# Patient Record
Sex: Male | Born: 1935
Health system: Southern US, Community
[De-identification: ages and names within clinical notes are randomized; demographics above are authoritative.]

## PROBLEM LIST (undated history)

## (undated) DIAGNOSIS — E119 Type 2 diabetes mellitus without complications: Secondary | ICD-10-CM

## (undated) DIAGNOSIS — I499 Cardiac arrhythmia, unspecified: Secondary | ICD-10-CM

## (undated) DIAGNOSIS — R7303 Prediabetes: Secondary | ICD-10-CM

## (undated) DIAGNOSIS — E039 Hypothyroidism, unspecified: Secondary | ICD-10-CM

## (undated) DIAGNOSIS — M1990 Unspecified osteoarthritis, unspecified site: Secondary | ICD-10-CM

## (undated) DIAGNOSIS — I10 Essential (primary) hypertension: Secondary | ICD-10-CM

## (undated) DIAGNOSIS — E785 Hyperlipidemia, unspecified: Secondary | ICD-10-CM

## (undated) DIAGNOSIS — I509 Heart failure, unspecified: Secondary | ICD-10-CM

## (undated) DIAGNOSIS — I739 Peripheral vascular disease, unspecified: Secondary | ICD-10-CM

## (undated) DIAGNOSIS — G4730 Sleep apnea, unspecified: Secondary | ICD-10-CM

## (undated) DIAGNOSIS — N189 Chronic kidney disease, unspecified: Secondary | ICD-10-CM

## (undated) DIAGNOSIS — I219 Acute myocardial infarction, unspecified: Secondary | ICD-10-CM

## (undated) DIAGNOSIS — H409 Unspecified glaucoma: Secondary | ICD-10-CM

## (undated) HISTORY — DX: Peripheral vascular disease, unspecified: I73.9

## (undated) HISTORY — PX: HERNIA REPAIR: SHX51

## (undated) HISTORY — DX: Essential (primary) hypertension: I10

## (undated) HISTORY — DX: Hyperlipidemia, unspecified: E78.5

---
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
## 2000-12-15 ENCOUNTER — Ambulatory Visit (HOSPITAL_COMMUNITY): Admission: RE | Admit: 2000-12-15 | Discharge: 2000-12-15 | Payer: Self-pay | Admitting: General Surgery

## 2001-10-01 ENCOUNTER — Encounter: Payer: Self-pay | Admitting: Urology

## 2001-10-01 ENCOUNTER — Encounter: Admission: RE | Admit: 2001-10-01 | Discharge: 2001-10-01 | Payer: Self-pay | Admitting: Urology

## 2001-10-07 ENCOUNTER — Encounter (INDEPENDENT_AMBULATORY_CARE_PROVIDER_SITE_OTHER): Payer: Self-pay | Admitting: Specialist

## 2001-10-07 ENCOUNTER — Ambulatory Visit (HOSPITAL_BASED_OUTPATIENT_CLINIC_OR_DEPARTMENT_OTHER): Admission: RE | Admit: 2001-10-07 | Discharge: 2001-10-07 | Payer: Self-pay | Admitting: Urology

## 2002-07-14 ENCOUNTER — Other Ambulatory Visit: Admission: RE | Admit: 2002-07-14 | Discharge: 2002-07-14 | Payer: Self-pay | Admitting: Dermatology

## 2004-05-04 ENCOUNTER — Emergency Department: Payer: Self-pay | Admitting: Unknown Physician Specialty

## 2004-05-09 ENCOUNTER — Observation Stay (HOSPITAL_COMMUNITY): Admission: RE | Admit: 2004-05-09 | Discharge: 2004-05-10 | Payer: Self-pay | Admitting: Orthopedic Surgery

## 2004-06-19 ENCOUNTER — Encounter: Admission: RE | Admit: 2004-06-19 | Discharge: 2004-08-09 | Payer: Self-pay | Admitting: Orthopedic Surgery

## 2005-09-18 ENCOUNTER — Ambulatory Visit (HOSPITAL_BASED_OUTPATIENT_CLINIC_OR_DEPARTMENT_OTHER): Admission: RE | Admit: 2005-09-18 | Discharge: 2005-09-18 | Payer: Self-pay | Admitting: Urology

## 2005-12-26 ENCOUNTER — Ambulatory Visit (HOSPITAL_COMMUNITY): Admission: RE | Admit: 2005-12-26 | Discharge: 2005-12-26 | Payer: Self-pay | Admitting: *Deleted

## 2006-01-21 HISTORY — PX: US ECHOCARDIOGRAPHY: HXRAD669

## 2007-02-10 ENCOUNTER — Ambulatory Visit (HOSPITAL_COMMUNITY): Admission: RE | Admit: 2007-02-10 | Discharge: 2007-02-10 | Payer: Self-pay | Admitting: General Surgery

## 2007-10-28 ENCOUNTER — Ambulatory Visit (HOSPITAL_COMMUNITY): Admission: RE | Admit: 2007-10-28 | Discharge: 2007-10-28 | Payer: Self-pay | Admitting: Family Medicine

## 2010-01-13 ENCOUNTER — Emergency Department (HOSPITAL_COMMUNITY)
Admission: EM | Admit: 2010-01-13 | Discharge: 2010-01-13 | Payer: Self-pay | Source: Home / Self Care | Admitting: Emergency Medicine

## 2010-01-14 HISTORY — PX: CATARACT EXTRACTION: SUR2

## 2010-03-26 LAB — COMPREHENSIVE METABOLIC PANEL
ALT: 19 U/L (ref 0–53)
AST: 44 U/L — ABNORMAL HIGH (ref 0–37)
Albumin: 2.2 g/dL — ABNORMAL LOW (ref 3.5–5.2)
Alkaline Phosphatase: 144 U/L — ABNORMAL HIGH (ref 39–117)
BUN: 33 mg/dL — ABNORMAL HIGH (ref 6–23)
CO2: 34 mEq/L — ABNORMAL HIGH (ref 19–32)
Calcium: 8.6 mg/dL (ref 8.4–10.5)
Chloride: 93 mEq/L — ABNORMAL LOW (ref 96–112)
Creatinine, Ser: 1.66 mg/dL — ABNORMAL HIGH (ref 0.4–1.5)
GFR calc Af Amer: 49 mL/min — ABNORMAL LOW (ref >60)
GFR calc non Af Amer: 41 mL/min — ABNORMAL LOW (ref >60)
Glucose, Bld: 130 mg/dL — ABNORMAL HIGH (ref 70–99)
Potassium: 2.6 mEq/L — CL (ref 3.5–5.1)
Sodium: 137 mEq/L (ref 135–145)
Total Bilirubin: 2.2 mg/dL — ABNORMAL HIGH (ref 0.3–1.2)
Total Protein: 7 g/dL (ref 6.0–8.3)

## 2010-03-26 LAB — DIFFERENTIAL
Basophils Absolute: 0 10*3/uL (ref 0.0–0.1)
Basophils Relative: 0 % (ref 0–1)
Eosinophils Absolute: 0.1 10*3/uL (ref 0.0–0.7)
Eosinophils Relative: 1 % (ref 0–5)
Lymphocytes Relative: 14 % (ref 12–46)
Lymphs Abs: 1.5 10*3/uL (ref 0.7–4.0)
Monocytes Absolute: 1 10*3/uL (ref 0.1–1.0)
Monocytes Relative: 9 % (ref 3–12)
Neutro Abs: 8 10*3/uL — ABNORMAL HIGH (ref 1.7–7.7)
Neutrophils Relative %: 76 % (ref 43–77)

## 2010-03-26 LAB — CBC
HCT: 40.6 % (ref 39.0–52.0)
Hemoglobin: 13.6 g/dL (ref 13.0–17.0)
MCH: 32.7 pg (ref 26.0–34.0)
MCHC: 33.5 g/dL (ref 30.0–36.0)
MCV: 97.6 fL (ref 78.0–100.0)
Platelets: 204 10*3/uL (ref 150–400)
RBC: 4.16 MIL/uL — ABNORMAL LOW (ref 4.22–5.81)
RDW: 17.3 % — ABNORMAL HIGH (ref 11.5–15.5)
WBC: 10.5 10*3/uL (ref 4.0–10.5)

## 2010-03-26 LAB — CROSSMATCH
ABO/RH(D): O POS
Antibody Screen: NEGATIVE
Unit division: 0
Unit division: 0

## 2010-03-26 LAB — PROTIME-INR
INR: 1.82 — ABNORMAL HIGH (ref 0.00–1.49)
Prothrombin Time: 21.2 seconds — ABNORMAL HIGH (ref 11.6–15.2)

## 2010-03-26 LAB — ABO/RH: ABO/RH(D): O POS

## 2010-05-29 NOTE — H&P (Signed)
NAME:  [name], [name] NO.:  [id number]   MEDICAL RECORD NO.:  [id number]           PATIENT TYPE:  AMB   LOCATION:  DAY                           FACILITY:  [facility]   PHYSICIAN:  Jamesetta So, M.D.  DATE OF BIRTH:  1927-09-14   DATE OF ADMISSION:  DATE OF DISCHARGE:  LH                              HISTORY & PHYSICAL   CHIEF COMPLAINT:  Hematochezia.   HISTORY OF PRESENT ILLNESS:  The patient is a 77 year old white male who  is referred for endoscopic evaluation.  He needs colonoscopy for  hematochezia.  Does feel some rectal pain and tearing with bowel  movements.  There is a question of whether he has an anal fissure.  No  abdominal pain, weight loss, nausea, vomiting, diarrhea, constipation or  melena had been noted.  He last had a colonoscopy in 2001.  There is no  family history of colon carcinoma.   PAST MEDICAL HISTORY:  Hypertension, BPH.   PAST SURGICAL HISTORY:  Herniorrhaphy, appendectomy, knee surgeries.   MEDICATIONS:  Simvastatin, ranitidine, baby aspirin, terazosin.   ALLERGIES:  IVP DYE.   REVIEW OF SYSTEMS:  The patient denies drinking or smoking.  He denies  any recent chest pain, MI, CVA or diabetes mellitus.   PHYSICAL EXAMINATION:  GENERAL:  The patient is a well-developed, well-  nourished white male in no acute distress.  LUNGS:  Clear to auscultation with good breath sounds bilaterally.  HEART:  Reveals regular rate and rhythm without S3, S4 or murmurs.  ABDOMEN:  Soft, nontender, nondistended.  No hepatosplenomegaly or  masses are noted.  RECTAL:  Deferred to the procedure.   IMPRESSION:  Hematochezia.   PLAN:  The patient is scheduled for a colonoscopy on February 03, 2007.  The risks and benefits of the procedure including bleeding and  perforation were fully explained to the patient who gave informed  consent.      Jamesetta So, M.D.  Electronically Signed     MAJ/MEDQ  D:  01/22/2007  T:  01/22/2007  Job:   [id number]   cc:   Bonne Dolores, M.D.  Fax: [phone]

## 2010-06-01 NOTE — Op Note (Signed)
NAME:  [name], SEEBER NO.:  [id number]   MEDICAL RECORD NO.:  [id number]          PATIENT TYPE:  AMB   LOCATION:  DAY                          FACILITY:  [facility]   PHYSICIAN:  Gaynelle Arabian, M.D.    DATE OF BIRTH:  [birth year]   DATE OF PROCEDURE:  05/09/2004  DATE OF DISCHARGE:                                 OPERATIVE REPORT   PREOPERATIVE DIAGNOSIS:  Left quadriceps tendon rupture.   POSTOPERATIVE DIAGNOSIS:  Left quadriceps tendon rupture.   PROCEDURE:  Left quadriceps tendon repair.   SURGEON:  Gaynelle Arabian, M.D.   ASSISTANT:  Arlee Muslim, PA-C.   ANESTHESIA:  General.   ESTIMATED BLOOD LOSS:  Minimal.   DRAINS:  None.   TOURNIQUET TIME:  Thirty-nine minutes at 300 mmHg.   COMPLICATIONS:  None.   CONDITION:  Stable to the recovery room.   CLINICAL NOTE:  [name] is a 77 year old male who accidentally stepped in a  hole about five days ago, sustaining a left quadriceps tendon rupture.  I  saw him in the office yesterday, and he presents today for a quadriceps  tendon repair.   PROCEDURE IN DETAIL:  After successful administration of general anesthetic,  a tourniquet is placed high on the left thigh, and the left lower extremity  prepped and draped in the usual sterile fashion.  The extremity is wrapped  in esmarch, the knee flexed, and the tourniquet inflated to 300 mmHg.  A  standard midline incision was made with a 10 blade through the subcutaneous  tissue to the level of the extensor mechanism.  A complete quad rupture at  the tendo-osseous junction, between the quad tendon and the patella.  We  debrided abnormal-appearing tissue.  I then placed two #2 fiber wire sutures  in Bunnell fashion through the quad tendon to exit distally.  I drilled  three holes in the patella from superior to inferior and then passed the  sutures through the holes.  We tightened down the sutures and tied them to  give a very stable quadriceps repair.  I was able  to give him full flexion  without any gapping in the repair.  We had thoroughly irrigated the joint  prior to tying these down.  The medial and lateral retinaculums were then  closed with interrupted Ethibond suture.  Again, flexion against gravity and  he had full flexion with no gapping of the repair.  We then let the  tourniquet down for a total time of 39 minutes.  The subcu was closed with  interrupted  2-0 Vicryl and the subcuticular with a running 4-0 Monocryl.  The incision  was cleaned and dried.  Steri-Strips and a bulky sterile dressing applied.  He was subsequently awakened and transported to the recovery room in stable  condition.      FA/MEDQ  D:  05/09/2004  T:  05/09/2004  Job:  [id number]

## 2010-06-01 NOTE — Op Note (Signed)
NAME:  [name], [name] NO.:  [id number]   MEDICAL RECORD NO.:  [id number]          PATIENT TYPE:  AMB   LOCATION:  [facility]                         FACILITY:  [facility]   PHYSICIAN:  Lucina Mellow. Terance Hart, M.D.DATE OF BIRTH:  08-18-1927   DATE OF PROCEDURE:  09/18/2005  DATE OF DISCHARGE:                                 OPERATIVE REPORT   PREOPERATIVE DIAGNOSIS:  Urethral stricture and benign prostatic  hypertrophy.   POSTOPERATIVE DIAGNOSIS:  Urethral stricture and benign prostatic  hypertrophy.   PROCEDURES:  Cystoscopy and urethral dilation with filiforms and followers.   SURGEON:  Dr. Hessie Diener   ANESTHESIA:  General.   INDICATIONS:  This gentleman has bladder obstructive symptoms and has been  on the therapy with terazosin and still has significant voiding symptoms.  Office cystoscopy revealed an impassable bulbous urethral stricture.  He is  brought to the OR today for therapy under anesthesia.   PROCEDURE:  The patient was brought to operating room and placed in  lithotomy position.  External genitalia were prepped and draped in usual  fashion.  He was cystoscoped and the anterior urethra was normal until I got  to the bulbous region when there was a small pinpoint stricture.  Utilizing  the cystoscope, I passed a metal guidewire through this strictured area and  over the metal guidewire passed came [facility] dilators from 16-French to 12-  [place].  I then cystoscoped him and fortunately the bulbous urethral  stricture was fairly short and he had bilobar hypertrophy of the prostate  there would be amenable to TUR of prostate if needed and the bladder had  trabeculation but no stones, tumors or inflammatory lesions.  Th scope was  then removed.  The patient was sent to the recovery room in good condition  having tolerated the procedure well.      Lucina Mellow. Terance Hart, M.D.  Electronically Signed     LJP/MEDQ  D:  09/18/2005  T:  09/18/2005  Job:  [id number]

## 2010-06-01 NOTE — Op Note (Signed)
   NAME:  [name], [name] NO.:  [id number]   MEDICAL RECORD NO.:  [id number]                   PATIENT TYPE:  AMB   LOCATION:  [facility]                                 FACILITY:  [facility]   PHYSICIAN:  Lucina Mellow. Terance Hart, M.D.             DATE OF BIRTH:  12/02/1927   DATE OF PROCEDURE:  10/07/2001  DATE OF DISCHARGE:                                 OPERATIVE REPORT   PREOPERATIVE DIAGNOSIS:  Rule out carcinoma in situ of the bladder.   POSTOPERATIVE DIAGNOSES:  1. Rule out carcinoma in situ of the bladder.  2. Bulbous urethral stricture.   PROCEDURE:  1. Cystoscopy.  2. Urethral dilation.  3. Cold cup bladder biopsy and fulguration.   SURGEON:  Lucina Mellow. Terance Hart, M.D.   ANESTHESIA:  General.   INDICATIONS:  This 77 year old gentleman had some bloody urethral discharge,  and cystoscopy in the office revealed a couple of erythematous areas  worrisome for CIS.  He is brought to the OR today for further treatment and  evaluation.   DESCRIPTION OF PROCEDURE:  The patient was brought to operating room and  placed in lithotomy position.  The external genitalia were prepped and  draped in the usual fashion.  He was cystoscoped, and he had a mild bulbous  urethral stricture which I needed to dilate with Leander Rams sound before I  can insert the 25 [place] cystoscope.  The prostate was unremarkable with  partial obstruction.  The bladder had mild trabeculation and an erythematous  area on the anterior wall and on the right upper wall.  They were both about  1.5 cm in size.  The cold  cup bladder biopsy forceps was utilized to biopsy  both areas and sent separately to pathology.  The biopsy sites and residual  erythematous mucosa in both regions were then fulgurated with the Bugbee  electrode.  At this point, there was good hemostasis and negligible blood  loss and, as the scope was removed, the bulbous urethral stricture was  noted.  He was then taken to the  recovery room in good condition having  tolerated the procedure well.                                               Lucina Mellow. Terance Hart, M.D.    LJP/MEDQ  D:  10/07/2001  T:  10/07/2001  Job:  [id number]

## 2010-06-05 HISTORY — PX: NM MYOCAR PERF WALL MOTION: HXRAD629

## 2010-06-08 ENCOUNTER — Encounter (HOSPITAL_COMMUNITY): Payer: Self-pay

## 2010-06-08 ENCOUNTER — Ambulatory Visit (HOSPITAL_COMMUNITY)
Admission: RE | Admit: 2010-06-08 | Discharge: 2010-06-08 | Disposition: A | Discharge status: Home / Self Care | Payer: Medicare Other | Source: Ambulatory Visit | Attending: Cardiovascular Disease | Admitting: Cardiovascular Disease

## 2010-06-08 ENCOUNTER — Other Ambulatory Visit (HOSPITAL_COMMUNITY): Payer: Self-pay | Admitting: Cardiovascular Disease

## 2010-06-08 DIAGNOSIS — Z01810 Encounter for preprocedural cardiovascular examination: Secondary | ICD-10-CM

## 2010-06-08 DIAGNOSIS — R0602 Shortness of breath: Secondary | ICD-10-CM | POA: Insufficient documentation

## 2010-06-14 ENCOUNTER — Ambulatory Visit (HOSPITAL_COMMUNITY)
Admission: RE | Admit: 2010-06-14 | Discharge: 2010-06-15 | Disposition: A | Discharge status: Home / Self Care | Payer: Medicare Other | Source: Ambulatory Visit | Attending: Cardiovascular Disease | Admitting: Cardiovascular Disease

## 2010-06-14 DIAGNOSIS — Z01812 Encounter for preprocedural laboratory examination: Secondary | ICD-10-CM | POA: Insufficient documentation

## 2010-06-14 DIAGNOSIS — Z01810 Encounter for preprocedural cardiovascular examination: Secondary | ICD-10-CM | POA: Insufficient documentation

## 2010-06-14 DIAGNOSIS — I701 Atherosclerosis of renal artery: Secondary | ICD-10-CM | POA: Insufficient documentation

## 2010-06-14 DIAGNOSIS — I70219 Atherosclerosis of native arteries of extremities with intermittent claudication, unspecified extremity: Secondary | ICD-10-CM | POA: Insufficient documentation

## 2010-06-14 DIAGNOSIS — I700 Atherosclerosis of aorta: Secondary | ICD-10-CM | POA: Insufficient documentation

## 2010-06-14 DIAGNOSIS — I10 Essential (primary) hypertension: Secondary | ICD-10-CM | POA: Insufficient documentation

## 2010-06-14 DIAGNOSIS — E785 Hyperlipidemia, unspecified: Secondary | ICD-10-CM | POA: Insufficient documentation

## 2010-06-14 HISTORY — PX: OTHER SURGICAL HISTORY: SHX169

## 2010-06-14 LAB — POCT ACTIVATED CLOTTING TIME: Activated Clotting Time: 211 seconds

## 2010-06-14 LAB — GLUCOSE, CAPILLARY
Glucose-Capillary: 120 mg/dL — ABNORMAL HIGH (ref 70–99)
Glucose-Capillary: 127 mg/dL — ABNORMAL HIGH (ref 70–99)
Glucose-Capillary: 167 mg/dL — ABNORMAL HIGH (ref 70–99)

## 2010-06-15 LAB — BASIC METABOLIC PANEL
BUN: 20 mg/dL (ref 6–23)
CO2: 27 mEq/L (ref 19–32)
Calcium: 8.9 mg/dL (ref 8.4–10.5)
Chloride: 103 mEq/L (ref 96–112)
Creatinine, Ser: 1.39 mg/dL (ref 0.4–1.5)
GFR calc Af Amer: 59 mL/min — ABNORMAL LOW (ref >60)
GFR calc non Af Amer: 49 mL/min — ABNORMAL LOW (ref >60)
Glucose, Bld: 136 mg/dL — ABNORMAL HIGH (ref 70–99)
Potassium: 4 mEq/L (ref 3.5–5.1)
Sodium: 137 mEq/L (ref 135–145)

## 2010-06-15 LAB — CBC
HCT: 36.1 % — ABNORMAL LOW (ref 39.0–52.0)
Hemoglobin: 12.7 g/dL — ABNORMAL LOW (ref 13.0–17.0)
MCH: 33.2 pg (ref 26.0–34.0)
MCHC: 35.2 g/dL (ref 30.0–36.0)
MCV: 94.3 fL (ref 78.0–100.0)
Platelets: 166 10*3/uL (ref 150–400)
RBC: 3.83 MIL/uL — ABNORMAL LOW (ref 4.22–5.81)
RDW: 12.8 % (ref 11.5–15.5)
WBC: 8.4 10*3/uL (ref 4.0–10.5)

## 2010-06-15 LAB — POCT ACTIVATED CLOTTING TIME: Activated Clotting Time: 175 seconds

## 2010-06-15 LAB — GLUCOSE, CAPILLARY: Glucose-Capillary: 141 mg/dL — ABNORMAL HIGH (ref 70–99)

## 2010-06-18 LAB — GLUCOSE, CAPILLARY: Glucose-Capillary: 125 mg/dL — ABNORMAL HIGH (ref 70–99)

## 2010-06-27 NOTE — Procedures (Signed)
NAME:  [name], [name] NO.:  [id number]  MEDICAL RECORD NO.:  [id number]           PATIENT TYPE:  O  LOCATION:  6525                         FACILITY:  [facility]  PHYSICIAN:  [name], M.D.   DATE OF BIRTH:  08/20/1927  DATE OF PROCEDURE: DATE OF DISCHARGE:                   PERIPHERAL VASCULAR INVASIVE PROCEDURE   Abdominal aortogram with bifemoral runoff, selective right and left renal artery angiogram, PTA and stent left SFA.  [name] is an 77 year old gentleman referred by Dr. Debara Pickett for angiography and potential intervention because of symptomatic claudication.  His risk factors include hypertension and hyperlipidemia. He did have a negative Myoview.  Dopplers performed in our office revealed ABIs in the 0.6 range bilaterally with high-grade distal SFA stenosis and severe tibial disease.  PROCEDURE DESCRIPTION:  The patient was brought to the second floor of [facility] in the postabsorptive state.  He was premedicated with p.o. Valium, IV Versed and fentanyl.  His right groin was prepped and shaved in the usual sterile fashion.  Xylocaine 1% was used for local anesthesia.  A 5-French sheath was inserted into the right femoral artery using standard Seldinger technique.  A 5-French pigtail catheter was used for abdominal aortography with bifemoral runoff using bolus-chase digital subtraction step-table technique.  A 5- French short right Judkins catheter was used for selective right and left renal artery angiography.  Visipaque dye was used for the entirety of the case.  Retrograde aortic pressure was monitored at the end of the case.  ANGIOGRAPHIC RESULT: 1. Abdominal aorta.     a.     Renal arteries - 30% to 40% bilateral ostial renal artery      stenosis.     b.     Infrarenal abdominal aorta - mild atherosclerotic changes. 2. Left lower extremity.     a.     A 90% focal mid to distal left SFA stenosis.     b.     A  zero-vessel runoff.  The peroneal appeared to be the      dominant vessel and there was a short segment of occlusion filled      by collaterals. 3. Right lower extremity.     a.     A zero-vessel runoff below the knee.  PROCEDURE DESCRIPTION:  Contralateral access was obtained with a crossover catheter, 0.035 Rosen wire, and a 7-French Ansel crossover sheath.  The patient received 5000 units of heparin intravenously with an ACT of 211 at the end of the case.  Visipaque dye was used for the entirety of the case.  Retrograde aortic pressure was monitored at the end of the case.  A 0.035 Versacore wire was used to cross the lesion.  Predilatation was performed with a 4 x 2 balloon, stenting with a 7 x 3 Abbott Absolute Pro nitinol self-expanding stent.  Postdilatation with a 6 x 3 balloon resulting in reduction of 90% focal mid to distal left SFA stenosis to 0% residual without dissection.  The patient tolerated the procedure well.  Total contrast used during the case was 244.  The sheath was withdrawn across the bifurcation.  The patient  left the lab in stable condition.  Sheath will be removed once the ACT falls below 170.  The patient will hydrated overnight and discharged home in the morning.  We will get followup Dopplers and will see Dr. Debara Pickett back in followup.     [name], M.D.     Geralynn Rile  D:  06/14/2010  T:  06/15/2010  Job:  [id number]  cc:   [facility]. Mali Hilty, MD Sherrilee Gilles. Gerarda Fraction, MD  Electronically Signed by [name] M.D. on 06/27/2010 10:45:22 AM

## 2010-06-27 NOTE — Discharge Summary (Signed)
  NAME:  [name], [name] NO.:  [id number]  MEDICAL RECORD NO.:  [id number]           PATIENT TYPE:  O  LOCATION:  6525                         FACILITY:  [facility]  PHYSICIAN:  [name], M.D.   DATE OF BIRTH:  01/02/1928  DATE OF ADMISSION:  06/14/2010 DATE OF DISCHARGE:  06/15/2010                              DISCHARGE SUMMARY   DISCHARGE DIAGNOSES: 1. Claudication without improvement with medical therapy. 2. Abnormal ABIs. 3. Peripheral arterial disease with 90% left superficial femoral     artery stenosis undergoing PTA and stent with good results. 4. Renal artery stenosis with bilateral 30-40% stenosis.  DISCHARGE CONDITION:  Stable.  PROCEDURES: 1. PV angiogram Jun 14, 2010 by Dr. Gwenlyn Found. 2. Jun 14, 2010, PTA and stent to the left SFA by Dr. Gwenlyn Found.  Right     lower extremity had 0 vessel runoff below the knee.  PAST MEDICAL HISTORY:  Hypertension and BPH.  PAST SURGICAL HISTORY:  Herniorrhaphy, appendectomy, and knee surgery.  The patient was seen by Dr. Debara Pickett with complaints of claudication.  ABIs were abnormal with 0.61 on the left and 0.65 on the right demonstrated occlusive arterial disease.  Pletal was tried but did not help with claudication at all.  PV angiogram was recommended as he had failed medical therapy.  The patient underwent PV angiogram with results as stated previously.  By June 15, 2010, she is stable.  Does have some numbness, tingling in his feet bilaterally.  Plans are to have him ambulate in the hall.  If he remained stable, we will discharge home and follow up as an outpatient initially with Dr. Gwenlyn Found and then with Dr. Debara Pickett as his routine cardiology physician.  DISCHARGE INSTRUCTIONS: 1. Increase activity slowly.  May shower.  No lifting for 1 week.  No     driving for 2 days. 2. Heart-healthy diet. 3. Wash cath site with soap and water.  Call if any bleeding,     swelling, or drainage. 4. Follow up with Dr. Gwenlyn Found in  [place], July 03, 2010 at 3:30. 5. Cancelled appointment in [place] with Dr. Debara Pickett. 6. You are scheduled for followup Doppler's June 25, 2010 at 10:00     a.m. in [place].  After these 2 appointments, she will then     follow up with Dr. Debara Pickett.  DISCHARGE MEDICATIONS:  See medication reconciliation sheet.  No medications were changed as the patient was already on Plavix.  He had already been discontinued his Pletal and we continued these.     Otilio Carpen. Dorene Ar, N.P.   ______________________________ [name], M.D.    LRI/MEDQ  D:  06/15/2010  T:  06/15/2010  Job:  [id number]  cc:   [name], M.D. Sherrilee Gilles. Gerarda Fraction, MD Mali Hilty, MD  Electronically Signed by Cecilie Kicks N.P. on 06/23/2010 03:21:43 PM Electronically Signed by [name] M.D. on 06/27/2010 10:45:25 AM

## 2011-12-11 ENCOUNTER — Other Ambulatory Visit (HOSPITAL_COMMUNITY): Payer: Self-pay | Admitting: Nephrology

## 2011-12-11 DIAGNOSIS — N289 Disorder of kidney and ureter, unspecified: Secondary | ICD-10-CM

## 2011-12-16 ENCOUNTER — Ambulatory Visit (HOSPITAL_COMMUNITY)
Admission: RE | Admit: 2011-12-16 | Discharge: 2011-12-16 | Disposition: A | Discharge status: Home / Self Care | Payer: Medicare Other | Source: Ambulatory Visit | Attending: Nephrology | Admitting: Nephrology

## 2011-12-16 DIAGNOSIS — E119 Type 2 diabetes mellitus without complications: Secondary | ICD-10-CM | POA: Insufficient documentation

## 2011-12-16 DIAGNOSIS — N289 Disorder of kidney and ureter, unspecified: Secondary | ICD-10-CM

## 2011-12-16 DIAGNOSIS — N189 Chronic kidney disease, unspecified: Secondary | ICD-10-CM | POA: Insufficient documentation

## 2012-01-07 ENCOUNTER — Ambulatory Visit (INDEPENDENT_AMBULATORY_CARE_PROVIDER_SITE_OTHER): Payer: Medicare Other | Admitting: General Surgery

## 2012-02-26 ENCOUNTER — Other Ambulatory Visit (HOSPITAL_COMMUNITY): Payer: Self-pay | Admitting: Cardiovascular Disease

## 2012-02-26 DIAGNOSIS — I779 Disorder of arteries and arterioles, unspecified: Secondary | ICD-10-CM

## 2012-02-26 DIAGNOSIS — I70219 Atherosclerosis of native arteries of extremities with intermittent claudication, unspecified extremity: Secondary | ICD-10-CM

## 2012-03-19 ENCOUNTER — Encounter (HOSPITAL_COMMUNITY): Payer: Medicare Other

## 2012-04-10 ENCOUNTER — Encounter (HOSPITAL_COMMUNITY): Payer: Medicare Other

## 2012-04-17 ENCOUNTER — Ambulatory Visit (HOSPITAL_COMMUNITY)
Admission: RE | Admit: 2012-04-17 | Discharge: 2012-04-17 | Disposition: A | Discharge status: Home / Self Care | Payer: Medicare Other | Source: Ambulatory Visit | Attending: Cardiovascular Disease | Admitting: Cardiovascular Disease

## 2012-04-17 DIAGNOSIS — I70219 Atherosclerosis of native arteries of extremities with intermittent claudication, unspecified extremity: Secondary | ICD-10-CM

## 2012-04-17 DIAGNOSIS — I779 Disorder of arteries and arterioles, unspecified: Secondary | ICD-10-CM

## 2012-04-17 NOTE — Progress Notes (Signed)
Lower extremity arterial complete.  [place]

## 2012-06-24 ENCOUNTER — Other Ambulatory Visit: Payer: Self-pay | Admitting: Cardiology

## 2012-06-26 ENCOUNTER — Other Ambulatory Visit: Payer: Self-pay | Admitting: *Deleted

## 2012-06-26 MED ORDER — ATORVASTATIN CALCIUM 40 MG PO TABS: 40.0000 mg | ORAL_TABLET | Freq: Every day | ORAL | Status: DC

## 2012-08-03 ENCOUNTER — Ambulatory Visit: Payer: Medicare Other | Admitting: Internal Medicine

## 2012-08-04 ENCOUNTER — Ambulatory Visit (INDEPENDENT_AMBULATORY_CARE_PROVIDER_SITE_OTHER): Payer: Medicare Other | Admitting: Internal Medicine

## 2012-08-04 ENCOUNTER — Encounter: Payer: Self-pay | Admitting: Internal Medicine

## 2012-08-04 VITALS — BP 140/72 | HR 47 | Ht 71.5 in | Wt 230.9 lb

## 2012-08-04 DIAGNOSIS — R42 Dizziness and giddiness: Secondary | ICD-10-CM | POA: Insufficient documentation

## 2012-08-04 DIAGNOSIS — I10 Essential (primary) hypertension: Secondary | ICD-10-CM

## 2012-08-04 DIAGNOSIS — I739 Peripheral vascular disease, unspecified: Secondary | ICD-10-CM

## 2012-08-04 DIAGNOSIS — E785 Hyperlipidemia, unspecified: Secondary | ICD-10-CM

## 2012-08-04 NOTE — Progress Notes (Signed)
OFFICE NOTE  Chief Complaint:  Routine follow-up  Primary Care Physician: Glo Herring., MD  HPI:  [name]  is an 79 year old gentleman with history of hypertension, dyslipidemia and peripheral vascular disease. He did have claudication and in May 2012 he had high-grade mid SFA stenosis. He had a stent placed in his SFA which improved his claudication and his Doppler studies. In December 2012 his left ABI was 0.61, the right ABI 0.74 with a patent stent. He was seen back by Dr. Gwenlyn Found and recommended followup with me in the office today. His only complaints now are about occasional dizziness especially with change in position, also leg pain with walking but not claudication, mostly joint pain, hip pain which could represent arthritis, wearing glasses.   PMHx:  Past Medical History  Diagnosis Date  . PVD (peripheral vascular disease)   . Hypertension   . Hyperlipidemia     Past Surgical History  Procedure Laterality Date  . Hernia repair    . Appendectomy  1943  . Cataract extraction  2012    x2  . Stent  06/14/2010    left leg    FAMHx:  Family History  Problem Relation Age of Onset  . Cancer Mother   . Heart attack Father   . Stroke Father   . Heart attack Brother   . Heart attack Brother     SOCHx:   reports that he quit smoking about 77 years ago. He has quit using smokeless tobacco. His smokeless tobacco use included Chew. He reports that he does not drink alcohol or use illicit drugs.  ALLERGIES:  Allergies  Allergen Reactions  . Iohexol      Code: RASH, Desc: PT STATES HOT ALL OVER HAD TO HAVE ICE PACKS ALL OVER HIM AND DIFF BREATHING, PT REFUSED IV DYE     ROS: A comprehensive review of systems was negative except for: Musculoskeletal: positive for stiff joints  HOME MEDS: Current Outpatient Prescriptions  Medication Sig Dispense Refill  . aspirin 81 MG tablet Take 81 mg by mouth daily.      [name] atorvastatin (LIPITOR) 40 MG tablet Take 1 tablet  (40 mg total) by mouth daily.  30 tablet  6  . cholecalciferol (VITAMIN D) 1000 UNITS tablet Take 1,000 Units by mouth daily.      . Cinnamon 500 MG TABS Take 2 tablets by mouth daily.      . dorzolamide-timolol (COSOPT) 22.3-6.8 MG/ML ophthalmic solution Place 1 drop into both eyes 2 (two) times daily.      . finasteride (PROSCAR) 5 MG tablet Take 5 mg by mouth daily.      . fish oil-omega-3 fatty acids 1000 MG capsule Take 2 g by mouth daily.      [name] glucosamine-chondroitin 500-400 MG tablet Take 1 tablet by mouth 2 (two) times daily.      [name] lisinopril (PRINIVIL,ZESTRIL) 10 MG tablet Take 10 mg by mouth daily.      . Methylcellulose, Laxative, (FIBER THERAPY PO) Take by mouth daily.      [name] pyridOXINE (VITAMIN B-6) 100 MG tablet Take 100 mg by mouth daily.      . ranitidine (ZANTAC) 150 MG tablet Take 150 mg by mouth 2 (two) times daily.      . tamsulosin (FLOMAX) 0.4 MG CAPS Take 0.4 mg by mouth daily.      . vitamin C (ASCORBIC ACID) 500 MG tablet Take 500 mg by mouth daily.      [name]  vitamin E 400 UNIT capsule Take 400 Units by mouth daily.      [name] zinc gluconate 50 MG tablet Take 50 mg by mouth daily.      . clopidogrel (PLAVIX) 75 MG tablet take 1 tablet by mouth once daily  30 tablet  6   No current facility-administered medications for this visit.    LABS/IMAGING: No results found for this or any previous visit (from the past 48 hour(s)). No results found.  VITALS: BP 140/72  Pulse 47  Ht 5' 11.5" (1.816 m)  Wt 230 lb 14.4 oz (104.736 kg)  BMI 31.76 kg/m2  EXAM: General appearance: alert and no distress Neck: no adenopathy, no carotid bruit, no JVD, supple, symmetrical, trachea midline and thyroid not enlarged, symmetric, no tenderness/mass/nodules Lungs: clear to auscultation bilaterally Heart: regular rate and rhythm, S1, S2 normal, no murmur, click, rub or gallop Abdomen: soft, non-tender; bowel sounds normal; no masses,  no organomegaly Extremities: extremities normal,  atraumatic, no cyanosis or edema Pulses: 1+ bilaterally Skin: Skin color, texture, turgor normal. No rashes or lesions Neurologic: Grossly normal  EKG: Sinus bradycardia 47  ASSESSMENT: 1. PAD status post right SFA stent 2. Hypertension 3. Dizziness 4. Dyslipidemia   PLAN: 1.   Mr. Ayson is doing well with regards to his PAD. He did have repeat Dopplers in April 2014 which showed a moderately reduced ABI of the right around 0.6 and a mild to moderately reduced ABI on the left around 0.8.  He denies any claudication, but does get some mild shortness of breath walking up stairs and has pain mostly in his right hip. I didn't think this is an orthopedic problem and have recommended he sees an orthopedist for it. Continue current medications and we'll see him back in a year.  Pixie Casino, MD, [facility] Attending Cardiologist [facility] 08/04/2012, 1:35 PM

## 2012-08-04 NOTE — Patient Instructions (Addendum)
Your physician recommends that you return for lab work in: 1-2 weeks. You will need to be fasting for this bloodwork.  NMR with lipids.  Your physician wants you to follow-up in: 1 year. You will receive a reminder letter in the mail two months in advance. If you don't receive a letter, please call our office to schedule the follow-up appointment.

## 2012-08-07 LAB — NMR LIPOPROFILE WITH LIPIDS
Cholesterol, Total: 99 mg/dL (ref <200)
HDL Particle Number: 26.3 umol/L — ABNORMAL LOW (ref >=30.5)
HDL Size: 8.8 nm — ABNORMAL LOW (ref >=9.2)
HDL-C: 39 mg/dL — ABNORMAL LOW (ref >=40)
LDL (calc): 42 mg/dL (ref <100)
LDL Particle Number: 614 nmol/L (ref <1000)
LDL Size: 20.6 nm (ref >20.5)
LP-IR Score: 64 — ABNORMAL HIGH (ref <=45)
Large HDL-P: 3.6 umol/L — ABNORMAL LOW (ref >=4.8)
Large VLDL-P: 2.8 nmol/L — ABNORMAL HIGH (ref <=2.7)
Small LDL Particle Number: 229 nmol/L (ref <=527)
Triglycerides: 89 mg/dL (ref <150)
VLDL Size: 53.8 nm — ABNORMAL HIGH (ref <=46.6)

## 2012-08-26 ENCOUNTER — Other Ambulatory Visit: Payer: Self-pay

## 2012-08-26 MED ORDER — LISINOPRIL 10 MG PO TABS: 10.0000 mg | ORAL_TABLET | Freq: Every day | ORAL | Status: DC

## 2012-08-26 NOTE — Telephone Encounter (Signed)
Rx was sent to pharmacy electronically. 

## 2013-02-06 ENCOUNTER — Other Ambulatory Visit: Payer: Self-pay | Admitting: Cardiovascular Disease

## 2013-02-08 ENCOUNTER — Other Ambulatory Visit: Payer: Self-pay

## 2013-02-08 MED ORDER — CLOPIDOGREL BISULFATE 75 MG PO TABS: 75.0000 mg | ORAL_TABLET | Freq: Every day | ORAL | Status: DC

## 2013-02-08 NOTE — Telephone Encounter (Signed)
Rx was sent to pharmacy electronically. 

## 2013-02-19 ENCOUNTER — Encounter (HOSPITAL_COMMUNITY): Payer: Self-pay | Admitting: Pharmacy Technician

## 2013-02-19 NOTE — H&P (Signed)
NTS SOAP Note  Vital Signs:  Vitals as of: 123XX123: Systolic Q000111Q: Diastolic 83: Heart Rate 65: Temp 97.8F: Height 59ft 11in: Weight 229Lbs 0 Ounces: Pain Level 4: BMI 31.94  BMI : 31.94 kg/m2  Subjective: This 80 Years 24 Months old Male presents for of a hernia.  Has been having intermittent right groin pain and swelling for the past six months.  Made worse with straining.  No nausea, vomiting noted.  Review of Symptoms:  Constitutional:unremarkable   Head:unremarkable    Eyes:unremarkable   Nose/Mouth/Throat:unremarkable Cardiovascular:  unremarkable   Respiratory:unremarkable   Gastrointestin    heartburn Genitourinary:unremarkable       joint pain Skin:unremarkable Hematolgic/Lymphatic:unremarkable     Allergic/Immunologic:unremarkable     Past Medical History:    Reviewed   Past Medical History  Surgical History: hernia, appendectomy Medical Problems: HTN, high cholesterol, reflux, NIDDM Allergies: iv contrast Medications: tamsulosum, clopidogrel, atorvastatin, ranitidine, finasteride, glipizide, baby asa   Social History:Reviewed  Social History  Preferred Language: English Race:  White Ethnicity: Not Hispanic / Latino Age: 80 Years 5 Months Marital Status:  M Alcohol: no Recreational drug(s): no   Smoking Status: Never smoker reviewed on 01/26/2013 Functional Status reviewed on 01/26/2013 ------------------------------------------------ Bathing: Normal Cooking: Normal Dressing: Normal Driving: Normal Eating: Normal Managing Meds: Normal Oral Care: Normal Shopping: Normal Toileting: Normal Transferring: Normal Walking: Normal Cognitive Status reviewed on 01/26/2013 ------------------------------------------------ Attention: Normal Decision Making: Normal Language: Normal Memory: Normal Motor: Normal Perception: Normal Problem Solving: Normal Visual and Spatial: Normal   Family History:   Reviewed  Family Health History Family History is Unknown    Objective Information: General:  Well appearing, well nourished in no distress. Heart:  RRR, no murmur Lungs:    CTA bilaterally, no wheezes, rhonchi, rales.  Breathing unlabored. Abdomen:Soft, NT/ND, no HSM, no masses.  Reducible right inguinal hernia noted. [id number]    Assessment:Right inguinal hernia    Plan:  As patient is symptomatic, will need right inguinal herniorrhaphy with mesh.  Patient will call to scheduled.   Patient Education:Alternative treatments to surgery were discussed with patient (and family).  Risks and benefits  of procedure including bleeding, infection, mesh use, and recurrence of the hernia were fully explained to the patient (and family) who gave informed consent. Patient/family questions were addressed.  Follow-up:Pending Surgery

## 2013-02-22 ENCOUNTER — Encounter (HOSPITAL_COMMUNITY): Payer: Self-pay

## 2013-02-22 NOTE — Patient Instructions (Signed)
[name]  02/22/2013   Your procedure is scheduled on:  03/01/2013  Report to [facility] at  32  AM.  Call this number if you have problems the morning of surgery: [phone]   Remember:   Do not eat food or drink liquids after midnight.   Take these medicines the morning of surgery with A SIP OF WATER:  Proscar, zantac, flomax   Do not wear jewelry, make-up or nail polish.  Do not wear lotions, powders, or perfumes.   Do not shave 48 hours prior to surgery. Men may shave face and neck.  Do not bring valuables to the hospital.  [facility] is not responsible for any belongings or valuables.               Contacts, dentures or bridgework may not be worn into surgery.  Leave suitcase in the car. After surgery it may be brought to your room.  For patients admitted to the hospital, discharge time is determined by yourtreatment team.               Patients discharged the day of surgery will not be allowed to drive home.  Name and phone number of your driver: family  Special Instructions: Shower using CHG 2 nights before surgery and the night before surgery.  If you shower the day of surgery use CHG.  Use special wash - you have one bottle of CHG for all showers.  You should use approximately 1/3 of the bottle for each shower.   Please read over the following fact sheets that you were given: Pain Booklet, Coughing and Deep Breathing, Surgical Site Infection Prevention, Anesthesia Post-op Instructions and Care and Recovery After Surgery Hernia A hernia happens when an organ inside your body pushes out through a weak spot in your belly (abdominal) wall. Most hernias get worse over time. They can often be pushed back into place (reduced). Surgery may be needed to repair hernias that cannot be pushed into place. HOME CARE  Keep doing normal activities.  Avoid lifting more than 10 pounds (4.5 kilograms).  Cough gently and avoid straining. Over time, these things will:  Increase  your hernia size.  Irritate your hernia.  Break down hernia repairs.  Stop smoking.  Do not wear anything tight over your hernia. Do not keep the hernia in with an outside bandage.  Eat food that is high in fiber (fruit, vegetables, whole grains).  Drink enough fluids to keep your pee (urine) clear or pale yellow.  Take medicines to make your poop soft (stool softeners) if you cannot poop (constipated). GET HELP RIGHT AWAY IF:   You have a fever.  You have belly pain that gets worse.  You feel sick to your stomach (nauseous) and throw up (vomit).  Your skin starts to bulge out.  Your hernia turns a different color, feels hard, or is tender.  You have increased pain or puffiness (swelling) around the hernia.  You poop more or less often.  Your poop does not look the way normally does.  You have watery poop (diarrhea).  You cannot push the hernia back in place by applying gentle pressure while lying down. MAKE SURE YOU:   Understand these instructions.  Will watch your condition.  Will get help right away if you are not doing well or get worse. Document Released: 06/20/2009 Document Revised: 03/25/2011 Document Reviewed: 06/20/2009 [facility] Patient Information 2014 [place]. PATIENT INSTRUCTIONS POST-ANESTHESIA  IMMEDIATELY FOLLOWING  SURGERY:  Do not drive or operate machinery for the first twenty four hours after surgery.  Do not make any important decisions for twenty four hours after surgery or while taking narcotic pain medications or sedatives.  If you develop intractable nausea and vomiting or a severe headache please notify your doctor immediately.  FOLLOW-UP:  Please make an appointment with your surgeon as instructed. You do not need to follow up with anesthesia unless specifically instructed to do so.  WOUND CARE INSTRUCTIONS (if applicable):  Keep a dry clean dressing on the anesthesia/puncture wound site if there is drainage.  Once the wound has  quit draining you may leave it open to air.  Generally you should leave the bandage intact for twenty four hours unless there is drainage.  If the epidural site drains for more than 36-48 hours please call the anesthesia department.  QUESTIONS?:  Please feel free to call your physician or the hospital operator if you have any questions, and they will be happy to assist you.

## 2013-02-23 ENCOUNTER — Encounter (HOSPITAL_COMMUNITY)
Admission: RE | Admit: 2013-02-23 | Discharge: 2013-02-23 | Disposition: A | Discharge status: Home / Self Care | Payer: Medicare Other | Source: Ambulatory Visit | Attending: General Surgery | Admitting: General Surgery

## 2013-02-23 ENCOUNTER — Encounter (HOSPITAL_COMMUNITY): Payer: Self-pay

## 2013-02-23 ENCOUNTER — Other Ambulatory Visit: Payer: Self-pay

## 2013-02-23 DIAGNOSIS — Z01812 Encounter for preprocedural laboratory examination: Secondary | ICD-10-CM | POA: Insufficient documentation

## 2013-02-23 DIAGNOSIS — Z01810 Encounter for preprocedural cardiovascular examination: Secondary | ICD-10-CM | POA: Insufficient documentation

## 2013-02-23 HISTORY — DX: Prediabetes: R73.03

## 2013-02-23 HISTORY — DX: Unspecified glaucoma: H40.9

## 2013-02-23 HISTORY — DX: Unspecified osteoarthritis, unspecified site: M19.90

## 2013-02-23 HISTORY — DX: Sleep apnea, unspecified: G47.30

## 2013-02-23 LAB — CBC WITH DIFFERENTIAL/PLATELET
Basophils Absolute: 0 10*3/uL (ref 0.0–0.1)
Basophils Relative: 1 % (ref 0–1)
Eosinophils Absolute: 0.2 10*3/uL (ref 0.0–0.7)
Eosinophils Relative: 3 % (ref 0–5)
HCT: 38.8 % — ABNORMAL LOW (ref 39.0–52.0)
Hemoglobin: 13.5 g/dL (ref 13.0–17.0)
Lymphocytes Relative: 23 % (ref 12–46)
Lymphs Abs: 1.9 10*3/uL (ref 0.7–4.0)
MCH: 34.1 pg — ABNORMAL HIGH (ref 26.0–34.0)
MCHC: 34.8 g/dL (ref 30.0–36.0)
MCV: 98 fL (ref 78.0–100.0)
Monocytes Absolute: 0.7 10*3/uL (ref 0.1–1.0)
Monocytes Relative: 9 % (ref 3–12)
Neutro Abs: 5.6 10*3/uL (ref 1.7–7.7)
Neutrophils Relative %: 66 % (ref 43–77)
Platelets: 210 10*3/uL (ref 150–400)
RBC: 3.96 MIL/uL — ABNORMAL LOW (ref 4.22–5.81)
RDW: 13.2 % (ref 11.5–15.5)
WBC: 8.5 10*3/uL (ref 4.0–10.5)

## 2013-02-23 LAB — BASIC METABOLIC PANEL
BUN: 19 mg/dL (ref 6–23)
CO2: 29 mEq/L (ref 19–32)
Calcium: 9.8 mg/dL (ref 8.4–10.5)
Chloride: 99 mEq/L (ref 96–112)
Creatinine, Ser: 1.41 mg/dL — ABNORMAL HIGH (ref 0.50–1.35)
GFR calc Af Amer: 52 mL/min — ABNORMAL LOW (ref >90)
GFR calc non Af Amer: 44 mL/min — ABNORMAL LOW (ref >90)
Glucose, Bld: 160 mg/dL — ABNORMAL HIGH (ref 70–99)
Potassium: 4.3 mEq/L (ref 3.7–5.3)
Sodium: 139 mEq/L (ref 137–147)

## 2013-03-01 ENCOUNTER — Ambulatory Visit (HOSPITAL_COMMUNITY): Payer: Medicare Other | Admitting: Anesthesiology

## 2013-03-01 ENCOUNTER — Ambulatory Visit (HOSPITAL_COMMUNITY)
Admission: RE | Admit: 2013-03-01 | Discharge: 2013-03-01 | Disposition: A | Discharge status: Home / Self Care | Payer: Medicare Other | Source: Ambulatory Visit | Attending: General Surgery | Admitting: General Surgery

## 2013-03-01 ENCOUNTER — Encounter (HOSPITAL_COMMUNITY)
Admission: RE | Disposition: A | Discharge status: Home / Self Care | Payer: Self-pay | Source: Ambulatory Visit | Attending: General Surgery

## 2013-03-01 ENCOUNTER — Encounter (HOSPITAL_COMMUNITY): Payer: Self-pay | Admitting: Anesthesiology

## 2013-03-01 ENCOUNTER — Encounter (HOSPITAL_COMMUNITY): Payer: Medicare Other

## 2013-03-01 DIAGNOSIS — K4090 Unilateral inguinal hernia, without obstruction or gangrene, not specified as recurrent: Secondary | ICD-10-CM | POA: Insufficient documentation

## 2013-03-01 DIAGNOSIS — Z87891 Personal history of nicotine dependence: Secondary | ICD-10-CM | POA: Insufficient documentation

## 2013-03-01 DIAGNOSIS — I10 Essential (primary) hypertension: Secondary | ICD-10-CM | POA: Insufficient documentation

## 2013-03-01 DIAGNOSIS — Z79899 Other long term (current) drug therapy: Secondary | ICD-10-CM | POA: Insufficient documentation

## 2013-03-01 HISTORY — PX: INSERTION OF MESH: SHX5868

## 2013-03-01 HISTORY — PX: INGUINAL HERNIA REPAIR: SHX194

## 2013-03-01 LAB — GLUCOSE, CAPILLARY
Glucose-Capillary: 141 mg/dL — ABNORMAL HIGH (ref 70–99)
Glucose-Capillary: 135 mg/dL — ABNORMAL HIGH (ref 70–99)

## 2013-03-01 SURGERY — REPAIR, HERNIA, INGUINAL, ADULT
Anesthesia: Monitor Anesthesia Care | Site: Groin | Laterality: Right

## 2013-03-01 MED ORDER — FENTANYL CITRATE 0.05 MG/ML IJ SOLN
INTRAMUSCULAR | Status: AC
  Filled 2013-03-01: qty 2

## 2013-03-01 MED ORDER — FENTANYL CITRATE 0.05 MG/ML IJ SOLN
INTRAMUSCULAR | Status: DC | PRN
  Administered 2013-03-01: 20 ug via INTRATHECAL
  Administered 2013-03-01: 25 ug via INTRAVENOUS
  Administered 2013-03-01: 30 ug via INTRAVENOUS
  Administered 2013-03-01: 25 ug via INTRAVENOUS

## 2013-03-01 MED ORDER — SODIUM CHLORIDE 0.9 % IR SOLN
Status: DC | PRN
  Administered 2013-03-01: 1000 mL

## 2013-03-01 MED ORDER — EPHEDRINE SULFATE 50 MG/ML IJ SOLN
INTRAMUSCULAR | Status: DC | PRN
  Administered 2013-03-01: 10 mg via INTRAVENOUS

## 2013-03-01 MED ORDER — CEFAZOLIN SODIUM-DEXTROSE 2-3 GM-% IV SOLR
INTRAVENOUS | Status: AC
  Filled 2013-03-01: qty 50

## 2013-03-01 MED ORDER — MIDAZOLAM HCL 2 MG/2ML IJ SOLN
INTRAMUSCULAR | Status: AC
  Filled 2013-03-01: qty 2

## 2013-03-01 MED ORDER — ONDANSETRON HCL 4 MG/2ML IJ SOLN
INTRAMUSCULAR | Status: AC
  Filled 2013-03-01: qty 2

## 2013-03-01 MED ORDER — LIDOCAINE HCL (PF) 1 % IJ SOLN
INTRAMUSCULAR | Status: AC
  Filled 2013-03-01: qty 5

## 2013-03-01 MED ORDER — KETOROLAC TROMETHAMINE 30 MG/ML IJ SOLN
30.0000 mg | Freq: Once | INTRAMUSCULAR | Status: AC
  Administered 2013-03-01: 30 mg via INTRAVENOUS
  Filled 2013-03-01: qty 1

## 2013-03-01 MED ORDER — FENTANYL CITRATE 0.05 MG/ML IJ SOLN: 25.0000 ug | INTRAMUSCULAR | Status: DC | PRN

## 2013-03-01 MED ORDER — ARTIFICIAL TEARS OP OINT
TOPICAL_OINTMENT | OPHTHALMIC | Status: AC
  Filled 2013-03-01: qty 3.5

## 2013-03-01 MED ORDER — GLYCOPYRROLATE 0.2 MG/ML IJ SOLN
INTRAMUSCULAR | Status: AC
  Filled 2013-03-01: qty 1

## 2013-03-01 MED ORDER — LACTATED RINGERS IV SOLN
INTRAVENOUS | Status: DC
  Administered 2013-03-01: 1000 mL via INTRAVENOUS

## 2013-03-01 MED ORDER — ONDANSETRON HCL 4 MG/2ML IJ SOLN
4.0000 mg | Freq: Once | INTRAMUSCULAR | Status: DC | PRN
Start: 2013-03-01 — End: 2013-03-01

## 2013-03-01 MED ORDER — HYDROCODONE-ACETAMINOPHEN 5-325 MG PO TABS: 1.0000 | ORAL_TABLET | Freq: Four times a day (QID) | ORAL | Status: DC | PRN

## 2013-03-01 MED ORDER — PROPOFOL 10 MG/ML IV BOLUS
INTRAVENOUS | Status: AC
  Filled 2013-03-01: qty 20

## 2013-03-01 MED ORDER — LIDOCAINE HCL (CARDIAC) 10 MG/ML IV SOLN
INTRAVENOUS | Status: DC | PRN
  Administered 2013-03-01: 50 mg via INTRAVENOUS

## 2013-03-01 MED ORDER — BUPIVACAINE HCL (PF) 0.5 % IJ SOLN
INTRAMUSCULAR | Status: AC
  Filled 2013-03-01: qty 30

## 2013-03-01 MED ORDER — LACTATED RINGERS IV SOLN
INTRAVENOUS | Status: DC | PRN
  Administered 2013-03-01: 07:00:00 via INTRAVENOUS

## 2013-03-01 MED ORDER — FENTANYL CITRATE 0.05 MG/ML IJ SOLN
25.0000 ug | INTRAMUSCULAR | Status: AC
  Administered 2013-03-01 (×2): 25 ug via INTRAVENOUS

## 2013-03-01 MED ORDER — LIDOCAINE IN DEXTROSE 5-7.5 % IV SOLN
INTRAVENOUS | Status: DC | PRN
  Administered 2013-03-01: 2 mL via INTRATHECAL

## 2013-03-01 MED ORDER — ONDANSETRON HCL 4 MG/2ML IJ SOLN
4.0000 mg | Freq: Once | INTRAMUSCULAR | Status: AC
  Administered 2013-03-01: 4 mg via INTRAVENOUS

## 2013-03-01 MED ORDER — SODIUM CHLORIDE BACTERIOSTATIC 0.9 % IJ SOLN
INTRAMUSCULAR | Status: AC
  Filled 2013-03-01: qty 10

## 2013-03-01 MED ORDER — MIDAZOLAM HCL 2 MG/2ML IJ SOLN
1.0000 mg | INTRAMUSCULAR | Status: DC | PRN
  Administered 2013-03-01 (×2): 1 mg via INTRAVENOUS

## 2013-03-01 MED ORDER — CEFAZOLIN SODIUM-DEXTROSE 2-3 GM-% IV SOLR
2.0000 g | INTRAVENOUS | Status: AC
  Administered 2013-03-01: 2 g via INTRAVENOUS

## 2013-03-01 MED ORDER — GLYCOPYRROLATE 0.2 MG/ML IJ SOLN
0.2000 mg | Freq: Once | INTRAMUSCULAR | Status: AC
  Administered 2013-03-01: 0.2 mg via INTRAVENOUS

## 2013-03-01 MED ORDER — BUPIVACAINE HCL (PF) 0.5 % IJ SOLN
INTRAMUSCULAR | Status: DC | PRN
  Administered 2013-03-01: 10 mL

## 2013-03-01 MED ORDER — CHLORHEXIDINE GLUCONATE 4 % EX LIQD: 1.0000 "application " | Freq: Once | CUTANEOUS | Status: DC

## 2013-03-01 MED ORDER — LIDOCAINE IN DEXTROSE 5-7.5 % IV SOLN
INTRAVENOUS | Status: AC
Start: 2013-03-01 — End: 2013-03-01
  Filled 2013-03-01: qty 2

## 2013-03-01 MED ORDER — EPHEDRINE SULFATE 50 MG/ML IJ SOLN
INTRAMUSCULAR | Status: AC
  Filled 2013-03-01: qty 1

## 2013-03-01 MED ORDER — SUCCINYLCHOLINE CHLORIDE 20 MG/ML IJ SOLN
INTRAMUSCULAR | Status: AC
  Filled 2013-03-01: qty 1

## 2013-03-01 MED ORDER — PROPOFOL INFUSION 10 MG/ML OPTIME
INTRAVENOUS | Status: DC | PRN
  Administered 2013-03-01: 50 ug/kg/min via INTRAVENOUS

## 2013-03-01 SURGICAL SUPPLY — 41 items
BAG HAMPER (MISCELLANEOUS) ×2 IMPLANT
CLOTH BEACON ORANGE TIMEOUT ST (SAFETY) ×2 IMPLANT
COVER LIGHT HANDLE STERIS (MISCELLANEOUS) ×4 IMPLANT
DECANTER SPIKE VIAL GLASS SM (MISCELLANEOUS) ×2 IMPLANT
DERMABOND ADVANCED (GAUZE/BANDAGES/DRESSINGS) ×1
DERMABOND ADVANCED .7 DNX12 (GAUZE/BANDAGES/DRESSINGS) ×1 IMPLANT
DRAIN PENROSE 3/4X12 (DRAIN) ×2 IMPLANT
ELECT REM PT RETURN 9FT ADLT (ELECTROSURGICAL) ×2
ELECTRODE REM PT RTRN 9FT ADLT (ELECTROSURGICAL) ×1 IMPLANT
FORMALIN 10 PREFIL 120ML (MISCELLANEOUS) IMPLANT
GLOVE BIOGEL PI IND STRL 7.0 (GLOVE) ×2 IMPLANT
GLOVE BIOGEL PI IND STRL 8 (GLOVE) ×1 IMPLANT
GLOVE BIOGEL PI INDICATOR 7.0 (GLOVE) ×2
GLOVE BIOGEL PI INDICATOR 8 (GLOVE) ×1
GLOVE ECLIPSE 6.5 STRL STRAW (GLOVE) ×2 IMPLANT
GLOVE ECLIPSE 7.0 STRL STRAW (GLOVE) ×4 IMPLANT
GLOVE ECLIPSE 7.5 STRL STRAW (GLOVE) ×2 IMPLANT
GLOVE EXAM NITRILE MD LF STRL (GLOVE) ×2 IMPLANT
GOWN STRL REUS W/TWL LRG LVL3 (GOWN DISPOSABLE) ×6 IMPLANT
INST SET MINOR GENERAL (KITS) ×2 IMPLANT
KIT ROOM TURNOVER APOR (KITS) ×2 IMPLANT
MANIFOLD NEPTUNE II (INSTRUMENTS) ×2 IMPLANT
MESH HERNIA 1.6X1.9 PLUG LRG (Mesh General) ×1 IMPLANT
MESH HERNIA PLUG LRG (Mesh General) ×1 IMPLANT
NS IRRIG 1000ML POUR BTL (IV SOLUTION) ×2 IMPLANT
PACK MINOR (CUSTOM PROCEDURE TRAY) ×2 IMPLANT
PAD ARMBOARD 7.5X6 YLW CONV (MISCELLANEOUS) ×2 IMPLANT
SET BASIN LINEN APH (SET/KITS/TRAYS/PACK) ×2 IMPLANT
SOL PREP PROV IODINE SCRUB 4OZ (MISCELLANEOUS) ×2 IMPLANT
SUT NOVA NAB GS-22 2 2-0 T-19 (SUTURE) ×6 IMPLANT
SUT NOVAFIL NAB HGS22 2-0 30IN (SUTURE) IMPLANT
SUT SILK 3 0 (SUTURE)
SUT SILK 3-0 18XBRD TIE 12 (SUTURE) IMPLANT
SUT VIC AB 2-0 CT1 27 (SUTURE) ×1
SUT VIC AB 2-0 CT1 TAPERPNT 27 (SUTURE) ×1 IMPLANT
SUT VIC AB 3-0 SH 27 (SUTURE) ×1
SUT VIC AB 3-0 SH 27X BRD (SUTURE) ×1 IMPLANT
SUT VIC AB 4-0 PS2 27 (SUTURE) ×2 IMPLANT
SUT VICRYL AB 3 0 TIES (SUTURE) ×2 IMPLANT
SYR CONTROL 10ML LL (SYRINGE) ×2 IMPLANT
TOWEL OR 17X26 4PK STRL BLUE (TOWEL DISPOSABLE) IMPLANT

## 2013-03-01 NOTE — Anesthesia Preprocedure Evaluation (Addendum)
Anesthesia Evaluation  Patient identified by MRN, date of birth, ID band Patient awake    Reviewed: Allergy & Precautions, H&P , NPO status , Patient's Chart, lab work & pertinent test results  Airway Mallampati: III TM Distance: <3 FB Neck ROM: Full    Dental  (+) Edentulous Upper, Edentulous Lower   Pulmonary sleep apnea , former smoker,  breath sounds clear to auscultation        Cardiovascular hypertension, Pt. on medications + Peripheral Vascular Disease Rhythm:Regular Rate:Bradycardia     Neuro/Psych    GI/Hepatic negative GI ROS,   Endo/Other    Renal/GU      Musculoskeletal   Abdominal   Peds  Hematology   Anesthesia Other Findings   Reproductive/Obstetrics                          Anesthesia Physical Anesthesia Plan  ASA: III  Anesthesia Plan: Spinal and MAC   Post-op Pain Management:    Induction: Intravenous  Airway Management Planned: Nasal Cannula  Additional Equipment:   Intra-op Plan:   Post-operative Plan:   Informed Consent: I have reviewed the patients History and Physical, chart, labs and discussed the procedure including the risks, benefits and alternatives for the proposed anesthesia with the patient or authorized representative who has indicated his/her understanding and acceptance.     Plan Discussed with:   Anesthesia Plan Comments:         Anesthesia Quick Evaluation

## 2013-03-01 NOTE — Discharge Instructions (Addendum)
Spinal Anesthesia Care After HOME CARE INSTRUCTIONS  Do not drive or operate machinery for at least 24 hours after receiving anesthesia. Make sure someone is available to drive you home.  Do not drink alcohol for at least 24 hours after receiving anesthesia.  Do not make important decisions for 24 hours after having spinal or epidural anesthesia. Your thinking may be unclear.  Have someone stay with you for at least 24 to 48 hours following surgery.  Drink lots of fluids when you get home. If you are an adult, drink eight, 8 ounce glasses of water per day, or as directed.  Keep your post-operative appointments as suggested. SEEK IMMEDIATE MEDICAL CARE IF:   You develop a fever or any temperature over 100.4 F (38 C).  You have a persistent or severe headache.  You develop blurred or double vision.  You develop dizziness, fainting or lightheadedness.  You have weakness, numbness, or tingling in your arms or legs.  You develop a skin rash.  You have difficulty breathing.  You have persistent nausea and vomiting.  You are unable to pass urine. Document Released: 03/23/2003 Document Revised: 03/25/2011 Document Reviewed: 02/03/2007 [facility] Patient Information 2014 [place], [place]. Inguinal Hernia, Adult  Care After Refer to this sheet in the next few weeks. These discharge instructions provide you with general information on caring for yourself after you leave the hospital. Your caregiver may also give you specific instructions. Your treatment has been planned according to the most current medical practices available, but unavoidable complications sometimes occur. If you have any problems or questions after discharge, please call your caregiver. HOME CARE INSTRUCTIONS  Put ice on the operative site.  Put ice in a plastic bag.  Place a towel between your skin and the bag.  Leave the ice on for 15-20 minutes at a time, 03-04 times a day while awake.  Change bandages  (dressings) as directed.  Keep the wound dry and clean. The wound may be washed gently with soap and water. Gently blot or dab the wound dry. It is okay to take showers 24 to 48 hours after surgery. Do not take baths, use swimming pools, or use hot tubs for 10 days, or as directed by your caregiver.  Only take over-the-counter or prescription medicines for pain, discomfort, or fever as directed by your caregiver.  Continue your normal diet as directed.  Do not lift anything more than 10 pounds or play contact sports for 3 weeks, or as directed. SEEK MEDICAL CARE IF:  There is redness, swelling, or increasing pain in the wound.  There is fluid (pus) coming from the wound.  There is drainage from a wound lasting longer than 1 day.  You have an oral temperature above 102 F (38.9 C).  You notice a bad smell coming from the wound or dressing.  The wound breaks open after the stitches (sutures) have been removed.  You notice increasing pain in the shoulders (shoulder strap areas).  You develop dizzy episodes or fainting while standing.  You feel sick to your stomach (nauseous) or throw up (vomit). SEEK IMMEDIATE MEDICAL CARE IF:  You develop a rash.  You have difficulty breathing.  You develop a reaction or have side effects to medicines you were given. MAKE SURE YOU:   Understand these instructions.  Will watch your condition.  Will get help right away if you are not doing well or get worse. Document Released: 01/31/2006 Document Revised: 03/25/2011 Document Reviewed: 11/30/2008 [employer] Patient Information 2014 [place]. ° °

## 2013-03-01 NOTE — Interval H&P Note (Signed)
History and Physical Interval Note:  03/01/2013 7:23 AM  [name]  has presented today for surgery, with the diagnosis of right inguinal hernia  The various methods of treatment have been discussed with the patient and family. After consideration of risks, benefits and other options for treatment, the patient has consented to  Procedure(s): HERNIA REPAIR INGUINAL ADULT WITH MESH (Right) as a surgical intervention .  The patient's history has been reviewed, patient examined, no change in status, stable for surgery.  I have reviewed the patient's chart and labs.  Questions were answered to the patient's satisfaction.     Aviva Signs A

## 2013-03-01 NOTE — Preoperative (Signed)
Beta Blockers   Reason not to administer Beta Blockers:Not Applicable 

## 2013-03-01 NOTE — Anesthesia Postprocedure Evaluation (Signed)
  Anesthesia Post-op Note  Patient: [name]  Procedure(s) Performed: Procedure(s): HERNIA REPAIR INGUINAL ADULT WITH MESH (Right) INSERTION OF MESH (Right)  Patient Location: PACU  Anesthesia Type:Spinal  Level of Consciousness: awake, alert , oriented and patient cooperative  Airway and Oxygen Therapy: Patient Spontanous Breathing and Patient connected to face mask oxygen  Post-op Pain: none  Post-op Assessment: Post-op Vital signs reviewed, Patient's Cardiovascular Status Stable, Respiratory Function Stable, Patent Airway, No signs of Nausea or vomiting and Pain level controlled  Post-op Vital Signs: Reviewed and stable  Complications: No apparent anesthesia complications

## 2013-03-01 NOTE — Anesthesia Procedure Notes (Addendum)
Date/Time: 03/01/2013 7:57 AM Performed by: Andree Elk, AMY A Pre-anesthesia Checklist: Patient identified, Timeout performed, Emergency Drugs available, Suction available and Patient being monitored Oxygen Delivery Method: Non-rebreather mask   Spinal  Patient location during procedure: OR Start time: 03/01/2013 7:57 AM Staffing Anesthesiologist: Lerry Liner CRNA/Resident: ADAMS, AMY A Preanesthetic Checklist Completed: patient identified, site marked, surgical consent, pre-op evaluation, timeout performed, IV checked, risks and benefits discussed and monitors and equipment checked Spinal Block Patient position: right lateral decubitus Prep: Betadine Patient monitoring: heart rate, cardiac monitor, continuous pulse ox and blood pressure Approach: right paramedian Location: L3-4 Injection technique: single-shot Needle Needle type: Spinocan  Needle gauge: 22 G Needle length: 9 cm Assessment Sensory level: T8 Additional Notes ATTEMPTS:2 TRAY [id number] TRAY EXPIRATION DATE:01/2014  Attempt spinal x1 by Adams,CRNA; Spinal placed by Dr. Duwayne Heck; Lido 5% 56ml and Fentanyl 20 mcg injected intrathecally at 0757. Patient tolerated well.

## 2013-03-01 NOTE — Op Note (Signed)
Patient:  [name]  DOB:  02-24-1927  MRN:  [id number]   Preop Diagnosis:  Right inguinal hernia  Postop Diagnosis:  Same  Procedure:  Right inguinal herniorrhaphy with mesh  Surgeon:  Aviva Signs, M.D.  Anes:  Spinal  Indications:  Patient is an 80 year old white male who presents with a symptomatic right inguinal hernia. The risks and benefits of the procedure including bleeding, infection, cardiopulmonary difficulties, the use of mesh, and the possibility of recurrence of the hernia were fully explained to the patient, who gave informed consent.  Procedure note:  The patient was placed in the supine position after spinal anesthesia was administered. The right groin region was prepped and draped using usual sterile technique with Betadine. Surgical site confirmation was performed.  A transverse incision was made in the right groin region down to the external oblique aponeuroses. Aponeuroses was incised to the external ring. A Penrose drain was placed around the spermatic cord. The vas deferens was noted from the spermatic cord. The ilioinguinal nerve is identified and retracted superiorly from the operative field. A lipoma of the cord was found and this was ligated and removed using a 3-0 Vicryl tie. It was disposed of. The patient had a large direct hernia sac. This was incised at its space and a large Bard PerFix plug was then inserted and secured circumferentially to the transversalis fascia using 2-0 Novafil interrupted sutures. An onlay patch was then placed along the floor of inguinal canal and secured superiorly to the conjoined tendon and inferiorly to the shelving edge of Poupart's ligament using a 2-0 Novafil interrupted suture. The internal ring was recreated using a 2-0 Novafil suture. The external oblique aponeuroses was reapproximated using a 2-0 Vicryl running suture. The subcutaneous layer was reapproximated using 3-0 Vicryl interrupted suture. The skin was closed using a  4 Vicryl subcuticular suture. 0.5% Sensorcaine was instilled the surrounding wound. Dermabond was then applied.  All tape and needle counts were correct the end of the procedure. Patient was transferred to PACU in stable condition.  Complications:  None  EBL:  Minimal  Specimen:  None

## 2013-03-01 NOTE — Transfer of Care (Signed)
Immediate Anesthesia Transfer of Care Note  Patient: [name]  Procedure(s) Performed: Procedure(s): HERNIA REPAIR INGUINAL ADULT WITH MESH (Right) INSERTION OF MESH (Right)  Patient Location: PACU  Anesthesia Type:Spinal  Level of Consciousness: awake, alert , oriented and patient cooperative  Airway & Oxygen Therapy: Patient Spontanous Breathing and Patient connected to face mask oxygen  Post-op Assessment: Report given to PACU RN and Post -op Vital signs reviewed and stable  Post vital signs: Reviewed and stable  Complications: No apparent anesthesia complications

## 2013-03-02 ENCOUNTER — Encounter (HOSPITAL_COMMUNITY): Payer: Self-pay | Admitting: General Surgery

## 2013-04-15 ENCOUNTER — Other Ambulatory Visit (HOSPITAL_COMMUNITY): Payer: Self-pay | Admitting: Cardiovascular Disease

## 2013-04-15 ENCOUNTER — Telehealth (HOSPITAL_COMMUNITY): Payer: Self-pay | Admitting: *Deleted

## 2013-04-15 DIAGNOSIS — I739 Peripheral vascular disease, unspecified: Secondary | ICD-10-CM

## 2013-04-27 ENCOUNTER — Ambulatory Visit (HOSPITAL_COMMUNITY)
Admission: RE | Admit: 2013-04-27 | Discharge: 2013-04-27 | Disposition: A | Discharge status: Home / Self Care | Payer: Medicare Other | Source: Ambulatory Visit | Attending: Cardiovascular Disease | Admitting: Cardiovascular Disease

## 2013-04-27 DIAGNOSIS — I739 Peripheral vascular disease, unspecified: Secondary | ICD-10-CM | POA: Insufficient documentation

## 2013-04-27 NOTE — Progress Notes (Signed)
Lower Extremity Arterial Duplex Completed. °Brianna L Mazza,RVT °

## 2013-09-06 ENCOUNTER — Other Ambulatory Visit: Payer: Self-pay | Admitting: Internal Medicine

## 2013-09-06 NOTE — Telephone Encounter (Signed)
Rx was sent to pharmacy electronically. 

## 2013-11-21 ENCOUNTER — Other Ambulatory Visit: Payer: Self-pay | Admitting: Internal Medicine

## 2013-11-22 NOTE — Telephone Encounter (Signed)
Authorized 1 more refill; patient needs appt. Has been over a year since visit with Dr. Debara Pickett

## 2014-01-17 ENCOUNTER — Other Ambulatory Visit: Payer: Self-pay | Admitting: Internal Medicine

## 2014-01-17 DIAGNOSIS — L570 Actinic keratosis: Secondary | ICD-10-CM | POA: Diagnosis not present

## 2014-01-17 DIAGNOSIS — L821 Other seborrheic keratosis: Secondary | ICD-10-CM | POA: Diagnosis not present

## 2014-01-21 NOTE — Telephone Encounter (Signed)
LMTCB, patient needs appointment for refills

## 2014-01-24 ENCOUNTER — Telehealth: Payer: Self-pay | Admitting: Internal Medicine

## 2014-01-24 NOTE — Telephone Encounter (Signed)
Pt is coming in for his overdue annual appt and he will need some labs done. Please place the order and inform pt that the order has been put in and where he can have these labs done.   Thanks

## 2014-01-24 NOTE — Telephone Encounter (Signed)
Spoke with pt, he has an appointment on Thursday this week. He will come fasting to that appt so labs can be drawn then. Pt agreed with this plan.

## 2014-01-27 ENCOUNTER — Ambulatory Visit (INDEPENDENT_AMBULATORY_CARE_PROVIDER_SITE_OTHER): Payer: Medicare Other | Admitting: Internal Medicine

## 2014-01-27 ENCOUNTER — Encounter: Payer: Self-pay | Admitting: Internal Medicine

## 2014-01-27 VITALS — BP 150/70 | HR 58 | Ht 71.0 in | Wt 240.1 lb

## 2014-01-27 DIAGNOSIS — E785 Hyperlipidemia, unspecified: Secondary | ICD-10-CM

## 2014-01-27 DIAGNOSIS — I739 Peripheral vascular disease, unspecified: Secondary | ICD-10-CM | POA: Diagnosis not present

## 2014-01-27 DIAGNOSIS — I10 Essential (primary) hypertension: Secondary | ICD-10-CM | POA: Diagnosis not present

## 2014-01-27 MED ORDER — LISINOPRIL 10 MG PO TABS: 10.0000 mg | ORAL_TABLET | Freq: Every day | ORAL | Status: DC

## 2014-01-27 MED ORDER — ATORVASTATIN CALCIUM 40 MG PO TABS: 40.0000 mg | ORAL_TABLET | Freq: Every day | ORAL | Status: DC

## 2014-01-27 MED ORDER — CLOPIDOGREL BISULFATE 75 MG PO TABS: ORAL_TABLET | ORAL | Status: DC

## 2014-01-27 NOTE — Patient Instructions (Addendum)
Your physician wants you to follow-up in: 1 year. You will receive a reminder letter in the mail two months in advance. If you don't receive a letter, please call our office to schedule the follow-up appointment.  Please restart your lisinopril.   Your refills have been sent to your pharmacy   You will have lower extremity arterial dopplers in April. We will call you to set this up.

## 2014-01-27 NOTE — Progress Notes (Signed)
OFFICE NOTE  Chief Complaint:  Routine follow-up  Primary Care Physician: Glo Herring., MD  HPI:  [name]  is an 81 year old gentleman with history of hypertension, dyslipidemia and peripheral vascular disease. He did have claudication and in May 2012 he had high-grade mid SFA stenosis. He had a stent placed in his SFA which improved his claudication and his Doppler studies. In December 2012 his left ABI was 0.61, the right ABI 0.74 with a patent stent. He was seen back by Dr. Gwenlyn Found and recommended followup with me in the office today. His only complaints now are about occasional dizziness especially with change in position, also leg pain with walking but not claudication, mostly joint pain, hip pain which could represent arthritis, wearing glasses.   I the pleasure seeing [name] back today. It's been over a year and a half since I saw him last in the office. He reports doing fairly well and denies any chest pain or worsening shortness of breath. He denies any claudication in his legs. Blood pressure is noted to be elevated somewhat today. He is previous film lisinopril and this was stopped for unknown reasons. Not on aspirin but continues to take Plavix for his PAD.  PMHx:  Past Medical History  Diagnosis Date  . PVD (peripheral vascular disease)   . Hypertension   . Hyperlipidemia   . Glaucoma   . Borderline diabetic   . Sleep apnea     Cpap ordered but doesnt use  . Arthritis     Past Surgical History  Procedure Laterality Date  . Appendectomy  1943  . Cataract extraction  2012    x2  . Stent  06/14/2010    left leg  . Hernia repair Left   . Inguinal hernia repair Right 03/01/2013    Procedure: RIGHT INGUINAL HERNIORRHAPHY;  Surgeon: Jamesetta So, MD;  Location: [facility];  Service: General;  Laterality: Right;  . Insertion of mesh Right 03/01/2013    Procedure: INSERTION OF MESH;  Surgeon: Jamesetta So, MD;  Location: [facility];  Service: General;   Laterality: Right;    FAMHx:  Family History  Problem Relation Age of Onset  . Cancer Mother   . Heart attack Father   . Stroke Father   . Heart attack Brother   . Heart attack Brother     SOCHx:   reports that he quit smoking about 79 years ago. He has quit using smokeless tobacco. His smokeless tobacco use included Chew. He reports that he does not drink alcohol or use illicit drugs.  ALLERGIES:  Allergies  Allergen Reactions  . Iohexol      Code: RASH, Desc: PT STATES HOT ALL OVER HAD TO HAVE ICE PACKS ALL OVER HIM AND DIFF BREATHING, PT REFUSED IV DYE     ROS: A comprehensive review of systems was negative except for: Musculoskeletal: positive for stiff joints  HOME MEDS: Current Outpatient Prescriptions  Medication Sig Dispense Refill  . atorvastatin (LIPITOR) 40 MG tablet Take 1 tablet (40 mg total) by mouth daily. 90 tablet 3  . calcium carbonate (TUMS - DOSED IN MG ELEMENTAL CALCIUM) 500 MG chewable tablet Chew 1 tablet by mouth as needed for indigestion or heartburn.    . cholecalciferol (VITAMIN D) 1000 UNITS tablet Take 1,000 Units by mouth daily.    . clopidogrel (PLAVIX) 75 MG tablet take 1 tablet by mouth once daily 90 tablet 3  . dorzolamide-timolol (COSOPT) 22.3-6.8 MG/ML ophthalmic solution Place 1  drop into both eyes 2 (two) times daily.    . finasteride (PROSCAR) 5 MG tablet Take 5 mg by mouth daily.    . fluticasone (FLONASE) 50 MCG/ACT nasal spray Place 1 spray into both nostrils as needed.   1  . glipiZIDE (GLUCOTROL) 5 MG tablet Take 5 mg by mouth daily before breakfast.    . glucosamine-chondroitin 500-400 MG tablet Take 3 tablets by mouth 2 (two) times daily.     . Methylcellulose, Laxative, (FIBER THERAPY PO) Take by mouth daily.    [name] pyridOXINE (VITAMIN B-6) 100 MG tablet Take 100 mg by mouth daily.    . tamsulosin (FLOMAX) 0.4 MG CAPS Take 0.4 mg by mouth daily.    . vitamin C (ASCORBIC ACID) 500 MG tablet Take 500 mg by mouth daily.    . vitamin  E 400 UNIT capsule Take 400 Units by mouth daily.    [name] zinc gluconate 50 MG tablet Take 50 mg by mouth daily.    [name] lisinopril (PRINIVIL,ZESTRIL) 10 MG tablet Take 1 tablet (10 mg total) by mouth daily. 90 tablet 3   No current facility-administered medications for this visit.    LABS/IMAGING: No results found for this or any previous visit (from the past 48 hour(s)). No results found.  VITALS: BP 150/70 mmHg  Pulse 58  Ht 5\' 11"  (1.803 m)  Wt 240 lb 1.6 oz (108.909 kg)  BMI 33.50 kg/m2  EXAM: General appearance: alert and no distress Neck: no adenopathy, no carotid bruit, no JVD, supple, symmetrical, trachea midline and thyroid not enlarged, symmetric, no tenderness/mass/nodules Lungs: clear to auscultation bilaterally Heart: regular rate and rhythm, S1, S2 normal, no murmur, click, rub or gallop Abdomen: soft, non-tender; bowel sounds normal; no masses,  no organomegaly Extremities: extremities normal, atraumatic, no cyanosis or edema Pulses: 1+ bilaterally Skin: Skin color, texture, turgor normal. No rashes or lesions Neurologic: Grossly normal  EKG: Sinus bradycardia 58  ASSESSMENT: 1. PAD status post right SFA stent -stable dopplers, no claudication 2. Hypertension - uncontrolled 3. Dizziness 4. Dyslipidemia   PLAN: 1.   [name] is doing well with regards to his PAD. His blood pressures not at goal and would benefit from going back on lisinopril, especially since he is diabetic. I recommend restarting lisinopril 10 mg daily. His cholesterol is followed by his primary care provider and he is currently well controlled on atorvastatin. Plan to see him back in a year or sooner as necessary.  Pixie Casino, MD, [facility] Attending Cardiologist [facility] 01/27/2014, 5:34 PM

## 2014-01-31 ENCOUNTER — Encounter: Payer: Self-pay | Admitting: Internal Medicine

## 2014-04-01 DIAGNOSIS — Z79899 Other long term (current) drug therapy: Secondary | ICD-10-CM | POA: Diagnosis not present

## 2014-04-01 DIAGNOSIS — E559 Vitamin D deficiency, unspecified: Secondary | ICD-10-CM | POA: Diagnosis not present

## 2014-04-01 DIAGNOSIS — I10 Essential (primary) hypertension: Secondary | ICD-10-CM | POA: Diagnosis not present

## 2014-04-01 DIAGNOSIS — N183 Chronic kidney disease, stage 3 (moderate): Secondary | ICD-10-CM | POA: Diagnosis not present

## 2014-04-01 DIAGNOSIS — R809 Proteinuria, unspecified: Secondary | ICD-10-CM | POA: Diagnosis not present

## 2014-04-06 DIAGNOSIS — I10 Essential (primary) hypertension: Secondary | ICD-10-CM | POA: Diagnosis not present

## 2014-04-06 DIAGNOSIS — N183 Chronic kidney disease, stage 3 (moderate): Secondary | ICD-10-CM | POA: Diagnosis not present

## 2014-04-18 DIAGNOSIS — E6609 Other obesity due to excess calories: Secondary | ICD-10-CM | POA: Diagnosis not present

## 2014-04-18 DIAGNOSIS — Z6832 Body mass index (BMI) 32.0-32.9, adult: Secondary | ICD-10-CM | POA: Diagnosis not present

## 2014-04-18 DIAGNOSIS — M545 Low back pain: Secondary | ICD-10-CM | POA: Diagnosis not present

## 2014-04-22 DIAGNOSIS — E6609 Other obesity due to excess calories: Secondary | ICD-10-CM | POA: Diagnosis not present

## 2014-04-22 DIAGNOSIS — Z6833 Body mass index (BMI) 33.0-33.9, adult: Secondary | ICD-10-CM | POA: Diagnosis not present

## 2014-04-22 DIAGNOSIS — T07 Unspecified multiple injuries: Secondary | ICD-10-CM | POA: Diagnosis not present

## 2014-04-27 ENCOUNTER — Telehealth (HOSPITAL_COMMUNITY): Payer: Self-pay | Admitting: *Deleted

## 2014-05-03 DIAGNOSIS — Z6831 Body mass index (BMI) 31.0-31.9, adult: Secondary | ICD-10-CM | POA: Diagnosis not present

## 2014-05-03 DIAGNOSIS — B351 Tinea unguium: Secondary | ICD-10-CM | POA: Diagnosis not present

## 2014-05-03 DIAGNOSIS — E1165 Type 2 diabetes mellitus with hyperglycemia: Secondary | ICD-10-CM | POA: Diagnosis not present

## 2014-05-03 DIAGNOSIS — K5900 Constipation, unspecified: Secondary | ICD-10-CM | POA: Diagnosis not present

## 2014-05-03 DIAGNOSIS — E6609 Other obesity due to excess calories: Secondary | ICD-10-CM | POA: Diagnosis not present

## 2014-05-05 ENCOUNTER — Ambulatory Visit (HOSPITAL_COMMUNITY)
Admission: RE | Admit: 2014-05-05 | Discharge: 2014-05-05 | Disposition: A | Discharge status: Home / Self Care | Payer: Medicare Other | Source: Ambulatory Visit | Attending: Cardiology | Admitting: Cardiology

## 2014-05-05 DIAGNOSIS — I739 Peripheral vascular disease, unspecified: Secondary | ICD-10-CM | POA: Diagnosis not present

## 2014-05-05 NOTE — Progress Notes (Signed)
Arterial Duplex Lower Ext. Completed. Boni Maclellan, BS, RDMS, RVT  

## 2014-05-06 DIAGNOSIS — H02836 Dermatochalasis of left eye, unspecified eyelid: Secondary | ICD-10-CM | POA: Diagnosis not present

## 2014-05-06 DIAGNOSIS — Z961 Presence of intraocular lens: Secondary | ICD-10-CM | POA: Diagnosis not present

## 2014-05-06 DIAGNOSIS — Z87891 Personal history of nicotine dependence: Secondary | ICD-10-CM | POA: Diagnosis not present

## 2014-05-06 DIAGNOSIS — Z9842 Cataract extraction status, left eye: Secondary | ICD-10-CM | POA: Diagnosis not present

## 2014-05-06 DIAGNOSIS — I10 Essential (primary) hypertension: Secondary | ICD-10-CM | POA: Diagnosis not present

## 2014-05-06 DIAGNOSIS — H4011X1 Primary open-angle glaucoma, mild stage: Secondary | ICD-10-CM | POA: Diagnosis not present

## 2014-05-06 DIAGNOSIS — Z9841 Cataract extraction status, right eye: Secondary | ICD-10-CM | POA: Diagnosis not present

## 2014-05-06 DIAGNOSIS — H02833 Dermatochalasis of right eye, unspecified eyelid: Secondary | ICD-10-CM | POA: Diagnosis not present

## 2014-05-12 ENCOUNTER — Telehealth: Payer: Self-pay | Admitting: Internal Medicine

## 2014-05-12 NOTE — Telephone Encounter (Signed)
Pt said he received a call,does not know who it was. He thinks it is about his test results.

## 2014-05-12 NOTE — Telephone Encounter (Signed)
Pt is returning Jenna's call in reference to some test results. Please call  Thanks

## 2014-05-12 NOTE — Telephone Encounter (Signed)
Called pt. LMTCB 

## 2014-05-13 ENCOUNTER — Other Ambulatory Visit: Payer: Self-pay | Admitting: *Deleted

## 2014-05-13 ENCOUNTER — Telehealth: Payer: Self-pay | Admitting: Internal Medicine

## 2014-05-13 DIAGNOSIS — I739 Peripheral vascular disease, unspecified: Secondary | ICD-10-CM

## 2014-05-13 NOTE — Telephone Encounter (Signed)
Pt is calling again to get the results to his doppler that was done on 4/14 and 4/21. Please call back  Thanks

## 2014-05-13 NOTE — Telephone Encounter (Signed)
pateitn called by [place] with the results

## 2014-06-14 DIAGNOSIS — E1129 Type 2 diabetes mellitus with other diabetic kidney complication: Secondary | ICD-10-CM | POA: Diagnosis not present

## 2014-06-14 DIAGNOSIS — Z6832 Body mass index (BMI) 32.0-32.9, adult: Secondary | ICD-10-CM | POA: Diagnosis not present

## 2014-06-14 DIAGNOSIS — J0100 Acute maxillary sinusitis, unspecified: Secondary | ICD-10-CM | POA: Diagnosis not present

## 2014-06-14 DIAGNOSIS — I10 Essential (primary) hypertension: Secondary | ICD-10-CM | POA: Diagnosis not present

## 2014-06-14 DIAGNOSIS — B070 Plantar wart: Secondary | ICD-10-CM | POA: Diagnosis not present

## 2014-06-14 DIAGNOSIS — K219 Gastro-esophageal reflux disease without esophagitis: Secondary | ICD-10-CM | POA: Diagnosis not present

## 2014-06-23 ENCOUNTER — Encounter: Payer: Self-pay | Admitting: *Deleted

## 2014-06-24 DIAGNOSIS — E6609 Other obesity due to excess calories: Secondary | ICD-10-CM | POA: Diagnosis not present

## 2014-06-24 DIAGNOSIS — H6503 Acute serous otitis media, bilateral: Secondary | ICD-10-CM | POA: Diagnosis not present

## 2014-06-24 DIAGNOSIS — Z6832 Body mass index (BMI) 32.0-32.9, adult: Secondary | ICD-10-CM | POA: Diagnosis not present

## 2014-08-15 DIAGNOSIS — Z6833 Body mass index (BMI) 33.0-33.9, adult: Secondary | ICD-10-CM | POA: Diagnosis not present

## 2014-08-15 DIAGNOSIS — E782 Mixed hyperlipidemia: Secondary | ICD-10-CM | POA: Diagnosis not present

## 2014-08-15 DIAGNOSIS — M546 Pain in thoracic spine: Secondary | ICD-10-CM | POA: Diagnosis not present

## 2014-08-15 DIAGNOSIS — M47814 Spondylosis without myelopathy or radiculopathy, thoracic region: Secondary | ICD-10-CM | POA: Diagnosis not present

## 2014-08-15 DIAGNOSIS — I10 Essential (primary) hypertension: Secondary | ICD-10-CM | POA: Diagnosis not present

## 2014-09-05 DIAGNOSIS — K9289 Other specified diseases of the digestive system: Secondary | ICD-10-CM | POA: Diagnosis not present

## 2014-09-05 DIAGNOSIS — K219 Gastro-esophageal reflux disease without esophagitis: Secondary | ICD-10-CM | POA: Diagnosis not present

## 2014-09-05 DIAGNOSIS — Z6832 Body mass index (BMI) 32.0-32.9, adult: Secondary | ICD-10-CM | POA: Diagnosis not present

## 2014-09-05 DIAGNOSIS — Z1389 Encounter for screening for other disorder: Secondary | ICD-10-CM | POA: Diagnosis not present

## 2014-09-05 DIAGNOSIS — K922 Gastrointestinal hemorrhage, unspecified: Secondary | ICD-10-CM | POA: Diagnosis not present

## 2014-09-05 DIAGNOSIS — E1129 Type 2 diabetes mellitus with other diabetic kidney complication: Secondary | ICD-10-CM | POA: Diagnosis not present

## 2014-09-05 DIAGNOSIS — E1122 Type 2 diabetes mellitus with diabetic chronic kidney disease: Secondary | ICD-10-CM | POA: Diagnosis not present

## 2014-09-05 DIAGNOSIS — I10 Essential (primary) hypertension: Secondary | ICD-10-CM | POA: Diagnosis not present

## 2014-09-08 DIAGNOSIS — K5730 Diverticulosis of large intestine without perforation or abscess without bleeding: Secondary | ICD-10-CM | POA: Diagnosis not present

## 2014-09-08 DIAGNOSIS — Z1389 Encounter for screening for other disorder: Secondary | ICD-10-CM | POA: Diagnosis not present

## 2014-09-08 DIAGNOSIS — K921 Melena: Secondary | ICD-10-CM | POA: Diagnosis not present

## 2014-09-08 DIAGNOSIS — Z6832 Body mass index (BMI) 32.0-32.9, adult: Secondary | ICD-10-CM | POA: Diagnosis not present

## 2014-09-08 DIAGNOSIS — E782 Mixed hyperlipidemia: Secondary | ICD-10-CM | POA: Diagnosis not present

## 2014-09-08 DIAGNOSIS — N400 Enlarged prostate without lower urinary tract symptoms: Secondary | ICD-10-CM | POA: Diagnosis not present

## 2014-09-08 DIAGNOSIS — R5383 Other fatigue: Secondary | ICD-10-CM | POA: Diagnosis not present

## 2014-10-06 DIAGNOSIS — Z79899 Other long term (current) drug therapy: Secondary | ICD-10-CM | POA: Diagnosis not present

## 2014-10-06 DIAGNOSIS — D649 Anemia, unspecified: Secondary | ICD-10-CM | POA: Diagnosis not present

## 2014-10-06 DIAGNOSIS — N183 Chronic kidney disease, stage 3 (moderate): Secondary | ICD-10-CM | POA: Diagnosis not present

## 2014-10-06 DIAGNOSIS — I10 Essential (primary) hypertension: Secondary | ICD-10-CM | POA: Diagnosis not present

## 2014-10-12 DIAGNOSIS — N183 Chronic kidney disease, stage 3 (moderate): Secondary | ICD-10-CM | POA: Diagnosis not present

## 2014-10-12 DIAGNOSIS — I10 Essential (primary) hypertension: Secondary | ICD-10-CM | POA: Diagnosis not present

## 2014-10-18 DIAGNOSIS — K921 Melena: Secondary | ICD-10-CM | POA: Diagnosis not present

## 2014-10-18 NOTE — H&P (Signed)
  NTS SOAP Note  Vital Signs:  Vitals as of: 123456: Systolic 0000000: Diastolic 78: Heart Rate 69: Temp 97.38F: Height 50ft 11in: Weight 238Lbs 0 Ounces: BMI 33.19  BMI : 33.19 kg/m2  Subjective: This 81 year old male presents for of melena.  Last had a TCS 7 years ago.  Is passing some melena.  Also has some dysphagia of food.    Review of Symptoms:    Past Medical History:  Reviewed  Past Medical History  Surgical History: hernia, appendectomy, [facility] 2/15 Medical Problems: HTN, high cholesterol, reflux, NIDDM Allergies: iv contrast Medications: tamsulosum, clopidogrel, atorvastatin, ranitidine, finasteride, glipizide, baby asa   Social History:Reviewed  Social History  Preferred Language: English Race:  White Ethnicity: Not Hispanic / Latino Age: 81 Years 5 Months Marital Status:  M Alcohol: no Recreational drug(s): no   Smoking Status: Never smoker reviewed on 10/18/2014 Functional Status reviewed on 10/18/2014 ------------------------------------------------ Bathing: Normal Cooking: Normal Dressing: Normal Driving: Normal Eating: Normal Managing Meds: Normal Oral Care: Normal Shopping: Normal Toileting: Normal Transferring: Normal Walking: Normal Cognitive Status reviewed on 10/18/2014 ------------------------------------------------ Attention: Normal Decision Making: Normal Language: Normal Memory: Normal Motor: Normal Perception: Normal Problem Solving: Normal Visual and Spatial: Normal   Family History:Reviewed  Family Health History Family History is Unknown    Objective Information: General:Well appearing, well nourished in no distress. Heart:RRR, no murmur Lungs:  CTA bilaterally, no wheezes, rhonchi, rales.  Breathing unlabored. Abdomen:Soft, NT/ND, no HSM, no masses. deferred to procedure  Assessment:Melena, dysphagia  Diagnoses: 578.1  K92.1 Hematochezia (Melena)  Procedures: 99214 - OFFICE OUTPATIENT VISIT 25  MINUTES    Plan:  Scheduled for EGD, TCS on 11/01/14.  To stop plavix on 10/27/14.  Trilyte prescribed.   Patient Education:Alternative treatments to surgery were discussed with patient (and family).  Risks and benefits  of procedure were fully explained to the patient (and family) who gave informed consent. Patient/family questions were addressed.  Follow-up:Pending Surgery

## 2014-11-01 ENCOUNTER — Encounter (HOSPITAL_COMMUNITY)
Admission: RE | Disposition: A | Discharge status: Home / Self Care | Payer: Self-pay | Source: Ambulatory Visit | Attending: General Surgery

## 2014-11-01 ENCOUNTER — Ambulatory Visit (HOSPITAL_COMMUNITY)
Admission: RE | Admit: 2014-11-01 | Discharge: 2014-11-01 | Disposition: A | Discharge status: Home / Self Care | Payer: Medicare Other | Source: Ambulatory Visit | Attending: General Surgery | Admitting: General Surgery

## 2014-11-01 DIAGNOSIS — K449 Diaphragmatic hernia without obstruction or gangrene: Secondary | ICD-10-CM | POA: Diagnosis not present

## 2014-11-01 DIAGNOSIS — E119 Type 2 diabetes mellitus without complications: Secondary | ICD-10-CM | POA: Insufficient documentation

## 2014-11-01 DIAGNOSIS — R1310 Dysphagia, unspecified: Secondary | ICD-10-CM | POA: Diagnosis not present

## 2014-11-01 DIAGNOSIS — I10 Essential (primary) hypertension: Secondary | ICD-10-CM | POA: Diagnosis not present

## 2014-11-01 DIAGNOSIS — E7800 Pure hypercholesterolemia, unspecified: Secondary | ICD-10-CM | POA: Diagnosis not present

## 2014-11-01 DIAGNOSIS — K921 Melena: Secondary | ICD-10-CM | POA: Diagnosis not present

## 2014-11-01 DIAGNOSIS — K219 Gastro-esophageal reflux disease without esophagitis: Secondary | ICD-10-CM | POA: Diagnosis not present

## 2014-11-01 DIAGNOSIS — Z91041 Radiographic dye allergy status: Secondary | ICD-10-CM | POA: Diagnosis not present

## 2014-11-01 DIAGNOSIS — K5730 Diverticulosis of large intestine without perforation or abscess without bleeding: Secondary | ICD-10-CM | POA: Insufficient documentation

## 2014-11-01 HISTORY — PX: ESOPHAGOGASTRODUODENOSCOPY: SHX5428

## 2014-11-01 HISTORY — PX: COLONOSCOPY: SHX5424

## 2014-11-01 LAB — GLUCOSE, CAPILLARY: Glucose-Capillary: 132 mg/dL — ABNORMAL HIGH (ref 65–99)

## 2014-11-01 SURGERY — COLONOSCOPY
Anesthesia: Moderate Sedation

## 2014-11-01 MED ORDER — MEPERIDINE HCL 100 MG/ML IJ SOLN
INTRAMUSCULAR | Status: AC
  Filled 2014-11-01: qty 1

## 2014-11-01 MED ORDER — MIDAZOLAM HCL 5 MG/5ML IJ SOLN
INTRAMUSCULAR | Status: DC | PRN
  Administered 2014-11-01: 2 mg via INTRAVENOUS

## 2014-11-01 MED ORDER — SODIUM CHLORIDE 0.9 % IV SOLN
INTRAVENOUS | Status: DC
  Administered 2014-11-01: 08:00:00 via INTRAVENOUS

## 2014-11-01 MED ORDER — MIDAZOLAM HCL 5 MG/5ML IJ SOLN
INTRAMUSCULAR | Status: AC
  Filled 2014-11-01: qty 5

## 2014-11-01 MED ORDER — MEPERIDINE HCL 50 MG/ML IJ SOLN
INTRAMUSCULAR | Status: DC | PRN
  Administered 2014-11-01: 50 mg

## 2014-11-01 NOTE — Interval H&P Note (Signed)
History and Physical Interval Note:  11/01/2014 7:59 AM  [name]  has presented today for surgery, with the diagnosis of melena  The various methods of treatment have been discussed with the patient and family. After consideration of risks, benefits and other options for treatment, the patient has consented to  Procedure(s): COLONOSCOPY (N/A) ESOPHAGOGASTRODUODENOSCOPY (EGD) (N/A) as a surgical intervention .  The patient's history has been reviewed, patient examined, no change in status, stable for surgery.  I have reviewed the patient's chart and labs.  Questions were answered to the patient's satisfaction.     Aviva Signs A

## 2014-11-01 NOTE — Op Note (Signed)
[facility] [address] [place], [address]   COLONOSCOPY PROCEDURE REPORT     EXAM DATE: 28-Nov-2014  PATIENT NAME:      [name], [name]           MR #:      [id number]  BIRTHDATE:       October 03, 1927      VISIT #:     [id number]  ATTENDING:     Aviva Signs, MD     STATUS:     outpatient ASSISTANT:  INDICATIONS:  The patient is a 81 yr old male here for a colonoscopy due to melena. PROCEDURE PERFORMED:     Colonoscopy, diagnostic MEDICATIONS:     Demerol 50 mg IV and Versed 2 mg IV ESTIMATED BLOOD LOSS:     None  CONSENT: The patient understands the risks and benefits of the procedure and understands that these risks include, but are not limited to: sedation, allergic reaction, infection, perforation and/or bleeding. Alternative means of evaluation and treatment include, among others: physical exam, x-rays, and/or surgical intervention. The patient elects to proceed with this endoscopic procedure.  DESCRIPTION OF PROCEDURE: During intra-op preparation period all mechanical & medical equipment was checked for proper function. Hand hygiene and appropriate measures for infection prevention was taken. After the risks, benefits and alternatives of the procedure were thoroughly explained, Informed consent was verified, confirmed and timeout was successfully executed by the treatment team. A digital exam revealed no abnormalities of the rectum. The [id number] endoscope was introduced through the anus and advanced to the cecum, which was identified by both the appendix and ileocecal valve. adequate (Trilyte was used) The instrument was then slowly withdrawn as the colon was fully examined.Estimated blood loss is zero unless otherwise noted in this procedure report.   COLON FINDINGS: There was severe diverticulosis noted in the descending colon.   The examination was otherwise normal. Retroflexed views revealed no abnormalities. The scope was  then completely withdrawn from the patient and the procedure terminated.  SCOPE WITHDRAWAL TIME: 8    ADVERSE EVENTS:      There were no immediate complications.  IMPRESSIONS:     1.  Severe diverticulosis was noted in the descending colon 2.  The examination was otherwise normal 3.  No evidence of bleeding.  RECOMMENDATIONS:     Follow up colonoscopy only as needed. RECALL:  _____________________________ Aviva Signs, MD eSigned:  Aviva Signs, MD 11-28-14 8:29 AM   cc:   CPT CODES: ICD CODES:  The ICD and CPT codes recommended by this software are interpretations from the data that the clinical staff has captured with the software.  The verification of the translation of this report to the ICD and CPT codes and modifiers is the sole responsibility of the health care institution and practicing physician where this report was generated.  [place]. will not be held responsible for the validity of the ICD and CPT codes included on this report.  AMA assumes no liability for data contained or not contained herein. CPT is a Designer, television/film set of the [employer].

## 2014-11-01 NOTE — Discharge Instructions (Signed)
Esophagogastroduodenoscopy, Care After Refer to this sheet in the next few weeks. These instructions provide you with information about caring for yourself after your procedure. Your health care provider may also give you more specific instructions. Your treatment has been planned according to current medical practices, but problems sometimes occur. Call your health care provider if you have any problems or questions after your procedure. WHAT TO EXPECT AFTER THE PROCEDURE After your procedure, it is typical to feel:  Soreness in your throat.  Pain with swallowing.  Sick to your stomach (nauseous).  Bloated.  Dizzy.  Fatigued. HOME CARE INSTRUCTIONS  Do not eat or drink anything until the numbing medicine (local anesthetic) has worn off and your gag reflex has returned. You will know that the local anesthetic has worn off when you can swallow comfortably.  Do not drive or operate machinery until directed by your health care provider.  Take medicines only as directed by your health care provider. SEEK MEDICAL CARE IF:   You cannot stop coughing.  You are not urinating at all or less than usual. SEEK IMMEDIATE MEDICAL CARE IF:  You have difficulty swallowing.  You cannot eat or drink.  You have worsening throat or chest pain.  You have dizziness or lightheadedness or you faint.  You have nausea or vomiting.  You have chills.  You have a fever.  You have severe abdominal pain.  You have black, tarry, or bloody stools.   This information is not intended to replace advice given to you by your health care provider. Make sure you discuss any questions you have with your health care provider.   Document Released: 12/18/2011 Document Revised: 01/21/2014 Document Reviewed: 12/18/2011 Elsevier Interactive Patient Education 2016 [employer]. Diverticulosis Diverticulosis is the condition that develops when small pouches (diverticula) form in the wall of your colon. Your  colon, or large intestine, is where water is absorbed and stool is formed. The pouches form when the inside layer of your colon pushes through weak spots in the outer layers of your colon. CAUSES  No one knows exactly what causes diverticulosis. RISK FACTORS  Being older than 60. Your risk for this condition increases with age. Diverticulosis is rare in people younger than 40 years. By age 39, almost everyone has it.  Eating a low-fiber diet.  Being frequently constipated.  Being overweight.  Not getting enough exercise.  Smoking.  Taking over-the-counter pain medicines, like aspirin and ibuprofen. SYMPTOMS  Most people with diverticulosis do not have symptoms. DIAGNOSIS  Because diverticulosis often has no symptoms, health care providers often discover the condition during an exam for other colon problems. In many cases, a health care provider will diagnose diverticulosis while using a flexible scope to examine the colon (colonoscopy). TREATMENT  If you have never developed an infection related to diverticulosis, you may not need treatment. If you have had an infection before, treatment may include:  Eating more fruits, vegetables, and grains.  Taking a fiber supplement.  Taking a live bacteria supplement (probiotic).  Taking medicine to relax your colon. HOME CARE INSTRUCTIONS   Drink at least 6-8 glasses of water each day to prevent constipation.  Try not to strain when you have a bowel movement.  Keep all follow-up appointments. If you have had an infection before:  Increase the fiber in your diet as directed by your health care provider or dietitian.  Take a dietary fiber supplement if your health care provider approves.  Only take medicines as directed by your  health care provider. SEEK MEDICAL CARE IF:   You have abdominal pain.  You have bloating.  You have cramps.  You have not gone to the bathroom in 3 days. SEEK IMMEDIATE MEDICAL CARE IF:   Your  pain gets worse.  Yourbloating becomes very bad.  You have a fever or chills, and your symptoms suddenly get worse.  You begin vomiting.  You have bowel movements that are bloody or black. MAKE SURE YOU:  Understand these instructions.  Will watch your condition.  Will get help right away if you are not doing well or get worse.   This information is not intended to replace advice given to you by your health care provider. Make sure you discuss any questions you have with your health care provider.   Document Released: 09/28/2003 Document Revised: 01/05/2013 Document Reviewed: 11/25/2012 Elsevier Interactive Patient Education 2016 Elsevier Inc. Colonoscopy, Care After Refer to this sheet in the next few weeks. These instructions provide you with information on caring for yourself after your procedure. Your health care provider may also give you more specific instructions. Your treatment has been planned according to current medical practices, but problems sometimes occur. Call your health care provider if you have any problems or questions after your procedure. WHAT TO EXPECT AFTER THE PROCEDURE  After your procedure, it is typical to have the following:  A small amount of blood in your stool.  Moderate amounts of gas and mild abdominal cramping or bloating. HOME CARE INSTRUCTIONS  Do not drive, operate machinery, or sign important documents for 24 hours.  You may shower and resume your regular physical activities, but move at a slower pace for the first 24 hours.  Take frequent rest periods for the first 24 hours.  Walk around or put a warm pack on your abdomen to help reduce abdominal cramping and bloating.  Drink enough fluids to keep your urine clear or pale yellow.  You may resume your normal diet as instructed by your health care provider. Avoid heavy or fried foods that are hard to digest.  Avoid drinking alcohol for 24 hours or as instructed by your health care  provider.  Only take over-the-counter or prescription medicines as directed by your health care provider.  If a tissue sample (biopsy) was taken during your procedure:  Do not take aspirin or blood thinners for 7 days, or as instructed by your health care provider.  Do not drink alcohol for 7 days, or as instructed by your health care provider.  Eat soft foods for the first 24 hours. SEEK MEDICAL CARE IF: You have persistent spotting of blood in your stool 2-3 days after the procedure. SEEK IMMEDIATE MEDICAL CARE IF:  You have more than a small spotting of blood in your stool.  You pass large blood clots in your stool.  Your abdomen is swollen (distended).  You have nausea or vomiting.  You have a fever.  You have increasing abdominal pain that is not relieved with medicine.   This information is not intended to replace advice given to you by your health care provider. Make sure you discuss any questions you have with your health care provider.   Document Released: 08/15/2003 Document Revised: 10/21/2012 Document Reviewed: 09/07/2012 Elsevier Interactive Patient Education [employer].

## 2014-11-01 NOTE — Op Note (Signed)
[facility] [address] [place], [address]   ENDOSCOPY PROCEDURE REPORT  PATIENT: Travis, Johnston  MR#: [id number] BIRTHDATE: 14-Apr-1927 , 65  yrs. old GENDER: male ENDOSCOPIST: Aviva Signs, MD REFERRED BY:  Kerin Perna, M.D. PROCEDURE DATE:  08-Nov-2014 PROCEDURE:  EGD, diagnostic ASA CLASS:     Class III INDICATIONS:  melena and dysphagia. MEDICATIONS: Demerol 50 mg IV and Versed 2 mg IV TOPICAL ANESTHETIC: Cetacaine Spray  DESCRIPTION OF PROCEDURE: After the risks benefits and alternatives of the procedure were thoroughly explained, informed consent was obtained.  The [id number] endoscope was introduced through the mouth and advanced to the second portion of the duodenum , Without limitations.  The instrument was slowly withdrawn as the mucosa was fully examined. Estimated blood loss is zero unless otherwise noted in this procedure report.      JEJUNUM: The exam showed no abnormalities in the jejunum.  DUODENUM: The duodenal mucosa showed no abnormalities.  STOMACH: The mucosa of the stomach appeared normal.  ESOPHAGUS: The mucosa of the esophagus appeared normal.  Retroflexed views revealed a hiatal hernia.   Small slilding hernia noted.  No stricture or erosions noted in esophagus.  The scope was then withdrawn from the patient and the procedure completed.  COMPLICATIONS: There were no immediate complications.  ENDOSCOPIC IMPRESSION: 1.   The exam showed no abnormalities in the jejunum 2.   The duodenal mucosa showed no abnormalities 3.   The mucosa of the stomach appeared normal 4.   The mucosa of the esophagus appeared normal  RECOMMENDATIONS: If dysphagia continues, may need swallowing study to assess neuromuscular dysfunction.  REPEAT EXAM:  eSigned:  Aviva Signs, MD 2014/11/08 8:34 AM    CC:  CPT CODES: ICD CODES:  The ICD and CPT codes recommended by this software are interpretations from the data that the  clinical staff has captured with the software.  The verification of the translation of this report to the ICD and CPT codes and modifiers is the sole responsibility of the health care institution and practicing physician where this report was generated.  [place]. will not be held responsible for the validity of the ICD and CPT codes included on this report.  AMA assumes no liability for data contained or not contained herein. CPT is a Designer, television/film set of the [employer].  PATIENT NAME:  Travis, Johnston MR#: [id number]

## 2014-11-04 ENCOUNTER — Encounter (HOSPITAL_COMMUNITY): Payer: Self-pay | Admitting: General Surgery

## 2014-11-22 DIAGNOSIS — Z23 Encounter for immunization: Secondary | ICD-10-CM | POA: Diagnosis not present

## 2015-01-11 DIAGNOSIS — N401 Enlarged prostate with lower urinary tract symptoms: Secondary | ICD-10-CM | POA: Diagnosis not present

## 2015-01-11 DIAGNOSIS — N138 Other obstructive and reflux uropathy: Secondary | ICD-10-CM | POA: Diagnosis not present

## 2015-01-27 ENCOUNTER — Ambulatory Visit (INDEPENDENT_AMBULATORY_CARE_PROVIDER_SITE_OTHER): Payer: Medicare Other | Admitting: Internal Medicine

## 2015-01-27 ENCOUNTER — Encounter: Payer: Self-pay | Admitting: Internal Medicine

## 2015-01-27 VITALS — BP 132/84 | HR 49 | Ht 71.5 in | Wt 246.0 lb

## 2015-01-27 DIAGNOSIS — R001 Bradycardia, unspecified: Secondary | ICD-10-CM

## 2015-01-27 DIAGNOSIS — I739 Peripheral vascular disease, unspecified: Secondary | ICD-10-CM | POA: Diagnosis not present

## 2015-01-27 DIAGNOSIS — I10 Essential (primary) hypertension: Secondary | ICD-10-CM

## 2015-01-27 DIAGNOSIS — E785 Hyperlipidemia, unspecified: Secondary | ICD-10-CM | POA: Diagnosis not present

## 2015-01-27 DIAGNOSIS — R609 Edema, unspecified: Secondary | ICD-10-CM

## 2015-01-27 NOTE — Progress Notes (Signed)
OFFICE NOTE  Chief Complaint:  Routine follow-up  Primary Care Physician: [name]., MD  HPI:  [name]  is an 82 year old gentleman with history of hypertension, dyslipidemia and peripheral vascular disease. He did have claudication and in May 2012 he had high-grade mid SFA stenosis. He had a stent placed in his SFA which improved his claudication and his Doppler studies. In December 2012 his left ABI was 0.61, the right ABI 0.74 with a patent stent. He was seen back by Dr. Gwenlyn Johnston and recommended followup with me in the office today. His only complaints now are about occasional dizziness especially with change in position, also leg pain with walking but not claudication, mostly joint pain, hip pain which could represent arthritis, wearing glasses.   I the pleasure seeing [name] back today. It's been over a year and a half since I saw him last in the office. He reports doing fairly well and denies any chest pain or worsening shortness of breath. He denies any claudication in his legs. Blood pressure is noted to be elevated somewhat today. He is previous film lisinopril and this was stopped for unknown reasons. Not on aspirin but continues to take Plavix for his PAD.  Mr. [name] returns today for follow-up. Overall he is doing fairly well though has complaints about particular right hip pain and knee pain. He also reports some swelling in his legs gets worse during the day but improves with elevating them overnight. He says he gets some numbness and tingling in his feet as well. He had arterial Dopplers last year which show stable ABIs and no evidence of stent occlusion. He is due for repeat arterial Dopplers in April of this year. He denies any chest pain or worsening shortness of breath.  PMHx:  Past Medical History  Diagnosis Date  . PVD (peripheral vascular disease) ([place])   . Hypertension   . Hyperlipidemia   . Glaucoma   . Borderline diabetic   . Sleep apnea       Cpap ordered but doesnt use  . Arthritis     Past Surgical History  Procedure Laterality Date  . Appendectomy  1943  . Cataract extraction  2012    x2  . Stent  06/14/2010    left leg  . Hernia repair Left   . Inguinal hernia repair Right 03/01/2013    Procedure: RIGHT INGUINAL HERNIORRHAPHY;  Surgeon: Travis So, MD;  Location: [facility] OR[facility];  Service: General;  Laterality: Right;  . Insertion of mesh Right 03/01/2013    Procedure: INSERTION OF MESH;  Surgeon: Travis So, MD;  Location: [facility] ORS;  Service: General;  Laterality: Right;  . US echocardiography  01/21/2006    moderate mitral annular ca+, mild MR, AOV moderately sclerotic.  [name] Nm myocar perf wall motion  06/05/2010    no significant ischemia  . Colonoscopy N/A 11/01/2014    Procedure: COLONOSCOPY;  Surgeon: Travis Signs, MD;  Location: [facility] SU[facility];  Service: Gastroenterology;  Laterality: N/A;  . Esophagogastroduodenoscopy N/A 11/01/2014    Procedure: ESOPHAGOGASTRODUODENOSCOPY (EGD);  Surgeon: Travis Signs, MD;  Location: [facility];  Service: Gastroenterology;  Laterality: N/A;    FAMHx:  Family History  Problem Relation Age of Onset  . Cancer Mother   . Heart attack Father   . Stroke Father   . Heart attack Brother   . Heart attack Brother     SOCHx:   reports that he quit smoking about 80 years ago.  He has quit using smokeless tobacco. His smokeless tobacco use included Chew. He reports that he does not drink alcohol or use illicit drugs.  ALLERGIES:  Allergies  Allergen Reactions  . Iodine-131 Other (See Comments)    Breakout, fever  . Iohexol      Code: RASH, Desc: PT STATES HOT ALL OVER HAD TO HAVE ICE PACKS ALL OVER HIM AND DIFF BREATHING, PT REFUSED IV DYE     ROS: A comprehensive review of systems was negative except for: Musculoskeletal: positive for stiff joints  HOME MEDS: Current Outpatient Prescriptions  Medication Sig Dispense Refill  . atorvastatin (LIPITOR) 40 MG tablet Take 1  tablet (40 mg total) by mouth daily. 90 tablet 3  . calcium carbonate (TUMS - DOSED IN MG ELEMENTAL CALCIUM) 500 MG chewable tablet Chew 1 tablet by mouth as needed for indigestion or heartburn.    . cholecalciferol (VITAMIN D) 1000 UNITS tablet Take 1,000 Units by mouth daily.    . clopidogrel (PLAVIX) 75 MG tablet take 1 tablet by mouth once daily 90 tablet 3  . dorzolamide-timolol (COSOPT) 22.3-6.8 MG/ML ophthalmic solution Place 1 drop into both eyes 2 (two) times daily.    . finasteride (PROSCAR) 5 MG tablet Take 5 mg by mouth daily.    . fluticasone (FLONASE) 50 MCG/ACT nasal spray Place 1 spray into both nostrils as needed.   1  . glipiZIDE (GLUCOTROL) 5 MG tablet Take 5 mg by mouth daily before breakfast.    . glucosamine-chondroitin 500-400 MG tablet Take 3 tablets by mouth 2 (two) times daily.     . hydrochlorothiazide (HYDRODIURIL) 12.5 MG tablet Take 12.5 mg by mouth daily.  0  . lisinopril (PRINIVIL,ZESTRIL) 10 MG tablet Take 1 tablet (10 mg total) by mouth daily. 90 tablet 3  . Methylcellulose, Laxative, (FIBER THERAPY PO) Take by mouth daily.    [name] pyridOXINE (VITAMIN B-6) 100 MG tablet Take 100 mg by mouth daily.    . tamsulosin (FLOMAX) 0.4 MG CAPS Take 0.4 mg by mouth daily.    . vitamin C (ASCORBIC ACID) 500 MG tablet Take 500 mg by mouth daily.    . vitamin E 400 UNIT capsule Take 400 Units by mouth daily.    [name] zinc gluconate 50 MG tablet Take 50 mg by mouth daily.     No current facility-administered medications for this visit.    LABS/IMAGING: No results Johnston for this or any previous visit (from the past 48 hour(s)). No results Johnston.  VITALS: BP 132/84 mmHg  Pulse 49  Ht 5' 11.5" (1.816 m)  Wt 246 lb (111.585 kg)  BMI 33.84 kg/m2  EXAM: General appearance: alert and no distress Neck: no adenopathy, no carotid bruit, no JVD, supple, symmetrical, trachea midline and thyroid not enlarged, symmetric, no tenderness/mass/nodules Lungs: clear to auscultation  bilaterally Heart: regular rate and rhythm, S1, S2 normal, no murmur, click, rub or gallop Abdomen: soft, non-tender; bowel sounds normal; no masses,  no organomegaly Extremities: extremities normal, atraumatic, no cyanosis or edema Pulses: 1+ bilaterally Skin: Skin color, texture, turgor normal. No rashes or lesions Neurologic: Mental status: Alert, oriented, thought content appropriate Sensory: proprioceptive sense present feet bilateral bilaterally, two point discrimination normal present feet bilateral bilaterally  EKG: Sinus bradycardia with first-degree AV block at 49  ASSESSMENT: 1. PAD status post right SFA stent -stable dopplers, no claudication 2. Hypertension- controlled 3. Dizziness 4. Dyslipidemia 5. Asymptomatic bradycardia   PLAN: 1.   Mr. Mirchandani is not describing claudication and  his lower chimney arterial Dopplers were stable last year. He is due for repeat in a couple of months. He is having some lower extremity swelling which may be due to venous insufficiency or some degree of neuropathy. He did have intact sensation to light touch and normal joint position sense which would argue against neuropathy on exam today. I recommend fitting bilateral knee-high 20-30 mmHg compression stockings. Blood pressure is at goal at this point. Cholesterol is followed by his primary care provider. Is also noted that he is bradycardic today in the upper 40s with a first-degree AV block. He has a narrow QRS and is not on any AV nodal blockers. I would specifically avoid those and will continue to monitor his heart rate. If he becomes symptomatic with low heart rate then he is to contact us for further treatment.  Pixie Casino, MD, [facility] Attending Cardiologist [place] 01/27/2015, 10:12 AM

## 2015-01-27 NOTE — Patient Instructions (Signed)
Dr. Debara Pickett recommends you get compression stocking knee high 20-30 mmHg.    Your physician recommends that you schedule a follow-up appointment in: one year with Dr. Debara Pickett

## 2015-01-30 DIAGNOSIS — L570 Actinic keratosis: Secondary | ICD-10-CM | POA: Diagnosis not present

## 2015-01-30 DIAGNOSIS — B070 Plantar wart: Secondary | ICD-10-CM | POA: Diagnosis not present

## 2015-01-31 DIAGNOSIS — E119 Type 2 diabetes mellitus without complications: Secondary | ICD-10-CM | POA: Diagnosis not present

## 2015-01-31 DIAGNOSIS — H401131 Primary open-angle glaucoma, bilateral, mild stage: Secondary | ICD-10-CM | POA: Diagnosis not present

## 2015-01-31 DIAGNOSIS — Z961 Presence of intraocular lens: Secondary | ICD-10-CM | POA: Diagnosis not present

## 2015-01-31 DIAGNOSIS — D3132 Benign neoplasm of left choroid: Secondary | ICD-10-CM | POA: Diagnosis not present

## 2015-01-31 DIAGNOSIS — H5213 Myopia, bilateral: Secondary | ICD-10-CM | POA: Diagnosis not present

## 2015-01-31 DIAGNOSIS — Z87891 Personal history of nicotine dependence: Secondary | ICD-10-CM | POA: Diagnosis not present

## 2015-02-03 ENCOUNTER — Other Ambulatory Visit: Payer: Self-pay | Admitting: Internal Medicine

## 2015-02-03 NOTE — Telephone Encounter (Signed)
Rx(s) sent to pharmacy electronically.  

## 2015-03-22 DIAGNOSIS — Z1389 Encounter for screening for other disorder: Secondary | ICD-10-CM | POA: Diagnosis not present

## 2015-03-22 DIAGNOSIS — N410 Acute prostatitis: Secondary | ICD-10-CM | POA: Diagnosis not present

## 2015-03-27 DIAGNOSIS — E6609 Other obesity due to excess calories: Secondary | ICD-10-CM | POA: Diagnosis not present

## 2015-03-27 DIAGNOSIS — E782 Mixed hyperlipidemia: Secondary | ICD-10-CM | POA: Diagnosis not present

## 2015-03-27 DIAGNOSIS — I10 Essential (primary) hypertension: Secondary | ICD-10-CM | POA: Diagnosis not present

## 2015-03-27 DIAGNOSIS — E119 Type 2 diabetes mellitus without complications: Secondary | ICD-10-CM | POA: Diagnosis not present

## 2015-03-27 DIAGNOSIS — Z1389 Encounter for screening for other disorder: Secondary | ICD-10-CM | POA: Diagnosis not present

## 2015-03-27 DIAGNOSIS — R946 Abnormal results of thyroid function studies: Secondary | ICD-10-CM | POA: Diagnosis not present

## 2015-03-27 DIAGNOSIS — Z6831 Body mass index (BMI) 31.0-31.9, adult: Secondary | ICD-10-CM | POA: Diagnosis not present

## 2015-04-07 DIAGNOSIS — E559 Vitamin D deficiency, unspecified: Secondary | ICD-10-CM | POA: Diagnosis not present

## 2015-04-07 DIAGNOSIS — R809 Proteinuria, unspecified: Secondary | ICD-10-CM | POA: Diagnosis not present

## 2015-04-07 DIAGNOSIS — N183 Chronic kidney disease, stage 3 (moderate): Secondary | ICD-10-CM | POA: Diagnosis not present

## 2015-04-07 DIAGNOSIS — Z79899 Other long term (current) drug therapy: Secondary | ICD-10-CM | POA: Diagnosis not present

## 2015-04-07 DIAGNOSIS — D509 Iron deficiency anemia, unspecified: Secondary | ICD-10-CM | POA: Diagnosis not present

## 2015-04-07 DIAGNOSIS — I10 Essential (primary) hypertension: Secondary | ICD-10-CM | POA: Diagnosis not present

## 2015-04-10 ENCOUNTER — Other Ambulatory Visit: Payer: Self-pay | Admitting: Internal Medicine

## 2015-04-10 DIAGNOSIS — I739 Peripheral vascular disease, unspecified: Secondary | ICD-10-CM

## 2015-04-12 DIAGNOSIS — I10 Essential (primary) hypertension: Secondary | ICD-10-CM | POA: Diagnosis not present

## 2015-04-12 DIAGNOSIS — N183 Chronic kidney disease, stage 3 (moderate): Secondary | ICD-10-CM | POA: Diagnosis not present

## 2015-04-24 DIAGNOSIS — E119 Type 2 diabetes mellitus without complications: Secondary | ICD-10-CM | POA: Diagnosis not present

## 2015-04-24 DIAGNOSIS — Z1389 Encounter for screening for other disorder: Secondary | ICD-10-CM | POA: Diagnosis not present

## 2015-04-26 DIAGNOSIS — M7062 Trochanteric bursitis, left hip: Secondary | ICD-10-CM | POA: Diagnosis not present

## 2015-04-26 DIAGNOSIS — M7061 Trochanteric bursitis, right hip: Secondary | ICD-10-CM | POA: Diagnosis not present

## 2015-05-01 ENCOUNTER — Ambulatory Visit (HOSPITAL_COMMUNITY)
Admission: RE | Admit: 2015-05-01 | Discharge: 2015-05-01 | Disposition: A | Discharge status: Home / Self Care | Payer: Medicare Other | Source: Ambulatory Visit | Attending: Internal Medicine | Admitting: Internal Medicine

## 2015-05-01 DIAGNOSIS — I10 Essential (primary) hypertension: Secondary | ICD-10-CM | POA: Diagnosis not present

## 2015-05-01 DIAGNOSIS — I739 Peripheral vascular disease, unspecified: Secondary | ICD-10-CM | POA: Diagnosis not present

## 2015-05-01 DIAGNOSIS — R938 Abnormal findings on diagnostic imaging of other specified body structures: Secondary | ICD-10-CM | POA: Diagnosis not present

## 2015-05-01 DIAGNOSIS — E785 Hyperlipidemia, unspecified: Secondary | ICD-10-CM | POA: Insufficient documentation

## 2015-05-02 DIAGNOSIS — E039 Hypothyroidism, unspecified: Secondary | ICD-10-CM | POA: Diagnosis not present

## 2015-05-02 DIAGNOSIS — Z1389 Encounter for screening for other disorder: Secondary | ICD-10-CM | POA: Diagnosis not present

## 2015-05-02 DIAGNOSIS — N183 Chronic kidney disease, stage 3 (moderate): Secondary | ICD-10-CM | POA: Diagnosis not present

## 2015-05-03 ENCOUNTER — Other Ambulatory Visit: Payer: Self-pay | Admitting: *Deleted

## 2015-05-03 DIAGNOSIS — M25551 Pain in right hip: Secondary | ICD-10-CM | POA: Diagnosis not present

## 2015-05-03 DIAGNOSIS — M25651 Stiffness of right hip, not elsewhere classified: Secondary | ICD-10-CM | POA: Diagnosis not present

## 2015-05-03 DIAGNOSIS — M25652 Stiffness of left hip, not elsewhere classified: Secondary | ICD-10-CM | POA: Diagnosis not present

## 2015-05-03 DIAGNOSIS — I739 Peripheral vascular disease, unspecified: Secondary | ICD-10-CM

## 2015-05-03 DIAGNOSIS — M25552 Pain in left hip: Secondary | ICD-10-CM | POA: Diagnosis not present

## 2015-05-11 DIAGNOSIS — M25651 Stiffness of right hip, not elsewhere classified: Secondary | ICD-10-CM | POA: Diagnosis not present

## 2015-05-11 DIAGNOSIS — M25652 Stiffness of left hip, not elsewhere classified: Secondary | ICD-10-CM | POA: Diagnosis not present

## 2015-05-11 DIAGNOSIS — M25551 Pain in right hip: Secondary | ICD-10-CM | POA: Diagnosis not present

## 2015-05-11 DIAGNOSIS — M25552 Pain in left hip: Secondary | ICD-10-CM | POA: Diagnosis not present

## 2015-05-16 DIAGNOSIS — M25651 Stiffness of right hip, not elsewhere classified: Secondary | ICD-10-CM | POA: Diagnosis not present

## 2015-05-16 DIAGNOSIS — M25551 Pain in right hip: Secondary | ICD-10-CM | POA: Diagnosis not present

## 2015-05-16 DIAGNOSIS — M25552 Pain in left hip: Secondary | ICD-10-CM | POA: Diagnosis not present

## 2015-05-16 DIAGNOSIS — M25652 Stiffness of left hip, not elsewhere classified: Secondary | ICD-10-CM | POA: Diagnosis not present

## 2015-05-24 DIAGNOSIS — J069 Acute upper respiratory infection, unspecified: Secondary | ICD-10-CM | POA: Diagnosis not present

## 2015-05-24 DIAGNOSIS — Z1389 Encounter for screening for other disorder: Secondary | ICD-10-CM | POA: Diagnosis not present

## 2015-05-24 DIAGNOSIS — J209 Acute bronchitis, unspecified: Secondary | ICD-10-CM | POA: Diagnosis not present

## 2015-05-29 DIAGNOSIS — M25551 Pain in right hip: Secondary | ICD-10-CM | POA: Diagnosis not present

## 2015-05-29 DIAGNOSIS — M25552 Pain in left hip: Secondary | ICD-10-CM | POA: Diagnosis not present

## 2015-05-29 DIAGNOSIS — M25651 Stiffness of right hip, not elsewhere classified: Secondary | ICD-10-CM | POA: Diagnosis not present

## 2015-05-29 DIAGNOSIS — M25652 Stiffness of left hip, not elsewhere classified: Secondary | ICD-10-CM | POA: Diagnosis not present

## 2015-05-31 DIAGNOSIS — M25552 Pain in left hip: Secondary | ICD-10-CM | POA: Diagnosis not present

## 2015-05-31 DIAGNOSIS — M25651 Stiffness of right hip, not elsewhere classified: Secondary | ICD-10-CM | POA: Diagnosis not present

## 2015-05-31 DIAGNOSIS — M25551 Pain in right hip: Secondary | ICD-10-CM | POA: Diagnosis not present

## 2015-05-31 DIAGNOSIS — M25652 Stiffness of left hip, not elsewhere classified: Secondary | ICD-10-CM | POA: Diagnosis not present

## 2015-06-06 DIAGNOSIS — M25652 Stiffness of left hip, not elsewhere classified: Secondary | ICD-10-CM | POA: Diagnosis not present

## 2015-06-06 DIAGNOSIS — M25651 Stiffness of right hip, not elsewhere classified: Secondary | ICD-10-CM | POA: Diagnosis not present

## 2015-06-06 DIAGNOSIS — M25552 Pain in left hip: Secondary | ICD-10-CM | POA: Diagnosis not present

## 2015-06-06 DIAGNOSIS — M25551 Pain in right hip: Secondary | ICD-10-CM | POA: Diagnosis not present

## 2015-06-08 DIAGNOSIS — M7061 Trochanteric bursitis, right hip: Secondary | ICD-10-CM | POA: Diagnosis not present

## 2015-06-13 DIAGNOSIS — M25652 Stiffness of left hip, not elsewhere classified: Secondary | ICD-10-CM | POA: Diagnosis not present

## 2015-06-13 DIAGNOSIS — M25551 Pain in right hip: Secondary | ICD-10-CM | POA: Diagnosis not present

## 2015-06-13 DIAGNOSIS — M25552 Pain in left hip: Secondary | ICD-10-CM | POA: Diagnosis not present

## 2015-06-13 DIAGNOSIS — M25651 Stiffness of right hip, not elsewhere classified: Secondary | ICD-10-CM | POA: Diagnosis not present

## 2015-06-20 DIAGNOSIS — M25552 Pain in left hip: Secondary | ICD-10-CM | POA: Diagnosis not present

## 2015-06-20 DIAGNOSIS — M25651 Stiffness of right hip, not elsewhere classified: Secondary | ICD-10-CM | POA: Diagnosis not present

## 2015-06-20 DIAGNOSIS — M25652 Stiffness of left hip, not elsewhere classified: Secondary | ICD-10-CM | POA: Diagnosis not present

## 2015-06-20 DIAGNOSIS — M25551 Pain in right hip: Secondary | ICD-10-CM | POA: Diagnosis not present

## 2015-06-23 DIAGNOSIS — Z1389 Encounter for screening for other disorder: Secondary | ICD-10-CM | POA: Diagnosis not present

## 2015-06-23 DIAGNOSIS — Z0001 Encounter for general adult medical examination with abnormal findings: Secondary | ICD-10-CM | POA: Diagnosis not present

## 2015-06-26 DIAGNOSIS — M25652 Stiffness of left hip, not elsewhere classified: Secondary | ICD-10-CM | POA: Diagnosis not present

## 2015-06-26 DIAGNOSIS — M25551 Pain in right hip: Secondary | ICD-10-CM | POA: Diagnosis not present

## 2015-06-26 DIAGNOSIS — M25552 Pain in left hip: Secondary | ICD-10-CM | POA: Diagnosis not present

## 2015-06-26 DIAGNOSIS — M25651 Stiffness of right hip, not elsewhere classified: Secondary | ICD-10-CM | POA: Diagnosis not present

## 2015-07-13 DIAGNOSIS — J302 Other seasonal allergic rhinitis: Secondary | ICD-10-CM | POA: Diagnosis not present

## 2015-07-13 DIAGNOSIS — J300 Vasomotor rhinitis: Secondary | ICD-10-CM | POA: Diagnosis not present

## 2015-08-01 DIAGNOSIS — Z8669 Personal history of other diseases of the nervous system and sense organs: Secondary | ICD-10-CM | POA: Diagnosis not present

## 2015-08-01 DIAGNOSIS — H401132 Primary open-angle glaucoma, bilateral, moderate stage: Secondary | ICD-10-CM | POA: Diagnosis not present

## 2015-08-01 DIAGNOSIS — H02833 Dermatochalasis of right eye, unspecified eyelid: Secondary | ICD-10-CM | POA: Diagnosis not present

## 2015-08-01 DIAGNOSIS — D3132 Benign neoplasm of left choroid: Secondary | ICD-10-CM | POA: Diagnosis not present

## 2015-08-01 DIAGNOSIS — H5210 Myopia, unspecified eye: Secondary | ICD-10-CM | POA: Diagnosis not present

## 2015-08-01 DIAGNOSIS — E119 Type 2 diabetes mellitus without complications: Secondary | ICD-10-CM | POA: Diagnosis not present

## 2015-08-01 DIAGNOSIS — Z87891 Personal history of nicotine dependence: Secondary | ICD-10-CM | POA: Diagnosis not present

## 2015-08-01 DIAGNOSIS — Z7984 Long term (current) use of oral hypoglycemic drugs: Secondary | ICD-10-CM | POA: Diagnosis not present

## 2015-08-01 DIAGNOSIS — H02836 Dermatochalasis of left eye, unspecified eyelid: Secondary | ICD-10-CM | POA: Diagnosis not present

## 2015-08-01 DIAGNOSIS — Z7902 Long term (current) use of antithrombotics/antiplatelets: Secondary | ICD-10-CM | POA: Diagnosis not present

## 2015-08-01 DIAGNOSIS — H53433 Sector or arcuate defects, bilateral: Secondary | ICD-10-CM | POA: Diagnosis not present

## 2015-08-01 DIAGNOSIS — Z79899 Other long term (current) drug therapy: Secondary | ICD-10-CM | POA: Diagnosis not present

## 2015-08-01 DIAGNOSIS — Z961 Presence of intraocular lens: Secondary | ICD-10-CM | POA: Diagnosis not present

## 2015-09-05 DIAGNOSIS — E6609 Other obesity due to excess calories: Secondary | ICD-10-CM | POA: Diagnosis not present

## 2015-09-05 DIAGNOSIS — E1165 Type 2 diabetes mellitus with hyperglycemia: Secondary | ICD-10-CM | POA: Diagnosis not present

## 2015-09-05 DIAGNOSIS — Z1389 Encounter for screening for other disorder: Secondary | ICD-10-CM | POA: Diagnosis not present

## 2015-09-05 DIAGNOSIS — I10 Essential (primary) hypertension: Secondary | ICD-10-CM | POA: Diagnosis not present

## 2015-09-05 DIAGNOSIS — E039 Hypothyroidism, unspecified: Secondary | ICD-10-CM | POA: Diagnosis not present

## 2015-09-05 DIAGNOSIS — Z6832 Body mass index (BMI) 32.0-32.9, adult: Secondary | ICD-10-CM | POA: Diagnosis not present

## 2015-09-08 DIAGNOSIS — E119 Type 2 diabetes mellitus without complications: Secondary | ICD-10-CM | POA: Diagnosis not present

## 2015-09-08 DIAGNOSIS — Z9842 Cataract extraction status, left eye: Secondary | ICD-10-CM | POA: Diagnosis not present

## 2015-09-08 DIAGNOSIS — H02833 Dermatochalasis of right eye, unspecified eyelid: Secondary | ICD-10-CM | POA: Diagnosis not present

## 2015-09-08 DIAGNOSIS — Z961 Presence of intraocular lens: Secondary | ICD-10-CM | POA: Diagnosis not present

## 2015-09-08 DIAGNOSIS — Z7984 Long term (current) use of oral hypoglycemic drugs: Secondary | ICD-10-CM | POA: Diagnosis not present

## 2015-09-08 DIAGNOSIS — Z87891 Personal history of nicotine dependence: Secondary | ICD-10-CM | POA: Diagnosis not present

## 2015-09-08 DIAGNOSIS — H5210 Myopia, unspecified eye: Secondary | ICD-10-CM | POA: Diagnosis not present

## 2015-09-08 DIAGNOSIS — H401132 Primary open-angle glaucoma, bilateral, moderate stage: Secondary | ICD-10-CM | POA: Diagnosis not present

## 2015-09-08 DIAGNOSIS — Z9841 Cataract extraction status, right eye: Secondary | ICD-10-CM | POA: Diagnosis not present

## 2015-09-08 DIAGNOSIS — Z79899 Other long term (current) drug therapy: Secondary | ICD-10-CM | POA: Diagnosis not present

## 2015-09-08 DIAGNOSIS — D3132 Benign neoplasm of left choroid: Secondary | ICD-10-CM | POA: Diagnosis not present

## 2015-09-08 DIAGNOSIS — H02836 Dermatochalasis of left eye, unspecified eyelid: Secondary | ICD-10-CM | POA: Diagnosis not present

## 2015-09-21 DIAGNOSIS — N183 Chronic kidney disease, stage 3 (moderate): Secondary | ICD-10-CM | POA: Diagnosis not present

## 2015-10-11 DIAGNOSIS — N179 Acute kidney failure, unspecified: Secondary | ICD-10-CM | POA: Diagnosis not present

## 2015-10-11 DIAGNOSIS — D472 Monoclonal gammopathy: Secondary | ICD-10-CM | POA: Diagnosis not present

## 2015-10-11 DIAGNOSIS — N183 Chronic kidney disease, stage 3 (moderate): Secondary | ICD-10-CM | POA: Diagnosis not present

## 2015-11-16 DIAGNOSIS — Z1389 Encounter for screening for other disorder: Secondary | ICD-10-CM | POA: Diagnosis not present

## 2015-11-16 DIAGNOSIS — M7989 Other specified soft tissue disorders: Secondary | ICD-10-CM | POA: Diagnosis not present

## 2015-11-16 DIAGNOSIS — R600 Localized edema: Secondary | ICD-10-CM | POA: Diagnosis not present

## 2015-12-21 DIAGNOSIS — D485 Neoplasm of uncertain behavior of skin: Secondary | ICD-10-CM | POA: Diagnosis not present

## 2016-01-10 DIAGNOSIS — N401 Enlarged prostate with lower urinary tract symptoms: Secondary | ICD-10-CM | POA: Diagnosis not present

## 2016-01-23 DIAGNOSIS — Z23 Encounter for immunization: Secondary | ICD-10-CM | POA: Diagnosis not present

## 2016-01-29 ENCOUNTER — Other Ambulatory Visit: Payer: Self-pay | Admitting: Internal Medicine

## 2016-01-29 DIAGNOSIS — L821 Other seborrheic keratosis: Secondary | ICD-10-CM | POA: Diagnosis not present

## 2016-01-29 DIAGNOSIS — B351 Tinea unguium: Secondary | ICD-10-CM | POA: Diagnosis not present

## 2016-01-29 DIAGNOSIS — L570 Actinic keratosis: Secondary | ICD-10-CM | POA: Diagnosis not present

## 2016-01-29 DIAGNOSIS — L309 Dermatitis, unspecified: Secondary | ICD-10-CM | POA: Diagnosis not present

## 2016-02-28 DIAGNOSIS — J069 Acute upper respiratory infection, unspecified: Secondary | ICD-10-CM | POA: Diagnosis not present

## 2016-02-28 DIAGNOSIS — Z1389 Encounter for screening for other disorder: Secondary | ICD-10-CM | POA: Diagnosis not present

## 2016-03-01 DIAGNOSIS — H401132 Primary open-angle glaucoma, bilateral, moderate stage: Secondary | ICD-10-CM | POA: Diagnosis not present

## 2016-03-01 DIAGNOSIS — I10 Essential (primary) hypertension: Secondary | ICD-10-CM | POA: Diagnosis not present

## 2016-03-01 DIAGNOSIS — Z9842 Cataract extraction status, left eye: Secondary | ICD-10-CM | POA: Diagnosis not present

## 2016-03-01 DIAGNOSIS — Z9841 Cataract extraction status, right eye: Secondary | ICD-10-CM | POA: Diagnosis not present

## 2016-03-01 DIAGNOSIS — H02834 Dermatochalasis of left upper eyelid: Secondary | ICD-10-CM | POA: Diagnosis not present

## 2016-03-01 DIAGNOSIS — Z79899 Other long term (current) drug therapy: Secondary | ICD-10-CM | POA: Diagnosis not present

## 2016-03-01 DIAGNOSIS — E1151 Type 2 diabetes mellitus with diabetic peripheral angiopathy without gangrene: Secondary | ICD-10-CM | POA: Diagnosis not present

## 2016-03-01 DIAGNOSIS — Z87891 Personal history of nicotine dependence: Secondary | ICD-10-CM | POA: Diagnosis not present

## 2016-03-01 DIAGNOSIS — E119 Type 2 diabetes mellitus without complications: Secondary | ICD-10-CM | POA: Diagnosis not present

## 2016-03-01 DIAGNOSIS — H02831 Dermatochalasis of right upper eyelid: Secondary | ICD-10-CM | POA: Diagnosis not present

## 2016-03-01 DIAGNOSIS — Z961 Presence of intraocular lens: Secondary | ICD-10-CM | POA: Diagnosis not present

## 2016-03-12 DIAGNOSIS — E782 Mixed hyperlipidemia: Secondary | ICD-10-CM | POA: Diagnosis not present

## 2016-03-12 DIAGNOSIS — E1165 Type 2 diabetes mellitus with hyperglycemia: Secondary | ICD-10-CM | POA: Diagnosis not present

## 2016-03-12 DIAGNOSIS — I10 Essential (primary) hypertension: Secondary | ICD-10-CM | POA: Diagnosis not present

## 2016-03-12 DIAGNOSIS — I2510 Atherosclerotic heart disease of native coronary artery without angina pectoris: Secondary | ICD-10-CM | POA: Diagnosis not present

## 2016-03-12 DIAGNOSIS — E1151 Type 2 diabetes mellitus with diabetic peripheral angiopathy without gangrene: Secondary | ICD-10-CM | POA: Diagnosis not present

## 2016-04-01 DIAGNOSIS — N183 Chronic kidney disease, stage 3 (moderate): Secondary | ICD-10-CM | POA: Diagnosis not present

## 2016-04-01 DIAGNOSIS — E119 Type 2 diabetes mellitus without complications: Secondary | ICD-10-CM | POA: Diagnosis not present

## 2016-04-01 DIAGNOSIS — E1129 Type 2 diabetes mellitus with other diabetic kidney complication: Secondary | ICD-10-CM | POA: Diagnosis not present

## 2016-04-01 DIAGNOSIS — R809 Proteinuria, unspecified: Secondary | ICD-10-CM | POA: Diagnosis not present

## 2016-04-01 DIAGNOSIS — I10 Essential (primary) hypertension: Secondary | ICD-10-CM | POA: Diagnosis not present

## 2016-04-11 ENCOUNTER — Other Ambulatory Visit (HOSPITAL_COMMUNITY): Payer: Self-pay | Admitting: Nephrology

## 2016-04-11 DIAGNOSIS — R011 Cardiac murmur, unspecified: Secondary | ICD-10-CM

## 2016-04-18 ENCOUNTER — Ambulatory Visit (HOSPITAL_COMMUNITY)
Admission: RE | Admit: 2016-04-18 | Discharge: 2016-04-18 | Disposition: A | Discharge status: Home / Self Care | Payer: Medicare Other | Source: Ambulatory Visit | Attending: Oncology | Admitting: Oncology

## 2016-04-18 DIAGNOSIS — E785 Hyperlipidemia, unspecified: Secondary | ICD-10-CM | POA: Insufficient documentation

## 2016-04-18 DIAGNOSIS — R011 Cardiac murmur, unspecified: Secondary | ICD-10-CM | POA: Diagnosis not present

## 2016-04-18 DIAGNOSIS — I739 Peripheral vascular disease, unspecified: Secondary | ICD-10-CM | POA: Insufficient documentation

## 2016-04-18 DIAGNOSIS — I10 Essential (primary) hypertension: Secondary | ICD-10-CM | POA: Diagnosis not present

## 2016-04-18 DIAGNOSIS — I082 Rheumatic disorders of both aortic and tricuspid valves: Secondary | ICD-10-CM | POA: Diagnosis not present

## 2016-04-18 NOTE — Progress Notes (Signed)
*  PRELIMINARY RESULTS* Echocardiogram 2D Echocardiogram has been performed.  [name] 04/18/2016, 10:54 AM

## 2016-06-12 ENCOUNTER — Ambulatory Visit (INDEPENDENT_AMBULATORY_CARE_PROVIDER_SITE_OTHER): Payer: Medicare Other | Admitting: Internal Medicine

## 2016-06-12 VITALS — BP 114/74 | HR 52 | Ht 71.0 in | Wt 225.0 lb

## 2016-06-12 DIAGNOSIS — I739 Peripheral vascular disease, unspecified: Secondary | ICD-10-CM

## 2016-06-12 DIAGNOSIS — I350 Nonrheumatic aortic (valve) stenosis: Secondary | ICD-10-CM | POA: Diagnosis not present

## 2016-06-12 DIAGNOSIS — I10 Essential (primary) hypertension: Secondary | ICD-10-CM | POA: Diagnosis not present

## 2016-06-12 DIAGNOSIS — I4510 Unspecified right bundle-branch block: Secondary | ICD-10-CM

## 2016-06-12 DIAGNOSIS — E782 Mixed hyperlipidemia: Secondary | ICD-10-CM | POA: Diagnosis not present

## 2016-06-12 NOTE — Progress Notes (Signed)
OFFICE NOTE  Chief Complaint:  Routine follow-up, bilateral knee pain  Primary Care Physician: Redmond School, MD  HPI:  [name]  is an 83 year old gentleman with history of hypertension, dyslipidemia and peripheral vascular disease. He did have claudication and in May 2012 he had high-grade mid SFA stenosis. He had a stent placed in his SFA which improved his claudication and his Doppler studies. In December 2012 his left ABI was 0.61, the right ABI 0.74 with a patent stent. He was seen back by Dr. Gwenlyn Found and recommended followup with me in the office today. His only complaints now are about occasional dizziness especially with change in position, also leg pain with walking but not claudication, mostly joint pain, hip pain which could represent arthritis, wearing glasses.   I the pleasure seeing [name] back today. It's been over a year and a half since I saw him last in the office. He reports doing fairly well and denies any chest pain or worsening shortness of breath. He denies any claudication in his legs. Blood pressure is noted to be elevated somewhat today. He is previous film lisinopril and this was stopped for unknown reasons. Not on aspirin but continues to take Plavix for his PAD.  [name] returns today for follow-up. Overall he is doing fairly well though has complaints about particular right hip pain and knee pain. He also reports some swelling in his legs gets worse during the day but improves with elevating them overnight. He says he gets some numbness and tingling in his feet as well. He had arterial Dopplers last year which show stable ABIs and no evidence of stent occlusion. He is due for repeat arterial Dopplers in April of this year. He denies any chest pain or worsening shortness of breath.  06/12/2016  [name] returns today for follow-up. Recently he saw his nephrologist to auscultated a systolic heart murmur. An echocardiogram was ordered at [facility]. This demonstrated an EF of 60-65%, there was mild to moderate aortic stenosis with a mean gradient of 15 mmHg and calculated valve area of 1.3-1.4 cm. He denies chest pain, worsening shortness of breath, presyncope, syncope or congestive heart failure symptoms. His only other complaint today is bilateral knee pain. He has a history of knee surgery and has also been having some right hip pain. Blood pressure is at goal. He is on atorvastatin 40 mg daily followed by his primary care provider. He has a history of PAD with lower extremity arterial Dopplers in April 2017 that showed no obstructive PAD and a patent stent.  PMHx:  Past Medical History:  Diagnosis Date  . Arthritis   . Borderline diabetic   . Glaucoma   . Hyperlipidemia   . Hypertension   . PVD (peripheral vascular disease) ([place])   . Sleep apnea    Cpap ordered but doesnt use    Past Surgical History:  Procedure Laterality Date  . APPENDECTOMY  1943  . CATARACT EXTRACTION  2012   x2  . COLONOSCOPY N/A 11/01/2014   Procedure: COLONOSCOPY;  Surgeon: Aviva Signs, MD;  Location: [facility];  Service: Gastroenterology;  Laterality: N/A;  . ESOPHAGOGASTRODUODENOSCOPY N/A 11/01/2014   Procedure: ESOPHAGOGASTRODUODENOSCOPY (EGD);  Surgeon: Aviva Signs, MD;  Location: [facility];  Service: Gastroenterology;  Laterality: N/A;  . HERNIA REPAIR Left   . INGUINAL HERNIA REPAIR Right 03/01/2013   Procedure: RIGHT INGUINAL HERNIORRHAPHY;  Surgeon: Jamesetta So, MD;  Location: [facility] ORS;  Service:  General;  Laterality: Right;  . INSERTION OF MESH Right 03/01/2013   Procedure: INSERTION OF MESH;  Surgeon: Jamesetta So, MD;  Location: [facility] ORS;  Service: General;  Laterality: Right;  . NM [place]  06/05/2010   no significant ischemia  . stent  06/14/2010   left leg  . US ECHOCARDIOGRAPHY  01/21/2006   moderate mitral annular ca+, mild MR, AOV moderately sclerotic.    FAMHx:  Family History  Problem  Relation Age of Onset  . Cancer Mother   . Heart attack Father   . Stroke Father   . Heart attack Brother   . Heart attack Brother     SOCHx:   reports that he quit smoking about 81 years ago. He has quit using smokeless tobacco. His smokeless tobacco use included Chew. He reports that he does not drink alcohol or use drugs.  ALLERGIES:  Allergies  Allergen Reactions  . Iodine-131 Other (See Comments)    Breakout, fever  . Iohexol      Code: RASH, Desc: PT STATES HOT ALL OVER HAD TO HAVE ICE PACKS ALL OVER HIM AND DIFF BREATHING, PT REFUSED IV DYE     ROS: Pertinent items noted in HPI and remainder of comprehensive ROS otherwise negative.  HOME MEDS: Current Outpatient Prescriptions  Medication Sig Dispense Refill  . atorvastatin (LIPITOR) 40 MG tablet take 1 tablet by mouth once daily 90 tablet 3  . calcium carbonate (TUMS - DOSED IN MG ELEMENTAL CALCIUM) 500 MG chewable tablet Chew 1 tablet by mouth as needed for indigestion or heartburn.    . cholecalciferol (VITAMIN D) 1000 UNITS tablet Take 1,000 Units by mouth daily.    . clopidogrel (PLAVIX) 75 MG tablet take 1 tablet by mouth once daily 90 tablet 3  . dorzolamide-timolol (COSOPT) 22.3-6.8 MG/ML ophthalmic solution Place 1 drop into both eyes 2 (two) times daily.    . finasteride (PROSCAR) 5 MG tablet Take 5 mg by mouth daily.    [name] glipiZIDE (GLUCOTROL) 5 MG tablet Take 5 mg by mouth daily before breakfast.    . glucosamine-chondroitin 500-400 MG tablet Take 3 tablets by mouth 2 (two) times daily.     . hydrochlorothiazide (HYDRODIURIL) 12.5 MG tablet Take 12.5 mg by mouth daily.  0  . lisinopril (PRINIVIL,ZESTRIL) 10 MG tablet take 1 tablet by mouth once daily 90 tablet 3  . Methylcellulose, Laxative, (FIBER THERAPY PO) Take by mouth daily.    [name] pyridOXINE (VITAMIN B-6) 100 MG tablet Take 100 mg by mouth daily.    . tamsulosin (FLOMAX) 0.4 MG CAPS Take 0.4 mg by mouth daily.    . vitamin C (ASCORBIC ACID) 500 MG tablet  Take 500 mg by mouth daily.    . vitamin E 400 UNIT capsule Take 400 Units by mouth daily.    [name] zinc gluconate 50 MG tablet Take 50 mg by mouth daily.     No current facility-administered medications for this visit.     LABS/IMAGING: No results found for this or any previous visit (from the past 48 hour(s)). No results found.  VITALS: BP 114/74   Pulse (!) 52   Ht 5\' 11"  (1.803 m)   Wt 225 lb (102.1 kg)   BMI 31.38 kg/m   EXAM: General appearance: alert and no distress Neck: no carotid bruit and no JVD Lungs: clear to auscultation bilaterally Heart: regular rate and rhythm, S1, S2 normal and systolic murmur: early systolic 3/6, crescendo at 2nd right  intercostal space Abdomen: soft, non-tender; bowel sounds normal; no masses,  no organomegaly Extremities: extremities normal, atraumatic, no cyanosis or edema Pulses: 2+ and symmetric Skin: Skin color, texture, turgor normal. No rashes or lesions Neurologic: Grossly normal  EKG: Sinus bradycardia with PACs at 52, first-degree AV block and RBBB  ASSESSMENT: 1. Mild to moderate aortic stenosis 2. New RBBB/bifascicular block 3. PAD status post right SFA stent -stable dopplers, no claudication 4. Hypertension- controlled 5. Dizziness 6. Dyslipidemia 7. Asymptomatic bradycardia   PLAN: 1.   Mr. Lindblad was found to have newly identified mild to moderate aortic stenosis. LV function is preserved on echo. Will need to follow this up with a repeat echo in one year. EKG today shows a new right bundle branch block/bifascicular block with a mildly prolonged PR interval. He's had bradycardia in the past were specifically avoiding any AV nodal blocking agents. He seems asymptomatic with this. He denies any new symptoms of claudication and has a patent right SFA stent by Dopplers in April 2017. Blood pressure is well-controlled today. He is on appropriate cholesterol therapy with atorvastatin 40 mg daily.  Follow-up with me annually after  his echocardiogram.  Pixie Casino, MD, [facility] Attending Cardiologist [place] 06/12/2016, 12:19 PM

## 2016-06-12 NOTE — Patient Instructions (Signed)
Your physician has requested that you have an echocardiogram in [place] @ [address] - [address]rd [address]. Echocardiography is a painless test that uses sound waves to create images of your heart. It provides your doctor with information about the size and shape of your heart and how well your heart's chambers and valves are working. This procedure takes approximately one hour. There are no restrictions for this procedure.  Your physician wants you to follow-up in: ONE YEAR with Dr. Debara Pickett. You will receive a reminder letter in the mail two months in advance. If you don't receive a letter, please call our office to schedule the follow-up appointment.

## 2016-06-26 ENCOUNTER — Other Ambulatory Visit (HOSPITAL_COMMUNITY): Payer: Self-pay

## 2016-07-02 DIAGNOSIS — I10 Essential (primary) hypertension: Secondary | ICD-10-CM | POA: Diagnosis not present

## 2016-07-02 DIAGNOSIS — E1122 Type 2 diabetes mellitus with diabetic chronic kidney disease: Secondary | ICD-10-CM | POA: Diagnosis not present

## 2016-07-02 DIAGNOSIS — E1165 Type 2 diabetes mellitus with hyperglycemia: Secondary | ICD-10-CM | POA: Diagnosis not present

## 2016-07-02 DIAGNOSIS — E782 Mixed hyperlipidemia: Secondary | ICD-10-CM | POA: Diagnosis not present

## 2016-07-02 DIAGNOSIS — N183 Chronic kidney disease, stage 3 (moderate): Secondary | ICD-10-CM | POA: Diagnosis not present

## 2016-07-02 DIAGNOSIS — I2510 Atherosclerotic heart disease of native coronary artery without angina pectoris: Secondary | ICD-10-CM | POA: Diagnosis not present

## 2016-07-02 DIAGNOSIS — E1129 Type 2 diabetes mellitus with other diabetic kidney complication: Secondary | ICD-10-CM | POA: Diagnosis not present

## 2016-07-02 DIAGNOSIS — Z1389 Encounter for screening for other disorder: Secondary | ICD-10-CM | POA: Diagnosis not present

## 2016-08-08 DIAGNOSIS — H9193 Unspecified hearing loss, bilateral: Secondary | ICD-10-CM | POA: Diagnosis not present

## 2016-08-08 DIAGNOSIS — R350 Frequency of micturition: Secondary | ICD-10-CM | POA: Diagnosis not present

## 2016-08-08 DIAGNOSIS — H6503 Acute serous otitis media, bilateral: Secondary | ICD-10-CM | POA: Diagnosis not present

## 2016-08-13 DIAGNOSIS — R3912 Poor urinary stream: Secondary | ICD-10-CM | POA: Diagnosis not present

## 2016-08-13 DIAGNOSIS — M545 Low back pain: Secondary | ICD-10-CM | POA: Diagnosis not present

## 2016-08-20 ENCOUNTER — Other Ambulatory Visit: Payer: Self-pay | Admitting: Cardiovascular Disease

## 2016-08-20 ENCOUNTER — Encounter: Payer: Self-pay | Admitting: Cardiovascular Disease

## 2016-08-20 ENCOUNTER — Ambulatory Visit (HOSPITAL_COMMUNITY)
Admission: RE | Admit: 2016-08-20 | Discharge: 2016-08-20 | Disposition: A | Discharge status: Home / Self Care | Payer: Medicare Other | Source: Ambulatory Visit | Attending: Cardiovascular Disease | Admitting: Cardiovascular Disease

## 2016-08-20 ENCOUNTER — Ambulatory Visit (INDEPENDENT_AMBULATORY_CARE_PROVIDER_SITE_OTHER): Payer: Medicare Other | Admitting: Cardiovascular Disease

## 2016-08-20 ENCOUNTER — Telehealth: Payer: Self-pay | Admitting: Internal Medicine

## 2016-08-20 DIAGNOSIS — I739 Peripheral vascular disease, unspecified: Secondary | ICD-10-CM | POA: Diagnosis not present

## 2016-08-20 NOTE — Telephone Encounter (Signed)
Patient daughter calling, states that her father's left leg is numb and this is the same leg that the stent was installed. Patient is also having some trouble walking. No other symptoms reported

## 2016-08-20 NOTE — Progress Notes (Signed)
08/20/2016 [name]   [birth year]  [id number]  Primary Physician Travis School, MD Primary Cardiologist: Lorretta Harp MD [name], [place]  HPI:  [name] is a 83 y.o. male accompanied by his daughter [name] today. He was last seen by Dr. Debara Johnston 06/12/16. He has a history of hypertension, hyperlipidemia, obstructive sleep apnea and peripheral vascular disease. I apparently stented his left SFA May 2012. He complains of numbness in his lower extremities specifically his right foot and left leg. His Dopplers performed today revealed right ABI 0.82 and a left ABI of 0.84. He has palpable pedal pulses on exam.   Current Meds  Medication Sig  . atorvastatin (LIPITOR) 40 MG tablet take 1 tablet by mouth once daily  . calcium carbonate (TUMS - DOSED IN MG ELEMENTAL CALCIUM) 500 MG chewable tablet Chew 1 tablet by mouth as needed for indigestion or heartburn.  . cholecalciferol (VITAMIN D) 1000 UNITS tablet Take 1,000 Units by mouth daily.  . clopidogrel (PLAVIX) 75 MG tablet take 1 tablet by mouth once daily  . dorzolamide-timolol (COSOPT) 22.3-6.8 MG/ML ophthalmic solution Place 1 drop into both eyes 2 (two) times daily.  . finasteride (PROSCAR) 5 MG tablet Take 5 mg by mouth daily.  [name] glipiZIDE (GLUCOTROL) 5 MG tablet Take 5 mg by mouth daily before breakfast.  . glucosamine-chondroitin 500-400 MG tablet Take 3 tablets by mouth 2 (two) times daily.   . hydrochlorothiazide (HYDRODIURIL) 12.5 MG tablet Take 12.5 mg by mouth daily.  [name] levothyroxine (SYNTHROID, LEVOTHROID) 50 MCG tablet Take 1 tablet by mouth daily.  [name] lisinopril (PRINIVIL,ZESTRIL) 5 MG tablet Take 5 mg by mouth daily.  . Methylcellulose, Laxative, (FIBER THERAPY PO) Take by mouth daily.  [name] pyridOXINE (VITAMIN B-6) 100 MG tablet Take 100 mg by mouth daily.  . tamsulosin (FLOMAX) 0.4 MG CAPS Take 0.4 mg by mouth daily.  . vitamin C (ASCORBIC ACID) 500 MG tablet Take 500 mg by mouth daily.  . vitamin E 400  UNIT capsule Take 400 Units by mouth daily.  [name] zinc gluconate 50 MG tablet Take 50 mg by mouth daily.     Allergies  Allergen Reactions  . Iodine-131 Other (See Comments)    Breakout, fever  . Iohexol      Code: RASH, Desc: PT STATES HOT ALL OVER HAD TO HAVE ICE PACKS ALL OVER HIM AND DIFF BREATHING, PT REFUSED IV DYE     Social History   Social History  . Marital status: Widowed    Spouse name: N/A  . Number of children: N/A  . Years of education: N/A   Occupational History  . Not on file.   Social History Main Topics  . Smoking status: Former Smoker    Quit date: 01/14/1935  . Smokeless tobacco: Former Systems developer    Types: Chew  . Alcohol use No  . Drug use: No  . Sexual activity: Not on file   Other Topics Concern  . Not on file   Social History Narrative  . No narrative on file     Review of Systems: General: negative for chills, fever, night sweats or weight changes.  Cardiovascular: negative for chest pain, dyspnea on exertion, edema, orthopnea, palpitations, paroxysmal nocturnal dyspnea or shortness of breath Dermatological: negative for rash Respiratory: negative for cough or wheezing Urologic: negative for hematuria Abdominal: negative for nausea, vomiting, diarrhea, bright red blood per rectum, melena, or hematemesis Neurologic: negative for visual changes, syncope, or dizziness All  other systems reviewed and are otherwise negative except as noted above.    Blood pressure (!) 142/82, pulse 64, height 5' 11.5" (1.816 m), weight 224 lb (101.6 kg), SpO2 96 %.  General appearance: alert and no distress Neck: no adenopathy, no carotid bruit, no JVD, supple, symmetrical, trachea midline and thyroid not enlarged, symmetric, no tenderness/mass/nodules Lungs: clear to auscultation bilaterally Heart: regular rate and rhythm, S1, S2 normal, no murmur, click, rub or gallop Extremities: extremities normal, atraumatic, no cyanosis or edema  EKG not performed  today.  ASSESSMENT AND PLAN:   PAD (peripheral artery disease) [facility]) Mr. Swatzell is being seen today for numbness in both lower extremities specifically in his right feet and left pretibial area. He was complaining of this several months ago as well when he saw Dr. Debara Johnston in the office. He does have diabetes on glipizide. He had Dopplers done today that showed ABIs in the low 0.8 range with excellent waveforms. He has palpable pedal pulses. I do not think his symptoms are vascular in nature but more likely neuropathic versus neurologic.      Lorretta Harp MD [place], [facility] 08/20/2016 4:33 PM

## 2016-08-20 NOTE — Telephone Encounter (Signed)
Spoke with patient and he started having left leg pain/numbness few days ago but seems to be getting worse. This is the leg in which stent for PAD several years ago. He can not remember symptoms prior to stent. Stated he does feel some numbness in his right toes as well. Patient denies any weakness in left side, facial drooping, or slurred speech. Discussed with Lovena Le and scheduled appointment with Dr Gwenlyn Found today. Patient is scheduled for follow up vascular studies prior to ov as he is due for yearly recheck.

## 2016-08-20 NOTE — Assessment & Plan Note (Signed)
[name] is being seen today for numbness in both lower extremities specifically in his right feet and left pretibial area. He was complaining of this several months ago as well when he saw Dr. Debara Pickett in the office. He does have diabetes on glipizide. He had Dopplers done today that showed ABIs in the low 0.8 range with excellent waveforms. He has palpable pedal pulses. I do not think his symptoms are vascular in nature but more likely neuropathic versus neurologic.

## 2016-08-20 NOTE — Telephone Encounter (Signed)
Spoke with patient and he stated

## 2016-08-20 NOTE — Patient Instructions (Signed)
Medication Instructions: Your physician recommends that you continue on your current medications as directed. Please refer to the Current Medication list given to you today.   Follow-Up: Your physician recommends that you schedule a follow-up appointment as needed with Dr. Berry.    

## 2016-08-23 DIAGNOSIS — I10 Essential (primary) hypertension: Secondary | ICD-10-CM | POA: Diagnosis not present

## 2016-08-23 DIAGNOSIS — Z961 Presence of intraocular lens: Secondary | ICD-10-CM | POA: Diagnosis not present

## 2016-08-23 DIAGNOSIS — H02833 Dermatochalasis of right eye, unspecified eyelid: Secondary | ICD-10-CM | POA: Diagnosis not present

## 2016-08-23 DIAGNOSIS — H5210 Myopia, unspecified eye: Secondary | ICD-10-CM | POA: Diagnosis not present

## 2016-08-23 DIAGNOSIS — Z87891 Personal history of nicotine dependence: Secondary | ICD-10-CM | POA: Diagnosis not present

## 2016-08-23 DIAGNOSIS — Z7984 Long term (current) use of oral hypoglycemic drugs: Secondary | ICD-10-CM | POA: Diagnosis not present

## 2016-08-23 DIAGNOSIS — Z9842 Cataract extraction status, left eye: Secondary | ICD-10-CM | POA: Diagnosis not present

## 2016-08-23 DIAGNOSIS — Z9841 Cataract extraction status, right eye: Secondary | ICD-10-CM | POA: Diagnosis not present

## 2016-08-23 DIAGNOSIS — D3132 Benign neoplasm of left choroid: Secondary | ICD-10-CM | POA: Diagnosis not present

## 2016-08-23 DIAGNOSIS — E119 Type 2 diabetes mellitus without complications: Secondary | ICD-10-CM | POA: Diagnosis not present

## 2016-08-23 DIAGNOSIS — H02836 Dermatochalasis of left eye, unspecified eyelid: Secondary | ICD-10-CM | POA: Diagnosis not present

## 2016-08-23 DIAGNOSIS — H35363 Drusen (degenerative) of macula, bilateral: Secondary | ICD-10-CM | POA: Diagnosis not present

## 2016-08-23 DIAGNOSIS — H401132 Primary open-angle glaucoma, bilateral, moderate stage: Secondary | ICD-10-CM | POA: Diagnosis not present

## 2016-09-05 DIAGNOSIS — G8929 Other chronic pain: Secondary | ICD-10-CM | POA: Diagnosis not present

## 2016-09-05 DIAGNOSIS — M25562 Pain in left knee: Secondary | ICD-10-CM | POA: Diagnosis not present

## 2016-09-05 DIAGNOSIS — S86812A Strain of other muscle(s) and tendon(s) at lower leg level, left leg, initial encounter: Secondary | ICD-10-CM | POA: Diagnosis not present

## 2016-09-23 DIAGNOSIS — M25562 Pain in left knee: Secondary | ICD-10-CM | POA: Diagnosis not present

## 2016-09-23 DIAGNOSIS — G8929 Other chronic pain: Secondary | ICD-10-CM | POA: Diagnosis not present

## 2016-10-03 DIAGNOSIS — D509 Iron deficiency anemia, unspecified: Secondary | ICD-10-CM | POA: Diagnosis not present

## 2016-10-03 DIAGNOSIS — R809 Proteinuria, unspecified: Secondary | ICD-10-CM | POA: Diagnosis not present

## 2016-10-03 DIAGNOSIS — E559 Vitamin D deficiency, unspecified: Secondary | ICD-10-CM | POA: Diagnosis not present

## 2016-10-03 DIAGNOSIS — I10 Essential (primary) hypertension: Secondary | ICD-10-CM | POA: Diagnosis not present

## 2016-10-03 DIAGNOSIS — Z79899 Other long term (current) drug therapy: Secondary | ICD-10-CM | POA: Diagnosis not present

## 2016-10-03 DIAGNOSIS — N183 Chronic kidney disease, stage 3 (moderate): Secondary | ICD-10-CM | POA: Diagnosis not present

## 2016-10-09 DIAGNOSIS — I350 Nonrheumatic aortic (valve) stenosis: Secondary | ICD-10-CM | POA: Diagnosis not present

## 2016-10-09 DIAGNOSIS — N183 Chronic kidney disease, stage 3 (moderate): Secondary | ICD-10-CM | POA: Diagnosis not present

## 2016-10-09 DIAGNOSIS — D638 Anemia in other chronic diseases classified elsewhere: Secondary | ICD-10-CM | POA: Diagnosis not present

## 2016-10-16 DIAGNOSIS — I10 Essential (primary) hypertension: Secondary | ICD-10-CM | POA: Diagnosis not present

## 2016-10-16 DIAGNOSIS — K5730 Diverticulosis of large intestine without perforation or abscess without bleeding: Secondary | ICD-10-CM | POA: Diagnosis not present

## 2016-10-16 DIAGNOSIS — J302 Other seasonal allergic rhinitis: Secondary | ICD-10-CM | POA: Diagnosis not present

## 2016-10-16 DIAGNOSIS — E1151 Type 2 diabetes mellitus with diabetic peripheral angiopathy without gangrene: Secondary | ICD-10-CM | POA: Diagnosis not present

## 2016-10-16 DIAGNOSIS — N183 Chronic kidney disease, stage 3 (moderate): Secondary | ICD-10-CM | POA: Diagnosis not present

## 2016-10-16 DIAGNOSIS — E1165 Type 2 diabetes mellitus with hyperglycemia: Secondary | ICD-10-CM | POA: Diagnosis not present

## 2016-10-31 ENCOUNTER — Other Ambulatory Visit: Payer: Self-pay | Admitting: Internal Medicine

## 2016-11-19 DIAGNOSIS — J302 Other seasonal allergic rhinitis: Secondary | ICD-10-CM | POA: Diagnosis not present

## 2016-11-19 DIAGNOSIS — J310 Chronic rhinitis: Secondary | ICD-10-CM | POA: Diagnosis not present

## 2016-11-26 DIAGNOSIS — E039 Hypothyroidism, unspecified: Secondary | ICD-10-CM | POA: Diagnosis not present

## 2016-11-26 DIAGNOSIS — Z1389 Encounter for screening for other disorder: Secondary | ICD-10-CM | POA: Diagnosis not present

## 2016-11-26 DIAGNOSIS — Z0000 Encounter for general adult medical examination without abnormal findings: Secondary | ICD-10-CM | POA: Diagnosis not present

## 2016-11-26 DIAGNOSIS — I10 Essential (primary) hypertension: Secondary | ICD-10-CM | POA: Diagnosis not present

## 2016-11-26 DIAGNOSIS — R946 Abnormal results of thyroid function studies: Secondary | ICD-10-CM | POA: Diagnosis not present

## 2016-11-26 DIAGNOSIS — E782 Mixed hyperlipidemia: Secondary | ICD-10-CM | POA: Diagnosis not present

## 2017-01-02 DIAGNOSIS — N401 Enlarged prostate with lower urinary tract symptoms: Secondary | ICD-10-CM | POA: Diagnosis not present

## 2017-01-27 DIAGNOSIS — J029 Acute pharyngitis, unspecified: Secondary | ICD-10-CM | POA: Diagnosis not present

## 2017-01-27 DIAGNOSIS — J069 Acute upper respiratory infection, unspecified: Secondary | ICD-10-CM | POA: Diagnosis not present

## 2017-01-27 DIAGNOSIS — Z1389 Encounter for screening for other disorder: Secondary | ICD-10-CM | POA: Diagnosis not present

## 2017-01-31 ENCOUNTER — Ambulatory Visit (HOSPITAL_COMMUNITY)
Admission: RE | Admit: 2017-01-31 | Discharge: 2017-01-31 | Disposition: A | Discharge status: Home / Self Care | Payer: Medicare Other | Source: Ambulatory Visit | Attending: Family Medicine | Admitting: Family Medicine

## 2017-01-31 ENCOUNTER — Other Ambulatory Visit (HOSPITAL_COMMUNITY): Payer: Self-pay | Admitting: Family Medicine

## 2017-01-31 DIAGNOSIS — J40 Bronchitis, not specified as acute or chronic: Secondary | ICD-10-CM | POA: Insufficient documentation

## 2017-01-31 DIAGNOSIS — J069 Acute upper respiratory infection, unspecified: Secondary | ICD-10-CM

## 2017-01-31 DIAGNOSIS — R05 Cough: Secondary | ICD-10-CM | POA: Diagnosis not present

## 2017-02-03 DIAGNOSIS — L570 Actinic keratosis: Secondary | ICD-10-CM | POA: Diagnosis not present

## 2017-02-03 DIAGNOSIS — L821 Other seborrheic keratosis: Secondary | ICD-10-CM | POA: Diagnosis not present

## 2017-02-03 DIAGNOSIS — D225 Melanocytic nevi of trunk: Secondary | ICD-10-CM | POA: Diagnosis not present

## 2017-02-03 DIAGNOSIS — L820 Inflamed seborrheic keratosis: Secondary | ICD-10-CM | POA: Diagnosis not present

## 2017-02-03 DIAGNOSIS — D485 Neoplasm of uncertain behavior of skin: Secondary | ICD-10-CM | POA: Diagnosis not present

## 2017-03-04 DIAGNOSIS — J069 Acute upper respiratory infection, unspecified: Secondary | ICD-10-CM | POA: Diagnosis not present

## 2017-03-04 DIAGNOSIS — J302 Other seasonal allergic rhinitis: Secondary | ICD-10-CM | POA: Diagnosis not present

## 2017-03-31 DIAGNOSIS — Z8669 Personal history of other diseases of the nervous system and sense organs: Secondary | ICD-10-CM | POA: Diagnosis not present

## 2017-03-31 DIAGNOSIS — Z961 Presence of intraocular lens: Secondary | ICD-10-CM | POA: Diagnosis not present

## 2017-03-31 DIAGNOSIS — Z7984 Long term (current) use of oral hypoglycemic drugs: Secondary | ICD-10-CM | POA: Diagnosis not present

## 2017-03-31 DIAGNOSIS — H401132 Primary open-angle glaucoma, bilateral, moderate stage: Secondary | ICD-10-CM | POA: Diagnosis not present

## 2017-03-31 DIAGNOSIS — E119 Type 2 diabetes mellitus without complications: Secondary | ICD-10-CM | POA: Diagnosis not present

## 2017-03-31 DIAGNOSIS — H5210 Myopia, unspecified eye: Secondary | ICD-10-CM | POA: Diagnosis not present

## 2017-03-31 DIAGNOSIS — D3132 Benign neoplasm of left choroid: Secondary | ICD-10-CM | POA: Diagnosis not present

## 2017-04-07 DIAGNOSIS — D509 Iron deficiency anemia, unspecified: Secondary | ICD-10-CM | POA: Diagnosis not present

## 2017-04-07 DIAGNOSIS — E559 Vitamin D deficiency, unspecified: Secondary | ICD-10-CM | POA: Diagnosis not present

## 2017-04-07 DIAGNOSIS — Z79899 Other long term (current) drug therapy: Secondary | ICD-10-CM | POA: Diagnosis not present

## 2017-04-07 DIAGNOSIS — N183 Chronic kidney disease, stage 3 (moderate): Secondary | ICD-10-CM | POA: Diagnosis not present

## 2017-04-07 DIAGNOSIS — R809 Proteinuria, unspecified: Secondary | ICD-10-CM | POA: Diagnosis not present

## 2017-04-09 DIAGNOSIS — N183 Chronic kidney disease, stage 3 (moderate): Secondary | ICD-10-CM | POA: Diagnosis not present

## 2017-04-09 DIAGNOSIS — D638 Anemia in other chronic diseases classified elsewhere: Secondary | ICD-10-CM | POA: Diagnosis not present

## 2017-04-18 DIAGNOSIS — J069 Acute upper respiratory infection, unspecified: Secondary | ICD-10-CM | POA: Diagnosis not present

## 2017-04-18 DIAGNOSIS — Z1389 Encounter for screening for other disorder: Secondary | ICD-10-CM | POA: Diagnosis not present

## 2017-04-18 DIAGNOSIS — H60502 Unspecified acute noninfective otitis externa, left ear: Secondary | ICD-10-CM | POA: Diagnosis not present

## 2017-04-18 DIAGNOSIS — R195 Other fecal abnormalities: Secondary | ICD-10-CM | POA: Diagnosis not present

## 2017-04-23 DIAGNOSIS — Z1211 Encounter for screening for malignant neoplasm of colon: Secondary | ICD-10-CM | POA: Diagnosis not present

## 2017-05-08 ENCOUNTER — Telehealth: Payer: Self-pay | Admitting: Internal Medicine

## 2017-05-08 NOTE — Telephone Encounter (Signed)
New Message  Daughter calling, doesn't know much information, just that pt is having trouble walking due to swelling in legs  Pt c/o swelling: STAT is pt has developed SOB within 24 hours  1) How much weight have you gained and in what time span? Not sure  2) If swelling, where is the swelling located? legs  3) Are you currently taking a fluid pill? n/a  4) Are you currently SOB? no  5) Do you have a log of your daily weights (if so, list)? no  6) Have you gained 3 pounds in a day or 5 pounds in a week? Not sure  7) Have you traveled recently? no

## 2017-05-08 NOTE — Telephone Encounter (Signed)
Would try to get an appointment with APP or me in the next week or so.  Dr. Lemmie Evens

## 2017-05-08 NOTE — Telephone Encounter (Addendum)
Spoke with Pt's daughter who reports her father has been experiencing some swelling in his legs. She states that it feels like a heaviness feeling and it causing him to have trouble walking. She is unsure as to when it started but reports he just started mentioning it this week. She is also unsure as t0 whether he has gained weight but states her feel as if he has. She reports he denies CP but is experiencing some SOB when walking. Routing to Dr. Debara Pickett for further recommendations and advised if Pt's SOB increases to where he feels he cant catch a good breath to report to ED.

## 2017-05-08 NOTE — Telephone Encounter (Signed)
Returned call to pt and informed of Dr. Lysbeth Penner recommendation. Pt verbalized understanding. Appointment made for 4/26 at 2:45pm with Dr. Debara Pickett.

## 2017-05-09 ENCOUNTER — Ambulatory Visit: Payer: Medicare Other | Admitting: Internal Medicine

## 2017-05-09 VITALS — BP 106/60 | HR 63 | Ht 71.0 in | Wt 234.0 lb

## 2017-05-09 DIAGNOSIS — R0602 Shortness of breath: Secondary | ICD-10-CM | POA: Diagnosis not present

## 2017-05-09 DIAGNOSIS — Z79899 Other long term (current) drug therapy: Secondary | ICD-10-CM | POA: Diagnosis not present

## 2017-05-09 DIAGNOSIS — E039 Hypothyroidism, unspecified: Secondary | ICD-10-CM

## 2017-05-09 DIAGNOSIS — I5033 Acute on chronic diastolic (congestive) heart failure: Secondary | ICD-10-CM | POA: Diagnosis not present

## 2017-05-09 MED ORDER — LISINOPRIL 5 MG PO TABS: 5.0000 mg | ORAL_TABLET | Freq: Every day | ORAL | 5 refills | Status: DC

## 2017-05-09 NOTE — Patient Instructions (Signed)
Medication Instructions:   Take lisinopril 5mg  once daily  Labwork:  BNP, CMET, TSH, Free T4  Testing/Procedures:  Echo as scheduled  Follow-Up:  Appointment as scheduled  If you need a refill on your cardiac medications before your next appointment, please call your pharmacy.  Any Other Special Instructions Will Be Listed Below (If Applicable).

## 2017-05-10 LAB — COMPREHENSIVE METABOLIC PANEL
ALT: 35 IU/L (ref 0–44)
AST: 31 IU/L (ref 0–40)
Albumin/Globulin Ratio: 1.8 (ref 1.2–2.2)
Albumin: 4.6 g/dL (ref 3.5–4.7)
Alkaline Phosphatase: 52 IU/L (ref 39–117)
BUN/Creatinine Ratio: 14 (ref 10–24)
BUN: 20 mg/dL (ref 8–27)
Bilirubin Total: 0.6 mg/dL (ref 0.0–1.2)
CO2: 21 mmol/L (ref 20–29)
Calcium: 9.7 mg/dL (ref 8.6–10.2)
Chloride: 102 mmol/L (ref 96–106)
Creatinine, Ser: 1.45 mg/dL — ABNORMAL HIGH (ref 0.76–1.27)
GFR calc Af Amer: 50 mL/min/{1.73_m2} — ABNORMAL LOW (ref >59)
GFR calc non Af Amer: 43 mL/min/{1.73_m2} — ABNORMAL LOW (ref >59)
Globulin, Total: 2.6 g/dL (ref 1.5–4.5)
Glucose: 108 mg/dL — ABNORMAL HIGH (ref 65–99)
Potassium: 4.1 mmol/L (ref 3.5–5.2)
Sodium: 140 mmol/L (ref 134–144)
Total Protein: 7.2 g/dL (ref 6.0–8.5)

## 2017-05-10 LAB — PRO B NATRIURETIC PEPTIDE: NT-Pro BNP: 496 pg/mL — ABNORMAL HIGH (ref 0–486)

## 2017-05-10 LAB — TSH: TSH: 2.95 u[IU]/mL (ref 0.450–4.500)

## 2017-05-10 LAB — T4, FREE: Free T4: 1.28 ng/dL (ref 0.82–1.77)

## 2017-05-11 ENCOUNTER — Encounter: Payer: Self-pay | Admitting: Internal Medicine

## 2017-05-11 DIAGNOSIS — R0602 Shortness of breath: Secondary | ICD-10-CM | POA: Insufficient documentation

## 2017-05-11 DIAGNOSIS — E039 Hypothyroidism, unspecified: Secondary | ICD-10-CM | POA: Insufficient documentation

## 2017-05-11 DIAGNOSIS — I5032 Chronic diastolic (congestive) heart failure: Secondary | ICD-10-CM | POA: Insufficient documentation

## 2017-05-11 NOTE — Progress Notes (Signed)
OFFICE NOTE  Chief Complaint:  Dyspnea and lower extremity edema  Primary Care Physician: [name], MD  HPI:  [name]  is an 84 year old gentleman with history of hypertension, dyslipidemia and peripheral vascular disease. He did have claudication and in May 2012 he had high-grade mid SFA stenosis. He had a stent placed in his SFA which improved his claudication and his Doppler studies. In December 2012 his left ABI was 0.61, the right ABI 0.74 with a patent stent. He was seen back by Dr. Gwenlyn Found and recommended followup with me in the office today. His only complaints now are about occasional dizziness especially with change in position, also leg pain with walking but not claudication, mostly joint pain, hip pain which could represent arthritis, wearing glasses.   I the pleasure seeing [name] back today. It's been over a year and a half since I saw him last in the office. He reports doing fairly well and denies any chest pain or worsening shortness of breath. He denies any claudication in his legs. Blood pressure is noted to be elevated somewhat today. He is previous film lisinopril and this was stopped for unknown reasons. Not on aspirin but continues to take Plavix for his PAD.  [name] returns today for follow-up. Overall he is doing fairly well though has complaints about particular right hip pain and knee pain. He also reports some swelling in his legs gets worse during the day but improves with elevating them overnight. He says he gets some numbness and tingling in his feet as well. He had arterial Dopplers last year which show stable ABIs and no evidence of stent occlusion. He is due for repeat arterial Dopplers in April of this year. He denies any chest pain or worsening shortness of breath.  06/12/2016  [name] returns today for follow-up. Recently he saw his nephrologist to auscultated a systolic heart murmur. An echocardiogram was ordered at [facility]. This demonstrated an EF of 60-65%, there was mild to moderate aortic stenosis with a mean gradient of 15 mmHg and calculated valve area of 1.3-1.4 cm. He denies chest pain, worsening shortness of breath, presyncope, syncope or congestive heart failure symptoms. His only other complaint today is bilateral knee pain. He has a history of knee surgery and has also been having some right hip pain. Blood pressure is at goal. He is on atorvastatin 40 mg daily followed by his primary care provider. He has a history of PAD with lower extremity arterial Dopplers in April 2017 that showed no obstructive PAD and a patent stent.  05/09/2017  [name] returns today for follow-up.  He is reporting some mild shortness of breath as well as lower extremity edema.  According to records he is gained 6 pounds since we last saw him.  He reports worsening lower extremity swelling.  He was scheduled to have a routine echocardiogram on May 30 and office visit with me on May 31, however came in earlier because of worsening shortness of breath.  EKG just shows sinus rhythm with first-degree AV block at 63, RBBB-personally reviewed.  His daughter was accompanying him today and noted that he has been out of several of his medications.  Namely he has not taken tamsulosin, finasteride and levothyroxine.  He is also out of his hydrochlorothiazide diuretic.  PMHx:  Past Medical History:  Diagnosis Date  . Arthritis   . Borderline diabetic   . Glaucoma   . Hyperlipidemia   . Hypertension   .  PVD (peripheral vascular disease) ([place])   . Sleep apnea    Cpap ordered but doesnt use    Past Surgical History:  Procedure Laterality Date  . APPENDECTOMY  1943  . CATARACT EXTRACTION  2012   x2  . COLONOSCOPY N/A 11/01/2014   Procedure: COLONOSCOPY;  Surgeon: Aviva Signs, MD;  [facility];  Service: Gastroenterology;  Laterality: N/A;  . ESOPHAGOGASTRODUODENOSCOPY N/A 11/01/2014   Procedure:  ESOPHAGOGASTRODUODENOSCOPY (EGD);  Surgeon: Aviva Signs, MD;  [facility];  Service: Gastroenterology;  Laterality: N/A;  . HERNIA REPAIR Left   . INGUINAL HERNIA REPAIR Right 03/01/2013   Procedure: RIGHT INGUINAL HERNIORRHAPHY;  Surgeon: Jamesetta So, MD;  Location: [facility] OR[facility];  Service: General;  Laterality: Right;  . INSERTION OF MESH Right 03/01/2013   Procedure: INSERTION OF MESH;  Surgeon: Jamesetta So, MD;  Location: [facility];  Service: General;  Laterality: Right;  . NM [place]  06/05/2010   no significant ischemia  . stent  06/14/2010   left leg  . US ECHOCARDIOGRAPHY  01/21/2006   moderate mitral annular ca+, mild MR, AOV moderately sclerotic.    FAMHx:  Family History  Problem Relation Age of Onset  . Cancer Mother   . Heart attack Father   . Stroke Father   . Heart attack Brother   . Heart attack Brother     SOCHx:   reports that he quit smoking about 82 years ago. He has quit using smokeless tobacco. His smokeless tobacco use included chew. He reports that he does not drink alcohol or use drugs.  ALLERGIES:  Allergies  Allergen Reactions  . Iodine-131 Other (See Comments)    Breakout, fever  . Iohexol      Code: RASH, Desc: PT STATES HOT ALL OVER HAD TO HAVE ICE PACKS ALL OVER HIM AND DIFF BREATHING, PT REFUSED IV DYE     ROS: Pertinent items noted in HPI and remainder of comprehensive ROS otherwise negative.  HOME MEDS: Current Outpatient Medications  Medication Sig Dispense Refill  . atorvastatin (LIPITOR) 40 MG tablet take 1 tablet by mouth once daily 90 tablet 3  . calcium carbonate (TUMS - DOSED IN MG ELEMENTAL CALCIUM) 500 MG chewable tablet Chew 1 tablet by mouth as needed for indigestion or heartburn.    . cholecalciferol (VITAMIN D) 1000 UNITS tablet Take 1,000 Units by mouth daily.    . clopidogrel (PLAVIX) 75 MG tablet take 1 tablet by mouth once daily 90 tablet 3  . dorzolamide-timolol (COSOPT) 22.3-6.8 MG/ML ophthalmic  solution Place 1 drop into both eyes 2 (two) times daily.    . finasteride (PROSCAR) 5 MG tablet Take 5 mg by mouth daily.    [name] glipiZIDE (GLUCOTROL) 5 MG tablet Take 5 mg by mouth 2 (two) times daily before a meal.     . glucosamine-chondroitin 500-400 MG tablet Take 3 tablets by mouth 2 (two) times daily.     . hydrochlorothiazide (HYDRODIURIL) 12.5 MG tablet Take 12.5 mg by mouth daily.  0  . levothyroxine (SYNTHROID, LEVOTHROID) 50 MCG tablet Take 1 tablet by mouth daily.  0  . lisinopril (PRINIVIL,ZESTRIL) 5 MG tablet Take 1 tablet (5 mg total) by mouth daily. 30 tablet 5  . Methylcellulose, Laxative, (FIBER THERAPY PO) Take by mouth daily.    [name] pyridOXINE (VITAMIN B-6) 100 MG tablet Take 100 mg by mouth daily.    . tamsulosin (FLOMAX) 0.4 MG CAPS Take 0.4 mg by mouth daily.    [name]  vitamin C (ASCORBIC ACID) 500 MG tablet Take 500 mg by mouth daily.    . vitamin E 400 UNIT capsule Take 400 Units by mouth daily.    [name] zinc gluconate 50 MG tablet Take 50 mg by mouth daily.     No current facility-administered medications for this visit.     LABS/IMAGING: No results found for this or any previous visit (from the past 48 hour(s)). No results found.  VITALS: BP 106/60 (BP Location: Left Arm, Patient Position: Sitting, Cuff Size: Normal)   Pulse 63   Ht 5\' 11"  (1.803 m)   Wt 234 lb (106.1 kg)   BMI 32.64 kg/m   EXAM: General appearance: alert and no distress Neck: no carotid bruit and no JVD Lungs: clear to auscultation bilaterally Heart: regular rate and rhythm, S1, S2 normal and systolic murmur: early systolic 3/6, crescendo at 2nd right intercostal space Abdomen: soft, non-tender; bowel sounds normal; no masses,  no organomegaly Extremities: extremities normal, atraumatic, no cyanosis or edema Pulses: 2+ and symmetric Skin: Skin color, texture, turgor normal. No rashes or lesions Neurologic: Grossly normal  EKG: This rhythm with first-degree AV block, RBBB at 63-personally  reviewed  ASSESSMENT: 1. Acute on chronic diastolic congestive heart failure-secondary to medication noncompliance 2. Mild to moderate aortic stenosis 3. New RBBB/bifascicular block 4. PAD status post right SFA stent -stable dopplers, no claudication 5. Hypertension- controlled 6. Dizziness 7. Dyslipidemia 8. Asymptomatic bradycardia   PLAN: 1.   Mr. Van has had weight gain and worsening lower extremity edema accompanied with shortness of breath.  He stopped his diuretic, he is not taking his medications for prostate enlargement, nor his thyroid medications.  I will go ahead and restart those today.  We will go ahead and check a BNP and I may switch his HCTZ over to Lasix.  We will also check TSH and free T4 as he has been off of his thyroid medication.  He will have his regularly scheduled echocardiogram at the end of May and follow-up appointment as previously scheduled with me the day afterwards.  Pixie Casino, MD, [facility], [place] Director of the Advanced Lipid Disorders &  Cardiovascular Risk Reduction Clinic Diplomate of the American Board of Clinical Lipidology Attending Cardiologist  Direct Dial: [phone]  Fax: [phone]  Website:  [email or web address] 05/11/2017, 8:49 PM

## 2017-05-14 ENCOUNTER — Telehealth: Payer: Self-pay | Admitting: Internal Medicine

## 2017-05-14 DIAGNOSIS — Z79899 Other long term (current) drug therapy: Secondary | ICD-10-CM

## 2017-05-14 MED ORDER — FUROSEMIDE 40 MG PO TABS: 40.0000 mg | ORAL_TABLET | Freq: Every day | ORAL | 5 refills | Status: DC

## 2017-05-14 NOTE — Telephone Encounter (Signed)
Yes .. But her PCP should recheck thyroid function in 2 months.  Dr. Lemmie Evens

## 2017-05-14 NOTE — Telephone Encounter (Signed)
Notes recorded by Fidel Levy, RN on 05/13/2017 at 11:16 AM EDT LMTCB on [phone] [facility]) ------  Notes recorded by Fidel Levy, RN on 05/12/2017 at 8:56 AM EDT LMTCB on [phone] (Home) ------  Notes recorded by Pixie Casino, MD on 05/10/2017 at 4:35 PM EDT Labs stable - thyroid function is normal. BNP is elevated. Start lasix 40 mg daily, instead of HCTZ. Repeat BMET in 1 week.  Dr. Lemmie Evens ______________________________ After 2 unsucessful attempts at contacting patient, I called [name]. Notified her of med change per MD and need for repeat labs in 1 week at [facility] in [place].   She also reports her father had been on synthroid for a few months, maybe since January, and started back on it a few days ago. She asked that since his TSH was normal on these lab should he resume this medication?

## 2017-05-14 NOTE — Telephone Encounter (Signed)
Daughter notified of MD advice/recommendations. She voiced understanding.

## 2017-05-23 DIAGNOSIS — Z79899 Other long term (current) drug therapy: Secondary | ICD-10-CM | POA: Diagnosis not present

## 2017-05-24 LAB — BASIC METABOLIC PANEL
BUN/Creatinine Ratio: 14 (ref 10–24)
BUN: 23 mg/dL (ref 8–27)
CO2: 27 mmol/L (ref 20–29)
Calcium: 9.4 mg/dL (ref 8.6–10.2)
Chloride: 96 mmol/L (ref 96–106)
Creatinine, Ser: 1.61 mg/dL — ABNORMAL HIGH (ref 0.76–1.27)
GFR calc Af Amer: 44 mL/min/{1.73_m2} — ABNORMAL LOW (ref >59)
GFR calc non Af Amer: 38 mL/min/{1.73_m2} — ABNORMAL LOW (ref >59)
Glucose: 146 mg/dL — ABNORMAL HIGH (ref 65–99)
Potassium: 4.2 mmol/L (ref 3.5–5.2)
Sodium: 136 mmol/L (ref 134–144)

## 2017-05-26 DIAGNOSIS — N401 Enlarged prostate with lower urinary tract symptoms: Secondary | ICD-10-CM | POA: Diagnosis not present

## 2017-05-26 DIAGNOSIS — R3914 Feeling of incomplete bladder emptying: Secondary | ICD-10-CM | POA: Diagnosis not present

## 2017-06-02 ENCOUNTER — Telehealth: Payer: Self-pay | Admitting: Internal Medicine

## 2017-06-02 NOTE — Telephone Encounter (Signed)
Returned call to patient's daughter [name] lab results given.She stated edema is better.Advised to keep appointment with Dr.Hilty 06/13/17 at 3:45 pm.

## 2017-06-02 NOTE — Telephone Encounter (Signed)
New message:      Pt's daughter is calling to get test results

## 2017-06-12 ENCOUNTER — Other Ambulatory Visit: Payer: Self-pay

## 2017-06-12 ENCOUNTER — Encounter (INDEPENDENT_AMBULATORY_CARE_PROVIDER_SITE_OTHER): Payer: Self-pay

## 2017-06-12 ENCOUNTER — Ambulatory Visit (HOSPITAL_COMMUNITY): Payer: Medicare Other | Attending: Cardiovascular Disease

## 2017-06-12 DIAGNOSIS — R7303 Prediabetes: Secondary | ICD-10-CM | POA: Diagnosis not present

## 2017-06-12 DIAGNOSIS — I350 Nonrheumatic aortic (valve) stenosis: Secondary | ICD-10-CM | POA: Insufficient documentation

## 2017-06-12 DIAGNOSIS — E785 Hyperlipidemia, unspecified: Secondary | ICD-10-CM | POA: Insufficient documentation

## 2017-06-12 DIAGNOSIS — G4730 Sleep apnea, unspecified: Secondary | ICD-10-CM | POA: Insufficient documentation

## 2017-06-12 DIAGNOSIS — I119 Hypertensive heart disease without heart failure: Secondary | ICD-10-CM | POA: Diagnosis not present

## 2017-06-12 DIAGNOSIS — M1711 Unilateral primary osteoarthritis, right knee: Secondary | ICD-10-CM | POA: Diagnosis not present

## 2017-06-12 DIAGNOSIS — I739 Peripheral vascular disease, unspecified: Secondary | ICD-10-CM | POA: Insufficient documentation

## 2017-06-12 DIAGNOSIS — I371 Nonrheumatic pulmonary valve insufficiency: Secondary | ICD-10-CM | POA: Insufficient documentation

## 2017-06-13 ENCOUNTER — Ambulatory Visit: Payer: Medicare Other | Admitting: Internal Medicine

## 2017-06-13 ENCOUNTER — Encounter: Payer: Self-pay | Admitting: Internal Medicine

## 2017-06-13 VITALS — BP 139/73 | HR 61 | Ht 71.0 in | Wt 235.4 lb

## 2017-06-13 DIAGNOSIS — I350 Nonrheumatic aortic (valve) stenosis: Secondary | ICD-10-CM | POA: Diagnosis not present

## 2017-06-13 DIAGNOSIS — R0602 Shortness of breath: Secondary | ICD-10-CM | POA: Diagnosis not present

## 2017-06-13 DIAGNOSIS — I4510 Unspecified right bundle-branch block: Secondary | ICD-10-CM | POA: Diagnosis not present

## 2017-06-13 DIAGNOSIS — I10 Essential (primary) hypertension: Secondary | ICD-10-CM | POA: Diagnosis not present

## 2017-06-13 NOTE — Patient Instructions (Signed)
Your physician has requested that you have an echocardiogram @ [facility] in 6 months. Echocardiography is a painless test that uses sound waves to create images of your heart. It provides your doctor with information about the size and shape of your heart and how well your heart's chambers and valves are working. This procedure takes approximately one hour. There are no restrictions for this procedure.  Your physician wants you to follow-up in: 6 months with Dr. Debara Pickett. You will receive a reminder letter in the mail two months in advance. If you don't receive a letter, please call our office to schedule the follow-up appointment.

## 2017-06-13 NOTE — Progress Notes (Signed)
OFFICE NOTE  Chief Complaint:  Follow-up echo, lab work  Primary Care Physician: [name], MD  HPI:  [name]  is an 84 year old gentleman with history of hypertension, dyslipidemia and peripheral vascular disease. He did have claudication and in May 2012 he had high-grade mid SFA stenosis. He had a stent placed in his SFA which improved his claudication and his Doppler studies. In December 2012 his left ABI was 0.61, the right ABI 0.74 with a patent stent. He was seen back by Dr. Gwenlyn Found and recommended followup with me in the office today. His only complaints now are about occasional dizziness especially with change in position, also leg pain with walking but not claudication, mostly joint pain, hip pain which could represent arthritis, wearing glasses.   I the pleasure seeing [name] back today. It's been over a year and a half since I saw him last in the office. He reports doing fairly well and denies any chest pain or worsening shortness of breath. He denies any claudication in his legs. Blood pressure is noted to be elevated somewhat today. He is previous film lisinopril and this was stopped for unknown reasons. Not on aspirin but continues to take Plavix for his PAD.  [name] returns today for follow-up. Overall he is doing fairly well though has complaints about particular right hip pain and knee pain. He also reports some swelling in his legs gets worse during the day but improves with elevating them overnight. He says he gets some numbness and tingling in his feet as well. He had arterial Dopplers last year which show stable ABIs and no evidence of stent occlusion. He is due for repeat arterial Dopplers in April of this year. He denies any chest pain or worsening shortness of breath.  06/12/2016  [name] returns today for follow-up. Recently he saw his nephrologist to auscultated a systolic heart murmur. An echocardiogram was ordered at [facility]. This  demonstrated an EF of 60-65%, there was mild to moderate aortic stenosis with a mean gradient of 15 mmHg and calculated valve area of 1.3-1.4 cm. He denies chest pain, worsening shortness of breath, presyncope, syncope or congestive heart failure symptoms. His only other complaint today is bilateral knee pain. He has a history of knee surgery and has also been having some right hip pain. Blood pressure is at goal. He is on atorvastatin 40 mg daily followed by his primary care provider. He has a history of PAD with lower extremity arterial Dopplers in April 2017 that showed no obstructive PAD and a patent stent.  05/09/2017  [name] returns today for follow-up.  He is reporting some mild shortness of breath as well as lower extremity edema.  According to records he is gained 6 pounds since we last saw him.  He reports worsening lower extremity swelling.  He was scheduled to have a routine echocardiogram on May 30 and office visit with me on May 31, however came in earlier because of worsening shortness of breath.  EKG just shows sinus rhythm with first-degree AV block at 63, RBBB-personally reviewed.  His daughter was accompanying him today and noted that he has been out of several of his medications.  Namely he has not taken tamsulosin, finasteride and levothyroxine.  He is also out of his hydrochlorothiazide diuretic.  06/13/2017  [name] returns today for follow-up.  He had an echocardiogram yesterday which showed LVEF of 65-705%, however he now has moderate aortic stenosis, increased from mild  stenosis a year ago.  He reports marked improvement in his edema although weight is about a pound heavier than it was when I last saw him a month ago.  He is on Lasix now.  Creatinine is slightly higher on the Lasix at 1.6, but may be accepted due to his improvement in edema.  PMHx:  Past Medical History:  Diagnosis Date  . Arthritis   . Borderline diabetic   . Glaucoma   . Hyperlipidemia   . Hypertension    . PVD (peripheral vascular disease) ([place])   . Sleep apnea    Cpap ordered but doesnt use    Past Surgical History:  Procedure Laterality Date  . APPENDECTOMY  1943  . CATARACT EXTRACTION  2012   x2  . COLONOSCOPY N/A 11/01/2014   Procedure: COLONOSCOPY;  Surgeon: Aviva Signs, MD;  [facility];  Service: Gastroenterology;  Laterality: N/A;  . ESOPHAGOGASTRODUODENOSCOPY N/A 11/01/2014   Procedure: ESOPHAGOGASTRODUODENOSCOPY (EGD);  Surgeon: Aviva Signs, MD;  [facility];  Service: Gastroenterology;  Laterality: N/A;  . HERNIA REPAIR Left   . INGUINAL HERNIA REPAIR Right 03/01/2013   Procedure: RIGHT INGUINAL HERNIORRHAPHY;  Surgeon: Jamesetta So, MD;  Location: [facility];  Service: General;  Laterality: Right;  . INSERTION OF MESH Right 03/01/2013   Procedure: INSERTION OF MESH;  Surgeon: Jamesetta So, MD;  Location: [facility];  Service: General;  Laterality: Right;  . NM [place]  06/05/2010   no significant ischemia  . stent  06/14/2010   left leg  . US ECHOCARDIOGRAPHY  01/21/2006   moderate mitral annular ca+, mild MR, AOV moderately sclerotic.    FAMHx:  Family History  Problem Relation Age of Onset  . Cancer Mother   . Heart attack Father   . Stroke Father   . Heart attack Brother   . Heart attack Brother     SOCHx:   reports that he quit smoking about 82 years ago. He has quit using smokeless tobacco. His smokeless tobacco use included chew. He reports that he does not drink alcohol or use drugs.  ALLERGIES:  Allergies  Allergen Reactions  . Iodine-131 Other (See Comments)    Breakout, fever  . Iohexol      Code: RASH, Desc: PT STATES HOT ALL OVER HAD TO HAVE ICE PACKS ALL OVER HIM AND DIFF BREATHING, PT REFUSED IV DYE     ROS: Pertinent items noted in HPI and remainder of comprehensive ROS otherwise negative.  HOME MEDS: Current Outpatient Medications  Medication Sig Dispense Refill  . atorvastatin (LIPITOR) 40 MG  tablet take 1 tablet by mouth once daily 90 tablet 3  . calcium carbonate (TUMS - DOSED IN MG ELEMENTAL CALCIUM) 500 MG chewable tablet Chew 1 tablet by mouth as needed for indigestion or heartburn.    . cholecalciferol (VITAMIN D) 1000 UNITS tablet Take 1,000 Units by mouth daily.    . clopidogrel (PLAVIX) 75 MG tablet take 1 tablet by mouth once daily 90 tablet 3  . dorzolamide-timolol (COSOPT) 22.3-6.8 MG/ML ophthalmic solution Place 1 drop into both eyes 2 (two) times daily.    . finasteride (PROSCAR) 5 MG tablet Take 5 mg by mouth daily.    . furosemide (LASIX) 40 MG tablet Take 1 tablet (40 mg total) by mouth daily. 30 tablet 5  . glipiZIDE (GLUCOTROL) 5 MG tablet Take 5 mg by mouth daily before breakfast.     . glucosamine-chondroitin 500-400 MG tablet Take  3 tablets by mouth 2 (two) times daily.     [name] levothyroxine (SYNTHROID, LEVOTHROID) 50 MCG tablet Take 1 tablet by mouth daily.  0  . lisinopril (PRINIVIL,ZESTRIL) 5 MG tablet Take 1 tablet (5 mg total) by mouth daily. 30 tablet 5  . Methylcellulose, Laxative, (FIBER THERAPY PO) Take by mouth daily.    [name] pyridOXINE (VITAMIN B-6) 100 MG tablet Take 100 mg by mouth daily.    . tamsulosin (FLOMAX) 0.4 MG CAPS Take 0.4 mg by mouth 2 (two) times daily.     . vitamin C (ASCORBIC ACID) 500 MG tablet Take 500 mg by mouth daily.    . vitamin E 400 UNIT capsule Take 400 Units by mouth daily.    [name] zinc gluconate 50 MG tablet Take 50 mg by mouth daily.     No current facility-administered medications for this visit.     LABS/IMAGING: No results found for this or any previous visit (from the past 48 hour(s)). No results found.  VITALS: BP 139/73   Pulse 61   Ht 5\' 11"  (1.803 m)   Wt 235 lb 6.4 oz (106.8 kg)   BMI 32.83 kg/m   EXAM: General appearance: alert and no distress Neck: no carotid bruit and no JVD Lungs: clear to auscultation bilaterally Heart: regular rate and rhythm, S1, S2 normal and systolic murmur: early systolic 3/6,  crescendo at 2nd right intercostal space Abdomen: soft, non-tender; bowel sounds normal; no masses,  no organomegaly Extremities: extremities normal, atraumatic, no cyanosis or edema Pulses: 2+ and symmetric Skin: Skin color, texture, turgor normal. No rashes or lesions Neurologic: Grossly normal  EKG: This rhythm with first-degree AV block, RBBB at 63-personally reviewed  ASSESSMENT: 1. Acute on chronic diastolic congestive heart failure-secondary to medication noncompliance 2. Moderate aortic stenosis 3. New RBBB/bifascicular block 4. PAD status post right SFA stent -stable dopplers, no claudication 5. Hypertension- controlled 6. Dizziness 7. Dyslipidemia 8. Asymptomatic bradycardia   PLAN: 1.   Mr. Stofko has had good diuresis on Lasix although at the expense of a little higher creatinine.  He should remain on his current dose of Lasix.  Echo shows normal systolic function but worsening (now moderate) aortic stenosis.  He will need a repeat study in 6 months.  Will need to follow it closely.  He is not likely to be a candidate for traditional AVR but could be a candidate for TAVR.  Follow-up in 6 months.  Pixie Casino, MD, [facility], [place] Director of the Advanced Lipid Disorders &  Cardiovascular Risk Reduction Clinic Diplomate of the American Board of Clinical Lipidology Attending Cardiologist  Direct Dial: [phone]  Fax: [phone]  Website:  [email or web address] 06/13/2017, 4:05 PM

## 2017-06-30 ENCOUNTER — Telehealth: Payer: Self-pay | Admitting: Internal Medicine

## 2017-06-30 ENCOUNTER — Other Ambulatory Visit: Payer: Self-pay

## 2017-06-30 NOTE — Telephone Encounter (Signed)
Attempted to contact Anna with [facility] - no answer

## 2017-06-30 NOTE — Patient Outreach (Signed)
[facility]) Care Management  06/30/2017  KEVAL NAM Mar 29, 1927 [id number]   Medication Adherence call to Mr. Rocko Fesperman spoke with patient he said he is only taking 1/2 tablet per doctor Lyman Bishop new instruction. left a message at doctor's office to clarify if patient is to be taking 1/2 of tablet or 1 tablet and  If it has change if they can send in a new prescription staying new instruction.Mr. Finbar Nippert is showing past due under [place].   [place] Management Direct Dial [phone]  Fax [phone] [email or web address]

## 2017-06-30 NOTE — Telephone Encounter (Signed)
New Message:    Please call,concerning his Lisinopril.

## 2017-07-01 NOTE — Telephone Encounter (Signed)
Unable to reach [place] or leave a message

## 2017-07-03 MED ORDER — LISINOPRIL 5 MG PO TABS: 5.0000 mg | ORAL_TABLET | Freq: Every day | ORAL | 3 refills | Status: DC

## 2017-07-03 NOTE — Telephone Encounter (Signed)
RN REVIEWED  PATIENT CHL CHART.  MESSAGE FROM [facility] had question concerning  Medication doses- lisinopril.   per last office visit 05/09/17, Decrease to Lisinopril 5 mg daily.   RN unable to reach [place]. Called patient- patient states he is taking 5 mg lisinopril. Patient went and got the bottle and read the instructions to the RN.  PATIENT STATES HE AD ANOTHER BOTTLE OF 10 MG BUT WAS TAKING 1/2 TABLET. RN INFORMED PATIENT TO DISCARD THE 10 MG TABLETS. ONLY I=USE  5 MG TABLETS.   PATIENT REQUEST #[id number] SUPPLY  OF LISINOPRIL TO BE SENT TO PHARMACY.  RN INFORMED PATIENT IT IS DONE AND HE WILL HAVE TO LET  PHARMACY KNOW WHEN HE WAS READY FOR PICK UP  VERBALIZED UNDERSTANDING

## 2017-07-09 ENCOUNTER — Other Ambulatory Visit: Payer: Self-pay | Admitting: *Deleted

## 2017-07-09 DIAGNOSIS — I350 Nonrheumatic aortic (valve) stenosis: Secondary | ICD-10-CM

## 2017-07-25 ENCOUNTER — Emergency Department (HOSPITAL_COMMUNITY): Payer: Medicare Other

## 2017-07-25 ENCOUNTER — Other Ambulatory Visit: Payer: Self-pay

## 2017-07-25 ENCOUNTER — Encounter (HOSPITAL_COMMUNITY): Payer: Self-pay | Admitting: Emergency Medicine

## 2017-07-25 ENCOUNTER — Observation Stay (HOSPITAL_COMMUNITY)
Admission: EM | Admit: 2017-07-25 | Discharge: 2017-07-26 | Disposition: A | Discharge status: Home / Self Care | Payer: Medicare Other | Attending: Internal Medicine | Admitting: Internal Medicine

## 2017-07-25 DIAGNOSIS — E782 Mixed hyperlipidemia: Secondary | ICD-10-CM | POA: Insufficient documentation

## 2017-07-25 DIAGNOSIS — I4891 Unspecified atrial fibrillation: Secondary | ICD-10-CM | POA: Diagnosis not present

## 2017-07-25 DIAGNOSIS — Z87891 Personal history of nicotine dependence: Secondary | ICD-10-CM | POA: Insufficient documentation

## 2017-07-25 DIAGNOSIS — E1122 Type 2 diabetes mellitus with diabetic chronic kidney disease: Secondary | ICD-10-CM | POA: Insufficient documentation

## 2017-07-25 DIAGNOSIS — I10 Essential (primary) hypertension: Secondary | ICD-10-CM

## 2017-07-25 DIAGNOSIS — Z79899 Other long term (current) drug therapy: Secondary | ICD-10-CM | POA: Insufficient documentation

## 2017-07-25 DIAGNOSIS — E039 Hypothyroidism, unspecified: Secondary | ICD-10-CM | POA: Diagnosis not present

## 2017-07-25 DIAGNOSIS — N183 Chronic kidney disease, stage 3 (moderate): Secondary | ICD-10-CM | POA: Diagnosis not present

## 2017-07-25 DIAGNOSIS — R0789 Other chest pain: Secondary | ICD-10-CM

## 2017-07-25 DIAGNOSIS — I129 Hypertensive chronic kidney disease with stage 1 through stage 4 chronic kidney disease, or unspecified chronic kidney disease: Principal | ICD-10-CM | POA: Insufficient documentation

## 2017-07-25 DIAGNOSIS — I480 Paroxysmal atrial fibrillation: Secondary | ICD-10-CM | POA: Diagnosis not present

## 2017-07-25 DIAGNOSIS — N400 Enlarged prostate without lower urinary tract symptoms: Secondary | ICD-10-CM | POA: Diagnosis present

## 2017-07-25 DIAGNOSIS — I482 Chronic atrial fibrillation, unspecified: Secondary | ICD-10-CM

## 2017-07-25 DIAGNOSIS — Z7902 Long term (current) use of antithrombotics/antiplatelets: Secondary | ICD-10-CM | POA: Insufficient documentation

## 2017-07-25 DIAGNOSIS — R0689 Other abnormalities of breathing: Secondary | ICD-10-CM | POA: Diagnosis not present

## 2017-07-25 DIAGNOSIS — I110 Hypertensive heart disease with heart failure: Secondary | ICD-10-CM | POA: Insufficient documentation

## 2017-07-25 DIAGNOSIS — R079 Chest pain, unspecified: Secondary | ICD-10-CM | POA: Diagnosis not present

## 2017-07-25 DIAGNOSIS — Z7984 Long term (current) use of oral hypoglycemic drugs: Secondary | ICD-10-CM | POA: Insufficient documentation

## 2017-07-25 DIAGNOSIS — E785 Hyperlipidemia, unspecified: Secondary | ICD-10-CM | POA: Diagnosis present

## 2017-07-25 DIAGNOSIS — I4510 Unspecified right bundle-branch block: Secondary | ICD-10-CM | POA: Diagnosis not present

## 2017-07-25 DIAGNOSIS — I5032 Chronic diastolic (congestive) heart failure: Secondary | ICD-10-CM

## 2017-07-25 DIAGNOSIS — R011 Cardiac murmur, unspecified: Secondary | ICD-10-CM | POA: Diagnosis not present

## 2017-07-25 DIAGNOSIS — R0602 Shortness of breath: Secondary | ICD-10-CM | POA: Diagnosis not present

## 2017-07-25 LAB — BASIC METABOLIC PANEL
Anion gap: 9 (ref 5–15)
BUN: 17 mg/dL (ref 8–23)
CO2: 28 mmol/L (ref 22–32)
Calcium: 9.3 mg/dL (ref 8.9–10.3)
Chloride: 97 mmol/L — ABNORMAL LOW (ref 98–111)
Creatinine, Ser: 1.46 mg/dL — ABNORMAL HIGH (ref 0.61–1.24)
GFR calc Af Amer: 48 mL/min — ABNORMAL LOW (ref >60)
GFR calc non Af Amer: 41 mL/min — ABNORMAL LOW (ref >60)
Glucose, Bld: 190 mg/dL — ABNORMAL HIGH (ref 70–99)
Potassium: 3.9 mmol/L (ref 3.5–5.1)
Sodium: 134 mmol/L — ABNORMAL LOW (ref 135–145)

## 2017-07-25 LAB — GLUCOSE, CAPILLARY: Glucose-Capillary: 175 mg/dL — ABNORMAL HIGH (ref 70–99)

## 2017-07-25 LAB — TSH: TSH: 2.549 u[IU]/mL (ref 0.350–4.500)

## 2017-07-25 LAB — CBC
HCT: 39.1 % (ref 39.0–52.0)
Hemoglobin: 13.2 g/dL (ref 13.0–17.0)
MCH: 33.8 pg (ref 26.0–34.0)
MCHC: 33.8 g/dL (ref 30.0–36.0)
MCV: 100 fL (ref 78.0–100.0)
Platelets: 202 10*3/uL (ref 150–400)
RBC: 3.91 MIL/uL — ABNORMAL LOW (ref 4.22–5.81)
RDW: 13.1 % (ref 11.5–15.5)
WBC: 7.4 10*3/uL (ref 4.0–10.5)

## 2017-07-25 LAB — HEMOGLOBIN A1C
Hgb A1c MFr Bld: 8.1 % — ABNORMAL HIGH (ref 4.8–5.6)
Mean Plasma Glucose: 185.77 mg/dL

## 2017-07-25 LAB — I-STAT TROPONIN, ED: Troponin i, poc: 0.01 ng/mL (ref 0.00–0.08)

## 2017-07-25 LAB — TROPONIN I
Troponin I: <0.03 ng/mL (ref <0.03)
Troponin I: 0.11 ng/mL (ref <0.03)

## 2017-07-25 LAB — MAGNESIUM: Magnesium: 2 mg/dL (ref 1.7–2.4)

## 2017-07-25 MED ORDER — CALCIUM CARBONATE ANTACID 500 MG PO CHEW
1.0000 | CHEWABLE_TABLET | ORAL | Status: DC | PRN
Start: 2017-07-25 — End: 2017-07-26

## 2017-07-25 MED ORDER — LISINOPRIL 5 MG PO TABS
5.0000 mg | ORAL_TABLET | Freq: Every day | ORAL | Status: DC
  Administered 2017-07-25 – 2017-07-26 (×2): 5 mg via ORAL
  Filled 2017-07-25 (×2): qty 1

## 2017-07-25 MED ORDER — ACETAMINOPHEN 325 MG PO TABS: 650.0000 mg | ORAL_TABLET | ORAL | Status: DC | PRN

## 2017-07-25 MED ORDER — LEVOTHYROXINE SODIUM 50 MCG PO TABS
50.0000 ug | ORAL_TABLET | Freq: Every day | ORAL | Status: DC
  Administered 2017-07-26: 50 ug via ORAL
  Filled 2017-07-25: qty 1

## 2017-07-25 MED ORDER — VITAMIN C 500 MG PO TABS
500.0000 mg | ORAL_TABLET | Freq: Every day | ORAL | Status: DC
  Administered 2017-07-25 – 2017-07-26 (×2): 500 mg via ORAL
  Filled 2017-07-25 (×2): qty 1

## 2017-07-25 MED ORDER — ZINC GLUCONATE 50 MG PO TABS
50.0000 mg | ORAL_TABLET | Freq: Every day | ORAL | Status: DC
  Filled 2017-07-25 (×3): qty 1

## 2017-07-25 MED ORDER — FAMOTIDINE 20 MG PO TABS
20.0000 mg | ORAL_TABLET | Freq: Every day | ORAL | Status: DC
  Administered 2017-07-25: 20 mg via ORAL
  Filled 2017-07-25: qty 1

## 2017-07-25 MED ORDER — VITAMIN B-6 50 MG PO TABS
100.0000 mg | ORAL_TABLET | Freq: Every day | ORAL | Status: DC
  Administered 2017-07-25 – 2017-07-26 (×2): 100 mg via ORAL
  Filled 2017-07-25 (×2): qty 2

## 2017-07-25 MED ORDER — APIXABAN 5 MG PO TABS
5.0000 mg | ORAL_TABLET | Freq: Two times a day (BID) | ORAL | Status: DC
  Administered 2017-07-25 – 2017-07-26 (×3): 5 mg via ORAL
  Filled 2017-07-25 (×3): qty 1

## 2017-07-25 MED ORDER — METOPROLOL TARTRATE 5 MG/5ML IV SOLN
5.0000 mg | Freq: Once | INTRAVENOUS | Status: DC
  Filled 2017-07-25: qty 5

## 2017-07-25 MED ORDER — ONDANSETRON HCL 4 MG/2ML IJ SOLN: 4.0000 mg | Freq: Four times a day (QID) | INTRAMUSCULAR | Status: DC | PRN

## 2017-07-25 MED ORDER — OMEGA-3-ACID ETHYL ESTERS 1 G PO CAPS
1.0000 g | ORAL_CAPSULE | Freq: Every day | ORAL | Status: DC
  Administered 2017-07-25 – 2017-07-26 (×2): 1 g via ORAL
  Filled 2017-07-25 (×2): qty 1

## 2017-07-25 MED ORDER — DORZOLAMIDE HCL-TIMOLOL MAL 2-0.5 % OP SOLN
1.0000 [drp] | Freq: Two times a day (BID) | OPHTHALMIC | Status: DC
  Administered 2017-07-25 – 2017-07-26 (×2): 1 [drp] via OPHTHALMIC
  Filled 2017-07-25: qty 10

## 2017-07-25 MED ORDER — FINASTERIDE 5 MG PO TABS
5.0000 mg | ORAL_TABLET | Freq: Every day | ORAL | Status: DC
  Administered 2017-07-26: 5 mg via ORAL
  Filled 2017-07-25 (×4): qty 1

## 2017-07-25 MED ORDER — NITROGLYCERIN 2 % TD OINT
1.0000 [in_us] | TOPICAL_OINTMENT | Freq: Once | TRANSDERMAL | Status: AC
  Administered 2017-07-25: 1 [in_us] via TOPICAL
  Filled 2017-07-25: qty 1

## 2017-07-25 MED ORDER — TAMSULOSIN HCL 0.4 MG PO CAPS
0.4000 mg | ORAL_CAPSULE | Freq: Two times a day (BID) | ORAL | Status: DC
  Administered 2017-07-25 – 2017-07-26 (×2): 0.4 mg via ORAL
  Filled 2017-07-25 (×2): qty 1

## 2017-07-25 MED ORDER — INSULIN ASPART 100 UNIT/ML ~~LOC~~ SOLN
0.0000 [IU] | Freq: Three times a day (TID) | SUBCUTANEOUS | Status: DC
  Administered 2017-07-26 (×2): 2 [IU] via SUBCUTANEOUS

## 2017-07-25 MED ORDER — MORPHINE SULFATE (PF) 2 MG/ML IV SOLN: 1.0000 mg | INTRAVENOUS | Status: DC | PRN

## 2017-07-25 MED ORDER — ATORVASTATIN CALCIUM 40 MG PO TABS
40.0000 mg | ORAL_TABLET | Freq: Every day | ORAL | Status: DC
  Administered 2017-07-25: 40 mg via ORAL
  Filled 2017-07-25: qty 1

## 2017-07-25 MED ORDER — VITAMIN D 1000 UNITS PO TABS
1000.0000 [IU] | ORAL_TABLET | Freq: Every day | ORAL | Status: DC
  Administered 2017-07-25 – 2017-07-26 (×2): 1000 [IU] via ORAL
  Filled 2017-07-25 (×3): qty 1

## 2017-07-25 MED ORDER — FUROSEMIDE 40 MG PO TABS
40.0000 mg | ORAL_TABLET | Freq: Every day | ORAL | Status: DC
  Administered 2017-07-25 – 2017-07-26 (×2): 40 mg via ORAL
  Filled 2017-07-25 (×2): qty 1

## 2017-07-25 MED ORDER — VITAMIN E 180 MG (400 UNIT) PO CAPS
400.0000 [IU] | ORAL_CAPSULE | Freq: Every day | ORAL | Status: DC
  Administered 2017-07-26: 400 [IU] via ORAL
  Filled 2017-07-25: qty 1

## 2017-07-25 MED ORDER — ASPIRIN 81 MG PO CHEW
324.0000 mg | CHEWABLE_TABLET | Freq: Once | ORAL | Status: AC
  Administered 2017-07-25: 324 mg via ORAL
  Filled 2017-07-25: qty 4

## 2017-07-25 NOTE — ED Triage Notes (Signed)
Pt reports waking up this morning feeling like he had indigestion. Went to the gym and continued to feel bad. Pt reports substernal, non-radiating chest pain w/o SOB, dizziness, or nausea. Pt given 1 Nitro by EMS PTA with mild relief.

## 2017-07-25 NOTE — ED Provider Notes (Signed)
[facility] Provider Note   CSN: [id number] Arrival date & time: 07/25/17  0945     History   Chief Complaint Chief Complaint  Patient presents with  . Chest Pain    HPI [name] is a 84 y.o. male.  HPI  84 year old male, history of high blood pressure, dyslipidemia, peripheral vascular disease.  He had claudication in 2012, stent was placed at that time and his superficial femoral artery which improved his symptoms.  He is not known to have coronary obstructive disease and an echocardiogram has been done in the past with an ejection fraction of 60 to 65%.  He is known to have mild to moderate aortic stenosis.  He last saw his cardiologist approximately 1-1/2 months ago during which time he had clinical progression of his aortic stenosis and a repeat echocardiogram was to be performed around November 2019.  He presents today with chest discomfort which she woke up with this morning he states "indigestion" was present on awakening, has been persistent, seem to get worse when he was at the gym exerting himself.  Associated with a tightness in the chest, associated shortness of breath but no nausea vomiting diaphoresis fevers or swelling of the legs.  No medications given prior to arrival, he does not take aspirin according to his report however he is on Plavix,, lisinopril, statin and Lasix.  He is a diabetic.  Past Medical History:  Diagnosis Date  . Arthritis   . Borderline diabetic   . Glaucoma   . Hyperlipidemia   . Hypertension   . PVD (peripheral vascular disease) ([place])   . Sleep apnea    Cpap ordered but doesnt use    Patient Active Problem List   Diagnosis Date Noted  . Chest pain 07/25/2017  . Shortness of breath 05/11/2017  . Hypothyroidism 05/11/2017  . Acute on chronic diastolic (congestive) heart failure ([place]) 05/11/2017  . Aortic valve stenosis 06/12/2016  . RBBB 06/12/2016  . PAD (peripheral artery disease) ([place]) 08/04/2012  .  Essential hypertension 08/04/2012  . Mixed hyperlipidemia 08/04/2012  . Dizziness 08/04/2012    Past Surgical History:  Procedure Laterality Date  . APPENDECTOMY  1943  . CATARACT EXTRACTION  2012   x2  . COLONOSCOPY N/A 11/01/2014   Procedure: COLONOSCOPY;  Surgeon: Aviva Signs, MD;  [facility];  Service: Gastroenterology;  Laterality: N/A;  . ESOPHAGOGASTRODUODENOSCOPY N/A 11/01/2014   Procedure: ESOPHAGOGASTRODUODENOSCOPY (EGD);  Surgeon: Aviva Signs, MD;  Location: [facility];  Service: Gastroenterology;  Laterality: N/A;  . HERNIA REPAIR Left   . INGUINAL HERNIA REPAIR Right 03/01/2013   Procedure: RIGHT INGUINAL HERNIORRHAPHY;  Surgeon: Jamesetta So, MD;  Location: [facility];  Service: General;  Laterality: Right;  . INSERTION OF MESH Right 03/01/2013   Procedure: INSERTION OF MESH;  Surgeon: Jamesetta So, MD;  Location: [facility];  Service: General;  Laterality: Right;  . NM [place]  06/05/2010   no significant ischemia  . stent  06/14/2010   left leg  . US ECHOCARDIOGRAPHY  01/21/2006   moderate mitral annular ca+, mild MR, AOV moderately sclerotic.        Home Medications    Prior to Admission medications   Medication Sig Start Date End Date Taking? Authorizing Provider  atorvastatin (LIPITOR) 40 MG tablet take 1 tablet by mouth once daily 11/01/16  Yes Hilty, Nadean Corwin, MD  calcium carbonate (TUMS - DOSED IN MG ELEMENTAL CALCIUM) 500 MG chewable  tablet Chew 1 tablet by mouth as needed for indigestion or heartburn.   Yes [provider]  cholecalciferol (VITAMIN D) 1000 UNITS tablet Take 1,000 Units by mouth daily.   Yes [provider]  clopidogrel (PLAVIX) 75 MG tablet take 1 tablet by mouth once daily 11/01/16  Yes Hilty, Nadean Corwin, MD  dorzolamide-timolol (COSOPT) 22.3-6.8 MG/ML ophthalmic solution Place 1 drop into both eyes 2 (two) times daily.   Yes [provider]  finasteride (PROSCAR) 5 MG tablet Take 5 mg  by mouth daily.   Yes [provider]  furosemide (LASIX) 40 MG tablet Take 1 tablet (40 mg total) by mouth daily. 05/14/17 08/12/17 Yes Hilty, Nadean Corwin, MD  glipiZIDE (GLUCOTROL) 5 MG tablet Take 5 mg by mouth 2 (two) times daily.    Yes [provider]  glucosamine-chondroitin 500-400 MG tablet Take 3 tablets by mouth 2 (two) times daily.    Yes [provider]  hydrochlorothiazide (MICROZIDE) 12.5 MG capsule Take 12.5 mg by mouth daily.   Yes [provider]  levothyroxine (SYNTHROID, LEVOTHROID) 50 MCG tablet Take 1 tablet by mouth daily. 07/08/16  Yes [provider]  lisinopril (PRINIVIL,ZESTRIL) 5 MG tablet Take 1 tablet (5 mg total) by mouth daily. 07/03/17  Yes Hilty, Nadean Corwin, MD  Methylcellulose, Laxative, (FIBER THERAPY PO) Take by mouth daily.   Yes [provider]  Omega-3 Fatty Acids (FISH OIL PO) Take 1 capsule by mouth daily.   Yes [provider]  pyridOXINE (VITAMIN B-6) 100 MG tablet Take 100 mg by mouth daily.   Yes [provider]  ranitidine (ZANTAC) 150 MG tablet Take 150 mg by mouth at bedtime.   Yes [provider]  tamsulosin (FLOMAX) 0.4 MG CAPS Take 0.4 mg by mouth 2 (two) times daily.    Yes [provider]  vitamin C (ASCORBIC ACID) 500 MG tablet Take 500 mg by mouth daily.   Yes [provider]  vitamin E 400 UNIT capsule Take 400 Units by mouth daily.   Yes [provider]  zinc gluconate 50 MG tablet Take 50 mg by mouth daily.   Yes [provider]    Family History Family History  Problem Relation Age of Onset  . Cancer Mother   . Heart attack Father   . Stroke Father   . Heart attack Brother   . Heart attack Brother     Social History Social History   Tobacco Use  . Smoking status: Former Smoker    Last attempt to quit: 01/14/1935    Years since quitting: 82.5  . Smokeless tobacco: Former Systems developer    Types: Chew  Substance Use Topics  .  Alcohol use: No  . Drug use: No     Allergies   Iodine-131 and Iohexol   Review of Systems Review of Systems  All other systems reviewed and are negative.    Physical Exam Updated Vital Signs BP 128/70 (BP Location: Right Arm)   Pulse 62   Temp 97.7 F (36.5 C) (Oral)   Resp 14   Ht 5\' 11"  (1.803 m)   Wt 106.6 kg (235 lb)   SpO2 98%   BMI 32.78 kg/m   Physical Exam  Constitutional: He appears well-developed and well-nourished. No distress.  HENT:  Head: Normocephalic and atraumatic.  Mouth/Throat: Oropharynx is clear and moist. No oropharyngeal exudate.  Eyes: Pupils are equal, round, and reactive to light. Conjunctivae and EOM are normal. Right eye exhibits  no discharge. Left eye exhibits no discharge. No scleral icterus.  Neck: Normal range of motion. Neck supple. No JVD present. No thyromegaly present.  Cardiovascular: Intact distal pulses. Exam reveals no gallop and no friction rub.  Murmur ( Systolic murmur auscultated) heard. The patient has a heart rate around 100-120, he is in atrial fibrillation, pulses are normal, no JVD, no peripheral edema  Pulmonary/Chest: Effort normal and breath sounds normal. No respiratory distress. He has no wheezes. He has no rales.  Abdominal: Soft. Bowel sounds are normal. He exhibits no distension and no mass. There is no tenderness.  Musculoskeletal: Normal range of motion. He exhibits no edema or tenderness.  Lymphadenopathy:    He has no cervical adenopathy.  Neurological: He is alert. Coordination normal.  Skin: Skin is warm and dry. No rash noted. No erythema.  Psychiatric: He has a normal mood and affect. His behavior is normal.  Nursing note and vitals reviewed.    ED Treatments / Results  Labs (all labs ordered are listed, but only abnormal results are displayed) Labs Reviewed  BASIC METABOLIC PANEL - Abnormal; Notable for the following components:      Result Value   Sodium 134 (*)    Chloride 97 (*)    Glucose,  Bld 190 (*)    Creatinine, Ser 1.46 (*)    GFR calc non Af Amer 41 (*)    GFR calc Af Amer 48 (*)    All other components within normal limits  CBC - Abnormal; Notable for the following components:   RBC 3.91 (*)    All other components within normal limits  TROPONIN I  TSH  MAGNESIUM  I-STAT TROPONIN, ED    EKG EKG Interpretation  Date/Time:  Friday July 25 2017 12:24:10 EDT Ventricular Rate:  53 PR Interval:    QRS Duration: 147 QT Interval:  454 QTC Calculation: 427 R Axis:   91 Text Interpretation:  Sinus rhythm Prolonged PR interval RBBB and LPFB Since last tracing afib has resolved Confirmed by Noemi Chapel [phone]) on 07/25/2017 12:49:22 PM   Radiology Dg Chest 2 View  Result Date: 07/25/2017 CLINICAL DATA:  Chest pain EXAM: CHEST - 2 VIEW COMPARISON:  01/31/2017 FINDINGS: The heart size and mediastinal contours are within normal limits. Both lungs are clear. The visualized skeletal structures are unremarkable. IMPRESSION: No active cardiopulmonary disease. Electronically Signed   By: Kathreen Devoid   On: 07/25/2017 11:58    Procedures Procedures (including critical care time)  Medications Ordered in ED Medications  metoprolol tartrate (LOPRESSOR) injection 5 mg (5 mg Intravenous Not Given 07/25/17 1232)  apixaban (ELIQUIS) tablet 5 mg (5 mg Oral Given 07/25/17 1427)  aspirin chewable tablet 324 mg (324 mg Oral Given 07/25/17 1033)  nitroGLYCERIN (NITROGLYN) 2 % ointment 1 inch (1 inch Topical Given 07/25/17 1033)     Initial Impression / Assessment and Plan / ED Course  I have reviewed the triage vital signs and the nursing notes.  Pertinent labs & imaging results that were available during my care of the patient were reviewed by me and considered in my medical decision making (see chart for details).  Clinical Course as of Jul 25 1541  Fri Jul 25, 2017  1148 Dr. Harl Bowie will see the patient in consultation.  Recommends admission to a telemetry bed. Lopressor  given.   [BM]    Clinical Course User Index [BM] Noemi Chapel, MD    EKG shows atrial fibrillation or atrial flutter with variable block,  he is intermittently tachycardic, this is part of his new process.  It also shows a right bundle branch block which is also new.  Given his increasing chest pain this is definitely concerning for the presence of an acute coronary syndrome.  Labs will be ordered, nitroglycerin and aspirin will be given, the patient will likely need to be admitted to the hospital.   Atrial fibrillation resolved prior to Lopressor administration.  Hospitalist will admit  Final Clinical Impressions(s) / ED Diagnoses   Final diagnoses:  Chest heaviness  Atrial fibrillation with rapid ventricular response (HCC)      Noemi Chapel, MD 07/25/17 1544

## 2017-07-25 NOTE — Consult Note (Signed)
Cardiology Consultation:   Patient ID: [name]; [id number]; 1927-05-23   Admit date: 07/25/2017 Date of Consult: 07/25/2017  Primary Care Provider: [name], MD Primary Cardiologist: Dr Debara Pickett Primary Electrophysiologist:  na   Patient Profile:   [name] is a 84 y.o. male with a hx of HTN, hyperlipidemia, aortic stenosis who is being seen today for the evaluation of chest pain at the request of Dr Sabra Heck.  History of Present Illness:   [name] 84 yo male moderate aortic stenosis, HTN, hyperlipidemia, PAD, OSA, presents with chest pain  Reports tightness midchest/epigastric upon waking up this AM. 3/10 in severity, no other associated symptoms. Spent about 1 hour at home getting ready to go to gym, pain constant over that time. Got to gym with ongoing symptoms, some weakness. Gym owner spoke with him and called EMS. Currently pain free. From beginning to end approx 2 to 2.5 hours of constant midchest/epgiastric tightness. Not better with NG. Not positional.    K 3.9 Cr 1.46 WBC 7.4 Hgb 13.2 Plt 202  Trop neg x 1,  05/2017 echo LVEF 65-70%, no WMAs, grade I diastolic dysfunction, moderate AS mean grad 21 AVA VTI 1.5 EKG sinus brady RBBB. Prior EKG afib RBBB. Anterior ST/T chagnes in setting of RBBB CXR no acute process Past Medical History:  Diagnosis Date  . Arthritis   . Borderline diabetic   . Glaucoma   . Hyperlipidemia   . Hypertension   . PVD (peripheral vascular disease) ([place])   . Sleep apnea    Cpap ordered but doesnt use    Past Surgical History:  Procedure Laterality Date  . APPENDECTOMY  1943  . CATARACT EXTRACTION  2012   x2  . COLONOSCOPY N/A 11/01/2014   Procedure: COLONOSCOPY;  Surgeon: Aviva Signs, MD;  [facility];  Service: Gastroenterology;  Laterality: N/A;  . ESOPHAGOGASTRODUODENOSCOPY N/A 11/01/2014   Procedure: ESOPHAGOGASTRODUODENOSCOPY (EGD);  Surgeon: Aviva Signs, MD;  [facility];  Service:  Gastroenterology;  Laterality: N/A;  . HERNIA REPAIR Left   . INGUINAL HERNIA REPAIR Right 03/01/2013   Procedure: RIGHT INGUINAL HERNIORRHAPHY;  Surgeon: Jamesetta So, MD;  Location: [facility];  Service: General;  Laterality: Right;  . INSERTION OF MESH Right 03/01/2013   Procedure: INSERTION OF MESH;  Surgeon: Jamesetta So, MD;  Location: [facility];  Service: General;  Laterality: Right;  . NM [place]  06/05/2010   no significant ischemia  . stent  06/14/2010   left leg  . US ECHOCARDIOGRAPHY  01/21/2006   moderate mitral annular ca+, mild MR, AOV moderately sclerotic.     Inpatient Medications: Scheduled Meds: . metoprolol tartrate  5 mg Intravenous Once   Continuous Infusions:  PRN Meds:   Allergies:    Allergies  Allergen Reactions  . Iodine-131 Other (See Comments)    Breakout, fever  . Iohexol      Code: RASH, Desc: PT STATES HOT ALL OVER HAD TO HAVE ICE PACKS ALL OVER HIM AND DIFF BREATHING, PT REFUSED IV DYE     Social History:   Social History   Socioeconomic History  . Marital status: Widowed    Spouse name: Not on file  . Number of children: Not on file  . Years of education: Not on file  . Highest education level: Not on file  Occupational History  . Not on file  Social Needs  . Financial resource strain: Not on file  . Food insecurity:  Worry: Not on file    Inability: Not on file  . Transportation needs:    Medical: Not on file    Non-medical: Not on file  Tobacco Use  . Smoking status: Former Smoker    Last attempt to quit: 01/14/1935    Years since quitting: 82.5  . Smokeless tobacco: Former Systems developer    Types: Chew  Substance and Sexual Activity  . Alcohol use: No  . Drug use: No  . Sexual activity: Not on file  Lifestyle  . Physical activity:    Days per week: Not on file    Minutes per session: Not on file  . Stress: Not on file  Relationships  . Social connections:    Talks on phone: Not on file    Gets together: Not on  file    Attends religious service: Not on file    Active member of club or organization: Not on file    Attends meetings of clubs or organizations: Not on file    Relationship status: Not on file  . Intimate partner violence:    Fear of current or ex partner: Not on file    Emotionally abused: Not on file    Physically abused: Not on file    Forced sexual activity: Not on file  Other Topics Concern  . Not on file  Social History Narrative  . Not on file    Family History:    Family History  Problem Relation Age of Onset  . Cancer Mother   . Heart attack Father   . Stroke Father   . Heart attack Brother   . Heart attack Brother      ROS:  Please see the history of present illness.  All other ROS reviewed and negative.     Physical Exam/Data:   Vitals:   07/25/17 1100 07/25/17 1130 07/25/17 1200 07/25/17 1230  BP: (!) 136/97 122/71 (!) 142/76 125/72  Pulse: 76 (!) 53 (!) 54 (!) 52  Resp: 12 12 10 10   Temp:      TempSrc:      SpO2: 97% 99% 99% 99%  Weight:      Height:       No intake or output data in the 24 hours ending 07/25/17 1248 Filed Weights   07/25/17 0950  Weight: 235 lb (106.6 kg)   Body mass index is 32.78 kg/m.  General:  Well nourished, well developed, in no acute distress HEENT: normal Lymph: no adenopathy Neck: no JVD Endocrine:  No thryomegaly Vascular: No carotid bruits; FA pulses 2+ bilaterally without bruits  Cardiac:  normal S1, S2; brady 55, regular. 3/6 systolic murmur rusb.  Lungs:  clear to auscultation bilaterally, no wheezing, rhonchi or rales  Abd: soft, nontender, no hepatomegaly  Ext: no edema Musculoskeletal:  No deformities, BUE and BLE strength normal and equal Skin: warm and dry  Neuro:  CNs 2-12 intact, no focal abnormalities noted Psych:  Normal affect     Laboratory Data:  Chemistry Recent Labs  Lab 07/25/17 1005  NA 134*  K 3.9  CL 97*  CO2 28  GLUCOSE 190*  BUN 17  CREATININE 1.46*  CALCIUM 9.3    GFRNONAA 41*  GFRAA 48*  ANIONGAP 9    No results for input(s): PROT, ALBUMIN, AST, ALT, ALKPHOS, BILITOT in the last 168 hours. Hematology Recent Labs  Lab 07/25/17 1005  WBC 7.4  RBC 3.91*  HGB 13.2  HCT 39.1  MCV 100.0  MCH 33.8  MCHC 33.8  RDW 13.1  PLT 202   Cardiac Enzymes Recent Labs  Lab 07/25/17 1017  TROPONINI <0.03    Recent Labs  Lab 07/25/17 1010  TROPIPOC 0.01    BNPNo results for input(s): BNP, PROBNP in the last 168 hours.  DDimer No results for input(s): DDIMER in the last 168 hours.  Radiology/Studies:  Dg Chest 2 View  Result Date: 07/25/2017 CLINICAL DATA:  Chest pain EXAM: CHEST - 2 VIEW COMPARISON:  01/31/2017 FINDINGS: The heart size and mediastinal contours are within normal limits. Both lungs are clear. The visualized skeletal structures are unremarkable. IMPRESSION: No active cardiopulmonary disease. Electronically Signed   By: Kathreen Devoid   On: 07/25/2017 11:58    Assessment and Plan:   1. Chest pain - fairly atypical chest pain, lasted constantly about 2 to 2.5 hours without associated symptoms.  - EKG chronic RBBB, more pronounced anterior ST/T depression on presentation when in afib, in setting of RBBB this is nonspecific finding.  - trop negative - with risk factors, symptoms,and nonspecific EKG findings would cycle enzymes and EKGs overnight - symptoms may be have been related to his afib episode.   2. Afib - initial EKG showed afib, follow shows back in SR - no prior known history of afib - CHADS2Vasc score is (age x2, PAD) 3 - would d/c plavix, start eliquis 5mg  bid.  - converted to sinus brady in ER on his own. Monitor on tele overnight. He has underlying conduction disease, not clear he would tolerate regular av nodal agents. Would plan on lopressor 12.5mg  just prn at discharge. If recurrent sustained episodes overnight will need to consider trying low dose regular av nodal agents if rates will tolerate, and if too brady may  have to consider aniarrhythmic. Hopefully his episodes are infrequent and can treat with just prn lopressor  3. Aortic stenosis - moderate, conitnue outpatient monitoring.      For questions or updates, please contact [place] Please consult [email or web address] for contact info under Cardiology/STEMI.   Merrily Pew, MD  07/25/2017 12:48 PM

## 2017-07-25 NOTE — Progress Notes (Signed)
Critical Troponin 0.11.  Mid level notified.

## 2017-07-25 NOTE — H&P (Signed)
History and Physical    BRISTOL SOY SJG:[id number] DOB: 29-Jun-1927 DOA: 07/25/2017  Referring MD/NP/PA: Dr. Sabra Heck PCP: [name], MD  Patient coming from: home   Chief Complaint: Chest pain and A. fib  HPI: [name] is a 84 y.o. male with past medical history significant for diabetes with nephropathy, hyperlipidemia, hypertension, obstructive sleep apnea and paroxysmal atrial fibrillation; who presented to the hospital secondary to chest discomfort.  The pain was localized in the middle of his chest lasting approximately 2 to 2.5 hours before resolving, no associated shortness of breath, nausea, vomiting, diaphoresis or any other symptoms.  Patient reports having some palpitations before experiencing the chest pain and was found in the ED to be on atrial fibrillation.  Patient converted to sinus bradycardia. Patient denies headache, focal weakness, dysuria, hematuria, abd pain, melena, hematochezia, or any other complaints.   In ED EKG non specific and troponin neg X1; cardiology consulted and recommended cycling troponin, EKG and also to monitor on telemetry.   Past Medical/Surgical History: Past Medical History:  Diagnosis Date  . Arthritis   . Borderline diabetic   . Glaucoma   . Hyperlipidemia   . Hypertension   . PVD (peripheral vascular disease) ([place])   . Sleep apnea    Cpap ordered but doesnt use    Past Surgical History:  Procedure Laterality Date  . APPENDECTOMY  1943  . CATARACT EXTRACTION  2012   x2  . COLONOSCOPY N/A 11/01/2014   Procedure: COLONOSCOPY;  Surgeon: Aviva Signs, MD;  Location: [facility];  Service: Gastroenterology;  Laterality: N/A;  . ESOPHAGOGASTRODUODENOSCOPY N/A 11/01/2014   Procedure: ESOPHAGOGASTRODUODENOSCOPY (EGD);  Surgeon: Aviva Signs, MD;  Location: [facility];  Service: Gastroenterology;  Laterality: N/A;  . HERNIA REPAIR Left   . INGUINAL HERNIA REPAIR Right 03/01/2013   Procedure: RIGHT INGUINAL HERNIORRHAPHY;   Surgeon: Jamesetta So, MD;  Location: [facility];  Service: General;  Laterality: Right;  . INSERTION OF MESH Right 03/01/2013   Procedure: INSERTION OF MESH;  Surgeon: Jamesetta So, MD;  Location: [facility];  Service: General;  Laterality: Right;  . NM [place]  06/05/2010   no significant ischemia  . stent  06/14/2010   left leg  . US ECHOCARDIOGRAPHY  01/21/2006   moderate mitral annular ca+, mild MR, AOV moderately sclerotic.    Social History:  reports that he quit smoking about 82 years ago. He has quit using smokeless tobacco. His smokeless tobacco use included chew. He reports that he does not drink alcohol or use drugs.  Allergies: Allergies  Allergen Reactions  . Iodine-131 Other (See Comments)    Breakout, fever  . Iohexol      Code: RASH, Desc: PT STATES HOT ALL OVER HAD TO HAVE ICE PACKS ALL OVER HIM AND DIFF BREATHING, PT REFUSED IV DYE     Family History:  Family History  Problem Relation Age of Onset  . Cancer Mother   . Heart attack Father   . Stroke Father   . Heart attack Brother   . Heart attack Brother     Prior to Admission medications   Medication Sig Start Date End Date Taking? Authorizing Provider  atorvastatin (LIPITOR) 40 MG tablet take 1 tablet by mouth once daily 11/01/16  Yes Hilty, Nadean Corwin, MD  calcium carbonate (TUMS - DOSED IN MG ELEMENTAL CALCIUM) 500 MG chewable tablet Chew 1 tablet by mouth as needed for indigestion or heartburn.   Yes [provider]  cholecalciferol (VITAMIN D) 1000 UNITS tablet Take 1,000 Units by mouth daily.   Yes [provider]  dorzolamide-timolol (COSOPT) 22.3-6.8 MG/ML ophthalmic solution Place 1 drop into both eyes 2 (two) times daily.   Yes [provider]  finasteride (PROSCAR) 5 MG tablet Take 5 mg by mouth daily.   Yes [provider]  furosemide (LASIX) 40 MG tablet Take 1 tablet (40 mg total) by mouth daily. 05/14/17 08/12/17 Yes Hilty, Nadean Corwin, MD  glipiZIDE  (GLUCOTROL) 5 MG tablet Take 5 mg by mouth 2 (two) times daily.    Yes [provider]  glucosamine-chondroitin 500-400 MG tablet Take 3 tablets by mouth 2 (two) times daily.    Yes [provider]  hydrochlorothiazide (MICROZIDE) 12.5 MG capsule Take 12.5 mg by mouth daily.   Yes [provider]  levothyroxine (SYNTHROID, LEVOTHROID) 50 MCG tablet Take 1 tablet by mouth daily. 07/08/16  Yes [provider]  lisinopril (PRINIVIL,ZESTRIL) 5 MG tablet Take 1 tablet (5 mg total) by mouth daily. 07/03/17  Yes Hilty, Nadean Corwin, MD  Methylcellulose, Laxative, (FIBER THERAPY PO) Take by mouth daily.   Yes [provider]  Omega-3 Fatty Acids (FISH OIL PO) Take 1 capsule by mouth daily.   Yes [provider]  pyridOXINE (VITAMIN B-6) 100 MG tablet Take 100 mg by mouth daily.   Yes [provider]  ranitidine (ZANTAC) 150 MG tablet Take 150 mg by mouth at bedtime.   Yes [provider]  tamsulosin (FLOMAX) 0.4 MG CAPS Take 0.4 mg by mouth 2 (two) times daily.    Yes [provider]  vitamin C (ASCORBIC ACID) 500 MG tablet Take 500 mg by mouth daily.   Yes [provider]  vitamin E 400 UNIT capsule Take 400 Units by mouth daily.   Yes [provider]  zinc gluconate 50 MG tablet Take 50 mg by mouth daily.   Yes [provider]    Review of Systems:  10 review of system performed and negative except as otherwise mentioned in HPI.  Physical Exam: Vitals:   07/25/17 1230 07/25/17 1300 07/25/17 1428 07/25/17 1728  BP: 125/72 126/80 128/70 (!) 170/66  Pulse: (!) 52 (!) 54 62 (!) 58  Resp: 10 13 14  (!) 24  Temp:    97.9 F (36.6 C)  TempSrc:    Oral  SpO2: 99% 98% 98% 100%  Weight:      Height:       Constitutional: NAD, calm, comfortable; denying chest pain or shortness of breath currently.  Patient also expressed no palpitations, nausea, vomiting or any other complaints.  He reports to be  hungry and would like something to eat. Eyes: PERRL, lids and conjunctivae normal, no icterus, no nystagmus; corrective glasses in place. ENMT: Mucous membranes are moist. Posterior pharynx clear of any exudate or lesions. No thrush Neck: normal, supple, no masses, no thyromegaly Respiratory: clear to auscultation bilaterally, no wheezing, no crackles. Normal respiratory effort. No accessory muscle use.  Cardiovascular: Bradycardic, sinus rhythm, positive systolic ejection murmur, no rubs, no gallops, no JVD. Abdomen: no tenderness, no masses palpated. No hepatosplenomegaly. Bowel sounds positive.  Musculoskeletal: no clubbing / cyanosis. No joint deformity upper and lower extremities. Good ROM, no contractures. Normal muscle tone.  Skin: no rashes, lesions, ulcers. No induration Neurologic: CN 2-12 grossly intact. Sensation intact, DTR normal. Strength 5/5 in all 4.  Psychiatric: Normal judgment and insight. Alert and oriented x 3. Normal mood.  Labs on Admission: I have personally reviewed the following labs and imaging studies  CBC: Recent Labs  Lab 07/25/17 1005  WBC 7.4  HGB 13.2  HCT 39.1  MCV 100.0  PLT 387   Basic Metabolic Panel: Recent Labs  Lab 07/25/17 1005 07/25/17 1021  NA 134*  --   K 3.9  --   CL 97*  --   CO2 28  --   GLUCOSE 190*  --   BUN 17  --   CREATININE 1.46*  --   CALCIUM 9.3  --   MG  --  2.0   GFR: Estimated Creatinine Clearance: 44.3 mL/min (A) (by C-G formula based on SCr of 1.46 mg/dL (H)).  Cardiac Enzymes: Recent Labs  Lab 07/25/17 1017  TROPONINI <0.03   BNP (last 3 results) Recent Labs    05/09/17 1533  PROBNP 496*   Thyroid Function Tests: Recent Labs    07/25/17 1021  TSH 2.549   Radiological Exams on Admission: Dg Chest 2 View  Result Date: 07/25/2017 CLINICAL DATA:  Chest pain EXAM: CHEST - 2 VIEW COMPARISON:  01/31/2017 FINDINGS: The heart size and mediastinal contours are within normal limits. Both lungs are  clear. The visualized skeletal structures are unremarkable. IMPRESSION: No active cardiopulmonary disease. Electronically Signed   By: Kathreen Devoid   On: 07/25/2017 11:58    EKG: Independently reviewed.  Nonspecific ST/T wave abnormalities.  Right bundle branch block chronic and unchanged.  Assessment/Plan 1-Chest pain: -Most likely associated with A. fib episode. -Patient reports that the pain lasted 2 to 2.5 hours without any associated symptoms and is now resolved. -Continue ACE inhibitors, statins and now on Eliquis. -will cycle troponin and repeat EKG in am -monitor on telemetry   2-Essential hypertension -continue lisinopril and Lasix  -heart healthy diet and sodium monitoring encouraged  3-Mixed hyperlipidemia -continue statins and Lovaza  4-Hypothyroidism -TSH WNL -continue synthroid   5-Chronic diastolic HF (heart failure) (HCC) -compensated -follow daily weights -continue lasix  6-PAF (paroxysmal atrial fibrillation) (HCC) -CHADsVASC score 3  -will monitor on telmetry and might benefit of evaluation by EP -currently sinus bradycardia -per cardiology rec's will discharge on PRN lopressor   7-BPH (benign prostatic hyperplasia)  -continue proscar and flomax -no complaints of urinary retention  8-type 2 diabetes with nephropathy -Check A1c -Hold oral hypoglycemic agents while inpatient -Use sliding scale insulin.  9-chronic kidney disease is stage III -Appears to be stable and at baseline -Follow renal function trend.  DVT prophylaxis: Patient on Eliquis (which satisfies DVT prophylaxis). Code Status: Full code Family Communication: Son and daughter at bedside. Disposition Plan: Anticipate discharge back home once ACS rule out and heart rate controlled. Consults called: cardiology  Admission status: observation, LOS < 2 midnights, telemetry    Time Spent: 70 minutes  Barton Dubois MD [facility] Pager [phone]  If 7PM-7AM, please contact  night-coverage [email or web address] Password [name]  07/25/2017, 6:17 PM

## 2017-07-25 NOTE — Plan of Care (Signed)
  Problem: Coping: Goal: Level of anxiety will decrease Outcome: Progressing   Problem: Pain Managment: Goal: General experience of comfort will improve Outcome: Progressing   Problem: Safety: Goal: Ability to remain free from injury will improve Outcome: Progressing   Problem: Skin Integrity: Goal: Risk for impaired skin integrity will decrease Outcome: Progressing   Problem: Activity: Goal: Risk for activity intolerance will decrease Outcome: Progressing   Problem: Safety: Goal: Ability to remain free from injury will improve Outcome: Progressing   Problem: Skin Integrity: Goal: Risk for impaired skin integrity will decrease Outcome: Progressing

## 2017-07-26 DIAGNOSIS — I4891 Unspecified atrial fibrillation: Secondary | ICD-10-CM | POA: Diagnosis not present

## 2017-07-26 DIAGNOSIS — N400 Enlarged prostate without lower urinary tract symptoms: Secondary | ICD-10-CM | POA: Diagnosis not present

## 2017-07-26 DIAGNOSIS — I5032 Chronic diastolic (congestive) heart failure: Secondary | ICD-10-CM | POA: Diagnosis not present

## 2017-07-26 DIAGNOSIS — I10 Essential (primary) hypertension: Secondary | ICD-10-CM | POA: Diagnosis not present

## 2017-07-26 DIAGNOSIS — I482 Chronic atrial fibrillation, unspecified: Secondary | ICD-10-CM

## 2017-07-26 DIAGNOSIS — R079 Chest pain, unspecified: Secondary | ICD-10-CM | POA: Diagnosis not present

## 2017-07-26 LAB — BASIC METABOLIC PANEL
Anion gap: 10 (ref 5–15)
BUN: 21 mg/dL (ref 8–23)
CO2: 26 mmol/L (ref 22–32)
Calcium: 8.9 mg/dL (ref 8.9–10.3)
Chloride: 99 mmol/L (ref 98–111)
Creatinine, Ser: 1.62 mg/dL — ABNORMAL HIGH (ref 0.61–1.24)
GFR calc Af Amer: 42 mL/min — ABNORMAL LOW (ref >60)
GFR calc non Af Amer: 37 mL/min — ABNORMAL LOW (ref >60)
Glucose, Bld: 173 mg/dL — ABNORMAL HIGH (ref 70–99)
Potassium: 3.7 mmol/L (ref 3.5–5.1)
Sodium: 135 mmol/L (ref 135–145)

## 2017-07-26 LAB — TROPONIN I
Troponin I: 0.12 ng/mL (ref <0.03)
Troponin I: 0.1 ng/mL (ref <0.03)

## 2017-07-26 LAB — CBC
HCT: 37.5 % — ABNORMAL LOW (ref 39.0–52.0)
Hemoglobin: 12.6 g/dL — ABNORMAL LOW (ref 13.0–17.0)
MCH: 33.6 pg (ref 26.0–34.0)
MCHC: 33.6 g/dL (ref 30.0–36.0)
MCV: 100 fL (ref 78.0–100.0)
Platelets: 182 10*3/uL (ref 150–400)
RBC: 3.75 MIL/uL — ABNORMAL LOW (ref 4.22–5.81)
RDW: 13.3 % (ref 11.5–15.5)
WBC: 7.8 10*3/uL (ref 4.0–10.5)

## 2017-07-26 LAB — GLUCOSE, CAPILLARY
Glucose-Capillary: 166 mg/dL — ABNORMAL HIGH (ref 70–99)
Glucose-Capillary: 195 mg/dL — ABNORMAL HIGH (ref 70–99)

## 2017-07-26 MED ORDER — ZINC SULFATE 220 (50 ZN) MG PO CAPS
220.0000 mg | ORAL_CAPSULE | Freq: Every day | ORAL | Status: DC
  Administered 2017-07-26: 220 mg via ORAL
  Filled 2017-07-26: qty 1

## 2017-07-26 MED ORDER — METOPROLOL TARTRATE 25 MG PO TABS: ORAL_TABLET | ORAL | 1 refills | Status: DC

## 2017-07-26 MED ORDER — APIXABAN 2.5 MG PO TABS: 2.5000 mg | ORAL_TABLET | Freq: Two times a day (BID) | ORAL | 1 refills | Status: DC

## 2017-07-26 NOTE — Progress Notes (Signed)
EKG completed and placed on pts chart.  

## 2017-07-26 NOTE — Progress Notes (Signed)
Discharge instructions reviewed with patient and his daughter, [name] who is also healthcare POA per patient's request to have her present. Given copy of AVS and prescriptions. Patient and daughter verbalized understanding of instructions, when to schedule follow-up with PCP and cardiology and when to seek medical attention if further symptoms occur. No c/o pain or discomfort at time of discharge. IV site d/c'd and site within normal limits. Pt in stable condition awaiting nurse tech to assist him off unit for discharge home. Donavan Foil, RN

## 2017-07-26 NOTE — Discharge Summary (Signed)
Physician Discharge Summary  [name] XHB:[id number] DOB: 1927-04-28 DOA: 07/25/2017  PCP: [name], MD  Admit date: 07/25/2017 Discharge date: 07/26/2017  Time spent: 35 minutes  Recommendations for Outpatient Follow-up:  1. Repeat basic metabolic panel to follow electrolytes and renal function 2. Repeat CBC to follow hemoglobin and platelets trend. 3. Outpatient follow-up with cardiology service for EP evaluation and further treatment of his atrial fibrillation. 4. Reassess blood pressure and adjust antihypertensive regimen as needed.   Discharge Diagnoses:  Principal Problem:   Chest pain Active Problems:   Essential hypertension   Mixed hyperlipidemia   Hypothyroidism   Chronic diastolic HF (heart failure) (HCC)   PAF (paroxysmal atrial fibrillation) (HCC)   BPH (benign prostatic hyperplasia)   Atrial fibrillation with rapid ventricular response ([place])   Discharge Condition: Proved.  Patient discharged home with instructions to follow-up with PCP in 10 days and to follow-up with his cardiologist in the next 2 weeks.  Diet recommendation: Heart healthy diet.  Filed Weights   07/25/17 0950 07/26/17 0634  Weight: 106.6 kg (235 lb) 104.7 kg (230 lb 12.8 oz)    History of present illness:  84 y.o. male with past medical history significant for diabetes with nephropathy, hyperlipidemia, hypertension, obstructive sleep apnea and paroxysmal atrial fibrillation; who presented to the hospital secondary to chest discomfort.  The pain was localized in the middle of his chest lasting approximately 2 to 2.5 hours before resolving, no associated shortness of breath, nausea, vomiting, diaphoresis or any other symptoms.  Patient reports having some palpitations before experiencing the chest pain and was found in the ED to be on atrial fibrillation.  Patient converted to sinus bradycardia. Patient denies headache, focal weakness, dysuria, hematuria, abd pain, melena, hematochezia, or  any other complaints.    Hospital Course:  1-Chest pain: Appears to be demand ischemia in the setting of atrial fibrillation episode. -Very mild flat elevation in his troponin with subsequent trending down -No abnormalities appreciated on repeat EKG or telemetry -Heart rate has remained stable/mildly bradycardic; no further episodes of A. Fib. -Following cardiology recommendations patient will be discharged on Eliquis 2.5 mg twice a day and he will be follow-up as an outpatient with EP service. -The new ACE inhibitors and statins.  2-Essential hypertension -Blood pressure has remained stable to slightly soft. -At this moment will continue lisinopril and Lasix -Patient advised to follow heart healthy diet and to monitor closely sodium intake.    3-Mixed hyperlipidemia -Continue statins and Lovaza.  4-Hypothyroidism -TSH within normal limits -Continue Synthroid at current dose.  5-Chronic diastolic HF (heart failure) (HCC) -Stable and compensated -Instructed to follow daily weights and low-sodium diet -Continue current dose of diuretics  6-PAF (paroxysmal atrial fibrillation) (HCC) -CHADsVASC score 3 -No further atrial fibrillation or arrhythmia appreciated on telemetry or repeat EKG. -Patient remains to be mildly bradycardic -Following cardiology recommendations a prescription for Lopressor 12.5 mg to be used as needed was provided. -EP evaluation as an outpatient regarding cardiology follow-up.  7-BPH (benign prostatic hyperplasia) -No complaints of urinary retention -Continue Flomax and Proscar.  8-type 2 diabetes with nephropathy -A1c 8.1 -Resume oral hypoglycemic regimen and modify carbohydrates diet -Outpatient follow-up with PCP for further adjustment on his hypoglycemic medication.  9-chronic kidney disease is stage III -Creatinine has remained stable and at baseline -Patient advised to keep himself well-hydrated -Repeat basic metabolic panel to check  electrolytes and renal function noted follow-up visit. -Cr baseline 1.4-1.6   Procedures:  See below for x-ray reports.  Consultations:  Cardiology service.  Discharge Exam: Vitals:   07/26/17 0634 07/26/17 1100  BP: (!) 126/55 123/61  Pulse: (!) 51 (!) 59  Resp: 17 18  Temp: (!) 97.5 F (36.4 C) 97.6 F (36.4 C)  SpO2: 95% 96%    General: Afebrile, no chest pain, no shortness of breath, no palpitations, no nausea or vomiting; patient asking for discharge home.  Cardiovascular: Mild sinus bradycardia, positive systolic ejection murmur, no rubs, no gallops, no JVD. Respiratory: Air movement bilaterally, no wheezing, no crackles, no using accessory muscles. Abdomen: Soft, nontender, nondistended, positive bowel sounds Extremities: No cyanosis, no clubbing,no edema.  Discharge Instructions   Discharge Instructions    Diet - low sodium heart healthy   Complete by:  As directed    Discharge instructions   Complete by:  As directed    Take medications as prescribed No NSAID's Monitor urine and stools for any bleeding Arrange follow up with PCP in 10 days Arrange follow up with cardiology service in 2 weeks (contact office for appointment details) Keep yourself well hydrated Follow heart healthy diet     Allergies as of 07/26/2017      Reactions   Iodine-131 Other (See Comments)   Breakout, fever   Iohexol     Code: RASH, Desc: PT STATES HOT ALL OVER HAD TO HAVE ICE PACKS ALL OVER HIM AND DIFF BREATHING, PT REFUSED IV DYE      Medication List    STOP taking these medications   hydrochlorothiazide 12.5 MG capsule Commonly known as:  MICROZIDE     TAKE these medications   apixaban 2.5 MG Tabs tablet Commonly known as:  ELIQUIS Take 1 tablet (2.5 mg total) by mouth 2 (two) times daily.   atorvastatin 40 MG tablet Commonly known as:  LIPITOR take 1 tablet by mouth once daily   calcium carbonate 500 MG chewable tablet Commonly known as:  TUMS - dosed in mg  elemental calcium Chew 1 tablet by mouth as needed for indigestion or heartburn.   cholecalciferol 1000 units tablet Commonly known as:  VITAMIN D Take 1,000 Units by mouth daily.   dorzolamide-timolol 22.3-6.8 MG/ML ophthalmic solution Commonly known as:  COSOPT Place 1 drop into both eyes 2 (two) times daily.   FIBER THERAPY PO Take by mouth daily.   finasteride 5 MG tablet Commonly known as:  PROSCAR Take 5 mg by mouth daily.   FISH OIL PO Take 1 capsule by mouth daily.   furosemide 40 MG tablet Commonly known as:  LASIX Take 1 tablet (40 mg total) by mouth daily.   glipiZIDE 5 MG tablet Commonly known as:  GLUCOTROL Take 5 mg by mouth 2 (two) times daily.   glucosamine-chondroitin 500-400 MG tablet Take 3 tablets by mouth 2 (two) times daily.   levothyroxine 50 MCG tablet Commonly known as:  SYNTHROID, LEVOTHROID Take 1 tablet by mouth daily.   lisinopril 5 MG tablet Commonly known as:  PRINIVIL,ZESTRIL Take 1 tablet (5 mg total) by mouth daily.   metoprolol tartrate 25 MG tablet Commonly known as:  LOPRESSOR Take 12.5 mg as needed for palpitations/heart racing   pyridOXINE 100 MG tablet Commonly known as:  VITAMIN B-6 Take 100 mg by mouth daily.   ranitidine 150 MG tablet Commonly known as:  ZANTAC Take 150 mg by mouth at bedtime.   tamsulosin 0.4 MG Caps capsule Commonly known as:  FLOMAX Take 0.4 mg by mouth 2 (two) times daily.   vitamin C 500 MG  tablet Commonly known as:  ASCORBIC ACID Take 500 mg by mouth daily.   vitamin E 400 UNIT capsule Take 400 Units by mouth daily.   zinc gluconate 50 MG tablet Take 50 mg by mouth daily.      Allergies  Allergen Reactions  . Iodine-131 Other (See Comments)    Breakout, fever  . Iohexol      Code: RASH, Desc: PT STATES HOT ALL OVER HAD TO HAVE ICE PACKS ALL OVER HIM AND DIFF BREATHING, PT REFUSED IV DYE    Follow-up Information    [name], MD. Schedule an appointment as soon as possible  for a visit in 10 day(s).   Specialty:  Family Medicine Contact information: [address] [place] [address] [phone]        Pixie Casino, MD. Schedule an appointment as soon as possible for a visit in 2 week(s).   Specialty:  Cardiology Why:  call offcie for appointment details Contact information: [place] [address] [phone]           The results of significant diagnostics from this hospitalization (including imaging, microbiology, ancillary and laboratory) are listed below for reference.    Significant Diagnostic Studies: Dg Chest 2 View  Result Date: 07/25/2017 CLINICAL DATA:  Chest pain EXAM: CHEST - 2 VIEW COMPARISON:  01/31/2017 FINDINGS: The heart size and mediastinal contours are within normal limits. Both lungs are clear. The visualized skeletal structures are unremarkable. IMPRESSION: No active cardiopulmonary disease. Electronically Signed   By: Kathreen Devoid   On: 07/25/2017 11:58    Microbiology: No results found for this or any previous visit (from the past 240 hour(s)).   Labs: Basic Metabolic Panel: Recent Labs  Lab 07/25/17 1005 07/25/17 1021 07/26/17 0718  NA 134*  --  135  K 3.9  --  3.7  CL 97*  --  99  CO2 28  --  26  GLUCOSE 190*  --  173*  BUN 17  --  21  CREATININE 1.46*  --  1.62*  CALCIUM 9.3  --  8.9  MG  --  2.0  --    CBC: Recent Labs  Lab 07/25/17 1005 07/26/17 0718  WBC 7.4 7.8  HGB 13.2 12.6*  HCT 39.1 37.5*  MCV 100.0 100.0  PLT 202 182   Cardiac Enzymes: Recent Labs  Lab 07/25/17 1017 07/25/17 1846 07/26/17 0035 07/26/17 0718  TROPONINI <0.03 0.11* 0.12* 0.10*   ProBNP (last 3 results) Recent Labs    05/09/17 1533  PROBNP 496*    CBG: Recent Labs  Lab 07/25/17 2216 07/26/17 0737 07/26/17 1122  GLUCAP 175* 166* 195*     Signed:  Barton Dubois MD.  [facility] 07/26/2017, 2:48 PM

## 2017-07-26 NOTE — Discharge Instructions (Signed)
Chest Wall Pain °Chest wall pain is pain in or around the bones and muscles of your chest. Sometimes, an injury causes this pain. Sometimes, the cause may not be known. This pain may take several weeks or longer to get better. °Follow these instructions at home: °Pay attention to any changes in your symptoms. Take these actions to help with your pain: °· Rest as told by your doctor. °· Avoid activities that cause pain. Try not to use your chest, belly (abdominal), or side muscles to lift heavy things. °· If directed, apply ice to the painful area: °? Put ice in a plastic bag. °? Place a towel between your skin and the bag. °? Leave the ice on for 20 minutes, 2-3 times per day. °· Take over-the-counter and prescription medicines only as told by your doctor. °· Do not use tobacco products, including cigarettes, chewing tobacco, and e-cigarettes. If you need help quitting, ask your doctor. °· Keep all follow-up visits as told by your doctor. This is important. ° °Contact a doctor if: °· You have a fever. °· Your chest pain gets worse. °· You have new symptoms. °Get help right away if: °· You feel sick to your stomach (nauseous) or you throw up (vomit). °· You feel sweaty or light-headed. °· You have a cough with phlegm (sputum) or you cough up blood. °· You are short of breath. °This information is not intended to replace advice given to you by your health care provider. Make sure you discuss any questions you have with your health care provider. °Document Released: 06/19/2007 Document Revised: 06/08/2015 Document Reviewed: 03/28/2014 °Elsevier Interactive Patient Education © 2018 [employer] ° °

## 2017-07-28 ENCOUNTER — Telehealth: Payer: Self-pay | Admitting: Internal Medicine

## 2017-07-28 NOTE — Telephone Encounter (Signed)
Pt's dtr [name] calling. Pt seen in ED over the weekend with chest pains, meds were changed and was told to see Dr. Debara Pickett this week. I see he is out, does he need to see someone else this week, or since meds were changed see him when he comes back? pls advise

## 2017-07-28 NOTE — Telephone Encounter (Signed)
The patient's daughter called in stating that the patient had been hospitalized over the weekend. She was calling to make a follow up appointment. None were available this week and the patient will be out of town next week. Appointment made for 7/29 with Dr. Debara Pickett. She has been made aware to call back if anything is needed before then.

## 2017-07-30 DIAGNOSIS — I10 Essential (primary) hypertension: Secondary | ICD-10-CM | POA: Diagnosis not present

## 2017-07-30 DIAGNOSIS — I2510 Atherosclerotic heart disease of native coronary artery without angina pectoris: Secondary | ICD-10-CM | POA: Diagnosis not present

## 2017-07-30 DIAGNOSIS — I4891 Unspecified atrial fibrillation: Secondary | ICD-10-CM | POA: Diagnosis not present

## 2017-07-30 DIAGNOSIS — E782 Mixed hyperlipidemia: Secondary | ICD-10-CM | POA: Diagnosis not present

## 2017-08-11 ENCOUNTER — Ambulatory Visit: Payer: Medicare Other | Admitting: Internal Medicine

## 2017-08-11 ENCOUNTER — Encounter: Payer: Self-pay | Admitting: Internal Medicine

## 2017-08-11 VITALS — BP 115/62 | HR 65 | Ht 71.0 in | Wt 233.4 lb

## 2017-08-11 DIAGNOSIS — I4510 Unspecified right bundle-branch block: Secondary | ICD-10-CM | POA: Diagnosis not present

## 2017-08-11 DIAGNOSIS — I4891 Unspecified atrial fibrillation: Secondary | ICD-10-CM | POA: Diagnosis not present

## 2017-08-11 DIAGNOSIS — I350 Nonrheumatic aortic (valve) stenosis: Secondary | ICD-10-CM | POA: Diagnosis not present

## 2017-08-11 DIAGNOSIS — I739 Peripheral vascular disease, unspecified: Secondary | ICD-10-CM

## 2017-08-11 MED ORDER — APIXABAN 2.5 MG PO TABS: 2.5000 mg | ORAL_TABLET | Freq: Two times a day (BID) | ORAL | 3 refills | Status: DC

## 2017-08-11 NOTE — Progress Notes (Signed)
OFFICE NOTE  Chief Complaint:  Follow-up hospitalization  Primary Care Physician: [name], MD  HPI:  [name]  is an 84 year old gentleman with history of hypertension, dyslipidemia and peripheral vascular disease. He did have claudication and in May 2012 he had high-grade mid SFA stenosis. He had a stent placed in his SFA which improved his claudication and his Doppler studies. In December 2012 his left ABI was 0.61, the right ABI 0.74 with a patent stent. He was seen back by Dr. Gwenlyn Found and recommended followup with me in the office today. His only complaints now are about occasional dizziness especially with change in position, also leg pain with walking but not claudication, mostly joint pain, hip pain which could represent arthritis, wearing glasses.   I the pleasure seeing [name] back today. It's been over a year and a half since I saw him last in the office. He reports doing fairly well and denies any chest pain or worsening shortness of breath. He denies any claudication in his legs. Blood pressure is noted to be elevated somewhat today. He is previous film lisinopril and this was stopped for unknown reasons. Not on aspirin but continues to take Plavix for his PAD.  [name] returns today for follow-up. Overall he is doing fairly well though has complaints about particular right hip pain and knee pain. He also reports some swelling in his legs gets worse during the day but improves with elevating them overnight. He says he gets some numbness and tingling in his feet as well. He had arterial Dopplers last year which show stable ABIs and no evidence of stent occlusion. He is due for repeat arterial Dopplers in April of this year. He denies any chest pain or worsening shortness of breath.  06/12/2016  [name] returns today for follow-up. Recently he saw his nephrologist to auscultated a systolic heart murmur. An echocardiogram was ordered at [facility]. This  demonstrated an EF of 60-65%, there was mild to moderate aortic stenosis with a mean gradient of 15 mmHg and calculated valve area of 1.3-1.4 cm. He denies chest pain, worsening shortness of breath, presyncope, syncope or congestive heart failure symptoms. His only other complaint today is bilateral knee pain. He has a history of knee surgery and has also been having some right hip pain. Blood pressure is at goal. He is on atorvastatin 40 mg daily followed by his primary care provider. He has a history of PAD with lower extremity arterial Dopplers in April 2017 that showed no obstructive PAD and a patent stent.  05/09/2017  [name] returns today for follow-up.  He is reporting some mild shortness of breath as well as lower extremity edema.  According to records he is gained 6 pounds since we last saw him.  He reports worsening lower extremity swelling.  He was scheduled to have a routine echocardiogram on May 30 and office visit with me on May 31, however came in earlier because of worsening shortness of breath.  EKG just shows sinus rhythm with first-degree AV block at 63, RBBB-personally reviewed.  His daughter was accompanying him today and noted that he has been out of several of his medications.  Namely he has not taken tamsulosin, finasteride and levothyroxine.  He is also out of his hydrochlorothiazide diuretic.  06/13/2017  [name] returns today for follow-up.  He had an echocardiogram yesterday which showed LVEF of 65-705%, however he now has moderate aortic stenosis, increased from mild stenosis a  year ago.  He reports marked improvement in his edema although weight is about a pound heavier than it was when I last saw him a month ago.  He is on Lasix now.  Creatinine is slightly higher on the Lasix at 1.6, but may be accepted due to his improvement in edema.  08/11/2017  [name] was seen today in follow-up.  Unfortunately was hospitalized in June with chest heaviness.  He was seen by Dr.  Harl Bowie in consultation who felt that his chest pain was atypical for cardiac pain but was noted to be a new onset atrial fibrillation.  Fortunately spontaneously converted back to sinus however was appropriately switched to Eliquis from Plavix.  He denies any recurrent A. fib since then has had no further chest discomfort.  I suspect he felt unwell either due to the A. fib or as a combination of A. fib and moderate aortic stenosis.  He is scheduled ready for a repeat echocardiogram in November and follow-up with me afterwards.  PMHx:  Past Medical History:  Diagnosis Date  . Arthritis   . Borderline diabetic   . Glaucoma   . Hyperlipidemia   . Hypertension   . PVD (peripheral vascular disease) ([place])   . Sleep apnea    Cpap ordered but doesnt use    Past Surgical History:  Procedure Laterality Date  . APPENDECTOMY  1943  . CATARACT EXTRACTION  2012   x2  . COLONOSCOPY N/A 11/01/2014   Procedure: COLONOSCOPY;  Surgeon: Aviva Signs, MD;  [facility];  Service: Gastroenterology;  Laterality: N/A;  . ESOPHAGOGASTRODUODENOSCOPY N/A 11/01/2014   Procedure: ESOPHAGOGASTRODUODENOSCOPY (EGD);  Surgeon: Aviva Signs, MD;  Location: [facility];  Service: Gastroenterology;  Laterality: N/A;  . HERNIA REPAIR Left   . INGUINAL HERNIA REPAIR Right 03/01/2013   Procedure: RIGHT INGUINAL HERNIORRHAPHY;  Surgeon: Jamesetta So, MD;  Location: [facility] ORS;  Service: General;  Laterality: Right;  . INSERTION OF MESH Right 03/01/2013   Procedure: INSERTION OF MESH;  Surgeon: Jamesetta So, MD;  Location: [facility] ORS;  Service: General;  Laterality: Right;  . NM [place]  06/05/2010   no significant ischemia  . stent  06/14/2010   left leg  . US ECHOCARDIOGRAPHY  01/21/2006   moderate mitral annular ca+, mild MR, AOV moderately sclerotic.    FAMHx:  Family History  Problem Relation Age of Onset  . Cancer Mother   . Heart attack Father   . Stroke Father   . Heart attack Brother     . Heart attack Brother     SOCHx:   reports that he quit smoking about 82 years ago. He has quit using smokeless tobacco. His smokeless tobacco use included chew. He reports that he does not drink alcohol or use drugs.  ALLERGIES:  Allergies  Allergen Reactions  . Iodinated Diagnostic Agents     Other reaction(s): Fever  . Iodine-131 Other (See Comments)    Breakout, fever  . Iohexol      Code: RASH, Desc: PT STATES HOT ALL OVER HAD TO HAVE ICE PACKS ALL OVER HIM AND DIFF BREATHING, PT REFUSED IV DYE     ROS: Pertinent items noted in HPI and remainder of comprehensive ROS otherwise negative.  HOME MEDS: Current Outpatient Medications  Medication Sig Dispense Refill  . apixaban (ELIQUIS) 2.5 MG TABS tablet Take 1 tablet (2.5 mg total) by mouth 2 (two) times daily. 60 tablet 1  . atorvastatin (LIPITOR) 40  MG tablet take 1 tablet by mouth once daily 90 tablet 3  . calcium carbonate (TUMS - DOSED IN MG ELEMENTAL CALCIUM) 500 MG chewable tablet Chew 1 tablet by mouth as needed for indigestion or heartburn.    . dorzolamide-timolol (COSOPT) 22.3-6.8 MG/ML ophthalmic solution Place 1 drop into both eyes 2 (two) times daily.    . finasteride (PROSCAR) 5 MG tablet Take 5 mg by mouth daily.    . furosemide (LASIX) 40 MG tablet Take 1 tablet (40 mg total) by mouth daily. 30 tablet 5  . glipiZIDE (GLUCOTROL) 5 MG tablet Take 5 mg by mouth 2 (two) times daily.     [name] glucosamine-chondroitin 500-400 MG tablet Take 3 tablets by mouth 2 (two) times daily.     [name] levothyroxine (SYNTHROID, LEVOTHROID) 50 MCG tablet Take 1 tablet by mouth daily.  0  . lisinopril (PRINIVIL,ZESTRIL) 5 MG tablet Take 1 tablet (5 mg total) by mouth daily. 90 tablet 3  . Methylcellulose, Laxative, (FIBER THERAPY PO) Take by mouth daily.    . metoprolol tartrate (LOPRESSOR) 25 MG tablet Take 12.5 mg as needed for palpitations/heart racing 30 tablet 1  . Omega-3 Fatty Acids (FISH OIL PO) Take 1 capsule by mouth daily.     [name] pyridOXINE (VITAMIN B-6) 100 MG tablet Take 100 mg by mouth daily.    . ranitidine (ZANTAC) 150 MG tablet Take 150 mg by mouth at bedtime.    . tamsulosin (FLOMAX) 0.4 MG CAPS Take 0.4 mg by mouth 2 (two) times daily.     . vitamin C (ASCORBIC ACID) 500 MG tablet Take 500 mg by mouth daily.    . vitamin E 400 UNIT capsule Take 400 Units by mouth daily.    [name] zinc gluconate 50 MG tablet Take 50 mg by mouth daily.     No current facility-administered medications for this visit.     LABS/IMAGING: No results found for this or any previous visit (from the past 48 hour(s)). No results found.  VITALS: BP 115/62   Pulse 65   Ht 5\' 11"  (1.803 m)   Wt 233 lb 6.4 oz (105.9 kg)   BMI 32.55 kg/m   EXAM: General appearance: alert and no distress Neck: no carotid bruit and no JVD Lungs: clear to auscultation bilaterally Heart: regular rate and rhythm, S1, S2 normal and systolic murmur: early systolic 3/6, crescendo at 2nd right intercostal space Abdomen: soft, non-tender; bowel sounds normal; no masses,  no organomegaly Extremities: extremities normal, atraumatic, no cyanosis or edema Pulses: 2+ and symmetric Skin: Skin color, texture, turgor normal. No rashes or lesions Neurologic: Grossly normal  EKG: Deferred  ASSESSMENT: 1. Recent PAF - CHADSVASC score of 5 (on Eliquis) 2. Acute on chronic diastolic congestive heart failure-secondary to medication noncompliance 3. Moderate aortic stenosis 4. New RBBB/bifascicular block 5. PAD status post right SFA stent -stable dopplers, no claudication 6. Hypertension- controlled 7. Dizziness 8. Dyslipidemia 9. Asymptomatic bradycardia   PLAN: 1.   [name] had recent hospitalization for chest heaviness.  He was found to be a new onset A. fib.  He ruled out for MI.  He was appropriate switched to Eliquis.  He seems to be tolerating this well.  He does have at least moderate aortic stenosis which will need to reassess as planned in November.   He will follow-up with me afterwards.  Follow-up in 3 months.  Pixie Casino, MD, [facility], [place] Director of the  Advanced Lipid Disorders &  Cardiovascular Risk Reduction Clinic Diplomate of the American Board of Clinical Lipidology Attending Cardiologist  Direct Dial: [phone]  Fax: [phone]  Website:  [email or web address] 08/11/2017, 3:07 PM

## 2017-08-11 NOTE — Patient Instructions (Signed)
Please keep your scheduled appointments in November

## 2017-08-13 DIAGNOSIS — M170 Bilateral primary osteoarthritis of knee: Secondary | ICD-10-CM | POA: Diagnosis not present

## 2017-08-19 DIAGNOSIS — M7021 Olecranon bursitis, right elbow: Secondary | ICD-10-CM | POA: Diagnosis not present

## 2017-08-19 DIAGNOSIS — Z1389 Encounter for screening for other disorder: Secondary | ICD-10-CM | POA: Diagnosis not present

## 2017-08-20 DIAGNOSIS — M170 Bilateral primary osteoarthritis of knee: Secondary | ICD-10-CM | POA: Diagnosis not present

## 2017-08-22 DIAGNOSIS — E1151 Type 2 diabetes mellitus with diabetic peripheral angiopathy without gangrene: Secondary | ICD-10-CM | POA: Diagnosis not present

## 2017-08-22 DIAGNOSIS — E119 Type 2 diabetes mellitus without complications: Secondary | ICD-10-CM | POA: Diagnosis not present

## 2017-08-22 DIAGNOSIS — M7021 Olecranon bursitis, right elbow: Secondary | ICD-10-CM | POA: Diagnosis not present

## 2017-08-28 DIAGNOSIS — M1712 Unilateral primary osteoarthritis, left knee: Secondary | ICD-10-CM | POA: Diagnosis not present

## 2017-08-28 DIAGNOSIS — M1711 Unilateral primary osteoarthritis, right knee: Secondary | ICD-10-CM | POA: Diagnosis not present

## 2017-10-13 DIAGNOSIS — N183 Chronic kidney disease, stage 3 (moderate): Secondary | ICD-10-CM | POA: Diagnosis not present

## 2017-10-13 DIAGNOSIS — D509 Iron deficiency anemia, unspecified: Secondary | ICD-10-CM | POA: Diagnosis not present

## 2017-10-13 DIAGNOSIS — E559 Vitamin D deficiency, unspecified: Secondary | ICD-10-CM | POA: Diagnosis not present

## 2017-10-13 DIAGNOSIS — Z79899 Other long term (current) drug therapy: Secondary | ICD-10-CM | POA: Diagnosis not present

## 2017-10-13 DIAGNOSIS — I10 Essential (primary) hypertension: Secondary | ICD-10-CM | POA: Diagnosis not present

## 2017-10-13 DIAGNOSIS — R809 Proteinuria, unspecified: Secondary | ICD-10-CM | POA: Diagnosis not present

## 2017-10-15 ENCOUNTER — Other Ambulatory Visit (HOSPITAL_COMMUNITY): Payer: Self-pay | Admitting: Nephrology

## 2017-10-15 ENCOUNTER — Ambulatory Visit (HOSPITAL_COMMUNITY)
Admission: RE | Admit: 2017-10-15 | Discharge: 2017-10-15 | Disposition: A | Discharge status: Home / Self Care | Payer: Medicare Other | Source: Ambulatory Visit | Attending: Nephrology | Admitting: Nephrology

## 2017-10-15 DIAGNOSIS — E871 Hypo-osmolality and hyponatremia: Secondary | ICD-10-CM | POA: Diagnosis not present

## 2017-10-15 DIAGNOSIS — I158 Other secondary hypertension: Secondary | ICD-10-CM | POA: Insufficient documentation

## 2017-10-15 DIAGNOSIS — N183 Chronic kidney disease, stage 3 (moderate): Secondary | ICD-10-CM | POA: Diagnosis not present

## 2017-10-15 DIAGNOSIS — R0989 Other specified symptoms and signs involving the circulatory and respiratory systems: Secondary | ICD-10-CM | POA: Diagnosis not present

## 2017-10-15 DIAGNOSIS — D638 Anemia in other chronic diseases classified elsewhere: Secondary | ICD-10-CM | POA: Diagnosis not present

## 2017-10-15 DIAGNOSIS — R05 Cough: Secondary | ICD-10-CM | POA: Diagnosis not present

## 2017-10-16 DIAGNOSIS — M170 Bilateral primary osteoarthritis of knee: Secondary | ICD-10-CM | POA: Diagnosis not present

## 2017-10-20 DIAGNOSIS — L600 Ingrowing nail: Secondary | ICD-10-CM | POA: Diagnosis not present

## 2017-10-20 DIAGNOSIS — L089 Local infection of the skin and subcutaneous tissue, unspecified: Secondary | ICD-10-CM | POA: Diagnosis not present

## 2017-10-21 DIAGNOSIS — L600 Ingrowing nail: Secondary | ICD-10-CM | POA: Diagnosis not present

## 2017-10-21 DIAGNOSIS — L089 Local infection of the skin and subcutaneous tissue, unspecified: Secondary | ICD-10-CM | POA: Diagnosis not present

## 2017-10-23 DIAGNOSIS — E1151 Type 2 diabetes mellitus with diabetic peripheral angiopathy without gangrene: Secondary | ICD-10-CM | POA: Diagnosis not present

## 2017-10-23 DIAGNOSIS — Z0001 Encounter for general adult medical examination with abnormal findings: Secondary | ICD-10-CM | POA: Diagnosis not present

## 2017-10-23 DIAGNOSIS — N183 Chronic kidney disease, stage 3 (moderate): Secondary | ICD-10-CM | POA: Diagnosis not present

## 2017-10-23 DIAGNOSIS — Z1389 Encounter for screening for other disorder: Secondary | ICD-10-CM | POA: Diagnosis not present

## 2017-10-23 DIAGNOSIS — E782 Mixed hyperlipidemia: Secondary | ICD-10-CM | POA: Diagnosis not present

## 2017-10-31 ENCOUNTER — Other Ambulatory Visit: Payer: Self-pay | Admitting: Internal Medicine

## 2017-11-06 DIAGNOSIS — Z23 Encounter for immunization: Secondary | ICD-10-CM | POA: Diagnosis not present

## 2017-11-06 DIAGNOSIS — M7021 Olecranon bursitis, right elbow: Secondary | ICD-10-CM | POA: Diagnosis not present

## 2017-11-24 ENCOUNTER — Ambulatory Visit (HOSPITAL_COMMUNITY)
Admission: RE | Admit: 2017-11-24 | Discharge: 2017-11-24 | Disposition: A | Discharge status: Home / Self Care | Payer: Medicare Other | Source: Ambulatory Visit | Attending: Family Medicine | Admitting: Family Medicine

## 2017-11-24 DIAGNOSIS — I350 Nonrheumatic aortic (valve) stenosis: Secondary | ICD-10-CM | POA: Diagnosis not present

## 2017-11-24 NOTE — Progress Notes (Signed)
*  PRELIMINARY RESULTS* Echocardiogram 2D Echocardiogram has been performed.  Samuel Germany 11/24/2017, 11:28 AM

## 2017-11-27 DIAGNOSIS — E1129 Type 2 diabetes mellitus with other diabetic kidney complication: Secondary | ICD-10-CM | POA: Diagnosis not present

## 2017-11-27 DIAGNOSIS — J329 Chronic sinusitis, unspecified: Secondary | ICD-10-CM | POA: Diagnosis not present

## 2017-11-27 DIAGNOSIS — H6592 Unspecified nonsuppurative otitis media, left ear: Secondary | ICD-10-CM | POA: Diagnosis not present

## 2017-11-27 DIAGNOSIS — I10 Essential (primary) hypertension: Secondary | ICD-10-CM | POA: Diagnosis not present

## 2017-11-30 ENCOUNTER — Other Ambulatory Visit: Payer: Self-pay | Admitting: Internal Medicine

## 2017-12-09 ENCOUNTER — Encounter: Payer: Self-pay | Admitting: Internal Medicine

## 2017-12-09 ENCOUNTER — Ambulatory Visit: Payer: Medicare Other | Admitting: Internal Medicine

## 2017-12-09 VITALS — BP 144/76 | HR 62 | Ht 71.0 in | Wt 234.0 lb

## 2017-12-09 DIAGNOSIS — I739 Peripheral vascular disease, unspecified: Secondary | ICD-10-CM

## 2017-12-09 DIAGNOSIS — I4510 Unspecified right bundle-branch block: Secondary | ICD-10-CM

## 2017-12-09 DIAGNOSIS — I4891 Unspecified atrial fibrillation: Secondary | ICD-10-CM | POA: Diagnosis not present

## 2017-12-09 DIAGNOSIS — I350 Nonrheumatic aortic (valve) stenosis: Secondary | ICD-10-CM

## 2017-12-09 NOTE — Patient Instructions (Signed)
Medication Instructions:  Continue current medications If you need a refill on your cardiac medications before your next appointment, please call your pharmacy.   Testing/Procedures: Your physician has requested that you have an echocardiogram. Echocardiography is a painless test that uses sound waves to create images of your heart. It provides your doctor with information about the size and shape of your heart and how well your heart's chambers and valves are working. This procedure takes approximately one hour. There are no restrictions for this procedure. -- due November 2020 @ [facility]  Follow-Up: At [facility], you and your health needs are our priority.  As part of our continuing mission to provide you with exceptional heart care, we have created designated Provider Care Teams.  These Care Teams include your primary Cardiologist (physician) and Advanced Practice Providers (APPs -  Physician Assistants and Nurse Practitioners) who all work together to provide you with the care you need, when you need it. You will need a follow up appointment in 12 months.  Please call our office 2 months in advance to schedule this appointment.  You may see Dr. Debara Pickett or one of the following Advanced Practice Providers on your designated Care Team: Almyra Deforest, [place] . Fabian Sharp, PA-C  Any Other Special Instructions Will Be Listed Below (If Applicable).

## 2017-12-09 NOTE — Progress Notes (Signed)
OFFICE NOTE  Chief Complaint:  Follow-up echo  Primary Care Physician: [name], MD  HPI:  [name]  is an 84 year old gentleman with history of hypertension, dyslipidemia and peripheral vascular disease. He did have claudication and in May 2012 he had high-grade mid SFA stenosis. He had a stent placed in his SFA which improved his claudication and his Doppler studies. In December 2012 his left ABI was 0.61, the right ABI 0.74 with a patent stent. He was seen back by Dr. Gwenlyn Found and recommended followup with me in the office today. His only complaints now are about occasional dizziness especially with change in position, also leg pain with walking but not claudication, mostly joint pain, hip pain which could represent arthritis, wearing glasses.   I the pleasure seeing [name] back today. It's been over a year and a half since I saw him last in the office. He reports doing fairly well and denies any chest pain or worsening shortness of breath. He denies any claudication in his legs. Blood pressure is noted to be elevated somewhat today. He is previous film lisinopril and this was stopped for unknown reasons. Not on aspirin but continues to take Plavix for his PAD.  [name] returns today for follow-up. Overall he is doing fairly well though has complaints about particular right hip pain and knee pain. He also reports some swelling in his legs gets worse during the day but improves with elevating them overnight. He says he gets some numbness and tingling in his feet as well. He had arterial Dopplers last year which show stable ABIs and no evidence of stent occlusion. He is due for repeat arterial Dopplers in April of this year. He denies any chest pain or worsening shortness of breath.  06/12/2016  [name] returns today for follow-up. Recently he saw his nephrologist to auscultated a systolic heart murmur. An echocardiogram was ordered at [facility]. This demonstrated an  EF of 60-65%, there was mild to moderate aortic stenosis with a mean gradient of 15 mmHg and calculated valve area of 1.3-1.4 cm. He denies chest pain, worsening shortness of breath, presyncope, syncope or congestive heart failure symptoms. His only other complaint today is bilateral knee pain. He has a history of knee surgery and has also been having some right hip pain. Blood pressure is at goal. He is on atorvastatin 40 mg daily followed by his primary care provider. He has a history of PAD with lower extremity arterial Dopplers in April 2017 that showed no obstructive PAD and a patent stent.  05/09/2017  [name] returns today for follow-up.  He is reporting some mild shortness of breath as well as lower extremity edema.  According to records he is gained 6 pounds since we last saw him.  He reports worsening lower extremity swelling.  He was scheduled to have a routine echocardiogram on May 30 and office visit with me on May 31, however came in earlier because of worsening shortness of breath.  EKG just shows sinus rhythm with first-degree AV block at 63, RBBB-personally reviewed.  His daughter was accompanying him today and noted that he has been out of several of his medications.  Namely he has not taken tamsulosin, finasteride and levothyroxine.  He is also out of his hydrochlorothiazide diuretic.  06/13/2017  [name] returns today for follow-up.  He had an echocardiogram yesterday which showed LVEF of 65-705%, however he now has moderate aortic stenosis, increased from mild stenosis a  year ago.  He reports marked improvement in his edema although weight is about a pound heavier than it was when I last saw him a month ago.  He is on Lasix now.  Creatinine is slightly higher on the Lasix at 1.6, but may be accepted due to his improvement in edema.  08/11/2017  [name] was seen today in follow-up.  Unfortunately was hospitalized in June with chest heaviness.  He was seen by Dr. Harl Bowie in  consultation who felt that his chest pain was atypical for cardiac pain but was noted to be a new onset atrial fibrillation.  Fortunately spontaneously converted back to sinus however was appropriately switched to Eliquis from Plavix.  He denies any recurrent A. fib since then has had no further chest discomfort.  I suspect he felt unwell either due to the A. fib or as a combination of A. fib and moderate aortic stenosis.  He is scheduled ready for a repeat echocardiogram in November and follow-up with me afterwards.  12/09/2017  [name] is seen today for follow-up of her recent echo.  This was a repeat study to assess any changes in aortic stenosis.  His last study was over 6 months ago.  This demonstrates moderate aortic stenosis with a mean gradient of 26 mmHg, from 21 mmHg previously.  He is asymptomatic with this and denies any recurrent atrial fibrillation.  EKG today shows sinus rhythm.  He is compliant with Eliquis and denies any bleeding problems.  Blood pressures been well controlled at home.  PMHx:  Past Medical History:  Diagnosis Date  . Arthritis   . Borderline diabetic   . Glaucoma   . Hyperlipidemia   . Hypertension   . PVD (peripheral vascular disease) ([place])   . Sleep apnea    Cpap ordered but doesnt use    Past Surgical History:  Procedure Laterality Date  . APPENDECTOMY  1943  . CATARACT EXTRACTION  2012   x2  . COLONOSCOPY N/A 11/01/2014   Procedure: COLONOSCOPY;  Surgeon: Aviva Signs, MD;  [facility];  Service: Gastroenterology;  Laterality: N/A;  . ESOPHAGOGASTRODUODENOSCOPY N/A 11/01/2014   Procedure: ESOPHAGOGASTRODUODENOSCOPY (EGD);  Surgeon: Aviva Signs, MD;  [facility];  Service: Gastroenterology;  Laterality: N/A;  . HERNIA REPAIR Left   . INGUINAL HERNIA REPAIR Right 03/01/2013   Procedure: RIGHT INGUINAL HERNIORRHAPHY;  Surgeon: Jamesetta So, MD;  Location: [facility] ORS;  Service: General;  Laterality: Right;  . INSERTION OF MESH  Right 03/01/2013   Procedure: INSERTION OF MESH;  Surgeon: Jamesetta So, MD;  Location: [facility];  Service: General;  Laterality: Right;  . NM [place]  06/05/2010   no significant ischemia  . stent  06/14/2010   left leg  . US ECHOCARDIOGRAPHY  01/21/2006   moderate mitral annular ca+, mild MR, AOV moderately sclerotic.    FAMHx:  Family History  Problem Relation Age of Onset  . Cancer Mother   . Heart attack Father   . Stroke Father   . Heart attack Brother   . Heart attack Brother     SOCHx:   reports that he quit smoking about 82 years ago. He has quit using smokeless tobacco.  His smokeless tobacco use included chew. He reports that he does not drink alcohol or use drugs.  ALLERGIES:  Allergies  Allergen Reactions  . Iodinated Diagnostic Agents     Other reaction(s): Fever  . Iodine-131 Other (See Comments)  Breakout, fever  . Iohexol      Code: RASH, Desc: PT STATES HOT ALL OVER HAD TO HAVE ICE PACKS ALL OVER HIM AND DIFF BREATHING, PT REFUSED IV DYE     ROS: Pertinent items noted in HPI and remainder of comprehensive ROS otherwise negative.  HOME MEDS: Current Outpatient Medications  Medication Sig Dispense Refill  . apixaban (ELIQUIS) 2.5 MG TABS tablet Take 1 tablet (2.5 mg total) by mouth 2 (two) times daily. 180 tablet 3  . atorvastatin (LIPITOR) 40 MG tablet take 1 tablet by mouth once daily 90 tablet 3  . calcium carbonate (TUMS - DOSED IN MG ELEMENTAL CALCIUM) 500 MG chewable tablet Chew 1 tablet by mouth as needed for indigestion or heartburn.    . dorzolamide-timolol (COSOPT) 22.3-6.8 MG/ML ophthalmic solution Place 1 drop into both eyes 2 (two) times daily.    . finasteride (PROSCAR) 5 MG tablet Take 5 mg by mouth daily.    . furosemide (LASIX) 40 MG tablet TAKE 1 TABLET(40 MG) BY MOUTH DAILY 30 tablet 8  . glipiZIDE (GLUCOTROL) 5 MG tablet Take 5 mg by mouth 2 (two) times daily.     [name] glucosamine-chondroitin 500-400 MG tablet Take 3  tablets by mouth 2 (two) times daily.     [name] levothyroxine (SYNTHROID, LEVOTHROID) 50 MCG tablet Take 1 tablet by mouth daily.  0  . lisinopril (PRINIVIL,ZESTRIL) 5 MG tablet Take 1 tablet (5 mg total) by mouth daily. 90 tablet 3  . Methylcellulose, Laxative, (FIBER THERAPY PO) Take by mouth daily.    . metoprolol tartrate (LOPRESSOR) 25 MG tablet Take 12.5 mg as needed for palpitations/heart racing 30 tablet 1  . Omega-3 Fatty Acids (FISH OIL PO) Take 1 capsule by mouth daily.    [name] pyridOXINE (VITAMIN B-6) 100 MG tablet Take 100 mg by mouth daily.    . ranitidine (ZANTAC) 150 MG tablet Take 150 mg by mouth at bedtime.    . tamsulosin (FLOMAX) 0.4 MG CAPS Take 0.4 mg by mouth 2 (two) times daily.     . vitamin C (ASCORBIC ACID) 500 MG tablet Take 500 mg by mouth daily.    . vitamin E 400 UNIT capsule Take 400 Units by mouth daily.    [name] zinc gluconate 50 MG tablet Take 50 mg by mouth daily.     No current facility-administered medications for this visit.     LABS/IMAGING: No results found for this or any previous visit (from the past 48 hour(s)). No results found.  VITALS: BP (!) 144/76   Pulse 62   Ht 5\' 11"  (1.803 m)   Wt 234 lb (106.1 kg)   BMI 32.64 kg/m   EXAM: General appearance: alert and no distress Neck: no carotid bruit and no JVD Lungs: clear to auscultation bilaterally Heart: regular rate and rhythm, S1, S2 normal and systolic murmur: early systolic 3/6, crescendo at 2nd right intercostal space Abdomen: soft, non-tender; bowel sounds normal; no masses,  no organomegaly Extremities: extremities normal, atraumatic, no cyanosis or edema Pulses: 2+ and symmetric Skin: Skin color, texture, turgor normal. No rashes or lesions Neurologic: Grossly normal  EKG: Normal sinus rhythm at 62, RBBB-personally reviewed  ASSESSMENT: 1. Recent PAF - CHADSVASC score of 5 (on Eliquis) 2. Acute on chronic diastolic congestive heart failure-secondary to medication  noncompliance 3. Moderate aortic stenosis 4. New RBBB/bifascicular block 5. PAD status post right SFA stent -stable dopplers, no claudication 6. Hypertension- controlled 7. Dizziness 8. Dyslipidemia 9. Asymptomatic bradycardia  PLAN: 1.   Mr. Victory is doing well on Eliquis.  He denies any worsening atrial fibrillation.  He is in sinus today.  He has no claudication.  His aortic stenosis is moderate and mildly worse than it was 6 months ago.  We will plan a repeat study in a year.  No changes to her blood pressure medicines today.  Plan follow-up with me annually or sooner as necessary.  Pixie Casino, MD, [facility], [place] Director of the Advanced Lipid Disorders &  Cardiovascular Risk Reduction Clinic Diplomate of the American Board of Clinical Lipidology Attending Cardiologist  Direct Dial: [phone]  Fax: [phone]  Website:  [email or web address]  12/09/2017, 2:17 PM

## 2017-12-10 DIAGNOSIS — T162XXA Foreign body in left ear, initial encounter: Secondary | ICD-10-CM | POA: Diagnosis not present

## 2017-12-10 DIAGNOSIS — H9012 Conductive hearing loss, unilateral, left ear, with unrestricted hearing on the contralateral side: Secondary | ICD-10-CM | POA: Diagnosis not present

## 2017-12-18 DIAGNOSIS — T162XXA Foreign body in left ear, initial encounter: Secondary | ICD-10-CM | POA: Diagnosis not present

## 2017-12-30 DIAGNOSIS — Z961 Presence of intraocular lens: Secondary | ICD-10-CM | POA: Diagnosis not present

## 2017-12-30 DIAGNOSIS — E119 Type 2 diabetes mellitus without complications: Secondary | ICD-10-CM | POA: Diagnosis not present

## 2017-12-30 DIAGNOSIS — D3132 Benign neoplasm of left choroid: Secondary | ICD-10-CM | POA: Diagnosis not present

## 2017-12-30 DIAGNOSIS — H5210 Myopia, unspecified eye: Secondary | ICD-10-CM | POA: Diagnosis not present

## 2017-12-30 DIAGNOSIS — H401132 Primary open-angle glaucoma, bilateral, moderate stage: Secondary | ICD-10-CM | POA: Diagnosis not present

## 2018-01-13 DIAGNOSIS — R3914 Feeling of incomplete bladder emptying: Secondary | ICD-10-CM | POA: Diagnosis not present

## 2018-01-13 DIAGNOSIS — B379 Candidiasis, unspecified: Secondary | ICD-10-CM | POA: Diagnosis not present

## 2018-01-26 DIAGNOSIS — N183 Chronic kidney disease, stage 3 (moderate): Secondary | ICD-10-CM | POA: Diagnosis not present

## 2018-01-26 DIAGNOSIS — I10 Essential (primary) hypertension: Secondary | ICD-10-CM | POA: Diagnosis not present

## 2018-01-26 DIAGNOSIS — E559 Vitamin D deficiency, unspecified: Secondary | ICD-10-CM | POA: Diagnosis not present

## 2018-01-26 DIAGNOSIS — D509 Iron deficiency anemia, unspecified: Secondary | ICD-10-CM | POA: Diagnosis not present

## 2018-01-26 DIAGNOSIS — Z79899 Other long term (current) drug therapy: Secondary | ICD-10-CM | POA: Diagnosis not present

## 2018-01-26 DIAGNOSIS — R809 Proteinuria, unspecified: Secondary | ICD-10-CM | POA: Diagnosis not present

## 2018-01-28 DIAGNOSIS — N183 Chronic kidney disease, stage 3 (moderate): Secondary | ICD-10-CM | POA: Diagnosis not present

## 2018-01-28 DIAGNOSIS — D638 Anemia in other chronic diseases classified elsewhere: Secondary | ICD-10-CM | POA: Diagnosis not present

## 2018-02-02 DIAGNOSIS — D225 Melanocytic nevi of trunk: Secondary | ICD-10-CM | POA: Diagnosis not present

## 2018-02-02 DIAGNOSIS — L821 Other seborrheic keratosis: Secondary | ICD-10-CM | POA: Diagnosis not present

## 2018-02-02 DIAGNOSIS — L570 Actinic keratosis: Secondary | ICD-10-CM | POA: Diagnosis not present

## 2018-02-06 ENCOUNTER — Telehealth: Payer: Self-pay | Admitting: *Deleted

## 2018-02-06 DIAGNOSIS — M25561 Pain in right knee: Secondary | ICD-10-CM | POA: Diagnosis not present

## 2018-02-06 NOTE — Telephone Encounter (Signed)
Patient came to office today asking for samples of Eliquis. Eliquis 2.5 mg #2 lot # [id number] exp 2/21 given, confirmed 2.5 mg dose

## 2018-02-17 ENCOUNTER — Other Ambulatory Visit: Payer: Self-pay | Admitting: Internal Medicine

## 2018-03-05 DIAGNOSIS — N183 Chronic kidney disease, stage 3 (moderate): Secondary | ICD-10-CM | POA: Diagnosis not present

## 2018-03-05 DIAGNOSIS — E1151 Type 2 diabetes mellitus with diabetic peripheral angiopathy without gangrene: Secondary | ICD-10-CM | POA: Diagnosis not present

## 2018-03-05 DIAGNOSIS — E039 Hypothyroidism, unspecified: Secondary | ICD-10-CM | POA: Diagnosis not present

## 2018-03-05 DIAGNOSIS — E7849 Other hyperlipidemia: Secondary | ICD-10-CM | POA: Diagnosis not present

## 2018-03-05 DIAGNOSIS — I2510 Atherosclerotic heart disease of native coronary artery without angina pectoris: Secondary | ICD-10-CM | POA: Diagnosis not present

## 2018-03-19 DIAGNOSIS — Z1389 Encounter for screening for other disorder: Secondary | ICD-10-CM | POA: Diagnosis not present

## 2018-03-19 DIAGNOSIS — N183 Chronic kidney disease, stage 3 (moderate): Secondary | ICD-10-CM | POA: Diagnosis not present

## 2018-04-06 DIAGNOSIS — Z0001 Encounter for general adult medical examination with abnormal findings: Secondary | ICD-10-CM | POA: Diagnosis not present

## 2018-04-06 DIAGNOSIS — N183 Chronic kidney disease, stage 3 (moderate): Secondary | ICD-10-CM | POA: Diagnosis not present

## 2018-04-06 DIAGNOSIS — E1165 Type 2 diabetes mellitus with hyperglycemia: Secondary | ICD-10-CM | POA: Diagnosis not present

## 2018-04-06 DIAGNOSIS — Z1389 Encounter for screening for other disorder: Secondary | ICD-10-CM | POA: Diagnosis not present

## 2018-05-07 ENCOUNTER — Other Ambulatory Visit: Payer: Self-pay

## 2018-05-07 ENCOUNTER — Telehealth: Payer: Self-pay | Admitting: Podiatry

## 2018-05-07 ENCOUNTER — Ambulatory Visit: Payer: Medicare Other | Admitting: Podiatry

## 2018-05-07 ENCOUNTER — Other Ambulatory Visit: Payer: Self-pay | Admitting: Podiatry

## 2018-05-07 ENCOUNTER — Encounter: Payer: Self-pay | Admitting: Podiatry

## 2018-05-07 ENCOUNTER — Ambulatory Visit (INDEPENDENT_AMBULATORY_CARE_PROVIDER_SITE_OTHER): Payer: Medicare Other

## 2018-05-07 VITALS — BP 177/79 | HR 59 | Temp 97.2°F | Resp 16

## 2018-05-07 DIAGNOSIS — S90211A Contusion of right great toe with damage to nail, initial encounter: Secondary | ICD-10-CM

## 2018-05-07 DIAGNOSIS — L03031 Cellulitis of right toe: Secondary | ICD-10-CM

## 2018-05-07 DIAGNOSIS — M79674 Pain in right toe(s): Secondary | ICD-10-CM | POA: Diagnosis not present

## 2018-05-07 MED ORDER — DOXYCYCLINE HYCLATE 100 MG PO TABS: 100.0000 mg | ORAL_TABLET | Freq: Two times a day (BID) | ORAL | 0 refills | Status: DC

## 2018-05-07 NOTE — Telephone Encounter (Signed)
Pts daughter called. Pt had a toenail removed today and was told to start soaks tomorrow but was not given the soaking instructions. Please give patients daughter a call back.

## 2018-05-07 NOTE — Progress Notes (Signed)
   Subjective:    Patient ID: [name], male    DOB: 1927-01-26, 85 y.o.   MRN: [id number]  HPI    Review of Systems  All other systems reviewed and are negative.      Objective:   Physical Exam        Assessment & Plan:

## 2018-05-08 NOTE — Progress Notes (Signed)
Subjective:   Patient ID: [name], male   DOB: 85 y.o.   MRN: [id number]   HPI Patient presents with caregiver with a very painful right hallux nail states that it is been loose red he tried to trim it himself and it is become draining.  States it sore and it is hard for him to wear shoe gear with it.  Patient does not smoke likes to be active and does have atrial fibrillation and does take medicine for it   Review of Systems  All other systems reviewed and are negative.       Objective:  Physical Exam Vitals signs and nursing note reviewed.  Constitutional:      Appearance: He is well-developed.  Pulmonary:     Effort: Pulmonary effort is normal.  Musculoskeletal: Normal range of motion.  Skin:    General: Skin is warm.  Neurological:     Mental Status: He is alert.     Neurovascular status is diminished but adequate with patient noted to have a damaged right hallux nail that is loose and is painful with drainage occurring on both medial and lateral sides with redness to the inner phalangeal joint and nothing proximal to this point.  Patient did not have any systemic indications of infection and appears to be localized and it is quite tender when palpated     Assessment:  Significant paronychia infection right hallux that is affecting both sides and across the nailbed with drainage and localized process     Plan:  H&P condition and education rendered to patient and caregiver.  Today I infiltrated 60 mg like Marcaine mixture using sterile instrumentation I removed 2 different borders I removed proud flesh from each side flush the area and in the center nail was found to be loose and I removed it entirely.  I flushed the area out completely I did not note any bone exposure applied sterile dressing instructed on soaks and placed on doxycycline twice daily and gave strict instructions if any redness swelling or systemic signs of infection were to occur to come in immediately or  to go to the emergency room  X-ray indicates that there is no signs of any bone infection or pathology from that perspective

## 2018-05-08 NOTE — Telephone Encounter (Signed)
LVM informing patient of soaking instructions, he is to return my phone call with any questions or concerns

## 2018-05-20 DIAGNOSIS — L089 Local infection of the skin and subcutaneous tissue, unspecified: Secondary | ICD-10-CM | POA: Diagnosis not present

## 2018-05-21 DIAGNOSIS — L089 Local infection of the skin and subcutaneous tissue, unspecified: Secondary | ICD-10-CM | POA: Diagnosis not present

## 2018-05-22 DIAGNOSIS — L089 Local infection of the skin and subcutaneous tissue, unspecified: Secondary | ICD-10-CM | POA: Diagnosis not present

## 2018-05-29 ENCOUNTER — Inpatient Hospital Stay (HOSPITAL_COMMUNITY): Payer: Medicare Other

## 2018-05-29 ENCOUNTER — Emergency Department (HOSPITAL_COMMUNITY): Payer: Medicare Other

## 2018-05-29 ENCOUNTER — Other Ambulatory Visit: Payer: Self-pay

## 2018-05-29 ENCOUNTER — Encounter (HOSPITAL_COMMUNITY): Payer: Self-pay | Admitting: Emergency Medicine

## 2018-05-29 ENCOUNTER — Inpatient Hospital Stay (HOSPITAL_COMMUNITY)
Admission: EM | Admit: 2018-05-29 | Discharge: 2018-06-01 | DRG: 638 | Disposition: A | Discharge status: Home Health | Payer: Medicare Other | Attending: Internal Medicine | Admitting: Internal Medicine

## 2018-05-29 DIAGNOSIS — E785 Hyperlipidemia, unspecified: Secondary | ICD-10-CM | POA: Diagnosis not present

## 2018-05-29 DIAGNOSIS — I482 Chronic atrial fibrillation, unspecified: Secondary | ICD-10-CM | POA: Diagnosis not present

## 2018-05-29 DIAGNOSIS — I5032 Chronic diastolic (congestive) heart failure: Secondary | ICD-10-CM | POA: Diagnosis not present

## 2018-05-29 DIAGNOSIS — L03031 Cellulitis of right toe: Secondary | ICD-10-CM | POA: Diagnosis present

## 2018-05-29 DIAGNOSIS — Z823 Family history of stroke: Secondary | ICD-10-CM | POA: Diagnosis not present

## 2018-05-29 DIAGNOSIS — Z1159 Encounter for screening for other viral diseases: Secondary | ICD-10-CM | POA: Diagnosis not present

## 2018-05-29 DIAGNOSIS — E1169 Type 2 diabetes mellitus with other specified complication: Secondary | ICD-10-CM | POA: Diagnosis not present

## 2018-05-29 DIAGNOSIS — Z79890 Hormone replacement therapy (postmenopausal): Secondary | ICD-10-CM

## 2018-05-29 DIAGNOSIS — N400 Enlarged prostate without lower urinary tract symptoms: Secondary | ICD-10-CM | POA: Diagnosis present

## 2018-05-29 DIAGNOSIS — L03115 Cellulitis of right lower limb: Secondary | ICD-10-CM

## 2018-05-29 DIAGNOSIS — E1121 Type 2 diabetes mellitus with diabetic nephropathy: Secondary | ICD-10-CM

## 2018-05-29 DIAGNOSIS — L089 Local infection of the skin and subcutaneous tissue, unspecified: Secondary | ICD-10-CM | POA: Diagnosis not present

## 2018-05-29 DIAGNOSIS — I480 Paroxysmal atrial fibrillation: Secondary | ICD-10-CM | POA: Diagnosis present

## 2018-05-29 DIAGNOSIS — Z87891 Personal history of nicotine dependence: Secondary | ICD-10-CM | POA: Diagnosis not present

## 2018-05-29 DIAGNOSIS — Z888 Allergy status to other drugs, medicaments and biological substances status: Secondary | ICD-10-CM | POA: Diagnosis not present

## 2018-05-29 DIAGNOSIS — E11621 Type 2 diabetes mellitus with foot ulcer: Secondary | ICD-10-CM | POA: Diagnosis not present

## 2018-05-29 DIAGNOSIS — M7989 Other specified soft tissue disorders: Secondary | ICD-10-CM | POA: Diagnosis present

## 2018-05-29 DIAGNOSIS — E1151 Type 2 diabetes mellitus with diabetic peripheral angiopathy without gangrene: Secondary | ICD-10-CM | POA: Diagnosis not present

## 2018-05-29 DIAGNOSIS — L97509 Non-pressure chronic ulcer of other part of unspecified foot with unspecified severity: Secondary | ICD-10-CM | POA: Diagnosis not present

## 2018-05-29 DIAGNOSIS — I10 Essential (primary) hypertension: Secondary | ICD-10-CM | POA: Diagnosis not present

## 2018-05-29 DIAGNOSIS — E1122 Type 2 diabetes mellitus with diabetic chronic kidney disease: Secondary | ICD-10-CM | POA: Diagnosis not present

## 2018-05-29 DIAGNOSIS — Z7901 Long term (current) use of anticoagulants: Secondary | ICD-10-CM | POA: Diagnosis not present

## 2018-05-29 DIAGNOSIS — E039 Hypothyroidism, unspecified: Secondary | ICD-10-CM | POA: Diagnosis not present

## 2018-05-29 DIAGNOSIS — H409 Unspecified glaucoma: Secondary | ICD-10-CM | POA: Diagnosis present

## 2018-05-29 DIAGNOSIS — N183 Chronic kidney disease, stage 3 (moderate): Secondary | ICD-10-CM | POA: Diagnosis not present

## 2018-05-29 DIAGNOSIS — Z91041 Radiographic dye allergy status: Secondary | ICD-10-CM

## 2018-05-29 DIAGNOSIS — I130 Hypertensive heart and chronic kidney disease with heart failure and stage 1 through stage 4 chronic kidney disease, or unspecified chronic kidney disease: Secondary | ICD-10-CM | POA: Diagnosis present

## 2018-05-29 DIAGNOSIS — Z79899 Other long term (current) drug therapy: Secondary | ICD-10-CM | POA: Diagnosis not present

## 2018-05-29 DIAGNOSIS — K219 Gastro-esophageal reflux disease without esophagitis: Secondary | ICD-10-CM | POA: Diagnosis not present

## 2018-05-29 DIAGNOSIS — Z8249 Family history of ischemic heart disease and other diseases of the circulatory system: Secondary | ICD-10-CM

## 2018-05-29 DIAGNOSIS — Z03818 Encounter for observation for suspected exposure to other biological agents ruled out: Secondary | ICD-10-CM | POA: Diagnosis not present

## 2018-05-29 LAB — BASIC METABOLIC PANEL
Anion gap: 10 (ref 5–15)
BUN: 19 mg/dL (ref 8–23)
CO2: 26 mmol/L (ref 22–32)
Calcium: 9.2 mg/dL (ref 8.9–10.3)
Chloride: 99 mmol/L (ref 98–111)
Creatinine, Ser: 1.4 mg/dL — ABNORMAL HIGH (ref 0.61–1.24)
GFR calc Af Amer: 52 mL/min — ABNORMAL LOW (ref >60)
GFR calc non Af Amer: 45 mL/min — ABNORMAL LOW (ref >60)
Glucose, Bld: 135 mg/dL — ABNORMAL HIGH (ref 70–99)
Potassium: 4.3 mmol/L (ref 3.5–5.1)
Sodium: 135 mmol/L (ref 135–145)

## 2018-05-29 LAB — GLUCOSE, CAPILLARY
Glucose-Capillary: 128 mg/dL — ABNORMAL HIGH (ref 70–99)
Glucose-Capillary: 145 mg/dL — ABNORMAL HIGH (ref 70–99)

## 2018-05-29 LAB — CBC WITH DIFFERENTIAL/PLATELET
Abs Immature Granulocytes: 0.03 10*3/uL (ref 0.00–0.07)
Basophils Absolute: 0 10*3/uL (ref 0.0–0.1)
Basophils Relative: 1 %
Eosinophils Absolute: 0.3 10*3/uL (ref 0.0–0.5)
Eosinophils Relative: 4 %
HCT: 37.3 % — ABNORMAL LOW (ref 39.0–52.0)
Hemoglobin: 12.1 g/dL — ABNORMAL LOW (ref 13.0–17.0)
Immature Granulocytes: 0 %
Lymphocytes Relative: 31 %
Lymphs Abs: 2.7 10*3/uL (ref 0.7–4.0)
MCH: 32.8 pg (ref 26.0–34.0)
MCHC: 32.4 g/dL (ref 30.0–36.0)
MCV: 101.1 fL — ABNORMAL HIGH (ref 80.0–100.0)
Monocytes Absolute: 0.8 10*3/uL (ref 0.1–1.0)
Monocytes Relative: 9 %
Neutro Abs: 4.8 10*3/uL (ref 1.7–7.7)
Neutrophils Relative %: 55 %
Platelets: 224 10*3/uL (ref 150–400)
RBC: 3.69 MIL/uL — ABNORMAL LOW (ref 4.22–5.81)
RDW: 12.7 % (ref 11.5–15.5)
WBC: 8.8 10*3/uL (ref 4.0–10.5)
nRBC: 0 % (ref 0.0–0.2)

## 2018-05-29 LAB — TSH: TSH: 3.572 u[IU]/mL (ref 0.350–4.500)

## 2018-05-29 LAB — SARS CORONAVIRUS 2 BY RT PCR (HOSPITAL ORDER, PERFORMED IN ~~LOC~~ HOSPITAL LAB): SARS Coronavirus 2: NEGATIVE

## 2018-05-29 LAB — HEMOGLOBIN A1C
Hgb A1c MFr Bld: 7 % — ABNORMAL HIGH (ref 4.8–5.6)
Mean Plasma Glucose: 154.2 mg/dL

## 2018-05-29 LAB — C-REACTIVE PROTEIN: CRP: <0.8 mg/dL (ref <1.0)

## 2018-05-29 LAB — LACTIC ACID, PLASMA
Lactic Acid, Venous: 1 mmol/L (ref 0.5–1.9)
Lactic Acid, Venous: 1.1 mmol/L (ref 0.5–1.9)

## 2018-05-29 MED ORDER — ATORVASTATIN CALCIUM 40 MG PO TABS
40.0000 mg | ORAL_TABLET | Freq: Every day | ORAL | Status: DC
  Administered 2018-05-30 – 2018-06-01 (×3): 40 mg via ORAL
  Filled 2018-05-29 (×3): qty 1

## 2018-05-29 MED ORDER — LISINOPRIL 5 MG PO TABS: 5.0000 mg | ORAL_TABLET | Freq: Every day | ORAL | Status: DC

## 2018-05-29 MED ORDER — ONDANSETRON HCL 4 MG PO TABS: 4.0000 mg | ORAL_TABLET | Freq: Four times a day (QID) | ORAL | Status: DC | PRN

## 2018-05-29 MED ORDER — INSULIN ASPART 100 UNIT/ML ~~LOC~~ SOLN: 0.0000 [IU] | Freq: Every day | SUBCUTANEOUS | Status: DC

## 2018-05-29 MED ORDER — CALCIUM CARBONATE ANTACID 500 MG PO CHEW: 1.0000 | CHEWABLE_TABLET | ORAL | Status: DC | PRN

## 2018-05-29 MED ORDER — SODIUM CHLORIDE 0.9 % IV SOLN
INTRAVENOUS | Status: AC
  Administered 2018-05-29: 17:00:00 via INTRAVENOUS

## 2018-05-29 MED ORDER — CLINDAMYCIN PHOSPHATE 600 MG/50ML IV SOLN
600.0000 mg | Freq: Once | INTRAVENOUS | Status: AC
  Administered 2018-05-29: 600 mg via INTRAVENOUS
  Filled 2018-05-29: qty 50

## 2018-05-29 MED ORDER — TAMSULOSIN HCL 0.4 MG PO CAPS
0.4000 mg | ORAL_CAPSULE | Freq: Every day | ORAL | Status: DC
  Administered 2018-05-29 – 2018-05-31 (×3): 0.4 mg via ORAL
  Filled 2018-05-29 (×3): qty 1

## 2018-05-29 MED ORDER — DORZOLAMIDE HCL-TIMOLOL MAL 2-0.5 % OP SOLN
1.0000 [drp] | Freq: Two times a day (BID) | OPHTHALMIC | Status: DC
  Administered 2018-05-29 – 2018-06-01 (×6): 1 [drp] via OPHTHALMIC
  Filled 2018-05-29 (×2): qty 10

## 2018-05-29 MED ORDER — VITAMIN B-6 50 MG PO TABS
100.0000 mg | ORAL_TABLET | Freq: Every day | ORAL | Status: DC
  Administered 2018-05-30 – 2018-06-01 (×3): 100 mg via ORAL
  Filled 2018-05-29: qty 1
  Filled 2018-05-29: qty 2
  Filled 2018-05-29 (×2): qty 1
  Filled 2018-05-29 (×2): qty 2
  Filled 2018-05-29: qty 1

## 2018-05-29 MED ORDER — APIXABAN 2.5 MG PO TABS
2.5000 mg | ORAL_TABLET | Freq: Two times a day (BID) | ORAL | Status: DC
  Administered 2018-05-29 – 2018-06-01 (×6): 2.5 mg via ORAL
  Filled 2018-05-29 (×6): qty 1

## 2018-05-29 MED ORDER — INSULIN ASPART 100 UNIT/ML ~~LOC~~ SOLN
0.0000 [IU] | Freq: Three times a day (TID) | SUBCUTANEOUS | Status: DC
  Administered 2018-05-29 – 2018-05-30 (×4): 1 [IU] via SUBCUTANEOUS
  Administered 2018-05-31: 2 [IU] via SUBCUTANEOUS
  Administered 2018-05-31 – 2018-06-01 (×2): 1 [IU] via SUBCUTANEOUS
  Administered 2018-06-01: 13:00:00 2 [IU] via SUBCUTANEOUS

## 2018-05-29 MED ORDER — ACETAMINOPHEN 650 MG RE SUPP: 650.0000 mg | Freq: Four times a day (QID) | RECTAL | Status: DC | PRN

## 2018-05-29 MED ORDER — CLINDAMYCIN PHOSPHATE 600 MG/50ML IV SOLN
600.0000 mg | Freq: Three times a day (TID) | INTRAVENOUS | Status: DC
  Administered 2018-05-29 – 2018-06-01 (×9): 600 mg via INTRAVENOUS
  Filled 2018-05-29 (×9): qty 50

## 2018-05-29 MED ORDER — INSULIN DETEMIR 100 UNIT/ML ~~LOC~~ SOLN
5.0000 [IU] | Freq: Every day | SUBCUTANEOUS | Status: DC
  Administered 2018-05-29 – 2018-05-31 (×3): 5 [IU] via SUBCUTANEOUS
  Filled 2018-05-29 (×4): qty 0.05

## 2018-05-29 MED ORDER — PANTOPRAZOLE SODIUM 40 MG PO TBEC
40.0000 mg | DELAYED_RELEASE_TABLET | Freq: Every day | ORAL | Status: DC
  Administered 2018-05-30 – 2018-06-01 (×3): 40 mg via ORAL
  Filled 2018-05-29 (×3): qty 1

## 2018-05-29 MED ORDER — ACETAMINOPHEN 325 MG PO TABS: 650.0000 mg | ORAL_TABLET | Freq: Four times a day (QID) | ORAL | Status: DC | PRN

## 2018-05-29 MED ORDER — FUROSEMIDE 40 MG PO TABS
40.0000 mg | ORAL_TABLET | Freq: Every day | ORAL | Status: DC
  Administered 2018-05-30 – 2018-06-01 (×3): 40 mg via ORAL
  Filled 2018-05-29 (×3): qty 1

## 2018-05-29 MED ORDER — FINASTERIDE 5 MG PO TABS
5.0000 mg | ORAL_TABLET | Freq: Every day | ORAL | Status: DC
  Administered 2018-05-30 – 2018-06-01 (×3): 5 mg via ORAL
  Filled 2018-05-29 (×3): qty 1

## 2018-05-29 MED ORDER — LEVOTHYROXINE SODIUM 50 MCG PO TABS
50.0000 ug | ORAL_TABLET | Freq: Every day | ORAL | Status: DC
  Administered 2018-05-30 – 2018-06-01 (×3): 50 ug via ORAL
  Filled 2018-05-29 (×3): qty 1

## 2018-05-29 MED ORDER — ONDANSETRON HCL 4 MG/2ML IJ SOLN: 4.0000 mg | Freq: Four times a day (QID) | INTRAMUSCULAR | Status: DC | PRN

## 2018-05-29 NOTE — ED Provider Notes (Signed)
[facility] EMERGENCY DEPARTMENT Provider Note   CSN: [id number] Arrival date & time: 05/29/18  2703    History   Chief Complaint Chief Complaint  Patient presents with  . Foot Problem    HPI [name] is a 86 y.o. male.     HPI  Pt was seen at 0835. Per pt, c/o gradual onset and worsening of persistent right great toe "infection" that began 3 weeks ago.  Pt states he had an infection around his toenail, had his toenail removed, and abx started. Pt has been on several courses of outpatient PO and IM antibiotics without improvement in his "infection." Pt's PMD called ED this morning after pt evaluation and is requesting admission for IV antibiotics, possible sugical treatment. Denies fevers, cough, known COVID+ exposures. Denies injury, no focal motor weakness.    Past Medical History:  Diagnosis Date  . Arthritis   . Borderline diabetic   . Glaucoma   . Hyperlipidemia   . Hypertension   . PVD (peripheral vascular disease) ([place])   . Sleep apnea    Cpap ordered but doesnt use    Patient Active Problem List   Diagnosis Date Noted  . Atrial fibrillation ([place])   . Chest pain 07/25/2017  . PAF (paroxysmal atrial fibrillation) ([place]) 07/25/2017  . BPH (benign prostatic hyperplasia) 07/25/2017  . Shortness of breath 05/11/2017  . Hypothyroidism 05/11/2017  . Chronic diastolic HF (heart failure) ([place]) 05/11/2017  . Aortic valve stenosis 06/12/2016  . RBBB 06/12/2016  . PAD (peripheral artery disease) ([place]) 08/04/2012  . Essential hypertension 08/04/2012  . Mixed hyperlipidemia 08/04/2012  . Dizziness 08/04/2012    Past Surgical History:  Procedure Laterality Date  . APPENDECTOMY  1943  . CATARACT EXTRACTION  2012   x2  . COLONOSCOPY N/A 11/01/2014   Procedure: COLONOSCOPY;  Surgeon: Aviva Signs, MD;  Location: [facility];  Service: Gastroenterology;  Laterality: N/A;  . ESOPHAGOGASTRODUODENOSCOPY N/A 11/01/2014   Procedure: ESOPHAGOGASTRODUODENOSCOPY (EGD);   Surgeon: Aviva Signs, MD;  Location: [facility];  Service: Gastroenterology;  Laterality: N/A;  . HERNIA REPAIR Left   . INGUINAL HERNIA REPAIR Right 03/01/2013   Procedure: RIGHT INGUINAL HERNIORRHAPHY;  Surgeon: Jamesetta So, MD;  Location: [facility] ORS;  Service: General;  Laterality: Right;  . INSERTION OF MESH Right 03/01/2013   Procedure: INSERTION OF MESH;  Surgeon: Jamesetta So, MD;  Location: [facility] ORS;  Service: General;  Laterality: Right;  . NM [place]  06/05/2010   no significant ischemia  . stent  06/14/2010   left leg  . US ECHOCARDIOGRAPHY  01/21/2006   moderate mitral annular ca+, mild MR, AOV moderately sclerotic.        Home Medications    Prior to Admission medications   Medication Sig Start Date End Date Taking? Authorizing Provider  apixaban (ELIQUIS) 2.5 MG TABS tablet Take 1 tablet (2.5 mg total) by mouth 2 (two) times daily. 08/11/17   Hilty, Nadean Corwin, MD  atorvastatin (LIPITOR) 40 MG tablet TAKE 1 TABLET BY MOUTH ONCE DAILY 02/17/18   Hilty, Nadean Corwin, MD  calcium carbonate (TUMS - DOSED IN MG ELEMENTAL CALCIUM) 500 MG chewable tablet Chew 1 tablet by mouth as needed for indigestion or heartburn.    [provider]  dorzolamide-timolol (COSOPT) 22.3-6.8 MG/ML ophthalmic solution Place 1 drop into both eyes 2 (two) times daily.    [provider]  doxycycline (VIBRA-TABS) 100 MG tablet Take 1 tablet (100 mg total) by  mouth 2 (two) times daily. 05/07/18   Wallene Huh, DPM  finasteride (PROSCAR) 5 MG tablet Take 5 mg by mouth daily.    [provider]  furosemide (LASIX) 40 MG tablet TAKE 1 TABLET(40 MG) BY MOUTH DAILY 12/01/17   Hilty, Nadean Corwin, MD  glipiZIDE (GLUCOTROL) 5 MG tablet Take 5 mg by mouth 2 (two) times daily.     [provider]  glucosamine-chondroitin 500-400 MG tablet Take 3 tablets by mouth 2 (two) times daily.     [provider]  levothyroxine (SYNTHROID, LEVOTHROID) 50 MCG tablet Take 1  tablet by mouth daily. 07/08/16   [provider]  lisinopril (PRINIVIL,ZESTRIL) 5 MG tablet Take 1 tablet (5 mg total) by mouth daily. 07/03/17   Hilty, Nadean Corwin, MD  Methylcellulose, Laxative, (FIBER THERAPY PO) Take by mouth daily.    [provider]  metoprolol tartrate (LOPRESSOR) 25 MG tablet Take 12.5 mg as needed for palpitations/heart racing 07/26/17   Barton Dubois, MD  Omega-3 Fatty Acids (FISH OIL PO) Take 1 capsule by mouth daily.    [provider]  pyridOXINE (VITAMIN B-6) 100 MG tablet Take 100 mg by mouth daily.    [provider]  ranitidine (ZANTAC) 150 MG tablet Take 150 mg by mouth at bedtime.    [provider]  tamsulosin (FLOMAX) 0.4 MG CAPS Take 0.4 mg by mouth 2 (two) times daily.     [provider]  vitamin C (ASCORBIC ACID) 500 MG tablet Take 500 mg by mouth daily.    [provider]  vitamin E 400 UNIT capsule Take 400 Units by mouth daily.    [provider]  zinc gluconate 50 MG tablet Take 50 mg by mouth daily.    [provider]    Family History Family History  Problem Relation Age of Onset  . Cancer Mother   . Heart attack Father   . Stroke Father   . Heart attack Brother   . Heart attack Brother     Social History Social History   Tobacco Use  . Smoking status: Former Smoker    Last attempt to quit: 01/14/1935    Years since quitting: 83.4  . Smokeless tobacco: Former Systems developer    Types: Chew  Substance Use Topics  . Alcohol use: No  . Drug use: No     Allergies   Iodinated diagnostic agents; Iodine-131; and Iohexol   Review of Systems Review of Systems ROS: Statement: All systems negative except as marked or noted in the HPI; Constitutional: Negative for fever and chills. ; ; Eyes: Negative for eye pain, redness and discharge. ; ; ENMT: Negative for ear pain, hoarseness, nasal congestion, sinus pressure and sore throat. ; ; Cardiovascular: Negative for chest  pain, palpitations, diaphoresis, dyspnea and peripheral edema. ; ; Respiratory: Negative for cough, wheezing and stridor. ; ; Gastrointestinal: Negative for nausea, vomiting, diarrhea, abdominal pain, blood in stool, hematemesis, jaundice and rectal bleeding. . ; ; Genitourinary: Negative for dysuria, flank pain and hematuria. ; ; Musculoskeletal: Negative for back pain and neck pain. Negative for swelling and trauma.; ; Skin: Negative for pruritus, abrasions, blisters, bruising and +rash, skin lesion.; ; Neuro: Negative for headache, lightheadedness and neck stiffness. Negative for weakness, altered level of consciousness, altered mental status, extremity weakness, paresthesias, involuntary movement, seizure and syncope.      Physical Exam Updated Vital Signs BP (!) 164/80 (BP Location: Right Arm)   Pulse 63   Temp  97.9 F (36.6 C) (Oral)   Resp 18   Ht 5' 11.5" (1.816 m)   Wt 102.1 kg   SpO2 100%   BMI 30.94 kg/m    Patient Vitals for the past 24 hrs:  BP Temp Temp src Pulse Resp SpO2 Height Weight  05/29/18 1000 (!) 153/63 - - (!) 52 12 99 % - -  05/29/18 0930 (!) 143/55 - - (!) 52 11 98 % - -  05/29/18 0900 (!) 171/66 - - (!) 54 14 100 % - -  05/29/18 0830 (!) 175/73 - - (!) 59 14 100 % - -  05/29/18 0825 (!) 164/80 97.9 F (36.6 C) Oral 63 18 100 % 5' 11.5" (1.816 m) 102.1 kg     Physical Exam 0840: Physical examination:  Nursing notes reviewed; Vital signs and O2 SAT reviewed;  Constitutional: Well developed, Well nourished, Well hydrated, In no acute distress; Head:  Normocephalic, atraumatic; Eyes: EOMI, PERRL, No scleral icterus; ENMT: Mouth and pharynx normal, Mucous membranes moist; Neck: Supple, Full range of motion, No lymphadenopathy; Cardiovascular: Regular rate and rhythm, No gallop; Respiratory: Breath sounds clear & equal bilaterally, No wheezes.  Speaking full sentences with ease, Normal respiratory effort/excursion; Chest: Nontender, Movement normal; Abdomen: Soft,  Nontender, Nondistended, Normal bowel sounds; Genitourinary: No CVA tenderness; Extremities: +shallow ulcer right great toe, toenail bed with necrotic tissue and scant purulent drainage, erythema extends from dorsal/plantar toes to dorsal/plantar forefoot with streaking up dorsal foot.  Peripheral pulses normal, No deformity. No edema, No calf edema or asymmetry.; Neuro: AA&Ox3, Major CN grossly intact.  Speech clear. No gross focal motor or sensory deficits in extremities.; Skin: Color normal, Warm, Dry.     ED Treatments / Results  Labs (all labs ordered are listed, but only abnormal results are displayed)   EKG None  Radiology   Procedures Procedures (including critical care time)  Medications Ordered in ED Medications  clindamycin (CLEOCIN) IVPB 600 mg (has no administration in time range)     Initial Impression / Assessment and Plan / ED Course  I have reviewed the triage vital signs and the nursing notes.  Pertinent labs & imaging results that were available during my care of the patient were reviewed by me and considered in my medical decision making (see chart for details).     MDM Reviewed: previous chart, nursing note and vitals Reviewed previous: labs Interpretation: labs and x-ray   Results for orders placed or performed during the hospital encounter of 05/29/18  Culture, blood (routine x 2)  Result Value Ref Range   Specimen Description BLOOD RIGHT ARM    Special Requests      BOTTLES DRAWN AEROBIC AND ANAEROBIC Blood Culture adequate volume Performed at [facility], [address], [place], [place] [address]    Culture PENDING    Report Status PENDING   Culture, blood (routine x 2)  Result Value Ref Range   Specimen Description BLOOD LEFT ARM    Special Requests      BOTTLES DRAWN AEROBIC AND ANAEROBIC Blood Culture adequate volume Performed at [facility], [address] [place], [place] [address]    Culture PENDING    Report Status PENDING    Basic metabolic panel  Result Value Ref Range   Sodium 135 135 - 145 mmol/L   Potassium 4.3 3.5 - 5.1 mmol/L   Chloride 99 98 - 111 mmol/L   CO2 26 22 - 32 mmol/L   Glucose, Bld 135 (H) 70 - 99  mg/dL   BUN 19 8 - 23 mg/dL   Creatinine, Ser 1.40 (H) 0.61 - 1.24 mg/dL   Calcium 9.2 8.9 - 10.3 mg/dL   GFR calc non Af Amer 45 (L) >60 mL/min   GFR calc Af Amer 52 (L) >60 mL/min   Anion gap 10 5 - 15  CBC with Differential  Result Value Ref Range   WBC 8.8 4.0 - 10.5 K/uL   RBC 3.69 (L) 4.22 - 5.81 MIL/uL   Hemoglobin 12.1 (L) 13.0 - 17.0 g/dL   HCT 37.3 (L) 39.0 - 52.0 %   MCV 101.1 (H) 80.0 - 100.0 fL   MCH 32.8 26.0 - 34.0 pg   MCHC 32.4 30.0 - 36.0 g/dL   RDW 12.7 11.5 - 15.5 %   Platelets 224 150 - 400 K/uL   nRBC 0.0 0.0 - 0.2 %   Neutrophils Relative % 55 %   Neutro Abs 4.8 1.7 - 7.7 K/uL   Lymphocytes Relative 31 %   Lymphs Abs 2.7 0.7 - 4.0 K/uL   Monocytes Relative 9 %   Monocytes Absolute 0.8 0.1 - 1.0 K/uL   Eosinophils Relative 4 %   Eosinophils Absolute 0.3 0.0 - 0.5 K/uL   Basophils Relative 1 %   Basophils Absolute 0.0 0.0 - 0.1 K/uL   Immature Granulocytes 0 %   Abs Immature Granulocytes 0.03 0.00 - 0.07 K/uL  Lactic acid, plasma  Result Value Ref Range   Lactic Acid, Venous 1.0 0.5 - 1.9 mmol/L   Dg Foot Complete Right Result Date: 05/29/2018 CLINICAL DATA:  Right great toe infection. EXAM: RIGHT FOOT COMPLETE - 3+ VIEW COMPARISON:  Radiographs of May 07, 2018. FINDINGS: There is no evidence of fracture or dislocation. No lytic destruction is seen to suggest osteomyelitis. There is no evidence of arthropathy or other focal bone abnormality. Vascular calcifications are noted. IMPRESSION: No acute abnormality seen in the right foot. Electronically Signed   By: Marijo Conception M.D.   On: 05/29/2018 09:26    Results for SHYHIEM, BEENEY (MRN [id number]) as of 05/29/2018 10:19  Ref. Range 07/25/2017 10:05 07/26/2017 07:18 05/29/2018 09:01  BUN Latest Ref Range: 8 -  23 mg/dL 17 21 19   Creatinine Latest Ref Range: 0.61 - 1.24 mg/dL 1.46 (H) 1.62 (H) 1.40 (H)    1015:  H/H per baseline. Lactic acid and WBC count normal. No fever while in the ED. IV clindamycin given after BC obtained. Will need further imaging study and admission for IV abx. T/C returned from [facility] Dyann Kief, case discussed, including:  HPI, pertinent PM/SHx, VS/PE, dx testing, ED course and treatment:  Agreeable to admit, he will place MRI orders.      Final Clinical Impressions(s) / ED Diagnoses   Final diagnoses:  None    ED Discharge Orders    None       Francine Graven, DO 06/01/18 1125

## 2018-05-29 NOTE — ED Notes (Signed)
Pt ambulating to rest room.

## 2018-05-29 NOTE — H&P (Signed)
History and Physical    [name] DOA: 05/29/2018  Referring MD/NP/PA: Dr. Thurnell Garbe PCP: [name], MD  Patient coming from: Home  Chief Complaint: Right great toe cellulitis with purulent discharge.  HPI: [name] is a 85 y.o. male with a past medical history significant for type 2 diabetes mellitus with nephropathy, chronic diastolic heart failure, hypertension, hyperlipidemia, hypothyroidism and paroxysmal atrial fibrillation on Eliquis; who presented to the hospital secondary to right great toe swelling, erythematous changes and purulent discharge.  Patient has been fired and ongoing paronychia and cellulitis for almost 3 weeks now.  As an outpatient he has his right great toe nail remove and has completed 2 rounds of oral antibiotics without resolution of the process.  Patient reported swelling in the dorsal aspect of his foot and is spreading of the redness now.  Right great toe with purulent discharge appreciated.  Patient denies chest pain, shortness of breath, fever, chills, nausea, vomiting, abdominal pain, hematuria, dysuria, focal weakness or any other complaints.    In ED x-rays and MRI of the fluid has rule out osteomyelitis, normal WBCs and currently afebrile.  Cultures taken and IV antibiotics started.  [facility] has been called to admit patient for further evaluation and management of ongoing right great toe cellulitis with purulent discharge that has failed oral antibiotic as an outpatient.   Past Medical/Surgical History: Past Medical History:  Diagnosis Date   Arthritis    Borderline diabetic    Glaucoma    Hyperlipidemia    Hypertension    PVD (peripheral vascular disease) ([place])    Sleep apnea    Cpap ordered but doesnt use    Past Surgical History:  Procedure Laterality Date   APPENDECTOMY  1943   CATARACT EXTRACTION  2012   x2   COLONOSCOPY N/A 11/01/2014   Procedure: COLONOSCOPY;  Surgeon: Aviva Signs, MD;   [facility]: [facility];  Service: Gastroenterology;  Laterality: N/A;   ESOPHAGOGASTRODUODENOSCOPY N/A 11/01/2014   Procedure: ESOPHAGOGASTRODUODENOSCOPY (EGD);  Surgeon: Aviva Signs, MD;  Location: [facility];  Service: Gastroenterology;  Laterality: N/A;   HERNIA REPAIR Left    INGUINAL HERNIA REPAIR Right 03/01/2013   Procedure: RIGHT INGUINAL HERNIORRHAPHY;  Surgeon: Jamesetta So, MD;  Location: [facility];  Service: General;  Laterality: Right;   INSERTION OF MESH Right 03/01/2013   Procedure: INSERTION OF MESH;  Surgeon: Jamesetta So, MD;  Location: [facility];  Service: General;  Laterality: Right;   NM MYOCAR PERF WALL MOTION  06/05/2010   no significant ischemia   stent  06/14/2010   left leg   US ECHOCARDIOGRAPHY  01/21/2006   moderate mitral annular ca+, mild MR, AOV moderately sclerotic.    Social History:  reports that he quit smoking about 83 years ago. He has quit using smokeless tobacco.  His smokeless tobacco use included chew. He reports that he does not drink alcohol or use drugs.  Allergies: Allergies  Allergen Reactions   Iodinated Diagnostic Agents     Other reaction(s): Fever   Iodine-131 Other (See Comments)    Breakout, fever   Iohexol      Code: RASH, Desc: PT STATES HOT ALL OVER HAD TO HAVE ICE PACKS ALL OVER HIM AND DIFF BREATHING, PT REFUSED IV DYE     Family History:  Family History  Problem Relation Age of Onset   Cancer Mother    Heart attack Father    Stroke Father    Heart  attack Brother    Heart attack Brother     Prior to Admission medications   Medication Sig Start Date End Date Taking? Authorizing Provider  apixaban (ELIQUIS) 2.5 MG TABS tablet Take 1 tablet (2.5 mg total) by mouth 2 (two) times daily. 08/11/17  Yes Hilty, Nadean Corwin, MD  atorvastatin (LIPITOR) 40 MG tablet TAKE 1 TABLET BY MOUTH ONCE DAILY 02/17/18  Yes Hilty, Nadean Corwin, MD  calcium carbonate (TUMS - DOSED IN MG ELEMENTAL CALCIUM) 500 MG chewable tablet Chew 1  tablet by mouth as needed for indigestion or heartburn.   Yes [provider]  dorzolamide-timolol (COSOPT) 22.3-6.8 MG/ML ophthalmic solution Place 1 drop into both eyes 2 (two) times daily.   Yes [provider]  finasteride (PROSCAR) 5 MG tablet Take 5 mg by mouth daily.   Yes [provider]  furosemide (LASIX) 40 MG tablet TAKE 1 TABLET(40 MG) BY MOUTH DAILY 12/01/17  Yes Hilty, Nadean Corwin, MD  glipiZIDE (GLUCOTROL) 5 MG tablet Take 5 mg by mouth 2 (two) times daily.    Yes [provider]  glucosamine-chondroitin 500-400 MG tablet Take 3 tablets by mouth 2 (two) times daily.    Yes [provider]  levothyroxine (SYNTHROID, LEVOTHROID) 50 MCG tablet Take 1 tablet by mouth daily. 07/08/16  Yes [provider]  lisinopril (PRINIVIL,ZESTRIL) 5 MG tablet Take 1 tablet (5 mg total) by mouth daily. 07/03/17  Yes Hilty, Nadean Corwin, MD  metoprolol tartrate (LOPRESSOR) 25 MG tablet Take 12.5 mg as needed for palpitations/heart racing 07/26/17  Yes Barton Dubois, MD  Omega-3 Fatty Acids (FISH OIL PO) Take 1 capsule by mouth daily.   Yes [provider]  pyridOXINE (VITAMIN B-6) 100 MG tablet Take 100 mg by mouth daily.   Yes [provider]  ranitidine (ZANTAC) 150 MG tablet Take 150 mg by mouth at bedtime.   Yes [provider]  tamsulosin (FLOMAX) 0.4 MG CAPS Take 0.4 mg by mouth 2 (two) times daily.    Yes [provider]  vitamin C (ASCORBIC ACID) 500 MG tablet Take 500 mg by mouth daily.   Yes [provider]  vitamin E 400 UNIT capsule Take 400 Units by mouth daily.   Yes [provider]  zinc gluconate 50 MG tablet Take 50 mg by mouth daily.   Yes [provider]  Methylcellulose, Laxative, (FIBER THERAPY PO) Take by mouth daily.    [provider]    Review of Systems:  Negative except as otherwise mentioned in HPI.  Physical Exam: Vitals:   05/29/18 0930 05/29/18 1000  05/29/18 1030 05/29/18 1204  BP: (!) 143/55 (!) 153/63 (!) 160/66 (!) 129/56  Pulse: (!) 52 (!) 52 (!) 49 63  Resp: 11 12 11 19   Temp:    97.6 F (36.4 C)  TempSrc:    Oral  SpO2: 98% 99% 99% 100%  Weight:      Height:       Constitutional: NAD, calm, comfortable; afebrile, denying chest pain, palpitations, shortness of breath, nausea, vomiting.  Reports pain and supination on his right foot. Eyes: PERRL, lids and conjunctivae normal, no icterus, no nystagmus. ENMT: Mucous membranes are moist. Posterior pharynx clear of any exudate or lesions. Hard of hearing. Neck: normal, supple, no masses, no thyromegaly, no JVD Respiratory: clear to auscultation bilaterally, no wheezing, no crackles. Normal respiratory effort. No accessory muscle use.  Cardiovascular: Regular rate and rhythm, no murmurs / rubs / gallops. No extremity edema.  No carotid bruits.  Abdomen: no tenderness, no masses palpated. No hepatosplenomegaly. Bowel sounds positive.  Musculoskeletal: no clubbing / cyanosis. No joint deformity upper and lower extremities. Good ROM, no contractures. Normal muscle tone.  Skin: Right foot with erythematous changes increased dose (dorsal aspect) appreciated swelling as part of pedal edema on examination and tenderness to palpation.  Right great toe without nail and crusted purulent discharge appreciated. Neurologic: CN 2-12 grossly intact. Sensation intact, DTR normal. Strength 5/5 in all 4.  Psychiatric: Normal judgment and insight. Alert and oriented x 3. Normal mood.    Labs on Admission: I have personally reviewed the following labs and imaging studies  CBC: Recent Labs  Lab 05/29/18 0901  WBC 8.8  NEUTROABS 4.8  HGB 12.1*  HCT 37.3*  MCV 101.1*  PLT 086   Basic Metabolic Panel: Recent Labs  Lab 05/29/18 0901  NA 135  K 4.3  CL 99  CO2 26  GLUCOSE 135*  BUN 19  CREATININE 1.40*  CALCIUM 9.2   GFR: Estimated Creatinine Clearance: 44.7 mL/min (A) (by C-G formula  based on SCr of 1.4 mg/dL (H)).  HbA1C: Recent Labs    05/29/18 1207  HGBA1C 7.0*   CBG: Recent Labs  Lab 05/29/18 1608  GLUCAP 128*   Thyroid Function Tests: Recent Labs    05/29/18 1207  TSH 3.572    Recent Results (from the past 240 hour(s))  Culture, blood (routine x 2)     Status: None (Preliminary result)   Collection Time: 05/29/18  9:01 AM  Result Value Ref Range Status   Specimen Description BLOOD RIGHT ARM  Final   Special Requests   Final    BOTTLES DRAWN AEROBIC AND ANAEROBIC Blood Culture adequate volume Performed at [facility], [address], [place], [place] [address]    Culture PENDING  Incomplete   Report Status PENDING  Incomplete  Culture, blood (routine x 2)     Status: None (Preliminary result)   Collection Time: 05/29/18  9:08 AM  Result Value Ref Range Status   Specimen Description BLOOD LEFT ARM  Final   Special Requests   Final    BOTTLES DRAWN AEROBIC AND ANAEROBIC Blood Culture adequate volume Performed at [facility], [address], [place], [place] [address]    Culture PENDING  Incomplete   Report Status PENDING  Incomplete  SARS Coronavirus 2 (CEPHEID - Performed in [facility] hospital lab), Hosp Order     Status: None   Collection Time: 05/29/18 10:03 AM  Result Value Ref Range Status   SARS Coronavirus 2 NEGATIVE NEGATIVE Final    Comment: (NOTE) If result is NEGATIVE SARS-CoV-2 target nucleic acids are NOT DETECTED. The SARS-CoV-2 RNA is generally detectable in upper and lower  respiratory specimens during the acute phase of infection. The lowest  concentration of SARS-CoV-2 viral copies this assay can detect is 250  copies / mL. A negative result does not preclude SARS-CoV-2 infection  and should not be used as the sole basis for treatment or other  patient management decisions.  A negative result may occur with  improper specimen collection / handling, submission of specimen other  than nasopharyngeal swab, presence of  viral mutation(s) within the  areas targeted by this assay, and inadequate number of viral copies  (<250 copies / mL). A negative result must be combined with clinical  observations, patient history, and epidemiological information. If result is POSITIVE SARS-CoV-2 target nucleic acids are DETECTED. The SARS-CoV-2 RNA is generally  detectable in upper and lower  respiratory specimens dur ing the acute phase of infection.  Positive  results are indicative of active infection with SARS-CoV-2.  Clinical  correlation with patient history and other diagnostic information is  necessary to determine patient infection status.  Positive results do  not rule out bacterial infection or co-infection with other viruses. If result is PRESUMPTIVE POSTIVE SARS-CoV-2 nucleic acids MAY BE PRESENT.   A presumptive positive result was obtained on the submitted specimen  and confirmed on repeat testing.  While 2019 novel coronavirus  (SARS-CoV-2) nucleic acids may be present in the submitted sample  additional confirmatory testing may be necessary for epidemiological  and / or clinical management purposes  to differentiate between  SARS-CoV-2 and other Sarbecovirus currently known to infect humans.  If clinically indicated additional testing with an alternate test  methodology [phone]) is advised. The SARS-CoV-2 RNA is generally  detectable in upper and lower respiratory sp ecimens during the acute  phase of infection. The expected result is Negative. Fact Sheet for Patients:  StrictlyIdeas.no Fact Sheet for Healthcare Providers: BankingDealers.co.za This test is not yet approved or cleared by the [place] FDA and has been authorized for detection and/or diagnosis of SARS-CoV-2 by FDA under an Emergency Use Authorization (EUA).  This EUA will remain in effect (meaning this test can be used) for the duration of the COVID-19 declaration under Section  564(b)(1) of the Act, 21 U.S.C. section 360bbb-3(b)(1), unless the authorization is terminated or revoked sooner. Performed at [facility], [address] [place], [place] [address]      Radiological Exams on Admission: Mr Toes Right Wo Contrast  Result Date: 05/29/2018 CLINICAL DATA:  Right great toe infection. EXAM: MRI OF THE RIGHT TOES WITHOUT CONTRAST TECHNIQUE: Multiplanar, multisequence MR imaging of the right forefoot was performed. No intravenous contrast was administered. COMPARISON:  Right foot x-rays from same day. FINDINGS: Bones/Joint/Cartilage No marrow signal abnormality. No fracture or dislocation. Normal alignment. No joint effusion. Ligaments Collateral ligaments are intact.  Lisfranc ligament is intact. Muscles and Tendons Flexor, peroneal and extensor compartment tendons are intact. Increased T2 signal within and atrophy of the intrinsic muscles of the forefoot, nonspecific, but likely related to diabetic muscle changes/denervation. Soft tissue Mild dorsal forefoot soft tissue swelling. No fluid collection or hematoma. No soft tissue mass. IMPRESSION: 1. No osteomyelitis or abscess. Electronically Signed   By: Titus Dubin M.D.   On: 05/29/2018 12:07   Dg Foot Complete Right  Result Date: 05/29/2018 CLINICAL DATA:  Right great toe infection. EXAM: RIGHT FOOT COMPLETE - 3+ VIEW COMPARISON:  Radiographs of May 07, 2018. FINDINGS: There is no evidence of fracture or dislocation. No lytic destruction is seen to suggest osteomyelitis. There is no evidence of arthropathy or other focal bone abnormality. Vascular calcifications are noted. IMPRESSION: No acute abnormality seen in the right foot. Electronically Signed   By: Marijo Conception M.D.   On: 05/29/2018 09:26    EKG: None  Assessment/Plan 1-right great toe cellulitis -Ongoing process for almost 3 weeks now and worsening. -X-rayed has demonstrated no osteomyelitis -MRI without abscess and suggesting just soft tissue  swelling without osteomyelitis. -Patient has been admitted for IV antibiotics and wound care examination -Will keep the leg elevated and provide pain medication as needed. -Follow-up blood cultures. -Patient afebrile and currently with normal WBCs.  2-essential hypertension -Blood pressure overall stable -Continue low-dose lisinopril  -holding lasix overnight -Heart healthy diet has been ordered.  3-hypothyroidism -Check TSH -  Continue Synthroid.  4-chronic diastolic heart failure -Stable and compensated -Follow daily weights and strict intake and output -Low-sodium diet has been encouraged -Continue lisinopril and resume lasix in am.  5-paroxysmal atrial fibrillation -ChadsVASC score 3 -Rate controlled and a stable (sinus rhythm in ED telemetry) -Continue apixaban for secondary prevention -Patient has not required the use of metoprolol lately.  6-history of BPH -Continue the use of Proscar and Flomax -No complaints of urinary retention.  7-type 2 diabetes -While inpatient will hold oral hypoglycemic agents -Check A1c -Patient started on sliding scale insulin.  8-hyperlipidemia -Continue statins.  9-Chronic kidney disease is stage III -Patient baseline creatinine 1.4-1.6 -Appears to be stable and at baseline. -Will monitor renal function trend to assure stability.   DVT prophylaxis: Eliquis Code Status: Full code Family Communication: No family at bedside Disposition Plan: Anticipate discharge back home once improvement demonstrated on right great toe cellulitic/infection process. Consults called: Wound care service. Admission status: Inpatient, length of state more than 2 midnights, MedSurg bed.   Time Spent: 70 minutes  Barton Dubois MD [facility] Pager [phone]  05/29/2018, 6:42 PM

## 2018-05-29 NOTE — ED Notes (Signed)
Discoloration to right great toe. Able to move

## 2018-05-29 NOTE — ED Triage Notes (Signed)
Pt sent over by [facility] for an infected RT great toe. Pt states he has had issues with that toe for 3 weeks. Pt reports he is a borderline diabetic. Denies fever.

## 2018-05-29 NOTE — ED Notes (Signed)
Pt to MRI

## 2018-05-30 DIAGNOSIS — E785 Hyperlipidemia, unspecified: Secondary | ICD-10-CM

## 2018-05-30 DIAGNOSIS — K219 Gastro-esophageal reflux disease without esophagitis: Secondary | ICD-10-CM

## 2018-05-30 DIAGNOSIS — E1169 Type 2 diabetes mellitus with other specified complication: Secondary | ICD-10-CM

## 2018-05-30 LAB — CBC
HCT: 34.4 % — ABNORMAL LOW (ref 39.0–52.0)
Hemoglobin: 11.4 g/dL — ABNORMAL LOW (ref 13.0–17.0)
MCH: 33 pg (ref 26.0–34.0)
MCHC: 33.1 g/dL (ref 30.0–36.0)
MCV: 99.7 fL (ref 80.0–100.0)
Platelets: 208 10*3/uL (ref 150–400)
RBC: 3.45 MIL/uL — ABNORMAL LOW (ref 4.22–5.81)
RDW: 13 % (ref 11.5–15.5)
WBC: 6.7 10*3/uL (ref 4.0–10.5)
nRBC: 0 % (ref 0.0–0.2)

## 2018-05-30 LAB — GLUCOSE, CAPILLARY
Glucose-Capillary: 138 mg/dL — ABNORMAL HIGH (ref 70–99)
Glucose-Capillary: 141 mg/dL — ABNORMAL HIGH (ref 70–99)
Glucose-Capillary: 125 mg/dL — ABNORMAL HIGH (ref 70–99)
Glucose-Capillary: 195 mg/dL — ABNORMAL HIGH (ref 70–99)

## 2018-05-30 LAB — BASIC METABOLIC PANEL
Anion gap: 9 (ref 5–15)
BUN: 21 mg/dL (ref 8–23)
CO2: 25 mmol/L (ref 22–32)
Calcium: 8.9 mg/dL (ref 8.9–10.3)
Chloride: 103 mmol/L (ref 98–111)
Creatinine, Ser: 1.33 mg/dL — ABNORMAL HIGH (ref 0.61–1.24)
GFR calc Af Amer: 55 mL/min — ABNORMAL LOW (ref >60)
GFR calc non Af Amer: 47 mL/min — ABNORMAL LOW (ref >60)
Glucose, Bld: 140 mg/dL — ABNORMAL HIGH (ref 70–99)
Potassium: 4 mmol/L (ref 3.5–5.1)
Sodium: 137 mmol/L (ref 135–145)

## 2018-05-30 NOTE — Progress Notes (Signed)
PROGRESS NOTE    [name]  XBM:[id number] DOB: [birth year] DOA: 05/29/2018 PCP: [name], MD     Brief Narrative:  85 y.o. male with a past medical history significant for type 2 diabetes mellitus with nephropathy, chronic diastolic heart failure, hypertension, hyperlipidemia, hypothyroidism and paroxysmal atrial fibrillation on Eliquis; who presented to the hospital secondary to right great toe swelling, erythematous changes and purulent discharge.  Patient has been fired and ongoing paronychia and cellulitis for almost 3 weeks now.  As an outpatient he has his right great toe nail remove and has completed 2 rounds of oral antibiotics without resolution of the process.  Patient reported swelling in the dorsal aspect of his foot and is spreading of the redness now.  Right great toe with purulent discharge appreciated.  Patient denies chest pain, shortness of breath, fever, chills, nausea, vomiting, abdominal pain, hematuria, dysuria, focal weakness or any other complaints.    Patient has been admitted for further evaluation and management of right great toe cellulitis with purulent discharge that has failed outpatient oral antibiotic therapy.  Assessment & Plan: 1-right great toe cellulitis -Improving on IV antibiotics -Continue leg elevated, continue as needed analgesics -Patient currently afebrile and denying nausea vomiting -X-rays and MRI negative for osteomyelitis or drainable abscess. -Wound care service has been consulted and will follow recommendations. -Hopefully home in the next 24-48 hours.  2-essential hypertension -Continue lisinopril and resume Lasix -Blood pressure is well controlled.  3-hypothyroidism  -TSH within normal limits -continue Synthroid  4-chronic diastolic heart failure -Stable and compensated -Continue daily weights and strict I's and O's -Low-sodium diet has been ordered -Continue lisinopril and Lasix.  5-paroxysmal atrial  fibrillation -Chadsvasc score of 3 -Continue Eliquis for secondary prevention. -Patient using as needed metoprolol as an outpatient for palpitations or tachycardia. -Rate has remained stable in sinus.  6-history of BPH -Continue Proscar and Flomax.  7-type 2 diabetes: With nephropathy. -Continue holding oral hypoglycemic agents while inpatient -Continue sliding scale insulin.  8-chronic kidney disease stage III -Patient baseline creatinine 1.4-1.6 -Continue to follow renal function trend to assure the stability. -Cr 1.33 currently  9-Hyperlipidemia -Continue statins.  DVT prophylaxis: continue Eliquis.  Code Status: Full Code. Family Communication: Daughter updated by phone.  Disposition Plan: continue IV antibiotics, continue PRN analgesics, keep leg elevated and follow clinical response.   Consultants:   None   Procedures:   See below for x-ray   Antimicrobials:  Anti-infectives (From admission, onward)   Start     Dose/Rate Route Frequency Ordered Stop   05/29/18 1400  clindamycin (CLEOCIN) IVPB 600 mg     600 mg 100 mL/hr over 30 Minutes Intravenous Every 8 hours 05/29/18 1031     05/29/18 0900  clindamycin (CLEOCIN) IVPB 600 mg     600 mg 100 mL/hr over 30 Minutes Intravenous  Once 05/29/18 0848 05/29/18 0937     Subjective: Afebrile, no CP, no SOB, no nausea, no vomiting. Reporting improvement in swelling and redness in his right foot.  Objective: Vitals:   05/29/18 2026 05/29/18 2117 05/30/18 0523 05/30/18 0827  BP:  (!) 157/65 (!) 141/56   Pulse: (!) 56 (!) 58 (!) 58   Resp: 16 18 19    Temp:  98.4 F (36.9 C) 97.8 F (36.6 C)   TempSrc:  Oral Oral   SpO2: 96% 95% 97% 98%  Weight:   100.5 kg   Height:        Intake/Output Summary (Last 24 hours) at 05/30/2018 1347  Last data filed at 05/30/2018 0300 Gross per 24 hour  Intake 1384.8 ml  Output --  Net 1384.8 ml   Filed Weights   05/29/18 0825 05/30/18 0523  Weight: 102.1 kg 100.5 kg     Examination: General exam: Alert, awake, oriented x 3, febrile, no chest pain, no shortness of breath.  Reports decreasing swelling and redness on his right foot. Respiratory system: Clear to auscultation. Respiratory effort normal. Cardiovascular system:RRR. No murmurs, rubs, gallops. Gastrointestinal system: Abdomen is nondistended, soft and nontender. No organomegaly or masses felt. Normal bowel sounds heard. Central nervous system: Alert and oriented. No focal neurological deficits. Extremities: No cyanosis or clubbing.  Right lower extremity with some improvement in pedal edema and also decreased erythematous changes.  There is dry up on the suppuration on right toe. Pain improved.  Psychiatry: Judgement and insight appear normal. Mood & affect appropriate.    Data Reviewed: I have personally reviewed following labs and imaging studies  CBC: Recent Labs  Lab 05/29/18 0901 05/30/18 0552  WBC 8.8 6.7  NEUTROABS 4.8  --   HGB 12.1* 11.4*  HCT 37.3* 34.4*  MCV 101.1* 99.7  PLT 224 073   Basic Metabolic Panel: Recent Labs  Lab 05/29/18 0901 05/30/18 0552  NA 135 137  K 4.3 4.0  CL 99 103  CO2 26 25  GLUCOSE 135* 140*  BUN 19 21  CREATININE 1.40* 1.33*  CALCIUM 9.2 8.9   GFR: Estimated Creatinine Clearance: 46.8 mL/min (A) (by C-G formula based on SCr of 1.33 mg/dL (H)).  HbA1C: Recent Labs    05/29/18 1207  HGBA1C 7.0*   CBG: Recent Labs  Lab 05/29/18 1608 05/29/18 2113 05/30/18 0708 05/30/18 1107  GLUCAP 128* 145* 138* 141*   Thyroid Function Tests: Recent Labs    05/29/18 1207  TSH 3.572    Recent Results (from the past 240 hour(s))  Culture, blood (routine x 2)     Status: None (Preliminary result)   Collection Time: 05/29/18  9:01 AM  Result Value Ref Range Status   Specimen Description BLOOD RIGHT ARM  Final   Special Requests   Final    BOTTLES DRAWN AEROBIC AND ANAEROBIC Blood Culture adequate volume   Culture   Final    NO GROWTH 1  DAY Performed at [facility], [address], [place], [place] [address]    Report Status PENDING  Incomplete  Culture, blood (routine x 2)     Status: None (Preliminary result)   Collection Time: 05/29/18  9:08 AM  Result Value Ref Range Status   Specimen Description BLOOD LEFT ARM  Final   Special Requests   Final    BOTTLES DRAWN AEROBIC AND ANAEROBIC Blood Culture adequate volume   Culture   Final    NO GROWTH 1 DAY Performed at [facility], [address], [place], [place] [address]    Report Status PENDING  Incomplete  SARS Coronavirus 2 (CEPHEID - Performed in [place] hospital lab), Hosp Order     Status: None   Collection Time: 05/29/18 10:03 AM  Result Value Ref Range Status   SARS Coronavirus 2 NEGATIVE NEGATIVE Final    Comment: (NOTE) If result is NEGATIVE SARS-CoV-2 target nucleic acids are NOT DETECTED. The SARS-CoV-2 RNA is generally detectable in upper and lower  respiratory specimens during the acute phase of infection. The lowest  concentration of SARS-CoV-2 viral copies this assay can detect is 250  copies / mL. A negative result does not  preclude SARS-CoV-2 infection  and should not be used as the sole basis for treatment or other  patient management decisions.  A negative result may occur with  improper specimen collection / handling, submission of specimen other  than nasopharyngeal swab, presence of viral mutation(s) within the  areas targeted by this assay, and inadequate number of viral copies  (<250 copies / mL). A negative result must be combined with clinical  observations, patient history, and epidemiological information. If result is POSITIVE SARS-CoV-2 target nucleic acids are DETECTED. The SARS-CoV-2 RNA is generally detectable in upper and lower  respiratory specimens dur ing the acute phase of infection.  Positive  results are indicative of active infection with SARS-CoV-2.  Clinical  correlation with patient history and other diagnostic  information is  necessary to determine patient infection status.  Positive results do  not rule out bacterial infection or co-infection with other viruses. If result is PRESUMPTIVE POSTIVE SARS-CoV-2 nucleic acids MAY BE PRESENT.   A presumptive positive result was obtained on the submitted specimen  and confirmed on repeat testing.  While 2019 novel coronavirus  (SARS-CoV-2) nucleic acids may be present in the submitted sample  additional confirmatory testing may be necessary for epidemiological  and / or clinical management purposes  to differentiate between  SARS-CoV-2 and other Sarbecovirus currently known to infect humans.  If clinically indicated additional testing with an alternate test  methodology [phone]) is advised. The SARS-CoV-2 RNA is generally  detectable in upper and lower respiratory sp ecimens during the acute  phase of infection. The expected result is Negative. Fact Sheet for Patients:  StrictlyIdeas.no Fact Sheet for Healthcare Providers: BankingDealers.co.za This test is not yet approved or cleared by the [place] FDA and has been authorized for detection and/or diagnosis of SARS-CoV-2 by FDA under an Emergency Use Authorization (EUA).  This EUA will remain in effect (meaning this test can be used) for the duration of the COVID-19 declaration under Section 564(b)(1) of the Act, 21 U.S.C. section 360bbb-3(b)(1), unless the authorization is terminated or revoked sooner. Performed at [facility], [address], [place], [place] [address]      Radiology Studies: Mr Toes Right Wo Contrast  Result Date: 05/29/2018 CLINICAL DATA:  Right great toe infection. EXAM: MRI OF THE RIGHT TOES WITHOUT CONTRAST TECHNIQUE: Multiplanar, multisequence MR imaging of the right forefoot was performed. No intravenous contrast was administered. COMPARISON:  Right foot x-rays from same day. FINDINGS: Bones/Joint/Cartilage No marrow  signal abnormality. No fracture or dislocation. Normal alignment. No joint effusion. Ligaments Collateral ligaments are intact.  Lisfranc ligament is intact. Muscles and Tendons Flexor, peroneal and extensor compartment tendons are intact. Increased T2 signal within and atrophy of the intrinsic muscles of the forefoot, nonspecific, but likely related to diabetic muscle changes/denervation. Soft tissue Mild dorsal forefoot soft tissue swelling. No fluid collection or hematoma. No soft tissue mass. IMPRESSION: 1. No osteomyelitis or abscess. Electronically Signed   By: Titus Dubin M.D.   On: 05/29/2018 12:07   Dg Foot Complete Right  Result Date: 05/29/2018 CLINICAL DATA:  Right great toe infection. EXAM: RIGHT FOOT COMPLETE - 3+ VIEW COMPARISON:  Radiographs of May 07, 2018. FINDINGS: There is no evidence of fracture or dislocation. No lytic destruction is seen to suggest osteomyelitis. There is no evidence of arthropathy or other focal bone abnormality. Vascular calcifications are noted. IMPRESSION: No acute abnormality seen in the right foot. Electronically Signed   By: Marijo Conception M.D.   On: 05/29/2018  09:26    Scheduled Meds:  apixaban  2.5 mg Oral BID   atorvastatin  40 mg Oral Daily   dorzolamide-timolol  1 drop Both Eyes BID   finasteride  5 mg Oral Daily   furosemide  40 mg Oral Daily   insulin aspart  0-5 Units Subcutaneous QHS   insulin aspart  0-9 Units Subcutaneous TID WC   insulin detemir  5 Units Subcutaneous QHS   levothyroxine  50 mcg Oral Daily   pantoprazole  40 mg Oral Daily   pyridOXINE  100 mg Oral Daily   tamsulosin  0.4 mg Oral QPC supper   Continuous Infusions:  clindamycin (CLEOCIN) IV 600 mg (05/30/18 0558)     LOS: 1 day    Time spent: 30 minutes   Barton Dubois, MD [facility] Pager [phone]  05/30/2018, 1:47 PM

## 2018-05-31 LAB — GLUCOSE, CAPILLARY
Glucose-Capillary: 147 mg/dL — ABNORMAL HIGH (ref 70–99)
Glucose-Capillary: 163 mg/dL — ABNORMAL HIGH (ref 70–99)
Glucose-Capillary: 159 mg/dL — ABNORMAL HIGH (ref 70–99)
Glucose-Capillary: 160 mg/dL — ABNORMAL HIGH (ref 70–99)

## 2018-05-31 NOTE — Consult Note (Signed)
[facility] Nurse wound consult note Reason for Consult: RGT full thickness wound.  He is s/p toenail removal by podiatry in [place]; recent onset erythema and drainage. On systemic antibiotics. Wound type:Infectious Pressure Injury POA: /A Measurement: RGT with anterior dried serum and medial (interdigital) full thickness skin loss Wound bed:red, moist Drainage (amount, consistency, odor) small amount light yellow Periwound: resolving erythema Dressing procedure/placement/frequency: I will add a daily dressing consisting of xeroform gauze topped with dry gauze 2x2 and secured with conform.   Consider [facility] referral for a few visits/week for a few weeks until wound heals. If you agree, please order/arrange with Case Management . [facility] nursing team will not follow, but will remain available to this patient, the nursing and medical teams.  Please re-consult if needed. Thanks, Maudie Flakes, MSN, RN, [place], Arther Abbott  Pager# [phone]

## 2018-05-31 NOTE — Progress Notes (Signed)
PROGRESS NOTE    [name]  TOI:[id number] DOB: [birth year] DOA: 05/29/2018 PCP: [name], MD     Brief Narrative:  85 y.o. male with a past medical history significant for type 2 diabetes mellitus with nephropathy, chronic diastolic heart failure, hypertension, hyperlipidemia, hypothyroidism and paroxysmal atrial fibrillation on Eliquis; who presented to the hospital secondary to right great toe swelling, erythematous changes and purulent discharge.  Patient has been fired and ongoing paronychia and cellulitis for almost 3 weeks now.  As an outpatient he has his right great toe nail remove and has completed 2 rounds of oral antibiotics without resolution of the process.  Patient reported swelling in the dorsal aspect of his foot and is spreading of the redness now.  Right great toe with purulent discharge appreciated.  Patient denies chest pain, shortness of breath, fever, chills, nausea, vomiting, abdominal pain, hematuria, dysuria, focal weakness or any other complaints.    Patient has been admitted for further evaluation and management of right great toe cellulitis with purulent discharge that has failed outpatient oral antibiotic therapy.  Assessment & Plan: 1-right great toe cellulitis -Improving on IV antibiotics, will treat for another 24 hours and will follow recommendations by wound care service (still pending). -Continue leg elevated, continue as needed analgesics -Patient currently afebrile and denying nausea vomiting -X-rays and MRI negative for osteomyelitis or drainable abscess. -Hopefully home in the next 24 hours.  2-essential hypertension -Continue lisinopril and resume Lasix -Blood pressure is well controlled.  3-hypothyroidism  -TSH within normal limits -continue Synthroid  4-chronic diastolic heart failure -Stable and compensated -Continue daily weights and strict I's and O's -Low-sodium diet has been ordered -Continue lisinopril and Lasix.   5-paroxysmal atrial fibrillation -Chadsvasc score of 3 -Continue Eliquis for secondary prevention. -Patient using as needed metoprolol as an outpatient for palpitations or tachycardia. -Rate has remained stable in sinus.  6-history of BPH -Continue Proscar and Flomax.  7-type 2 diabetes: With nephropathy. -Continue holding oral hypoglycemic agents while inpatient -Continue sliding scale insulin.  8-chronic kidney disease stage III -Patient baseline creatinine 1.4-1.6 -Continue to follow renal function trend to assure the stability. -Cr 1.33 currently  9-Hyperlipidemia -Continue statins.  DVT prophylaxis: continue Eliquis.  Code Status: Full Code. Family Communication: Daughter updated by phone.  Disposition Plan: continue IV antibiotics, continue PRN analgesics, keep leg elevated and follow clinical response. Most likely discharge in am with oral abx's and instructions from wound care service to take care of his foot.   Consultants:   Wound care service  Procedures:   See below for x-ray   Antimicrobials:  Anti-infectives (From admission, onward)   Start     Dose/Rate Route Frequency Ordered Stop   05/29/18 1400  clindamycin (CLEOCIN) IVPB 600 mg     600 mg 100 mL/hr over 30 Minutes Intravenous Every 8 hours 05/29/18 1031     05/29/18 0900  clindamycin (CLEOCIN) IVPB 600 mg     600 mg 100 mL/hr over 30 Minutes Intravenous  Once 05/29/18 0848 05/29/18 0937     Subjective: No chest pain, no shortness of breath, no nausea, no vomiting.  Patient is afebrile.  Right foot with improvement in erythema, swelling and suppuration.  Still having some pain.  Objective: Vitals:   05/31/18 0620 05/31/18 0630 05/31/18 0735 05/31/18 1429  BP: (!) 135/48   (!) 140/44  Pulse: (!) 55   (!) 59  Resp: 20   18  Temp: 97.6 F (36.4 C)     TempSrc:  Oral     SpO2: 93%  95% 98%  Weight:  98.7 kg    Height:        Intake/Output Summary (Last 24 hours) at 05/31/2018 1632 Last data  filed at 05/31/2018 0616 Gross per 24 hour  Intake 390 ml  Output 550 ml  Net -160 ml   Filed Weights   05/29/18 0825 05/30/18 0523 05/31/18 0630  Weight: 102.1 kg 100.5 kg 98.7 kg    Examination: General exam: Alert, awake, oriented x 3; no fever, no chest pain, no shortness of breath.  Patient reports no nausea no vomiting.  Still having some on swelling and erythema on his right foot, but has noticed improvement in the amount of suppuration. Respiratory system: Clear to auscultation. Respiratory effort normal. Cardiovascular system:RRR. No murmurs, rubs, gallops. Gastrointestinal system: Abdomen is nondistended, soft and nontender. No organomegaly or masses felt. Normal bowel sounds heard. Central nervous system: Alert and oriented. No focal neurological deficits. Extremities: No cyanosis or clubbing; right lower extremity with improvement in pedal edema and also decrease in erythematous changes.  Still with small amount of light yellow suppuration (even significantly improved).  Skin: No rashes, lesions or ulcers Psychiatry: Judgement and insight appear normal. Mood & affect appropriate.   Data Reviewed: I have personally reviewed following labs and imaging studies  CBC: Recent Labs  Lab 05/29/18 0901 05/30/18 0552  WBC 8.8 6.7  NEUTROABS 4.8  --   HGB 12.1* 11.4*  HCT 37.3* 34.4*  MCV 101.1* 99.7  PLT 224 401   Basic Metabolic Panel: Recent Labs  Lab 05/29/18 0901 05/30/18 0552  NA 135 137  K 4.3 4.0  CL 99 103  CO2 26 25  GLUCOSE 135* 140*  BUN 19 21  CREATININE 1.40* 1.33*  CALCIUM 9.2 8.9   GFR: Estimated Creatinine Clearance: 46.4 mL/min (A) (by C-G formula based on SCr of 1.33 mg/dL (H)).  HbA1C: Recent Labs    05/29/18 1207  HGBA1C 7.0*   CBG: Recent Labs  Lab 05/30/18 1107 05/30/18 1642 05/30/18 2153 05/31/18 0741 05/31/18 1143  GLUCAP 141* 125* 195* 147* 163*   Thyroid Function Tests: Recent Labs    05/29/18 1207  TSH 3.572     Recent Results (from the past 240 hour(s))  Culture, blood (routine x 2)     Status: None (Preliminary result)   Collection Time: 05/29/18  9:01 AM  Result Value Ref Range Status   Specimen Description BLOOD RIGHT ARM  Final   Special Requests   Final    BOTTLES DRAWN AEROBIC AND ANAEROBIC Blood Culture adequate volume   Culture   Final    NO GROWTH 2 DAYS Performed at [facility], [address] [place], [place] [address]    Report Status PENDING  Incomplete  Culture, blood (routine x 2)     Status: None (Preliminary result)   Collection Time: 05/29/18  9:08 AM  Result Value Ref Range Status   Specimen Description BLOOD LEFT ARM  Final   Special Requests   Final    BOTTLES DRAWN AEROBIC AND ANAEROBIC Blood Culture adequate volume   Culture   Final    NO GROWTH 2 DAYS Performed at [facility], [address] [place], [place] [address]    Report Status PENDING  Incomplete  SARS Coronavirus 2 (CEPHEID - Performed in [place] hospital lab), Hosp Order     Status: None   Collection Time: 05/29/18 10:03 AM  Result Value Ref Range Status  SARS Coronavirus 2 NEGATIVE NEGATIVE Final    Comment: (NOTE) If result is NEGATIVE SARS-CoV-2 target nucleic acids are NOT DETECTED. The SARS-CoV-2 RNA is generally detectable in upper and lower  respiratory specimens during the acute phase of infection. The lowest  concentration of SARS-CoV-2 viral copies this assay can detect is 250  copies / mL. A negative result does not preclude SARS-CoV-2 infection  and should not be used as the sole basis for treatment or other  patient management decisions.  A negative result may occur with  improper specimen collection / handling, submission of specimen other  than nasopharyngeal swab, presence of viral mutation(s) within the  areas targeted by this assay, and inadequate number of viral copies  (<250 copies / mL). A negative result must be combined with clinical  observations, patient history, and  epidemiological information. If result is POSITIVE SARS-CoV-2 target nucleic acids are DETECTED. The SARS-CoV-2 RNA is generally detectable in upper and lower  respiratory specimens dur ing the acute phase of infection.  Positive  results are indicative of active infection with SARS-CoV-2.  Clinical  correlation with patient history and other diagnostic information is  necessary to determine patient infection status.  Positive results do  not rule out bacterial infection or co-infection with other viruses. If result is PRESUMPTIVE POSTIVE SARS-CoV-2 nucleic acids MAY BE PRESENT.   A presumptive positive result was obtained on the submitted specimen  and confirmed on repeat testing.  While 2019 novel coronavirus  (SARS-CoV-2) nucleic acids may be present in the submitted sample  additional confirmatory testing may be necessary for epidemiological  and / or clinical management purposes  to differentiate between  SARS-CoV-2 and other Sarbecovirus currently known to infect humans.  If clinically indicated additional testing with an alternate test  methodology [phone]) is advised. The SARS-CoV-2 RNA is generally  detectable in upper and lower respiratory sp ecimens during the acute  phase of infection. The expected result is Negative. Fact Sheet for Patients:  StrictlyIdeas.no Fact Sheet for Healthcare Providers: BankingDealers.co.za This test is not yet approved or cleared by the [place] FDA and has been authorized for detection and/or diagnosis of SARS-CoV-2 by FDA under an Emergency Use Authorization (EUA).  This EUA will remain in effect (meaning this test can be used) for the duration of the COVID-19 declaration under Section 564(b)(1) of the Act, 21 U.S.C. section 360bbb-3(b)(1), unless the authorization is terminated or revoked sooner. Performed at [facility], [address], [place], [place] [address]      Radiology  Studies: No results found.  Scheduled Meds: . apixaban  2.5 mg Oral BID  . atorvastatin  40 mg Oral Daily  . dorzolamide-timolol  1 drop Both Eyes BID  . finasteride  5 mg Oral Daily  . furosemide  40 mg Oral Daily  . insulin aspart  0-5 Units Subcutaneous QHS  . insulin aspart  0-9 Units Subcutaneous TID WC  . insulin detemir  5 Units Subcutaneous QHS  . levothyroxine  50 mcg Oral Daily  . pantoprazole  40 mg Oral Daily  . pyridOXINE  100 mg Oral Daily  . tamsulosin  0.4 mg Oral QPC supper   Continuous Infusions: . clindamycin (CLEOCIN) IV 600 mg (05/31/18 1620)     LOS: 2 days    Time spent: 30 minutes   Barton Dubois, MD [facility] Pager [phone]  05/31/2018, 4:32 PM

## 2018-06-01 DIAGNOSIS — K219 Gastro-esophageal reflux disease without esophagitis: Secondary | ICD-10-CM

## 2018-06-01 DIAGNOSIS — L97509 Non-pressure chronic ulcer of other part of unspecified foot with unspecified severity: Secondary | ICD-10-CM

## 2018-06-01 DIAGNOSIS — E11621 Type 2 diabetes mellitus with foot ulcer: Principal | ICD-10-CM

## 2018-06-01 DIAGNOSIS — I482 Chronic atrial fibrillation, unspecified: Secondary | ICD-10-CM

## 2018-06-01 LAB — CBC
HCT: 33.5 % — ABNORMAL LOW (ref 39.0–52.0)
Hemoglobin: 10.9 g/dL — ABNORMAL LOW (ref 13.0–17.0)
MCH: 32.7 pg (ref 26.0–34.0)
MCHC: 32.5 g/dL (ref 30.0–36.0)
MCV: 100.6 fL — ABNORMAL HIGH (ref 80.0–100.0)
Platelets: 209 10*3/uL (ref 150–400)
RBC: 3.33 MIL/uL — ABNORMAL LOW (ref 4.22–5.81)
RDW: 12.8 % (ref 11.5–15.5)
WBC: 7.9 10*3/uL (ref 4.0–10.5)
nRBC: 0 % (ref 0.0–0.2)

## 2018-06-01 LAB — GLUCOSE, CAPILLARY
Glucose-Capillary: 146 mg/dL — ABNORMAL HIGH (ref 70–99)
Glucose-Capillary: 170 mg/dL — ABNORMAL HIGH (ref 70–99)

## 2018-06-01 MED ORDER — CLINDAMYCIN HCL 300 MG PO CAPS: 300.0000 mg | ORAL_CAPSULE | Freq: Three times a day (TID) | ORAL | 0 refills | Status: AC

## 2018-06-01 NOTE — Progress Notes (Signed)
Discharge instructions given on medications and follow up visits,patient and daughter verbalized understanding. Prescriptions sent to Pharmacy of choice documented AVS. Accompanied by staff to an awaiting vehicle.

## 2018-06-01 NOTE — Care Management Important Message (Signed)
Important Message  Patient Details  Name: [name] MRN: [id number] Date of Birth: 07-11-1927   Medicare Important Message Given:  Yes    [name] 06/01/2018, 3:29 PM

## 2018-06-01 NOTE — TOC Initial Note (Signed)
Transition of Care [facility]) - Initial/Assessment Note    Patient Details  Name: [name] MRN: [id number] Date of Birth: 10/28/1927  Transition of Care [facility]) CM/SW Contact:    Ihor Gully, LCSW Phone Number: 06/01/2018, 2:36 PM  Clinical Narrative:                 Patient deferred to his daughter to make decisions. At baseline, patient ambulates with a cane, drives and is independent. Family is agreeable to [facility]. [place] referral made and PCP follow up appointment made.  LCSW signing off.   Expected Discharge Plan: [place] Barriers to Discharge: No Barriers Identified   Patient Goals and CMS Choice Patient states their goals for this hospitalization and ongoing recovery are:: To return home independent.  CMS Medicare.gov Compare Post Acute Care list provided to:: Patient Choice offered to / list presented to : Adult Children  Expected Discharge Plan and Services Expected Discharge Plan: [place]   Discharge Planning Services: CM Consult   Living arrangements for the past 2 months: Single Family Home Expected Discharge Date: 06/01/18                         HH Arranged: RN [place] Agency: [place] ([place]) Date [place]: 06/01/18 Time [place]: [id number] Representative spoke with at [place]: Romualdo Bolk, RN   Prior Living Arrangements/Services Living arrangements for the past 2 months: [place] with:: Self Patient language and need for interpreter reviewed:: No Do you feel safe going back to the place where you live?: Yes      Need for Family Participation in Patient Care: Yes (Comment) Care giver support system in place?: Yes (comment) Current home services: DME(Cane) Criminal Activity/Legal Involvement Pertinent to Current Situation/Hospitalization: No - Comment as needed  Activities of Daily Living Home Assistive Devices/Equipment: None ADL Screening (condition at time of  admission) Patient's cognitive ability adequate to safely complete daily activities?: Yes Is the patient deaf or have difficulty hearing?: No Does the patient have difficulty seeing, even when wearing glasses/contacts?: No Does the patient have difficulty concentrating, remembering, or making decisions?: No Patient able to express need for assistance with ADLs?: No Does the patient have difficulty dressing or bathing?: No Independently performs ADLs?: Yes (appropriate for developmental age) Does the patient have difficulty walking or climbing stairs?: Yes Weakness of Legs: Both Weakness of Arms/Hands: None  Permission Sought/Granted Permission sought to share information with : Family Supports, PCP       Permission granted to share info w AGENCY: Dr. Delanna Ahmadi office   Permission granted to share info w Relationship: Daughter, [name]      Emotional Assessment Appearance:: Appears stated age   Affect (typically observed): Appropriate Orientation: : Oriented to Self, Oriented to Place, Oriented to Situation, Oriented to  Time Alcohol / Substance Use: Not Applicable Psych Involvement: No (comment)  Admission diagnosis:  Cellulitis of great toe of right foot [L03.031] Cellulitis of right foot [L03.115] Patient Active Problem List   Diagnosis Date Noted  . Type 2 diabetes mellitus with foot ulcer, without long-term current use of insulin ([place])   . Gastroesophageal reflux disease   . Cellulitis of great toe of right foot 05/29/2018  . Atrial fibrillation, chronic   . Chest pain 07/25/2017  . PAF (paroxysmal atrial fibrillation) ([place]) 07/25/2017  . BPH (benign prostatic hyperplasia) 07/25/2017  . Shortness of breath 05/11/2017  .  Hypothyroidism 05/11/2017  . Chronic diastolic CHF (congestive heart failure) ([place]) 05/11/2017  . Aortic valve stenosis 06/12/2016  . RBBB 06/12/2016  . PAD (peripheral artery disease) ([place]) 08/04/2012  . Essential hypertension 08/04/2012  .  Hyperlipidemia 08/04/2012  . Dizziness 08/04/2012   PCP:  [name], MD Pharmacy:   [facility] [place], [place] [address] [place] [address] [place] [address] Phone: [phone] Fax: [phone]     Social Determinants of Health (SDOH) Interventions    Readmission Risk Interventions No flowsheet data found.

## 2018-06-01 NOTE — TOC Transition Note (Signed)
Transition of Care [facility]) - CM/SW Discharge Note   Patient Details  Name: ROVERTO BODMER MRN: [id number] Date of Birth: 03-Dec-1927  Transition of Care [facility]) CM/SW Contact:  Ihor Gully, LCSW Phone Number: 06/01/2018, 2:38 PM   Clinical Narrative:    Home with [place]. Daughter agreeable and aware.    Final next level of care: [place] Barriers to Discharge: No Barriers Identified   Patient Goals and CMS Choice Patient states their goals for this hospitalization and ongoing recovery are:: To return home independent.  CMS Medicare.gov Compare Post Acute Care list provided to:: Patient Choice offered to / list presented to : Adult Children  Discharge Placement                       Discharge Plan and Services   Discharge Planning Services: CM Consult                      HH Arranged: RN [facility] Agency: [place] ([place]) Date [name]: 06/01/18 Time [place]: [place] Representative spoke with at [place]: Romualdo Bolk, RN   Social Determinants of Health ([place]) Interventions     Readmission Risk Interventions No flowsheet data found.

## 2018-06-01 NOTE — Plan of Care (Signed)
  Problem: Education: Goal: Knowledge of General Education information will improve Description Including pain rating scale, medication(s)/side effects and non-pharmacologic comfort measures Outcome: Adequate for Discharge   Problem: Clinical Measurements: Goal: Ability to maintain clinical measurements within normal limits will improve Outcome: Adequate for Discharge Goal: Will remain free from infection Outcome: Adequate for Discharge Goal: Diagnostic test results will improve Outcome: Adequate for Discharge Goal: Respiratory complications will improve Outcome: Adequate for Discharge Goal: Cardiovascular complication will be avoided Outcome: Adequate for Discharge   Problem: Activity: Goal: Risk for activity intolerance will decrease Outcome: Adequate for Discharge   Problem: Pain Managment: Goal: General experience of comfort will improve Outcome: Adequate for Discharge   Problem: Safety: Goal: Ability to remain free from injury will improve Outcome: Adequate for Discharge   Problem: Skin Integrity: Goal: Risk for impaired skin integrity will decrease Outcome: Adequate for Discharge

## 2018-06-01 NOTE — Discharge Summary (Signed)
Physician Discharge Summary  [name] IWL:[id number] DOB: 07/30/1927 DOA: 05/29/2018  PCP: [name], MD  Admit date: 05/29/2018 Discharge date: 06/01/2018  Time spent: 35 minutes  Recommendations for Outpatient Follow-up:  1. Repeat basic metabolic panel to follow electrolytes and renal function 2. Assess patient via fluid wound/cellulitis to assure complete resolution of infection.  Discharge Diagnoses:  Active Problems:   Hyperlipidemia   Chronic diastolic CHF (congestive heart failure) (HCC)   Atrial fibrillation, chronic   Cellulitis of great toe of right foot   Type 2 diabetes mellitus with foot ulcer, without long-term current use of insulin (HCC)   Gastroesophageal reflux disease   Discharge Condition: Stable and improved.  Patient discharged home with instructions to follow-up with PCP in 2 weeks.  Home health nurse to continue following wound status has been arranged at discharge.  Diet recommendation:   Filed Weights   05/30/18 0523 05/31/18 0630 06/01/18 0513  Weight: 100.5 kg 98.7 kg 101.1 kg    History of present illness:  84 y.o.malewith a past medical history significant for type 2 diabetes mellitus with nephropathy, chronic diastolic heart failure, hypertension, hyperlipidemia, hypothyroidism and paroxysmal atrial fibrillation on Eliquis; who presented to the hospital secondary to right great toe swelling, erythematous changes and purulent discharge. Patient has been fired and ongoing paronychia and cellulitis for almost 3 weeks now. As an outpatient he has his right great toe nail remove and has completed 2 rounds of oral antibiotics without resolution of the process. Patient reported swelling in the dorsal aspect of his foot and is spreading of the redness now. Right great toe with purulent discharge appreciated. Patient denies chest pain, shortness of breath, fever, chills, nausea, vomiting, abdominal pain, hematuria, dysuria, focal weakness or any  other complaints.   Patient has been admitted for further evaluation and management of right great toe cellulitis with purulent discharge that has failed outpatient oral antibiotic therapy.  Hospital Course:  1-right great toe cellulitis -Improved and stable to transition to oral antibiotics. (Cleocin TID for 7 more days at discharge). -Continue leg elevated as much as possible, continue wound care rec's and dressing as per [place] service advice. -Patient currently afebrile and denying nausea vomiting -X-rays and MRI negative for osteomyelitis or drainable abscess. -outpatient follow up of his wound by wound care RN.  2-essential hypertension -Continue lisinopril and resume Lasix -Blood pressure is well controlled.  3-hypothyroidism  -TSH within normal limits -continue Synthroid  4-chronic diastolic heart failure -Stable and compensated -Continue daily weights and low sodium diet. -Continue lisinopril and Lasix.  5-chronic paroxysmal atrial fibrillation -Chadsvasc score of 3 -Continue Eliquis for secondary prevention. -Patient using as needed metoprolol as an outpatient for palpitations/tachycardia. -Rate has remained stable and in sinus.  6-history of BPH -Continue Proscar and Flomax. -No symptoms of urinary retention.  7-type 2 diabetes: With nephropathy. -Resume home oral hypoglycemic regimen. -Advised to follow modified carbohydrate diet.  8-chronic kidney disease stage III -Patient baseline creatinine 1.4-1.6 -Continue to follow renal function trend to assure the stability. -Cr 1.33 currently  9-Hyperlipidemia -Continue statins.  Procedures:  See below for x-ray reports.  Consultations:  Wound care service  Discharge Exam: Vitals:   06/01/18 0813 06/01/18 1315  BP:  139/61  Pulse:  (!) 57  Resp:  18  Temp:    SpO2: 96% 99%   General exam: Alert, awake, oriented x 3; no fever, no chest pain, no shortness of breath.  Patient reports no nausea  no vomiting.  Still having  some mild swelling and erythema on his right foot, but has noticed improvement and is eager to go home with [facility]. Respiratory system: Clear to auscultation. Respiratory effort normal. Cardiovascular system:RRR. No murmurs, rubs, gallops. Gastrointestinal system: Abdomen is nondistended, soft and nontender. No organomegaly or masses felt. Normal bowel sounds heard. Central nervous system: Alert and oriented. No focal neurological deficits. Extremities: No cyanosis or clubbing; right lower extremity with improvement in pedal edema and also decrease in erythematous changes.  Still with small amount of light yellow suppuration (even significantly improved).  Skin: No rashes, lesions or ulcers Psychiatry: Judgement and insight appear normal. Mood & affect appropriate.    Discharge Instructions   Discharge Instructions    (HEART FAILURE PATIENTS) Call MD:  Anytime you have any of the following symptoms: 1) 3 pound weight gain in 24 hours or 5 pounds in 1 week 2) shortness of breath, with or without a dry hacking cough 3) swelling in the hands, feet or stomach 4) if you have to sleep on extra pillows at night in order to breathe.   Complete by:  As directed    Diet - low sodium heart healthy   Complete by:  As directed    Discharge instructions   Complete by:  As directed    Take medications as prescribed Maintain adequate hydration Follow heart healthy diet and check your weight on daily basis. Follow instructions and recommendations by wound care Nurse Take antibiotics with food to minimize GI symptoms.   Increase activity slowly   Complete by:  As directed      Allergies as of 06/01/2018      Reactions   Iodinated Diagnostic Agents    Other reaction(s): Fever   Iodine-131 Other (See Comments)   Breakout, fever   Iohexol     Code: RASH, Desc: PT STATES HOT ALL OVER HAD TO HAVE ICE PACKS ALL OVER HIM AND DIFF BREATHING, PT REFUSED IV DYE      Medication List     TAKE these medications   apixaban 2.5 MG Tabs tablet Commonly known as:  ELIQUIS Take 1 tablet (2.5 mg total) by mouth 2 (two) times daily.   atorvastatin 40 MG tablet Commonly known as:  LIPITOR TAKE 1 TABLET BY MOUTH ONCE DAILY   calcium carbonate 500 MG chewable tablet Commonly known as:  TUMS - dosed in mg elemental calcium Chew 1 tablet by mouth as needed for indigestion or heartburn.   clindamycin 300 MG capsule Commonly known as:  CLEOCIN Take 1 capsule (300 mg total) by mouth 3 (three) times daily for 7 days.   dorzolamide-timolol 22.3-6.8 MG/ML ophthalmic solution Commonly known as:  COSOPT Place 1 drop into both eyes 2 (two) times daily.   FIBER THERAPY PO Take by mouth daily.   finasteride 5 MG tablet Commonly known as:  PROSCAR Take 5 mg by mouth daily.   FISH OIL PO Take 1 capsule by mouth daily.   furosemide 40 MG tablet Commonly known as:  LASIX TAKE 1 TABLET(40 MG) BY MOUTH DAILY   glipiZIDE 5 MG tablet Commonly known as:  GLUCOTROL Take 5 mg by mouth 2 (two) times daily.   glucosamine-chondroitin 500-400 MG tablet Take 3 tablets by mouth 2 (two) times daily.   levothyroxine 50 MCG tablet Commonly known as:  SYNTHROID Take 1 tablet by mouth daily.   lisinopril 5 MG tablet Commonly known as:  ZESTRIL Take 1 tablet (5 mg total) by mouth daily.   metoprolol tartrate 25  MG tablet Commonly known as:  LOPRESSOR Take 12.5 mg as needed for palpitations/heart racing   pyridOXINE 100 MG tablet Commonly known as:  VITAMIN B-6 Take 100 mg by mouth daily.   ranitidine 150 MG tablet Commonly known as:  ZANTAC Take 150 mg by mouth at bedtime.   tamsulosin 0.4 MG Caps capsule Commonly known as:  FLOMAX Take 0.4 mg by mouth 2 (two) times daily.   vitamin C 500 MG tablet Commonly known as:  ASCORBIC ACID Take 500 mg by mouth daily.   vitamin E 400 UNIT capsule Take 400 Units by mouth daily.   zinc gluconate 50 MG tablet Take 50 mg by mouth  daily.      Allergies  Allergen Reactions  . Iodinated Diagnostic Agents     Other reaction(s): Fever  . Iodine-131 Other (See Comments)    Breakout, fever  . Iohexol      Code: RASH, Desc: PT STATES HOT ALL OVER HAD TO HAVE ICE PACKS ALL OVER HIM AND DIFF BREATHING, PT REFUSED IV DYE    Follow-up Information    [name], MD. Schedule an appointment as soon as possible for a visit in 2 week(s).   Specialty:  Family Medicine Contact information: [address] [place] [address] [phone]           The results of significant diagnostics from this hospitalization (including imaging, microbiology, ancillary and laboratory) are listed below for reference.    Significant Diagnostic Studies: Mr Toes Right Wo Contrast  Result Date: 05/29/2018 CLINICAL DATA:  Right great toe infection. EXAM: MRI OF THE RIGHT TOES WITHOUT CONTRAST TECHNIQUE: Multiplanar, multisequence MR imaging of the right forefoot was performed. No intravenous contrast was administered. COMPARISON:  Right foot x-rays from same day. FINDINGS: Bones/Joint/Cartilage No marrow signal abnormality. No fracture or dislocation. Normal alignment. No joint effusion. Ligaments Collateral ligaments are intact.  Lisfranc ligament is intact. Muscles and Tendons Flexor, peroneal and extensor compartment tendons are intact. Increased T2 signal within and atrophy of the intrinsic muscles of the forefoot, nonspecific, but likely related to diabetic muscle changes/denervation. Soft tissue Mild dorsal forefoot soft tissue swelling. No fluid collection or hematoma. No soft tissue mass. IMPRESSION: 1. No osteomyelitis or abscess. Electronically Signed   By: Titus Dubin M.D.   On: 05/29/2018 12:07   Dg Foot Complete Right  Result Date: 05/29/2018 CLINICAL DATA:  Right great toe infection. EXAM: RIGHT FOOT COMPLETE - 3+ VIEW COMPARISON:  Radiographs of May 07, 2018. FINDINGS: There is no evidence of fracture or  dislocation. No lytic destruction is seen to suggest osteomyelitis. There is no evidence of arthropathy or other focal bone abnormality. Vascular calcifications are noted. IMPRESSION: No acute abnormality seen in the right foot. Electronically Signed   By: Marijo Conception M.D.   On: 05/29/2018 09:26   Dg Foot Complete Right  Result Date: 05/07/2018 Please see detailed radiograph report in office note.   Microbiology: Recent Results (from the past 240 hour(s))  Culture, blood (routine x 2)     Status: None (Preliminary result)   Collection Time: 05/29/18  9:01 AM  Result Value Ref Range Status   Specimen Description BLOOD RIGHT ARM  Final   Special Requests   Final    BOTTLES DRAWN AEROBIC AND ANAEROBIC Blood Culture adequate volume   Culture   Final    NO GROWTH 2 DAYS Performed at [facility], [address], [place], [place] [address]    Report Status PENDING  Incomplete  Culture, blood (routine x 2)     Status: None (Preliminary result)   Collection Time: 05/29/18  9:08 AM  Result Value Ref Range Status   Specimen Description BLOOD LEFT ARM  Final   Special Requests   Final    BOTTLES DRAWN AEROBIC AND ANAEROBIC Blood Culture adequate volume   Culture   Final    NO GROWTH 2 DAYS Performed at [facility], [address] [place], [place] [address]    Report Status PENDING  Incomplete  SARS Coronavirus 2 (CEPHEID - Performed in [place] hospital lab), Hosp Order     Status: None   Collection Time: 05/29/18 10:03 AM  Result Value Ref Range Status   SARS Coronavirus 2 NEGATIVE NEGATIVE Final    Comment: (NOTE) If result is NEGATIVE SARS-CoV-2 target nucleic acids are NOT DETECTED. The SARS-CoV-2 RNA is generally detectable in upper and lower  respiratory specimens during the acute phase of infection. The lowest  concentration of SARS-CoV-2 viral copies this assay can detect is 250  copies / mL. A negative result does not preclude SARS-CoV-2 infection  and should not be  used as the sole basis for treatment or other  patient management decisions.  A negative result may occur with  improper specimen collection / handling, submission of specimen other  than nasopharyngeal swab, presence of viral mutation(s) within the  areas targeted by this assay, and inadequate number of viral copies  (<250 copies / mL). A negative result must be combined with clinical  observations, patient history, and epidemiological information. If result is POSITIVE SARS-CoV-2 target nucleic acids are DETECTED. The SARS-CoV-2 RNA is generally detectable in upper and lower  respiratory specimens dur ing the acute phase of infection.  Positive  results are indicative of active infection with SARS-CoV-2.  Clinical  correlation with patient history and other diagnostic information is  necessary to determine patient infection status.  Positive results do  not rule out bacterial infection or co-infection with other viruses. If result is PRESUMPTIVE POSTIVE SARS-CoV-2 nucleic acids MAY BE PRESENT.   A presumptive positive result was obtained on the submitted specimen  and confirmed on repeat testing.  While 2019 novel coronavirus  (SARS-CoV-2) nucleic acids may be present in the submitted sample  additional confirmatory testing may be necessary for epidemiological  and / or clinical management purposes  to differentiate between  SARS-CoV-2 and other Sarbecovirus currently known to infect humans.  If clinically indicated additional testing with an alternate test  methodology [phone]) is advised. The SARS-CoV-2 RNA is generally  detectable in upper and lower respiratory sp ecimens during the acute  phase of infection. The expected result is Negative. Fact Sheet for Patients:  StrictlyIdeas.no Fact Sheet for Healthcare Providers: BankingDealers.co.za This test is not yet approved or cleared by the [place] FDA and has been authorized  for detection and/or diagnosis of SARS-CoV-2 by FDA under an Emergency Use Authorization (EUA).  This EUA will remain in effect (meaning this test can be used) for the duration of the COVID-19 declaration under Section 564(b)(1) of the Act, 21 U.S.C. section 360bbb-3(b)(1), unless the authorization is terminated or revoked sooner. Performed at [facility], [address], [place], [place] [address]      Labs: Basic Metabolic Panel: Recent Labs  Lab 05/29/18 0901 05/30/18 0552  NA 135 137  K 4.3 4.0  CL 99 103  CO2 26 25  GLUCOSE 135* 140*  BUN 19 21  CREATININE 1.40* 1.33*  CALCIUM  9.2 8.9   CBC: Recent Labs  Lab 05/29/18 0901 05/30/18 0552 06/01/18 0550  WBC 8.8 6.7 7.9  NEUTROABS 4.8  --   --   HGB 12.1* 11.4* 10.9*  HCT 37.3* 34.4* 33.5*  MCV 101.1* 99.7 100.6*  PLT 224 208 209   CBG: Recent Labs  Lab 05/31/18 1143 05/31/18 1657 05/31/18 2126 06/01/18 0711 06/01/18 1100  GLUCAP 163* 159* 160* 146* 170*   Signed:  Barton Dubois MD.  [facility] 06/01/2018, 2:16 PM

## 2018-06-02 DIAGNOSIS — Z7901 Long term (current) use of anticoagulants: Secondary | ICD-10-CM | POA: Diagnosis not present

## 2018-06-02 DIAGNOSIS — E785 Hyperlipidemia, unspecified: Secondary | ICD-10-CM | POA: Diagnosis not present

## 2018-06-02 DIAGNOSIS — I480 Paroxysmal atrial fibrillation: Secondary | ICD-10-CM | POA: Diagnosis not present

## 2018-06-02 DIAGNOSIS — N183 Chronic kidney disease, stage 3 (moderate): Secondary | ICD-10-CM | POA: Diagnosis not present

## 2018-06-02 DIAGNOSIS — E1122 Type 2 diabetes mellitus with diabetic chronic kidney disease: Secondary | ICD-10-CM | POA: Diagnosis not present

## 2018-06-02 DIAGNOSIS — I5032 Chronic diastolic (congestive) heart failure: Secondary | ICD-10-CM | POA: Diagnosis not present

## 2018-06-02 DIAGNOSIS — Z7984 Long term (current) use of oral hypoglycemic drugs: Secondary | ICD-10-CM | POA: Diagnosis not present

## 2018-06-02 DIAGNOSIS — L03031 Cellulitis of right toe: Secondary | ICD-10-CM | POA: Diagnosis not present

## 2018-06-02 DIAGNOSIS — I130 Hypertensive heart and chronic kidney disease with heart failure and stage 1 through stage 4 chronic kidney disease, or unspecified chronic kidney disease: Secondary | ICD-10-CM | POA: Diagnosis not present

## 2018-06-02 DIAGNOSIS — E039 Hypothyroidism, unspecified: Secondary | ICD-10-CM | POA: Diagnosis not present

## 2018-06-03 ENCOUNTER — Telehealth: Payer: Self-pay | Admitting: Internal Medicine

## 2018-06-03 DIAGNOSIS — S91101A Unspecified open wound of right great toe without damage to nail, initial encounter: Secondary | ICD-10-CM | POA: Diagnosis not present

## 2018-06-03 DIAGNOSIS — S91105A Unspecified open wound of left lesser toe(s) without damage to nail, initial encounter: Secondary | ICD-10-CM | POA: Diagnosis not present

## 2018-06-03 LAB — CULTURE, BLOOD (ROUTINE X 2)
Culture: NO GROWTH
Culture: NO GROWTH
Special Requests: ADEQUATE
Special Requests: ADEQUATE

## 2018-06-03 NOTE — Telephone Encounter (Signed)
New message:     Patient daughter calling concerning some medication that ER put him back on some medications that Dr.Hilty stopped giving it to the patient/ please call.

## 2018-06-03 NOTE — Telephone Encounter (Signed)
Spoke with daughter, ok per DPR regarding medications. Per daughter patient was in hospital recently and restarted on Lasix and Lisinopril. Reviewed chart and advised as of last visit in November with Dr Debara Pickett he was taking both, not d/c by Dr Debara Pickett. Daughter thinks maybe nephrologist stopped. Daughter stated she would follow up with them. Asked that she call back with updated medications

## 2018-06-10 DIAGNOSIS — L089 Local infection of the skin and subcutaneous tissue, unspecified: Secondary | ICD-10-CM | POA: Diagnosis not present

## 2018-06-10 DIAGNOSIS — E119 Type 2 diabetes mellitus without complications: Secondary | ICD-10-CM | POA: Diagnosis not present

## 2018-06-10 DIAGNOSIS — N183 Chronic kidney disease, stage 3 (moderate): Secondary | ICD-10-CM | POA: Diagnosis not present

## 2018-06-11 DIAGNOSIS — E785 Hyperlipidemia, unspecified: Secondary | ICD-10-CM | POA: Diagnosis not present

## 2018-06-11 DIAGNOSIS — L03031 Cellulitis of right toe: Secondary | ICD-10-CM | POA: Diagnosis not present

## 2018-06-11 DIAGNOSIS — I130 Hypertensive heart and chronic kidney disease with heart failure and stage 1 through stage 4 chronic kidney disease, or unspecified chronic kidney disease: Secondary | ICD-10-CM | POA: Diagnosis not present

## 2018-06-11 DIAGNOSIS — E1122 Type 2 diabetes mellitus with diabetic chronic kidney disease: Secondary | ICD-10-CM | POA: Diagnosis not present

## 2018-06-11 DIAGNOSIS — I480 Paroxysmal atrial fibrillation: Secondary | ICD-10-CM | POA: Diagnosis not present

## 2018-06-11 DIAGNOSIS — Z7984 Long term (current) use of oral hypoglycemic drugs: Secondary | ICD-10-CM | POA: Diagnosis not present

## 2018-06-11 DIAGNOSIS — I5032 Chronic diastolic (congestive) heart failure: Secondary | ICD-10-CM | POA: Diagnosis not present

## 2018-06-11 DIAGNOSIS — Z7901 Long term (current) use of anticoagulants: Secondary | ICD-10-CM | POA: Diagnosis not present

## 2018-06-11 DIAGNOSIS — N183 Chronic kidney disease, stage 3 (moderate): Secondary | ICD-10-CM | POA: Diagnosis not present

## 2018-06-11 DIAGNOSIS — E039 Hypothyroidism, unspecified: Secondary | ICD-10-CM | POA: Diagnosis not present

## 2018-06-12 ENCOUNTER — Ambulatory Visit (HOSPITAL_COMMUNITY): Payer: Medicare Other | Attending: Family Medicine

## 2018-06-12 ENCOUNTER — Other Ambulatory Visit: Payer: Self-pay

## 2018-06-12 ENCOUNTER — Encounter (HOSPITAL_COMMUNITY): Payer: Self-pay

## 2018-06-12 DIAGNOSIS — S91201A Unspecified open wound of right great toe with damage to nail, initial encounter: Secondary | ICD-10-CM | POA: Diagnosis not present

## 2018-06-12 DIAGNOSIS — R600 Localized edema: Secondary | ICD-10-CM | POA: Insufficient documentation

## 2018-06-12 NOTE — Therapy (Signed)
[place], [place], [address] Phone: [phone]   Fax:  [phone]  Wound Care Evaluation  Patient Details  Name: [name] MRN: [id number] Date of Birth: 1927-01-22 Referring Provider (PT): [name], MD   Encounter Date: 06/12/2018  PT End of Session - 06/12/18 1313    Visit Number  1    Number of Visits  15    Date for PT Re-Evaluation  07/24/18    Authorization Type  UHC Medicare    Authorization Time Period  06/12/18-07/24/18    Authorization - Visit Number  1    Authorization - Number of Visits  10    PT Start Time  0901    PT Stop Time  0944    PT Time Calculation (min)  43 min    Activity Tolerance  Patient tolerated treatment well    Behavior During Therapy  [facility] for tasks assessed/performed       Past Medical History:  Diagnosis Date  . Arthritis   . Borderline diabetic   . Glaucoma   . Hyperlipidemia   . Hypertension   . PVD (peripheral vascular disease) ([place])   . Sleep apnea    Cpap ordered but doesnt use    Past Surgical History:  Procedure Laterality Date  . APPENDECTOMY  1943  . CATARACT EXTRACTION  2012   x2  . COLONOSCOPY N/A 11/01/2014   Procedure: COLONOSCOPY;  Surgeon: Aviva Signs, MD;  [facility];  Service: Gastroenterology;  Laterality: N/A;  . ESOPHAGOGASTRODUODENOSCOPY N/A 11/01/2014   Procedure: ESOPHAGOGASTRODUODENOSCOPY (EGD);  Surgeon: Aviva Signs, MD;  Location: [facility];  Service: Gastroenterology;  Laterality: N/A;  . HERNIA REPAIR Left   . INGUINAL HERNIA REPAIR Right 03/01/2013   Procedure: RIGHT INGUINAL HERNIORRHAPHY;  Surgeon: Jamesetta So, MD;  Location: [facility] ORS;  Service: General;  Laterality: Right;  . INSERTION OF MESH Right 03/01/2013   Procedure: INSERTION OF MESH;  Surgeon: Jamesetta So, MD;  Location: [facility] OR[facility];  Service: General;  Laterality: Right;  . NM [place]  06/05/2010   no significant ischemia  . stent   06/14/2010   left leg  . US ECHOCARDIOGRAPHY  01/21/2006   moderate mitral annular ca+, mild MR, AOV moderately sclerotic.    There were no vitals filed for this visit.  Subjective Assessment - 06/12/18 1025    Pertinent History  Per chart review patient was seen on 05/07/18 at [facility] in [place]. He was seen by Dr. Ila Mcgill who noted the nail to be damaged and loose with drainage coming from medial and lateral side of wound. There was redness noted in inner phalangeal joint and pt reported pain/tenderness around entire toe. Dr. Paulla Dolly removed the nail and tissue from nail bed and margins. He prescribed antibiotics for local infection and had instructed the patient on foot soaks. Pt was then seen in the ED on 05/29/18 for increased swelling and redness of Rt great toe.     Currently in Pain?  Yes       [facility] PT Assessment - 06/12/18 0001      Assessment   Medical Diagnosis  Rt Great Toe Wound    Referring Provider (PT)  [name], MD    Onset Date/Surgical Date  05/07/18   nail removed by Dr. Paulla Dolly   Prior Therapy  Was recieving [facility] nursing for wound care initially  Precautions   Precautions  None      Restrictions   Weight Bearing Restrictions  No      Balance Screen   Has the patient fallen in the past 6 months  No      [place] residence    Living Arrangements  Alone    Available Help at Discharge  Family   pt's daughter lives close by and helps patient often   Type of [place] - single point      Prior Function   Level of Independence  Independent with household mobility with device;Independent with community mobility with device;Needs assistance with ADLs;Needs assistance with homemaking    Vocation  Retired    Comments  Pt's daughter assists him with food prep sometimes and assists with donning/doffing socks and shoes      Cognition   Overall Cognitive Status  Within  Functional Limits for tasks assessed      Observation/Other Assessments   Skin Integrity  Pt has woung on Rt great toe. He had it removed ~ 3 weeks ago per patient report at a medical office in [place].      Wound Therapy - 06/12/18 1025    Subjective  Patient reports ~ 3-4 weeks ago his Rt big toe nail began lifting up. He went to a doctor in [place] and they removed the great tow ~ 3 weeks ago. He states he had been changing the dressing daily himself and it has not healed. He saw Dr. Hilma Favors and he recommended wound care. The patient had a nurse come to the house yesterday and treat the wound but he is going to stop recieving home care as he would prefer to come here. His daughte rwas wtih him at arrival and following session she reported she had gotten through to home agency and cancelled care with them.     Patient and Family Stated Goals  Wound to heal    Date of Onset  05/07/18   Nail removed by Dr. Paulla Dolly   Pain Scale  0-10    Pain Score  6     Pain Type  Chronic pain    Pain Location  Toe (Comment which one)   Rt great toe   Pain Orientation  Right;Distal    Pain Descriptors / Indicators  Throbbing;Tender    Pain Onset  Sudden    Patients Stated Pain Goal  0    Pain Intervention(s)  Distraction    Multiple Pain Sites  No    Evaluation and Treatment Procedures Explained to Patient/Family  Yes    Evaluation and Treatment Procedures  agreed to    Wound Properties Date First Assessed: 05/30/18 Time First Assessed: 2200 Wound Type: Other (Comment) Location: Toe (Comment  which one) , right great toe  Location Orientation: Right Wound Description (Comments): missing toenail Present on Admission: Yes   Dressing Type  Hydrocolloid;Alginate;Gauze (Comment)   medihoney, medihoney hydrocolloid, alginate, gauze wrap   Dressing Changed  Changed    Dressing Status  Old drainage    Dressing Change Frequency  PRN    Site / Wound Assessment  Brown;Black;Painful;Red;Yellow    % Wound base  Red or Granulating  30%   includes inner phalangeal space redness   % Wound base Yellow/Fibrinous Exudate  55%    % Wound base Black/Eschar  15%    Peri-wound Assessment  Edema;Erythema (non-blanchable)    Wound  Length (cm)  2.5 cm    Wound Width (cm)  4.8 cm   across nailbed (2.8); inner phalangeal (2.0)   Wound Depth (cm)  0.2 cm    Wound Volume (cm^3)  2.4 cm^3    Wound Surface Area (cm^2)  12 cm^2    Margins  Unattached edges (unapproximated)    Drainage Amount  Minimal    Drainage Description  No odor;Serosanguineous    Treatment  Cleansed;Debridement (Selective)    Selective Debridement - Location  Rt great toe anterior nailbed    Selective Debridement - Tools Used  Scalpel;Forceps;Scissors    Selective Debridement - Tissue Removed  slough and adheared necrotic/devitalized tissue form wound bed    Wound Therapy - Clinical Statement  [name] presents to our clinic for wound care evaluation of Rt great toe. Per pt report and chart review pt had his Rt great toe nail removed on 05/07/18 and had been instructed on home care. On 05/29/18 he presented to the ED at [facility] for increased Rt great toe drainage, edema, and redness. He had an MRI and plain radiographs and osteomyelitis was ruled out as well as acute fracture of injuries to soft tissue. He was referred for skilled wound care and received treatment yesterday from [place] but the patient and his daughter would prefer he be seen in our office. His wound is on the anterior surface of his Rt great toe. The nail bed is exposed with yellow/brown adhered slough covering ~ 80% of this area. He has a smaller area on medial side of great toe of eschar which is adhered but draining from edges. The inner phalangeal space/lateral boarder of Rt great toe is red and irritated (this was included in full wound measures). There is a dark spot in the center of this space possibly indicating a deeper condition. Patient's wound was tender to debridement and  medihoney gel & colloid was applied to loosen slough. Alginate dressing applied to inner phalangeal space to manage moisture and reduce irritation. [name] is currently on antibiotics and continues to present with edema and non-blanchable erythema of periwound. He will benefit from skilled wound care to promote healing environment.     Wound Therapy - Functional Problem List  walking, showering/bathing, infection risk    Factors Delaying/Impairing Wound Healing  Diabetes Mellitus;Multiple medical problems;Tobacco use;Vascular compromise   PAD, PVD, HTN, DM   Hydrotherapy Plan  Debridement;Dressing change;Patient/family education;Pulsatile lavage with suction;Other (comment)   low level laser therapy   Wound Therapy - Frequency  3X / week   3x/week for 3 weeks, 2x/week for weeks   Wound Therapy - Current Recommendations  PT    Wound Plan  Measure on Fridays (1x/week). Continue to debride necrotic tissue until well granualted with no signs of infection. Dress appropriately to manage adhered slough and drainage. Monitor dark spot on inner phalangeal area.    Dressing   medihoney to eschar on medial Rt great toe, medihoney hydrocolloid on mail bed, alginate across inner phalangeal space and Rt great toe to manage drainage.         Objective measurements completed on examination: See above findings.     PT Education - 06/12/18 1323    Education Details  Educated pt on POC for wound healing. Educated to keep dressing intact and not remove unlees it is saturated in drainage. Educated to keep dressing dry and do not get it wet in shower, discussed cast covers and where pt could purchase one if he  would like to use it to shower.     Person(s) Educated  Patient;Child(ren)   pt's daughter [name]   Methods  Explanation    Comprehension  Verbalized understanding       PT Short Term Goals - 06/12/18 1317      PT SHORT TERM GOAL #1   Title  Wound will be 100% red or granulated tissue with no slough,  eshcar, or necrotic tissue present to indicate wound is moving into proliferative phase of healing.     Time  3    Period  Weeks    Status  New    Target Date  07/03/18      PT SHORT TERM GOAL #2   Title  Patient will no longer have edema or errythema present around periwound indicating no persisting signs of local infection.    Time  3    Period  Weeks    Status  New        PT Long Term Goals - 06/12/18 1320      PT LONG TERM GOAL #1   Title  Wound size with be 25% reduced for surface area indicating good proliferation of wound healing.     Time  3    Period  Weeks    Status  New    Target Date  07/24/18      PT LONG TERM GOAL #2   Title  Patient and daughter will demonstrate proper self care technique to complete wound care management at home when appropriate and wound healing progressed enough.    Time  3    Period  Weeks    Status  New        Plan - 06/12/18 1237    Clinical Impression Statement  [name] presents to our clinic for wound care evaluation of Rt great toe. Per pt report and chart review pt had his Rt great toe nail removed on 05/07/18 and had been instructed on home care. On 05/29/18 he presented to the ED at [facility] for increased Rt great toe drainage, edema, and redness. He had an MRI and plain radiographs and osteomyelitis was ruled out as well as acute fracture of injuries to soft tissue. He was referred for skilled wound care and received treatment yesterday from [place] but the patient and his daughter would prefer he be seen in our office. His wound is on the anterior surface of his Rt great toe. The nail bed is exposed with yellow/brown adhered slough covering ~ 80% of this area. He has a smaller area on medial side of great toe of eschar which is adhered but draining from edges. The inner phalangeal space/lateral boarder of Rt great toe is red and irritated (this was included in full wound measures). There is a dark spot in the center of this space possibly  indicating a deeper condition. Patient's wound was tender to debridement and medihoney gel & colloid was applied to loosen slough. Alginate dressing applied to inner phalangeal space to manage moisture and reduce irritation. [name] is currently on antibiotics and continues to present with edema and non-blanchable erythema of periwound. He will benefit from skilled wound care to promote healing environment.     Personal Factors and Comorbidities  Age;Comorbidity 3+    Examination-Activity Limitations  Bathing;Dressing;Carry;Lift;Locomotion Level    Examination-Participation Restrictions  Cleaning;Laundry;Yard Work;Community Activity;Driving;Shop    Stability/Clinical Decision Making  Evolving/Moderate complexity    Clinical Decision Making  Moderate    Rehab Potential  Fair    PT Frequency  3x / week   3x/week for 3 weeks then 2x/week for 3 more weeks   PT Duration  6 weeks    PT Treatment/Interventions  ADLs/Self Care Home Management;DME Instruction;Manual techniques;Patient/family education;Therapeutic exercise;Taping;Other (comment)   skilled wound care   PT Next Visit Plan   Measure on Fridays (1x/week). Continue to debride necrotic tissue until well granualted with no signs of infection. Dress appropriately to manage adhered slough and drainage. Monitor dark spot on inner phalangeal area.    Consulted and Agree with Plan of Care  Patient       Patient will benefit from skilled therapeutic intervention in order to improve the following deficits and impairments:  Abnormal gait, Decreased balance, Decreased skin integrity, Pain, Increased edema, Decreased range of motion, Decreased mobility, Difficulty walking  Visit Diagnosis: Open wound of right great toe with damage to nail, initial encounter  Localized edema    Problem List Patient Active Problem List   Diagnosis Date Noted  . Type 2 diabetes mellitus with foot ulcer, without long-term current use of insulin ([place])   .  Gastroesophageal reflux disease   . Cellulitis of great toe of right foot 05/29/2018  . Atrial fibrillation, chronic   . Chest pain 07/25/2017  . PAF (paroxysmal atrial fibrillation) ([place]) 07/25/2017  . BPH (benign prostatic hyperplasia) 07/25/2017  . Shortness of breath 05/11/2017  . Hypothyroidism 05/11/2017  . Chronic diastolic CHF (congestive heart failure) ([place]) 05/11/2017  . Aortic valve stenosis 06/12/2016  . RBBB 06/12/2016  . PAD (peripheral artery disease) ([place]) 08/04/2012  . Essential hypertension 08/04/2012  . Hyperlipidemia 08/04/2012  . Dizziness 08/04/2012    Kipp Brood, PT, DPT, [facility] Physical Therapist with [facility]  06/12/2018 1:24 PM    [facility] [address] [place], [place], [address] Phone: [phone]   Fax:  [phone]  Name: [name] MRN: [id number] Date of Birth: 09-Jul-1927

## 2018-06-15 ENCOUNTER — Other Ambulatory Visit: Payer: Self-pay

## 2018-06-15 ENCOUNTER — Ambulatory Visit (HOSPITAL_COMMUNITY): Payer: Medicare Other | Attending: Family Medicine | Admitting: Physical Therapy

## 2018-06-15 DIAGNOSIS — R600 Localized edema: Secondary | ICD-10-CM | POA: Diagnosis not present

## 2018-06-15 DIAGNOSIS — S91201A Unspecified open wound of right great toe with damage to nail, initial encounter: Secondary | ICD-10-CM | POA: Diagnosis not present

## 2018-06-15 NOTE — Therapy (Signed)
[place], [place], [address] Phone: [phone]   Fax:  [phone]  Wound Care Therapy  Patient Details  Name: [name] MRN: [id number] Date of Birth: February 06, 1927 Referring Provider (PT): [name], MD   Encounter Date: 06/15/2018  PT End of Session - 06/15/18 1717    Visit Number  2    Number of Visits  15    Date for PT Re-Evaluation  07/24/18    Authorization Type  UHC Medicare    Authorization Time Period  06/12/18-07/24/18    Authorization - Visit Number  2    Authorization - Number of Visits  10    PT Start Time  1620    PT Stop Time  1655    PT Time Calculation (min)  35 min    Activity Tolerance  Patient tolerated treatment well    Behavior During Therapy  [facility] for tasks assessed/performed       Past Medical History:  Diagnosis Date  . Arthritis   . Borderline diabetic   . Glaucoma   . Hyperlipidemia   . Hypertension   . PVD (peripheral vascular disease) ([place])   . Sleep apnea    Cpap ordered but doesnt use    Past Surgical History:  Procedure Laterality Date  . APPENDECTOMY  1943  . CATARACT EXTRACTION  2012   x2  . COLONOSCOPY N/A 11/01/2014   Procedure: COLONOSCOPY;  Surgeon: Aviva Signs, MD;  [facility];  Service: Gastroenterology;  Laterality: N/A;  . ESOPHAGOGASTRODUODENOSCOPY N/A 11/01/2014   Procedure: ESOPHAGOGASTRODUODENOSCOPY (EGD);  Surgeon: Aviva Signs, MD;  Location: [facility];  Service: Gastroenterology;  Laterality: N/A;  . HERNIA REPAIR Left   . INGUINAL HERNIA REPAIR Right 03/01/2013   Procedure: RIGHT INGUINAL HERNIORRHAPHY;  Surgeon: Jamesetta So, MD;  Location: [facility] OR[facility];  Service: General;  Laterality: Right;  . INSERTION OF MESH Right 03/01/2013   Procedure: INSERTION OF MESH;  Surgeon: Jamesetta So, MD;  Location: [facility];  Service: General;  Laterality: Right;  . NM [place]  06/05/2010   no significant ischemia  . stent   06/14/2010   left leg  . US ECHOCARDIOGRAPHY  01/21/2006   moderate mitral annular ca+, mild MR, AOV moderately sclerotic.    There were no vitals filed for this visit.              Wound Therapy - 06/15/18 1710    Subjective  pt states his toe has been sore and looks a little red today.  Pt reports he has not removed the dressings.  States it hurts worse with elevation    Patient and Family Stated Goals  Wound to heal    Date of Onset  05/07/18   Nail removed by Dr. Paulla Dolly   Pain Scale  0-10    Pain Score  4     Pain Type  Chronic pain    Pain Location  Toe (Comment which one)    Pain Orientation  Right;Distal    Pain Descriptors / Indicators  Throbbing;Tender    Pain Onset  Sudden    Patients Stated Pain Goal  0    Pain Intervention(s)  Distraction    Evaluation and Treatment Procedures Explained to Patient/Family  Yes    Evaluation and Treatment Procedures  agreed to    Wound Properties Date First Assessed: 05/30/18 Time First Assessed: 2200 Wound Type: Other (Comment) Location: Toe (Comment  which one) , right great toe  Location Orientation: Right Wound Description (Comments): missing toenail Present on Admission: Yes   Dressing Type  Hydrocolloid;Alginate;Gauze (Comment)   medihoney, medihoney hydrocolloid, alginate, gauze wrap   Dressing Changed  Changed    Dressing Status  Old drainage    Dressing Change Frequency  PRN    Site / Wound Assessment  Brown;Black;Painful;Red;Yellow    % Wound base Red or Granulating  30%   includes inner phalangeal space redness   % Wound base Yellow/Fibrinous Exudate  55%    % Wound base Black/Eschar  15%    Peri-wound Assessment  Edema;Erythema (non-blanchable)    Margins  Unattached edges (unapproximated)    Drainage Amount  Minimal    Drainage Description  No odor;Serosanguineous    Treatment  Debridement (Selective);Cleansed    Selective Debridement - Location  Rt great toe anterior nailbed    Selective Debridement - Tools  Used  Scalpel;Forceps;Scissors    Selective Debridement - Tissue Removed  slough and adheared necrotic/devitalized tissue form wound bed    Wound Therapy - Clinical Statement  pt returns today with dressing intact. Noted maceration skin around wound from drainage.  Increased redness great toe and all other toes this session.  Dressing noted to be tight around his foot upon removal; unsure if from swelling that occured after dressing.  Cleansed well and debrided away devitalized tissue.  placed medihoney colloid on necrotic area and used xeroform on remaining areas.  2X2 and medipore used to secure dressing.     Wound Therapy - Functional Problem List  walking, showering/bathing, infection risk    Factors Delaying/Impairing Wound Healing  Diabetes Mellitus;Multiple medical problems;Tobacco use;Vascular compromise   PAD, PVD, HTN, DM   Hydrotherapy Plan  Debridement;Dressing change;Patient/family education;Pulsatile lavage with suction;Other (comment)   low level laser therapy   Wound Therapy - Frequency  3X / week   3x/week for 3 weeks, 2x/week for weeks   Wound Therapy - Current Recommendations  PT    Wound Plan  Measure on Fridays (1x/week). Continue to debride necrotic tissue until well granualted with no signs of infection. Dress appropriately to manage adhered slough and drainage. Monitor dark spot on inner phalangeal area.    Dressing   medihoney colloid to eschar on medial Rt great toe. xerform to remaining areas, 2X2 and medipore tape                PT Short Term Goals - 06/12/18 1317      PT SHORT TERM GOAL #1   Title  Wound will be 100% red or granulated tissue with no slough, eshcar, or necrotic tissue present to indicate wound is moving into proliferative phase of healing.     Time  3    Period  Weeks    Status  New    Target Date  07/03/18      PT SHORT TERM GOAL #2   Title  Patient will no longer have edema or errythema present around periwound indicating no persisting  signs of local infection.    Time  3    Period  Weeks    Status  New        PT Long Term Goals - 06/12/18 1320      PT LONG TERM GOAL #1   Title  Wound size with be 25% reduced for surface area indicating good proliferation of wound healing.     Time  3    Period  Weeks  Status  New    Target Date  07/24/18      PT LONG TERM GOAL #2   Title  Patient and daughter will demonstrate proper self care technique to complete wound care management at home when appropriate and wound healing progressed enough.    Time  3    Period  Weeks    Status  New              Patient will benefit from skilled therapeutic intervention in order to improve the following deficits and impairments:     Visit Diagnosis: Open wound of right great toe with damage to nail, initial encounter  Localized edema     Problem List Patient Active Problem List   Diagnosis Date Noted  . Type 2 diabetes mellitus with foot ulcer, without long-term current use of insulin ([place])   . Gastroesophageal reflux disease   . Cellulitis of great toe of right foot 05/29/2018  . Atrial fibrillation, chronic   . Chest pain 07/25/2017  . PAF (paroxysmal atrial fibrillation) ([place]) 07/25/2017  . BPH (benign prostatic hyperplasia) 07/25/2017  . Shortness of breath 05/11/2017  . Hypothyroidism 05/11/2017  . Chronic diastolic CHF (congestive heart failure) ([place]) 05/11/2017  . Aortic valve stenosis 06/12/2016  . RBBB 06/12/2016  . PAD (peripheral artery disease) ([place]) 08/04/2012  . Essential hypertension 08/04/2012  . Hyperlipidemia 08/04/2012  . Dizziness 08/04/2012   Teena Irani, PTA/CLT [phone]  Teena Irani 06/15/2018, 5:18 PM  [facility] [address] [place], [place], [address] Phone: [phone]   Fax:  [phone]  Name: [name] MRN: [id number] Date of Birth: 1927/06/16

## 2018-06-17 ENCOUNTER — Other Ambulatory Visit: Payer: Self-pay

## 2018-06-17 ENCOUNTER — Ambulatory Visit (HOSPITAL_COMMUNITY): Payer: Medicare Other | Admitting: Physical Therapy

## 2018-06-17 DIAGNOSIS — R600 Localized edema: Secondary | ICD-10-CM

## 2018-06-17 DIAGNOSIS — S91201A Unspecified open wound of right great toe with damage to nail, initial encounter: Secondary | ICD-10-CM

## 2018-06-17 NOTE — Therapy (Signed)
[place], [place], [address] Phone: [phone]   Fax:  [phone]  Wound Care Therapy  Patient Details  Name: [name] MRN: [id number] Date of Birth: 01/08/1928 Referring Provider (PT): [name], MD   Encounter Date: 06/17/2018  PT End of Session - 06/17/18 1203    Visit Number  3    Number of Visits  15    Date for PT Re-Evaluation  07/24/18    Authorization Type  UHC Medicare    Authorization Time Period  06/12/18-07/24/18    Authorization - Visit Number  3    Authorization - Number of Visits  10    PT Start Time  0930    PT Stop Time  1000    PT Time Calculation (min)  30 min    Activity Tolerance  Patient tolerated treatment well    Behavior During Therapy  [facility] for tasks assessed/performed       Past Medical History:  Diagnosis Date  . Arthritis   . Borderline diabetic   . Glaucoma   . Hyperlipidemia   . Hypertension   . PVD (peripheral vascular disease) ([place])   . Sleep apnea    Cpap ordered but doesnt use    Past Surgical History:  Procedure Laterality Date  . APPENDECTOMY  1943  . CATARACT EXTRACTION  2012   x2  . COLONOSCOPY N/A 11/01/2014   Procedure: COLONOSCOPY;  Surgeon: Aviva Signs, MD;  [facility];  Service: Gastroenterology;  Laterality: N/A;  . ESOPHAGOGASTRODUODENOSCOPY N/A 11/01/2014   Procedure: ESOPHAGOGASTRODUODENOSCOPY (EGD);  Surgeon: Aviva Signs, MD;  [facility];  Service: Gastroenterology;  Laterality: N/A;  . HERNIA REPAIR Left   . INGUINAL HERNIA REPAIR Right 03/01/2013   Procedure: RIGHT INGUINAL HERNIORRHAPHY;  Surgeon: Jamesetta So, MD;  Location: [facility] ORS;  Service: General;  Laterality: Right;  . INSERTION OF MESH Right 03/01/2013   Procedure: INSERTION OF MESH;  Surgeon: Jamesetta So, MD;  Location: [facility] OR[facility];  Service: General;  Laterality: Right;  . NM [place]  06/05/2010   no significant ischemia  . stent   06/14/2010   left leg  . US ECHOCARDIOGRAPHY  01/21/2006   moderate mitral annular ca+, mild MR, AOV moderately sclerotic.    There were no vitals filed for this visit.              Wound Therapy - 06/17/18 1148    Subjective  pt states the bandaging was much more comfortable.    Patient and Family Stated Goals  Wound to heal    Date of Onset  05/07/18   Nail removed by Dr. Paulla Dolly   Pain Scale  0-10    Pain Score  3     Pain Type  Chronic pain    Pain Location  Toe (Comment which one)    Pain Orientation  Right;Distal    Pain Descriptors / Indicators  Tender    Evaluation and Treatment Procedures Explained to Patient/Family  Yes    Evaluation and Treatment Procedures  agreed to    Wound Properties Date First Assessed: 05/30/18 Time First Assessed: 2200 Wound Type: Other (Comment) Location: Toe (Comment  which one) , right great toe  Location Orientation: Right Wound Description (Comments): missing toenail Present on Admission: Yes   Dressing Type  Alginate;Gauze (Comment)   medihoney, medihoney hydrocolloid, alginate, gauze wrap   Dressing Changed  Changed  Dressing Status  Old drainage    Dressing Change Frequency  PRN    Site / Wound Assessment  Brown;Black;Painful;Red;Yellow    % Wound base Red or Granulating  45%   includes inner phalangeal space redness   % Wound base Yellow/Fibrinous Exudate  50%    % Wound base Black/Eschar  5%    Peri-wound Assessment  Edema;Erythema (non-blanchable)    Margins  Unattached edges (unapproximated)    Drainage Amount  Minimal    Drainage Description  No odor;Serosanguineous    Treatment  Cleansed;Debridement (Selective)    Selective Debridement - Location  much reduced.  continues wot have maceration, however not as bad as last visit.  Able to debride large amount of dry devitalized tissue from perimeter of wound.  2 adherent slough areas located medial and centrally sill adherent but loosening. continued to use medihoney in this  area, however used gel gauze instead of the colloid sheet.  Xeroform used on all granluating areas and added silver hydro over areas to protect from moisture.  continued with medipore to secure rather than using gauze to reduce bulk and chance of increasing compression    Selective Debridement - Tools Used  Scalpel;Forceps;Scissors    Selective Debridement - Tissue Removed  slough and adheared necrotic/devitalized tissue form wound bed    Wound Therapy - Clinical Statement  pt returns today with dressing intact. Noted maceration skin around wound from drainage.  Increased redness great toe and all other toes this session.  Dressing noted to be tight around his foot upon removal; unsure if from swelling that occured after dressing.  Cleansed well and debrided away devitalized tissue.  placed medihoney colloid on necrotic area and used xeroform on remaining areas.  2X2 and medipore used to secure dressing.     Wound Therapy - Functional Problem List  walking, showering/bathing, infection risk    Factors Delaying/Impairing Wound Healing  Diabetes Mellitus;Multiple medical problems;Tobacco use;Vascular compromise   PAD, PVD, HTN, DM   Hydrotherapy Plan  Debridement;Dressing change;Patient/family education;Pulsatile lavage with suction;Other (comment)   low level laser therapy   Wound Therapy - Frequency  3X / week   3x/week for 3 weeks, 2x/week for weeks   Wound Therapy - Current Recommendations  PT    Wound Plan  Measure on Fridays (1x/week). Continue to debride necrotic tissue until well granualted with no signs of infection. Dress appropriately to manage adhered slough and drainage. Monitor dark spot on inner phalangeal area.    Dressing   medihoney colloid to eschar on medial Rt great toe. xerform to remaining areas, 2X2 and medipore tape                PT Short Term Goals - 06/12/18 1317      PT SHORT TERM GOAL #1   Title  Wound will be 100% red or granulated tissue with no slough,  eshcar, or necrotic tissue present to indicate wound is moving into proliferative phase of healing.     Time  3    Period  Weeks    Status  New    Target Date  07/03/18      PT SHORT TERM GOAL #2   Title  Patient will no longer have edema or errythema present around periwound indicating no persisting signs of local infection.    Time  3    Period  Weeks    Status  New        PT Long Term Goals - 06/12/18 1320  PT LONG TERM GOAL #1   Title  Wound size with be 25% reduced for surface area indicating good proliferation of wound healing.     Time  3    Period  Weeks    Status  New    Target Date  07/24/18      PT LONG TERM GOAL #2   Title  Patient and daughter will demonstrate proper self care technique to complete wound care management at home when appropriate and wound healing progressed enough.    Time  3    Period  Weeks    Status  New              Patient will benefit from skilled therapeutic intervention in order to improve the following deficits and impairments:     Visit Diagnosis: Open wound of right great toe with damage to nail, initial encounter  Localized edema     Problem List Patient Active Problem List   Diagnosis Date Noted  . Type 2 diabetes mellitus with foot ulcer, without long-term current use of insulin ([place])   . Gastroesophageal reflux disease   . Cellulitis of great toe of right foot 05/29/2018  . Atrial fibrillation, chronic   . Chest pain 07/25/2017  . PAF (paroxysmal atrial fibrillation) ([place]) 07/25/2017  . BPH (benign prostatic hyperplasia) 07/25/2017  . Shortness of breath 05/11/2017  . Hypothyroidism 05/11/2017  . Chronic diastolic CHF (congestive heart failure) ([place]) 05/11/2017  . Aortic valve stenosis 06/12/2016  . RBBB 06/12/2016  . PAD (peripheral artery disease) ([place]) 08/04/2012  . Essential hypertension 08/04/2012  . Hyperlipidemia 08/04/2012  . Dizziness 08/04/2012   Teena Irani,  PTA/CLT [phone]  Teena Irani 06/17/2018, 12:04 PM  [facility] [address] [place], [place], [address] Phone: [phone]   Fax:  [phone]  Name: [name] MRN: [id number] Date of Birth: 1927/06/22

## 2018-06-19 ENCOUNTER — Ambulatory Visit (HOSPITAL_COMMUNITY): Payer: Medicare Other | Admitting: Physical Therapy

## 2018-06-19 ENCOUNTER — Other Ambulatory Visit: Payer: Self-pay

## 2018-06-19 DIAGNOSIS — R600 Localized edema: Secondary | ICD-10-CM

## 2018-06-19 DIAGNOSIS — S91201A Unspecified open wound of right great toe with damage to nail, initial encounter: Secondary | ICD-10-CM | POA: Diagnosis not present

## 2018-06-19 NOTE — Therapy (Signed)
[place], [place], [address] Phone: [phone]   Fax:  [phone]  Wound Care Therapy  Patient Details  Name: [name] MRN: [id number] Date of Birth: Jun 03, 1927 Referring Provider (PT): [name], MD   Encounter Date: 06/19/2018  PT End of Session - 06/19/18 1528    Visit Number  4    Number of Visits  15    Date for PT Re-Evaluation  07/24/18    Authorization Type  UHC Medicare    Authorization Time Period  06/12/18-07/24/18    Authorization - Visit Number  4    Authorization - Number of Visits  10    PT Start Time  1430    PT Stop Time  1455    PT Time Calculation (min)  25 min    Activity Tolerance  Patient tolerated treatment well    Behavior During Therapy  [facility] for tasks assessed/performed       Past Medical History:  Diagnosis Date  . Arthritis   . Borderline diabetic   . Glaucoma   . Hyperlipidemia   . Hypertension   . PVD (peripheral vascular disease) ([place])   . Sleep apnea    Cpap ordered but doesnt use    Past Surgical History:  Procedure Laterality Date  . APPENDECTOMY  1943  . CATARACT EXTRACTION  2012   x2  . COLONOSCOPY N/A 11/01/2014   Procedure: COLONOSCOPY;  Surgeon: Aviva Signs, MD;  [facility];  Service: Gastroenterology;  Laterality: N/A;  . ESOPHAGOGASTRODUODENOSCOPY N/A 11/01/2014   Procedure: ESOPHAGOGASTRODUODENOSCOPY (EGD);  Surgeon: Aviva Signs, MD;  [facility];  Service: Gastroenterology;  Laterality: N/A;  . HERNIA REPAIR Left   . INGUINAL HERNIA REPAIR Right 03/01/2013   Procedure: RIGHT INGUINAL HERNIORRHAPHY;  Surgeon: Jamesetta So, MD;  Location: [facility] OR[facility];  Service: General;  Laterality: Right;  . INSERTION OF MESH Right 03/01/2013   Procedure: INSERTION OF MESH;  Surgeon: Jamesetta So, MD;  Location: [facility];  Service: General;  Laterality: Right;  . NM [place]  06/05/2010   no significant ischemia  . stent   06/14/2010   left leg  . US ECHOCARDIOGRAPHY  01/21/2006   moderate mitral annular ca+, mild MR, AOV moderately sclerotic.    There were no vitals filed for this visit.              Wound Therapy - 06/19/18 1515    Subjective  pt states it has been doing good but started hurting worse last night when he layed down in the bed and eleveated it.      Patient and Family Stated Goals  Wound to heal    Date of Onset  05/07/18   Nail removed by Dr. Paulla Dolly   Pain Scale  0-10    Pain Score  4     Pain Type  Chronic pain    Pain Location  Toe (Comment which one)    Pain Orientation  Right    Pain Descriptors / Indicators  Tender    Pain Intervention(s)  --   keeping in dependent position   Evaluation and Treatment Procedures Explained to Patient/Family  Yes    Evaluation and Treatment Procedures  agreed to    Wound Properties Date First Assessed: 05/30/18 Time First Assessed: 2200 Wound Type: Other (Comment) Location: Toe (Comment  which one) , right great toe  Location Orientation: Right Wound Description (Comments): missing  toenail Present on Admission: Yes   Dressing Type  Alginate;Gauze (Comment)   medihoney, medihoney hydrocolloid, alginate, gauze wrap   Dressing Changed  Changed    Dressing Status  Old drainage    Dressing Change Frequency  PRN    Site / Wound Assessment  Brown;Black;Painful;Red;Yellow    % Wound base Red or Granulating  45%   includes inner phalangeal space redness   % Wound base Yellow/Fibrinous Exudate  50%    % Wound base Black/Eschar  5%    Peri-wound Assessment  Edema;Erythema (non-blanchable)    Wound Length (cm)  3.5 cm   was 2.5cm   Wound Width (cm)  4.2 cm   was 4.8cm   Wound Depth (cm)  0.2 cm   was 0.2cm., unsure of depth where nail used to be be   Wound Volume (cm^3)  2.94 cm^3    Wound Surface Area (cm^2)  14.7 cm^2    Margins  Unattached edges (unapproximated)    Drainage Amount  Minimal    Drainage Description  No odor;Serosanguineous     Treatment  Cleansed;Debridement (Selective)    Selective Debridement - Location  much reduced.  continues wot have maceration, however not as bad as last visit.  Able to debride large amount of dry devitalized tissue from perimeter of wound.  2 adherent slough areas located medial and centrally sill adherent but loosening. continued to use medihoney in this area, however used gel gauze instead of the colloid sheet.  Xeroform used on all granluating areas and added silver hydro over areas to protect from moisture.  continued with medipore to secure rather than using gauze to reduce bulk and chance of increasing compression    Selective Debridement - Tools Used  Scalpel;Forceps;Scissors    Selective Debridement - Tissue Removed  slough and adheared necrotic/devitalized tissue form wound bed    Wound Therapy - Clinical Statement  dressing remains intact.  Noted more drainage and maceration in cuticle area of great toe and around.  Marked redenss with skin marker around borders to help patient identify if redness begins to spread.  No signs/symptoms of infection, however given instructions on what to do it redness increases  or condition worsens.  Pt verbalized understanding.      Wound Therapy - Functional Problem List  walking, showering/bathing, infection risk    Factors Delaying/Impairing Wound Healing  Diabetes Mellitus;Multiple medical problems;Tobacco use;Vascular compromise   PAD, PVD, HTN, DM   Hydrotherapy Plan  Debridement;Dressing change;Patient/family education;Pulsatile lavage with suction;Other (comment)   low level laser therapy   Wound Therapy - Frequency  3X / week   3x/week for 3 weeks, 2x/week for weeks   Wound Therapy - Current Recommendations  PT    Wound Plan  Measure on Fridays (1x/week). Continue to debride necrotic tissue until well granualted with no signs of infection. Dress appropriately to manage adhered slough and drainage. Monitor dark spot on inner phalangeal area.     Dressing   medihoney colloid to eschar on medial Rt great toe. xerform to remaining areas, 2X2 and medipore tape                PT Short Term Goals - 06/12/18 1317      PT SHORT TERM GOAL #1   Title  Wound will be 100% red or granulated tissue with no slough, eshcar, or necrotic tissue present to indicate wound is moving into proliferative phase of healing.     Time  3    Period  Weeks    Status  New    Target Date  07/03/18      PT SHORT TERM GOAL #2   Title  Patient will no longer have edema or errythema present around periwound indicating no persisting signs of local infection.    Time  3    Period  Weeks    Status  New        PT Long Term Goals - 06/12/18 1320      PT LONG TERM GOAL #1   Title  Wound size with be 25% reduced for surface area indicating good proliferation of wound healing.     Time  3    Period  Weeks    Status  New    Target Date  07/24/18      PT LONG TERM GOAL #2   Title  Patient and daughter will demonstrate proper self care technique to complete wound care management at home when appropriate and wound healing progressed enough.    Time  3    Period  Weeks    Status  New              Patient will benefit from skilled therapeutic intervention in order to improve the following deficits and impairments:     Visit Diagnosis: Open wound of right great toe with damage to nail, initial encounter  Localized edema     Problem List Patient Active Problem List   Diagnosis Date Noted  . Type 2 diabetes mellitus with foot ulcer, without long-term current use of insulin ([place])   . Gastroesophageal reflux disease   . Cellulitis of great toe of right foot 05/29/2018  . Atrial fibrillation, chronic   . Chest pain 07/25/2017  . PAF (paroxysmal atrial fibrillation) ([place]) 07/25/2017  . BPH (benign prostatic hyperplasia) 07/25/2017  . Shortness of breath 05/11/2017  . Hypothyroidism 05/11/2017  . Chronic diastolic CHF (congestive heart  failure) ([place]) 05/11/2017  . Aortic valve stenosis 06/12/2016  . RBBB 06/12/2016  . PAD (peripheral artery disease) ([place]) 08/04/2012  . Essential hypertension 08/04/2012  . Hyperlipidemia 08/04/2012  . Dizziness 08/04/2012   Teena Irani, PTA/CLT [phone]  Teena Irani 06/19/2018, 3:28 PM  [facility] [address] [place], [place], [address] Phone: [phone]   Fax:  [phone]  Name: [name] MRN: [id number] Date of Birth: 1927/06/16

## 2018-06-22 ENCOUNTER — Other Ambulatory Visit: Payer: Self-pay

## 2018-06-22 ENCOUNTER — Ambulatory Visit (HOSPITAL_COMMUNITY): Payer: Medicare Other | Admitting: Physical Therapy

## 2018-06-22 DIAGNOSIS — S91201A Unspecified open wound of right great toe with damage to nail, initial encounter: Secondary | ICD-10-CM | POA: Diagnosis not present

## 2018-06-22 DIAGNOSIS — R600 Localized edema: Secondary | ICD-10-CM | POA: Diagnosis not present

## 2018-06-22 NOTE — Therapy (Signed)
[place], [place], [address] Phone: [phone]   Fax:  [phone]  Wound Care Therapy  Patient Details  Name: [name] MRN: [id number] Date of Birth: 1927/08/24 Referring Provider (PT): [name], MD   Encounter Date: 06/22/2018  PT End of Session - 06/22/18 0934    Visit Number  5    Number of Visits  15    Date for PT Re-Evaluation  07/24/18    Authorization Type  UHC Medicare    Authorization Time Period  06/12/18-07/24/18    Authorization - Visit Number  5    Authorization - Number of Visits  10    PT Start Time  0830    PT Stop Time  0915    PT Time Calculation (min)  45 min    Activity Tolerance  Patient tolerated treatment well    Behavior During Therapy  [facility] for tasks assessed/performed       Past Medical History:  Diagnosis Date  . Arthritis   . Borderline diabetic   . Glaucoma   . Hyperlipidemia   . Hypertension   . PVD (peripheral vascular disease) ([place])   . Sleep apnea    Cpap ordered but doesnt use    Past Surgical History:  Procedure Laterality Date  . APPENDECTOMY  1943  . CATARACT EXTRACTION  2012   x2  . COLONOSCOPY N/A 11/01/2014   Procedure: COLONOSCOPY;  Surgeon: Aviva Signs, MD;  [facility];  Service: Gastroenterology;  Laterality: N/A;  . ESOPHAGOGASTRODUODENOSCOPY N/A 11/01/2014   Procedure: ESOPHAGOGASTRODUODENOSCOPY (EGD);  Surgeon: Aviva Signs, MD;  [facility];  Service: Gastroenterology;  Laterality: N/A;  . HERNIA REPAIR Left   . INGUINAL HERNIA REPAIR Right 03/01/2013   Procedure: RIGHT INGUINAL HERNIORRHAPHY;  Surgeon: Jamesetta So, MD;  Location: [facility];  Service: General;  Laterality: Right;  . INSERTION OF MESH Right 03/01/2013   Procedure: INSERTION OF MESH;  Surgeon: Jamesetta So, MD;  Location: [facility];  Service: General;  Laterality: Right;  . NM [place]  06/05/2010   no significant ischemia  . stent   06/14/2010   left leg  . US ECHOCARDIOGRAPHY  01/21/2006   moderate mitral annular ca+, mild MR, AOV moderately sclerotic.    There were no vitals filed for this visit.              Wound Therapy - 06/22/18 0928    Subjective  pt states its a little tender on the bottom but that is all.        Patient and Family Stated Goals  Wound to heal    Date of Onset  05/07/18   Nail removed by Dr. Paulla Dolly   Pain Scale  0-10    Pain Score  3     Pain Type  Chronic pain    Pain Location  Toe (Comment which one)    Pain Orientation  Right    Pain Descriptors / Indicators  Tender    Evaluation and Treatment Procedures Explained to Patient/Family  Yes    Evaluation and Treatment Procedures  agreed to    Wound Properties Date First Assessed: 05/30/18 Time First Assessed: 2200 Wound Type: Other (Comment) Location: Toe (Comment  which one) , right great toe  Location Orientation: Right Wound Description (Comments): missing toenail Present on Admission: Yes   Dressing Type  Alginate;Gauze (Comment)   medihoney, medihoney hydrocolloid, alginate, gauze wrap  Dressing Status  Old drainage    Dressing Change Frequency  PRN    Site / Wound Assessment  Brown;Black;Painful;Red;Yellow    % Wound base Red or Granulating  45%   includes inner phalangeal space redness   % Wound base Yellow/Fibrinous Exudate  50%    % Wound base Black/Eschar  5%    Peri-wound Assessment  Edema;Erythema (non-blanchable)    Margins  Unattached edges (unapproximated)    Drainage Amount  Minimal    Drainage Description  No odor;Serosanguineous    Treatment  Cleansed;Debridement (Selective)    Selective Debridement - Location  dry devitalized tissue and sloth    Selective Debridement - Tools Used  Scalpel;Forceps;Scissors    Selective Debridement - Tissue Removed  slough and adheared necrotic/devitalized tissue form wound bed    Wound Therapy - Clinical Statement  Much improved integrity and coloring of skin today.  No  redness outside of line drawn last session.  Able to scrape most necrotic tissue from medial side and some from lateral.  Used combination of dressings to keep all areas moist but not macerated. continued with light gauze and used coban, but not at any stretch to secure bandage today.  Pt reported overall comfort.     Wound Therapy - Functional Problem List  walking, showering/bathing, infection risk    Factors Delaying/Impairing Wound Healing  Diabetes Mellitus;Multiple medical problems;Tobacco use;Vascular compromise   PAD, PVD, HTN, DM   Hydrotherapy Plan  Debridement;Dressing change;Patient/family education;Pulsatile lavage with suction;Other (comment)   low level laser therapy   Wound Therapy - Frequency  3X / week   3x/week for 3 weeks, 2x/week for weeks   Wound Therapy - Current Recommendations  PT    Wound Plan  Measure on Fridays (1x/week). Continue to debride necrotic tissue until well granualted with no signs of infection. Dress appropriately to manage adhered slough and drainage. Monitor dark spot on inner phalangeal area.    Dressing   medihoney colloid to eschar on medial Rt great toe. xerform to remaining areas, 2X2 and medipore tape              PT Education - 06/22/18 0933    Education Details  educated on arterial wounds vs venous wounds.  Instructed to keep dressing intact unless pain increases, continue to keep dry.    Person(s) Educated  Patient    Methods  Explanation    Comprehension  Verbalized understanding       PT Short Term Goals - 06/12/18 1317      PT SHORT TERM GOAL #1   Title  Wound will be 100% red or granulated tissue with no slough, eshcar, or necrotic tissue present to indicate wound is moving into proliferative phase of healing.     Time  3    Period  Weeks    Status  New    Target Date  07/03/18      PT SHORT TERM GOAL #2   Title  Patient will no longer have edema or errythema present around periwound indicating no persisting signs of local  infection.    Time  3    Period  Weeks    Status  New        PT Long Term Goals - 06/12/18 1320      PT LONG TERM GOAL #1   Title  Wound size with be 25% reduced for surface area indicating good proliferation of wound healing.     Time  3  Period  Weeks    Status  New    Target Date  07/24/18      PT LONG TERM GOAL #2   Title  Patient and daughter will demonstrate proper self care technique to complete wound care management at home when appropriate and wound healing progressed enough.    Time  3    Period  Weeks    Status  New              Patient will benefit from skilled therapeutic intervention in order to improve the following deficits and impairments:     Visit Diagnosis: Open wound of right great toe with damage to nail, initial encounter  Localized edema     Problem List Patient Active Problem List   Diagnosis Date Noted  . Type 2 diabetes mellitus with foot ulcer, without long-term current use of insulin ([place])   . Gastroesophageal reflux disease   . Cellulitis of great toe of right foot 05/29/2018  . Atrial fibrillation, chronic   . Chest pain 07/25/2017  . PAF (paroxysmal atrial fibrillation) ([place]) 07/25/2017  . BPH (benign prostatic hyperplasia) 07/25/2017  . Shortness of breath 05/11/2017  . Hypothyroidism 05/11/2017  . Chronic diastolic CHF (congestive heart failure) ([place]) 05/11/2017  . Aortic valve stenosis 06/12/2016  . RBBB 06/12/2016  . PAD (peripheral artery disease) ([place]) 08/04/2012  . Essential hypertension 08/04/2012  . Hyperlipidemia 08/04/2012  . Dizziness 08/04/2012   Teena Irani, PTA/CLT [phone]  Teena Irani 06/22/2018, 9:34 AM  [facility] [address] [place], [place], [address] Phone: [phone]   Fax:  [phone]  Name: [name] MRN: [id number] Date of Birth: [birth year]

## 2018-06-24 ENCOUNTER — Other Ambulatory Visit: Payer: Self-pay

## 2018-06-24 ENCOUNTER — Ambulatory Visit (HOSPITAL_COMMUNITY): Payer: Medicare Other | Admitting: Physical Therapy

## 2018-06-24 DIAGNOSIS — S91201A Unspecified open wound of right great toe with damage to nail, initial encounter: Secondary | ICD-10-CM | POA: Diagnosis not present

## 2018-06-24 DIAGNOSIS — R600 Localized edema: Secondary | ICD-10-CM

## 2018-06-24 NOTE — Therapy (Signed)
[place], [place], [address] Phone: [phone]   Fax:  [phone]  Wound Care Therapy  Patient Details  Name: [name] MRN: [id number] Date of Birth: 01-13-1928 Referring Provider (PT): [name], MD   Encounter Date: 06/24/2018  PT End of Session - 06/24/18 1136    Visit Number  6    Number of Visits  15    Date for PT Re-Evaluation  07/24/18    Authorization Type  UHC Medicare    Authorization Time Period  06/12/18-07/24/18    Authorization - Visit Number  6    Authorization - Number of Visits  10    PT Start Time  1033    PT Stop Time  1100    PT Time Calculation (min)  27 min    Activity Tolerance  Patient tolerated treatment well    Behavior During Therapy  [facility] for tasks assessed/performed       Past Medical History:  Diagnosis Date  . Arthritis   . Borderline diabetic   . Glaucoma   . Hyperlipidemia   . Hypertension   . PVD (peripheral vascular disease) ([place])   . Sleep apnea    Cpap ordered but doesnt use    Past Surgical History:  Procedure Laterality Date  . APPENDECTOMY  1943  . CATARACT EXTRACTION  2012   x2  . COLONOSCOPY N/A 11/01/2014   Procedure: COLONOSCOPY;  Surgeon: Aviva Signs, MD;  [facility];  Service: Gastroenterology;  Laterality: N/A;  . ESOPHAGOGASTRODUODENOSCOPY N/A 11/01/2014   Procedure: ESOPHAGOGASTRODUODENOSCOPY (EGD);  Surgeon: Aviva Signs, MD;  Location[facility];  Service: Gastroenterology;  Laterality: N/A;  . HERNIA REPAIR Left   . INGUINAL HERNIA REPAIR Right 03/01/2013   Procedure: RIGHT INGUINAL HERNIORRHAPHY;  Surgeon: Jamesetta So, MD;  Location: [facility] ORS;  Service: General;  Laterality: Right;  . INSERTION OF MESH Right 03/01/2013   Procedure: INSERTION OF MESH;  Surgeon: Jamesetta So, MD;  Location: [facility] ORS;  Service: General;  Laterality: Right;  . NM [place]  06/05/2010   no significant ischemia  . stent   06/14/2010   left leg  . US ECHOCARDIOGRAPHY  01/21/2006   moderate mitral annular ca+, mild MR, AOV moderately sclerotic.    There were no vitals filed for this visit.              Wound Therapy - 06/24/18 1131    Subjective  pt states both of his feet are swelling today so feels he should take his lasix.  No other issues.    Patient and Family Stated Goals  Wound to heal    Date of Onset  05/07/18   Nail removed by Dr. Paulla Dolly   Pain Scale  0-10    Pain Score  0-No pain    Evaluation and Treatment Procedures Explained to Patient/Family  Yes    Evaluation and Treatment Procedures  agreed to    Wound Properties Date First Assessed: 05/30/18 Time First Assessed: 2200 Wound Type: Other (Comment) Location: Toe (Comment  which one) , right great toe  Location Orientation: Right Wound Description (Comments): missing toenail Present on Admission: Yes   Dressing Type  Alginate;Gauze (Comment)    Dressing Changed  Changed    Dressing Status  Old drainage    Dressing Change Frequency  PRN    Site / Wound Assessment  Brown;Black;Painful;Red;Yellow    % Wound base Red  or Granulating  45%   includes inner phalangeal space redness   % Wound base Yellow/Fibrinous Exudate  50%    % Wound base Black/Eschar  5%    Peri-wound Assessment  Edema;Erythema (non-blanchable)    Wound Length (cm)  3.5 cm    Wound Width (cm)  4.2 cm    Wound Depth (cm)  0.1 cm    Wound Volume (cm^3)  1.47 cm^3    Wound Surface Area (cm^2)  14.7 cm^2    Margins  Unattached edges (unapproximated)    Drainage Amount  Minimal    Drainage Description  No odor;Serosanguineous    Treatment  Cleansed;Debridement (Selective)    Selective Debridement - Location  dry devitalized tissue and sloth    Selective Debridement - Tools Used  Scalpel;Forceps;Scissors    Selective Debridement - Tissue Removed  slough and adheared necrotic/devitalized tissue form wound bed    Wound Therapy - Clinical Statement  integrity of  perimeter of great toe continues to improve.  Good perfusion noted to toe as well.  medial area and top of toe (where nail bed removed) is darker, however feel this is devitalized tissue that is not yet ready to be removed.  No sponginess felt beneath these areas.  General area appears dryer today.  Removed slough and other tissue as able , moisturized perimeter well and used xerform all over this session to help increase moisture.  Secured with loose coban to keep dressing in place    Wound Therapy - Functional Problem List  walking, showering/bathing, infection risk    Factors Delaying/Impairing Wound Healing  Diabetes Mellitus;Multiple medical problems;Tobacco use;Vascular compromise   PAD, PVD, HTN, DM   Hydrotherapy Plan  Debridement;Dressing change;Patient/family education;Pulsatile lavage with suction;Other (comment)   low level laser therapy   Wound Therapy - Frequency  3X / week   3x/week for 3 weeks, 2x/week for weeks   Wound Therapy - Current Recommendations  PT    Wound Plan  Measure on Fridays (1x/week). Continue to debride necrotic tissue until well granualted with no signs of infection. Dress appropriately to manage adhered slough and drainage. Monitor dark spot on inner phalangeal area.    Dressing   xeroform, gauze and coban                PT Short Term Goals - 06/12/18 1317      PT SHORT TERM GOAL #1   Title  Wound will be 100% red or granulated tissue with no slough, eshcar, or necrotic tissue present to indicate wound is moving into proliferative phase of healing.     Time  3    Period  Weeks    Status  New    Target Date  07/03/18      PT SHORT TERM GOAL #2   Title  Patient will no longer have edema or errythema present around periwound indicating no persisting signs of local infection.    Time  3    Period  Weeks    Status  New        PT Long Term Goals - 06/12/18 1320      PT LONG TERM GOAL #1   Title  Wound size with be 25% reduced for surface area  indicating good proliferation of wound healing.     Time  3    Period  Weeks    Status  New    Target Date  07/24/18      PT LONG TERM GOAL #2  Title  Patient and daughter will demonstrate proper self care technique to complete wound care management at home when appropriate and wound healing progressed enough.    Time  3    Period  Weeks    Status  New              Patient will benefit from skilled therapeutic intervention in order to improve the following deficits and impairments:     Visit Diagnosis: Open wound of right great toe with damage to nail, initial encounter  Localized edema     Problem List Patient Active Problem List   Diagnosis Date Noted  . Type 2 diabetes mellitus with foot ulcer, without long-term current use of insulin ([place])   . Gastroesophageal reflux disease   . Cellulitis of great toe of right foot 05/29/2018  . Atrial fibrillation, chronic   . Chest pain 07/25/2017  . PAF (paroxysmal atrial fibrillation) ([place]) 07/25/2017  . BPH (benign prostatic hyperplasia) 07/25/2017  . Shortness of breath 05/11/2017  . Hypothyroidism 05/11/2017  . Chronic diastolic CHF (congestive heart failure) ([place]) 05/11/2017  . Aortic valve stenosis 06/12/2016  . RBBB 06/12/2016  . PAD (peripheral artery disease) ([place]) 08/04/2012  . Essential hypertension 08/04/2012  . Hyperlipidemia 08/04/2012  . Dizziness 08/04/2012   Teena Irani, PTA/CLT [phone]  Teena Irani 06/24/2018, 11:37 AM  [place] [address] [place], [place], [address] Phone: [phone]   Fax:  [phone]  Name: [name] MRN: [id number] Date of Birth: October 24, 1927

## 2018-06-26 ENCOUNTER — Other Ambulatory Visit: Payer: Self-pay

## 2018-06-26 ENCOUNTER — Ambulatory Visit (HOSPITAL_COMMUNITY): Payer: Medicare Other

## 2018-06-26 ENCOUNTER — Encounter (HOSPITAL_COMMUNITY): Payer: Self-pay

## 2018-06-26 DIAGNOSIS — S91201A Unspecified open wound of right great toe with damage to nail, initial encounter: Secondary | ICD-10-CM

## 2018-06-26 DIAGNOSIS — R600 Localized edema: Secondary | ICD-10-CM

## 2018-06-26 NOTE — Therapy (Signed)
[place], [place], [address] Phone: [phone]   Fax:  [phone]  Wound Care Therapy  Patient Details  Name: [name] MRN: [id number] Date of Birth: [birth year] Referring Provider (PT): [name], MD   Encounter Date: 06/26/2018  PT End of Session - 06/26/18 1539    Visit Number  7    Number of Visits  15    Date for PT Re-Evaluation  07/24/18    Authorization Type  UHC Medicare    Authorization Time Period  06/12/18-07/24/18    Authorization - Visit Number  7    Authorization - Number of Visits  10    PT Start Time  2458    PT Stop Time  1400    PT Time Calculation (min)  45 min    Activity Tolerance  Patient tolerated treatment well    Behavior During Therapy  [facility] for tasks assessed/performed       Past Medical History:  Diagnosis Date  . Arthritis   . Borderline diabetic   . Glaucoma   . Hyperlipidemia   . Hypertension   . PVD (peripheral vascular disease) ([place])   . Sleep apnea    Cpap ordered but doesnt use    Past Surgical History:  Procedure Laterality Date  . APPENDECTOMY  1943  . CATARACT EXTRACTION  2012   x2  . COLONOSCOPY N/A 11/01/2014   Procedure: COLONOSCOPY;  Surgeon: Aviva Signs, MD;  [facility];  Service: Gastroenterology;  Laterality: N/A;  . ESOPHAGOGASTRODUODENOSCOPY N/A 11/01/2014   Procedure: ESOPHAGOGASTRODUODENOSCOPY (EGD);  Surgeon: Aviva Signs, MD;  [facility];  Service: Gastroenterology;  Laterality: N/A;  . HERNIA REPAIR Left   . INGUINAL HERNIA REPAIR Right 03/01/2013   Procedure: RIGHT INGUINAL HERNIORRHAPHY;  Surgeon: Jamesetta So, MD;  Location: [facility] ORS;  Service: General;  Laterality: Right;  . INSERTION OF MESH Right 03/01/2013   Procedure: INSERTION OF MESH;  Surgeon: Jamesetta So, MD;  Location: [facility] OR[facility];  Service: General;  Laterality: Right;  . NM [place]  06/05/2010   no significant ischemia  . stent   06/14/2010   left leg  . US ECHOCARDIOGRAPHY  01/21/2006   moderate mitral annular ca+, mild MR, AOV moderately sclerotic.    There were no vitals filed for this visit.   Wound Therapy - 06/26/18 1418    Subjective  Pt states he is still having swelling. He reports he is no longer on antibiotics and has been off for about 5 days. He denies paina t start of session but reports it still hurts throughout the day and he gets occasional sharp pains.    Patient and Family Stated Goals  Wound to heal    Date of Onset  05/07/18   Nail removed by Dr. Paulla Dolly   Pain Scale  0-10    Pain Score  0-No pain    Evaluation and Treatment Procedures Explained to Patient/Family  Yes    Evaluation and Treatment Procedures  agreed to    Wound Properties Date First Assessed: 05/30/18 Time First Assessed: 2200 Wound Type: Other (Comment) Location: Toe (Comment  which one) , right great toe  Location Orientation: Right Wound Description (Comments): missing toenail Present on Admission: Yes   Dressing Type  Alginate;Gauze (Comment)    Dressing Changed  Changed    Dressing Status  Old drainage    Dressing Change Frequency  PRN  Site / Wound Assessment  Brown;Black;Painful;Red;Yellow    % Wound base Red or Granulating  40%   includes inner phalangeal space redness   % Wound base Yellow/Fibrinous Exudate  50%    % Wound base Black/Eschar  10%   darkening spot on inner phalangeal space   Peri-wound Assessment  Edema;Erythema (non-blanchable)   appears to have extended furhter away from wound   Margins  Unattached edges (unapproximated)    Drainage Amount  Minimal    Drainage Description  No odor;Serosanguineous    Treatment  Cleansed;Debridement (Selective)    Selective Debridement - Location  dry devitalized tissue and sloth    Selective Debridement - Tools Used  Scalpel;Forceps;Scissors    Selective Debridement - Tissue Removed  slough and adheared necrotic/devitalized tissue form wound bed    Wound Therapy  - Clinical Statement  Patient's wound is slightly improved. Periwound has no signs of maceration however erythema has increased and extended beyond the drawn outline from last session. The area is also warm to touch and he continues to have edema in the LE. He was instructed to call his referring/PCP and request more antibiotics as there are signs of local infection. Nail bed continues to have brown darker adhered slough and brown adhered slough was present along medial side of toe as well. Able to debride some of this devitalized tissue as well as some slough from the cuticle area of the toe. The inner phalangeal space is not as irritated as prior but continues to be red with a dark spot on the lateral side of great toe. Xeroform applied today to maintain moisture and toe wrapped with gauze and loose coban to keep in place. He will continue to benefit from skilled wound care to promote healing.    Wound Therapy - Functional Problem List  walking, showering/bathing, infection risk    Factors Delaying/Impairing Wound Healing  Diabetes Mellitus;Multiple medical problems;Tobacco use;Vascular compromise   PAD, PVD, HTN, DM   Hydrotherapy Plan  Debridement;Dressing change;Patient/family education;Pulsatile lavage with suction;Other (comment)   low level laser therapy   Wound Therapy - Frequency  3X / week   3x/week for 3 weeks, 2x/week for weeks   Wound Therapy - Current Recommendations  PT    Wound Plan  Measure on Fridays (1x/week). Continue to debride necrotic tissue until well granualted with no signs of infection. Dress appropriately to manage adhered slough and drainage. Monitor dark spot on inner phalangeal area.    Dressing   xeroform, gauze and coban         PT Short Term Goals - 06/12/18 1317      PT SHORT TERM GOAL #1   Title  Wound will be 100% red or granulated tissue with no slough, eshcar, or necrotic tissue present to indicate wound is moving into proliferative phase of healing.      Time  3    Period  Weeks    Status  New    Target Date  07/03/18      PT SHORT TERM GOAL #2   Title  Patient will no longer have edema or errythema present around periwound indicating no persisting signs of local infection.    Time  3    Period  Weeks    Status  New        PT Long Term Goals - 06/12/18 1320      PT LONG TERM GOAL #1   Title  Wound size with be 25% reduced for surface area indicating  good proliferation of wound healing.     Time  3    Period  Weeks    Status  New    Target Date  07/24/18      PT LONG TERM GOAL #2   Title  Patient and daughter will demonstrate proper self care technique to complete wound care management at home when appropriate and wound healing progressed enough.    Time  3    Period  Weeks    Status  New         Plan - 06/26/18 1541    Clinical Impression Statement  see above    Personal Factors and Comorbidities  Age;Comorbidity 3+    Examination-Activity Limitations  Bathing;Dressing;Carry;Lift;Locomotion Level    Examination-Participation Restrictions  Cleaning;Laundry;Yard Work;Community Activity;Driving;Shop    Stability/Clinical Decision Making  Evolving/Moderate complexity    Rehab Potential  Fair    PT Frequency  3x / week   3x/week for 3 weeks then 2x/week for 3 more weeks   PT Duration  6 weeks    PT Treatment/Interventions  ADLs/Self Care Home Management;DME Instruction;Manual techniques;Patient/family education;Therapeutic exercise;Taping;Other (comment)   skilled wound care   PT Next Visit Plan  see above    Consulted and Agree with Plan of Care  Patient       Patient will benefit from skilled therapeutic intervention in order to improve the following deficits and impairments:  Abnormal gait, Decreased balance, Decreased skin integrity, Pain, Increased edema, Decreased range of motion, Decreased mobility, Difficulty walking  Visit Diagnosis: Open wound of right great toe with damage to nail, initial  encounter  Localized edema     Problem List Patient Active Problem List   Diagnosis Date Noted  . Type 2 diabetes mellitus with foot ulcer, without long-term current use of insulin ([place])   . Gastroesophageal reflux disease   . Cellulitis of great toe of right foot 05/29/2018  . Atrial fibrillation, chronic   . Chest pain 07/25/2017  . PAF (paroxysmal atrial fibrillation) ([place]) 07/25/2017  . BPH (benign prostatic hyperplasia) 07/25/2017  . Shortness of breath 05/11/2017  . Hypothyroidism 05/11/2017  . Chronic diastolic CHF (congestive heart failure) ([place]) 05/11/2017  . Aortic valve stenosis 06/12/2016  . RBBB 06/12/2016  . PAD (peripheral artery disease) ([place]) 08/04/2012  . Essential hypertension 08/04/2012  . Hyperlipidemia 08/04/2012  . Dizziness 08/04/2012    Kipp Brood, PT, DPT, [facility] Physical Therapist with [facility]  06/26/2018 3:41 PM    [place] [address] [place], [place], [address] Phone: [phone]   Fax:  [phone]  Name: [name] MRN: [id number] Date of Birth: Dec 04, 1927

## 2018-06-29 ENCOUNTER — Other Ambulatory Visit: Payer: Self-pay

## 2018-06-29 ENCOUNTER — Ambulatory Visit (HOSPITAL_COMMUNITY): Payer: Medicare Other | Admitting: Physical Therapy

## 2018-06-29 DIAGNOSIS — S91201A Unspecified open wound of right great toe with damage to nail, initial encounter: Secondary | ICD-10-CM | POA: Diagnosis not present

## 2018-06-29 DIAGNOSIS — N183 Chronic kidney disease, stage 3 (moderate): Secondary | ICD-10-CM | POA: Diagnosis not present

## 2018-06-29 DIAGNOSIS — R600 Localized edema: Secondary | ICD-10-CM | POA: Diagnosis not present

## 2018-06-29 DIAGNOSIS — E1122 Type 2 diabetes mellitus with diabetic chronic kidney disease: Secondary | ICD-10-CM | POA: Diagnosis not present

## 2018-06-29 DIAGNOSIS — L03031 Cellulitis of right toe: Secondary | ICD-10-CM | POA: Diagnosis not present

## 2018-06-29 DIAGNOSIS — I130 Hypertensive heart and chronic kidney disease with heart failure and stage 1 through stage 4 chronic kidney disease, or unspecified chronic kidney disease: Secondary | ICD-10-CM | POA: Diagnosis not present

## 2018-06-29 NOTE — Therapy (Signed)
[place], [place], [address] Phone: [phone]   Fax:  [phone]  Wound Care Therapy  Patient Details  Name: [name] MRN: [id number] Date of Birth: 1927-12-04 Referring Provider (PT): [name], MD   Encounter Date: 06/29/2018    Past Medical History:  Diagnosis Date  . Arthritis   . Borderline diabetic   . Glaucoma   . Hyperlipidemia   . Hypertension   . PVD (peripheral vascular disease) ([place])   . Sleep apnea    Cpap ordered but doesnt use    Past Surgical History:  Procedure Laterality Date  . APPENDECTOMY  1943  . CATARACT EXTRACTION  2012   x2  . COLONOSCOPY N/A 11/01/2014   Procedure: COLONOSCOPY;  Surgeon: Aviva Signs, MD;  [facility];  Service: Gastroenterology;  Laterality: N/A;  . ESOPHAGOGASTRODUODENOSCOPY N/A 11/01/2014   Procedure: ESOPHAGOGASTRODUODENOSCOPY (EGD);  Surgeon: Aviva Signs, MD;  [facility];  Service: Gastroenterology;  Laterality: N/A;  . HERNIA REPAIR Left   . INGUINAL HERNIA REPAIR Right 03/01/2013   Procedure: RIGHT INGUINAL HERNIORRHAPHY;  Surgeon: Jamesetta So, MD;  Location: [facility];  Service: General;  Laterality: Right;  . INSERTION OF MESH Right 03/01/2013   Procedure: INSERTION OF MESH;  Surgeon: Jamesetta So, MD;  Location: [facility];  Service: General;  Laterality: Right;  . NM [place]  06/05/2010   no significant ischemia  . stent  06/14/2010   left leg  . US ECHOCARDIOGRAPHY  01/21/2006   moderate mitral annular ca+, mild MR, AOV moderately sclerotic.    There were no vitals filed for this visit.              Wound Therapy - 06/29/18 1224    Subjective  pt states his toe is more tender and hurts at 8/10.  Comes today using standard walker. STates MD put him back on antibiotics but a different one.    Patient and Family Stated Goals  Wound to heal    Date of Onset  05/07/18   Nail removed by  Dr. Paulla Dolly   Pain Scale  0-10    Pain Score  8     Pain Type  Acute pain    Pain Location  Toe (Comment which one)    Pain Orientation  Right    Pain Descriptors / Indicators  Aching    Pain Onset  With Activity    Patients Stated Pain Goal  0    Evaluation and Treatment Procedures Explained to Patient/Family  Yes    Evaluation and Treatment Procedures  agreed to    Wound Properties Date First Assessed: 05/30/18 Time First Assessed: 2200 Wound Type: Other (Comment) Location: Toe (Comment  which one) , right great toe  Location Orientation: Right Wound Description (Comments): missing toenail Present on Admission: Yes   Dressing Type  Alginate;Gauze (Comment)    Dressing Status  Old drainage    Dressing Change Frequency  PRN    Site / Wound Assessment  Brown;Black;Painful;Red;Yellow    % Wound base Red or Granulating  40%   includes inner phalangeal space redness   % Wound base Yellow/Fibrinous Exudate  50%    % Wound base Black/Eschar  10%   darkening spot on inner phalangeal space   Peri-wound Assessment  Edema;Erythema (non-blanchable)   appears to have extended furhter away from wound   Margins  Unattached edges (unapproximated)    Drainage  Amount  Minimal    Drainage Description  No odor;Serosanguineous    Treatment  Cleansed;Debridement (Selective)    Selective Debridement - Location  dry devitalized tissue and sloth    Selective Debridement - Tools Used  Scalpel;Forceps;Scissors    Selective Debridement - Tissue Removed  slough and adheared necrotic/devitalized tissue form wound bed    Wound Therapy - Clinical Statement  Continued redness surrounding wound.  Remarked edges with marker and debrided to tolerance.  Continued with xeroform and withheld coban this sesison, only using gauze and medipore tape to secure.      Wound Therapy - Functional Problem List  walking, showering/bathing, infection risk    Factors Delaying/Impairing Wound Healing  Diabetes Mellitus;Multiple medical  problems;Tobacco use;Vascular compromise   PAD, PVD, HTN, DM   Hydrotherapy Plan  Debridement;Dressing change;Patient/family education;Pulsatile lavage with suction;Other (comment)   low level laser therapy   Wound Therapy - Frequency  3X / week   3x/week for 3 weeks, 2x/week for weeks   Wound Therapy - Current Recommendations  PT    Wound Plan  Measure on Fridays (1x/week). Continue to debride necrotic tissue until well granualted with no signs of infection. Dress appropriately to manage adhered slough and drainage. Monitor dark spot on inner phalangeal area.    Dressing   xeroform, gauze and coban                PT Short Term Goals - 06/12/18 1317      PT SHORT TERM GOAL #1   Title  Wound will be 100% red or granulated tissue with no slough, eshcar, or necrotic tissue present to indicate wound is moving into proliferative phase of healing.     Time  3    Period  Weeks    Status  New    Target Date  07/03/18      PT SHORT TERM GOAL #2   Title  Patient will no longer have edema or errythema present around periwound indicating no persisting signs of local infection.    Time  3    Period  Weeks    Status  New        PT Long Term Goals - 06/12/18 1320      PT LONG TERM GOAL #1   Title  Wound size with be 25% reduced for surface area indicating good proliferation of wound healing.     Time  3    Period  Weeks    Status  New    Target Date  07/24/18      PT LONG TERM GOAL #2   Title  Patient and daughter will demonstrate proper self care technique to complete wound care management at home when appropriate and wound healing progressed enough.    Time  3    Period  Weeks    Status  New              Patient will benefit from skilled therapeutic intervention in order to improve the following deficits and impairments:     Visit Diagnosis: No diagnosis found.     Problem List Patient Active Problem List   Diagnosis Date Noted  . Type 2 diabetes mellitus  with foot ulcer, without long-term current use of insulin ([place])   . Gastroesophageal reflux disease   . Cellulitis of great toe of right foot 05/29/2018  . Atrial fibrillation, chronic   . Chest pain 07/25/2017  . PAF (paroxysmal atrial fibrillation) ([place]) 07/25/2017  . BPH (benign  prostatic hyperplasia) 07/25/2017  . Shortness of breath 05/11/2017  . Hypothyroidism 05/11/2017  . Chronic diastolic CHF (congestive heart failure) ([place]) 05/11/2017  . Aortic valve stenosis 06/12/2016  . RBBB 06/12/2016  . PAD (peripheral artery disease) ([place]) 08/04/2012  . Essential hypertension 08/04/2012  . Hyperlipidemia 08/04/2012  . Dizziness 08/04/2012   Teena Irani, PTA/CLT [phone]  Teena Irani 06/29/2018, 12:28 PM  [place] [address] [place], [place], [address] Phone: [phone]   Fax:  [phone]  Name: RODY KEADLE MRN: [id number] Date of Birth: 22-Dec-1927

## 2018-07-01 ENCOUNTER — Other Ambulatory Visit: Payer: Self-pay

## 2018-07-01 ENCOUNTER — Ambulatory Visit (HOSPITAL_COMMUNITY): Payer: Medicare Other

## 2018-07-01 ENCOUNTER — Encounter (HOSPITAL_COMMUNITY): Payer: Self-pay

## 2018-07-01 ENCOUNTER — Telehealth (HOSPITAL_COMMUNITY): Payer: Self-pay

## 2018-07-01 DIAGNOSIS — S91201A Unspecified open wound of right great toe with damage to nail, initial encounter: Secondary | ICD-10-CM | POA: Diagnosis not present

## 2018-07-01 DIAGNOSIS — R600 Localized edema: Secondary | ICD-10-CM

## 2018-07-01 NOTE — Telephone Encounter (Signed)
I called and spoke with [name] per her father's request to update her on the wound healing. I discussed his slow progress and that it appeared to have reduced redness and swelling today which is a good sign and that we are gently debriding due to his PAD so as not to damage any healthy tissue. I discussed the concern that there could be worsening arterial circulation in his Rt LE and that this could be contributing to his delayed wound healing. I encouraged her to set up and appointment with his vascular surgeon Dr. Debara Pickett his cardiovascular doctor to assess changes to LE circulation. She plans to do so.   Kipp Brood, PT, DPT, [facility] Physical Therapist with [facility]  07/01/2018 2:24 PM

## 2018-07-01 NOTE — Therapy (Signed)
[place], [place], [address] Phone: [phone]   Fax:  [phone]  Wound Care Therapy  Patient Details  Name: [name] MRN: [id number] Date of Birth: [birth year] Referring Provider (PT): [name], MD   Encounter Date: 07/01/2018   PT End of Session - 07/01/18 1116    Visit Number  8    Number of Visits  15    Date for PT Re-Evaluation  07/24/18    Authorization Type  U[employer] Medicare    Authorization Time Period  06/12/18-07/24/18    Authorization - Visit Number  8    Authorization - Number of Visits  10    PT Start Time  0918    PT Stop Time  1000    PT Time Calculation (min)  42 min    Activity Tolerance  Patient tolerated treatment well    Behavior During Therapy  [facility] for tasks assessed/performed       Past Medical History:  Diagnosis Date  . Arthritis   . Borderline diabetic   . Glaucoma   . Hyperlipidemia   . Hypertension   . PVD (peripheral vascular disease) ([place])   . Sleep apnea    Cpap ordered but doesnt use    Past Surgical History:  Procedure Laterality Date  . APPENDECTOMY  1943  . CATARACT EXTRACTION  2012   x2  . COLONOSCOPY N/A 11/01/2014   Procedure: COLONOSCOPY;  Surgeon: Aviva Signs, MD;  [facility];  Service: Gastroenterology;  Laterality: N/A;  . ESOPHAGOGASTRODUODENOSCOPY N/A 11/01/2014   Procedure: ESOPHAGOGASTRODUODENOSCOPY (EGD);  Surgeon: Aviva Signs, MD;  [facility];  Service: Gastroenterology;  Laterality: N/A;  . HERNIA REPAIR Left   . INGUINAL HERNIA REPAIR Right 03/01/2013   Procedure: RIGHT INGUINAL HERNIORRHAPHY;  Surgeon: Jamesetta So, MD;  Location: [facility] OR[facility];  Service: General;  Laterality: Right;  . INSERTION OF MESH Right 03/01/2013   Procedure: INSERTION OF MESH;  Surgeon: Jamesetta So, MD;  Location: [facility] OR[facility];  Service: General;  Laterality: Right;  . NM [place]  06/05/2010   no significant ischemia  . stent   06/14/2010   left leg  . US ECHOCARDIOGRAPHY  01/21/2006   moderate mitral annular ca+, mild MR, AOV moderately sclerotic.    There were no vitals filed for this visit.   Wound Therapy - 07/01/18 1107    Subjective  Patient reports his toe has been hurting this morning and that he had some pain last night as well. He would like [place] to call and talk to his daughter about his toe as he struggles to communicate what we share with him. He denies pain at the start of session and states he gets sharp pains periodically.     Patient and Family Stated Goals  Wound to heal    Date of Onset  05/07/18   Nail removed by Dr. Paulla Dolly   Pain Scale  0-10    Pain Score  0-No pain    Evaluation and Treatment Procedures Explained to Patient/Family  Yes    Evaluation and Treatment Procedures  agreed to    Wound Properties Date First Assessed: 05/30/18 Time First Assessed: 2200 Wound Type: Other (Comment) Location: Toe (Comment  which one) , right great toe  Location Orientation: Right Wound Description (Comments): missing toenail Present on Admission: Yes   Dressing Type  Alginate;Gauze (Comment)    Dressing Changed  Changed  Dressing Status  Old drainage    Dressing Change Frequency  PRN    Site / Wound Assessment  Brown;Black;Painful;Red;Yellow    % Wound base Red or Granulating  30%   includes inner phalangeal space redness   % Wound base Yellow/Fibrinous Exudate  40%   yellow and pale   % Wound base Black/Eschar  30%   darkening spot on inner phalangeal space   Peri-wound Assessment  Edema;Erythema (non-blanchable)    Margins  Unattached edges (unapproximated)    Drainage Amount  Minimal    Drainage Description  No odor;Serosanguineous    Treatment  Cleansed;Debridement (Selective)    Selective Debridement - Location  dry devitalized tissue and sloth    Selective Debridement - Tools Used  Forceps;Scissors    Selective Debridement - Tissue Removed  slough and adheared necrotic/devitalized tissue  form wound bed    Wound Therapy - Clinical Statement  Patient's wound continues to present with adhered pale slough with spots of brown where the nail bed is. The spot on the lateral side of his toe in the inner phalangeal space has darkened but remains soft to touch. Slough was removed from wound along cuticle line nail bed. Surrounding periwound continues to be erythematous and remarked edges using marker which appears to be slightly reduced from last session. Applied xeroform to wound and wrapped with gauze and tape. He will continue to benefit from skilled wound care to promote healing.     Wound Therapy - Functional Problem List  walking, showering/bathing, infection risk    Factors Delaying/Impairing Wound Healing  Diabetes Mellitus;Multiple medical problems;Tobacco use;Vascular compromise   PAD, PVD, HTN, DM   Hydrotherapy Plan  Debridement;Dressing change;Patient/family education;Pulsatile lavage with suction;Other (comment)   low level laser therapy   Wound Therapy - Frequency  3X / week   3x/week for 3 weeks, 2x/week for weeks   Wound Therapy - Current Recommendations  PT    Wound Plan  Measure on Fridays (1x/week). Continue to debride necrotic tissue until well granualted with no signs of infection. Dress appropriately to manage adhered slough and drainage. Monitor dark spot on inner phalangeal area.    Dressing   xeroform, gauze, tape, netting       PT Short Term Goals - 06/12/18 1317      PT SHORT TERM GOAL #1   Title  Wound will be 100% red or granulated tissue with no slough, eshcar, or necrotic tissue present to indicate wound is moving into proliferative phase of healing.     Time  3    Period  Weeks    Status  New    Target Date  07/03/18      PT SHORT TERM GOAL #2   Title  Patient will no longer have edema or errythema present around periwound indicating no persisting signs of local infection.    Time  3    Period  Weeks    Status  New        PT Long Term Goals -  06/12/18 1320      PT LONG TERM GOAL #1   Title  Wound size with be 25% reduced for surface area indicating good proliferation of wound healing.     Time  3    Period  Weeks    Status  New    Target Date  07/24/18      PT LONG TERM GOAL #2   Title  Patient and daughter will demonstrate proper self care technique to  complete wound care management at home when appropriate and wound healing progressed enough.    Time  3    Period  Weeks    Status  New        Plan - 07/01/18 1117    Clinical Impression Statement  see above    Personal Factors and Comorbidities  Age;Comorbidity 3+    Examination-Activity Limitations  Bathing;Dressing;Carry;Lift;Locomotion Level    Examination-Participation Restrictions  Cleaning;Laundry;Yard Work;Community Activity;Driving;Shop    Stability/Clinical Decision Making  Evolving/Moderate complexity    Rehab Potential  Fair    PT Frequency  3x / week   3x/week for 3 weeks then 2x/week for 3 more weeks   PT Duration  6 weeks    PT Treatment/Interventions  ADLs/Self Care Home Management;DME Instruction;Manual techniques;Patient/family education;Therapeutic exercise;Taping;Other (comment)   skilled wound care   PT Next Visit Plan  see above    Consulted and Agree with Plan of Care  Patient       Patient will benefit from skilled therapeutic intervention in order to improve the following deficits and impairments:  Abnormal gait, Decreased balance, Decreased skin integrity, Pain, Increased edema, Decreased range of motion, Decreased mobility, Difficulty walking  Visit Diagnosis: 1. Open wound of right great toe with damage to nail, initial encounter   2. Localized edema        Problem List Patient Active Problem List   Diagnosis Date Noted  . Type 2 diabetes mellitus with foot ulcer, without long-term current use of insulin ([place])   . Gastroesophageal reflux disease   . Cellulitis of great toe of right foot 05/29/2018  . Atrial fibrillation,  chronic   . Chest pain 07/25/2017  . PAF (paroxysmal atrial fibrillation) ([place]) 07/25/2017  . BPH (benign prostatic hyperplasia) 07/25/2017  . Shortness of breath 05/11/2017  . Hypothyroidism 05/11/2017  . Chronic diastolic CHF (congestive heart failure) ([place]) 05/11/2017  . Aortic valve stenosis 06/12/2016  . RBBB 06/12/2016  . PAD (peripheral artery disease) ([place]) 08/04/2012  . Essential hypertension 08/04/2012  . Hyperlipidemia 08/04/2012  . Dizziness 08/04/2012    Kipp Brood, PT, DPT, [facility] Physical Therapist with [facility]  07/01/2018 11:18 AM    [place] [address] [place], [place], [address] Phone: [phone]   Fax:  [phone]  Name: [name] MRN: [id number] Date of Birth: 1927-10-02

## 2018-07-02 ENCOUNTER — Telehealth: Payer: Self-pay | Admitting: Internal Medicine

## 2018-07-02 NOTE — Telephone Encounter (Signed)
New Message    Patient's daughter returning your call.

## 2018-07-02 NOTE — Telephone Encounter (Signed)
Returned call to daughter. Patient has bilateral LE swelling and has toe infection on R foot. He was advised by another provider to take lasix 40mg  QD and has been for about 1 week (left has improved more than right). Daughter reports his toe infection is improving with wound care treatments and antibiotics. His weights have been stable. He has no SOB. He has no swelling elsewhere. Daughter states wound clinic advised he contact cardiology for f/up and assessment in case ongoing swelling is from cardiac cause.  Will routed to MD to review and advise if virtual visit can accomplish thorough assessment or if patient needs in-office

## 2018-07-02 NOTE — Telephone Encounter (Signed)
LMTCB

## 2018-07-02 NOTE — Telephone Encounter (Signed)
Probably an in office visit with me or APP.  Dr. Lemmie Evens

## 2018-07-02 NOTE — Telephone Encounter (Signed)
Spoke with daughter. Scheduled for in-office appt with Lurena Joiner PA on 6/24 @ 1:15pm. Daughter is aware that we are limiting persons in the office and she can bring patient to the lobby but he will need to attend the visit himself. She is aware that she can be called during the visit with PA so she can hear what is going on and communicate with provider as well.

## 2018-07-02 NOTE — Telephone Encounter (Signed)
° ° °  Pt c/o swelling: STAT is pt has developed SOB within 24 hours  1) How much weight have you gained and in what time span? none  2) If swelling, where is the swelling located? Feet and ankles  3) Are you currently taking a fluid pill? Lasix 40mg  1x daily  4) Are you currently SOB? no  5) Do you have a log of your daily weights (if so, list)? yes  6) Have you gained 3 pounds in a day or 5 pounds in a week? No. Weight has stayed consisten in a 5 pound range since hospital discharge. Daughter believes he steadies himself against a wall or his walker for fear of falling while weighing himself.   7) Have you traveled recently? No  Daughter called. Patient is receiving wound care for one of his toes. Wound Care team and Daughter noticed significant swelling in his feet and ankles. Wound care team would like him to see Dr. Debara Pickett to make sure that there is no other reason for the swelling.  Patient has also been on and off different antibiotics to help cure an infection in his toe.   Daughter states that he does not have good internet or cell phone service at his house. A virtual visit would have to be done at his Daughter's house.

## 2018-07-03 ENCOUNTER — Ambulatory Visit (HOSPITAL_COMMUNITY): Payer: Medicare Other

## 2018-07-03 ENCOUNTER — Other Ambulatory Visit: Payer: Self-pay

## 2018-07-03 ENCOUNTER — Encounter (HOSPITAL_COMMUNITY): Payer: Self-pay

## 2018-07-03 DIAGNOSIS — S91201A Unspecified open wound of right great toe with damage to nail, initial encounter: Secondary | ICD-10-CM | POA: Diagnosis not present

## 2018-07-03 DIAGNOSIS — R600 Localized edema: Secondary | ICD-10-CM

## 2018-07-03 NOTE — Therapy (Signed)
[place], [place], [address] Phone: [phone]   Fax:  [phone]  Wound Care Therapy Progress Note  Patient Details  Name: [name] MRN: [id number] Date of Birth: [birth year] Referring Provider (PT): [name], MD   Encounter Date: 07/03/2018   Progress Note Reporting Period 06/12/18 to 07/03/18  See note below for Objective Data and Assessment of Progress/Goals.     PT End of Session - 07/03/18 1411    Visit Number  9    Number of Visits  15    Date for PT Re-Evaluation  07/24/18    Authorization Type  [employer]HC Medicare    Authorization Time Period  06/12/18-07/24/18    Authorization - Visit Number  1    Authorization - Number of Visits  10    PT Start Time  9892    PT Stop Time  1404    PT Time Calculation (min)  49 min    Activity Tolerance  Patient tolerated treatment well    Behavior During Therapy  WFL for tasks assessed/performed       Past Medical History:  Diagnosis Date  . Arthritis   . Borderline diabetic   . Glaucoma   . Hyperlipidemia   . Hypertension   . PVD (peripheral vascular disease) ([place])   . Sleep apnea    Cpap ordered but doesnt use    Past Surgical History:  Procedure Laterality Date  . APPENDECTOMY  1943  . CATARACT EXTRACTION  2012   x2  . COLONOSCOPY N/A 11/01/2014   Procedure: COLONOSCOPY;  Surgeon: Aviva Signs, MD;  [facility];  Service: Gastroenterology;  Laterality: N/A;  . ESOPHAGOGASTRODUODENOSCOPY N/A 11/01/2014   Procedure: ESOPHAGOGASTRODUODENOSCOPY (EGD);  Surgeon: Aviva Signs, MD;  [facility];  Service: Gastroenterology;  Laterality: N/A;  . HERNIA REPAIR Left   . INGUINAL HERNIA REPAIR Right 03/01/2013   Procedure: RIGHT INGUINAL HERNIORRHAPHY;  Surgeon: Jamesetta So, MD;  Location: [facility];  Service: General;  Laterality: Right;  . INSERTION OF MESH Right 03/01/2013   Procedure: INSERTION OF MESH;  Surgeon: Jamesetta So, MD;  Location: [facility];  Service: General;  Laterality: Right;  . NM [place]  06/05/2010   no significant ischemia  . stent  06/14/2010   left leg  . US ECHOCARDIOGRAPHY  01/21/2006   moderate mitral annular ca+, mild MR, AOV moderately sclerotic.    There were no vitals filed for this visit.    Wound Therapy - 07/03/18 1414    Subjective  Patient arrives reporting 7/10 pain in his toe that is random. He reports occasional sharp pain and that his foot is always cold. He has a doctors appointment Tuesday with his cardiovascular doctor to assess blood flow to his Lt LE.    Patient and Family Stated Goals  Wound to heal    Date of Onset  05/07/18   Nail removed by Dr. Paulla Dolly   Pain Scale  0-10    Pain Score  7     Pain Type  Chronic pain    Pain Location  Toe (Comment which one)   right great toe   Pain Orientation  Right    Pain Descriptors / Indicators  Aching    Pain Onset  On-going    Patients Stated Pain Goal  0    Evaluation and Treatment Procedures Explained to Patient/Family  Yes    Evaluation and Treatment  Procedures  agreed to    Wound Properties Date First Assessed: 05/30/18 Time First Assessed: 2200 Wound Type: Other (Comment) Location: Toe (Comment  which one) , right great toe  Location Orientation: Right Wound Description (Comments): missing toenail Present on Admission: Yes   Dressing Type  Gauze (Comment);Impregnated gauze (bismuth)    Dressing Changed  Changed    Dressing Status  Old drainage    Dressing Change Frequency  PRN    Site / Wound Assessment  Brown;Black;Painful;Red;Yellow    % Wound base Red or Granulating  30%   includes inner phalangeal space redness   % Wound base Yellow/Fibrinous Exudate  40%   yellow and pale   % Wound base Black/Eschar  30%   darkening spot on inner phalangeal space   Peri-wound Assessment  Edema;Erythema (non-blanchable)   COLD   Wound Length (cm)  3.8 cm   3.5   Wound Width (cm)  5.1 cm   4.2    Wound Depth (cm)  0.1 cm   0.1   Wound Volume (cm^3)  1.94 cm^3    Wound Surface Area (cm^2)  19.38 cm^2    Margins  Unattached edges (unapproximated)    Drainage Amount  Minimal    Drainage Description  No odor;Serosanguineous    Treatment  Cleansed;Debridement (Selective)    Selective Debridement - Location  dry devitalized tissue and sloth    Selective Debridement - Tools Used  Forceps;Scissors    Selective Debridement - Tissue Removed  slough and adheared necrotic/devitalized tissue form wound bed    Wound Therapy - Clinical Statement  Patient continues to present with erythema and edema to periwound. His toes are cold to touch and pale/blue in coloration after ~ 10 minutes with foot elevated. Intermittent periods of foot resting in dependent position were added to today's session. Wound bed continues to have adhered slough present requiring debridement with forceps and scalpel today. Patient had less tenderness and sensitivity to debridement this session. Applied xeroform to wound and wrapped with gauze and tape. He will continue to benefit from skilled wound care to promote healing    Wound Therapy - Functional Problem List  walking, showering/bathing, infection risk    Factors Delaying/Impairing Wound Healing  Diabetes Mellitus;Multiple medical problems;Tobacco use;Vascular compromise   PAD, PVD, HTN, DM   Hydrotherapy Plan  Debridement;Dressing change;Patient/family education;Pulsatile lavage with suction;Other (comment)   low level laser therapy   Wound Therapy - Frequency  3X / week   3x/week for 3 weeks, 2x/week for weeks   Wound Therapy - Current Recommendations  PT    Wound Plan  Measure on Fridays (1x/week). Continue to debride necrotic tissue until well granualted with no signs of infection. Dress appropriately to manage adhered slough and drainage. Monitor dark spot on inner phalangeal area.    Dressing   xeroform, gauze, tape, netting        PT Short Term Goals - 07/03/18  1412      PT SHORT TERM GOAL #1   Title  Wound will be 100% red or granulated tissue with no slough, eshcar, or necrotic tissue present to indicate wound is moving into proliferative phase of healing.     Baseline  pt conitnues to have adherant slough and eschar to wound    Time  3    Period  Weeks    Status  On-going    Target Date  07/03/18      PT SHORT TERM GOAL #2   Title  Patient  will no longer have edema or errythema present around periwound indicating no persisting signs of local infection.    Baseline  errythema and edam persists    Time  3    Period  Weeks    Status  On-going        PT Long Term Goals - 07/03/18 1413      PT LONG TERM GOAL #1   Title  Wound size with be 25% reduced for surface area indicating good proliferation of wound healing.     Time  6    Period  Weeks    Status  On-going    Target Date  07/24/18      PT LONG TERM GOAL #2   Title  Patient and daughter will demonstrate proper self care technique to complete wound care management at home when appropriate and wound healing progressed enough.    Time  6    Period  Weeks    Status  On-going        Plan - 07/03/18 1412    Clinical Impression Statement  see above    Personal Factors and Comorbidities  Age;Comorbidity 3+    Examination-Activity Limitations  Bathing;Dressing;Carry;Lift;Locomotion Level    Examination-Participation Restrictions  Cleaning;Laundry;Yard Work;Community Activity;Driving;Shop    Stability/Clinical Decision Making  Evolving/Moderate complexity    Rehab Potential  Fair    PT Frequency  3x / week   3x/week for 3 weeks then 2x/week for 3 more weeks   PT Duration  6 weeks    PT Treatment/Interventions  ADLs/Self Care Home Management;DME Instruction;Manual techniques;Patient/family education;Therapeutic exercise;Taping;Other (comment)   skilled wound care   PT Next Visit Plan  see above    Consulted and Agree with Plan of Care  Patient       Patient will benefit from  skilled therapeutic intervention in order to improve the following deficits and impairments:  Abnormal gait, Decreased balance, Decreased skin integrity, Pain, Increased edema, Decreased range of motion, Decreased mobility, Difficulty walking  Visit Diagnosis: 1. Open wound of right great toe with damage to nail, initial encounter   2. Localized edema        Problem List Patient Active Problem List   Diagnosis Date Noted  . Type 2 diabetes mellitus with foot ulcer, without long-term current use of insulin ([place])   . Gastroesophageal reflux disease   . Cellulitis of great toe of right foot 05/29/2018  . Atrial fibrillation, chronic   . Chest pain 07/25/2017  . PAF (paroxysmal atrial fibrillation) ([place]) 07/25/2017  . BPH (benign prostatic hyperplasia) 07/25/2017  . Shortness of breath 05/11/2017  . Hypothyroidism 05/11/2017  . Chronic diastolic CHF (congestive heart failure) ([place]) 05/11/2017  . Aortic valve stenosis 06/12/2016  . RBBB 06/12/2016  . PAD (peripheral artery disease) ([place]) 08/04/2012  . Essential hypertension 08/04/2012  . Hyperlipidemia 08/04/2012  . Dizziness 08/04/2012    Kipp Brood, PT, DPT, [facility] Physical Therapist with [facility]  07/03/2018 5:23 PM    [place] [address] [place], [place], [address] Phone: [phone]   Fax:  [phone]  Name: [name] MRN: [id number] Date of Birth: May 10, 1927

## 2018-07-06 ENCOUNTER — Ambulatory Visit (HOSPITAL_COMMUNITY): Payer: Medicare Other

## 2018-07-06 ENCOUNTER — Other Ambulatory Visit: Payer: Self-pay

## 2018-07-06 ENCOUNTER — Encounter (HOSPITAL_COMMUNITY): Payer: Self-pay

## 2018-07-06 DIAGNOSIS — S91201A Unspecified open wound of right great toe with damage to nail, initial encounter: Secondary | ICD-10-CM | POA: Diagnosis not present

## 2018-07-06 DIAGNOSIS — R600 Localized edema: Secondary | ICD-10-CM

## 2018-07-06 NOTE — Therapy (Signed)
[place], [place], [address] Phone: [phone]   Fax:  [phone]  Wound Care Therapy  Patient Details  Name: [name] MRN: [id number] Date of Birth: Jan 15, 1927 Referring Provider (PT): [name], MD   Encounter Date: 07/06/2018  PT End of Session - 07/06/18 1559    Visit Number  10    Number of Visits  15    Date for PT Re-Evaluation  07/24/18    Authorization Type  [employer] Medicare    Authorization Time Period  06/12/18-07/24/18    Authorization - Visit Number  2    Authorization - Number of Visits  10    PT Start Time  1320    PT Stop Time  1400    PT Time Calculation (min)  40 min    Activity Tolerance  Patient tolerated treatment well    Behavior During Therapy  [facility] for tasks assessed/performed       Past Medical History:  Diagnosis Date  . Arthritis   . Borderline diabetic   . Glaucoma   . Hyperlipidemia   . Hypertension   . PVD (peripheral vascular disease) ([place])   . Sleep apnea    Cpap ordered but doesnt use    Past Surgical History:  Procedure Laterality Date  . APPENDECTOMY  1943  . CATARACT EXTRACTION  2012   x2  . COLONOSCOPY N/A 11/01/2014   Procedure: COLONOSCOPY;  Surgeon: Aviva Signs, MD;  [facility];  Service: Gastroenterology;  Laterality: N/A;  . ESOPHAGOGASTRODUODENOSCOPY N/A 11/01/2014   Procedure: ESOPHAGOGASTRODUODENOSCOPY (EGD);  Surgeon: Aviva Signs, MD;  Location: [facility];  Service: Gastroenterology;  Laterality: N/A;  . HERNIA REPAIR Left   . INGUINAL HERNIA REPAIR Right 03/01/2013   Procedure: RIGHT INGUINAL HERNIORRHAPHY;  Surgeon: Jamesetta So, MD;  Location: [facility] ORS;  Service: General;  Laterality: Right;  . INSERTION OF MESH Right 03/01/2013   Procedure: INSERTION OF MESH;  Surgeon: Jamesetta So, MD;  Location: [facility] ORS;  Service: General;  Laterality: Right;  . NM [place]  06/05/2010   no significant ischemia  . stent   06/14/2010   left leg  . US ECHOCARDIOGRAPHY  01/21/2006   moderate mitral annular ca+, mild MR, AOV moderately sclerotic.    There were no vitals filed for this visit.   Wound Therapy - 07/06/18 1543    Subjective  Patient arrives with dressing intact. He denies pain today and reports he has his doctors appointment on Wednesday.     Patient and Family Stated Goals  Wound to heal    Date of Onset  05/07/18   Nail removed by Dr. Paulla Dolly   Pain Scale  0-10    Pain Score  0-No pain    Evaluation and Treatment Procedures Explained to Patient/Family  Yes    Evaluation and Treatment Procedures  agreed to    Wound Properties Date First Assessed: 05/30/18 Time First Assessed: 2200 Wound Type: Other (Comment) Location: Toe (Comment  which one) , right great toe  Location Orientation: Right Wound Description (Comments): missing toenail Present on Admission: Yes   Dressing Type  Gauze (Comment);Impregnated gauze (bismuth)    Dressing Changed  Changed    Dressing Status  Old drainage    Dressing Change Frequency  PRN    Site / Wound Assessment  Brown;Black;Painful;Red;Yellow    % Wound base Red or Granulating  20%   includes inner phalangeal space  redness   % Wound base Yellow/Fibrinous Exudate  60%   yellow and pale   % Wound base Black/Eschar  20%   darkening spot on inner phalangeal space   Peri-wound Assessment  Edema;Erythema (non-blanchable)   COLD   Margins  Unattached edges (unapproximated)    Drainage Amount  Minimal    Drainage Description  No odor;Serosanguineous    Treatment  Cleansed;Debridement (Selective)    Selective Debridement - Location  dry devitalized tissue and sloth    Selective Debridement - Tools Used  Forceps;Scissors    Selective Debridement - Tissue Removed  slough and adheared necrotic/devitalized tissue form wound bed    Wound Therapy - Clinical Statement  Patient's wound is slow changing. Periwound continues to have erythema entire LE is edematous. Patient's toe  remains blue/pale in color and cold to palpation. This date slough more easily debrided and patient with reduced sensitivity to debridement. Able to remove portion of black devitalized tissue along lateral side of great toe revealing yellowish spongy tissue. Continued applying xeroform to wound and wrapped with gauze and tape. He will continue to benefit from skilled wound care to promote healing.    Wound Therapy - Functional Problem List  walking, showering/bathing, infection risk    Factors Delaying/Impairing Wound Healing  Diabetes Mellitus;Multiple medical problems;Tobacco use;Vascular compromise   PAD, PVD, HTN, DM   Hydrotherapy Plan  Debridement;Dressing change;Patient/family education;Pulsatile lavage with suction;Other (comment)   low level laser therapy   Wound Therapy - Frequency  3X / week   3x/week for 3 weeks, 2x/week for weeks   Wound Therapy - Current Recommendations  PT    Wound Plan  Measure on Fridays (1x/week). Continue to debride necrotic tissue until well granualted with no signs of infection. Dress appropriately to manage adhered slough and drainage. Monitor dark spot on inner phalangeal area.    Dressing   xeroform, gauze, tape, netting        PT Short Term Goals - 07/03/18 1412      PT SHORT TERM GOAL #1   Title  Wound will be 100% red or granulated tissue with no slough, eshcar, or necrotic tissue present to indicate wound is moving into proliferative phase of healing.     Baseline  pt conitnues to have adherant slough and eschar to wound    Time  3    Period  Weeks    Status  On-going    Target Date  07/03/18      PT SHORT TERM GOAL #2   Title  Patient will no longer have edema or errythema present around periwound indicating no persisting signs of local infection.    Baseline  errythema and edam persists    Time  3    Period  Weeks    Status  On-going        PT Long Term Goals - 07/03/18 1413      PT LONG TERM GOAL #1   Title  Wound size with be 25%  reduced for surface area indicating good proliferation of wound healing.     Time  6    Period  Weeks    Status  On-going    Target Date  07/24/18      PT LONG TERM GOAL #2   Title  Patient and daughter will demonstrate proper self care technique to complete wound care management at home when appropriate and wound healing progressed enough.    Time  6    Period  Weeks  Status  On-going       Plan - 07/06/18 1559    Clinical Impression Statement  see above    Personal Factors and Comorbidities  Age;Comorbidity 3+    Examination-Activity Limitations  Bathing;Dressing;Carry;Lift;Locomotion Level    Examination-Participation Restrictions  Cleaning;Laundry;Yard Work;Community Activity;Driving;Shop    Stability/Clinical Decision Making  Evolving/Moderate complexity    Rehab Potential  Fair    PT Frequency  3x / week   3x/week for 3 weeks then 2x/week for 3 more weeks   PT Duration  6 weeks    PT Treatment/Interventions  ADLs/Self Care Home Management;DME Instruction;Manual techniques;Patient/family education;Therapeutic exercise;Taping;Other (comment)   skilled wound care   PT Next Visit Plan  see above    Consulted and Agree with Plan of Care  Patient       Patient will benefit from skilled therapeutic intervention in order to improve the following deficits and impairments:  Abnormal gait, Decreased balance, Decreased skin integrity, Pain, Increased edema, Decreased range of motion, Decreased mobility, Difficulty walking  Visit Diagnosis: 1. Open wound of right great toe with damage to nail, initial encounter   2. Localized edema        Problem List Patient Active Problem List   Diagnosis Date Noted  . Type 2 diabetes mellitus with foot ulcer, without long-term current use of insulin ([place])   . Gastroesophageal reflux disease   . Cellulitis of great toe of right foot 05/29/2018  . Atrial fibrillation, chronic   . Chest pain 07/25/2017  . PAF (paroxysmal atrial  fibrillation) ([place]) 07/25/2017  . BPH (benign prostatic hyperplasia) 07/25/2017  . Shortness of breath 05/11/2017  . Hypothyroidism 05/11/2017  . Chronic diastolic CHF (congestive heart failure) ([place]) 05/11/2017  . Aortic valve stenosis 06/12/2016  . RBBB 06/12/2016  . PAD (peripheral artery disease) ([place]) 08/04/2012  . Essential hypertension 08/04/2012  . Hyperlipidemia 08/04/2012  . Dizziness 08/04/2012    Kipp Brood, PT, DPT, [facility] Physical Therapist with [facility]  07/06/2018 4:00 PM    [place] [address] [place], [place], [address] Phone: [phone]   Fax:  [phone]  Name: [name] MRN: [id number] Date of Birth: 07-28-1927

## 2018-07-08 ENCOUNTER — Other Ambulatory Visit: Payer: Self-pay

## 2018-07-08 ENCOUNTER — Encounter: Payer: Self-pay | Admitting: Medical

## 2018-07-08 ENCOUNTER — Ambulatory Visit: Payer: Medicare Other | Admitting: Medical

## 2018-07-08 VITALS — BP 173/56 | HR 55 | Temp 98.1°F | Ht 71.0 in | Wt 224.4 lb

## 2018-07-08 DIAGNOSIS — N183 Chronic kidney disease, stage 3 unspecified: Secondary | ICD-10-CM

## 2018-07-08 DIAGNOSIS — I10 Essential (primary) hypertension: Secondary | ICD-10-CM

## 2018-07-08 DIAGNOSIS — I350 Nonrheumatic aortic (valve) stenosis: Secondary | ICD-10-CM | POA: Diagnosis not present

## 2018-07-08 DIAGNOSIS — I5033 Acute on chronic diastolic (congestive) heart failure: Secondary | ICD-10-CM | POA: Diagnosis not present

## 2018-07-08 DIAGNOSIS — E782 Mixed hyperlipidemia: Secondary | ICD-10-CM

## 2018-07-08 DIAGNOSIS — I480 Paroxysmal atrial fibrillation: Secondary | ICD-10-CM | POA: Diagnosis not present

## 2018-07-08 DIAGNOSIS — I739 Peripheral vascular disease, unspecified: Secondary | ICD-10-CM

## 2018-07-08 MED ORDER — DOXAZOSIN MESYLATE 2 MG PO TABS: 2.0000 mg | ORAL_TABLET | Freq: Every day | ORAL | 3 refills | Status: DC

## 2018-07-08 MED ORDER — FUROSEMIDE 40 MG PO TABS: ORAL_TABLET | ORAL | 3 refills | Status: DC

## 2018-07-08 NOTE — Patient Instructions (Addendum)
Medication Instructions:  Take Lasix 40 Mg 2 times a day for 7 days then on the 8th day take 1 tablet daily Start Doxazosin 2 Mg Daily at bedtime  If you need a refill on your cardiac medications before your next appointment, please call your pharmacy.   Lab work: You will need to have labs (blood work) drawn today:  BMET  If you have labs (blood work) drawn today and your tests are completely normal, you will receive your results only by: [name] MyChart Message (if you have MyChart) OR . A paper copy in the mail If you have any lab test that is abnormal or we need to change your treatment, we will call you to review the results.  Testing/Procedures: Your physician has requested that you have an ankle brachial index (ABI). During this test an ultrasound and blood pressure cuff are used to evaluate the arteries that supply the arms and legs with blood. Allow thirty minutes for this exam. There are no restrictions or special instructions.  Your physician has requested that you have a lower extremity arterial exercise duplex. During this test, exercise and ultrasound are used to evaluate arterial blood flow in the legs. Allow one hour for this exam. There are no restrictions or special instructions.   SCHEDULE if possible same day of 1-2 week follow-up  Follow-Up: At [facility], you and your health needs are our priority.  As part of our continuing mission to provide you with exceptional heart care, we have created designated Provider Care Teams.  These Care Teams include your primary Cardiologist (physician) and Advanced Practice Providers (APPs -  Physician Assistants and Nurse Practitioners) who all work together to provide you with the care you need, when you need it. You will need a follow up appointment in 1-2 weeks.  Please call our office 2 months in advance to schedule this appointment.  You may see Pixie Casino, MD or one of the following Advanced Practice Providers on your  designated Care Team: [place], [place] . Fabian Sharp, PA-C  Any Other Special Instructions Will Be Listed Below (If Applicable).

## 2018-07-08 NOTE — Progress Notes (Signed)
Cardiology Office Note   Date:  07/10/2018   ID:  [name], DOB [birth year], MRN [id number]  PCP:  [name], MD  Cardiologist:  Pixie Casino, MD EP: None  Chief Complaint  Patient presents with  . Leg Swelling      History of Present Illness: [name] is a 85 y.o. male with PMH of HTN, HLD, PVD s/p stent to R SFA in 5277, chronic diastolic CHF, moderate aortic stenosis, paroxysmal atrial fibrillation on eliquis, DM type 2, hypothyroidism, BPH, CKD stage 3, and GERD, who presents for the evaluation of LE edema.  He was last evaluated by cardiology at an outpatient visit with Dr. Debara Pickett 11/2017. He had recently undergone an echocardiogram 11/2017 for monitoring his AS which revealed a mild decline but still moderate AS; also with EF 60-65%, G2DD, moderate concentric LVH, mild TR, and mild-moderate LAE. He was without cardiac complaints, BP was stable, and EKG with sinus rhythm at that visit. No medication changes occurred and he was recommended to undergo repeat echo in 1 year.   He was admitted to the hospital from 05/29/2018 - 06/01/2018 with purulent cellulitis after failing oral antibiotics. He has been undergoing wound care for right foot wound at home since discharge. Daughter and wound care RN noticed increased swelling in his feet/ankles and contacted our office 07/02/2018 for advice prompting this visit.   His daughter, [name], was called during this visit and assisted with history. He presents today and has had worsening edema for several weeks. His PCP transitioned his lasix to as needed ~1 month. She reported the patient has been taking lasix daily for the past 2 weeks with little improvement. No complaints of chest pain, SOB, DOE, orthopnea, PND, dizziness, lightheadedness, or syncope.     Past Medical History:  Diagnosis Date  . Arthritis   . Borderline diabetic   . Glaucoma   . Hyperlipidemia   . Hypertension   . PVD (peripheral vascular disease) ([place])    . Sleep apnea    Cpap ordered but doesnt use    Past Surgical History:  Procedure Laterality Date  . APPENDECTOMY  1943  . CATARACT EXTRACTION  2012   x2  . COLONOSCOPY N/A 11/01/2014   Procedure: COLONOSCOPY;  Surgeon: Aviva Signs, MD;  [facility];  Service: Gastroenterology;  Laterality: N/A;  . ESOPHAGOGASTRODUODENOSCOPY N/A 11/01/2014   Procedure: ESOPHAGOGASTRODUODENOSCOPY (EGD);  Surgeon: Aviva Signs, MD;  [facility];  Service: Gastroenterology;  Laterality: N/A;  . HERNIA REPAIR Left   . INGUINAL HERNIA REPAIR Right 03/01/2013   Procedure: RIGHT INGUINAL HERNIORRHAPHY;  Surgeon: Jamesetta So, MD;  Location: [facility] OR[facility];  Service: General;  Laterality: Right;  . INSERTION OF MESH Right 03/01/2013   Procedure: INSERTION OF MESH;  Surgeon: Jamesetta So, MD;  Location: [facility];  Service: General;  Laterality: Right;  . NM [place]  06/05/2010   no significant ischemia  . stent  06/14/2010   left leg  . US ECHOCARDIOGRAPHY  01/21/2006   moderate mitral annular ca+, mild MR, AOV moderately sclerotic.     Current Outpatient Medications  Medication Sig Dispense Refill  . apixaban (ELIQUIS) 2.5 MG TABS tablet Take 1 tablet (2.5 mg total) by mouth 2 (two) times daily. 180 tablet 3  . atorvastatin (LIPITOR) 40 MG tablet TAKE 1 TABLET BY MOUTH ONCE DAILY 90 tablet 3  . calcium carbonate (TUMS - DOSED IN MG ELEMENTAL CALCIUM) 500 MG  chewable tablet Chew 1 tablet by mouth as needed for indigestion or heartburn.    . cephALEXin (KEFLEX) 500 MG capsule Take 1 capsule by mouth 2 (two) times daily.    . dorzolamide-timolol (COSOPT) 22.3-6.8 MG/ML ophthalmic solution Place 1 drop into both eyes 2 (two) times daily.    [name] doxazosin (CARDURA) 2 MG tablet Take 1 tablet (2 mg total) by mouth at bedtime. 90 tablet 3  . finasteride (PROSCAR) 5 MG tablet Take 5 mg by mouth daily.    . furosemide (LASIX) 40 MG tablet TAKE 1 TABLET(40 MG) BY MOUTH DAILY 90 tablet 3   . glipiZIDE (GLUCOTROL) 5 MG tablet Take 5 mg by mouth 2 (two) times daily.     [name] glucosamine-chondroitin 500-400 MG tablet Take 3 tablets by mouth 2 (two) times daily.     [name] levothyroxine (SYNTHROID, LEVOTHROID) 50 MCG tablet Take 1 tablet by mouth daily.  0  . Methylcellulose, Laxative, (FIBER THERAPY PO) Take by mouth daily.    . metoprolol tartrate (LOPRESSOR) 25 MG tablet Take 12.5 mg as needed for palpitations/heart racing 30 tablet 1  . Omega-3 Fatty Acids (FISH OIL PO) Take 1 capsule by mouth daily.    [name] oxyCODONE-acetaminophen (PERCOCET) 5-325 MG tablet Take 1-2 tablets by mouth every 8 (eight) hours as needed for severe pain. 20 tablet 0  . pyridOXINE (VITAMIN B-6) 100 MG tablet Take 100 mg by mouth daily.    . ranitidine (ZANTAC) 150 MG tablet Take 150 mg by mouth at bedtime.    . tamsulosin (FLOMAX) 0.4 MG CAPS Take 0.4 mg by mouth 2 (two) times daily.     . vitamin C (ASCORBIC ACID) 500 MG tablet Take 500 mg by mouth daily.    . vitamin E 400 UNIT capsule Take 400 Units by mouth daily.     No current facility-administered medications for this visit.     Allergies:   Iodinated diagnostic agents, Iodine-131, and Iohexol    Social History:  The patient  reports that he quit smoking about 83 years ago. He has quit using smokeless tobacco.  His smokeless tobacco use included chew. He reports that he does not drink alcohol or use drugs.   Family History:  The patient's family history includes Cancer in his mother; Heart attack in his brother, brother, and father; Stroke in his father.    ROS:  Please see the history of present illness.   Otherwise, review of systems are positive for none.   All other systems are reviewed and negative.    PHYSICAL EXAM: VS:  BP (!) 173/56   Pulse (!) 55   Temp 98.1 F (36.7 C)   Ht 5\' 11"  (1.803 m)   Wt 224 lb 6.4 oz (101.8 kg)   SpO2 97%   BMI 31.30 kg/m  , BMI Body mass index is 31.3 kg/m. GEN: Well nourished, well developed, in no  acute distress HEENT: normal Neck: no JVD, carotid bruits, or masses Cardiac: RRR; +murmur, no rubs or gallops; 2-3+ LE edema  Respiratory: clear to auscultation bilaterally, normal work of breathing GI: soft, nontender, nondistended, + BS MS: no deformity or atrophy Skin: warm and dry, no rash Neuro:  Strength and sensation are intact Psych: euthymic mood, full affect   EKG:  EKG is not ordered today.    Recent Labs: 07/25/2017: Magnesium 2.0 05/29/2018: TSH 3.572 06/01/2018: Hemoglobin 10.9; Platelets 209 07/08/2018: BUN 16; Creatinine, Ser 1.27; Potassium 4.0; Sodium 133    Lipid Panel  Component Value Date/Time   CHOL 99 08/06/2012 1116   TRIG 89 08/06/2012 1116   HDL 39 (L) 08/06/2012 1116   LDLCALC 42 08/06/2012 1116      Wt Readings from Last 3 Encounters:  07/08/18 224 lb 6.4 oz (101.8 kg)  06/01/18 222 lb 14.2 oz (101.1 kg)  12/09/17 234 lb (106.1 kg)      Other studies Reviewed: Additional studies/ records that were reviewed today include:   Echocardiogram 11/2017: Study Conclusions  - Left ventricle: The cavity size was normal. There was moderate concentric hypertrophy. Systolic function was normal. The estimated ejection fraction was in the range of 60% to 65%. Wall motion was normal; there were no regional wall motion abnormalities. Features are consistent with a pseudonormal left ventricular filling pattern, with concomitant abnormal relaxation and increased filling pressure (grade 2 diastolic dysfunction). Doppler parameters are consistent with high ventricular filling pressure. - Aortic valve: Trileaflet; mildly thickened, moderately calcified leaflets. There was moderate stenosis. Valve area (VTI): 1.47 cm^2. Valve area (Vmax): 1.4 cm^2. Valve area (Vmean): 1.38 cm^2. - Mitral valve: Mildly calcified annulus. Mildly thickened leaflets. There was mild regurgitation. - Left atrium: The atrium was mildly to moderately dilated. - Tricuspid valve: There  was mild regurgitation. - Pulmonary arteries: PA peak pressure: 31 mm Hg (S).    ASSESSMENT AND PLAN:  1. Acute on chronic diastolic CHF: he presents with significant LE edema after temporary interruption in his diuretics.  - Increase lasix to 40mg  BID x1 week, then resume once daily dosing - Will check a BMET today for close monitoring of his renal function  2. Paroxysmal atrial fibrillation: rhythm sounds regular on exam today. HR 55. He reported taking one dose of his prn metoprolol yesterday evening for palpitations, which is a rare occurrence for him. No complaints of bleeding - Continue eliquis for stroke ppx - Continue prn metoprolol for rate control  3. HTN: BP elevated to 173/56. He is bradycardic at baseline limiting addition of BBlocker and CKD limits addition of ACEi/ARB. Amlodipine not ideal given significant LE edema at this time. Daughter reports relatively good blood pressures at home - Will start doxazosin 2mg  daily - Requested daughter keep a log of blood pressures over the next week   4. HLD: lipids followed by PCP - Continue atorvastatin  5. PVD s/p stent to R SFA 2012: followed by Dr. Gwenlyn Found, last seen 08/2016, at which time ABIs were stable and LE numbness felt to be 2/2 neuropathic vs neurologic in nature. He has a poorly healing RLE wound for which he has taken several round of antibiotics. Also with left groin pain - Will repeat ABI's as it has been a couple years since his last study.  - Continue statin  6. Aortic stenosis: moderate on echo 11/2017. Scheduled for repeat echo 11/2018. No complaints of chest pain, SOB, or syncope. - Continue routine echo's for close monitoring    Current medicines are reviewed at length with the patient today.  The patient does not have concerns regarding medicines.  The following changes have been made:  Increase lasix to 40mg  BID x7 days, then resume 40mg  daily dosing. Start doxazosin 2mg  daily  Labs/ tests ordered today  include:   Orders Placed This Encounter  Procedures  . Basic metabolic panel     Disposition:   FU with Dr. Rennie Plowman in 1-2 weeks  Signed, Abigail Butts, PA-C  07/10/2018 9:28 PM

## 2018-07-09 ENCOUNTER — Encounter (HOSPITAL_COMMUNITY): Payer: Self-pay | Admitting: Emergency Medicine

## 2018-07-09 ENCOUNTER — Emergency Department (HOSPITAL_COMMUNITY): Payer: Medicare Other

## 2018-07-09 ENCOUNTER — Emergency Department (HOSPITAL_COMMUNITY)
Admission: EM | Admit: 2018-07-09 | Discharge: 2018-07-09 | Disposition: A | Discharge status: Home / Self Care | Payer: Medicare Other | Attending: Emergency Medicine | Admitting: Emergency Medicine

## 2018-07-09 ENCOUNTER — Other Ambulatory Visit: Payer: Self-pay

## 2018-07-09 DIAGNOSIS — I110 Hypertensive heart disease with heart failure: Secondary | ICD-10-CM | POA: Diagnosis not present

## 2018-07-09 DIAGNOSIS — Z87891 Personal history of nicotine dependence: Secondary | ICD-10-CM | POA: Insufficient documentation

## 2018-07-09 DIAGNOSIS — L97509 Non-pressure chronic ulcer of other part of unspecified foot with unspecified severity: Secondary | ICD-10-CM | POA: Diagnosis not present

## 2018-07-09 DIAGNOSIS — E039 Hypothyroidism, unspecified: Secondary | ICD-10-CM | POA: Diagnosis not present

## 2018-07-09 DIAGNOSIS — S76211A Strain of adductor muscle, fascia and tendon of right thigh, initial encounter: Secondary | ICD-10-CM

## 2018-07-09 DIAGNOSIS — E11621 Type 2 diabetes mellitus with foot ulcer: Secondary | ICD-10-CM | POA: Insufficient documentation

## 2018-07-09 DIAGNOSIS — I5032 Chronic diastolic (congestive) heart failure: Secondary | ICD-10-CM | POA: Insufficient documentation

## 2018-07-09 DIAGNOSIS — R102 Pelvic and perineal pain unspecified side: Secondary | ICD-10-CM

## 2018-07-09 DIAGNOSIS — R1031 Right lower quadrant pain: Secondary | ICD-10-CM | POA: Insufficient documentation

## 2018-07-09 DIAGNOSIS — S76011A Strain of muscle, fascia and tendon of right hip, initial encounter: Secondary | ICD-10-CM | POA: Diagnosis not present

## 2018-07-09 DIAGNOSIS — M8580 Other specified disorders of bone density and structure, unspecified site: Secondary | ICD-10-CM | POA: Diagnosis not present

## 2018-07-09 DIAGNOSIS — R109 Unspecified abdominal pain: Secondary | ICD-10-CM | POA: Diagnosis not present

## 2018-07-09 LAB — BASIC METABOLIC PANEL
BUN/Creatinine Ratio: 13 (ref 10–24)
BUN: 16 mg/dL (ref 8–27)
CO2: 25 mmol/L (ref 20–29)
Calcium: 9.6 mg/dL (ref 8.6–10.2)
Chloride: 92 mmol/L — ABNORMAL LOW (ref 96–106)
Creatinine, Ser: 1.27 mg/dL (ref 0.76–1.27)
GFR calc Af Amer: 58 mL/min/{1.73_m2} — ABNORMAL LOW (ref >59)
GFR calc non Af Amer: 50 mL/min/{1.73_m2} — ABNORMAL LOW (ref >59)
Glucose: 86 mg/dL (ref 65–99)
Potassium: 4 mmol/L (ref 3.5–5.2)
Sodium: 133 mmol/L — ABNORMAL LOW (ref 134–144)

## 2018-07-09 MED ORDER — KETOROLAC TROMETHAMINE 60 MG/2ML IM SOLN
30.0000 mg | Freq: Once | INTRAMUSCULAR | Status: AC
  Administered 2018-07-09: 30 mg via INTRAMUSCULAR
  Filled 2018-07-09: qty 2

## 2018-07-09 MED ORDER — OXYCODONE-ACETAMINOPHEN 5-325 MG PO TABS: 1.0000 | ORAL_TABLET | Freq: Three times a day (TID) | ORAL | 0 refills | Status: DC | PRN

## 2018-07-09 MED ORDER — OXYCODONE-ACETAMINOPHEN 5-325 MG PO TABS
1.0000 | ORAL_TABLET | Freq: Once | ORAL | Status: AC
  Administered 2018-07-09: 1 via ORAL
  Filled 2018-07-09: qty 1

## 2018-07-09 MED ORDER — HYDROCODONE-ACETAMINOPHEN 5-325 MG PO TABS
1.0000 | ORAL_TABLET | Freq: Once | ORAL | Status: AC
  Administered 2018-07-09: 1 via ORAL
  Filled 2018-07-09: qty 1

## 2018-07-09 MED ORDER — METHOCARBAMOL 500 MG PO TABS
500.0000 mg | ORAL_TABLET | Freq: Once | ORAL | Status: AC
  Administered 2018-07-09: 500 mg via ORAL
  Filled 2018-07-09: qty 1

## 2018-07-09 NOTE — ED Triage Notes (Signed)
Pt c/o right sided groin pain that has gotten worse over last couple of days.

## 2018-07-09 NOTE — ED Provider Notes (Signed)
Emergency Department Provider Note   I have reviewed the triage vital signs and the nursing notes.   HISTORY  Chief Complaint Groin Pain   HPI [name] is a 85 y.o. male who presents the emerge department today with right upper medial thigh pain.  He states is been gone for couple weeks for the last few days is significantly worsened.  Very tender whenever he moves his leg or tries to bear weight.  No falls.  No urinary symptoms.  No GI symptoms.   No other associated or modifying symptoms.    Past Medical History:  Diagnosis Date  . Arthritis   . Borderline diabetic   . Glaucoma   . Hyperlipidemia   . Hypertension   . PVD (peripheral vascular disease) ([place])   . Sleep apnea    Cpap ordered but doesnt use    Patient Active Problem List   Diagnosis Date Noted  . Type 2 diabetes mellitus with foot ulcer, without long-term current use of insulin ([place])   . Gastroesophageal reflux disease   . Cellulitis of great toe of right foot 05/29/2018  . Atrial fibrillation, chronic   . Chest pain 07/25/2017  . PAF (paroxysmal atrial fibrillation) ([place]) 07/25/2017  . BPH (benign prostatic hyperplasia) 07/25/2017  . Shortness of breath 05/11/2017  . Hypothyroidism 05/11/2017  . Chronic diastolic CHF (congestive heart failure) ([place]) 05/11/2017  . Aortic valve stenosis 06/12/2016  . RBBB 06/12/2016  . PAD (peripheral artery disease) ([place]) 08/04/2012  . Essential hypertension 08/04/2012  . Hyperlipidemia 08/04/2012  . Dizziness 08/04/2012    Past Surgical History:  Procedure Laterality Date  . APPENDECTOMY  1943  . CATARACT EXTRACTION  2012   x2  . COLONOSCOPY N/A 11/01/2014   Procedure: COLONOSCOPY;  Surgeon: Aviva Signs, MD;  [facility];  Service: Gastroenterology;  Laterality: N/A;  . ESOPHAGOGASTRODUODENOSCOPY N/A 11/01/2014   Procedure: ESOPHAGOGASTRODUODENOSCOPY (EGD);  Surgeon: Aviva Signs, MD;  [facility];  Service: Gastroenterology;   Laterality: N/A;  . HERNIA REPAIR Left   . INGUINAL HERNIA REPAIR Right 03/01/2013   Procedure: RIGHT INGUINAL HERNIORRHAPHY;  Surgeon: Jamesetta So, MD;  Location: [facility];  Service: General;  Laterality: Right;  . INSERTION OF MESH Right 03/01/2013   Procedure: INSERTION OF MESH;  Surgeon: Jamesetta So, MD;  Location: [facility];  Service: General;  Laterality: Right;  . NM [place]  06/05/2010   no significant ischemia  . stent  06/14/2010   left leg  . US ECHOCARDIOGRAPHY  01/21/2006   moderate mitral annular ca+, mild MR, AOV moderately sclerotic.    Current Outpatient Rx  . Order #: [id number] Class: Normal  . Order #: [id number] Class: Normal  . Order #: [id number] Class: Historical Med  . Order #: [id number] Class: Historical Med  . Order #: [id number] Class: Historical Med  . Order #: [id number] Class: Normal  . Order #: [id number] Class: Historical Med  . Order #: [id number] Class: Normal  . Order #: [id number] Class: Historical Med  . Order #: [id number] Class: Historical Med  . Order #: [id number] Class: Historical Med  . Order #: [id number] Class: Historical Med  . Order #: [id number] Class: Print  . Order #: [id number] Class: Historical Med  . Order #: [id number] Class: Historical Med  . Order #: [id number] Class: Historical Med  . Order #: [id number] Class: Historical Med  . Order #: [id number] Class: Historical Med  . Order #: [id number] Class: Historical Med    Allergies Iodinated diagnostic agents, Iodine-131, and Iohexol  Family History  Problem Relation  Age of Onset  . Cancer Mother   . Heart attack Father   . Stroke Father   . Heart attack Brother   . Heart attack Brother     Social History Social History   Tobacco Use  . Smoking status: Former Smoker    Quit date: 01/14/1935    Years since quitting: 83.5  . Smokeless tobacco: Former Systems developer    Types: Chew  Substance Use Topics  . Alcohol use: No  . Drug use: No    Review of Systems  All other systems negative except as  documented in the HPI. All pertinent positives and negatives as reviewed in the HPI. ____________________________________________   PHYSICAL EXAM:  VITAL SIGNS: ED Triage Vitals [07/09/18 0349]  Enc Vitals Group     BP (!) 177/66     Pulse Rate 62     Resp 20     Temp 98.4 F (36.9 C)     Temp src      SpO2 98 %    Constitutional: Alert and oriented. Well appearing and in no acute distress. Eyes: Conjunctivae are normal. PERRL. EOMI. Head: Atraumatic. Nose: No congestion/rhinnorhea. Mouth/Throat: Mucous membranes are moist.  Oropharynx non-erythematous. Neck: No stridor.  No meningeal signs.   Cardiovascular: Normal rate, regular rhythm. Good peripheral circulation. Grossly normal heart sounds.   Respiratory: Normal respiratory effort.  No retractions. Lungs CTAB. Gastrointestinal: Soft and nontender. No distention.  Musculoskeletal: No lower extremity tenderness nor edema. No gross deformities of extremities. Has point tenderness over the right hip adductor muscles near insertion point. Worse with palpation, hip abduction and hip flexion.  Neurologic:  Normal speech and language. No gross focal neurologic deficits are appreciated.  Skin:  Skin is warm, dry and intact. No rash noted.   ____________________________________________  RADIOLOGY  Dg Hip Unilat With Pelvis 2-3 Views Right  Result Date: 07/09/2018 CLINICAL DATA:  Right-sided groin pain worsening for a few days, atraumatic. EXAM: DG HIP (WITH OR WITHOUT PELVIS) 2-3V RIGHT COMPARISON:  None. FINDINGS: There is no evidence of hip fracture or dislocation. There is no evidence of inflammatory arthropathy or bone lesion. Generalized enthesophyte formation. Mild bilateral hip spurring. Osteopenia and atherosclerosis. IMPRESSION: No acute or focal finding. Electronically Signed   By: Monte Fantasia M.D.   On: 07/09/2018 04:51    ____________________________________________   PROCEDURES  Procedure(s) performed:    Procedures   ____________________________________________   INITIAL IMPRESSION / ASSESSMENT AND PLAN / ED COURSE  No evidence of Fournier's gangrene or hernia or urinary causes for symptoms.  I suspect this is muscular in nature and will treat him appropriately.  Will need orthopedics follow-up, physical therapy and pain control at home.  Ambulates with walker.   Discussed with daughter who will help him get PT.  Pertinent labs & imaging results that were available during my care of the patient were reviewed by me and considered in my medical decision making (see chart for details).   A medical screening exam was performed and I feel the patient has had an appropriate workup for their chief complaint at this time and likelihood of emergent condition existing is low. They have been counseled on decision, discharge, follow up and which symptoms necessitate immediate return to the emergency department. They or their family verbally stated understanding and agreement with plan and discharged in stable condition.   ____________________________________________  FINAL CLINICAL IMPRESSION(S) / ED DIAGNOSES  Final diagnoses:  Pain in pelvis     MEDICATIONS GIVEN DURING THIS  VISIT:  Medications  HYDROcodone-acetaminophen (NORCO/VICODIN) 5-325 MG per tablet 1 tablet (1 tablet Oral Given 07/09/18 0410)  ketorolac (TORADOL) injection 30 mg (30 mg Intramuscular Given 07/09/18 0411)  methocarbamol (ROBAXIN) tablet 500 mg (500 mg Oral Given 07/09/18 0410)  oxyCODONE-acetaminophen (PERCOCET/ROXICET) 5-325 MG per tablet 1 tablet (1 tablet Oral Given 07/09/18 0600)     NEW OUTPATIENT MEDICATIONS STARTED DURING THIS VISIT:  New Prescriptions   No medications on file    Note:  This note was prepared with assistance of Dragon voice recognition software. Occasional wrong-word or sound-a-like substitutions may have occurred due to the inherent limitations of voice recognition software.   Ashar Lewinski,  Corene Cornea, MD 07/10/18 0600

## 2018-07-10 ENCOUNTER — Encounter: Payer: Self-pay | Admitting: Medical

## 2018-07-10 ENCOUNTER — Encounter (HOSPITAL_COMMUNITY): Payer: Self-pay

## 2018-07-10 ENCOUNTER — Telehealth (HOSPITAL_COMMUNITY): Payer: Self-pay

## 2018-07-10 ENCOUNTER — Ambulatory Visit (HOSPITAL_COMMUNITY): Payer: Medicare Other

## 2018-07-10 DIAGNOSIS — S91201A Unspecified open wound of right great toe with damage to nail, initial encounter: Secondary | ICD-10-CM

## 2018-07-10 DIAGNOSIS — R600 Localized edema: Secondary | ICD-10-CM | POA: Diagnosis not present

## 2018-07-10 NOTE — Telephone Encounter (Signed)
I called and spoke with Pt's daughter [name] as he did not arrive for his 11:15 AM appointment. She said she got the time mixed up from another schedule and thought it was at 2:15PM today. I gave her the 1:15 PM appointment and she will be able to bring him then.   Kipp Brood, PT, DPT, [facility] Physical Therapist with [facility]  07/10/2018 11:37 AM

## 2018-07-10 NOTE — Therapy (Signed)
[place], [place], [address] Phone: [phone]   Fax:  [phone]  Wound Care Therapy  Patient Details  Name: [name] MRN: [id number] Date of Birth: 08/29/1927 Referring Provider (PT): [name], MD   Encounter Date: 07/10/2018  PT End of Session - 07/10/18 1420    Visit Number  11    Number of Visits  15    Date for PT Re-Evaluation  07/24/18    Authorization Type  [employer] Medicare    Authorization Time Period  06/12/18-07/24/18    Authorization - Visit Number  3    Authorization - Number of Visits  10    PT Start Time  0254    PT Stop Time  1404    PT Time Calculation (min)  42 min    Activity Tolerance  Patient tolerated treatment well    Behavior During Therapy  [facility] for tasks assessed/performed       Past Medical History:  Diagnosis Date  . Arthritis   . Borderline diabetic   . Glaucoma   . Hyperlipidemia   . Hypertension   . PVD (peripheral vascular disease) ([place])   . Sleep apnea    Cpap ordered but doesnt use    Past Surgical History:  Procedure Laterality Date  . APPENDECTOMY  1943  . CATARACT EXTRACTION  2012   x2  . COLONOSCOPY N/A 11/01/2014   Procedure: COLONOSCOPY;  Surgeon: Aviva Signs, MD;  [facility];  Service: Gastroenterology;  Laterality: N/A;  . ESOPHAGOGASTRODUODENOSCOPY N/A 11/01/2014   Procedure: ESOPHAGOGASTRODUODENOSCOPY (EGD);  Surgeon: Aviva Signs, MD;  [facility];  Service: Gastroenterology;  Laterality: N/A;  . HERNIA REPAIR Left   . INGUINAL HERNIA REPAIR Right 03/01/2013   Procedure: RIGHT INGUINAL HERNIORRHAPHY;  Surgeon: Jamesetta So, MD;  Location: [facility];  Service: General;  Laterality: Right;  . INSERTION OF MESH Right 03/01/2013   Procedure: INSERTION OF MESH;  Surgeon: Jamesetta So, MD;  Location: [facility];  Service: General;  Laterality: Right;  . NM [place]  06/05/2010   no significant ischemia  . stent   06/14/2010   left leg  . US ECHOCARDIOGRAPHY  01/21/2006   moderate mitral annular ca+, mild MR, AOV moderately sclerotic.    There were no vitals filed for this visit.   Wound Therapy - 07/10/18 1411    Subjective  Patient arrives with daughter. They both report he went to cardiovascular MD on Wednesday and they have ordered ABI and arterial testing. They appointments for that are July 22nd. Patient reports he went to ED yesterday because of Rt hip pain but they told him is is muscular in nature and should improve with time.     Patient and Family Stated Goals  Wound to heal    Date of Onset  05/07/18   Nail removed by Dr. Paulla Dolly   Pain Scale  Faces    Faces Pain Scale  Hurts a little bit    Pain Type  Acute pain    Pain Location  Groin    Pain Orientation  Right;Anterior    Pain Descriptors / Indicators  Aching    Pain Onset  With Activity    Patients Stated Pain Goal  0    Evaluation and Treatment Procedures Explained to Patient/Family  Yes    Evaluation and Treatment Procedures  agreed to    Wound Properties Date First Assessed: 05/30/18 Time  First Assessed: 2200 Wound Type: Other (Comment) Location: Toe (Comment  which one) , right great toe  Location Orientation: Right Wound Description (Comments): missing toenail Present on Admission: Yes   Dressing Type  Gauze (Comment);Impregnated gauze (bismuth)    Dressing Status  Old drainage    Dressing Change Frequency  PRN    Site / Wound Assessment  Brown;Black;Painful;Red;Yellow    % Wound base Red or Granulating  30%   includes inner phalangeal space redness   % Wound base Yellow/Fibrinous Exudate  50%   yellow and pale   % Wound base Black/Eschar  20%   darkening spot on inner phalangeal space   Peri-wound Assessment  Edema;Erythema (non-blanchable);Maceration   COLD (pale maceration on inferior side of great toe)   Wound Length (cm)  4.2 cm   3.8   Wound Width (cm)  4.8 cm   5.1   Wound Depth (cm)  0.1 cm   .02   Wound  Volume (cm^3)  2.02 cm^3    Wound Surface Area (cm^2)  20.16 cm^2    Margins  Unattached edges (unapproximated)    Drainage Amount  Scant    Drainage Description  No odor;Serosanguineous    Selective Debridement - Location  dry devitalized tissue and sloth    Selective Debridement - Tools Used  Forceps;Scissors    Selective Debridement - Tissue Removed  slough and adheared necrotic/devitalized tissue form wound bed    Wound Therapy - Clinical Statement  Patient's wound with little change since last session. Patient's foot is warm and puffy at start of session especially around toes and forefoot proximal to toes. After ~ 5 minutes of elevation for treatment patient's Rt great toe is cold to touch. Slough around nail bed along cuticle line is easily debrided today and tissue along lateral side of toe on inner phalangeal space remains spongy. Patient continues to have LE swelling and his Lasix has been adjusted to help reduce swelling. We are unable to wrap the Rt LE with compression as it is still unclear what the arterial flow to the great toe is and patient needs to have ABI's to assess. He is scheduled for this in July. Continued applying xeroform to wound bed and applied alginate on inferior side of toe; wrapped with gauze and tape. He will continue to benefit from skilled wound care to promote healing.    Wound Therapy - Functional Problem List  walking, showering/bathing, infection risk    Factors Delaying/Impairing Wound Healing  Diabetes Mellitus;Multiple medical problems;Tobacco use;Vascular compromise   PAD, PVD, HTN, DM   Hydrotherapy Plan  Debridement;Dressing change;Patient/family education;Pulsatile lavage with suction;Other (comment)   low level laser therapy   Wound Therapy - Frequency  3X / week   3x/week for 3 weeks, 2x/week for weeks   Wound Therapy - Current Recommendations  PT    Wound Plan  Measure on Fridays (1x/week). Continue to debride necrotic tissue until well granualted  with no signs of infection. Dress appropriately to manage adhered slough and drainage. Monitor dark spot on inner phalangeal area.    Dressing   xeroform, gauze, tape, netting        PT Short Term Goals - 07/03/18 1412      PT SHORT TERM GOAL #1   Title  Wound will be 100% red or granulated tissue with no slough, eshcar, or necrotic tissue present to indicate wound is moving into proliferative phase of healing.     Baseline  pt conitnues to have adherant  slough and eschar to wound    Time  3    Period  Weeks    Status  On-going    Target Date  07/03/18      PT SHORT TERM GOAL #2   Title  Patient will no longer have edema or errythema present around periwound indicating no persisting signs of local infection.    Baseline  errythema and edam persists    Time  3    Period  Weeks    Status  On-going        PT Long Term Goals - 07/03/18 1413      PT LONG TERM GOAL #1   Title  Wound size with be 25% reduced for surface area indicating good proliferation of wound healing.     Time  6    Period  Weeks    Status  On-going    Target Date  07/24/18      PT LONG TERM GOAL #2   Title  Patient and daughter will demonstrate proper self care technique to complete wound care management at home when appropriate and wound healing progressed enough.    Time  6    Period  Weeks    Status  On-going       Plan - 07/10/18 1420    Clinical Impression Statement  see above    Personal Factors and Comorbidities  Age;Comorbidity 3+    Examination-Activity Limitations  Bathing;Dressing;Carry;Lift;Locomotion Level    Examination-Participation Restrictions  Cleaning;Laundry;Yard Work;Community Activity;Driving;Shop    Stability/Clinical Decision Making  Evolving/Moderate complexity    Rehab Potential  Fair    PT Frequency  3x / week   3x/week for 3 weeks then 2x/week for 3 more weeks   PT Duration  6 weeks    PT Treatment/Interventions  ADLs/Self Care Home Management;DME Instruction;Manual  techniques;Patient/family education;Therapeutic exercise;Taping;Other (comment)   skilled wound care   PT Next Visit Plan  see above    Consulted and Agree with Plan of Care  Patient       Patient will benefit from skilled therapeutic intervention in order to improve the following deficits and impairments:  Abnormal gait, Decreased balance, Decreased skin integrity, Pain, Increased edema, Decreased range of motion, Decreased mobility, Difficulty walking  Visit Diagnosis: 1. Open wound of right great toe with damage to nail, initial encounter   2. Localized edema        Problem List Patient Active Problem List   Diagnosis Date Noted  . Type 2 diabetes mellitus with foot ulcer, without long-term current use of insulin ([place])   . Gastroesophageal reflux disease   . Cellulitis of great toe of right foot 05/29/2018  . Atrial fibrillation, chronic   . Chest pain 07/25/2017  . PAF (paroxysmal atrial fibrillation) ([place]) 07/25/2017  . BPH (benign prostatic hyperplasia) 07/25/2017  . Shortness of breath 05/11/2017  . Hypothyroidism 05/11/2017  . Chronic diastolic CHF (congestive heart failure) ([place]) 05/11/2017  . Aortic valve stenosis 06/12/2016  . RBBB 06/12/2016  . PAD (peripheral artery disease) ([place]) 08/04/2012  . Essential hypertension 08/04/2012  . Hyperlipidemia 08/04/2012  . Dizziness 08/04/2012    Kipp Brood, PT, DPT, [facility] Physical Therapist with [facility]  07/10/2018 2:21 PM    [place] [address] [place], [place], [address] Phone: [phone]   Fax:  [phone]  Name: EHAN FREAS MRN: [id number] Date of Birth: 31-Mar-1927

## 2018-07-13 ENCOUNTER — Other Ambulatory Visit: Payer: Self-pay

## 2018-07-13 ENCOUNTER — Ambulatory Visit (HOSPITAL_COMMUNITY): Payer: Medicare Other | Admitting: Physical Therapy

## 2018-07-13 DIAGNOSIS — R600 Localized edema: Secondary | ICD-10-CM | POA: Diagnosis not present

## 2018-07-13 DIAGNOSIS — S91201A Unspecified open wound of right great toe with damage to nail, initial encounter: Secondary | ICD-10-CM

## 2018-07-13 NOTE — Therapy (Signed)
[place], [place], [address] Phone: [phone]   Fax:  [phone]  Wound Care Therapy  Patient Details  Name: [name] MRN: [id number] Date of Birth: 1927/03/16 Referring Provider (PT): [name], MD   Encounter Date: 07/13/2018  PT End of Session - 07/13/18 1333    Visit Number  12    Number of Visits  15    Date for PT Re-Evaluation  07/24/18    Authorization Type  [employer] Medicare    Authorization Time Period  06/12/18-07/24/18    Authorization - Visit Number  4    Authorization - Number of Visits  10    PT Start Time  1030    PT Stop Time  1105    PT Time Calculation (min)  35 min    Activity Tolerance  Patient tolerated treatment well    Behavior During Therapy  [facility] for tasks assessed/performed       Past Medical History:  Diagnosis Date  . Arthritis   . Borderline diabetic   . Glaucoma   . Hyperlipidemia   . Hypertension   . PVD (peripheral vascular disease) ([place])   . Sleep apnea    Cpap ordered but doesnt use    Past Surgical History:  Procedure Laterality Date  . APPENDECTOMY  1943  . CATARACT EXTRACTION  2012   x2  . COLONOSCOPY N/A 11/01/2014   Procedure: COLONOSCOPY;  Surgeon: Aviva Signs, MD;  Location: [facility];  Service: Gastroenterology;  Laterality: N/A;  . ESOPHAGOGASTRODUODENOSCOPY N/A 11/01/2014   Procedure: ESOPHAGOGASTRODUODENOSCOPY (EGD);  Surgeon: Aviva Signs, MD;  Location: [facility];  Service: Gastroenterology;  Laterality: N/A;  . HERNIA REPAIR Left   . INGUINAL HERNIA REPAIR Right 03/01/2013   Procedure: RIGHT INGUINAL HERNIORRHAPHY;  Surgeon: Jamesetta So, MD;  Location: [facility] ORS;  Service: General;  Laterality: Right;  . INSERTION OF MESH Right 03/01/2013   Procedure: INSERTION OF MESH;  Surgeon: Jamesetta So, MD;  Location: [facility] ORS;  Service: General;  Laterality: Right;  . NM [place]  06/05/2010   no significant ischemia  . stent   06/14/2010   left leg  . US ECHOCARDIOGRAPHY  01/21/2006   moderate mitral annular ca+, mild MR, AOV moderately sclerotic.    There were no vitals filed for this visit.              Wound Therapy - 07/13/18 1327    Subjective  pt states his groin is killing him.  Daughter also reports MD wants him to have therapy for his painful groin.      Patient and Family Stated Goals  Wound to heal    Date of Onset  05/07/18   Nail removed by Dr. Paulla Dolly   Pain Scale  Faces    Faces Pain Scale  Hurts even more    Pain Type  Acute pain    Pain Location  Groin    Pain Orientation  Right;Anterior    Pain Descriptors / Indicators  Aching    Pain Onset  With Activity    Evaluation and Treatment Procedures Explained to Patient/Family  Yes    Evaluation and Treatment Procedures  agreed to    Wound Properties Date First Assessed: 05/30/18 Time First Assessed: 2200 Wound Type: Other (Comment) Location: Toe (Comment  which one) , right great toe  Location Orientation: Right Wound Description (Comments): missing toenail Present on Admission: Yes  Dressing Type  Gauze (Comment);Impregnated gauze (bismuth)    Dressing Changed  Changed    Dressing Status  Old drainage    Dressing Change Frequency  PRN    Site / Wound Assessment  Brown;Black;Painful;Red;Yellow    % Wound base Red or Granulating  30%   includes inner phalangeal space redness   % Wound base Yellow/Fibrinous Exudate  50%   yellow and pale   % Wound base Black/Eschar  20%   darkening spot on inner phalangeal space   Peri-wound Assessment  Edema;Erythema (non-blanchable);Maceration   COLD (pale maceration on inferior side of great toe)   Margins  Unattached edges (unapproximated)    Drainage Amount  Minimal    Drainage Description  No odor;Serosanguineous    Treatment  Cleansed;Debridement (Selective)    Selective Debridement - Location  dry devitalized tissue and sloth    Selective Debridement - Tools Used  Forceps;Scissors     Selective Debridement - Tissue Removed  slough and adheared necrotic/devitalized tissue form wound bed    Wound Therapy - Clinical Statement  Noted increased redness slightly beyond margins, continued moltled appearance with purplish and green hues.  Plantar surface of great toe also with increased maceration and greenish tinge.  Had evaluating therapist do inspection and agreed MD would be called to discuss to see if ABI could get moved up, possible MRI to R/O infection and further instructions for care.  Edema with induration present in distal Rt LE (some also in Lt but Rt is much worse) and edema into toes.  Tip of Great toe cold to touch.  Discussed concerns with daughhter as well.  Used silver hydro only this session as toe was macerated today.  to f/u with MD's regarding completing needed studies asap.      Wound Therapy - Functional Problem List  walking, showering/bathing, infection risk    Factors Delaying/Impairing Wound Healing  Diabetes Mellitus;Multiple medical problems;Tobacco use;Vascular compromise   PAD, PVD, HTN, DM   Hydrotherapy Plan  Debridement;Dressing change;Patient/family education;Pulsatile lavage with suction;Other (comment)   low level laser therapy   Wound Therapy - Frequency  3X / week   3x/week for 3 weeks, 2x/week for weeks   Wound Therapy - Current Recommendations  PT    Wound Plan  Measure on Fridays (1x/week). Continue to debride necrotic tissue until well granualted with no signs of infection. Dress appropriately to manage adhered slough and drainage. Monitor dark spot on inner phalangeal area.    Dressing   silver hydrofiber, gauze, tape, netting                PT Short Term Goals - 07/03/18 1412      PT SHORT TERM GOAL #1   Title  Wound will be 100% red or granulated tissue with no slough, eshcar, or necrotic tissue present to indicate wound is moving into proliferative phase of healing.     Baseline  pt conitnues to have adherant slough and eschar to  wound    Time  3    Period  Weeks    Status  On-going    Target Date  07/03/18      PT SHORT TERM GOAL #2   Title  Patient will no longer have edema or errythema present around periwound indicating no persisting signs of local infection.    Baseline  errythema and edam persists    Time  3    Period  Weeks    Status  On-going  PT Long Term Goals - 07/03/18 1413      PT LONG TERM GOAL #1   Title  Wound size with be 25% reduced for surface area indicating good proliferation of wound healing.     Time  6    Period  Weeks    Status  On-going    Target Date  07/24/18      PT LONG TERM GOAL #2   Title  Patient and daughter will demonstrate proper self care technique to complete wound care management at home when appropriate and wound healing progressed enough.    Time  6    Period  Weeks    Status  On-going              Patient will benefit from skilled therapeutic intervention in order to improve the following deficits and impairments:     Visit Diagnosis: 1. Open wound of right great toe with damage to nail, initial encounter   2. Localized edema        Problem List Patient Active Problem List   Diagnosis Date Noted  . Type 2 diabetes mellitus with foot ulcer, without long-term current use of insulin ([place])   . Gastroesophageal reflux disease   . Cellulitis of great toe of right foot 05/29/2018  . Atrial fibrillation, chronic   . Chest pain 07/25/2017  . PAF (paroxysmal atrial fibrillation) ([place]) 07/25/2017  . BPH (benign prostatic hyperplasia) 07/25/2017  . Shortness of breath 05/11/2017  . Hypothyroidism 05/11/2017  . Chronic diastolic CHF (congestive heart failure) ([place]) 05/11/2017  . Aortic valve stenosis 06/12/2016  . RBBB 06/12/2016  . PAD (peripheral artery disease) ([place]) 08/04/2012  . Essential hypertension 08/04/2012  . Hyperlipidemia 08/04/2012  . Dizziness 08/04/2012   Teena Irani, PTA/CLT [phone]  Teena Irani 07/13/2018,  1:34 PM  [place] [address] [place], [place], [address] Phone: [phone]   Fax:  [phone]  Name: ADARRYL GOLDAMMER MRN: [id number] Date of Birth: 05/24/1927

## 2018-07-15 ENCOUNTER — Telehealth: Payer: Self-pay | Admitting: Internal Medicine

## 2018-07-15 ENCOUNTER — Ambulatory Visit (INDEPENDENT_AMBULATORY_CARE_PROVIDER_SITE_OTHER): Payer: Medicare Other | Admitting: Cardiovascular Disease

## 2018-07-15 ENCOUNTER — Telehealth: Payer: Self-pay

## 2018-07-15 ENCOUNTER — Other Ambulatory Visit: Payer: Self-pay

## 2018-07-15 ENCOUNTER — Ambulatory Visit (HOSPITAL_COMMUNITY): Payer: Medicare Other | Admitting: Physical Therapy

## 2018-07-15 ENCOUNTER — Encounter: Payer: Self-pay | Admitting: Cardiovascular Disease

## 2018-07-15 DIAGNOSIS — I739 Peripheral vascular disease, unspecified: Secondary | ICD-10-CM | POA: Diagnosis not present

## 2018-07-15 NOTE — Telephone Encounter (Signed)
Office Visit with Dr. Gwenlyn Found 07/15/2018.  Advised pt in office today to follow up with Roby Lofts, PA-C regarding Lasix dosing. Patient was advised during 6/24 appointment with Kroeger, PA-C to "Increase lasix to 40mg  BID x7 days, then resume 40mg  daily dosing". Patient daughter wanted to confirm if pt should continue 40 mg daily. Dr. Gwenlyn Found would like patient to follow up with PA that pt last saw pt for recommendations regarding Lasix dosing.

## 2018-07-15 NOTE — Telephone Encounter (Signed)
New Message     Apolonio Schneiders is calling from [facility] and says the  Wound has redness that is spreading and the color is grayish and green. Apolonio Schneiders would like for the pt to be seen for the testing sooner then what is scheduled   Please call

## 2018-07-15 NOTE — Patient Instructions (Signed)
Medication Instructions:  Your physician recommends that you continue on your current medications as directed. Please refer to the Current Medication list given to you today.  If you need a refill on your cardiac medications before your next appointment, please call your pharmacy.   Lab work: NONE If you have labs (blood work) drawn today and your tests are completely normal, you will receive your results only by: [name] MyChart Message (if you have MyChart) OR . A paper copy in the mail If you have any lab test that is abnormal or we need to change your treatment, we will call you to review the results.  Testing/Procedures: Your physician has requested that you have a lower or upper extremity arterial duplex. This test is an ultrasound of the arteries in the legs or arms. It looks at arterial blood flow in the legs and arms. Allow one hour for Lower and Upper Arterial scans. There are no restrictions or special instructions. YOU ARE SCHEDULED FOR 08/04/2018 AT 1:00PM. PLEASE SEE PERSONNEL AT CHECK OUT COUNTER FOR POSSIBLE SOONER APPOINTMENT.  Your physician has requested that you have an ankle brachial index (ABI). During this test an ultrasound and blood pressure cuff are used to evaluate the arteries that supply the arms and legs with blood. Allow thirty minutes for this exam. There are no restrictions or special instructions. YOU ARE SCHEDULED FOR 08/04/2018 AT 3:00PM. PLEASE SEE PERSONNEL AT CHECK OUT COUNTER FOR POSSIBLE SOONER APPOINTMENT.   Follow-Up: At [facility], you and your health needs are our priority.  As part of our continuing mission to provide you with exceptional heart care, we have created designated Provider Care Teams.  These Care Teams include your primary Cardiologist (physician) and Advanced Practice Providers (APPs -  Physician Assistants and Nurse Practitioners) who all work together to provide you with the care you need, when you need it. . You may schedule a follow up  appointment AS NEEDED UNLESS YOUR RESULTS ARE ABNORMAL. You may see Dr. Gwenlyn Found or one of the following Advanced Practice Providers on your designated Care Team:   . Kerin Ransom, [place] . Almyra Deforest, PA-C . Fabian Sharp, PA-C . Jory Sims, DNP . Rosaria Ferries, PA-C . Roby Lofts, PA-C . Sande Rives, PA-C

## 2018-07-15 NOTE — Telephone Encounter (Signed)
  Spoke with rehab she states that pt has been coming to rehab for a couple of months and states that pt's toe and foot is cold to the touch after exercising foot will warm up but toe stays cold and gray'ish in color and with green discharge . She thinks that this may be arterial and that if he waits any longer this may present an issue for pt.   S/w daughter (daughter, jama Ronnald Ramp, [phone] cell) she states that she will bring pt to scheduled appt today. COVID questions negative she will arrive early for check-in process.  COVID-19 Pre-Screening Questions:  . In the past 7 to 10 days have you had a cough,  shortness of breath, headache, congestion, fever (100 or greater) body aches, chills, sore throat, or sudden loss of taste or sense of smell?  NO . Have you been around anyone with known Covid 19. NO . Have you been around anyone who is awaiting Covid 19 test results in the past 7 to 10 days?NO . Have you been around anyone who has been exposed to Covid 19, or has mentioned symptoms of Covid 19 within the past 7 to 10 days? NO  Notified Rachael at [facility] that pt will be here for appt therefore will not be at her scheduled appt today, she will cancel appt.

## 2018-07-15 NOTE — Assessment & Plan Note (Signed)
[name] was added on today for critical limb ischemia.  He is got a gangrenous right great toe status post toenail removal by Dr. Paulla Dolly back in May.  Is had multiple treatments with antibiotics since that time.  He has had left SFA intervention by myself back in 2012 with Dopplers performed 08/20/2016 revealing a right ABI 0.82 and a left 2.84.  I am going to repeat lower extremity arterial Doppler studies to assess adequacy of perfusion for healing.

## 2018-07-15 NOTE — Progress Notes (Signed)
07/15/2018 [name]   [birth year]  [id number]  Primary Physician Travis Sites, MD Primary Cardiologist: Travis Harp MD [name], [place]  HPI:  [name] is a 85 y.o.  accompanied by his daughter [name] today. He was last seen by 08/20/2016.  He has a history of hypertension, hyperlipidemia, obstructive sleep apnea and peripheral vascular disease. I apparently stented his left SFA May 2012. He complained of numbness in his lower extremities specifically his right foot and left leg. His Dopplers performed 08/20/2016 revealed right ABI 0.82 and a left ABI of 0.84. He had palpable pedal pulses on exam.  He had his right toenail removed by Dr. Paulla Johnston 05/07/2018 after he had clipped it himself prior to that and and developed an infection.  Since that time he has had development of gangrene and is required multiple rounds of antibiotics.  He is also had worsening lower extremity edema.  Today in the office his right toe is gangrenous but he does have palpable pedal pulses.   Current Meds  Medication Sig  . apixaban (ELIQUIS) 2.5 MG TABS tablet Take 1 tablet (2.5 mg total) by mouth 2 (two) times daily.  [name] atorvastatin (LIPITOR) 40 MG tablet TAKE 1 TABLET BY MOUTH ONCE DAILY  . calcium carbonate (TUMS - DOSED IN MG ELEMENTAL CALCIUM) 500 MG chewable tablet Chew 1 tablet by mouth as needed for indigestion or heartburn.  . cephALEXin (KEFLEX) 500 MG capsule Take 1 capsule by mouth 2 (two) times daily.  . dorzolamide-timolol (COSOPT) 22.3-6.8 MG/ML ophthalmic solution Place 1 drop into both eyes 2 (two) times daily.  [name] doxazosin (CARDURA) 2 MG tablet Take 1 tablet (2 mg total) by mouth at bedtime.  . finasteride (PROSCAR) 5 MG tablet Take 5 mg by mouth daily.  . furosemide (LASIX) 40 MG tablet TAKE 1 TABLET(40 MG) BY MOUTH DAILY  . glipiZIDE (GLUCOTROL) 5 MG tablet Take 5 mg by mouth 2 (two) times daily.   [name] glucosamine-chondroitin 500-400 MG tablet Take 3 tablets by mouth 2 (two)  times daily.   [name] levothyroxine (SYNTHROID, LEVOTHROID) 50 MCG tablet Take 1 tablet by mouth daily.  . Methylcellulose, Laxative, (FIBER THERAPY PO) Take by mouth daily.  . metoprolol tartrate (LOPRESSOR) 25 MG tablet Take 12.5 mg as needed for palpitations/heart racing  . Omega-3 Fatty Acids (FISH OIL PO) Take 1 capsule by mouth daily.  [name] oxyCODONE-acetaminophen (PERCOCET) 5-325 MG tablet Take 1-2 tablets by mouth every 8 (eight) hours as needed for severe pain.  [name] pyridOXINE (VITAMIN B-6) 100 MG tablet Take 100 mg by mouth daily.  . ranitidine (ZANTAC) 150 MG tablet Take 150 mg by mouth at bedtime.  . tamsulosin (FLOMAX) 0.4 MG CAPS Take 0.4 mg by mouth 2 (two) times daily.   . vitamin C (ASCORBIC ACID) 500 MG tablet Take 500 mg by mouth daily.  . vitamin E 400 UNIT capsule Take 400 Units by mouth daily.     Allergies  Allergen Reactions  . Iodinated Diagnostic Agents     Other reaction(s): Fever  . Iodine-131 Other (See Comments)    Breakout, fever  . Iohexol      Code: RASH, Desc: PT STATES HOT ALL OVER HAD TO HAVE ICE PACKS ALL OVER HIM AND DIFF BREATHING, PT REFUSED IV DYE     Social History   Socioeconomic History  . Marital status: Widowed    Spouse name: Not on file  . Number of children: Not on file  .  Years of education: Not on file  . Highest education level: Not on file  Occupational History  . Not on file  Social Needs  . Financial resource strain: Not on file  . Food insecurity    Worry: Not on file    Inability: Not on file  . Transportation needs    Medical: Not on file    Non-medical: Not on file  Tobacco Use  . Smoking status: Former Smoker    Quit date: 01/14/1935    Years since quitting: 83.5  . Smokeless tobacco: Former Systems developer    Types: Chew  Substance and Sexual Activity  . Alcohol use: No  . Drug use: No  . Sexual activity: Not on file  Lifestyle  . Physical activity    Days per week: Not on file    Minutes per session: Not on file  .  Stress: Not on file  Relationships  . Social Herbalist on phone: Not on file    Gets together: Not on file    Attends religious service: Not on file    Active member of club or organization: Not on file    Attends meetings of clubs or organizations: Not on file    Relationship status: Not on file  . Intimate partner violence    Fear of current or ex partner: Not on file    Emotionally abused: Not on file    Physically abused: Not on file    Forced sexual activity: Not on file  Other Topics Concern  . Not on file  Social History Narrative  . Not on file     Review of Systems: General: negative for chills, fever, night sweats or weight changes.  Cardiovascular: negative for chest pain, dyspnea on exertion, edema, orthopnea, palpitations, paroxysmal nocturnal dyspnea or shortness of breath Dermatological: negative for rash Respiratory: negative for cough or wheezing Urologic: negative for hematuria Abdominal: negative for nausea, vomiting, diarrhea, bright red blood per rectum, melena, or hematemesis Neurologic: negative for visual changes, syncope, or dizziness All other systems reviewed and are otherwise negative except as noted above.    Blood pressure (!) 106/54, pulse (!) 53, temperature (!) 96.1 F (35.6 C), height 5\' 10"  (1.778 m), weight 218 lb (98.9 kg), SpO2 97 %.  General appearance: alert and no distress Neck: no adenopathy, no carotid bruit, no JVD, supple, symmetrical, trachea midline and thyroid not enlarged, symmetric, no tenderness/mass/nodules Lungs: clear to auscultation bilaterally Heart: High-pitched 2/6 outflow tract murmur consistent with aortic stenosis Extremities: 1-2+ pitting edema Pulses: 2+ and symmetric Skin: Gangrene right great toe Neurologic: Alert and oriented X 3, normal strength and tone. Normal symmetric reflexes. Normal coordination and gait  EKG not performed today  ASSESSMENT AND PLAN:   PAD (peripheral artery disease)  ([place]) Mr. [name] was added on today for critical limb ischemia.  He is got a gangrenous right great toe status post toenail removal by Dr. Paulla Johnston back in May.  Is had multiple treatments with antibiotics since that time.  He has had left SFA intervention by myself back in 2012 with Dopplers performed 08/20/2016 revealing a right ABI 0.82 and a left 2.84.  I am going to repeat lower extremity arterial Doppler studies to assess adequacy of perfusion for healing.      Travis Harp MD FACP,FACC,FAHA, [facility] 07/15/2018 3:12 PM

## 2018-07-20 ENCOUNTER — Ambulatory Visit (HOSPITAL_COMMUNITY): Payer: Medicare Other | Attending: Family Medicine

## 2018-07-20 ENCOUNTER — Encounter (HOSPITAL_COMMUNITY): Payer: Self-pay

## 2018-07-20 ENCOUNTER — Other Ambulatory Visit: Payer: Self-pay

## 2018-07-20 DIAGNOSIS — R600 Localized edema: Secondary | ICD-10-CM | POA: Diagnosis not present

## 2018-07-20 DIAGNOSIS — S91201A Unspecified open wound of right great toe with damage to nail, initial encounter: Secondary | ICD-10-CM | POA: Insufficient documentation

## 2018-07-20 DIAGNOSIS — S91301A Unspecified open wound, right foot, initial encounter: Secondary | ICD-10-CM | POA: Diagnosis not present

## 2018-07-20 NOTE — Telephone Encounter (Signed)
-----   Message from Abigail Butts, PA-C sent at 07/20/2018 10:58 AM EDT -----  Thanks, Eliezer Lofts. Recommend he monitor his weight daily and for him to take an extra lasix (40mg ) for weight gain of 2lbs in 1 day or 5lbs in 1 week. We will plan to check his kidney function at his next follow-up visit. Is it possible to get him an in-office appointment for later this week/early next week?    Spoke with daughter [name] and communicated PA recommendations. She voiced understanding. Scheduled patient for sooner visit on 07/23/2018 @ 1130 with Arnold Long DNP

## 2018-07-20 NOTE — Therapy (Signed)
[place], [place], [address] Phone: [phone]   Fax:  [phone]  Wound Care Therapy  Patient Details  Name: [name] MRN: [id number] Date of Birth: 01-01-1928 Referring Provider (PT): [name], MD   Encounter Date: 07/20/2018  PT End of Session - 07/20/18 1230    Visit Number  13    Number of Visits  15    Date for PT Re-Evaluation  07/24/18    Authorization Type  [employer] Medicare    Authorization Time Period  06/12/18-07/24/18    Authorization - Visit Number  5    Authorization - Number of Visits  10    PT Start Time  1120    PT Stop Time  1205    PT Time Calculation (min)  45 min    Activity Tolerance  Patient tolerated treatment well    Behavior During Therapy  [facility] for tasks assessed/performed       Past Medical History:  Diagnosis Date  . Arthritis   . Borderline diabetic   . Glaucoma   . Hyperlipidemia   . Hypertension   . PVD (peripheral vascular disease) ([place])   . Sleep apnea    Cpap ordered but doesnt use    Past Surgical History:  Procedure Laterality Date  . APPENDECTOMY  1943  . CATARACT EXTRACTION  2012   x2  . COLONOSCOPY N/A 11/01/2014   Procedure: COLONOSCOPY;  Surgeon: Aviva Signs, MD;  Location: [facility];  Service: Gastroenterology;  Laterality: N/A;  . ESOPHAGOGASTRODUODENOSCOPY N/A 11/01/2014   Procedure: ESOPHAGOGASTRODUODENOSCOPY (EGD);  Surgeon: Aviva Signs, MD;  Location: [facility];  Service: Gastroenterology;  Laterality: N/A;  . HERNIA REPAIR Left   . INGUINAL HERNIA REPAIR Right 03/01/2013   Procedure: RIGHT INGUINAL HERNIORRHAPHY;  Surgeon: Jamesetta So, MD;  Location: [facility] OR[facility];  Service: General;  Laterality: Right;  . INSERTION OF MESH Right 03/01/2013   Procedure: INSERTION OF MESH;  Surgeon: Jamesetta So, MD;  Location: [facility];  Service: General;  Laterality: Right;  . NM Hand  06/05/2010   no significant ischemia  . stent   06/14/2010   left leg  . US ECHOCARDIOGRAPHY  01/21/2006   moderate mitral annular ca+, mild MR, AOV moderately sclerotic.    There were no vitals filed for this visit.   Subjective Assessment - 07/20/18 1217    Subjective  Pt stated daugther reports loose skin around toe, continues to dress it daily.  Reports his Rt groin continues to give him some pain.    Pertinent History  Per chart review patient was seen on 05/07/18 at [facility] in [place]. He was seen by Dr. Ila Mcgill who noted the nail to be damaged and loose with drainage coming from medial and lateral side of wound. There was redness noted in inner phalangeal joint and pt reported pain/tenderness around entire toe. Dr. Paulla Dolly removed the nail and tissue from nail bed and margins. He prescribed antibiotics for local infection and had instructed the patient on foot soaks. Pt was then seen in the ED on 05/29/18 for increased swelling and redness of Rt great toe.     Currently in Pain?  Yes    Pain Score  7     Pain Location  Leg   Rt toe and groin   Pain Orientation  Right;Proximal;Distal    Pain Descriptors / Indicators  Aching    Pain  Type  Acute pain                Wound Therapy - 07/20/18 1217    Subjective  Pt stated daugther reports loose skin around toe, continues to dress it daily.  Reports his Rt groin continues to give him some pain.    Patient and Family Stated Goals  Wound to heal    Date of Onset  05/07/18   Nail removed by Dr. Paulla Dolly   Pain Scale  0-10    Patients Stated Pain Goal  0    Pain Intervention(s)  RN made aware    Multiple Pain Sites  No    Evaluation and Treatment Procedures Explained to Patient/Family  Yes    Evaluation and Treatment Procedures  agreed to    Wound Properties Date First Assessed: 05/30/18 Time First Assessed: 2200 Wound Type: Other (Comment) Location: Toe (Comment  which one) , right great toe  Location Orientation: Right Wound Description (Comments):  missing toenail Present on Admission: Yes   Dressing Type  Gauze (Comment);Impregnated gauze (bismuth)    Dressing Changed  Changed    Dressing Status  Old drainage    Dressing Change Frequency  PRN    Site / Wound Assessment  Brown;Black;Painful;Red;Yellow    % Wound base Red or Granulating  30%    % Wound base Yellow/Fibrinous Exudate  50%    % Wound base Black/Eschar  20%    Peri-wound Assessment  Edema;Erythema (non-blanchable);Maceration    Margins  Unattached edges (unapproximated)    Drainage Amount  Minimal    Drainage Description  No odor;Serosanguineous    Treatment  Cleansed;Debridement (Selective)    Wound Properties Date First Assessed: 07/20/18 Time First Assessed: 1135 Wound Type: Other (Comment) Location: Foot Location Orientation: Upper   Dressing Type  Impregnated gauze (bismuth);Gauze (Comment)    Dressing Changed  New    Dressing Status  Clean;Dry;Intact    Site / Wound Assessment  Granulation tissue;Pink;Red    % Wound base Red or Granulating  95%    % Wound base Yellow/Fibrinous Exudate  5%    % Wound base Black/Eschar  0%    Peri-wound Assessment  Intact    Wound Length (cm)  0.4 cm    Wound Width (cm)  0.8 cm    Wound Depth (cm)  0 cm    Wound Volume (cm^3)  0 cm^3    Wound Surface Area (cm^2)  0.32 cm^2    Drainage Amount  None    Treatment  Cleansed    Selective Debridement - Location  dry devitalized tissue and sloth    Selective Debridement - Tools Used  Forceps;Scissors    Selective Debridement - Tissue Removed  slough and adheared necrotic/devitalized tissue form wound bed    Wound Therapy - Clinical Statement  Noted wound on dorsal aspect of foot, cleansed and measured no selective debridement complete.  Noted increased maceration around wound, removal of dead skin perimeter and continued debridement for removal of slough in wound bed.  Edema and redness present, pt reports ABI testing later this week.  Continued wiht silver hydrofiber on wound bed wiht  gauze dressings.  Did apply xeroform on new wound to assist with healing.  Pt requested PTA to call daughter after wards, called and discussed wound presentation.    Wound Therapy - Functional Problem List  walking, showering/bathing, infection risk    Factors Delaying/Impairing Wound Healing  Diabetes Mellitus;Multiple medical problems;Tobacco use;Vascular compromise   PAD, PVD,  HTN, DM   Hydrotherapy Plan  Debridement;Dressing change;Patient/family education;Pulsatile lavage with suction;Other (comment)   low level laser therapy   Wound Therapy - Frequency  3X / week   3x/week for 3 weeks then 2x.week   Wound Therapy - Current Recommendations  PT    Wound Plan  Measure on Fridays (1x/week). Continue to debride necrotic tissue until well granualted with no signs of infection. Dress appropriately to manage adhered slough and drainage. Monitor dark spot on inner phalangeal area.    Dressing   silver hydrofiber, gauze, tape, netting                PT Short Term Goals - 07/03/18 1412      PT SHORT TERM GOAL #1   Title  Wound will be 100% red or granulated tissue with no slough, eshcar, or necrotic tissue present to indicate wound is moving into proliferative phase of healing.     Baseline  pt conitnues to have adherant slough and eschar to wound    Time  3    Period  Weeks    Status  On-going    Target Date  07/03/18      PT SHORT TERM GOAL #2   Title  Patient will no longer have edema or errythema present around periwound indicating no persisting signs of local infection.    Baseline  errythema and edam persists    Time  3    Period  Weeks    Status  On-going        PT Long Term Goals - 07/03/18 1413      PT LONG TERM GOAL #1   Title  Wound size with be 25% reduced for surface area indicating good proliferation of wound healing.     Time  6    Period  Weeks    Status  On-going    Target Date  07/24/18      PT LONG TERM GOAL #2   Title  Patient and daughter will  demonstrate proper self care technique to complete wound care management at home when appropriate and wound healing progressed enough.    Time  6    Period  Weeks    Status  On-going              Patient will benefit from skilled therapeutic intervention in order to improve the following deficits and impairments:     Visit Diagnosis: 1. Open wound of right great toe with damage to nail, initial encounter   2. Localized edema        Problem List Patient Active Problem List   Diagnosis Date Noted  . Type 2 diabetes mellitus with foot ulcer, without long-term current use of insulin ([place])   . Gastroesophageal reflux disease   . Cellulitis of great toe of right foot 05/29/2018  . Atrial fibrillation, chronic   . Chest pain 07/25/2017  . PAF (paroxysmal atrial fibrillation) ([place]) 07/25/2017  . BPH (benign prostatic hyperplasia) 07/25/2017  . Shortness of breath 05/11/2017  . Hypothyroidism 05/11/2017  . Chronic diastolic CHF (congestive heart failure) ([place]) 05/11/2017  . Aortic valve stenosis 06/12/2016  . RBBB 06/12/2016  . PAD (peripheral artery disease) ([place]) 08/04/2012  . Essential hypertension 08/04/2012  . Hyperlipidemia 08/04/2012  . Dizziness 08/04/2012   Ihor Austin, [place]; [place]  Aldona Lento 07/20/2018, 12:31 PM  [address] [facility] [address] [place], [place], [address] Phone: [phone]   Fax:  [phone]  Name: [name] MRN: [id number] Date of Birth: [birth year]

## 2018-07-20 NOTE — Telephone Encounter (Signed)
-----   Message from Abigail Butts, PA-C sent at 07/17/2018 7:26 AM EDT -----  [name] - can you follow-up with this patient of Dr. Lysbeth Penner. I recommended a 1 week course of lasix 40mg  BID, then resume once daily dosing. If his swelling is still severe, could you help with arranging a follow-up visit in office with [facility] to reassess? Thank you!   Called daughter [name]. She reports Thursday & Friday swelling in LE improved. Swelling started a little Saturday/Sunday but better than previous. They are monitoring diet - watching salt. Patient is monitoring hydration (about 64oz/daily). Breathing is OK. He is currently taking once daily lasix. He has vascular test @ [facility] tomorrow and f/up on 7/22  Sent to PA to advise if any changes are needed, if appointment should be sooner or just monitor symptoms?

## 2018-07-21 ENCOUNTER — Ambulatory Visit (HOSPITAL_COMMUNITY)
Admission: RE | Admit: 2018-07-21 | Discharge: 2018-07-21 | Disposition: A | Discharge status: Home / Self Care | Payer: Medicare Other | Source: Ambulatory Visit | Attending: Cardiology | Admitting: Cardiology

## 2018-07-21 ENCOUNTER — Encounter (HOSPITAL_COMMUNITY): Payer: Medicare Other

## 2018-07-21 DIAGNOSIS — N183 Chronic kidney disease, stage 3 (moderate): Secondary | ICD-10-CM | POA: Diagnosis not present

## 2018-07-21 DIAGNOSIS — I739 Peripheral vascular disease, unspecified: Secondary | ICD-10-CM | POA: Diagnosis not present

## 2018-07-21 DIAGNOSIS — L089 Local infection of the skin and subcutaneous tissue, unspecified: Secondary | ICD-10-CM | POA: Diagnosis not present

## 2018-07-21 NOTE — Progress Notes (Signed)
Cardiology Office Note   Date:  07/21/2018   ID:  [name], DOB 1927/04/03, MRN [id number]  PCP:  [name], MD  Cardiologist:  Dr. Gwenlyn Found CC: Abnormal ABI/ PAD   History of Present Illness: [name] is a 85 y.o. male who presents for ongoing assessment and management of hypertension, hyperlipidemia, and peripheral vascular disease.  The patient has stent to the left SFA completed in May 2012 by Dr. Gwenlyn Found.  The patient had follow-up ABIs completed in August 2018 which revealed a right ABI of 0.82 and a left ABI of 0.84.  He did have palpable pulses on last evaluation.    Unfortunately the patient had his right toenail removed in April 2020 after he clipped it himself and developed an infection which led to gangrene.  He has been on multiple rounds of antibiotics.  When last seen in the office on 07/15/2018 the patient had repeat lower extremity arterial Doppler studies to assess adequacy of perfusion for healing of his gangrenous toe.  Bilateral ABI-07/21/2018 Right: Resting right ankle-brachial index indicates severe right lower extremity arterial disease. The right toe-brachial index is abnormal.  Left: Resting left ankle-brachial index indicates moderate left lower extremity arterial disease. The left toe-brachial index is abnormal.  LE Doppler Study 07/21/2018 Summary: Right: Total occlusion noted in the above-the-knee popliteal artery.  Left: 75-99% stenosis noted in the above-the-knee and behind-the-knee popliteal artery.  ABI Findings: +---------+------------------+-----+-------------------+---------------+  Right     Rt Pressure (mmHg) Index Waveform            Comment          +---------+------------------+-----+-------------------+---------------+  Brachial  153                                                           +---------+------------------+-----+-------------------+---------------+  ATA       67                 0.43  dampened monophasic                   +---------+------------------+-----+-------------------+---------------+  PTA       0                  0.00  absent              known occlusion  +---------+------------------+-----+-------------------+---------------+  PERO      65                 0.42  dampened monophasic                  +---------+------------------+-----+-------------------+---------------+  Great Toe 0                  0.00  absent                               +---------+------------------+-----+-------------------+---------------+  +---------+------------------+-----+----------+---------------+  Left      Lt Pressure (mmHg) Index Waveform   Comment          +---------+------------------+-----+----------+---------------+  Brachial  155                                                  +---------+------------------+-----+----------+---------------+  ATA       78                 0.50  monophasic                  +---------+------------------+-----+----------+---------------+  PTA       0                  0.00  absent     known occlusion  +---------+------------------+-----+----------+---------------+  PERO      70                 0.45  monophasic                  +---------+------------------+-----+----------+---------------+  Great Toe 0                  0.00  absent                      +---------+------------------+-----+----------+---------------+  +-------+-----------+-----------+------------+------------+  ABI/TBI Today's ABI Today's TBI Previous ABI Previous TBI  +-------+-----------+-----------+------------+------------+  Right   .43         0           .82          .38           +-------+-----------+-----------+------------+------------+  Left    .50         0           .84          .31           +-------+-----------+-----------+------------+------------+  [name] comes today sitting in a wheelchair accompanied by his daughter.  He continues to have lower extremity pain worse on the right.  He is only  been taking Tylenol for the pain.  He has been prescribed oxycodone but has been afraid to take it.  The patient states that he is continued to go to wound clinic for dressing of his right toe.   Past Medical History:  Diagnosis Date   Arthritis    Borderline diabetic    Glaucoma    Hyperlipidemia    Hypertension    PVD (peripheral vascular disease) ([place])    Sleep apnea    Cpap ordered but doesnt use    Past Surgical History:  Procedure Laterality Date   APPENDECTOMY  1943   CATARACT EXTRACTION  2012   x2   COLONOSCOPY N/A 11/01/2014   Procedure: COLONOSCOPY;  Surgeon: Aviva Signs, MD;  [facility];  Service: Gastroenterology;  Laterality: N/A;   ESOPHAGOGASTRODUODENOSCOPY N/A 11/01/2014   Procedure: ESOPHAGOGASTRODUODENOSCOPY (EGD);  Surgeon: Aviva Signs, MD;  [facility];  Service: Gastroenterology;  Laterality: N/A;   HERNIA REPAIR Left    INGUINAL HERNIA REPAIR Right 03/01/2013   Procedure: RIGHT INGUINAL HERNIORRHAPHY;  Surgeon: Jamesetta So, MD;  Location: [facility];  Service: General;  Laterality: Right;   INSERTION OF MESH Right 03/01/2013   Procedure: INSERTION OF MESH;  Surgeon: Jamesetta So, MD;  Location: [facility];  Service: General;  Laterality: Right;   NM MYOCAR PERF WALL MOTION  06/05/2010   no significant ischemia   stent  06/14/2010   left leg   US ECHOCARDIOGRAPHY  01/21/2006   moderate mitral annular ca+, mild MR, AOV moderately sclerotic.     Current Outpatient Medications  Medication Sig Dispense Refill   apixaban (ELIQUIS) 2.5 MG TABS tablet Take 1 tablet (2.5  mg total) by mouth 2 (two) times daily. 180 tablet 3   atorvastatin (LIPITOR) 40 MG tablet TAKE 1 TABLET BY MOUTH ONCE DAILY 90 tablet 3   calcium carbonate (TUMS - DOSED IN MG ELEMENTAL CALCIUM) 500 MG chewable tablet Chew 1 tablet by mouth as needed for indigestion or heartburn.     cephALEXin (KEFLEX) 500 MG capsule Take 1 capsule by mouth 2 (two) times  daily.     dorzolamide-timolol (COSOPT) 22.3-6.8 MG/ML ophthalmic solution Place 1 drop into both eyes 2 (two) times daily.     doxazosin (CARDURA) 2 MG tablet Take 1 tablet (2 mg total) by mouth at bedtime. 90 tablet 3   finasteride (PROSCAR) 5 MG tablet Take 5 mg by mouth daily.     furosemide (LASIX) 40 MG tablet TAKE 1 TABLET(40 MG) BY MOUTH DAILY 90 tablet 3   glipiZIDE (GLUCOTROL) 5 MG tablet Take 5 mg by mouth 2 (two) times daily.      glucosamine-chondroitin 500-400 MG tablet Take 3 tablets by mouth 2 (two) times daily.      levothyroxine (SYNTHROID, LEVOTHROID) 50 MCG tablet Take 1 tablet by mouth daily.  0   Methylcellulose, Laxative, (FIBER THERAPY PO) Take by mouth daily.     metoprolol tartrate (LOPRESSOR) 25 MG tablet Take 12.5 mg as needed for palpitations/heart racing 30 tablet 1   Omega-3 Fatty Acids (FISH OIL PO) Take 1 capsule by mouth daily.     oxyCODONE-acetaminophen (PERCOCET) 5-325 MG tablet Take 1-2 tablets by mouth every 8 (eight) hours as needed for severe pain. 20 tablet 0   pyridOXINE (VITAMIN B-6) 100 MG tablet Take 100 mg by mouth daily.     ranitidine (ZANTAC) 150 MG tablet Take 150 mg by mouth at bedtime.     tamsulosin (FLOMAX) 0.4 MG CAPS Take 0.4 mg by mouth 2 (two) times daily.      vitamin C (ASCORBIC ACID) 500 MG tablet Take 500 mg by mouth daily.     vitamin E 400 UNIT capsule Take 400 Units by mouth daily.     No current facility-administered medications for this visit.     Allergies:   Iodinated diagnostic agents, Iodine-131, and Iohexol    Social History:  The patient  reports that he quit smoking about 83 years ago. He has quit using smokeless tobacco.  His smokeless tobacco use included chew. He reports that he does not drink alcohol or use drugs.   Family History:  The patient's family history includes Cancer in his mother; Heart attack in his brother, brother, and father; Stroke in his father.    ROS: All other systems are  reviewed and negative. Unless otherwise mentioned in H&P    PHYSICAL EXAM: VS:  There were no vitals taken for this visit. , BMI There is no height or weight on file to calculate BMI. GEN: Well nourished, well developed, in no acute distress HEENT: normal Neck: no JVD, radiation murmur to carotid or masses Cardiac: RRR; 3/6 holosystolic murmurs, rubs, or gallops,no edema  Respiratory:  Clear to auscultation bilaterally, normal work of breathing GI: soft, nontender, nondistended, + BS MS: no deformity or atrophy.  Unable to feel pulses in bilateral lower extremities.  The skin is warm and pink currently.  Dressing to the right great toe. Skin: warm and dry, no rash Neuro:  Strength and sensation are intact Psych: euthymic mood, full affect   EKG: Normal sinus rhythm, sinus bradycardia, heart rate of 53 bpm with right bundle branch  block.  Recent Labs: 07/25/2017: Magnesium 2.0 05/29/2018: TSH 3.572 06/01/2018: Hemoglobin 10.9; Platelets 209 07/08/2018: BUN 16; Creatinine, Ser 1.27; Potassium 4.0; Sodium 133    Lipid Panel    Component Value Date/Time   CHOL 99 08/06/2012 1116   TRIG 89 08/06/2012 1116   HDL 39 (L) 08/06/2012 1116   LDLCALC 42 08/06/2012 1116      Wt Readings from Last 3 Encounters:  07/15/18 218 lb (98.9 kg)  07/08/18 224 lb 6.4 oz (101.8 kg)  06/01/18 222 lb 14.2 oz (101.1 kg)      Other studies Reviewed: ABIs and Doppler study as above. Echocardiogram 11/24/2017  Left ventricle: The cavity size was normal. There was moderate   concentric hypertrophy. Systolic function was normal. The   estimated ejection fraction was in the range of 60% to 65%. Wall   motion was normal; there were no regional wall motion   abnormalities. Features are consistent with a pseudonormal left   ventricular filling pattern, with concomitant abnormal relaxation   and increased filling pressure (grade 2 diastolic dysfunction).   Doppler parameters are consistent with high  ventricular filling   pressure. - Aortic valve: Trileaflet; mildly thickened, moderately calcified   leaflets. There was moderate stenosis. Valve area (VTI): 1.47   cm^2. Valve area (Vmax): 1.4 cm^2. Valve area (Vmean): 1.38 cm^2. - Mitral valve: Mildly calcified annulus. Mildly thickened leaflets   . There was mild regurgitation. - Left atrium: The atrium was mildly to moderately dilated. - Tricuspid valve: There was mild regurgitation. - Pulmonary arteries: PA peak pressure: 31 mm Hg (S).  ASSESSMENT AND PLAN:  1.  Severe peripheral vascular disease: The patient has had bilateral ABIs with Doppler studies completed at the request of Dr.Berry, on 07/21/2018.  This revealed severe right lower extremity arterial disease with ankle-brachial indices of 0.42, with 0.0 (absent) to the great toe.  The left ABI, index 0.45.  Preliminary report revealing right total occlusion noted above the knee in the popliteal artery, with 75% and 99% stenosis noted in above-the-knee and behind the knee popliteal artery on the left.  I have spoken with Dr.Berry this morning prior to seeing the patient to discuss results.  The patient is to be planned for arteriogram of the lower extremities on Monday, July 13, as first case with Dr. Gwenlyn Found.  This is been ordered and planned for the patient.  I have discussed the procedure with the patient and his daughter in the clinic today.  I have given them a copy of the report as well.  I have answered multiple questions.  The patient is willing to proceed, risks and benefits have been discussed with the patient and he verbalizes understanding.  Preprocedure labs will be completed to include COVID screening.  He is aware that he will need to quarantine from this point on until the procedure.  He is also advised that if he has severe pain, coldness, or discoloration (turning blue or white) in either lower extremity he is to come to the hospital immediately.  Otherwise he will continue  current medication regimen.  He is advised to take oxycodone as needed for pain as he is not finding relief with Tylenol, reassuring him that the oxycodone will be more helpful to him for pain control until being seen on Monday.  Both he and his daughter verbalized understanding.  2.  Aortic valve stenosis: Significant aortic murmur is auscultated during evaluation.  Most recent echocardiogram on 11/24/2017 revealed a trileaflet aortic valve with  moderately calcified leaflets and moderate stenosis.  Valve area by VTI 1.47 cm, valve area (V-max) 1.4 cm.  Valve area (V mean) 1.38 cm.  Follow-up echocardiogram should be completed after addressing peripheral arterial disease once he becomes more active to evaluate need for TAVR or continued medical management.  3.  Hypertension: Blood pressures currently well controlled.  No changes in his medication regimen at this time.  4.  Paroxysmal atrial fibrillation: He remains on apixaban 2.5 mg daily.  He will hold his apixaban for 48 hours prior to his procedure on Monday, July 27, 2018.  Current medicines are reviewed at length with the patient today.    Labs/ tests ordered today include: Pre-Procedure labs. LEE arteriogram with possible intervention.    Phill Myron. West Pugh, ANP, AACC   07/21/2018 1:39 PM    [address] Office [phone] Fax [phone]

## 2018-07-21 NOTE — H&P (View-Only) (Signed)
Cardiology Office Note   Date:  07/21/2018   ID:  MANG [name], DOB 1927-11-11, MRN [id number]  PCP:  [name], MD  Cardiologist:  Dr. Gwenlyn Found CC: Abnormal ABI/ PAD   History of Present Illness: [name] is a 85 y.o. male who presents for ongoing assessment and management of hypertension, hyperlipidemia, and peripheral vascular disease.  The patient has stent to the left SFA completed in May 2012 by Dr. Gwenlyn Found.  The patient had follow-up ABIs completed in August 2018 which revealed a right ABI of 0.82 and a left ABI of 0.84.  He did have palpable pulses on last evaluation.    Unfortunately the patient had his right toenail removed in April 2020 after he clipped it himself and developed an infection which led to gangrene.  He has been on multiple rounds of antibiotics.  When last seen in the office on 07/15/2018 the patient had repeat lower extremity arterial Doppler studies to assess adequacy of perfusion for healing of his gangrenous toe.  Bilateral ABI-07/21/2018 Right: Resting right ankle-brachial index indicates severe right lower extremity arterial disease. The right toe-brachial index is abnormal.  Left: Resting left ankle-brachial index indicates moderate left lower extremity arterial disease. The left toe-brachial index is abnormal.  LE Doppler Study 07/21/2018 Summary: Right: Total occlusion noted in the above-the-knee popliteal artery.  Left: 75-99% stenosis noted in the above-the-knee and behind-the-knee popliteal artery.  ABI Findings: +---------+------------------+-----+-------------------+---------------+  Right     Rt Pressure (mmHg) Index Waveform            Comment          +---------+------------------+-----+-------------------+---------------+  Brachial  153                                                           +---------+------------------+-----+-------------------+---------------+  ATA       67                 0.43  dampened monophasic                   +---------+------------------+-----+-------------------+---------------+  PTA       0                  0.00  absent              known occlusion  +---------+------------------+-----+-------------------+---------------+  PERO      65                 0.42  dampened monophasic                  +---------+------------------+-----+-------------------+---------------+  Great Toe 0                  0.00  absent                               +---------+------------------+-----+-------------------+---------------+  +---------+------------------+-----+----------+---------------+  Left      Lt Pressure (mmHg) Index Waveform   Comment          +---------+------------------+-----+----------+---------------+  Brachial  155                                                  +---------+------------------+-----+----------+---------------+  ATA       78                 0.50  monophasic                  +---------+------------------+-----+----------+---------------+  PTA       0                  0.00  absent     known occlusion  +---------+------------------+-----+----------+---------------+  PERO      70                 0.45  monophasic                  +---------+------------------+-----+----------+---------------+  Great Toe 0                  0.00  absent                      +---------+------------------+-----+----------+---------------+  +-------+-----------+-----------+------------+------------+  ABI/TBI Today's ABI Today's TBI Previous ABI Previous TBI  +-------+-----------+-----------+------------+------------+  Right   .43         0           .82          .38           +-------+-----------+-----------+------------+------------+  Left    .50         0           .84          .31           +-------+-----------+-----------+------------+------------+  [name] comes today sitting in a wheelchair accompanied by his daughter.  He continues to have lower extremity pain worse on the right.  He is only  been taking Tylenol for the pain.  He has been prescribed oxycodone but has been afraid to take it.  The patient states that he is continued to go to wound clinic for dressing of his right toe.   Past Medical History:  Diagnosis Date   Arthritis    Borderline diabetic    Glaucoma    Hyperlipidemia    Hypertension    PVD (peripheral vascular disease) ([place])    Sleep apnea    Cpap ordered but doesnt use    Past Surgical History:  Procedure Laterality Date   APPENDECTOMY  1943   CATARACT EXTRACTION  2012   x2   COLONOSCOPY N/A 11/01/2014   Procedure: COLONOSCOPY;  Surgeon: Aviva Signs, MD;  [facility];  Service: Gastroenterology;  Laterality: N/A;   ESOPHAGOGASTRODUODENOSCOPY N/A 11/01/2014   Procedure: ESOPHAGOGASTRODUODENOSCOPY (EGD);  Surgeon: Aviva Signs, MD;  [facility];  Service: Gastroenterology;  Laterality: N/A;   HERNIA REPAIR Left    INGUINAL HERNIA REPAIR Right 03/01/2013   Procedure: RIGHT INGUINAL HERNIORRHAPHY;  Surgeon: Jamesetta So, MD;  Location: [facility];  Service: General;  Laterality: Right;   INSERTION OF MESH Right 03/01/2013   Procedure: INSERTION OF MESH;  Surgeon: Jamesetta So, MD;  Location: [facility];  Service: General;  Laterality: Right;   NM MYOCAR PERF WALL MOTION  06/05/2010   no significant ischemia   stent  06/14/2010   left leg   US ECHOCARDIOGRAPHY  01/21/2006   moderate mitral annular ca+, mild MR, AOV moderately sclerotic.     Current Outpatient Medications  Medication Sig Dispense Refill   apixaban (ELIQUIS) 2.5 MG TABS tablet Take 1 tablet (2.5  mg total) by mouth 2 (two) times daily. 180 tablet 3   atorvastatin (LIPITOR) 40 MG tablet TAKE 1 TABLET BY MOUTH ONCE DAILY 90 tablet 3   calcium carbonate (TUMS - DOSED IN MG ELEMENTAL CALCIUM) 500 MG chewable tablet Chew 1 tablet by mouth as needed for indigestion or heartburn.     cephALEXin (KEFLEX) 500 MG capsule Take 1 capsule by mouth 2 (two) times  daily.     dorzolamide-timolol (COSOPT) 22.3-6.8 MG/ML ophthalmic solution Place 1 drop into both eyes 2 (two) times daily.     doxazosin (CARDURA) 2 MG tablet Take 1 tablet (2 mg total) by mouth at bedtime. 90 tablet 3   finasteride (PROSCAR) 5 MG tablet Take 5 mg by mouth daily.     furosemide (LASIX) 40 MG tablet TAKE 1 TABLET(40 MG) BY MOUTH DAILY 90 tablet 3   glipiZIDE (GLUCOTROL) 5 MG tablet Take 5 mg by mouth 2 (two) times daily.      glucosamine-chondroitin 500-400 MG tablet Take 3 tablets by mouth 2 (two) times daily.      levothyroxine (SYNTHROID, LEVOTHROID) 50 MCG tablet Take 1 tablet by mouth daily.  0   Methylcellulose, Laxative, (FIBER THERAPY PO) Take by mouth daily.     metoprolol tartrate (LOPRESSOR) 25 MG tablet Take 12.5 mg as needed for palpitations/heart racing 30 tablet 1   Omega-3 Fatty Acids (FISH OIL PO) Take 1 capsule by mouth daily.     oxyCODONE-acetaminophen (PERCOCET) 5-325 MG tablet Take 1-2 tablets by mouth every 8 (eight) hours as needed for severe pain. 20 tablet 0   pyridOXINE (VITAMIN B-6) 100 MG tablet Take 100 mg by mouth daily.     ranitidine (ZANTAC) 150 MG tablet Take 150 mg by mouth at bedtime.     tamsulosin (FLOMAX) 0.4 MG CAPS Take 0.4 mg by mouth 2 (two) times daily.      vitamin C (ASCORBIC ACID) 500 MG tablet Take 500 mg by mouth daily.     vitamin E 400 UNIT capsule Take 400 Units by mouth daily.     No current facility-administered medications for this visit.     Allergies:   Iodinated diagnostic agents, Iodine-131, and Iohexol    Social History:  The patient  reports that he quit smoking about 83 years ago. He has quit using smokeless tobacco.  His smokeless tobacco use included chew. He reports that he does not drink alcohol or use drugs.   Family History:  The patient's family history includes Cancer in his mother; Heart attack in his brother, brother, and father; Stroke in his father.    ROS: All other systems are  reviewed and negative. Unless otherwise mentioned in H&P    PHYSICAL EXAM: VS:  There were no vitals taken for this visit. , BMI There is no height or weight on file to calculate BMI. GEN: Well nourished, well developed, in no acute distress HEENT: normal Neck: no JVD, radiation murmur to carotid or masses Cardiac: RRR; 3/6 holosystolic murmurs, rubs, or gallops,no edema  Respiratory:  Clear to auscultation bilaterally, normal work of breathing GI: soft, nontender, nondistended, + BS MS: no deformity or atrophy.  Unable to feel pulses in bilateral lower extremities.  The skin is warm and pink currently.  Dressing to the right great toe. Skin: warm and dry, no rash Neuro:  Strength and sensation are intact Psych: euthymic mood, full affect   EKG: Normal sinus rhythm, sinus bradycardia, heart rate of 53 bpm with right bundle branch  block.  Recent Labs: 07/25/2017: Magnesium 2.0 05/29/2018: TSH 3.572 06/01/2018: Hemoglobin 10.9; Platelets 209 07/08/2018: BUN 16; Creatinine, Ser 1.27; Potassium 4.0; Sodium 133    Lipid Panel    Component Value Date/Time   CHOL 99 08/06/2012 1116   TRIG 89 08/06/2012 1116   HDL 39 (L) 08/06/2012 1116   LDLCALC 42 08/06/2012 1116      Wt Readings from Last 3 Encounters:  07/15/18 218 lb (98.9 kg)  07/08/18 224 lb 6.4 oz (101.8 kg)  06/01/18 222 lb 14.2 oz (101.1 kg)      Other studies Reviewed: ABIs and Doppler study as above. Echocardiogram 11/24/2017  Left ventricle: The cavity size was normal. There was moderate   concentric hypertrophy. Systolic function was normal. The   estimated ejection fraction was in the range of 60% to 65%. Wall   motion was normal; there were no regional wall motion   abnormalities. Features are consistent with a pseudonormal left   ventricular filling pattern, with concomitant abnormal relaxation   and increased filling pressure (grade 2 diastolic dysfunction).   Doppler parameters are consistent with high  ventricular filling   pressure. - Aortic valve: Trileaflet; mildly thickened, moderately calcified   leaflets. There was moderate stenosis. Valve area (VTI): 1.47   cm^2. Valve area (Vmax): 1.4 cm^2. Valve area (Vmean): 1.38 cm^2. - Mitral valve: Mildly calcified annulus. Mildly thickened leaflets   . There was mild regurgitation. - Left atrium: The atrium was mildly to moderately dilated. - Tricuspid valve: There was mild regurgitation. - Pulmonary arteries: PA peak pressure: 31 mm Hg (S).  ASSESSMENT AND PLAN:  1.  Severe peripheral vascular disease: The patient has had bilateral ABIs with Doppler studies completed at the request of Dr.Berry, on 07/21/2018.  This revealed severe right lower extremity arterial disease with ankle-brachial indices of 0.42, with 0.0 (absent) to the great toe.  The left ABI, index 0.45.  Preliminary report revealing right total occlusion noted above the knee in the popliteal artery, with 75% and 99% stenosis noted in above-the-knee and behind the knee popliteal artery on the left.  I have spoken with Dr.Berry this morning prior to seeing the patient to discuss results.  The patient is to be planned for arteriogram of the lower extremities on Monday, July 13, as first case with Dr. Gwenlyn Found.  This is been ordered and planned for the patient.  I have discussed the procedure with the patient and his daughter in the clinic today.  I have given them a copy of the report as well.  I have answered multiple questions.  The patient is willing to proceed, risks and benefits have been discussed with the patient and he verbalizes understanding.  Preprocedure labs will be completed to include COVID screening.  He is aware that he will need to quarantine from this point on until the procedure.  He is also advised that if he has severe pain, coldness, or discoloration (turning blue or white) in either lower extremity he is to come to the hospital immediately.  Otherwise he will continue  current medication regimen.  He is advised to take oxycodone as needed for pain as he is not finding relief with Tylenol, reassuring him that the oxycodone will be more helpful to him for pain control until being seen on Monday.  Both he and his daughter verbalized understanding.  2.  Aortic valve stenosis: Significant aortic murmur is auscultated during evaluation.  Most recent echocardiogram on 11/24/2017 revealed a trileaflet aortic valve with  moderately calcified leaflets and moderate stenosis.  Valve area by VTI 1.47 cm, valve area (V-max) 1.4 cm.  Valve area (V mean) 1.38 cm.  Follow-up echocardiogram should be completed after addressing peripheral arterial disease once he becomes more active to evaluate need for TAVR or continued medical management.  3.  Hypertension: Blood pressures currently well controlled.  No changes in his medication regimen at this time.  4.  Paroxysmal atrial fibrillation: He remains on apixaban 2.5 mg daily.  He will hold his apixaban for 48 hours prior to his procedure on Monday, July 27, 2018.  Current medicines are reviewed at length with the patient today.    Labs/ tests ordered today include: Pre-Procedure labs. LEE arteriogram with possible intervention.    Phill Myron. West Pugh, ANP, AACC   07/21/2018 1:39 PM    [address] Office [phone] Fax [phone]

## 2018-07-22 ENCOUNTER — Other Ambulatory Visit: Payer: Self-pay

## 2018-07-22 ENCOUNTER — Telehealth: Payer: Self-pay | Admitting: Adult Health

## 2018-07-22 ENCOUNTER — Ambulatory Visit (HOSPITAL_COMMUNITY): Payer: Medicare Other | Admitting: Physical Therapy

## 2018-07-22 DIAGNOSIS — R600 Localized edema: Secondary | ICD-10-CM | POA: Diagnosis not present

## 2018-07-22 DIAGNOSIS — S91201A Unspecified open wound of right great toe with damage to nail, initial encounter: Secondary | ICD-10-CM

## 2018-07-22 DIAGNOSIS — S91301A Unspecified open wound, right foot, initial encounter: Secondary | ICD-10-CM | POA: Diagnosis not present

## 2018-07-22 NOTE — Telephone Encounter (Signed)

## 2018-07-22 NOTE — Therapy (Signed)
[facility] [place], [place], [address] Phone: [phone]   Fax:  [phone]  Wound Care Therapy  Patient Details  Name: [name] MRN: [id number] Date of Birth: 1927-01-24 Referring Provider (PT): [name], MD   Encounter Date: 07/22/2018  PT End of Session - 07/22/18 1143    Visit Number  14    Number of Visits  15    Date for PT Re-Evaluation  07/24/18    Authorization Type  [employer] Medicare    Authorization Time Period  06/12/18-07/24/18    Authorization - Visit Number  6    Authorization - Number of Visits  10    PT Start Time  2409    PT Stop Time  1115    PT Time Calculation (min)  30 min    Activity Tolerance  Patient tolerated treatment well    Behavior During Therapy  [facility] for tasks assessed/performed       Past Medical History:  Diagnosis Date  . Arthritis   . Borderline diabetic   . Glaucoma   . Hyperlipidemia   . Hypertension   . PVD (peripheral vascular disease) ([place])   . Sleep apnea    Cpap ordered but doesnt use    Past Surgical History:  Procedure Laterality Date  . APPENDECTOMY  1943  . CATARACT EXTRACTION  2012   x2  . COLONOSCOPY N/A 11/01/2014   Procedure: COLONOSCOPY;  Surgeon: Aviva Signs, MD;  [facility];  Service: Gastroenterology;  Laterality: N/A;  . ESOPHAGOGASTRODUODENOSCOPY N/A 11/01/2014   Procedure: ESOPHAGOGASTRODUODENOSCOPY (EGD);  Surgeon: Aviva Signs, MD;  [facility];  Service: Gastroenterology;  Laterality: N/A;  . HERNIA REPAIR Left   . INGUINAL HERNIA REPAIR Right 03/01/2013   Procedure: RIGHT INGUINAL HERNIORRHAPHY;  Surgeon: Jamesetta So, MD;  Location: [facility];  Service: General;  Laterality: Right;  . INSERTION OF MESH Right 03/01/2013   Procedure: INSERTION OF MESH;  Surgeon: Jamesetta So, MD;  Location: [facility];  Service: General;  Laterality: Right;  . NM [place]  06/05/2010   no significant ischemia  . stent   06/14/2010   left leg  . US ECHOCARDIOGRAPHY  01/21/2006   moderate mitral annular ca+, mild MR, AOV moderately sclerotic.    There were no vitals filed for this visit.              Wound Therapy - 07/22/18 1120    Subjective  Pt states he got his ABI done yesterday and has blockages in both knees.  States he continues to have groin pain and pain into Rt foot at times.  States he knows he will probably loose that toe.     Patient and Family Stated Goals  Wound to heal    Date of Onset  05/07/18   Nail removed by Dr. Paulla Dolly   Pain Scale  0-10    Pain Score  7     Pain Type  Acute pain    Pain Location  Leg    Pain Descriptors / Indicators  Aching    Evaluation and Treatment Procedures Explained to Patient/Family  Yes    Evaluation and Treatment Procedures  agreed to    Wound Properties Date First Assessed: 05/30/18 Time First Assessed: 2200 Wound Type: Other (Comment) Location: Toe (Comment  which one) , right great toe  Location Orientation: Right Wound Description (Comments): missing toenail Present on Admission: Yes  Dressing Type  Gauze (Comment);Impregnated gauze (bismuth)    Dressing Changed  Changed    Dressing Status  Old drainage    Dressing Change Frequency  PRN    Site / Wound Assessment  Brown;Black;Painful;Red;Yellow    % Wound base Red or Granulating  10%    % Wound base Yellow/Fibrinous Exudate  40%    % Wound base Black/Eschar  50%    Peri-wound Assessment  Edema;Erythema (non-blanchable);Maceration    Margins  Unattached edges (unapproximated)    Drainage Amount  Scant    Drainage Description  No odor;Serosanguineous    Treatment  Cleansed;Debridement (Selective)    Wound Properties Date First Assessed: 07/20/18 Time First Assessed: 1135 Wound Type: Other (Comment) Location: Foot Location Orientation: Upper   Dressing Type  Impregnated gauze (bismuth);Gauze (Comment)    Dressing Changed  New    Dressing Status  Clean;Dry;Intact    Site / Wound  Assessment  Granulation tissue;Pink;Red    % Wound base Red or Granulating  95%    % Wound base Yellow/Fibrinous Exudate  5%    % Wound base Black/Eschar  0%    Peri-wound Assessment  Intact    Wound Length (cm)  0.4 cm    Wound Width (cm)  0.8 cm    Wound Depth (cm)  0 cm    Wound Volume (cm^3)  0 cm^3    Wound Surface Area (cm^2)  0.32 cm^2    Drainage Amount  None    Treatment  Cleansed;Debridement (Selective)    Selective Debridement - Location  dry devitalized tissue and sloth    Selective Debridement - Tools Used  Forceps;Scissors    Selective Debridement - Tissue Removed  slough and adheared necrotic/devitalized tissue form wound bed    Wound Therapy - Clinical Statement  No improvement noted to Rt great toe with majority black, hardened necrotic tissue.  Able to debride away dry skin perimeter of toe, however suspect toe is no longer viable.  Discussed findings with daugther and she is to make decision following appoitment tomorrow as to hold therapy or continue as he does have an area on the dorsal surface of his foot.  Discussed balance therapy following amputation with daughter if this does occur.  According to daughter, patient does have severe blockages in both LE's.      Wound Therapy - Functional Problem List  walking, showering/bathing, infection risk    Factors Delaying/Impairing Wound Healing  Diabetes Mellitus;Multiple medical problems;Tobacco use;Vascular compromise   PAD, PVD, HTN, DM   Hydrotherapy Plan  Debridement;Dressing change;Patient/family education;Pulsatile lavage with suction;Other (comment)   low level laser therapy   Wound Therapy - Frequency  3X / week   3x/week for 3 weeks then 2x.week   Wound Therapy - Current Recommendations  PT    Wound Plan  measure weekly.  Follow up on visit to surgeon and POC moving forward with circulatory issues.      Dressing   silver hydrofiber, gauze, tape, netting              PT Education - 07/22/18 1144     Education Details  see assessment.; spoke to daugther regarding findings    Person(s) Educated  Patient;Child(ren)    Methods  Explanation    Comprehension  Verbalized understanding       PT Short Term Goals - 07/03/18 1412      PT SHORT TERM GOAL #1   Title  Wound will be 100% red or granulated tissue with  no slough, eshcar, or necrotic tissue present to indicate wound is moving into proliferative phase of healing.     Baseline  pt conitnues to have adherant slough and eschar to wound    Time  3    Period  Weeks    Status  On-going    Target Date  07/03/18      PT SHORT TERM GOAL #2   Title  Patient will no longer have edema or errythema present around periwound indicating no persisting signs of local infection.    Baseline  errythema and edam persists    Time  3    Period  Weeks    Status  On-going        PT Long Term Goals - 07/03/18 1413      PT LONG TERM GOAL #1   Title  Wound size with be 25% reduced for surface area indicating good proliferation of wound healing.     Time  6    Period  Weeks    Status  On-going    Target Date  07/24/18      PT LONG TERM GOAL #2   Title  Patient and daughter will demonstrate proper self care technique to complete wound care management at home when appropriate and wound healing progressed enough.    Time  6    Period  Weeks    Status  On-going              Patient will benefit from skilled therapeutic intervention in order to improve the following deficits and impairments:     Visit Diagnosis: 1. Open wound of right great toe with damage to nail, initial encounter   2. Localized edema        Problem List Patient Active Problem List   Diagnosis Date Noted  . Type 2 diabetes mellitus with foot ulcer, without long-term current use of insulin ([place])   . Gastroesophageal reflux disease   . Cellulitis of great toe of right foot 05/29/2018  . Atrial fibrillation, chronic   . Chest pain 07/25/2017  . PAF (paroxysmal  atrial fibrillation) ([place]) 07/25/2017  . BPH (benign prostatic hyperplasia) 07/25/2017  . Shortness of breath 05/11/2017  . Hypothyroidism 05/11/2017  . Chronic diastolic CHF (congestive heart failure) ([place]) 05/11/2017  . Aortic valve stenosis 06/12/2016  . RBBB 06/12/2016  . PAD (peripheral artery disease) ([place]) 08/04/2012  . Essential hypertension 08/04/2012  . Hyperlipidemia 08/04/2012  . Dizziness 08/04/2012   Teena Irani, PTA/CLT [phone]  Teena Irani 07/22/2018, 11:45 AM  [place], [place], [address] Phone: [phone]   Fax:  [phone]  Name: [name] MRN: [id number] Date of Birth: 1928-01-07

## 2018-07-23 ENCOUNTER — Other Ambulatory Visit: Payer: Self-pay

## 2018-07-23 ENCOUNTER — Ambulatory Visit (INDEPENDENT_AMBULATORY_CARE_PROVIDER_SITE_OTHER): Payer: Medicare Other | Admitting: Adult Health

## 2018-07-23 ENCOUNTER — Encounter: Payer: Self-pay | Admitting: Adult Health

## 2018-07-23 ENCOUNTER — Other Ambulatory Visit (HOSPITAL_COMMUNITY)
Admission: RE | Admit: 2018-07-23 | Discharge: 2018-07-23 | Disposition: A | Discharge status: Home / Self Care | Payer: Medicare Other | Source: Ambulatory Visit | Attending: Cardiovascular Disease | Admitting: Cardiovascular Disease

## 2018-07-23 VITALS — BP 128/50 | HR 53 | Temp 97.8°F | Ht 71.5 in | Wt 221.0 lb

## 2018-07-23 DIAGNOSIS — I739 Peripheral vascular disease, unspecified: Secondary | ICD-10-CM

## 2018-07-23 DIAGNOSIS — I350 Nonrheumatic aortic (valve) stenosis: Secondary | ICD-10-CM | POA: Diagnosis not present

## 2018-07-23 DIAGNOSIS — Z01812 Encounter for preprocedural laboratory examination: Secondary | ICD-10-CM | POA: Diagnosis not present

## 2018-07-23 DIAGNOSIS — Z1159 Encounter for screening for other viral diseases: Secondary | ICD-10-CM | POA: Insufficient documentation

## 2018-07-23 DIAGNOSIS — I480 Paroxysmal atrial fibrillation: Secondary | ICD-10-CM | POA: Diagnosis not present

## 2018-07-23 DIAGNOSIS — I10 Essential (primary) hypertension: Secondary | ICD-10-CM | POA: Diagnosis not present

## 2018-07-23 MED ORDER — DIPHENHYDRAMINE HCL 50 MG PO TABS: ORAL_TABLET | ORAL | 0 refills | Status: DC

## 2018-07-23 MED ORDER — PREDNISONE 50 MG PO TABS: ORAL_TABLET | ORAL | 0 refills | Status: DC

## 2018-07-23 NOTE — Patient Instructions (Addendum)
[place] [address] Dept: [phone] Loc: [place]  07/23/2018  You are scheduled for a Peripheral Angiogram on Monday, July 13 with Dr. [name].  1. Please arrive at the [facility] (Main Entrance A) at [facility]: [address] [place], [place] [address] at 5:30 AM (This time is two hours before your procedure to ensure your preparation). Free valet parking service is available.   Special note: Every effort is made to have your procedure done on time. Please understand that emergencies sometimes delay scheduled procedures.  2. Diet: Do not eat solid foods after midnight.  The patient may have clear liquids until 5am upon the day of the procedure.  3. Labs: You will need to have blood drawn on  Go to:  [facility] at [facility] [address], [place], [place] [address] FOR YOUR BASIC METABOLIC PANEL, COMPLETE BLOOD COUNT, AND THYROID STIMULATING HORMONE LAB WORK. NO APPOINTMENT IS NEEDED. YOU MUST HAVE THIS LAB WORK DONE BEFORE GOING TO GET YOUR COVID-19 TEST DONE BECAUSE YOU WILL NEED TO QUARANTINE YOURSELF AFTER THE COVID-19 TEST UNTIL THE DAY OF YOUR [place].  Go to: [facility]  [address] [place], [place] [address] FOR YOUR COVID-19 TEST. YOU MUST HAVE YOUR COVID-19 TEST COMPLETED 3 DAYS PRIOR TO YOUR UPCOMING PROCEDURE/TEST. YOU WILL NEED AN APPOINTMENT. YOU WILL ALSO NEED TO QUARANTINE YOURSELF AFTER THE COVID-19 TEST UNTIL THE DAY OF YOUR PROCEDURE/TEST. RESULTS WILL  BE POSTED IN OUR SYSTEM IN 48 HOURS OR LESS. YOUR APPOINTMENT IS 07/23/2018 AT 1:25PM  4. Medication instructions in preparation for your procedure:   Contrast Allergy: Yes, Please take Prednisone 50mg  by mouth at: Thirteen hours prior to cath: 6:30PM ON 07/26/2018 Seven hours prior to cath:  12:30 AM ON 07/27/2018  And prior to leaving home please take last dose of Prednisone 50mg  and Benadryl 50mg  by mouth.  PLEASE HOLD YOUR ELIQUIS 2 DAYS PRIOR TO YOUR PROCEDURE.  DO NOT TAKE YOUR GLIPIZIDE (GLUCOTROL) ON THE MORNING OF YOUR PROCEDURE.   On the morning of your procedure, take any morning medicines NOT listed above.  You may use sips of water.  5. Plan for one night stay--bring personal belongings. 6. Bring a current list of your medications and current insurance cards. 7. You MUST have a responsible person to drive you home. 8. Someone MUST be with you the first 24 hours after you arrive home or your discharge will be delayed. 9. Please wear clothes that are easy to get on and off and wear slip-on shoes.   TESTS:  Your physician has requested that you have a lower or upper extremity arterial duplex. This test is an ultrasound of the arteries in the legs or arms. It looks at arterial blood flow in the legs and arms. Allow one hour for Lower and Upper Arterial scans. There are no restrictions or special instructions. TO BE SCHEDULED 1 WEEK AFTER YOUR PROCEDURE  Your physician has requested that you have an ankle brachial index (ABI). During this test an ultrasound and blood pressure cuff are used to evaluate the arteries that supply the arms and legs with blood. Allow thirty minutes for this exam. There are no restrictions or special instructions. TO BE SCHEDULED 1 WEEK AFTER YOUR PROCEDURE  FOLLOW UP: PLEASE FOLLOW UP WITH DR. Gwenlyn Found 2-3 WEEKS AFTER YOUR PROCEDURE.  Thank you for allowing [place] to care for you!   --  Invasive Cardiovascular services

## 2018-07-24 ENCOUNTER — Ambulatory Visit (HOSPITAL_COMMUNITY): Payer: Medicare Other | Admitting: Physical Therapy

## 2018-07-24 ENCOUNTER — Telehealth: Payer: Self-pay

## 2018-07-24 LAB — CBC
Hematocrit: 30.4 % — ABNORMAL LOW (ref 37.5–51.0)
Hemoglobin: 10.4 g/dL — ABNORMAL LOW (ref 13.0–17.7)
MCH: 32.4 pg (ref 26.6–33.0)
MCHC: 34.2 g/dL (ref 31.5–35.7)
MCV: 95 fL (ref 79–97)
Platelets: 255 10*3/uL (ref 150–450)
RBC: 3.21 x10E6/uL — ABNORMAL LOW (ref 4.14–5.80)
RDW: 12.3 % (ref 11.6–15.4)
WBC: 10.6 10*3/uL (ref 3.4–10.8)

## 2018-07-24 LAB — SARS CORONAVIRUS 2 (TAT 6-24 HRS): SARS Coronavirus 2: NEGATIVE

## 2018-07-24 LAB — BASIC METABOLIC PANEL
BUN/Creatinine Ratio: 20 (ref 10–24)
BUN: 26 mg/dL (ref 8–27)
CO2: 23 mmol/L (ref 20–29)
Calcium: 9.1 mg/dL (ref 8.6–10.2)
Chloride: 91 mmol/L — ABNORMAL LOW (ref 96–106)
Creatinine, Ser: 1.29 mg/dL — ABNORMAL HIGH (ref 0.76–1.27)
GFR calc Af Amer: 57 mL/min/{1.73_m2} — ABNORMAL LOW (ref >59)
GFR calc non Af Amer: 49 mL/min/{1.73_m2} — ABNORMAL LOW (ref >59)
Glucose: 102 mg/dL — ABNORMAL HIGH (ref 65–99)
Potassium: 4.4 mmol/L (ref 3.5–5.2)
Sodium: 127 mmol/L — ABNORMAL LOW (ref 134–144)

## 2018-07-24 LAB — TSH: TSH: 3.02 u[IU]/mL (ref 0.450–4.500)

## 2018-07-24 NOTE — Telephone Encounter (Signed)
Called patient and he gave me verbal permission to speak with his daughter about the following pre-procedure instructions. The following pre-procedure instructions were reviewed with the patients daughter. She verbalized understanding and had no questions at this time.  Pt contacted pre-catheterization scheduled at [facility] for: 7:30 am on 07/27/18 Verified arrival time and place: [place] Entrance A at: 5:30 am  Covid-19 test date:07/23/18  No solid food after midnight prior to cath, clear liquids until 5 AM day of procedure. Contrast allergy: Yes Patient was given prescriptions for benadryl and prednisone to perform the Benadryl and Prednisone prep. Reviewed this prep with the patient's daughter.  Verified no diabetes medications: Patient is on diabetic medications  AM meds can be  taken pre-cath with sip of water including: ASA 81 mg  Patient is instructed to hold his morning lasix dose  Patient is instructed to hold the eliquis for two days before the procedure  Patient is instructed to hold his diabetic medications the morning of the procedure.  Confirmed patient has responsible person to drive home post procedure and observe 24 hours after arriving home: Patient has a responsible person to drive them home after the procedure and observe them for 24 hours after arriving home.      COVID-19 Pre-Screening Questions:  . In the past 7 to 10 days have you had a cough,  shortness of breath, headache, congestion, fever (100 or greater) body aches, chills, sore throat, or sudden loss of taste or sense of smell? No . Have you been around anyone with known Covid 19? No . Have you been around anyone who is awaiting Covid 19 test results in the past 7 to 10 days? No . Have you been around anyone who has been exposed to Covid 19, or has mentioned symptoms of Covid 19 within the past 7 to 10 days? No  If you have any concerns/questions about symptoms patients report during  screening (either on the phone or at threshold). Contact the provider seeing the patient or DOD for further guidance.  If neither are available contact a member of the leadership team.

## 2018-07-27 ENCOUNTER — Other Ambulatory Visit: Payer: Self-pay

## 2018-07-27 ENCOUNTER — Telehealth: Payer: Self-pay | Admitting: Medical

## 2018-07-27 ENCOUNTER — Encounter (HOSPITAL_COMMUNITY): Payer: Self-pay | Admitting: Cardiovascular Disease

## 2018-07-27 ENCOUNTER — Encounter (HOSPITAL_COMMUNITY)
Admission: RE | Disposition: A | Discharge status: Home / Self Care | Payer: Self-pay | Source: Home / Self Care | Attending: Cardiovascular Disease

## 2018-07-27 ENCOUNTER — Ambulatory Visit (HOSPITAL_COMMUNITY): Payer: Medicare Other

## 2018-07-27 ENCOUNTER — Ambulatory Visit (HOSPITAL_COMMUNITY)
Admission: RE | Admit: 2018-07-27 | Discharge: 2018-07-27 | Disposition: A | Discharge status: Home / Self Care | Payer: Medicare Other | Attending: Cardiovascular Disease | Admitting: Cardiovascular Disease

## 2018-07-27 DIAGNOSIS — E785 Hyperlipidemia, unspecified: Secondary | ICD-10-CM | POA: Insufficient documentation

## 2018-07-27 DIAGNOSIS — I480 Paroxysmal atrial fibrillation: Secondary | ICD-10-CM | POA: Insufficient documentation

## 2018-07-27 DIAGNOSIS — I70261 Atherosclerosis of native arteries of extremities with gangrene, right leg: Secondary | ICD-10-CM | POA: Diagnosis not present

## 2018-07-27 DIAGNOSIS — E1151 Type 2 diabetes mellitus with diabetic peripheral angiopathy without gangrene: Secondary | ICD-10-CM | POA: Diagnosis not present

## 2018-07-27 DIAGNOSIS — Z888 Allergy status to other drugs, medicaments and biological substances status: Secondary | ICD-10-CM | POA: Insufficient documentation

## 2018-07-27 DIAGNOSIS — Z79899 Other long term (current) drug therapy: Secondary | ICD-10-CM | POA: Insufficient documentation

## 2018-07-27 DIAGNOSIS — Z87891 Personal history of nicotine dependence: Secondary | ICD-10-CM | POA: Insufficient documentation

## 2018-07-27 DIAGNOSIS — I350 Nonrheumatic aortic (valve) stenosis: Secondary | ICD-10-CM | POA: Insufficient documentation

## 2018-07-27 DIAGNOSIS — I739 Peripheral vascular disease, unspecified: Secondary | ICD-10-CM

## 2018-07-27 DIAGNOSIS — G4730 Sleep apnea, unspecified: Secondary | ICD-10-CM | POA: Insufficient documentation

## 2018-07-27 DIAGNOSIS — Z8249 Family history of ischemic heart disease and other diseases of the circulatory system: Secondary | ICD-10-CM | POA: Diagnosis not present

## 2018-07-27 DIAGNOSIS — I10 Essential (primary) hypertension: Secondary | ICD-10-CM | POA: Diagnosis not present

## 2018-07-27 DIAGNOSIS — Z7901 Long term (current) use of anticoagulants: Secondary | ICD-10-CM | POA: Insufficient documentation

## 2018-07-27 DIAGNOSIS — M1990 Unspecified osteoarthritis, unspecified site: Secondary | ICD-10-CM | POA: Diagnosis not present

## 2018-07-27 DIAGNOSIS — H409 Unspecified glaucoma: Secondary | ICD-10-CM | POA: Diagnosis not present

## 2018-07-27 DIAGNOSIS — Z7984 Long term (current) use of oral hypoglycemic drugs: Secondary | ICD-10-CM | POA: Insufficient documentation

## 2018-07-27 DIAGNOSIS — I4510 Unspecified right bundle-branch block: Secondary | ICD-10-CM | POA: Insufficient documentation

## 2018-07-27 DIAGNOSIS — I998 Other disorder of circulatory system: Secondary | ICD-10-CM | POA: Diagnosis not present

## 2018-07-27 DIAGNOSIS — I70202 Unspecified atherosclerosis of native arteries of extremities, left leg: Secondary | ICD-10-CM | POA: Diagnosis not present

## 2018-07-27 HISTORY — PX: ABDOMINAL AORTOGRAM W/LOWER EXTREMITY: CATH118223

## 2018-07-27 LAB — GLUCOSE, CAPILLARY
Glucose-Capillary: 236 mg/dL — ABNORMAL HIGH (ref 70–99)
Glucose-Capillary: 101 mg/dL — ABNORMAL HIGH (ref 70–99)

## 2018-07-27 SURGERY — ABDOMINAL AORTOGRAM W/LOWER EXTREMITY
Anesthesia: LOCAL

## 2018-07-27 MED ORDER — SODIUM CHLORIDE 0.9 % IV SOLN: 250.0000 mL | INTRAVENOUS | Status: DC | PRN

## 2018-07-27 MED ORDER — ISOSORBIDE MONONITRATE ER 30 MG PO TB24: 15.0000 mg | ORAL_TABLET | Freq: Every day | ORAL | 3 refills | Status: DC

## 2018-07-27 MED ORDER — SODIUM CHLORIDE 0.9 % IV SOLN: INTRAVENOUS | Status: AC

## 2018-07-27 MED ORDER — SODIUM CHLORIDE 0.9 % WEIGHT BASED INFUSION
3.0000 mL/kg/h | INTRAVENOUS | Status: AC
  Administered 2018-07-27: 3 mL/kg/h via INTRAVENOUS

## 2018-07-27 MED ORDER — HYDRALAZINE HCL 20 MG/ML IJ SOLN
5.0000 mg | INTRAMUSCULAR | Status: DC | PRN
  Administered 2018-07-27: 5 mg via INTRAVENOUS

## 2018-07-27 MED ORDER — LIDOCAINE HCL (PF) 1 % IJ SOLN
INTRAMUSCULAR | Status: AC
  Filled 2018-07-27: qty 30

## 2018-07-27 MED ORDER — SODIUM CHLORIDE 0.9% FLUSH: 3.0000 mL | INTRAVENOUS | Status: DC | PRN

## 2018-07-27 MED ORDER — HEPARIN (PORCINE) IN NACL 1000-0.9 UT/500ML-% IV SOLN
INTRAVENOUS | Status: AC
  Filled 2018-07-27: qty 1000

## 2018-07-27 MED ORDER — HYDRALAZINE HCL 20 MG/ML IJ SOLN
INTRAMUSCULAR | Status: AC
  Filled 2018-07-27: qty 1

## 2018-07-27 MED ORDER — NITROGLYCERIN 0.4 MG SL SUBL: 0.4000 mg | SUBLINGUAL_TABLET | SUBLINGUAL | Status: DC | PRN

## 2018-07-27 MED ORDER — FENTANYL CITRATE (PF) 100 MCG/2ML IJ SOLN
INTRAMUSCULAR | Status: DC | PRN
  Administered 2018-07-27: 25 ug via INTRAVENOUS

## 2018-07-27 MED ORDER — MORPHINE SULFATE (PF) 10 MG/ML IV SOLN: 2.0000 mg | INTRAVENOUS | Status: DC | PRN

## 2018-07-27 MED ORDER — ATORVASTATIN CALCIUM 80 MG PO TABS: 80.0000 mg | ORAL_TABLET | Freq: Every day | ORAL | Status: DC

## 2018-07-27 MED ORDER — MIDAZOLAM HCL 2 MG/2ML IJ SOLN
INTRAMUSCULAR | Status: DC | PRN
  Administered 2018-07-27: 1 mg via INTRAVENOUS

## 2018-07-27 MED ORDER — ONDANSETRON HCL 4 MG/2ML IJ SOLN: 4.0000 mg | Freq: Four times a day (QID) | INTRAMUSCULAR | Status: DC | PRN

## 2018-07-27 MED ORDER — ASPIRIN 81 MG PO CHEW: 81.0000 mg | CHEWABLE_TABLET | ORAL | Status: DC

## 2018-07-27 MED ORDER — SODIUM CHLORIDE 0.9% FLUSH: 3.0000 mL | Freq: Two times a day (BID) | INTRAVENOUS | Status: DC

## 2018-07-27 MED ORDER — ACETAMINOPHEN 325 MG PO TABS: 650.0000 mg | ORAL_TABLET | ORAL | Status: DC | PRN

## 2018-07-27 MED ORDER — NITROGLYCERIN 0.4 MG SL SUBL
SUBLINGUAL_TABLET | SUBLINGUAL | Status: AC
  Administered 2018-07-27 (×2): 0.4 mg
  Filled 2018-07-27: qty 1

## 2018-07-27 MED ORDER — FENTANYL CITRATE (PF) 100 MCG/2ML IJ SOLN
INTRAMUSCULAR | Status: AC
  Filled 2018-07-27: qty 2

## 2018-07-27 MED ORDER — ASPIRIN EC 81 MG PO TBEC: 81.0000 mg | DELAYED_RELEASE_TABLET | Freq: Every day | ORAL | Status: DC

## 2018-07-27 MED ORDER — SODIUM CHLORIDE 0.9 % WEIGHT BASED INFUSION: 1.0000 mL/kg/h | INTRAVENOUS | Status: DC

## 2018-07-27 MED ORDER — MIDAZOLAM HCL 2 MG/2ML IJ SOLN
INTRAMUSCULAR | Status: AC
  Filled 2018-07-27: qty 2

## 2018-07-27 MED ORDER — SODIUM CHLORIDE 0.9 % IV SOLN: INTRAVENOUS | Status: DC

## 2018-07-27 MED ORDER — LABETALOL HCL 5 MG/ML IV SOLN: 10.0000 mg | INTRAVENOUS | Status: DC | PRN

## 2018-07-27 MED ORDER — LIDOCAINE HCL (PF) 1 % IJ SOLN
INTRAMUSCULAR | Status: DC | PRN
  Administered 2018-07-27: 28 mL

## 2018-07-27 MED ORDER — IODIXANOL 320 MG/ML IV SOLN
INTRAVENOUS | Status: DC | PRN
  Administered 2018-07-27: 135 mL via INTRA_ARTERIAL

## 2018-07-27 SURGICAL SUPPLY — 11 items
CATH ANGIO 5F PIGTAIL 65CM (CATHETERS) ×2 IMPLANT
CATH CROSS OVER TEMPO 5F (CATHETERS) ×2 IMPLANT
CATH STRAIGHT 5FR 65CM (CATHETERS) ×2 IMPLANT
KIT PV (KITS) ×2 IMPLANT
SHEATH PINNACLE 5F 10CM (SHEATH) ×2 IMPLANT
STOPCOCK MORSE 400PSI 3WAY (MISCELLANEOUS) ×2 IMPLANT
SYR MEDRAD MARK 7 150ML (SYRINGE) ×2 IMPLANT
TRANSDUCER W/STOPCOCK (MISCELLANEOUS) ×2 IMPLANT
TRAY PV CATH (CUSTOM PROCEDURE TRAY) ×2 IMPLANT
TUBING CIL FLEX 10 FLL-RA (TUBING) ×2 IMPLANT
WIRE HITORQ VERSACORE ST 145CM (WIRE) ×2 IMPLANT

## 2018-07-27 NOTE — Progress Notes (Signed)
Site area: left femoral artery Site Prior to Removal:  Level 0 Pressure Applied For: 20 minutes Manual:   yes Patient Status During Pull:  stable Post Pull Site:  Level 0 Post Pull Instructions Given:  yes Post Pull Pulses Present: doppler Dressing Applied:  tegaderm Bedrest begins @ 905 Comments: patient tolerated well

## 2018-07-27 NOTE — Discharge Instructions (Signed)
Restart eliquis tomorrow 7/14  Angiogram, Care After This sheet gives you information about how to care for yourself after your procedure. Your health care provider may also give you more specific instructions. If you have problems or questions, contact your health care provider. What can I expect after the procedure? After the procedure, it is common to have bruising and tenderness at the catheter insertion area. Follow these instructions at home: Insertion site care  Follow instructions from your health care provider about how to take care of your insertion site. Make sure you: ? Wash your hands with soap and water before you change your bandage (dressing). If soap and water are not available, use hand sanitizer. ? Change your dressing as told by your health care provider. ? Leave stitches (sutures), skin glue, or adhesive strips in place. These skin closures may need to stay in place for 2 weeks or longer. If adhesive strip edges start to loosen and curl up, you may trim the loose edges. Do not remove adhesive strips completely unless your health care provider tells you to do that.  Do not take baths, swim, or use a hot tub until your health care provider approves.  You may shower 24-48 hours after the procedure or as told by your health care provider. ? Gently wash the site with plain soap and water. ? Pat the area dry with a clean towel. ? Do not rub the site. This may cause bleeding.  Do not apply powder or lotion to the site. Keep the site clean and dry.  Check your insertion site every day for signs of infection. Check for: ? Redness, swelling, or pain. ? Fluid or blood. ? Warmth. ? Pus or a bad smell. Activity  Rest as told by your health care provider, usually for 1-2 days.  Do not lift anything that is heavier than 10 lbs. (4.5 kg) or as told by your health care provider.  Do not drive for 24 hours if you were given a medicine to help you relax (sedative).  Do not drive  or use heavy machinery while taking prescription pain medicine. General instructions   Return to your normal activities as told by your health care provider, usually in about a week. Ask your health care provider what activities are safe for you.  If the catheter site starts bleeding, lie flat and put pressure on the site. If the bleeding does not stop, get help right away. This is a medical emergency.  Drink enough fluid to keep your urine clear or pale yellow. This helps flush the contrast dye from your body.  Take over-the-counter and prescription medicines only as told by your health care provider.  Keep all follow-up visits as told by your health care provider. This is important. Contact a health care provider if:  You have a fever or chills.  You have redness, swelling, or pain around your insertion site.  You have fluid or blood coming from your insertion site.  The insertion site feels warm to the touch.  You have pus or a bad smell coming from your insertion site.  You have bruising around the insertion site.  You notice blood collecting in the tissue around the catheter site (hematoma). The hematoma may be painful to the touch. Get help right away if:  You have severe pain at the catheter insertion area.  The catheter insertion area swells very fast.  The catheter insertion area is bleeding, and the bleeding does not stop when you hold  steady pressure on the area.  The area near or just beyond the catheter insertion site becomes pale, cool, tingly, or numb. These symptoms may represent a serious problem that is an emergency. Do not wait to see if the symptoms will go away. Get medical help right away. Call your local emergency services (911 in the [place] Do not drive yourself to the hospital. Summary  After the procedure, it is common to have bruising and tenderness at the catheter insertion area.  After the procedure, it is important to rest and drink plenty of  fluids.  Do not take baths, swim, or use a hot tub until your health care provider says it is okay to do so. You may shower 24-48 hours after the procedure or as told by your health care provider.  If the catheter site starts bleeding, lie flat and put pressure on the site. If the bleeding does not stop, get help right away. This is a medical emergency. This information is not intended to replace advice given to you by your health care provider. Make sure you discuss any questions you have with your health care provider. Document Released: 07/19/2004 Document Revised: 12/13/2016 Document Reviewed: 12/06/2015 Elsevier Patient Education  2020 [employer].

## 2018-07-27 NOTE — Interval H&P Note (Signed)
History and Physical Interval Note:  07/27/2018 7:43 AM  [name]  has presented today for surgery, with the diagnosis of peripheral vascular disease.  The various methods of treatment have been discussed with the patient and family. After consideration of risks, benefits and other options for treatment, the patient has consented to  Procedure(s): ABDOMINAL AORTOGRAM W/LOWER EXTREMITY (N/A) as a surgical intervention.  The patient's history has been reviewed, patient examined, no change in status, stable for surgery.  I have reviewed the patient's chart and labs.  Questions were answered to the patient's satisfaction.     [name]

## 2018-07-27 NOTE — Progress Notes (Signed)
   Notified by RN that patient reported substernal chest pain following his PV procedure this morning. He denied any associated symptoms. He was given SL nitro x2 which resolved his chest pain. He reports having similar episodes 2-3 times per week, which seem to be brought on by exertion.   Vitals:   07/27/18 1027 07/27/18 1032  BP: 130/64 134/62  Pulse: 76 72  Resp:    Temp:    SpO2: 98% 98%   GEN: Upon entering the room, he is laying in bed talking on the telephone in no acute distress.   Neck: No JVD, no carotid bruits Cardiac: RRR, +murmurs, no rubs or gallops.  Respiratory: Clear to auscultation bilaterally anteriorly, no wheezes/ rales/ rhonchi GI: NABS, Soft, nontender, non-distended  MS: 2+ LE edema (L>R); No deformity. Neuro:  Nonfocal, moving all extremities spontaneously Psych: Normal affect   A/P: 85 y.o. male with PMH of HTN, HLD, PVD s/p stent to R SFA in 5003, chronic diastolic CHF, moderate aortic stenosis, paroxysmal atrial fibrillation on eliquis, DM type 2, hypothyroidism, BPH, CKD stage 3, and GERD  1. Chest pain: suspect he does have underlying CAD. EKG at the time of chest pain showed sinus rhythm with chronic 1st degree AV block and RBBB, no STE/D, no TWI.  - Will check a high sensitivity troponin x2 (q2h) - if negative, will plan to start imdur 15mg  daily for management of angina. If positive, will likely admit for further evaluation.   2. HTN: BP elevated this morning, improved after receiving SL nitro.  - Continue to monitor - can use PRN medications ordered for now - Anticipate resuming home doxazosin, metoprolol, and lasix at discharge  3. PVD: patient presented for peripheral angiography today which showed high-grade calcified distal R SFA stenosis extending into the P1 segment of the popliteal artery. He was recommended for staged diamondback orbital rotational arthrectomy, PTA next week  - Will arrange outpatient follow-up with Dr. Gwenlyn Found on Friday for  close monitoring and to coordinate staged procedure.   4. Paroxysmal atrial fibrillation: EKG with sinus rhythm.  - Plan to resume apixaban tomorrow - Continue metoprolol for rate control  5. Chronic diastolic CHF: still with bilateral LE edema, though improved from when I saw him last 06/2018.  - Continue lasix 40mg  daily - Continue metoprolol  Abigail Butts, PA-C 07/27/18; 12:22 PM

## 2018-07-27 NOTE — Progress Notes (Addendum)
C/o cp 5/10, pt requesting nitro// ok per Dr Gwenlyn Found, ekg being done, O2 [place] placed at 4 L, vitals stable , Dr Gwenlyn Found was informed, requested PA to see him, PA Daleen Snook K. paged / returned page.

## 2018-07-27 NOTE — Addendum Note (Signed)
Addended by: Jacques Navy on: 07/27/2018 08:32 AM   Modules accepted: Orders

## 2018-07-27 NOTE — Telephone Encounter (Signed)
   Spoke with patient's daughter, [name]. Patient was discharged home prior to having troponin's drawn to further evaluate chest pain. Recommended close monitoring for recurrent chest pain over the next several days and to have a low threshold to return to the ED should chest pain reoccur. A prescription for imdur 15mg  daily was sent to his pharmacy. He is scheduled to see Dr. Gwenlyn Found 07/31/2018 at 11:15am. Dr. Gwenlyn Found was in agreement with the plan.  Abigail Butts, PA-C 07/27/18; 2:45 PM

## 2018-07-27 NOTE — Progress Notes (Signed)
Had benadryl// sleepy

## 2018-07-28 ENCOUNTER — Ambulatory Visit: Payer: Medicare Other | Admitting: Cardiovascular Disease

## 2018-07-28 MED FILL — Heparin Sod (Porcine)-NaCl IV Soln 1000 Unit/500ML-0.9%: INTRAVENOUS | Qty: 1000 | Status: AC

## 2018-07-29 ENCOUNTER — Ambulatory Visit (HOSPITAL_COMMUNITY): Payer: Medicare Other

## 2018-07-29 ENCOUNTER — Telehealth: Payer: Self-pay | Admitting: Cardiovascular Disease

## 2018-07-29 NOTE — Telephone Encounter (Signed)
Returned call to daughter who is requesting another PV procedure for her father. Patient has OV on 7/17 with Dr. Gwenlyn Found   Patient is having continual pain in RLE. Feels like knife going into bottom of foot, goes up his leg. He has a non-healing wound on this foot/toe.   His leg that he had PV procedure on is doing great, per daughter  She reports his BP has been up, but is back down now: 172/74 HR 82 156/67 HR 20  Advised MD is in the office tomorrow and Friday so not able to do procedure. Will route to Dr. Gwenlyn Found & his nurse as Juluis Rainier in anticipation of Friday's visit

## 2018-07-29 NOTE — Telephone Encounter (Signed)
  Daughter is calling because her father had a peripheral vascular catheterization on him on 07/27/18 on his left leg. He is having issues with his right leg right now. He has a wound that is not healing, experiencing pain that feels like a knife going into bottom of his foot and radiates up his leg. He states that the pain is continuous. Daughter would like to see if he can have the procedure done to his right leg soon. She also states that his blood pressure was up a little today but it has gone down now.

## 2018-07-30 ENCOUNTER — Telehealth: Payer: Self-pay | Admitting: Cardiovascular Disease

## 2018-07-30 NOTE — Telephone Encounter (Signed)
Scheduled to see him tomorrow in the office anticipation of doing a procedure on Monday

## 2018-07-30 NOTE — Telephone Encounter (Signed)

## 2018-07-31 ENCOUNTER — Other Ambulatory Visit (HOSPITAL_COMMUNITY)
Admission: RE | Admit: 2018-07-31 | Discharge: 2018-07-31 | Disposition: A | Discharge status: Home / Self Care | Payer: Medicare Other | Source: Ambulatory Visit | Attending: Cardiovascular Disease | Admitting: Cardiovascular Disease

## 2018-07-31 ENCOUNTER — Other Ambulatory Visit: Payer: Self-pay

## 2018-07-31 ENCOUNTER — Telehealth: Payer: Self-pay | Admitting: Cardiovascular Disease

## 2018-07-31 ENCOUNTER — Ambulatory Visit (INDEPENDENT_AMBULATORY_CARE_PROVIDER_SITE_OTHER): Payer: Medicare Other | Admitting: Cardiovascular Disease

## 2018-07-31 ENCOUNTER — Telehealth (HOSPITAL_COMMUNITY): Payer: Self-pay

## 2018-07-31 ENCOUNTER — Encounter: Payer: Self-pay | Admitting: Cardiovascular Disease

## 2018-07-31 ENCOUNTER — Ambulatory Visit (HOSPITAL_COMMUNITY): Payer: Medicare Other

## 2018-07-31 DIAGNOSIS — I739 Peripheral vascular disease, unspecified: Secondary | ICD-10-CM

## 2018-07-31 DIAGNOSIS — Z1159 Encounter for screening for other viral diseases: Secondary | ICD-10-CM | POA: Diagnosis not present

## 2018-07-31 DIAGNOSIS — I10 Essential (primary) hypertension: Secondary | ICD-10-CM

## 2018-07-31 LAB — SARS CORONAVIRUS 2 (TAT 6-24 HRS): SARS Coronavirus 2: NEGATIVE

## 2018-07-31 MED ORDER — SODIUM CHLORIDE 0.9% FLUSH: 3.0000 mL | Freq: Two times a day (BID) | INTRAVENOUS | Status: DC

## 2018-07-31 MED ORDER — SODIUM CHLORIDE 0.9 % WEIGHT BASED INFUSION: 1.0000 mL/kg/h | INTRAVENOUS | Status: DC

## 2018-07-31 MED ORDER — SODIUM CHLORIDE 0.9 % IV SOLN: 250.0000 mL | INTRAVENOUS | Status: DC | PRN

## 2018-07-31 MED ORDER — SODIUM CHLORIDE 0.9 % WEIGHT BASED INFUSION: 3.0000 mL/kg/h | INTRAVENOUS | Status: AC

## 2018-07-31 MED ORDER — ASPIRIN EC 81 MG PO TBEC: 81.0000 mg | DELAYED_RELEASE_TABLET | Freq: Once | ORAL | 0 refills | Status: AC

## 2018-07-31 MED ORDER — SODIUM CHLORIDE 0.9% FLUSH: 3.0000 mL | INTRAVENOUS | Status: DC | PRN

## 2018-07-31 MED ORDER — DIPHENHYDRAMINE HCL 50 MG PO TABS: ORAL_TABLET | ORAL | 0 refills | Status: DC

## 2018-07-31 MED ORDER — PREDNISONE 50 MG PO TABS: ORAL_TABLET | ORAL | 0 refills | Status: DC

## 2018-07-31 MED ORDER — ASPIRIN 81 MG PO CHEW: 81.0000 mg | CHEWABLE_TABLET | ORAL | Status: AC

## 2018-07-31 NOTE — Patient Instructions (Addendum)
    [place] [address] Dept: [phone] Loc: [place]  07/31/2018  You are scheduled for a Peripheral Angiogram on Monday, July 20 with Dr. [name].  1. Please arrive at the [facility] (Main Entrance A) at [facility]: [address] [place], [place] [address] at 7:30 AM (This time is two hours before your procedure to ensure your preparation). Free valet parking service is available.   Special note: Every effort is made to have your procedure done on time. Please understand that emergencies sometimes delay scheduled procedures.  2. Diet: Do not eat solid foods after midnight.  The patient may have clear liquids until 5am upon the day of the procedure.  LABS:  Go to: [facility]  [address] [place], [place] [address] FOR YOUR COVID-19 TEST. YOU MUST HAVE YOUR COVID-19 TEST COMPLETED 3 DAYS PRIOR TO YOUR UPCOMING PROCEDURE/TEST. YOU WILL NEED AN APPOINTMENT. YOUR APPOINTMENT IS 07/31/2018 AT 1:30PM.  YOU WILL ALSO NEED TO QUARANTINE YOURSELF AFTER THE COVID-19 TEST UNTIL THE DAY OF YOUR PROCEDURE/TEST. RESULTS WILL  BE POSTED IN OUR SYSTEM IN 48 HOURS OR LESS.   4. Medication instructions in preparation for your procedure:   . If the patient has contrast allergy: ? Patient will need a prescription for Prednisone and very clear instructions (as follows): 1. Prednisone 50 mg - take 13 hours prior to test 2. Take another Prednisone 50 mg 7 hours prior to test 3. Take another Prednisone 50 mg 1 hour prior to test 4. Take Benadryl 50 mg 1 hour prior to test . Patient must complete all four doses of above prophylactic medications.   PLEASE HOLD YOUR ELIQUIS 2 DAYS PRIOR TO YOUR PROCEDURE.  DO NOT TAKE YOUR GLIPIZIDE (GLUCOTROL) ON THE MORNING OF YOUR PROCEDURE.  HOLD  FUROSEMIDE ON THE MORNING OF PROCEDURE.  On the morning of your procedure, take ASPIRIN 81 MG and any morning medicines NOT listed above.  You may use sips of water.  5. Plan for one night stay--bring personal belongings. 6. Bring a current list of your medications and current insurance cards. 7. You MUST have a responsible person to drive you home. 8. Someone MUST be with you the first 24 hours after you arrive home or your discharge will be delayed. 9. Please wear clothes that are easy to get on and off and wear slip-on shoes.   TESTS:  Your physician has requested that you have a lower or upper extremity arterial duplex. This test is an ultrasound of the arteries in the legs or arms. It looks at arterial blood flow in the legs and arms. Allow one hour for Lower and Upper Arterial scans. There are no restrictions or special instructions. TO BE SCHEDULED 1 WEEK AFTER YOUR PROCEDURE  Your physician has requested that you have an ankle brachial index (ABI). During this test an ultrasound and blood pressure cuff are used to evaluate the arteries that supply the arms and legs with blood. Allow thirty minutes for this exam. There are no restrictions or special instructions. TO BE SCHEDULED 1 WEEK AFTER YOUR PROCEDURE  FOLLOW UP: PLEASE FOLLOW UP WITH DR. Gwenlyn Found 2-3 WEEKS AFTER YOUR PROCEDURE.    Thank you for allowing [place] to care for you!   -- [place] Invasive Cardiovascular services

## 2018-07-31 NOTE — Telephone Encounter (Signed)
Pt is currently receiving wound care at [facility]. [name] will cancel pt's appt  today for wound care.Pt has procedure on Monday and appears Dr Gwenlyn Found wants pt to transfer to [facility] wound center .Adonis Housekeeper

## 2018-07-31 NOTE — Assessment & Plan Note (Signed)
[name] returns today after his diagnostic angiogram which I performed 07/27/2018 for critical limb ischemia.  I did place a left SFA stent in him in May 2012 which has remained patent although he does have significant disease beyond the SFA stent and 0 vessel runoff bilaterally.  He had a 99% calcified right above-the-knee popliteal artery stenosis which I did not intervened on because I used 135 cc of contrast and was worried about renal insufficiency.  He was discharged home the same day.  He does complain of some pain in his right foot and has a right great toe wound which occurred after his toenail was removed.  His ABI on that side fell from 0.82 down 0.43 on 07/21/2018.  My plan is to perform diamondback orbital rotational atherectomy, PTA possible stenting of his distal right SFA/popliteal artery to improve collaterals.  He may need tibial pedal access subsequent to that establish inline flow to promote healing.

## 2018-07-31 NOTE — H&P (View-Only) (Signed)
07/31/2018 [name]   March 17, 1927  [id number]  Primary Physician [name], MD Primary Cardiologist: Lorretta Harp MD [name], [place]  HPI:  [name] is a 85 y.o.   accompanied by [name]. He was last seen in the office 07/15/2018.  He has a history of hypertension, hyperlipidemia, obstructive sleep apnea and peripheral vascular disease. I apparently stented his left SFA May 2012. He complained of numbness in his lower extremities specifically his right foot and left leg. His Dopplers performed 08/20/2016 revealed right ABI 0.82 and a left ABI of 0.84. He had palpable pedal pulses on exam.  He had his right toenail removed by Dr. Paulla Dolly 05/07/2018 after he had clipped it himself prior to that and and developed an infection.  Since that time he has had development of gangrene and is required multiple rounds of antibiotics.  He is also had worsening lower extremity edema.    He had Dopplers performed 07/21/2018 revealed an ABI of 0.43 on the right with 0 vessel runoff.  I performed peripheral angiography on him 07/27/2018 via the left femoral approach revealing a patent mid to distal left SFA stent, high-grade calcified disease beyond that and 0 vessel runoff on the left as well as on the right.  Also had a 99% calcified lesion in the distal right SFA/above-knee popliteal artery.  Because I had used 135 cc of contrast I elected not to intervene at that time but this stage intervention for limb salvage.  I am also going to arrange for him to be seen at the [facility] wound care center.   Current Meds  Medication Sig  . apixaban (ELIQUIS) 2.5 MG TABS tablet Take 1 tablet (2.5 mg total) by mouth 2 (two) times daily.  [name] atorvastatin (LIPITOR) 40 MG tablet TAKE 1 TABLET BY MOUTH ONCE DAILY (Patient taking differently: Take 40 mg by mouth daily after supper. )  . calcium carbonate (TUMS - DOSED IN MG ELEMENTAL CALCIUM) 500 MG chewable tablet Chew 1 tablet by mouth  as needed for indigestion or heartburn.  Etta Grandchild DS 500-400 MG tablet Take 1 tablet by mouth 2 (two) times a day.  . diphenhydrAMINE (BENADRYL) 50 MG tablet Please take Prednisone 50mg  by mouth at: Thirteen hours prior to cath: 6:30PM ON 07/26/2018 Seven hours prior to cath: 12:30 AM ON 07/27/2018 And prior to leaving home please take last dose of Prednisone 50mg  and Benadryl 50mg  by mouth.  . docusate sodium (COLACE) 100 MG capsule Take 100 mg by mouth 2 (two) times daily as needed (for use with oxycodone to prevent constipation.).  [name] dorzolamide-timolol (COSOPT) 22.3-6.8 MG/ML ophthalmic solution Place 1 drop into both eyes 2 (two) times daily.  [name] doxazosin (CARDURA) 2 MG tablet Take 1 tablet (2 mg total) by mouth at bedtime.  . finasteride (PROSCAR) 5 MG tablet Take 5 mg by mouth every evening.   . furosemide (LASIX) 40 MG tablet TAKE 1 TABLET(40 MG) BY MOUTH DAILY (Patient taking differently: Take 40 mg by mouth daily. TAKE 1 TABLET(40 MG) BY MOUTH DAILY)  . glipiZIDE (GLUCOTROL) 5 MG tablet Take 5 mg by mouth 2 (two) times daily.   . isosorbide mononitrate (IMDUR) 30 MG 24 hr tablet Take 0.5 tablets (15 mg total) by mouth daily.  [name] levothyroxine (SYNTHROID, LEVOTHROID) 50 MCG tablet Take 50 mcg by mouth daily before breakfast.   . metoprolol tartrate (LOPRESSOR) 25 MG tablet Take 12.5 mg as needed for palpitations/heart racing (Patient  taking differently: Take 12.5 mg by mouth daily as needed (palpitations/heart racing). Take 12.5 mg as needed for palpitations/heart racin)  . NITROGLYCERIN ER PO Take by mouth daily.  . Omega-3 Fatty Acids (FISH OIL PO) Take 1 capsule by mouth daily.  [name] oxyCODONE-acetaminophen (PERCOCET) 5-325 MG tablet Take 1-2 tablets by mouth every 8 (eight) hours as needed for severe pain.  . predniSONE (DELTASONE) 50 MG tablet Please take Prednisone 50mg  by mouth at: Thirteen hours prior to cath: 6:30PM ON 07/26/2018  Seven hours prior to cath: 12:30 AM ON 07/27/2018 And prior  to leaving home please take last dose of Prednisone 50mg  and Benadryl 50mg  by mouth.  . pyridOXINE (VITAMIN B-6) 100 MG tablet Take 100 mg by mouth daily.  . ranitidine (ZANTAC) 150 MG tablet Take 150 mg by mouth at bedtime.  . tamsulosin (FLOMAX) 0.4 MG CAPS Take 0.4 mg by mouth 2 (two) times daily.   . vitamin C (ASCORBIC ACID) 500 MG tablet Take 500 mg by mouth daily.  . vitamin E 400 UNIT capsule Take 400 Units by mouth daily.  . Zinc 50 MG TABS Take 50 mg by mouth every other day.  . [DISCONTINUED] nitroGLYCERIN (NITROLINGUAL) 0.4 MG/SPRAY spray Place 1 spray under the tongue every 5 (five) minutes x 3 doses as needed for chest pain.     Allergies  Allergen Reactions  . Iodinated Diagnostic Agents     Other reaction(s): Fever  . Iodine-131 Other (See Comments)    Breakout, fever  . Iohexol      Code: RASH, Desc: PT STATES HOT ALL OVER HAD TO HAVE ICE PACKS ALL OVER HIM AND DIFF BREATHING, PT REFUSED IV DYE     Social History   Socioeconomic History  . Marital status: Widowed    Spouse name: Not on file  . Number of children: Not on file  . Years of education: Not on file  . Highest education level: Not on file  Occupational History  . Not on file  Social Needs  . Financial resource strain: Not on file  . Food insecurity    Worry: Not on file    Inability: Not on file  . Transportation needs    Medical: Not on file    Non-medical: Not on file  Tobacco Use  . Smoking status: Former Smoker    Quit date: 01/14/1935    Years since quitting: 83.6  . Smokeless tobacco: Former Systems developer    Types: Chew  Substance and Sexual Activity  . Alcohol use: No  . Drug use: No  . Sexual activity: Not on file  Lifestyle  . Physical activity    Days per week: Not on file    Minutes per session: Not on file  . Stress: Not on file  Relationships  . Social Herbalist on phone: Not on file    Gets together: Not on file    Attends religious service: Not on file    Active  member of club or organization: Not on file    Attends meetings of clubs or organizations: Not on file    Relationship status: Not on file  . Intimate partner violence    Fear of current or ex partner: Not on file    Emotionally abused: Not on file    Physically abused: Not on file    Forced sexual activity: Not on file  Other Topics Concern  . Not on file  Social History Narrative  . Not on  file     Review of Systems: General: negative for chills, fever, night sweats or weight changes.  Cardiovascular: negative for chest pain, dyspnea on exertion, edema, orthopnea, palpitations, paroxysmal nocturnal dyspnea or shortness of breath Dermatological: negative for rash Respiratory: negative for cough or wheezing Urologic: negative for hematuria Abdominal: negative for nausea, vomiting, diarrhea, bright red blood per rectum, melena, or hematemesis Neurologic: negative for visual changes, syncope, or dizziness All other systems reviewed and are otherwise negative except as noted above.    Blood pressure (!) 121/54, pulse (!) 53, temperature (!) 96.8 F (36 C), height 5' 11.5" (1.816 m), weight 222 lb 9.6 oz (101 kg), SpO2 98 %.  General appearance: alert and no distress Neck: no adenopathy, no carotid bruit, no JVD, supple, symmetrical, trachea midline and thyroid not enlarged, symmetric, no tenderness/mass/nodules Lungs: clear to auscultation bilaterally Heart: regular rate and rhythm, S1, S2 normal, no murmur, click, rub or gallop Extremities: Right great toe ischemic ulcer Pulses: Absent pedal pulses Skin: Right great toe ischemic ulcer/gangrene Neurologic: Alert and oriented X 3, normal strength and tone. Normal symmetric reflexes. Normal coordination and gait  EKG not performed today  ASSESSMENT AND PLAN:   PAD (peripheral artery disease) [facility]) Mr. Peeples returns today after his diagnostic angiogram which I performed 07/27/2018 for critical limb ischemia.  I did place a left SFA  stent in him in May 2012 which has remained patent although he does have significant disease beyond the SFA stent and 0 vessel runoff bilaterally.  He had a 99% calcified right above-the-knee popliteal artery stenosis which I did not intervened on because I used 135 cc of contrast and was worried about renal insufficiency.  He was discharged home the same day.  He does complain of some pain in his right foot and has a right great toe wound which occurred after his toenail was removed.  His ABI on that side fell from 0.82 down 0.43 on 07/21/2018.  My plan is to perform diamondback orbital rotational atherectomy, PTA possible stenting of his distal right SFA/popliteal artery to improve collaterals.  He may need tibial pedal access subsequent to that establish inline flow to promote healing.      Lorretta Harp MD FACP,FACC,FAHA, [facility] 07/31/2018 11:52 AM

## 2018-07-31 NOTE — Telephone Encounter (Signed)
New Message     [name] is calling to speak with the nurse, she says The pt is scheduled today for wound care and she doesn't want to waste their time if there is no blood flow to his right toe

## 2018-07-31 NOTE — Progress Notes (Signed)
07/31/2018 [name]   08/21/1927  [id number]  Primary Physician [name], MD Primary Cardiologist: Lorretta Harp MD [name], [place]  HPI:  [name] is a 85 y.o.   accompanied by [name]. He was last seen in the office 07/15/2018.  He has a history of hypertension, hyperlipidemia, obstructive sleep apnea and peripheral vascular disease. I apparently stented his left SFA May 2012. He complained of numbness in his lower extremities specifically his right foot and left leg. His Dopplers performed 08/20/2016 revealed right ABI 0.82 and a left ABI of 0.84. He had palpable pedal pulses on exam.  He had his right toenail removed by Dr. Paulla Dolly 05/07/2018 after he had clipped it himself prior to that and and developed an infection.  Since that time he has had development of gangrene and is required multiple rounds of antibiotics.  He is also had worsening lower extremity edema.    He had Dopplers performed 07/21/2018 revealed an ABI of 0.43 on the right with 0 vessel runoff.  I performed peripheral angiography on him 07/27/2018 via the left femoral approach revealing a patent mid to distal left SFA stent, high-grade calcified disease beyond that and 0 vessel runoff on the left as well as on the right.  Also had a 99% calcified lesion in the distal right SFA/above-knee popliteal artery.  Because I had used 135 cc of contrast I elected not to intervene at that time but this stage intervention for limb salvage.  I am also going to arrange for him to be seen at the [facility] wound care center.   Current Meds  Medication Sig  . apixaban (ELIQUIS) 2.5 MG TABS tablet Take 1 tablet (2.5 mg total) by mouth 2 (two) times daily.  [name] atorvastatin (LIPITOR) 40 MG tablet TAKE 1 TABLET BY MOUTH ONCE DAILY (Patient taking differently: Take 40 mg by mouth daily after supper. )  . calcium carbonate (TUMS - DOSED IN MG ELEMENTAL CALCIUM) 500 MG chewable tablet Chew 1 tablet by mouth  as needed for indigestion or heartburn.  Etta Grandchild DS 500-400 MG tablet Take 1 tablet by mouth 2 (two) times a day.  . diphenhydrAMINE (BENADRYL) 50 MG tablet Please take Prednisone 50mg  by mouth at: Thirteen hours prior to cath: 6:30PM ON 07/26/2018 Seven hours prior to cath: 12:30 AM ON 07/27/2018 And prior to leaving home please take last dose of Prednisone 50mg  and Benadryl 50mg  by mouth.  . docusate sodium (COLACE) 100 MG capsule Take 100 mg by mouth 2 (two) times daily as needed (for use with oxycodone to prevent constipation.).  [name] dorzolamide-timolol (COSOPT) 22.3-6.8 MG/ML ophthalmic solution Place 1 drop into both eyes 2 (two) times daily.  [name] doxazosin (CARDURA) 2 MG tablet Take 1 tablet (2 mg total) by mouth at bedtime.  . finasteride (PROSCAR) 5 MG tablet Take 5 mg by mouth every evening.   . furosemide (LASIX) 40 MG tablet TAKE 1 TABLET(40 MG) BY MOUTH DAILY (Patient taking differently: Take 40 mg by mouth daily. TAKE 1 TABLET(40 MG) BY MOUTH DAILY)  . glipiZIDE (GLUCOTROL) 5 MG tablet Take 5 mg by mouth 2 (two) times daily.   . isosorbide mononitrate (IMDUR) 30 MG 24 hr tablet Take 0.5 tablets (15 mg total) by mouth daily.  [name] levothyroxine (SYNTHROID, LEVOTHROID) 50 MCG tablet Take 50 mcg by mouth daily before breakfast.   . metoprolol tartrate (LOPRESSOR) 25 MG tablet Take 12.5 mg as needed for palpitations/heart racing (Patient  taking differently: Take 12.5 mg by mouth daily as needed (palpitations/heart racing). Take 12.5 mg as needed for palpitations/heart racin)  . NITROGLYCERIN ER PO Take by mouth daily.  . Omega-3 Fatty Acids (FISH OIL PO) Take 1 capsule by mouth daily.  [name] oxyCODONE-acetaminophen (PERCOCET) 5-325 MG tablet Take 1-2 tablets by mouth every 8 (eight) hours as needed for severe pain.  . predniSONE (DELTASONE) 50 MG tablet Please take Prednisone 50mg  by mouth at: Thirteen hours prior to cath: 6:30PM ON 07/26/2018  Seven hours prior to cath: 12:30 AM ON 07/27/2018 And prior  to leaving home please take last dose of Prednisone 50mg  and Benadryl 50mg  by mouth.  . pyridOXINE (VITAMIN B-6) 100 MG tablet Take 100 mg by mouth daily.  . ranitidine (ZANTAC) 150 MG tablet Take 150 mg by mouth at bedtime.  . tamsulosin (FLOMAX) 0.4 MG CAPS Take 0.4 mg by mouth 2 (two) times daily.   . vitamin C (ASCORBIC ACID) 500 MG tablet Take 500 mg by mouth daily.  . vitamin E 400 UNIT capsule Take 400 Units by mouth daily.  . Zinc 50 MG TABS Take 50 mg by mouth every other day.  . [DISCONTINUED] nitroGLYCERIN (NITROLINGUAL) 0.4 MG/SPRAY spray Place 1 spray under the tongue every 5 (five) minutes x 3 doses as needed for chest pain.     Allergies  Allergen Reactions  . Iodinated Diagnostic Agents     Other reaction(s): Fever  . Iodine-131 Other (See Comments)    Breakout, fever  . Iohexol      Code: RASH, Desc: PT STATES HOT ALL OVER HAD TO HAVE ICE PACKS ALL OVER HIM AND DIFF BREATHING, PT REFUSED IV DYE     Social History   Socioeconomic History  . Marital status: Widowed    Spouse name: Not on file  . Number of children: Not on file  . Years of education: Not on file  . Highest education level: Not on file  Occupational History  . Not on file  Social Needs  . Financial resource strain: Not on file  . Food insecurity    Worry: Not on file    Inability: Not on file  . Transportation needs    Medical: Not on file    Non-medical: Not on file  Tobacco Use  . Smoking status: Former Smoker    Quit date: 01/14/1935    Years since quitting: 83.6  . Smokeless tobacco: Former Systems developer    Types: Chew  Substance and Sexual Activity  . Alcohol use: No  . Drug use: No  . Sexual activity: Not on file  Lifestyle  . Physical activity    Days per week: Not on file    Minutes per session: Not on file  . Stress: Not on file  Relationships  . Social Herbalist on phone: Not on file    Gets together: Not on file    Attends religious service: Not on file    Active  member of club or organization: Not on file    Attends meetings of clubs or organizations: Not on file    Relationship status: Not on file  . Intimate partner violence    Fear of current or ex partner: Not on file    Emotionally abused: Not on file    Physically abused: Not on file    Forced sexual activity: Not on file  Other Topics Concern  . Not on file  Social History Narrative  . Not on  file     Review of Systems: General: negative for chills, fever, night sweats or weight changes.  Cardiovascular: negative for chest pain, dyspnea on exertion, edema, orthopnea, palpitations, paroxysmal nocturnal dyspnea or shortness of breath Dermatological: negative for rash Respiratory: negative for cough or wheezing Urologic: negative for hematuria Abdominal: negative for nausea, vomiting, diarrhea, bright red blood per rectum, melena, or hematemesis Neurologic: negative for visual changes, syncope, or dizziness All other systems reviewed and are otherwise negative except as noted above.    Blood pressure (!) 121/54, pulse (!) 53, temperature (!) 96.8 F (36 C), height 5' 11.5" (1.816 m), weight 222 lb 9.6 oz (101 kg), SpO2 98 %.  General appearance: alert and no distress Neck: no adenopathy, no carotid bruit, no JVD, supple, symmetrical, trachea midline and thyroid not enlarged, symmetric, no tenderness/mass/nodules Lungs: clear to auscultation bilaterally Heart: regular rate and rhythm, S1, S2 normal, no murmur, click, rub or gallop Extremities: Right great toe ischemic ulcer Pulses: Absent pedal pulses Skin: Right great toe ischemic ulcer/gangrene Neurologic: Alert and oriented X 3, normal strength and tone. Normal symmetric reflexes. Normal coordination and gait  EKG not performed today  ASSESSMENT AND PLAN:   PAD (peripheral artery disease) [name]) Mr. Renz returns today after his diagnostic angiogram which I performed 07/27/2018 for critical limb ischemia.  I did place a left SFA  stent in him in May 2012 which has remained patent although he does have significant disease beyond the SFA stent and 0 vessel runoff bilaterally.  He had a 99% calcified right above-the-knee popliteal artery stenosis which I did not intervened on because I used 135 cc of contrast and was worried about renal insufficiency.  He was discharged home the same day.  He does complain of some pain in his right foot and has a right great toe wound which occurred after his toenail was removed.  His ABI on that side fell from 0.82 down 0.43 on 07/21/2018.  My plan is to perform diamondback orbital rotational atherectomy, PTA possible stenting of his distal right SFA/popliteal artery to improve collaterals.  He may need tibial pedal access subsequent to that establish inline flow to promote healing.      Lorretta Harp MD FACP,FACC,FAHA, [facility] 07/31/2018 11:52 AM

## 2018-07-31 NOTE — Addendum Note (Signed)
Addended by: Annita Brod on: 07/31/2018 01:27 PM   Modules accepted: Orders

## 2018-07-31 NOTE — Telephone Encounter (Signed)
I called [name] daughter, [name], and discussed his visit with Dr. Gwenlyn Found today. She was unsure about Dr. Kennon Holter recommendation for him coming to wound care today as his circulation impairments have not been corrected. I called Dr. Kennon Holter office and discussed this with his nurse, [name], as well. She stated Dr. Gwenlyn Found agreed that we can hold off on wound care until [name] has had his procedure this coming Monday. Dr. Kennon Holter office has also made plans for the patient to continue wound care at [facility] and I relayed this to the patient's daughter. She is planning to discuss this with Dr. Gwenlyn Found and discuss the long term plan for his great toe management. She has expressed that coming to wound care in [place] is more convenient but I did discuss with her they may offer additional treatment methods at [facility] or have an MD present in the office. She understood this and will discuss the plan after her father has had the procedure.   [name], PT, DPT, [facility] Physical Therapist with [facility]  07/31/2018 3:06 PM  '

## 2018-08-03 ENCOUNTER — Telehealth: Payer: Self-pay | Admitting: *Deleted

## 2018-08-03 ENCOUNTER — Ambulatory Visit (HOSPITAL_COMMUNITY)
Admission: RE | Admit: 2018-08-03 | Discharge: 2018-08-04 | Disposition: A | Discharge status: Home / Self Care | Payer: Medicare Other | Attending: Cardiovascular Disease | Admitting: Cardiovascular Disease

## 2018-08-03 ENCOUNTER — Ambulatory Visit (HOSPITAL_COMMUNITY): Payer: Medicare Other

## 2018-08-03 ENCOUNTER — Other Ambulatory Visit: Payer: Self-pay

## 2018-08-03 ENCOUNTER — Encounter (HOSPITAL_COMMUNITY)
Admission: RE | Disposition: A | Discharge status: Home / Self Care | Payer: Medicare Other | Source: Home / Self Care | Attending: Cardiovascular Disease

## 2018-08-03 DIAGNOSIS — N183 Chronic kidney disease, stage 3 (moderate): Secondary | ICD-10-CM | POA: Diagnosis not present

## 2018-08-03 DIAGNOSIS — I70261 Atherosclerosis of native arteries of extremities with gangrene, right leg: Secondary | ICD-10-CM | POA: Diagnosis not present

## 2018-08-03 DIAGNOSIS — Z87891 Personal history of nicotine dependence: Secondary | ICD-10-CM | POA: Insufficient documentation

## 2018-08-03 DIAGNOSIS — Z7901 Long term (current) use of anticoagulants: Secondary | ICD-10-CM | POA: Diagnosis not present

## 2018-08-03 DIAGNOSIS — I998 Other disorder of circulatory system: Secondary | ICD-10-CM | POA: Insufficient documentation

## 2018-08-03 DIAGNOSIS — I771 Stricture of artery: Secondary | ICD-10-CM | POA: Diagnosis not present

## 2018-08-03 DIAGNOSIS — I130 Hypertensive heart and chronic kidney disease with heart failure and stage 1 through stage 4 chronic kidney disease, or unspecified chronic kidney disease: Secondary | ICD-10-CM | POA: Diagnosis not present

## 2018-08-03 DIAGNOSIS — L97519 Non-pressure chronic ulcer of other part of right foot with unspecified severity: Secondary | ICD-10-CM | POA: Diagnosis not present

## 2018-08-03 DIAGNOSIS — I70202 Unspecified atherosclerosis of native arteries of extremities, left leg: Secondary | ICD-10-CM | POA: Diagnosis not present

## 2018-08-03 DIAGNOSIS — E785 Hyperlipidemia, unspecified: Secondary | ICD-10-CM | POA: Insufficient documentation

## 2018-08-03 DIAGNOSIS — Z79890 Hormone replacement therapy (postmenopausal): Secondary | ICD-10-CM | POA: Diagnosis not present

## 2018-08-03 DIAGNOSIS — E1122 Type 2 diabetes mellitus with diabetic chronic kidney disease: Secondary | ICD-10-CM | POA: Diagnosis not present

## 2018-08-03 DIAGNOSIS — Z79899 Other long term (current) drug therapy: Secondary | ICD-10-CM | POA: Diagnosis not present

## 2018-08-03 DIAGNOSIS — I5032 Chronic diastolic (congestive) heart failure: Secondary | ICD-10-CM | POA: Diagnosis present

## 2018-08-03 DIAGNOSIS — Z91041 Radiographic dye allergy status: Secondary | ICD-10-CM | POA: Diagnosis not present

## 2018-08-03 DIAGNOSIS — I350 Nonrheumatic aortic (valve) stenosis: Secondary | ICD-10-CM

## 2018-08-03 DIAGNOSIS — I7092 Chronic total occlusion of artery of the extremities: Secondary | ICD-10-CM | POA: Diagnosis not present

## 2018-08-03 DIAGNOSIS — Z7984 Long term (current) use of oral hypoglycemic drugs: Secondary | ICD-10-CM | POA: Diagnosis not present

## 2018-08-03 DIAGNOSIS — E1152 Type 2 diabetes mellitus with diabetic peripheral angiopathy with gangrene: Secondary | ICD-10-CM | POA: Insufficient documentation

## 2018-08-03 DIAGNOSIS — G4733 Obstructive sleep apnea (adult) (pediatric): Secondary | ICD-10-CM | POA: Diagnosis not present

## 2018-08-03 DIAGNOSIS — I480 Paroxysmal atrial fibrillation: Secondary | ICD-10-CM | POA: Diagnosis present

## 2018-08-03 DIAGNOSIS — E11621 Type 2 diabetes mellitus with foot ulcer: Secondary | ICD-10-CM | POA: Diagnosis not present

## 2018-08-03 DIAGNOSIS — D72829 Elevated white blood cell count, unspecified: Secondary | ICD-10-CM | POA: Insufficient documentation

## 2018-08-03 DIAGNOSIS — I70229 Atherosclerosis of native arteries of extremities with rest pain, unspecified extremity: Secondary | ICD-10-CM | POA: Diagnosis present

## 2018-08-03 DIAGNOSIS — I739 Peripheral vascular disease, unspecified: Secondary | ICD-10-CM

## 2018-08-03 DIAGNOSIS — I10 Essential (primary) hypertension: Secondary | ICD-10-CM | POA: Diagnosis present

## 2018-08-03 HISTORY — PX: PERIPHERAL VASCULAR ATHERECTOMY: CATH118256

## 2018-08-03 LAB — POCT ACTIVATED CLOTTING TIME
Activated Clotting Time: 241 seconds
Activated Clotting Time: 290 seconds
Activated Clotting Time: 208 seconds
Activated Clotting Time: 186 seconds

## 2018-08-03 LAB — CBC
HCT: 32.8 % — ABNORMAL LOW (ref 39.0–52.0)
Hemoglobin: 10.8 g/dL — ABNORMAL LOW (ref 13.0–17.0)
MCH: 32 pg (ref 26.0–34.0)
MCHC: 32.9 g/dL (ref 30.0–36.0)
MCV: 97.3 fL (ref 80.0–100.0)
Platelets: 344 10*3/uL (ref 150–400)
RBC: 3.37 MIL/uL — ABNORMAL LOW (ref 4.22–5.81)
RDW: 13.4 % (ref 11.5–15.5)
WBC: 14.9 10*3/uL — ABNORMAL HIGH (ref 4.0–10.5)
nRBC: 0 % (ref 0.0–0.2)

## 2018-08-03 LAB — BASIC METABOLIC PANEL
Anion gap: 11 (ref 5–15)
BUN: 17 mg/dL (ref 8–23)
CO2: 22 mmol/L (ref 22–32)
Calcium: 8.6 mg/dL — ABNORMAL LOW (ref 8.9–10.3)
Chloride: 97 mmol/L — ABNORMAL LOW (ref 98–111)
Creatinine, Ser: 1.24 mg/dL (ref 0.61–1.24)
GFR calc Af Amer: 60 mL/min — ABNORMAL LOW (ref >60)
GFR calc non Af Amer: 52 mL/min — ABNORMAL LOW (ref >60)
Glucose, Bld: 268 mg/dL — ABNORMAL HIGH (ref 70–99)
Potassium: 4.8 mmol/L (ref 3.5–5.1)
Sodium: 130 mmol/L — ABNORMAL LOW (ref 135–145)

## 2018-08-03 LAB — GLUCOSE, CAPILLARY
Glucose-Capillary: 270 mg/dL — ABNORMAL HIGH (ref 70–99)
Glucose-Capillary: 119 mg/dL — ABNORMAL HIGH (ref 70–99)
Glucose-Capillary: 330 mg/dL — ABNORMAL HIGH (ref 70–99)

## 2018-08-03 SURGERY — PERIPHERAL VASCULAR ATHERECTOMY
Anesthesia: LOCAL | Laterality: Right

## 2018-08-03 MED ORDER — SODIUM CHLORIDE 0.9 % IV SOLN: 250.0000 mL | INTRAVENOUS | Status: DC | PRN

## 2018-08-03 MED ORDER — FENTANYL CITRATE (PF) 100 MCG/2ML IJ SOLN
INTRAMUSCULAR | Status: DC | PRN
  Administered 2018-08-03 (×4): 25 ug via INTRAVENOUS

## 2018-08-03 MED ORDER — HYDRALAZINE HCL 20 MG/ML IJ SOLN
5.0000 mg | INTRAMUSCULAR | Status: DC | PRN
  Administered 2018-08-03: 5 mg via INTRAVENOUS

## 2018-08-03 MED ORDER — GLIPIZIDE 10 MG PO TABS
5.0000 mg | ORAL_TABLET | Freq: Two times a day (BID) | ORAL | Status: DC
  Administered 2018-08-03 – 2018-08-04 (×2): 5 mg via ORAL
  Filled 2018-08-03 (×2): qty 1

## 2018-08-03 MED ORDER — SODIUM CHLORIDE 0.9 % IV SOLN
INTRAVENOUS | Status: AC
  Administered 2018-08-03: 17:00:00 via INTRAVENOUS

## 2018-08-03 MED ORDER — SODIUM CHLORIDE 0.9% FLUSH: 3.0000 mL | INTRAVENOUS | Status: DC | PRN

## 2018-08-03 MED ORDER — ACETAMINOPHEN 325 MG PO TABS: 650.0000 mg | ORAL_TABLET | ORAL | Status: DC | PRN

## 2018-08-03 MED ORDER — MIDAZOLAM HCL 2 MG/2ML IJ SOLN
INTRAMUSCULAR | Status: DC | PRN
  Administered 2018-08-03 (×2): 1 mg via INTRAVENOUS

## 2018-08-03 MED ORDER — SODIUM CHLORIDE 0.9 % WEIGHT BASED INFUSION
3.0000 mL/kg/h | INTRAVENOUS | Status: DC
  Administered 2018-08-03: 3 mL/kg/h via INTRAVENOUS

## 2018-08-03 MED ORDER — DOXAZOSIN MESYLATE 2 MG PO TABS
2.0000 mg | ORAL_TABLET | Freq: Every day | ORAL | Status: DC
  Administered 2018-08-03: 2 mg via ORAL
  Filled 2018-08-03: qty 1

## 2018-08-03 MED ORDER — HEPARIN SODIUM (PORCINE) 1000 UNIT/ML IJ SOLN
INTRAMUSCULAR | Status: AC
  Filled 2018-08-03: qty 1

## 2018-08-03 MED ORDER — METOPROLOL TARTRATE 12.5 MG HALF TABLET: 12.5000 mg | ORAL_TABLET | Freq: Every day | ORAL | Status: DC | PRN

## 2018-08-03 MED ORDER — SODIUM CHLORIDE 0.9% FLUSH: 3.0000 mL | Freq: Two times a day (BID) | INTRAVENOUS | Status: DC

## 2018-08-03 MED ORDER — HEPARIN (PORCINE) IN NACL 1000-0.9 UT/500ML-% IV SOLN
INTRAVENOUS | Status: AC
  Filled 2018-08-03: qty 1000

## 2018-08-03 MED ORDER — SODIUM CHLORIDE 0.9% FLUSH
3.0000 mL | Freq: Two times a day (BID) | INTRAVENOUS | Status: DC
  Administered 2018-08-04: 3 mL via INTRAVENOUS

## 2018-08-03 MED ORDER — TAMSULOSIN HCL 0.4 MG PO CAPS
0.4000 mg | ORAL_CAPSULE | Freq: Two times a day (BID) | ORAL | Status: DC
  Administered 2018-08-03 – 2018-08-04 (×2): 0.4 mg via ORAL
  Filled 2018-08-03 (×2): qty 1

## 2018-08-03 MED ORDER — HEPARIN (PORCINE) IN NACL 1000-0.9 UT/500ML-% IV SOLN
INTRAVENOUS | Status: DC | PRN
  Administered 2018-08-03 (×2): 500 mL

## 2018-08-03 MED ORDER — CLOPIDOGREL BISULFATE 75 MG PO TABS
75.0000 mg | ORAL_TABLET | Freq: Every day | ORAL | Status: DC
  Administered 2018-08-04: 75 mg via ORAL
  Filled 2018-08-03: qty 1

## 2018-08-03 MED ORDER — LABETALOL HCL 5 MG/ML IV SOLN
INTRAVENOUS | Status: AC
  Filled 2018-08-03: qty 4

## 2018-08-03 MED ORDER — FUROSEMIDE 40 MG PO TABS
40.0000 mg | ORAL_TABLET | Freq: Every day | ORAL | Status: DC
  Administered 2018-08-03 – 2018-08-04 (×2): 40 mg via ORAL
  Filled 2018-08-03 (×2): qty 1

## 2018-08-03 MED ORDER — LIDOCAINE HCL (PF) 1 % IJ SOLN
INTRAMUSCULAR | Status: DC | PRN
  Administered 2018-08-03: 15 mL via INTRADERMAL

## 2018-08-03 MED ORDER — LIDOCAINE HCL (PF) 1 % IJ SOLN
INTRAMUSCULAR | Status: AC
  Filled 2018-08-03: qty 30

## 2018-08-03 MED ORDER — MIDAZOLAM HCL 2 MG/2ML IJ SOLN
INTRAMUSCULAR | Status: AC
  Filled 2018-08-03: qty 2

## 2018-08-03 MED ORDER — ATORVASTATIN CALCIUM 40 MG PO TABS
40.0000 mg | ORAL_TABLET | Freq: Every day | ORAL | Status: DC
  Administered 2018-08-03: 40 mg via ORAL
  Filled 2018-08-03: qty 1

## 2018-08-03 MED ORDER — FENTANYL CITRATE (PF) 100 MCG/2ML IJ SOLN
INTRAMUSCULAR | Status: AC
  Filled 2018-08-03: qty 2

## 2018-08-03 MED ORDER — LEVOTHYROXINE SODIUM 50 MCG PO TABS
50.0000 ug | ORAL_TABLET | Freq: Every day | ORAL | Status: DC
  Administered 2018-08-04: 50 ug via ORAL
  Filled 2018-08-03: qty 1

## 2018-08-03 MED ORDER — IODIXANOL 320 MG/ML IV SOLN
INTRAVENOUS | Status: DC | PRN
  Administered 2018-08-03: 130 mL via INTRA_ARTERIAL

## 2018-08-03 MED ORDER — HEPARIN SODIUM (PORCINE) 1000 UNIT/ML IJ SOLN
INTRAMUSCULAR | Status: DC | PRN
  Administered 2018-08-03: 10000 [IU] via INTRAVENOUS
  Administered 2018-08-03: 3000 [IU] via INTRAVENOUS

## 2018-08-03 MED ORDER — MORPHINE SULFATE (PF) 2 MG/ML IV SOLN
INTRAVENOUS | Status: AC
  Filled 2018-08-03: qty 1

## 2018-08-03 MED ORDER — ALUM & MAG HYDROXIDE-SIMETH 200-200-20 MG/5ML PO SUSP
30.0000 mL | ORAL | Status: DC | PRN
  Administered 2018-08-03: 30 mL via ORAL
  Filled 2018-08-03: qty 30

## 2018-08-03 MED ORDER — ASPIRIN EC 81 MG PO TBEC
81.0000 mg | DELAYED_RELEASE_TABLET | Freq: Every day | ORAL | Status: DC
  Administered 2018-08-04: 81 mg via ORAL
  Filled 2018-08-03 (×2): qty 1

## 2018-08-03 MED ORDER — LABETALOL HCL 5 MG/ML IV SOLN: 10.0000 mg | INTRAVENOUS | Status: DC | PRN

## 2018-08-03 MED ORDER — MORPHINE SULFATE (PF) 10 MG/ML IV SOLN: 2.0000 mg | INTRAVENOUS | Status: DC | PRN

## 2018-08-03 MED ORDER — ASPIRIN 81 MG PO CHEW: 81.0000 mg | CHEWABLE_TABLET | ORAL | Status: DC

## 2018-08-03 MED ORDER — HYDRALAZINE HCL 20 MG/ML IJ SOLN
INTRAMUSCULAR | Status: AC
  Filled 2018-08-03: qty 1

## 2018-08-03 MED ORDER — VERAPAMIL HCL 2.5 MG/ML IV SOLN
INTRAVENOUS | Status: AC
  Filled 2018-08-03: qty 2

## 2018-08-03 MED ORDER — CLOPIDOGREL BISULFATE 300 MG PO TABS
ORAL_TABLET | ORAL | Status: DC | PRN
  Administered 2018-08-03: 300 mg via ORAL

## 2018-08-03 MED ORDER — NITROGLYCERIN 1 MG/10 ML FOR IR/CATH LAB
INTRA_ARTERIAL | Status: DC | PRN
  Administered 2018-08-03 (×2): 200 ug via INTRA_ARTERIAL
  Administered 2018-08-03: 400 ug via INTRA_ARTERIAL
  Administered 2018-08-03 (×2): 200 ug via INTRA_ARTERIAL

## 2018-08-03 MED ORDER — FINASTERIDE 5 MG PO TABS
5.0000 mg | ORAL_TABLET | Freq: Every evening | ORAL | Status: DC
  Administered 2018-08-03: 5 mg via ORAL
  Filled 2018-08-03: qty 1

## 2018-08-03 MED ORDER — NITROGLYCERIN IN D5W 200-5 MCG/ML-% IV SOLN
INTRAVENOUS | Status: AC
  Filled 2018-08-03: qty 250

## 2018-08-03 MED ORDER — ONDANSETRON HCL 4 MG/2ML IJ SOLN
4.0000 mg | Freq: Four times a day (QID) | INTRAMUSCULAR | Status: DC | PRN
  Administered 2018-08-03: 4 mg via INTRAVENOUS
  Filled 2018-08-03: qty 2

## 2018-08-03 MED ORDER — VIPERSLIDE LUBRICANT OPTIME
TOPICAL | Status: DC | PRN
  Administered 2018-08-03: 20 mL via SURGICAL_CAVITY

## 2018-08-03 MED ORDER — VERAPAMIL HCL 2.5 MG/ML IV SOLN
INTRAVENOUS | Status: DC | PRN
  Administered 2018-08-03: 400 ug via INTRA_ARTERIAL

## 2018-08-03 MED ORDER — SODIUM CHLORIDE 0.9 % WEIGHT BASED INFUSION: 1.0000 mL/kg/h | INTRAVENOUS | Status: DC

## 2018-08-03 MED ORDER — CLOPIDOGREL BISULFATE 300 MG PO TABS
ORAL_TABLET | ORAL | Status: AC
  Filled 2018-08-03: qty 1

## 2018-08-03 MED ORDER — MORPHINE SULFATE (PF) 2 MG/ML IV SOLN
2.0000 mg | INTRAVENOUS | Status: DC | PRN
  Administered 2018-08-03: 2 mg via INTRAVENOUS
  Filled 2018-08-03: qty 1

## 2018-08-03 SURGICAL SUPPLY — 31 items
BALLN CHOCOLATE 4.0X120X135 (BALLOONS) ×2
BALLN IN.PACT DCB 5X150 (BALLOONS) ×2
BALLOON CHOCOLATE 4.0X120X135 (BALLOONS) IMPLANT
BUR DBK EXCH CART 2.00 SOL 145 (BURR) IMPLANT
BURRDBK EXCH CART 2.00 SOL 145 (BURR) ×2
CATH ANGIO 5F PIGTAIL 65CM (CATHETERS) ×1 IMPLANT
CATH CROSS OVER TEMPO 5F (CATHETERS) ×1 IMPLANT
CATH CXI 2.3F 150 ST (CATHETERS) ×1 IMPLANT
CATH TELEPORT (CATHETERS) ×1 IMPLANT
CATH TEMPO AQUA 5F 100CM (CATHETERS) ×1 IMPLANT
CROWN 1.25 MICRO 145 DIAMONDBK (BURR) ×1 IMPLANT
DCB IN.PACT 5X150 (BALLOONS) IMPLANT
DEVICE CONTINUOUS FLUSH (MISCELLANEOUS) ×1 IMPLANT
GUIDEWIRE REGALIA .014X300CM (WIRE) ×1 IMPLANT
KIT ENCORE 26 ADVANTAGE (KITS) ×1 IMPLANT
KIT PV (KITS) ×2 IMPLANT
LUBRICANT VIPERSLIDE CORONARY (MISCELLANEOUS) ×1 IMPLANT
SHEATH HIGHFLEX ANSEL 7FR 55CM (SHEATH) ×1 IMPLANT
SHEATH PINNACLE 5F 10CM (SHEATH) ×1 IMPLANT
SHEATH PINNACLE 7F 10CM (SHEATH) ×1 IMPLANT
STOPCOCK MORSE 400PSI 3WAY (MISCELLANEOUS) ×1 IMPLANT
SYR MEDRAD MARK 7 150ML (SYRINGE) ×2 IMPLANT
TAPE VIPERTRACK RADIOPAQ (MISCELLANEOUS) IMPLANT
TAPE VIPERTRACK RADIOPAQUE (MISCELLANEOUS) ×1
TRANSDUCER W/STOPCOCK (MISCELLANEOUS) ×2 IMPLANT
TRAY PV CATH (CUSTOM PROCEDURE TRAY) ×2 IMPLANT
TUBING CIL FLEX 10 FLL-RA (TUBING) ×1 IMPLANT
WIRE HITORQ VERSACORE ST 145CM (WIRE) ×1 IMPLANT
WIRE ROSEN-J .035X260CM (WIRE) ×1 IMPLANT
WIRE SPARTACORE .014X300CM (WIRE) ×1 IMPLANT
WIRE VIPER WIRECTO 0.014 (WIRE) ×1 IMPLANT

## 2018-08-03 NOTE — Telephone Encounter (Signed)
Spoke with [place] regarding appointment with the Wound Care Center---scheduled for Tuesday 08/11/18 at 2:45pm--She was also given location and phone number [phone]--located in [place] beside [facility]  [name]

## 2018-08-03 NOTE — Interval H&P Note (Signed)
History and Physical Interval Note:  08/03/2018 10:00 AM  [name]  has presented today for surgery, with the diagnosis of PAD.  The various methods of treatment have been discussed with the patient and family. After consideration of risks, benefits and other options for treatment, the patient has consented to  Procedure(s): PERIPHERAL VASCULAR ATHERECTOMY (Right) as a surgical intervention.  The patient's history has been reviewed, patient examined, no change in status, stable for surgery.  I have reviewed the patient's chart and labs.  Questions were answered to the patient's satisfaction.     [name]

## 2018-08-03 NOTE — Progress Notes (Signed)
Site area: Left groin a 7 french arterial sheath was removed.  Site Prior to Removal:  Level 1- bruise from previous intervention  Pressure Applied For 20 MINUTES    Bedrest Beginning at  1500p  Manual:   Yes.    Patient Status During Pull:  stable  Post Pull Groin Site:  Level 0  Post Pull Instructions Given:  Yes.    Post Pull Pulses Present:  Yes.    Dressing Applied:  Yes.    Comments:

## 2018-08-04 ENCOUNTER — Encounter (HOSPITAL_COMMUNITY): Payer: Medicare Other

## 2018-08-04 ENCOUNTER — Encounter (HOSPITAL_COMMUNITY): Payer: Self-pay | Admitting: Cardiovascular Disease

## 2018-08-04 DIAGNOSIS — I130 Hypertensive heart and chronic kidney disease with heart failure and stage 1 through stage 4 chronic kidney disease, or unspecified chronic kidney disease: Secondary | ICD-10-CM | POA: Diagnosis not present

## 2018-08-04 DIAGNOSIS — I998 Other disorder of circulatory system: Secondary | ICD-10-CM

## 2018-08-04 DIAGNOSIS — N183 Chronic kidney disease, stage 3 (moderate): Secondary | ICD-10-CM | POA: Diagnosis not present

## 2018-08-04 DIAGNOSIS — L97519 Non-pressure chronic ulcer of other part of right foot with unspecified severity: Secondary | ICD-10-CM | POA: Diagnosis not present

## 2018-08-04 DIAGNOSIS — Z87891 Personal history of nicotine dependence: Secondary | ICD-10-CM | POA: Diagnosis not present

## 2018-08-04 DIAGNOSIS — Z7984 Long term (current) use of oral hypoglycemic drugs: Secondary | ICD-10-CM | POA: Diagnosis not present

## 2018-08-04 DIAGNOSIS — E785 Hyperlipidemia, unspecified: Secondary | ICD-10-CM | POA: Diagnosis not present

## 2018-08-04 DIAGNOSIS — E11621 Type 2 diabetes mellitus with foot ulcer: Secondary | ICD-10-CM | POA: Diagnosis not present

## 2018-08-04 DIAGNOSIS — Z79899 Other long term (current) drug therapy: Secondary | ICD-10-CM | POA: Diagnosis not present

## 2018-08-04 DIAGNOSIS — Z91041 Radiographic dye allergy status: Secondary | ICD-10-CM | POA: Diagnosis not present

## 2018-08-04 DIAGNOSIS — I350 Nonrheumatic aortic (valve) stenosis: Secondary | ICD-10-CM | POA: Diagnosis not present

## 2018-08-04 DIAGNOSIS — G4733 Obstructive sleep apnea (adult) (pediatric): Secondary | ICD-10-CM | POA: Diagnosis not present

## 2018-08-04 DIAGNOSIS — I5032 Chronic diastolic (congestive) heart failure: Secondary | ICD-10-CM | POA: Diagnosis not present

## 2018-08-04 DIAGNOSIS — I771 Stricture of artery: Secondary | ICD-10-CM | POA: Diagnosis not present

## 2018-08-04 DIAGNOSIS — Z7901 Long term (current) use of anticoagulants: Secondary | ICD-10-CM | POA: Diagnosis not present

## 2018-08-04 DIAGNOSIS — E1122 Type 2 diabetes mellitus with diabetic chronic kidney disease: Secondary | ICD-10-CM | POA: Diagnosis not present

## 2018-08-04 DIAGNOSIS — E1152 Type 2 diabetes mellitus with diabetic peripheral angiopathy with gangrene: Secondary | ICD-10-CM | POA: Diagnosis not present

## 2018-08-04 DIAGNOSIS — I70261 Atherosclerosis of native arteries of extremities with gangrene, right leg: Secondary | ICD-10-CM | POA: Diagnosis not present

## 2018-08-04 DIAGNOSIS — I480 Paroxysmal atrial fibrillation: Secondary | ICD-10-CM | POA: Diagnosis not present

## 2018-08-04 DIAGNOSIS — D72829 Elevated white blood cell count, unspecified: Secondary | ICD-10-CM | POA: Diagnosis not present

## 2018-08-04 LAB — BASIC METABOLIC PANEL
Anion gap: 10 (ref 5–15)
BUN: 19 mg/dL (ref 8–23)
CO2: 22 mmol/L (ref 22–32)
Calcium: 8.6 mg/dL — ABNORMAL LOW (ref 8.9–10.3)
Chloride: 100 mmol/L (ref 98–111)
Creatinine, Ser: 1.19 mg/dL (ref 0.61–1.24)
GFR calc Af Amer: >60 mL/min (ref >60)
GFR calc non Af Amer: 54 mL/min — ABNORMAL LOW (ref >60)
Glucose, Bld: 234 mg/dL — ABNORMAL HIGH (ref 70–99)
Potassium: 4 mmol/L (ref 3.5–5.1)
Sodium: 132 mmol/L — ABNORMAL LOW (ref 135–145)

## 2018-08-04 LAB — CBC
HCT: 29.5 % — ABNORMAL LOW (ref 39.0–52.0)
Hemoglobin: 10 g/dL — ABNORMAL LOW (ref 13.0–17.0)
MCH: 31.8 pg (ref 26.0–34.0)
MCHC: 33.9 g/dL (ref 30.0–36.0)
MCV: 93.9 fL (ref 80.0–100.0)
Platelets: 294 10*3/uL (ref 150–400)
RBC: 3.14 MIL/uL — ABNORMAL LOW (ref 4.22–5.81)
RDW: 13.4 % (ref 11.5–15.5)
WBC: 19.5 10*3/uL — ABNORMAL HIGH (ref 4.0–10.5)
nRBC: 0 % (ref 0.0–0.2)

## 2018-08-04 MED ORDER — ASPIRIN 81 MG PO TBEC: 81.0000 mg | DELAYED_RELEASE_TABLET | Freq: Every day | ORAL | 0 refills | Status: DC

## 2018-08-04 MED ORDER — DOXAZOSIN MESYLATE 2 MG PO TABS
2.0000 mg | ORAL_TABLET | Freq: Every day | ORAL | Status: DC
  Filled 2018-08-04: qty 1

## 2018-08-04 MED ORDER — DOXAZOSIN MESYLATE 4 MG PO TABS: 4.0000 mg | ORAL_TABLET | Freq: Every day | ORAL | Status: DC

## 2018-08-04 MED ORDER — AMLODIPINE BESYLATE 2.5 MG PO TABS: 2.5000 mg | ORAL_TABLET | Freq: Every day | ORAL | Status: DC

## 2018-08-04 MED ORDER — CLOPIDOGREL BISULFATE 75 MG PO TABS: 75.0000 mg | ORAL_TABLET | Freq: Every day | ORAL | 3 refills | Status: DC

## 2018-08-04 MED FILL — ASPIRIN LOW DOSE 81 MG TBEC: 81 | 30 days supply | Qty: 30 | Fill #0

## 2018-08-04 MED FILL — CLOPIDOGREL 75 MG TABLET: 75 | 90 days supply | Qty: 90 | Fill #0

## 2018-08-04 NOTE — Discharge Instructions (Signed)
PLEASE REMEMBER TO BRING ALL OF YOUR MEDICATIONS TO EACH OF YOUR FOLLOW-UP OFFICE VISITS.  PLEASE ATTEND ALL SCHEDULED FOLLOW-UP APPOINTMENTS.   Activity: Increase activity slowly as tolerated. You may shower, but no soaking baths (or swimming) for 1 week. No driving for 24 hours. No lifting over 5 lbs for 1 week. No sexual activity for 1 week.   You May Return to Work: in 1 week (if applicable)  Wound Care: You may wash cath site gently with soap and water. Keep cath site clean and dry. If you notice pain, swelling, bleeding or pus at your cath site, please call [phone].    You have been started on plavix 75mg  daily to help keep your stent open. You have also been recommended to take aspirin for the next month, with plans to stop this medication on 09/04/2018 in order to minimize your bleeding risk given the need for ongoing apixaban (Eliquis) use.

## 2018-08-04 NOTE — Discharge Summary (Signed)
Discharge Summary    Patient ID: [name],  MRN: [id number], DOB/AGE: Jun 07, 1927 85 y.o.  Admit date: 08/03/2018 Discharge date: 08/04/2018  Primary Care Provider: [name] Primary Cardiologist: Pixie Casino, MD  Discharge Diagnoses    Principal Problem:   Critical lower limb ischemia Active Problems:   PAD (peripheral artery disease) (HCC)   Essential hypertension   Aortic valve stenosis   Chronic diastolic CHF (congestive heart failure) (HCC)   PAF (paroxysmal atrial fibrillation) (HCC)   Allergies Allergies  Allergen Reactions   Iodinated Diagnostic Agents     Other reaction(s): Fever   Iodine-131 Other (See Comments)    Breakout, fever   Iohexol      Code: RASH, Desc: PT STATES HOT ALL OVER HAD TO HAVE ICE PACKS ALL OVER HIM AND DIFF BREATHING, PT REFUSED IV DYE     Diagnostic Studies/Procedures    Peripheral vascular atherectomy 08/03/2018: Final Impression: Successful diamondback orbital rotational atherectomy, chocolate balloon angioplasty followed by drug-coated balloon angioplasty of a highly calcified segmental distal right SFA, above the knee popliteal artery CTO in the setting of critical limb ischemia with an occluded distal popliteal and 0 vessel runoff.  There was very sluggish flow suggesting potential for "distal embolization.  Vasodilators were administered.  The Ansell sheath was exchanged over the Margaret wire for a short 7 [place] sheath which was then secured in place.  The patient received 300 mg of p.o. Plavix.  He will be hydrated overnight and discharged home in the morning.  His Eliquis will be restarted in addition to aspirin and Plavix for 1 month after which aspirin will be discontinued.  We will get lower extremity arterial Doppler studies in our [facility] line office next week and I will see him back the week after.  He will need to be seen by the [facility] wound care center for aggressive local wound care of his right great toe  gangrenous lesion.  He left lab in stable condition. _____________   History of Present Illness     [name] is a 85 y.o.  accompanied by [name]. He was seen in the office 07/15/2018.He has a history of hypertension, hyperlipidemia, obstructive sleep apnea and peripheral vascular disease. He underwent stenting his left SFA May 2012. He complainedof numbness in his lower extremities specifically his right foot and left leg. His Dopplers performed 8/7/2018revealed right ABI 0.82 and a left ABI of 0.84. He hadpalpable pedal pulses on exam.  He had his right toenail removed by Dr. Paulla Dolly 05/07/2018 after he had clipped it himself prior to that and and developed an infection. Since that time he has had development of gangrene and has required multiple rounds of antibiotics. He has also had worsening lower extremity edema. He had Dopplers performed 07/21/2018 revealed an ABI of 0.43 on the right with 0 vessel runoff.  He underwent peripheral angiography on 07/27/2018 via the left femoral approach revealing a patent mid to distal left SFA stent, high-grade calcified disease beyond that and 0 vessel runoff on the left as well as on the right.  Also had a 99% calcified lesion in the distal right SFA/above-knee popliteal artery.  Due to contrast load he was scheduled for staged intervention for limb salvage. He was arranged to be seen at the [facility] wound care center.  Hospital Course     Consultants: None   1. PVD: patient presented for staged intervention to right SFA. He underwent successful diamond back  orbital rotational atherectomy with chocolate balloon angioplasty followed by drug coated balloon angioplasty of the distal right SFA. He was recommended for aspirin and plavix x1 month, then continuation of plavix given need for eliquis. He was recommended to follow-up at the [facility] wound care center for aggressive local wound care of his right great toe gangrenous lesion.   - Continue aspirin x1 month - Continue plavix and statin  2. Chronic diastolic CHF: with some LE edema on exam. Not on BBlocker due to baseline bradycardia.  - Continue lasix 40mg  daily  3. HTN: BP persistently elevated this admission. Suspect he missed some medications with his procedure yesterday. Home doxazosin 2mg  daily and lasix 40mg  daily continued - Continue doxazosin and lasix  Follow as outpt    4. Paroxysmal atrial fibrillation: HR stable this admission and patient maintained sinus rhythm on telemetry monitor.  - Continue eliquis for stroke ppx - Continue prn metoprolol for rate control  5. Aortic stenosis: moderate on echo 11/2017. Scheduled for repeat echo 11/2018.  - Continue routine outpatient monitoring  6. Leukocytosis: WBC 14.9>19.5. No infectious complaints. Patient has been afebrile. Likely stress response.   7. CKD stage 3: Cr stable at 1.19 today. Improved from baseline.  - Continue to monitor routinely.     _____________  Discharge Vitals Blood pressure (!) 141/65, pulse (!) 58, temperature 97.7 F (36.5 C), temperature source Oral, resp. rate 18, height 5\' 11"  (1.803 m), weight 100.3 kg, SpO2 97 %.  Filed Weights   08/03/18 0750 08/04/18 0406  Weight: 99.8 kg 100.3 kg   Physical exam on the day of discharge: No CP    GEN: No acute distress.   Neck: No JVD, no carotid bruits Cardiac:  RRR,  Gr II/VI systolic murmur  No rubs, or gallops.  Respiratory: Clear to auscultation bilaterally, no wheezes/ rales/ rhonchi GI: NABS, Soft, nontender, non-distended  MS:Tr edema LE  Feet warm    No deformity. Neuro:  Nonfocal, moving all extremities spontaneously Psych: Normal affect     Labs & Radiologic Studies    CBC Recent Labs    08/03/18 0733 08/04/18 0427  WBC 14.9* 19.5*  HGB 10.8* 10.0*  HCT 32.8* 29.5*  MCV 97.3 93.9  PLT 344 060   Basic Metabolic Panel Recent Labs    08/03/18 0733 08/04/18 0427  NA 130* 132*  K 4.8 4.0  CL 97* 100   CO2 22 22  GLUCOSE 268* 234*  BUN 17 19  CREATININE 1.24 1.19  CALCIUM 8.6* 8.6*   Liver Function Tests No results for input(s): AST, ALT, ALKPHOS, BILITOT, PROT, ALBUMIN in the last 72 hours. No results for input(s): LIPASE, AMYLASE in the last 72 hours. Cardiac Enzymes No results for input(s): CKTOTAL, CKMB, CKMBINDEX, TROPONINI in the last 72 hours. BNP Invalid input(s): POCBNP D-Dimer No results for input(s): DDIMER in the last 72 hours. Hemoglobin A1C No results for input(s): HGBA1C in the last 72 hours. Fasting Lipid Panel No results for input(s): CHOL, HDL, LDLCALC, TRIG, CHOLHDL, LDLDIRECT in the last 72 hours. Thyroid Function Tests No results for input(s): TSH, T4TOTAL, T3FREE, THYROIDAB in the last 72 hours.  Invalid input(s): FREET3 _____________  Vas [place] Abi With/wo Tbi  Result Date: 07/21/2018 LOWER EXTREMITY DOPPLER STUDY Indications: Patient had his right toenail removed off the great toe by Dr.              Paulla Dolly on 05/07/2018 after he had clipped it himself. He developed an  infection. Since then the great toe has become gangrenous requiring              multiple rounds of antibiotics. Patient denies any significant leg              pain other than recent right groin cramping that started about 2              weeks ago. High Risk         Hypertension, hyperlipidemia, Diabetes, past history of Factors:          smoking.  Comparison Study: In 08/2016, an arterial Doppler showed an ABI of .82 on the                   right and .84 on the left. Performing Technologist: Sharlett Iles RVT  Examination Guidelines: A complete evaluation includes at minimum, Doppler waveform signals and systolic blood pressure reading at the level of bilateral brachial, anterior tibial, and posterior tibial arteries, when vessel segments are accessible. Bilateral testing is considered an integral part of a complete examination. Photoelectric Plethysmograph (PPG) waveforms and toe  systolic pressure readings are included as required and additional duplex testing as needed. Limited examinations for reoccurring indications may be performed as noted.  ABI Findings: +---------+------------------+-----+-------------------+---------------+  Right     Rt Pressure (mmHg) Index Waveform            Comment          +---------+------------------+-----+-------------------+---------------+  Brachial  153                                                           +---------+------------------+-----+-------------------+---------------+  ATA       67                 0.43  dampened monophasic                  +---------+------------------+-----+-------------------+---------------+  PTA       0                  0.00  absent              known occlusion  +---------+------------------+-----+-------------------+---------------+  PERO      65                 0.42  dampened monophasic                  +---------+------------------+-----+-------------------+---------------+  Great Toe 0                  0.00  absent                               +---------+------------------+-----+-------------------+---------------+ +---------+------------------+-----+----------+---------------+  Left      Lt Pressure (mmHg) Index Waveform   Comment          +---------+------------------+-----+----------+---------------+  Brachial  155                                                  +---------+------------------+-----+----------+---------------+  ATA       78  0.50  monophasic                  +---------+------------------+-----+----------+---------------+  PTA       0                  0.00  absent     known occlusion  +---------+------------------+-----+----------+---------------+  PERO      70                 0.45  monophasic                  +---------+------------------+-----+----------+---------------+  Great Toe 0                  0.00  absent                       +---------+------------------+-----+----------+---------------+ +-------+-----------+-----------+------------+------------+  ABI/TBI Today's ABI Today's TBI Previous ABI Previous TBI  +-------+-----------+-----------+------------+------------+  Right   .43         0           .82          .38           +-------+-----------+-----------+------------+------------+  Left    .50         0           .84          .31           +-------+-----------+-----------+------------+------------+ Bilateral ABIs and TBIs appear decreased compared to prior study on 08/20/2016.  Summary: Right: Resting right ankle-brachial index indicates severe right lower extremity arterial disease. The right toe-brachial index is abnormal. Left: Resting left ankle-brachial index indicates moderate left lower extremity arterial disease. The left toe-brachial index is abnormal.  *See table(s) above for measurements and observations.  Vascular consult recommended. Electronically signed by Ida Rogue MD on 07/21/2018 at 3:32:09 PM.    Final    Dg Hip Unilat With Pelvis 2-3 Views Right  Result Date: 07/09/2018 CLINICAL DATA:  Right-sided groin pain worsening for a few days, atraumatic. EXAM: DG HIP (WITH OR WITHOUT PELVIS) 2-3V RIGHT COMPARISON:  None. FINDINGS: There is no evidence of hip fracture or dislocation. There is no evidence of inflammatory arthropathy or bone lesion. Generalized enthesophyte formation. Mild bilateral hip spurring. Osteopenia and atherosclerosis. IMPRESSION: No acute or focal finding. Electronically Signed   By: Monte Fantasia M.D.   On: 07/09/2018 04:51   Vas [place] Lower Extremity Arterial Duplex  Result Date: 07/23/2018 LOWER EXTREMITY ARTERIAL DUPLEX STUDY Indications: Patient had his right toenail removed off the great toe by Dr.              Paulla Dolly on 05/07/2018 after he had clipped it himself. He developed an              infection. Since then the great toe has become gangrenous requiring              multiple rounds  of antibiotics. Patient denies any significant leg              pain other than recent right groin cramping that started about 2              weeks ago. High Risk         Hypertension, hyperlipidemia, Diabetes, past history of Factors:          smoking.  Current ABI: .43 on the right and .50 on the  left. Performing Technologist: Sharlett Iles RVT  Examination Guidelines: A complete evaluation includes B-mode imaging, spectral Doppler, color Doppler, and power Doppler as needed of all accessible portions of each vessel. Bilateral testing is considered an integral part of a complete examination. Limited examinations for reoccurring indications may be performed as noted.  +----------+--------+-----+--------+----------+--------------------------------+  RIGHT      PSV cm/s Ratio Stenosis Waveform   Comments                          +----------+--------+-----+--------+----------+--------------------------------+  CFA Prox   138                     triphasic  blunted                           +----------+--------+-----+--------+----------+--------------------------------+  DFA        265                     biphasic                                     +----------+--------+-----+--------+----------+--------------------------------+  SFA Prox   134                     triphasic  followed by low monophasic flow                                                  distal to the ostium              +----------+--------+-----+--------+----------+--------------------------------+  SFA Mid    43                      monophasic                                   +----------+--------+-----+--------+----------+--------------------------------+  SFA Distal 29                      monophasic                                   +----------+--------+-----+--------+----------+--------------------------------+  POP Prox   0              occluded                                               +----------+--------+-----+--------+----------+--------------------------------+  POP Mid    8                       monophasic                                   +----------+--------+-----+--------+----------+--------------------------------+  POP Distal 7  monophasic                                   +----------+--------+-----+--------+----------+--------------------------------+  TP Trunk   13                      monophasic                                   +----------+--------+-----+--------+----------+--------------------------------+ Extensive calcified plaque throughout. The above-the-knee popliteal artery appears occluded. Known occlusion in the PTA and peroneal artery. Probable baker's cyst, measuring 6.1 x .76 x 2.1 cm.  +----------+--------+-----+---------------+----------------+-------------------+  LEFT       PSV cm/s Ratio Stenosis        Waveform         Comments             +----------+--------+-----+---------------+----------------+-------------------+  CFA Prox   85                             barely triphasic blunted              +----------+--------+-----+---------------+----------------+-------------------+  DFA        83                             biphasic                              +----------+--------+-----+---------------+----------------+-------------------+  SFA Prox   109                            triphasic        followed by low                                                                  monophasic flow                                                                  distal to the                                                                    ostium               +----------+--------+-----+---------------+----------------+-------------------+  SFA Distal 43                             monophasic                            +----------+--------+-----+---------------+----------------+-------------------+  POP Prox   303            75-99% stenosis monophasic                             +----------+--------+-----+---------------+----------------+-------------------+  POP Mid    498            75-99% stenosis monophasic                            +----------+--------+-----+---------------+----------------+-------------------+  POP Distal 50                             monophasic                            +----------+--------+-----+---------------+----------------+-------------------+  TP Trunk   19                             monophasic                            +----------+--------+-----+---------------+----------------+-------------------+ Extensive calcified plaque throughout. A focal velocity elevation of 303 cm/s was obtained at above-the-popliteal artery with post stenotic turbulence with a VR of 5.5. Findings are characteristic of 75-99% stenosis. A 2nd focal velocity elevation was visualized, measuring 498 cm/s at behind-the-knee popliteal artery with post stenotic turbulence with a VR of 8.59. Findings are characteristic of 75-99% stenosis. Known occlusion in the PTA and ATA.  Left Stent(s): +---------------+--------+--------+--------+--------+  mid SFA         PSV cm/s Stenosis Waveform Comments  +---------------+--------+--------+--------+--------+  Prox to Stent   36                                   +---------------+--------+--------+--------+--------+  Proximal Stent  48                                   +---------------+--------+--------+--------+--------+  Mid Stent       87                                   +---------------+--------+--------+--------+--------+  Distal Stent    54                                   +---------------+--------+--------+--------+--------+  Distal to Stent 50                                   +---------------+--------+--------+--------+--------+ Patent mid SFA stent without restenosis.  Summary: Right: Total occlusion noted in the above-the-knee popliteal artery. Left: 75-99% stenosis noted in the above-the-knee and  behind-the-knee popliteal artery.  See table(s) above for measurements and observations. See today's ABI report. Vascular consult recommended. CC: Dr. [name], fax # [phone] Electronically signed by Ida Rogue MD on 07/23/2018 at 8:50:54 PM.    Final    Disposition   Patient was seen and examined by Dr. Harrington Challenger who  deemed patient as stable for discharge. Follow-up has been arranged. Discharge medications as listed below.   Follow-up Plans & Appointments    Follow-up Information    Lorretta Harp, MD Follow up on 08/19/2018.   Specialties: Cardiology, Radiology Why: Please arrive 15 minutes early for your 10:30am appointment Contact information: [address] [place] [address] [place] Follow up on 08/17/2018.   Specialty: Cardiology Why: Please arrive 15 minutes early for your 9:00am post-procedure follow-up ultrasound  Contact information: [place] Follow up on 08/05/2018.   Why: Please arrive 15 minutes early for you 2:15pm wound treatment appointment Contact information: [facility] - wound treatment           Discharge Medications   Allergies as of 08/04/2018      Reactions   Iodinated Diagnostic Agents    Other reaction(s): Fever   Iodine-131 Other (See Comments)   Breakout, fever   Iohexol     Code: RASH, Desc: PT STATES HOT ALL OVER HAD TO HAVE ICE PACKS ALL OVER HIM AND DIFF BREATHING, PT REFUSED IV DYE      Medication List    STOP taking these medications   NITROGLYCERIN ER PO   predniSONE 50 MG tablet Commonly known as: DELTASONE     TAKE these medications   apixaban 2.5 MG Tabs tablet Commonly known as: ELIQUIS Take 1 tablet (2.5 mg total) by mouth 2 (two) times daily.   aspirin 81 MG EC tablet Take 1 tablet (81 mg total) by mouth daily. Stop taking aspirin 09/04/2018. Start taking  on: August 05, 2018   atorvastatin 40 MG tablet Commonly known as: LIPITOR TAKE 1 TABLET BY MOUTH ONCE DAILY What changed: when to take this   calcium carbonate 500 MG chewable tablet Commonly known as: TUMS - dosed in mg elemental calcium Chew 1 tablet by mouth as needed for indigestion or heartburn.   clopidogrel 75 MG tablet Commonly known as: PLAVIX Take 1 tablet (75 mg total) by mouth daily with breakfast. Start taking on: August 05, 2018   Cosamin DS 500-400 MG tablet Generic drug: glucosamine-chondroitin Take 1 tablet by mouth 2 (two) times a day.   diphenhydrAMINE 50 MG tablet Commonly known as: BENADRYL PLEASE FOLLOW THE INSTRUCTIONS ON YOUR AFTER VISIT SUMMARY   docusate sodium 100 MG capsule Commonly known as: COLACE Take 100 mg by mouth 2 (two) times daily as needed (for use with oxycodone to prevent constipation.).   dorzolamide-timolol 22.3-6.8 MG/ML ophthalmic solution Commonly known as: COSOPT Place 1 drop into both eyes 2 (two) times daily.   doxazosin 2 MG tablet Commonly known as: Cardura Take 1 tablet (2 mg total) by mouth at bedtime.   finasteride 5 MG tablet Commonly known as: PROSCAR Take 5 mg by mouth every evening.   FISH OIL PO Take 1 capsule by mouth daily.   furosemide 40 MG tablet Commonly known as: LASIX TAKE 1 TABLET(40 MG) BY MOUTH DAILY What changed:   how much to take  how to take this  when to take this   glipiZIDE 5 MG tablet Commonly known as: GLUCOTROL Take 5 mg by mouth 2 (two) times daily.   isosorbide mononitrate 30 MG 24 hr tablet Commonly known as: IMDUR Take 0.5 tablets (15 mg total) by mouth daily.   levothyroxine 50 MCG tablet Commonly  known as: SYNTHROID Take 50 mcg by mouth daily before breakfast.   metoprolol tartrate 25 MG tablet Commonly known as: LOPRESSOR Take 12.5 mg as needed for palpitations/heart racing What changed:   how much to take  how to take this  when to take this  reasons to take  this  additional instructions   oxyCODONE-acetaminophen 5-325 MG tablet Commonly known as: Percocet Take 1-2 tablets by mouth every 8 (eight) hours as needed for severe pain.   pyridOXINE 100 MG tablet Commonly known as: VITAMIN B-6 Take 100 mg by mouth daily.   ranitidine 150 MG tablet Commonly known as: ZANTAC Take 150 mg by mouth at bedtime.   tamsulosin 0.4 MG Caps capsule Commonly known as: FLOMAX Take 0.4 mg by mouth 2 (two) times daily.   vitamin C 500 MG tablet Commonly known as: ASCORBIC ACID Take 500 mg by mouth daily.   vitamin E 400 UNIT capsule Take 400 Units by mouth daily.   Zinc 50 MG Tabs Take 50 mg by mouth every other day.          Outstanding Labs/Studies   Follow-up LE duplex studies have been ordered for 08/17/2018.   Duration of Discharge Encounter   Greater than 30 minutes including physician time.  Signed, Abigail Butts PA-C 08/04/2018, 9:46 AM   Pt seen and examined   I agree with findings of K Kroeger above   The PE in D/C summary is my own evaluation  VOlume overall good     Pt hs f/u scheduled on 8/3 for duplex eval legs     Stable for discharge  Dorris Carnes MD

## 2018-08-04 NOTE — Progress Notes (Signed)
Inpatient Diabetes Program Recommendations  AACE/ADA: New Consensus Statement on Inpatient Glycemic Control (2015)  Target Ranges:  Prepandial:   less than 140 mg/dL      Peak postprandial:   less than 180 mg/dL (1-2 hours)      Critically ill patients:  140 - 180 mg/dL   Lab Results  Component Value Date   GLUCAP 330 (H) 08/03/2018   HGBA1C 7.0 (H) 05/29/2018    Review of Glycemic Control  Diabetes history: DM2 Outpatient Diabetes medications: glipizide 5 mg bid Current orders for Inpatient glycemic control: glipizide 5 mg bid  HgbA1C - 7.0% - good glycemic control   Inpatient Diabetes Program Recommendations:     Add Novolog 0-9 units tidwc  Will follow while inpatient.  Thank you. Lorenda Peck, RD, LDN, CDE Inpatient Diabetes Coordinator [phone]

## 2018-08-05 ENCOUNTER — Ambulatory Visit (HOSPITAL_COMMUNITY): Payer: Medicare Other

## 2018-08-05 ENCOUNTER — Ambulatory Visit: Payer: Medicare Other | Admitting: Cardiology

## 2018-08-05 ENCOUNTER — Other Ambulatory Visit: Payer: Self-pay

## 2018-08-05 DIAGNOSIS — R600 Localized edema: Secondary | ICD-10-CM

## 2018-08-05 DIAGNOSIS — S91201A Unspecified open wound of right great toe with damage to nail, initial encounter: Secondary | ICD-10-CM

## 2018-08-05 DIAGNOSIS — S91301A Unspecified open wound, right foot, initial encounter: Secondary | ICD-10-CM | POA: Diagnosis not present

## 2018-08-05 NOTE — Therapy (Signed)
[place], [place], [address] Phone: [phone]   Fax:  [phone]  Wound Care Therapy  Patient Details  Name: [name] MRN: [id number] Date of Birth: [birth year] Referring Provider (PT): [name], MD   Encounter Date: 08/05/2018  PT End of Session - 08/05/18 1515    Visit Number  15    Number of Visits  15    Date for PT Re-Evaluation  07/24/18    Authorization Type  [employer] Medicare    Authorization Time Period  06/12/18-07/24/18; 08/05/18    Authorization - Visit Number  1    Authorization - Number of Visits  10    PT Start Time  1423    PT Stop Time  1505    PT Time Calculation (min)  42 min    Activity Tolerance  Patient tolerated treatment well    Behavior During Therapy  [facility] for tasks assessed/performed       Past Medical History:  Diagnosis Date  . Arthritis   . Borderline diabetic   . Glaucoma   . Hyperlipidemia   . Hypertension   . PVD (peripheral vascular disease) ([place])   . Sleep apnea    Cpap ordered but doesnt use    Past Surgical History:  Procedure Laterality Date  . ABDOMINAL AORTOGRAM W/LOWER EXTREMITY N/A 07/27/2018   Procedure: ABDOMINAL AORTOGRAM W/LOWER EXTREMITY;  Surgeon: Lorretta Harp, MD;  Location: [facility];  Service: Cardiovascular;  Laterality: N/A;  . APPENDECTOMY  1943  . CATARACT EXTRACTION  2012   x2  . COLONOSCOPY N/A 11/01/2014   Procedure: COLONOSCOPY;  Surgeon: Aviva Signs, MD;  [facility];  Service: Gastroenterology;  Laterality: N/A;  . ESOPHAGOGASTRODUODENOSCOPY N/A 11/01/2014   Procedure: ESOPHAGOGASTRODUODENOSCOPY (EGD);  Surgeon: Aviva Signs, MD;  [facility];  Service: Gastroenterology;  Laterality: N/A;  . HERNIA REPAIR Left   . INGUINAL HERNIA REPAIR Right 03/01/2013   Procedure: RIGHT INGUINAL HERNIORRHAPHY;  Surgeon: Jamesetta So, MD;  Location: [facility];  Service: General;  Laterality: Right;  . INSERTION  OF MESH Right 03/01/2013   Procedure: INSERTION OF MESH;  Surgeon: Jamesetta So, MD;  Location: [facility];  Service: General;  Laterality: Right;  . NM MYOCAR PERF WALL MOTION  06/05/2010   no significant ischemia  . PERIPHERAL VASCULAR ATHERECTOMY Right 08/03/2018   Procedure: PERIPHERAL VASCULAR ATHERECTOMY;  Surgeon: Lorretta Harp, MD;  Location: [facility];  Service: Cardiovascular;  Laterality: Right;  . stent  06/14/2010   left leg  . US ECHOCARDIOGRAPHY  01/21/2006   moderate mitral annular ca+, mild MR, AOV moderately sclerotic.    There were no vitals filed for this visit.     Wound Therapy - 08/05/18 1415    Subjective  Patient and his daughter arrive for appointment. Therapist asked his daughter to join for hte session to ensure all information is accurate and develop a plan for treatment. Pt and daughter report his surgery went well to remove calcification in arteries behind his Rt knee however he still has more blockages in lower leg as well. They have an appointment but it is not till later in August to treat this and both the patient and daughter express frustration. They feel his toe and foot have severly worsened and if it needs to be removed are prepared for this outcome. The patietn reports his Rt foot has been hurting more and more severly  and that last week he hit his heal against a stable on his recliner and created another wound.     Patient and Family Stated Goals  Wound to heal    Date of Onset  05/07/18   Nail removed by Dr. Paulla Dolly   Pain Scale  0-10    Pain Score  6     Patients Stated Pain Goal  0    Pain Intervention(s)  Emotional support    Evaluation and Treatment Procedures Explained to Patient/Family  Yes    Evaluation and Treatment Procedures  agreed to    Wound Properties Date First Assessed: 08/03/18 Time First Assessed: 1530 Wound Type: Other (Comment) , arterial wound  Location: Foot Location Orientation: Right;Anterior;Mid Present on Admission:  Yes Final Assessment Date: 08/04/18 Final Assessment Time: 0820   Wound Image  --  (Pended)     Wound Properties Date First Assessed: 08/03/18 Time First Assessed: 1530 Wound Type: Other (Comment) , unhealed wound from toenail removal  Location: Toe (Comment  which one) , Right great toe  Location Orientation: Right Present on Admission: Yes Final Assessment Date: 08/04/18 Final Assessment Time: 0820   Wound Image  --  (Pended)     Wound Properties Date First Assessed: 07/20/18 Time First Assessed: 1135 Wound Type: Other (Comment) Location: Foot Location Orientation: Upper   Wound Image  Images linked: 1    Dressing Type  Impregnated gauze (bismuth);Gauze (Comment)    Dressing Changed  Changed    Dressing Status  Old drainage    Dressing Change Frequency  PRN    Site / Wound Assessment  Yellow;Brown;Pink;Pale    % Wound base Red or Granulating  0%    % Wound base Yellow/Fibrinous Exudate  40%    % Wound base Black/Eschar  10%   brown/black   % Wound base Other/Granulation Tissue (Comment)  50%   pale/pink   Peri-wound Assessment  Erythema (blanchable);Edema;Maceration;Pink    Wound Length (cm)  3.4 cm   0.4   Wound Width (cm)  4 cm   0.8   Wound Depth (cm)  0.1 cm   0.1   Wound Volume (cm^3)  1.36 cm^3    Wound Surface Area (cm^2)  13.6 cm^2    Margins  Unattached edges (unapproximated)    Drainage Amount  Minimal    Drainage Description  Serosanguineous    Non-staged Wound Description  Partial thickness    Treatment  Cleansed;Debridement (Selective)    Wound Properties Date First Assessed: 05/30/18 Time First Assessed: 2200 Wound Type: Other (Comment) Location: Toe (Comment  which one) , right great toe  Location Orientation: Right Wound Description (Comments): missing toenail Present on Admission: Yes   Wound Image  Images linked: 1    Dressing Type  Impregnated gauze (bismuth);Gauze (Comment)    Dressing Changed  Changed    Dressing Status  Old drainage    Dressing Change  Frequency  PRN    Site / Wound Assessment  Black    % Wound base Red or Granulating  0%    % Wound base Yellow/Fibrinous Exudate  0%    % Wound base Black/Eschar  0%    % Wound base Other/Granulation Tissue (Comment)  100%    Peri-wound Assessment  Erythema (non-blanchable);Edema   hard   Wound Length (cm)  4.3 cm    Wound Width (cm)  --   full circumference of great toe   Drainage Amount  None    Treatment  --  dressing changed   Wound Properties Date First Assessed: 08/05/18 Time First Assessed: 1430 Wound Type: Laceration , pt hit heel against recliner  Location: Heel Location Orientation: Right;Posterior;Lateral Wound Description (Comments): circular wound  Present on Admission: Yes Final Assessment Date: - Final Assessment Time: -   Wound Image  Images linked: 1    Dressing Type  Alginate;Gauze (Comment)    Dressing Changed  Changed    Dressing Status  Old drainage    Dressing Change Frequency  PRN    Site / Wound Assessment  Black;Brown;Yellow;Pink    % Wound base Red or Granulating  0%    % Wound base Yellow/Fibrinous Exudate  25%    % Wound base Black/Eschar  75%   blackish red   Peri-wound Assessment  Edema;Maceration;Erythema (non-blanchable)    Wound Length (cm)  1.4 cm    Wound Width (cm)  1.4 cm    Wound Depth (cm)  0.2 cm    Wound Volume (cm^3)  0.39 cm^3    Wound Surface Area (cm^2)  1.96 cm^2    Margins  Unattached edges (unapproximated)    Drainage Amount  Minimal    Drainage Description  Serosanguineous    Treatment  Cleansed;Debridement (Selective)    Selective Debridement - Location  forefoot wound and heel wound    Selective Debridement - Tools Used  Forceps;Scissors    Selective Debridement - Tissue Removed  devitalized tissue and sloth     Wound Therapy - Clinical Statement  Mr. Fidel is returning to our clinic for wound care after undergoing peripheral vascular atherectomy to the distal right SFA on 08/03/18. He had a follow up appointment to assess  further distal obstructions in vasculature but pt and his daughter are very concerned it is not early enough and are planning to request and earlier appointment. The patient arrives reporting he has had increasing Rt foot pain since returning home. Rt great toe wound has severely progressed since last being seen in our clinic and presents as hardened black eschar with withering of tissue around entire circumference of toe 4.3 cm in length from distal tip proximally. His forefoot wound has increased in size substantially and has yellow adhered slough with dark brown/black spots in the center. This wound is presenting similarly to the initial presentation of the Rt great toe when patient initiated wound care in our office in March. The patient also presents today with a new wound on his posterolateral Rt heel and reports he hit his foot on a staple form his recliners upholstery. The wound is circular and primarily blackish red in the wound bed with yellow adhered slough in the center. The periwound is boggy and macerated from drainage.  Applied xeroform and gauze to great toe and forefoot wound and alginate to address drainage of heel. Wrapped patient's toe and foot with gauze to hold dressings in place. Debridement was performed only to slough on forefoot wound and heel as no debridement is indicated for the necrotic toe. Educated patient that our clinic cannot provide further treatment to wound as no intervention will improve wound healing if he has ongoing obstruction in LE. Educated pt and daughter that the process for LE surgical intervention to address impaired circulation is and that amputation of necrotic digits or more is also possible in the future. Patient and daughter requested that today's note be routed to cardiovascular MD's office and wound center at [facility] as he will be transitioning care there. They plan to try to get  vascular appointments moved to an earlier date. He will be discharged from our  office.     Wound Therapy - Functional Problem List  walking, showering/bathing, infection risk    Factors Delaying/Impairing Wound Healing  Diabetes Mellitus;Multiple medical problems;Tobacco use;Vascular compromise   PAD, PVD, HTN, DM   Hydrotherapy Plan  Debridement;Dressing change;Patient/family education;Pulsatile lavage with suction;Other (comment)    Wound Therapy - Frequency  3X / week   3x/week for 3 weeks, 2x/week for weeks   Wound Therapy - Current Recommendations  Surgery consult    Wound Therapy - Follow Up Recommendations  [place]    Wound Plan  Patient will be discharged from our clinic. Pt and his daughter have expressed major concern that surgical interventions are being pushed out too far as his wound are severely worsening and he has developed a third wound. He has plans to follow up with cardiovascular office and wound care center in [place] and this therapist will route recent note with wound images to both offices per patient request.     Dressing   Toe and forefoot: xeroform and gauze    Dressing  Heel: alginate and gauze         PT Short Term Goals - 08/05/18 1513      PT SHORT TERM GOAL #1   Title  Wound will be 100% red or granulated tissue with no slough, eshcar, or necrotic tissue present to indicate wound is moving into proliferative phase of healing.     Baseline  pt conitnues to have adherant slough and eschar to wound    Time  3    Period  Weeks    Status  Not Met    Target Date  07/03/18      PT SHORT TERM GOAL #2   Title  Patient will no longer have edema or errythema present around periwound indicating no persisting signs of local infection.    Baseline  errythema and edam persists    Time  3    Period  Weeks    Status  Not Met        PT Long Term Goals - 08/05/18 1513      PT LONG TERM GOAL #1   Time  6    Period  Weeks    Status  Not Met      PT LONG TERM GOAL #2   Title  Patient and daughter will demonstrate proper self  care technique to complete wound care management at home when appropriate and wound healing progressed enough.    Time  6    Period  Weeks    Status  Not Met        Plan - 08/05/18 1512    Clinical Impression Statement  see above    Personal Factors and Comorbidities  Age;Comorbidity 3+    Examination-Activity Limitations  Bathing;Dressing;Carry;Lift;Locomotion Level    Examination-Participation Restrictions  Cleaning;Laundry;Yard Work;Community Activity;Driving;Shop    Stability/Clinical Decision Making  Evolving/Moderate complexity    Rehab Potential  Fair    PT Frequency  3x / week   3x/week for 3 weeks then 2x/week for 3 more weeks   PT Duration  6 weeks    PT Treatment/Interventions  ADLs/Self Care Home Management;DME Instruction;Manual techniques;Patient/family education;Therapeutic exercise;Taping;Other (comment)   skilled wound care   PT Next Visit Plan  see above    Consulted and Agree with Plan of Care  Patient       Patient will benefit from skilled  therapeutic intervention in order to improve the following deficits and impairments:  Abnormal gait, Decreased balance, Decreased skin integrity, Pain, Increased edema, Decreased range of motion, Decreased mobility, Difficulty walking  Visit Diagnosis: 1. Open wound of right great toe with damage to nail, initial encounter   2. Localized edema   3. Open wound of right foot, initial encounter        Problem List Patient Active Problem List   Diagnosis Date Noted  . Critical lower limb ischemia 08/03/2018  . Type 2 diabetes mellitus with foot ulcer, without long-term current use of insulin ([place])   . Gastroesophageal reflux disease   . Cellulitis of great toe of right foot 05/29/2018  . Atrial fibrillation, chronic   . Chest pain 07/25/2017  . PAF (paroxysmal atrial fibrillation) ([place]) 07/25/2017  . BPH (benign prostatic hyperplasia) 07/25/2017  . Shortness of breath 05/11/2017  . Hypothyroidism 05/11/2017  .  Chronic diastolic CHF (congestive heart failure) ([place]) 05/11/2017  . Aortic valve stenosis 06/12/2016  . RBBB 06/12/2016  . PAD (peripheral artery disease) ([place]) 08/04/2012  . Essential hypertension 08/04/2012  . Hyperlipidemia 08/04/2012  . Dizziness 08/04/2012    Kipp Brood, PT, DPT, [facility] Physical Therapist with [facility]  08/06/2018 3:08 PM     [place] [address] [place], [place], [address] Phone: [phone]   Fax:  [phone]  Name: [name] MRN: [id number] Date of Birth: [birth year]

## 2018-08-06 ENCOUNTER — Encounter (HOSPITAL_COMMUNITY): Payer: Medicare Other

## 2018-08-06 ENCOUNTER — Telehealth: Payer: Self-pay | Admitting: Cardiology

## 2018-08-06 ENCOUNTER — Telehealth (HOSPITAL_COMMUNITY): Payer: Self-pay

## 2018-08-06 NOTE — Telephone Encounter (Signed)
New Message:     Pt's daughter says she have to come in with pt for his appt on 08-07-18. She says she always come in with the patient

## 2018-08-06 NOTE — Telephone Encounter (Signed)

## 2018-08-06 NOTE — Telephone Encounter (Signed)
pt's daughter called to cancel the pt's remaining appts due to he is not getting any better and he will be now going to the wound clinic at [facility].

## 2018-08-07 ENCOUNTER — Encounter (HOSPITAL_COMMUNITY): Payer: Medicare Other

## 2018-08-07 ENCOUNTER — Ambulatory Visit (HOSPITAL_COMMUNITY): Payer: Medicare Other

## 2018-08-07 ENCOUNTER — Ambulatory Visit (INDEPENDENT_AMBULATORY_CARE_PROVIDER_SITE_OTHER): Payer: Medicare Other | Admitting: Cardiology

## 2018-08-07 ENCOUNTER — Other Ambulatory Visit: Payer: Self-pay

## 2018-08-07 DIAGNOSIS — L03031 Cellulitis of right toe: Secondary | ICD-10-CM

## 2018-08-07 MED ORDER — OXYCODONE-ACETAMINOPHEN 5-325 MG PO TABS: 1.0000 | ORAL_TABLET | Freq: Three times a day (TID) | ORAL | 0 refills | Status: DC | PRN

## 2018-08-07 NOTE — Progress Notes (Signed)
Cardiology Office Note:    Date:  08/07/2018   ID:  [name], DOB [birth year], MRN [id number]  PCP:  [name], MD  Cardiologist:  Pixie Casino, MD  Electrophysiologist:  None   Referring MD: [name], MD   Rt foot pain  History of Present Illness:    [name] is a 85 y.o. male with a hx of PVD, hypertension, hyperlipidemia, and obstructive sleep apnea. He underwent stenting his left SFA May 2012.  He had his right toenail removed by Dr. Paulla Dolly 05/07/2018 after he had clipped it himself prior to that and and developed an infection. Since that time he has had development of gangrene and has required multiple rounds of antibiotics. He has also had worsening lower extremity edema.He had Dopplers performed 07/21/2018 revealed an ABI of 0.43 on the right with 0 vessel runoff.  He underwent peripheral angiography on 07/27/2018 via the left femoral approach revealing a patent mid to distal left SFA stent, high-grade calcified disease beyond that and 0 vessel runoff on the left as well as on the right. Also had a 99% calcified lesion in the distal right SFA/above-knee popliteal artery. Due to contrast load he was scheduled for staged intervention for limb salvage. He was arranged to be seen at the [facility] wound care center. On 08/03/2018 the patient had successful diamondback orbital rotational atherectomy, chocolate balloon angioplasty followed by drug-coated balloon angioplasty of a highly calcified segmental distal right SFA, above the knee popliteal artery CTO in the setting of critical limb ischemia with an occluded distal vessels and 0 vessel runoff. There was very sluggish flow suggesting potential for "distal embolization. Vasodilators were administered.   He was brought in to the office by his daughter today.  Dr Gwenlyn Found has arranged for LEA dopplers on 7/27, Wound Care visit 7/28, and an OV with dr Gwenlyn Found 8/5.  The daughter is concerned because her father continues  to have pain.  She is afraid he loose some of toes or worse.    Past Medical History:  Diagnosis Date  . Arthritis   . Borderline diabetic   . Glaucoma   . Hyperlipidemia   . Hypertension   . PVD (peripheral vascular disease) ([place])   . Sleep apnea    Cpap ordered but doesnt use    Past Surgical History:  Procedure Laterality Date  . ABDOMINAL AORTOGRAM W/LOWER EXTREMITY N/A 07/27/2018   Procedure: ABDOMINAL AORTOGRAM W/LOWER EXTREMITY;  Surgeon: Lorretta Harp, MD;  Location: [place] [facility];  Service: Cardiovascular;  Laterality: N/A;  . APPENDECTOMY  1943  . CATARACT EXTRACTION  2012   x2  . COLONOSCOPY N/A 11/01/2014   Procedure: COLONOSCOPY;  Surgeon: Aviva Signs, MD;  Location: [facility];  Service: Gastroenterology;  Laterality: N/A;  . ESOPHAGOGASTRODUODENOSCOPY N/A 11/01/2014   Procedure: ESOPHAGOGASTRODUODENOSCOPY (EGD);  Surgeon: Aviva Signs, MD;  Location: [facility];  Service: Gastroenterology;  Laterality: N/A;  . HERNIA REPAIR Left   . INGUINAL HERNIA REPAIR Right 03/01/2013   Procedure: RIGHT INGUINAL HERNIORRHAPHY;  Surgeon: Jamesetta So, MD;  Location: [facility];  Service: General;  Laterality: Right;  . INSERTION OF MESH Right 03/01/2013   Procedure: INSERTION OF MESH;  Surgeon: Jamesetta So, MD;  Location: [facility];  Service: General;  Laterality: Right;  . NM MYOCAR PERF WALL MOTION  06/05/2010   no significant ischemia  . PERIPHERAL VASCULAR ATHERECTOMY Right 08/03/2018   Procedure: PERIPHERAL VASCULAR ATHERECTOMY;  Surgeon: Lorretta Harp,  MD;  Location: [place] [facility];  Service: Cardiovascular;  Laterality: Right;  . stent  06/14/2010   left leg  . US ECHOCARDIOGRAPHY  01/21/2006   moderate mitral annular ca+, mild MR, AOV moderately sclerotic.    Current Medications: Current Meds  Medication Sig  . apixaban (ELIQUIS) 2.5 MG TABS tablet Take 1 tablet (2.5 mg total) by mouth 2 (two) times daily.  [name] aspirin EC 81 MG EC tablet Take 1 tablet  (81 mg total) by mouth daily. Stop taking aspirin 09/04/2018.  [name] atorvastatin (LIPITOR) 40 MG tablet TAKE 1 TABLET BY MOUTH ONCE DAILY (Patient taking differently: Take 40 mg by mouth daily after supper. )  . calcium carbonate (TUMS - DOSED IN MG ELEMENTAL CALCIUM) 500 MG chewable tablet Chew 1 tablet by mouth as needed for indigestion or heartburn.  . clopidogrel (PLAVIX) 75 MG tablet Take 1 tablet (75 mg total) by mouth daily with breakfast.  . COSAMIN DS 500-400 MG tablet Take 1 tablet by mouth 2 (two) times a day.  . diphenhydrAMINE (BENADRYL) 50 MG tablet PLEASE FOLLOW THE INSTRUCTIONS ON YOUR AFTER VISIT SUMMARY  . docusate sodium (COLACE) 100 MG capsule Take 100 mg by mouth 2 (two) times daily as needed (for use with oxycodone to prevent constipation.).  [name] dorzolamide-timolol (COSOPT) 22.3-6.8 MG/ML ophthalmic solution Place 1 drop into both eyes 2 (two) times daily.  [name] doxazosin (CARDURA) 2 MG tablet Take 1 tablet (2 mg total) by mouth at bedtime.  . finasteride (PROSCAR) 5 MG tablet Take 5 mg by mouth every evening.   . furosemide (LASIX) 40 MG tablet TAKE 1 TABLET(40 MG) BY MOUTH DAILY (Patient taking differently: Take 40 mg by mouth daily. TAKE 1 TABLET(40 MG) BY MOUTH DAILY)  . glipiZIDE (GLUCOTROL) 5 MG tablet Take 5 mg by mouth 2 (two) times daily.   . isosorbide mononitrate (IMDUR) 30 MG 24 hr tablet Take 0.5 tablets (15 mg total) by mouth daily.  [name] levothyroxine (SYNTHROID, LEVOTHROID) 50 MCG tablet Take 50 mcg by mouth daily before breakfast.   . metoprolol tartrate (LOPRESSOR) 25 MG tablet Take 12.5 mg as needed for palpitations/heart racing (Patient taking differently: Take 12.5 mg by mouth daily as needed (palpitations/heart racing). Take 12.5 mg as needed for palpitations/heart racin)  . Omega-3 Fatty Acids (FISH OIL PO) Take 1 capsule by mouth daily.  [name] oxyCODONE-acetaminophen (PERCOCET) 5-325 MG tablet Take 1-2 tablets by mouth every 8 (eight) hours as needed for severe pain.   [name] pyridOXINE (VITAMIN B-6) 100 MG tablet Take 100 mg by mouth daily.  . ranitidine (ZANTAC) 150 MG tablet Take 150 mg by mouth at bedtime.  . tamsulosin (FLOMAX) 0.4 MG CAPS Take 0.4 mg by mouth 2 (two) times daily.   . vitamin C (ASCORBIC ACID) 500 MG tablet Take 500 mg by mouth daily.  . vitamin E 400 UNIT capsule Take 400 Units by mouth daily.  . Zinc 50 MG TABS Take 50 mg by mouth every other day.   Current Facility-Administered Medications for the 08/07/18 encounter (Office Visit) with Erlene Quan, PA-C  Medication  . 0.9 %  sodium chloride infusion     Allergies:   Iodinated diagnostic agents, Iodine-131, and Iohexol   Social History   Socioeconomic History  . Marital status: Widowed    Spouse name: Not on file  . Number of children: Not on file  . Years of education: Not on file  . Highest education level: Not on file  Occupational History  .  Not on file  Social Needs  . Financial resource strain: Not on file  . Food insecurity    Worry: Not on file    Inability: Not on file  . Transportation needs    Medical: Not on file    Non-medical: Not on file  Tobacco Use  . Smoking status: Former Smoker    Quit date: 01/14/1935    Years since quitting: 83.6  . Smokeless tobacco: Former Systems developer    Types: Chew  Substance and Sexual Activity  . Alcohol use: No  . Drug use: No  . Sexual activity: Not on file  Lifestyle  . Physical activity    Days per week: Not on file    Minutes per session: Not on file  . Stress: Not on file  Relationships  . Social Herbalist on phone: Not on file    Gets together: Not on file    Attends religious service: Not on file    Active member of club or organization: Not on file    Attends meetings of clubs or organizations: Not on file    Relationship status: Not on file  Other Topics Concern  . Not on file  Social History Narrative  . Not on file     Family History: The patient's family history includes Cancer in his  mother; Heart attack in his brother, brother, and father; Stroke in his father.  ROS:   Please see the history of present illness.     All other systems reviewed and are negative.  EKGs/Labs/Other Studies Reviewed:    The following studies were reviewed today:   Recent Labs: 07/23/2018: TSH 3.020 08/04/2018: BUN 19; Creatinine, Ser 1.19; Hemoglobin 10.0; Platelets 294; Potassium 4.0; Sodium 132  Recent Lipid Panel    Component Value Date/Time   CHOL 99 08/06/2012 1116   TRIG 89 08/06/2012 1116   HDL 39 (L) 08/06/2012 1116   LDLCALC 42 08/06/2012 1116    Physical Exam:    VS:  BP 131/65   Pulse 67   Ht 5' 11.5" (1.816 m)   Wt 218 lb (98.9 kg)   SpO2 97%   BMI 29.98 kg/m     Wt Readings from Last 3 Encounters:  08/07/18 218 lb (98.9 kg)  08/04/18 221 lb 1.9 oz (100.3 kg)  07/31/18 222 lb 9.6 oz (101 kg)     GEN: Elderly male, uncomfortable in a wheel chair.   HEENT: Normal NECK: No JVD ABDOMEN: non-distended MUSCULOSKELETAL: Rt foot bandaged, discolored toes of his Rt foot SKIN: pale, warm and dry NEUROLOGIC:  Alert and oriented x 3 PSYCHIATRIC:  Normal affect   ASSESSMENT:    Cellulitis of great toe of right foot Pt is in the office today c/o Rt foot pain.  He was just discharged 08/04/2018 after Rt SFA PTA.  He residual 3V distal disease.  He is being followed at the wound clinic.   PLAN:    I explained that Dr Gwenlyn Found was in the hospital- that's who she wanted to see.  I was able to get in touch with Dr Gwenlyn Found and passed on the daughter's phone number.  I did refill the patient's pain medication for 5 days.    Medication Adjustments/Labs and Tests Ordered: Current medicines are reviewed at length with the patient today.  Concerns regarding medicines are outlined above.  No orders of the defined types were placed in this encounter.  No orders of the defined types were placed in this  encounter.   There are no Patient Instructions on file for this visit.    Angelena Form, PA-C  08/07/2018 11:42 AM    [facility]

## 2018-08-07 NOTE — Assessment & Plan Note (Signed)
Pt is in the office today c/o Rt foot pain.  He was just discharged 08/04/2018 after Rt SFA PTA.  He residual 3V distal disease.  He is being followed at the wound clinic.

## 2018-08-07 NOTE — Patient Instructions (Signed)
Medication Instructions:  Your physician recommends that you continue on your current medications as directed. Please refer to the Current Medication list given to you today.  If you need a refill on your cardiac medications before your next appointment, please call your pharmacy.   Lab work: NONE ordered at this time of appointment   If you have labs (blood work) drawn today and your tests are completely normal, you will receive your results only by: [name] MyChart Message (if you have MyChart) OR . A paper copy in the mail If you have any lab test that is abnormal or we need to change your treatment, we will call you to review the results.  Testing/Procedures: NONE ordered at this time of appointment '  Follow-Up: At [facility], you and your health needs are our priority.  As part of our continuing mission to provide you with exceptional heart care, we have created designated Provider Care Teams.  These Care Teams include your primary Cardiologist (physician) and Advanced Practice Providers (APPs -  Physician Assistants and Nurse Practitioners) who all work together to provide you with the care you need, when you need it. . Your physician recommends that you Keep your scheduled appointment with Dr. Gwenlyn Found  Any Other Special Instructions Will Be Listed Below (If Applicable).

## 2018-08-10 ENCOUNTER — Ambulatory Visit (HOSPITAL_COMMUNITY): Payer: Medicare Other | Admitting: Physical Therapy

## 2018-08-10 ENCOUNTER — Ambulatory Visit (HOSPITAL_COMMUNITY)
Admission: RE | Admit: 2018-08-10 | Discharge: 2018-08-10 | Disposition: A | Discharge status: Home / Self Care | Payer: Medicare Other | Source: Ambulatory Visit | Attending: Cardiology | Admitting: Cardiology

## 2018-08-10 ENCOUNTER — Other Ambulatory Visit: Payer: Self-pay

## 2018-08-10 ENCOUNTER — Other Ambulatory Visit (HOSPITAL_COMMUNITY): Payer: Self-pay | Admitting: Cardiovascular Disease

## 2018-08-10 ENCOUNTER — Telehealth: Payer: Self-pay | Admitting: Cardiovascular Disease

## 2018-08-10 DIAGNOSIS — I739 Peripheral vascular disease, unspecified: Secondary | ICD-10-CM | POA: Insufficient documentation

## 2018-08-10 DIAGNOSIS — Z9889 Other specified postprocedural states: Secondary | ICD-10-CM

## 2018-08-10 NOTE — Telephone Encounter (Signed)
Spoke with pt daughter who is concerned pt will lose his toe(s) or worse if he does not have another procedure done soon and stated that Dr. Gwenlyn Found requested that pt come in for an OV tomorrow, 7/28. She states pt will present to office for dopplers at 1500 today. No OVs tomorrow; only virtual clinic. Please advise

## 2018-08-10 NOTE — Telephone Encounter (Signed)
Received mobile message from Dr. Gwenlyn Found stating that he is unable to review pt dopplers today but pt may be set up for virtual appt on 7/28 to discuss results.  Contacted pt daughter [name] and informed of the above. Virtual appt set up for 7/28 at 4:00pm. Reviewed doximity process with her. She verbalized understanding

## 2018-08-10 NOTE — Telephone Encounter (Signed)
New message   Patient's daughter states that she spoke to Dr. Gwenlyn Found and he wants patient to come into the office on tomorrow. I did not see an appointment. Please advise.

## 2018-08-11 ENCOUNTER — Telehealth: Payer: Self-pay

## 2018-08-11 ENCOUNTER — Telehealth (INDEPENDENT_AMBULATORY_CARE_PROVIDER_SITE_OTHER): Payer: Medicare Other | Admitting: Cardiovascular Disease

## 2018-08-11 ENCOUNTER — Encounter (HOSPITAL_BASED_OUTPATIENT_CLINIC_OR_DEPARTMENT_OTHER): Payer: Medicare Other | Attending: Internal Medicine

## 2018-08-11 DIAGNOSIS — E1152 Type 2 diabetes mellitus with diabetic peripheral angiopathy with gangrene: Secondary | ICD-10-CM | POA: Diagnosis not present

## 2018-08-11 DIAGNOSIS — I5030 Unspecified diastolic (congestive) heart failure: Secondary | ICD-10-CM | POA: Diagnosis not present

## 2018-08-11 DIAGNOSIS — I480 Paroxysmal atrial fibrillation: Secondary | ICD-10-CM | POA: Diagnosis not present

## 2018-08-11 DIAGNOSIS — I96 Gangrene, not elsewhere classified: Secondary | ICD-10-CM | POA: Diagnosis not present

## 2018-08-11 DIAGNOSIS — L97512 Non-pressure chronic ulcer of other part of right foot with fat layer exposed: Secondary | ICD-10-CM | POA: Insufficient documentation

## 2018-08-11 DIAGNOSIS — I110 Hypertensive heart disease with heart failure: Secondary | ICD-10-CM | POA: Insufficient documentation

## 2018-08-11 DIAGNOSIS — I998 Other disorder of circulatory system: Secondary | ICD-10-CM

## 2018-08-11 DIAGNOSIS — M79671 Pain in right foot: Secondary | ICD-10-CM | POA: Diagnosis not present

## 2018-08-11 DIAGNOSIS — S91101A Unspecified open wound of right great toe without damage to nail, initial encounter: Secondary | ICD-10-CM | POA: Diagnosis not present

## 2018-08-11 DIAGNOSIS — L97412 Non-pressure chronic ulcer of right heel and midfoot with fat layer exposed: Secondary | ICD-10-CM | POA: Diagnosis not present

## 2018-08-11 DIAGNOSIS — I739 Peripheral vascular disease, unspecified: Secondary | ICD-10-CM | POA: Diagnosis not present

## 2018-08-11 DIAGNOSIS — Z95820 Peripheral vascular angioplasty status with implants and grafts: Secondary | ICD-10-CM | POA: Insufficient documentation

## 2018-08-11 DIAGNOSIS — S91301A Unspecified open wound, right foot, initial encounter: Secondary | ICD-10-CM | POA: Diagnosis not present

## 2018-08-11 DIAGNOSIS — I70229 Atherosclerosis of native arteries of extremities with rest pain, unspecified extremity: Secondary | ICD-10-CM

## 2018-08-11 DIAGNOSIS — E11621 Type 2 diabetes mellitus with foot ulcer: Secondary | ICD-10-CM | POA: Insufficient documentation

## 2018-08-11 NOTE — Progress Notes (Signed)
I saw [name] and [name] virtually via video telemedicine visit today in Dr. Janalyn Rouse office.  I recently performed orbital rotational atherectomy of a subtotally occluded calcified right popliteal artery.  His right ABI improved from 0.4-0.6.  He has a gangrenous right great toe.  We have decided to allow Dr. Dellia Nims to evaluate him and treat his toe over the next several weeks.  We will see him back in 1 month, sooner if things deteriorate.  The only procedure left would be to be a pedal access and potential recanalization of a totally occluded calcified right anterior tibial artery.  The patient has 0 vessel runoff below the knee.  Lorretta Harp, M.D., [place], [facility], [place] [address] [place], [place]  [address]  [phone] 08/11/2018 4:42 PM

## 2018-08-11 NOTE — Telephone Encounter (Signed)

## 2018-08-11 NOTE — Telephone Encounter (Addendum)
Patient and/or DPR-approved person aware of 7/28 AVS instructions and verbalized understanding.  AVS to be released to [employer]

## 2018-08-11 NOTE — Patient Instructions (Signed)
Medication Instructions:  Your physician recommends that you continue on your current medications as directed. Please refer to the Current Medication list given to you today.  If you need a refill on your cardiac medications before your next appointment, please call your pharmacy.   Lab work: NONE If you have labs (blood work) drawn today and your tests are completely normal, you will receive your results only by: [name] MyChart Message (if you have MyChart) OR . A paper copy in the mail If you have any lab test that is abnormal or we need to change your treatment, we will call you to review the results.  Testing/Procedures: NONE  Follow-Up: At [facility], you and your health needs are our priority.  As part of our continuing mission to provide you with exceptional heart care, we have created designated Provider Care Teams.  These Care Teams include your primary Cardiologist (physician) and Advanced Practice Providers (APPs -  Physician Assistants and Nurse Practitioners) who all work together to provide you with the care you need, when you need it. You will need a follow up appointment in the office in 4 weeks WITH DR. Gwenlyn Found.

## 2018-08-12 ENCOUNTER — Ambulatory Visit (HOSPITAL_COMMUNITY): Payer: Medicare Other

## 2018-08-12 ENCOUNTER — Inpatient Hospital Stay (HOSPITAL_COMMUNITY): Admission: RE | Admit: 2018-08-12 | Payer: Medicare Other | Source: Ambulatory Visit

## 2018-08-12 DIAGNOSIS — E11621 Type 2 diabetes mellitus with foot ulcer: Secondary | ICD-10-CM | POA: Diagnosis not present

## 2018-08-13 NOTE — Telephone Encounter (Signed)
Pt had appt at [facility] scheduled for Tuesday 08/11/18 at 2:45pm located in building beside [facility]  Guion Lawrence [place]

## 2018-08-14 ENCOUNTER — Ambulatory Visit (HOSPITAL_COMMUNITY): Payer: Medicare Other

## 2018-08-17 ENCOUNTER — Encounter (HOSPITAL_COMMUNITY): Payer: Medicare Other

## 2018-08-17 ENCOUNTER — Ambulatory Visit (HOSPITAL_COMMUNITY): Payer: Medicare Other | Admitting: Physical Therapy

## 2018-08-18 ENCOUNTER — Encounter (HOSPITAL_BASED_OUTPATIENT_CLINIC_OR_DEPARTMENT_OTHER): Payer: Medicare Other | Attending: Internal Medicine

## 2018-08-18 ENCOUNTER — Encounter (HOSPITAL_BASED_OUTPATIENT_CLINIC_OR_DEPARTMENT_OTHER): Payer: Medicare Other

## 2018-08-18 DIAGNOSIS — R600 Localized edema: Secondary | ICD-10-CM | POA: Diagnosis not present

## 2018-08-18 DIAGNOSIS — S91301A Unspecified open wound, right foot, initial encounter: Secondary | ICD-10-CM | POA: Diagnosis not present

## 2018-08-18 DIAGNOSIS — S91101A Unspecified open wound of right great toe without damage to nail, initial encounter: Secondary | ICD-10-CM | POA: Diagnosis not present

## 2018-08-18 DIAGNOSIS — M79674 Pain in right toe(s): Secondary | ICD-10-CM | POA: Diagnosis not present

## 2018-08-19 ENCOUNTER — Ambulatory Visit: Payer: Medicare Other | Admitting: Cardiovascular Disease

## 2018-08-19 ENCOUNTER — Telehealth: Payer: Self-pay

## 2018-08-19 ENCOUNTER — Ambulatory Visit (HOSPITAL_COMMUNITY): Payer: Medicare Other | Admitting: Physical Therapy

## 2018-08-19 NOTE — Telephone Encounter (Signed)
This is [name]' daughter, [name]. I wanted to see how long my dad would have to wait to, possibly, be able to have a procedure in his arteries. I know it is very difficult, but we really want to try. His foot is getting worse by the day and we are very eager to have this procedure done.   Thank you so much!  [phone]

## 2018-08-20 ENCOUNTER — Emergency Department (HOSPITAL_COMMUNITY): Payer: Medicare Other

## 2018-08-20 ENCOUNTER — Other Ambulatory Visit: Payer: Self-pay

## 2018-08-20 ENCOUNTER — Inpatient Hospital Stay (HOSPITAL_COMMUNITY)
Admission: EM | Admit: 2018-08-20 | Discharge: 2018-09-07 | DRG: 239 | Disposition: A | Discharge status: Rehabilitation Facility | Payer: Medicare Other | Attending: Internal Medicine | Admitting: Internal Medicine

## 2018-08-20 ENCOUNTER — Encounter (HOSPITAL_COMMUNITY): Payer: Self-pay | Admitting: Emergency Medicine

## 2018-08-20 DIAGNOSIS — L97519 Non-pressure chronic ulcer of other part of right foot with unspecified severity: Secondary | ICD-10-CM | POA: Diagnosis present

## 2018-08-20 DIAGNOSIS — N179 Acute kidney failure, unspecified: Secondary | ICD-10-CM | POA: Diagnosis present

## 2018-08-20 DIAGNOSIS — Z515 Encounter for palliative care: Secondary | ICD-10-CM

## 2018-08-20 DIAGNOSIS — D62 Acute posthemorrhagic anemia: Secondary | ICD-10-CM | POA: Diagnosis not present

## 2018-08-20 DIAGNOSIS — E1165 Type 2 diabetes mellitus with hyperglycemia: Secondary | ICD-10-CM | POA: Diagnosis present

## 2018-08-20 DIAGNOSIS — L03031 Cellulitis of right toe: Secondary | ICD-10-CM | POA: Diagnosis not present

## 2018-08-20 DIAGNOSIS — M7989 Other specified soft tissue disorders: Secondary | ICD-10-CM | POA: Diagnosis not present

## 2018-08-20 DIAGNOSIS — N183 Chronic kidney disease, stage 3 (moderate): Secondary | ICD-10-CM | POA: Diagnosis present

## 2018-08-20 DIAGNOSIS — R195 Other fecal abnormalities: Secondary | ICD-10-CM | POA: Diagnosis not present

## 2018-08-20 DIAGNOSIS — I482 Chronic atrial fibrillation, unspecified: Secondary | ICD-10-CM | POA: Diagnosis present

## 2018-08-20 DIAGNOSIS — I998 Other disorder of circulatory system: Secondary | ICD-10-CM | POA: Diagnosis not present

## 2018-08-20 DIAGNOSIS — I10 Essential (primary) hypertension: Secondary | ICD-10-CM | POA: Diagnosis present

## 2018-08-20 DIAGNOSIS — Z20828 Contact with and (suspected) exposure to other viral communicable diseases: Secondary | ICD-10-CM | POA: Diagnosis present

## 2018-08-20 DIAGNOSIS — R5381 Other malaise: Secondary | ICD-10-CM | POA: Diagnosis present

## 2018-08-20 DIAGNOSIS — G4733 Obstructive sleep apnea (adult) (pediatric): Secondary | ICD-10-CM | POA: Diagnosis present

## 2018-08-20 DIAGNOSIS — I70261 Atherosclerosis of native arteries of extremities with gangrene, right leg: Secondary | ICD-10-CM | POA: Diagnosis present

## 2018-08-20 DIAGNOSIS — M79674 Pain in right toe(s): Secondary | ICD-10-CM | POA: Diagnosis not present

## 2018-08-20 DIAGNOSIS — H409 Unspecified glaucoma: Secondary | ICD-10-CM | POA: Diagnosis present

## 2018-08-20 DIAGNOSIS — I739 Peripheral vascular disease, unspecified: Secondary | ICD-10-CM

## 2018-08-20 DIAGNOSIS — L03115 Cellulitis of right lower limb: Secondary | ICD-10-CM | POA: Diagnosis present

## 2018-08-20 DIAGNOSIS — E785 Hyperlipidemia, unspecified: Secondary | ICD-10-CM | POA: Diagnosis present

## 2018-08-20 DIAGNOSIS — L97509 Non-pressure chronic ulcer of other part of unspecified foot with unspecified severity: Secondary | ICD-10-CM | POA: Diagnosis not present

## 2018-08-20 DIAGNOSIS — I96 Gangrene, not elsewhere classified: Secondary | ICD-10-CM

## 2018-08-20 DIAGNOSIS — E611 Iron deficiency: Secondary | ICD-10-CM | POA: Diagnosis present

## 2018-08-20 DIAGNOSIS — E1142 Type 2 diabetes mellitus with diabetic polyneuropathy: Secondary | ICD-10-CM | POA: Diagnosis present

## 2018-08-20 DIAGNOSIS — I350 Nonrheumatic aortic (valve) stenosis: Secondary | ICD-10-CM | POA: Diagnosis present

## 2018-08-20 DIAGNOSIS — N400 Enlarged prostate without lower urinary tract symptoms: Secondary | ICD-10-CM | POA: Diagnosis present

## 2018-08-20 DIAGNOSIS — Z9861 Coronary angioplasty status: Secondary | ICD-10-CM

## 2018-08-20 DIAGNOSIS — Z79890 Hormone replacement therapy (postmenopausal): Secondary | ICD-10-CM

## 2018-08-20 DIAGNOSIS — G92 Toxic encephalopathy: Secondary | ICD-10-CM | POA: Diagnosis not present

## 2018-08-20 DIAGNOSIS — E039 Hypothyroidism, unspecified: Secondary | ICD-10-CM | POA: Diagnosis present

## 2018-08-20 DIAGNOSIS — Z7189 Other specified counseling: Secondary | ICD-10-CM

## 2018-08-20 DIAGNOSIS — E1122 Type 2 diabetes mellitus with diabetic chronic kidney disease: Secondary | ICD-10-CM | POA: Diagnosis present

## 2018-08-20 DIAGNOSIS — Z7982 Long term (current) use of aspirin: Secondary | ICD-10-CM

## 2018-08-20 DIAGNOSIS — Z794 Long term (current) use of insulin: Secondary | ICD-10-CM

## 2018-08-20 DIAGNOSIS — E1152 Type 2 diabetes mellitus with diabetic peripheral angiopathy with gangrene: Principal | ICD-10-CM | POA: Diagnosis present

## 2018-08-20 DIAGNOSIS — L97419 Non-pressure chronic ulcer of right heel and midfoot with unspecified severity: Secondary | ICD-10-CM | POA: Diagnosis present

## 2018-08-20 DIAGNOSIS — Z8249 Family history of ischemic heart disease and other diseases of the circulatory system: Secondary | ICD-10-CM

## 2018-08-20 DIAGNOSIS — E871 Hypo-osmolality and hyponatremia: Secondary | ICD-10-CM | POA: Diagnosis not present

## 2018-08-20 DIAGNOSIS — E11621 Type 2 diabetes mellitus with foot ulcer: Secondary | ICD-10-CM | POA: Diagnosis present

## 2018-08-20 DIAGNOSIS — Z7902 Long term (current) use of antithrombotics/antiplatelets: Secondary | ICD-10-CM

## 2018-08-20 DIAGNOSIS — I129 Hypertensive chronic kidney disease with stage 1 through stage 4 chronic kidney disease, or unspecified chronic kidney disease: Secondary | ICD-10-CM | POA: Diagnosis present

## 2018-08-20 DIAGNOSIS — Z95820 Peripheral vascular angioplasty status with implants and grafts: Secondary | ICD-10-CM

## 2018-08-20 DIAGNOSIS — Z823 Family history of stroke: Secondary | ICD-10-CM

## 2018-08-20 DIAGNOSIS — Z91041 Radiographic dye allergy status: Secondary | ICD-10-CM

## 2018-08-20 DIAGNOSIS — Z89511 Acquired absence of right leg below knee: Secondary | ICD-10-CM

## 2018-08-20 DIAGNOSIS — I480 Paroxysmal atrial fibrillation: Secondary | ICD-10-CM | POA: Diagnosis present

## 2018-08-20 DIAGNOSIS — E43 Unspecified severe protein-calorie malnutrition: Secondary | ICD-10-CM | POA: Diagnosis present

## 2018-08-20 DIAGNOSIS — Z7901 Long term (current) use of anticoagulants: Secondary | ICD-10-CM

## 2018-08-20 DIAGNOSIS — E8770 Fluid overload, unspecified: Secondary | ICD-10-CM | POA: Diagnosis not present

## 2018-08-20 DIAGNOSIS — K5900 Constipation, unspecified: Secondary | ICD-10-CM | POA: Diagnosis present

## 2018-08-20 DIAGNOSIS — E1159 Type 2 diabetes mellitus with other circulatory complications: Secondary | ICD-10-CM

## 2018-08-20 LAB — CBC WITH DIFFERENTIAL/PLATELET
Abs Immature Granulocytes: 0.08 10*3/uL — ABNORMAL HIGH (ref 0.00–0.07)
Basophils Absolute: 0 10*3/uL (ref 0.0–0.1)
Basophils Relative: 0 %
Eosinophils Absolute: 0.2 10*3/uL (ref 0.0–0.5)
Eosinophils Relative: 2 %
HCT: 25.5 % — ABNORMAL LOW (ref 39.0–52.0)
Hemoglobin: 8.7 g/dL — ABNORMAL LOW (ref 13.0–17.0)
Immature Granulocytes: 1 %
Lymphocytes Relative: 16 %
Lymphs Abs: 2.2 10*3/uL (ref 0.7–4.0)
MCH: 31.6 pg (ref 26.0–34.0)
MCHC: 34.1 g/dL (ref 30.0–36.0)
MCV: 92.7 fL (ref 80.0–100.0)
Monocytes Absolute: 0.9 10*3/uL (ref 0.1–1.0)
Monocytes Relative: 7 %
Neutro Abs: 9.9 10*3/uL — ABNORMAL HIGH (ref 1.7–7.7)
Neutrophils Relative %: 74 %
Platelets: 372 10*3/uL (ref 150–400)
RBC: 2.75 MIL/uL — ABNORMAL LOW (ref 4.22–5.81)
RDW: 13.4 % (ref 11.5–15.5)
WBC: 13.3 10*3/uL — ABNORMAL HIGH (ref 4.0–10.5)
nRBC: 0 % (ref 0.0–0.2)

## 2018-08-20 LAB — COMPREHENSIVE METABOLIC PANEL
ALT: 139 U/L — ABNORMAL HIGH (ref 0–44)
AST: 117 U/L — ABNORMAL HIGH (ref 15–41)
Albumin: 2.3 g/dL — ABNORMAL LOW (ref 3.5–5.0)
Alkaline Phosphatase: 82 U/L (ref 38–126)
Anion gap: 12 (ref 5–15)
BUN: 21 mg/dL (ref 8–23)
CO2: 24 mmol/L (ref 22–32)
Calcium: 8.3 mg/dL — ABNORMAL LOW (ref 8.9–10.3)
Chloride: 88 mmol/L — ABNORMAL LOW (ref 98–111)
Creatinine, Ser: 1.41 mg/dL — ABNORMAL HIGH (ref 0.61–1.24)
GFR calc Af Amer: 51 mL/min — ABNORMAL LOW (ref >60)
GFR calc non Af Amer: 44 mL/min — ABNORMAL LOW (ref >60)
Glucose, Bld: 157 mg/dL — ABNORMAL HIGH (ref 70–99)
Potassium: 3.7 mmol/L (ref 3.5–5.1)
Sodium: 124 mmol/L — ABNORMAL LOW (ref 135–145)
Total Bilirubin: 0.7 mg/dL (ref 0.3–1.2)
Total Protein: 6.2 g/dL — ABNORMAL LOW (ref 6.5–8.1)

## 2018-08-20 LAB — URINALYSIS, ROUTINE W REFLEX MICROSCOPIC
Bilirubin Urine: NEGATIVE
Glucose, UA: NEGATIVE mg/dL
Hgb urine dipstick: NEGATIVE
Ketones, ur: NEGATIVE mg/dL
Leukocytes,Ua: NEGATIVE
Nitrite: NEGATIVE
Protein, ur: NEGATIVE mg/dL
Specific Gravity, Urine: 1.01 (ref 1.005–1.030)
pH: 5 (ref 5.0–8.0)

## 2018-08-20 LAB — LACTIC ACID, PLASMA
Lactic Acid, Venous: 1.7 mmol/L (ref 0.5–1.9)
Lactic Acid, Venous: 1.8 mmol/L (ref 0.5–1.9)

## 2018-08-20 LAB — POC OCCULT BLOOD, ED: Fecal Occult Bld: POSITIVE — AB

## 2018-08-20 LAB — SARS CORONAVIRUS 2 BY RT PCR (HOSPITAL ORDER, PERFORMED IN ~~LOC~~ HOSPITAL LAB): SARS Coronavirus 2: NEGATIVE

## 2018-08-20 MED ORDER — VANCOMYCIN HCL 10 G IV SOLR
1250.0000 mg | INTRAVENOUS | Status: DC
  Filled 2018-08-20: qty 1250

## 2018-08-20 MED ORDER — PIPERACILLIN-TAZOBACTAM 3.375 G IVPB
3.3750 g | Freq: Three times a day (TID) | INTRAVENOUS | Status: DC
  Administered 2018-08-21 – 2018-08-22 (×4): 3.375 g via INTRAVENOUS
  Filled 2018-08-20 (×4): qty 50

## 2018-08-20 MED ORDER — VANCOMYCIN HCL 10 G IV SOLR
2000.0000 mg | Freq: Once | INTRAVENOUS | Status: AC
  Administered 2018-08-20: 2000 mg via INTRAVENOUS
  Filled 2018-08-20: qty 2000

## 2018-08-20 MED ORDER — PIPERACILLIN-TAZOBACTAM 3.375 G IVPB
3.3750 g | Freq: Once | INTRAVENOUS | Status: AC
  Administered 2018-08-20: 3.375 g via INTRAVENOUS
  Filled 2018-08-20: qty 50

## 2018-08-20 MED ORDER — SODIUM CHLORIDE 0.9 % IV BOLUS
500.0000 mL | Freq: Once | INTRAVENOUS | Status: AC
  Administered 2018-08-20: 500 mL via INTRAVENOUS

## 2018-08-20 MED ORDER — HEPARIN (PORCINE) 25000 UT/250ML-% IV SOLN
1200.0000 [IU]/h | INTRAVENOUS | Status: DC
  Administered 2018-08-21: 1400 [IU]/h via INTRAVENOUS
  Filled 2018-08-20: qty 250

## 2018-08-20 NOTE — Progress Notes (Signed)
Pharmacy Antibiotic Note  [name] is a 85 y.o. male admitted on 08/20/2018 with cellulitis. Gangrenous ulcer on right great toe and other ulcer spreading on right lower extremity. Hx of critical limb ischemia s/p stenting of SFA. Pharmacy has been consulted for vancomycin dosing.  Afebrile, Tmax 97.7. WBC elevated 13.3. Scr 1.41, slightly elevated from baseline ~1.25.  Plan: Vancomycin 2000 mg IV loading dose Vancomycin 1250 mg IV Q 24 hrs. Goal AUC 400-550. Expected AUC: 475.7 SCr used: 1.41 Monitor cxs, renal fxn, clinical improvement, and abx de-escalation as needed     Temp (24hrs), Avg:97.6 F (36.4 C), Min:97.5 F (36.4 C), Max:97.7 F (36.5 C)  Recent Labs  Lab 08/20/18 1257 08/20/18 1735  WBC 13.3*  --   CREATININE 1.41*  --   LATICACIDVEN 1.7 1.8    Estimated Creatinine Clearance: 43.8 mL/min (A) (by C-G formula based on SCr of 1.41 mg/dL (H)).    Allergies  Allergen Reactions  . Iodinated Diagnostic Agents     Other reaction(s): Fever  . Iodine-131 Other (See Comments)    Breakout, fever  . Iohexol      Code: RASH, Desc: PT STATES HOT ALL OVER HAD TO HAVE ICE PACKS ALL OVER HIM AND DIFF BREATHING, PT REFUSED IV DYE     Antimicrobials this admission: 8/6 Zosyn >> 8/6 Vanc >>  Microbiology results: 8/6 BCx: sent  Thank you for allowing pharmacy to be a part of this patient's care.  Mirian Capuchin, 08/20/2018 9:03 PM

## 2018-08-20 NOTE — Progress Notes (Signed)
Pharmacy Antibiotic Note  [name] is a 85 y.o. male admitted on 08/20/2018 with wound infection.  Pharmacy has been consulted for pip-tazo dosing.  Pt is afebrile, Tmax 97.7. WBC elevated 13.3. Scr 1.41, slightly elevated from baseline ~1.25.  Plan: Pip-tazo 3.375 gm IV q8h Monitor cxs, renal fxn, clinical improvement    Temp (24hrs), Avg:97.7 F (36.5 C), Min:97.7 F (36.5 C), Max:97.7 F (36.5 C)  Recent Labs  Lab 08/20/18 1257 08/20/18 1735  WBC 13.3*  --   CREATININE 1.41*  --   LATICACIDVEN 1.7 1.8    Estimated Creatinine Clearance: 43.8 mL/min (A) (by C-G formula based on SCr of 1.41 mg/dL (H)).    Allergies  Allergen Reactions  . Iodinated Diagnostic Agents     Other reaction(s): Fever  . Iodine-131 Other (See Comments)    Breakout, fever  . Iohexol      Code: RASH, Desc: PT STATES HOT ALL OVER HAD TO HAVE ICE PACKS ALL OVER HIM AND DIFF BREATHING, PT REFUSED IV DYE     Antimicrobials this admission: 8/6 Zosyn >>  Microbiology results: 8/6 BCx: sent  Thank you for allowing pharmacy to be a part of this patient's care.  Berenice Bouton, PharmD PGY1 Pharmacy Resident Office phone: [phone]  08/20/2018 6:33 PM

## 2018-08-20 NOTE — ED Provider Notes (Signed)
[facility] EMERGENCY DEPARTMENT Provider Note   CSN: [id number] Arrival date & time: 08/20/18  1223     History   Chief Complaint Chief Complaint  Patient presents with  . Foot Pain  . Foot Swelling  . Post-Op Peripheral Artery bypass    HPI [name] is a 85 y.o. male.     HPI   Patient is an 85 year old male with a past medical history of peripheral vascular disease with critical right lower extremity ischemia status post stenting of SFA, hyperlipidemia, hypertension, atrial fibrillation on Eliquis, type 2 diabetes mellitus not on insulin presenting for increasing erythema, swelling of the right lower extremity.  Patient has had ongoing issues with gangrenous ulcer of the right great toe as well as other ulcer spreading on his right lower extremity.  These have been followed by wound care.  He is also had multiple vascular interventions by cardiology.  Patient's daughter reports that the patient has had decreased appetite and increased weakness over the last week.  No fevers at home.  Patient denies any chest pain, shortness of breath, nausea, vomiting, or abdominal pain.  Recent interventions per Dr. Kennon Holter note:   "He underwentperipheral angiography on 07/27/2018 via the left femoral approach revealing a patent mid to distal left SFA stent, high-grade calcified disease beyond that and 0 vessel runoff on the left as well as on the right. Also had a 99% calcified lesion in the distal right SFA/above-knee popliteal artery.Due to contrast load he was scheduled forstagedintervention for limb salvage. [facility] was[facility]to [facility]. On 08/03/2018 the patient had successful diamondback orbital rotational atherectomy, chocolate balloon angioplasty followed by drug-coated balloon angioplasty of a highly calcified segmental distal right SFA, above the knee popliteal artery CTO in the setting of critical limb ischemia with an occluded  distal vessels and 0 vessel runoff. There was very sluggish flow suggesting potential for "distal embolization. Vasodilators were administered."   Past Medical History:  Diagnosis Date  . Arthritis   . Borderline diabetic   . Glaucoma   . Hyperlipidemia   . Hypertension   . PVD (peripheral vascular disease) ([place])   . Sleep apnea    Cpap ordered but doesnt use    Patient Active Problem List   Diagnosis Date Noted  . Critical lower limb ischemia 08/03/2018  . Type 2 diabetes mellitus with foot ulcer, without long-term current use of insulin ([place])   . Gastroesophageal reflux disease   . Cellulitis of great toe of right foot 05/29/2018  . Atrial fibrillation, chronic   . Chest pain 07/25/2017  . PAF (paroxysmal atrial fibrillation) ([place]) 07/25/2017  . BPH (benign prostatic hyperplasia) 07/25/2017  . Shortness of breath 05/11/2017  . Hypothyroidism 05/11/2017  . Chronic diastolic CHF (congestive heart failure) ([place]) 05/11/2017  . Aortic valve stenosis 06/12/2016  . RBBB 06/12/2016  . PAD (peripheral artery disease) ([place]) 08/04/2012  . Essential hypertension 08/04/2012  . Hyperlipidemia 08/04/2012  . Dizziness 08/04/2012    Past Surgical History:  Procedure Laterality Date  . ABDOMINAL AORTOGRAM W/LOWER EXTREMITY N/A 07/27/2018   Procedure: ABDOMINAL AORTOGRAM W/LOWER EXTREMITY;  Surgeon: Lorretta Harp, MD;  Location: [facility];  Service: Cardiovascular;  Laterality: N/A;  . APPENDECTOMY  1943  . CATARACT EXTRACTION  2012   x2  . COLONOSCOPY N/A 11/01/2014   Procedure: COLONOSCOPY;  Surgeon: Aviva Signs, MD;  [facility];  Service: Gastroenterology;  Laterality: N/A;  . ESOPHAGOGASTRODUODENOSCOPY  N/A 11/01/2014   Procedure: ESOPHAGOGASTRODUODENOSCOPY (EGD);  Surgeon: Aviva Signs, MD;  Location: [facility];  Service: Gastroenterology;  Laterality: N/A;  . HERNIA REPAIR Left   . INGUINAL HERNIA REPAIR Right 03/01/2013   Procedure: RIGHT INGUINAL  HERNIORRHAPHY;  Surgeon: Jamesetta So, MD;  Location: [facility];  Service: General;  Laterality: Right;  . INSERTION OF MESH Right 03/01/2013   Procedure: INSERTION OF MESH;  Surgeon: Jamesetta So, MD;  Location: [facility];  Service: General;  Laterality: Right;  . NM MYOCAR PERF WALL MOTION  06/05/2010   no significant ischemia  . PERIPHERAL VASCULAR ATHERECTOMY Right 08/03/2018   Procedure: PERIPHERAL VASCULAR ATHERECTOMY;  Surgeon: Lorretta Harp, MD;  Location: [facility];  Service: Cardiovascular;  Laterality: Right;  . stent  06/14/2010   left leg  . US ECHOCARDIOGRAPHY  01/21/2006   moderate mitral annular ca+, mild MR, AOV moderately sclerotic.        Home Medications    Prior to Admission medications   Medication Sig Start Date End Date Taking? Authorizing Provider  apixaban (ELIQUIS) 2.5 MG TABS tablet Take 1 tablet (2.5 mg total) by mouth 2 (two) times daily. 08/11/17   Pixie Casino, MD  aspirin EC 81 MG EC tablet Take 1 tablet (81 mg total) by mouth daily. Stop taking aspirin 09/04/2018. 08/05/18 09/04/18  Kroeger, Daleen Snook M., PA-C  atorvastatin (LIPITOR) 40 MG tablet TAKE 1 TABLET BY MOUTH ONCE DAILY Patient taking differently: Take 40 mg by mouth daily after supper.  02/17/18   Hilty, Nadean Corwin, MD  calcium carbonate (TUMS - DOSED IN MG ELEMENTAL CALCIUM) 500 MG chewable tablet Chew 1 tablet by mouth as needed for indigestion or heartburn.    [provider]  clopidogrel (PLAVIX) 75 MG tablet Take 1 tablet (75 mg total) by mouth daily with breakfast. 08/05/18   Kroeger, Daleen Snook M., PA-C  COSAMIN DS 500-400 MG tablet Take 1 tablet by mouth 2 (two) times a day.    [provider]  diphenhydrAMINE (BENADRYL) 50 MG tablet PLEASE FOLLOW THE INSTRUCTIONS ON YOUR AFTER VISIT SUMMARY 07/31/18   Lorretta Harp, MD  docusate sodium (COLACE) 100 MG capsule Take 100 mg by mouth 2 (two) times daily as needed (for use with oxycodone to prevent constipation.).     [provider]  dorzolamide-timolol (COSOPT) 22.3-6.8 MG/ML ophthalmic solution Place 1 drop into both eyes 2 (two) times daily.    [provider]  doxazosin (CARDURA) 2 MG tablet Take 1 tablet (2 mg total) by mouth at bedtime. 07/08/18   Kroeger, Lorelee Cover., PA-C  finasteride (PROSCAR) 5 MG tablet Take 5 mg by mouth every evening.     [provider]  furosemide (LASIX) 40 MG tablet TAKE 1 TABLET(40 MG) BY MOUTH DAILY Patient taking differently: Take 40 mg by mouth daily. TAKE 1 TABLET(40 MG) BY MOUTH DAILY 07/08/18   Kroeger, Daleen Snook M., PA-C  glipiZIDE (GLUCOTROL) 5 MG tablet Take 5 mg by mouth 2 (two) times daily.     [provider]  isosorbide mononitrate (IMDUR) 30 MG 24 hr tablet Take 0.5 tablets (15 mg total) by mouth daily. 07/27/18   Kroeger, Lorelee Cover., PA-C  levothyroxine (SYNTHROID, LEVOTHROID) 50 MCG tablet Take 50 mcg by mouth daily before breakfast.  07/08/16   [provider]  metoprolol tartrate (LOPRESSOR) 25 MG tablet Take 12.5 mg as needed for palpitations/heart racing Patient taking differently: Take 12.5 mg by mouth daily as needed (palpitations/heart  racing). Take 12.5 mg as needed for palpitations/heart racin 07/26/17   Barton Dubois, MD  Omega-3 Fatty Acids (FISH OIL PO) Take 1 capsule by mouth daily.    [provider]  oxyCODONE-acetaminophen (PERCOCET) 5-325 MG tablet Take 1-2 tablets by mouth every 8 (eight) hours as needed for severe pain. 07/09/18   Mesner, Corene Cornea, MD  oxyCODONE-acetaminophen (PERCOCET) 5-325 MG tablet Take 1 tablet by mouth every 8 (eight) hours as needed for severe pain. 08/07/18   Erlene Quan, PA-C  pyridOXINE (VITAMIN B-6) 100 MG tablet Take 100 mg by mouth daily.    [provider]  ranitidine (ZANTAC) 150 MG tablet Take 150 mg by mouth at bedtime.    [provider]  tamsulosin (FLOMAX) 0.4 MG CAPS Take 0.4 mg by mouth 2 (two) times daily.     [provider]  vitamin C  (ASCORBIC ACID) 500 MG tablet Take 500 mg by mouth daily.    [provider]  vitamin E 400 UNIT capsule Take 400 Units by mouth daily.    [provider]  Zinc 50 MG TABS Take 50 mg by mouth every other day.    [provider]    Family History Family History  Problem Relation Age of Onset  . Cancer Mother   . Heart attack Father   . Stroke Father   . Heart attack Brother   . Heart attack Brother     Social History Social History   Tobacco Use  . Smoking status: Former Smoker    Quit date: 01/14/1935    Years since quitting: 83.6  . Smokeless tobacco: Former Systems developer    Types: Chew  Substance Use Topics  . Alcohol use: No  . Drug use: No     Allergies   Iodinated diagnostic agents, Iodine-131, and Iohexol   Review of Systems Review of Systems  Constitutional: Positive for appetite change. Negative for chills and fever.  HENT: Negative for congestion and rhinorrhea.   Cardiovascular: Positive for leg swelling.  Gastrointestinal: Negative for abdominal pain, nausea and vomiting.  Skin: Positive for color change and wound.  Neurological: Positive for weakness.       +Generalized weakness  All other systems reviewed and are negative.      Physical Exam Updated Vital Signs BP (!) 119/59 (BP Location: Right Arm)   Pulse 72   Temp 97.7 F (36.5 C) (Oral)   Resp 20   SpO2 100%   Physical Exam Vitals signs and nursing note reviewed.  Constitutional:      General: He is not in acute distress.    Appearance: He is well-developed. He is not ill-appearing or diaphoretic.  HENT:     Head: Normocephalic and atraumatic.  Eyes:     Conjunctiva/sclera: Conjunctivae normal.     Pupils: Pupils are equal, round, and reactive to light.  Neck:     Musculoskeletal: Normal range of motion and neck supple.  Cardiovascular:     Rate and Rhythm: Normal rate and regular rhythm.     Heart sounds: S1 normal and S2 normal. Murmur present.  Pulmonary:      Effort: Pulmonary effort is normal.     Breath sounds: Normal breath sounds. No wheezing or rales.  Abdominal:     General: There is no distension.     Palpations: Abdomen is soft.     Tenderness: There is no abdominal tenderness. There is no guarding.  Musculoskeletal: Normal range of motion.  General: Deformity and signs of injury present.     Right lower leg: Edema present.     Left lower leg: Edema present.     Comments: See clinical photo for details.  Patient has gangrenous right great toe.  There is erythema spreading up the right foot and approximately one third of the area of the distal tibia.  There is pitting edema of the right foot.  PT pulse was obtained with Doppler, demonstrating turbulent flow.  Lymphadenopathy:     Cervical: No cervical adenopathy.  Skin:    General: Skin is warm and dry.     Findings: No erythema or rash.  Neurological:     Mental Status: He is alert.     Comments: Cranial nerves grossly intact. Patient moves extremities symmetrically and with good coordination.  Psychiatric:        Behavior: Behavior normal.        Thought Content: Thought content normal.        Judgment: Judgment normal.           ED Treatments / Results  Labs (all labs ordered are listed, but only abnormal results are displayed) Labs Reviewed  CBC WITH DIFFERENTIAL/PLATELET - Abnormal; Notable for the following components:      Result Value   WBC 13.3 (*)    RBC 2.75 (*)    Hemoglobin 8.7 (*)    HCT 25.5 (*)    Neutro Abs 9.9 (*)    Abs Immature Granulocytes 0.08 (*)    All other components within normal limits  COMPREHENSIVE METABOLIC PANEL - Abnormal; Notable for the following components:   Sodium 124 (*)    Chloride 88 (*)    Glucose, Bld 157 (*)    Creatinine, Ser 1.41 (*)    Calcium 8.3 (*)    Total Protein 6.2 (*)    Albumin 2.3 (*)    AST 117 (*)    ALT 139 (*)    GFR calc non Af Amer 44 (*)    GFR calc Af Amer 51 (*)    All other  components within normal limits  POC OCCULT BLOOD, ED - Abnormal; Notable for the following components:   Fecal Occult Bld POSITIVE (*)    All other components within normal limits  SARS CORONAVIRUS 2 (HOSPITAL ORDER, [facility])  CULTURE, BLOOD (ROUTINE X 2)  CULTURE, BLOOD (ROUTINE X 2)  LACTIC ACID, PLASMA  LACTIC ACID, PLASMA  URINALYSIS, ROUTINE W REFLEX MICROSCOPIC  CREATININE, SERUM  HEPARIN LEVEL (UNFRACTIONATED)  CBC  APTT    EKG EKG Interpretation  Date/Time:  Thursday August 20 2018 18:57:22 EDT Ventricular Rate:  69 PR Interval:    QRS Duration: 148 QT Interval:  449 QTC Calculation: 481 R Axis:   84 Text Interpretation:  Unknown rhythm, irregular rate Right bundle branch block Confirmed by Malvin Johns [phone]) on 08/20/2018 7:00:04 PM   Radiology Dg Foot Complete Right  Result Date: 08/20/2018 CLINICAL DATA:  Great toe pain and swelling. EXAM: RIGHT FOOT COMPLETE - 3+ VIEW COMPARISON:  05/29/2018 MRI. FINDINGS: The bony structures are intact. I do not see any definite destructive bony changes to suggest osteomyelitis. No definite findings for septic arthritis. Extensive vascular calcifications are noted. IMPRESSION: No acute bony findings or destructive bony changes. Electronically Signed   By: Marijo Sanes M.D.   On: 08/20/2018 19:13    Procedures Procedures (including critical care time)  Medications Ordered in ED Medications - No data to  display   Initial Impression / Assessment and Plan / ED Course  I have reviewed the triage vital signs and the nursing notes.  Pertinent labs & imaging results that were available during my care of the patient were reviewed by me and considered in my medical decision making (see chart for details).  Clinical Course as of Aug 20 2239  Thu Aug 20, 2018  1711 1.3 gram drop in 2 weeks.  Hemoglobin(!): 8.7 [AM]  1712 Drop from 132 2 weeks ago.  Sodium(!): 124 [AM]  62 Spoke with Dr. Sallyanne Kuster  who will discuss patient case with Dr. Gwenlyn Found. Appreciate his involvement.    [AM]  1943 Spoke with vascular surgery who states that patient should follow with cardiology.   [AM]  2044 Spoke with Dr. Marlou Sa of orthopedics who will follow patient. They will consult. Appreciate his involvement.    [AM]    Clinical Course User Index [AM] Albesa Seen, PA-C       This is an 85 year old male with a past medical history of peripheral vascular disease with critical right lower extremity ischemia status post stenting of SFA, hyperlipidemia, hypertension, atrial fibrillation on Eliquis, type 2 diabetes mellitus not on insulin presenting for increasing erythema, swelling of the right lower extremity.  Clinically suggestive of cellulitis secondary to gangrenous foot.  Work-up demonstrating leukocytosis of 13.3.  With left shift.  Patient does have a 1.3 g drop in hemoglobin in 2 weeks.  Rectal exam was performed without melena or hematochezia.  He was fecal occult positive.  Additional work-up showing hyponatremia of 124 with a baseline around 1 27-1 30.  Slight elevation in creatinine suggestive of AKI.  Radiograph of the right foot demonstrating no destructive lesions suggestive of osteomyelitis.  Patient given soft fluid hydration and Zosyn here in the emergency department for PVD associated cellulitis.  Cardiology consulted as they were the previous team to do his SFA stenting.  Orthopedics also consulted and they will evaluate the patient.  Appreciate the involvement of these teams.  Patient to be admitted to tried hospitalist for ongoing management.  This is a shared visit with Dr. Malvin Johns. Patient was independently evaluated by this attending physician. Attending physician consulted in evaluation and management.  Final Clinical Impressions(s) / ED Diagnoses   Final diagnoses:  Cellulitis of right lower extremity  Severe peripheral arterial disease ([place])  AKI (acute kidney injury) ([place])   Occult blood positive stool    ED Discharge Orders    None       Tamala Julian 08/20/18 2245    Malvin Johns, MD 08/21/18 1112

## 2018-08-20 NOTE — ED Triage Notes (Addendum)
Pt with redness and swelling to the right lower leg and foot. He reports his toes are dark. The foot is wrapped with gauze and in a surgical boot. Blisters noted.  Denies fevers or shortness of breath. Hx of peripheral vascular diease and occluded calcified right anterior tibial artery.

## 2018-08-20 NOTE — Progress Notes (Addendum)
ANTICOAGULATION CONSULT NOTE - Initial Consult  Pharmacy Consult for heparin Indication: atrial fibrillation  Allergies  Allergen Reactions  . Iodinated Diagnostic Agents     Other reaction(s): Fever  . Iodine-131 Other (See Comments)    Breakout, fever  . Iohexol      Code: RASH, Desc: PT STATES HOT ALL OVER HAD TO HAVE ICE PACKS ALL OVER HIM AND DIFF BREATHING, PT REFUSED IV DYE     Patient Measurements:   Heparin Dosing Weight: 96.6 kg  Vital Signs: Temp: 97.5 F (36.4 C) (08/06 1843) Temp Source: Rectal (08/06 1843) BP: 139/63 (08/06 1838) Pulse Rate: 74 (08/06 1838)  Labs: Recent Labs    08/20/18 1257  HGB 8.7*  HCT 25.5*  PLT 372  CREATININE 1.41*    Estimated Creatinine Clearance: 43.8 mL/min (A) (by C-G formula based on SCr of 1.41 mg/dL (H)).   Medical History: Past Medical History:  Diagnosis Date  . Arthritis   . Borderline diabetic   . Glaucoma   . Hyperlipidemia   . Hypertension   . PVD (peripheral vascular disease) ([place])   . Sleep apnea    Cpap ordered but doesnt use    Medications:  Scheduled:   Infusions:  . sodium chloride    . piperacillin-tazobactam (ZOSYN)  IV 3.375 g (08/20/18 1840)  . [START ON 08/21/2018] piperacillin-tazobactam (ZOSYN)  IV      Assessment: 24 yoM admitted for ischemic foot. On apixaban 2.5 mg BID for Afib PTA. Last dose taken 8/6 @0930 . Pharmacy consulted to dose heparin.  CBC low, Hgb 8.7, Plt 372. No bleeding noted. CrCl >30 ml/min, okay to begin heparin infusion at least 12 hours after last dose with no bolus.  Goal of Therapy:  Heparin level 0.3-0.7 units/ml Monitor platelets by anticoagulation protocol: Yes   Plan:  No heparin bolus Heparin infusion at 1400 units/hr Heparin level and aPTT in 8 hours Monitor daily heparin levels, aPTT, CBC, and s/sx of bleeding  Thank you for involving pharmacy in this patient's care.  Berenice Bouton, PharmD PGY1 Pharmacy Resident Office phone:  [phone]  08/20/2018,8:46 PM

## 2018-08-20 NOTE — H&P (Signed)
History and Physical    [name] AYT:[id number] DOB: [birth year] DOA: 08/20/2018  PCP: [name], MD  Patient coming from: Home.  Chief Complaint: Increasing leg pain and redness.  HPI: [name] is a 85 y.o. male with history of peripheral vascular disease who has recently underwent right lower extremity intervention for peripheral vascular disease presents to the ER because of worsening pain and redness of the right lower extremity.  Patient has been having a gangrenous toe which has been progressing getting worse over the last 1 month.  It has been progressing involving the whole toe now and progressing to the other part of the right foot.  Patient also noticed increasing redness involving the foot which has progressed to distal right leg.  Over the last 2 weeks patient is not able ambulate because of the changes seen.  Denies any chest pain shortness of breath fever chills.  ED Course: In the ER on exam patient has poor pulses in the extremities.  X-ray of the right foot does not show anything acute.  On exam patient has gangrenous looking right great toe and also some darkish areas on the plantar aspect of the foot.  Erythema involving the foot and the right distal part of the leg.  ER physician had discussed with on-call cardiologist since patient had procedure done by cardiologist 2 weeks ago.  They will be seeing patient in consult.  Also discussed with Dr. Marlou Sa on-call orthopedic surgeon.  Patient admitted for further management of cellulitis and gangrene of the right foot.  On antibiotics.  Labs show worsening anemia for which stool for occult blood was positive.  Patient has not noticed any obvious bleeding.  Other labs include sodium 124 creatinine 1.4 was given some fluid in the ER.  Review of Systems: As per HPI, rest all negative.   Past Medical History:  Diagnosis Date  . Arthritis   . Borderline diabetic   . Glaucoma   . Hyperlipidemia   . Hypertension   . PVD  (peripheral vascular disease) ([place])   . Sleep apnea    Cpap ordered but doesnt use    Past Surgical History:  Procedure Laterality Date  . ABDOMINAL AORTOGRAM W/LOWER EXTREMITY N/A 07/27/2018   Procedure: ABDOMINAL AORTOGRAM W/LOWER EXTREMITY;  Surgeon: Lorretta Harp, MD;  Location: [facility];  Service: Cardiovascular;  Laterality: N/A;  . APPENDECTOMY  1943  . CATARACT EXTRACTION  2012   x2  . COLONOSCOPY N/A 11/01/2014   Procedure: COLONOSCOPY;  Surgeon: Aviva Signs, MD;  [facility];  Service: Gastroenterology;  Laterality: N/A;  . ESOPHAGOGASTRODUODENOSCOPY N/A 11/01/2014   Procedure: ESOPHAGOGASTRODUODENOSCOPY (EGD);  Surgeon: Aviva Signs, MD;  [facility];  Service: Gastroenterology;  Laterality: N/A;  . HERNIA REPAIR Left   . INGUINAL HERNIA REPAIR Right 03/01/2013   Procedure: RIGHT INGUINAL HERNIORRHAPHY;  Surgeon: Jamesetta So, MD;  Location: [facility];  Service: General;  Laterality: Right;  . INSERTION OF MESH Right 03/01/2013   Procedure: INSERTION OF MESH;  Surgeon: Jamesetta So, MD;  Location: [facility];  Service: General;  Laterality: Right;  . NM MYOCAR PERF WALL MOTION  06/05/2010   no significant ischemia  . PERIPHERAL VASCULAR ATHERECTOMY Right 08/03/2018   Procedure: PERIPHERAL VASCULAR ATHERECTOMY;  Surgeon: Lorretta Harp, MD;  Location: [facility];  Service: Cardiovascular;  Laterality: Right;  . stent  06/14/2010   left leg  . US ECHOCARDIOGRAPHY  01/21/2006  moderate mitral annular ca+, mild MR, AOV moderately sclerotic.     reports that he quit smoking about 83 years ago. He has quit using smokeless tobacco.  His smokeless tobacco use included chew. He reports that he does not drink alcohol or use drugs.  Allergies  Allergen Reactions  . Iodinated Diagnostic Agents     Other reaction(s): Fever  . Iodine-131 Other (See Comments)    Breakout, fever  . Iohexol      Code: RASH, Desc: PT STATES HOT ALL OVER HAD TO  HAVE ICE PACKS ALL OVER HIM AND DIFF BREATHING, PT REFUSED IV DYE     Family History  Problem Relation Age of Onset  . Cancer Mother   . Heart attack Father   . Stroke Father   . Heart attack Brother   . Heart attack Brother     Prior to Admission medications   Medication Sig Start Date End Date Taking? Authorizing Provider  apixaban (ELIQUIS) 2.5 MG TABS tablet Take 1 tablet (2.5 mg total) by mouth 2 (two) times daily. 08/11/17   Pixie Casino, MD  aspirin EC 81 MG EC tablet Take 1 tablet (81 mg total) by mouth daily. Stop taking aspirin 09/04/2018. 08/05/18 09/04/18  Kroeger, Daleen Snook M., PA-C  atorvastatin (LIPITOR) 40 MG tablet TAKE 1 TABLET BY MOUTH ONCE DAILY Patient taking differently: Take 40 mg by mouth daily after supper.  02/17/18   Hilty, Nadean Corwin, MD  calcium carbonate (TUMS - DOSED IN MG ELEMENTAL CALCIUM) 500 MG chewable tablet Chew 1 tablet by mouth as needed for indigestion or heartburn.    [provider]  clopidogrel (PLAVIX) 75 MG tablet Take 1 tablet (75 mg total) by mouth daily with breakfast. 08/05/18   Kroeger, Daleen Snook M., PA-C  COSAMIN DS 500-400 MG tablet Take 1 tablet by mouth 2 (two) times a day.    [provider]  diphenhydrAMINE (BENADRYL) 50 MG tablet PLEASE FOLLOW THE INSTRUCTIONS ON YOUR AFTER VISIT SUMMARY 07/31/18   Lorretta Harp, MD  docusate sodium (COLACE) 100 MG capsule Take 100 mg by mouth 2 (two) times daily as needed (for use with oxycodone to prevent constipation.).    [provider]  dorzolamide-timolol (COSOPT) 22.3-6.8 MG/ML ophthalmic solution Place 1 drop into both eyes 2 (two) times daily.    [provider]  doxazosin (CARDURA) 2 MG tablet Take 1 tablet (2 mg total) by mouth at bedtime. 07/08/18   Kroeger, Lorelee Cover., PA-C  finasteride (PROSCAR) 5 MG tablet Take 5 mg by mouth every evening.     [provider]  furosemide (LASIX) 40 MG tablet TAKE 1 TABLET(40 MG) BY MOUTH DAILY Patient taking  differently: Take 40 mg by mouth daily. TAKE 1 TABLET(40 MG) BY MOUTH DAILY 07/08/18   Kroeger, Daleen Snook M., PA-C  glipiZIDE (GLUCOTROL) 5 MG tablet Take 5 mg by mouth 2 (two) times daily.     [provider]  isosorbide mononitrate (IMDUR) 30 MG 24 hr tablet Take 0.5 tablets (15 mg total) by mouth daily. 07/27/18   Kroeger, Lorelee Cover., PA-C  levothyroxine (SYNTHROID, LEVOTHROID) 50 MCG tablet Take 50 mcg by mouth daily before breakfast.  07/08/16   [provider]  metoprolol tartrate (LOPRESSOR) 25 MG tablet Take 12.5 mg as needed for palpitations/heart racing Patient taking differently: Take 12.5 mg by mouth daily as needed (palpitations/heart racing). Take 12.5 mg as needed for palpitations/heart racin 07/26/17   Barton Dubois, MD  Omega-3 Fatty Acids (FISH OIL PO)  Take 1 capsule by mouth daily.    [provider]  oxyCODONE-acetaminophen (PERCOCET) 5-325 MG tablet Take 1-2 tablets by mouth every 8 (eight) hours as needed for severe pain. 07/09/18   Mesner, Corene Cornea, MD  oxyCODONE-acetaminophen (PERCOCET) 5-325 MG tablet Take 1 tablet by mouth every 8 (eight) hours as needed for severe pain. 08/07/18   Erlene Quan, PA-C  pyridOXINE (VITAMIN B-6) 100 MG tablet Take 100 mg by mouth daily.    [provider]  ranitidine (ZANTAC) 150 MG tablet Take 150 mg by mouth at bedtime.    [provider]  tamsulosin (FLOMAX) 0.4 MG CAPS Take 0.4 mg by mouth 2 (two) times daily.     [provider]  vitamin C (ASCORBIC ACID) 500 MG tablet Take 500 mg by mouth daily.    [provider]  vitamin E 400 UNIT capsule Take 400 Units by mouth daily.    [provider]  Zinc 50 MG TABS Take 50 mg by mouth every other day.    [provider]    Physical Exam: Constitutional: Moderately built and nourished. Vitals:   08/20/18 1730 08/20/18 1815 08/20/18 1838 08/20/18 1843  BP: (!) 151/67  139/63   Pulse: 80 81 74   Resp:      Temp:    (!)  97.5 F (36.4 C)  TempSrc:    Rectal  SpO2: 99% 100% 99%    Eyes: Anicteric no pallor. ENMT: No discharge from the ears eyes nose or mouth. Neck: No mass felt.  No neck rigidity. Respiratory: No rhonchi or crepitations. Cardiovascular: S1-S2 heard. Abdomen: Soft nontender bowel sounds present. Musculoskeletal: Edema with both lower extremity more on the right side. Skin: Erythema of the right foot and right distal leg and gangrenous looking toe with some darkish areas on the plantar aspect of the foot. Neurologic: Alert awake oriented to time place and person.  Moves all extremities. Psychiatric: Appears normal.   Labs on Admission: I have personally reviewed following labs and imaging studies  CBC: Recent Labs  Lab 08/20/18 1257  WBC 13.3*  NEUTROABS 9.9*  HGB 8.7*  HCT 25.5*  MCV 92.7  PLT 353   Basic Metabolic Panel: Recent Labs  Lab 08/20/18 1257  NA 124*  K 3.7  CL 88*  CO2 24  GLUCOSE 157*  BUN 21  CREATININE 1.41*  CALCIUM 8.3*   GFR: Estimated Creatinine Clearance: 43.8 mL/min (A) (by C-G formula based on SCr of 1.41 mg/dL (H)). Liver Function Tests: Recent Labs  Lab 08/20/18 1257  AST 117*  ALT 139*  ALKPHOS 82  BILITOT 0.7  PROT 6.2*  ALBUMIN 2.3*   No results for input(s): LIPASE, AMYLASE in the last 168 hours. No results for input(s): AMMONIA in the last 168 hours. Coagulation Profile: No results for input(s): INR, PROTIME in the last 168 hours. Cardiac Enzymes: No results for input(s): CKTOTAL, CKMB, CKMBINDEX, TROPONINI in the last 168 hours. BNP (last 3 results) No results for input(s): PROBNP in the last 8760 hours. HbA1C: No results for input(s): HGBA1C in the last 72 hours. CBG: No results for input(s): GLUCAP in the last 168 hours. Lipid Profile: No results for input(s): CHOL, HDL, LDLCALC, TRIG, CHOLHDL, LDLDIRECT in the last 72 hours. Thyroid Function Tests: No results for input(s): TSH, T4TOTAL, FREET4, T3FREE, THYROIDAB in  the last 72 hours. Anemia Panel: No results for input(s): VITAMINB12, FOLATE, FERRITIN, TIBC, IRON, RETICCTPCT in the last 72 hours. Urine analysis: No results found  for: COLORURINE, APPEARANCEUR, LABSPEC, PHURINE, GLUCOSEU, HGBUR, BILIRUBINUR, KETONESUR, PROTEINUR, UROBILINOGEN, NITRITE, LEUKOCYTESUR Sepsis Labs: @LABRCNTIP (procalcitonin:4,lacticidven:4) )No results found for this or any previous visit (from the past 240 hour(s)).   Radiological Exams on Admission: Dg Foot Complete Right  Result Date: 08/20/2018 CLINICAL DATA:  Great toe pain and swelling. EXAM: RIGHT FOOT COMPLETE - 3+ VIEW COMPARISON:  05/29/2018 MRI. FINDINGS: The bony structures are intact. I do not see any definite destructive bony changes to suggest osteomyelitis. No definite findings for septic arthritis. Extensive vascular calcifications are noted. IMPRESSION: No acute bony findings or destructive bony changes. Electronically Signed   By: Marijo Sanes M.D.   On: 08/20/2018 19:13    EKG: Independently reviewed.  A. fib rate controlled.  Assessment/Plan Principal Problem:   Ischemic foot Active Problems:   Essential hypertension   Aortic valve stenosis   Hypothyroidism   PAF (paroxysmal atrial fibrillation) (HCC)   Atrial fibrillation, chronic   Cellulitis of great toe of right foot   Type 2 diabetes mellitus with foot ulcer, without long-term current use of insulin ([place])    1. Right lower extremity cellulitis with gangrenous toes and peripheral vascular disease status post recent procedure done 2 weeks ago -cardiologist and orthopedic surgeon has been consulted patient is on empiric antibiotics.  In anticipation of procedure will hold Eliquis and keep patient on heparin.  Patient is on antiplatelet agents. 2. Proximal atrial fibrillation on no rate limiting medications since history of bradycardia.  On heparin at this time in anticipation of procedure will hold Eliquis. 3. Worsening anemia with stool for  occult blood positive.  Closely follow CBC. 4. Diabetes mellitus type 2 we will keep patient on sliding scale coverage. 5. Hypothyroidism Synthroid. 6. History of BPH we will continue home medication. 7. History of moderate aortic valve stenosis -closely observe.   DVT prophylaxis: Heparin. Code Status: Full code. Family Communication: Patient's daughter. Disposition Plan: To be determined. Consults called: Cardiology and orthopedics. Admission status: Observation   Rise Patience MD [facility] Pager [phone].  If 7PM-7AM, please contact night-coverage [email or web address] Password [facility]  08/20/2018, 8:42 PM

## 2018-08-21 ENCOUNTER — Ambulatory Visit (HOSPITAL_COMMUNITY): Payer: Medicare Other

## 2018-08-21 DIAGNOSIS — Z7902 Long term (current) use of antithrombotics/antiplatelets: Secondary | ICD-10-CM | POA: Diagnosis not present

## 2018-08-21 DIAGNOSIS — Z823 Family history of stroke: Secondary | ICD-10-CM | POA: Diagnosis not present

## 2018-08-21 DIAGNOSIS — E039 Hypothyroidism, unspecified: Secondary | ICD-10-CM

## 2018-08-21 DIAGNOSIS — M79672 Pain in left foot: Secondary | ICD-10-CM | POA: Diagnosis not present

## 2018-08-21 DIAGNOSIS — I350 Nonrheumatic aortic (valve) stenosis: Secondary | ICD-10-CM | POA: Diagnosis not present

## 2018-08-21 DIAGNOSIS — E43 Unspecified severe protein-calorie malnutrition: Secondary | ICD-10-CM | POA: Diagnosis not present

## 2018-08-21 DIAGNOSIS — L03031 Cellulitis of right toe: Secondary | ICD-10-CM | POA: Diagnosis not present

## 2018-08-21 DIAGNOSIS — E1152 Type 2 diabetes mellitus with diabetic peripheral angiopathy with gangrene: Secondary | ICD-10-CM | POA: Diagnosis not present

## 2018-08-21 DIAGNOSIS — E119 Type 2 diabetes mellitus without complications: Secondary | ICD-10-CM | POA: Diagnosis not present

## 2018-08-21 DIAGNOSIS — I70261 Atherosclerosis of native arteries of extremities with gangrene, right leg: Secondary | ICD-10-CM | POA: Diagnosis not present

## 2018-08-21 DIAGNOSIS — Z7984 Long term (current) use of oral hypoglycemic drugs: Secondary | ICD-10-CM | POA: Diagnosis not present

## 2018-08-21 DIAGNOSIS — I129 Hypertensive chronic kidney disease with stage 1 through stage 4 chronic kidney disease, or unspecified chronic kidney disease: Secondary | ICD-10-CM | POA: Diagnosis not present

## 2018-08-21 DIAGNOSIS — E785 Hyperlipidemia, unspecified: Secondary | ICD-10-CM | POA: Diagnosis not present

## 2018-08-21 DIAGNOSIS — I10 Essential (primary) hypertension: Secondary | ICD-10-CM

## 2018-08-21 DIAGNOSIS — Z87891 Personal history of nicotine dependence: Secondary | ICD-10-CM | POA: Diagnosis not present

## 2018-08-21 DIAGNOSIS — E1159 Type 2 diabetes mellitus with other circulatory complications: Secondary | ICD-10-CM

## 2018-08-21 DIAGNOSIS — Z7189 Other specified counseling: Secondary | ICD-10-CM | POA: Diagnosis not present

## 2018-08-21 DIAGNOSIS — E8770 Fluid overload, unspecified: Secondary | ICD-10-CM | POA: Diagnosis not present

## 2018-08-21 DIAGNOSIS — L97509 Non-pressure chronic ulcer of other part of unspecified foot with unspecified severity: Secondary | ICD-10-CM | POA: Diagnosis not present

## 2018-08-21 DIAGNOSIS — Z20828 Contact with and (suspected) exposure to other viral communicable diseases: Secondary | ICD-10-CM | POA: Diagnosis present

## 2018-08-21 DIAGNOSIS — Z4781 Encounter for orthopedic aftercare following surgical amputation: Secondary | ICD-10-CM | POA: Diagnosis not present

## 2018-08-21 DIAGNOSIS — G8918 Other acute postprocedural pain: Secondary | ICD-10-CM | POA: Diagnosis not present

## 2018-08-21 DIAGNOSIS — E1165 Type 2 diabetes mellitus with hyperglycemia: Secondary | ICD-10-CM | POA: Diagnosis not present

## 2018-08-21 DIAGNOSIS — Z89511 Acquired absence of right leg below knee: Secondary | ICD-10-CM | POA: Diagnosis not present

## 2018-08-21 DIAGNOSIS — E1142 Type 2 diabetes mellitus with diabetic polyneuropathy: Secondary | ICD-10-CM | POA: Diagnosis not present

## 2018-08-21 DIAGNOSIS — E871 Hypo-osmolality and hyponatremia: Secondary | ICD-10-CM | POA: Diagnosis not present

## 2018-08-21 DIAGNOSIS — I96 Gangrene, not elsewhere classified: Secondary | ICD-10-CM | POA: Diagnosis not present

## 2018-08-21 DIAGNOSIS — M79674 Pain in right toe(s): Secondary | ICD-10-CM | POA: Diagnosis not present

## 2018-08-21 DIAGNOSIS — G92 Toxic encephalopathy: Secondary | ICD-10-CM | POA: Diagnosis not present

## 2018-08-21 DIAGNOSIS — L97419 Non-pressure chronic ulcer of right heel and midfoot with unspecified severity: Secondary | ICD-10-CM | POA: Diagnosis not present

## 2018-08-21 DIAGNOSIS — I998 Other disorder of circulatory system: Secondary | ICD-10-CM | POA: Diagnosis not present

## 2018-08-21 DIAGNOSIS — I482 Chronic atrial fibrillation, unspecified: Secondary | ICD-10-CM | POA: Diagnosis not present

## 2018-08-21 DIAGNOSIS — Z79899 Other long term (current) drug therapy: Secondary | ICD-10-CM | POA: Diagnosis not present

## 2018-08-21 DIAGNOSIS — H409 Unspecified glaucoma: Secondary | ICD-10-CM | POA: Diagnosis not present

## 2018-08-21 DIAGNOSIS — I739 Peripheral vascular disease, unspecified: Secondary | ICD-10-CM | POA: Diagnosis not present

## 2018-08-21 DIAGNOSIS — S90822A Blister (nonthermal), left foot, initial encounter: Secondary | ICD-10-CM | POA: Diagnosis not present

## 2018-08-21 DIAGNOSIS — N179 Acute kidney failure, unspecified: Secondary | ICD-10-CM | POA: Diagnosis not present

## 2018-08-21 DIAGNOSIS — L03115 Cellulitis of right lower limb: Secondary | ICD-10-CM | POA: Diagnosis not present

## 2018-08-21 DIAGNOSIS — G4730 Sleep apnea, unspecified: Secondary | ICD-10-CM | POA: Diagnosis not present

## 2018-08-21 DIAGNOSIS — E11621 Type 2 diabetes mellitus with foot ulcer: Secondary | ICD-10-CM | POA: Diagnosis not present

## 2018-08-21 DIAGNOSIS — Z515 Encounter for palliative care: Secondary | ICD-10-CM | POA: Diagnosis not present

## 2018-08-21 DIAGNOSIS — K5900 Constipation, unspecified: Secondary | ICD-10-CM | POA: Diagnosis not present

## 2018-08-21 DIAGNOSIS — R2689 Other abnormalities of gait and mobility: Secondary | ICD-10-CM | POA: Diagnosis not present

## 2018-08-21 DIAGNOSIS — E1122 Type 2 diabetes mellitus with diabetic chronic kidney disease: Secondary | ICD-10-CM | POA: Diagnosis not present

## 2018-08-21 DIAGNOSIS — L97519 Non-pressure chronic ulcer of other part of right foot with unspecified severity: Secondary | ICD-10-CM | POA: Diagnosis not present

## 2018-08-21 DIAGNOSIS — Z8249 Family history of ischemic heart disease and other diseases of the circulatory system: Secondary | ICD-10-CM | POA: Diagnosis not present

## 2018-08-21 DIAGNOSIS — D62 Acute posthemorrhagic anemia: Secondary | ICD-10-CM | POA: Diagnosis not present

## 2018-08-21 DIAGNOSIS — H9190 Unspecified hearing loss, unspecified ear: Secondary | ICD-10-CM | POA: Diagnosis not present

## 2018-08-21 DIAGNOSIS — M7989 Other specified soft tissue disorders: Secondary | ICD-10-CM | POA: Diagnosis not present

## 2018-08-21 DIAGNOSIS — N183 Chronic kidney disease, stage 3 (moderate): Secondary | ICD-10-CM | POA: Diagnosis not present

## 2018-08-21 DIAGNOSIS — I480 Paroxysmal atrial fibrillation: Secondary | ICD-10-CM | POA: Diagnosis not present

## 2018-08-21 DIAGNOSIS — G4733 Obstructive sleep apnea (adult) (pediatric): Secondary | ICD-10-CM | POA: Diagnosis not present

## 2018-08-21 DIAGNOSIS — Z7982 Long term (current) use of aspirin: Secondary | ICD-10-CM | POA: Diagnosis not present

## 2018-08-21 DIAGNOSIS — Z7901 Long term (current) use of anticoagulants: Secondary | ICD-10-CM | POA: Diagnosis not present

## 2018-08-21 DIAGNOSIS — Z91041 Radiographic dye allergy status: Secondary | ICD-10-CM | POA: Diagnosis not present

## 2018-08-21 DIAGNOSIS — R195 Other fecal abnormalities: Secondary | ICD-10-CM | POA: Diagnosis not present

## 2018-08-21 LAB — CBC WITH DIFFERENTIAL/PLATELET
Abs Immature Granulocytes: 0.11 10*3/uL — ABNORMAL HIGH (ref 0.00–0.07)
Basophils Absolute: 0 10*3/uL (ref 0.0–0.1)
Basophils Relative: 0 %
Eosinophils Absolute: 0.3 10*3/uL (ref 0.0–0.5)
Eosinophils Relative: 2 %
HCT: 22.7 % — ABNORMAL LOW (ref 39.0–52.0)
Hemoglobin: 7.7 g/dL — ABNORMAL LOW (ref 13.0–17.0)
Immature Granulocytes: 1 %
Lymphocytes Relative: 16 %
Lymphs Abs: 1.9 10*3/uL (ref 0.7–4.0)
MCH: 30.8 pg (ref 26.0–34.0)
MCHC: 33.9 g/dL (ref 30.0–36.0)
MCV: 90.8 fL (ref 80.0–100.0)
Monocytes Absolute: 0.8 10*3/uL (ref 0.1–1.0)
Monocytes Relative: 7 %
Neutro Abs: 8.8 10*3/uL — ABNORMAL HIGH (ref 1.7–7.7)
Neutrophils Relative %: 74 %
Platelets: 350 10*3/uL (ref 150–400)
RBC: 2.5 MIL/uL — ABNORMAL LOW (ref 4.22–5.81)
RDW: 13.5 % (ref 11.5–15.5)
WBC: 11.9 10*3/uL — ABNORMAL HIGH (ref 4.0–10.5)
nRBC: 0 % (ref 0.0–0.2)

## 2018-08-21 LAB — COMPREHENSIVE METABOLIC PANEL
ALT: 132 U/L — ABNORMAL HIGH (ref 0–44)
AST: 120 U/L — ABNORMAL HIGH (ref 15–41)
Albumin: 2 g/dL — ABNORMAL LOW (ref 3.5–5.0)
Alkaline Phosphatase: 76 U/L (ref 38–126)
Anion gap: 10 (ref 5–15)
BUN: 19 mg/dL (ref 8–23)
CO2: 23 mmol/L (ref 22–32)
Calcium: 7.9 mg/dL — ABNORMAL LOW (ref 8.9–10.3)
Chloride: 93 mmol/L — ABNORMAL LOW (ref 98–111)
Creatinine, Ser: 1.19 mg/dL (ref 0.61–1.24)
GFR calc Af Amer: >60 mL/min (ref >60)
GFR calc non Af Amer: 54 mL/min — ABNORMAL LOW (ref >60)
Glucose, Bld: 147 mg/dL — ABNORMAL HIGH (ref 70–99)
Potassium: 3.6 mmol/L (ref 3.5–5.1)
Sodium: 126 mmol/L — ABNORMAL LOW (ref 135–145)
Total Bilirubin: 0.8 mg/dL (ref 0.3–1.2)
Total Protein: 5.3 g/dL — ABNORMAL LOW (ref 6.5–8.1)

## 2018-08-21 LAB — GLUCOSE, CAPILLARY
Glucose-Capillary: 134 mg/dL — ABNORMAL HIGH (ref 70–99)
Glucose-Capillary: 140 mg/dL — ABNORMAL HIGH (ref 70–99)
Glucose-Capillary: 111 mg/dL — ABNORMAL HIGH (ref 70–99)

## 2018-08-21 LAB — APTT
aPTT: 106 seconds — ABNORMAL HIGH (ref 24–36)
aPTT: 105 seconds — ABNORMAL HIGH (ref 24–36)

## 2018-08-21 LAB — HEPARIN LEVEL (UNFRACTIONATED): Heparin Unfractionated: 2.06 IU/mL — ABNORMAL HIGH (ref 0.30–0.70)

## 2018-08-21 LAB — ABO/RH: ABO/RH(D): O POS

## 2018-08-21 MED ORDER — OXYCODONE-ACETAMINOPHEN 5-325 MG PO TABS
1.0000 | ORAL_TABLET | Freq: Three times a day (TID) | ORAL | Status: DC | PRN
  Administered 2018-08-21: 1 via ORAL
  Filled 2018-08-21: qty 1

## 2018-08-21 MED ORDER — HEPARIN (PORCINE) 25000 UT/250ML-% IV SOLN
900.0000 [IU]/h | INTRAVENOUS | Status: DC
  Filled 2018-08-21: qty 250

## 2018-08-21 MED ORDER — ASPIRIN EC 81 MG PO TBEC
81.0000 mg | DELAYED_RELEASE_TABLET | Freq: Every day | ORAL | Status: DC
  Administered 2018-08-21 – 2018-09-04 (×14): 81 mg via ORAL
  Filled 2018-08-21 (×14): qty 1

## 2018-08-21 MED ORDER — CLOPIDOGREL BISULFATE 75 MG PO TABS
75.0000 mg | ORAL_TABLET | Freq: Every day | ORAL | Status: DC
  Administered 2018-08-21 – 2018-08-22 (×2): 75 mg via ORAL
  Filled 2018-08-21 (×2): qty 1

## 2018-08-21 MED ORDER — TAMSULOSIN HCL 0.4 MG PO CAPS
0.4000 mg | ORAL_CAPSULE | Freq: Two times a day (BID) | ORAL | Status: DC
  Administered 2018-08-21 – 2018-09-07 (×35): 0.4 mg via ORAL
  Filled 2018-08-21 (×35): qty 1

## 2018-08-21 MED ORDER — DOCUSATE SODIUM 100 MG PO CAPS
100.0000 mg | ORAL_CAPSULE | Freq: Two times a day (BID) | ORAL | Status: DC | PRN
  Administered 2018-08-23 – 2018-08-28 (×4): 100 mg via ORAL
  Filled 2018-08-21 (×4): qty 1

## 2018-08-21 MED ORDER — ONDANSETRON HCL 4 MG/2ML IJ SOLN: 4.0000 mg | Freq: Four times a day (QID) | INTRAMUSCULAR | Status: DC | PRN

## 2018-08-21 MED ORDER — DOXAZOSIN MESYLATE 2 MG PO TABS
2.0000 mg | ORAL_TABLET | Freq: Every day | ORAL | Status: DC
  Administered 2018-08-21 – 2018-09-06 (×18): 2 mg via ORAL
  Filled 2018-08-21 (×19): qty 1

## 2018-08-21 MED ORDER — ACETAMINOPHEN 325 MG PO TABS
650.0000 mg | ORAL_TABLET | Freq: Four times a day (QID) | ORAL | Status: DC | PRN
  Administered 2018-08-23 – 2018-08-27 (×2): 650 mg via ORAL
  Filled 2018-08-21 (×2): qty 2

## 2018-08-21 MED ORDER — ATORVASTATIN CALCIUM 40 MG PO TABS
40.0000 mg | ORAL_TABLET | Freq: Every day | ORAL | Status: DC
  Administered 2018-08-21 – 2018-09-06 (×17): 40 mg via ORAL
  Filled 2018-08-21 (×17): qty 1

## 2018-08-21 MED ORDER — ONDANSETRON HCL 4 MG PO TABS: 4.0000 mg | ORAL_TABLET | Freq: Four times a day (QID) | ORAL | Status: DC | PRN

## 2018-08-21 MED ORDER — FINASTERIDE 5 MG PO TABS
5.0000 mg | ORAL_TABLET | Freq: Every evening | ORAL | Status: DC
  Administered 2018-08-21 – 2018-09-06 (×18): 5 mg via ORAL
  Filled 2018-08-21 (×18): qty 1

## 2018-08-21 MED ORDER — INSULIN ASPART 100 UNIT/ML ~~LOC~~ SOLN
0.0000 [IU] | Freq: Three times a day (TID) | SUBCUTANEOUS | Status: DC
  Administered 2018-08-21 (×2): 1 [IU] via SUBCUTANEOUS
  Administered 2018-08-22 – 2018-08-24 (×8): 2 [IU] via SUBCUTANEOUS
  Administered 2018-08-24: 1 [IU] via SUBCUTANEOUS
  Administered 2018-08-25: 3 [IU] via SUBCUTANEOUS
  Administered 2018-08-25 – 2018-08-26 (×4): 5 [IU] via SUBCUTANEOUS
  Administered 2018-08-27 (×2): 2 [IU] via SUBCUTANEOUS
  Administered 2018-08-27: 5 [IU] via SUBCUTANEOUS
  Administered 2018-08-28 (×2): 2 [IU] via SUBCUTANEOUS
  Administered 2018-08-29: 5 [IU] via SUBCUTANEOUS
  Administered 2018-08-29 – 2018-08-30 (×2): 2 [IU] via SUBCUTANEOUS
  Administered 2018-08-30 (×2): 3 [IU] via SUBCUTANEOUS
  Administered 2018-08-31: 2 [IU] via SUBCUTANEOUS
  Administered 2018-08-31: 18:00:00 3 [IU] via SUBCUTANEOUS
  Administered 2018-08-31: 2 [IU] via SUBCUTANEOUS
  Administered 2018-09-01: 3 [IU] via SUBCUTANEOUS
  Administered 2018-09-01: 2 [IU] via SUBCUTANEOUS
  Administered 2018-09-01 – 2018-09-02 (×2): 3 [IU] via SUBCUTANEOUS
  Administered 2018-09-02: 5 [IU] via SUBCUTANEOUS
  Administered 2018-09-02: 2 [IU] via SUBCUTANEOUS
  Administered 2018-09-03 (×2): 1 [IU] via SUBCUTANEOUS
  Administered 2018-09-03 – 2018-09-04 (×2): 3 [IU] via SUBCUTANEOUS
  Administered 2018-09-04: 1 [IU] via SUBCUTANEOUS
  Administered 2018-09-04: 2 [IU] via SUBCUTANEOUS
  Administered 2018-09-05: 13:00:00 1 [IU] via SUBCUTANEOUS
  Administered 2018-09-05 – 2018-09-06 (×3): 2 [IU] via SUBCUTANEOUS
  Administered 2018-09-07: 1 [IU] via SUBCUTANEOUS
  Administered 2018-09-07: 2 [IU] via SUBCUTANEOUS

## 2018-08-21 MED ORDER — FAMOTIDINE 20 MG PO TABS
20.0000 mg | ORAL_TABLET | Freq: Two times a day (BID) | ORAL | Status: DC
  Administered 2018-08-21 – 2018-09-07 (×35): 20 mg via ORAL
  Filled 2018-08-21 (×35): qty 1

## 2018-08-21 MED ORDER — VITAMIN E 180 MG (400 UNIT) PO CAPS
400.0000 [IU] | ORAL_CAPSULE | Freq: Every day | ORAL | Status: DC
  Administered 2018-08-21 – 2018-09-07 (×18): 400 [IU] via ORAL
  Filled 2018-08-21 (×18): qty 1

## 2018-08-21 MED ORDER — DORZOLAMIDE HCL-TIMOLOL MAL 2-0.5 % OP SOLN
1.0000 [drp] | Freq: Two times a day (BID) | OPHTHALMIC | Status: DC
  Administered 2018-08-21 – 2018-09-07 (×35): 1 [drp] via OPHTHALMIC
  Filled 2018-08-21: qty 10

## 2018-08-21 MED ORDER — LEVOTHYROXINE SODIUM 50 MCG PO TABS
50.0000 ug | ORAL_TABLET | Freq: Every day | ORAL | Status: DC
  Administered 2018-08-21 – 2018-09-07 (×18): 50 ug via ORAL
  Filled 2018-08-21 (×18): qty 1

## 2018-08-21 MED ORDER — VANCOMYCIN HCL IN DEXTROSE 1-5 GM/200ML-% IV SOLN
1000.0000 mg | INTRAVENOUS | Status: DC
  Administered 2018-08-22 (×2): 1000 mg via INTRAVENOUS
  Filled 2018-08-21 (×2): qty 200

## 2018-08-21 MED ORDER — CALCIUM CARBONATE ANTACID 500 MG PO CHEW
1.0000 | CHEWABLE_TABLET | ORAL | Status: DC | PRN
  Administered 2018-08-24: 200 mg via ORAL
  Filled 2018-08-21: qty 1

## 2018-08-21 MED ORDER — ACETAMINOPHEN 650 MG RE SUPP: 650.0000 mg | Freq: Four times a day (QID) | RECTAL | Status: DC | PRN

## 2018-08-21 MED ORDER — VITAMIN C 500 MG PO TABS
500.0000 mg | ORAL_TABLET | Freq: Every day | ORAL | Status: DC
  Administered 2018-08-21 – 2018-09-07 (×17): 500 mg via ORAL
  Filled 2018-08-21 (×17): qty 1

## 2018-08-21 MED ORDER — ZINC 50 MG PO TABS: 50.0000 mg | ORAL_TABLET | ORAL | Status: DC

## 2018-08-21 MED ORDER — VITAMIN B-6 100 MG PO TABS
100.0000 mg | ORAL_TABLET | Freq: Every day | ORAL | Status: DC
  Administered 2018-08-21 – 2018-09-07 (×17): 100 mg via ORAL
  Filled 2018-08-21 (×19): qty 1

## 2018-08-21 MED ORDER — ISOSORBIDE MONONITRATE ER 30 MG PO TB24
15.0000 mg | ORAL_TABLET | Freq: Every day | ORAL | Status: DC
  Administered 2018-08-21 – 2018-08-22 (×2): 15 mg via ORAL
  Filled 2018-08-21 (×2): qty 1

## 2018-08-21 NOTE — Progress Notes (Signed)
ANTICOAGULATION CONSULT NOTE - Follow Up Consult  Pharmacy Consult for Heparin Indication: atrial fibrillation  Allergies  Allergen Reactions  . Iodinated Diagnostic Agents     Other reaction(s): Fever  . Iodine-131 Other (See Comments)    Breakout, fever  . Iohexol      Code: RASH, Desc: PT STATES HOT ALL OVER HAD TO HAVE ICE PACKS ALL OVER HIM AND DIFF BREATHING, PT REFUSED IV DYE     Patient Measurements: Height: 5' 11.5" (181.6 cm) Weight: 222 lb 14.2 oz (101.1 kg) IBW/kg (Calculated) : 76.45  Vital Signs: Temp: 98.2 F (36.8 C) (08/07 1254) BP: 118/59 (08/07 1254) Pulse Rate: 60 (08/07 1254)  Labs: Recent Labs    08/20/18 1257 08/21/18 0742 08/21/18 1623  HGB 8.7* 7.7*  --   HCT 25.5* 22.7*  --   PLT 372 350  --   APTT  --  106* 105*  HEPARINUNFRC  --  2.06*  --   CREATININE 1.41* 1.19  --     Estimated Creatinine Clearance: 52.4 mL/min (by C-G formula based on SCr of 1.19 mg/dL).  Assessment:  - possibly surgery on foot, ortho to evaluate pt 8/7  Anticoag: On apixaban 2.5 mg BID for Afib PTA. Last dose taken 8/6 @0930 .  - APTT 106 (hep held and dose decreased) and 6 hrs aPTT 105 essentially not changed. IV site bleeding has stopped per RN.   Goal of Therapy:  aPTT 66-102 seconds Monitor platelets by anticoagulation protocol: Yes   Plan:  Decrease IV heparin to 1000units/hr HL, aPTT, and CBC in AM and daily   Kyler Lerette S. Alford Highland, PharmD, BCPS Clinical Staff Pharmacist [facility] 08/21/2018,6:57 PM

## 2018-08-21 NOTE — Consult Note (Signed)
ORTHOPAEDIC CONSULTATION  REQUESTING PHYSICIAN: Guilford Shi, MD  Chief Complaint: Painful gangrenous changes to the right foot.  HPI: [name] is a 85 y.o. male who presents with gangrene of the right foot with dry gangrene of the great toe ischemic gangrenous changes to the lesser toes with a large painful ischemic ulcer on the right heel.  Patient is status post endovascular procedure with Dr. Gwenlyn Found.  He has inline flow to the knee with collateral circulation distal to the knee.  Past Medical History:  Diagnosis Date  . Arthritis   . Borderline diabetic   . Glaucoma   . Hyperlipidemia   . Hypertension   . PVD (peripheral vascular disease) ([place])   . Sleep apnea    Cpap ordered but doesnt use   Past Surgical History:  Procedure Laterality Date  . ABDOMINAL AORTOGRAM W/LOWER EXTREMITY N/A 07/27/2018   Procedure: ABDOMINAL AORTOGRAM W/LOWER EXTREMITY;  Surgeon: Lorretta Harp, MD;  Location: [facility] [place] [facility];  Service: Cardiovascular;  Laterality: N/A;  . APPENDECTOMY  1943  . CATARACT EXTRACTION  2012   x2  . COLONOSCOPY N/A 11/01/2014   Procedure: COLONOSCOPY;  Surgeon: Aviva Signs, MD;  [facility];  Service: Gastroenterology;  Laterality: N/A;  . ESOPHAGOGASTRODUODENOSCOPY N/A 11/01/2014   Procedure: ESOPHAGOGASTRODUODENOSCOPY (EGD);  Surgeon: Aviva Signs, MD;  [facility];  Service: Gastroenterology;  Laterality: N/A;  . HERNIA REPAIR Left   . INGUINAL HERNIA REPAIR Right 03/01/2013   Procedure: RIGHT INGUINAL HERNIORRHAPHY;  Surgeon: Jamesetta So, MD;  [facility];  Service: General;  Laterality: Right;  . INSERTION OF MESH Right 03/01/2013   Procedure: INSERTION OF MESH;  Surgeon: Jamesetta So, MD;  [facility];  Service: General;  Laterality: Right;  . NM MYOCAR PERF WALL MOTION  06/05/2010   no significant ischemia  . PERIPHERAL VASCULAR ATHERECTOMY Right 08/03/2018   Procedure: PERIPHERAL VASCULAR ATHERECTOMY;   Surgeon: Lorretta Harp, MD;  Location: [place] [facility];  Service: Cardiovascular;  Laterality: Right;  . stent  06/14/2010   left leg  . US ECHOCARDIOGRAPHY  01/21/2006   moderate mitral annular ca+, mild MR, AOV moderately sclerotic.   Social History   Socioeconomic History  . Marital status: Widowed    Spouse name: Not on file  . Number of children: Not on file  . Years of education: Not on file  . Highest education level: Not on file  Occupational History  . Not on file  Social Needs  . Financial resource strain: Not on file  . Food insecurity    Worry: Not on file    Inability: Not on file  . Transportation needs    Medical: Not on file    Non-medical: Not on file  Tobacco Use  . Smoking status: Former Smoker    Quit date: 01/14/1935    Years since quitting: 83.6  . Smokeless tobacco: Former Systems developer    Types: Chew  Substance and Sexual Activity  . Alcohol use: No  . Drug use: No  . Sexual activity: Not on file  Lifestyle  . Physical activity    Days per week: Not on file    Minutes per session: Not on file  . Stress: Not on file  Relationships  . Social Herbalist on phone: Not on file    Gets together: Not on file    Attends religious service: Not on file    Active member  of club or organization: Not on file    Attends meetings of clubs or organizations: Not on file    Relationship status: Not on file  Other Topics Concern  . Not on file  Social History Narrative  . Not on file   Family History  Problem Relation Age of Onset  . Cancer Mother   . Heart attack Father   . Stroke Father   . Heart attack Brother   . Heart attack Brother    - negative except otherwise stated in the family history section Allergies  Allergen Reactions  . Iodinated Diagnostic Agents     Other reaction(s): Fever  . Iodine-131 Other (See Comments)    Breakout, fever  . Iohexol      Code: RASH, Desc: PT STATES HOT ALL OVER HAD TO HAVE ICE PACKS ALL OVER HIM  AND DIFF BREATHING, PT REFUSED IV DYE    Prior to Admission medications   Medication Sig Start Date End Date Taking? Authorizing Provider  apixaban (ELIQUIS) 2.5 MG TABS tablet Take 1 tablet (2.5 mg total) by mouth 2 (two) times daily. 08/11/17  Yes Pixie Casino, MD  aspirin EC 81 MG EC tablet Take 1 tablet (81 mg total) by mouth daily. Stop taking aspirin 09/04/2018. 08/05/18 09/04/18 Yes Kroeger, Lorelee Cover., PA-C  atorvastatin (LIPITOR) 40 MG tablet TAKE 1 TABLET BY MOUTH ONCE DAILY Patient taking differently: Take 40 mg by mouth daily after supper.  02/17/18  Yes Hilty, Nadean Corwin, MD  calcium carbonate (TUMS - DOSED IN MG ELEMENTAL CALCIUM) 500 MG chewable tablet Chew 1 tablet by mouth as needed for indigestion or heartburn.   Yes [provider]  clopidogrel (PLAVIX) 75 MG tablet Take 1 tablet (75 mg total) by mouth daily with breakfast. 08/05/18  Yes Kroeger, Daleen Snook M., PA-C  docusate sodium (COLACE) 100 MG capsule Take 100 mg by mouth 2 (two) times daily as needed (for use with oxycodone to prevent constipation.).   Yes [provider]  dorzolamide-timolol (COSOPT) 22.3-6.8 MG/ML ophthalmic solution Place 1 drop into both eyes 2 (two) times daily.   Yes [provider]  doxazosin (CARDURA) 2 MG tablet Take 1 tablet (2 mg total) by mouth at bedtime. 07/08/18  Yes Kroeger, Daleen Snook M., PA-C  finasteride (PROSCAR) 5 MG tablet Take 5 mg by mouth every evening.    Yes [provider]  furosemide (LASIX) 40 MG tablet TAKE 1 TABLET(40 MG) BY MOUTH DAILY Patient taking differently: Take 40 mg by mouth daily. TAKE 1 TABLET(40 MG) BY MOUTH DAILY 07/08/18  Yes Kroeger, Daleen Snook M., PA-C  glipiZIDE (GLUCOTROL) 5 MG tablet Take 5 mg by mouth 2 (two) times daily.    Yes [provider]  isosorbide mononitrate (IMDUR) 30 MG 24 hr tablet Take 0.5 tablets (15 mg total) by mouth daily. 07/27/18  Yes Kroeger, Daleen Snook M., PA-C  levothyroxine (SYNTHROID, LEVOTHROID) 50 MCG tablet  Take 50 mcg by mouth daily before breakfast.  07/08/16  Yes [provider]  metoprolol tartrate (LOPRESSOR) 25 MG tablet Take 12.5 mg as needed for palpitations/heart racing Patient taking differently: Take 12.5 mg by mouth daily as needed (palpitations/heart racing). Take 12.5 mg as needed for palpitations/heart racin 07/26/17  Yes Barton Dubois, MD  Omega-3 Fatty Acids (FISH OIL PO) Take 1 capsule by mouth daily.   Yes [provider]  oxyCODONE-acetaminophen (PERCOCET) 5-325 MG tablet Take 1 tablet by mouth every 8 (eight) hours as needed for severe pain. 08/07/18  Yes Kilroy,  Luke K, PA-C  pyridOXINE (VITAMIN B-6) 100 MG tablet Take 100 mg by mouth daily.   Yes [provider]  tamsulosin (FLOMAX) 0.4 MG CAPS Take 0.4 mg by mouth 2 (two) times daily.    Yes [provider]  vitamin C (ASCORBIC ACID) 500 MG tablet Take 500 mg by mouth daily.   Yes [provider]  vitamin E 400 UNIT capsule Take 400 Units by mouth daily.   Yes [provider]  diphenhydrAMINE (BENADRYL) 50 MG tablet PLEASE FOLLOW THE INSTRUCTIONS ON YOUR AFTER VISIT SUMMARY Patient not taking: Reported on 08/20/2018 07/31/18   Lorretta Harp, MD  oxyCODONE-acetaminophen (PERCOCET) 5-325 MG tablet Take 1-2 tablets by mouth every 8 (eight) hours as needed for severe pain. Patient not taking: Reported on 08/20/2018 07/09/18   Merrily Pew, MD   Dg Foot Complete Right  Result Date: 08/20/2018 CLINICAL DATA:  Great toe pain and swelling. EXAM: RIGHT FOOT COMPLETE - 3+ VIEW COMPARISON:  05/29/2018 MRI. FINDINGS: The bony structures are intact. I do not see any definite destructive bony changes to suggest osteomyelitis. No definite findings for septic arthritis. Extensive vascular calcifications are noted. IMPRESSION: No acute bony findings or destructive bony changes. Electronically Signed   By: Marijo Sanes M.D.   On: 08/20/2018 19:13   - pertinent xrays, CT, MRI studies were  reviewed and independently interpreted  Positive ROS: All other systems have been reviewed and were otherwise negative with the exception of those mentioned in the HPI and as above.  Physical Exam: General: Alert, no acute distress Psychiatric: Patient is competent for consent with normal mood and affect Lymphatic: No axillary or cervical lymphadenopathy Cardiovascular: No pedal edema Respiratory: No cyanosis, no use of accessory musculature GI: No organomegaly, abdomen is soft and non-tender    Images:  @ENCIMAGES @  Labs:  Lab Results  Component Value Date   HGBA1C 7.0 (H) 05/29/2018   HGBA1C 8.1 (H) 07/25/2017   CRP <0.8 05/29/2018   REPTSTATUS PENDING 08/20/2018   CULT  08/20/2018    NO GROWTH < 24 HOURS Performed at [facility] [address], [place], [place] [address]     Lab Results  Component Value Date   ALBUMIN 2.0 (L) 08/21/2018   ALBUMIN 2.3 (L) 08/20/2018   ALBUMIN 4.6 05/09/2017    Neurologic: Patient does not have protective sensation bilateral lower extremities.   MUSCULOSKELETAL:   Skin: Examination patient has dry gangrenous changes to the great toe.  Midfoot has ischemic skin changes dorsally with mottled cold ischemic lesser toes and a large painful ischemic ulcer to the right heel.  Patient's calf is warm to the touch there is no ascending cellulitis.  Patient's most recent hemoglobin A1c is 7.0.  He has severe protein caloric malnutrition with an albumin of 2.0.  Patient does not have a palpable pulse reviewing the ankle-brachial indices his ABI has gone from 0.4 2.5.  Assessment: Assessment: Diabetic insensate neuropathy with severe peripheral vascular disease with dry gangrene of the right great toe ischemic gangrenous changes to the lesser toes and dorsum of the right foot with a large ischemic heel ulcer with severe protein caloric malnutrition.  Plan: Plan: I discussed with the patient he does not have sufficient dermal  circulation to the right foot or the nutritional status to heal a foot salvage intervention.  I have discussed patient's case with Dr. Gwenlyn Found.  Dr. Gwenlyn Found is planning endovascular evaluation and possible intervention on Tuesday.  Discussed that with improved  circulation to the right lower extremity he may be able to heal a transtibial amputation.  Discussed that without improvement to the right lower extremity circulation his best option would be to proceed with an above-the-knee amputation.  Discussed that with a below the knee amputation patient would be able to ambulate with a prosthesis.  With an above-the-knee amputation I do not feel patient would be able to ambulate with a prosthesis.  I will reevaluate the patient on Thursday.  Thank you for the consult and the opportunity to see [name], [place] [phone] 4:58 PM

## 2018-08-21 NOTE — Progress Notes (Signed)
ANTICOAGULATION CONSULT NOTE - Initial Consult  Pharmacy Consult for heparin Indication: atrial fibrillation  Allergies  Allergen Reactions  . Iodinated Diagnostic Agents     Other reaction(s): Fever  . Iodine-131 Other (See Comments)    Breakout, fever  . Iohexol      Code: RASH, Desc: PT STATES HOT ALL OVER HAD TO HAVE ICE PACKS ALL OVER HIM AND DIFF BREATHING, PT REFUSED IV DYE     Patient Measurements: Height: 5' 11.5" (181.6 cm) Weight: 222 lb 14.2 oz (101.1 kg) IBW/kg (Calculated) : 76.45 Heparin Dosing Weight: 96.6 kg  Vital Signs: Temp: 98.3 F (36.8 C) (08/07 0614) Temp Source: Oral (08/07 0017) BP: 113/56 (08/07 0614) Pulse Rate: 59 (08/07 0614)  Labs: Recent Labs    08/20/18 1257 08/21/18 0742  HGB 8.7* 7.7*  HCT 25.5* 22.7*  PLT 372 350  APTT  --  106*  HEPARINUNFRC  --  2.06*  CREATININE 1.41* 1.19    Estimated Creatinine Clearance: 52.4 mL/min (by C-G formula based on SCr of 1.19 mg/dL).   Medical History: Past Medical History:  Diagnosis Date  . Arthritis   . Borderline diabetic   . Glaucoma   . Hyperlipidemia   . Hypertension   . PVD (peripheral vascular disease) ([place])   . Sleep apnea    Cpap ordered but doesnt use    Medications:  Scheduled:  . aspirin EC  81 mg Oral Daily  . atorvastatin  40 mg Oral QPC supper  . clopidogrel  75 mg Oral Daily  . dorzolamide-timolol  1 drop Both Eyes BID  . doxazosin  2 mg Oral QHS  . famotidine  20 mg Oral BID  . finasteride  5 mg Oral QPM  . insulin aspart  0-9 Units Subcutaneous TID WC  . isosorbide mononitrate  15 mg Oral Daily  . levothyroxine  50 mcg Oral Q0600  . pyridOXINE  100 mg Oral Daily  . tamsulosin  0.4 mg Oral BID  . vitamin C  500 mg Oral Daily  . vitamin E  400 Units Oral Daily   Infusions:  . heparin 1,400 Units/hr (08/21/18 0343)  . piperacillin-tazobactam (ZOSYN)  IV 3.375 g (08/21/18 0424)  . vancomycin      Assessment: 24 yoM admitted for ischemic foot. On  apixaban 2.5 mg BID for Afib PTA. Last dose taken 8/6 @0930 . Pharmacy consulted 08/20/18 to dose heparin.   Initial 6 hour aPTT = 106 sec and heparin level = 2.06,  Heparin level high due to recently taking apixaban.  Will use aPTTs to guide heparin dosing until heparin levels correlate with therapeutic aPTT. CBC low, Hgb decreased to 7.7, Plt 350 stable.  RN reports some bleeding from IV site although no hematuria and no blood in BM per RN report.    Goal of Therapy:  APTT = 66-102 sec Heparin level 0.3-0.7 units/ml Monitor platelets by anticoagulation protocol: Yes   Plan:  Hold heparin drip for 30 minutes the resume heparin at a decreased rate of 1200 units/hr Check aPTT in 6 hours Monitor daily heparin levels, aPTT, CBC, and s/sx of bleeding RN to monitor IV site and notify us if oozing continues or worsens.    Thank you for involving pharmacy in this patient's care.  Nicole Cella, RPh Clinical Pharmacist [phone] Please check AMION for all [place] phone numbers After 10:00 PM, call [place] [phone] 08/21/2018,9:12 AM

## 2018-08-21 NOTE — Progress Notes (Signed)
PROGRESS NOTE    [name]  HCW:[id number]  DOB: Mar 14, 1927  DOA: 08/20/2018 PCP: [name], MD  Brief Narrative:  Per admission H&P "84 y.o. male with history of peripheral vascular disease who has recently underwent right lower extremity intervention for peripheral vascular disease presents to the ER because of worsening pain and redness of the right lower extremity.  Patient has been having a gangrenous toe which has been progressing getting worse over the last 1 month.  It has been progressing involving the whole toe now and progressing to the other part of the right foot.  Patient also noticed increasing redness involving the foot which has progressed to distal right leg. In the ER on exam patient has poor pulses in the extremities.  X-ray of the right foot does not show anything acute.  On exam patient has gangrenous looking right great toe and also some darkish areas on the plantar aspect of the foot.  Erythema involving the foot and the right distal part of the leg.  ER physician had discussed with on-call cardiologist since patient had procedure done by cardiologist 2 weeks ago.  They will be seeing patient in consult.  Also discussed with Dr. Marlou Sa on-call orthopedic surgeon.  Patient admitted for further management of cellulitis and gangrene of the right foot.  On antibiotics.  Labs show worsening anemia for which stool for occult blood was positive.  Patient has not noticed any obvious bleeding.  Other labs include sodium 124 creatinine 1.4 was given some fluid in the ER."  Subjective: Patient appears comfortable.  Saturating well on room air.  Objective: Vitals:   08/21/18 0024 08/21/18 0614 08/21/18 1000 08/21/18 1254  BP:  (!) 113/56 124/64 (!) 118/59  Pulse:  (!) 59 64 60  Resp:  16  16  Temp:  98.3 F (36.8 C)  98.2 F (36.8 C)  TempSrc:      SpO2:  95%  97%  Weight: 101.1 kg     Height: 5' 11.5" (1.816 m)       Intake/Output Summary (Last 24 hours) at 08/21/2018  2039 Last data filed at 08/21/2018 1700 Gross per 24 hour  Intake 1517.25 ml  Output 850 ml  Net 667.25 ml   Filed Weights   08/21/18 0024  Weight: 101.1 kg    Physical Examination:  General exam: Appears calm and comfortable  Respiratory system: Clear to auscultation. Respiratory effort normal. Cardiovascular system: S1 & S2 heard, RRR. No JVD, murmurs, rubs, gallops or clicks. No pedal edema. Gastrointestinal system: Abdomen is nondistended, soft and nontender. No organomegaly or masses felt. Normal bowel sounds heard. Central nervous system: Alert and oriented. No focal neurological deficits. Extremities: Symmetric 5 x 5 power. Skin: Gangrenous changes to the right foot with surrounding cellulitis as shown below.  Also has right heel wound Psychiatry: Judgement and insight appear normal. Mood & affect appropriate.          Data Reviewed: I have personally reviewed following labs and imaging studies  CBC: Recent Labs  Lab 08/20/18 1257 08/21/18 0742  WBC 13.3* 11.9*  NEUTROABS 9.9* 8.8*  HGB 8.7* 7.7*  HCT 25.5* 22.7*  MCV 92.7 90.8  PLT 372 176   Basic Metabolic Panel: Recent Labs  Lab 08/20/18 1257 08/21/18 0742  NA 124* 126*  K 3.7 3.6  CL 88* 93*  CO2 24 23  GLUCOSE 157* 147*  BUN 21 19  CREATININE 1.41* 1.19  CALCIUM 8.3* 7.9*   GFR: Estimated Creatinine Clearance:  52.4 mL/min (by C-G formula based on SCr of 1.19 mg/dL). Liver Function Tests: Recent Labs  Lab 08/20/18 1257 08/21/18 0742  AST 117* 120*  ALT 139* 132*  ALKPHOS 82 76  BILITOT 0.7 0.8  PROT 6.2* 5.3*  ALBUMIN 2.3* 2.0*   No results for input(s): LIPASE, AMYLASE in the last 168 hours. No results for input(s): AMMONIA in the last 168 hours. Coagulation Profile: No results for input(s): INR, PROTIME in the last 168 hours. Cardiac Enzymes: No results for input(s): CKTOTAL, CKMB, CKMBINDEX, TROPONINI in the last 168 hours. BNP (last 3 results) No results for input(s): PROBNP  in the last 8760 hours. HbA1C: No results for input(s): HGBA1C in the last 72 hours. CBG: Recent Labs  Lab 08/21/18 0832 08/21/18 1148 08/21/18 1620  GLUCAP 134* 140* 111*   Lipid Profile: No results for input(s): CHOL, HDL, LDLCALC, TRIG, CHOLHDL, LDLDIRECT in the last 72 hours. Thyroid Function Tests: No results for input(s): TSH, T4TOTAL, FREET4, T3FREE, THYROIDAB in the last 72 hours. Anemia Panel: No results for input(s): VITAMINB12, FOLATE, FERRITIN, TIBC, IRON, RETICCTPCT in the last 72 hours. Sepsis Labs: Recent Labs  Lab 08/20/18 1257 08/20/18 1735  LATICACIDVEN 1.7 1.8    Recent Results (from the past 240 hour(s))  Culture, blood (routine x 2)     Status: None (Preliminary result)   Collection Time: 08/20/18  5:35 PM   Specimen: BLOOD  Result Value Ref Range Status   Specimen Description BLOOD RIGHT ANTECUBITAL  Final   Special Requests   Final    BOTTLES DRAWN AEROBIC AND ANAEROBIC Blood Culture adequate volume   Culture   Final    NO GROWTH < 24 HOURS Performed at [facility], [place] [address] [place], [place] [address]    Report Status PENDING  Incomplete  Culture, blood (routine x 2)     Status: None (Preliminary result)   Collection Time: 08/20/18  6:01 PM   Specimen: BLOOD  Result Value Ref Range Status   Specimen Description BLOOD RIGHT ANTECUBITAL  Final   Special Requests   Final    BOTTLES DRAWN AEROBIC AND ANAEROBIC Blood Culture adequate volume   Culture   Final    NO GROWTH < 24 HOURS Performed at [facility], [place] [address], [place],  [address]    Report Status PENDING  Incomplete  SARS Coronavirus 2 [facility] order, Performed in [facility] hospital lab) Nasopharyngeal Urine, Clean Catch     Status: None   Collection Time: 08/20/18  8:17 PM   Specimen: Urine, Clean Catch; Nasopharyngeal  Result Value Ref Range Status   SARS Coronavirus 2 NEGATIVE NEGATIVE Final    Comment: (NOTE) If result is NEGATIVE SARS-CoV-2  target nucleic acids are NOT DETECTED. The SARS-CoV-2 RNA is generally detectable in upper and lower  respiratory specimens during the acute phase of infection. The lowest  concentration of SARS-CoV-2 viral copies this assay can detect is 250  copies / mL. A negative result does not preclude SARS-CoV-2 infection  and should not be used as the sole basis for treatment or other  patient management decisions.  A negative result may occur with  improper specimen collection / handling, submission of specimen other  than nasopharyngeal swab, presence of viral mutation(s) within the  areas targeted by this assay, and inadequate number of viral copies  (<250 copies / mL). A negative result must be combined with clinical  observations, patient history, and epidemiological information. If result is POSITIVE  SARS-CoV-2 target nucleic acids are DETECTED. The SARS-CoV-2 RNA is generally detectable in upper and lower  respiratory specimens dur ing the acute phase of infection.  Positive  results are indicative of active infection with SARS-CoV-2.  Clinical  correlation with patient history and other diagnostic information is  necessary to determine patient infection status.  Positive results do  not rule out bacterial infection or co-infection with other viruses. If result is PRESUMPTIVE POSTIVE SARS-CoV-2 nucleic acids MAY BE PRESENT.   A presumptive positive result was obtained on the submitted specimen  and confirmed on repeat testing.  While 2019 novel coronavirus  (SARS-CoV-2) nucleic acids may be present in the submitted sample  additional confirmatory testing may be necessary for epidemiological  and / or clinical management purposes  to differentiate between  SARS-CoV-2 and other Sarbecovirus currently known to infect humans.  If clinically indicated additional testing with an alternate test  methodology [phone]) is advised. The SARS-CoV-2 RNA is generally  detectable in upper and lower  respiratory sp ecimens during the acute  phase of infection. The expected result is Negative. Fact Sheet for Patients:  StrictlyIdeas.no Fact Sheet for Healthcare Providers: BankingDealers.co.za This test is not yet approved or cleared by the [place] FDA and has been authorized for detection and/or diagnosis of SARS-CoV-2 by FDA under an Emergency Use Authorization (EUA).  This EUA will remain in effect (meaning this test can be used) for the duration of the COVID-19 declaration under Section 564(b)(1) of the Act, 21 U.S.C. section 360bbb-3(b)(1), unless the authorization is terminated or revoked sooner. Performed at [facility], [place] [address] [place], [place] [address]       Radiology Studies: Dg Foot Complete Right  Result Date: 08/20/2018 CLINICAL DATA:  Great toe pain and swelling. EXAM: RIGHT FOOT COMPLETE - 3+ VIEW COMPARISON:  05/29/2018 MRI. FINDINGS: The bony structures are intact. I do not see any definite destructive bony changes to suggest osteomyelitis. No definite findings for septic arthritis. Extensive vascular calcifications are noted. IMPRESSION: No acute bony findings or destructive bony changes. Electronically Signed   By: Marijo Sanes M.D.   On: 08/20/2018 19:13        Scheduled Meds: . aspirin EC  81 mg Oral Daily  . atorvastatin  40 mg Oral QPC supper  . clopidogrel  75 mg Oral Daily  . dorzolamide-timolol  1 drop Both Eyes BID  . doxazosin  2 mg Oral QHS  . famotidine  20 mg Oral BID  . finasteride  5 mg Oral QPM  . insulin aspart  0-9 Units Subcutaneous TID WC  . isosorbide mononitrate  15 mg Oral Daily  . levothyroxine  50 mcg Oral Q0600  . pyridOXINE  100 mg Oral Daily  . tamsulosin  0.4 mg Oral BID  . vitamin C  500 mg Oral Daily  . vitamin E  400 Units Oral Daily   Continuous Infusions: . heparin 1,200 Units/hr (08/21/18 1036)  . piperacillin-tazobactam (ZOSYN)  IV 3.375 g  (08/21/18 2003)  . vancomycin      Assessment & Plan:   1. Right lower extremity cellulitis with gangrenous toes and peripheral vascular disease status post recent procedure done 2 weeks ago -cardiologist and orthopedic surgeon has been consulted patient is on empiric antibiotics.  In anticipation of procedure holding Eliquis and  patient started on heparin.  Patient is also on antiplatelet agents.  Patient planned for endovascular evaluation and possible intervention on Tuesday.  If circulation improves, may be  candidate for transtibial amputation.  If not may end up needing AKA. 2. Proximal atrial fibrillation on no rate limiting medications since history of bradycardia.  On heparin at this time in anticipation of procedure and Eliquis on hold 3. Worsening anemia with stool for occult blood positive.  Closely follow CBC. 4. Diabetes mellitus type 2 we will keep patient on sliding scale coverage. 5. Hypothyroidism Synthroid. 6. History of BPH we will continue home medication. 7. History of moderate aortic valve stenosis -monitor for now    DVT prophylaxis: Anticoagulation as above Code Status: Full code Family / Patient Communication: Discussed with patient Disposition Plan: TBD     LOS: 0 days    Time spent: 32 minutes    Gu[place]ford Shi, MD [facility] Pager [phone]  If 7PM-7AM, please contact night-coverage [email or web address] Password [facility] 08/21/2018, 8:39 PM

## 2018-08-21 NOTE — Progress Notes (Signed)
Orthopedic team made aware of patient and familiarized with pt via chart review. Dr. Sharol Given will evaluate patient later today.    Luke Andreana Klingerman PA-C 1:08 AM 08/21/2018

## 2018-08-21 NOTE — Progress Notes (Signed)
Pharmacy Antibiotic Note  [name] is a 85 y.o. male admitted on 08/20/2018 with cellulitis. Gangrenous ulcer on right great toe and other ulcer spreading on right lower extremity. Hx of critical limb ischemia s/p stenting of SFA. Pharmacy consulted on 08/20/18 for vancomycin dosing.  Afebrile,  WBC elevated 13.3> 11.9. Scr has improved, down to 1.19 today. Baseline ~1.25.  Plan: Adjust Vancomycin  To 1000 mg IV Q 24 hrs. Goal AUC 400-550. Expected AUC: 461 SCr used: 1.19 Monitor cxs, renal fxn, clinical improvement, and abx de-escalation as needed  Height: 5' 11.5" (181.6 cm) Weight: 222 lb 14.2 oz (101.1 kg) IBW/kg (Calculated) : 76.45  Temp (24hrs), Avg:97.8 F (36.6 C), Min:97.5 F (36.4 C), Max:98.3 F (36.8 C)  Recent Labs  Lab 08/20/18 1257 08/20/18 1735 08/21/18 0742  WBC 13.3*  --  11.9*  CREATININE 1.41*  --  1.19  LATICACIDVEN 1.7 1.8  --     Estimated Creatinine Clearance: 52.4 mL/min (by C-G formula based on SCr of 1.19 mg/dL).    Allergies  Allergen Reactions  . Iodinated Diagnostic Agents     Other reaction(s): Fever  . Iodine-131 Other (See Comments)    Breakout, fever  . Iohexol      Code: RASH, Desc: PT STATES HOT ALL OVER HAD TO HAVE ICE PACKS ALL OVER HIM AND DIFF BREATHING, PT REFUSED IV DYE     Antimicrobials this admission: 8/6 Zosyn >> 8/6 Vanc >>  Microbiology results: 8/6 BCx: ngtd 8/6 Covid: negative   Thank you for allowing pharmacy to be a part of this patient's care.  Nicole Cella, RPh Clinical Pharmacist [phone] Please check AMION for all [place] phone numbers After 10:00 PM, call [place] [phone] 08/21/2018 10:09 AM

## 2018-08-22 LAB — CBC
HCT: 23.5 % — ABNORMAL LOW (ref 39.0–52.0)
Hemoglobin: 7.9 g/dL — ABNORMAL LOW (ref 13.0–17.0)
MCH: 31.2 pg (ref 26.0–34.0)
MCHC: 33.6 g/dL (ref 30.0–36.0)
MCV: 92.9 fL (ref 80.0–100.0)
Platelets: 356 10*3/uL (ref 150–400)
RBC: 2.53 MIL/uL — ABNORMAL LOW (ref 4.22–5.81)
RDW: 13.6 % (ref 11.5–15.5)
WBC: 12.5 10*3/uL — ABNORMAL HIGH (ref 4.0–10.5)
nRBC: 0 % (ref 0.0–0.2)

## 2018-08-22 LAB — HEPARIN LEVEL (UNFRACTIONATED): Heparin Unfractionated: 1.12 IU/mL — ABNORMAL HIGH (ref 0.30–0.70)

## 2018-08-22 LAB — CREATININE, SERUM
Creatinine, Ser: 1.34 mg/dL — ABNORMAL HIGH (ref 0.61–1.24)
GFR calc Af Amer: 54 mL/min — ABNORMAL LOW (ref >60)
GFR calc non Af Amer: 47 mL/min — ABNORMAL LOW (ref >60)

## 2018-08-22 LAB — APTT
aPTT: 105 seconds — ABNORMAL HIGH (ref 24–36)
aPTT: 80 seconds — ABNORMAL HIGH (ref 24–36)

## 2018-08-22 LAB — GLUCOSE, CAPILLARY
Glucose-Capillary: 154 mg/dL — ABNORMAL HIGH (ref 70–99)
Glucose-Capillary: 155 mg/dL — ABNORMAL HIGH (ref 70–99)
Glucose-Capillary: 164 mg/dL — ABNORMAL HIGH (ref 70–99)
Glucose-Capillary: 172 mg/dL — ABNORMAL HIGH (ref 70–99)
Glucose-Capillary: 175 mg/dL — ABNORMAL HIGH (ref 70–99)

## 2018-08-22 MED ORDER — SODIUM CHLORIDE 0.9 % IV SOLN
1.0000 g | Freq: Three times a day (TID) | INTRAVENOUS | Status: DC
  Administered 2018-08-22 – 2018-08-23 (×4): 1 g via INTRAVENOUS
  Filled 2018-08-22 (×7): qty 1

## 2018-08-22 MED ORDER — HEPARIN (PORCINE) 25000 UT/250ML-% IV SOLN
900.0000 [IU]/h | INTRAVENOUS | Status: DC
  Administered 2018-08-22 – 2018-08-23 (×2): 900 [IU]/h via INTRAVENOUS
  Administered 2018-08-24: 800 [IU]/h via INTRAVENOUS
  Filled 2018-08-22 (×2): qty 250

## 2018-08-22 NOTE — Progress Notes (Signed)
ANTICOAGULATION CONSULT NOTE - Follow Up Consult  Pharmacy Consult for Heparin Indication: atrial fibrillation  Allergies  Allergen Reactions  . Iodinated Diagnostic Agents     Other reaction(s): Fever  . Iodine-131 Other (See Comments)    Breakout, fever  . Iohexol      Code: RASH, Desc: PT STATES HOT ALL OVER HAD TO HAVE ICE PACKS ALL OVER HIM AND DIFF BREATHING, PT REFUSED IV DYE     Patient Measurements: Height: 5' 11.5" (181.6 cm) Weight: 222 lb 14.2 oz (101.1 kg) IBW/kg (Calculated) : 76.45  Vital Signs: BP: 103/47 (08/07 2127) Pulse Rate: 62 (08/07 2127)  Labs: Recent Labs    08/20/18 1257 08/21/18 0742 08/21/18 1623 08/22/18 0228  HGB 8.7* 7.7*  --  7.9*  HCT 25.5* 22.7*  --  23.5*  PLT 372 350  --  356  APTT  --  106* 105* 105*  HEPARINUNFRC  --  2.06*  --  1.12*  CREATININE 1.41* 1.19  --  1.34*    Estimated Creatinine Clearance: 46.5 mL/min (A) (by C-G formula based on SCr of 1.34 mg/dL (H)).  Anticoag: On apixaban 2.5 mg BID for Afib PTA. Last dose taken 8/6 @0930 .  - APTT 105 sec, HL 1.12.  Some continued bleeding per RN requiring holding pressure.  Hg 7.9, PTLC 356   Goal of Therapy:  aPTT 66-102 seconds Monitor platelets by anticoagulation protocol: Yes   Plan:  Decrease IV heparin to 900units/hr APTT in 6 hours  Thanks for allowing pharmacy to be a part of this patient's care.  Excell Seltzer, PharmD Clinical Pharmacist 08/22/2018,5:50 AM

## 2018-08-22 NOTE — Progress Notes (Addendum)
PROGRESS NOTE    [name]  KYH:[id number]  DOB: 12-Dec-1927  DOA: 08/20/2018 PCP: [name], MD  Brief Narrative:  Per admission H&P "85 y.o. male with history of peripheral vascular disease who has recently underwent right lower extremity intervention for peripheral vascular disease presents to the ER because of worsening pain and redness of the right lower extremity.  Patient has been having a gangrenous toe which has been progressing getting worse over the last 1 month.  It has been progressing involving the whole toe now and progressing to the other part of the right foot.  Patient also noticed increasing redness involving the foot which has progressed to distal right leg. In the ER on exam patient has poor pulses in the extremities.  X-ray of the right foot does not show anything acute.  On exam patient has gangrenous looking right great toe and also some darkish areas on the plantar aspect of the foot.  Erythema involving the foot and the right distal part of the leg.  ER physician had discussed with on-call cardiologist since patient had procedure done by cardiologist 2 weeks ago.  They will be seeing patient in consult.  Also discussed with Dr. Marlou Sa on-call orthopedic surgeon.  Patient admitted for further management of cellulitis and gangrene of the right foot.  On antibiotics.  Labs show worsening anemia for which stool for occult blood was positive.  Patient has not noticed any obvious bleeding.  Other labs include sodium 124 creatinine 1.4 was given some fluid in the ER."  Subjective: Patient appears comfortable.  Saturating well on room air. SBP 104-120  Objective: Vitals:   08/21/18 1000 08/21/18 1254 08/21/18 2127 08/22/18 0622  BP: 124/64 (!) 118/59 (!) 103/47 (!) 104/44  Pulse: 64 60 62 71  Resp:  16 16 (!) 24  Temp:  98.2 F (36.8 C)  99.2 F (37.3 C)  TempSrc:    Oral  SpO2:  97% 99% 99%  Weight:      Height:        Intake/Output Summary (Last 24 hours) at  08/22/2018 1325 Last data filed at 08/22/2018 1300 Gross per 24 hour  Intake 1058.23 ml  Output 2550 ml  Net -1491.77 ml   Filed Weights   08/21/18 0024  Weight: 101.1 kg    Physical Examination:  General exam: Appears calm and comfortable  Respiratory system: Clear to auscultation. Respiratory effort normal. Cardiovascular system: S1 & S2 heard, RRR. No JVD, murmurs, rubs, gallops or clicks. No pedal edema. Gastrointestinal system: Abdomen is nondistended, soft and nontender. No organomegaly or masses felt. Normal bowel sounds heard. Central nervous system: Alert and oriented. No focal neurological deficits. Extremities: Symmetric 5 x 5 power. Skin: Gangrenous changes to the right foot with surrounding cellulitis as shown below.  Also has right heel wound Psychiatry: Judgement and insight appear normal. Mood & affect appropriate.          Data Reviewed: I have personally reviewed following labs and imaging studies  CBC: Recent Labs  Lab 08/20/18 1257 08/21/18 0742 08/22/18 0228  WBC 13.3* 11.9* 12.5*  NEUTROABS 9.9* 8.8*  --   HGB 8.7* 7.7* 7.9*  HCT 25.5* 22.7* 23.5*  MCV 92.7 90.8 92.9  PLT 372 350 151   Basic Metabolic Panel: Recent Labs  Lab 08/20/18 1257 08/21/18 0742 08/22/18 0228  NA 124* 126*  --   K 3.7 3.6  --   CL 88* 93*  --   CO2 24 23  --  GLUCOSE 157* 147*  --   BUN 21 19  --   CREATININE 1.41* 1.19 1.34*  CALCIUM 8.3* 7.9*  --    GFR: Estimated Creatinine Clearance: 46.5 mL/min (A) (by C-G formula based on SCr of 1.34 mg/dL (H)). Liver Function Tests: Recent Labs  Lab 08/20/18 1257 08/21/18 0742  AST 117* 120*  ALT 139* 132*  ALKPHOS 82 76  BILITOT 0.7 0.8  PROT 6.2* 5.3*  ALBUMIN 2.3* 2.0*   No results for input(s): LIPASE, AMYLASE in the last 168 hours. No results for input(s): AMMONIA in the last 168 hours. Coagulation Profile: No results for input(s): INR, PROTIME in the last 168 hours. Cardiac Enzymes: No results for  input(s): CKTOTAL, CKMB, CKMBINDEX, TROPONINI in the last 168 hours. BNP (last 3 results) No results for input(s): PROBNP in the last 8760 hours. HbA1C: No results for input(s): HGBA1C in the last 72 hours. CBG: Recent Labs  Lab 08/21/18 1148 08/21/18 1620 08/22/18 0024 08/22/18 0809 08/22/18 1300  GLUCAP 140* 111* 155* 154* 164*   Lipid Profile: No results for input(s): CHOL, HDL, LDLCALC, TRIG, CHOLHDL, LDLDIRECT in the last 72 hours. Thyroid Function Tests: No results for input(s): TSH, T4TOTAL, FREET4, T3FREE, THYROIDAB in the last 72 hours. Anemia Panel: No results for input(s): VITAMINB12, FOLATE, FERRITIN, TIBC, IRON, RETICCTPCT in the last 72 hours. Sepsis Labs: Recent Labs  Lab 08/20/18 1257 08/20/18 1735  LATICACIDVEN 1.7 1.8    Recent Results (from the past 240 hour(s))  Culture, blood (routine x 2)     Status: None (Preliminary result)   Collection Time: 08/20/18  5:35 PM   Specimen: BLOOD  Result Value Ref Range Status   Specimen Description BLOOD RIGHT ANTECUBITAL  Final   Special Requests   Final    BOTTLES DRAWN AEROBIC AND ANAEROBIC Blood Culture adequate volume   Culture   Final    NO GROWTH 2 DAYS Performed at [facility], [place] [address] [place], [place] [address]    Report Status PENDING  Incomplete  Culture, blood (routine x 2)     Status: None (Preliminary result)   Collection Time: 08/20/18  6:01 PM   Specimen: BLOOD  Result Value Ref Range Status   Specimen Description BLOOD RIGHT ANTECUBITAL  Final   Special Requests   Final    BOTTLES DRAWN AEROBIC AND ANAEROBIC Blood Culture adequate volume   Culture   Final    NO GROWTH 2 DAYS Performed at [facility], [place] [address], [place], [place] [address]    Report Status PENDING  Incomplete  SARS Coronavirus 2 [facility] order, Performed in [facility] hospital lab) Nasopharyngeal Urine, Clean Catch     Status: None   Collection Time: 08/20/18  8:17 PM   Specimen: Urine, Clean  Catch; Nasopharyngeal  Result Value Ref Range Status   SARS Coronavirus 2 NEGATIVE NEGATIVE Final    Comment: (NOTE) If result is NEGATIVE SARS-CoV-2 target nucleic acids are NOT DETECTED. The SARS-CoV-2 RNA is generally detectable in upper and lower  respiratory specimens during the acute phase of infection. The lowest  concentration of SARS-CoV-2 viral copies this assay can detect is 250  copies / mL. A negative result does not preclude SARS-CoV-2 infection  and should not be used as the sole basis for treatment or other  patient management decisions.  A negative result may occur with  improper specimen collection / handling, submission of specimen other  than nasopharyngeal swab, presence of viral mutation(s) within  the  areas targeted by this assay, and inadequate number of viral copies  (<250 copies / mL). A negative result must be combined with clinical  observations, patient history, and epidemiological information. If result is POSITIVE SARS-CoV-2 target nucleic acids are DETECTED. The SARS-CoV-2 RNA is generally detectable in upper and lower  respiratory specimens dur ing the acute phase of infection.  Positive  results are indicative of active infection with SARS-CoV-2.  Clinical  correlation with patient history and other diagnostic information is  necessary to determine patient infection status.  Positive results do  not rule out bacterial infection or co-infection with other viruses. If result is PRESUMPTIVE POSTIVE SARS-CoV-2 nucleic acids MAY BE PRESENT.   A presumptive positive result was obtained on the submitted specimen  and confirmed on repeat testing.  While 2019 novel coronavirus  (SARS-CoV-2) nucleic acids may be present in the submitted sample  additional confirmatory testing may be necessary for epidemiological  and / or clinical management purposes  to differentiate between  SARS-CoV-2 and other Sarbecovirus currently known to infect humans.  If clinically  indicated additional testing with an alternate test  methodology [phone]) is advised. The SARS-CoV-2 RNA is generally  detectable in upper and lower respiratory sp ecimens during the acute  phase of infection. The expected result is Negative. Fact Sheet for Patients:  StrictlyIdeas.no Fact Sheet for Healthcare Providers: BankingDealers.co.za This test is not yet approved or cleared by the [place] FDA and has been authorized for detection and/or diagnosis of SARS-CoV-2 by FDA under an Emergency Use Authorization (EUA).  This EUA will remain in effect (meaning this test can be used) for the duration of the COVID-19 declaration under Section 564(b)(1) of the Act, 21 U.S.C. section 360bbb-3(b)(1), unless the authorization is terminated or revoked sooner. Performed at [facility], [place] [address], [place], [place] [address]       Radiology Studies: Dg Foot Complete Right  Result Date: 08/20/2018 CLINICAL DATA:  Great toe pain and swelling. EXAM: RIGHT FOOT COMPLETE - 3+ VIEW COMPARISON:  05/29/2018 MRI. FINDINGS: The bony structures are intact. I do not see any definite destructive bony changes to suggest osteomyelitis. No definite findings for septic arthritis. Extensive vascular calcifications are noted. IMPRESSION: No acute bony findings or destructive bony changes. Electronically Signed   By: Marijo Sanes M.D.   On: 08/20/2018 19:13        Scheduled Meds:  aspirin EC  81 mg Oral Daily   atorvastatin  40 mg Oral QPC supper   clopidogrel  75 mg Oral Daily   dorzolamide-timolol  1 drop Both Eyes BID   doxazosin  2 mg Oral QHS   famotidine  20 mg Oral BID   finasteride  5 mg Oral QPM   insulin aspart  0-9 Units Subcutaneous TID WC   isosorbide mononitrate  15 mg Oral Daily   levothyroxine  50 mcg Oral Q0600   pyridOXINE  100 mg Oral Daily   tamsulosin  0.4 mg Oral BID   vitamin C  500 mg Oral Daily    vitamin E  400 Units Oral Daily   Continuous Infusions:  ceFEPime (MAXIPIME) IV 1 g (08/22/18 0933)   heparin 900 Units/hr (08/22/18 0600)   vancomycin 1,000 mg (08/22/18 0013)    Assessment & Plan:   1. Right lower extremity cellulitis with gangrenous toes:  -cardiologist and orthopedic surgeon has been consulted patient . Patient admitted with empiric antibiotics-vancomycin/zosyn. Changed to cefepime/vancomycin to avoid renal toxicity.  In anticipation of procedure holding Eliquis and  patient started on heparin.  Patient is also on antiplatelet agents.  Patient planned for endovascular evaluation and possible intervention on Tuesday.  If circulation improves, may be candidate for transtibial amputation.  If not may end up needing AKA.  2. Peripheral vascular disease status post recent procedure done 2 weeks ago. On asa/plavix/anticoagulation/statins.  Patient planned for endovascular evaluation and possible intervention on Tuesday. Last seen by Dr Gwenlyn Found on 7/17 and note as below:     " Mr. Kakar returns today after his diagnostic angiogram which I performed 07/27/2018 for critical limb   ischemia.  I did place a left SFA stent in him in May 2012 which has remained patent although he does have significant disease beyond the SFA stent and 0 vessel runoff bilaterally.  He had a 99% calcified right above-the-knee popliteal artery stenosis which I did not intervene on because I used 135 cc of contrast and was worried about renal insufficiency.  He was discharged home the same day.  He does complain of some pain in his right foot and has a right great toe wound which occurred after his toenail was removed.  His ABI on that side fell from 0.82 down 0.43 on 07/21/2018.  My plan is to perform diamondback orbital rotational atherectomy, PTA possible stenting of his distal right SFA/popliteal artery to improve collaterals.  He may need tibial pedal access subsequent to that establish inline flow to promote  healing"  3.  Proximal atrial fibrillation on no rate limiting medications since history of bradycardia.  On heparin at this time in anticipation of procedure and Eliquis on hold  4. Acute on Chronic anemia with stool for occult blood positive.  Closely follow CBC as patient on dual antiplatelet therapy and Eliquis.  5. CKD stage 2-3 : Baseline creatinine appears to be around 1.4. Monitor closely. Abx regimen changed as above.   6. Hypertension: On Imdur/Doxazosin.Hold Imdur given low BP  7.  Hypothyroidism Synthroid.  8. History of BPH : tamsulosin/finasteride and doxazosin.   9. History of moderate aortic valve stenosis -monitor for now  10. Diabetes mellitus type 2:  on sliding scale coverage. BG [phone]  Addendum: Bleeding from IV site. Hold Plavix (might need holding for surgery anyway) and Heparin for few hours as d/w pharmacy  DVT prophylaxis: Anticoagulation as above Code Status: Full code Family / Patient Communication: Discussed with patient Disposition Plan: TBD     LOS: 1 day    Time spent: 56 minutes    Guilford Shi, MD [facility] Pager [phone]  If 7PM-7AM, please contact night-coverage [email or web address] Password [email or web address] 08/22/2018, 1:25 PM

## 2018-08-22 NOTE — Progress Notes (Addendum)
ANTICOAGULATION CONSULT NOTE - Follow Up  Pharmacy Consult for heparin Indication: atrial fibrillation  Allergies  Allergen Reactions  . Iodinated Diagnostic Agents     Other reaction(s): Fever  . Iodine-131 Other (See Comments)    Breakout, fever  . Iohexol      Code: RASH, Desc: PT STATES HOT ALL OVER HAD TO HAVE ICE PACKS ALL OVER HIM AND DIFF BREATHING, PT REFUSED IV DYE     Patient Measurements: Height: 5' 11.5" (181.6 cm) Weight: 222 lb 14.2 oz (101.1 kg) IBW/kg (Calculated) : 76.45 Heparin Dosing Weight: 96.6 kg  Vital Signs: Temp: 98.2 F (36.8 C) (08/08 1415) Temp Source: Oral (08/08 1415) BP: 114/56 (08/08 1415) Pulse Rate: 68 (08/08 1415)  Labs: Recent Labs    08/20/18 1257  08/21/18 0742 08/21/18 1623 08/22/18 0228 08/22/18 1210  HGB 8.7*  --  7.7*  --  7.9*  --   HCT 25.5*  --  22.7*  --  23.5*  --   PLT 372  --  350  --  356  --   APTT  --    < > 106* 105* 105* 80*  HEPARINUNFRC  --   --  2.06*  --  1.12*  --   CREATININE 1.41*  --  1.19  --  1.34*  --    < > = values in this interval not displayed.    Estimated Creatinine Clearance: 46.5 mL/min (A) (by C-G formula based on SCr of 1.34 mg/dL (H)).   Medical History: Past Medical History:  Diagnosis Date  . Arthritis   . Borderline diabetic   . Glaucoma   . Hyperlipidemia   . Hypertension   . PVD (peripheral vascular disease) ([place])   . Sleep apnea    Cpap ordered but doesnt use    Medications:  Scheduled:  . aspirin EC  81 mg Oral Daily  . atorvastatin  40 mg Oral QPC supper  . dorzolamide-timolol  1 drop Both Eyes BID  . doxazosin  2 mg Oral QHS  . famotidine  20 mg Oral BID  . finasteride  5 mg Oral QPM  . insulin aspart  0-9 Units Subcutaneous TID WC  . levothyroxine  50 mcg Oral Q0600  . pyridOXINE  100 mg Oral Daily  . tamsulosin  0.4 mg Oral BID  . vitamin C  500 mg Oral Daily  . vitamin E  400 Units Oral Daily   Infusions:  . ceFEPime (MAXIPIME) IV 1 g (08/22/18 0933)   . heparin    . vancomycin 1,000 mg (08/22/18 0013)    Assessment: 15 yoM admitted for ischemic foot. On apixaban 2.5 mg BID for Afib PTA. Last dose taken 8/6 @0930 . Pharmacy consulted 08/20/18 to dose heparin.  APTT this afternoon was therapeutic at 80 seconds. The nurse stated that the patient was having bleeding at the old puncture site on the right arm which was soaking bandages. Hgb 7.7 >>7.9, Plt WNL.    Goal of Therapy:  APTT = 66-102 sec Heparin level 0.3-0.7 units/ml Monitor platelets by anticoagulation protocol: Yes   Plan:  - Hold heparin infusion for 4 hrs & Plavix for now per Dr. Lenoria Chime recommendation - Resume heparin at 1800 today at same rate, 900 units/hr - Check aPTT in 8 hrs - Monitor daily heparin levels, CBC, and s/sx of bleeding  Thank you for involving pharmacy in this patient's care.  [name], PharmD PGY1 Pharmacy Resident [phone]

## 2018-08-23 LAB — CREATININE, SERUM
Creatinine, Ser: 1.27 mg/dL — ABNORMAL HIGH (ref 0.61–1.24)
GFR calc Af Amer: 58 mL/min — ABNORMAL LOW (ref >60)
GFR calc non Af Amer: 50 mL/min — ABNORMAL LOW (ref >60)

## 2018-08-23 LAB — BASIC METABOLIC PANEL
Anion gap: 10 (ref 5–15)
BUN: 16 mg/dL (ref 8–23)
CO2: 23 mmol/L (ref 22–32)
Calcium: 8.2 mg/dL — ABNORMAL LOW (ref 8.9–10.3)
Chloride: 94 mmol/L — ABNORMAL LOW (ref 98–111)
Creatinine, Ser: 1.26 mg/dL — ABNORMAL HIGH (ref 0.61–1.24)
GFR calc Af Amer: 59 mL/min — ABNORMAL LOW (ref >60)
GFR calc non Af Amer: 51 mL/min — ABNORMAL LOW (ref >60)
Glucose, Bld: 171 mg/dL — ABNORMAL HIGH (ref 70–99)
Potassium: 3.8 mmol/L (ref 3.5–5.1)
Sodium: 127 mmol/L — ABNORMAL LOW (ref 135–145)

## 2018-08-23 LAB — CBC
HCT: 23.7 % — ABNORMAL LOW (ref 39.0–52.0)
Hemoglobin: 8 g/dL — ABNORMAL LOW (ref 13.0–17.0)
MCH: 30.9 pg (ref 26.0–34.0)
MCHC: 33.8 g/dL (ref 30.0–36.0)
MCV: 91.5 fL (ref 80.0–100.0)
Platelets: 397 10*3/uL (ref 150–400)
RBC: 2.59 MIL/uL — ABNORMAL LOW (ref 4.22–5.81)
RDW: 13.6 % (ref 11.5–15.5)
WBC: 13.9 10*3/uL — ABNORMAL HIGH (ref 4.0–10.5)
nRBC: 0 % (ref 0.0–0.2)

## 2018-08-23 LAB — APTT
aPTT: 66 seconds — ABNORMAL HIGH (ref 24–36)
aPTT: 100 seconds — ABNORMAL HIGH (ref 24–36)
aPTT: 68 seconds — ABNORMAL HIGH (ref 24–36)

## 2018-08-23 LAB — HEPARIN LEVEL (UNFRACTIONATED)
Heparin Unfractionated: 0.74 IU/mL — ABNORMAL HIGH (ref 0.30–0.70)
Heparin Unfractionated: 0.41 IU/mL (ref 0.30–0.70)

## 2018-08-23 LAB — GLUCOSE, CAPILLARY
Glucose-Capillary: 167 mg/dL — ABNORMAL HIGH (ref 70–99)
Glucose-Capillary: 163 mg/dL — ABNORMAL HIGH (ref 70–99)
Glucose-Capillary: 160 mg/dL — ABNORMAL HIGH (ref 70–99)
Glucose-Capillary: 168 mg/dL — ABNORMAL HIGH (ref 70–99)

## 2018-08-23 MED ORDER — SODIUM CHLORIDE 0.9 % IV SOLN
2.0000 g | Freq: Two times a day (BID) | INTRAVENOUS | Status: DC
  Administered 2018-08-23 – 2018-08-24 (×2): 2 g via INTRAVENOUS
  Filled 2018-08-23 (×2): qty 2

## 2018-08-23 MED ORDER — CLINDAMYCIN PHOSPHATE 600 MG/50ML IV SOLN
600.0000 mg | Freq: Three times a day (TID) | INTRAVENOUS | Status: DC
  Administered 2018-08-23 – 2018-08-24 (×5): 600 mg via INTRAVENOUS
  Filled 2018-08-23 (×5): qty 50

## 2018-08-23 NOTE — Progress Notes (Signed)
ANTICOAGULATION CONSULT NOTE - Follow Up  Pharmacy Consult for heparin Indication: atrial fibrillation  Allergies  Allergen Reactions  . Iodinated Diagnostic Agents     Other reaction(s): Fever  . Iodine-131 Other (See Comments)    Breakout, fever  . Iohexol      Code: RASH, Desc: PT STATES HOT ALL OVER HAD TO HAVE ICE PACKS ALL OVER HIM AND DIFF BREATHING, PT REFUSED IV DYE     Patient Measurements: Height: 5' 11.5" (181.6 cm) Weight: 222 lb 14.2 oz (101.1 kg) IBW/kg (Calculated) : 76.45 Heparin Dosing Weight: 96.6 kg  Vital Signs: Temp: 98 F (36.7 C) (08/08 2315) Temp Source: Oral (08/08 2118) BP: 131/66 (08/08 2315) Pulse Rate: 64 (08/08 2315)  Labs: Recent Labs    08/21/18 0742  08/22/18 0228 08/22/18 1210 08/23/18 0222  HGB 7.7*  --  7.9*  --  8.0*  HCT 22.7*  --  23.5*  --  23.7*  PLT 350  --  356  --  397  APTT 106*   < > 105* 80* 66*  HEPARINUNFRC 2.06*  --  1.12*  --   --   CREATININE 1.19  --  1.34*  --  1.27*   < > = values in this interval not displayed.    Estimated Creatinine Clearance: 49.1 mL/min (A) (by C-G formula based on SCr of 1.27 mg/dL (H)).   Medical History: Past Medical History:  Diagnosis Date  . Arthritis   . Borderline diabetic   . Glaucoma   . Hyperlipidemia   . Hypertension   . PVD (peripheral vascular disease) ([place])   . Sleep apnea    Cpap ordered but doesnt use    Medications:  Scheduled:  . aspirin EC  81 mg Oral Daily  . atorvastatin  40 mg Oral QPC supper  . dorzolamide-timolol  1 drop Both Eyes BID  . doxazosin  2 mg Oral QHS  . famotidine  20 mg Oral BID  . finasteride  5 mg Oral QPM  . insulin aspart  0-9 Units Subcutaneous TID WC  . levothyroxine  50 mcg Oral Q0600  . pyridOXINE  100 mg Oral Daily  . tamsulosin  0.4 mg Oral BID  . vitamin C  500 mg Oral Daily  . vitamin E  400 Units Oral Daily   Infusions:  . ceFEPime (MAXIPIME) IV 1 g (08/22/18 2110)  . heparin 900 Units/hr (08/22/18 1803)  .  vancomycin 1,000 mg (08/22/18 2244)    Assessment: 32 yoM admitted for ischemic foot. On apixaban 2.5 mg BID for Afib PTA. Last dose taken 8/6 @0930 . Pharmacy consulted 08/20/18 to dose heparin.  APTT now 66 sec Hg 8.0  Goal of Therapy:  APTT = 66-102 sec Heparin level 0.3-0.7 units/ml Monitor platelets by anticoagulation protocol: Yes   Plan:  - cont heparin at 900 units/hr - Monitor daily heparin levels, CBC, and s/sx of bleeding  Thank you for involving pharmacy in this patient's care.  Excell Seltzer, PharmD

## 2018-08-23 NOTE — Progress Notes (Signed)
Pharmacy Antibiotic Note  [name] is a 85 y.o. male admitted on 08/20/2018 with right lower extremity cellulitis with gangrenous toes secondary to recent PAD procedure completed two weeks ago.  Pharmacy has been consulted for clindamycin dosing. WBC 13.9. Afebrile. Scr 1.26, CrCl ~52 ml/min and appears to be close to baseline. Vitals stable.   Plan: - Start clindamycin IV 600mg  q8hr - Change cefepime to 2g q12hr - Monitor Scr, CBC, clinical status  Height: 5' 11.5" (181.6 cm) Weight: 253 lb (114.8 kg) IBW/kg (Calculated) : 76.45  Temp (24hrs), Avg:98.6 F (37 C), Min:98 F (36.7 C), Max:99.4 F (37.4 C)  Recent Labs  Lab 08/20/18 1257 08/20/18 1735 08/21/18 0742 08/22/18 0228 08/23/18 0222  WBC 13.3*  --  11.9* 12.5* 13.9*  CREATININE 1.41*  --  1.19 1.34* 1.27*  LATICACIDVEN 1.7 1.8  --   --   --     Estimated Creatinine Clearance: 52.2 mL/min (A) (by C-G formula based on SCr of 1.27 mg/dL (H)).    Allergies  Allergen Reactions  . Iodinated Diagnostic Agents     Other reaction(s): Fever  . Iodine-131 Other (See Comments)    Breakout, fever  . Iohexol      Code: RASH, Desc: PT STATES HOT ALL OVER HAD TO HAVE ICE PACKS ALL OVER HIM AND DIFF BREATHING, PT REFUSED IV DYE     Antimicrobials this admission: Clinda 8/9 >> Cefepime 8/8>> Vanc 8/6>> 8/8 Zosyn 8/6>> 8/8   Dose adjustments this admission: Cefepime 1g q8hr >> 2g q12hr  Microbiology results: 8/6 BCx: ngtd 8/6 Covid: negative  Thank you for allowing pharmacy to be a part of this patient's care.  [name], PharmD PGY1 Pharmacy Resident [phone]

## 2018-08-23 NOTE — Progress Notes (Signed)
PROGRESS NOTE    [name]  WUJ:[id number]  DOB: Nov 11, 1927  DOA: 08/20/2018 PCP: [name], MD  Brief Narrative:  Per admission H&P "85 y.o. male with history of peripheral vascular disease who has recently underwent right lower extremity intervention for peripheral vascular disease presents to the ER because of worsening pain and redness of the right lower extremity.  Patient has been having a gangrenous toe which has been progressing getting worse over the last 1 month.  It has been progressing involving the whole toe now and progressing to the other part of the right foot.  Patient also noticed increasing redness involving the foot which has progressed to distal right leg. In the ER on exam patient has poor pulses in the extremities.  X-ray of the right foot does not show anything acute.  On exam patient has gangrenous looking right great toe and also some darkish areas on the plantar aspect of the foot.  Erythema involving the foot and the right distal part of the leg.  ER physician had discussed with on-call cardiologist since patient had procedure done by cardiologist 2 weeks ago.  They will be seeing patient in consult.  Also discussed with Dr. Marlou Sa on-call orthopedic surgeon.  Patient admitted for further management of cellulitis and gangrene of the right foot.  On antibiotics.  Labs show worsening anemia for which stool for occult blood was positive.  Patient has not noticed any obvious bleeding.  Other labs include sodium 124 creatinine 1.4 was given some fluid in the ER."  Subjective: Patient appears comfortable. Bleeding from right antecubital area improved.  Saturating well on room air. SBP 104-120. Daughter bedside  Objective: Vitals:   08/22/18 2118 08/22/18 2315 08/23/18 0634 08/23/18 1159  BP: (!) 115/55 131/66 139/60 126/67  Pulse: 68 64 68 62  Resp: 16 19 19 17   Temp: 99.4 F (37.4 C) 98 F (36.7 C) 98 F (36.7 C) 97.8 F (36.6 C)  TempSrc: Oral   Oral  SpO2:  98% 98% 95% 97%  Weight:   114.8 kg   Height:        Intake/Output Summary (Last 24 hours) at 08/23/2018 1751 Last data filed at 08/23/2018 1300 Gross per 24 hour  Intake 360 ml  Output 700 ml  Net -340 ml   Filed Weights   08/21/18 0024 08/23/18 0634  Weight: 101.1 kg 114.8 kg    Physical Examination:  General exam: Appears calm and comfortable  Respiratory system: Clear to auscultation. Respiratory effort normal. Cardiovascular system: S1 & S2 heard, RRR. No JVD, murmurs, rubs, gallops or clicks. No pedal edema. Gastrointestinal system: Abdomen is nondistended, soft and nontender. No organomegaly or masses felt. Normal bowel sounds heard. Central nervous system: Alert and oriented. No focal neurological deficits. Extremities: Symmetric 5 x 5 power. Skin: Gangrenous changes to the right foot with surrounding cellulitis as shown below.  Also has right heel wound Psychiatry: Judgement and insight appear normal. Mood & affect appropriate.          Data Reviewed: I have personally reviewed following labs and imaging studies  CBC: Recent Labs  Lab 08/20/18 1257 08/21/18 0742 08/22/18 0228 08/23/18 0222  WBC 13.3* 11.9* 12.5* 13.9*  NEUTROABS 9.9* 8.8*  --   --   HGB 8.7* 7.7* 7.9* 8.0*  HCT 25.5* 22.7* 23.5* 23.7*  MCV 92.7 90.8 92.9 91.5  PLT 372 350 356 956   Basic Metabolic Panel: Recent Labs  Lab 08/20/18 1257 08/21/18 0742 08/22/18 0228 08/23/18  0222 08/23/18 0944  NA 124* 126*  --   --  127*  K 3.7 3.6  --   --  3.8  CL 88* 93*  --   --  94*  CO2 24 23  --   --  23  GLUCOSE 157* 147*  --   --  171*  BUN 21 19  --   --  16  CREATININE 1.41* 1.19 1.34* 1.27* 1.26*  CALCIUM 8.3* 7.9*  --   --  8.2*   GFR: Estimated Creatinine Clearance: 52.6 mL/min (A) (by C-G formula based on SCr of 1.26 mg/dL (H)). Liver Function Tests: Recent Labs  Lab 08/20/18 1257 08/21/18 0742  AST 117* 120*  ALT 139* 132*  ALKPHOS 82 76  BILITOT 0.7 0.8  PROT 6.2* 5.3*   ALBUMIN 2.3* 2.0*   No results for input(s): LIPASE, AMYLASE in the last 168 hours. No results for input(s): AMMONIA in the last 168 hours. Coagulation Profile: No results for input(s): INR, PROTIME in the last 168 hours. Cardiac Enzymes: No results for input(s): CKTOTAL, CKMB, CKMBINDEX, TROPONINI in the last 168 hours. BNP (last 3 results) No results for input(s): PROBNP in the last 8760 hours. HbA1C: No results for input(s): HGBA1C in the last 72 hours. CBG: Recent Labs  Lab 08/22/18 1737 08/22/18 2201 08/23/18 0747 08/23/18 1202 08/23/18 1735  GLUCAP 175* 172* 167* 163* 160*   Lipid Profile: No results for input(s): CHOL, HDL, LDLCALC, TRIG, CHOLHDL, LDLDIRECT in the last 72 hours. Thyroid Function Tests: No results for input(s): TSH, T4TOTAL, FREET4, T3FREE, THYROIDAB in the last 72 hours. Anemia Panel: No results for input(s): VITAMINB12, FOLATE, FERRITIN, TIBC, IRON, RETICCTPCT in the last 72 hours. Sepsis Labs: Recent Labs  Lab 08/20/18 1257 08/20/18 1735  LATICACIDVEN 1.7 1.8    Recent Results (from the past 240 hour(s))  Culture, blood (routine x 2)     Status: None (Preliminary result)   Collection Time: 08/20/18  5:35 PM   Specimen: BLOOD  Result Value Ref Range Status   Specimen Description BLOOD RIGHT ANTECUBITAL  Final   Special Requests   Final    BOTTLES DRAWN AEROBIC AND ANAEROBIC Blood Culture adequate volume   Culture   Final    NO GROWTH 3 DAYS Performed at [facility], [place] [address], [place], [place] [address]    Report Status PENDING  Incomplete  Culture, blood (routine x 2)     Status: None (Preliminary result)   Collection Time: 08/20/18  6:01 PM   Specimen: BLOOD  Result Value Ref Range Status   Specimen Description BLOOD RIGHT ANTECUBITAL  Final   Special Requests   Final    BOTTLES DRAWN AEROBIC AND ANAEROBIC Blood Culture adequate volume   Culture   Final    NO GROWTH 3 DAYS Performed at [facility], [place]  [address] [place], [place] [address]    Report Status PENDING  Incomplete  SARS Coronavirus 2 [facility] order, Performed in [facility] hospital lab) Nasopharyngeal Urine, Clean Catch     Status: None   Collection Time: 08/20/18  8:17 PM   Specimen: Urine, Clean Catch; Nasopharyngeal  Result Value Ref Range Status   SARS Coronavirus 2 NEGATIVE NEGATIVE Final    Comment: (NOTE) If result is NEGATIVE SARS-CoV-2 target nucleic acids are NOT DETECTED. The SARS-CoV-2 RNA is generally detectable in upper and lower  respiratory specimens during the acute phase of infection. The lowest  concentration of SARS-CoV-2 viral copies  this assay can detect is 250  copies / mL. A negative result does not preclude SARS-CoV-2 infection  and should not be used as the sole basis for treatment or other  patient management decisions.  A negative result may occur with  improper specimen collection / handling, submission of specimen other  than nasopharyngeal swab, presence of viral mutation(s) within the  areas targeted by this assay, and inadequate number of viral copies  (<250 copies / mL). A negative result must be combined with clinical  observations, patient history, and epidemiological information. If result is POSITIVE SARS-CoV-2 target nucleic acids are DETECTED. The SARS-CoV-2 RNA is generally detectable in upper and lower  respiratory specimens dur ing the acute phase of infection.  Positive  results are indicative of active infection with SARS-CoV-2.  Clinical  correlation with patient history and other diagnostic information is  necessary to determine patient infection status.  Positive results do  not rule out bacterial infection or co-infection with other viruses. If result is PRESUMPTIVE POSTIVE SARS-CoV-2 nucleic acids MAY BE PRESENT.   A presumptive positive result was obtained on the submitted specimen  and confirmed on repeat testing.  While 2019 novel coronavirus  (SARS-CoV-2) nucleic acids  may be present in the submitted sample  additional confirmatory testing may be necessary for epidemiological  and / or clinical management purposes  to differentiate between  SARS-CoV-2 and other Sarbecovirus currently known to infect humans.  If clinically indicated additional testing with an alternate test  methodology [phone]) is advised. The SARS-CoV-2 RNA is generally  detectable in upper and lower respiratory sp ecimens during the acute  phase of infection. The expected result is Negative. Fact Sheet for Patients:  StrictlyIdeas.no Fact Sheet for Healthcare Providers: BankingDealers.co.za This test is not yet approved or cleared by the [place] FDA and has been authorized for detection and/or diagnosis of SARS-CoV-2 by FDA under an Emergency Use Authorization (EUA).  This EUA will remain in effect (meaning this test can be used) for the duration of the COVID-19 declaration under Section 564(b)(1) of the Act, 21 U.S.C. section 360bbb-3(b)(1), unless the authorization is terminated or revoked sooner. Performed at [facility], [place] [address] [place], [place] [address]       Radiology Studies: No results found.      Scheduled Meds: . aspirin EC  81 mg Oral Daily  . atorvastatin  40 mg Oral QPC supper  . dorzolamide-timolol  1 drop Both Eyes BID  . doxazosin  2 mg Oral QHS  . famotidine  20 mg Oral BID  . finasteride  5 mg Oral QPM  . insulin aspart  0-9 Units Subcutaneous TID WC  . levothyroxine  50 mcg Oral Q0600  . pyridOXINE  100 mg Oral Daily  . tamsulosin  0.4 mg Oral BID  . vitamin C  500 mg Oral Daily  . vitamin E  400 Units Oral Daily   Continuous Infusions: . ceFEPime (MAXIPIME) IV    . clindamycin (CLEOCIN) IV 600 mg (08/23/18 1449)  . heparin 800 Units/hr (08/23/18 1233)    Assessment & Plan:   1. Right lower extremity cellulitis with gangrenous toes:  -cardiologist and orthopedic surgeon  has been consulted patient . Patient admitted with empiric antibiotics-vancomycin/zosyn. Changed to cefepime/clindamycin now.  In anticipation of procedure holding Eliquis and  patient started on heparin.  Patient been on dual antiplatelet agents. Plavix held yesterday in view of #4. Patient planned for endovascular evaluation and possible intervention on Tuesday.  If circulation improves, may be candidate for transtibial amputation.  If not may end up needing AKA per Dr Sharol Given  2. Peripheral vascular disease. S/p PTCA in 05/2010. Underwent diagnostic angiogram by Dr Gwenlyn Found on 7/13 and findings as below. On asa/anticoagulation/statins.  Patient planned for endovascular evaluation and possible intervention on Tuesday (8/11).   Dr Gwenlyn Found -7/17 note as below:     " I did place a left SFA stent in him in May 2012 which has remained patent although he does have significant disease beyond the SFA stent and 0 vessel runoff bilaterally.  He had a 99% calcified right above-the-knee popliteal artery stenosis which I did not intervene on because I used 135 cc of contrast and was worried about renal insufficiency.  He was discharged home the same day.  He does complain of some pain in his right foot and has a right great toe wound which occurred after his toenail was removed.  His ABI on that side fell from 0.82 down 0.43 on 07/21/2018.  My plan is to perform diamondback orbital rotational atherectomy, PTA possible stenting of his distal right SFA/popliteal artery to improve collaterals.  He may need tibial pedal access subsequent to that establish inline flow to promote healing"  3.  Proximal atrial fibrillation on no rate limiting medications since history of bradycardia.  On heparin at this time in anticipation of procedure and Eliquis on hold  4.  Bleeding from IV sites: Patient has had episodes of difficult to control bleeding from rt antecubital/ wrist IV sites requiring pressure dressing. Held heparin for 4 hours  yesterday, d/ced plavix. Now improved and remains on asa/resumed on heparin. Since no new stents and plan for surgical interventions/bleeding risk, holding plavix , until advised otherwise by cardiology  5. Acute on Chronic anemia with stool for occult blood positive.  Closely follow CBC as patient on dual antiplatelet therapy and Eliquis.  6. CKD stage 2-3 : Baseline creatinine appears to be around 1.4. Monitor closely. Abx regimen changed as above.   7. Hypertension: On Imdur/Doxazosin.Hold Imdur given low BP  8.  Hypothyroidism Synthroid.  9. History of BPH : tamsulosin/finasteride and doxazosin.   10. History of moderate aortic valve stenosis -monitor for now  11. Diabetes mellitus type 2:  on sliding scale coverage. BG [phone]   DVT prophylaxis: Anticoagulation as above Code Status: Full code Family / Patient Communication: Discussed with patient and d/w daughter bedside. She had questions regarding surgical plan, encouraged to discuss further with Dr Sharol Given. She is also concerned about his quality of life as he will turn 39 tomorrow. Palliative care consulted for support and care goal discussions Disposition Plan: TBD. Will likely need SNF rehab     LOS: 2 days    Time spent: 35 minutes    Guilford Shi, MD [facility] Pager [phone]  If 7PM-7AM, please contact night-coverage [email or web address] Password [email or web address] 08/23/2018, 5:51 PM

## 2018-08-23 NOTE — Progress Notes (Signed)
ANTICOAGULATION CONSULT NOTE - Follow Up  Pharmacy Consult for Heparin Indication: atrial fibrillation  Allergies  Allergen Reactions  . Iodinated Diagnostic Agents     Other reaction(s): Fever  . Iodine-131 Other (See Comments)    Breakout, fever  . Iohexol      Code: RASH, Desc: PT STATES HOT ALL OVER HAD TO HAVE ICE PACKS ALL OVER HIM AND DIFF BREATHING, PT REFUSED IV DYE     Patient Measurements: Height: 5' 11.5" (181.6 cm) Weight: 253 lb (114.8 kg) IBW/kg (Calculated) : 76.45 Heparin Dosing Weight: 97.2 kg  Vital Signs: Temp: 97.8 F (36.6 C) (08/09 1159) Temp Source: Oral (08/09 1159) BP: 126/67 (08/09 1159) Pulse Rate: 62 (08/09 1159)  Labs: Recent Labs    08/21/18 0742  08/22/18 0228 08/22/18 1210 08/23/18 0222 08/23/18 0944  HGB 7.7*  --  7.9*  --  8.0*  --   HCT 22.7*  --  23.5*  --  23.7*  --   PLT 350  --  356  --  397  --   APTT 106*   < > 105* 80* 66* 100*  HEPARINUNFRC 2.06*  --  1.12*  --   --  0.74*  CREATININE 1.19  --  1.34*  --  1.27* 1.26*   < > = values in this interval not displayed.    Estimated Creatinine Clearance: 52.6 mL/min (A) (by C-G formula based on SCr of 1.26 mg/dL (H)).  Assessment: 16 yoM admitted for lower right extremity cellulitis with gangrenous toes. On apixaban 2.5 mg BID for Afib PTA. Last dose taken 8/6 @0930 . Pharmacy consulted 08/20/18 to dose heparin. 8/8 pt had an bleeding event from an old IV site, but bleeding has resolved per RN.   HL 0.41 with an PTT of 68 seconds-- both therapuetic. HL and aPTT are correlating. Hgb 8.0 (stable), Plt WNL. No bleeding or infusion issues per the RN.   Goal of Therapy:  APTT = 66-102 sec Heparin level 0.3-0.7 units/ml Monitor platelets by anticoagulation protocol: Yes   Plan:  Continue Heparin gtt 800 units/hr - Monitor daily HL, CBC, and s/sx of bleeding  Thank you for involving pharmacy in this patient's care.  Acey Lav, PharmD  PGY1 Acute Care Pharmacy  Resident [phone]

## 2018-08-23 NOTE — Progress Notes (Signed)
ANTICOAGULATION CONSULT NOTE - Follow Up  Pharmacy Consult for heparin Indication: atrial fibrillation  Allergies  Allergen Reactions  . Iodinated Diagnostic Agents     Other reaction(s): Fever  . Iodine-131 Other (See Comments)    Breakout, fever  . Iohexol      Code: RASH, Desc: PT STATES HOT ALL OVER HAD TO HAVE ICE PACKS ALL OVER HIM AND DIFF BREATHING, PT REFUSED IV DYE     Patient Measurements: Height: 5' 11.5" (181.6 cm) Weight: 253 lb (114.8 kg) IBW/kg (Calculated) : 76.45 Heparin Dosing Weight: 96.6 kg  Vital Signs: Temp: 98 F (36.7 C) (08/09 0634) BP: 139/60 (08/09 0634) Pulse Rate: 68 (08/09 0634)  Labs: Recent Labs    08/21/18 0742  08/22/18 0228 08/22/18 1210 08/23/18 0222 08/23/18 0944  HGB 7.7*  --  7.9*  --  8.0*  --   HCT 22.7*  --  23.5*  --  23.7*  --   PLT 350  --  356  --  397  --   APTT 106*   < > 105* 80* 66* 100*  HEPARINUNFRC 2.06*  --  1.12*  --   --  0.74*  CREATININE 1.19  --  1.34*  --  1.27* 1.26*   < > = values in this interval not displayed.    Estimated Creatinine Clearance: 52.6 mL/min (A) (by C-G formula based on SCr of 1.26 mg/dL (H)).   Medical History: Past Medical History:  Diagnosis Date  . Arthritis   . Borderline diabetic   . Glaucoma   . Hyperlipidemia   . Hypertension   . PVD (peripheral vascular disease) ([place])   . Sleep apnea    Cpap ordered but doesnt use    Medications:  Scheduled:  . aspirin EC  81 mg Oral Daily  . atorvastatin  40 mg Oral QPC supper  . dorzolamide-timolol  1 drop Both Eyes BID  . doxazosin  2 mg Oral QHS  . famotidine  20 mg Oral BID  . finasteride  5 mg Oral QPM  . insulin aspart  0-9 Units Subcutaneous TID WC  . levothyroxine  50 mcg Oral Q0600  . pyridOXINE  100 mg Oral Daily  . tamsulosin  0.4 mg Oral BID  . vitamin C  500 mg Oral Daily  . vitamin E  400 Units Oral Daily   Infusions:  . ceFEPime (MAXIPIME) IV    . clindamycin (CLEOCIN) IV 600 mg (08/23/18 1107)  .  heparin 900 Units/hr (08/23/18 0411)    Assessment: 76 yoM admitted for lower right extremity cellulitis with gangrenous toes. On apixaban 2.5 mg BID for Afib PTA. Last dose taken 8/6 @0930 . Pharmacy consulted 08/20/18 to dose heparin.  This morning, his HL was 0.74 with an aPTT of 100 seconds--aPTT at very upper end of goal, HL barely supratherapeutic. HL and aPTT are now correlating. Patient required holding heparin d/t bleeding at old IV site yesterday despite therapeutic aPTT of 80. Hgb now stable, Plt are WNL. No bleeding or infusion issues per the RN.   Goal of Therapy:  APTT = 66-102 sec Heparin level 0.3-0.7 units/ml Monitor platelets by anticoagulation protocol: Yes   Plan:  - D/t recent bleeding and being at upper end of goal, will decrease heparin rate to 800 units/hr - Recheck HL in 8 hrs at 1900 - Monitor daily HL, CBC, and s/sx of bleeding  Thank you for involving pharmacy in this patient's care.  [name], PharmD PGY1 Pharmacy Resident [phone]

## 2018-08-24 ENCOUNTER — Encounter (HOSPITAL_COMMUNITY): Payer: Self-pay | Admitting: Primary Care

## 2018-08-24 DIAGNOSIS — Z515 Encounter for palliative care: Secondary | ICD-10-CM

## 2018-08-24 DIAGNOSIS — Z7189 Other specified counseling: Secondary | ICD-10-CM

## 2018-08-24 LAB — BASIC METABOLIC PANEL
Anion gap: 10 (ref 5–15)
BUN: 15 mg/dL (ref 8–23)
CO2: 21 mmol/L — ABNORMAL LOW (ref 22–32)
Calcium: 8.1 mg/dL — ABNORMAL LOW (ref 8.9–10.3)
Chloride: 92 mmol/L — ABNORMAL LOW (ref 98–111)
Creatinine, Ser: 1.2 mg/dL (ref 0.61–1.24)
GFR calc Af Amer: >60 mL/min (ref >60)
GFR calc non Af Amer: 53 mL/min — ABNORMAL LOW (ref >60)
Glucose, Bld: 168 mg/dL — ABNORMAL HIGH (ref 70–99)
Potassium: 4.2 mmol/L (ref 3.5–5.1)
Sodium: 123 mmol/L — ABNORMAL LOW (ref 135–145)

## 2018-08-24 LAB — CBC WITH DIFFERENTIAL/PLATELET
Abs Immature Granulocytes: 0.11 10*3/uL — ABNORMAL HIGH (ref 0.00–0.07)
Basophils Absolute: 0.1 10*3/uL (ref 0.0–0.1)
Basophils Relative: 0 %
Eosinophils Absolute: 0.3 10*3/uL (ref 0.0–0.5)
Eosinophils Relative: 2 %
HCT: 21.6 % — ABNORMAL LOW (ref 39.0–52.0)
Hemoglobin: 7.3 g/dL — ABNORMAL LOW (ref 13.0–17.0)
Immature Granulocytes: 1 %
Lymphocytes Relative: 16 %
Lymphs Abs: 2.3 10*3/uL (ref 0.7–4.0)
MCH: 31.2 pg (ref 26.0–34.0)
MCHC: 33.8 g/dL (ref 30.0–36.0)
MCV: 92.3 fL (ref 80.0–100.0)
Monocytes Absolute: 1 10*3/uL (ref 0.1–1.0)
Monocytes Relative: 7 %
Neutro Abs: 10.3 10*3/uL — ABNORMAL HIGH (ref 1.7–7.7)
Neutrophils Relative %: 74 %
Platelets: 384 10*3/uL (ref 150–400)
RBC: 2.34 MIL/uL — ABNORMAL LOW (ref 4.22–5.81)
RDW: 13.9 % (ref 11.5–15.5)
WBC: 13.9 10*3/uL — ABNORMAL HIGH (ref 4.0–10.5)
nRBC: 0 % (ref 0.0–0.2)

## 2018-08-24 LAB — GLUCOSE, CAPILLARY
Glucose-Capillary: 167 mg/dL — ABNORMAL HIGH (ref 70–99)
Glucose-Capillary: 146 mg/dL — ABNORMAL HIGH (ref 70–99)
Glucose-Capillary: 186 mg/dL — ABNORMAL HIGH (ref 70–99)
Glucose-Capillary: 224 mg/dL — ABNORMAL HIGH (ref 70–99)

## 2018-08-24 LAB — HEPARIN LEVEL (UNFRACTIONATED): Heparin Unfractionated: 0.35 IU/mL (ref 0.30–0.70)

## 2018-08-24 LAB — PREPARE RBC (CROSSMATCH)

## 2018-08-24 LAB — TROPONIN I (HIGH SENSITIVITY): Troponin I (High Sensitivity): 26 ng/L — ABNORMAL HIGH (ref <18)

## 2018-08-24 MED ORDER — CLOPIDOGREL BISULFATE 75 MG PO TABS
75.0000 mg | ORAL_TABLET | Freq: Every day | ORAL | Status: DC
  Administered 2018-08-24 – 2018-09-07 (×15): 75 mg via ORAL
  Filled 2018-08-24 (×15): qty 1

## 2018-08-24 MED ORDER — OXYCODONE-ACETAMINOPHEN 5-325 MG PO TABS
1.0000 | ORAL_TABLET | Freq: Three times a day (TID) | ORAL | Status: DC | PRN
  Administered 2018-08-28 – 2018-08-29 (×3): 1 via ORAL
  Filled 2018-08-24 (×3): qty 1

## 2018-08-24 MED ORDER — MORPHINE SULFATE (PF) 2 MG/ML IV SOLN
2.0000 mg | Freq: Once | INTRAVENOUS | Status: AC
  Administered 2018-08-24: 2 mg via INTRAVENOUS
  Filled 2018-08-24: qty 1

## 2018-08-24 MED ORDER — SODIUM CHLORIDE 0.9% IV SOLUTION
Freq: Once | INTRAVENOUS | Status: AC
  Administered 2018-08-24: 18:00:00 via INTRAVENOUS

## 2018-08-24 MED ORDER — FUROSEMIDE 10 MG/ML IJ SOLN
40.0000 mg | Freq: Once | INTRAMUSCULAR | Status: AC
  Administered 2018-08-24: 40 mg via INTRAVENOUS
  Filled 2018-08-24: qty 4

## 2018-08-24 MED ORDER — SODIUM CHLORIDE 0.9 % IV SOLN
INTRAVENOUS | Status: AC
  Administered 2018-08-24: 19:00:00 via INTRAVENOUS

## 2018-08-24 NOTE — H&P (View-Only) (Signed)
Pt is for PV angiogram in AM at 0730. With Dr. Fletcher Anon   His Hgb is low at 7.3, per Dr. Gwenlyn Found would recommend 2 Units PRBCs  With lasix between units in preparation for [facility] angio tomorrow morning.  Will need to resume plavix as well.

## 2018-08-24 NOTE — Progress Notes (Signed)
PROGRESS NOTE    [name]  GNF:[id number] DOB: [birth year] DOA: 08/20/2018 PCP: [name], MD    Brief Narrative:  [name] is a 85 y.o. M who presented to [facility] on 8/6 with reports of RLE swelling and discoloration.    He has had ongoing issues with dry gangrene of the right great toe and followed by wound care. The foot has been progressively worsening in the last month prior to admit with increased swelling, erythema extending up the leg and right great toe pain.  He had a toenail removed on that side which he attributed pain to at the time.  He is followed by Dr. Gwenlyn Found for PVD with critical RLE ischemia with recent staged peripheral angiography on 7/13 with findings of 99% calcified right above the knee popliteal artery stenosis.  He underwent subsequent atherectomy with stenting of SFA (08/03/18).   Initial ER evaluation included a plain film that was negative.  Admit labs notable for anemia with FOBT positive but no evidence of bleeding, hyponatremia, sr cr 1.4 (baseline ~ 1.1-1.6).  He was given fluids on admit. Empirically treated for cellulitis x5 days.     Assessment & Plan:  Right Lower Extremity Gangrene with Cellulitis  -in setting of critical vascular disease, DM  Peripheral Vascular Disease  P: Ortho / Cardiology following  Empiric antibiotics D5/5 > discussed with ID pharmacy, will stop as has been treated for cellulitis Pending endovascular evaluation 8/11 per Cardiology. If circulation improves with above, may be a candidate for transtibial amputation vs AKA if ongoing poor circulation Plavix on hold since 8/8  Paroxysmal Atrial Fibrillation  -hx of bradycardia, not on rate controlling agents  -eliquis PTA, ASA (x1 month post stenting), plavix P: Hold plavix for now pending decision regarding surgical intervention  Tele monitoring  Heparin gtt per pharmacy   Acute on Chronic Anemia  -FOBT positive on admit  -bleeding from IV sites  P: Trend CBC  Transfuse  per guidelines, Hgb <7%, active bleeding or MI <8% Monitor for bleeding  Heparin as above Consider GI follow up as outpatient or if further evidence of bleeding  CKD III -baseline sr cr 1.1 -1.6 Hyponatremia  P: Follow BMP / UOP  Monitor & replace electrolytes as indicated  Avoid nephrotoxic agents as able  Add gentle IVF with pending repeat angiography, NS at 40 ml/hr  HTN P: Hold home imdur   HLD P: Continue lipitor   Moderate AS P: No acute interventions, monitor  DM II  P: SSI, sensitive scale   Hypothyroidism  -TSH 3 07/23/18 P: Continue synthroid Follow up TSH as outpatient   BPH P: Continue flomax, finasteride, cardura  OSA -has CPAP but does not use  P: Consider QHS autoset CPAP while inpatient       . aspirin EC  81 mg Oral Daily  . atorvastatin  40 mg Oral QPC supper  . dorzolamide-timolol  1 drop Both Eyes BID  . doxazosin  2 mg Oral QHS  . famotidine  20 mg Oral BID  . finasteride  5 mg Oral QPM  . insulin aspart  0-9 Units Subcutaneous TID WC  . levothyroxine  50 mcg Oral Q0600  . pyridOXINE  100 mg Oral Daily  . tamsulosin  0.4 mg Oral BID  . vitamin C  500 mg Oral Daily  . vitamin E  400 Units Oral Daily   @[email or web address] @  Current Facility-Administered Medications for the 08/20/18 encounter [facility] Encounter)  Medication  . 0.9 %  sodium chloride infusion   Current Meds  Medication Sig  . apixaban (ELIQUIS) 2.5 MG TABS tablet Take 1 tablet (2.5 mg total) by mouth 2 (two) times daily.  [name] aspirin EC 81 MG EC tablet Take 1 tablet (81 mg total) by mouth daily. Stop taking aspirin 09/04/2018.  [name] atorvastatin (LIPITOR) 40 MG tablet TAKE 1 TABLET BY MOUTH ONCE DAILY (Patient taking differently: Take 40 mg by mouth daily after supper. )  . calcium carbonate (TUMS - DOSED IN MG ELEMENTAL CALCIUM) 500 MG chewable tablet Chew 1 tablet by mouth as needed for indigestion or heartburn.  . clopidogrel (PLAVIX) 75 MG tablet Take 1 tablet (75 mg total) by  mouth daily with breakfast.  . docusate sodium (COLACE) 100 MG capsule Take 100 mg by mouth 2 (two) times daily as needed (for use with oxycodone to prevent constipation.).  [name] dorzolamide-timolol (COSOPT) 22.3-6.8 MG/ML ophthalmic solution Place 1 drop into both eyes 2 (two) times daily.  [name] doxazosin (CARDURA) 2 MG tablet Take 1 tablet (2 mg total) by mouth at bedtime.  . finasteride (PROSCAR) 5 MG tablet Take 5 mg by mouth every evening.   . furosemide (LASIX) 40 MG tablet TAKE 1 TABLET(40 MG) BY MOUTH DAILY (Patient taking differently: Take 40 mg by mouth daily. TAKE 1 TABLET(40 MG) BY MOUTH DAILY)  . glipiZIDE (GLUCOTROL) 5 MG tablet Take 5 mg by mouth 2 (two) times daily.   . isosorbide mononitrate (IMDUR) 30 MG 24 hr tablet Take 0.5 tablets (15 mg total) by mouth daily.  [name] levothyroxine (SYNTHROID, LEVOTHROID) 50 MCG tablet Take 50 mcg by mouth daily before breakfast.   . metoprolol tartrate (LOPRESSOR) 25 MG tablet Take 12.5 mg as needed for palpitations/heart racing (Patient taking differently: Take 12.5 mg by mouth daily as needed (palpitations/heart racing). Take 12.5 mg as needed for palpitations/heart racin)  . Omega-3 Fatty Acids (FISH OIL PO) Take 1 capsule by mouth daily.  [name] oxyCODONE-acetaminophen (PERCOCET) 5-325 MG tablet Take 1 tablet by mouth every 8 (eight) hours as needed for severe pain.  [name] pyridOXINE (VITAMIN B-6) 100 MG tablet Take 100 mg by mouth daily.  . tamsulosin (FLOMAX) 0.4 MG CAPS Take 0.4 mg by mouth 2 (two) times daily.   . vitamin C (ASCORBIC ACID) 500 MG tablet Take 500 mg by mouth daily.  . vitamin E 400 UNIT capsule Take 400 Units by mouth daily.    DVT prophylaxis: heparin  Code Status: Full Code  Family Communication: SonEdd Arbour [phone]) updated on patients status 8/10 am at bedside.  He understands plan of care as outlined.  Await findings of angiography on 8/10, pending results may require amputation.  He is tearful when discussing his father as he  relays how active he has been in the past.    Consultants:   Cardiology - Dr. Robert Bellow - Dr. Sharol Given   Palliative Care  Procedures:   8/11 Angiography >>   Antimicrobials:   Vanco 8/6 >> 8/9  Zosyn 8/6 >>8/8  Cefepime 8/8 >> 8/10  Clindamycin 8/9 >>    Subjective: Pt reports he did not sleep well last night.  Reports RLE pain.  RN reports no acute events overnight.  Afebrile.  VSS.    Objective: Vitals:   08/23/18 0634 08/23/18 1159 08/23/18 2247 08/24/18 0618  BP: 139/60 126/67 (!) 144/70 (!) 147/71  Pulse: 68 62 67 76  Resp: 19 17 19 19   Temp: 98 F (36.7 C) 97.8 F (36.6 C) 98  F (36.7 C) 98.6 F (37 C)  TempSrc:  Oral    SpO2: 95% 97% 95% 93%  Weight: 114.8 kg     Height:        Intake/Output Summary (Last 24 hours) at 08/24/2018 0935 Last data filed at 08/24/2018 0500 Gross per 24 hour  Intake 618.11 ml  Output 1645 ml  Net -1026.89 ml   Filed Weights   08/21/18 0024 08/23/18 0634  Weight: 101.1 kg 114.8 kg    Examination: General appearance: elderly adult male, alert and in no acute distress.   HEENT: Anicteric, conjunctiva pink, lids and lashes normal. No nasal deformity, discharge, epistaxis.  Lips dry.   Skin: Warm and dry.  No jaundice.  RLE with erythema around ankle. No difference in warmth bilaterally.  Cardiac: RRR, nl T7-S1, 3/6 holosystolic murmur. No JVD. Capillary refill is brisk <3 sec. 1+ bilateral LE edema half way up posterior tibial.  Radial pulses 2+ and symmetric. Respiratory: Normal respiratory rate and rhythm.  CTAB without rales.  Upper airway wheeze with deferred wheezing noted.  Abdomen: Abdomen soft.  No ascites, distension, hepatosplenomegaly.   MSK: No deformities or effusions. Neuro: Awakens to voice, alert, oriented. Drowsy but responds appropriately.  EOMI, moves all extremities. Speech fluent.    Psych: Sensorium intact and responding to questions, attention normal. Affect normal.    Data Reviewed: I have  personally reviewed following labs and imaging studies:  CBC: Recent Labs  Lab 08/20/18 1257 08/21/18 0742 08/22/18 0228 08/23/18 0222 08/24/18 0214  WBC 13.3* 11.9* 12.5* 13.9* 13.9*  NEUTROABS 9.9* 8.8*  --   --  10.3*  HGB 8.7* 7.7* 7.9* 8.0* 7.3*  HCT 25.5* 22.7* 23.5* 23.7* 21.6*  MCV 92.7 90.8 92.9 91.5 92.3  PLT 372 350 356 397 779   Basic Metabolic Panel: Recent Labs  Lab 08/20/18 1257 08/21/18 0742 08/22/18 0228 08/23/18 0222 08/23/18 0944 08/24/18 0214  NA 124* 126*  --   --  127* 123*  K 3.7 3.6  --   --  3.8 4.2  CL 88* 93*  --   --  94* 92*  CO2 24 23  --   --  23 21*  GLUCOSE 157* 147*  --   --  171* 168*  BUN 21 19  --   --  16 15  CREATININE 1.41* 1.19 1.34* 1.27* 1.26* 1.20  CALCIUM 8.3* 7.9*  --   --  8.2* 8.1*   GFR: Estimated Creatinine Clearance: 54.2 mL/min (by C-G formula based on SCr of 1.2 mg/dL). Liver Function Tests: Recent Labs  Lab 08/20/18 1257 08/21/18 0742  AST 117* 120*  ALT 139* 132*  ALKPHOS 82 76  BILITOT 0.7 0.8  PROT 6.2* 5.3*  ALBUMIN 2.3* 2.0*   No results for input(s): LIPASE, AMYLASE in the last 168 hours. No results for input(s): AMMONIA in the last 168 hours. Coagulation Profile: No results for input(s): INR, PROTIME in the last 168 hours. Cardiac Enzymes: No results for input(s): CKTOTAL, CKMB, CKMBINDEX, TROPONINI in the last 168 hours. BNP (last 3 results) No results for input(s): PROBNP in the last 8760 hours. HbA1C: No results for input(s): HGBA1C in the last 72 hours. CBG: Recent Labs  Lab 08/23/18 0747 08/23/18 1202 08/23/18 1735 08/23/18 2247 08/24/18 0809  GLUCAP 167* 163* 160* 168* 167*   Lipid Profile: No results for input(s): CHOL, HDL, LDLCALC, TRIG, CHOLHDL, LDLDIRECT in the last 72 hours. Thyroid Function Tests: No results for input(s): TSH, T4TOTAL, FREET4, T3FREE,  THYROIDAB in the last 72 hours. Anemia Panel: No results for input(s): VITAMINB12, FOLATE, FERRITIN, TIBC, IRON,  RETICCTPCT in the last 72 hours. Urine analysis:    Component Value Date/Time   COLORURINE YELLOW 08/20/2018 2015   APPEARANCEUR CLEAR 08/20/2018 2015   LABSPEC 1.010 08/20/2018 2015   PHURINE 5.0 08/20/2018 2015   GLUCOSEU NEGATIVE 08/20/2018 2015   HGBUR NEGATIVE 08/20/2018 2015   [place] NEGATIVE 08/20/2018 2015   [place] NEGATIVE 08/20/2018 2015   PROTEINUR NEGATIVE 08/20/2018 2015   NITRITE NEGATIVE 08/20/2018 2015   LEUKOCYTESUR NEGATIVE 08/20/2018 2015   Sepsis Labs: @LABRCNTIP (procalcitonin:4,lacticacidven:4)  ) Recent Results (from the past 240 hour(s))  Culture, blood (routine x 2)     Status: None (Preliminary result)   Collection Time: 08/20/18  5:35 PM   Specimen: BLOOD  Result Value Ref Range Status   Specimen Description BLOOD RIGHT ANTECUBITAL  Final   Special Requests   Final    BOTTLES DRAWN AEROBIC AND ANAEROBIC Blood Culture adequate volume   Culture   Final    NO GROWTH 3 DAYS Performed at [facility], [place] [address], [place], [place] [address]    Report Status PENDING  Incomplete  Culture, blood (routine x 2)     Status: None (Preliminary result)   Collection Time: 08/20/18  6:01 PM   Specimen: BLOOD  Result Value Ref Range Status   Specimen Description BLOOD RIGHT ANTECUBITAL  Final   Special Requests   Final    BOTTLES DRAWN AEROBIC AND ANAEROBIC Blood Culture adequate volume   Culture   Final    NO GROWTH 3 DAYS Performed at [facility], [place] [address], [place], [place] [address]    Report Status PENDING  Incomplete  SARS Coronavirus 2 [facility] order, Performed in [facility] hospital lab) Nasopharyngeal Urine, Clean Catch     Status: None   Collection Time: 08/20/18  8:17 PM   Specimen: Urine, Clean Catch; Nasopharyngeal  Result Value Ref Range Status   SARS Coronavirus 2 NEGATIVE NEGATIVE Final    Comment: (NOTE) If result is NEGATIVE SARS-CoV-2 target nucleic acids are NOT DETECTED. The SARS-CoV-2 RNA is generally  detectable in upper and lower  respiratory specimens during the acute phase of infection. The lowest  concentration of SARS-CoV-2 viral copies this assay can detect is 250  copies / mL. A negative result does not preclude SARS-CoV-2 infection  and should not be used as the sole basis for treatment or other  patient management decisions.  A negative result may occur with  improper specimen collection / handling, submission of specimen other  than nasopharyngeal swab, presence of viral mutation(s) within the  areas targeted by this assay, and inadequate number of viral copies  (<250 copies / mL). A negative result must be combined with clinical  observations, patient history, and epidemiological information. If result is POSITIVE SARS-CoV-2 target nucleic acids are DETECTED. The SARS-CoV-2 RNA is generally detectable in upper and lower  respiratory specimens dur ing the acute phase of infection.  Positive  results are indicative of active infection with SARS-CoV-2.  Clinical  correlation with patient history and other diagnostic information is  necessary to determine patient infection status.  Positive results do  not rule out bacterial infection or co-infection with other viruses. If result is PRESUMPTIVE POSTIVE SARS-CoV-2 nucleic acids MAY BE PRESENT.   A presumptive positive result was obtained on the submitted specimen  and confirmed on repeat testing.  While 2019 novel coronavirus  (  SARS-CoV-2) nucleic acids may be present in the submitted sample  additional confirmatory testing may be necessary for epidemiological  and / or clinical management purposes  to differentiate between  SARS-CoV-2 and other Sarbecovirus currently known to infect humans.  If clinically indicated additional testing with an alternate test  methodology [phone]) is advised. The SARS-CoV-2 RNA is generally  detectable in upper and lower respiratory sp ecimens during the acute  phase of infection. The  expected result is Negative. Fact Sheet for Patients:  StrictlyIdeas.no Fact Sheet for Healthcare Providers: BankingDealers.co.za This test is not yet approved or cleared by the [place] FDA and has been authorized for detection and/or diagnosis of SARS-CoV-2 by FDA under an Emergency Use Authorization (EUA).  This EUA will remain in effect (meaning this test can be used) for the duration of the COVID-19 declaration under Section 564(b)(1) of the Act, 21 U.S.C. section 360bbb-3(b)(1), unless the authorization is terminated or revoked sooner. Performed at [facility], [place] [address], [place], [place] [address]      Radiology Studies: No results found.   Scheduled Meds: . aspirin EC  81 mg Oral Daily  . atorvastatin  40 mg Oral QPC supper  . dorzolamide-timolol  1 drop Both Eyes BID  . doxazosin  2 mg Oral QHS  . famotidine  20 mg Oral BID  . finasteride  5 mg Oral QPM  . insulin aspart  0-9 Units Subcutaneous TID WC  . levothyroxine  50 mcg Oral Q0600  . pyridOXINE  100 mg Oral Daily  . tamsulosin  0.4 mg Oral BID  . vitamin C  500 mg Oral Daily  . vitamin E  400 Units Oral Daily   Continuous Infusions: . ceFEPime (MAXIPIME) IV 2 g (08/24/18 0750)  . clindamycin (CLEOCIN) IV 600 mg (08/24/18 0739)  . heparin 800 Units/hr (08/23/18 1233)     LOS: 3 days    Time spent: 40 minutes   Noe Gens, [place]    Estimated body mass index is 34.79 kg/m as calculated from the following:   Height as of this encounter: 5' 11.5" (1.816 m).   Weight as of this encounter: 114.8 kg. Malnutrition Type:   Malnutrition Characteristics:   Nutrition Interventions:      . aspirin EC  81 mg Oral Daily  . atorvastatin  40 mg Oral QPC supper  . dorzolamide-timolol  1 drop Both Eyes BID  . doxazosin  2 mg Oral QHS  . famotidine  20 mg Oral BID  . finasteride  5 mg Oral QPM  . insulin aspart   0-9 Units Subcutaneous TID WC  . levothyroxine  50 mcg Oral Q0600  . pyridOXINE  100 mg Oral Daily  . tamsulosin  0.4 mg Oral BID  . vitamin C  500 mg Oral Daily  . vitamin E  400 Units Oral Daily   . ceFEPime (MAXIPIME) IV 2 g (08/24/18 0750)  . clindamycin (CLEOCIN) IV 600 mg (08/24/18 0739)  . heparin 800 Units/hr (08/23/18 1233)    Current Facility-Administered Medications for the 08/20/18 encounter [facility] Encounter)  Medication  . 0.9 %  sodium chloride infusion   Current Meds  Medication Sig  . apixaban (ELIQUIS) 2.5 MG TABS tablet Take 1 tablet (2.5 mg total) by mouth 2 (two) times daily.  [name] aspirin EC 81 MG EC tablet Take 1 tablet (81 mg total) by mouth daily. Stop taking aspirin 09/04/2018.  [name] atorvastatin (LIPITOR) 40 MG tablet TAKE 1  TABLET BY MOUTH ONCE DAILY (Patient taking differently: Take 40 mg by mouth daily after supper. )  . calcium carbonate (TUMS - DOSED IN MG ELEMENTAL CALCIUM) 500 MG chewable tablet Chew 1 tablet by mouth as needed for indigestion or heartburn.  . clopidogrel (PLAVIX) 75 MG tablet Take 1 tablet (75 mg total) by mouth daily with breakfast.  . docusate sodium (COLACE) 100 MG capsule Take 100 mg by mouth 2 (two) times daily as needed (for use with oxycodone to prevent constipation.).  [name] dorzolamide-timolol (COSOPT) 22.3-6.8 MG/ML ophthalmic solution Place 1 drop into both eyes 2 (two) times daily.  [name] doxazosin (CARDURA) 2 MG tablet Take 1 tablet (2 mg total) by mouth at bedtime.  . finasteride (PROSCAR) 5 MG tablet Take 5 mg by mouth every evening.   . furosemide (LASIX) 40 MG tablet TAKE 1 TABLET(40 MG) BY MOUTH DAILY (Patient taking differently: Take 40 mg by mouth daily. TAKE 1 TABLET(40 MG) BY MOUTH DAILY)  . glipiZIDE (GLUCOTROL) 5 MG tablet Take 5 mg by mouth 2 (two) times daily.   . isosorbide mononitrate (IMDUR) 30 MG 24 hr tablet Take 0.5 tablets (15 mg total) by mouth daily.  [name] levothyroxine (SYNTHROID, LEVOTHROID) 50 MCG tablet Take 50 mcg  by mouth daily before breakfast.   . metoprolol tartrate (LOPRESSOR) 25 MG tablet Take 12.5 mg as needed for palpitations/heart racing (Patient taking differently: Take 12.5 mg by mouth daily as needed (palpitations/heart racing). Take 12.5 mg as needed for palpitations/heart racin)  . Omega-3 Fatty Acids (FISH OIL PO) Take 1 capsule by mouth daily.  [name] oxyCODONE-acetaminophen (PERCOCET) 5-325 MG tablet Take 1 tablet by mouth every 8 (eight) hours as needed for severe pain.  [name] pyridOXINE (VITAMIN B-6) 100 MG tablet Take 100 mg by mouth daily.  . tamsulosin (FLOMAX) 0.4 MG CAPS Take 0.4 mg by mouth 2 (two) times daily.   . vitamin C (ASCORBIC ACID) 500 MG tablet Take 500 mg by mouth daily.  . vitamin E 400 UNIT capsule Take 400 Units by mouth daily.   acetaminophen **OR** acetaminophen, calcium carbonate, docusate sodium, ondansetron **OR** ondansetron (ZOFRAN) IV, oxyCODONE-acetaminophen

## 2018-08-24 NOTE — Consult Note (Signed)
Consultation Note Date: 08/24/2018   Patient Name: [name]  DOB: August 19, 1927  MRN: [id number]  Age / Sex: 85 y.o., male  PCP: Travis Sites, MD Referring Physician: Edwin Johnston, *  Reason for Consultation: Establishing goals of care and Psychosocial/spiritual support  HPI/Patient Profile: 85 y.o. male  with past medical history of HTN/HDL, PVD with left leg stenting 2012, sleep apnea - does not use CPAP, DM - glipizide, hypothyroid, BPH, arthritis, hernia repair 2015, peripheral vascular atherectomy 08/03/2018 admitted on 08/20/2018 with right lower extremity cellulitis with gangrenous toes and PVD.    Clinical Assessment and Goals of Care: I have reviewed medical records including EPIC notes, labs and imaging, received report from hospitalist, assessed Travis patient and then met at Travis bedside along with son [name] to discuss diagnosis prognosis, GOC, EOL wishes, disposition and options.  Present today are hospitalist staff who explained Travis plan of care, answer questions provide support.  After hospitalist staff finishes their rounding and leaves, PMT continues discussions related to goals of care.  I introduced Palliative Medicine as specialized medical care for people living with serious illness. It focuses on providing relief from Travis symptoms and stress of a serious illness. Travis goal is to improve quality of life for both Travis patient and Travis family.  We discussed a brief life review of Travis patient.  Mr. [name] wife passed away 11 years ago.  Per son [name] he has 8 siblings who live [place] and who help bring Mr. [name] food.  [name] tells [place] that Mr. [name] has been sleeping a lot more over Travis last few months.  As far as functional and nutritional status, per staff and son [name], Mr. [name] has siblings who live nearby who help provide food and care.  We discussed her current illness and  what it means in Travis larger context of her on-going co-morbidities.  Natural disease trajectory and expectations at EOL were discussed.  I attempted to elicit values and goals of care important to Travis patient.  I asked Mr. [name] what is important to him.  He tells me that he would like to have Travis blockage fixed, but if not, "click it off".  I share that Travis medical team also wants Travis procedure tomorrow to be success.  I asked him to tell me more about what he means when he states "click it off".  I asked if he is talking about how we should care for him, or amputation.  Mr. [name] tells me that he would not want his foot amputated.  I ask, "even if it means you were to pass away if you do not have it removed?".  At that point Mr. [name] states no he would rather have amputations than lose his life.  Dr. Alvester Johnston arrives as we are talking, I share with Mr. [name] that I will follow-up with him tomorrow after procedure.  Questions and concerns were addressed.  Travis family was encouraged to call with questions or concerns.  PMT to follow-up  after angiogram   HCPOA    NEXT OF KIN -Mr. [name]'s spouse died approximately 11 years ago.  His eldest son, [name], lives in [name].  He has a son [name], and a daughter [name].  Living will document noted on chart.   SUMMARY OF RECOMMENDATIONS   Scheduled for angiogram at 0730 on 8/11. Awaiting outcomes of angiogram PMT to have [name] POA/CODE STATUS discussion   Code Status/Advance Care Planning:  Full code  Symptom Management:   Per hospitalist, no additional needs at this time.  Palliative Prophylaxis:   Frequent Pain Assessment, Palliative Wound Care and Turn Reposition  Additional Recommendations (Limitations, Scope, Preferences):  Full Scope Treatment  Psycho-social/Spiritual:   Desire for further Chaplaincy support:no  Additional Recommendations: Caregiving  Support/Resources  Prognosis:   Unable to determine, based on  outcomes.  Discharge Planning: To be determined, based on outcomes.      Primary Diagnoses: Present on Admission: . Ischemic foot . PAF (paroxysmal atrial fibrillation) ([name]) . Hypothyroidism . Essential hypertension . Cellulitis of great toe of right foot . Atrial fibrillation, chronic . Type 2 diabetes mellitus with foot ulcer, without long-term current use of insulin ([name])   I have reviewed Travis medical record, interviewed Travis patient and family, and examined Travis patient. Travis following aspects are pertinent.  Past Medical History:  Diagnosis Date  . Arthritis   . Borderline diabetic   . Glaucoma   . Hyperlipidemia   . Hypertension   . PVD (peripheral vascular disease) ([name])   . Sleep apnea    Cpap ordered but doesnt use   Social History   Socioeconomic History  . Marital status: Widowed    Spouse name: Not on file  . Number of children: Not on file  . Years of education: Not on file  . Highest education level: Not on file  Occupational History  . Not on file  Social Needs  . Financial resource strain: Not on file  . Food insecurity    Worry: Not on file    Inability: Not on file  . Transportation needs    Medical: Not on file    Non-medical: Not on file  Tobacco Use  . Smoking status: Former Smoker    Quit date: 01/14/1935    Years since quitting: 83.6  . Smokeless tobacco: Former Systems developer    Types: Chew  Substance and Sexual Activity  . Alcohol use: No  . Drug use: No  . Sexual activity: Not on file  Lifestyle  . Physical activity    Days per week: Not on file    Minutes per session: Not on file  . Stress: Not on file  Relationships  . Social Herbalist on phone: Not on file    Gets together: Not on file    Attends religious service: Not on file    Active member of club or organization: Not on file    Attends meetings of clubs or organizations: Not on file    Relationship status: Not on file  Other Topics Concern  . Not on file  Social  History Narrative  . Not on file   Family History  Problem Relation Age of Onset  . Cancer Mother   . Heart attack Father   . Stroke Father   . Heart attack Brother   . Heart attack Brother    Scheduled Meds: . aspirin EC  81 mg Oral Daily  . atorvastatin  40 mg Oral QPC supper  .  dorzolamide-timolol  1 drop Both Eyes BID  . doxazosin  2 mg Oral QHS  . famotidine  20 mg Oral BID  . finasteride  5 mg Oral QPM  . insulin aspart  0-9 Units Subcutaneous TID WC  . levothyroxine  50 mcg Oral Q0600  . pyridOXINE  100 mg Oral Daily  . tamsulosin  0.4 mg Oral BID  . vitamin C  500 mg Oral Daily  . vitamin E  400 Units Oral Daily   Continuous Infusions: . ceFEPime (MAXIPIME) IV 2 g (08/24/18 0750)  . clindamycin (CLEOCIN) IV 600 mg (08/24/18 1311)  . heparin 800 Units/hr (08/24/18 1312)   PRN Meds:.acetaminophen **OR** acetaminophen, calcium carbonate, docusate sodium, ondansetron **OR** ondansetron (ZOFRAN) IV, oxyCODONE-acetaminophen Medications Prior to Admission:  Prior to Admission medications   Medication Sig Start Date End Date Taking? Authorizing Provider  apixaban (ELIQUIS) 2.5 MG TABS tablet Take 1 tablet (2.5 mg total) by mouth 2 (two) times daily. 08/11/17  Yes Pixie Casino, MD  aspirin EC 81 MG EC tablet Take 1 tablet (81 mg total) by mouth daily. Stop taking aspirin 09/04/2018. 08/05/18 09/04/18 Yes Kroeger, Lorelee Cover., PA-C  atorvastatin (LIPITOR) 40 MG tablet TAKE 1 TABLET BY MOUTH ONCE DAILY Patient taking differently: Take 40 mg by mouth daily after supper.  02/17/18  Yes Hilty, Nadean Corwin, MD  calcium carbonate (TUMS - DOSED IN MG ELEMENTAL CALCIUM) 500 MG chewable tablet Chew 1 tablet by mouth as needed for indigestion or heartburn.   Yes [provider]  clopidogrel (PLAVIX) 75 MG tablet Take 1 tablet (75 mg total) by mouth daily with breakfast. 08/05/18  Yes Kroeger, Daleen Snook M., PA-C  docusate sodium (COLACE) 100 MG capsule Take 100 mg by mouth 2 (two) times  daily as needed (for use with oxycodone to prevent constipation.).   Yes [provider]  dorzolamide-timolol (COSOPT) 22.3-6.8 MG/ML ophthalmic solution Place 1 drop into both eyes 2 (two) times daily.   Yes [provider]  doxazosin (CARDURA) 2 MG tablet Take 1 tablet (2 mg total) by mouth at bedtime. 07/08/18  Yes Kroeger, Daleen Snook M., PA-C  finasteride (PROSCAR) 5 MG tablet Take 5 mg by mouth every evening.    Yes [provider]  furosemide (LASIX) 40 MG tablet TAKE 1 TABLET(40 MG) BY MOUTH DAILY Patient taking differently: Take 40 mg by mouth daily. TAKE 1 TABLET(40 MG) BY MOUTH DAILY 07/08/18  Yes Kroeger, Daleen Snook M., PA-C  glipiZIDE (GLUCOTROL) 5 MG tablet Take 5 mg by mouth 2 (two) times daily.    Yes [provider]  isosorbide mononitrate (IMDUR) 30 MG 24 hr tablet Take 0.5 tablets (15 mg total) by mouth daily. 07/27/18  Yes Kroeger, Daleen Snook M., PA-C  levothyroxine (SYNTHROID, LEVOTHROID) 50 MCG tablet Take 50 mcg by mouth daily before breakfast.  07/08/16  Yes [provider]  metoprolol tartrate (LOPRESSOR) 25 MG tablet Take 12.5 mg as needed for palpitations/heart racing Patient taking differently: Take 12.5 mg by mouth daily as needed (palpitations/heart racing). Take 12.5 mg as needed for palpitations/heart racin 07/26/17  Yes Barton Dubois, MD  Omega-3 Fatty Acids (FISH OIL PO) Take 1 capsule by mouth daily.   Yes [provider]  oxyCODONE-acetaminophen (PERCOCET) 5-325 MG tablet Take 1 tablet by mouth every 8 (eight) hours as needed for severe pain. 08/07/18  Yes Kilroy, Luke K, PA-C  pyridOXINE (VITAMIN B-6) 100 MG tablet Take 100 mg by mouth daily.   Yes [provider]  tamsulosin [facility])  0.4 MG CAPS Take 0.4 mg by mouth 2 (two) times daily.    Yes [provider]  vitamin C (ASCORBIC ACID) 500 MG tablet Take 500 mg by mouth daily.   Yes [provider]  vitamin E 400 UNIT capsule Take 400 Units by mouth  daily.   Yes [provider]  diphenhydrAMINE (BENADRYL) 50 MG tablet PLEASE FOLLOW Travis INSTRUCTIONS ON YOUR AFTER VISIT SUMMARY Patient not taking: Reported on 08/20/2018 07/31/18   Lorretta Harp, MD  oxyCODONE-acetaminophen (PERCOCET) 5-325 MG tablet Take 1-2 tablets by mouth every 8 (eight) hours as needed for severe pain. Patient not taking: Reported on 08/20/2018 07/09/18   Mesner, Corene Cornea, MD   Allergies  Allergen Reactions  . Iodinated Diagnostic Agents     Other reaction(s): Fever  . Iodine-131 Other (See Comments)    Breakout, fever  . Iohexol      Code: RASH, Desc: PT STATES HOT ALL OVER HAD TO HAVE ICE PACKS ALL OVER HIM AND DIFF BREATHING, PT REFUSED IV DYE    Review of Systems  Unable to perform ROS: Age    Physical Exam Vitals signs and nursing note reviewed.  Constitutional:      General: He is not in acute distress.    Appearance: He is ill-appearing.     Comments: Alert and oriented x3, calm and cooperative.  Sleepy at times but wakes easily, makes and somewhat keeps eye contact  HENT:     Head: Atraumatic.  Cardiovascular:     Rate and Rhythm: Normal rate.  Pulmonary:     Effort: Pulmonary effort is normal. No respiratory distress.  Abdominal:     General: Abdomen is flat.  Skin:    General: Skin is warm and dry.     Comments: Right great toe black, surrounding area discolored, some mild swelling  Neurological:     Mental Status: He is alert and oriented to person, place, and time.     Motor: Weakness present.  Psychiatric:        Mood and Affect: Mood normal.        Behavior: Behavior normal.        Thought Content: Thought content normal.     Vital Signs: BP (!) 151/77 (BP Location: Right Arm)   Pulse 68   Temp 97.6 F (36.4 C) (Oral)   Resp 18   Ht 5' 11.5" (1.816 m)   Wt 114.8 kg   SpO2 99%   BMI 34.79 kg/m  Pain Scale: 0-10 POSS *See Group Information*: 1-Acceptable,Awake and alert Pain Score: 0-No pain   SpO2: SpO2: 99 % O2  Device:SpO2: 99 % O2 Flow Rate: .   IO: Intake/output summary:   Intake/Output Summary (Last 24 hours) at 08/24/2018 1438 Last data filed at 08/24/2018 1253 Gross per 24 hour  Intake 378.11 ml  Output 1245 ml  Net -866.89 ml    LBM: Last BM Date: 08/20/18(per pt. daughter) Baseline Weight: Weight: 101.1 kg Most recent weight: Weight: 114.8 kg     Palliative Assessment/Data:   Flowsheet Rows     Most Recent Value  Intake Tab  Referral Department  Hospitalist  Unit at Time of Referral  Cardiac/Telemetry Unit  Palliative Care Primary Diagnosis  Other (Comment) [gangrene R great toe]  Date Notified  08/23/18  Palliative Care Type  New Palliative care  Date of Admission  08/20/18  Date first seen by Palliative Care  08/24/18  # of days Palliative referral response time  1 Day(s)  #  of days IP prior to Palliative referral  3  Clinical Assessment  Palliative Performance Scale Score  40%  Pain Max last 24 hours  Not able to report  Pain Min Last 24 hours  Not able to report  Dyspnea Max Last 24 Hours  Not able to report  Dyspnea Min Last 24 hours  Not able to report  Psychosocial & Spiritual Assessment  Palliative Care Outcomes      Time In: 1310 Time Out: 1440 Time Total: 80 minutes Greater than 50%  of this time was spent counseling and coordinating care related to Travis above assessment and plan.  Signed by: Drue Novel, NP   Please contact Palliative Medicine Team phone at [phone] for questions and concerns.  For individual provider: See Shea Evans

## 2018-08-24 NOTE — Progress Notes (Signed)
ANTICOAGULATION CONSULT NOTE - Follow Up  Pharmacy Consult for Heparin Indication: atrial fibrillation  Allergies  Allergen Reactions  . Iodinated Diagnostic Agents     Other reaction(s): Fever  . Iodine-131 Other (See Comments)    Breakout, fever  . Iohexol      Code: RASH, Desc: PT STATES HOT ALL OVER HAD TO HAVE ICE PACKS ALL OVER HIM AND DIFF BREATHING, PT REFUSED IV DYE     Patient Measurements: Height: 5' 11.5" (181.6 cm) Weight: 253 lb (114.8 kg) IBW/kg (Calculated) : 76.45 Heparin Dosing Weight: 97.2 kg  Vital Signs: Temp: 98.6 F (37 C) (08/10 0618) BP: 147/71 (08/10 0618) Pulse Rate: 76 (08/10 0618)  Labs: Recent Labs    08/22/18 0228  08/23/18 0222 08/23/18 0944 08/23/18 1841 08/24/18 0214  HGB 7.9*  --  8.0*  --   --  7.3*  HCT 23.5*  --  23.7*  --   --  21.6*  PLT 356  --  397  --   --  384  APTT 105*   < > 66* 100* 68*  --   HEPARINUNFRC 1.12*  --   --  0.74* 0.41 0.35  CREATININE 1.34*  --  1.27* 1.26*  --  1.20  TROPONINIHS  --   --   --   --   --  26*   < > = values in this interval not displayed.    Estimated Creatinine Clearance: 54.2 mL/min (by C-G formula based on SCr of 1.2 mg/dL).  Assessment: 68 yoM admitted for lower right extremity cellulitis with gangrenous toes. On apixaban 2.5 mg BID for Afib PTA. Last dose PTA apixaban taken 8/6 @0930 . Pharmacy consulted 08/20/18 to dose heparin. 8/8 pt had an bleeding event from an old IV site, but bleeding has resolved per RN.   HL 0.35 remains therapeutic on heparinj drip at 800 units/hr Hgb decreased to 7.3 from 8.0 (stable), Plt WNL. No bleeding or infusion issues per the RN.   Goal of Therapy:  Heparin level 0.3-0.7 units/ml Monitor platelets by anticoagulation protocol: Yes   Plan:  Continue Heparin gtt 800 units/hr - Monitor daily HL, CBC, and s/sx of bleeding  Thank you for involving pharmacy in this patient's care.  Nicole Cella, RPh Clinical Pharmacist [phone] Please check AMION  for all [facility] Pharmacy phone numbers After 10:00 PM, call Advance [phone]

## 2018-08-24 NOTE — Progress Notes (Signed)
PROGRESS NOTE    [name]  NAT:[id number] DOB: 1927-01-30 DOA: 08/20/2018 PCP: [name], MD      Brief Narrative:  [name] is a 85 y.o. M with PVD who presented with right leg redness, pain and worsening black gangrene of the right great toe.  In the ER, patient had pulseless right foot.  Orthopedics and Cardiology/Vascular were consulted.      Assessment & Plan:  Gangrene of the right great toe Peripheral vascular disease The right foot is gangrenous.  The WBC is likely reactive and there is no fever to suggest infection, suspect all redness and pain are from ischemia.  -Stop antibiotics -Consult Cardiology Dr. Gwenlyn Found for angiography -Consult Orthopedics for likely amputation -Consult Palliative Care, appreciate cares  -Continue aspirin -Continue atorvastatin -Continue Plavix   Paroxysmal atrial fibrillation HR controlled -Continue heparin  Hyponatremia Unclear if he is symptomatic from this or from anemia or from ischemic foot. -Check urine and serum osms, urine Na -Repeat BMP tomorrow after blood and Lasix, fluid restrict if necessary  Oozing from IV sites This was noted 8/8-8/9, required pressure dressings.  Plavix held 1 day.  Reviewed with Cardiology today, will need Plavix prior to cath tomorrow.  Acute on chronic anemia Hgb trending slowly down, to 7.3 g/dL today -Transfuse 2 units pre-cath -Lasix between units  CKD stage III Cr stable relative to baseline -IV fluids tonight pre-cath  Hypertension BP low normal -Continue doxasozin -Hold Imdur  Hypothyroidism -Continue levothyroxine  BPH -Continue finasteride, Flomax and doxazosin  Other medications -Continue famotidine  Diabetes Glucoses normal -Continue SSI corrections -Hold Glipizide        MDM and disposition: The below labs and imaging reports were reviewed and summarized above.  Medication management as above.  The patient was admitted with ischemic right foot.   This will need amputation.  Vascular surgery/Cardiology are evaluating the patient for revascularization possibilities prior to amputation.        DVT prophylaxis: N/A on heparin Code Status: FULL Family Communication: Son    Consultants:   Cardiology  Orthopedics  Palliative care    Subjective: Tired.  Right foot hurts.  No chest pain, orthopnea, swelling, weakness, confusion.  Objective: Vitals:   08/24/18 1231 08/24/18 1700 08/24/18 1759 08/24/18 1815  BP: (!) 151/77 127/60 127/60 132/62  Pulse: 68 67 67 72  Resp: 18  18 17   Temp: 97.6 F (36.4 C) 98.4 F (36.9 C) 98.4 F (36.9 C) 98.6 F (37 C)  TempSrc: Oral Oral Oral Oral  SpO2: 99% 98%  98%  Weight:      Height:        Intake/Output Summary (Last 24 hours) at 08/24/2018 1832 Last data filed at 08/24/2018 1253 Gross per 24 hour  Intake 378.11 ml  Output 1245 ml  Net -866.89 ml   Filed Weights   08/21/18 0024 08/23/18 0634  Weight: 101.1 kg 114.8 kg    Examination: General appearance: elderly adult male, sleeping but rousable, no acute distress.   HEENT: Anicteric, conjunctiva pale, lids and lashes normal. No nasal deformity, discharge, epistaxis.  Lips moist, dentures, OP dry, no oral lesions.   Skin: Warm and dry.  Pale, no jaundice.  No suspicious rashes or lesions.  Right great toe black and gangrenous.  Minimal surrousnding redness. Cardiac: RRR, nl K2-H0, loud systolic murmur.  Capillary refill is brisk.  JVP normal.  Trace LE edema.  Radial pulses 2+ and symmetric. Respiratory: Normal respiratory rate and rhythm.  CTAB without  rales or wheezes. Abdomen: Abdomen soft.  No TTP. No ascites, distension, hepatosplenomegaly.   MSK: No deformities or effusions. Neuro:Sleeing but rousable, interactive.  EOMI, moves all extremities very weakly. Speech fluent.    Psych: Sensorium intact and responding to questions, attention diminished, affect bluned.  Judgment and insight appear impaired.    Data  Reviewed: I have personally reviewed following labs and imaging studies:  CBC: Recent Labs  Lab 08/20/18 1257 08/21/18 0742 08/22/18 0228 08/23/18 0222 08/24/18 0214  WBC 13.3* 11.9* 12.5* 13.9* 13.9*  NEUTROABS 9.9* 8.8*  --   --  10.3*  HGB 8.7* 7.7* 7.9* 8.0* 7.3*  HCT 25.5* 22.7* 23.5* 23.7* 21.6*  MCV 92.7 90.8 92.9 91.5 92.3  PLT 372 350 356 397 607   Basic Metabolic Panel: Recent Labs  Lab 08/20/18 1257 08/21/18 0742 08/22/18 0228 08/23/18 0222 08/23/18 0944 08/24/18 0214  NA 124* 126*  --   --  127* 123*  K 3.7 3.6  --   --  3.8 4.2  CL 88* 93*  --   --  94* 92*  CO2 24 23  --   --  23 21*  GLUCOSE 157* 147*  --   --  171* 168*  BUN 21 19  --   --  16 15  CREATININE 1.41* 1.19 1.34* 1.27* 1.26* 1.20  CALCIUM 8.3* 7.9*  --   --  8.2* 8.1*   GFR: Estimated Creatinine Clearance: 54.2 mL/min (by C-G formula based on SCr of 1.2 mg/dL). Liver Function Tests: Recent Labs  Lab 08/20/18 1257 08/21/18 0742  AST 117* 120*  ALT 139* 132*  ALKPHOS 82 76  BILITOT 0.7 0.8  PROT 6.2* 5.3*  ALBUMIN 2.3* 2.0*   No results for input(s): LIPASE, AMYLASE in the last 168 hours. No results for input(s): AMMONIA in the last 168 hours. Coagulation Profile: No results for input(s): INR, PROTIME in the last 168 hours. Cardiac Enzymes: No results for input(s): CKTOTAL, CKMB, CKMBINDEX, TROPONINI in the last 168 hours. BNP (last 3 results) No results for input(s): PROBNP in the last 8760 hours. HbA1C: No results for input(s): HGBA1C in the last 72 hours. CBG: Recent Labs  Lab 08/23/18 1735 08/23/18 2247 08/24/18 0809 08/24/18 1228 08/24/18 1733  GLUCAP 160* 168* 167* 146* 186*   Lipid Profile: No results for input(s): CHOL, HDL, LDLCALC, TRIG, CHOLHDL, LDLDIRECT in the last 72 hours. Thyroid Function Tests: No results for input(s): TSH, T4TOTAL, FREET4, T3FREE, THYROIDAB in the last 72 hours. Anemia Panel: No results for input(s): VITAMINB12, FOLATE, FERRITIN,  TIBC, IRON, RETICCTPCT in the last 72 hours. Urine analysis:    Component Value Date/Time   COLORURINE YELLOW 08/20/2018 2015   APPEARANCEUR CLEAR 08/20/2018 2015   LABSPEC 1.010 08/20/2018 2015   PHURINE 5.0 08/20/2018 2015   GLUCOSEU NEGATIVE 08/20/2018 2015   HGBUR NEGATIVE 08/20/2018 2015   [place] NEGATIVE 08/20/2018 2015   [place] NEGATIVE 08/20/2018 2015   PROTEINUR NEGATIVE 08/20/2018 2015   NITRITE NEGATIVE 08/20/2018 2015   LEUKOCYTESUR NEGATIVE 08/20/2018 2015   Sepsis Labs: @LABRCNTIP (procalcitonin:4,lacticacidven:4)  ) Recent Results (from the past 240 hour(s))  Culture, blood (routine x 2)     Status: None (Preliminary result)   Collection Time: 08/20/18  5:35 PM   Specimen: BLOOD  Result Value Ref Range Status   Specimen Description BLOOD RIGHT ANTECUBITAL  Final   Special Requests   Final    BOTTLES DRAWN AEROBIC AND ANAEROBIC Blood Culture adequate volume   Culture  Final    NO GROWTH 4 DAYS Performed at [facility], [place] [address], [place], [place] [address]    Report Status PENDING  Incomplete  Culture, blood (routine x 2)     Status: None (Preliminary result)   Collection Time: 08/20/18  6:01 PM   Specimen: BLOOD  Result Value Ref Range Status   Specimen Description BLOOD RIGHT ANTECUBITAL  Final   Special Requests   Final    BOTTLES DRAWN AEROBIC AND ANAEROBIC Blood Culture adequate volume   Culture   Final    NO GROWTH 4 DAYS Performed at [facility], [place] [address] [place], [place] [address]    Report Status PENDING  Incomplete  SARS Coronavirus 2 [facility] order, Performed in [facility] hospital lab) Nasopharyngeal Urine, Clean Catch     Status: None   Collection Time: 08/20/18  8:17 PM   Specimen: Urine, Clean Catch; Nasopharyngeal  Result Value Ref Range Status   SARS Coronavirus 2 NEGATIVE NEGATIVE Final    Comment: (NOTE) If result is NEGATIVE SARS-CoV-2 target nucleic acids are NOT DETECTED. The SARS-CoV-2 RNA is  generally detectable in upper and lower  respiratory specimens during the acute phase of infection. The lowest  concentration of SARS-CoV-2 viral copies this assay can detect is 250  copies / mL. A negative result does not preclude SARS-CoV-2 infection  and should not be used as the sole basis for treatment or other  patient management decisions.  A negative result may occur with  improper specimen collection / handling, submission of specimen other  than nasopharyngeal swab, presence of viral mutation(s) within the  areas targeted by this assay, and inadequate number of viral copies  (<250 copies / mL). A negative result must be combined with clinical  observations, patient history, and epidemiological information. If result is POSITIVE SARS-CoV-2 target nucleic acids are DETECTED. The SARS-CoV-2 RNA is generally detectable in upper and lower  respiratory specimens dur ing the acute phase of infection.  Positive  results are indicative of active infection with SARS-CoV-2.  Clinical  correlation with patient history and other diagnostic information is  necessary to determine patient infection status.  Positive results do  not rule out bacterial infection or co-infection with other viruses. If result is PRESUMPTIVE POSTIVE SARS-CoV-2 nucleic acids MAY BE PRESENT.   A presumptive positive result was obtained on the submitted specimen  and confirmed on repeat testing.  While 2019 novel coronavirus  (SARS-CoV-2) nucleic acids may be present in the submitted sample  additional confirmatory testing may be necessary for epidemiological  and / or clinical management purposes  to differentiate between  SARS-CoV-2 and other Sarbecovirus currently known to infect humans.  If clinically indicated additional testing with an alternate test  methodology [phone]) is advised. The SARS-CoV-2 RNA is generally  detectable in upper and lower respiratory sp ecimens during the acute  phase of infection.  The expected result is Negative. Fact Sheet for Patients:  StrictlyIdeas.no Fact Sheet for Healthcare Providers: BankingDealers.co.za This test is not yet approved or cleared by the [place] FDA and has been authorized for detection and/or diagnosis of SARS-CoV-2 by FDA under an Emergency Use Authorization (EUA).  This EUA will remain in effect (meaning this test can be used) for the duration of the COVID-19 declaration under Section 564(b)(1) of the Act, 21 U.S.C. section 360bbb-3(b)(1), unless the authorization is terminated or revoked sooner. Performed at [facility], [place] [address], [place], [place] [address]  Radiology Studies: No results found.      Scheduled Meds: . aspirin EC  81 mg Oral Daily  . atorvastatin  40 mg Oral QPC supper  . clopidogrel  75 mg Oral Daily  . dorzolamide-timolol  1 drop Both Eyes BID  . doxazosin  2 mg Oral QHS  . famotidine  20 mg Oral BID  . finasteride  5 mg Oral QPM  . furosemide  40 mg Intravenous Once  . insulin aspart  0-9 Units Subcutaneous TID WC  . levothyroxine  50 mcg Oral Q0600  . pyridOXINE  100 mg Oral Daily  . tamsulosin  0.4 mg Oral BID  . vitamin C  500 mg Oral Daily  . vitamin E  400 Units Oral Daily   Continuous Infusions: . sodium chloride 40 mL/hr at 08/24/18 1832  . heparin 800 Units/hr (08/24/18 1312)     LOS: 3 days    Time spent: 35 minutes    Edwin Dada, MD [facility] 08/24/2018, 6:32 PM     Please page through [place]:  [email or web address] Password [email or web address] If 7PM-7AM, please contact night-coverage

## 2018-08-24 NOTE — Progress Notes (Addendum)
Pt is for PV angiogram in AM at 0730. With Dr. Fletcher Anon   His Hgb is low at 7.3, per Dr. Gwenlyn Found would recommend 2 Units PRBCs  With lasix between units in preparation for [place] angio tomorrow morning.  Will need to resume plavix as well.

## 2018-08-25 ENCOUNTER — Encounter (HOSPITAL_COMMUNITY)
Admission: EM | Disposition: A | Discharge status: Rehabilitation Facility | Payer: Self-pay | Source: Home / Self Care | Attending: Internal Medicine

## 2018-08-25 ENCOUNTER — Encounter (HOSPITAL_COMMUNITY): Payer: Self-pay | Admitting: Cardiovascular Disease

## 2018-08-25 DIAGNOSIS — I998 Other disorder of circulatory system: Secondary | ICD-10-CM

## 2018-08-25 DIAGNOSIS — I70261 Atherosclerosis of native arteries of extremities with gangrene, right leg: Secondary | ICD-10-CM

## 2018-08-25 HISTORY — PX: LOWER EXTREMITY ANGIOGRAPHY: CATH118251

## 2018-08-25 LAB — CBC
HCT: 24.6 % — ABNORMAL LOW (ref 39.0–52.0)
Hemoglobin: 8.3 g/dL — ABNORMAL LOW (ref 13.0–17.0)
MCH: 31.2 pg (ref 26.0–34.0)
MCHC: 33.7 g/dL (ref 30.0–36.0)
MCV: 92.5 fL (ref 80.0–100.0)
Platelets: 395 10*3/uL (ref 150–400)
RBC: 2.66 MIL/uL — ABNORMAL LOW (ref 4.22–5.81)
RDW: 14.7 % (ref 11.5–15.5)
WBC: 13.7 10*3/uL — ABNORMAL HIGH (ref 4.0–10.5)
nRBC: 0 % (ref 0.0–0.2)

## 2018-08-25 LAB — POCT ACTIVATED CLOTTING TIME: Activated Clotting Time: 263 seconds

## 2018-08-25 LAB — BPAM RBC
Blood Product Expiration Date: 202009032359
Blood Product Expiration Date: 202009032359
ISSUE DATE / TIME: 202008101748
ISSUE DATE / TIME: 202008110309
Unit Type and Rh: 5100
Unit Type and Rh: 5100

## 2018-08-25 LAB — TYPE AND SCREEN
ABO/RH(D): O POS
Antibody Screen: NEGATIVE
Unit division: 0
Unit division: 0

## 2018-08-25 LAB — BASIC METABOLIC PANEL
Anion gap: 9 (ref 5–15)
Anion gap: 13 (ref 5–15)
BUN: 16 mg/dL (ref 8–23)
BUN: 17 mg/dL (ref 8–23)
CO2: 23 mmol/L (ref 22–32)
CO2: 19 mmol/L — ABNORMAL LOW (ref 22–32)
Calcium: 8 mg/dL — ABNORMAL LOW (ref 8.9–10.3)
Calcium: 8.4 mg/dL — ABNORMAL LOW (ref 8.9–10.3)
Chloride: 93 mmol/L — ABNORMAL LOW (ref 98–111)
Chloride: 94 mmol/L — ABNORMAL LOW (ref 98–111)
Creatinine, Ser: 1.26 mg/dL — ABNORMAL HIGH (ref 0.61–1.24)
Creatinine, Ser: 1.23 mg/dL (ref 0.61–1.24)
GFR calc Af Amer: 58 mL/min — ABNORMAL LOW (ref >60)
GFR calc Af Amer: 60 mL/min — ABNORMAL LOW (ref >60)
GFR calc non Af Amer: 50 mL/min — ABNORMAL LOW (ref >60)
GFR calc non Af Amer: 52 mL/min — ABNORMAL LOW (ref >60)
Glucose, Bld: 223 mg/dL — ABNORMAL HIGH (ref 70–99)
Glucose, Bld: 183 mg/dL — ABNORMAL HIGH (ref 70–99)
Potassium: 4.1 mmol/L (ref 3.5–5.1)
Potassium: 5 mmol/L (ref 3.5–5.1)
Sodium: 125 mmol/L — ABNORMAL LOW (ref 135–145)
Sodium: 126 mmol/L — ABNORMAL LOW (ref 135–145)

## 2018-08-25 LAB — GLUCOSE, CAPILLARY
Glucose-Capillary: 237 mg/dL — ABNORMAL HIGH (ref 70–99)
Glucose-Capillary: 272 mg/dL — ABNORMAL HIGH (ref 70–99)
Glucose-Capillary: 294 mg/dL — ABNORMAL HIGH (ref 70–99)

## 2018-08-25 LAB — PREPARE RBC (CROSSMATCH)

## 2018-08-25 LAB — CULTURE, BLOOD (ROUTINE X 2)
Culture: NO GROWTH
Culture: NO GROWTH
Special Requests: ADEQUATE
Special Requests: ADEQUATE

## 2018-08-25 LAB — OSMOLALITY: Osmolality: 283 mOsm/kg (ref 275–295)

## 2018-08-25 LAB — HEPARIN LEVEL (UNFRACTIONATED): Heparin Unfractionated: 0.21 IU/mL — ABNORMAL LOW (ref 0.30–0.70)

## 2018-08-25 SURGERY — LOWER EXTREMITY ANGIOGRAPHY
Anesthesia: LOCAL | Laterality: Right

## 2018-08-25 MED ORDER — FAMOTIDINE IN NACL 20-0.9 MG/50ML-% IV SOLN
20.0000 mg | INTRAVENOUS | Status: DC
  Filled 2018-08-25: qty 50

## 2018-08-25 MED ORDER — FENTANYL CITRATE (PF) 100 MCG/2ML IJ SOLN
INTRAMUSCULAR | Status: AC
  Filled 2018-08-25: qty 2

## 2018-08-25 MED ORDER — HEPARIN SODIUM (PORCINE) 1000 UNIT/ML IJ SOLN
INTRAMUSCULAR | Status: AC
  Filled 2018-08-25: qty 1

## 2018-08-25 MED ORDER — SODIUM CHLORIDE 0.9 % IV SOLN
INTRAVENOUS | Status: DC
  Administered 2018-08-25: 07:00:00 via INTRAVENOUS

## 2018-08-25 MED ORDER — SODIUM CHLORIDE 0.9% FLUSH
3.0000 mL | Freq: Two times a day (BID) | INTRAVENOUS | Status: DC
  Administered 2018-08-25 – 2018-09-06 (×21): 3 mL via INTRAVENOUS

## 2018-08-25 MED ORDER — LIDOCAINE HCL (PF) 1 % IJ SOLN
INTRAMUSCULAR | Status: DC | PRN
  Administered 2018-08-25: 5 mL
  Administered 2018-08-25: 15 mL

## 2018-08-25 MED ORDER — HEPARIN (PORCINE) IN NACL 1000-0.9 UT/500ML-% IV SOLN
INTRAVENOUS | Status: DC | PRN
  Administered 2018-08-25 (×2): 500 mL

## 2018-08-25 MED ORDER — SODIUM CHLORIDE 0.9 % IV SOLN: 250.0000 mL | INTRAVENOUS | Status: DC | PRN

## 2018-08-25 MED ORDER — METHYLPREDNISOLONE SODIUM SUCC 125 MG IJ SOLR
125.0000 mg | INTRAMUSCULAR | Status: AC
  Administered 2018-08-25: 125 mg via INTRAVENOUS
  Filled 2018-08-25: qty 2

## 2018-08-25 MED ORDER — SODIUM CHLORIDE 0.9% IV SOLUTION: Freq: Once | INTRAVENOUS | Status: DC

## 2018-08-25 MED ORDER — HEPARIN (PORCINE) IN NACL 1000-0.9 UT/500ML-% IV SOLN
INTRAVENOUS | Status: AC
  Filled 2018-08-25: qty 1000

## 2018-08-25 MED ORDER — ENOXAPARIN SODIUM 40 MG/0.4ML ~~LOC~~ SOLN
40.0000 mg | SUBCUTANEOUS | Status: DC
  Administered 2018-08-26 – 2018-09-07 (×12): 40 mg via SUBCUTANEOUS
  Filled 2018-08-25 (×13): qty 0.4

## 2018-08-25 MED ORDER — IOHEXOL 350 MG/ML SOLN
INTRAVENOUS | Status: DC | PRN
  Administered 2018-08-25: 65 mL via INTRA_ARTERIAL

## 2018-08-25 MED ORDER — HEPARIN SODIUM (PORCINE) 1000 UNIT/ML IJ SOLN
INTRAMUSCULAR | Status: DC | PRN
  Administered 2018-08-25: 2000 [IU] via INTRAVENOUS
  Administered 2018-08-25: 3000 [IU] via INTRAVENOUS
  Administered 2018-08-25: 10000 [IU] via INTRAVENOUS

## 2018-08-25 MED ORDER — DIPHENHYDRAMINE HCL 50 MG/ML IJ SOLN
25.0000 mg | INTRAMUSCULAR | Status: AC
  Administered 2018-08-25: 25 mg via INTRAVENOUS
  Filled 2018-08-25: qty 1

## 2018-08-25 MED ORDER — NITROGLYCERIN 1 MG/10 ML FOR IR/CATH LAB
INTRA_ARTERIAL | Status: AC
  Filled 2018-08-25: qty 10

## 2018-08-25 MED ORDER — VERAPAMIL HCL 2.5 MG/ML IV SOLN
INTRAVENOUS | Status: DC | PRN
  Administered 2018-08-25: 10 mL via INTRA_ARTERIAL

## 2018-08-25 MED ORDER — FENTANYL CITRATE (PF) 100 MCG/2ML IJ SOLN
INTRAMUSCULAR | Status: DC | PRN
  Administered 2018-08-25: 25 ug via INTRAVENOUS
  Administered 2018-08-25: 12.5 ug via INTRAVENOUS

## 2018-08-25 MED ORDER — IODIXANOL 320 MG/ML IV SOLN
INTRAVENOUS | Status: DC | PRN
  Administered 2018-08-25: 50 mL via INTRA_ARTERIAL

## 2018-08-25 MED ORDER — SODIUM CHLORIDE 0.9% IV SOLUTION
Freq: Once | INTRAVENOUS | Status: AC
  Administered 2018-08-25: 05:00:00 via INTRAVENOUS

## 2018-08-25 MED ORDER — NITROGLYCERIN 1 MG/10 ML FOR IR/CATH LAB
INTRA_ARTERIAL | Status: DC | PRN
  Administered 2018-08-25: 200 ug
  Administered 2018-08-25: 400 ug

## 2018-08-25 MED ORDER — SODIUM CHLORIDE 0.9% FLUSH: 3.0000 mL | INTRAVENOUS | Status: DC | PRN

## 2018-08-25 MED ORDER — SODIUM CHLORIDE 0.9 % IV SOLN: INTRAVENOUS | Status: AC

## 2018-08-25 MED ORDER — VERAPAMIL HCL 2.5 MG/ML IV SOLN
INTRAVENOUS | Status: AC
  Filled 2018-08-25: qty 2

## 2018-08-25 MED ORDER — LIDOCAINE HCL (PF) 1 % IJ SOLN
INTRAMUSCULAR | Status: AC
  Filled 2018-08-25: qty 30

## 2018-08-25 SURGICAL SUPPLY — 33 items
BALLN COYOTE OTW 3.5X150X150 (BALLOONS) ×2
BALLN COYOTE OTW 3X120X150 (BALLOONS) ×2
BALLOON COYOTE OTW 3.5X150X150 (BALLOONS) ×1 IMPLANT
BALLOON COYOTE OTW 3X120X150 (BALLOONS) ×1 IMPLANT
CATH CROSS OVER TEMPO 5F (CATHETERS) ×2 IMPLANT
CATH CXI 2.3F 150 ST (CATHETERS) ×2 IMPLANT
CATH CXI 2.3F 65 ANG 2 (CATHETERS) ×2 IMPLANT
CATH CXI 2.3F 65 ST (CATHETERS) ×2 IMPLANT
CATH STRAIGHT 5FR 65CM (CATHETERS) ×2 IMPLANT
CATH TELEPORT (CATHETERS) ×2 IMPLANT
CATH TEMPO AQUA 5F 100CM (CATHETERS) ×2 IMPLANT
CLOSURE MYNX CONTROL 6F/7F (Vascular Products) ×2 IMPLANT
HOVERMATT SINGLE USE (MISCELLANEOUS) ×2 IMPLANT
KIT ENCORE 26 ADVANTAGE (KITS) ×2 IMPLANT
KIT MICROPUNCTURE NIT STIFF (SHEATH) ×2 IMPLANT
KIT PV (KITS) ×2 IMPLANT
PAD PRO RADIOLUCENT 2001M-C (PAD) ×2 IMPLANT
PATCH THROMBIX TOPICAL PLAIN (HEMOSTASIS) ×2 IMPLANT
SHEATH MICROPUNCTURE PEDAL 4FR (SHEATH) ×2 IMPLANT
SHEATH PINNACLE 5F 10CM (SHEATH) ×2 IMPLANT
SHEATH PINNACLE 6F 10CM (SHEATH) ×2 IMPLANT
SHEATH PINNACLE ST 6F 45CM (SHEATH) ×2 IMPLANT
SHEATH PROBE COVER 6X72 (BAG) ×2 IMPLANT
STOPCOCK MORSE 400PSI 3WAY (MISCELLANEOUS) ×2 IMPLANT
SYR MEDRAD MARK 7 150ML (SYRINGE) ×2 IMPLANT
TAPE VIPERTRACK RADIOPAQ (MISCELLANEOUS) ×1 IMPLANT
TAPE VIPERTRACK RADIOPAQUE (MISCELLANEOUS) ×1
TRANSDUCER W/STOPCOCK (MISCELLANEOUS) ×2 IMPLANT
TRAY PV CATH (CUSTOM PROCEDURE TRAY) ×2 IMPLANT
TUBING CIL FLEX 10 FLL-RA (TUBING) ×2 IMPLANT
WIRE APROACH 18G .014X300CM (WIRE) ×2 IMPLANT
WIRE BENTSON .035X145CM (WIRE) ×2 IMPLANT
WIRE TORQFLEX AUST .018X40CM (WIRE) ×2 IMPLANT

## 2018-08-25 NOTE — Progress Notes (Signed)
PROGRESS NOTE    [name]  XLK:[id number]  DOB: [birth year]  DOA: 08/20/2018 PCP: [name], MD  Brief Narrative:  85 year old male with history of hypertension, hyperlipidemia, peripheral vascular disease, underwent stent placement to left SFA in 2012.  Patient developed infection after toenail was clipped in April 2020 requiring multiple rounds of antibiotics also worsening of the lower extremity edema.  Ultrasound showed ABI of 0.43 on the right with 0 vessel runoff.  Underwent peripheral angiography on 07/27/2018 by Dr. Alvester Chou revealed mid to distal left SFA stent patent, high-grade calcified disease under 0 vessel runoff on the left as well as on the right with a 99% calcified lesions in the distal right SFA/above-knee popliteal artery.  Underwent angioplasty followed by drug-coated balloon angioplasty to the distal right SFA.  Scented to emergency department on 08/20/2018 with worsening of the pain over the last 1 month with a discoloration of the foot.  Patient was initiated on broad-spectrum antibiotics in the emergency department.  Patient's initial lab work-up showed sodium of 124, creatinine of 1.4.  Patient underwent right lower extremity angiography with angioplasty on 08/25/2018.  Postprocedure patient was noted to have a hemoglobin of 7.3, received 2 units of packed RBC.  ##Right lower extremity cellulitis with dry gangrenous toes -Continue with cefepime, clindamycin -Continue with heparin drip -Continue with Plavix -Orthopedic surgery is on board  ##Peripheral vascular disease -S/p PTCA in 2012 -Underwent angiogram, angioplasty on 07/27/2018 by Dr. Alvester Chou, has been compliant with medications -Admitted with gangrenous toes -Underwent angiogram on 08/25/2018 with angioplasty  ##Anemia acute on chronic blood loss -Hemoglobin trended down to 7.3 -Received 2 units of packed RBC on 08/25/2011 -Keep the patient on IV iron  ##Encephalopathy metabolic/toxic -Patient is back from  the procedure -Closely follow-up  ##Hypertension -Continue with home medications  ##Hypothyroidism -Continue with Synthroid  ##Acute on chronic kidney disease stage III -Closely follow-up with the BMP after the contrast given  ##Aortic valve stenosis -Patient has moderate stenosis  ##Diabetes mellitus type 2 -Follow-up on sliding scale insulin   DVT prophylaxis: Anticoagulation as above Code Status: Full code Family / Patient Communication: Patient's son is at bedside Disposition Plan: TBD. Will likely need SNF rehab    Subjective: Patient is somnolent, confused.  Patient's son is at bedside. Objective: Vitals:   08/25/18 1205 08/25/18 1220 08/25/18 1303 08/25/18 1400  BP: (!) 154/66 (!) 163/114 (!) 156/54 (!) 161/77  Pulse: 67 68 61 66  Resp: 14 11 13 18   Temp:      TempSrc:      SpO2: 95% 94% 97% 96%  Weight:      Height:        Intake/Output Summary (Last 24 hours) at 08/25/2018 1745 Last data filed at 08/25/2018 1500 Gross per 24 hour  Intake 465.5 ml  Output 1600 ml  Net -1134.5 ml   Filed Weights   08/21/18 0024 08/23/18 0634  Weight: 101.1 kg 114.8 kg    Physical Examination:  General exam: Appears calm and comfortable  Respiratory system: Clear to auscultation. Respiratory effort normal. Cardiovascular system: S1 & S2 heard, RRR. No JVD, murmurs, rubs, gallops or clicks. No pedal edema. Gastrointestinal system: Abdomen is nondistended, soft and nontender. No organomegaly or masses felt. Normal bowel sounds heard. Central nervous system: Alert and oriented. No focal neurological deficits. Extremities: Symmetric 5 x 5 power. Skin: Gangrenous changes to the right foot with surrounding cellulitis as shown below.  Also has right heel wound Psychiatry: Judgement and insight appear  normal. Mood & affect appropriate.          Data Reviewed: I have personally reviewed following labs and imaging studies  CBC: Recent Labs  Lab 08/20/18 1257  08/21/18 0742 08/22/18 0228 08/23/18 0222 08/24/18 0214 08/25/18 0139  WBC 13.3* 11.9* 12.5* 13.9* 13.9* 13.7*  NEUTROABS 9.9* 8.8*  --   --  10.3*  --   HGB 8.7* 7.7* 7.9* 8.0* 7.3* 8.3*  HCT 25.5* 22.7* 23.5* 23.7* 21.6* 24.6*  MCV 92.7 90.8 92.9 91.5 92.3 92.5  PLT 372 350 356 397 384 854   Basic Metabolic Panel: Recent Labs  Lab 08/21/18 0742  08/23/18 0222 08/23/18 0944 08/24/18 0214 08/25/18 0139 08/25/18 0717  NA 126*  --   --  127* 123* 125* 126*  K 3.6  --   --  3.8 4.2 4.1 5.0  CL 93*  --   --  94* 92* 93* 94*  CO2 23  --   --  23 21* 23 19*  GLUCOSE 147*  --   --  171* 168* 223* 183*  BUN 19  --   --  16 15 16 17   CREATININE 1.19   < > 1.27* 1.26* 1.20 1.26* 1.23  CALCIUM 7.9*  --   --  8.2* 8.1* 8.0* 8.4*   < > = values in this interval not displayed.   GFR: Estimated Creatinine Clearance: 52.9 mL/min (by C-G formula based on SCr of 1.23 mg/dL). Liver Function Tests: Recent Labs  Lab 08/20/18 1257 08/21/18 0742  AST 117* 120*  ALT 139* 132*  ALKPHOS 82 76  BILITOT 0.7 0.8  PROT 6.2* 5.3*  ALBUMIN 2.3* 2.0*   No results for input(s): LIPASE, AMYLASE in the last 168 hours. No results for input(s): AMMONIA in the last 168 hours. Coagulation Profile: No results for input(s): INR, PROTIME in the last 168 hours. Cardiac Enzymes: No results for input(s): CKTOTAL, CKMB, CKMBINDEX, TROPONINI in the last 168 hours. BNP (last 3 results) No results for input(s): PROBNP in the last 8760 hours. HbA1C: No results for input(s): HGBA1C in the last 72 hours. CBG: Recent Labs  Lab 08/24/18 1228 08/24/18 1733 08/24/18 2200 08/25/18 1302 08/25/18 1644  GLUCAP 146* 186* 224* 237* 272*   Lipid Profile: No results for input(s): CHOL, HDL, LDLCALC, TRIG, CHOLHDL, LDLDIRECT in the last 72 hours. Thyroid Function Tests: No results for input(s): TSH, T4TOTAL, FREET4, T3FREE, THYROIDAB in the last 72 hours. Anemia Panel: No results for input(s): VITAMINB12,  FOLATE, FERRITIN, TIBC, IRON, RETICCTPCT in the last 72 hours. Sepsis Labs: Recent Labs  Lab 08/20/18 1257 08/20/18 1735  LATICACIDVEN 1.7 1.8    Recent Results (from the past 240 hour(s))  Culture, blood (routine x 2)     Status: None   Collection Time: 08/20/18  5:35 PM   Specimen: BLOOD  Result Value Ref Range Status   Specimen Description BLOOD RIGHT ANTECUBITAL  Final   Special Requests   Final    BOTTLES DRAWN AEROBIC AND ANAEROBIC Blood Culture adequate volume   Culture   Final    NO GROWTH 5 DAYS Performed at [facility], [address] [place], [place] [address]    Report Status 08/25/2018 FINAL  Final  Culture, blood (routine x 2)     Status: None   Collection Time: 08/20/18  6:01 PM   Specimen: BLOOD  Result Value Ref Range Status   Specimen Description BLOOD RIGHT ANTECUBITAL  Final   Special Requests  Final    BOTTLES DRAWN AEROBIC AND ANAEROBIC Blood Culture adequate volume   Culture   Final    NO GROWTH 5 DAYS Performed at [facility], [place] [address], [place], [place] [address]    Report Status 08/25/2018 FINAL  Final  SARS Coronavirus 2 [facility] order, Performed in [facility] hospital lab) Nasopharyngeal Urine, Clean Catch     Status: None   Collection Time: 08/20/18  8:17 PM   Specimen: Urine, Clean Catch; Nasopharyngeal  Result Value Ref Range Status   SARS Coronavirus 2 NEGATIVE NEGATIVE Final    Comment: (NOTE) If result is NEGATIVE SARS-CoV-2 target nucleic acids are NOT DETECTED. The SARS-CoV-2 RNA is generally detectable in upper and lower  respiratory specimens during the acute phase of infection. The lowest  concentration of SARS-CoV-2 viral copies this assay can detect is 250  copies / mL. A negative result does not preclude SARS-CoV-2 infection  and should not be used as the sole basis for treatment or other  patient management decisions.  A negative result may occur with  improper specimen collection / handling, submission  of specimen other  than nasopharyngeal swab, presence of viral mutation(s) within the  areas targeted by this assay, and inadequate number of viral copies  (<250 copies / mL). A negative result must be combined with clinical  observations, patient history, and epidemiological information. If result is POSITIVE SARS-CoV-2 target nucleic acids are DETECTED. The SARS-CoV-2 RNA is generally detectable in upper and lower  respiratory specimens dur ing the acute phase of infection.  Positive  results are indicative of active infection with SARS-CoV-2.  Clinical  correlation with patient history and other diagnostic information is  necessary to determine patient infection status.  Positive results do  not rule out bacterial infection or co-infection with other viruses. If result is PRESUMPTIVE POSTIVE SARS-CoV-2 nucleic acids MAY BE PRESENT.   A presumptive positive result was obtained on the submitted specimen  and confirmed on repeat testing.  While 2019 novel coronavirus  (SARS-CoV-2) nucleic acids may be present in the submitted sample  additional confirmatory testing may be necessary for epidemiological  and / or clinical management purposes  to differentiate between  SARS-CoV-2 and other Sarbecovirus currently known to infect humans.  If clinically indicated additional testing with an alternate test  methodology [phone]) is advised. The SARS-CoV-2 RNA is generally  detectable in upper and lower respiratory sp ecimens during the acute  phase of infection. The expected result is Negative. Fact Sheet for Patients:  StrictlyIdeas.no Fact Sheet for Healthcare Providers: BankingDealers.co.za This test is not yet approved or cleared by the [place] FDA and has been authorized for detection and/or diagnosis of SARS-CoV-2 by FDA under an Emergency Use Authorization (EUA).  This EUA will remain in effect (meaning this test can be used) for  the duration of the COVID-19 declaration under Section 564(b)(1) of the Act, 21 U.S.C. section 360bbb-3(b)(1), unless the authorization is terminated or revoked sooner. Performed at [facility], [place] [address], [place], [place] [address]       Radiology Studies: No results found.      Scheduled Meds: . sodium chloride   Intravenous Once  . sodium chloride   Intravenous Once  . aspirin EC  81 mg Oral Daily  . atorvastatin  40 mg Oral QPC supper  . clopidogrel  75 mg Oral Daily  . dorzolamide-timolol  1 drop Both Eyes BID  . doxazosin  2 mg Oral QHS  . [  START ON 08/26/2018] enoxaparin (LOVENOX) injection  40 mg Subcutaneous Q24H  . famotidine  20 mg Oral BID  . finasteride  5 mg Oral QPM  . insulin aspart  0-9 Units Subcutaneous TID WC  . levothyroxine  50 mcg Oral Q0600  . pyridOXINE  100 mg Oral Daily  . sodium chloride flush  3 mL Intravenous Q12H  . tamsulosin  0.4 mg Oral BID  . vitamin C  500 mg Oral Daily  . vitamin E  400 Units Oral Daily   Continuous Infusions: . sodium chloride 75 mL/hr at 08/25/18 1500  . sodium chloride          LOS: 4 days    Time spent: 35 minutes    Aby Gessel, MD [facility] Pager [phone]  If 7PM-7AM, please contact night-coverage [email or web address] Password [facility] 08/25/2018, 5:45 PM

## 2018-08-25 NOTE — Progress Notes (Signed)
ANTICOAGULATION CONSULT NOTE - Follow Up  Pharmacy Consult for Heparin Indication: atrial fibrillation  Allergies  Allergen Reactions  . Iodinated Diagnostic Agents     Other reaction(s): Fever  . Iodine-131 Other (See Comments)    Breakout, fever  . Iohexol      Code: RASH, Desc: PT STATES HOT ALL OVER HAD TO HAVE ICE PACKS ALL OVER HIM AND DIFF BREATHING, PT REFUSED IV DYE     Patient Measurements: Height: 5' 11.5" (181.6 cm) Weight: 253 lb (114.8 kg) IBW/kg (Calculated) : 76.45 Heparin Dosing Weight: 97.2 kg  Vital Signs: Temp: 98.1 F (36.7 C) (08/11 0045) Temp Source: Oral (08/10 1815) BP: 133/64 (08/11 0045) Pulse Rate: 63 (08/11 0045)  Labs: Recent Labs    08/23/18 0222  08/23/18 0944 08/23/18 1841 08/24/18 0214 08/25/18 0139  HGB 8.0*  --   --   --  7.3* 8.3*  HCT 23.7*  --   --   --  21.6* 24.6*  PLT 397  --   --   --  384 395  APTT 66*  --  100* 68*  --   --   HEPARINUNFRC  --    < > 0.74* 0.41 0.35 0.21*  CREATININE 1.27*  --  1.26*  --  1.20 1.26*  TROPONINIHS  --   --   --   --  26*  --    < > = values in this interval not displayed.    Estimated Creatinine Clearance: 51.6 mL/min (A) (by C-G formula based on SCr of 1.26 mg/dL (H)).  Assessment: 67 yoM admitted for lower right extremity cellulitis with gangrenous toes. On apixaban 2.5 mg BID for Afib PTA. Last dose PTA apixaban taken 8/6 @0930 . Pharmacy consulted 08/20/18 to dose heparin. 8/8 pt had an bleeding event from an old IV site, but bleeding has resolved per RN.   HL 0.21 this am.  IV was out earlier last night but only for 15 min Goal of Therapy:  Heparin level 0.3-0.7 units/ml Monitor platelets by anticoagulation protocol: Yes   Plan:  Increase Heparin gtt to 900 units/hr - Monitor daily HL, CBC, and s/sx of bleeding  Thanks for allowing pharmacy to be a part of this patient's care.  Excell Seltzer, PharmD Clinical Pharmacist

## 2018-08-25 NOTE — Progress Notes (Signed)
Pt leaves cath lab holding area in stable condition. Lt groin is unremarkable. Dressing is CDI to Lt groin.

## 2018-08-25 NOTE — Progress Notes (Signed)
I went to get patient second unit of blood and was notified by blood bank that patient blood typing screen was expired and new order was need for typing screen and transfusion. New order received and waiting on blood bank to pick up blood.

## 2018-08-25 NOTE — Progress Notes (Signed)
Notified on call NP for patient expired typing screen. Patient need a new typing screen before he can receive the second unit of blood

## 2018-08-25 NOTE — Interval H&P Note (Signed)
History and Physical Interval Note: The patient presents with gangrenous right big toe with known history of extensive peripheral arterial disease.  He underwent recent successful atherectomy and balloon angioplasty of the right popliteal artery by Dr. Gwenlyn Found.  The patient has known severe below the knee disease with occluded tibial vessels.  It appears from the recent angiogram that his anterior tibial artery occlusion might be relatively short and it extends all the way down to the foot.  The plan is to try to revascularize anterior tibial artery via the antegrade or retrograde fashion if needed after performing diagnostic angiography.  The patient will require some form of amputation after today's procedure.  If were able to establish inline flow then transmetatarsal amputation might be sufficient.  Otherwise, the patient will require below the knee amputation.  08/25/2018 7:51 AM  [name]  has presented today for surgery, with the diagnosis of gang green.  The various methods of treatment have been discussed with the patient and family. After consideration of risks, benefits and other options for treatment, the patient has consented to  Procedure(s): LOWER EXTREMITY ANGIOGRAPHY (Right) as a surgical intervention.  The patient's history has been reviewed, patient examined, no change in status, stable for surgery.  I have reviewed the patient's chart and labs.  Questions were answered to the patient's satisfaction.     Kathlyn [place]

## 2018-08-26 DIAGNOSIS — I350 Nonrheumatic aortic (valve) stenosis: Secondary | ICD-10-CM

## 2018-08-26 LAB — GLUCOSE, CAPILLARY
Glucose-Capillary: 284 mg/dL — ABNORMAL HIGH (ref 70–99)
Glucose-Capillary: 261 mg/dL — ABNORMAL HIGH (ref 70–99)
Glucose-Capillary: 248 mg/dL — ABNORMAL HIGH (ref 70–99)
Glucose-Capillary: 223 mg/dL — ABNORMAL HIGH (ref 70–99)

## 2018-08-26 LAB — CBC
HCT: 26.6 % — ABNORMAL LOW (ref 39.0–52.0)
Hemoglobin: 8.9 g/dL — ABNORMAL LOW (ref 13.0–17.0)
MCH: 30.8 pg (ref 26.0–34.0)
MCHC: 33.5 g/dL (ref 30.0–36.0)
MCV: 92 fL (ref 80.0–100.0)
Platelets: 368 10*3/uL (ref 150–400)
RBC: 2.89 MIL/uL — ABNORMAL LOW (ref 4.22–5.81)
RDW: 14.9 % (ref 11.5–15.5)
WBC: 19.1 10*3/uL — ABNORMAL HIGH (ref 4.0–10.5)
nRBC: 0 % (ref 0.0–0.2)

## 2018-08-26 LAB — CREATININE, SERUM
Creatinine, Ser: 1.18 mg/dL (ref 0.61–1.24)
GFR calc Af Amer: >60 mL/min (ref >60)
GFR calc non Af Amer: 54 mL/min — ABNORMAL LOW (ref >60)

## 2018-08-26 NOTE — Progress Notes (Signed)
PROGRESS NOTE    [name]  SAY:[id number]  DOB: [birth year]  DOA: 08/20/2018 PCP: [name], MD  Brief Narrative:  85 year old male with history of hypertension, hyperlipidemia, peripheral vascular disease, underwent stent placement to left SFA in 2012.  Patient developed infection after toenail was clipped in April 2020 requiring multiple rounds of antibiotics also worsening of the lower extremity edema.  Ultrasound showed ABI of 0.43 on the right with 0 vessel runoff.  Underwent peripheral angiography on 07/27/2018 by Dr. Alvester Chou revealed mid to distal left SFA stent patent, high-grade calcified disease under 0 vessel runoff on the left as well as on the right with a 99% calcified lesions in the distal right SFA/above-knee popliteal artery.  Underwent angioplasty followed by drug-coated balloon angioplasty to the distal right SFA.  Scented to emergency department on 08/20/2018 with worsening of the pain over the last 1 month with a discoloration of the foot.  Patient was initiated on broad-spectrum antibiotics in the emergency department.  Patient's initial lab work-up showed sodium of 124, creatinine of 1.4.  Patient underwent right lower extremity angiography with angioplasty on 08/25/2018.  Postprocedure patient was noted to have a hemoglobin of 7.3, received 2 units of packed RBC.  ##Right lower extremity cellulitis with dry gangrenous toes -Continue with cefepime, clindamycin -Continue with heparin drip -Continue with Plavix -Orthopedic surgery is on board, plan to do transmetatarsal amputation of the gangrenous toes  ##Peripheral vascular disease -S/p PTCA in 2012 -Underwent angiogram, angioplasty on 07/27/2018 by Dr. Alvester Chou, has been compliant with medications -Admitted with gangrenous toes -Underwent angiogram on 08/25/2018 with angioplasty of anterior tibial artery with balloon angioplasty, post to revascularization appeared to be good inline flow all the way to the toes. -Continue  with DAPT with aspirin, Plavix and statin.  ##Anemia acute on chronic blood loss -Hemoglobin trended down to 7.3 -Received 2 units of packed RBC on 08/25/2011 -Keep the patient on IV iron  ##Encephalopathy metabolic/toxic -Patient is back from the procedure -Closely follow-up  ##Hypertension -Continue with home medications  ##Hypothyroidism -Continue with Synthroid  ##Acute on chronic kidney disease stage III -Closely follow-up with the BMP after the contrast given  ##Aortic valve stenosis -Patient has moderate stenosis  ##Diabetes mellitus type 2 -Follow-up on sliding scale insulin   DVT prophylaxis: Anticoagulation as above Code Status: Full code Family / Patient Communication: Patient's son is at bedside Disposition Plan: TBD. Will likely need SNF rehab    Subjective: Patient is awake, alert.  He ate breakfast.  Denies any pain in the right lower extremity.  Son is at bedside. Objective: Vitals:   08/25/18 2042 08/25/18 2240 08/26/18 0444 08/26/18 1259  BP: 126/60 128/64 (!) 143/71 (!) 147/63  Pulse: (!) 53  (!) 56 (!) 59  Resp: 14  12   Temp: 98 F (36.7 C)  97.8 F (36.6 C) 97.8 F (36.6 C)  TempSrc: Oral  Axillary Oral  SpO2: 96%  97% 99%  Weight:   116.1 kg   Height:        Intake/Output Summary (Last 24 hours) at 08/26/2018 1542 Last data filed at 08/26/2018 0800 Gross per 24 hour  Intake 240 ml  Output 500 ml  Net -260 ml   Filed Weights   08/21/18 0024 08/23/18 0634 08/26/18 0444  Weight: 101.1 kg 114.8 kg 116.1 kg    Physical Examination:  General exam: Appears calm and comfortable  Respiratory system: Clear to auscultation. Respiratory effort normal. Cardiovascular system: S1 & S2 heard, RRR. No  JVD, murmurs, rubs, gallops or clicks. No pedal edema. Gastrointestinal system: Abdomen is nondistended, soft and nontender. No organomegaly or masses felt. Normal bowel sounds heard. Central nervous system: Alert and oriented. No focal  neurological deficits. Extremities: Symmetric 5 x 5 power. Skin: Gangrenous changes to the right foot with surrounding cellulitis as shown below.  Also has right heel wound Psychiatry: Judgement and insight appear normal. Mood & affect appropriate.          Data Reviewed: I have personally reviewed following labs and imaging studies  CBC: Recent Labs  Lab 08/20/18 1257 08/21/18 0742 08/22/18 0228 08/23/18 0222 08/24/18 0214 08/25/18 0139 08/26/18 0509  WBC 13.3* 11.9* 12.5* 13.9* 13.9* 13.7* 19.1*  NEUTROABS 9.9* 8.8*  --   --  10.3*  --   --   HGB 8.7* 7.7* 7.9* 8.0* 7.3* 8.3* 8.9*  HCT 25.5* 22.7* 23.5* 23.7* 21.6* 24.6* 26.6*  MCV 92.7 90.8 92.9 91.5 92.3 92.5 92.0  PLT 372 350 356 397 384 395 299   Basic Metabolic Panel: Recent Labs  Lab 08/21/18 0742  08/23/18 0944 08/24/18 0214 08/25/18 0139 08/25/18 0717 08/26/18 0509  NA 126*  --  127* 123* 125* 126*  --   K 3.6  --  3.8 4.2 4.1 5.0  --   CL 93*  --  94* 92* 93* 94*  --   CO2 23  --  23 21* 23 19*  --   GLUCOSE 147*  --  171* 168* 223* 183*  --   BUN 19  --  16 15 16 17   --   CREATININE 1.19   < > 1.26* 1.20 1.26* 1.23 1.18  CALCIUM 7.9*  --  8.2* 8.1* 8.0* 8.4*  --    < > = values in this interval not displayed.   GFR: Estimated Creatinine Clearance: 55.4 mL/min (by C-G formula based on SCr of 1.18 mg/dL). Liver Function Tests: Recent Labs  Lab 08/20/18 1257 08/21/18 0742  AST 117* 120*  ALT 139* 132*  ALKPHOS 82 76  BILITOT 0.7 0.8  PROT 6.2* 5.3*  ALBUMIN 2.3* 2.0*   No results for input(s): LIPASE, AMYLASE in the last 168 hours. No results for input(s): AMMONIA in the last 168 hours. Coagulation Profile: No results for input(s): INR, PROTIME in the last 168 hours. Cardiac Enzymes: No results for input(s): CKTOTAL, CKMB, CKMBINDEX, TROPONINI in the last 168 hours. BNP (last 3 results) No results for input(s): PROBNP in the last 8760 hours. HbA1C: No results for input(s): HGBA1C in  the last 72 hours. CBG: Recent Labs  Lab 08/25/18 1302 08/25/18 1644 08/25/18 2142 08/26/18 0751 08/26/18 1111  GLUCAP 237* 272* 294* 284* 261*   Lipid Profile: No results for input(s): CHOL, HDL, LDLCALC, TRIG, CHOLHDL, LDLDIRECT in the last 72 hours. Thyroid Function Tests: No results for input(s): TSH, T4TOTAL, FREET4, T3FREE, THYROIDAB in the last 72 hours. Anemia Panel: No results for input(s): VITAMINB12, FOLATE, FERRITIN, TIBC, IRON, RETICCTPCT in the last 72 hours. Sepsis Labs: Recent Labs  Lab 08/20/18 1257 08/20/18 1735  LATICACIDVEN 1.7 1.8    Recent Results (from the past 240 hour(s))  Culture, blood (routine x 2)     Status: None   Collection Time: 08/20/18  5:35 PM   Specimen: BLOOD  Result Value Ref Range Status   Specimen Description BLOOD RIGHT ANTECUBITAL  Final   Special Requests   Final    BOTTLES DRAWN AEROBIC AND ANAEROBIC Blood Culture adequate volume   Culture  Final    NO GROWTH 5 DAYS Performed at [facility], [place] [address], [place], [place] [address]    Report Status 08/25/2018 FINAL  Final  Culture, blood (routine x 2)     Status: None   Collection Time: 08/20/18  6:01 PM   Specimen: BLOOD  Result Value Ref Range Status   Specimen Description BLOOD RIGHT ANTECUBITAL  Final   Special Requests   Final    BOTTLES DRAWN AEROBIC AND ANAEROBIC Blood Culture adequate volume   Culture   Final    NO GROWTH 5 DAYS Performed at [facility], [place] [address] [place], [place] [address]    Report Status 08/25/2018 FINAL  Final  SARS Coronavirus 2 Procedure [facility] order, Performed in [facility] hospital lab) Nasopharyngeal Urine, Clean Catch     Status: None   Collection Time: 08/20/18  8:17 PM   Specimen: Urine, Clean Catch; Nasopharyngeal  Result Value Ref Range Status   SARS Coronavirus 2 NEGATIVE NEGATIVE Final    Comment: (NOTE) If result is NEGATIVE SARS-CoV-2 target nucleic acids are NOT DETECTED. The SARS-CoV-2 RNA is generally  detectable in upper and lower  respiratory specimens during the acute phase of infection. The lowest  concentration of SARS-CoV-2 viral copies this assay can detect is 250  copies / mL. A negative result does not preclude SARS-CoV-2 infection  and should not be used as the sole basis for treatment or other  patient management decisions.  A negative result may occur with  improper specimen collection / handling, submission of specimen other  than nasopharyngeal swab, presence of viral mutation(s) within the  areas targeted by this assay, and inadequate number of viral copies  (<250 copies / mL). A negative result must be combined with clinical  observations, patient history, and epidemiological information. If result is POSITIVE SARS-CoV-2 target nucleic acids are DETECTED. The SARS-CoV-2 RNA is generally detectable in upper and lower  respiratory specimens dur ing the acute phase of infection.  Positive  results are indicative of active infection with SARS-CoV-2.  Clinical  correlation with patient history and other diagnostic information is  necessary to determine patient infection status.  Positive results do  not rule out bacterial infection or co-infection with other viruses. If result is PRESUMPTIVE POSTIVE SARS-CoV-2 nucleic acids MAY BE PRESENT.   A presumptive positive result was obtained on the submitted specimen  and confirmed on repeat testing.  While 2019 novel coronavirus  (SARS-CoV-2) nucleic acids may be present in the submitted sample  additional confirmatory testing may be necessary for epidemiological  and / or clinical management purposes  to differentiate between  SARS-CoV-2 and other Sarbecovirus currently known to infect humans.  If clinically indicated additional testing with an alternate test  methodology [phone]) is advised. The SARS-CoV-2 RNA is generally  detectable in upper and lower respiratory sp ecimens during the acute  phase of infection. The  expected result is Negative. Fact Sheet for Patients:  StrictlyIdeas.no Fact Sheet for Healthcare Providers: BankingDealers.co.za This test is not yet approved or cleared by the [place] FDA and has been authorized for detection and/or diagnosis of SARS-CoV-2 by FDA under an Emergency Use Authorization (EUA).  This EUA will remain in effect (meaning this test can be used) for the duration of the COVID-19 declaration under Section 564(b)(1) of the Act, 21 U.S.C. section 360bbb-3(b)(1), unless the authorization is terminated or revoked sooner. Performed at [facility], [place] [address], [place], [place] [address]  Radiology Studies: No results found.      Scheduled Meds: . sodium chloride   Intravenous Once  . sodium chloride   Intravenous Once  . aspirin EC  81 mg Oral Daily  . atorvastatin  40 mg Oral QPC supper  . clopidogrel  75 mg Oral Daily  . dorzolamide-timolol  1 drop Both Eyes BID  . doxazosin  2 mg Oral QHS  . enoxaparin (LOVENOX) injection  40 mg Subcutaneous Q24H  . famotidine  20 mg Oral BID  . finasteride  5 mg Oral QPM  . insulin aspart  0-9 Units Subcutaneous TID WC  . levothyroxine  50 mcg Oral Q0600  . pyridOXINE  100 mg Oral Daily  . sodium chloride flush  3 mL Intravenous Q12H  . tamsulosin  0.4 mg Oral BID  . vitamin C  500 mg Oral Daily  . vitamin E  400 Units Oral Daily   Continuous Infusions: . sodium chloride          LOS: 5 days    Time spent: 35 minutes    Johannes Everage, MD [facility] Pager [phone]  If 7PM-7AM, please contact night-coverage [email or web address] Password [facility] 08/26/2018, 3:42 PM

## 2018-08-26 NOTE — Consult Note (Signed)
   [facility] CM Inpatient Consult   08/26/2018  [name] 07/09/1927 [id number]    Patient screened for high risk score for unplanned readmission score 22% and/or for hospitalizations to check if potential [facility] services are needed.    Review of patient's medical record reveals patient is from History and Physical 08/20/2018 :  [name] is a 85 y.o. male with history of peripheral vascular disease who has recently underwent right lower extremity intervention for peripheral vascular disease presents to the ER because of worsening pain and redness of the right lower extremity.  Patient has been having a gangrenous toe which has been progressing getting worse over the last 1 month.  It has been progressing involving the whole toe now and progressing to the other part of the right foot.  Patient also noticed increasing redness involving the foot which has progressed to distal right leg.  Over the last 2 weeks patient is not able ambulate because of the changes seen.   Also reviewed Palliative Care consult.  Primary Care Provider is Dr. [name], [facility]  Plan: Follow up with inpatient [facility] team on disposition needs.   Please place a [facility] Care Management consult as appropriate and for questions contact:   Natividad Brood, RN BSN [facility] Liaison  [phone] business mobile phone Toll free office [phone]  Fax number: [phone] [email or web address]

## 2018-08-26 NOTE — Care Management Important Message (Signed)
Important Message  Patient Details  Name: ZOHAIB HEENEY MRN: [id number] Date of Birth: 1927-06-20   Medicare Important Message Given:  Yes     Shelda Altes 08/26/2018, 2:49 PM

## 2018-08-26 NOTE — Progress Notes (Signed)
Clarified with md on call [facility] regarding blood transfusion order & she said to just check the labs in am first & then they will decide if to transfuse or not.

## 2018-08-26 NOTE — Progress Notes (Addendum)
Progress Note  Patient Name: [name] Date of Encounter: 08/26/2018  Primary Cardiologist: Pixie Casino, MD   Subjective   Appears a bit somnolent but responds to questions. No pain.   Inpatient Medications    Scheduled Meds: . sodium chloride   Intravenous Once  . sodium chloride   Intravenous Once  . aspirin EC  81 mg Oral Daily  . atorvastatin  40 mg Oral QPC supper  . clopidogrel  75 mg Oral Daily  . dorzolamide-timolol  1 drop Both Eyes BID  . doxazosin  2 mg Oral QHS  . enoxaparin (LOVENOX) injection  40 mg Subcutaneous Q24H  . famotidine  20 mg Oral BID  . finasteride  5 mg Oral QPM  . insulin aspart  0-9 Units Subcutaneous TID WC  . levothyroxine  50 mcg Oral Q0600  . pyridOXINE  100 mg Oral Daily  . sodium chloride flush  3 mL Intravenous Q12H  . tamsulosin  0.4 mg Oral BID  . vitamin C  500 mg Oral Daily  . vitamin E  400 Units Oral Daily   Continuous Infusions: . sodium chloride     PRN Meds: sodium chloride, acetaminophen **OR** acetaminophen, calcium carbonate, docusate sodium, ondansetron **OR** ondansetron (ZOFRAN) IV, oxyCODONE-acetaminophen, sodium chloride flush   Vital Signs    Vitals:   08/25/18 2000 08/25/18 2042 08/25/18 2240 08/26/18 0444  BP:  126/60 128/64 (!) 143/71  Pulse: (!) 58 (!) 53  (!) 56  Resp: 14 14  12   Temp:  98 F (36.7 C)  97.8 F (36.6 C)  TempSrc:  Oral  Axillary  SpO2: 94% 96%  97%  Weight:    116.1 kg  Height:        Intake/Output Summary (Last 24 hours) at 08/26/2018 0819 Last data filed at 08/26/2018 0445 Gross per 24 hour  Intake 465.5 ml  Output 1000 ml  Net -534.5 ml   Last 3 Weights 08/26/2018 08/23/2018 08/21/2018  Weight (lbs) 256 lb 253 lb 222 lb 14.2 oz  Weight (kg) 116.121 kg 114.76 kg 101.1 kg      Telemetry    Sinus bradycardia, mid 50s - Personally Reviewed  ECG    No new EKGs to review - Personally Reviewed  Physical Exam   GEN: elderly WM in No acute distress.   Neck: No JVD  Cardiac: RRR, 2/6 SM along LSB and apex Respiratory: Clear to auscultation bilaterally. GI: Soft, nontender, non-distended  MS: No edema; gangrenous Rt great toe  Neuro:  Nonfocal  Psych: Normal affect   Labs    High Sensitivity Troponin:   Recent Labs  Lab 08/24/18 0214  TROPONINIHS 26*      Cardiac EnzymesNo results for input(s): TROPONINI in the last 168 hours. No results for input(s): TROPIPOC in the last 168 hours.   Chemistry Recent Labs  Lab 08/20/18 1257 08/21/18 0742  08/24/18 0214 08/25/18 0139 08/25/18 0717 08/26/18 0509  NA 124* 126*   < > 123* 125* 126*  --   K 3.7 3.6   < > 4.2 4.1 5.0  --   CL 88* 93*   < > 92* 93* 94*  --   CO2 24 23   < > 21* 23 19*  --   GLUCOSE 157* 147*   < > 168* 223* 183*  --   BUN 21 19   < > 15 16 17   --   CREATININE 1.41* 1.19   < > 1.20 1.26* 1.23  1.18  CALCIUM 8.3* 7.9*   < > 8.1* 8.0* 8.4*  --   PROT 6.2* 5.3*  --   --   --   --   --   ALBUMIN 2.3* 2.0*  --   --   --   --   --   AST 117* 120*  --   --   --   --   --   ALT 139* 132*  --   --   --   --   --   ALKPHOS 82 76  --   --   --   --   --   BILITOT 0.7 0.8  --   --   --   --   --   GFRNONAA 44* 54*   < > 53* 50* 52* 54*  GFRAA 51* >60   < > >60 58* 60* >60  ANIONGAP 12 10   < > 10 9 13   --    < > = values in this interval not displayed.     Hematology Recent Labs  Lab 08/24/18 0214 08/25/18 0139 08/26/18 0509  WBC 13.9* 13.7* 19.1*  RBC 2.34* 2.66* 2.89*  HGB 7.3* 8.3* 8.9*  HCT 21.6* 24.6* 26.6*  MCV 92.3 92.5 92.0  MCH 31.2 31.2 30.8  MCHC 33.8 33.7 33.5  RDW 13.9 14.7 14.9  PLT 384 395 368    BNPNo results for input(s): BNP, PROBNP in the last 168 hours.   DDimer No results for input(s): DDIMER in the last 168 hours.   Radiology    No results found.  Cardiac Studies   Procedures - LE Angiogram 08/25/18   Findings:  Right common femoral artery: Minor irregularities. Right profunda femoral artery: Minor irregularities Right superficial  femoral artery:  Mild diffuse calcified disease throughout its course. Right popliteal artery: Heavily calcified with patent atherectomy site with 40% stenosis. Right tibial peroneal trunk: Occluded Right anterior tibial artery: Medium length occlusion proximally with reconstitution via collaterals in the midsegment.  This gives collaterals distally to the peroneal artery.  LOWER EXTREMITY ANGIOGRAPHY  Conclusion  Successful complex revascularization of anterior tibial artery with balloon angioplasty via the antegrade and retrograde fashion.  Recommendations: The patient is already on dual antiplatelet therapy. There is good inline flow all the way to the toes and hopefully the patient will be able to heal a transmetatarsal amputation after this procedure.     Patient Profile     85 y.o. male w/ h/o HTN, HLD, DM, PVD s/p left LE stenting in 2012 and OSA who presented w/ critical limb ischemia and gangrenous right great toe.   Assessment & Plan   1. PVD w/ Critical Limb Ischemia and Rt Great Toe Gangrene: He underwent recent successful atherectomy and balloon angioplasty of the right popliteal artery by Dr. Gwenlyn Found. He had known severe below the knee disease with occluded tibial vessels. Had repeat LE angiogram yesterday w/ Dr. Fletcher Anon and underwent successful complex revascularization of anterior tibial artery with balloon angioplasty. Per Dr. Fletcher Anon, post revascularization there appeared to be good inline flow all the way to the toes and hopefully the patient will be able to heal a transmetatarsal amputation after this procedure. Continue DAPT w/ ASA and Plavix + statin therapy.    For questions or updates, please contact [place] Please consult [email or web address] for contact info under      Signed, Lyda Jester, PA-C  08/26/2018, 8:19 AM    Patient seen and examined. Agree with assessment  and plan. Sleepy but answers to questions. HR  58 - 60. No JVD, no wheezing; 2/6 SEM aortic  area and LSB, BS +, gangrenous R great toe and part oof 2nd toe. S/P PV PCI yesterday. OK from cardiac standpoint to dc   [name], MD, [facility] 08/26/2018 9:30 AM

## 2018-08-27 ENCOUNTER — Inpatient Hospital Stay (HOSPITAL_COMMUNITY): Payer: Medicare Other

## 2018-08-27 DIAGNOSIS — Z7189 Other specified counseling: Secondary | ICD-10-CM

## 2018-08-27 DIAGNOSIS — I350 Nonrheumatic aortic (valve) stenosis: Secondary | ICD-10-CM

## 2018-08-27 LAB — CBC
HCT: 25.2 % — ABNORMAL LOW (ref 39.0–52.0)
HCT: 27.2 % — ABNORMAL LOW (ref 39.0–52.0)
Hemoglobin: 8.7 g/dL — ABNORMAL LOW (ref 13.0–17.0)
Hemoglobin: 8.8 g/dL — ABNORMAL LOW (ref 13.0–17.0)
MCH: 31.1 pg (ref 26.0–34.0)
MCH: 30.6 pg (ref 26.0–34.0)
MCHC: 34.5 g/dL (ref 30.0–36.0)
MCHC: 32.4 g/dL (ref 30.0–36.0)
MCV: 90 fL (ref 80.0–100.0)
MCV: 94.4 fL (ref 80.0–100.0)
Platelets: 319 10*3/uL (ref 150–400)
Platelets: 391 10*3/uL (ref 150–400)
RBC: 2.8 MIL/uL — ABNORMAL LOW (ref 4.22–5.81)
RBC: 2.88 MIL/uL — ABNORMAL LOW (ref 4.22–5.81)
RDW: 15 % (ref 11.5–15.5)
RDW: 15.2 % (ref 11.5–15.5)
WBC: 13.1 10*3/uL — ABNORMAL HIGH (ref 4.0–10.5)
WBC: 11.2 10*3/uL — ABNORMAL HIGH (ref 4.0–10.5)
nRBC: 0 % (ref 0.0–0.2)
nRBC: 0 % (ref 0.0–0.2)

## 2018-08-27 LAB — BASIC METABOLIC PANEL
Anion gap: 9 (ref 5–15)
BUN: 21 mg/dL (ref 8–23)
CO2: 23 mmol/L (ref 22–32)
Calcium: 8.1 mg/dL — ABNORMAL LOW (ref 8.9–10.3)
Chloride: 94 mmol/L — ABNORMAL LOW (ref 98–111)
Creatinine, Ser: 1.18 mg/dL (ref 0.61–1.24)
GFR calc Af Amer: >60 mL/min (ref >60)
GFR calc non Af Amer: 54 mL/min — ABNORMAL LOW (ref >60)
Glucose, Bld: 212 mg/dL — ABNORMAL HIGH (ref 70–99)
Potassium: 4 mmol/L (ref 3.5–5.1)
Sodium: 126 mmol/L — ABNORMAL LOW (ref 135–145)

## 2018-08-27 LAB — ECHOCARDIOGRAM COMPLETE
Height: 71.5 in
Weight: 3984 oz

## 2018-08-27 LAB — CREATININE, SERUM
Creatinine, Ser: 1.27 mg/dL — ABNORMAL HIGH (ref 0.61–1.24)
GFR calc Af Amer: 58 mL/min — ABNORMAL LOW (ref >60)
GFR calc non Af Amer: 50 mL/min — ABNORMAL LOW (ref >60)

## 2018-08-27 LAB — GLUCOSE, CAPILLARY
Glucose-Capillary: 186 mg/dL — ABNORMAL HIGH (ref 70–99)
Glucose-Capillary: 186 mg/dL — ABNORMAL HIGH (ref 70–99)
Glucose-Capillary: 267 mg/dL — ABNORMAL HIGH (ref 70–99)
Glucose-Capillary: 248 mg/dL — ABNORMAL HIGH (ref 70–99)

## 2018-08-27 MED ORDER — CEFAZOLIN SODIUM-DEXTROSE 2-4 GM/100ML-% IV SOLN
2.0000 g | INTRAVENOUS | Status: AC
  Administered 2018-08-28: 2 g via INTRAVENOUS
  Filled 2018-08-27: qty 100

## 2018-08-27 MED ORDER — CHLORHEXIDINE GLUCONATE 4 % EX LIQD
60.0000 mL | Freq: Once | CUTANEOUS | Status: AC
  Administered 2018-08-28: 4 via TOPICAL
  Filled 2018-08-27: qty 60

## 2018-08-27 NOTE — Telephone Encounter (Signed)
Pt had lower extremity angiography with Dr. Fletcher Anon 08/25/2018

## 2018-08-27 NOTE — Progress Notes (Addendum)
Patient ID: [name], male   DOB: 10-Mar-1927, 85 y.o.   MRN: [id number] Patient is status post endovascular revascularization to the right lower extremity.  Patient has progressive dry gangrenous changes to the right forefoot and has progressive dry gangrenous changes to the right heel with a hard black eschar on the heel that is 5 cm in diameter.  The dorsal foot ischemic changes extend to the midfoot.  With patient's gangrenous changes to the heel and gangrenous changes to the midfoot I do not feel patient has any foot salvage options.  With his recent revascularization I feel he should be able to heal a transtibial amputation well.  We will plan for transtibial amputation tomorrow Friday.  I have spoken with the patient's son and daughter over the phone have reviewed his medical condition the improvement of the circulation below the knee and they both agreed to proceed with a transtibial amputation.  Patient will need discharge to skilled nursing postoperatively.

## 2018-08-27 NOTE — Progress Notes (Signed)
PROGRESS NOTE    [name]  OZH:[id number]  DOB: 06-03-1927  DOA: 08/20/2018 PCP: [name], MD  Brief Narrative:  85 year old male with history of hypertension, hyperlipidemia, peripheral vascular disease, underwent stent placement to left SFA in 2012.  Patient developed infection after toenail was clipped in April 2020 requiring multiple rounds of antibiotics also worsening of the lower extremity edema.  Ultrasound showed ABI of 0.43 on the right with 0 vessel runoff.  Underwent peripheral angiography on 07/27/2018 by Dr. Alvester Chou revealed mid to distal left SFA stent patent, high-grade calcified disease under 0 vessel runoff on the left as well as on the right with a 99% calcified lesions in the distal right SFA/above-knee popliteal artery.  Underwent angioplasty followed by drug-coated balloon angioplasty to the distal right SFA.  Scented to emergency department on 08/20/2018 with worsening of the pain over the last 1 month with a discoloration of the foot.  Patient was initiated on broad-spectrum antibiotics in the emergency department.  Patient's initial lab work-up showed sodium of 124, creatinine of 1.4.  Patient underwent right lower extremity angiography with angioplasty on 08/25/2018.  Postprocedure patient was noted to have a hemoglobin of 7.3, received 2 units of packed RBC.  ##Right lower extremity cellulitis with dry gangrenous toes -Continue with cefepime, clindamycin -Continue with heparin drip -Continue with Plavix -Orthopedic surgery is on board, plan to do transtibial amputation on 08/28/2018 considering patient's extensive gangrenous discoloration extending into the foot.   ##Peripheral vascular disease -S/p PTCA in 2012 -Underwent angiogram, angioplasty on 07/27/2018 by Dr. Alvester Chou, has been compliant with medications -Admitted with gangrenous toes -Underwent angiogram on 08/25/2018 with angioplasty of anterior tibial artery with balloon angioplasty, post to revascularization  appeared to be good inline flow all the way to the toes. -Continue with DAPT with aspirin, Plavix and statin.  ##Anemia acute on chronic blood loss -Hemoglobin trended down to 7.3 -Received 2 units of packed RBC on 08/25/2011 -Keep the patient on IV iron  ##Encephalopathy metabolic/toxic -Patient is back from the procedure -Closely follow-up  ##Hypertension -Continue with home medications  ##Hypothyroidism -Continue with Synthroid  ##Acute on chronic kidney disease stage III -Closely follow-up with the BMP after the contrast given  ##Aortic valve stenosis -Patient has moderate stenosis  ##Diabetes mellitus type 2 -Follow-up on sliding scale insulin   DVT prophylaxis: Anticoagulation as above Code Status: Full code Family / Patient Communication: Patient's son is at bedside Disposition Plan: TBD. Will likely need SNF rehab    Subjective: Patient is awake.  States that not feeling well after hearing patient is going to undergo transtibial amputation.    Objective: Vitals:   08/26/18 1259 08/26/18 2024 08/27/18 0400 08/27/18 0435  BP: (!) 147/63 (!) 131/57 119/80   Pulse: (!) 59 (!) 59 (!) 57 (!) 58  Resp:  14 17 13   Temp: 97.8 F (36.6 C) 97.8 F (36.6 C)  97.7 F (36.5 C)  TempSrc: Oral Oral  Oral  SpO2: 99% 98% 97% 96%  Weight:    112.9 kg  Height:        Intake/Output Summary (Last 24 hours) at 08/27/2018 1402 Last data filed at 08/27/2018 1000 Gross per 24 hour  Intake 360 ml  Output 2050 ml  Net -1690 ml   Filed Weights   08/23/18 0634 08/26/18 0444 08/27/18 0435  Weight: 114.8 kg 116.1 kg 112.9 kg    Physical Examination:  General exam: Appears calm and comfortable  Respiratory system: Clear to auscultation. Respiratory effort normal.  Cardiovascular system: S1 & S2 heard, RRR. No JVD, murmurs, rubs, gallops or clicks. No pedal edema. Gastrointestinal system: Abdomen is nondistended, soft and nontender. No organomegaly or masses felt. Normal  bowel sounds heard. Central nervous system: Alert and oriented. No focal neurological deficits. Extremities: Symmetric 5 x 5 power. Skin: Gangrenous changes to the right foot with surrounding cellulitis as shown below.  Also has right heel wound Psychiatry: Judgement and insight appear normal. Mood & affect appropriate.          Data Reviewed: I have personally reviewed following labs and imaging studies  CBC: Recent Labs  Lab 08/21/18 0742  08/23/18 0222 08/24/18 0214 08/25/18 0139 08/26/18 0509 08/27/18 0641  WBC 11.9*   < > 13.9* 13.9* 13.7* 19.1* 13.1*  NEUTROABS 8.8*  --   --  10.3*  --   --   --   HGB 7.7*   < > 8.0* 7.3* 8.3* 8.9* 8.7*  HCT 22.7*   < > 23.7* 21.6* 24.6* 26.6* 25.2*  MCV 90.8   < > 91.5 92.3 92.5 92.0 90.0  PLT 350   < > 397 384 395 368 319   < > = values in this interval not displayed.   Basic Metabolic Panel: Recent Labs  Lab 08/21/18 0742  08/23/18 0944 08/24/18 0214 08/25/18 0139 08/25/18 0717 08/26/18 0509 08/27/18 0641  NA 126*  --  127* 123* 125* 126*  --   --   K 3.6  --  3.8 4.2 4.1 5.0  --   --   CL 93*  --  94* 92* 93* 94*  --   --   CO2 23  --  23 21* 23 19*  --   --   GLUCOSE 147*  --  171* 168* 223* 183*  --   --   BUN 19  --  16 15 16 17   --   --   CREATININE 1.19   < > 1.26* 1.20 1.26* 1.23 1.18 1.27*  CALCIUM 7.9*  --  8.2* 8.1* 8.0* 8.4*  --   --    < > = values in this interval not displayed.   GFR: Estimated Creatinine Clearance: 50.8 mL/min (A) (by C-G formula based on SCr of 1.27 mg/dL (H)). Liver Function Tests: Recent Labs  Lab 08/21/18 0742  AST 120*  ALT 132*  ALKPHOS 76  BILITOT 0.8  PROT 5.3*  ALBUMIN 2.0*   No results for input(s): LIPASE, AMYLASE in the last 168 hours. No results for input(s): AMMONIA in the last 168 hours. Coagulation Profile: No results for input(s): INR, PROTIME in the last 168 hours. Cardiac Enzymes: No results for input(s): CKTOTAL, CKMB, CKMBINDEX, TROPONINI in the last  168 hours. BNP (last 3 results) No results for input(s): PROBNP in the last 8760 hours. HbA1C: No results for input(s): HGBA1C in the last 72 hours. CBG: Recent Labs  Lab 08/26/18 1111 08/26/18 1624 08/26/18 2101 08/27/18 0755 08/27/18 1126  GLUCAP 261* 248* 223* 186* 186*   Lipid Profile: No results for input(s): CHOL, HDL, LDLCALC, TRIG, CHOLHDL, LDLDIRECT in the last 72 hours. Thyroid Function Tests: No results for input(s): TSH, T4TOTAL, FREET4, T3FREE, THYROIDAB in the last 72 hours. Anemia Panel: No results for input(s): VITAMINB12, FOLATE, FERRITIN, TIBC, IRON, RETICCTPCT in the last 72 hours. Sepsis Labs: Recent Labs  Lab 08/20/18 1735  LATICACIDVEN 1.8    Recent Results (from the past 240 hour(s))  Culture, blood (routine x 2)     Status: None  Collection Time: 08/20/18  5:35 PM   Specimen: BLOOD  Result Value Ref Range Status   Specimen Description BLOOD RIGHT ANTECUBITAL  Final   Special Requests   Final    BOTTLES DRAWN AEROBIC AND ANAEROBIC Blood Culture adequate volume   Culture   Final    NO GROWTH 5 DAYS Performed at [facility], [address] [place], [place] [address]    Report Status 08/25/2018 FINAL  Final  Culture, blood (routine x 2)     Status: None   Collection Time: 08/20/18  6:01 PM   Specimen: BLOOD  Result Value Ref Range Status   Specimen Description BLOOD RIGHT ANTECUBITAL  Final   Special Requests   Final    BOTTLES DRAWN AEROBIC AND ANAEROBIC Blood Culture adequate volume   Culture   Final    NO GROWTH 5 DAYS Performed at [facility], [place] [address] [place], [place] [address]    Report Status 08/25/2018 FINAL  Final  SARS Coronavirus 2 [facility] order, Performed in [facility] hospital lab) Nasopharyngeal Urine, Clean Catch     Status: None   Collection Time: 08/20/18  8:17 PM   Specimen: Urine, Clean Catch; Nasopharyngeal  Result Value Ref Range Status   SARS Coronavirus 2 NEGATIVE NEGATIVE Final    Comment:  (NOTE) If result is NEGATIVE SARS-CoV-2 target nucleic acids are NOT DETECTED. The SARS-CoV-2 RNA is generally detectable in upper and lower  respiratory specimens during the acute phase of infection. The lowest  concentration of SARS-CoV-2 viral copies this assay can detect is 250  copies / mL. A negative result does not preclude SARS-CoV-2 infection  and should not be used as the sole basis for treatment or other  patient management decisions.  A negative result may occur with  improper specimen collection / handling, submission of specimen other  than nasopharyngeal swab, presence of viral mutation(s) within the  areas targeted by this assay, and inadequate number of viral copies  (<250 copies / mL). A negative result must be combined with clinical  observations, patient history, and epidemiological information. If result is POSITIVE SARS-CoV-2 target nucleic acids are DETECTED. The SARS-CoV-2 RNA is generally detectable in upper and lower  respiratory specimens dur ing the acute phase of infection.  Positive  results are indicative of active infection with SARS-CoV-2.  Clinical  correlation with patient history and other diagnostic information is  necessary to determine patient infection status.  Positive results do  not rule out bacterial infection or co-infection with other viruses. If result is PRESUMPTIVE POSTIVE SARS-CoV-2 nucleic acids MAY BE PRESENT.   A presumptive positive result was obtained on the submitted specimen  and confirmed on repeat testing.  While 2019 novel coronavirus  (SARS-CoV-2) nucleic acids may be present in the submitted sample  additional confirmatory testing may be necessary for epidemiological  and / or clinical management purposes  to differentiate between  SARS-CoV-2 and other Sarbecovirus currently known to infect humans.  If clinically indicated additional testing with an alternate test  methodology [phone]) is advised. The SARS-CoV-2 RNA is  generally  detectable in upper and lower respiratory sp ecimens during the acute  phase of infection. The expected result is Negative. Fact Sheet for Patients:  StrictlyIdeas.no Fact Sheet for Healthcare Providers: BankingDealers.co.za This test is not yet approved or cleared by the [place] FDA and has been authorized for detection and/or diagnosis of SARS-CoV-2 by FDA under an Emergency Use Authorization (EUA).  This EUA  will remain in effect (meaning this test can be used) for the duration of the COVID-19 declaration under Section 564(b)(1) of the Act, 21 U.S.C. section 360bbb-3(b)(1), unless the authorization is terminated or revoked sooner. Performed at [facility], [place] [address], [place], [place] [address]       Radiology Studies: No results found.      Scheduled Meds:  sodium chloride   Intravenous Once   sodium chloride   Intravenous Once   aspirin EC  81 mg Oral Daily   atorvastatin  40 mg Oral QPC supper   clopidogrel  75 mg Oral Daily   dorzolamide-timolol  1 drop Both Eyes BID   doxazosin  2 mg Oral QHS   enoxaparin (LOVENOX) injection  40 mg Subcutaneous Q24H   famotidine  20 mg Oral BID   finasteride  5 mg Oral QPM   insulin aspart  0-9 Units Subcutaneous TID WC   levothyroxine  50 mcg Oral Q0600   pyridOXINE  100 mg Oral Daily   sodium chloride flush  3 mL Intravenous Q12H   tamsulosin  0.4 mg Oral BID   vitamin C  500 mg Oral Daily   vitamin E  400 Units Oral Daily   Continuous Infusions:  sodium chloride          LOS: 6 days    Time spent: 35 minutes    Vahan Wadsworth, MD [facility] Pager [phone]  If 7PM-7AM, please contact night-coverage [email or web address] Password [email or web address] 08/27/2018, 2:02 PM

## 2018-08-27 NOTE — Progress Notes (Addendum)
Palliative: [name] is sleeping quietly in bed.  He wakes easily making and somewhat keeping eye contact.  He appears elderly and frail.  [name] is alert and oriented, appropriate.  His son [name] is at bedside.  I ask if [name] has pain.  He states that he does have a little pain at times.  I encouraged him to ask for as needed pain medicine.  As I say this, [name] interrupts stating his father cannot have any pain medicine, it confuses him.  I share that [name] can receive a small dose, we do not want him to be in pain.  We talked about tomorrow's amputation.  [name] is somewhat tearful but accepting.  He had said previously that he would rather have amputation than lose his life.  We talked about having pain after amputation, but then he will be headed toward healing.  We talked about the likelihood that [name] will need rehab after surgery.  At this point family is accepting the likely need for rehab and states that they [facility].  I share that case manager/social worker will continue to work on disposition.  We talked about healthcare power of attorney and CODE STATUS.  [name] has both of these documents in his paper chart.  His healthcare power of attorney is listed as his daughter [name] is primary, son [name] is secondary.  [name] shares that his daughter makes healthcare choices if he cannot.  We talked about CODE STATUS.  I asked [name] if we were to come into the room and find that his heart had naturally stopped, would he want [place] to press on his chest to try and restart his heart he shakes his head no.  At this point son [name] states that we would do CPR, "if I have anything to say about it".  I share with [name] that [name] is allowed to decide for himself.  [name] talks about his father's healthcare power of attorney, advanced directives.  I shared that that document is important, but is more important to hear  what matters to [name] right now.  [name] hesitantly states that if he needed the ventilator support he would not want it for a long time.  [name] advance directive document is a standard document that states that he would not want his life prolonged if he had an incurable or irreversible condition or advanced dementia etc.  Due to his advanced age and comorbidities [name] and his family would benefit from detailed CODE STATUS discussions.  It is clear that [name] does not wish to have these discussions today.  As [name] is scheduled for surgery 8/14, and will need time to recover, PMT will follow-up on Sunday.  [name] states that his sister (primary healthcare power of attorney), [name], will be present on Sunday.  Conference with bedside nursing staff and social worker/case management related to patient condition, needs, disposition.  PMT to follow-up on Sunday.  Plan: Tearful but accepting of right BKA 8/14 a.m.  Agreeable to rehab afterwards, choices at this time would be [place], [facility] [place]. Mr. Gilardi initially tells me that he would not want CPR, but then states that if he had to have the ventilator, he would not want it for extended period of time.  PMT to continue CODE STATUS discussions  50 minutes, extended time Quinn Axe, NP Palliative Medicine Team  [phone]  Greater than 50% of this time was spent counseling and coordinating care related to the above assessment and plan.

## 2018-08-27 NOTE — H&P (View-Only) (Signed)
Patient ID: [name], male   DOB: [birth year], 85 y.o.   MRN: [id number] Patient is status post endovascular revascularization to the right lower extremity.  Patient has progressive dry gangrenous changes to the right forefoot and has progressive dry gangrenous changes to the right heel with a hard black eschar on the heel that is 5 cm in diameter.  The dorsal foot ischemic changes extend to the midfoot.  With patient's gangrenous changes to the heel and gangrenous changes to the midfoot I do not feel patient has any foot salvage options.  With his recent revascularization I feel he should be able to heal a transtibial amputation well.  We will plan for transtibial amputation tomorrow Friday.  I have spoken with the patient's son and daughter over the phone have reviewed his medical condition the improvement of the circulation below the knee and they both agreed to proceed with a transtibial amputation.  Patient will need discharge to skilled nursing postoperatively.

## 2018-08-27 NOTE — Progress Notes (Signed)
Progress Note  Patient Name: [name] Date of Encounter: 08/27/2018  Primary Cardiologist: Pixie Casino, MD   Subjective   More alert today. Pt concerned about transtibial rather than just foot planned amputation. No chest pain. No dyspnea.  Inpatient Medications    Scheduled Meds: . sodium chloride   Intravenous Once  . sodium chloride   Intravenous Once  . aspirin EC  81 mg Oral Daily  . atorvastatin  40 mg Oral QPC supper  . clopidogrel  75 mg Oral Daily  . dorzolamide-timolol  1 drop Both Eyes BID  . doxazosin  2 mg Oral QHS  . enoxaparin (LOVENOX) injection  40 mg Subcutaneous Q24H  . famotidine  20 mg Oral BID  . finasteride  5 mg Oral QPM  . insulin aspart  0-9 Units Subcutaneous TID WC  . levothyroxine  50 mcg Oral Q0600  . pyridOXINE  100 mg Oral Daily  . sodium chloride flush  3 mL Intravenous Q12H  . tamsulosin  0.4 mg Oral BID  . vitamin C  500 mg Oral Daily  . vitamin E  400 Units Oral Daily   Continuous Infusions: . sodium chloride     PRN Meds: sodium chloride, acetaminophen **OR** acetaminophen, calcium carbonate, docusate sodium, ondansetron **OR** ondansetron (ZOFRAN) IV, oxyCODONE-acetaminophen, sodium chloride flush   Vital Signs    Vitals:   08/26/18 1259 08/26/18 2024 08/27/18 0400 08/27/18 0435  BP: (!) 147/63 (!) 131/57 119/80   Pulse: (!) 59 (!) 59 (!) 57 (!) 58  Resp:  14 17 13   Temp: 97.8 F (36.6 C) 97.8 F (36.6 C)  97.7 F (36.5 C)  TempSrc: Oral Oral  Oral  SpO2: 99% 98% 97% 96%  Weight:    112.9 kg  Height:        Intake/Output Summary (Last 24 hours) at 08/27/2018 0917 Last data filed at 08/27/2018 0600 Gross per 24 hour  Intake 180 ml  Output 2050 ml  Net -1870 ml   Last 3 Weights 08/27/2018 08/26/2018 08/23/2018  Weight (lbs) 249 lb 256 lb 253 lb  Weight (kg) 112.946 kg 116.121 kg 114.76 kg      Telemetry    Sinus bradycardia, mid 66s - Personally Reviewed  ECG    No new EKGs to review - Personally  Reviewed  Physical Exam   GEN: elderly WM  No acute distress  Neck: No JVD  Cardiac: RRR in 60s, 2/6 SM c/w aortic stenosis along LSB and apex Respiratory: Clear to auscultation bilaterally. GI: Soft, nontender, non-distended  MS: No edema; gangrenous Rt forefoot and heal Neuro:  Nonfocal  Psych: Normal affect   Labs    High Sensitivity Troponin:   Recent Labs  Lab 08/24/18 0214  TROPONINIHS 26*      Cardiac EnzymesNo results for input(s): TROPONINI in the last 168 hours. No results for input(s): TROPIPOC in the last 168 hours.   Chemistry Recent Labs  Lab 08/20/18 1257 08/21/18 2423  08/24/18 0214 08/25/18 0139 08/25/18 0717 08/26/18 0509 08/27/18 0641  NA 124* 126*   < > 123* 125* 126*  --   --   K 3.7 3.6   < > 4.2 4.1 5.0  --   --   CL 88* 93*   < > 92* 93* 94*  --   --   CO2 24 23   < > 21* 23 19*  --   --   GLUCOSE 157* 147*   < > 168* 223*  183*  --   --   BUN 21 19   < > 15 16 17   --   --   CREATININE 1.41* 1.19   < > 1.20 1.26* 1.23 1.18 1.27*  CALCIUM 8.3* 7.9*   < > 8.1* 8.0* 8.4*  --   --   PROT 6.2* 5.3*  --   --   --   --   --   --   ALBUMIN 2.3* 2.0*  --   --   --   --   --   --   AST 117* 120*  --   --   --   --   --   --   ALT 139* 132*  --   --   --   --   --   --   ALKPHOS 82 76  --   --   --   --   --   --   BILITOT 0.7 0.8  --   --   --   --   --   --   GFRNONAA 44* 54*   < > 53* 50* 52* 54* 50*  GFRAA 51* >60   < > >60 58* 60* >60 58*  ANIONGAP 12 10   < > 10 9 13   --   --    < > = values in this interval not displayed.     Hematology Recent Labs  Lab 08/25/18 0139 08/26/18 0509 08/27/18 0641  WBC 13.7* 19.1* 13.1*  RBC 2.66* 2.89* 2.80*  HGB 8.3* 8.9* 8.7*  HCT 24.6* 26.6* 25.2*  MCV 92.5 92.0 90.0  MCH 31.2 30.8 31.1  MCHC 33.7 33.5 34.5  RDW 14.7 14.9 15.0  PLT 395 368 319    BNPNo results for input(s): BNP, PROBNP in the last 168 hours.   DDimer No results for input(s): DDIMER in the last 168 hours.   Radiology     No results found.  Cardiac Studies   Procedures - LE Angiogram 08/25/18   Findings:  Right common femoral artery: Minor irregularities. Right profunda femoral artery: Minor irregularities Right superficial femoral artery:  Mild diffuse calcified disease throughout its course. Right popliteal artery: Heavily calcified with patent atherectomy site with 40% stenosis. Right tibial peroneal trunk: Occluded Right anterior tibial artery: Medium length occlusion proximally with reconstitution via collaterals in the midsegment.  This gives collaterals distally to the peroneal artery.  LOWER EXTREMITY ANGIOGRAPHY  Conclusion  Successful complex revascularization of anterior tibial artery with balloon angioplasty via the antegrade and retrograde fashion.  Recommendations: The patient is already on dual antiplatelet therapy. There is good inline flow all the way to the toes and hopefully the patient will be able to heal a transmetatarsal amputation after this procedure.     Patient Profile     85 y.o. male w/ h/o HTN, HLD, DM, PVD s/p left LE stenting in 2012 and OSA who presented w/ critical limb ischemia and gangrenous right great toe.   Assessment & Plan   1. PVD w/ Critical Limb Ischemia and Rt Great Toe Gangrene: He underwent recent successful atherectomy and balloon angioplasty of the right popliteal artery by Dr. Gwenlyn Found. He had known severe below the knee disease with occluded tibial vessels. Had repeat LE angiogram yesterday w/ Dr. Fletcher Anon and underwent successful complex revascularization of anterior tibial artery with balloon angioplasty. Per Dr. Fletcher Anon, post revascularization there appeared to be good inline flow all the way to the toes and hopefully the  patient will be able to heal a transmetatarsal amputation after this procedure. Continue DAPT w/ ASA and Plavix + statin therapy. Pt seen by DrDuda with plan for transtibial amputation tomorrow.   2. Aortic stenosis:  Last echo EF 60 -  65%; mean AV gradient 25; peak 45; AVA 1.4 cm^2. Will repeat echo today prior to planned surgery tomorrow.   For questions or updates, please contact [place] Please consult [email or web address] for contact info under      Signed, Shelva Majestic, MD  08/27/2018, 9:17 AM

## 2018-08-28 ENCOUNTER — Encounter (HOSPITAL_COMMUNITY): Payer: Self-pay | Admitting: Certified Registered"

## 2018-08-28 ENCOUNTER — Inpatient Hospital Stay (HOSPITAL_COMMUNITY): Payer: Medicare Other | Admitting: Certified Registered"

## 2018-08-28 ENCOUNTER — Encounter (HOSPITAL_COMMUNITY)
Admission: EM | Disposition: A | Discharge status: Rehabilitation Facility | Payer: Medicare Other | Source: Home / Self Care | Attending: Internal Medicine

## 2018-08-28 DIAGNOSIS — I96 Gangrene, not elsewhere classified: Secondary | ICD-10-CM

## 2018-08-28 HISTORY — PX: AMPUTATION: SHX166

## 2018-08-28 LAB — CREATININE, SERUM
Creatinine, Ser: 1.12 mg/dL (ref 0.61–1.24)
GFR calc Af Amer: >60 mL/min (ref >60)
GFR calc non Af Amer: 58 mL/min — ABNORMAL LOW (ref >60)

## 2018-08-28 LAB — GLUCOSE, CAPILLARY
Glucose-Capillary: 195 mg/dL — ABNORMAL HIGH (ref 70–99)
Glucose-Capillary: 181 mg/dL — ABNORMAL HIGH (ref 70–99)
Glucose-Capillary: 194 mg/dL — ABNORMAL HIGH (ref 70–99)
Glucose-Capillary: 190 mg/dL — ABNORMAL HIGH (ref 70–99)
Glucose-Capillary: 186 mg/dL — ABNORMAL HIGH (ref 70–99)
Glucose-Capillary: 149 mg/dL — ABNORMAL HIGH (ref 70–99)

## 2018-08-28 LAB — SURGICAL PCR SCREEN
MRSA, PCR: POSITIVE — AB
Staphylococcus aureus: POSITIVE — AB

## 2018-08-28 SURGERY — AMPUTATION BELOW KNEE
Anesthesia: General | Laterality: Right

## 2018-08-28 MED ORDER — LIDOCAINE 2% (20 MG/ML) 5 ML SYRINGE
INTRAMUSCULAR | Status: DC | PRN
  Administered 2018-08-28: 100 mg via INTRAVENOUS

## 2018-08-28 MED ORDER — ONDANSETRON HCL 4 MG PO TABS: 4.0000 mg | ORAL_TABLET | Freq: Four times a day (QID) | ORAL | Status: DC | PRN

## 2018-08-28 MED ORDER — DOCUSATE SODIUM 100 MG PO CAPS
100.0000 mg | ORAL_CAPSULE | Freq: Two times a day (BID) | ORAL | Status: DC
  Administered 2018-08-28 – 2018-09-07 (×21): 100 mg via ORAL
  Filled 2018-08-28 (×19): qty 1

## 2018-08-28 MED ORDER — ONDANSETRON HCL 4 MG/2ML IJ SOLN: 4.0000 mg | Freq: Four times a day (QID) | INTRAMUSCULAR | Status: DC | PRN

## 2018-08-28 MED ORDER — CEFAZOLIN SODIUM-DEXTROSE 1-4 GM/50ML-% IV SOLN
1.0000 g | Freq: Four times a day (QID) | INTRAVENOUS | Status: AC
  Administered 2018-08-28 – 2018-08-29 (×3): 1 g via INTRAVENOUS
  Filled 2018-08-28 (×3): qty 50

## 2018-08-28 MED ORDER — FENTANYL CITRATE (PF) 250 MCG/5ML IJ SOLN
INTRAMUSCULAR | Status: DC | PRN
  Administered 2018-08-28: 50 ug via INTRAVENOUS

## 2018-08-28 MED ORDER — SODIUM CHLORIDE 0.9 % IV SOLN: INTRAVENOUS | Status: DC

## 2018-08-28 MED ORDER — FENTANYL CITRATE (PF) 250 MCG/5ML IJ SOLN
INTRAMUSCULAR | Status: AC
  Filled 2018-08-28: qty 5

## 2018-08-28 MED ORDER — OXYCODONE HCL 5 MG PO TABS
5.0000 mg | ORAL_TABLET | ORAL | Status: DC | PRN
  Administered 2018-08-28: 10 mg via ORAL
  Filled 2018-08-28: qty 2

## 2018-08-28 MED ORDER — CHLORHEXIDINE GLUCONATE CLOTH 2 % EX PADS
6.0000 | MEDICATED_PAD | Freq: Every day | CUTANEOUS | Status: AC
  Administered 2018-08-28 – 2018-09-01 (×4): 6 via TOPICAL

## 2018-08-28 MED ORDER — LIDOCAINE 2% (20 MG/ML) 5 ML SYRINGE
INTRAMUSCULAR | Status: AC
  Filled 2018-08-28: qty 5

## 2018-08-28 MED ORDER — ACETAMINOPHEN 325 MG PO TABS
325.0000 mg | ORAL_TABLET | Freq: Four times a day (QID) | ORAL | Status: DC | PRN
  Administered 2018-08-30 – 2018-08-31 (×5): 650 mg via ORAL
  Administered 2018-09-01: 325 mg via ORAL
  Administered 2018-09-02 – 2018-09-03 (×2): 650 mg via ORAL
  Filled 2018-08-28 (×10): qty 2

## 2018-08-28 MED ORDER — ONDANSETRON HCL 4 MG/2ML IJ SOLN
INTRAMUSCULAR | Status: AC
  Filled 2018-08-28: qty 2

## 2018-08-28 MED ORDER — STERILE WATER FOR IRRIGATION IR SOLN
Status: DC | PRN
  Administered 2018-08-28: 1000 mL

## 2018-08-28 MED ORDER — POLYETHYLENE GLYCOL 3350 17 G PO PACK
17.0000 g | PACK | Freq: Every day | ORAL | Status: DC | PRN
  Filled 2018-08-28: qty 1

## 2018-08-28 MED ORDER — MEPERIDINE HCL 25 MG/ML IJ SOLN: 6.2500 mg | INTRAMUSCULAR | Status: DC | PRN

## 2018-08-28 MED ORDER — ARTIFICIAL TEARS OPHTHALMIC OINT
TOPICAL_OINTMENT | OPHTHALMIC | Status: AC
  Filled 2018-08-28: qty 3.5

## 2018-08-28 MED ORDER — CLINDAMYCIN PHOSPHATE 900 MG/50ML IV SOLN
900.0000 mg | INTRAVENOUS | Status: AC
  Administered 2018-08-28: 900 mg via INTRAVENOUS
  Filled 2018-08-28: qty 50

## 2018-08-28 MED ORDER — 0.9 % SODIUM CHLORIDE (POUR BTL) OPTIME
TOPICAL | Status: DC | PRN
  Administered 2018-08-28: 1000 mL

## 2018-08-28 MED ORDER — MUPIROCIN 2 % EX OINT
1.0000 "application " | TOPICAL_OINTMENT | Freq: Two times a day (BID) | CUTANEOUS | Status: AC
  Administered 2018-08-28 – 2018-08-31 (×6): 1 via NASAL
  Filled 2018-08-28: qty 22

## 2018-08-28 MED ORDER — METOCLOPRAMIDE HCL 5 MG/ML IJ SOLN: 10.0000 mg | Freq: Once | INTRAMUSCULAR | Status: DC | PRN

## 2018-08-28 MED ORDER — METOCLOPRAMIDE HCL 5 MG/ML IJ SOLN: 5.0000 mg | Freq: Three times a day (TID) | INTRAMUSCULAR | Status: DC | PRN

## 2018-08-28 MED ORDER — FENTANYL CITRATE (PF) 100 MCG/2ML IJ SOLN
25.0000 ug | INTRAMUSCULAR | Status: DC | PRN
  Administered 2018-08-28 (×2): 25 ug via INTRAVENOUS

## 2018-08-28 MED ORDER — SODIUM CHLORIDE 0.9 % IV SOLN
125.0000 mg | Freq: Once | INTRAVENOUS | Status: AC
  Administered 2018-08-28: 125 mg via INTRAVENOUS
  Filled 2018-08-28: qty 10

## 2018-08-28 MED ORDER — METHOCARBAMOL 1000 MG/10ML IJ SOLN
500.0000 mg | Freq: Four times a day (QID) | INTRAVENOUS | Status: DC | PRN
  Filled 2018-08-28: qty 5

## 2018-08-28 MED ORDER — OXYCODONE HCL 5 MG PO TABS
10.0000 mg | ORAL_TABLET | ORAL | Status: DC | PRN
  Administered 2018-09-05: 15 mg via ORAL
  Filled 2018-08-28: qty 3

## 2018-08-28 MED ORDER — LACTATED RINGERS IV SOLN
INTRAVENOUS | Status: DC | PRN
  Administered 2018-08-28: 07:00:00 via INTRAVENOUS

## 2018-08-28 MED ORDER — HYDROMORPHONE HCL 1 MG/ML IJ SOLN
0.5000 mg | INTRAMUSCULAR | Status: DC | PRN
  Administered 2018-08-28: 18:00:00 1 mg via INTRAVENOUS
  Administered 2018-09-02: 0.5 mg via INTRAVENOUS
  Filled 2018-08-28 (×2): qty 1

## 2018-08-28 MED ORDER — PROPOFOL 10 MG/ML IV BOLUS
INTRAVENOUS | Status: AC
  Filled 2018-08-28: qty 20

## 2018-08-28 MED ORDER — FENTANYL CITRATE (PF) 100 MCG/2ML IJ SOLN
INTRAMUSCULAR | Status: AC
  Filled 2018-08-28: qty 2

## 2018-08-28 MED ORDER — METOCLOPRAMIDE HCL 5 MG PO TABS: 5.0000 mg | ORAL_TABLET | Freq: Three times a day (TID) | ORAL | Status: DC | PRN

## 2018-08-28 MED ORDER — METHOCARBAMOL 500 MG PO TABS
500.0000 mg | ORAL_TABLET | Freq: Four times a day (QID) | ORAL | Status: DC | PRN
  Administered 2018-08-29 – 2018-08-30 (×4): 500 mg via ORAL
  Filled 2018-08-28 (×4): qty 1

## 2018-08-28 MED ORDER — ONDANSETRON HCL 4 MG/2ML IJ SOLN
INTRAMUSCULAR | Status: DC | PRN
  Administered 2018-08-28: 4 mg via INTRAVENOUS

## 2018-08-28 MED ORDER — PROPOFOL 10 MG/ML IV BOLUS
INTRAVENOUS | Status: DC | PRN
  Administered 2018-08-28: 140 mg via INTRAVENOUS

## 2018-08-28 MED ORDER — BISACODYL 10 MG RE SUPP: 10.0000 mg | Freq: Every day | RECTAL | Status: DC | PRN

## 2018-08-28 MED ORDER — CHLORHEXIDINE GLUCONATE CLOTH 2 % EX PADS
6.0000 | MEDICATED_PAD | Freq: Every day | CUTANEOUS | Status: AC
  Administered 2018-08-30: 6 via TOPICAL

## 2018-08-28 MED ORDER — MAGNESIUM CITRATE PO SOLN: 1.0000 | Freq: Once | ORAL | Status: DC | PRN

## 2018-08-28 SURGICAL SUPPLY — 39 items
BLADE SAW RECIP 87.9 MT (BLADE) ×2 IMPLANT
BLADE SURG 21 STRL SS (BLADE) ×2 IMPLANT
BNDG COHESIVE 6X5 TAN STRL LF (GAUZE/BANDAGES/DRESSINGS) IMPLANT
CANISTER WOUND CARE 500ML ATS (WOUND CARE) ×2 IMPLANT
COVER SURGICAL LIGHT HANDLE (MISCELLANEOUS) ×2 IMPLANT
COVER WAND RF STERILE (DRAPES) IMPLANT
CUFF TOURN SGL QUICK 34 (TOURNIQUET CUFF) ×1
CUFF TRNQT CYL 34X4.125X (TOURNIQUET CUFF) ×1 IMPLANT
DRAPE INCISE IOBAN 66X45 STRL (DRAPES) ×2 IMPLANT
DRAPE U-SHAPE 47X51 STRL (DRAPES) ×2 IMPLANT
DRESSING PREVENA PLUS CUSTOM (GAUZE/BANDAGES/DRESSINGS) ×1 IMPLANT
DRSG PREVENA PLUS CUSTOM (GAUZE/BANDAGES/DRESSINGS) ×2
DURAPREP 26ML APPLICATOR (WOUND CARE) ×2 IMPLANT
ELECT REM PT RETURN 9FT ADLT (ELECTROSURGICAL) ×2
ELECTRODE REM PT RTRN 9FT ADLT (ELECTROSURGICAL) ×1 IMPLANT
GLOVE BIOGEL PI IND STRL 9 (GLOVE) ×1 IMPLANT
GLOVE BIOGEL PI INDICATOR 9 (GLOVE) ×1
GLOVE SURG ORTHO 9.0 STRL STRW (GLOVE) ×2 IMPLANT
GOWN STRL REUS W/ TWL XL LVL3 (GOWN DISPOSABLE) ×2 IMPLANT
GOWN STRL REUS W/TWL XL LVL3 (GOWN DISPOSABLE) ×2
KIT BASIN OR (CUSTOM PROCEDURE TRAY) ×2 IMPLANT
KIT TURNOVER KIT B (KITS) ×2 IMPLANT
MANIFOLD NEPTUNE II (INSTRUMENTS) ×2 IMPLANT
NS IRRIG 1000ML POUR BTL (IV SOLUTION) ×2 IMPLANT
PACK ORTHO EXTREMITY (CUSTOM PROCEDURE TRAY) ×2 IMPLANT
PAD ARMBOARD 7.5X6 YLW CONV (MISCELLANEOUS) ×2 IMPLANT
PREVENA RESTOR ARTHOFORM 46X30 (CANNISTER) ×2 IMPLANT
SPONGE LAP 18X18 RF (DISPOSABLE) IMPLANT
STAPLER VISISTAT 35W (STAPLE) IMPLANT
STOCKINETTE IMPERVIOUS LG (DRAPES) ×2 IMPLANT
SUT ETHILON 2 0 PSLX (SUTURE) IMPLANT
SUT SILK 2 0 (SUTURE) ×1
SUT SILK 2-0 18XBRD TIE 12 (SUTURE) ×1 IMPLANT
SUT VIC AB 1 CTX 27 (SUTURE) ×4 IMPLANT
SUT VIC AB 1 CTX 36 (SUTURE) ×1
SUT VIC AB 1 CTX36XBRD ANBCTR (SUTURE) IMPLANT
TOWEL GREEN STERILE (TOWEL DISPOSABLE) ×2 IMPLANT
TUBE CONNECTING 12X1/4 (SUCTIONS) ×2 IMPLANT
YANKAUER SUCT BULB TIP NO VENT (SUCTIONS) ×2 IMPLANT

## 2018-08-28 NOTE — Anesthesia Procedure Notes (Signed)
Procedure Name: LMA Insertion Date/Time: 08/28/2018 7:32 AM Performed by: Barrington Ellison, CRNA Pre-anesthesia Checklist: Patient identified, Emergency Drugs available, Suction available and Patient being monitored Patient Re-evaluated:Patient Re-evaluated prior to induction Oxygen Delivery Method: Circle System Utilized Preoxygenation: Pre-oxygenation with 100% oxygen Induction Type: IV induction Ventilation: Mask ventilation without difficulty LMA: LMA inserted LMA Size: 5.0 Number of attempts: 1 Placement Confirmation: positive ETCO2 Tube secured with: Tape Dental Injury: Teeth and Oropharynx as per pre-operative assessment and Injury to lip

## 2018-08-28 NOTE — Op Note (Signed)
   Date of Surgery: 08/28/2018  INDICATIONS: Mr. [name] is a 85 y.o.-year-old male who has severe peripheral vascular disease.  He is status post endovascular procedure however has dry gangrenous changes of the heel forefoot and midfoot.  Has improved circulation into the calf and should be able to heal a trans-tibial amputation.[name]  PREOPERATIVE DIAGNOSIS: Dry gangrene of the right foot Wagoner grade 5.  POSTOPERATIVE DIAGNOSIS: Same.  PROCEDURE: Transtibial amputation Application of Prevena wound VAC  SURGEON: Sharol Given, M.D.  ANESTHESIA:  general  IV FLUIDS AND URINE: See anesthesia.  ESTIMATED BLOOD LOSS: Minimal mL.  COMPLICATIONS: None.  DESCRIPTION OF PROCEDURE: The patient was brought to the operating room and underwent a general anesthetic. After adequate levels of anesthesia were obtained patient's lower extremity was prepped using DuraPrep draped into a sterile field. A timeout was called. The foot was draped out of the sterile field with impervious stockinette. A transverse incision was made 11 cm distal to the tibial tubercle. This curved proximally and a large posterior flap was created. The tibia was transected 1 cm proximal to the skin incision. The fibula was transected just proximal to the tibial incision. The tibia was beveled anteriorly. A large posterior flap was created. The sciatic nerve was pulled cut and allowed to retract. The vascular bundles were suture ligated with 2-0 silk. The deep and superficial fascial layers were closed using #1 Vicryl. The skin was closed using staples and 2-0 nylon. The wound was covered with a Prevena wound VAC. There was a good suction fit. A prosthetic shrinker was applied. Patient was extubated taken to the PACU in stable condition.   DISCHARGE PLANNING:  Antibiotic duration: 23 hours  Weightbearing: Nonweightbearing on the right.  Pain medication: Opioid pathway ordered  Dressing care/ Wound VAC: Continue wound VAC for 1 week.  [facility]  to provide stump protector stump shrinker and prosthetic  Discharge to: Skilled nursing facility  Follow-up: In the office 1 week post operative.  [name], MD [place] 8:22 AM

## 2018-08-28 NOTE — NC FL2 (Signed)
[place] LEVEL OF CARE SCREENING TOOL     IDENTIFICATION  Patient Name: [name] Birthdate: 03/26/1927 Sex: male Admission Date (Current Location): 08/20/2018  [facility] and [place] Number:  Herbalist and Address:  [facility], [place] [address] [place], [place] [address]      Provider Number: [id number]  Attending Physician Name and Address:  Monica Becton, MD  Relative Name and Phone Number:  [name]; daughter, [phone]    Current Level of Care: Hospital Recommended Level of Care: [place] Prior Approval Number:    Date Approved/Denied:   PASRR Number: [id number]  Discharge Plan: SNF    Current Diagnoses: Patient Active Problem List   Diagnosis Date Noted  . Gangrene of right foot ([place])   . DNR (do not resuscitate) discussion   . Goals of care, counseling/discussion   . Palliative care by specialist   . 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 Severe protein-calorie malnutrition ([place])   . Ischemic foot 08/20/2018  . Critical lower limb ischemia 08/03/2018  . Type 2 diabetes mellitus with foot ulcer, without long-term current use of insulin ([place])   . Gastroesophageal reflux disease   . Cellulitis of great toe of right foot 05/29/2018  . Atrial fibrillation, chronic   . Chest pain 07/25/2017  . PAF (paroxysmal atrial fibrillation) ([place]) 07/25/2017  . BPH (benign prostatic hyperplasia) 07/25/2017  . Shortness of breath 05/11/2017  . Hypothyroidism 05/11/2017  . Chronic diastolic CHF (congestive heart failure) ([place]) 05/11/2017  . Aortic valve stenosis 06/12/2016  . RBBB 06/12/2016  . PAD (peripheral artery disease) ([place]) 08/04/2012  . Essential hypertension 08/04/2012  . Hyperlipidemia 08/04/2012  . Dizziness 08/04/2012    Orientation RESPIRATION BLADDER Height & Weight     Self, Time, Place  Normal Continent, External catheter Weight: 243 lb (110.2  kg) Height:  5' 11.5" (181.6 cm)  BEHAVIORAL SYMPTOMS/MOOD NEUROLOGICAL BOWEL NUTRITION STATUS      Continent Diet(see discharge summary)  AMBULATORY STATUS COMMUNICATION OF NEEDS Skin   Extensive Assist Verbally Surgical wounds(right amputation)  Prevena wound vac @ [place] Level of Assistance  Bathing, Feeding, Dressing Bathing Assistance: Maximum assistance Feeding assistance: Limited assistance Dressing Assistance: Maximum assistance     Functional Limitations Info  Speech, Hearing, Sight Sight Info: Adequate Hearing Info: Adequate Speech Info: Adequate    SPECIAL CARE FACTORS FREQUENCY  OT (By licensed OT), PT (By licensed PT)     PT Frequency: 5x week OT Frequency: 5x week            Contractures Contractures Info: Not present    Additional Factors Info  Code Status, Allergies, Insulin Sliding Scale Code Status Info: Full Code Allergies Info: Iodinated Diagnostic Agents, Iodine-131, Iohexol   Insulin Sliding Scale Info: insulin aspart (novoLOG) injection 0-9 Units 3x daily with meals       Current Medications (08/28/2018):  This is the current hospital active medication list Current Facility-Administered Medications  Medication Dose Route Frequency Provider Last Rate Last Dose  . 0.9 %  sodium chloride infusion (Manually program via Guardrails IV Fluids)   Intravenous Once [name], MD      . 0.9 %  sodium chloride infusion (Manually program via Guardrails IV Fluids)   Intravenous Once [name], MD      .  0.9 %  sodium chloride infusion  250 mL Intravenous PRN [name], MD      . 0.9 %  sodium chloride infusion   Intravenous Continuous [name], MD      . Derrill Memo ON 08/29/2018] acetaminophen (TYLENOL) tablet 325-650 mg  325-650 mg Oral Q6H PRN [name], MD      . aspirin EC tablet 81 mg  81 mg Oral Daily [name], MD   81 mg at 08/28/18 1020  . atorvastatin (LIPITOR) tablet 40 mg   40 mg Oral QPC supper [name], MD   40 mg at 08/27/18 1710  . bisacodyl (DULCOLAX) suppository 10 mg  10 mg Rectal Daily PRN [name], MD      . calcium carbonate (TUMS - dosed in mg elemental calcium) chewable tablet 200 mg of elemental calcium  1 tablet Oral PRN [name], MD   200 mg of elemental calcium at 08/24/18 0042  . ceFAZolin (ANCEF) IVPB 1 g/50 mL premix  1 g Intravenous Q6H [name], MD      . Chlorhexidine Gluconate Cloth 2 % PADS 6 each  6 each Topical Q0600 [name], MD   6 each at 08/28/18 0230  . Chlorhexidine Gluconate Cloth 2 % PADS 6 each  6 each Topical Daily [name], MD      . clopidogrel (PLAVIX) tablet 75 mg  75 mg Oral Daily [name], MD   75 mg at 08/28/18 1021  . docusate sodium (COLACE) capsule 100 mg  100 mg Oral BID PRN [name], MD   100 mg at 08/25/18 1737  . docusate sodium (COLACE) capsule 100 mg  100 mg Oral BID [name], MD   100 mg at 08/28/18 1403  . dorzolamide-timolol (COSOPT) 22.3-6.8 MG/ML ophthalmic solution 1 drop  1 drop Both Eyes BID [name], MD   1 drop at 08/28/18 1021  . doxazosin (CARDURA) tablet 2 mg  2 mg Oral QHS [name], MD   2 mg at 08/27/18 2217  . enoxaparin (LOVENOX) injection 40 mg  40 mg Subcutaneous Q24H [name], MD   40 mg at 08/27/18 0903  . famotidine (PEPCID) tablet 20 mg  20 mg Oral BID [name], MD   20 mg at 08/28/18 1022  . fentaNYL (SUBLIMAZE) 100 MCG/2ML injection           . ferric gluconate (NULECIT) 125 mg in sodium chloride 0.9 % 100 mL IVPB  125 mg Intravenous Once Monica Becton, MD      . finasteride (PROSCAR) tablet 5 mg  5 mg Oral QPM [name], MD   5 mg at 08/27/18 1710  . HYDROmorphone (DILAUDID) injection 0.5-1 mg  0.5-1 mg Intravenous Q4H PRN [name], MD      . insulin aspart (novoLOG) injection 0-9 Units  0-9 Units Subcutaneous TID WC [name], MD   2 Units at 08/28/18 1230  . levothyroxine (SYNTHROID) tablet  50 mcg  50 mcg Oral Q0600 [name], MD   50 mcg at 08/28/18 0416  . magnesium citrate solution 1 Bottle  1 Bottle Oral Once PRN [name], MD      . methocarbamol (ROBAXIN) tablet 500 mg  500 mg Oral Q6H PRN [name], MD       Or  . methocarbamol (ROBAXIN) 500 mg in dextrose 5 % 50  mL IVPB  500 mg Intravenous Q6H PRN [name], MD      . metoCLOPramide (REGLAN) tablet 5-10 mg  5-10 mg Oral Q8H PRN [name], MD       Or  . metoCLOPramide (REGLAN) injection 5-10 mg  5-10 mg Intravenous Q8H PRN [name], MD      . mupirocin ointment (BACTROBAN) 2 % 1 application  1 application Nasal BID [name], MD   1 application at 61/60/73 0230  . ondansetron (ZOFRAN) tablet 4 mg  4 mg Oral Q6H PRN [name], MD       Or  . ondansetron [facility]) injection 4 mg  4 mg Intravenous Q6H PRN [name], MD      . oxyCODONE (Oxy IR/ROXICODONE) immediate release tablet 10-15 mg  10-15 mg Oral Q4H PRN [name], MD      . oxyCODONE (Oxy IR/ROXICODONE) immediate release tablet 5-10 mg  5-10 mg Oral Q4H PRN [name], MD   10 mg at 08/28/18 1604  . oxyCODONE-acetaminophen (PERCOCET/ROXICET) 5-325 MG per tablet 1 tablet  1 tablet Oral Q8H PRN [name], MD   1 tablet at 08/28/18 1020  . polyethylene glycol (MIRALAX / GLYCOLAX) packet 17 g  17 g Oral Daily PRN [name], MD      . pyridOXINE (VITAMIN B-6) tablet 100 mg  100 mg Oral Daily [name], MD   100 mg at 08/28/18 1023  . sodium chloride flush (NS) 0.9 % injection 3 mL  3 mL Intravenous Q12H [name], MD   3 mL at 08/27/18 2217  . sodium chloride flush (NS) 0.9 % injection 3 mL  3 mL Intravenous PRN [name], MD      . tamsulosin [facility]) capsule 0.4 mg  0.4 mg Oral BID [name], MD   0.4 mg at 08/27/18 2217  . vitamin C (ASCORBIC ACID) tablet 500 mg  500 mg Oral Daily [name], MD   500 mg at 08/28/18 1020  . vitamin E capsule 400 Units  400 Units Oral Daily [name], MD   400 Units at 08/28/18 1024     Discharge Medications: Please see discharge summary for a list of discharge medications.  Relevant Imaging Results:  Relevant Lab Results:   Additional Information [id number] [place], [place]

## 2018-08-28 NOTE — Social Work (Signed)
CSW acknowledging consult for SNF placement. Will follow for therapy recommendations.   Porchia Sinkler, MSW, LCSWA  Clinical Social Work [phone]   

## 2018-08-28 NOTE — Progress Notes (Signed)
Patient's surgical PCR swab was positive for MRSA and staphylococcus aureus. Initiated positive standing, will continue to monitor patient.

## 2018-08-28 NOTE — Anesthesia Preprocedure Evaluation (Signed)
Anesthesia Evaluation  Patient identified by MRN, date of birth, ID band Patient awake    Reviewed: Allergy & Precautions, NPO status , Patient's Chart, lab work & pertinent test results  Airway Mallampati: II  TM Distance: >3 FB Neck ROM: Full    Dental no notable dental hx. (+) Upper Dentures, Lower Dentures   Pulmonary sleep apnea , former smoker,    Pulmonary exam normal breath sounds clear to auscultation       Cardiovascular hypertension, Pt. on medications + Peripheral Vascular Disease  negative cardio ROS Normal cardiovascular exam Rhythm:Regular Rate:Normal     Neuro/Psych negative neurological ROS  negative psych ROS   GI/Hepatic negative GI ROS, Neg liver ROS,   Endo/Other  diabetes  Renal/GU negative Renal ROS  negative genitourinary   Musculoskeletal negative musculoskeletal ROS (+)   Abdominal   Peds negative pediatric ROS (+)  Hematology negative hematology ROS (+)   Anesthesia Other Findings   Reproductive/Obstetrics negative OB ROS                             Anesthesia Physical Anesthesia Plan  ASA: III  Anesthesia Plan: General   Post-op Pain Management:    Induction: Intravenous  PONV Risk Score and Plan: 2 and Ondansetron and Treatment may vary due to age or medical condition  Airway Management Planned: LMA  Additional Equipment:   Intra-op Plan:   Post-operative Plan:   Informed Consent: I have reviewed the patients History and Physical, chart, labs and discussed the procedure including the risks, benefits and alternatives for the proposed anesthesia with the patient or authorized representative who has indicated his/her understanding and acceptance.     Dental advisory given  Plan Discussed with: CRNA  Anesthesia Plan Comments:         Anesthesia Quick Evaluation

## 2018-08-28 NOTE — Progress Notes (Signed)
Orthopedic Tech Progress Note Patient Details:  [name] Mar 12, 1927 [id number]  Patient ID: [name], male   DOB: 16-Jan-1927, 85 y.o.   MRN: [id number]   [place] Pink 08/28/2018, 11:42 AMCalled [facility] for right stump shrinnker and limb guard.

## 2018-08-28 NOTE — Transfer of Care (Signed)
Immediate Anesthesia Transfer of Care Note  Patient: [name]  Procedure(s) Performed: RIGHT BELOW KNEE AMPUTATION (Right )  Patient Location: PACU  Anesthesia Type:General  Level of Consciousness: awake  Airway & Oxygen Therapy: Patient Spontanous Breathing and Patient connected to face mask oxygen  Post-op Assessment: Report given to RN  Post vital signs: Reviewed and stable  Last Vitals:  Vitals Value Taken Time  BP    Temp    Pulse 63 08/28/18 0818  Resp    SpO2 92 % 08/28/18 0818  Vitals shown include unvalidated device data.  Last Pain:  Vitals:   08/28/18 0500  TempSrc: Oral  PainSc:       Patients Stated Pain Goal: 0 (81/44/39 2659)  Complications: No apparent anesthesia complications

## 2018-08-28 NOTE — Interval H&P Note (Signed)
History and Physical Interval Note:  08/28/2018 6:46 AM  [name]  has presented today for surgery, with the diagnosis of Gangrene Right Foot.  The various methods of treatment have been discussed with the patient and family. After consideration of risks, benefits and other options for treatment, the patient has consented to  Procedure(s): RIGHT BELOW KNEE AMPUTATION (Right) as a surgical intervention.  The patient's history has been reviewed, patient examined, no change in status, stable for surgery.  I have reviewed the patient's chart and labs.  Questions were answered to the patient's satisfaction.     [name]

## 2018-08-28 NOTE — Anesthesia Postprocedure Evaluation (Signed)
Anesthesia Post Note  Patient: [name]  Procedure(s) Performed: RIGHT BELOW KNEE AMPUTATION (Right )     Patient location during evaluation: PACU Anesthesia Type: General Level of consciousness: awake and alert Pain management: pain level controlled Vital Signs Assessment: post-procedure vital signs reviewed and stable Respiratory status: spontaneous breathing, nonlabored ventilation, respiratory function stable and patient connected to nasal cannula oxygen Cardiovascular status: blood pressure returned to baseline and stable Postop Assessment: no apparent nausea or vomiting Anesthetic complications: no    Last Vitals:  Vitals:   08/28/18 0910 08/28/18 0911  BP:    Pulse: 75 76  Resp: 16   Temp:  (!) 36.2 C  SpO2: 94% 94%    Last Pain:  Vitals:   08/28/18 0820  TempSrc:   PainSc: Asleep                 Montez Hageman

## 2018-08-28 NOTE — Progress Notes (Signed)
PROGRESS NOTE    [name]  QGB:[id number]  DOB: 1927-06-03  DOA: 08/20/2018 PCP: [name], MD  Brief Narrative:  85 year old male with history of hypertension, hyperlipidemia, peripheral vascular disease, underwent stent placement to left SFA in 2012.  Patient developed infection after toenail was clipped in April 2020 requiring multiple rounds of antibiotics also worsening of the lower extremity edema.  Ultrasound showed ABI of 0.43 on the right with 0 vessel runoff.  Underwent peripheral angiography on 07/27/2018 by Dr. Alvester Chou revealed mid to distal left SFA stent patent, high-grade calcified disease under 0 vessel runoff on the left as well as on the right with a 99% calcified lesions in the distal right SFA/above-knee popliteal artery.  Underwent angioplasty followed by drug-coated balloon angioplasty to the distal right SFA.  Scented to emergency department on 08/20/2018 with worsening of the pain over the last 1 month with a discoloration of the foot.  Patient was initiated on broad-spectrum antibiotics in the emergency department.  Patient's initial lab work-up showed sodium of 124, creatinine of 1.4.  Patient underwent right lower extremity angiography with angioplasty on 08/25/2018.  Postprocedure patient was noted to have a hemoglobin of 7.3, received 2 units of packed RBC.  ##Right lower extremity cellulitis with dry gangrenous toes -Continue with cefepime, clindamycin -Continue with heparin drip -Continue with Plavix -Orthopedic surgery is following, underwent transtibial amputation on 08/28/2018 considering patient's extensive gangrenous discoloration extending into the foot.   ##Peripheral vascular disease -S/p PTCA in 2012 -Underwent angiogram, angioplasty on 07/27/2018 by Dr. Alvester Chou, has been compliant with medications -Admitted with gangrenous toes -Underwent angiogram on 08/25/2018 with angioplasty of anterior tibial artery with balloon angioplasty, post to revascularization  appeared to be good inline flow all the way to the toes. -Continue with DAPT with aspirin, Plavix and statin.  ##Anemia acute on chronic blood loss -Hemoglobin trended down to 7.3 -Received 2 units of packed RBC on 08/25/2011 -Keep the patient on IV iron  ##Encephalopathy metabolic/toxic -Patient is back from the procedure -Closely follow-up  ##Hypertension -Continue with home medications  ##Hypothyroidism -Continue with Synthroid  ##Acute on chronic kidney disease stage III -Closely follow-up with the BMP after the contrast given  ##Aortic valve stenosis -Patient has moderate stenosis  ##Diabetes mellitus type 2 -Follow-up on sliding scale insulin   DVT prophylaxis: Anticoagulation as above Code Status: Full code Family / Patient Communication: Patient's son is at bedside Disposition Plan: TBD. Will likely need SNF rehab    Subjective: Patient is awake.  States that not feeling well after hearing patient is going to undergo transtibial amputation.    Objective: Vitals:   08/28/18 0900 08/28/18 0904 08/28/18 0910 08/28/18 0911  BP:  (!) 146/77    Pulse: 71 72 75 76  Resp: 12 11 16    Temp:    (!) 97.2 F (36.2 C)  TempSrc:      SpO2: 94% 96% 94% 94%  Weight:      Height:        Intake/Output Summary (Last 24 hours) at 08/28/2018 1503 Last data filed at 08/28/2018 0911 Gross per 24 hour  Intake 350 ml  Output 3025 ml  Net -2675 ml   Filed Weights   08/26/18 0444 08/27/18 0435 08/28/18 0500  Weight: 116.1 kg 112.9 kg 110.2 kg    Physical Examination:  General exam: Appears calm and comfortable  Respiratory system: Clear to auscultation. Respiratory effort normal. Cardiovascular system: S1 & S2 heard, RRR. No JVD, murmurs, rubs, gallops or clicks.  No pedal edema. Gastrointestinal system: Abdomen is nondistended, soft and nontender. No organomegaly or masses felt. Normal bowel sounds heard. Central nervous system: Alert and oriented. No focal neurological  deficits. Extremities: Symmetric 5 x 5 power. Skin: Gangrenous changes to the right foot with surrounding cellulitis as shown below.  Also has right heel wound Psychiatry: Judgement and insight appear normal. Mood & affect appropriate.          Data Reviewed: I have personally reviewed following labs and imaging studies  CBC: Recent Labs  Lab 08/24/18 0214 08/25/18 0139 08/26/18 0509 08/27/18 0641 08/27/18 1936  WBC 13.9* 13.7* 19.1* 13.1* 11.2*  NEUTROABS 10.3*  --   --   --   --   HGB 7.3* 8.3* 8.9* 8.7* 8.8*  HCT 21.6* 24.6* 26.6* 25.2* 27.2*  MCV 92.3 92.5 92.0 90.0 94.4  PLT 384 395 368 319 132   Basic Metabolic Panel: Recent Labs  Lab 08/23/18 0944 08/24/18 0214 08/25/18 0139 08/25/18 0717 08/26/18 0509 08/27/18 0641 08/27/18 1936 08/28/18 0408  NA 127* 123* 125* 126*  --   --  126*  --   K 3.8 4.2 4.1 5.0  --   --  4.0  --   CL 94* 92* 93* 94*  --   --  94*  --   CO2 23 21* 23 19*  --   --  23  --   GLUCOSE 171* 168* 223* 183*  --   --  212*  --   BUN 16 15 16 17   --   --  21  --   CREATININE 1.26* 1.20 1.26* 1.23 1.18 1.27* 1.18 1.12  CALCIUM 8.2* 8.1* 8.0* 8.4*  --   --  8.1*  --    GFR: Estimated Creatinine Clearance: 56.9 mL/min (by C-G formula based on SCr of 1.12 mg/dL). Liver Function Tests: No results for input(s): AST, ALT, ALKPHOS, BILITOT, PROT, ALBUMIN in the last 168 hours. No results for input(s): LIPASE, AMYLASE in the last 168 hours. No results for input(s): AMMONIA in the last 168 hours. Coagulation Profile: No results for input(s): INR, PROTIME in the last 168 hours. Cardiac Enzymes: No results for input(s): CKTOTAL, CKMB, CKMBINDEX, TROPONINI in the last 168 hours. BNP (last 3 results) No results for input(s): PROBNP in the last 8760 hours. HbA1C: No results for input(s): HGBA1C in the last 72 hours. CBG: Recent Labs  Lab 08/27/18 2128 08/28/18 0528 08/28/18 0615 08/28/18 0835 08/28/18 1130  GLUCAP 248* 195* 181* 194*  190*   Lipid Profile: No results for input(s): CHOL, HDL, LDLCALC, TRIG, CHOLHDL, LDLDIRECT in the last 72 hours. Thyroid Function Tests: No results for input(s): TSH, T4TOTAL, FREET4, T3FREE, THYROIDAB in the last 72 hours. Anemia Panel: No results for input(s): VITAMINB12, FOLATE, FERRITIN, TIBC, IRON, RETICCTPCT in the last 72 hours. Sepsis Labs: No results for input(s): PROCALCITON, LATICACIDVEN in the last 168 hours.  Recent Results (from the past 240 hour(s))  Culture, blood (routine x 2)     Status: None   Collection Time: 08/20/18  5:35 PM   Specimen: BLOOD  Result Value Ref Range Status   Specimen Description BLOOD RIGHT ANTECUBITAL  Final   Special Requests   Final    BOTTLES DRAWN AEROBIC AND ANAEROBIC Blood Culture adequate volume   Culture   Final    NO GROWTH 5 DAYS Performed at [facility], [address], [place], [place] [address]    Report Status 08/25/2018 FINAL  Final  Culture,  blood (routine x 2)     Status: None   Collection Time: 08/20/18  6:01 PM   Specimen: BLOOD  Result Value Ref Range Status   Specimen Description BLOOD RIGHT ANTECUBITAL  Final   Special Requests   Final    BOTTLES DRAWN AEROBIC AND ANAEROBIC Blood Culture adequate volume   Culture   Final    NO GROWTH 5 DAYS Performed at [facility], [address] [place], [place] [address]    Report Status 08/25/2018 FINAL  Final  SARS Coronavirus 2 [facility] order, Performed in [facility] hospital lab) Nasopharyngeal Urine, Clean Catch     Status: None   Collection Time: 08/20/18  8:17 PM   Specimen: Urine, Clean Catch; Nasopharyngeal  Result Value Ref Range Status   SARS Coronavirus 2 NEGATIVE NEGATIVE Final    Comment: (NOTE) If result is NEGATIVE SARS-CoV-2 target nucleic acids are NOT DETECTED. The SARS-CoV-2 RNA is generally detectable in upper and lower  respiratory specimens during the acute phase of infection. The lowest  concentration of SARS-CoV-2 viral copies this  assay can detect is 250  copies / mL. A negative result does not preclude SARS-CoV-2 infection  and should not be used as the sole basis for treatment or other  patient management decisions.  A negative result may occur with  improper specimen collection / handling, submission of specimen other  than nasopharyngeal swab, presence of viral mutation(s) within the  areas targeted by this assay, and inadequate number of viral copies  (<250 copies / mL). A negative result must be combined with clinical  observations, patient history, and epidemiological information. If result is POSITIVE SARS-CoV-2 target nucleic acids are DETECTED. The SARS-CoV-2 RNA is generally detectable in upper and lower  respiratory specimens dur ing the acute phase of infection.  Positive  results are indicative of active infection with SARS-CoV-2.  Clinical  correlation with patient history and other diagnostic information is  necessary to determine patient infection status.  Positive results do  not rule out bacterial infection or co-infection with other viruses. If result is PRESUMPTIVE POSTIVE SARS-CoV-2 nucleic acids MAY BE PRESENT.   A presumptive positive result was obtained on the submitted specimen  and confirmed on repeat testing.  While 2019 novel coronavirus  (SARS-CoV-2) nucleic acids may be present in the submitted sample  additional confirmatory testing may be necessary for epidemiological  and / or clinical management purposes  to differentiate between  SARS-CoV-2 and other Sarbecovirus currently known to infect humans.  If clinically indicated additional testing with an alternate test  methodology [phone]) is advised. The SARS-CoV-2 RNA is generally  detectable in upper and lower respiratory sp ecimens during the acute  phase of infection. The expected result is Negative. Fact Sheet for Patients:  StrictlyIdeas.no Fact Sheet for Healthcare Providers:  BankingDealers.co.za This test is not yet approved or cleared by the [place] FDA and has been authorized for detection and/or diagnosis of SARS-CoV-2 by FDA under an Emergency Use Authorization (EUA).  This EUA will remain in effect (meaning this test can be used) for the duration of the COVID-19 declaration under Section 564(b)(1) of the Act, 21 U.S.C. section 360bbb-3(b)(1), unless the authorization is terminated or revoked sooner. Performed at [facility], [place] [address], [place], [place] [address]   Surgical pcr screen     Status: Abnormal   Collection Time: 08/27/18 10:20 PM   Specimen: Nasal Mucosa; Nasal Swab  Result Value Ref Range Status  MRSA, PCR POSITIVE (A) NEGATIVE Final    Comment: RESULT CALLED TO, READ BACK BY AND VERIFIED WITH: Dorise Bullion RN 08/28/18 115 JDW    Staphylococcus aureus POSITIVE (A) NEGATIVE Final    Comment: (NOTE) The Xpert SA Assay (FDA approved for NASAL specimens in patients 24 years of age and older), is one component of a comprehensive surveillance program. It is not intended to diagnose infection nor to guide or monitor treatment. Performed at [facility], [place] [address] [place], [place] [address]       Radiology Studies: No results found.      Scheduled Meds: . sodium chloride   Intravenous Once  . sodium chloride   Intravenous Once  . aspirin EC  81 mg Oral Daily  . atorvastatin  40 mg Oral QPC supper  . Chlorhexidine Gluconate Cloth  6 each Topical Q0600  . Chlorhexidine Gluconate Cloth  6 each Topical Daily  . clopidogrel  75 mg Oral Daily  . docusate sodium  100 mg Oral BID  . dorzolamide-timolol  1 drop Both Eyes BID  . doxazosin  2 mg Oral QHS  . enoxaparin (LOVENOX) injection  40 mg Subcutaneous Q24H  . famotidine  20 mg Oral BID  . fentaNYL      . finasteride  5 mg Oral QPM  . insulin aspart  0-9 Units Subcutaneous TID WC  . levothyroxine  50 mcg Oral Q0600  . mupirocin  ointment  1 application Nasal BID  . pyridOXINE  100 mg Oral Daily  . sodium chloride flush  3 mL Intravenous Q12H  . tamsulosin  0.4 mg Oral BID  . vitamin C  500 mg Oral Daily  . vitamin E  400 Units Oral Daily   Continuous Infusions: . sodium chloride    . sodium chloride    .  ceFAZolin (ANCEF) IV    . methocarbamol (ROBAXIN) IV          LOS: 7 days    Time spent: 32 minutes    Joscelyn Hardrick, MD [facility] Pager [phone]  If 7PM-7AM, please contact night-coverage [email or web address] Password [email or web address] 08/28/2018, 3:03 PM

## 2018-08-29 ENCOUNTER — Encounter (HOSPITAL_COMMUNITY): Payer: Self-pay | Admitting: Orthopedic Surgery

## 2018-08-29 LAB — BPAM RBC
Blood Product Expiration Date: 202009022359
Unit Type and Rh: 5100

## 2018-08-29 LAB — GLUCOSE, CAPILLARY
Glucose-Capillary: 166 mg/dL — ABNORMAL HIGH (ref 70–99)
Glucose-Capillary: 284 mg/dL — ABNORMAL HIGH (ref 70–99)
Glucose-Capillary: 174 mg/dL — ABNORMAL HIGH (ref 70–99)
Glucose-Capillary: 231 mg/dL — ABNORMAL HIGH (ref 70–99)

## 2018-08-29 LAB — TYPE AND SCREEN
ABO/RH(D): O POS
Antibody Screen: NEGATIVE
Unit division: 0

## 2018-08-29 LAB — CREATININE, SERUM
Creatinine, Ser: 1.18 mg/dL (ref 0.61–1.24)
GFR calc Af Amer: >60 mL/min (ref >60)
GFR calc non Af Amer: 54 mL/min — ABNORMAL LOW (ref >60)

## 2018-08-29 NOTE — Progress Notes (Signed)
Rehab Admissions Coordinator Note:  Patient was screened by Cleatrice Burke for appropriateness for an Inpatient Acute Rehab Consult per PT recs.   At this time, we are recommending Inpatient Rehab consult. Please place order for consult.Cleatrice Burke RN MSN 08/29/2018, 2:32 PM  I can be reached at [phone].

## 2018-08-29 NOTE — Progress Notes (Signed)
Subjective: 1 Day Post-Op Procedure(s) (LRB): RIGHT BELOW KNEE AMPUTATION (Right) Patient reports pain as mild.    Objective: Vital signs in last 24 hours: Temp:  [97.2 F (36.2 C)-98.9 F (37.2 C)] 97.8 F (36.6 C) (08/15 0523) Pulse Rate:  [64-76] 64 (08/15 0523) Resp:  [11-16] 14 (08/15 0523) BP: (98-146)/(56-77) 130/60 (08/15 0523) SpO2:  [94 %-96 %] 95 % (08/15 0523) Weight:  [115.7 kg] 115.7 kg (08/15 0523)  Intake/Output from previous day: 08/14 0701 - 08/15 0700 In: 350 [I.V.:300] Out: 925 [Urine:825; Blood:100] Intake/Output this shift: No intake/output data recorded.  Recent Labs    08/27/18 0641 08/27/18 1936  HGB 8.7* 8.8*   Recent Labs    08/27/18 0641 08/27/18 1936  WBC 13.1* 11.2*  RBC 2.80* 2.88*  HCT 25.2* 27.2*  PLT 319 391   Recent Labs    08/27/18 1936 08/28/18 0408 08/29/18 0359  NA 126*  --   --   K 4.0  --   --   CL 94*  --   --   CO2 23  --   --   BUN 21  --   --   CREATININE 1.18 1.12 1.18  GLUCOSE 212*  --   --   CALCIUM 8.1*  --   --    No results for input(s): LABPT, INR in the last 72 hours.  VAC dressing intact over the right transtibial amputation and without drainage in canister. Limb protector and shrinker stocking in place  Assessment/Plan: 1 Day Post-Op Procedure(s) (LRB): RIGHT BELOW KNEE AMPUTATION (Right) Right transtibial amputation- POD #1- Continue VAC dressing, shrinker and limb protector.  Up with PT/OT Plan SNF placement  [facility], PA-C 08/29/2018, 8:55 AM  [facility] [phone]

## 2018-08-29 NOTE — Progress Notes (Signed)
[facility] will sign off.   Medication Recommendations:  None Other recommendations (labs, testing, etc):  None Follow up as an outpatient:  To be arranged

## 2018-08-29 NOTE — Evaluation (Signed)
Physical Therapy Evaluation Patient Details Name: [name] MRN: [id number] DOB: [birth year] Today's Date: 08/29/2018   History of Present Illness  [name] is a 85 y.o. male with history of PVD who has recently underwent R LE intervention for peripheral vascular disease presents to the ER because of worsening pain and redness of the right lower extremity.  Patient has been having a gangrenous toe which has been progressing getting worse over the last 1 month and has progressed to dorsal foot. Pt underwent R BKA on 8/14.  Clinical Impression  Pt admitted with above. Pt was indep, driving, and amb without AD up until 2 week before the surgery. Pt now maxA for mobility. Son present and discussed d/c recommendations extensively. I anticipate pt to progress well once his pain is under control and he isn't so sleepy from medication. Patients son is to discuss with his brother and sister if they can ultimately provide at 24/7 assist plan upon d/c for patient to attend CIR. If not patient will need ST-SNF placement to allow for longer rehab time to achieve safe mod I level of function. Acute PT to cont to follow.    Follow Up Recommendations CIR    Equipment Recommendations  (TBD)    Recommendations for Other Services Rehab consult     Precautions / Restrictions Precautions Precautions: Fall Precaution Comments: pt with R BKA and splint/shrinker from [place] or Orthoses: Other Brace Other Brace: splint and shrinker from hanger Restrictions Weight Bearing Restrictions: Yes RLE Weight Bearing: Non weight bearing      Mobility  Bed Mobility Overal bed mobility: Needs Assistance Bed Mobility: Supine to Sit;Sit to Supine     Supine to sit: Max assist;HOB elevated Sit to supine: Max assist   General bed mobility comments: pt initiated bringing LEs over, pt used PT and bed rail to pull trunk up to EOB, maxA with bed pad to scoot to EOB and modA to maintain midline  and upright at EOB, pt initially awake but then kept eyes closed due to sleepiness, when awake pt able to hold self up with bilat UE support, but then required maxA to maintain siting when fell asleep, able to sit EOB x 6 min  Transfers                 General transfer comment: deferred today due to lethargy  Ambulation/Gait             General Gait Details: unable at this time  Stairs            Wheelchair Mobility    Modified Rankin (Stroke Patients Only)       Balance Overall balance assessment: Needs assistance Sitting-balance support: Feet supported;Bilateral upper extremity supported Sitting balance-Leahy Scale: Poor Sitting balance - Comments: retropulsion due to fatigue Postural control: Posterior lean                                   Pertinent Vitals/Pain Pain Assessment: Faces Faces Pain Scale: Hurts even more Pain Location: R LE with ROM into knee extension Pain Descriptors / Indicators: Grimacing Pain Intervention(s): Premedicated before session    Home Living Family/patient expects to be discharged to:: Inpatient rehab Living Arrangements: Alone               Additional Comments: pt lived alone and was completely indep up until 2 weeks ago    Prior  Function Level of Independence: Independent         Comments: was driving, grocery shopping and amb without AD up until 2 weeks ago     Hand Dominance   Dominant Hand: Right    Extremity/Trunk Assessment   Upper Extremity Assessment Upper Extremity Assessment: Generalized weakness    Lower Extremity Assessment Lower Extremity Assessment: RLE deficits/detail;LLE deficits/detail RLE Deficits / Details: knee in splint but was able to initiate knee flexion AA when splint removed, able to intiated SLF with splint on LLE Deficits / Details: able to intiate knee to chest and ankle pumps    Cervical / Trunk Assessment Cervical / Trunk Assessment: Kyphotic   Communication   Communication: No difficulties  Cognition Arousal/Alertness: Lethargic(pt just received pain meds 30 min prior to PT entering room) Behavior During Therapy: [facility] for tasks assessed/performed Overall Cognitive Status: Impaired/Different from baseline(however most likely due to pain meds) Area of Impairment: Problem solving                             Problem Solving: Slow processing;Requires verbal cues;Requires tactile cues General Comments: pt with difficulty maintaining eyes open due to sleepiness from pain medicine      General Comments General comments (skin integrity, edema, etc.): R LE in shrinker and splint    Exercises Amputee Exercises Hip Flexion/Marching: AAROM;Right;10 reps   Assessment/Plan    PT Assessment Patient needs continued PT services  PT Problem List Decreased strength;Decreased range of motion;Decreased activity tolerance;Decreased balance;Decreased mobility;Decreased knowledge of use of DME;Pain       PT Treatment Interventions DME instruction;Gait training;Stair training;Functional mobility training;Therapeutic activities;Therapeutic exercise;Balance training;Neuromuscular re-education    PT Goals (Current goals can be found in the Care Plan section)  Acute Rehab PT Goals Patient Stated Goal: didn'tstate PT Goal Formulation: With patient/family Time For Goal Achievement: 09/12/18 Potential to Achieve Goals: Good    Frequency Min 3X/week   Barriers to discharge        Co-evaluation               AM-PAC PT "6 Clicks" Mobility  Outcome Measure Help needed turning from your back to your side while in a flat bed without using bedrails?: A Lot Help needed moving from lying on your back to sitting on the side of a flat bed without using bedrails?: A Lot Help needed moving to and from a bed to a chair (including a wheelchair)?: A Lot Help needed standing up from a chair using your arms (e.g., wheelchair or bedside  chair)?: Total Help needed to walk in hospital room?: Total Help needed climbing 3-5 steps with a railing? : Total 6 Click Score: 9    End of Session Equipment Utilized During Treatment: Other (comment)(R LE splint) Activity Tolerance: Patient tolerated treatment well Patient left: in bed;with call bell/phone within reach;with family/visitor present(in chair position to eat lunch) Nurse Communication: Mobility status PT Visit Diagnosis: Unsteadiness on feet (R26.81);Difficulty in walking, not elsewhere classified (R26.2)    Time: 1607-3710 PT Time Calculation (min) (ACUTE ONLY): 34 min   Charges:   PT Evaluation $PT Eval Moderate Complexity: 1 Mod PT Treatments $Therapeutic Activity: 8-22 mins        Kittie Plater, PT, DPT Acute Rehabilitation Services Pager #: [phone] Office #: [phone]   Berline Lopes 08/29/2018, 2:24 PM

## 2018-08-29 NOTE — Progress Notes (Signed)
PROGRESS NOTE    [name]  YSA:[id number]  DOB: [birth year]  DOA: 08/20/2018 PCP: [name], MD  Brief Narrative:  85 year old male with history of hypertension, hyperlipidemia, peripheral vascular disease, underwent stent placement to left SFA in 2012.  Patient developed infection after toenail was clipped in April 2020 requiring multiple rounds of antibiotics also worsening of the lower extremity edema.  Ultrasound showed ABI of 0.43 on the right with 0 vessel runoff.  Underwent peripheral angiography on 07/27/2018 by Dr. Alvester Chou revealed mid to distal left SFA stent patent, high-grade calcified disease under 0 vessel runoff on the left as well as on the right with a 99% calcified lesions in the distal right SFA/above-knee popliteal artery.  Underwent angioplasty followed by drug-coated balloon angioplasty to the distal right SFA.  Scented to emergency department on 08/20/2018 with worsening of the pain over the last 1 month with a discoloration of the foot.  Patient was initiated on broad-spectrum antibiotics in the emergency department.  Patient's initial lab work-up showed sodium of 124, creatinine of 1.4.  Patient underwent right lower extremity angiography with angioplasty on 08/25/2018.  Postprocedure patient was noted to have a hemoglobin of 7.3, received 2 units of packed RBC.  ##Right lower extremity cellulitis with dry gangrenous toes -Continue with cefepime, clindamycin -Continue with heparin drip -Continue with Plavix -Orthopedic surgery is following, underwent transtibial amputation on 08/28/2018 considering patient's extensive gangrenous discoloration extending into the foot. -Continue with wound VAC   ##Peripheral vascular disease -S/p PTCA in 2012 -Underwent angiogram, angioplasty on 07/27/2018 by Dr. Alvester Chou, has been compliant with medications -Admitted with gangrenous toes -Underwent angiogram on 08/25/2018 with angioplasty of anterior tibial artery with balloon angioplasty,  post to revascularization appeared to be good inline flow all the way to the toes. -Continue with DAPT with aspirin, Plavix and statin.  ##Anemia acute on chronic blood loss -Hemoglobin trended down to 7.3 -Received 2 units of packed RBC on 08/25/2011 -Keep the patient on IV iron  ##Encephalopathy metabolic/toxic -Resolved  ##Hypertension -Continue with home medications  ##Hypothyroidism -Continue with Synthroid  ##Acute on chronic kidney disease stage III -Closely follow-up with the BMP after the contrast given  ##Aortic valve stenosis -Patient has moderate stenosis  ##Diabetes mellitus type 2 -Follow-up on sliding scale insulin  ##Debility -Valuated by PT, recommended inpatient rehab -Case management is following   DVT prophylaxis: Anticoagulation as above Code Status: Full code Family / Patient Communication: Patient's son is at bedside Disposition Plan: Inpatient rehab    Subjective: Patient is awake.  States that not feeling well after hearing patient is going to undergo transtibial amputation.    Objective: Vitals:   08/28/18 2058 08/28/18 2145 08/29/18 0523 08/29/18 1258  BP: (!) 98/56 140/77 130/60 (!) 117/55  Pulse: 67  64 63  Resp: 13  14 13   Temp: 98.9 F (37.2 C)  97.8 F (36.6 C) 99 F (37.2 C)  TempSrc: Axillary  Oral Oral  SpO2: 95%  95% 97%  Weight:   115.7 kg   Height:        Intake/Output Summary (Last 24 hours) at 08/29/2018 1440 Last data filed at 08/29/2018 1300 Gross per 24 hour  Intake -  Output 1725 ml  Net -1725 ml   Filed Weights   08/27/18 0435 08/28/18 0500 08/29/18 0523  Weight: 112.9 kg 110.2 kg 115.7 kg    Physical Examination:  General exam: Appears calm and comfortable  Respiratory system: Clear to auscultation. Respiratory effort normal. Cardiovascular system: S1 &  S2 heard, RRR. No JVD, murmurs, rubs, gallops or clicks. No pedal edema. Gastrointestinal system: Abdomen is nondistended, soft and nontender. No  organomegaly or masses felt. Normal bowel sounds heard. Central nervous system: Alert and oriented. No focal neurological deficits. Extremities: Symmetric 5 x 5 power. Skin: Gangrenous changes to the right foot with surrounding cellulitis as shown below.  Also has right heel wound Psychiatry: Judgement and insight appear normal. Mood & affect appropriate.          Data Reviewed: I have personally reviewed following labs and imaging studies  CBC: Recent Labs  Lab 08/24/18 0214 08/25/18 0139 08/26/18 0509 08/27/18 0641 08/27/18 1936  WBC 13.9* 13.7* 19.1* 13.1* 11.2*  NEUTROABS 10.3*  --   --   --   --   HGB 7.3* 8.3* 8.9* 8.7* 8.8*  HCT 21.6* 24.6* 26.6* 25.2* 27.2*  MCV 92.3 92.5 92.0 90.0 94.4  PLT 384 395 368 319 614   Basic Metabolic Panel: Recent Labs  Lab 08/23/18 0944 08/24/18 0214 08/25/18 0139 08/25/18 0717 08/26/18 0509 08/27/18 0641 08/27/18 1936 08/28/18 0408 08/29/18 0359  NA 127* 123* 125* 126*  --   --  126*  --   --   K 3.8 4.2 4.1 5.0  --   --  4.0  --   --   CL 94* 92* 93* 94*  --   --  94*  --   --   CO2 23 21* 23 19*  --   --  23  --   --   GLUCOSE 171* 168* 223* 183*  --   --  212*  --   --   BUN 16 15 16 17   --   --  21  --   --   CREATININE 1.26* 1.20 1.26* 1.23 1.18 1.27* 1.18 1.12 1.18  CALCIUM 8.2* 8.1* 8.0* 8.4*  --   --  8.1*  --   --    GFR: Estimated Creatinine Clearance: 55.3 mL/min (by C-G formula based on SCr of 1.18 mg/dL). Liver Function Tests: No results for input(s): AST, ALT, ALKPHOS, BILITOT, PROT, ALBUMIN in the last 168 hours. No results for input(s): LIPASE, AMYLASE in the last 168 hours. No results for input(s): AMMONIA in the last 168 hours. Coagulation Profile: No results for input(s): INR, PROTIME in the last 168 hours. Cardiac Enzymes: No results for input(s): CKTOTAL, CKMB, CKMBINDEX, TROPONINI in the last 168 hours. BNP (last 3 results) No results for input(s): PROBNP in the last 8760 hours. HbA1C: No  results for input(s): HGBA1C in the last 72 hours. CBG: Recent Labs  Lab 08/28/18 1130 08/28/18 1712 08/28/18 2101 08/29/18 0731 08/29/18 1117  GLUCAP 190* 186* 149* 166* 284*   Lipid Profile: No results for input(s): CHOL, HDL, LDLCALC, TRIG, CHOLHDL, LDLDIRECT in the last 72 hours. Thyroid Function Tests: No results for input(s): TSH, T4TOTAL, FREET4, T3FREE, THYROIDAB in the last 72 hours. Anemia Panel: No results for input(s): VITAMINB12, FOLATE, FERRITIN, TIBC, IRON, RETICCTPCT in the last 72 hours. Sepsis Labs: No results for input(s): PROCALCITON, LATICACIDVEN in the last 168 hours.  Recent Results (from the past 240 hour(s))  Culture, blood (routine x 2)     Status: None   Collection Time: 08/20/18  5:35 PM   Specimen: BLOOD  Result Value Ref Range Status   Specimen Description BLOOD RIGHT ANTECUBITAL  Final   Special Requests   Final    BOTTLES DRAWN AEROBIC AND ANAEROBIC Blood Culture adequate volume   Culture  Final    NO GROWTH 5 DAYS Performed at [facility], [place] [address] [place], [place] [address]    Report Status 08/25/2018 FINAL  Final  Culture, blood (routine x 2)     Status: None   Collection Time: 08/20/18  6:01 PM   Specimen: BLOOD  Result Value Ref Range Status   Specimen Description BLOOD RIGHT ANTECUBITAL  Final   Special Requests   Final    BOTTLES DRAWN AEROBIC AND ANAEROBIC Blood Culture adequate volume   Culture   Final    NO GROWTH 5 DAYS Performed at [facility], [place] [address] [place], [place] [address]    Report Status 08/25/2018 FINAL  Final  SARS Coronavirus 2 [facility] order, Performed in [facility] hospital lab) Nasopharyngeal Urine, Clean Catch     Status: None   Collection Time: 08/20/18  8:17 PM   Specimen: Urine, Clean Catch; Nasopharyngeal  Result Value Ref Range Status   SARS Coronavirus 2 NEGATIVE NEGATIVE Final    Comment: (NOTE) If result is NEGATIVE SARS-CoV-2 target nucleic acids are NOT DETECTED.  The SARS-CoV-2 RNA is generally detectable in upper and lower  respiratory specimens during the acute phase of infection. The lowest  concentration of SARS-CoV-2 viral copies this assay can detect is 250  copies / mL. A negative result does not preclude SARS-CoV-2 infection  and should not be used as the sole basis for treatment or other  patient management decisions.  A negative result may occur with  improper specimen collection / handling, submission of specimen other  than nasopharyngeal swab, presence of viral mutation(s) within the  areas targeted by this assay, and inadequate number of viral copies  (<250 copies / mL). A negative result must be combined with clinical  observations, patient history, and epidemiological information. If result is POSITIVE SARS-CoV-2 target nucleic acids are DETECTED. The SARS-CoV-2 RNA is generally detectable in upper and lower  respiratory specimens dur ing the acute phase of infection.  Positive  results are indicative of active infection with SARS-CoV-2.  Clinical  correlation with patient history and other diagnostic information is  necessary to determine patient infection status.  Positive results do  not rule out bacterial infection or co-infection with other viruses. If result is PRESUMPTIVE POSTIVE SARS-CoV-2 nucleic acids MAY BE PRESENT.   A presumptive positive result was obtained on the submitted specimen  and confirmed on repeat testing.  While 2019 novel coronavirus  (SARS-CoV-2) nucleic acids may be present in the submitted sample  additional confirmatory testing may be necessary for epidemiological  and / or clinical management purposes  to differentiate between  SARS-CoV-2 and other Sarbecovirus currently known to infect humans.  If clinically indicated additional testing with an alternate test  methodology [phone]) is advised. The SARS-CoV-2 RNA is generally  detectable in upper and lower respiratory sp ecimens during the acute   phase of infection. The expected result is Negative. Fact Sheet for Patients:  StrictlyIdeas.no Fact Sheet for Healthcare Providers: BankingDealers.co.za This test is not yet approved or cleared by the [place] FDA and has been authorized for detection and/or diagnosis of SARS-CoV-2 by FDA under an Emergency Use Authorization (EUA).  This EUA will remain in effect (meaning this test can be used) for the duration of the COVID-19 declaration under Section 564(b)(1) of the Act, 21 U.S.C. section 360bbb-3(b)(1), unless the authorization is terminated or revoked sooner. Performed at [facility], [place] [address] [place], [place] [address]  Surgical pcr screen     Status: Abnormal   Collection Time: 08/27/18 10:20 PM   Specimen: Nasal Mucosa; Nasal Swab  Result Value Ref Range Status   MRSA, PCR POSITIVE (A) NEGATIVE Final    Comment: RESULT CALLED TO, READ BACK BY AND VERIFIED WITH: Dorise Bullion RN 08/28/18 115 JDW    Staphylococcus aureus POSITIVE (A) NEGATIVE Final    Comment: (NOTE) The Xpert SA Assay (FDA approved for NASAL specimens in patients 20 years of age and older), is one component of a comprehensive surveillance program. It is not intended to diagnose infection nor to guide or monitor treatment. Performed at [facility], [place] [address] [place], [place] [address]       Radiology Studies: No results found.      Scheduled Meds: . aspirin EC  81 mg Oral Daily  . atorvastatin  40 mg Oral QPC supper  . Chlorhexidine Gluconate Cloth  6 each Topical Q0600  . Chlorhexidine Gluconate Cloth  6 each Topical Daily  . clopidogrel  75 mg Oral Daily  . docusate sodium  100 mg Oral BID  . dorzolamide-timolol  1 drop Both Eyes BID  . doxazosin  2 mg Oral QHS  . enoxaparin (LOVENOX) injection  40 mg Subcutaneous Q24H  . famotidine  20 mg Oral BID  . finasteride  5 mg Oral QPM  . insulin aspart  0-9 Units  Subcutaneous TID WC  . levothyroxine  50 mcg Oral Q0600  . mupirocin ointment  1 application Nasal BID  . pyridOXINE  100 mg Oral Daily  . sodium chloride flush  3 mL Intravenous Q12H  . tamsulosin  0.4 mg Oral BID  . vitamin C  500 mg Oral Daily  . vitamin E  400 Units Oral Daily   Continuous Infusions: . sodium chloride    . sodium chloride    . methocarbamol (ROBAXIN) IV          LOS: 8 days    Time spent: 56 minutes    Veronica Guerrant, MD [facility] Pager [phone]  If 7PM-7AM, please contact night-coverage [email or web address] Password [email or web address] 08/29/2018, 2:40 PM

## 2018-08-30 LAB — CREATININE, SERUM
Creatinine, Ser: 1.14 mg/dL (ref 0.61–1.24)
GFR calc Af Amer: >60 mL/min (ref >60)
GFR calc non Af Amer: 57 mL/min — ABNORMAL LOW (ref >60)

## 2018-08-30 LAB — GLUCOSE, CAPILLARY
Glucose-Capillary: 191 mg/dL — ABNORMAL HIGH (ref 70–99)
Glucose-Capillary: 245 mg/dL — ABNORMAL HIGH (ref 70–99)
Glucose-Capillary: 222 mg/dL — ABNORMAL HIGH (ref 70–99)
Glucose-Capillary: 209 mg/dL — ABNORMAL HIGH (ref 70–99)

## 2018-08-30 MED ORDER — POLYETHYLENE GLYCOL 3350 17 G PO PACK
17.0000 g | PACK | Freq: Every day | ORAL | Status: DC
  Administered 2018-08-30 – 2018-09-06 (×7): 17 g via ORAL
  Filled 2018-08-30 (×7): qty 1

## 2018-08-30 NOTE — TOC Initial Note (Signed)
Transition of Care [facility]) - Initial/Assessment Note    Patient Details  Name: [name] MRN: [id number] Date of Birth: 04/28/1927  [facility] CM/SW Contact:    Bary Castilla, LCSW Phone Number: [phone] 08/30/2018, 12:48 PM  Clinical Narrative:                 CSW met with patient and patient's son [name] to discuss the SNF process. Patient was in and sleep most of time therefore CSW spoke with son [name] and patient's two other children over the phone. CSW explained that the patient was being assessed for the inpatient rehab however if not accepted will have a back up plan.  CSW provided son [name] with [email or web address] rating list and patient's children agreed to discuss it and come up with facilities they want the referrals sent. CSW explained that sending out referrls would not obligate them to send patient to the facility. They stated that they prefer for patient to stay at hospital because they will be able to visit him.  CSW will follow up with patient's daughter tomorrow to get the list of facilities that they have agreed to send out referrals.   Expected Discharge Plan: Skilled Nursing Facility Barriers to Discharge: Continued Medical Work up, SNF Pending bed offer   Patient Goals and CMS Choice   CMS Medicare.gov Compare Post Acute Care list provided to:: Patient Represenative (must comment)(Travis son) Choice offered to / list presented to : Adult Children  Expected Discharge Plan and Services Expected Discharge Plan: [place] arrangements for the past 2 months: Single Family Home                                      Prior Living Arrangements/Services Living arrangements for the past 2 months: Single Family Home Lives with:: Self Patient language and need for interpreter reviewed:: Yes Do you feel safe going back to the place where you live?: Yes        Care giver support system in place?: Yes (comment)       Activities of Daily Living Home Assistive Devices/Equipment: None ADL Screening (condition at time of admission) Patient's cognitive ability adequate to safely complete daily activities?: Yes Is the patient deaf or have difficulty hearing?: No Does the patient have difficulty seeing, even when wearing glasses/contacts?: No Does the patient have difficulty concentrating, remembering, or making decisions?: No Patient able to express need for assistance with ADLs?: Yes Does the patient have difficulty dressing or bathing?: Yes Independently performs ADLs?: No Communication: Independent Dressing (OT): Needs assistance Is this a change from baseline?: Pre-admission baseline Grooming: Independent Feeding: Independent Bathing: Needs assistance Is this a change from baseline?: Change from baseline, expected to last >3 days Toileting: Needs assistance Is this a change from baseline?: Change from baseline, expected to last >3days In/Out Bed: Needs assistance Is this a change from baseline?: Change from baseline, expected to last >3 days Walks in Home: Needs assistance Is this a change from baseline?: Change from baseline, expected to last >3 days Does the patient have difficulty walking or climbing stairs?: Yes Weakness of Legs: Both Weakness of Arms/Hands: Both  Permission Sought/Granted   Permission granted to share information with : Yes, Verbal Permission Granted  Share Information with NAME: [name]  Permission granted to share info w AGENCY: SNFs  Permission granted to  share info w Relationship: son  Permission granted to share info w Contact Information: [phone]  Emotional Assessment Appearance:: Appears stated age Attitude/Demeanor/Rapport: Unable to Assess Affect (typically observed): Unable to Assess Orientation: : Oriented to Self, Oriented to Place, Oriented to  Time      Admission diagnosis:  Occult blood positive stool [R19.5] Cellulitis of right lower  extremity [L03.115] AKI (acute kidney injury) ([place]) [N17.9] Severe peripheral arterial disease (HCC) [I73.9] Ischemic foot [I99.8] Patient Active Problem List   Diagnosis Date Noted  . Gangrene of right foot ([place])   . DNR (do not resuscitate) discussion   . Goals of care, counseling/discussion   . Palliative care by specialist   . Type 2 diabetes mellitus with foot ulcer and gangrene ([place])   . Severe protein-calorie malnutrition ([place])   . Ischemic foot 08/20/2018  . Critical lower limb ischemia 08/03/2018  . Type 2 diabetes mellitus with foot ulcer, without long-term current use of insulin ([place])   . Gastroesophageal reflux disease   . Cellulitis of great toe of right foot 05/29/2018  . Atrial fibrillation, chronic   . Chest pain 07/25/2017  . PAF (paroxysmal atrial fibrillation) ([place]) 07/25/2017  . BPH (benign prostatic hyperplasia) 07/25/2017  . Shortness of breath 05/11/2017  . Hypothyroidism 05/11/2017  . Chronic diastolic CHF (congestive heart failure) ([place]) 05/11/2017  . Aortic valve stenosis 06/12/2016  . RBBB 06/12/2016  . PAD (peripheral artery disease) ([place]) 08/04/2012  . Essential hypertension 08/04/2012  . Hyperlipidemia 08/04/2012  . Dizziness 08/04/2012   PCP:  [name], MD Pharmacy:   [facility] [place], [place] [facility] [place] [address] [place] [address] Phone: [phone] Fax: [phone]  [facility] [place], [place] [address] [place] [address] Phone: [phone] Fax: [phone]     Social Determinants of Health (SDOH) Interventions    Readmission Risk Interventions Readmission Risk Prevention Plan 08/28/2018  Transportation Screening Complete  PCP or Specialist Appt within 3-5 Days Not Complete  Not Complete comments plan for SNF  HRI or [place] Not Complete  HRI or Home Care Consult comments plan  for SNF  Social Work Consult for [place] Planning/Counseling Complete  Palliative Care Screening Complete  Medication Review (RN Care Manager) Complete  Some recent data might be hidden

## 2018-08-30 NOTE — Progress Notes (Signed)
Patient ID: [name], male   DOB: [birth year], 85 y.o.   MRN: [id number] Postoperative day 2 right transtibial amputation.  There is no drainage in the wound VAC canister patient is alert this morning without complaints.  Patient is safe from an orthopedic standpoint for discharge to skilled nursing at this time with the portable [facility] wound VAC pump.  Orders are written for discharge for Pra[facility].[name]

## 2018-08-30 NOTE — Progress Notes (Signed)
PROGRESS NOTE    [name]  GXQ:[id number]  DOB: 17-Jul-1927  DOA: 08/20/2018 PCP: [name], MD  Brief Narrative:  85 year old male with history of hypertension, hyperlipidemia, peripheral vascular disease, underwent stent placement to left SFA in 2012.  Patient developed infection after toenail was clipped in April 2020 requiring multiple rounds of antibiotics also worsening of the lower extremity edema.  Ultrasound showed ABI of 0.43 on the right with 0 vessel runoff.  Underwent peripheral angiography on 07/27/2018 by Dr. Alvester Chou revealed mid to distal left SFA stent patent, high-grade calcified disease under 0 vessel runoff on the left as well as on the right with a 99% calcified lesions in the distal right SFA/above-knee popliteal artery.  Underwent angioplasty followed by drug-coated balloon angioplasty to the distal right SFA.  Scented to emergency department on 08/20/2018 with worsening of the pain over the last 1 month with a discoloration of the foot.  Patient was initiated on broad-spectrum antibiotics in the emergency department.  Patient's initial lab work-up showed sodium of 124, creatinine of 1.4.  Patient underwent right lower extremity angiography with angioplasty on 08/25/2018.  Postprocedure patient was noted to have a hemoglobin of 7.3, received 2 units of packed RBC.  ##Right lower extremity cellulitis with dry gangrenous toes -Continue with cefepime, clindamycin -Continue with heparin drip -Continue with Plavix -Orthopedic surgery is following, underwent transtibial amputation on 08/28/2018 considering patient's extensive gangrenous discoloration extending into the foot. -Continue with wound VAC per Ortho recommendations   ##Peripheral vascular disease -S/p PTCA in 2012 -Underwent angiogram, angioplasty on 07/27/2018 by Dr. Alvester Chou, has been compliant with medications -Admitted with gangrenous toes -Underwent angiogram on 08/25/2018 with angioplasty of anterior tibial artery  with balloon angioplasty, post to revascularization appeared to be good inline flow all the way to the toes. -Continue with DAPT with aspirin, Plavix and statin.  ##Anemia acute on chronic blood loss -Hemoglobin trended down to 7.3 -Received 2 units of packed RBC on 08/25/2011 -Keep the patient on IV iron  ##Encephalopathy metabolic/toxic -Resolved  ##Hypertension -Continue with home medications  ##Hypothyroidism -Continue with Synthroid  ##Acute on chronic kidney disease stage III -Closely follow-up with the BMP after the contrast given  ##Aortic valve stenosis -Patient has moderate stenosis  ##Diabetes mellitus type 2 -Follow-up on sliding scale insulin  ##Constipation -Keep the patient on stool softeners, laxatives  ##Debility -Valuated by PT, recommended inpatient rehab -Case management is following   DVT prophylaxis: Anticoagulation as above Code Status: Full code Family / Patient Communication: Patient's son is at bedside Disposition Plan: Inpatient rehab    Subjective: Patient is awake.  States that not feeling well after hearing patient is going to undergo transtibial amputation.    Objective: Vitals:   08/29/18 2000 08/29/18 2217 08/30/18 0615 08/30/18 1306  BP:  (!) 127/54 140/63 (!) 134/47  Pulse:    (!) 57  Resp: 12 20 18 12   Temp:  98.8 F (37.1 C) 97.8 F (36.6 C) 98.2 F (36.8 C)  TempSrc:  Oral Oral Oral  SpO2:  98% 98% 97%  Weight:   109 kg   Height:        Intake/Output Summary (Last 24 hours) at 08/30/2018 1526 Last data filed at 08/30/2018 1029 Gross per 24 hour  Intake 420 ml  Output 1950 ml  Net -1530 ml   Filed Weights   08/28/18 0500 08/29/18 0523 08/30/18 0615  Weight: 110.2 kg 115.7 kg 109 kg    Physical Examination:  General exam: Appears calm  and comfortable  Respiratory system: Clear to auscultation. Respiratory effort normal. Cardiovascular system: S1 & S2 heard, RRR. No JVD, murmurs, rubs, gallops or clicks. No  pedal edema. Gastrointestinal system: Abdomen is nondistended, soft and nontender. No organomegaly or masses felt. Normal bowel sounds heard. Central nervous system: Alert and oriented. No focal neurological deficits. Extremities: Symmetric 5 x 5 power. Skin: Gangrenous changes to the right foot with surrounding cellulitis as shown below.  Also has right heel wound Psychiatry: Judgement and insight appear normal. Mood & affect appropriate.          Data Reviewed: I have personally reviewed following labs and imaging studies  CBC: Recent Labs  Lab 08/24/18 0214 08/25/18 0139 08/26/18 0509 08/27/18 0641 08/27/18 1936  WBC 13.9* 13.7* 19.1* 13.1* 11.2*  NEUTROABS 10.3*  --   --   --   --   HGB 7.3* 8.3* 8.9* 8.7* 8.8*  HCT 21.6* 24.6* 26.6* 25.2* 27.2*  MCV 92.3 92.5 92.0 90.0 94.4  PLT 384 395 368 319 270   Basic Metabolic Panel: Recent Labs  Lab 08/24/18 0214 08/25/18 0139 08/25/18 0717  08/27/18 0641 08/27/18 1936 08/28/18 0408 08/29/18 0359 08/30/18 0359  NA 123* 125* 126*  --   --  126*  --   --   --   K 4.2 4.1 5.0  --   --  4.0  --   --   --   CL 92* 93* 94*  --   --  94*  --   --   --   CO2 21* 23 19*  --   --  23  --   --   --   GLUCOSE 168* 223* 183*  --   --  212*  --   --   --   BUN 15 16 17   --   --  21  --   --   --   CREATININE 1.20 1.26* 1.23   < > 1.27* 1.18 1.12 1.18 1.14  CALCIUM 8.1* 8.0* 8.4*  --   --  8.1*  --   --   --    < > = values in this interval not displayed.   GFR: Estimated Creatinine Clearance: 55.6 mL/min (by C-G formula based on SCr of 1.14 mg/dL). Liver Function Tests: No results for input(s): AST, ALT, ALKPHOS, BILITOT, PROT, ALBUMIN in the last 168 hours. No results for input(s): LIPASE, AMYLASE in the last 168 hours. No results for input(s): AMMONIA in the last 168 hours. Coagulation Profile: No results for input(s): INR, PROTIME in the last 168 hours. Cardiac Enzymes: No results for input(s): CKTOTAL, CKMB, CKMBINDEX,  TROPONINI in the last 168 hours. BNP (last 3 results) No results for input(s): PROBNP in the last 8760 hours. HbA1C: No results for input(s): HGBA1C in the last 72 hours. CBG: Recent Labs  Lab 08/29/18 1117 08/29/18 1627 08/29/18 2220 08/30/18 0753 08/30/18 1115  GLUCAP 284* 174* 231* 191* 245*   Lipid Profile: No results for input(s): CHOL, HDL, LDLCALC, TRIG, CHOLHDL, LDLDIRECT in the last 72 hours. Thyroid Function Tests: No results for input(s): TSH, T4TOTAL, FREET4, T3FREE, THYROIDAB in the last 72 hours. Anemia Panel: No results for input(s): VITAMINB12, FOLATE, FERRITIN, TIBC, IRON, RETICCTPCT in the last 72 hours. Sepsis Labs: No results for input(s): PROCALCITON, LATICACIDVEN in the last 168 hours.  Recent Results (from the past 240 hour(s))  Culture, blood (routine x 2)     Status: None   Collection Time: 08/20/18  5:35  PM   Specimen: BLOOD  Result Value Ref Range Status   Specimen Description BLOOD RIGHT ANTECUBITAL  Final   Special Requests   Final    BOTTLES DRAWN AEROBIC AND ANAEROBIC Blood Culture adequate volume   Culture   Final    NO GROWTH 5 DAYS Performed at [facility], [address] [place], [place] [address]    Report Status 08/25/2018 FINAL  Final  Culture, blood (routine x 2)     Status: None   Collection Time: 08/20/18  6:01 PM   Specimen: BLOOD  Result Value Ref Range Status   Specimen Description BLOOD RIGHT ANTECUBITAL  Final   Special Requests   Final    BOTTLES DRAWN AEROBIC AND ANAEROBIC Blood Culture adequate volume   Culture   Final    NO GROWTH 5 DAYS Performed at [facility], [place] [address], [place], [place] [address]    Report Status 08/25/2018 FINAL  Final  SARS Coronavirus 2 [facility] order, Performed in [facility] hospital lab) Nasopharyngeal Urine, Clean Catch     Status: None   Collection Time: 08/20/18  8:17 PM   Specimen: Urine, Clean Catch; Nasopharyngeal  Result Value Ref Range Status   SARS  Coronavirus 2 NEGATIVE NEGATIVE Final    Comment: (NOTE) If result is NEGATIVE SARS-CoV-2 target nucleic acids are NOT DETECTED. The SARS-CoV-2 RNA is generally detectable in upper and lower  respiratory specimens during the acute phase of infection. The lowest  concentration of SARS-CoV-2 viral copies this assay can detect is 250  copies / mL. A negative result does not preclude SARS-CoV-2 infection  and should not be used as the sole basis for treatment or other  patient management decisions.  A negative result may occur with  improper specimen collection / handling, submission of specimen other  than nasopharyngeal swab, presence of viral mutation(s) within the  areas targeted by this assay, and inadequate number of viral copies  (<250 copies / mL). A negative result must be combined with clinical  observations, patient history, and epidemiological information. If result is POSITIVE SARS-CoV-2 target nucleic acids are DETECTED. The SARS-CoV-2 RNA is generally detectable in upper and lower  respiratory specimens dur ing the acute phase of infection.  Positive  results are indicative of active infection with SARS-CoV-2.  Clinical  correlation with patient history and other diagnostic information is  necessary to determine patient infection status.  Positive results do  not rule out bacterial infection or co-infection with other viruses. If result is PRESUMPTIVE POSTIVE SARS-CoV-2 nucleic acids MAY BE PRESENT.   A presumptive positive result was obtained on the submitted specimen  and confirmed on repeat testing.  While 2019 novel coronavirus  (SARS-CoV-2) nucleic acids may be present in the submitted sample  additional confirmatory testing may be necessary for epidemiological  and / or clinical management purposes  to differentiate between  SARS-CoV-2 and other Sarbecovirus currently known to infect humans.  If clinically indicated additional testing with an alternate test   methodology [phone]) is advised. The SARS-CoV-2 RNA is generally  detectable in upper and lower respiratory sp ecimens during the acute  phase of infection. The expected result is Negative. Fact Sheet for Patients:  StrictlyIdeas.no Fact Sheet for Healthcare Providers: BankingDealers.co.za This test is not yet approved or cleared by the [place] FDA and has been authorized for detection and/or diagnosis of SARS-CoV-2 by FDA under an Emergency Use Authorization (EUA).  This EUA will remain in effect (meaning  this test can be used) for the duration of the COVID-19 declaration under Section 564(b)(1) of the Act, 21 U.S.C. section 360bbb-3(b)(1), unless the authorization is terminated or revoked sooner. Performed at [facility], [place] [address], [place], [place] [address]   Surgical pcr screen     Status: Abnormal   Collection Time: 08/27/18 10:20 PM   Specimen: Nasal Mucosa; Nasal Swab  Result Value Ref Range Status   MRSA, PCR POSITIVE (A) NEGATIVE Final    Comment: RESULT CALLED TO, READ BACK BY AND VERIFIED WITH: Dorise Bullion RN 08/28/18 115 JDW    Staphylococcus aureus POSITIVE (A) NEGATIVE Final    Comment: (NOTE) The Xpert SA Assay (FDA approved for NASAL specimens in patients 82 years of age and older), is one component of a comprehensive surveillance program. It is not intended to diagnose infection nor to guide or monitor treatment. Performed at [facility], [place] [address], [place], [place] [address]       Radiology Studies: No results found.      Scheduled Meds: . aspirin EC  81 mg Oral Daily  . atorvastatin  40 mg Oral QPC supper  . Chlorhexidine Gluconate Cloth  6 each Topical Q0600  . Chlorhexidine Gluconate Cloth  6 each Topical Daily  . clopidogrel  75 mg Oral Daily  . docusate sodium  100 mg Oral BID  . dorzolamide-timolol  1 drop Both Eyes BID  . doxazosin  2 mg Oral QHS  . enoxaparin  (LOVENOX) injection  40 mg Subcutaneous Q24H  . famotidine  20 mg Oral BID  . finasteride  5 mg Oral QPM  . insulin aspart  0-9 Units Subcutaneous TID WC  . levothyroxine  50 mcg Oral Q0600  . mupirocin ointment  1 application Nasal BID  . pyridOXINE  100 mg Oral Daily  . sodium chloride flush  3 mL Intravenous Q12H  . tamsulosin  0.4 mg Oral BID  . vitamin C  500 mg Oral Daily  . vitamin E  400 Units Oral Daily   Continuous Infusions: . sodium chloride    . sodium chloride    . methocarbamol (ROBAXIN) IV          LOS: 9 days    Time spent: 81 minutes    Adalaide Jaskolski, MD [facility] Pager [phone]  If 7PM-7AM, please contact night-coverage [email or web address] Password [facility] 08/30/2018, 3:26 PM

## 2018-08-30 NOTE — Progress Notes (Signed)
Palliative:    Mr. Snowdon is resting quietly in bed.  He is talking to his sister on the phone.  He appears to be stable after surgery for right BKA.  Patient and family deny issues or concern at this time.  They are working toward rehab, their goal is CIR, but it remains to be seen whether Mr. Shallenberger can participate in extended rehab.  We talked about post surgery pain, pain and mobility improving with time.  Son [name] is at bedside and he keeps saying that Mr. Scheel is "fine".  Encouraged to call as needed.  PMT to follow peripherally as goals are set.  Extensive discussion with bedside nursing staff related to patient condition, needs, goals of care, disposition.  Plan:  Attempting CIR vs SNF rehab.  Continue code status discussions as patient and family allow.   25 minutes Quinn Axe, NP Palliative Medicine Team [phone] Greater than 50% of this time was spent counseling and coordinating care related to the above assessment and plan.

## 2018-08-31 LAB — BASIC METABOLIC PANEL
Anion gap: 8 (ref 5–15)
BUN: 21 mg/dL (ref 8–23)
CO2: 25 mmol/L (ref 22–32)
Calcium: 8 mg/dL — ABNORMAL LOW (ref 8.9–10.3)
Chloride: 96 mmol/L — ABNORMAL LOW (ref 98–111)
Creatinine, Ser: 1.22 mg/dL (ref 0.61–1.24)
GFR calc Af Amer: >60 mL/min (ref >60)
GFR calc non Af Amer: 52 mL/min — ABNORMAL LOW (ref >60)
Glucose, Bld: 188 mg/dL — ABNORMAL HIGH (ref 70–99)
Potassium: 4.5 mmol/L (ref 3.5–5.1)
Sodium: 129 mmol/L — ABNORMAL LOW (ref 135–145)

## 2018-08-31 LAB — GLUCOSE, CAPILLARY
Glucose-Capillary: 181 mg/dL — ABNORMAL HIGH (ref 70–99)
Glucose-Capillary: 223 mg/dL — ABNORMAL HIGH (ref 70–99)
Glucose-Capillary: 214 mg/dL — ABNORMAL HIGH (ref 70–99)

## 2018-08-31 LAB — CBC
HCT: 23.6 % — ABNORMAL LOW (ref 39.0–52.0)
Hemoglobin: 7.6 g/dL — ABNORMAL LOW (ref 13.0–17.0)
MCH: 31 pg (ref 26.0–34.0)
MCHC: 32.2 g/dL (ref 30.0–36.0)
MCV: 96.3 fL (ref 80.0–100.0)
Platelets: 283 10*3/uL (ref 150–400)
RBC: 2.45 MIL/uL — ABNORMAL LOW (ref 4.22–5.81)
RDW: 15.9 % — ABNORMAL HIGH (ref 11.5–15.5)
WBC: 12.5 10*3/uL — ABNORMAL HIGH (ref 4.0–10.5)
nRBC: 0 % (ref 0.0–0.2)

## 2018-08-31 LAB — MAGNESIUM: Magnesium: 2 mg/dL (ref 1.7–2.4)

## 2018-08-31 MED ORDER — SODIUM CHLORIDE 0.9 % IV SOLN
125.0000 mg | Freq: Once | INTRAVENOUS | Status: AC
  Administered 2018-09-01: 125 mg via INTRAVENOUS
  Filled 2018-08-31: qty 10

## 2018-08-31 MED ORDER — FUROSEMIDE 10 MG/ML IJ SOLN
40.0000 mg | Freq: Once | INTRAMUSCULAR | Status: AC
  Administered 2018-08-31: 40 mg via INTRAVENOUS
  Filled 2018-08-31: qty 4

## 2018-08-31 NOTE — Progress Notes (Signed)
Inpatient Rehab Admissions:  Inpatient Rehab Consult received.  I met with pt and his daughter at the bedside for rehabilitation assessment. Pt intermittently fell asleep during assessment but would follow commands and attend to tasks when cued. AC explained program details to both pt and his daughter and discussed anticipated length of stay, intensity of program, and recommended level of assist at DC. Daughter plans to discuss program with her brothers tonight and have collective decision by tomorrow. Pt appeared unsure about making his own decisions and answered most questions as "I don't know." AC will need to see how pt tolerates therapy session today. I explained that pt would need to show greater tolerance than previous session, and hopefully demonstrate OOB activity or attempt at transfer if able.   Will follow up tomorrow.   [name], OTR/L  Rehab Admissions Coordinator  [phone] 08/31/2018 2:07 PM

## 2018-08-31 NOTE — Progress Notes (Signed)
PROGRESS NOTE    [name]  VFI:[id number]  DOB: 06-Sep-1927  DOA: 08/20/2018 PCP: [name], MD  Brief Narrative:  85 year old male with history of hypertension, hyperlipidemia, peripheral vascular disease, underwent stent placement to left SFA in 2012.  Patient developed infection after toenail was clipped in April 2020 requiring multiple rounds of antibiotics also worsening of the lower extremity edema.  Ultrasound showed ABI of 0.43 on the right with 0 vessel runoff.  Underwent peripheral angiography on 07/27/2018 by Dr. Alvester Chou revealed mid to distal left SFA stent patent, high-grade calcified disease under 0 vessel runoff on the left as well as on the right with a 99% calcified lesions in the distal right SFA/above-knee popliteal artery.  Underwent angioplasty followed by drug-coated balloon angioplasty to the distal right SFA.  Scented to emergency department on 08/20/2018 with worsening of the pain over the last 1 month with a discoloration of the foot.  Patient was initiated on broad-spectrum antibiotics in the emergency department.  Patient's initial lab work-up showed sodium of 124, creatinine of 1.4.  Patient underwent right lower extremity angiography with angioplasty on 08/25/2018.  Postprocedure patient was noted to have a hemoglobin of 7.3, received 2 units of packed RBC.  ##Right lower extremity cellulitis with dry gangrenous toes -discontinued  cefepime, clindamycin -Continue with Plavix -Orthopedic surgery is following, underwent transtibial amputation on 08/28/2018 considering patient's extensive gangrenous discoloration extending into the foot. -Continue with wound VAC per Ortho recommendations   ##Peripheral vascular disease -S/p PTCA in 2012 -Underwent angiogram, angioplasty on 07/27/2018 by Dr. Alvester Chou, has been compliant with medications -Admitted with gangrenous toes -Underwent angiogram on 08/25/2018 with angioplasty of anterior tibial artery with balloon angioplasty,  post to revascularization appeared to be good inline flow all the way to the toes. -Continue with DAPT with aspirin, Plavix and statin.  ##Anemia acute blood loss -Hemoglobin trended down to 7.3 -Received 2 units of packed RBC on 08/25/2011 -Keep the patient on IV iron  ##Encephalopathy metabolic/toxic -Resolved  ##Hypertension -Continue with home medications  ##Hypothyroidism -Continue with Synthroid  ##Acute on chronic kidney disease stage III -Closely follow-up with the BMP after the contrast given  ##Aortic valve stenosis -Patient has moderate stenosis  ##Diabetes mellitus type 2 -Follow-up on sliding scale insulin  ##Constipation -Keep the patient on stool softeners, laxatives - good response  ##Hyponatraemia -hypervolemic hyponatraemia - give 1 dose of lasix  ##Debility -Evaluated by PT, recommended inpatient rehab -pending insurance approval to be DCed to CIR  DVT prophylaxis: Anticoagulation as above Code Status: Full code Family / Patient Communication: Patient's daughter is at bedside Disposition Plan: Inpatient rehab    Subjective: Improved pain  From yesterday. Tolerating diet well. Objective: Vitals:   08/30/18 2041 08/30/18 2041 08/31/18 0517 08/31/18 2039  BP: (!) 125/56 (!) 125/56 (!) 141/63 (!) 122/53  Pulse: 62 61  61  Resp:   16   Temp: 98.1 F (36.7 C) 98.1 F (36.7 C) 98.3 F (36.8 C) 98.2 F (36.8 C)  TempSrc: Oral Oral Oral Oral  SpO2: 98% 97% 97% 96%  Weight:   109 kg   Height:        Intake/Output Summary (Last 24 hours) at 08/31/2018 2331 Last data filed at 08/31/2018 1048 Gross per 24 hour  Intake 340 ml  Output 1500 ml  Net -1160 ml   Filed Weights   08/29/18 0523 08/30/18 0615 08/31/18 0517  Weight: 115.7 kg 109 kg 109 kg    Physical Examination:  General exam: Appears  calm and comfortable  Respiratory system: Clear to auscultation. Respiratory effort normal. Cardiovascular system: S1 & S2 heard, RRR. No JVD,  murmurs, rubs, gallops or clicks. No pedal edema. Gastrointestinal system: Abdomen is nondistended, soft and nontender. No organomegaly or masses felt. Normal bowel sounds heard. Central nervous system: Alert and oriented. No focal neurological deficits. Extremities: Symmetric 5 x 5 power. Skin: rt transtibial amputation in the dressing Psychiatry: Judgement and insight appear normal. Mood & affect appropriate.          Data Reviewed: I have personally reviewed following labs and imaging studies  CBC: Recent Labs  Lab 08/25/18 0139 08/26/18 0509 08/27/18 0641 08/27/18 1936 08/31/18 0539  WBC 13.7* 19.1* 13.1* 11.2* 12.5*  HGB 8.3* 8.9* 8.7* 8.8* 7.6*  HCT 24.6* 26.6* 25.2* 27.2* 23.6*  MCV 92.5 92.0 90.0 94.4 96.3  PLT 395 368 319 391 681   Basic Metabolic Panel: Recent Labs  Lab 08/25/18 0139 08/25/18 0717  08/27/18 1936 08/28/18 0408 08/29/18 0359 08/30/18 0359 08/31/18 0539  NA 125* 126*  --  126*  --   --   --  129*  K 4.1 5.0  --  4.0  --   --   --  4.5  CL 93* 94*  --  94*  --   --   --  96*  CO2 23 19*  --  23  --   --   --  25  GLUCOSE 223* 183*  --  212*  --   --   --  188*  BUN 16 17  --  21  --   --   --  21  CREATININE 1.26* 1.23   < > 1.18 1.12 1.18 1.14 1.22  CALCIUM 8.0* 8.4*  --  8.1*  --   --   --  8.0*  MG  --   --   --   --   --   --   --  2.0   < > = values in this interval not displayed.   GFR: Estimated Creatinine Clearance: 52 mL/min (by C-G formula based on SCr of 1.22 mg/dL). Liver Function Tests: No results for input(s): AST, ALT, ALKPHOS, BILITOT, PROT, ALBUMIN in the last 168 hours. No results for input(s): LIPASE, AMYLASE in the last 168 hours. No results for input(s): AMMONIA in the last 168 hours. Coagulation Profile: No results for input(s): INR, PROTIME in the last 168 hours. Cardiac Enzymes: No results for input(s): CKTOTAL, CKMB, CKMBINDEX, TROPONINI in the last 168 hours. BNP (last 3 results) No results for input(s):  PROBNP in the last 8760 hours. HbA1C: No results for input(s): HGBA1C in the last 72 hours. CBG: Recent Labs  Lab 08/30/18 1619 08/30/18 2117 08/31/18 0755 08/31/18 1708 08/31/18 2141  GLUCAP 222* 209* 181* 223* 214*   Lipid Profile: No results for input(s): CHOL, HDL, LDLCALC, TRIG, CHOLHDL, LDLDIRECT in the last 72 hours. Thyroid Function Tests: No results for input(s): TSH, T4TOTAL, FREET4, T3FREE, THYROIDAB in the last 72 hours. Anemia Panel: No results for input(s): VITAMINB12, FOLATE, FERRITIN, TIBC, IRON, RETICCTPCT in the last 72 hours. Sepsis Labs: No results for input(s): PROCALCITON, LATICACIDVEN in the last 168 hours.  Recent Results (from the past 240 hour(s))  Surgical pcr screen     Status: Abnormal   Collection Time: 08/27/18 10:20 PM   Specimen: Nasal Mucosa; Nasal Swab  Result Value Ref Range Status   MRSA, PCR POSITIVE (A) NEGATIVE Final    Comment: RESULT CALLED TO,  READ BACK BY AND VERIFIED WITH: Dorise Bullion RN 08/28/18 115 JDW    Staphylococcus aureus POSITIVE (A) NEGATIVE Final    Comment: (NOTE) The Xpert SA Assay (FDA approved for NASAL specimens in patients 75 years of age and older), is one component of a comprehensive surveillance program. It is not intended to diagnose infection nor to guide or monitor treatment. Performed at [facility], [place] [address] [place], [place] [address]       Radiology Studies: No results found.      Scheduled Meds: . aspirin EC  81 mg Oral Daily  . atorvastatin  40 mg Oral QPC supper  . Chlorhexidine Gluconate Cloth  6 each Topical Q0600  . Chlorhexidine Gluconate Cloth  6 each Topical Daily  . clopidogrel  75 mg Oral Daily  . docusate sodium  100 mg Oral BID  . dorzolamide-timolol  1 drop Both Eyes BID  . doxazosin  2 mg Oral QHS  . enoxaparin (LOVENOX) injection  40 mg Subcutaneous Q24H  . famotidine  20 mg Oral BID  . finasteride  5 mg Oral QPM  . insulin aspart  0-9 Units Subcutaneous TID  WC  . levothyroxine  50 mcg Oral Q0600  . mupirocin ointment  1 application Nasal BID  . polyethylene glycol  17 g Oral Daily  . pyridOXINE  100 mg Oral Daily  . sodium chloride flush  3 mL Intravenous Q12H  . tamsulosin  0.4 mg Oral BID  . vitamin C  500 mg Oral Daily  . vitamin E  400 Units Oral Daily   Continuous Infusions: . sodium chloride    . sodium chloride    . methocarbamol (ROBAXIN) IV          LOS: 10 days    Time spent: 68 minutes    Samaira Holzworth, MD [facility] Pager [phone]  If 7PM-7AM, please contact night-coverage [email or web address] Password [facility] 08/31/2018, 11:31 PM

## 2018-08-31 NOTE — Care Management Important Message (Signed)
Important Message  Patient Details  Name: [name] MRN: [id number] Date of Birth: 1927-04-20   Medicare Important Message Given:  Yes , gave IM letter to [place], RN,pt.  on precautions.     [place], [place] 08/31/2018, 1:38 PM

## 2018-08-31 NOTE — Progress Notes (Signed)
Inpatient Rehabilitation-Admissions Coordinator   Noted pt did very well with PT this afternoon. Feel he is an appropriate candidate for CIR. Will follow up tomorrow for final pt/family decision regarding venue preference and will await completion of OT evaluation needed for insurance authorization process.   Please call if questions.   [name], OTR/L  Rehab Admissions Coordinator  [phone] 08/31/2018 5:13 PM

## 2018-08-31 NOTE — H&P (Signed)
Physical Medicine and Rehabilitation Admission H&P    Chief Complaint  Patient presents with  . Foot Pain  . Foot Swelling  . Post-Op Peripheral Artery bypass  : HPI: [name] is an 85 year old right-handed male with history of diabetes mellitus, hypertension, hyperlipidemia, peripheral vascular disease maintained on aspirin and Plavix with recent revascularization procedure right lower extremity.  Per chart review patient lives alone completely independent up till 2 weeks ago driving doing his own grocery shopping independent ADLs.  He does have family in the area who plan to assist as needed on discharge.  Presented 08/24/2018 with gangrenous right foot and toe that has progressed over the past month.  Limb was not felt to be salvageable and underwent right transtibial amputation 08/28/2018 per Dr. Sharol Given and application of wound VAC.  Splint and shrinker provided by [employer] prosthetics.  Hospital course pain management.  Subcutaneous Lovenox for DVT prophylaxis.  Acute blood loss anemia7-6 8.2 and has received 2 units packed red blood cells with mild leukocytosis 12,500.  Mild hyponatremia 128 monitored.  Intermittent bouts of confusion felt to be related to narcotics/multifactorial.  Palliative care did follow-up to establish goals of care.  Therapy evaluation completed and patient was admitted for a comprehensive rehab program.  Review of Systems  Constitutional: Negative for chills and fever.  HENT: Positive for hearing loss.   Eyes: Negative for blurred vision and double vision.  Respiratory: Positive for shortness of breath. Negative for cough.   Cardiovascular: Positive for leg swelling. Negative for chest pain and palpitations.  Gastrointestinal: Positive for constipation. Negative for heartburn, nausea and vomiting.  Genitourinary: Negative for dysuria, flank pain and hematuria.  Musculoskeletal: Positive for myalgias.  Skin: Negative for rash.  All other systems reviewed and are  negative.  Past Medical History:  Diagnosis Date  . Arthritis   . Borderline diabetic   . Glaucoma   . Hyperlipidemia   . Hypertension   . PVD (peripheral vascular disease) ([place])   . Sleep apnea    Cpap ordered but doesnt use   Past Surgical History:  Procedure Laterality Date  . ABDOMINAL AORTOGRAM W/LOWER EXTREMITY N/A 07/27/2018   Procedure: ABDOMINAL AORTOGRAM W/LOWER EXTREMITY;  Surgeon: Lorretta Harp, MD;  Location: [facility];  Service: Cardiovascular;  Laterality: N/A;  . AMPUTATION Right 08/28/2018   Procedure: RIGHT BELOW KNEE AMPUTATION;  Surgeon: [name], MD;  Location: [facility];  Service: Orthopedics;  Laterality: Right;  . APPENDECTOMY  1943  . CATARACT EXTRACTION  2012   x2  . COLONOSCOPY N/A 11/01/2014   Procedure: COLONOSCOPY;  Surgeon: Aviva Signs, MD;  [facility];  Service: Gastroenterology;  Laterality: N/A;  . ESOPHAGOGASTRODUODENOSCOPY N/A 11/01/2014   Procedure: ESOPHAGOGASTRODUODENOSCOPY (EGD);  Surgeon: Aviva Signs, MD;  [facility];  Service: Gastroenterology;  Laterality: N/A;  . HERNIA REPAIR Left   . INGUINAL HERNIA REPAIR Right 03/01/2013   Procedure: RIGHT INGUINAL HERNIORRHAPHY;  Surgeon: Jamesetta So, MD;  Location: [facility];  Service: General;  Laterality: Right;  . INSERTION OF MESH Right 03/01/2013   Procedure: INSERTION OF MESH;  Surgeon: Jamesetta So, MD;  Location: [facility];  Service: General;  Laterality: Right;  . LOWER EXTREMITY ANGIOGRAPHY Right 08/25/2018   Procedure: LOWER EXTREMITY ANGIOGRAPHY;  Surgeon: Wellington Hampshire, MD;  Location: [facility];  Service: Cardiovascular;  Laterality: Right;  . NM MYOCAR PERF WALL MOTION  06/05/2010   no significant ischemia  . PERIPHERAL  VASCULAR ATHERECTOMY Right 08/03/2018   Procedure: PERIPHERAL VASCULAR ATHERECTOMY;  Surgeon: Lorretta Harp, MD;  Location: [facility];  Service: Cardiovascular;  Laterality: Right;  . stent  06/14/2010    left leg  . US ECHOCARDIOGRAPHY  01/21/2006   moderate mitral annular ca+, mild MR, AOV moderately sclerotic.   Family History  Problem Relation Age of Onset  . Cancer Mother   . Heart attack Father   . Stroke Father   . Heart attack Brother   . Heart attack Brother    Social History:  reports that he quit smoking about 83 years ago. He has quit using smokeless tobacco.  His smokeless tobacco use included chew. He reports that he does not drink alcohol or use drugs. Allergies:  Allergies  Allergen Reactions  . Iodinated Diagnostic Agents     Other reaction(s): Fever  . Iodine-131 Other (See Comments)    Breakout, fever  . Iohexol      Code: RASH, Desc: PT STATES HOT ALL OVER HAD TO HAVE ICE PACKS ALL OVER HIM AND DIFF BREATHING, PT REFUSED IV DYE    Facility-Administered Medications Prior to Admission  Medication Dose Route Frequency Provider Last Rate Last Dose  . 0.9 %  sodium chloride infusion  250 mL Intravenous PRN Lorretta Harp, MD       Medications Prior to Admission  Medication Sig Dispense Refill  . apixaban (ELIQUIS) 2.5 MG TABS tablet Take 1 tablet (2.5 mg total) by mouth 2 (two) times daily. 180 tablet 3  . [EXPIRED] aspirin EC 81 MG EC tablet Take 1 tablet (81 mg total) by mouth daily. Stop taking aspirin 09/04/2018. 30 tablet 0  . atorvastatin (LIPITOR) 40 MG tablet TAKE 1 TABLET BY MOUTH ONCE DAILY (Patient taking differently: Take 40 mg by mouth daily after supper. ) 90 tablet 3  . calcium carbonate (TUMS - DOSED IN MG ELEMENTAL CALCIUM) 500 MG chewable tablet Chew 1 tablet by mouth as needed for indigestion or heartburn.    . clopidogrel (PLAVIX) 75 MG tablet Take 1 tablet (75 mg total) by mouth daily with breakfast. 90 tablet 3  . docusate sodium (COLACE) 100 MG capsule Take 100 mg by mouth 2 (two) times daily as needed (for use with oxycodone to prevent constipation.).    [name] dorzolamide-timolol (COSOPT) 22.3-6.8 MG/ML ophthalmic solution Place 1 drop into both  eyes 2 (two) times daily.    [name] doxazosin (CARDURA) 2 MG tablet Take 1 tablet (2 mg total) by mouth at bedtime. 90 tablet 3  . finasteride (PROSCAR) 5 MG tablet Take 5 mg by mouth every evening.     . furosemide (LASIX) 40 MG tablet TAKE 1 TABLET(40 MG) BY MOUTH DAILY (Patient taking differently: Take 40 mg by mouth daily. TAKE 1 TABLET(40 MG) BY MOUTH DAILY) 90 tablet 3  . glipiZIDE (GLUCOTROL) 5 MG tablet Take 5 mg by mouth 2 (two) times daily.     . isosorbide mononitrate (IMDUR) 30 MG 24 hr tablet Take 0.5 tablets (15 mg total) by mouth daily. 15 tablet 3  . levothyroxine (SYNTHROID, LEVOTHROID) 50 MCG tablet Take 50 mcg by mouth daily before breakfast.   0  . metoprolol tartrate (LOPRESSOR) 25 MG tablet Take 12.5 mg as needed for palpitations/heart racing (Patient taking differently: Take 12.5 mg by mouth daily as needed (palpitations/heart racing). Take 12.5 mg as needed for palpitations/heart racin) 30 tablet 1  . Omega-3 Fatty Acids (FISH OIL PO) Take 1 capsule by mouth daily.    [name]  oxyCODONE-acetaminophen (PERCOCET) 5-325 MG tablet Take 1 tablet by mouth every 8 (eight) hours as needed for severe pain. 15 tablet 0  . pyridOXINE (VITAMIN B-6) 100 MG tablet Take 100 mg by mouth daily.    . tamsulosin (FLOMAX) 0.4 MG CAPS Take 0.4 mg by mouth 2 (two) times daily.     . vitamin C (ASCORBIC ACID) 500 MG tablet Take 500 mg by mouth daily.    . vitamin E 400 UNIT capsule Take 400 Units by mouth daily.    . diphenhydrAMINE (BENADRYL) 50 MG tablet PLEASE FOLLOW THE INSTRUCTIONS ON YOUR AFTER VISIT SUMMARY (Patient not taking: Reported on 08/20/2018) 1 tablet 0  . oxyCODONE-acetaminophen (PERCOCET) 5-325 MG tablet Take 1-2 tablets by mouth every 8 (eight) hours as needed for severe pain. (Patient not taking: Reported on 08/20/2018) 20 tablet 0    Drug Regimen Review Drug regimen was reviewed and remains appropriate with no significant issues identified  Home: Home Living Family/patient expects to be  discharged to:: Inpatient rehab Living Arrangements: Alone Additional Comments: pt lived alone and was completely indep up until 2 weeks ago   Functional History: Prior Function Level of Independence: Independent Comments: was driving, grocery shopping and amb without AD up until 2 weeks ago  Functional Status:  Mobility: Bed Mobility Overal bed mobility: Needs Assistance Bed Mobility: Supine to Sit Rolling: Min guard Sidelying to sit: Min assist, HOB elevated Supine to sit: Supervision, Modified independent (Device/Increase time) Sit to supine: Max assist General bed mobility comments: Use of hand rail to assist with mobility, increased time Transfers Overall transfer level: Needs assistance Equipment used: Rolling walker (2 wheeled) Transfer via Lift Equipment: Stedy Transfers: Sit to/from [employer] to Stand: Min assist, From elevated surface  Lateral/Scoot Transfers: Min assist, +2 physical assistance General transfer comment: patient able to pivot in standing with RW from bed to recliner toward left side. Min assist Ambulation/Gait Ambulation/Gait assistance: Min Web designer (Feet): 2 Feet Assistive device: Rolling walker (2 wheeled) General Gait Details: pivot to chair only, unable to hop    ADL: ADL Overall ADL's : Needs assistance/impaired Grooming: Min guard, Sitting Upper Body Bathing: Min guard, Sitting Lower Body Bathing: Moderate assistance, Sitting/lateral leans Upper Body Dressing : Min guard, Sitting Lower Body Dressing: Total assistance, Sit to/from stand Lower Body Dressing Details (indicate cue type and reason): unable to pull sock up in long leg sitting in recliner Toilet Transfer: Requires drop arm, Minimal assistance, +2 for physical assistance Toilet Transfer Details (indicate cue type and reason): simulated to drop arm recliner  Toileting- Clothing Manipulation and Hygiene: Moderate assistance, Sitting/lateral lean Functional mobility  during ADLs: Cueing for sequencing, Cueing for safety, Minimal assistance General ADL Comments: pt limited by generalized weakness, decreased activity tolerance and impaired balance, verbal cues for sequencing and safety awareness  Cognition: Cognition Overall Cognitive Status: Within Functional Limits for tasks assessed Orientation Level: Oriented X4 Cognition Arousal/Alertness: Awake/alert Behavior During Therapy: WFL for tasks assessed/performed Overall Cognitive Status: Within Functional Limits for tasks assessed Area of Impairment: Problem solving Memory: Decreased short-term memory Following Commands: Follows one step commands consistently, Follows one step commands with increased time Problem Solving: Slow processing, Requires verbal cues, Requires tactile cues General Comments: Slow processing/response time to commands; tactile cues  Physical Exam: Blood pressure (!) 143/64, pulse 60, temperature 97.9 F (36.6 C), temperature source Oral, resp. rate 16, height 5' 11.5" (1.816 m), weight 104 kg, SpO2 97 %. Physical Exam  Constitutional: He is oriented to person,  place, and time. No distress.  HENT:  Head: Normocephalic and atraumatic.  Healing sore on lip  Eyes: Pupils are equal, round, and reactive to light. EOM are normal.  Neck: Normal range of motion. No tracheal deviation present. No thyromegaly present.  Cardiovascular: Normal rate.  Murmur heard. IRR IRR  Respiratory: Effort normal and breath sounds normal. No respiratory distress. He has no wheezes.  GI: Soft. He exhibits no distension. There is no abdominal tenderness.  Musculoskeletal: Normal range of motion.     Comments: Right leg in vac, some associated swelling  Neurological: He is alert and oriented to person, place, and time.  Patient is alert.  He is a bit hard of hearing.  Makes good eye contact with examiner.  His son is at bedside.  He was able to provide his name and age as well as place.  He did have  some difficulty for his hospital course. UE 5/5. RLE: can lift against gravity. LLE: 3/5 HF, 4/5 KE, ADF/PF. No focal sensory findings.   Skin: He is not diaphoretic.  BKA site is dressed with limb guard in place as well as wound VAC  Psychiatric: He has a normal mood and affect. His behavior is normal.    Results for orders placed or performed during the hospital encounter of 08/20/18 (from the past 48 hour(s))  Glucose, capillary     Status: Abnormal   Collection Time: 09/05/18 11:29 AM  Result Value Ref Range   Glucose-Capillary 148 (H) 70 - 99 mg/dL   Comment 1 Notify RN    Comment 2 Document in Chart   Glucose, capillary     Status: Abnormal   Collection Time: 09/05/18  3:47 PM  Result Value Ref Range   Glucose-Capillary 154 (H) 70 - 99 mg/dL   Comment 1 Notify RN    Comment 2 Document in Chart   Glucose, capillary     Status: Abnormal   Collection Time: 09/05/18  8:33 PM  Result Value Ref Range   Glucose-Capillary 177 (H) 70 - 99 mg/dL  Creatinine, serum     Status: Abnormal   Collection Time: 09/06/18  4:19 AM  Result Value Ref Range   Creatinine, Ser 1.10 0.61 - 1.24 mg/dL   GFR calc non Af Amer 59 (L) >60 mL/min   GFR calc Af Amer >60 >60 mL/min    Comment: Performed at [facility], [address], [place], [place] [address]  Glucose, capillary     Status: Abnormal   Collection Time: 09/06/18  7:18 AM  Result Value Ref Range   Glucose-Capillary 110 (H) 70 - 99 mg/dL  Glucose, capillary     Status: Abnormal   Collection Time: 09/06/18 11:13 AM  Result Value Ref Range   Glucose-Capillary 154 (H) 70 - 99 mg/dL  Glucose, capillary     Status: Abnormal   Collection Time: 09/06/18  4:11 PM  Result Value Ref Range   Glucose-Capillary 147 (H) 70 - 99 mg/dL  Glucose, capillary     Status: Abnormal   Collection Time: 09/06/18  8:45 PM  Result Value Ref Range   Glucose-Capillary 164 (H) 70 - 99 mg/dL  Basic metabolic panel     Status: Abnormal   Collection Time:  09/07/18  3:44 AM  Result Value Ref Range   Sodium 130 (L) 135 - 145 mmol/L   Potassium 4.3 3.5 - 5.1 mmol/L   Chloride 100 98 - 111 mmol/L   CO2 19 (L) 22 -  32 mmol/L   Glucose, Bld 132 (H) 70 - 99 mg/dL   BUN 19 8 - 23 mg/dL   Creatinine, Ser 1.12 0.61 - 1.24 mg/dL   Calcium 8.3 (L) 8.9 - 10.3 mg/dL   GFR calc non Af Amer 58 (L) >60 mL/min   GFR calc Af Amer >60 >60 mL/min   Anion gap 11 5 - 15    Comment: Performed at [facility] [address], [place], [place] [address]  Glucose, capillary     Status: Abnormal   Collection Time: 09/07/18  7:45 AM  Result Value Ref Range   Glucose-Capillary 128 (H) 70 - 99 mg/dL   No results found.     Medical Problem List and Plan: 1.  Decreased functional mobility secondary to right transtibial amputation 08/28/2018 as well as recent right lower extremity revascularization procedure.    -admit to inpatient rehab  -continue Wound VAC as directed (he is 10 days post-op)--dc soon?--check with ortho 2.  Antithrombotics: -DVT/anticoagulation: Lovenox  -antiplatelet therapy: Aspirin 81 mg daily, Plavix 75 mg daily 3. Pain Management: Robaxin and oxycodone as needed. 4. Mood: Provide emotional support  -antipsychotic agents: N/A 5. Neuropsych: This patient is capable of making decisions on his own behalf. 6. Skin/Wound Care: continue prevena vac per ortho. He is POD #10---dc soon? 7. Fluids/Electrolytes/Nutrition: Routine in and outs with follow-up chemistries on admit.  8.ABLA.  Follow-up CBC 9.  Hypertension.  Cardura 2 mg nightly.  Monitor with increased mobility 10.  Diabetes mellitus, hemoglobin A1c 7.0.  SSI.  Check blood sugars before meals and at bedtime.  Lantus insulin 10 units daily.  Patient on Glucotrol 5 mg twice daily prior to admission.   - Resume glucotrol as needed  -fair control at present. 11.  BPH.  Proscar 5 mg daily, Flomax 0.4 mg twice daily.  Check PVR 12.  Hypothyroidism.  Synthroid 13.  Constipation.   Laxative assistance 14.  Hyperlipidemia.  Lipitor     Lavon Paganini Angiulli, PA-C 09/07/2018

## 2018-08-31 NOTE — Progress Notes (Signed)
Physical Therapy Treatment Patient Details Name: [name] MRN: [id number] DOB: February 25, 1927 Today's Date: 08/31/2018    History of Present Illness [name] is a 85 y.o. male with history of PVD who has recently underwent R LE intervention for peripheral vascular disease presents to the ER because of worsening pain and redness of the right lower extremity.  Patient has been having a gangrenous toe which has been progressing getting worse over the last 1 month and has progressed to dorsal foot. Pt underwent R BKA on 8/14.    PT Comments    Pt asleep on entry however easily roused and willing to work with therapy. Pt is limited in safe mobility by change to CoG and resulting change in balance and coordination, in the presence of generalized weakeness and decreased endurance from being in bed prior to surgery. Pt is modA for bed mobility mainly for scooting hips to EoB, minA for lateral scooting to drop arm recliner and maxA for 2x attempt to come to standing. Pt has strength to stand but difficulty with coordinating the action. Pt states "I didn't realize it would be so hard but I need to work on it. Based on pt's improved level of activity and desire to get back to mobility PT recommends CIR level d/c if family can orchestrate 24 hour care at discharge. PT will continue to follow acutely.   Follow Up Recommendations  CIR     Equipment Recommendations  (TBD)    Recommendations for Other Services Rehab consult     Precautions / Restrictions Precautions Precautions: Fall Precaution Comments: pt with R BKA and splint/shrinker from [facility] or Orthoses: Other Brace Other Brace: splint and shrinker from [employer]anger Restrictions Weight Bearing Restrictions: Yes RLE Weight Bearing: Non weight bearing    Mobility  Bed Mobility Overal bed mobility: Needs Assistance Bed Mobility: Supine to Sit;Sit to Supine     Supine to sit: HOB elevated;Mod assist     General bed  mobility comments: modA able to initiate LE movement to EoB, and bring trunk to partial upright with use of bedrail, requires pad scoot to bring hips to EoB  Transfers Overall transfer level: Needs assistance   Transfers: Lateral/Scoot Transfers;Sit to/from Stand Sit to Stand: Max assist        Lateral/Scoot Transfers: Min assist General transfer comment: min A for blocking L knee with scooting from bed to drop arm recliner, 2x attempt for sit>stand with maxA however unable to posteriorly tilt pelvis and extend knee to come to upright   Ambulation/Gait             General Gait Details: unable at this time         Balance Overall balance assessment: Needs assistance Sitting-balance support: Feet supported;Bilateral upper extremity supported Sitting balance-Leahy Scale: Fair Sitting balance - Comments: sits without outside support     Standing balance-Leahy Scale: Zero                              Cognition Arousal/Alertness: Awake/alert(sleepy on entry but woke up and able to participate) Behavior During Therapy: WFL for tasks assessed/performed Overall Cognitive Status: Impaired/Different from baseline Area of Impairment: Problem solving                             Problem Solving: Slow processing;Requires verbal cues;Requires tactile cues General Comments: generally slow with movement and  response to cues         General Comments General comments (skin integrity, edema, etc.): VSS      Pertinent Vitals/Pain Pain Assessment: Faces Faces Pain Scale: Hurts even more Pain Location: R LE with ROM into knee extension Pain Descriptors / Indicators: Grimacing;Operative site guarding Pain Intervention(s): Limited activity within patient's tolerance;Monitored during session;Repositioned           PT Goals (current goals can now be found in the care plan section) Acute Rehab PT Goals Patient Stated Goal: didn'tstate PT Goal Formulation:  With patient/family Time For Goal Achievement: 09/12/18 Potential to Achieve Goals: Good    Frequency    Min 3X/week      PT Plan Current plan remains appropriate       AM-PAC PT "6 Clicks" Mobility   Outcome Measure  Help needed turning from your back to your side while in a flat bed without using bedrails?: A Lot Help needed moving from lying on your back to sitting on the side of a flat bed without using bedrails?: A Lot Help needed moving to and from a bed to a chair (including a wheelchair)?: A Lot Help needed standing up from a chair using your arms (e.g., wheelchair or bedside chair)?: Total Help needed to walk in hospital room?: Total Help needed climbing 3-5 steps with a railing? : Total 6 Click Score: 9    End of Session Equipment Utilized During Treatment: Other (comment)(R LE splint) Activity Tolerance: Patient tolerated treatment well Patient left: with call bell/phone within reach;with family/visitor present;in chair;with chair alarm set(in chair position to eat lunch) Nurse Communication: Mobility status PT Visit Diagnosis: Unsteadiness on feet (R26.81);Difficulty in walking, not elsewhere classified (R26.2)     Time: 0383-3383 PT Time Calculation (min) (ACUTE ONLY): 26 min  Charges:  $Gait Training: 8-22 mins $Therapeutic Activity: 8-22 mins                     [name] B. Migdalia Dk PT, DPT Acute Rehabilitation Services Pager [phone] Office [phone]    [place] 08/31/2018, 4:07 PM

## 2018-08-31 NOTE — Progress Notes (Signed)
Inpatient Diabetes Program Recommendations  AACE/ADA: New Consensus Statement on Inpatient Glycemic Control (2015)  Target Ranges:  Prepandial:   less than 140 mg/dL      Peak postprandial:   less than 180 mg/dL (1-2 hours)      Critically ill patients:  140 - 180 mg/dL   Lab Results  Component Value Date   GLUCAP 181 (H) 08/31/2018   HGBA1C 7.0 (H) 05/29/2018    Review of Glycemic Control Results for [name], [name] (MRN [id number]) as of 08/31/2018 13:17  Ref. Range 08/30/2018 07:53 08/30/2018 11:15 08/30/2018 16:19 08/30/2018 21:17 08/31/2018 07:55  Glucose-Capillary Latest Ref Range: 70 - 99 mg/dL 191 (H) 245 (H) 222 (H) 209 (H) 181 (H)   Diabetes history: DM 2 Outpatient Diabetes medications:  Glipizide 5 mg bid  Current orders for Inpatient glycemic control:  Novolog sensitive tid with meals  Inpatient Diabetes Program Recommendations:    Blood sugars>goal. If appropriate consider adding Lantus 10 units daily while in the hospital.   Thanks  Adah Perl, RN, BC-ADM Inpatient Diabetes Coordinator Pager [phone]

## 2018-09-01 LAB — BASIC METABOLIC PANEL
Anion gap: 9 (ref 5–15)
BUN: 26 mg/dL — ABNORMAL HIGH (ref 8–23)
CO2: 25 mmol/L (ref 22–32)
Calcium: 8.1 mg/dL — ABNORMAL LOW (ref 8.9–10.3)
Chloride: 96 mmol/L — ABNORMAL LOW (ref 98–111)
Creatinine, Ser: 1.2 mg/dL (ref 0.61–1.24)
GFR calc Af Amer: >60 mL/min (ref >60)
GFR calc non Af Amer: 53 mL/min — ABNORMAL LOW (ref >60)
Glucose, Bld: 180 mg/dL — ABNORMAL HIGH (ref 70–99)
Potassium: 4.4 mmol/L (ref 3.5–5.1)
Sodium: 130 mmol/L — ABNORMAL LOW (ref 135–145)

## 2018-09-01 LAB — CBC
HCT: 25.3 % — ABNORMAL LOW (ref 39.0–52.0)
Hemoglobin: 8.2 g/dL — ABNORMAL LOW (ref 13.0–17.0)
MCH: 30.9 pg (ref 26.0–34.0)
MCHC: 32.4 g/dL (ref 30.0–36.0)
MCV: 95.5 fL (ref 80.0–100.0)
Platelets: 278 10*3/uL (ref 150–400)
RBC: 2.65 MIL/uL — ABNORMAL LOW (ref 4.22–5.81)
RDW: 16.1 % — ABNORMAL HIGH (ref 11.5–15.5)
WBC: 12.4 10*3/uL — ABNORMAL HIGH (ref 4.0–10.5)
nRBC: 0 % (ref 0.0–0.2)

## 2018-09-01 LAB — GLUCOSE, CAPILLARY
Glucose-Capillary: 166 mg/dL — ABNORMAL HIGH (ref 70–99)
Glucose-Capillary: 222 mg/dL — ABNORMAL HIGH (ref 70–99)
Glucose-Capillary: 219 mg/dL — ABNORMAL HIGH (ref 70–99)
Glucose-Capillary: 182 mg/dL — ABNORMAL HIGH (ref 70–99)

## 2018-09-01 LAB — NOVEL CORONAVIRUS, NAA (HOSP ORDER, SEND-OUT TO REF LAB; TAT 18-24 HRS): SARS-CoV-2, NAA: NOT DETECTED

## 2018-09-01 MED ORDER — INSULIN GLARGINE 100 UNIT/ML ~~LOC~~ SOLN
10.0000 [IU] | Freq: Every day | SUBCUTANEOUS | Status: DC
  Administered 2018-09-01 – 2018-09-07 (×7): 10 [IU] via SUBCUTANEOUS
  Filled 2018-09-01 (×7): qty 0.1

## 2018-09-01 NOTE — Progress Notes (Signed)
PROGRESS NOTE    [name]  ZOX:[id number]  DOB: 18-Nov-1927  DOA: 08/20/2018 PCP: [name], MD  Brief Narrative:  85 year old male with history of hypertension, hyperlipidemia, peripheral vascular disease, underwent stent placement to left SFA in 2012.  Patient developed infection after toenail was clipped in April 2020 requiring multiple rounds of antibiotics also worsening of the lower extremity edema.  Ultrasound showed ABI of 0.43 on the right with 0 vessel runoff.  Underwent peripheral angiography on 07/27/2018 by Dr. Alvester Chou revealed mid to distal left SFA stent patent, high-grade calcified disease under 0 vessel runoff on the left as well as on the right with a 99% calcified lesions in the distal right SFA/above-knee popliteal artery.  Underwent angioplasty followed by drug-coated balloon angioplasty to the distal right SFA.  Scented to emergency department on 08/20/2018 with worsening of the pain over the last 1 month with a discoloration of the foot.  Patient was initiated on broad-spectrum antibiotics in the emergency department.  Patient's initial lab work-up showed sodium of 124, creatinine of 1.4.  Patient underwent right lower extremity angiography with angioplasty on 08/25/2018.  Postprocedure patient was noted to have a hemoglobin of 7.3, received 2 units of packed RBC.  Assessment and plan:  ##Right lower extremity cellulitis with dry gangrenous toes -discontinued  cefepime, clindamycin -Continue with Plavix -Orthopedic surgery is following, underwent transtibial amputation on 08/28/2018 considering patient's extensive gangrenous discoloration extending into the foot. -Continue with wound VAC per Ortho recommendations  ##Peripheral vascular disease -S/p PTCA in 2012 -Underwent angiogram, angioplasty on 07/27/2018 by Dr. Alvester Chou, has been compliant with medications -Admitted with gangrenous toes -Underwent angiogram on 08/25/2018 with angioplasty of anterior tibial artery with  balloon angioplasty, post to revascularization appeared to be good inline flow all the way to the toes. -Continue with DAPT with aspirin, Plavix and statin.  ##Anemia acute blood loss -Hemoglobin trended down to 7.3 -Received 2 units of packed RBC on 08/25/2011.  Globin has remained stable since then. -Keep the patient on IV iron  ##Encephalopathy metabolic/toxic -Resolved  ##Hypertension.  Blood pressure very well controlled. -Continue with home medications  ##Hypothyroidism -Continue with Synthroid  ##Acute on chronic kidney disease stage III: Stable.  ##Aortic valve stenosis -Patient has moderate stenosis  ##Diabetes mellitus type 2: Blood sugar elevated.  Will start on Lantus 10 units and continue SSI.  ##Constipation: Resolved. -Keep the patient on stool softeners, laxatives  ##Hyponatraemia -hypervolemic hyponatraemia.  Sodium 130 and stable.  ##Debility -Evaluated by PT, recommended inpatient rehab -pending insurance approval to be DCed to CIR  DVT prophylaxis: Anticoagulation as above Code Status: Full code Family / Patient Communication: No family present at bedside. Disposition Plan: Inpatient rehab    Subjective: Patient seen and examined.  He has no complaints.  No pain. Objective: Vitals:   08/31/18 0517 08/31/18 2039 09/01/18 0631 09/01/18 0645  BP: (!) 141/63 (!) 122/53 (!) 152/58   Pulse:  61  61  Resp: 16  13   Temp: 98.3 F (36.8 C) 98.2 F (36.8 C)  (!) 97.3 F (36.3 C)  TempSrc: Oral Oral  Oral  SpO2: 97% 96%  97%  Weight: 109 kg   113 kg  Height:        Intake/Output Summary (Last 24 hours) at 09/01/2018 1457 Last data filed at 09/01/2018 0631 Gross per 24 hour  Intake -  Output 1500 ml  Net -1500 ml   Filed Weights   08/30/18 0615 08/31/18 0517 09/01/18 0645  Weight: 109  kg 109 kg 113 kg    Physical Examination:  General exam: Appears calm and comfortable  Respiratory system: Clear to auscultation. Respiratory effort normal.  Cardiovascular system: S1 & S2 heard, RRR. No JVD, murmurs, rubs, gallops or clicks. No pedal edema. Gastrointestinal system: Abdomen is nondistended, soft and nontender. No organomegaly or masses felt. Normal bowel sounds heard. Central nervous system: Alert and oriented. No focal neurological deficits. Extremities: Right BKA with wound VAC attached Skin: No rashes, lesions or ulcers Psychiatry: Judgement and insight appear poor. Mood & affect appropriate.   Data Reviewed: I have personally reviewed following labs and imaging studies  CBC: Recent Labs  Lab 08/26/18 0509 08/27/18 0641 08/27/18 1936 08/31/18 0539 09/01/18 0413  WBC 19.1* 13.1* 11.2* 12.5* 12.4*  HGB 8.9* 8.7* 8.8* 7.6* 8.2*  HCT 26.6* 25.2* 27.2* 23.6* 25.3*  MCV 92.0 90.0 94.4 96.3 95.5  PLT 368 319 391 283 761   Basic Metabolic Panel: Recent Labs  Lab 08/27/18 1936 08/28/18 0408 08/29/18 0359 08/30/18 0359 08/31/18 0539 09/01/18 0413  NA 126*  --   --   --  129* 130*  K 4.0  --   --   --  4.5 4.4  CL 94*  --   --   --  96* 96*  CO2 23  --   --   --  25 25  GLUCOSE 212*  --   --   --  188* 180*  BUN 21  --   --   --  21 26*  CREATININE 1.18 1.12 1.18 1.14 1.22 1.20  CALCIUM 8.1*  --   --   --  8.0* 8.1*  MG  --   --   --   --  2.0  --    GFR: Estimated Creatinine Clearance: 53.8 mL/min (by C-G formula based on SCr of 1.2 mg/dL). Liver Function Tests: No results for input(s): AST, ALT, ALKPHOS, BILITOT, PROT, ALBUMIN in the last 168 hours. No results for input(s): LIPASE, AMYLASE in the last 168 hours. No results for input(s): AMMONIA in the last 168 hours. Coagulation Profile: No results for input(s): INR, PROTIME in the last 168 hours. Cardiac Enzymes: No results for input(s): CKTOTAL, CKMB, CKMBINDEX, TROPONINI in the last 168 hours. BNP (last 3 results) No results for input(s): PROBNP in the last 8760 hours. HbA1C: No results for input(s): HGBA1C in the last 72 hours. CBG: Recent Labs   Lab 08/31/18 0755 08/31/18 1708 08/31/18 2141 09/01/18 0744 09/01/18 1122  GLUCAP 181* 223* 214* 166* 222*   Lipid Profile: No results for input(s): CHOL, HDL, LDLCALC, TRIG, CHOLHDL, LDLDIRECT in the last 72 hours. Thyroid Function Tests: No results for input(s): TSH, T4TOTAL, FREET4, T3FREE, THYROIDAB in the last 72 hours. Anemia Panel: No results for input(s): VITAMINB12, FOLATE, FERRITIN, TIBC, IRON, RETICCTPCT in the last 72 hours. Sepsis Labs: No results for input(s): PROCALCITON, LATICACIDVEN in the last 168 hours.  Recent Results (from the past 240 hour(s))  Surgical pcr screen     Status: Abnormal   Collection Time: 08/27/18 10:20 PM   Specimen: Nasal Mucosa; Nasal Swab  Result Value Ref Range Status   MRSA, PCR POSITIVE (A) NEGATIVE Final    Comment: RESULT CALLED TO, READ BACK BY AND VERIFIED WITH: Dorise Bullion RN 08/28/18 115 JDW    Staphylococcus aureus POSITIVE (A) NEGATIVE Final    Comment: (NOTE) The Xpert SA Assay (FDA approved for NASAL specimens in patients 45 years of age and older), is one component  of a comprehensive surveillance program. It is not intended to diagnose infection nor to guide or monitor treatment. Performed at [facility], [place] [address], [place], [place] [address]       Radiology Studies: No results found.      Scheduled Meds: . aspirin EC  81 mg Oral Daily  . atorvastatin  40 mg Oral QPC supper  . Chlorhexidine Gluconate Cloth  6 each Topical Q0600  . Chlorhexidine Gluconate Cloth  6 each Topical Daily  . clopidogrel  75 mg Oral Daily  . docusate sodium  100 mg Oral BID  . dorzolamide-timolol  1 drop Both Eyes BID  . doxazosin  2 mg Oral QHS  . enoxaparin (LOVENOX) injection  40 mg Subcutaneous Q24H  . famotidine  20 mg Oral BID  . finasteride  5 mg Oral QPM  . insulin aspart  0-9 Units Subcutaneous TID WC  . insulin glargine  10 Units Subcutaneous Daily  . levothyroxine  50 mcg Oral Q0600  . mupirocin ointment   1 application Nasal BID  . polyethylene glycol  17 g Oral Daily  . pyridOXINE  100 mg Oral Daily  . sodium chloride flush  3 mL Intravenous Q12H  . tamsulosin  0.4 mg Oral BID  . vitamin C  500 mg Oral Daily  . vitamin E  400 Units Oral Daily   Continuous Infusions: . sodium chloride    . sodium chloride    . methocarbamol (ROBAXIN) IV          LOS: 11 days    Time spent: 31 minutes   Darliss Cheney, MD [facility] Pager [phone]  If 7PM-7AM, please contact night-coverage [email or web address] Password [facility] 09/01/2018, 2:57 PM

## 2018-09-01 NOTE — Evaluation (Signed)
Occupational Therapy Evaluation Patient Details Name: [name] MRN: [id number] DOB: Jan 23, 1927 Today's Date: 09/01/2018    History of Present Illness [name] is a 85 y.o. male with history of PVD who has recently underwent R LE intervention for peripheral vascular disease presents to the ER because of worsening pain and redness of the right lower extremity.  Patient has been having a gangrenous toe which has been progressing getting worse over the last 1 month and has progressed to dorsal foot. Pt underwent R BKA on 8/14.   Clinical Impression   PTA patient independent. Admitted for above and limited by problem list below, including pain in R BKA, impaired balance, decreased activity tolerance and generalized weakness.  Patient completed bed mobility with min assist, lateral scoot transfers with min assist (unable to complete sit- stand today with max assist), UB ADLs with min guard and LB ADLs with mod- max assist (EOB, lateral leans).  Patient will benefit from intensive CIR level rehab in order to maximize independence and safety with ADLs/mobility prior to dc home, even if from w/c level. Will follow acutely.     Follow Up Recommendations  CIR;Supervision/Assistance - 24 hour    Equipment Recommendations  3 in 1 bedside commode(drop arm)    Recommendations for Other Services Rehab consult     Precautions / Restrictions Precautions Precautions: Fall Precaution Comments: pt with R BKA and splint/shrinker from [employer] Required Braces or Orthoses: Other Brace Other Brace: splint and shrinker from [employer] Restrictions Weight Bearing Restrictions: Yes RLE Weight Bearing: Non weight bearing      Mobility Bed Mobility Overal bed mobility: Needs Assistance Bed Mobility: Supine to Sit     Supine to sit: Min assist;HOB elevated     General bed mobility comments: increased time and effort, min assist to manage trunk and scoot to EOB   Transfers Overall transfer level:  Needs assistance   Transfers: Lateral/Scoot Transfers          Lateral/Scoot Transfers: Min assist General transfer comment: min assist for blocking L knee and cueing for seqencing/coordination with lateral scoot to chair on L side; attempted sit to stand but unable to fully ascend with max assist    Balance Overall balance assessment: Needs assistance Sitting-balance support: Feet supported;Bilateral upper extremity supported Sitting balance-Leahy Scale: Fair Sitting balance - Comments: initally requires min A with posterior lean, progressing towards min guard for safety with 0--2 hand support (preference to B UE support)     Standing balance-Leahy Scale: Zero                             ADL either performed or assessed with clinical judgement   ADL Overall ADL's : Needs assistance/impaired     Grooming: Min guard;Sitting   Upper Body Bathing: Min guard;Sitting   Lower Body Bathing: Moderate assistance;Sitting/lateral leans   Upper Body Dressing : Min guard;Sitting   Lower Body Dressing: Maximal assistance;Sitting/lateral leans   Toilet Transfer: Minimal assistance;Cueing for sequencing(lateral scoot ) Toilet Transfer Details (indicate cue type and reason): simulated to drop arm recliner  Toileting- Clothing Manipulation and Hygiene: Moderate assistance;Sitting/lateral lean       Functional mobility during ADLs: Minimal assistance;Cueing for sequencing;Cueing for safety General ADL Comments: pt limited by generalized weakness, decreased activity tolerance and impaired balance      Vision         Perception     Praxis  Pertinent Vitals/Pain Pain Assessment: Faces Faces Pain Scale: Hurts a little bit Pain Location: R LE  Pain Descriptors / Indicators: Grimacing;Discomfort;Operative site guarding Pain Intervention(s): Limited activity within patient's tolerance;Monitored during session;Repositioned     Hand Dominance Right    Extremity/Trunk Assessment Upper Extremity Assessment Upper Extremity Assessment: Generalized weakness   Lower Extremity Assessment Lower Extremity Assessment: Defer to PT evaluation       Communication Communication Communication: No difficulties   Cognition Arousal/Alertness: Awake/alert(asleep upon entry, but easliy awoken) Behavior During Therapy: WFL for tasks assessed/performed Overall Cognitive Status: Impaired/Different from baseline Area of Impairment: Problem solving;Memory;Following commands                     Memory: Decreased short-term memory Following Commands: Follows one step commands consistently;Follows one step commands with increased time     Problem Solving: Slow processing;Decreased initiation;Difficulty sequencing;Requires verbal cues;Requires tactile cues General Comments: increased time for processing and problem solving    General Comments  VSS    Exercises     Shoulder Instructions      Home Living Family/patient expects to be discharged to:: Inpatient rehab                                 Additional Comments: pt lived alone and was completely indep up until 2 weeks ago      Prior Functioning/Environment Level of Independence: Independent        Comments: was driving, grocery shopping and amb without AD up until 2 weeks ago        OT Problem List: Decreased strength;Impaired balance (sitting and/or standing);Decreased activity tolerance;Decreased safety awareness;Decreased knowledge of use of DME or AE;Decreased knowledge of precautions;Pain      OT Treatment/Interventions: Self-care/ADL training;DME and/or AE instruction;Therapeutic exercise;Therapeutic activities;Patient/family education;Balance training;Cognitive remediation/compensation    OT Goals(Current goals can be found in the care plan section) Acute Rehab OT Goals Patient Stated Goal: to get to rehab  OT Goal Formulation: With patient/family Time  For Goal Achievement: 09/15/18 Potential to Achieve Goals: Good  OT Frequency: Min 2X/week   Barriers to D/C:            Co-evaluation              AM-PAC OT "6 Clicks" Daily Activity     Outcome Measure Help from another person eating meals?: A Little Help from another person taking care of personal grooming?: A Little Help from another person toileting, which includes using toliet, bedpan, or urinal?: A Lot Help from another person bathing (including washing, rinsing, drying)?: A Lot Help from another person to put on and taking off regular upper body clothing?: A Little Help from another person to put on and taking off regular lower body clothing?: A Lot 6 Click Score: 15   End of Session Equipment Utilized During Treatment: Gait belt;Rolling walker Nurse Communication: Mobility status  Activity Tolerance: Patient tolerated treatment well Patient left: in chair;with call bell/phone within reach;with chair alarm set;with family/visitor present  OT Visit Diagnosis: Other abnormalities of gait and mobility (R26.89);Muscle weakness (generalized) (M62.81);Pain Pain - Right/Left: Right Pain - part of body: Leg                Time: 3154-0086 OT Time Calculation (min): 33 min Charges:  OT General Charges $OT Visit: 1 Visit OT Evaluation $OT Eval Moderate Complexity: 1 Mod OT Treatments $Self Care/Home Management : 8-22 mins  Delight Stare, OT Acute Rehabilitation Services Pager [phone] Office [phone]   Delight Stare 09/01/2018, 5:15 PM

## 2018-09-02 LAB — CREATININE, SERUM
Creatinine, Ser: 1.35 mg/dL — ABNORMAL HIGH (ref 0.61–1.24)
GFR calc Af Amer: 54 mL/min — ABNORMAL LOW (ref >60)
GFR calc non Af Amer: 46 mL/min — ABNORMAL LOW (ref >60)

## 2018-09-02 LAB — GLUCOSE, CAPILLARY
Glucose-Capillary: 159 mg/dL — ABNORMAL HIGH (ref 70–99)
Glucose-Capillary: 272 mg/dL — ABNORMAL HIGH (ref 70–99)
Glucose-Capillary: 217 mg/dL — ABNORMAL HIGH (ref 70–99)
Glucose-Capillary: 199 mg/dL — ABNORMAL HIGH (ref 70–99)

## 2018-09-02 MED ORDER — ALBUTEROL SULFATE (2.5 MG/3ML) 0.083% IN NEBU
2.5000 mg | INHALATION_SOLUTION | Freq: Four times a day (QID) | RESPIRATORY_TRACT | Status: DC
  Administered 2018-09-02 – 2018-09-03 (×3): 2.5 mg via RESPIRATORY_TRACT
  Filled 2018-09-02 (×3): qty 3

## 2018-09-02 NOTE — Progress Notes (Signed)
PROGRESS NOTE  [name] VQM:[id number] DOB: 1927-02-18 DOA: 08/20/2018 PCP: [name], MD  Brief History   85 year old male with history of hypertension, hyperlipidemia, peripheral vascular disease, underwent stent placement to left SFA in 2012.  Patient developed infection after toenail was clipped in April 2020 requiring multiple rounds of antibiotics also worsening of the lower extremity edema.  Ultrasound showed ABI of 0.43 on the right with 0 vessel runoff.  Underwent peripheral angiography on 07/27/2018 by Dr. Alvester Chou revealed mid to distal left SFA stent patent, high-grade calcified disease under 0 vessel runoff on the left as well as on the right with a 99% calcified lesions in the distal right SFA/above-knee popliteal artery.  Underwent angioplasty followed by drug-coated balloon angioplasty to the distal right SFA.  Scented to emergency department on 08/20/2018 with worsening of the pain over the last 1 month with a discoloration of the foot.  Patient was initiated on broad-spectrum antibiotics in the emergency department.  Patient's initial lab work-up showed sodium of 124, creatinine of 1.4.  Patient underwent right lower extremity angiography with angioplasty on 08/25/2018.  Postprocedure patient was noted to have a hemoglobin of 7.3, received 2 units of packed RBC.  Consultants  . Orthopedic surgery . Palliative care  Procedures  . Trantibial amputation of the Right lower extremity  Antibiotics   Anti-infectives (From admission, onward)   Start     Dose/Rate Route Frequency Ordered Stop   08/28/18 1330  ceFAZolin (ANCEF) IVPB 1 g/50 mL premix     1 g 100 mL/hr over 30 Minutes Intravenous Every 6 hours 08/28/18 1019 08/29/18 0308   08/28/18 0745  clindamycin (CLEOCIN) IVPB 900 mg     900 mg 100 mL/hr over 30 Minutes Intravenous To ShortStay Surgical 08/28/18 0744 08/28/18 0820   08/28/18 0600  ceFAZolin (ANCEF) IVPB 2g/100 mL premix     2 g 200 mL/hr over 30 Minutes  Intravenous To Short Stay 08/27/18 1921 08/28/18 0722   08/23/18 1800  ceFEPIme (MAXIPIME) 2 g in sodium chloride 0.9 % 100 mL IVPB  Status:  Discontinued     2 g 200 mL/hr over 30 Minutes Intravenous Every 12 hours 08/23/18 0954 08/24/18 1603   08/23/18 0930  clindamycin (CLEOCIN) IVPB 600 mg  Status:  Discontinued     600 mg 100 mL/hr over 30 Minutes Intravenous Every 8 hours 08/23/18 0925 08/24/18 1603   08/22/18 0845  ceFEPIme (MAXIPIME) 1 g in sodium chloride 0.9 % 100 mL IVPB  Status:  Discontinued     1 g 200 mL/hr over 30 Minutes Intravenous Every 8 hours 08/22/18 0840 08/23/18 0954   08/21/18 2200  vancomycin (VANCOCIN) 1,250 mg in sodium chloride 0.9 % 250 mL IVPB  Status:  Discontinued     1,250 mg 166.7 mL/hr over 90 Minutes Intravenous Every 24 hours 08/20/18 2111 08/21/18 1005   08/21/18 2200  vancomycin (VANCOCIN) IVPB 1000 mg/200 mL premix  Status:  Discontinued     1,000 mg 200 mL/hr over 60 Minutes Intravenous Every 24 hours 08/21/18 1005 08/23/18 0925   08/21/18 0400  piperacillin-tazobactam (ZOSYN) IVPB 3.375 g  Status:  Discontinued     3.375 g 12.5 mL/hr over 240 Minutes Intravenous Every 8 hours 08/20/18 1808 08/22/18 0840   08/20/18 2200  vancomycin (VANCOCIN) 2,000 mg in sodium chloride 0.9 % 500 mL IVPB     2,000 mg 250 mL/hr over 120 Minutes Intravenous  Once 08/20/18 2051 08/21/18 0100   08/20/18 1815  piperacillin-tazobactam (ZOSYN)  IVPB 3.375 g     3.375 g 12.5 mL/hr over 240 Minutes Intravenous  Once 08/20/18 1808 08/21/18 0024    .  [name]   Subjective  The patient is resting comfortably. No new complaints.  Objective   Vitals:  Vitals:   09/02/18 0445 09/02/18 1745  BP: (!) 137/58 (!) 160/67  Pulse: (!) 59   Resp:  14  Temp: 97.8 F (36.6 C)   SpO2: 97%     Exam:  Constitutional:  The patient is awake, alert, and oriented x 3. No acute distress. Respiratory:  . CTA bilaterally, no w/r/r.  . Respiratory effort normal. No retractions or  accessory muscle use Cardiovascular:  . RRR, no m/r/g . No LE extremity edema   . Normal pedal pulses Abdomen:  . Abdomen appears normal; no tenderness or masses . No hernias . No HSM Musculoskeletal:  . Right lower extremity stump is bandaged and booted. . Left lower extremity demonstrates no cyanosis, clubbing, or edema. Skin:  . No rashes, lesions, ulcers . palpation of skin: no induration or nodules Neurologic:  . CN 2-12 intact . Sensation all 4 extremities intact Psychiatric:  . Mental status o Mood, affect appropriate o Orientation to person, place, time  . judgment and insight appear intact     I have personally reviewed the following:   Today's Data  . Vitals, Creatinine, GFR  Scheduled Meds: . albuterol  2.5 mg Nebulization Q6H  . aspirin EC  81 mg Oral Daily  . atorvastatin  40 mg Oral QPC supper  . clopidogrel  75 mg Oral Daily  . docusate sodium  100 mg Oral BID  . dorzolamide-timolol  1 drop Both Eyes BID  . doxazosin  2 mg Oral QHS  . enoxaparin (LOVENOX) injection  40 mg Subcutaneous Q24H  . famotidine  20 mg Oral BID  . finasteride  5 mg Oral QPM  . insulin aspart  0-9 Units Subcutaneous TID WC  . insulin glargine  10 Units Subcutaneous Daily  . levothyroxine  50 mcg Oral Q0600  . polyethylene glycol  17 g Oral Daily  . pyridOXINE  100 mg Oral Daily  . sodium chloride flush  3 mL Intravenous Q12H  . tamsulosin  0.4 mg Oral BID  . vitamin C  500 mg Oral Daily  . vitamin E  400 Units Oral Daily   Continuous Infusions: . sodium chloride    . sodium chloride    . methocarbamol (ROBAXIN) IV      Principal Problem:   Ischemic foot Active Problems:   Essential hypertension   Aortic valve stenosis   Hypothyroidism   PAF (paroxysmal atrial fibrillation) (HCC)   Atrial fibrillation, chronic   Cellulitis of great toe of right foot   Type 2 diabetes mellitus with foot ulcer, without long-term current use of insulin (HCC)   Type 2 diabetes  mellitus with foot ulcer and gangrene (HCC)   Severe protein-calorie malnutrition (HCC)   Goals of care, counseling/discussion   Palliative care by specialist   DNR (do not resuscitate) discussion   Gangrene of right foot ([place])   LOS: 12 days    A & P  Right lower extremity cellulitis with dry gangrenous toes: S/P  transtibial amputation on 08/28/2018. Cefepime and clindamycin have been discontinued. His plavix has been discontinued.Orthopedic surgery is following. Continue with wound VAC per Ortho recommendations.  Peripheral vascular disease: S/p PTCA in 2012. Underwent angiogram, angioplasty on 07/27/2018 by Dr. Alvester Chou, has been compliant with  medications. Admitted with gangrenous toes. Underwent angiogram on 08/25/2018 with angioplasty of anterior tibial artery with balloon angioplasty, post to revascularization appeared to be good inline flow all the way to the toes. Continue with DAPT with aspirin, Plavix and statin.  Anemia acute blood loss: Hemoglobin trended down to 7.3 Received 2 units of packed RBC on 08/25/2011. Hemoglobin has remained stable since then. Continue IV iron.  Encephalopathy metabolic/toxic: Resolved.  Hypertension.  Blood pressure very well controlled. Continue with home medications.  Hypothyroidism: Continue with Synthroid.  Acute on chronic kidney disease stage III: Stable.  Aortic valve stenosis: Patient has moderate stenosis  Diabetes mellitus type 2: Blood sugar elevated.  Will start on Lantus 10 units and continue SSI.  Constipation: Resolved. Continue stool softeners, laxatives.  Hyponatraemia: Resolved. Sodium 130 and stable.   Debility: Evaluated by PT, recommended inpatient rehab. Pending insurance approval to be DCed to CIR.  I have seen and examined this patient myself. I have spent 35 minutes in his evaluation and care.  DVT prophylaxis: Anticoagulation as above Code Status: Full code Family / Patient Communication: No family  present at bedside. Disposition Plan: Inpatient rehab  Nayeli Calvert, DO [facility] Direct contact: see [email or web address]  7PM-7AM contact night coverage as above 09/02/2018, 7:45 PM  LOS: 12 days

## 2018-09-02 NOTE — Progress Notes (Signed)
Physical Therapy Treatment Patient Details Name: [name] MRN: [id number] DOB: 18-Dec-1927 Today's Date: 09/02/2018    History of Present Illness [name] is a 85 y.o. male with history of PVD who has recently underwent R LE intervention for peripheral vascular disease presents to the ER because of worsening pain and redness of the right lower extremity.  Patient has been having a gangrenous toe which has been progressing getting worse over the last 1 month and has progressed to dorsal foot. Pt underwent R BKA on 8/14.    PT Comments    Pt is much more alert today and ready to work with therapy. Pt continues to be limited in safe mobility by slowed process (although has improved over last couple of days) as well as decreased coordination possibly from change in CoG. Pt performed much better today with use of Stedy. He is currently min A for bed mobility and minA - min guard for sit>stand transfers with Stedy. Pt able to perform static standing with hands on min guard for 1x30 and 1x20 seconds. D/c plans remain appropriate as pt participation and family involvement are suitable for CIR rehab. PT will continue to follow acutely.     Follow Up Recommendations  CIR     Equipment Recommendations  (TBD)    Recommendations for Other Services Rehab consult     Precautions / Restrictions Precautions Precautions: Fall Precaution Comments: pt with R BKA and splint/shrinker from [place] or Orthoses: Other Brace Other Brace: splint and shrinker from hanger Restrictions Weight Bearing Restrictions: Yes RLE Weight Bearing: Non weight bearing    Mobility  Bed Mobility Overal bed mobility: Needs Assistance Bed Mobility: Supine to Sit;Sit to Supine     Supine to sit: HOB elevated;Min assist     General bed mobility comments: pt able to move LE across bed and bring trunk to upright however requires min A for pad scoot of hips to EoB  Transfers Overall transfer  level: Needs assistance   Transfers: Sit to/from Stand Sit to Stand: Min assist;Min guard         General transfer comment: minA for initial power up to [facility] from elevated bed surface, requires min guard for 2x sit>stand from elevated Stedy pads, required min A for final sit>stand before sitting back in recliner  Ambulation/Gait             General Gait Details: unable at this time   Chief Strategy Officer    Modified Rankin (Stroke Patients Only)       Balance Overall balance assessment: Needs assistance Sitting-balance support: Feet supported;Bilateral upper extremity supported Sitting balance-Leahy Scale: Fair Sitting balance - Comments: sits without outside support   Standing balance support: During functional activity;Bilateral upper extremity supported Standing balance-Leahy Scale: Poor Standing balance comment: requires B UE support on Stedy but able to static stand with min guard                            Cognition Arousal/Alertness: Awake/alert(sleepy on entry but woke up and able to participate) Behavior During Therapy: WFL for tasks assessed/performed Overall Cognitive Status: Impaired/Different from baseline Area of Impairment: Problem solving                             Problem Solving: Slow processing;Requires verbal cues;Requires tactile cues  General Comments: continues to have a slow response to questions and commands, however has improved       Exercises Other Exercises Other Exercises: static standing in Stedy, 1x 30 sec, 1x 20 sec    General Comments General comments (skin integrity, edema, etc.): VSS, wound vac in place      Pertinent Vitals/Pain Pain Assessment: No/denies pain           PT Goals (current goals can now be found in the care plan section) Acute Rehab PT Goals Patient Stated Goal: didn'tstate PT Goal Formulation: With patient/family Time For Goal Achievement:  09/12/18 Potential to Achieve Goals: Good    Frequency    Min 3X/week      PT Plan Current plan remains appropriate       AM-PAC PT "6 Clicks" Mobility   Outcome Measure  Help needed turning from your back to your side while in a flat bed without using bedrails?: A Lot Help needed moving from lying on your back to sitting on the side of a flat bed without using bedrails?: A Lot Help needed moving to and from a bed to a chair (including a wheelchair)?: A Lot Help needed standing up from a chair using your arms (e.g., wheelchair or bedside chair)?: Total Help needed to walk in hospital room?: Total Help needed climbing 3-5 steps with a railing? : Total 6 Click Score: 9    End of Session Equipment Utilized During Treatment: Other (comment)(R LE splint) Activity Tolerance: Patient tolerated treatment well Patient left: with call bell/phone within reach;with family/visitor present;in chair;with chair alarm set(in chair position to eat lunch) Nurse Communication: Mobility status PT Visit Diagnosis: Unsteadiness on feet (R26.81);Difficulty in walking, not elsewhere classified (R26.2)     Time: 4536-4680 PT Time Calculation (min) (ACUTE ONLY): 16 min  Charges:  $Therapeutic Activity: 8-22 mins                     Elya Diloreto B. Migdalia Dk PT, DPT Acute Rehabilitation Services Pager [phone] Office [phone]    [place] 09/02/2018, 1:57 PM

## 2018-09-02 NOTE — Progress Notes (Signed)
MEWS score 1 for RR; patient assessed.

## 2018-09-02 NOTE — Progress Notes (Signed)
Inpatient Rehabilitation-Admissions Coordinator   Family has committed to 24/7 A at [facility]. AC began insurance authorization process for possible admit. Will follow up once there is a determination.   [name], OTR/L  Rehab Admissions Coordinator  [phone] 09/02/2018 1:13 PM

## 2018-09-03 LAB — GLUCOSE, CAPILLARY
Glucose-Capillary: 135 mg/dL — ABNORMAL HIGH (ref 70–99)
Glucose-Capillary: 137 mg/dL — ABNORMAL HIGH (ref 70–99)
Glucose-Capillary: 212 mg/dL — ABNORMAL HIGH (ref 70–99)
Glucose-Capillary: 186 mg/dL — ABNORMAL HIGH (ref 70–99)

## 2018-09-03 LAB — BASIC METABOLIC PANEL
Anion gap: 9 (ref 5–15)
BUN: 22 mg/dL (ref 8–23)
CO2: 23 mmol/L (ref 22–32)
Calcium: 8.2 mg/dL — ABNORMAL LOW (ref 8.9–10.3)
Chloride: 97 mmol/L — ABNORMAL LOW (ref 98–111)
Creatinine, Ser: 1.14 mg/dL (ref 0.61–1.24)
GFR calc Af Amer: >60 mL/min (ref >60)
GFR calc non Af Amer: 57 mL/min — ABNORMAL LOW (ref >60)
Glucose, Bld: 138 mg/dL — ABNORMAL HIGH (ref 70–99)
Potassium: 4.4 mmol/L (ref 3.5–5.1)
Sodium: 129 mmol/L — ABNORMAL LOW (ref 135–145)

## 2018-09-03 MED ORDER — ALBUTEROL SULFATE (2.5 MG/3ML) 0.083% IN NEBU: 2.5000 mg | INHALATION_SOLUTION | Freq: Four times a day (QID) | RESPIRATORY_TRACT | Status: DC | PRN

## 2018-09-03 NOTE — Progress Notes (Signed)
PROGRESS NOTE  [name]:[id number] DOB: 02/05/1927 DOA: 08/20/2018 PCP: [name], MD  Brief History   85 year old male with history of hypertension, hyperlipidemia, peripheral vascular disease, underwent stent placement to left SFA in 2012.  Patient developed infection after toenail was clipped in April 2020 requiring multiple rounds of antibiotics also worsening of the lower extremity edema.  Ultrasound showed ABI of 0.43 on the right with 0 vessel runoff.  Underwent peripheral angiography on 07/27/2018 by Dr. Alvester Chou revealed mid to distal left SFA stent patent, high-grade calcified disease under 0 vessel runoff on the left as well as on the right with a 99% calcified lesions in the distal right SFA/above-knee popliteal artery.  Underwent angioplasty followed by drug-coated balloon angioplasty to the distal right SFA.  Scented to emergency department on 08/20/2018 with worsening of the pain over the last 1 month with a discoloration of the foot.  Patient was initiated on broad-spectrum antibiotics in the emergency department.  Patient's initial lab work-up showed sodium of 124, creatinine of 1.4.  Patient underwent right lower extremity angiography with angioplasty on 08/25/2018.  Postprocedure patient was noted to have a hemoglobin of 7.3, received 2 units of packed RBC.  Consultants  . Orthopedic surgery . Palliative care  Procedures  . Trantibial amputation of the Right lower extremity  Antibiotics   Anti-infectives (From admission, onward)   Start     Dose/Rate Route Frequency Ordered Stop   08/28/18 1330  ceFAZolin (ANCEF) IVPB 1 g/50 mL premix     1 g 100 mL/hr over 30 Minutes Intravenous Every 6 hours 08/28/18 1019 08/29/18 0308   08/28/18 0745  clindamycin (CLEOCIN) IVPB 900 mg     900 mg 100 mL/hr over 30 Minutes Intravenous To ShortStay Surgical 08/28/18 0744 08/28/18 0820   08/28/18 0600  ceFAZolin (ANCEF) IVPB 2g/100 mL premix     2 g 200 mL/hr over 30 Minutes  Intravenous To Short Stay 08/27/18 1921 08/28/18 0722   08/23/18 1800  ceFEPIme (MAXIPIME) 2 g in sodium chloride 0.9 % 100 mL IVPB  Status:  Discontinued     2 g 200 mL/hr over 30 Minutes Intravenous Every 12 hours 08/23/18 0954 08/24/18 1603   08/23/18 0930  clindamycin (CLEOCIN) IVPB 600 mg  Status:  Discontinued     600 mg 100 mL/hr over 30 Minutes Intravenous Every 8 hours 08/23/18 0925 08/24/18 1603   08/22/18 0845  ceFEPIme (MAXIPIME) 1 g in sodium chloride 0.9 % 100 mL IVPB  Status:  Discontinued     1 g 200 mL/hr over 30 Minutes Intravenous Every 8 hours 08/22/18 0840 08/23/18 0954   08/21/18 2200  vancomycin (VANCOCIN) 1,250 mg in sodium chloride 0.9 % 250 mL IVPB  Status:  Discontinued     1,250 mg 166.7 mL/hr over 90 Minutes Intravenous Every 24 hours 08/20/18 2111 08/21/18 1005   08/21/18 2200  vancomycin (VANCOCIN) IVPB 1000 mg/200 mL premix  Status:  Discontinued     1,000 mg 200 mL/hr over 60 Minutes Intravenous Every 24 hours 08/21/18 1005 08/23/18 0925   08/21/18 0400  piperacillin-tazobactam (ZOSYN) IVPB 3.375 g  Status:  Discontinued     3.375 g 12.5 mL/hr over 240 Minutes Intravenous Every 8 hours 08/20/18 1808 08/22/18 0840   08/20/18 2200  vancomycin (VANCOCIN) 2,000 mg in sodium chloride 0.9 % 500 mL IVPB     2,000 mg 250 mL/hr over 120 Minutes Intravenous  Once 08/20/18 2051 08/21/18 0100   08/20/18 1815  piperacillin-tazobactam (ZOSYN)  IVPB 3.375 g     3.375 g 12.5 mL/hr over 240 Minutes Intravenous  Once 08/20/18 1808 08/21/18 0024     .   Subjective  The patient is resting comfortably. No new complaints.  Objective   Vitals:  Vitals:   09/03/18 0851 09/03/18 1336  BP:    Pulse: 70 62  Resp: 18 18  Temp:    SpO2: 96% 96%    Exam:  Constitutional:  The patient is awake, alert, and oriented x 3. No acute distress. Respiratory:  . CTA bilaterally, no w/r/r.  . Respiratory effort normal. No retractions or accessory muscle use Cardiovascular:   . RRR, no m/r/g . No LE extremity edema   . Normal pedal pulses Abdomen:  . Abdomen appears normal; no tenderness or masses . No hernias . No HSM Musculoskeletal:  . Right lower extremity stump is bandaged and booted. . Left lower extremity demonstrates no cyanosis, clubbing, or edema. Skin:  . No rashes, lesions, ulcers . palpation of skin: no induration or nodules Neurologic:  . CN 2-12 intact . Sensation all 4 extremities intact Psychiatric:  . Mental status o Mood, affect appropriate o Orientation to person, place, time  . judgment and insight appear intact     I have personally reviewed the following:   Today's Data  . Vitals, Creatinine, GFR  Scheduled Meds: . aspirin EC  81 mg Oral Daily  . atorvastatin  40 mg Oral QPC supper  . clopidogrel  75 mg Oral Daily  . docusate sodium  100 mg Oral BID  . dorzolamide-timolol  1 drop Both Eyes BID  . doxazosin  2 mg Oral QHS  . enoxaparin (LOVENOX) injection  40 mg Subcutaneous Q24H  . famotidine  20 mg Oral BID  . finasteride  5 mg Oral QPM  . insulin aspart  0-9 Units Subcutaneous TID WC  . insulin glargine  10 Units Subcutaneous Daily  . levothyroxine  50 mcg Oral Q0600  . polyethylene glycol  17 g Oral Daily  . pyridOXINE  100 mg Oral Daily  . sodium chloride flush  3 mL Intravenous Q12H  . tamsulosin  0.4 mg Oral BID  . vitamin C  500 mg Oral Daily  . vitamin E  400 Units Oral Daily   Continuous Infusions: . sodium chloride    . sodium chloride    . methocarbamol (ROBAXIN) IV      Principal Problem:   Ischemic foot Active Problems:   Essential hypertension   Aortic valve stenosis   Hypothyroidism   PAF (paroxysmal atrial fibrillation) (HCC)   Atrial fibrillation, chronic   Cellulitis of great toe of right foot   Type 2 diabetes mellitus with foot ulcer, without long-term current use of insulin (HCC)   Type 2 diabetes mellitus with foot ulcer and gangrene (HCC)   Severe protein-calorie malnutrition  (HCC)   Goals of care, counseling/discussion   Palliative care by specialist   DNR (do not resuscitate) discussion   Gangrene of right foot ([place])   LOS: 13 days    A & P  Right lower extremity cellulitis with dry gangrenous toes: S/P  transtibial amputation on 08/28/2018. Cefepime and clindamycin have been discontinued. His plavix has been discontinued.Orthopedic surgery is following. Continue with wound VAC per Ortho recommendations. PT has recommended inpatient rehab primarily because of the changes to the patient's center of gravity following his amputation. They feel that the intensive therapy to help him deal with this change will have a  significant effect on his success with mobility in the future. They feel that the patient is very motivated and they feel that he is able and willing to participate in the intense therapy that the inpatient rehab environment would provide. Unfortunately insurance has denied access to inpatient rehab. Social work/case management had given my number to the insurance company to set up a peer to peer. I am unxious to discuss this with them. I have not heard from them yet.   Peripheral vascular disease: S/p PTCA in 2012. Underwent angiogram, angioplasty on 07/27/2018 by Dr. Alvester Chou, has been compliant with medications. Admitted with gangrenous toes. Underwent angiogram on 08/25/2018 with angioplasty of anterior tibial artery with balloon angioplasty, post to revascularization appeared to be good inline flow all the way to the toes. Continue with DAPT with aspirin, Plavix and statin.  Anemia acute blood loss: Hemoglobin trended down to 7.3 Received 2 units of packed RBC on 08/25/2011. Hemoglobin has remained stable since then. Continue IV iron.  Encephalopathy metabolic/toxic: Resolved.  Hypertension.  Blood pressure very well controlled. Continue with home medications.  Hypothyroidism: Continue with Synthroid.  Acute on chronic kidney disease stage III: Stable.   Aortic valve stenosis: Patient has moderate stenosis  Diabetes mellitus type 2: Blood sugar elevated.  Will start on Lantus 10 units and continue SSI.  Constipation: Resolved. Continue stool softeners, laxatives.  Hyponatraemia: Resolved. Sodium 130 and stable.   Debility: Evaluated by PT, recommended inpatient rehab. Pending insurance approval to be DCed to CIR.  I have seen and examined this patient myself. I have spent 32 minutes in his evaluation and care.  DVT prophylaxis: Anticoagulation as above Code Status: Full code Family / Patient Communication: No family present at bedside. Disposition Plan: Inpatient rehab  Gretta Samons, DO [facility] Direct contact: see [email or web address]  7PM-7AM contact night coverage as above 09/03/2018, 7:26 PM  LOS: 12 days

## 2018-09-03 NOTE — Progress Notes (Signed)
Inpatient Rehabilitation-Admissions Coordinator   Pt's insurance has issued an initial denial for CIR. Dr. [name] has agreed to complete peer to peer as an attempt to overturn the decision. AC has set up conference. Will await final insurance determination.    [name], OTR/L  Rehab Admissions Coordinator  [phone] 09/03/2018 10:56 AM

## 2018-09-04 LAB — BASIC METABOLIC PANEL
Anion gap: 8 (ref 5–15)
BUN: 21 mg/dL (ref 8–23)
CO2: 23 mmol/L (ref 22–32)
Calcium: 7.7 mg/dL — ABNORMAL LOW (ref 8.9–10.3)
Chloride: 97 mmol/L — ABNORMAL LOW (ref 98–111)
Creatinine, Ser: 1.09 mg/dL (ref 0.61–1.24)
GFR calc Af Amer: >60 mL/min (ref >60)
GFR calc non Af Amer: 60 mL/min — ABNORMAL LOW (ref >60)
Glucose, Bld: 174 mg/dL — ABNORMAL HIGH (ref 70–99)
Potassium: 4.3 mmol/L (ref 3.5–5.1)
Sodium: 128 mmol/L — ABNORMAL LOW (ref 135–145)

## 2018-09-04 LAB — GLUCOSE, CAPILLARY
Glucose-Capillary: 138 mg/dL — ABNORMAL HIGH (ref 70–99)
Glucose-Capillary: 216 mg/dL — ABNORMAL HIGH (ref 70–99)
Glucose-Capillary: 166 mg/dL — ABNORMAL HIGH (ref 70–99)
Glucose-Capillary: 135 mg/dL — ABNORMAL HIGH (ref 70–99)

## 2018-09-04 NOTE — Progress Notes (Signed)
Inpatient Rehabilitation-Admissions Coordinator   [facility] has received denial despite peer to peer conference. Pt's family would like to do next level expedited appeal. AC will begin expedited appeal once forms available.   Please call if questions.   [name], OTR/L  Rehab Admissions Coordinator  [phone] 09/04/2018 2:43 PM

## 2018-09-04 NOTE — Progress Notes (Signed)
PROGRESS NOTE  [name] XYI:[id number] DOB: [birth year] DOA: 08/20/2018 PCP: [name], MD  Brief History   85 year old male with history of hypertension, hyperlipidemia, peripheral vascular disease, underwent stent placement to left SFA in 2012.  Patient developed infection after toenail was clipped in April 2020 requiring multiple rounds of antibiotics also worsening of the lower extremity edema.  Ultrasound showed ABI of 0.43 on the right with 0 vessel runoff.  Underwent peripheral angiography on 07/27/2018 by Dr. Alvester Chou revealed mid to distal left SFA stent patent, high-grade calcified disease under 0 vessel runoff on the left as well as on the right with a 99% calcified lesions in the distal right SFA/above-knee popliteal artery.  Underwent angioplasty followed by drug-coated balloon angioplasty to the distal right SFA.  Scented to emergency department on 08/20/2018 with worsening of the pain over the last 1 month with a discoloration of the foot.  Patient was initiated on broad-spectrum antibiotics in the emergency department.  Patient's initial lab work-up showed sodium of 124, creatinine of 1.4.  Patient underwent right lower extremity angiography with angioplasty on 08/25/2018.  Postprocedure patient was noted to have a hemoglobin of 7.3, received 2 units of packed RBC.  The patient's insurance company has denied inpatient rehab for the patient. I have concluded a peer-to-peer with them. As they have persisted in their denial, the patient will be presented in an expedited appeal process. If he is ultimately denied, he will need to go to SNF.  Consultants  . Orthopedic surgery . Palliative care  Procedures  . Trans-tibial amputation of the Right lower extremity  Antibiotics   Anti-infectives (From admission, onward)   Start     Dose/Rate Route Frequency Ordered Stop   08/28/18 1330  ceFAZolin (ANCEF) IVPB 1 g/50 mL premix     1 g 100 mL/hr over 30 Minutes Intravenous Every 6 hours  08/28/18 1019 08/29/18 0308   08/28/18 0745  clindamycin (CLEOCIN) IVPB 900 mg     900 mg 100 mL/hr over 30 Minutes Intravenous To ShortStay Surgical 08/28/18 0744 08/28/18 0820   08/28/18 0600  ceFAZolin (ANCEF) IVPB 2g/100 mL premix     2 g 200 mL/hr over 30 Minutes Intravenous To Short Stay 08/27/18 1921 08/28/18 0722   08/23/18 1800  ceFEPIme (MAXIPIME) 2 g in sodium chloride 0.9 % 100 mL IVPB  Status:  Discontinued     2 g 200 mL/hr over 30 Minutes Intravenous Every 12 hours 08/23/18 0954 08/24/18 1603   08/23/18 0930  clindamycin (CLEOCIN) IVPB 600 mg  Status:  Discontinued     600 mg 100 mL/hr over 30 Minutes Intravenous Every 8 hours 08/23/18 0925 08/24/18 1603   08/22/18 0845  ceFEPIme (MAXIPIME) 1 g in sodium chloride 0.9 % 100 mL IVPB  Status:  Discontinued     1 g 200 mL/hr over 30 Minutes Intravenous Every 8 hours 08/22/18 0840 08/23/18 0954   08/21/18 2200  vancomycin (VANCOCIN) 1,250 mg in sodium chloride 0.9 % 250 mL IVPB  Status:  Discontinued     1,250 mg 166.7 mL/hr over 90 Minutes Intravenous Every 24 hours 08/20/18 2111 08/21/18 1005   08/21/18 2200  vancomycin (VANCOCIN) IVPB 1000 mg/200 mL premix  Status:  Discontinued     1,000 mg 200 mL/hr over 60 Minutes Intravenous Every 24 hours 08/21/18 1005 08/23/18 0925   08/21/18 0400  piperacillin-tazobactam (ZOSYN) IVPB 3.375 g  Status:  Discontinued     3.375 g 12.5 mL/hr over 240 Minutes Intravenous Every  8 hours 08/20/18 1808 08/22/18 0840   08/20/18 2200  vancomycin (VANCOCIN) 2,000 mg in sodium chloride 0.9 % 500 mL IVPB     2,000 mg 250 mL/hr over 120 Minutes Intravenous  Once 08/20/18 2051 08/21/18 0100   08/20/18 1815  piperacillin-tazobactam (ZOSYN) IVPB 3.375 g     3.375 g 12.5 mL/hr over 240 Minutes Intravenous  Once 08/20/18 1808 08/21/18 0024     .   Subjective  The patient is resting comfortably. No new complaints.  Objective   Vitals:  Vitals:   09/04/18 0830 09/04/18 1246  BP: (!) 157/71 (!)  122/59  Pulse: 60 65  Resp: 11 14  Temp:  97.8 F (36.6 C)  SpO2: 98% 98%    Exam:  Constitutional:  The patient is awake, alert, and oriented x 3. No acute distress. Respiratory:  . CTA bilaterally, no w/r/r.  . Respiratory effort normal. No retractions or accessory muscle use Cardiovascular:  . RRR, no m/r/g . No LE extremity edema   . Normal pedal pulses Abdomen:  . Abdomen appears normal; no tenderness or masses . No hernias . No HSM Musculoskeletal:  . Right lower extremity stump is bandaged and booted. . Left lower extremity demonstrates no cyanosis, clubbing, or edema. Skin:  . No rashes, lesions, ulcers . palpation of skin: no induration or nodules Neurologic:  . CN 2-12 intact . Sensation all 4 extremities intact Psychiatric:  . Mental status o Mood, affect appropriate o Orientation to person, place, time  . judgment and insight appear intact     I have personally reviewed the following:   Today's Data  . Vitals, Creatinine, GFR  Scheduled Meds: . aspirin EC  81 mg Oral Daily  . atorvastatin  40 mg Oral QPC supper  . clopidogrel  75 mg Oral Daily  . docusate sodium  100 mg Oral BID  . dorzolamide-timolol  1 drop Both Eyes BID  . doxazosin  2 mg Oral QHS  . enoxaparin (LOVENOX) injection  40 mg Subcutaneous Q24H  . famotidine  20 mg Oral BID  . finasteride  5 mg Oral QPM  . insulin aspart  0-9 Units Subcutaneous TID WC  . insulin glargine  10 Units Subcutaneous Daily  . levothyroxine  50 mcg Oral Q0600  . polyethylene glycol  17 g Oral Daily  . pyridOXINE  100 mg Oral Daily  . sodium chloride flush  3 mL Intravenous Q12H  . tamsulosin  0.4 mg Oral BID  . vitamin C  500 mg Oral Daily  . vitamin E  400 Units Oral Daily   Continuous Infusions: . sodium chloride    . sodium chloride    . methocarbamol (ROBAXIN) IV      Principal Problem:   Ischemic foot Active Problems:   Essential hypertension   Aortic valve stenosis   Hypothyroidism    PAF (paroxysmal atrial fibrillation) (HCC)   Atrial fibrillation, chronic   Cellulitis of great toe of right foot   Type 2 diabetes mellitus with foot ulcer, without long-term current use of insulin (HCC)   Type 2 diabetes mellitus with foot ulcer and gangrene (HCC)   Severe protein-calorie malnutrition (HCC)   Goals of care, counseling/discussion   Palliative care by specialist   DNR (do not resuscitate) discussion   Gangrene of right foot ([place])   LOS: 14 days    A & P  Right lower extremity cellulitis with dry gangrenous toes: S/P  transtibial amputation on 08/28/2018. Cefepime and clindamycin  have been discontinued. His plavix has been discontinued.Orthopedic surgery is following. Continue with wound VAC per Ortho recommendations. PT has recommended inpatient rehab primarily because of the changes to the patient's center of gravity following his amputation. They feel that the intensive therapy to help him deal with this change will have a significant effect on his success with mobility in the future. They feel that the patient is very motivated and they feel that he is able and willing to participate in the intense therapy that the inpatient rehab environment would provide. Unfortunately insurance has denied access to inpatient rehab. Social work/case management had given my number to the insurance company to set up a peer to peer.I discussed the patient with them at length today and they have upheld their denial. The patient will undergo an expedited denial process. Should this fail, he will have to go to SNF.  Peripheral vascular disease: S/p PTCA in 2012. Underwent angiogram, angioplasty on 07/27/2018 by Dr. Alvester Chou, has been compliant with medications. Admitted with gangrenous toes. Underwent angiogram on 08/25/2018 with angioplasty of anterior tibial artery with balloon angioplasty, post to revascularization appeared to be good inline flow all the way to the toes. Continue with DAPT with  aspirin, Plavix and statin.  Anemia acute blood loss: Hemoglobin trended down to 7.3 Received 2 units of packed RBC on 08/25/2011. Hemoglobin has remained stable since then. Continue IV iron.  Encephalopathy metabolic/toxic: Resolved.  Hypertension.  Blood pressure very well controlled. Continue with home medications.  Hypothyroidism: Continue with Synthroid.  Acute on chronic kidney disease stage III: Stable.  Aortic valve stenosis: Patient has moderate stenosis  Diabetes mellitus type 2: Blood sugar elevated.  Will start on Lantus 10 units and continue SSI.  Constipation: Resolved. Continue stool softeners, laxatives.  Hyponatraemia: Resolved. Sodium 130 and stable.   Debility: Evaluated by PT, recommended inpatient rehab. Pending insurance approval to be DCed to CIR.  I have seen and examined this patient myself. I have spent 45 minutes in his evaluation and care. More than 50% of this was spent in counseling with the patient and family and in discussing the patient with insurance peer-to peer.  DVT prophylaxis: Anticoagulation as above Code Status: Full code Family / Patient Communication: I have discussed the patient with his son who is at bedside at length. All questions answered to the best of my ability. Disposition Plan: Inpatient rehab  Chisom Muntean, DO [facility] Direct contact: see [email or web address]  7PM-7AM contact night coverage as above 09/04/2018, 5:02 PM  LOS: 12 days

## 2018-09-04 NOTE — Progress Notes (Signed)
Physical Therapy Treatment Patient Details Name: [name] MRN: [id number] DOB: 1927-10-29 Today's Date: 09/04/2018    History of Present Illness [name] is a 85 y.o. male with history of PVD who has recently underwent R LE intervention for peripheral vascular disease presents to the ER because of worsening pain and redness of the right lower extremity.  Patient has been having a gangrenous toe which has been progressing getting worse over the last 1 month and has progressed to dorsal foot. Pt underwent R BKA on 8/14.    PT Comments    Patient progressing well towards PT goals. Able to stand with Mod A of 2 from EOB using RW. Requires assist for standing balance but able to static stand for ~30-40 seconds with cues for hip extension and upright. Performed lateral scoot transfer to drop arm recliner with min A of 2. Instructed pt in exercise program for UE/LE strengthening. Pt highly motivated to participate in therapy. Noted to have drop in BP progressively throughout session.  Supine BP 122/59 Sitting BP 110/57 Sitting BP post transfer 103/56 Sitting BP after a few minutes 122/61 Reports some mild lightheadedness which resolved after a few minutes or when legs were elevated. Continue to recommend CIR. Will follow.  Follow Up Recommendations  CIR     Equipment Recommendations  Other (comment)(defer)    Recommendations for Other Services       Precautions / Restrictions Precautions Precautions: Fall Precaution Comments: pt with R BKA and splint/shrinker from [employer] Required Braces or Orthoses: Other Brace Other Brace: splint and shrinker from [employer] Restrictions Weight Bearing Restrictions: Yes RLE Weight Bearing: Non weight bearing    Mobility  Bed Mobility Overal bed mobility: Needs Assistance Bed Mobility: Rolling;Sidelying to Sit Rolling: Min guard Sidelying to sit: Min assist;HOB elevated       General bed mobility comments: Pt able to maneuver LLE better  this session, use of bed features to achieve EOB sitting with HOB slightly elevated  Transfers Overall transfer level: Needs assistance Equipment used: Rolling walker (2 wheeled) Transfers: Sit to/from Stand Sit to Stand: Mod assist;+2 safety/equipment;Min assist        Lateral/Scoot Transfers: Min assist;+2 physical assistance General transfer comment: Mod A of 2 to power to standing with cues for hand placement and use of momentum; lateral scoot towards left into chair with min A of 2; good initiation and participation with scooting with cues.  Ambulation/Gait                 Stairs             Wheelchair Mobility    Modified Rankin (Stroke Patients Only)       Balance Overall balance assessment: Needs assistance Sitting-balance support: Feet supported;Bilateral upper extremity supported Sitting balance-Leahy Scale: Poor Sitting balance - Comments: Requires UE support for sitting balance, close min guard.   Standing balance support: During functional activity Standing balance-Leahy Scale: Poor Standing balance comment: requires B UE support on RW and physical assist with cues for upright, hip extension and BUEs extension. Stood for ~30-40 seconds.                            Cognition Arousal/Alertness: Awake/alert Behavior During Therapy: WFL for tasks assessed/performed Overall Cognitive Status: Impaired/Different from baseline Area of Impairment: Problem solving                     Memory: Decreased  short-term memory Following Commands: Follows one step commands consistently;Follows one step commands with increased time     Problem Solving: Slow processing;Requires verbal cues;Requires tactile cues General Comments: Slow processing/response time to commands; tactile cues and multiple demos of BUE HEP      Exercises General Exercises - Upper Extremity Shoulder Flexion: AROM;Both;5 reps;Theraband;Seated Theraband Level (Shoulder  Flexion): Level 2 (Red) Shoulder Extension: AROM;Both;5 reps;Seated;Theraband Theraband Level (Shoulder Extension): Level 2 (Red) Elbow Flexion: AROM;Both;5 reps;Seated;Theraband Theraband Level (Elbow Flexion): Level 2 (Red) Elbow Extension: AROM;Both;5 reps;Seated;Theraband Theraband Level (Elbow Extension): Level 2 (Red) Chair Push Up: AROM;Both;5 reps Amputee Exercises Quad Sets: Right;5 reps;Supine Knee Flexion: Right;5 reps;Seated Knee Extension: Right;5 reps;Seated Chair Push Up: 10 reps;Seated;Strengthening    General Comments General comments (skin integrity, edema, etc.): Wound vac intact; 122/59 supine BP, Sitting BP 110/57, Sitting BP post transfer 103/56, sitting BP after a few minutes 122/61.      Pertinent Vitals/Pain Pain Assessment: Faces Faces Pain Scale: Hurts a little bit Pain Location: right residual limb Pain Descriptors / Indicators: Operative site guarding;Sore Pain Intervention(s): Repositioned;Monitored during session    Home Living                      Prior Function            PT Goals (current goals can now be found in the care plan section) Progress towards PT goals: Progressing toward goals    Frequency    Min 3X/week      PT Plan Current plan remains appropriate    Co-evaluation PT/OT/SLP Co-Evaluation/Treatment: Yes Reason for Co-Treatment: To address functional/ADL transfers;For patient/therapist safety PT goals addressed during session: Mobility/safety with mobility;Proper use of DME;Strengthening/ROM OT goals addressed during session: Strengthening/ROM;ADL's and self-care      AM-PAC PT "6 Clicks" Mobility   Outcome Measure  Help needed turning from your back to your side while in a flat bed without using bedrails?: A Little Help needed moving from lying on your back to sitting on the side of a flat bed without using bedrails?: A Little Help needed moving to and from a bed to a chair (including a wheelchair)?: A  Lot Help needed standing up from a chair using your arms (e.g., wheelchair or bedside chair)?: A Lot Help needed to walk in hospital room?: Total Help needed climbing 3-5 steps with a railing? : Total 6 Click Score: 12    End of Session Equipment Utilized During Treatment: Other (comment)(Right shrinker) Activity Tolerance: Patient tolerated treatment well Patient left: in chair;with call bell/phone within reach;with chair alarm set Nurse Communication: Mobility status PT Visit Diagnosis: Unsteadiness on feet (R26.81);Difficulty in walking, not elsewhere classified (R26.2)     Time: 2595-6387 PT Time Calculation (min) (ACUTE ONLY): 27 min  Charges:  $Therapeutic Activity: 8-22 mins                     Wray Kearns, PT, DPT Acute Rehabilitation Services Pager [phone] Office [place] 09/04/2018, 2:51 PM

## 2018-09-04 NOTE — Progress Notes (Signed)
Occupational Therapy Treatment Patient Details Name: [name] MRN: [id number] DOB: 1927-06-21 Today's Date: 09/04/2018    History of present illness OMARR [name] is a 85 y.o. male with history of PVD who has recently underwent R LE intervention for peripheral vascular disease presents to the ER because of worsening pain and redness of the right lower extremity.  Patient has been having a gangrenous toe which has been progressing getting worse over the last 1 month and has progressed to dorsal foot. Pt underwent R BKA on 8/14.   OT comments  Pt seen for ADL progression and functional mobility.Pt completed bed mobility with MIN A to reach EOB. Pt reports feeling dizzy once EOB (vitals listed below).  Pt MOD +2 for Sit>stand from EOB with RW initially and MIN A +2 to maintain standing balance with RW.  Pt MIN +2 for lateral scoot from EOB>recliner with MIN verbal cues for body mechanics and safety. Provided visual demonstration and written HEP for BUE therapeutic exercise with [place] theraband to increase BUE strength to facilitate increased independence in functional mobility/ADLs. Pt required MAX verbal/ tactile cues to achieve full ROM needed for BUE HEP. Will continue to follow for acute OT needs.   Supine: 122/59 EOB: 110/57 Chair: 103/56 End of session: 122/61  Follow Up Recommendations  CIR;Supervision/Assistance - 24 hour    Equipment Recommendations  3 in 1 bedside commode    Recommendations for Other Services      Precautions / Restrictions Precautions Precautions: Fall Precaution Comments: pt with R BKA and splint/shrinker from [employer] Required Braces or Orthoses: Other Brace Other Brace: splint and shrinker from [employer] Restrictions Weight Bearing Restrictions: Yes RLE Weight Bearing: Non weight bearing       Mobility Bed Mobility Overal bed mobility: Needs Assistance Bed Mobility: Rolling;Sidelying to Sit Rolling: Min guard Sidelying to sit: Min assist;HOB  elevated       General bed mobility comments: Pt able to maneuver LLE better this session, use of bed features to achieve EOB sitting with HOB slightly elevated  Transfers Overall transfer level: Needs assistance   Transfers: Sit to/from Stand Sit to Stand: Mod assist;+2 safety/equipment;Min assist(MOD +2 to initially power up into standing; MIN A to maintain balance in standing with RW)        Lateral/Scoot Transfers: Min assist;+2 physical assistance      Balance Overall balance assessment: Needs assistance Sitting-balance support: Feet supported;Bilateral upper extremity supported Sitting balance-Leahy Scale: Poor     Standing balance support: Bilateral upper extremity supported Standing balance-Leahy Scale: Poor Standing balance comment: requires B UE support on RW                           ADL either performed or assessed with clinical judgement   ADL Overall ADL's : Needs assistance/impaired                     Lower Body Dressing: Total assistance;Sit to/from stand Lower Body Dressing Details (indicate cue type and reason): unable to pull sock up in long leg sitting in recliner Toilet Transfer: Requires drop arm;Minimal assistance;+2 for physical assistance Toilet Transfer Details (indicate cue type and reason): simulated to drop arm recliner          Functional mobility during ADLs: Cueing for sequencing;Cueing for safety;Minimal assistance General ADL Comments: pt limited by generalized weakness, decreased activity tolerance and impaired balance, verbal cues for sequencing and safety awareness  Vision       Perception     Praxis      Cognition Arousal/Alertness: Awake/alert Behavior During Therapy: WFL for tasks assessed/performed Overall Cognitive Status: Impaired/Different from baseline Area of Impairment: Problem solving                     Memory: Decreased short-term memory Following Commands: Follows one step  commands consistently;Follows one step commands with increased time     Problem Solving: Slow processing;Requires verbal cues;Requires tactile cues General Comments: Slow processing/response time to commands; tactile cues and multiple demos of BUE HEP        Exercises General Exercises - Upper Extremity Shoulder Flexion: AROM;Both;5 reps;Theraband;Seated Theraband Level (Shoulder Flexion): Level 2 (Red) Shoulder Extension: AROM;Both;5 reps;Seated;Theraband Theraband Level (Shoulder Extension): Level 2 (Red) Elbow Flexion: AROM;Both;5 reps;Seated;Theraband Theraband Level (Elbow Flexion): Level 2 (Red) Elbow Extension: AROM;Both;5 reps;Seated;Theraband Theraband Level (Elbow Extension): Level 2 (Red) Chair Push Up: AROM;Both;5 reps   Shoulder Instructions       General Comments      Pertinent Vitals/ Pain       Pain Assessment: No/denies pain Faces Pain Scale: Hurts a little bit Pain Location: right residual limb Pain Descriptors / Indicators: Operative site guarding;Sore Pain Intervention(s): Repositioned;Monitored during session  Home Living                                          Prior Functioning/Environment              Frequency  Min 2X/week        Progress Toward Goals  OT Goals(current goals can now be found in the care plan section)  Progress towards OT goals: Progressing toward goals  Acute Rehab OT Goals Time For Goal Achievement: 09/15/18 Potential to Achieve Goals: Good  Plan Discharge plan remains appropriate;Frequency remains appropriate    Co-evaluation    PT/OT/SLP Co-Evaluation/Treatment: Yes Reason for Co-Treatment: For patient/therapist safety;To address functional/ADL transfers   OT goals addressed during session: Strengthening/ROM;ADL's and self-care      AM-PAC OT "6 Clicks" Daily Activity     Outcome Measure   Help from another person eating meals?: A Little Help from another person taking care of  personal grooming?: A Little Help from another person toileting, which includes using toliet, bedpan, or urinal?: A Lot Help from another person bathing (including washing, rinsing, drying)?: A Lot Help from another person to put on and taking off regular upper body clothing?: A Little Help from another person to put on and taking off regular lower body clothing?: A Lot 6 Click Score: 15    End of Session Equipment Utilized During Treatment: Gait belt;Rolling walker  OT Visit Diagnosis: Other abnormalities of gait and mobility (R26.89);Muscle weakness (generalized) (M62.81);Pain   Activity Tolerance Patient tolerated treatment well   Patient Left in chair;with call bell/phone within reach   Nurse Communication Mobility status        Time: 4008-6761 OT Time Calculation (min): 27 min  Charges: OT General Charges $OT Visit: 1 Visit OT Treatments $Therapeutic Exercise: 8-22 mins     Ihor Gully, COTA/L 09/04/2018, 2:22 PM  [phone]

## 2018-09-04 NOTE — Progress Notes (Signed)
Subjective: 7 Days Post-Op Procedure(s) (LRB): RIGHT BELOW KNEE AMPUTATION (Right) Patient reports pain as mild.    Objective: Vital signs in last 24 hours: Temp:  [97.8 F (36.6 C)-98.2 F (36.8 C)] 98.2 F (36.8 C) (08/21 0327) Pulse Rate:  [60-63] 60 (08/21 0830) Resp:  [11-18] 11 (08/21 0830) BP: (127-157)/(56-71) 157/71 (08/21 0830) SpO2:  [96 %-100 %] 98 % (08/21 0830) Weight:  [110 kg] 110 kg (08/21 0327)  Intake/Output from previous day: 08/20 0701 - 08/21 0700 In: 323 [P.O.:320; I.V.:3] Out: 750 [Urine:750] Intake/Output this shift: No intake/output data recorded.  No results for input(s): HGB in the last 72 hours. No results for input(s): WBC, RBC, HCT, PLT in the last 72 hours. Recent Labs    09/03/18 0445 09/04/18 0314  NA 129* 128*  K 4.4 4.3  CL 97* 97*  CO2 23 23  BUN 22 21  CREATININE 1.14 1.09  GLUCOSE 138* 174*  CALCIUM 8.2* 7.7*   No results for input(s): LABPT, INR in the last 72 hours.  VAC dressing and shrinker and limb protector in place over the right transtibial amputation. No drainage in VAC canister.    Assessment/Plan: 7 Days Post-Op Procedure(s) (LRB): RIGHT BELOW KNEE AMPUTATION (Right) S/P Right transtibial amputation- POD#7- continue VAC therapy and DC with PRevena VAC if goes to CIR or SNF-rehab    [facility], PA-C 09/04/2018, 9:05 AM  [facility] [phone]

## 2018-09-05 LAB — GLUCOSE, CAPILLARY
Glucose-Capillary: 118 mg/dL — ABNORMAL HIGH (ref 70–99)
Glucose-Capillary: 148 mg/dL — ABNORMAL HIGH (ref 70–99)
Glucose-Capillary: 154 mg/dL — ABNORMAL HIGH (ref 70–99)
Glucose-Capillary: 177 mg/dL — ABNORMAL HIGH (ref 70–99)

## 2018-09-05 LAB — CREATININE, SERUM
Creatinine, Ser: 1.02 mg/dL (ref 0.61–1.24)
GFR calc Af Amer: >60 mL/min (ref >60)
GFR calc non Af Amer: >60 mL/min (ref >60)

## 2018-09-05 NOTE — Progress Notes (Signed)
PROGRESS NOTE  [name] ZOX:[id number] DOB: [birth year] DOA: 08/20/2018 PCP: [name], MD  Brief History   85 year old male with history of hypertension, hyperlipidemia, peripheral vascular disease, underwent stent placement to left SFA in 2012.  Patient developed infection after toenail was clipped in April 2020 requiring multiple rounds of antibiotics also worsening of the lower extremity edema.  Ultrasound showed ABI of 0.43 on the right with 0 vessel runoff.  Underwent peripheral angiography on 07/27/2018 by Dr. Alvester Chou revealed mid to distal left SFA stent patent, high-grade calcified disease under 0 vessel runoff on the left as well as on the right with a 99% calcified lesions in the distal right SFA/above-knee popliteal artery.  Underwent angioplasty followed by drug-coated balloon angioplasty to the distal right SFA.  Scented to emergency department on 08/20/2018 with worsening of the pain over the last 1 month with a discoloration of the foot.  Patient was initiated on broad-spectrum antibiotics in the emergency department.  Patient's initial lab work-up showed sodium of 124, creatinine of 1.4.  Patient underwent right lower extremity angiography with angioplasty on 08/25/2018.  Postprocedure patient was noted to have a hemoglobin of 7.3, received 2 units of packed RBC.  The patient's insurance company has denied inpatient rehab for the patient. I have concluded a peer-to-peer with them. As they have persisted in their denial, the patient will be presented in an expedited appeal process. If he is ultimately denied, he will need to go to SNF.  Consultants  . Orthopedic surgery . Palliative care  Procedures  . Trans-tibial amputation of the Right lower extremity  Antibiotics   Anti-infectives (From admission, onward)   Start     Dose/Rate Route Frequency Ordered Stop   08/28/18 1330  ceFAZolin (ANCEF) IVPB 1 g/50 mL premix     1 g 100 mL/hr over 30 Minutes Intravenous Every 6 hours  08/28/18 1019 08/29/18 0308   08/28/18 0745  clindamycin (CLEOCIN) IVPB 900 mg     900 mg 100 mL/hr over 30 Minutes Intravenous To ShortStay Surgical 08/28/18 0744 08/28/18 0820   08/28/18 0600  ceFAZolin (ANCEF) IVPB 2g/100 mL premix     2 g 200 mL/hr over 30 Minutes Intravenous To Short Stay 08/27/18 1921 08/28/18 0722   08/23/18 1800  ceFEPIme (MAXIPIME) 2 g in sodium chloride 0.9 % 100 mL IVPB  Status:  Discontinued     2 g 200 mL/hr over 30 Minutes Intravenous Every 12 hours 08/23/18 0954 08/24/18 1603   08/23/18 0930  clindamycin (CLEOCIN) IVPB 600 mg  Status:  Discontinued     600 mg 100 mL/hr over 30 Minutes Intravenous Every 8 hours 08/23/18 0925 08/24/18 1603   08/22/18 0845  ceFEPIme (MAXIPIME) 1 g in sodium chloride 0.9 % 100 mL IVPB  Status:  Discontinued     1 g 200 mL/hr over 30 Minutes Intravenous Every 8 hours 08/22/18 0840 08/23/18 0954   08/21/18 2200  vancomycin (VANCOCIN) 1,250 mg in sodium chloride 0.9 % 250 mL IVPB  Status:  Discontinued     1,250 mg 166.7 mL/hr over 90 Minutes Intravenous Every 24 hours 08/20/18 2111 08/21/18 1005   08/21/18 2200  vancomycin (VANCOCIN) IVPB 1000 mg/200 mL premix  Status:  Discontinued     1,000 mg 200 mL/hr over 60 Minutes Intravenous Every 24 hours 08/21/18 1005 08/23/18 0925   08/21/18 0400  piperacillin-tazobactam (ZOSYN) IVPB 3.375 g  Status:  Discontinued     3.375 g 12.5 mL/hr over 240 Minutes Intravenous Every  8 hours 08/20/18 1808 08/22/18 0840   08/20/18 2200  vancomycin (VANCOCIN) 2,000 mg in sodium chloride 0.9 % 500 mL IVPB     2,000 mg 250 mL/hr over 120 Minutes Intravenous  Once 08/20/18 2051 08/21/18 0100   08/20/18 1815  piperacillin-tazobactam (ZOSYN) IVPB 3.375 g     3.375 g 12.5 mL/hr over 240 Minutes Intravenous  Once 08/20/18 1808 08/21/18 0024     .   Subjective  The patient is resting comfortably. No new complaints.  Objective   Vitals:  Vitals:   09/05/18 0800 09/05/18 1319  BP:  (!) 126/55   Pulse:  63  Resp: 11 12  Temp:  98.1 F (36.7 C)  SpO2:  96%    Exam:  Constitutional:  The patient is awake, alert, and oriented x 3. No acute distress. Respiratory:  . CTA bilaterally, no w/r/r.  . Respiratory effort normal. No retractions or accessory muscle use Cardiovascular:  . RRR, no m/r/g . No LE extremity edema   . Normal pedal pulses Abdomen:  . Abdomen appears normal; no tenderness or masses . No hernias . No HSM Musculoskeletal:  . Right lower extremity stump is bandaged and booted. . Left lower extremity demonstrates no cyanosis, clubbing, or edema. Skin:  . No rashes, lesions, ulcers . palpation of skin: no induration or nodules Neurologic:  . CN 2-12 intact . Sensation all 4 extremities intact Psychiatric:  . Mental status o Mood, affect appropriate o Orientation to person, place, time  . judgment and insight appear intact     I have personally reviewed the following:   Today's Data  . Vitals, Creatinine, GFR  Scheduled Meds: . atorvastatin  40 mg Oral QPC supper  . clopidogrel  75 mg Oral Daily  . docusate sodium  100 mg Oral BID  . dorzolamide-timolol  1 drop Both Eyes BID  . doxazosin  2 mg Oral QHS  . enoxaparin (LOVENOX) injection  40 mg Subcutaneous Q24H  . famotidine  20 mg Oral BID  . finasteride  5 mg Oral QPM  . insulin aspart  0-9 Units Subcutaneous TID WC  . insulin glargine  10 Units Subcutaneous Daily  . levothyroxine  50 mcg Oral Q0600  . polyethylene glycol  17 g Oral Daily  . pyridOXINE  100 mg Oral Daily  . sodium chloride flush  3 mL Intravenous Q12H  . tamsulosin  0.4 mg Oral BID  . vitamin C  500 mg Oral Daily  . vitamin E  400 Units Oral Daily   Continuous Infusions: . sodium chloride    . sodium chloride    . methocarbamol (ROBAXIN) IV      Principal Problem:   Ischemic foot Active Problems:   Essential hypertension   Aortic valve stenosis   Hypothyroidism   PAF (paroxysmal atrial fibrillation) (HCC)    Atrial fibrillation, chronic   Cellulitis of great toe of right foot   Type 2 diabetes mellitus with foot ulcer, without long-term current use of insulin (HCC)   Type 2 diabetes mellitus with foot ulcer and gangrene (HCC)   Severe protein-calorie malnutrition (HCC)   Goals of care, counseling/discussion   Palliative care by specialist   DNR (do not resuscitate) discussion   Gangrene of right foot ([place])   LOS: 15 days    A & P  Right lower extremity cellulitis with dry gangrenous toes: S/P  transtibial amputation on 08/28/2018. Cefepime and clindamycin have been discontinued. His plavix has been discontinued.Orthopedic surgery is  following. Continue with wound VAC per Ortho recommendations. PT has recommended inpatient rehab primarily because of the changes to the patient's center of gravity following his amputation. They feel that the intensive therapy to help him deal with this change will have a significant effect on his success with mobility in the future. They feel that the patient is very motivated and they feel that he is able and willing to participate in the intense therapy that the inpatient rehab environment would provide. Unfortunately insurance has denied access to inpatient rehab. Social work/case management had given my number to the insurance company to set up a peer to peer.I discussed the patient with them at length today and they have upheld their denial. The patient will undergo an expedited denial process. Should this fail, he will have to go to SNF.  Peripheral vascular disease: S/p PTCA in 2012. Underwent angiogram, angioplasty on 07/27/2018 by Dr. Alvester Chou, has been compliant with medications. Admitted with gangrenous toes. Underwent angiogram on 08/25/2018 with angioplasty of anterior tibial artery with balloon angioplasty, post to revascularization appeared to be good inline flow all the way to the toes. Continue with DAPT with aspirin, Plavix and statin.  Anemia acute blood  loss: Hemoglobin trended down to 7.3 Received 2 units of packed RBC on 08/25/2011. Hemoglobin has remained stable since then. Continue IV iron.  Encephalopathy metabolic/toxic: Resolved.  Hypertension.  Blood pressure very well controlled. Continue with home medications.  Hypothyroidism: Continue with Synthroid.  Acute on chronic kidney disease stage III: Stable.  Aortic valve stenosis: Patient has moderate stenosis  Diabetes mellitus type 2: Blood sugar elevated.  Will start on Lantus 10 units and continue SSI.  Constipation: Resolved. Continue stool softeners, laxatives.  Hyponatraemia: Resolved. Sodium 130 and stable.   Debility: Evaluated by PT, recommended inpatient rehab. Pending insurance approval to be DCed to CIR.  I have seen and examined this patient myself. I have spent 45 minutes in his evaluation and care. More than 50% of this was spent in counseling with the patient and family and in discussing the patient with insurance peer-to peer.  DVT prophylaxis: Anticoagulation as above Code Status: Full code Family / Patient Communication: I have discussed the patient with his daughter who is at bedside at length. All questions answered to the best of my ability. Disposition Plan: Inpatient rehab  Celine Dishman, DO [facility] Direct contact: see [email or web address]  7PM-7AM contact night coverage as above 09/05/2018, 5:28 PM  LOS: 12 days

## 2018-09-05 NOTE — Plan of Care (Signed)
  Problem: Clinical Measurements: Goal: Will remain free from infection Outcome: Progressin  Problem: Activity: Goal: Risk for activity intolerance will decrease Outcome: Progressing   Problem: Nutrition: Goal: Adequate nutrition will be maintained Outcome: Progressing

## 2018-09-06 LAB — CREATININE, SERUM
Creatinine, Ser: 1.1 mg/dL (ref 0.61–1.24)
GFR calc Af Amer: >60 mL/min (ref >60)
GFR calc non Af Amer: 59 mL/min — ABNORMAL LOW (ref >60)

## 2018-09-06 LAB — GLUCOSE, CAPILLARY
Glucose-Capillary: 110 mg/dL — ABNORMAL HIGH (ref 70–99)
Glucose-Capillary: 154 mg/dL — ABNORMAL HIGH (ref 70–99)
Glucose-Capillary: 147 mg/dL — ABNORMAL HIGH (ref 70–99)
Glucose-Capillary: 164 mg/dL — ABNORMAL HIGH (ref 70–99)

## 2018-09-06 NOTE — Progress Notes (Signed)
PROGRESS NOTE  [name] DJT:[id number] DOB: Oct 23, 1927 DOA: 08/20/2018 PCP: [name], MD  Brief History   85 year old male with history of hypertension, hyperlipidemia, peripheral vascular disease, underwent stent placement to left SFA in 2012.  Patient developed infection after toenail was clipped in April 2020 requiring multiple rounds of antibiotics also worsening of the lower extremity edema.  Ultrasound showed ABI of 0.43 on the right with 0 vessel runoff.  Underwent peripheral angiography on 07/27/2018 by Dr. Alvester Chou revealed mid to distal left SFA stent patent, high-grade calcified disease under 0 vessel runoff on the left as well as on the right with a 99% calcified lesions in the distal right SFA/above-knee popliteal artery.  Underwent angioplasty followed by drug-coated balloon angioplasty to the distal right SFA.  Scented to emergency department on 08/20/2018 with worsening of the pain over the last 1 month with a discoloration of the foot.  Patient was initiated on broad-spectrum antibiotics in the emergency department.  Patient's initial lab work-up showed sodium of 124, creatinine of 1.4.  Patient underwent right lower extremity angiography with angioplasty on 08/25/2018.  Postprocedure patient was noted to have a hemoglobin of 7.3, received 2 units of packed RBC.  The patient's insurance company has denied inpatient rehab for the patient. I have concluded a peer-to-peer with them. As they have persisted in their denial, the patient will be presented in an expedited appeal process. If he is ultimately denied, he will need to go to SNF.  Consultants  . Orthopedic surgery . Palliative care  Procedures  . Trans-tibial amputation of the Right lower extremity  Antibiotics   Anti-infectives (From admission, onward)   Start     Dose/Rate Route Frequency Ordered Stop   08/28/18 1330  ceFAZolin (ANCEF) IVPB 1 g/50 mL premix     1 g 100 mL/hr over 30 Minutes Intravenous Every 6 hours  08/28/18 1019 08/29/18 0308   08/28/18 0745  clindamycin (CLEOCIN) IVPB 900 mg     900 mg 100 mL/hr over 30 Minutes Intravenous To ShortStay Surgical 08/28/18 0744 08/28/18 0820   08/28/18 0600  ceFAZolin (ANCEF) IVPB 2g/100 mL premix     2 g 200 mL/hr over 30 Minutes Intravenous To Short Stay 08/27/18 1921 08/28/18 0722   08/23/18 1800  ceFEPIme (MAXIPIME) 2 g in sodium chloride 0.9 % 100 mL IVPB  Status:  Discontinued     2 g 200 mL/hr over 30 Minutes Intravenous Every 12 hours 08/23/18 0954 08/24/18 1603   08/23/18 0930  clindamycin (CLEOCIN) IVPB 600 mg  Status:  Discontinued     600 mg 100 mL/hr over 30 Minutes Intravenous Every 8 hours 08/23/18 0925 08/24/18 1603   08/22/18 0845  ceFEPIme (MAXIPIME) 1 g in sodium chloride 0.9 % 100 mL IVPB  Status:  Discontinued     1 g 200 mL/hr over 30 Minutes Intravenous Every 8 hours 08/22/18 0840 08/23/18 0954   08/21/18 2200  vancomycin (VANCOCIN) 1,250 mg in sodium chloride 0.9 % 250 mL IVPB  Status:  Discontinued     1,250 mg 166.7 mL/hr over 90 Minutes Intravenous Every 24 hours 08/20/18 2111 08/21/18 1005   08/21/18 2200  vancomycin (VANCOCIN) IVPB 1000 mg/200 mL premix  Status:  Discontinued     1,000 mg 200 mL/hr over 60 Minutes Intravenous Every 24 hours 08/21/18 1005 08/23/18 0925   08/21/18 0400  piperacillin-tazobactam (ZOSYN) IVPB 3.375 g  Status:  Discontinued     3.375 g 12.5 mL/hr over 240 Minutes Intravenous Every  8 hours 08/20/18 1808 08/22/18 0840   08/20/18 2200  vancomycin (VANCOCIN) 2,000 mg in sodium chloride 0.9 % 500 mL IVPB     2,000 mg 250 mL/hr over 120 Minutes Intravenous  Once 08/20/18 2051 08/21/18 0100   08/20/18 1815  piperacillin-tazobactam (ZOSYN) IVPB 3.375 g     3.375 g 12.5 mL/hr over 240 Minutes Intravenous  Once 08/20/18 1808 08/21/18 0024     .   Subjective  The patient is resting comfortably. No new complaints.  Objective   Vitals:  Vitals:   09/06/18 0457 09/06/18 1444  BP: 136/60 (!)  126/49  Pulse: (!) 54 (!) 55  Resp: 20 12  Temp: 97.8 F (36.6 C)   SpO2: 98% 98%    Exam:  Constitutional:  The patient is awake, alert, and oriented x 3. No acute distress. Respiratory:  . CTA bilaterally, no w/r/r.  . Respiratory effort normal. No retractions or accessory muscle use Cardiovascular:  . RRR, no m/r/g . No LE extremity edema   . Normal pedal pulses Abdomen:  . Abdomen appears normal; no tenderness or masses . No hernias . No HSM Musculoskeletal:  . Right lower extremity stump is bandaged and booted. . Left lower extremity demonstrates no cyanosis, clubbing, or edema. Skin:  . No rashes, lesions, ulcers . palpation of skin: no induration or nodules Neurologic:  . CN 2-12 intact . Sensation all 4 extremities intact Psychiatric:  . Mental status o Mood, affect appropriate o Orientation to person, place, time  . judgment and insight appear intact     I have personally reviewed the following:   Today's Data  . Vitals, Creatinine, GFR  Scheduled Meds: . atorvastatin  40 mg Oral QPC supper  . clopidogrel  75 mg Oral Daily  . docusate sodium  100 mg Oral BID  . dorzolamide-timolol  1 drop Both Eyes BID  . doxazosin  2 mg Oral QHS  . enoxaparin (LOVENOX) injection  40 mg Subcutaneous Q24H  . famotidine  20 mg Oral BID  . finasteride  5 mg Oral QPM  . insulin aspart  0-9 Units Subcutaneous TID WC  . insulin glargine  10 Units Subcutaneous Daily  . levothyroxine  50 mcg Oral Q0600  . polyethylene glycol  17 g Oral Daily  . pyridOXINE  100 mg Oral Daily  . sodium chloride flush  3 mL Intravenous Q12H  . tamsulosin  0.4 mg Oral BID  . vitamin C  500 mg Oral Daily  . vitamin E  400 Units Oral Daily   Continuous Infusions: . sodium chloride    . sodium chloride    . methocarbamol (ROBAXIN) IV      Principal Problem:   Ischemic foot Active Problems:   Essential hypertension   Aortic valve stenosis   Hypothyroidism   PAF (paroxysmal atrial  fibrillation) (HCC)   Atrial fibrillation, chronic   Cellulitis of great toe of right foot   Type 2 diabetes mellitus with foot ulcer, without long-term current use of insulin (HCC)   Type 2 diabetes mellitus with foot ulcer and gangrene (HCC)   Severe protein-calorie malnutrition (HCC)   Goals of care, counseling/discussion   Palliative care by specialist   DNR (do not resuscitate) discussion   Gangrene of right foot ([facility])   LOS: 16 days    A & P  Right lower extremity cellulitis with dry gangrenous toes: S/P  transtibial amputation on 08/28/2018. Cefepime and clindamycin have been discontinued. His plavix has been discontinued.Orthopedic  surgery is following. Continue with wound VAC per Ortho recommendations. PT has recommended inpatient rehab primarily because of the changes to the patient's center of gravity following his amputation. They feel that the intensive therapy to help him deal with this change will have a significant effect on his success with mobility in the future. They feel that the patient is very motivated and they feel that he is Johnston and willing to participate in the intense therapy that the inpatient rehab environment would provide. Unfortunately insurance has denied access to inpatient rehab. Social work/case management had given my number to the insurance company to set up a peer to peer.I discussed the patient with them at length today and they have upheld their denial. The patient will undergo an expedited denial process. Should this fail, he will have to go to SNF.  Peripheral vascular disease: S/p PTCA in 2012. Underwent angiogram, angioplasty on 07/27/2018 by Dr. Alvester Chou, has been compliant with medications. Admitted with gangrenous toes. Underwent angiogram on 08/25/2018 with angioplasty of anterior tibial artery with balloon angioplasty, post to revascularization appeared to be good inline flow all the way to the toes. Continue with DAPT with aspirin, Plavix and statin.   Anemia acute blood loss: Hemoglobin trended down to 7.3 Received 2 units of packed RBC on 08/25/2011. Hemoglobin has remained stable since then. Continue IV iron.  Encephalopathy metabolic/toxic: Resolved.  Hypertension.  Blood pressure very well controlled. Continue with home medications.  Hypothyroidism: Continue with Synthroid.  Acute on chronic kidney disease stage III: Stable.  Aortic valve stenosis: Patient has moderate stenosis  Diabetes mellitus type 2: Blood sugar elevated.  Will start on Lantus 10 units and continue SSI.  Constipation: Resolved. Continue stool softeners, laxatives.  Hyponatraemia: Resolved. Sodium 130 and stable.   Debility: Evaluated by PT, recommended inpatient rehab. Pending insurance approval to be DCed to CIR.  I have seen and examined this patient myself. I have spent 45 minutes in his evaluation and care. More than 50% of this was spent in counseling with the patient and family and in discussing the patient with insurance peer-to peer.  DVT prophylaxis: Anticoagulation as above Code Status: Full code Family / Patient Communication: I have discussed the patient with his younger son who is at bedside at length. All questions answered to the best of my ability. Disposition Plan: Inpatient rehab  Travis Able, DO [facility] Direct contact: see [email or web address]  7PM-7AM contact night coverage as above 09/06/2018, 5:57 PM  LOS: 12 days

## 2018-09-07 ENCOUNTER — Other Ambulatory Visit: Payer: Self-pay

## 2018-09-07 ENCOUNTER — Inpatient Hospital Stay (HOSPITAL_COMMUNITY)
Admission: RE | Admit: 2018-09-07 | Discharge: 2018-09-22 | DRG: 560 | Disposition: A | Discharge status: Home Health | Payer: Medicare Other | Source: Intra-hospital | Attending: Physical Medicine and Rehabilitation | Admitting: Physical Medicine and Rehabilitation

## 2018-09-07 DIAGNOSIS — N183 Chronic kidney disease, stage 3 (moderate): Secondary | ICD-10-CM | POA: Diagnosis not present

## 2018-09-07 DIAGNOSIS — I739 Peripheral vascular disease, unspecified: Secondary | ICD-10-CM | POA: Diagnosis not present

## 2018-09-07 DIAGNOSIS — Z7982 Long term (current) use of aspirin: Secondary | ICD-10-CM

## 2018-09-07 DIAGNOSIS — M79672 Pain in left foot: Secondary | ICD-10-CM | POA: Diagnosis not present

## 2018-09-07 DIAGNOSIS — Z4781 Encounter for orthopedic aftercare following surgical amputation: Principal | ICD-10-CM

## 2018-09-07 DIAGNOSIS — N400 Enlarged prostate without lower urinary tract symptoms: Secondary | ICD-10-CM | POA: Diagnosis present

## 2018-09-07 DIAGNOSIS — Z7902 Long term (current) use of antithrombotics/antiplatelets: Secondary | ICD-10-CM

## 2018-09-07 DIAGNOSIS — E039 Hypothyroidism, unspecified: Secondary | ICD-10-CM | POA: Diagnosis present

## 2018-09-07 DIAGNOSIS — D62 Acute posthemorrhagic anemia: Secondary | ICD-10-CM | POA: Diagnosis present

## 2018-09-07 DIAGNOSIS — H9190 Unspecified hearing loss, unspecified ear: Secondary | ICD-10-CM | POA: Diagnosis not present

## 2018-09-07 DIAGNOSIS — R2689 Other abnormalities of gait and mobility: Secondary | ICD-10-CM | POA: Diagnosis not present

## 2018-09-07 DIAGNOSIS — Z79899 Other long term (current) drug therapy: Secondary | ICD-10-CM

## 2018-09-07 DIAGNOSIS — Z79890 Hormone replacement therapy (postmenopausal): Secondary | ICD-10-CM | POA: Diagnosis not present

## 2018-09-07 DIAGNOSIS — G8918 Other acute postprocedural pain: Secondary | ICD-10-CM

## 2018-09-07 DIAGNOSIS — K5900 Constipation, unspecified: Secondary | ICD-10-CM | POA: Diagnosis present

## 2018-09-07 DIAGNOSIS — E785 Hyperlipidemia, unspecified: Secondary | ICD-10-CM | POA: Diagnosis not present

## 2018-09-07 DIAGNOSIS — N184 Chronic kidney disease, stage 4 (severe): Secondary | ICD-10-CM | POA: Diagnosis present

## 2018-09-07 DIAGNOSIS — I129 Hypertensive chronic kidney disease with stage 1 through stage 4 chronic kidney disease, or unspecified chronic kidney disease: Secondary | ICD-10-CM | POA: Diagnosis present

## 2018-09-07 DIAGNOSIS — E1122 Type 2 diabetes mellitus with diabetic chronic kidney disease: Secondary | ICD-10-CM | POA: Diagnosis present

## 2018-09-07 DIAGNOSIS — E1152 Type 2 diabetes mellitus with diabetic peripheral angiopathy with gangrene: Secondary | ICD-10-CM | POA: Diagnosis not present

## 2018-09-07 DIAGNOSIS — I10 Essential (primary) hypertension: Secondary | ICD-10-CM

## 2018-09-07 DIAGNOSIS — E871 Hypo-osmolality and hyponatremia: Secondary | ICD-10-CM

## 2018-09-07 DIAGNOSIS — Z89511 Acquired absence of right leg below knee: Secondary | ICD-10-CM | POA: Diagnosis not present

## 2018-09-07 DIAGNOSIS — E119 Type 2 diabetes mellitus without complications: Secondary | ICD-10-CM

## 2018-09-07 DIAGNOSIS — F515 Nightmare disorder: Secondary | ICD-10-CM | POA: Diagnosis not present

## 2018-09-07 DIAGNOSIS — H409 Unspecified glaucoma: Secondary | ICD-10-CM | POA: Diagnosis present

## 2018-09-07 DIAGNOSIS — Z87891 Personal history of nicotine dependence: Secondary | ICD-10-CM

## 2018-09-07 DIAGNOSIS — Z7984 Long term (current) use of oral hypoglycemic drugs: Secondary | ICD-10-CM

## 2018-09-07 DIAGNOSIS — Z8249 Family history of ischemic heart disease and other diseases of the circulatory system: Secondary | ICD-10-CM | POA: Diagnosis not present

## 2018-09-07 DIAGNOSIS — Z7901 Long term (current) use of anticoagulants: Secondary | ICD-10-CM | POA: Diagnosis not present

## 2018-09-07 DIAGNOSIS — Z91041 Radiographic dye allergy status: Secondary | ICD-10-CM | POA: Diagnosis not present

## 2018-09-07 DIAGNOSIS — G4730 Sleep apnea, unspecified: Secondary | ICD-10-CM | POA: Diagnosis not present

## 2018-09-07 DIAGNOSIS — S90822A Blister (nonthermal), left foot, initial encounter: Secondary | ICD-10-CM | POA: Diagnosis not present

## 2018-09-07 LAB — CBC
HCT: 29.5 % — ABNORMAL LOW (ref 39.0–52.0)
Hemoglobin: 9.4 g/dL — ABNORMAL LOW (ref 13.0–17.0)
MCH: 31.3 pg (ref 26.0–34.0)
MCHC: 31.9 g/dL (ref 30.0–36.0)
MCV: 98.3 fL (ref 80.0–100.0)
Platelets: 268 10*3/uL (ref 150–400)
RBC: 3 MIL/uL — ABNORMAL LOW (ref 4.22–5.81)
RDW: 17.7 % — ABNORMAL HIGH (ref 11.5–15.5)
WBC: 9.4 10*3/uL (ref 4.0–10.5)
nRBC: 0 % (ref 0.0–0.2)

## 2018-09-07 LAB — GLUCOSE, CAPILLARY
Glucose-Capillary: 128 mg/dL — ABNORMAL HIGH (ref 70–99)
Glucose-Capillary: 157 mg/dL — ABNORMAL HIGH (ref 70–99)
Glucose-Capillary: 155 mg/dL — ABNORMAL HIGH (ref 70–99)
Glucose-Capillary: 183 mg/dL — ABNORMAL HIGH (ref 70–99)

## 2018-09-07 LAB — CREATININE, SERUM
Creatinine, Ser: 1.13 mg/dL (ref 0.61–1.24)
GFR calc Af Amer: >60 mL/min (ref >60)
GFR calc non Af Amer: 57 mL/min — ABNORMAL LOW (ref >60)

## 2018-09-07 LAB — BASIC METABOLIC PANEL
Anion gap: 11 (ref 5–15)
BUN: 19 mg/dL (ref 8–23)
CO2: 19 mmol/L — ABNORMAL LOW (ref 22–32)
Calcium: 8.3 mg/dL — ABNORMAL LOW (ref 8.9–10.3)
Chloride: 100 mmol/L (ref 98–111)
Creatinine, Ser: 1.12 mg/dL (ref 0.61–1.24)
GFR calc Af Amer: >60 mL/min (ref >60)
GFR calc non Af Amer: 58 mL/min — ABNORMAL LOW (ref >60)
Glucose, Bld: 132 mg/dL — ABNORMAL HIGH (ref 70–99)
Potassium: 4.3 mmol/L (ref 3.5–5.1)
Sodium: 130 mmol/L — ABNORMAL LOW (ref 135–145)

## 2018-09-07 MED ORDER — INSULIN GLARGINE 100 UNIT/ML ~~LOC~~ SOLN
10.0000 [IU] | Freq: Every day | SUBCUTANEOUS | Status: DC
  Administered 2018-09-08 – 2018-09-22 (×15): 10 [IU] via SUBCUTANEOUS
  Filled 2018-09-07 (×16): qty 0.1

## 2018-09-07 MED ORDER — FINASTERIDE 5 MG PO TABS
5.0000 mg | ORAL_TABLET | Freq: Every evening | ORAL | Status: DC
  Administered 2018-09-07 – 2018-09-21 (×15): 5 mg via ORAL
  Filled 2018-09-07 (×15): qty 1

## 2018-09-07 MED ORDER — ONDANSETRON HCL 4 MG PO TABS: 4.0000 mg | ORAL_TABLET | Freq: Four times a day (QID) | ORAL | Status: DC | PRN

## 2018-09-07 MED ORDER — ENOXAPARIN SODIUM 40 MG/0.4ML ~~LOC~~ SOLN: 40.0000 mg | SUBCUTANEOUS | Status: DC

## 2018-09-07 MED ORDER — POLYETHYLENE GLYCOL 3350 17 G PO PACK: 17.0000 g | PACK | Freq: Every day | ORAL | 0 refills | Status: DC

## 2018-09-07 MED ORDER — VITAMIN E 180 MG (400 UNIT) PO CAPS
400.0000 [IU] | ORAL_CAPSULE | Freq: Every day | ORAL | Status: DC
  Administered 2018-09-08 – 2018-09-22 (×15): 400 [IU] via ORAL
  Filled 2018-09-07 (×16): qty 1

## 2018-09-07 MED ORDER — ACETAMINOPHEN 325 MG PO TABS
325.0000 mg | ORAL_TABLET | Freq: Four times a day (QID) | ORAL | Status: DC | PRN
  Administered 2018-09-07 – 2018-09-22 (×8): 650 mg via ORAL
  Filled 2018-09-07 (×8): qty 2

## 2018-09-07 MED ORDER — TAMSULOSIN HCL 0.4 MG PO CAPS
0.4000 mg | ORAL_CAPSULE | Freq: Two times a day (BID) | ORAL | Status: DC
  Administered 2018-09-07 – 2018-09-22 (×30): 0.4 mg via ORAL
  Filled 2018-09-07 (×30): qty 1

## 2018-09-07 MED ORDER — FAMOTIDINE 20 MG PO TABS
20.0000 mg | ORAL_TABLET | Freq: Two times a day (BID) | ORAL | Status: DC
  Administered 2018-09-07 – 2018-09-22 (×30): 20 mg via ORAL
  Filled 2018-09-07 (×30): qty 1

## 2018-09-07 MED ORDER — VITAMIN C 500 MG PO TABS
500.0000 mg | ORAL_TABLET | Freq: Every day | ORAL | Status: DC
  Administered 2018-09-08 – 2018-09-22 (×15): 500 mg via ORAL
  Filled 2018-09-07 (×15): qty 1

## 2018-09-07 MED ORDER — METHOCARBAMOL 500 MG PO TABS: 500.0000 mg | ORAL_TABLET | Freq: Four times a day (QID) | ORAL | Status: DC | PRN

## 2018-09-07 MED ORDER — ALBUTEROL SULFATE (2.5 MG/3ML) 0.083% IN NEBU: 2.5000 mg | INHALATION_SOLUTION | Freq: Four times a day (QID) | RESPIRATORY_TRACT | 12 refills | Status: DC | PRN

## 2018-09-07 MED ORDER — POLYETHYLENE GLYCOL 3350 17 G PO PACK: 17.0000 g | PACK | Freq: Every day | ORAL | Status: DC | PRN

## 2018-09-07 MED ORDER — OXYCODONE-ACETAMINOPHEN 5-325 MG PO TABS: 1.0000 | ORAL_TABLET | Freq: Three times a day (TID) | ORAL | Status: DC | PRN

## 2018-09-07 MED ORDER — LEVOTHYROXINE SODIUM 25 MCG PO TABS
50.0000 ug | ORAL_TABLET | Freq: Every day | ORAL | Status: DC
  Administered 2018-09-08 – 2018-09-22 (×15): 50 ug via ORAL
  Filled 2018-09-07 (×8): qty 2
  Filled 2018-09-07: qty 1
  Filled 2018-09-07 (×5): qty 2
  Filled 2018-09-07: qty 1

## 2018-09-07 MED ORDER — DOXAZOSIN MESYLATE 2 MG PO TABS
2.0000 mg | ORAL_TABLET | Freq: Every day | ORAL | Status: DC
  Administered 2018-09-07 – 2018-09-21 (×15): 2 mg via ORAL
  Filled 2018-09-07 (×15): qty 1

## 2018-09-07 MED ORDER — ASPIRIN EC 81 MG PO TBEC: 81.0000 mg | DELAYED_RELEASE_TABLET | Freq: Every day | ORAL | Status: AC

## 2018-09-07 MED ORDER — ONDANSETRON HCL 4 MG/2ML IJ SOLN: 4.0000 mg | Freq: Four times a day (QID) | INTRAMUSCULAR | Status: DC | PRN

## 2018-09-07 MED ORDER — DORZOLAMIDE HCL-TIMOLOL MAL 2-0.5 % OP SOLN
1.0000 [drp] | Freq: Two times a day (BID) | OPHTHALMIC | Status: DC
  Administered 2018-09-07 – 2018-09-22 (×30): 1 [drp] via OPHTHALMIC
  Filled 2018-09-07: qty 10

## 2018-09-07 MED ORDER — SORBITOL 70 % SOLN: 30.0000 mL | Freq: Every day | Status: DC | PRN

## 2018-09-07 MED ORDER — VITAMIN B-6 100 MG PO TABS
100.0000 mg | ORAL_TABLET | Freq: Every day | ORAL | Status: DC
  Administered 2018-09-08 – 2018-09-22 (×15): 100 mg via ORAL
  Filled 2018-09-07 (×16): qty 1

## 2018-09-07 MED ORDER — CALCIUM CARBONATE ANTACID 500 MG PO CHEW: 1.0000 | CHEWABLE_TABLET | ORAL | Status: DC | PRN

## 2018-09-07 MED ORDER — INSULIN GLARGINE 100 UNIT/ML ~~LOC~~ SOLN: 10.0000 [IU] | Freq: Every day | SUBCUTANEOUS | 11 refills | Status: DC

## 2018-09-07 MED ORDER — ENOXAPARIN SODIUM 40 MG/0.4ML ~~LOC~~ SOLN
40.0000 mg | SUBCUTANEOUS | Status: DC
  Administered 2018-09-08 – 2018-09-21 (×14): 40 mg via SUBCUTANEOUS
  Filled 2018-09-07 (×14): qty 0.4

## 2018-09-07 MED ORDER — BISACODYL 10 MG RE SUPP: 10.0000 mg | Freq: Every day | RECTAL | Status: DC | PRN

## 2018-09-07 MED ORDER — ASPIRIN 81 MG PO TBEC: 81.0000 mg | DELAYED_RELEASE_TABLET | Freq: Every day | ORAL | 0 refills | Status: AC

## 2018-09-07 MED ORDER — CLOPIDOGREL BISULFATE 75 MG PO TABS
75.0000 mg | ORAL_TABLET | Freq: Every day | ORAL | Status: DC
  Administered 2018-09-08 – 2018-09-22 (×15): 75 mg via ORAL
  Filled 2018-09-07 (×15): qty 1

## 2018-09-07 MED ORDER — ALBUTEROL SULFATE (2.5 MG/3ML) 0.083% IN NEBU: 2.5000 mg | INHALATION_SOLUTION | Freq: Four times a day (QID) | RESPIRATORY_TRACT | Status: DC | PRN

## 2018-09-07 MED ORDER — INSULIN ASPART 100 UNIT/ML ~~LOC~~ SOLN
0.0000 [IU] | Freq: Three times a day (TID) | SUBCUTANEOUS | Status: DC
  Administered 2018-09-07: 2 [IU] via SUBCUTANEOUS
  Administered 2018-09-08: 1 [IU] via SUBCUTANEOUS
  Administered 2018-09-08: 2 [IU] via SUBCUTANEOUS
  Administered 2018-09-09: 1 [IU] via SUBCUTANEOUS
  Administered 2018-09-09: 2 [IU] via SUBCUTANEOUS
  Administered 2018-09-10 (×2): 1 [IU] via SUBCUTANEOUS
  Administered 2018-09-11: 2 [IU] via SUBCUTANEOUS
  Administered 2018-09-11 – 2018-09-12 (×3): 1 [IU] via SUBCUTANEOUS
  Administered 2018-09-13: 2 [IU] via SUBCUTANEOUS
  Administered 2018-09-13: 1 [IU] via SUBCUTANEOUS
  Administered 2018-09-14 – 2018-09-17 (×4): 2 [IU] via SUBCUTANEOUS
  Administered 2018-09-18: 3 [IU] via SUBCUTANEOUS
  Administered 2018-09-19 (×2): 1 [IU] via SUBCUTANEOUS
  Administered 2018-09-20 (×2): 2 [IU] via SUBCUTANEOUS
  Administered 2018-09-21: 1 [IU] via SUBCUTANEOUS
  Administered 2018-09-21: 3 [IU] via SUBCUTANEOUS

## 2018-09-07 MED ORDER — FAMOTIDINE 20 MG PO TABS: 20.0000 mg | ORAL_TABLET | Freq: Two times a day (BID) | ORAL | 0 refills | Status: DC

## 2018-09-07 MED ORDER — ATORVASTATIN CALCIUM 40 MG PO TABS
40.0000 mg | ORAL_TABLET | Freq: Every day | ORAL | Status: DC
  Administered 2018-09-07 – 2018-09-21 (×15): 40 mg via ORAL
  Filled 2018-09-07 (×15): qty 1

## 2018-09-07 MED ORDER — POLYETHYLENE GLYCOL 3350 17 G PO PACK
17.0000 g | PACK | Freq: Every day | ORAL | Status: DC
  Administered 2018-09-08 – 2018-09-22 (×9): 17 g via ORAL
  Filled 2018-09-07 (×14): qty 1

## 2018-09-07 MED ORDER — METHOCARBAMOL 1000 MG/10ML IJ SOLN
500.0000 mg | Freq: Four times a day (QID) | INTRAVENOUS | Status: DC | PRN
  Filled 2018-09-07: qty 5

## 2018-09-07 NOTE — Discharge Summary (Signed)
Physician Discharge Summary  [name] KPT:[id number] DOB: Nov 15, 1927 DOA: 08/20/2018  PCP: [name], MD  Admit date: 08/20/2018 Discharge date: 09/07/2018  Recommendations for Outpatient Follow-up:  1. PT/OT at inpatient rehab 2. Follow up with orthopedic surgery as directed 3. Follow up with PCP in 7-10 days after discharge from rehab.  Follow-up Information    [name], MD In 1 week.   Specialty: Orthopedic Surgery Contact information: [place] [address] [phone]            Discharge Diagnoses: Principal diagnosis is #1 Right lower extremity cellulitis with dry gangrene of the toes. S/P transtibial amputation. Peripheral vascular disease. S/P angiogram 08/25/2018 with angioplasty of the anterior tibial artery with balloon angioplasty. Acute anemia due to acute blood loss. The patient has received 2 units of packed RBC's on 08/25/2018. Iron deficiency: Pt has been suppemented. Toxic metabolic encephalopathy: Resolved. Hypertension Hypothyroidism Acute on chronic kidney disease stage III Aortic valve stenosis DM II Hyponatremia Debility  Discharge Condition: Fair Disposition: Inpatient rehab  Diet recommendation: heart healthy with carbohydrate control  Filed Weights   09/05/18 0515 09/06/18 0457 09/07/18 0513  Weight: 103 kg 100 kg 104 kg    History of present illness:  [name] is a 85 y.o. male with history of peripheral vascular disease who has recently underwent right lower extremity intervention for peripheral vascular disease presents to the ER because of worsening pain and redness of the right lower extremity.  Patient has been having a gangrenous toe which has been progressing getting worse over the last 1 month.  It has been progressing involving the whole toe now and progressing to the other part of the right foot.  Patient also noticed increasing redness involving the foot which has progressed to distal  right leg.  Over the last 2 weeks patient is not able ambulate because of the changes seen.  Denies any chest pain shortness of breath fever chills.  ED Course: In the ER on exam patient has poor pulses in the extremities.  X-ray of the right foot does not show anything acute.  On exam patient has gangrenous looking right great toe and also some darkish areas on the plantar aspect of the foot.  Erythema involving the foot and the right distal part of the leg.  ER physician had discussed with on-call cardiologist since patient had procedure done by cardiologist 2 weeks ago.  They will be seeing patient in consult.  Also discussed with Dr. Marlou Sa on-call orthopedic surgeon.  Patient admitted for further management of cellulitis and gangrene of the right foot.  On antibiotics.  Labs show worsening anemia for which stool for occult blood was positive.  Patient has not noticed any obvious bleeding.  Other labs include sodium 124 creatinine 1.4 was given some fluid in the ER.  Hospital Course:  85 year old male with history of hypertension, hyperlipidemia, peripheral vascular disease, underwent stent placement to left SFA in 2012. Patient developed infection after toenail was clipped in April 2020 requiring multiple rounds of antibiotics also worsening of the lower extremity edema. Ultrasound showed ABI of 0.43 on the right with 0 vessel runoff. Underwent peripheral angiography on 07/27/2018 by Dr. Alvester Chou revealed mid to distal left SFA stent patent, high-grade calcified disease under 0 vessel runoff on the left as well as on the right with a 99% calcified lesions in the distal right SFA/above-knee popliteal artery. Underwent angioplasty followed by drug-coated balloon angioplasty to the distal right SFA. Scented  to emergency department on 08/20/2018 with worsening of the pain over the last 1 month with a discoloration of the foot. Patient was initiated on broad-spectrum antibiotics in the emergency department.  Patient's initial lab work-up showed sodium of 124, creatinine of 1.4. Patient underwent right lower extremity angiography with angioplasty on 08/25/2018. Postprocedure patient was noted to have a hemoglobin of 7.3, received 2 units of packed RBC.  The patient's insurance company has cleared him for inpatient rehab. He will be discharged to there today.  Today's assessment: S: The patient is resting comfortably. No new complaints. O: Vitals:  Vitals:   09/06/18 2047 09/07/18 0513  BP: 134/70 (!) 143/64  Pulse: 62 60  Resp: 16   Temp: 97.9 F (36.6 C) 97.9 F (36.6 C)  SpO2: 97% 97%    Constitutional:  The patient is awake, alert, and oriented x 3. No acute distress. Respiratory:   CTA bilaterally, no w/r/r.   Respiratory effort normal. No retractions or accessory muscle use Cardiovascular:   RRR, no m/r/g  No LE extremity edema    Normal pedal pulses Abdomen:   Abdomen appears normal; no tenderness or masses  No hernias  No HSM Musculoskeletal:   Right lower extremity stump is bandaged and booted.  Left lower extremity demonstrates no cyanosis, clubbing, or edema. Skin:   No rashes, lesions, ulcers  palpation of skin: no induration or nodules Neurologic:   CN 2-12 intact  Sensation all 4 extremities intact Psychiatric:   Mental status ? Mood, affect appropriate ? Orientation to person, place, time   judgment and insight appear intact    Discharge Instructions  Discharge Instructions    Activity as tolerated - No restrictions   Complete by: As directed    Call MD for:  difficulty breathing, headache or visual disturbances   Complete by: As directed    Call MD for:  severe uncontrolled pain   Complete by: As directed    Call MD for:  temperature >100.4   Complete by: As directed    Diet - low sodium heart healthy   Complete by: As directed    With controlled carbohydrates.   Discharge instructions   Complete by: As directed    PT/OT while  in rehab Follow up with orthopedic surgery as directed. Follow up with PCP in 7-10 days after discharge.   Increase activity slowly   Complete by: As directed    Negative Pressure Wound Therapy - Incisional   Complete by: As directed    Attach wound VAC dressing to a portable Praveena wound VAC pump at discharge.  These pumps are available on 5 N. and 6 N. and in the operating room.     Allergies as of 09/07/2018      Reactions   Iodinated Diagnostic Agents    Other reaction(s): Fever   Iodine-131 Other (See Comments)   Breakout, fever   Iohexol     Code: RASH, Desc: PT STATES HOT ALL OVER HAD TO HAVE ICE PACKS ALL OVER HIM AND DIFF BREATHING, PT REFUSED IV DYE      Medication List    STOP taking these medications   apixaban 2.5 MG Tabs tablet Commonly known as: ELIQUIS   furosemide 40 MG tablet Commonly known as: LASIX   isosorbide mononitrate 30 MG 24 hr tablet Commonly known as: IMDUR   metoprolol tartrate 25 MG tablet Commonly known as: LOPRESSOR   pyridOXINE 100 MG tablet Commonly known as: VITAMIN B-6   ranitidine 150 MG  tablet Commonly known as: ZANTAC Replaced by: famotidine 20 MG tablet     TAKE these medications   albuterol (2.5 MG/3ML) 0.083% nebulizer solution Commonly known as: PROVENTIL Take 3 mLs (2.5 mg total) by nebulization every 6 (six) hours as needed for wheezing or shortness of breath.   aspirin 81 MG EC tablet Take 1 tablet (81 mg total) by mouth daily. Stop taking aspirin 09/04/2018.   atorvastatin 40 MG tablet Commonly known as: LIPITOR TAKE 1 TABLET BY MOUTH ONCE DAILY What changed: when to take this   calcium carbonate 500 MG chewable tablet Commonly known as: TUMS - dosed in mg elemental calcium Chew 1 tablet by mouth as needed for indigestion or heartburn.   clopidogrel 75 MG tablet Commonly known as: PLAVIX Take 1 tablet (75 mg total) by mouth daily with breakfast.   diphenhydrAMINE 50 MG tablet Commonly known as:  BENADRYL PLEASE FOLLOW THE INSTRUCTIONS ON YOUR AFTER VISIT SUMMARY   docusate sodium 100 MG capsule Commonly known as: COLACE Take 100 mg by mouth 2 (two) times daily as needed (for use with oxycodone to prevent constipation.).   dorzolamide-timolol 22.3-6.8 MG/ML ophthalmic solution Commonly known as: COSOPT Place 1 drop into both eyes 2 (two) times daily.   doxazosin 2 MG tablet Commonly known as: Cardura Take 1 tablet (2 mg total) by mouth at bedtime.   famotidine 20 MG tablet Commonly known as: PEPCID Take 1 tablet (20 mg total) by mouth 2 (two) times daily. Replaces: ranitidine 150 MG tablet   finasteride 5 MG tablet Commonly known as: PROSCAR Take 5 mg by mouth every evening.   FISH OIL PO Take 1 capsule by mouth daily.   glipiZIDE 5 MG tablet Commonly known as: GLUCOTROL Take 5 mg by mouth 2 (two) times daily.   insulin glargine 100 UNIT/ML injection Commonly known as: LANTUS Inject 0.1 mLs (10 Units total) into the skin daily. Start taking on: September 08, 2018   levothyroxine 50 MCG tablet Commonly known as: SYNTHROID Take 50 mcg by mouth daily before breakfast.   oxyCODONE-acetaminophen 5-325 MG tablet Commonly known as: Percocet Take 1-2 tablets by mouth every 8 (eight) hours as needed for severe pain. What changed: Another medication with the same name was removed. Continue taking this medication, and follow the directions you see here.   polyethylene glycol 17 g packet Commonly known as: MIRALAX / GLYCOLAX Take 17 g by mouth daily. Start taking on: September 08, 2018   tamsulosin 0.4 MG Caps capsule Commonly known as: FLOMAX Take 0.4 mg by mouth 2 (two) times daily.   vitamin C 500 MG tablet Commonly known as: ASCORBIC ACID Take 500 mg by mouth daily.   vitamin E 400 UNIT capsule Take 400 Units by mouth daily.      Allergies  Allergen Reactions   Iodinated Diagnostic Agents     Other reaction(s): Fever   Iodine-131 Other (See Comments)     Breakout, fever   Iohexol      Code: RASH, Desc: PT STATES HOT ALL OVER HAD TO HAVE ICE PACKS ALL OVER HIM AND DIFF BREATHING, PT REFUSED IV DYE     The results of significant diagnostics from this hospitalization (including imaging, microbiology, ancillary and laboratory) are listed below for reference.    Significant Diagnostic Studies: Dg Foot Complete Right  Result Date: 08/20/2018 CLINICAL DATA:  Great toe pain and swelling. EXAM: RIGHT FOOT COMPLETE - 3+ VIEW COMPARISON:  05/29/2018 MRI. FINDINGS: The bony structures are intact. I  do not see any definite destructive bony changes to suggest osteomyelitis. No definite findings for septic arthritis. Extensive vascular calcifications are noted. IMPRESSION: No acute bony findings or destructive bony changes. Electronically Signed   By: Marijo Sanes M.D.   On: 08/20/2018 19:13   Vas [place] Burnard Bunting With/wo Tbi  Result Date: 08/10/2018 LOWER EXTREMITY DOPPLER STUDY Indications: Rest pain, gangrene, peripheral artery disease, and of right foot              follow up. Patient had atherectomy on 08/03/2018. High Risk         Hypertension, hyperlipidemia, Diabetes, past history of Factors:          smoking.  Vascular Interventions: Patient had on 08/03/2018 [facility] orbital rotational                         atherectomy ,Chocolate balloon angioplasty, and                         Drug-coated balloon angioplasty distal right                         SFA/above-knee popliteal artery. Limitations: Patients right foot and 1st toe are wrapped. The foot and toe are              purple in color. There was oozing of blood from the right heel              today. Comparison Study: Previous ABI done 07/21/2018 right .43 left .25 Performing Technologist: Salvadore Dom RVT, RDCS (AE), RDMS  Examination Guidelines: A complete evaluation includes at minimum, Doppler waveform signals and systolic blood pressure reading at the level of bilateral brachial, anterior tibial, and  posterior tibial arteries, when vessel segments are accessible. Bilateral testing is considered an integral part of a complete examination. Photoelectric Plethysmograph (PPG) waveforms and toe systolic pressure readings are included as required and additional duplex testing as needed. Limited examinations for reoccurring indications may be performed as noted.  ABI Findings: +---------+------------------+-----+--------+-----------------------------+  Right     Rt Pressure (mmHg) Index Waveform Comment                        +---------+------------------+-----+--------+-----------------------------+  Brachial  184                                                              +---------+------------------+-----+--------+-----------------------------+  ATA       112                0.61                                          +---------+------------------+-----+--------+-----------------------------+  PTA       63                 0.34                                          +---------+------------------+-----+--------+-----------------------------+  PERO      71                 0.39                                          +---------+------------------+-----+--------+-----------------------------+  Great Toe                                   not attempted due to bandage.  +---------+------------------+-----+--------+-----------------------------+ +---------+------------------+-----+--------+--------------------+  Left      Lt Pressure (mmHg) Index Waveform Comment               +---------+------------------+-----+--------+--------------------+  Brachial  180                                                     +---------+------------------+-----+--------+--------------------+  ATA       93                 0.51                                 +---------+------------------+-----+--------+--------------------+  PTA       59                 0.32                                  +---------+------------------+-----+--------+--------------------+  PERO      94                 0.51                                 +---------+------------------+-----+--------+--------------------+  Great Toe                          absent   no waveform detected  +---------+------------------+-----+--------+--------------------+ +-------+-----------+-----------+------------+------------+  ABI/TBI Today's ABI Today's TBI Previous ABI Previous TBI  +-------+-----------+-----------+------------+------------+  Right   .61         0           .43          0             +-------+-----------+-----------+------------+------------+  Left    .51         0           .50          0             +-------+-----------+-----------+------------+------------+ Right ABIs appear increased. Left ABIs appear essentially unchanged. Right TBI not attempted due to bandage. Left TBI the waveform was absent.  Summary: Right: Resting right ankle-brachial index indicates moderate right lower extremity arterial disease. There is improvement in today's ABI compared to prior test. The right TBI was not attempted due to bandage. Left: Resting left ankle-brachial index indicates moderate left lower extremity arterial disease. The left ABI is essentially unchanged from prior exam. Left TBI, the waveform was  absent.  *See table(s) above for measurements and observations.  Suggest follow up study in 6 months. Electronically signed by Jenkins Rouge MD on 08/10/2018 at 5:41:56 PM.    Final    Vas [place] Lower Extremity Arterial Duplex  Result Date: 08/10/2018 LOWER EXTREMITY ARTERIAL DUPLEX STUDY Indications: Rest pain, gangrene, peripheral artery disease, and follow up to              right SFA atherectomy. Patient has continued extreme pain and              discomfort in the right foot and heel. He states his leg does not              feel any different since the procedure. High Risk         Hypertension, hyperlipidemia, Diabetes, past history of  Factors:          smoking.  Vascular Interventions: Patient had on 08/03/2018 [facility] orbital rotational                         atherectomy ,Chocolate balloon angioplasty, and                         Drug-coated balloon angioplasty distal right                         SFA/above-knee popliteal artery. Current ABI:            Right .61 Left .51 Limitations: The right foot and 1st toe were wrapped, the heel was oozing a              little blood during the exam. Comparison Study: Prior right arterial duplex done 07/21/2018 showed an occluded                   proximal popiteal artery. There is known occlusion in the PTA                   and peroneal artery. Baker's cyst measuring 6.1 x .8 x 2.1 cm. Performing Technologist: Salvadore Dom RVT, RDCS (AE), RDMS  Examination Guidelines: A complete evaluation includes B-mode imaging, spectral Doppler, color Doppler, and power Doppler as needed of all accessible portions of each vessel. Bilateral testing is considered an integral part of a complete examination. Limited examinations for reoccurring indications may be performed as noted.  +----------+--------+-----+--------+--------+--------+  RIGHT      PSV cm/s Ratio Stenosis Waveform Comments  +----------+--------+-----+--------+--------+--------+  CFA Prox   101                     biphasic           +----------+--------+-----+--------+--------+--------+  CFA Distal 143                     biphasic           +----------+--------+-----+--------+--------+--------+  DFA        252                     biphasic           +----------+--------+-----+--------+--------+--------+  SFA Prox   117                     biphasic           +----------+--------+-----+--------+--------+--------+  SFA Mid  73                      biphasic           +----------+--------+-----+--------+--------+--------+  SFA Distal 121                     biphasic           +----------+--------+-----+--------+--------+--------+  POP Prox   67                       biphasic           +----------+--------+-----+--------+--------+--------+  POP Mid    49                      biphasic           +----------+--------+-----+--------+--------+--------+  POP Distal 74                      biphasic           +----------+--------+-----+--------+--------+--------+  TP Trunk   61                      biphasic           +----------+--------+-----+--------+--------+--------+  Summary: Right: The popiteal artery is now patent throughout its course down to the tibioperoneal trunk. There is diffuse atherosclerosis seen in the common femoral artery, superficial artery and popiteal artery. There is a 5.2 x 2.1 x 3.2 cm Baker's cyst in the popiteal fossa. Marked improvement is noted compared to previous study.  See table(s) above for measurements and observations. Suggest follow up study in 6 months. Electronically signed by Jenkins Rouge MD on 08/10/2018 at 5:41:30 PM.    Final     Microbiology: Recent Results (from the past 240 hour(s))  Novel Coronavirus, NAA (hospital order; send-out to ref lab)     Status: None   Collection Time: 08/31/18 11:45 AM   Specimen: Nasopharyngeal Swab; Respiratory  Result Value Ref Range Status   SARS-CoV-2, NAA NOT DETECTED NOT DETECTED Final    Comment: (NOTE) This test was developed and its performance characteristics determined by [employer]. This test has not been FDA cleared or approved. This test has been authorized by FDA under an Emergency Use Authorization (EUA). This test is only authorized for the duration of time the declaration that circumstances exist justifying the authorization of the emergency use of in vitro diagnostic tests for detection of SARS-CoV-2 virus and/or diagnosis of COVID-19 infection under section 564(b)(1) of the Act, 21 U.S.C. 993ZJI-9(C)(7), unless the authorization is terminated or revoked sooner. When diagnostic testing is negative, the possibility of a false negative result should be  considered in the context of a patient's recent exposures and the presence of clinical signs and symptoms consistent with COVID-19. An individual without symptoms of COVID-19 and who is not shedding SARS-CoV-2 virus would expect to have a negative (not detected) result in this assay. Performed  At: [facility] [address] [place], [place] [id number] Rush Farmer MD [id number]    [place]  Final    Comment: Performed at [facility], [place] [address], [place],  [address]     Labs: Basic Metabolic Panel: Recent Labs  Lab 09/01/18 0413  09/03/18 0445 09/04/18 0314 09/05/18 0314 09/06/18 0419 09/07/18 0344  NA 130*  --  129* 128*  --   --  130*  K 4.4  --  4.4 4.3  --   --  4.3  CL 96*  --  97* 97*  --   --  100  CO2 25  --  23 23  --   --  19*  GLUCOSE 180*  --  138* 174*  --   --  132*  BUN 26*  --  22 21  --   --  19  CREATININE 1.20   < > 1.14 1.09 1.02 1.10 1.12  CALCIUM 8.1*  --  8.2* 7.7*  --   --  8.3*   < > = values in this interval not displayed.   Liver Function Tests: No results for input(s): AST, ALT, ALKPHOS, BILITOT, PROT, ALBUMIN in the last 168 hours. No results for input(s): LIPASE, AMYLASE in the last 168 hours. No results for input(s): AMMONIA in the last 168 hours. CBC: Recent Labs  Lab 09/01/18 0413  WBC 12.4*  HGB 8.2*  HCT 25.3*  MCV 95.5  PLT 278   Cardiac Enzymes: No results for input(s): CKTOTAL, CKMB, CKMBINDEX, TROPONINI in the last 168 hours. BNP: BNP (last 3 results) No results for input(s): BNP in the last 8760 hours.  ProBNP (last 3 results) No results for input(s): PROBNP in the last 8760 hours.  CBG: Recent Labs  Lab 09/06/18 1113 09/06/18 1611 09/06/18 2045 09/07/18 0745 09/07/18 1146  GLUCAP 154* 147* 164* 128* 157*    Principal Problem:   Ischemic foot Active Problems:   Essential hypertension   Aortic valve stenosis   Hypothyroidism   PAF (paroxysmal atrial  fibrillation) (HCC)   Atrial fibrillation, chronic   Cellulitis of great toe of right foot   Type 2 diabetes mellitus with foot ulcer, without long-term current use of insulin (HCC)   Type 2 diabetes mellitus with foot ulcer and gangrene (HCC)   Severe protein-calorie malnutrition ([place])   Goals of care, counseling/discussion   Palliative care by specialist   DNR (do not resuscitate) discussion   Gangrene of right foot ([place])   Time coordinating discharge: 38 minutes.  Signed:        Kimmora Risenhoover, DO [facility]  09/07/2018, 1:09 PM

## 2018-09-07 NOTE — Progress Notes (Signed)
Physical Therapy Treatment Patient Details Name: CALEM COCOZZA MRN: [id number] DOB: Jun 12, 1927 Today's Date: 09/07/2018    History of Present Illness ARTAVIUS STEARNS is a 85 y.o. male with history of PVD who has recently underwent R LE intervention for peripheral vascular disease presents to the ER because of worsening pain and redness of the right lower extremity.  Patient has been having a gangrenous toe which has been progressing getting worse over the last 1 month and has progressed to dorsal foot. Pt underwent R BKA on 8/14.    PT Comments    Patient received in bed, using theraband for UE. Patient agrees to PT session. Performed supine to sit with min guard and modified independence with use of rail and increased time. Patient performed sit to stand with RW and min/mod assist from elevated bed x 2 reps. Stood about 30 seconds each time. He was able to pivot around to recliner using RW and min assist. Attempted to hop, but was unsuccessful. Patient will continue to benefit from skilled PT to address his weakness, difficulty with mobility and decreased independence level.      Follow Up Recommendations  CIR     Equipment Recommendations  None recommended by PT;Other (comment)(can be determined in next venue)    Recommendations for Other Services Rehab consult     Precautions / Restrictions Precautions Precautions: Fall Precaution Comments: pt with R BKA and splint/shrinker from [employer] Other Brace: splint and shrinker from [employer] Restrictions Weight Bearing Restrictions: Yes RLE Weight Bearing: Non weight bearing    Mobility  Bed Mobility Overal bed mobility: Needs Assistance Bed Mobility: Supine to Sit     Supine to sit: Supervision;Modified independent (Device/Increase time)     General bed mobility comments: Use of hand rail to assist with mobility, increased time  Transfers Overall transfer level: Needs assistance Equipment used: Rolling walker (2  wheeled) Transfers: Sit to/from Stand Sit to Stand: Min assist;From elevated surface         General transfer comment: patient able to pivot in standing with RW from bed to recliner toward left side. Min assist  Ambulation/Gait Ambulation/Gait assistance: Min Web designer (Feet): 2 Feet Assistive device: Rolling walker (2 wheeled)       General Gait Details: pivot to chair only, unable to hop   Stairs             Wheelchair Mobility    Modified Rankin (Stroke Patients Only)       Balance Overall balance assessment: Needs assistance Sitting-balance support: Feet supported Sitting balance-Leahy Scale: Good     Standing balance support: Bilateral upper extremity supported Standing balance-Leahy Scale: Fair Standing balance comment: requires B UE support on RW and physical assist with cues for upright, hip extension and BUEs extension. Stood for ~30-40 seconds x 2 reps.                            Cognition Arousal/Alertness: Awake/alert Behavior During Therapy: WFL for tasks assessed/performed Overall Cognitive Status: Within Functional Limits for tasks assessed Area of Impairment: Problem solving                     Memory: Decreased short-term memory Following Commands: Follows one step commands consistently;Follows one step commands with increased time     Problem Solving: Slow processing;Requires verbal cues;Requires tactile cues General Comments: Slow processing/response time to commands; tactile cues      Exercises Other  Exercises Other Exercises: Static standing, L LE exercises: ap, heel slides, hip abd/add, SLR x 10 reps    General Comments        Pertinent Vitals/Pain Pain Assessment: No/denies pain    Home Living                      Prior Function            PT Goals (current goals can now be found in the care plan section) Acute Rehab PT Goals Patient Stated Goal: to improve mobility PT Goal  Formulation: With patient/family Time For Goal Achievement: 09/12/18 Potential to Achieve Goals: Good Progress towards PT goals: Progressing toward goals    Frequency    Min 3X/week      PT Plan Current plan remains appropriate    Co-evaluation              AM-PAC PT "6 Clicks" Mobility   Outcome Measure  Help needed turning from your back to your side while in a flat bed without using bedrails?: A Little Help needed moving from lying on your back to sitting on the side of a flat bed without using bedrails?: A Little Help needed moving to and from a bed to a chair (including a wheelchair)?: A Lot Help needed standing up from a chair using your arms (e.g., wheelchair or bedside chair)?: A Lot Help needed to walk in hospital room?: Total Help needed climbing 3-5 steps with a railing? : Total 6 Click Score: 12    End of Session Equipment Utilized During Treatment: Gait belt Activity Tolerance: Patient tolerated treatment well Patient left: in chair;with call bell/phone within reach Nurse Communication: Mobility status PT Visit Diagnosis: Muscle weakness (generalized) (M62.81);Difficulty in walking, not elsewhere classified (R26.2);Other abnormalities of gait and mobility (R26.89)     Time: 1000-1025 PT Time Calculation (min) (ACUTE ONLY): 25 min  Charges:  $Gait Training: 8-22 mins $Therapeutic Exercise: 8-22 mins                     Ulysess Witz, PT, GCS 09/07/18,10:41 AM

## 2018-09-07 NOTE — Progress Notes (Addendum)
Travis Staggers, MD  Physician  Physical Medicine and Rehabilitation  PMR Pre-admission  Signed  Date of Service:  09/07/2018 12:03 PM      Related encounter: ED to Hosp-Admission (Discharged) from 08/20/2018 in [facility]      Signed         PMR Admission Coordinator Pre-Admission Assessment  Patient: [name] is an 85 y.o., male MRN: [id number] DOB: 1927/12/21 Height: 5' 11.5" (181.6 cm) Weight: 104 kg  Insurance Information HMO: yes   PPO:      PCP:      IPA:      80/20:      OTHER:  PRIMARY: U[employer] Medicare      Policy#: [id number]      Subscriber: Patient CM Name: Watt Climes      Phone#: [phone]     Fax#: [phone] Pre-Cert#: [id number]      Employer:  Josem Kaufmann provided by Watt Climes for admit to CIR on 09/07/2018. Auth: [id number]. Pt is approved for 7 days. Follow up CM is Princess (p): [phone]; (f): [phone] Benefits:  Phone #: online     Name: [email or web address] Eff. Date: 01/14/2018 still active     Deduct: $0      Out of Pocket Max: $3,600 (met [phone] met)      Life Max: NA CIR: $295/day co-pay for days 1-5, $0/day co-pay for days 6+      SNF: $0/day co-pay for days 1-20, $160/day co-pay for days 21-43, $0/day co pay for days 44-100; limited to 100 days/cal yr Outpatient: limited by medical necessity     Co-Pay: $30/visit Home Health: 100%; limited by medical necessity      Co-Pay: 0% co-insurance DME: 80%     Co-Pay: 20% co-insurance  Providers:  SECONDARY: None      Policy#:       Subscriber:  CM Name:       Phone#:      Fax#:  Pre-Cert#:       Employer:  Benefits:  Phone #:      Name:  Eff. Date:      Deduct:      Out of Pocket Max:       Life Max:  CIR:       SNF:  Outpatient:      Co-Pay:  Home Health:       Co-Pay:  DME:      Co-Pay:   Medicaid Application Date:       Case Manager:  Disability Application Date:       Case Worker:   The Data Collection Information Summary for patients in Inpatient  Rehabilitation Facilities with attached Privacy Act [place] Records was provided and verbally reviewed with: Patient and Family  Emergency Contact Information         Contact Information    Name Relation Home Work [place] Daughter [phone]  [phone]   Myers,Betty Sister [phone]     Cayton, Cuevas   [phone]   Fynn, Vanblarcom   [phone]      Current Medical History  Patient Admitting Diagnosis: Right BKA  History of Present Illness: [name] is an 85 year old male with history of diabetes mellitus, hypertension, hyperlipidemia, peripheral vascular disease maintained on aspirin and Plavix with recent revascularization procedure right lower extremity. Presented 08/24/2018 with gangrenous right foot and toe that has progressed over the past month. Limb was not felt to be salvageable and underwent right transtibial  amputation 08/28/2018 per Dr. Sharol Given and application of wound VAC. Splint and shrinker provided by [employer] prosthetics. Hospital course pain management. Subcutaneous Lovenox for DVT prophylaxis. Acute blood loss anemia7-6 8.2and has received 2 units packed red blood cells with mild leukocytosis 12,500.Mild hyponatremia 71moitored.Intermittent bouts of confusion felt to be related to narcotics/multifactorial.Palliative care did follow-up to establish goals of care. Therapy evaluation completed and patient is to be admitted for a comprehensive rehab program on 09/07/2018    [facility] admission [facility] and physician.  Past Medical History      Past Medical History:  Diagnosis Date   Arthritis    Borderline diabetic    Glaucoma    Hyperlipidemia    Hypertension    PVD (peripheral vascular disease) (HCC)    Sleep apnea    Cpap ordered but doesnt use    Family History   family history  includes Cancer in his mother; Heart attack in his brother, brother, and father; Stroke in his father.  Prior Rehab/Hospitalizations Has the patient had prior rehab or hospitalizations prior to admission? Yes  Has the patient had major surgery during 100 days prior to admission? Yes             Current Medications  Current Facility-Administered Medications:    0.9 %  sodium chloride infusion, 250 mL, Intravenous, PRN, DNewt Minion MD   0.9 %  sodium chloride infusion, , Intravenous, Continuous, DNewt Minion MD   acetaminophen (TYLENOL) tablet 325-650 mg, 325-650 mg, Oral, Q6H PRN, DNewt Minion MD, 650 mg at 09/03/18 2303   albuterol (PROVENTIL) (2.5 MG/3ML) 0.083% nebulizer solution 2.5 mg, 2.5 mg, Nebulization, Q6H PRN, Swayze, Ava, DO   atorvastatin (LIPITOR) tablet 40 mg, 40 mg, Oral, QPC supper, DNewt Minion MD, 40 mg at 09/06/18 1900   bisacodyl (DULCOLAX) suppository 10 mg, 10 mg, Rectal, Daily PRN, DNewt Minion MD   calcium carbonate (TUMS - dosed in mg elemental calcium) chewable tablet 200 mg of elemental calcium, 1 tablet, Oral, PRN, DNewt Minion MD, 200 mg of elemental calcium at 08/24/18 0042   clopidogrel (PLAVIX) tablet 75 mg, 75 mg, Oral, Daily, DNewt Minion MD, 75 mg at 09/07/18 01287  docusate sodium (COLACE) capsule 100 mg, 100 mg, Oral, BID PRN, DNewt Minion MD, 100 mg at 08/28/18 1734   docusate sodium (COLACE) capsule 100 mg, 100 mg, Oral, BID, DNewt Minion MD, 100 mg at 09/07/18 0953   dorzolamide-timolol (COSOPT) 22.3-6.8 MG/ML ophthalmic solution 1 drop, 1 drop, Both Eyes, BID, DNewt Minion MD, 1 drop at 09/06/18 2149   doxazosin (CARDURA) tablet 2 mg, 2 mg, Oral, QHS, DNewt Minion MD, 2 mg at 09/06/18 2149   enoxaparin (LOVENOX) injection 40 mg, 40 mg, Subcutaneous, Q24H, DNewt Minion MD, 40 mg at 09/07/18 0955   famotidine (PEPCID) tablet 20 mg, 20 mg, Oral, BID, DNewt Minion MD, 20 mg at 09/07/18 08676   finasteride (PROSCAR) tablet 5 mg, 5 mg, Oral, QPM, DNewt Minion MD, 5 mg at 09/06/18 1859   HYDROmorphone (DILAUDID) injection 0.5-1 mg, 0.5-1 mg, Intravenous, Q4H PRN, DNewt Minion MD, 0.5 mg at 09/02/18 1309   insulin aspart (novoLOG) injection 0-9 Units, 0-9 Units, Subcutaneous, TID WC, DNewt Minion MD, 1 Units at 09/07/18 0954   insulin glargine (LANTUS) injection 10 Units, 10 Units, Subcutaneous, Daily, PDarliss Cheney MD, 10 Units  at 09/07/18 0954   levothyroxine (SYNTHROID) tablet 50 mcg, 50 mcg, Oral, Q0600, [name], MD, 50 mcg at 09/07/18 0523   magnesium citrate solution 1 Bottle, 1 Bottle, Oral, Once PRN, [name], MD   methocarbamol (ROBAXIN) tablet 500 mg, 500 mg, Oral, Q6H PRN, 500 mg at 08/30/18 2142 **OR** methocarbamol (ROBAXIN) 500 mg in dextrose 5 % 50 mL IVPB, 500 mg, Intravenous, Q6H PRN, [name], MD   metoCLOPramide (REGLAN) tablet 5-10 mg, 5-10 mg, Oral, Q8H PRN **OR** metoCLOPramide (REGLAN) injection 5-10 mg, 5-10 mg, Intravenous, Q8H PRN, [name], MD   ondansetron (ZOFRAN) tablet 4 mg, 4 mg, Oral, Q6H PRN **OR** ondansetron (ZOFRAN) injection 4 mg, 4 mg, Intravenous, Q6H PRN, [name], MD   oxyCODONE (Oxy IR/ROXICODONE) immediate release tablet 10-15 mg, 10-15 mg, Oral, Q4H PRN, [name], MD, 15 mg at 09/05/18 0175   oxyCODONE (Oxy IR/ROXICODONE) immediate release tablet 5-10 mg, 5-10 mg, Oral, Q4H PRN, [name], MD, 10 mg at 08/28/18 1604   oxyCODONE-acetaminophen (PERCOCET/ROXICET) 5-325 MG per tablet 1 tablet, 1 tablet, Oral, Q8H PRN, [name], MD, 1 tablet at 08/29/18 2217   polyethylene glycol (MIRALAX / GLYCOLAX) packet 17 g, 17 g, Oral, Daily PRN, [name], MD   polyethylene glycol (MIRALAX / GLYCOLAX) packet 17 g, 17 g, Oral, Daily, Vasireddy, Grier Mitts, MD, 17 g at 09/06/18 1034   pyridOXINE (VITAMIN B-6) tablet 100 mg, 100 mg, Oral, Daily, [name] V, MD, 100 mg at 09/07/18 0954    sodium chloride flush (NS) 0.9 % injection 3 mL, 3 mL, Intravenous, Q12H, [name], MD, 3 mL at 09/06/18 2151   sodium chloride flush (NS) 0.9 % injection 3 mL, 3 mL, Intravenous, PRN, [name], MD   tamsulosin [facility]) capsule 0.4 mg, 0.4 mg, Oral, BID, [name], MD, 0.4 mg at 09/07/18 1025   vitamin C (ASCORBIC ACID) tablet 500 mg, 500 mg, Oral, Daily, [name], MD, 500 mg at 09/07/18 8527   vitamin E capsule 400 Units, 400 Units, Oral, Daily, [name], MD, 400 Units at 09/07/18 7824  Patients Current Diet:     Diet Order                  Diet Carb Modified Fluid consistency: Thin; Room service appropriate? No  Diet effective now               Precautions / Restrictions Precautions Precautions: Fall Precaution Comments: pt with R BKA and splint/shrinker from [employer] Other Brace: splint and shrinker from [employer] Restrictions Weight Bearing Restrictions: Yes RLE Weight Bearing: Non weight bearing   Has the patient had 2 or more falls or a fall with injury in the past year? No  Prior Activity Level Limited community (1-2x/wk): pt would go to appointments only, wound care (all since May/June since infection in toe). Was much more active prior to infection, farmed, yardwork.   Prior Functional Level Self Care: Did the patient need help bathing, dressing, using the toilet or eating? Needed some help (set up, Min A to dress).   Indoor Mobility: Did the patient need assistance with walking from room to room (with or without device)? Needed some help (min A/Mod A recently since infection began).   Stairs: Did the patient need assistance with internal or external stairs (with or without device)? Unknown  Functional Cognition: Did the patient need help planning regular tasks such as shopping  or remembering to take medications? Needed some help (supervision)  Home Assistive Devices / [place] Devices/Equipment:  None  Prior Device Use: Indicate devices/aids used by the patient prior to current illness, exacerbation or injury? Manual wheelchair and Walker  Current Functional Level Cognition  Overall Cognitive Status: Within Functional Limits for tasks assessed Orientation Level: Oriented X4 Following Commands: Follows one step commands consistently, Follows one step commands with increased time General Comments: Slow processing/response time to commands; tactile cues    Extremity Assessment (includes Sensation/Coordination)  Upper Extremity Assessment: Generalized weakness  Lower Extremity Assessment: Defer to PT evaluation RLE Deficits / Details: knee in splint but was able to initiate knee flexion AA when splint removed, able to intiated SLF with splint on LLE Deficits / Details: able to intiate knee to chest and ankle pumps    ADLs  Overall ADL's : Needs assistance/impaired Grooming: Min guard, Sitting Upper Body Bathing: Min guard, Sitting Lower Body Bathing: Moderate assistance, Sitting/lateral leans Upper Body Dressing : Min guard, Sitting Lower Body Dressing: Total assistance, Sit to/from stand Lower Body Dressing Details (indicate cue type and reason): unable to pull sock up in long leg sitting in recliner Toilet Transfer: Requires drop arm, Minimal assistance, +2 for physical assistance Toilet Transfer Details (indicate cue type and reason): simulated to drop arm recliner  Toileting- Clothing Manipulation and Hygiene: Moderate assistance, Sitting/lateral lean Functional mobility during ADLs: Cueing for sequencing, Cueing for safety, Minimal assistance General ADL Comments: pt limited by generalized weakness, decreased activity tolerance and impaired balance, verbal cues for sequencing and safety awareness    Mobility  Overal bed mobility: Needs Assistance Bed Mobility: Supine to Sit Rolling: Min guard Sidelying to sit: Min assist, HOB elevated Supine to sit: Supervision,  Modified independent (Device/Increase time) Sit to supine: Max assist General bed mobility comments: Use of hand rail to assist with mobility, increased time    Transfers  Overall transfer level: Needs assistance Equipment used: Rolling walker (2 wheeled) Transfer via Lift Equipment: Stedy Transfers: Sit to/from [employer] to Stand: Min assist, From elevated surface  Lateral/Scoot Transfers: Min assist, +2 physical assistance General transfer comment: patient able to pivot in standing with RW from bed to recliner toward left side. Min assist    Ambulation / Gait / Stairs / Emergency planning/management officer  Ambulation/Gait Ambulation/Gait assistance: Herbalist (Feet): 2 Feet Assistive device: Rolling walker (2 wheeled) General Gait Details: pivot to chair only, unable to hop    Posture / Balance Dynamic Sitting Balance Sitting balance - Comments: Requires UE support for sitting balance, close min guard. Balance Overall balance assessment: Needs assistance Sitting-balance support: Feet supported Sitting balance-Leahy Scale: Good Sitting balance - Comments: Requires UE support for sitting balance, close min guard. Postural control: Posterior lean Standing balance support: Bilateral upper extremity supported Standing balance-Leahy Scale: Fair Standing balance comment: requires B UE support on RW and physical assist with cues for upright, hip extension and BUEs extension. Stood for ~30-40 seconds x 2 reps.    Special needs/care consideration BiPAP/CPAP : no CPM : no Continuous Drip IV : 0.9% sodium chloride infusion  Dialysis : no        Days : no Life Vest : no Oxygen : no Special Bed : no Trach Size : no Wound Vac (area) : yes      Location : RLE to surgical site Skin: amputation to RLE surgical incision, rash to back, skin tear to penis  Bowel mgmt: last BM: 09/07/2018; continent Bladder mgmt: incontinence, external catheter Diabetic mgmt: yes Behavioral  consideration : no Chemo/radiation : no   Previous Home Environment (from acute therapy documentation) Living Arrangements: Alone Home Care Services: No Additional Comments: pt lived alone and was completely indep up until 2 weeks ago  Discharge Living Setting Plans for Discharge Living Setting: Patient's home, Alone, House(will have 24/7 family assist at DC per son and daugther) Type of Home at Discharge: House Discharge Home Layout: One level Discharge Home Access: [place] entrance Discharge Bathroom Shower/Tub: Walk-in shower Discharge Bathroom Toilet: Handicapped height Discharge Bathroom Accessibility: Yes How Accessible: Accessible via walker Does the patient have any problems obtaining your medications?: No  Social/Family/Support Systems Patient Roles: Other (Comment)(has multiple family members who [place]) Contact Information: [name] (daughter): [phone]; [name] (son): [phone] Anticipated Caregiver: son and daugther and other family members to [place] in as needed Anticipated Caregiver's Contact Information: see above Ability/Limitations of Caregiver: Min A Caregiver Availability: 24/7 Discharge Plan Discussed with Primary Caregiver: Yes Is Caregiver In Agreement with Plan?: Yes Does Caregiver/Family have Issues with Lodging/Transportation while Pt is in Rehab?: No  Designated visitor: son [name] during weekdays; daughter [name] during weekends.   Goals/Additional Needs Patient/Family Goal for Rehab: PT/OT: Min A; SLP: NA Expected length of stay: 10-14 days Cultural Considerations: Christian Dietary Needs: carb modified, thin liquids; calorie level 1600-2000. Room service not appropriate.  Equipment Needs: TBD Pt/Family Agrees to Admission and willing to participate: Yes Program Orientation Provided & Reviewed with Pt/Caregiver Including Roles  & Responsibilities: Yes(pt, daughter, and son)  Barriers to Discharge: Home environment access/layout,  Weight bearing restrictions  Barriers to Discharge Comments: bathroom may not be wc accessible.   Decrease burden of Care through IP rehab admission: MA  Possible need for SNF placement upon discharge: Not anticipated. Pt has confirmed 24/7 A at DC between his grown children. They understand anticipated assist level at DC. Pt has ramped entrance for accessibility at DC.   Patient Condition: I have reviewed medical records from [facility], spoken with CM, and patient, son and daughter. I met with patient at the bedside for inpatient rehabilitation assessment.  Patient will benefit from ongoing PT and OT, can actively participate in 3 hours of therapy a day 5 days of the week, and can make measurable gains during the admission.  Patient will also benefit from the coordinated team approach during an Inpatient Acute Rehabilitation admission.  The patient will receive intensive therapy as well as Rehabilitation physician, nursing, social worker, and care management interventions.  Due to bladder management, bowel management, safety, skin/wound care, disease management, medication administration, pain management and patient education the patient requires 24 hour a day rehabilitation nursing.  The patient is currently Min A for 2 feet with RW with mobility and Min Ax2 to total A for basic ADLs.  Discharge setting and therapy post discharge at home with home health is anticipated.  Patient has agreed to participate in the Acute Inpatient Rehabilitation Program and will admit today.  Preadmission Screen Completed By:  [name], 09/07/2018 1:00 PM ______________________________________________________________________   Discussed status with Dr. Naaman Plummer on 09/07/2018 at 12:34PM and received approval for admission today.  Admission Coordinator:  [name], OT, time 12:34PM/Date 09/07/2018   Assessment/Plan: Diagnosis: right BKA 1. Does the need for close, 24 hr/day Medical supervision  in concert with the patient's rehab needs make it unreasonable for this patient to be served in a less intensive setting? Yes  2. Co-Morbidities requiring supervision/potential complications: DM, HTN, PVD 3. Due to bladder management, bowel management, safety, skin/wound care, disease management, medication administration, pain management and patient education, does the patient require 24 hr/day rehab nursing? Yes 4. Does the patient require coordinated care of a physician, rehab nurse, PT (1-2 hrs/day, 5 days/week) and OT (1-2 hrs/day, 5 days/week) to address physical and functional deficits in the context of the above medical diagnosis(es)? Yes Addressing deficits in the following areas: balance, endurance, locomotion, strength, transferring, bowel/bladder control, bathing, dressing, feeding, grooming, toileting and psychosocial support 5. Can the patient actively participate in an intensive therapy program of at least 3 hrs of therapy 5 days a week? Yes 6. The potential for patient to make measurable gains while on inpatient rehab is excellent 7. Anticipated functional outcomes upon discharge from inpatients are: min assist PT, min assist OT, n/a SLP 8. Estimated rehab length of stay to reach the above functional goals is: 10-14 days 9. Anticipated D/C setting: Home 10. Anticipated post D/C treatments: Felt therapy 11. Overall Rehab/Functional Prognosis: excellent  MD Signature: Travis Staggers, MD, [facility] 09/07/2018         Revision History Date/Time User Provider Type Action  09/07/2018 1:04 PM Travis Staggers, MD Physician Sign  09/07/2018 1:01 PM [name], OT Rehab Admission Coordinator Share  View Details Report

## 2018-09-07 NOTE — H&P (Signed)
Physical Medicine and Rehabilitation Admission H&P        Chief Complaint  Patient presents with  . Foot Pain  . Foot Swelling  . Post-Op Peripheral Artery bypass  : HPI: [name] is an 85 year old right-handed male with history of diabetes mellitus, hypertension, hyperlipidemia, peripheral vascular disease maintained on aspirin and Plavix with recent revascularization procedure right lower extremity.  Per chart review patient lives alone completely independent up till 2 weeks ago driving doing his own grocery shopping independent ADLs.  He does have family in the area who plan to assist as needed on discharge.  Presented 08/24/2018 with gangrenous right foot and toe that has progressed over the past month.  Limb was not felt to be salvageable and underwent right transtibial amputation 08/28/2018 per Dr. Sharol Given and application of wound VAC.  Splint and shrinker provided by [employer] prosthetics.  Hospital course pain management.  Subcutaneous Lovenox for DVT prophylaxis.  Acute blood loss anemia7-6 8.2 and has received 2 units packed red blood cells with mild leukocytosis 12,500.  Mild hyponatremia 128 monitored.  Intermittent bouts of confusion felt to be related to narcotics/multifactorial.  Palliative care did follow-up to establish goals of care.  Therapy evaluation completed and patient was admitted for a comprehensive rehab program.   Review of Systems  Constitutional: Negative for chills and fever.  HENT: Positive for hearing loss.   Eyes: Negative for blurred vision and double vision.  Respiratory: Positive for shortness of breath. Negative for cough.   Cardiovascular: Positive for leg swelling. Negative for chest pain and palpitations.  Gastrointestinal: Positive for constipation. Negative for heartburn, nausea and vomiting.  Genitourinary: Negative for dysuria, flank pain and hematuria.  Musculoskeletal: Positive for myalgias.  Skin: Negative for rash.  All other systems reviewed  and are negative.       Past Medical History:  Diagnosis Date  . Arthritis    . Borderline diabetic    . Glaucoma    . Hyperlipidemia    . Hypertension    . PVD (peripheral vascular disease) ([place])    . Sleep apnea      Cpap ordered but doesnt use        Past Surgical History:  Procedure Laterality Date  . ABDOMINAL AORTOGRAM W/LOWER EXTREMITY N/A 07/27/2018    Procedure: ABDOMINAL AORTOGRAM W/LOWER EXTREMITY;  Surgeon: Lorretta Harp, MD;  Location: [place] [facility];  Service: Cardiovascular;  Laterality: N/A;  . AMPUTATION Right 08/28/2018    Procedure: RIGHT BELOW KNEE AMPUTATION;  Surgeon: [name], MD;  Location: [facility];  Service: Orthopedics;  Laterality: Right;  . APPENDECTOMY   1943  . CATARACT EXTRACTION   2012    x2  . COLONOSCOPY N/A 11/01/2014    Procedure: COLONOSCOPY;  Surgeon: Aviva Signs, MD;  [facility];  Service: Gastroenterology;  Laterality: N/A;  . ESOPHAGOGASTRODUODENOSCOPY N/A 11/01/2014    Procedure: ESOPHAGOGASTRODUODENOSCOPY (EGD);  Surgeon: Aviva Signs, MD;  [facility];  Service: Gastroenterology;  Laterality: N/A;  . HERNIA REPAIR Left    . INGUINAL HERNIA REPAIR Right 03/01/2013    Procedure: RIGHT INGUINAL HERNIORRHAPHY;  Surgeon: Jamesetta So, MD;  Location: [facility];  Service: General;  Laterality: Right;  . INSERTION OF MESH Right 03/01/2013    Procedure: INSERTION OF MESH;  Surgeon: Jamesetta So, MD;  Location: [facility];  Service: General;  Laterality: Right;  . LOWER EXTREMITY ANGIOGRAPHY Right 08/25/2018    Procedure: LOWER EXTREMITY  ANGIOGRAPHY;  Surgeon: Wellington Hampshire, MD;  Location: [place] [facility];  Service: Cardiovascular;  Laterality: Right;  . NM MYOCAR PERF WALL MOTION   06/05/2010    no significant ischemia  . PERIPHERAL VASCULAR ATHERECTOMY Right 08/03/2018    Procedure: PERIPHERAL VASCULAR ATHERECTOMY;  Surgeon: Lorretta Harp, MD;  Location: [place] [facility];  Service: Cardiovascular;   Laterality: Right;  . stent   06/14/2010    left leg  . US ECHOCARDIOGRAPHY   01/21/2006    moderate mitral annular ca+, mild MR, AOV moderately sclerotic.        Family History  Problem Relation Age of Onset  . Cancer Mother    . Heart attack Father    . Stroke Father    . Heart attack Brother    . Heart attack Brother     Social History:  reports that he quit smoking about 83 years ago. He has quit using smokeless tobacco.  His smokeless tobacco use included chew. He reports that he does not drink alcohol or use drugs. Allergies:       Allergies  Allergen Reactions  . Iodinated Diagnostic Agents        Other reaction(s): Fever  . Iodine-131 Other (See Comments)      Breakout, fever  . Iohexol         Code: RASH, Desc: PT STATES HOT ALL OVER HAD TO HAVE ICE PACKS ALL OVER HIM AND DIFF BREATHING, PT REFUSED IV DYE              Facility-Administered Medications Prior to Admission  Medication Dose Route Frequency Provider Last Rate Last Dose  . 0.9 %  sodium chloride infusion  250 mL Intravenous PRN Lorretta Harp, MD              Medications Prior to Admission  Medication Sig Dispense Refill  . apixaban (ELIQUIS) 2.5 MG TABS tablet Take 1 tablet (2.5 mg total) by mouth 2 (two) times daily. 180 tablet 3  . [EXPIRED] aspirin EC 81 MG EC tablet Take 1 tablet (81 mg total) by mouth daily. Stop taking aspirin 09/04/2018. 30 tablet 0  . atorvastatin (LIPITOR) 40 MG tablet TAKE 1 TABLET BY MOUTH ONCE DAILY (Patient taking differently: Take 40 mg by mouth daily after supper. ) 90 tablet 3  . calcium carbonate (TUMS - DOSED IN MG ELEMENTAL CALCIUM) 500 MG chewable tablet Chew 1 tablet by mouth as needed for indigestion or heartburn.      . clopidogrel (PLAVIX) 75 MG tablet Take 1 tablet (75 mg total) by mouth daily with breakfast. 90 tablet 3  . docusate sodium (COLACE) 100 MG capsule Take 100 mg by mouth 2 (two) times daily as needed (for use with oxycodone to prevent constipation.).       [name] dorzolamide-timolol (COSOPT) 22.3-6.8 MG/ML ophthalmic solution Place 1 drop into both eyes 2 (two) times daily.      [name] doxazosin (CARDURA) 2 MG tablet Take 1 tablet (2 mg total) by mouth at bedtime. 90 tablet 3  . finasteride (PROSCAR) 5 MG tablet Take 5 mg by mouth every evening.       . furosemide (LASIX) 40 MG tablet TAKE 1 TABLET(40 MG) BY MOUTH DAILY (Patient taking differently: Take 40 mg by mouth daily. TAKE 1 TABLET(40 MG) BY MOUTH DAILY) 90 tablet 3  . glipiZIDE (GLUCOTROL) 5 MG tablet Take 5 mg by mouth 2 (two) times daily.       [name]  isosorbide mononitrate (IMDUR) 30 MG 24 hr tablet Take 0.5 tablets (15 mg total) by mouth daily. 15 tablet 3  . levothyroxine (SYNTHROID, LEVOTHROID) 50 MCG tablet Take 50 mcg by mouth daily before breakfast.    0  . metoprolol tartrate (LOPRESSOR) 25 MG tablet Take 12.5 mg as needed for palpitations/heart racing (Patient taking differently: Take 12.5 mg by mouth daily as needed (palpitations/heart racing). Take 12.5 mg as needed for palpitations/heart racin) 30 tablet 1  . Omega-3 Fatty Acids (FISH OIL PO) Take 1 capsule by mouth daily.      [name] oxyCODONE-acetaminophen (PERCOCET) 5-325 MG tablet Take 1 tablet by mouth every 8 (eight) hours as needed for severe pain. 15 tablet 0  . pyridOXINE (VITAMIN B-6) 100 MG tablet Take 100 mg by mouth daily.      . tamsulosin (FLOMAX) 0.4 MG CAPS Take 0.4 mg by mouth 2 (two) times daily.       . vitamin C (ASCORBIC ACID) 500 MG tablet Take 500 mg by mouth daily.      . vitamin E 400 UNIT capsule Take 400 Units by mouth daily.      . diphenhydrAMINE (BENADRYL) 50 MG tablet PLEASE FOLLOW THE INSTRUCTIONS ON YOUR AFTER VISIT SUMMARY (Patient not taking: Reported on 08/20/2018) 1 tablet 0  . oxyCODONE-acetaminophen (PERCOCET) 5-325 MG tablet Take 1-2 tablets by mouth every 8 (eight) hours as needed for severe pain. (Patient not taking: Reported on 08/20/2018) 20 tablet 0     Drug Regimen Review Drug regimen was reviewed  and remains appropriate with no significant issues identified   Home: Home Living Family/patient expects to be discharged to:: Inpatient rehab Living Arrangements: Alone Additional Comments: pt lived alone and was completely indep up until 2 weeks ago   Functional History: Prior Function Level of Independence: Independent Comments: was driving, grocery shopping and amb without AD up until 2 weeks ago   Functional Status:  Mobility: Bed Mobility Overal bed mobility: Needs Assistance Bed Mobility: Supine to Sit Rolling: Min guard Sidelying to sit: Min assist, HOB elevated Supine to sit: Supervision, Modified independent (Device/Increase time) Sit to supine: Max assist General bed mobility comments: Use of hand rail to assist with mobility, increased time Transfers Overall transfer level: Needs assistance Equipment used: Rolling walker (2 wheeled) Transfer via Lift Equipment: Stedy Transfers: Sit to/from [employer] to Stand: Min assist, From elevated surface  Lateral/Scoot Transfers: Min assist, +2 physical assistance General transfer comment: patient able to pivot in standing with RW from bed to recliner toward left side. Min assist Ambulation/Gait Ambulation/Gait assistance: Min Web designer (Feet): 2 Feet Assistive device: Rolling walker (2 wheeled) General Gait Details: pivot to chair only, unable to hop     ADL: ADL Overall ADL's : Needs assistance/impaired Grooming: Min guard, Sitting Upper Body Bathing: Min guard, Sitting Lower Body Bathing: Moderate assistance, Sitting/lateral leans Upper Body Dressing : Min guard, Sitting Lower Body Dressing: Total assistance, Sit to/from stand Lower Body Dressing Details (indicate cue type and reason): unable to pull sock up in long leg sitting in recliner Toilet Transfer: Requires drop arm, Minimal assistance, +2 for physical assistance Toilet Transfer Details (indicate cue type and reason): simulated to drop arm  recliner  Toileting- Clothing Manipulation and Hygiene: Moderate assistance, Sitting/lateral lean Functional mobility during ADLs: Cueing for sequencing, Cueing for safety, Minimal assistance General ADL Comments: pt limited by generalized weakness, decreased activity tolerance and impaired balance, verbal cues for sequencing and safety awareness   Cognition: Cognition Overall  Cognitive Status: Within Functional Limits for tasks assessed Orientation Level: Oriented X4 Cognition Arousal/Alertness: Awake/alert Behavior During Therapy: WFL for tasks assessed/performed Overall Cognitive Status: Within Functional Limits for tasks assessed Area of Impairment: Problem solving Memory: Decreased short-term memory Following Commands: Follows one step commands consistently, Follows one step commands with increased time Problem Solving: Slow processing, Requires verbal cues, Requires tactile cues General Comments: Slow processing/response time to commands; tactile cues   Physical Exam: Blood pressure (!) 143/64, pulse 60, temperature 97.9 F (36.6 C), temperature source Oral, resp. rate 16, height 5' 11.5" (1.816 m), weight 104 kg, SpO2 97 %. Physical Exam  Constitutional: He is oriented to person, place, and time. No distress.  HENT:  Head: Normocephalic and atraumatic.  Healing sore on lip  Eyes: Pupils are equal, round, and reactive to light. EOM are normal.  Neck: Normal range of motion. No tracheal deviation present. No thyromegaly present.  Cardiovascular: Normal rate.  Murmur heard. IRR IRR  Respiratory: Effort normal and breath sounds normal. No respiratory distress. He has no wheezes.  GI: Soft. He exhibits no distension. There is no abdominal tenderness.  Musculoskeletal: Normal range of motion.     Comments: Right leg in vac, some associated swelling  Neurological: He is alert and oriented to person, place, and time.  Patient is alert.  He is a bit hard of hearing.  Makes good eye  contact with examiner.  His son is at bedside.  He was able to provide his name and age as well as place.  He did have some difficulty for his hospital course. UE 5/5. RLE: can lift against gravity. LLE: 3/5 HF, 4/5 KE, ADF/PF. No focal sensory findings.   Skin: He is not diaphoretic.  BKA site is dressed with limb guard in place as well as wound VAC  Psychiatric: He has a normal mood and affect. His behavior is normal.      Lab Results Last 48 Hours        Results for orders placed or performed during the hospital encounter of 08/20/18 (from the past 48 hour(s))  Glucose, capillary     Status: Abnormal    Collection Time: 09/05/18 11:29 AM  Result Value Ref Range    Glucose-Capillary 148 (H) 70 - 99 mg/dL    Comment 1 Notify RN      Comment 2 Document in Chart    Glucose, capillary     Status: Abnormal    Collection Time: 09/05/18  3:47 PM  Result Value Ref Range    Glucose-Capillary 154 (H) 70 - 99 mg/dL    Comment 1 Notify RN      Comment 2 Document in Chart    Glucose, capillary     Status: Abnormal    Collection Time: 09/05/18  8:33 PM  Result Value Ref Range    Glucose-Capillary 177 (H) 70 - 99 mg/dL  Creatinine, serum     Status: Abnormal    Collection Time: 09/06/18  4:19 AM  Result Value Ref Range    Creatinine, Ser 1.10 0.61 - 1.24 mg/dL    GFR calc non Af Amer 59 (L) >60 mL/min    GFR calc Af Amer >60 >60 mL/min      Comment: Performed at [facility], [address], [place], [place] [address]  Glucose, capillary     Status: Abnormal    Collection Time: 09/06/18  7:18 AM  Result Value Ref Range    Glucose-Capillary 110 (H) 70 -  99 mg/dL  Glucose, capillary     Status: Abnormal    Collection Time: 09/06/18 11:13 AM  Result Value Ref Range    Glucose-Capillary 154 (H) 70 - 99 mg/dL  Glucose, capillary     Status: Abnormal    Collection Time: 09/06/18  4:11 PM  Result Value Ref Range    Glucose-Capillary 147 (H) 70 - 99 mg/dL  Glucose, capillary     Status:  Abnormal    Collection Time: 09/06/18  8:45 PM  Result Value Ref Range    Glucose-Capillary 164 (H) 70 - 99 mg/dL  Basic metabolic panel     Status: Abnormal    Collection Time: 09/07/18  3:44 AM  Result Value Ref Range    Sodium 130 (L) 135 - 145 mmol/L    Potassium 4.3 3.5 - 5.1 mmol/L    Chloride 100 98 - 111 mmol/L    CO2 19 (L) 22 - 32 mmol/L    Glucose, Bld 132 (H) 70 - 99 mg/dL    BUN 19 8 - 23 mg/dL    Creatinine, Ser 1.12 0.61 - 1.24 mg/dL    Calcium 8.3 (L) 8.9 - 10.3 mg/dL    GFR calc non Af Amer 58 (L) >60 mL/min    GFR calc Af Amer >60 >60 mL/min    Anion gap 11 5 - 15      Comment: Performed at [facility] [address] [place], [place] [address]  Glucose, capillary     Status: Abnormal    Collection Time: 09/07/18  7:45 AM  Result Value Ref Range    Glucose-Capillary 128 (H) 70 - 99 mg/dL     Imaging Results (Last 48 hours)  No results found.           Medical Problem List and Plan: 1.  Decreased functional mobility secondary to right transtibial amputation 08/28/2018 as well as recent right lower extremity revascularization procedure.               -admit to inpatient rehab             -continue Wound VAC as directed (he is 10 days post-op)--dc soon?--check with ortho 2.  Antithrombotics: -DVT/anticoagulation: Lovenox             -antiplatelet therapy: Aspirin 81 mg daily, Plavix 75 mg daily 3. Pain Management: Robaxin and oxycodone as needed. 4. Mood: Provide emotional support             -antipsychotic agents: N/A 5. Neuropsych: This patient is capable of making decisions on his own behalf. 6. Skin/Wound Care: continue prevena vac per ortho. He is POD #10---dc soon? 7. Fluids/Electrolytes/Nutrition: Routine in and outs with follow-up chemistries on admit.  8.ABLA.  Follow-up CBC 9.  Hypertension.  Cardura 2 mg nightly.  Monitor with increased mobility 10.  Diabetes mellitus, hemoglobin A1c 7.0.  SSI.  Check blood sugars before meals and at  bedtime.  Lantus insulin 10 units daily.  Patient on Glucotrol 5 mg twice daily prior to admission.              - Resume glucotrol as needed             -fair control at present. 11.  BPH.  Proscar 5 mg daily, Flomax 0.4 mg twice daily.  Check PVR 12.  Hypothyroidism.  Synthroid 13.  Constipation.  Laxative assistance 14.  Hyperlipidemia.  Lipitor   Post Admission Physician Evaluation: 1. Functional deficits secondary  to right bka. 2. Patient is admitted to receive collaborative, interdisciplinary care between the physiatrist, rehab nursing staff, and therapy team. 3. Patient's level of medical complexity and substantial therapy needs in context of that medical necessity cannot be provided at a lesser intensity of care such as a SNF. 4. Patient has experienced substantial functional loss from his/her baseline which was documented above under the "Functional History" and "Functional Status" headings.  Judging by the patient's diagnosis, physical exam, and functional history, the patient has potential for functional progress which will result in measurable gains while on inpatient rehab.  These gains will be of substantial and practical use upon discharge  in facilitating mobility and self-care at the household level. 5. Physiatrist will provide 24 hour management of medical needs as well as oversight of the therapy plan/treatment and provide guidance as appropriate regarding the interaction of the two. 6. The Preadmission Screening has been reviewed and patient status is unchanged unless otherwise stated above. 7. 24 hour rehab nursing will assist with bladder management, bowel management, safety, skin/wound care, disease management, medication administration, pain management and patient education  and help integrate therapy concepts, techniques,education, etc. 8. PT will assess and treat for/with: Lower extremity strength, range of motion, stamina, balance, functional mobility, safety, adaptive  techniques and equipment, pre-prosthetic education.   Goals are: min assist. 9. OT will assess and treat for/with: ADL's, functional mobility, safety, upper extremity strength, adaptive techniques and equipment, pre-prosthetic education.   Goals are: min assist. Therapy may proceed with showering this patient. 10. SLP will assess and treat for/with: n/a.  Goals are: n/a. 11. Case Management and Social Worker will assess and treat for psychological issues and discharge planning. 12. Team conference will be held weekly to assess progress toward goals and to determine barriers to discharge. 13. Patient will receive at least 3 hours of therapy per day at least 5 days per week. 14. ELOS: 10-14 days       15. Prognosis:  excellent   I have personally performed a face to face diagnostic evaluation of this patient and formulated the key components of the plan.  Additionally, I have personally reviewed laboratory data, imaging studies, as well as relevant notes and concur with the physician assistant's documentation above.  Meredith Staggers, MD, FAAPMR     Lavon Paganini Wiley, PA-C 09/07/2018

## 2018-09-07 NOTE — PMR Pre-admission (Signed)
PMR Admission Coordinator Pre-Admission Assessment  Patient: [name] is an 85 y.o., male MRN: [id number] DOB: 30-Jan-1927 Height: 5' 11.5" (181.6 cm) Weight: 104 kg  Insurance Information HMO: yes   PPO:      PCP:      IPA:      80/20:      OTHER:  PRIMARY: U[employer] Medicare      Policy#: [id number]      Subscriber: Patient CM Name: Watt Climes      Phone#: [phone] [id number]     Fax#: [phone] Pre-Cert#: [id number]      Employer:  [employer]m Kaufmann provided by Watt Climes for admit to CIR on 09/07/2018. Auth: [id number]. Pt is approved for 7 days. Follow up CM is Princess (p): [phone]; (f): [phone] Benefits:  Phone #: online     Name: [email or web address] Eff. Date: 01/14/2018 still active     Deduct: $0      Out of Pocket Max: $3,600 (met [phone] met)      Life Max: NA CIR: $295/day co-pay for days 1-5, $0/day co-pay for days 6+      SNF: $0/day co-pay for days 1-20, $160/day co-pay for days 21-43, $0/day co pay for days 44-100; limited to 100 days/cal yr Outpatient: limited by medical necessity     Co-Pay: $30/visit Home Health: 100%; limited by medical necessity      Co-Pay: 0% co-insurance DME: 80%     Co-Pay: 20% co-insurance  Providers:  SECONDARY: None      Policy#:       Subscriber:  CM Name:       Phone#:      Fax#:  Pre-Cert#:       Employer:  Benefits:  Phone #:      Name:  Eff. Date:      Deduct:      Out of Pocket Max:       Life Max:  CIR:       SNF:  Outpatient:      Co-Pay:  Home Health:       Co-Pay:  DME:      Co-Pay:   Medicaid Application Date:       Case Manager:  Disability Application Date:       Case Worker:   The "Data Collection Information Summary" for patients in Inpatient Rehabilitation Facilities with attached "Privacy Act [place] Records" was provided and verbally reviewed with: Patient and Family  Emergency Contact Information Contact Information    Name Relation Home Work [place] Daughter [phone]  [phone]   Myers,Betty Sister [phone]     Channing, Yeager   [phone]   Gilverto, Dileonardo   [phone]      Current Medical History  Patient Admitting Diagnosis: Right BKA  History of Present Illness: [name] is an 85 year old male with history of diabetes mellitus, hypertension, hyperlipidemia, peripheral vascular disease maintained on aspirin and Plavix with recent revascularization procedure right lower extremity. Presented 08/24/2018 with gangrenous right foot and toe that has progressed over the past month.  Limb was not felt to be salvageable and underwent right transtibial amputation 08/28/2018 per Dr. Sharol Given and application of wound VAC.  Splint and shrinker provided by [employer] prosthetics.  Hospital course pain management.  Subcutaneous Lovenox for DVT prophylaxis.  Acute blood loss anemia7-6 8.2 and has received 2 units packed red blood cells with mild leukocytosis 12,500.  Mild hyponatremia 128 monitored.  Intermittent bouts of confusion felt to be related to narcotics/multifactorial.  Palliative care did follow-up to establish goals of care.  Therapy evaluation completed and patient is to be admitted for a comprehensive rehab program on 09/07/2018    Patient's medical record from [facility] has been reviewed by the rehabilitation admission coordinator and physician.  Past Medical History  Past Medical History:  Diagnosis Date  . Arthritis   . Borderline diabetic   . Glaucoma   . Hyperlipidemia   . Hypertension   . PVD (peripheral vascular disease) ([address])   . Sleep apnea    Cpap ordered but doesnt use    Family History   family history includes Cancer in his mother; Heart attack in his brother, brother, and father; Stroke in his father.  Prior Rehab/Hospitalizations Has the patient had prior rehab or hospitalizations prior to admission? Yes  Has the patient had major surgery during 100 days prior to admission? Yes   Current Medications  Current  Facility-Administered Medications:  .  0.9 %  sodium chloride infusion, 250 mL, Intravenous, PRN, [name], MD .  0.9 %  sodium chloride infusion, , Intravenous, Continuous, [name], MD .  acetaminophen (TYLENOL) tablet 325-650 mg, 325-650 mg, Oral, Q6H PRN, [name], MD, 650 mg at 09/03/18 2303 .  albuterol (PROVENTIL) (2.5 MG/3ML) 0.083% nebulizer solution 2.5 mg, 2.5 mg, Nebulization, Q6H PRN, Swayze, Ava, DO .  atorvastatin (LIPITOR) tablet 40 mg, 40 mg, Oral, QPC supper, [name], MD, 40 mg at 09/06/18 1900 .  bisacodyl (DULCOLAX) suppository 10 mg, 10 mg, Rectal, Daily PRN, [name], MD .  calcium carbonate (TUMS - dosed in mg elemental calcium) chewable tablet 200 mg of elemental calcium, 1 tablet, Oral, PRN, [name], MD, 200 mg of elemental calcium at 08/24/18 0042 .  clopidogrel (PLAVIX) tablet 75 mg, 75 mg, Oral, Daily, [name], MD, 75 mg at 09/07/18 0953 .  docusate sodium (COLACE) capsule 100 mg, 100 mg, Oral, BID PRN, [name], MD, 100 mg at 08/28/18 1734 .  docusate sodium (COLACE) capsule 100 mg, 100 mg, Oral, BID, [name], MD, 100 mg at 09/07/18 0953 .  dorzolamide-timolol (COSOPT) 22.3-6.8 MG/ML ophthalmic solution 1 drop, 1 drop, Both Eyes, BID, [name], MD, 1 drop at 09/06/18 2149 .  doxazosin (CARDURA) tablet 2 mg, 2 mg, Oral, QHS, [name], MD, 2 mg at 09/06/18 2149 .  enoxaparin (LOVENOX) injection 40 mg, 40 mg, Subcutaneous, Q24H, [name], MD, 40 mg at 09/07/18 0955 .  famotidine (PEPCID) tablet 20 mg, 20 mg, Oral, BID, [name], MD, 20 mg at 09/07/18 0953 .  finasteride (PROSCAR) tablet 5 mg, 5 mg, Oral, QPM, [name], MD, 5 mg at 09/06/18 1859 .  HYDROmorphone (DILAUDID) injection 0.5-1 mg, 0.5-1 mg, Intravenous, Q4H PRN, [name], MD, 0.5 mg at 09/02/18 1309 .  insulin aspart (novoLOG) injection 0-9 Units, 0-9 Units, Subcutaneous, TID WC, [name], MD, 1 Units at 09/07/18  [phone] .  insulin glargine (LANTUS) injection 10 Units, 10 Units, Subcutaneous, Daily, Darliss Cheney, MD, 10 Units at 09/07/18 0954 .  levothyroxine (SYNTHROID) tablet 50 mcg, 50 mcg, Oral, Q0600, [name], MD, 50 mcg at 09/07/18 0523 .  magnesium citrate solution 1 Bottle, 1 Bottle, Oral, Once PRN, [name], MD .  methocarbamol (ROBAXIN) tablet 500 mg, 500 mg, Oral, Q6H PRN, 500 mg at 08/30/18 2142 **OR** methocarbamol (ROBAXIN) 500 mg in dextrose 5 % 50  mL IVPB, 500 mg, Intravenous, Q6H PRN, [name], MD .  metoCLOPramide (REGLAN) tablet 5-10 mg, 5-10 mg, Oral, Q8H PRN **OR** metoCLOPramide (REGLAN) injection 5-10 mg, 5-10 mg, Intravenous, Q8H PRN, [name], MD .  ondansetron (ZOFRAN) tablet 4 mg, 4 mg, Oral, Q6H PRN **OR** ondansetron (ZOFRAN) injection 4 mg, 4 mg, Intravenous, Q6H PRN, [name], MD .  oxyCODONE (Oxy IR/ROXICODONE) immediate release tablet 10-15 mg, 10-15 mg, Oral, Q4H PRN, [name], MD, 15 mg at 09/05/18 0233 .  oxyCODONE (Oxy IR/ROXICODONE) immediate release tablet 5-10 mg, 5-10 mg, Oral, Q4H PRN, [name], MD, 10 mg at 08/28/18 1604 .  oxyCODONE-acetaminophen (PERCOCET/ROXICET) 5-325 MG per tablet 1 tablet, 1 tablet, Oral, Q8H PRN, [name], MD, 1 tablet at 08/29/18 2217 .  polyethylene glycol (MIRALAX / GLYCOLAX) packet 17 g, 17 g, Oral, Daily PRN, [name], MD .  polyethylene glycol (MIRALAX / GLYCOLAX) packet 17 g, 17 g, Oral, Daily, Vasireddy, Grier Mitts, MD, 17 g at 09/06/18 1034 .  pyridOXINE (VITAMIN B-6) tablet 100 mg, 100 mg, Oral, Daily, [name], MD, 100 mg at 09/07/18 0954 .  sodium chloride flush (NS) 0.9 % injection 3 mL, 3 mL, Intravenous, Q12H, [name], MD, 3 mL at 09/06/18 2151 .  sodium chloride flush (NS) 0.9 % injection 3 mL, 3 mL, Intravenous, PRN, [name], MD .  tamsulosin [facility]) capsule 0.4 mg, 0.4 mg, Oral, BID, [name], MD, 0.4 mg at 09/07/18 6295 .  vitamin C (ASCORBIC ACID)  tablet 500 mg, 500 mg, Oral, Daily, [name], MD, 500 mg at 09/07/18 2841 .  vitamin E capsule 400 Units, 400 Units, Oral, Daily, [name], MD, 400 Units at 09/07/18 3244  Patients Current Diet:  Diet Order            Diet Carb Modified Fluid consistency: Thin; Room service appropriate? No  Diet effective now              Precautions / Restrictions Precautions Precautions: Fall Precaution Comments: pt with R BKA and splint/shrinker from [employer] Other Brace: splint and shrinker from [employer] Restrictions Weight Bearing Restrictions: Yes RLE Weight Bearing: Non weight bearing   Has the patient had 2 or more falls or a fall with injury in the past year? No  Prior Activity Level Limited community (1-2x/wk): pt would go to appointments only, wound care (all since May/June since infection in toe). Was much more active prior to infection, farmed, yardwork.   Prior Functional Level Self Care: Did the patient need help bathing, dressing, using the toilet or eating? Needed some help (set up, Min A to dress).   Indoor Mobility: Did the patient need assistance with walking from room to room (with or without device)? Needed some help (min A/Mod A recently since infection began).   Stairs: Did the patient need assistance with internal or external stairs (with or without device)? Unknown  Functional Cognition: Did the patient need help planning regular tasks such as shopping or remembering to take medications? Needed some help (supervision)  Home Assistive Devices / [place] Devices/Equipment: None  Prior Device Use: Indicate devices/aids used by the patient prior to current illness, exacerbation or injury? Manual wheelchair and Walker  Current Functional Level Cognition  Overall Cognitive Status: Within Functional Limits for tasks assessed Orientation Level: Oriented X4 Following Commands: Follows one step commands consistently, Follows one step commands with  increased time General Comments:  Slow processing/response time to commands; tactile cues    Extremity Assessment (includes Sensation/Coordination)  Upper Extremity Assessment: Generalized weakness  Lower Extremity Assessment: Defer to PT evaluation RLE Deficits / Details: knee in splint but was able to initiate knee flexion AA when splint removed, able to intiated SLF with splint on LLE Deficits / Details: able to intiate knee to chest and ankle pumps    ADLs  Overall ADL's : Needs assistance/impaired Grooming: Min guard, Sitting Upper Body Bathing: Min guard, Sitting Lower Body Bathing: Moderate assistance, Sitting/lateral leans Upper Body Dressing : Min guard, Sitting Lower Body Dressing: Total assistance, Sit to/from stand Lower Body Dressing Details (indicate cue type and reason): unable to pull sock up in long leg sitting in recliner Toilet Transfer: Requires drop arm, Minimal assistance, +2 for physical assistance Toilet Transfer Details (indicate cue type and reason): simulated to drop arm recliner  Toileting- Clothing Manipulation and Hygiene: Moderate assistance, Sitting/lateral lean Functional mobility during ADLs: Cueing for sequencing, Cueing for safety, Minimal assistance General ADL Comments: pt limited by generalized weakness, decreased activity tolerance and impaired balance, verbal cues for sequencing and safety awareness    Mobility  Overal bed mobility: Needs Assistance Bed Mobility: Supine to Sit Rolling: Min guard Sidelying to sit: Min assist, HOB elevated Supine to sit: Supervision, Modified independent (Device/Increase time) Sit to supine: Max assist General bed mobility comments: Use of hand rail to assist with mobility, increased time    Transfers  Overall transfer level: Needs assistance Equipment used: Rolling walker (2 wheeled) Transfer via Lift Equipment: Stedy Transfers: Sit to/from [employer] to Stand: Min assist, From elevated surface   Lateral/Scoot Transfers: Min assist, +2 physical assistance General transfer comment: patient able to pivot in standing with RW from bed to recliner toward left side. Min assist    Ambulation / Gait / Stairs / Emergency planning/management officer  Ambulation/Gait Ambulation/Gait assistance: Herbalist (Feet): 2 Feet Assistive device: Rolling walker (2 wheeled) General Gait Details: pivot to chair only, unable to hop    Posture / Balance Dynamic Sitting Balance Sitting balance - Comments: Requires UE support for sitting balance, close min guard. Balance Overall balance assessment: Needs assistance Sitting-balance support: Feet supported Sitting balance-Leahy Scale: Good Sitting balance - Comments: Requires UE support for sitting balance, close min guard. Postural control: Posterior lean Standing balance support: Bilateral upper extremity supported Standing balance-Leahy Scale: Fair Standing balance comment: requires B UE support on RW and physical assist with cues for upright, hip extension and BUEs extension. Stood for ~30-40 seconds x 2 reps.    Special needs/care consideration BiPAP/CPAP : no CPM : no Continuous Drip IV : 0.9% sodium chloride infusion  Dialysis : no        Days : no Life Vest : no Oxygen : no Special Bed : no Trach Size : no Wound Vac (area) : yes      Location : RLE to surgical site Skin: amputation to RLE surgical incision, rash to back, skin tear to penis              Bowel mgmt: last BM: 09/07/2018; continent Bladder mgmt: incontinence, external catheter Diabetic mgmt: yes Behavioral consideration : no Chemo/radiation : no   Previous Home Environment (from acute therapy documentation) Living Arrangements: [place]: No Additional Comments: pt lived alone and was completely indep up until 2 weeks ago  Discharge Living Setting Plans for Discharge Living Setting: Patient's home, Alone, House(will have 24/7 family assist at DC  per son and  daugther) Type of Home at Discharge: House Discharge Home Layout: One level Discharge Home Access: [place] entrance Discharge Bathroom Shower/Tub: Walk-in shower Discharge Bathroom Toilet: Handicapped height Discharge Bathroom Accessibility: Yes How Accessible: Accessible via walker Does the patient have any problems obtaining your medications?: No  Social/Family/Support Systems Patient Roles: Other (Comment)(has multiple family members who [place]) Contact Information: [name] (daughter): [phone]; [name] (son): [phone] Anticipated Caregiver: son and daugther and other family members to [place] in as needed Anticipated Caregiver's Contact Information: see above Ability/Limitations of Caregiver: Min A Caregiver Availability: 24/7 Discharge Plan Discussed with Primary Caregiver: Yes Is Caregiver In Agreement with Plan?: Yes Does Caregiver/Family have Issues with Lodging/Transportation while Pt is in Rehab?: No  Goals/Additional Needs Patient/Family Goal for Rehab: PT/OT: Min A; SLP: NA Expected length of stay: 10-14 days Cultural Considerations: Christian Dietary Needs: carb modified, thin liquids; calorie level 1600-2000. Room service not appropriate.  Equipment Needs: TBD Pt/Family Agrees to Admission and willing to participate: Yes Program Orientation Provided & Reviewed with Pt/Caregiver Including Roles  & Responsibilities: Yes(pt, daughter, and son)  Barriers to Discharge: Home environment access/layout, Weight bearing restrictions  Barriers to Discharge Comments: bathroom may not be wc accessible.   Decrease burden of Care through IP rehab admission: MA  Possible need for SNF placement upon discharge: Not anticipated. Pt has confirmed 24/7 A at DC between his grown children. They understand anticipated assist level at DC. Pt has ramped entrance for accessibility at DC.   Patient Condition: I have reviewed medical records from [facility],  spoken with CM, and patient, son and daughter. I met with patient at the bedside for inpatient rehabilitation assessment.  Patient will benefit from ongoing PT and OT, can actively participate in 3 hours of therapy a day 5 days of the week, and can make measurable gains during the admission.  Patient will also benefit from the coordinated team approach during an Inpatient Acute Rehabilitation admission.  The patient will receive intensive therapy as well as Rehabilitation physician, nursing, social worker, and care management interventions.  Due to bladder management, bowel management, safety, skin/wound care, disease management, medication administration, pain management and patient education the patient requires 24 hour a day rehabilitation nursing.  The patient is currently Min A for 2 feet with RW with mobility and Min Ax2 to total A for basic ADLs.  Discharge setting and therapy post discharge at home with home health is anticipated.  Patient has agreed to participate in the Acute Inpatient Rehabilitation Program and will admit today.  Preadmission Screen Completed By:  [name], 09/07/2018 1:00 PM ______________________________________________________________________   Discussed status with Dr. Naaman Plummer on 09/07/2018 at 12:34PM and received approval for admission today.  Admission Coordinator:  [name], OT, time 12:34PM/Date 09/07/2018   Assessment/Plan: Diagnosis: right BKA 1. Does the need for close, 24 hr/day Medical supervision in concert with the patient's rehab needs make it unreasonable for this patient to be served in a less intensive setting? Yes 2. Co-Morbidities requiring supervision/potential complications: DM, HTN, PVD 3. Due to bladder management, bowel management, safety, skin/wound care, disease management, medication administration, pain management and patient education, does the patient require 24 hr/day rehab nursing? Yes 4. Does the patient require coordinated care of a  physician, rehab nurse, PT (1-2 hrs/day, 5 days/week) and OT (1-2 hrs/day, 5 days/week) to address physical and functional deficits in the context of the above medical diagnosis(es)? Yes Addressing deficits in the following areas: balance, endurance,  locomotion, strength, transferring, bowel/bladder control, bathing, dressing, feeding, grooming, toileting and psychosocial support 5. Can the patient actively participate in an intensive therapy program of at least 3 hrs of therapy 5 days a week? Yes 6. The potential for patient to make measurable gains while on inpatient rehab is excellent 7. Anticipated functional outcomes upon discharge from inpatients are: min assist PT, min assist OT, n/a SLP 8. Estimated rehab length of stay to reach the above functional goals is: 10-14 days 9. Anticipated D/C setting: Home 10. Anticipated post D/C treatments: [place] therapy 11. Overall Rehab/Functional Prognosis: excellent  MD Signature: Meredith Staggers, MD, [place] [facility] 09/07/2018

## 2018-09-07 NOTE — NC FL2 (Signed)
[place] LEVEL OF CARE SCREENING TOOL     IDENTIFICATION  Patient Name: [name] Birthdate: 11/13/1927 Sex: male Admission Date (Current Location): 08/20/2018  [facility] and [place] Number:  Herbalist and Address:  [facility], [place] [address] [place], [place] [address]      Provider Number: [id number]  Attending Physician Name and Address:  Karie Kirks, DO  Relative Name and Phone Number:  [name]; daughter, [phone]    Current Level of Care: Hospital Recommended Level of Care: [place] Prior Approval Number:    Date Approved/Denied:   PASRR Number: [id number]  Discharge Plan: SNF    Current Diagnoses: Patient Active Problem List   Diagnosis Date Noted  . Gangrene of right foot ([place])   . DNR (do not resuscitate) discussion   . Goals of care, counseling/discussion   . Palliative care by specialist   . Type 2 diabetes mellitus with foot ulcer and gangrene ([place])   . Severe protein-calorie malnutrition ([place])   . Ischemic foot 08/20/2018  . Critical lower limb ischemia 08/03/2018  . Type 2 diabetes mellitus with foot ulcer, without long-term current use of insulin ([place])   . Gastroesophageal reflux disease   . Cellulitis of great toe of right foot 05/29/2018  . Atrial fibrillation, chronic   . Chest pain 07/25/2017  . PAF (paroxysmal atrial fibrillation) ([place]) 07/25/2017  . BPH (benign prostatic hyperplasia) 07/25/2017  . Shortness of breath 05/11/2017  . Hypothyroidism 05/11/2017  . Chronic diastolic CHF (congestive heart failure) ([place]) 05/11/2017  . Aortic valve stenosis 06/12/2016  . RBBB 06/12/2016  . PAD (peripheral artery disease) ([place]) 08/04/2012  . Essential hypertension 08/04/2012  . Hyperlipidemia 08/04/2012  . Dizziness 08/04/2012    Orientation RESPIRATION BLADDER Height & Weight     Self, Time, Situation, Place  Normal External catheter, Incontinent(placed 08/24/18) Weight:  229 lb 4.5 oz (104 kg) Height:  5' 11.5" (181.6 cm)  BEHAVIORAL SYMPTOMS/MOOD NEUROLOGICAL BOWEL NUTRITION STATUS      Continent Diet(carb modified)  AMBULATORY STATUS COMMUNICATION OF NEEDS Skin   Limited Assist Verbally Surgical wounds, Wound Vac(closed incision, right let, guaze dressing. open wound on foot.wound vac on right knee)                       Personal Care Assistance Level of Assistance  Dressing, Feeding, Bathing Bathing Assistance: Limited assistance Feeding assistance: Independent Dressing Assistance: Limited assistance     Functional Limitations Info  Sight, Hearing, Speech Sight Info: Adequate Hearing Info: Adequate Speech Info: Adequate    SPECIAL CARE FACTORS FREQUENCY  PT (By licensed PT), OT (By licensed OT)     PT Frequency: 5x OT Frequency: 5x            Contractures Contractures Info: Not present    Additional Factors Info  Code Status, Allergies Code Status Info: Full Code Allergies Info: Iodinated Diagnostic Agents, Iodine-131, Iohexol   Insulin Sliding Scale Info: insulin aspart (novoLOG) injection 0-9 Units Dose: 0-9 UnitsFreq: 3 times daily with meals       Current Medications (09/07/2018):  This is the current hospital active medication list Current Facility-Administered Medications  Medication Dose Route Frequency Provider Last Rate Last Dose  . 0.9 %  sodium chloride infusion  250 mL Intravenous PRN [name], MD      . 0.9 %  sodium chloride infusion   Intravenous Continuous [name], MD      .  acetaminophen (TYLENOL) tablet 325-650 mg  325-650 mg Oral Q6H PRN [name], MD   650 mg at 09/03/18 2303  . albuterol (PROVENTIL) (2.5 MG/3ML) 0.083% nebulizer solution 2.5 mg  2.5 mg Nebulization Q6H PRN Swayze, Ava, DO      . atorvastatin (LIPITOR) tablet 40 mg  40 mg Oral QPC supper [name], MD   40 mg at 09/06/18 1900  . bisacodyl (DULCOLAX) suppository 10 mg  10 mg Rectal Daily PRN [name], MD      .  calcium carbonate (TUMS - dosed in mg elemental calcium) chewable tablet 200 mg of elemental calcium  1 tablet Oral PRN [name], MD   200 mg of elemental calcium at 08/24/18 0042  . clopidogrel (PLAVIX) tablet 75 mg  75 mg Oral Daily [name], MD   75 mg at 09/07/18 0953  . docusate sodium (COLACE) capsule 100 mg  100 mg Oral BID PRN [name], MD   100 mg at 08/28/18 1734  . docusate sodium (COLACE) capsule 100 mg  100 mg Oral BID [name], MD   100 mg at 09/07/18 0953  . dorzolamide-timolol (COSOPT) 22.3-6.8 MG/ML ophthalmic solution 1 drop  1 drop Both Eyes BID [name], MD   1 drop at 09/06/18 2149  . doxazosin (CARDURA) tablet 2 mg  2 mg Oral QHS [name], MD   2 mg at 09/06/18 2149  . enoxaparin (LOVENOX) injection 40 mg  40 mg Subcutaneous Q24H [name], MD   40 mg at 09/07/18 0955  . famotidine (PEPCID) tablet 20 mg  20 mg Oral BID [name], MD   20 mg at 09/07/18 0953  . finasteride (PROSCAR) tablet 5 mg  5 mg Oral QPM [name], MD   5 mg at 09/06/18 1859  . HYDROmorphone (DILAUDID) injection 0.5-1 mg  0.5-1 mg Intravenous Q4H PRN [name], MD   0.5 mg at 09/02/18 1309  . insulin aspart (novoLOG) injection 0-9 Units  0-9 Units Subcutaneous TID WC [name], MD   1 Units at 09/07/18 [phone]  . insulin glargine (LANTUS) injection 10 Units  10 Units Subcutaneous Daily Darliss Cheney, MD   10 Units at 09/07/18 0954  . levothyroxine (SYNTHROID) tablet 50 mcg  50 mcg Oral Q0600 [name], MD   50 mcg at 09/07/18 0523  . magnesium citrate solution 1 Bottle  1 Bottle Oral Once PRN [name], MD      . methocarbamol (ROBAXIN) tablet 500 mg  500 mg Oral Q6H PRN [name], MD   500 mg at 08/30/18 2142   Or  . methocarbamol (ROBAXIN) 500 mg in dextrose 5 % 50 mL IVPB  500 mg Intravenous Q6H PRN [name], MD      . metoCLOPramide (REGLAN) tablet 5-10 mg  5-10 mg Oral Q8H PRN [name], MD       Or  . metoCLOPramide  (REGLAN) injection 5-10 mg  5-10 mg Intravenous Q8H PRN [name], MD      . ondansetron [facility]) tablet 4 mg  4 mg Oral Q6H PRN [name], MD       Or  . ondansetron [facility]) injection 4 mg  4 mg Intravenous Q6H PRN [name], MD      . oxyCODONE (Oxy IR/ROXICODONE) immediate release tablet 10-15 mg  10-15 mg Oral Q4H PRN [name],  MD   15 mg at 09/05/18 0233  . oxyCODONE (Oxy IR/ROXICODONE) immediate release tablet 5-10 mg  5-10 mg Oral Q4H PRN [name], MD   10 mg at 08/28/18 1604  . oxyCODONE-acetaminophen (PERCOCET/ROXICET) 5-325 MG per tablet 1 tablet  1 tablet Oral Q8H PRN [name], MD   1 tablet at 08/29/18 2217  . polyethylene glycol (MIRALAX / GLYCOLAX) packet 17 g  17 g Oral Daily PRN [name], MD      . polyethylene glycol (MIRALAX / GLYCOLAX) packet 17 g  17 g Oral Daily Monica Becton, MD   17 g at 09/06/18 1034  . pyridOXINE (VITAMIN B-6) tablet 100 mg  100 mg Oral Daily [name], MD   100 mg at 09/07/18 0954  . sodium chloride flush (NS) 0.9 % injection 3 mL  3 mL Intravenous Q12H [name], MD   3 mL at 09/06/18 2151  . sodium chloride flush (NS) 0.9 % injection 3 mL  3 mL Intravenous PRN [name], MD      . tamsulosin [facility]) capsule 0.4 mg  0.4 mg Oral BID [name], MD   0.4 mg at 09/07/18 4656  . vitamin C (ASCORBIC ACID) tablet 500 mg  500 mg Oral Daily [name], MD   500 mg at 09/07/18 8127  . vitamin E capsule 400 Units  400 Units Oral Daily [name], MD   400 Units at 09/07/18 5170     Discharge Medications: Please see discharge summary for a list of discharge medications.  Relevant Imaging Results:  Relevant Lab Results:   Additional Information [id number]  Gerrianne Scale Ryder Man, LCSW

## 2018-09-07 NOTE — Progress Notes (Signed)
Inpatient Rehabilitation-Admissions Coordinator   [facility] has received insurance approval today for admit to CIR. AC has medical approval from Dr. [name]. Pt still wanting to pursue CIR. AC will plan for admit today. Will follow up with pt's family, RN, CM/SW regarding plan.   [name], OTR/L  Rehab Admissions Coordinator  [phone] 09/07/2018 11:53 AM

## 2018-09-07 NOTE — Progress Notes (Signed)
Pt admitted to room 4M05. Denies any pain or discomfort at this time. reveied fall and visitor policy with pt and family. Oriented to floor, room and call bell. No further questions at this time. Continue plan of care.  [name], RN

## 2018-09-08 ENCOUNTER — Inpatient Hospital Stay (HOSPITAL_COMMUNITY): Payer: Medicare Other

## 2018-09-08 ENCOUNTER — Encounter (HOSPITAL_COMMUNITY): Payer: Self-pay

## 2018-09-08 ENCOUNTER — Inpatient Hospital Stay (HOSPITAL_COMMUNITY): Payer: Medicare Other | Admitting: Occupational Therapy

## 2018-09-08 DIAGNOSIS — N184 Chronic kidney disease, stage 4 (severe): Secondary | ICD-10-CM | POA: Diagnosis present

## 2018-09-08 DIAGNOSIS — N183 Chronic kidney disease, stage 3 (moderate): Secondary | ICD-10-CM

## 2018-09-08 DIAGNOSIS — E1152 Type 2 diabetes mellitus with diabetic peripheral angiopathy with gangrene: Secondary | ICD-10-CM

## 2018-09-08 DIAGNOSIS — D62 Acute posthemorrhagic anemia: Secondary | ICD-10-CM | POA: Diagnosis present

## 2018-09-08 DIAGNOSIS — R2689 Other abnormalities of gait and mobility: Secondary | ICD-10-CM | POA: Diagnosis present

## 2018-09-08 LAB — CBC WITH DIFFERENTIAL/PLATELET
Abs Immature Granulocytes: 0.02 10*3/uL (ref 0.00–0.07)
Basophils Absolute: 0.1 10*3/uL (ref 0.0–0.1)
Basophils Relative: 1 %
Eosinophils Absolute: 0.4 10*3/uL (ref 0.0–0.5)
Eosinophils Relative: 5 %
HCT: 26.8 % — ABNORMAL LOW (ref 39.0–52.0)
Hemoglobin: 8.9 g/dL — ABNORMAL LOW (ref 13.0–17.0)
Immature Granulocytes: 0 %
Lymphocytes Relative: 46 %
Lymphs Abs: 3.2 10*3/uL (ref 0.7–4.0)
MCH: 31.8 pg (ref 26.0–34.0)
MCHC: 33.2 g/dL (ref 30.0–36.0)
MCV: 95.7 fL (ref 80.0–100.0)
Monocytes Absolute: 0.6 10*3/uL (ref 0.1–1.0)
Monocytes Relative: 8 %
Neutro Abs: 2.8 10*3/uL (ref 1.7–7.7)
Neutrophils Relative %: 40 %
Platelets: 276 10*3/uL (ref 150–400)
RBC: 2.8 MIL/uL — ABNORMAL LOW (ref 4.22–5.81)
RDW: 17.7 % — ABNORMAL HIGH (ref 11.5–15.5)
WBC: 7.1 10*3/uL (ref 4.0–10.5)
nRBC: 0 % (ref 0.0–0.2)

## 2018-09-08 LAB — COMPREHENSIVE METABOLIC PANEL
ALT: 38 U/L (ref 0–44)
AST: 36 U/L (ref 15–41)
Albumin: 2.5 g/dL — ABNORMAL LOW (ref 3.5–5.0)
Alkaline Phosphatase: 63 U/L (ref 38–126)
Anion gap: 7 (ref 5–15)
BUN: 16 mg/dL (ref 8–23)
CO2: 24 mmol/L (ref 22–32)
Calcium: 8.4 mg/dL — ABNORMAL LOW (ref 8.9–10.3)
Chloride: 101 mmol/L (ref 98–111)
Creatinine, Ser: 0.98 mg/dL (ref 0.61–1.24)
GFR calc Af Amer: >60 mL/min (ref >60)
GFR calc non Af Amer: >60 mL/min (ref >60)
Glucose, Bld: 118 mg/dL — ABNORMAL HIGH (ref 70–99)
Potassium: 3.8 mmol/L (ref 3.5–5.1)
Sodium: 132 mmol/L — ABNORMAL LOW (ref 135–145)
Total Bilirubin: 0.8 mg/dL (ref 0.3–1.2)
Total Protein: 5.6 g/dL — ABNORMAL LOW (ref 6.5–8.1)

## 2018-09-08 LAB — GLUCOSE, CAPILLARY
Glucose-Capillary: 116 mg/dL — ABNORMAL HIGH (ref 70–99)
Glucose-Capillary: 174 mg/dL — ABNORMAL HIGH (ref 70–99)
Glucose-Capillary: 131 mg/dL — ABNORMAL HIGH (ref 70–99)
Glucose-Capillary: 196 mg/dL — ABNORMAL HIGH (ref 70–99)

## 2018-09-08 NOTE — Progress Notes (Signed)
[facility] PHYSICAL MEDICINE & REHABILITATION PROGRESS NOTE   Subjective/Complaints: Pt reports he has no pain at all currently- ate all his breakfast- LBM was yesterday and denies constipation.  OT in room with pt- starting ADLs. Did sleep well, but reports nightmares- doesn't remember the dreams, but knows they were nightmares since woke up.     Objective:   No results found. Recent Labs    09/07/18 1604 09/08/18 0616  WBC 9.4 7.1  HGB 9.4* 8.9*  HCT 29.5* 26.8*  PLT 268 276   Recent Labs    09/07/18 0344 09/07/18 1604 09/08/18 0616  NA 130*  --  132*  K 4.3  --  3.8  CL 100  --  101  CO2 19*  --  24  GLUCOSE 132*  --  118*  BUN 19  --  16  CREATININE 1.12 1.13 0.98  CALCIUM 8.3*  --  8.4*    Intake/Output Summary (Last 24 hours) at 09/08/2018 1033 Last data filed at 09/08/2018 0537 Gross per 24 hour  Intake 120 ml  Output 600 ml  Net -480 ml     Physical Exam: Vital Signs Blood pressure (!) 161/66, pulse 63, temperature 97.7 F (36.5 C), temperature source Oral, resp. rate 18, height 5\' 11"  (1.803 m), weight 106 kg, SpO2 98 %.   Physical Exam Constitutional: He isoriented to person, place, and time.No distress. Sitting up in bed with OT at bedside, doing ADLs currently, appropriate, wearing eyeglasses. HENT:  Head:Normocephalicand atraumatic. Healing sore on lip-looks slightly better Eyes:Pupils are equal, round, and reactive to light.EOMare normal.  Neck:Normal range of motion.No tracheal deviationpresent. No thyromegalypresent.  Cardiovascular:Normal rate. Murmurheard. IRR IRR Respiratory:CTA B/L except 1 little wheeze heard on L base- same after a cough-  DQ:QIWL. NT, ND, (+)BS Musculoskeletal:Normal range of motion.  Comments: RLE in vac, some associated swelling; mild generalized muscle atrophy noted Neurological: He is alertand oriented to person, place, and time. Patient is alert. He is a bit hard of hearing.  Makes good eye contact with examiner.   UE 5/5. RLE: can lift against gravity. LLE: 3/5 HF, 4/5 KE, ADF/PF. No focal sensory findings.  Skin: He isnot diaphoretic. Mild-mod edema of RLE- low BKA -not mid-upper BKA BKA site is dressed with limb guard in place as well as wound VAC- small amount of drainage in VAC. Psychiatric: He has a normal mood and affect. Bright affect;      Assessment/Plan: 1. 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  Physiatrist is providing close team supervision and 24 hour management of active medical problems listed below.  Physiatrist and rehab team continue to assess barriers to discharge/monitor patient progress toward functional and medical goals  Care Tool:  Bathing              Bathing assist       Upper Body Dressing/Undressing Upper body dressing   What is the patient wearing?: Hospital gown only    Upper body assist      Lower Body Dressing/Undressing Lower body dressing            Lower body assist       Toileting Toileting    Toileting assist Assist for toileting: Minimal Assistance - Patient > 75%     Transfers Chair/bed transfer  Transfers assist  Locomotion Ambulation   Ambulation assist              Walk 10 feet activity   Assist           Walk 50 feet activity   Assist           Walk 150 feet activity   Assist           Walk 10 feet on uneven surface  activity   Assist           Wheelchair     Assist               Wheelchair 50 feet with 2 turns activity    Assist            Wheelchair 150 feet activity     Assist  CBG (last 3)  Recent Labs    09/07/18 1657 09/07/18 2101 09/08/18 0612  GLUCAP 155* 183* 116*           Blood pressure (!)  161/66, pulse 63, temperature 97.7 F (36.5 C), temperature source Oral, resp. rate 18, height 5\' 11"  (1.803 m), weight 106 kg, SpO2 98 %.  Medical Problem List and Plan: 1.Decreased functional mobilitysecondary to right transtibial amputation 08/28/2018 as well as recent right lower extremity revascularization procedure. -admit to inpatient rehab -continue Wound VAC as directed(he is 10 days post-op)--dc soon?--check with ortho  8/25- will call Ortho tomorrow- give them a chance to come by. 2. Antithrombotics: -DVT/anticoagulation:Lovenox -antiplatelet therapy: Aspirin 81 mg daily, Plavix 75 mg daily 3. Pain Management:Robaxin andoxycodoneas needed.  8/25- pt denies pain, but didn't say if b/c took meds. 4. Mood:Provide emotional support -antipsychotic agents: N/A 5. Neuropsych: This patientiscapable of making decisions on hisown behalf. 6. Skin/Wound Care:continue prevena vac per ortho. He is POD #10---dc soon? 7. Fluids/Electrolytes/Nutrition:Routine in and outs with follow-up chemistrieson admit.  8/25 Na slightly better from 130 to 132. 8.ABLA.Follow-up CBC  8/25- Drop from 9.4 to 8.9- will recheck Thursday. 9. Hypertension. Cardura 2 mg nightly. Monitor with increased mobility  8/25- BP spike this AM to 160s/66- will monitor 10. Diabetes mellitus, hemoglobin A1c 7.0. SSI. Check blood sugars before meals and at bedtime. Lantus insulin 10 units daily. Patient on Glucotrol 5 mg twice daily prior to admission.  -Resume glucotrol as needed -fair control at present.  8/25- [phone]- fairly well controlled 11. BPH. Proscar 5 mg daily,Flomax 0.4 mg twice daily. Check PVR 12. Hypothyroidism. Synthroid 13. Constipation. Laxative assistance  8/25- LBM yesterday 14.Hyperlipidemia. Lipitor    LOS: 1 days A FACE TO FACE EVALUATION WAS PERFORMED  Rayah Fines 09/08/2018, 10:33  AM

## 2018-09-08 NOTE — Progress Notes (Signed)
Patient information reviewed and entered into [facility] System by Becky Jackie Russman, PPS coordinator. Information including medical coding, function ability, and quality indicators will be reviewed and updated through discharge.   

## 2018-09-08 NOTE — Evaluation (Addendum)
Physical Therapy Assessment and Plan  Patient Details  Name: [name] MRN: [id number] Date of Birth: 1927-10-03  PT Diagnosis: Abnormal posture, Abnormality of gait, Coordination disorder, Difficulty walking, Edema, Impaired sensation, Muscle weakness and Pain in R LE Rehab Potential:   ELOS:     Today's Date: 09/08/2018 PT Individual Time: 0800-0930 and 1335-1440 PT Individual Time Calculation (min): 90 and 65 min    Problem List:  Patient Active Problem List   Diagnosis Date Noted  . Unable to maintain weight-bearing   . Type 2 diabetes mellitus with diabetic peripheral angiopathy with gangrene ([place])   . Anemia due to acute blood loss   . Stage 3 chronic kidney disease ([place])   . Right below-knee amputee ([place]) 09/07/2018  . Gangrene of right foot ([place])   . DNR (do not resuscitate) discussion   . Goals of care, counseling/discussion   . Palliative care by specialist   . Type 2 diabetes mellitus with foot ulcer and gangrene ([place])   . Severe protein-calorie malnutrition ([place])   . Ischemic foot 08/20/2018  . Critical lower limb ischemia 08/03/2018  . Type 2 diabetes mellitus with foot ulcer, without long-term current use of insulin ([place])   . Gastroesophageal reflux disease   . Cellulitis of great toe of right foot 05/29/2018  . Atrial fibrillation, chronic   . Chest pain 07/25/2017  . PAF (paroxysmal atrial fibrillation) ([place]) 07/25/2017  . BPH (benign prostatic hyperplasia) 07/25/2017  . Shortness of breath 05/11/2017  . Hypothyroidism 05/11/2017  . Chronic diastolic CHF (congestive heart failure) ([place]) 05/11/2017  . Aortic valve stenosis 06/12/2016  . RBBB 06/12/2016  . PAD (peripheral artery disease) ([place]) 08/04/2012  . Essential hypertension, benign 08/04/2012  . Hyperlipidemia 08/04/2012  . Dizziness 08/04/2012    Past Medical History:  Past Medical History:  Diagnosis Date  . Arthritis   . Borderline diabetic   . Glaucoma   . Hyperlipidemia   .  Hypertension   . PVD (peripheral vascular disease) ([place])   . Sleep apnea    Cpap ordered but doesnt use   Past Surgical History:  Past Surgical History:  Procedure Laterality Date  . ABDOMINAL AORTOGRAM W/LOWER EXTREMITY N/A 07/27/2018   Procedure: ABDOMINAL AORTOGRAM W/LOWER EXTREMITY;  Surgeon: Lorretta Harp, MD;  Location: [facility];  Service: Cardiovascular;  Laterality: N/A;  . AMPUTATION Right 08/28/2018   Procedure: RIGHT BELOW KNEE AMPUTATION;  Surgeon: [name], MD;  Location: [place];  Service: Orthopedics;  Laterality: Right;  . APPENDECTOMY  1943  . CATARACT EXTRACTION  2012   x2  . COLONOSCOPY N/A 11/01/2014   Procedure: COLONOSCOPY;  Surgeon: Aviva Signs, MD;  [facility];  Service: Gastroenterology;  Laterality: N/A;  . ESOPHAGOGASTRODUODENOSCOPY N/A 11/01/2014   Procedure: ESOPHAGOGASTRODUODENOSCOPY (EGD);  Surgeon: Aviva Signs, MD;  [facility];  Service: Gastroenterology;  Laterality: N/A;  . HERNIA REPAIR Left   . INGUINAL HERNIA REPAIR Right 03/01/2013   Procedure: RIGHT INGUINAL HERNIORRHAPHY;  Surgeon: Jamesetta So, MD;  [facility];  Service: General;  Laterality: Right;  . INSERTION OF MESH Right 03/01/2013   Procedure: INSERTION OF MESH;  Surgeon: Jamesetta So, MD;  [facility];  Service: General;  Laterality: Right;  . LOWER EXTREMITY ANGIOGRAPHY Right 08/25/2018   Procedure: LOWER EXTREMITY ANGIOGRAPHY;  Surgeon: Wellington Hampshire, MD;  Location: [facility];  Service: Cardiovascular;  Laterality: Right;  . NM MYOCAR PERF WALL MOTION  06/05/2010  no significant ischemia  . PERIPHERAL VASCULAR ATHERECTOMY Right 08/03/2018   Procedure: PERIPHERAL VASCULAR ATHERECTOMY;  Surgeon: Lorretta Harp, MD;  Location: [facility];  Service: Cardiovascular;  Laterality: Right;  . stent  06/14/2010   left leg  . US ECHOCARDIOGRAPHY  01/21/2006   moderate mitral annular ca+, mild MR, AOV moderately  sclerotic.    Assessment & Plan Clinical Impression: Patient is a 85 y.o. year old right-handed male with history of diabetes mellitus, hypertension, hyperlipidemia, peripheral vascular disease maintained on aspirin and Plavix with recent revascularization procedure right lower extremity. Per chart review patient lives alone completely independent up till 2 weeks ago driving doing his own grocery shopping independent ADLs. He does have family in the area who plan to assist as needed on discharge. Presented 08/24/2018 with gangrenous right foot and toe that has progressed over the past month. Limb was not felt to be salvageable and underwent right transtibial amputation 08/28/2018 per Dr. Sharol Given and application of wound VAC. Splint and shrinker provided by [employer] prosthetics. Hospital course pain management. Subcutaneous Lovenox for DVT prophylaxis. Acute blood loss anemia7-6 8.2and has received 2 units packed red blood cells with mild leukocytosis 12,500.Mild hyponatremia 66moitored.Intermittent bouts of confusion felt to be related to narcotics/multifactorial.Palliative care did follow-up to establish goals of care. Therapy evaluation completed and patient was admitted for a comprehensive rehab program. Patient transferred to CIR on 09/07/2018 .   Patient currently requires mod with mobility secondary to muscle weakness and muscle joint tightness, decreased cardiorespiratoy endurance and decreased sitting balance, decreased standing balance, decreased postural control, decreased balance strategies and difficulty maintaining precautions.  Prior to hospitalization, patient was independent  with mobility and lived with Alone in a House home.  Home access is  Ramped entrance.  Patient will benefit from skilled PT intervention to maximize safe functional mobility, minimize fall risk and decrease caregiver burden for planned discharge home with 24 hour assist.  Anticipate patient will benefit from  [facility].  PT Assessment PT Barriers to Discharge: Lack of/limited family support;Wound Care PT Barriers to Discharge Comments: Currently unsure of d/c destination, patient's home with his son providing 24/7 assist vs daughter's home. Wound vac applied with no drainage in vac  Skilled Therapeutic Intervention In addition to the PT evaluation below, the patient performed the following skilled PT interventions:  Session 1: Patient in bed with RN in room to administer morning medications upon PT arrival. Patient alert and agreeable to PT session.  Therapeutic Activity: Bed Mobility: Patient performed rolling R and L to don paper scrub pants in supine and supine to/from sit with min A in a flat bed without use of bed rails. Provided verbal cues for reaching across with opposite UE and pushing with L LE when rolling R. Required total A LB dressing.  Transfers: Patient performed sit to/from stand x2, stand pivot x1, and a toilet transfer x1 with mod-max A. Provided verbal cues as described below. Improved with sit to/from stand each trial from max A to mod A. Required total a for peri care and LB dressing during toileting. He performed a car transfer with mod A performing lateral scoot transfer with cues for hand placement, head-hips relationship and safety.   Gait Training:  Patient attempted to initiate ambulation, but patient unable to clear L foot from the floor using RW at this time.  Wheelchair Mobility:  Patient propelled wheelchair 50 feet, limited by fatigue and mild SOB, using B UEs with supervision. Provided  verbal cues for equal propulsion with B UEs and turning technique.   Patient in w/c at end of session with breaks locked, seat belt alarm set, and all needs within reach.   Session 2: Patient in w/c with his son in the room upon PT arrival. Patient alert and agreeable to PT session. Patient's son had several questions about patient's progress, the limb guard and  shrinker, and home equipment needs. PT provided an update from the morning evaluation and education for all other questions.   Therapeutic Activity: Bed Mobility: Patient performed sit to supine with min A. Provided verbal cues for holding on to the edge of the bed to assist with lowering trunk and bringing LEs up onto the bed. Transfers: Patient performed a slide board transfer to the bed with min A and total A for set up and board placement. Provided verbal cues for board placement, head-hips relationship, w/c set up, and hand placement.  Therapeutic Exercise: Patient performed the following exercises with verbal and tactile cues for proper technique. -R SLR x10 -R knee flexion in supine x10 Doffed limb guard for exercises, several areas of redness and indentations from limb guard and the cord from the wound vac. Gently re-applied limb guard after exercises with cord and guard re-adjusted to avoid skin breakdown. RN made aware.  Patient in bed with his son at bedside at end of session with breaks locked, bed alarm set, and all needs within reach.   Instructed pt in results of PT evaluation as detailed below, PT POC, rehab potential, rehab goals, and discharge recommendations. Additionally discussed CIR's policies regarding fall safety and use of chair alarm and/or quick release belt. Pt verbalized understanding and in agreement. Will update pt's family members as they become available.    PT Evaluation Precautions/Restrictions Precautions Precaution Comments: pt with R BKA and splint/shrinker from [employer] Other Brace: splint and shrinker from [employer] Restrictions RLE Weight Bearing: Non weight bearing General   Vital SignsTherapy Vitals Temp: 97.6 F (36.4 C) Temp Source: Oral Pulse Rate: 62 Resp: 17 BP: (!) 130/56 Patient Position (if appropriate): Sitting Oxygen Therapy SpO2: 98 % O2 Device: Room Air Pain Pain Assessment Pain Score: 0-No pain Home Living/Prior  Functioning Home Living Available Help at Discharge: Family;Available 24 hours/day Type of Home: House Home Access: Ramped entrance Home Layout: One level Bathroom Shower/Tub: Walk-in shower Additional Comments: His children work, but should be able to workout a rotating schedule for assist at d/c  Lives With: Alone Prior Function Level of Independence: Independent with basic ADLs;Independent with homemaking with ambulation;Independent with transfers;Independent with gait Driving: Yes Vocation: Retired Comments: was driving, grocery shopping and amb without AD up until 6-8 weeks ago Vision/Perception  Perception Perception: Within Functional Limits Praxis Praxis: Intact Praxis Impairment Details: Surveyor, minerals Comments: min imp with learning new movement patterns  Cognition Overall Cognitive Status: Within Functional Limits for tasks assessed Arousal/Alertness: Awake/alert Orientation Level: Oriented X4 Attention: Focused;Sustained Focused Attention: Appears intact Sustained Attention: Appears intact Memory: Appears intact Awareness: Appears intact Safety/Judgment: Appears intact Sensation Sensation Light Touch: Impaired by gross assessment Additional Comments: Deminished light touch in distal hands and feet, reports this is baseline for hime proir to surgery Coordination Gross Motor Movements are Fluid and Coordinated: No Coordination and Movement Description: decreased strength and balance due to R BKA Motor  Motor Motor: Other (comment) Motor - Skilled Clinical Observations: decreased strength, activity tolerance, and balance due to R BKA  Mobility Bed Mobility Bed Mobility: Rolling Right;Rolling Left;Supine to  Sit;Sit to Supine Rolling Right: Minimal Assistance - Patient > 75% Rolling Left: Minimal Assistance - Patient > 75% Supine to Sit: Minimal Assistance - Patient > 75% Sit to Supine: Minimal Assistance - Patient > 75% Transfers Transfers: Sit  to Stand;Stand to Sit;Stand Pivot Transfers;Squat Pivot Transfers Sit to Stand: Maximal Assistance - Patient 25-49% Stand to Sit: Moderate Assistance - Patient 50-74% Stand Pivot Transfers: Moderate Assistance - Patient 50 - 74% Stand Pivot Transfer Details: Verbal cues for safe use of DME/AE;Verbal cues for sequencing;Visual cues for safe use of DME/AE;Verbal cues for technique;Manual facilitation for weight shifting Stand Pivot Transfer Details (indicate cue type and reason): Cues for use of RW, hand placement, leaning forward, pivoting on L LE, and manual facilitation for weight shift forward and over L LE. Squat Pivot Transfers: Moderate Assistance - Patient 50-74% Transfer (Assistive device): Rolling walker Locomotion  Gait Ambulation: No Gait Gait: No Stairs / Additional Locomotion Stairs: No Wheelchair Mobility Wheelchair Mobility: Yes Wheelchair Assistance: Chartered loss adjuster: Both upper extremities Wheelchair Parts Management: Needs assistance Distance: 50'  Trunk/Postural Assessment  Cervical Assessment Cervical Assessment: Exceptions to WFL(foward head) Thoracic Assessment Thoracic Assessment: Exceptions to WFL(kyphosis) Lumbar Assessment Lumbar Assessment: Exceptions to WFL(posterior pelvic tilt) Postural Control Postural Control: Deficits on evaluation(decreased)  Balance Balance Balance Assessed: Yes Static Sitting Balance Static Sitting - Balance Support: Right upper extremity supported;Left upper extremity supported;Feet supported Static Sitting - Level of Assistance: 5: Stand by assistance Dynamic Sitting Balance Dynamic Sitting - Balance Support: Right upper extremity supported;Left upper extremity supported Dynamic Sitting - Level of Assistance: 5: Stand by assistance Dynamic Sitting Balance - Compensations: minimal reach to left due to LOB Dynamic Sitting - Balance Activities: Forward lean/weight shifting;Lateral lean/weight  shifting;Reaching for Consulting civil engineer Standing - Balance Support: Bilateral upper extremity supported Static Standing - Level of Assistance: 4: Min assist Static Standing - Comment/# of Minutes: 2 min Dynamic Standing Balance Dynamic Standing - Balance Support: Bilateral upper extremity supported;During functional activity Dynamic Standing - Level of Assistance: 3: Mod assist Extremity Assessment  RUE Assessment RUE Assessment: Within Functional Limits Active Range of Motion (AROM) Comments: WFL for all functional mobility General Strength Comments: Grossly 4+-5/5 in sitting throughout. LUE Assessment LUE Assessment: Within Functional Limits Active Range of Motion (AROM) Comments: WFL for all functional mobility General Strength Comments: Grossly 4+-5/5 throughout in sitting. RLE Assessment RLE Assessment: Exceptions to [facility] Active Range of Motion (AROM) Comments: lacking ~10 degrees knee extension and knee flexion to ~80 degrees General Strength Comments: Grossly in sitting: 4/5 hip flexion and at least 3/5 knee extension/flexion through available range LLE Assessment LLE Assessment: Exceptions to [facility] Active Range of Motion (AROM) Comments: WFL for all functional mobility General Strength Comments: Grossly in sitting: 4+/5 hip flexion, 5/5 knee flexion/extension, 4/5 DF/PF    Refer to Care Plan for Long Term Goals  Recommendations for other services: Neuropsych and Therapeutic Recreation  Stress management  Discharge Criteria: Patient will be discharged from PT if patient refuses treatment 3 consecutive times without medical reason, if treatment goals not met, if there is a change in medical status, if patient makes no progress towards goals or if patient is discharged from hospital.  The above assessment, treatment plan, treatment alternatives and goals were discussed and mutually agreed upon: by patient  Doreene Burke PT, DPT  09/08/2018, 4:25 PM

## 2018-09-08 NOTE — Progress Notes (Signed)
Social Work Assessment and Plan   Patient Details  Name: [name] MRN: [id number] Date of Birth: 04-07-1927  Today's Date: 09/08/2018  Problem List:  Patient Active Problem List   Diagnosis Date Noted  . Unable to maintain weight-bearing   . Type 2 diabetes mellitus with diabetic peripheral angiopathy with gangrene ([place])   . Anemia due to acute blood loss   . Stage 3 chronic kidney disease ([place])   . Right below-knee amputee ([place]) 09/07/2018  . Gangrene of right foot ([place])   . DNR (do not resuscitate) discussion   . Goals of care, counseling/discussion   . Palliative care by specialist   . Type 2 diabetes mellitus with foot ulcer and gangrene ([place])   . Severe protein-calorie malnutrition ([place])   . Ischemic foot 08/20/2018  . Critical lower limb ischemia 08/03/2018  . Type 2 diabetes mellitus with foot ulcer, without long-term current use of insulin ([place])   . Gastroesophageal reflux disease   . Cellulitis of great toe of right foot 05/29/2018  . Atrial fibrillation, chronic   . Chest pain 07/25/2017  . PAF (paroxysmal atrial fibrillation) ([place]) 07/25/2017  . BPH (benign prostatic hyperplasia) 07/25/2017  . Shortness of breath 05/11/2017  . Hypothyroidism 05/11/2017  . Chronic diastolic CHF (congestive heart failure) ([place]) 05/11/2017  . Aortic valve stenosis 06/12/2016  . RBBB 06/12/2016  . PAD (peripheral artery disease) ([place]) 08/04/2012  . Essential hypertension, benign 08/04/2012  . Hyperlipidemia 08/04/2012  . Dizziness 08/04/2012   Past Medical History:  Past Medical History:  Diagnosis Date  . Arthritis   . Borderline diabetic   . Glaucoma   . Hyperlipidemia   . Hypertension   . PVD (peripheral vascular disease) ([place])   . Sleep apnea    Cpap ordered but doesnt use   Past Surgical History:  Past Surgical History:  Procedure Laterality Date  . ABDOMINAL AORTOGRAM W/LOWER EXTREMITY N/A 07/27/2018   Procedure: ABDOMINAL AORTOGRAM W/LOWER EXTREMITY;   Surgeon: Lorretta Harp, MD;  Location: [facility];  Service: Cardiovascular;  Laterality: N/A;  . AMPUTATION Right 08/28/2018   Procedure: RIGHT BELOW KNEE AMPUTATION;  Surgeon: [name], MD;  Location: [place];  Service: Orthopedics;  Laterality: Right;  . APPENDECTOMY  1943  . CATARACT EXTRACTION  2012   x2  . COLONOSCOPY N/A 11/01/2014   Procedure: COLONOSCOPY;  Surgeon: Aviva Signs, MD;  [facility];  Service: Gastroenterology;  Laterality: N/A;  . ESOPHAGOGASTRODUODENOSCOPY N/A 11/01/2014   Procedure: ESOPHAGOGASTRODUODENOSCOPY (EGD);  Surgeon: Aviva Signs, MD;  [facility];  Service: Gastroenterology;  Laterality: N/A;  . HERNIA REPAIR Left   . INGUINAL HERNIA REPAIR Right 03/01/2013   Procedure: RIGHT INGUINAL HERNIORRHAPHY;  Surgeon: Jamesetta So, MD;  [facility];  Service: General;  Laterality: Right;  . INSERTION OF MESH Right 03/01/2013   Procedure: INSERTION OF MESH;  Surgeon: Jamesetta So, MD;  [facility];  Service: General;  Laterality: Right;  . LOWER EXTREMITY ANGIOGRAPHY Right 08/25/2018   Procedure: LOWER EXTREMITY ANGIOGRAPHY;  Surgeon: Wellington Hampshire, MD;  Location: [place] [facility];  Service: Cardiovascular;  Laterality: Right;  . NM MYOCAR PERF WALL MOTION  06/05/2010   no significant ischemia  . PERIPHERAL VASCULAR ATHERECTOMY Right 08/03/2018   Procedure: PERIPHERAL VASCULAR ATHERECTOMY;  Surgeon: Lorretta Harp, MD;  Location: [place] [facility];  Service: Cardiovascular;  Laterality: Right;  . stent  06/14/2010   left leg  .  US ECHOCARDIOGRAPHY  01/21/2006   moderate mitral annular ca+, mild MR, AOV moderately sclerotic.   Social History:  reports that he quit smoking about 83 years ago. He has quit using smokeless tobacco.  His smokeless tobacco use included chew. He reports that he does not drink alcohol or use drugs.  Family / Support Systems Marital Status: Widow/Widower Patient Roles: Parent, Other  (Comment)(sibling) Children: [name]-son [phone]   [name]-daughter [phone]  [phone] Other Supports: Travis-son [phone] Anticipated Caregiver: Children and his siblings Ability/Limitations of Caregiver: Able to provide physical care if needed. Caregiver Availability: 24/7 Family Dynamics: Close knit family pt has three children who are supportive and willing to assist. Pt has seven brothers and sisters who live on the same land and will assist also. Pt feels he has good supports.  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 Son and daughter plan to be here daily and see pt in therapies   Abuse/Neglect Abuse/Neglect Assessment Can Be Completed: Yes Physical Abuse: Denies Verbal Abuse: Denies Sexual Abuse: Denies Exploitation of patient/patient's resources: Denies Self-Neglect: Denies  Emotional Status Pt's affect, behavior and adjustment status: Pt is motivated to do well and regain his independence. He has always been able to take care of himself and wants to get back to this. He will do his best here and hopes at least to get independent from a wheelchair level. Recent Psychosocial Issues: other health issues-wound on his foot for months went to the wound clinic for it, now is here Psychiatric History: No history deferred depression screen due to coping appropriately and doing well. He is motivated and has a good attitude regarding challenges in life. Substance Abuse History: No issues  Patient / Family Perceptions, Expectations & Goals Pt/Family understanding of illness & functional limitations: Pt and son can explain  his amputation and the process it took to get here. He is glad it is over and now he can begin healing and learning to move and get mobile again. 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, adult and will get there may take time but he will do well."  [employer]: None Premorbid Home Care/DME Agencies: None Transportation available at discharge: Family members Resource referrals recommended: Support group (specify)  Discharge Planning Living Arrangements: Alone Support Systems: Children, Other relatives, Water engineer, Social worker community Type of Residence: Private residence Insurance Resources: Multimedia programmer (specify)(UHC-Medicare) Financial Resources: Radio broadcast assistant Screen Referred: No Living Expenses: Own Money Management: Patient Does the patient have any problems obtaining your medications?: No Home Management: Pt did daughter or sister will assist with Patient/Family Preliminary Plans: Return home with family members assisting with his care. Son-[name] reports he will be there with him when he first goes home. Since he is the only one who doesn't work of pt's children. Will await team evaluations and work on discharge plan. Social Work Anticipated Follow Up Needs: HH/OP, Support Group  Clinical Impression Pleasant gentleman who is motivated to do well on rehab. He  has always been very independent and still drove prior to admission. His children and siblings are involved and will assist at discharge. Will await therapy evaluations and work on his discharge needs.  Elease Hashimoto 09/08/2018, 1:40 PM

## 2018-09-08 NOTE — Care Management Note (Signed)
[facility] Individual Statement of Services  Patient Name:  [name]  Date:  09/08/2018  Welcome to the [facility].  Our goal is to provide you with an individualized program based on your diagnosis and situation, designed to meet your specific needs.  With this comprehensive rehabilitation program, you will be expected to participate in at least 3 hours of rehabilitation therapies Monday-Friday, with modified therapy programming on the weekends.  Your rehabilitation program will include the following services:  Physical Therapy (PT), Occupational Therapy (OT), 24 hour per day rehabilitation nursing, Case Management (Social Worker), Rehabilitation Medicine, Nutrition Services and Pharmacy Services  Weekly team conferences will be held on Wednesday to discuss your progress.  Your Social Worker will talk with you frequently to get your input and to update you on team discussions.  Team conferences with you and your family in attendance may also be held.  Expected length of stay: 10-14 days  Overall anticipated outcome: supervision-CGA level  Depending on your progress and recovery, your program may change. Your Social Worker will coordinate services and will keep you informed of any changes. Your Social Worker's name and contact numbers are listed  below.  The following services may also be recommended but are not provided by the [facility] will be made to provide these services after discharge if needed.  Arrangements include referral to agencies that provide these services.  Your insurance has been verified to be:  [employer]-Medicare Your primary doctor is:  [name]  Pertinent information will be shared with your doctor and your insurance company.  Social Worker:  Ovidio Kin, Orin or ([phone]  Information discussed with and copy given to patient by: Elease Hashimoto, 09/08/2018, 11:51 AM

## 2018-09-08 NOTE — Evaluation (Signed)
Occupational Therapy Assessment and Plan  Patient Details  Name: [name] MRN: [id number] Date of Birth: 11-Dec-1927  OT Diagnosis: muscle weakness (generalized) Rehab Potential: Rehab Potential (ACUTE ONLY): Good ELOS: 12-14 days   Today's Date: 09/08/2018 OT Individual Time: 1030-1130 OT Individual Time Calculation (min): 60 min     Problem List:  Patient Active Problem List   Diagnosis Date Noted  . Unable to maintain weight-bearing   . Type 2 diabetes mellitus with diabetic peripheral angiopathy with gangrene ([place])   . Anemia due to acute blood loss   . Stage 3 chronic kidney disease ([place])   . Right below-knee amputee ([place]) 09/07/2018  . Gangrene of right foot ([place])   . DNR (do not resuscitate) discussion   . Goals of care, counseling/discussion   . Palliative care by specialist   . Type 2 diabetes mellitus with foot ulcer and gangrene ([place])   . Severe protein-calorie malnutrition ([place])   . Ischemic foot 08/20/2018  . Critical lower limb ischemia 08/03/2018  . Type 2 diabetes mellitus with foot ulcer, without long-term current use of insulin ([place])   . Gastroesophageal reflux disease   . Cellulitis of great toe of right foot 05/29/2018  . Atrial fibrillation, chronic   . Chest pain 07/25/2017  . PAF (paroxysmal atrial fibrillation) ([place]) 07/25/2017  . BPH (benign prostatic hyperplasia) 07/25/2017  . Shortness of breath 05/11/2017  . Hypothyroidism 05/11/2017  . Chronic diastolic CHF (congestive heart failure) ([place]) 05/11/2017  . Aortic valve stenosis 06/12/2016  . RBBB 06/12/2016  . PAD (peripheral artery disease) ([place]) 08/04/2012  . Essential hypertension, benign 08/04/2012  . Hyperlipidemia 08/04/2012  . Dizziness 08/04/2012    Past Medical History:  Past Medical History:  Diagnosis Date  . Arthritis   . Borderline diabetic   . Glaucoma   . Hyperlipidemia   . Hypertension   . PVD (peripheral vascular disease) ([place])   . Sleep apnea    Cpap ordered but  doesnt use   Past Surgical History:  Past Surgical History:  Procedure Laterality Date  . ABDOMINAL AORTOGRAM W/LOWER EXTREMITY N/A 07/27/2018   Procedure: ABDOMINAL AORTOGRAM W/LOWER EXTREMITY;  Surgeon: Lorretta Harp, MD;  Location: [facility];  Service: Cardiovascular;  Laterality: N/A;  . AMPUTATION Right 08/28/2018   Procedure: RIGHT BELOW KNEE AMPUTATION;  Surgeon: [name], MD;  Location: [facility];  Service: Orthopedics;  Laterality: Right;  . APPENDECTOMY  1943  . CATARACT EXTRACTION  2012   x2  . COLONOSCOPY N/A 11/01/2014   Procedure: COLONOSCOPY;  Surgeon: Aviva Signs, MD;  [facility];  Service: Gastroenterology;  Laterality: N/A;  . ESOPHAGOGASTRODUODENOSCOPY N/A 11/01/2014   Procedure: ESOPHAGOGASTRODUODENOSCOPY (EGD);  Surgeon: Aviva Signs, MD;  [facility];  Service: Gastroenterology;  Laterality: N/A;  . HERNIA REPAIR Left   . INGUINAL HERNIA REPAIR Right 03/01/2013   Procedure: RIGHT INGUINAL HERNIORRHAPHY;  Surgeon: Jamesetta So, MD;  Location: [facility];  Service: General;  Laterality: Right;  . INSERTION OF MESH Right 03/01/2013   Procedure: INSERTION OF MESH;  Surgeon: Jamesetta So, MD;  Location: [facility];  Service: General;  Laterality: Right;  . LOWER EXTREMITY ANGIOGRAPHY Right 08/25/2018   Procedure: LOWER EXTREMITY ANGIOGRAPHY;  Surgeon: Wellington Hampshire, MD;  Location: [facility];  Service: Cardiovascular;  Laterality: Right;  . NM MYOCAR PERF WALL MOTION  06/05/2010   no significant ischemia  . PERIPHERAL VASCULAR ATHERECTOMY Right 08/03/2018   Procedure: PERIPHERAL  VASCULAR ATHERECTOMY;  Surgeon: Lorretta Harp, MD;  Location: [facility];  Service: Cardiovascular;  Laterality: Right;  . stent  06/14/2010   left leg  . US ECHOCARDIOGRAPHY  01/21/2006   moderate mitral annular ca+, mild MR, AOV moderately sclerotic.    Assessment & Plan Clinical Impression:  [name] is an 85 year old right-handed  male with history of diabetes mellitus, hypertension, hyperlipidemia, peripheral vascular disease maintained on aspirin and Plavix with recent revascularization procedure right lower extremity.  Per chart review patient lives alone completely independent up till 2 weeks ago driving doing his own grocery shopping independent ADLs.  He does have family in the area who plan to assist as needed on discharge.  Presented 08/24/2018 with gangrenous right foot and toe that has progressed over the past month.  Limb was not felt to be salvageable and underwent right transtibial amputation 08/28/2018 per Dr. Sharol Johnston and application of wound VAC.  Splint and shrinker provided by [name] prosthetics.  Hospital course pain management.  Subcutaneous Lovenox for DVT prophylaxis.  Acute blood loss anemia7-6 8.2 and has received 2 units packed red blood cells with mild leukocytosis 12,500.  Mild hyponatremia 128 monitored.  Intermittent bouts of confusion felt to be related to narcotics/multifactorial.  Palliative care did follow-up to establish goals of care.  Therapy evaluation completed and patient was admitted for a comprehensive rehab program   Patient transferred to CIR on 09/07/2018 .    Patient currently requires max with basic self-care skills secondary to muscle weakness, decreased cardiorespiratoy endurance and decreased sitting balance, decreased standing balance, decreased postural control and decreased balance strategies.  Prior to hospitalization, patient was independent and driving up until 2 months prior to hospitalization.   Patient will benefit from skilled intervention to increase independence with basic self-care skills prior to discharge home with care partner.  Anticipate patient will require 24 hour supervision and minimal physical assistance and follow up home health.  OT - End of Session Activity Tolerance: Tolerates 10 - 20 min activity with multiple rests Endurance Deficit: Yes Endurance Deficit  Description: requires frequent rest breaks between activities OT Assessment Rehab Potential (ACUTE ONLY): Good OT Patient demonstrates impairments in the following area(s): Balance;Endurance;Motor;Sensory;Other (Comment)(motor planning with new motor learning) OT Basic ADL's Functional Problem(s): Toileting;Dressing;Bathing OT Transfers Functional Problem(s): Toilet;Tub/Shower OT Additional Impairment(s): None OT Plan OT Intensity: Minimum of 1-2 x/day, 45 to 90 minutes OT Frequency: 5 out of 7 days OT Duration/Estimated Length of Stay: 12-14 days OT Treatment/Interventions: Balance/vestibular training;Discharge planning;DME/adaptive equipment instruction;Functional mobility training;Pain management;Psychosocial support;Patient/family education;Self Care/advanced ADL retraining;Therapeutic Activities;Therapeutic Exercise;UE/LE Strength taining/ROM OT Self Feeding Anticipated Outcome(s): no goal, pt is I OT Basic Self-Care Anticipated Outcome(s): CGA OT Toileting Anticipated Outcome(s): CGA OT Bathroom Transfers Anticipated Outcome(s): CGA OT Recommendation Patient destination: Home Follow Up Recommendations: Home health OT Equipment Recommended: 3 in 1 bedside comode;Tub/shower seat;To be determined   Skilled Therapeutic Intervention Pt seen for initial evaluation and ADL training with a focus on transfers.  Discussed role of OT, POC and pt's goals.  Pt had already had LB clothing on for PT session and declined bathing today.  He donned new shirt and agreeable to trying sliding board transfers to make toilet transfers easier.   Pt initially practiced slide board w/c>< bed with mod A needing cues to maintain a forward lean as he has a tendency to lean back as he tries to scoot along. He did well using L leg to push up hips for a scoot versus  a slide.  To practice to a wide drop arm commode, his NT provided a +2 S as there was a large gap between w/c and BSC.  Pt was also able to do this  transfer with mod A.  On the BSC, simulated pt self cleansing but he is not able to fully reach due to limited lateral lean and wt shift. Pt transferred back to w.c and practiced leaning side to side and pushing up with B hands.  He continues to try to press up and push his upper back backwards, so will need to work on this to make his movement, transfers safer.   Pt resting in w.c with all needs met. Belt alarm on.   OT Evaluation Precautions/Restrictions  Precautions Precaution Comments: pt with R BKA and splint/shrinker from [employer] Other Brace: splint and shrinker from [employer] Restrictions RLE Weight Bearing: Non weight bearing  Pain Pain Assessment Pain Score: 0-No pain Home Living/Prior Functioning Home Living Available Help at Discharge: Family, Available 24 hours/day Type of Home: House Home Access: Ramped entrance Home Layout: One level Bathroom Shower/Tub: Walk-in shower Additional Comments: His children work, but should be able to workout a rotating schedule for assist at d/c  Lives With: Alone Prior Function Level of Independence: Independent with basic ADLs, Independent with homemaking with ambulation, Independent with transfers, Independent with gait Driving: Yes Vocation: Retired Comments: was driving, grocery shopping and amb without AD up until 6-8 weeks ago ADL  total A LB self care, set up UB self care Vision Baseline Vision/History: Wears glasses Wears Glasses: At all times Patient Visual Report: No change from baseline Vision Assessment?: No apparent visual deficits Perception  Perception: Within Functional Limits Praxis Praxis: Intact Praxis Impairment Details: Motor planning Praxis-Other Comments: min imp with learning new movement patterns Cognition Overall Cognitive Status: Within Functional Limits for tasks assessed Arousal/Alertness: Awake/alert Orientation Level: Person;Place;Situation Person: Oriented Place: Oriented Situation:  Oriented Year: Other (Comment)("2000"  oh yes, that is right it is 2020) Month: August Day of Week: Incorrect(Monday) Memory: Appears intact Immediate Memory Recall: Sock;Blue;Bed Memory Recall Sock: Without Cue Memory Recall Blue: Without Cue Memory Recall Bed: Without Cue Attention: Focused;Sustained Focused Attention: Appears intact Sustained Attention: Appears intact Awareness: Appears intact Safety/Judgment: Appears intact Sensation Sensation Light Touch: Impaired by gross assessment Hot/Cold: Appears Intact Proprioception: Appears Intact Stereognosis: Appears Intact Additional Comments: Deminished light touch in distal hands and feet, reports this is baseline for hime proir to surgery Coordination Gross Motor Movements are Fluid and Coordinated: No Fine Motor Movements are Fluid and Coordinated: Yes Coordination and Movement Description: decreased strength and balance due to R BKA Motor  Motor Motor: Other (comment) Motor - Skilled Clinical Observations: decreased strength, activity tolerance, and balance due to R BKA Mobility  Bed Mobility Bed Mobility: Rolling Right;Rolling Left;Supine to Sit;Sit to Supine Rolling Right: Minimal Assistance - Patient > 75% Rolling Left: Minimal Assistance - Patient > 75% Supine to Sit: Minimal Assistance - Patient > 75% Sit to Supine: Minimal Assistance - Patient > 75% Transfers Sit to Stand: Maximal Assistance - Patient 25-49% Stand to Sit: Moderate Assistance - Patient 50-74%  Trunk/Postural Assessment  Cervical Assessment Cervical Assessment: Exceptions to WFL(foward head) Thoracic Assessment Thoracic Assessment: Exceptions to WFL(kyphosis) Lumbar Assessment Lumbar Assessment: Exceptions to WFL(posterior pelvic tilt) Postural Control Postural Control: Deficits on evaluation(decreased)  Balance Balance Balance Assessed: Yes Static Sitting Balance Static Sitting - Balance Support: Right upper extremity supported;Left upper  extremity supported;Feet supported Static Sitting - Level of Assistance: 5:  Stand by assistance Dynamic Sitting Balance Dynamic Sitting - Balance Support: Right upper extremity supported;Left upper extremity supported Dynamic Sitting - Level of Assistance: 5: Stand by assistance Dynamic Sitting Balance - Compensations: minimal reach to left due to LOB Dynamic Sitting - Balance Activities: Forward lean/weight shifting;Lateral lean/weight shifting;Reaching for Consulting civil engineer Standing - Balance Support: Bilateral upper extremity supported Static Standing - Level of Assistance: 4: Min assist Static Standing - Comment/# of Minutes: 2 min Dynamic Standing Balance Dynamic Standing - Balance Support: Bilateral upper extremity supported;During functional activity Dynamic Standing - Level of Assistance: 3: Mod assist Extremity/Trunk Assessment RUE Assessment RUE Assessment: Within Functional Limits Active Range of Motion (AROM) Comments: WFL for all functional mobility General Strength Comments: Grossly 4+-5/5 in sitting throughout. LUE Assessment LUE Assessment: Within Functional Limits Active Range of Motion (AROM) Comments: WFL for all functional mobility General Strength Comments: Grossly 4+-5/5 throughout in sitting.     Refer to Care Plan for Long Term Goals  Recommendations for other services: None    Discharge Criteria: Patient will be discharged from OT if patient refuses treatment 3 consecutive times without medical reason, if treatment goals not met, if there is a change in medical status, if patient makes no progress towards goals or if patient is discharged from hospital.  The above assessment, treatment plan, treatment alternatives and goals were discussed and mutually agreed upon: by patient  [facility] 09/08/2018, 12:33 PM

## 2018-09-09 ENCOUNTER — Inpatient Hospital Stay (HOSPITAL_COMMUNITY): Payer: Medicare Other

## 2018-09-09 LAB — GLUCOSE, CAPILLARY
Glucose-Capillary: 111 mg/dL — ABNORMAL HIGH (ref 70–99)
Glucose-Capillary: 153 mg/dL — ABNORMAL HIGH (ref 70–99)
Glucose-Capillary: 139 mg/dL — ABNORMAL HIGH (ref 70–99)
Glucose-Capillary: 129 mg/dL — ABNORMAL HIGH (ref 70–99)

## 2018-09-09 NOTE — Progress Notes (Signed)
Social Work Patient ID: [name], male   DOB: 1927-09-13, 85 y.o.   MRN: [id number]  Met with pt and son who is here to discuss team conference goals CGA-min assist level and target discharge date 9/8. Discussed having son come in prior to discharge and do hands on training since he will be the one to be there with Dad first. He is agreeable to do this. Will work on discharge needs.

## 2018-09-09 NOTE — Patient Care Conference (Signed)
Inpatient RehabilitationTeam Conference and Plan of Care Update Date: 09/09/2018   Time: 10:10 AM    Patient Name: [name]      Medical Record Number: [id number]  Date of Birth: 12-27-1927 Sex: Male         Room/Bed: 4M05C/4M05C-01 Payor Info: Payor: Marine scientist / Plan: [employer] MEDICARE / Product Type: *No Product type* /    Admitting Diagnosis: 4. Gen Team RT. BKA  Admit Date/Time:  09/07/2018  3:24 PM Admission Comments: No comment available   Primary Diagnosis:  <principal problem not specified> Principal Problem: <principal problem not specified>  Patient Active Problem List   Diagnosis Date Noted  . Unable to maintain weight-bearing   . Type 2 diabetes mellitus with diabetic peripheral angiopathy with gangrene ([place])   . Anemia due to acute blood loss   . Stage 3 chronic kidney disease ([place])   . Right below-knee amputee ([place]) 09/07/2018  . Gangrene of right foot ([place])   . DNR (do not resuscitate) discussion   . Goals of care, counseling/discussion   . Palliative care by specialist   . Type 2 diabetes mellitus with foot ulcer and gangrene ([place])   . Severe protein-calorie malnutrition ([place])   . Ischemic foot 08/20/2018  . Critical lower limb ischemia 08/03/2018  . Type 2 diabetes mellitus with foot ulcer, without long-term current use of insulin ([place])   . Gastroesophageal reflux disease   . Cellulitis of great toe of right foot 05/29/2018  . Atrial fibrillation, chronic   . Chest pain 07/25/2017  . PAF (paroxysmal atrial fibrillation) ([place]) 07/25/2017  . BPH (benign prostatic hyperplasia) 07/25/2017  . Shortness of breath 05/11/2017  . Hypothyroidism 05/11/2017  . Chronic diastolic CHF (congestive heart failure) ([place]) 05/11/2017  . Aortic valve stenosis 06/12/2016  . RBBB 06/12/2016  . PAD (peripheral artery disease) ([place]) 08/04/2012  . Essential hypertension, benign 08/04/2012  . Hyperlipidemia 08/04/2012  . Dizziness 08/04/2012    Expected  Discharge Date: Expected Discharge Date: 09/22/18  Team Members Present: Physician leading conference: Dr. Courtney Heys Social Worker Present: Ovidio Kin, LCSW Nurse Present: Rayetta Humphrey, RN PT Present: Apolinar Junes, PT OT Present: Roanna Epley, [place], OT SLP Present: Weston Anna, SLP PPS Coordinator present : Gunnar Fusi, SLP     Current Status/Progress Goal Weekly Team Focus  Medical   R BKA, need to remove wound VAC on leg, CKD Stage III  no pain- LBM yesterday- wound VAC  wound VAC off   Bowel/Bladder   pt continent of both, LBM 8/25, does have urinary urgency  address toielting needs  assess toileting q shift and prn   Swallow/Nutrition/ Hydration             ADL's   set up UB self care, total A LB self care, mod A slide board, max A squat pivot transfer  CGA with transfers and LB self care tasks  ADL training, functional mobility, postural control, strengthening, pt/ family education   Mobility   Min A bed mobility and slide board transfers, mod-max A sit<>stand, stand pivot, and squat pivot transfers with RW, S w/c 50'  Supervision w/c mobility, CGA transfers, min A gait 10'  Strengthening, ROM, limb loss education, edema control, skin integrity, balance, transfer taining, initiating gait training, wheelchair mobility, patient/family education   Communication             Safety/Cognition/ Behavioral Observations            Pain   pt  has had no c/o pain during shift, does have PRNs  maintain pain < 4  assess pain q shift and prn   Skin   wound vac on right BKA, sore on lip that's healing, skin tear on penis, MASD on perinium/groin   address current skin status and pervent further/new breakdown  assess skin q shift and prn      *See Care Plan and progress notes for long and short-term goals.     Barriers to Discharge  Current Status/Progress Possible Resolutions Date Resolved   Physician    Decreased caregiver support;Medical stability;Wound  Care;Lack of/limited family support;Medication compliance;Weight bearing restrictions;Nutrition means  n/a  supervison to max assist for ADLs and min-max assist for PT  get wound VAC off      Nursing                  PT  Lack of/limited family support;Wound Care  Currently unsure of d/c destination, patient's home with his son providing 24/7 assist vs daughter's home. Wound vac applied with no drainage in vac              OT                  SLP                SW                Discharge Planning/Teaching Needs:  Home with family members taking turns to provide assist. Aware when discharged will need 24 hr care at home-son to begin as caregiver.      Team Discussion:  Goals CGA-min with gait. Currently min-mod level of assist. Wound vac cord pressuring amputation. Adjusting this for less pressure. Pain is managed and fatigues quickly-needs rest breaks. Son here daily and provides support.  Revisions to Treatment Plan:  DC 9/8    Continued Need for Acute Rehabilitation Level of Care: The patient requires daily medical management by a physician with specialized training in physical medicine and rehabilitation for the following conditions: Daily direction of a multidisciplinary physical rehabilitation program to ensure safe treatment while eliciting the highest outcome that is of practical value to the patient.: Yes Daily medical management of patient stability for increased activity during participation in an intensive rehabilitation regime.: Yes Daily analysis of laboratory values and/or radiology reports with any subsequent need for medication adjustment of medical intervention for : Neurological problems;Renal problems;Blood pressure problems;Nutritional problems;Mood/behavior problems;Other   I attest that I was present, lead the team conference, and concur with the assessment and plan of the team. Teleconference held due to COVID 19   Nasha Diss, Gardiner Rhyme 09/09/2018, 3:25 PM

## 2018-09-09 NOTE — Progress Notes (Signed)
Physical Therapy Session Note  Patient Details  Name: [name] MRN: [id number] Date of Birth: 01-Oct-1927  Today's Date: 09/09/2018 PT Individual Time: 1135-1215 and 1310-1415 PT Individual Time Calculation (min): 40 and 65 min   Short Term Goals: Week 1:  PT Short Term Goal 1 (Week 1): Patient will perform bed mobility with supervision. PT Short Term Goal 2 (Week 1): Patient will perform basic transfers with min A. PT Short Term Goal 3 (Week 1): Patient will perform dynamic standing balance activities with min A. PT Short Term Goal 4 (Week 1): Patient will initiate ambulation. PT Short Term Goal 5 (Week 1): Patient will propel w/c 75 feet without a rest break.  Skilled Therapeutic Interventions/Progress Updates:     Session 1: Patient in w/c with his son in the room upon PT arrival. Patient alert and agreeable to PT session. Patient denied pain during session.   Therapeutic Activity: Transfers: Patient performed sit to/from stand x3 and using a RW with mod A and a lateral scoot transfer from the w/c to the recliner, dropping both arm rests down, with min A. Provided verbal cues for hand placement on RW, scooting forward and leaning forward to stand, and to reach back to sit. Dressing: Patient doffed his shirt and donned a t-shirt with supervision and significant time to complete UB dressing. He then doffed paper scrub pants and donned shorts with max-total A to thread pants on/off LEs in sitting and pull pants down/up in standing.  Patient in recliner in room with his son at end of session with breaks locked, seat belt alarm set, and all needs within reach.   Session 2: Patient in recliner finishing lunch with his son in the room upon PT arrival. Patient alert and agreeable to PT session. Patient denied pain during session.   Therapeutic Activity: Bed Mobility: Patient performed sit to supine with min A for LE managment. Provided verbal cues for bringing LEs up as he lowered his  trunk down for increased momentum with bring LEs up to the bed. Transfers: Patient performed a lteral scoot transfer to the L as above. He performed sit to/from stand x3 in the // bars with min A. Provided verbal cues for leaning forward to stand and bring hips forward in standing. He performed a slide board transfer from the w/c to the bed with min A and total A for board placement and set up. Provided cues for w/c positioning/set-up, board placement, hand placement, head-hips relationship, and lifting his bottom to scoot.   Gait Training:  Patient performed pre-gait in // bars pushing through B UEs to lift R foot up on his toes 3x5 in preparation for ambulation.  Wheelchair Mobility:  Patient propelled wheelchair 150 feet x2 with supervision with B UEs. Provided verbal cues for equal propulsion with B UEs, turning technique, and use of breaks throughout session.   Patient in bed at end of session with his son and NT in the room with breaks locked, bed alarm set, and all needs within reach. Educated on energy conservation techniques, deconditioning after surgery, limb shaping, and pressure relief throughout session.    Therapy Documentation Precautions:  Precautions Precaution Comments: pt with R BKA and splint/shrinker from [employer] Other Brace: splint and shrinker from [employer] Restrictions Weight Bearing Restrictions: Yes RLE Weight Bearing: Non weight bearing Vital Signs: Therapy Vitals Temp: 97.7 F (36.5 C) Temp Source: Oral Pulse Rate: (!) 58 Resp: 20 BP: (!) 141/53 Patient Position (if appropriate): Lying Oxygen Therapy SpO2: 100 %  O2 Device: Room Air   Therapy/Group: Individual Therapy  Mehr Depaoli L Hoorain Kozakiewicz PT, DPT  09/09/2018, 4:34 PM

## 2018-09-09 NOTE — Progress Notes (Signed)
Wound Vac was d/c as order by Helyn Numbers, PAC. Pt. Tolerated fine.Dry dressing was applied as ordered by PA.

## 2018-09-09 NOTE — Progress Notes (Signed)
[facility] PHYSICAL MEDICINE & REHABILITATION PROGRESS NOTE   Subjective/Complaints:  Pt denies any pain- is not asking for any pain meds.  Per staff, wound VAC has been on 10 days- is also leaving a mark on BKA from wound VAC tubing and would like it moved- will see- found out from PA can remove wound VAC.  Is doing "fairly well".  Denies constipation- LBM yesterday.    Objective:   No results found. Recent Labs    09/07/18 1604 09/08/18 0616  WBC 9.4 7.1  HGB 9.4* 8.9*  HCT 29.5* 26.8*  PLT 268 276   Recent Labs    09/07/18 0344 09/07/18 1604 09/08/18 0616  NA 130*  --  132*  K 4.3  --  3.8  CL 100  --  101  CO2 19*  --  24  GLUCOSE 132*  --  118*  BUN 19  --  16  CREATININE 1.12 1.13 0.98  CALCIUM 8.3*  --  8.4*    Intake/Output Summary (Last 24 hours) at 09/09/2018 1509 Last data filed at 09/09/2018 0840 Gross per 24 hour  Intake 478 ml  Output 1125 ml  Net -647 ml     Physical Exam: Vital Signs Blood pressure (!) 141/53, pulse (!) 58, temperature 97.7 F (36.5 C), temperature source Oral, resp. rate 20, height 5\' 11"  (1.803 m), weight 106 kg, SpO2 100 %.   Physical Exam Vitals and labs reviewed Constitutional: awake, alert, appropriate, sitting up in bed, appears comfortable, wearing eyeglasses HENT:  Head:Normocephalicand atraumatic. Healing sore on lip-looks slightly better Eyes:Pupils are equal, round, and reactive to light.EOMare normal.  Neck:Normal range of motion.No tracheal deviationpresent. No thyromegalypresent.  Cardiovascular:Normal rate. Murmurheard. IRR IRR still Respiratory:CTA B/L - very clear today- good air movement B/L CM:KLKJ. NT, ND, (+)BS Musculoskeletal:Normal range of motion.  Comments: RLE in vac, some associated swelling; mild generalized muscle atrophy noted Neurological: He is alertand oriented to person, place, and time. Patient is alert. He is a bit hard of hearing. Makes good eye  contact with examiner.   UE 5/5. RLE: can lift against gravity. LLE: 3/5 HF, 4/5 KE, ADF/PF. No focal sensory findings.  Skin: He isnot diaphoretic. Mild-mod edema of RLE- low BKA -not mid-upper BKA; trace edema of LLE as well BKA site is dressed with limb guard in place as well as wound VAC- small amount of drainage in VAC. Psychiatric: He has a normal mood and affect. Bright affect      Assessment/Plan: 1. 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  Physiatrist is providing close team supervision and 24 hour management of active medical problems listed below.  Physiatrist and rehab team continue to assess barriers to discharge/monitor patient progress toward functional and medical goals  Care Tool:  Bathing    Body parts bathed by patient: Right arm, Left arm, Chest, Abdomen, Front perineal area, Right upper leg, Left upper leg, Face   Body parts bathed by helper: Left lower leg, Buttocks Body parts n/a: Right lower leg   Bathing assist Assist Level: Moderate Assistance - Patient 50 - 74%     Upper Body Dressing/Undressing Upper body dressing   What is the patient wearing?: Pull over shirt    Upper body assist Assist Level: Contact Guard/Touching assist    Lower Body Dressing/Undressing Lower body dressing  What is the patient wearing?: Incontinence brief, Pants     Lower body assist Assist for lower body dressing: Total Assistance - Patient < 25%     Toileting Toileting    Toileting assist Assist for toileting: Maximal Assistance - Patient 25 - 49%     Transfers Chair/bed transfer  Transfers assist     Chair/bed transfer assist level: Moderate Assistance - Patient 50 - 74%     Locomotion Ambulation   Ambulation assist   Ambulation activity did not occur:  Safety/medical concerns(decreased strength/activity tolerance)          Walk 10 feet activity   Assist  Walk 10 feet activity did not occur: Safety/medical concerns(decreased strength/activity tolerance)        Walk 50 feet activity   Assist Walk 50 feet with 2 turns activity did not occur: Safety/medical concerns(decreased strength/activity tolerance)         Walk 150 feet activity   Assist Walk 150 feet activity did not occur: Safety/medical concerns(decreased strength/activity tolerance)         Walk 10 feet on uneven surface  activity   Assist Walk 10 feet on uneven surfaces activity did not occur: Safety/medical concerns(decreased strength/activity tolerance)         Wheelchair     Assist Will patient use wheelchair at discharge?: Yes Type of Wheelchair: Manual    Wheelchair assist level: Supervision/Verbal cueing, Set up assist Max wheelchair distance: 11'    Wheelchair 50 feet with 2 turns activity    Assist        Assist Level: Set up assist, Supervision/Verbal cueing   Wheelchair 150 feet activity     Assist  CBG (last 3)  Recent Labs    09/08/18 2036 09/09/18 0613 09/09/18 1137  GLUCAP 196* 111* 153*   Wheelchair 150 feet activity did not occur: Safety/medical concerns(decreased strength/activity tolerance)       Blood pressure (!) 141/53, pulse (!) 58, temperature 97.7 F (36.5 C), temperature source Oral, resp. rate 20, height 5\' 11"  (1.803 m), weight 106 kg, SpO2 100 %.  Medical Problem List and Plan: 1.Decreased functional mobilitysecondary to right transtibial amputation 08/28/2018 as well as recent right lower extremity revascularization procedure. -admit to inpatient rehab -continue Wound VAC as directed(he is 10 days  post-op)--dc soon?--check with ortho  8/25- will call Ortho tomorrow- give them a chance to come  by.  8/26- wound VAC removed today- OK'd by surgery 2.  Antithrombotics: -DVT/anticoagulation:Lovenox -antiplatelet therapy: Aspirin 81 mg daily, Plavix 75 mg daily 3. Pain Management:Robaxin andoxycodoneas needed.  8/25- pt denies pain, but didn't say if b/c took meds.  8/26- refusing pain meds 4. Mood:Provide emotional support -antipsychotic agents: N/A 5. Neuropsych: This patientiscapable of making decisions on hisown behalf. 6. Skin/Wound Care:continue prevena vac per ortho. He is POD #10---dc soon?  8/26- removed today 7. Fluids/Electrolytes/Nutrition:Routine in and outs with follow-up chemistrieson admit.  8/25 Na slightly better from 130 to 132. 8.ABLA.Follow-up CBC  8/25- Drop from 9.4 to 8.9- will recheck Thursday. 9. Hypertension. Cardura 2 mg nightly. Monitor with increased mobility  8/25- BP spike this AM to 160s/66- will monitor 10. Diabetes mellitus, hemoglobin A1c 7.0. SSI. Check blood sugars before meals and at bedtime. Lantus insulin 10 units daily. Patient on Glucotrol 5 mg twice daily prior to admission.  -Resume glucotrol as needed -fair control at present.  8/25- [id number]- fairly well controlled 11. BPH. Proscar 5 mg daily,Flomax 0.4 mg twice daily. Check PVR 12. Hypothyroidism. Synthroid 13.  Constipation. Laxative assistance  8/25- LBM yesterday  8/26- LBM yesterday afternoon 14.Hyperlipidemia. Lipitor  15. Dispo-   8/26- team conference today- therapy wants limb guard off prn- since wound VAC is off, no need.  -set d/c date as 9/8 based on min-max assist with therapies  LOS: 2 days A FACE TO FACE EVALUATION WAS PERFORMED  Sabino Denning 09/09/2018, 3:09 PM

## 2018-09-09 NOTE — Progress Notes (Signed)
Occupational Therapy Session Note  Patient Details  Name: [name] MRN: [id number] Date of Birth: 1927/02/22  Today's Date: 09/09/2018 OT Individual Time: 0815-0930 OT Individual Time Calculation (min): 75 min    Short Term Goals: Week 1:  OT Short Term Goal 1 (Week 1): Pt will be able to complete a squat pivot to and from [facility] or toilet with mod A of 1. OT Short Term Goal 2 (Week 1): Pt will be able to pull pants down to prepare for toileting with min A. OT Short Term Goal 3 (Week 1): Pt will be able to pull pants up after toileting with mod A. OT Short Term Goal 4 (Week 1): Pt will be able to don pants over legs with min A. OT Short Term Goal 5 (Week 1): Pt will be able to sit to stand with min A.  Skilled Therapeutic Interventions/Progress Updates:    OT intervention with focus on bed mobility, functional transfers, sit<>stand, standing balance, BADL retraiing, activity tolerance and safety awareness to increase independence with BADLs.  Pt resting in bed upon arrival and agreeable to getting OOB for therapy.  Pt required mod A for supine>sit EOB with max verbal cues for sequencing.  Pt performed squat pivot transfer to L with mod A.  Pt performed sit<>stand X 4 at sink beginning with mod A and progressing to light min A.  Pt able to stand at sink with min A and BUE support.  Pt did removed LUE off RW to assist with clothing management with no LOB and min A for balance.  Pt propelled w/c to gym for BUE therx on [employer] (load 1 for 3 mins).  Pt fatigued quickly and required rest breaks X 3 during therex.  Pt propelled w/c back to room and remained seated in w/c with all needs within reach and belt alarm activated.   Therapy Documentation Precautions:  Precautions Precaution Comments: pt with R BKA and splint/shrinker from [employer] Other Brace: splint and shrinker from [employer] Restrictions Weight Bearing Restrictions: Yes RLE Weight Bearing: Non weight bearing General:   Vital  Signs:  Pain:  Pt denies pain this morning    Therapy/Group: Individual Therapy  [name] 09/09/2018, 9:41 AM

## 2018-09-10 ENCOUNTER — Inpatient Hospital Stay (HOSPITAL_COMMUNITY): Payer: Medicare Other

## 2018-09-10 ENCOUNTER — Inpatient Hospital Stay (HOSPITAL_COMMUNITY): Payer: Medicare Other | Admitting: Occupational Therapy

## 2018-09-10 ENCOUNTER — Inpatient Hospital Stay (HOSPITAL_COMMUNITY): Payer: Medicare Other | Admitting: *Deleted

## 2018-09-10 ENCOUNTER — Inpatient Hospital Stay (HOSPITAL_COMMUNITY): Payer: Medicare Other | Admitting: Physical Therapy

## 2018-09-10 LAB — BASIC METABOLIC PANEL
Anion gap: 8 (ref 5–15)
BUN: 14 mg/dL (ref 8–23)
CO2: 22 mmol/L (ref 22–32)
Calcium: 8.6 mg/dL — ABNORMAL LOW (ref 8.9–10.3)
Chloride: 101 mmol/L (ref 98–111)
Creatinine, Ser: 1.1 mg/dL (ref 0.61–1.24)
GFR calc Af Amer: >60 mL/min (ref >60)
GFR calc non Af Amer: 59 mL/min — ABNORMAL LOW (ref >60)
Glucose, Bld: 109 mg/dL — ABNORMAL HIGH (ref 70–99)
Potassium: 3.9 mmol/L (ref 3.5–5.1)
Sodium: 131 mmol/L — ABNORMAL LOW (ref 135–145)

## 2018-09-10 LAB — GLUCOSE, CAPILLARY
Glucose-Capillary: 104 mg/dL — ABNORMAL HIGH (ref 70–99)
Glucose-Capillary: 136 mg/dL — ABNORMAL HIGH (ref 70–99)
Glucose-Capillary: 136 mg/dL — ABNORMAL HIGH (ref 70–99)
Glucose-Capillary: 171 mg/dL — ABNORMAL HIGH (ref 70–99)

## 2018-09-10 LAB — CBC WITH DIFFERENTIAL/PLATELET
Abs Immature Granulocytes: 0.02 10*3/uL (ref 0.00–0.07)
Basophils Absolute: 0.1 10*3/uL (ref 0.0–0.1)
Basophils Relative: 1 %
Eosinophils Absolute: 0.4 10*3/uL (ref 0.0–0.5)
Eosinophils Relative: 6 %
HCT: 27.5 % — ABNORMAL LOW (ref 39.0–52.0)
Hemoglobin: 9 g/dL — ABNORMAL LOW (ref 13.0–17.0)
Immature Granulocytes: 0 %
Lymphocytes Relative: 41 %
Lymphs Abs: 3 10*3/uL (ref 0.7–4.0)
MCH: 31.4 pg (ref 26.0–34.0)
MCHC: 32.7 g/dL (ref 30.0–36.0)
MCV: 95.8 fL (ref 80.0–100.0)
Monocytes Absolute: 0.7 10*3/uL (ref 0.1–1.0)
Monocytes Relative: 9 %
Neutro Abs: 3.1 10*3/uL (ref 1.7–7.7)
Neutrophils Relative %: 43 %
Platelets: 274 10*3/uL (ref 150–400)
RBC: 2.87 MIL/uL — ABNORMAL LOW (ref 4.22–5.81)
RDW: 18 % — ABNORMAL HIGH (ref 11.5–15.5)
WBC: 7.3 10*3/uL (ref 4.0–10.5)
nRBC: 0 % (ref 0.0–0.2)

## 2018-09-10 NOTE — Progress Notes (Signed)
Occupational Therapy Session Note  Patient Details  Name: [name] MRN: [id number] Date of Birth: 10-Dec-1927  Today's Date: 09/10/2018 OT Individual Time: 1007-1105 OT Individual Time Calculation (min): 58 min    Short Term Goals: Week 1:  OT Short Term Goal 1 (Week 1): Pt will be able to complete a squat pivot to and from [facility] or toilet with mod A of 1. OT Short Term Goal 2 (Week 1): Pt will be able to pull pants down to prepare for toileting with min A. OT Short Term Goal 3 (Week 1): Pt will be able to pull pants up after toileting with mod A. OT Short Term Goal 4 (Week 1): Pt will be able to don pants over legs with min A. OT Short Term Goal 5 (Week 1): Pt will be able to sit to stand with min A.  Skilled Therapeutic Interventions/Progress Updates:    Pt seen this session to focus on strengthening for transfer skills.  Pt received in recliner and agreeable to therapy.  Attempted a squat pivot to w/c but it was too difficult for him to do this session due to fatigue and he really was not able to lift his hips high enough for a safe transfer.  Used slide board to w/c with min A and then again in the gym to mat with CGA.  Pt self propelled w/c to and from the gym. On therapy mat he worked on UE AROM ex to for back/posture strength, wt shifting ex to enable him to cleanse more easily on the toilet, and lots of pressups using the blocks.  He practiced scooting R and left on the mat with and without the blocks.  Pt opted to stay in the wc with belt alarm on and all needs met.   Therapy Documentation Precautions:  Precautions Precaution Comments: pt with R BKA and splint/shrinker from [employer] Other Brace: splint and shrinker from [employer] Restrictions Weight Bearing Restrictions: Yes RLE Weight Bearing: Non weight bearing  Pain: Pain Assessment Pain Score: 0-No pain  Therapy/Group: Individual Therapy  [place] 09/10/2018, 12:59 PM

## 2018-09-10 NOTE — IPOC Note (Signed)
Overall Plan of Care [facility]) Patient Details Name: [name] MRN: [id number] DOB: [birth year]  Admitting Diagnosis: <principal problem not specified>  Hospital Problems: Active Problems:   Essential hypertension, benign   Right below-knee amputee ([place])   Unable to maintain weight-bearing   Type 2 diabetes mellitus with diabetic peripheral angiopathy with gangrene ([place])   Anemia due to acute blood loss   Stage 3 chronic kidney disease ([facility])     Functional Problem List: Nursing Bladder, Bowel, Endurance, Pain, Safety, Medication Management, Skin Integrity  PT Balance, Edema, Endurance, Motor, Pain, Safety, Sensory, Skin Integrity  OT Balance, Endurance, Motor, Sensory, Other (Comment)(motor planning with new motor learning)  SLP    TR         Basic ADL's: OT Toileting, Dressing, Bathing     Advanced  ADL's: OT       Transfers: PT Bed Mobility, Bed to Chair, Car, Furniture, Futures trader, Metallurgist: PT Ambulation, Emergency planning/management officer, Stairs     Additional Impairments: OT None  SLP        TR      Anticipated Outcomes Item Anticipated Outcome  Self Feeding no goal, pt is I  Swallowing      Basic self-care  CGA  Toileting  CGA   Bathroom Transfers CGA  Bowel/Bladder  continent of bowel and bladder with min assist  Transfers  CGA for safety and balance  Locomotion  min A 10 feet using LRAD  Communication     Cognition     Pain  pain level less than 4.  Safety/Judgment  remain free of injuries. prevent falls with mod I.   Therapy Plan: PT Intensity: Minimum of 1-2 x/day ,45 to 90 minutes PT Frequency: 5 out of 7 days PT Duration Estimated Length of Stay: 10-14 days OT Intensity: Minimum of 1-2 x/day, 45 to 90 minutes OT Frequency: 5 out of 7 days OT Duration/Estimated Length of Stay: 12-14 days     Due to the current state of emergency, patients may not be receiving their 3-hours of Medicare-mandated therapy.   Team  Interventions: Nursing Interventions Patient/Family Education, Skin Care/Wound Management, Bladder Management, Bowel Management, Pain Management, Medication Management, Psychosocial Support  PT interventions Ambulation/gait training, Discharge planning, Functional mobility training, Psychosocial support, Therapeutic Activities, Balance/vestibular training, Disease management/prevention, Neuromuscular re-education, Skin care/wound management, Therapeutic Exercise, Wheelchair propulsion/positioning, DME/adaptive equipment instruction, Pain management, Splinting/orthotics, UE/LE Strength taining/ROM, Community reintegration, Technical sales engineer stimulation, Patient/family education, IT trainer, UE/LE Coordination activities  OT Interventions Training and development officer, Discharge planning, DME/adaptive equipment instruction, Functional mobility training, Pain management, Psychosocial support, Patient/family education, Self Care/advanced ADL retraining, Therapeutic Activities, Therapeutic Exercise, UE/LE Strength taining/ROM  SLP Interventions    TR Interventions    SW/CM Interventions Discharge Planning, Psychosocial Support, Patient/Family Education   Barriers to Discharge MD  Medical stability, Wound care, Lack of/limited family support, Weight bearing restrictions, Medication compliance, Nutritional means and BKA  Nursing      PT Lack of/limited family support, Wound Care Currently unsure of d/c destination, patient's home with his son providing 24/7 assist vs daughter's home. Wound vac applied with no drainage in vac  OT      SLP      SW       Team Discharge Planning: Destination: PT-Home ,OT- Home , SLP-  Projected Follow-up: PT-Home health PT, OT-  Home health OT, SLP-  Projected Equipment Needs: PT-To be determined, OT- 3 in 1 bedside comode, Tub/shower seat, To be determined,  SLP-  Equipment Details: PT-has RW, states his w/c is "not in good shape", OT-  Patient/family involved  in discharge planning: PT- Patient,  OT-Patient, SLP-   MD ELOS: 10-14 days Medical Rehab Prognosis:  Good Assessment: Pt is an 85 yr old male with hx of DM on insulin,  with PVD, HTN, BPH, and hypothyoidism, acute blood loss, with recent revascularization procedure to try and save RLE, however required a R BKA- s/p wound VAC for wound healing- here for rehab for R BKA- NWB on RLE.    See Team Conference Notes for weekly updates to the plan of care

## 2018-09-10 NOTE — Progress Notes (Signed)
[facility] PHYSICAL MEDICINE & REHABILITATION PROGRESS NOTE   Subjective/Complaints:  Pt reports no pain again today, in spite of removal of wound VAC since yesterday.  Has dressing RLE - according to PA and OT, should have on black "sock" and no dressing.   LBM last night.  Objective:   No results found. Recent Labs    09/08/18 0616 09/10/18 0659  WBC 7.1 7.3  HGB 8.9* 9.0*  HCT 26.8* 27.5*  PLT 276 274   Recent Labs    09/08/18 0616 09/10/18 0659  NA 132* 131*  K 3.8 3.9  CL 101 101  CO2 24 22  GLUCOSE 118* 109*  BUN 16 14  CREATININE 0.98 1.10  CALCIUM 8.4* 8.6*    Intake/Output Summary (Last 24 hours) at 09/10/2018 0853 Last data filed at 09/10/2018 0740 Gross per 24 hour  Intake 480 ml  Output 1950 ml  Net -1470 ml     Physical Exam: Vital Signs Blood pressure (!) 127/52, pulse 65, temperature 97.7 F (36.5 C), temperature source Oral, resp. rate 18, height 5\' 11"  (1.803 m), weight 106 kg, SpO2 97 %.   Physical Exam Vitals and labs reviewed Constitutional: awake, alert, appropriate, sitting up in manual w/c at sink, OT in room, NAD HENT:  Head:Normocephalicand atraumatic. Healing sore on lip-healing slwoly Eyes:Pupils are equal, round, and reactive to light.EOMare normal.  Neck:Normal range of motion.No tracheal deviationpresent. No thyromegalypresent.  Cardiovascular:Normal rate. Murmurheard. IRR IRR still Respiratory:CTA B/L - very clear today- good air movement B/L YP:PJKD. NT, ND, (+)BS Musculoskeletal:Normal range of motion.  Comments: RLE in kerlex dressing- undressed-as below; mild generalized muscle atrophy noted Neurological: He is alertand oriented to person, place, and time. Patient is alert. He is a bit hard of hearing. Makes good eye contact with examiner.   UE 5/5. RLE: can lift against gravity. LLE: 3/5 HF, 4/5 KE, ADF/PF. No focal sensory findings.  Skin: He isnot diaphoretic. Trace edema of  LLE RLE mild to moderate edema, dog ears /side flaps are coming in on sides suitably; small amount of dried blood on incision; staples intact; no active weeping/drainage seen. Psychiatric: He has a normal mood and affect. Bright affect      Assessment/Plan: 1. 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  Physiatrist is providing close team supervision and 24 hour management of active medical problems listed below.  Physiatrist and rehab team continue to assess barriers to discharge/monitor patient progress toward functional and medical goals  Care Tool:  Bathing    Body parts bathed by patient: Right arm, Left arm, Chest, Abdomen, Front perineal area, Right upper leg, Left upper leg, Face   Body parts bathed by helper: Left lower leg, Buttocks Body parts n/a: Right lower leg   Bathing assist Assist Level: Minimal Assistance - Patient > 75%     Upper Body Dressing/Undressing Upper body dressing   What is the patient wearing?: Pull over shirt    Upper body assist Assist Level: Set up assist    Lower Body Dressing/Undressing Lower body dressing      What is the patient wearing?: Incontinence brief, Pants     Lower body assist Assist for lower body dressing: Maximal Assistance - Patient 25 - 49%     Toileting Toileting    Toileting assist Assist for toileting: Maximal Assistance - Patient 25 -  49%     Transfers Chair/bed transfer  Transfers assist     Chair/bed transfer assist level: Minimal Assistance - Patient > 75% Chair/bed transfer assistive device: Sliding board   Locomotion Ambulation   Ambulation assist   Ambulation activity did not occur: Safety/medical concerns(decreased strength/activity tolerance)          Walk 10 feet activity   Assist  Walk 10 feet activity did  not occur: Safety/medical concerns(decreased strength/activity tolerance)        Walk 50 feet activity   Assist Walk 50 feet with 2 turns activity did not occur: Safety/medical concerns(decreased strength/activity tolerance)         Walk 150 feet activity   Assist Walk 150 feet activity did not occur: Safety/medical concerns(decreased strength/activity tolerance)         Walk 10 feet on uneven surface  activity   Assist Walk 10 feet on uneven surfaces activity did not occur: Safety/medical concerns(decreased strength/activity tolerance)         Wheelchair     Assist Will patient use wheelchair at discharge?: Yes Type of Wheelchair: Manual    Wheelchair assist level: Set up assist, Supervision/Verbal cueing Max wheelchair distance: 150'    Wheelchair 50 feet with 2 turns activity    Assist        Assist Level: Set up assist, Supervision/Verbal cueing   Wheelchair 150 feet activity     Assist  CBG (last 3)  Recent Labs    09/09/18 1717 09/09/18 2127 09/10/18 0626  GLUCAP 139* 129* 104*   Wheelchair 150 feet activity did not occur: Safety/medical concerns(decreased strength/activity tolerance)   Assist Level: Set up assist, Supervision/Verbal cueing   Blood pressure (!) 127/52, pulse 65, temperature 97.7 F (36.5 C), temperature source Oral, resp. rate 18, height 5\' 11"  (1.803 m), weight 106 kg, SpO2 97 %.  Medical Problem List and Plan: 1.Decreased functional mobilitysecondary to right transtibial amputation 08/28/2018 as well as recent right lower extremity revascularization procedure. -admit to inpatient rehab -continue Wound VAC as directed(he is 10 days  post-op)--dc soon?--check with ortho  8/25- will call Ortho tomorrow- give them a chance to come  by.  8/26- wound VAC removed today- OK'd by surgery  8/27- changed dressing to black "sock"- no dressing since no drainage. 2.  Antithrombotics: -DVT/anticoagulation:Lovenox -antiplatelet therapy: Aspirin 81 mg daily, Plavix 75 mg daily 3. Pain Management:Robaxin andoxycodoneas needed.  8/25- pt denies pain, but didn't say if b/c took meds.  8/26- refusing pain meds 4. Mood:Provide emotional support -antipsychotic agents: N/A 5. Neuropsych: This patientiscapable of making decisions on hisown behalf. 6. Skin/Wound Care:continue prevena vac per ortho. He is POD #10---dc soon?  8/26- removed today 7. Fluids/Electrolytes/Nutrition:Routine in and outs with follow-up chemistrieson admit.  8/25 Na slightly better from 130 to 132.  8/27- Na 131- stable 8.ABLA.Follow-up CBC  8/25- Drop from 9.4 to 8.9- will recheck Thursday.  8/27- Hb 9.0 9. Hypertension. Cardura 2 mg nightly. Monitor with increased mobility  8/25- BP spike this AM to 160s/66- will monitor  8/27- BP well controlled  10. Diabetes mellitus, hemoglobin A1c 7.0. SSI. Check blood sugars before meals and at bedtime. Lantus insulin 10 units daily. Patient on Glucotrol 5 mg twice daily prior to admission.  -Resume glucotrol as needed -fair control at present.  8/25- [phone]- fairly well controlled  8/27- low to mid 100s 11. BPH. Proscar 5 mg daily,Flomax 0.4 mg twice daily. Check PVR 12. Hypothyroidism. Synthroid 13. Constipation. Laxative assistance  8/25- LBM  yesterday  8/26- LBM yesterday afternoon 14.Hyperlipidemia. Lipitor  15. Dispo-   8/26- team conference today- therapy wants limb guard off prn- since wound VAC is off, no need.  -set d/c date as 9/8 based on min-max assist with therapies  8/27- IPOC done LOS: 3 days A FACE TO FACE EVALUATION WAS PERFORMED  [name] 09/10/2018, 8:53 AM

## 2018-09-10 NOTE — Progress Notes (Signed)
Physical Therapy Session Note  Patient Details  Name: [name] MRN: [id number] Date of Birth: 1927/04/13  Today's Date: 09/10/2018 PT Individual Time: 0300-9233 PT Individual Time Calculation (min): 55 min   Short Term Goals: Week 1:  PT Short Term Goal 1 (Week 1): Patient will perform bed mobility with supervision. PT Short Term Goal 2 (Week 1): Patient will perform basic transfers with min A. PT Short Term Goal 3 (Week 1): Patient will perform dynamic standing balance activities with min A. PT Short Term Goal 4 (Week 1): Patient will initiate ambulation. PT Short Term Goal 5 (Week 1): Patient will propel w/c 75 feet without a rest break.  Skilled Therapeutic Interventions/Progress Updates:   Pt received sitting in WC and agreeable to PT. WC mobility x 60f with supervision assist into rehab gym, min cues for safety in turns and doorway management. Sit<>stand in parallel bars x 3 with mod assist to push to standing. CGA to maintain balance in parallel bars 3 x 2 min each. Attempted to perform pregait stepping task, but pt unable to WB only through UE to advance the LLE. Dynamic WC mobility to weave through 6 cones x 2. Min-mod cues for safety in turns and improve technique to increased turning radius. Additional WC mobility back to room after short break with supervision assist x 1074f Patient returned to room and [facility] in reach and all needs met.         Therapy Documentation Precautions:  Precautions Precaution Comments: pt with R BKA and splint/shrinker from [employer] Other Brace: splint and shrinker from [employer] Restrictions Weight Bearing Restrictions: Yes RLE Weight Bearing: Non weight bearing Pain: Pain Assessment Pain Score: 0-No pain    Therapy/Group: Individual Therapy  AuLorie Phenix/27/2020, 3:17 PM

## 2018-09-10 NOTE — Progress Notes (Signed)
Occupational Therapy Session Note  Patient Details  Name: [name] MRN: [id number] Date of Birth: 1928-01-09  Today's Date: 09/10/2018 OT Individual Time: 0700-0815 OT Individual Time Calculation (min): 75 min    Short Term Goals: Week 1:  OT Short Term Goal 1 (Week 1): Pt will be able to complete a squat pivot to and from [facility] or toilet with mod A of 1. OT Short Term Goal 2 (Week 1): Pt will be able to pull pants down to prepare for toileting with min A. OT Short Term Goal 3 (Week 1): Pt will be able to pull pants up after toileting with mod A. OT Short Term Goal 4 (Week 1): Pt will be able to don pants over legs with min A. OT Short Term Goal 5 (Week 1): Pt will be able to sit to stand with min A.  Skilled Therapeutic Interventions/Progress Updates:    Pt in bed eating breakfast upon arrival.  OT intervention with focus on bed mobility, sitting balance, functional transfers, sit<>stand, standing balance, BADL training, activity tolerance, and safety awareness to increase independence with BADLs.  Supine>sit EOB-supervision with HOB elevated and use of bed rails Squat pivot transfer bed>w/c with mod A Sit<>stand at sink X 5 with min A CGA for standing balance with RW at sink; pt able to removed LUE from RW to reach for object on sink and assist with pulling pants up Pt requires assistance threading pants. Squat pivot transfer w/c>recliner with mod A MD removed dressing from RLE and black shrinker and limb guard donned per protocol Pt remained in recliner with belt alarm activated and all needs within reach.  Therapy Documentation Precautions:  Precautions Precaution Comments: pt with R BKA and splint/shrinker from [employer] Other Brace: splint and shrinker from [employer] Restrictions Weight Bearing Restrictions: (P) Yes RLE Weight Bearing: (P) Non weight bearing   Pain: Pain Assessment Pain Score: 0-No pain  Therapy/Group: Individual Therapy  [name] 09/10/2018, 8:33 AM

## 2018-09-10 NOTE — Evaluation (Signed)
Recreational Therapy Assessment and Plan  Patient Details  Name: [name] MRN: [id number] Date of Birth: September 13, 1927 Today's Date: 09/10/2018  Rehab Potential: Good ELOS: discharge 9/8   Assessment  Problem List:      Patient Active Problem List   Diagnosis Date Noted  . Unable to maintain weight-bearing   . Type 2 diabetes mellitus with diabetic peripheral angiopathy with gangrene ([place])   . Anemia due to acute blood loss   . Stage 3 chronic kidney disease ([place])   . Right below-knee amputee ([place]) 09/07/2018  . Gangrene of right foot ([place])   . DNR (do not resuscitate) discussion   . Goals of care, counseling/discussion   . Palliative care by specialist   . Type 2 diabetes mellitus with foot ulcer and gangrene ([place])   . Severe protein-calorie malnutrition ([place])   . Ischemic foot 08/20/2018  . Critical lower limb ischemia 08/03/2018  . Type 2 diabetes mellitus with foot ulcer, without long-term current use of insulin ([place])   . Gastroesophageal reflux disease   . Cellulitis of great toe of right foot 05/29/2018  . Atrial fibrillation, chronic   . Chest pain 07/25/2017  . PAF (paroxysmal atrial fibrillation) ([place]) 07/25/2017  . BPH (benign prostatic hyperplasia) 07/25/2017  . Shortness of breath 05/11/2017  . Hypothyroidism 05/11/2017  . Chronic diastolic CHF (congestive heart failure) ([place]) 05/11/2017  . Aortic valve stenosis 06/12/2016  . RBBB 06/12/2016  . PAD (peripheral artery disease) ([place]) 08/04/2012  . Essential hypertension, benign 08/04/2012  . Hyperlipidemia 08/04/2012  . Dizziness 08/04/2012    Past Medical History:      Past Medical History:  Diagnosis Date  . Arthritis   . Borderline diabetic   . Glaucoma   . Hyperlipidemia   . Hypertension   . PVD (peripheral vascular disease) ([place])   . Sleep apnea    Cpap ordered but doesnt use   Past Surgical History:       Past Surgical History:  Procedure Laterality Date  .  ABDOMINAL AORTOGRAM W/LOWER EXTREMITY N/A 07/27/2018   Procedure: ABDOMINAL AORTOGRAM W/LOWER EXTREMITY;  Surgeon: Lorretta Harp, MD;  Location: [facility];  Service: Cardiovascular;  Laterality: N/A;  . AMPUTATION Right 08/28/2018   Procedure: RIGHT BELOW KNEE AMPUTATION;  Surgeon: [name], MD;  Location: [facility];  Service: Orthopedics;  Laterality: Right;  . APPENDECTOMY  1943  . CATARACT EXTRACTION  2012   x2  . COLONOSCOPY N/A 11/01/2014   Procedure: COLONOSCOPY;  Surgeon: Aviva Signs, MD;  [facility];  Service: Gastroenterology;  Laterality: N/A;  . ESOPHAGOGASTRODUODENOSCOPY N/A 11/01/2014   Procedure: ESOPHAGOGASTRODUODENOSCOPY (EGD);  Surgeon: Aviva Signs, MD;  [facility];  Service: Gastroenterology;  Laterality: N/A;  . HERNIA REPAIR Left   . INGUINAL HERNIA REPAIR Right 03/01/2013   Procedure: RIGHT INGUINAL HERNIORRHAPHY;  Surgeon: Jamesetta So, MD;  Location: [facility];  Service: General;  Laterality: Right;  . INSERTION OF MESH Right 03/01/2013   Procedure: INSERTION OF MESH;  Surgeon: Jamesetta So, MD;  Location: [facility];  Service: General;  Laterality: Right;  . LOWER EXTREMITY ANGIOGRAPHY Right 08/25/2018   Procedure: LOWER EXTREMITY ANGIOGRAPHY;  Surgeon: Wellington Hampshire, MD;  Location: [facility];  Service: Cardiovascular;  Laterality: Right;  . NM MYOCAR PERF WALL MOTION  06/05/2010   no significant ischemia  . PERIPHERAL VASCULAR ATHERECTOMY Right 08/03/2018   Procedure: PERIPHERAL VASCULAR ATHERECTOMY;  Surgeon: Lorretta Harp, MD;  Location: [facility];  Service: Cardiovascular;  Laterality: Right;  . stent  06/14/2010   left leg  . US ECHOCARDIOGRAPHY  01/21/2006   moderate mitral annular ca+, mild MR, AOV moderately sclerotic.    Assessment & Plan Clinical Impression: Patient is a 85 y.o. year old right-handed male with history of diabetes mellitus, hypertension, hyperlipidemia, peripheral  vascular disease maintained on aspirin and Plavix with recent revascularization procedure right lower extremity. Per chart review patient lives alone completely independent up till 2 weeks ago driving doing his own grocery shopping independent ADLs. He does have family in the area who plan to assist as needed on discharge. Presented 08/24/2018 with gangrenous right foot and toe that has progressed over the past month. Limb was not felt to be salvageable and underwent right transtibial amputation 08/28/2018 per Dr. Sharol Given and application of wound VAC. Splint and shrinker provided by [employer] prosthetics. Hospital course pain management. Subcutaneous Lovenox for DVT prophylaxis. Acute blood loss anemia7-6 8.2and has received 2 units packed red blood cells with mild leukocytosis 12,500.Mild hyponatremia 71moitored.Intermittent bouts of confusion felt to be related to narcotics/multifactorial.Palliative care did follow-up to establish goals of care. Therapy evaluation completed and patient was admitted for a comprehensive rehab program. Patient transferred to CIR on 09/07/2018 .   Pt presents with decreased activity tolerance, decreased functional mobility, decreased balance Limiting pt's independence with leisure/community pursuits .  Pt tearful during eval stating he "has lived a good life."  Emotional support provided.  Leisure History/Participation Premorbid leisure interest/current participation: CMedical laboratory scientific officer- SPsychologist, forensicNature - Lawn care;Sports - Golf Other Leisure Interests: Television Leisure Participation Style: With Family/Friends;Alone Awareness of Community Resources: Good-identify 3 post discharge leisure resources Psychosocial / Spiritual Social interaction - Mood/Behavior: Cooperative CAcademic librarianAppropriate for Education?: Yes Strengths/Weaknesses Patient Strengths/Abilities: Willingness to participate;Active premorbidly Patient  weaknesses: Physical limitations TR Patient demonstrates impairments in the following area(s): Endurance;Motor;Safety;Skin Integrity  Plan Rec Therapy Plan Is patient appropriate for Therapeutic Recreation?: Yes Rehab Potential: Good Treatment times per week: Min 1 TR session >20 minutes per week during LOS Estimated Length of Stay: discharge 9/8 TR Treatment/Interventions: Adaptive equipment instruction;Community reintegration;Patient/family education;1:1 session;Functional mobility training;UE/LE Coordination activities;Balance/vestibular training;Recreation/leisure participation;Therapeutic activities;Wheelchair propulsion/positioning Recommendations for other services: Neuropsych  Recommendations for other services: Neuropsych  Discharge Criteria: Patient will be discharged from TR if patient refuses treatment 3 consecutive times without medical reason.  If treatment goals not met, if there is a change in medical status, if patient makes no progress towards goals or if patient is discharged from hospital.  The above assessment, treatment plan, treatment alternatives and goals were discussed and mutually agreed upon: by patient  SLas [place] II8/27/2020, 12:24 PM

## 2018-09-11 ENCOUNTER — Inpatient Hospital Stay (HOSPITAL_COMMUNITY): Payer: Medicare Other | Admitting: Occupational Therapy

## 2018-09-11 ENCOUNTER — Inpatient Hospital Stay (HOSPITAL_COMMUNITY): Payer: Medicare Other | Admitting: Physical Therapy

## 2018-09-11 DIAGNOSIS — M79672 Pain in left foot: Secondary | ICD-10-CM

## 2018-09-11 LAB — GLUCOSE, CAPILLARY
Glucose-Capillary: 109 mg/dL — ABNORMAL HIGH (ref 70–99)
Glucose-Capillary: 100 mg/dL — ABNORMAL HIGH (ref 70–99)
Glucose-Capillary: 172 mg/dL — ABNORMAL HIGH (ref 70–99)
Glucose-Capillary: 129 mg/dL — ABNORMAL HIGH (ref 70–99)

## 2018-09-11 NOTE — Plan of Care (Signed)
  Problem: RH SKIN INTEGRITY Goal: RH STG MAINTAIN SKIN INTEGRITY WITH ASSISTANCE Description: STG Maintain Skin Integrity With Minimal Assistance. Outcome: Progressing   Problem: RH SAFETY Goal: RH STG ADHERE TO SAFETY PRECAUTIONS W/ASSISTANCE/DEVICE Description: STG Adhere to Safety Precautions With minimal Assistance/Device. Outcome: Progressing Goal: RH STG DECREASED RISK OF FALL WITH ASSISTANCE Description: STG Decreased Risk of Fall With minimal Assistance. Outcome: Progressing   Problem: RH PAIN MANAGEMENT Goal: RH STG PAIN MANAGED AT OR BELOW PT'S PAIN GOAL Description: Pain will be maintained at or below 4/10.  Outcome: Progressing

## 2018-09-11 NOTE — Progress Notes (Signed)
Physical Therapy Session Note  Patient Details  Name: [name] MRN: [id number] Date of Birth: 04-04-1927  Today's Date: 09/11/2018 PT Individual Time: 1415-1500 PT Individual Time Calculation (min): 45 min   Short Term Goals: Week 1:  PT Short Term Goal 1 (Week 1): Patient will perform bed mobility with supervision. PT Short Term Goal 2 (Week 1): Patient will perform basic transfers with min A. PT Short Term Goal 3 (Week 1): Patient will perform dynamic standing balance activities with min A. PT Short Term Goal 4 (Week 1): Patient will initiate ambulation. PT Short Term Goal 5 (Week 1): Patient will propel w/c 75 feet without a rest break.  Skilled Therapeutic Interventions/Progress Updates:   Pt received sitting in WC and agreeable to PT. Pt transported to rehab gym in [facility]. SB transfer to mat table with min assist and min cues for improved anterior weight shift and UE lpacement to prevement forward LOB. Sit>supine with supervision assist and increased time.   Supine/sidelying therex.  SAQ x 12 BLE.  Bridge through thighs on bolster x 10  Hip abduction x 12  LLE ankle PF with level 1 tband x 15  sidelying  hip extension x 12 sidelying  hip abduction x 10  Seated LAQ BLE x 10  Cues for full ROM, decreased trunk compensations, and deceased speed of eccentric movement as tolerated.   SB transfer back to [facility] with min assist min cues for set up and improved UE placement to prevent LOB.   WC mobility through hall x 122f, supevision assist overall with min cues for safety through doorways. [facility] returned to [facility] and [facility] and all needs met.         Therapy Documentation Precautions:  Precautions Precaution Comments: pt with R BKA and splint/shrinker from [employer] Other Brace: splint and shrinker from [employer] Restrictions Weight Bearing Restrictions: Yes RLE Weight Bearing: Non weight bearing   Pain: Pain Assessment Pain Score: 0-No  pain    Therapy/Group: Individual Therapy  ALorie Phenix8/28/2020, 3:08 PM

## 2018-09-11 NOTE — Progress Notes (Signed)
PHYSICAL MEDICINE & REHABILITATION PROGRESS NOTE   Subjective/Complaints:  Pt reports has pain in L heel with standing/trying to walk- it's new. Hard to stand due to pain- encouraged pt to take tylenol, at minimum for pain.   Objective:   No results found. Recent Labs    09/10/18 0659  WBC 7.3  HGB 9.0*  HCT 27.5*  PLT 274   Recent Labs    09/10/18 0659  NA 131*  K 3.9  CL 101  CO2 22  GLUCOSE 109*  BUN 14  CREATININE 1.10  CALCIUM 8.6*    Intake/Output Summary (Last 24 hours) at 09/11/2018 1213 Last data filed at 09/11/2018 0806 Gross per 24 hour  Intake 462 ml  Output 1400 ml  Net -938 ml     Physical Exam: Vital Signs Blood pressure 126/60, pulse (!) 59, temperature 97.6 F (36.4 C), temperature source Oral, resp. rate 18, height 5\' 11"  (1.803 m), weight 106 kg, SpO2 99 %.   Physical Exam Vitals and labs reviewed Constitutional: awake, alert, appropriate, sitting up in bed, new OT  In room, NAD HENT:  Head:Normocephalicand atraumatic. Healing sore on lip-healing slwoly Eyes:Pupils are equal, round, and reactive to light.EOMare normal.  Neck:Normal range of motion.No tracheal deviationpresent. No thyromegalypresent.  Cardiovascular:Normal rate. Murmurheard. IRR IRR still Respiratory:CTA B/L - very clear today- good air movement B/L WC:HENI. NT, ND, (+)BS Musculoskeletal:Normal range of motion.  Comments: RLE in limbguard today  No TTP over L heel/arch or ball of foot on L Neurological: He is alertand oriented to person, place, and time. Patient is alert. He is a bit hard of hearing. Makes good eye contact with examiner.   UE 5/5. RLE: can lift against gravity. LLE: 3/5 HF, 4/5 KE, ADF/PF. No focal sensory findings.  Skin: He isnot diaphoretic. Trace edema of LLE RLE mild to moderate edema, dog ears /side flaps are coming in on sides suitably; small amount of dried blood on incision; staples intact; no active  weeping/drainage seen. L heel on back slightly boggy- no skin breakdown- pt reports pain on BACK of heel, not bottom Psychiatric: He has a normal mood and affect. Bright affect      Assessment/Plan: 1. 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  Physiatrist is providing close team supervision and 24 hour management of active medical problems listed below.  Physiatrist and rehab team continue to assess barriers to discharge/monitor patient progress toward functional and medical goals  Care Tool:  Bathing    Body parts bathed by patient: Right arm, Left arm, Chest, Abdomen, Front perineal area, Right upper leg, Left upper leg, Face   Body parts bathed by helper: Buttocks, Left lower leg Body parts n/a: Right lower leg   Bathing assist Assist Level: Minimal Assistance - Patient > 75%     Upper Body Dressing/Undressing Upper body dressing   What is the patient wearing?: Pull over shirt    Upper body assist Assist Level: Set up assist    Lower Body Dressing/Undressing Lower body dressing      What is the patient wearing?: Incontinence brief, Pants     Lower body assist Assist for lower body dressing: Maximal Assistance - Patient 25 - 49%     Toileting Toileting    Toileting assist Assist for toileting: Maximal Assistance - Patient 25 - 49%  Transfers Chair/bed transfer  Transfers assist     Chair/bed transfer assist level: Minimal Assistance - Patient > 75% Chair/bed transfer assistive device: Sliding board   Locomotion Ambulation   Ambulation assist   Ambulation activity did not occur: Safety/medical concerns(decreased strength/activity tolerance)          Walk 10 feet activity   Assist  Walk 10 feet activity did not occur: Safety/medical concerns(decreased  strength/activity tolerance)        Walk 50 feet activity   Assist Walk 50 feet with 2 turns activity did not occur: Safety/medical concerns(decreased strength/activity tolerance)         Walk 150 feet activity   Assist Walk 150 feet activity did not occur: Safety/medical concerns(decreased strength/activity tolerance)         Walk 10 feet on uneven surface  activity   Assist Walk 10 feet on uneven surfaces activity did not occur: Safety/medical concerns(decreased strength/activity tolerance)         Wheelchair     Assist Will patient use wheelchair at discharge?: Yes Type of Wheelchair: Manual    Wheelchair assist level: Set up assist, Supervision/Verbal cueing Max wheelchair distance: 150'    Wheelchair 50 feet with 2 turns activity    Assist        Assist Level: Set up assist, Supervision/Verbal cueing   Wheelchair 150 feet activity     Assist  CBG (last 3)  Recent Labs    09/10/18 1635 09/10/18 2049 09/11/18 0605  GLUCAP 136* 171* 109*   Wheelchair 150 feet activity did not occur: Safety/medical concerns(decreased strength/activity tolerance)   Assist Level: Set up assist, Supervision/Verbal cueing   Blood pressure 126/60, pulse (!) 59, temperature 97.6 F (36.4 C), temperature source Oral, resp. rate 18, height 5\' 11"  (1.803 m), weight 106 kg, SpO2 99 %.  Medical Problem List and Plan: 1.Decreased functional mobilitysecondary to right transtibial amputation 08/28/2018 as well as recent right lower extremity revascularization procedure. -admit to inpatient rehab -continue Wound VAC as directed(he is 10 days  post-op)--dc soon?--check with ortho  8/25- will call Ortho tomorrow- give them a chance to come  by.  8/26- wound VAC removed today- OK'd by surgery  8/27- changed dressing to black "sock"- no dressing since no drainage. 2.  Antithrombotics: -DVT/anticoagulation:Lovenox -antiplatelet therapy: Aspirin 81 mg daily, Plavix 75 mg daily 3. Pain Management:Robaxin andoxycodoneas needed.  8/25- pt denies pain, but didn't say if b/c took meds.  8/26- refusing pain meds  8/28- encouraged pt to take tylenol at least for L heel pain 4. Mood:Provide emotional support -antipsychotic agents: N/A 5. Neuropsych: This patientiscapable of making decisions on hisown behalf. 6. Skin/Wound Care:continue prevena vac per ortho. He is POD #10---dc soon?  8/26- removed today 7. Fluids/Electrolytes/Nutrition:Routine in and outs with follow-up chemistrieson admit.  8/25 Na slightly better from 130 to 132.  8/27- Na 131- stable 8.ABLA.Follow-up CBC  8/25- Drop from 9.4 to 8.9- will recheck Thursday.  8/27- Hb 9.0 9. Hypertension. Cardura 2 mg nightly. Monitor with increased mobility  8/25- BP spike this AM to 160s/66- will monitor  8/27- BP well controlled  10. Diabetes mellitus, hemoglobin A1c 7.0. SSI. Check blood sugars before meals and at bedtime. Lantus insulin 10 units daily. Patient on Glucotrol 5 mg twice daily prior to admission.  -Resume glucotrol as needed -fair control at present.  8/25- 161-096- fairly well controlled  8/27- low to mid 100s 11. BPH. Proscar 5 mg daily,Flomax 0.4 mg twice daily. Check PVR 12. Hypothyroidism.  Synthroid 13. Constipation. Laxative assistance  8/25- LBM yesterday  8/26- LBM yesterday afternoon 14.Hyperlipidemia. Lipitor  15. L heel pain  8/28- will have pt wear shoe, not sock to give more support and do heel protection if needed- con't to monitor- suggested tylenol prn  16 Dispo-   8/26- team conference today- therapy wants limb guard off prn- since wound VAC is off, no need.  -set d/c date as 9/8 based on min-max assist with therapies  8/27- IPOC done LOS: 4 days A FACE TO FACE EVALUATION WAS  PERFORMED  Jayci Ellefson 09/11/2018, 12:13 PM

## 2018-09-11 NOTE — Progress Notes (Signed)
Occupational Therapy Session Note  Patient Details  Name: [name] MRN: [id number] Date of Birth: 05/26/1927  Today's Date: 09/11/2018 OT Individual Time: 1050-1220 OT Individual Time Calculation (min): 90 min    Short Term Goals: Week 1:  OT Short Term Goal 1 (Week 1): Pt will be able to complete a squat pivot to and from [facility] or toilet with mod A of 1. OT Short Term Goal 2 (Week 1): Pt will be able to pull pants down to prepare for toileting with min A. OT Short Term Goal 3 (Week 1): Pt will be able to pull pants up after toileting with mod A. OT Short Term Goal 4 (Week 1): Pt will be able to don pants over legs with min A. OT Short Term Goal 5 (Week 1): Pt will be able to sit to stand with min A.  Skilled Therapeutic Interventions/Progress Updates:     Pt received in w/c ready for therapy.  Provided pt with a w/c back support the back of the w/c had too much sling.   Therapeutic Activity/ Exercise: -self propelled in w/c to gym -used Sci fit arm bike at resistance 4 for 10 minutes -on therapy mat - UE rowing exercises and arm extension with level 3  thera band and overhead sh flex with 3 # dowel bar -from w/c, chair push ups 10x with gradually improving hip lift  Transfers:  -with slide board w/c to mat to his L side = S -Mat to w/c without board = min A scooting  -W/c to BSC over toilet with bars = min A scooting -BSC to w/c = Mod A with +2 for safety as pt was having difficulty elevating hips over edge of w/c cushion  Pt is doing better with his forward lean but still having difficulty with his head hip orientation to be able to elevate his hips high enough.   Balance: Sit to stand at parallel bars with B hands on bars = min s Stood at bars for 35 sec and then 1.5 minutes with min A and B hands on bars  ADLs: Pt needed to toilet at end of session,  Once on BSC pt used lateral leans to doff shorts and briefs with min A, self cleanse after BM with supervision, and don  shorts with mod A to pull up. He needs to use a urinal when sitting as he does not "fit" well on the commode.   Pt in room with nurse tech who planned to set him up with his belt, lunch, etc.    Therapy Documentation Precautions:  Precautions Precaution Comments: pt with R BKA and splint/shrinker from [employer] Other Brace: splint and shrinker from [employer] Restrictions Weight Bearing Restrictions: Yes RLE Weight Bearing: Non weight bearing     Pain: Pain Assessment Pain Score: 0-No pain   Therapy/Group: Individual Therapy  [place] 09/11/2018, 11:13 AM

## 2018-09-11 NOTE — Progress Notes (Signed)
Occupational Therapy Session Note  Patient Details  Name: [name] MRN: [id number] Date of Birth: 05-12-1927  Today's Date: 09/11/2018 OT Individual Time: 0745-0900 OT Individual Time Calculation (min): 75 min    Short Term Goals: Week 1:  OT Short Term Goal 1 (Week 1): Pt will be able to complete a squat pivot to and from [facility] or toilet with mod A of 1. OT Short Term Goal 2 (Week 1): Pt will be able to pull pants down to prepare for toileting with min A. OT Short Term Goal 3 (Week 1): Pt will be able to pull pants up after toileting with mod A. OT Short Term Goal 4 (Week 1): Pt will be able to don pants over legs with min A. OT Short Term Goal 5 (Week 1): Pt will be able to sit to stand with min A.  Skilled Therapeutic Interventions/Progress Updates:    Treatment session with focus on self-care retraining with lateral leans, functional transfers, and sit > stand.  Pt received in bed with MD present.  Discussed "boggy" heel with pain, MD recommended pt wearing supportive shoe and may order a specialty shoe and decreased WB this date.  Min assist bed mobility.  Shoe donned prior to transfers to decrease pressure with standing instances.  Slide board transfer bed > w/c to minimize WB with min assist and assist for placement of board. Engaged in bathing and dressing at sink.  Completed grooming in sitting with setup for items.  Pt completed sit> stand x2 with min assist and 3rd attempt with mod assist due to fatigue.  Completed LB bathing and dressing with assistance to wash buttocks and pull pants over hips while pt maintained standing.  Pt able to maintain standing balance with single UE to doff pants but due to fatigue unable to pull pants over hips in standing.  Pt remained upright in w/c with seat belt alarm on and all needs in reach.  Therapy Documentation Precautions:  Precautions Precaution Comments: pt with R BKA and splint/shrinker from [employer] Other Brace: splint and shrinker from  [employer] Restrictions Weight Bearing Restrictions: Yes RLE Weight Bearing: Non weight bearing Pain:  Pt with c/o pain in Lt foot.  MD aware - encouraged wearing supportive shoe and decreased WB this session.   Therapy/Group: Individual Therapy  Simonne Come 09/11/2018, 9:41 AM

## 2018-09-12 ENCOUNTER — Inpatient Hospital Stay (HOSPITAL_COMMUNITY): Payer: Medicare Other | Admitting: Occupational Therapy

## 2018-09-12 ENCOUNTER — Inpatient Hospital Stay (HOSPITAL_COMMUNITY): Payer: Medicare Other | Admitting: Physical Therapy

## 2018-09-12 ENCOUNTER — Inpatient Hospital Stay (HOSPITAL_COMMUNITY): Payer: Medicare Other

## 2018-09-12 LAB — GLUCOSE, CAPILLARY
Glucose-Capillary: 99 mg/dL (ref 70–99)
Glucose-Capillary: 122 mg/dL — ABNORMAL HIGH (ref 70–99)
Glucose-Capillary: 121 mg/dL — ABNORMAL HIGH (ref 70–99)
Glucose-Capillary: 147 mg/dL — ABNORMAL HIGH (ref 70–99)

## 2018-09-12 NOTE — Progress Notes (Signed)
Occupational Therapy Session Note  Patient Details  Name: [name] MRN: [id number] Date of Birth: Apr 30, 1927  Today's Date: 09/12/2018 OT Individual Time: 4097-3532 OT Individual Time Calculation (min): 89 min   Short Term Goals: Week 1:  OT Short Term Goal 1 (Week 1): Pt will be able to complete a squat pivot to and from [facility] or toilet with mod A of 1. OT Short Term Goal 2 (Week 1): Pt will be able to pull pants down to prepare for toileting with min A. OT Short Term Goal 3 (Week 1): Pt will be able to pull pants up after toileting with mod A. OT Short Term Goal 4 (Week 1): Pt will be able to don pants over legs with min A. OT Short Term Goal 5 (Week 1): Pt will be able to sit to stand with min A.    Skilled Therapeutic Interventions/Progress Updates:    Pt greeted in bed with no c/o pain. RN finishing up morning medication. He was agreeable to complete BADLs. First donned shorts bedlevel in prep for slideboard transfer. Unable to complete 1 legged bridge, however able to roll Rt>Lt for OT to elevate pants over hips. Supine<sit completed with Min A and Min A for placement of slideboard and steadying of board during transfer to w/c. He required setup for UB self care w/c level at the sink and also for oral care/grooming tasks. Mod A for sit<stand to lower shorts and brief. Pt had a tough time rising up from Lt elbow due to arm collapse during sit<stand transition. During 2nd stand for perihygiene, pt unable extend Lt elbow to push himself into full standing. Max A required to prevent fall. He was able to lean laterally in the w/c for OT to complete perihygiene. B w/c push ups for donning brief and elevating pants over hips. Pt able to maintain buttocks clearance for about thirty seconds while these tasks were completed. He required assist for washing Lt foot. Able to pull fabric of sock over heel once OT started him off 3/4 of the way. Also able to tie his shoe laces with steady assist for  sitting balance. Throughout ADL, had pt lock and unlock his w/c brakes at appropriate times. Worked on pt managing his leg rest and limb rest with overall Mod-Max A and instruction. He would continue to benefit from practice. Also called his dtr [name] to bring in more clothing items. She stated that her brother is appointed visitor though pts chart states that both she and brother are allowed to visit at different times. Notified RN to investigate further and call dtr back. Pt was left comfortably in w/c with all needs within reach and safety belt fastened. Tx focus placed on adaptive self care skills, activity tolerance, and balance.   Therapy Documentation Precautions:  Precautions Precaution Comments: pt with R BKA and splint/shrinker from [employer] Other Brace: splint and shrinker from [employer] Restrictions Weight Bearing Restrictions: Yes RLE Weight Bearing: Non weight bearing Vital Signs: Therapy Vitals Temp: 97.6 F (36.4 C) Temp Source: Oral Pulse Rate: (!) 58 Resp: 19 BP: 133/61 Patient Position (if appropriate): Sitting Oxygen Therapy SpO2: 100 % O2 Device: Room Air Pain: Pain Assessment Pain Scale: Faces Pain Score: 0-No pain Faces Pain Scale: Hurts little more Pain Type: Acute pain Pain Location: Leg Pain Orientation: Right Pain Descriptors / Indicators: Aching Pain Onset: On-going Pain Intervention(s): Medication (See eMAR)(prior to therapy) ADL:       Therapy/Group: Individual Therapy  Denea Cheaney A Schuyler Behan 09/12/2018,  4:08 PM

## 2018-09-12 NOTE — Progress Notes (Signed)
Physical Therapy Session Note  Patient Details  Name: [name] MRN: [id number] Date of Birth: 02-09-1927  Today's Date: 09/12/2018 PT Individual Time: 1100-1200 PT Individual Time Calculation (min): 60 min   Short Term Goals: Week 1:  PT Short Term Goal 1 (Week 1): Patient will perform bed mobility with supervision. PT Short Term Goal 2 (Week 1): Patient will perform basic transfers with min A. PT Short Term Goal 3 (Week 1): Patient will perform dynamic standing balance activities with min A. PT Short Term Goal 4 (Week 1): Patient will initiate ambulation. PT Short Term Goal 5 (Week 1): Patient will propel w/c 75 feet without a rest break.  Skilled Therapeutic Interventions/Progress Updates:     Patient in w/c upon PT arrival. Patient alert and agreeable to PT session. NT present and participated in transfer training/education throughout session. Patient denied pain throughout session. Shrinker and limb guard donned on L residual limb prior to and throughout session.  Therapeutic Activity: Bed Mobility: Patient performed supine to/from sit with supervision on a mat table. Provided verbal cues for sliding LEs off the bed and pushing through elbows to sit up. Transfers: Patient performed slide board transfers R and L to/from a mat table with min A-CGA with total A for board placement. Provided cues for board placement, w/c set up, hand positioning, and head-hips relationship. He performed stand pivot transfers x2 with mod A, demonstrated difficulty with straightening his R LE and pivot technique today. Provided verbal cues for leaning forward to stand, hand set up with RW, reaching back to sit, R knee extension, and pivoting by lifting his heel off the floor. He performed squat pivot transfers x2 with mod-min A with cues for head hips relationship and hand placement.   Wheelchair Mobility:  Patient propelled wheelchair 150 and 75 feet with supervision and set up assist for leg rests.  Provided verbal cues for use of breaks througout session, turning technique, and pushing with B UEs equally to steer straight.   Patient in w/c at end of session with breaks locked, seat belt alarm set, and all needs within reach.    Therapy Documentation Precautions:  Precautions Precaution Comments: pt with R BKA and splint/shrinker from [employer] Other Brace: splint and shrinker from [employer] Restrictions Weight Bearing Restrictions: Yes RLE Weight Bearing: Non weight bearing    Therapy/Group: Individual Therapy  Rourke Mcquitty L Godfrey Tritschler PT, DPT  09/12/2018, 3:54 PM

## 2018-09-12 NOTE — Progress Notes (Signed)
Physical Therapy Session Note  Patient Details  Name: [name] MRN: [id number] Date of Birth: 11/19/1927  Today's Date: 09/12/2018 PT Individual Time: 1535-1620 PT Individual Time Calculation (min): 45 min   Short Term Goals: Week 1:  PT Short Term Goal 1 (Week 1): Patient will perform bed mobility with supervision. PT Short Term Goal 2 (Week 1): Patient will perform basic transfers with min A. PT Short Term Goal 3 (Week 1): Patient will perform dynamic standing balance activities with min A. PT Short Term Goal 4 (Week 1): Patient will initiate ambulation. PT Short Term Goal 5 (Week 1): Patient will propel w/c 75 feet without a rest break.  Skilled Therapeutic Interventions/Progress Updates:    Pt received sitting in w/c and agreeable to therapy session. Performed B UE w/c propulsion ~159ft to therapy gym with supervision focusing on activity tolerance  - propels with decreased speed but able to perform entire distance without a rest break. Pt able to correctly place w/c next to mat for slide board transfer with increased time. Therapist educated pt on w/c parts and management of brakes, leg rests, and arm rest to set-up for transfer. Slide board transfer w/c>EOM with max assist for board placement and min assist for scooting with increased time for pt to reposition between scoots- cuing for increased anterior trunk weight shift and proper UE placement.   Performed the following exercises: - seated R LE long arc quads x20 reps cuing for full knee extension - seated B LE hip abduction against level 1 theraband resistance x20 reps cuing to avoid compensation at L ankle - supine R LE straight leg raise x20 reps with cuing to maintain knee extension throughout movement - supine bridging with R LE on bolster x15 reps  Sit>supine on mat with close supervision and increased time. Supine>sit on mat with min assist for trunk upright. Slide board transfer EOM>w/c with max assist for board  placement and min assist for scooting hips - again requiring cuing and manual facilitation for increased anterior trunk lean. Performed BUE w/c propulsion ~88ft back towards room with supervision and transported remainder of distance for time management. Pt left sitting in w/c with needs in reach and seat belt alarm on.  Therapy Documentation Precautions:  Precautions Precaution Comments: pt with R BKA and splint/shrinker from [employer] Other Brace: splint and shrinker from [employer] Restrictions Weight Bearing Restrictions: Yes RLE Weight Bearing: Non weight bearing  Pain: Denies pain during session.   Therapy/Group: Individual Therapy  Tawana Scale, PT, DPT 09/12/2018, 3:33 PM

## 2018-09-12 NOTE — Progress Notes (Signed)
[facility] PHYSICAL MEDICINE & REHABILITATION PROGRESS NOTE   Subjective/Complaints:  Pt up eating breakfast. No new complaints. Denies new pain  ROS: Patient denies fever, rash, sore throat, blurred vision, nausea, vomiting, diarrhea, cough, shortness of breath or chest pain,   Objective:   No results found. Recent Labs    09/10/18 0659  WBC 7.3  HGB 9.0*  HCT 27.5*  PLT 274   Recent Labs    09/10/18 0659  NA 131*  K 3.9  CL 101  CO2 22  GLUCOSE 109*  BUN 14  CREATININE 1.10  CALCIUM 8.6*    Intake/Output Summary (Last 24 hours) at 09/12/2018 0755 Last data filed at 09/12/2018 0000 Gross per 24 hour  Intake 240 ml  Output 1250 ml  Net -1010 ml     Physical Exam: Vital Signs Blood pressure (!) 132/59, pulse 63, temperature 98.2 F (36.8 C), temperature source Oral, resp. rate 18, height 5\' 11"  (1.803 m), weight 108 kg, SpO2 97 %.   Physical Exam Constitutional: No distress . Vital signs reviewed. HEENT: EOMI, oral membranes moist Neck: supple Cardiovascular:IRR IRR w/ murmur. No JVD    Respiratory: CTA Bilaterally without wheezes or rales. Normal effort    GI: BS +, non-tender, non-distended  Musculoskeletal:Normal range of motion.  Comments: RLE in limbguard still  NO TTP over L heel/arch or ball of foot on L Neurological: alert and appropriate. HOH  UE 5/5. RLE: can lift against gravity. LLE: 3/5 HF, 4/5 KE, ADF/PF. No focal sensory findings.  Skin: He isnot diaphoretic. Trace edema of LLE RLE incision intact. Minimal drainage L heel on back slightly boggy- no skin breakdown-   Psychiatric: pleasant     Assessment/Plan: 1. 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  Physiatrist is providing close team supervision and 24 hour management of active medical problems  listed below.  Physiatrist and rehab team continue to assess barriers to discharge/monitor patient progress toward functional and medical goals  Care Tool:  Bathing    Body parts bathed by patient: Right arm, Left arm, Chest, Abdomen, Front perineal area, Right upper leg, Left upper leg, Face   Body parts bathed by helper: Buttocks, Left lower leg Body parts n/a: Right lower leg   Bathing assist Assist Level: Minimal Assistance - Patient > 75%     Upper Body Dressing/Undressing Upper body dressing   What is the patient wearing?: Pull over shirt    Upper body assist Assist Level: Set up assist    Lower Body Dressing/Undressing Lower body dressing      What is the patient wearing?: Incontinence brief, Pants     Lower body assist Assist for lower body dressing: Maximal Assistance - Patient 25 - 49%     Toileting Toileting    Toileting assist Assist for toileting: Moderate Assistance - Patient 50 - 74%     Transfers Chair/bed transfer  Transfers assist     Chair/bed transfer assist level: Minimal Assistance - Patient > 75% Chair/bed transfer assistive device: Sliding board   Locomotion Ambulation   Ambulation assist   Ambulation activity did not occur: Safety/medical concerns(decreased strength/activity tolerance)          Walk 10 feet activity   Assist  Walk 10 feet activity did not occur: Safety/medical concerns(decreased strength/activity tolerance)        Walk  50 feet activity   Assist Walk 50 feet with 2 turns activity did not occur: Safety/medical concerns(decreased strength/activity tolerance)         Walk 150 feet activity   Assist Walk 150 feet activity did not occur: Safety/medical concerns(decreased strength/activity tolerance)         Walk 10 feet on uneven surface  activity   Assist Walk 10 feet on uneven surfaces activity did not occur: Safety/medical concerns(decreased strength/activity tolerance)          Wheelchair     Assist Will patient use wheelchair at discharge?: Yes Type of Wheelchair: Manual    Wheelchair assist level: Supervision/Verbal cueing Max wheelchair distance: 160    Wheelchair 50 feet with 2 turns activity    Assist        Assist Level: Supervision/Verbal cueing   Wheelchair 150 feet activity     Assist  CBG (last 3)  Recent Labs    09/11/18 1644 09/11/18 2113 09/12/18 0622  GLUCAP 172* 129* 99   Wheelchair 150 feet activity did not occur: Safety/medical concerns(decreased strength/activity tolerance)   Assist Level: Supervision/Verbal cueing   Blood pressure (!) 132/59, pulse 63, temperature 98.2 F (36.8 C), temperature source Oral, resp. rate 18, height 5\' 11"  (1.803 m), weight 108 kg, SpO2 97 %.  Medical Problem List and Plan: 1.Decreased functional mobilitysecondary to right transtibial amputation 08/28/2018 as well as recent right lower extremity revascularization procedure. --Continue CIR therapies including PT, OT  -continue limb guard   8/26- wound VAC removed    8/27- changed dressing to black "sock"- no dressing since no drainage. 2. Antithrombotics: -DVT/anticoagulation:Lovenox -antiplatelet therapy: Aspirin 81 mg daily, Plavix 75 mg daily 3. Pain Management:Robaxin andoxycodoneas needed.  8/25- pt denies pain, but didn't say if b/c took meds.  8/26- refusing pain meds  8/28- encouraged pt to take tylenol at least for L heel pain  8/29--denies pain today 4. Mood:Provide emotional support -antipsychotic agents: N/A 5. Neuropsych: This patientiscapable of making decisions on hisown behalf. 6. Skin/Wound Care:continue prevena vac per ortho. He is POD #10---dc soon?  8/26- vac off 7. Fluids/Electrolytes/Nutrition:Routine in and outs with follow-up chemistrieson admit.  8/25 Na slightly better from 130 to 132.  8/27- Na 131- stable 8.ABLA.Follow-up  CBC  8/25- Drop from 9.4 to 8.9- will recheck Thursday.  8/27- Hb 9.0 9. Hypertension. Cardura 2 mg nightly. Monitor with increased mobility  8/25- BP spike this AM to 160s/66- will monitor  8/29- BP well controlled  10. Diabetes mellitus, hemoglobin A1c 7.0. SSI. Check blood sugars before meals and at bedtime. Lantus insulin 10 units daily. Patient on Glucotrol 5 mg twice daily prior to admission.  -Resume glucotrol as needed -fair control at present.  8/25- [id number]- fairly well controlled  8/29---controlled 11. BPH. Proscar 5 mg daily,Flomax 0.4 mg twice daily. Check PVR 12. Hypothyroidism. Synthroid 13. Constipation. Laxative assistance  bm 8/28 14.Hyperlipidemia. Lipitor  15. L heel pain  8/28- will have pt wear shoe, not sock to give more support and do heel protection if needed- con't to monitor   16 Dispo-   8/26- team conference today- therapy wants limb guard off prn- since wound VAC is off, no need.  -set d/c date as 9/8 based on min-max assist with therapies  8/27- IPOC done LOS: 5 days A FACE TO [place] 09/12/2018, 7:55 AM

## 2018-09-12 NOTE — Plan of Care (Signed)
  Problem: RH SKIN INTEGRITY Goal: RH STG SKIN FREE OF INFECTION/BREAKDOWN Description: Patient will not display any s/sx of infection.  Outcome: Progressing Goal: RH STG MAINTAIN SKIN INTEGRITY WITH ASSISTANCE Description: STG Maintain Skin Integrity With Minimal Assistance. Outcome: Progressing Goal: RH STG ABLE TO PERFORM INCISION/WOUND CARE W/ASSISTANCE Description: STG Able To Perform Incision/Wound Care With minimal Assistance. Outcome: Progressing   Problem: RH SAFETY Goal: RH STG ADHERE TO SAFETY PRECAUTIONS W/ASSISTANCE/DEVICE Description: STG Adhere to Safety Precautions With minimal Assistance/Device. Outcome: Progressing Goal: RH STG DECREASED RISK OF FALL WITH ASSISTANCE Description: STG Decreased Risk of Fall With minimal Assistance. Outcome: Progressing   Problem: RH PAIN MANAGEMENT Goal: RH STG PAIN MANAGED AT OR BELOW PT'S PAIN GOAL Description: Pain will be maintained at or below 4/10.  Outcome: Progressing

## 2018-09-13 ENCOUNTER — Inpatient Hospital Stay (HOSPITAL_COMMUNITY): Payer: Medicare Other

## 2018-09-13 ENCOUNTER — Inpatient Hospital Stay (HOSPITAL_COMMUNITY): Payer: Medicare Other | Admitting: Physical Therapy

## 2018-09-13 LAB — GLUCOSE, CAPILLARY
Glucose-Capillary: 74 mg/dL (ref 70–99)
Glucose-Capillary: 128 mg/dL — ABNORMAL HIGH (ref 70–99)
Glucose-Capillary: 167 mg/dL — ABNORMAL HIGH (ref 70–99)
Glucose-Capillary: 131 mg/dL — ABNORMAL HIGH (ref 70–99)

## 2018-09-13 NOTE — Progress Notes (Signed)
[facility] PHYSICAL MEDICINE & REHABILITATION PROGRESS NOTE   Subjective/Complaints:  No new issues. Up with therapy and rolling to gym  ROS: Patient denies fever, rash, sore throat, blurred vision, nausea, vomiting, diarrhea, cough, shortness of breath or chest pain,   headache, or mood change.    Objective:   No results found. No results for input(s): WBC, HGB, HCT, PLT in the last 72 hours. No results for input(s): NA, K, CL, CO2, GLUCOSE, BUN, CREATININE, CALCIUM in the last 72 hours.  Intake/Output Summary (Last 24 hours) at 09/13/2018 1007 Last data filed at 09/13/2018 0753 Gross per 24 hour  Intake 680 ml  Output 1625 ml  Net -945 ml     Physical Exam: Vital Signs Blood pressure 107/77, pulse (!) 56, temperature 97.7 F (36.5 C), resp. rate 18, height 5\' 11"  (1.803 m), weight 108 kg, SpO2 97 %.   Physical Exam Constitutional: No distress . Vital signs reviewed. HEENT: EOMI, oral membranes moist Neck: supple Cardiovascular: RRR without murmur. No JVD    Respiratory: CTA Bilaterally without wheezes or rales. Normal effort    GI: BS +, non-tender, non-distended  Musculoskeletal:Normal range of motion.  Comments: RLE limbguard in place  NO TTP over L heel/arch or ball of foot on L Neurological: alert and appropriate. HOH  UE 5/5. RLE: can lift against gravity. LLE: 3/5 HF, 4/5 KE, ADF/PF. No focal sensory findings.  Skin: He isnot diaphoretic. Trace edema of LLE RLE incision intact. Minimal drainage L heel on back slightly boggy- stable-   Psychiatric: pleasant     Assessment/Plan: 1. 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  Physiatrist is providing close team supervision and 24 hour management of active medical problems listed below.  Physiatrist and rehab team continue to  assess barriers to discharge/monitor patient progress toward functional and medical goals  Care Tool:  Bathing    Body parts bathed by patient: Right arm, Left arm, Chest, Abdomen, Front perineal area, Right upper leg, Left upper leg, Face   Body parts bathed by helper: Buttocks, Left lower leg Body parts n/a: Right lower leg   Bathing assist Assist Level: Moderate Assistance - Patient 50 - 74%     Upper Body Dressing/Undressing Upper body dressing   What is the patient wearing?: Pull over shirt    Upper body assist Assist Level: Set up assist    Lower Body Dressing/Undressing Lower body dressing      What is the patient wearing?: Incontinence brief, Pants     Lower body assist Assist for lower body dressing: Moderate Assistance - Patient 50 - 74%     Toileting Toileting    Toileting assist Assist for toileting: Moderate Assistance - Patient 50 - 74%     Transfers Chair/bed transfer  Transfers assist     Chair/bed transfer assist level: Minimal Assistance - Patient > 75% Chair/bed transfer assistive device: Programmer, multimedia   Ambulation assist   Ambulation activity did not occur: Safety/medical concerns(decreased strength/activity tolerance)          Walk 10 feet activity   Assist  Walk 10 feet activity did not occur: Safety/medical concerns(decreased strength/activity tolerance)        Walk 50 feet activity   Assist Walk 50 feet with 2 turns activity did not occur: Safety/medical concerns(decreased strength/activity tolerance)  Walk 150 feet activity   Assist Walk 150 feet activity did not occur: Safety/medical concerns(decreased strength/activity tolerance)         Walk 10 feet on uneven surface  activity   Assist Walk 10 feet on uneven surfaces activity did not occur: Safety/medical concerns(decreased strength/activity tolerance)         Wheelchair     Assist Will patient use wheelchair at  discharge?: Yes Type of Wheelchair: Manual    Wheelchair assist level: Set up assist, Supervision/Verbal cueing Max wheelchair distance: 191ft    Wheelchair 50 feet with 2 turns activity    Assist        Assist Level: Set up assist, Supervision/Verbal cueing   Wheelchair 150 feet activity     Assist  CBG (last 3)  Recent Labs    09/12/18 1641 09/12/18 2108 09/13/18 0617  GLUCAP 121* 147* 74   Wheelchair 150 feet activity did not occur: Safety/medical concerns(decreased strength/activity tolerance)   Assist Level: Set up assist, Supervision/Verbal cueing   Blood pressure 107/77, pulse (!) 56, temperature 97.7 F (36.5 C), resp. rate 18, height 5\' 11"  (1.803 m), weight 108 kg, SpO2 97 %.  Medical Problem List and Plan: 1.Decreased functional mobilitysecondary to right transtibial amputation 08/28/2018 as well as recent right lower extremity revascularization procedure. --Continue CIR therapies including PT, OT  -continue limb guard   8/26- wound VAC removed    8/27- changed dressing to black "sock"- no dressing since no drainage. 2. Antithrombotics: -DVT/anticoagulation:Lovenox -antiplatelet therapy: Aspirin 81 mg daily, Plavix 75 mg daily 3. Pain Management:Robaxin andoxycodoneas needed.  8/25- pt denies pain, but didn't say if b/c took meds.  8/26- refusing pain meds  8/28- encouraged pt to take tylenol at least for L heel pain  8/30--denies pain today 4. Mood:Provide emotional support -antipsychotic agents: N/A 5. Neuropsych: This patientiscapable of making decisions on hisown behalf. 6. Skin/Wound Care:continue prevena vac per ortho. He is POD #10---dc soon?  8/26- vac off 7. Fluids/Electrolytes/Nutrition:Routine in and outs with follow-up chemistrieson admit.  8/25 Na slightly better from 130 to 132.  8/27- Na 131- stable 8.ABLA.Follow-up CBC  8/25- Drop from 9.4 to 8.9- will recheck  Thursday.  8/27- Hb 9.0 9. Hypertension. Cardura 2 mg nightly. Monitor with increased mobility  8/25- BP spike this AM to 160s/66- will monitor  8/30- BP well controlled  10. Diabetes mellitus, hemoglobin A1c 7.0. SSI. Check blood sugars before meals and at bedtime. Lantus insulin 10 units daily. Patient on Glucotrol 5 mg twice daily prior to admission.  -Resume glucotrol as needed -fair control at present.  8/25- [id number]- fairly well controlled  8/30-- reasonably controlled 11. BPH. Proscar 5 mg daily,Flomax 0.4 mg twice daily. Check PVR 12. Hypothyroidism. Synthroid 13. Constipation. Laxative assistance  bm 8/28 14.Hyperlipidemia. Lipitor  15. L heel pain  8/28- will have pt wear shoe, not sock to give more support and do heel protection if needed- con't to monitor   16 Dispo-   8/26- team conference today- therapy wants limb guard off prn- since wound VAC is off, no need.  -set d/c date as 9/8 based on min-max assist with therapies  8/27- IPOC done LOS: 6 days A FACE TO [place] 09/13/2018, 10:07 AM

## 2018-09-13 NOTE — Plan of Care (Signed)
  Problem: RH SKIN INTEGRITY Goal: RH STG SKIN FREE OF INFECTION/BREAKDOWN Description: Patient will not display any s/sx of infection.  Outcome: Progressing Goal: RH STG MAINTAIN SKIN INTEGRITY WITH ASSISTANCE Description: STG Maintain Skin Integrity With Minimal Assistance. Outcome: Progressing Goal: RH STG ABLE TO PERFORM INCISION/WOUND CARE W/ASSISTANCE Description: STG Able To Perform Incision/Wound Care With minimal Assistance. Outcome: Progressing   Problem: RH SAFETY Goal: RH STG ADHERE TO SAFETY PRECAUTIONS W/ASSISTANCE/DEVICE Description: STG Adhere to Safety Precautions With minimal Assistance/Device. Outcome: Progressing Goal: RH STG DECREASED RISK OF FALL WITH ASSISTANCE Description: STG Decreased Risk of Fall With minimal Assistance. Outcome: Progressing   Problem: RH PAIN MANAGEMENT Goal: RH STG PAIN MANAGED AT OR BELOW PT'S PAIN GOAL Description: Pain will be maintained at or below 4/10.  Outcome: Progressing

## 2018-09-13 NOTE — Progress Notes (Signed)
Physical Therapy Session Note  Patient Details  Name: [name] MRN: [id number] Date of Birth: 07-25-1927  Today's Date: 09/13/2018 PT Individual Time: 0801-0900 PT Individual Time Calculation (min): 59 min   Short Term Goals: Week 1:  PT Short Term Goal 1 (Week 1): Patient will perform bed mobility with supervision. PT Short Term Goal 2 (Week 1): Patient will perform basic transfers with min A. PT Short Term Goal 3 (Week 1): Patient will perform dynamic standing balance activities with min A. PT Short Term Goal 4 (Week 1): Patient will initiate ambulation. PT Short Term Goal 5 (Week 1): Patient will propel w/c 75 feet without a rest break.  Skilled Therapeutic Interventions/Progress Updates:   Pt received in bed finished with breakfast stating that he needed to have a BM.  Pt performed transfer to EOB with bed rails, flat bed with supervision-min guard for balance.  Assisted pt with donning L shoe to decrease pain/pressure through L heel.  Performed multiple squat pivot transfers without the use of slideboard bed > w/c and w/c <.> toilet with therapist verbalizing sequence for set up, hand placement and safe technique with use of head-hips relationship.  Pt required mod A from therapist to maintain anterior lean, L foot placement in order to fully lift hips and clear surface transferring to.  Performed both to left and R.  Pt able to perform hygiene on toilet with PT providing supervision for balance and verbal cues to lift RLE to assist with reaching for hygiene. Pt performed sustained squat to allow therapist to assist with pulling up brief and shorts.    Performed w/c mobility training on ramp with PT verbalizing safe technique for ascending and descending ramp for home entry/exit and then providing min A behind w/c while pt return demonstrated x 3 reps.    In // bars performed 2 sets of sit <> stand with mod A and maintaining standing on LLE while performing 10 reps each RLE hip ABD and  hip flexion.  Second set practiced shoulder depression and LE extension to lift body in preparation for hopping.  Pt unable to clear foot fully from floor.    Returned to room and pt left in w/c with belt alarm and all items within reach.     Therapy Documentation Precautions:  Precautions Precaution Comments: pt with R BKA and splint/shrinker from [employer] Other Brace: splint and shrinker from [employer] Restrictions Weight Bearing Restrictions: Yes RLE Weight Bearing: Non weight bearing General:   Vital Signs: Therapy Vitals Temp: 97.6 F (36.4 C) Temp Source: Oral Pulse Rate: (!) 57 Resp: 18 BP: (!) 127/53 Patient Position (if appropriate): Sitting Oxygen Therapy SpO2: 100 % O2 Device: Room Air Pain: Pain Assessment Pain Scale: 0-10 Pain Score: 0-No pain Pain Type: Acute pain Pain Location: Leg Pain Orientation: Right Pain Descriptors / Indicators: Aching Pain Onset: Gradual Pain Intervention(s): Medication (See eMAR)    Therapy/Group: Individual Therapy   Rico Junker, PT, DPT 09/13/18    2:48 PM     09/13/2018, 2:48 PM

## 2018-09-13 NOTE — Progress Notes (Signed)
Physical Therapy Session Note  Patient Details  Name: BONNY EGGER MRN: [id number] Date of Birth: 07/26/1927  Today's Date: 09/13/2018 PT Individual Time: 2355-7322 PT Individual Time Calculation (min): 56 min   Short Term Goals: Week 1:  PT Short Term Goal 1 (Week 1): Patient will perform bed mobility with supervision. PT Short Term Goal 2 (Week 1): Patient will perform basic transfers with min A. PT Short Term Goal 3 (Week 1): Patient will perform dynamic standing balance activities with min A. PT Short Term Goal 4 (Week 1): Patient will initiate ambulation. PT Short Term Goal 5 (Week 1): Patient will propel w/c 75 feet without a rest break.  Skilled Therapeutic Interventions/Progress Updates:    Pt seated in w/c upon PT arrival, agreeable to therapy tx and denies pain. Pt transported to the gym in the w/c. Pt performed slideboard transfer to the mat with min assist, cues for techniques. Pt transferred to supine with supervision. Pt performed LE therex this session for strengthening while in supine, 2 x 10 of each with cues for techniques: SLR, hip flexion, hip abduction, and bridges with bolster under R thigh. Pt transferred back to sitting with min assist. Pt performed x 3 sit<>stands throughout this session with RW and min-mod assist from elevated mat (24 inches). While in standing pt worked on the following tasks: dynamic balance while tossing horseshoes, standing hip abduction x 10 with R LE and standing hip extension x 10 with R LE. Pt performed slideboard transfer back to w/c with min assist and propelled w/c back to room x 200 ft with increased time and with supervision/set up assist, using BUEs. Pt performed slideboard transfer to the recliner with min assist and left with needs in reach and family member present.   Therapy Documentation Precautions:  Precautions Precaution Comments: pt with R BKA and splint/shrinker from [employer] Other Brace: splint and shrinker from  [employer] Restrictions Weight Bearing Restrictions: Yes RLE Weight Bearing: Non weight bearing    Therapy/Group: Individual Therapy  Netta Corrigan, PT, DPT 09/13/2018, 12:09 PM

## 2018-09-13 NOTE — Progress Notes (Signed)
Patient c/o pain to the left heel area. He had a small foam over the area & grimaced when foam removed. Patient's pressure injury to the left heel now looks like a stage 2 pressure injury. It looks like a burst blister, with skin still attached in the center with discoloration & surrounding redness. Area was cleansed with normal saline & a new foam dressing was applied. Heel was floated. Communication put in for the physician. Will continue to monitor.

## 2018-09-14 ENCOUNTER — Inpatient Hospital Stay (HOSPITAL_COMMUNITY): Payer: Medicare Other

## 2018-09-14 ENCOUNTER — Inpatient Hospital Stay (HOSPITAL_COMMUNITY): Payer: Medicare Other | Admitting: Occupational Therapy

## 2018-09-14 LAB — GLUCOSE, CAPILLARY
Glucose-Capillary: 93 mg/dL (ref 70–99)
Glucose-Capillary: 103 mg/dL — ABNORMAL HIGH (ref 70–99)
Glucose-Capillary: 152 mg/dL — ABNORMAL HIGH (ref 70–99)
Glucose-Capillary: 138 mg/dL — ABNORMAL HIGH (ref 70–99)

## 2018-09-14 LAB — CREATININE, SERUM
Creatinine, Ser: 1.06 mg/dL (ref 0.61–1.24)
GFR calc Af Amer: >60 mL/min (ref >60)
GFR calc non Af Amer: >60 mL/min (ref >60)

## 2018-09-14 NOTE — Plan of Care (Signed)
  Problem: RH Ambulation Goal: LTG Patient will ambulate in controlled environment (PT) Description: LTG: Patient will ambulate in a controlled environment, # of feet with assistance (PT). Outcome: Not Progressing Flowsheets (Taken 09/14/2018 1106) LTG: Pt will ambulate in controlled environ  assist needed:: (Goal discontinued) -- Note: Goal discontinued due to lack of progress, patient will be at w/c level at discharge.  Goal: LTG Patient will ambulate in home environment (PT) Description: LTG: Patient will ambulate in home environment, # of feet with assistance (PT). Outcome: Not Progressing Flowsheets (Taken 09/14/2018 1106) LTG: Pt will ambulate in home environ  assist needed:: (discontinued goal) -- Note: Goal discontinued due to lack of progress, patient will be at w/c level at discharge.

## 2018-09-14 NOTE — Progress Notes (Signed)
[facility] PHYSICAL MEDICINE & REHABILITATION PROGRESS NOTE   Subjective/Complaints:  L heel now has a burst blister and appears stage II per nursing note- covered with foam dressing.  Will order floating heel and prevalon boot when in bed.  ROS: Patient denies fever, rash, sore throat, blurred vision, nausea, vomiting, diarrhea, cough, shortness of breath or chest pain,   headache, or mood change.    Objective:   No results found. No results for input(s): WBC, HGB, HCT, PLT in the last 72 hours. Recent Labs    09/14/18 0514  CREATININE 1.06    Intake/Output Summary (Last 24 hours) at 09/14/2018 0904 Last data filed at 09/14/2018 0721 Gross per 24 hour  Intake 800 ml  Output 1475 ml  Net -675 ml     Physical Exam: Vital Signs Blood pressure (!) 136/59, pulse (!) 55, temperature 97.7 F (36.5 C), temperature source Oral, resp. rate 17, height 5\' 11"  (1.803 m), weight 107 kg, SpO2 97 %.   Physical Exam Constitutional: No distress . Vital signs reviewed. HEENT: EOMI, oral membranes moist Neck: supple Cardiovascular: RRR without murmur. No JVD    Respiratory: CTA Bilaterally without wheezes or rales. Normal effort    GI: BS +, non-tender, non-distended  Musculoskeletal:Normal range of motion.  Comments: RLE limbguard in place  NO TTP over L heel/arch or ball of foot on L Neurological: alert and appropriate. HOH  UE 5/5. RLE: can lift against gravity. LLE: 3/5 HF, 4/5 KE, ADF/PF. No focal sensory findings.  Skin: He isnot diaphoretic. Trace edema of LLE RLE incision intact. Minimal drainage L heel on back has foam dressing- per nursing note, burst blister/open pink skin  Psychiatric: pleasant     Assessment/Plan: 1. 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  Physiatrist is providing  close team supervision and 24 hour management of active medical problems listed below.  Physiatrist and rehab team continue to assess barriers to discharge/monitor patient progress toward functional and medical goals  Care Tool:  Bathing    Body parts bathed by patient: Right arm, Left arm, Chest, Abdomen, Front perineal area, Right upper leg, Left upper leg, Face   Body parts bathed by helper: Buttocks, Left lower leg Body parts n/a: Right lower leg   Bathing assist Assist Level: Moderate Assistance - Patient 50 - 74%     Upper Body Dressing/Undressing Upper body dressing   What is the patient wearing?: Pull over shirt    Upper body assist Assist Level: Set up assist    Lower Body Dressing/Undressing Lower body dressing      What is the patient wearing?: Incontinence brief, Pants     Lower body assist Assist for lower body dressing: Moderate Assistance - Patient 50 - 74%     Toileting Toileting    Toileting assist Assist for toileting: Moderate Assistance - Patient 50 - 74%     Transfers Chair/bed transfer  Transfers assist     Chair/bed transfer assist level: Minimal Assistance - Patient > 75% Chair/bed transfer assistive device: Sliding board   Locomotion Ambulation   Ambulation assist   Ambulation activity did not occur: Safety/medical concerns(decreased strength/activity tolerance)          Walk 10 feet activity   Assist  Walk 10 feet activity did not occur: Safety/medical concerns(decreased strength/activity tolerance)  Walk 50 feet activity   Assist Walk 50 feet with 2 turns activity did not occur: Safety/medical concerns(decreased strength/activity tolerance)         Walk 150 feet activity   Assist Walk 150 feet activity did not occur: Safety/medical concerns(decreased strength/activity tolerance)         Walk 10 feet on uneven surface  activity   Assist Walk 10 feet on uneven surfaces activity did not occur:  Safety/medical concerns(decreased strength/activity tolerance)         Wheelchair     Assist Will patient use wheelchair at discharge?: Yes Type of Wheelchair: Manual    Wheelchair assist level: Supervision/Verbal cueing Max wheelchair distance: 50    Wheelchair 50 feet with 2 turns activity    Assist        Assist Level: Supervision/Verbal cueing, Set up assist   Wheelchair 150 feet activity     Assist  CBG (last 3)  Recent Labs    09/13/18 1625 09/13/18 2104 09/14/18 0616  GLUCAP 167* 131* 93   Wheelchair 150 feet activity did not occur: Safety/medical concerns(decreased strength/activity tolerance)   Assist Level: Set up assist, Supervision/Verbal cueing   Blood pressure (!) 136/59, pulse (!) 55, temperature 97.7 F (36.5 C), temperature source Oral, resp. rate 17, height 5\' 11"  (1.803 m), weight 107 kg, SpO2 97 %.  Medical Problem List and Plan: 1.Decreased functional mobilitysecondary to right transtibial amputation 08/28/2018 as well as recent right lower extremity revascularization procedure. --Continue CIR therapies including PT, OT  -continue limb guard   8/26- wound VAC removed    8/27- changed dressing to black "sock"- no dressing since no drainage. 2. Antithrombotics: -DVT/anticoagulation:Lovenox -antiplatelet therapy: Aspirin 81 mg daily, Plavix 75 mg daily 3. Pain Management:Robaxin andoxycodoneas needed.  8/25- pt denies pain, but didn't say if b/c took meds.  8/26- refusing pain meds  8/28- encouraged pt to take tylenol at least for L heel pain  8/30--denies pain today 4. Mood:Provide emotional support -antipsychotic agents: N/A 5. Neuropsych: This patientiscapable of making decisions on hisown behalf. 6. Skin/Wound Care:continue prevena vac per ortho. He is POD #10---dc soon?  8/26- vac off 7. Fluids/Electrolytes/Nutrition:Routine in and outs with follow-up  chemistrieson admit.  8/25 Na slightly better from 130 to 132.  8/27- Na 131- stable 8.ABLA.Follow-up CBC  8/25- Drop from 9.4 to 8.9- will recheck Thursday.  8/27- Hb 9.0 9. Hypertension. Cardura 2 mg nightly. Monitor with increased mobility  8/25- BP spike this AM to 160s/66- will monitor  8/30- BP well controlled  10. Diabetes mellitus, hemoglobin A1c 7.0. SSI. Check blood sugars before meals and at bedtime. Lantus insulin 10 units daily. Patient on Glucotrol 5 mg twice daily prior to admission.  -Resume glucotrol as needed -fair control at present.  8/25- 546-270- fairly well controlled  8/30-- reasonably controlled 11. BPH. Proscar 5 mg daily,Flomax 0.4 mg twice daily. Check PVR 12. Hypothyroidism. Synthroid 13. Constipation. Laxative assistance  bm 8/28 14.Hyperlipidemia. Lipitor  15. L heel pain/blister which is open  8/28- will have pt wear shoe, not sock to give more support and do heel protection if needed- con't to monitor   8/31- will order floating L heel and prevalon boot when in bed to treat blister 16 Dispo-   8/26- team conference today- therapy wants limb guard off prn- since wound VAC is off, no need.  -set d/c date as 9/8 based on min-max assist with therapies  8/27- IPOC done LOS: 7 days A FACE TO FACE EVALUATION  WAS PERFORMED  Dione Mccombie 09/14/2018, 9:04 AM

## 2018-09-14 NOTE — Progress Notes (Signed)
Physical Therapy Session Note  Patient Details  Name: [name] MRN: [id number] Date of Birth: Jun 10, 1927  Today's Date: 09/14/2018 PT Individual Time: 1505-1605 PT Individual Time Calculation (min): 60 min   Short Term Goals: Week 2: = LTGs due to ELOS   Skilled Therapeutic Interventions/Progress Updates:  Pt resting in w/c.  W/c propulsion using bil UEs x 150' x 2 on level tile with supervision. Min asisst and cues for brakes, mod assist for leg rest mgt.  Simulated car transfer to 20" high seat using slide board, slightly downhill from w/c, min/mod assist. Mod assist to exit.  Mod cues to avoid sacral sitting and sliding forward on board.  PT wrapped w/c brakes in bright coband.    With hand- out and mod cues, pt performed 10 x 1 each w/c push ups, r long arc quad knee extensions; with cues and demo, 10 x 2 trunk flexion/extension, 15 x 1 R quad sets.  At end of session, pt resting in w/c with needs at hand and seat belt alarm set.     Therapy Documentation Precautions:  Precautions Precaution Comments: pt with R BKA and splint/shrinker from [employer], also limb protector Other Brace: splint and shrinker from [employer] Restrictions Weight Bearing Restrictions: Yes RLE Weight Bearing: Non weight bearing  Pain: pt denied         Therapy/Group: Individual Therapy  Caetano Oberhaus 09/14/2018, 4:10 PM

## 2018-09-14 NOTE — Progress Notes (Signed)
Occupational Therapy Session Note  Patient Details  Name: [name] MRN: [id number] Date of Birth: [birth year]  Today's Date: 09/14/2018 OT Individual Time: 7408-1448 OT Individual Time Calculation (min): 73 min    Short Term Goals: Week 1:  OT Short Term Goal 1 (Week 1): Pt will be able to complete a squat pivot to and from [facility] or toilet with mod A of 1. OT Short Term Goal 2 (Week 1): Pt will be able to pull pants down to prepare for toileting with min A. OT Short Term Goal 3 (Week 1): Pt will be able to pull pants up after toileting with mod A. OT Short Term Goal 4 (Week 1): Pt will be able to don pants over legs with min A. OT Short Term Goal 5 (Week 1): Pt will be able to sit to stand with min A.  Skilled Therapeutic Interventions/Progress Updates:    Treatment session with focus on functional transfers, sit > stand, and LB self-care tasks.  Pt received supine in bed reporting pain in LLE, particularly at his heel.  Completed bed mobility min guard when coming to sit to EOB.  Utilized slide board for transfer bed > w/c with min assist and multimodal cues for anterior weight shift.  Engaged in self-care tasks at sink with focus on sit > stand.  Pt unable to come up to full upright standing this session, therefore utilized "push up" from w/c and therapist assisting with washing buttocks and pull pants over hips while pt sustained pushup position from arm rests.  Focus on LB dressing, pt with decreased ability to sustain forward reach to thread LLE, will continue to benefit from practice and maybe use of reacher.  Engaged in slide board transfers w/c <> therapy mat with improved sequencing and weight shift.  Focus on BUE strengthening with use of 3# dowel rod with chest presses, overhead presses, and lateral rows.  Returned to room and transferred back to recliner with slide board with setup assist for placement of board and min assist during transfer.  Pt left with seat belt alarm on and all  needs in reach.  Therapy Documentation Precautions:  Precautions Precaution Comments: pt with R BKA and splint/shrinker from [employer] Other Brace: splint and shrinker from [employer] Restrictions Weight Bearing Restrictions: Yes RLE Weight Bearing: Non weight bearing General:   Vital Signs: Therapy Vitals Temp: 97.7 F (36.5 C) Temp Source: Oral Pulse Rate: (!) 55 Resp: 17 BP: (!) 136/59 Patient Position (if appropriate): Lying Oxygen Therapy SpO2: 97 % O2 Device: Room Air Pain: Pain Assessment Pain Scale: 0-10 Pain Score: 2  Pain Type: Acute pain Pain Location: Heel Pain Orientation: Left Pain Descriptors / Indicators: Discomfort Pain Frequency: Intermittent Pain Onset: On-going Pain Intervention(s): Repositioned   Therapy/Group: Individual Therapy  Simonne Come 09/14/2018, 8:56 AM

## 2018-09-14 NOTE — Plan of Care (Signed)
  Problem: RH SKIN INTEGRITY Goal: RH STG SKIN FREE OF INFECTION/BREAKDOWN Description: Patient will not display any s/sx of infection.  Outcome: Progressing Goal: RH STG MAINTAIN SKIN INTEGRITY WITH ASSISTANCE Description: STG Maintain Skin Integrity With Minimal Assistance. Outcome: Progressing Goal: RH STG ABLE TO PERFORM INCISION/WOUND CARE W/ASSISTANCE Description: STG Able To Perform Incision/Wound Care With minimal Assistance. Outcome: Progressing   Problem: RH SAFETY Goal: RH STG ADHERE TO SAFETY PRECAUTIONS W/ASSISTANCE/DEVICE Description: STG Adhere to Safety Precautions With minimal Assistance/Device. Outcome: Progressing Goal: RH STG DECREASED RISK OF FALL WITH ASSISTANCE Description: STG Decreased Risk of Fall With minimal Assistance. Outcome: Progressing   Problem: RH PAIN MANAGEMENT Goal: RH STG PAIN MANAGED AT OR BELOW PT'S PAIN GOAL Description: Pain will be maintained at or below 4/10.  Outcome: Progressing

## 2018-09-14 NOTE — Progress Notes (Signed)
Physical Therapy Weekly Progress Note  Patient Details  Name: [name] MRN: [id number] Date of Birth: July 31, 1927  Beginning of progress report period: September 08, 2018 End of progress report period: September 14, 2018  Today's Date: 09/14/2018 PT Individual Time: 1015-1130 PT Individual Time Calculation (min): 75 min    Patient has met 2 of 5 short term goals.  He currently requires supervision with bed mobility, min A-CGA for level slide board transfers, mod-min A for sit<>stand and stand pivot transfers with a RW, and has been unable to progress towards initiating gait, limited by generalized weakness in B UEs and L LE and pain on L heel from a blister.  Patient continues to demonstrate the following deficits muscle weakness and muscle joint tightness, decreased cardiorespiratoy endurance and decreased sitting balance, decreased standing balance, decreased postural control, decreased balance strategies and difficulty maintaining precautions and therefore will continue to benefit from skilled PT intervention to increase functional independence with mobility.  Patient progressing toward long term goals..  Plan of care revisions: discontinued ambulation goals due to lack of progress..  PT Short Term Goals Week 1:  PT Short Term Goal 1 (Week 1): Patient will perform bed mobility with supervision. PT Short Term Goal 1 - Progress (Week 1): Met PT Short Term Goal 2 (Week 1): Patient will perform basic transfers with min A. PT Short Term Goal 2 - Progress (Week 1): Progressing toward goal PT Short Term Goal 3 (Week 1): Patient will perform dynamic standing balance activities with min A. PT Short Term Goal 3 - Progress (Week 1): Progressing toward goal PT Short Term Goal 4 (Week 1): Patient will initiate ambulation. PT Short Term Goal 4 - Progress (Week 1): Not progressing PT Short Term Goal 5 (Week 1): Patient will propel w/c 75 feet without a rest break. PT Short Term Goal 5 - Progress (Week  1): Met Week 2:  PT Short Term Goal 1 (Week 2): STG=LTG due to ELOS.  Skilled Therapeutic Interventions/Progress Updates:     Patient in recliner upon PT arrival. Patient alert and agreeable to PT session. Denied pain throughout session. R tennis shoe, limb guard and shrinker donned prior to session.  Therapeutic Activity: Bed Mobility: Patient performed supine to/from sit and rolling to/from prone with supervision on a mat table. Provided verbal cues for reaching with opposite arm and leg to roll. Transfers: Patient a lateral scoot transfer x1 with min A and SBT x2 with CGA and max A for set up with board. Provided verbal cues for head-hips relationship, hand placement, and sequencing of set up for SBTs.  Wheelchair Mobility:  Patient propelled wheelchair 150 feet x2 with supervision and set up assist. Provided verbal cues for donning leg rest, however patient had difficulty following cues due to being [facility].   Therapeutic Exercise: Patient performed the following exercises, provided as HEP and placed in CIR notebook, with verbal and tactile cues for proper technique. Doffed limb guard during exercises and donned again after with total A with education for proper technique.   Exercises  B Supine Quad Set (BKA) - 10 reps - 1 sets - 1-2x daily - 7x weekly  B Supine Gluteal Sets - 10 reps - 1 sets - 1-2x daily - 7x weekly  B Supine Active Straight Leg Raise - 10 reps - 1 sets - 1-2x daily - 7x weekly Required active assist. B Supine Isometric Hip Adduction with Towel Roll (AKA) - 10 reps - 1 sets - 1-2x daily - 7x  weekly  R Supine Single Leg Bridge with Sound Leg (BKA) - 10 reps - 1 sets - 1-2x daily - 7x weekly Required active assist. L Sidelying Hip Abduction (AKA) - 10 reps - 1 sets - 1-2x daily - 7x weekly Required active assist. B Seated Knee Extension (BKA) - 10 reps - 3 sets - 1-2x daily - 7x weekly  Chair Push-Up (AKA) - 5 reps - 2 sets - 1-2x daily - 7x weekly  Attempted exercises in  prone, however, patient was unable to tolerate prone lying at this time  Patient in w/c at end of session with breaks locked, seat belt alarm set, and all needs within reach. Educated patient on POC and discussed planning for discharging at w/c level due to lack of progress with ambulation and development of blister on his heel. Patient in agreement and stated understanding.    Therapy Documentation Precautions:  Precautions Precaution Comments: pt with R BKA and splint/shrinker from [employer] Other Brace: splint and shrinker from [employer] Restrictions Weight Bearing Restrictions: Yes RLE Weight Bearing: Non weight bearing   Therapy/Group: Individual Therapy  Corrissa Martello L Haden Cavenaugh PT, DPT  09/14/2018, 12:55 PM

## 2018-09-15 ENCOUNTER — Inpatient Hospital Stay (HOSPITAL_COMMUNITY): Payer: Medicare Other | Admitting: Physical Therapy

## 2018-09-15 ENCOUNTER — Inpatient Hospital Stay (HOSPITAL_COMMUNITY): Payer: Medicare Other

## 2018-09-15 ENCOUNTER — Ambulatory Visit (HOSPITAL_COMMUNITY): Payer: Medicare Other | Admitting: *Deleted

## 2018-09-15 LAB — GLUCOSE, CAPILLARY
Glucose-Capillary: 86 mg/dL (ref 70–99)
Glucose-Capillary: 116 mg/dL — ABNORMAL HIGH (ref 70–99)
Glucose-Capillary: 184 mg/dL — ABNORMAL HIGH (ref 70–99)
Glucose-Capillary: 128 mg/dL — ABNORMAL HIGH (ref 70–99)

## 2018-09-15 NOTE — Progress Notes (Signed)
Physical Therapy Session Note  Patient Details  Name: [name] MRN: [id number] Date of Birth: May 31, 1927  Today's Date: 09/15/2018 PT Individual Time: 1300-1400 PT Individual Time Calculation (min): 60 min   Short Term Goals: Week 2:  PT Short Term Goal 1 (Week 2): STG=LTG due to ELOS.  Skilled Therapeutic Interventions/Progress Updates:    Pt received seated in w/c in room, agreeable to PT session. No complaints of pain. Pt's son present during session and agreeable to participate in hands-on training. Pt is Supervision for w/c mobility x 150 ft with use of BUE. Slide board transfer w/c to mat table with CGA. Reviewed management of w/c parts, safe setup of transfer, and how to assist pt with transfer. Pt's son demos good return demo of transfer with min cueing for safe slide board placement and body mechanics when assisting pt with transfer. Reviewed how to don/doff shrinker sock and residual limb guard. Applied lotion to proximal residual limb due to dry skin. Reviewed HEP handout of seated exercises: LAQ and w/c pushups x 10 reps each. Pt left seated in w/c in room with needs in reach, son present at end of session.  Therapy Documentation Precautions:  Precautions Precaution Comments: pt with R BKA and splint/shrinker from [employer] Other Brace: splint and shrinker from [employer] Restrictions Weight Bearing Restrictions: Yes RLE Weight Bearing: Non weight bearing    Therapy/Group: Individual Therapy   Excell Seltzer, PT, DPT  09/15/2018, 3:46 PM

## 2018-09-15 NOTE — Progress Notes (Signed)
Recreational Therapy Session Note  Patient Details  Name: [name] MRN: [id number] Date of Birth: [birth year] Today's Date: 09/15/2018 Time:  [phone] Pain: no c/o Skilled Therapeutic Interventions/Progress Updates: Session focused on activity tolerance, sit > stands with RW, standing tolerance/dynamic balance & w/c mobility during co-treat with OT.  Pt propelled w/c to gym using BUE's with supervision.  Once in the gym, pt performed sit > stands for horseshoe activity reaching outside BOS with min assist.  Frequent rest breaks needed due to fatigue.  Therapy/Group: Co-Treatment Jamari Moten 09/15/2018, 2:42 PM

## 2018-09-15 NOTE — Progress Notes (Addendum)
Occupational Therapy Session Note  Patient Details  Name: [name] MRN: [id number] Date of Birth: 05-13-1927  Today's Date: 09/15/2018 OT Individual Time: 0930-1040 OT Individual Time Calculation (min): 70 min    Short Term Goals: Week 2:  OT Short Term Goal 1 (Week 2): Pt will be able to sit to stand with min A. OT Short Term Goal 2 (Week 2): Pt will be able to don pants over legs with min A. OT Short Term Goal 3 (Week 2): Pt will be able to pull pants up after toileting with mod A. OT Short Term Goal 4 (Week 2): Pt will complete squat pivot transfer to drop arm BSC with min A OT Short Term Goal 5 (Week 2): Pt will complete bathing tasks with min A  Skilled Therapeutic Interventions/Progress Updates:    Co-tx with Recreation Therapy for 45 mins.  OT intervention with focus on donning sock/shoe on LLE, sit<>stand from w/c, standing balance while engaged in activity, w/c mobility, discharge planning, and BUE therex to increase independence and prepare for discharge home with assistance/supervisoin. Pt propelled w/c to gym and initially engaged in standing task reaching for horseshoe with LUE and tossing at stake. Pt initially required min A for sit<>stand fading to CGA.  Pt required min A/CGA for standing balance during task.  Pt required min verbal cues for sequencing sit<>stand. Pt practiced donning sock and shoe on L foot. Pt issued sock aide to don sock.  Pt states he has never been able to cross his legs to don/doff socks and soes. Discussed home arrangement and discharge plans.  Home Measurement Sheet given to pt for use by family to assist with discharge planning and equipment needs.  Bathroom is not w/c accessible. Pt engaged in w/c mobility in obstacle course with supervisoin.  Pt completed 8 mins on Sci[employer] level 5 before returning to room.  Pt remained in w/c with belt alarm activated and all needs within reach.   Therapy Documentation Precautions:  Precautions Precaution  Comments: pt with R BKA and splint/shrinker from [employer] Other Brace: splint and shrinker from [employer] Restrictions Weight Bearing Restrictions: Yes RLE Weight Bearing: Non weight bearing  Pain: Pain Assessment Pain Scale: 0-10 Pain Score: 0-No pain   Therapy/Group: Individual Therapy  [name] 09/15/2018, 11:14 AM

## 2018-09-15 NOTE — Progress Notes (Signed)
Occupational Therapy Weekly Progress Note  Patient Details  Name: [name] MRN: [id number] Date of Birth: 12/25/1927  Beginning of progress report period: September 08, 2018 End of progress report period: September 15, 2018  Patient has met 2 of 5 short term goals.  Pt is making slow and inconsistent progress with BADLs and functional transfers.  Pt requires mod verbal cues for sequencing with sit<>stand and squat pivot transfers. Sit<>stand fluctuates between max A and min A.  Pt is able to complete squat pivot transfers with mod A and mod/max verbal cues for sequencing. Pt requires mod/max A for LB dressing tasks. Pt requires max A for toileting tasks. Pt's family has not been present for therapy sessions.   Patient continues to demonstrate the following deficits: muscle weakness, decreased cardiorespiratoy endurance and decreased sitting balance, decreased standing balance, decreased postural control and decreased balance strategies and therefore will continue to benefit from skilled OT intervention to enhance overall performance with BADL and Reduce care partner burden.  Patient progressing toward long term goals..  Continue plan of care.  OT Short Term Goals Week 1:  OT Short Term Goal 1 (Week 1): Pt will be able to complete a squat pivot to and from BSC or toilet with mod A of 1. OT Short Term Goal 1 - Progress (Week 1): Met OT Short Term Goal 2 (Week 1): Pt will be able to pull pants down to prepare for toileting with min A. OT Short Term Goal 2 - Progress (Week 1): Met OT Short Term Goal 3 (Week 1): Pt will be able to pull pants up after toileting with mod A. OT Short Term Goal 3 - Progress (Week 1): Progressing toward goal OT Short Term Goal 4 (Week 1): Pt will be able to don pants over legs with min A. OT Short Term Goal 4 - Progress (Week 1): Progressing toward goal OT Short Term Goal 5 (Week 1): Pt will be able to sit to stand with min A. OT Short Term Goal 5 - Progress (Week  1): Progressing toward goal Week 2:  OT Short Term Goal 1 (Week 2): Pt will be able to sit to stand with min A. OT Short Term Goal 2 (Week 2): Pt will be able to don pants over legs with min A. OT Short Term Goal 3 (Week 2): Pt will be able to pull pants up after toileting with mod A. OT Short Term Goal 4 (Week 2): Pt will complete squat pivot transfer to drop arm BSC with min A OT Short Term Goal 5 (Week 2): Pt will complete bathing tasks with min A   [name], [name] 09/15/2018, 8:19 AM  

## 2018-09-15 NOTE — Progress Notes (Signed)
Occupational Therapy Session Note  Patient Details  Name: [name] MRN: [id number] Date of Birth: 06/01/1927  Today's Date: 09/15/2018 OT Individual Time: 3734-2876 OT Individual Time Calculation (min): 71 min    Short Term Goals: Week 1:  OT Short Term Goal 1 (Week 1): Pt will be able to complete a squat pivot to and from [facility] or toilet with mod A of 1. OT Short Term Goal 2 (Week 1): Pt will be able to pull pants down to prepare for toileting with min A. OT Short Term Goal 3 (Week 1): Pt will be able to pull pants up after toileting with mod A. OT Short Term Goal 4 (Week 1): Pt will be able to don pants over legs with min A. OT Short Term Goal 5 (Week 1): Pt will be able to sit to stand with min A.  Skilled Therapeutic Interventions/Progress Updates:    Pt resting in bed upon arrival and ready to get OOB.  OT intervention with focus on bed mobility, sitting balance, sit<>stand, standing balance, functional transfers, BADL training, activity tolerance, and safety awareness to increase independence with BADLs.  Supine>sit EOB-supervision with HOB elevated and use of bed rails Sitting balance EOB-supervisoin Squat pivot transfer to w/c-mod A Sit<>stand from w/c at sink-min A/mod A with mod verbal cues for sequencing Standing balance-min A Bathing with sit<>stand-mod A LB dressing-mod A Toileting on BSC-max A Pt initially required mod A for sit<>stand, fading to min A with repetition. Pt fatigues and requires multiple rest breaks.  Pt requested use of toilet and performed squat pivot transfer to drop arm BSC with mod A and mod verbal cues for sequencing.  Pt able to perform lateral leans for hygiene. Pt transferred back to w/c and remained in w/c with belt alarm activated and all needs within reach.   Therapy Documentation Precautions:  Precautions Precaution Comments: pt with R BKA and splint/shrinker from [employer] Other Brace: splint and shrinker from [employer] Restrictions Weight  Bearing Restrictions: Yes RLE Weight Bearing: Non weight bearing   Pain:  "it's ok but (L) heel still hurts a little"; MD aware and repositioned   Therapy/Group: Individual Therapy  [name] 09/15/2018, 8:13 AM

## 2018-09-15 NOTE — Progress Notes (Signed)
[place] PHYSICAL MEDICINE & REHABILITATION PROGRESS NOTE   Subjective/Complaints:  Pt reports no change in pain of L heel - not better- LBM last night and feels like could go again soon.  Wore prevalon boot overnight per pt and OT. Didn't help discomfort, per pt.    ROS: Patient denies fever, rash, sore throat, blurred vision, nausea, vomiting, diarrhea, cough, shortness of breath or chest pain,   headache, or mood change.    Objective:   No results found. No results for input(s): WBC, HGB, HCT, PLT in the last 72 hours. Recent Labs    09/14/18 0514  CREATININE 1.06    Intake/Output Summary (Last 24 hours) at 09/15/2018 0902 Last data filed at 09/15/2018 0815 Gross per 24 hour  Intake 540 ml  Output 1800 ml  Net -1260 ml     Physical Exam: Vital Signs Blood pressure (!) 137/58, pulse (!) 55, temperature 98.2 F (36.8 C), temperature source Oral, resp. rate 18, height 5\' 11"  (1.803 m), weight 108 kg, SpO2 96 %.   Physical Exam Constitutional: No distress- sitting up in manual w/c in room, OT at bedside; limb guard on RLE and prevalon boot OFF LLE; appears stable   Vital signs and labs reviewed. HEENT: EOMI, oral membranes moist Neck: supple Cardiovascular: RRR     Respiratory: CTA Bilaterally without wheezes or rales. A little coarse x1; cleared with cough  GI: BS +, non-tender, non-distended  Musculoskeletal:Normal range of motion.  Comments: RLE limbguard in place  NO TTP over L heel/arch or ball of foot on L; only back of heel- due to burst blister Neurological: alert and appropriate. HOH  UE 5/5. RLE: can lift against gravity. LLE: 3/5 HF, 4/5 KE, ADF/PF. No focal sensory findings.  Skin: He isnot diaphoretic. Trace edema of LLE RLE incision intact. Minimal drainage L heel on back has foam dressing- per nursing note, burst blister/open pink skin  Psychiatric: pleasant     Assessment/Plan: 1. Functional deficits secondary to R BKA secondary to dry  gangrene- has recent angioplasty but unable to save R foot- which require 3+ hours per day of interdisciplinary therapy in a comprehensive inpatient rehab setting.  Physiatrist is providing close team supervision and 24 hour management of active medical problems listed below.  Physiatrist and rehab team continue to assess barriers to discharge/monitor patient progress toward functional and medical goals  Care Tool:  Bathing    Body parts bathed by patient: Right arm, Left arm, Chest, Abdomen, Front perineal area, Right upper leg, Left upper leg   Body parts bathed by helper: Buttocks, Left lower leg Body parts n/a: Right lower leg   Bathing assist Assist Level: Moderate Assistance - Patient 50 - 74%     Upper Body Dressing/Undressing Upper body dressing   What is the patient wearing?: Pull over shirt    Upper body assist Assist Level: Set up assist    Lower Body Dressing/Undressing Lower body dressing      What is the patient wearing?: Incontinence brief, Pants     Lower body assist Assist for lower body dressing: Moderate Assistance - Patient 50 - 74%     Toileting Toileting    Toileting assist Assist for toileting: Maximal Assistance - Patient 25 - 49%     Transfers Chair/bed transfer  Transfers assist     Chair/bed transfer assist level: Minimal Assistance - Patient > 75% Chair/bed transfer assistive device: Sliding board   Locomotion Ambulation   Ambulation assist   Ambulation activity  did not occur: Safety/medical concerns(decreased strength/activity tolerance)          Walk 10 feet activity   Assist  Walk 10 feet activity did not occur: Safety/medical concerns(decreased strength/activity tolerance)        Walk 50 feet activity   Assist Walk 50 feet with 2 turns activity did not occur: Safety/medical concerns(decreased strength/activity tolerance)         Walk 150 feet activity   Assist Walk 150 feet activity did not occur:  Safety/medical concerns(decreased strength/activity tolerance)         Walk 10 feet on uneven surface  activity   Assist Walk 10 feet on uneven surfaces activity did not occur: Safety/medical concerns(decreased strength/activity tolerance)         Wheelchair     Assist Will patient use wheelchair at discharge?: Yes Type of Wheelchair: Manual    Wheelchair assist level: Supervision/Verbal cueing Max wheelchair distance: 150    Wheelchair 50 feet with 2 turns activity    Assist        Assist Level: Supervision/Verbal cueing   Wheelchair 150 feet activity     Assist  CBG (last 3)  Recent Labs    09/14/18 1721 09/14/18 2101 09/15/18 0631  GLUCAP 152* 138* 86   Wheelchair 150 feet activity did not occur: Safety/medical concerns(decreased strength/activity tolerance)   Assist Level: Supervision/Verbal cueing   Blood pressure (!) 137/58, pulse (!) 55, temperature 98.2 F (36.8 C), temperature source Oral, resp. rate 18, height 5\' 11"  (1.803 m), weight 108 kg, SpO2 96 %.  Medical Problem List and Plan: 1.Decreased functional mobilitysecondary to right transtibial amputation 08/28/2018 as well as recent right lower extremity revascularization procedure. --Continue CIR therapies including PT, OT  -continue limb guard   8/26- wound VAC removed    8/27- changed dressing to black "sock"- no dressing since no drainage. 2. Antithrombotics: -DVT/anticoagulation:Lovenox -antiplatelet therapy: Aspirin 81 mg daily, Plavix 75 mg daily 3. Pain Management:Robaxin andoxycodoneas needed.  8/25- pt denies pain, but didn't say if b/c took meds.  8/26- refusing pain meds  8/28- encouraged pt to take tylenol at least for L heel pain  8/30--denies pain today 4. Mood:Provide emotional support -antipsychotic agents: N/A 5. Neuropsych: This patientiscapable of making decisions on hisown behalf. 6. Skin/Wound  Care:continue prevena vac per ortho. He is POD #10---dc soon?  8/26- vac off 7. Fluids/Electrolytes/Nutrition:Routine in and outs with follow-up chemistrieson admit.  8/25 Na slightly better from 130 to 132.  8/27- Na 131- stable  9/1- will recheck  8.ABLA.Follow-up CBC  8/25- Drop from 9.4 to 8.9- will recheck Thursday.  8/27- Hb 9.0 9. Hypertension. Cardura 2 mg nightly. Monitor with increased mobility  8/25- BP spike this AM to 160s/66- will monitor  8/30- BP well controlled  10. Diabetes mellitus, hemoglobin A1c 7.0. SSI. Check blood sugars before meals and at bedtime. Lantus insulin 10 units daily. Patient on Glucotrol 5 mg twice daily prior to admission.  -Resume glucotrol as needed -fair control at present.  8/25- [phone]- fairly well controlled  9/1-- reasonably controlled 11. BPH. Proscar 5 mg daily,Flomax 0.4 mg twice daily. Check PVR 12. Hypothyroidism. Synthroid 13. Constipation. Laxative assistance  bm 8/28 14.Hyperlipidemia. Lipitor  15. L heel pain/blister which is open  8/28- will have pt wear shoe, not sock to give more support and do heel protection if needed- con't to monitor   8/31- will order floating L heel and prevalon boot when in bed to treat blister 16 Dispo-  8/26- team conference today- therapy wants limb guard off prn- since wound VAC is off, no need.  -set d/c date as 9/8 based on min-max assist with therapies  8/27- IPOC done LOS: 8 days A FACE TO FACE EVALUATION WAS PERFORMED  [name] 09/15/2018, 9:02 AM

## 2018-09-16 ENCOUNTER — Inpatient Hospital Stay (HOSPITAL_COMMUNITY): Payer: Medicare Other

## 2018-09-16 ENCOUNTER — Encounter (HOSPITAL_COMMUNITY): Payer: Medicare Other | Admitting: Psychology

## 2018-09-16 LAB — CBC WITH DIFFERENTIAL/PLATELET
Abs Immature Granulocytes: 0 10*3/uL (ref 0.00–0.07)
Basophils Absolute: 0.1 10*3/uL (ref 0.0–0.1)
Basophils Relative: 1 %
Eosinophils Absolute: 0.4 10*3/uL (ref 0.0–0.5)
Eosinophils Relative: 7 %
HCT: 29.1 % — ABNORMAL LOW (ref 39.0–52.0)
Hemoglobin: 9.4 g/dL — ABNORMAL LOW (ref 13.0–17.0)
Immature Granulocytes: 0 %
Lymphocytes Relative: 56 %
Lymphs Abs: 3.5 10*3/uL (ref 0.7–4.0)
MCH: 31.8 pg (ref 26.0–34.0)
MCHC: 32.3 g/dL (ref 30.0–36.0)
MCV: 98.3 fL (ref 80.0–100.0)
Monocytes Absolute: 0.6 10*3/uL (ref 0.1–1.0)
Monocytes Relative: 9 %
Neutro Abs: 1.7 10*3/uL (ref 1.7–7.7)
Neutrophils Relative %: 27 %
Platelets: 277 10*3/uL (ref 150–400)
RBC: 2.96 MIL/uL — ABNORMAL LOW (ref 4.22–5.81)
RDW: 18 % — ABNORMAL HIGH (ref 11.5–15.5)
WBC: 6.2 10*3/uL (ref 4.0–10.5)
nRBC: 0 % (ref 0.0–0.2)

## 2018-09-16 LAB — GLUCOSE, CAPILLARY
Glucose-Capillary: 96 mg/dL (ref 70–99)
Glucose-Capillary: 115 mg/dL — ABNORMAL HIGH (ref 70–99)
Glucose-Capillary: 155 mg/dL — ABNORMAL HIGH (ref 70–99)
Glucose-Capillary: 151 mg/dL — ABNORMAL HIGH (ref 70–99)

## 2018-09-16 LAB — BASIC METABOLIC PANEL
Anion gap: 11 (ref 5–15)
BUN: 18 mg/dL (ref 8–23)
CO2: 23 mmol/L (ref 22–32)
Calcium: 8.7 mg/dL — ABNORMAL LOW (ref 8.9–10.3)
Chloride: 99 mmol/L (ref 98–111)
Creatinine, Ser: 1.13 mg/dL (ref 0.61–1.24)
GFR calc Af Amer: >60 mL/min (ref >60)
GFR calc non Af Amer: 57 mL/min — ABNORMAL LOW (ref >60)
Glucose, Bld: 100 mg/dL — ABNORMAL HIGH (ref 70–99)
Potassium: 4.1 mmol/L (ref 3.5–5.1)
Sodium: 133 mmol/L — ABNORMAL LOW (ref 135–145)

## 2018-09-16 NOTE — Progress Notes (Signed)
[place] PHYSICAL MEDICINE & REHABILITATION PROGRESS NOTE   Subjective/Complaints:  Pt reports not taking much tylenol since L heel pain is actually doing better, but then says "it'sa the same".  Doing well otherwise-was happy I got to meet his son yesterday.   ROS: Patient denies fever, rash, sore throat, blurred vision, nausea, vomiting, diarrhea, cough, shortness of breath or chest pain,   headache, or mood change.    Objective:   No results found. Recent Labs    09/16/18 0518  WBC 6.2  HGB 9.4*  HCT 29.1*  PLT 277   Recent Labs    09/14/18 0514 09/16/18 0518  NA  --  133*  K  --  4.1  CL  --  99  CO2  --  23  GLUCOSE  --  100*  BUN  --  18  CREATININE 1.06 1.13  CALCIUM  --  8.7*    Intake/Output Summary (Last 24 hours) at 09/16/2018 0917 Last data filed at 09/16/2018 0600 Gross per 24 hour  Intake 100 ml  Output 1625 ml  Net -1525 ml     Physical Exam: Vital Signs Blood pressure 127/60, pulse (!) 53, temperature 98.2 F (36.8 C), temperature source Oral, resp. rate 16, height 5\' 11"  (1.803 m), weight 108 kg, SpO2 99 %.   Physical Exam Constitutional:  Awake, alert, appropriate, sitting up in manual w/c in room, with PT; doing bathing at sink, NAD   Vital signs and labs reviewed. HEENT: EOMI, oral membranes moist Neck: supple Cardiovascular: bradycardic, regular rhythm    Respiratory: CTA Bilaterally without wheezes or rales. A little coarse x1; cleared with cough  GI: BS +, non-tender, non-distended  Musculoskeletal:Normal range of motion.  Comments: RLE limbguard in place  NO TTP over L heel/arch or ball of foot on L; only back of heel- due to burst blister Neurological: alert and appropriate. HOH  UE 5/5. RLE: can lift against gravity. LLE: 3/5 HF, 4/5 KE, ADF/PF. No focal sensory findings.  Skin: He isnot diaphoretic. Trace edema of LLE RLE incision intact. Minimal drainage R BKA a little bruising along incision; some dried blood- 2  areas more bruising medially and laterally- dog ears pulling in some; still bulbar type edema mild L heel on back has foam dressing- per nursing note, burst blister/open pink skin  Psychiatric: pleasant     Assessment/Plan: 1. 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  Physiatrist is providing close team supervision and 24 hour management of active medical problems listed below.  Physiatrist and rehab team continue to assess barriers to discharge/monitor patient progress toward functional and medical goals  Care Tool:  Bathing    Body parts bathed by patient: Right arm, Left arm, Chest, Abdomen, Front perineal area, Right upper leg, Left upper leg, Buttocks   Body parts bathed by helper: Left lower leg Body parts n/a: Right lower leg   Bathing assist Assist Level: Minimal Assistance - Patient > 75%     Upper Body Dressing/Undressing Upper body dressing   What is the patient wearing?: Pull over shirt    Upper body assist Assist Level: Set up assist    Lower Body Dressing/Undressing Lower body dressing      What is the patient wearing?: Underwear/pull up, Pants     Lower body assist Assist for lower body dressing: Minimal  Assistance - Patient > 75%     Chartered loss adjuster assist Assist for toileting: Maximal Assistance - Patient 25 - 49%     Transfers Chair/bed transfer  Transfers assist     Chair/bed transfer assist level: Contact Guard/Touching assist Chair/bed transfer assistive device: Sliding board   Locomotion Ambulation   Ambulation assist   Ambulation activity did not occur: Safety/medical concerns(decreased strength/activity tolerance)          Walk 10 feet activity   Assist  Walk 10 feet activity did not occur: Safety/medical concerns(decreased  strength/activity tolerance)        Walk 50 feet activity   Assist Walk 50 feet with 2 turns activity did not occur: Safety/medical concerns(decreased strength/activity tolerance)         Walk 150 feet activity   Assist Walk 150 feet activity did not occur: Safety/medical concerns(decreased strength/activity tolerance)         Walk 10 feet on uneven surface  activity   Assist Walk 10 feet on uneven surfaces activity did not occur: Safety/medical concerns(decreased strength/activity tolerance)         Wheelchair     Assist Will patient use wheelchair at discharge?: Yes Type of Wheelchair: Manual    Wheelchair assist level: Supervision/Verbal cueing Max wheelchair distance: 150    Wheelchair 50 feet with 2 turns activity    Assist        Assist Level: Supervision/Verbal cueing   Wheelchair 150 feet activity     Assist  CBG (last 3)  Recent Labs    09/15/18 1729 09/15/18 2150 09/16/18 0612  GLUCAP 184* 128* 96   Wheelchair 150 feet activity did not occur: Safety/medical concerns(decreased strength/activity tolerance)   Assist Level: Supervision/Verbal cueing   Blood pressure 127/60, pulse (!) 53, temperature 98.2 F (36.8 C), temperature source Oral, resp. rate 16, height 5\' 11"  (1.803 m), weight 108 kg, SpO2 99 %.  Medical Problem List and Plan: 1.Decreased functional mobilitysecondary to right transtibial amputation 08/28/2018 as well as recent right lower extremity revascularization procedure. --Continue CIR therapies including PT, OT  -continue limb guard   8/26- wound VAC removed    8/27- changed dressing to black "sock"- no dressing since no drainage. 2. Antithrombotics: -DVT/anticoagulation:Lovenox -antiplatelet therapy: Aspirin 81 mg daily, Plavix 75 mg daily 3. Pain Management:Robaxin andoxycodoneas needed.  8/25- pt denies pain, but didn't say if b/c took meds.  8/26- refusing  pain meds  8/28- encouraged pt to take tylenol at least for L heel pain  8/30--denies pain today  9/2- not taking much tylenol for L heel pain. 4. Mood:Provide emotional support -antipsychotic agents: N/A 5. Neuropsych: This patientiscapable of making decisions on hisown behalf. 6. Skin/Wound Care:continue prevena vac per ortho. He is POD #10---dc soon?  8/26- vac off 7. Fluids/Electrolytes/Nutrition:Routine in and outs with follow-up chemistrieson admit.  8/25 Na slightly better from 130 to 132.  8/27- Na 131- stable  9/1- will recheck  8.ABLA.Follow-up CBC  8/25- Drop from 9.4 to 8.9- will recheck Thursday.  8/27- Hb 9.0 9. Hypertension. Cardura 2 mg nightly. Monitor with increased mobility  8/25- BP spike this AM to 160s/66- will monitor  8/30- BP well controlled  10. Diabetes mellitus, hemoglobin A1c 7.0. SSI. Check blood sugars before meals and at bedtime. Lantus insulin 10 units daily. Patient on Glucotrol 5 mg twice daily prior to admission.  -Resume glucotrol as needed -fair control at present.  8/25- [phone]- fairly well controlled  9/1-- reasonably controlled 11.  BPH. Proscar 5 mg daily,Flomax 0.4 mg twice daily. Check PVR 12. Hypothyroidism. Synthroid 13. Constipation. Laxative assistance  bm 8/28 14.Hyperlipidemia. Lipitor  15. L heel pain/blister which is open  8/28- will have pt wear shoe, not sock to give more support and do heel protection if needed- con't to monitor   8/31- will order floating L heel and prevalon boot when in bed to treat blister  9/2- pain better per pt-not taking much tylenol 16 Dispo-   8/26- team conference today- therapy wants limb guard off prn- since wound VAC is off, no need.  -set d/c date as 9/8 based on min-max assist with therapies  8/27- IPOC done  9/2- team conference today to f/u on goals  LOS: 9 days A FACE TO FACE EVALUATION WAS PERFORMED  Jolin Benavides 09/16/2018, 9:17 AM

## 2018-09-16 NOTE — Progress Notes (Signed)
Occupational Therapy Session Note  Patient Details  Name: [name] MRN: [id number] Date of Birth: 1927-01-16  Today's Date: 09/16/2018 OT Individual Time: 1100-1200 OT Individual Time Calculation (min): 60 min    Short Term Goals: Week 2:  OT Short Term Goal 1 (Week 2): Pt will be able to sit to stand with min A. OT Short Term Goal 2 (Week 2): Pt will be able to don pants over legs with min A. OT Short Term Goal 3 (Week 2): Pt will be able to pull pants up after toileting with mod A. OT Short Term Goal 4 (Week 2): Pt will complete squat pivot transfer to drop arm BSC with min A OT Short Term Goal 5 (Week 2): Pt will complete bathing tasks with min A  Skilled Therapeutic Interventions/Progress Updates:    Pt resting in w/c upon arrival.  OT intervention with focus on w/c mobility, donning/doffing sock/shoe, sit<>stand, standing balance, and BUE therex to increase independence.  Pt propelled w/c to gym and initially engaged in doffing sock and doning sock and shoe with use of AE (sock aid and reacher). Pt commented that he would probably purhcase items at medical supply store in [place]. Pt performed task with supervison and use of AE. Sit<>stand X 4 with min/mod A this session and unable to remain standing greater than 10 seconds.  Pt engaged in BUE therex with 5# bar (rowing, chest presses, and overhead presses 3X10). Pt also engaged in w/c pushups 3X8.  Pt propelled w/c back to room and repositioned w/c appropriately next to bed. Pt remained in w/c with all needs within reach and belt alarm activated.   Therapy Documentation Precautions:  Precautions Precaution Comments: pt with R BKA and splint/shrinker from [employer] Other Brace: splint and shrinker from [employer] Restrictions Weight Bearing Restrictions: Yes RLE Weight Bearing: Non weight bearing  Pain:  Pt denies pain   Therapy/Group: Individual Therapy  [name] 09/16/2018, 12:26 PM

## 2018-09-16 NOTE — Consult Note (Signed)
Neuropsychological Consultation   Patient:   [name]   DOB:   [birth year]  MR Number:  [id number]  Location:  [facility] [address] [facility] [place] [id number] [place] [address] Dept: [place]: [phone]           Date of Service:   09/16/2018  Start Time:   9 AM End Time:   10 AM  Provider/Observer:  Ilean Skill, Psy.D.       Clinical Neuropsychologist       Billing Code/Service: [phone]  Chief Complaint:    [name] is an 85 year old right-handed male with a history of diabetes, hypertension, hyperlipidemia, peripheral vascular disease with recent revascularization procedure right lower extremity.  The patient had been doing his home ADLs and doing his own grocery shopping and other individual needs.  The patient presented on 08/24/2018 with gangrenous right foot and toe that has progressed over the past month.  The limb was felt not to be salvageable and underwent right transtibial amputation on 08/28/2018.  The patient was referred for the comprehensive inpatient rehab program due to need for adjustment functional adaptation to post amputation.  Reason for Service:  The patient was referred for neuropsychological consultation due to coping and adjustment issues.  Below is the HPI for the current admission.  [name] is an 85 year old right-handed male with history of diabetes mellitus, hypertension, hyperlipidemia, peripheral vascular disease maintained on aspirin and Plavix with recent revascularization procedure right lower extremity. Per chart review patient lives alone completely independent up till 2 weeks ago driving doing his own grocery shopping independent ADLs. He does have family in the area who plan to assist as needed on discharge. Presented 08/24/2018 with gangrenous right foot and toe that has progressed over the past month. Limb was not felt to be salvageable and underwent  right transtibial amputation 08/28/2018 per Dr. Sharol Given and application of wound VAC. Splint and shrinker provided by [employer] prosthetics. Hospital course pain management. Subcutaneous Lovenox for DVT prophylaxis. Acute blood loss anemia7-6 8.2and has received 2 units packed red blood cells with mild leukocytosis 12,500.Mild hyponatremia 24monitored.Intermittent bouts of confusion felt to be related to narcotics/multifactorial.Palliative care did follow-up to establish goals of care. Therapy evaluation completed and patient was admitted for a comprehensive rehab program.  Current Status:  The patient denied any significant depressive or anxiety based symptoms.  He was generally oriented with good cognition and mental status.  There was some slowed information processing speed but was not disproportionate for age.  There did not appear to be any significant cognitive deficits present and the patient was trying to figure out how he is can adjust to the.  Without a limb and the potential for getting a prosthetic.  Behavioral Observation: SARGENT MANKEY  presents as a 85 y.o.-year-old Right Caucasian Male who appeared his stated age. his dress was Appropriate and he was Well Groomed and his manners were Appropriate to the situation.  his participation was indicative of Appropriate behaviors.  There were any physical disabilities noted.  he displayed an appropriate level of cooperation and motivation.     Interactions:    Active Appropriate and Redirectable  Attention:   within normal limits and attention span and concentration were age appropriate  Memory:   within normal limits; recent and remote memory intact  Visuo-spatial:  not examined  Speech (Volume):  low  Speech:   normal; normal  Thought Process:  Coherent and Relevant  Though Content:  WNL; not suicidal and not homicidal  Orientation:   person, place, time/date and  situation  Judgment:   Fair  Planning:   Fair  Affect:    Appropriate  Mood:    Dysphoric  Insight:   Fair  Intelligence:   normal  Medical History:   Past Medical History:  Diagnosis Date  . Arthritis   . Borderline diabetic   . Glaucoma   . Hyperlipidemia   . Hypertension   . PVD (peripheral vascular disease) ([place])   . Sleep apnea    Cpap ordered but doesnt use      Psychiatric History:  No prior psychiatric history.  Family Med/Psych History:  Family History  Problem Relation Age of Onset  . Cancer Mother   . Heart attack Father   . Stroke Father   . Heart attack Brother   . Heart attack Brother     Risk of Suicide/Violence: low the patient denies any suicidal or homicidal ideation.  Impression/DX:  [name] is an 85 year old right-handed male with a history of diabetes, hypertension, hyperlipidemia, peripheral vascular disease with recent revascularization procedure right lower extremity.  The patient had been doing his home ADLs and doing his own grocery shopping and other individual needs.  The patient presented on 08/24/2018 with gangrenous right foot and toe that has progressed over the past month.  The limb was felt not to be salvageable and underwent right transtibial amputation on 08/28/2018.  The patient was referred for the comprehensive inpatient rehab program due to need for adjustment functional adaptation to post amputation.  The patient denied any significant depressive or anxiety based symptoms.  He was generally oriented with good cognition and mental status.  There was some slowed information processing speed but was not disproportionate for age.  There did not appear to be any significant cognitive deficits present and the patient was trying to figure out how he is can adjust to the.  Without a limb and the potential for getting a prosthetic.  Disposition/Plan:  The patient appears to be doing well from a neuropsychological standpoint there does not  appear to be a need for follow-up with me.  However, the patient did express a strong desire that he is able to talk with his family and the attending regarding the status.  However, it sounded more like he needed or wanted to talk with the surgeon and who would be in charge of follow-up as far as potential prostatic.        Electronically Signed   _______________________ Ilean Skill, Psy.D.

## 2018-09-16 NOTE — Progress Notes (Addendum)
Physical Therapy Session Note  Patient Details  Name: [name] MRN: [id number] Date of Birth: [birth year]  Today's Date: 09/16/2018 PT Individual Time: 1305-1420 PT Individual Time Calculation (min): 75 min   Short Term Goals: Week 2:  PT Short Term Goal 1 (Week 2): STG=LTG due to ELOS.  Skilled Therapeutic Interventions/Progress Updates:     Patient in w/c with his son in the room upon PT arrival. Patient alert and agreeable to PT session. Patient denied pain throughout session.   Therapeutic Activity: Bed Mobility: Patient performed sit to supine with supervision for safety in a flat bed without use of bed rails. Patient's son stated that he is bringing a hospital bed that was a family members to place in the home, due to narrow doorways into the bedroom. PT educated on hospital bed functions to benefit assisting with mobility and limiting use of functions with mobility as able to promote patient to have increased independence with mobility.  Transfers: Patient performed a stand pivot transfer x1 with min A-CGA to stand and mod A to pivot due to difficulty moving his L LE in standing. Provided verbal cues for hand placement on RW, leaning forward to stand, using UEs to off weight L LE to improve pivoting, and reaching back with his L hand to sit. He performed a slide board transfer to a 18"x18" w/c for improved fit/support for patient and from the w/c to the bed with his son assisting with total A for board placement and set up and CGA for transfer. Provided verbal cues for w/c set up, board placement, hand placement, safe guarding techniques, and head-hips relationship.   Wheelchair Mobility:  Patient propelled wheelchair 150 feet with 2 90 degree turns and a 180 degree turn with supervision and set-up A. Provided verbal cues for patient's son to don/doff leg rests and use of breaks throughout session.  Educated patient and his son on w/c fit to prevent pressure on lateral hips and for LE  support. Started in 18"x16" w/c, tried 20"x18" w/c with improved limb support and space at lateral hips, but difficulty with w/c propulsion x10 feet. He tried a 18"x18" w/c with improved propulsion and limb support and minimal, 1 finger width, space at lateral hips, but no pressure on hips. Educated on trailing 18"x18" w/c for comfort and looking for sings of pressure for the next couple of days to determine if this size will work at d/c. Educated on pressure relief in w/c performing w/c push up with L LE assist x5 every 30 min in sitting. Patient demonstrated 1 set with good technique.  Patient in bed at end of session with breaks locked, bed alarm set, and all needs within reach. Discussed patient's progress with the patient and his son, initiated conversation about his limited potential for being a good candidate for a prosthesis due to decreased activity tolerance and strength with current level of mobility. They were both surprised, but expressed understanding based on education provided.     Therapy Documentation Precautions:  Precautions Precaution Comments: pt with R BKA and splint/shrinker from [employer] Other Brace: splint and shrinker from [employer] Restrictions Weight Bearing Restrictions: Yes RLE Weight Bearing: Non weight bearing    Therapy/Group: Individual Therapy  Malakai Schoenherr L Srikar Chiang PT, DPT  09/16/2018, 4:47 PM

## 2018-09-16 NOTE — Patient Care Conference (Signed)
Inpatient RehabilitationTeam Conference and Plan of Care Update Date: 09/16/2018   Time: 10:00 AM    Patient Name: [name]      Medical Record Number: [id number]  Date of Birth: Dec 27, 1927 Sex: Male         Room/Bed: 4M03C/4M03C-01 Payor Info: Payor: [employer] / Plan: [employer] MEDICARE / Product Type: *No Product type* /    Admitting Diagnosis: 4. Gen Team RT. BKA; 13-16days  Admit Date/Time:  09/07/2018  3:24 PM Admission Comments: No comment available   Primary Diagnosis:  <principal problem not specified> Principal Problem: <principal problem not specified>  Patient Active Problem List   Diagnosis Date Noted  . Pain of left heel   . Unable to maintain weight-bearing   . Type 2 diabetes mellitus with diabetic peripheral angiopathy with gangrene ([place])   . Anemia due to acute blood loss   . Stage 3 chronic kidney disease ([place])   . Right below-knee amputee ([place]) 09/07/2018  . Gangrene of right foot ([place])   . DNR (do not resuscitate) discussion   . Goals of care, counseling/discussion   . Palliative care by specialist   . Type 2 diabetes mellitus with foot ulcer and gangrene ([place])   . Severe protein-calorie malnutrition ([place])   . Ischemic foot 08/20/2018  . Critical lower limb ischemia 08/03/2018  . Type 2 diabetes mellitus with foot ulcer, without long-term current use of insulin ([place])   . Gastroesophageal reflux disease   . Cellulitis of great toe of right foot 05/29/2018  . Atrial fibrillation, chronic   . Chest pain 07/25/2017  . PAF (paroxysmal atrial fibrillation) ([place]) 07/25/2017  . BPH (benign prostatic hyperplasia) 07/25/2017  . Shortness of breath 05/11/2017  . Hypothyroidism 05/11/2017  . Chronic diastolic CHF (congestive heart failure) ([place]) 05/11/2017  . Aortic valve stenosis 06/12/2016  . RBBB 06/12/2016  . PAD (peripheral artery disease) ([place]) 08/04/2012  . Essential hypertension, benign 08/04/2012  . Hyperlipidemia 08/04/2012  . Dizziness  08/04/2012    Expected Discharge Date: Expected Discharge Date: 09/22/18  Team Members Present: Physician leading conference: Dr. Courtney Heys Social Worker Present: Ovidio Kin, LCSW Nurse Present: Rayetta Humphrey, RN PT Present: Apolinar Junes, PT OT Present: Roanna Epley, [place], OT SLP Present: Stormy Fabian, SLP PPS Coordinator present : Gunnar Fusi, SLP     Current Status/Progress Goal Weekly Team Focus  Medical   R BKA; L heel blister- healing- pain has improved somewhat- on back of heel; less bruising/drainage from R BKA  remove staples when due so can get shrinker  eucerin leg so no new skin breakdown; improve pain- tylenol prn   Bowel/Bladder   Continent and occassional incontinent of bladder at night, LBM 09/14/18  Maintain continence  QS/PRN address toileting need   Swallow/Nutrition/ Hydration             ADL's   set up UB self care, LB bathing-mod A; LB dressing-mod A, squat pivot tranfsers-mod A; sit<>stand and standing balance-min A/CGA; toileting-max A  CGA with transfers and LB self care tasks, supervision all other BADLs  BADL training, functional tranfsers, standing balance, activity tolerance, educaiton   Mobility   min A bed mobility, min A slide board transfers, mod A sit<>stand, stand pivot, and squat pivot transfers with posterior lean in standing with RW, S w/c 200' using B UEs  supervision w/c, CGA transfers  strengthening, ROM, limb loss education, edema control, skin integrity, balance, car transfers, level and unlevel SBT, sit<>stand and stand pivot  transfers, patient/family education   Communication             Safety/Cognition/ Behavioral Observations            Pain   Denies pain  < 3  Address QS/PRN   Skin   s/p Right BKA, early gradulation, left heel soreness stage 2, PAFLOR boot and elevation, very sensative to touch     QS/PRN address incisional wound  care      *See Care Plan and progress notes for long and short-term  goals.     Barriers to Discharge  Current Status/Progress Possible Resolutions Date Resolved   Physician    Decreased caregiver support;Home environment access/layout;Wound Care;Weight;Other (comments)  skin breakdown- L heel ulcer  min to mod assist for sit-stand, squat pivot, ADLs  unable to walk so far- due to age/lack of UB strength- need to focus on W/C level      Nursing                  PT  Wound Care  Wound on L heel limiting standing tolerance              OT                  SLP                SW                Discharge Planning/Teaching Needs:  Home with different family members providing 24 hr care. Son here daily to see in therapies and provide support. Will need to begin training.      Team Discussion:  Continuing to make progress in therapies. PT has discharged gait goals due to just able to stand. Goals supervision wheelchair level. L-heel wound not causing pain now and according to MD looks better. Standing in therapies and working on balance issues. Need to begin family training with son in preparation of DC home 9/8.  Revisions to Treatment Plan:  DC 9/8    Continued Need for Acute Rehabilitation Level of Care: The patient requires daily medical management by a physician with specialized training in physical medicine and rehabilitation for the following conditions: Daily direction of a multidisciplinary physical rehabilitation program to ensure safe treatment while eliciting the highest outcome that is of practical value to the patient.: Yes Daily medical management of patient stability for increased activity during participation in an intensive rehabilitation regime.: Yes Daily analysis of laboratory values and/or radiology reports with any subsequent need for medication adjustment of medical intervention for : Post surgical problems;Wound care problems;Other   I attest that I was present, lead the team conference, and concur with the assessment and plan of the  team. Teleconference held due to COVID 19   Tauren Delbuono, Gardiner Rhyme 09/16/2018, 12:57 PM

## 2018-09-16 NOTE — Progress Notes (Signed)
Occupational Therapy Session Note  Patient Details  Name: [name] MRN: [id number] Date of Birth: [birth year]  Today's Date: 09/16/2018 OT Individual Time: 0700-0810 OT Individual Time Calculation (min): 70 min    Short Term Goals: Week 2:  OT Short Term Goal 1 (Week 2): Pt will be able to sit to stand with min A. OT Short Term Goal 2 (Week 2): Pt will be able to don pants over legs with min A. OT Short Term Goal 3 (Week 2): Pt will be able to pull pants up after toileting with mod A. OT Short Term Goal 4 (Week 2): Pt will complete squat pivot transfer to drop arm BSC with min A OT Short Term Goal 5 (Week 2): Pt will complete bathing tasks with min A  Skilled Therapeutic Interventions/Progress Updates:    Pt resting in bed upon arrival and ready to start his day.  OT intervention with focus on bed mobility, sit<>stand, sliding board transfers, standing balance, BADL training, discharge planning, activity tolerance, and safety awareness to increase independence with BADLs. Supine>sit EOB with supervison and use of bed rails.  Sliding board transfer bed >w/c with close supervision after board placement.  Pt performed sit<>stand with RW at sink X 4 for LB bathing and dressing tasks.  PT requires CGA for sit<>stand and min A for standing balance during bathing and pulling up pants. Pt able to thread pants over BLE this morning without assistance but required assistance pulling pants over hips.  Pt requires more than a reasonable amount of time with multiple rest breaks to complete tasks.  Pt remained in w/c with belt alarm actiated and all needs within reach.   Therapy Documentation Precautions:  Precautions Precaution Comments: pt with R BKA and splint/shrinker from [employer] Other Brace: splint and shrinker from [employer] Restrictions Weight Bearing Restrictions: Yes RLE Weight Bearing: Non weight bearing Pain:  Pt c/o L heel "hurts a little"; MD aware   Therapy/Group: Individual  Therapy  [name] 09/16/2018, 8:14 AM

## 2018-09-17 ENCOUNTER — Inpatient Hospital Stay (HOSPITAL_COMMUNITY): Payer: Medicare Other

## 2018-09-17 ENCOUNTER — Inpatient Hospital Stay (HOSPITAL_COMMUNITY): Payer: Medicare Other | Admitting: *Deleted

## 2018-09-17 LAB — GLUCOSE, CAPILLARY
Glucose-Capillary: 106 mg/dL — ABNORMAL HIGH (ref 70–99)
Glucose-Capillary: 105 mg/dL — ABNORMAL HIGH (ref 70–99)
Glucose-Capillary: 182 mg/dL — ABNORMAL HIGH (ref 70–99)
Glucose-Capillary: 131 mg/dL — ABNORMAL HIGH (ref 70–99)

## 2018-09-17 NOTE — Progress Notes (Signed)
[facility] [place] PHYSICAL MEDICINE & REHABILITATION PROGRESS NOTE   Subjective/Complaints:  Pt reports had to take tylenol last night since L heel really hurt- tylenol was helpful.  Standing to get underwear up above buttocks/hips with therapy- was tired after standing ~ 1 minute with RW and OT assistance/support.  ROS: Patient denies fever, rash, sore throat, blurred vision, nausea, vomiting, diarrhea, cough, shortness of breath or chest pain,   headache, or mood change.    Objective:   No results found. Recent Labs    09/16/18 0518  WBC 6.2  HGB 9.4*  HCT 29.1*  PLT 277   Recent Labs    09/16/18 0518  NA 133*  K 4.1  CL 99  CO2 23  GLUCOSE 100*  BUN 18  CREATININE 1.13  CALCIUM 8.7*    Intake/Output Summary (Last 24 hours) at 09/17/2018 1044 Last data filed at 09/17/2018 0903 Gross per 24 hour  Intake 120 ml  Output 1440 ml  Net -1320 ml     Physical Exam: Vital Signs Blood pressure (!) 129/54, pulse (!) 55, temperature 98.2 F (36.8 C), temperature source Oral, resp. rate 17, height 5\' 11"  (1.803 m), weight 108 kg, SpO2 96 %.   Physical Exam Constitutional:  Awake, alert, appropriate, sitting up in manual w/c in room, with OT; working on standing to get underwear up completely- stood x1 minute- actually a little SOB afterwards for 1 minute, NAD   Vital signs and labs reviewed. HEENT: EOMI, oral membranes moist Neck: supple Cardiovascular: bradycardic, regular rhythm    Respiratory: CTA Bilaterally without wheezes or rales.  GI: BS +, non-tender, non-distended  Musculoskeletal:Normal range of motion.  Comments: RLE limbguard in place  NO TTP over L heel/arch or ball of foot on L; only back of heel- due to burst blister Neurological: alert and appropriate. HOH  UE 5/5. RLE: can lift against gravity. LLE: 3/5 HF, 4/5 KE, ADF/PF. No focal sensory findings.  Skin: He isnot diaphoretic. Trace edema of LLE RLE incision intact. Minimal drainage R BKA a  little bruising along incision; some dried blood- 2 areas more bruising medially and laterally- dog ears pulling in some; still bulbar type edema mild L heel on back has foam dressing- per nursing note, burst blister/open pink skin  -Buttocks- has a large purple/brown bruise between cheeks, but no skin breakdown- also has variable coloring of bruising on abdomen from lovenox injections. Psychiatric: pleasant , a little more quiet today    Assessment/Plan: 1. 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  Physiatrist is providing close team supervision and 24 hour management of active medical problems listed below.  Physiatrist and rehab team continue to assess barriers to discharge/monitor patient progress toward functional and medical goals  Care Tool:  Bathing    Body parts bathed by patient: Right arm, Left arm, Chest, Abdomen, Front perineal area, Right upper leg, Left upper leg, Buttocks   Body parts bathed by helper: Left lower leg Body parts n/a: Right lower leg   Bathing assist Assist Level: Minimal Assistance - Patient > 75%     Upper Body Dressing/Undressing Upper body dressing   What is the patient wearing?: Pull over shirt    Upper body assist Assist Level: Set up assist    Lower Body Dressing/Undressing Lower body dressing      What is the  patient wearing?: Underwear/pull up, Pants     Lower body assist Assist for lower body dressing: Minimal Assistance - Patient > 75%     Toileting Toileting    Toileting assist Assist for toileting: Maximal Assistance - Patient 25 - 49%     Transfers Chair/bed transfer  Transfers assist     Chair/bed transfer assist level: Minimal Assistance - Patient > 75% Chair/bed transfer assistive device: Armrests, Orthosis    Locomotion Ambulation   Ambulation assist   Ambulation activity did not occur: Safety/medical concerns(decreased strength/activity tolerance)          Walk 10 feet activity   Assist  Walk 10 feet activity did not occur: Safety/medical concerns(decreased strength/activity tolerance)        Walk 50 feet activity   Assist Walk 50 feet with 2 turns activity did not occur: Safety/medical concerns(decreased strength/activity tolerance)         Walk 150 feet activity   Assist Walk 150 feet activity did not occur: Safety/medical concerns(decreased strength/activity tolerance)         Walk 10 feet on uneven surface  activity   Assist Walk 10 feet on uneven surfaces activity did not occur: Safety/medical concerns(decreased strength/activity tolerance)         Wheelchair     Assist Will patient use wheelchair at discharge?: Yes Type of Wheelchair: Manual    Wheelchair assist level: Supervision/Verbal cueing Max wheelchair distance: 150    Wheelchair 50 feet with 2 turns activity    Assist        Assist Level: Supervision/Verbal cueing   Wheelchair 150 feet activity     Assist  CBG (last 3)  Recent Labs    09/16/18 1659 09/16/18 2104 09/17/18 0611  GLUCAP 155* 151* 106*   Wheelchair 150 feet activity did not occur: Safety/medical concerns(decreased strength/activity tolerance)   Assist Level: Supervision/Verbal cueing   Blood pressure (!) 129/54, pulse (!) 55, temperature 98.2 F (36.8 C), temperature source Oral, resp. rate 17, height 5\' 11"  (1.803 m), weight 108 kg, SpO2 96 %.  Medical Problem List and Plan: 1.Decreased functional mobilitysecondary to right transtibial amputation 08/28/2018 as well as recent right lower extremity revascularization procedure. --Continue CIR therapies including PT, OT  -continue limb guard   8/26- wound VAC removed    8/27- changed dressing to black "sock"- no  dressing since no drainage.  9/3- continuing limb guard and black sock 2. Antithrombotics: -DVT/anticoagulation:Lovenox -antiplatelet therapy: Aspirin 81 mg daily, Plavix 75 mg daily 3. Pain Management:Robaxin andoxycodoneas needed.  8/25- pt denies pain, but didn't say if b/c took meds.  8/26- refusing pain meds  8/28- encouraged pt to take tylenol at least for L heel pain  8/30--denies pain today  9/2- not taking much tylenol for L heel pain.  9/3- requiring tylenol still intermittently- took last night.  4. Mood:Provide emotional support -antipsychotic agents: N/A 5. Neuropsych: This patientiscapable of making decisions on hisown behalf. 6. Skin/Wound Care:continue prevena vac per ortho. He is POD #10---dc soon?  8/26- vac off 7. Fluids/Electrolytes/Nutrition:Routine in and outs with follow-up chemistrieson admit.  8/25 Na slightly better from 130 to 132.  8/27- Na 131- stable  9/1- will recheck  9/3- Na+ 133  8.ABLA.Follow-up CBC  8/25- Drop from 9.4 to 8.9- will recheck Thursday.  8/27- Hb 9.0 9. Hypertension. Cardura 2 mg nightly. Monitor with increased mobility  8/25- BP spike this AM to 160s/66- will monitor  8/30- BP well controlled  10. Diabetes mellitus, hemoglobin A1c 7.0.  SSI. Check blood sugars before meals and at bedtime. Lantus insulin 10 units daily. Patient on Glucotrol 5 mg twice daily prior to admission.  -Resume glucotrol as needed -fair control at present.  8/25- [phone]- fairly well controlled  9/1-- reasonably controlled 11. BPH. Proscar 5 mg daily,Flomax 0.4 mg twice daily. Check PVR 12. Hypothyroidism. Synthroid 13. Constipation. Laxative assistance  bm 8/28 14.Hyperlipidemia. Lipitor  15. L heel pain/blister which is open  8/28- will have pt wear shoe, not sock to give more support and do heel protection if needed- con't to monitor   8/31- will order floating L heel  and prevalon boot when in bed to treat blister  9/2- pain better per pt-not taking much tylenol 16 Dispo-   8/26- team conference today- therapy wants limb guard off prn- since wound VAC is off, no need.  -set d/c date as 9/8 based on min-max assist with therapies  8/27- IPOC done  9/2- team conference today to f/u on goals  LOS: 10 days A FACE TO [place] 09/17/2018, 10:44 AM

## 2018-09-17 NOTE — Progress Notes (Signed)
Social Work Patient ID: [name], male   DOB: 1927-04-24, 85 y.o.   MRN: [id number]     Diagnosis codes: Z89.511, E11.52 & I50.32  Height:  5'11              Weight: 229 lbs            Patient suffers from R- BKA   which impairs their ability to perform daily activities like ADL's and tolieting   in the home.  A walker  will not resolve issue with performing activities of daily living.  A wheelchair will allow patient to safely perform daily activities.  Patient is not able to propel themselves in the home using a standard weight wheelchair due to  Endurance and fatigue .  Patient can self propel in the lightweight wheelchair.

## 2018-09-17 NOTE — Progress Notes (Signed)
Physical Therapy Session Note  Patient Details  Name: [name] MRN: [id number] Date of Birth: [birth year]  Today's Date: 09/17/2018 PT Individual Time: 1140-1210 and 1305-1420 PT Individual Time Calculation (min): 30 min and 75 min  Short Term Goals: Week 2:  PT Short Term Goal 1 (Week 2): STG=LTG due to ELOS.  Skilled Therapeutic Interventions/Progress Updates:     Session 1: Patient in w/c with recreational therapist, Lattie Haw, upon PT arrival. Patient alert and agreeable to PT session. Patient denied pain, however reported that he did not sleep well last night due to his L heel discomfort and that he felt very tired today.   Wheelchair Mobility:  Patient propelled wheelchair 100 feet x2 and navigated around obstacles in the hallway and in tight spaces within the room to simulate home environment with supervision for safety without cues from therapist. Provided verbal cues for donning/doffing leg rest and limb guard and patient able to perform with hand-over-hand assist. Educated on importance of being able to teach someone how to assist him with this at home.  He propelled the w/c up/dow a ramp first forwards ascending and descending with min-mod A then on second trial he ascended backwards and descended forwards with B UEs and his L LE with CGA-min A.   Patient in w/c at end of session with breaks locked, seat belt alarm set, and all needs within reach.   Session 2: Patient in w/c with his son in the room upon PT arrival. Patient alert and agreeable to PT session. Patient denied pain throughout session. Patient's son provided home measurement sheet at beginning of session and discussed home set up. Patient's lunch tray arrived late and he wanted to eat lunch. PT doffed his limb guard and shrinker to inspect his incision and educate the patient and his son on signs and symptoms of infections, performing daily skin checks on B LEs, strategies to reduce positional pressure, purpose of limb  guard and shrinker for protection and edema control. Patient's son donned shrinker and limb guard with min cues from therapist to avoid slack at the bottom of the shrinker and smooth out any wrinkles to avoid pressure sores. Patient and his son were very receptive to all education while the patient ate his lunch.   Therapeutic Activity: Transfers: Patient performed sit to/from stand x1 with min A-CGA and stand pivot x1 with mod A and significant increased time and cues for pivoting due to decreased UE and L LE strength to lift heel to pivot using the RW. Provided verbal cues for scooting forward and leaning forward to stand, extending L LE in standing, using UEs to assist with lifting L heel during pivot, and reaching back with his L hand to sit. He performed a car transfer with the seat height set to 24" to simulate his daughter's vehicle using a slide board with min A to get in and CGA to get out of the car. Recline seat to bring LEs into the car. He also performed a level slide board transfer from the bed to the w/c with CGA. Required total A for board placement, however, patient was able to provide cues for how to place the board and used teach back method for head-hips relationship and hand placement with min cues.   Wheelchair Mobility:  Patient propelled wheelchair 100 feet x2 with supervision. Provided verbal cues for doffing/donning leg rests with intermittent hand-over-hand assist and locking breaks.   Patient in w/c with his son and Jacqlyn Larsen, CSW in room at  end of session with breaks locked, seat belt alarm set, and all needs within reach.    Therapy Documentation Precautions:  Precautions Precaution Comments: pt with R BKA and splint/shrinker from [employer] Other Brace: splint and shrinker from [employer] Restrictions Weight Bearing Restrictions: Yes RLE Weight Bearing: Non weight bearing    Therapy/Group: Individual Therapy  Diannah Rindfleisch L Lakeishia [place] PT, DPT  09/17/2018, 4:31 PM

## 2018-09-17 NOTE — Progress Notes (Signed)
Social Work Patient ID: [name], male   DOB: Aug 30, 1927, 85 y.o.   MRN: [id number]  Met with pt and son who was here to discuss team conference progress toward his goals of CGA wheelchair level. Son aware gait goal was discharged due to inability to meet it while here. Discussed having daughter come in and go through education also along with son. He will talk with and have come on the weekend or Monday when off from school. Discussed equipment and have made referral for wheelchair, transfer board and drop-arm bedside commode. No preference regarding home health. Will work toward discharge next Tuesday.

## 2018-09-17 NOTE — Progress Notes (Addendum)
Physical Therapy Session Note  Patient Details  Name: [name] MRN: [id number] Date of Birth: 1927-08-09  Today's Date: 09/17/2018 PT Individual Time: 0915-1010 PT Individual Time Calculation (min): 55 min   Short Term Goals: Week 2:  PT Short Term Goal 1 (Week 2): STG=LTG due to ELOS.  Skilled Therapeutic Interventions/Progress Updates:    w/c propulsion with BUE and LLE x 150' with supervision and extra time with cues for efficient propulsion technique. Focused on w/c parts management and set up for transfers (added bright orange tape on brakes and leg rests for easier identification) and squat pivot transfers. Pt requires min assist and cues for technique to increase push through LLE to clear bottom vs scooting. Blocked practice x 3 reps each direction. On mat focused on scooting along edge of mat to R and L with focus on technique to lift up with min assist. Sit <> stands x 3 reps for functional LE strengthening to aid with transfers and transitional movements with mod assist and verbal cues for technique. Pt able to maintain standing with min assist with RW; cues for extension through L knee, upright posture, and attempted to take a step forward with focus on using scapular depression vs hopping on foot (unable to fully advance LLE). Repeated transfer sequence back to w/c with emphasis on pt returning parts on w/c with verbal cues and min physical assist. W/c propulsion back to room for functional strengthening and mobility training.   Pt did become tearful at start of session when discussing progress and if he was going to have a prosthetic. Deferred to discussion with physician about this as this PT not sure of the plan, and encouragement and emotional support provided.  Therapy Documentation Precautions:  Precautions Precaution Comments: pt with R BKA and splint/shrinker from [employer] Other Brace: splint and shrinker from [employer] Restrictions Weight Bearing Restrictions: Yes RLE  Weight Bearing: Non weight bearing  Pain: Pain Assessment Pain Scale: 0-10 Pain Score: 0-No painreports some discomfort on L heel where dressing is. Donned shoe for skin protection.   Therapy/Group: Individual Therapy  [name], PT, DPT, CBIS  09/17/2018, 10:14 AM

## 2018-09-17 NOTE — Progress Notes (Signed)
Occupational Therapy Session Note  Patient Details  Name: [name] MRN: [id number] Date of Birth: [birth year]  Today's Date: 09/17/2018 OT Individual Time: 9242-6834 OT Individual Time Calculation (min): 75 min    Short Term Goals: Week 2:  OT Short Term Goal 1 (Week 2): Pt will be able to sit to stand with min A. OT Short Term Goal 2 (Week 2): Pt will be able to don pants over legs with min A. OT Short Term Goal 3 (Week 2): Pt will be able to pull pants up after toileting with mod A. OT Short Term Goal 4 (Week 2): Pt will complete squat pivot transfer to drop arm BSC with min A OT Short Term Goal 5 (Week 2): Pt will complete bathing tasks with min A  Skilled Therapeutic Interventions/Progress Updates:    OT intervention with focus on bed mobility, sitting balance, sit<>stand, sliding board tranfsers, squat pivot transfers, BADL training, bathing at shower level, and dressing with sit<>stand from w/c at sink.  Pt required min A for sliding board transfer to w/c and min A for squat pivot transfer into shower with grab bars.  Pt used lateral leans for bathing buttocks in shower.  Pt required mod A for sit<>stand at sink for LB dressing tasks.  Pt stated he didn't feel like he had as much strength today, possibly from restless night.  Pt required min A for standing balance. Pt remained in w/c with all needs within reach and belt alarm activated.   Therapy Documentation Precautions:  Precautions Precaution Comments: pt with R BKA and splint/shrinker from [employer] Other Brace: splint and shrinker from [employer] Restrictions Weight Bearing Restrictions: Yes RLE Weight Bearing: Non weight bearing  Pain: Pt c/o L heel continues to hurt and prevented sleep previous night; MD aware and repositioned  Therapy/Group: Individual Therapy  [name] 09/17/2018, 12:21 PM

## 2018-09-17 NOTE — Progress Notes (Signed)
Recreational Therapy Session Note  Patient Details  Name: [name] MRN: [id number] Date of Birth: 1927/04/26 Today's Date: 09/17/2018 Time:  11-1133 Pain: no c/o Skilled Therapeutic Interventions/Progress Updates: Session focused on activity tolerance, w/c mobility & discharge planning.  Education provided to pt on energy conservation techniques, home modifications for safety in regards to setting things up at a level that he could safely reach from w/c level.  Pt practiced maneuvering w/c in ADL including retrieving items from the refrigerator with supervision using BUEs.   Therapy/Group: Individual Therapy Neziah Braley 09/17/2018, 11:34 AM

## 2018-09-17 NOTE — Progress Notes (Signed)
Physical Therapy Discharge Summary  Patient Details  Name: [name] MRN: [id number] Date of Birth: 02/22/1927  Today's Date: 09/21/2018 PT Individual Time: 2094-7096 PT Individual Time Calculation (min): 60 min    Patient has met 7 of 8 long term goals due to improved activity tolerance, improved balance, improved postural control, increased strength, increased range of motion, decreased pain and ability to compensate for deficits.  Patient to discharge at a wheelchair level [place].   Patient's care partner is independent to provide the necessary physical assistance at discharge.  Reasons goals not met: Patient can perform sit<>stand with CGA, but needs min A with fatigue. Patient is discharging w/c level and using a slide board for transfers and does not require performing sit<>stand to perform daily tasks at this time. Plan to continue to progress towards this goal with HHPT.   Recommendation:  Patient will benefit from ongoing skilled PT services in home health setting to continue to advance safe functional mobility, address ongoing impairments in strength, activity tolerance, functional mobility, w/c mobility, balance, ROM, edema control, skin integrity, limb loss education, patient/caregiver education, and minimize fall risk.  Equipment: 18"x18" light weight w/c with standard seat cushion, amputee pad, and L drop down leg rest, and 30" slide board  Reasons for discharge: treatment goals met  Patient/family agrees with progress made and goals achieved: Yes  Skilled Therapeutic Intervention: Patient in w/c upon PT arrival. Patient alert and agreeable to PT session. Patient reported that he did not sleep well last night. He also stated that his R wrist bothered him earlier with OT when he pushed to stand. PT assessed no pain throughout all PROM and AROM, limited wrist extension noted compared to L.   Therapeutic Activity: Bed Mobility: Patient performed supine to/from sit and  rolling R/L with supervision on a mat table. Provided verbal cues for reaching across midline with R UE and LE to roll R. Transfers: Patient performed sit to/from stand x1 with CGA-min A using a RW, required increased assist due to fatigue from not sleeping well last night. Provided verbal cues for leaning forward to stand, hand set up with RW, and reaching back with his L hand to sit. He performed a slide board transfer to a slightly elevated mat table with max A for board placement and CGA-close supervision for transfer. Patient required min cues for w/c set up, and was able to tell PT how to place the board. He performed a car transfer with the same set up as above with min A-CGA to enter the car and CGA-close supervision to exit the car. Car seat was set to 24" hight to simulate his daughter's vehicle.   Wheelchair Mobility:  Patient propelled wheelchair 100 feet x2 with supervision and set-up assist. Provided verbal cues for donning/doffing leg rests with hand-over-hand assist. Patient set the brakes appropriately throughout session without cueing. He went up/down a ramp x2 ascending backwards and descending forwards using B UEs and his L LE with min A-CGA.   Therapeutic Exercise: Patient performed the following exercises from his HEP handout with min verbal and tactile cues for proper technique. -L quad sets x10 with 5 sec hold -B glut sets with 5 sec hold -L SLR x10 -Side lying L hip abduction  x10 -B isometric hip adduction with towel roll x10 with 5 sec hold -L bridging x10 -w/c pushups x5  Educated on using w/c push-ups or lateral leans as pressure relief every 30 min in the chair. Patient unable to recall  education from previous sessions. PT added instructions to HEP handout.   Patient required increased time will all activities throughout session due to fatigue. Educated on energy conservation techniques and scheduling activity with breaks throughout the day at home.   Patient in w/c  at end of session with breaks locked, seat belt alarm set, and all needs within reach. While seated in the w/c PT asked him what how he would pick up a piece of paper on the floor. He grabbed his reacher and picked up a piece of paper the PT put down while seated in the w/c independently. PT educated on fall risk/preventions and activating emergency response in the event of a fall. Patient in agreement with all education.    PT Discharge Precautions/Restrictions Precautions Precautions: Fall Restrictions Weight Bearing Restrictions: Yes RLE Weight Bearing: Non weight bearing Pain Pain Assessment Pain Scale: 0-10 Pain Score: 0-No pain Vision/Perception  Perception Perception: Within Functional Limits Praxis Praxis: Intact  Cognition Overall Cognitive Status: Within Functional Limits for tasks assessed Arousal/Alertness: Awake/alert Orientation Level: Oriented X4 Attention: Focused;Sustained Focused Attention: Appears intact Sustained Attention: Appears intact Memory: Impaired Memory Impairment: Decreased recall of new information;Decreased short term memory Decreased Short Term Memory: Verbal complex;Functional complex Awareness: Appears intact Problem Solving: Appears intact Safety/Judgment: Appears intact Sensation Sensation Hot/Cold: Appears Intact Proprioception: Appears Intact Stereognosis: Not tested Coordination Gross Motor Movements are Fluid and Coordinated: Yes Fine Motor Movements are Fluid and Coordinated: Yes Motor  Motor Motor: Other (comment) Motor - Discharge Observations: improving, continues to have decreased strength, activity tolerance, and balance due to R BKA  Mobility Bed Mobility Bed Mobility: Rolling Right;Rolling Left;Supine to Sit;Sit to Supine Rolling Right: Supervision/verbal cueing Rolling Left: Supervision/Verbal cueing Supine to Sit: Supervision/Verbal cueing Sit to Supine: Supervision/Verbal cueing Transfers Transfers: Sit to  Stand;Stand to Sit;Stand Pivot Transfers;Lateral/Scoot Transfers Sit to Stand: Contact Guard/Touching assist Stand to Sit: Contact Guard/Touching assist Stand Pivot Transfers: Moderate Assistance - Patient 50 - 74% Stand Pivot Transfer Details: Visual cues/gestures for sequencing;Verbal cues for gait pattern;Verbal cues for sequencing;Verbal cues for technique;Verbal cues for precautions/safety;Verbal cues for safe use of DME/AE Stand Pivot Transfer Details (indicate cue type and reason): Provide cues for use of RW, pivoting technique, hand placement on RW, and reaching back to sit. Lateral/Scoot Transfers: Water engineer (Assistive device): Rolling walker(slide board for lateral scoot transfers) Locomotion  Gait Ambulation: No Gait Gait: No Stairs / Additional Locomotion Stairs: No Ramp: Minimal Assistance - Patient >75%(in w/c with B UE and L LE ascending backwards and descending forwards) Product manager Mobility: Yes Wheelchair Assistance: Chartered loss adjuster: Both upper extremities;Left lower extremity Wheelchair Parts Management: Needs assistance Distance: >200'  Trunk/Postural Assessment  Cervical Assessment Cervical Assessment: Exceptions to WFL(forward head) Thoracic Assessment Thoracic Assessment: Exceptions to WFL(kyphotic) Lumbar Assessment Lumbar Assessment: Exceptions to WFL(posterior pelvic tilt) Postural Control Postural Control: Deficits on evaluation(decreased)  Balance Balance Balance Assessed: Yes Static Sitting Balance Static Sitting - Balance Support: Right upper extremity supported;Left upper extremity supported;Feet supported Static Sitting - Level of Assistance: 6: Modified independent (Device/Increase time) Dynamic Sitting Balance Dynamic Sitting - Balance Support: During functional activity;Right upper extremity supported;Left upper extremity supported;Feet supported Dynamic Sitting -  Level of Assistance: 5: Stand by assistance Dynamic Sitting - Balance Activities: Lateral lean/weight shifting;Forward lean/weight shifting;Reaching for objects Sitting balance - Comments: decreased lean to the R to maintain balance Static Standing Balance Static Standing - Balance Support: Bilateral upper extremity supported;During functional activity Static Standing - Level of Assistance:  4: Min assist(CGA) Dynamic Standing Balance Dynamic Standing - Balance Support: Bilateral upper extremity supported;During functional activity Dynamic Standing - Level of Assistance: 4: Min assist(CGA for balance and safety) Dynamic Standing - Balance Activities: Forward lean/weight shifting Extremity Assessment  RUE Assessment RUE Assessment: Within Functional Limits Active Range of Motion (AROM) Comments: WFL for all functional mobility General Strength Comments: Grossly 4+-5/5 in sitting throughout. LUE Assessment LUE Assessment: Within Functional Limits Active Range of Motion (AROM) Comments: WFL for all functional mobility General Strength Comments: Grossly 4+-5/5 throughout in sitting. RLE Assessment RLE Assessment: Exceptions to [facility] Active Range of Motion (AROM) Comments: lacking 6 degrees knee extension and knee flexion to 92 degrees General Strength Comments: Grossly in sitting: 4+/5 hip flexion and 4/5 knee extension/flexion through available range LLE Assessment LLE Assessment: Exceptions to [facility] Active Range of Motion (AROM) Comments: WFL for all functional mobility General Strength Comments: Grossly in sitting: 4+/5 hip flexion, 5/5 knee flexion/extension, 4+/5 DF/PF    Devonia Farro L Naevia Unterreiner PT, DPT  09/21/2018, 12:52 PM

## 2018-09-18 ENCOUNTER — Inpatient Hospital Stay (HOSPITAL_COMMUNITY): Payer: Medicare Other

## 2018-09-18 ENCOUNTER — Inpatient Hospital Stay (HOSPITAL_COMMUNITY): Payer: Medicare Other | Admitting: Physical Therapy

## 2018-09-18 LAB — GLUCOSE, CAPILLARY
Glucose-Capillary: 84 mg/dL (ref 70–99)
Glucose-Capillary: 91 mg/dL (ref 70–99)
Glucose-Capillary: 216 mg/dL — ABNORMAL HIGH (ref 70–99)
Glucose-Capillary: 159 mg/dL — ABNORMAL HIGH (ref 70–99)

## 2018-09-18 NOTE — Progress Notes (Signed)
Physical Therapy Session Note  Patient Details  Name: [name] MRN: [id number] Date of Birth: Dec 29, 1927  Today's Date: 09/18/2018 PT Individual Time: 1300-1410 PT Individual Time Calculation (min): 70 min   Short Term Goals: Week 2:  PT Short Term Goal 1 (Week 2): STG=LTG due to ELOS.  Skilled Therapeutic Interventions/Progress Updates:    Pt's son, [name], present during session and focused on family education in preparation for upcoming discharge. Focused session on [facility] transfers with slideboard and lateral leans for home management. Demonstrated technique with drop arm BSC (w/c <> BSC) with education on importance of parts management, positioning of w/c, set-up and positioning of slideboard, and technique. Simulated pants up/down with theraband (pt did not need to use commode at this time) with demonstration using technique for lateral leans and pt working more to manage clothing (cues for really needing to lean to the side and demonstrated using w/c or other chair/bed for support and removing armrests) as well as technique for chair push up and caregiver providing dependent assist to manage clothing or hygiene. Pt required CGA to min assist for slideboard transfer w/c <> BSC and verbal and tactile cues for technique and hand placement. [name] able to return demonstrate w/c <> BSC with appropriate guarding technique and ability to position slideboard correctly. Reviewed HEP in pt education folder and education provided on importance of pressure relief techniques with recommendation for every 30 min to at least once every hour when up in w/c for w/c push -ups, transfer, and/or lateral leans. Educated on risk for skin breakdown due to decreased mobility status and both verbalized understanding. Planning for further family education this weekend.   Therapy Documentation Precautions:  Precautions Precaution Comments: pt with R BKA and splint/shrinker from [employer] Other Brace: splint and  shrinker from [employer] Restrictions Weight Bearing Restrictions: Yes RLE Weight Bearing: Non weight bearing Pain:  Denies pain.   Therapy/Group: Individual Therapy  [name], PT, DPT, CBIS  09/18/2018, 3:21 PM

## 2018-09-18 NOTE — Discharge Instructions (Signed)
Inpatient Rehab Discharge Instructions  [name] Discharge date and time: No discharge date for patient encounter.   Activities/Precautions/ Functional Status: Activity: activity as tolerated Diet: diabetic diet Wound Care: keep wound clean and dry Functional status:  ___ No restrictions     ___ Walk up steps independently ___ 24/7 supervision/assistance   ___ Walk up steps with assistance ___ Intermittent supervision/assistance  ___ Bathe/dress independently ___ Walk with walker     __x_ Bathe/dress with assistance ___ Walk Independently    ___ Shower independently ___ Walk with assistance    ___ Shower with assistance ___ No alcohol     ___ Return to work/school ________  Special Instructions: No driving, smoking or alcohol    COMMUNITY REFERRALS UPON DISCHARGE:    Home Health:   PT, OT, RN  [place]   XTKWI:[phone]   Date of last service:09/22/2018  Medical Equipment/Items Ordered:WHEELCHAIR, [place]  Agency/Supplier:[facility]   [phone]   My questions have been answered and I understand these instructions. I will adhere to these goals and the provided educational materials after my discharge from the hospital.  Patient/Caregiver Signature _______________________________ Date __________  Clinician Signature _______________________________________ Date __________  Please bring this form and your medication list with you to all your follow-up doctor's appointments.

## 2018-09-18 NOTE — Progress Notes (Signed)
Occupational Therapy Session Note  Patient Details  Name: [name] MRN: [id number] Date of Birth: 04-22-1927  Today's Date: 09/18/2018 OT Individual Time: 0700-0810 OT Individual Time Calculation (min): 70 min    Short Term Goals: Week 2:  OT Short Term Goal 1 (Week 2): Pt will be able to sit to stand with min A. OT Short Term Goal 2 (Week 2): Pt will be able to don pants over legs with min A. OT Short Term Goal 3 (Week 2): Pt will be able to pull pants up after toileting with mod A. OT Short Term Goal 4 (Week 2): Pt will complete squat pivot transfer to drop arm BSC with min A OT Short Term Goal 5 (Week 2): Pt will complete bathing tasks with min A  Skilled Therapeutic Interventions/Progress Updates:    OT intervention with focus on bed mobility, sliding board transfers, sit<>stand, standing balance, BADL training, education, discharge planning, activity tolerance, and safety awareness to increase independence with BADLs.  Pt required min A for sliding board transfer to w/c this morning. Discussed importance of having shorts or pants on when attempting sliding board transfers.  Pt engaged in bathing/dressing with sit<>stand from w/c at sink.  Pt able to stand with CGA initially for sit<>stand but pt with increased fatigue with subsequent sit<>stand.  Pt able to doff unerpants seated in w/c with lateral leans but unable to donn while seated.  Discussed home arrangement.  Pt unable to access bathroom at w/c level.  Recommended bathing/dressing be completed at EOB using lateral leans for LB clothing management and bathing buttocks. Pt required more than a reasonable amount of time to completed tasks with multiple extended rest breaks. Pt reamined seated in w/c with all needs within reach and bed alarm activated.   Therapy Documentation Precautions:  Precautions Precaution Comments: pt with R BKA and splint/shrinker from [employer] Other Brace: splint and shrinker from  [employer] Restrictions Weight Bearing Restrictions: Yes RLE Weight Bearing: Non weight bearing   Pain:  Pt reports that his heel feels better and doesn't hurt   Therapy/Group: Individual Therapy  [name] 09/18/2018, 8:18 AM

## 2018-09-18 NOTE — Progress Notes (Signed)
[place] PHYSICAL MEDICINE & REHABILITATION PROGRESS NOTE   Subjective/Complaints:  Pt reports no significant pain in L heel in last 24 hours. Can feel L heel, but not painful.No issues- just tired.  ROS: Patient denies fever, rash, sore throat, blurred vision, nausea, vomiting, diarrhea, cough, shortness of breath or chest pain,   headache, or mood change.    Objective:   No results found. Recent Labs    09/16/18 0518  WBC 6.2  HGB 9.4*  HCT 29.1*  PLT 277   Recent Labs    09/16/18 0518  NA 133*  K 4.1  CL 99  CO2 23  GLUCOSE 100*  BUN 18  CREATININE 1.13  CALCIUM 8.7*    Intake/Output Summary (Last 24 hours) at 09/18/2018 1850 Last data filed at 09/18/2018 1500 Gross per 24 hour  Intake 480 ml  Output 2050 ml  Net -1570 ml     Physical Exam: Vital Signs Blood pressure 137/61, pulse 62, temperature 98.5 F (36.9 C), temperature source Oral, resp. rate 18, height 5\' 11"  (1.803 m), weight 107 kg, SpO2 98 %.   Physical Exam Constitutional:  Awake, alert, appropriate, sitting up in manual w/c in room, with OT; working on standing to get underwear up completely- stood x1-1.5 minute- asked to sit down due to fatigue with OT- keep interrupting when he's getting dressed, NAD   Vital signs and labs reviewed. HEENT: EOMI, oral membranes moist Neck: supple Cardiovascular: bradycardic, regular rhythm    Respiratory: CTA Bilaterally without wheezes or rales.  GI: BS +, non-tender, non-distended  Musculoskeletal:Normal range of motion.  Comments: RLE limbguard in place  NO TTP over L heel/arch or ball of foot on L; only back of heel- due to burst blister Neurological: alert and appropriate. HOH  UE 5/5. RLE: can lift against gravity. LLE: 3/5 HF, 4/5 KE, ADF/PF. No focal sensory findings.  Skin: He isnot diaphoretic. Trace edema of LLE RLE incision intact. Minimal drainage R BKA a little bruising along incision; some dried blood- 2 areas more bruising  medially and laterally- dog ears pulling in some; still bulbar type edema mild L heel on back has foam dressing- per nursing note, burst blister/open pink skin  -Buttocks- has a large purple/brown bruise between cheeks, but no skin breakdown- also has variable coloring of bruising on abdomen from lovenox injections. Psychiatric: pleasant , a little more quiet today    Assessment/Plan: 1. 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  Physiatrist is providing close team supervision and 24 hour management of active medical problems listed below.  Physiatrist and rehab team continue to assess barriers to discharge/monitor patient progress toward functional and medical goals  Care Tool:  Bathing    Body parts bathed by patient: Right arm, Left arm, Chest, Abdomen, Front perineal area, Right upper leg, Left upper leg, Buttocks, Left lower leg   Body parts bathed by helper: Left lower leg Body parts n/a: Left upper leg   Bathing assist Assist Level: Contact Guard/Touching assist     Upper Body Dressing/Undressing Upper body dressing   What is the patient wearing?: Pull over shirt    Upper body assist Assist Level: Set up assist    Lower Body Dressing/Undressing Lower body dressing      What is the patient wearing?: Underwear/pull up, Pants     Lower body  assist Assist for lower body dressing: Minimal Assistance - Patient > 75%     Toileting Toileting    Toileting assist Assist for toileting: Maximal Assistance - Patient 25 - 49%     Transfers Chair/bed transfer  Transfers assist     Chair/bed transfer assist level: Contact Guard/Touching assist Chair/bed transfer assistive device: Sliding board   Locomotion Ambulation   Ambulation assist   Ambulation activity did not occur: Safety/medical  concerns(decreased strength/activity tolerance)          Walk 10 feet activity   Assist  Walk 10 feet activity did not occur: Safety/medical concerns(decreased strength/activity tolerance)        Walk 50 feet activity   Assist Walk 50 feet with 2 turns activity did not occur: Safety/medical concerns(decreased strength/activity tolerance)         Walk 150 feet activity   Assist Walk 150 feet activity did not occur: Safety/medical concerns(decreased strength/activity tolerance)         Walk 10 feet on uneven surface  activity   Assist Walk 10 feet on uneven surfaces activity did not occur: Safety/medical concerns(decreased strength/activity tolerance)         Wheelchair     Assist Will patient use wheelchair at discharge?: Yes Type of Wheelchair: Manual    Wheelchair assist level: Supervision/Verbal cueing Max wheelchair distance: 100'    Wheelchair 50 feet with 2 turns activity    Assist        Assist Level: Supervision/Verbal cueing   Wheelchair 150 feet activity     Assist  CBG (last 3)  Recent Labs    09/18/18 0648 09/18/18 1215 09/18/18 1643  GLUCAP 84 91 216*   Wheelchair 150 feet activity did not occur: Safety/medical concerns(decreased strength/activity tolerance)   Assist Level: Supervision/Verbal cueing   Blood pressure 137/61, pulse 62, temperature 98.5 F (36.9 C), temperature source Oral, resp. rate 18, height 5\' 11"  (1.803 m), weight 107 kg, SpO2 98 %.  Medical Problem List and Plan: 1.Decreased functional mobilitysecondary to right transtibial amputation 08/28/2018 as well as recent right lower extremity revascularization procedure. --Continue CIR therapies including PT, OT  -continue limb guard   8/26- wound VAC removed    8/27- changed dressing to black "sock"- no dressing since no drainage.  9/3- continuing limb guard and black sock 2.  Antithrombotics: -DVT/anticoagulation:Lovenox -antiplatelet therapy: Aspirin 81 mg daily, Plavix 75 mg daily 3. Pain Management:Robaxin andoxycodoneas needed.  8/25- pt denies pain, but didn't say if b/c took meds.  8/26- refusing pain meds  8/28- encouraged pt to take tylenol at least for L heel pain  8/30--denies pain today  9/2- not taking much tylenol for L heel pain.  9/3- requiring tylenol still intermittently- took last night.  4. Mood:Provide emotional support -antipsychotic agents: N/A 5. Neuropsych: This patientiscapable of making decisions on hisown behalf. 6. Skin/Wound Care:continue prevena vac per ortho. He is POD #10---dc soon?  8/26- vac off 7. Fluids/Electrolytes/Nutrition:Routine in and outs with follow-up chemistrieson admit.  8/25 Na slightly better from 130 to 132.  8/27- Na 131- stable  9/1- will recheck  9/3- Na+ 133  8.ABLA.Follow-up CBC  8/25- Drop from 9.4 to 8.9- will recheck Thursday.  8/27- Hb 9.0 9. Hypertension. Cardura 2 mg nightly. Monitor with increased mobility  8/25- BP spike this AM to 160s/66- will monitor  8/30- BP well controlled  10. Diabetes mellitus, hemoglobin A1c 7.0. SSI. Check blood sugars before meals and at bedtime. Lantus insulin 10 units daily. Patient on  Glucotrol 5 mg twice daily prior to admission.  -Resume glucotrol as needed -fair control at present.  8/25- [phone]- fairly well controlled  9/1-- reasonably controlled 11. BPH. Proscar 5 mg daily,Flomax 0.4 mg twice daily. Check PVR 12. Hypothyroidism. Synthroid 13. Constipation. Laxative assistance  bm 8/28 14.Hyperlipidemia. Lipitor  15. L heel pain/blister which is open  8/28- will have pt wear shoe, not sock to give more support and do heel protection if needed- con't to monitor   8/31- will order floating L heel and prevalon boot when in bed to treat blister  9/2- pain better per  pt-not taking much tylenol  9/4- healing and pt is almost resolved 16 Dispo-   8/26- team conference today- therapy wants limb guard off prn- since wound VAC is off, no need.  -set d/c date as 9/8 based on min-max assist with therapies  8/27- IPOC done  9/2- team conference today to f/u on goals  LOS: 11 days A FACE TO FACE EVALUATION WAS PERFORMED  Anisha Starliper 09/18/2018, 6:50 PM

## 2018-09-18 NOTE — Progress Notes (Signed)
Physical Therapy Session Note  Patient Details  Name: DERRALL HICKS MRN: [id number] Date of Birth: April 09, 1927  Today's Date: 09/18/2018 PT Individual Time: 0920-1000 PT Individual Time Calculation (min): 40 min   Short Term Goals: Week 2:  PT Short Term Goal 1 (Week 2): STG=LTG due to ELOS.  Skilled Therapeutic Interventions/Progress Updates:  Pt received in w/c & agreeable to tx, denying c/o pain but reporting feeling "weaker" than yesterday. Provided total assist for donning LLE ted hose for edema management & L tennis shoe for time management. Pt propels w/c room>ortho gym with BUE & supervision. Pt completes car transfer at 25" height to simulate car he will ride home in. Pt is able to complete slide board transfer to car with min assist overall and cuing for anterior weight shifting. Pt requires cuing for scooting back in seat & extra time to place BLE in/out of car. In gym, pt stands at high/low table with mod assist for sit>stand, min assist for standing with BUE/1UE support while engaging in tabletop clothespin activity with task focusing on LLE strengthening and standing tolerance, and standing balance. Pt is able to stand 1 minute + 1 minute with seated rest break in between 2/2 fatigue. Pt propelled w/c gym>room with BUE & supervision. At end of session pt left in w/c with chair alarm donned & all needs in reach.  Therapy Documentation Precautions:  Precautions Precaution Comments: pt with R BKA and splint/shrinker from [employer] Other Brace: splint and shrinker from [employer] Restrictions Weight Bearing Restrictions: Yes RLE Weight Bearing: Non weight bearing    Therapy/Group: Individual Therapy  Waunita Schooner 09/18/2018, 10:03 AM

## 2018-09-19 LAB — GLUCOSE, CAPILLARY
Glucose-Capillary: 100 mg/dL — ABNORMAL HIGH (ref 70–99)
Glucose-Capillary: 126 mg/dL — ABNORMAL HIGH (ref 70–99)
Glucose-Capillary: 145 mg/dL — ABNORMAL HIGH (ref 70–99)
Glucose-Capillary: 134 mg/dL — ABNORMAL HIGH (ref 70–99)

## 2018-09-19 NOTE — Progress Notes (Signed)
[place] PHYSICAL MEDICINE & REHABILITATION PROGRESS NOTE   Subjective/Complaints:  Pt reports no issues- asking when blister will completely heal.   ROS: Patient denies fever, rash, sore throat, blurred vision, nausea, vomiting, diarrhea, cough, shortness of breath or chest pain,   headache, or mood change.    Objective:   No results found. No results for input(s): WBC, HGB, HCT, PLT in the last 72 hours. No results for input(s): NA, K, CL, CO2, GLUCOSE, BUN, CREATININE, CALCIUM in the last 72 hours.  Intake/Output Summary (Last 24 hours) at 09/19/2018 1204 Last data filed at 09/19/2018 0858 Gross per 24 hour  Intake 1080 ml  Output 1275 ml  Net -195 ml     Physical Exam: Vital Signs Blood pressure (!) 146/61, pulse (!) 54, temperature 97.7 F (36.5 C), temperature source Oral, resp. rate 14, height 5\' 11"  (1.803 m), weight 108 kg, SpO2 98 %.   Physical Exam Constitutional:  Awake, alert, appropriate, sitting up in bed, appears comfortable, NAD   Vital signs and labs reviewed. HEENT: EOMI, oral membranes moist Neck: supple Cardiovascular: bradycardic, regular rhythm    Respiratory: CTA Bilaterally without wheezes or rales.  GI: BS +, non-tender, non-distended  Musculoskeletal:Normal range of motion.  Comments: RLE limbguard in place  NO TTP over L heel/arch or ball of foot on L; almost healed blister on back of heel- less than dime sized area Neurological: alert and appropriate. HOH  UE 5/5. RLE: can lift against gravity. LLE: 3/5 HF, 4/5 KE, ADF/PF. No focal sensory findings.  Skin: He isnot diaphoretic. Trace edema of LLE RLE incision intact. Minimal drainage R BKA a little bruising along incision; some dried blood- 2 areas more bruising medially and laterally- dog ears pulling in some; still bulbar type edema mild L heel on back has foam dressing- per nursing note, burst blister/open pink skin  -Buttocks- has a large purple/brown bruise between cheeks, but  no skin breakdown- also has variable coloring of bruising on abdomen from lovenox injections. Psychiatric: pleasant , a little more quiet today    Assessment/Plan: 1. 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  Physiatrist is providing close team supervision and 24 hour management of active medical problems listed below.  Physiatrist and rehab team continue to assess barriers to discharge/monitor patient progress toward functional and medical goals  Care Tool:  Bathing    Body parts bathed by patient: Right arm, Left arm, Chest, Abdomen, Front perineal area, Right upper leg, Left upper leg, Buttocks, Left lower leg   Body parts bathed by helper: Left lower leg Body parts n/a: Left upper leg   Bathing assist Assist Level: Contact Guard/Touching assist     Upper Body Dressing/Undressing Upper body dressing   What is the patient wearing?: Pull over shirt    Upper body assist Assist Level: Set up assist    Lower Body Dressing/Undressing Lower body dressing      What is the patient wearing?: Underwear/pull up, Pants     Lower body assist Assist for lower body dressing: Minimal Assistance - Patient > 75%     Toileting Toileting    Toileting assist Assist for toileting: Maximal Assistance - Patient 25 - 49%     Transfers Chair/bed transfer  Transfers assist     Chair/bed transfer assist level: Contact Guard/Touching assist Chair/bed transfer assistive device:  Sliding board   Locomotion Ambulation   Ambulation assist   Ambulation activity did not occur: Safety/medical concerns(decreased strength/activity tolerance)          Walk 10 feet activity   Assist  Walk 10 feet activity did not occur: Safety/medical concerns(decreased strength/activity tolerance)        Walk 50 feet  activity   Assist Walk 50 feet with 2 turns activity did not occur: Safety/medical concerns(decreased strength/activity tolerance)         Walk 150 feet activity   Assist Walk 150 feet activity did not occur: Safety/medical concerns(decreased strength/activity tolerance)         Walk 10 feet on uneven surface  activity   Assist Walk 10 feet on uneven surfaces activity did not occur: Safety/medical concerns(decreased strength/activity tolerance)         Wheelchair     Assist Will patient use wheelchair at discharge?: Yes Type of Wheelchair: Manual    Wheelchair assist level: Supervision/Verbal cueing Max wheelchair distance: 100'    Wheelchair 50 feet with 2 turns activity    Assist        Assist Level: Supervision/Verbal cueing   Wheelchair 150 feet activity     Assist  CBG (last 3)  Recent Labs    09/18/18 2108 09/19/18 0620 09/19/18 1149  GLUCAP 159* 100* 126*   Wheelchair 150 feet activity did not occur: Safety/medical concerns(decreased strength/activity tolerance)   Assist Level: Supervision/Verbal cueing   Blood pressure (!) 146/61, pulse (!) 54, temperature 97.7 F (36.5 C), temperature source Oral, resp. rate 14, height 5\' 11"  (1.803 m), weight 108 kg, SpO2 98 %.  Medical Problem List and Plan: 1.Decreased functional mobilitysecondary to right transtibial amputation 08/28/2018 as well as recent right lower extremity revascularization procedure. --Continue CIR therapies including PT, OT  -continue limb guard   8/26- wound VAC removed    8/27- changed dressing to black "sock"- no dressing since no drainage.  9/3- continuing limb guard and black sock 2. Antithrombotics: -DVT/anticoagulation:Lovenox -antiplatelet therapy: Aspirin 81 mg daily, Plavix 75 mg daily 3. Pain Management:Robaxin andoxycodoneas needed.  8/25- pt denies pain, but didn't say if b/c took meds.  8/26- refusing  pain meds  8/28- encouraged pt to take tylenol at least for L heel pain  8/30--denies pain today  9/2- not taking much tylenol for L heel pain.  9/3- requiring tylenol still intermittently- took last night.  4. Mood:Provide emotional support -antipsychotic agents: N/A 5. Neuropsych: This patientiscapable of making decisions on hisown behalf. 6. Skin/Wound Care:continue prevena vac per ortho. He is POD #10---dc soon?  8/26- vac off 7. Fluids/Electrolytes/Nutrition:Routine in and outs with follow-up chemistrieson admit.  8/25 Na slightly better from 130 to 132.  8/27- Na 131- stable  9/1- will recheck  9/3- Na+ 133  8.ABLA.Follow-up CBC  8/25- Drop from 9.4 to 8.9- will recheck Thursday.  8/27- Hb 9.0 9. Hypertension. Cardura 2 mg nightly. Monitor with increased mobility  8/25- BP spike this AM to 160s/66- will monitor  8/30- BP well controlled  9/5- BP up to 140s/60s- otherwise well controlled  10. Diabetes mellitus, hemoglobin A1c 7.0. SSI. Check blood sugars before meals and at bedtime. Lantus insulin 10 units daily. Patient on Glucotrol 5 mg twice daily prior to admission.  -Resume glucotrol as needed -fair control at present.  8/25- [id number]- fairly well controlled  9/1-- reasonably controlled 11. BPH. Proscar 5 mg daily,Flomax 0.4 mg twice daily. Check PVR 12. Hypothyroidism. Synthroid 13. Constipation. Laxative assistance  bm 8/28 14.Hyperlipidemia. Lipitor  15. L heel pain/blister which is open  8/28- will have pt wear shoe, not sock to give more support and do heel protection if needed- con't to monitor   8/31- will order floating L heel and prevalon boot when in bed to treat blister  9/2- pain better per pt-not taking much tylenol  9/4- healing and pain is almost resolved  9/5-blister almost healed- granulating well 16 Dispo-   8/26- team conference today- therapy wants limb guard off prn- since wound  VAC is off, no need.  -set d/c date as 9/8 based on min-max assist with therapies  8/27- IPOC done  9/2- team conference today to f/u on goals  LOS: 12 days A FACE TO FACE EVALUATION WAS PERFORMED  Anant Agard 09/19/2018, 12:04 PM

## 2018-09-20 ENCOUNTER — Inpatient Hospital Stay (HOSPITAL_COMMUNITY): Payer: Medicare Other | Admitting: Physical Therapy

## 2018-09-20 ENCOUNTER — Inpatient Hospital Stay (HOSPITAL_COMMUNITY): Payer: Medicare Other

## 2018-09-20 LAB — GLUCOSE, CAPILLARY
Glucose-Capillary: 99 mg/dL (ref 70–99)
Glucose-Capillary: 152 mg/dL — ABNORMAL HIGH (ref 70–99)
Glucose-Capillary: 155 mg/dL — ABNORMAL HIGH (ref 70–99)
Glucose-Capillary: 141 mg/dL — ABNORMAL HIGH (ref 70–99)

## 2018-09-20 NOTE — Discharge Summary (Signed)
Physician Discharge Summary  Patient ID: [name] MRN: [id number] DOB/AGE: Dec 17, 1927 85 y.o.  Admit date: 09/07/2018 Discharge date: 09/22/2018  Discharge Diagnoses:  Principal Problem:   Right below-knee amputee [facility]) Active Problems:   Essential hypertension, benign   Unable to maintain weight-bearing   Type 2 diabetes mellitus with diabetic peripheral angiopathy with gangrene (HCC)   Anemia due to acute blood loss   Stage 3 chronic kidney disease (HCC)   Pain of left heel DVT prophylaxis Constipation Hypothyroidism Left heel blister  Discharged Condition: Stable  Significant Diagnostic Studies: No results found.  Labs:  Basic Metabolic Panel: Recent Labs  Lab 09/16/18 0518  NA 133*  K 4.1  CL 99  CO2 23  GLUCOSE 100*  BUN 18  CREATININE 1.13  CALCIUM 8.7*    CBC: Recent Labs  Lab 09/16/18 0518  WBC 6.2  NEUTROABS 1.7  HGB 9.4*  HCT 29.1*  MCV 98.3  PLT 277    CBG: Recent Labs  Lab 09/19/18 2116 09/20/18 0628 09/20/18 1140 09/20/18 1617 09/20/18 2110  GLUCAP 134* 99 152* 155* 141*   Family history.  Mother with cancer.  Father with CAD as well as CVA.  Brother with CAD.  Denies any hypertension or diabetes mellitus  Brief HPI:   [name] is a 85 y.o. right-handed male with history of diabetes mellitus, hypertension, hyperlipidemia, peripheral vascular disease maintained on aspirin and Plavix with recent revascularization procedure right lower extremity.  Per chart review lives alone completely independent up until 2 weeks prior.  He does have family in the area.  Presented 08/24/2018 with gangrenous right foot at the toe regressed over the past month.  Limb was not felt to be salvageable and underwent right transtibial amputation 08/28/2018 per Dr. Sharol Given application of wound VAC.  Splint and shrinker provided by [employer] prosthetics.  Hospital course pain management as well as subcutaneous Lovenox for DVT prophylaxis.  Acute blood loss anemia  7.6-8.2 he was transfused 2 units of packed red blood cells.  Mild hyponatremia 128 and monitored.  Intermittent bouts of confusion felt to be related to narcotics multifactorial.  Palliative care consult to establish goals of care.  Patient was admitted for a comprehensive rehab program   Hospital Course: [name] was admitted to rehab 09/07/2018 for inpatient therapies to consist of PT, ST and OT at least three hours five days a week. Past admission physiatrist, therapy team and rehab RN have worked together to provide customized collaborative inpatient rehab.  Pertaining to patient right transtibial amputation 08/28/2018 he would follow-up outpatient Dr. Sharol Given wound [facility] had been discontinued.  Changed dressing to black sock.  Limb guard as directed.  Subcutaneous Lovenox for DVT prophylaxis.  No bleeding episodes.  Aspirin and Plavix ongoing for history of peripheral vascular disease.  Pain managed with the use of Robaxin and oxycodone.  Acute blood loss anemia stable latest hemoglobin 9.0.  Blood pressures controlled with Cardura.  Patient follow-up outpatient primary MD.  Hemoglobin A1c of 7 and patient maintained on Lantus insulin low dose while in the hospital he could resume his Glucotrol on discharge and follow-up with PCP.  Voiding without difficulty remained on Proscar as well as Flomax.  Lipitor ongoing for hyperlipidemia.  Left heel blister heel protectors noted and order given for floating left heel and Prevalon boot when in bed to treat blister.   Blood pressures were monitored on TID basis and monitored  Diabetes has been monitored with ac/hs CBG checks and  SSI was use prn for tighter BS control.   He is continent of bowel and bladder.  He has made gains during rehab stay and is attending therapies  He will continue to receive follow up therapies   after discharge  Rehab course: During patient's stay in rehab weekly team conferences were held to monitor patient's progress, set  goals and discuss barriers to discharge. At admission, patient required minimal assist lateral scoot transfers, minimal assist ambulate 2 feet rolling walker, max assist sit to supine.  Moderate assist lower body bathing minimal guard upper body bathing minimal guard upper body dressing total assist lower body dressing  Physical exam.  Blood pressure 143/64 pulse 60 temperature 97.9 respiration 16 oxygen saturation 97% room air Constitutional.  Alert and oriented Head.  Normocephalic and atraumatic Eyes.  Pupils round and reactive to light EOMs normal no nystagmus Neck.  Supple nontender no tracheal deviation Cardiovascular.  Irregular irregular GI.  Soft no distention nontender positive bowel sounds Musculoskeletal normal range of motion right leg initially with wound VAC associated swelling. Neurological.  Alert and oriented a bit hard of hearing makes good eye contact.  Upper extremities 5 out of 5 right lower extremity can lift against antigravity.  Left lower extremity 3 out of 5 hip flexors, 4 out of 5 knee extension, ankle dorsi plantarflexion.  No focal sensory findings.  He  has had improvement in activity tolerance, balance, postural control as well as ability to compensate for deficits. He has had improvement in functional use RUE/LUE  and RLE/LLE as well as improvement in awareness.  Working with energy conservation techniques.  Sessions focused on hands-on transfer training with daughter.  Patient transferred to Ortho gym for ongoing therapies.  Daughter required minimal verbal cues for set up and provided minimal assist transferring in and out of vehicle but demonstrated good safety awareness.  Patient and daughter performed additional sliding board transfers in and out of bed providing supervision.  Activities daily living sliding board transfers, sit to stand, standing balance, basic ADL training education ongoing required minimal assist for sliding board transfers to wheelchair.  Full  family teaching completed plan discharge to home       Disposition: Discharge to home   Diet: Diabetic diet  Special Instructions: No driving smoking or alcohol.  Prevalon boot left lower extremity when in bed  Medications at discharge. 1.  Tylenol as needed 2.  Lipitor 40 mg p.o. daily 3.  Tums 200 mg as needed 4.  Plavix 75 mg p.o. daily 5.  Cosopt eyedrops 1 drop both eyes twice daily 6.  Cardura 2 mg p.o. nightly 7.  Pepcid 20 mg p.o. twice daily 8.  Proscar 5 mg p.o. daily 9.  Glucotrol 5 mg twice daily 10.  Synthroid 50 mcg p.o. daily 11.  Robaxin 500 mg p.o. every 6 hours as needed muscle spasms 11.  Oxycodone 1 tablet every 8 hours as needed pain 12.  MiraLAX daily hold for loose stools 13.  Vitamin B6 100 mg p.o. daily 14.  Flomax 0.4 mg p.o. twice daily 15.  Vitamin E 400 units p.o. daily 16.  Aspirin 81 mg daily  Discharge Instructions    Ambulatory referral to Physical Medicine Rehab   Complete by: As directed    Moderate complexity follow-up 1 to 2 weeks right BKA      Follow-up Information    Lovorn, Jinny Blossom, MD Follow up.   Specialty: Physical Medicine and Rehabilitation Why: Office to call for appointment Contact  information: [address] [place] [address] [phone]        [name], MD Follow up.   Specialty: Orthopedic Surgery Why: Call for appointment Contact information: [place] [address] [phone]           Signed: Cathlyn Parsons 09/21/2018, 5:31 AM

## 2018-09-20 NOTE — Progress Notes (Signed)
Physical Therapy Session Note  Patient Details  Name: [name] MRN: [id number] Date of Birth: [birth year]  Today's Date: 09/20/2018 PT Individual Time: 2060-1561 PT Individual Time Calculation (min): 39 min   Short Term Goals: Week 2:  PT Short Term Goal 1 (Week 2): STG=LTG due to ELOS.  Skilled Therapeutic Interventions/Progress Updates: Pt presented in w/c with dgt present eating lunch. PTA agreeable to return 10 min later. Upon return arrival pt agreeable to therapy. Session focused on hands on transfer training with dgt [name]. Pt transferred to ortho gym for additional practice transfer training at 24in height. Dgt required min verbal cues for set up and provided minA transferring in/out of vehicle but demonstrated good safety awareness and was able to provide appropriate cues. Pt and dgt performed additional SB transfer in/out of bed with PTA provided supervision and x2 verbal cues to have pt complete transfer onto bed ( sitting EOB) prior to transferred to supine. [name] was able to provide appropriate cues and was able to manage w/c parts with supervision and increased time. Pt returned to w/c with dgt performing transfer with improved technique and pt left in w/c at end of session with call bell within reach and dgt present.      Therapy Documentation Precautions:  Precautions Precaution Comments: pt with R BKA and splint/shrinker from [employer] Other Brace: splint and shrinker from [employer] Restrictions Weight Bearing Restrictions: Yes RLE Weight Bearing: Non weight bearing General:   Vital Signs: Therapy Vitals Temp: 99.6 F (37.6 C) Temp Source: Oral Pulse Rate: (!) 52 Resp: 16 BP: 130/63 Patient Position (if appropriate): Sitting Oxygen Therapy SpO2: 100 % Other Treatments:      Therapy/Group: Individual Therapy  Mintie Witherington  Krystian Ferrentino, PTA  09/20/2018, 3:54 PM

## 2018-09-20 NOTE — Progress Notes (Addendum)
Physical Therapy Session Note  Patient Details  Name: [name] MRN: [id number] Date of Birth: 1927/05/10  Today's Date: 09/20/2018 PT Individual Time: 1030-1100 PT Individual Time Calculation (min): 30 min   Short Term Goals: Week 2:  PT Short Term Goal 1 (Week 2): STG=LTG due to ELOS.  Skilled Therapeutic Interventions/Progress Updates:   Pt resting in bed, with R limb protector already donned.Heide Spark here for family ed. Pt donned shorts in bed with min assist from dtr.  Using bed features pt sat up with supervision and extra time. (He wiil have a hospital bed at home, per dtr.)  Dtr observed PT assisting pt with basic transfer;  set up and CGA for slide board to level wc.  Simulated car transfer to 24" ht seat, per dtr's car, with dtr assisting pt .  Mod cues for sequencing and safety and min assist overall, with slide board.  PT instructed dtr in w/c parts mgt, folding/unfolding w/c; she return demonstrated w/c legrests mgt and voiced understanding of folding/unfolding/lifting wc.    At end of session, pt resting in w/c with needs at hand and dtr present.  She plans to stay for his 1:00 PT appt today for further ed.  She would benefit from more practice with car transfer, bed mobility, basic transfers and donning/doffing limb protector.     Therapy Documentation Precautions:  Precautions Precaution Comments: pt with R BKA and splint/shrinker from [employer] Other Brace: splint and shrinker from [employer] Restrictions Weight Bearing Restrictions: Yes RLE Weight Bearing: Non weight bearing  Pain: pt denies       Therapy/Group: Individual Therapy  Geoffery Aultman 09/20/2018, 12:19 PM

## 2018-09-20 NOTE — Progress Notes (Signed)
[place] PHYSICAL MEDICINE & REHABILITATION PROGRESS NOTE   Subjective/Complaints:  Pt reports no issues-- finished entire breakfast, even the oatmeal.   ROS: Patient denies fever, rash, sore throat, blurred vision, nausea, vomiting, diarrhea, cough, shortness of breath or chest pain,   headache, or mood change.    Objective:   No results found. No results for input(s): WBC, HGB, HCT, PLT in the last 72 hours. No results for input(s): NA, K, CL, CO2, GLUCOSE, BUN, CREATININE, CALCIUM in the last 72 hours.  Intake/Output Summary (Last 24 hours) at 09/20/2018 0936 Last data filed at 09/20/2018 0527 Gross per 24 hour  Intake 399 ml  Output 1350 ml  Net -951 ml     Physical Exam: Vital Signs Blood pressure (!) 153/66, pulse (!) 52, temperature 99.6 F (37.6 C), temperature source Oral, resp. rate 18, height 5\' 11"  (1.803 m), weight 109 kg, SpO2 97 %.   Physical Exam Constitutional:  Awake, alert, appropriate, sitting up in bed, appears comfortable, completely cleaned breakfast plate, NAD   Vital signs and labs reviewed. HEENT: EOMI, oral membranes moist Neck: supple Cardiovascular: bradycardic, regular rhythm    Respiratory: CTA Bilaterally without wheezes or rales.  GI: BS +, non-tender, non-distended  Musculoskeletal:Normal range of motion.  Comments: RLE limbguard in place  NO TTP over L heel/arch or ball of foot on L; almost healed blister on back of heel- less than dime sized area Neurological: alert and appropriate. HOH  UE 5/5. RLE: can lift against gravity. LLE: 3/5 HF, 4/5 KE, ADF/PF. No focal sensory findings.  Skin: He isnot diaphoretic. Trace edema of LLE RLE incision intact. Minimal drainage R BKA a little bruising along incision; some dried blood- 2 areas more bruising medially and laterally- dog ears pulling in some; still bulbar type edema mild L heel on back has foam dressing- per nursing note, burst blister/open pink skin  -Buttocks- has a large  purple/brown bruise between cheeks, but no skin breakdown- also has variable coloring of bruising on abdomen from lovenox injections. Psychiatric: pleasant    Assessment/Plan: 1. 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  Physiatrist is providing close team supervision and 24 hour management of active medical problems listed below.  Physiatrist and rehab team continue to assess barriers to discharge/monitor patient progress toward functional and medical goals  Care Tool:  Bathing    Body parts bathed by patient: Right arm, Left arm, Chest, Abdomen, Front perineal area, Right upper leg, Left upper leg, Buttocks, Left lower leg   Body parts bathed by helper: Left lower leg Body parts n/a: Left upper leg   Bathing assist Assist Level: Contact Guard/Touching assist     Upper Body Dressing/Undressing Upper body dressing   What is the patient wearing?: Pull over shirt    Upper body assist Assist Level: Set up assist    Lower Body Dressing/Undressing Lower body dressing      What is the patient wearing?: Underwear/pull up, Pants     Lower body assist Assist for lower body dressing: Minimal Assistance - Patient > 75%     Toileting Toileting    Toileting assist Assist for toileting: Maximal Assistance - Patient 25 - 49%     Transfers Chair/bed transfer  Transfers assist     Chair/bed transfer assist level: Contact Guard/Touching assist Chair/bed transfer assistive device: Sliding board  Locomotion Ambulation   Ambulation assist   Ambulation activity did not occur: Safety/medical concerns(decreased strength/activity tolerance)          Walk 10 feet activity   Assist  Walk 10 feet activity did not occur: Safety/medical concerns(decreased strength/activity tolerance)         Walk 50 feet activity   Assist Walk 50 feet with 2 turns activity did not occur: Safety/medical concerns(decreased strength/activity tolerance)         Walk 150 feet activity   Assist Walk 150 feet activity did not occur: Safety/medical concerns(decreased strength/activity tolerance)         Walk 10 feet on uneven surface  activity   Assist Walk 10 feet on uneven surfaces activity did not occur: Safety/medical concerns(decreased strength/activity tolerance)         Wheelchair     Assist Will patient use wheelchair at discharge?: Yes Type of Wheelchair: Manual    Wheelchair assist level: Supervision/Verbal cueing Max wheelchair distance: 100'    Wheelchair 50 feet with 2 turns activity    Assist        Assist Level: Supervision/Verbal cueing   Wheelchair 150 feet activity     Assist  CBG (last 3)  Recent Labs    09/19/18 1625 09/19/18 2116 09/20/18 0628  GLUCAP 145* 134* 99   Wheelchair 150 feet activity did not occur: Safety/medical concerns(decreased strength/activity tolerance)   Assist Level: Supervision/Verbal cueing   Blood pressure (!) 153/66, pulse (!) 52, temperature 99.6 F (37.6 C), temperature source Oral, resp. rate 18, height 5\' 11"  (1.803 m), weight 109 kg, SpO2 97 %.  Medical Problem List and Plan: 1.Decreased functional mobilitysecondary to right transtibial amputation 08/28/2018 as well as recent right lower extremity revascularization procedure. --Continue CIR therapies including PT, OT  -continue limb guard   8/26- wound VAC removed    8/27- changed dressing to black "sock"- no dressing since no drainage.  9/3- continuing limb guard and black sock 2. Antithrombotics: -DVT/anticoagulation:Lovenox -antiplatelet therapy: Aspirin 81 mg daily, Plavix 75 mg daily 3. Pain Management:Robaxin andoxycodoneas needed.  8/25- pt denies pain, but didn't say if b/c took  meds.  8/26- refusing pain meds  8/28- encouraged pt to take tylenol at least for L heel pain  8/30--denies pain today  9/2- not taking much tylenol for L heel pain.  9/3- requiring tylenol still intermittently- took last night.  4. Mood:Provide emotional support -antipsychotic agents: N/A 5. Neuropsych: This patientiscapable of making decisions on hisown behalf. 6. Skin/Wound Care:continue prevena vac per ortho. He is POD #10---dc soon?  8/26- vac off 7. Fluids/Electrolytes/Nutrition:Routine in and outs with follow-up chemistrieson admit.  8/25 Na slightly better from 130 to 132.  8/27- Na 131- stable  9/1- will recheck  9/3- Na+ 133  8.ABLA.Follow-up CBC  8/25- Drop from 9.4 to 8.9- will recheck Thursday.  8/27- Hb 9.0 9. Hypertension. Cardura 2 mg nightly. Monitor with increased mobility  8/25- BP spike this AM to 160s/66- will monitor  8/30- BP well controlled  9/5- BP up to 140s/60s- otherwise well controlled  10. Diabetes mellitus, hemoglobin A1c 7.0. SSI. Check blood sugars before meals and at bedtime. Lantus insulin 10 units daily. Patient on Glucotrol 5 mg twice daily prior to admission.  -Resume glucotrol as needed -fair control at present.  8/25- 536-644- fairly well controlled  9/1-- reasonably controlled 11. BPH. Proscar 5 mg daily,Flomax 0.4 mg twice daily. Check PVR 12. Hypothyroidism. Synthroid 13. Constipation. Laxative assistance  bm 8/28 14.Hyperlipidemia. Lipitor  15. L heel pain/blister which is open  8/28- will have pt wear shoe, not sock to give more support and do heel protection if needed- con't to monitor   8/31- will order floating L heel and prevalon boot when in bed to treat blister  9/2- pain better per pt-not taking much tylenol  9/4- healing and pain is almost resolved  9/5-blister almost healed- granulating well 16 Dispo-   8/26- team conference today- therapy wants limb guard  off prn- since wound VAC is off, no need.  -set d/c date as 9/8 based on min-max assist with therapies  8/27- IPOC done  9/2- team conference today to f/u on goals  9/6- d/c Tuesday  LOS: 13 days A FACE TO FACE EVALUATION WAS PERFORMED  [name] 09/20/2018, 9:36 AM

## 2018-09-21 ENCOUNTER — Inpatient Hospital Stay (HOSPITAL_COMMUNITY): Payer: Medicare Other

## 2018-09-21 DIAGNOSIS — I10 Essential (primary) hypertension: Secondary | ICD-10-CM

## 2018-09-21 DIAGNOSIS — G8918 Other acute postprocedural pain: Secondary | ICD-10-CM

## 2018-09-21 DIAGNOSIS — E871 Hypo-osmolality and hyponatremia: Secondary | ICD-10-CM

## 2018-09-21 LAB — BASIC METABOLIC PANEL
Anion gap: 8 (ref 5–15)
BUN: 21 mg/dL (ref 8–23)
CO2: 25 mmol/L (ref 22–32)
Calcium: 8.6 mg/dL — ABNORMAL LOW (ref 8.9–10.3)
Chloride: 99 mmol/L (ref 98–111)
Creatinine, Ser: 1.18 mg/dL (ref 0.61–1.24)
GFR calc Af Amer: >60 mL/min (ref >60)
GFR calc non Af Amer: 54 mL/min — ABNORMAL LOW (ref >60)
Glucose, Bld: 124 mg/dL — ABNORMAL HIGH (ref 70–99)
Potassium: 4 mmol/L (ref 3.5–5.1)
Sodium: 132 mmol/L — ABNORMAL LOW (ref 135–145)

## 2018-09-21 LAB — CBC WITH DIFFERENTIAL/PLATELET
Abs Immature Granulocytes: 0.02 10*3/uL (ref 0.00–0.07)
Basophils Absolute: 0.1 10*3/uL (ref 0.0–0.1)
Basophils Relative: 1 %
Eosinophils Absolute: 0.4 10*3/uL (ref 0.0–0.5)
Eosinophils Relative: 5 %
HCT: 29.8 % — ABNORMAL LOW (ref 39.0–52.0)
Hemoglobin: 9.6 g/dL — ABNORMAL LOW (ref 13.0–17.0)
Immature Granulocytes: 0 %
Lymphocytes Relative: 44 %
Lymphs Abs: 3.8 10*3/uL (ref 0.7–4.0)
MCH: 31.5 pg (ref 26.0–34.0)
MCHC: 32.2 g/dL (ref 30.0–36.0)
MCV: 97.7 fL (ref 80.0–100.0)
Monocytes Absolute: 0.8 10*3/uL (ref 0.1–1.0)
Monocytes Relative: 9 %
Neutro Abs: 3.4 10*3/uL (ref 1.7–7.7)
Neutrophils Relative %: 41 %
Platelets: 239 10*3/uL (ref 150–400)
RBC: 3.05 MIL/uL — ABNORMAL LOW (ref 4.22–5.81)
RDW: 17.4 % — ABNORMAL HIGH (ref 11.5–15.5)
WBC: 8.5 10*3/uL (ref 4.0–10.5)
nRBC: 0 % (ref 0.0–0.2)

## 2018-09-21 LAB — GLUCOSE, CAPILLARY
Glucose-Capillary: 114 mg/dL — ABNORMAL HIGH (ref 70–99)
Glucose-Capillary: 139 mg/dL — ABNORMAL HIGH (ref 70–99)
Glucose-Capillary: 208 mg/dL — ABNORMAL HIGH (ref 70–99)
Glucose-Capillary: 160 mg/dL — ABNORMAL HIGH (ref 70–99)

## 2018-09-21 MED ORDER — ASPIRIN EC 81 MG PO TBEC
81.0000 mg | DELAYED_RELEASE_TABLET | Freq: Every day | ORAL | Status: DC
  Administered 2018-09-21 – 2018-09-22 (×2): 81 mg via ORAL
  Filled 2018-09-21 (×2): qty 1

## 2018-09-21 NOTE — Progress Notes (Signed)
Physical Therapy Session Note  Patient Details  Name: [name] MRN: [id number] Date of Birth: Jan 18, 1927  Today's Date: 09/21/2018 PT Individual Time: 1300-1400 PT Individual Time Calculation (min): 60 min   Short Term Goals:  Week 2:  PT Short Term Goal 1 (Week 2): STG=LTG due to ELOS.     Skilled Therapeutic Interventions/Progress Updates:  Pt seated in w/c.  Son [name] here for family ed.  [name] return -demonstrated donning and doffing limb protector, and simulated car transfer to 24" high seat using slide board, min assist, and drop arm commode transfer with slide board, min assist .  Sitting on BSCm pt pulled shorts down past hips with superivsion, but needed max assist for pulling shorts back up, due to fatigue and difficulty moving shorts past front bar of BSC.  [name] also return-demonstrated w/c parts mgt and folding w/c.  [name] reported that there is a 4" threshold to enter house from porch landing.  He return-demonstrated bumping w/c up/down curb/step including managing anti-tippers of w/c.  At end of session, pt in w/c with seat belt alarm set and son in room.     Therapy Documentation Precautions:  Precautions Precautions: Fall Precaution Comments: pt with R BKA and splint/shrinker from [facility] Other Brace: splint and shrinker from [employer] Restrictions Weight Bearing Restrictions: Yes RLE Weight Bearing: Non weight bearing   Pain: pt denies    Su       Therapy/Group: Individual Therapy  Makenzee Choudhry 09/21/2018, 4:20 PM

## 2018-09-21 NOTE — Progress Notes (Signed)
Occupational Therapy Session Note  Patient Details  Name: [name] MRN: [id number] Date of Birth: 02-Feb-1927  Today's Date: 09/21/2018 OT Individual Time: 0700-0810 OT Individual Time Calculation (min): 70 min    Short Term Goals: Week 2:  OT Short Term Goal 1 (Week 2): Pt will be able to sit to stand with min A. OT Short Term Goal 2 (Week 2): Pt will be able to don pants over legs with min A. OT Short Term Goal 3 (Week 2): Pt will be able to pull pants up after toileting with mod A. OT Short Term Goal 4 (Week 2): Pt will complete squat pivot transfer to drop arm BSC with min A OT Short Term Goal 5 (Week 2): Pt will complete bathing tasks with min A  Skilled Therapeutic Interventions/Progress Updates:    OT intervention with focus on bed mobility, sliding board tranfsers, bathing/dressing w/c level at sink, discharge planning, activity tolerance, and safety awareness to prepare for discharge tomorrow.  Discussed importance of always having cloth barrier between his leg/buttocks and sliding board.  Sliding board transfer with CGA to w/c.  Pt completed bathing/dressing tasks with sit<>stand from w/c at sink.  Pt stated he didn't rest well previous night and exhibited difficulty with sit<>stand this morning.  Pt performed lateral leans for doffing pants and was able to boost in w/c to facilitate therapist pulling pants over hips after pt unable to pull over hips with lateral leans in w/c.  Again, recommended to pt that bathing/dressing at home be at EOB with wash basin.  Pt verbalized understanding.  Pt pleased with progress but frustrated that his recovery is taking longer then he anticipated.  Explained to pt that he will continue to get home health therapy after discharge.  Pt remained in w/c with all needs withn reach and belt alarm activated.  Therapy Documentation Precautions:  Precautions Precaution Comments: pt with R BKA and splint/shrinker from [employer] Other Brace: splint and  shrinker from [employer] Restrictions Weight Bearing Restrictions: Yes RLE Weight Bearing: Non weight bearing  Pain:  Pt states his heel feels better   Therapy/Group: Individual Therapy  [name] 09/21/2018, 8:17 AM

## 2018-09-21 NOTE — Progress Notes (Signed)
[place] PHYSICAL MEDICINE & REHABILITATION PROGRESS NOTE   Subjective/Complaints: Patient seen sitting up in his chair this morning.  He states he did not sleep well overnight because he is eager to go home.  He states he has good weekend.  ROS: Denies CP, SOB, N/V/D  Objective:   No results found. Recent Labs    09/21/18 0525  WBC 8.5  HGB 9.6*  HCT 29.8*  PLT 239   Recent Labs    09/21/18 0525  NA 132*  K 4.0  CL 99  CO2 25  GLUCOSE 124*  BUN 21  CREATININE 1.18  CALCIUM 8.6*    Intake/Output Summary (Last 24 hours) at 09/21/2018 0913 Last data filed at 09/21/2018 0500 Gross per 24 hour  Intake 753 ml  Output 975 ml  Net -222 ml     Physical Exam: Vital Signs Blood pressure (!) 146/63, pulse 62, temperature 98.5 F (36.9 C), temperature source Oral, resp. rate 14, height 5\' 11"  (1.803 m), weight 107 kg, SpO2 97 %.   Physical Exam Constitutional: No distress . Vital signs reviewed. HENT: Normocephalic.  Atraumatic. Eyes: EOMI. No discharge. Cardiovascular: No JVD. Respiratory: Normal effort.  No stridor. GI: Non-distended. Skin: Warm and dry.  Intact. Psych: Normal mood.  Normal behavior. Musc: Right lower extremity with edema and tenderness, improving Neurological: Alert HOH Motor: Bilateral upper extremities: 5/5 proximal distal Right lower extremity: Hip flexion 3-/5 (some pain inhibition) Left lower extremity: 4+/5 proximal to distal Skin: BKA with dressing C/C/I Psychiatric: Normal mood.  Normal affect.  Assessment/Plan: 1. 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  Physiatrist is providing close team supervision and 24 hour management of active medical problems listed below.  Physiatrist and rehab team continue to assess barriers to discharge/monitor patient  progress toward functional and medical goals  Care Tool:  Bathing    Body parts bathed by patient: Right arm, Left arm, Chest, Abdomen, Front perineal area, Right upper leg, Left upper leg, Buttocks, Left lower leg, Face   Body parts bathed by helper: Left lower leg Body parts n/a: Right lower leg   Bathing assist Assist Level: Contact Guard/Touching assist     Upper Body Dressing/Undressing Upper body dressing   What is the patient wearing?: Pull over shirt    Upper body assist Assist Level: Independent    Lower Body Dressing/Undressing Lower body dressing      What is the patient wearing?: Underwear/pull up, Pants     Lower body assist Assist for lower body dressing: Contact Guard/Touching assist     Toileting Toileting    Toileting assist Assist for toileting: Contact Guard/Touching assist     Transfers Chair/bed transfer  Transfers assist     Chair/bed transfer assist level: Contact Guard/Touching assist Chair/bed transfer assistive device: Sliding board   Locomotion Ambulation   Ambulation assist   Ambulation activity did not occur: Safety/medical concerns(decreased strength/activity tolerance)          Walk 10 feet activity   Assist  Walk 10 feet activity did not occur: Safety/medical concerns(decreased strength/activity tolerance)        Walk 50 feet activity   Assist Walk 50 feet with 2 turns activity did not occur: Safety/medical concerns(decreased strength/activity tolerance)         Walk 150 feet activity   Assist Walk 150 feet activity  did not occur: Safety/medical concerns(decreased strength/activity tolerance)         Walk 10 feet on uneven surface  activity   Assist Walk 10 feet on uneven surfaces activity did not occur: Safety/medical concerns(decreased strength/activity tolerance)         Wheelchair     Assist Will patient use wheelchair at discharge?: Yes Type of Wheelchair: Manual    Wheelchair  assist level: Supervision/Verbal cueing Max wheelchair distance: 100'    Wheelchair 50 feet with 2 turns activity    Assist        Assist Level: Supervision/Verbal cueing   Wheelchair 150 feet activity     Assist  CBG (last 3)  Recent Labs    09/20/18 1617 09/20/18 2110 09/21/18 0629  GLUCAP 155* 141* 114*   Wheelchair 150 feet activity did not occur: Safety/medical concerns(decreased strength/activity tolerance)   Assist Level: Supervision/Verbal cueing   Blood pressure (!) 146/63, pulse 62, temperature 98.5 F (36.9 C), temperature source Oral, resp. rate 14, height 5\' 11"  (1.803 m), weight 107 kg, SpO2 97 %.  Medical Problem List and Plan: 1.Decreased functional mobilitysecondary to right transtibial amputation 08/28/2018 as well as recent right lower extremity revascularization procedure. Continue CIR, plan for discharge tomorrow -continue limb guard 2. Antithrombotics: -DVT/anticoagulation:Lovenox -antiplatelet therapy: Aspirin 81 mg daily, Plavix 75 mg daily 3. Pain Management:Robaxin andoxycodoneas needed.  Controlled on 9/7  4. Mood:Provide emotional support -antipsychotic agents: N/A 5. Neuropsych: This patientiscapable of making decisions on hisown behalf. 6. Skin/Wound Care:  Continue dressing changes 7. Fluids/Electrolytes/Nutrition:Routine in and outs 8. ABLA.Follow-up CBC  Hemoglobin 9.6 on 9/7  Continue to monitor 9. Hypertension. Cardura 2 mg nightly. Monitor with increased mobility  Slightly labile 9/7 10. Diabetes mellitus, hemoglobin A1c 7.0. SSI. Check blood sugars before meals and at bedtime. Lantus insulin 10 units daily. Patient on Glucotrol 5 mg twice daily prior to admission.  -Resume glucotrol as needed -fair control at present.  Slightly labile on 9/7 11. BPH. Proscar 5 mg daily,Flomax 0.4 mg twice daily.  12. Hypothyroidism.  Synthroid 13. Constipation. Laxative assistance  bm 8/28 14.Hyperlipidemia. Lipitor 15. L heel pain/blister   Improving 16.  Hyponatremia  Sodium 132 on 9/7, stable  Continue to follow labs as outpatient  LOS: 14 days A FACE TO FACE EVALUATION WAS PERFORMED  Ankit Lorie Phenix 09/21/2018, 9:13 AM

## 2018-09-22 LAB — GLUCOSE, CAPILLARY: Glucose-Capillary: 116 mg/dL — ABNORMAL HIGH (ref 70–99)

## 2018-09-22 MED ORDER — METHOCARBAMOL 500 MG PO TABS: 500.0000 mg | ORAL_TABLET | Freq: Four times a day (QID) | ORAL | 0 refills | Status: DC | PRN

## 2018-09-22 MED ORDER — ATORVASTATIN CALCIUM 40 MG PO TABS: 40.0000 mg | ORAL_TABLET | Freq: Every day | ORAL | 3 refills | Status: DC

## 2018-09-22 MED ORDER — FAMOTIDINE 20 MG PO TABS: 20.0000 mg | ORAL_TABLET | Freq: Two times a day (BID) | ORAL | 0 refills | Status: DC

## 2018-09-22 MED ORDER — OXYCODONE-ACETAMINOPHEN 5-325 MG PO TABS: 1.0000 | ORAL_TABLET | Freq: Three times a day (TID) | ORAL | 0 refills | Status: DC | PRN

## 2018-09-22 MED ORDER — DOXAZOSIN MESYLATE 2 MG PO TABS: 2.0000 mg | ORAL_TABLET | Freq: Every day | ORAL | 3 refills | Status: DC

## 2018-09-22 MED ORDER — TAMSULOSIN HCL 0.4 MG PO CAPS: 0.4000 mg | ORAL_CAPSULE | Freq: Two times a day (BID) | ORAL | 0 refills | Status: DC

## 2018-09-22 MED ORDER — CLOPIDOGREL BISULFATE 75 MG PO TABS: 75.0000 mg | ORAL_TABLET | Freq: Every day | ORAL | 3 refills | Status: DC

## 2018-09-22 MED ORDER — FINASTERIDE 5 MG PO TABS: 5.0000 mg | ORAL_TABLET | Freq: Every evening | ORAL | 0 refills | Status: DC

## 2018-09-22 MED ORDER — ACETAMINOPHEN 325 MG PO TABS: 325.0000 mg | ORAL_TABLET | Freq: Four times a day (QID) | ORAL | Status: DC | PRN

## 2018-09-22 MED ORDER — LEVOTHYROXINE SODIUM 50 MCG PO TABS: 50.0000 ug | ORAL_TABLET | Freq: Every day | ORAL | 0 refills | Status: DC

## 2018-09-22 NOTE — Plan of Care (Signed)
  Problem: RH SKIN INTEGRITY Goal: RH STG SKIN FREE OF INFECTION/BREAKDOWN Description: Patient will not display any s/sx of infection.  Outcome: Completed/Met Goal: RH STG MAINTAIN SKIN INTEGRITY WITH ASSISTANCE Description: STG Maintain Skin Integrity With Minimal Assistance. Outcome: Completed/Met Goal: RH STG ABLE TO PERFORM INCISION/WOUND CARE W/ASSISTANCE Description: STG Able To Perform Incision/Wound Care With minimal Assistance. Outcome: Completed/Met   Problem: RH SAFETY Goal: RH STG ADHERE TO SAFETY PRECAUTIONS W/ASSISTANCE/DEVICE Description: STG Adhere to Safety Precautions With minimal Assistance/Device. Outcome: Completed/Met Goal: RH STG DECREASED RISK OF FALL WITH ASSISTANCE Description: STG Decreased Risk of Fall With minimal Assistance. Outcome: Completed/Met   Problem: RH PAIN MANAGEMENT Goal: RH STG PAIN MANAGED AT OR BELOW PT'S PAIN GOAL Description: Pain will be maintained at or below 4/10.  Outcome: Completed/Met   Problem: RH Leisure Awareness Goal: LTG: Patient will participate in leisure activities (TR) Description: LTG: Patient will participate in leisure activities (simple/moderate/difficult) to increase ability to functionally perform activity, identify and utilize resources, identify new leisure interests, utilize adaptive equipment at specific level  (TR) Outcome: Completed/Met

## 2018-09-22 NOTE — Plan of Care (Signed)
  Problem: RH SKIN INTEGRITY Goal: RH STG SKIN FREE OF INFECTION/BREAKDOWN Description: Patient will not display any s/sx of infection.  Outcome: Completed/Met Goal: RH STG MAINTAIN SKIN INTEGRITY WITH ASSISTANCE Description: STG Maintain Skin Integrity With Minimal Assistance. Outcome: Completed/Met Goal: RH STG ABLE TO PERFORM INCISION/WOUND CARE W/ASSISTANCE Description: STG Able To Perform Incision/Wound Care With minimal Assistance. Outcome: Completed/Met   Problem: RH SAFETY Goal: RH STG ADHERE TO SAFETY PRECAUTIONS W/ASSISTANCE/DEVICE Description: STG Adhere to Safety Precautions With minimal Assistance/Device. Outcome: Completed/Met Goal: RH STG DECREASED RISK OF FALL WITH ASSISTANCE Description: STG Decreased Risk of Fall With minimal Assistance. Outcome: Completed/Met   Problem: RH PAIN MANAGEMENT Goal: RH STG PAIN MANAGED AT OR BELOW PT'S PAIN GOAL Description: Pain will be maintained at or below 4/10.  Outcome: Completed/Met

## 2018-09-22 NOTE — Progress Notes (Signed)
Patient received all the discharged instructions from PA before he went home with his family.

## 2018-09-22 NOTE — Progress Notes (Signed)
Social Work Discharge Note   The overall goal for the admission was met for:   Discharge location: Yes-HOME WITH CHILDREN WHO WILL BE STAYING AND PROVIDING CARE TO PT  Length of Stay: Yes-15 DAYS  Discharge activity level: Yes-CGA ASSIST WHEELCHAIR LEVEL  Home/community participation: Yes  Services provided included: MD, RD, PT, OT, RN, CM, Pharmacy, Neuropsych and SW  Financial Services: Private Insurance: [facility]  Follow-up services arranged: Home Health: [facility]-PT,OT,RN, DME: [facility]-30 TRANSFER BOARD, [facility] [place] and Patient/Family has no preference for HH/DME agencies  Comments (or additional information):SON AND DAUGHTER [place].  Patient/Family verbalized understanding of follow-up arrangements: Yes  Individual responsible for coordination of the follow-up plan: [name]-SON AND [name]-DAUGHTER  Confirmed correct DME delivered: Elease Hashimoto 09/22/2018    Elease Hashimoto

## 2018-09-23 DIAGNOSIS — Z89511 Acquired absence of right leg below knee: Secondary | ICD-10-CM | POA: Diagnosis not present

## 2018-09-23 DIAGNOSIS — Z4781 Encounter for orthopedic aftercare following surgical amputation: Secondary | ICD-10-CM | POA: Diagnosis not present

## 2018-09-23 DIAGNOSIS — E1122 Type 2 diabetes mellitus with diabetic chronic kidney disease: Secondary | ICD-10-CM | POA: Diagnosis not present

## 2018-09-23 DIAGNOSIS — M79672 Pain in left foot: Secondary | ICD-10-CM | POA: Diagnosis not present

## 2018-09-23 DIAGNOSIS — E785 Hyperlipidemia, unspecified: Secondary | ICD-10-CM | POA: Diagnosis not present

## 2018-09-23 DIAGNOSIS — E1151 Type 2 diabetes mellitus with diabetic peripheral angiopathy without gangrene: Secondary | ICD-10-CM | POA: Diagnosis not present

## 2018-09-23 DIAGNOSIS — E039 Hypothyroidism, unspecified: Secondary | ICD-10-CM | POA: Diagnosis not present

## 2018-09-23 DIAGNOSIS — I509 Heart failure, unspecified: Secondary | ICD-10-CM | POA: Diagnosis not present

## 2018-09-23 DIAGNOSIS — I130 Hypertensive heart and chronic kidney disease with heart failure and stage 1 through stage 4 chronic kidney disease, or unspecified chronic kidney disease: Secondary | ICD-10-CM | POA: Diagnosis not present

## 2018-09-23 DIAGNOSIS — M1990 Unspecified osteoarthritis, unspecified site: Secondary | ICD-10-CM | POA: Diagnosis not present

## 2018-09-23 DIAGNOSIS — G4730 Sleep apnea, unspecified: Secondary | ICD-10-CM | POA: Diagnosis not present

## 2018-09-23 DIAGNOSIS — N183 Chronic kidney disease, stage 3 (moderate): Secondary | ICD-10-CM | POA: Diagnosis not present

## 2018-09-23 DIAGNOSIS — Z4801 Encounter for change or removal of surgical wound dressing: Secondary | ICD-10-CM | POA: Diagnosis not present

## 2018-09-23 DIAGNOSIS — H409 Unspecified glaucoma: Secondary | ICD-10-CM | POA: Diagnosis not present

## 2018-09-25 ENCOUNTER — Telehealth: Payer: Self-pay

## 2018-09-25 NOTE — Telephone Encounter (Signed)
Transitional Care call-daughter [name]    1. Are you/is patient experiencing any problems since coming home? No Are there any questions regarding any aspect of care? No 2. Are there any questions regarding medications administration/dosing? About blood thinners, told to contact [facility] doctor Are meds being taken as prescribed? Yes Patient should review meds with caller to confirm 3. Have there been any falls? Yes 4. Has Home Health been to the house and/or have they contacted you? Yes If not, have you tried to contact them? Can we help you contact them? 5. Are bowels and bladder emptying properly? Yes Are there any unexpected incontinence issues? No If applicable, is patient following bowel/bladder programs? 6. Any fevers, problems with breathing, unexpected pain? No 7. Are there any skin problems or new areas of breakdown? No 8. Has the patient/family member arranged specialty MD follow up (ie cardiology/neurology/renal/surgical/etc)? Yes  Can we help arrange? 9. Does the patient need any other services or support that we can help arrange? Possible SW 10. Are caregivers following through as expected in assisting the patient? Yes 11. Has the patient quit smoking, drinking alcohol, or using drugs as recommended? Yes  Appointment time 2:40 pm arrive time 2:20 pm with Dr. Dagoberto Ligas  [address]

## 2018-09-28 ENCOUNTER — Telehealth: Payer: Self-pay | Admitting: Orthopedic Surgery

## 2018-09-28 ENCOUNTER — Encounter (HOSPITAL_COMMUNITY)
Admission: RE | Admit: 2018-09-28 | Discharge: 2018-09-28 | Disposition: A | Discharge status: Home / Self Care | Payer: Medicare Other | Source: Ambulatory Visit | Attending: Family Medicine | Admitting: Family Medicine

## 2018-09-28 DIAGNOSIS — Z89511 Acquired absence of right leg below knee: Secondary | ICD-10-CM | POA: Diagnosis not present

## 2018-09-28 DIAGNOSIS — E1151 Type 2 diabetes mellitus with diabetic peripheral angiopathy without gangrene: Secondary | ICD-10-CM | POA: Diagnosis not present

## 2018-09-28 DIAGNOSIS — N390 Urinary tract infection, site not specified: Secondary | ICD-10-CM | POA: Insufficient documentation

## 2018-09-28 DIAGNOSIS — Z4801 Encounter for change or removal of surgical wound dressing: Secondary | ICD-10-CM | POA: Diagnosis not present

## 2018-09-28 DIAGNOSIS — H409 Unspecified glaucoma: Secondary | ICD-10-CM | POA: Diagnosis not present

## 2018-09-28 DIAGNOSIS — E039 Hypothyroidism, unspecified: Secondary | ICD-10-CM | POA: Diagnosis not present

## 2018-09-28 DIAGNOSIS — Z4781 Encounter for orthopedic aftercare following surgical amputation: Secondary | ICD-10-CM | POA: Diagnosis not present

## 2018-09-28 DIAGNOSIS — N183 Chronic kidney disease, stage 3 (moderate): Secondary | ICD-10-CM | POA: Diagnosis not present

## 2018-09-28 DIAGNOSIS — M1990 Unspecified osteoarthritis, unspecified site: Secondary | ICD-10-CM | POA: Diagnosis not present

## 2018-09-28 DIAGNOSIS — E1122 Type 2 diabetes mellitus with diabetic chronic kidney disease: Secondary | ICD-10-CM | POA: Diagnosis not present

## 2018-09-28 DIAGNOSIS — G4730 Sleep apnea, unspecified: Secondary | ICD-10-CM | POA: Diagnosis not present

## 2018-09-28 DIAGNOSIS — E785 Hyperlipidemia, unspecified: Secondary | ICD-10-CM | POA: Diagnosis not present

## 2018-09-28 DIAGNOSIS — I509 Heart failure, unspecified: Secondary | ICD-10-CM | POA: Diagnosis not present

## 2018-09-28 DIAGNOSIS — I130 Hypertensive heart and chronic kidney disease with heart failure and stage 1 through stage 4 chronic kidney disease, or unspecified chronic kidney disease: Secondary | ICD-10-CM | POA: Diagnosis not present

## 2018-09-28 LAB — URINALYSIS, ROUTINE W REFLEX MICROSCOPIC
Bilirubin Urine: NEGATIVE
Glucose, UA: NEGATIVE mg/dL
Hgb urine dipstick: NEGATIVE
Ketones, ur: NEGATIVE mg/dL
Nitrite: POSITIVE — AB
Protein, ur: 30 mg/dL — AB
Specific Gravity, Urine: 1.009 (ref 1.005–1.030)
WBC, UA: >50 WBC/hpf — ABNORMAL HIGH (ref 0–5)
pH: 5 (ref 5.0–8.0)

## 2018-09-28 NOTE — Telephone Encounter (Signed)
Pt is a right BKA 08/28/18. Has first post op appt on 09/30/18. Should be wearing a shrinker. I will call and advise orders after evaluation in the office.

## 2018-09-28 NOTE — Telephone Encounter (Signed)
Sara/.[facility]/RN called to start care order for Weekly for wound care 1 week for 5  Once every other week for 4  [phone]

## 2018-09-29 ENCOUNTER — Telehealth: Payer: Self-pay

## 2018-09-29 DIAGNOSIS — G4730 Sleep apnea, unspecified: Secondary | ICD-10-CM | POA: Diagnosis not present

## 2018-09-29 DIAGNOSIS — E785 Hyperlipidemia, unspecified: Secondary | ICD-10-CM | POA: Diagnosis not present

## 2018-09-29 DIAGNOSIS — H409 Unspecified glaucoma: Secondary | ICD-10-CM | POA: Diagnosis not present

## 2018-09-29 DIAGNOSIS — Z4801 Encounter for change or removal of surgical wound dressing: Secondary | ICD-10-CM | POA: Diagnosis not present

## 2018-09-29 DIAGNOSIS — E1151 Type 2 diabetes mellitus with diabetic peripheral angiopathy without gangrene: Secondary | ICD-10-CM | POA: Diagnosis not present

## 2018-09-29 DIAGNOSIS — I509 Heart failure, unspecified: Secondary | ICD-10-CM | POA: Diagnosis not present

## 2018-09-29 DIAGNOSIS — M1990 Unspecified osteoarthritis, unspecified site: Secondary | ICD-10-CM | POA: Diagnosis not present

## 2018-09-29 DIAGNOSIS — Z89511 Acquired absence of right leg below knee: Secondary | ICD-10-CM | POA: Diagnosis not present

## 2018-09-29 DIAGNOSIS — E1122 Type 2 diabetes mellitus with diabetic chronic kidney disease: Secondary | ICD-10-CM | POA: Diagnosis not present

## 2018-09-29 DIAGNOSIS — E039 Hypothyroidism, unspecified: Secondary | ICD-10-CM | POA: Diagnosis not present

## 2018-09-29 DIAGNOSIS — I130 Hypertensive heart and chronic kidney disease with heart failure and stage 1 through stage 4 chronic kidney disease, or unspecified chronic kidney disease: Secondary | ICD-10-CM | POA: Diagnosis not present

## 2018-09-29 DIAGNOSIS — Z4781 Encounter for orthopedic aftercare following surgical amputation: Secondary | ICD-10-CM | POA: Diagnosis not present

## 2018-09-29 DIAGNOSIS — N183 Chronic kidney disease, stage 3 (moderate): Secondary | ICD-10-CM | POA: Diagnosis not present

## 2018-09-29 NOTE — Telephone Encounter (Signed)
[facility] called requesting verbal orders for 2x/wk for 4 weeks. Order given.

## 2018-09-30 ENCOUNTER — Telehealth: Payer: Self-pay

## 2018-09-30 ENCOUNTER — Encounter: Payer: Self-pay | Admitting: Orthopedic Surgery

## 2018-09-30 ENCOUNTER — Ambulatory Visit (INDEPENDENT_AMBULATORY_CARE_PROVIDER_SITE_OTHER): Payer: Medicare Other | Admitting: Orthopedic Surgery

## 2018-09-30 VITALS — Ht 71.0 in | Wt 235.9 lb

## 2018-09-30 DIAGNOSIS — Z89511 Acquired absence of right leg below knee: Secondary | ICD-10-CM

## 2018-09-30 LAB — URINE CULTURE: Culture: >=100000 — AB

## 2018-09-30 NOTE — Progress Notes (Signed)
Post-Op Visit Note   Patient: [name]           Date of Birth: 1927-08-08           MRN: [id number] Visit Date: 09/30/2018 PCP: [name], MD  Chief Complaint:  Chief Complaint  Patient presents with  . Right Leg - Routine Post Op    08/28/2018 right BKA    HPI:  HPI The patient is an 85 year old gentleman seen today status post right below the knee amputation.  This is his first postoperative visit.  He is wearing his limb protector and shrinker.  Follows with [facility]. Ortho Exam Incision well approximated with staples in place there are a few scattered areas with eschar.  There is no gaping no drainage no erythema no maceration no sign of infection  Visit Diagnoses: No diagnosis found.  Plan: Staples harvested today.  He will continue daily Dial soap cleansing and dry dressing changes.  May wear the shrinker with direct skin contact.  May discontinue the limb protector while sleeping.  He will follow-up in 2 weeks.  Did provide an order for his prosthesis today.  Follow-Up Instructions: No follow-ups on file.   Imaging: No results found.  Orders:  No orders of the defined types were placed in this encounter.  No orders of the defined types were placed in this encounter.    PMFS History: Patient Active Problem List   Diagnosis Date Noted  . Hyponatremia   . Essential hypertension   . Postoperative pain   . Pain of left heel   . Unable to maintain weight-bearing   . Type 2 diabetes mellitus with diabetic peripheral angiopathy with gangrene ([place])   . Anemia due to acute blood loss   . Stage 3 chronic kidney disease ([place])   . Right below-knee amputee ([place]) 09/07/2018  . Gangrene of right foot ([place])   . DNR (do not resuscitate) discussion   . Goals of care, counseling/discussion   . Palliative care by specialist   . Type 2 diabetes mellitus with foot ulcer and gangrene ([place])   . Severe protein-calorie malnutrition ([place])   . Ischemic foot  08/20/2018  . Critical lower limb ischemia 08/03/2018  . Type 2 diabetes mellitus with foot ulcer, without long-term current use of insulin ([place])   . Gastroesophageal reflux disease   . Cellulitis of great toe of right foot 05/29/2018  . Atrial fibrillation, chronic   . Chest pain 07/25/2017  . PAF (paroxysmal atrial fibrillation) ([place]) 07/25/2017  . BPH (benign prostatic hyperplasia) 07/25/2017  . Shortness of breath 05/11/2017  . Hypothyroidism 05/11/2017  . Chronic diastolic CHF (congestive heart failure) ([place]) 05/11/2017  . Aortic valve stenosis 06/12/2016  . RBBB 06/12/2016  . PAD (peripheral artery disease) ([place]) 08/04/2012  . Essential hypertension, benign 08/04/2012  . Hyperlipidemia 08/04/2012  . Dizziness 08/04/2012   Past Medical History:  Diagnosis Date  . Arthritis   . Borderline diabetic   . Glaucoma   . Hyperlipidemia   . Hypertension   . PVD (peripheral vascular disease) ([place])   . Sleep apnea    Cpap ordered but doesnt use    Family History  Problem Relation Age of Onset  . Cancer Mother   . Heart attack Father   . Stroke Father   . Heart attack Brother   . Heart attack Brother     Past Surgical History:  Procedure Laterality Date  . ABDOMINAL AORTOGRAM W/LOWER EXTREMITY N/A 07/27/2018   Procedure:  ABDOMINAL AORTOGRAM W/LOWER EXTREMITY;  Surgeon: Lorretta Harp, MD;  Location: [facility];  Service: Cardiovascular;  Laterality: N/A;  . AMPUTATION Right 08/28/2018   Procedure: RIGHT BELOW KNEE AMPUTATION;  Surgeon: [name], MD;  Location: [facility];  Service: Orthopedics;  Laterality: Right;  . APPENDECTOMY  1943  . CATARACT EXTRACTION  2012   x2  . COLONOSCOPY N/A 11/01/2014   Procedure: COLONOSCOPY;  Surgeon: Aviva Signs, MD;  [facility];  Service: Gastroenterology;  Laterality: N/A;  . ESOPHAGOGASTRODUODENOSCOPY N/A 11/01/2014   Procedure: ESOPHAGOGASTRODUODENOSCOPY (EGD);  Surgeon: Aviva Signs, MD;  [facility];  Service: Gastroenterology;  Laterality: N/A;  . HERNIA REPAIR Left   . INGUINAL HERNIA REPAIR Right 03/01/2013   Procedure: RIGHT INGUINAL HERNIORRHAPHY;  Surgeon: Jamesetta So, MD;  [facility];  Service: General;  Laterality: Right;  . INSERTION OF MESH Right 03/01/2013   Procedure: INSERTION OF MESH;  Surgeon: Jamesetta So, MD;  [facility];  Service: General;  Laterality: Right;  . LOWER EXTREMITY ANGIOGRAPHY Right 08/25/2018   Procedure: LOWER EXTREMITY ANGIOGRAPHY;  Surgeon: Wellington Hampshire, MD;  Location: [facility];  Service: Cardiovascular;  Laterality: Right;  . NM MYOCAR PERF WALL MOTION  06/05/2010   no significant ischemia  . PERIPHERAL VASCULAR ATHERECTOMY Right 08/03/2018   Procedure: PERIPHERAL VASCULAR ATHERECTOMY;  Surgeon: Lorretta Harp, MD;  Location: [facility];  Service: Cardiovascular;  Laterality: Right;  . stent  06/14/2010   left leg  . US ECHOCARDIOGRAPHY  01/21/2006   moderate mitral annular ca+, mild MR, AOV moderately sclerotic.   Social History   Occupational History  . Not on file  Tobacco Use  . Smoking status: Former Smoker    Quit date: 01/14/1935    Years since quitting: 83.7  . Smokeless tobacco: Former Systems developer    Types: Chew  Substance and Sexual Activity  . Alcohol use: No  . Drug use: No  . Sexual activity: Not on file

## 2018-09-30 NOTE — Telephone Encounter (Signed)
Kat, OT with [facility] would like to have verbal orders for [facility] for patient 1 x week for 1 week, and 2 x week for 3 weeks.  Cb# is [phone].  Please advise.  Thank you.

## 2018-10-01 NOTE — Telephone Encounter (Signed)
Called and sw [name] to give verbal ok for [facility] to see pt once a week for 5 weeks then every other for 4 weeks. Dial soap cleansing, dry dressing if drainage and shrinker. To call with any questions.

## 2018-10-02 ENCOUNTER — Other Ambulatory Visit: Payer: Self-pay

## 2018-10-02 DIAGNOSIS — E1151 Type 2 diabetes mellitus with diabetic peripheral angiopathy without gangrene: Secondary | ICD-10-CM | POA: Diagnosis not present

## 2018-10-02 DIAGNOSIS — I130 Hypertensive heart and chronic kidney disease with heart failure and stage 1 through stage 4 chronic kidney disease, or unspecified chronic kidney disease: Secondary | ICD-10-CM | POA: Diagnosis not present

## 2018-10-02 DIAGNOSIS — H409 Unspecified glaucoma: Secondary | ICD-10-CM | POA: Diagnosis not present

## 2018-10-02 DIAGNOSIS — Z89511 Acquired absence of right leg below knee: Secondary | ICD-10-CM | POA: Diagnosis not present

## 2018-10-02 DIAGNOSIS — E785 Hyperlipidemia, unspecified: Secondary | ICD-10-CM | POA: Diagnosis not present

## 2018-10-02 DIAGNOSIS — M1990 Unspecified osteoarthritis, unspecified site: Secondary | ICD-10-CM | POA: Diagnosis not present

## 2018-10-02 DIAGNOSIS — G4730 Sleep apnea, unspecified: Secondary | ICD-10-CM | POA: Diagnosis not present

## 2018-10-02 DIAGNOSIS — Z4801 Encounter for change or removal of surgical wound dressing: Secondary | ICD-10-CM | POA: Diagnosis not present

## 2018-10-02 DIAGNOSIS — E1122 Type 2 diabetes mellitus with diabetic chronic kidney disease: Secondary | ICD-10-CM | POA: Diagnosis not present

## 2018-10-02 DIAGNOSIS — N183 Chronic kidney disease, stage 3 (moderate): Secondary | ICD-10-CM | POA: Diagnosis not present

## 2018-10-02 DIAGNOSIS — Z4781 Encounter for orthopedic aftercare following surgical amputation: Secondary | ICD-10-CM | POA: Diagnosis not present

## 2018-10-02 DIAGNOSIS — I509 Heart failure, unspecified: Secondary | ICD-10-CM | POA: Diagnosis not present

## 2018-10-02 DIAGNOSIS — E039 Hypothyroidism, unspecified: Secondary | ICD-10-CM | POA: Diagnosis not present

## 2018-10-02 NOTE — Telephone Encounter (Signed)
[name] was called and given VO on voicemail.

## 2018-10-02 NOTE — Patient Outreach (Signed)
[facility]) Care Management  10/02/2018  WELBORN KEENA 02-Jul-1927 [id number]   Medication Adherence call to Mr. Arvis Zwahlen spoke with patient he wants a call back  At a later time, patient is past due on Atorvastatin 40 mg under [place].   [facility] Management Direct Dial [phone]  Fax [phone] [email or web address]

## 2018-10-05 ENCOUNTER — Encounter
Payer: Medicare Other | Attending: Physical Medicine and Rehabilitation | Admitting: Physical Medicine and Rehabilitation

## 2018-10-05 ENCOUNTER — Other Ambulatory Visit: Payer: Self-pay

## 2018-10-05 ENCOUNTER — Encounter: Payer: Self-pay | Admitting: Physical Medicine and Rehabilitation

## 2018-10-05 VITALS — BP 169/72 | HR 60 | Temp 97.5°F | Ht 71.0 in | Wt 220.0 lb

## 2018-10-05 DIAGNOSIS — Z89511 Acquired absence of right leg below knee: Secondary | ICD-10-CM | POA: Diagnosis not present

## 2018-10-05 NOTE — Patient Instructions (Addendum)
Patient is an 85 yr old year old male with R BKA due to PVD Here for rehab f/u  1. Will have pt see [facility] for prosthesis possible fitting- this Friday. Discussed prosthesis  2. No pain meds- since no pain  3. Drink plenty of fluids!!!!!! Every day! Needs to drink 1500cc/day which is equal to around 3 20 oz drinks/day.  4. F/U as needed- best treatment for swelling is white TED hose.   5. Needs BP to be lower- talk to PCP. Per son, usually well controlled.  6. WALK as soon as gets prosthesis. As much as possible.

## 2018-10-05 NOTE — Progress Notes (Signed)
Subjective:    Patient ID: [name], male    DOB: 02-25-1927, 85 y.o.   MRN: [id number]  HPI   CC: R BKA Pt is an 85 yr old male with recent R BKA here for rehab f/u s/p d/c from rehab unit.  Got staples out on 9/16- no drainage. A little bleeding when they were pulled out- but no actual drainage.  No residual limb pain and no phantom pain.  Goes Friday to see [facility] for prosthesis fitting. Has Rx already.  Started taking ABX for UTI last Thursday- was dx'd last week. Did Cx- and on "right ABX"- much better looking urine now-  was almost white, so full of pus\/WBCs. Using urinal to void; BMs are going well- eating well - transferring via [place] because has 1 in family.   Standing some- can stand at least 1 minute- part of UTI, likely.   Going every hour to bathroom- son/every one sleeping next to hospital bed in chair. Went to church yesterday. Listen to church outside on radio.  Got Lucianne Lei that s w/c accessible- with ramp.  Nurse coming 1x/week PT trying to get there every 2-3 days x 3 weeks. OT- coming 1-2x/week, maybe.   Social Hx: Eldest son does most of the car,e even though he lives in [place], [place]. Daughter in [place] teaches 3 days/week-off 2 days/week;  is Pharmacist, hospital. Other son is also helping as well.    Pain Inventory Average Pain 0 Pain Right Now 0 My pain is na  In the last 24 hours, has pain interfered with the following? General activity 0 Relation with others 0 Enjoyment of life 0 What TIME of day is your pain at its worst? na Sleep (in general) Fair  Pain is worse with: na Pain improves with: na Relief from Meds: na  Mobility use a wheelchair  Function retired I need assistance with the following:  dressing, bathing, toileting, meal prep, household duties and shopping  Neuro/Psych bladder control problems  Prior Studies Any changes since last visit?  no  Physicians involved in your care Any changes since last  visit?  no   Family History  Problem Relation Age of Onset  . Cancer Mother   . Heart attack Father   . Stroke Father   . Heart attack Brother   . Heart attack Brother    Social History   Socioeconomic History  . Marital status: Widowed    Spouse name: Not on file  . Number of children: Not on file  . Years of education: Not on file  . Highest education level: Not on file  Occupational History  . Not on file  Social Needs  . Financial resource strain: Not on file  . Food insecurity    Worry: Not on file    Inability: Not on file  . Transportation needs    Medical: Not on file    Non-medical: Not on file  Tobacco Use  . Smoking status: Former Smoker    Quit date: 01/14/1935    Years since quitting: 83.7  . Smokeless tobacco: Former Systems developer    Types: Chew  Substance and Sexual Activity  . Alcohol use: No  . Drug use: No  . Sexual activity: Not on file  Lifestyle  . Physical activity    Days per week: Not on file    Minutes per session: Not on file  . Stress: Not on file  Relationships  . Social connections    Talks  on phone: Not on file    Gets together: Not on file    Attends religious service: Not on file    Active member of club or organization: Not on file    Attends meetings of clubs or organizations: Not on file    Relationship status: Not on file  Other Topics Concern  . Not on file  Social History Narrative  . Not on file   Past Surgical History:  Procedure Laterality Date  . ABDOMINAL AORTOGRAM W/LOWER EXTREMITY N/A 07/27/2018   Procedure: ABDOMINAL AORTOGRAM W/LOWER EXTREMITY;  Surgeon: Lorretta Harp, MD;  Location: [facility];  Service: Cardiovascular;  Laterality: N/A;  . AMPUTATION Right 08/28/2018   Procedure: RIGHT BELOW KNEE AMPUTATION;  Surgeon: [name], MD;  Location: [facility];  Service: Orthopedics;  Laterality: Right;  . APPENDECTOMY  1943  . CATARACT EXTRACTION  2012   x2  . COLONOSCOPY N/A 11/01/2014   Procedure:  COLONOSCOPY;  Surgeon: Aviva Signs, MD;  [facility];  Service: Gastroenterology;  Laterality: N/A;  . ESOPHAGOGASTRODUODENOSCOPY N/A 11/01/2014   Procedure: ESOPHAGOGASTRODUODENOSCOPY (EGD);  Surgeon: Aviva Signs, MD;  [facility];  Service: Gastroenterology;  Laterality: N/A;  . HERNIA REPAIR Left   . INGUINAL HERNIA REPAIR Right 03/01/2013   Procedure: RIGHT INGUINAL HERNIORRHAPHY;  Surgeon: Jamesetta So, MD;  [facility];  Service: General;  Laterality: Right;  . INSERTION OF MESH Right 03/01/2013   Procedure: INSERTION OF MESH;  Surgeon: Jamesetta So, MD;  [facility];  Service: General;  Laterality: Right;  . LOWER EXTREMITY ANGIOGRAPHY Right 08/25/2018   Procedure: LOWER EXTREMITY ANGIOGRAPHY;  Surgeon: Wellington Hampshire, MD;  Location: [facility];  Service: Cardiovascular;  Laterality: Right;  . NM MYOCAR PERF WALL MOTION  06/05/2010   no significant ischemia  . PERIPHERAL VASCULAR ATHERECTOMY Right 08/03/2018   Procedure: PERIPHERAL VASCULAR ATHERECTOMY;  Surgeon: Lorretta Harp, MD;  Location: [facility];  Service: Cardiovascular;  Laterality: Right;  . stent  06/14/2010   left leg  . US ECHOCARDIOGRAPHY  01/21/2006   moderate mitral annular ca+, mild MR, AOV moderately sclerotic.   Past Medical History:  Diagnosis Date  . Arthritis   . Borderline diabetic   . Glaucoma   . Hyperlipidemia   . Hypertension   . PVD (peripheral vascular disease) ([place])   . Sleep apnea    Cpap ordered but doesnt use   BP (!) 169/72   Pulse 60   Temp (!) 97.5 F (36.4 C)   Ht 5\' 11"  (1.803 m)   Wt 220 lb (99.8 kg)   SpO2 98%   BMI 30.68 kg/m   Opioid Risk Score:   Fall Risk Score:  `1  Depression screen PHQ 2/9  No flowsheet data found.   Review of Systems  Constitutional: Negative.   HENT: Negative.   Eyes: Negative.   Respiratory: Negative.   Cardiovascular: Negative.   Gastrointestinal: Negative.   Endocrine: Negative.    Genitourinary: Positive for difficulty urinating and dysuria.  Musculoskeletal: Negative.   Skin: Negative.   Allergic/Immunologic: Negative.   Neurological: Negative.   Hematological: Bruises/bleeds easily.  Psychiatric/Behavioral: Negative.   All other systems reviewed and are negative.      Objective:   Physical Exam Awake, alert, appropriate, in manual w/c, accompanied by son, NAD R BKA- some edema noted throughout entire RLE A few scabs still noted on incision line- staples out. No drainage; no  erythema, no heat 2-3+ pitting LE edema to knee on L.      Assessment & Plan:  Patient is an 85 yr old year old male with R BKA due to PVD Here for rehab f/u  1. Will have pt see [facility] for prosthesis possible fitting- this Friday.  2. No pain meds- since no pain  3. Drink plenty of fluids!!!!!! Every day! Needs to drink 1500cc/day which is equal to around 3 20 oz drinks/day.  4. F/U as needed- best treatment for swelling is white TED hose.  5. Needs BP to be lower- talk to PCP. Per son, usually well controlled.  6. Walk as soon as gets prosthesis.   I spent a total of 25 minutes in appointment- more than 15 minutes discussing need to keep L leg, need prevent more UTIs, and walking need.

## 2018-10-06 DIAGNOSIS — N183 Chronic kidney disease, stage 3 (moderate): Secondary | ICD-10-CM | POA: Diagnosis not present

## 2018-10-06 DIAGNOSIS — E039 Hypothyroidism, unspecified: Secondary | ICD-10-CM | POA: Diagnosis not present

## 2018-10-06 DIAGNOSIS — E1122 Type 2 diabetes mellitus with diabetic chronic kidney disease: Secondary | ICD-10-CM | POA: Diagnosis not present

## 2018-10-06 DIAGNOSIS — G4730 Sleep apnea, unspecified: Secondary | ICD-10-CM | POA: Diagnosis not present

## 2018-10-06 DIAGNOSIS — H409 Unspecified glaucoma: Secondary | ICD-10-CM | POA: Diagnosis not present

## 2018-10-06 DIAGNOSIS — E1151 Type 2 diabetes mellitus with diabetic peripheral angiopathy without gangrene: Secondary | ICD-10-CM | POA: Diagnosis not present

## 2018-10-06 DIAGNOSIS — Z89511 Acquired absence of right leg below knee: Secondary | ICD-10-CM | POA: Diagnosis not present

## 2018-10-06 DIAGNOSIS — I509 Heart failure, unspecified: Secondary | ICD-10-CM | POA: Diagnosis not present

## 2018-10-06 DIAGNOSIS — I130 Hypertensive heart and chronic kidney disease with heart failure and stage 1 through stage 4 chronic kidney disease, or unspecified chronic kidney disease: Secondary | ICD-10-CM | POA: Diagnosis not present

## 2018-10-06 DIAGNOSIS — Z4781 Encounter for orthopedic aftercare following surgical amputation: Secondary | ICD-10-CM | POA: Diagnosis not present

## 2018-10-06 DIAGNOSIS — Z4801 Encounter for change or removal of surgical wound dressing: Secondary | ICD-10-CM | POA: Diagnosis not present

## 2018-10-06 DIAGNOSIS — E785 Hyperlipidemia, unspecified: Secondary | ICD-10-CM | POA: Diagnosis not present

## 2018-10-06 DIAGNOSIS — M1990 Unspecified osteoarthritis, unspecified site: Secondary | ICD-10-CM | POA: Diagnosis not present

## 2018-10-07 ENCOUNTER — Telehealth: Payer: Self-pay | Admitting: Cardiovascular Disease

## 2018-10-07 DIAGNOSIS — I130 Hypertensive heart and chronic kidney disease with heart failure and stage 1 through stage 4 chronic kidney disease, or unspecified chronic kidney disease: Secondary | ICD-10-CM | POA: Diagnosis not present

## 2018-10-07 DIAGNOSIS — H409 Unspecified glaucoma: Secondary | ICD-10-CM | POA: Diagnosis not present

## 2018-10-07 DIAGNOSIS — M1990 Unspecified osteoarthritis, unspecified site: Secondary | ICD-10-CM | POA: Diagnosis not present

## 2018-10-07 DIAGNOSIS — I509 Heart failure, unspecified: Secondary | ICD-10-CM | POA: Diagnosis not present

## 2018-10-07 DIAGNOSIS — E039 Hypothyroidism, unspecified: Secondary | ICD-10-CM | POA: Diagnosis not present

## 2018-10-07 DIAGNOSIS — G4730 Sleep apnea, unspecified: Secondary | ICD-10-CM | POA: Diagnosis not present

## 2018-10-07 DIAGNOSIS — N183 Chronic kidney disease, stage 3 (moderate): Secondary | ICD-10-CM | POA: Diagnosis not present

## 2018-10-07 DIAGNOSIS — E1151 Type 2 diabetes mellitus with diabetic peripheral angiopathy without gangrene: Secondary | ICD-10-CM | POA: Diagnosis not present

## 2018-10-07 DIAGNOSIS — Z4801 Encounter for change or removal of surgical wound dressing: Secondary | ICD-10-CM | POA: Diagnosis not present

## 2018-10-07 DIAGNOSIS — E1122 Type 2 diabetes mellitus with diabetic chronic kidney disease: Secondary | ICD-10-CM | POA: Diagnosis not present

## 2018-10-07 DIAGNOSIS — Z89511 Acquired absence of right leg below knee: Secondary | ICD-10-CM | POA: Diagnosis not present

## 2018-10-07 DIAGNOSIS — E785 Hyperlipidemia, unspecified: Secondary | ICD-10-CM | POA: Diagnosis not present

## 2018-10-07 DIAGNOSIS — Z4781 Encounter for orthopedic aftercare following surgical amputation: Secondary | ICD-10-CM | POA: Diagnosis not present

## 2018-10-07 NOTE — Telephone Encounter (Signed)
New message   Patient's daughter would like a return phone call about the patient's medication. Please call.

## 2018-10-07 NOTE — Telephone Encounter (Signed)
Called and spoke to daughter-  Past two days BP has been increased,  180/80 yesterday 174/82 this morning 148/84 occupational therapist just checked it again.  Daughter denies chest pain/SOB/weight gain (not really able to check)   Patient does mention his diet has changed some- with the increase sodium- discussed the cut back on salt and sodium.   Does mention having some swelling in his left leg- he does have 80% blockage, he just had right leg amputated and they are wondering when they could have something done about the left leg, just so it doesn't get to the point of the right one.  Advised daughter I would route message to Dr.Berry to advise.

## 2018-10-07 NOTE — Telephone Encounter (Signed)
New Message  Pt c/o BP issue: STAT if pt c/o blurred vision, one-sided weakness or slurred speech  1. What are your last 5 BP readings? 174/82  2. Are you having any other symptoms (ex. Dizziness, headache, blurred vision, passed out)? No  3. What is your BP issue? Patient's daughter is calling in stating that patient's blood pressure is elevated and he has some swelling in his left leg. States that a lot of the patient's medications were stopped when he was hospitalized. Patient's daughter wants to know what exactly to do about the blood pressure and leg leg swelling. Please give a call back to assist.

## 2018-10-07 NOTE — Telephone Encounter (Signed)
Happy to see him back to discuss

## 2018-10-08 ENCOUNTER — Telehealth: Payer: Self-pay

## 2018-10-08 DIAGNOSIS — Z4801 Encounter for change or removal of surgical wound dressing: Secondary | ICD-10-CM | POA: Diagnosis not present

## 2018-10-08 DIAGNOSIS — R201 Hypoesthesia of skin: Secondary | ICD-10-CM | POA: Diagnosis not present

## 2018-10-08 DIAGNOSIS — M1990 Unspecified osteoarthritis, unspecified site: Secondary | ICD-10-CM | POA: Diagnosis not present

## 2018-10-08 DIAGNOSIS — E039 Hypothyroidism, unspecified: Secondary | ICD-10-CM | POA: Diagnosis not present

## 2018-10-08 DIAGNOSIS — L03116 Cellulitis of left lower limb: Secondary | ICD-10-CM | POA: Diagnosis not present

## 2018-10-08 DIAGNOSIS — Z4781 Encounter for orthopedic aftercare following surgical amputation: Secondary | ICD-10-CM | POA: Diagnosis not present

## 2018-10-08 DIAGNOSIS — N183 Chronic kidney disease, stage 3 (moderate): Secondary | ICD-10-CM | POA: Diagnosis not present

## 2018-10-08 DIAGNOSIS — I4891 Unspecified atrial fibrillation: Secondary | ICD-10-CM | POA: Diagnosis not present

## 2018-10-08 DIAGNOSIS — I10 Essential (primary) hypertension: Secondary | ICD-10-CM | POA: Diagnosis not present

## 2018-10-08 DIAGNOSIS — B353 Tinea pedis: Secondary | ICD-10-CM | POA: Diagnosis not present

## 2018-10-08 DIAGNOSIS — I130 Hypertensive heart and chronic kidney disease with heart failure and stage 1 through stage 4 chronic kidney disease, or unspecified chronic kidney disease: Secondary | ICD-10-CM | POA: Diagnosis not present

## 2018-10-08 DIAGNOSIS — I739 Peripheral vascular disease, unspecified: Secondary | ICD-10-CM | POA: Diagnosis not present

## 2018-10-08 DIAGNOSIS — Z89511 Acquired absence of right leg below knee: Secondary | ICD-10-CM | POA: Diagnosis not present

## 2018-10-08 DIAGNOSIS — H409 Unspecified glaucoma: Secondary | ICD-10-CM | POA: Diagnosis not present

## 2018-10-08 DIAGNOSIS — E1129 Type 2 diabetes mellitus with other diabetic kidney complication: Secondary | ICD-10-CM | POA: Diagnosis not present

## 2018-10-08 DIAGNOSIS — E1151 Type 2 diabetes mellitus with diabetic peripheral angiopathy without gangrene: Secondary | ICD-10-CM | POA: Diagnosis not present

## 2018-10-08 DIAGNOSIS — I350 Nonrheumatic aortic (valve) stenosis: Secondary | ICD-10-CM | POA: Diagnosis not present

## 2018-10-08 DIAGNOSIS — I509 Heart failure, unspecified: Secondary | ICD-10-CM | POA: Diagnosis not present

## 2018-10-08 DIAGNOSIS — G4730 Sleep apnea, unspecified: Secondary | ICD-10-CM | POA: Diagnosis not present

## 2018-10-08 DIAGNOSIS — E785 Hyperlipidemia, unspecified: Secondary | ICD-10-CM | POA: Diagnosis not present

## 2018-10-08 DIAGNOSIS — E1122 Type 2 diabetes mellitus with diabetic chronic kidney disease: Secondary | ICD-10-CM | POA: Diagnosis not present

## 2018-10-08 NOTE — Telephone Encounter (Signed)
Noted. Will contact pt daughter 9/25

## 2018-10-09 ENCOUNTER — Ambulatory Visit (INDEPENDENT_AMBULATORY_CARE_PROVIDER_SITE_OTHER): Payer: Medicare Other | Admitting: Cardiovascular Disease

## 2018-10-09 ENCOUNTER — Other Ambulatory Visit: Payer: Self-pay

## 2018-10-09 ENCOUNTER — Other Ambulatory Visit: Payer: Self-pay | Admitting: Internal Medicine

## 2018-10-09 ENCOUNTER — Encounter: Payer: Self-pay | Admitting: Cardiovascular Disease

## 2018-10-09 VITALS — BP 144/66 | HR 61 | Ht 71.0 in

## 2018-10-09 DIAGNOSIS — I70229 Atherosclerosis of native arteries of extremities with rest pain, unspecified extremity: Secondary | ICD-10-CM

## 2018-10-09 DIAGNOSIS — I739 Peripheral vascular disease, unspecified: Secondary | ICD-10-CM

## 2018-10-09 DIAGNOSIS — I998 Other disorder of circulatory system: Secondary | ICD-10-CM

## 2018-10-09 DIAGNOSIS — L03116 Cellulitis of left lower limb: Secondary | ICD-10-CM

## 2018-10-09 MED ORDER — APIXABAN 2.5 MG PO TABS: 2.5000 mg | ORAL_TABLET | Freq: Two times a day (BID) | ORAL | 3 refills | Status: DC

## 2018-10-09 NOTE — Progress Notes (Signed)
10/09/2018 [name]   [birth year]  [id number]  Primary Physician [name], MD Primary Cardiologist: Lorretta Harp MD [name], [place]  HPI:  [name] is a 85 y.o.  accompanied by [name]. He was last seen in the office 08/11/2018.Marland KitchenHe has a history of hypertension, hyperlipidemia, obstructive sleep apnea and peripheral vascular disease. I apparently stented his left SFA May 2012. He complainedof numbness in his lower extremities specifically his right foot and left leg. His Dopplers performed 8/7/2018revealed right ABI 0.82 and a left ABI of 0.84. He hadpalpable pedal pulses on exam.  He had his right toenail removed by Dr. Paulla Dolly 05/07/2018 after he had clipped it himself prior to that and and developed an infection. Since that time he has had development of gangrene and is required multiple rounds of antibiotics. He is also had worsening lower extremity edema.   He had Dopplers performed 07/21/2018 revealed an ABI of 0.43 on the right with 0 vessel runoff.  I performed peripheral angiography on him 07/27/2018 via the left femoral approach revealing a patent mid to distal left SFA stent, high-grade calcified disease beyond that and 0 vessel runoff on the left as well as on the right.  Also had a 99% calcified lesion in the distal right SFA/above-knee popliteal artery.  Because I had used 135 cc of contrast I elected not to intervene at that time but this stage intervention for limb salvage.  I performed staged distal right SFA/popliteal intervention using orbital atherectomy and drug-coated balloon angioplasty on 08/03/2018 which was successful.  He did have 0 vessel runoff however.  Because the wound continued to deteriorate he had staged right SFA intervention by Dr. Fletcher Anon 08/25/2018 with tibial pedal access and recanalization of an occluded anterior tibial.  Unfortunately, the wound continued to deteriorate and the ultimate underwent right BKA by Dr.  Sharol Given .08/28/2018.  This is slowly healing.  He is getting OT/PT with the intent of fitting him with a prosthesis.    Current Meds  Medication Sig  . acetaminophen (TYLENOL) 325 MG tablet Take 1-2 tablets (325-650 mg total) by mouth every 6 (six) hours as needed for mild pain (pain score 1-3 or temp > 100.5).  [name] aspirin EC 81 MG tablet Take 81 mg by mouth daily.  [name] atorvastatin (LIPITOR) 40 MG tablet Take 1 tablet (40 mg total) by mouth daily.  . ciprofloxacin (CIPRO) 500 MG tablet Take 1 tablet by mouth 2 (two) times daily.  . clindamycin (CLEOCIN) 300 MG capsule Take 1 capsule by mouth 4 (four) times daily.  [name] docusate sodium (COLACE) 100 MG capsule Take 100 mg by mouth 2 (two) times daily as needed (for use with oxycodone to prevent constipation.).  [name] dorzolamide-timolol (COSOPT) 22.3-6.8 MG/ML ophthalmic solution Place 1 drop into both eyes 2 (two) times daily.  [name] doxazosin (CARDURA) 2 MG tablet Take 1 tablet (2 mg total) by mouth at bedtime.  . famotidine (PEPCID) 20 MG tablet Take 1 tablet (20 mg total) by mouth 2 (two) times daily.  . finasteride (PROSCAR) 5 MG tablet Take 1 tablet (5 mg total) by mouth every evening.  [name] glipiZIDE (GLUCOTROL) 5 MG tablet Take 5 mg by mouth 2 (two) times daily.   [name] levothyroxine (SYNTHROID) 50 MCG tablet Take 1 tablet (50 mcg total) by mouth daily before breakfast.  . methocarbamol (ROBAXIN) 500 MG tablet Take 1 tablet (500 mg total) by mouth every 6 (six) hours as needed for muscle spasms.  [name]  methocarbamol (ROBAXIN) 500 MG tablet Take 500 mg by mouth every 6 (six) hours as needed for muscle spasms.  . nitrofurantoin (MACRODANTIN) 100 MG capsule Take 100 mg by mouth 2 (two) times daily.  [name] nystatin cream (MYCOSTATIN) 2 (two) times daily.  . Omega-3 Fatty Acids (FISH OIL PO) Take 1 capsule by mouth daily.  . tamsulosin (FLOMAX) 0.4 MG CAPS capsule Take 1 capsule (0.4 mg total) by mouth 2 (two) times daily.  . vitamin C (ASCORBIC ACID) 500 MG tablet Take  500 mg by mouth daily.  . vitamin E 400 UNIT capsule Take 400 Units by mouth daily.  . [DISCONTINUED] clopidogrel (PLAVIX) 75 MG tablet Take 1 tablet (75 mg total) by mouth daily with breakfast.     Allergies  Allergen Reactions  . Iodinated Diagnostic Agents     Other reaction(s): Fever  . Dye Fdc Red [Red Dye] Hives  . Iodine-131 Other (See Comments)    Breakout, fever  . Iohexol      Code: RASH, Desc: PT STATES HOT ALL OVER HAD TO HAVE ICE PACKS ALL OVER HIM AND DIFF BREATHING, PT REFUSED IV DYE     Social History   Socioeconomic History  . Marital status: Widowed    Spouse name: Not on file  . Number of children: Not on file  . Years of education: Not on file  . Highest education level: Not on file  Occupational History  . Not on file  Social Needs  . Financial resource strain: Not on file  . Food insecurity    Worry: Not on file    Inability: Not on file  . Transportation needs    Medical: Not on file    Non-medical: Not on file  Tobacco Use  . Smoking status: Former Smoker    Quit date: 01/14/1935    Years since quitting: 83.7  . Smokeless tobacco: Former Systems developer    Types: Chew  Substance and Sexual Activity  . Alcohol use: No  . Drug use: No  . Sexual activity: Not on file  Lifestyle  . Physical activity    Days per week: Not on file    Minutes per session: Not on file  . Stress: Not on file  Relationships  . Social Herbalist on phone: Not on file    Gets together: Not on file    Attends religious service: Not on file    Active member of club or organization: Not on file    Attends meetings of clubs or organizations: Not on file    Relationship status: Not on file  . Intimate partner violence    Fear of current or ex partner: Not on file    Emotionally abused: Not on file    Physically abused: Not on file    Forced sexual activity: Not on file  Other Topics Concern  . Not on file  Social History Narrative  . Not on file     Review of  Systems: General: negative for chills, fever, night sweats or weight changes.  Cardiovascular: negative for chest pain, dyspnea on exertion, edema, orthopnea, palpitations, paroxysmal nocturnal dyspnea or shortness of breath Dermatological: negative for rash Respiratory: negative for cough or wheezing Urologic: negative for hematuria Abdominal: negative for nausea, vomiting, diarrhea, bright red blood per rectum, melena, or hematemesis Neurologic: negative for visual changes, syncope, or dizziness All other systems reviewed and are otherwise negative except as noted above.    Blood pressure (!) 144/66, pulse  61, height 5\' 11"  (1.803 m).  General appearance: alert and no distress Neck: no adenopathy, no carotid bruit, no JVD, supple, symmetrical, trachea midline and thyroid not enlarged, symmetric, no tenderness/mass/nodules Lungs: clear to auscultation bilaterally Heart: 2/6 outflow tract murmur consistent with aortic stenosis. Extremities: Status post right BKA.  Mild edema in the left leg with some left lower extremity cellulitis. Pulses: Absent pedal pulses Skin: Skin color, texture, turgor normal. No rashes or lesions Neurologic: Alert and oriented X 3, normal strength and tone. Normal symmetric reflexes. Normal coordination and gait  EKG not performed today  ASSESSMENT AND PLAN:   Critical lower limb ischemia Mr. Goyer returns today for follow-up.  He is an 85 year old gentleman with a history of PAD status post left SFA stenting by myself back in 2012.  Initially was sent to me by Dr. Dellia Nims, wound care center, for evaluation treatment of critical limb ischemia.  He underwent initial angiography 07/27/2018 revealing a calcified occluded distal right SFA with 0 vessel runoff, patent mid left SFA stent with high-grade calcified disease beyond that the 90% range with 0 vessel runoff.  Because of renal insufficiency I staged his intervention for 08/03/2018.  I performed diamondback orbital  rotational atherectomy, drug-coated balloon angioplasty of the calcified distal right SFA CTO.  He did have 0 vessel runoff in addition.  His wound continued to deteriorate and because of that he underwent staged tibial pedal access by Dr. Velva Harman 08/25/2018 with recanalization of an occluded right anterior tibial down to the foot.  He did demonstrate that the SFA/popliteal artery which I previously intervened on was widely patent.  Unfortunately, his wound continued to do poorly and he ultimately underwent right BKA by Dr. Sharol Given 08/28/2018 which is healing.  He is getting OT/PT with the intention of fitting him with a prosthesis.  He does have cellulitis in the left leg treated with antibiotics by his PCP.      Lorretta Harp MD FACP,FACC,FAHA, [facility] 10/09/2018 10:35 AM

## 2018-10-09 NOTE — Telephone Encounter (Signed)
Pt daughter presented with pt for appt on 9/25. Concerns and medications discussed during OV

## 2018-10-09 NOTE — Patient Instructions (Addendum)
Medication Instructions:  Your physician has recommended you make the following change in your medication:  RESTART YOUR ELIQUIS 2.5 MG BY MOUTH TWICE A DAY  STOP TAKING YOUR CLOPIDOGREL (PLAVIX)  If you need a refill on your cardiac medications before your next appointment, please call your pharmacy.   Lab work: NONE If you have labs (blood work) drawn today and your tests are completely normal, you will receive your results only by: [name] MyChart Message (if you have MyChart) OR . A paper copy in the mail If you have any lab test that is abnormal or we need to change your treatment, we will call you to review the results.  Testing/Procedures: Your physician has requested that you have a lower or upper extremity arterial duplex. This test is an ultrasound of the arteries in the legs or arms. It looks at arterial blood flow in the legs and arms. Allow one hour for Lower and Upper Arterial scans. There are no restrictions or special instructions DUE IN 12 MONTHS  Your physician has requested that you have an ankle brachial index (ABI). During this test an ultrasound and blood pressure cuff are used to evaluate the arteries that supply the arms and legs with blood. Allow thirty minutes for this exam. There are no restrictions or special instructions. DUE IN 12 MONTHS   Follow-Up: At [facility], you and your health needs are our priority.  As part of our continuing mission to provide you with exceptional heart care, we have created designated Provider Care Teams.  These Care Teams include your primary Cardiologist (physician) and Advanced Practice Providers (APPs -  Physician Assistants and Nurse Practitioners) who all work together to provide you with the care you need, when you need it. You will need a follow up appointment in 6 months with Dr. [name].  Please call our office 2 months in advance to schedule this/each appointment.     ADDITIONAL INFORMATION: PLEASE SCHEDULE A  FOLLOW UP APPOINTMENT WITH DR. Chrissie Noa HILTY FOR NEAR THE END OF 2020. LAST APPOINTMENT WAS November 2019.

## 2018-10-09 NOTE — Assessment & Plan Note (Signed)
[name] returns today for follow-up.  He is an 85 year old gentleman with a history of PAD status post left SFA stenting by myself back in 2012.  Initially was sent to me by Dr. Dellia Nims, wound care center, for evaluation treatment of critical limb ischemia.  He underwent initial angiography 07/27/2018 revealing a calcified occluded distal right SFA with 0 vessel runoff, patent mid left SFA stent with high-grade calcified disease beyond that the 90% range with 0 vessel runoff.  Because of renal insufficiency I staged his intervention for 08/03/2018.  I performed diamondback orbital rotational atherectomy, drug-coated balloon angioplasty of the calcified distal right SFA CTO.  He did have 0 vessel runoff in addition.  His wound continued to deteriorate and because of that he underwent staged tibial pedal access by Dr. Velva Harman 08/25/2018 with recanalization of an occluded right anterior tibial down to the foot.  He did demonstrate that the SFA/popliteal artery which I previously intervened on was widely patent.  Unfortunately, his wound continued to do poorly and he ultimately underwent right BKA by Dr. Sharol Given 08/28/2018 which is healing.  He is getting OT/PT with the intention of fitting him with a prosthesis.  He does have cellulitis in the left leg treated with antibiotics by his PCP.

## 2018-10-12 DIAGNOSIS — I509 Heart failure, unspecified: Secondary | ICD-10-CM | POA: Diagnosis not present

## 2018-10-12 DIAGNOSIS — E785 Hyperlipidemia, unspecified: Secondary | ICD-10-CM | POA: Diagnosis not present

## 2018-10-12 DIAGNOSIS — N183 Chronic kidney disease, stage 3 (moderate): Secondary | ICD-10-CM | POA: Diagnosis not present

## 2018-10-12 DIAGNOSIS — Z89511 Acquired absence of right leg below knee: Secondary | ICD-10-CM | POA: Diagnosis not present

## 2018-10-12 DIAGNOSIS — Z4801 Encounter for change or removal of surgical wound dressing: Secondary | ICD-10-CM | POA: Diagnosis not present

## 2018-10-12 DIAGNOSIS — I130 Hypertensive heart and chronic kidney disease with heart failure and stage 1 through stage 4 chronic kidney disease, or unspecified chronic kidney disease: Secondary | ICD-10-CM | POA: Diagnosis not present

## 2018-10-12 DIAGNOSIS — M1990 Unspecified osteoarthritis, unspecified site: Secondary | ICD-10-CM | POA: Diagnosis not present

## 2018-10-12 DIAGNOSIS — E039 Hypothyroidism, unspecified: Secondary | ICD-10-CM | POA: Diagnosis not present

## 2018-10-12 DIAGNOSIS — E1122 Type 2 diabetes mellitus with diabetic chronic kidney disease: Secondary | ICD-10-CM | POA: Diagnosis not present

## 2018-10-12 DIAGNOSIS — E1151 Type 2 diabetes mellitus with diabetic peripheral angiopathy without gangrene: Secondary | ICD-10-CM | POA: Diagnosis not present

## 2018-10-12 DIAGNOSIS — H409 Unspecified glaucoma: Secondary | ICD-10-CM | POA: Diagnosis not present

## 2018-10-12 DIAGNOSIS — Z4781 Encounter for orthopedic aftercare following surgical amputation: Secondary | ICD-10-CM | POA: Diagnosis not present

## 2018-10-12 DIAGNOSIS — G4730 Sleep apnea, unspecified: Secondary | ICD-10-CM | POA: Diagnosis not present

## 2018-10-13 DIAGNOSIS — I130 Hypertensive heart and chronic kidney disease with heart failure and stage 1 through stage 4 chronic kidney disease, or unspecified chronic kidney disease: Secondary | ICD-10-CM | POA: Diagnosis not present

## 2018-10-13 DIAGNOSIS — E1151 Type 2 diabetes mellitus with diabetic peripheral angiopathy without gangrene: Secondary | ICD-10-CM | POA: Diagnosis not present

## 2018-10-13 DIAGNOSIS — G4730 Sleep apnea, unspecified: Secondary | ICD-10-CM | POA: Diagnosis not present

## 2018-10-13 DIAGNOSIS — Z89511 Acquired absence of right leg below knee: Secondary | ICD-10-CM | POA: Diagnosis not present

## 2018-10-13 DIAGNOSIS — E1122 Type 2 diabetes mellitus with diabetic chronic kidney disease: Secondary | ICD-10-CM | POA: Diagnosis not present

## 2018-10-13 DIAGNOSIS — I509 Heart failure, unspecified: Secondary | ICD-10-CM | POA: Diagnosis not present

## 2018-10-13 DIAGNOSIS — M1990 Unspecified osteoarthritis, unspecified site: Secondary | ICD-10-CM | POA: Diagnosis not present

## 2018-10-13 DIAGNOSIS — Z4801 Encounter for change or removal of surgical wound dressing: Secondary | ICD-10-CM | POA: Diagnosis not present

## 2018-10-13 DIAGNOSIS — Z4781 Encounter for orthopedic aftercare following surgical amputation: Secondary | ICD-10-CM | POA: Diagnosis not present

## 2018-10-13 DIAGNOSIS — H409 Unspecified glaucoma: Secondary | ICD-10-CM | POA: Diagnosis not present

## 2018-10-13 DIAGNOSIS — E039 Hypothyroidism, unspecified: Secondary | ICD-10-CM | POA: Diagnosis not present

## 2018-10-13 DIAGNOSIS — E785 Hyperlipidemia, unspecified: Secondary | ICD-10-CM | POA: Diagnosis not present

## 2018-10-13 DIAGNOSIS — N183 Chronic kidney disease, stage 3 (moderate): Secondary | ICD-10-CM | POA: Diagnosis not present

## 2018-10-14 ENCOUNTER — Ambulatory Visit (HOSPITAL_COMMUNITY)
Admission: RE | Admit: 2018-10-14 | Discharge: 2018-10-14 | Disposition: A | Discharge status: Home / Self Care | Payer: Medicare Other | Source: Ambulatory Visit | Attending: Internal Medicine | Admitting: Internal Medicine

## 2018-10-14 ENCOUNTER — Other Ambulatory Visit: Payer: Self-pay

## 2018-10-14 DIAGNOSIS — E7849 Other hyperlipidemia: Secondary | ICD-10-CM | POA: Diagnosis not present

## 2018-10-14 DIAGNOSIS — L03116 Cellulitis of left lower limb: Secondary | ICD-10-CM | POA: Diagnosis not present

## 2018-10-14 DIAGNOSIS — I70213 Atherosclerosis of native arteries of extremities with intermittent claudication, bilateral legs: Secondary | ICD-10-CM | POA: Diagnosis not present

## 2018-10-14 DIAGNOSIS — I10 Essential (primary) hypertension: Secondary | ICD-10-CM | POA: Diagnosis not present

## 2018-10-15 ENCOUNTER — Ambulatory Visit (INDEPENDENT_AMBULATORY_CARE_PROVIDER_SITE_OTHER): Payer: Medicare Other | Admitting: Orthopedic Surgery

## 2018-10-15 ENCOUNTER — Encounter: Payer: Self-pay | Admitting: Orthopedic Surgery

## 2018-10-15 ENCOUNTER — Other Ambulatory Visit: Payer: Self-pay

## 2018-10-15 VITALS — Ht 71.0 in | Wt 220.0 lb

## 2018-10-15 DIAGNOSIS — Z89511 Acquired absence of right leg below knee: Secondary | ICD-10-CM

## 2018-10-15 NOTE — Patient Outreach (Signed)
[facility]) Care Management  10/15/2018  [name] December 23, 1927 [id number]   Medication Adherence call to [name] HIPPA Compliant Voice message left with a call back number. [name] is showing past due on Atorvastatin 40 mg and Glipizide 5 mg under [place].   [facility] Direct Dial [phone]  Fax [phone] [email or web address]

## 2018-10-16 DIAGNOSIS — Z4801 Encounter for change or removal of surgical wound dressing: Secondary | ICD-10-CM | POA: Diagnosis not present

## 2018-10-16 DIAGNOSIS — M1990 Unspecified osteoarthritis, unspecified site: Secondary | ICD-10-CM | POA: Diagnosis not present

## 2018-10-16 DIAGNOSIS — E1151 Type 2 diabetes mellitus with diabetic peripheral angiopathy without gangrene: Secondary | ICD-10-CM | POA: Diagnosis not present

## 2018-10-16 DIAGNOSIS — G4730 Sleep apnea, unspecified: Secondary | ICD-10-CM | POA: Diagnosis not present

## 2018-10-16 DIAGNOSIS — E1122 Type 2 diabetes mellitus with diabetic chronic kidney disease: Secondary | ICD-10-CM | POA: Diagnosis not present

## 2018-10-16 DIAGNOSIS — Z89511 Acquired absence of right leg below knee: Secondary | ICD-10-CM | POA: Diagnosis not present

## 2018-10-16 DIAGNOSIS — N183 Chronic kidney disease, stage 3 unspecified: Secondary | ICD-10-CM | POA: Diagnosis not present

## 2018-10-16 DIAGNOSIS — H409 Unspecified glaucoma: Secondary | ICD-10-CM | POA: Diagnosis not present

## 2018-10-16 DIAGNOSIS — E785 Hyperlipidemia, unspecified: Secondary | ICD-10-CM | POA: Diagnosis not present

## 2018-10-16 DIAGNOSIS — I130 Hypertensive heart and chronic kidney disease with heart failure and stage 1 through stage 4 chronic kidney disease, or unspecified chronic kidney disease: Secondary | ICD-10-CM | POA: Diagnosis not present

## 2018-10-16 DIAGNOSIS — Z4781 Encounter for orthopedic aftercare following surgical amputation: Secondary | ICD-10-CM | POA: Diagnosis not present

## 2018-10-16 DIAGNOSIS — I509 Heart failure, unspecified: Secondary | ICD-10-CM | POA: Diagnosis not present

## 2018-10-16 DIAGNOSIS — E039 Hypothyroidism, unspecified: Secondary | ICD-10-CM | POA: Diagnosis not present

## 2018-10-20 ENCOUNTER — Encounter: Payer: Self-pay | Admitting: Orthopedic Surgery

## 2018-10-20 DIAGNOSIS — E039 Hypothyroidism, unspecified: Secondary | ICD-10-CM | POA: Diagnosis not present

## 2018-10-20 DIAGNOSIS — I130 Hypertensive heart and chronic kidney disease with heart failure and stage 1 through stage 4 chronic kidney disease, or unspecified chronic kidney disease: Secondary | ICD-10-CM | POA: Diagnosis not present

## 2018-10-20 DIAGNOSIS — Z89511 Acquired absence of right leg below knee: Secondary | ICD-10-CM | POA: Diagnosis not present

## 2018-10-20 DIAGNOSIS — E1122 Type 2 diabetes mellitus with diabetic chronic kidney disease: Secondary | ICD-10-CM | POA: Diagnosis not present

## 2018-10-20 DIAGNOSIS — M1990 Unspecified osteoarthritis, unspecified site: Secondary | ICD-10-CM | POA: Diagnosis not present

## 2018-10-20 DIAGNOSIS — I509 Heart failure, unspecified: Secondary | ICD-10-CM | POA: Diagnosis not present

## 2018-10-20 DIAGNOSIS — H409 Unspecified glaucoma: Secondary | ICD-10-CM | POA: Diagnosis not present

## 2018-10-20 DIAGNOSIS — G4730 Sleep apnea, unspecified: Secondary | ICD-10-CM | POA: Diagnosis not present

## 2018-10-20 DIAGNOSIS — Z4781 Encounter for orthopedic aftercare following surgical amputation: Secondary | ICD-10-CM | POA: Diagnosis not present

## 2018-10-20 DIAGNOSIS — E1151 Type 2 diabetes mellitus with diabetic peripheral angiopathy without gangrene: Secondary | ICD-10-CM | POA: Diagnosis not present

## 2018-10-20 DIAGNOSIS — Z4801 Encounter for change or removal of surgical wound dressing: Secondary | ICD-10-CM | POA: Diagnosis not present

## 2018-10-20 DIAGNOSIS — E785 Hyperlipidemia, unspecified: Secondary | ICD-10-CM | POA: Diagnosis not present

## 2018-10-20 DIAGNOSIS — N183 Chronic kidney disease, stage 3 unspecified: Secondary | ICD-10-CM | POA: Diagnosis not present

## 2018-10-20 NOTE — Progress Notes (Signed)
Office Visit Note   Patient: [name]           Date of Birth: October 26, 1927           MRN: [id number] Visit Date: 10/15/2018              Requested by: [name], [address],  [place] [id number] PCP: [name], MD  Chief Complaint  Patient presents with  . Right Leg - Routine Post Op    08/28/2018 Right BKA      HPI: Patient is an 85 year old gentleman 6 weeks status post right transtibial amputation.  He states that his right leg is doing well he states he has concerns with left foot and leg swelling  Patient is following up with [facility] for prosthetic fitting.  Assessment & Plan: Visit Diagnoses:  1. Acquired absence of right leg below knee [facility])     Plan: Patient will follow-up with [facility] for prosthetic fitting recommended compression stockings for the left leg.  Follow-Up Instructions: Return in about 4 weeks (around 11/12/2018).   Ortho Exam  Patient is alert, oriented, no adenopathy, well-dressed, normal affect, normal respiratory effort. Patient has a well healed and consolidated right transtibial amputation 6 weeks out from surgery.  Patient states he had a recent urinary tract infection.  Patient has venous stasis swelling of the left foot and leg his calf is 43 cm in circumference.  Imaging: No results found. No images are attached to the encounter.  Labs: Lab Results  Component Value Date   HGBA1C 7.0 (H) 05/29/2018   HGBA1C 8.1 (H) 07/25/2017   CRP <0.8 05/29/2018   REPTSTATUS 09/30/2018 FINAL 09/28/2018   CULT >=100,000 COLONIES/mL ESCHERICHIA COLI (A) 09/28/2018   LABORGA ESCHERICHIA COLI (A) 09/28/2018     Lab Results  Component Value Date   ALBUMIN 2.5 (L) 09/08/2018   ALBUMIN 2.0 (L) 08/21/2018   ALBUMIN 2.3 (L) 08/20/2018    Lab Results  Component Value Date   MG 2.0 08/31/2018   MG 2.0 07/25/2017   No results found for: VD25OH  No results found for: PREALBUMIN CBC EXTENDED Latest Ref Rng & Units  09/21/2018 09/16/2018 09/10/2018  WBC 4.0 - 10.5 K/uL 8.5 6.2 7.3  RBC 4.22 - 5.81 MIL/uL 3.05(L) 2.96(L) 2.87(L)  HGB 13.0 - 17.0 g/dL 9.6(L) 9.4(L) 9.0(L)  HCT 39.0 - 52.0 % 29.8(L) 29.1(L) 27.5(L)  PLT 150 - 400 K/uL 239 277 274  NEUTROABS 1.7 - 7.7 K/uL 3.4 1.7 3.1  LYMPHSABS 0.7 - 4.0 K/uL 3.8 3.5 3.0     Body mass index is 30.68 kg/m.  Orders:  No orders of the defined types were placed in this encounter.  No orders of the defined types were placed in this encounter.    Procedures: No procedures performed  Clinical Data: No additional findings.  ROS:  All other systems negative, except as noted in the HPI. Review of Systems  Objective: Vital Signs: Ht 5\' 11"  (1.803 m)   Wt 220 lb (99.8 kg)   BMI 30.68 kg/m   Specialty Comments:  No specialty comments available.  PMFS History: Patient Active Problem List   Diagnosis Date Noted  . S/P BKA (below knee amputation) unilateral, right ([place]) 10/05/2018  . Hyponatremia   . Essential hypertension   . Postoperative pain   . Pain of left heel   . Unable to maintain weight-bearing   . Type 2 diabetes mellitus with diabetic peripheral angiopathy with gangrene ([place])   .  Anemia due to acute blood loss   . Stage 3 chronic kidney disease   . Acquired absence of right leg below knee ([place]) 09/07/2018  . Gangrene of right foot ([place])   . DNR (do not resuscitate) discussion   . Goals of care, counseling/discussion   . Palliative care by specialist   . Type 2 diabetes mellitus with foot ulcer and gangrene ([place])   . Severe protein-calorie malnutrition ([place])   . Ischemic foot 08/20/2018  . Critical lower limb ischemia 08/03/2018  . Type 2 diabetes mellitus with foot ulcer, without long-term current use of insulin ([place])   . Gastroesophageal reflux disease   . Cellulitis of great toe of right foot 05/29/2018  . Atrial fibrillation, chronic   . Chest pain 07/25/2017  . PAF (paroxysmal atrial fibrillation) ([place]) 07/25/2017  .  BPH (benign prostatic hyperplasia) 07/25/2017  . Shortness of breath 05/11/2017  . Hypothyroidism 05/11/2017  . Chronic diastolic CHF (congestive heart failure) ([place]) 05/11/2017  . Aortic valve stenosis 06/12/2016  . RBBB 06/12/2016  . PAD (peripheral artery disease) ([place]) 08/04/2012  . Essential hypertension, benign 08/04/2012  . Hyperlipidemia 08/04/2012  . Dizziness 08/04/2012   Past Medical History:  Diagnosis Date  . Arthritis   . Borderline diabetic   . Glaucoma   . Hyperlipidemia   . Hypertension   . PVD (peripheral vascular disease) ([place])   . Sleep apnea    Cpap ordered but doesnt use    Family History  Problem Relation Age of Onset  . Cancer Mother   . Heart attack Father   . Stroke Father   . Heart attack Brother   . Heart attack Brother     Past Surgical History:  Procedure Laterality Date  . ABDOMINAL AORTOGRAM W/LOWER EXTREMITY N/A 07/27/2018   Procedure: ABDOMINAL AORTOGRAM W/LOWER EXTREMITY;  Surgeon: Lorretta Harp, MD;  Location: [facility];  Service: Cardiovascular;  Laterality: N/A;  . AMPUTATION Right 08/28/2018   Procedure: RIGHT BELOW KNEE AMPUTATION;  Surgeon: [name], MD;  Location: [facility];  Service: Orthopedics;  Laterality: Right;  . APPENDECTOMY  1943  . CATARACT EXTRACTION  2012   x2  . COLONOSCOPY N/A 11/01/2014   Procedure: COLONOSCOPY;  Surgeon: Aviva Signs, MD;  [facility];  Service: Gastroenterology;  Laterality: N/A;  . ESOPHAGOGASTRODUODENOSCOPY N/A 11/01/2014   Procedure: ESOPHAGOGASTRODUODENOSCOPY (EGD);  Surgeon: Aviva Signs, MD;  [facility];  Service: Gastroenterology;  Laterality: N/A;  . HERNIA REPAIR Left   . INGUINAL HERNIA REPAIR Right 03/01/2013   Procedure: RIGHT INGUINAL HERNIORRHAPHY;  Surgeon: Jamesetta So, MD;  Location: [facility];  Service: General;  Laterality: Right;  . INSERTION OF MESH Right 03/01/2013   Procedure: INSERTION OF MESH;  Surgeon: Jamesetta So, MD;  Location: [facility];  Service: General;  Laterality: Right;  . LOWER EXTREMITY ANGIOGRAPHY Right 08/25/2018   Procedure: LOWER EXTREMITY ANGIOGRAPHY;  Surgeon: Wellington Hampshire, MD;  Location: [facility];  Service: Cardiovascular;  Laterality: Right;  . NM MYOCAR PERF WALL MOTION  06/05/2010   no significant ischemia  . PERIPHERAL VASCULAR ATHERECTOMY Right 08/03/2018   Procedure: PERIPHERAL VASCULAR ATHERECTOMY;  Surgeon: Lorretta Harp, MD;  Location: [facility];  Service: Cardiovascular;  Laterality: Right;  . stent  06/14/2010   left leg  . US ECHOCARDIOGRAPHY  01/21/2006   moderate mitral annular ca+, mild MR, AOV moderately sclerotic.   Social History   Occupational History  .  Not on file  Tobacco Use  . Smoking status: Former Smoker    Quit date: 01/14/1935    Years since quitting: 83.8  . Smokeless tobacco: Former Systems developer    Types: Chew  Substance and Sexual Activity  . Alcohol use: No  . Drug use: No  . Sexual activity: Not on file

## 2018-10-21 ENCOUNTER — Telehealth: Payer: Self-pay | Admitting: Orthopedic Surgery

## 2018-10-21 NOTE — Telephone Encounter (Signed)
Patient was called and scheduled for an appt for 10/27/2018 at 145 pm for wound check in left leg.

## 2018-10-21 NOTE — Telephone Encounter (Signed)
Sarah with [facility] called in said she went to go see the pt and noticed the sores on his left foot there is some pressure points starting like deep tissue injury and she is recommending her try Betadine daily to those spots and still using the sock and she will contact the pt primary about getting diabetic shoes. Please give her a call to let her know that you approve.    [phone]

## 2018-10-22 DIAGNOSIS — E1122 Type 2 diabetes mellitus with diabetic chronic kidney disease: Secondary | ICD-10-CM | POA: Diagnosis not present

## 2018-10-22 DIAGNOSIS — E1151 Type 2 diabetes mellitus with diabetic peripheral angiopathy without gangrene: Secondary | ICD-10-CM | POA: Diagnosis not present

## 2018-10-22 DIAGNOSIS — Z89511 Acquired absence of right leg below knee: Secondary | ICD-10-CM | POA: Diagnosis not present

## 2018-10-22 DIAGNOSIS — H409 Unspecified glaucoma: Secondary | ICD-10-CM | POA: Diagnosis not present

## 2018-10-22 DIAGNOSIS — I509 Heart failure, unspecified: Secondary | ICD-10-CM | POA: Diagnosis not present

## 2018-10-22 DIAGNOSIS — N183 Chronic kidney disease, stage 3 unspecified: Secondary | ICD-10-CM | POA: Diagnosis not present

## 2018-10-22 DIAGNOSIS — E039 Hypothyroidism, unspecified: Secondary | ICD-10-CM | POA: Diagnosis not present

## 2018-10-22 DIAGNOSIS — Z4781 Encounter for orthopedic aftercare following surgical amputation: Secondary | ICD-10-CM | POA: Diagnosis not present

## 2018-10-22 DIAGNOSIS — M1990 Unspecified osteoarthritis, unspecified site: Secondary | ICD-10-CM | POA: Diagnosis not present

## 2018-10-22 DIAGNOSIS — G4730 Sleep apnea, unspecified: Secondary | ICD-10-CM | POA: Diagnosis not present

## 2018-10-22 DIAGNOSIS — E785 Hyperlipidemia, unspecified: Secondary | ICD-10-CM | POA: Diagnosis not present

## 2018-10-22 DIAGNOSIS — M79672 Pain in left foot: Secondary | ICD-10-CM | POA: Diagnosis not present

## 2018-10-22 DIAGNOSIS — Z4801 Encounter for change or removal of surgical wound dressing: Secondary | ICD-10-CM | POA: Diagnosis not present

## 2018-10-22 DIAGNOSIS — I130 Hypertensive heart and chronic kidney disease with heart failure and stage 1 through stage 4 chronic kidney disease, or unspecified chronic kidney disease: Secondary | ICD-10-CM | POA: Diagnosis not present

## 2018-10-26 DIAGNOSIS — H409 Unspecified glaucoma: Secondary | ICD-10-CM | POA: Diagnosis not present

## 2018-10-26 DIAGNOSIS — I130 Hypertensive heart and chronic kidney disease with heart failure and stage 1 through stage 4 chronic kidney disease, or unspecified chronic kidney disease: Secondary | ICD-10-CM | POA: Diagnosis not present

## 2018-10-26 DIAGNOSIS — Z4801 Encounter for change or removal of surgical wound dressing: Secondary | ICD-10-CM | POA: Diagnosis not present

## 2018-10-26 DIAGNOSIS — N183 Chronic kidney disease, stage 3 unspecified: Secondary | ICD-10-CM | POA: Diagnosis not present

## 2018-10-26 DIAGNOSIS — E785 Hyperlipidemia, unspecified: Secondary | ICD-10-CM | POA: Diagnosis not present

## 2018-10-26 DIAGNOSIS — G4730 Sleep apnea, unspecified: Secondary | ICD-10-CM | POA: Diagnosis not present

## 2018-10-26 DIAGNOSIS — E1122 Type 2 diabetes mellitus with diabetic chronic kidney disease: Secondary | ICD-10-CM | POA: Diagnosis not present

## 2018-10-26 DIAGNOSIS — N390 Urinary tract infection, site not specified: Secondary | ICD-10-CM | POA: Diagnosis not present

## 2018-10-26 DIAGNOSIS — M1990 Unspecified osteoarthritis, unspecified site: Secondary | ICD-10-CM | POA: Diagnosis not present

## 2018-10-26 DIAGNOSIS — I10 Essential (primary) hypertension: Secondary | ICD-10-CM | POA: Diagnosis not present

## 2018-10-26 DIAGNOSIS — I509 Heart failure, unspecified: Secondary | ICD-10-CM | POA: Diagnosis not present

## 2018-10-26 DIAGNOSIS — Z4781 Encounter for orthopedic aftercare following surgical amputation: Secondary | ICD-10-CM | POA: Diagnosis not present

## 2018-10-26 DIAGNOSIS — E1129 Type 2 diabetes mellitus with other diabetic kidney complication: Secondary | ICD-10-CM | POA: Diagnosis not present

## 2018-10-26 DIAGNOSIS — E1151 Type 2 diabetes mellitus with diabetic peripheral angiopathy without gangrene: Secondary | ICD-10-CM | POA: Diagnosis not present

## 2018-10-26 DIAGNOSIS — R601 Generalized edema: Secondary | ICD-10-CM | POA: Diagnosis not present

## 2018-10-26 DIAGNOSIS — E039 Hypothyroidism, unspecified: Secondary | ICD-10-CM | POA: Diagnosis not present

## 2018-10-26 DIAGNOSIS — I5032 Chronic diastolic (congestive) heart failure: Secondary | ICD-10-CM | POA: Diagnosis not present

## 2018-10-26 DIAGNOSIS — Z89511 Acquired absence of right leg below knee: Secondary | ICD-10-CM | POA: Diagnosis not present

## 2018-10-27 ENCOUNTER — Ambulatory Visit (INDEPENDENT_AMBULATORY_CARE_PROVIDER_SITE_OTHER): Payer: Medicare Other | Admitting: Family

## 2018-10-27 ENCOUNTER — Encounter: Payer: Self-pay | Admitting: Family

## 2018-10-27 ENCOUNTER — Other Ambulatory Visit: Payer: Self-pay

## 2018-10-27 VITALS — Ht 71.0 in | Wt 220.0 lb

## 2018-10-27 DIAGNOSIS — Z89511 Acquired absence of right leg below knee: Secondary | ICD-10-CM

## 2018-10-27 DIAGNOSIS — L97421 Non-pressure chronic ulcer of left heel and midfoot limited to breakdown of skin: Secondary | ICD-10-CM

## 2018-10-27 DIAGNOSIS — I998 Other disorder of circulatory system: Secondary | ICD-10-CM

## 2018-10-27 DIAGNOSIS — I70229 Atherosclerosis of native arteries of extremities with rest pain, unspecified extremity: Secondary | ICD-10-CM

## 2018-10-27 NOTE — Progress Notes (Addendum)
Office Visit Note   Patient: [name]           Date of Birth: 04/19/1927           MRN: [id number] Visit Date: 10/27/2018              Requested by: [name], [address],  [place] [id number] PCP: [name], MD  Chief Complaint  Patient presents with  . Right Leg - Routine Post Op    08/28/2018 right BKA      HPI: The patient is an 85 year old gentleman seen today for evaluation of 2 separate issues.   1. Is status post R BKA on 08/28/18. This was slow to heal but has done well. Continues to have edema. [facility] Has gotten a tighter shrinker and is due to be casted next week. Pleased with healing of the BKA.  He is eager to proceed with prosthesis set up as well as gait training.  He will do well as a K2 level ambulator once he has obtained his prosthetic and is able to complete gait training he has the desire and the stamina to proceed.  2. Now is having issues with his LLE. New ulceration over anterior ankle and tip of great toe. Has been in compression stocking on left.  Unsure how these started. No drainage. No surrounding erythema.  States follows with Dr. Gwenlyn Found, son states he thought there was no intervention available to for the LLE. Unsure in particular what was going on.   Assessment & Plan: Visit Diagnoses:  1. Acquired absence of right leg below knee ([place])   2. Critical lower limb ischemia   3. Skin ulcer of midfoot region, left, limited to breakdown of skin ([place])     Plan: follow up in office in 2 weeks. Will refer back to Dr. Gwenlyn Found for ischemic ulcers to LLE, would like his input on whether has any revasc options. Continue Vive stocking to LLE with direct skin contact. Elevation.   Pleased with healing of R bKa. Continue shrinker and advance with therapy as prosthesis is available.  He will follow with [facility] for prosthesis set up.  Call for an appointment will likely be casted soon.  Follow-Up  Instructions: Return in about 2 weeks (around 11/10/2018) for as scheduled.   Ortho Exam  Patient is alert, oriented, no adenopathy, well-dressed, normal affect, normal respiratory effort. On examination of right lower extremity has 3 scattered eschars. These were debrided. Underlying bleeding granulation. Largest ulcer is medially, is just 3 mm in diameter now. One exposed absorbable suture. This was removed.  On examination of LLE has pitting edema. No erythema, warmth or weeping. Has an anteriolateral ankle ulcer, this is 2 cm in diameter. No depth. Filled in with scant eschar and granulation. The great toe is darkened, distally, ischemic ulceration, is not open. No drainage. No surrounding erythema or maceration. Unable to palpate a dp pulse.  Imaging: No results found. No images are attached to the encounter.  Labs: Lab Results  Component Value Date   HGBA1C 7.0 (H) 05/29/2018   HGBA1C 8.1 (H) 07/25/2017   CRP <0.8 05/29/2018   REPTSTATUS 09/30/2018 FINAL 09/28/2018   CULT >=100,000 COLONIES/mL ESCHERICHIA COLI (A) 09/28/2018   LABORGA ESCHERICHIA COLI (A) 09/28/2018     Lab Results  Component Value Date   ALBUMIN 2.5 (L) 09/08/2018   ALBUMIN 2.0 (L) 08/21/2018   ALBUMIN 2.3 (L) 08/20/2018  Lab Results  Component Value Date   MG 2.0 08/31/2018   MG 2.0 07/25/2017   No results found for: VD25OH  No results found for: PREALBUMIN CBC EXTENDED Latest Ref Rng & Units 09/21/2018 09/16/2018 09/10/2018  WBC 4.0 - 10.5 K/uL 8.5 6.2 7.3  RBC 4.22 - 5.81 MIL/uL 3.05(L) 2.96(L) 2.87(L)  HGB 13.0 - 17.0 g/dL 9.6(L) 9.4(L) 9.0(L)  HCT 39.0 - 52.0 % 29.8(L) 29.1(L) 27.5(L)  PLT 150 - 400 K/uL 239 277 274  NEUTROABS 1.7 - 7.7 K/uL 3.4 1.7 3.1  LYMPHSABS 0.7 - 4.0 K/uL 3.8 3.5 3.0     Body mass index is 30.68 kg/m.  Orders:  No orders of the defined types were placed in this encounter.  No orders of the defined types were placed in this encounter.    Procedures: No  procedures performed  Clinical Data: No additional findings.  ROS:  All other systems negative, except as noted in the HPI. Review of Systems  Constitutional: Negative for chills and fever.  Cardiovascular: Positive for leg swelling.  Skin: Positive for color change and wound.    Objective: Vital Signs: Ht 5\' 11"  (1.803 m)   Wt 220 lb (99.8 kg)   BMI 30.68 kg/m   Specialty Comments:  No specialty comments available.  PMFS History: Patient Active Problem List   Diagnosis Date Noted  . S/P BKA (below knee amputation) unilateral, right ([place]) 10/05/2018  . Hyponatremia   . Essential hypertension   . Postoperative pain   . Pain of left heel   . Unable to maintain weight-bearing   . Type 2 diabetes mellitus with diabetic peripheral angiopathy with gangrene ([place])   . Anemia due to acute blood loss   . Stage 3 chronic kidney disease   . Acquired absence of right leg below knee ([place]) 09/07/2018  . Gangrene of right foot ([place])   . DNR (do not resuscitate) discussion   . Goals of care, counseling/discussion   . Palliative care by specialist   . Type 2 diabetes mellitus with foot ulcer and gangrene ([place])   . Severe protein-calorie malnutrition ([place])   . Ischemic foot 08/20/2018  . Critical lower limb ischemia 08/03/2018  . Type 2 diabetes mellitus with foot ulcer, without long-term current use of insulin ([place])   . Gastroesophageal reflux disease   . Cellulitis of great toe of right foot 05/29/2018  . Atrial fibrillation, chronic   . Chest pain 07/25/2017  . PAF (paroxysmal atrial fibrillation) ([place]) 07/25/2017  . BPH (benign prostatic hyperplasia) 07/25/2017  . Shortness of breath 05/11/2017  . Hypothyroidism 05/11/2017  . Chronic diastolic CHF (congestive heart failure) ([place]) 05/11/2017  . Aortic valve stenosis 06/12/2016  . RBBB 06/12/2016  . PAD (peripheral artery disease) ([place]) 08/04/2012  . Essential hypertension, benign 08/04/2012  . Hyperlipidemia 08/04/2012  .  Dizziness 08/04/2012   Past Medical History:  Diagnosis Date  . Arthritis   . Borderline diabetic   . Glaucoma   . Hyperlipidemia   . Hypertension   . PVD (peripheral vascular disease) ([place])   . Sleep apnea    Cpap ordered but doesnt use    Family History  Problem Relation Age of Onset  . Cancer Mother   . Heart attack Father   . Stroke Father   . Heart attack Brother   . Heart attack Brother     Past Surgical History:  Procedure Laterality Date  . ABDOMINAL AORTOGRAM W/LOWER EXTREMITY N/A 07/27/2018   Procedure: ABDOMINAL AORTOGRAM W/LOWER  EXTREMITY;  Surgeon: Lorretta Harp, MD;  Location: [facility];  Service: Cardiovascular;  Laterality: N/A;  . AMPUTATION Right 08/28/2018   Procedure: RIGHT BELOW KNEE AMPUTATION;  Surgeon: [name], MD;  Location: [facility];  Service: Orthopedics;  Laterality: Right;  . APPENDECTOMY  1943  . CATARACT EXTRACTION  2012   x2  . COLONOSCOPY N/A 11/01/2014   Procedure: COLONOSCOPY;  Surgeon: Aviva Signs, MD;  [facility];  Service: Gastroenterology;  Laterality: N/A;  . ESOPHAGOGASTRODUODENOSCOPY N/A 11/01/2014   Procedure: ESOPHAGOGASTRODUODENOSCOPY (EGD);  Surgeon: Aviva Signs, MD;  [facility];  Service: Gastroenterology;  Laterality: N/A;  . HERNIA REPAIR Left   . INGUINAL HERNIA REPAIR Right 03/01/2013   Procedure: RIGHT INGUINAL HERNIORRHAPHY;  Surgeon: Jamesetta So, MD;  [facility];  Service: General;  Laterality: Right;  . INSERTION OF MESH Right 03/01/2013   Procedure: INSERTION OF MESH;  Surgeon: Jamesetta So, MD;  [facility];  Service: General;  Laterality: Right;  . LOWER EXTREMITY ANGIOGRAPHY Right 08/25/2018   Procedure: LOWER EXTREMITY ANGIOGRAPHY;  Surgeon: Wellington Hampshire, MD;  Location: [facility];  Service: Cardiovascular;  Laterality: Right;  . NM MYOCAR PERF WALL MOTION  06/05/2010   no significant ischemia  . PERIPHERAL VASCULAR ATHERECTOMY Right 08/03/2018    Procedure: PERIPHERAL VASCULAR ATHERECTOMY;  Surgeon: Lorretta Harp, MD;  Location: [facility];  Service: Cardiovascular;  Laterality: Right;  . stent  06/14/2010   left leg  . US ECHOCARDIOGRAPHY  01/21/2006   moderate mitral annular ca+, mild MR, AOV moderately sclerotic.   Social History   Occupational History  . Not on file  Tobacco Use  . Smoking status: Former Smoker    Quit date: 01/14/1935    Years since quitting: 83.8  . Smokeless tobacco: Former Systems developer    Types: Chew  Substance and Sexual Activity  . Alcohol use: No  . Drug use: No  . Sexual activity: Not on file

## 2018-10-30 ENCOUNTER — Ambulatory Visit: Payer: Medicare Other | Admitting: Cardiovascular Disease

## 2018-10-30 ENCOUNTER — Other Ambulatory Visit: Payer: Self-pay

## 2018-10-30 ENCOUNTER — Encounter: Payer: Self-pay | Admitting: Cardiovascular Disease

## 2018-10-30 DIAGNOSIS — I70229 Atherosclerosis of native arteries of extremities with rest pain, unspecified extremity: Secondary | ICD-10-CM

## 2018-10-30 DIAGNOSIS — I998 Other disorder of circulatory system: Secondary | ICD-10-CM

## 2018-10-30 NOTE — Progress Notes (Signed)
10/30/2018 [name]   Aug 01, 1927  [id number]  Primary Physician [name], MD Primary Cardiologist: Lorretta Harp MD [name], [place]  HPI:  [name] is a 85 y.o.  accompanied by his son [name] today. He was last seenin the office  10/09/2018.Marland KitchenHe has a history of hypertension, hyperlipidemia, obstructive sleep apnea and peripheral vascular disease. I apparently stented his left SFA May 2012. He complainedof numbness in his lower extremities specifically his right foot and left leg. His Dopplers performed 8/7/2018revealed right ABI 0.82 and a left ABI of 0.84. He hadpalpable pedal pulses on exam.  He had his right toenail removed by Dr. Paulla Dolly 05/07/2018 after he had clipped it himself prior to that and and developed an infection. Since that time he has had development of gangrene and is required multiple rounds of antibiotics. He is also had worsening lower extremity edema.He had Dopplers performed 07/21/2018 revealed an ABI of 0.43 on the right with 0 vessel runoff.  I performed peripheral angiography on him 07/27/2018 via the left femoral approach revealing a patent mid to distal left SFA stent, high-grade calcified disease beyond that and 0 vessel runoff on the left as well as on the right. Also had a 99% calcified lesion in the distal right SFA/above-knee popliteal artery. Because I had used 135 cc of contrast I elected not to intervene at that time but this stage intervention for limb salvage.  I performed staged distal right SFA/popliteal intervention using orbital atherectomy and drug-coated balloon angioplasty on 08/03/2018 which was successful.  He did have 0 vessel runoff however.  Because the wound continued to deteriorate he had staged right SFA intervention by Dr. Fletcher Anon 08/25/2018 with tibial pedal access and recanalization of an occluded anterior tibial.  Unfortunately, the wound continued to deteriorate and the ultimate underwent right BKA by Dr.  Sharol Given .08/28/2018.  This is slowly healing.  He is getting OT/PT with the intent of fitting him with a prosthesis.  He comes back today for reevaluation.  He does have a eschar on the tip of his left great toe and a small wound on the dorsal aspect of his left foot.  It should be noted that at the time of his original angiogram 07/27/2018 he did have a patent stent in his mid left SFA, 90% segmental calcified disease just beyond that was 0 vessel runoff.  He does has some mild edema in his left leg as well as dependent rubor.   Current Meds  Medication Sig   acetaminophen (TYLENOL) 325 MG tablet Take 1-2 tablets (325-650 mg total) by mouth every 6 (six) hours as needed for mild pain (pain score 1-3 or temp > 100.5).   apixaban (ELIQUIS) 2.5 MG TABS tablet Take 1 tablet (2.5 mg total) by mouth 2 (two) times daily.   aspirin EC 81 MG tablet Take 81 mg by mouth daily.   atorvastatin (LIPITOR) 40 MG tablet Take 1 tablet (40 mg total) by mouth daily.   cephALEXin (KEFLEX) 500 MG capsule Take 500 mg by mouth 4 (four) times daily.   ciprofloxacin (CIPRO) 500 MG tablet Take 1 tablet by mouth 2 (two) times daily.   clindamycin (CLEOCIN) 300 MG capsule Take 1 capsule by mouth 4 (four) times daily.   docusate sodium (COLACE) 100 MG capsule Take 100 mg by mouth 2 (two) times daily as needed (for use with oxycodone to prevent constipation.).   dorzolamide-timolol (COSOPT) 22.3-6.8 MG/ML ophthalmic solution Place 1 drop into both  eyes 2 (two) times daily.   doxazosin (CARDURA) 2 MG tablet Take 1 tablet (2 mg total) by mouth at bedtime.   famotidine (PEPCID) 20 MG tablet Take 1 tablet (20 mg total) by mouth 2 (two) times daily.   finasteride (PROSCAR) 5 MG tablet Take 1 tablet (5 mg total) by mouth every evening.   furosemide (LASIX) 40 MG tablet Take 40 mg by mouth as needed for edema.   glipiZIDE (GLUCOTROL) 5 MG tablet Take 5 mg by mouth 2 (two) times daily.    levothyroxine (SYNTHROID) 50 MCG  tablet Take 1 tablet (50 mcg total) by mouth daily before breakfast.   methocarbamol (ROBAXIN) 500 MG tablet Take 1 tablet (500 mg total) by mouth every 6 (six) hours as needed for muscle spasms.   methocarbamol (ROBAXIN) 500 MG tablet Take 500 mg by mouth every 6 (six) hours as needed for muscle spasms.   nitrofurantoin (MACRODANTIN) 100 MG capsule Take 100 mg by mouth 2 (two) times daily.   nystatin cream (MYCOSTATIN) 2 (two) times daily.   Omega-3 Fatty Acids (FISH OIL PO) Take 1 capsule by mouth daily.   tamsulosin (FLOMAX) 0.4 MG CAPS capsule Take 1 capsule (0.4 mg total) by mouth 2 (two) times daily.   vitamin C (ASCORBIC ACID) 500 MG tablet Take 500 mg by mouth daily.   vitamin E 400 UNIT capsule Take 400 Units by mouth daily.     Allergies  Allergen Reactions   Iodinated Diagnostic Agents     Other reaction(s): Fever   Dye Fdc Red [Red Dye] Hives   Iodine-131 Other (See Comments)    Breakout, fever   Iohexol      Code: RASH, Desc: PT STATES HOT ALL OVER HAD TO HAVE ICE PACKS ALL OVER HIM AND DIFF BREATHING, PT REFUSED IV DYE     Social History   Socioeconomic History   Marital status: Widowed    Spouse name: Not on file   Number of children: Not on file   Years of education: Not on file   Highest education level: Not on file  Occupational History   Not on file  Social Needs   Financial resource strain: Not on file   Food insecurity    Worry: Not on file    Inability: Not on file   Transportation needs    Medical: Not on file    Non-medical: Not on file  Tobacco Use   Smoking status: Former Smoker    Quit date: 01/14/1935    Years since quitting: 83.8   Smokeless tobacco: Former Systems developer    Types: Chew  Substance and Sexual Activity   Alcohol use: No   Drug use: No   Sexual activity: Not on file  Lifestyle   Physical activity    Days per week: Not on file    Minutes per session: Not on file   Stress: Not on file  Relationships    Social connections    Talks on phone: Not on file    Gets together: Not on file    Attends religious service: Not on file    Active member of club or organization: Not on file    Attends meetings of clubs or organizations: Not on file    Relationship status: Not on file   Intimate partner violence    Fear of current or ex partner: Not on file    Emotionally abused: Not on file    Physically abused: Not on file  Forced sexual activity: Not on file  Other Topics Concern   Not on file  Social History Narrative   Not on file     Review of Systems: General: negative for chills, fever, night sweats or weight changes.  Cardiovascular: negative for chest pain, dyspnea on exertion, edema, orthopnea, palpitations, paroxysmal nocturnal dyspnea or shortness of breath Dermatological: negative for rash Respiratory: negative for cough or wheezing Urologic: negative for hematuria Abdominal: negative for nausea, vomiting, diarrhea, bright red blood per rectum, melena, or hematemesis Neurologic: negative for visual changes, syncope, or dizziness All other systems reviewed and are otherwise negative except as noted above.    Blood pressure (!) 134/54, pulse 68, temperature 97.9 F (36.6 C), height 5\' 11"  (1.803 m), weight 220 lb (99.8 kg).  General appearance: alert and no distress Neck: no adenopathy, no carotid bruit, no JVD, supple, symmetrical, trachea midline and thyroid not enlarged, symmetric, no tenderness/mass/nodules Lungs: clear to auscultation bilaterally Heart: 2/6 outflow tract murmur consistent with aortic stenosis Extremities: Right BKA, 1+ edema left lower extremity with some dependent rubor and a small gangrenous wound to tip of left toe as well as dorsum of left foot Pulses: Right BKA, absent left pedal pulses Skin: Black eschar tip of left great toe, wound dorsum of left foot Neurologic: Alert and oriented X 3, normal strength and tone. Normal symmetric reflexes. Normal  coordination and gait  EKG not performed today  ASSESSMENT AND PLAN:   Critical lower limb ischemia Mr. Penna returns today with his son [name] for evaluation of wound on his left great toe and left foot.  I saw him in the office on 10/09/2018.  He has had a right BKA 08/28/2018 by Dr. Sharol Given for nonhealing wound despite revascularization.  At the time of his initial angiogram which I performed 07/27/2018 he did have a patent stent in his mid left SFA, 90% calcified disease just beyond that with 0 vessel runoff.  He has had some edema in his left leg.  His right stump is healing well and he is being fitted with for a prosthesis next week.  He has dependent rubor and is now on a diuretic.  I suggested that he make an early appointment next week with Dr. Dellia Nims at the wound care center for aggressive local wound care of the 2 wounds on his left foot.  I suspect he needs an Teaching laboratory technician and offloading.      Lorretta Harp MD FACP,FACC,FAHA, [facility] 10/30/2018 11:24 AM

## 2018-10-30 NOTE — Assessment & Plan Note (Signed)
Mr. [name] returns today with his son [name] for evaluation of wound on his left great toe and left foot.  I saw him in the office on 10/09/2018.  He has had a right BKA 08/28/2018 by Dr. Sharol Given for nonhealing wound despite revascularization.  At the time of his initial angiogram which I performed 07/27/2018 he did have a patent stent in his mid left SFA, 90% calcified disease just beyond that with 0 vessel runoff.  He has had some edema in his left leg.  His right stump is healing well and he is being fitted with for a prosthesis next week.  He has dependent rubor and is now on a diuretic.  I suggested that he make an early appointment next week with Dr. Dellia Nims at the wound care center for aggressive local wound care of the 2 wounds on his left foot.  I suspect he needs an Teaching laboratory technician and offloading.

## 2018-10-30 NOTE — Patient Instructions (Signed)
Medication Instructions:  Your physician recommends that you continue on your current medications as directed. Please refer to the Current Medication list given to you today.  If you need a refill on your cardiac medications before your next appointment, please call your pharmacy.   Lab work: none If you have labs (blood work) drawn today and your tests are completely normal, you will receive your results only by: [name] MyChart Message (if you have MyChart) OR . A paper copy in the mail If you have any lab test that is abnormal or we need to change your treatment, we will call you to review the results.  Testing/Procedures: none  Follow-Up: At [facility], you and your health needs are our priority.  As part of our continuing mission to provide you with exceptional heart care, we have created designated Provider Care Teams.  These Care Teams include your primary Cardiologist (physician) and Advanced Practice Providers (APPs -  Physician Assistants and Nurse Practitioners) who all work together to provide you with the care you need, when you need it. You will need a follow up appointment in 6 weeks with Dr. [name].

## 2018-11-03 DIAGNOSIS — N183 Chronic kidney disease, stage 3 unspecified: Secondary | ICD-10-CM | POA: Diagnosis not present

## 2018-11-04 ENCOUNTER — Other Ambulatory Visit: Payer: Self-pay

## 2018-11-04 ENCOUNTER — Encounter (HOSPITAL_BASED_OUTPATIENT_CLINIC_OR_DEPARTMENT_OTHER): Payer: Medicare Other | Attending: Physician Assistant | Admitting: Physician Assistant

## 2018-11-04 DIAGNOSIS — E11621 Type 2 diabetes mellitus with foot ulcer: Secondary | ICD-10-CM | POA: Diagnosis not present

## 2018-11-04 DIAGNOSIS — L97329 Non-pressure chronic ulcer of left ankle with unspecified severity: Secondary | ICD-10-CM | POA: Diagnosis not present

## 2018-11-04 DIAGNOSIS — T8189XA Other complications of procedures, not elsewhere classified, initial encounter: Secondary | ICD-10-CM | POA: Diagnosis not present

## 2018-11-04 DIAGNOSIS — G4730 Sleep apnea, unspecified: Secondary | ICD-10-CM | POA: Diagnosis not present

## 2018-11-04 DIAGNOSIS — I110 Hypertensive heart disease with heart failure: Secondary | ICD-10-CM | POA: Diagnosis not present

## 2018-11-04 DIAGNOSIS — Z87891 Personal history of nicotine dependence: Secondary | ICD-10-CM | POA: Diagnosis not present

## 2018-11-04 DIAGNOSIS — L97529 Non-pressure chronic ulcer of other part of left foot with unspecified severity: Secondary | ICD-10-CM | POA: Diagnosis not present

## 2018-11-04 DIAGNOSIS — L97328 Non-pressure chronic ulcer of left ankle with other specified severity: Secondary | ICD-10-CM | POA: Insufficient documentation

## 2018-11-04 DIAGNOSIS — L97528 Non-pressure chronic ulcer of other part of left foot with other specified severity: Secondary | ICD-10-CM | POA: Diagnosis not present

## 2018-11-04 DIAGNOSIS — E1140 Type 2 diabetes mellitus with diabetic neuropathy, unspecified: Secondary | ICD-10-CM | POA: Diagnosis not present

## 2018-11-04 DIAGNOSIS — I509 Heart failure, unspecified: Secondary | ICD-10-CM | POA: Diagnosis not present

## 2018-11-04 DIAGNOSIS — I7389 Other specified peripheral vascular diseases: Secondary | ICD-10-CM | POA: Diagnosis not present

## 2018-11-09 ENCOUNTER — Encounter (HOSPITAL_COMMUNITY): Payer: Self-pay

## 2018-11-09 ENCOUNTER — Emergency Department (HOSPITAL_COMMUNITY): Payer: Medicare Other

## 2018-11-09 ENCOUNTER — Other Ambulatory Visit: Payer: Self-pay

## 2018-11-09 ENCOUNTER — Inpatient Hospital Stay (HOSPITAL_COMMUNITY)
Admission: EM | Admit: 2018-11-09 | Discharge: 2018-11-16 | DRG: 291 | Disposition: A | Discharge status: Home Health | Payer: Medicare Other | Attending: Family Medicine | Admitting: Family Medicine

## 2018-11-09 DIAGNOSIS — R4189 Other symptoms and signs involving cognitive functions and awareness: Secondary | ICD-10-CM | POA: Diagnosis present

## 2018-11-09 DIAGNOSIS — R0600 Dyspnea, unspecified: Secondary | ICD-10-CM | POA: Diagnosis not present

## 2018-11-09 DIAGNOSIS — N1831 Chronic kidney disease, stage 3a: Secondary | ICD-10-CM | POA: Diagnosis not present

## 2018-11-09 DIAGNOSIS — R600 Localized edema: Secondary | ICD-10-CM | POA: Diagnosis not present

## 2018-11-09 DIAGNOSIS — I350 Nonrheumatic aortic (valve) stenosis: Secondary | ICD-10-CM | POA: Diagnosis not present

## 2018-11-09 DIAGNOSIS — I480 Paroxysmal atrial fibrillation: Secondary | ICD-10-CM | POA: Diagnosis present

## 2018-11-09 DIAGNOSIS — E039 Hypothyroidism, unspecified: Secondary | ICD-10-CM | POA: Diagnosis not present

## 2018-11-09 DIAGNOSIS — R609 Edema, unspecified: Secondary | ICD-10-CM

## 2018-11-09 DIAGNOSIS — J189 Pneumonia, unspecified organism: Secondary | ICD-10-CM

## 2018-11-09 DIAGNOSIS — N400 Enlarged prostate without lower urinary tract symptoms: Secondary | ICD-10-CM | POA: Diagnosis present

## 2018-11-09 DIAGNOSIS — I5033 Acute on chronic diastolic (congestive) heart failure: Secondary | ICD-10-CM | POA: Diagnosis not present

## 2018-11-09 DIAGNOSIS — H409 Unspecified glaucoma: Secondary | ICD-10-CM | POA: Diagnosis present

## 2018-11-09 DIAGNOSIS — I4891 Unspecified atrial fibrillation: Secondary | ICD-10-CM | POA: Diagnosis not present

## 2018-11-09 DIAGNOSIS — G4730 Sleep apnea, unspecified: Secondary | ICD-10-CM | POA: Diagnosis present

## 2018-11-09 DIAGNOSIS — I4510 Unspecified right bundle-branch block: Secondary | ICD-10-CM | POA: Diagnosis present

## 2018-11-09 DIAGNOSIS — I129 Hypertensive chronic kidney disease with stage 1 through stage 4 chronic kidney disease, or unspecified chronic kidney disease: Secondary | ICD-10-CM | POA: Diagnosis not present

## 2018-11-09 DIAGNOSIS — Z87891 Personal history of nicotine dependence: Secondary | ICD-10-CM

## 2018-11-09 DIAGNOSIS — N390 Urinary tract infection, site not specified: Secondary | ICD-10-CM | POA: Diagnosis not present

## 2018-11-09 DIAGNOSIS — Z20828 Contact with and (suspected) exposure to other viral communicable diseases: Secondary | ICD-10-CM | POA: Diagnosis present

## 2018-11-09 DIAGNOSIS — Z888 Allergy status to other drugs, medicaments and biological substances status: Secondary | ICD-10-CM

## 2018-11-09 DIAGNOSIS — E1151 Type 2 diabetes mellitus with diabetic peripheral angiopathy without gangrene: Secondary | ICD-10-CM | POA: Diagnosis present

## 2018-11-09 DIAGNOSIS — N183 Chronic kidney disease, stage 3 unspecified: Secondary | ICD-10-CM | POA: Diagnosis not present

## 2018-11-09 DIAGNOSIS — I130 Hypertensive heart and chronic kidney disease with heart failure and stage 1 through stage 4 chronic kidney disease, or unspecified chronic kidney disease: Principal | ICD-10-CM | POA: Diagnosis present

## 2018-11-09 DIAGNOSIS — R601 Generalized edema: Secondary | ICD-10-CM | POA: Diagnosis not present

## 2018-11-09 DIAGNOSIS — Z8249 Family history of ischemic heart disease and other diseases of the circulatory system: Secondary | ICD-10-CM

## 2018-11-09 DIAGNOSIS — J90 Pleural effusion, not elsewhere classified: Secondary | ICD-10-CM | POA: Diagnosis not present

## 2018-11-09 DIAGNOSIS — K219 Gastro-esophageal reflux disease without esophagitis: Secondary | ICD-10-CM | POA: Diagnosis not present

## 2018-11-09 DIAGNOSIS — Z7984 Long term (current) use of oral hypoglycemic drugs: Secondary | ICD-10-CM

## 2018-11-09 DIAGNOSIS — Z7901 Long term (current) use of anticoagulants: Secondary | ICD-10-CM

## 2018-11-09 DIAGNOSIS — Z79899 Other long term (current) drug therapy: Secondary | ICD-10-CM

## 2018-11-09 DIAGNOSIS — E1122 Type 2 diabetes mellitus with diabetic chronic kidney disease: Secondary | ICD-10-CM | POA: Diagnosis not present

## 2018-11-09 DIAGNOSIS — I5031 Acute diastolic (congestive) heart failure: Secondary | ICD-10-CM | POA: Diagnosis not present

## 2018-11-09 DIAGNOSIS — Z79890 Hormone replacement therapy (postmenopausal): Secondary | ICD-10-CM

## 2018-11-09 DIAGNOSIS — I739 Peripheral vascular disease, unspecified: Secondary | ICD-10-CM | POA: Diagnosis not present

## 2018-11-09 DIAGNOSIS — Z7982 Long term (current) use of aspirin: Secondary | ICD-10-CM

## 2018-11-09 DIAGNOSIS — E785 Hyperlipidemia, unspecified: Secondary | ICD-10-CM | POA: Diagnosis not present

## 2018-11-09 DIAGNOSIS — R0602 Shortness of breath: Secondary | ICD-10-CM | POA: Diagnosis not present

## 2018-11-09 DIAGNOSIS — K5730 Diverticulosis of large intestine without perforation or abscess without bleeding: Secondary | ICD-10-CM | POA: Diagnosis not present

## 2018-11-09 DIAGNOSIS — Z91041 Radiographic dye allergy status: Secondary | ICD-10-CM

## 2018-11-09 DIAGNOSIS — E876 Hypokalemia: Secondary | ICD-10-CM | POA: Diagnosis not present

## 2018-11-09 DIAGNOSIS — Z91128 Patient's intentional underdosing of medication regimen for other reason: Secondary | ICD-10-CM

## 2018-11-09 DIAGNOSIS — Z9102 Food additives allergy status: Secondary | ICD-10-CM

## 2018-11-09 DIAGNOSIS — Z66 Do not resuscitate: Secondary | ICD-10-CM | POA: Diagnosis present

## 2018-11-09 DIAGNOSIS — T501X6A Underdosing of loop [high-ceiling] diuretics, initial encounter: Secondary | ICD-10-CM | POA: Diagnosis present

## 2018-11-09 DIAGNOSIS — Z89511 Acquired absence of right leg below knee: Secondary | ICD-10-CM

## 2018-11-09 LAB — CBC WITH DIFFERENTIAL/PLATELET
Abs Immature Granulocytes: 0.03 10*3/uL (ref 0.00–0.07)
Basophils Absolute: 0.1 10*3/uL (ref 0.0–0.1)
Basophils Relative: 1 %
Eosinophils Absolute: 0.4 10*3/uL (ref 0.0–0.5)
Eosinophils Relative: 4 %
HCT: 31.1 % — ABNORMAL LOW (ref 39.0–52.0)
Hemoglobin: 10.3 g/dL — ABNORMAL LOW (ref 13.0–17.0)
Immature Granulocytes: 0 %
Lymphocytes Relative: 25 %
Lymphs Abs: 2.6 10*3/uL (ref 0.7–4.0)
MCH: 30.8 pg (ref 26.0–34.0)
MCHC: 33.1 g/dL (ref 30.0–36.0)
MCV: 93.1 fL (ref 80.0–100.0)
Monocytes Absolute: 1 10*3/uL (ref 0.1–1.0)
Monocytes Relative: 9 %
Neutro Abs: 6.4 10*3/uL (ref 1.7–7.7)
Neutrophils Relative %: 61 %
Platelets: 226 10*3/uL (ref 150–400)
RBC: 3.34 MIL/uL — ABNORMAL LOW (ref 4.22–5.81)
RDW: 15 % (ref 11.5–15.5)
WBC: 10.4 10*3/uL (ref 4.0–10.5)
nRBC: 0 % (ref 0.0–0.2)

## 2018-11-09 LAB — GLUCOSE, CAPILLARY: Glucose-Capillary: 100 mg/dL — ABNORMAL HIGH (ref 70–99)

## 2018-11-09 LAB — COMPREHENSIVE METABOLIC PANEL
ALT: 21 U/L (ref 0–44)
AST: 32 U/L (ref 15–41)
Albumin: 3.2 g/dL — ABNORMAL LOW (ref 3.5–5.0)
Alkaline Phosphatase: 39 U/L (ref 38–126)
Anion gap: 13 (ref 5–15)
BUN: 28 mg/dL — ABNORMAL HIGH (ref 8–23)
CO2: 27 mmol/L (ref 22–32)
Calcium: 8.7 mg/dL — ABNORMAL LOW (ref 8.9–10.3)
Chloride: 90 mmol/L — ABNORMAL LOW (ref 98–111)
Creatinine, Ser: 1.29 mg/dL — ABNORMAL HIGH (ref 0.61–1.24)
GFR calc Af Amer: 57 mL/min — ABNORMAL LOW (ref >60)
GFR calc non Af Amer: 49 mL/min — ABNORMAL LOW (ref >60)
Glucose, Bld: 77 mg/dL (ref 70–99)
Potassium: 3.2 mmol/L — ABNORMAL LOW (ref 3.5–5.1)
Sodium: 130 mmol/L — ABNORMAL LOW (ref 135–145)
Total Bilirubin: 1 mg/dL (ref 0.3–1.2)
Total Protein: 6.7 g/dL (ref 6.5–8.1)

## 2018-11-09 LAB — SARS CORONAVIRUS 2 (TAT 6-24 HRS): SARS Coronavirus 2: NEGATIVE

## 2018-11-09 LAB — PROCALCITONIN: Procalcitonin: <0.1 ng/mL

## 2018-11-09 LAB — BRAIN NATRIURETIC PEPTIDE: B Natriuretic Peptide: 258 pg/mL — ABNORMAL HIGH (ref 0.0–100.0)

## 2018-11-09 LAB — TROPONIN I (HIGH SENSITIVITY): Troponin I (High Sensitivity): 14 ng/L (ref <18)

## 2018-11-09 MED ORDER — VITAMIN C 500 MG PO TABS
500.0000 mg | ORAL_TABLET | Freq: Every day | ORAL | Status: DC
  Administered 2018-11-09 – 2018-11-16 (×8): 500 mg via ORAL
  Filled 2018-11-09 (×8): qty 1

## 2018-11-09 MED ORDER — SODIUM CHLORIDE 0.9% FLUSH
3.0000 mL | Freq: Two times a day (BID) | INTRAVENOUS | Status: DC
  Administered 2018-11-09 – 2018-11-16 (×14): 3 mL via INTRAVENOUS

## 2018-11-09 MED ORDER — FUROSEMIDE 10 MG/ML IJ SOLN
40.0000 mg | Freq: Two times a day (BID) | INTRAMUSCULAR | Status: DC
  Administered 2018-11-09 – 2018-11-11 (×4): 40 mg via INTRAVENOUS
  Filled 2018-11-09 (×4): qty 4

## 2018-11-09 MED ORDER — SODIUM CHLORIDE 0.9% FLUSH: 3.0000 mL | INTRAVENOUS | Status: DC | PRN

## 2018-11-09 MED ORDER — ONDANSETRON HCL 4 MG/2ML IJ SOLN: 4.0000 mg | Freq: Four times a day (QID) | INTRAMUSCULAR | Status: DC | PRN

## 2018-11-09 MED ORDER — ATORVASTATIN CALCIUM 40 MG PO TABS
40.0000 mg | ORAL_TABLET | Freq: Every day | ORAL | Status: DC
  Administered 2018-11-09 – 2018-11-16 (×8): 40 mg via ORAL
  Filled 2018-11-09 (×8): qty 1

## 2018-11-09 MED ORDER — SODIUM CHLORIDE 0.9 % IV SOLN: 250.0000 mL | INTRAVENOUS | Status: DC | PRN

## 2018-11-09 MED ORDER — ACETAMINOPHEN 325 MG PO TABS
650.0000 mg | ORAL_TABLET | ORAL | Status: DC | PRN
  Administered 2018-11-11: 650 mg via ORAL
  Filled 2018-11-09: qty 2

## 2018-11-09 MED ORDER — VITAMIN E 180 MG (400 UNIT) PO CAPS
400.0000 [IU] | ORAL_CAPSULE | Freq: Every day | ORAL | Status: DC
  Administered 2018-11-09 – 2018-11-16 (×8): 400 [IU] via ORAL
  Filled 2018-11-09 (×8): qty 1

## 2018-11-09 MED ORDER — FINASTERIDE 5 MG PO TABS
5.0000 mg | ORAL_TABLET | Freq: Every evening | ORAL | Status: DC
  Administered 2018-11-09 – 2018-11-15 (×7): 5 mg via ORAL
  Filled 2018-11-09 (×7): qty 1

## 2018-11-09 MED ORDER — DOXAZOSIN MESYLATE 2 MG PO TABS
2.0000 mg | ORAL_TABLET | Freq: Every day | ORAL | Status: DC
  Administered 2018-11-09 – 2018-11-15 (×7): 2 mg via ORAL
  Filled 2018-11-09 (×7): qty 1

## 2018-11-09 MED ORDER — FAMOTIDINE 20 MG PO TABS
20.0000 mg | ORAL_TABLET | Freq: Every day | ORAL | Status: DC
  Administered 2018-11-09 – 2018-11-16 (×8): 20 mg via ORAL
  Filled 2018-11-09 (×8): qty 1

## 2018-11-09 MED ORDER — ASPIRIN EC 81 MG PO TBEC
81.0000 mg | DELAYED_RELEASE_TABLET | Freq: Every day | ORAL | Status: DC
  Administered 2018-11-09 – 2018-11-16 (×8): 81 mg via ORAL
  Filled 2018-11-09 (×8): qty 1

## 2018-11-09 MED ORDER — APIXABAN 2.5 MG PO TABS
2.5000 mg | ORAL_TABLET | Freq: Two times a day (BID) | ORAL | Status: DC
  Administered 2018-11-09 – 2018-11-10 (×2): 2.5 mg via ORAL
  Filled 2018-11-09 (×2): qty 1

## 2018-11-09 MED ORDER — SODIUM CHLORIDE 0.9 % IV SOLN
500.0000 mg | INTRAVENOUS | Status: DC
  Administered 2018-11-09 – 2018-11-10 (×2): 500 mg via INTRAVENOUS
  Filled 2018-11-09 (×2): qty 500

## 2018-11-09 MED ORDER — FUROSEMIDE 10 MG/ML IJ SOLN
40.0000 mg | Freq: Once | INTRAMUSCULAR | Status: AC
  Administered 2018-11-09: 40 mg via INTRAVENOUS
  Filled 2018-11-09: qty 4

## 2018-11-09 MED ORDER — DORZOLAMIDE HCL-TIMOLOL MAL 2-0.5 % OP SOLN
1.0000 [drp] | Freq: Two times a day (BID) | OPHTHALMIC | Status: DC
  Administered 2018-11-09 – 2018-11-16 (×14): 1 [drp] via OPHTHALMIC
  Filled 2018-11-09 (×2): qty 10

## 2018-11-09 MED ORDER — TAMSULOSIN HCL 0.4 MG PO CAPS
0.4000 mg | ORAL_CAPSULE | Freq: Two times a day (BID) | ORAL | Status: DC
  Administered 2018-11-09 – 2018-11-16 (×14): 0.4 mg via ORAL
  Filled 2018-11-09 (×14): qty 1

## 2018-11-09 MED ORDER — SODIUM CHLORIDE 0.9 % IV SOLN
1.0000 g | Freq: Once | INTRAVENOUS | Status: AC
  Administered 2018-11-09: 1 g via INTRAVENOUS
  Filled 2018-11-09: qty 10

## 2018-11-09 MED ORDER — LEVOTHYROXINE SODIUM 50 MCG PO TABS
50.0000 ug | ORAL_TABLET | Freq: Every day | ORAL | Status: DC
  Administered 2018-11-11 – 2018-11-16 (×6): 50 ug via ORAL
  Filled 2018-11-09 (×8): qty 1

## 2018-11-09 MED ORDER — INSULIN ASPART 100 UNIT/ML ~~LOC~~ SOLN
0.0000 [IU] | Freq: Three times a day (TID) | SUBCUTANEOUS | Status: DC
  Administered 2018-11-10 (×2): 1 [IU] via SUBCUTANEOUS
  Administered 2018-11-11 (×2): 2 [IU] via SUBCUTANEOUS
  Administered 2018-11-11 – 2018-11-12 (×2): 1 [IU] via SUBCUTANEOUS
  Administered 2018-11-12: 2 [IU] via SUBCUTANEOUS
  Administered 2018-11-12: 1 [IU] via SUBCUTANEOUS
  Administered 2018-11-13 (×2): 2 [IU] via SUBCUTANEOUS
  Administered 2018-11-14: 1 [IU] via SUBCUTANEOUS
  Administered 2018-11-14 – 2018-11-15 (×2): 2 [IU] via SUBCUTANEOUS
  Administered 2018-11-15 – 2018-11-16 (×3): 1 [IU] via SUBCUTANEOUS

## 2018-11-09 NOTE — ED Provider Notes (Signed)
[facility] EMERGENCY DEPARTMENT Provider Note   CSN: [id number] Arrival date & time: 11/09/18  1017     History   Chief Complaint Chief Complaint  Patient presents with  . Groin Swelling  . Leg Swelling    HPI [name] is a 85 y.o. male.     Level 5 caveat for cognitive impairment.  Chief complaint: peripheral edema.  Most of history obtained from son and Dr. Gerarda Fraction.  Patient has multiple health problems well-documented in the past medical history including tight aortic stenosis, right BKA, hypertension, type 2 diabetes, hyperlipidemia, many others.  Son reports worsening peripheral edema which she has advanced to his lower abdomen.  He is supposed to take Lasix 40 mg 3 times a day, but is only been able to take 2 doses per day.  Slight dyspnea.  No substernal chest pain.  No obvious Covid exposures.     Past Medical History:  Diagnosis Date  . Arthritis   . Borderline diabetic   . Glaucoma   . Hyperlipidemia   . Hypertension   . PVD (peripheral vascular disease) ([place])   . Sleep apnea    Cpap ordered but doesnt use    Patient Active Problem List   Diagnosis Date Noted  . S/P BKA (below knee amputation) unilateral, right ([place]) 10/05/2018  . Hyponatremia   . Essential hypertension   . Postoperative pain   . Pain of left heel   . Unable to maintain weight-bearing   . Type 2 diabetes mellitus with diabetic peripheral angiopathy with gangrene ([place])   . Anemia due to acute blood loss   . Stage 3 chronic kidney disease   . Acquired absence of right leg below knee ([place]) 09/07/2018  . Gangrene of right foot ([place])   . DNR (do not resuscitate) discussion   . Goals of care, counseling/discussion   . Palliative care by specialist   . Type 2 diabetes mellitus with foot ulcer and gangrene ([place])   . Severe protein-calorie malnutrition ([place])   . Ischemic foot 08/20/2018  . Critical lower limb ischemia 08/03/2018  . Type 2 diabetes mellitus with foot ulcer, without  long-term current use of insulin ([place])   . Gastroesophageal reflux disease   . Cellulitis of great toe of right foot 05/29/2018  . Atrial fibrillation, chronic   . Chest pain 07/25/2017  . PAF (paroxysmal atrial fibrillation) ([place]) 07/25/2017  . BPH (benign prostatic hyperplasia) 07/25/2017  . Shortness of breath 05/11/2017  . Hypothyroidism 05/11/2017  . Chronic diastolic CHF (congestive heart failure) ([place]) 05/11/2017  . Aortic valve stenosis 06/12/2016  . RBBB 06/12/2016  . PAD (peripheral artery disease) ([place]) 08/04/2012  . Essential hypertension, benign 08/04/2012  . Hyperlipidemia 08/04/2012  . Dizziness 08/04/2012    Past Surgical History:  Procedure Laterality Date  . ABDOMINAL AORTOGRAM W/LOWER EXTREMITY N/A 07/27/2018   Procedure: ABDOMINAL AORTOGRAM W/LOWER EXTREMITY;  Surgeon: Lorretta Harp, MD;  Location: [place] [facility];  Service: Cardiovascular;  Laterality: N/A;  . AMPUTATION Right 08/28/2018   Procedure: RIGHT BELOW KNEE AMPUTATION;  Surgeon: [name], MD;  Location: [place];  Service: Orthopedics;  Laterality: Right;  . APPENDECTOMY  1943  . CATARACT EXTRACTION  2012   x2  . COLONOSCOPY N/A 11/01/2014   Procedure: COLONOSCOPY;  Surgeon: Aviva Signs, MD;  [facility];  Service: Gastroenterology;  Laterality: N/A;  . ESOPHAGOGASTRODUODENOSCOPY N/A 11/01/2014   Procedure: ESOPHAGOGASTRODUODENOSCOPY (EGD);  Surgeon: Aviva Signs, MD;  [facility];  Service: Gastroenterology;  Laterality: N/A;  . HERNIA REPAIR Left   . INGUINAL HERNIA REPAIR Right 03/01/2013   Procedure: RIGHT INGUINAL HERNIORRHAPHY;  Surgeon: Jamesetta So, MD;  Location: [facility];  Service: General;  Laterality: Right;  . INSERTION OF MESH Right 03/01/2013   Procedure: INSERTION OF MESH;  Surgeon: Jamesetta So, MD;  Location: [facility];  Service: General;  Laterality: Right;  . LOWER EXTREMITY ANGIOGRAPHY Right 08/25/2018   Procedure: LOWER EXTREMITY ANGIOGRAPHY;   Surgeon: Wellington Hampshire, MD;  Location: [place] [facility];  Service: Cardiovascular;  Laterality: Right;  . NM MYOCAR PERF WALL MOTION  06/05/2010   no significant ischemia  . PERIPHERAL VASCULAR ATHERECTOMY Right 08/03/2018   Procedure: PERIPHERAL VASCULAR ATHERECTOMY;  Surgeon: Lorretta Harp, MD;  Location: [place] [facility];  Service: Cardiovascular;  Laterality: Right;  . stent  06/14/2010   left leg  . US ECHOCARDIOGRAPHY  01/21/2006   moderate mitral annular ca+, mild MR, AOV moderately sclerotic.        Home Medications    Prior to Admission medications   Medication Sig Start Date End Date Taking? Authorizing Provider  acetaminophen (TYLENOL) 325 MG tablet Take 1-2 tablets (325-650 mg total) by mouth every 6 (six) hours as needed for mild pain (pain score 1-3 or temp > 100.5). 09/22/18   Angiulli, Lavon Paganini, PA-C  apixaban (ELIQUIS) 2.5 MG TABS tablet Take 1 tablet (2.5 mg total) by mouth 2 (two) times daily. 10/09/18   Lorretta Harp, MD  aspirin EC 81 MG tablet Take 81 mg by mouth daily.    [provider]  atorvastatin (LIPITOR) 40 MG tablet Take 1 tablet (40 mg total) by mouth daily. 09/22/18   Angiulli, Lavon Paganini, PA-C  cephALEXin (KEFLEX) 500 MG capsule Take 500 mg by mouth 4 (four) times daily.    [provider]  ciprofloxacin (CIPRO) 500 MG tablet Take 1 tablet by mouth 2 (two) times daily. 10/08/18   [provider]  clindamycin (CLEOCIN) 300 MG capsule Take 1 capsule by mouth 4 (four) times daily. 10/08/18   [provider]  docusate sodium (COLACE) 100 MG capsule Take 100 mg by mouth 2 (two) times daily as needed (for use with oxycodone to prevent constipation.).    [provider]  dorzolamide-timolol (COSOPT) 22.3-6.8 MG/ML ophthalmic solution Place 1 drop into both eyes 2 (two) times daily.    [provider]  doxazosin (CARDURA) 2 MG tablet Take 1 tablet (2 mg total) by mouth at bedtime. 09/22/18   Angiulli, Lavon Paganini, PA-C  famotidine (PEPCID) 20 MG tablet Take 1 tablet (20 mg total) by mouth 2 (two) times daily. 09/22/18   Angiulli, Lavon Paganini, PA-C  finasteride (PROSCAR) 5 MG tablet Take 1 tablet (5 mg total) by mouth every evening. 09/22/18   Angiulli, Lavon Paganini, PA-C  furosemide (LASIX) 40 MG tablet Take 40 mg by mouth as needed for edema.    [provider]  glipiZIDE (GLUCOTROL) 5 MG tablet Take 5 mg by mouth 2 (two) times daily.     [provider]  levothyroxine (SYNTHROID) 50 MCG tablet Take 1 tablet (50 mcg total) by mouth daily before breakfast. 09/22/18   Angiulli, Lavon Paganini, PA-C  methocarbamol (ROBAXIN) 500 MG tablet Take 1 tablet (500 mg total) by mouth every 6 (six) hours as needed for muscle spasms. 09/22/18   Angiulli, Lavon Paganini, PA-C  methocarbamol (ROBAXIN) 500 MG tablet Take 500 mg by  mouth every 6 (six) hours as needed for muscle spasms.    [provider]  nitrofurantoin (MACRODANTIN) 100 MG capsule Take 100 mg by mouth 2 (two) times daily.    [provider]  nystatin cream (MYCOSTATIN) 2 (two) times daily. 10/08/18   [provider]  Omega-3 Fatty Acids (FISH OIL PO) Take 1 capsule by mouth daily.    [provider]  tamsulosin (FLOMAX) 0.4 MG CAPS capsule Take 1 capsule (0.4 mg total) by mouth 2 (two) times daily. 09/22/18   Angiulli, Lavon Paganini, PA-C  vitamin C (ASCORBIC ACID) 500 MG tablet Take 500 mg by mouth daily.    [provider]  vitamin E 400 UNIT capsule Take 400 Units by mouth daily.    [provider]    Family History Family History  Problem Relation Age of Onset  . Cancer Mother   . Heart attack Father   . Stroke Father   . Heart attack Brother   . Heart attack Brother     Social History Social History   Tobacco Use  . Smoking status: Former Smoker    Quit date: 01/14/1935    Years since quitting: 83.8  . Smokeless tobacco: Former Systems developer    Types: Chew  Substance Use Topics  . Alcohol use: No  .  Drug use: No     Allergies   Iodinated diagnostic agents, Dye fdc red [red dye], Iodine-131, and Iohexol   Review of Systems Review of Systems  Unable to perform ROS: Other   Cognitive impairment  Physical Exam Updated Vital Signs BP (!) 144/62   Pulse 64   Temp 97.7 F (36.5 C) (Oral)   Resp 13   Ht 5\' 11"  (1.803 m)   Wt 99 kg   SpO2 98%   BMI 30.44 kg/m   Physical Exam Vitals signs and nursing note reviewed.  Constitutional:      Appearance: He is well-developed.     Comments: nad  HENT:     Head: Normocephalic and atraumatic.  Eyes:     Conjunctiva/sclera: Conjunctivae normal.  Neck:     Musculoskeletal: Neck supple.  Cardiovascular:     Rate and Rhythm: Normal rate and regular rhythm.  Pulmonary:     Effort: Pulmonary effort is normal.     Breath sounds: Normal breath sounds.  Abdominal:     General: Bowel sounds are normal.     Palpations: Abdomen is soft.  Musculoskeletal: Normal range of motion.     Comments: Right BKA.  Skin:    General: Skin is warm and dry.     Comments: 3+ peripheral edema noted with questionable extension to the abdomen.  Left foot: Necrotic tip of great toe.  Distal dorsal two thirds of the foot is erythematous.  Neurological:     General: No focal deficit present.     Mental Status: He is alert and oriented to person, place, and time.  Psychiatric:        Behavior: Behavior normal.      ED Treatments / Results  Labs (all labs ordered are listed, but only abnormal results are displayed) Labs Reviewed  CBC WITH DIFFERENTIAL/PLATELET  COMPREHENSIVE METABOLIC PANEL  BRAIN NATRIURETIC PEPTIDE  TROPONIN I (HIGH SENSITIVITY)    EKG None  Radiology No results found.  Procedures Procedures (including critical care time)  Medications Ordered in ED Medications  furosemide (LASIX) injection 40 mg (has no administration in time range)     Initial Impression /  Assessment and Plan / ED Course  I have reviewed the  triage vital signs and the nursing notes.  Pertinent labs & imaging results that were available during my care of the patient were reviewed by me and considered in my medical decision making (see chart for details).       Likely mild heart failure.  Screening labs, chest x-ray, EKG, IV Lasix.  Probable admission.   Final Clinical Impressions(s) / ED Diagnoses   Final diagnoses:  Peripheral edema    ED Discharge Orders    None       Nat Christen, MD 11/09/18 1137

## 2018-11-09 NOTE — H&P (Signed)
History and Physical  [name] NAT:[id number] DOB: [birth year] DOA: 11/09/2018   PCP: Redmond School, MD   Patient coming from: Home  Chief Complaint: anasarca  HPI:  [name] is a 85 y.o. male with medical history of diabetes mellitus type 2, diastolic CHF, CKD stage III, hypertension, right BKA, paroxysmal atrial fibrillation, peripheral arterial disease presenting with increasing lower extremity edema extending to his groin, scrotum, and lower abdomen.  The patient normally has lower extreme edema, but the son states that it has significantly worsened in the past week.  Patient has had some intermittent shortness of breath, but states that this is not any worse than usual.  He denies any fever, chills, chest pain, coughing, hemoptysis, nausea vomiting, diarrhea, abdominal pain, dysuria, hematuria.  According to the patient's son, the patient is supposed to be taking furosemide 40 mg twice daily, but he only is able to take it twice a day for 3 to 4 days out of the usual week because of his numerous doctors appointments and other commitments.  Apparently, his furosemide was recently increased to 40 mg 3 times daily, but the patient has not been taking it this frequently as discussed above.  Son states that they eat out approximately 2 times per week.  There is no flow restriction.  The patient does not weigh himself.  He went to see his primary care provider today.  Because of concerns of worsening edema and anasarca, the patient was sent to emergency department for further evaluation. In the emergency department, the patient was afebrile hemodynamically stable with oxygen saturation 96-90% on room air.  BMP showed a potassium 3.2, sodium 130, normal LFTs peer WBC was 10.4 with hemoglobin 10.3, platelets 226,000.  Chest x-ray showed increased interstitial markings, low volumes, small bilateral pleural effusions.  The patient was started IV furosemide.  Assessment/Plan: Acute on  chronic diastolic CHF -Patient presents with decompensation and anasarca -Start furosemide 40 mg IV twice daily -Daily weights -08/27/2018 echo EF 55 to 60%, impaired relaxation, moderate aortic stenosis -Accurate I's and O's -Urine protein creatinine ratio  Peripheral arterial disease -Patient had right BKA secondary to critical limb ischemia 08/28/2018 -I had concern regarding the patient's left foot coolness and discoloration -Case was discussed with Dr. Donnella Bi who has been following the patient's peripheral arterial disease--> no urgent intervention necessary at this time unless the patient's condition changes -Continue apixaban and aspirin  Paroxysmal atrial fibrillation -Rate controlled -Continue apixaban  CKD stage III -Baseline creatinine 1.0-1.3 -Monitor with diuresis  Diabetes mellitus type 2, controlled -Holding glipizide -Check hemoglobin A1c -05/29/2018 hemoglobin A1c 7.0 -NovoLog sliding scale  Hypothyroidism -Continue Synthroid  Hyperlipidemia -Continue statin  Hypokalemia -Replete -Check magnesium       Past Medical History:  Diagnosis Date  . Arthritis   . Borderline diabetic   . Glaucoma   . Hyperlipidemia   . Hypertension   . PVD (peripheral vascular disease) ([place])   . Sleep apnea    Cpap ordered but doesnt use   Past Surgical History:  Procedure Laterality Date  . ABDOMINAL AORTOGRAM W/LOWER EXTREMITY N/A 07/27/2018   Procedure: ABDOMINAL AORTOGRAM W/LOWER EXTREMITY;  Surgeon: Lorretta Harp, MD;  Location: [place] [facility];  Service: Cardiovascular;  Laterality: N/A;  . AMPUTATION Right 08/28/2018   Procedure: RIGHT BELOW KNEE AMPUTATION;  Surgeon: [name], MD;  Location: [place];  Service: Orthopedics;  Laterality: Right;  . APPENDECTOMY  1943  . CATARACT EXTRACTION  2012   x2  . COLONOSCOPY N/A 11/01/2014   Procedure: COLONOSCOPY;  Surgeon: Aviva Signs, MD;  [facility];  Service: Gastroenterology;   Laterality: N/A;  . ESOPHAGOGASTRODUODENOSCOPY N/A 11/01/2014   Procedure: ESOPHAGOGASTRODUODENOSCOPY (EGD);  Surgeon: Aviva Signs, MD;  [facility];  Service: Gastroenterology;  Laterality: N/A;  . HERNIA REPAIR Left   . INGUINAL HERNIA REPAIR Right 03/01/2013   Procedure: RIGHT INGUINAL HERNIORRHAPHY;  Surgeon: Jamesetta So, MD;  Location[facility];  Service: General;  Laterality: Right;  . INSERTION OF MESH Right 03/01/2013   Procedure: INSERTION OF MESH;  Surgeon: Jamesetta So, MD;  [facility];  Service: General;  Laterality: Right;  . LOWER EXTREMITY ANGIOGRAPHY Right 08/25/2018   Procedure: LOWER EXTREMITY ANGIOGRAPHY;  Surgeon: Wellington Hampshire, MD;  Location: [place] [facility];  Service: Cardiovascular;  Laterality: Right;  . NM MYOCAR PERF WALL MOTION  06/05/2010   no significant ischemia  . PERIPHERAL VASCULAR ATHERECTOMY Right 08/03/2018   Procedure: PERIPHERAL VASCULAR ATHERECTOMY;  Surgeon: Lorretta Harp, MD;  Location: [place] [facility];  Service: Cardiovascular;  Laterality: Right;  . stent  06/14/2010   left leg  . US ECHOCARDIOGRAPHY  01/21/2006   moderate mitral annular ca+, mild MR, AOV moderately sclerotic.   Social History:  reports that he quit smoking about 83 years ago. He has quit using smokeless tobacco.  His smokeless tobacco use included chew. He reports that he does not drink alcohol or use drugs.   Family History  Problem Relation Age of Onset  . Cancer Mother   . Heart attack Father   . Stroke Father   . Heart attack Brother   . Heart attack Brother      Allergies  Allergen Reactions  . Iodinated Diagnostic Agents     Other reaction(s): Fever  . Dye Fdc Red [Red Dye] Hives  . Iodine-131 Other (See Comments)    Breakout, fever  . Iohexol      Code: RASH, Desc: PT STATES HOT ALL OVER HAD TO HAVE ICE PACKS ALL OVER HIM AND DIFF BREATHING, PT REFUSED IV DYE      Prior to Admission medications   Medication Sig Start Date End  Date Taking? Authorizing Provider  acetaminophen (TYLENOL) 325 MG tablet Take 1-2 tablets (325-650 mg total) by mouth every 6 (six) hours as needed for mild pain (pain score 1-3 or temp > 100.5). 09/22/18  Yes Angiulli, Lavon Paganini, PA-C  apixaban (ELIQUIS) 2.5 MG TABS tablet Take 1 tablet (2.5 mg total) by mouth 2 (two) times daily. 10/09/18  Yes Lorretta Harp, MD  aspirin EC 81 MG tablet Take 81 mg by mouth daily.   Yes [provider]  atorvastatin (LIPITOR) 40 MG tablet Take 1 tablet (40 mg total) by mouth daily. 09/22/18  Yes Angiulli, Lavon Paganini, PA-C  dorzolamide-timolol (COSOPT) 22.3-6.8 MG/ML ophthalmic solution Place 1 drop into both eyes 2 (two) times daily.   Yes [provider]  doxazosin (CARDURA) 2 MG tablet Take 1 tablet (2 mg total) by mouth at bedtime. 09/22/18  Yes Angiulli, Lavon Paganini, PA-C  famotidine (PEPCID) 20 MG tablet Take 1 tablet (20 mg total) by mouth 2 (two) times daily. 09/22/18  Yes Angiulli, Lavon Paganini, PA-C  finasteride (PROSCAR) 5 MG tablet Take 1 tablet (5 mg total) by mouth every evening. 09/22/18  Yes Angiulli, Lavon Paganini, PA-C  furosemide (LASIX) 40 MG tablet Take 40 mg by mouth 2 (two) times  daily as needed for edema.    Yes [provider]  glipiZIDE (GLUCOTROL) 5 MG tablet Take 5 mg by mouth 2 (two) times daily.    Yes [provider]  levothyroxine (SYNTHROID) 50 MCG tablet Take 1 tablet (50 mcg total) by mouth daily before breakfast. 09/22/18  Yes Angiulli, Lavon Paganini, PA-C  nystatin cream (MYCOSTATIN) Apply 1 application topically 2 (two) times daily as needed.  10/08/18  Yes [provider]  Omega-3 Fatty Acids (FISH OIL PO) Take 1 capsule by mouth daily.   Yes [provider]  povidone-Iodine (BETADINE) 5 % SOLN topical solution Apply 1 application topically daily as needed for wound care.   Yes [provider]  SANTYL ointment Apply 1 application topically daily.  11/06/18  Yes [provider]  tamsulosin  (FLOMAX) 0.4 MG CAPS capsule Take 1 capsule (0.4 mg total) by mouth 2 (two) times daily. 09/22/18  Yes Angiulli, Lavon Paganini, PA-C  vitamin C (ASCORBIC ACID) 500 MG tablet Take 500 mg by mouth daily.   Yes [provider]  vitamin E 400 UNIT capsule Take 400 Units by mouth daily.   Yes [provider]    Review of Systems:  Constitutional:  No weight loss, night sweats, Fevers, chills, fatigue.  Head&Eyes: No headache.  No vision loss.  No eye pain or scotoma ENT:  No Difficulty swallowing,Tooth/dental problems,Sore throat,  No ear ache, post nasal drip,  Cardio-vascular:  No chest pain,dizziness, palpitations  GI:  No  abdominal pain, nausea, vomiting, diarrhea, loss of appetite, hematochezia, melena, heartburn, indigestion, Resp:  No shortness of breath with exertion or at rest. No cough. No coughing up of blood .No wheezing.No chest wall deformity  Skin:  no rash or lesions.  GU:  no dysuria, change in color of urine, no urgency or frequency. No flank pain.  Musculoskeletal:  No joint pain or swelling. No decreased range of motion. No back pain.  Psych:  No change in mood or affect.  Neurologic: No headache, no dysesthesia, no focal weakness, no vision loss. No syncope  Physical Exam: Vitals:   11/09/18 1205 11/09/18 1230 11/09/18 1300 11/09/18 1330  BP:  (!) 131/57 (!) 134/57 (!) 134/53  Pulse: 70 65 64 64  Resp: 14 13 13 14   Temp:      TempSrc:      SpO2: 100% 97% 96% 97%  Weight:      Height:       General:  A&O x 3, NAD, nontoxic, pleasant/cooperative Head/Eye: No conjunctival hemorrhage, no icterus, [place]/AT, No nystagmus ENT:  No icterus,  No thrush, good dentition, no pharyngeal exudate Neck:  No masses, no lymphadenpathy, no bruits CV:  RRR, no rub, no gallop, no S3 Lung:  Bibasilar crackles. No wheeze Abdomen: soft/NT, +BS, nondistended, no peritoneal signs Ext: No cyanosis, No rashes, No petechiae, No lymphangitis, 3+LE; +scrotal edma edema  Neuro: CNII-XII intact, strength 4/5 in bilateral upper and lower extremities, no dysmetria  Labs on Admission:  Basic Metabolic Panel: Recent Labs  Lab 11/09/18 1209  NA 130*  K 3.2*  CL 90*  CO2 27  GLUCOSE 77  BUN 28*  CREATININE 1.29*  CALCIUM 8.7*   Liver Function Tests: Recent Labs  Lab 11/09/18 1209  AST 32  ALT 21  ALKPHOS 39  BILITOT 1.0  PROT 6.7  ALBUMIN 3.2*   No results for input(s): LIPASE, AMYLASE in the last 168 hours. No results for input(s): AMMONIA in the last 168 hours.  CBC: Recent Labs  Lab 11/09/18 1209  WBC 10.4  NEUTROABS 6.4  HGB 10.3*  HCT 31.1*  MCV 93.1  PLT 226   Coagulation Profile: No results for input(s): INR, PROTIME in the last 168 hours. Cardiac Enzymes: No results for input(s): CKTOTAL, CKMB, CKMBINDEX, TROPONINI in the last 168 hours. BNP: Invalid input(s): POCBNP CBG: No results for input(s): GLUCAP in the last 168 hours. Urine analysis:    Component Value Date/Time   COLORURINE YELLOW 09/28/2018 1502   APPEARANCEUR TURBID (A) 09/28/2018 1502   LABSPEC 1.009 09/28/2018 1502   PHURINE 5.0 09/28/2018 1502   GLUCOSEU NEGATIVE 09/28/2018 1502   HGBUR NEGATIVE 09/28/2018 1502   BILIRUBINUR NEGATIVE 09/28/2018 1502   KETONESUR NEGATIVE 09/28/2018 1502   PROTEINUR 30 (A) 09/28/2018 1502   NITRITE POSITIVE (A) 09/28/2018 1502   LEUKOCYTESUR LARGE (A) 09/28/2018 1502   Sepsis Labs: @LABRCNTIP (procalcitonin:4,lacticidven:4) )No results found for this or any previous visit (from the past 240 hour(s)).   Radiological Exams on Admission: Dg Chest Port 1 View  Result Date: 11/09/2018 CLINICAL DATA:  Shortness of breath EXAM: PORTABLE CHEST 1 VIEW COMPARISON:  10/15/2017 FINDINGS: Cardiac silhouette is largely obscured. Lung volumes are low. There are patchy bibasilar airspace opacities. Probable small bilateral pleural effusions. No pneumothorax. Degenerative changes of the bilateral shoulders and spine. IMPRESSION: Low  lung volumes with patchy bibasilar airspace opacities and small bilateral pleural effusions. Findings may reflect atelectasis and/or pneumonia. Electronically Signed   By: Davina Poke M.D.   On: 11/09/2018 12:12    EKG: Independently reviewed. Afib, RBBB    Time spent:60 minutes Code Status:   DNR Family Communication:  Son updated at bedside10/26 Disposition Plan: expect 2-3 day hospitalization Consults called: none DVT Prophylaxis: apixban  Orson Eva, DO  [facility] Pager [phone]  If 7PM-7AM, please contact night-coverage [email or web address] Password [facility] [place] 11/09/2018, 2:43 PM

## 2018-11-09 NOTE — ED Notes (Signed)
ED TO INPATIENT HANDOFF REPORT  ED Nurse Name and Phone #: Dandy Lazaro [phone]  S Name/Age/Gender [name] 85 y.o. male Room/Bed: APA17/APA17  Code Status   Code Status: Prior  Home/SNF/Other Home Patient oriented to: self, place, time and situation Is this baseline? Yes   Triage Complete: Triage complete  Chief Complaint FLUID RETENTION-SENT BY FUSCO  Triage Note Family reports that pt has had swelling in legs. abd, and groin for several weeks.Sent by Dr Gerarda Fraction. Buddy Duty on lasix 2 weeks ago Son reports several spots to left foot   Allergies Allergies  Allergen Reactions  . Iodinated Diagnostic Agents     Other reaction(s): Fever  . Dye Fdc Red [Red Dye] Hives  . Iodine-131 Other (See Comments)    Breakout, fever  . Iohexol      Code: RASH, Desc: PT STATES HOT ALL OVER HAD TO HAVE ICE PACKS ALL OVER HIM AND DIFF BREATHING, PT REFUSED IV DYE     Level of Care/Admitting Diagnosis ED Disposition    ED Disposition [facility]: [facility] [[id number]]  Level of Care: Telemetry [5]  Covid Evaluation: N/A  Diagnosis: Acute on chronic diastolic CHF (congestive heart failure) [facility]) [[id number]]  Admitting Physician: TAT, DAVID Juvencus.Bun  Attending Physician: TAT, DAVID Juvencus.Bun  Estimated length of stay: past midnight tomorrow  Certification:: I certify this patient will need inpatient services for at least 2 midnights  PT Class (Do Not Modify): Inpatient [101]  PT Acc Code (Do Not Modify): Private [1]       B Medical/Surgery History Past Medical History:  Diagnosis Date  . Arthritis   . Borderline diabetic   . Glaucoma   . Hyperlipidemia   . Hypertension   . PVD (peripheral vascular disease) ([place])   . Sleep apnea    Cpap ordered but doesnt use   Past Surgical History:  Procedure Laterality Date  . ABDOMINAL AORTOGRAM W/LOWER EXTREMITY N/A 07/27/2018   Procedure: ABDOMINAL AORTOGRAM W/LOWER EXTREMITY;  Surgeon: Lorretta Harp, MD;  Location: [place] [facility];  Service: Cardiovascular;  Laterality: N/A;  . AMPUTATION Right 08/28/2018   Procedure: RIGHT BELOW KNEE AMPUTATION;  Surgeon: [name], MD;  Location: [place];  Service: Orthopedics;  Laterality: Right;  . APPENDECTOMY  1943  . CATARACT EXTRACTION  2012   x2  . COLONOSCOPY N/A 11/01/2014   Procedure: COLONOSCOPY;  Surgeon: Aviva Signs, MD;  [facility];  Service: Gastroenterology;  Laterality: N/A;  . ESOPHAGOGASTRODUODENOSCOPY N/A 11/01/2014   Procedure: ESOPHAGOGASTRODUODENOSCOPY (EGD);  Surgeon: Aviva Signs, MD;  [facility];  Service: Gastroenterology;  Laterality: N/A;  . HERNIA REPAIR Left   . INGUINAL HERNIA REPAIR Right 03/01/2013   Procedure: RIGHT INGUINAL HERNIORRHAPHY;  Surgeon: Jamesetta So, MD;  Location: [facility];  Service: General;  Laterality: Right;  . INSERTION OF MESH Right 03/01/2013   Procedure: INSERTION OF MESH;  Surgeon: Jamesetta So, MD;  Location: [facility];  Service: General;  Laterality: Right;  . LOWER EXTREMITY ANGIOGRAPHY Right 08/25/2018   Procedure: LOWER EXTREMITY ANGIOGRAPHY;  Surgeon: Wellington Hampshire, MD;  Location: [place] [facility];  Service: Cardiovascular;  Laterality: Right;  . NM MYOCAR PERF WALL MOTION  06/05/2010   no significant ischemia  . PERIPHERAL VASCULAR ATHERECTOMY Right 08/03/2018   Procedure: PERIPHERAL VASCULAR ATHERECTOMY;  Surgeon: Lorretta Harp, MD;  Location: [place] [facility];  Service: Cardiovascular;  Laterality: Right;  . stent  06/14/2010   left leg  . US ECHOCARDIOGRAPHY  01/21/2006   moderate mitral annular ca+, mild MR, AOV moderately sclerotic.     A IV Location/Drains/Wounds Patient Lines/Drains/Airways Status   Active Line/Drains/Airways    Name:   Placement date:   Placement time:   Site:   Days:   Peripheral IV 11/09/18 Left Antecubital   11/09/18    1201    Antecubital   less than 1   External Urinary Catheter   11/09/18    1215    -   less  than 1   Incision (Closed) 08/28/18 Leg Right;Lower;Other (Comment)   08/28/18    0753     73   Pressure Injury 09/10/18 Heel Left Stage II -  Partial thickness loss of dermis presenting as a shallow open ulcer with a red, pink wound bed without slough. was charted on 09/10/18 as a stage 1 under other wound column, now is a stage 2/open blister   09/10/18    1718     60          Intake/Output Last 24 hours  Intake/Output Summary (Last 24 hours) at 11/09/2018 1557 Last data filed at 11/09/2018 1330 Gross per 24 hour  Intake -  Output 800 ml  Net -800 ml    Labs/Imaging Results for orders placed or performed during the hospital encounter of 11/09/18 (from the past 48 hour(s))  CBC with Differential     Status: Abnormal   Collection Time: 11/09/18 12:09 PM  Result Value Ref Range   WBC 10.4 4.0 - 10.5 K/uL   RBC 3.34 (L) 4.22 - 5.81 MIL/uL   Hemoglobin 10.3 (L) 13.0 - 17.0 g/dL   HCT 31.1 (L) 39.0 - 52.0 %   MCV 93.1 80.0 - 100.0 fL   MCH 30.8 26.0 - 34.0 pg   MCHC 33.1 30.0 - 36.0 g/dL   RDW 15.0 11.5 - 15.5 %   Platelets 226 150 - 400 K/uL   nRBC 0.0 0.0 - 0.2 %   Neutrophils Relative % 61 %   Neutro Abs 6.4 1.7 - 7.7 K/uL   Lymphocytes Relative 25 %   Lymphs Abs 2.6 0.7 - 4.0 K/uL   Monocytes Relative 9 %   Monocytes Absolute 1.0 0.1 - 1.0 K/uL   Eosinophils Relative 4 %   Eosinophils Absolute 0.4 0.0 - 0.5 K/uL   Basophils Relative 1 %   Basophils Absolute 0.1 0.0 - 0.1 K/uL   Immature Granulocytes 0 %   Abs Immature Granulocytes 0.03 0.00 - 0.07 K/uL    Comment: Performed at [facility], [address], , [place] [address]  Comprehensive metabolic panel     Status: Abnormal   Collection Time: 11/09/18 12:09 PM  Result Value Ref Range   Sodium 130 (L) 135 - 145 mmol/L   Potassium 3.2 (L) 3.5 - 5.1 mmol/L   Chloride 90 (L) 98 - 111 mmol/L   CO2 27 22 - 32 mmol/L   Glucose, Bld 77 70 - 99 mg/dL   BUN 28 (H) 8 - 23 mg/dL   Creatinine, Ser 1.29 (H) 0.61 -  1.24 mg/dL   Calcium 8.7 (L) 8.9 - 10.3 mg/dL   Total Protein 6.7 6.5 - 8.1 g/dL   Albumin 3.2 (L) 3.5 - 5.0 g/dL   AST 32 15 - 41 U/L   ALT 21 0 - 44 U/L   Alkaline Phosphatase 39 38 - 126 U/L   Total Bilirubin 1.0 0.3 -  1.2 mg/dL   GFR calc non Af Amer 49 (L) >60 mL/min   GFR calc Af Amer 57 (L) >60 mL/min   Anion gap 13 5 - 15    Comment: Performed at [facility], [address] [place], [place] [address]  Brain natriuretic peptide     Status: Abnormal   Collection Time: 11/09/18 12:09 PM  Result Value Ref Range   B Natriuretic Peptide 258.0 (H) 0.0 - 100.0 pg/mL    Comment: Performed at [facility], [address], [place], [place] [address]  Troponin I (High Sensitivity)     Status: None   Collection Time: 11/09/18 12:09 PM  Result Value Ref Range   Troponin I (High Sensitivity) 14 <18 ng/L    Comment: (NOTE) Elevated high sensitivity troponin I (hsTnI) values and significant  changes across serial measurements may suggest ACS but many other  chronic and acute conditions are known to elevate hsTnI results.  Refer to the "Links" section for chest pain algorithms and additional  guidance. Performed at [facility], [address], [place], [place] [address]    Dg Chest Port 1 View  Result Date: 11/09/2018 CLINICAL DATA:  Shortness of breath EXAM: PORTABLE CHEST 1 VIEW COMPARISON:  10/15/2017 FINDINGS: Cardiac silhouette is largely obscured. Lung volumes are low. There are patchy bibasilar airspace opacities. Probable small bilateral pleural effusions. No pneumothorax. Degenerative changes of the bilateral shoulders and spine. IMPRESSION: Low lung volumes with patchy bibasilar airspace opacities and small bilateral pleural effusions. Findings may reflect atelectasis and/or pneumonia. Electronically Signed   By: Davina Poke M.D.   On: 11/09/2018 12:12    Pending Labs Unresulted Labs (From admission, onward)    Start     Ordered   11/09/18 1323  SARS CORONAVIRUS 2 (TAT  6-24 HRS) Nasopharyngeal Nasopharyngeal Swab  (Symptomatic/High Risk of Exposure/Tier 1 Patients Labs with Precautions)  Once,   STAT    Question Answer Comment  Is this test for diagnosis or screening Diagnosis of ill patient   Symptomatic for COVID-19 as defined by CDC Yes   Date of Symptom Onset 11/06/2018   Hospitalized for COVID-19 Yes   Admitted to ICU for COVID-19 No   Previously tested for COVID-19 Yes   Resident in a congregate (group) care setting No   Employed in healthcare setting No      11/09/18 1323   Signed and Held  Basic metabolic panel  Daily,   R     Signed and Held   Signed and Held  Hemoglobin A1c  Once,   R    Comments: To assess prior glycemic control    Signed and Held   Signed and Held  Procalcitonin - Baseline  ONCE - STAT,   STAT     Signed and Held   Signed and Held  Protein / creatinine ratio, urine  Once,   R     Signed and Held          Vitals/Pain Today's Vitals   11/09/18 1205 11/09/18 1230 11/09/18 1300 11/09/18 1330  BP:  (!) 131/57 (!) 134/57 (!) 134/53  Pulse: 70 65 64 64  Resp: 14 13 13 14   Temp:      TempSrc:      SpO2: 100% 97% 96% 97%  Weight:      Height:      PainSc:        Isolation Precautions Airborne and Contact precautions  Medications Medications  azithromycin (ZITHROMAX) 500 mg in sodium  chloride 0.9 % 250 mL IVPB (500 mg Intravenous New Bag/Given 11/09/18 1512)  furosemide (LASIX) injection 40 mg (40 mg Intravenous Given 11/09/18 1223)  cefTRIAXone (ROCEPHIN) 1 g in sodium chloride 0.9 % 100 mL IVPB (1 g Intravenous New Bag/Given 11/09/18 1422)    Mobility manual wheelchair High fall risk   Focused Assessments    R Recommendations: See Admitting Provider Note  Report given to:   Additional Notes:

## 2018-11-09 NOTE — ED Triage Notes (Addendum)
Family reports that pt has had swelling in legs. abd, and groin for several weeks.Sent by Dr Gerarda Fraction. Buddy Duty on lasix 2 weeks ago Son reports several spots to left foot

## 2018-11-10 DIAGNOSIS — I739 Peripheral vascular disease, unspecified: Secondary | ICD-10-CM

## 2018-11-10 DIAGNOSIS — R609 Edema, unspecified: Secondary | ICD-10-CM

## 2018-11-10 DIAGNOSIS — R600 Localized edema: Secondary | ICD-10-CM

## 2018-11-10 LAB — BASIC METABOLIC PANEL
Anion gap: 12 (ref 5–15)
BUN: 28 mg/dL — ABNORMAL HIGH (ref 8–23)
CO2: 23 mmol/L (ref 22–32)
Calcium: 8.3 mg/dL — ABNORMAL LOW (ref 8.9–10.3)
Chloride: 96 mmol/L — ABNORMAL LOW (ref 98–111)
Creatinine, Ser: 1.32 mg/dL — ABNORMAL HIGH (ref 0.61–1.24)
GFR calc Af Amer: 55 mL/min — ABNORMAL LOW (ref >60)
GFR calc non Af Amer: 47 mL/min — ABNORMAL LOW (ref >60)
Glucose, Bld: 94 mg/dL (ref 70–99)
Potassium: 3 mmol/L — ABNORMAL LOW (ref 3.5–5.1)
Sodium: 131 mmol/L — ABNORMAL LOW (ref 135–145)

## 2018-11-10 LAB — HEMOGLOBIN A1C
Hgb A1c MFr Bld: 5.9 % — ABNORMAL HIGH (ref 4.8–5.6)
Mean Plasma Glucose: 122.63 mg/dL

## 2018-11-10 LAB — GLUCOSE, CAPILLARY
Glucose-Capillary: 90 mg/dL (ref 70–99)
Glucose-Capillary: 149 mg/dL — ABNORMAL HIGH (ref 70–99)
Glucose-Capillary: 139 mg/dL — ABNORMAL HIGH (ref 70–99)
Glucose-Capillary: 162 mg/dL — ABNORMAL HIGH (ref 70–99)

## 2018-11-10 MED ORDER — POTASSIUM CHLORIDE CRYS ER 20 MEQ PO TBCR
40.0000 meq | EXTENDED_RELEASE_TABLET | Freq: Four times a day (QID) | ORAL | Status: AC
  Administered 2018-11-10 (×2): 40 meq via ORAL
  Filled 2018-11-10 (×2): qty 2

## 2018-11-10 MED ORDER — APIXABAN 5 MG PO TABS
5.0000 mg | ORAL_TABLET | Freq: Two times a day (BID) | ORAL | Status: DC
  Administered 2018-11-10 – 2018-11-14 (×8): 5 mg via ORAL
  Filled 2018-11-10 (×8): qty 1

## 2018-11-10 NOTE — Progress Notes (Signed)
PROGRESS NOTE  [name] BOF:[id number] DOB: 28-May-1927 DOA: 11/09/2018 PCP: Redmond School, MD  Brief History:  85 y.o. male with medical history of diabetes mellitus type 2, diastolic CHF, CKD stage III, hypertension, right BKA, paroxysmal atrial fibrillation, peripheral arterial disease presenting with increasing lower extremity edema extending to his groin, scrotum, and lower abdomen.  The patient normally has lower extreme edema, but the son states that it has significantly worsened in the past week.  Patient has had some intermittent shortness of breath, but states that this is not any worse than usual.  He denies any fever, chills, chest pain, coughing, hemoptysis, nausea vomiting, diarrhea, abdominal pain, dysuria, hematuria.  According to the patient's son, the patient is supposed to be taking furosemide 40 mg twice daily, but he only is able to take it twice a day for 3 to 4 days out of the usual week because of his numerous doctors appointments and other commitments.  Apparently, his furosemide was recently increased to 40 mg 3 times daily, but the patient has not been taking it this frequently as discussed above.  Son states that they eat out approximately 2 times per week.  There is no flow restriction.  The patient does not weigh himself.  He went to see his primary care provider on 10/26.  Because of concerns of worsening edema and anasarca, the patient was sent to emergency department for further evaluation. In the emergency department, the patient was afebrile hemodynamically stable with oxygen saturation 96-98% on room air.  BMP showed a potassium 3.2, sodium 130, normal LFTs peer WBC was 10.4 with hemoglobin 10.3, platelets 226,000.  Chest x-ray showed increased interstitial markings, low volumes, small bilateral pleural effusions.  The patient was started IV furosemide.  Assessment/Plan: Acute on chronic diastolic CHF -Patient presents with decompensation and anasarca  -Continue furosemide 40 mg IV twice daily -Daily weights -08/27/2018 echo EF 55 to 60%, impaired relaxation, moderate aortic stenosis -Accurate I's and O's--NEG 2.6L -Urine protein creatinine ratio  Peripheral arterial disease -Patient had right BKA secondary to critical limb ischemia 08/28/2018 -I had concern regarding the patient's left foot coolness and discoloration--a little better on 10/27 -Case was discussed with Dr. [name] who has been following the patient's peripheral arterial disease--> no urgent intervention necessary at this time unless the patient's condition changes/worsens -Continue apixaban and aspirin -see pictures below  Paroxysmal atrial fibrillation -Rate controlled -Continue apixaban  CKD stage III -Baseline creatinine 1.0-1.3 -Monitor with diuresis  Diabetes mellitus type 2, controlled -Holding glipizide -11/09/18 hemoglobin A1c--5.9 -05/29/2018 hemoglobin A1c 7.0 -NovoLog sliding scale  Hypothyroidism -Continue Synthroid  Hyperlipidemia -Continue statin  Hypokalemia -Replete -Check magnesium        Disposition Plan:   Home in 2-3 days  Family Communication:   Son updated at bedside 10/27  Consultants:  none  Code Status:  DNR  DVT Prophylaxis: apixaban   Procedures: As Listed in Progress Note Above  Antibiotics: None       Subjective: Patient denies fevers, chills, headache, chest pain, dyspnea, nausea, vomiting, diarrhea, abdominal pain, dysuria, hematuria, hematochezia, and melena.   Objective: Vitals:   11/10/18 0518 11/10/18 0724 11/10/18 0822 11/10/18 1432  BP: (!) 126/54  (!) 134/58 (!) 126/58  Pulse: 61  (!) 59 62  Resp: 20  12 19   Temp: 98.1 F (36.7 C)     TempSrc: Oral     SpO2: 98% 96% 92% 97%  Weight:  Height:        Intake/Output Summary (Last 24 hours) at 11/10/2018 1807 Last data filed at 11/10/2018 1300 Gross per 24 hour  Intake 620 ml  Output 2000 ml  Net -1380 ml    Weight change:  Exam:   General:  Pt is alert, follows commands appropriately, not in acute distress  HEENT: No icterus, No thrush, No neck mass, [place]/AT  Cardiovascular: RRR, S1/S2, no rubs, no gallops  Respiratory: bibasilar crackles, no wheeze  Abdomen: Soft/+BS, non tender, non distended, no guarding  Extremities: 2+ LLE edema, No lymphangitis, No petechiae, No rashes, no synovitis LEFT FOOT         Data Reviewed: I have personally reviewed following labs and imaging studies Basic Metabolic Panel: Recent Labs  Lab 11/09/18 1209 11/10/18 0655  NA 130* 131*  K 3.2* 3.0*  CL 90* 96*  CO2 27 23  GLUCOSE 77 94  BUN 28* 28*  CREATININE 1.29* 1.32*  CALCIUM 8.7* 8.3*   Liver Function Tests: Recent Labs  Lab 11/09/18 1209  AST 32  ALT 21  ALKPHOS 39  BILITOT 1.0  PROT 6.7  ALBUMIN 3.2*   No results for input(s): LIPASE, AMYLASE in the last 168 hours. No results for input(s): AMMONIA in the last 168 hours. Coagulation Profile: No results for input(s): INR, PROTIME in the last 168 hours. CBC: Recent Labs  Lab 11/09/18 1209  WBC 10.4  NEUTROABS 6.4  HGB 10.3*  HCT 31.1*  MCV 93.1  PLT 226   Cardiac Enzymes: No results for input(s): CKTOTAL, CKMB, CKMBINDEX, TROPONINI in the last 168 hours. BNP: Invalid input(s): POCBNP CBG: Recent Labs  Lab 11/09/18 2124 11/10/18 0825 11/10/18 1128 11/10/18 1735  GLUCAP 100* 90 149* 139*   HbA1C: Recent Labs    11/09/18 1209  HGBA1C 5.9*   Urine analysis:    Component Value Date/Time   COLORURINE YELLOW 09/28/2018 1502   APPEARANCEUR TURBID (A) 09/28/2018 1502   LABSPEC 1.009 09/28/2018 1502   PHURINE 5.0 09/28/2018 1502   GLUCOSEU NEGATIVE 09/28/2018 1502   HGBUR NEGATIVE 09/28/2018 1502   BILIRUBINUR NEGATIVE 09/28/2018 1502   KETONESUR NEGATIVE 09/28/2018 1502   PROTEINUR 30 (A) 09/28/2018 1502   NITRITE POSITIVE (A) 09/28/2018 1502   LEUKOCYTESUR LARGE (A) 09/28/2018 1502   Sepsis Labs:  @LABRCNTIP (procalcitonin:4,lacticidven:4) ) Recent Results (from the past 240 hour(s))  SARS CORONAVIRUS 2 (Roxie Gueye 6-24 HRS) Nasopharyngeal Nasopharyngeal Swab     Status: None   Collection Time: 11/09/18  1:23 PM   Specimen: Nasopharyngeal Swab  Result Value Ref Range Status   SARS Coronavirus 2 NEGATIVE NEGATIVE Final    Comment: (NOTE) SARS-CoV-2 target nucleic acids are NOT DETECTED. The SARS-CoV-2 RNA is generally detectable in upper and lower respiratory specimens during the acute phase of infection. Negative results do not preclude SARS-CoV-2 infection, do not rule out co-infections with other pathogens, and should not be used as the sole basis for treatment or other patient management decisions. Negative results must be combined with clinical observations, patient history, and epidemiological information. The expected result is Negative. Fact Sheet for Patients: SugarRoll.be Fact Sheet for Healthcare Providers: https://www.woods-mathews.com/ This test is not yet approved or cleared by the [place] FDA and  has been authorized for detection and/or diagnosis of SARS-CoV-2 by FDA under an Emergency Use Authorization (EUA). This EUA will remain  in effect (meaning this test can be used) for the duration of the COVID-19 declaration under Section 56 4(b)(1) of the Act, 21 U.S.C.  section 360bbb-3(b)(1), unless the authorization is terminated or revoked sooner. Performed at [facility], [place] [address], [place], [place] [address]      Scheduled Meds: . apixaban  2.5 mg Oral BID  . aspirin EC  81 mg Oral Daily  . atorvastatin  40 mg Oral Daily  . dorzolamide-timolol  1 drop Both Eyes BID  . doxazosin  2 mg Oral QHS  . famotidine  20 mg Oral Daily  . finasteride  5 mg Oral QPM  . furosemide  40 mg Intravenous BID  . insulin aspart  0-9 Units Subcutaneous TID WC  . levothyroxine  50 mcg Oral QAC breakfast  . sodium chloride  flush  3 mL Intravenous Q12H  . tamsulosin  0.4 mg Oral BID  . vitamin C  500 mg Oral Daily  . vitamin E  400 Units Oral Daily   Continuous Infusions: . sodium chloride    . azithromycin 500 mg (11/10/18 1234)    Procedures/Studies: US Arterial Abi (screening Lower Extremity)  Result Date: 10/15/2018 CLINICAL DATA:  85 year old male with a history of leg cellulitis EXAM: NONINVASIVE PHYSIOLOGIC VASCULAR STUDY OF BILATERAL LOWER EXTREMITIES TECHNIQUE: Evaluation of both lower extremities was performed at rest, including calculation of ankle-brachial indices, multiple segmental pressure evaluation, segmental Doppler and segmental pulse volume recording. COMPARISON:  None. FINDINGS: Right ABI:  Right below-the-knee amputation Left ABI:  0.51 Right Lower Extremity:  Right below-knee amputation Left Lower Extremity: Segmental Doppler at the left ankle demonstrates monophasic waveforms. IMPRESSION: Resting ABI on the left in the moderate range arterial occlusive disease with segmental exam demonstrating significant arterial occlusive disease above the ankle. Signed, Dulcy Fanny. Dellia Nims, RPVI [facility] Electronically Signed   By: Corrie Mckusick D.O.   On: 10/15/2018 07:52   Dg Chest Port 1 View  Result Date: 11/09/2018 CLINICAL DATA:  Shortness of breath EXAM: PORTABLE CHEST 1 VIEW COMPARISON:  10/15/2017 FINDINGS: Cardiac silhouette is largely obscured. Lung volumes are low. There are patchy bibasilar airspace opacities. Probable small bilateral pleural effusions. No pneumothorax. Degenerative changes of the bilateral shoulders and spine. IMPRESSION: Low lung volumes with patchy bibasilar airspace opacities and small bilateral pleural effusions. Findings may reflect atelectasis and/or pneumonia. Electronically Signed   By: Davina Poke M.D.   On: 11/09/2018 12:12    Orson Eva, DO  [facility] Pager [phone]  If 7PM-7AM,  please contact night-coverage [email or web address] Password [email or web address] 11/10/2018, 6:07 PM   LOS: 1 day

## 2018-11-10 NOTE — TOC Initial Note (Signed)
Transition of Care [facility]) - Initial/Assessment Note    Patient Details  Name: [name] MRN: [id number] Date of Birth: 30-Dec-1927  Transition of Care [facility]) CM/SW Contact:    Boneta Lucks, RN Phone Number: 11/10/2018, 1:15 PM  Clinical Narrative:       Patient admitted with CHF and has high risk readmission score.  SonEdd Johnston at the bedside.  States his father had right BKA in August. Since that time he comes here from [place] to stay with his father and his siblings takes care of him on the weekends.  They have obtain all medical equipment from family to include hospital bed, lift, WC, 3N1,  and wheelchair  accessible van. He is schedule to get prosthetic next week. They have no problems with medication or transportation.  No needs identified.  TOC to follow.            Expected Discharge Plan: Home/Self Care Barriers to Discharge: Continued Medical Work up   Patient Goals and CMS Choice Patient states their goals for this hospitalization and ongoing recovery are:: to go home with family.      Expected Discharge Plan and Services Expected Discharge Plan: Home/Self Care       Living arrangements for the past 2 months: Single Family Home    Prior Living Arrangements/Services Living arrangements for the past 2 months: [place] with:: Adult Children Patient language and need for interpreter reviewed:: Yes Do you feel safe going back to the place where you live?: Yes      Need for Family Participation in Patient Care: Yes (Comment) Care giver support system in place?: Yes (comment) Current home services: DME Criminal Activity/Legal Involvement Pertinent to Current Situation/Hospitalization: No - Comment as needed  Activities of Daily Living Home Assistive Devices/Equipment: Dentures (specify type), Eyeglasses, Walker (specify type), Bedside commode/3-in-1, Hospital bed, Hoyer Lift ADL Screening (condition at time of admission) Patient's cognitive  ability adequate to safely complete daily activities?: Yes Is the patient deaf or have difficulty hearing?: No Does the patient have difficulty seeing, even when wearing glasses/contacts?: No Does the patient have difficulty concentrating, remembering, or making decisions?: No Patient able to express need for assistance with ADLs?: Yes Does the patient have difficulty dressing or bathing?: No Independently performs ADLs?: No Communication: Needs assistance Is this a change from baseline?: Pre-admission baseline Dressing (OT): Needs assistance Is this a change from baseline?: Pre-admission baseline Grooming: Needs assistance Is this a change from baseline?: Pre-admission baseline Feeding: Independent Bathing: Needs assistance Is this a change from baseline?: Pre-admission baseline Toileting: Needs assistance Is this a change from baseline?: Pre-admission baseline In/Out Bed: Needs assistance Is this a change from baseline?: Pre-admission baseline Walks in Home: Needs assistance Is this a change from baseline?: Pre-admission baseline Does the patient have difficulty walking or climbing stairs?: Yes Weakness of Legs: Both Weakness of Arms/Hands: None  Permission Sought/Granted      Share Information with NAME: [name]     Permission granted to share info w Relationship: son     Emotional Assessment       Orientation: : Oriented to Self, Oriented to Place, Oriented to Situation Alcohol / Substance Use: Not Applicable Psych Involvement: No (comment)  Admission diagnosis:  Peripheral edema [R60.9] Community acquired pneumonia, unspecified laterality [J18.9] Patient Active Problem List   Diagnosis Date Noted  . Acute on chronic diastolic CHF (congestive heart failure) ([place]) 11/09/2018  . S/P BKA (below knee amputation) unilateral, right ([place]) 10/05/2018  . Hyponatremia   .  Essential hypertension   . Postoperative pain   . Pain of left heel   . Unable to maintain  weight-bearing   . Type 2 diabetes mellitus with diabetic peripheral angiopathy with gangrene ([place])   . Anemia due to acute blood loss   . Stage 3 chronic kidney disease   . Acquired absence of right leg below knee ([place]) 09/07/2018  . Gangrene of right foot ([place])   . DNR (do not resuscitate) discussion   . Goals of care, counseling/discussion   . Palliative care by specialist   . Type 2 diabetes mellitus with foot ulcer and gangrene ([place])   . Severe protein-calorie malnutrition ([place])   . Ischemic foot 08/20/2018  . Critical lower limb ischemia 08/03/2018  . Type 2 diabetes mellitus with foot ulcer, without long-term current use of insulin ([place])   . Gastroesophageal reflux disease   . Cellulitis of great toe of right foot 05/29/2018  . Atrial fibrillation, chronic   . Chest pain 07/25/2017  . PAF (paroxysmal atrial fibrillation) ([place]) 07/25/2017  . BPH (benign prostatic hyperplasia) 07/25/2017  . Shortness of breath 05/11/2017  . Hypothyroidism 05/11/2017  . Chronic diastolic CHF (congestive heart failure) ([place]) 05/11/2017  . Aortic valve stenosis 06/12/2016  . RBBB 06/12/2016  . PAD (peripheral artery disease) ([place]) 08/04/2012  . Essential hypertension, benign 08/04/2012  . Hyperlipidemia 08/04/2012  . Dizziness 08/04/2012   PCP:  Redmond School, MD Pharmacy:   [facility] [place], [place] [facility] [place] [address] [place] [address] Phone: [phone] Fax: [phone]  [facility] [place], [place] [address] [place] [address] Phone: [phone] Fax: [phone]   Readmission Risk Interventions Readmission Risk Prevention Plan 11/10/2018 08/28/2018  Transportation Screening Complete Complete  PCP or Specialist Appt within 3-5 Days - Not Complete  Not Complete comments - plan for SNF  HRI or [place] - Not Complete  HRI or Home Care  Consult comments - plan for SNF  Social Work Consult for [place] Planning/Counseling - Complete  Palliative Care Screening - Complete  Medication Review (RN Care Manager) Complete Complete  PCP or Specialist appointment within 3-5 days of discharge Not Complete -  [place] or Home Care Consult Complete -  SW Recovery Care/Counseling Consult Complete -  Palliative Care Screening Not Complete -  [place] Not Complete -  Some recent data might be hidden

## 2018-11-11 ENCOUNTER — Encounter: Payer: Self-pay | Admitting: Orthopedic Surgery

## 2018-11-11 LAB — GLUCOSE, CAPILLARY
Glucose-Capillary: 127 mg/dL — ABNORMAL HIGH (ref 70–99)
Glucose-Capillary: 188 mg/dL — ABNORMAL HIGH (ref 70–99)
Glucose-Capillary: 169 mg/dL — ABNORMAL HIGH (ref 70–99)

## 2018-11-11 LAB — BASIC METABOLIC PANEL
Anion gap: 10 (ref 5–15)
BUN: 32 mg/dL — ABNORMAL HIGH (ref 8–23)
CO2: 27 mmol/L (ref 22–32)
Calcium: 8.5 mg/dL — ABNORMAL LOW (ref 8.9–10.3)
Chloride: 95 mmol/L — ABNORMAL LOW (ref 98–111)
Creatinine, Ser: 1.36 mg/dL — ABNORMAL HIGH (ref 0.61–1.24)
GFR calc Af Amer: 53 mL/min — ABNORMAL LOW (ref >60)
GFR calc non Af Amer: 46 mL/min — ABNORMAL LOW (ref >60)
Glucose, Bld: 138 mg/dL — ABNORMAL HIGH (ref 70–99)
Potassium: 3.5 mmol/L (ref 3.5–5.1)
Sodium: 132 mmol/L — ABNORMAL LOW (ref 135–145)

## 2018-11-11 LAB — MAGNESIUM: Magnesium: 2 mg/dL (ref 1.7–2.4)

## 2018-11-11 MED ORDER — FUROSEMIDE 10 MG/ML IJ SOLN
40.0000 mg | Freq: Every day | INTRAMUSCULAR | Status: DC
  Administered 2018-11-12: 40 mg via INTRAVENOUS
  Filled 2018-11-11: qty 4

## 2018-11-11 MED ORDER — POTASSIUM CHLORIDE CRYS ER 20 MEQ PO TBCR
40.0000 meq | EXTENDED_RELEASE_TABLET | Freq: Two times a day (BID) | ORAL | Status: AC
  Administered 2018-11-11 (×2): 40 meq via ORAL
  Filled 2018-11-11 (×2): qty 2

## 2018-11-11 NOTE — Plan of Care (Signed)
  Problem: Education: Goal: Knowledge of General Education information will improve Description Including pain rating scale, medication(s)/side effects and non-pharmacologic comfort measures Outcome: Progressing   

## 2018-11-11 NOTE — Progress Notes (Signed)
PROGRESS NOTE  [name] XKG:[id number] DOB: 1927-02-11 DOA: 11/09/2018 PCP: Redmond School, MD  Brief History:  85 y.o. male with medical history of diabetes mellitus type 2, diastolic CHF, CKD stage III, hypertension, right BKA, paroxysmal atrial fibrillation, peripheral arterial disease presenting with increasing lower extremity edema extending to his groin, scrotum, and lower abdomen.  The patient normally has lower extreme edema, but the son states that it has significantly worsened in the past week.  Patient has had some intermittent shortness of breath, but states that this is not any worse than usual.  He denies any fever, chills, chest pain, coughing, hemoptysis, nausea vomiting, diarrhea, abdominal pain, dysuria, hematuria.  According to the patient's son, the patient is supposed to be taking furosemide 40 mg twice daily, but he only is able to take it twice a day for 3 to 4 days out of the usual week because of his numerous doctors appointments and other commitments.  Apparently, his furosemide was recently increased to 40 mg 3 times daily, but the patient has not been taking it this frequently as discussed above.  Son states that they eat out approximately 2 times per week.  There is no flow restriction.  The patient does not weigh himself.  He went to see his primary care provider on 10/26.  Because of concerns of worsening edema and anasarca, the patient was sent to emergency department for further evaluation. In the emergency department, the patient was afebrile hemodynamically stable with oxygen saturation 96-98% on room air.  BMP showed a potassium 3.2, sodium 130, normal LFTs peer WBC was 10.4 with hemoglobin 10.3, platelets 226,000.  Chest x-ray showed increased interstitial markings, low volumes, small bilateral pleural effusions.  The patient was started IV furosemide.  Assessment/Plan: Acute on chronic diastolic CHF -Patient presents with decompensation and anasarca  -Continue furosemide 40 mg IV daily -Daily weights -08/27/2018 echo EF 55 to 60%, impaired relaxation, moderate aortic stenosis -Accurate I's and O's--NEG 5.5L -Urine protein creatinine ratio  Peripheral arterial disease -Patient had right BKA secondary to critical limb ischemia 08/28/2018 -Case was discussed with Dr. [name] who has been following the patient's peripheral arterial disease--> no urgent intervention necessary at this time unless the patient's condition changes/worsens -Continue apixaban and aspirin, follow up with Dr. Gwenlyn Found outpatient.   Paroxysmal atrial fibrillation -Rate controlled -Continue apixaban  CKD stage III -Baseline creatinine 1.0-1.3 -Monitor with diuresis  Diabetes mellitus type 2, controlled -Holding glipizide -11/09/18 hemoglobin A1c--5.9 -05/29/2018 hemoglobin A1c 7.0 -NovoLog sliding scale  Hypothyroidism -Continue Synthroid  Hyperlipidemia -Continue statin  Hypokalemia -Replete -Check magnesium   Disposition Plan:   Home tomorrow if stable  Family Communication:   Son updated at bedside 10/27  Consultants:  none  Code Status:  DNR  DVT Prophylaxis: apixaban   Procedures: As Listed in Progress Note Above  Antibiotics: None  Subjective: Patient says that he has been urinating frequently. No specific complaints.   Objective: Vitals:   11/10/18 1900 11/10/18 2024 11/11/18 0500 11/11/18 1305  BP:  (!) 133/53  140/68  Pulse:  63  68  Resp:  18  19  Temp:  98.2 F (36.8 C)  99.1 F (37.3 C)  TempSrc:    Oral  SpO2: 97% 95%  100%  Weight:   110.1 kg   Height:        Intake/Output Summary (Last 24 hours) at 11/11/2018 1307 Last data filed at 11/11/2018 1040 Gross per  24 hour  Intake 240 ml  Output 2580 ml  Net -2340 ml   Weight change: 11.1 kg Exam:   General:  Pt is alert, follows commands appropriately, not in acute distress  HEENT: No icterus, No thrush, No neck mass, [place]/AT  Cardiovascular:  RRR, S1/S2, no rubs, no gallops  Respiratory: bibasilar crackles, no wheeze  Abdomen: Soft/+BS, non tender, non distended, no guarding  Extremities: 1+ LLE edema, No lymphangitis, No petechiae, No rashes, no synovitis, left foot red, nontender, warm to touch, left 1st toe unchanged.   Data Reviewed: I have personally reviewed following labs and imaging studies Basic Metabolic Panel: Recent Labs  Lab 11/09/18 1209 11/10/18 0655 11/11/18 0624  NA 130* 131* 132*  K 3.2* 3.0* 3.5  CL 90* 96* 95*  CO2 27 23 27   GLUCOSE 77 94 138*  BUN 28* 28* 32*  CREATININE 1.29* 1.32* 1.36*  CALCIUM 8.7* 8.3* 8.5*  MG  --   --  2.0   Liver Function Tests: Recent Labs  Lab 11/09/18 1209  AST 32  ALT 21  ALKPHOS 39  BILITOT 1.0  PROT 6.7  ALBUMIN 3.2*   No results for input(s): LIPASE, AMYLASE in the last 168 hours. No results for input(s): AMMONIA in the last 168 hours. Coagulation Profile: No results for input(s): INR, PROTIME in the last 168 hours. CBC: Recent Labs  Lab 11/09/18 1209  WBC 10.4  NEUTROABS 6.4  HGB 10.3*  HCT 31.1*  MCV 93.1  PLT 226   Cardiac Enzymes: No results for input(s): CKTOTAL, CKMB, CKMBINDEX, TROPONINI in the last 168 hours. BNP: Invalid input(s): POCBNP CBG: Recent Labs  Lab 11/10/18 1128 11/10/18 1735 11/10/18 2020 11/11/18 0718 11/11/18 1057  GLUCAP 149* 139* 162* 127* 188*   HbA1C: Recent Labs    11/09/18 1209  HGBA1C 5.9*   Urine analysis:  Recent Results (from the past 240 hour(s))  SARS CORONAVIRUS 2 (TAT 6-24 HRS) Nasopharyngeal Nasopharyngeal Swab     Status: None   Collection Time: 11/09/18  1:23 PM   Specimen: Nasopharyngeal Swab  Result Value Ref Range Status   SARS Coronavirus 2 NEGATIVE NEGATIVE Final    Comment: (NOTE) SARS-CoV-2 target nucleic acids are NOT DETECTED. The SARS-CoV-2 RNA is generally detectable in upper and lower respiratory specimens during the acute phase of infection. Negative results do not  preclude SARS-CoV-2 infection, do not rule out co-infections with other pathogens, and should not be used as the sole basis for treatment or other patient management decisions. Negative results must be combined with clinical observations, patient history, and epidemiological information. The expected result is Negative. Fact Sheet for Patients: SugarRoll.be Fact Sheet for Healthcare Providers: https://www.woods-mathews.com/ This test is not yet approved or cleared by the [place] FDA and  has been authorized for detection and/or diagnosis of SARS-CoV-2 by FDA under an Emergency Use Authorization (EUA). This EUA will remain  in effect (meaning this test can be used) for the duration of the COVID-19 declaration under Section 56 4(b)(1) of the Act, 21 U.S.C. section 360bbb-3(b)(1), unless the authorization is terminated or revoked sooner. Performed at [facility], [place] [address] [place], [place] [address]      Scheduled Meds: . apixaban  5 mg Oral BID  . aspirin EC  81 mg Oral Daily  . atorvastatin  40 mg Oral Daily  . dorzolamide-timolol  1 drop Both Eyes BID  . doxazosin  2 mg Oral QHS  . famotidine  20 mg Oral Daily  .  finasteride  5 mg Oral QPM  . [START ON 11/12/2018] furosemide  40 mg Intravenous Daily  . insulin aspart  0-9 Units Subcutaneous TID WC  . levothyroxine  50 mcg Oral QAC breakfast  . potassium chloride  40 mEq Oral BID  . sodium chloride flush  3 mL Intravenous Q12H  . tamsulosin  0.4 mg Oral BID  . vitamin C  500 mg Oral Daily  . vitamin E  400 Units Oral Daily   Continuous Infusions: . sodium chloride      Procedures/Studies: US Arterial Abi (screening Lower Extremity)  Result Date: 10/15/2018 CLINICAL DATA:  85 year old male with a history of leg cellulitis EXAM: NONINVASIVE PHYSIOLOGIC VASCULAR STUDY OF BILATERAL LOWER EXTREMITIES TECHNIQUE: Evaluation of both lower extremities was performed at  rest, including calculation of ankle-brachial indices, multiple segmental pressure evaluation, segmental Doppler and segmental pulse volume recording. COMPARISON:  None. FINDINGS: Right ABI:  Right below-the-knee amputation Left ABI:  0.51 Right Lower Extremity:  Right below-knee amputation Left Lower Extremity: Segmental Doppler at the left ankle demonstrates monophasic waveforms. IMPRESSION: Resting ABI on the left in the moderate range arterial occlusive disease with segmental exam demonstrating significant arterial occlusive disease above the ankle. Signed, Dulcy Fanny. Dellia Nims, RPVI [facility]iology Electronically Signed   By: Corrie Mckusick D.O.   On: 10/15/2018 07:52   Dg Chest Port 1 View  Result Date: 11/09/2018 CLINICAL DATA:  Shortness of breath EXAM: PORTABLE CHEST 1 VIEW COMPARISON:  10/15/2017 FINDINGS: Cardiac silhouette is largely obscured. Lung volumes are low. There are patchy bibasilar airspace opacities. Probable small bilateral pleural effusions. No pneumothorax. Degenerative changes of the bilateral shoulders and spine. IMPRESSION: Low lung volumes with patchy bibasilar airspace opacities and small bilateral pleural effusions. Findings may reflect atelectasis and/or pneumonia. Electronically Signed   By: Davina Poke M.D.   On: 11/09/2018 12:12    Irwin Brakeman, MD [facility] How to contact the [facility] Attending or Consulting provider [place] or covering provider during after hours [place], for this patient?  1. Check the care team in [facility] and look for a) attending/consulting TR[facility] provider listed and b) the [facility] team listed 2. Log into [email or web address] and use [place]'s universal password to access. If you do not have the password, please contact the hospital operator. 3. Locate the [facility] provider you are looking for under [facility] and page to a number that you can be directly reached. 4. If you still have  difficulty reaching the provider, please page the [facility] (Director on Call) for the Hospitalists listed on amion for assistance.   If 7PM-7AM, please contact night-coverage [email or web address] Password [email or web address] 11/11/2018, 1:07 PM   LOS: 2 days

## 2018-11-12 ENCOUNTER — Ambulatory Visit: Payer: Medicare Other | Admitting: Orthopedic Surgery

## 2018-11-12 LAB — BASIC METABOLIC PANEL
Anion gap: 11 (ref 5–15)
BUN: 33 mg/dL — ABNORMAL HIGH (ref 8–23)
CO2: 26 mmol/L (ref 22–32)
Calcium: 8.5 mg/dL — ABNORMAL LOW (ref 8.9–10.3)
Chloride: 95 mmol/L — ABNORMAL LOW (ref 98–111)
Creatinine, Ser: 1.38 mg/dL — ABNORMAL HIGH (ref 0.61–1.24)
GFR calc Af Amer: 52 mL/min — ABNORMAL LOW (ref >60)
GFR calc non Af Amer: 45 mL/min — ABNORMAL LOW (ref >60)
Glucose, Bld: 131 mg/dL — ABNORMAL HIGH (ref 70–99)
Potassium: 4.2 mmol/L (ref 3.5–5.1)
Sodium: 132 mmol/L — ABNORMAL LOW (ref 135–145)

## 2018-11-12 LAB — GLUCOSE, CAPILLARY
Glucose-Capillary: 131 mg/dL — ABNORMAL HIGH (ref 70–99)
Glucose-Capillary: 143 mg/dL — ABNORMAL HIGH (ref 70–99)
Glucose-Capillary: 152 mg/dL — ABNORMAL HIGH (ref 70–99)
Glucose-Capillary: 164 mg/dL — ABNORMAL HIGH (ref 70–99)

## 2018-11-12 MED ORDER — POTASSIUM CHLORIDE CRYS ER 10 MEQ PO TBCR: 10.0000 meq | EXTENDED_RELEASE_TABLET | Freq: Every day | ORAL | 1 refills | Status: DC

## 2018-11-12 MED ORDER — FUROSEMIDE 10 MG/ML IJ SOLN
40.0000 mg | Freq: Once | INTRAMUSCULAR | Status: AC
  Administered 2018-11-12: 40 mg via INTRAVENOUS
  Filled 2018-11-12: qty 4

## 2018-11-12 MED ORDER — FAMOTIDINE 20 MG PO TABS: 20.0000 mg | ORAL_TABLET | Freq: Every day | ORAL | 0 refills | Status: DC

## 2018-11-12 MED ORDER — POTASSIUM CHLORIDE CRYS ER 20 MEQ PO TBCR
40.0000 meq | EXTENDED_RELEASE_TABLET | Freq: Two times a day (BID) | ORAL | Status: AC
  Administered 2018-11-12 – 2018-11-13 (×2): 40 meq via ORAL
  Filled 2018-11-12 (×2): qty 2

## 2018-11-12 MED ORDER — FUROSEMIDE 40 MG PO TABS: 40.0000 mg | ORAL_TABLET | Freq: Every day | ORAL | 1 refills | Status: DC

## 2018-11-12 NOTE — Discharge Summary (Addendum)
Physician Discharge Summary  [name] QQV:[id number] DOB: [birth year] DOA: 11/09/2018  PCP: Redmond School, MD Cardiologist: Dr. Gwenlyn Found  Admit date: 11/09/2018 Discharge date: 11/16/2018  Admitted From:  Home  Disposition: Home   Recommendations for Outpatient Follow-up:  1. Follow up with PCP on 11/3 as scheduled 2. Follow up with Cardiology in 1 week 3. Please obtain BMP in 1 week to follow lytes and renal function  Home Health: PT, RN, SLP  Discharge Condition: STABLE   CODE STATUS: DNR    Brief Hospitalization Summary: Please see all hospital notes, images, labs for full details of the hospitalization. Dr. Doristine Devoid HPI:  [name] is a 85 y.o. male with medical history of diabetes mellitus type 2, diastolic CHF, CKD stage III, hypertension, right BKA, paroxysmal atrial fibrillation, peripheral arterial disease presenting with increasing lower extremity edema extending to his groin, scrotum, and lower abdomen.  The patient normally has lower extreme edema, but the son states that it has significantly worsened in the past week.  Patient has had some intermittent shortness of breath, but states that this is not any worse than usual.  He denies any fever, chills, chest pain, coughing, hemoptysis, nausea vomiting, diarrhea, abdominal pain, dysuria, hematuria.  According to the patient's son, the patient is supposed to be taking furosemide 40 mg twice daily, but he only is able to take it twice a day for 3 to 4 days out of the usual week because of his numerous doctors appointments and other commitments.  Apparently, his furosemide was recently increased to 40 mg 3 times daily, but the patient has not been taking it this frequently as discussed above.  Son states that they eat out approximately 2 times per week.  There is no flow restriction.  The patient does not weigh himself.  He went to see his primary care provider today.  Because of concerns of worsening edema and anasarca, the  patient was sent to emergency department for further evaluation. In the emergency department, the patient was afebrile hemodynamically stable with oxygen saturation 96-90% on room air.  BMP showed a potassium 3.2, sodium 130, normal LFTs peer WBC was 10.4 with hemoglobin 10.3, platelets 226,000.  Chest x-ray showed increased interstitial markings, low volumes, small bilateral pleural effusions. The patient was started IV furosemide.  Assessment/Plan: Acute on chronic diastolic CHF -Patient presents with decompensation and anasarca -Pt was treated with furosemide 40 mg IV daily -Daily weights -08/27/2018 echo EF 55 to 60%, impaired relaxation, moderate aortic stenosis -Accurate I's and O's--NEG 19L -will DC IV lasix as creatinine is bumping up -will discharge on oral lasix 40 mg oral twice daily.     Intake/Output Summary (Last 24 hours) at 11/16/2018 1243 Last data filed at 11/16/2018 3329 Gross per 24 hour  Intake 723 ml  Output 4100 ml  Net -3377 ml    Peripheral arterial disease -Patient had right BKA secondary to critical limb ischemia 08/28/2018 -Case was discussed with Dr. [name] who has been following the patient's peripheral arterial disease-->no urgent intervention necessary at this time unless the patient's condition changes/worsens -Continue apixaban and aspirin, follow up with Dr. Gwenlyn Found outpatient.   Paroxysmal atrial fibrillation -Rate controlled -Continue apixaban for anticoagulation  CKD stage III -Baseline creatinine 1.0-1.3 -Monitored daily with diuresis and creatinine is 1.38 on 11/12/18  Diabetes mellitus type 2, controlled -Holding glipizide while inpatient -11/09/18 hemoglobin A1c--5.9 -05/29/2018 hemoglobin A1c 7.0 -NovoLog sliding scale used in hospital, resume home treatments at discharge, follow up with  PCP  CBG (last 3)  Recent Labs    11/15/18 2119 11/16/18 0723 11/16/18 1056  GLUCAP 153* 127* 138*    Hypothyroidism -Continue  Synthroid  Hyperlipidemia -Continue statin  Hypokalemia -Repleted -DC home on KCL 10 meq po daily    Disposition Plan:   Home    Consultants:  none  Code Status:  DNR  DVT Prophylaxis: apixaban   Procedures: As Listed in Progress Note Above  Antibiotics: None  Discharge Diagnoses:  Active Problems:   PAD (peripheral artery disease) (HCC)   RBBB   PAF (paroxysmal atrial fibrillation) (HCC)   S/P BKA (below knee amputation) unilateral, right (HCC)   Acute on chronic diastolic CHF (congestive heart failure) ([place])   Peripheral edema  Discharge Instructions:  Allergies as of 11/16/2018      Reactions   Iodinated Diagnostic Agents    Other reaction(s): Fever   Dye Fdc Red [red Dye] Hives   Iodine-131 Other (See Comments)   Breakout, fever   Iohexol     Code: RASH, Desc: PT STATES HOT ALL OVER HAD TO HAVE ICE PACKS ALL OVER HIM AND DIFF BREATHING, PT REFUSED IV DYE      Medication List    TAKE these medications   acetaminophen 325 MG tablet Commonly known as: TYLENOL Take 1-2 tablets (325-650 mg total) by mouth every 6 (six) hours as needed for mild pain (pain score 1-3 or temp > 100.5).   apixaban 2.5 MG Tabs tablet Commonly known as: Eliquis Take 1 tablet (2.5 mg total) by mouth 2 (two) times daily.   aspirin EC 81 MG tablet Take 81 mg by mouth daily.   atorvastatin 40 MG tablet Commonly known as: LIPITOR Take 1 tablet (40 mg total) by mouth daily.   dorzolamide-timolol 22.3-6.8 MG/ML ophthalmic solution Commonly known as: COSOPT Place 1 drop into both eyes 2 (two) times daily.   doxazosin 2 MG tablet Commonly known as: Cardura Take 1 tablet (2 mg total) by mouth at bedtime.   famotidine 20 MG tablet Commonly known as: PEPCID Take 1 tablet (20 mg total) by mouth daily. What changed: when to take this   finasteride 5 MG tablet Commonly known as: PROSCAR Take 1 tablet (5 mg total) by mouth every evening.   FISH OIL PO Take 1 capsule  by mouth daily.   furosemide 40 MG tablet Commonly known as: LASIX Take 1 tablet (40 mg total) by mouth 2 (two) times daily. Start taking on: November 17, 2018 What changed:   when to take this  reasons to take this   glipiZIDE 5 MG tablet Commonly known as: GLUCOTROL Take 5 mg by mouth 2 (two) times daily.   levothyroxine 50 MCG tablet Commonly known as: SYNTHROID Take 1 tablet (50 mcg total) by mouth daily before breakfast.   nystatin cream Commonly known as: MYCOSTATIN Apply 1 application topically 2 (two) times daily as needed.   potassium chloride 10 MEQ tablet Commonly known as: KLOR-Travis Take 1 tablet (10 mEq total) by mouth daily.   povidone-Iodine 5 % Soln topical solution Commonly known as: BETADINE Apply 1 application topically daily as needed for wound care.   Santyl ointment Generic drug: collagenase Apply 1 application topically daily.   tamsulosin 0.4 MG Caps capsule Commonly known as: FLOMAX Take 1 capsule (0.4 mg total) by mouth 2 (two) times daily.   vitamin C 500 MG tablet Commonly known as: ASCORBIC ACID Take 500 mg by mouth daily.   vitamin E 400 UNIT  capsule Take 400 Units by mouth daily.      Follow-up Information    Redmond School, MD Follow up on 11/17/2018.   Specialty: Internal Medicine Why: Please follow up with Dr. Gerarda Fraction on Tuesday, November 3rd at 1:00pm.  Contact information: [address] [place] [address] [phone]        [facility] Follow up.   Why: PT and RN Contact information: [address] [place] [address] [phone]       Lorretta Harp, MD. Schedule an appointment as soon as possible for a visit in 1 week(s).   Specialties: Cardiology, Radiology Why: Hospital Follow Up  Contact information: [address] [place] [address] [phone]          Allergies  Allergen Reactions  . Iodinated Diagnostic Agents     Other reaction(s):  Fever  . Dye Fdc Red [Red Dye] Hives  . Iodine-131 Other (See Comments)    Breakout, fever  . Iohexol      Code: RASH, Desc: PT STATES HOT ALL OVER HAD TO HAVE ICE PACKS ALL OVER HIM AND DIFF BREATHING, PT REFUSED IV DYE    Allergies as of 11/16/2018      Reactions   Iodinated Diagnostic Agents    Other reaction(s): Fever   Dye Fdc Red [red Dye] Hives   Iodine-131 Other (See Comments)   Breakout, fever   Iohexol     Code: RASH, Desc: PT STATES HOT ALL OVER HAD TO HAVE ICE PACKS ALL OVER HIM AND DIFF BREATHING, PT REFUSED IV DYE      Medication List    TAKE these medications   acetaminophen 325 MG tablet Commonly known as: TYLENOL Take 1-2 tablets (325-650 mg total) by mouth every 6 (six) hours as needed for mild pain (pain score 1-3 or temp > 100.5).   apixaban 2.5 MG Tabs tablet Commonly known as: Eliquis Take 1 tablet (2.5 mg total) by mouth 2 (two) times daily.   aspirin EC 81 MG tablet Take 81 mg by mouth daily.   atorvastatin 40 MG tablet Commonly known as: LIPITOR Take 1 tablet (40 mg total) by mouth daily.   dorzolamide-timolol 22.3-6.8 MG/ML ophthalmic solution Commonly known as: COSOPT Place 1 drop into both eyes 2 (two) times daily.   doxazosin 2 MG tablet Commonly known as: Cardura Take 1 tablet (2 mg total) by mouth at bedtime.   famotidine 20 MG tablet Commonly known as: PEPCID Take 1 tablet (20 mg total) by mouth daily. What changed: when to take this   finasteride 5 MG tablet Commonly known as: PROSCAR Take 1 tablet (5 mg total) by mouth every evening.   FISH OIL PO Take 1 capsule by mouth daily.   furosemide 40 MG tablet Commonly known as: LASIX Take 1 tablet (40 mg total) by mouth 2 (two) times daily. Start taking on: November 17, 2018 What changed:   when to take this  reasons to take this   glipiZIDE 5 MG tablet Commonly known as: GLUCOTROL Take 5 mg by mouth 2 (two) times daily.   levothyroxine 50 MCG tablet Commonly known as:  SYNTHROID Take 1 tablet (50 mcg total) by mouth daily before breakfast.   nystatin cream Commonly known as: MYCOSTATIN Apply 1 application topically 2 (two) times daily as needed.   potassium chloride 10 MEQ tablet Commonly known as: KLOR-Travis Take 1 tablet (10 mEq total) by mouth daily.   povidone-Iodine 5 % Soln topical  solution Commonly known as: BETADINE Apply 1 application topically daily as needed for wound care.   Santyl ointment Generic drug: collagenase Apply 1 application topically daily.   tamsulosin 0.4 MG Caps capsule Commonly known as: FLOMAX Take 1 capsule (0.4 mg total) by mouth 2 (two) times daily.   vitamin C 500 MG tablet Commonly known as: ASCORBIC ACID Take 500 mg by mouth daily.   vitamin E 400 UNIT capsule Take 400 Units by mouth daily.      Procedures/Studies: Dg Chest Port 1 View  Result Date: 11/13/2018 CLINICAL DATA:  Pleural effusion EXAM: PORTABLE CHEST 1 VIEW COMPARISON:  Under 4 days ago FINDINGS: Normal heart size. Congested appearance of the hila. Hazy appearance of the lower chest from layering pleural effusions, likely improved. There is also vascular congestion. IMPRESSION: Layering pleural effusions and vascular congestion. Aeration has likely improved from 4 days ago. Electronically Signed   By: Monte Fantasia M.D.   On: 11/13/2018 05:02   Dg Chest Port 1 View  Result Date: 11/09/2018 CLINICAL DATA:  Shortness of breath EXAM: PORTABLE CHEST 1 VIEW COMPARISON:  10/15/2017 FINDINGS: Cardiac silhouette is largely obscured. Lung volumes are low. There are patchy bibasilar airspace opacities. Probable small bilateral pleural effusions. No pneumothorax. Degenerative changes of the bilateral shoulders and spine. IMPRESSION: Low lung volumes with patchy bibasilar airspace opacities and small bilateral pleural effusions. Findings may reflect atelectasis and/or pneumonia. Electronically Signed   By: Davina Poke M.D.   On: 11/09/2018 12:12      Subjective: Pt says that he feels much better.  He doesn't want to stay in hospital any longer. He wants to go home today.  I spoke with his son who says that he has everything at home that he needs to care for him.    Discharge Exam: Vitals:   11/15/18 1416 11/15/18 2117  BP: (!) 152/60 (!) 149/56  Pulse: (!) 59 62  Resp: 17 18  Temp: 98 F (36.7 C) 98.7 F (37.1 C)  SpO2: 95% 93%   Vitals:   11/15/18 0447 11/15/18 1416 11/15/18 2117 11/16/18 0500  BP:  (!) 152/60 (!) 149/56   Pulse: 60 (!) 59 62   Resp: 18 17 18    Temp: 98 F (36.7 C) 98 F (36.7 C) 98.7 F (37.1 C)   TempSrc: Oral Oral Oral   SpO2: 94% 95% 93%   Weight:    104.7 kg  Height:       General: Pt is alert, awake, not in acute distress Cardiovascular: RRR, S1/S2 +, no rubs, no gallops Respiratory: CTA bilaterally, no wheezing, no rhonchi, no crackles Abdominal: Soft, NT, ND, bowel sounds + Extremities: trace pretibial edema,  no cyanosis, left 1st toe unchanged, left foot red, nontender and warm to touch, unchanged, s/p right BKA.    The results of significant diagnostics from this hospitalization (including imaging, microbiology, ancillary and laboratory) are listed below for reference.     Microbiology: Recent Results (from the past 240 hour(s))  SARS CORONAVIRUS 2 (TAT 6-24 HRS) Nasopharyngeal Nasopharyngeal Swab     Status: None   Collection Time: 11/09/18  1:23 PM   Specimen: Nasopharyngeal Swab  Result Value Ref Range Status   SARS Coronavirus 2 NEGATIVE NEGATIVE Final    Comment: (NOTE) SARS-CoV-2 target nucleic acids are NOT DETECTED. The SARS-CoV-2 RNA is generally detectable in upper and lower respiratory specimens during the acute phase of infection. Negative results do not preclude SARS-CoV-2 infection, do not rule out co-infections with  other pathogens, and should not be used as the sole basis for treatment or other patient management decisions. Negative results must be combined with  clinical observations, patient history, and epidemiological information. The expected result is Negative. Fact Sheet for Patients: SugarRoll.be Fact Sheet for Healthcare Providers: https://www.woods-mathews.com/ This test is not yet approved or cleared by the [place] FDA and  has been authorized for detection and/or diagnosis of SARS-CoV-2 by FDA under an Emergency Use Authorization (EUA). This EUA will remain  in effect (meaning this test can be used) for the duration of the COVID-19 declaration under Section 56 4(b)(1) of the Act, 21 U.S.C. section 360bbb-3(b)(1), unless the authorization is terminated or revoked sooner. Performed at [facility], [place] [address] [place],  [address]     Labs: BNP (last 3 results) Recent Labs    11/09/18 1209  BNP 397.6*   Basic Metabolic Panel: Recent Labs  Lab 11/11/18 0624 11/12/18 0654 11/13/18 0649 11/14/18 0639 11/15/18 0627 11/16/18 0504  NA 132* 132* 132* 133* 133* 131*  K 3.5 4.2 3.9 3.7 3.5 3.6  CL 95* 95* 95* 93* 95* 93*  CO2 27 26 26 29 29 27   GLUCOSE 138* 131* 120* 127* 123* 129*  BUN 32* 33* 33* 37* 35* 35*  CREATININE 1.36* 1.38* 1.29* 1.40* 1.33* 1.45*  CALCIUM 8.5* 8.5* 8.6* 8.7* 8.6* 8.4*  MG 2.0  --   --   --   --   --    Liver Function Tests: No results for input(s): AST, ALT, ALKPHOS, BILITOT, PROT, ALBUMIN in the last 168 hours. No results for input(s): LIPASE, AMYLASE in the last 168 hours. No results for input(s): AMMONIA in the last 168 hours. CBC: Recent Labs  Lab 11/16/18 0504  WBC 9.2  HGB 9.8*  HCT 30.4*  MCV 93.8  PLT 256   Cardiac Enzymes: No results for input(s): CKTOTAL, CKMB, CKMBINDEX, TROPONINI in the last 168 hours. BNP: Invalid input(s): POCBNP CBG: Recent Labs  Lab 11/15/18 1128 11/15/18 1554 11/15/18 2119 11/16/18 0723 11/16/18 1056  GLUCAP 172* 125* 153* 127* 138*   D-Dimer No results for input(s): DDIMER in  the last 72 hours. Hgb A1c No results for input(s): HGBA1C in the last 72 hours. Lipid Profile No results for input(s): CHOL, HDL, LDLCALC, TRIG, CHOLHDL, LDLDIRECT in the last 72 hours. Thyroid function studies No results for input(s): TSH, T4TOTAL, T3FREE, THYROIDAB in the last 72 hours.  Invalid input(s): FREET3 Anemia work up No results for input(s): VITAMINB12, FOLATE, FERRITIN, TIBC, IRON, RETICCTPCT in the last 72 hours. Urinalysis    Component Value Date/Time   COLORURINE YELLOW 09/28/2018 1502   APPEARANCEUR TURBID (A) 09/28/2018 1502   LABSPEC 1.009 09/28/2018 1502   PHURINE 5.0 09/28/2018 1502   GLUCOSEU NEGATIVE 09/28/2018 1502   HGBUR NEGATIVE 09/28/2018 1502   BILIRUBINUR NEGATIVE 09/28/2018 1502   KETONESUR NEGATIVE 09/28/2018 1502   PROTEINUR 30 (A) 09/28/2018 1502   NITRITE POSITIVE (A) 09/28/2018 1502   LEUKOCYTESUR LARGE (A) 09/28/2018 1502   Sepsis Labs Invalid input(s): PROCALCITONIN,  WBC,  LACTICIDVEN Microbiology Recent Results (from the past 240 hour(s))  SARS CORONAVIRUS 2 (TAT 6-24 HRS) Nasopharyngeal Nasopharyngeal Swab     Status: None   Collection Time: 11/09/18  1:23 PM   Specimen: Nasopharyngeal Swab  Result Value Ref Range Status   SARS Coronavirus 2 NEGATIVE NEGATIVE Final    Comment: (NOTE) SARS-CoV-2 target nucleic acids are NOT DETECTED. The SARS-CoV-2 RNA is generally detectable in upper  and lower respiratory specimens during the acute phase of infection. Negative results do not preclude SARS-CoV-2 infection, do not rule out co-infections with other pathogens, and should not be used as the sole basis for treatment or other patient management decisions. Negative results must be combined with clinical observations, patient history, and epidemiological information. The expected result is Negative. Fact Sheet for Patients: SugarRoll.be Fact Sheet for Healthcare  Providers: https://www.woods-mathews.com/ This test is not yet approved or cleared by the [place] FDA and  has been authorized for detection and/or diagnosis of SARS-CoV-2 by FDA under an Emergency Use Authorization (EUA). This EUA will remain  in effect (meaning this test can be used) for the duration of the COVID-19 declaration under Section 56 4(b)(1) of the Act, 21 U.S.C. section 360bbb-3(b)(1), unless the authorization is terminated or revoked sooner. Performed at [facility], [place] [address], [place], [place] [address]    Time coordinating discharge: 34 minutes   SIGNED:  Irwin Brakeman, MD  [facility] 11/16/2018, 12:43 PM How to contact the [facility] Attending or Consulting provider [place] or covering provider during after hours [place], for this patient?  1. Check the care team in [facility] and look for a) attending/consulting TR[facility] provider listed and b) the [place] [facility] team listed 2. Log into [email or web address] and use [place]'s universal password to access. If you do not have the password, please contact the hospital operator. 3. Locate the [facility] provider you are looking for under [facility] and page to a number that you can be directly reached. 4. If you still have difficulty reaching the provider, please page the [facility] (Director on Call) for the Hospitalists listed on amion for assistance.

## 2018-11-12 NOTE — Care Management Important Message (Signed)
Important Message  Patient Details  Name: [name] MRN: [id number] Date of Birth: Jun 08, 1927   Medicare Important Message Given:  Yes     [name] 11/12/2018, 10:45 AM

## 2018-11-12 NOTE — TOC Transition Note (Signed)
Transition of Care [facility]) - CM/SW Discharge Note   Patient Details  Name: [name] MRN: [id number] Date of Birth: 08-28-1927  Transition of Care [facility]) CM/SW Contact:  Masao Junker, Chauncey Reading, RN Phone Number: 11/12/2018, 11:00 AM   Clinical Narrative:    Patient seen by PT today, recommended to continue home health, current with [facility] for nursing. Will need to add PT to orders. Discussed with son, [name], he is agreeable. Patient has all DME needed. F/u appt scheduled.    Final next level of care: [place] Barriers to Discharge: Barriers Resolved   Patient Goals and CMS Choice Patient states their goals for this hospitalization and ongoing recovery are:: to go home with family.             HH Arranged: PT, RN [place] Agency: [place] ([place]) Date [place]: 11/12/18 Time [place]: 1100 Representative spoke with at [place]: [place] ([place]) Interventions     Readmission Risk Interventions Readmission Risk Prevention Plan 11/12/2018 11/10/2018 08/28/2018  Transportation Screening - Complete Complete  PCP or Specialist Appt within 3-5 Days - - Not Complete  Not Complete comments - - plan for SNF  HRI or [place] - - Not Complete  HRI or Home Care Consult comments - - plan for SNF  Social Work Consult for [place] Planning/Counseling - - Complete  Palliative Care Screening - - Complete  Medication Review Press photographer) - Complete Complete  PCP or Specialist appointment within 3-5 days of discharge Complete Not Complete -  HRI or [place] - Complete -  SW Recovery Care/Counseling Consult - Complete -  Palliative Care Screening - Not Complete -  [place] Not Applicable Not Complete -  Some recent data might be hidden

## 2018-11-12 NOTE — Evaluation (Signed)
Physical Therapy Evaluation Patient Details Name: [name] MRN: [id number] DOB: [birth year] Today's Date: 11/12/2018   History of Present Illness  [name] is a 85 y.o. male with medical history of diabetes mellitus type 2, diastolic CHF, CKD stage III, hypertension, right BKA, paroxysmal atrial fibrillation, peripheral arterial disease presenting with increasing lower extremity edema extending to his groin, scrotum, and lower abdomen.  The patient normally has lower extreme edema, but the son states that it has significantly worsened in the past week.  Patient has had some intermittent shortness of breath, but states that this is not any worse than usual.  He denies any fever, chills, chest pain, coughing, hemoptysis, nausea vomiting, diarrhea, abdominal pain, dysuria, hematuria.  According to the patient's son, the patient is supposed to be taking furosemide 40 mg twice daily, but he only is able to take it twice a day for 3 to 4 days out of the usual week because of his numerous doctors appointments and other commitments.  Apparently, his furosemide was recently increased to 40 mg 3 times daily, but the patient has not been taking it this frequently as discussed above.  Son states that they eat out approximately 2 times per week.  There is no flow restriction.  The patient does not weigh himself.  He went to see his primary care provider today.  Because of concerns of worsening edema and anasarca, the patient was sent to emergency department for further evaluation.In the emergency department, the patient was afebrile hemodynamically stable with oxygen saturation 96-90% on room air.  BMP showed a potassium 3.2, sodium 130, normal LFTs peer WBC was 10.4 with hemoglobin 10.3, platelets 226,000.  Chest x-ray showed increased interstitial markings, low volumes, small bilateral pleural effusions.  The patient was started IV furosemide.    Clinical Impression  Patient functioning at baseline for  functional mobility and gait, non-ambulatory, uses w/c for mobility and mechanical lift for transfers at home, and states he will be receiving a RLE BKA prosthetic leg today.  Patient able to partially stand using RW, but unable to fully left knee due weakness, demonstrates good return for using BUE for scooting laterally at bedside and put back to bed after therapy.  Patient to be discharged home today and discharged from physical therapy to care of nursing for OOB daily as tolerated using mechanical lift for length of stay.     Follow Up Recommendations Home health PT;Supervision for mobility/OOB;Supervision - Intermittent    Equipment Recommendations  None recommended by PT    Recommendations for Other Services       Precautions / Restrictions Precautions Precautions: Fall Restrictions Weight Bearing Restrictions: No      Mobility  Bed Mobility Overal bed mobility: Needs Assistance Bed Mobility: Supine to Sit;Sit to Supine     Supine to sit: Mod assist Sit to supine: Min assist   General bed mobility comments: slow labored movement with head of bed raised  Transfers Overall transfer level: Needs assistance Equipment used: Rolling walker (2 wheeled) Transfers: Sit to/from Stand Sit to Stand: Max assist         General transfer comment: able to partially stand, unble to fully lock left knee due to weakness  Ambulation/Gait                Stairs            Wheelchair Mobility    Modified Rankin (Stroke Patients Only)       Balance Overall balance  assessment: Needs assistance Sitting-balance support: Feet supported;No upper extremity supported Sitting balance-Leahy Scale: Fair Sitting balance - Comments: seated at EOB   Standing balance support: Bilateral upper extremity supported;During functional activity Standing balance-Leahy Scale: Poor Standing balance comment: using RW                             Pertinent Vitals/Pain Pain  Assessment: No/denies pain    Home Living Family/patient expects to be discharged to:: Private residence Living Arrangements: Children Available Help at Discharge: Family;Available 24 hours/day Type of Home: House Home Access: Ramped entrance     Home Layout: One level Home Equipment: Walker - 2 wheels;Wheelchair - [employer];Hospital bed Additional Comments: hoyer lift    Prior Function Level of Independence: Needs assistance   Gait / Transfers Assistance Needed: non-ambulatory, uses mechanical lift for transfers, assisted for bed mobility with head of bed raised, uses manual w/c for household distances  ADL's / Homemaking Assistance Needed: sponge bath, uses depends most of time, ADL's assisted by family        Hand Dominance   Dominant Hand: Right    Extremity/Trunk Assessment   Upper Extremity Assessment Upper Extremity Assessment: Generalized weakness    Lower Extremity Assessment Lower Extremity Assessment: Generalized weakness    Cervical / Trunk Assessment Cervical / Trunk Assessment: Normal  Communication   Communication: No difficulties  Cognition Arousal/Alertness: Awake/alert Behavior During Therapy: WFL for tasks assessed/performed Overall Cognitive Status: Within Functional Limits for tasks assessed                                        General Comments      Exercises     Assessment/Plan    PT Assessment All further PT needs can be met in the next venue of care  PT Problem List Decreased strength;Decreased activity tolerance;Decreased balance;Decreased mobility       PT Treatment Interventions      PT Goals (Current goals can be found in the Care Plan section)  Acute Rehab PT Goals Patient Stated Goal: return home with family to assist, have HHPT work with him using RLE BKA prosthetic leg PT Goal Formulation: With patient Time For Goal Achievement: 11/12/18 Potential to Achieve Goals: Fair    Frequency      Barriers to discharge        Co-evaluation               AM-PAC PT "6 Clicks" Mobility  Outcome Measure Help needed turning from your back to your side while in a flat bed without using bedrails?: A Little Help needed moving from lying on your back to sitting on the side of a flat bed without using bedrails?: A Lot Help needed moving to and from a bed to a chair (including a wheelchair)?: Total Help needed standing up from a chair using your arms (e.g., wheelchair or bedside chair)?: A Lot Help needed to walk in hospital room?: Total Help needed climbing 3-5 steps with a railing? : Total 6 Click Score: 10    End of Session   Activity Tolerance: Patient tolerated treatment well;Patient limited by fatigue Patient left: in bed;with call bell/phone within reach;with bed alarm set Nurse Communication: Mobility status PT Visit Diagnosis: Unsteadiness on feet (R26.81);Other abnormalities of gait and mobility (R26.89);Muscle weakness (generalized) (M62.81)    Time: 5625-6389 PT Time  Calculation (min) (ACUTE ONLY): 31 min   Charges:   PT Evaluation $PT Eval Moderate Complexity: 1 Mod PT Treatments $Therapeutic Activity: 23-37 mins        10:47 AM, 11/12/18 Lonell Grandchild, MPT Physical Therapist with [facility] 336 [phone] office [phone] mobile phone

## 2018-11-12 NOTE — Discharge Instructions (Signed)
IMPORTANT INFORMATION: PAY CLOSE ATTENTION  ? ?PHYSICIAN DISCHARGE INSTRUCTIONS ? ?Follow with Primary care provider  Fusco, Lawrence, MD  and other consultants as instructed by your Hospitalist Physician ? ?SEEK MEDICAL CARE OR RETURN TO EMERGENCY ROOM IF SYMPTOMS COME BACK, WORSEN OR NEW PROBLEM DEVELOPS  ? ?Please note: ?You were cared for by a hospitalist during your hospital stay. Every effort will be made to forward records to your primary care provider.  You can request that your primary care provider send for your hospital records if they have not received them.  Once you are discharged, your primary care physician will handle any further medical issues. Please note that NO REFILLS for any discharge medications will be authorized once you are discharged, as it is imperative that you return to your primary care physician (or establish a relationship with a primary care physician if you do not have one) for your post hospital discharge needs so that they can reassess your need for medications and monitor your lab values. ? ?Please get a complete blood count and chemistry panel checked by your Primary MD at your next visit, and again as instructed by your Primary MD. ? ?Get Medicines reviewed and adjusted: ?Please take all your medications with you for your next visit with your Primary MD ? ?Laboratory/radiological data: ?Please request your Primary MD to go over all hospital tests and procedure/radiological results at the follow up, please ask your primary care provider to get all Hospital records sent to his/her office. ? ?In some cases, they will be blood work, cultures and biopsy results pending at the time of your discharge. Please request that your primary care provider follow up on these results. ? ?If you are diabetic, please bring your blood sugar readings with you to your follow up appointment with primary care.   ? ?Please call and make your follow up appointments as soon as possible.   ? ?Also Note  the following: ?If you experience worsening of your admission symptoms, develop shortness of breath, life threatening emergency, suicidal or homicidal thoughts you must seek medical attention immediately by calling 911 or calling your MD immediately  if symptoms less severe. ? ?You must read complete instructions/literature along with all the possible adverse reactions/side effects for all the Medicines you take and that have been prescribed to you. Take any new Medicines after you have completely understood and accpet all the possible adverse reactions/side effects.  ? ?Do not drive when taking Pain medications or sleeping medications (Benzodiazepines) ? ?Do not take more than prescribed Pain, Sleep and Anxiety Medications. It is not advisable to combine anxiety,sleep and pain medications without talking with your primary care practitioner ? ?Special Instructions: If you have smoked or chewed Tobacco  in the last 2 yrs please stop smoking, stop any regular Alcohol  and or any Recreational drug use. ? ?Wear Seat belts while driving.  Do not drive if taking any narcotic, mind altering or controlled substances or recreational drugs or alcohol.  ? ? ? ? ? ?

## 2018-11-13 ENCOUNTER — Inpatient Hospital Stay (HOSPITAL_COMMUNITY): Payer: Medicare Other

## 2018-11-13 DIAGNOSIS — E1122 Type 2 diabetes mellitus with diabetic chronic kidney disease: Secondary | ICD-10-CM | POA: Diagnosis not present

## 2018-11-13 DIAGNOSIS — I4891 Unspecified atrial fibrillation: Secondary | ICD-10-CM | POA: Diagnosis not present

## 2018-11-13 DIAGNOSIS — I129 Hypertensive chronic kidney disease with stage 1 through stage 4 chronic kidney disease, or unspecified chronic kidney disease: Secondary | ICD-10-CM | POA: Diagnosis not present

## 2018-11-13 DIAGNOSIS — N183 Chronic kidney disease, stage 3 unspecified: Secondary | ICD-10-CM | POA: Diagnosis not present

## 2018-11-13 LAB — BASIC METABOLIC PANEL
Anion gap: 11 (ref 5–15)
BUN: 33 mg/dL — ABNORMAL HIGH (ref 8–23)
CO2: 26 mmol/L (ref 22–32)
Calcium: 8.6 mg/dL — ABNORMAL LOW (ref 8.9–10.3)
Chloride: 95 mmol/L — ABNORMAL LOW (ref 98–111)
Creatinine, Ser: 1.29 mg/dL — ABNORMAL HIGH (ref 0.61–1.24)
GFR calc Af Amer: 57 mL/min — ABNORMAL LOW (ref >60)
GFR calc non Af Amer: 49 mL/min — ABNORMAL LOW (ref >60)
Glucose, Bld: 120 mg/dL — ABNORMAL HIGH (ref 70–99)
Potassium: 3.9 mmol/L (ref 3.5–5.1)
Sodium: 132 mmol/L — ABNORMAL LOW (ref 135–145)

## 2018-11-13 LAB — GLUCOSE, CAPILLARY
Glucose-Capillary: 117 mg/dL — ABNORMAL HIGH (ref 70–99)
Glucose-Capillary: 158 mg/dL — ABNORMAL HIGH (ref 70–99)
Glucose-Capillary: 160 mg/dL — ABNORMAL HIGH (ref 70–99)
Glucose-Capillary: 156 mg/dL — ABNORMAL HIGH (ref 70–99)

## 2018-11-13 MED ORDER — FUROSEMIDE 10 MG/ML IJ SOLN
40.0000 mg | Freq: Two times a day (BID) | INTRAMUSCULAR | Status: DC
  Administered 2018-11-13 – 2018-11-14 (×3): 40 mg via INTRAVENOUS
  Filled 2018-11-13 (×3): qty 4

## 2018-11-13 NOTE — Progress Notes (Signed)
PROGRESS NOTE  [name] OVF:[id number] DOB: 01-May-1927 DOA: 11/09/2018 PCP: Redmond School, MD  Brief History:  85 y.o. male with medical history of diabetes mellitus type 2, diastolic CHF, CKD stage III, hypertension, right BKA, paroxysmal atrial fibrillation, peripheral arterial disease presenting with increasing lower extremity edema extending to his groin, scrotum, and lower abdomen.  The patient normally has lower extreme edema, but the son states that it has significantly worsened in the past week.  Patient has had some intermittent shortness of breath, but states that this is not any worse than usual.  He denies any fever, chills, chest pain, coughing, hemoptysis, nausea vomiting, diarrhea, abdominal pain, dysuria, hematuria.  According to the patient's son, the patient is supposed to be taking furosemide 40 mg twice daily, but he only is able to take it twice a day for 3 to 4 days out of the usual week because of his numerous doctors appointments and other commitments.  Apparently, his furosemide was recently increased to 40 mg 3 times daily, but the patient has not been taking it this frequently as discussed above.  Son states that they eat out approximately 2 times per week.  There is no flow restriction.  The patient does not weigh himself.  He went to see his primary care provider on 10/26.  Because of concerns of worsening edema and anasarca, the patient was sent to emergency department for further evaluation. In the emergency department, the patient was afebrile hemodynamically stable with oxygen saturation 96-98% on room air.  BMP showed a potassium 3.2, sodium 130, normal LFTs peer WBC was 10.4 with hemoglobin 10.3, platelets 226,000.  Chest x-ray showed increased interstitial markings, low volumes, small bilateral pleural effusions.  The patient was started IV furosemide.  Assessment/Plan: Acute on chronic diastolic CHF -Patient presents with decompensation and anasarca  -Continue furosemide 40 mg IV BID -Daily weights -08/27/2018 echo EF 55 to 60%, impaired relaxation, moderate aortic stenosis -Accurate I's and O's--NEG 5.5L -Urine protein creatinine ratio  Filed Weights   11/11/18 0500 11/12/18 0500 11/12/18 0523  Weight: 110.1 kg 110 kg 109.1 kg   Peripheral arterial disease -Patient had right BKA secondary to critical limb ischemia 08/28/2018 -Case was discussed with Dr. [name] who has been following the patient's peripheral arterial disease--> no urgent intervention necessary at this time unless the patient's condition changes/worsens -Continue apixaban and aspirin, follow up with Dr. Gwenlyn Found outpatient.   Paroxysmal atrial fibrillation -Rate controlled -Continue apixaban  CKD stage III -Baseline creatinine 1.0-1.3 -Monitor with diuresis  Diabetes mellitus type 2, controlled -Holding glipizide -11/09/18 hemoglobin A1c--5.9 -05/29/2018 hemoglobin A1c 7.0 -NovoLog sliding scale  CBG (last 3)  Recent Labs    11/12/18 2024 11/13/18 0729 11/13/18 1059  GLUCAP 164* 117* 158*   Hypothyroidism -Continue Synthroid  Hyperlipidemia -Continue statin  Hypokalemia -Repleted -Check magnesium  Disposition Plan:    Family Communication:   Son updated at bedside 10/30  Consultants:  none  Code Status:  DNR  DVT Prophylaxis: apixaban   Procedures: As Listed in Progress Note Above  Antibiotics: None  Subjective: Patient denies chest pain, he is still urinating frequently   Objective: Vitals:   11/12/18 1505 11/12/18 1957 11/12/18 2113 11/13/18 0527  BP:   (!) 144/59 132/62  Pulse:   63 (!) 58  Resp:   17 17  Temp:   98.2 F (36.8 C) 97.8 F (36.6 C)  TempSrc:    Oral  SpO2:  97% 92% 96% 94%  Weight:      Height:        Intake/Output Summary (Last 24 hours) at 11/13/2018 1113 Last data filed at 11/13/2018 1000 Gross per 24 hour  Intake 846 ml  Output 3850 ml  Net -3004 ml   Weight change:  Exam:    General:  Pt is alert, follows commands appropriately, not in acute distress  HEENT: No icterus, No thrush, No neck mass, [place]/AT  Cardiovascular: RRR, S1/S2, no rubs, no gallops  Respiratory: bibasilar crackles, no wheeze  Abdomen: Soft/+BS, non tender, non distended, no guarding  GU: scrotal edema 1++  Extremities: 1+ LLE edema, No lymphangitis, No petechiae, No rashes, no synovitis, left foot red, nontender, warm to touch, left 1st toe unchanged.   Data Reviewed: I have personally reviewed following labs and imaging studies Basic Metabolic Panel: Recent Labs  Lab 11/09/18 1209 11/10/18 0655 11/11/18 0624 11/12/18 0654 11/13/18 0649  NA 130* 131* 132* 132* 132*  K 3.2* 3.0* 3.5 4.2 3.9  CL 90* 96* 95* 95* 95*  CO2 27 23 27 26 26   GLUCOSE 77 94 138* 131* 120*  BUN 28* 28* 32* 33* 33*  CREATININE 1.29* 1.32* 1.36* 1.38* 1.29*  CALCIUM 8.7* 8.3* 8.5* 8.5* 8.6*  MG  --   --  2.0  --   --    Liver Function Tests: Recent Labs  Lab 11/09/18 1209  AST 32  ALT 21  ALKPHOS 39  BILITOT 1.0  PROT 6.7  ALBUMIN 3.2*   No results for input(s): LIPASE, AMYLASE in the last 168 hours. No results for input(s): AMMONIA in the last 168 hours. Coagulation Profile: No results for input(s): INR, PROTIME in the last 168 hours. CBC: Recent Labs  Lab 11/09/18 1209  WBC 10.4  NEUTROABS 6.4  HGB 10.3*  HCT 31.1*  MCV 93.1  PLT 226   Cardiac Enzymes: No results for input(s): CKTOTAL, CKMB, CKMBINDEX, TROPONINI in the last 168 hours. BNP: Invalid input(s): POCBNP CBG: Recent Labs  Lab 11/12/18 1059 11/12/18 1604 11/12/18 2024 11/13/18 0729 11/13/18 1059  GLUCAP 143* 152* 164* 117* 158*   HbA1C: No results for input(s): HGBA1C in the last 72 hours. Urine analysis:  Recent Results (from the past 240 hour(s))  SARS CORONAVIRUS 2 (TAT 6-24 HRS) Nasopharyngeal Nasopharyngeal Swab     Status: None   Collection Time: 11/09/18  1:23 PM   Specimen: Nasopharyngeal Swab   Result Value Ref Range Status   SARS Coronavirus 2 NEGATIVE NEGATIVE Final    Comment: (NOTE) SARS-CoV-2 target nucleic acids are NOT DETECTED. The SARS-CoV-2 RNA is generally detectable in upper and lower respiratory specimens during the acute phase of infection. Negative results do not preclude SARS-CoV-2 infection, do not rule out co-infections with other pathogens, and should not be used as the sole basis for treatment or other patient management decisions. Negative results must be combined with clinical observations, patient history, and epidemiological information. The expected result is Negative. Fact Sheet for Patients: SugarRoll.be Fact Sheet for Healthcare Providers: https://www.woods-mathews.com/ This test is not yet approved or cleared by the [place] FDA and  has been authorized for detection and/or diagnosis of SARS-CoV-2 by FDA under an Emergency Use Authorization (EUA). This EUA will remain  in effect (meaning this test can be used) for the duration of the COVID-19 declaration under Section 56 4(b)(1) of the Act, 21 U.S.C. section 360bbb-3(b)(1), unless the authorization is terminated or revoked sooner. Performed at [facility],  [address] [place], [place] [address]      Scheduled Meds: . apixaban  5 mg Oral BID  . aspirin EC  81 mg Oral Daily  . atorvastatin  40 mg Oral Daily  . dorzolamide-timolol  1 drop Both Eyes BID  . doxazosin  2 mg Oral QHS  . famotidine  20 mg Oral Daily  . finasteride  5 mg Oral QPM  . furosemide  40 mg Intravenous Q12H  . insulin aspart  0-9 Units Subcutaneous TID WC  . levothyroxine  50 mcg Oral QAC breakfast  . sodium chloride flush  3 mL Intravenous Q12H  . tamsulosin  0.4 mg Oral BID  . vitamin C  500 mg Oral Daily  . vitamin E  400 Units Oral Daily   Continuous Infusions: . sodium chloride      Procedures/Studies: US Arterial Abi (screening Lower Extremity)   Result Date: 10/15/2018 CLINICAL DATA:  85 year old male with a history of leg cellulitis EXAM: NONINVASIVE PHYSIOLOGIC VASCULAR STUDY OF BILATERAL LOWER EXTREMITIES TECHNIQUE: Evaluation of both lower extremities was performed at rest, including calculation of ankle-brachial indices, multiple segmental pressure evaluation, segmental Doppler and segmental pulse volume recording. COMPARISON:  None. FINDINGS: Right ABI:  Right below-the-knee amputation Left ABI:  0.51 Right Lower Extremity:  Right below-knee amputation Left Lower Extremity: Segmental Doppler at the left ankle demonstrates monophasic waveforms. IMPRESSION: Resting ABI on the left in the moderate range arterial occlusive disease with segmental exam demonstrating significant arterial occlusive disease above the ankle. Signed, Dulcy Fanny. Dellia Nims, RPVI [facility] Electronically Signed   By: Corrie Mckusick D.O.   On: 10/15/2018 07:52   Dg Chest Port 1 View  Result Date: 11/13/2018 CLINICAL DATA:  Pleural effusion EXAM: PORTABLE CHEST 1 VIEW COMPARISON:  Under 4 days ago FINDINGS: Normal heart size. Congested appearance of the hila. Hazy appearance of the lower chest from layering pleural effusions, likely improved. There is also vascular congestion. IMPRESSION: Layering pleural effusions and vascular congestion. Aeration has likely improved from 4 days ago. Electronically Signed   By: Monte Fantasia M.D.   On: 11/13/2018 05:02   Dg Chest Port 1 View  Result Date: 11/09/2018 CLINICAL DATA:  Shortness of breath EXAM: PORTABLE CHEST 1 VIEW COMPARISON:  10/15/2017 FINDINGS: Cardiac silhouette is largely obscured. Lung volumes are low. There are patchy bibasilar airspace opacities. Probable small bilateral pleural effusions. No pneumothorax. Degenerative changes of the bilateral shoulders and spine. IMPRESSION: Low lung volumes with patchy bibasilar airspace opacities and small bilateral  pleural effusions. Findings may reflect atelectasis and/or pneumonia. Electronically Signed   By: Davina Poke M.D.   On: 11/09/2018 12:12    Irwin Brakeman, MD [facility] How to contact the [facility] Attending or Consulting provider [place] or covering provider during after hours [place], for this patient?  1. Check the care team in [facility] [place] and look for a) attending/consulting [facility] provider listed and b) the [facility] [place] team listed 2. Log into [email or web address] and use [place]'s universal password to access. If you do not have the password, please contact the hospital operator. 3. Locate the [facility] provider you are looking for under [facility] and page to a number that you can be directly reached. 4. If you still have difficulty reaching the provider, please page the [facility] (Director on Call) for the Hospitalists listed on amion for assistance.   If 7PM-7AM, please contact night-coverage [email or web address] Password [email or web address] 11/13/2018, 11:13 AM  LOS: 4 days

## 2018-11-13 NOTE — Care Management Important Message (Signed)
Important Message  Patient Details  Name: [name] MRN: [id number] Date of Birth: Apr 19, 1927   Medicare Important Message Given:  Yes     [name] 11/13/2018, 12:20 PM

## 2018-11-13 NOTE — Evaluation (Signed)
Clinical/Bedside Swallow Evaluation Patient Details  Name: [name] MRN: [id number] Date of Birth: 1927-10-15  Today's Date: 11/13/2018 Time: SLP Start Time (ACUTE ONLY): 1004 SLP Stop Time (ACUTE ONLY): 1023 SLP Time Calculation (min) (ACUTE ONLY): 19 min  Past Medical History:  Past Medical History:  Diagnosis Date  . Arthritis   . Borderline diabetic   . Glaucoma   . Hyperlipidemia   . Hypertension   . PVD (peripheral vascular disease) ([place])   . Sleep apnea    Cpap ordered but doesnt use   Past Surgical History:  Past Surgical History:  Procedure Laterality Date  . ABDOMINAL AORTOGRAM W/LOWER EXTREMITY N/A 07/27/2018   Procedure: ABDOMINAL AORTOGRAM W/LOWER EXTREMITY;  Surgeon: Lorretta Harp, MD;  Location: [facility];  Service: Cardiovascular;  Laterality: N/A;  . AMPUTATION Right 08/28/2018   Procedure: RIGHT BELOW KNEE AMPUTATION;  Surgeon: [name], MD;  Location: [facility];  Service: Orthopedics;  Laterality: Right;  . APPENDECTOMY  1943  . CATARACT EXTRACTION  2012   x2  . COLONOSCOPY N/A 11/01/2014   Procedure: COLONOSCOPY;  Surgeon: Aviva Signs, MD;  [facility];  Service: Gastroenterology;  Laterality: N/A;  . ESOPHAGOGASTRODUODENOSCOPY N/A 11/01/2014   Procedure: ESOPHAGOGASTRODUODENOSCOPY (EGD);  Surgeon: Aviva Signs, MD;  [facility];  Service: Gastroenterology;  Laterality: N/A;  . HERNIA REPAIR Left   . INGUINAL HERNIA REPAIR Right 03/01/2013   Procedure: RIGHT INGUINAL HERNIORRHAPHY;  Surgeon: Jamesetta So, MD;  Location: [facility];  Service: General;  Laterality: Right;  . INSERTION OF MESH Right 03/01/2013   Procedure: INSERTION OF MESH;  Surgeon: Jamesetta So, MD;  Location: [facility];  Service: General;  Laterality: Right;  . LOWER EXTREMITY ANGIOGRAPHY Right 08/25/2018   Procedure: LOWER EXTREMITY ANGIOGRAPHY;  Surgeon: Wellington Hampshire, MD;  Location: [facility];  Service: Cardiovascular;  Laterality:  Right;  . NM MYOCAR PERF WALL MOTION  06/05/2010   no significant ischemia  . PERIPHERAL VASCULAR ATHERECTOMY Right 08/03/2018   Procedure: PERIPHERAL VASCULAR ATHERECTOMY;  Surgeon: Lorretta Harp, MD;  Location: [facility];  Service: Cardiovascular;  Laterality: Right;  . stent  06/14/2010   left leg  . US ECHOCARDIOGRAPHY  01/21/2006   moderate mitral annular ca+, mild MR, AOV moderately sclerotic.   HPI:  [name] is a 85 y.o. male with medical history of diabetes mellitus type 2, diastolic CHF, CKD stage III, hypertension, right BKA, paroxysmal atrial fibrillation, peripheral arterial disease presenting with increasing lower extremity edema extending to his groin, scrotum, and lower abdomen.  The patient normally has lower extreme edema, but the son states that it has significantly worsened in the past week.  Patient has had some intermittent shortness of breath, but states that this is not any worse than usual. He went to see his primary care provider today.  Because of concerns of worsening edema and anasarca, the patient was sent to emergency department for further evaluation.   Assessment / Plan / Recommendation Clinical Impression  Administered clinical swallowing evaluation (CSE) and Pt consumed regular, puree and thin liquids without overt s/sx of oropharyngeal dysphagia. Pt did report that he does "get strangled sometimes" and SLP reviewed universal aspiration precautions including recommendations to utilize slow consumption rate and alternate bites and sips. Pt's son, [name] arrived as SLP was completing CSE and SLP reviewed recommendations with him. There are no further ST needs noted at this time, ST to sign off.  SLP Visit Diagnosis: Dysphagia, unspecified (R13.10)    Aspiration Risk  Mild aspiration risk    Diet Recommendation Regular;Thin liquid   Liquid Administration via: Cup;Straw Medication Administration: Whole meds with liquid Supervision: Patient able  to self feed Compensations: Minimize environmental distractions;Slow rate;Follow solids with liquid Postural Changes: Seated upright at 90 degrees;Remain upright for at least 30 minutes after po intake    Other  Recommendations Oral Care Recommendations: Oral care BID   Follow up Recommendations None             Prognosis Prognosis for Safe Diet Advancement: Good      Swallow Study   General Date of Onset: 11/09/18 HPI: [name] is a 85 y.o. male with medical history of diabetes mellitus type 2, diastolic CHF, CKD stage III, hypertension, right BKA, paroxysmal atrial fibrillation, peripheral arterial disease presenting with increasing lower extremity edema extending to his groin, scrotum, and lower abdomen.  The patient normally has lower extreme edema, but the son states that it has significantly worsened in the past week.  Patient has had some intermittent shortness of breath, but states that this is not any worse than usual. He went to see his primary care provider today.  Because of concerns of worsening edema and anasarca, the patient was sent to emergency department for further evaluation. Type of Study: Bedside Swallow Evaluation Previous Swallow Assessment: none in chart  Diet Prior to this Study: Thin liquids;Regular Temperature Spikes Noted: No Respiratory Status: Room air History of Recent Intubation: No Behavior/Cognition: Alert;Cooperative;Pleasant mood Oral Cavity Assessment: Within Functional Limits Oral Care Completed by SLP: Recent completion by staff Oral Cavity - Dentition: Dentures, top;Dentures, bottom Vision: Functional for self-feeding Self-Feeding Abilities: Able to feed self;Needs assist;Needs set up Patient Positioning: Upright in bed Baseline Vocal Quality: Normal Volitional Cough: Strong Volitional Swallow: Able to elicit    Oral/Motor/Sensory Function Overall Oral Motor/Sensory Function: Within functional limits   Ice Chips Ice chips: Within  functional limits   Thin Liquid Thin Liquid: Within functional limits    Nectar Thick Nectar Thick Liquid: Not tested   Honey Thick Honey Thick Liquid: Not tested   Puree Puree: Within functional limits Presentation: [place]. Roddie Mc, CCC-SLP Speech Language Pathologist  Solid: Within functional limits Presentation: Self Fed      Wende Bushy 11/13/2018,10:32 AM

## 2018-11-14 LAB — GLUCOSE, CAPILLARY
Glucose-Capillary: 116 mg/dL — ABNORMAL HIGH (ref 70–99)
Glucose-Capillary: 165 mg/dL — ABNORMAL HIGH (ref 70–99)
Glucose-Capillary: 148 mg/dL — ABNORMAL HIGH (ref 70–99)
Glucose-Capillary: 154 mg/dL — ABNORMAL HIGH (ref 70–99)

## 2018-11-14 LAB — BASIC METABOLIC PANEL
Anion gap: 11 (ref 5–15)
BUN: 37 mg/dL — ABNORMAL HIGH (ref 8–23)
CO2: 29 mmol/L (ref 22–32)
Calcium: 8.7 mg/dL — ABNORMAL LOW (ref 8.9–10.3)
Chloride: 93 mmol/L — ABNORMAL LOW (ref 98–111)
Creatinine, Ser: 1.4 mg/dL — ABNORMAL HIGH (ref 0.61–1.24)
GFR calc Af Amer: 51 mL/min — ABNORMAL LOW (ref >60)
GFR calc non Af Amer: 44 mL/min — ABNORMAL LOW (ref >60)
Glucose, Bld: 127 mg/dL — ABNORMAL HIGH (ref 70–99)
Potassium: 3.7 mmol/L (ref 3.5–5.1)
Sodium: 133 mmol/L — ABNORMAL LOW (ref 135–145)

## 2018-11-14 MED ORDER — APIXABAN 2.5 MG PO TABS
2.5000 mg | ORAL_TABLET | Freq: Two times a day (BID) | ORAL | Status: DC
  Administered 2018-11-14 – 2018-11-16 (×4): 2.5 mg via ORAL
  Filled 2018-11-14 (×4): qty 1

## 2018-11-14 MED ORDER — FUROSEMIDE 10 MG/ML IJ SOLN
40.0000 mg | Freq: Every day | INTRAMUSCULAR | Status: DC
  Administered 2018-11-15: 40 mg via INTRAVENOUS
  Filled 2018-11-14: qty 4

## 2018-11-14 NOTE — Progress Notes (Signed)
PROGRESS NOTE  [name]:[id number] DOB: 1927/08/08 DOA: 11/09/2018 PCP: Redmond School, MD  Brief History:  85 y.o. male with medical history of diabetes mellitus type 2, diastolic CHF, CKD stage III, hypertension, right BKA, paroxysmal atrial fibrillation, peripheral arterial disease presenting with increasing lower extremity edema extending to his groin, scrotum, and lower abdomen.  The patient normally has lower extreme edema, but the son states that it has significantly worsened in the past week.  Patient has had some intermittent shortness of breath, but states that this is not any worse than usual.  He denies any fever, chills, chest pain, coughing, hemoptysis, nausea vomiting, diarrhea, abdominal pain, dysuria, hematuria.  According to the patient's son, the patient is supposed to be taking furosemide 40 mg twice daily, but he only is able to take it twice a day for 3 to 4 days out of the usual week because of his numerous doctors appointments and other commitments.  Apparently, his furosemide was recently increased to 40 mg 3 times daily, but the patient has not been taking it this frequently as discussed above.  Son states that they eat out approximately 2 times per week.  There is no flow restriction.  The patient does not weigh himself.  He went to see his primary care provider on 10/26.  Because of concerns of worsening edema and anasarca, the patient was sent to emergency department for further evaluation. In the emergency department, the patient was afebrile hemodynamically stable with oxygen saturation 96-98% on room air.  BMP showed a potassium 3.2, sodium 130, normal LFTs peer WBC was 10.4 with hemoglobin 10.3, platelets 226,000.  Chest x-ray showed increased interstitial markings, low volumes, small bilateral pleural effusions.  The patient was started IV furosemide.  Assessment/Plan: Acute on chronic diastolic CHF -Patient presents with decompensation and anasarca  -Continue furosemide 40 mg IV BID -Daily weights -08/27/2018 echo EF 55 to 60%, impaired relaxation, moderate aortic stenosis -Accurate I's and O's--NEG 5.5L -Urine protein creatinine ratio  Filed Weights   11/12/18 0500 11/12/18 0523 11/14/18 0509  Weight: 110 kg 109.1 kg 105.6 kg   Peripheral arterial disease -Patient had right BKA secondary to critical limb ischemia 08/28/2018 -Case was discussed with Dr. [name] who has been following the patient's peripheral arterial disease--> no urgent intervention necessary at this time unless the patient's condition changes/worsens -Continue apixaban and aspirin, follow up with Dr. Gwenlyn Found outpatient.   Paroxysmal atrial fibrillation -Rate controlled -Continue apixaban  CKD stage III -Baseline creatinine 1.0-1.3 -Monitor with diuresis  Diabetes mellitus type 2, controlled -Holding glipizide -11/09/18 hemoglobin A1c--5.9 -05/29/2018 hemoglobin A1c 7.0 -NovoLog sliding scale  CBG (last 3)  Recent Labs    11/13/18 2139 11/14/18 0743 11/14/18 1053  GLUCAP 156* 116* 165*   Hypothyroidism -Continue Synthroid  Hyperlipidemia -Continue statin  Hypokalemia -Repleted -Check magnesium  Disposition Plan:    Family Communication:   Son updated at bedside 10/30  Consultants:  none  Code Status:  DNR  DVT Prophylaxis: apixaban  Procedures: As Listed in Progress Note Above  Antibiotics: None  Subjective: Patient denies chest pain, he is still urinating frequently   Objective: Vitals:   11/13/18 2022 11/13/18 2138 11/14/18 0509 11/14/18 1330  BP:  (!) 142/62 (!) 134/54 (!) 143/61  Pulse:  69 61 63  Resp:  17 17   Temp:  98.2 F (36.8 C) 98.1 F (36.7 C) 98 F (36.7 C)  TempSrc:  Oral Oral  Oral  SpO2: 93% 99% 97% 96%  Weight:   105.6 kg   Height:        Intake/Output Summary (Last 24 hours) at 11/14/2018 1605 Last data filed at 11/14/2018 1213 Gross per 24 hour  Intake 360 ml  Output 4650 ml  Net  -4290 ml   Weight change:  Exam:   General:  Pt is alert, follows commands appropriately, not in acute distress  HEENT: No icterus, No thrush, No neck mass, [place]/AT  Cardiovascular: normal S1/S2, no rubs, no gallops  Respiratory: no crackles, no wheezes  Abdomen: Soft/+BS, non tender, non distended, no guarding  GU: scrotal edema 1++  Extremities: trace LLE edema, No lymphangitis, No petechiae, No rashes, no synovitis, left foot red, nontender, warm to touch, left 1st toe unchanged.   Data Reviewed: I have personally reviewed following labs and imaging studies Basic Metabolic Panel: Recent Labs  Lab 11/10/18 0655 11/11/18 0624 11/12/18 0654 11/13/18 0649 11/14/18 0639  NA 131* 132* 132* 132* 133*  K 3.0* 3.5 4.2 3.9 3.7  CL 96* 95* 95* 95* 93*  CO2 23 27 26 26 29   GLUCOSE 94 138* 131* 120* 127*  BUN 28* 32* 33* 33* 37*  CREATININE 1.32* 1.36* 1.38* 1.29* 1.40*  CALCIUM 8.3* 8.5* 8.5* 8.6* 8.7*  MG  --  2.0  --   --   --    Liver Function Tests: Recent Labs  Lab 11/09/18 1209  AST 32  ALT 21  ALKPHOS 39  BILITOT 1.0  PROT 6.7  ALBUMIN 3.2*   No results for input(s): LIPASE, AMYLASE in the last 168 hours. No results for input(s): AMMONIA in the last 168 hours. Coagulation Profile: No results for input(s): INR, PROTIME in the last 168 hours. CBC: Recent Labs  Lab 11/09/18 1209  WBC 10.4  NEUTROABS 6.4  HGB 10.3*  HCT 31.1*  MCV 93.1  PLT 226   Cardiac Enzymes: No results for input(s): CKTOTAL, CKMB, CKMBINDEX, TROPONINI in the last 168 hours. BNP: Invalid input(s): POCBNP CBG: Recent Labs  Lab 11/13/18 1059 11/13/18 1605 11/13/18 2139 11/14/18 0743 11/14/18 1053  GLUCAP 158* 160* 156* 116* 165*   HbA1C: No results for input(s): HGBA1C in the last 72 hours. Urine analysis:  Recent Results (from the past 240 hour(s))  SARS CORONAVIRUS 2 (TAT 6-24 HRS) Nasopharyngeal Nasopharyngeal Swab     Status: None   Collection Time: 11/09/18  1:23 PM    Specimen: Nasopharyngeal Swab  Result Value Ref Range Status   SARS Coronavirus 2 NEGATIVE NEGATIVE Final    Comment: (NOTE) SARS-CoV-2 target nucleic acids are NOT DETECTED. The SARS-CoV-2 RNA is generally detectable in upper and lower respiratory specimens during the acute phase of infection. Negative results do not preclude SARS-CoV-2 infection, do not rule out co-infections with other pathogens, and should not be used as the sole basis for treatment or other patient management decisions. Negative results must be combined with clinical observations, patient history, and epidemiological information. The expected result is Negative. Fact Sheet for Patients: SugarRoll.be Fact Sheet for Healthcare Providers: https://www.woods-mathews.com/ This test is not yet approved or cleared by the [place] FDA and  has been authorized for detection and/or diagnosis of SARS-CoV-2 by FDA under an Emergency Use Authorization (EUA). This EUA will remain  in effect (meaning this test can be used) for the duration of the COVID-19 declaration under Section 56 4(b)(1) of the Act, 21 U.S.C. section 360bbb-3(b)(1), unless the authorization is terminated or revoked sooner. Performed at  [facility], [place] [address], [place], [place] [address]     Scheduled Meds: . apixaban  2.5 mg Oral BID  . aspirin EC  81 mg Oral Daily  . atorvastatin  40 mg Oral Daily  . dorzolamide-timolol  1 drop Both Eyes BID  . doxazosin  2 mg Oral QHS  . famotidine  20 mg Oral Daily  . finasteride  5 mg Oral QPM  . [START ON 11/15/2018] furosemide  40 mg Intravenous Daily  . insulin aspart  0-9 Units Subcutaneous TID WC  . levothyroxine  50 mcg Oral QAC breakfast  . sodium chloride flush  3 mL Intravenous Q12H  . tamsulosin  0.4 mg Oral BID  . vitamin C  500 mg Oral Daily  . vitamin E  400 Units Oral Daily   Continuous Infusions: . sodium chloride      Procedures/Studies: Dg Chest Port 1 View  Result Date: 11/13/2018 CLINICAL DATA:  Pleural effusion EXAM: PORTABLE CHEST 1 VIEW COMPARISON:  Under 4 days ago FINDINGS: Normal heart size. Congested appearance of the hila. Hazy appearance of the lower chest from layering pleural effusions, likely improved. There is also vascular congestion. IMPRESSION: Layering pleural effusions and vascular congestion. Aeration has likely improved from 4 days ago. Electronically Signed   By: Monte Fantasia M.D.   On: 11/13/2018 05:02   Dg Chest Port 1 View  Result Date: 11/09/2018 CLINICAL DATA:  Shortness of breath EXAM: PORTABLE CHEST 1 VIEW COMPARISON:  10/15/2017 FINDINGS: Cardiac silhouette is largely obscured. Lung volumes are low. There are patchy bibasilar airspace opacities. Probable small bilateral pleural effusions. No pneumothorax. Degenerative changes of the bilateral shoulders and spine. IMPRESSION: Low lung volumes with patchy bibasilar airspace opacities and small bilateral pleural effusions. Findings may reflect atelectasis and/or pneumonia. Electronically Signed   By: Davina Poke M.D.   On: 11/09/2018 12:12   Irwin Brakeman, MD [facility] How to contact the [facility] Attending or Consulting provider [place] or covering provider during after hours [place], for this patient?  1. Check the care team in [facility] and look for a) attending/consulting [facility] provider listed and b) the [facility] team listed 2. Log into [email or web address] and use [facility]'s universal password to access. If you do not have the password, please contact the hospital operator. 3. Locate the [facility] provider you are looking for under [facility] and page to a number that you can be directly reached. 4. If you still have difficulty reaching the provider, please page the [facility] (Director on Call) for the Hospitalists listed on amion for assistance.   If 7PM-7AM, please contact night-coverage [email or web address] Password [email or web address] 11/14/2018,  4:05 PM   LOS: 5 days

## 2018-11-15 LAB — GLUCOSE, CAPILLARY
Glucose-Capillary: 109 mg/dL — ABNORMAL HIGH (ref 70–99)
Glucose-Capillary: 172 mg/dL — ABNORMAL HIGH (ref 70–99)
Glucose-Capillary: 125 mg/dL — ABNORMAL HIGH (ref 70–99)
Glucose-Capillary: 153 mg/dL — ABNORMAL HIGH (ref 70–99)

## 2018-11-15 LAB — BASIC METABOLIC PANEL
Anion gap: 9 (ref 5–15)
BUN: 35 mg/dL — ABNORMAL HIGH (ref 8–23)
CO2: 29 mmol/L (ref 22–32)
Calcium: 8.6 mg/dL — ABNORMAL LOW (ref 8.9–10.3)
Chloride: 95 mmol/L — ABNORMAL LOW (ref 98–111)
Creatinine, Ser: 1.33 mg/dL — ABNORMAL HIGH (ref 0.61–1.24)
GFR calc Af Amer: 55 mL/min — ABNORMAL LOW (ref >60)
GFR calc non Af Amer: 47 mL/min — ABNORMAL LOW (ref >60)
Glucose, Bld: 123 mg/dL — ABNORMAL HIGH (ref 70–99)
Potassium: 3.5 mmol/L (ref 3.5–5.1)
Sodium: 133 mmol/L — ABNORMAL LOW (ref 135–145)

## 2018-11-15 NOTE — Progress Notes (Addendum)
PROGRESS NOTE  [name] VWP:[id number] DOB: 1927/12/30 DOA: 11/09/2018 PCP: Redmond School, MD  Brief History:  85 y.o. male with medical history of diabetes mellitus type 2, diastolic CHF, CKD stage III, hypertension, right BKA, paroxysmal atrial fibrillation, peripheral arterial disease presenting with increasing lower extremity edema extending to his groin, scrotum, and lower abdomen.  The patient normally has lower extreme edema, but the son states that it has significantly worsened in the past week.  Patient has had some intermittent shortness of breath, but states that this is not any worse than usual.  He denies any fever, chills, chest pain, coughing, hemoptysis, nausea vomiting, diarrhea, abdominal pain, dysuria, hematuria.  According to the patient's son, the patient is supposed to be taking furosemide 40 mg twice daily, but he only is able to take it twice a day for 3 to 4 days out of the usual week because of his numerous doctors appointments and other commitments.  Apparently, his furosemide was recently increased to 40 mg 3 times daily, but the patient has not been taking it this frequently as discussed above.  Son states that they eat out approximately 2 times per week.  There is no flow restriction.  The patient does not weigh himself.  He went to see his primary care provider on 10/26.  Because of concerns of worsening edema and anasarca, the patient was sent to emergency department for further evaluation. In the emergency department, the patient was afebrile hemodynamically stable with oxygen saturation 96-98% on room air.  BMP showed a potassium 3.2, sodium 130, normal LFTs peer WBC was 10.4 with hemoglobin 10.3, platelets 226,000.  Chest x-ray showed increased interstitial markings, low volumes, small bilateral pleural effusions.  The patient was started IV furosemide.  Assessment/Plan: Acute on chronic diastolic CHF -Patient presents with decompensation and anasarca  -Continue furosemide 40 mg IV daily -Daily weights -08/27/2018 echo EF 55 to 60%, impaired relaxation, moderate aortic stenosis  Intake/Output Summary (Last 24 hours) at 11/15/2018 1025 Last data filed at 11/15/2018 1014 Gross per 24 hour  Intake 480 ml  Output 3151 ml  Net -2671 ml   Filed Weights   11/12/18 0523 11/14/18 0509 11/15/18 0441  Weight: 109.1 kg 105.6 kg 104.8 kg    Peripheral arterial disease -Patient had right BKA secondary to critical limb ischemia 08/28/2018 -Case was discussed with Dr. [name] who has been following the patient's peripheral arterial disease--> no urgent intervention necessary at this time unless the patient's condition changes/worsens -Continue apixaban and aspirin, follow up with Dr. Gwenlyn Found outpatient.    Paroxysmal atrial fibrillation -Rate controlled -Continue apixaban   CKD stage IIIa -Baseline creatinine 1.01.3 -Monitor with diuresis, currently holding stable   Diabetes mellitus type 2, controlled -Holding glipizide -11/09/18 hemoglobin A1c--5.9 -05/29/2018 hemoglobin A1c 7.0 -NovoLog sliding scale   CBG (last 3)  Recent Labs    11/14/18 1658 11/14/18 2101 11/15/18 0733  GLUCAP 148* 154* 109*   Hypothyroidism -Continue Synthroid   Hyperlipidemia -Continue statin   Hypokalemia -Repleted -Check magnesium   Disposition Plan:    Family Communication:   Son updated at bedside 10/30  Consultants:  none  Code Status:  DNR  DVT Prophylaxis: apixaban  Procedures: As Listed in Progress Note Above  Antibiotics: None  Subjective: Patient without complaints, wants to get up in chair today.     Objective: Vitals:   11/14/18 1330 11/14/18 2100 11/15/18 0441 11/15/18 0447  BP: (!) 143/61 [name])  129/53 (!) 141/58   Pulse: 63 61 (!) 56 60  Resp:    18  Temp: 98 F (36.7 C) 98.8 F (37.1 C)  98 F (36.7 C)  TempSrc: Oral   Oral  SpO2: 96% 93% 94% 94%  Weight:   104.8 kg   Height:        Intake/Output Summary  (Last 24 hours) at 11/15/2018 1022 Last data filed at 11/15/2018 1014 Gross per 24 hour  Intake 480 ml  Output 3151 ml  Net -2671 ml   Weight change: -0.8 kg Exam:  General:  Pt is alert, follows commands appropriately, not in acute distress HEENT: No icterus, No thrush, No neck mass, [place]/AT Cardiovascular: normal S1/S2, no rubs, no gallops Respiratory: no crackles, no wheezes Abdomen: Soft/+BS, non tender, non distended, no guarding GU: scrotal edema 1+ (improving from prior exams) Extremities: trace LLE edema, No lymphangitis, No petechiae, No rashes, no synovitis, left foot red, nontender, warm to touch, left 1st toe unchanged.   Data Reviewed: I have personally reviewed following labs and imaging studies Basic Metabolic Panel: Recent Labs  Lab 11/11/18 0624 11/12/18 0654 11/13/18 0649 11/14/18 0639 11/15/18 0627  NA 132* 132* 132* 133* 133*  K 3.5 4.2 3.9 3.7 3.5  CL 95* 95* 95* 93* 95*  CO2 27 26 26 29 29   GLUCOSE 138* 131* 120* 127* 123*  BUN 32* 33* 33* 37* 35*  CREATININE 1.36* 1.38* 1.29* 1.40* 1.33*  CALCIUM 8.5* 8.5* 8.6* 8.7* 8.6*  MG 2.0  --   --   --   --    Liver Function Tests: Recent Labs  Lab 11/09/18 1209  AST 32  ALT 21  ALKPHOS 39  BILITOT 1.0  PROT 6.7  ALBUMIN 3.2*   No results for input(s): LIPASE, AMYLASE in the last 168 hours. No results for input(s): AMMONIA in the last 168 hours. Coagulation Profile: No results for input(s): INR, PROTIME in the last 168 hours. CBC: Recent Labs  Lab 11/09/18 1209  WBC 10.4  NEUTROABS 6.4  HGB 10.3*  HCT 31.1*  MCV 93.1  PLT 226   Cardiac Enzymes: No results for input(s): CKTOTAL, CKMB, CKMBINDEX, TROPONINI in the last 168 hours. BNP: Invalid input(s): POCBNP CBG: Recent Labs  Lab 11/14/18 0743 11/14/18 1053 11/14/18 1658 11/14/18 2101 11/15/18 0733  GLUCAP 116* 165* 148* 154* 109*   HbA1C: No results for input(s): HGBA1C in the last 72 hours. Urine analysis:  Recent Results  (from the past 240 hour(s))  SARS CORONAVIRUS 2 (TAT 6-24 HRS) Nasopharyngeal Nasopharyngeal Swab     Status: None   Collection Time: 11/09/18  1:23 PM   Specimen: Nasopharyngeal Swab  Result Value Ref Range Status   SARS Coronavirus 2 NEGATIVE NEGATIVE Final    Comment: (NOTE) SARS-CoV-2 target nucleic acids are NOT DETECTED. The SARS-CoV-2 RNA is generally detectable in upper and lower respiratory specimens during the acute phase of infection. Negative results do not preclude SARS-CoV-2 infection, do not rule out co-infections with other pathogens, and should not be used as the sole basis for treatment or other patient management decisions. Negative results must be combined with clinical observations, patient history, and epidemiological information. The expected result is Negative. Fact Sheet for Patients: SugarRoll.be Fact Sheet for Healthcare Providers: https://www.woods-mathews.com/ This test is not yet approved or cleared by the [place] FDA and  has been authorized for detection and/or diagnosis of SARS-CoV-2 by FDA under an Emergency Use Authorization (EUA). This EUA will remain  in  effect (meaning this test can be used) for the duration of the COVID-19 declaration under Section 56 4(b)(1) of the Act, 21 U.S.C. section 360bbb-3(b)(1), unless the authorization is terminated or revoked sooner. Performed at [facility], [place] [address], [place], [place] [address]     Scheduled Meds:  apixaban  2.5 mg Oral BID   aspirin EC  81 mg Oral Daily   atorvastatin  40 mg Oral Daily   dorzolamide-timolol  1 drop Both Eyes BID   doxazosin  2 mg Oral QHS   famotidine  20 mg Oral Daily   finasteride  5 mg Oral QPM   furosemide  40 mg Intravenous Daily   insulin aspart  0-9 Units Subcutaneous TID WC   levothyroxine  50 mcg Oral QAC breakfast   sodium chloride flush  3 mL Intravenous Q12H   tamsulosin  0.4 mg Oral BID   vitamin C   500 mg Oral Daily   vitamin E  400 Units Oral Daily   Continuous Infusions:  sodium chloride     Procedures/Studies: Dg Chest Port 1 View  Result Date: 11/13/2018 CLINICAL DATA:  Pleural effusion EXAM: PORTABLE CHEST 1 VIEW COMPARISON:  Under 4 days ago FINDINGS: Normal heart size. Congested appearance of the hila. Hazy appearance of the lower chest from layering pleural effusions, likely improved. There is also vascular congestion. IMPRESSION: Layering pleural effusions and vascular congestion. Aeration has likely improved from 4 days ago. Electronically Signed   By: Monte Fantasia M.D.   On: 11/13/2018 05:02   Dg Chest Port 1 View  Result Date: 11/09/2018 CLINICAL DATA:  Shortness of breath EXAM: PORTABLE CHEST 1 VIEW COMPARISON:  10/15/2017 FINDINGS: Cardiac silhouette is largely obscured. Lung volumes are low. There are patchy bibasilar airspace opacities. Probable small bilateral pleural effusions. No pneumothorax. Degenerative changes of the bilateral shoulders and spine. IMPRESSION: Low lung volumes with patchy bibasilar airspace opacities and small bilateral pleural effusions. Findings may reflect atelectasis and/or pneumonia. Electronically Signed   By: Davina Poke M.D.   On: 11/09/2018 12:12   Irwin Brakeman, MD [facility] How to contact the [facility] Attending or Consulting provider [place] or covering provider during after hours [place], for this patient?  Check the care team in [facility] and look for a) attending/consulting TRH provider listed and b) the [facility] team listed Log into [email or web address] and use [place]'s universal password to access. If you do not have the password, please contact the hospital operator. Locate the [facility] provider you are looking for under [facility] and page to a number that you can be directly reached. If you still have difficulty reaching the provider, please page the [facility] (Director on Call) for the Hospitalists listed on amion for assistance.    If 7PM-7AM, please contact night-coverage [email or web address] Password [email or web address] 11/15/2018, 10:22 AM   LOS: 6 days

## 2018-11-15 NOTE — Progress Notes (Signed)
Patient's daughter brought his home dosage of Santyl ointment ordered by his wound care MD. Patient's daughter has requested medication to be placed on the wound bed of patient's left foot. MD has been notified and received verbal order to place the Santyl ointment on patient's left foot.

## 2018-11-16 LAB — GLUCOSE, CAPILLARY
Glucose-Capillary: 127 mg/dL — ABNORMAL HIGH (ref 70–99)
Glucose-Capillary: 138 mg/dL — ABNORMAL HIGH (ref 70–99)

## 2018-11-16 LAB — BASIC METABOLIC PANEL
Anion gap: 11 (ref 5–15)
BUN: 35 mg/dL — ABNORMAL HIGH (ref 8–23)
CO2: 27 mmol/L (ref 22–32)
Calcium: 8.4 mg/dL — ABNORMAL LOW (ref 8.9–10.3)
Chloride: 93 mmol/L — ABNORMAL LOW (ref 98–111)
Creatinine, Ser: 1.45 mg/dL — ABNORMAL HIGH (ref 0.61–1.24)
GFR calc Af Amer: 49 mL/min — ABNORMAL LOW (ref >60)
GFR calc non Af Amer: 42 mL/min — ABNORMAL LOW (ref >60)
Glucose, Bld: 129 mg/dL — ABNORMAL HIGH (ref 70–99)
Potassium: 3.6 mmol/L (ref 3.5–5.1)
Sodium: 131 mmol/L — ABNORMAL LOW (ref 135–145)

## 2018-11-16 LAB — CBC
HCT: 30.4 % — ABNORMAL LOW (ref 39.0–52.0)
Hemoglobin: 9.8 g/dL — ABNORMAL LOW (ref 13.0–17.0)
MCH: 30.2 pg (ref 26.0–34.0)
MCHC: 32.2 g/dL (ref 30.0–36.0)
MCV: 93.8 fL (ref 80.0–100.0)
Platelets: 256 10*3/uL (ref 150–400)
RBC: 3.24 MIL/uL — ABNORMAL LOW (ref 4.22–5.81)
RDW: 14.6 % (ref 11.5–15.5)
WBC: 9.2 10*3/uL (ref 4.0–10.5)
nRBC: 0 % (ref 0.0–0.2)

## 2018-11-16 MED ORDER — FUROSEMIDE 40 MG PO TABS: 40.0000 mg | ORAL_TABLET | Freq: Two times a day (BID) | ORAL | 1 refills | Status: DC

## 2018-11-16 NOTE — TOC Transition Note (Signed)
Transition of Care [facility]) - CM/SW Discharge Note   Patient Details  Name: [name] MRN: [id number] Date of Birth: [birth year]  Transition of [facility] CM/SW Contact:  Boneta Lucks, RN Phone Number: 11/16/2018, 1:24 PM   Clinical Narrative:   Patient discharging home. Resumption orders done, adding PT. Linda with [facility] Updated.    Final next level of care: [place] Barriers to Discharge: Barriers Resolved   Patient Goals and CMS Choice Patient states their goals for this hospitalization and ongoing recovery are:: to go home with family.       HH Arranged: PT HH Agency: [facility] ([place]) Date [place]: 11/12/18 Time [place]: 1100 Representative spoke with at [place]: [place]    Readmission Risk Interventions Readmission Risk Prevention Plan 11/12/2018 11/10/2018 08/28/2018  Transportation Screening - Complete Complete  PCP or Specialist Appt within 3-5 Days - - Not Complete  Not Complete comments - - plan for SNF  HRI or [place] - - Not Complete  HRI or Home Care Consult comments - - plan for SNF  Social Work Consult for [place] Planning/Counseling - - Complete  Palliative Care Screening - - Complete  Medication Review Press photographer) - Complete Complete  PCP or Specialist appointment within 3-5 days of discharge Complete Not Complete -  HRI or [place] - Complete -  SW Recovery Care/Counseling Consult - Complete -  Palliative Care Screening - Not Complete -  [place] Not Applicable Not Complete -  Some recent data might be hidden

## 2018-11-16 NOTE — Care Management Important Message (Signed)
Important Message  Patient Details  Name: [name] MRN: [id number] Date of Birth: 10-26-1927   Medicare Important Message Given:  Yes     [name] 11/16/2018, 1:13 PM

## 2018-11-16 NOTE — Plan of Care (Signed)

## 2018-11-16 NOTE — Progress Notes (Signed)
11/16/2018 12:51 PM  Pt feeling much better today and requests to discharge home with son.  I spoke with son at bedside and he says he has everything needed at home to care for him.  He will make sure patient goes to his follow up appointments with wound care and cardiology and vascular surgery.   Please see updated discharge summary.    Murvin Natal MD How to contact the [facility] Attending or Consulting provider [place] or covering provider during after hours [place], for this patient?  1. Check the care team in [facility] and look for a) attending/consulting TRH provider listed and b) the [facility] team listed 2. Log into [email or web address] and use [place]'s universal password to access. If you do not have the password, please contact the hospital operator. 3. Locate the [facility] provider you are looking for under [facility] and page to a number that you can be directly reached. 4. If you still have difficulty reaching the provider, please page the [facility] (Director on Call) for the Hospitalists listed on amion for assistance.

## 2018-11-18 ENCOUNTER — Other Ambulatory Visit: Payer: Self-pay

## 2018-11-18 ENCOUNTER — Emergency Department (HOSPITAL_COMMUNITY): Payer: Medicare Other

## 2018-11-18 ENCOUNTER — Encounter (HOSPITAL_COMMUNITY): Payer: Self-pay

## 2018-11-18 ENCOUNTER — Encounter (HOSPITAL_BASED_OUTPATIENT_CLINIC_OR_DEPARTMENT_OTHER): Payer: Medicare Other | Attending: Physician Assistant | Admitting: Physician Assistant

## 2018-11-18 ENCOUNTER — Inpatient Hospital Stay (HOSPITAL_COMMUNITY)
Admission: EM | Admit: 2018-11-18 | Discharge: 2018-12-09 | DRG: 270 | Disposition: A | Discharge status: Rehabilitation Facility | Payer: Medicare Other | Attending: Student | Admitting: Student

## 2018-11-18 DIAGNOSIS — H9190 Unspecified hearing loss, unspecified ear: Secondary | ICD-10-CM | POA: Diagnosis present

## 2018-11-18 DIAGNOSIS — E11621 Type 2 diabetes mellitus with foot ulcer: Secondary | ICD-10-CM | POA: Diagnosis not present

## 2018-11-18 DIAGNOSIS — E538 Deficiency of other specified B group vitamins: Secondary | ICD-10-CM | POA: Diagnosis not present

## 2018-11-18 DIAGNOSIS — Z79890 Hormone replacement therapy (postmenopausal): Secondary | ICD-10-CM

## 2018-11-18 DIAGNOSIS — Z79899 Other long term (current) drug therapy: Secondary | ICD-10-CM

## 2018-11-18 DIAGNOSIS — Z66 Do not resuscitate: Secondary | ICD-10-CM | POA: Diagnosis not present

## 2018-11-18 DIAGNOSIS — L089 Local infection of the skin and subcutaneous tissue, unspecified: Secondary | ICD-10-CM | POA: Diagnosis not present

## 2018-11-18 DIAGNOSIS — E1152 Type 2 diabetes mellitus with diabetic peripheral angiopathy with gangrene: Secondary | ICD-10-CM | POA: Diagnosis not present

## 2018-11-18 DIAGNOSIS — Z03818 Encounter for observation for suspected exposure to other biological agents ruled out: Secondary | ICD-10-CM | POA: Diagnosis not present

## 2018-11-18 DIAGNOSIS — I130 Hypertensive heart and chronic kidney disease with heart failure and stage 1 through stage 4 chronic kidney disease, or unspecified chronic kidney disease: Secondary | ICD-10-CM | POA: Diagnosis not present

## 2018-11-18 DIAGNOSIS — Z959 Presence of cardiac and vascular implant and graft, unspecified: Secondary | ICD-10-CM | POA: Diagnosis not present

## 2018-11-18 DIAGNOSIS — I440 Atrioventricular block, first degree: Secondary | ICD-10-CM | POA: Diagnosis present

## 2018-11-18 DIAGNOSIS — I480 Paroxysmal atrial fibrillation: Secondary | ICD-10-CM | POA: Diagnosis present

## 2018-11-18 DIAGNOSIS — I70212 Atherosclerosis of native arteries of extremities with intermittent claudication, left leg: Secondary | ICD-10-CM | POA: Diagnosis not present

## 2018-11-18 DIAGNOSIS — Z8249 Family history of ischemic heart disease and other diseases of the circulatory system: Secondary | ICD-10-CM

## 2018-11-18 DIAGNOSIS — L97529 Non-pressure chronic ulcer of other part of left foot with unspecified severity: Secondary | ICD-10-CM | POA: Diagnosis not present

## 2018-11-18 DIAGNOSIS — J90 Pleural effusion, not elsewhere classified: Secondary | ICD-10-CM | POA: Diagnosis not present

## 2018-11-18 DIAGNOSIS — D509 Iron deficiency anemia, unspecified: Secondary | ICD-10-CM | POA: Diagnosis present

## 2018-11-18 DIAGNOSIS — N1832 Chronic kidney disease, stage 3b: Secondary | ICD-10-CM | POA: Diagnosis not present

## 2018-11-18 DIAGNOSIS — I70203 Unspecified atherosclerosis of native arteries of extremities, bilateral legs: Secondary | ICD-10-CM | POA: Diagnosis present

## 2018-11-18 DIAGNOSIS — N1831 Chronic kidney disease, stage 3a: Secondary | ICD-10-CM | POA: Diagnosis not present

## 2018-11-18 DIAGNOSIS — Z7982 Long term (current) use of aspirin: Secondary | ICD-10-CM

## 2018-11-18 DIAGNOSIS — D72829 Elevated white blood cell count, unspecified: Secondary | ICD-10-CM

## 2018-11-18 DIAGNOSIS — I739 Peripheral vascular disease, unspecified: Secondary | ICD-10-CM

## 2018-11-18 DIAGNOSIS — D638 Anemia in other chronic diseases classified elsewhere: Secondary | ICD-10-CM | POA: Diagnosis present

## 2018-11-18 DIAGNOSIS — N179 Acute kidney failure, unspecified: Secondary | ICD-10-CM | POA: Diagnosis not present

## 2018-11-18 DIAGNOSIS — Z87891 Personal history of nicotine dependence: Secondary | ICD-10-CM

## 2018-11-18 DIAGNOSIS — I70213 Atherosclerosis of native arteries of extremities with intermittent claudication, bilateral legs: Secondary | ICD-10-CM | POA: Diagnosis not present

## 2018-11-18 DIAGNOSIS — I5032 Chronic diastolic (congestive) heart failure: Secondary | ICD-10-CM | POA: Diagnosis not present

## 2018-11-18 DIAGNOSIS — L03116 Cellulitis of left lower limb: Secondary | ICD-10-CM | POA: Diagnosis not present

## 2018-11-18 DIAGNOSIS — L97509 Non-pressure chronic ulcer of other part of unspecified foot with unspecified severity: Secondary | ICD-10-CM | POA: Diagnosis present

## 2018-11-18 DIAGNOSIS — Z9889 Other specified postprocedural states: Secondary | ICD-10-CM

## 2018-11-18 DIAGNOSIS — I998 Other disorder of circulatory system: Secondary | ICD-10-CM | POA: Diagnosis not present

## 2018-11-18 DIAGNOSIS — L89623 Pressure ulcer of left heel, stage 3: Secondary | ICD-10-CM | POA: Diagnosis not present

## 2018-11-18 DIAGNOSIS — Z20828 Contact with and (suspected) exposure to other viral communicable diseases: Secondary | ICD-10-CM | POA: Diagnosis present

## 2018-11-18 DIAGNOSIS — Z89511 Acquired absence of right leg below knee: Secondary | ICD-10-CM

## 2018-11-18 DIAGNOSIS — E039 Hypothyroidism, unspecified: Secondary | ICD-10-CM | POA: Diagnosis present

## 2018-11-18 DIAGNOSIS — I96 Gangrene, not elsewhere classified: Secondary | ICD-10-CM | POA: Diagnosis not present

## 2018-11-18 DIAGNOSIS — I10 Essential (primary) hypertension: Secondary | ICD-10-CM | POA: Diagnosis present

## 2018-11-18 DIAGNOSIS — Z7901 Long term (current) use of anticoagulants: Secondary | ICD-10-CM

## 2018-11-18 DIAGNOSIS — E785 Hyperlipidemia, unspecified: Secondary | ICD-10-CM | POA: Diagnosis present

## 2018-11-18 DIAGNOSIS — E782 Mixed hyperlipidemia: Secondary | ICD-10-CM | POA: Diagnosis not present

## 2018-11-18 DIAGNOSIS — E1122 Type 2 diabetes mellitus with diabetic chronic kidney disease: Secondary | ICD-10-CM | POA: Diagnosis present

## 2018-11-18 DIAGNOSIS — M7989 Other specified soft tissue disorders: Secondary | ICD-10-CM | POA: Diagnosis not present

## 2018-11-18 DIAGNOSIS — D649 Anemia, unspecified: Secondary | ICD-10-CM | POA: Diagnosis present

## 2018-11-18 DIAGNOSIS — N400 Enlarged prostate without lower urinary tract symptoms: Secondary | ICD-10-CM | POA: Diagnosis present

## 2018-11-18 DIAGNOSIS — H409 Unspecified glaucoma: Secondary | ICD-10-CM | POA: Diagnosis not present

## 2018-11-18 DIAGNOSIS — I350 Nonrheumatic aortic (valve) stenosis: Secondary | ICD-10-CM

## 2018-11-18 DIAGNOSIS — I70229 Atherosclerosis of native arteries of extremities with rest pain, unspecified extremity: Secondary | ICD-10-CM

## 2018-11-18 DIAGNOSIS — L97329 Non-pressure chronic ulcer of left ankle with unspecified severity: Secondary | ICD-10-CM | POA: Diagnosis not present

## 2018-11-18 DIAGNOSIS — Z823 Family history of stroke: Secondary | ICD-10-CM

## 2018-11-18 HISTORY — DX: Type 2 diabetes mellitus without complications: E11.9

## 2018-11-18 LAB — CBC WITH DIFFERENTIAL/PLATELET
Abs Immature Granulocytes: 0.03 10*3/uL (ref 0.00–0.07)
Basophils Absolute: 0.1 10*3/uL (ref 0.0–0.1)
Basophils Relative: 1 %
Eosinophils Absolute: 0.7 10*3/uL — ABNORMAL HIGH (ref 0.0–0.5)
Eosinophils Relative: 6 %
HCT: 34.9 % — ABNORMAL LOW (ref 39.0–52.0)
Hemoglobin: 11.5 g/dL — ABNORMAL LOW (ref 13.0–17.0)
Immature Granulocytes: 0 %
Lymphocytes Relative: 33 %
Lymphs Abs: 3.6 10*3/uL (ref 0.7–4.0)
MCH: 30.5 pg (ref 26.0–34.0)
MCHC: 33 g/dL (ref 30.0–36.0)
MCV: 92.6 fL (ref 80.0–100.0)
Monocytes Absolute: 0.9 10*3/uL (ref 0.1–1.0)
Monocytes Relative: 9 %
Neutro Abs: 5.6 10*3/uL (ref 1.7–7.7)
Neutrophils Relative %: 51 %
Platelets: 310 10*3/uL (ref 150–400)
RBC: 3.77 MIL/uL — ABNORMAL LOW (ref 4.22–5.81)
RDW: 14.4 % (ref 11.5–15.5)
WBC: 10.8 10*3/uL — ABNORMAL HIGH (ref 4.0–10.5)
nRBC: 0 % (ref 0.0–0.2)

## 2018-11-18 LAB — COMPREHENSIVE METABOLIC PANEL
ALT: 25 U/L (ref 0–44)
AST: 33 U/L (ref 15–41)
Albumin: 3.3 g/dL — ABNORMAL LOW (ref 3.5–5.0)
Alkaline Phosphatase: 47 U/L (ref 38–126)
Anion gap: 13 (ref 5–15)
BUN: 31 mg/dL — ABNORMAL HIGH (ref 8–23)
CO2: 23 mmol/L (ref 22–32)
Calcium: 8.8 mg/dL — ABNORMAL LOW (ref 8.9–10.3)
Chloride: 96 mmol/L — ABNORMAL LOW (ref 98–111)
Creatinine, Ser: 1.27 mg/dL — ABNORMAL HIGH (ref 0.61–1.24)
GFR calc Af Amer: 58 mL/min — ABNORMAL LOW (ref >60)
GFR calc non Af Amer: 50 mL/min — ABNORMAL LOW (ref >60)
Glucose, Bld: 111 mg/dL — ABNORMAL HIGH (ref 70–99)
Potassium: 3.8 mmol/L (ref 3.5–5.1)
Sodium: 132 mmol/L — ABNORMAL LOW (ref 135–145)
Total Bilirubin: 0.7 mg/dL (ref 0.3–1.2)
Total Protein: 7.1 g/dL (ref 6.5–8.1)

## 2018-11-18 LAB — LACTIC ACID, PLASMA: Lactic Acid, Venous: 1.1 mmol/L (ref 0.5–1.9)

## 2018-11-18 LAB — C-REACTIVE PROTEIN: CRP: 5.1 mg/dL — ABNORMAL HIGH (ref <1.0)

## 2018-11-18 LAB — URINALYSIS, ROUTINE W REFLEX MICROSCOPIC
Bilirubin Urine: NEGATIVE
Glucose, UA: NEGATIVE mg/dL
Hgb urine dipstick: NEGATIVE
Ketones, ur: NEGATIVE mg/dL
Nitrite: NEGATIVE
Protein, ur: NEGATIVE mg/dL
Specific Gravity, Urine: 1.008 (ref 1.005–1.030)
pH: 8 (ref 5.0–8.0)

## 2018-11-18 LAB — SEDIMENTATION RATE: Sed Rate: 84 mm/hr — ABNORMAL HIGH (ref 0–16)

## 2018-11-18 MED ORDER — SODIUM CHLORIDE 0.9% FLUSH
3.0000 mL | Freq: Once | INTRAVENOUS | Status: AC
  Administered 2018-11-18: 3 mL via INTRAVENOUS

## 2018-11-18 MED ORDER — VANCOMYCIN HCL 10 G IV SOLR
2000.0000 mg | Freq: Once | INTRAVENOUS | Status: AC
  Administered 2018-11-18: 2000 mg via INTRAVENOUS
  Filled 2018-11-18: qty 2000

## 2018-11-18 MED ORDER — PIPERACILLIN-TAZOBACTAM 3.375 G IVPB 30 MIN
3.3750 g | Freq: Once | INTRAVENOUS | Status: AC
  Administered 2018-11-18: 3.375 g via INTRAVENOUS
  Filled 2018-11-18: qty 50

## 2018-11-18 NOTE — Progress Notes (Addendum)
A consult was received from an ED physician for Vancomycin and Zosyn per pharmacy dosing.  The patient's profile has been reviewed for ht/wt/allergies/indication/available labs.   A one time order has been placed for Vancomycin 2gm iv x1, and Zosyn 3.375gm iv x1.  Further antibiotics/pharmacy consults should be ordered by admitting physician if indicated.                       Thank you, Nani Skillern Crowford 11/18/2018  10:19 PM

## 2018-11-18 NOTE — ED Provider Notes (Addendum)
[facility] DEPT Provider Note   CSN: [id number] Arrival date & time: 11/18/18  1606     History   Chief Complaint Chief Complaint  Patient presents with  . Toe Pain  . Wound Infection    HPI [name] is a 85 y.o. male.  Presents to ER with left toe great toe infection.  History obtained from patient, chart review, nursing report.  Patient has extensive history of peripheral vascular disease, diabetes.  Recent right BKA.  Patient has had wound over his left great toe, but over the past couple days has had significant increase in swelling, redness, now black and necrotic appearing.  Patient denies fever.  No difficulty breathing, vomiting.     HPI  Past Medical History:  Diagnosis Date  . Arthritis   . Borderline diabetic   . Glaucoma   . Hyperlipidemia   . Hypertension   . PVD (peripheral vascular disease) ([place])   . Sleep apnea    Cpap ordered but doesnt use    Patient Active Problem List   Diagnosis Date Noted  . Peripheral edema   . Acute on chronic diastolic CHF (congestive heart failure) ([place]) 11/09/2018  . S/P BKA (below knee amputation) unilateral, right ([place]) 10/05/2018  . Hyponatremia   . Essential hypertension   . Postoperative pain   . Pain of left heel   . Unable to maintain weight-bearing   . Type 2 diabetes mellitus with diabetic peripheral angiopathy with gangrene ([place])   . Anemia due to acute blood loss   . Stage 3 chronic kidney disease   . Acquired absence of right leg below knee ([place]) 09/07/2018  . Gangrene of right foot ([place])   . DNR (do not resuscitate) discussion   . Goals of care, counseling/discussion   . Palliative care by specialist   . Type 2 diabetes mellitus with foot ulcer and gangrene ([place])   . Severe protein-calorie malnutrition ([place])   . Ischemic foot 08/20/2018  . Critical lower limb ischemia 08/03/2018  . Type 2 diabetes mellitus with foot ulcer, without long-term current use of insulin  ([place])   . Gastroesophageal reflux disease   . Cellulitis of great toe of right foot 05/29/2018  . Atrial fibrillation, chronic   . Chest pain 07/25/2017  . PAF (paroxysmal atrial fibrillation) ([place]) 07/25/2017  . BPH (benign prostatic hyperplasia) 07/25/2017  . Shortness of breath 05/11/2017  . Hypothyroidism 05/11/2017  . Chronic diastolic CHF (congestive heart failure) ([place]) 05/11/2017  . Aortic valve stenosis 06/12/2016  . RBBB 06/12/2016  . PAD (peripheral artery disease) ([place]) 08/04/2012  . Essential hypertension, benign 08/04/2012  . Hyperlipidemia 08/04/2012  . Dizziness 08/04/2012    Past Surgical History:  Procedure Laterality Date  . ABDOMINAL AORTOGRAM W/LOWER EXTREMITY N/A 07/27/2018   Procedure: ABDOMINAL AORTOGRAM W/LOWER EXTREMITY;  Surgeon: Lorretta Harp, MD;  Location: [facility];  Service: Cardiovascular;  Laterality: N/A;  . AMPUTATION Right 08/28/2018   Procedure: RIGHT BELOW KNEE AMPUTATION;  Surgeon: [name], MD;  Location: [facility];  Service: Orthopedics;  Laterality: Right;  . APPENDECTOMY  1943  . CATARACT EXTRACTION  2012   x2  . COLONOSCOPY N/A 11/01/2014   Procedure: COLONOSCOPY;  Surgeon: Aviva Signs, MD;  [facility];  Service: Gastroenterology;  Laterality: N/A;  . ESOPHAGOGASTRODUODENOSCOPY N/A 11/01/2014   Procedure: ESOPHAGOGASTRODUODENOSCOPY (EGD);  Surgeon: Aviva Signs, MD;  [facility];  Service: Gastroenterology;  Laterality: N/A;  . HERNIA REPAIR  Left   . INGUINAL HERNIA REPAIR Right 03/01/2013   Procedure: RIGHT INGUINAL HERNIORRHAPHY;  Surgeon: Jamesetta So, MD;  Location: [facility];  Service: General;  Laterality: Right;  . INSERTION OF MESH Right 03/01/2013   Procedure: INSERTION OF MESH;  Surgeon: Jamesetta So, MD;  Location: [facility];  Service: General;  Laterality: Right;  . LOWER EXTREMITY ANGIOGRAPHY Right 08/25/2018   Procedure: LOWER EXTREMITY ANGIOGRAPHY;  Surgeon: Wellington Hampshire, MD;   Location: [facility];  Service: Cardiovascular;  Laterality: Right;  . NM MYOCAR PERF WALL MOTION  06/05/2010   no significant ischemia  . PERIPHERAL VASCULAR ATHERECTOMY Right 08/03/2018   Procedure: PERIPHERAL VASCULAR ATHERECTOMY;  Surgeon: Lorretta Harp, MD;  Location: [facility];  Service: Cardiovascular;  Laterality: Right;  . stent  06/14/2010   left leg  . US ECHOCARDIOGRAPHY  01/21/2006   moderate mitral annular ca+, mild MR, AOV moderately sclerotic.        Home Medications    Prior to Admission medications   Medication Sig Start Date End Date Taking? Authorizing Provider  acetaminophen (TYLENOL) 325 MG tablet Take 1-2 tablets (325-650 mg total) by mouth every 6 (six) hours as needed for mild pain (pain score 1-3 or temp > 100.5). 09/22/18  Yes Angiulli, Lavon Paganini, PA-C  apixaban (ELIQUIS) 2.5 MG TABS tablet Take 1 tablet (2.5 mg total) by mouth 2 (two) times daily. 10/09/18  Yes Lorretta Harp, MD  aspirin EC 81 MG tablet Take 81 mg by mouth daily.   Yes [provider]  atorvastatin (LIPITOR) 40 MG tablet Take 1 tablet (40 mg total) by mouth daily. 09/22/18  Yes Angiulli, Lavon Paganini, PA-C  dorzolamide-timolol (COSOPT) 22.3-6.8 MG/ML ophthalmic solution Place 1 drop into both eyes 2 (two) times daily.   Yes [provider]  doxazosin (CARDURA) 2 MG tablet Take 1 tablet (2 mg total) by mouth at bedtime. 09/22/18  Yes Angiulli, Lavon Paganini, PA-C  famotidine (PEPCID) 20 MG tablet Take 1 tablet (20 mg total) by mouth daily. 11/12/18  Yes Johnson, Clanford L, MD  finasteride (PROSCAR) 5 MG tablet Take 1 tablet (5 mg total) by mouth every evening. 09/22/18  Yes Angiulli, Lavon Paganini, PA-C  furosemide (LASIX) 40 MG tablet Take 1 tablet (40 mg total) by mouth 2 (two) times daily. 11/17/18  Yes Johnson, Clanford L, MD  glipiZIDE (GLUCOTROL) 5 MG tablet Take 5 mg by mouth 2 (two) times daily.    Yes [provider]  levothyroxine (SYNTHROID) 50 MCG tablet Take  1 tablet (50 mcg total) by mouth daily before breakfast. 09/22/18  Yes Angiulli, Lavon Paganini, PA-C  Omega-3 Fatty Acids (FISH OIL PO) Take 1 capsule by mouth daily.   Yes [provider]  potassium chloride (KLOR-CON) 10 MEQ tablet Take 1 tablet (10 mEq total) by mouth daily. 11/12/18  Yes Johnson, Clanford L, MD  povidone-Iodine (BETADINE) 5 % SOLN topical solution Apply 1 application topically daily as needed for wound care.   Yes [provider]  SANTYL ointment Apply 1 application topically daily.  11/06/18  Yes [provider]  tamsulosin (FLOMAX) 0.4 MG CAPS capsule Take 1 capsule (0.4 mg total) by mouth 2 (two) times daily. 09/22/18  Yes Angiulli, Lavon Paganini, PA-C  vitamin C (ASCORBIC ACID) 500 MG tablet Take 500 mg by mouth daily.   Yes [provider]  vitamin E 400 UNIT capsule Take 400 Units by mouth daily.   Yes [provider]    Family History Family History  Problem Relation Age of Onset  . Cancer Mother   . Heart attack Father   . Stroke Father   . Heart attack Brother   . Heart attack Brother     Social History Social History   Tobacco Use  . Smoking status: Former Smoker    Quit date: 01/14/1935    Years since quitting: 83.9  . Smokeless tobacco: Former Systems developer    Types: Chew  Substance Use Topics  . Alcohol use: No  . Drug use: No     Allergies   Iodinated diagnostic agents, Dye fdc red [red dye], Iodine-131, and Iohexol   Review of Systems Review of Systems  Constitutional: Negative for chills and fever.  HENT: Negative for ear pain and sore throat.   Eyes: Negative for pain and visual disturbance.  Respiratory: Negative for cough and shortness of breath.   Cardiovascular: Negative for chest pain and palpitations.  Gastrointestinal: Negative for abdominal pain and vomiting.  Genitourinary: Negative for dysuria and hematuria.  Musculoskeletal: Positive for arthralgias. Negative for back pain.  Skin: Positive for  color change and rash.  Neurological: Negative for seizures and syncope.  All other systems reviewed and are negative.    Physical Exam Updated Vital Signs BP 139/63   Pulse 61   Temp 98.6 F (37 C) (Oral)   Resp 17   SpO2 100%   Physical Exam Vitals signs and nursing note reviewed.  Constitutional:      Appearance: He is well-developed.  HENT:     Head: Normocephalic and atraumatic.  Eyes:     Conjunctiva/sclera: Conjunctivae normal.  Neck:     Musculoskeletal: Neck supple.  Cardiovascular:     Rate and Rhythm: Normal rate and regular rhythm.     Heart sounds: No murmur.  Pulmonary:     Effort: Pulmonary effort is normal. No respiratory distress.     Breath sounds: Normal breath sounds.  Abdominal:     Palpations: Abdomen is soft.     Tenderness: There is no abdominal tenderness.  Musculoskeletal:     Comments: RLE: s/p BKA LLE: ~1cm dia black lesion over distal tip of great toe, no active drainage, generalized erythema over toes and mid foot; erythema does not extend up leg  Skin:    General: Skin is warm and dry.     Comments: See above  Neurological:     Mental Status: He is alert.      ED Treatments / Results  Labs (all labs ordered are listed, but only abnormal results are displayed) Labs Reviewed  COMPREHENSIVE METABOLIC PANEL - Abnormal; Notable for the following components:      Result Value   Sodium 132 (*)    Chloride 96 (*)    Glucose, Bld 111 (*)    BUN 31 (*)    Creatinine, Ser 1.27 (*)    Calcium 8.8 (*)    Albumin 3.3 (*)    GFR calc non Af Amer 50 (*)    GFR calc Af Amer 58 (*)    All other components within normal limits  URINALYSIS, ROUTINE W REFLEX MICROSCOPIC - Abnormal; Notable for the following components:   Leukocytes,Ua TRACE (*)    Bacteria, UA RARE (*)    All other components within normal limits  CBC WITH DIFFERENTIAL/PLATELET - Abnormal; Notable for the following components:   WBC 10.8 (*)    RBC 3.77 (*)    Hemoglobin  11.5 (*)  HCT 34.9 (*)    Eosinophils Absolute 0.7 (*)    All other components within normal limits  CULTURE, BLOOD (ROUTINE X 2)  CULTURE, BLOOD (ROUTINE X 2)  SARS CORONAVIRUS 2 (TAT 6-24 HRS)  LACTIC ACID, PLASMA  LACTIC ACID, PLASMA  CBC WITH DIFFERENTIAL/PLATELET  SEDIMENTATION RATE  C-REACTIVE PROTEIN  BRAIN NATRIURETIC PEPTIDE    EKG None  Radiology Dg Chest 2 View  Result Date: 11/18/2018 CLINICAL DATA:  85 y.o male. Hx CHF, HTN. Recent admission for same. EXAM: CHEST - 2 VIEW COMPARISON:  Chest radiograph 11/13/2018 FINDINGS: Stable cardiomediastinal contours with enlarged heart size. Central vascular congestion. Hazy bibasilar pulmonary opacities likely reflecting combination of atelectasis and pleural fluid, superimposed infection not excluded. Moderate bilateral pleural effusions. No pneumothorax. No acute finding in the visualized skeleton. IMPRESSION: 1. Cardiomegaly with central vascular congestion. 2. Hazy bibasilar pulmonary opacities likely reflecting a combination of atelectasis and pleural fluid, superimposed infection not excluded. Moderate bilateral pleural effusions. Electronically Signed   By: Audie Pinto M.D.   On: 11/18/2018 19:44   Dg Foot 2 Views Left  Result Date: 11/18/2018 CLINICAL DATA:  85 y.o male. Black color to greater toe, redness and swelling. EXAM: LEFT FOOT - 2 VIEW COMPARISON:  None. FINDINGS: There is no evidence of fracture or dislocation. No evidence of bony destruction. Moderate scattered degenerative changes. Extensive vascular calcification. IMPRESSION: No acute osseous abnormality in the left foot. Electronically Signed   By: Audie Pinto M.D.   On: 11/18/2018 19:42    Procedures .Critical Care Performed by: Lucrezia Starch, MD Authorized by: Lucrezia Starch, MD   Critical care provider statement:    Critical care time (minutes):  36   Critical care was necessary to treat or prevent imminent or life-threatening  deterioration of the following conditions:  Sepsis and circulatory failure   Critical care was time spent personally by me on the following activities:  Discussions with consultants, evaluation of patient's response to treatment, examination of patient, ordering and performing treatments and interventions, ordering and review of laboratory studies, ordering and review of radiographic studies, pulse oximetry, re-evaluation of patient's condition, obtaining history from patient or surrogate and review of old charts   (including critical care time)  Medications Ordered in ED Medications  sodium chloride flush (NS) 0.9 % injection 3 mL (has no administration in time range)  vancomycin (VANCOCIN) 2,000 mg in sodium chloride 0.9 % 500 mL IVPB (has no administration in time range)  piperacillin-tazobactam (ZOSYN) IVPB 3.375 g (3.375 g Intravenous New Bag/Given 11/18/18 2250)     Initial Impression / Assessment and Plan / ED Course  I have reviewed the triage vital signs and the nursing notes.  Pertinent labs & imaging results that were available during my care of the patient were reviewed by me and considered in my medical decision making (see chart for details).  Clinical Course as of Nov 17 2356  Wed Nov 18, 2018  2338 Discussed with Louanne Skye, recommends hospitalist admission, transfer to [facility] will evaluate in a.m.   [RD]    Clinical Course User Index [RD] Lucrezia Starch, MD       85 year old male, diabetic, peripheral vascular disease, history of right lower extremity gangrenous wounds requiring amputation, now with right BKA presents to ER with left great toe wound.  Significant overlying erythema, warmth to toes and foot.  Obtained blood cultures, started broad-spectrum antibiotics.  Consulted Ortho and admit to hospitalist for further management.  Dr. Maudie Mercury will  accept.  Final Clinical Impressions(s) / ED Diagnoses   Final diagnoses:  Diabetic ulcer of toe of left foot  associated with type 2 diabetes mellitus, unspecified ulcer stage (HCC)  Cellulitis of left lower extremity  Leukocytosis, unspecified type  Peripheral vascular disease [facility])    ED Discharge Orders    None       Lucrezia Starch, MD 11/18/18 2358    Lucrezia Starch, MD 12/09/18 1036

## 2018-11-18 NOTE — Progress Notes (Signed)
QUAY, SIMKIN ([id number]) Visit Report for 11/18/2018 Chief Complaint Document Details Patient Name: Date of Service: [name], [name] 11/18/2018 1:45 PM Medical Record [id number] Patient Account Number: [id number] Date of Birth/Sex: Treating RN: Feb 22, 1927 (85 y.o. Ernestene Mention Primary Care Provider: [employer] Other Clinician: Referring Provider: Treating Provider/Extender:Stone III, Elaina Pattee, Moses Manners in Treatment: 2 Information Obtained from: Patient Chief Complaint Left foot and ankle ulcer Electronic Signature(s) Signed: 11/18/2018 6:48:11 PM By: Worthy Keeler PA-C Entered By: Worthy Keeler on 11/18/2018 14:42:33 -------------------------------------------------------------------------------- HPI Details Patient Name: Date of Service: [name], [name] 11/18/2018 1:45 PM Medical Record [id number] Patient Account Number: [id number] Date of Birth/Sex: Treating RN: 1927/09/22 (85 y.o. Ernestene Mention Primary Care Provider: [employer] Other Clinician: Referring Provider: Treating Provider/Extender:Stone III, Elaina Pattee, Moses Manners in Treatment: 2 History of Present Illness HPI Description: ADMISSION 08/11/2018 This is an 85 year old man who arrives accompanied by his daughter who is his caregiver. Reasonably independent living in [place]. His problem started on April 23. He apparently clipped his toenails too close. He then saw podiatry who actually remove the nail. He developed ischemic gangrene of the toe and had multiple rounds of antibiotics. Arterial Dopplers performed on 07/21/2018 revealed an ABI of 0.43 with no vessel runoff. He underwent an angiogram by Dr. Gwenlyn Found on 07/27/2018 for critical limb ischemia. He had a previous stent placed in the left SFA which was widely patent. However there was high-grade calcific plaque in the distal left SFA and popliteal artery with 0 vessel runoff. On the right he had a subtotally occluded  calcified distal right SFA and above-knee popliteal artery with 0 vessel runoff. He did not have an identifiable tibial vessel. There were collaterals. On 08/03/2018 the patient had a successful diamond orbital rotational atherectomy balloon angioplasty followed by a drug-coated balloon angioplasty of a highly calcified segmental distal right SFA and above the knee popliteal artery. He had follow-up noninvasive arterial studies on 7/27. This showed an improvement of his ABI on the right from 0.43 2.61. Since all of this occurred he also traumatized his right lateral heel apparently from a staple from his recliner. He was being followed by therapy at the [facility] wound care clinic they have been applying Xeroform The patient arrives today with dry gangrene of the right great toe. This digit is certainly nonsalvageable. He has a fairly large but superficial area over the dorsal part of his right foot. He also has an ischemic-looking wound on the right lateral heel. Past medical history includes hypertension, paroxysmal atrial fibrillation, type 2 diabetes with PAD, diastolic congestive heart failure, aortic valve stenosis, hypothyroidism, less left SFA stent previously placed 8/4-Patient returns with worsening discoloration on the plantar aspect of his right great toe, dry gangrene of the right great toe, more pain and swelling in the forefoot, also discussed at length on the phone with Dr. Alvester Chou today regarding options for revascularization in the distal foot which were already discussed by Dr. Dellia Nims and him at the last visit Readmission: 11/04/2018 on evaluation today patient appears to be doing somewhat poorly in regard to his left lower extremity. He has since we last saw him had an amputation of the right lower extremity. On the left at this time he has a wound that is really right at the bridge between his ankle and foot more laterally and then he has a toe wound on the distal portion of  his left great toe which appears to really be more likely deep tissue  injury possibly from his shoes. Either way the main issue that I see at this point is that the patient seems to be having issues with these wounds healing he does have poor arterial flow in fact his most recent ABI on the left which was performed on 10/15/2018 showed that he had an ABI of 0.51. At this point Dr. Alvester Chou really does not feel like that it would be beneficial for this patient have any additional revascularization he is hopeful that maybe will be able to get this area to heal. With that being said there is no signs of of active infection at this time. 11/18/2018 on evaluation today patient unfortunately has just gotten out of the hospital this past Monday secondary to overall fluid overload where he had roughly 4 L of fluid pulled off. Subsequently his breathing has been a lot better which is good news. His wounds have been maintaining stability and even yesterday when they took a picture of the great toe ulcer it appeared to be stable with the dry eschar. With that being said beginning this morning and progressing throughout today the toe is actually more swollen with fluid buildup within the toe and it appears to be in my opinion at least of concern for progressing to wet gangrene. I am concerned that if we do not see about doing something from an infection standpoint to try to get this under control that the patient may be at risk for further amputation and that is definitely something that the patient and his family are trying to avoid. With that being said he does not have any signs of sepsis noted at this point which is good news. Electronic Signature(s) Signed: 11/18/2018 6:48:11 PM By: Worthy Keeler PA-C Entered By: Worthy Keeler on 11/18/2018 15:43:43 -------------------------------------------------------------------------------- Physical Exam Details Patient Name: Date of Service: [name], [name]  11/18/2018 1:45 PM Medical Record [id number] Patient Account Number: [id number] Date of Birth/Sex: Treating RN: Jan 23, 1927 (85 y.o. Ernestene Mention Primary Care Provider: [employer] Other Clinician: Referring Provider: Treating Provider/Extender:Stone III, Elaina Pattee, Moses Manners in Treatment: 2 Constitutional Well-nourished and well-hydrated in no acute distress. Respiratory normal breathing without difficulty. clear to auscultation bilaterally. Cardiovascular regular rate and rhythm with normal S1, S2. Psychiatric this patient is able to make decisions and demonstrates good insight into disease process. Alert and Oriented x 3. pleasant and cooperative. Notes Upon inspection today patient's wound bed in regard to the ankle seems to be doing okay as I do not see any signs of active infection at this point. With regard to the toe unfortunately I feel like the stable eschar is developing into more of what appears to be the potential for a wet gangrene scenario with potential for infection although is not hot to touch there is definite discoloration and fluid including blistering and there is really nothing else to account for this he has not been doing any excessive walking or otherwise that would contribute to this change in worsening. Electronic Signature(s) Signed: 11/18/2018 6:48:11 PM By: Worthy Keeler PA-C Entered By: Worthy Keeler on 11/18/2018 15:44:32 -------------------------------------------------------------------------------- Physician Orders Details Patient Name: Date of Service: [name], [name] 11/18/2018 1:45 PM Medical Record [id number] Patient Account Number: [id number] Date of Birth/Sex: Treating RN: March 12, 1927 (85 y.o. Ernestene Mention Primary Care Provider: [employer] Other Clinician: Referring Provider: Treating Provider/Extender:Stone III, Elaina Pattee, Moses Manners in Treatment: 2 Verbal / Phone Orders: No Diagnosis  Coding ICD-10 Coding Code Description E11.621 Type 2 diabetes mellitus  with foot ulcer L97.528 Non-pressure chronic ulcer of other part of left foot with other specified severity L97.328 Non-pressure chronic ulcer of left ankle with other specified severity I73.89 Other specified peripheral vascular diseases I10 Essential (primary) hypertension Follow-up Appointments Return Appointment in 1 week. Other: - Go to emergency room for evaluation of probable left foot infection with PAD Dressing Change Frequency Wound #5 Left Toe Great Change dressing every day. Wound #6 Left,Lateral Ankle Change dressing every day. Wound Cleansing Wound #5 Left Toe Great Clean wound with Normal Saline. - or wound cleanser Wound #6 Left,Lateral Ankle Clean wound with Normal Saline. - or wound cleanser Primary Wound Dressing Wound #5 Left Toe Great Other: - Paint with Betadine Wound #6 Left,Lateral Ankle Santyl Ointment Secondary Dressing Wound #6 Left,Lateral Ankle Foam Border - or large bandaid Edema Control Elevate legs to the level of the heart or above for 30 minutes daily and/or when sitting, a frequency of: - throughout the day [facility] for wound care. - Advanced Electronic Signature(s) Signed: 11/18/2018 6:42:58 PM By: Baruch Gouty RN, BSN Signed: 11/18/2018 6:48:11 PM By: Worthy Keeler PA-C Entered By: Baruch Gouty on 11/18/2018 15:42:30 -------------------------------------------------------------------------------- Problem List Details Patient Name: Date of Service: [name] 11/18/2018 1:45 PM Medical Record [id number] Patient Account Number: [id number] Date of Birth/Sex: Treating RN: 03/14/1927 (85 y.o. Ernestene Mention Primary Care Provider: [employer] Other Clinician: Referring Provider: Treating Provider/Extender:Stone III, Elaina Pattee, Moses Manners in Treatment: 2 Active Problems ICD-10 Evaluated  Encounter Code Description Active Date Today Diagnosis E11.621 Type 2 diabetes mellitus with foot ulcer 11/04/2018 No Yes L97.528 Non-pressure chronic ulcer of other part of left foot 11/04/2018 No Yes with other specified severity L97.328 Non-pressure chronic ulcer of left ankle with other 11/04/2018 No Yes specified severity I73.89 Other specified peripheral vascular diseases 11/04/2018 No Yes I10 Essential (primary) hypertension 11/04/2018 No Yes Inactive Problems Resolved Problems Electronic Signature(s) Signed: 11/18/2018 6:48:11 PM By: Worthy Keeler PA-C Entered By: Worthy Keeler on 11/18/2018 14:42:28 -------------------------------------------------------------------------------- Progress Note Details Patient Name: Date of Service: [name] 11/18/2018 1:45 PM Medical Record [id number] Patient Account Number: [id number] Date of Birth/Sex: Treating RN: 06/04/1927 (85 y.o. Ernestene Mention Primary Care Provider: [employer] Other Clinician: Referring Provider: Treating Provider/Extender:Stone III, Elaina Pattee, Moses Manners in Treatment: 2 Subjective Chief Complaint Information obtained from Patient Left foot and ankle ulcer History of Present Illness (HPI) ADMISSION 08/11/2018 This is an 85 year old man who arrives accompanied by his daughter who is his caregiver. Reasonably independent living in [place]. His problem started on April 23. He apparently clipped his toenails too close. He then saw podiatry who actually remove the nail. He developed ischemic gangrene of the toe and had multiple rounds of antibiotics. Arterial Dopplers performed on 07/21/2018 revealed an ABI of 0.43 with no vessel runoff. He underwent an angiogram by Dr. Gwenlyn Found on 07/27/2018 for critical limb ischemia. He had a previous stent placed in the left SFA which was widely patent. However there was high-grade calcific plaque in the distal left SFA and popliteal artery with 0 vessel  runoff. On the right he had a subtotally occluded calcified distal right SFA and above-knee popliteal artery with 0 vessel runoff. He did not have an identifiable tibial vessel. There were collaterals. On 08/03/2018 the patient had a successful diamond orbital rotational atherectomy balloon angioplasty followed by a drug-coated balloon angioplasty of a highly calcified segmental distal right SFA and above the knee popliteal artery.  He had follow-up noninvasive arterial studies on 7/27. This showed an improvement of his ABI on the right from 0.43 2.61. Since all of this occurred he also traumatized his right lateral heel apparently from a staple from his recliner. He was being followed by therapy at the [facility] wound care clinic they have been applying Xeroform The patient arrives today with dry gangrene of the right great toe. This digit is certainly nonsalvageable. He has a fairly large but superficial area over the dorsal part of his right foot. He also has an ischemic-looking wound on the right lateral heel. Past medical history includes hypertension, paroxysmal atrial fibrillation, type 2 diabetes with PAD, diastolic congestive heart failure, aortic valve stenosis, hypothyroidism, less left SFA stent previously placed 8/4-Patient returns with worsening discoloration on the plantar aspect of his right great toe, dry gangrene of the right great toe, more pain and swelling in the forefoot, also discussed at length on the phone with Dr. Alvester Chou today regarding options for revascularization in the distal foot which were already discussed by Dr. Dellia Nims and him at the last visit Readmission: 11/04/2018 on evaluation today patient appears to be doing somewhat poorly in regard to his left lower extremity. He has since we last saw him had an amputation of the right lower extremity. On the left at this time he has a wound that is really right at the bridge between his ankle and foot more laterally and  then he has a toe wound on the distal portion of his left great toe which appears to really be more likely deep tissue injury possibly from his shoes. Either way the main issue that I see at this point is that the patient seems to be having issues with these wounds healing he does have poor arterial flow in fact his most recent ABI on the left which was performed on 10/15/2018 showed that he had an ABI of 0.51. At this point Dr. Alvester Chou really does not feel like that it would be beneficial for this patient have any additional revascularization he is hopeful that maybe will be able to get this area to heal. With that being said there is no signs of of active infection at this time. 11/18/2018 on evaluation today patient unfortunately has just gotten out of the hospital this past Monday secondary to overall fluid overload where he had roughly 4 L of fluid pulled off. Subsequently his breathing has been a lot better which is good news. His wounds have been maintaining stability and even yesterday when they took a picture of the great toe ulcer it appeared to be stable with the dry eschar. With that being said beginning this morning and progressing throughout today the toe is actually more swollen with fluid buildup within the toe and it appears to be in my opinion at least of concern for progressing to wet gangrene. I am concerned that if we do not see about doing something from an infection standpoint to try to get this under control that the patient may be at risk for further amputation and that is definitely something that the patient and his family are trying to avoid. With that being said he does not have any signs of sepsis noted at this point which is good news. Patient History Information obtained from Patient. Family History Cancer - Mother, Heart Disease - Father, Hypertension - Father, Stroke - Father, No family history of Diabetes, Hereditary Spherocytosis, Kidney Disease, Lung Disease,  Seizures, Thyroid Problems, Tuberculosis. Social History Former smoker -  quit over 50 yrs ago, Marital Status - Widowed, Alcohol Use - Never, Drug Use - No History, Caffeine Use - Daily - coffee. Medical History Eyes Patient has history of Cataracts - bil removed Denies history of Glaucoma, Optic Neuritis Respiratory Patient has history of Sleep Apnea - no CPAP Cardiovascular Patient has history of Arrhythmia - afib, Congestive Heart Failure, Hypertension, Peripheral Arterial Disease Endocrine Patient has history of Type II Diabetes Denies history of Type I Diabetes Integumentary (Skin) Denies history of History of Burn Musculoskeletal Patient has history of Osteoarthritis Denies history of Gout, Rheumatoid Arthritis, Osteomyelitis Neurologic Patient has history of Neuropathy Denies history of Dementia, Quadriplegia, Paraplegia, Seizure Disorder Psychiatric Denies history of Anorexia/bulimia, Confinement Anxiety Hospitalization/Surgery History - angiogram. - left knee surgery. - hernia repair. Medical And Surgical History Notes Cardiovascular aortic stenosis, RBBB, hyperlipidemia Gastrointestinal GERD Endocrine hypothyroidism Genitourinary BPH Review of Systems (ROS) Constitutional Symptoms (General Health) Denies complaints or symptoms of Fatigue, Fever, Chills, Marked Weight Change. Respiratory Denies complaints or symptoms of Chronic or frequent coughs, Shortness of Breath. Cardiovascular Denies complaints or symptoms of Chest pain. Psychiatric Denies complaints or symptoms of Claustrophobia, Suicidal. Objective Constitutional Well-nourished and well-hydrated in no acute distress. Vitals Time Taken: 2:28 PM, Height: 71 in, Weight: 220 lbs, BMI: 30.7, Temperature: 98.7 F, Pulse: 62 bpm, Respiratory Rate: 18 breaths/min, Blood Pressure: 161/78 mmHg. Respiratory normal breathing without difficulty. clear to auscultation bilaterally. Cardiovascular regular rate  and rhythm with normal S1, S2. Psychiatric this patient is able to make decisions and demonstrates good insight into disease process. Alert and Oriented x 3. pleasant and cooperative. General Notes: Upon inspection today patient's wound bed in regard to the ankle seems to be doing okay as I do not see any signs of active infection at this point. With regard to the toe unfortunately I feel like the stable eschar is developing into more of what appears to be the potential for a wet gangrene scenario with potential for infection although is not hot to touch there is definite discoloration and fluid including blistering and there is really nothing else to account for this he has not been doing any excessive walking or otherwise that would contribute to this change in worsening. Integumentary (Hair, Skin) Wound #5 status is Open. Original cause of wound was Blister. The wound is located on the Left Toe Great. The wound measures 4cm length x 2cm width x 0.1cm depth; 6.283cm^2 area and 0.628cm^3 volume. There is no tunneling or undermining noted. There is a none present amount of drainage noted. There is no granulation within the wound bed. There is a large (67-100%) amount of necrotic tissue within the wound bed including Eschar and Adherent Slough. Wound #6 status is Open. Original cause of wound was Blister. The wound is located on the Left,Lateral Ankle. The wound measures 0.5cm length x 0.6cm width x 0.1cm depth; 0.236cm^2 area and 0.024cm^3 volume. There is no tunneling or undermining noted. There is a none present amount of drainage noted. There is no granulation within the wound bed. There is a large (67-100%) amount of necrotic tissue within the wound bed including Eschar and Adherent Slough. Assessment Active Problems ICD-10 Type 2 diabetes mellitus with foot ulcer Non-pressure chronic ulcer of other part of left foot with other specified severity Non-pressure chronic ulcer of left ankle  with other specified severity Other specified peripheral vascular diseases Essential (primary) hypertension Plan Follow-up Appointments: Return Appointment in 1 week. Other: - Go to emergency room for evaluation of probable left  foot infection with PAD Dressing Change Frequency: Wound #5 Left Toe Great: Change dressing every day. Wound #6 Left,Lateral Ankle: Change dressing every day. Wound Cleansing: Wound #5 Left Toe Great: Clean wound with Normal Saline. - or wound cleanser Wound #6 Left,Lateral Ankle: Clean wound with Normal Saline. - or wound cleanser Primary Wound Dressing: Wound #5 Left Toe Great: Other: - Paint with Betadine Wound #6 Left,Lateral Ankle: Santyl Ointment Secondary Dressing: Wound #6 Left,Lateral Ankle: Foam Border - or large bandaid Edema Control: Elevate legs to the level of the heart or above for 30 minutes daily and/or when sitting, a frequency of: - throughout the day Home Health: [facility] skilled [facility] for wound care. - Advanced 1. My suggestion at this point is good to be that we go ahead and have the patient go to the ER for further evaluation of this great toe ulcer and the subsequent shift that seems to be indicative of infection in regard to the toe. Again I am concerned about gangrene. Obviously they are trying to attempt and prevent any need for amputation. 2. With regard to the dressings I do believe that still the Santyl would be beneficial for his ankle area although for the toe I think the Betadine while fine is not can be enough alone I think he needs more advanced therapy potentially even IV antibiotics to arrest this infection before things get out of control. 3. I am sending the patient to the ER following evaluation here he is can go to [facility] his son is going to take him he is the caregiver for the patient and will be there with him as well. I am sending a copy of this note with him. We will see patient back for  reevaluation in 1 week here in the clinic. If anything worsens or changes patient will contact our office for additional recommendations. Electronic Signature(s) Signed: 11/18/2018 6:48:11 PM By: Worthy Keeler PA-C Entered By: Worthy Keeler on 11/18/2018 15:45:48 -------------------------------------------------------------------------------- HxROS Details Patient Name: Date of Service: [name], [name] 11/18/2018 1:45 PM Medical Record [id number] Patient Account Number: [id number] Date of Birth/Sex: Treating RN: 1927-06-28 (85 y.o. Ernestene Mention Primary Care Provider: [employer] Other Clinician: Referring Provider: Treating Provider/Extender:Stone III, Elaina Pattee, Moses Manners in Treatment: 2 Information Obtained From Patient Constitutional Symptoms (General Health) Complaints and Symptoms: Negative for: Fatigue; Fever; Chills; Marked Weight Change Respiratory Complaints and Symptoms: Negative for: Chronic or frequent coughs; Shortness of Breath Medical History: Positive for: Sleep Apnea - no CPAP Cardiovascular Complaints and Symptoms: Negative for: Chest pain Medical History: Positive for: Arrhythmia - afib; Congestive Heart Failure; Hypertension; Peripheral Arterial Disease Past Medical History Notes: aortic stenosis, RBBB, hyperlipidemia Psychiatric Complaints and Symptoms: Negative for: Claustrophobia; Suicidal Medical History: Negative for: Anorexia/bulimia; Confinement Anxiety Eyes Medical History: Positive for: Cataracts - bil removed Negative for: Glaucoma; Optic Neuritis Gastrointestinal Medical History: Past Medical History Notes: GERD Endocrine Medical History: Positive for: Type II Diabetes Negative for: Type I Diabetes Past Medical History Notes: hypothyroidism Time with diabetes: 10 Treated with: Oral agents Blood sugar tested every day: No Genitourinary Medical History: Past Medical History Notes: BPH Integumentary  (Skin) Medical History: Negative for: History of Burn Musculoskeletal Medical History: Positive for: Osteoarthritis Negative for: Gout; Rheumatoid Arthritis; Osteomyelitis Neurologic Medical History: Positive for: Neuropathy Negative for: Dementia; Quadriplegia; Paraplegia; Seizure Disorder HBO Extended History Items Eyes: Cataracts Immunizations Pneumococcal Vaccine: Received Pneumococcal Vaccination: Yes Implantable Devices Yes Hospitalization / Surgery History Type of Hospitalization/Surgery angiogram left  knee surgery hernia repair Family and Social History Cancer: Yes - Mother; Diabetes: No; Heart Disease: Yes - Father; Hereditary Spherocytosis: No; Hypertension: Yes - Father; Kidney Disease: No; Lung Disease: No; Seizures: No; Stroke: Yes - Father; Thyroid Problems: No; Tuberculosis: No; Former smoker - quit over 50 yrs ago; Marital Status - Widowed; Alcohol Use: Never; Drug Use: No History; Caffeine Use: Daily - coffee; Financial Concerns: No; Food, Clothing or Shelter Needs: No; Support System Lacking: No; Transportation Concerns: No Physician Affirmation I have reviewed and agree with the above information. Electronic Signature(s) Signed: 11/18/2018 6:42:58 PM By: Baruch Gouty RN, BSN Signed: 11/18/2018 6:48:11 PM By: Worthy Keeler PA-C Entered By: Worthy Keeler on 11/18/2018 15:44:09 -------------------------------------------------------------------------------- SuperBill Details Patient Name: Date of Service: [name] 11/18/2018 Medical Record [id number] Patient Account Number: [id number] Date of Birth/Sex: Treating RN: 07/13/1927 (85 y.o. Ernestene Mention Primary Care Provider: [employer] Other Clinician: Referring Provider: Treating Provider/Extender:Stone III, Elaina Pattee, Moses Manners in Treatment: 2 Diagnosis Coding ICD-10 Codes Code Description E11.621 Type 2 diabetes mellitus with foot ulcer L97.528 Non-pressure chronic  ulcer of other part of left foot with other specified severity L97.328 Non-pressure chronic ulcer of left ankle with other specified severity I73.89 Other specified peripheral vascular diseases I10 Essential (primary) hypertension Facility Procedures CPT4 Code: [id number] Description: 99214 - WOUND CARE VISIT-LEV 4 EST PT Modifier: Quantity: 1 Physician Procedures CPT4 Code Description: [id number] 99214 - WC PHYS LEVEL 4 - EST PT ICD-10 Diagnosis Description E11.621 Type 2 diabetes mellitus with foot ulcer L97.528 Non-pressure chronic ulcer of other part of left foot wit L97.328 Non-pressure chronic ulcer of left  ankle with other speci I73.89 Other specified peripheral vascular diseases Modifier: h other specifie fied severity Quantity: 1 d severity Electronic Signature(s) Signed: 11/18/2018 6:48:11 PM By: Worthy Keeler PA-C Entered By: Worthy Keeler on 11/18/2018 15:46:12

## 2018-11-18 NOTE — ED Triage Notes (Signed)
Pt and pt son report L big toe infection. L foot red and swollen. L big toe discolored; yellow, black, and red. Pt reports discomfort. Bandage to side of L foot, pt son reports "its a hole in the side of his foot". Pt son reports that the foot looks much worse than it did this morning, more discoloration.

## 2018-11-18 NOTE — ED Notes (Signed)
REQUESTED URINE. PT HAS A FOLEY

## 2018-11-18 NOTE — H&P (Signed)
° ° °                                                                           TRH H&P ° ° ° Patient Demographics:  ° ° [name], is a 85 y.o. male  MRN: [id number]  DOB - 01/04/1928 ° °Admit Date - 11/18/2018 ° °Referring MD/NP/PA:  Dykstra ° °Outpatient Primary MD for the patient is Fusco, Lawrence, MD °Marcus Duda  ° °Patient coming from:  home ° °Chief complaint-  Toe infection . ° ° HPI:  ° ° [name]  is a 85 y.o. male, w hypertension, hyperlipidemia, Dm2, ckd stage3, Pafib, CHF (diastolic), PAD, s/p R BKA presents from home due to black 1st toe left foot. Pt was seen by wound care earlier in the day and sent to ER for evaluation. Slight redness of the dorsum of the left foot, and skin ulcer 2-3mm on the lateral aspect of the left foot.   Pt denies fever, chills, cough, cp, palp, sob.  ° °In ED,  °T 98.2, P 65, R 18, Bp 167/73 pox 98% on RA ° °Left foot xray °IMPRESSION: °No acute osseous abnormality in the left foot. °  °CXR °IMPRESSION: °1. Cardiomegaly with central vascular congestion. °2. Hazy bibasilar pulmonary opacities likely reflecting a °combination of atelectasis and pleural fluid, superimposed infection not excluded. Moderate bilateral pleural effusions. °  °Na 132, K 3.8, Bun 31, Creatinine 1.27 °Alb 3.3 °Ast 33, Alt 25 °Wbc 10.8, Hgb 11.5, Plt 310 °BNP 213.1 °ESR 84 ° °Pt will be admitted for dry gangrene of the tip of the 1st top left foot ° ° ° ° Review of systems:  °  °In addition to the HPI above,  °No Fever-chills, °No Headache, No changes with Vision or hearing, °No problems swallowing food or Liquids, °No Chest pain, Cough or Shortness of Breath, °No Abdominal pain, No Nausea or Vomiting, bowel movements are regular, °No Blood in stool or Urine, °No dysuria, °  °No new joints pains-aches,  °No new weakness, tingling, numbness in any extremity, °No recent weight gain or loss, °No polyuria, polydypsia or polyphagia, °No significant Mental Stressors. ° °All other systems reviewed and  are negative. ° ° ° Past History of the following :  ° ° °Past Medical History:  °Diagnosis Date  °• Arthritis   °• Borderline diabetic   °• Glaucoma   °• Hyperlipidemia   °• Hypertension   °• PVD (peripheral vascular disease) (HCC)   °• Sleep apnea   ° Cpap ordered but doesnt use  °   ° °Past Surgical History:  °Procedure Laterality Date  °• ABDOMINAL AORTOGRAM W/LOWER EXTREMITY N/A 07/27/2018  ° Procedure: ABDOMINAL AORTOGRAM W/LOWER EXTREMITY;  Surgeon: Berry, Jonathan J, MD;  [facility];  Service: Cardiovascular;  Laterality: N/A;  °• AMPUTATION Right 08/28/2018  ° Procedure: RIGHT BELOW KNEE AMPUTATION;  Surgeon: Duda, Marcus V, MD;  Location: [facility];  Service: Orthopedics;  Laterality: Right;  °• APPENDECTOMY  1943  °• CATARACT EXTRACTION  2012  ° x2  °• COLONOSCOPY N/A 11/01/2014  ° Procedure: COLONOSCOPY;  Surgeon: Mark Jenkins, MD;  Location: [facility];  Service: Gastroenterology;  Laterality: N/A;  °• ESOPHAGOGASTRODUODENOSCOPY N/A 11/01/2014  °   Procedure: ESOPHAGOGASTRODUODENOSCOPY (EGD);  Surgeon: Mark Jenkins, MD;  Location: [facility];  Service: Gastroenterology;  Laterality: N/A;  °• HERNIA REPAIR Left   °• INGUINAL HERNIA REPAIR Right 03/01/2013  ° Procedure: RIGHT INGUINAL HERNIORRHAPHY;  Surgeon: Mark A Jenkins, MD;  Location: [facility];  Service: General;  Laterality: Right;  °• INSERTION OF MESH Right 03/01/2013  ° Procedure: INSERTION OF MESH;  Surgeon: Mark A Jenkins, MD;  Location: [facility];  Service: General;  Laterality: Right;  °• LOWER EXTREMITY ANGIOGRAPHY Right 08/25/2018  ° Procedure: LOWER EXTREMITY ANGIOGRAPHY;  Surgeon: Arida, Muhammad A, MD;  [facility];  Service: Cardiovascular;  Laterality: Right;  °• NM MYOCAR PERF WALL MOTION  06/05/2010  ° no significant ischemia  °• PERIPHERAL VASCULAR ATHERECTOMY Right 08/03/2018  ° Procedure: PERIPHERAL VASCULAR ATHERECTOMY;  Surgeon: Berry, Jonathan J, MD;  [facility];  Service:  Cardiovascular;  Laterality: Right;  °• stent  06/14/2010  ° left leg  °• US ECHOCARDIOGRAPHY  01/21/2006  ° moderate mitral annular ca+, mild MR, AOV moderately sclerotic.  ° ° ° ° Social History:  ° ° °  °Social History  ° °Tobacco Use  °• Smoking status: Former Smoker  °  Quit date: 01/14/1935  °  Years since quitting: 83.9  °• Smokeless tobacco: Former User  °  Types: Chew  °Substance Use Topics  °• Alcohol use: No  °  ° ° ° Family History :  ° °  °Family History  °Problem Relation Age of Onset  °• Cancer Mother   °• Heart attack Father   °• Stroke Father   °• Heart attack Brother   °• Heart attack Brother   ° °  ° ° Home Medications:  ° °Prior to Admission medications   °Medication Sig Start Date End Date Taking? Authorizing Provider  °acetaminophen (TYLENOL) 325 MG tablet Take 1-2 tablets (325-650 mg total) by mouth every 6 (six) hours as needed for mild pain (pain score 1-3 or temp > 100.5). 09/22/18  Yes Angiulli, Daniel J, PA-C  °apixaban (ELIQUIS) 2.5 MG TABS tablet Take 1 tablet (2.5 mg total) by mouth 2 (two) times daily. 10/09/18  Yes Berry, Jonathan J, MD  °aspirin EC 81 MG tablet Take 81 mg by mouth daily.   Yes [provider]  °atorvastatin (LIPITOR) 40 MG tablet Take 1 tablet (40 mg total) by mouth daily. 09/22/18  Yes Angiulli, Daniel J, PA-C  °dorzolamide-timolol (COSOPT) 22.3-6.8 MG/ML ophthalmic solution Place 1 drop into both eyes 2 (two) times daily.   Yes [provider]  °doxazosin (CARDURA) 2 MG tablet Take 1 tablet (2 mg total) by mouth at bedtime. 09/22/18  Yes Angiulli, Daniel J, PA-C  °famotidine (PEPCID) 20 MG tablet Take 1 tablet (20 mg total) by mouth daily. 11/12/18  Yes Johnson, Clanford L, MD  °finasteride (PROSCAR) 5 MG tablet Take 1 tablet (5 mg total) by mouth every evening. 09/22/18  Yes Angiulli, Daniel J, PA-C  °furosemide (LASIX) 40 MG tablet Take 1 tablet (40 mg total) by mouth 2 (two) times daily. 11/17/18  Yes Johnson, Clanford L, MD  °glipiZIDE (GLUCOTROL) 5 MG  tablet Take 5 mg by mouth 2 (two) times daily.    Yes [provider]  °levothyroxine (SYNTHROID) 50 MCG tablet Take 1 tablet (50 mcg total) by mouth daily before breakfast. 09/22/18  Yes Angiulli, Daniel J, PA-C  °Omega-3 Fatty Acids (FISH OIL PO) Take 1 capsule by mouth daily.   Yes Provider,   Yes [provider]  potassium chloride (KLOR-CON) 10 MEQ tablet Take 1 tablet (10 mEq total) by mouth daily. 11/12/18  Yes Johnson, Clanford L, MD  povidone-Iodine (BETADINE) 5 % SOLN topical solution Apply 1 application topically daily as needed for wound care.   Yes [provider]  SANTYL ointment Apply 1 application topically daily.  11/06/18  Yes [provider]  tamsulosin (FLOMAX) 0.4 MG CAPS capsule Take 1 capsule (0.4 mg total) by mouth 2 (two) times daily. 09/22/18  Yes Angiulli, Lavon Paganini, PA-C  vitamin C (ASCORBIC ACID) 500 MG tablet Take 500 mg by mouth daily.   Yes [provider]  vitamin E 400 UNIT capsule Take 400 Units by mouth daily.   Yes [provider]     Allergies:     Allergies  Allergen Reactions   Iodinated Diagnostic Agents     Other reaction(s): Fever   Dye Fdc Red [Red Dye] Hives   Iodine-131 Other (See Comments)    Breakout, fever   Iohexol      Code: RASH, Desc: PT STATES HOT ALL OVER HAD TO HAVE ICE PACKS ALL OVER HIM AND DIFF BREATHING, PT REFUSED IV DYE      Physical Exam:   Vitals  Blood pressure (!) 141/69, pulse 63, temperature 98.6 F (37 C), temperature source Oral, resp. rate 13, SpO2 98 %.  1.  General: axox3  2. Psychiatric: euthymic  3. Neurologic: cn2-12 intact, reflexes 2+ s ymmetric, diffuse with no clonus, motor 5/5 in all 4 ext  4. HEENMT:  Anicteric, pupils 1.103m symmetric, direct, consensual, near intact,  Neck: no jvd  5. Respiratory : CTAB  6. Cardiovascular : rrr s1, s2, 2/6 sem rusb/ apex  7. Gastrointestinal:  Abd; soft, nt, nd, +bs  8. Skin:  Ext: no c/c/e,  1st toe left foot  black area on the tip of the toe, dry gangrene,  Redness over the dorsum of the left foot and 2x365mround skin ulcer on the lateral aspect of the left foot  9.Musculoskeletal:  Good ROM    Data Review:    CBC Recent Labs  Lab 11/16/18 0504 11/18/18 2132  WBC 9.2 10.8*  HGB 9.8* 11.5*  HCT 30.4* 34.9*  PLT 256 310  MCV 93.8 92.6  MCH 30.2 30.5  MCHC 32.2 33.0  RDW 14.6 14.4  LYMPHSABS  --  3.6  MONOABS  --  0.9  EOSABS  --  0.7*  BASOSABS  --  0.1   ------------------------------------------------------------------------------------------------------------------  Results for orders placed or performed during the hospital encounter of 11/18/18 (from the past 48 hour(s))  Urinalysis, Routine w reflex microscopic     Status: Abnormal   Collection Time: 11/18/18  8:51 PM  Result Value Ref Range   Color, Urine YELLOW YELLOW   APPearance CLEAR CLEAR   Specific Gravity, Urine 1.008 1.005 - 1.030   pH 8.0 5.0 - 8.0   Glucose, UA NEGATIVE NEGATIVE mg/dL   Hgb urine dipstick NEGATIVE NEGATIVE   Bilirubin Urine NEGATIVE NEGATIVE   Ketones, ur NEGATIVE NEGATIVE mg/dL   Protein, ur NEGATIVE NEGATIVE mg/dL   Nitrite NEGATIVE NEGATIVE   Leukocytes,Ua TRACE (A) NEGATIVE   RBC / HPF 0-5 0 - 5 RBC/hpf   WBC, UA 0-5 0 - 5 WBC/hpf   Bacteria, UA RARE (A) NONE SEEN   Squamous Epithelial / LPF 0-5 0 - 5   Budding Yeast PRESENT     Comment: [facility]formed at [facility] [place], [place] [address]  Comprehensive metabolic panel     Status: Abnormal   Collection Time: 11/18/18  9:23 PM  Result Value Ref Range   Sodium 132 (L) 135 - 145 mmol/L   Potassium 3.8 3.5 - 5.1 mmol/L   Chloride 96 (L) 98 - 111 mmol/L   CO2 23 22 - 32 mmol/L   Glucose, Bld 111 (H) 70 - 99 mg/dL   BUN 31 (H) 8 - 23 mg/dL   Creatinine, Ser 1.27 (H) 0.61 - 1.24 mg/dL   Calcium 8.8 (L) 8.9 - 10.3 mg/dL   Total Protein 7.1 6.5 - 8.1 g/dL   Albumin 3.3 (L) 3.5 - 5.0 g/dL   AST 33  15 - 41 U/L   ALT 25 0 - 44 U/L   Alkaline Phosphatase 47 38 - 126 U/L   Total Bilirubin 0.7 0.3 - 1.2 mg/dL   GFR calc non Af Amer 50 (L) >60 mL/min   GFR calc Af Amer 58 (L) >60 mL/min   Anion gap 13 5 - 15    Comment: Performed at [facility], [place] [name] [place], [place] [address]  CBC with Differential/Platelet     Status: Abnormal   Collection Time: 11/18/18  9:32 PM  Result Value Ref Range   WBC 10.8 (H) 4.0 - 10.5 K/uL   RBC 3.77 (L) 4.22 - 5.81 MIL/uL   Hemoglobin 11.5 (L) 13.0 - 17.0 g/dL   HCT 34.9 (L) 39.0 - 52.0 %   MCV 92.6 80.0 - 100.0 fL   MCH 30.5 26.0 - 34.0 pg   MCHC 33.0 30.0 - 36.0 g/dL   RDW 14.4 11.5 - 15.5 %   Platelets 310 150 - 400 K/uL   nRBC 0.0 0.0 - 0.2 %   Neutrophils Relative % 51 %   Neutro Abs 5.6 1.7 - 7.7 K/uL   Lymphocytes Relative 33 %   Lymphs Abs 3.6 0.7 - 4.0 K/uL   Monocytes Relative 9 %   Monocytes Absolute 0.9 0.1 - 1.0 K/uL   Eosinophils Relative 6 %   Eosinophils Absolute 0.7 (H) 0.0 - 0.5 K/uL   Basophils Relative 1 %   Basophils Absolute 0.1 0.0 - 0.1 K/uL   Immature Granulocytes 0 %   Abs Immature Granulocytes 0.03 0.00 - 0.07 K/uL    Comment: Performed at [facility], [place] [address] [place], [place] [address]  Brain natriuretic peptide     Status: Abnormal   Collection Time: 11/18/18  9:32 PM  Result Value Ref Range   B Natriuretic Peptide 213.1 (H) 0.0 - 100.0 pg/mL    Comment: Performed at [facility], [place] [address] [place], [place] [address]  Lactic acid, plasma     Status: None   Collection Time: 11/18/18 10:09 PM  Result Value Ref Range   Lactic Acid, Venous 1.1 0.5 - 1.9 mmol/L    Comment: Performed at [facility], [place] [address], [place], [place] [address]  Sedimentation rate     Status: Abnormal   Collection Time: 11/18/18 10:09 PM  Result Value Ref Range   Sed Rate 84 (H) 0 - 16 mm/hr    Comment: Performed at [facility], [place] [address], [place], [place] [address]  C-reactive protein     Status: Abnormal   Collection Time: 11/18/18 10:09 PM  Result Value Ref Range   CRP 5.1 (H) <1.0 mg/dL    Comment: Performed at  [facility], [address] [place], [place] [address]  °Lactic acid, plasma     Status: None  ° Collection Time: 11/19/18 12:23 AM  °Result Value Ref Range  ° Lactic Acid, Venous 1.1 0.5 - 1.9 mmol/L  °  Comment: Performed at  [facility], [address] [place], [place] [address]  ° ° °Chemistries  °Recent Labs  °Lab 11/13/18 °0649 11/14/18 °0639 11/15/18 °0627 11/16/18 °0504 11/18/18 °2123  °NA 132* 133* 133* 131* 132*  °K 3.9 3.7 3.5 3.6 3.8  °CL 95* 93* 95* 93* 96*  °CO2 26 29 29 27 23  °GLUCOSE 120* 127* 123* 129* 111*  °BUN 33* 37* 35* 35* 31*  °CREATININE 1.29* 1.40* 1.33* 1.45* 1.27*  °CALCIUM 8.6* 8.7* 8.6* 8.4* 8.8*  °AST  --   --   --   --  33  °ALT  --   --   --   --  25  °ALKPHOS  --   --   --   --  47  °BILITOT  --   --   --   --  0.7  ° °------------------------------------------------------------------------------------------------------------------ ° °------------------------------------------------------------------------------------------------------------------ °GFR: °Estimated Creatinine Clearance: 48.6 mL/min (A) (by C-G formula based on SCr of 1.27 mg/dL (H)). °Liver Function Tests: °Recent Labs  °Lab 11/18/18 °2123  °AST 33  °ALT 25  °ALKPHOS 47  °BILITOT 0.7  °PROT 7.1  °ALBUMIN 3.3*  ° °No results for input(s): LIPASE, AMYLASE in the last 168 hours. °No results for input(s): AMMONIA in the last 168 hours. °Coagulation Profile: °No results for input(s): INR, PROTIME in the last 168 hours. °Cardiac Enzymes: °No results for input(s): CKTOTAL, CKMB, CKMBINDEX, TROPONINI in the last 168 hours. °BNP (last 3 results) °No results for input(s): PROBNP in the last 8760 hours. °HbA1C: °No results for input(s): HGBA1C in the last 72  hours. °CBG: °Recent Labs  °Lab 11/15/18 °1128 11/15/18 °1554 11/15/18 °2119 11/16/18 °0723 11/16/18 °1056  °GLUCAP 172* 125* 153* 127* 138*  ° °Lipid Profile: °No results for input(s): CHOL, HDL, LDLCALC, TRIG, CHOLHDL, LDLDIRECT in the last 72 hours. °Thyroid Function Tests: °No results for input(s): TSH, T4TOTAL, FREET4, T3FREE, THYROIDAB in the last 72 hours. °Anemia Panel: °No results for input(s): VITAMINB12, FOLATE, FERRITIN, TIBC, IRON, RETICCTPCT in the last 72 hours. ° °--------------------------------------------------------------------------------------------------------------- °Urine analysis: °   °Component Value Date/Time  ° COLORURINE YELLOW 11/18/2018 2051  ° APPEARANCEUR CLEAR 11/18/2018 2051  ° LABSPEC 1.008 11/18/2018 2051  ° PHURINE 8.0 11/18/2018 2051  ° GLUCOSEU NEGATIVE 11/18/2018 2051  ° HGBUR NEGATIVE 11/18/2018 2051  ° BILIRUBINUR NEGATIVE 11/18/2018 2051  ° KETONESUR NEGATIVE 11/18/2018 2051  ° PROTEINUR NEGATIVE 11/18/2018 2051  ° NITRITE NEGATIVE 11/18/2018 2051  ° LEUKOCYTESUR TRACE (A) 11/18/2018 2051  ° ° ° ° Imaging Results:  ° ° Dg Chest 2 View ° °Result Date: 11/18/2018 °CLINICAL DATA:  85 y.o male. Hx CHF, HTN. Recent admission for same. EXAM: CHEST - 2 VIEW COMPARISON:  Chest radiograph 11/13/2018 FINDINGS: Stable cardiomediastinal contours with enlarged heart size. Central vascular congestion. Hazy bibasilar pulmonary opacities likely reflecting combination of atelectasis and pleural fluid, superimposed infection not excluded. Moderate bilateral pleural effusions. No pneumothorax. No acute finding in the visualized skeleton. IMPRESSION: 1. Cardiomegaly with central vascular congestion. 2. Hazy bibasilar pulmonary opacities likely reflecting a combination of atelectasis and pleural fluid, superimposed infection not excluded. Moderate bilateral pleural effusions. Electronically Signed   By: Nancy  Ballantyne M.D.   On: 11/18/2018 19:44  ° °Dg Foot 2   Foot 2 Views Left  Result Date:  11/18/2018 CLINICAL DATA:  85 y.o male. Black color to greater toe, redness and swelling. EXAM: LEFT FOOT - 2 VIEW COMPARISON:  None. FINDINGS: There is no evidence of fracture or dislocation. No evidence of bony destruction. Moderate scattered degenerative changes. Extensive vascular calcification. IMPRESSION: No acute osseous abnormality in the left foot. Electronically Signed   By: Audie Pinto M.D.   On: 11/18/2018 19:42       Assessment & Plan:    Principal Problem:   Toe infection Active Problems:   PAD (peripheral artery disease) (HCC)   Essential hypertension, benign   Hyperlipidemia   Aortic valve stenosis   Hypothyroidism   PAF (paroxysmal atrial fibrillation) (HCC)   Type 2 diabetes mellitus with foot ulcer, without long-term current use of insulin (HCC)  1st toe left foot dry gangrene Cellulitis Skin ulcer  Blood culture x2 Vanco iv, Zosyn iv pharmacy to dose NPO after 3am Please consult Dr. Sharol Given in Am  PAD Cont Xarelto 2.35m po bid Cont aspirin 869mpo qday  Hyperlipidemia Cont Lipitor 4054mo qhs  Dm2 Hold Glipizide Fsbs q4h, ISS  Hypothyroidism Cont Levothyroxine 50 micrograms po qday  BPH Cont Flomax 0.4mg74m bid Cont Finasteride 5mg 63mqday  Glaucoma Cont Cosopt   DVT Prophylaxis-   xarelto  AM Labs Ordered, also please review Full Orders  Family Communication: Admission, patients condition and plan of care including tests being ordered have been discussed with the patient  who indicate understanding and agree with the plan and Code Status.  Code Status:  DNR per patient, notified son [facility]: Based on patients clinical presentation and evaluation of above clinical data, I have made determination that patient meets Inpatient criteria at this time.   Time spent in minutes : 70 minutes   JamesJani Gravelon 11/19/2018 at 1:36 AM

## 2018-11-19 ENCOUNTER — Encounter (HOSPITAL_COMMUNITY): Payer: Self-pay | Admitting: Internal Medicine

## 2018-11-19 ENCOUNTER — Telehealth: Payer: Self-pay | Admitting: Cardiovascular Disease

## 2018-11-19 DIAGNOSIS — Z743 Need for continuous supervision: Secondary | ICD-10-CM | POA: Diagnosis not present

## 2018-11-19 DIAGNOSIS — I70213 Atherosclerosis of native arteries of extremities with intermittent claudication, bilateral legs: Secondary | ICD-10-CM | POA: Diagnosis not present

## 2018-11-19 DIAGNOSIS — L97509 Non-pressure chronic ulcer of other part of unspecified foot with unspecified severity: Secondary | ICD-10-CM

## 2018-11-19 DIAGNOSIS — Z823 Family history of stroke: Secondary | ICD-10-CM | POA: Diagnosis not present

## 2018-11-19 DIAGNOSIS — E1152 Type 2 diabetes mellitus with diabetic peripheral angiopathy with gangrene: Secondary | ICD-10-CM | POA: Diagnosis not present

## 2018-11-19 DIAGNOSIS — L03116 Cellulitis of left lower limb: Secondary | ICD-10-CM | POA: Diagnosis not present

## 2018-11-19 DIAGNOSIS — R7309 Other abnormal glucose: Secondary | ICD-10-CM | POA: Diagnosis not present

## 2018-11-19 DIAGNOSIS — E538 Deficiency of other specified B group vitamins: Secondary | ICD-10-CM | POA: Diagnosis present

## 2018-11-19 DIAGNOSIS — L089 Local infection of the skin and subcutaneous tissue, unspecified: Secondary | ICD-10-CM | POA: Diagnosis not present

## 2018-11-19 DIAGNOSIS — I96 Gangrene, not elsewhere classified: Secondary | ICD-10-CM

## 2018-11-19 DIAGNOSIS — I70212 Atherosclerosis of native arteries of extremities with intermittent claudication, left leg: Secondary | ICD-10-CM | POA: Diagnosis not present

## 2018-11-19 DIAGNOSIS — G4730 Sleep apnea, unspecified: Secondary | ICD-10-CM | POA: Diagnosis not present

## 2018-11-19 DIAGNOSIS — S88112A Complete traumatic amputation at level between knee and ankle, left lower leg, initial encounter: Secondary | ICD-10-CM | POA: Diagnosis not present

## 2018-11-19 DIAGNOSIS — Z66 Do not resuscitate: Secondary | ICD-10-CM | POA: Diagnosis not present

## 2018-11-19 DIAGNOSIS — Z809 Family history of malignant neoplasm, unspecified: Secondary | ICD-10-CM | POA: Diagnosis not present

## 2018-11-19 DIAGNOSIS — I482 Chronic atrial fibrillation, unspecified: Secondary | ICD-10-CM | POA: Diagnosis not present

## 2018-11-19 DIAGNOSIS — N183 Chronic kidney disease, stage 3 unspecified: Secondary | ICD-10-CM | POA: Diagnosis not present

## 2018-11-19 DIAGNOSIS — H9190 Unspecified hearing loss, unspecified ear: Secondary | ICD-10-CM | POA: Diagnosis present

## 2018-11-19 DIAGNOSIS — Z89511 Acquired absence of right leg below knee: Secondary | ICD-10-CM | POA: Diagnosis not present

## 2018-11-19 DIAGNOSIS — R0902 Hypoxemia: Secondary | ICD-10-CM | POA: Diagnosis not present

## 2018-11-19 DIAGNOSIS — R197 Diarrhea, unspecified: Secondary | ICD-10-CM | POA: Diagnosis not present

## 2018-11-19 DIAGNOSIS — J189 Pneumonia, unspecified organism: Secondary | ICD-10-CM | POA: Diagnosis not present

## 2018-11-19 DIAGNOSIS — R339 Retention of urine, unspecified: Secondary | ICD-10-CM | POA: Diagnosis not present

## 2018-11-19 DIAGNOSIS — Z20828 Contact with and (suspected) exposure to other viral communicable diseases: Secondary | ICD-10-CM | POA: Diagnosis present

## 2018-11-19 DIAGNOSIS — E11621 Type 2 diabetes mellitus with foot ulcer: Secondary | ICD-10-CM

## 2018-11-19 DIAGNOSIS — M79672 Pain in left foot: Secondary | ICD-10-CM | POA: Diagnosis not present

## 2018-11-19 DIAGNOSIS — L89623 Pressure ulcer of left heel, stage 3: Secondary | ICD-10-CM | POA: Diagnosis not present

## 2018-11-19 DIAGNOSIS — E782 Mixed hyperlipidemia: Secondary | ICD-10-CM | POA: Diagnosis not present

## 2018-11-19 DIAGNOSIS — I5033 Acute on chronic diastolic (congestive) heart failure: Secondary | ICD-10-CM | POA: Diagnosis not present

## 2018-11-19 DIAGNOSIS — H409 Unspecified glaucoma: Secondary | ICD-10-CM | POA: Diagnosis not present

## 2018-11-19 DIAGNOSIS — Z4781 Encounter for orthopedic aftercare following surgical amputation: Secondary | ICD-10-CM | POA: Diagnosis not present

## 2018-11-19 DIAGNOSIS — I998 Other disorder of circulatory system: Secondary | ICD-10-CM | POA: Diagnosis not present

## 2018-11-19 DIAGNOSIS — E1122 Type 2 diabetes mellitus with diabetic chronic kidney disease: Secondary | ICD-10-CM | POA: Diagnosis present

## 2018-11-19 DIAGNOSIS — Z23 Encounter for immunization: Secondary | ICD-10-CM | POA: Diagnosis not present

## 2018-11-19 DIAGNOSIS — Z9841 Cataract extraction status, right eye: Secondary | ICD-10-CM | POA: Diagnosis not present

## 2018-11-19 DIAGNOSIS — R062 Wheezing: Secondary | ICD-10-CM | POA: Diagnosis not present

## 2018-11-19 DIAGNOSIS — R338 Other retention of urine: Secondary | ICD-10-CM | POA: Diagnosis not present

## 2018-11-19 DIAGNOSIS — L97529 Non-pressure chronic ulcer of other part of left foot with unspecified severity: Secondary | ICD-10-CM | POA: Diagnosis not present

## 2018-11-19 DIAGNOSIS — D509 Iron deficiency anemia, unspecified: Secondary | ICD-10-CM | POA: Diagnosis present

## 2018-11-19 DIAGNOSIS — J90 Pleural effusion, not elsewhere classified: Secondary | ICD-10-CM | POA: Diagnosis not present

## 2018-11-19 DIAGNOSIS — I440 Atrioventricular block, first degree: Secondary | ICD-10-CM | POA: Diagnosis present

## 2018-11-19 DIAGNOSIS — I480 Paroxysmal atrial fibrillation: Secondary | ICD-10-CM | POA: Diagnosis not present

## 2018-11-19 DIAGNOSIS — G8918 Other acute postprocedural pain: Secondary | ICD-10-CM | POA: Diagnosis not present

## 2018-11-19 DIAGNOSIS — D649 Anemia, unspecified: Secondary | ICD-10-CM | POA: Diagnosis present

## 2018-11-19 DIAGNOSIS — Z9842 Cataract extraction status, left eye: Secondary | ICD-10-CM | POA: Diagnosis not present

## 2018-11-19 DIAGNOSIS — T8781 Dehiscence of amputation stump: Secondary | ICD-10-CM | POA: Diagnosis not present

## 2018-11-19 DIAGNOSIS — Z8249 Family history of ischemic heart disease and other diseases of the circulatory system: Secondary | ICD-10-CM | POA: Diagnosis not present

## 2018-11-19 DIAGNOSIS — N401 Enlarged prostate with lower urinary tract symptoms: Secondary | ICD-10-CM | POA: Diagnosis not present

## 2018-11-19 DIAGNOSIS — N1832 Chronic kidney disease, stage 3b: Secondary | ICD-10-CM | POA: Diagnosis present

## 2018-11-19 DIAGNOSIS — E039 Hypothyroidism, unspecified: Secondary | ICD-10-CM | POA: Diagnosis present

## 2018-11-19 DIAGNOSIS — I350 Nonrheumatic aortic (valve) stenosis: Secondary | ICD-10-CM | POA: Diagnosis not present

## 2018-11-19 DIAGNOSIS — E119 Type 2 diabetes mellitus without complications: Secondary | ICD-10-CM | POA: Diagnosis not present

## 2018-11-19 DIAGNOSIS — Z993 Dependence on wheelchair: Secondary | ICD-10-CM | POA: Diagnosis not present

## 2018-11-19 DIAGNOSIS — I5032 Chronic diastolic (congestive) heart failure: Secondary | ICD-10-CM | POA: Diagnosis not present

## 2018-11-19 DIAGNOSIS — Z89512 Acquired absence of left leg below knee: Secondary | ICD-10-CM | POA: Diagnosis not present

## 2018-11-19 DIAGNOSIS — E785 Hyperlipidemia, unspecified: Secondary | ICD-10-CM | POA: Diagnosis not present

## 2018-11-19 DIAGNOSIS — D62 Acute posthemorrhagic anemia: Secondary | ICD-10-CM | POA: Diagnosis not present

## 2018-11-19 DIAGNOSIS — N400 Enlarged prostate without lower urinary tract symptoms: Secondary | ICD-10-CM | POA: Diagnosis present

## 2018-11-19 DIAGNOSIS — I130 Hypertensive heart and chronic kidney disease with heart failure and stage 1 through stage 4 chronic kidney disease, or unspecified chronic kidney disease: Secondary | ICD-10-CM | POA: Diagnosis not present

## 2018-11-19 DIAGNOSIS — E871 Hypo-osmolality and hyponatremia: Secondary | ICD-10-CM | POA: Diagnosis not present

## 2018-11-19 DIAGNOSIS — Z87891 Personal history of nicotine dependence: Secondary | ICD-10-CM | POA: Diagnosis not present

## 2018-11-19 DIAGNOSIS — I739 Peripheral vascular disease, unspecified: Secondary | ICD-10-CM | POA: Diagnosis not present

## 2018-11-19 DIAGNOSIS — Z959 Presence of cardiac and vascular implant and graft, unspecified: Secondary | ICD-10-CM | POA: Diagnosis not present

## 2018-11-19 DIAGNOSIS — N179 Acute kidney failure, unspecified: Secondary | ICD-10-CM | POA: Diagnosis not present

## 2018-11-19 DIAGNOSIS — R52 Pain, unspecified: Secondary | ICD-10-CM | POA: Diagnosis not present

## 2018-11-19 DIAGNOSIS — M1990 Unspecified osteoarthritis, unspecified site: Secondary | ICD-10-CM | POA: Diagnosis not present

## 2018-11-19 DIAGNOSIS — R8290 Unspecified abnormal findings in urine: Secondary | ICD-10-CM | POA: Diagnosis not present

## 2018-11-19 DIAGNOSIS — I10 Essential (primary) hypertension: Secondary | ICD-10-CM | POA: Diagnosis not present

## 2018-11-19 DIAGNOSIS — D638 Anemia in other chronic diseases classified elsewhere: Secondary | ICD-10-CM | POA: Diagnosis present

## 2018-11-19 DIAGNOSIS — Z89432 Acquired absence of left foot: Secondary | ICD-10-CM | POA: Diagnosis not present

## 2018-11-19 LAB — COMPREHENSIVE METABOLIC PANEL
ALT: 22 U/L (ref 0–44)
AST: 28 U/L (ref 15–41)
Albumin: 3 g/dL — ABNORMAL LOW (ref 3.5–5.0)
Alkaline Phosphatase: 41 U/L (ref 38–126)
Anion gap: 10 (ref 5–15)
BUN: 31 mg/dL — ABNORMAL HIGH (ref 8–23)
CO2: 26 mmol/L (ref 22–32)
Calcium: 8.7 mg/dL — ABNORMAL LOW (ref 8.9–10.3)
Chloride: 96 mmol/L — ABNORMAL LOW (ref 98–111)
Creatinine, Ser: 1.38 mg/dL — ABNORMAL HIGH (ref 0.61–1.24)
GFR calc Af Amer: 52 mL/min — ABNORMAL LOW (ref >60)
GFR calc non Af Amer: 45 mL/min — ABNORMAL LOW (ref >60)
Glucose, Bld: 145 mg/dL — ABNORMAL HIGH (ref 70–99)
Potassium: 3.5 mmol/L (ref 3.5–5.1)
Sodium: 132 mmol/L — ABNORMAL LOW (ref 135–145)
Total Bilirubin: 0.6 mg/dL (ref 0.3–1.2)
Total Protein: 6.5 g/dL (ref 6.5–8.1)

## 2018-11-19 LAB — CBG MONITORING, ED
Glucose-Capillary: 133 mg/dL — ABNORMAL HIGH (ref 70–99)
Glucose-Capillary: 132 mg/dL — ABNORMAL HIGH (ref 70–99)
Glucose-Capillary: 92 mg/dL (ref 70–99)

## 2018-11-19 LAB — BRAIN NATRIURETIC PEPTIDE: B Natriuretic Peptide: 213.1 pg/mL — ABNORMAL HIGH (ref 0.0–100.0)

## 2018-11-19 LAB — SARS CORONAVIRUS 2 (TAT 6-24 HRS): SARS Coronavirus 2: NEGATIVE

## 2018-11-19 LAB — GLUCOSE, CAPILLARY
Glucose-Capillary: 138 mg/dL — ABNORMAL HIGH (ref 70–99)
Glucose-Capillary: 153 mg/dL — ABNORMAL HIGH (ref 70–99)
Glucose-Capillary: 98 mg/dL (ref 70–99)

## 2018-11-19 LAB — SURGICAL PCR SCREEN
MRSA, PCR: NEGATIVE
Staphylococcus aureus: NEGATIVE

## 2018-11-19 LAB — LACTIC ACID, PLASMA: Lactic Acid, Venous: 1.1 mmol/L (ref 0.5–1.9)

## 2018-11-19 MED ORDER — ONDANSETRON HCL 4 MG/2ML IJ SOLN: 4.0000 mg | Freq: Four times a day (QID) | INTRAMUSCULAR | Status: DC | PRN

## 2018-11-19 MED ORDER — VANCOMYCIN HCL IN DEXTROSE 1-5 GM/200ML-% IV SOLN
1000.0000 mg | INTRAVENOUS | Status: DC
  Administered 2018-11-19: 1000 mg via INTRAVENOUS
  Filled 2018-11-19: qty 200

## 2018-11-19 MED ORDER — INSULIN ASPART 100 UNIT/ML ~~LOC~~ SOLN
0.0000 [IU] | SUBCUTANEOUS | Status: DC
  Administered 2018-11-19: 1 [IU] via SUBCUTANEOUS
  Administered 2018-11-19: 2 [IU] via SUBCUTANEOUS
  Administered 2018-11-19 – 2018-11-20 (×2): 1 [IU] via SUBCUTANEOUS
  Administered 2018-11-20 – 2018-11-21 (×2): 2 [IU] via SUBCUTANEOUS
  Administered 2018-11-21 (×2): 1 [IU] via SUBCUTANEOUS
  Administered 2018-11-21 – 2018-11-22 (×2): 2 [IU] via SUBCUTANEOUS
  Administered 2018-11-22: 3 [IU] via SUBCUTANEOUS
  Administered 2018-11-22: 1 [IU] via SUBCUTANEOUS
  Administered 2018-11-23 – 2018-11-24 (×3): 3 [IU] via SUBCUTANEOUS
  Administered 2018-11-24 (×2): 2 [IU] via SUBCUTANEOUS
  Administered 2018-11-24: 3 [IU] via SUBCUTANEOUS
  Administered 2018-11-24: 2 [IU] via SUBCUTANEOUS
  Administered 2018-11-24: 5 [IU] via SUBCUTANEOUS
  Administered 2018-11-25: 2 [IU] via SUBCUTANEOUS
  Administered 2018-11-25: 5 [IU] via SUBCUTANEOUS
  Administered 2018-11-25: 3 [IU] via SUBCUTANEOUS
  Administered 2018-11-26: 5 [IU] via SUBCUTANEOUS
  Administered 2018-11-26 (×2): 2 [IU] via SUBCUTANEOUS
  Administered 2018-11-26: 1 [IU] via SUBCUTANEOUS
  Administered 2018-11-26: 2 [IU] via SUBCUTANEOUS
  Administered 2018-11-26: 1 [IU] via SUBCUTANEOUS
  Administered 2018-11-27: 2 [IU] via SUBCUTANEOUS
  Administered 2018-11-27 (×2): 1 [IU] via SUBCUTANEOUS
  Filled 2018-11-19: qty 0.09

## 2018-11-19 MED ORDER — POTASSIUM CHLORIDE CRYS ER 10 MEQ PO TBCR
10.0000 meq | EXTENDED_RELEASE_TABLET | Freq: Every day | ORAL | Status: DC
  Administered 2018-11-19 – 2018-12-09 (×21): 10 meq via ORAL
  Filled 2018-11-19 (×21): qty 1

## 2018-11-19 MED ORDER — SODIUM CHLORIDE 0.9 % IV SOLN
INTRAVENOUS | Status: AC
  Administered 2018-11-19: 04:00:00 via INTRAVENOUS

## 2018-11-19 MED ORDER — PIPERACILLIN-TAZOBACTAM 3.375 G IVPB
3.3750 g | Freq: Three times a day (TID) | INTRAVENOUS | Status: DC
  Administered 2018-11-19 – 2018-11-24 (×15): 3.375 g via INTRAVENOUS
  Filled 2018-11-19 (×17): qty 50

## 2018-11-19 MED ORDER — SODIUM CHLORIDE 0.9% FLUSH
3.0000 mL | Freq: Two times a day (BID) | INTRAVENOUS | Status: DC
  Administered 2018-11-20 – 2018-12-03 (×5): 3 mL via INTRAVENOUS

## 2018-11-19 MED ORDER — LEVOTHYROXINE SODIUM 50 MCG PO TABS
50.0000 ug | ORAL_TABLET | Freq: Every day | ORAL | Status: DC
  Administered 2018-11-19 – 2018-12-09 (×21): 50 ug via ORAL
  Filled 2018-11-19 (×22): qty 1

## 2018-11-19 MED ORDER — FAMOTIDINE 20 MG PO TABS
20.0000 mg | ORAL_TABLET | Freq: Every day | ORAL | Status: DC
  Administered 2018-11-19 – 2018-12-09 (×21): 20 mg via ORAL
  Filled 2018-11-19 (×22): qty 1

## 2018-11-19 MED ORDER — FINASTERIDE 5 MG PO TABS
5.0000 mg | ORAL_TABLET | Freq: Every evening | ORAL | Status: DC
  Administered 2018-11-19 – 2018-12-08 (×20): 5 mg via ORAL
  Filled 2018-11-19 (×21): qty 1

## 2018-11-19 MED ORDER — ASPIRIN EC 81 MG PO TBEC
81.0000 mg | DELAYED_RELEASE_TABLET | Freq: Every day | ORAL | Status: DC
  Administered 2018-11-19 – 2018-11-23 (×5): 81 mg via ORAL
  Filled 2018-11-19 (×5): qty 1

## 2018-11-19 MED ORDER — ACETAMINOPHEN 650 MG RE SUPP: 650.0000 mg | Freq: Four times a day (QID) | RECTAL | Status: DC | PRN

## 2018-11-19 MED ORDER — ATORVASTATIN CALCIUM 40 MG PO TABS
40.0000 mg | ORAL_TABLET | Freq: Every day | ORAL | Status: DC
  Administered 2018-11-19 – 2018-12-09 (×21): 40 mg via ORAL
  Filled 2018-11-19 (×21): qty 1

## 2018-11-19 MED ORDER — ACETAMINOPHEN 325 MG PO TABS
650.0000 mg | ORAL_TABLET | Freq: Four times a day (QID) | ORAL | Status: DC | PRN
  Administered 2018-11-23: 650 mg via ORAL

## 2018-11-19 MED ORDER — TAMSULOSIN HCL 0.4 MG PO CAPS
0.4000 mg | ORAL_CAPSULE | Freq: Two times a day (BID) | ORAL | Status: DC
  Administered 2018-11-19 – 2018-12-09 (×40): 0.4 mg via ORAL
  Filled 2018-11-19 (×40): qty 1

## 2018-11-19 MED ORDER — DORZOLAMIDE HCL-TIMOLOL MAL 2-0.5 % OP SOLN
1.0000 [drp] | Freq: Two times a day (BID) | OPHTHALMIC | Status: DC
  Administered 2018-11-19 – 2018-12-09 (×39): 1 [drp] via OPHTHALMIC
  Filled 2018-11-19 (×3): qty 10

## 2018-11-19 MED ORDER — HEPARIN (PORCINE) 25000 UT/250ML-% IV SOLN
950.0000 [IU]/h | INTRAVENOUS | Status: DC
  Administered 2018-11-20: 1200 [IU]/h via INTRAVENOUS
  Administered 2018-11-20: 1100 [IU]/h via INTRAVENOUS
  Filled 2018-11-19 (×3): qty 250

## 2018-11-19 MED ORDER — APIXABAN 2.5 MG PO TABS
2.5000 mg | ORAL_TABLET | Freq: Two times a day (BID) | ORAL | Status: DC
  Administered 2018-11-19: 2.5 mg via ORAL
  Filled 2018-11-19 (×2): qty 1

## 2018-11-19 MED ORDER — FUROSEMIDE 40 MG PO TABS
40.0000 mg | ORAL_TABLET | Freq: Two times a day (BID) | ORAL | Status: DC
  Administered 2018-11-19 – 2018-11-20 (×4): 40 mg via ORAL
  Filled 2018-11-19 (×4): qty 1

## 2018-11-19 NOTE — ED Notes (Signed)
ED TO INPATIENT HANDOFF REPORT  Name/Age/Gender [name] 85 y.o. male  Code Status    Code Status Orders  (From admission, onward)         Start     Ordered   11/19/18 0144  Do not attempt resuscitation (DNR)  Continuous    Question Answer Comment  In the event of cardiac or respiratory ARREST Do not call a "code blue"   In the event of cardiac or respiratory ARREST Do not perform Intubation, CPR, defibrillation or ACLS   In the event of cardiac or respiratory ARREST Use medication by any route, position, wound care, and other measures to relive pain and suffering. May use oxygen, suction and manual treatment of airway obstruction as needed for comfort.      11/19/18 0144        Code Status History    Date Active Date Inactive Code Status Order ID Comments User Context   11/09/2018 1848 11/16/2018 1930 DNR [id number]  Orson Eva, MD Inpatient   09/07/2018 1538 09/22/2018 1419 Full Code [id number]  Cathlyn Parsons, PA-C Inpatient   09/07/2018 1537 09/07/2018 1538 Full Code [id number]  Cathlyn Parsons, PA-C Inpatient   08/21/2018 0022 09/07/2018 1524 Full Code [id number]  Rise Patience, MD Inpatient   08/03/2018 1532 08/04/2018 [place] Full Code [id number]  Lorretta Harp, MD Inpatient   07/27/2018 1021 07/27/2018 1611 Full Code [id number]  Lorretta Harp, MD Inpatient   05/29/2018 1029 06/01/2018 1928 Full Code [id number]  Barton Dubois, MD ED   07/25/2017 1816 07/26/2017 1812 Full Code [id number]  Barton Dubois, MD Inpatient   Advance Care Planning Activity    Advance Directive Documentation     Most Recent Value  Type of Advance Directive  Healthcare Power of Attorney  Pre-existing out of facility DNR order (yellow form or pink MOST form)  -  "MOST" Form in Place?  -      Home/SNF/Other Home  Chief Complaint Toe infection   Level of Care/Admitting Diagnosis ED Disposition    ED Disposition Condition [place]: [place]  [100100]  Level of Care: Telemetry Medical [104]  Covid Evaluation: Asymptomatic Screening Protocol (No Symptoms)  Diagnosis: Toe infection [824235]  Admitting Physician: Jani Gravel [[id number]]  Attending Physician: Jani Gravel [phone]  Estimated length of stay: past midnight tomorrow  Certification:: I certify this patient will need inpatient services for at least 2 midnights  PT Class (Do Not Modify): Inpatient [101]  PT Acc Code (Do Not Modify): Private [1]       Medical History Past Medical History:  Diagnosis Date  . Arthritis   . Borderline diabetic   . Glaucoma   . Hyperlipidemia   . Hypertension   . PVD (peripheral vascular disease) ([place])   . Sleep apnea    Cpap ordered but doesnt use    Allergies Allergies  Allergen Reactions  . Iodinated Diagnostic Agents     Other reaction(s): Fever  . Dye Fdc Red [Red Dye] Hives  . Iodine-131 Other (See Comments)    Breakout, fever  . Iohexol      Code: RASH, Desc: PT STATES HOT ALL OVER HAD TO HAVE ICE PACKS ALL OVER HIM AND DIFF BREATHING, PT REFUSED IV DYE     IV Location/Drains/Wounds Patient Lines/Drains/Airways Status   Active Line/Drains/Airways    Name:   Placement date:   Placement time:   Site:   Days:   Peripheral IV  11/18/18 Right Antecubital   11/18/18    2219    Antecubital   1   Incision (Closed) 08/28/18 Leg Right;Lower;Other (Comment)   08/28/18    0753     83   Pressure Injury 09/10/18 Heel Left Stage II -  Partial thickness loss of dermis presenting as a shallow open ulcer with a red, pink wound bed without slough. was charted on 09/10/18 as a stage 1 under other wound column, now is a stage 2/open blister   09/10/18    1718     70   Wound / Incision (Open or Dehisced) 11/09/18 Diabetic ulcer Foot Left;Lateral   11/09/18    2115    Foot   10   Wound / Incision (Open or Dehisced) 11/09/18 Diabetic ulcer Toe (Comment  which one) Left Left Great Toe   11/09/18    2116    Toe (Comment  which one)   10           Labs/Imaging Results for orders placed or performed during the hospital encounter of 11/18/18 (from the past 48 hour(s))  Urinalysis, Routine w reflex microscopic     Status: Abnormal   Collection Time: 11/18/18  8:51 PM  Result Value Ref Range   Color, Urine YELLOW YELLOW   APPearance CLEAR CLEAR   Specific Gravity, Urine 1.008 1.005 - 1.030   pH 8.0 5.0 - 8.0   Glucose, UA NEGATIVE NEGATIVE mg/dL   Hgb urine dipstick NEGATIVE NEGATIVE   Bilirubin Urine NEGATIVE NEGATIVE   Ketones, ur NEGATIVE NEGATIVE mg/dL   Protein, ur NEGATIVE NEGATIVE mg/dL   Nitrite NEGATIVE NEGATIVE   Leukocytes,Ua TRACE (A) NEGATIVE   RBC / HPF 0-5 0 - 5 RBC/hpf   WBC, UA 0-5 0 - 5 WBC/hpf   Bacteria, UA RARE (A) NONE SEEN   Squamous Epithelial / LPF 0-5 0 - 5   Budding Yeast PRESENT     Comment: Performed at [facility], [place] [address], [place], [place] [address]  Blood culture (routine x 2)     Status: None (Preliminary result)   Collection Time: 11/18/18  9:07 PM   Specimen: BLOOD RIGHT ARM  Result Value Ref Range   Specimen Description      BLOOD RIGHT ARM Performed at [facility], [place] [address] [place], [place] [address]    Special Requests      BOTTLES DRAWN AEROBIC ONLY Blood Culture adequate volume Performed at [place] [address] [place], [place] [address]    Culture PENDING    Report Status PENDING   Comprehensive metabolic panel     Status: Abnormal   Collection Time: 11/18/18  9:23 PM  Result Value Ref Range   Sodium 132 (L) 135 - 145 mmol/L   Potassium 3.8 3.5 - 5.1 mmol/L   Chloride 96 (L) 98 - 111 mmol/L   CO2 23 22 - 32 mmol/L   Glucose, Bld 111 (H) 70 - 99 mg/dL   BUN 31 (H) 8 - 23 mg/dL   Creatinine, Ser 1.27 (H) 0.61 - 1.24 mg/dL   Calcium 8.8 (L) 8.9 - 10.3 mg/dL   Total Protein 7.1 6.5 - 8.1 g/dL   Albumin 3.3 (L) 3.5 - 5.0 g/dL   AST 33 15 - 41 U/L   ALT 25 0 - 44 U/L   Alkaline Phosphatase 47 38 - 126 U/L    Total Bilirubin 0.7 0.3 - 1.2 mg/dL   GFR calc  non Af Amer 50 (L) >60 mL/min   GFR calc Af Amer 58 (L) >60 mL/min   Anion gap 13 5 - 15    Comment: Performed at [facility], [place] [address], [place], [place] [address]  CBC with Differential/Platelet     Status: Abnormal   Collection Time: 11/18/18  9:32 PM  Result Value Ref Range   WBC 10.8 (H) 4.0 - 10.5 K/uL   RBC 3.77 (L) 4.22 - 5.81 MIL/uL   Hemoglobin 11.5 (L) 13.0 - 17.0 g/dL   HCT 34.9 (L) 39.0 - 52.0 %   MCV 92.6 80.0 - 100.0 fL   MCH 30.5 26.0 - 34.0 pg   MCHC 33.0 30.0 - 36.0 g/dL   RDW 14.4 11.5 - 15.5 %   Platelets 310 150 - 400 K/uL   nRBC 0.0 0.0 - 0.2 %   Neutrophils Relative % 51 %   Neutro Abs 5.6 1.7 - 7.7 K/uL   Lymphocytes Relative 33 %   Lymphs Abs 3.6 0.7 - 4.0 K/uL   Monocytes Relative 9 %   Monocytes Absolute 0.9 0.1 - 1.0 K/uL   Eosinophils Relative 6 %   Eosinophils Absolute 0.7 (H) 0.0 - 0.5 K/uL   Basophils Relative 1 %   Basophils Absolute 0.1 0.0 - 0.1 K/uL   Immature Granulocytes 0 %   Abs Immature Granulocytes 0.03 0.00 - 0.07 K/uL    Comment: Performed at [facility], [place] [address], [place], [place] [address]  Brain natriuretic peptide     Status: Abnormal   Collection Time: 11/18/18  9:32 PM  Result Value Ref Range   B Natriuretic Peptide 213.1 (H) 0.0 - 100.0 pg/mL    Comment: Performed at [facility], [place] [address], [place], [place] [address]  SARS CORONAVIRUS 2 (TAT 6-24 HRS) Nasopharyngeal Nasopharyngeal Swab     Status: None   Collection Time: 11/18/18 10:04 PM   Specimen: Nasopharyngeal Swab  Result Value Ref Range   SARS Coronavirus 2 NEGATIVE NEGATIVE    Comment: (NOTE) SARS-CoV-2 target nucleic acids are NOT DETECTED. The SARS-CoV-2 RNA is generally detectable in upper and lower respiratory specimens during the acute phase of infection. Negative results do not preclude SARS-CoV-2 infection, do not rule  out co-infections with other pathogens, and should not be used as the sole basis for treatment or other patient management decisions. Negative results must be combined with clinical observations, patient history, and epidemiological information. The expected result is Negative. Fact Sheet for Patients: SugarRoll.be Fact Sheet for Healthcare Providers: https://www.woods-mathews.com/ This test is not yet approved or cleared by the [place] FDA and  has been authorized for detection and/or diagnosis of SARS-CoV-2 by FDA under an Emergency Use Authorization (EUA). This EUA will remain  in effect (meaning this test can be used) for the duration of the COVID-19 declaration under Section 56 4(b)(1) of the Act, 21 U.S.C. section 360bbb-3(b)(1), unless the authorization is terminated or revoked sooner. Performed at [facility], [place] [address], [place], [place] [address]   Lactic acid, plasma     Status: None   Collection Time: 11/18/18 10:09 PM  Result Value Ref Range   Lactic Acid, Venous 1.1 0.5 - 1.9 mmol/L    Comment: Performed at [facility], [place] [address] [place], [place] [address]  Sedimentation rate     Status: Abnormal   Collection Time: 11/18/18 10:09 PM  Result Value Ref Range   Sed Rate  84 (H) 0 - 16 mm/hr    Comment: Performed at [facility], [place] [address], [place], [place] [address]  C-reactive protein     Status: Abnormal   Collection Time: 11/18/18 10:09 PM  Result Value Ref Range   CRP 5.1 (H) <1.0 mg/dL    Comment: Performed at [facility], [place] [address], [place], [place] [address]  Lactic acid, plasma     Status: None   Collection Time: 11/19/18 12:23 AM  Result Value Ref Range   Lactic Acid, Venous 1.1 0.5 - 1.9 mmol/L    Comment: Performed at [facility], [place] [address], [place], [place] [address]  CBG monitoring, ED     Status:  Abnormal   Collection Time: 11/19/18  2:35 AM  Result Value Ref Range   Glucose-Capillary 133 (H) 70 - 99 mg/dL  CBG monitoring, ED     Status: Abnormal   Collection Time: 11/19/18  3:53 AM  Result Value Ref Range   Glucose-Capillary 132 (H) 70 - 99 mg/dL  Comprehensive metabolic panel     Status: Abnormal   Collection Time: 11/19/18  4:17 AM  Result Value Ref Range   Sodium 132 (L) 135 - 145 mmol/L   Potassium 3.5 3.5 - 5.1 mmol/L   Chloride 96 (L) 98 - 111 mmol/L   CO2 26 22 - 32 mmol/L   Glucose, Bld 145 (H) 70 - 99 mg/dL   BUN 31 (H) 8 - 23 mg/dL   Creatinine, Ser 1.38 (H) 0.61 - 1.24 mg/dL   Calcium 8.7 (L) 8.9 - 10.3 mg/dL   Total Protein 6.5 6.5 - 8.1 g/dL   Albumin 3.0 (L) 3.5 - 5.0 g/dL   AST 28 15 - 41 U/L   ALT 22 0 - 44 U/L   Alkaline Phosphatase 41 38 - 126 U/L   Total Bilirubin 0.6 0.3 - 1.2 mg/dL   GFR calc non Af Amer 45 (L) >60 mL/min   GFR calc Af Amer 52 (L) >60 mL/min   Anion gap 10 5 - 15    Comment: Performed at [facility], [place] [address], [place], [place] [address]  CBG monitoring, ED     Status: None   Collection Time: 11/19/18 11:06 AM  Result Value Ref Range   Glucose-Capillary 92 70 - 99 mg/dL   Dg Chest 2 View  Result Date: 11/18/2018 CLINICAL DATA:  85 y.o male. Hx CHF, HTN. Recent admission for same. EXAM: CHEST - 2 VIEW COMPARISON:  Chest radiograph 11/13/2018 FINDINGS: Stable cardiomediastinal contours with enlarged heart size. Central vascular congestion. Hazy bibasilar pulmonary opacities likely reflecting combination of atelectasis and pleural fluid, superimposed infection not excluded. Moderate bilateral pleural effusions. No pneumothorax. No acute finding in the visualized skeleton. IMPRESSION: 1. Cardiomegaly with central vascular congestion. 2. Hazy bibasilar pulmonary opacities likely reflecting a combination of atelectasis and pleural fluid, superimposed infection not excluded. Moderate bilateral pleural effusions.  Electronically Signed   By: Audie Pinto M.D.   On: 11/18/2018 19:44   Dg Foot 2 Views Left  Result Date: 11/18/2018 CLINICAL DATA:  85 y.o male. Black color to greater toe, redness and swelling. EXAM: LEFT FOOT - 2 VIEW COMPARISON:  None. FINDINGS: There is no evidence of fracture or dislocation. No evidence of bony destruction. Moderate scattered degenerative changes. Extensive vascular calcification. IMPRESSION: No acute osseous abnormality in the left foot. Electronically Signed   By: Audie Pinto M.D.   On: 11/18/2018 19:42  Pending Labs Unresulted Labs (From admission, onward)    Start     Ordered   11/18/18 2128  Blood culture (routine x 2)  BLOOD CULTURE X 2,   STAT     11/18/18 2131   11/18/18 1817  CBC with Differential  ONCE - STAT,   STAT     11/18/18 1818          Vitals/Pain Today's Vitals   11/19/18 0700 11/19/18 0749 11/19/18 1000 11/19/18 1100  BP: (!) 129/51   (!) 143/78  Pulse: 62   71  Resp: 13  15 16   Temp:      TempSrc:      SpO2: 98%   98%  PainSc:  Asleep      Isolation Precautions No active isolations  Medications Medications  aspirin EC tablet 81 mg (81 mg Oral Given 11/19/18 1054)  atorvastatin (LIPITOR) tablet 40 mg (40 mg Oral Given 11/19/18 1055)  furosemide (LASIX) tablet 40 mg (40 mg Oral Given 11/19/18 1054)  levothyroxine (SYNTHROID) tablet 50 mcg (50 mcg Oral Given 11/19/18 1055)  finasteride (PROSCAR) tablet 5 mg (has no administration in time range)  famotidine (PEPCID) tablet 20 mg (20 mg Oral Given 11/19/18 1054)  apixaban (ELIQUIS) tablet 2.5 mg (2.5 mg Oral Given 11/19/18 1055)  potassium chloride (KLOR-CON) CR tablet 10 mEq (10 mEq Oral Given 11/19/18 1054)  dorzolamide-timolol (COSOPT) 22.3-6.8 MG/ML ophthalmic solution 1 drop (1 drop Both Eyes Given 11/19/18 1055)  tamsulosin (FLOMAX) capsule 0.4 mg (0.4 mg Oral Given 11/19/18 1107)  insulin aspart (novoLOG) injection 0-9 Units (0 Units Subcutaneous Hold 11/19/18 1100)   ondansetron (ZOFRAN) injection 4 mg (has no administration in time range)  0.9 %  sodium chloride infusion ( Intravenous Stopped 11/19/18 1032)  acetaminophen (TYLENOL) tablet 650 mg (has no administration in time range)    Or  acetaminophen (TYLENOL) suppository 650 mg (has no administration in time range)  piperacillin-tazobactam (ZOSYN) IVPB 3.375 g (3.375 g Intravenous New Bag/Given 11/19/18 0613)  vancomycin (VANCOCIN) IVPB 1000 mg/200 mL premix (has no administration in time range)  sodium chloride flush (NS) 0.9 % injection 3 mL (3 mLs Intravenous Given 11/18/18 2334)  vancomycin (VANCOCIN) 2,000 mg in sodium chloride 0.9 % 500 mL IVPB (0 mg Intravenous Stopped 11/19/18 0133)  piperacillin-tazobactam (ZOSYN) IVPB 3.375 g (0 g Intravenous Stopped 11/18/18 2320)    Mobility non-ambulatory

## 2018-11-19 NOTE — Plan of Care (Signed)

## 2018-11-19 NOTE — Progress Notes (Signed)
Pharmacy Antibiotic Note  [name] is a 85 y.o. male admitted on 11/18/2018 with Wound infection.  Pharmacy has been consulted for Vancomycin and Zosyn dosing.  Plan: Vancomycin 2gm iv x1, then Vancomycin 1000 mg IV Q 24 hrs. Goal AUC 400-550. Expected AUC: 486 SCr used: 1.27  Vd: 0.5 (adjusted BMI > 30)  Zosyn 3.375gm iv x1, then Zosyn 3.375g IV Q8H      Temp (24hrs), Avg:98.4 F (36.9 C), Min:98.2 F (36.8 C), Max:98.6 F (37 C)  Recent Labs  Lab 11/13/18 0649 11/14/18 0639 11/15/18 0627 11/16/18 0504 11/18/18 2123 11/18/18 2132 11/18/18 2209 11/19/18 0023  WBC  --   --   --  9.2  --  10.8*  --   --   CREATININE 1.29* 1.40* 1.33* 1.45* 1.27*  --   --   --   LATICACIDVEN  --   --   --   --   --   --  1.1 1.1    Estimated Creatinine Clearance: 48.6 mL/min (A) (by C-G formula based on SCr of 1.27 mg/dL (H)).    Allergies  Allergen Reactions  . Iodinated Diagnostic Agents     Other reaction(s): Fever  . Dye Fdc Red [Red Dye] Hives  . Iodine-131 Other (See Comments)    Breakout, fever  . Iohexol      Code: RASH, Desc: PT STATES HOT ALL OVER HAD TO HAVE ICE PACKS ALL OVER HIM AND DIFF BREATHING, PT REFUSED IV DYE     Antimicrobials this admission: Vancomycin 11/18/2018 >> Zosyn 11/18/2018 >>   Dose adjustments this admission: -  Microbiology results: - Thank you for allowing pharmacy to be a part of this patient's care.  Nani Skillern Crowford 11/19/2018 4:47 AM

## 2018-11-19 NOTE — Consult Note (Addendum)
Cardiology Consultation:   Patient ID: [name] MRN: [id number]; DOB: 1927-03-24  Admit date: 11/18/2018 Date of Consult: 11/19/2018  Primary Care Provider: Redmond School, MD Primary Cardiologist: Pixie Casino, MD  Primary Electrophysiologist:  None  PV Dr. Gwenlyn Found   Patient Profile:   [name] is a 85 y.o. male with a hx of HTN, HLD, OSA and PVD with stent to Lt SFA 05/2010 and after infection of Rt Gt toe with multiple rounds of ABX Dopplers with ABI of 0.42 on the Rt and follow up PV angio but eventual Rt BKA 08/2018 who is being seen today for the evaluation of lt ischemic changes of Lt foot increased at the request of Dr. Sharol Given.  History of Present Illness:   [name] with a hx of HTN, HLD, OSA and PVD with stent to Lt SFA 05/2010 and after infection of Rt Gt toe with multiple rounds of ABX Dopplers with ABI of 0.42 on the Rt and follow up PV angio and distal Rt SFA/popliteal intervention distal right SFA/popliteal intervention using orbital atherectomy and drug-coated balloon angioplasty on 08/03/2018 which was successful. He did have 0 vessel runoff however the wound continued to deteriorate he had staged right SFA intervention by Dr. Fletcher Anon 08/25/2018 with tibial pedal access and recanalization of an occluded anterior tibial, but eventual Rt BKA 08/2018.   In Oct he had a small area of eschar on tip of his Lt great toe and a small wound on the dorsal aspect of his left foot.  In July 2020 he had a patent stent in his mild Lt SFA  90% segmental calcified disease just beyond that was 0 vessel runoff.    Neg nuc in 2012  Hx of AS EF of 60-65%, there was mild to moderate aortic stenosis with a mean gradient of 15 mmHg and calculated valve area of 1.3-1.4 cm in 2018 and in 2019  This demonstrates moderate aortic stenosis with a mean gradient of 26 mmHg, from 21 mmHg previously.  In 08/2018 Severe calcifcation of the aortic valve. Moderate stenosis of the aortic valve. Mean gradient 25  mmHg. EF 55-60%.   There is mild to moderate dilatation of the ascending aorta measuring 43 mm.  He was hospitalized 11/12/18 with acute on chronic CHF>  Diuresed and EF 55-60%   He was neg 19L   He has PAF on Eliquis for anticoagulation, CKD-3 and DM-2 , HLD, Hypothyroidism   Pt presented to ER yesterday with worsening ischemic ulcer to Lt great toe with cellulitis of the left forefoot.  Pt now on IV vanc and zosyn.   Dr. Sharol Given has seen and requested Dr. Kennon Holter input BP 149/87 P 60s   Na 132, K+ 3.5 Cr 1.38, BUN 31, BNP 213 WBC 10.8, Hgb 11.5 plts 310  Sed reate 84 COVID neg  2 V CXR MPRESSION: 1. Cardiomegaly with central vascular congestion. 2. Hazy bibasilar pulmonary opacities likely reflecting a combination of atelectasis and pleural fluid, superimposed infection not excluded. Moderate bilateral pleural effusions.  2V lt foot IMPRESSION: No acute osseous abnormality in the left foot.  EKG:  The EKG was personally reviewed and demonstrates:  Will order  Telemetry:  Telemetry was personally reviewed and demonstrates:  Not yet placed.    Just arrived to [facility] from [facility].  No chest pain and no SOB, no Lt foot pain.  No syncope, no lightheadedness.   Heart Pathway Score:     Past Medical History:  Diagnosis Date  Arthritis    Borderline diabetic    Glaucoma    Hyperlipidemia    Hypertension    PVD (peripheral vascular disease) ([place])    Sleep apnea    Cpap ordered but doesnt use    Past Surgical History:  Procedure Laterality Date   ABDOMINAL AORTOGRAM W/LOWER EXTREMITY N/A 07/27/2018   Procedure: ABDOMINAL AORTOGRAM W/LOWER EXTREMITY;  Surgeon: Lorretta Harp, MD;  Location: [place] [facility];  Service: Cardiovascular;  Laterality: N/A;   AMPUTATION Right 08/28/2018   Procedure: RIGHT BELOW KNEE AMPUTATION;  Surgeon: [name], MD;  Location: [place];  Service: Orthopedics;  Laterality: Right;   APPENDECTOMY  1943   CATARACT EXTRACTION  2012   x2    COLONOSCOPY N/A 11/01/2014   Procedure: COLONOSCOPY;  Surgeon: Aviva Signs, MD;  [facility];  Service: Gastroenterology;  Laterality: N/A;   ESOPHAGOGASTRODUODENOSCOPY N/A 11/01/2014   Procedure: ESOPHAGOGASTRODUODENOSCOPY (EGD);  Surgeon: Aviva Signs, MD;  [facility];  Service: Gastroenterology;  Laterality: N/A;   HERNIA REPAIR Left    INGUINAL HERNIA REPAIR Right 03/01/2013   Procedure: RIGHT INGUINAL HERNIORRHAPHY;  Surgeon: Jamesetta So, MD;  Location: [facility];  Service: General;  Laterality: Right;   INSERTION OF MESH Right 03/01/2013   Procedure: INSERTION OF MESH;  Surgeon: Jamesetta So, MD;  Location: [facility];  Service: General;  Laterality: Right;   LOWER EXTREMITY ANGIOGRAPHY Right 08/25/2018   Procedure: LOWER EXTREMITY ANGIOGRAPHY;  Surgeon: Wellington Hampshire, MD;  Location: [place] [facility];  Service: Cardiovascular;  Laterality: Right;   NM MYOCAR PERF WALL MOTION  06/05/2010   no significant ischemia   PERIPHERAL VASCULAR ATHERECTOMY Right 08/03/2018   Procedure: PERIPHERAL VASCULAR ATHERECTOMY;  Surgeon: Lorretta Harp, MD;  Location: [place] [facility];  Service: Cardiovascular;  Laterality: Right;   stent  06/14/2010   left leg   US ECHOCARDIOGRAPHY  01/21/2006   moderate mitral annular ca+, mild MR, AOV moderately sclerotic.     Home Medications:  Prior to Admission medications   Medication Sig Start Date End Date Taking? Authorizing Provider  acetaminophen (TYLENOL) 325 MG tablet Take 1-2 tablets (325-650 mg total) by mouth every 6 (six) hours as needed for mild pain (pain score 1-3 or temp > 100.5). 09/22/18  Yes Angiulli, Lavon Paganini, PA-C  apixaban (ELIQUIS) 2.5 MG TABS tablet Take 1 tablet (2.5 mg total) by mouth 2 (two) times daily. 10/09/18  Yes Lorretta Harp, MD  aspirin EC 81 MG tablet Take 81 mg by mouth daily.   Yes [provider]  atorvastatin (LIPITOR) 40 MG tablet Take 1 tablet (40 mg total) by mouth daily.  09/22/18  Yes Angiulli, Lavon Paganini, PA-C  dorzolamide-timolol (COSOPT) 22.3-6.8 MG/ML ophthalmic solution Place 1 drop into both eyes 2 (two) times daily.   Yes [provider]  doxazosin (CARDURA) 2 MG tablet Take 1 tablet (2 mg total) by mouth at bedtime. 09/22/18  Yes Angiulli, Lavon Paganini, PA-C  famotidine (PEPCID) 20 MG tablet Take 1 tablet (20 mg total) by mouth daily. 11/12/18  Yes Johnson, Clanford L, MD  finasteride (PROSCAR) 5 MG tablet Take 1 tablet (5 mg total) by mouth every evening. 09/22/18  Yes Angiulli, Lavon Paganini, PA-C  furosemide (LASIX) 40 MG tablet Take 1 tablet (40 mg total) by mouth 2 (two) times daily. 11/17/18  Yes Johnson, Clanford L, MD  glipiZIDE (GLUCOTROL) 5 MG tablet Take 5 mg by mouth 2 (two) times daily.  Yes [provider]  levothyroxine (SYNTHROID) 50 MCG tablet Take 1 tablet (50 mcg total) by mouth daily before breakfast. 09/22/18  Yes Angiulli, Lavon Paganini, PA-C  Omega-3 Fatty Acids (FISH OIL PO) Take 1 capsule by mouth daily.   Yes [provider]  potassium chloride (KLOR-CON) 10 MEQ tablet Take 1 tablet (10 mEq total) by mouth daily. 11/12/18  Yes Johnson, Clanford L, MD  povidone-Iodine (BETADINE) 5 % SOLN topical solution Apply 1 application topically daily as needed for wound care.   Yes [provider]  SANTYL ointment Apply 1 application topically daily.  11/06/18  Yes [provider]  tamsulosin (FLOMAX) 0.4 MG CAPS capsule Take 1 capsule (0.4 mg total) by mouth 2 (two) times daily. 09/22/18  Yes Angiulli, Lavon Paganini, PA-C  vitamin C (ASCORBIC ACID) 500 MG tablet Take 500 mg by mouth daily.   Yes [provider]  vitamin E 400 UNIT capsule Take 400 Units by mouth daily.   Yes [provider]    Inpatient Medications: Scheduled Meds:  aspirin EC  81 mg Oral Daily   atorvastatin  40 mg Oral Daily   dorzolamide-timolol  1 drop Both Eyes BID   famotidine  20 mg Oral Daily   finasteride  5 mg Oral QPM    furosemide  40 mg Oral BID   insulin aspart  0-9 Units Subcutaneous Q4H   levothyroxine  50 mcg Oral Q0600   potassium chloride  10 mEq Oral Daily   tamsulosin  0.4 mg Oral BID   Continuous Infusions:  heparin     piperacillin-tazobactam (ZOSYN)  IV 3.375 g (11/19/18 1245)   vancomycin     PRN Meds: acetaminophen **OR** acetaminophen, ondansetron (ZOFRAN) IV  Allergies:    Allergies  Allergen Reactions   Iodinated Diagnostic Agents     Other reaction(s): Fever   Dye Fdc Red [Red Dye] Hives   Iodine-131 Other (See Comments)    Breakout, fever   Iohexol      Code: RASH, Desc: PT STATES HOT ALL OVER HAD TO HAVE ICE PACKS ALL OVER HIM AND DIFF BREATHING, PT REFUSED IV DYE     Social History:   Social History   Socioeconomic History   Marital status: Widowed    Spouse name: Not on file   Number of children: Not on file   Years of education: Not on file   Highest education level: Not on file  Occupational History   Not on file  Social Needs   Financial resource strain: Not on file   Food insecurity    Worry: Not on file    Inability: Not on file   Transportation needs    Medical: Not on file    Non-medical: Not on file  Tobacco Use   Smoking status: Former Smoker    Quit date: 01/14/1935    Years since quitting: 83.9   Smokeless tobacco: Former Systems developer    Types: Chew  Substance and Sexual Activity   Alcohol use: No   Drug use: No   Sexual activity: Not on file  Lifestyle   Physical activity    Days per week: Not on file    Minutes per session: Not on file   Stress: Not on file  Relationships   Social connections    Talks on phone: Not on file    Gets together: Not on file    Attends religious service: Not on file    Active member of club or organization:  Not on file    Attends meetings of clubs or organizations: Not on file    Relationship status: Not on file   Intimate partner violence    Fear of current or ex partner: Not on  file    Emotionally abused: Not on file    Physically abused: Not on file    Forced sexual activity: Not on file  Other Topics Concern   Not on file  Social History Narrative   Not on file    Family History:    Family History  Problem Relation Age of Onset   Cancer Mother    Heart attack Father    Stroke Father    Heart attack Brother    Heart attack Brother      ROS:  Please see the history of present illness.  General:no colds or fevers, no weight changes Skin:no rashes or ulcers HEENT:no blurred vision, no congestion CV:see HPI PUL:see HPI GI:no diarrhea constipation or melena, no indigestion GU:no hematuria, no dysuria MS:no joint pain, no claudication, + ulcer Lt gt toe Neuro:no syncope, no lightheadedness Endo:borderline diabetes, no thyroid disease  All other ROS reviewed and negative.     Physical Exam/Data:   Vitals:   11/19/18 1245 11/19/18 1300 11/19/18 1315 11/19/18 1330  BP:  140/64  (!) 149/87  Pulse: (!) 58 60 (!) 59   Resp:    16  Temp:      TempSrc:      SpO2: 100% 100% 100%     Intake/Output Summary (Last 24 hours) at 11/19/2018 1556 Last data filed at 11/19/2018 1427 Gross per 24 hour  Intake 727.22 ml  Output 1800 ml  Net -1072.78 ml   Last 3 Weights 11/16/2018 11/15/2018 11/14/2018  Weight (lbs) 230 lb 13.2 oz 231 lb 0.7 oz 232 lb 12.9 oz  Weight (kg) 104.7 kg 104.8 kg 105.6 kg     There is no height or weight on file to calculate BMI.  General:  Well nourished, well developed, in no acute distress HEENT: normal Lymph: no adenopathy Neck: no JVD Endocrine:  No thryomegaly Vascular: No carotid bruits; do not palpate Lt pedal , Rt BKA Cardiac:  normal S1, S2; RRR; 3/6 AS murmur   Lungs:  Decreased breath sounds to auscultation bilaterally, no wheezing, occ rhonchi no rales  Abd: soft, nontender, no hepatomegaly  Ext: Lt lower ext with 1-2+ edema + erythema, and Lt gt Toe with eschar tissue with drainage, and sm ulcer Lt lateral  foot.  Musculoskeletal:  Rt BKA , MAE Skin: warm and dry  Neuro:  Alert and oriented X 3 MAE follows commands no focal abnormalities noted Psych:  Normal affect   Relevant CV Studies: ECHO 08/27/18 IMPRESSIONS    1. The left ventricle has normal systolic function, with an ejection fraction of 55-60%. The cavity size was normal. There is mildly increased left ventricular wall thickness. Left ventricular diastolic Doppler parameters are consistent with impaired  relaxation.  2. The right ventricle has normal systolic function. The cavity was mildly enlarged. There is no increase in right ventricular wall thickness.  3. Left atrial size was moderately dilated.  4. Right atrial size was mildly dilated.  5. There is mild mitral annular calcification present. No evidence of mitral valve stenosis. Mild mitral regurgitation.  6. The aortic valve is tricuspid. Severe calcifcation of the aortic valve. Moderate stenosis of the aortic valve. Mean gradient 25 mmHg.  7. The aorta is abnormal unless otherwise noted.  8. There  is mild to moderate dilatation of the ascending aorta measuring 43 mm.  9. The inferior vena cava was dilated in size with >50% respiratory variability. PA systolic pressure 44 mmHg.  FINDINGS  Left Ventricle: The left ventricle has normal systolic function, with an ejection fraction of 55-60%. The cavity size was normal. There is mildly increased left ventricular wall thickness. Left ventricular diastolic Doppler parameters are consistent  with impaired relaxation.  Right Ventricle: The right ventricle has normal systolic function. The cavity was mildly enlarged. There is no increase in right ventricular wall thickness.  Left Atrium: Left atrial size was moderately dilated.  Right Atrium: Right atrial size was mildly dilated.  Interatrial Septum: No atrial level shunt detected by color flow Doppler.  Pericardium: There is no evidence of pericardial  effusion.  Mitral Valve: The mitral valve is normal in structure. There is mild mitral annular calcification present. Mitral valve regurgitation is mild by color flow Doppler. No evidence of mitral valve stenosis.  Tricuspid Valve: The tricuspid valve is normal in structure. Tricuspid valve regurgitation is trivial by color flow Doppler.  Aortic Valve: The aortic valve is tricuspid Severe calcifcation of the aortic valve. Aortic valve regurgitation was not visualized by color flow Doppler. There is Moderate stenosis of the aortic valve, with a calculated valve area of 0.78 cm.  Pulmonic Valve: The pulmonic valve was normal in structure. Pulmonic valve regurgitation is not visualized by color flow Doppler.  Aorta: The aorta is abnormal unless otherwise noted. There is mild to moderate dilatation of the ascending aorta measuring 43 mm.  Venous: The inferior vena cava is dilated in size with greater than 50% respiratory variability.    08/25/18  Lower ext angio  Interventional Procedure:  Angiography showed patent right SFA and popliteal arteries with occluded anterior tibial artery proximally with reconstitution via collateral in the midsegment all the way down to the foot and then giving collaterals to the peroneal artery.  I decided to proceed with revascularization attempt.  Ultrasound was used to identify the distal right anterior tibial artery.  A permanent image was stored.  Under direct ultrasound visualization, micropuncture pedal axis sheath was placed.  I gave 2000 units of unfractionated heparin in addition to 3 mg of verapamil and 200 mcg of nitroglycerin via the sheath.  I then exchanged the sheath in the left common femoral artery into a 6 French 45 cm destination sheath.  Full heparinization was given at this point.  I attempted to cross the occlusion retrograde using a 12 g tip Zillient wire over an 014 quick cross catheter.  I was able to cross the occlusion distally but was  not able to get into the true lumen in the proximal area.  Thus, I brought another similar wire antegrade over a quick cross catheter and was able to cross the proximal cap but again had difficulty getting back into the true lumen distally.  The wire from below was left in place for guidance.  I used 30 g tip Zillient wire over a tele-port catheter antegrade and was able to cross the occlusion.  The wire and catheter were removed from below.  Balloon angioplasty was performed with a 3.0 x 120 mm balloon for 2 minutes.  This established normal flow with 30% residual stenosis.  I elected to use a bigger balloon at 3.5 x 150 mm balloon and performed another inflation for 2 minutes.  Nitroglycerin was given via the sheath and final angiography showed excellent results with no evidence  of dissection and less than 20% residual stenosis.  There was brisk flow all the way down to the foot.  The pedal access sheath was removed and manual pressure was held.  The sheath in the left groin was exchanged into a short 6 [place] sheath. Hemostasis was achieved with a  Mynx closure device. The patient tolerated the procedure well with no immediate complications.     Findings:  Right common femoral artery: Minor irregularities. Right profunda femoral artery: Minor irregularities Right superficial femoral artery:  Mild diffuse calcified disease throughout its course. Right popliteal artery: Heavily calcified with patent atherectomy site with 40% stenosis. Right tibial peroneal trunk: Occluded Right anterior tibial artery: Medium length occlusion proximally with reconstitution via collaterals in the midsegment.  This gives collaterals distally to the peroneal artery.  Estimated blood loss <50 mL.  Successful complex revascularization of anterior tibial artery with balloon angioplasty via the antegrade and retrograde fashion.  During this procedure medications were administered to achieve and maintain moderate conscious  sedation while the patient's heart rate, blood pressure, and oxygen saturation were continuously monitored and I was present face-to-face 100% of this time.   Arterial duplex Rt 08/10/18  Summary: Right: The popiteal artery is now patent throughout its course down to the tibioperoneal trunk. There is diffuse atherosclerosis seen in the common femoral artery, superficial artery and popiteal artery. There is a 5.2 x 2.1 x 3.2 cm Baker's cyst in the  popiteal fossa. Marked improvement is noted compared to previous study.    See table(s) above for measurements and observations.  Suggest follow up study in 6 months.  08/10/18 VAS [place] ABI   Right ABIs appear increased. Left ABIs appear essentially unchanged. Right TBI not attempted due to bandage. Left TBI the waveform was absent.   Summary: Right: Resting right ankle-brachial index indicates moderate right lower extremity arterial disease. There is improvement in today's ABI compared to prior test. The right TBI was not attempted due to bandage.   Left: Resting left ankle-brachial index indicates moderate left lower extremity arterial disease. The left ABI is essentially unchanged from prior exam. Left TBI, the waveform was absent.   PV PCI  08/03/18 Procedures Performed:               1.  Left common femoral artery access/contralateral access (second order catheter placement)               2.  Diamondback orbital rotational atherectomy distal right SFA/above-the-knee popliteal artery               3.  Chocolate balloon angioplasty distal right SFA/above-the-knee popliteal artery               4.  Drug-coated balloon angioplasty distal right SFA/above-knee popliteal artery    PV angio 07/27/18 Angiographic Data:   1: Abdominal aortogram- the distal dome aorta was moderately atherosclerotic 2: Left lower extremity- the previously placed left SFA stent was widely patent.  There were several areas of high-grade calcific plaque in  the distal left SFA and popliteal artery with 0 vessel runoff 3: Right lower extremity- subtotally occluded calcified distal right SFA and above-knee popliteal artery with 0 vessel runoff.  There were faint collaterals to the tibial vessels    IMPRESSION: Mr. Sauceda has high-grade calcified distal right SFA stenosis extending to the P1 segment of the popliteal artery with 0 vessel runoff.  He does not have an identifiable tibial vessel.  There are collaterals however.  I  used 135 cc of contrast.  I favor staged diamondback orbital rotational arthrectomy, PTA of the distal right SFA to provide adequate perfusion to the collateral vessels and the potential to heal a right BKA.  The sheath was removed and pressure was held on the left groin to achieve hemostasis.  The patient left lab in stable condition.  We will arrange stage intervention sometime within the next week.  Plan for discharge home today.    Laboratory Data:  High Sensitivity Troponin:   Recent Labs  Lab 11/09/18 1209  TROPONINIHS 14     Chemistry Recent Labs  Lab 11/16/18 0504 11/18/18 2123 11/19/18 0417  NA 131* 132* 132*  K 3.6 3.8 3.5  CL 93* 96* 96*  CO2 27 23 26   GLUCOSE 129* 111* 145*  BUN 35* 31* 31*  CREATININE 1.45* 1.27* 1.38*  CALCIUM 8.4* 8.8* 8.7*  GFRNONAA 42* 50* 45*  GFRAA 49* 58* 52*  ANIONGAP 11 13 10     Recent Labs  Lab 11/18/18 2123 11/19/18 0417  PROT 7.1 6.5  ALBUMIN 3.3* 3.0*  AST 33 28  ALT 25 22  ALKPHOS 47 41  BILITOT 0.7 0.6   Hematology Recent Labs  Lab 11/16/18 0504 11/18/18 2132  WBC 9.2 10.8*  RBC 3.24* 3.77*  HGB 9.8* 11.5*  HCT 30.4* 34.9*  MCV 93.8 92.6  MCH 30.2 30.5  MCHC 32.2 33.0  RDW 14.6 14.4  PLT 256 310   BNP Recent Labs  Lab 11/18/18 2132  BNP 213.1*    DDimer No results for input(s): DDIMER in the last 168 hours.   Radiology/Studies:  Dg Chest 2 View  Result Date: 11/18/2018 CLINICAL DATA:  85 y.o male. Hx CHF, HTN. Recent admission for  same. EXAM: CHEST - 2 VIEW COMPARISON:  Chest radiograph 11/13/2018 FINDINGS: Stable cardiomediastinal contours with enlarged heart size. Central vascular congestion. Hazy bibasilar pulmonary opacities likely reflecting combination of atelectasis and pleural fluid, superimposed infection not excluded. Moderate bilateral pleural effusions. No pneumothorax. No acute finding in the visualized skeleton. IMPRESSION: 1. Cardiomegaly with central vascular congestion. 2. Hazy bibasilar pulmonary opacities likely reflecting a combination of atelectasis and pleural fluid, superimposed infection not excluded. Moderate bilateral pleural effusions. Electronically Signed   By: Audie Pinto M.D.   On: 11/18/2018 19:44   Dg Foot 2 Views Left  Result Date: 11/18/2018 CLINICAL DATA:  85 y.o male. Black color to greater toe, redness and swelling. EXAM: LEFT FOOT - 2 VIEW COMPARISON:  None. FINDINGS: There is no evidence of fracture or dislocation. No evidence of bony destruction. Moderate scattered degenerative changes. Extensive vascular calcification. IMPRESSION: No acute osseous abnormality in the left foot. Electronically Signed   By: Audie Pinto M.D.   On: 11/18/2018 19:42    Assessment and Plan:   1. Critical limb ischemia of Lt foot with increased ulcer eschar tissue and drainage.  Dr. Gwenlyn Found to see. Plan for PV angio on Monday and will plan for fluids 50 cc Hr 6 hours prior.   2. PAD with prior patent stent to Lt SFA on recent PV angio. 3. PAF eliquis has been held and was to start on IV heparin  4. HLD on lipitor continue 5. DM-2 per IM      For questions or updates, please contact [place] Please consult [email or web address] for contact info under     Signed, Cecilie Kicks, NP  11/19/2018 3:56 PM  Agree with note written by Cecilie Kicks RNP  Patient well-known to me from  prior procedures.  85 year old Caucasian male with a history of PAD and critical limb ischemia in the past.  I did  angiogram him in July and performed orbital atherectomy and drug-coated balloon angioplasty of a subtotally occluded calcified distal right SFA.  In a staged procedure Dr. Fletcher Anon opened up in the anterior tibial to the foot but unfortunately he went on to have a right BKA.  The stump is healed nicely.  He has developed critical limb ischemia on the left with a gangrenous lesion on his left great toe.  At the time of initial angiogram I documented a patent mid left SFA stent with high-grade segmental calcified distal SFA and popliteal stenosis with 0 vessel runoff on the left.  He was hospitalized at [facility] last week with volume overload and was diuresed over 20 pounds.  He denies chest pain or shortness of breath.  He has some mild edema with it with some erythema on his left foot with some progression of the gangrenous lesion.  His exam otherwise is benign.  He does have moderate aortic stenosis.  Plans are to continue IV antibiotics and diuresis with the intent to perform angiography and endovascular therapy for limb salvage on Monday by myself which is already been scheduled.  I have discussed this with the patient and his son [name].   [name] 11/19/2018 4:11 PM

## 2018-11-19 NOTE — Consult Note (Signed)
ORTHOPAEDIC CONSULTATION  REQUESTING PHYSICIAN: Verlee Monte, MD  Chief Complaint: Chronic ischemic ulcer left great toe status post right transtibial amputation.  HPI: [name] is a 85 y.o. male who presents with worsening of the ischemic ulcer to the left great toe with cellulitis of the left forefoot.  Patient has a history of peripheral vascular disease status post a right below the knee amputation in August.  Status post arterial studies with Dr. [name] in July, with repeat vascular studies in August.  Past Medical History:  Diagnosis Date  . Arthritis   . Borderline diabetic   . Glaucoma   . Hyperlipidemia   . Hypertension   . PVD (peripheral vascular disease) ([place])   . Sleep apnea    Cpap ordered but doesnt use   Past Surgical History:  Procedure Laterality Date  . ABDOMINAL AORTOGRAM W/LOWER EXTREMITY N/A 07/27/2018   Procedure: ABDOMINAL AORTOGRAM W/LOWER EXTREMITY;  Surgeon: Lorretta Harp, MD;  Location: [facility];  Service: Cardiovascular;  Laterality: N/A;  . AMPUTATION Right 08/28/2018   Procedure: RIGHT BELOW KNEE AMPUTATION;  Surgeon: [name], MD;  Location: [facility];  Service: Orthopedics;  Laterality: Right;  . APPENDECTOMY  1943  . CATARACT EXTRACTION  2012   x2  . COLONOSCOPY N/A 11/01/2014   Procedure: COLONOSCOPY;  Surgeon: Aviva Signs, MD;  [facility];  Service: Gastroenterology;  Laterality: N/A;  . ESOPHAGOGASTRODUODENOSCOPY N/A 11/01/2014   Procedure: ESOPHAGOGASTRODUODENOSCOPY (EGD);  Surgeon: Aviva Signs, MD;  [facility];  Service: Gastroenterology;  Laterality: N/A;  . HERNIA REPAIR Left   . INGUINAL HERNIA REPAIR Right 03/01/2013   Procedure: RIGHT INGUINAL HERNIORRHAPHY;  Surgeon: Jamesetta So, MD;  Location: [facility];  Service: General;  Laterality: Right;  . INSERTION OF MESH Right 03/01/2013   Procedure: INSERTION OF MESH;  Surgeon: Jamesetta So, MD;  Location: [facility];  Service:  General;  Laterality: Right;  . LOWER EXTREMITY ANGIOGRAPHY Right 08/25/2018   Procedure: LOWER EXTREMITY ANGIOGRAPHY;  Surgeon: Wellington Hampshire, MD;  Location: [facility];  Service: Cardiovascular;  Laterality: Right;  . NM MYOCAR PERF WALL MOTION  06/05/2010   no significant ischemia  . PERIPHERAL VASCULAR ATHERECTOMY Right 08/03/2018   Procedure: PERIPHERAL VASCULAR ATHERECTOMY;  Surgeon: Lorretta Harp, MD;  Location: [facility];  Service: Cardiovascular;  Laterality: Right;  . stent  06/14/2010   left leg  . US ECHOCARDIOGRAPHY  01/21/2006   moderate mitral annular ca+, mild MR, AOV moderately sclerotic.   Social History   Socioeconomic History  . Marital status: Widowed    Spouse name: Not on file  . Number of children: Not on file  . Years of education: Not on file  . Highest education level: Not on file  Occupational History  . Not on file  Social Needs  . Financial resource strain: Not on file  . Food insecurity    Worry: Not on file    Inability: Not on file  . Transportation needs    Medical: Not on file    Non-medical: Not on file  Tobacco Use  . Smoking status: Former Smoker    Quit date: 01/14/1935    Years since quitting: 83.9  . Smokeless tobacco: Former Systems developer    Types: Chew  Substance and Sexual Activity  . Alcohol use: No  . Drug use: No  . Sexual activity: Not on file  Lifestyle  . Physical activity  Days per week: Not on file    Minutes per session: Not on file  . Stress: Not on file  Relationships  . Social Herbalist on phone: Not on file    Gets together: Not on file    Attends religious service: Not on file    Active member of club or organization: Not on file    Attends meetings of clubs or organizations: Not on file    Relationship status: Not on file  Other Topics Concern  . Not on file  Social History Narrative  . Not on file   Family History  Problem Relation Age of Onset  . Cancer Mother   . Heart  attack Father   . Stroke Father   . Heart attack Brother   . Heart attack Brother    - negative except otherwise stated in the family history section Allergies  Allergen Reactions  . Iodinated Diagnostic Agents     Other reaction(s): Fever  . Dye Fdc Red [Red Dye] Hives  . Iodine-131 Other (See Comments)    Breakout, fever  . Iohexol      Code: RASH, Desc: PT STATES HOT ALL OVER HAD TO HAVE ICE PACKS ALL OVER HIM AND DIFF BREATHING, PT REFUSED IV DYE    Prior to Admission medications   Medication Sig Start Date End Date Taking? Authorizing Provider  acetaminophen (TYLENOL) 325 MG tablet Take 1-2 tablets (325-650 mg total) by mouth every 6 (six) hours as needed for mild pain (pain score 1-3 or temp > 100.5). 09/22/18  Yes Angiulli, Lavon Paganini, PA-C  apixaban (ELIQUIS) 2.5 MG TABS tablet Take 1 tablet (2.5 mg total) by mouth 2 (two) times daily. 10/09/18  Yes Lorretta Harp, MD  aspirin EC 81 MG tablet Take 81 mg by mouth daily.   Yes [provider]  atorvastatin (LIPITOR) 40 MG tablet Take 1 tablet (40 mg total) by mouth daily. 09/22/18  Yes Angiulli, Lavon Paganini, PA-C  dorzolamide-timolol (COSOPT) 22.3-6.8 MG/ML ophthalmic solution Place 1 drop into both eyes 2 (two) times daily.   Yes [provider]  doxazosin (CARDURA) 2 MG tablet Take 1 tablet (2 mg total) by mouth at bedtime. 09/22/18  Yes Angiulli, Lavon Paganini, PA-C  famotidine (PEPCID) 20 MG tablet Take 1 tablet (20 mg total) by mouth daily. 11/12/18  Yes Johnson, Clanford L, MD  finasteride (PROSCAR) 5 MG tablet Take 1 tablet (5 mg total) by mouth every evening. 09/22/18  Yes Angiulli, Lavon Paganini, PA-C  furosemide (LASIX) 40 MG tablet Take 1 tablet (40 mg total) by mouth 2 (two) times daily. 11/17/18  Yes Johnson, Clanford L, MD  glipiZIDE (GLUCOTROL) 5 MG tablet Take 5 mg by mouth 2 (two) times daily.    Yes [provider]  levothyroxine (SYNTHROID) 50 MCG tablet Take 1 tablet (50 mcg total) by mouth daily before  breakfast. 09/22/18  Yes Angiulli, Lavon Paganini, PA-C  Omega-3 Fatty Acids (FISH OIL PO) Take 1 capsule by mouth daily.   Yes [provider]  potassium chloride (KLOR-CON) 10 MEQ tablet Take 1 tablet (10 mEq total) by mouth daily. 11/12/18  Yes Johnson, Clanford L, MD  povidone-Iodine (BETADINE) 5 % SOLN topical solution Apply 1 application topically daily as needed for wound care.   Yes [provider]  SANTYL ointment Apply 1 application topically daily.  11/06/18  Yes [provider]  tamsulosin (FLOMAX) 0.4 MG CAPS capsule Take 1 capsule (0.4 mg total) by mouth  2 (two) times daily. 09/22/18  Yes Angiulli, Lavon Paganini, PA-C  vitamin C (ASCORBIC ACID) 500 MG tablet Take 500 mg by mouth daily.   Yes [provider]  vitamin E 400 UNIT capsule Take 400 Units by mouth daily.   Yes [provider]   Dg Chest 2 View  Result Date: 11/18/2018 CLINICAL DATA:  85 y.o male. Hx CHF, HTN. Recent admission for same. EXAM: CHEST - 2 VIEW COMPARISON:  Chest radiograph 11/13/2018 FINDINGS: Stable cardiomediastinal contours with enlarged heart size. Central vascular congestion. Hazy bibasilar pulmonary opacities likely reflecting combination of atelectasis and pleural fluid, superimposed infection not excluded. Moderate bilateral pleural effusions. No pneumothorax. No acute finding in the visualized skeleton. IMPRESSION: 1. Cardiomegaly with central vascular congestion. 2. Hazy bibasilar pulmonary opacities likely reflecting a combination of atelectasis and pleural fluid, superimposed infection not excluded. Moderate bilateral pleural effusions. Electronically Signed   By: Audie Pinto M.D.   On: 11/18/2018 19:44   Dg Foot 2 Views Left  Result Date: 11/18/2018 CLINICAL DATA:  85 y.o male. Black color to greater toe, redness and swelling. EXAM: LEFT FOOT - 2 VIEW COMPARISON:  None. FINDINGS: There is no evidence of fracture or dislocation. No evidence of bony destruction.  Moderate scattered degenerative changes. Extensive vascular calcification. IMPRESSION: No acute osseous abnormality in the left foot. Electronically Signed   By: Audie Pinto M.D.   On: 11/18/2018 19:42   - pertinent xrays, CT, MRI studies were reviewed and independently interpreted  Positive ROS: All other systems have been reviewed and were otherwise negative with the exception of those mentioned in the HPI and as above.  Physical Exam: General: Alert, no acute distress Psychiatric: Patient is competent for consent with normal mood and affect Lymphatic: No axillary or cervical lymphadenopathy Cardiovascular: No pedal edema Respiratory: No cyanosis, no use of accessory musculature GI: No organomegaly, abdomen is soft and non-tender    Images:  @ENCIMAGES @  Labs:  Lab Results  Component Value Date   HGBA1C 5.9 (H) 11/09/2018   HGBA1C 7.0 (H) 05/29/2018   HGBA1C 8.1 (H) 07/25/2017   ESRSEDRATE 84 (H) 11/18/2018   CRP 5.1 (H) 11/18/2018   CRP <0.8 05/29/2018   REPTSTATUS PENDING 11/18/2018   CULT PENDING 11/18/2018   LABORGA ESCHERICHIA COLI (A) 09/28/2018    Lab Results  Component Value Date   ALBUMIN 3.0 (L) 11/19/2018   ALBUMIN 3.3 (L) 11/18/2018   ALBUMIN 3.2 (L) 11/09/2018    Neurologic: Patient does not have protective sensation bilateral lower extremities.   MUSCULOSKELETAL:   Skin: Examination patient has worsening of the ischemic changes to the left great toe his forefoot is thin mottled and atrophic with cellulitis to the midfoot.  Patient does not have a palpable dorsalis pedis pulse.  Patient's calf is soft nontender.  Assessment: Assessment: Peripheral vascular disease status post right transtibial amputation in August with worsening of the ischemic changes to the left great toe.  Plan: Plan: I have contacted Dr. Donnella Bi to see if patient would be a candidate for  vascular intervention for the left lower extremity.  Patient's cellulitis is  currently being treated appropriately with vancomycin and Zosyn.  I will continue to follow pending the results of vascular intervention.  Thank you for the consult and the opportunity to see [name], [place] [phone] 7:56 AM

## 2018-11-19 NOTE — ED Notes (Signed)
[facility] called for transport. 

## 2018-11-19 NOTE — Telephone Encounter (Signed)
Audrea Muscat Persons, PA to Dr. Sharol Given would like to know if Dr. Gwenlyn Found would like the patient to have any other vascular studies done to evaluate the patient's toe/foot. The patient was consulted at [facility] and has gangrene in his toe, and Dr. Sharol Given is worried about vascular circulation in his lower leg which could prevent healing of the foot. The patient will have to have the toe amputated.  If Dr. Gwenlyn Found would like to contact Dr. Sharol Given directly, Audrea Muscat can arrange that as well.

## 2018-11-19 NOTE — Progress Notes (Signed)
ANTICOAGULATION CONSULT NOTE - Initial Consult  Pharmacy Consult for heaprin Indication: PVD - on Eliquis PTA  Allergies  Allergen Reactions  . Iodinated Diagnostic Agents     Other reaction(s): Fever  . Dye Fdc Red [Red Dye] Hives  . Iodine-131 Other (See Comments)    Breakout, fever  . Iohexol      Code: RASH, Desc: PT STATES HOT ALL OVER HAD TO HAVE ICE PACKS ALL OVER HIM AND DIFF BREATHING, PT REFUSED IV DYE     Patient Measurements:   Heparin Dosing Weight:  Wt 104.7 kg ABW 87.1 kg  Vital Signs: BP: 149/87 (11/05 1330) Pulse Rate: 59 (11/05 1315)  Labs: Recent Labs    11/18/18 2123 11/18/18 2132 11/19/18 0417  HGB  --  11.5*  --   HCT  --  34.9*  --   PLT  --  310  --   CREATININE 1.27*  --  1.38*    Estimated Creatinine Clearance: 44.7 mL/min (A) (by C-G formula based on SCr of 1.38 mg/dL (H)).   Medical History: Past Medical History:  Diagnosis Date  . Arthritis   . Borderline diabetic   . Glaucoma   . Hyperlipidemia   . Hypertension   . PVD (peripheral vascular disease) ([place])   . Sleep apnea    Cpap ordered but doesnt use    Assessment: Pharmacy consulted to dose heparin as bridge therapy while PTA Eliquis for PAD is on hold for worsening ischemic changes to the left great toe.  Vascular surgery and ortho on board. Hg 11.5, Pltc 310 - stable.  No bleeding reported.  PTA: apixaban 2.5 mg po BID - LD 11/5 at 1055 am.    Goal of Therapy:  Heparin level 0.3-0.7 units/ml aPTT 66-102 seconds Monitor platelets by anticoagulation protocol: Yes   Plan:  Draw baseline APTT and heparin level now Start heparin with no bolus tonight at 2300 at rate of 1200 units/hr Check 8 hr aPTT/heparin level  Daily APTT, heparin level and CBC while on heparin   Eudelia Bunch, Pharm.D [phone] 11/19/2018 2:28 PM

## 2018-11-19 NOTE — ED Notes (Signed)
Unable to give 1400 ABX.  IV infiltrated and EMS arrived to transport pt to [facility].

## 2018-11-19 NOTE — Progress Notes (Signed)
[facility] PROGRESS NOTE   [name] JIR:[id number] DOB: 10-Mar-1927 DOA: 11/18/2018 PCP: Redmond School, MD  Subjective: Seen with his son at bedside, denies any complaints. The left great toe shows dry gangrene, reported little to no pain. Pending transfer to [place] to be evaluated by Dr. Gwenlyn Found. Dr. Sharol Given evaluated the patient earlier today.  HPI: [name]  is a 85 y.o. male, w hypertension, hyperlipidemia, Dm2, ckd stage3, Pafib, CHF (diastolic), PAD, s/p R BKA presents from home due to black 1st toe left foot. Pt was seen by wound care earlier in the day and sent to ER for evaluation. Slight redness of the dorsum of the left foot, and skin ulcer 2-19m on the lateral aspect of the left foot.   Pt denies fever, chills, cough, cp, palp, sob.   In ED,  T 98.2, P 65, R 18, Bp 167/73 pox 98% on RA  Left foot xray IMPRESSION: No acute osseous abnormality in the left foot.  CXR IMPRESSION: 1. Cardiomegaly with central vascular congestion. 2. Hazy bibasilar pulmonary opacities likely reflecting a combination of atelectasis and pleural fluid, superimposed infection not excluded. Moderate bilateral pleural effusions.  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 DSharol Givenevaluated the patient, needs vascular eval. [place] to be [place] [facility] On aspirin and Xarelto, hold Xarelto continue aspirin Anticipating surgery  hold Xarelto, start on heparin  Hyperlipidemia Cont Lipitor '40mg'$  po qhs  Dm2 Hold Glipizide Fsbs q4h, ISS  Hypothyroidism Cont Levothyroxine 50 micrograms po qday  BPH Cont Flomax 0.'4mg'$  po bid Cont Finasteride '5mg'$  po qday  Glaucoma Cont Cosopt  Code Status: DNR Family Communication: Plan discussed with the patient. Disposition Plan: Remains inpatient Diet:  Diet Order            Diet NPO time specified Except for: Sips with Meds  Diet effective now              Consultants:  DQuinn Plowman Procedures:  None  Antibiotics:  Zosyn and Vanc   Objective: Vitals:   11/19/18 1315 11/19/18 1330  BP:  (!) 149/87  Pulse: (!) 59   Resp:  16  Temp:    SpO2: 100%     Intake/Output Summary (Last 24 hours) at 11/19/2018 1357 Last data filed at 11/19/2018 1107 Gross per 24 hour  Intake 727.22 ml  Output -  Net 727.22 ml   There were no vitals filed for this visit.  Exam: General: Alert and awake, oriented x3, not in any acute distress. HEENT: anicteric sclera, pupils reactive to light and accommodation, EOMI CVS: S1-S2 clear, no murmur rubs or gallops Chest: clear to auscultation bilaterally, no wheezing, rales or rhonchi Abdomen: soft nontender, nondistended, normal bowel sounds, no organomegaly Extremities: no cyanosis, clubbing or edema noted bilaterally Neuro: Cranial nerves II-XII intact, no focal neurological deficits  Data Reviewed: Basic Metabolic Panel: Recent Labs  Lab 11/14/18 0639 11/15/18  7253 11/16/18 0504 11/18/18 2123 11/19/18 0417  NA 133* 133* 131* 132* 132*  K 3.7 3.5 3.6 3.8 3.5  CL 93* 95* 93* 96* 96*  CO2 _0 GLUCOSE 127* 123* 129* 111* 145*  BUN 37* 35* 35* 31* 31*  CREATININE 1.40* 1.33* 1.45* 1.27* 1.38*  CALCIUM 8.7* 8.6* 8.4* 8.8* 8.7*   Liver Function Tests: Recent Labs  Lab 11/18/18 2123 11/19/18 0417  AST 33 28  ALT 25 22  ALKPHOS 47 41  BILITOT 0.7 0.6  PROT 7.1 6.5  ALBUMIN 3.3* 3.0*    No results for input(s): LIPASE, AMYLASE in the last 168 hours. No results for input(s): AMMONIA in the last 168 hours. CBC: Recent Labs  Lab 11/16/18 0504 11/18/18 2132  WBC 9.2 10.8*  NEUTROABS  --  5.6  HGB 9.8* 11.5*  HCT 30.4* 34.9*  MCV 93.8 92.6  PLT 256 310   Cardiac Enzymes: No results for input(s): CKTOTAL, CKMB, CKMBINDEX, TROPONINI in the last 168 hours. BNP (last 3 results) Recent Labs    11/09/18 1209 11/18/18 2132  BNP 258.0* 213.1*    ProBNP (last 3 results) No results for input(s): PROBNP in the last 8760 hours.  CBG: Recent Labs  Lab 11/16/18 0723 11/16/18 1056 11/19/18 0235 11/19/18 0353 11/19/18 1106  GLUCAP 127* 138* 133* 132* 92    Micro Recent Results (from the past 240 hour(s))  Blood culture (routine x 2)     Status: None (Preliminary result)   Collection Time: 11/18/18  9:07 PM   Specimen: BLOOD RIGHT ARM  Result Value Ref Range Status   Specimen Description   Final    BLOOD RIGHT ARM Performed at [facility], [place] [address], [place], [place] [address]    Special Requests   Final    BOTTLES DRAWN AEROBIC ONLY Blood Culture adequate volume Performed at [facility] [address], [place], [place] [address]    Culture PENDING  Incomplete   Report Status PENDING  Incomplete  SARS CORONAVIRUS 2 (TAT 6-24 HRS) Nasopharyngeal Nasopharyngeal Swab     Status: None   Collection Time: 11/18/18 10:04 PM   Specimen: Nasopharyngeal Swab  Result Value Ref Range Status   SARS Coronavirus 2 NEGATIVE NEGATIVE Final    Comment: (NOTE) SARS-CoV-2 target nucleic acids are NOT DETECTED. The SARS-CoV-2 RNA is generally detectable in upper and lower respiratory specimens during the acute phase of infection. Negative results do not preclude SARS-CoV-2 infection, do not rule out co-infections with other pathogens, and should not be used as the sole basis for treatment or other patient management decisions. Negative  results must be combined with clinical observations, patient history, and epidemiological information. The expected result is Negative. Fact Sheet for Patients: SugarRoll.be Fact Sheet for Healthcare Providers: https://www.woods-mathews.com/ This test is not yet approved or cleared by the [place] FDA and  has been authorized for detection and/or diagnosis of SARS-CoV-2 by FDA under an Emergency Use Authorization (EUA). This EUA will remain  in effect (meaning this test can be used) for the duration of the COVID-19 declaration under Section 56 4(b)(1) of the Act, 21 U.S.C. section 360bbb-3(b)(1), unless the authorization is terminated or revoked sooner. Performed at [facility], [place] [address] [place], [place] [address]      Studies: Dg Chest 2 View  Result Date: 11/18/2018 CLINICAL DATA:  85 y.o male. Hx CHF, HTN. Recent admission for same. EXAM: CHEST - 2 VIEW COMPARISON:  Chest radiograph 11/13/2018 FINDINGS: Stable cardiomediastinal contours with enlarged heart size. Central vascular congestion. Hazy bibasilar pulmonary opacities likely reflecting combination of atelectasis and pleural fluid, superimposed infection not excluded. Moderate bilateral pleural effusions. No pneumothorax. No acute finding in the visualized skeleton. IMPRESSION: 1. Cardiomegaly with central vascular congestion. 2. Hazy bibasilar pulmonary opacities likely reflecting a combination of atelectasis and pleural fluid, superimposed infection not excluded. Moderate bilateral pleural effusions. Electronically Signed   By: Audie Pinto M.D.   On: 11/18/2018 19:44   Dg Foot 2 Views Left  Result Date: 11/18/2018 CLINICAL DATA:  85 y.o male. Black color to greater toe, redness and swelling. EXAM: LEFT FOOT - 2 VIEW COMPARISON:  None. FINDINGS: There is no evidence of fracture or dislocation. No evidence of bony destruction. Moderate scattered degenerative changes.  Extensive vascular calcification. IMPRESSION: No acute osseous abnormality in the left foot. Electronically Signed   By: Audie Pinto M.D.   On: 11/18/2018 19:42    Scheduled Meds: . apixaban  2.5 mg Oral BID  . aspirin EC  81 mg Oral Daily  . atorvastatin  40 mg Oral Daily  . dorzolamide-timolol  1 drop Both Eyes BID  . famotidine  20 mg Oral Daily  . finasteride  5 mg Oral QPM  . furosemide  40 mg Oral BID  . insulin aspart  0-9 Units Subcutaneous Q4H  . levothyroxine  50 mcg Oral Q0600  . potassium chloride  10 mEq Oral Daily  . tamsulosin  0.4 mg Oral BID   Continuous Infusions: . piperacillin-tazobactam (ZOSYN)  IV 3.375 g (11/19/18 5797)  . vancomycin         Time spent: 35 minutes    [facility] Pager [phone] If 7PM-7AM, please contact night-coverage at [email or web address], password [facility] 11/19/2018, 1:57 PM  LOS: 0 days

## 2018-11-19 NOTE — Telephone Encounter (Signed)
Will forward to Dr Gwenlyn Found for review

## 2018-11-19 NOTE — ED Notes (Signed)
[facility] called and they will transport this pt to [facility].

## 2018-11-20 ENCOUNTER — Other Ambulatory Visit: Payer: Self-pay

## 2018-11-20 LAB — BASIC METABOLIC PANEL
Anion gap: 12 (ref 5–15)
BUN: 29 mg/dL — ABNORMAL HIGH (ref 8–23)
CO2: 21 mmol/L — ABNORMAL LOW (ref 22–32)
Calcium: 8.8 mg/dL — ABNORMAL LOW (ref 8.9–10.3)
Chloride: 100 mmol/L (ref 98–111)
Creatinine, Ser: 1.53 mg/dL — ABNORMAL HIGH (ref 0.61–1.24)
GFR calc Af Amer: 46 mL/min — ABNORMAL LOW (ref >60)
GFR calc non Af Amer: 40 mL/min — ABNORMAL LOW (ref >60)
Glucose, Bld: 98 mg/dL (ref 70–99)
Potassium: 4.3 mmol/L (ref 3.5–5.1)
Sodium: 133 mmol/L — ABNORMAL LOW (ref 135–145)

## 2018-11-20 LAB — CBC
HCT: 31.4 % — ABNORMAL LOW (ref 39.0–52.0)
Hemoglobin: 10.4 g/dL — ABNORMAL LOW (ref 13.0–17.0)
MCH: 30.2 pg (ref 26.0–34.0)
MCHC: 33.1 g/dL (ref 30.0–36.0)
MCV: 91.3 fL (ref 80.0–100.0)
Platelets: 220 10*3/uL (ref 150–400)
RBC: 3.44 MIL/uL — ABNORMAL LOW (ref 4.22–5.81)
RDW: 14.6 % (ref 11.5–15.5)
WBC: 8.2 10*3/uL (ref 4.0–10.5)
nRBC: 0 % (ref 0.0–0.2)

## 2018-11-20 LAB — MAGNESIUM: Magnesium: 2.1 mg/dL (ref 1.7–2.4)

## 2018-11-20 LAB — GLUCOSE, CAPILLARY
Glucose-Capillary: 103 mg/dL — ABNORMAL HIGH (ref 70–99)
Glucose-Capillary: 102 mg/dL — ABNORMAL HIGH (ref 70–99)
Glucose-Capillary: 117 mg/dL — ABNORMAL HIGH (ref 70–99)
Glucose-Capillary: 175 mg/dL — ABNORMAL HIGH (ref 70–99)
Glucose-Capillary: 134 mg/dL — ABNORMAL HIGH (ref 70–99)

## 2018-11-20 LAB — HEPARIN LEVEL (UNFRACTIONATED): Heparin Unfractionated: 1.8 IU/mL — ABNORMAL HIGH (ref 0.30–0.70)

## 2018-11-20 LAB — APTT
aPTT: 74 seconds — ABNORMAL HIGH (ref 24–36)
aPTT: 112 seconds — ABNORMAL HIGH (ref 24–36)

## 2018-11-20 MED ORDER — VANCOMYCIN HCL IN DEXTROSE 750-5 MG/150ML-% IV SOLN
750.0000 mg | INTRAVENOUS | Status: DC
  Administered 2018-11-20: 750 mg via INTRAVENOUS
  Filled 2018-11-20 (×2): qty 150

## 2018-11-20 MED ORDER — COLLAGENASE 250 UNIT/GM EX OINT
TOPICAL_OINTMENT | Freq: Every day | CUTANEOUS | Status: DC
  Administered 2018-11-21 – 2018-11-25 (×5): via TOPICAL
  Administered 2018-11-26: 1 via TOPICAL
  Administered 2018-11-27 – 2018-12-02 (×6): via TOPICAL
  Filled 2018-11-20: qty 30

## 2018-11-20 NOTE — Progress Notes (Signed)
[facility] PROGRESS NOTE   [name] EXN:[id number] DOB: 09/13/1927 DOA: 11/18/2018 PCP: Redmond School, MD  Subjective: Patient seen and examined.  No overnight events.  Not having much pain.  Aware that his procedure will be on Monday.  Brief history: [name]  is a 85 y.o. male, w hypertension, hyperlipidemia, Dm2, ckd stage3, Pafib, CHF (diastolic), PAD, s/p R BKA presents from home due to black 1st toe left foot. Pt was seen by wound care earlier in the day and sent to ER for evaluation. Slight redness of the dorsum of the left foot, and skin ulcer 2-41mm on the lateral aspect of the left foot.   Pt denies fever, chills, cough, cp, palp, sob.  In the emergency room hemodynamically stable afebrile. Left foot x-ray without evidence of osteomyelitis. Electrolytes fairly normal.  Assessment/Plan: Principal Problem:   Toe infection Active Problems:   Peripheral vascular disease (HCC)   Essential hypertension, benign   Hyperlipidemia   Aortic valve stenosis   Hypothyroidism   PAF (paroxysmal atrial fibrillation) (HCC)   Type 2 diabetes mellitus with foot ulcer, without long-term current use of insulin (HCC)   1st toe left foot dry gangrene with ischemic ulcer and cellulitis.  Blood culture negative so far. Vanco iv, Zosyn iv pharmacy to dose, continue Patient for angiogram and probable revascularization followed by toe amputation. Seen and followed by cardiology and orthopedics.  PAD On aspirin and Xarelto, hold Xarelto continue aspirin Currently on heparin drip anticipation for angiogram and amputation.  Hyperlipidemia Cont Lipitor 40mg  po qhs, checking lipid panel.  Type 2 diabetes on oral hypoglycemics at home.  Well controlled. Hold Glipizide Fsbs q4h, ISS  Hypothyroidism Cont Levothyroxine 50 micrograms po qday  BPH Cont Flomax 0.4mg  po bid Cont Finasteride 5mg  po qday  Glaucoma Cont Cosopt  Code Status: DNR Family Communication: Plan  discussed with the patient. Disposition Plan: Remains inpatient Diet:  Diet Order            Diet heart healthy/carb modified Room service appropriate? Yes; Fluid consistency: Thin  Diet effective now              Consultants: Cardiology Orthopedics Procedures:  None  Antibiotics:  Zosyn and Vanc, 11/19/2018----   Objective: Vitals:   11/20/18 0314 11/20/18 0808  BP: (!) 116/52 (!) 150/62  Pulse: (!) 57 (!) 57  Resp: 18 18  Temp: 98.5 F (36.9 C) 98.1 F (36.7 C)  SpO2: 99% 99%    Intake/Output Summary (Last 24 hours) at 11/20/2018 1314 Last data filed at 11/20/2018 0900 Gross per 24 hour  Intake 596.75 ml  Output 3280 ml  Net -2683.25 ml   Filed Weights   11/20/18 0500  Weight: 103.8 kg    Exam: Physical Exam  Constitutional: He is oriented to person, place, and time and well-developed, well-nourished, and in no distress. No distress.  Neck: Normal range of motion. Neck supple.  Cardiovascular: Normal rate and regular rhythm.  Pulmonary/Chest: Effort normal and breath sounds normal.  Abdominal: Soft. Bowel sounds are normal.  Musculoskeletal:     Comments: Right below-knee amputation stump clean and dry. Left foot on dressing, black discoloration of the great toe, pictures available in the chart.  Neurological: He is alert and oriented to person, place, and time.   Data Reviewed: Basic Metabolic Panel: Recent Labs  Lab 11/15/18 0627 11/16/18 0504 11/18/18 2123 11/19/18 0417 11/20/18 0738  NA 133* 131* 132* 132* 133*  K 3.5 3.6 3.8 3.5 4.3  CL  95* 93* 96* 96* 100  CO2 29 27 23 26  21*  GLUCOSE 123* 129* 111* 145* 98  BUN 35* 35* 31* 31* 29*  CREATININE 1.33* 1.45* 1.27* 1.38* 1.53*  CALCIUM 8.6* 8.4* 8.8* 8.7* 8.8*  MG  --   --   --   --  2.1   Liver Function Tests: Recent Labs  Lab 11/18/18 2123 11/19/18 0417  AST 33 28  ALT 25 22  ALKPHOS 47 41  BILITOT 0.7 0.6  PROT 7.1 6.5  ALBUMIN 3.3* 3.0*   No results for input(s): LIPASE,  AMYLASE in the last 168 hours. No results for input(s): AMMONIA in the last 168 hours. CBC: Recent Labs  Lab 11/16/18 0504 11/18/18 2132 11/20/18 0738  WBC 9.2 10.8* 8.2  NEUTROABS  --  5.6  --   HGB 9.8* 11.5* 10.4*  HCT 30.4* 34.9* 31.4*  MCV 93.8 92.6 91.3  PLT 256 310 220   Cardiac Enzymes: No results for input(s): CKTOTAL, CKMB, CKMBINDEX, TROPONINI in the last 168 hours. BNP (last 3 results) Recent Labs    11/09/18 1209 11/18/18 2132  BNP 258.0* 213.1*    ProBNP (last 3 results) No results for input(s): PROBNP in the last 8760 hours.  CBG: Recent Labs  Lab 11/19/18 2122 11/19/18 2331 11/20/18 0411 11/20/18 0805 11/20/18 1135  GLUCAP 153* 138* 103* 102* 117*    Micro Recent Results (from the past 240 hour(s))  Blood culture (routine x 2)     Status: None (Preliminary result)   Collection Time: 11/18/18  9:07 PM   Specimen: BLOOD RIGHT ARM  Result Value Ref Range Status   Specimen Description   Final    BLOOD RIGHT ARM Performed at [facility], [place] [address], [place], [place] [address]    Special Requests   Final    BOTTLES DRAWN AEROBIC ONLY Blood Culture adequate volume Performed at [facility] [address] [place], [place] [address]    Culture   Final    NO GROWTH 1 DAY Performed at [facility], [place] [address] [place], [place] [address]    Report Status PENDING  Incomplete  SARS CORONAVIRUS 2 (TAT 6-24 HRS) Nasopharyngeal Nasopharyngeal Swab     Status: None   Collection Time: 11/18/18 10:04 PM   Specimen: Nasopharyngeal Swab  Result Value Ref Range Status   SARS Coronavirus 2 NEGATIVE NEGATIVE Final    Comment: (NOTE) SARS-CoV-2 target nucleic acids are NOT DETECTED. The SARS-CoV-2 RNA is generally detectable in upper and lower respiratory specimens during the acute phase of infection. Negative results do not preclude SARS-CoV-2 infection, do not rule out co-infections with other pathogens, and should  not be used as the sole basis for treatment or other patient management decisions. Negative results must be combined with clinical observations, patient history, and epidemiological information. The expected result is Negative. Fact Sheet for Patients: SugarRoll.be Fact Sheet for Healthcare Providers: https://www.woods-mathews.com/ This test is not yet approved or cleared by the [place] FDA and  has been authorized for detection and/or diagnosis of SARS-CoV-2 by FDA under an Emergency Use Authorization (EUA). This EUA will remain  in effect (meaning this test can be used) for the duration of the COVID-19 declaration under Section 56 4(b)(1) of the Act, 21 U.S.C. section 360bbb-3(b)(1), unless the authorization is terminated or revoked sooner. Performed at [facility], [place] [address] [place], [place] [address]   Blood culture (routine x 2)  Status: None (Preliminary result)   Collection Time: 11/18/18 10:09 PM   Specimen: BLOOD  Result Value Ref Range Status   Specimen Description   Final    BLOOD RIGHT ANTECUBITAL Performed at [facility] [address] [place], [place] [address]    Special Requests   Final    BOTTLES DRAWN AEROBIC AND ANAEROBIC Blood Culture adequate volume Performed at [facility] [address], [place], [place] [address]    Culture   Final    NO GROWTH 1 DAY Performed at [facility], [place] [address] [place], [place] [address]    Report Status PENDING  Incomplete  Surgical pcr screen     Status: None   Collection Time: 11/19/18  5:30 PM   Specimen: Nasal Mucosa; Nasal Swab  Result Value Ref Range Status   MRSA, PCR NEGATIVE NEGATIVE Final   Staphylococcus aureus NEGATIVE NEGATIVE Final    Comment: (NOTE) The Xpert SA Assay (FDA approved for NASAL specimens in patients 31 years of age and older), is one component of a comprehensive surveillance  program. It is not intended to diagnose infection nor to guide or monitor treatment. Performed at [facility], [place] [address], [place], [place] [address]      Studies: Dg Chest 2 View  Result Date: 11/18/2018 CLINICAL DATA:  85 y.o male. Hx CHF, HTN. Recent admission for same. EXAM: CHEST - 2 VIEW COMPARISON:  Chest radiograph 11/13/2018 FINDINGS: Stable cardiomediastinal contours with enlarged heart size. Central vascular congestion. Hazy bibasilar pulmonary opacities likely reflecting combination of atelectasis and pleural fluid, superimposed infection not excluded. Moderate bilateral pleural effusions. No pneumothorax. No acute finding in the visualized skeleton. IMPRESSION: 1. Cardiomegaly with central vascular congestion. 2. Hazy bibasilar pulmonary opacities likely reflecting a combination of atelectasis and pleural fluid, superimposed infection not excluded. Moderate bilateral pleural effusions. Electronically Signed   By: Audie Pinto M.D.   On: 11/18/2018 19:44   Dg Foot 2 Views Left  Result Date: 11/18/2018 CLINICAL DATA:  85 y.o male. Black color to greater toe, redness and swelling. EXAM: LEFT FOOT - 2 VIEW COMPARISON:  None. FINDINGS: There is no evidence of fracture or dislocation. No evidence of bony destruction. Moderate scattered degenerative changes. Extensive vascular calcification. IMPRESSION: No acute osseous abnormality in the left foot. Electronically Signed   By: Audie Pinto M.D.   On: 11/18/2018 19:42    Scheduled Meds: . aspirin EC  81 mg Oral Daily  . atorvastatin  40 mg Oral Daily  . dorzolamide-timolol  1 drop Both Eyes BID  . famotidine  20 mg Oral Daily  . finasteride  5 mg Oral QPM  . furosemide  40 mg Oral BID  . insulin aspart  0-9 Units Subcutaneous Q4H  . levothyroxine  50 mcg Oral Q0600  . potassium chloride  10 mEq Oral Daily  . sodium chloride flush  3 mL Intravenous Q12H  . tamsulosin  0.4 mg Oral BID   Continuous Infusions: .  heparin 1,200 Units/hr (11/20/18 0300)  . piperacillin-tazobactam (ZOSYN)  IV 3.375 g (11/20/18 1008)  . vancomycin         Time spent: 25  minutes    Barb Merino  [facility]

## 2018-11-20 NOTE — Progress Notes (Deleted)
Patient ID: [name], male   DOB: Sep 26, 1927, 85 y.o.   MRN: [id number]

## 2018-11-20 NOTE — Progress Notes (Signed)
ANTICOAGULATION CONSULT NOTE - Initial Consult  Pharmacy Consult for heaprin Indication: PVD - on Eliquis PTA  Allergies  Allergen Reactions  . Iodinated Diagnostic Agents     Other reaction(s): Fever  . Dye Fdc Red [Red Dye] Hives  . Iodine-131 Other (See Comments)    Breakout, fever  . Iohexol      Code: RASH, Desc: PT STATES HOT ALL OVER HAD TO HAVE ICE PACKS ALL OVER HIM AND DIFF BREATHING, PT REFUSED IV DYE     Patient Measurements: Weight: 228 lb 13.4 oz (103.8 kg) Heparin Dosing Weight:  Wt 104.7 kg ABW 87.1 kg  Vital Signs: Temp: 97.8 F (36.6 C) (11/06 1431) Temp Source: Oral (11/06 1431) BP: 142/60 (11/06 1431) Pulse Rate: 61 (11/06 1431)  Labs: Recent Labs    11/18/18 2123 11/18/18 2132 11/19/18 0417 11/20/18 0738 11/20/18 1704  HGB  --  11.5*  --  10.4*  --   HCT  --  34.9*  --  31.4*  --   PLT  --  310  --  220  --   APTT  --   --   --  74* 112*  HEPARINUNFRC  --   --   --  1.80*  --   CREATININE 1.27*  --  1.38* 1.53*  --     Estimated Creatinine Clearance: 40.1 mL/min (A) (by C-G formula based on SCr of 1.53 mg/dL (H)).  Assessment: Pharmacy consulted to dose heparin as bridge therapy while PTA Eliquis for PAD is on hold for worsening ischemic changes to the left great toe.  Vascular surgery and ortho on board. PTA: apixaban 2.5 mg po BID - LD 11/5 at 1055 am.   APTT now 112 slightly high  Goal of Therapy:  Heparin level 0.3-0.7 units/ml aPTT 66-102 seconds Monitor platelets by anticoagulation protocol: Yes   Plan:  Reduce heparin to 1100 units/hr 0200 aptt hep lvl  Barth Kirks, PharmD, BCPS, BCCCP Clinical Pharmacist [phone]  Please check AMION for all [facility] numbers  11/20/2018 6:54 PM

## 2018-11-20 NOTE — Progress Notes (Signed)
Pleasant 85 year old gentleman with Left Great Toe Gangrene. He is denying any pain and is only asking for a pad for his heel.   VSS Alert ,pleasant. Dressing in place on left foot. CDI. Compartments soft.  A/P Dr. Sharol Given has discussed patient with Cardiologist. He will have an arteriogram . Plan for left Great toe amputation when this is complete

## 2018-11-20 NOTE — Progress Notes (Signed)
ANTICOAGULATION CONSULT NOTE - Initial Consult  Pharmacy Consult for heaprin Indication: PVD - on Eliquis PTA  Allergies  Allergen Reactions  . Iodinated Diagnostic Agents     Other reaction(s): Fever  . Dye Fdc Red [Red Dye] Hives  . Iodine-131 Other (See Comments)    Breakout, fever  . Iohexol      Code: RASH, Desc: PT STATES HOT ALL OVER HAD TO HAVE ICE PACKS ALL OVER HIM AND DIFF BREATHING, PT REFUSED IV DYE     Patient Measurements: Weight: 228 lb 13.4 oz (103.8 kg) Heparin Dosing Weight:  Wt 104.7 kg ABW 87.1 kg  Vital Signs: Temp: 98.1 F (36.7 C) (11/06 0808) Temp Source: Oral (11/06 0808) BP: 150/62 (11/06 0808) Pulse Rate: 57 (11/06 0808)  Labs: Recent Labs    11/18/18 2123 11/18/18 2132 11/19/18 0417 11/20/18 0738  HGB  --  11.5*  --  10.4*  HCT  --  34.9*  --  31.4*  PLT  --  310  --  220  APTT  --   --   --  74*  CREATININE 1.27*  --  1.38*  --     Estimated Creatinine Clearance: 44.5 mL/min (A) (by C-G formula based on SCr of 1.38 mg/dL (H)).   Medical History: Past Medical History:  Diagnosis Date  . Arthritis   . Borderline diabetic   . Glaucoma   . Hyperlipidemia   . Hypertension   . PVD (peripheral vascular disease) ([place])   . Sleep apnea    Cpap ordered but doesnt use    Assessment: Pharmacy consulted to dose heparin as bridge therapy while PTA Eliquis for PAD is on hold for worsening ischemic changes to the left great toe.  Vascular surgery and ortho on board. PTA: apixaban 2.5 mg po BID - LD 11/5 at 1055 am.   Initial aPTT therapeutic at 74 seconds, HL elevated d/t Eliquis dose and will continue to follow aPTT until correlated.    Goal of Therapy:  Heparin level 0.3-0.7 units/ml aPTT 66-102 seconds Monitor platelets by anticoagulation protocol: Yes   Plan:  Continue heparin gtt at 1200 units/hr F/u 8 hour confirmatory level Cards plan  Bertis Ruddy, PharmD Clinical Pharmacist Please check AMION for all [place]  numbers 11/20/2018 9:04 AM

## 2018-11-20 NOTE — Patient Outreach (Signed)
[facility]) Care Management  11/20/2018  [name] [birth year] [id number]  Medication Adherence call to [name] HIPPA Compliant Voice message left with a call back number. [name] is showing past due on Glipizide 5 mg under [facility].   [facility] Management Direct Dial [phone]  Fax [phone] [name]

## 2018-11-20 NOTE — Progress Notes (Addendum)
Progress Note  Patient Name: [name] Date of Encounter: 11/20/2018  Primary Cardiologist: Pixie Casino, MD   Subjective   No chest pain.   Inpatient Medications    Scheduled Meds: . aspirin EC  81 mg Oral Daily  . atorvastatin  40 mg Oral Daily  . dorzolamide-timolol  1 drop Both Eyes BID  . famotidine  20 mg Oral Daily  . finasteride  5 mg Oral QPM  . furosemide  40 mg Oral BID  . insulin aspart  0-9 Units Subcutaneous Q4H  . levothyroxine  50 mcg Oral Q0600  . potassium chloride  10 mEq Oral Daily  . sodium chloride flush  3 mL Intravenous Q12H  . tamsulosin  0.4 mg Oral BID   Continuous Infusions: . heparin 1,200 Units/hr (11/20/18 0300)  . piperacillin-tazobactam (ZOSYN)  IV 3.375 g (11/20/18 1008)  . vancomycin Stopped (11/19/18 2306)   PRN Meds: acetaminophen **OR** acetaminophen, ondansetron (ZOFRAN) IV   Vital Signs    Vitals:   11/19/18 2325 11/20/18 0314 11/20/18 0500 11/20/18 0808  BP: (!) 113/53 (!) 116/52  (!) 150/62  Pulse: (!) 57 (!) 57  (!) 57  Resp: 16 18  18   Temp: 98 F (36.7 C) 98.5 F (36.9 C)  98.1 F (36.7 C)  TempSrc:  Oral  Oral  SpO2: 97% 99%  99%  Weight:   103.8 kg     Intake/Output Summary (Last 24 hours) at 11/20/2018 1051 Last data filed at 11/20/2018 0900 Gross per 24 hour  Intake 723.97 ml  Output 3280 ml  Net -2556.03 ml   Last 3 Weights 11/20/2018 11/16/2018 11/15/2018  Weight (lbs) 228 lb 13.4 oz 230 lb 13.2 oz 231 lb 0.7 oz  Weight (kg) 103.8 kg 104.7 kg 104.8 kg      Telemetry    SR at rate of 60s - Personally Reviewed  ECG    N/A  Physical Exam   GEN: No acute distress.   Neck: No JVD Cardiac: RRR, 3/6 systolic murmurs, rubs, or gallops.  Respiratory:diminished breath sound GI: Soft, nontender, non-distended  MS:LLE with 1-2+ edema, left foot with dressing, Rt BKA Neuro:  Nonfocal  Psych: Normal affect   Labs    High Sensitivity Troponin:   Recent Labs  Lab 11/09/18 1209  TROPONINIHS  14      Chemistry Recent Labs  Lab 11/18/18 2123 11/19/18 0417 11/20/18 0738  NA 132* 132* 133*  K 3.8 3.5 4.3  CL 96* 96* 100  CO2 23 26 21*  GLUCOSE 111* 145* 98  BUN 31* 31* 29*  CREATININE 1.27* 1.38* 1.53*  CALCIUM 8.8* 8.7* 8.8*  PROT 7.1 6.5  --   ALBUMIN 3.3* 3.0*  --   AST 33 28  --   ALT 25 22  --   ALKPHOS 47 41  --   BILITOT 0.7 0.6  --   GFRNONAA 50* 45* 40*  GFRAA 58* 52* 46*  ANIONGAP 13 10 12      Hematology Recent Labs  Lab 11/16/18 0504 11/18/18 2132 11/20/18 0738  WBC 9.2 10.8* 8.2  RBC 3.24* 3.77* 3.44*  HGB 9.8* 11.5* 10.4*  HCT 30.4* 34.9* 31.4*  MCV 93.8 92.6 91.3  MCH 30.2 30.5 30.2  MCHC 32.2 33.0 33.1  RDW 14.6 14.4 14.6  PLT 256 310 220    BNP Recent Labs  Lab 11/18/18 2132  BNP 213.1*     DDimer No results for input(s): DDIMER in the last 168 hours.  Radiology    Dg Chest 2 View  Result Date: 11/18/2018 CLINICAL DATA:  85 y.o male. Hx CHF, HTN. Recent admission for same. EXAM: CHEST - 2 VIEW COMPARISON:  Chest radiograph 11/13/2018 FINDINGS: Stable cardiomediastinal contours with enlarged heart size. Central vascular congestion. Hazy bibasilar pulmonary opacities likely reflecting combination of atelectasis and pleural fluid, superimposed infection not excluded. Moderate bilateral pleural effusions. No pneumothorax. No acute finding in the visualized skeleton. IMPRESSION: 1. Cardiomegaly with central vascular congestion. 2. Hazy bibasilar pulmonary opacities likely reflecting a combination of atelectasis and pleural fluid, superimposed infection not excluded. Moderate bilateral pleural effusions. Electronically Signed   By: Audie Pinto M.D.   On: 11/18/2018 19:44   Dg Foot 2 Views Left  Result Date: 11/18/2018 CLINICAL DATA:  85 y.o male. Black color to greater toe, redness and swelling. EXAM: LEFT FOOT - 2 VIEW COMPARISON:  None. FINDINGS: There is no evidence of fracture or dislocation. No evidence of bony destruction.  Moderate scattered degenerative changes. Extensive vascular calcification. IMPRESSION: No acute osseous abnormality in the left foot. Electronically Signed   By: Audie Pinto M.D.   On: 11/18/2018 19:42    Cardiac Studies   Pending abdominal aortogram Monday  Patient Profile     [name] is a 85 y.o. male with a hx of HTN, HLD, PAF, OSA, CKD III, DM and PVD with stent to Lt SFA 05/2010 and after infection of Rt Gt toe with multiple rounds of ABX Dopplers with ABI of 0.42 on the Rt and follow up PV angio but eventual Rt BKA 08/2018 presented 11/18/2018 with worsening ischemic ulcer to Lt great toe with cellulitis of the left forefoot.  He was hospitalized at [facility] last week with volume overload and was diuresed over 20 pounds.  Assessment & Plan    1.  PAF - maintaining sinus rhythm. Eliquis on hold. On heparin for anticoagulation.  2. Critical Limb Ischemia and Left Great Toe Gangrene - The patient was evaluated by Dr. Gwenlyn Found yesterday for critical limb ischemia on the left with a gangrenous lesion on his left great toe. Plan for angiography and endovascular therapy for limb salvage on Monday 11/9 by Dr. Gwenlyn Found. Plan for fluids 50 cc Hr 6 hours prior.  3. CKD III - Baseline Scr of 1.1-1.3. Scr of 1.53 today. Follow closely while on lasix. Watch for overdiuresis  4. DM - Per primary team   5. Moderate AS - Mean gradient 25 mmHg by echo 08/2018.  For questions or updates, please contact [facility] Please consult [email or web address] for contact info under        SignedLeanor Kail, PA  11/20/2018, 10:51 AM      Patient seen and examined. Agree with assessment and plan.No chest pain. Telemetry reveals sinus bradycardia at 58 with first degree heart block. Eliquis on hold, on heparin.  EF 55 - 95%, Grade 1 diastolic dysfunction. Moderate AS with mean gradient 25 and peak instantaneous gradient 41 mm Hg on echo.  For PV angiography and probable endovascular  therapy for limb salvage of limb ischemia and gangrenous toe.  Continues to diurese. Cr 1.53 F/U BMET in am.   [name], MD, [facility] 11/20/2018 11:40 AM

## 2018-11-21 DIAGNOSIS — I480 Paroxysmal atrial fibrillation: Secondary | ICD-10-CM

## 2018-11-21 LAB — GLUCOSE, CAPILLARY
Glucose-Capillary: 135 mg/dL — ABNORMAL HIGH (ref 70–99)
Glucose-Capillary: 136 mg/dL — ABNORMAL HIGH (ref 70–99)
Glucose-Capillary: 143 mg/dL — ABNORMAL HIGH (ref 70–99)
Glucose-Capillary: 149 mg/dL — ABNORMAL HIGH (ref 70–99)
Glucose-Capillary: 163 mg/dL — ABNORMAL HIGH (ref 70–99)
Glucose-Capillary: 175 mg/dL — ABNORMAL HIGH (ref 70–99)

## 2018-11-21 LAB — CBC
HCT: 29.9 % — ABNORMAL LOW (ref 39.0–52.0)
Hemoglobin: 10.3 g/dL — ABNORMAL LOW (ref 13.0–17.0)
MCH: 30.8 pg (ref 26.0–34.0)
MCHC: 34.4 g/dL (ref 30.0–36.0)
MCV: 89.5 fL (ref 80.0–100.0)
Platelets: 276 10*3/uL (ref 150–400)
RBC: 3.34 MIL/uL — ABNORMAL LOW (ref 4.22–5.81)
RDW: 14.3 % (ref 11.5–15.5)
WBC: 10.5 10*3/uL (ref 4.0–10.5)
nRBC: 0 % (ref 0.0–0.2)

## 2018-11-21 LAB — BASIC METABOLIC PANEL
Anion gap: 12 (ref 5–15)
BUN: 28 mg/dL — ABNORMAL HIGH (ref 8–23)
CO2: 25 mmol/L (ref 22–32)
Calcium: 8.7 mg/dL — ABNORMAL LOW (ref 8.9–10.3)
Chloride: 94 mmol/L — ABNORMAL LOW (ref 98–111)
Creatinine, Ser: 1.71 mg/dL — ABNORMAL HIGH (ref 0.61–1.24)
GFR calc Af Amer: 40 mL/min — ABNORMAL LOW (ref >60)
GFR calc non Af Amer: 35 mL/min — ABNORMAL LOW (ref >60)
Glucose, Bld: 145 mg/dL — ABNORMAL HIGH (ref 70–99)
Potassium: 3.7 mmol/L (ref 3.5–5.1)
Sodium: 131 mmol/L — ABNORMAL LOW (ref 135–145)

## 2018-11-21 LAB — LIPID PANEL
Cholesterol: 85 mg/dL (ref 0–200)
HDL: 32 mg/dL — ABNORMAL LOW (ref >40)
LDL Cholesterol: 45 mg/dL (ref 0–99)
Total CHOL/HDL Ratio: 2.7 RATIO
Triglycerides: 41 mg/dL (ref <150)
VLDL: 8 mg/dL (ref 0–40)

## 2018-11-21 LAB — HEPARIN LEVEL (UNFRACTIONATED): Heparin Unfractionated: 1.28 IU/mL — ABNORMAL HIGH (ref 0.30–0.70)

## 2018-11-21 LAB — APTT
aPTT: 105 seconds — ABNORMAL HIGH (ref 24–36)
aPTT: 79 seconds — ABNORMAL HIGH (ref 24–36)

## 2018-11-21 MED ORDER — VANCOMYCIN HCL 10 G IV SOLR
1500.0000 mg | INTRAVENOUS | Status: DC
  Administered 2018-11-22 – 2018-11-26 (×3): 1500 mg via INTRAVENOUS
  Filled 2018-11-21 (×3): qty 1500

## 2018-11-21 NOTE — Plan of Care (Signed)

## 2018-11-21 NOTE — TOC Initial Note (Signed)
Transition of Care [facility]) - Initial/Assessment Note    Patient Details  Name: [name] MRN: [id number] Date of Birth: Apr 12, 1927  Transition of [facility]) CM/SW Contact:    Claudie Leach, RN Phone Number: 11/21/2018, 12:33 PM  Clinical Narrative:                 Pt from home with son and other adult children.  They take turns staying with patient and provide transportation and other assistance.  Patient to have vascular procedure Monday for gangrenous toe.   Patient active with [facility] RN for wound care and disease management. Pt/OT discharged patient on 10/8.  Family has all necessary DME.  Expected Discharge Plan: [place]      Expected Discharge Plan and Services Expected Discharge Plan: [place]       Living arrangements for the past 2 months: Single Family Home                   Prior Living Arrangements/Services Living arrangements for the past 2 months: Single Family Home Lives with:: Adult Children Patient language and need for interpreter reviewed:: Yes        Need for Family Participation in Patient Care: Yes (Comment) Care giver support system in place?: Yes (comment)   Criminal Activity/Legal Involvement Pertinent to Current Situation/Hospitalization: No - Comment as needed  Activities of Daily Living Home Assistive Devices/Equipment: Eyeglasses, Dentures (specify type), Hospital bed, [employer], Environmental consultant (specify type), Bedside commode/3-in-1(upper/lower dentures, front wheeled walker) ADL Screening (condition at time of admission) Patient's cognitive ability adequate to safely complete daily activities?: Yes Is the patient deaf or have difficulty hearing?: No Does the patient have difficulty seeing, even when wearing glasses/contacts?: No Does the patient have difficulty concentrating, remembering, or making decisions?: No Patient able to express need for assistance with ADLs?: Yes Does the patient have difficulty  dressing or bathing?: Yes Independently performs ADLs?: No Communication: Independent Is this a change from baseline?: Pre-admission baseline Grooming: Needs assistance Is this a change from baseline?: Pre-admission baseline Feeding: Needs assistance Is this a change from baseline?: Pre-admission baseline Bathing: Needs assistance Is this a change from baseline?: Pre-admission baseline Toileting: Dependent Is this a change from baseline?: Pre-admission baseline In/Out Bed: Dependent Is this a change from baseline?: Pre-admission baseline Walks in Home: Dependent Is this a change from baseline?: Pre-admission baseline Does the patient have difficulty walking or climbing stairs?: Yes(secondary to weakness) Weakness of Legs: Both Weakness of Arms/Hands: None   Emotional Assessment Appearance:: Appears stated age     Orientation: : Oriented to Self, Oriented to Place, Oriented to  Time   Psych Involvement: No (comment)  Admission diagnosis:  Peripheral vascular disease ([place]) [I73.9] Cellulitis of left lower extremity [L03.116] Leukocytosis, unspecified type [D72.829] Diabetic ulcer of toe of left foot associated with type 2 diabetes mellitus, unspecified ulcer stage ([place]) [J88.416, L97.529] Toe infection [L08.9] Patient Active Problem List   Diagnosis Date Noted  . Gangrene of toe of left foot ([place])   . Diabetic ulcer of toe of left foot associated with type 2 diabetes mellitus ([place])   . Cellulitis of left lower extremity   . Toe infection 11/18/2018  . Peripheral edema   . Acute on chronic diastolic CHF (congestive heart failure) ([place]) 11/09/2018  . S/P BKA (below knee amputation) unilateral, right ([place]) 10/05/2018  . Hyponatremia   . Essential hypertension   . Postoperative pain   . Pain of  left heel   . Unable to maintain weight-bearing   . Type 2 diabetes mellitus with diabetic peripheral angiopathy with gangrene ([place])   . Anemia due to acute blood loss   . Stage 3  chronic kidney disease   . Acquired absence of right leg below knee ([place]) 09/07/2018  . Gangrene of right foot ([place])   . DNR (do not resuscitate) discussion   . Goals of care, counseling/discussion   . Palliative care by specialist   . Type 2 diabetes mellitus with foot ulcer and gangrene ([place])   . Severe protein-calorie malnutrition ([place])   . Ischemic foot 08/20/2018  . Critical lower limb ischemia 08/03/2018  . Type 2 diabetes mellitus with foot ulcer, without long-term current use of insulin ([place])   . Gastroesophageal reflux disease   . Cellulitis of great toe of right foot 05/29/2018  . Atrial fibrillation, chronic   . Chest pain 07/25/2017  . PAF (paroxysmal atrial fibrillation) ([place]) 07/25/2017  . BPH (benign prostatic hyperplasia) 07/25/2017  . Shortness of breath 05/11/2017  . Hypothyroidism 05/11/2017  . Chronic diastolic CHF (congestive heart failure) ([place]) 05/11/2017  . Aortic valve stenosis 06/12/2016  . RBBB 06/12/2016  . Peripheral vascular disease ([place]) 08/04/2012  . Essential hypertension, benign 08/04/2012  . Hyperlipidemia 08/04/2012  . Dizziness 08/04/2012   PCP:  Redmond School, MD Pharmacy:   [facility] [place], [place] AT [place] [address] [place] [address] Phone: [phone] Fax: [phone]  [facility] [place], [place] - [address] [place] [address] Phone: [phone] Fax: [phone]      Readmission Risk Interventions Readmission Risk Prevention Plan 11/21/2018 11/12/2018 11/10/2018  Transportation Screening Complete - Complete  PCP or Specialist Appt within 3-5 Days - - -  Not Complete comments - - -  HRI or [place] or Home Care Consult comments - - -  Social Work Consult for [place] - - -  Medication Review ([place])  Referral to Pharmacy - Complete  PCP or Specialist appointment within 3-5 days of discharge Complete Complete Not Complete  HRI or Home Care Consult Complete - Complete  SW Recovery Care/Counseling Consult Complete - Complete  Palliative Care Screening Not Applicable - Not Complete  Skilled [place] Not Complete Not Applicable Not Complete  Some recent data might be hidden

## 2018-11-21 NOTE — Progress Notes (Signed)
[place] for Heparin  Indication: PVD - on Eliquis PTA  Allergies  Allergen Reactions  . Iodinated Diagnostic Agents     Other reaction(s): Fever  . Dye Fdc Red [Red Dye] Hives  . Iodine-131 Other (See Comments)    Breakout, fever  . Iohexol      Code: RASH, Desc: PT STATES HOT ALL OVER HAD TO HAVE ICE PACKS ALL OVER HIM AND DIFF BREATHING, PT REFUSED IV DYE     Patient Measurements: Height: 5\' 10"  (177.8 cm) Weight: 227 lb 1.2 oz (103 kg) IBW/kg (Calculated) : 73 Heparin Dosing Weight:  Wt 104.7 kg ABW 87.1 kg  Vital Signs: Temp: 98.4 F (36.9 C) (11/06 2046) Temp Source: Oral (11/06 2046) BP: 139/70 (11/06 2046) Pulse Rate: 64 (11/06 2046)  Labs: Recent Labs    11/18/18 2132 11/19/18 0417 11/20/18 0738 11/20/18 1704 11/21/18 0248  HGB 11.5*  --  10.4*  --  10.3*  HCT 34.9*  --  31.4*  --  29.9*  PLT 310  --  220  --  276  APTT  --   --  74* 112* 105*  HEPARINUNFRC  --   --  1.80*  --   --   CREATININE  --  1.38* 1.53*  --  1.71*    Estimated Creatinine Clearance: 35.2 mL/min (A) (by C-G formula based on SCr of 1.71 mg/dL (H)).   Medical History: Past Medical History:  Diagnosis Date  . Arthritis   . Borderline diabetic   . Glaucoma   . Hyperlipidemia   . Hypertension   . PVD (peripheral vascular disease) ([place])   . Sleep apnea    Cpap ordered but doesnt use    Assessment: Pharmacy consulted to dose heparin as bridge therapy while PTA Eliquis for PAD is on hold for worsening ischemic changes to the left great toe.  Vascular surgery and ortho on board. PTA: apixaban 2.5 mg po BID - LD 11/5 at 1055 am.   11/7 AM update:  APTT just above goal despite rate decrease  Goal of Therapy:  Heparin level 0.3-0.7 units/ml aPTT 66-102 seconds Monitor platelets by anticoagulation protocol: Yes   Plan:  Dec heparin to 950 units/hr Re-check aPTT at [place], PharmD, [place] Pharmacist Phone:  [phone]

## 2018-11-21 NOTE — Progress Notes (Signed)
ANTICOAGULATION CONSULT NOTE - Follow Up Consult  Pharmacy Consult for Heparin + Vancomycin Indication: PAD + L infected toe  Allergies  Allergen Reactions  . Iodinated Diagnostic Agents     Other reaction(s): Fever  . Dye Fdc Red [Red Dye] Hives  . Iodine-131 Other (See Comments)    Breakout, fever  . Iohexol      Code: RASH, Desc: PT STATES HOT ALL OVER HAD TO HAVE ICE PACKS ALL OVER HIM AND DIFF BREATHING, PT REFUSED IV DYE     Patient Measurements: Height: 5\' 10"  (177.8 cm) Weight: 227 lb 1.2 oz (103 kg) IBW/kg (Calculated) : 73  Vital Signs: Temp: 98.4 F (36.9 C) (11/07 0427) Temp Source: Oral (11/07 0427) BP: 153/64 (11/07 0427) Pulse Rate: 64 (11/07 0427)  Labs: Recent Labs    11/18/18 2132 11/19/18 0417  11/20/18 0738 11/20/18 1704 11/21/18 0248 11/21/18 1208  HGB 11.5*  --   --  10.4*  --  10.3*  --   HCT 34.9*  --   --  31.4*  --  29.9*  --   PLT 310  --   --  220  --  276  --   APTT  --   --    < > 74* 112* 105* 79*  HEPARINUNFRC  --   --   --  1.80*  --  1.28*  --   CREATININE  --  1.38*  --  1.53*  --  1.71*  --    < > = values in this interval not displayed.    Estimated Creatinine Clearance: 35.2 mL/min (A) (by C-G formula based on SCr of 1.71 mg/dL (H)).   Assessment: Anticoag: apixa 2.5 bid PTA for PAD (LD 11/5) >>UFH bridge 11/5 - aPTT 75 in goal range. Hgb 10.3 stable. Plts 276. HL elevated due to drug-lab interaction with DOAC.  ID: Diabetic foot wound with cellulitis, L toe possible gangrene (has R BKA). Left foot x-ray without evidence of osteomyelitis. Afebrile, WBC 10.5 up, Scr rising 1.71  Vancomycin 11/18/2018 >> Zosyn 11/18/2018 >>   11/5 MRSA PCR: negative 11/5 Staph Aureus: negative 11/4 BCx2>> 11/4 Covid neg  - 11/5: Vancomycin 750 mg IV Q 24 hrs. Goal AUC 400-550 - estAUC 433, Scr 1.53, Vd 0.5) - 11/7: Vancomycin 1500 mg IV Q 48 hrs. Goal AUC 400-550. Expected AUC: 493.7 SCr used: 1.71 (VD 0.5)  Goal of Therapy:   aPTT 66-102 seconds Monitor platelets by anticoagulation protocol: Yes   Plan:  Con't heparin at  950 units/hr Daily aPTT, HL, CBC Plan for PV angio on Monday, 11/9   Adjust Vancomycin to 1500mg  IV q 48 with rising Scr. Zosyn 3.375g IV Q8H infused over 4hrs. Monitor renal function and clinical improvement   Melizza Kanode S. Alford Highland, PharmD, BCPS Clinical Staff Pharmacist Eilene Ghazi [place] 11/21/2018,1:33 PM

## 2018-11-21 NOTE — Plan of Care (Signed)
  Problem: Education: Goal: Knowledge of General Education information will improve Description: Including pain rating scale, medication(s)/side effects and non-pharmacologic comfort measures 11/21/2018 2259 by Claire Shown, RN Outcome: Progressing   Problem: Activity: Goal: Risk for activity intolerance will decrease 11/21/2018 2259 by Claire Shown, RN Outcome: Progressing 11/21/2018 2247 by Claire Shown, RN Outcome: Progressing   Problem: Pain Managment: Goal: General experience of comfort will improve 11/21/2018 2259 by Claire Shown, RN Outcome: Progressing 11/21/2018 2247 by Claire Shown, RN Outcome: Progressing   Problem: Safety: Goal: Ability to remain free from injury will improve 11/21/2018 2259 by Claire Shown, RN Outcome: Progressing 11/21/2018 2247 by Claire Shown, RN Outcome: Progressing   Problem: Skin Integrity: Goal: Risk for impaired skin integrity will decrease 11/21/2018 2259 by Claire Shown, RN Outcome: Progressing 11/21/2018 2247 by Claire Shown, RN Outcome: Progressing

## 2018-11-21 NOTE — Plan of Care (Signed)
  Problem: Clinical Measurements: Goal: Will remain free from infection 11/21/2018 1326 by Stevan Born, RN Outcome: Progressing 11/21/2018 1326 by Stevan Born, RN Outcome: Progressing Goal: Cardiovascular complication will be avoided 11/21/2018 1326 by Stevan Born, RN Outcome: Progressing 11/21/2018 1326 by Stevan Born, RN Outcome: Progressing   Problem: Activity: Goal: Risk for activity intolerance will decrease 11/21/2018 1326 by Stevan Born, RN Outcome: Progressing 11/21/2018 1326 by Stevan Born, RN Outcome: Progressing   Problem: Pain Managment: Goal: General experience of comfort will improve 11/21/2018 1326 by Stevan Born, RN Outcome: Progressing 11/21/2018 1326 by Stevan Born, RN Outcome: Progressing   Problem: Safety: Goal: Ability to remain free from injury will improve 11/21/2018 1326 by Stevan Born, RN Outcome: Progressing 11/21/2018 1326 by Stevan Born, RN Outcome: Progressing   Problem: Skin Integrity: Goal: Risk for impaired skin integrity will decrease 11/21/2018 1326 by Stevan Born, RN Outcome: Progressing 11/21/2018 1326 by Stevan Born, RN Outcome: Progressing

## 2018-11-21 NOTE — Progress Notes (Signed)
Heparin decreased to 9.84ml/hr at 0445

## 2018-11-21 NOTE — Progress Notes (Signed)
Progress Note  Patient Name: [name] Date of Encounter: 11/21/2018  Primary Cardiologist: Pixie Casino, MD   Subjective   No cardiac complaints spoke with son   Inpatient Medications    Scheduled Meds: . aspirin EC  81 mg Oral Daily  . atorvastatin  40 mg Oral Daily  . collagenase   Topical Daily  . dorzolamide-timolol  1 drop Both Eyes BID  . famotidine  20 mg Oral Daily  . finasteride  5 mg Oral QPM  . insulin aspart  0-9 Units Subcutaneous Q4H  . levothyroxine  50 mcg Oral Q0600  . potassium chloride  10 mEq Oral Daily  . sodium chloride flush  3 mL Intravenous Q12H  . tamsulosin  0.4 mg Oral BID   Continuous Infusions: . heparin 950 Units/hr (11/21/18 0500)  . piperacillin-tazobactam (ZOSYN)  IV 3.375 g (11/21/18 0907)  . vancomycin Stopped (11/20/18 2258)   PRN Meds: acetaminophen **OR** acetaminophen, ondansetron (ZOFRAN) IV   Vital Signs    Vitals:   11/20/18 1431 11/20/18 2046 11/21/18 0100 11/21/18 0427  BP: (!) 142/60 139/70  (!) 153/64  Pulse: 61 64  64  Resp: 18     Temp: 97.8 F (36.6 C) 98.4 F (36.9 C)  98.4 F (36.9 C)  TempSrc: Oral Oral  Oral  SpO2: 98% 95%  97%  Weight:   103 kg   Height:   5\' 10"  (1.778 m)     Intake/Output Summary (Last 24 hours) at 11/21/2018 1031 Last data filed at 11/21/2018 0908 Gross per 24 hour  Intake 1351.31 ml  Output 3501 ml  Net -2149.69 ml   Last 3 Weights 11/21/2018 11/20/2018 11/16/2018  Weight (lbs) 227 lb 1.2 oz 228 lb 13.4 oz 230 lb 13.2 oz  Weight (kg) 103 kg 103.8 kg 104.7 kg      Telemetry    SR at rate of 60s - Personally Reviewed  ECG    N/A  Physical Exam   Affect appropriate Elderly white male  HEENT: normal Neck supple with no adenopathy JVP normal no bruits no thyromegaly Lungs clear with no wheezing and good diaphragmatic motion Heart:  S1/S2 AS murmur, no rub, gallop or click PMI normal Abdomen: benighn, BS positve, no tenderness, no AAA no bruit.  No HSM or HJR  Distal pulses intact with no bruits No edema Neuro non-focal Post right BKA and infected left great toe wrapped  No muscular weakness   Labs    High Sensitivity Troponin:   Recent Labs  Lab 11/09/18 1209  TROPONINIHS 14      Chemistry Recent Labs  Lab 11/18/18 2123 11/19/18 0417 11/20/18 0738 11/21/18 0248  NA 132* 132* 133* 131*  K 3.8 3.5 4.3 3.7  CL 96* 96* 100 94*  CO2 23 26 21* 25  GLUCOSE 111* 145* 98 145*  BUN 31* 31* 29* 28*  CREATININE 1.27* 1.38* 1.53* 1.71*  CALCIUM 8.8* 8.7* 8.8* 8.7*  PROT 7.1 6.5  --   --   ALBUMIN 3.3* 3.0*  --   --   AST 33 28  --   --   ALT 25 22  --   --   ALKPHOS 47 41  --   --   BILITOT 0.7 0.6  --   --   GFRNONAA 50* 45* 40* 35*  GFRAA 58* 52* 46* 40*  ANIONGAP 13 10 12 12      Hematology Recent Labs  Lab 11/18/18 2132 11/20/18 6389 11/21/18 0248  WBC 10.8* 8.2 10.5  RBC 3.77* 3.44* 3.34*  HGB 11.5* 10.4* 10.3*  HCT 34.9* 31.4* 29.9*  MCV 92.6 91.3 89.5  MCH 30.5 30.2 30.8  MCHC 33.0 33.1 34.4  RDW 14.4 14.6 14.3  PLT 310 220 276    BNP Recent Labs  Lab 11/18/18 2132  BNP 213.1*     DDimer No results for input(s): DDIMER in the last 168 hours.   Radiology    No results found.  Cardiac Studies   Pending abdominal aortogram Monday  Patient Profile     JAMERE STIDHAM is a 85 y.o. male with a hx of HTN, HLD, PAF, OSA, CKD III, DM and PVD with stent to Lt SFA 05/2010 and after infection of Rt Gt toe with multiple rounds of ABX Dopplers with ABI of 0.42 on the Rt and follow up PV angio but eventual Rt BKA 08/2018 presented 11/18/2018 with worsening ischemic ulcer to Lt great toe with cellulitis of the left forefoot.  He was hospitalized at [facility] last week with volume overload and was diuresed over 20 pounds.  Assessment & Plan    1.  PAF - maintaining sinus rhythm. Eliquis on hold. On heparin for anticoagulation.  2. Critical Limb Ischemia and Left Great Toe Gangrene - The patient was  evaluated by Dr. Gwenlyn Found yesterday for critical limb ischemia on the left with a gangrenous lesion on his left great toe. Plan for angiography and endovascular therapy for limb salvage on Monday 11/9 by Dr. Gwenlyn Found. Plan for fluids 50 cc Hr 6 hours prior.  3. CKD III - Cr 1.71 this am continue hydration   4. DM - Per primary team   5. Moderate AS - Mean gradient 25 mmHg by echo 08/2018.  For questions or updates, please contact [place] Please consult [email or web address] for contact info under        Signed, Jenkins Rouge, MD  11/21/2018, 10:31 AM      Patient seen and examined. Agree with assessment and plan.No chest pain. Telemetry reveals sinus bradycardia at 58 with first degree heart block. Eliquis on hold, on heparin.  EF 55 - 53%, Grade 1 diastolic dysfunction. Moderate AS with mean gradient 25 and peak instantaneous gradient 41 mm Hg on echo.  For PV angiography and probable endovascular therapy for limb salvage of limb ischemia and gangrenous toe.  Continues to diurese. Cr 1.53 F/U BMET in am.   [name], MD, [facility] 11/21/2018 10:31 AM

## 2018-11-21 NOTE — Progress Notes (Signed)
[facility] PROGRESS NOTE   [name] JXB:[id number] DOB: [birth year] DOA: 11/18/2018 PCP: Redmond School, MD  Subjective: Patient seen and examined.  No overnight events.  Not having much pain.  Patient's son at the bedside.  Brief history: [name]  is a 85 y.o. male, w hypertension, hyperlipidemia, Dm2, ckd stage3, Pafib, CHF (diastolic), PAD, s/p R BKA presents from home due to black 1st toe left foot. Pt was seen by wound care earlier in the day and sent to ER for evaluation. Slight redness of the dorsum of the left foot, and skin ulcer 2-19mm on the lateral aspect of the left foot.   Pt denies fever, chills, cough, cp, palp, sob.  In the emergency room hemodynamically stable afebrile. Left foot x-ray without evidence of osteomyelitis. Electrolytes fairly normal.  Assessment/Plan: Principal Problem:   Toe infection Active Problems:   Peripheral vascular disease (HCC)   Essential hypertension, benign   Hyperlipidemia   Aortic valve stenosis   Hypothyroidism   PAF (paroxysmal atrial fibrillation) (HCC)   Type 2 diabetes mellitus with foot ulcer, without long-term current use of insulin (HCC)   1st toe left foot dry gangrene with ischemic ulcer and cellulitis.  Blood culture negative so far. Vanco iv, Zosyn iv pharmacy to dose, continue Patient for angiogram and probable revascularization followed by toe amputation. Seen and followed by cardiology and orthopedics.  Chronic diastolic congestive heart failure: Euvolemic today.  Creatinine worsening to 1.7.  Patient is also preparing for angiogram studies.  To avoid further renal deterioration, will hold Lasix today recheck tomorrow morning.  Patient will benefit with gentle IV fluids before procedure.  PAD On aspirin and Xarelto, hold Xarelto continue aspirin Currently on heparin drip anticipation for angiogram and amputation.  Hyperlipidemia Cont Lipitor 40mg  po qhs, checking lipid panel.  Type 2 diabetes  on oral hypoglycemics at home.  Well controlled. Hold Glipizide Fsbs q4h, ISS  Hypothyroidism Cont Levothyroxine 50 micrograms po qday  BPH Cont Flomax 0.4mg  po bid Cont Finasteride 5mg  po qday  Glaucoma Cont Cosopt  Code Status: DNR Family Communication: Patient's son at the bedside. Disposition Plan: Remains inpatient Diet:  Diet Order            Diet heart healthy/carb modified Room service appropriate? Yes; Fluid consistency: Thin  Diet effective now              Consultants: Cardiology Orthopedics Procedures:  None  Antibiotics:  Zosyn and Vanc, 11/19/2018----   Objective: Vitals:   11/20/18 2046 11/21/18 0427  BP: 139/70 (!) 153/64  Pulse: 64 64  Resp:    Temp: 98.4 F (36.9 C) 98.4 F (36.9 C)  SpO2: 95% 97%    Intake/Output Summary (Last 24 hours) at 11/21/2018 1259 Last data filed at 11/21/2018 1154 Gross per 24 hour  Intake 1931.31 ml  Output 5081 ml  Net -3149.69 ml   Filed Weights   11/20/18 0500 11/21/18 0100  Weight: 103.8 kg 103 kg    Exam: Physical Exam  Constitutional: He is oriented to person, place, and time and well-developed, well-nourished, and in no distress. No distress.  Neck: Normal range of motion. Neck supple.  Cardiovascular: Normal rate and regular rhythm.  Pulmonary/Chest: Effort normal and breath sounds normal.  Abdominal: Soft. Bowel sounds are normal.  Musculoskeletal:     Comments: Right below-knee amputation stump clean and dry. Left foot on dressing, black discoloration of the great toe, pictures available in the chart.  Neurological: He is alert and oriented  to person, place, and time.   Data Reviewed: Basic Metabolic Panel: Recent Labs  Lab 11/16/18 0504 11/18/18 2123 11/19/18 0417 11/20/18 0738 11/21/18 0248  NA 131* 132* 132* 133* 131*  K 3.6 3.8 3.5 4.3 3.7  CL 93* 96* 96* 100 94*  CO2 27 23 26  21* 25  GLUCOSE 129* 111* 145* 98 145*  BUN 35* 31* 31* 29* 28*  CREATININE 1.45* 1.27* 1.38*  1.53* 1.71*  CALCIUM 8.4* 8.8* 8.7* 8.8* 8.7*  MG  --   --   --  2.1  --    Liver Function Tests: Recent Labs  Lab 11/18/18 2123 11/19/18 0417  AST 33 28  ALT 25 22  ALKPHOS 47 41  BILITOT 0.7 0.6  PROT 7.1 6.5  ALBUMIN 3.3* 3.0*   No results for input(s): LIPASE, AMYLASE in the last 168 hours. No results for input(s): AMMONIA in the last 168 hours. CBC: Recent Labs  Lab 11/16/18 0504 11/18/18 2132 11/20/18 0738 11/21/18 0248  WBC 9.2 10.8* 8.2 10.5  NEUTROABS  --  5.6  --   --   HGB 9.8* 11.5* 10.4* 10.3*  HCT 30.4* 34.9* 31.4* 29.9*  MCV 93.8 92.6 91.3 89.5  PLT 256 310 220 276   Cardiac Enzymes: No results for input(s): CKTOTAL, CKMB, CKMBINDEX, TROPONINI in the last 168 hours. BNP (last 3 results) Recent Labs    11/09/18 1209 11/18/18 2132  BNP 258.0* 213.1*    ProBNP (last 3 results) No results for input(s): PROBNP in the last 8760 hours.  CBG: Recent Labs  Lab 11/20/18 2047 11/21/18 0011 11/21/18 0428 11/21/18 0821 11/21/18 1147  GLUCAP 134* 149* 135* 175* 143*    Micro Recent Results (from the past 240 hour(s))  Blood culture (routine x 2)     Status: None (Preliminary result)   Collection Time: 11/18/18  9:07 PM   Specimen: BLOOD RIGHT ARM  Result Value Ref Range Status   Specimen Description   Final    BLOOD RIGHT ARM Performed at [facility], [place] [address], [place], [place] [address]    Special Requests   Final    BOTTLES DRAWN AEROBIC ONLY Blood Culture adequate volume Performed at [facility] [address] [place], [place] [address]    Culture   Final    NO GROWTH 2 DAYS Performed at [facility] [address] [place], [place] [address]    Report Status PENDING  Incomplete  SARS CORONAVIRUS 2 (TAT 6-24 HRS) Nasopharyngeal Nasopharyngeal Swab     Status: None   Collection Time: 11/18/18 10:04 PM   Specimen: Nasopharyngeal Swab  Result Value Ref Range Status   SARS Coronavirus 2 NEGATIVE  NEGATIVE Final    Comment: (NOTE) SARS-CoV-2 target nucleic acids are NOT DETECTED. The SARS-CoV-2 RNA is generally detectable in upper and lower respiratory specimens during the acute phase of infection. Negative results do not preclude SARS-CoV-2 infection, do not rule out co-infections with other pathogens, and should not be used as the sole basis for treatment or other patient management decisions. Negative results must be combined with clinical observations, patient history, and epidemiological information. The expected result is Negative. Fact Sheet for Patients: SugarRoll.be Fact Sheet for Healthcare Providers: https://www.woods-mathews.com/ This test is not yet approved or cleared by the [place] FDA and  has been authorized for detection and/or diagnosis of SARS-CoV-2 by FDA under an Emergency Use Authorization (EUA). This EUA will remain  in effect (meaning this test  can be used) for the duration of the COVID-19 declaration under Section 56 4(b)(1) of the Act, 21 U.S.C. section 360bbb-3(b)(1), unless the authorization is terminated or revoked sooner. Performed at [facility], [place] [address], [name], [place] [address]   Blood culture (routine x 2)     Status: None (Preliminary result)   Collection Time: 11/18/18 10:09 PM   Specimen: BLOOD  Result Value Ref Range Status   Specimen Description   Final    BLOOD RIGHT ANTECUBITAL Performed at [facility] [address], [place], [place] [address]    Special Requests   Final    BOTTLES DRAWN AEROBIC AND ANAEROBIC Blood Culture adequate volume Performed at [facility] [address] [place], [place] [address]    Culture   Final    NO GROWTH 2 DAYS Performed at [facility] [address], [place], [place] [address]    Report Status PENDING  Incomplete  Surgical pcr screen     Status: None   Collection Time: 11/19/18   5:30 PM   Specimen: Nasal Mucosa; Nasal Swab  Result Value Ref Range Status   MRSA, PCR NEGATIVE NEGATIVE Final   Staphylococcus aureus NEGATIVE NEGATIVE Final    Comment: (NOTE) The Xpert SA Assay (FDA approved for NASAL specimens in patients 62 years of age and older), is one component of a comprehensive surveillance program. It is not intended to diagnose infection nor to guide or monitor treatment. Performed at [facility], [place] [address], [place],  [address]      Studies: No results found.  Scheduled Meds: . aspirin EC  81 mg Oral Daily  . atorvastatin  40 mg Oral Daily  . collagenase   Topical Daily  . dorzolamide-timolol  1 drop Both Eyes BID  . famotidine  20 mg Oral Daily  . finasteride  5 mg Oral QPM  . insulin aspart  0-9 Units Subcutaneous Q4H  . levothyroxine  50 mcg Oral Q0600  . potassium chloride  10 mEq Oral Daily  . sodium chloride flush  3 mL Intravenous Q12H  . tamsulosin  0.4 mg Oral BID   Continuous Infusions: . heparin 950 Units/hr (11/21/18 0500)  . piperacillin-tazobactam (ZOSYN)  IV 3.375 g (11/21/18 0907)  . vancomycin Stopped (11/20/18 2258)       Time spent: 25  minutes    Barb Merino  [facility]

## 2018-11-22 LAB — CBC
HCT: 31.7 % — ABNORMAL LOW (ref 39.0–52.0)
Hemoglobin: 10.4 g/dL — ABNORMAL LOW (ref 13.0–17.0)
MCH: 30.1 pg (ref 26.0–34.0)
MCHC: 32.8 g/dL (ref 30.0–36.0)
MCV: 91.9 fL (ref 80.0–100.0)
Platelets: 289 10*3/uL (ref 150–400)
RBC: 3.45 MIL/uL — ABNORMAL LOW (ref 4.22–5.81)
RDW: 14.4 % (ref 11.5–15.5)
WBC: 8.9 10*3/uL (ref 4.0–10.5)
nRBC: 0 % (ref 0.0–0.2)

## 2018-11-22 LAB — GLUCOSE, CAPILLARY
Glucose-Capillary: 113 mg/dL — ABNORMAL HIGH (ref 70–99)
Glucose-Capillary: 113 mg/dL — ABNORMAL HIGH (ref 70–99)
Glucose-Capillary: 128 mg/dL — ABNORMAL HIGH (ref 70–99)
Glucose-Capillary: 144 mg/dL — ABNORMAL HIGH (ref 70–99)
Glucose-Capillary: 165 mg/dL — ABNORMAL HIGH (ref 70–99)
Glucose-Capillary: 212 mg/dL — ABNORMAL HIGH (ref 70–99)

## 2018-11-22 LAB — BASIC METABOLIC PANEL
Anion gap: 12 (ref 5–15)
BUN: 27 mg/dL — ABNORMAL HIGH (ref 8–23)
CO2: 26 mmol/L (ref 22–32)
Calcium: 8.8 mg/dL — ABNORMAL LOW (ref 8.9–10.3)
Chloride: 96 mmol/L — ABNORMAL LOW (ref 98–111)
Creatinine, Ser: 1.6 mg/dL — ABNORMAL HIGH (ref 0.61–1.24)
GFR calc Af Amer: 44 mL/min — ABNORMAL LOW (ref >60)
GFR calc non Af Amer: 38 mL/min — ABNORMAL LOW (ref >60)
Glucose, Bld: 114 mg/dL — ABNORMAL HIGH (ref 70–99)
Potassium: 3.8 mmol/L (ref 3.5–5.1)
Sodium: 134 mmol/L — ABNORMAL LOW (ref 135–145)

## 2018-11-22 LAB — HEPARIN LEVEL (UNFRACTIONATED): Heparin Unfractionated: 0.81 IU/mL — ABNORMAL HIGH (ref 0.30–0.70)

## 2018-11-22 LAB — APTT: aPTT: 70 seconds — ABNORMAL HIGH (ref 24–36)

## 2018-11-22 NOTE — Progress Notes (Signed)
PROGRESS NOTE    [name]  JTT:[id number] DOB: 17-Apr-1927 DOA: 11/18/2018 PCP: Redmond School, MD   Brief Narrative: 85 year old male with HTN, HLD, T2DM CKD stage III, PE AF, diastolic CHF, PAD status post right BKA admitted from home with black first toe on left foot.  In the ER x-ray showed evidence of osteomyelitis and was admitted 11/4 for further management of of dry gangrene cellulitis of first left toe. Patient has been on Vanco/Zosyn dosing per pharmacy.  Seen by cardiology, orthopedic. Recent hospitalization at any pain past week with volume overload and diuresed with over 20 pound. Subjective:  " I am doing alright" No new complaints. Alert,awake  Oriented to place, situation, date Overnight afebrile, blood pressure stable in 140s, saturating 96% to 99% on room air  Assessment & Plan:   First left toe with dry gangrene/ischemic ulcer and cellulitis in the setting of PVD-with critical limb ischemia: Remains on vancomycin, Zosyn dosing per pharmacy.  Blood culture negative so far.  Patient is for angiogram and peripheral revascularization followed by toe amputation.  Cardiology and orthopedics on board.  DOAC is on hold and patient is on heparin drip. Will cont on current plan.  Essential hypertension, benign: BP stable.  Hyperlipidemia: On Lipitor 40 nightly,  Hypothyroidism: On home levothyroxine 50 MCG.  CKD stage IIIb creatinine baseline 1-.5 -1.7. stable.  Moderate aortic stenosis- mean gradient 25 mm hg by echo 08/2018  PAF: In NSR.  On aspirin and DOAC which is on hold for now and on heparin drip.  Type 2 diabetes mellitus with foot ulcer, without long-term current use of insulin: Holding home glipizide.  Continue sliding scale insulin and monitor.  Blood sugar stable.  BPH- continue Flomax and finasteride. Body mass index is 30.91 kg/m.   DVT prophylaxis: SCD/Heparin Code Status: DNR Family Communication: plan of care discussed with patient at bedside.  Lives by himself. Has family who takes turn to help him out. Disposition Plan: Remains inpatient pending clinical improvement patient is dragging and and pending further work-up including angiogram and possible embolization.  Consultants: Orthopedic, cardiology  Procedures:NONE  Microbiology: Blood Culture negative so far  Antimicrobials: Anti-infectives (From admission, onward)   Start     Dose/Rate Route Frequency Ordered Stop   11/22/18 1000  vancomycin (VANCOCIN) 1,500 mg in sodium chloride 0.9 % 500 mL IVPB     1,500 mg 250 mL/hr over 120 Minutes Intravenous Every 48 hours 11/21/18 1331     11/20/18 2200  vancomycin (VANCOCIN) IVPB 750 mg/150 ml premix  Status:  Discontinued     750 mg 150 mL/hr over 60 Minutes Intravenous Every 24 hours 11/20/18 1143 11/21/18 1331   11/19/18 2200  vancomycin (VANCOCIN) IVPB 1000 mg/200 mL premix  Status:  Discontinued     1,000 mg 200 mL/hr over 60 Minutes Intravenous Every 24 hours 11/19/18 0445 11/20/18 1143   11/19/18 0600  piperacillin-tazobactam (ZOSYN) IVPB 3.375 g     3.375 g 12.5 mL/hr over 240 Minutes Intravenous Every 8 hours 11/19/18 0445     11/18/18 2215  vancomycin (VANCOCIN) 2,000 mg in sodium chloride 0.9 % 500 mL IVPB     2,000 mg 250 mL/hr over 120 Minutes Intravenous  Once 11/18/18 2201 11/19/18 0133   11/18/18 2215  piperacillin-tazobactam (ZOSYN) IVPB 3.375 g     3.375 g 100 mL/hr over 30 Minutes Intravenous  Once 11/18/18 2201 11/18/18 2320       Objective: Vitals:   11/21/18 1412 11/21/18 1915 11/22/18 0401  11/22/18 0500  BP: (!) 147/61 (!) 153/61 (!) 149/58   Pulse: (!) 59 100 60   Resp: 15 16 18    Temp: 97.8 F (36.6 C) 98.5 F (36.9 C) 98.6 F (37 C)   TempSrc: Oral Oral Oral   SpO2: 98% 97% 96%   Weight:    97.7 kg  Height:        Intake/Output Summary (Last 24 hours) at 11/22/2018 0803 Last data filed at 11/22/2018 0410 Gross per 24 hour  Intake 1625.4 ml  Output 2980 ml  Net -1354.6 ml   Filed  Weights   11/20/18 0500 11/21/18 0100 11/22/18 0500  Weight: 103.8 kg 103 kg 97.7 kg   Weight change: -5.3 kg  Body mass index is 30.91 kg/m.  Intake/Output from previous day: 11/07 0701 - 11/08 0700 In: 1625.4 [P.O.:1300; I.V.:223; IV Piggyback:102.4] Out: 2980 [Urine:2980] Intake/Output this shift: No intake/output data recorded.  Examination:  General exam: AAO,NAD, Weak, elderly appearing. On RA. HEENT:Oral mucosa moist, Ear/Nose WNL grossly, dentition normal. Respiratory system: Diminished at the base,no wheezing or crackles,no use of accessory muscle Cardiovascular system: S1 & S2 +, No JVD,. Gastrointestinal system: Abdomen soft, NT,ND, BS+ Nervous System:Alert, awake, moving extremities and grossly nonfocal Extremities: lt BLA, Rt distal foot in dressing-did not remove it.  Skin: No rashes,no icterus. MSK: Normal muscle bulk,tone, power  Medications:  Scheduled Meds: . aspirin EC  81 mg Oral Daily  . atorvastatin  40 mg Oral Daily  . collagenase   Topical Daily  . dorzolamide-timolol  1 drop Both Eyes BID  . famotidine  20 mg Oral Daily  . finasteride  5 mg Oral QPM  . insulin aspart  0-9 Units Subcutaneous Q4H  . levothyroxine  50 mcg Oral Q0600  . potassium chloride  10 mEq Oral Daily  . sodium chloride flush  3 mL Intravenous Q12H  . tamsulosin  0.4 mg Oral BID   Continuous Infusions: . heparin 950 Units/hr (11/22/18 0626)  . piperacillin-tazobactam (ZOSYN)  IV 3.375 g (11/22/18 0416)  . vancomycin      Data Reviewed: I have personally reviewed following labs and imaging studies  CBC: Recent Labs  Lab 11/16/18 0504 11/18/18 2132 11/20/18 0738 11/21/18 0248 11/22/18 0451  WBC 9.2 10.8* 8.2 10.5 8.9  NEUTROABS  --  5.6  --   --   --   HGB 9.8* 11.5* 10.4* 10.3* 10.4*  HCT 30.4* 34.9* 31.4* 29.9* 31.7*  MCV 93.8 92.6 91.3 89.5 91.9  PLT 256 310 220 276 102   Basic Metabolic Panel: Recent Labs  Lab 11/18/18 2123 11/19/18 0417 11/20/18 0738  11/21/18 0248 11/22/18 0451  NA 132* 132* 133* 131* 134*  K 3.8 3.5 4.3 3.7 3.8  CL 96* 96* 100 94* 96*  CO2 23 26 21* 25 26  GLUCOSE 111* 145* 98 145* 114*  BUN 31* 31* 29* 28* 27*  CREATININE 1.27* 1.38* 1.53* 1.71* 1.60*  CALCIUM 8.8* 8.7* 8.8* 8.7* 8.8*  MG  --   --  2.1  --   --    GFR: Estimated Creatinine Clearance: 36.7 mL/min (A) (by C-G formula based on SCr of 1.6 mg/dL (H)). Liver Function Tests: Recent Labs  Lab 11/18/18 2123 11/19/18 0417  AST 33 28  ALT 25 22  ALKPHOS 47 41  BILITOT 0.7 0.6  PROT 7.1 6.5  ALBUMIN 3.3* 3.0*   No results for input(s): LIPASE, AMYLASE in the last 168 hours. No results for input(s): AMMONIA in the last  168 hours. Coagulation Profile: No results for input(s): INR, PROTIME in the last 168 hours. Cardiac Enzymes: No results for input(s): CKTOTAL, CKMB, CKMBINDEX, TROPONINI in the last 168 hours. BNP (last 3 results) No results for input(s): PROBNP in the last 8760 hours. HbA1C: No results for input(s): HGBA1C in the last 72 hours. CBG: Recent Labs  Lab 11/21/18 1147 11/21/18 1646 11/21/18 2026 11/22/18 0021 11/22/18 0402  GLUCAP 143* 136* 163* 113* 113*   Lipid Profile: Recent Labs    11/21/18 0248  CHOL 85  HDL 32*  LDLCALC 45  TRIG 41  CHOLHDL 2.7   Thyroid Function Tests: No results for input(s): TSH, T4TOTAL, FREET4, T3FREE, THYROIDAB in the last 72 hours. Anemia Panel: No results for input(s): VITAMINB12, FOLATE, FERRITIN, TIBC, IRON, RETICCTPCT in the last 72 hours. Sepsis Labs: Recent Labs  Lab 11/18/18 2209 11/19/18 0023  LATICACIDVEN 1.1 1.1    Recent Results (from the past 240 hour(s))  Blood culture (routine x 2)     Status: None (Preliminary result)   Collection Time: 11/18/18  9:07 PM   Specimen: BLOOD RIGHT ARM  Result Value Ref Range Status   Specimen Description   Final    BLOOD RIGHT ARM Performed at [facility], [address] [place], [place] [address]    Special Requests    Final    BOTTLES DRAWN AEROBIC ONLY Blood Culture adequate volume Performed at [facility] [address] [place], [place] [address]    Culture   Final    NO GROWTH 3 DAYS Performed at [facility], [place] [address], [place], [place] [address]    Report Status PENDING  Incomplete  SARS CORONAVIRUS 2 (TAT 6-24 HRS) Nasopharyngeal Nasopharyngeal Swab     Status: None   Collection Time: 11/18/18 10:04 PM   Specimen: Nasopharyngeal Swab  Result Value Ref Range Status   SARS Coronavirus 2 NEGATIVE NEGATIVE Final    Comment: (NOTE) SARS-CoV-2 target nucleic acids are NOT DETECTED. The SARS-CoV-2 RNA is generally detectable in upper and lower respiratory specimens during the acute phase of infection. Negative results do not preclude SARS-CoV-2 infection, do not rule out co-infections with other pathogens, and should not be used as the sole basis for treatment or other patient management decisions. Negative results must be combined with clinical observations, patient history, and epidemiological information. The expected result is Negative. Fact Sheet for Patients: SugarRoll.be Fact Sheet for Healthcare Providers: https://www.woods-mathews.com/ This test is not yet approved or cleared by the [place] FDA and  has been authorized for detection and/or diagnosis of SARS-CoV-2 by FDA under an Emergency Use Authorization (EUA). This EUA will remain  in effect (meaning this test can be used) for the duration of the COVID-19 declaration under Section 56 4(b)(1) of the Act, 21 U.S.C. section 360bbb-3(b)(1), unless the authorization is terminated or revoked sooner. Performed at [facility], [place] [address], [place], [place] [address]   Blood culture (routine x 2)     Status: None (Preliminary result)   Collection Time: 11/18/18 10:09 PM   Specimen: BLOOD  Result Value Ref Range Status   Specimen Description    Final    BLOOD RIGHT ANTECUBITAL Performed at [facility] [address], [place], [place] [address]    Special Requests   Final    BOTTLES DRAWN AEROBIC AND ANAEROBIC Blood Culture adequate volume Performed at [facility] [address] [place], [place] [address]  Culture   Final    NO GROWTH 3 DAYS Performed at [facility], [place] [address], [place], [place] [address]    Report Status PENDING  Incomplete  Surgical pcr screen     Status: None   Collection Time: 11/19/18  5:30 PM   Specimen: Nasal Mucosa; Nasal Swab  Result Value Ref Range Status   MRSA, PCR NEGATIVE NEGATIVE Final   Staphylococcus aureus NEGATIVE NEGATIVE Final    Comment: (NOTE) The Xpert SA Assay (FDA approved for NASAL specimens in patients 7 years of age and older), is one component of a comprehensive surveillance program. It is not intended to diagnose infection nor to guide or monitor treatment. Performed at [facility], [place] [address], [place], [place] [address]       Radiology Studies: No results found.    LOS: 3 days   Time spent: More than 50% of that time was spent in counseling and/or coordination of care.  Antonieta Pert, MD [facility]  11/22/2018, 8:03 AM

## 2018-11-22 NOTE — Progress Notes (Signed)
ANTICOAGULATION CONSULT NOTE - Follow Up Consult  Pharmacy Consult for Heparin  Indication: PAD  Allergies  Allergen Reactions  . Iodinated Diagnostic Agents     Other reaction(s): Fever  . Dye Fdc Red [Red Dye] Hives  . Iodine-131 Other (See Comments)    Breakout, fever  . Iohexol      Code: RASH, Desc: PT STATES HOT ALL OVER HAD TO HAVE ICE PACKS ALL OVER HIM AND DIFF BREATHING, PT REFUSED IV DYE     Patient Measurements: Height: 5\' 10"  (177.8 cm) Weight: 215 lb 6.2 oz (97.7 kg) IBW/kg (Calculated) : 73  Vital Signs: Temp: 98.6 F (37 C) (11/08 0401) Temp Source: Oral (11/08 0401) BP: 149/58 (11/08 0401) Pulse Rate: 60 (11/08 0401)  Labs: Recent Labs    11/20/18 0738  11/21/18 0248 11/21/18 1208 11/22/18 0451  HGB 10.4*  --  10.3*  --  10.4*  HCT 31.4*  --  29.9*  --  31.7*  PLT 220  --  276  --  289  APTT 74*   < > 105* 79* 70*  HEPARINUNFRC 1.80*  --  1.28*  --  0.81*  CREATININE 1.53*  --  1.71*  --  1.60*   < > = values in this interval not displayed.    Estimated Creatinine Clearance: 36.7 mL/min (A) (by C-G formula based on SCr of 1.6 mg/dL (H)).   Assessment:  Anticoag: apixa 2.5 bid PTA for PAD (LD 11/5) >>UFH bridge 11/5 - aPTT 70 in goal range. Hgb 10.4 stable. Plts 289. HL 0.81 elevated due to drug-lab interaction with DOAC.  Goal of Therapy:  aPTT 66-102 seconds Monitor platelets by anticoagulation protocol: Yes   Plan:  Con't heparin at  950 units/hr Daily aPTT, HL, CBC Plan for PV angio on Monday, 11/9     Marguerette Sheller S. Alford Highland, PharmD, BCPS Clinical Staff Pharmacist Eilene Ghazi Stillinger 11/22/2018,8:11 AM

## 2018-11-22 NOTE — Progress Notes (Signed)
RN paged [facility] related to concern of discontinuing heparin drip pending procedure tomorrow.   Awaiting response.

## 2018-11-22 NOTE — Plan of Care (Signed)
  Problem: Safety: Goal: Ability to remain free from injury will improve Outcome: Progressing   

## 2018-11-22 NOTE — Progress Notes (Signed)
Progress Note  Patient Name: [name] Date of Encounter: 11/22/2018  Primary Cardiologist: Pixie Casino, MD   Subjective   No cardiac complaints    Inpatient Medications    Scheduled Meds: . aspirin EC  81 mg Oral Daily  . atorvastatin  40 mg Oral Daily  . collagenase   Topical Daily  . dorzolamide-timolol  1 drop Both Eyes BID  . famotidine  20 mg Oral Daily  . finasteride  5 mg Oral QPM  . insulin aspart  0-9 Units Subcutaneous Q4H  . levothyroxine  50 mcg Oral Q0600  . potassium chloride  10 mEq Oral Daily  . sodium chloride flush  3 mL Intravenous Q12H  . tamsulosin  0.4 mg Oral BID   Continuous Infusions: . heparin 950 Units/hr (11/22/18 0626)  . piperacillin-tazobactam (ZOSYN)  IV 3.375 g (11/22/18 0416)  . vancomycin     PRN Meds: acetaminophen **OR** acetaminophen, ondansetron (ZOFRAN) IV   Vital Signs    Vitals:   11/21/18 1412 11/21/18 1915 11/22/18 0401 11/22/18 0500  BP: (!) 147/61 (!) 153/61 (!) 149/58   Pulse: (!) 59 100 60   Resp: 15 16 18    Temp: 97.8 F (36.6 C) 98.5 F (36.9 C) 98.6 F (37 C)   TempSrc: Oral Oral Oral   SpO2: 98% 97% 96%   Weight:    97.7 kg  Height:        Intake/Output Summary (Last 24 hours) at 11/22/2018 1002 Last data filed at 11/22/2018 0900 Gross per 24 hour  Intake 1282.4 ml  Output 2280 ml  Net -997.6 ml   Last 3 Weights 11/22/2018 11/21/2018 11/20/2018  Weight (lbs) 215 lb 6.2 oz 227 lb 1.2 oz 228 lb 13.4 oz  Weight (kg) 97.7 kg 103 kg 103.8 kg      Telemetry    SR at rate of 60s - Personally Reviewed  ECG    N/A  Physical Exam   Affect appropriate Elderly white male  HEENT: normal Neck supple with no adenopathy JVP normal no bruits no thyromegaly Lungs clear with no wheezing and good diaphragmatic motion Heart:  S1/S2 AS murmur, no rub, gallop or click PMI normal Abdomen: benighn, BS positve, no tenderness, no AAA no bruit.  No HSM or HJR Distal pulses intact with no bruits No  edema Neuro non-focal Post right BKA and infected left great toe wrapped  No muscular weakness   Labs    High Sensitivity Troponin:   Recent Labs  Lab 11/09/18 1209  TROPONINIHS 14      Chemistry Recent Labs  Lab 11/18/18 2123 11/19/18 0417 11/20/18 0738 11/21/18 0248 11/22/18 0451  NA 132* 132* 133* 131* 134*  K 3.8 3.5 4.3 3.7 3.8  CL 96* 96* 100 94* 96*  CO2 23 26 21* 25 26  GLUCOSE 111* 145* 98 145* 114*  BUN 31* 31* 29* 28* 27*  CREATININE 1.27* 1.38* 1.53* 1.71* 1.60*  CALCIUM 8.8* 8.7* 8.8* 8.7* 8.8*  PROT 7.1 6.5  --   --   --   ALBUMIN 3.3* 3.0*  --   --   --   AST 33 28  --   --   --   ALT 25 22  --   --   --   ALKPHOS 47 41  --   --   --   BILITOT 0.7 0.6  --   --   --   GFRNONAA 50* 45* 40* 35* 38*  GFRAA  58* 52* 46* 40* 44*  ANIONGAP 13 10 12 12 12      Hematology Recent Labs  Lab 11/20/18 0738 11/21/18 0248 11/22/18 0451  WBC 8.2 10.5 8.9  RBC 3.44* 3.34* 3.45*  HGB 10.4* 10.3* 10.4*  HCT 31.4* 29.9* 31.7*  MCV 91.3 89.5 91.9  MCH 30.2 30.8 30.1  MCHC 33.1 34.4 32.8  RDW 14.6 14.3 14.4  PLT 220 276 289    BNP Recent Labs  Lab 11/18/18 2132  BNP 213.1*     DDimer No results for input(s): DDIMER in the last 168 hours.   Radiology    No results found.  Cardiac Studies   Pending abdominal aortogram Monday  Patient Profile     WILLOW RECZEK is a 85 y.o. male with a hx of HTN, HLD, PAF, OSA, CKD III, DM and PVD with stent to Lt SFA 05/2010 and after infection of Rt Gt toe with multiple rounds of ABX Dopplers with ABI of 0.42 on the Rt and follow up PV angio but eventual Rt BKA 08/2018 presented 11/18/2018 with worsening ischemic ulcer to Lt great toe with cellulitis of the left forefoot.  He was hospitalized at [facility] last week with volume overload and was diuresed over 20 pounds.  Assessment & Plan    1.  PAF - maintaining sinus rhythm. Eliquis on hold. On heparin for anticoagulation.  2. Critical Limb Ischemia  and Left Great Toe Gangrene - The patient was evaluated by Dr. Gwenlyn Found yesterday for critical limb ischemia on the left with a gangrenous lesion on his left great toe. Plan for angiography and endovascular therapy for limb salvage on Monday 11/9 by Dr. Gwenlyn Found. Plan for fluids 50 cc Hr 6 hours prior. Dr Gwenlyn Found will need to discuss with Dr Sharol Given after angiogram   3. CKD III - Cr 1.60 slightly better today   4. DM - Per primary team   5. Moderate AS - Mean gradient 25 mmHg by echo 08/2018.  For questions or updates, please contact [place] Please consult [email or web address] for contact info under        Signed, Jenkins Rouge, MD  11/22/2018, 10:02 AM

## 2018-11-22 NOTE — H&P (View-Only) (Signed)
Progress Note  Patient Name: [name] Date of Encounter: 11/22/2018  Primary Cardiologist: Pixie Casino, MD   Subjective   No cardiac complaints    Inpatient Medications    Scheduled Meds: . aspirin EC  81 mg Oral Daily  . atorvastatin  40 mg Oral Daily  . collagenase   Topical Daily  . dorzolamide-timolol  1 drop Both Eyes BID  . famotidine  20 mg Oral Daily  . finasteride  5 mg Oral QPM  . insulin aspart  0-9 Units Subcutaneous Q4H  . levothyroxine  50 mcg Oral Q0600  . potassium chloride  10 mEq Oral Daily  . sodium chloride flush  3 mL Intravenous Q12H  . tamsulosin  0.4 mg Oral BID   Continuous Infusions: . heparin 950 Units/hr (11/22/18 0626)  . piperacillin-tazobactam (ZOSYN)  IV 3.375 g (11/22/18 0416)  . vancomycin     PRN Meds: acetaminophen **OR** acetaminophen, ondansetron (ZOFRAN) IV   Vital Signs    Vitals:   11/21/18 1412 11/21/18 1915 11/22/18 0401 11/22/18 0500  BP: (!) 147/61 (!) 153/61 (!) 149/58   Pulse: (!) 59 100 60   Resp: 15 16 18    Temp: 97.8 F (36.6 C) 98.5 F (36.9 C) 98.6 F (37 C)   TempSrc: Oral Oral Oral   SpO2: 98% 97% 96%   Weight:    97.7 kg  Height:        Intake/Output Summary (Last 24 hours) at 11/22/2018 1002 Last data filed at 11/22/2018 0900 Gross per 24 hour  Intake 1282.4 ml  Output 2280 ml  Net -997.6 ml   Last 3 Weights 11/22/2018 11/21/2018 11/20/2018  Weight (lbs) 215 lb 6.2 oz 227 lb 1.2 oz 228 lb 13.4 oz  Weight (kg) 97.7 kg 103 kg 103.8 kg      Telemetry    SR at rate of 60s - Personally Reviewed  ECG    N/A  Physical Exam   Affect appropriate Elderly white male  HEENT: normal Neck supple with no adenopathy JVP normal no bruits no thyromegaly Lungs clear with no wheezing and good diaphragmatic motion Heart:  S1/S2 AS murmur, no rub, gallop or click PMI normal Abdomen: benighn, BS positve, no tenderness, no AAA no bruit.  No HSM or HJR Distal pulses intact with no bruits No  edema Neuro non-focal Post right BKA and infected left great toe wrapped  No muscular weakness   Labs    High Sensitivity Troponin:   Recent Labs  Lab 11/09/18 1209  TROPONINIHS 14      Chemistry Recent Labs  Lab 11/18/18 2123 11/19/18 0417 11/20/18 0738 11/21/18 0248 11/22/18 0451  NA 132* 132* 133* 131* 134*  K 3.8 3.5 4.3 3.7 3.8  CL 96* 96* 100 94* 96*  CO2 23 26 21* 25 26  GLUCOSE 111* 145* 98 145* 114*  BUN 31* 31* 29* 28* 27*  CREATININE 1.27* 1.38* 1.53* 1.71* 1.60*  CALCIUM 8.8* 8.7* 8.8* 8.7* 8.8*  PROT 7.1 6.5  --   --   --   ALBUMIN 3.3* 3.0*  --   --   --   AST 33 28  --   --   --   ALT 25 22  --   --   --   ALKPHOS 47 41  --   --   --   BILITOT 0.7 0.6  --   --   --   GFRNONAA 50* 45* 40* 35* 38*  GFRAA  58* 52* 46* 40* 44*  ANIONGAP 13 10 12 12 12      Hematology Recent Labs  Lab 11/20/18 0738 11/21/18 0248 11/22/18 0451  WBC 8.2 10.5 8.9  RBC 3.44* 3.34* 3.45*  HGB 10.4* 10.3* 10.4*  HCT 31.4* 29.9* 31.7*  MCV 91.3 89.5 91.9  MCH 30.2 30.8 30.1  MCHC 33.1 34.4 32.8  RDW 14.6 14.3 14.4  PLT 220 276 289    BNP Recent Labs  Lab 11/18/18 2132  BNP 213.1*     DDimer No results for input(s): DDIMER in the last 168 hours.   Radiology    No results found.  Cardiac Studies   Pending abdominal aortogram Monday  Patient Profile     [name] is a 85 y.o. male with a hx of HTN, HLD, PAF, OSA, CKD III, DM and PVD with stent to Lt SFA 05/2010 and after infection of Rt Gt toe with multiple rounds of ABX Dopplers with ABI of 0.42 on the Rt and follow up PV angio but eventual Rt BKA 08/2018 presented 11/18/2018 with worsening ischemic ulcer to Lt great toe with cellulitis of the left forefoot.  He was hospitalized at [facility] last week with volume overload and was diuresed over 20 pounds.  Assessment & Plan    1.  PAF - maintaining sinus rhythm. Eliquis on hold. On heparin for anticoagulation.  2. Critical Limb Ischemia  and Left Great Toe Gangrene - The patient was evaluated by Dr. Gwenlyn Found yesterday for critical limb ischemia on the left with a gangrenous lesion on his left great toe. Plan for angiography and endovascular therapy for limb salvage on Monday 11/9 by Dr. Gwenlyn Found. Plan for fluids 50 cc Hr 6 hours prior. Dr Gwenlyn Found will need to discuss with Dr Sharol Given after angiogram   3. CKD III - Cr 1.60 slightly better today   4. DM - Per primary team   5. Moderate AS - Mean gradient 25 mmHg by echo 08/2018.  For questions or updates, please contact [place] Please consult [email or web address] for contact info under        Signed, Jenkins Rouge, MD  11/22/2018, 10:02 AM

## 2018-11-22 NOTE — Progress Notes (Signed)
RN received return call and advised to continue heparin drip.

## 2018-11-23 ENCOUNTER — Other Ambulatory Visit: Payer: Self-pay

## 2018-11-23 ENCOUNTER — Encounter (HOSPITAL_COMMUNITY)
Admission: EM | Disposition: A | Discharge status: Rehabilitation Facility | Payer: Self-pay | Source: Home / Self Care | Attending: Internal Medicine

## 2018-11-23 ENCOUNTER — Encounter (HOSPITAL_COMMUNITY): Payer: Self-pay | Admitting: General Practice

## 2018-11-23 DIAGNOSIS — Z9889 Other specified postprocedural states: Secondary | ICD-10-CM

## 2018-11-23 DIAGNOSIS — I350 Nonrheumatic aortic (valve) stenosis: Secondary | ICD-10-CM

## 2018-11-23 DIAGNOSIS — I70213 Atherosclerosis of native arteries of extremities with intermittent claudication, bilateral legs: Secondary | ICD-10-CM

## 2018-11-23 DIAGNOSIS — I998 Other disorder of circulatory system: Secondary | ICD-10-CM

## 2018-11-23 DIAGNOSIS — I70229 Atherosclerosis of native arteries of extremities with rest pain, unspecified extremity: Secondary | ICD-10-CM

## 2018-11-23 DIAGNOSIS — Z959 Presence of cardiac and vascular implant and graft, unspecified: Secondary | ICD-10-CM

## 2018-11-23 HISTORY — PX: PERIPHERAL VASCULAR ATHERECTOMY: CATH118256

## 2018-11-23 LAB — BASIC METABOLIC PANEL
Anion gap: 11 (ref 5–15)
BUN: 24 mg/dL — ABNORMAL HIGH (ref 8–23)
CO2: 24 mmol/L (ref 22–32)
Calcium: 8.9 mg/dL (ref 8.9–10.3)
Chloride: 99 mmol/L (ref 98–111)
Creatinine, Ser: 1.57 mg/dL — ABNORMAL HIGH (ref 0.61–1.24)
GFR calc Af Amer: 45 mL/min — ABNORMAL LOW (ref >60)
GFR calc non Af Amer: 39 mL/min — ABNORMAL LOW (ref >60)
Glucose, Bld: 113 mg/dL — ABNORMAL HIGH (ref 70–99)
Potassium: 4 mmol/L (ref 3.5–5.1)
Sodium: 134 mmol/L — ABNORMAL LOW (ref 135–145)

## 2018-11-23 LAB — CBC
HCT: 32 % — ABNORMAL LOW (ref 39.0–52.0)
Hemoglobin: 10.5 g/dL — ABNORMAL LOW (ref 13.0–17.0)
MCH: 30.1 pg (ref 26.0–34.0)
MCHC: 32.8 g/dL (ref 30.0–36.0)
MCV: 91.7 fL (ref 80.0–100.0)
Platelets: 279 10*3/uL (ref 150–400)
RBC: 3.49 MIL/uL — ABNORMAL LOW (ref 4.22–5.81)
RDW: 14.3 % (ref 11.5–15.5)
WBC: 9.5 10*3/uL (ref 4.0–10.5)
nRBC: 0 % (ref 0.0–0.2)

## 2018-11-23 LAB — GLUCOSE, CAPILLARY
Glucose-Capillary: 108 mg/dL — ABNORMAL HIGH (ref 70–99)
Glucose-Capillary: 109 mg/dL — ABNORMAL HIGH (ref 70–99)
Glucose-Capillary: 112 mg/dL — ABNORMAL HIGH (ref 70–99)
Glucose-Capillary: 183 mg/dL — ABNORMAL HIGH (ref 70–99)
Glucose-Capillary: 202 mg/dL — ABNORMAL HIGH (ref 70–99)
Glucose-Capillary: 243 mg/dL — ABNORMAL HIGH (ref 70–99)

## 2018-11-23 LAB — HEPARIN LEVEL (UNFRACTIONATED): Heparin Unfractionated: 0.49 IU/mL (ref 0.30–0.70)

## 2018-11-23 LAB — APTT: aPTT: 80 seconds — ABNORMAL HIGH (ref 24–36)

## 2018-11-23 LAB — POCT ACTIVATED CLOTTING TIME
Activated Clotting Time: 301 seconds
Activated Clotting Time: 340 seconds
Activated Clotting Time: 290 seconds
Activated Clotting Time: 235 seconds
Activated Clotting Time: 202 seconds
Activated Clotting Time: 191 seconds
Activated Clotting Time: 175 seconds

## 2018-11-23 SURGERY — PERIPHERAL VASCULAR ATHERECTOMY
Anesthesia: LOCAL

## 2018-11-23 SURGERY — ABDOMINAL AORTOGRAM W/LOWER EXTREMITY
Anesthesia: LOCAL | Laterality: Left

## 2018-11-23 MED ORDER — FENTANYL CITRATE (PF) 100 MCG/2ML IJ SOLN
INTRAMUSCULAR | Status: AC
  Filled 2018-11-23: qty 2

## 2018-11-23 MED ORDER — CLOPIDOGREL BISULFATE 75 MG PO TABS
75.0000 mg | ORAL_TABLET | Freq: Every day | ORAL | Status: DC
  Administered 2018-11-24 – 2018-12-09 (×16): 75 mg via ORAL
  Filled 2018-11-23 (×17): qty 1

## 2018-11-23 MED ORDER — CLOPIDOGREL BISULFATE 300 MG PO TABS
ORAL_TABLET | ORAL | Status: AC
  Filled 2018-11-23: qty 1

## 2018-11-23 MED ORDER — MORPHINE SULFATE (PF) 2 MG/ML IV SOLN
INTRAVENOUS | Status: AC
  Filled 2018-11-23: qty 1

## 2018-11-23 MED ORDER — ASPIRIN EC 81 MG PO TBEC
81.0000 mg | DELAYED_RELEASE_TABLET | Freq: Every day | ORAL | Status: DC
  Administered 2018-11-24 – 2018-12-03 (×10): 81 mg via ORAL
  Filled 2018-11-23 (×12): qty 1

## 2018-11-23 MED ORDER — IODIXANOL 320 MG/ML IV SOLN
INTRAVENOUS | Status: DC | PRN
  Administered 2018-11-23: 160 mL

## 2018-11-23 MED ORDER — METHYLPREDNISOLONE SODIUM SUCC 125 MG IJ SOLR
125.0000 mg | Freq: Once | INTRAMUSCULAR | Status: AC
  Administered 2018-11-23: 125 mg via INTRAVENOUS
  Filled 2018-11-23: qty 2

## 2018-11-23 MED ORDER — SODIUM CHLORIDE 0.9% FLUSH
3.0000 mL | Freq: Two times a day (BID) | INTRAVENOUS | Status: DC
  Administered 2018-11-23 – 2018-11-24 (×2): 3 mL via INTRAVENOUS

## 2018-11-23 MED ORDER — DIPHENHYDRAMINE HCL 50 MG/ML IJ SOLN
25.0000 mg | Freq: Once | INTRAMUSCULAR | Status: AC
  Administered 2018-11-23: 25 mg via INTRAVENOUS
  Filled 2018-11-23: qty 1

## 2018-11-23 MED ORDER — ACETAMINOPHEN 325 MG PO TABS
ORAL_TABLET | ORAL | Status: AC
  Filled 2018-11-23: qty 2

## 2018-11-23 MED ORDER — ONDANSETRON HCL 4 MG/2ML IJ SOLN: 4.0000 mg | Freq: Four times a day (QID) | INTRAMUSCULAR | Status: DC | PRN

## 2018-11-23 MED ORDER — CLOPIDOGREL BISULFATE 300 MG PO TABS
ORAL_TABLET | ORAL | Status: DC | PRN
  Administered 2018-11-23: 300 mg via ORAL

## 2018-11-23 MED ORDER — LABETALOL HCL 5 MG/ML IV SOLN: 10.0000 mg | INTRAVENOUS | Status: DC | PRN

## 2018-11-23 MED ORDER — SODIUM CHLORIDE 0.9 % IV SOLN
250.0000 mL | INTRAVENOUS | Status: DC | PRN
  Administered 2018-11-26: 250 mL via INTRAVENOUS

## 2018-11-23 MED ORDER — HYDRALAZINE HCL 20 MG/ML IJ SOLN
INTRAMUSCULAR | Status: DC | PRN
  Administered 2018-11-23: 10 mg via INTRAVENOUS

## 2018-11-23 MED ORDER — MIDAZOLAM HCL 2 MG/2ML IJ SOLN
INTRAMUSCULAR | Status: AC
  Filled 2018-11-23: qty 2

## 2018-11-23 MED ORDER — SODIUM CHLORIDE 0.9 % IV SOLN: 250.0000 mL | INTRAVENOUS | Status: DC | PRN

## 2018-11-23 MED ORDER — NITROGLYCERIN 1 MG/10 ML FOR IR/CATH LAB
INTRA_ARTERIAL | Status: DC | PRN
  Administered 2018-11-23 (×3): 200 ug via INTRA_ARTERIAL
  Administered 2018-11-23: 200 ug
  Administered 2018-11-23: 200 ug via INTRA_ARTERIAL

## 2018-11-23 MED ORDER — MIDAZOLAM HCL 2 MG/2ML IJ SOLN
INTRAMUSCULAR | Status: DC | PRN
  Administered 2018-11-23: 0.5 mg via INTRAVENOUS

## 2018-11-23 MED ORDER — LIDOCAINE HCL (PF) 1 % IJ SOLN
INTRAMUSCULAR | Status: AC
  Filled 2018-11-23: qty 30

## 2018-11-23 MED ORDER — ACETAMINOPHEN 325 MG PO TABS
650.0000 mg | ORAL_TABLET | ORAL | Status: DC | PRN
  Administered 2018-11-25 – 2018-12-08 (×17): 650 mg via ORAL
  Filled 2018-11-23 (×18): qty 2

## 2018-11-23 MED ORDER — HEPARIN (PORCINE) IN NACL 1000-0.9 UT/500ML-% IV SOLN
INTRAVENOUS | Status: AC
  Filled 2018-11-23: qty 1000

## 2018-11-23 MED ORDER — HEPARIN SODIUM (PORCINE) 1000 UNIT/ML IJ SOLN
INTRAMUSCULAR | Status: AC
  Filled 2018-11-23: qty 1

## 2018-11-23 MED ORDER — SODIUM CHLORIDE 0.9 % IV SOLN
INTRAVENOUS | Status: DC
  Administered 2018-11-23: 50 mL/h via INTRAVENOUS

## 2018-11-23 MED ORDER — VERAPAMIL HCL 2.5 MG/ML IV SOLN
INTRAVENOUS | Status: AC
  Filled 2018-11-23: qty 2

## 2018-11-23 MED ORDER — SODIUM CHLORIDE 0.9% FLUSH: 3.0000 mL | INTRAVENOUS | Status: DC | PRN

## 2018-11-23 MED ORDER — FENTANYL CITRATE (PF) 100 MCG/2ML IJ SOLN
25.0000 ug | Freq: Once | INTRAMUSCULAR | Status: AC
  Administered 2018-11-23: 25 ug via INTRAVENOUS

## 2018-11-23 MED ORDER — LIDOCAINE HCL (PF) 1 % IJ SOLN
INTRAMUSCULAR | Status: DC | PRN
  Administered 2018-11-23: 25 mL

## 2018-11-23 MED ORDER — FENTANYL CITRATE (PF) 100 MCG/2ML IJ SOLN
INTRAMUSCULAR | Status: DC | PRN
  Administered 2018-11-23: 25 ug via INTRAVENOUS

## 2018-11-23 MED ORDER — HYDRALAZINE HCL 20 MG/ML IJ SOLN
INTRAMUSCULAR | Status: AC
  Filled 2018-11-23: qty 1

## 2018-11-23 MED ORDER — HEPARIN SODIUM (PORCINE) 1000 UNIT/ML IJ SOLN
INTRAMUSCULAR | Status: DC | PRN
  Administered 2018-11-23: 10000 [IU] via INTRAVENOUS
  Administered 2018-11-23: 2500 [IU] via INTRAVENOUS

## 2018-11-23 MED ORDER — HYDRALAZINE HCL 20 MG/ML IJ SOLN
5.0000 mg | INTRAMUSCULAR | Status: AC | PRN
  Administered 2018-11-27 – 2018-11-28 (×2): 5 mg via INTRAVENOUS
  Filled 2018-11-23 (×2): qty 1

## 2018-11-23 MED ORDER — HEPARIN (PORCINE) IN NACL 1000-0.9 UT/500ML-% IV SOLN
INTRAVENOUS | Status: DC | PRN
  Administered 2018-11-23 (×2): 500 mL

## 2018-11-23 MED ORDER — VIPERSLIDE LUBRICANT OPTIME
TOPICAL | Status: DC | PRN
  Administered 2018-11-23: 12:00:00 via SURGICAL_CAVITY

## 2018-11-23 MED ORDER — MORPHINE SULFATE (PF) 10 MG/ML IV SOLN
1.0000 mg | INTRAVENOUS | Status: DC | PRN
  Administered 2018-11-23: 1 mg via INTRAVENOUS

## 2018-11-23 MED ORDER — MORPHINE SULFATE (PF) 2 MG/ML IV SOLN
1.0000 mg | INTRAVENOUS | Status: DC | PRN
  Filled 2018-11-23: qty 1

## 2018-11-23 MED ORDER — NITROGLYCERIN IN D5W 200-5 MCG/ML-% IV SOLN
INTRAVENOUS | Status: AC
  Filled 2018-11-23: qty 250

## 2018-11-23 MED ORDER — SODIUM CHLORIDE 0.9 % IV SOLN: INTRAVENOUS | Status: AC

## 2018-11-23 MED ORDER — MORPHINE SULFATE (PF) 10 MG/ML IV SOLN
1.0000 mg | Freq: Once | INTRAVENOUS | Status: AC
  Administered 2018-11-23: 1 mg via INTRAVENOUS

## 2018-11-23 SURGICAL SUPPLY — 34 items
BALLN IN.PACT DCB 5X120 (BALLOONS) ×3
BUR DBK EXCH SYS 1.25 MIC 145 (BURR) ×2 IMPLANT
BURR DBK EXCH SYS 1.25 MIC 145 (BURR) ×3
CATH ANGIO 5F PIGTAIL 65CM (CATHETERS) ×3 IMPLANT
CATH CROSS OVER TEMPO 5F (CATHETERS) ×3 IMPLANT
CATH CXI SUPP 2.6F 150 ANG (CATHETERS) ×3 IMPLANT
CATH NAVICROSS ST .035X135CM (MICROCATHETER) ×3 IMPLANT
CATH STRAIGHT 5FR 65CM (CATHETERS) ×3 IMPLANT
CATH TELEPORT (CATHETERS) ×3 IMPLANT
CROWN 2.0 SOLID 145 DIAMONDBK (BURR) ×3 IMPLANT
DCB IN.PACT 5X120 (BALLOONS) ×2 IMPLANT
DEVICE EMBOSHIELD NAV6 4.0-7.0 (FILTER) ×3 IMPLANT
DEVICE TORQUE .014-.018 (MISCELLANEOUS) ×2 IMPLANT
GUIDEWIRE ANGLED .035X150CM (WIRE) ×3 IMPLANT
GUIDEWIRE ANGLED .035X260CM (WIRE) ×3 IMPLANT
GUIDEWIRE REGALIA .014X300CM (WIRE) ×3 IMPLANT
KIT ENCORE 26 ADVANTAGE (KITS) ×3 IMPLANT
KIT PV (KITS) ×3 IMPLANT
LUBRICANT VIPERSLIDE CORONARY (MISCELLANEOUS) ×3 IMPLANT
SHEATH HIGHFLEX ANSEL 7FR 55CM (SHEATH) ×3 IMPLANT
SHEATH PINNACLE 5F 10CM (SHEATH) ×3 IMPLANT
SHEATH PINNACLE 7F 10CM (SHEATH) ×3 IMPLANT
SHEATH PINNACLE 8F 10CM (SHEATH) ×3 IMPLANT
SHEATH PROBE COVER 6X72 (BAG) ×3 IMPLANT
SYR MEDRAD MARK 7 150ML (SYRINGE) ×3 IMPLANT
TAPE VIPERTRACK RADIOPAQ (MISCELLANEOUS) ×2 IMPLANT
TAPE VIPERTRACK RADIOPAQUE (MISCELLANEOUS) ×1
TORQUE DEVICE .014-.018 (MISCELLANEOUS) ×3
TRANSDUCER W/STOPCOCK (MISCELLANEOUS) ×3 IMPLANT
TRAY PV CATH (CUSTOM PROCEDURE TRAY) ×3 IMPLANT
WIRE HITORQ VERSACORE ST 145CM (WIRE) ×3 IMPLANT
WIRE ROSEN-J .035X180CM (WIRE) ×3 IMPLANT
WIRE VIPER ADVANCE .017X335CM (WIRE) ×3 IMPLANT
WIRE VIPER WIRECTO 0.014 (WIRE) ×3 IMPLANT

## 2018-11-23 NOTE — Progress Notes (Signed)
Site area: rt groin fa sheath Site Prior to Removal:  Level 0 Pressure Applied For: 30 minutes Manual:   yes Patient Status During Pull:  stable Post Pull Site:  Level  0 Post Pull Instructions Given:  yes Post Pull Pulses Present: rt BKA; left pt dopplered Dressing Applied:  Gauze and tegaderm Bedrest begins @ [id number] Comments:

## 2018-11-23 NOTE — Progress Notes (Signed)
Upper and Lower dentures removed and place in denture cup with pt's name on it.

## 2018-11-23 NOTE — Interval H&P Note (Signed)
History and Physical Interval Note:  11/23/2018 10:47 AM  [name]  has presented today for surgery, with the diagnosis of Critical limb ischemia.  The various methods of treatment have been discussed with the patient and family. After consideration of risks, benefits and other options for treatment, the patient has consented to  Procedure(s): ABDOMINAL AORTOGRAM W/LOWER EXTREMITY (N/A) as a surgical intervention.  The patient's history has been reviewed, patient examined, no change in status, stable for surgery.  I have reviewed the patient's chart and labs.  Questions were answered to the patient's satisfaction.     [name]

## 2018-11-23 NOTE — Progress Notes (Addendum)
Faint left posterior tibial pulse dopplered

## 2018-11-23 NOTE — Progress Notes (Signed)
Left lower leg less mottled. Dr. Burt Knack in to see patient

## 2018-11-23 NOTE — Progress Notes (Signed)
No pulse in left foot. Mottled left lower inner leg and left lower outer leg extending upwards mid calf. Dr. Gwenlyn Found here. Pain medicine ordered

## 2018-11-23 NOTE — Progress Notes (Signed)
Progress Note  Patient Name: [name] Date of Encounter: 11/23/2018  Primary Cardiologist: Pixie Casino, MD   Subjective   Resting comfortably in bed.   Inpatient Medications    Scheduled Meds: . aspirin EC  81 mg Oral Daily  . atorvastatin  40 mg Oral Daily  . collagenase   Topical Daily  . dorzolamide-timolol  1 drop Both Eyes BID  . famotidine  20 mg Oral Daily  . finasteride  5 mg Oral QPM  . insulin aspart  0-9 Units Subcutaneous Q4H  . levothyroxine  50 mcg Oral Q0600  . potassium chloride  10 mEq Oral Daily  . sodium chloride flush  3 mL Intravenous Q12H  . tamsulosin  0.4 mg Oral BID   Continuous Infusions: . sodium chloride    . sodium chloride 50 mL/hr (11/23/18 0350)  . heparin 950 Units/hr (11/22/18 0626)  . piperacillin-tazobactam (ZOSYN)  IV 3.375 g (11/23/18 0828)  . vancomycin 1,500 mg (11/22/18 1327)   PRN Meds: sodium chloride, acetaminophen **OR** acetaminophen, ondansetron (ZOFRAN) IV, sodium chloride flush   Vital Signs    Vitals:   11/22/18 1842 11/22/18 2000 11/23/18 0519 11/23/18 0823  BP: (!) 179/66 (!) 156/65 (!) 156/64 (!) 174/70  Pulse: 62 60 (!) 56 (!) 55  Resp: 16 15 18 17   Temp: 98.4 F (36.9 C) 98.3 F (36.8 C) 98.2 F (36.8 C) 97.8 F (36.6 C)  TempSrc: Oral Oral Oral Oral  SpO2: 100% 99% 95% 95%  Weight:      Height:        Intake/Output Summary (Last 24 hours) at 11/23/2018 0856 Last data filed at 11/23/2018 0519 Gross per 24 hour  Intake 940.09 ml  Output 3550 ml  Net -2609.91 ml   Last 3 Weights 11/22/2018 11/21/2018 11/20/2018  Weight (lbs) 215 lb 6.2 oz 227 lb 1.2 oz 228 lb 13.4 oz  Weight (kg) 97.7 kg 103 kg 103.8 kg      Telemetry    SR rate 50-60s -Personally reviewed  Physical Exam  Laying in bed resting comfortably.  GEN: No acute distress.   Neck: No JVD Cardiac: RRR, harsh 3/6 systolic murmur, no rubs, or gallops.  Respiratory: Clear to auscultation bilaterally. GI: Soft, nontender,  non-distended  MS: Right BKA, left foot wrapped.  Neuro:  Nonfocal  Psych: Normal affect   Labs    High Sensitivity Troponin:   Recent Labs  Lab 11/09/18 1209  TROPONINIHS 14      Chemistry Recent Labs  Lab 11/18/18 2123 11/19/18 0417  11/21/18 0248 11/22/18 0451 11/23/18 0342  NA 132* 132*   < > 131* 134* 134*  K 3.8 3.5   < > 3.7 3.8 4.0  CL 96* 96*   < > 94* 96* 99  CO2 23 26   < > 25 26 24   GLUCOSE 111* 145*   < > 145* 114* 113*  BUN 31* 31*   < > 28* 27* 24*  CREATININE 1.27* 1.38*   < > 1.71* 1.60* 1.57*  CALCIUM 8.8* 8.7*   < > 8.7* 8.8* 8.9  PROT 7.1 6.5  --   --   --   --   ALBUMIN 3.3* 3.0*  --   --   --   --   AST 33 28  --   --   --   --   ALT 25 22  --   --   --   --   ALKPHOS 47  41  --   --   --   --   BILITOT 0.7 0.6  --   --   --   --   GFRNONAA 50* 45*   < > 35* 38* 39*  GFRAA 58* 52*   < > 40* 44* 45*  ANIONGAP 13 10   < > 12 12 11    < > = values in this interval not displayed.     Hematology Recent Labs  Lab 11/21/18 0248 11/22/18 0451 11/23/18 0342  WBC 10.5 8.9 9.5  RBC 3.34* 3.45* 3.49*  HGB 10.3* 10.4* 10.5*  HCT 29.9* 31.7* 32.0*  MCV 89.5 91.9 91.7  MCH 30.8 30.1 30.1  MCHC 34.4 32.8 32.8  RDW 14.3 14.4 14.3  PLT 276 289 279    BNP Recent Labs  Lab 11/18/18 2132  BNP 213.1*     DDimer No results for input(s): DDIMER in the last 168 hours.   Radiology    No results found.  Cardiac Studies   N/a   Patient Profile     85 y.o. male a hx of HTN, HLD, PAF, OSA, CKD III, DM and PVD with stent to Lt SFA 05/2010 and after infection of Rt Gt toe with multiple rounds of ABX Dopplers with ABI of 0.42 on the Rt and follow up PV angio but eventual Rt BKA 08/2018 presented 11/18/2018 with worsening ischemic ulcer to Lt great toe with cellulitis of the left forefoot.  He was hospitalized at [facility] the week prior to admission with volume overload and was diuresed over 20 pounds.  Assessment & Plan    1. PAF:  maintaining SR. Eliquis held and on heparin with plans for PV angiogram with Dr. Gwenlyn Found today.   2. Critical limb ischemia of the left great toe: Was seen by Dr. Gwenlyn Found with plans for angiography planned today for endovascular therapy for limb salvage. Further plans post procedure.  -- Iv antibiotics per primary  3. CKD III: Cr improved to 1.5 today with gentle IVFs. No signs of volume overload.  4. Moderate AS: prominent murmur on exam. Mean gradient 25 mmHg via echo from August.   5. HTN: blood pressures are elevated but his home doxazosin held on admission via primary.    For questions or updates, please contact [place] Please consult [email or web address] for contact info under   Signed, Reino Bellis, NP  11/23/2018, 8:56 AM

## 2018-11-23 NOTE — Progress Notes (Signed)
PROGRESS NOTE    [name]  QZE:[id number] DOB: 1927-10-24 DOA: 11/18/2018 PCP: Redmond School, MD   Brief Narrative: 85 year old male with HTN, HLD, T2DM CKD stage III, PE AF, diastolic CHF, PAD status post right BKA admitted from home with black first toe on left foot.  In the ER x-ray showed evidence of osteomyelitis and was admitted 11/4 for further management of of dry gangrene cellulitis of first left toe. Patient has been on Vanco/Zosyn dosing per pharmacy.  Seen by cardiology, orthopedic. Recent hospitalization at any pain past week with volume overload and diuresed with over 20 pound. Subjective:  Patient has no new complaints. Appears mildly sleepy but could wake up and tell me his name current location and that he is waiting for procedure Overnight afebrile, blood pressure stable in the 140s to 170s, saturating 95% RA  Assessment & Plan:   First left toe with dry gangrene/ischemic ulcer and cellulitis in the setting of PVD-with critical limb ischemia: Remains on vancomycin, Zosyn dosing per pharmacy.  Blood culture negative so far.  As per cardiology he is going for aortogram with lower extremity and possible surgical intervention. Cardiology and orthopedics on board.  DOAC is on hold and patient is on heparin drip.   Essential hypertension, benign: BP stable.  Hyperlipidemia: On Lipitor 40 nightly,  Hypothyroidism: On home levothyroxine 50 MCG.  CKD stage IIIb creatinine baseline 1-.5 -1.7. stable at baseline. Recent Labs  Lab 11/19/18 0417 11/20/18 0738 11/21/18 0248 11/22/18 0451 11/23/18 0342  BUN 31* 29* 28* 27* 24*  CREATININE 1.38* 1.53* 1.71* 1.60* 1.57*   Moderate aortic stenosis- mean gradient 25 mm hg by echo 08/2018  PAF: In NSR:On aspirin and DOAC which is on hold for now and on heparin drip.  Type 2 diabetes mellitus with foot ulcer, without long-term current use of insulin: Blood sugar well controlled on 109-113. Holding home glipizide.   Continue SSI  BPH- continue Flomax and finasteride. Body mass index is 30.91 kg/m.   DVT prophylaxis: SCD/Heparin Code Status: DNR Family Communication: plan of care discussed with patient at bedside. Lives by himself. Has family who takes turn to help him out. Disposition Plan: Remains inpatient pending clinical improvement patient and further work-up IF HIS DRY GANGREN/Cellulitis and PVD.   Consultants: Orthopedic, cardiology  Procedures:NONE  Microbiology: Blood Culture negative so far  Antimicrobials: Anti-infectives (From admission, onward)   Start     Dose/Rate Route Frequency Ordered Stop   11/22/18 1000  [MAR Hold]  vancomycin (VANCOCIN) 1,500 mg in sodium chloride 0.9 % 500 mL IVPB     (MAR Hold since Mon 11/23/2018 at 1051.Hold Reason: Transfer to a Procedural area.)   1,500 mg 250 mL/hr over 120 Minutes Intravenous Every 48 hours 11/21/18 1331     11/20/18 2200  vancomycin (VANCOCIN) IVPB 750 mg/150 ml premix  Status:  Discontinued     750 mg 150 mL/hr over 60 Minutes Intravenous Every 24 hours 11/20/18 1143 11/21/18 1331   11/19/18 2200  vancomycin (VANCOCIN) IVPB 1000 mg/200 mL premix  Status:  Discontinued     1,000 mg 200 mL/hr over 60 Minutes Intravenous Every 24 hours 11/19/18 0445 11/20/18 1143   11/19/18 0600  [MAR Hold]  piperacillin-tazobactam (ZOSYN) IVPB 3.375 g     (MAR Hold since Mon 11/23/2018 at 1051.Hold Reason: Transfer to a Procedural area.)   3.375 g 12.5 mL/hr over 240 Minutes Intravenous Every 8 hours 11/19/18 0445     11/18/18 2215  vancomycin (VANCOCIN) 2,000 mg  in sodium chloride 0.9 % 500 mL IVPB     2,000 mg 250 mL/hr over 120 Minutes Intravenous  Once 11/18/18 2201 11/19/18 0133   11/18/18 2215  piperacillin-tazobactam (ZOSYN) IVPB 3.375 g     3.375 g 100 mL/hr over 30 Minutes Intravenous  Once 11/18/18 2201 11/18/18 2320       Objective: Vitals:   11/22/18 2000 11/23/18 0519 11/23/18 0823 11/23/18 1101  BP: (!) 156/65 (!) 156/64 (!)  174/70   Pulse: 60 (!) 56 (!) 55   Resp: 15 18 17    Temp: 98.3 F (36.8 C) 98.2 F (36.8 C) 97.8 F (36.6 C)   TempSrc: Oral Oral Oral   SpO2: 99% 95% 95% 100%  Weight:      Height:        Intake/Output Summary (Last 24 hours) at 11/23/2018 1126 Last data filed at 11/23/2018 0519 Gross per 24 hour  Intake 700.09 ml  Output 2550 ml  Net -1849.91 ml   Filed Weights   11/20/18 0500 11/21/18 0100 11/22/18 0500  Weight: 103.8 kg 103 kg 97.7 kg   Weight change:   Body mass index is 30.91 kg/m.  Intake/Output from previous day: 11/08 0701 - 11/09 0700 In: 940.1 [P.O.:240; I.V.:17.6; IV Piggyback:682.5] Out: 8295 [Urine:3550] Intake/Output this shift: No intake/output data recorded.  Examination:  General exam: Appears mild lethargy weak, not in distress, on room air.  HEENT:Oral mucosa moist, Ear/Nose WNL grossly, dentition normal. Respiratory system: Diminished breath sound bilaterally without wheezing crackles.  Cardiovascular system: S1 & S2 +, No JVD. Gastrointestinal system: Abdomen soft nontender nondistended bowel sounds present.   Nervous System:Alert, awake, moving extremities and grossly nonfocal. Extremities: lt BKA, Rt distal foot in dressing-did not remove dressing today he is going for procedure.  Skin: No rashes,no icterus. MSK: Normal muscle bulk,tone, power.  Medications:  Scheduled Meds:  [MAR Hold] aspirin EC  81 mg Oral Daily   [MAR Hold] atorvastatin  40 mg Oral Daily   [MAR Hold] collagenase   Topical Daily   [MAR Hold] dorzolamide-timolol  1 drop Both Eyes BID   [MAR Hold] famotidine  20 mg Oral Daily   [MAR Hold] finasteride  5 mg Oral QPM   [MAR Hold] insulin aspart  0-9 Units Subcutaneous Q4H   [MAR Hold] levothyroxine  50 mcg Oral Q0600   [MAR Hold] potassium chloride  10 mEq Oral Daily   [MAR Hold] sodium chloride flush  3 mL Intravenous Q12H   [MAR Hold] tamsulosin  0.4 mg Oral BID   Continuous Infusions:  sodium chloride      sodium chloride 50 mL/hr (11/23/18 0350)   heparin 950 Units/hr (11/22/18 0626)   [MAR Hold] piperacillin-tazobactam (ZOSYN)  IV 3.375 g (11/23/18 0828)   [MAR Hold] vancomycin 1,500 mg (11/22/18 1327)    Data Reviewed: I have personally reviewed following labs and imaging studies  CBC: Recent Labs  Lab 11/18/18 2132 11/20/18 0738 11/21/18 0248 11/22/18 0451 11/23/18 0342  WBC 10.8* 8.2 10.5 8.9 9.5  NEUTROABS 5.6  --   --   --   --   HGB 11.5* 10.4* 10.3* 10.4* 10.5*  HCT 34.9* 31.4* 29.9* 31.7* 32.0*  MCV 92.6 91.3 89.5 91.9 91.7  PLT 310 220 276 289 621   Basic Metabolic Panel: Recent Labs  Lab 11/19/18 0417 11/20/18 0738 11/21/18 0248 11/22/18 0451 11/23/18 0342  NA 132* 133* 131* 134* 134*  K 3.5 4.3 3.7 3.8 4.0  CL 96* 100 94* 96* 99  CO2 26 21* 25 26 24   GLUCOSE 145* 98 145* 114* 113*  BUN 31* 29* 28* 27* 24*  CREATININE 1.38* 1.53* 1.71* 1.60* 1.57*  CALCIUM 8.7* 8.8* 8.7* 8.8* 8.9  MG  --  2.1  --   --   --    GFR: Estimated Creatinine Clearance: 37.4 mL/min (A) (by C-G formula based on SCr of 1.57 mg/dL (H)). Liver Function Tests: Recent Labs  Lab 11/18/18 2123 11/19/18 0417  AST 33 28  ALT 25 22  ALKPHOS 47 41  BILITOT 0.7 0.6  PROT 7.1 6.5  ALBUMIN 3.3* 3.0*   No results for input(s): LIPASE, AMYLASE in the last 168 hours. No results for input(s): AMMONIA in the last 168 hours. Coagulation Profile: No results for input(s): INR, PROTIME in the last 168 hours. Cardiac Enzymes: No results for input(s): CKTOTAL, CKMB, CKMBINDEX, TROPONINI in the last 168 hours. BNP (last 3 results) No results for input(s): PROBNP in the last 8760 hours. HbA1C: No results for input(s): HGBA1C in the last 72 hours. CBG: Recent Labs  Lab 11/22/18 1647 11/22/18 2032 11/23/18 0101 11/23/18 0517 11/23/18 0824  GLUCAP 144* 212* 108* 109* 112*   Lipid Profile: Recent Labs    11/21/18 0248  CHOL 85  HDL 32*  LDLCALC 45  TRIG 41  CHOLHDL 2.7    Thyroid Function Tests: No results for input(s): TSH, T4TOTAL, FREET4, T3FREE, THYROIDAB in the last 72 hours. Anemia Panel: No results for input(s): VITAMINB12, FOLATE, FERRITIN, TIBC, IRON, RETICCTPCT in the last 72 hours. Sepsis Labs: Recent Labs  Lab 11/18/18 2209 11/19/18 0023  LATICACIDVEN 1.1 1.1    Recent Results (from the past 240 hour(s))  Blood culture (routine x 2)     Status: None (Preliminary result)   Collection Time: 11/18/18  9:07 PM   Specimen: BLOOD RIGHT ARM  Result Value Ref Range Status   Specimen Description   Final    BLOOD RIGHT ARM Performed at [facility], [address] [place], [place] [address]    Special Requests   Final    BOTTLES DRAWN AEROBIC ONLY Blood Culture adequate volume Performed at [facility] [address], [place], [place] [address]    Culture   Final    NO GROWTH 4 DAYS Performed at [facility], [place] [address], [place],  [address]    Report Status PENDING  Incomplete  SARS CORONAVIRUS 2 (TAT 6-24 HRS) Nasopharyngeal Nasopharyngeal Swab     Status: None   Collection Time: 11/18/18 10:04 PM   Specimen: Nasopharyngeal Swab  Result Value Ref Range Status   SARS Coronavirus 2 NEGATIVE NEGATIVE Final    Comment: (NOTE) SARS-CoV-2 target nucleic acids are NOT DETECTED. The SARS-CoV-2 RNA is generally detectable in upper and lower respiratory specimens during the acute phase of infection. Negative results do not preclude SARS-CoV-2 infection, do not rule out co-infections with other pathogens, and should not be used as the sole basis for treatment or other patient management decisions. Negative results must be combined with clinical observations, patient history, and epidemiological information. The expected result is Negative. Fact Sheet for Patients: SugarRoll.be Fact Sheet for Healthcare Providers: https://www.woods-mathews.com/ This test is  not yet approved or cleared by the [place] FDA and  has been authorized for detection and/or diagnosis of SARS-CoV-2 by FDA under an Emergency Use Authorization (EUA). This EUA will remain  in effect (meaning this test can be used) for the duration of the COVID-19 declaration  under Section 56 4(b)(1) of the Act, 21 U.S.C. section 360bbb-3(b)(1), unless the authorization is terminated or revoked sooner. Performed at [facility], [place] [address] [place], [place] [address]   Blood culture (routine x 2)     Status: None (Preliminary result)   Collection Time: 11/18/18 10:09 PM   Specimen: BLOOD  Result Value Ref Range Status   Specimen Description   Final    BLOOD RIGHT ANTECUBITAL Performed at [facility] [address], [place], [place] [address]    Special Requests   Final    BOTTLES DRAWN AEROBIC AND ANAEROBIC Blood Culture adequate volume Performed at [facility] [address], [place], [place] [address]    Culture   Final    NO GROWTH 4 DAYS Performed at [facility], [place] [address], [place], [place] [address]    Report Status PENDING  Incomplete  Surgical pcr screen     Status: None   Collection Time: 11/19/18  5:30 PM   Specimen: Nasal Mucosa; Nasal Swab  Result Value Ref Range Status   MRSA, PCR NEGATIVE NEGATIVE Final   Staphylococcus aureus NEGATIVE NEGATIVE Final    Comment: (NOTE) The Xpert SA Assay (FDA approved for NASAL specimens in patients 56 years of age and older), is one component of a comprehensive surveillance program. It is not intended to diagnose infection nor to guide or monitor treatment. Performed at [facility], [place] [address], [place], [place] [address]       Radiology Studies: No results found.    LOS: 4 days   Time spent: More than 50% of that time was spent in counseling and/or coordination of care.  Antonieta Pert, MD [facility]  11/23/2018, 11:26 AM

## 2018-11-24 ENCOUNTER — Encounter (HOSPITAL_COMMUNITY): Payer: Self-pay | Admitting: Cardiovascular Disease

## 2018-11-24 ENCOUNTER — Encounter: Payer: Medicare Other | Admitting: Vascular Surgery

## 2018-11-24 LAB — BASIC METABOLIC PANEL
Anion gap: 10 (ref 5–15)
BUN: 31 mg/dL — ABNORMAL HIGH (ref 8–23)
CO2: 22 mmol/L (ref 22–32)
Calcium: 8.8 mg/dL — ABNORMAL LOW (ref 8.9–10.3)
Chloride: 100 mmol/L (ref 98–111)
Creatinine, Ser: 1.43 mg/dL — ABNORMAL HIGH (ref 0.61–1.24)
GFR calc Af Amer: 50 mL/min — ABNORMAL LOW (ref >60)
GFR calc non Af Amer: 43 mL/min — ABNORMAL LOW (ref >60)
Glucose, Bld: 251 mg/dL — ABNORMAL HIGH (ref 70–99)
Potassium: 3.5 mmol/L (ref 3.5–5.1)
Sodium: 132 mmol/L — ABNORMAL LOW (ref 135–145)

## 2018-11-24 LAB — CULTURE, BLOOD (ROUTINE X 2)
Culture: NO GROWTH
Culture: NO GROWTH
Special Requests: ADEQUATE
Special Requests: ADEQUATE

## 2018-11-24 LAB — GLUCOSE, CAPILLARY
Glucose-Capillary: 162 mg/dL — ABNORMAL HIGH (ref 70–99)
Glucose-Capillary: 173 mg/dL — ABNORMAL HIGH (ref 70–99)
Glucose-Capillary: 177 mg/dL — ABNORMAL HIGH (ref 70–99)
Glucose-Capillary: 183 mg/dL — ABNORMAL HIGH (ref 70–99)
Glucose-Capillary: 208 mg/dL — ABNORMAL HIGH (ref 70–99)
Glucose-Capillary: 236 mg/dL — ABNORMAL HIGH (ref 70–99)
Glucose-Capillary: 257 mg/dL — ABNORMAL HIGH (ref 70–99)

## 2018-11-24 LAB — CBC
HCT: 29.9 % — ABNORMAL LOW (ref 39.0–52.0)
Hemoglobin: 10.3 g/dL — ABNORMAL LOW (ref 13.0–17.0)
MCH: 30.9 pg (ref 26.0–34.0)
MCHC: 34.4 g/dL (ref 30.0–36.0)
MCV: 89.8 fL (ref 80.0–100.0)
Platelets: 263 10*3/uL (ref 150–400)
RBC: 3.33 MIL/uL — ABNORMAL LOW (ref 4.22–5.81)
RDW: 14 % (ref 11.5–15.5)
WBC: 8.4 10*3/uL (ref 4.0–10.5)
nRBC: 0 % (ref 0.0–0.2)

## 2018-11-24 LAB — VANCOMYCIN, RANDOM: Vancomycin Rm: 32

## 2018-11-24 MED ORDER — METRONIDAZOLE IN NACL 5-0.79 MG/ML-% IV SOLN
500.0000 mg | Freq: Three times a day (TID) | INTRAVENOUS | Status: DC
  Administered 2018-11-24 – 2018-12-03 (×27): 500 mg via INTRAVENOUS
  Filled 2018-11-24 (×28): qty 100

## 2018-11-24 MED ORDER — SODIUM CHLORIDE 0.9 % IV SOLN
2.0000 g | INTRAVENOUS | Status: DC
  Administered 2018-11-24 – 2018-11-30 (×7): 2 g via INTRAVENOUS
  Filled 2018-11-24: qty 20
  Filled 2018-11-24: qty 2
  Filled 2018-11-24: qty 20
  Filled 2018-11-24 (×6): qty 2

## 2018-11-24 MED ORDER — SODIUM CHLORIDE 0.9 % WEIGHT BASED INFUSION
3.0000 mL/kg/h | INTRAVENOUS | Status: DC
  Administered 2018-11-25: 3 mL/kg/h via INTRAVENOUS

## 2018-11-24 MED ORDER — SODIUM CHLORIDE 0.9% FLUSH: 10.0000 mL | INTRAVENOUS | Status: DC | PRN

## 2018-11-24 MED ORDER — SODIUM CHLORIDE 0.9% FLUSH
10.0000 mL | Freq: Two times a day (BID) | INTRAVENOUS | Status: DC
  Administered 2018-11-24 – 2018-12-09 (×10): 10 mL

## 2018-11-24 MED ORDER — SODIUM CHLORIDE 0.9 % WEIGHT BASED INFUSION: 1.0000 mL/kg/h | INTRAVENOUS | Status: DC

## 2018-11-24 MED FILL — Morphine Sulfate Inj 2 MG/ML: INTRAMUSCULAR | Qty: 0.5 | Status: AC

## 2018-11-24 NOTE — Care Management Important Message (Signed)
Important Message  Patient Details  Name: [name] MRN: [id number] Date of Birth: 01/02/1928   Medicare Important Message Given:  Yes     Shelda Altes 11/24/2018, 1:28 PM

## 2018-11-24 NOTE — Progress Notes (Signed)
PROGRESS NOTE    [name]  VQM:[id number] DOB: [birth year] DOA: 11/18/2018 PCP: Redmond School, MD   Brief Narrative: 85 year old male with HTN, HLD, T2DM CKD stage III, PE AF, diastolic CHF, PAD status post right BKA admitted from home with black first toe on left foot.  In the ER x-ray showed evidence of osteomyelitis and was admitted 11/4 for further management of of dry gangrene cellulitis of first left toe. Patient has been on Vanco/Zosyn dosing per pharmacy.  Seen by cardiology, orthopedic. Recent hospitalization past week with volume overload and diuresed with over 20 pound.  Subjective:  Seen this morning. Patient denies any left leg pain. Alert awake no acute complaint.  Assessment & Plan:   First left toe with dry gangrene/ischemic ulcer and cellulitis in the setting of PVD-with critical limb ischemia left great toe: Remains on vancomycin, Zosyn dosing per pharmacy.  Blood culture negative so far.  s/p PV angiogram by Dr. Cheri Guppy a subtotal occlusion of TP trunk felt to be amenable to revascularize but unable to complete yesterday given amount of dye used 11/9- to cont on ivf and antiplatelets post  Cath-continue on dual antiplatelets.  Seen by cardiology planning for TAVR intervention 11/11 to reestablish flow to his foot. Cardiology and orthopedics on board. He is on asa, plavix and lipitor .  Essential hypertension, benign:  BP stable.  Hyperlipidemia: On Lipitor 40 nightly,  Hypothyroidism: On home levothyroxine 50 MCG.  CKD stage IIIb creatinine baseline 1-.5 -1.7. stable and improved Recent Labs  Lab 11/20/18 0738 11/21/18 0248 11/22/18 0451 11/23/18 0342 11/24/18 0328  BUN 29* 28* 27* 24* 31*  CREATININE 1.53* 1.71* 1.60* 1.57* 1.43*   Moderate aortic stenosis- mean gradient 25 mm hg by echo 08/2018  PAF: In NSR:On aspirin and DOAC which is on hold for now and on heparin drip.  Type 2 diabetes mellitus with foot ulcer, without long-term current use of  insulin: Blood sugar well controlled on 109-113. Holding home glipizide.  Continue SSI  BPH- continue Flomax and finasteride. Body mass index is 32.36 kg/m.   DVT prophylaxis: SCD/Heparin Code Status: DNR Family Communication: plan of care discussed with patient at bedside. Lives by himself. Has family who takes turn to help him out. Disposition Plan: Remains inpatient pending clinical improvement and once stable  Consultants: Orthopedic, cardiology  Procedures:NONE Peripheral art catheterizatiom 11/9 "Successful diamondback orbital rotational atherectomy, DCB using distal protection of a highly calcified subtotally occluded distal left SFA and popliteal artery in the setting of critical limb ischemia.  The dominant vessel below the knee is a peroneal.  There does seem to be a subtotally occluded tibioperoneal trunk which could be percutaneously revascularizable.  Because had use 160 cc did die and his creatinine clearance was in the 35 mL/h range I decided to terminate the procedure.  He will need to be gently hydrated overnight and treated with antiplatelet therapy.  He may ultimately really need a staged peroneal intervention".   Microbiology: Blood Culture negative so far  Antimicrobials: Anti-infectives (From admission, onward)   Start     Dose/Rate Route Frequency Ordered Stop   11/22/18 1000  vancomycin (VANCOCIN) 1,500 mg in sodium chloride 0.9 % 500 mL IVPB     1,500 mg 250 mL/hr over 120 Minutes Intravenous Every 48 hours 11/21/18 1331     11/20/18 2200  vancomycin (VANCOCIN) IVPB 750 mg/150 ml premix  Status:  Discontinued     750 mg 150 mL/hr over 60 Minutes Intravenous Every 24 hours  11/20/18 1143 11/21/18 1331   11/19/18 2200  vancomycin (VANCOCIN) IVPB 1000 mg/200 mL premix  Status:  Discontinued     1,000 mg 200 mL/hr over 60 Minutes Intravenous Every 24 hours 11/19/18 0445 11/20/18 1143   11/19/18 0600  piperacillin-tazobactam (ZOSYN) IVPB 3.375 g     3.375 g 12.5  mL/hr over 240 Minutes Intravenous Every 8 hours 11/19/18 0445     11/18/18 2215  vancomycin (VANCOCIN) 2,000 mg in sodium chloride 0.9 % 500 mL IVPB     2,000 mg 250 mL/hr over 120 Minutes Intravenous  Once 11/18/18 2201 11/19/18 0133   11/18/18 2215  piperacillin-tazobactam (ZOSYN) IVPB 3.375 g     3.375 g 100 mL/hr over 30 Minutes Intravenous  Once 11/18/18 2201 11/18/18 2320       Objective: Vitals:   11/23/18 2008 11/24/18 0001 11/24/18 0529 11/24/18 0753  BP:  (!) 118/55 (!) 124/52 (!) 129/54  Pulse:  61 (!) 57 (!) 55  Resp:  20 12 12   Temp: (!) 97.5 F (36.4 C) 98 F (36.7 C) 98.2 F (36.8 C)   TempSrc: Oral Axillary Axillary   SpO2:  95% 95% 96%  Weight:   102.3 kg   Height:        Intake/Output Summary (Last 24 hours) at 11/24/2018 0846 Last data filed at 11/24/2018 0530 Gross per 24 hour  Intake 123 ml  Output 1100 ml  Net -977 ml   Filed Weights   11/21/18 0100 11/22/18 0500 11/24/18 0529  Weight: 103 kg 97.7 kg 102.3 kg   Weight change:   Body mass index is 32.36 kg/m.  Intake/Output from previous day: 11/09 0701 - 11/10 0700 In: 123 [P.O.:120; I.V.:3] Out: 1100 [Urine:1100] Intake/Output this shift: No intake/output data recorded.  Examination:  General exam: Alert awake oriented at baseline on HEENT:Oral mucosa moist, Ear/Nose WNL grossly, dentition normal. Respiratory system: Diminished breath sound bilaterally without wheezing crackles.  Cardiovascular system: S1 & S2 +, No JVD. Gastrointestinal system: Abdomen soft NT, ND, BS present  Nervous System:Alert, awake, moving extremitie. Extremities: Rt BKA, left distal foot warm dressing intact-no mottling present patient had monitoring postop 11/9 Skin: No rashes,no icterus. MSK: Normal muscle bulk,tone, power.  Medications:  Scheduled Meds: . aspirin EC  81 mg Oral Daily  . atorvastatin  40 mg Oral Daily  . clopidogrel  75 mg Oral Q breakfast  . collagenase   Topical Daily  .  dorzolamide-timolol  1 drop Both Eyes BID  . famotidine  20 mg Oral Daily  . finasteride  5 mg Oral QPM  . insulin aspart  0-9 Units Subcutaneous Q4H  . levothyroxine  50 mcg Oral Q0600  . potassium chloride  10 mEq Oral Daily  . sodium chloride flush  3 mL Intravenous Q12H  . sodium chloride flush  3 mL Intravenous Q12H  . tamsulosin  0.4 mg Oral BID   Continuous Infusions: . sodium chloride    . piperacillin-tazobactam (ZOSYN)  IV 3.375 g (11/24/18 0105)  . vancomycin 1,500 mg (11/22/18 1327)    Data Reviewed: I have personally reviewed following labs and imaging studies  CBC: Recent Labs  Lab 11/18/18 2132 11/20/18 0738 11/21/18 0248 11/22/18 0451 11/23/18 0342 11/24/18 0328  WBC 10.8* 8.2 10.5 8.9 9.5 8.4  NEUTROABS 5.6  --   --   --   --   --   HGB 11.5* 10.4* 10.3* 10.4* 10.5* 10.3*  HCT 34.9* 31.4* 29.9* 31.7* 32.0* 29.9*  MCV 92.6 91.3  89.5 91.9 91.7 89.8  PLT 310 220 276 289 279 607   Basic Metabolic Panel: Recent Labs  Lab 11/20/18 0738 11/21/18 0248 11/22/18 0451 11/23/18 0342 11/24/18 0328  NA 133* 131* 134* 134* 132*  K 4.3 3.7 3.8 4.0 3.5  CL 100 94* 96* 99 100  CO2 21* 25 26 24 22   GLUCOSE 98 145* 114* 113* 251*  BUN 29* 28* 27* 24* 31*  CREATININE 1.53* 1.71* 1.60* 1.57* 1.43*  CALCIUM 8.8* 8.7* 8.8* 8.9 8.8*  MG 2.1  --   --   --   --    GFR: Estimated Creatinine Clearance: 42 mL/min (A) (by C-G formula based on SCr of 1.43 mg/dL (H)). Liver Function Tests: Recent Labs  Lab 11/18/18 2123 11/19/18 0417  AST 33 28  ALT 25 22  ALKPHOS 47 41  BILITOT 0.7 0.6  PROT 7.1 6.5  ALBUMIN 3.3* 3.0*   No results for input(s): LIPASE, AMYLASE in the last 168 hours. No results for input(s): AMMONIA in the last 168 hours. Coagulation Profile: No results for input(s): INR, PROTIME in the last 168 hours. Cardiac Enzymes: No results for input(s): CKTOTAL, CKMB, CKMBINDEX, TROPONINI in the last 168 hours. BNP (last 3 results) No results for  input(s): PROBNP in the last 8760 hours. HbA1C: No results for input(s): HGBA1C in the last 72 hours. CBG: Recent Labs  Lab 11/23/18 1839 11/23/18 2011 11/24/18 0000 11/24/18 0526 11/24/18 0752  GLUCAP 202* 243* 257* 236* 173*   Lipid Profile: No results for input(s): CHOL, HDL, LDLCALC, TRIG, CHOLHDL, LDLDIRECT in the last 72 hours. Thyroid Function Tests: No results for input(s): TSH, T4TOTAL, FREET4, T3FREE, THYROIDAB in the last 72 hours. Anemia Panel: No results for input(s): VITAMINB12, FOLATE, FERRITIN, TIBC, IRON, RETICCTPCT in the last 72 hours. Sepsis Labs: Recent Labs  Lab 11/18/18 2209 11/19/18 0023  LATICACIDVEN 1.1 1.1    Recent Results (from the past 240 hour(s))  Blood culture (routine x 2)     Status: None   Collection Time: 11/18/18  9:07 PM   Specimen: BLOOD RIGHT ARM  Result Value Ref Range Status   Specimen Description   Final    BLOOD RIGHT ARM Performed at [facility], [address] [place], [place] [address]    Special Requests   Final    BOTTLES DRAWN AEROBIC ONLY Blood Culture adequate volume Performed at [facility] [address] [place], [place] [address]    Culture   Final    NO GROWTH 5 DAYS Performed at [facility], [place] [address] [place], [place] [address]    Report Status 11/24/2018 FINAL  Final  SARS CORONAVIRUS 2 (TAT 6-24 HRS) Nasopharyngeal Nasopharyngeal Swab     Status: None   Collection Time: 11/18/18 10:04 PM   Specimen: Nasopharyngeal Swab  Result Value Ref Range Status   SARS Coronavirus 2 NEGATIVE NEGATIVE Final    Comment: (NOTE) SARS-CoV-2 target nucleic acids are NOT DETECTED. The SARS-CoV-2 RNA is generally detectable in upper and lower respiratory specimens during the acute phase of infection. Negative results do not preclude SARS-CoV-2 infection, do not rule out co-infections with other pathogens, and should not be used as the sole basis for treatment or other patient  management decisions. Negative results must be combined with clinical observations, patient history, and epidemiological information. The expected result is Negative. Fact Sheet for Patients: SugarRoll.be Fact Sheet for Healthcare Providers: https://www.woods-mathews.com/ This test is not yet approved or cleared by  the [employer] and  has been authorized for detection and/or diagnosis of SARS-CoV-2 by FDA under an Emergency Use Authorization (EUA). This EUA will remain  in effect (meaning this test can be used) for the duration of the COVID-19 declaration under Section 56 4(b)(1) of the Act, 21 U.S.C. section 360bbb-3(b)(1), unless the authorization is terminated or revoked sooner. Performed at [facility], [place] [address], [place], [place] [address]   Blood culture (routine x 2)     Status: None   Collection Time: 11/18/18 10:09 PM   Specimen: BLOOD  Result Value Ref Range Status   Specimen Description   Final    BLOOD RIGHT ANTECUBITAL Performed at [facility] [address], [place], [place] [address]    Special Requests   Final    BOTTLES DRAWN AEROBIC AND ANAEROBIC Blood Culture adequate volume Performed at [facility] [address] [place], [place] [address]    Culture   Final    NO GROWTH 5 DAYS Performed at [facility], [place] [address], [place], [place] [address]    Report Status 11/24/2018 FINAL  Final  Surgical pcr screen     Status: None   Collection Time: 11/19/18  5:30 PM   Specimen: Nasal Mucosa; Nasal Swab  Result Value Ref Range Status   MRSA, PCR NEGATIVE NEGATIVE Final   Staphylococcus aureus NEGATIVE NEGATIVE Final    Comment: (NOTE) The Xpert SA Assay (FDA approved for NASAL specimens in patients 28 years of age and older), is one component of a comprehensive surveillance program. It is not intended to diagnose infection nor to guide or monitor  treatment. Performed at [facility], [place] [address] [place], [place] [address]       Radiology Studies: No results found.    LOS: 5 days   Time spent: More than 50% of that time was spent in counseling and/or coordination of care.  Antonieta Pert, MD [facility]  11/24/2018, 8:46 AM

## 2018-11-24 NOTE — H&P (View-Only) (Signed)
Progress Note  Patient Name: [name] Date of Encounter: 11/24/2018  Primary Cardiologist: Pixie Casino, MD  Subjective   Feeling better this morning. No further leg pain.   Inpatient Medications    Scheduled Meds: . aspirin EC  81 mg Oral Daily  . atorvastatin  40 mg Oral Daily  . clopidogrel  75 mg Oral Q breakfast  . collagenase   Topical Daily  . dorzolamide-timolol  1 drop Both Eyes BID  . famotidine  20 mg Oral Daily  . finasteride  5 mg Oral QPM  . insulin aspart  0-9 Units Subcutaneous Q4H  . levothyroxine  50 mcg Oral Q0600  . potassium chloride  10 mEq Oral Daily  . sodium chloride flush  3 mL Intravenous Q12H  . sodium chloride flush  3 mL Intravenous Q12H  . tamsulosin  0.4 mg Oral BID   Continuous Infusions: . sodium chloride    . piperacillin-tazobactam (ZOSYN)  IV 3.375 g (11/24/18 0855)  . vancomycin 1,500 mg (11/24/18 0856)   PRN Meds: sodium chloride, acetaminophen, hydrALAZINE, labetalol, morphine injection, ondansetron (ZOFRAN) IV, sodium chloride flush   Vital Signs    Vitals:   11/23/18 2008 11/24/18 0001 11/24/18 0529 11/24/18 0753  BP:  (!) 118/55 (!) 124/52 (!) 129/54  Pulse:  61 (!) 57 (!) 55  Resp:  20 12 12   Temp: (!) 97.5 F (36.4 C) 98 F (36.7 C) 98.2 F (36.8 C)   TempSrc: Oral Axillary Axillary   SpO2:  95% 95% 96%  Weight:   102.3 kg   Height:        Intake/Output Summary (Last 24 hours) at 11/24/2018 0902 Last data filed at 11/24/2018 0530 Gross per 24 hour  Intake 123 ml  Output 1100 ml  Net -977 ml   Last 3 Weights 11/24/2018 11/22/2018 11/21/2018  Weight (lbs) 225 lb 8.5 oz 215 lb 6.2 oz 227 lb 1.2 oz  Weight (kg) 102.3 kg 97.7 kg 103 kg      Telemetry    SB - Personally Reviewed  ECG    No new tracing this morning.  Physical Exam  Pleasant older WM GEN: No acute distress.   Neck: No JVD Cardiac: RRR, 2/6 systolic murmur, no rubs, or gallops.  Respiratory: Clear to auscultation bilaterally.  GI: Soft, nontender, non-distended  MS: Right BKA, left foot wrapped. Neuro:  Nonfocal  Psych: Normal affect   Labs    High Sensitivity Troponin:   Recent Labs  Lab 11/09/18 1209  TROPONINIHS 14      Chemistry Recent Labs  Lab 11/18/18 2123 11/19/18 0417  11/22/18 0451 11/23/18 0342 11/24/18 0328  NA 132* 132*   < > 134* 134* 132*  K 3.8 3.5   < > 3.8 4.0 3.5  CL 96* 96*   < > 96* 99 100  CO2 23 26   < > 26 24 22   GLUCOSE 111* 145*   < > 114* 113* 251*  BUN 31* 31*   < > 27* 24* 31*  CREATININE 1.27* 1.38*   < > 1.60* 1.57* 1.43*  CALCIUM 8.8* 8.7*   < > 8.8* 8.9 8.8*  PROT 7.1 6.5  --   --   --   --   ALBUMIN 3.3* 3.0*  --   --   --   --   AST 33 28  --   --   --   --   ALT 25 22  --   --   --   --  ALKPHOS 47 41  --   --   --   --   BILITOT 0.7 0.6  --   --   --   --   GFRNONAA 50* 45*   < > 38* 39* 43*  GFRAA 58* 52*   < > 44* 45* 50*  ANIONGAP 13 10   < > 12 11 10    < > = values in this interval not displayed.     Hematology Recent Labs  Lab 11/22/18 0451 11/23/18 0342 11/24/18 0328  WBC 8.9 9.5 8.4  RBC 3.45* 3.49* 3.33*  HGB 10.4* 10.5* 10.3*  HCT 31.7* 32.0* 29.9*  MCV 91.9 91.7 89.8  MCH 30.1 30.1 30.9  MCHC 32.8 32.8 34.4  RDW 14.4 14.3 14.0  PLT 289 279 263    BNP Recent Labs  Lab 11/18/18 2132  BNP 213.1*     DDimer No results for input(s): DDIMER in the last 168 hours.   Radiology    No results found.  Cardiac Studies   PV angiogram: 11/23/18  Procedures Performed:               1.  Ultrasound-guided right common femoral access/contralateral access               2.  Placement of an NAV6 distal protection device below the knee left popliteal artery               3.  Diamondback orbital rotational atherectomy distal left SFA and popliteal artery               4.  DCB distal left SFA/popliteal artery  Final Impression: Successful diamondback orbital rotational atherectomy, DCB using distal protection of a highly calcified  subtotally occluded distal left SFA and popliteal artery in the setting of critical limb ischemia.  The dominant vessel below the knee is a peroneal.  There does seem to be a subtotally occluded tibioperoneal trunk which could be percutaneously revascularizable.  Because had use 160 cc did die and his creatinine clearance was in the 35 mL/h range I decided to terminate the procedure.  He will need to be gently hydrated overnight and treated with antiplatelet therapy.  He may ultimately really need a staged peroneal intervention.    [name], [facility]  Patient Profile     85 y.o. male a hx of HTN, HLD, PAF, OSA, CKD III, DM and PVD with stent to Lt SFA 05/2010 and after infection of Rt Gt toe with multiple rounds of ABX Dopplers with ABI of 0.42 on the Rt and follow up PV angio but eventual Rt BKA 08/2018 presented 11/18/2018 with worsening ischemic ulcer to Lt great toe with cellulitis of the left forefoot.  He was hospitalized at [facility] the week prior to admission with volume overload and was diuresed over 20 pounds.   Assessment & Plan    1. PAF: maintaining SR. Eliquis held along with heparin at this time. If not plans for surgery would resume IV heparin.   2. Critical limb ischemia of the left great toe: Was seen by Dr. Gwenlyn Found and underwent PV angiogram with successful diamondback rotational atherectomy to occluded lef SFA and popliteal artery. Does have a subtotal occlusion of TB trunk felt to be amenable to revascularize but unable to complete yesterday given amount of dye used for case. May need staged intervention. Seen by ortho on 11/6, unsure of timing for possible surgery at this time.  -- now on DAPT  with ASA/plavix -- IV antibiotics per primary  3. CKD III: Cr improved to 1.5>>1.43 today with gentle IVFs. No signs of volume overload.  4. Moderate AS: prominent murmur on exam. Mean gradient 25 mmHg via echo from August.   5. HTN: blood pressures stable  today.   For questions or updates, please contact [place] Please consult [email or web address] for contact info under     Signed, Reino Bellis, NP  11/24/2018, 9:02 AM    Patient seen, examined. Available data reviewed. Agree with findings, assessment, and plan as outlined by Reino Bellis, NP.  On exam, the patient is alert, oriented, in no distress.  He is an elderly male.  JVP is normal, heart is regular rate and rhythm with a 2/6 systolic murmur at the right upper sternal border, lungs are clear, groin site is clear, left leg has no edema.  The skin is no longer mottled in the left ankle and foot are warm to touch.  The foot is wrapped in a dressing.  The patient's foot pain has resolved.  He will go for tibial intervention tomorrow to try to reestablish inline flow to his foot.  I have discussed this at length with the patient and his son who is present at the bedside.  Dr. Fletcher Anon will perform the procedure.  He will be continued on dual antiplatelet therapy with aspirin and clopidogrel.  Sherren Mocha, M.D. 11/24/2018 11:05 AM

## 2018-11-24 NOTE — Progress Notes (Signed)
Inpatient Diabetes Program Recommendations  AACE/ADA: New Consensus Statement on Inpatient Glycemic Control (2015)  Target Ranges:  Prepandial:   less than 140 mg/dL      Peak postprandial:   less than 180 mg/dL (1-2 hours)      Critically ill patients:  140 - 180 mg/dL   Lab Results  Component Value Date   GLUCAP 173 (H) 11/24/2018   HGBA1C 5.9 (H) 11/09/2018    Review of Glycemic Control Results for [name], [name] (MRN [id number]) as of 11/24/2018 09:59  Ref. Range 11/23/2018 08:24 11/23/2018 13:53 11/23/2018 18:39 11/23/2018 20:11 11/24/2018 00:00 11/24/2018 05:26 11/24/2018 07:52  Glucose-Capillary Latest Ref Range: 70 - 99 mg/dL 112 (H) 183 (H) 202 (H) 243 (H) 257 (H) 236 (H) 173 (H)   Diabetes history: DM 2 Outpatient Diabetes medications: Glipizide 5 mg bid Current orders for Inpatient glycemic control:  Novolog 0-9 units Q4 hours  A1c 5.9% on 10/26  Inpatient Diabetes Program Recommendations:    Solumedrol 125 mg x1 dose given 11/9 0630 am.  Change diet to Carbohydrate Modified   Change Novolog Correction scale to 0-9 units tid and add Novolog hs scale  Thanks,  Tama Headings RN, MSN, BC-ADM Inpatient Diabetes Coordinator Team Pager [phone] (8a-5p)

## 2018-11-24 NOTE — Progress Notes (Addendum)
Progress Note  Patient Name: [name] Date of Encounter: 11/24/2018  Primary Cardiologist: Pixie Casino, MD  Subjective   Feeling better this morning. No further leg pain.   Inpatient Medications    Scheduled Meds: . aspirin EC  81 mg Oral Daily  . atorvastatin  40 mg Oral Daily  . clopidogrel  75 mg Oral Q breakfast  . collagenase   Topical Daily  . dorzolamide-timolol  1 drop Both Eyes BID  . famotidine  20 mg Oral Daily  . finasteride  5 mg Oral QPM  . insulin aspart  0-9 Units Subcutaneous Q4H  . levothyroxine  50 mcg Oral Q0600  . potassium chloride  10 mEq Oral Daily  . sodium chloride flush  3 mL Intravenous Q12H  . sodium chloride flush  3 mL Intravenous Q12H  . tamsulosin  0.4 mg Oral BID   Continuous Infusions: . sodium chloride    . piperacillin-tazobactam (ZOSYN)  IV 3.375 g (11/24/18 0855)  . vancomycin 1,500 mg (11/24/18 0856)   PRN Meds: sodium chloride, acetaminophen, hydrALAZINE, labetalol, morphine injection, ondansetron (ZOFRAN) IV, sodium chloride flush   Vital Signs    Vitals:   11/23/18 2008 11/24/18 0001 11/24/18 0529 11/24/18 0753  BP:  (!) 118/55 (!) 124/52 (!) 129/54  Pulse:  61 (!) 57 (!) 55  Resp:  20 12 12   Temp: (!) 97.5 F (36.4 C) 98 F (36.7 C) 98.2 F (36.8 C)   TempSrc: Oral Axillary Axillary   SpO2:  95% 95% 96%  Weight:   102.3 kg   Height:        Intake/Output Summary (Last 24 hours) at 11/24/2018 0902 Last data filed at 11/24/2018 0530 Gross per 24 hour  Intake 123 ml  Output 1100 ml  Net -977 ml   Last 3 Weights 11/24/2018 11/22/2018 11/21/2018  Weight (lbs) 225 lb 8.5 oz 215 lb 6.2 oz 227 lb 1.2 oz  Weight (kg) 102.3 kg 97.7 kg 103 kg      Telemetry    SB - Personally Reviewed  ECG    No new tracing this morning.  Physical Exam  Pleasant older WM GEN: No acute distress.   Neck: No JVD Cardiac: RRR, 2/6 systolic murmur, no rubs, or gallops.  Respiratory: Clear to auscultation bilaterally.  GI: Soft, nontender, non-distended  MS: Right BKA, left foot wrapped. Neuro:  Nonfocal  Psych: Normal affect   Labs    High Sensitivity Troponin:   Recent Labs  Lab 11/09/18 1209  TROPONINIHS 14      Chemistry Recent Labs  Lab 11/18/18 2123 11/19/18 0417  11/22/18 0451 11/23/18 0342 11/24/18 0328  NA 132* 132*   < > 134* 134* 132*  K 3.8 3.5   < > 3.8 4.0 3.5  CL 96* 96*   < > 96* 99 100  CO2 23 26   < > 26 24 22   GLUCOSE 111* 145*   < > 114* 113* 251*  BUN 31* 31*   < > 27* 24* 31*  CREATININE 1.27* 1.38*   < > 1.60* 1.57* 1.43*  CALCIUM 8.8* 8.7*   < > 8.8* 8.9 8.8*  PROT 7.1 6.5  --   --   --   --   ALBUMIN 3.3* 3.0*  --   --   --   --   AST 33 28  --   --   --   --   ALT 25 22  --   --   --   --  ALKPHOS 47 41  --   --   --   --   BILITOT 0.7 0.6  --   --   --   --   GFRNONAA 50* 45*   < > 38* 39* 43*  GFRAA 58* 52*   < > 44* 45* 50*  ANIONGAP 13 10   < > 12 11 10    < > = values in this interval not displayed.     Hematology Recent Labs  Lab 11/22/18 0451 11/23/18 0342 11/24/18 0328  WBC 8.9 9.5 8.4  RBC 3.45* 3.49* 3.33*  HGB 10.4* 10.5* 10.3*  HCT 31.7* 32.0* 29.9*  MCV 91.9 91.7 89.8  MCH 30.1 30.1 30.9  MCHC 32.8 32.8 34.4  RDW 14.4 14.3 14.0  PLT 289 279 263    BNP Recent Labs  Lab 11/18/18 2132  BNP 213.1*     DDimer No results for input(s): DDIMER in the last 168 hours.   Radiology    No results found.  Cardiac Studies   PV angiogram: 11/23/18  Procedures Performed:               1.  Ultrasound-guided right common femoral access/contralateral access               2.  Placement of an NAV6 distal protection device below the knee left popliteal artery               3.  Diamondback orbital rotational atherectomy distal left SFA and popliteal artery               4.  DCB distal left SFA/popliteal artery  Final Impression: Successful diamondback orbital rotational atherectomy, DCB using distal protection of a highly calcified  subtotally occluded distal left SFA and popliteal artery in the setting of critical limb ischemia.  The dominant vessel below the knee is a peroneal.  There does seem to be a subtotally occluded tibioperoneal trunk which could be percutaneously revascularizable.  Because had use 160 cc did die and his creatinine clearance was in the 35 mL/h range I decided to terminate the procedure.  He will need to be gently hydrated overnight and treated with antiplatelet therapy.  He may ultimately really need a staged peroneal intervention.    [name], [facility]  Patient Profile     85 y.o. male a hx of HTN, HLD, PAF, OSA, CKD III, DM and PVD with stent to Lt SFA 05/2010 and after infection of Rt Gt toe with multiple rounds of ABX Dopplers with ABI of 0.42 on the Rt and follow up PV angio but eventual Rt BKA 08/2018 presented 11/18/2018 with worsening ischemic ulcer to Lt great toe with cellulitis of the left forefoot.  He was hospitalized at [facility] the week prior to admission with volume overload and was diuresed over 20 pounds.   Assessment & Plan    1. PAF: maintaining SR. Eliquis held along with heparin at this time. If not plans for surgery would resume IV heparin.   2. Critical limb ischemia of the left great toe: Was seen by Dr. Gwenlyn Found and underwent PV angiogram with successful diamondback rotational atherectomy to occluded lef SFA and popliteal artery. Does have a subtotal occlusion of TB trunk felt to be amenable to revascularize but unable to complete yesterday given amount of dye used for case. May need staged intervention. Seen by ortho on 11/6, unsure of timing for possible surgery at this time.  -- now on DAPT  with ASA/plavix -- IV antibiotics per primary  3. CKD III: Cr improved to 1.5>>1.43 today with gentle IVFs. No signs of volume overload.  4. Moderate AS: prominent murmur on exam. Mean gradient 25 mmHg via echo from August.   5. HTN: blood pressures stable  today.   For questions or updates, please contact [place] Please consult [email or web address] for contact info under     Signed, Reino Bellis, NP  11/24/2018, 9:02 AM    Patient seen, examined. Available data reviewed. Agree with findings, assessment, and plan as outlined by Reino Bellis, NP.  On exam, the patient is alert, oriented, in no distress.  He is an elderly male.  JVP is normal, heart is regular rate and rhythm with a 2/6 systolic murmur at the right upper sternal border, lungs are clear, groin site is clear, left leg has no edema.  The skin is no longer mottled in the left ankle and foot are warm to touch.  The foot is wrapped in a dressing.  The patient's foot pain has resolved.  He will go for tibial intervention tomorrow to try to reestablish inline flow to his foot.  I have discussed this at length with the patient and his son who is present at the bedside.  Dr. Fletcher Anon will perform the procedure.  He will be continued on dual antiplatelet therapy with aspirin and clopidogrel.  Sherren Mocha, M.D. 11/24/2018 11:05 AM

## 2018-11-24 NOTE — Progress Notes (Signed)
Pharmacy Antibiotic Note  [name] is a 85 y.o. male admitted on 11/18/2018 with diabetic foot infection cellulitis.  toe possible gangrene (has R BKA). Left foot x-ray without evidence of osteomyelitis. Afebrile, WBC 8.4, Scr rising 1.43. Pharmacy consulted for vancomycin and Zosyn dosing.  Plan Vancomycin 1500 mg IV q48h (estAUC 426, Scr used 1.43, goal AUC 400-550) 1330 Vanc random today  11/12: 0800 VT Zosyn 3.375g IV q8h (4 hour infusion).  Monitor clinical improvement and renal function  Height: 5\' 10"  (177.8 cm) Weight: 225 lb 8.5 oz (102.3 kg) IBW/kg (Calculated) : 73  Temp (24hrs), Avg:97.9 F (36.6 C), Min:97.5 F (36.4 C), Max:98.2 F (36.8 C)  Recent Labs  Lab 11/18/18 2209 11/19/18 0023  11/20/18 0738 11/21/18 0248 11/22/18 0451 11/23/18 0342 11/24/18 0328  WBC  --   --   --  8.2 10.5 8.9 9.5 8.4  CREATININE  --   --    < > 1.53* 1.71* 1.60* 1.57* 1.43*  LATICACIDVEN 1.1 1.1  --   --   --   --   --   --    < > = values in this interval not displayed.    Estimated Creatinine Clearance: 42 mL/min (A) (by C-G formula based on SCr of 1.43 mg/dL (H)).    Allergies  Allergen Reactions  . Iodinated Diagnostic Agents     Other reaction(s): Fever  . Dye Fdc Red [Red Dye] Hives  . Iodine-131 Other (See Comments)    Breakout, fever  . Iohexol      Code: RASH, Desc: PT STATES HOT ALL OVER HAD TO HAVE ICE PACKS ALL OVER HIM AND DIFF BREATHING, PT REFUSED IV DYE     Antimicrobials this admission: Vancomycin 11/18/2018 >> Zosyn 11/18/2018 >>    Microbiology results: 11/5 MRSA PCR: negative 11/5 Staph Aureus: negative 11/4 BCx2: negative 11/4 Covid neg   Thank you for allowing pharmacy to be a part of this patient's care.  Lorel Monaco, PharmD PGY1 Ambulatory Care Resident [employer] # [phone]

## 2018-11-25 ENCOUNTER — Inpatient Hospital Stay (HOSPITAL_COMMUNITY)
Admission: EM | Disposition: A | Discharge status: Rehabilitation Facility | Payer: Self-pay | Source: Home / Self Care | Attending: Internal Medicine

## 2018-11-25 DIAGNOSIS — I998 Other disorder of circulatory system: Secondary | ICD-10-CM

## 2018-11-25 DIAGNOSIS — I70212 Atherosclerosis of native arteries of extremities with intermittent claudication, left leg: Secondary | ICD-10-CM

## 2018-11-25 HISTORY — PX: PERIPHERAL VASCULAR INTERVENTION: CATH118257

## 2018-11-25 HISTORY — PX: LOWER EXTREMITY ANGIOGRAPHY: CATH118251

## 2018-11-25 HISTORY — PX: PERIPHERAL VASCULAR BALLOON ANGIOPLASTY: CATH118281

## 2018-11-25 LAB — BASIC METABOLIC PANEL
Anion gap: 9 (ref 5–15)
BUN: 36 mg/dL — ABNORMAL HIGH (ref 8–23)
CO2: 23 mmol/L (ref 22–32)
Calcium: 8.6 mg/dL — ABNORMAL LOW (ref 8.9–10.3)
Chloride: 103 mmol/L (ref 98–111)
Creatinine, Ser: 1.69 mg/dL — ABNORMAL HIGH (ref 0.61–1.24)
GFR calc Af Amer: 41 mL/min — ABNORMAL LOW (ref >60)
GFR calc non Af Amer: 35 mL/min — ABNORMAL LOW (ref >60)
Glucose, Bld: 102 mg/dL — ABNORMAL HIGH (ref 70–99)
Potassium: 4 mmol/L (ref 3.5–5.1)
Sodium: 135 mmol/L (ref 135–145)

## 2018-11-25 LAB — GLUCOSE, CAPILLARY
Glucose-Capillary: 96 mg/dL (ref 70–99)
Glucose-Capillary: 110 mg/dL — ABNORMAL HIGH (ref 70–99)
Glucose-Capillary: 250 mg/dL — ABNORMAL HIGH (ref 70–99)
Glucose-Capillary: 291 mg/dL — ABNORMAL HIGH (ref 70–99)

## 2018-11-25 LAB — CBC
HCT: 29.1 % — ABNORMAL LOW (ref 39.0–52.0)
Hemoglobin: 9.6 g/dL — ABNORMAL LOW (ref 13.0–17.0)
MCH: 30.6 pg (ref 26.0–34.0)
MCHC: 33 g/dL (ref 30.0–36.0)
MCV: 92.7 fL (ref 80.0–100.0)
Platelets: 252 10*3/uL (ref 150–400)
RBC: 3.14 MIL/uL — ABNORMAL LOW (ref 4.22–5.81)
RDW: 14.6 % (ref 11.5–15.5)
WBC: 11.6 10*3/uL — ABNORMAL HIGH (ref 4.0–10.5)
nRBC: 0 % (ref 0.0–0.2)

## 2018-11-25 LAB — POCT ACTIVATED CLOTTING TIME
Activated Clotting Time: 268 seconds
Activated Clotting Time: 268 seconds
Activated Clotting Time: 246 seconds

## 2018-11-25 LAB — VANCOMYCIN, TROUGH: Vancomycin Tr: 17 ug/mL (ref 15–20)

## 2018-11-25 SURGERY — LOWER EXTREMITY ANGIOGRAPHY
Anesthesia: LOCAL

## 2018-11-25 MED ORDER — HEPARIN (PORCINE) 25000 UT/250ML-% IV SOLN
950.0000 [IU]/h | INTRAVENOUS | Status: DC
  Filled 2018-11-25: qty 250

## 2018-11-25 MED ORDER — HEPARIN SODIUM (PORCINE) 1000 UNIT/ML IJ SOLN
INTRAMUSCULAR | Status: AC
  Filled 2018-11-25: qty 1

## 2018-11-25 MED ORDER — MIDAZOLAM HCL 2 MG/2ML IJ SOLN
INTRAMUSCULAR | Status: DC | PRN
  Administered 2018-11-25 (×4): 1 mg via INTRAVENOUS

## 2018-11-25 MED ORDER — NITROGLYCERIN IN D5W 200-5 MCG/ML-% IV SOLN
INTRAVENOUS | Status: AC
  Filled 2018-11-25: qty 250

## 2018-11-25 MED ORDER — VERAPAMIL HCL 2.5 MG/ML IV SOLN
INTRAVENOUS | Status: DC | PRN
  Administered 2018-11-25: 3 mg via INTRA_ARTERIAL

## 2018-11-25 MED ORDER — METHYLPREDNISOLONE SODIUM SUCC 125 MG IJ SOLR
125.0000 mg | Freq: Once | INTRAMUSCULAR | Status: AC
  Administered 2018-11-25: 125 mg via INTRAVENOUS
  Filled 2018-11-25: qty 2

## 2018-11-25 MED ORDER — FENTANYL CITRATE (PF) 100 MCG/2ML IJ SOLN
INTRAMUSCULAR | Status: AC
  Filled 2018-11-25: qty 2

## 2018-11-25 MED ORDER — SODIUM CHLORIDE 0.9% FLUSH: 3.0000 mL | Freq: Two times a day (BID) | INTRAVENOUS | Status: DC

## 2018-11-25 MED ORDER — HEPARIN (PORCINE) IN NACL 1000-0.9 UT/500ML-% IV SOLN
INTRAVENOUS | Status: AC
  Filled 2018-11-25: qty 500

## 2018-11-25 MED ORDER — HEPARIN SODIUM (PORCINE) 1000 UNIT/ML IJ SOLN
INTRAMUSCULAR | Status: DC | PRN
  Administered 2018-11-25: 8000 [IU] via INTRAVENOUS
  Administered 2018-11-25: 3000 [IU] via INTRAVENOUS
  Administered 2018-11-25: 5000 [IU] via INTRAVENOUS

## 2018-11-25 MED ORDER — VERAPAMIL HCL 2.5 MG/ML IV SOLN
INTRAVENOUS | Status: AC
  Filled 2018-11-25: qty 2

## 2018-11-25 MED ORDER — LIDOCAINE HCL (PF) 1 % IJ SOLN
INTRAMUSCULAR | Status: AC
  Filled 2018-11-25: qty 30

## 2018-11-25 MED ORDER — SODIUM CHLORIDE 0.9% FLUSH: 3.0000 mL | INTRAVENOUS | Status: DC | PRN

## 2018-11-25 MED ORDER — IODIXANOL 320 MG/ML IV SOLN
INTRAVENOUS | Status: DC | PRN
  Administered 2018-11-25: 140 mL via INTRA_ARTERIAL

## 2018-11-25 MED ORDER — NITROGLYCERIN 1 MG/10 ML FOR IR/CATH LAB
INTRA_ARTERIAL | Status: DC | PRN
  Administered 2018-11-25: 200 ug via INTRA_ARTERIAL
  Administered 2018-11-25 (×2): 400 ug via INTRA_ARTERIAL
  Administered 2018-11-25: 300 ug via INTRA_ARTERIAL

## 2018-11-25 MED ORDER — SODIUM CHLORIDE 0.9 % IV SOLN: 250.0000 mL | INTRAVENOUS | Status: DC | PRN

## 2018-11-25 MED ORDER — FENTANYL CITRATE (PF) 100 MCG/2ML IJ SOLN
INTRAMUSCULAR | Status: DC | PRN
  Administered 2018-11-25: 25 ug via INTRAVENOUS
  Administered 2018-11-25: 50 ug via INTRAVENOUS
  Administered 2018-11-25 (×5): 25 ug via INTRAVENOUS

## 2018-11-25 MED ORDER — LIDOCAINE HCL (PF) 1 % IJ SOLN
INTRAMUSCULAR | Status: DC | PRN
  Administered 2018-11-25: 20 mL

## 2018-11-25 MED ORDER — SODIUM CHLORIDE 0.9 % WEIGHT BASED INFUSION: 1.0000 mL/kg/h | INTRAVENOUS | Status: AC

## 2018-11-25 MED ORDER — DIPHENHYDRAMINE HCL 50 MG/ML IJ SOLN
25.0000 mg | Freq: Once | INTRAMUSCULAR | Status: AC
  Administered 2018-11-25: 25 mg via INTRAVENOUS
  Filled 2018-11-25: qty 1

## 2018-11-25 MED ORDER — MIDAZOLAM HCL 2 MG/2ML IJ SOLN
INTRAMUSCULAR | Status: AC
  Filled 2018-11-25: qty 2

## 2018-11-25 MED ORDER — HEPARIN (PORCINE) IN NACL 1000-0.9 UT/500ML-% IV SOLN
INTRAVENOUS | Status: AC
  Filled 2018-11-25: qty 1000

## 2018-11-25 MED ORDER — HEPARIN (PORCINE) IN NACL 1000-0.9 UT/500ML-% IV SOLN
INTRAVENOUS | Status: DC | PRN
  Administered 2018-11-25 (×2): 500 mL

## 2018-11-25 SURGICAL SUPPLY — 44 items
BALLN  ~~LOC~~ SAPPHIRE 5.0X18 (BALLOONS) ×1
BALLN CHOCOLATE 5.0X40X120 (BALLOONS) ×3
BALLN COYOTE OTW 3X80X150 (BALLOONS) ×3
BALLN LUTONIX DCB 4X80X130 (BALLOONS) ×3
BALLN LUTONIX DCB 6X40X130 (BALLOONS) ×3
BALLN ~~LOC~~ SAPPHIRE 5.0X18 (BALLOONS) ×2
BALLOON CHOCOLATE 5.0X40X120 (BALLOONS) ×2 IMPLANT
BALLOON COYOTE OTW 3X80X150 (BALLOONS) ×2 IMPLANT
BALLOON LUTONIX DCB 4X80X130 (BALLOONS) ×2 IMPLANT
BALLOON LUTONIX DCB 6X40X130 (BALLOONS) ×2 IMPLANT
BALLOON ~~LOC~~ SAPPHIRE 5.0X18 (BALLOONS) ×2 IMPLANT
CATH ANGIO 5F PIGTAIL 65CM (CATHETERS) IMPLANT
CATH CROSS OVER TEMPO 5F (CATHETERS) ×3 IMPLANT
CATH CXI 2.3F 65 ANG 2 (CATHETERS) ×3 IMPLANT
CATH CXI 4F 150 ST (CATHETERS) ×3 IMPLANT
CATH INFINITI VERT 5FR 125CM (CATHETERS) ×3 IMPLANT
CATH TELEPORT (CATHETERS) ×3 IMPLANT
CATH TEMPO AQUA 5F 100CM (CATHETERS) ×3 IMPLANT
CLOSURE MYNX CONTROL 6F/7F (Vascular Products) ×3 IMPLANT
DEVICE ONE SNARE 10MM (MISCELLANEOUS) ×3 IMPLANT
GUIDEWIRE ASTATO XS 20G 300CM (WIRE) ×6 IMPLANT
GUIDEWIRE ZILIENT 12G 014X300 (WIRE) ×3 IMPLANT
GUIDEWIRE ZILIENT 30G 014 (WIRE) ×3 IMPLANT
GUIDEWIRE ZILIENT 6G 014 (WIRE) ×3 IMPLANT
KIT ENCORE 26 ADVANTAGE (KITS) ×3 IMPLANT
KIT MICROPUNCTURE NIT STIFF (SHEATH) ×9 IMPLANT
KIT PV (KITS) ×3 IMPLANT
NEEDLE PERC 21GX4CM (NEEDLE) ×3 IMPLANT
PATCH THROMBIX TOPICAL PLAIN (HEMOSTASIS) ×6 IMPLANT
SHEATH MICROPUNCTURE PEDAL 4FR (SHEATH) ×3 IMPLANT
SHEATH PINNACLE 5F 10CM (SHEATH) ×3 IMPLANT
SHEATH PINNACLE 6F 10CM (SHEATH) ×3 IMPLANT
SHEATH PINNACLE ST 6F 45CM (SHEATH) ×3 IMPLANT
SHEATH PROBE COVER 6X72 (BAG) ×3 IMPLANT
SHIELD RADPAD SCOOP 12X17 (MISCELLANEOUS) ×3 IMPLANT
STENT RESOLUTE ONYX 4.0X38 (Permanent Stent) ×3 IMPLANT
STENT SYNERGY DES 4X38 (Permanent Stent) ×3 IMPLANT
STOPCOCK MORSE 400PSI 3WAY (MISCELLANEOUS) ×3 IMPLANT
TRANSDUCER W/STOPCOCK (MISCELLANEOUS) ×3 IMPLANT
TRAY PV CATH (CUSTOM PROCEDURE TRAY) ×3 IMPLANT
TUBING CIL FLEX 10 FLL-RA (TUBING) ×3 IMPLANT
WIRE BENTSON .035X145CM (WIRE) ×3 IMPLANT
WIRE SPARTACORE .014X300CM (WIRE) ×6 IMPLANT
WIRE TORQFLEX AUST .018X40CM (WIRE) ×3 IMPLANT

## 2018-11-25 NOTE — Progress Notes (Signed)
Called to check oozing pedal site on left leg. Dressing changed, and a new thrombix patch applied per Dr. Fletcher Anon. Gauze and tegaderm applied over thrombix patch.

## 2018-11-25 NOTE — Progress Notes (Signed)
Notified Aaron Edelman in the cath lab that came to look at minx closer that the area was okay still however, the site on the left lower leg had saturated the gauze dressing with blood. That gauze at that site was changed leaving the patch in place and replacing the 4x4. ~24mins later the 4x4 was half saturated again. Aaron Edelman to bedside to replace total dressing. I will continue to monitor the patient closely.   Saddie Benders RN BSN

## 2018-11-25 NOTE — Progress Notes (Signed)
PROGRESS NOTE    [name]  OZH:[id number] DOB: [birth year] DOA: 11/18/2018 PCP: Redmond School, MD    Brief Narrative:  85 year old male with HTN, HLD, T2DM CKD stage III, PE AF, diastolic CHF, PAD status post right BKA admitted from home with black first toe on left foot.  In the ER x-ray showed evidence of osteomyelitis and was admitted 11/4 for further management of of dry gangrene cellulitis of first left toe. Patient has been on Vanco/Zosyn dosing per pharmacy.  Seen by cardiology, orthopedic. Recent hospitalization past week with volume overload and diuresed with over 20 pound.    Consultants:   Cardiology and orthopedic  Procedures: Lower extremity angiography  Antimicrobials:   Vancomycin and ceftriaxone and metronidazole   Subjective: Patient seen and examined.  Patient has no complaints.  Status post angiogram.  Denies any pain, chest pain, or shortness of breath.  Denies any leg pain  Objective: Vitals:   11/25/18 1308 11/25/18 1313 11/25/18 1313 11/25/18 1400  BP: (!) 168/80  (!) 164/83   Pulse: (!) 57 (!) 59 (!) 58   Resp: (!) 9 (!) 9 (!) 8   Temp:      TempSrc:      SpO2: 99% 99% 100%   Weight:    102.3 kg  Height:    5\' 10"  (1.778 m)    Intake/Output Summary (Last 24 hours) at 11/25/2018 1750 Last data filed at 11/25/2018 1700 Gross per 24 hour  Intake 906.9 ml  Output 1600 ml  Net -693.1 ml   Filed Weights   11/24/18 0529 11/25/18 0434 11/25/18 1400  Weight: 102.3 kg 102.3 kg 102.3 kg    Examination:  General exam: Appears calm and comfortable lying in bed son at bedside Respiratory system: Clear to auscultation. Respiratory effort normal. Cardiovascular system: S1 & S2 heard, RRR.  Harsh 3 out of 6 systolic ejection murmur no gallops Gastrointestinal system: Abdomen is nondistended, soft and nontenderNormal bowel sounds heard. Central nervous system: Alert and oriented. No focal neurological deficits. Extremities: Right growing pulses  intact dressing intact no bleeding.  Right BKA, left toes black. Mild LLE edema. Anterior shin dressing in place Skin: warm, dry Psychiatry:  Mood & affect appropriate.     Data Reviewed: I have personally reviewed following labs and imaging studies  CBC: Recent Labs  Lab 11/18/18 2132  11/21/18 0248 11/22/18 0451 11/23/18 0342 11/24/18 0328 11/25/18 0301  WBC 10.8*   < > 10.5 8.9 9.5 8.4 11.6*  NEUTROABS 5.6  --   --   --   --   --   --   HGB 11.5*   < > 10.3* 10.4* 10.5* 10.3* 9.6*  HCT 34.9*   < > 29.9* 31.7* 32.0* 29.9* 29.1*  MCV 92.6   < > 89.5 91.9 91.7 89.8 92.7  PLT 310   < > 276 289 279 263 252   < > = values in this interval not displayed.   Basic Metabolic Panel: Recent Labs  Lab 11/20/18 0738 11/21/18 0248 11/22/18 0451 11/23/18 0342 11/24/18 0328 11/25/18 0301  NA 133* 131* 134* 134* 132* 135  K 4.3 3.7 3.8 4.0 3.5 4.0  CL 100 94* 96* 99 100 103  CO2 21* 25 26 24 22 23   GLUCOSE 98 145* 114* 113* 251* 102*  BUN 29* 28* 27* 24* 31* 36*  CREATININE 1.53* 1.71* 1.60* 1.57* 1.43* 1.69*  CALCIUM 8.8* 8.7* 8.8* 8.9 8.8* 8.6*  MG 2.1  --   --   --   --   --  GFR: Estimated Creatinine Clearance: 35.5 mL/min (A) (by C-G formula based on SCr of 1.69 mg/dL (H)). Liver Function Tests: Recent Labs  Lab 11/18/18 2123 11/19/18 0417  AST 33 28  ALT 25 22  ALKPHOS 47 41  BILITOT 0.7 0.6  PROT 7.1 6.5  ALBUMIN 3.3* 3.0*   No results for input(s): LIPASE, AMYLASE in the last 168 hours. No results for input(s): AMMONIA in the last 168 hours. Coagulation Profile: No results for input(s): INR, PROTIME in the last 168 hours. Cardiac Enzymes: No results for input(s): CKTOTAL, CKMB, CKMBINDEX, TROPONINI in the last 168 hours. BNP (last 3 results) No results for input(s): PROBNP in the last 8760 hours. HbA1C: No results for input(s): HGBA1C in the last 72 hours. CBG: Recent Labs  Lab 11/24/18 1656 11/24/18 2022 11/24/18 2356 11/25/18 0436 11/25/18 0735   GLUCAP 183* 162* 177* 96 110*   Lipid Profile: No results for input(s): CHOL, HDL, LDLCALC, TRIG, CHOLHDL, LDLDIRECT in the last 72 hours. Thyroid Function Tests: No results for input(s): TSH, T4TOTAL, FREET4, T3FREE, THYROIDAB in the last 72 hours. Anemia Panel: No results for input(s): VITAMINB12, FOLATE, FERRITIN, TIBC, IRON, RETICCTPCT in the last 72 hours. Sepsis Labs: Recent Labs  Lab 11/18/18 2209 11/19/18 0023  LATICACIDVEN 1.1 1.1    Recent Results (from the past 240 hour(s))  Blood culture (routine x 2)     Status: None   Collection Time: 11/18/18  9:07 PM   Specimen: BLOOD RIGHT ARM  Result Value Ref Range Status   Specimen Description   Final    BLOOD RIGHT ARM Performed at [facility], [address], [place], [place] [address]    Special Requests   Final    BOTTLES DRAWN AEROBIC ONLY Blood Culture adequate volume Performed at [facility] [address], [place], [place] [address]    Culture   Final    NO GROWTH 5 DAYS Performed at [facility], [place] [address] [place], [place] [address]    Report Status 11/24/2018 FINAL  Final  SARS CORONAVIRUS 2 (TAT 6-24 HRS) Nasopharyngeal Nasopharyngeal Swab     Status: None   Collection Time: 11/18/18 10:04 PM   Specimen: Nasopharyngeal Swab  Result Value Ref Range Status   SARS Coronavirus 2 NEGATIVE NEGATIVE Final    Comment: (NOTE) SARS-CoV-2 target nucleic acids are NOT DETECTED. The SARS-CoV-2 RNA is generally detectable in upper and lower respiratory specimens during the acute phase of infection. Negative results do not preclude SARS-CoV-2 infection, do not rule out co-infections with other pathogens, and should not be used as the sole basis for treatment or other patient management decisions. Negative results must be combined with clinical observations, patient history, and epidemiological information. The expected result is Negative. Fact Sheet for Patients:  SugarRoll.be Fact Sheet for Healthcare Providers: https://www.woods-mathews.com/ This test is not yet approved or cleared by the [place] FDA and  has been authorized for detection and/or diagnosis of SARS-CoV-2 by FDA under an Emergency Use Authorization (EUA). This EUA will remain  in effect (meaning this test can be used) for the duration of the COVID-19 declaration under Section 56 4(b)(1) of the Act, 21 U.S.C. section 360bbb-3(b)(1), unless the authorization is terminated or revoked sooner. Performed at [facility], [place] [address] [place], [place] [address]   Blood culture (routine x 2)     Status: None   Collection Time: 11/18/18 10:09 PM   Specimen: BLOOD  Result Value Ref Range  Status   Specimen Description   Final    BLOOD RIGHT ANTECUBITAL Performed at [facility] [address], [place], [place] [address]    Special Requests   Final    BOTTLES DRAWN AEROBIC AND ANAEROBIC Blood Culture adequate volume Performed at [facility] [address], [place], [place] [address]    Culture   Final    NO GROWTH 5 DAYS Performed at [facility], [place] [address], [place], [place] [address]    Report Status 11/24/2018 FINAL  Final  Surgical pcr screen     Status: None   Collection Time: 11/19/18  5:30 PM   Specimen: Nasal Mucosa; Nasal Swab  Result Value Ref Range Status   MRSA, PCR NEGATIVE NEGATIVE Final   Staphylococcus aureus NEGATIVE NEGATIVE Final    Comment: (NOTE) The Xpert SA Assay (FDA approved for NASAL specimens in patients 60 years of age and older), is one component of a comprehensive surveillance program. It is not intended to diagnose infection nor to guide or monitor treatment. Performed at [facility], [place] [address] [place],  [address]          Radiology Studies: No results found.      Scheduled Meds: . aspirin EC  81 mg Oral  Daily  . atorvastatin  40 mg Oral Daily  . clopidogrel  75 mg Oral Q breakfast  . collagenase   Topical Daily  . dorzolamide-timolol  1 drop Both Eyes BID  . famotidine  20 mg Oral Daily  . finasteride  5 mg Oral QPM  . insulin aspart  0-9 Units Subcutaneous Q4H  . levothyroxine  50 mcg Oral Q0600  . potassium chloride  10 mEq Oral Daily  . sodium chloride flush  10-40 mL Intracatheter Q12H  . sodium chloride flush  3 mL Intravenous Q12H  . sodium chloride flush  3 mL Intravenous Q12H  . sodium chloride flush  3 mL Intravenous Q12H  . tamsulosin  0.4 mg Oral BID   Continuous Infusions: . sodium chloride    . sodium chloride    . sodium chloride 1 mL/kg/hr (11/25/18 1652)  . cefTRIAXone (ROCEPHIN)  IV 2 g (11/25/18 1604)  . metronidazole 500 mg (11/25/18 1456)  . vancomycin Stopped (11/24/18 1056)    Assessment & Plan:   Principal Problem:   Toe infection Active Problems:   Peripheral vascular disease (HCC)   Essential hypertension, benign   Hyperlipidemia   Aortic valve stenosis   Hypothyroidism   PAF (paroxysmal atrial fibrillation) (HCC)   Type 2 diabetes mellitus with foot ulcer, without long-term current use of insulin (HCC)   Critical limb ischemia with history of revascularization of same extremity   First left toe with dry gangrene/ischemic ulcer and cellulitis in the setting of PVD-with critical limb ischemia left great toe:  Continue with on vancomycin, ceftriaxone. Dosing per pharmacy  Blood culture negative x2 -  s/p LE angiogram with revascularization attempt  by Dr. Cheri Guppy a subtotal occlusion of TP trunk felt to be amenable to revascularize but unable to completegiven amount of dye used 11/9-  Today,S/p Successful complex revascularization of the left TP trunk into the peroneal artery using 2 overlapping drug-eluting stents as well as drug coated balloon angioplasty of the distal left popliteal artery.   -continue hydration per cards patient is at high  risk for contrast-induced nephropathy Continue dual antiplatelet therapy  Essential hypertension, benign:  BP stable.  Hyperlipidemia: Continue Lipitor  Hypothyroidism:  Continue home levothyroxine 50 MCG.  CKD stage IIIb creatinine baseline 1-.5 -1.7. stable and improved But will monitor closely as he did receive contrast and is at high risk for contrast-induced nephropathy.     Moderate aortic stenosis- MG 25 mm hg by echo 08/2018. Cards following  PAF: In NSR:On aspirin and DOAC which held and was on heparin gtt.   Type 2 diabetes mellitus with foot ulcer, without long-term current use of insulin: BG well controlled Holding home glipizide.  Continue FS check and RISS  BPH- continue Flomax and finasteride.     DVT PPX: SCD Code Status:DNR Family Communication: Spoke to son who is present at bedside today all questions and concerns were answered Disposition Plan: Remains inpatient pending clinical improvement. Possible d/c in 2 days.        LOS: 6 days   Time spent:45 min with >50% on [place], MD [facility] Pager [phone]  If 7PM-7AM, please contact night-coverage [email or web address] Password [email or web address] 11/25/2018, 5:50 PM

## 2018-11-25 NOTE — Progress Notes (Addendum)
ANTICOAGULATION CONSULT NOTE - Initial Consult  Pharmacy Consult for Heparin Indication: atrial fibrillation  Allergies  Allergen Reactions  . Iodinated Diagnostic Agents     Other reaction(s): Fever  . Dye Fdc Red [Red Dye] Hives  . Iodine-131 Other (See Comments)    Breakout, fever  . Iohexol      Code: RASH, Desc: PT STATES HOT ALL OVER HAD TO HAVE ICE PACKS ALL OVER HIM AND DIFF BREATHING, PT REFUSED IV DYE     Patient Measurements: Height: 5\' 10"  (177.8 cm) Weight: 225 lb 8.5 oz (102.3 kg) IBW/kg (Calculated) : 73 Heparin Dosing Weight: 94.6  Vital Signs: Temp: 98.1 F (36.7 C) (11/11 0434) Temp Source: Oral (11/11 0434) BP: 164/83 (11/11 1313) Pulse Rate: 58 (11/11 1313)  Labs: Recent Labs    11/23/18 0342 11/24/18 0328 11/25/18 0301  HGB 10.5* 10.3* 9.6*  HCT 32.0* 29.9* 29.1*  PLT 279 263 252  APTT 80*  --   --   HEPARINUNFRC 0.49  --   --   CREATININE 1.57* 1.43* 1.69*    Estimated Creatinine Clearance: 35.5 mL/min (A) (by C-G formula based on SCr of 1.69 mg/dL (H)).   Medical History: Past Medical History:  Diagnosis Date  . Arthritis   . Borderline diabetic   . Diabetes mellitus without complication ([place])   . Glaucoma   . Hyperlipidemia   . Hypertension   . PVD (peripheral vascular disease) ([place])   . Sleep apnea    Cpap ordered but doesnt use    Medications:  Scheduled:  . [MAR Hold] aspirin EC  81 mg Oral Daily  . [MAR Hold] atorvastatin  40 mg Oral Daily  . [MAR Hold] clopidogrel  75 mg Oral Q breakfast  . [MAR Hold] collagenase   Topical Daily  . [MAR Hold] dorzolamide-timolol  1 drop Both Eyes BID  . [MAR Hold] famotidine  20 mg Oral Daily  . [MAR Hold] finasteride  5 mg Oral QPM  . [MAR Hold] insulin aspart  0-9 Units Subcutaneous Q4H  . [MAR Hold] levothyroxine  50 mcg Oral Q0600  . [MAR Hold] potassium chloride  10 mEq Oral Daily  . [MAR Hold] sodium chloride flush  10-40 mL Intracatheter Q12H  . [MAR Hold] sodium chloride  flush  3 mL Intravenous Q12H  . [MAR Hold] sodium chloride flush  3 mL Intravenous Q12H  . sodium chloride flush  3 mL Intravenous Q12H  . [MAR Hold] tamsulosin  0.4 mg Oral BID   Infusions:  . [MAR Hold] sodium chloride    . sodium chloride    . sodium chloride    . [MAR Hold] cefTRIAXone (ROCEPHIN)  IV Stopped (11/24/18 1556)  . heparin    . [MAR Hold] metronidazole 500 mg (11/25/18 0616)  . [MAR Hold] vancomycin Stopped (11/24/18 1056)    Assessment: 85 y.o. male presenting with diabetic foot cellulitis - toe possible for gangree. PMH includes HTN, HLD, DM2, CKD3, CHF (diastolic), PAD with stent to Lt SFA 05/2010, s/p R BKA due to critical limb ischemia, BPH, glaucoma, and AFIB on Apixaban 2.5 mg BID (last dose 11/5). Hgb 9.6, Plts 252. Pharmacy consulted for heparin dosing.  Last HL was therapeutic at 0.49 on 950 units/hr (11/8). Restarting heparin today because surgery/amputation date is undetermined. Patient is s/p PV cath.   Goal of Therapy:  Heparin level 0.3-0.7 units/ml Monitor platelets by anticoagulation protocol: Yes   Plan:  Start Heparin 950 units/hr at 1900 (6 hours after sheath removal) Daily  HL and CBC Monitor for s/sx of bleeding  Lorel Monaco, PharmD PGY1 Ambulatory Care Resident [employer] # [phone]

## 2018-11-25 NOTE — Progress Notes (Signed)
   11/25/18 1523  First Vascular Site Assessment  #1 - Location of Site Assessment Right femoral  #1 - Vascular Site Assessment Scale Level 2   This RN held pressure for ~10 mins softening hematoma. Will notify Cath Lab of repeat pressure holding for reassessment. I will continue to monitor the site closely.   Saddie Benders RN BSN

## 2018-11-25 NOTE — Progress Notes (Signed)
Patient leaving for revasculerzation procedure to left foot upon arrival to room, Foot dressing changed this morning.  Will plan for definitive ortho procedure once vascular flow optimized in left foot

## 2018-11-25 NOTE — Progress Notes (Signed)
   11/25/18 1430  First Vascular Site Assessment  #1 - Location of Site Assessment Right femoral  #1 - Vascular Site Assessment Scale Level 2   This RN and Oncologist held pressure for ~10 mins softening hematoma. I will continue to monitor closely.   Saddie Benders RN BSN

## 2018-11-25 NOTE — Interval H&P Note (Signed)
History and Physical Interval Note:  11/25/2018 8:45 AM  [name]  has presented today for surgery, with the diagnosis of pvd.  The various methods of treatment have been discussed with the patient and family. After consideration of risks, benefits and other options for treatment, the patient has consented to  Procedure(s): LOWER EXTREMITY ANGIOGRAPHY (N/A) as a surgical intervention.  The patient's history has been reviewed, patient examined, no change in status, stable for surgery.  I have reviewed the patient's chart and labs.  Questions were answered to the patient's satisfaction.     Kathlyn [place]

## 2018-11-26 ENCOUNTER — Encounter (HOSPITAL_COMMUNITY): Payer: Self-pay | Admitting: Cardiovascular Disease

## 2018-11-26 LAB — BASIC METABOLIC PANEL
Anion gap: 9 (ref 5–15)
BUN: 35 mg/dL — ABNORMAL HIGH (ref 8–23)
CO2: 23 mmol/L (ref 22–32)
Calcium: 8.4 mg/dL — ABNORMAL LOW (ref 8.9–10.3)
Chloride: 105 mmol/L (ref 98–111)
Creatinine, Ser: 1.67 mg/dL — ABNORMAL HIGH (ref 0.61–1.24)
GFR calc Af Amer: 41 mL/min — ABNORMAL LOW (ref >60)
GFR calc non Af Amer: 36 mL/min — ABNORMAL LOW (ref >60)
Glucose, Bld: 182 mg/dL — ABNORMAL HIGH (ref 70–99)
Potassium: 3.8 mmol/L (ref 3.5–5.1)
Sodium: 137 mmol/L (ref 135–145)

## 2018-11-26 LAB — GLUCOSE, CAPILLARY
Glucose-Capillary: 139 mg/dL — ABNORMAL HIGH (ref 70–99)
Glucose-Capillary: 148 mg/dL — ABNORMAL HIGH (ref 70–99)
Glucose-Capillary: 153 mg/dL — ABNORMAL HIGH (ref 70–99)
Glucose-Capillary: 171 mg/dL — ABNORMAL HIGH (ref 70–99)
Glucose-Capillary: 178 mg/dL — ABNORMAL HIGH (ref 70–99)
Glucose-Capillary: 181 mg/dL — ABNORMAL HIGH (ref 70–99)
Glucose-Capillary: 278 mg/dL — ABNORMAL HIGH (ref 70–99)

## 2018-11-26 LAB — CBC
HCT: 26.9 % — ABNORMAL LOW (ref 39.0–52.0)
Hemoglobin: 8.6 g/dL — ABNORMAL LOW (ref 13.0–17.0)
MCH: 30.1 pg (ref 26.0–34.0)
MCHC: 32 g/dL (ref 30.0–36.0)
MCV: 94.1 fL (ref 80.0–100.0)
Platelets: 242 10*3/uL (ref 150–400)
RBC: 2.86 MIL/uL — ABNORMAL LOW (ref 4.22–5.81)
RDW: 14.6 % (ref 11.5–15.5)
WBC: 11.3 10*3/uL — ABNORMAL HIGH (ref 4.0–10.5)
nRBC: 0 % (ref 0.0–0.2)

## 2018-11-26 MED FILL — Nitroglycerin IV Soln 200 MCG/ML in D5W: INTRAVENOUS | Qty: 250 | Status: AC

## 2018-11-26 NOTE — TOC Progression Note (Signed)
Transition of Care [facility]) - Progression Note    Patient Details  Name: [name] MRN: [id number] Date of Birth: [birth year]  Transition of Care [facility]) CM/SW Contact  Graves-Bigelow, Ocie Cornfield, RN Phone Number: 11/26/2018, 10:28 AM  Clinical Narrative: Post revascularization- CM will continue to monitor for transition of care needs.       Expected Discharge Plan: [place] Barriers to Discharge: Continued Medical Work up  Expected Discharge Plan and Services Expected Discharge Plan: [place] In-house Referral: NA Discharge Planning Services: CM Consult Post Acute Care Choice: Home Health, Resumption of Svcs/PTA Provider([facility] with [facility]) Living arrangements for the past 2 months: [place]: RN [place] Agency: [place] ([place]) Date [place]: 11/26/18 Time [place]: 96 Representative spoke with at [place]: [place] ([place]) Interventions    Readmission Risk Interventions Readmission Risk Prevention Plan 11/26/2018 11/21/2018 11/12/2018  Transportation Screening Complete Complete -  PCP or Specialist Appt within 3-5 Days - - -  Not Complete comments - - -  HRI or [place] or Home Care Consult comments - - -  Social Work Scientific laboratory technician for [place] Planning/Counseling - - -  Palliative Care Screening - - -  Medication Review Press photographer) Complete Referral to Pharmacy -  PCP or Specialist appointment within 3-5 days of discharge Complete Complete Complete  HRI or Home Care Consult Complete Complete -  SW Recovery Care/Counseling Consult Complete Complete -  Palliative Care Screening Not Applicable Not Applicable -  [place] Not Applicable Not Complete Not Applicable  Some recent data might be hidden

## 2018-11-26 NOTE — TOC Initial Note (Deleted)
Transition of Care [facility]) - Initial/Assessment Note    Patient Details  Name: [name] MRN: [id number] Date of Birth: [birth year]  Transition of Care [facility]) CM/SW Contact:    Bethena Roys, RN Phone Number: 11/26/2018, 10:20 AM  Clinical Narrative:  Pt presented from home with spouse and support of family. PTA active with [facility] [place]- has all DME in the home. CM did speak with Webb Silversmith and she is following the patient while in the hospital. CM will continue to monitor for additional transition of care needs.                  Expected Discharge Plan: Home w Hospice Care Barriers to Discharge: Continued Medical Work up   Patient Goals and CMS Choice Patient states their goals for this hospitalization and ongoing recovery are:: "to return home with family"   Choice offered to / list presented to : NA  Expected Discharge Plan and Services Expected Discharge Plan: [place] In-house Referral: NA Discharge Planning Services: CM Consult Post Acute Care Choice: Hospice Living arrangements for the past 2 months: [place]: RN [facility] Agency: [facility] Date [place]: 11/26/18 Time [place]: 58 Representative spoke with at [place]: [place].  Prior Living Arrangements/Services Living arrangements for the past 2 months: [place] with:: Spouse Patient language and need for interpreter reviewed:: Yes Do you feel safe going back to the place where you live?: Yes      Need for Family Participation in Patient Care: Yes (Comment) Care giver support system in place?: Yes (comment) Current home services: DME, Home RN(DME and RN via [place].) Criminal Activity/Legal Involvement Pertinent to Current Situation/Hospitalization: No - Comment as needed  Activities of Daily Living Home Assistive Devices/Equipment:  Eyeglasses, Dentures (specify type), Hospital bed, [employer], Environmental consultant (specify type), Bedside commode/3-in-1(upper/lower dentures, front wheeled walker) ADL Screening (condition at time of admission) Patient's cognitive ability adequate to safely complete daily activities?: Yes Is the patient deaf or have difficulty hearing?: No Does the patient have difficulty seeing, even when wearing glasses/contacts?: No Does the patient have difficulty concentrating, remembering, or making decisions?: No Patient able to express need for assistance with ADLs?: Yes Does the patient have difficulty dressing or bathing?: Yes Independently performs ADLs?: No Communication: Independent Is this a change from baseline?: Pre-admission baseline Grooming: Needs assistance Is this a change from baseline?: Pre-admission baseline Feeding: Needs assistance Is this a change from baseline?: Pre-admission baseline Bathing: Needs assistance Is this a change from baseline?: Pre-admission baseline Toileting: Dependent Is this a change from baseline?: Pre-admission baseline In/Out Bed: Dependent Is this a change from baseline?: Pre-admission baseline Walks in Home: Dependent Is this a change from baseline?: Pre-admission baseline Does the patient have difficulty walking or climbing stairs?: Yes(secondary to weakness) Weakness of Legs: Both Weakness of Arms/Hands: None  Permission Sought/Granted Permission sought to share information with : Family Supports, Chartered certified accountant granted to share information with : Yes, Verbal Permission Granted     Permission granted to share info w AGENCY: [facility]        Emotional Assessment Appearance:: Appears stated age Attitude/Demeanor/Rapport: Unable to Assess Affect (typically observed): Unable to Assess Orientation: : Oriented to Self, Oriented to Place, Oriented to  Time  Alcohol / Substance Use: Not Applicable Psych  Involvement: No (comment)  Admission diagnosis:  Peripheral vascular disease ([place]) [I73.9] Cellulitis of left lower extremity [L03.116] Leukocytosis, unspecified type [D72.829] Diabetic ulcer of toe of left foot associated with type 2 diabetes mellitus, unspecified ulcer stage ([place]) [Q76.195, L97.529] Toe infection [L08.9] Patient Active Problem List   Diagnosis Date Noted  . Critical limb ischemia with history of revascularization of same extremity   . Gangrene of toe of left foot ([place])   . Diabetic ulcer of toe of left foot associated with type 2 diabetes mellitus ([place])   . Cellulitis of left lower extremity   . Toe infection 11/18/2018  . Peripheral edema   . Acute on chronic diastolic CHF (congestive heart failure) ([place]) 11/09/2018  . S/P BKA (below knee amputation) unilateral, right ([place]) 10/05/2018  . Hyponatremia   . Essential hypertension   . Postoperative pain   . Pain of left heel   . Unable to maintain weight-bearing   . Type 2 diabetes mellitus with diabetic peripheral angiopathy with gangrene ([place])   . Anemia due to acute blood loss   . Stage 3 chronic kidney disease   . Acquired absence of right leg below knee ([place]) 09/07/2018  . Gangrene of right foot ([place])   . DNR (do not resuscitate) discussion   . Goals of care, counseling/discussion   . Palliative care by specialist   . Type 2 diabetes mellitus with foot ulcer and gangrene ([place])   . Severe protein-calorie malnutrition ([place])   . Ischemic foot 08/20/2018  . Critical lower limb ischemia 08/03/2018  . Type 2 diabetes mellitus with foot ulcer, without long-term current use of insulin ([place])   . Gastroesophageal reflux disease   . Cellulitis of great toe of right foot 05/29/2018  . Atrial fibrillation, chronic   . Chest pain 07/25/2017  . PAF (paroxysmal atrial fibrillation) ([place]) 07/25/2017  . BPH (benign prostatic hyperplasia) 07/25/2017  . Shortness of breath 05/11/2017  . Hypothyroidism 05/11/2017  . Chronic  diastolic CHF (congestive heart failure) ([place]) 05/11/2017  . Aortic valve stenosis 06/12/2016  . RBBB 06/12/2016  . Peripheral vascular disease ([place]) 08/04/2012  . Essential hypertension, benign 08/04/2012  . Hyperlipidemia 08/04/2012  . Dizziness 08/04/2012   PCP:  Redmond School, MD Pharmacy:   [facility] [place], [place] AT [place] [address] [place] [address] Phone: [phone] Fax: [phone]  [facility] [place], [place] - [address] [place] [address] Phone: [phone] Fax: [phone]     Social Determinants of Health (SDOH) Interventions    Readmission Risk Interventions Readmission Risk Prevention Plan 11/26/2018 11/21/2018 11/12/2018  Transportation Screening Complete Complete -  PCP or Specialist Appt within 3-5 Days - - -  Not Complete comments - - -  HRI or [place] or Home Care Consult comments - - -  Social Work Consult for [place] Planning/Counseling - - -  Palliative Care Screening - - -  Medication Review (RN Care Manager) Complete Referral to Pharmacy -  PCP or Specialist appointment within 3-5 days of discharge Complete Complete Complete  HRI or Home Care Consult Complete Complete -  SW Recovery Care/Counseling Consult Complete Complete -  Palliative Care Screening Not Applicable Not Applicable -  [place] Not Applicable Not Complete Not Applicable  Some recent data might be hidden

## 2018-11-26 NOTE — Progress Notes (Addendum)
Progress Note  Patient Name: [name] Date of Encounter: 11/26/2018  Primary Cardiologist: Pixie Casino, MD   Subjective   Feeling well this morning. Some pain in his foot/ankle.   Inpatient Medications    Scheduled Meds: . aspirin EC  81 mg Oral Daily  . atorvastatin  40 mg Oral Daily  . clopidogrel  75 mg Oral Q breakfast  . collagenase   Topical Daily  . dorzolamide-timolol  1 drop Both Eyes BID  . famotidine  20 mg Oral Daily  . finasteride  5 mg Oral QPM  . insulin aspart  0-9 Units Subcutaneous Q4H  . levothyroxine  50 mcg Oral Q0600  . potassium chloride  10 mEq Oral Daily  . sodium chloride flush  10-40 mL Intracatheter Q12H  . sodium chloride flush  3 mL Intravenous Q12H  . sodium chloride flush  3 mL Intravenous Q12H  . sodium chloride flush  3 mL Intravenous Q12H  . tamsulosin  0.4 mg Oral BID   Continuous Infusions: . sodium chloride    . sodium chloride    . cefTRIAXone (ROCEPHIN)  IV 2 g (11/25/18 1604)  . metronidazole 500 mg (11/26/18 0505)  . vancomycin Stopped (11/24/18 1056)   PRN Meds: sodium chloride, sodium chloride, acetaminophen, hydrALAZINE, morphine injection, ondansetron (ZOFRAN) IV, sodium chloride flush, sodium chloride flush, sodium chloride flush   Vital Signs    Vitals:   11/25/18 2009 11/26/18 0010 11/26/18 0425 11/26/18 0800  BP: (!) 145/58 137/60 (!) 143/59 (!) 132/52  Pulse: 61 (!) 57 (!) 55   Resp: 15 14 12    Temp: 98 F (36.7 C)  97.9 F (36.6 C) 98.3 F (36.8 C)  TempSrc: Oral  Oral Oral  SpO2: 98% 97% 97%   Weight:   102.8 kg   Height:        Intake/Output Summary (Last 24 hours) at 11/26/2018 1014 Last data filed at 11/26/2018 0426 Gross per 24 hour  Intake 1677.7 ml  Output 1750 ml  Net -72.3 ml   Last 3 Weights 11/26/2018 11/25/2018 11/25/2018  Weight (lbs) 226 lb 10.1 oz 225 lb 8.5 oz 225 lb 8.5 oz  Weight (kg) 102.8 kg 102.3 kg 102.3 kg      Telemetry    SB - Personally Reviewed  ECG   No new tracing this morning.  Physical Exam  Frail older WM GEN: No acute distress.   Neck: No JVD Cardiac: RRR, 2/6 systolic murmur, rubs, or gallops.  Respiratory: Clear to auscultation bilaterally. GI: Soft, nontender, non-distended  MS: Right BKA, Left foot is pink today. Foot wrapped.  Neuro:  Nonfocal  Psych: Normal affect   Labs    High Sensitivity Troponin:   Recent Labs  Lab 11/09/18 1209  TROPONINIHS 14      Chemistry Recent Labs  Lab 11/24/18 0328 11/25/18 0301 11/26/18 0501  NA 132* 135 137  K 3.5 4.0 3.8  CL 100 103 105  CO2 22 23 23   GLUCOSE 251* 102* 182*  BUN 31* 36* 35*  CREATININE 1.43* 1.69* 1.67*  CALCIUM 8.8* 8.6* 8.4*  GFRNONAA 43* 35* 36*  GFRAA 50* 41* 41*  ANIONGAP 10 9 9      Hematology Recent Labs  Lab 11/24/18 0328 11/25/18 0301 11/26/18 0501  WBC 8.4 11.6* 11.3*  RBC 3.33* 3.14* 2.86*  HGB 10.3* 9.6* 8.6*  HCT 29.9* 29.1* 26.9*  MCV 89.8 92.7 94.1  MCH 30.9 30.6 30.1  MCHC 34.4 33.0 32.0  RDW  14.0 14.6 14.6  PLT 263 252 242    BNPNo results for input(s): BNP, PROBNP in the last 168 hours.   DDimer No results for input(s): DDIMER in the last 168 hours.   Radiology    No results found.  Cardiac Studies   PV angiogram: 11/23/18  Procedures Performed: 1. Ultrasound-guided right common femoral access/contralateral access 2. Placement of an NAV6 distal protection device below the knee left popliteal artery 3. Diamondback orbital rotational atherectomy distal left SFA and popliteal artery 4. DCB distal left SFA/popliteal artery  Final Impression:Successful diamondback orbital rotational atherectomy, DCB using distal protection of a highly calcified subtotally occluded distal left SFA and popliteal artery in the setting of critical limb ischemia. The dominant vessel below the knee is a peroneal. There does seem to be a subtotally occluded tibioperoneal trunk  which could be percutaneously revascularizable. Because had use 160 cc did die and his creatinine clearance was in the 35 mL/h range I decided to terminate the procedure. He will need to be gently hydrated overnight and treated with antiplatelet therapy. He may ultimately really need a staged peroneal intervention.  [name], [facility]  PV angiogram: 11/25/18  Successful complex revascularization of the left TP trunk into the peroneal artery using 2 overlapping drug-eluting stents as well as drug coated balloon angioplasty of the distal left popliteal artery.  The occlusion was crossed retrograde via the peroneal artery.  Recommendations: Continue dual antiplatelet therapy. Hydrate overnight as he is high risk for contrast-induced nephropathy. The patient received excessive amount of radiation and will be counseled.  Patient Profile     85 y.o. male a hx of HTN, HLD, PAF, OSA, CKD III, DM and PVD with stent to Lt SFA 05/2010 and after infection of Rt Gt toe with multiple rounds of ABX Dopplers with ABI of 0.42 on the Rt and follow up PV angio but eventual Rt BKA 08/2018 presented 11/18/2018 with worsening ischemic ulcer to Lt great toe with cellulitis of the left forefoot.  He was hospitalized [place] to admissionwith volume overload and was diuresed over 20 pounds.   Assessment & Plan    1. TIR:[id number] SR. Eliquis held along with heparin at this time. If no plans for surgery would resume Eliquis today.  2. Critical limb ischemia of the left great toe:Was seen by Dr. Gwenlyn Found and underwent PV angiogram with successful diamondback rotational atherectomy to occluded lef SFA and popliteal artery on 11/9. Had staged intervention yesterday with Dr. Fletcher Anon with successful but complex revascularization of the lef TP trunck. Has been on DAPT with ASA/plavix. Given advanced age would not favor triple therapy. Therefore will plan to stop ASA when Eliquis is  resumed.   3. CKD III:Cr improved to 1.5>>1.43>>1.69>>1.67 today. No signs of volume overload.  4. Moderate AS: prominent murmur on exam. Mean gradient 25 mmHg via echo from August.   5. HTN: blood pressures stable today.   6. Anemia: 10.3>>9.6>>8.6. Suspect could be dilutional from IVFs. No reported bleeding issues.    -- follow CBC  For questions or updates, please contact [place] Please consult [email or web address] for contact info under   Signed, Reino Bellis, NP  11/26/2018, 10:14 AM    Patient seen, examined. Available data reviewed. Agree with findings, assessment, and plan as outlined by Reino Bellis, NP.  Elderly male in no distress.  Lungs clear, heart regular rate and rhythm with 2/6 harsh systolic murmur at the right upper sternal border, abdomen is soft and nontender,  the Left leg exam shows that the left foot is warm.  Necrotic toes unchanged.  Peroneal arterial access site is clear with no hematoma.  When the patient is restarted on apixaban, would discontinue aspirin.  He will need to remain on Plavix for his peripheral arterial disease and critical limb ischemia.  Sherren Mocha, M.D. 11/26/2018 2:27 PM

## 2018-11-26 NOTE — Progress Notes (Signed)
PROGRESS NOTE    [name]  ZDG:[id number] DOB: [birth year] DOA: 11/18/2018 PCP: Redmond School, MD    Brief Narrative:  85 year old male with HTN, HLD, T2DM CKD stage III, PE AF, diastolic CHF, PAD status post right BKA admitted from home with black first toe on left foot. In the ER x-ray showed evidence of ?osteomyelitis but reviewing final xr, no osteomyelitis was detected and was admitted 11/4 for further management of of dry gangrene cellulitis of first left toe. Patient was placed on Vanco/Zosyn dosing per pharmacy. Recent hospitalization past week with volume overload and diuresed with over 20 pound.    Consultants:  85 year old male with HTN, HLD, T2DM CKD stage III, PE AF, diastolic CHF, PAD status post right BKA admitted from home with black first toe on left foot. In the ER x-ray showed evidence of osteomyelitis and was admitted 11/4 for further management of of dry gangrene cellulitis of first left toe. Patient has been on Vanco/Zosyn dosing per pharmacy. Seen by cardiology, orthopedic. Recent hospitalization past week with volume overload and diuresed with over 20 pound.  Procedures:  Lower extremity angiography  Antimicrobials:   Vancomycin and ceftriaxone and metronidazole   Subjective: Son at bedside.  Patient seen and examined.  He has no complaints.  Denies shortness of breath, chest pain, abdominal pain.  Reports soreness when moving his left leg and soreness located at the ankle.  Objective: Vitals:   11/26/18 0425 11/26/18 0800 11/26/18 1100 11/26/18 1400  BP: (!) 143/59 (!) 132/52 (!) 151/62 (!) 160/68  Pulse: (!) 55  (!) 54 (!) 57  Resp: 12  14 14   Temp: 97.9 F (36.6 C) 98.3 F (36.8 C) 97.7 F (36.5 C) 97.8 F (36.6 C)  TempSrc: Oral Oral Oral Oral  SpO2: 97%  98% 99%  Weight: 102.8 kg     Height:        Intake/Output Summary (Last 24 hours) at 11/26/2018 1638 Last data filed at 11/26/2018 0426 Gross per 24 hour  Intake 1457.7 ml   Output 850 ml  Net 607.7 ml   Filed Weights   11/25/18 0434 11/25/18 1400 11/26/18 0425  Weight: 102.3 kg 102.3 kg 102.8 kg    Examination: General exam: Appears calm and comfortable lying in bed son at bedside Respiratory system: Clear to auscultation. Respiratory effort normal. Cardiovascular system: S1 & S2 heard, RRR.  Harsh 3 out of 6 systolic ejection murmur no gallops Gastrointestinal system: Abdomen is nondistended, soft and nontenderNormal bowel sounds heard. Central nervous system: Alert and oriented. No focal neurological deficits. Extremities: Right growing pulses intact dressing intact no bleeding.  Right BKA, left toes black. Mild LLE edema. Anterior shin dressing in place Skin: warm, dry Psychiatry:  Mood & affect appropriate.     Data Reviewed: I have personally reviewed following labs and imaging studies  CBC: Recent Labs  Lab 11/22/18 0451 11/23/18 0342 11/24/18 0328 11/25/18 0301 11/26/18 0501  WBC 8.9 9.5 8.4 11.6* 11.3*  HGB 10.4* 10.5* 10.3* 9.6* 8.6*  HCT 31.7* 32.0* 29.9* 29.1* 26.9*  MCV 91.9 91.7 89.8 92.7 94.1  PLT 289 279 263 252 951   Basic Metabolic Panel: Recent Labs  Lab 11/20/18 0738  11/22/18 0451 11/23/18 0342 11/24/18 0328 11/25/18 0301 11/26/18 0501  NA 133*   < > 134* 134* 132* 135 137  K 4.3   < > 3.8 4.0 3.5 4.0 3.8  CL 100   < > 96* 99 100 103 105  CO2 21*   < >  26 24 22 23 23   GLUCOSE 98   < > 114* 113* 251* 102* 182*  BUN 29*   < > 27* 24* 31* 36* 35*  CREATININE 1.53*   < > 1.60* 1.57* 1.43* 1.69* 1.67*  CALCIUM 8.8*   < > 8.8* 8.9 8.8* 8.6* 8.4*  MG 2.1  --   --   --   --   --   --    < > = values in this interval not displayed.   GFR: Estimated Creatinine Clearance: 36 mL/min (A) (by C-G formula based on SCr of 1.67 mg/dL (H)). Liver Function Tests: No results for input(s): AST, ALT, ALKPHOS, BILITOT, PROT, ALBUMIN in the last 168 hours. No results for input(s): LIPASE, AMYLASE in the last 168 hours. No  results for input(s): AMMONIA in the last 168 hours. Coagulation Profile: No results for input(s): INR, PROTIME in the last 168 hours. Cardiac Enzymes: No results for input(s): CKTOTAL, CKMB, CKMBINDEX, TROPONINI in the last 168 hours. BNP (last 3 results) No results for input(s): PROBNP in the last 8760 hours. HbA1C: No results for input(s): HGBA1C in the last 72 hours. CBG: Recent Labs  Lab 11/25/18 2008 11/26/18 0003 11/26/18 0418 11/26/18 0805 11/26/18 1115  GLUCAP 291* 278* 171* 148* 139*   Lipid Profile: No results for input(s): CHOL, HDL, LDLCALC, TRIG, CHOLHDL, LDLDIRECT in the last 72 hours. Thyroid Function Tests: No results for input(s): TSH, T4TOTAL, FREET4, T3FREE, THYROIDAB in the last 72 hours. Anemia Panel: No results for input(s): VITAMINB12, FOLATE, FERRITIN, TIBC, IRON, RETICCTPCT in the last 72 hours. Sepsis Labs: No results for input(s): PROCALCITON, LATICACIDVEN in the last 168 hours.  Recent Results (from the past 240 hour(s))  Blood culture (routine x 2)     Status: None   Collection Time: 11/18/18  9:07 PM   Specimen: BLOOD RIGHT ARM  Result Value Ref Range Status   Specimen Description   Final    BLOOD RIGHT ARM Performed at [facility], [address], [place], [place] [address]    Special Requests   Final    BOTTLES DRAWN AEROBIC ONLY Blood Culture adequate volume Performed at [facility] [address], [place], [place] [address]    Culture   Final    NO GROWTH 5 DAYS Performed at [facility], [place] [address] [place], [place] [address]    Report Status 11/24/2018 FINAL  Final  SARS CORONAVIRUS 2 (TAT 6-24 HRS) Nasopharyngeal Nasopharyngeal Swab     Status: None   Collection Time: 11/18/18 10:04 PM   Specimen: Nasopharyngeal Swab  Result Value Ref Range Status   SARS Coronavirus 2 NEGATIVE NEGATIVE Final    Comment: (NOTE) SARS-CoV-2 target nucleic acids are NOT DETECTED. The SARS-CoV-2 RNA is  generally detectable in upper and lower respiratory specimens during the acute phase of infection. Negative results do not preclude SARS-CoV-2 infection, do not rule out co-infections with other pathogens, and should not be used as the sole basis for treatment or other patient management decisions. Negative results must be combined with clinical observations, patient history, and epidemiological information. The expected result is Negative. Fact Sheet for Patients: SugarRoll.be Fact Sheet for Healthcare Providers: https://www.woods-mathews.com/ This test is not yet approved or cleared by the [place] FDA and  has been authorized for detection and/or diagnosis of SARS-CoV-2 by FDA under an Emergency Use Authorization (EUA). This EUA will remain  in effect (meaning this test can be used) for the duration  of the COVID-19 declaration under Section 56 4(b)(1) of the Act, 21 U.S.C. section 360bbb-3(b)(1), unless the authorization is terminated or revoked sooner. Performed at [facility], [place] [address] [place], [place] [address]   Blood culture (routine x 2)     Status: None   Collection Time: 11/18/18 10:09 PM   Specimen: BLOOD  Result Value Ref Range Status   Specimen Description   Final    BLOOD RIGHT ANTECUBITAL Performed at [facility] [address] [place], [place] [address]    Special Requests   Final    BOTTLES DRAWN AEROBIC AND ANAEROBIC Blood Culture adequate volume Performed at [facility] [address], [place], [place] [address]    Culture   Final    NO GROWTH 5 DAYS Performed at [facility], [place] [address], [place], [place] [address]    Report Status 11/24/2018 FINAL  Final  Surgical pcr screen     Status: None   Collection Time: 11/19/18  5:30 PM   Specimen: Nasal Mucosa; Nasal Swab  Result Value Ref Range Status   MRSA, PCR NEGATIVE NEGATIVE Final    Staphylococcus aureus NEGATIVE NEGATIVE Final    Comment: (NOTE) The Xpert SA Assay (FDA approved for NASAL specimens in patients 17 years of age and older), is one component of a comprehensive surveillance program. It is not intended to diagnose infection nor to guide or monitor treatment. Performed at [facility], [place] [address], [place], [place] [address]          Radiology Studies: No results found.      Scheduled Meds: . aspirin EC  81 mg Oral Daily  . atorvastatin  40 mg Oral Daily  . clopidogrel  75 mg Oral Q breakfast  . collagenase   Topical Daily  . dorzolamide-timolol  1 drop Both Eyes BID  . famotidine  20 mg Oral Daily  . finasteride  5 mg Oral QPM  . insulin aspart  0-9 Units Subcutaneous Q4H  . levothyroxine  50 mcg Oral Q0600  . potassium chloride  10 mEq Oral Daily  . sodium chloride flush  10-40 mL Intracatheter Q12H  . sodium chloride flush  3 mL Intravenous Q12H  . sodium chloride flush  3 mL Intravenous Q12H  . sodium chloride flush  3 mL Intravenous Q12H  . tamsulosin  0.4 mg Oral BID   Continuous Infusions: . sodium chloride 250 mL (11/26/18 1031)  . sodium chloride    . cefTRIAXone (ROCEPHIN)  IV 2 g (11/26/18 1354)  . metronidazole 500 mg (11/26/18 1359)  . vancomycin 1,500 mg (11/26/18 1032)    Assessment & Plan:   Principal Problem:   Toe infection Active Problems:   Peripheral vascular disease ([place])   Essential hypertension, benign   Hyperlipidemia   Aortic valve stenosis   Hypothyroidism   PAF (paroxysmal atrial fibrillation) (HCC)   Type 2 diabetes mellitus with foot ulcer, without long-term current use of insulin (HCC)   Critical limb ischemia with history of revascularization of same extremity   First left toe with dry gangrene/ischemic ulcer and cellulitis in the setting of PVD-with critical limb ischemialeft great toe: Continue with on vancomycin, ceftriaxone. Dosing per pharmacy Blood culture negative x2 -  s/pLE angiogram with revascularization attempt  by Dr. Cheri Guppy a subtotal occlusion of TP trunk felt to be amenable to revascularize but unable to completegiven amount of dye used11/9- Today,S/p Successful complex revascularization of the left  TP trunk into the peroneal artery using 2 overlapping drug-eluting stents as well as drug coated balloon angioplasty of the distal left popliteal artery.  Continue with aspirin and Plavix.    Essential hypertension,benign: Stable  Hyperlipidemia: Continue Lipitor   Hypothyroidism: Continue home levothyroxine 50 MCG.  CKD stage IIIbcreatinine baseline 1-.5 -1.7. stable . Continue with monitoring for contrast-induced nephropathy (status post angiogram as above) as is at high risk for contrast-induced nephropathy.  Moderate aortic stenosis-MG 25 mm hg by echo 08/2018. Cards following  PAF: In NSR:On aspirin and DOAC which held and was on heparin gtt. Per cardiology would not recommend favoring triple therapy and plan to stop aspirin when Eliquis is resumed.   Decrease h/h- Likely dilutional from IV hydration. No signs of bleeding.  However he is on dual antiplatelet therapy and will obtain occult stool   Type 2 diabetes mellitus with foot ulcer, without long-term current use of insulin:BG well controlled Holding home glipizide.  Continue FS check and RISS  BPH-continue Flomax and finasteride.     DVT PPX: SCD Code Status:DNR Family Communication: Spoke to son who is present at bedside today all questions and concerns were answered Disposition Plan: Remains inpatient pending clinical improvement. Possible d/c in 2 days.        LOS: 7 days   Time spent: 45 minutes with more than 50% on [place], MD [facility] Pager [phone]  If 7PM-7AM, please contact night-coverage [email or web address] Password [facility] 11/26/2018, 4:38 PM

## 2018-11-26 NOTE — Consult Note (Signed)
   [facility] CM Inpatient Consult   11/26/2018  DANIL WEDGE [birth year] [id number]   Patient screened for extreme high risk score for unplanned readmission score of 42%  and for less than 30 day re-hospitalizations. 5 hospitalizations in the past 6 months noted in [facility].   Review of patient's medical record reveals patient is an 85 year old post op surgical intervention for PVD with HX of diabetes.  Primary Care Provider is Dr. Redmond School confirmed by soon.  Pharmacy is:  [facility],   Transportation to provider: family    Follow up call with patient's son at the bedside phone [place], HIPAA verified.  [name] states he is with the patient during the week and other family assist and stay on the  weekends. Explained [facility] Care Management for post hospital follow up for complex disease management. Also, explained previous [facility] call regarding medication adherence.  He states patient has no issues with medications but does not use this medication when in the hospital.  He states patient has home health and agrees to [facility] calls.  Plan: Assign patient for post hospital complex disease management.  Denies any issues with Glipizide son states.  For questions contact:   Natividad Brood, RN BSN [facility] Liaison  [phone] business mobile phone Toll free office [phone]  Fax number: [phone] [email or web address]

## 2018-11-26 NOTE — Progress Notes (Signed)
PT Cancellation Note  Patient Details Name: [name] MRN: [id number] DOB: 1927-06-11   Cancelled Treatment:    Reason Eval/Treat Not Completed: Other (comment). Pt declined due to fatigue.    Shary Decamp [facility] 11/26/2018, 2:37 PM [place] Pager [phone] Office [phone]

## 2018-11-27 DIAGNOSIS — L97529 Non-pressure chronic ulcer of other part of left foot with unspecified severity: Secondary | ICD-10-CM | POA: Diagnosis not present

## 2018-11-27 DIAGNOSIS — E11621 Type 2 diabetes mellitus with foot ulcer: Secondary | ICD-10-CM | POA: Diagnosis not present

## 2018-11-27 LAB — BASIC METABOLIC PANEL
Anion gap: 9 (ref 5–15)
BUN: 28 mg/dL — ABNORMAL HIGH (ref 8–23)
CO2: 23 mmol/L (ref 22–32)
Calcium: 8.4 mg/dL — ABNORMAL LOW (ref 8.9–10.3)
Chloride: 106 mmol/L (ref 98–111)
Creatinine, Ser: 1.27 mg/dL — ABNORMAL HIGH (ref 0.61–1.24)
GFR calc Af Amer: 58 mL/min — ABNORMAL LOW (ref >60)
GFR calc non Af Amer: 50 mL/min — ABNORMAL LOW (ref >60)
Glucose, Bld: 113 mg/dL — ABNORMAL HIGH (ref 70–99)
Potassium: 4.2 mmol/L (ref 3.5–5.1)
Sodium: 138 mmol/L (ref 135–145)

## 2018-11-27 LAB — CBC
HCT: 27 % — ABNORMAL LOW (ref 39.0–52.0)
Hemoglobin: 8.9 g/dL — ABNORMAL LOW (ref 13.0–17.0)
MCH: 30.3 pg (ref 26.0–34.0)
MCHC: 33 g/dL (ref 30.0–36.0)
MCV: 91.8 fL (ref 80.0–100.0)
Platelets: 233 10*3/uL (ref 150–400)
RBC: 2.94 MIL/uL — ABNORMAL LOW (ref 4.22–5.81)
RDW: 15 % (ref 11.5–15.5)
WBC: 10.3 10*3/uL (ref 4.0–10.5)
nRBC: 0 % (ref 0.0–0.2)

## 2018-11-27 LAB — GLUCOSE, CAPILLARY
Glucose-Capillary: 108 mg/dL — ABNORMAL HIGH (ref 70–99)
Glucose-Capillary: 113 mg/dL — ABNORMAL HIGH (ref 70–99)
Glucose-Capillary: 130 mg/dL — ABNORMAL HIGH (ref 70–99)
Glucose-Capillary: 137 mg/dL — ABNORMAL HIGH (ref 70–99)
Glucose-Capillary: 166 mg/dL — ABNORMAL HIGH (ref 70–99)

## 2018-11-27 MED ORDER — INSULIN ASPART 100 UNIT/ML ~~LOC~~ SOLN
0.0000 [IU] | Freq: Three times a day (TID) | SUBCUTANEOUS | Status: DC
  Administered 2018-11-28: 3 [IU] via SUBCUTANEOUS
  Administered 2018-11-28 – 2018-11-29 (×3): 2 [IU] via SUBCUTANEOUS
  Administered 2018-11-29: 3 [IU] via SUBCUTANEOUS
  Administered 2018-11-30 (×2): 2 [IU] via SUBCUTANEOUS
  Administered 2018-11-30 – 2018-12-01 (×3): 3 [IU] via SUBCUTANEOUS
  Administered 2018-12-01 – 2018-12-02 (×2): 2 [IU] via SUBCUTANEOUS
  Administered 2018-12-02 – 2018-12-04 (×4): 3 [IU] via SUBCUTANEOUS
  Administered 2018-12-04: 2 [IU] via SUBCUTANEOUS
  Administered 2018-12-05 (×2): 3 [IU] via SUBCUTANEOUS
  Administered 2018-12-06 (×3): 2 [IU] via SUBCUTANEOUS
  Administered 2018-12-07: 3 [IU] via SUBCUTANEOUS
  Administered 2018-12-07: 2 [IU] via SUBCUTANEOUS
  Administered 2018-12-07: 5 [IU] via SUBCUTANEOUS
  Administered 2018-12-08: 3 [IU] via SUBCUTANEOUS
  Administered 2018-12-08: 2 [IU] via SUBCUTANEOUS
  Administered 2018-12-08: 5 [IU] via SUBCUTANEOUS
  Administered 2018-12-09: 3 [IU] via SUBCUTANEOUS
  Administered 2018-12-09: 2 [IU] via SUBCUTANEOUS

## 2018-11-27 MED ORDER — INSULIN ASPART 100 UNIT/ML ~~LOC~~ SOLN
0.0000 [IU] | Freq: Every day | SUBCUTANEOUS | Status: DC
  Administered 2018-12-07: 2 [IU] via SUBCUTANEOUS

## 2018-11-27 MED ORDER — ENOXAPARIN SODIUM 40 MG/0.4ML ~~LOC~~ SOLN
40.0000 mg | Freq: Every day | SUBCUTANEOUS | Status: DC
  Administered 2018-11-27 – 2018-12-02 (×6): 40 mg via SUBCUTANEOUS
  Filled 2018-11-27 (×7): qty 0.4

## 2018-11-27 MED ORDER — AMLODIPINE BESYLATE 5 MG PO TABS
5.0000 mg | ORAL_TABLET | Freq: Every day | ORAL | Status: DC
  Administered 2018-11-27 – 2018-11-29 (×3): 5 mg via ORAL
  Filled 2018-11-27 (×3): qty 1

## 2018-11-27 NOTE — Progress Notes (Signed)
PROGRESS NOTE    [name]  QXI:[id number] DOB: 20-Jul-1927 DOA: 11/18/2018 PCP: Redmond School, MD    Brief Narrative:  85 year old male with HTN, HLD, T2DM CKD stage III, PE AF, diastolic CHF, PAD status post right BKA admitted from home with black first toe on left foot. In the ER x-ray showed evidence of ?osteomyelitis but reviewing final xr, no osteomyelitis was detected and was admitted 11/4 for further management of of dry gangrene cellulitis of first left toe. Patient was placed on Vanco/Zosyn dosing per pharmacy. Recent hospitalization past week with volume overload and diuresed with over 20 pound.    Consultants:  cardiology, orthopedic.   Procedures:  Lower extremity angiography  Antimicrobials:   Vancomycin   ceftriaxone and metronidazole     Subjective: Patient is tearful this a.m. because he heard he may need amputation of his affected foot.  He denies any shortness of breath, chest pain, dizziness, fever or chills.  Objective: Vitals:   11/27/18 0338 11/27/18 0343 11/27/18 0903 11/27/18 0923  BP: (!) 183/77 (!) 183/73 (!) 175/80 (!) 170/69  Pulse: (!) 59 (!) 59 62   Resp: 16 17 19    Temp:   (!) 97.5 F (36.4 C)   TempSrc:   Oral   SpO2: 97% 97% 98%   Weight:  102.8 kg    Height:        Intake/Output Summary (Last 24 hours) at 11/27/2018 1111 Last data filed at 11/27/2018 0700 Gross per 24 hour  Intake 600 ml  Output 1700 ml  Net -1100 ml   Filed Weights   11/25/18 1400 11/26/18 0425 11/27/18 0343  Weight: 102.3 kg 102.8 kg 102.8 kg    Examination: General exam:calm, tearful, sad. Respiratory system: Clear to auscultation. Respiratory effort normal. Cardiovascular system:S1 &S2 heard, RRR. Harsh 3/6 systolic ejection murmur no gallops Gastrointestinal system:Abdomen is nondistended, soft and nontenderNormal bowel sounds heard. Central nervous system:Alert and oriented. No focal neurological deficits. Extremities:Right  growing pulses intact dressing intact no bleeding.Right BKA, left toes black. Mild LLE edema. Anterior shin dressing in place. LLE cold to touch, gangrenous toes Skin:warm, dry Psychiatry:Mood &affect appropriate for current setting    Data Reviewed: I have personally reviewed following labs and imaging studies  CBC: Recent Labs  Lab 11/23/18 0342 11/24/18 0328 11/25/18 0301 11/26/18 0501 11/27/18 0929  WBC 9.5 8.4 11.6* 11.3* 10.3  HGB 10.5* 10.3* 9.6* 8.6* 8.9*  HCT 32.0* 29.9* 29.1* 26.9* 27.0*  MCV 91.7 89.8 92.7 94.1 91.8  PLT 279 263 252 242 034   Basic Metabolic Panel: Recent Labs  Lab 11/23/18 0342 11/24/18 0328 11/25/18 0301 11/26/18 0501 11/27/18 0509  NA 134* 132* 135 137 138  K 4.0 3.5 4.0 3.8 4.2  CL 99 100 103 105 106  CO2 24 22 23 23 23   GLUCOSE 113* 251* 102* 182* 113*  BUN 24* 31* 36* 35* 28*  CREATININE 1.57* 1.43* 1.69* 1.67* 1.27*  CALCIUM 8.9 8.8* 8.6* 8.4* 8.4*   GFR: Estimated Creatinine Clearance: 47.4 mL/min (A) (by C-G formula based on SCr of 1.27 mg/dL (H)). Liver Function Tests: No results for input(s): AST, ALT, ALKPHOS, BILITOT, PROT, ALBUMIN in the last 168 hours. No results for input(s): LIPASE, AMYLASE in the last 168 hours. No results for input(s): AMMONIA in the last 168 hours. Coagulation Profile: No results for input(s): INR, PROTIME in the last 168 hours. Cardiac Enzymes: No results for input(s): CKTOTAL, CKMB, CKMBINDEX, TROPONINI in the last 168 hours. BNP (last 3 results)  No results for input(s): PROBNP in the last 8760 hours. HbA1C: No results for input(s): HGBA1C in the last 72 hours. CBG: Recent Labs  Lab 11/26/18 2030 11/26/18 2126 11/27/18 0043 11/27/18 0348 11/27/18 0758  GLUCAP 181* 178* 130* 108* 113*   Lipid Profile: No results for input(s): CHOL, HDL, LDLCALC, TRIG, CHOLHDL, LDLDIRECT in the last 72 hours. Thyroid Function Tests: No results for input(s): TSH, T4TOTAL, FREET4, T3FREE, THYROIDAB  in the last 72 hours. Anemia Panel: No results for input(s): VITAMINB12, FOLATE, FERRITIN, TIBC, IRON, RETICCTPCT in the last 72 hours. Sepsis Labs: No results for input(s): PROCALCITON, LATICACIDVEN in the last 168 hours.  Recent Results (from the past 240 hour(s))  Blood culture (routine x 2)     Status: None   Collection Time: 11/18/18  9:07 PM   Specimen: BLOOD RIGHT ARM  Result Value Ref Range Status   Specimen Description   Final    BLOOD RIGHT ARM Performed at [facility], [address], [place], [place] [address]    Special Requests   Final    BOTTLES DRAWN AEROBIC ONLY Blood Culture adequate volume Performed at [facility] [address] [place], [place] [address]    Culture   Final    NO GROWTH 5 DAYS Performed at [facility], [place] [address], [place], [place] [address]    Report Status 11/24/2018 FINAL  Final  SARS CORONAVIRUS 2 (TAT 6-24 HRS) Nasopharyngeal Nasopharyngeal Swab     Status: None   Collection Time: 11/18/18 10:04 PM   Specimen: Nasopharyngeal Swab  Result Value Ref Range Status   SARS Coronavirus 2 NEGATIVE NEGATIVE Final    Comment: (NOTE) SARS-CoV-2 target nucleic acids are NOT DETECTED. The SARS-CoV-2 RNA is generally detectable in upper and lower respiratory specimens during the acute phase of infection. Negative results do not preclude SARS-CoV-2 infection, do not rule out co-infections with other pathogens, and should not be used as the sole basis for treatment or other patient management decisions. Negative results must be combined with clinical observations, patient history, and epidemiological information. The expected result is Negative. Fact Sheet for Patients: SugarRoll.be Fact Sheet for Healthcare Providers: https://www.woods-mathews.com/ This test is not yet approved or cleared by the [place] FDA and  has been authorized for detection and/or diagnosis  of SARS-CoV-2 by FDA under an Emergency Use Authorization (EUA). This EUA will remain  in effect (meaning this test can be used) for the duration of the COVID-19 declaration under Section 56 4(b)(1) of the Act, 21 U.S.C. section 360bbb-3(b)(1), unless the authorization is terminated or revoked sooner. Performed at [facility], [place] [address], [place], [place] [address]   Blood culture (routine x 2)     Status: None   Collection Time: 11/18/18 10:09 PM   Specimen: BLOOD  Result Value Ref Range Status   Specimen Description   Final    BLOOD RIGHT ANTECUBITAL Performed at [facility] [address], [place], [place] [address]    Special Requests   Final    BOTTLES DRAWN AEROBIC AND ANAEROBIC Blood Culture adequate volume Performed at [facility] [address] [place], [place] [address]    Culture   Final    NO GROWTH 5 DAYS Performed at [facility], [place] [address] [place], [place] [address]    Report Status 11/24/2018 FINAL  Final  Surgical pcr screen     Status: None   Collection Time:  11/19/18  5:30 PM   Specimen: Nasal Mucosa; Nasal Swab  Result Value Ref Range Status   MRSA, PCR NEGATIVE NEGATIVE Final   Staphylococcus aureus NEGATIVE NEGATIVE Final    Comment: (NOTE) The Xpert SA Assay (FDA approved for NASAL specimens in patients 22 years of age and older), is one component of a comprehensive surveillance program. It is not intended to diagnose infection nor to guide or monitor treatment. Performed at [facility], [place] [address] [place], [place] [address]          Radiology Studies: No results found.      Scheduled Meds: . amLODipine  5 mg Oral Daily  . aspirin EC  81 mg Oral Daily  . atorvastatin  40 mg Oral Daily  . clopidogrel  75 mg Oral Q breakfast  . collagenase   Topical Daily  . dorzolamide-timolol  1 drop Both Eyes BID  . famotidine  20 mg Oral Daily  . finasteride  5 mg Oral  QPM  . insulin aspart  0-9 Units Subcutaneous Q4H  . levothyroxine  50 mcg Oral Q0600  . potassium chloride  10 mEq Oral Daily  . sodium chloride flush  10-40 mL Intracatheter Q12H  . sodium chloride flush  3 mL Intravenous Q12H  . sodium chloride flush  3 mL Intravenous Q12H  . sodium chloride flush  3 mL Intravenous Q12H  . tamsulosin  0.4 mg Oral BID   Continuous Infusions: . sodium chloride 250 mL (11/26/18 1031)  . sodium chloride    . cefTRIAXone (ROCEPHIN)  IV 2 g (11/26/18 1354)  . metronidazole 500 mg (11/27/18 0530)  . vancomycin 1,500 mg (11/26/18 1032)    Assessment & Plan:   Principal Problem:   Toe infection Active Problems:   Peripheral vascular disease ([place])   Essential hypertension, benign   Hyperlipidemia   Aortic valve stenosis   Hypothyroidism   PAF (paroxysmal atrial fibrillation) (HCC)   Type 2 diabetes mellitus with foot ulcer, without long-term current use of insulin (HCC)   Critical limb ischemia with history of revascularization of same extremity   First left toe with dry gangrene/ischemic ulcer and cellulitis in the setting of PVD-with critical limb ischemialeft great OHY:WVPXTGGY withon  Blood culture negativex2 - s/pLEangiogram with revascularization attemptby Dr. Cheri Guppy a subtotal occlusion of TP trunk felt to be amenable to revascularize but unable to completegiven amount of dye used11/9- -S/pSuccessful complex revascularization of the left TP trunk into the peroneal artery using 2 overlapping drug-eluting stents as well as drug coated balloon angioplasty of the distal left popliteal artery. Continue with aspirin and Plavix.   Sophronia Simas input appreciated- rec. surgical intervention which  would be a transtibial amputation. Since pt is MRSA negative will d/c vanco.  Essential hypertension,benign: mildly elevated this am,but pt very emotional over needing foot surgery, will continue to monitor.  Hyperlipidemia:Continue  Lipitor  Hypothyroidism:Continue levothyroxine 50 MCG.  CKD stage IIIbcreatinine baseline 1-.5 -1.7. stable . No evidence of contrast-induced nephropathy as his creatinine has improved, today is 1.27 Will continue to monitor.   Moderate aortic stenosis-MG25 mm hg by echo 08/2018. Cards following  PAF: In NSR :On aspirin and DOAC whichheld and was on heparin gtt. Per cardiology would not recommend favoring triple therapy and plan to stop aspirin when Eliquis is resumed. If no surgery will consider ac   Decrease h/h- Likely dilutional from IV hydration. No signs of bleeding.   occult stool pending   Type 2 diabetes mellitus with  foot ulcer, without long-term current use of insulin:BGwell controlled Holding home glipizide.  ContinueFS check and RISS  BPH-continue Flomax and finasteride.     DVT PPX: SCD Code Status:DNR Family Communication:no one at bedside today Disposition Plan: Will need >2MN stays as pt may need surgery and needs to be medically stable.         LOS: 8 days   Time spent: 45 minutes and more than 50% on [place], MD [facility] Pager [phone]  If 7PM-7AM, please contact night-coverage [email or web address] Password [email or web address] 11/27/2018, 11:11 AM

## 2018-11-27 NOTE — Progress Notes (Signed)
Patient ID: [name], male   DOB: 1927-05-22, 85 y.o.   MRN: [id number] Patient is status post revascularization to the left lower extremity.  On examination patient has progressive microcirculatory dysfunction of the left foot.  His left midfoot and forefoot are cold and mottled to the touch.  Patient has progressive gangrenous changes of all toes with a ischemic ulcer over the lateral aspect of the fifth metatarsal head.  I discussed these findings with the patient's son and feel that a transmetatarsal amputation has a less than 50% chance of healing.  Discussed that since patient has no clinical signs of infection we can follow this and see how it progresses.  I will reevaluate Saturday morning.  At this time I feel the patient's best option for 1 surgical intervention would be a transtibial amputation.  Patient's calf is warmer and has excellent circulation.

## 2018-11-27 NOTE — Evaluation (Signed)
Physical Therapy Evaluation Patient Details Name: [name] MRN: [id number] DOB: 21-Dec-1927 Today's Date: 11/27/2018   History of Present Illness  85 y.o. male was admitted with L foot toe infection after a recent amputation BK on RLE in Aug 2020.  Pt's son reports a compression garment apparently distressed the circulation on LLE, resulting in skin breakdown.  Cleared for Covid, has no osteomyelitis on L toes or foot.  Recieved on 11/9 US guided R common fem access with placemetn of NAV6 distal protection device L popliteal artery, atherectomy distal L SFA and popliteal artery;  11/11 had PV angiogram with successful revascularization of L TP trunk into peroneal artery and balloon angioplasty distal L popliteal artery.  PMHx:  HTN, PAF, OSA, DM, CKD 3, PVD, R BK amputation, LLE cellulitis forefoot  Clinical Impression  Pt was seen for mobility and strength assessment, noted his limitations of strength and control of all balance that had worsened since his infection of L foot.  Pt has been only able to stand with family to get to wc, and due to his recent trip to [place] for R BK amp rehab, would expect him to work on transfers mainly as he is still waiting for prosthesis to be finished.  Apparently was fitted before being fully diuresed and was not able to wear the leg.  Follow acutely for these needs of strengthening, balance training and increased endurance for all mobility to increase his tolerance for transfers to chair.    Follow Up Recommendations CIR    Equipment Recommendations  None recommended by PT    Recommendations for Other Services Rehab consult     Precautions / Restrictions Precautions Precautions: Fall Precaution Comments: cellulitis with gangrene L toes Required Braces or Orthoses: Other Brace Other Brace: cast shoe to protect L foot Restrictions Weight Bearing Restrictions: No Other Position/Activity Restrictions: L BK amp is healed      Mobility  Bed  Mobility Overal bed mobility: Needs Assistance Bed Mobility: Supine to Sit;Sit to Supine     Supine to sit: Max assist Sit to supine: Total assist   General bed mobility comments: pt is very weak in hips  Transfers Overall transfer level: Needs assistance Equipment used: 1 person hand held assist Transfers: Sit to/from Stand;Lateral/Scoot Transfers Sit to Stand: Total assist        Lateral/Scoot Transfers: Max assist General transfer comment: cannot stand up on LLE, weak to scoot up the bed  Ambulation/Gait             General Gait Details: not ambulatory yet since amp of R LE  Stairs            Wheelchair Mobility    Modified Rankin (Stroke Patients Only)       Balance   Sitting-balance support: Single extremity supported;Bilateral upper extremity supported Sitting balance-Leahy Scale: Poor       Standing balance-Leahy Scale: Zero                               Pertinent Vitals/Pain Pain Assessment: Faces Faces Pain Scale: Hurts a little bit Pain Location: L foot Pain Descriptors / Indicators: Guarding Pain Intervention(s): Limited activity within patient's tolerance;Monitored during session;Repositioned    Home Living Family/patient expects to be discharged to:: Private residence Living Arrangements: Children Available Help at Discharge: Family;Available 24 hours/day Type of Home: House Home Access: Ramped entrance     Home Layout: One level Home Equipment:  Walker - 2 wheels;Wheelchair - [employer];Hospital bed Additional Comments: hoyer lift    Prior Function Level of Independence: Needs assistance   Gait / Transfers Assistance Needed: has been assisted to [facility] with assist since his amp, has not been able to stand yet  ADL's / Homemaking Assistance Needed: family assists bathing and dressing        Hand Dominance   Dominant Hand: Right    Extremity/Trunk Assessment   Upper Extremity Assessment Upper  Extremity Assessment: Overall WFL for tasks assessed    Lower Extremity Assessment Lower Extremity Assessment: Generalized weakness    Cervical / Trunk Assessment Cervical / Trunk Assessment: Normal  Communication   Communication: HOH  Cognition Arousal/Alertness: Awake/alert Behavior During Therapy: WFL for tasks assessed/performed Overall Cognitive Status: Within Functional Limits for tasks assessed                                        General Comments General comments (skin integrity, edema, etc.): pt cannot assist with any mobility without dense cues and assist to move his trunk  and pull up to sit from sidelying.  Very weak and requires 2 person help to scoot up in bed    Exercises     Assessment/Plan    PT Assessment Patient needs continued PT services  PT Problem List Decreased strength;Decreased range of motion;Decreased activity tolerance;Decreased balance;Decreased mobility;Decreased coordination;Decreased knowledge of use of DME;Decreased safety awareness;Cardiopulmonary status limiting activity;Decreased skin integrity;Pain;Obesity       PT Treatment Interventions DME instruction;Functional mobility training;Therapeutic activities;Therapeutic exercise;Balance training;Neuromuscular re-education;Patient/family education    PT Goals (Current goals can be found in the Care Plan section)  Acute Rehab PT Goals Patient Stated Goal: home eventually PT Goal Formulation: With patient/family Time For Goal Achievement: 12/11/18 Potential to Achieve Goals: Fair    Frequency Min 3X/week   Barriers to discharge Inaccessible home environment;Decreased caregiver support home with children but one at a time    Co-evaluation               AM-PAC PT "6 Clicks" Mobility  Outcome Measure Help needed turning from your back to your side while in a flat bed without using bedrails?: A Lot Help needed moving from lying on your back to sitting on the side of  a flat bed without using bedrails?: A Lot Help needed moving to and from a bed to a chair (including a wheelchair)?: Total Help needed standing up from a chair using your arms (e.g., wheelchair or bedside chair)?: Total Help needed to walk in hospital room?: Total Help needed climbing 3-5 steps with a railing? : Total 6 Click Score: 8    End of Session Equipment Utilized During Treatment: Gait belt Activity Tolerance: Patient limited by fatigue;Treatment limited secondary to medical complications (Comment) Patient left: in bed;with call bell/phone within reach;with bed alarm set;with family/visitor present Nurse Communication: Mobility status PT Visit Diagnosis: Muscle weakness (generalized) (M62.81);Difficulty in walking, not elsewhere classified (R26.2);Adult, failure to thrive (R62.7);Pain Pain - Right/Left: Left Pain - part of body: Ankle and joints of foot    Time: 1210-1237 PT Time Calculation (min) (ACUTE ONLY): 27 min   Charges:   PT Evaluation $PT Eval Moderate Complexity: 1 Mod PT Treatments $Therapeutic Activity: 8-22 mins       Ramond Dial 11/27/2018, 5:30 PM   Mee Hives, PT MS Acute Rehab Dept. Number: [phone] and [facility]  [phone]   

## 2018-11-27 NOTE — Progress Notes (Signed)
Orthopedic Tech Progress Note Patient Details:  [name] [birth year] [id number]  Ortho Devices Type of Ortho Device: Postop shoe/boot Ortho Device/Splint Location: LLE Ortho Device/Splint Interventions: Adjustment, Application, Ordered   Post Interventions Patient Tolerated: Well Instructions Provided: Care of device, Adjustment of device   [name] 11/27/2018, 1:04 PM

## 2018-11-27 NOTE — Progress Notes (Addendum)
Progress Note  Patient Name: [name] Date of Encounter: 11/27/2018  Primary Cardiologist: Dr. Lyman Bishop, MD  Subjective   Fatigued.  Denies chest or foot pain.  Awaiting left foot amputation.  Inpatient Medications    Scheduled Meds: . aspirin EC  81 mg Oral Daily  . atorvastatin  40 mg Oral Daily  . clopidogrel  75 mg Oral Q breakfast  . collagenase   Topical Daily  . dorzolamide-timolol  1 drop Both Eyes BID  . famotidine  20 mg Oral Daily  . finasteride  5 mg Oral QPM  . insulin aspart  0-9 Units Subcutaneous Q4H  . levothyroxine  50 mcg Oral Q0600  . potassium chloride  10 mEq Oral Daily  . sodium chloride flush  10-40 mL Intracatheter Q12H  . sodium chloride flush  3 mL Intravenous Q12H  . sodium chloride flush  3 mL Intravenous Q12H  . sodium chloride flush  3 mL Intravenous Q12H  . tamsulosin  0.4 mg Oral BID   Continuous Infusions: . sodium chloride 250 mL (11/26/18 1031)  . sodium chloride    . cefTRIAXone (ROCEPHIN)  IV 2 g (11/26/18 1354)  . metronidazole 500 mg (11/27/18 0530)  . vancomycin 1,500 mg (11/26/18 1032)   PRN Meds: sodium chloride, sodium chloride, acetaminophen, hydrALAZINE, morphine injection, ondansetron (ZOFRAN) IV, sodium chloride flush, sodium chloride flush, sodium chloride flush   Vital Signs    Vitals:   11/26/18 2127 11/27/18 0046 11/27/18 0338 11/27/18 0343  BP: (!) 173/70 (!) 183/76 (!) 183/77 (!) 183/73  Pulse: (!) 55 (!) 59 (!) 59 (!) 59  Resp: 15 16 16 17   Temp: 97.8 F (36.6 C)     TempSrc: Oral     SpO2:  96% 97% 97%  Weight:    102.8 kg  Height:        Intake/Output Summary (Last 24 hours) at 11/27/2018 0739 Last data filed at 11/27/2018 0700 Gross per 24 hour  Intake 850 ml  Output 2350 ml  Net -1500 ml   Filed Weights   11/25/18 1400 11/26/18 0425 11/27/18 0343  Weight: 102.3 kg 102.8 kg 102.8 kg    Physical Exam   General: Frail, elderly, NAD Skin: Ischemic appearing left great to,  reddening to the left lower extremity Neck: Negative for carotid bruits. No JVD Lungs:Clear to ausculation bilaterally. No wheeze Cardiovascular: RRR with S1 S2. +  murmur Abdomen: Soft, non-tender, non-distended. No obvious abdominal masses. Extremities: No edema.  Neuro: Alert and oriented. No focal deficits. No facial asymmetry. MAE spontaneously. Psych: Responds to questions appropriately with normal affect.    Labs    Chemistry Recent Labs  Lab 11/25/18 0301 11/26/18 0501 11/27/18 0509  NA 135 137 138  K 4.0 3.8 4.2  CL 103 105 106  CO2 23 23 23   GLUCOSE 102* 182* 113*  BUN 36* 35* 28*  CREATININE 1.69* 1.67* 1.27*  CALCIUM 8.6* 8.4* 8.4*  GFRNONAA 35* 36* 50*  GFRAA 41* 41* 58*  ANIONGAP 9 9 9      Hematology Recent Labs  Lab 11/24/18 0328 11/25/18 0301 11/26/18 0501  WBC 8.4 11.6* 11.3*  RBC 3.33* 3.14* 2.86*  HGB 10.3* 9.6* 8.6*  HCT 29.9* 29.1* 26.9*  MCV 89.8 92.7 94.1  MCH 30.9 30.6 30.1  MCHC 34.4 33.0 32.0  RDW 14.0 14.6 14.6  PLT 263 252 242    Cardiac EnzymesNo results for input(s): TROPONINI in the last 168 hours. No results for  input(s): TROPIPOC in the last 168 hours.   BNPNo results for input(s): BNP, PROBNP in the last 168 hours.   DDimer No results for input(s): DDIMER in the last 168 hours.   Radiology    No results found.  Telemetry    11/27/2018, NSR, HR 60s to 70s- Personally Reviewed  ECG    No new tracing since 11/23/2018- Personally Reviewed  Cardiac Studies   PV angiogram: 11/23/18  Procedures Performed: 1. Ultrasound-guided right common femoral access/contralateral access 2. Placement of an NAV6 distal protection device below the knee left popliteal artery 3. Diamondback orbital rotational atherectomy distal left SFA and popliteal artery 4. DCB distal left SFA/popliteal artery  Final Impression:Successful diamondback orbital rotational atherectomy, DCB  using distal protection of a highly calcified subtotally occluded distal left SFA and popliteal artery in the setting of critical limb ischemia. The dominant vessel below the knee is a peroneal. There does seem to be a subtotally occluded tibioperoneal trunk which could be percutaneously revascularizable. Because had use 160 cc did die and his creatinine clearance was in the 35 mL/h range I decided to terminate the procedure. He will need to be gently hydrated overnight and treated with antiplatelet therapy. He may ultimately really need a staged peroneal intervention.  [name], [facility]  PV angiogram: 11/25/18  Successful complex revascularization of the left TP trunk into the peroneal artery using 2 overlapping drug-eluting stents as well as drug coated balloon angioplasty of the distal left popliteal artery. The occlusion was crossed retrograde via the peroneal artery.  Recommendations: Continue dual antiplatelet therapy. Hydrate overnight as he is high risk for contrast-induced nephropathy. The patient received excessive amount of radiation and will be counseled.  Patient Profile     85 y.o. male with a history of HTN, HLD, PAF, OSA, CKD III, DM and PVD with stent to Lt SFA 05/2010 and after infection of Rt Gt toe with multiple rounds of ABX Dopplers with ABI of 0.42 on the Rt and follow up PV angio but eventual Rt BKA 08/2018 presented 11/18/2018 with worsening ischemic ulcer to Lt great toe with cellulitis of the left forefoot.  He was [place] admissionwith volume overload and was diuresed over 20 pounds.  Assessment & Plan    1.  PAF: -Maintaining NSR with rates in the 60-70 range -Eliquis and heparin gtt held 11/26/2018>> recommendations if no plans for amputation surgery consider resuming -Awaiting Dr. Jess Barters recommendations, progress note for further direction  2.  Critical limb ischemia of the left great toe::  -Evaluated by Dr. Gwenlyn Found and underwent PV angiogram with successful arthrectomy to occluded left SFA and popliteal artery 11/23/2018 -Underwent staged intervention with Dr. Sophronia Simas 11/26/2018 -Has been on DAPT with ASA and Plavix>>> no triple therapy in the setting of advanced age -When Eliquis is resumed, plan to stop ASA -Awaiting Dr. Jess Barters recommendations as above -If surgery planned for early next week, Monday or Tuesday we will plan for continued ASA and Plavix therapy as Eliquis would need to be held at least 48 hours prior to procedure  3.  CKD stage III: -Creatinine, 1.27 today>> down from 1.67 yesterday -Appears euvolemic  4.  Moderate AS: -Last echocardiogram from 08/2018 with mean gradient of 25 mmHg -No syncope  5.  HTN: -Elevated 183/73> 183/77> 183/76 -We will start amlodipine 5 for elevated BPs -No ACE/ARB in the setting of renal dysfunction  6.  Anemia: -Hemoglobin, 10.3 on admission with downtrend to -No acute bleeding -We  will order CBC today    Signed, Kathyrn Drown NP-C [facility] Pager: [phone] 11/27/2018, 7:39 AM     For questions or updates, please contact   Please consult [email or web address] for contact info under Cardiology/STEMI.  Patient seen, examined. Available data reviewed. Agree with findings, assessment, and plan as outlined by Kathyrn Drown, NP.  Elderly male in no distress.  Lungs clear, heart regular rate and rhythm with 3/6 systolic murmur at the right upper sternal border, abdomen soft and nontender, extremity exam unchanged.  The lower leg is warm with toe necrosis unchanged.  Consideration of transtibial amputation versus a period of observation noted.  Patient currently on IV heparin in case he decides for amputation during this hospital stay.  Cardiac status stable.  Sherren Mocha, M.D. 11/27/2018 11:25 AM

## 2018-11-28 DIAGNOSIS — E11621 Type 2 diabetes mellitus with foot ulcer: Secondary | ICD-10-CM | POA: Diagnosis not present

## 2018-11-28 DIAGNOSIS — L97529 Non-pressure chronic ulcer of other part of left foot with unspecified severity: Secondary | ICD-10-CM | POA: Diagnosis not present

## 2018-11-28 LAB — GLUCOSE, CAPILLARY: Glucose-Capillary: 166 mg/dL — ABNORMAL HIGH (ref 70–99)

## 2018-11-28 MED ORDER — FUROSEMIDE 20 MG PO TABS
20.0000 mg | ORAL_TABLET | Freq: Every day | ORAL | Status: DC
  Administered 2018-11-28 – 2018-12-09 (×12): 20 mg via ORAL
  Filled 2018-11-28 (×12): qty 1

## 2018-11-28 NOTE — Progress Notes (Signed)
Patient ID: [name], male   DOB: 1927/11/10, 85 y.o.   MRN: [id number] Examination shows progressive ischemic changes over the ulcer on the fifth metatarsal head left foot.  The toes also have progressive ischemic changes and are cold mottled and black.  Patient does have good warmth to the midfoot.  I spoke with the patient's son this morning and he would like to proceed with a transmetatarsal amputation on Wednesday.  I feel it safe to wait until Wednesday there is no cellulitis no signs of infection.

## 2018-11-28 NOTE — H&P (View-Only) (Signed)
Patient ID: [name], male   DOB: Dec 01, 1927, 85 y.o.   MRN: [id number] Examination shows progressive ischemic changes over the ulcer on the fifth metatarsal head left foot.  The toes also have progressive ischemic changes and are cold mottled and black.  Patient does have good warmth to the midfoot.  I spoke with the patient's son this morning and he would like to proceed with a transmetatarsal amputation on Wednesday.  I feel it safe to wait until Wednesday there is no cellulitis no signs of infection.

## 2018-11-28 NOTE — Progress Notes (Signed)
PROGRESS NOTE    [name]  ZOX:[id number] DOB: October 04, 1927 DOA: 11/18/2018 PCP: Redmond School, MD   Brief Narrative: 85 year old male with HTN, HLD, T2DM CKD stage III, PE AF, diastolic CHF, PAD status post right BKA admitted from home with black first toe on left foot. In the ER x-ray showed evidence of?osteomyelitisbut reviewing final xr, no osteomyelitis was detectedand was admitted 11/4 for further management of of dry gangrene cellulitis of first left toe. Patientwas placedon Vanco/Zosyn dosing per pharmacy. Recent hospitalization past week with volume overload and diuresed with over 20 pound.    Consultants: cardiology, orthopedic.   Procedures: Lower extremity angiography  Antimicrobials:  Vancomycin   ceftriaxone and metronidazole    Subjective: Patient reports pain controlled.  Denies shortness of breath, chest pain, and reports mood is better today.  Has no new complaints  Objective: Vitals:   11/27/18 0923 11/27/18 2049 11/28/18 0536 11/28/18 0541  BP: (!) 170/69 (!) 168/68 (!) 161/72   Pulse:  62 (!) 58   Resp:  17 18   Temp:  97.8 F (36.6 C) 97.9 F (36.6 C)   TempSrc:  Axillary Oral   SpO2:  97% 98%   Weight:    100 kg  Height:        Intake/Output Summary (Last 24 hours) at 11/28/2018 0740 Last data filed at 11/28/2018 0500 Gross per 24 hour  Intake 360 ml  Output 1650 ml  Net -1290 ml   Filed Weights   11/26/18 0425 11/27/18 0343 11/28/18 0541  Weight: 102.8 kg 102.8 kg 100 kg    Examination: General exam:Sitting in bed, appears mildly sob with conversation NAD, appears nontoxic Respiratory system: Clear to auscultation. Respiratory effort normal. Cardiovascular system:S1 &S2 heard, RRR. Harsh 3/6 systolic ejection murmur no gallops Gastrointestinal system:Abdomen is nondistended, soft and nontenderNormal bowel sounds heard. Central nervous system:Alert and oriented. No focal neurological deficits.  Extremities:Right growing pulses intact dressing intact no bleeding.Right BKA, left toes black. Mild LLE edema. Anterior shin dressing in place. LLE cold to touch, gangrenous toes Skin:warm, dry Psychiatry:Mood &affect appropriate for current setting  Data Reviewed: I have personally reviewed following labs and imaging studies  CBC: Recent Labs  Lab 11/23/18 0342 11/24/18 0328 11/25/18 0301 11/26/18 0501 11/27/18 0929  WBC 9.5 8.4 11.6* 11.3* 10.3  HGB 10.5* 10.3* 9.6* 8.6* 8.9*  HCT 32.0* 29.9* 29.1* 26.9* 27.0*  MCV 91.7 89.8 92.7 94.1 91.8  PLT 279 263 252 242 811   Basic Metabolic Panel: Recent Labs  Lab 11/23/18 0342 11/24/18 0328 11/25/18 0301 11/26/18 0501 11/27/18 0509  NA 134* 132* 135 137 138  K 4.0 3.5 4.0 3.8 4.2  CL 99 100 103 105 106  CO2 24 22 23 23 23   GLUCOSE 113* 251* 102* 182* 113*  BUN 24* 31* 36* 35* 28*  CREATININE 1.57* 1.43* 1.69* 1.67* 1.27*  CALCIUM 8.9 8.8* 8.6* 8.4* 8.4*   GFR: Estimated Creatinine Clearance: 46.7 mL/min (A) (by C-G formula based on SCr of 1.27 mg/dL (H)). Liver Function Tests: No results for input(s): AST, ALT, ALKPHOS, BILITOT, PROT, ALBUMIN in the last 168 hours. No results for input(s): LIPASE, AMYLASE in the last 168 hours. No results for input(s): AMMONIA in the last 168 hours. Coagulation Profile: No results for input(s): INR, PROTIME in the last 168 hours. Cardiac Enzymes: No results for input(s): CKTOTAL, CKMB, CKMBINDEX, TROPONINI in the last 168 hours. BNP (last 3 results) No results for input(s): PROBNP in the last 8760 hours. HbA1C: No  results for input(s): HGBA1C in the last 72 hours. CBG: Recent Labs  Lab 11/27/18 0043 11/27/18 0348 11/27/18 0758 11/27/18 1127 11/27/18 1700  GLUCAP 130* 108* 113* 166* 137*   Lipid Profile: No results for input(s): CHOL, HDL, LDLCALC, TRIG, CHOLHDL, LDLDIRECT in the last 72 hours. Thyroid Function Tests: No results for input(s): TSH, T4TOTAL, FREET4,  T3FREE, THYROIDAB in the last 72 hours. Anemia Panel: No results for input(s): VITAMINB12, FOLATE, FERRITIN, TIBC, IRON, RETICCTPCT in the last 72 hours. Sepsis Labs: No results for input(s): PROCALCITON, LATICACIDVEN in the last 168 hours.  Recent Results (from the past 240 hour(s))  Blood culture (routine x 2)     Status: None   Collection Time: 11/18/18  9:07 PM   Specimen: BLOOD RIGHT ARM  Result Value Ref Range Status   Specimen Description   Final    BLOOD RIGHT ARM Performed at [facility], [address], [place], [place] [address]    Special Requests   Final    BOTTLES DRAWN AEROBIC ONLY Blood Culture adequate volume Performed at [facility] [address] [place], [place] [address]    Culture   Final    NO GROWTH 5 DAYS Performed at [facility], [place] [address] [place], [place] [address]    Report Status 11/24/2018 FINAL  Final  SARS CORONAVIRUS 2 (TAT 6-24 HRS) Nasopharyngeal Nasopharyngeal Swab     Status: None   Collection Time: 11/18/18 10:04 PM   Specimen: Nasopharyngeal Swab  Result Value Ref Range Status   SARS Coronavirus 2 NEGATIVE NEGATIVE Final    Comment: (NOTE) SARS-CoV-2 target nucleic acids are NOT DETECTED. The SARS-CoV-2 RNA is generally detectable in upper and lower respiratory specimens during the acute phase of infection. Negative results do not preclude SARS-CoV-2 infection, do not rule out co-infections with other pathogens, and should not be used as the sole basis for treatment or other patient management decisions. Negative results must be combined with clinical observations, patient history, and epidemiological information. The expected result is Negative. Fact Sheet for Patients: SugarRoll.be Fact Sheet for Healthcare Providers: https://www.woods-mathews.com/ This test is not yet approved or cleared by the [place] FDA and  has been authorized for detection  and/or diagnosis of SARS-CoV-2 by FDA under an Emergency Use Authorization (EUA). This EUA will remain  in effect (meaning this test can be used) for the duration of the COVID-19 declaration under Section 56 4(b)(1) of the Act, 21 U.S.C. section 360bbb-3(b)(1), unless the authorization is terminated or revoked sooner. Performed at [facility], [place] [address], [place], [place] [address]   Blood culture (routine x 2)     Status: None   Collection Time: 11/18/18 10:09 PM   Specimen: BLOOD  Result Value Ref Range Status   Specimen Description   Final    BLOOD RIGHT ANTECUBITAL Performed at [facility] [address], [place], [place] [address]    Special Requests   Final    BOTTLES DRAWN AEROBIC AND ANAEROBIC Blood Culture adequate volume Performed at [facility] [address], [place], [place] [address]    Culture   Final    NO GROWTH 5 DAYS Performed at [facility], [place] [address] [place],  [address]    Report Status 11/24/2018 FINAL  Final  Surgical pcr screen     Status: None   Collection Time: 11/19/18  5:30 PM   Specimen: Nasal Mucosa; Nasal Swab  Result Value Ref Range Status   MRSA, PCR NEGATIVE NEGATIVE Final   Staphylococcus aureus NEGATIVE NEGATIVE Final    Comment: (NOTE) The Xpert SA Assay (FDA approved for NASAL specimens in patients 48 years of age and older), is one component of a comprehensive surveillance program. It is not intended to diagnose infection nor to guide or monitor treatment. Performed at [facility], [place] [address], [place], [place] [address]          Radiology Studies: No results found.      Scheduled Meds: . amLODipine  5 mg Oral Daily  . aspirin EC  81 mg Oral Daily  . atorvastatin  40 mg Oral Daily  . clopidogrel  75 mg Oral Q breakfast  . collagenase   Topical Daily  . dorzolamide-timolol  1 drop Both Eyes BID  . enoxaparin (LOVENOX) injection  40 mg  Subcutaneous Daily  . famotidine  20 mg Oral Daily  . finasteride  5 mg Oral QPM  . insulin aspart  0-15 Units Subcutaneous TID WC  . insulin aspart  0-5 Units Subcutaneous QHS  . levothyroxine  50 mcg Oral Q0600  . potassium chloride  10 mEq Oral Daily  . sodium chloride flush  10-40 mL Intracatheter Q12H  . sodium chloride flush  3 mL Intravenous Q12H  . sodium chloride flush  3 mL Intravenous Q12H  . sodium chloride flush  3 mL Intravenous Q12H  . tamsulosin  0.4 mg Oral BID   Continuous Infusions: . sodium chloride 250 mL (11/26/18 1031)  . sodium chloride    . cefTRIAXone (ROCEPHIN)  IV 2 g (11/27/18 1625)  . metronidazole 500 mg (11/28/18 0546)    Assessment & Plan:   Principal Problem:   Toe infection Active Problems:   Peripheral vascular disease ([place])   Essential hypertension, benign   Hyperlipidemia   Aortic valve stenosis   Hypothyroidism   PAF (paroxysmal atrial fibrillation) (HCC)   Type 2 diabetes mellitus with foot ulcer, without long-term current use of insulin (HCC)   Critical limb ischemia with history of revascularization of same extremity   First left toe with dry gangrene/ischemic ulcer and cellulitis in the setting of PVD-with critical limb ischemialeft great YJE:[id number] withon  Blood culture negativex2 - -S/pSuccessful complex revascularization of the left TP trunk into the peroneal artery using 2 overlapping drug-eluting stents as well as drug coated balloon angioplasty of the distal left popliteal artery.    -Vanco was discontinued as patient is MRSA negative Continue with aspirin and Plavix. -Continue dressing care -Per Ortho plan for transmetatarsal amputation on Wednesday   Essential hypertension,benign: BP labile at times it is elevated. He was on Lasix which was held and will restart at a lower dose Lasix 20 mg daily monitor BP and if need to will manage accordingly   Hyperlipidemia:Continue Lipitor   Hypothyroidism:Continue levothyroxine 50 MCG.  CKD stage IIIbcreatinine baseline 1-.5 -1.7. stable. No evidence of contrast-induced nephropathy as his creatinine has improved,  Since resuming Lasix at a lower dose we will continue to monitor   Moderate aortic stenosis-MG25 mm hg by echo 08/2018. Cards following  PAF:In NSR -On aspirin and DOAC whichheld and was on heparin gtt. -Per cardiology would not recommend favoring triple therapy and plan to stop aspirin when Eliquis is resumed.    Decrease h/h- Likely dilutional from IV hydration. No signs of bleeding.  -occult stool still pending   Type 2 diabetes mellitus with foot ulcer, without long-term current use  of insulin- BGwell controlled Holding home glipizide.  ContinueFS check and RISS  BPH-continue Flomax and finasteride.     DVT PPX: SCD Code Status:DNR Family Communication:no one at bedside today Disposition Plan: Will need >2MN stays , surgery next week           LOS: 9 days   Time spent: 45 minutes with more than 50% on COC.  Assessment and plans above discussed with patient this AM.    Nolberto Hanlon, MD [facility] Pager [phone]  If 7PM-7AM, please contact night-coverage [email or web address] Password [email or web address] 11/28/2018, 7:40 AM

## 2018-11-29 LAB — GLUCOSE, CAPILLARY
Glucose-Capillary: 125 mg/dL — ABNORMAL HIGH (ref 70–99)
Glucose-Capillary: 133 mg/dL — ABNORMAL HIGH (ref 70–99)

## 2018-11-29 MED ORDER — AMLODIPINE BESYLATE 10 MG PO TABS
10.0000 mg | ORAL_TABLET | Freq: Every day | ORAL | Status: DC
  Administered 2018-11-29 – 2018-12-09 (×11): 10 mg via ORAL
  Filled 2018-11-29 (×11): qty 1

## 2018-11-29 NOTE — Progress Notes (Signed)
PROGRESS NOTE    [name]  TDV:[id number] DOB: [birth year] DOA: 11/18/2018 PCP: Redmond School, MD    Brief Narrative:  85 year old male with HTN, HLD, T2DM CKD stage III, PE AF, diastolic CHF, PAD status post right BKA admitted from home with black first toe on left foot. In the ER x-ray showed evidence of?osteomyelitisbut reviewing final xr, no osteomyelitis was detectedand was admitted 11/4 for further management of of dry gangrene cellulitis of first left toe. Patientwas placedon Vanco/Zosyn dosing per pharmacy. Recent hospitalization past week with volume overload and diuresed with over 20 pound.    Consultants: cardiology, orthopedic.   Procedures: Lower extremity angiography  Antimicrobials:  Vancomycin   ceftriaxone and metronidazole  Subjective: No new complaints today.  Feels his breathing is better today denies any shortness of breath, chest pain, or foot pain  Objective: Vitals:   11/28/18 0541 11/28/18 0922 11/28/18 2058 11/29/18 0516  BP:  (!) 182/74 (!) 158/64 (!) 153/64  Pulse:   68 65  Resp:   18 16  Temp:   98.2 F (36.8 C) 98.7 F (37.1 C)  TempSrc:   Oral Axillary  SpO2:   98% 96%  Weight: 100 kg   105.1 kg  Height:        Intake/Output Summary (Last 24 hours) at 11/29/2018 0758 Last data filed at 11/29/2018 0500 Gross per 24 hour  Intake 360 ml  Output 3200 ml  Net -2840 ml   Filed Weights   11/27/18 0343 11/28/18 0541 11/29/18 0516  Weight: 102.8 kg 100 kg 105.1 kg    Examination: General exam:Sitting in bed, nontoxic nontachypneic Respiratory system: Clear to auscultation. Respiratory effort normal. Cardiovascular system:S1 &S2 heard, RRR. Harsh 3/6 systolic ejection murmur no gallops Gastrointestinal system:Abdomen is nondistended, soft and nontenderNormal bowel sounds heard. Central nervous system:Alert and oriented. No focal neurological deficits. Extremities:Right growing pulses intact dressing intact  no bleeding.Right BKA, left toes black. Mild LLE edema. Anterior shin dressing in place. LLE cold to touch, gangrenous toes Skin:warm, dry Psychiatry:Mood &affect appropriatefor current setting  Data Reviewed: I have personally reviewed following labs and imaging studies  CBC: Recent Labs  Lab 11/23/18 0342 11/24/18 0328 11/25/18 0301 11/26/18 0501 11/27/18 0929  WBC 9.5 8.4 11.6* 11.3* 10.3  HGB 10.5* 10.3* 9.6* 8.6* 8.9*  HCT 32.0* 29.9* 29.1* 26.9* 27.0*  MCV 91.7 89.8 92.7 94.1 91.8  PLT 279 263 252 242 062   Basic Metabolic Panel: Recent Labs  Lab 11/23/18 0342 11/24/18 0328 11/25/18 0301 11/26/18 0501 11/27/18 0509  NA 134* 132* 135 137 138  K 4.0 3.5 4.0 3.8 4.2  CL 99 100 103 105 106  CO2 24 22 23 23 23   GLUCOSE 113* 251* 102* 182* 113*  BUN 24* 31* 36* 35* 28*  CREATININE 1.57* 1.43* 1.69* 1.67* 1.27*  CALCIUM 8.9 8.8* 8.6* 8.4* 8.4*   GFR: Estimated Creatinine Clearance: 47.9 mL/min (A) (by C-G formula based on SCr of 1.27 mg/dL (H)). Liver Function Tests: No results for input(s): AST, ALT, ALKPHOS, BILITOT, PROT, ALBUMIN in the last 168 hours. No results for input(s): LIPASE, AMYLASE in the last 168 hours. No results for input(s): AMMONIA in the last 168 hours. Coagulation Profile: No results for input(s): INR, PROTIME in the last 168 hours. Cardiac Enzymes: No results for input(s): CKTOTAL, CKMB, CKMBINDEX, TROPONINI in the last 168 hours. BNP (last 3 results) No results for input(s): PROBNP in the last 8760 hours. HbA1C: No results for input(s): HGBA1C in the last 72  hours. CBG: Recent Labs  Lab 11/27/18 0758 11/27/18 1127 11/27/18 1700 11/28/18 2104 11/29/18 0751  GLUCAP 113* 166* 137* 166* 125*   Lipid Profile: No results for input(s): CHOL, HDL, LDLCALC, TRIG, CHOLHDL, LDLDIRECT in the last 72 hours. Thyroid Function Tests: No results for input(s): TSH, T4TOTAL, FREET4, T3FREE, THYROIDAB in the last 72 hours. Anemia Panel: No  results for input(s): VITAMINB12, FOLATE, FERRITIN, TIBC, IRON, RETICCTPCT in the last 72 hours. Sepsis Labs: No results for input(s): PROCALCITON, LATICACIDVEN in the last 168 hours.  Recent Results (from the past 240 hour(s))  Surgical pcr screen     Status: None   Collection Time: 11/19/18  5:30 PM   Specimen: Nasal Mucosa; Nasal Swab  Result Value Ref Range Status   MRSA, PCR NEGATIVE NEGATIVE Final   Staphylococcus aureus NEGATIVE NEGATIVE Final    Comment: (NOTE) The Xpert SA Assay (FDA approved for NASAL specimens in patients 45 years of age and older), is one component of a comprehensive surveillance program. It is not intended to diagnose infection nor to guide or monitor treatment. Performed at [facility], [place] [address] [place], [place] [address]          Radiology Studies: No results found.      Scheduled Meds: . amLODipine  5 mg Oral Daily  . aspirin EC  81 mg Oral Daily  . atorvastatin  40 mg Oral Daily  . clopidogrel  75 mg Oral Q breakfast  . collagenase   Topical Daily  . dorzolamide-timolol  1 drop Both Eyes BID  . enoxaparin (LOVENOX) injection  40 mg Subcutaneous Daily  . famotidine  20 mg Oral Daily  . finasteride  5 mg Oral QPM  . furosemide  20 mg Oral Daily  . insulin aspart  0-15 Units Subcutaneous TID WC  . insulin aspart  0-5 Units Subcutaneous QHS  . levothyroxine  50 mcg Oral Q0600  . potassium chloride  10 mEq Oral Daily  . sodium chloride flush  10-40 mL Intracatheter Q12H  . sodium chloride flush  3 mL Intravenous Q12H  . sodium chloride flush  3 mL Intravenous Q12H  . sodium chloride flush  3 mL Intravenous Q12H  . tamsulosin  0.4 mg Oral BID   Continuous Infusions: . sodium chloride 250 mL (11/26/18 1031)  . sodium chloride    . cefTRIAXone (ROCEPHIN)  IV 2 g (11/28/18 1413)  . metronidazole 500 mg (11/29/18 0534)    Assessment & Plan:   Principal Problem:   Toe infection Active Problems:   Peripheral vascular  disease ([place])   Essential hypertension, benign   Hyperlipidemia   Aortic valve stenosis   Hypothyroidism   PAF (paroxysmal atrial fibrillation) (HCC)   Type 2 diabetes mellitus with foot ulcer, without long-term current use of insulin (HCC)   Critical limb ischemia with history of revascularization of same extremity   1.First left toe with dry gangrene/ischemic ulcer and cellulitis in the setting of PVD-with critical limb ischemialeft great IDP:[id number] withon  Blood culture negativex2 - -S/pSuccessful complex revascularization of the left TP trunk into the peroneal artery using 2 overlapping drug-eluting stents as well as drug coated balloon angioplasty of the distal left popliteal artery.    -Vanco was discontinued as patient is MRSA negative Continue with aspirin and Plavix. -Continue dressing care -Per Ortho plan for transmetatarsal amputation on Wednesday 11/18   2.Essential hypertension bp labile, elevated at times Continue with lasix Increase amlodipine to 10 mg daily  3.Hyperlipidemia:  On  Lipitor  4. Hypothyroidism: On levothyroxine 50 MCG.  5.CKD stage IIIb creatinine baseline 1-.5 -1.7. stable. No evidence of contrast-induced nephropathy as his creatinine has improved,  Lasix was resumed at lower dose 20mg  qd Monitor bmp in am    6.Moderate aortic stenosis- MG25 mm hg by echo 08/2018. Cards following  7.PAF: In NSR -On aspirin and DOAC whichheld and was on heparin gtt. -Per cardiology would not recommend favoring triple therapy and plan to stop aspirin when Eliquis is resumed.    8.Decrease h/h- Likely dilutional from IV hydration. No signs of bleeding.  -occult stoolstill pending  9.Type 2 diabetes mellitus with foot ulcer, without long-term current use of insulin- BGwell controlled Holding home glipizide.  ContinueFS check and RISS  10.BPH continue Flomax and finasteride.     DVT PPX:SCD Code  Status:DNR Family Communication:no one at bedside today Disposition Plan: Will need >2MN stays , surgery next week by ortho          LOS: 10 days   Time spent: 5 minutes with more than 50% COC    Nolberto Hanlon, MD [facility] Pager [phone]  If 7PM-7AM, please contact night-coverage [email or web address] Password [email or web address] 11/29/2018, 7:58 AM

## 2018-11-30 ENCOUNTER — Other Ambulatory Visit: Payer: Self-pay | Admitting: Physician Assistant

## 2018-11-30 DIAGNOSIS — I10 Essential (primary) hypertension: Secondary | ICD-10-CM

## 2018-11-30 DIAGNOSIS — I739 Peripheral vascular disease, unspecified: Secondary | ICD-10-CM

## 2018-11-30 LAB — GLUCOSE, CAPILLARY
Glucose-Capillary: 172 mg/dL — ABNORMAL HIGH (ref 70–99)
Glucose-Capillary: 155 mg/dL — ABNORMAL HIGH (ref 70–99)
Glucose-Capillary: 122 mg/dL — ABNORMAL HIGH (ref 70–99)
Glucose-Capillary: 145 mg/dL — ABNORMAL HIGH (ref 70–99)
Glucose-Capillary: 178 mg/dL — ABNORMAL HIGH (ref 70–99)
Glucose-Capillary: 132 mg/dL — ABNORMAL HIGH (ref 70–99)

## 2018-11-30 LAB — BASIC METABOLIC PANEL
Anion gap: 12 (ref 5–15)
BUN: 19 mg/dL (ref 8–23)
CO2: 22 mmol/L (ref 22–32)
Calcium: 8.5 mg/dL — ABNORMAL LOW (ref 8.9–10.3)
Chloride: 100 mmol/L (ref 98–111)
Creatinine, Ser: 1.25 mg/dL — ABNORMAL HIGH (ref 0.61–1.24)
GFR calc Af Amer: 59 mL/min — ABNORMAL LOW (ref >60)
GFR calc non Af Amer: 51 mL/min — ABNORMAL LOW (ref >60)
Glucose, Bld: 134 mg/dL — ABNORMAL HIGH (ref 70–99)
Potassium: 3.8 mmol/L (ref 3.5–5.1)
Sodium: 134 mmol/L — ABNORMAL LOW (ref 135–145)

## 2018-11-30 MED ORDER — CHLORHEXIDINE GLUCONATE 4 % EX LIQD
60.0000 mL | Freq: Once | CUTANEOUS | Status: AC
  Administered 2018-12-02: 4 via TOPICAL
  Filled 2018-11-30: qty 30

## 2018-11-30 MED ORDER — CEFAZOLIN SODIUM-DEXTROSE 2-4 GM/100ML-% IV SOLN: 2.0000 g | INTRAVENOUS | Status: DC

## 2018-11-30 NOTE — Progress Notes (Addendum)
Inpatient Rehabilitation Admissions Coordinator  Inpatient rehab consult received . I met with patient and a son at bedside for rehab assessment. I await pt's surgery on Wednesday and postoperative progress with therapy . I will then begin insurance authorization with [facility] Medicare for a possible CIR admit. Pt previously at [facility] 08/2018 after initial BKA.   Danne Baxter, RN, MSN Rehab Admissions Coordinator [phone] 11/30/2018 3:25 PM

## 2018-11-30 NOTE — Progress Notes (Signed)
PROGRESS NOTE    [name]  OKH:[id number] DOB: July 12, 1927 DOA: 11/18/2018 PCP: Redmond School, MD    Brief Narrative:  85 year old male with HTN, HLD, T2DM CKD stage III, PE AF, diastolic CHF, PAD status post right BKA admitted from home with black first toe on left foot. In the ER x-ray showed evidence of?osteomyelitisbut reviewing final xr, no osteomyelitis was detectedand was admitted 11/4 for further management of of dry gangrene cellulitis of first left toe. Patientwas placedon Vanco/Zosyn dosing per pharmacy. Recent hospitalization past week with volume overload and diuresed with over 20 pound.    Consultants: cardiology, orthopedic.   Procedures: Lower extremity angiography  Antimicrobials:  Vancomycin   ceftriaxone and metronidazole    Subjective: Patient has no complaints.  Reports shortness of breath better.  No fever, chills, abdominal pain or any other complaints  Objective: Vitals:   11/29/18 0516 11/29/18 1505 11/29/18 1938 11/30/18 0437  BP: (!) 153/64 (!) 151/64 (!) 142/60 (!) 142/68  Pulse: 65  64 67  Resp: 16  18 15   Temp: 98.7 F (37.1 C)  98.4 F (36.9 C) 98.2 F (36.8 C)  TempSrc: Axillary  Oral Oral  SpO2: 96%  95% 99%  Weight: 105.1 kg   94.8 kg  Height:        Intake/Output Summary (Last 24 hours) at 11/30/2018 0803 Last data filed at 11/30/2018 0600 Gross per 24 hour  Intake 719.71 ml  Output 2350 ml  Net -1630.29 ml   Filed Weights   11/28/18 0541 11/29/18 0516 11/30/18 0437  Weight: 100 kg 105.1 kg 94.8 kg    Examination: General exam:Appears relaxed, NAD nontoxic nontachypneic Respiratory system: Clear to auscultation. Respiratory effort normal.  No crackles, wheezing or rhonchi Cardiovascular system:S1 &S2 heard, RRR. Harsh 3/6 systolic ejection murmur no gallops Gastrointestinal system:Abdomen is nondistended, soft and nontenderNormal bowel sounds heard.  No rebound or guarding Central nervous  system:Alert and oriented x3. No focal neurological deficits. Extremities:Right BKA, left toes black.  No LLE edema. Anterior shin dressing in place. LLE cold to touch, gangrenous toes,bleeding minimally from site. Skin:warm, dry Psychiatry:Mood &affect appropriate    Data Reviewed: I have personally reviewed following labs and imaging studies  CBC: Recent Labs  Lab 11/24/18 0328 11/25/18 0301 11/26/18 0501 11/27/18 0929  WBC 8.4 11.6* 11.3* 10.3  HGB 10.3* 9.6* 8.6* 8.9*  HCT 29.9* 29.1* 26.9* 27.0*  MCV 89.8 92.7 94.1 91.8  PLT 263 252 242 953   Basic Metabolic Panel: Recent Labs  Lab 11/24/18 0328 11/25/18 0301 11/26/18 0501 11/27/18 0509 11/30/18 0649  NA 132* 135 137 138 134*  K 3.5 4.0 3.8 4.2 3.8  CL 100 103 105 106 100  CO2 22 23 23 23 22   GLUCOSE 251* 102* 182* 113* 134*  BUN 31* 36* 35* 28* 19  CREATININE 1.43* 1.69* 1.67* 1.27* 1.25*  CALCIUM 8.8* 8.6* 8.4* 8.4* 8.5*   GFR: Estimated Creatinine Clearance: 46.3 mL/min (A) (by C-G formula based on SCr of 1.25 mg/dL (H)). Liver Function Tests: No results for input(s): AST, ALT, ALKPHOS, BILITOT, PROT, ALBUMIN in the last 168 hours. No results for input(s): LIPASE, AMYLASE in the last 168 hours. No results for input(s): AMMONIA in the last 168 hours. Coagulation Profile: No results for input(s): INR, PROTIME in the last 168 hours. Cardiac Enzymes: No results for input(s): CKTOTAL, CKMB, CKMBINDEX, TROPONINI in the last 168 hours. BNP (last 3 results) No results for input(s): PROBNP in the last 8760 hours. HbA1C: No results  for input(s): HGBA1C in the last 72 hours. CBG: Recent Labs  Lab 11/27/18 1700 11/28/18 2104 11/29/18 0751 11/29/18 1144 11/30/18 0755  GLUCAP 137* 166* 125* 133* 122*   Lipid Profile: No results for input(s): CHOL, HDL, LDLCALC, TRIG, CHOLHDL, LDLDIRECT in the last 72 hours. Thyroid Function Tests: No results for input(s): TSH, T4TOTAL, FREET4, T3FREE, THYROIDAB in  the last 72 hours. Anemia Panel: No results for input(s): VITAMINB12, FOLATE, FERRITIN, TIBC, IRON, RETICCTPCT in the last 72 hours. Sepsis Labs: No results for input(s): PROCALCITON, LATICACIDVEN in the last 168 hours.  No results found for this or any previous visit (from the past 240 hour(s)).       Radiology Studies: No results found.      Scheduled Meds:  amLODipine  10 mg Oral Daily   aspirin EC  81 mg Oral Daily   atorvastatin  40 mg Oral Daily   clopidogrel  75 mg Oral Q breakfast   collagenase   Topical Daily   dorzolamide-timolol  1 drop Both Eyes BID   enoxaparin (LOVENOX) injection  40 mg Subcutaneous Daily   famotidine  20 mg Oral Daily   finasteride  5 mg Oral QPM   furosemide  20 mg Oral Daily   insulin aspart  0-15 Units Subcutaneous TID WC   insulin aspart  0-5 Units Subcutaneous QHS   levothyroxine  50 mcg Oral Q0600   potassium chloride  10 mEq Oral Daily   sodium chloride flush  10-40 mL Intracatheter Q12H   sodium chloride flush  3 mL Intravenous Q12H   sodium chloride flush  3 mL Intravenous Q12H   sodium chloride flush  3 mL Intravenous Q12H   tamsulosin  0.4 mg Oral BID   Continuous Infusions:  sodium chloride 250 mL (11/26/18 1031)   sodium chloride     cefTRIAXone (ROCEPHIN)  IV Stopped (11/30/18 0252)   metronidazole 500 mg (11/30/18 0555)    Assessment & Plan:   Principal Problem:   Toe infection Active Problems:   Peripheral vascular disease ([place])   Essential hypertension, benign   Hyperlipidemia   Aortic valve stenosis   Hypothyroidism   PAF (paroxysmal atrial fibrillation) ([place])   Type 2 diabetes mellitus with foot ulcer, without long-term current use of insulin (H[facility])   Critical limb ischemia with history of revascularization of same extremity   1.First left toe with dry gangrene/ischemic ulcer and cellulitis in the setting of PVD-with critical limb ischemialeft great SAY:[id number] withon  Blood  culture negativex2 - -S/pSuccessful complex revascularization of the left TP trunk into the peroneal artery using 2 overlapping drug-eluting stents as well as drug coated balloon angioplasty of the distal left popliteal artery. -Vanco was discontinued as patient is MRSA negative Continue with aspirin and Plavix. -Continue dressing care -Per Ortho plan fortransmetatarsal amputation on Wednesday 11/18   2.Essential hypertension Better controlled Continue with lasix and amlodipine   3.Hyperlipidemia: On  Lipitor  4. Hypothyroidism: On levothyroxine 50 MCG.  5.CKD stage IIIb creatinine baseline 1-.5 -1.7. stable. No evidence of contrast-induced nephropathy as his creatinine has improved, Lasix was resumed at lower dose 20mg  qd, creatinine improved Continue to monitor    6.Moderate aortic stenosis- MG25 mm hg by echo 08/2018. Cards following   7.PAF: In NSR -On aspirin and DOAC whichheld and was on heparin gtt. -Per cardiology would not recommend favoring triple therapy and plan to stop aspirin when Eliquis is resumed. Plavix resumed now   8.Decrease h/h- Likely dilutional from IV hydration. No  signs of bleeding. H/h stablized Stool occult not completed.   9.Type 2 diabetes mellitus with foot ulcer, without long-term current use of insulin- BGwell controlled Holding home glipizide.  ContinueFS check and RISS  10.BPH continue Flomax and finasteride.     DVT PPX:SCD Code Status:DNR Family Communication:no one at bedside today Disposition Plan: Will need >2MN stays , surgery on Wednesday by Dr. Sharol Given orthopedics       LOS: 11 days   Time spent: 45 minutes with more than 50% COC    Nolberto Hanlon, MD [facility] Pager [phone]  If 7PM-7AM, please contact night-coverage [email or web address] Password [email or web address] 11/30/2018, 8:03 AM

## 2018-11-30 NOTE — Progress Notes (Signed)
Awake alert. Gangrene 1st and second toes left foot with ischemic changes 5th met head. No cellulitis , no signs of infection. Transmet as previous discussed by Dr. Sharol Given planned for [facility]

## 2018-11-30 NOTE — Care Management Important Message (Signed)
Important Message  Patient Details  Name: [name] MRN: [id number] Date of Birth: September 10, 1927   Medicare Important Message Given:  Yes     Shelda Altes 11/30/2018, 2:26 PM

## 2018-11-30 NOTE — Progress Notes (Addendum)
Progress Note  Patient Name: [name] Date of Encounter: 11/30/2018  Primary Cardiologist: Pixie Casino, MD   Subjective   [name] reports that he is having some foot pain today, he denies any chest pain, shortness of breath, or other symptoms.   Inpatient Medications    Scheduled Meds: . amLODipine  10 mg Oral Daily  . aspirin EC  81 mg Oral Daily  . atorvastatin  40 mg Oral Daily  . clopidogrel  75 mg Oral Q breakfast  . collagenase   Topical Daily  . dorzolamide-timolol  1 drop Both Eyes BID  . enoxaparin (LOVENOX) injection  40 mg Subcutaneous Daily  . famotidine  20 mg Oral Daily  . finasteride  5 mg Oral QPM  . furosemide  20 mg Oral Daily  . insulin aspart  0-15 Units Subcutaneous TID WC  . insulin aspart  0-5 Units Subcutaneous QHS  . levothyroxine  50 mcg Oral Q0600  . potassium chloride  10 mEq Oral Daily  . sodium chloride flush  10-40 mL Intracatheter Q12H  . sodium chloride flush  3 mL Intravenous Q12H  . sodium chloride flush  3 mL Intravenous Q12H  . sodium chloride flush  3 mL Intravenous Q12H  . tamsulosin  0.4 mg Oral BID   Continuous Infusions: . sodium chloride 250 mL (11/26/18 1031)  . sodium chloride    . cefTRIAXone (ROCEPHIN)  IV Stopped (11/30/18 0252)  . metronidazole 500 mg (11/30/18 0555)   PRN Meds: sodium chloride, sodium chloride, acetaminophen, morphine injection, ondansetron (ZOFRAN) IV, sodium chloride flush, sodium chloride flush, sodium chloride flush   Vital Signs    Vitals:   11/29/18 1505 11/29/18 1938 11/30/18 0437 11/30/18 0837  BP: (!) 151/64 (!) 142/60 (!) 142/68 (!) 147/63  Pulse:  64 67 65  Resp:  18 15 16   Temp:  98.4 F (36.9 C) 98.2 F (36.8 C)   TempSrc:  Oral Oral   SpO2:  95% 99% 97%  Weight:   94.8 kg   Height:        Intake/Output Summary (Last 24 hours) at 11/30/2018 0921 Last data filed at 11/30/2018 0837 Gross per 24 hour  Intake 959.71 ml  Output 2350 ml  Net -1390.29 ml   Filed  Weights   11/28/18 0541 11/29/18 0516 11/30/18 0437  Weight: 100 kg 105.1 kg 94.8 kg    Telemetry    NSR, Hr 70s - Personally Reviewed  ECG    11/23/18: NSR, 1st degree block, RBBB - Personally Reviewed  Physical Exam   GEN: No acute distress. Elderly male.  Neck: No JVD Cardiac: RRR, 3/6 systolic murmur, no rubs, or gallops.  Respiratory: Clear to auscultation bilaterally. GI: Soft, nontender, non-distended  MS: No edema; 1st and 2nd left digits dark, small amount of drainage Neuro:  Nonfocal  Psych: Normal affect   Labs    Chemistry Recent Labs  Lab 11/26/18 0501 11/27/18 0509 11/30/18 0649  NA 137 138 134*  K 3.8 4.2 3.8  CL 105 106 100  CO2 23 23 22   GLUCOSE 182* 113* 134*  BUN 35* 28* 19  CREATININE 1.67* 1.27* 1.25*  CALCIUM 8.4* 8.4* 8.5*  GFRNONAA 36* 50* 51*  GFRAA 41* 58* 59*  ANIONGAP 9 9 12      Hematology Recent Labs  Lab 11/25/18 0301 11/26/18 0501 11/27/18 0929  WBC 11.6* 11.3* 10.3  RBC 3.14* 2.86* 2.94*  HGB 9.6* 8.6* 8.9*  HCT 29.1* 26.9* 27.0*  MCV 92.7 94.1 91.8  MCH 30.6 30.1 30.3  MCHC 33.0 32.0 33.0  RDW 14.6 14.6 15.0  PLT 252 242 233    Cardiac EnzymesNo results for input(s): TROPONINI in the last 168 hours. No results for input(s): TROPIPOC in the last 168 hours.   BNPNo results for input(s): BNP, PROBNP in the last 168 hours.   DDimer No results for input(s): DDIMER in the last 168 hours.   Radiology    No results Johnston.  Cardiac Studies   PV angiogram: 11/23/18  Procedures Performed: 1. Ultrasound-guided right common femoral access/contralateral access 2. Placement of an NAV6 distal protection device below the knee left popliteal artery 3. Diamondback orbital rotational atherectomy distal left SFA and popliteal artery 4. DCB distal left SFA/popliteal artery  Final Impression:Successful diamondback orbital rotational atherectomy, DCB using distal  protection of a highly calcified subtotally occluded distal left SFA and popliteal artery in the setting of critical limb ischemia. The dominant vessel below the knee is a peroneal. There does seem to be a subtotally occluded tibioperoneal trunk which could be percutaneously revascularizable. Because had use 160 cc did die and his creatinine clearance was in the 35 mL/h range I decided to terminate the procedure. He will need to be gently hydrated overnight and treated with antiplatelet therapy. He may ultimately really need a staged peroneal intervention.  [name]. MD, [name]  PV angiogram: 11/25/18  Successful complex revascularization of the left TP trunk into the peroneal artery using 2 overlapping drug-eluting stents as well as drug coated balloon angioplasty of the distal left popliteal artery. The occlusion was crossed retrograde via the peroneal artery.  Recommendations: Continue dual antiplatelet therapy. Hydrate overnight as he is high risk for contrast-induced nephropathy. The patient received excessive amount of radiation and will be counseled.  Patient Profile     85 y.o. male with a history of HTN, HLD, PAF, OSA, CKD III, DM, and PVD s/p stent to Lt SFA 05/2010 after infection of right great toe and eventual BKA in 08/2018 who presented 11/18/2018 with worsening ischemic ulcer to left great to with cellulitis.   Assessment & Plan    1. PAF: Currently in NSR, rate controlled. Was on eliquis at home. Holding his eliquis and heprain drip since 11/12. Orthopedics planning amputation on Wednesday. Will continue to hold for now.  -Continue telemetry  2. Critical limb ischemia of left great toe: S/p PV angiogram with successful arthrectomy to occluded left SFA and popliteal artery 11/23/18 with Dr. Gwenlyn Johnston. Staged intervention with Dr. Sophronia Johnston 11/26/18. Is currently on DAPT with ASA and plavix, can stop ASA when eliquis is resumed. Planned for transmetatasal amputation 12/02/18.   -Continue ASA and plavix -Discontinue ASA when plavix resumed   3. CKD stage III: Cr today 1.25, stable.  4. Moderate AS: Murmur noted on exam, echocardiogram 08/2018 showed mean gradient of 25 mmHg.   5. HTN: BP ranged 142/60-153/64. Currently on amlodipine 10 mg daily.   6. Anemia: Hgb today 8.9, down from 10.3 on admission. No active bleeding noted. Continue to monitor.   For questions or updates, please contact [place] Please consult [email or web address] for contact info under Cardiology/STEMI.    Signed, Asencion Noble, MD  11/30/2018, 9:21 AM    Agree with note with Dr. Lonia Skinner  Patient well-known to me.  History of moderate AS, hypertension, CKD 3 and critical limb ischemia.  He status post right BKA this past summer after complex staged intervention of his right SFA and anterior  tibial.  I intervened on him 11/23/2022 CLI of left first and second toes.  I performed orbital atherectomy of highly calcified distal left SFA and popliteal artery followed by staged intervention by Dr. Fletcher Anon 2 days later of the tibioperoneal trunk.  He placed 2 drug-eluting stents.  He got inline flow down to the ankle.  He scheduled for left TMA by Dr Sharol Given on Wednesday.  His renal function is stable.  Lorretta Harp, M.D., [place], [facility], [place] [address] [place], [place]  [address]  [phone] 11/30/2018 11:20 AM .

## 2018-12-01 DIAGNOSIS — E782 Mixed hyperlipidemia: Secondary | ICD-10-CM

## 2018-12-01 LAB — GLUCOSE, CAPILLARY
Glucose-Capillary: 166 mg/dL — ABNORMAL HIGH (ref 70–99)
Glucose-Capillary: 166 mg/dL — ABNORMAL HIGH (ref 70–99)
Glucose-Capillary: 166 mg/dL — ABNORMAL HIGH (ref 70–99)
Glucose-Capillary: 144 mg/dL — ABNORMAL HIGH (ref 70–99)
Glucose-Capillary: 118 mg/dL — ABNORMAL HIGH (ref 70–99)
Glucose-Capillary: 168 mg/dL — ABNORMAL HIGH (ref 70–99)
Glucose-Capillary: 254 mg/dL — ABNORMAL HIGH (ref 70–99)
Glucose-Capillary: 133 mg/dL — ABNORMAL HIGH (ref 70–99)
Glucose-Capillary: 162 mg/dL — ABNORMAL HIGH (ref 70–99)
Glucose-Capillary: 154 mg/dL — ABNORMAL HIGH (ref 70–99)
Glucose-Capillary: 147 mg/dL — ABNORMAL HIGH (ref 70–99)

## 2018-12-01 NOTE — Progress Notes (Addendum)
Progress Note  Patient Name: [name] Date of Encounter: 12/01/2018  Primary Cardiologist: Pixie Casino, MD   Subjective   [name] reports that he was feeling okay today, denies any chest pain or shortness of breath. He reports some pain in his foot and his heel.   Inpatient Medications    Scheduled Meds: . amLODipine  10 mg Oral Daily  . aspirin EC  81 mg Oral Daily  . atorvastatin  40 mg Oral Daily  . [START ON 12/02/2018] chlorhexidine  60 mL Topical Once  . clopidogrel  75 mg Oral Q breakfast  . collagenase   Topical Daily  . dorzolamide-timolol  1 drop Both Eyes BID  . enoxaparin (LOVENOX) injection  40 mg Subcutaneous Daily  . famotidine  20 mg Oral Daily  . finasteride  5 mg Oral QPM  . furosemide  20 mg Oral Daily  . insulin aspart  0-15 Units Subcutaneous TID WC  . insulin aspart  0-5 Units Subcutaneous QHS  . levothyroxine  50 mcg Oral Q0600  . potassium chloride  10 mEq Oral Daily  . sodium chloride flush  10-40 mL Intracatheter Q12H  . sodium chloride flush  3 mL Intravenous Q12H  . sodium chloride flush  3 mL Intravenous Q12H  . sodium chloride flush  3 mL Intravenous Q12H  . tamsulosin  0.4 mg Oral BID   Continuous Infusions: . sodium chloride 250 mL (11/26/18 1031)  . sodium chloride    .  ceFAZolin (ANCEF) IV    . cefTRIAXone (ROCEPHIN)  IV 2 g (11/30/18 1437)  . metronidazole 500 mg (12/01/18 0546)   PRN Meds: sodium chloride, sodium chloride, acetaminophen, morphine injection, ondansetron (ZOFRAN) IV, sodium chloride flush, sodium chloride flush, sodium chloride flush   Vital Signs    Vitals:   11/30/18 0837 11/30/18 1120 11/30/18 1559 11/30/18 2010  BP: (!) 147/63 (!) 153/69 (!) 145/58 (!) 145/63  Pulse: 65 73 65 64  Resp: 16 16 14 14   Temp:   (!) 97.4 F (36.3 C) 97.9 F (36.6 C)  TempSrc:  Oral Oral Oral  SpO2: 97% 99% 98% 98%  Weight:      Height:        Intake/Output Summary (Last 24 hours) at 12/01/2018 0611 Last data  filed at 11/30/2018 1300 Gross per 24 hour  Intake 240 ml  Output 300 ml  Net -60 ml   Filed Weights   11/28/18 0541 11/29/18 0516 11/30/18 0437  Weight: 100 kg 105.1 kg 94.8 kg    Telemetry    NSR, 1st degree AV block - Personally Reviewed  ECG    None today  Physical Exam   GEN: No acute distress. Elderly male.  Neck: No JVD Cardiac: RRR, systolic murmur, no rubs, or gallops.  Respiratory: Clear to auscultation bilaterally. GI: Soft, nontender, non-distended  MS: No edema; right BKA, left 1st and 2nd digit black Neuro:  Nonfocal  Psych: Normal affect   Labs    Chemistry Recent Labs  Lab 11/26/18 0501 11/27/18 0509 11/30/18 0649  NA 137 138 134*  K 3.8 4.2 3.8  CL 105 106 100  CO2 23 23 22   GLUCOSE 182* 113* 134*  BUN 35* 28* 19  CREATININE 1.67* 1.27* 1.25*  CALCIUM 8.4* 8.4* 8.5*  GFRNONAA 36* 50* 51*  GFRAA 41* 58* 59*  ANIONGAP 9 9 12      Hematology Recent Labs  Lab 11/25/18 0301 11/26/18 0501 11/27/18 0929  WBC 11.6* 11.3*  10.3  RBC 3.14* 2.86* 2.94*  HGB 9.6* 8.6* 8.9*  HCT 29.1* 26.9* 27.0*  MCV 92.7 94.1 91.8  MCH 30.6 30.1 30.3  MCHC 33.0 32.0 33.0  RDW 14.6 14.6 15.0  PLT 252 242 233    Cardiac EnzymesNo results for input(s): TROPONINI in the last 168 hours. No results for input(s): TROPIPOC in the last 168 hours.   BNPNo results for input(s): BNP, PROBNP in the last 168 hours.   DDimer No results for input(s): DDIMER in the last 168 hours.   Radiology    No results Johnston.  Cardiac Studies   PV angiogram: 11/23/18  Procedures Performed: 1. Ultrasound-guided right common femoral access/contralateral access 2. Placement of an NAV6 distal protection device below the knee left popliteal artery 3. Diamondback orbital rotational atherectomy distal left SFA and popliteal artery 4. DCB distal left SFA/popliteal artery  Final Impression:Successful diamondback orbital  rotational atherectomy, DCB using distal protection of a highly calcified subtotally occluded distal left SFA and popliteal artery in the setting of critical limb ischemia. The dominant vessel below the knee is a peroneal. There does seem to be a subtotally occluded tibioperoneal trunk which could be percutaneously revascularizable. Because had use 160 cc did die and his creatinine clearance was in the 35 mL/h range I decided to terminate the procedure. He will need to be gently hydrated overnight and treated with antiplatelet therapy. He may ultimately really need a staged peroneal intervention.  [name]. MD, [facility]  PV angiogram: 11/25/18  Successful complex revascularization of the left TP trunk into the peroneal artery using 2 overlapping drug-eluting stents as well as drug coated balloon angioplasty of the distal left popliteal artery. The occlusion was crossed retrograde via the peroneal artery.  Recommendations: Continue dual antiplatelet therapy. Hydrate overnight as he is high risk for contrast-induced nephropathy. The patient received excessive amount of radiation and will be counseled.  Patient Profile     85 y.o. male with a history of HTN, HLD, PAF, OSA, CKD III, DM, and PVD s/p stent to Lt SFA 05/2010 after infection of right great toe and eventual BKA in 08/2018 who presented 11/18/2018 with worsening ischemic ulcer to left great to with cellulitis.   Assessment & Plan    1. PAF: Currently in NSR, rate controlled. Was on eliquis at home. Holding his eliquis and heprain drip since 11/12. Orthopedics planning amputation on Wednesday. Will continue to hold for now.  -Continue telemetry -Will need to resume eliquis following procedure  2. Critical limb ischemia of left great toe: S/p PV angiogram with successful arthrectomy to occluded left SFA and popliteal artery 11/23/18 with Dr. Gwenlyn Johnston. Staged intervention with Dr. Sophronia Johnston 11/26/18. Is currently on DAPT with ASA and  plavix, can stop ASA when eliquis is resumed. Planned for transmetatarsal amputation 12/02/18.  -Continue ASA and plavix -Discontinue ASA when eliquis resumed   3. CKD stage III: Cr has been stable, last one was 1.25.  4. Moderate AS: Murmur noted on exam, echocardiogram 08/2018 showed mean gradient of 25 mmHg.   5. HTN: BP ranged 145/58-153/69. Currently on amlodipine 10 mg daily.   6. Anemia: Hgb yesterday 8.9, down from 10.3 on admission. No active bleeding noted. Continue to monitor.   For questions or updates, please contact [place] Please consult [email or web address] for contact info under Cardiology/STEMI.   Signed, Asencion Noble, MD  12/01/2018, 6:11 AM    Agree with note by Dr. Lonia Skinner.  Patient well-known to me with problems as outlined  above.  He has critical limb ischemia status post complex stage left SFA popliteal and tibioperoneal trunk intervention for critical limb ischemia and limb salvage.  He has a gangrenous left first and second toe and is scheduled for left TMA by Dr. Sharol Given tomorrow.  Renal function stable.  Exam is benign and unchanged.  We will continue to follow him.  Lorretta Harp, M.D., [place], [facility], [place] [address] [place], [place]  [address]  [phone] 12/01/2018 11:03 AM

## 2018-12-01 NOTE — Progress Notes (Addendum)
PROGRESS NOTE    [name]  HQP:[id number] DOB: 1927-06-24 DOA: 11/18/2018 PCP: Redmond School, MD    Brief Narrative:  85 year old male with HTN, HLD, T2DM CKD stage III, PE AF, diastolic CHF, PAD status post right BKA admitted from home with black first toe on left foot. In the ER x-ray showed evidence of?osteomyelitisbut reviewing final xr, no osteomyelitis was detectedand was admitted 11/4 for further management of of dry gangrene cellulitis of first left toe. Patientwas placedon Vanco/Zosyn dosing per pharmacy. Recent hospitalization past week with volume overload and diuresed with over 20 pound.    Consultants: cardiology, orthopedic.   Procedures: Lower extremity angiography  Antimicrobials:  Vancomycin ..>d/c'd  Metronidazole...>d/c'd ceftriaxone ..>d/c today   Subjective: Denies any chest pain, shortness of breath, fever or chills.  Pain control in his lower extremity.  Objective: Vitals:   11/30/18 1559 11/30/18 2010 12/01/18 0500 12/01/18 0821  BP: (!) 145/58 (!) 145/63 (!) 151/65 (!) 146/64  Pulse: 65 64 63 64  Resp: 14 14 14 16   Temp: (!) 97.4 F (36.3 C) 97.9 F (36.6 C) 97.8 F (36.6 C) 97.7 F (36.5 C)  TempSrc: Oral Oral Oral Oral  SpO2: 98% 98% 97% 98%  Weight:   94.2 kg   Height:        Intake/Output Summary (Last 24 hours) at 12/01/2018 0824 Last data filed at 12/01/2018 0500 Gross per 24 hour  Intake 240 ml  Output 1250 ml  Net -1010 ml   Filed Weights   11/29/18 0516 11/30/18 0437 12/01/18 0500  Weight: 105.1 kg 94.8 kg 94.2 kg    Examination: General exam:Appears relaxed, NADnontoxic nontachypneic Respiratory system: CTA no wheeze rales rhonchi's  Cardiovascular system:Regular S1 &S2 heard,. Harsh 3/6 systolic ejection murmur no gallops Gastrointestinal system:Abdomen is nondistended, soft and nontender Normal bowel sounds heard.  No rebound or guarding Central nervous system:Alert and oriented x3.  No focal neurological deficits. Extremities:Right BKA, left toes black.  No LLE edema.  Anterior shin dressing in place. LLE cold to touch, no edema, gangrenous toes, no bleed Skin:warm, dry Psychiatry:Mood &affect appropriate    Data Reviewed: I have personally reviewed following labs and imaging studies  CBC: Recent Labs  Lab 11/25/18 0301 11/26/18 0501 11/27/18 0929  WBC 11.6* 11.3* 10.3  HGB 9.6* 8.6* 8.9*  HCT 29.1* 26.9* 27.0*  MCV 92.7 94.1 91.8  PLT 252 242 599   Basic Metabolic Panel: Recent Labs  Lab 11/25/18 0301 11/26/18 0501 11/27/18 0509 11/30/18 0649  NA 135 137 138 134*  K 4.0 3.8 4.2 3.8  CL 103 105 106 100  CO2 23 23 23 22   GLUCOSE 102* 182* 113* 134*  BUN 36* 35* 28* 19  CREATININE 1.69* 1.67* 1.27* 1.25*  CALCIUM 8.6* 8.4* 8.4* 8.5*   GFR: Estimated Creatinine Clearance: 46.2 mL/min (A) (by C-G formula based on SCr of 1.25 mg/dL (H)). Liver Function Tests: No results for input(s): AST, ALT, ALKPHOS, BILITOT, PROT, ALBUMIN in the last 168 hours. No results for input(s): LIPASE, AMYLASE in the last 168 hours. No results for input(s): AMMONIA in the last 168 hours. Coagulation Profile: No results for input(s): INR, PROTIME in the last 168 hours. Cardiac Enzymes: No results for input(s): CKTOTAL, CKMB, CKMBINDEX, TROPONINI in the last 168 hours. BNP (last 3 results) No results for input(s): PROBNP in the last 8760 hours. HbA1C: No results for input(s): HGBA1C in the last 72 hours. CBG: Recent Labs  Lab 11/30/18 0755 11/30/18 1120 11/30/18 1556 11/30/18 2200  12/01/18 0813  GLUCAP 122* 145* 178* 132* 133*   Lipid Profile: No results for input(s): CHOL, HDL, LDLCALC, TRIG, CHOLHDL, LDLDIRECT in the last 72 hours. Thyroid Function Tests: No results for input(s): TSH, T4TOTAL, FREET4, T3FREE, THYROIDAB in the last 72 hours. Anemia Panel: No results for input(s): VITAMINB12, FOLATE, FERRITIN, TIBC, IRON, RETICCTPCT in the last 72  hours. Sepsis Labs: No results for input(s): PROCALCITON, LATICACIDVEN in the last 168 hours.  No results found for this or any previous visit (from the past 240 hour(s)).       Radiology Studies: No results found.      Scheduled Meds:  amLODipine  10 mg Oral Daily   aspirin EC  81 mg Oral Daily   atorvastatin  40 mg Oral Daily   [START ON 12/02/2018] chlorhexidine  60 mL Topical Once   clopidogrel  75 mg Oral Q breakfast   collagenase   Topical Daily   dorzolamide-timolol  1 drop Both Eyes BID   enoxaparin (LOVENOX) injection  40 mg Subcutaneous Daily   famotidine  20 mg Oral Daily   finasteride  5 mg Oral QPM   furosemide  20 mg Oral Daily   insulin aspart  0-15 Units Subcutaneous TID WC   insulin aspart  0-5 Units Subcutaneous QHS   levothyroxine  50 mcg Oral Q0600   potassium chloride  10 mEq Oral Daily   sodium chloride flush  10-40 mL Intracatheter Q12H   sodium chloride flush  3 mL Intravenous Q12H   sodium chloride flush  3 mL Intravenous Q12H   sodium chloride flush  3 mL Intravenous Q12H   tamsulosin  0.4 mg Oral BID   Continuous Infusions:  sodium chloride 250 mL (11/26/18 1031)   sodium chloride      ceFAZolin (ANCEF) IV     cefTRIAXone (ROCEPHIN)  IV 2 g (11/30/18 1437)   metronidazole 500 mg (12/01/18 0546)    Assessment & Plan:   Principal Problem:   Toe infection Active Problems:   Peripheral vascular disease ([place])   Essential hypertension, benign   Hyperlipidemia   Aortic valve stenosis   Hypothyroidism   PAF (paroxysmal atrial fibrillation) ([place])   Type 2 diabetes mellitus with foot ulcer, without long-term current use of insulin (HCC)   Critical limb ischemia with history of revascularization of same extremity   1.First left toe with dry gangrene/ischemic ulcer and cellulitis in the setting of PVD-with critical limb ischemialeft great ION:[id number] withon  Blood culture negativex2 - -S/pSuccessful  complex revascularization of the left TP trunk into the peroneal artery using 2 overlapping drug-eluting stents as well as drug coated balloon angioplasty of the distal left popliteal artery. -Vanco was discontinued as patient is MRSA negative Continue with aspirin and Plavix.d/c asa when Eliquis is resumed per cards -Continue dressing care -Per Ortho plan fortransmetatarsal amputation on Wednesday11/18 Last dose of abx today.   2.Essential hypertension Better controlled Continue with lasix and amlodipine   3.Hyperlipidemia: OnLipitor  4.Hypothyroidism: Onlevothyroxine 50 MCG.  5.CKD stage IIIb creatinine baseline 1-.5 -1.7. stable. No evidence of contrast-induced nephropathy as his creatinine has improved, Lasixwas resumed at lower dose 20mg  qd, creatinine improved Continue to monitor    6.Moderate aortic stenosis- MG25 mm hg by echo 08/2018. Cards following   7.PAF: In NSR -On aspirin and DOAC whichheld and was on heparin gtt. -Per cardiology would not recommend favoring triple therapy and plan to stop aspirin when Eliquis is resumed.   8.Decrease h/h- Likely dilutional from IV  hydration. No signs of bleeding. H/h stablized Stool occult not completed.   9.Type 2 diabetes mellitus with foot ulcer, without long-term current use of insulin- BGwell controlled Holding home glipizide.  ContinueFS check and RISS  10.BPH continue Flomax and finasteride.     DVT PPX:SCD Code Status:DNR Family Communication:no one at bedside today Disposition Plan: Will need >2MN stays , surgery on Wednesday by Dr. Sharol Given orthopedics       LOS: 12 days   Time spent: 45% with more than 50% on Crystal, MD [facility] Pager [phone]  If 7PM-7AM, please contact night-coverage [email or web address] Password [facility] 12/01/2018, 8:24 AM

## 2018-12-01 NOTE — Progress Notes (Signed)
Physical Therapy Treatment Patient Details Name: [name] MRN: [id number] DOB: [birth year] Today's Date: 12/01/2018    History of Present Illness 85 y.o. male was admitted with L foot toe infection after a recent amputation BK on RLE in Aug 2020.  Pt's son reports a compression garment apparently distressed the circulation on LLE, resulting in skin breakdown.  Cleared for Covid, has no osteomyelitis on L toes or foot.  Recieved on 11/9 US guided R common fem access with placemetn of NAV6 distal protection device L popliteal artery, atherectomy distal L SFA and popliteal artery;  11/11 had PV angiogram with successful revascularization of L TP trunk into peroneal artery and balloon angioplasty distal L popliteal artery.  PMHx:  HTN, PAF, OSA, DM, CKD 3, PVD, R BK amputation, LLE cellulitis forefoot    PT Comments    +2 max to total assist for bed mobility, pt sat at edge of bed x 5 minutes, then was assisted back to supine for placement on bedpan as he needed to have a BM. Noted pt is scheduled for L transmetatarsal amputation tomorrow. Will follow post-operatively.  Follow Up Recommendations  CIR     Equipment Recommendations  None recommended by PT    Recommendations for Other Services Rehab consult     Precautions / Restrictions Precautions Precautions: Fall Precaution Comments: cellulitis with gangrene L toes Required Braces or Orthoses: Other Brace Other Brace: cast shoe to protect L foot Restrictions Weight Bearing Restrictions: No Other Position/Activity Restrictions: L BK amp is healed    Mobility  Bed Mobility Overal bed mobility: Needs Assistance Bed Mobility: Supine to Sit;Sit to Supine;Rolling Rolling: +2 for physical assistance;Max assist   Supine to sit: Max assist;HOB elevated Sit to supine: Total assist;+2 for physical assistance   General bed mobility comments: assist to raise trunk  Transfers                 General transfer comment:  attempted, but unable to perform lateral scoot towards head of bed  Ambulation/Gait             General Gait Details: not ambulatory yet since amp of R LE   Stairs             Wheelchair Mobility    Modified Rankin (Stroke Patients Only)       Balance                                            Cognition Arousal/Alertness: Awake/alert Behavior During Therapy: WFL for tasks assessed/performed Overall Cognitive Status: Within Functional Limits for tasks assessed                                        Exercises      General Comments        Pertinent Vitals/Pain Pain Assessment: Faces Faces Pain Scale: Hurts a little bit Pain Location: L foot Pain Descriptors / Indicators: Grimacing Pain Intervention(s): Limited activity within patient's tolerance;Monitored during session;Repositioned    Home Living                      Prior Function            PT Goals (current goals can now be found in the care plan section)  Acute Rehab PT Goals Patient Stated Goal: hopes to go to CIR, family eager to get RLE prosthesis PT Goal Formulation: With patient/family Time For Goal Achievement: 12/11/18 Potential to Achieve Goals: Fair Progress towards PT goals: Progressing toward goals    Frequency    Min 3X/week      PT Plan Current plan remains appropriate    Co-evaluation              AM-PAC PT "6 Clicks" Mobility   Outcome Measure  Help needed turning from your back to your side while in a flat bed without using bedrails?: A Lot Help needed moving from lying on your back to sitting on the side of a flat bed without using bedrails?: Total Help needed moving to and from a bed to a chair (including a wheelchair)?: Total Help needed standing up from a chair using your arms (e.g., wheelchair or bedside chair)?: Total Help needed to walk in hospital room?: Total Help needed climbing 3-5 steps with a railing? :  Total 6 Click Score: 7    End of Session Equipment Utilized During Treatment: Gait belt Activity Tolerance: Patient limited by fatigue;Treatment limited secondary to medical complications (Comment) Patient left: in bed;with call bell/phone within reach;with family/visitor present Nurse Communication: Mobility status PT Visit Diagnosis: Muscle weakness (generalized) (M62.81);Difficulty in walking, not elsewhere classified (R26.2);Adult, failure to thrive (R62.7);Pain Pain - Right/Left: Left Pain - part of body: Ankle and joints of foot     Time: 1343-1356 PT Time Calculation (min) (ACUTE ONLY): 13 min  Charges:  $Therapeutic Activity: 8-22 mins                    Blondell Reveal Kistler PT 12/01/2018  Acute Rehabilitation Services Pager [phone] Office [phone]

## 2018-12-01 NOTE — Plan of Care (Signed)
  Problem: Clinical Measurements: Goal: Will remain free from infection Outcome: Progressing   Problem: Coping: Goal: Level of anxiety will decrease Outcome: Progressing   Problem: Pain Managment: Goal: General experience of comfort will improve Outcome: Progressing   Problem: Safety: Goal: Ability to remain free from injury will improve Outcome: Progressing   Elesa Hacker, RN

## 2018-12-02 ENCOUNTER — Encounter (HOSPITAL_COMMUNITY)
Admission: EM | Disposition: A | Discharge status: Rehabilitation Facility | Payer: Self-pay | Source: Home / Self Care | Attending: Internal Medicine

## 2018-12-02 ENCOUNTER — Inpatient Hospital Stay (HOSPITAL_COMMUNITY): Payer: Medicare Other | Admitting: Anesthesiology

## 2018-12-02 HISTORY — PX: AMPUTATION: SHX166

## 2018-12-02 LAB — BASIC METABOLIC PANEL
Anion gap: 10 (ref 5–15)
BUN: 17 mg/dL (ref 8–23)
CO2: 21 mmol/L — ABNORMAL LOW (ref 22–32)
Calcium: 8.5 mg/dL — ABNORMAL LOW (ref 8.9–10.3)
Chloride: 103 mmol/L (ref 98–111)
Creatinine, Ser: 1.17 mg/dL (ref 0.61–1.24)
GFR calc Af Amer: >60 mL/min (ref >60)
GFR calc non Af Amer: 55 mL/min — ABNORMAL LOW (ref >60)
Glucose, Bld: 231 mg/dL — ABNORMAL HIGH (ref 70–99)
Potassium: 3.7 mmol/L (ref 3.5–5.1)
Sodium: 134 mmol/L — ABNORMAL LOW (ref 135–145)

## 2018-12-02 LAB — CBC
HCT: 29.1 % — ABNORMAL LOW (ref 39.0–52.0)
Hemoglobin: 10 g/dL — ABNORMAL LOW (ref 13.0–17.0)
MCH: 31.1 pg (ref 26.0–34.0)
MCHC: 34.4 g/dL (ref 30.0–36.0)
MCV: 90.4 fL (ref 80.0–100.0)
Platelets: 256 10*3/uL (ref 150–400)
RBC: 3.22 MIL/uL — ABNORMAL LOW (ref 4.22–5.81)
RDW: 16 % — ABNORMAL HIGH (ref 11.5–15.5)
WBC: 9 10*3/uL (ref 4.0–10.5)
nRBC: 0 % (ref 0.0–0.2)

## 2018-12-02 LAB — GLUCOSE, CAPILLARY
Glucose-Capillary: 201 mg/dL — ABNORMAL HIGH (ref 70–99)
Glucose-Capillary: 170 mg/dL — ABNORMAL HIGH (ref 70–99)
Glucose-Capillary: 147 mg/dL — ABNORMAL HIGH (ref 70–99)
Glucose-Capillary: 129 mg/dL — ABNORMAL HIGH (ref 70–99)
Glucose-Capillary: 198 mg/dL — ABNORMAL HIGH (ref 70–99)

## 2018-12-02 SURGERY — AMPUTATION, FOOT, PARTIAL
Anesthesia: General | Site: Foot | Laterality: Left

## 2018-12-02 MED ORDER — OXYCODONE HCL 5 MG PO TABS
5.0000 mg | ORAL_TABLET | ORAL | Status: DC | PRN
  Administered 2018-12-03 (×2): 10 mg via ORAL
  Filled 2018-12-02 (×3): qty 2

## 2018-12-02 MED ORDER — PROPOFOL 10 MG/ML IV BOLUS
INTRAVENOUS | Status: DC | PRN
  Administered 2018-12-02 (×3): 100 mg via INTRAVENOUS

## 2018-12-02 MED ORDER — FENTANYL CITRATE (PF) 100 MCG/2ML IJ SOLN: 25.0000 ug | INTRAMUSCULAR | Status: DC | PRN

## 2018-12-02 MED ORDER — PROPOFOL 10 MG/ML IV BOLUS
INTRAVENOUS | Status: AC
  Filled 2018-12-02: qty 20

## 2018-12-02 MED ORDER — PROPOFOL 1000 MG/100ML IV EMUL
INTRAVENOUS | Status: AC
  Filled 2018-12-02: qty 100

## 2018-12-02 MED ORDER — MORPHINE SULFATE (PF) 2 MG/ML IV SOLN
1.0000 mg | INTRAVENOUS | Status: DC | PRN
  Administered 2018-12-02 – 2018-12-03 (×5): 1 mg via INTRAVENOUS
  Filled 2018-12-02 (×5): qty 1

## 2018-12-02 MED ORDER — LIDOCAINE 2% (20 MG/ML) 5 ML SYRINGE
INTRAMUSCULAR | Status: AC
  Filled 2018-12-02: qty 5

## 2018-12-02 MED ORDER — FENTANYL CITRATE (PF) 250 MCG/5ML IJ SOLN
INTRAMUSCULAR | Status: AC
  Filled 2018-12-02: qty 5

## 2018-12-02 MED ORDER — ONDANSETRON HCL 4 MG/2ML IJ SOLN
INTRAMUSCULAR | Status: AC
  Filled 2018-12-02: qty 2

## 2018-12-02 MED ORDER — SENNA 8.6 MG PO TABS
1.0000 | ORAL_TABLET | Freq: Two times a day (BID) | ORAL | Status: DC
  Administered 2018-12-02 – 2018-12-07 (×8): 8.6 mg via ORAL
  Filled 2018-12-02 (×10): qty 1

## 2018-12-02 MED ORDER — LACTATED RINGERS IV SOLN
INTRAVENOUS | Status: DC
  Administered 2018-12-02: 11:00:00 via INTRAVENOUS

## 2018-12-02 MED ORDER — ONDANSETRON HCL 4 MG/2ML IJ SOLN: 4.0000 mg | Freq: Once | INTRAMUSCULAR | Status: DC | PRN

## 2018-12-02 MED ORDER — MEPERIDINE HCL 25 MG/ML IJ SOLN: 6.2500 mg | INTRAMUSCULAR | Status: DC | PRN

## 2018-12-02 MED ORDER — ACETAMINOPHEN 325 MG PO TABS: 325.0000 mg | ORAL_TABLET | ORAL | Status: DC | PRN

## 2018-12-02 MED ORDER — 0.9 % SODIUM CHLORIDE (POUR BTL) OPTIME
TOPICAL | Status: DC | PRN
  Administered 2018-12-02: 1000 mL

## 2018-12-02 MED ORDER — EPHEDRINE SULFATE 50 MG/ML IJ SOLN
INTRAMUSCULAR | Status: DC | PRN
  Administered 2018-12-02: 10 mg via INTRAVENOUS
  Administered 2018-12-02: 5 mg via INTRAVENOUS
  Administered 2018-12-02: 10 mg via INTRAVENOUS

## 2018-12-02 MED ORDER — CEFAZOLIN SODIUM-DEXTROSE 1-4 GM/50ML-% IV SOLN
1.0000 g | Freq: Four times a day (QID) | INTRAVENOUS | Status: AC
  Administered 2018-12-02 – 2018-12-03 (×3): 1 g via INTRAVENOUS
  Filled 2018-12-02 (×3): qty 50

## 2018-12-02 MED ORDER — OXYCODONE HCL 5 MG/5ML PO SOLN: 5.0000 mg | Freq: Once | ORAL | Status: DC | PRN

## 2018-12-02 MED ORDER — ENSURE PRE-SURGERY PO LIQD
296.0000 mL | Freq: Once | ORAL | Status: AC
  Administered 2018-12-02: 296 mL via ORAL
  Filled 2018-12-02: qty 296

## 2018-12-02 MED ORDER — ONDANSETRON HCL 4 MG/2ML IJ SOLN
INTRAMUSCULAR | Status: DC | PRN
  Administered 2018-12-02: 4 mg via INTRAVENOUS

## 2018-12-02 MED ORDER — ACETAMINOPHEN 160 MG/5ML PO SOLN: 325.0000 mg | ORAL | Status: DC | PRN

## 2018-12-02 MED ORDER — CEFAZOLIN SODIUM-DEXTROSE 2-4 GM/100ML-% IV SOLN
INTRAVENOUS | Status: AC
  Filled 2018-12-02: qty 100

## 2018-12-02 MED ORDER — LIDOCAINE HCL (CARDIAC) PF 100 MG/5ML IV SOSY
PREFILLED_SYRINGE | INTRAVENOUS | Status: DC | PRN
  Administered 2018-12-02: 100 mg via INTRATRACHEAL

## 2018-12-02 MED ORDER — OXYCODONE HCL 5 MG PO TABS: 5.0000 mg | ORAL_TABLET | Freq: Once | ORAL | Status: DC | PRN

## 2018-12-02 MED ORDER — CEFAZOLIN SODIUM-DEXTROSE 2-3 GM-%(50ML) IV SOLR
INTRAVENOUS | Status: DC | PRN
  Administered 2018-12-02: 2 g via INTRAVENOUS

## 2018-12-02 MED ORDER — EPHEDRINE 5 MG/ML INJ
INTRAVENOUS | Status: AC
  Filled 2018-12-02: qty 10

## 2018-12-02 SURGICAL SUPPLY — 35 items
BENZOIN TINCTURE PRP APPL 2/3 (GAUZE/BANDAGES/DRESSINGS) ×2 IMPLANT
BLADE SAW SGTL HD 18.5X60.5X1. (BLADE) ×2 IMPLANT
BLADE SURG 21 STRL SS (BLADE) ×2 IMPLANT
BNDG COHESIVE 3X5 TAN STRL LF (GAUZE/BANDAGES/DRESSINGS) ×1 IMPLANT
BNDG COHESIVE 4X5 TAN STRL (GAUZE/BANDAGES/DRESSINGS) ×2 IMPLANT
BNDG GAUZE ELAST 4 BULKY (GAUZE/BANDAGES/DRESSINGS) IMPLANT
CANISTER WOUNDNEG PRESSURE 500 (CANNISTER) ×1 IMPLANT
COVER SURGICAL LIGHT HANDLE (MISCELLANEOUS) ×2 IMPLANT
DRAPE INCISE IOBAN 66X45 STRL (DRAPES) ×2 IMPLANT
DRAPE U-SHAPE 47X51 STRL (DRAPES) ×2 IMPLANT
DRESSING PREVENA PLUS CUSTOM (GAUZE/BANDAGES/DRESSINGS) IMPLANT
DRSG ADAPTIC 3X8 NADH LF (GAUZE/BANDAGES/DRESSINGS) IMPLANT
DRSG PAD ABDOMINAL 8X10 ST (GAUZE/BANDAGES/DRESSINGS) IMPLANT
DRSG PREVENA PLUS CUSTOM (GAUZE/BANDAGES/DRESSINGS) ×2
DURAPREP 26ML APPLICATOR (WOUND CARE) ×2 IMPLANT
ELECT REM PT RETURN 9FT ADLT (ELECTROSURGICAL) ×2
ELECTRODE REM PT RTRN 9FT ADLT (ELECTROSURGICAL) ×1 IMPLANT
GAUZE SPONGE 4X4 12PLY STRL (GAUZE/BANDAGES/DRESSINGS) IMPLANT
GLOVE BIOGEL PI IND STRL 9 (GLOVE) ×1 IMPLANT
GLOVE BIOGEL PI INDICATOR 9 (GLOVE) ×1
GLOVE SURG ORTHO 9.0 STRL STRW (GLOVE) ×2 IMPLANT
GOWN STRL REUS W/ TWL XL LVL3 (GOWN DISPOSABLE) ×3 IMPLANT
GOWN STRL REUS W/TWL XL LVL3 (GOWN DISPOSABLE) ×3
KIT BASIN OR (CUSTOM PROCEDURE TRAY) ×2 IMPLANT
KIT TURNOVER KIT B (KITS) ×2 IMPLANT
NS IRRIG 1000ML POUR BTL (IV SOLUTION) ×2 IMPLANT
PACK ORTHO EXTREMITY (CUSTOM PROCEDURE TRAY) ×2 IMPLANT
PAD ARMBOARD 7.5X6 YLW CONV (MISCELLANEOUS) ×4 IMPLANT
SPONGE LAP 18X18 RF (DISPOSABLE) IMPLANT
SUT ETHILON 2 0 PSLX (SUTURE) ×4 IMPLANT
TOWEL GREEN STERILE (TOWEL DISPOSABLE) ×2 IMPLANT
TOWEL GREEN STERILE FF (TOWEL DISPOSABLE) ×2 IMPLANT
TUBE CONNECTING 12X1/4 (SUCTIONS) ×2 IMPLANT
WATER STERILE IRR 1000ML POUR (IV SOLUTION) ×2 IMPLANT
YANKAUER SUCT BULB TIP NO VENT (SUCTIONS) ×2 IMPLANT

## 2018-12-02 NOTE — Transfer of Care (Signed)
Immediate Anesthesia Transfer of Care Note  Patient: [name]  Procedure(s) Performed: LEFT TRANSMETATARSAL AMPUTATION AND NEGATIVE PRESSURE WOUND VAC PLACEMENT (Left Foot)  Patient Location: PACU  Anesthesia Type:General  Level of Consciousness: drowsy and patient cooperative  Airway & Oxygen Therapy: Patient Spontanous Breathing and Patient connected to face mask oxygen  Post-op Assessment: Report given to RN and Post -op Vital signs reviewed and stable  Post vital signs: Reviewed and stable  Last Vitals:  Vitals Value Taken Time  BP 102/51 12/02/18 1208  Temp    Pulse 62 12/02/18 1211  Resp 13 12/02/18 1211  SpO2 98 % 12/02/18 1211  Vitals shown include unvalidated device data.  Last Pain:  Vitals:   12/02/18 0812  TempSrc:   PainSc: 0-No pain      Patients Stated Pain Goal: 3 (76/70/11 0034)  Complications: No apparent anesthesia complications

## 2018-12-02 NOTE — Interval H&P Note (Signed)
History and Physical Interval Note:  12/02/2018 6:50 AM  [name]  has presented today for surgery, with the diagnosis of Gangrene Left Foot.  The various methods of treatment have been discussed with the patient and family. After consideration of risks, benefits and other options for treatment, the patient has consented to  Procedure(s): LEFT TRANSMETATARSAL AMPUTATION (Left) as a surgical intervention.  The patient's history has been reviewed, patient examined, no change in status, stable for surgery.  I have reviewed the patient's chart and labs.  Questions were answered to the patient's satisfaction.     [name]

## 2018-12-02 NOTE — Anesthesia Preprocedure Evaluation (Signed)
Anesthesia Evaluation  Patient identified by MRN, date of birth, ID band Patient awake    Reviewed: Allergy & Precautions, NPO status , Patient's Chart, lab work & pertinent test results  Airway Mallampati: II  TM Distance: >3 FB Neck ROM: Full    Dental no notable dental hx. (+) Upper Dentures, Lower Dentures   Pulmonary sleep apnea , former smoker,    Pulmonary exam normal breath sounds clear to auscultation       Cardiovascular hypertension, Pt. on medications + Peripheral Vascular Disease and +CHF  Normal cardiovascular exam+ dysrhythmias + Valvular Problems/Murmurs AS  Rhythm:Regular Rate:Normal  ECHO 11/19 Left ventricle: The cavity size was normal. There was moderate   concentric hypertrophy. Systolic function was normal. The   estimated ejection fraction was in the range of 60% to 65%. Wall   motion was normal; there were no regional wall motion   abnormalities. Features are consistent with a pseudonormal left   ventricular filling pattern, with concomitant abnormal relaxation   and increased filling pressure (grade 2 diastolic dysfunction).   Doppler parameters are consistent with high ventricular filling   pressure. - Aortic valve: Trileaflet; mildly thickened, moderately calcified   leaflets. There was moderate stenosis. Valve area (VTI): 1.47   cm^2. Valve area (Vmax): 1.4 cm^2. Valve area (Vmean): 1.38 cm^2. - Mitral valve: Mildly calcified annulus. Mildly thickened leaflets   . There was mild regurgitation. - Left atrium: The atrium was mildly to moderately dilated. - Tricuspid valve: There was mild regurgitation. - Pulmonary arteries: PA peak pressure: 31 mm Hg (S).   Neuro/Psych negative neurological ROS  negative psych ROS   GI/Hepatic Neg liver ROS, GERD  ,  Endo/Other  diabetes, Type 2  Renal/GU CRFRenal disease  negative genitourinary   Musculoskeletal negative musculoskeletal ROS (+)   Abdominal   Peds negative pediatric ROS (+)  Hematology  (+) Blood dyscrasia, anemia ,   Anesthesia Other Findings   Reproductive/Obstetrics negative OB ROS                             Anesthesia Physical  Anesthesia Plan  ASA: III  Anesthesia Plan: General   Post-op Pain Management:    Induction: Intravenous  PONV Risk Score and Plan: 2 and Ondansetron and Treatment may vary due to age or medical condition  Airway Management Planned: LMA  Additional Equipment:   Intra-op Plan:   Post-operative Plan:   Informed Consent: I have reviewed the patients History and Physical, chart, labs and discussed the procedure including the risks, benefits and alternatives for the proposed anesthesia with the patient or authorized representative who has indicated his/her understanding and acceptance.     Dental advisory given  Plan Discussed with: CRNA  Anesthesia Plan Comments:         Anesthesia Quick Evaluation

## 2018-12-02 NOTE — Op Note (Signed)
12/02/2018  12:06 PM  PATIENT:  [name]    PRE-OPERATIVE DIAGNOSIS:  Gangrene Left Foot  POST-OPERATIVE DIAGNOSIS:  Same  PROCEDURE:  LEFT TRANSMETATARSAL AMPUTATION AND NEGATIVE PRESSURE WOUND VAC PLACEMENT  SURGEON:  [name], MD  PHYSICIAN ASSISTANT:None ANESTHESIA:   General  PREOPERATIVE INDICATIONS:  [name] is a  85 y.o. male with a diagnosis of Gangrene Left Foot who failed conservative measures and elected for surgical management.    The risks benefits and alternatives were discussed with the patient preoperatively including but not limited to the risks of infection, bleeding, nerve injury, cardiopulmonary complications, the need for revision surgery, among others, and the patient was willing to proceed.  OPERATIVE IMPLANTS: Praveena customizable wound VAC  @ENCIMAGES @  OPERATIVE FINDINGS: Essentially no petechial bleeding, small amount of blood from the dorsalis pedis artery, no deep abscess.  OPERATIVE PROCEDURE: Patient was brought the operating room and underwent a general anesthetic.  After adequate levels anesthesia were obtained patient's left lower extremity was prepped using DuraPrep draped into a sterile field a timeout was called.  A fishmouth incision was made just proximal to the metatarsal heads.  This was proximal to the ischemic changes that extended from the great toe to the fifth metatarsal head.  This was carried down to bone and a oscillating saw was used to perform a transmetatarsal amputation beveled plantarly.  Patient had essentially no petechial bleeding there was some small bleeding from the dorsalis pedis artery.  Electrocautery was used hemostasis the wound was irrigated with normal saline.  The incision was closed using 2-0 nylon.  A Praveena wound VAC was applied this had a good suction fit patient was extubated taken the PACU in stable condition.   DISCHARGE PLANNING:  Antibiotic duration: Continue antibiotics for 24  hours  Weightbearing: Nonweightbearing on the left  Pain medication: Opioid pathway  Dressing care/ Wound VAC: Continue wound VAC for 1 week  Ambulatory devices: Transfer training only  Discharge to: Skilled nursing.  Follow-up: In the office 1 week post operative.

## 2018-12-02 NOTE — Anesthesia Postprocedure Evaluation (Signed)
Anesthesia Post Note  Patient: [name]  Procedure(s) Performed: LEFT TRANSMETATARSAL AMPUTATION AND NEGATIVE PRESSURE WOUND VAC PLACEMENT (Left Foot)     Patient location during evaluation: PACU Anesthesia Type: General Level of consciousness: awake and alert Pain management: pain level controlled Vital Signs Assessment: post-procedure vital signs reviewed and stable Respiratory status: spontaneous breathing, nonlabored ventilation, respiratory function stable and patient connected to nasal cannula oxygen Cardiovascular status: blood pressure returned to baseline and stable Postop Assessment: no apparent nausea or vomiting Anesthetic complications: no    Last Vitals:  Vitals:   12/02/18 1330 12/02/18 1426  BP:    Pulse:    Resp:    Temp: 36.4 C 36.4 C  SpO2:      Last Pain:  Vitals:   12/02/18 1426  TempSrc: Oral  PainSc:    Pain Goal: Patients Stated Pain Goal: 3 (12/02/18 1029)                 Frankye Schwegel

## 2018-12-02 NOTE — Progress Notes (Signed)
PROGRESS NOTE    [name]  XKG:[id number] DOB: 1927-04-11 DOA: 11/18/2018 PCP: Redmond School, MD   Brief Narrative: 85 year old past medical history significant for hypertension, hyperlipidemia, diabetes type 2, chronic kidney disease a stage III, PE, A. fib, diastolic heart failure, PAD status post right BKA admitted from home with with black discoloration of first toe on the left foot.  X-ray show evidence of questionable osteomyelitis but reviewing final x-ray, no osteomyelitis was detected.  Patient was admitted on November the for for further management of dry gangrene and cellulitis of the left foot. Patient was a started on IV vancomycin and Zosyn.    Assessment & Plan:   Principal Problem:   Toe infection Active Problems:   Peripheral vascular disease (HCC)   Essential hypertension, benign   Hyperlipidemia   Aortic valve stenosis   Hypothyroidism   PAF (paroxysmal atrial fibrillation) (HCC)   Type 2 diabetes mellitus with foot ulcer, without long-term current use of insulin (HCC)   Gangrene of left foot (HCC)   Critical limb ischemia with history of revascularization of same extremity  1-First left toe with dry gangrene/ischemic ulcer and cellulitis in the setting of peripheral vascular disease with critical limb ischemia of the left great toe: Patient was a started on IV antibiotics. Blood cultures x2 no growth today. Status post successful complex revascularization of the left TP trunk into the peroneal artery using 2 overlapping drug-eluting stent as well as drug-coated balloon angioplasty of the distal left popliteal artery. Continue with aspirin and Plavix.  Plan is to DC aspirin when Eliquis is resumed per cardiology. Plan for transmetatarsal amputation today, 12/02/2018 Completed antibiotics.  2-Hypertension: Continue with Lasix and amlodipine  3-Hyperlipidemia: Continue Lipitor  4-Hypothyroidism: Continue with Synthroid  Chronic kidney disease a stage  IIIb: Creatinine baseline 1.5--1.7.  Stable Lasix was resumed lower dose   Anemia: Questionable of delusional. Monitor hemoglobin. Check anemia panel.  Hemoglobin stable at 10. Diabetes type 2 with foot ulcer: Hold glipizide Continue sliding scale insulin  BPH: Continue Flomax and finasteride Pressure injury left heel stage III: Present on admission  Pressure Injury 09/10/18 Heel Left Stage II -  Partial thickness loss of dermis presenting as a shallow open ulcer with a red, pink wound bed without slough. was charted on 09/10/18 as a stage 1 under other wound column, now is a stage 2/open blister (Active)  09/10/18 1718  Location: Heel  Location Orientation: Left  Staging: Stage II -  Partial thickness loss of dermis presenting as a shallow open ulcer with a red, pink wound bed without slough.  Wound Description (Comments): was charted on 09/10/18 as a stage 1 under other wound column, now is a stage 2/open blister  Present on Admission: Yes     Estimated body mass index is 29.8 kg/m as calculated from the following:   Height as of this encounter: 5\' 10"  (1.778 m).   Weight as of this encounter: 94.2 kg.   DVT prophylaxis:  Code Status: DNR Family Communication: Care discussed with patient Disposition Plan: Plan for transmetatarsal amputation today. Consultants:   Dr. Sharol Given  Cardiology  Procedures:   Transmetatarsal amputation 11/18  Antimicrobials:    Subjective: He denies chest pain, dyspnea.   Objective: Vitals:   12/02/18 1308 12/02/18 1323 12/02/18 1330 12/02/18 1426  BP: (!) 121/54 (!) 123/57    Pulse: (!) 56 (!) 55    Resp: 13 11    Temp:   97.6 F (36.4 C) 97.6 F (36.4 C)  TempSrc:    Oral  SpO2: 92% 93%    Weight:      Height:        Intake/Output Summary (Last 24 hours) at 12/02/2018 1436 Last data filed at 12/02/2018 1203 Gross per 24 hour  Intake 960 ml  Output 725 ml  Net 235 ml   Filed Weights   11/29/18 0516 11/30/18 0437  12/01/18 0500  Weight: 105.1 kg 94.8 kg 94.2 kg    Examination:  General exam: Appears calm and comfortable  Respiratory system: Clear to auscultation. Respiratory effort normal. Cardiovascular system: S1 & S2 heard, RRR. No JVD, systolic murmurs. No pedal edema. Gastrointestinal system: Abdomen is nondistended, soft and nontender. No organomegaly or masses felt. Normal bowel sounds heard. Central nervous system: Alert and oriented. No focal neurological deficits. Extremities: Symmetric 5 x 5 power. Skin: discoloration left toes, gangrene. Psychiatry: Judgement and insight appear normal. Mood & affect appropriate.     Data Reviewed: I have personally reviewed following labs and imaging studies  CBC: Recent Labs  Lab 11/26/18 0501 11/27/18 0929 12/02/18 0828  WBC 11.3* 10.3 9.0  HGB 8.6* 8.9* 10.0*  HCT 26.9* 27.0* 29.1*  MCV 94.1 91.8 90.4  PLT 242 233 324   Basic Metabolic Panel: Recent Labs  Lab 11/26/18 0501 11/27/18 0509 11/30/18 0649 12/02/18 0828  NA 137 138 134* 134*  K 3.8 4.2 3.8 3.7  CL 105 106 100 103  CO2 23 23 22  21*  GLUCOSE 182* 113* 134* 231*  BUN 35* 28* 19 17  CREATININE 1.67* 1.27* 1.25* 1.17  CALCIUM 8.4* 8.4* 8.5* 8.5*   GFR: Estimated Creatinine Clearance: 49.3 mL/min (by C-G formula based on SCr of 1.17 mg/dL). Liver Function Tests: No results for input(s): AST, ALT, ALKPHOS, BILITOT, PROT, ALBUMIN in the last 168 hours. No results for input(s): LIPASE, AMYLASE in the last 168 hours. No results for input(s): AMMONIA in the last 168 hours. Coagulation Profile: No results for input(s): INR, PROTIME in the last 168 hours. Cardiac Enzymes: No results for input(s): CKTOTAL, CKMB, CKMBINDEX, TROPONINI in the last 168 hours. BNP (last 3 results) No results for input(s): PROBNP in the last 8760 hours. HbA1C: No results for input(s): HGBA1C in the last 72 hours. CBG: Recent Labs  Lab 12/01/18 1709 12/01/18 2138 12/02/18 0801 12/02/18  1029 12/02/18 1220  GLUCAP 154* 147* 201* 170* 147*   Lipid Profile: No results for input(s): CHOL, HDL, LDLCALC, TRIG, CHOLHDL, LDLDIRECT in the last 72 hours. Thyroid Function Tests: No results for input(s): TSH, T4TOTAL, FREET4, T3FREE, THYROIDAB in the last 72 hours. Anemia Panel: No results for input(s): VITAMINB12, FOLATE, FERRITIN, TIBC, IRON, RETICCTPCT in the last 72 hours. Sepsis Labs: No results for input(s): PROCALCITON, LATICACIDVEN in the last 168 hours.  No results found for this or any previous visit (from the past 240 hour(s)).       Radiology Studies: No results found.      Scheduled Meds: . amLODipine  10 mg Oral Daily  . aspirin EC  81 mg Oral Daily  . atorvastatin  40 mg Oral Daily  . clopidogrel  75 mg Oral Q breakfast  . collagenase   Topical Daily  . dorzolamide-timolol  1 drop Both Eyes BID  . enoxaparin (LOVENOX) injection  40 mg Subcutaneous Daily  . famotidine  20 mg Oral Daily  . finasteride  5 mg Oral QPM  . furosemide  20 mg Oral Daily  . insulin aspart  0-15 Units Subcutaneous TID WC  .  insulin aspart  0-5 Units Subcutaneous QHS  . levothyroxine  50 mcg Oral Q0600  . potassium chloride  10 mEq Oral Daily  . senna  1 tablet Oral BID  . sodium chloride flush  10-40 mL Intracatheter Q12H  . sodium chloride flush  3 mL Intravenous Q12H  . sodium chloride flush  3 mL Intravenous Q12H  . sodium chloride flush  3 mL Intravenous Q12H  . tamsulosin  0.4 mg Oral BID   Continuous Infusions: . sodium chloride 250 mL (11/26/18 1031)  . sodium chloride    . ceFAZolin    .  ceFAZolin (ANCEF) IV    . lactated ringers 50 mL/hr at 12/02/18 1032  . metronidazole 500 mg (12/02/18 0527)     LOS: 13 days    Time spent: 39 minutes./      A , MD [facility]   If 7PM-7AM, please contact night-coverage [email or web address] Password [email or web address] 12/02/2018, 2:36 PM

## 2018-12-02 NOTE — Plan of Care (Signed)
  Problem: Education: Goal: Knowledge of General Education information will improve Description: Including pain rating scale, medication(s)/side effects and non-pharmacologic comfort measures Outcome: Progressing   Problem: Health Behavior/Discharge Planning: Goal: Ability to manage health-related needs will improve Outcome: Progressing   Problem: Clinical Measurements: Goal: Ability to maintain clinical measurements within normal limits will improve Outcome: Progressing Goal: Will remain free from infection Outcome: Progressing   Problem: Activity: Goal: Risk for activity intolerance will decrease Outcome: Progressing   Problem: Coping: Goal: Level of anxiety will decrease Outcome: Progressing   Problem: Pain Managment: Goal: General experience of comfort will improve Outcome: Progressing   Problem: Safety: Goal: Ability to remain free from injury will improve Outcome: Progressing   Problem: Skin Integrity: Goal: Risk for impaired skin integrity will decrease Outcome: Progressing   Elesa Hacker, RN

## 2018-12-02 NOTE — Anesthesia Procedure Notes (Addendum)
Procedure Name: LMA Insertion Date/Time: 12/02/2018 11:47 AM Performed by: Kathryne Hitch, CRNA Pre-anesthesia Checklist: Patient identified, Emergency Drugs available, Suction available and Patient being monitored Patient Re-evaluated:Patient Re-evaluated prior to induction Oxygen Delivery Method: Circle system utilized Preoxygenation: Pre-oxygenation with 100% oxygen Induction Type: IV induction Ventilation: Mask ventilation without difficulty LMA: LMA inserted LMA Size: 5.0 Placement Confirmation: breath sounds checked- equal and bilateral Tube secured with: Tape Dental Injury: Teeth and Oropharynx as per pre-operative assessment

## 2018-12-03 ENCOUNTER — Encounter (HOSPITAL_COMMUNITY): Payer: Self-pay | Admitting: Orthopedic Surgery

## 2018-12-03 LAB — GLUCOSE, CAPILLARY
Glucose-Capillary: 115 mg/dL — ABNORMAL HIGH (ref 70–99)
Glucose-Capillary: 189 mg/dL — ABNORMAL HIGH (ref 70–99)
Glucose-Capillary: 119 mg/dL — ABNORMAL HIGH (ref 70–99)
Glucose-Capillary: 177 mg/dL — ABNORMAL HIGH (ref 70–99)

## 2018-12-03 LAB — CBC
HCT: 29.7 % — ABNORMAL LOW (ref 39.0–52.0)
Hemoglobin: 10 g/dL — ABNORMAL LOW (ref 13.0–17.0)
MCH: 30.8 pg (ref 26.0–34.0)
MCHC: 33.7 g/dL (ref 30.0–36.0)
MCV: 91.4 fL (ref 80.0–100.0)
Platelets: 294 10*3/uL (ref 150–400)
RBC: 3.25 MIL/uL — ABNORMAL LOW (ref 4.22–5.81)
RDW: 15.9 % — ABNORMAL HIGH (ref 11.5–15.5)
WBC: 9.1 10*3/uL (ref 4.0–10.5)
nRBC: 0 % (ref 0.0–0.2)

## 2018-12-03 LAB — BASIC METABOLIC PANEL
Anion gap: 10 (ref 5–15)
BUN: 17 mg/dL (ref 8–23)
CO2: 24 mmol/L (ref 22–32)
Calcium: 8.6 mg/dL — ABNORMAL LOW (ref 8.9–10.3)
Chloride: 99 mmol/L (ref 98–111)
Creatinine, Ser: 1.23 mg/dL (ref 0.61–1.24)
GFR calc Af Amer: 60 mL/min — ABNORMAL LOW (ref >60)
GFR calc non Af Amer: 52 mL/min — ABNORMAL LOW (ref >60)
Glucose, Bld: 143 mg/dL — ABNORMAL HIGH (ref 70–99)
Potassium: 3.8 mmol/L (ref 3.5–5.1)
Sodium: 133 mmol/L — ABNORMAL LOW (ref 135–145)

## 2018-12-03 MED ORDER — APIXABAN 2.5 MG PO TABS
2.5000 mg | ORAL_TABLET | Freq: Two times a day (BID) | ORAL | Status: DC
  Administered 2018-12-03 – 2018-12-04 (×3): 2.5 mg via ORAL
  Filled 2018-12-03 (×3): qty 1

## 2018-12-03 NOTE — Evaluation (Addendum)
Physical Therapy Re-Evaluation Patient Details Name: [name] MRN: [id number] DOB: Feb 05, 1927 Today's Date: 12/03/2018   History of Present Illness  85 year old past medical history significant for HTN, hyperlipidemia, diabetes type 2, chronic kidney disease a stage III, PE, A. fib, diastolic heart failure, right BKA admitted from home with with black discoloration of first toe on the left foot.  Now s/p L transmetatarsal amputation of L foot    Clinical Impression  Pt in bed upon arrival of PT/OT, agreeable to session. Pt able to initiate movements for bed mobility rolling to perform pericare and placement of lift pad. Pt with good use of BUE, but needs extra assist of 1 to complete motions. Pt then moved to recliner using lift with assist of 2 for safety and line management, pt tolerated well but some increased in LLE with dependent position. Pt demos good mobility in recliner, able to reposition and maintain seated balance with minA from PT. Pt will continue to benefit from skilled PT to maximize rehab and improve functional mobility.     Follow Up Recommendations CIR;Supervision/Assistance - 24 hour    Equipment Recommendations  None recommended by PT    Recommendations for Other Services Rehab consult     Precautions / Restrictions Precautions Precautions: Fall Precaution Comments: wound vac L foot, preexisting R BKA Restrictions Weight Bearing Restrictions: Yes LLE Weight Bearing: Non weight bearing Other Position/Activity Restrictions: R BK amp is healed      Mobility  Bed Mobility Overal bed mobility: Needs Assistance Bed Mobility: Rolling Rolling: Mod assist         General bed mobility comments: cues for sequencing, use of railings, and assist for hips with bed pad  Transfers Overall transfer level: Needs assistance Equipment used: Ambulation equipment used             General transfer comment: +2 for safety with lift  Ambulation/Gait             General Gait Details: not ambulatory yet since amp of R LE  Stairs            Wheelchair Mobility    Modified Rankin (Stroke Patients Only)       Balance Overall balance assessment: Needs assistance Sitting-balance support: Single extremity supported;Bilateral upper extremity supported Sitting balance-Leahy Scale: Fair Sitting balance - Comments: in recliner, able to bring trunk forwards and backwards without assist     Standing balance-Leahy Scale: Zero Standing balance comment: NT, in maximove                             Pertinent Vitals/Pain Pain Assessment: Faces Faces Pain Scale: Hurts little more Pain Location: L foot, with mobility and when in dependent position Pain Descriptors / Indicators: Grimacing;Throbbing;Sore Pain Intervention(s): Limited activity within patient's tolerance;Monitored during session;Repositioned;Premedicated before session    Home Living Family/patient expects to be discharged to:: Inpatient rehab Living Arrangements: Children Available Help at Discharge: Family;Available 24 hours/day Type of Home: House Home Access: Ramped entrance     Home Layout: One level Home Equipment: Walker - 2 wheels;Wheelchair - [employer];Hospital bed Additional Comments: hoyer lift    Prior Function Level of Independence: Needs assistance   Gait / Transfers Assistance Needed: family using lift at home  ADL's / Homemaking Assistance Needed: family assists bathing and dressing  Comments: was driving, grocery shopping and amb without AD up until 6-8 weeks ago     Hand Dominance   Dominant  Hand: Right    Extremity/Trunk Assessment   Upper Extremity Assessment Upper Extremity Assessment: Overall WFL for tasks assessed    Lower Extremity Assessment Lower Extremity Assessment: Generalized weakness;LLE deficits/detail;RLE deficits/detail RLE Deficits / Details: Increased challenge with SLR despite prior BKA LLE Deficits  / Details: unable to fully assess due to NWB status, pt able to complete SLR to facilitate bed mobility LLE: Unable to fully assess due to pain    Cervical / Trunk Assessment Cervical / Trunk Assessment: Normal  Communication   Communication: HOH  Cognition Arousal/Alertness: Awake/alert Behavior During Therapy: WFL for tasks assessed/performed Overall Cognitive Status: Within Functional Limits for tasks assessed                                        General Comments      Exercises     Assessment/Plan    PT Assessment Patient needs continued PT services  PT Problem List Decreased strength;Decreased range of motion;Decreased activity tolerance;Decreased balance;Decreased mobility;Decreased coordination;Decreased knowledge of use of DME;Decreased safety awareness;Cardiopulmonary status limiting activity;Decreased skin integrity;Pain;Obesity       PT Treatment Interventions DME instruction;Functional mobility training;Therapeutic activities;Therapeutic exercise;Balance training;Neuromuscular re-education;Patient/family education    PT Goals (Current goals can be found in the Care Plan section)  Acute Rehab PT Goals Patient Stated Goal: Get to CIR and maximize independence PT Goal Formulation: With patient/family Time For Goal Achievement: 12/11/18 Potential to Achieve Goals: Fair    Frequency Min 3X/week   Barriers to discharge Inaccessible home environment;Decreased caregiver support home with children, but one at a time    Co-evaluation PT/OT/SLP Co-Evaluation/Treatment: Yes Reason for Co-Treatment: Complexity of the patient's impairments (multi-system involvement);Necessary to address cognition/behavior during functional activity PT goals addressed during session: Mobility/safety with mobility;Balance OT goals addressed during session: ADL's and self-care;Strengthening/ROM       AM-PAC PT "6 Clicks" Mobility  Outcome Measure Help needed turning  from your back to your side while in a flat bed without using bedrails?: A Lot Help needed moving from lying on your back to sitting on the side of a flat bed without using bedrails?: Total Help needed moving to and from a bed to a chair (including a wheelchair)?: Total Help needed standing up from a chair using your arms (e.g., wheelchair or bedside chair)?: Total Help needed to walk in hospital room?: Total Help needed climbing 3-5 steps with a railing? : Total 6 Click Score: 7    End of Session Equipment Utilized During Treatment: Gait belt Activity Tolerance: Patient tolerated treatment well Patient left: with call bell/phone within reach;with family/visitor present;in chair;with chair alarm set Nurse Communication: Mobility status;Need for lift equipment PT Visit Diagnosis: Muscle weakness (generalized) (M62.81);Difficulty in walking, not elsewhere classified (R26.2);Adult, failure to thrive (R62.7);Pain Pain - Right/Left: Left Pain - part of body: Ankle and joints of foot    Time: 1000-1026 PT Time Calculation (min) (ACUTE ONLY): 26 min   Charges:   PT Evaluation $PT Re-evaluation: 1 Re-eval          Mickey Farber, PT, DPT   Acute Rehabilitation Department [phone]  Otho Bellows 12/03/2018, 11:50 AM

## 2018-12-03 NOTE — Progress Notes (Signed)
Inpatient Rehabilitation Admissions Coordinator  I met with patient with his son at bedside. Pt previously at CIR 08/2018 with insurance initially denying admit, but overturned during expedited appeal. Son is aware that same scenario likely to occur with no guarantee for approval. I will follow up once I hear from [employer].   , RN, MSN Rehab Admissions Coordinator [phone] 12/03/2018 1:00 PM  

## 2018-12-03 NOTE — Progress Notes (Addendum)
Progress Note  Patient Name: [name] Date of Encounter: 12/03/2018  Primary Cardiologist: Pixie Casino, MD   Subjective   Mr. Dehn reports that he is having some pain in his left foot. Denies any chest pain, SOB, or palpitations.   Inpatient Medications    Scheduled Meds: . amLODipine  10 mg Oral Daily  . aspirin EC  81 mg Oral Daily  . atorvastatin  40 mg Oral Daily  . clopidogrel  75 mg Oral Q breakfast  . collagenase   Topical Daily  . dorzolamide-timolol  1 drop Both Eyes BID  . enoxaparin (LOVENOX) injection  40 mg Subcutaneous Daily  . famotidine  20 mg Oral Daily  . finasteride  5 mg Oral QPM  . furosemide  20 mg Oral Daily  . insulin aspart  0-15 Units Subcutaneous TID WC  . insulin aspart  0-5 Units Subcutaneous QHS  . levothyroxine  50 mcg Oral Q0600  . potassium chloride  10 mEq Oral Daily  . senna  1 tablet Oral BID  . sodium chloride flush  10-40 mL Intracatheter Q12H  . sodium chloride flush  3 mL Intravenous Q12H  . sodium chloride flush  3 mL Intravenous Q12H  . sodium chloride flush  3 mL Intravenous Q12H  . tamsulosin  0.4 mg Oral BID   Continuous Infusions: . sodium chloride 250 mL (11/26/18 1031)  . sodium chloride    . lactated ringers 50 mL/hr at 12/02/18 1032  . metronidazole 500 mg (12/03/18 0533)   PRN Meds: sodium chloride, sodium chloride, acetaminophen, morphine injection, ondansetron (ZOFRAN) IV, oxyCODONE, sodium chloride flush, sodium chloride flush, sodium chloride flush   Vital Signs    Vitals:   12/02/18 1345 12/02/18 1426 12/02/18 2032 12/03/18 0455  BP: (!) 128/57  (!) 123/58 135/64  Pulse: 64  63 62  Resp: 13  13 13   Temp:  97.6 F (36.4 C) 98.1 F (36.7 C) 98.1 F (36.7 C)  TempSrc:  Oral Oral Oral  SpO2: 92%  96% 98%  Weight:      Height:        Intake/Output Summary (Last 24 hours) at 12/03/2018 0608 Last data filed at 12/03/2018 0456 Gross per 24 hour  Intake 2541.34 ml  Output 2075 ml  Net 466.34  ml   Filed Weights   11/29/18 0516 11/30/18 0437 12/01/18 0500  Weight: 105.1 kg 94.8 kg 94.2 kg    Telemetry    NSR, 1st degree heart block - Personally Reviewed  ECG    None today  Physical Exam   GEN: No acute distress.  Elderly male. Laying in bed.  Neck: No JVD Cardiac: RRR, systolic murmur, no rubs, or gallops.  Respiratory: Clear to auscultation bilaterally. GI: Soft, nontender, non-distended  MS: No edema; right BKA, left foot in bandages with wound vac in place Neuro:  Nonfocal  Psych: Normal affect   Labs    Chemistry Recent Labs  Lab 11/27/18 0509 11/30/18 0649 12/02/18 0828  NA 138 134* 134*  K 4.2 3.8 3.7  CL 106 100 103  CO2 23 22 21*  GLUCOSE 113* 134* 231*  BUN 28* 19 17  CREATININE 1.27* 1.25* 1.17  CALCIUM 8.4* 8.5* 8.5*  GFRNONAA 50* 51* 55*  GFRAA 58* 59* >60  ANIONGAP 9 12 10      Hematology Recent Labs  Lab 11/27/18 0929 12/02/18 0828  WBC 10.3 9.0  RBC 2.94* 3.22*  HGB 8.9* 10.0*  HCT 27.0* 29.1*  MCV 91.8 90.4  MCH 30.3 31.1  MCHC 33.0 34.4  RDW 15.0 16.0*  PLT 233 256    Cardiac EnzymesNo results for input(s): TROPONINI in the last 168 hours. No results for input(s): TROPIPOC in the last 168 hours.   BNPNo results for input(s): BNP, PROBNP in the last 168 hours.   DDimer No results for input(s): DDIMER in the last 168 hours.   Radiology    No results found.  Cardiac Studies    PV angiogram: 11/23/18  Procedures Performed: 1. Ultrasound-guided right common femoral access/contralateral access 2. Placement of an NAV6 distal protection device below the knee left popliteal artery 3. Diamondback orbital rotational atherectomy distal left SFA and popliteal artery 4. DCB distal left SFA/popliteal artery  Final Impression:Successful diamondback orbital rotational atherectomy, DCB using distal protection of a highly calcified subtotally occluded distal left  SFA and popliteal artery in the setting of critical limb ischemia. The dominant vessel below the knee is a peroneal. There does seem to be a subtotally occluded tibioperoneal trunk which could be percutaneously revascularizable. Because had use 160 cc did die and his creatinine clearance was in the 35 mL/h range I decided to terminate the procedure. He will need to be gently hydrated overnight and treated with antiplatelet therapy. He may ultimately really need a staged peroneal intervention.  [name], [facility]  PV angiogram: 11/25/18  Successful complex revascularization of the left TP trunk into the peroneal artery using 2 overlapping drug-eluting stents as well as drug coated balloon angioplasty of the distal left popliteal artery. The occlusion was crossed retrograde via the peroneal artery.  Recommendations: Continue dual antiplatelet therapy. Hydrate overnight as he is high risk for contrast-induced nephropathy. The patient received excessive amount of radiation and will be counseled.   Patient Profile     85 y.o. male with a history of HTN, HLD, PAF, OSA, CKD III, DM, and PVD s/p stent to Lt SFA 05/2010 after infection of right great toe and eventual BKA in 08/2018 who presented 11/18/2018 with worsening ischemic ulcer to left great toe with cellulitis  Assessment & Plan    1. PVV:ZSMOLMBEM in NSR, rate controlled. Denies any palpitations or chest pain. Was on eliquis at home.Holding his eliquis and heprain drip since 11/12. Orthopedics took for amputation on 12/02/18.  -Continue telemetry -Would recommend resuming eliquis if okay by Dr. Sharol Given  2. Critical limb ischemia of left great toe: S/p PV angiogram with successful arthrectomy to occluded left SFA and popliteal artery 11/23/18 with Dr. Gwenlyn Found. Staged intervention with Dr. Sophronia Simas 11/26/18. S/p left metatarsal amputation 12/02/18.  Is currently on DAPT with ASA and plavix, can stop ASA when eliquis is resumed. S/p  metatarsal amputation 12/02/18.No noted complications during surgery.  -Continue ASA and plavix -Discontinue ASA when eliquis resumed  3. CKD stage III: Cr has been stable, today it is 1.17  4. Moderate AS: Murmur noted on exam, echocardiogram 08/2018 showed mean gradient of 25 mmHg. Denies any light headedness or dizziness.   5. HTN: BP ranged 102/51-141/60. Currently on amlodipine 10 mg daily.   6. Anemia: Hgb today 10. No active bleeding noted. Continue to monitor.  For questions or updates, please contact [place] Please consult [email or web address] for contact info under Cardiology/STEMI.      Signed, Asencion Noble, MD  12/03/2018, 6:08 AM     Agree with note by Dr. Lonia Skinner.  Postop day #2 left TMA.  The wound is wrapped with a wound VAC.  He  is back on Eliquis for his PAF.  Exam benign.  Lungs are clear.  Does have an left ear murmur.  He is on p.o. diuretics.  Apparently he is being considered for CIR which I think is a great idea.  I will see him back in the office 2 to 3 weeks after discharge.  Lorretta Harp, M.D., [place], [facility], [place]ver[place] [address] [place], [place]  [id number]  [phone] 12/04/2018 9:24 AM

## 2018-12-03 NOTE — Progress Notes (Signed)
Alert awake. Said he had some pain a little while ago but has improved. C/O some heel pain  VSS afebrile . Dressing CDI VAC in place 0 cc in vac. Compartments soft  Elevated foot on pillow with heel pressure relief

## 2018-12-03 NOTE — Progress Notes (Signed)
ANTICOAGULATION CONSULT NOTE - Initial Consult  Pharmacy Consult for apixaban Indication: atrial fibrillation  Allergies  Allergen Reactions  . Iodinated Diagnostic Agents     Other reaction(s): Fever  . Dye Fdc Red [Red Dye] Hives  . Iodine-131 Other (See Comments)    Breakout, fever  . Iohexol      Code: RASH, Desc: PT STATES HOT ALL OVER HAD TO HAVE ICE PACKS ALL OVER HIM AND DIFF BREATHING, PT REFUSED IV DYE     Patient Measurements: Height: 5\' 10"  (177.8 cm) Weight: 207 lb 10.8 oz (94.2 kg) IBW/kg (Calculated) : 73  Vital Signs: Temp: 98.1 F (36.7 C) (11/19 0455) Temp Source: Oral (11/19 0455) BP: 135/64 (11/19 0455) Pulse Rate: 62 (11/19 0455)  Labs: Recent Labs    12/02/18 0828 12/03/18 0822  HGB 10.0* 10.0*  HCT 29.1* 29.7*  PLT 256 294  CREATININE 1.17 1.23    Estimated Creatinine Clearance: 46.9 mL/min (by C-G formula based on SCr of 1.23 mg/dL).   Medical History: Past Medical History:  Diagnosis Date  . Arthritis   . Borderline diabetic   . Diabetes mellitus without complication ([place])   . Glaucoma   . Hyperlipidemia   . Hypertension   . PVD (peripheral vascular disease) ([place])   . Sleep apnea    Cpap ordered but doesnt use    Assessment: 37 yoM admitted with L toe gangrene now s/p amputation 11/18. Pt is on apixaban PTA for hx AFib and PVD which was held for procedures. Upon review, pt started on apixaban 5mg  BID initially 07/2017 which was then reduced to 2.5mg  BID given labile Cr that was intermittently >1.5 (age >53y). SCr has been labile this admit as well, currently 1.23; however, given recent surgery will institute lower home dose for now and watch Cr.   Plan:  -Stop enoxaparin -Begin apixaban 2.5mg  BID -Follow Cr, may need to adjust back to 5mg  BID  Arrie Senate, PharmD, BCPS Clinical Pharmacist [phone] Please check AMION for all [place] numbers 12/03/2018

## 2018-12-03 NOTE — Progress Notes (Addendum)
PROGRESS NOTE    [name]  OTL:[id number] DOB: Apr 19, 1927 DOA: 11/18/2018 PCP: Redmond School, MD   Brief Narrative: 85 year old past medical history significant for hypertension, hyperlipidemia, diabetes type 2, chronic kidney disease a stage III, PE, A. fib, diastolic heart failure, PAD status post right BKA admitted from home with with black discoloration of first toe on the left foot.  X-ray show evidence of questionable osteomyelitis but reviewing final x-ray, no osteomyelitis was detected.  Patient was admitted on November the for for further management of dry gangrene and cellulitis of the left foot. Patient was a started on IV vancomycin and Zosyn.    Assessment & Plan:   Principal Problem:   Toe infection Active Problems:   Peripheral vascular disease (HCC)   Essential hypertension, benign   Hyperlipidemia   Aortic valve stenosis   Hypothyroidism   PAF (paroxysmal atrial fibrillation) (HCC)   Type 2 diabetes mellitus with foot ulcer, without long-term current use of insulin (HCC)   Gangrene of left foot (HCC)   Critical limb ischemia with history of revascularization of same extremity  1-First left toe with dry gangrene/ischemic ulcer and cellulitis in the setting of peripheral vascular disease with critical limb ischemia of the left great toe: Patient was a started on IV antibiotics. Blood cultures x2 no growth today. Status post successful complex revascularization of the left TP trunk into the peroneal artery using 2 overlapping drug-eluting stent as well as drug-coated balloon angioplasty of the distal left popliteal artery. Continue with Plavix.  Discussed with Dr Sharol Given ok to resume eliquis. Will stop aspirin as recommend by cardiology.  S/P  transmetatarsal amputation , 12/02/2018 Completed antibiotics. Patient currently requiring IV morphine for pain controlled.   2-Hypertension: Continue with Lasix and amlodipine  3-Hyperlipidemia: Continue Lipitor   4-Hypothyroidism: Continue with Synthroid  Chronic kidney disease a stage IIIb: Creatinine baseline 1.5--1.7.  Stable Lasix was resumed lower dose   Anemia: Questionable of delusional. Monitor hemoglobin. Check anemia panel. Hemoglobin stable at 10.  Diabetes type 2 with foot ulcer: Hold glipizide Continue sliding scale insulin  BPH: Continue Flomax and finasteride Pressure injury left heel stage III: Present on admission, local care.   Pressure Injury 09/10/18 Heel Left Stage II -  Partial thickness loss of dermis presenting as a shallow open ulcer with a red, pink wound bed without slough. was charted on 09/10/18 as a stage 1 under other wound column, now is a stage 2/open blister (Active)  09/10/18 1718  Location: Heel  Location Orientation: Left  Staging: Stage II -  Partial thickness loss of dermis presenting as a shallow open ulcer with a red, pink wound bed without slough.  Wound Description (Comments): was charted on 09/10/18 as a stage 1 under other wound column, now is a stage 2/open blister  Present on Admission: Yes     Estimated body mass index is 29.8 kg/m as calculated from the following:   Height as of this encounter: 5\' 10"  (1.778 m).   Weight as of this encounter: 94.2 kg.   DVT prophylaxis:  Code Status: DNR Family Communication: Care discussed with patient Disposition Plan: Plan for transmetatarsal amputation today. Consultants:   Dr. Sharol Given  Cardiology  Procedures:   Transmetatarsal amputation 11/18  Antimicrobials:    Subjective: He is complaining of foot pain post surgery, denies dyspnea, chest pain.   Objective: Vitals:   12/02/18 1345 12/02/18 1426 12/02/18 2032 12/03/18 0455  BP: (!) 128/57  (!) 123/58 135/64  Pulse: 64  63 62  Resp: 13  13 13   Temp:  97.6 F (36.4 C) 98.1 F (36.7 C) 98.1 F (36.7 C)  TempSrc:  Oral Oral Oral  SpO2: 92%  96% 98%  Weight:      Height:        Intake/Output Summary (Last 24 hours) at  12/03/2018 1225 Last data filed at 12/03/2018 0844 Gross per 24 hour  Intake 2301.34 ml  Output 1700 ml  Net 601.34 ml   Filed Weights   11/29/18 0516 11/30/18 0437 12/01/18 0500  Weight: 105.1 kg 94.8 kg 94.2 kg    Examination:  General exam: NAD Respiratory system: CTA Cardiovascular system: S 1, S 2 RRR Gastrointestinal system: BS present, soft, nt Central nervous system: alert, non focal.  Extremities: Symmetric power. Dressing in place left LE.    Data Reviewed: I have personally reviewed following labs and imaging studies  CBC: Recent Labs  Lab 11/27/18 0929 12/02/18 0828 12/03/18 0822  WBC 10.3 9.0 9.1  HGB 8.9* 10.0* 10.0*  HCT 27.0* 29.1* 29.7*  MCV 91.8 90.4 91.4  PLT 233 256 470   Basic Metabolic Panel: Recent Labs  Lab 11/27/18 0509 11/30/18 0649 12/02/18 0828 12/03/18 0822  NA 138 134* 134* 133*  K 4.2 3.8 3.7 3.8  CL 106 100 103 99  CO2 23 22 21* 24  GLUCOSE 113* 134* 231* 143*  BUN 28* 19 17 17   CREATININE 1.27* 1.25* 1.17 1.23  CALCIUM 8.4* 8.5* 8.5* 8.6*   GFR: Estimated Creatinine Clearance: 46.9 mL/min (by C-G formula based on SCr of 1.23 mg/dL). Liver Function Tests: No results for input(s): AST, ALT, ALKPHOS, BILITOT, PROT, ALBUMIN in the last 168 hours. No results for input(s): LIPASE, AMYLASE in the last 168 hours. No results for input(s): AMMONIA in the last 168 hours. Coagulation Profile: No results for input(s): INR, PROTIME in the last 168 hours. Cardiac Enzymes: No results for input(s): CKTOTAL, CKMB, CKMBINDEX, TROPONINI in the last 168 hours. BNP (last 3 results) No results for input(s): PROBNP in the last 8760 hours. HbA1C: No results for input(s): HGBA1C in the last 72 hours. CBG: Recent Labs  Lab 12/02/18 1220 12/02/18 1648 12/02/18 2307 12/03/18 0749 12/03/18 1128  GLUCAP 147* 129* 198* 115* 189*   Lipid Profile: No results for input(s): CHOL, HDL, LDLCALC, TRIG, CHOLHDL, LDLDIRECT in the last 72 hours.  Thyroid Function Tests: No results for input(s): TSH, T4TOTAL, FREET4, T3FREE, THYROIDAB in the last 72 hours. Anemia Panel: No results for input(s): VITAMINB12, FOLATE, FERRITIN, TIBC, IRON, RETICCTPCT in the last 72 hours. Sepsis Labs: No results for input(s): PROCALCITON, LATICACIDVEN in the last 168 hours.  No results found for this or any previous visit (from the past 240 hour(s)).       Radiology Studies: No results found.      Scheduled Meds: . amLODipine  10 mg Oral Daily  . aspirin EC  81 mg Oral Daily  . atorvastatin  40 mg Oral Daily  . clopidogrel  75 mg Oral Q breakfast  . collagenase   Topical Daily  . dorzolamide-timolol  1 drop Both Eyes BID  . enoxaparin (LOVENOX) injection  40 mg Subcutaneous Daily  . famotidine  20 mg Oral Daily  . finasteride  5 mg Oral QPM  . furosemide  20 mg Oral Daily  . insulin aspart  0-15 Units Subcutaneous TID WC  . insulin aspart  0-5 Units Subcutaneous QHS  . levothyroxine  50 mcg Oral Q0600  . potassium  chloride  10 mEq Oral Daily  . senna  1 tablet Oral BID  . sodium chloride flush  10-40 mL Intracatheter Q12H  . sodium chloride flush  3 mL Intravenous Q12H  . sodium chloride flush  3 mL Intravenous Q12H  . sodium chloride flush  3 mL Intravenous Q12H  . tamsulosin  0.4 mg Oral BID   Continuous Infusions: . sodium chloride 250 mL (11/26/18 1031)  . sodium chloride       LOS: 14 days    Time spent: 35 minutes./     Twilia Yaklin A Murriel Eidem, MD [facility]   If 7PM-7AM, please contact night-coverage [email or web address] Password [facility] 12/03/2018, 12:25 PM

## 2018-12-03 NOTE — Evaluation (Signed)
Occupational Therapy Evaluation Patient Details Name: [name] MRN: [id number] DOB: [birth year] Today's Date: 12/03/2018    History of Present Illness 85 year old past medical history significant for HTN, hyperlipidemia, diabetes type 2, chronic kidney disease a stage III, PE, A. fib, diastolic heart failure, right BKA admitted from home with with black discoloration of first toe on the left foot.  Now s/p L transmetatarsal amputation of L foot   Clinical Impression   Pt from home, has great 24/7 family support. He was transferring using a mechanical lift, and getting assist for bathing/dressing. Pt was able to assist with rolling in bed, max A for peri care. Maxi move used to transfer patient to recliner where he participated in simple trunk exercises and balance exercises, in addition to set up grooming. Son entering at end of session. Family and Pt are eager to progress and achieve transfers without lift support. At this time recommending CIR post-acute for working with patient and family to achieve these goals.     Follow Up Recommendations  CIR;Supervision/Assistance - 24 hour    Equipment Recommendations  Other (comment)(defer to next venue)    Recommendations for Other Services Rehab consult     Precautions / Restrictions Precautions Precautions: Fall Precaution Comments: wound vac L foot Restrictions Weight Bearing Restrictions: Yes LLE Weight Bearing: Non weight bearing      Mobility Bed Mobility Overal bed mobility: Needs Assistance Bed Mobility: Rolling Rolling: Mod assist         General bed mobility comments: cues for sequencing and assist for hips with bed pad  Transfers Overall transfer level: Needs assistance Equipment used: Ambulation equipment used             General transfer comment: +2 for safety with lift    Balance Overall balance assessment: Needs assistance   Sitting balance-Leahy Scale: Fair Sitting balance - Comments: in  recliner, able to bring trunk forwards and backwards without assist                                   ADL either performed or assessed with clinical judgement   ADL Overall ADL's : Needs assistance/impaired Eating/Feeding: Supervision/ safety;Sitting   Grooming: Set up;Sitting   Upper Body Bathing: Moderate assistance   Lower Body Bathing: Maximal assistance   Upper Body Dressing : Maximal assistance   Lower Body Dressing: Maximal assistance   Toilet Transfer: Maximal assistance;+2 for safety/equipment Toilet Transfer Details (indicate cue type and reason): currently using condom cath and bed pan, assist with rolling for bed pan Toileting- Clothing Manipulation and Hygiene: Maximal assistance;+2 for safety/equipment;Bed level Toileting - Clothing Manipulation Details (indicate cue type and reason): Pt assists with rolling for peri care             Vision Baseline Vision/History: Wears glasses Wears Glasses: At all times Patient Visual Report: No change from baseline       Perception     Praxis      Pertinent Vitals/Pain Pain Assessment: Faces Faces Pain Scale: Hurts little more Pain Location: L foot Pain Descriptors / Indicators: Grimacing Pain Intervention(s): Limited activity within patient's tolerance;Monitored during session;Repositioned     Hand Dominance Right   Extremity/Trunk Assessment Upper Extremity Assessment Upper Extremity Assessment: Overall WFL for tasks assessed   Lower Extremity Assessment Lower Extremity Assessment: Defer to PT evaluation   Cervical / Trunk Assessment Cervical / Trunk Assessment: Normal   Communication Communication  Communication: HOH   Cognition Arousal/Alertness: Awake/alert Behavior During Therapy: WFL for tasks assessed/performed Overall Cognitive Status: Within Functional Limits for tasks assessed                                     General Comments       Exercises      Shoulder Instructions      Home Living Family/patient expects to be discharged to:: Inpatient rehab Living Arrangements: Children Available Help at Discharge: Family;Available 24 hours/day Type of Home: House Home Access: Ramped entrance     Home Layout: One level     Bathroom Shower/Tub: Occupational psychologist: Standard     Home Equipment: Environmental consultant - 2 wheels;Wheelchair - [employer];Hospital bed   Additional Comments: hoyer lift      Prior Functioning/Environment Level of Independence: Needs assistance  Gait / Transfers Assistance Needed: family using lift at home ADL's / Homemaking Assistance Needed: family assists bathing and dressing Communication / Swallowing Assistance Needed: HOH          OT Problem List: Decreased activity tolerance;Impaired balance (sitting and/or standing);Decreased knowledge of precautions;Pain      OT Treatment/Interventions: Self-care/ADL training;Therapeutic exercise;DME and/or AE instruction;Therapeutic activities;Patient/family education;Balance training    OT Goals(Current goals can be found in the care plan section) Acute Rehab OT Goals Patient Stated Goal: Get to CIR and maximize independence OT Goal Formulation: With patient/family Time For Goal Achievement: 12/17/18 Potential to Achieve Goals: Good  OT Frequency: Min 2X/week   Barriers to D/C:            Co-evaluation PT/OT/SLP Co-Evaluation/Treatment: Yes Reason for Co-Treatment: For patient/therapist safety;To address functional/ADL transfers   OT goals addressed during session: ADL's and self-care;Strengthening/ROM      AM-PAC OT "6 Clicks" Daily Activity     Outcome Measure Help from another person eating meals?: None Help from another person taking care of personal grooming?: A Little Help from another person toileting, which includes using toliet, bedpan, or urinal?: A Lot Help from another person bathing (including washing, rinsing,  drying)?: A Lot Help from another person to put on and taking off regular upper body clothing?: A Lot Help from another person to put on and taking off regular lower body clothing?: Total 6 Click Score: 14   End of Session Equipment Utilized During Treatment: Other (comment)(maximove) Nurse Communication: Mobility status;Need for lift equipment  Activity Tolerance: Patient tolerated treatment well Patient left: in chair;with chair alarm set  OT Visit Diagnosis: Other abnormalities of gait and mobility (R26.89);Muscle weakness (generalized) (M62.81);Pain Pain - Right/Left: Left Pain - part of body: Ankle and joints of foot                Time: 5625-6389 OT Time Calculation (min): 33 min Charges:  OT General Charges $OT Visit: 1 Visit OT Evaluation $OT Eval Moderate Complexity: [place] OTR/L Acute Rehabilitation Services Pager: [phone] Office: [place] 12/03/2018, 11:28 AM

## 2018-12-03 NOTE — Progress Notes (Signed)
Inpatient Rehabilitation Admissions Coordinator  I await updated postop therapy assessment to begin insurance authorization for a possible inpt rehab admit when medically ready. I have notified Therapists of need.  Danne Baxter, RN, MSN Rehab Admissions Coordinator [phone] 12/03/2018 10:40 AM

## 2018-12-04 LAB — BASIC METABOLIC PANEL
Anion gap: 10 (ref 5–15)
BUN: 18 mg/dL (ref 8–23)
CO2: 24 mmol/L (ref 22–32)
Calcium: 9 mg/dL (ref 8.9–10.3)
Chloride: 100 mmol/L (ref 98–111)
Creatinine, Ser: 1.29 mg/dL — ABNORMAL HIGH (ref 0.61–1.24)
GFR calc Af Amer: 57 mL/min — ABNORMAL LOW (ref >60)
GFR calc non Af Amer: 49 mL/min — ABNORMAL LOW (ref >60)
Glucose, Bld: 130 mg/dL — ABNORMAL HIGH (ref 70–99)
Potassium: 4.2 mmol/L (ref 3.5–5.1)
Sodium: 134 mmol/L — ABNORMAL LOW (ref 135–145)

## 2018-12-04 LAB — CBC
HCT: 30.1 % — ABNORMAL LOW (ref 39.0–52.0)
Hemoglobin: 9.9 g/dL — ABNORMAL LOW (ref 13.0–17.0)
MCH: 30.6 pg (ref 26.0–34.0)
MCHC: 32.9 g/dL (ref 30.0–36.0)
MCV: 92.9 fL (ref 80.0–100.0)
Platelets: 278 10*3/uL (ref 150–400)
RBC: 3.24 MIL/uL — ABNORMAL LOW (ref 4.22–5.81)
RDW: 16 % — ABNORMAL HIGH (ref 11.5–15.5)
WBC: 8.8 10*3/uL (ref 4.0–10.5)
nRBC: 0 % (ref 0.0–0.2)

## 2018-12-04 LAB — RETICULOCYTES
Immature Retic Fract: 12.7 % (ref 2.3–15.9)
RBC.: 3.24 MIL/uL — ABNORMAL LOW (ref 4.22–5.81)
Retic Count, Absolute: 94 10*3/uL (ref 19.0–186.0)
Retic Ct Pct: 2.9 % (ref 0.4–3.1)

## 2018-12-04 LAB — FOLATE: Folate: 11.9 ng/mL (ref >5.9)

## 2018-12-04 LAB — GLUCOSE, CAPILLARY
Glucose-Capillary: 118 mg/dL — ABNORMAL HIGH (ref 70–99)
Glucose-Capillary: 163 mg/dL — ABNORMAL HIGH (ref 70–99)
Glucose-Capillary: 130 mg/dL — ABNORMAL HIGH (ref 70–99)
Glucose-Capillary: 147 mg/dL — ABNORMAL HIGH (ref 70–99)

## 2018-12-04 LAB — SARS CORONAVIRUS 2 (TAT 6-24 HRS): SARS Coronavirus 2: NEGATIVE

## 2018-12-04 LAB — IRON AND TIBC
Iron: 37 ug/dL — ABNORMAL LOW (ref 45–182)
Saturation Ratios: 17 % — ABNORMAL LOW (ref 17.9–39.5)
TIBC: 217 ug/dL — ABNORMAL LOW (ref 250–450)
UIBC: 180 ug/dL

## 2018-12-04 LAB — VITAMIN B12: Vitamin B-12: 292 pg/mL (ref 180–914)

## 2018-12-04 LAB — FERRITIN: Ferritin: 269 ng/mL (ref 24–336)

## 2018-12-04 MED ORDER — CYANOCOBALAMIN 1000 MCG/ML IJ SOLN
1000.0000 ug | Freq: Once | INTRAMUSCULAR | Status: AC
  Administered 2018-12-04: 1000 ug via INTRAMUSCULAR
  Filled 2018-12-04: qty 1

## 2018-12-04 MED ORDER — MORPHINE SULFATE (PF) 2 MG/ML IV SOLN
0.5000 mg | INTRAVENOUS | Status: DC | PRN
  Administered 2018-12-04: 0.5 mg via INTRAVENOUS
  Filled 2018-12-04: qty 1

## 2018-12-04 MED ORDER — ACETAMINOPHEN 500 MG PO TABS
500.0000 mg | ORAL_TABLET | Freq: Three times a day (TID) | ORAL | Status: DC
  Administered 2018-12-04 – 2018-12-09 (×15): 500 mg via ORAL
  Filled 2018-12-04 (×16): qty 1

## 2018-12-04 MED ORDER — MORPHINE SULFATE (PF) 2 MG/ML IV SOLN
1.0000 mg | INTRAVENOUS | Status: DC | PRN
  Administered 2018-12-04 – 2018-12-09 (×10): 1 mg via INTRAVENOUS
  Filled 2018-12-04 (×11): qty 1

## 2018-12-04 MED ORDER — POLYETHYLENE GLYCOL 3350 17 G PO PACK
17.0000 g | PACK | Freq: Two times a day (BID) | ORAL | Status: DC
  Administered 2018-12-04 – 2018-12-07 (×3): 17 g via ORAL
  Filled 2018-12-04 (×5): qty 1

## 2018-12-04 MED ORDER — BISACODYL 10 MG RE SUPP
10.0000 mg | Freq: Once | RECTAL | Status: AC
  Administered 2018-12-04: 10 mg via RECTAL
  Filled 2018-12-04: qty 1

## 2018-12-04 MED ORDER — APIXABAN 5 MG PO TABS
5.0000 mg | ORAL_TABLET | Freq: Two times a day (BID) | ORAL | Status: DC
  Administered 2018-12-04 – 2018-12-09 (×10): 5 mg via ORAL
  Filled 2018-12-04 (×10): qty 1

## 2018-12-04 MED ORDER — TRAMADOL HCL 50 MG PO TABS
25.0000 mg | ORAL_TABLET | Freq: Four times a day (QID) | ORAL | Status: DC | PRN
  Administered 2018-12-04 – 2018-12-07 (×5): 25 mg via ORAL
  Filled 2018-12-04 (×6): qty 1

## 2018-12-04 MED ORDER — POLYSACCHARIDE IRON COMPLEX 150 MG PO CAPS
150.0000 mg | ORAL_CAPSULE | Freq: Every day | ORAL | Status: DC
  Administered 2018-12-04 – 2018-12-09 (×6): 150 mg via ORAL
  Filled 2018-12-04 (×6): qty 1

## 2018-12-04 NOTE — Progress Notes (Addendum)
VSS VAC 50 cc . Compartments soft.   OK to resume Eliquis. Will need 1 week follow up DrMarland Kitchen Sharol Given

## 2018-12-04 NOTE — Progress Notes (Addendum)
ANTICOAGULATION CONSULT NOTE - Initial Consult  Pharmacy Consult for apixaban Indication: atrial fibrillation  Allergies  Allergen Reactions  . Iodinated Diagnostic Agents     Other reaction(s): Fever  . Dye Fdc Red [Red Dye] Hives  . Iodine-131 Other (See Comments)    Breakout, fever  . Iohexol      Code: RASH, Desc: PT STATES HOT ALL OVER HAD TO HAVE ICE PACKS ALL OVER HIM AND DIFF BREATHING, PT REFUSED IV DYE     Patient Measurements: Height: 5\' 10"  (177.8 cm) Weight: 214 lb 4.6 oz (97.2 kg) IBW/kg (Calculated) : 73  Vital Signs: Temp: 98 F (36.7 C) (11/20 0438) Temp Source: Oral (11/20 0438) BP: 145/61 (11/20 0438) Pulse Rate: 60 (11/20 0438)  Labs: Recent Labs    12/02/18 0828 12/03/18 0822 12/04/18 0341  HGB 10.0* 10.0* 9.9*  HCT 29.1* 29.7* 30.1*  PLT 256 294 278  CREATININE 1.17 1.23 1.29*    Estimated Creatinine Clearance: 45.4 mL/min (A) (by C-G formula based on SCr of 1.29 mg/dL (H)).   Medical History: Past Medical History:  Diagnosis Date  . Arthritis   . Borderline diabetic   . Diabetes mellitus without complication ([place])   . Glaucoma   . Hyperlipidemia   . Hypertension   . PVD (peripheral vascular disease) ([place])   . Sleep apnea    Cpap ordered but doesnt use    Assessment: 18 yoM admitted with L toe gangrene now s/p amputation 11/18. Pt is on apixaban PTA for hx AFib and PVD, pt started on apixaban 5mg  BID initially 07/2017 which was then reduced to 2.5mg  BID given labile Cr that was intermittently >1.5 (age >66y). Scr has been stable since prior to surgery.  Plan:  Restart apixaban at 5 mg PO BID     Thank you for allowing pharmacy to participate in this patient's care.  Shonette Rhames L. Devin Going, [place] PGY1 Pharmacy Resident [phone] 12/04/18      7:45 AM  Please check AMION for all Pax phone numbers After 10:00 PM, call the [place] [phone]

## 2018-12-04 NOTE — TOC Progression Note (Signed)
Transition of Care [facility]) - Progression Note    Patient Details  Name: [name] MRN: [id number] Date of Birth: May 09, 1927  [facility]) CM/SW Contact  Graves-Bigelow, Ocie Cornfield, RN Phone Number: 12/04/2018, 12:22 PM  Clinical Narrative: CIR following the patient for possible insurance approval for CIR-Admissions Coordinator submitting clinical to [facility] for possible authorization. CM will continue to follow for transition of care needs.   Expected Discharge Plan: [place] Barriers to Discharge: Continued Medical Work up  Expected Discharge Plan and Services Expected Discharge Plan: [place] In-house Referral: NA Discharge Planning Services: CM Consult Post Acute Care Choice: Home Health, Resumption of Svcs/PTA Provider(Active with [facility]) Living arrangements for the past 2 months: [place]: RN [place] Agency: [place] ([place]) Date [place]: 11/26/18 Time [place]: 14 Representative spoke with at [place]: [place] ([place]) Interventions    Readmission Risk Interventions Readmission Risk Prevention Plan 11/26/2018 11/21/2018 11/12/2018  Transportation Screening Complete Complete -  PCP or Specialist Appt within 3-5 Days - - -  Not Complete comments - - -  HRI or [place] or Home Care Consult comments - - -  Social Work Scientific laboratory technician for [place] Planning/Counseling - - -  Palliative Care Screening - - -  Medication Review Press photographer) Complete Referral to Pharmacy -  PCP or Specialist appointment within 3-5 days of discharge Complete Complete Complete  HRI or Home Care Consult Complete Complete -  SW Recovery Care/Counseling Consult Complete Complete -  Palliative Care Screening Not Applicable Not Applicable -  [place] Not Applicable Not Complete Not Applicable  Some recent  data might be hidden

## 2018-12-04 NOTE — H&P (Signed)
Physical Medicine and Rehabilitation Admission H&P    Chief Complaint  Patient presents with   Functional decline due to left transmetatarsal amputation.    Recent R-BKA  : HPI: [name] is a 85 year old right-handed male well-known to rehab services with CIR admission 09/07/2018 until 09/22/2018 for right BKA secondary to peripheral vascular disease--was discharged home requiring minimal assist for mobility as well as history of diabetes mellitus, recent admission for diastolic congestive heart failure, CKD stage III, PAF --on Eliquis (Dr. Debara Pickett), hypertension, hyperlipidemia.  Per chart review patient lives with family with assistance as needed 1 level home with ramped entrance.  Family was using a lift to help aid in transfers at home.  Presented 11/18/2018 with black discoloration of first toe left foot.  Patient had been followed at the wound care center.  Noted progressive redness of the dorsum of the left foot as well as skin ulcer 2 to 3 mm on the lateral aspect of the left foot.  X-rays revealed no acute osseous abnormality in the left foot.  Admission chemistries with sodium 132, BUN 31, creatinine 1.27, hemoglobin 11.5, WBC 10,800, lactic acid within normal limits 1.1, sedimentation rate elevated at 84 and BC negative.  He was started on IV vancomycin and Zosyn.    Dr. Sharol Given consulted for input and felt that foot not salvageable.  He received cardiac clearance and underwent left transmetatarsal amputation with placement of wound VAC on 12/02/2018 per Dr. Sharol Given. Postoperative Eliquis and Plavix resumed and low-dose aspirin was discontinued after discussing with cardiology services.  Acute blood loss anemia being monitored. He did develop leucocytosis with cough and CXR 11/24 showed progressive CHF/PNA. He was treated with IV diuresis 11/24 and was started on broad spectrum antibiotics due to concerns of PNA. Diarrhea improving off laxatives. Of note, patient did have contact with sister  11/15 and she tested COVID positive on 11/18--> he was tested on 11/20 and is COVID negative.    To be NWB LLE. Therapy ongoing and patient cleared for comprehensive rehab program.   Review of Systems  Constitutional: Negative for chills and fever.  HENT: Negative for hearing loss.   Eyes: Negative for blurred vision and double vision.  Respiratory: Negative for cough and shortness of breath.   Cardiovascular: Positive for palpitations and leg swelling. Negative for chest pain.  Gastrointestinal: Positive for constipation. Negative for heartburn, nausea and vomiting.  Genitourinary: Positive for urgency. Negative for dysuria, flank pain and hematuria.  Musculoskeletal: Positive for joint pain and myalgias.  Skin: Negative for rash.  All other systems reviewed and are negative.   Past Medical History:  Diagnosis Date   Arthritis    Borderline diabetic    Diabetes mellitus without complication ([facility])    Glaucoma    Hyperlipidemia    Hypertension    PVD (peripheral vascular disease) ([place])    Sleep apnea    Cpap ordered but doesnt use    Past Surgical History:  Procedure Laterality Date   ABDOMINAL AORTOGRAM W/LOWER EXTREMITY N/A 07/27/2018   Procedure: ABDOMINAL AORTOGRAM W/LOWER EXTREMITY;  Surgeon: Lorretta Harp, MD;  Location: [facility];  Service: Cardiovascular;  Laterality: N/A;   AMPUTATION Right 08/28/2018   Procedure: RIGHT BELOW KNEE AMPUTATION;  Surgeon: [name], MD;  Location: [facility];  Service: Orthopedics;  Laterality: Right;   AMPUTATION Left 12/02/2018   Procedure: LEFT TRANSMETATARSAL AMPUTATION AND NEGATIVE PRESSURE WOUND VAC PLACEMENT;  Surgeon: [name], MD;  Location: [facility];  Service: Orthopedics;  Laterality: Left;   APPENDECTOMY  1943   CATARACT EXTRACTION  2012   x2   COLONOSCOPY N/A 11/01/2014   Procedure: COLONOSCOPY;  Surgeon: Aviva Signs, MD;  [facility];  Service: Gastroenterology;  Laterality: N/A;    ESOPHAGOGASTRODUODENOSCOPY N/A 11/01/2014   Procedure: ESOPHAGOGASTRODUODENOSCOPY (EGD);  Surgeon: Aviva Signs, MD;  [facility];  Service: Gastroenterology;  Laterality: N/A;   HERNIA REPAIR Left    INGUINAL HERNIA REPAIR Right 03/01/2013   Procedure: RIGHT INGUINAL HERNIORRHAPHY;  Surgeon: Jamesetta So, MD;  Location: [facility];  Service: General;  Laterality: Right;   INSERTION OF MESH Right 03/01/2013   Procedure: INSERTION OF MESH;  Surgeon: Jamesetta So, MD;  Location[facility];  Service: General;  Laterality: Right;   LOWER EXTREMITY ANGIOGRAPHY Right 08/25/2018   Procedure: LOWER EXTREMITY ANGIOGRAPHY;  Surgeon: Wellington Hampshire, MD;  Location: [facility];  Service: Cardiovascular;  Laterality: Right;   LOWER EXTREMITY ANGIOGRAPHY N/A 11/25/2018   Procedure: LOWER EXTREMITY ANGIOGRAPHY;  Surgeon: Wellington Hampshire, MD;  Location: [facility];  Service: Cardiovascular;  Laterality: N/A;   NM MYOCAR PERF WALL MOTION  06/05/2010   no significant ischemia   PERIPHERAL VASCULAR ATHERECTOMY Right 08/03/2018   Procedure: PERIPHERAL VASCULAR ATHERECTOMY;  Surgeon: Lorretta Harp, MD;  Location: [facility];  Service: Cardiovascular;  Laterality: Right;   PERIPHERAL VASCULAR ATHERECTOMY Left 11/23/2018   Procedure: PERIPHERAL VASCULAR ATHERECTOMY;  Surgeon: Lorretta Harp, MD;  Location: [facility];  Service: Cardiovascular;  Laterality: Left;  sfa with dc balloon   PERIPHERAL VASCULAR BALLOON ANGIOPLASTY Left 11/25/2018   Procedure: PERIPHERAL VASCULAR BALLOON ANGIOPLASTY;  Surgeon: Wellington Hampshire, MD;  Location: [facility];  Service: Cardiovascular;  Laterality: Left;  popliteal   PERIPHERAL VASCULAR INTERVENTION Left 11/25/2018   Procedure: PERIPHERAL VASCULAR INTERVENTION;  Surgeon: Wellington Hampshire, MD;  Location: [facility];  Service: Cardiovascular;  Laterality: Left;  tibial peroneal trunk and peroneal stents     stent  06/14/2010   left leg   US ECHOCARDIOGRAPHY  01/21/2006   moderate mitral annular ca+, mild MR, AOV moderately sclerotic.    Family History  Problem Relation Age of Onset   Cancer Mother    Heart attack Father    Stroke Father    Heart attack Brother    Heart attack Brother     Social History:  Married. Wheelchair bound and needed assistance with ADLs and mobility. Per reports that he quit smoking about 53 years ago. He has quit using smokeless tobacco.  His smokeless tobacco use included chew. He reports that he does not drink alcohol or use drugs.    Allergies  Allergen Reactions   Iodinated Diagnostic Agents     Other reaction(s): Fever   Dye Fdc Red [Red Dye] Hives   Iodine-131 Other (See Comments)    Breakout, fever   Iohexol      Code: RASH, Desc: PT STATES HOT ALL OVER HAD TO HAVE ICE PACKS ALL OVER HIM AND DIFF BREATHING, PT REFUSED IV DYE    Medications Prior to Admission  Medication Sig Dispense Refill   acetaminophen (TYLENOL) 325 MG tablet Take 1-2 tablets (325-650 mg total) by mouth every 6 (six) hours as needed for mild pain (pain score 1-3 or temp > 100.5).     apixaban (ELIQUIS) 2.5 MG TABS tablet Take 1 tablet (2.5 mg total) by mouth  2 (two) times daily. 180 tablet 3   aspirin EC 81 MG tablet Take 81 mg by mouth daily.     atorvastatin (LIPITOR) 40 MG tablet Take 1 tablet (40 mg total) by mouth daily. 90 tablet 3   dorzolamide-timolol (COSOPT) 22.3-6.8 MG/ML ophthalmic solution Place 1 drop into both eyes 2 (two) times daily.     doxazosin (CARDURA) 2 MG tablet Take 1 tablet (2 mg total) by mouth at bedtime. 90 tablet 3   famotidine (PEPCID) 20 MG tablet Take 1 tablet (20 mg total) by mouth daily. 60 tablet 0   finasteride (PROSCAR) 5 MG tablet Take 1 tablet (5 mg total) by mouth every evening. 30 tablet 0   furosemide (LASIX) 40 MG tablet Take 1 tablet (40 mg total) by mouth 2 (two) times daily. 30 tablet 1   glipiZIDE (GLUCOTROL) 5  MG tablet Take 5 mg by mouth 2 (two) times daily.      levothyroxine (SYNTHROID) 50 MCG tablet Take 1 tablet (50 mcg total) by mouth daily before breakfast. 30 tablet 0   Omega-3 Fatty Acids (FISH OIL PO) Take 1 capsule by mouth daily.     potassium chloride (KLOR-CON) 10 MEQ tablet Take 1 tablet (10 mEq total) by mouth daily. 30 tablet 1   povidone-Iodine (BETADINE) 5 % SOLN topical solution Apply 1 application topically daily as needed for wound care.     SANTYL ointment Apply 1 application topically daily.      tamsulosin (FLOMAX) 0.4 MG CAPS capsule Take 1 capsule (0.4 mg total) by mouth 2 (two) times daily. 60 capsule 0   vitamin C (ASCORBIC ACID) 500 MG tablet Take 500 mg by mouth daily.     vitamin E 400 UNIT capsule Take 400 Units by mouth daily.      Drug Regimen Review Drug regimen was reviewed and remains appropriate with no significant issues identified  Home: Home Living Family/patient expects to be discharged to:: Inpatient rehab Living Arrangements: (daughter and son providng 24/7 assist Daughter weekends) Available Help at Discharge: Family, Available 24 hours/day Type of Home: House Home Access: Ramped entrance Home Layout: One level Bathroom Shower/Tub: Multimedia programmer: Standard Bathroom Accessibility: Yes Home Equipment: Environmental consultant - 2 wheels, Wheelchair - manual, Bedside commode, Hospital bed Additional Comments: hoyer lift  Lives With: Alone(with family providng 24/7 assist)   Functional History: Prior Function Level of Independence: Needs assistance Gait / Transfers Assistance Needed: family using lift at home ADL's / Homemaking Assistance Needed: family assists bathing and dressing Communication / Swallowing Assistance Needed: HOH Comments: patietn wheelchair level since 02/2018  Functional Status:  Mobility: Bed Mobility Overal bed mobility: Needs Assistance Bed Mobility: Rolling Rolling: Mod assist, +2 for physical  assistance Supine to sit: Max assist, HOB elevated Sit to supine: Total assist, +2 for physical assistance General bed mobility comments: MOD A +2 for rolling to position lift pad; able to initiate but required MOD A to maintain position Transfers Overall transfer level: Needs assistance Equipment used: Ambulation equipment used Transfer via Lift Equipment: Maximove Transfers: Sit to/from Stand, Lateral/Scoot Transfers Sit to Stand: Total assist  Lateral/Scoot Transfers: Max assist General transfer comment: total A +2 for maxi move lift to chair Ambulation/Gait General Gait Details: not ambulatory yet since amp of R LE    ADL: ADL Overall ADL's : Needs assistance/impaired Eating/Feeding: Supervision/ safety, Sitting Grooming: Oral care, Sitting, Supervision/safety, Set up Upper Body Bathing: Moderate assistance Lower Body Bathing: Maximal assistance Upper Body Dressing : Maximal assistance  Lower Body Dressing: Maximal assistance Toilet Transfer: +2 for safety/equipment, Total assistance Toilet Transfer Details (indicate cue type and reason): currently using condom cath and bed pan, assist with rolling for bed pan; total A +2 for simulated toilet tranfer to recliner with [place] and Hygiene: Maximal assistance, +2 for safety/equipment, Bed level Toileting - Clothing Manipulation Details (indicate cue type and reason): Pt assists with rolling for peri care General ADL Comments: session focus on seated grooming in chair; set-up/ supervision assist needed  Cognition: Cognition Overall Cognitive Status: Within Functional Limits for tasks assessed Orientation Level: Oriented X4 Cognition Arousal/Alertness: Awake/alert Behavior During Therapy: WFL for tasks assessed/performed Overall Cognitive Status: Within Functional Limits for tasks assessed  Physical Exam: Blood pressure 137/60, pulse 64, temperature 98.5 F (36.9 C), temperature source Oral,  resp. rate 15, height 5\' 10"  (1.778 m), weight 94.6 kg, SpO2 96 %. Physical Exam  Gen:in pain, frail HEENT: oral mucosa pink and moist, NCAT Cardio: Reg rate Chest: normal effort, normal rate of breathing Abd: soft, non-distended Ext: no edema Skin: Right BKA well-healed.  Left transmetatarsal amputation site dressed with wound VAC in place.  Appropriately tender  Neuro: Patient resting comfortably.  Make good eye contact with examiner.  He is a bit confused but was able to provide his name and age with some delay in processing.   Musculoskeletal: 4/5 in upper extremities bilaterally. Patient is unable to participate in lower extremity testing as is currently in pain from wound vac dressing change. Psych: pleasant, normal affect   Results for orders placed or performed during the hospital encounter of 11/18/18 (from the past 48 hour(s))  Glucose, capillary     Status: Abnormal   Collection Time: 12/07/18  4:28 PM  Result Value Ref Range   Glucose-Capillary 180 (H) 70 - 99 mg/dL   Comment 1 Notify RN    Comment 2 Document in Chart   Glucose, capillary     Status: Abnormal   Collection Time: 12/07/18  9:21 PM  Result Value Ref Range   Glucose-Capillary 216 (H) 70 - 99 mg/dL  CBC     Status: Abnormal   Collection Time: 12/08/18  3:53 AM  Result Value Ref Range   WBC 13.1 (H) 4.0 - 10.5 K/uL   RBC 3.03 (L) 4.22 - 5.81 MIL/uL   Hemoglobin 9.2 (L) 13.0 - 17.0 g/dL   HCT 27.4 (L) 39.0 - 52.0 %   MCV 90.4 80.0 - 100.0 fL   MCH 30.4 26.0 - 34.0 pg   MCHC 33.6 30.0 - 36.0 g/dL   RDW 16.2 (H) 11.5 - 15.5 %   Platelets 246 150 - 400 K/uL   nRBC 0.0 0.0 - 0.2 %    Comment: Performed at [facility], [address] [place], [place] [address]  Glucose, capillary     Status: Abnormal   Collection Time: 12/08/18  7:44 AM  Result Value Ref Range   Glucose-Capillary 127 (H) 70 - 99 mg/dL  Glucose, capillary     Status: Abnormal   Collection Time: 12/08/18 11:39 AM  Result Value Ref  Range   Glucose-Capillary 185 (H) 70 - 99 mg/dL  Urinalysis, Routine w reflex microscopic     Status: Abnormal   Collection Time: 12/08/18  3:00 PM  Result Value Ref Range   Color, Urine YELLOW YELLOW   APPearance HAZY (A) CLEAR   Specific Gravity, Urine 1.010 1.005 - 1.030   pH 6.0 5.0 - 8.0  Glucose, UA NEGATIVE NEGATIVE mg/dL   Hgb urine dipstick NEGATIVE NEGATIVE   Bilirubin Urine NEGATIVE NEGATIVE   Ketones, ur NEGATIVE NEGATIVE mg/dL   Protein, ur NEGATIVE NEGATIVE mg/dL   Nitrite NEGATIVE NEGATIVE   Leukocytes,Ua MODERATE (A) NEGATIVE   RBC / HPF 0-5 0 - 5 RBC/hpf   WBC, UA 6-10 0 - 5 WBC/hpf   Bacteria, UA RARE (A) NONE SEEN   Squamous Epithelial / LPF 0-5 0 - 5   Mucus PRESENT    Budding Yeast PRESENT     Comment: Performed at [facility], [place] [address] [place], [place] [address]  Glucose, capillary     Status: Abnormal   Collection Time: 12/08/18  3:54 PM  Result Value Ref Range   Glucose-Capillary 218 (H) 70 - 99 mg/dL  Glucose, capillary     Status: Abnormal   Collection Time: 12/08/18  9:44 PM  Result Value Ref Range   Glucose-Capillary 187 (H) 70 - 99 mg/dL  Glucose, capillary     Status: Abnormal   Collection Time: 12/08/18 11:40 PM  Result Value Ref Range   Glucose-Capillary 153 (H) 70 - 99 mg/dL  CBC     Status: Abnormal   Collection Time: 12/09/18  3:43 AM  Result Value Ref Range   WBC 12.7 (H) 4.0 - 10.5 K/uL   RBC 3.02 (L) 4.22 - 5.81 MIL/uL   Hemoglobin 9.3 (L) 13.0 - 17.0 g/dL   HCT 27.7 (L) 39.0 - 52.0 %   MCV 91.7 80.0 - 100.0 fL   MCH 30.8 26.0 - 34.0 pg   MCHC 33.6 30.0 - 36.0 g/dL   RDW 15.9 (H) 11.5 - 15.5 %   Platelets 221 150 - 400 K/uL   nRBC 0.0 0.0 - 0.2 %    Comment: Performed at [facility], [place] [address] [place], [place] [address]  Basic metabolic panel     Status: Abnormal   Collection Time: 12/09/18  3:43 AM  Result Value Ref Range   Sodium 130 (L) 135 - 145 mmol/L   Potassium 3.8 3.5 - 5.1 mmol/L    Chloride 97 (L) 98 - 111 mmol/L   CO2 24 22 - 32 mmol/L   Glucose, Bld 135 (H) 70 - 99 mg/dL   BUN 23 8 - 23 mg/dL   Creatinine, Ser 1.37 (H) 0.61 - 1.24 mg/dL   Calcium 8.7 (L) 8.9 - 10.3 mg/dL   GFR calc non Af Amer 45 (L) >60 mL/min   GFR calc Af Amer 53 (L) >60 mL/min   Anion gap 9 5 - 15    Comment: Performed at [facility] [address] [place], [place] [address]  Brain natriuretic peptide     Status: Abnormal   Collection Time: 12/09/18  3:43 AM  Result Value Ref Range   B Natriuretic Peptide 249.1 (H) 0.0 - 100.0 pg/mL    Comment: Performed at [facility] [address], [place], [place] [address]  Glucose, capillary     Status: Abnormal   Collection Time: 12/09/18  7:47 AM  Result Value Ref Range   Glucose-Capillary 149 (H) 70 - 99 mg/dL  Glucose, capillary     Status: Abnormal   Collection Time: 12/09/18 11:24 AM  Result Value Ref Range   Glucose-Capillary 181 (H) 70 - 99 mg/dL   Dg Chest Port 1 View  Result Date: 12/08/2018 CLINICAL DATA:  Leukocytosis. EXAM: PORTABLE CHEST 1 VIEW COMPARISON:  11/18/2018. FINDINGS:  Stable cardiomegaly. Bibasilar pulmonary infiltrates/edema, progressed from prior exam. Bilateral moderate pleural effusions again noted. No pneumothorax. IMPRESSION: Stable cardiomegaly. Bibasilar pulmonary infiltrates/edema, progressed from prior exam. Progressive CHF and/or pneumonia could present this fashion. Bilateral moderate pleural effusions again noted without interim change. Electronically Signed   By: Marcello Moores  Register   On: 12/08/2018 09:46    Medical Problem List and Plan: 1.  Decreased functional mobility secondary to left transmetatarsal amputation 12/02/2018 with wound VAC applied as well as history of right BKA August 2020  -Will consult with surgical team to determine when patient is cleared to shower.   -ELOS/Goals: MinA in PT and ADLs, independent in SLP  Sleeping well, pain controlled, moving bowels. 2.   Antithrombotics: -DVT/anticoagulation: Eliquis  -antiplatelet therapy: Plavix 75 mg daily 3. Pain Management: Ultram as needed. Well controlled at this time. 4. Mood: Provide emotional support  -antipsychotic agents: N/A 5. Neuropsych: This patient is capable of making decisions on his own behalf. 6. Skin/Wound Care: Routine skin checks 7. Fluids/Electrolytes/Nutrition:  Monitor I/O. Check lytes in am.  8.  Acute blood loss anemia.  Follow-up CBC--history of heme positive stools.  Continue iron supplement 9.  PAF.  Cardiac rate controlled.  Continue Eliquis.  Follow-up cardiology services as needed 10.  Diastolic congestive heart failure.  Monitor weight daily--have been inconsistent. Will continue Lasix 20 mg daily--s/p IV lasix 20 mg 11/24. Was on 40 mg bid PTA due to recent decompensation.  Will recheck CXR and may need to resume due to progressive pleural effusions.  11.  Hypertension.  Norvasc 10 mg daily.  Monitor with increased mobility 12.  Diabetes mellitus with peripheral neuropathy.  Latest hemoglobin A1c 7.0.  SSI.  Patient on glipizide hold. 13.  Hypothyroidism. On Synthroid for supplement 14.  BPH: Monitor voiding with PVR checks. Continue Proscar 5 mg daily, Flomax 0.4 mg twice daily.   15.  Hyperlipidemia.  Lipitor  16. Leucocytosis/?PNA: Monitor cough--on Ceftriaxone/Azithormycin D#2 17. CKD stage III: Will monitor renal status with serial checks.   Reesa Chew, PA  I have personally performed a face to face diagnostic evaluation, including, but not limited to relevant history and physical exam findings, of this patient and developed relevant assessment and plan.  Additionally, I have reviewed and concur with the physician assistant's documentation above.  Leeroy Cha, MD

## 2018-12-04 NOTE — Progress Notes (Signed)
PROGRESS NOTE    [name]  DEY:[id number] DOB: [birth year] DOA: 11/18/2018 PCP: Redmond School, MD   Brief Narrative: 85 year old past medical history significant for hypertension, hyperlipidemia, diabetes type 2, chronic kidney disease a stage III, PE, A. fib, diastolic heart failure, PAD status post right BKA admitted from home with with black discoloration of first toe on the left foot.  X-ray show evidence of questionable osteomyelitis but reviewing final x-ray, no osteomyelitis was detected.  Patient was admitted on November the for for further management of dry gangrene and cellulitis of the left foot. Patient was a started on IV vancomycin and Zosyn.    Assessment & Plan:   Principal Problem:   Toe infection Active Problems:   Peripheral vascular disease (HCC)   Essential hypertension, benign   Hyperlipidemia   Moderate aortic stenosis   Hypothyroidism   PAF (paroxysmal atrial fibrillation) (HCC)   Type 2 diabetes mellitus with foot ulcer, without long-term current use of insulin (HCC)   Gangrene of left foot (HCC)   Critical limb ischemia with history of revascularization of same extremity  1-First left toe with dry gangrene/ischemic ulcer and cellulitis in the setting of peripheral vascular disease with critical limb ischemia of the left great toe: Patient was a started on IV antibiotics. Blood cultures x 2 no growth today. Status post successful complex revascularization of the left TP trunk into the peroneal artery using 2 overlapping drug-eluting stent as well as drug-coated balloon angioplasty of the distal left popliteal artery. Continue with Plavix.  Discussed with Dr Sharol Given ok to resume eliquis. Will stop aspirin as recommend by cardiology.  S/P  transmetatarsal amputation , 12/02/2018. Completed antibiotics. Patient currently requiring IV morphine for pain controlled.  CIR consulted, awaiting insurance approval.   2-Contact With COVID postive person;  -Per  patient's son, his sister was diagnosed with Covid 33 on Nov 18, last time she was in contact with [name] was Nov 15. She was wearing a mask.  -Patient is asymptomatic currently. Will check him for Covid today.  -Discussed with Staff, continue to use face mask and face shield for patient contact.   3-Hypertension: Continue with Lasix and amlodipine  4-Hyperlipidemia: Continue Lipitor  5-Hypothyroidism: Continue with Synthroid  Chronic kidney disease a stage IIIb: Creatinine baseline 1.5--1.7.  Stable Lasix was resumed lower dose   Anemia: iron deficiency anemia.  Questionable of delusional. Monitor hemoglobin. Low iron, low normal B 12. B 12 injection, oral iron supplement ordered.  Hemoglobin stable at 10.  Diabetes type 2 with foot ulcer: Hold glipizide Continue sliding scale insulin  BPH: Continue Flomax and finasteride Pressure injury left heel stage III: Present on admission, local care.   Pressure Injury 09/10/18 Heel Left Stage II -  Partial thickness loss of dermis presenting as a shallow open ulcer with a red, pink wound bed without slough. was charted on 09/10/18 as a stage 1 under other wound column, now is a stage 2/open blister (Active)  09/10/18 1718  Location: Heel  Location Orientation: Left  Staging: Stage II -  Partial thickness loss of dermis presenting as a shallow open ulcer with a red, pink wound bed without slough.  Wound Description (Comments): was charted on 09/10/18 as a stage 1 under other wound column, now is a stage 2/open blister  Present on Admission: Yes     Estimated body mass index is 30.75 kg/m as calculated from the following:   Height as of this encounter: 5\' 10"  (1.778 m).  Weight as of this encounter: 97.2 kg.   DVT prophylaxis:  Code Status: DNR Family Communication: Care discussed with patient and Son who was at bedside.  Disposition Plan: Plan for transmetatarsal amputation today. Consultants:   Dr. Sharol Given  Cardiology   Procedures:   Transmetatarsal amputation 11/18  Antimicrobials:    Subjective: He is not feeling well today, he doesn't know why. He is complaining of foot pain.  Son report patient gets very confuse with oxycodone, he would like for patient not to get oxycodone.   Objective: Vitals:   12/04/18 0438 12/04/18 0440 12/04/18 0909 12/04/18 1356  BP: (!) 145/61  (!) 132/57 (!) 130/55  Pulse: 60   65  Resp: 14     Temp: 98 F (36.7 C)   98.2 F (36.8 C)  TempSrc: Oral   Oral  SpO2: 99%   98%  Weight:  97.2 kg    Height:        Intake/Output Summary (Last 24 hours) at 12/04/2018 1529 Last data filed at 12/04/2018 2706 Gross per 24 hour  Intake 340 ml  Output 2075 ml  Net -1735 ml   Filed Weights   11/30/18 0437 12/01/18 0500 12/04/18 0440  Weight: 94.8 kg 94.2 kg 97.2 kg    Examination:  General exam: NAD Respiratory system: CTA Cardiovascular system: S 1 , S 2 RRR Gastrointestinal system: BS present,, soft, nt Central nervous system: Alert, non focal.  Extremities: Right BKA, Left LE S/P transmetatarsal amputation with dressing, wound vac   Data Reviewed: I have personally reviewed following labs and imaging studies  CBC: Recent Labs  Lab 12/02/18 0828 12/03/18 0822 12/04/18 0341  WBC 9.0 9.1 8.8  HGB 10.0* 10.0* 9.9*  HCT 29.1* 29.7* 30.1*  MCV 90.4 91.4 92.9  PLT 256 294 237   Basic Metabolic Panel: Recent Labs  Lab 11/30/18 0649 12/02/18 0828 12/03/18 0822 12/04/18 0341  NA 134* 134* 133* 134*  K 3.8 3.7 3.8 4.2  CL 100 103 99 100  CO2 22 21* 24 24  GLUCOSE 134* 231* 143* 130*  BUN 19 17 17 18   CREATININE 1.25* 1.17 1.23 1.29*  CALCIUM 8.5* 8.5* 8.6* 9.0   GFR: Estimated Creatinine Clearance: 45.4 mL/min (A) (by C-G formula based on SCr of 1.29 mg/dL (H)). Liver Function Tests: No results for input(s): AST, ALT, ALKPHOS, BILITOT, PROT, ALBUMIN in the last 168 hours. No results for input(s): LIPASE, AMYLASE in the last 168 hours. No  results for input(s): AMMONIA in the last 168 hours. Coagulation Profile: No results for input(s): INR, PROTIME in the last 168 hours. Cardiac Enzymes: No results for input(s): CKTOTAL, CKMB, CKMBINDEX, TROPONINI in the last 168 hours. BNP (last 3 results) No results for input(s): PROBNP in the last 8760 hours. HbA1C: No results for input(s): HGBA1C in the last 72 hours. CBG: Recent Labs  Lab 12/03/18 1128 12/03/18 1639 12/03/18 2116 12/04/18 0752 12/04/18 1123  GLUCAP 189* 119* 177* 118* 163*   Lipid Profile: No results for input(s): CHOL, HDL, LDLCALC, TRIG, CHOLHDL, LDLDIRECT in the last 72 hours. Thyroid Function Tests: No results for input(s): TSH, T4TOTAL, FREET4, T3FREE, THYROIDAB in the last 72 hours. Anemia Panel: Recent Labs    12/04/18 0341  VITAMINB12 292  FOLATE 11.9  FERRITIN 269  TIBC 217*  IRON 37*  RETICCTPCT 2.9   Sepsis Labs: No results for input(s): PROCALCITON, LATICACIDVEN in the last 168 hours.  No results found for this or any previous visit (from the past 240  hour(s)).       Radiology Studies: No results found.      Scheduled Meds: . acetaminophen  500 mg Oral TID  . amLODipine  10 mg Oral Daily  . apixaban  5 mg Oral BID  . atorvastatin  40 mg Oral Daily  . clopidogrel  75 mg Oral Q breakfast  . collagenase   Topical Daily  . dorzolamide-timolol  1 drop Both Eyes BID  . famotidine  20 mg Oral Daily  . finasteride  5 mg Oral QPM  . furosemide  20 mg Oral Daily  . insulin aspart  0-15 Units Subcutaneous TID WC  . insulin aspart  0-5 Units Subcutaneous QHS  . iron polysaccharides  150 mg Oral Daily  . levothyroxine  50 mcg Oral Q0600  . polyethylene glycol  17 g Oral BID  . potassium chloride  10 mEq Oral Daily  . senna  1 tablet Oral BID  . sodium chloride flush  10-40 mL Intracatheter Q12H  . sodium chloride flush  3 mL Intravenous Q12H  . sodium chloride flush  3 mL Intravenous Q12H  . sodium chloride flush  3 mL  Intravenous Q12H  . tamsulosin  0.4 mg Oral BID   Continuous Infusions: . sodium chloride 250 mL (11/26/18 1031)  . sodium chloride       LOS: 15 days    Time spent: 35 minutes./     Traeson Dusza A Breon Rehm, MD [facility]   If 7PM-7AM, please contact night-coverage [email or web address] Password [facility] 12/04/2018, 3:29 PM

## 2018-12-04 NOTE — Progress Notes (Addendum)
Progress Note  Patient Name: [name] Date of Encounter: 12/04/2018  Primary Cardiologist: Pixie Casino, MD   Subjective   Mr. Holzmann reports that he is still having some foot pain, he denies any chest pain or SOB. He denies any new complaints.   Inpatient Medications    Scheduled Meds:  amLODipine  10 mg Oral Daily   apixaban  2.5 mg Oral BID   atorvastatin  40 mg Oral Daily   clopidogrel  75 mg Oral Q breakfast   collagenase   Topical Daily   dorzolamide-timolol  1 drop Both Eyes BID   famotidine  20 mg Oral Daily   finasteride  5 mg Oral QPM   furosemide  20 mg Oral Daily   insulin aspart  0-15 Units Subcutaneous TID WC   insulin aspart  0-5 Units Subcutaneous QHS   levothyroxine  50 mcg Oral Q0600   potassium chloride  10 mEq Oral Daily   senna  1 tablet Oral BID   sodium chloride flush  10-40 mL Intracatheter Q12H   sodium chloride flush  3 mL Intravenous Q12H   sodium chloride flush  3 mL Intravenous Q12H   sodium chloride flush  3 mL Intravenous Q12H   tamsulosin  0.4 mg Oral BID   Continuous Infusions:  sodium chloride 250 mL (11/26/18 1031)   sodium chloride     PRN Meds: sodium chloride, sodium chloride, acetaminophen, morphine injection, ondansetron (ZOFRAN) IV, oxyCODONE, sodium chloride flush, sodium chloride flush, sodium chloride flush   Vital Signs    Vitals:   12/03/18 1432 12/03/18 2020 12/04/18 0438 12/04/18 0440  BP:  (!) 129/54 (!) 145/61   Pulse: 63 63 60   Resp:   14   Temp: 97.7 F (36.5 C) 97.6 F (36.4 C) 98 F (36.7 C)   TempSrc: Oral Oral Oral   SpO2: 98% 95% 99%   Weight:    97.2 kg  Height:        Intake/Output Summary (Last 24 hours) at 12/04/2018 0610 Last data filed at 12/04/2018 0435 Gross per 24 hour  Intake 1060 ml  Output 2775 ml  Net -1715 ml   Filed Weights   11/30/18 0437 12/01/18 0500 12/04/18 0440  Weight: 94.8 kg 94.2 kg 97.2 kg    Telemetry    Sinus bradycardia -  Personally Reviewed  ECG    None today  Physical Exam   GEN: No acute distress. Elderly male, laying in bed Neck: No JVD Cardiac: RRR, systolic murmur, no rubs, or gallops.  Respiratory: Clear to auscultation bilaterally. GI: Soft, nontender, non-distended  MS: No edema; Right BKA. Left transmetatarsal amputation with wound vac in place.  Neuro:  Nonfocal  Psych: Normal affect   Labs    Chemistry Recent Labs  Lab 12/02/18 0828 12/03/18 0822 12/04/18 0341  NA 134* 133* 134*  K 3.7 3.8 4.2  CL 103 99 100  CO2 21* 24 24  GLUCOSE 231* 143* 130*  BUN 17 17 18   CREATININE 1.17 1.23 1.29*  CALCIUM 8.5* 8.6* 9.0  GFRNONAA 55* 52* 49*  GFRAA >60 60* 57*  ANIONGAP 10 10 10      Hematology Recent Labs  Lab 12/02/18 0828 12/03/18 0822 12/04/18 0341  WBC 9.0 9.1 8.8  RBC 3.22* 3.25* 3.24*   3.24*  HGB 10.0* 10.0* 9.9*  HCT 29.1* 29.7* 30.1*  MCV 90.4 91.4 92.9  MCH 31.1 30.8 30.6  MCHC 34.4 33.7 32.9  RDW 16.0* 15.9* 16.0*  PLT 256 294 278    Cardiac EnzymesNo results for input(s): TROPONINI in the last 168 hours. No results for input(s): TROPIPOC in the last 168 hours.   BNPNo results for input(s): BNP, PROBNP in the last 168 hours.   DDimer No results for input(s): DDIMER in the last 168 hours.   Radiology    No results found.  Cardiac Studies   PV angiogram: 11/23/18  Procedures Performed: 1. Ultrasound-guided right common femoral access/contralateral access 2. Placement of an NAV6 distal protection device below the knee left popliteal artery 3. Diamondback orbital rotational atherectomy distal left SFA and popliteal artery 4. DCB distal left SFA/popliteal artery  Final Impression:Successful diamondback orbital rotational atherectomy, DCB using distal protection of a highly calcified subtotally occluded distal left SFA and popliteal artery in the setting of critical limb ischemia. The  dominant vessel below the knee is a peroneal. There does seem to be a subtotally occluded tibioperoneal trunk which could be percutaneously revascularizable. Because had use 160 cc did die and his creatinine clearance was in the 35 mL/h range I decided to terminate the procedure. He will need to be gently hydrated overnight and treated with antiplatelet therapy. He may ultimately really need a staged peroneal intervention.  [name], [facility]  PV angiogram: 11/25/18  Successful complex revascularization of the left TP trunk into the peroneal artery using 2 overlapping drug-eluting stents as well as drug coated balloon angioplasty of the distal left popliteal artery. The occlusion was crossed retrograde via the peroneal artery.  Recommendations: Continue dual antiplatelet therapy. Hydrate overnight as he is high risk for contrast-induced nephropathy.  Patient Profile     85 y.o. male with a history of HTN, HLD, PAF, OSA, CKD III, DM, and PVD s/p stent to Lt SFA 05/2010 after infection of right great toe and eventual BKA in 08/2018 who presented 11/18/2018 with worsening ischemic ulcer to left great toe with cellulitis  Assessment & Plan    1. VQM:[id number] in NSR, rate controlled. Denies any palpitations or chest pain. Was on eliquis at home, this was on hold for surgery. Orthopedics took for amputation on 12/02/18.  -Continue telemetry -Eliquis has been resumed  2. Critical limb ischemia of left great toe: S/p PV angiogram with successful arthrectomy to occluded left SFA and popliteal artery 11/23/18 with Dr. Gwenlyn Found. Staged intervention with Dr. Sophronia Simas 11/26/18. S/p left metatarsal amputation 12/02/18.  Is currently on DAPT with ASA and plavix, can stop ASA when eliquis is resumed. S/p metatarsal amputation11/18/20.No noted complications during surgery.  -Continue ASA and plavix -Discontinue ASA with eliquis being resumed -It appears that patient is being discharged to CIR  today -Follow up with cardiology in 2-3 weeks  3. CKD stage III: Crhas been stable, today it is 1.29  4. Moderate AS: Murmur noted on exam, echocardiogram 08/2018 showed mean gradient of 25 mmHg. Denies any light headedness or dizziness.  -Continue lasix PO on discharge  5. HTN: BP ranged 129/54-145/61. Currently on amlodipine 10 mg daily.   6. Anemia: Hgbtoday 9.9. No active bleeding noted. Continue to monitor.  [facility] will sign off.   Medication Recommendations:  As above Other recommendations (labs, testing, etc):  None Follow up as an outpatient:  2-3 weeks with Dr. Gwenlyn Found  For questions or updates, please contact [place] Please consult [email or web address] for contact info under Cardiology/STEMI.      Signed, Asencion Noble, MD  12/04/2018, 6:10 AM    Agree with note by Dr. Lonia Skinner  Patient well-known to me.  Postop day 2 left TMA by Dr. Sharol Given for critical limb ischemia after complex staged left SFA, popliteal and tibioperoneal trunk intervention for limb salvage.  He his wound is wrapped and he has a wound VAC on.  He is otherwise stable.  Does have moderate aortic stenosis.  Is back on his Eliquis.  He is on low-dose oral diuretic.  Plans are for transfer to [facility] for inpatient rehab.  I will see him back in the office several weeks after discharge.  Lorretta Harp, M.D., [place], [facility], [place] [address] [place], [place]  [id number]  [phone] 12/04/2018 10:10 AM

## 2018-12-05 DIAGNOSIS — I96 Gangrene, not elsewhere classified: Secondary | ICD-10-CM

## 2018-12-05 LAB — CBC
HCT: 29.9 % — ABNORMAL LOW (ref 39.0–52.0)
Hemoglobin: 10.1 g/dL — ABNORMAL LOW (ref 13.0–17.0)
MCH: 31 pg (ref 26.0–34.0)
MCHC: 33.8 g/dL (ref 30.0–36.0)
MCV: 91.7 fL (ref 80.0–100.0)
Platelets: 279 10*3/uL (ref 150–400)
RBC: 3.26 MIL/uL — ABNORMAL LOW (ref 4.22–5.81)
RDW: 16.1 % — ABNORMAL HIGH (ref 11.5–15.5)
WBC: 9.9 10*3/uL (ref 4.0–10.5)
nRBC: 0 % (ref 0.0–0.2)

## 2018-12-05 LAB — GLUCOSE, CAPILLARY
Glucose-Capillary: 119 mg/dL — ABNORMAL HIGH (ref 70–99)
Glucose-Capillary: 176 mg/dL — ABNORMAL HIGH (ref 70–99)
Glucose-Capillary: 180 mg/dL — ABNORMAL HIGH (ref 70–99)
Glucose-Capillary: 163 mg/dL — ABNORMAL HIGH (ref 70–99)

## 2018-12-05 LAB — BASIC METABOLIC PANEL
Anion gap: 10 (ref 5–15)
BUN: 17 mg/dL (ref 8–23)
CO2: 23 mmol/L (ref 22–32)
Calcium: 8.8 mg/dL — ABNORMAL LOW (ref 8.9–10.3)
Chloride: 99 mmol/L (ref 98–111)
Creatinine, Ser: 1.2 mg/dL (ref 0.61–1.24)
GFR calc Af Amer: >60 mL/min (ref >60)
GFR calc non Af Amer: 53 mL/min — ABNORMAL LOW (ref >60)
Glucose, Bld: 145 mg/dL — ABNORMAL HIGH (ref 70–99)
Potassium: 4.1 mmol/L (ref 3.5–5.1)
Sodium: 132 mmol/L — ABNORMAL LOW (ref 135–145)

## 2018-12-05 NOTE — Progress Notes (Signed)
PROGRESS NOTE    [name]  LDJ:[id number] DOB: [birth year] DOA: 11/18/2018 PCP: Redmond School, MD   Brief Narrative: 85 year old past medical history significant for hypertension, hyperlipidemia, diabetes type 2, chronic kidney disease a stage III, PE, A. fib, diastolic heart failure, PAD status post right BKA admitted from home with with black discoloration of first toe on the left foot.  X-ray show evidence of questionable osteomyelitis but reviewing final x-ray, no osteomyelitis was detected.  Patient was admitted on November  for for further management of dry gangrene and cellulitis of the left foot. Patient was a started on IV vancomycin and Zosyn.    Assessment & Plan:   Principal Problem:   Toe infection Active Problems:   Peripheral vascular disease (HCC)   Essential hypertension, benign   Hyperlipidemia   Moderate aortic stenosis   Hypothyroidism   PAF (paroxysmal atrial fibrillation) (HCC)   Type 2 diabetes mellitus with foot ulcer, without long-term current use of insulin (HCC)   Gangrene of left foot (HCC)   Critical limb ischemia with history of revascularization of same extremity  1-First left toe with dry gangrene/ischemic ulcer and cellulitis in the setting of peripheral vascular disease with critical limb ischemia of the left great toe: Patient was a started on IV antibiotics. Blood cultures x 2 no growth. Status post successful complex revascularization of the left TP trunk into the peroneal artery using 2 overlapping drug-eluting stent as well as drug-coated balloon angioplasty of the distal left popliteal artery. Continue with Plavix.  Discussed with Dr Sharol Given ok to resume eliquis.  aspirin stop as recommend by cardiology.  S/P  transmetatarsal amputation , 12/02/2018. Completed antibiotics. Patient currently requiring IV morphine for pain controlled. Received two doses of morphine 11-20, one dose today so far.  CIR consulted, awaiting insurance approval.    2-Contact With COVID postive person;  -Per patient's son, his sister was diagnosed with Covid 63 on Nov 18, last time she was in contact with Mr Chavira was Nov 15. She was wearing a mask.  -Patient is asymptomatic currently. COVID 19 repeated 11-20 negative -Discussed with Staff, continue to use face mask and face shield for patient contact.   3-Hypertension: Continue with Lasix and amlodipine  4-Hyperlipidemia: Continue Lipitor  5-Hypothyroidism: Continue with Synthroid  Chronic kidney disease a stage IIIb: Creatinine baseline 1.5--1.7.  Stable Lasix was resumed lower dose   Anemia: iron deficiency anemia.  Questionable of delusional. Monitor hemoglobin. Low iron, low normal B 12. B 12 injection, oral iron supplement ordered.  Hemoglobin stable at 10.  Diabetes type 2 with foot ulcer: Hold glipizide Continue sliding scale insulin  BPH: Continue Flomax and finasteride Pressure injury left heel stage III: Present on admission, local care.   Pressure Injury 09/10/18 Heel Left Stage II -  Partial thickness loss of dermis presenting as a shallow open ulcer with a red, pink wound bed without slough. was charted on 09/10/18 as a stage 1 under other wound column, now is a stage 2/open blister (Active)  09/10/18 1718  Location: Heel  Location Orientation: Left  Staging: Stage II -  Partial thickness loss of dermis presenting as a shallow open ulcer with a red, pink wound bed without slough.  Wound Description (Comments): was charted on 09/10/18 as a stage 1 under other wound column, now is a stage 2/open blister  Present on Admission: Yes     Estimated body mass index is 30.84 kg/m as calculated from the following:   Height as of  this encounter: 5\' 10"  (1.778 m).   Weight as of this encounter: 97.5 kg.   DVT prophylaxis:  Code Status: DNR Family Communication: Care discussed with patient and Son who was at bedside 11-20 Disposition Plan: awaiting insurance approval for CIR  admission Consultants:   Dr. Sharol Given  Cardiology  Procedures:   Transmetatarsal amputation 11/18  Antimicrobials:    Subjective: He is alert and oriented to person and place.  He is a still complaining of left foot pain.  He report no cough, shortness of breath, fever.  Denies sore throat.  Objective: Vitals:   12/04/18 1944 12/04/18 1952 12/05/18 0423 12/05/18 0844  BP:  (!) 109/55 (!) 129/55 (!) 126/54  Pulse:  (!) 59 (!) 57   Resp: 13     Temp:  98.2 F (36.8 C) 98.3 F (36.8 C)   TempSrc:  Oral Oral   SpO2:  98% 96%   Weight:   97.5 kg   Height:        Intake/Output Summary (Last 24 hours) at 12/05/2018 1131 Last data filed at 12/05/2018 0600 Gross per 24 hour  Intake 25 ml  Output 2000 ml  Net -1975 ml   Filed Weights   12/01/18 0500 12/04/18 0440 12/05/18 0423  Weight: 94.2 kg 97.2 kg 97.5 kg    Examination:  General exam: NAD Respiratory system: CTA Cardiovascular system: S 1, S 2 RRR Gastrointestinal system: BS present, soft, nt Central nervous system: Non focal Extremities: Right BKA, left lower extremity status post transmetatarsal amputation dressing in place wound VAC in place.   Data Reviewed: I have personally reviewed following labs and imaging studies  CBC: Recent Labs  Lab 12/02/18 0828 12/03/18 0822 12/04/18 0341 12/05/18 0819  WBC 9.0 9.1 8.8 9.9  HGB 10.0* 10.0* 9.9* 10.1*  HCT 29.1* 29.7* 30.1* 29.9*  MCV 90.4 91.4 92.9 91.7  PLT 256 294 278 500   Basic Metabolic Panel: Recent Labs  Lab 11/30/18 0649 12/02/18 0828 12/03/18 0822 12/04/18 0341 12/05/18 0819  NA 134* 134* 133* 134* 132*  K 3.8 3.7 3.8 4.2 4.1  CL 100 103 99 100 99  CO2 22 21* 24 24 23   GLUCOSE 134* 231* 143* 130* 145*  BUN 19 17 17 18 17   CREATININE 1.25* 1.17 1.23 1.29* 1.20  CALCIUM 8.5* 8.5* 8.6* 9.0 8.8*   GFR: Estimated Creatinine Clearance: 48.9 mL/min (by C-G formula based on SCr of 1.2 mg/dL). Liver Function Tests: No results for input(s):  AST, ALT, ALKPHOS, BILITOT, PROT, ALBUMIN in the last 168 hours. No results for input(s): LIPASE, AMYLASE in the last 168 hours. No results for input(s): AMMONIA in the last 168 hours. Coagulation Profile: No results for input(s): INR, PROTIME in the last 168 hours. Cardiac Enzymes: No results for input(s): CKTOTAL, CKMB, CKMBINDEX, TROPONINI in the last 168 hours. BNP (last 3 results) No results for input(s): PROBNP in the last 8760 hours. HbA1C: No results for input(s): HGBA1C in the last 72 hours. CBG: Recent Labs  Lab 12/04/18 1123 12/04/18 1654 12/04/18 2126 12/05/18 0733 12/05/18 1107  GLUCAP 163* 130* 147* 119* 176*   Lipid Profile: No results for input(s): CHOL, HDL, LDLCALC, TRIG, CHOLHDL, LDLDIRECT in the last 72 hours. Thyroid Function Tests: No results for input(s): TSH, T4TOTAL, FREET4, T3FREE, THYROIDAB in the last 72 hours. Anemia Panel: Recent Labs    12/04/18 0341  VITAMINB12 292  FOLATE 11.9  FERRITIN 269  TIBC 217*  IRON 37*  RETICCTPCT 2.9   Sepsis Labs:  No results for input(s): PROCALCITON, LATICACIDVEN in the last 168 hours.  Recent Results (from the past 240 hour(s))  SARS CORONAVIRUS 2 (TAT 6-24 HRS) Nasopharyngeal Nasopharyngeal Swab     Status: None   Collection Time: 12/04/18  3:15 PM   Specimen: Nasopharyngeal Swab  Result Value Ref Range Status   SARS Coronavirus 2 NEGATIVE NEGATIVE Final    Comment: (NOTE) SARS-CoV-2 target nucleic acids are NOT DETECTED. The SARS-CoV-2 RNA is generally detectable in upper and lower respiratory specimens during the acute phase of infection. Negative results do not preclude SARS-CoV-2 infection, do not rule out co-infections with other pathogens, and should not be used as the sole basis for treatment or other patient management decisions. Negative results must be combined with clinical observations, patient history, and epidemiological information. The expected result is Negative. Fact Sheet for  Patients: SugarRoll.be Fact Sheet for Healthcare Providers: https://www.woods-mathews.com/ This test is not yet approved or cleared by the [place] FDA and  has been authorized for detection and/or diagnosis of SARS-CoV-2 by FDA under an Emergency Use Authorization (EUA). This EUA will remain  in effect (meaning this test can be used) for the duration of the COVID-19 declaration under Section 56 4(b)(1) of the Act, 21 U.S.C. section 360bbb-3(b)(1), unless the authorization is terminated or revoked sooner. Performed at [facility], [name] [address] [place], [place] [address]          Radiology Studies: No results found.      Scheduled Meds: . acetaminophen  500 mg Oral TID  . amLODipine  10 mg Oral Daily  . apixaban  5 mg Oral BID  . atorvastatin  40 mg Oral Daily  . clopidogrel  75 mg Oral Q breakfast  . collagenase   Topical Daily  . dorzolamide-timolol  1 drop Both Eyes BID  . famotidine  20 mg Oral Daily  . finasteride  5 mg Oral QPM  . furosemide  20 mg Oral Daily  . insulin aspart  0-15 Units Subcutaneous TID WC  . insulin aspart  0-5 Units Subcutaneous QHS  . iron polysaccharides  150 mg Oral Daily  . levothyroxine  50 mcg Oral Q0600  . polyethylene glycol  17 g Oral BID  . potassium chloride  10 mEq Oral Daily  . senna  1 tablet Oral BID  . sodium chloride flush  10-40 mL Intracatheter Q12H  . tamsulosin  0.4 mg Oral BID   Continuous Infusions: . sodium chloride 250 mL (11/26/18 1031)  . sodium chloride       LOS: 16 days    Time spent: 35 minutes./     Laconya Clere A Elin Fenley, MD [facility]   If 7PM-7AM, please contact night-coverage [email or web address] Password [facility] 12/05/2018, 11:31 AM

## 2018-12-06 LAB — GLUCOSE, CAPILLARY
Glucose-Capillary: 122 mg/dL — ABNORMAL HIGH (ref 70–99)
Glucose-Capillary: 145 mg/dL — ABNORMAL HIGH (ref 70–99)
Glucose-Capillary: 197 mg/dL — ABNORMAL HIGH (ref 70–99)

## 2018-12-06 NOTE — Progress Notes (Signed)
PROGRESS NOTE    [name]  HUT:[id number] DOB: November 05, 1927 DOA: 11/18/2018 PCP: Redmond School, MD   Brief Narrative: 85 year old past medical history significant for hypertension, hyperlipidemia, diabetes type 2, chronic kidney disease a stage III, PE, A. fib, diastolic heart failure, PAD status post right BKA admitted from home with with black discoloration of first toe on the left foot.  X-ray show evidence of questionable osteomyelitis but reviewing final x-ray, no osteomyelitis was detected.  Patient was admitted on November  for for further management of dry gangrene and cellulitis of the left foot. Patient was a started on IV vancomycin and Zosyn.    Assessment & Plan:   Principal Problem:   Toe infection Active Problems:   Peripheral vascular disease (HCC)   Essential hypertension, benign   Hyperlipidemia   Moderate aortic stenosis   Hypothyroidism   PAF (paroxysmal atrial fibrillation) (HCC)   Type 2 diabetes mellitus with foot ulcer, without long-term current use of insulin (HCC)   Gangrene of left foot (HCC)   Critical limb ischemia with history of revascularization of same extremity  1-First left toe with dry gangrene/ischemic ulcer and cellulitis in the setting of peripheral vascular disease with critical limb ischemia of the left great toe: Patient was a started on IV antibiotics. Blood cultures x 2 no growth. Status post successful complex revascularization of the left TP trunk into the peroneal artery using 2 overlapping drug-eluting stent as well as drug-coated balloon angioplasty of the distal left popliteal artery. Continue with Plavix.  Discussed with Dr Sharol Given ok to resume eliquis.  aspirin stop as recommend by cardiology.  S/P  transmetatarsal amputation , 12/02/2018. Completed antibiotics. Patient currently requiring IV morphine for pain controlled.  CIR consulted, awaiting insurance approval.   2-Contact With COVID postive person;  -Per patient's son,  his sister was diagnosed with Covid 107 on Nov 18, last time she was in contact with [name] was Nov 15. She was wearing a mask.  -Patient is asymptomatic currently. COVID 19 repeated 11-20 negative -Discussed with Staff, continue to use face mask and face shield for patient contact.   3-Hypertension: Continue with Lasix and amlodipine  4-Hyperlipidemia: Continue Lipitor  5-Hypothyroidism: Continue with Synthroid  Chronic kidney disease a stage IIIb: Creatinine baseline 1.5--1.7.  Stable Lasix was resumed lower dose   Anemia: iron deficiency anemia.  Questionable of delusional. Monitor hemoglobin. Low iron, low normal B 12. B 12 injection, oral iron supplement ordered.  Hemoglobin stable at 10.  Diabetes type 2 with foot ulcer: Hold glipizide Continue sliding scale insulin  BPH: Continue Flomax and finasteride Pressure injury left heel stage III: Present on admission, local care.  Diarrhea; hold laxatives. Monitor for further symptoms.   Pressure Injury 09/10/18 Heel Left Stage II -  Partial thickness loss of dermis presenting as a shallow open ulcer with a red, pink wound bed without slough. was charted on 09/10/18 as a stage 1 under other wound column, now is a stage 2/open blister (Active)  09/10/18 1718  Location: Heel  Location Orientation: Left  Staging: Stage II -  Partial thickness loss of dermis presenting as a shallow open ulcer with a red, pink wound bed without slough.  Wound Description (Comments): was charted on 09/10/18 as a stage 1 under other wound column, now is a stage 2/open blister  Present on Admission: Yes     Estimated body mass index is 30.61 kg/m as calculated from the following:   Height as of this encounter: 5'  10" (1.778 m).   Weight as of this encounter: 96.8 kg.   DVT prophylaxis:  Code Status: DNR Family Communication: Care discussed with patient and Son who was at bedside 11-20 Disposition Plan: awaiting insurance approval for CIR  admission Consultants:   Dr. Sharol Given  Cardiology  Procedures:   Transmetatarsal amputation 11/18  Antimicrobials:    Subjective: He is alert, pain controlled on IV pain meds.  Had multiples episodes of diarrhea last night. Denies abdominal pain, or cough.   Objective: Vitals:   12/05/18 0844 12/05/18 1543 12/05/18 2047 12/06/18 0340  BP: (!) 126/54 (!) 137/57 (!) 131/57 (!) 145/60  Pulse:  60 62 63  Resp:  13 13 13   Temp:  98.2 F (36.8 C) 98.6 F (37 C) 98.5 F (36.9 C)  TempSrc:  Oral Oral Oral  SpO2:  97% 98% 100%  Weight:    96.8 kg  Height:        Intake/Output Summary (Last 24 hours) at 12/06/2018 5366 Last data filed at 12/06/2018 4403 Gross per 24 hour  Intake 50 ml  Output 2000 ml  Net -1950 ml   Filed Weights   12/04/18 0440 12/05/18 0423 12/06/18 0340  Weight: 97.2 kg 97.5 kg 96.8 kg    Examination:  General exam: NAD Respiratory system: CTA Cardiovascular system: S 1, S 2 RRR Gastrointestinal system: BS present, soft, nt Central nervous system:Non focal.  Extremities: Right BKA, left LE S/P transmetatarsal amputation with wound vac in place.    Data Reviewed: I have personally reviewed following labs and imaging studies  CBC: Recent Labs  Lab 12/02/18 0828 12/03/18 0822 12/04/18 0341 12/05/18 0819  WBC 9.0 9.1 8.8 9.9  HGB 10.0* 10.0* 9.9* 10.1*  HCT 29.1* 29.7* 30.1* 29.9*  MCV 90.4 91.4 92.9 91.7  PLT 256 294 278 474   Basic Metabolic Panel: Recent Labs  Lab 11/30/18 0649 12/02/18 0828 12/03/18 0822 12/04/18 0341 12/05/18 0819  NA 134* 134* 133* 134* 132*  K 3.8 3.7 3.8 4.2 4.1  CL 100 103 99 100 99  CO2 22 21* 24 24 23   GLUCOSE 134* 231* 143* 130* 145*  BUN 19 17 17 18 17   CREATININE 1.25* 1.17 1.23 1.29* 1.20  CALCIUM 8.5* 8.5* 8.6* 9.0 8.8*   GFR: Estimated Creatinine Clearance: 48.7 mL/min (by C-G formula based on SCr of 1.2 mg/dL). Liver Function Tests: No results for input(s): AST, ALT, ALKPHOS, BILITOT, PROT,  ALBUMIN in the last 168 hours. No results for input(s): LIPASE, AMYLASE in the last 168 hours. No results for input(s): AMMONIA in the last 168 hours. Coagulation Profile: No results for input(s): INR, PROTIME in the last 168 hours. Cardiac Enzymes: No results for input(s): CKTOTAL, CKMB, CKMBINDEX, TROPONINI in the last 168 hours. BNP (last 3 results) No results for input(s): PROBNP in the last 8760 hours. HbA1C: No results for input(s): HGBA1C in the last 72 hours. CBG: Recent Labs  Lab 12/05/18 0733 12/05/18 1107 12/05/18 1647 12/05/18 2138 12/06/18 0758  GLUCAP 119* 176* 180* 163* 122*   Lipid Profile: No results for input(s): CHOL, HDL, LDLCALC, TRIG, CHOLHDL, LDLDIRECT in the last 72 hours. Thyroid Function Tests: No results for input(s): TSH, T4TOTAL, FREET4, T3FREE, THYROIDAB in the last 72 hours. Anemia Panel: Recent Labs    12/04/18 0341  VITAMINB12 292  FOLATE 11.9  FERRITIN 269  TIBC 217*  IRON 37*  RETICCTPCT 2.9   Sepsis Labs: No results for input(s): PROCALCITON, LATICACIDVEN in the last 168 hours.  Recent  Results (from the past 240 hour(s))  SARS CORONAVIRUS 2 (TAT 6-24 HRS) Nasopharyngeal Nasopharyngeal Swab     Status: None   Collection Time: 12/04/18  3:15 PM   Specimen: Nasopharyngeal Swab  Result Value Ref Range Status   SARS Coronavirus 2 NEGATIVE NEGATIVE Final    Comment: (NOTE) SARS-CoV-2 target nucleic acids are NOT DETECTED. The SARS-CoV-2 RNA is generally detectable in upper and lower respiratory specimens during the acute phase of infection. Negative results do not preclude SARS-CoV-2 infection, do not rule out co-infections with other pathogens, and should not be used as the sole basis for treatment or other patient management decisions. Negative results must be combined with clinical observations, patient history, and epidemiological information. The expected result is Negative. Fact Sheet for Patients:  SugarRoll.be Fact Sheet for Healthcare Providers: https://www.woods-mathews.com/ This test is not yet approved or cleared by the [place] FDA and  has been authorized for detection and/or diagnosis of SARS-CoV-2 by FDA under an Emergency Use Authorization (EUA). This EUA will remain  in effect (meaning this test can be used) for the duration of the COVID-19 declaration under Section 56 4(b)(1) of the Act, 21 U.S.C. section 360bbb-3(b)(1), unless the authorization is terminated or revoked sooner. Performed at [facility], [place] [address] [place], [place] [address]          Radiology Studies: No results found.      Scheduled Meds: . acetaminophen  500 mg Oral TID  . amLODipine  10 mg Oral Daily  . apixaban  5 mg Oral BID  . atorvastatin  40 mg Oral Daily  . clopidogrel  75 mg Oral Q breakfast  . collagenase   Topical Daily  . dorzolamide-timolol  1 drop Both Eyes BID  . famotidine  20 mg Oral Daily  . finasteride  5 mg Oral QPM  . furosemide  20 mg Oral Daily  . insulin aspart  0-15 Units Subcutaneous TID WC  . insulin aspart  0-5 Units Subcutaneous QHS  . iron polysaccharides  150 mg Oral Daily  . levothyroxine  50 mcg Oral Q0600  . polyethylene glycol  17 g Oral BID  . potassium chloride  10 mEq Oral Daily  . senna  1 tablet Oral BID  . sodium chloride flush  10-40 mL Intracatheter Q12H  . tamsulosin  0.4 mg Oral BID   Continuous Infusions: . sodium chloride 250 mL (11/26/18 1031)  . sodium chloride       LOS: 17 days    Time spent: 35 minutes./     Tyrese Capriotti A Octavia Mottola, MD [facility]   If 7PM-7AM, please contact night-coverage [email or web address] Password [facility] 12/06/2018, 8:28 AM

## 2018-12-07 LAB — CBC
HCT: 29.1 % — ABNORMAL LOW (ref 39.0–52.0)
Hemoglobin: 9.7 g/dL — ABNORMAL LOW (ref 13.0–17.0)
MCH: 30.7 pg (ref 26.0–34.0)
MCHC: 33.3 g/dL (ref 30.0–36.0)
MCV: 92.1 fL (ref 80.0–100.0)
Platelets: 270 10*3/uL (ref 150–400)
RBC: 3.16 MIL/uL — ABNORMAL LOW (ref 4.22–5.81)
RDW: 16 % — ABNORMAL HIGH (ref 11.5–15.5)
WBC: 13 10*3/uL — ABNORMAL HIGH (ref 4.0–10.5)
nRBC: 0 % (ref 0.0–0.2)

## 2018-12-07 LAB — GLUCOSE, CAPILLARY
Glucose-Capillary: 154 mg/dL — ABNORMAL HIGH (ref 70–99)
Glucose-Capillary: 216 mg/dL — ABNORMAL HIGH (ref 70–99)
Glucose-Capillary: 180 mg/dL — ABNORMAL HIGH (ref 70–99)
Glucose-Capillary: 216 mg/dL — ABNORMAL HIGH (ref 70–99)

## 2018-12-07 LAB — BASIC METABOLIC PANEL
Anion gap: 10 (ref 5–15)
BUN: 19 mg/dL (ref 8–23)
CO2: 25 mmol/L (ref 22–32)
Calcium: 8.8 mg/dL — ABNORMAL LOW (ref 8.9–10.3)
Chloride: 95 mmol/L — ABNORMAL LOW (ref 98–111)
Creatinine, Ser: 1.22 mg/dL (ref 0.61–1.24)
GFR calc Af Amer: >60 mL/min (ref >60)
GFR calc non Af Amer: 52 mL/min — ABNORMAL LOW (ref >60)
Glucose, Bld: 144 mg/dL — ABNORMAL HIGH (ref 70–99)
Potassium: 3.8 mmol/L (ref 3.5–5.1)
Sodium: 130 mmol/L — ABNORMAL LOW (ref 135–145)

## 2018-12-07 MED ORDER — VITAMIN B-12 100 MCG PO TABS
100.0000 ug | ORAL_TABLET | Freq: Every day | ORAL | Status: DC
  Administered 2018-12-07 – 2018-12-09 (×3): 100 ug via ORAL
  Filled 2018-12-07 (×3): qty 1

## 2018-12-07 MED ORDER — TRAMADOL HCL 50 MG PO TABS
50.0000 mg | ORAL_TABLET | Freq: Four times a day (QID) | ORAL | Status: DC | PRN
  Administered 2018-12-07 – 2018-12-09 (×3): 50 mg via ORAL
  Filled 2018-12-07 (×5): qty 1

## 2018-12-07 NOTE — Progress Notes (Signed)
Physical Therapy Treatment Patient Details Name: [name] MRN: [id number] DOB: Dec 25, 1927 Today's Date: 12/07/2018    History of Present Illness 85 year old past medical history significant for HTN, hyperlipidemia, diabetes type 2, chronic kidney disease a stage III, PE, A. fib, diastolic heart failure, right BKA admitted from home with with black discoloration of first toe on the left foot.  Now s/p L transmetatarsal amputation of L foot    PT Comments    Focused session on strengthening of LE and trunk control today.  He was able to sit in unsupported sitting on EOB for a total of 13 mins and progressed to no UE support with min/guard.  As he fatigued he began to have R lateral lean, but no LOB.  Con't to recommend CIR.   Follow Up Recommendations  CIR;Supervision/Assistance - 24 hour     Equipment Recommendations  None recommended by PT    Recommendations for Other Services       Precautions / Restrictions Precautions Precautions: Fall Precaution Comments: wound vac L foot, preexisting R BKA Required Braces or Orthoses: Other Brace Other Brace: cast shoe to protect L foot Restrictions Weight Bearing Restrictions: Yes LLE Weight Bearing: Non weight bearing    Mobility  Bed Mobility Overal bed mobility: Needs Assistance Bed Mobility: Supine to Sit Rolling: Mod assist   Supine to sit: Max assist;HOB elevated Sit to supine: Total assist;+2 for physical assistance   General bed mobility comments: Cues for hand placement with bed rail.MAX of 1 to get to EOB, but with return supine, he needed +2 A of PT and son  Transfers                 General transfer comment: NT today  Ambulation/Gait                 Stairs             Wheelchair Mobility    Modified Rankin (Stroke Patients Only)       Balance Overall balance assessment: Needs assistance Sitting-balance support: Single extremity supported;No upper extremity supported;Bilateral  upper extremity supported Sitting balance-Leahy Scale: Fair Sitting balance - Comments: Worked on progresing from B UE support to unilateral to no UE support.  Performed reaching slightly out of BOS and general trunk weight shifts. He was able to sit 13 mins, but then began to fatigue with R lateral lean. Postural control: Right lateral lean(as he fatigued)                                  Cognition Arousal/Alertness: Awake/alert Behavior During Therapy: WFL for tasks assessed/performed Overall Cognitive Status: Within Functional Limits for tasks assessed                                        Exercises General Exercises - Lower Extremity Short Arc Quad: Strengthening;Both;10 reps;Supine Amputee Exercises Gluteal Sets: Strengthening;10 reps;Supine Towel Squeeze: Strengthening;Both;10 reps;Supine    General Comments General comments (skin integrity, edema, etc.): son entered mid-way through session, while seated EOB in unsupported sitting      Pertinent Vitals/Pain Pain Assessment: Faces Faces Pain Scale: Hurts little more Pain Location: L foot when sitting EOB Pain Descriptors / Indicators: Throbbing Pain Intervention(s): Limited activity within patient's tolerance;Monitored during session;Patient requesting pain meds-RN notified    Home Living  Prior Function            PT Goals (current goals can now be found in the care plan section) Acute Rehab PT Goals PT Goal Formulation: With patient/family Time For Goal Achievement: 12/11/18 Potential to Achieve Goals: Fair Progress towards PT goals: Progressing toward goals    Frequency    Min 3X/week      PT Plan Current plan remains appropriate    Co-evaluation              AM-PAC PT "6 Clicks" Mobility   Outcome Measure  Help needed turning from your back to your side while in a flat bed without using bedrails?: A Lot Help needed moving from lying  on your back to sitting on the side of a flat bed without using bedrails?: Total Help needed moving to and from a bed to a chair (including a wheelchair)?: Total Help needed standing up from a chair using your arms (e.g., wheelchair or bedside chair)?: Total Help needed to walk in hospital room?: Total Help needed climbing 3-5 steps with a railing? : Total 6 Click Score: 7    End of Session   Activity Tolerance: Patient tolerated treatment well Patient left: in bed;with call bell/phone within reach;with bed alarm set;with family/visitor present Nurse Communication: Mobility status;Patient requests pain meds PT Visit Diagnosis: Muscle weakness (generalized) (M62.81);Difficulty in walking, not elsewhere classified (R26.2);Adult, failure to thrive (R62.7);Pain Pain - Right/Left: Left Pain - part of body: Ankle and joints of foot     Time: 3817-7116 PT Time Calculation (min) (ACUTE ONLY): 35 min  Charges:  $Therapeutic Exercise: 8-22 mins $Therapeutic Activity: 8-22 mins                     [name] L. Tamala Julian, [place] Pager [phone] 12/07/2018    [name] 12/07/2018, 11:07 AM

## 2018-12-07 NOTE — Progress Notes (Addendum)
PROGRESS NOTE    [name]  ENI:[id number] DOB: 03-29-1927 DOA: 11/18/2018 PCP: Redmond School, MD   Brief Narrative: 85 year old past medical history significant for hypertension, hyperlipidemia, diabetes type 2, chronic kidney disease a stage III, PE, A. fib, diastolic heart failure, PAD status post right BKA admitted from home with with black discoloration of first toe on the left foot.  X-ray show evidence of questionable osteomyelitis but reviewing final x-ray, no osteomyelitis was detected.  Patient was admitted on November  for for further management of dry gangrene and cellulitis of the left foot. Patient was a started on IV vancomycin and Zosyn.    Assessment & Plan:   Principal Problem:   Toe infection Active Problems:   Peripheral vascular disease (HCC)   Essential hypertension, benign   Hyperlipidemia   Moderate aortic stenosis   Hypothyroidism   PAF (paroxysmal atrial fibrillation) (HCC)   Type 2 diabetes mellitus with foot ulcer, without long-term current use of insulin (HCC)   Gangrene of left foot (HCC)   Critical limb ischemia with history of revascularization of same extremity  1-First left toe with dry gangrene/ischemic ulcer and cellulitis in the setting of peripheral vascular disease with critical limb ischemia of the left great toe: Patient was a started on IV antibiotics. Blood cultures x 2 no growth. Status post successful complex revascularization of the left TP trunk into the peroneal artery using 2 overlapping drug-eluting stent as well as drug-coated balloon angioplasty of the distal left popliteal artery. Continue with Plavix.  Discussed with Dr Sharol Given ok to resume eliquis.  aspirin stop as recommend by cardiology.  S/P  transmetatarsal amputation, 12/02/2018. Completed antibiotics. Today he has received one dose of IV morphine. Will increase tramadol.  CIR consulted, I did Peer to peer today, he was decline. Will ask SW to start process for SNF as  well.   2-Contact With COVID postive person;  -Per patient's son, his sister was diagnosed with Covid 41 on Nov 18, last time she was in contact with Mr Minder was Nov 15. She was wearing a mask.  -Patient is asymptomatic currently. COVID 19 repeated 11-20 negative -Discussed with Staff, continue to use face mask and face shield for patient contact.   3-Hypertension: Continue with Lasix and amlodipine  4-Hyperlipidemia: Continue Lipitor.  5-Hypothyroidism: Continue with Synthroid  Leukocytosis; follow trend, afebrile.   Chronic kidney disease a stage IIIb: Creatinine baseline 1.5--1.7.  Stable Lasix was resumed lower dose   Anemia: iron deficiency anemia.  Questionable of delusional. Monitor hemoglobin. Low iron, low normal B 12. B 12 injection, oral iron supplement ordered.  Hemoglobin stable at 10.  Diabetes type 2 with foot ulcer: Hold glipizide Continue sliding scale insulin  BPH: Continue Flomax and finasteride.  Pressure injury left heel stage III: Present on admission, local care.   Diarrhea; hold laxatives. Monitor for further symptoms.   Pressure Injury 09/10/18 Heel Left Stage II -  Partial thickness loss of dermis presenting as a shallow open ulcer with a red, pink wound bed without slough. was charted on 09/10/18 as a stage 1 under other wound column, now is a stage 2/open blister (Active)  09/10/18 1718  Location: Heel  Location Orientation: Left  Staging: Stage II -  Partial thickness loss of dermis presenting as a shallow open ulcer with a red, pink wound bed without slough.  Wound Description (Comments): was charted on 09/10/18 as a stage 1 under other wound column, now is a stage 2/open blister  Present  on Admission: Yes     Estimated body mass index is 29.77 kg/m as calculated from the following:   Height as of this encounter: 5\' 10"  (1.778 m).   Weight as of this encounter: 94.1 kg.   DVT prophylaxis:  Code Status: DNR Family Communication: Care  discussed with patient and Son who was at bedside 11-20 Disposition Plan: insurance decline CIR, claim submitted again. Will ask SW to start working on SNF Consultants:   Dr. Sharol Given  Cardiology  Procedures:   Transmetatarsal amputation 11/18  Antimicrobials:    Subjective: He is feeling well, pain better controlled.  Diarrhea improving.   Objective: Vitals:   12/06/18 1400 12/06/18 2120 12/07/18 0350 12/07/18 1224  BP: (!) 129/55 140/60 (!) 148/65 (!) 133/59  Pulse: 64 (!) 59 (!) 59 64  Resp:  14 16 14   Temp: 98.3 F (36.8 C) 98.2 F (36.8 C) 98.3 F (36.8 C) 98.7 F (37.1 C)  TempSrc: Oral Oral Oral Oral  SpO2: 100% 97% 97% 97%  Weight:   94.1 kg   Height:        Intake/Output Summary (Last 24 hours) at 12/07/2018 1353 Last data filed at 12/07/2018 1329 Gross per 24 hour  Intake 480 ml  Output 1900 ml  Net -1420 ml   Filed Weights   12/05/18 0423 12/06/18 0340 12/07/18 0350  Weight: 97.5 kg 96.8 kg 94.1 kg    Examination:  General exam: NAD Respiratory system: CTA Cardiovascular system: S 1, S  2 RRR Gastrointestinal system: BS present, soft, nt Central nervous system: Non focal.  Extremities: Right BKA, left LE S/P transmetatarsal amputation with wound vac in place.    Data Reviewed: I have personally reviewed following labs and imaging studies  CBC: Recent Labs  Lab 12/02/18 0828 12/03/18 0822 12/04/18 0341 12/05/18 0819 12/07/18 0743  WBC 9.0 9.1 8.8 9.9 13.0*  HGB 10.0* 10.0* 9.9* 10.1* 9.7*  HCT 29.1* 29.7* 30.1* 29.9* 29.1*  MCV 90.4 91.4 92.9 91.7 92.1  PLT 256 294 278 279 384   Basic Metabolic Panel: Recent Labs  Lab 12/02/18 0828 12/03/18 0822 12/04/18 0341 12/05/18 0819 12/07/18 0743  NA 134* 133* 134* 132* 130*  K 3.7 3.8 4.2 4.1 3.8  CL 103 99 100 99 95*  CO2 21* 24 24 23 25   GLUCOSE 231* 143* 130* 145* 144*  BUN 17 17 18 17 19   CREATININE 1.17 1.23 1.29* 1.20 1.22  CALCIUM 8.5* 8.6* 9.0 8.8* 8.8*   GFR: Estimated  Creatinine Clearance: 47.3 mL/min (by C-G formula based on SCr of 1.22 mg/dL). Liver Function Tests: No results for input(s): AST, ALT, ALKPHOS, BILITOT, PROT, ALBUMIN in the last 168 hours. No results for input(s): LIPASE, AMYLASE in the last 168 hours. No results for input(s): AMMONIA in the last 168 hours. Coagulation Profile: No results for input(s): INR, PROTIME in the last 168 hours. Cardiac Enzymes: No results for input(s): CKTOTAL, CKMB, CKMBINDEX, TROPONINI in the last 168 hours. BNP (last 3 results) No results for input(s): PROBNP in the last 8760 hours. HbA1C: No results for input(s): HGBA1C in the last 72 hours. CBG: Recent Labs  Lab 12/06/18 0758 12/06/18 1617 12/06/18 2114 12/07/18 0747 12/07/18 1126  GLUCAP 122* 145* 197* 154* 216*   Lipid Profile: No results for input(s): CHOL, HDL, LDLCALC, TRIG, CHOLHDL, LDLDIRECT in the last 72 hours. Thyroid Function Tests: No results for input(s): TSH, T4TOTAL, FREET4, T3FREE, THYROIDAB in the last 72 hours. Anemia Panel: No results for input(s): VITAMINB12, FOLATE,  FERRITIN, TIBC, IRON, RETICCTPCT in the last 72 hours. Sepsis Labs: No results for input(s): PROCALCITON, LATICACIDVEN in the last 168 hours.  Recent Results (from the past 240 hour(s))  SARS CORONAVIRUS 2 (TAT 6-24 HRS) Nasopharyngeal Nasopharyngeal Swab     Status: None   Collection Time: 12/04/18  3:15 PM   Specimen: Nasopharyngeal Swab  Result Value Ref Range Status   SARS Coronavirus 2 NEGATIVE NEGATIVE Final    Comment: (NOTE) SARS-CoV-2 target nucleic acids are NOT DETECTED. The SARS-CoV-2 RNA is generally detectable in upper and lower respiratory specimens during the acute phase of infection. Negative results do not preclude SARS-CoV-2 infection, do not rule out co-infections with other pathogens, and should not be used as the sole basis for treatment or other patient management decisions. Negative results must be combined with clinical  observations, patient history, and epidemiological information. The expected result is Negative. Fact Sheet for Patients: SugarRoll.be Fact Sheet for Healthcare Providers: https://www.woods-mathews.com/ This test is not yet approved or cleared by the [place] FDA and  has been authorized for detection and/or diagnosis of SARS-CoV-2 by FDA under an Emergency Use Authorization (EUA). This EUA will remain  in effect (meaning this test can be used) for the duration of the COVID-19 declaration under Section 56 4(b)(1) of the Act, 21 U.S.C. section 360bbb-3(b)(1), unless the authorization is terminated or revoked sooner. Performed at [facility], [place] [address], [place], [place] [address]          Radiology Studies: No results found.      Scheduled Meds: . acetaminophen  500 mg Oral TID  . amLODipine  10 mg Oral Daily  . apixaban  5 mg Oral BID  . atorvastatin  40 mg Oral Daily  . clopidogrel  75 mg Oral Q breakfast  . collagenase   Topical Daily  . dorzolamide-timolol  1 drop Both Eyes BID  . famotidine  20 mg Oral Daily  . finasteride  5 mg Oral QPM  . furosemide  20 mg Oral Daily  . insulin aspart  0-15 Units Subcutaneous TID WC  . insulin aspart  0-5 Units Subcutaneous QHS  . iron polysaccharides  150 mg Oral Daily  . levothyroxine  50 mcg Oral Q0600  . potassium chloride  10 mEq Oral Daily  . sodium chloride flush  10-40 mL Intracatheter Q12H  . tamsulosin  0.4 mg Oral BID   Continuous Infusions: . sodium chloride 250 mL (11/26/18 1031)  . sodium chloride       LOS: 18 days    Time spent: 35 minutes./      A , MD [facility]   If 7PM-7AM, please contact night-coverage [email or web address] Password [email or web address] 12/07/2018, 1:53 PM

## 2018-12-07 NOTE — TOC Progression Note (Signed)
Transition of Care [facility]) - Progression Note    Patient Details  Name: [name] MRN: [id number] Date of Birth: 08/28/1927  Transition of Care [facility]) CM/SW Contact  Graves-Bigelow, Ocie Cornfield, RN Phone Number: 12/07/2018, 12:42 PM  Clinical Narrative: CM will continue to follow for progression of care needs. CIR is following the patient- CIR to begin the appeals process. CM will continue to follow for transition of care needs.     Expected Discharge Plan: [place] Barriers to Discharge: Continued Medical Work up  Expected Discharge Plan and Services Expected Discharge Plan: [place] In-house Referral: NA Discharge Planning Services: CM Consult Post Acute Care Choice: Home Health, Resumption of Svcs/PTA Provider(Active with [facility]) Living arrangements for the past 2 months: [place]: RN [place] Agency: [place] ([place]) Date [place]: 11/26/18 Time [place]: 44 Representative spoke with at [place]: [place] ([place]) Interventions    Readmission Risk Interventions Readmission Risk Prevention Plan 11/26/2018 11/21/2018 11/12/2018  Transportation Screening Complete Complete -  PCP or Specialist Appt within 3-5 Days - - -  Not Complete comments - - -  HRI or [place] or Home Care Consult comments - - -  Social Work Scientific laboratory technician for [place] Planning/Counseling - - -  Palliative Care Screening - - -  Medication Review Press photographer) Complete Referral to Pharmacy -  PCP or Specialist appointment within 3-5 days of discharge Complete Complete Complete  HRI or Home Care Consult Complete Complete -  SW Recovery Care/Counseling Consult Complete Complete -  Palliative Care Screening Not Applicable Not Applicable -  [place] Not Applicable Not Complete Not Applicable  Some recent data might be  hidden

## 2018-12-07 NOTE — Progress Notes (Signed)
VSS afebrile Dressing in place. Still about 50 cc output. Not complaining of pain  Will need follow up Dr. Sharol Given 1 week

## 2018-12-07 NOTE — Care Management Important Message (Signed)
Important Message  Patient Details  Name: [name] MRN: [id number] Date of Birth: 01/06/1928   Medicare Important Message Given:  Yes     Shelda Altes 12/07/2018, 12:11 PM

## 2018-12-07 NOTE — Progress Notes (Signed)
Inpatient Rehabilitation Admissions Coordinator  Insurance has given initial denial for CIR admit. I met with patient at bedside with his son and will begin appeal process.   Danne Baxter, RN, MSN Rehab Admissions Coordinator [phone] 12/07/2018 12:01 PM

## 2018-12-08 ENCOUNTER — Inpatient Hospital Stay (HOSPITAL_COMMUNITY): Payer: Medicare Other

## 2018-12-08 ENCOUNTER — Ambulatory Visit: Payer: Medicare Other | Admitting: Cardiovascular Disease

## 2018-12-08 LAB — CBC
HCT: 27.4 % — ABNORMAL LOW (ref 39.0–52.0)
Hemoglobin: 9.2 g/dL — ABNORMAL LOW (ref 13.0–17.0)
MCH: 30.4 pg (ref 26.0–34.0)
MCHC: 33.6 g/dL (ref 30.0–36.0)
MCV: 90.4 fL (ref 80.0–100.0)
Platelets: 246 10*3/uL (ref 150–400)
RBC: 3.03 MIL/uL — ABNORMAL LOW (ref 4.22–5.81)
RDW: 16.2 % — ABNORMAL HIGH (ref 11.5–15.5)
WBC: 13.1 10*3/uL — ABNORMAL HIGH (ref 4.0–10.5)
nRBC: 0 % (ref 0.0–0.2)

## 2018-12-08 LAB — URINALYSIS, ROUTINE W REFLEX MICROSCOPIC
Bilirubin Urine: NEGATIVE
Glucose, UA: NEGATIVE mg/dL
Hgb urine dipstick: NEGATIVE
Ketones, ur: NEGATIVE mg/dL
Nitrite: NEGATIVE
Protein, ur: NEGATIVE mg/dL
Specific Gravity, Urine: 1.01 (ref 1.005–1.030)
pH: 6 (ref 5.0–8.0)

## 2018-12-08 LAB — GLUCOSE, CAPILLARY
Glucose-Capillary: 127 mg/dL — ABNORMAL HIGH (ref 70–99)
Glucose-Capillary: 153 mg/dL — ABNORMAL HIGH (ref 70–99)
Glucose-Capillary: 185 mg/dL — ABNORMAL HIGH (ref 70–99)
Glucose-Capillary: 187 mg/dL — ABNORMAL HIGH (ref 70–99)
Glucose-Capillary: 218 mg/dL — ABNORMAL HIGH (ref 70–99)

## 2018-12-08 MED ORDER — FUROSEMIDE 10 MG/ML IJ SOLN
20.0000 mg | Freq: Once | INTRAMUSCULAR | Status: AC
  Administered 2018-12-08: 20 mg via INTRAVENOUS
  Filled 2018-12-08: qty 2

## 2018-12-08 MED ORDER — SODIUM CHLORIDE 0.9 % IV SOLN
1.0000 g | INTRAVENOUS | Status: DC
  Administered 2018-12-08 – 2018-12-09 (×2): 1 g via INTRAVENOUS
  Filled 2018-12-08: qty 10
  Filled 2018-12-08: qty 1

## 2018-12-08 MED ORDER — SODIUM CHLORIDE 0.9 % IV SOLN: 1.0000 g | INTRAVENOUS | Status: DC

## 2018-12-08 MED ORDER — AZITHROMYCIN 250 MG PO TABS
500.0000 mg | ORAL_TABLET | ORAL | Status: DC
  Administered 2018-12-08 – 2018-12-09 (×2): 500 mg via ORAL
  Filled 2018-12-08 (×2): qty 2

## 2018-12-08 NOTE — Progress Notes (Signed)
PROGRESS NOTE    [name]  HAL:[id number] DOB: 10-Dec-1927 DOA: 11/18/2018 PCP: Travis School, MD   Brief Narrative: 85 year old past medical history significant for hypertension, hyperlipidemia, diabetes type 2, chronic kidney disease a stage III, PE, A. fib, diastolic heart failure, PAD status post right BKA admitted from home with with black discoloration of first toe on the left foot.  X-ray show evidence of questionable osteomyelitis but reviewing final x-ray, no osteomyelitis was detected.  Patient was admitted on November  for for further management of dry gangrene and cellulitis of the left foot. Patient was a started on IV vancomycin and Zosyn.  Patient underwent  successful complex revascularization of the left TP trunk into the peroneal artery using 2 overlapping drug-eluting stent as well as drug-coated balloon angioplasty of the distal left popliteal artery.  He subsequently require transmetatarsal amputation performed on 12/02/2018. He currently have a wound VAC.  Antibiotics was discontinue post surgery per Ortho recommendation.  Patient report some intermittent cough, developed leukocytosis.  Chest x-ray with worsening infiltrates versus edema.  Will start IV ceftriaxone and azithromycin and will give extra dose of IV Lasix today.     Assessment & Plan:   Principal Problem:   Toe infection Active Problems:   Peripheral vascular disease (HCC)   Essential hypertension, benign   Hyperlipidemia   Moderate aortic stenosis   Hypothyroidism   PAF (paroxysmal atrial fibrillation) (HCC)   Type 2 diabetes mellitus with foot ulcer, without long-term current use of insulin (HCC)   Gangrene of left foot (HCC)   Critical limb ischemia with history of revascularization of same extremity  1-First left toe with dry gangrene/ischemic ulcer and cellulitis in the setting of peripheral vascular disease with critical limb ischemia of the left great toe: Patient was a started on IV  antibiotics. Blood cultures x 2 no growth. Status post successful complex revascularization of the left TP trunk into the peroneal artery using 2 overlapping drug-eluting stent as well as drug-coated balloon angioplasty of the distal left popliteal artery. Continue with Plavix.  Discussed with Dr Sharol Given ok to resume eliquis.  aspirin stop as recommend by cardiology.  S/P  transmetatarsal amputation, 12/02/2018. Completed antibiotics. He has not required IV morphine for the last 24 hours.  Continue with tramadol for pain.  Avoid oxycodone because make patient confused Insurance declined CIR.  2-Contact With COVID postive person;  -Per patient's son, his sister was diagnosed with Covid 58 on Nov 18, last time she was in contact with [name] was Nov 15. She was wearing a mask.  -Patient is asymptomatic. COVID 19 repeated 11-20 negative -Discussed with Staff, continue to use face mask and face shield for patient contact.   3-pneumonia: Patient develop leukocytosis, chest x-ray with infiltrate or edema. Will start IV ceftriaxone and azithromycin. We will give extra dose of IV Lasix.   Hypertension: Continue with Lasix and amlodipine  4-Hyperlipidemia: Continue Lipitor.  5-Hypothyroidism: Continue with Synthroid  Leukocytosis; follow trend, afebrile.   Chronic kidney disease a stage IIIb: Creatinine baseline 1.5--1.7.  Stable Lasix was resumed lower dose   Anemia: iron deficiency anemia.  Questionable of delusional. Monitor hemoglobin. Low iron, low normal B 12. B 12 injection, oral iron supplement ordered.  Hemoglobin stable at 10.  Diabetes type 2 with foot ulcer: Hold glipizide Continue sliding scale insulin  BPH: Continue Flomax and finasteride.  Pressure injury left heel stage III: Present on admission, local care.   Diarrhea; hold laxatives. Monitor for further symptoms.  Pressure Injury 09/10/18 Heel Left Stage II -  Partial thickness loss of dermis presenting as a  shallow open ulcer with a red, pink wound bed without slough. was charted on 09/10/18 as a stage 1 under other wound column, now is a stage 2/open blister (Active)  09/10/18 1718  Location: Heel  Location Orientation: Left  Staging: Stage II -  Partial thickness loss of dermis presenting as a shallow open ulcer with a red, pink wound bed without slough.  Wound Description (Comments): was charted on 09/10/18 as a stage 1 under other wound column, now is a stage 2/open blister  Present on Admission: Yes     Estimated body mass index is 29.83 kg/m as calculated from the following:   Height as of this encounter: 5\' 10"  (5.681 m).   Weight as of this encounter: 94.3 kg.   DVT prophylaxis:  Code Status: DNR Family Communication: Care discussed with patient and Son who was at bedside 11-20 Disposition Plan: insurance decline CIR, claim submitted again. Will ask SW to start working on SNF.  Developed leukocytosis today we will start her IV antibiotics to cover for pneumonia.  Consultants:   Dr. Sharol Given  Cardiology  Procedures:   Transmetatarsal amputation 11/18  Antimicrobials:    Subjective: Report cough intermittently.  Denies dyspnea.  Diarrhea has improved.  Pain left foot is better.  Objective: Vitals:   12/07/18 1224 12/07/18 2119 12/08/18 0455 12/08/18 0815  BP: (!) 133/59 (!) 135/57 (!) 126/54 (!) 146/76  Pulse: 64 69 60 66  Resp: 14 (!) 24 14 16   Temp: 98.7 F (37.1 C) 98.7 F (37.1 C) 98.1 F (36.7 C) 98.6 F (37 C)  TempSrc: Oral Oral Oral Oral  SpO2: 97% 95% 94% 93%  Weight:   94.3 kg   Height:        Intake/Output Summary (Last 24 hours) at 12/08/2018 1409 Last data filed at 12/08/2018 1054 Gross per 24 hour  Intake 330 ml  Output 1600 ml  Net -1270 ml   Filed Weights   12/06/18 0340 12/07/18 0350 12/08/18 0455  Weight: 96.8 kg 94.1 kg 94.3 kg    Examination:  General exam: NAD Respiratory system: Crackles at the bases Cardiovascular system: S1,  S2 regular rhythm and rate Gastrointestinal system: Bowel sounds present, soft nontender nondistended Central nervous system; nonfocal Extremities: Right BKA, left LE S/P transmetatarsal amputation with wound vac in place.    Data Reviewed: I have personally reviewed following labs and imaging studies  CBC: Recent Labs  Lab 12/03/18 0822 12/04/18 0341 12/05/18 0819 12/07/18 0743 12/08/18 0353  WBC 9.1 8.8 9.9 13.0* 13.1*  HGB 10.0* 9.9* 10.1* 9.7* 9.2*  HCT 29.7* 30.1* 29.9* 29.1* 27.4*  MCV 91.4 92.9 91.7 92.1 90.4  PLT 294 278 279 270 275   Basic Metabolic Panel: Recent Labs  Lab 12/02/18 0828 12/03/18 0822 12/04/18 0341 12/05/18 0819 12/07/18 0743  NA 134* 133* 134* 132* 130*  K 3.7 3.8 4.2 4.1 3.8  CL 103 99 100 99 95*  CO2 21* 24 24 23 25   GLUCOSE 231* 143* 130* 145* 144*  BUN 17 17 18 17 19   CREATININE 1.17 1.23 1.29* 1.20 1.22  CALCIUM 8.5* 8.6* 9.0 8.8* 8.8*   GFR: Estimated Creatinine Clearance: 47.3 mL/min (by C-G formula based on SCr of 1.22 mg/dL). Liver Function Tests: No results for input(s): AST, ALT, ALKPHOS, BILITOT, PROT, ALBUMIN in the last 168 hours. No results for input(s): LIPASE, AMYLASE in the last 168 hours.  No results for input(s): AMMONIA in the last 168 hours. Coagulation Profile: No results for input(s): INR, PROTIME in the last 168 hours. Cardiac Enzymes: No results for input(s): CKTOTAL, CKMB, CKMBINDEX, TROPONINI in the last 168 hours. BNP (last 3 results) No results for input(s): PROBNP in the last 8760 hours. HbA1C: No results for input(s): HGBA1C in the last 72 hours. CBG: Recent Labs  Lab 12/07/18 1126 12/07/18 1628 12/07/18 2121 12/08/18 0744 12/08/18 1139  GLUCAP 216* 180* 216* 127* 185*   Lipid Profile: No results for input(s): CHOL, HDL, LDLCALC, TRIG, CHOLHDL, LDLDIRECT in the last 72 hours. Thyroid Function Tests: No results for input(s): TSH, T4TOTAL, FREET4, T3FREE, THYROIDAB in the last 72 hours. Anemia  Panel: No results for input(s): VITAMINB12, FOLATE, FERRITIN, TIBC, IRON, RETICCTPCT in the last 72 hours. Sepsis Labs: No results for input(s): PROCALCITON, LATICACIDVEN in the last 168 hours.  Recent Results (from the past 240 hour(s))  SARS CORONAVIRUS 2 (TAT 6-24 HRS) Nasopharyngeal Nasopharyngeal Swab     Status: None   Collection Time: 12/04/18  3:15 PM   Specimen: Nasopharyngeal Swab  Result Value Ref Range Status   SARS Coronavirus 2 NEGATIVE NEGATIVE Final    Comment: (NOTE) SARS-CoV-2 target nucleic acids are NOT DETECTED. The SARS-CoV-2 RNA is generally detectable in upper and lower respiratory specimens during the acute phase of infection. Negative results do not preclude SARS-CoV-2 infection, do not rule out co-infections with other pathogens, and should not be used as the sole basis for treatment or other patient management decisions. Negative results must be combined with clinical observations, patient history, and epidemiological information. The expected result is Negative. Fact Sheet for Patients: SugarRoll.be Fact Sheet for Healthcare Providers: https://www.woods-mathews.com/ This test is not yet approved or cleared by the [place] FDA and  has been authorized for detection and/or diagnosis of SARS-CoV-2 by FDA under an Emergency Use Authorization (EUA). This EUA will remain  in effect (meaning this test can be used) for the duration of the COVID-19 declaration under Section 56 4(b)(1) of the Act, 21 U.S.C. section 360bbb-3(b)(1), unless the authorization is terminated or revoked sooner. Performed at [facility], [place] [address] [place], [place] [address]          Radiology Studies: Dg Chest Port 1 View  Result Date: 12/08/2018 CLINICAL DATA:  Leukocytosis. EXAM: PORTABLE CHEST 1 VIEW COMPARISON:  11/18/2018. FINDINGS: Stable cardiomegaly. Bibasilar pulmonary infiltrates/edema, progressed from prior  exam. Bilateral moderate pleural effusions again noted. No pneumothorax. IMPRESSION: Stable cardiomegaly. Bibasilar pulmonary infiltrates/edema, progressed from prior exam. Progressive CHF and/or pneumonia could present this fashion. Bilateral moderate pleural effusions again noted without interim change. Electronically Signed   By: Marcello Moores  Register   On: 12/08/2018 09:46        Scheduled Meds: . acetaminophen  500 mg Oral TID  . amLODipine  10 mg Oral Daily  . apixaban  5 mg Oral BID  . atorvastatin  40 mg Oral Daily  . azithromycin  500 mg Oral Daily  . clopidogrel  75 mg Oral Q breakfast  . collagenase   Topical Daily  . dorzolamide-timolol  1 drop Both Eyes BID  . famotidine  20 mg Oral Daily  . finasteride  5 mg Oral QPM  . furosemide  20 mg Intravenous Once  . furosemide  20 mg Oral Daily  . insulin aspart  0-15 Units Subcutaneous TID WC  . insulin aspart  0-5 Units Subcutaneous QHS  . iron polysaccharides  150 mg Oral  Daily  . levothyroxine  50 mcg Oral Q0600  . potassium chloride  10 mEq Oral Daily  . sodium chloride flush  10-40 mL Intracatheter Q12H  . tamsulosin  0.4 mg Oral BID  . vitamin B-12  100 mcg Oral Daily   Continuous Infusions: . sodium chloride 250 mL (11/26/18 1031)  . sodium chloride    . cefTRIAXone (ROCEPHIN)  IV       LOS: 19 days    Time spent: 35 minutes./     Aslan Montagna A Alexzander Dolinger, MD [facility]   If 7PM-7AM, please contact night-coverage [email or web address] Password [email or web address] 12/08/2018, 2:09 PM

## 2018-12-08 NOTE — PMR Pre-admission (Signed)
PMR Admission Coordinator Pre-Admission Assessment  Patient: NICKY KRAS is an 85 y.o., male MRN: [id number] DOB: [birth year] Height: _0  (177.8 cm) Weight: 94.6 kg  Insurance Information HMO: yes    PPO:      PCP:      IPA:      80/20:      OTHER: medicare advantage plan PRIMARY: National Medicare      Policy#: [id number]      Subscriber: pt CM Name: [facility] [place]   [facility] [place]    Phone#: [phone] option #3     Fax#: [phone] Pre-Cert#: [id number] initial denied and overturned on expedited appeal with Myrtice Lauth  Phone [phone] fax [phone] approved for 7 days with updates to [facility]      Employer:  Benefits:  Phone #: [phone]     Name: 11/20 Eff. Date: 01/14/2018     Deduct: none      Out of Pocket Max: $3600      Life Max: none CIR: $295 co pay per day days 1 until 5      SNF: no co pay days 1 until 20; $160 co pay per day days 21 until 43; no co pay days 44 until 100 Outpatient: $30 co py per visit     Co-Pay: visits per medical neccesity Home Health: 100%      Co-Pay: visits per medical neccesity DME: 80%     Co-Pay: 20% Providers: in network  SECONDARY: none       Medicaid Application Date:       Case Manager:  Disability Application Date:       Case Worker:   The "Data Collection Information Summary" for patients in Inpatient Rehabilitation Facilities with attached "Privacy Act [place] Records" was provided and verbally reviewed with: Patient and Family  Emergency Contact Information Contact Information    Name Relation Home Work [place], [place] Son   [phone]   [name] Daughter   [phone]   Myers,Betty Sister [phone]     Efrain, Clauson   [phone]      Current Medical History  Patient Admitting Diagnosis: Left TMA; old R BKA  History of Present Illness: 85 year old right-handed male well-known to rehab services with CIR admission 09/07/2018 until 09/22/2018 for right BKA secondary to peripheral vascular  disease and was discharged home requiring minimal assist for mobility as well as history of diabetes mellitus, diastolic congestive heart failure, CKD stage III, PAF maintained on Eliquis followed by Dr. Debara Pickett, hypertension, hyperlipidemia.  Family was using a lift to help aid in transfers at home.  Presented 11/18/2018 with black discoloration of first toe left foot.  Patient had been followed at the wound care center.  Noted progressive redness of the dorsum of the left foot as well as skin ulcer 2 to 3 mm on the lateral aspect of the left foot.  X-rays revealed no acute osseous abnormality in the left foot.  Admission chemistries with sodium 132, BUN 31, creatinine 1.27, hemoglobin 11.5, WBC 10,800, lactic acid within normal limits 1.1, sedimentation rate elevated at 84, blood cultures no growth to date.  Patient was started on IV vancomycin and Zosyn.  Follow-up orthopedic services Dr. Sharol Given for gangrenous left foot and limb not felt to be salvageable.  Patient received cardiac clearance and underwent left transmetatarsal amputation and placement of wound VAC 12/02/2018 per Dr. Sharol Given.  Hospital course pain management.  Postoperative Eliquis resumed as well as Plavix.  His low-dose aspirin was discontinued  after discussing with cardiology services.  Acute blood loss anemia 9.9 and monitored.  Wound Vac removed 11/25. He did develop leucocytosis with cough and CXR 11/24 showed progressive CHF/PNA. He was treated with IV diuresis 11/24 and was started on broad spectrum antibiotics due to concerns of PNA. Diarrhea improving off laxatives. Of note, patient did have contact with his daughter 11/15 and she tested COVID positive on 11/18--> he was tested on 11/20 and is COVID negative.      Patient's medical record from [facility] has been reviewed by the rehabilitation admission coordinator and physician.  Past Medical History  Past Medical History:  Diagnosis Date  . Arthritis   . Borderline diabetic    . Diabetes mellitus without complication ([place])   . Glaucoma   . Hyperlipidemia   . Hypertension   . PVD (peripheral vascular disease) ([place])   . Sleep apnea    Cpap ordered but doesnt use    Family History   family history includes Cancer in his mother; Heart attack in his brother, brother, and father; Stroke in his father.  Prior Rehab/Hospitalizations Has the patient had prior rehab or hospitalizations prior to admission? Yes  CIR 08/2018 Dr. Florentina Jenny Team  Has the patient had major surgery during 100 days prior to admission? Yes   Current Medications  Current Facility-Administered Medications:  .  0.9 %  sodium chloride infusion, 250 mL, Intravenous, PRN, Persons, Bevely Palmer, PA, Last Rate: 10 mL/hr at 11/26/18 1031, 250 mL at 11/26/18 1031 .  0.9 %  sodium chloride infusion, 250 mL, Intravenous, PRN, Persons, Bevely Palmer, PA .  acetaminophen (TYLENOL) tablet 500 mg, 500 mg, Oral, TID, Regalado, Belkys A, MD, 500 mg at 12/09/18 0906 .  acetaminophen (TYLENOL) tablet 650 mg, 650 mg, Oral, Q4H PRN, Persons, Bevely Palmer, [place], 650 mg at 12/08/18 1855 .  amLODipine (NORVASC) tablet 10 mg, 10 mg, Oral, Daily, Persons, Bevely Palmer, PA, 10 mg at 12/09/18 1110 .  apixaban (ELIQUIS) tablet 5 mg, 5 mg, Oral, BID, Regalado, Belkys A, MD, 5 mg at 12/09/18 1111 .  atorvastatin (LIPITOR) tablet 40 mg, 40 mg, Oral, Daily, Persons, Bevely Palmer, PA, 40 mg at 12/09/18 1110 .  azithromycin (ZITHROMAX) tablet 500 mg, 500 mg, Oral, Q24H, Regalado, Belkys A, MD, 500 mg at 12/08/18 1523 .  cefTRIAXone (ROCEPHIN) 1 g in sodium chloride 0.9 % 100 mL IVPB, 1 g, Intravenous, Q24H, Regalado, Belkys A, MD, Stopped at 12/08/18 1620 .  clopidogrel (PLAVIX) tablet 75 mg, 75 mg, Oral, Q breakfast, Persons, Bevely Palmer, [place], 75 mg at 12/09/18 0902 .  dorzolamide-timolol (COSOPT) 22.3-6.8 MG/ML ophthalmic solution 1 drop, 1 drop, Both Eyes, BID, Persons, Bevely Palmer, [place], 1 drop at 12/09/18 1113 .  famotidine (PEPCID) tablet 20 mg,  20 mg, Oral, Daily, Persons, Bevely Palmer, [place], 20 mg at 12/09/18 1111 .  finasteride (PROSCAR) tablet 5 mg, 5 mg, Oral, QPM, Persons, Bevely Palmer, PA, 5 mg at 12/08/18 1715 .  furosemide (LASIX) tablet 20 mg, 20 mg, Oral, Daily, Persons, Bevely Palmer, PA, 20 mg at 12/09/18 1111 .  insulin aspart (novoLOG) injection 0-15 Units, 0-15 Units, Subcutaneous, TID WC, Persons, Bevely Palmer, [place], 3 Units at 12/09/18 1238 .  insulin aspart (novoLOG) injection 0-5 Units, 0-5 Units, Subcutaneous, QHS, Persons, Bevely Palmer, [place], 2 Units at 12/07/18 2205 .  iron polysaccharides (NIFEREX) capsule 150 mg, 150 mg, Oral, Daily, Regalado, Belkys A, MD, 150 mg at 12/09/18 1111 .  levothyroxine (SYNTHROID) tablet 50 mcg,  50 mcg, Oral, Q0600, Persons, Bevely Palmer, [place], 50 mcg at 12/09/18 5277 .  morphine 2 MG/ML injection 1 mg, 1 mg, Intravenous, Q4H PRN, Regalado, Belkys A, MD, 1 mg at 12/09/18 1205 .  ondansetron (ZOFRAN) injection 4 mg, 4 mg, Intravenous, Q6H PRN, Persons, Bevely Palmer, PA .  potassium chloride (KLOR-CON) CR tablet 10 mEq, 10 mEq, Oral, Daily, Persons, Bevely Palmer, [place], 10 mEq at 12/09/18 1112 .  sodium chloride flush (NS) 0.9 % injection 10-40 mL, 10-40 mL, Intracatheter, Q12H, Persons, Bevely Palmer, PA, 10 mL at 12/09/18 1114 .  sodium chloride flush (NS) 0.9 % injection 10-40 mL, 10-40 mL, Intracatheter, PRN, Persons, Bevely Palmer, PA .  tamsulosin (FLOMAX) capsule 0.4 mg, 0.4 mg, Oral, BID, Persons, Bevely Palmer, PA, 0.4 mg at 12/09/18 1112 .  traMADol (ULTRAM) tablet 50 mg, 50 mg, Oral, Q6H PRN, Regalado, Belkys A, MD, 50 mg at 12/09/18 1119 .  vitamin B-12 (CYANOCOBALAMIN) tablet 100 mcg, 100 mcg, Oral, Daily, Regalado, Belkys A, MD, 100 mcg at 12/09/18 1112  Patients Current Diet:  Diet Order            Diet Carb Modified Fluid consistency: Thin; Room service appropriate? Yes  Diet effective now              Precautions / Restrictions Precautions Precautions: Fall Precaution Comments: wound vac L foot, preexisting  R BKA Other Brace: cast shoe to protect L foot Restrictions Weight Bearing Restrictions: Yes LLE Weight Bearing: Non weight bearing Other Position/Activity Restrictions: R BK amp is healed   Has the patient had 2 or more falls or a fall with injury in the past year? No  Prior Activity Level Limited Community (1-2x/wk): wheelchair level, family using lift pta due to LLE issues recently  Prior Functional Level Self Care: Did the patient need help bathing, dressing, using the toilet or eating? Needed some help  Indoor Mobility: Did the patient need assistance with walking from room to room (with or without device)? Needed some help  Stairs: Did the patient need assistance with internal or external stairs (with or without device)? Needed some help  Functional Cognition: Did the patient need help planning regular tasks such as shopping or remembering to take medications? Needed some help  [place] Devices/Equipment: Eyeglasses, Dentures (specify type), Hospital bed, [employer], Environmental consultant (specify type), Bedside commode/3-in-1 Home Equipment: Walker - 2 wheels, Wheelchair - manual, Bedside commode, Hospital bed  Prior Device Use: Indicate devices/aids used by the patient prior to current illness, exacerbation or injury? Manual wheelchair  Current Functional Level Cognition  Overall Cognitive Status: Within Functional Limits for tasks assessed Orientation Level: Oriented X4    Extremity Assessment (includes Sensation/Coordination)  Upper Extremity Assessment: Overall WFL for tasks assessed  Lower Extremity Assessment: Generalized weakness, LLE deficits/detail, RLE deficits/detail RLE Deficits / Details: Increased challenge with SLR despite prior BKA LLE Deficits / Details: unable to fully assess due to NWB status, pt able to complete SLR to facilitate bed mobility LLE: Unable to fully assess due to pain    ADLs  Overall ADL's : Needs  assistance/impaired Eating/Feeding: Supervision/ safety, Sitting Grooming: Oral care, Sitting, Supervision/safety, Set up Upper Body Bathing: Moderate assistance Lower Body Bathing: Maximal assistance Upper Body Dressing : Maximal assistance Lower Body Dressing: Maximal assistance Toilet Transfer: +2 for safety/equipment, Total assistance Toilet Transfer Details (indicate cue type and reason): currently using condom cath and bed pan, assist with rolling for bed pan; total A +2  for simulated toilet tranfer to recliner with [place] and Hygiene: Maximal assistance, +2 for safety/equipment, Bed level Toileting - Clothing Manipulation Details (indicate cue type and reason): Pt assists with rolling for peri care General ADL Comments: session focus on seated grooming in chair; set-up/ supervision assist needed    Mobility  Overal bed mobility: Needs Assistance Bed Mobility: Rolling Rolling: Mod assist, +2 for physical assistance Supine to sit: Max assist, HOB elevated Sit to supine: Total assist, +2 for physical assistance General bed mobility comments: MOD A +2 for rolling to position lift pad; able to initiate but required MOD A to maintain position    Transfers  Overall transfer level: Needs assistance Equipment used: Ambulation equipment used Transfer via Lift Equipment: Maximove Transfers: Sit to/from Stand, Lateral/Scoot Transfers Sit to Stand: Total assist  Lateral/Scoot Transfers: Max assist General transfer comment: total A +2 for maxi move lift to chair    Ambulation / Gait / Stairs / Wheelchair Mobility  Ambulation/Gait General Gait Details: not ambulatory yet since amp of R LE    Posture / Balance Dynamic Sitting Balance Sitting balance - Comments: able to reach forward from recliner to gather ADL items Balance Overall balance assessment: Needs assistance Sitting-balance support: No upper extremity supported Sitting balance-Leahy Scale:  Poor Sitting balance - Comments: able to reach forward from recliner to gather ADL items Postural control: Right lateral lean(as he fatigued) Standing balance-Leahy Scale: Zero Standing balance comment: NT, in maximove    Special needs/care consideration BiPAP/CPAP  CPM  Continuous Drip IV  Dialysis         Days  Life Vest  Oxygen  Special Bed  Trach Size  Wound Vac (area) left LE surgical site Skin  removed on 11/25 Bowel mgmt: LBM 11/24 ; continent Bladder mgmt: l catheter Diabetic mgmt: yes Behavioral consideration  Chemo/radiation  Designated visitor is son , [name]. I verified on day of admit that he could not rotate with his sister on weekend for visitation   Previous Home Environment  Living Arrangements: (daughter and son providng 24/7 assist Daughter weekends)  Lives With: Alone(with family providng 24/7 assist) Available Help at Discharge: Family, Available 24 hours/day Type of Home: House Home Layout: One level Home Access: Ramped entrance Bathroom Shower/Tub: Multimedia programmer: Standard Bathroom Accessibility: Yes How Accessible: Accessible via walker [place]: Yes Type of [place] (if known): advance home care Additional Comments: hoyer lift  Discharge Living Setting Plans for Discharge Living Setting: Patient's home, Alone(with family providing 24/7 assist rotating) Type of Home at Discharge: House Discharge Home Layout: One level Discharge Home Access: [place] entrance Discharge Bathroom Shower/Tub: Walk-in shower Discharge Bathroom Toilet: Standard Discharge Bathroom Accessibility: Yes How Accessible: Accessible via walker Does the patient have any problems obtaining your medications?: No  Social/Family/Support Systems Patient Roles: Parent Contact Information: son and daughter Anticipated Caregiver: son and daughter Anticipated Caregiver's Contact Information: see  above Ability/Limitations of Caregiver: Daughter and her family recently with COVID 57, son not wiht COVID Caregiver Availability: 24/7 Discharge Plan Discussed with Primary Caregiver: Yes Is Caregiver In Agreement with Plan?: Yes Does Caregiver/Family have Issues with Lodging/Transportation while Pt is in Rehab?: No    Goals/Additional Needs Patient/Family Goal for Rehab: min asisst with PT and OT at wheelchair level Expected length of stay: ELOS 10 to 14 days Special Service Needs: [facility] Pt/Family Agrees to Admission and willing to participate: Yes Program Orientation Provided & Reviewed with  Pt/Caregiver Including Roles  & Responsibilities: Yes  Decrease burden of Care through IP rehab admission: n/a  Possible need for SNF placement upon discharge: not anticipated  Patient Condition: I have reviewed medical records from [facility] , spoken with CM, and patient and son. I met with patient at the bedside for inpatient rehabilitation assessment.  Patient will benefit from ongoing PT and OT, can actively participate in 3 hours of therapy a day 5 days of the week, and can make measurable gains during the admission.  Patient will also benefit from the coordinated team approach during an Inpatient Acute Rehabilitation admission.  The patient will receive intensive therapy as well as Rehabilitation physician, nursing, social worker, and care management interventions.  Due to bladder management, bowel management, safety, skin/wound care, disease management, medication administration, pain management and patient education the patient requires 24 hour a day rehabilitation nursing.  The patient is currently mod to max assist with mobility and basic ADLs.  Discharge setting and therapy post discharge at home with home health is anticipated.  Patient has agreed to participate in the Acute Inpatient Rehabilitation Program and will admit today.  Preadmission Screen Completed By: Danne Baxter, RN  MSN Cleatrice Burke, 12/09/2018 1:25 PM ______________________________________________________________________   Discussed status with Dr. Ranell Patrick on  12/09/2018 at  1324 and received approval for admission today.  Admission Coordinator:  Cleatrice Burke, RN, time  3818 Date  12/09/2018   Assessment/Plan: Diagnosis: Impaired mobility and ADLs secondary to left metatarsal amputation 1. Does the need for close, 24 hr/day Medical supervision in concert with the patient's rehab needs make it unreasonable for this patient to be served in a less intensive setting? Yes 2. Co-Morbidities requiring supervision/potential complications: right BKA, diastolic CHF stage 3, PAF, HTN, HLD 3. Due to bladder management, bowel management, safety, skin/wound care, disease management, medication administration, pain management and patient education, does the patient require 24 hr/day rehab nursing? Yes 4. Does the patient require coordinated care of a physician, rehab nurse, PT, OT, and SLP to address physical and functional deficits in the context of the above medical diagnosis(es)? Yes Addressing deficits in the following areas: balance, endurance, locomotion, strength, transferring, bowel/bladder control and bathing 5. Can the patient actively participate in an intensive therapy program of at least 3 hrs of therapy 5 days a week? Yes 6. The potential for patient to make measurable gains while on inpatient rehab is excellent 7. Anticipated functional outcomes upon discharge from inpatient rehab: modified independent PT, modified independent OT, independent SLP 8. Estimated rehab length of stay to reach the above functional goals is: 10-14 days 9. Anticipated discharge destination: Home 10. Overall Rehab/Functional Prognosis: excellent   MD Signature: Leeroy Cha, MD

## 2018-12-08 NOTE — Progress Notes (Signed)
Physical Therapy Treatment Patient Details Name: [name] MRN: [id number] DOB: 09/09/1927 Today's Date: 12/08/2018    History of Present Illness 85 year old past medical history significant for HTN, hyperlipidemia, diabetes type 2, chronic kidney disease a stage III, PE, A. fib, diastolic heart failure, right BKA admitted from home with with black discoloration of first toe on the left foot.  Now s/p L transmetatarsal amputation of L foot    PT Comments    Pt in bed upon PT/OT arrival, agreeable to PT session. Pt was able to demo good initiation and assist with bed mobility with both BUE and BLE to assist with rolling for peri-care and lift pad placement. Pt was then moved to the recliner with maximove where he completed BLE strengthening exercises and self-care tasks with OT while maintaining balance with minimal post support from recliner. The pt continues to demo limitations in mobility and independence, and will continue to benefit from skilled PT to address aforementioned limitations.     Follow Up Recommendations  CIR;Supervision/Assistance - 24 hour     Equipment Recommendations  None recommended by PT    Recommendations for Other Services       Precautions / Restrictions Precautions Precautions: Fall Precaution Comments: wound vac L foot, preexisting R BKA Required Braces or Orthoses: Other Brace Other Brace: cast shoe to protect L foot Restrictions Weight Bearing Restrictions: Yes LLE Weight Bearing: Non weight bearing Other Position/Activity Restrictions: R BK amp is healed    Mobility  Bed Mobility Overal bed mobility: Needs Assistance Bed Mobility: Rolling Rolling: Mod assist;+2 for physical assistance         General bed mobility comments: MOD A +2 for rolling to position lift pad; able to initiate but required MOD A to maintain position  Transfers Overall transfer level: Needs assistance Equipment used: Ambulation equipment used              General transfer comment: total A +2 for maxi move lift to chair  Ambulation/Gait             General Gait Details: not ambulatory yet since amp of R LE   Stairs             Wheelchair Mobility    Modified Rankin (Stroke Patients Only)       Balance Overall balance assessment: Needs assistance Sitting-balance support: No upper extremity supported Sitting balance-Leahy Scale: Poor Sitting balance - Comments: able to reach forward from recliner to gather ADL items     Standing balance-Leahy Scale: Zero Standing balance comment: NT, in maximove                            Cognition Arousal/Alertness: Awake/alert Behavior During Therapy: WFL for tasks assessed/performed Overall Cognitive Status: Within Functional Limits for tasks assessed                                        Exercises General Exercises - Lower Extremity Long Arc Quad: AROM;Both;10 reps;Seated Hip Flexion/Marching: AROM;Both;10 reps;Seated    General Comments        Pertinent Vitals/Pain Pain Assessment: Faces Pain Score: 4  Faces Pain Scale: Hurts little more Pain Location: L foot Pain Descriptors / Indicators: Throbbing Pain Intervention(s): Limited activity within patient's tolerance;Monitored during session;Repositioned    Home Living  Prior Function        Comments: patietn wheelchair level since 02/2018   PT Goals (current goals can now be found in the care plan section) Acute Rehab PT Goals Patient Stated Goal: Get to CIR and maximize independence Time For Goal Achievement: 12/11/18 Potential to Achieve Goals: Fair Progress towards PT goals: Progressing toward goals    Frequency    Min 3X/week      PT Plan Current plan remains appropriate    Co-evaluation PT/OT/SLP Co-Evaluation/Treatment: Yes Reason for Co-Treatment: Complexity of the patient's impairments (multi-system involvement) PT goals addressed  during session: Mobility/safety with mobility;Strengthening/ROM OT goals addressed during session: ADL's and self-care      AM-PAC PT "6 Clicks" Mobility   Outcome Measure  Help needed turning from your back to your side while in a flat bed without using bedrails?: A Lot Help needed moving from lying on your back to sitting on the side of a flat bed without using bedrails?: Total Help needed moving to and from a bed to a chair (including a wheelchair)?: Total Help needed standing up from a chair using your arms (e.g., wheelchair or bedside chair)?: Total Help needed to walk in hospital room?: Total Help needed climbing 3-5 steps with a railing? : Total 6 Click Score: 7    End of Session Equipment Utilized During Treatment: Other (comment)(maximove) Activity Tolerance: Patient tolerated treatment well Patient left: in chair;with call bell/phone within reach;with chair alarm set Nurse Communication: Mobility status PT Visit Diagnosis: Muscle weakness (generalized) (M62.81);Difficulty in walking, not elsewhere classified (R26.2);Adult, failure to thrive (R62.7);Pain Pain - Right/Left: Left Pain - part of body: Ankle and joints of foot     Time: 0927-0958 PT Time Calculation (min) (ACUTE ONLY): 31 min  Charges:  $Therapeutic Exercise: 8-22 mins                     Mickey Farber, PT, DPT   Acute Rehabilitation Department [phone]   Otho Bellows 12/08/2018, 12:04 PM

## 2018-12-08 NOTE — Progress Notes (Signed)
Occupational Therapy Treatment Patient Details Name: [name] MRN: [id number] DOB: 06-25-1927 Today's Date: 12/08/2018    History of present illness 85 year old past medical history significant for HTN, hyperlipidemia, diabetes type 2, chronic kidney disease a stage III, PE, A. fib, diastolic heart failure, right BKA admitted from home with with black discoloration of first toe on the left foot.  Now s/p L transmetatarsal amputation of L foot   OT comments  Pt making steady progress towards OT goals this session. Pt required total A +2 for transfer to recliner via maxi move. MOD A +2 to roll to position lift pad. Pt able to complete seated grooming from recliner with supervision/ set- up assist. DC plan remains appropriate, will continue to follow acutely per POC.    Follow Up Recommendations  CIR;Supervision/Assistance - 24 hour    Equipment Recommendations  Other (comment)(tbd to next venue)    Recommendations for Other Services      Precautions / Restrictions Precautions Precautions: Fall Precaution Comments: wound vac L foot, preexisting R BKA Required Braces or Orthoses: Other Brace Other Brace: cast shoe to protect L foot Restrictions Weight Bearing Restrictions: Yes LLE Weight Bearing: Non weight bearing Other Position/Activity Restrictions: R BK amp is healed       Mobility Bed Mobility Overal bed mobility: Needs Assistance Bed Mobility: Rolling Rolling: Mod assist;+2 for physical assistance         General bed mobility comments: MOD A +2 for rolling to position lift pad; able to initiate but required MOD A to maintain position  Transfers Overall transfer level: Needs assistance Equipment used: Ambulation equipment used             General transfer comment: total A +2 for maxi move lift to chair    Balance Overall balance assessment: Needs assistance Sitting-balance support: No upper extremity supported Sitting balance-Leahy Scale: Poor Sitting  balance - Comments: able to reach forward from recliner to gather ADL items     Standing balance-Leahy Scale: Zero                             ADL either performed or assessed with clinical judgement   ADL Overall ADL's : Needs assistance/impaired     Grooming: Oral care;Sitting;Supervision/safety;Set up                   Toilet Transfer: +2 for safety/equipment;Total assistance Toilet Transfer Details (indicate cue type and reason): currently using condom cath and bed pan, assist with rolling for bed pan; total A +2 for simulated toilet tranfer to recliner with MAXI MOVE           General ADL Comments: session focus on seated grooming in chair; set-up/ supervision assist needed     Vision Baseline Vision/History: Wears glasses Wears Glasses: At all times Patient Visual Report: No change from baseline     Perception     Praxis      Cognition Arousal/Alertness: Awake/alert Behavior During Therapy: WFL for tasks assessed/performed Overall Cognitive Status: Within Functional Limits for tasks assessed                                          Exercises     Shoulder Instructions       General Comments      Pertinent Vitals/ Pain  Pain Assessment: 0-10 Pain Score: 4  Pain Location: L foot Pain Descriptors / Indicators: Throbbing Pain Intervention(s): Monitored during session;Repositioned  Home Living                                          Prior Functioning/Environment              Frequency  Min 2X/week        Progress Toward Goals  OT Goals(current goals can now be found in the care plan section)  Progress towards OT goals: Progressing toward goals  Acute Rehab OT Goals Patient Stated Goal: Get to CIR and maximize independence OT Goal Formulation: With patient/family Time For Goal Achievement: 12/17/18 Potential to Achieve Goals: Good  Plan Discharge plan remains appropriate     Co-evaluation    PT/OT/SLP Co-Evaluation/Treatment: Yes Reason for Co-Treatment: For patient/therapist safety;To address functional/ADL transfers;Complexity of the patient's impairments (multi-system involvement)   OT goals addressed during session: ADL's and self-care      AM-PAC OT "6 Clicks" Daily Activity     Outcome Measure   Help from another person eating meals?: None Help from another person taking care of personal grooming?: A Little Help from another person toileting, which includes using toliet, bedpan, or urinal?: A Lot Help from another person bathing (including washing, rinsing, drying)?: A Lot Help from another person to put on and taking off regular upper body clothing?: A Lot Help from another person to put on and taking off regular lower body clothing?: Total 6 Click Score: 14    End of Session Equipment Utilized During Treatment: Other (comment)(maximove)  OT Visit Diagnosis: Other abnormalities of gait and mobility (R26.89);Muscle weakness (generalized) (M62.81);Pain Pain - Right/Left: Left Pain - part of body: Ankle and joints of foot   Activity Tolerance Patient tolerated treatment well   Patient Left in chair;with chair alarm set;with call bell/phone within reach   Nurse Communication Mobility status;Need for lift equipment        Time: [phone] OT Time Calculation (min): 29 min  Charges: OT General Charges $OT Visit: 1 Visit OT Treatments $Self Care/Home Management : 8-22 mins  [name] Clam., COTA/L Acute Rehabilitation Services [phone] [place] 12/08/2018, 10:11 AM

## 2018-12-08 NOTE — Discharge Instructions (Signed)

## 2018-12-09 ENCOUNTER — Other Ambulatory Visit: Payer: Self-pay

## 2018-12-09 ENCOUNTER — Encounter (HOSPITAL_COMMUNITY): Payer: Self-pay

## 2018-12-09 ENCOUNTER — Inpatient Hospital Stay (HOSPITAL_COMMUNITY)
Admission: RE | Admit: 2018-12-09 | Discharge: 2018-12-18 | DRG: 559 | Disposition: A | Discharge status: Other Acute Inpatient Hospital | Payer: Medicare Other | Source: Intra-hospital | Attending: Physical Medicine and Rehabilitation | Admitting: Physical Medicine and Rehabilitation

## 2018-12-09 ENCOUNTER — Inpatient Hospital Stay (HOSPITAL_COMMUNITY): Payer: Medicare Other

## 2018-12-09 DIAGNOSIS — Z7982 Long term (current) use of aspirin: Secondary | ICD-10-CM

## 2018-12-09 DIAGNOSIS — Z823 Family history of stroke: Secondary | ICD-10-CM

## 2018-12-09 DIAGNOSIS — Z87891 Personal history of nicotine dependence: Secondary | ICD-10-CM | POA: Diagnosis not present

## 2018-12-09 DIAGNOSIS — D509 Iron deficiency anemia, unspecified: Secondary | ICD-10-CM

## 2018-12-09 DIAGNOSIS — R197 Diarrhea, unspecified: Secondary | ICD-10-CM | POA: Diagnosis present

## 2018-12-09 DIAGNOSIS — R339 Retention of urine, unspecified: Secondary | ICD-10-CM | POA: Diagnosis present

## 2018-12-09 DIAGNOSIS — R338 Other retention of urine: Secondary | ICD-10-CM | POA: Diagnosis present

## 2018-12-09 DIAGNOSIS — E785 Hyperlipidemia, unspecified: Secondary | ICD-10-CM

## 2018-12-09 DIAGNOSIS — Z809 Family history of malignant neoplasm, unspecified: Secondary | ICD-10-CM | POA: Diagnosis not present

## 2018-12-09 DIAGNOSIS — Z9842 Cataract extraction status, left eye: Secondary | ICD-10-CM

## 2018-12-09 DIAGNOSIS — N1831 Chronic kidney disease, stage 3a: Secondary | ICD-10-CM

## 2018-12-09 DIAGNOSIS — E871 Hypo-osmolality and hyponatremia: Secondary | ICD-10-CM | POA: Diagnosis not present

## 2018-12-09 DIAGNOSIS — G4730 Sleep apnea, unspecified: Secondary | ICD-10-CM | POA: Diagnosis present

## 2018-12-09 DIAGNOSIS — N183 Chronic kidney disease, stage 3 unspecified: Secondary | ICD-10-CM | POA: Diagnosis not present

## 2018-12-09 DIAGNOSIS — Z09 Encounter for follow-up examination after completed treatment for conditions other than malignant neoplasm: Secondary | ICD-10-CM

## 2018-12-09 DIAGNOSIS — J189 Pneumonia, unspecified organism: Secondary | ICD-10-CM | POA: Diagnosis not present

## 2018-12-09 DIAGNOSIS — L97529 Non-pressure chronic ulcer of other part of left foot with unspecified severity: Secondary | ICD-10-CM | POA: Diagnosis present

## 2018-12-09 DIAGNOSIS — Z888 Allergy status to other drugs, medicaments and biological substances status: Secondary | ICD-10-CM

## 2018-12-09 DIAGNOSIS — I482 Chronic atrial fibrillation, unspecified: Secondary | ICD-10-CM | POA: Diagnosis not present

## 2018-12-09 DIAGNOSIS — Z8249 Family history of ischemic heart disease and other diseases of the circulatory system: Secondary | ICD-10-CM | POA: Diagnosis not present

## 2018-12-09 DIAGNOSIS — Z9841 Cataract extraction status, right eye: Secondary | ICD-10-CM | POA: Diagnosis not present

## 2018-12-09 DIAGNOSIS — Z89511 Acquired absence of right leg below knee: Secondary | ICD-10-CM

## 2018-12-09 DIAGNOSIS — M1990 Unspecified osteoarthritis, unspecified site: Secondary | ICD-10-CM | POA: Diagnosis present

## 2018-12-09 DIAGNOSIS — K5900 Constipation, unspecified: Secondary | ICD-10-CM | POA: Diagnosis not present

## 2018-12-09 DIAGNOSIS — I5032 Chronic diastolic (congestive) heart failure: Secondary | ICD-10-CM | POA: Diagnosis present

## 2018-12-09 DIAGNOSIS — I130 Hypertensive heart and chronic kidney disease with heart failure and stage 1 through stage 4 chronic kidney disease, or unspecified chronic kidney disease: Secondary | ICD-10-CM | POA: Diagnosis not present

## 2018-12-09 DIAGNOSIS — H409 Unspecified glaucoma: Secondary | ICD-10-CM | POA: Diagnosis not present

## 2018-12-09 DIAGNOSIS — I739 Peripheral vascular disease, unspecified: Secondary | ICD-10-CM | POA: Diagnosis present

## 2018-12-09 DIAGNOSIS — Z4781 Encounter for orthopedic aftercare following surgical amputation: Principal | ICD-10-CM

## 2018-12-09 DIAGNOSIS — Z79899 Other long term (current) drug therapy: Secondary | ICD-10-CM

## 2018-12-09 DIAGNOSIS — E1151 Type 2 diabetes mellitus with diabetic peripheral angiopathy without gangrene: Secondary | ICD-10-CM | POA: Diagnosis present

## 2018-12-09 DIAGNOSIS — D62 Acute posthemorrhagic anemia: Secondary | ICD-10-CM | POA: Diagnosis present

## 2018-12-09 DIAGNOSIS — Z7901 Long term (current) use of anticoagulants: Secondary | ICD-10-CM

## 2018-12-09 DIAGNOSIS — Z23 Encounter for immunization: Secondary | ICD-10-CM | POA: Diagnosis not present

## 2018-12-09 DIAGNOSIS — L97509 Non-pressure chronic ulcer of other part of unspecified foot with unspecified severity: Secondary | ICD-10-CM | POA: Diagnosis present

## 2018-12-09 DIAGNOSIS — N400 Enlarged prostate without lower urinary tract symptoms: Secondary | ICD-10-CM | POA: Diagnosis present

## 2018-12-09 DIAGNOSIS — E1159 Type 2 diabetes mellitus with other circulatory complications: Secondary | ICD-10-CM | POA: Diagnosis present

## 2018-12-09 DIAGNOSIS — E11621 Type 2 diabetes mellitus with foot ulcer: Secondary | ICD-10-CM | POA: Diagnosis present

## 2018-12-09 DIAGNOSIS — I480 Paroxysmal atrial fibrillation: Secondary | ICD-10-CM | POA: Diagnosis present

## 2018-12-09 DIAGNOSIS — E1152 Type 2 diabetes mellitus with diabetic peripheral angiopathy with gangrene: Secondary | ICD-10-CM | POA: Diagnosis not present

## 2018-12-09 DIAGNOSIS — Z87440 Personal history of urinary (tract) infections: Secondary | ICD-10-CM

## 2018-12-09 DIAGNOSIS — Z9102 Food additives allergy status: Secondary | ICD-10-CM

## 2018-12-09 DIAGNOSIS — E1122 Type 2 diabetes mellitus with diabetic chronic kidney disease: Secondary | ICD-10-CM | POA: Diagnosis present

## 2018-12-09 DIAGNOSIS — Z993 Dependence on wheelchair: Secondary | ICD-10-CM

## 2018-12-09 DIAGNOSIS — Z7984 Long term (current) use of oral hypoglycemic drugs: Secondary | ICD-10-CM

## 2018-12-09 DIAGNOSIS — Z91041 Radiographic dye allergy status: Secondary | ICD-10-CM

## 2018-12-09 DIAGNOSIS — E039 Hypothyroidism, unspecified: Secondary | ICD-10-CM | POA: Diagnosis present

## 2018-12-09 DIAGNOSIS — Z79890 Hormone replacement therapy (postmenopausal): Secondary | ICD-10-CM

## 2018-12-09 DIAGNOSIS — Z89432 Acquired absence of left foot: Secondary | ICD-10-CM | POA: Diagnosis present

## 2018-12-09 DIAGNOSIS — D638 Anemia in other chronic diseases classified elsewhere: Secondary | ICD-10-CM

## 2018-12-09 DIAGNOSIS — G546 Phantom limb syndrome with pain: Secondary | ICD-10-CM | POA: Diagnosis present

## 2018-12-09 DIAGNOSIS — N401 Enlarged prostate with lower urinary tract symptoms: Secondary | ICD-10-CM | POA: Diagnosis present

## 2018-12-09 DIAGNOSIS — E1142 Type 2 diabetes mellitus with diabetic polyneuropathy: Secondary | ICD-10-CM | POA: Diagnosis present

## 2018-12-09 LAB — BASIC METABOLIC PANEL
Anion gap: 9 (ref 5–15)
BUN: 23 mg/dL (ref 8–23)
CO2: 24 mmol/L (ref 22–32)
Calcium: 8.7 mg/dL — ABNORMAL LOW (ref 8.9–10.3)
Chloride: 97 mmol/L — ABNORMAL LOW (ref 98–111)
Creatinine, Ser: 1.37 mg/dL — ABNORMAL HIGH (ref 0.61–1.24)
GFR calc Af Amer: 53 mL/min — ABNORMAL LOW (ref >60)
GFR calc non Af Amer: 45 mL/min — ABNORMAL LOW (ref >60)
Glucose, Bld: 135 mg/dL — ABNORMAL HIGH (ref 70–99)
Potassium: 3.8 mmol/L (ref 3.5–5.1)
Sodium: 130 mmol/L — ABNORMAL LOW (ref 135–145)

## 2018-12-09 LAB — CBC
HCT: 27.7 % — ABNORMAL LOW (ref 39.0–52.0)
Hemoglobin: 9.3 g/dL — ABNORMAL LOW (ref 13.0–17.0)
MCH: 30.8 pg (ref 26.0–34.0)
MCHC: 33.6 g/dL (ref 30.0–36.0)
MCV: 91.7 fL (ref 80.0–100.0)
Platelets: 221 10*3/uL (ref 150–400)
RBC: 3.02 MIL/uL — ABNORMAL LOW (ref 4.22–5.81)
RDW: 15.9 % — ABNORMAL HIGH (ref 11.5–15.5)
WBC: 12.7 10*3/uL — ABNORMAL HIGH (ref 4.0–10.5)
nRBC: 0 % (ref 0.0–0.2)

## 2018-12-09 LAB — GLUCOSE, CAPILLARY
Glucose-Capillary: 149 mg/dL — ABNORMAL HIGH (ref 70–99)
Glucose-Capillary: 181 mg/dL — ABNORMAL HIGH (ref 70–99)
Glucose-Capillary: 147 mg/dL — ABNORMAL HIGH (ref 70–99)
Glucose-Capillary: 160 mg/dL — ABNORMAL HIGH (ref 70–99)

## 2018-12-09 LAB — BRAIN NATRIURETIC PEPTIDE: B Natriuretic Peptide: 249.1 pg/mL — ABNORMAL HIGH (ref 0.0–100.0)

## 2018-12-09 MED ORDER — FUROSEMIDE 20 MG PO TABS
20.0000 mg | ORAL_TABLET | Freq: Every day | ORAL | Status: DC
  Administered 2018-12-10 – 2018-12-18 (×9): 20 mg via ORAL
  Filled 2018-12-09 (×9): qty 1

## 2018-12-09 MED ORDER — FUROSEMIDE 20 MG PO TABS: 20.0000 mg | ORAL_TABLET | Freq: Every day | ORAL | Status: DC

## 2018-12-09 MED ORDER — FAMOTIDINE 20 MG PO TABS
20.0000 mg | ORAL_TABLET | Freq: Every day | ORAL | Status: DC
  Administered 2018-12-10 – 2018-12-17 (×8): 20 mg via ORAL
  Filled 2018-12-09 (×8): qty 1

## 2018-12-09 MED ORDER — DORZOLAMIDE HCL-TIMOLOL MAL 2-0.5 % OP SOLN
1.0000 [drp] | Freq: Two times a day (BID) | OPHTHALMIC | Status: DC
  Administered 2018-12-09 – 2018-12-18 (×18): 1 [drp] via OPHTHALMIC
  Filled 2018-12-09: qty 10

## 2018-12-09 MED ORDER — FINASTERIDE 5 MG PO TABS
5.0000 mg | ORAL_TABLET | Freq: Every evening | ORAL | Status: DC
  Administered 2018-12-09 – 2018-12-17 (×9): 5 mg via ORAL
  Filled 2018-12-09 (×9): qty 1

## 2018-12-09 MED ORDER — TRAMADOL HCL 50 MG PO TABS: 50.0000 mg | ORAL_TABLET | Freq: Four times a day (QID) | ORAL | Status: DC | PRN

## 2018-12-09 MED ORDER — FUROSEMIDE 40 MG PO TABS: 40.0000 mg | ORAL_TABLET | Freq: Every day | ORAL | 1 refills | Status: DC

## 2018-12-09 MED ORDER — APIXABAN 5 MG PO TABS: 5.0000 mg | ORAL_TABLET | Freq: Two times a day (BID) | ORAL | Status: DC

## 2018-12-09 MED ORDER — AZITHROMYCIN 500 MG PO TABS
500.0000 mg | ORAL_TABLET | ORAL | Status: AC
  Administered 2018-12-10 – 2018-12-13 (×4): 500 mg via ORAL
  Filled 2018-12-09 (×5): qty 1

## 2018-12-09 MED ORDER — SENNOSIDES-DOCUSATE SODIUM 8.6-50 MG PO TABS: 1.0000 | ORAL_TABLET | Freq: Two times a day (BID) | ORAL | Status: DC | PRN

## 2018-12-09 MED ORDER — ONDANSETRON HCL 4 MG/2ML IJ SOLN: 4.0000 mg | Freq: Four times a day (QID) | INTRAMUSCULAR | Status: DC | PRN

## 2018-12-09 MED ORDER — AZITHROMYCIN 250 MG PO TABS: 250.0000 mg | ORAL_TABLET | Freq: Every day | ORAL | 0 refills | Status: DC

## 2018-12-09 MED ORDER — SODIUM CHLORIDE 0.9 % IV SOLN
1.0000 g | INTRAVENOUS | Status: AC
  Administered 2018-12-10 – 2018-12-13 (×4): 1 g via INTRAVENOUS
  Filled 2018-12-09 (×2): qty 1
  Filled 2018-12-09 (×2): qty 10
  Filled 2018-12-09: qty 1

## 2018-12-09 MED ORDER — TRAMADOL HCL 50 MG PO TABS
50.0000 mg | ORAL_TABLET | Freq: Four times a day (QID) | ORAL | Status: DC | PRN
  Administered 2018-12-09 – 2018-12-15 (×8): 50 mg via ORAL
  Filled 2018-12-09 (×9): qty 1

## 2018-12-09 MED ORDER — ONDANSETRON HCL 4 MG PO TABS: 4.0000 mg | ORAL_TABLET | Freq: Four times a day (QID) | ORAL | Status: DC | PRN

## 2018-12-09 MED ORDER — ACETAMINOPHEN 500 MG PO TABS: 1000.0000 mg | ORAL_TABLET | Freq: Three times a day (TID) | ORAL | Status: DC

## 2018-12-09 MED ORDER — ALUM & MAG HYDROXIDE-SIMETH 200-200-20 MG/5ML PO SUSP: 15.0000 mL | ORAL | Status: DC | PRN

## 2018-12-09 MED ORDER — VITAMIN B-12 1000 MCG PO TABS: 1000.0000 ug | ORAL_TABLET | Freq: Every day | ORAL | Status: DC

## 2018-12-09 MED ORDER — ATORVASTATIN CALCIUM 40 MG PO TABS
40.0000 mg | ORAL_TABLET | Freq: Every day | ORAL | Status: DC
  Administered 2018-12-10 – 2018-12-17 (×8): 40 mg via ORAL
  Filled 2018-12-09 (×8): qty 1

## 2018-12-09 MED ORDER — CLOPIDOGREL BISULFATE 75 MG PO TABS
75.0000 mg | ORAL_TABLET | Freq: Every day | ORAL | Status: DC
  Administered 2018-12-10 – 2018-12-16 (×7): 75 mg via ORAL
  Filled 2018-12-09 (×8): qty 1

## 2018-12-09 MED ORDER — INFLUENZA VAC A&B SA ADJ QUAD 0.5 ML IM PRSY
0.5000 mL | PREFILLED_SYRINGE | INTRAMUSCULAR | Status: AC
  Administered 2018-12-11: 0.5 mL via INTRAMUSCULAR
  Filled 2018-12-09: qty 0.5

## 2018-12-09 MED ORDER — POLYSACCHARIDE IRON COMPLEX 150 MG PO CAPS
150.0000 mg | ORAL_CAPSULE | Freq: Every day | ORAL | Status: DC
  Administered 2018-12-10 – 2018-12-17 (×8): 150 mg via ORAL
  Filled 2018-12-09 (×8): qty 1

## 2018-12-09 MED ORDER — TRAZODONE HCL 50 MG PO TABS
25.0000 mg | ORAL_TABLET | Freq: Every evening | ORAL | Status: DC | PRN
  Filled 2018-12-09: qty 1

## 2018-12-09 MED ORDER — POLYETHYLENE GLYCOL 3350 17 G PO PACK: 17.0000 g | PACK | Freq: Every day | ORAL | 0 refills | Status: DC | PRN

## 2018-12-09 MED ORDER — AMOXICILLIN-POT CLAVULANATE 875-125 MG PO TABS: 1.0000 | ORAL_TABLET | Freq: Two times a day (BID) | ORAL | 0 refills | Status: AC

## 2018-12-09 MED ORDER — AMLODIPINE BESYLATE 10 MG PO TABS
10.0000 mg | ORAL_TABLET | Freq: Every day | ORAL | Status: DC
  Administered 2018-12-10 – 2018-12-18 (×9): 10 mg via ORAL
  Filled 2018-12-09 (×9): qty 1

## 2018-12-09 MED ORDER — TAMSULOSIN HCL 0.4 MG PO CAPS
0.4000 mg | ORAL_CAPSULE | Freq: Two times a day (BID) | ORAL | Status: DC
  Administered 2018-12-09 – 2018-12-18 (×18): 0.4 mg via ORAL
  Filled 2018-12-09 (×18): qty 1

## 2018-12-09 MED ORDER — OXYCODONE HCL 5 MG PO TABS
5.0000 mg | ORAL_TABLET | ORAL | Status: DC | PRN
  Administered 2018-12-10 – 2018-12-11 (×3): 5 mg via ORAL
  Filled 2018-12-09 (×5): qty 1

## 2018-12-09 MED ORDER — OXYCODONE HCL 5 MG PO TABS
5.0000 mg | ORAL_TABLET | ORAL | Status: DC | PRN
  Administered 2018-12-09: 5 mg via ORAL
  Filled 2018-12-09: qty 1

## 2018-12-09 MED ORDER — LEVOTHYROXINE SODIUM 50 MCG PO TABS
50.0000 ug | ORAL_TABLET | Freq: Every day | ORAL | Status: DC
  Administered 2018-12-10 – 2018-12-18 (×9): 50 ug via ORAL
  Filled 2018-12-09: qty 1
  Filled 2018-12-09 (×4): qty 2
  Filled 2018-12-09 (×3): qty 1
  Filled 2018-12-09 (×2): qty 2
  Filled 2018-12-09 (×2): qty 1
  Filled 2018-12-09: qty 2
  Filled 2018-12-09: qty 1
  Filled 2018-12-09: qty 2
  Filled 2018-12-09: qty 1
  Filled 2018-12-09: qty 2
  Filled 2018-12-09: qty 1

## 2018-12-09 MED ORDER — APIXABAN 5 MG PO TABS
5.0000 mg | ORAL_TABLET | Freq: Two times a day (BID) | ORAL | Status: DC
  Administered 2018-12-09 – 2018-12-16 (×14): 5 mg via ORAL
  Filled 2018-12-09 (×14): qty 1

## 2018-12-09 MED ORDER — ACETAMINOPHEN 325 MG PO TABS
650.0000 mg | ORAL_TABLET | ORAL | Status: DC | PRN
  Administered 2018-12-11 – 2018-12-16 (×9): 650 mg via ORAL
  Filled 2018-12-09 (×9): qty 2

## 2018-12-09 MED ORDER — OXYCODONE HCL 5 MG PO TABS: 5.0000 mg | ORAL_TABLET | Freq: Four times a day (QID) | ORAL | 0 refills | Status: AC | PRN

## 2018-12-09 MED ORDER — POLYSACCHARIDE IRON COMPLEX 150 MG PO CAPS: 150.0000 mg | ORAL_CAPSULE | Freq: Every day | ORAL | Status: DC

## 2018-12-09 MED ORDER — POTASSIUM CHLORIDE CRYS ER 10 MEQ PO TBCR
10.0000 meq | EXTENDED_RELEASE_TABLET | Freq: Every day | ORAL | Status: DC
  Administered 2018-12-10 – 2018-12-18 (×9): 10 meq via ORAL
  Filled 2018-12-09 (×9): qty 1

## 2018-12-09 MED ORDER — VITAMIN B-12 100 MCG PO TABS
100.0000 ug | ORAL_TABLET | Freq: Every day | ORAL | Status: DC
  Administered 2018-12-10 – 2018-12-17 (×8): 100 ug via ORAL
  Filled 2018-12-09 (×9): qty 1

## 2018-12-09 MED ORDER — INSULIN ASPART 100 UNIT/ML ~~LOC~~ SOLN
0.0000 [IU] | Freq: Three times a day (TID) | SUBCUTANEOUS | Status: DC
  Administered 2018-12-09 – 2018-12-10 (×2): 2 [IU] via SUBCUTANEOUS
  Administered 2018-12-10 – 2018-12-11 (×3): 3 [IU] via SUBCUTANEOUS
  Administered 2018-12-11 (×2): 2 [IU] via SUBCUTANEOUS
  Administered 2018-12-12 (×2): 3 [IU] via SUBCUTANEOUS
  Administered 2018-12-12 – 2018-12-13 (×2): 2 [IU] via SUBCUTANEOUS
  Administered 2018-12-13 – 2018-12-14 (×3): 3 [IU] via SUBCUTANEOUS
  Administered 2018-12-14 – 2018-12-15 (×3): 2 [IU] via SUBCUTANEOUS
  Administered 2018-12-15: 3 [IU] via SUBCUTANEOUS
  Administered 2018-12-15 – 2018-12-16 (×2): 2 [IU] via SUBCUTANEOUS
  Administered 2018-12-16: 3 [IU] via SUBCUTANEOUS
  Administered 2018-12-17: 2 [IU] via SUBCUTANEOUS
  Administered 2018-12-17: 3 [IU] via SUBCUTANEOUS

## 2018-12-09 MED ORDER — CLOPIDOGREL BISULFATE 75 MG PO TABS: 75.0000 mg | ORAL_TABLET | Freq: Every day | ORAL | Status: DC

## 2018-12-09 NOTE — Progress Notes (Addendum)
Patient arrived to unit and oriented to unit routine. Visitor educated about visitor policies and mask wearing. No questions or complaints at this time.   Dressing changed this morning then re-wrapped per patient and report. Requested not to re-do dressing on admission because pain finally under control.

## 2018-12-09 NOTE — Discharge Summary (Signed)
Physician Discharge Summary  [name] KDT:[id number] DOB: 07-29-1927 DOA: 11/18/2018  PCP: Redmond School, MD  Admit date: 11/18/2018 Discharge date: 12/09/2018  Admitted From: Home Disposition: CIR  Recommendations for Outpatient Follow-up:  1. Follow up with PCP, orthopedic surgery and vascular surgery in 1- 2 weeks 2. Please obtain CBC/BMP/Mag in 1 week. 3. Please follow up on the following pending results: None  Home Health: Not applicable Equipment/Devices: Not applicable  Discharge Condition: Stable CODE STATUS: DNR/DNI  Follow-up Information    [name], MD In 1 week.   Specialty: Orthopedic Surgery Contact information: [place] [address] [phone]           Hospital Course: 85 year old male with history of HTN, HLD, DM-2, CKD-3, PE, A. fib, diastolic CHF, PAD s/p right BKA and impaired hearing presenting with black discoloration of left first toe concerning for dry gangrene.  Foot x-ray concerning for osteomyelitis.  He was started on IV vancomycin and Zosyn.  Underwent revascularization with arthrectomy of distal left SFA and popliteal artery on 11/23/2018 and revascularization of the left TP trunk into the peroneal artery using 2 overlapping drug-eluting stents as well as drug-coated balloon angioplasty of the distal left popliteal artery by vascular surgery on 11/24/2008 by Dr. [name].  Vascular surgery recommended DAPT.  He also underwent transmetatarsal amputation of the left foot on 12/02/2018 by orthopedic surgery, Dr [name].  Antibiotic discontinued by orthopedic surgery after transmetatarsal amputation.  He was placed on wound VAC after surgery which was removed on the day of discharge by orthopedic surgery.  Recommended daily dressing and outpatient follow-up with orthopedic surgery 1 week.  Patient is weightbearing as tolerated per orthopedic surgery.  See details of vascular intervention below.  Patient had  intermittent cough with mild leukocytosis/bandemia.  Chest x-ray on 12/08/2018 with worsening bilateral opacity.  Started on ceftriaxone and azithromycin for possible pneumonia.  He was transitioned to Augmentin and azithromycin to complete 5 days of antibiotic course.  Patient was evaluated by PT/OT who recommended CIR.  Subjective: No major events overnight of this morning.  No complaints this morning but significant pain after wound VAC removal by orthopedic surgery.  See individual problem list below for more hospital course.  Discharge Diagnoses:  First left toe with dry gangrene/ischemic ulcer and cellulitis in the setting of peripheral vascular disease with critical limb ischemia of the left great toe: -Status post successful complex revascularization and transmetatarsal amputation as above. -Wound VAC removed 12/09/2018. -Aspirin, Plavix and statin. -Daily wound dressing -[facility] at [facility] -Outpatient follow-up with vascular surgery and orthopedic surgery in 1 to 2 weeks.  Contact With COVID postive person on 11/15:  -Patient is asymptomatic. COVID 19 repeated 11-20 negative -Recommend  use face mask and face shield for patient contact at least through 11/26.   Abnormal chest x-ray: review of CXR not convincing for pneumonia but bilateral opacities which could be atelectasis.  He has borderline leukocytosis but no fever. Patient's Lasix was on hold.  -Complete 5 days of antibiotic course with Augmentin and azithromycin -Lasix as below. -May consider retesting for COVID-19 if fever or significant respiratory symptoms  Chronic diastolic CHF: Echo in 82/5053 with normal EF but G2DD.  Appears euvolemic.  No cardiopulmonary symptoms.   -Discharged on Lasix 40 mg daily.  May adjust dose as appropriate -Daily weight, intake output -Check renal function and magnesium in 1 week.  Essential hypertension: -Discharged on home amlodipine  Hyperlipidemia: Continue Lipitor.  Hypothyroidism: Continue with Synthroid  Leukocytosis; follow trend, afebrile.   AKI on CKD-3a: baseline appears to be 1.1-1.3.  Stable now. -Recheck BMP at follow-up   Mixed iron deficiency anemia and anemia of chronic disease: H&H stable. Vitamin B12 deficiency -P.o. ferrous sulfate and vitamin B12 -Recheck CBC at follow-up  Diabetes type 2 with foot ulcer:  -Resume home glipizide -Continue statin   BPH:  Stable. -Continue Flomax and finasteride. -Discontinued Cardura  Pressure injury left heel stage III: POA -Daily wound care   Discharge Instructions  Discharge Instructions    (HEART FAILURE PATIENTS) Call MD:  Anytime you have any of the following symptoms: 1) 3 pound weight gain in 24 hours or 5 pounds in 1 week 2) shortness of breath, with or without a dry hacking cough 3) swelling in the hands, feet or stomach 4) if you have to sleep on extra pillows at night in order to breathe.   Complete by: As directed    Call MD for:  difficulty breathing, headache or visual disturbances   Complete by: As directed    Call MD for:  persistant dizziness or light-headedness   Complete by: As directed    Call MD for:  redness, tenderness, or signs of infection (pain, swelling, redness, odor or green/yellow discharge around incision site)   Complete by: As directed    Call MD for:  temperature >100.4   Complete by: As directed    Diet - low sodium heart healthy   Complete by: As directed    Diet Carb Modified   Complete by: As directed    Neg Press Wound Therapy / Incisional   Complete by: As directed    Show patient or family how to attach prevena vac. Will be removed in office     Allergies as of 12/09/2018      Reactions   Iodinated Diagnostic Agents    Other reaction(s): Fever   Dye Fdc Red [red Dye] Hives   Iodine-131 Other (See Comments)   Breakout, fever   Iohexol     Code: RASH, Desc: PT STATES HOT ALL OVER HAD TO HAVE ICE PACKS ALL OVER HIM AND DIFF  BREATHING, PT REFUSED IV DYE      Medication List    STOP taking these medications   doxazosin 2 MG tablet Commonly known as: Cardura   vitamin E 400 UNIT capsule     TAKE these medications   acetaminophen 325 MG tablet Commonly known as: TYLENOL Take 1-2 tablets (325-650 mg total) by mouth every 6 (six) hours as needed for mild pain (pain score 1-3 or temp > 100.5). What changed: Another medication with the same name was added. Make sure you understand how and when to take each.   acetaminophen 500 MG tablet Commonly known as: TYLENOL Take 2 tablets (1,000 mg total) by mouth every 8 (eight) hours. What changed: You were already taking a medication with the same name, and this prescription was added. Make sure you understand how and when to take each.   amoxicillin-clavulanate 875-125 MG tablet Commonly known as: Augmentin Take 1 tablet by mouth 2 (two) times daily for 5 days.   apixaban 5 MG Tabs tablet Commonly known as: ELIQUIS Take 1 tablet (5 mg total) by mouth 2 (two) times daily. What changed:   medication strength  how much to take   aspirin EC 81 MG tablet Take 81 mg by mouth daily.   atorvastatin 40 MG tablet Commonly known as: LIPITOR Take 1  tablet (40 mg total) by mouth daily.   azithromycin 250 MG tablet Commonly known as: Zithromax Take 1 tablet (250 mg total) by mouth daily. Start taking on: December 10, 2018   clopidogrel 75 MG tablet Commonly known as: PLAVIX Take 1 tablet (75 mg total) by mouth daily with breakfast. Start taking on: December 10, 2018   dorzolamide-timolol 22.3-6.8 MG/ML ophthalmic solution Commonly known as: COSOPT Place 1 drop into both eyes 2 (two) times daily.   famotidine 20 MG tablet Commonly known as: PEPCID Take 1 tablet (20 mg total) by mouth daily.   finasteride 5 MG tablet Commonly known as: PROSCAR Take 1 tablet (5 mg total) by mouth every evening.   FISH OIL PO Take 1 capsule by mouth daily.    furosemide 40 MG tablet Commonly known as: LASIX Take 1 tablet (40 mg total) by mouth daily. What changed: when to take this   glipiZIDE 5 MG tablet Commonly known as: GLUCOTROL Take 5 mg by mouth 2 (two) times daily.   iron polysaccharides 150 MG capsule Commonly known as: NIFEREX Take 1 capsule (150 mg total) by mouth daily. Start taking on: December 10, 2018   levothyroxine 50 MCG tablet Commonly known as: SYNTHROID Take 1 tablet (50 mcg total) by mouth daily before breakfast.   oxyCODONE 5 MG immediate release tablet Commonly known as: Oxy IR/ROXICODONE Take 1 tablet (5 mg total) by mouth every 6 (six) hours as needed for up to 5 days for severe pain or breakthrough pain.   polyethylene glycol 17 g packet Commonly known as: MiraLax Take 17 g by mouth daily as needed for moderate constipation.   potassium chloride 10 MEQ tablet Commonly known as: KLOR-CON Take 1 tablet (10 mEq total) by mouth daily.   povidone-Iodine 5 % Soln topical solution Commonly known as: BETADINE Apply 1 application topically daily as needed for wound care.   Santyl ointment Generic drug: collagenase Apply 1 application topically daily.   senna-docusate 8.6-50 MG tablet Commonly known as: Senokot-S Take 1 tablet by mouth 2 (two) times daily as needed for mild constipation.   tamsulosin 0.4 MG Caps capsule Commonly known as: FLOMAX Take 1 capsule (0.4 mg total) by mouth 2 (two) times daily.   traMADol 50 MG tablet Commonly known as: ULTRAM Take 1 tablet (50 mg total) by mouth every 6 (six) hours as needed for moderate pain.   vitamin B-12 1000 MCG tablet Commonly known as: CYANOCOBALAMIN Take 1 tablet (1,000 mcg total) by mouth daily.   vitamin C 500 MG tablet Commonly known as: ASCORBIC ACID Take 500 mg by mouth daily.       Consultations:  Orthopedic surgery  Vascular surgery  Procedures/Studies:  11/23/2018-Procedures Performed:               1.  Ultrasound-guided  right common femoral access/contralateral access               2.  Placement of an NAV6 distal protection device below the knee left popliteal artery               3.  Diamondback orbital rotational atherectomy distal left SFA and popliteal artery               4.  DCB distal left SFA/popliteal artery  11/25/2018 PERIPHERAL VASCULAR BALLOON ANGIOPLASTY  LOWER EXTREMITY ANGIOGRAPHY  PERIPHERAL VASCULAR INTERVENTION  Successful complex revascularization of the left TP trunk into the peroneal artery using 2 overlapping drug-eluting stents as well as  drug coated balloon angioplasty of the distal left popliteal artery.  The occlusion was crossed retrograde via the peroneal artery.    Dg Chest 2 View  Result Date: 11/18/2018 CLINICAL DATA:  85 y.o male. Hx CHF, HTN. Recent admission for same. EXAM: CHEST - 2 VIEW COMPARISON:  Chest radiograph 11/13/2018 FINDINGS: Stable cardiomediastinal contours with enlarged heart size. Central vascular congestion. Hazy bibasilar pulmonary opacities likely reflecting combination of atelectasis and pleural fluid, superimposed infection not excluded. Moderate bilateral pleural effusions. No pneumothorax. No acute finding in the visualized skeleton. IMPRESSION: 1. Cardiomegaly with central vascular congestion. 2. Hazy bibasilar pulmonary opacities likely reflecting a combination of atelectasis and pleural fluid, superimposed infection not excluded. Moderate bilateral pleural effusions. Electronically Signed   By: Audie Pinto M.D.   On: 11/18/2018 19:44   Dg Chest Port 1 View  Result Date: 12/08/2018 CLINICAL DATA:  Leukocytosis. EXAM: PORTABLE CHEST 1 VIEW COMPARISON:  11/18/2018. FINDINGS: Stable cardiomegaly. Bibasilar pulmonary infiltrates/edema, progressed from prior exam. Bilateral moderate pleural effusions again noted. No pneumothorax. IMPRESSION: Stable cardiomegaly. Bibasilar pulmonary infiltrates/edema, progressed from prior exam. Progressive CHF and/or  pneumonia could present this fashion. Bilateral moderate pleural effusions again noted without interim change. Electronically Signed   By: Marcello Moores  Register   On: 12/08/2018 09:46   Dg Chest Port 1 View  Result Date: 11/13/2018 CLINICAL DATA:  Pleural effusion EXAM: PORTABLE CHEST 1 VIEW COMPARISON:  Under 4 days ago FINDINGS: Normal heart size. Congested appearance of the hila. Hazy appearance of the lower chest from layering pleural effusions, likely improved. There is also vascular congestion. IMPRESSION: Layering pleural effusions and vascular congestion. Aeration has likely improved from 4 days ago. Electronically Signed   By: Monte Fantasia M.D.   On: 11/13/2018 05:02   Dg Foot 2 Views Left  Result Date: 11/18/2018 CLINICAL DATA:  85 y.o male. Black color to greater toe, redness and swelling. EXAM: LEFT FOOT - 2 VIEW COMPARISON:  None. FINDINGS: There is no evidence of fracture or dislocation. No evidence of bony destruction. Moderate scattered degenerative changes. Extensive vascular calcification. IMPRESSION: No acute osseous abnormality in the left foot. Electronically Signed   By: Audie Pinto M.D.   On: 11/18/2018 19:42      Discharge Exam: Vitals:   12/09/18 0602 12/09/18 1110  BP: 136/62 137/60  Pulse: 64   Resp:    Temp: 98.5 F (36.9 C)   SpO2: 96%     GENERAL: No acute distress.  Appears well.  HEENT: MMM.  Vision grossly intact.  Hard of hearing. NECK: Supple.  No apparent JVD.  RESP:  No IWOB. Good air movement bilaterally. CVS:  RRR. Heart sounds normal.  ABD/GI/GU: Bowel sounds present. Soft. Non tender.  MSK/EXT:  Moves extremities. No apparent deformity. Wound VAC over left foot. SKIN: no apparent skin lesion or wound NEURO: Awake, alert and oriented x4 except time.  No gross deficit.  PSYCH: Calm. Normal affect.  The results of significant diagnostics from this hospitalization (including imaging, microbiology, ancillary and laboratory) are listed below  for reference.     Microbiology: Recent Results (from the past 240 hour(s))  SARS CORONAVIRUS 2 (TAT 6-24 HRS) Nasopharyngeal Nasopharyngeal Swab     Status: None   Collection Time: 12/04/18  3:15 PM   Specimen: Nasopharyngeal Swab  Result Value Ref Range Status   SARS Coronavirus 2 NEGATIVE NEGATIVE Final    Comment: (NOTE) SARS-CoV-2 target nucleic acids are NOT DETECTED. The SARS-CoV-2 RNA is generally detectable in upper and  lower respiratory specimens during the acute phase of infection. Negative results do not preclude SARS-CoV-2 infection, do not rule out co-infections with other pathogens, and should not be used as the sole basis for treatment or other patient management decisions. Negative results must be combined with clinical observations, patient history, and epidemiological information. The expected result is Negative. Fact Sheet for Patients: SugarRoll.be Fact Sheet for Healthcare Providers: https://www.woods-mathews.com/ This test is not yet approved or cleared by the [place] FDA and  has been authorized for detection and/or diagnosis of SARS-CoV-2 by FDA under an Emergency Use Authorization (EUA). This EUA will remain  in effect (meaning this test can be used) for the duration of the COVID-19 declaration under Section 56 4(b)(1) of the Act, 21 U.S.C. section 360bbb-3(b)(1), unless the authorization is terminated or revoked sooner. Performed at [facility], [place] [address] [place],  [address]      Labs: BNP (last 3 results) Recent Labs    11/09/18 1209 11/18/18 2132 12/09/18 0343  BNP 258.0* 213.1* 580.9*   Basic Metabolic Panel: Recent Labs  Lab 12/03/18 0822 12/04/18 0341 12/05/18 0819 12/07/18 0743 12/09/18 0343  NA 133* 134* 132* 130* 130*  K 3.8 4.2 4.1 3.8 3.8  CL 99 100 99 95* 97*  CO2 24 24 23 25 24   GLUCOSE 143* 130* 145* 144* 135*  BUN 17 18 17 19 23   CREATININE 1.23 1.29*  1.20 1.22 1.37*  CALCIUM 8.6* 9.0 8.8* 8.8* 8.7*   Liver Function Tests: No results for input(s): AST, ALT, ALKPHOS, BILITOT, PROT, ALBUMIN in the last 168 hours. No results for input(s): LIPASE, AMYLASE in the last 168 hours. No results for input(s): AMMONIA in the last 168 hours. CBC: Recent Labs  Lab 12/04/18 0341 12/05/18 0819 12/07/18 0743 12/08/18 0353 12/09/18 0343  WBC 8.8 9.9 13.0* 13.1* 12.7*  HGB 9.9* 10.1* 9.7* 9.2* 9.3*  HCT 30.1* 29.9* 29.1* 27.4* 27.7*  MCV 92.9 91.7 92.1 90.4 91.7  PLT 278 279 270 246 221   Cardiac Enzymes: No results for input(s): CKTOTAL, CKMB, CKMBINDEX, TROPONINI in the last 168 hours. BNP: Invalid input(s): POCBNP CBG: Recent Labs  Lab 12/08/18 1554 12/08/18 2144 12/08/18 2340 12/09/18 0747 12/09/18 1124  GLUCAP 218* 187* 153* 149* 181*   D-Dimer No results for input(s): DDIMER in the last 72 hours. Hgb A1c No results for input(s): HGBA1C in the last 72 hours. Lipid Profile No results for input(s): CHOL, HDL, LDLCALC, TRIG, CHOLHDL, LDLDIRECT in the last 72 hours. Thyroid function studies No results for input(s): TSH, T4TOTAL, T3FREE, THYROIDAB in the last 72 hours.  Invalid input(s): FREET3 Anemia work up No results for input(s): VITAMINB12, FOLATE, FERRITIN, TIBC, IRON, RETICCTPCT in the last 72 hours. Urinalysis    Component Value Date/Time   COLORURINE YELLOW 12/08/2018 1500   APPEARANCEUR HAZY (A) 12/08/2018 1500   LABSPEC 1.010 12/08/2018 1500   PHURINE 6.0 12/08/2018 1500   GLUCOSEU NEGATIVE 12/08/2018 1500   HGBUR NEGATIVE 12/08/2018 1500   BILIRUBINUR NEGATIVE 12/08/2018 1500   KETONESUR NEGATIVE 12/08/2018 1500   PROTEINUR NEGATIVE 12/08/2018 1500   NITRITE NEGATIVE 12/08/2018 1500   LEUKOCYTESUR MODERATE (A) 12/08/2018 1500   Sepsis Labs Invalid input(s): PROCALCITONIN,  WBC,  LACTICIDVEN   Time coordinating discharge: 45 minutes  SIGNED:  Mercy Riding, MD  [facility] 12/09/2018, 2:20 PM   If 7PM-7AM, please contact night-coverage [email or web address] Password [email or web address]

## 2018-12-09 NOTE — H&P (Signed)
Physical Medicine and Rehabilitation Admission H&P    Chief Complaint  Patient presents with  . Functional decline due to left transmetatarsal amputation.   . Recent R-BKA  : HPI: [name] is a 85 year old right-handed male well-known to rehab services with CIR admission 09/07/2018 until 09/22/2018 for right BKA secondary to peripheral vascular disease--was discharged home requiring minimal assist for mobility as well as history of diabetes mellitus, recent admission for diastolic congestive heart failure, CKD stage III, PAF --on Eliquis (Dr. Debara Pickett), hypertension, hyperlipidemia.  Per chart review patient lives with family with assistance as needed 1 level home with ramped entrance.  Family was using a lift to help aid in transfers at home.  Presented 11/18/2018 with black discoloration of first toe left foot.  Patient had been followed at the wound care center.  Noted progressive redness of the dorsum of the left foot as well as skin ulcer 2 to 3 mm on the lateral aspect of the left foot.  X-rays revealed no acute osseous abnormality in the left foot.  Admission chemistries with sodium 132, BUN 31, creatinine 1.27, hemoglobin 11.5, WBC 10,800, lactic acid within normal limits 1.1, sedimentation rate elevated at 84 and BC negative.  He was started on IV vancomycin and Zosyn.    Dr. Sharol Given consulted for input and felt that foot not salvageable.  He received cardiac clearance and underwent left transmetatarsal amputation with placement of wound VAC on 12/02/2018 per Dr. Sharol Given. Postoperative Eliquis and Plavix resumed and low-dose aspirin was discontinued after discussing with cardiology services.  Acute blood loss anemia being monitored. He did develop leucocytosis with cough and CXR 11/24 showed progressive CHF/PNA. He was treated with IV diuresis 11/24 and was started on broad spectrum antibiotics due to concerns of PNA. Diarrhea improving off laxatives. Of note, patient did have contact with sister  11/15 and she tested COVID positive on 11/18--> he was tested on 11/20 and is COVID negative.    To be NWB LLE. Therapy ongoing and patient cleared for comprehensive rehab program.   Review of Systems  Constitutional: Negative for chills and fever.  HENT: Negative for hearing loss.   Eyes: Negative for blurred vision and double vision.  Respiratory: Negative for cough and shortness of breath.   Cardiovascular: Positive for palpitations and leg swelling. Negative for chest pain.  Gastrointestinal: Positive for constipation. Negative for heartburn, nausea and vomiting.  Genitourinary: Positive for urgency. Negative for dysuria, flank pain and hematuria.  Musculoskeletal: Positive for joint pain and myalgias.  Skin: Negative for rash.  All other systems reviewed and are negative.   Past Medical History:  Diagnosis Date  . Arthritis   . Borderline diabetic   . Diabetes mellitus without complication ([place])   . Glaucoma   . Hyperlipidemia   . Hypertension   . PVD (peripheral vascular disease) ([place])   . Sleep apnea    Cpap ordered but doesnt use    Past Surgical History:  Procedure Laterality Date  . ABDOMINAL AORTOGRAM W/LOWER EXTREMITY N/A 07/27/2018   Procedure: ABDOMINAL AORTOGRAM W/LOWER EXTREMITY;  Surgeon: Lorretta Harp, MD;  Location: [facility];  Service: Cardiovascular;  Laterality: N/A;  . AMPUTATION Right 08/28/2018   Procedure: RIGHT BELOW KNEE AMPUTATION;  Surgeon: [name], MD;  Location: [facility];  Service: Orthopedics;  Laterality: Right;  . AMPUTATION Left 12/02/2018   Procedure: LEFT TRANSMETATARSAL AMPUTATION AND NEGATIVE PRESSURE WOUND VAC PLACEMENT;  Surgeon: [name], MD;  Location: [facility]  OR;  Service: Orthopedics;  Laterality: Left;  . APPENDECTOMY  1943  . CATARACT EXTRACTION  2012   x2  . COLONOSCOPY N/A 11/01/2014   Procedure: COLONOSCOPY;  Surgeon: Aviva Signs, MD;  [facility];  Service: Gastroenterology;  Laterality: N/A;  .  ESOPHAGOGASTRODUODENOSCOPY N/A 11/01/2014   Procedure: ESOPHAGOGASTRODUODENOSCOPY (EGD);  Surgeon: Aviva Signs, MD;  [facility];  Service: Gastroenterology;  Laterality: N/A;  . HERNIA REPAIR Left   . INGUINAL HERNIA REPAIR Right 03/01/2013   Procedure: RIGHT INGUINAL HERNIORRHAPHY;  Surgeon: Jamesetta So, MD;  Location: [facility];  Service: General;  Laterality: Right;  . INSERTION OF MESH Right 03/01/2013   Procedure: INSERTION OF MESH;  Surgeon: Jamesetta So, MD;  [facility];  Service: General;  Laterality: Right;  . LOWER EXTREMITY ANGIOGRAPHY Right 08/25/2018   Procedure: LOWER EXTREMITY ANGIOGRAPHY;  Surgeon: Wellington Hampshire, MD;  Location: [place] [facility];  Service: Cardiovascular;  Laterality: Right;  . LOWER EXTREMITY ANGIOGRAPHY N/A 11/25/2018   Procedure: LOWER EXTREMITY ANGIOGRAPHY;  Surgeon: Wellington Hampshire, MD;  Location: [facility];  Service: Cardiovascular;  Laterality: N/A;  . NM MYOCAR PERF WALL MOTION  06/05/2010   no significant ischemia  . PERIPHERAL VASCULAR ATHERECTOMY Right 08/03/2018   Procedure: PERIPHERAL VASCULAR ATHERECTOMY;  Surgeon: Lorretta Harp, MD;  Location: [facility];  Service: Cardiovascular;  Laterality: Right;  . PERIPHERAL VASCULAR ATHERECTOMY Left 11/23/2018   Procedure: PERIPHERAL VASCULAR ATHERECTOMY;  Surgeon: Lorretta Harp, MD;  Location: [facility] [place] [facility];  Service: Cardiovascular;  Laterality: Left;  sfa with dc balloon  . PERIPHERAL VASCULAR BALLOON ANGIOPLASTY Left 11/25/2018   Procedure: PERIPHERAL VASCULAR BALLOON ANGIOPLASTY;  Surgeon: Wellington Hampshire, MD;  Location: [facility];  Service: Cardiovascular;  Laterality: Left;  popliteal  . PERIPHERAL VASCULAR INTERVENTION Left 11/25/2018   Procedure: PERIPHERAL VASCULAR INTERVENTION;  Surgeon: Wellington Hampshire, MD;  Location: [facility];  Service: Cardiovascular;  Laterality: Left;  tibial peroneal trunk and peroneal stents   .  stent  06/14/2010   left leg  . US ECHOCARDIOGRAPHY  01/21/2006   moderate mitral annular ca+, mild MR, AOV moderately sclerotic.    Family History  Problem Relation Age of Onset  . Cancer Mother   . Heart attack Father   . Stroke Father   . Heart attack Brother   . Heart attack Brother     Social History:  Married. Wheelchair bound and needed assistance with ADLs and mobility. Per reports that he quit smoking about 53 years ago. He has quit using smokeless tobacco.  His smokeless tobacco use included chew. He reports that he does not drink alcohol or use drugs.    Allergies  Allergen Reactions  . Iodinated Diagnostic Agents     Other reaction(s): Fever  . Dye Fdc Red [Red Dye] Hives  . Iodine-131 Other (See Comments)    Breakout, fever  . Iohexol      Code: RASH, Desc: PT STATES HOT ALL OVER HAD TO HAVE ICE PACKS ALL OVER HIM AND DIFF BREATHING, PT REFUSED IV DYE    Medications Prior to Admission  Medication Sig Dispense Refill  . acetaminophen (TYLENOL) 325 MG tablet Take 1-2 tablets (325-650 mg total) by mouth every 6 (six) hours as needed for mild pain (pain score 1-3 or temp > 100.5).    [name] apixaban (ELIQUIS) 2.5 MG TABS tablet Take 1 tablet (2.5 mg total) by mouth  2 (two) times daily. 180 tablet 3  . aspirin EC 81 MG tablet Take 81 mg by mouth daily.    [name] atorvastatin (LIPITOR) 40 MG tablet Take 1 tablet (40 mg total) by mouth daily. 90 tablet 3  . dorzolamide-timolol (COSOPT) 22.3-6.8 MG/ML ophthalmic solution Place 1 drop into both eyes 2 (two) times daily.    [name] doxazosin (CARDURA) 2 MG tablet Take 1 tablet (2 mg total) by mouth at bedtime. 90 tablet 3  . famotidine (PEPCID) 20 MG tablet Take 1 tablet (20 mg total) by mouth daily. 60 tablet 0  . finasteride (PROSCAR) 5 MG tablet Take 1 tablet (5 mg total) by mouth every evening. 30 tablet 0  . furosemide (LASIX) 40 MG tablet Take 1 tablet (40 mg total) by mouth 2 (two) times daily. 30 tablet 1  . glipiZIDE (GLUCOTROL) 5  MG tablet Take 5 mg by mouth 2 (two) times daily.     [name] levothyroxine (SYNTHROID) 50 MCG tablet Take 1 tablet (50 mcg total) by mouth daily before breakfast. 30 tablet 0  . Omega-3 Fatty Acids (FISH OIL PO) Take 1 capsule by mouth daily.    . potassium chloride (KLOR-CON) 10 MEQ tablet Take 1 tablet (10 mEq total) by mouth daily. 30 tablet 1  . povidone-Iodine (BETADINE) 5 % SOLN topical solution Apply 1 application topically daily as needed for wound care.    [name] SANTYL ointment Apply 1 application topically daily.     . tamsulosin (FLOMAX) 0.4 MG CAPS capsule Take 1 capsule (0.4 mg total) by mouth 2 (two) times daily. 60 capsule 0  . vitamin C (ASCORBIC ACID) 500 MG tablet Take 500 mg by mouth daily.    . vitamin E 400 UNIT capsule Take 400 Units by mouth daily.      Drug Regimen Review Drug regimen was reviewed and remains appropriate with no significant issues identified    Home: Home Living Family/patient expects to be discharged to:: Inpatient rehab Living Arrangements: (daughter and son providng 24/7 assist Daughter weekends) Available Help at Discharge: Family, Available 24 hours/day Type of Home: House Home Access: Ramped entrance Home Layout: One level Bathroom Shower/Tub: Multimedia programmer: Standard Bathroom Accessibility: Yes Home Equipment: Environmental consultant - 2 wheels, Wheelchair - manual, Bedside commode, Hospital bed Additional Comments: hoyer lift  Lives With: Alone(with family providng 24/7 assist)   Functional History: Prior Function Level of Independence: Needs assistance Gait / Transfers Assistance Needed: family using lift at home ADL's / Homemaking Assistance Needed: family assists bathing and dressing Communication / Swallowing Assistance Needed: HOH Comments: patietn wheelchair level since 02/2018  Functional Status:  Mobility: Bed Mobility Overal bed mobility: Needs Assistance Bed Mobility: Rolling Rolling: Mod assist, +2 for physical assistance  Supine to sit: Max assist, HOB elevated Sit to supine: Total assist, +2 for physical assistance General bed mobility comments: MOD A +2 for rolling to position lift pad; able to initiate but required MOD A to maintain position Transfers Overall transfer level: Needs assistance Equipment used: Ambulation equipment used Transfer via Lift Equipment: Maximove Transfers: Sit to/from Stand, Lateral/Scoot Transfers Sit to Stand: Total assist  Lateral/Scoot Transfers: Max assist General transfer comment: total A +2 for maxi move lift to chair Ambulation/Gait General Gait Details: not ambulatory yet since amp of R LE  ADL: ADL Overall ADL's : Needs assistance/impaired Eating/Feeding: Supervision/ safety, Sitting Grooming: Oral care, Sitting, Supervision/safety, Set up Upper Body Bathing: Moderate assistance Lower Body Bathing: Maximal assistance Upper Body Dressing : Maximal assistance  Lower Body Dressing: Maximal assistance Toilet Transfer: +2 for safety/equipment, Total assistance Toilet Transfer Details (indicate cue type and reason): currently using condom cath and bed pan, assist with rolling for bed pan; total A +2 for simulated toilet tranfer to recliner with [place] and Hygiene: Maximal assistance, +2 for safety/equipment, Bed level Toileting - Clothing Manipulation Details (indicate cue type and reason): Pt assists with rolling for peri care General ADL Comments: session focus on seated grooming in chair; set-up/ supervision assist needed  Cognition: Cognition Overall Cognitive Status: Within Functional Limits for tasks assessed Orientation Level: Oriented X4 Cognition Arousal/Alertness: Awake/alert Behavior During Therapy: WFL for tasks assessed/performed Overall Cognitive Status: Within Functional Limits for tasks assessed  Physical Exam: Blood pressure 136/61, pulse 64, temperature 98.2 F (36.8 C), temperature source Oral, resp. rate  19, height 5\' 11"  (1.803 m), weight 96 kg, SpO2 96 %. Physical Exam  Gen:in pain, frail HEENT: oral mucosa pink and moist, NCAT Cardio: Reg rate Chest: normal effort, normal rate of breathing Abd: soft, non-distended Ext: no edema Skin: Right BKA well-healed.  Left transmetatarsal amputation site dressed with wound VAC in place.  Appropriately tender  Neuro: Patient resting comfortably.  Make good eye contact with examiner.  He is a bit confused but was able to provide his name and age with some delay in processing.   Musculoskeletal: 4/5 in upper extremities bilaterally. Patient is unable to participate in lower extremity testing as is currently in pain from wound vac dressing change. Psych: pleasant, normal affect   Results for orders placed or performed during the hospital encounter of 11/18/18 (from the past 48 hour(s))  Glucose, capillary     Status: Abnormal   Collection Time: 12/07/18  9:21 PM  Result Value Ref Range   Glucose-Capillary 216 (H) 70 - 99 mg/dL  CBC     Status: Abnormal   Collection Time: 12/08/18  3:53 AM  Result Value Ref Range   WBC 13.1 (H) 4.0 - 10.5 K/uL   RBC 3.03 (L) 4.22 - 5.81 MIL/uL   Hemoglobin 9.2 (L) 13.0 - 17.0 g/dL   HCT 27.4 (L) 39.0 - 52.0 %   MCV 90.4 80.0 - 100.0 fL   MCH 30.4 26.0 - 34.0 pg   MCHC 33.6 30.0 - 36.0 g/dL   RDW 16.2 (H) 11.5 - 15.5 %   Platelets 246 150 - 400 K/uL   nRBC 0.0 0.0 - 0.2 %    Comment: Performed at [facility], [address] [place], [place] [address]  Glucose, capillary     Status: Abnormal   Collection Time: 12/08/18  7:44 AM  Result Value Ref Range   Glucose-Capillary 127 (H) 70 - 99 mg/dL  Glucose, capillary     Status: Abnormal   Collection Time: 12/08/18 11:39 AM  Result Value Ref Range   Glucose-Capillary 185 (H) 70 - 99 mg/dL  Urinalysis, Routine w reflex microscopic     Status: Abnormal   Collection Time: 12/08/18  3:00 PM  Result Value Ref Range   Color, Urine YELLOW YELLOW    APPearance HAZY (A) CLEAR   Specific Gravity, Urine 1.010 1.005 - 1.030   pH 6.0 5.0 - 8.0   Glucose, UA NEGATIVE NEGATIVE mg/dL   Hgb urine dipstick NEGATIVE NEGATIVE   Bilirubin Urine NEGATIVE NEGATIVE   Ketones, ur NEGATIVE NEGATIVE mg/dL   Protein, ur NEGATIVE NEGATIVE mg/dL   Nitrite NEGATIVE NEGATIVE   Leukocytes,Ua MODERATE (A) NEGATIVE   RBC /  HPF 0-5 0 - 5 RBC/hpf   WBC, UA 6-10 0 - 5 WBC/hpf   Bacteria, UA RARE (A) NONE SEEN   Squamous Epithelial / LPF 0-5 0 - 5   Mucus PRESENT    Budding Yeast PRESENT     Comment: Performed at [facility], [place] [address], [place], [place] [address]  Glucose, capillary     Status: Abnormal   Collection Time: 12/08/18  3:54 PM  Result Value Ref Range   Glucose-Capillary 218 (H) 70 - 99 mg/dL  Glucose, capillary     Status: Abnormal   Collection Time: 12/08/18  9:44 PM  Result Value Ref Range   Glucose-Capillary 187 (H) 70 - 99 mg/dL  Glucose, capillary     Status: Abnormal   Collection Time: 12/08/18 11:40 PM  Result Value Ref Range   Glucose-Capillary 153 (H) 70 - 99 mg/dL  CBC     Status: Abnormal   Collection Time: 12/09/18  3:43 AM  Result Value Ref Range   WBC 12.7 (H) 4.0 - 10.5 K/uL   RBC 3.02 (L) 4.22 - 5.81 MIL/uL   Hemoglobin 9.3 (L) 13.0 - 17.0 g/dL   HCT 27.7 (L) 39.0 - 52.0 %   MCV 91.7 80.0 - 100.0 fL   MCH 30.8 26.0 - 34.0 pg   MCHC 33.6 30.0 - 36.0 g/dL   RDW 15.9 (H) 11.5 - 15.5 %   Platelets 221 150 - 400 K/uL   nRBC 0.0 0.0 - 0.2 %    Comment: Performed at [facility], [place] [address], [place], [place] [address]  Basic metabolic panel     Status: Abnormal   Collection Time: 12/09/18  3:43 AM  Result Value Ref Range   Sodium 130 (L) 135 - 145 mmol/L   Potassium 3.8 3.5 - 5.1 mmol/L   Chloride 97 (L) 98 - 111 mmol/L   CO2 24 22 - 32 mmol/L   Glucose, Bld 135 (H) 70 - 99 mg/dL   BUN 23 8 - 23 mg/dL   Creatinine, Ser 1.37 (H) 0.61 - 1.24 mg/dL   Calcium 8.7 (L) 8.9 - 10.3 mg/dL   GFR calc non  Af Amer 45 (L) >60 mL/min   GFR calc Af Amer 53 (L) >60 mL/min   Anion gap 9 5 - 15    Comment: Performed at [facility] [address], [place], [place] [address]  Brain natriuretic peptide     Status: Abnormal   Collection Time: 12/09/18  3:43 AM  Result Value Ref Range   B Natriuretic Peptide 249.1 (H) 0.0 - 100.0 pg/mL    Comment: Performed at [facility] [address], [place], [place] [address]  Glucose, capillary     Status: Abnormal   Collection Time: 12/09/18  7:47 AM  Result Value Ref Range   Glucose-Capillary 149 (H) 70 - 99 mg/dL  Glucose, capillary     Status: Abnormal   Collection Time: 12/09/18 11:24 AM  Result Value Ref Range   Glucose-Capillary 181 (H) 70 - 99 mg/dL  Glucose, capillary     Status: Abnormal   Collection Time: 12/09/18  4:11 PM  Result Value Ref Range   Glucose-Capillary 147 (H) 70 - 99 mg/dL   Dg Chest 2 View  Result Date: 12/09/2018 CLINICAL DATA:  Follow-up from previous exam EXAM: CHEST - 2 VIEW COMPARISON:  12/08/2018 FINDINGS: Cardiac shadow is enlarged but stable. Aortic calcifications are again seen. Bibasilar effusions and  atelectasis/infiltrate are again noted left slightly greater than right stable from recent exam. No acute bony abnormality is noted. IMPRESSION: Stable bibasilar effusions and atelectasis/infiltrate. Electronically Signed   By: Inez Catalina M.D.   On: 12/09/2018 19:25   Dg Chest Port 1 View  Result Date: 12/08/2018 CLINICAL DATA:  Leukocytosis. EXAM: PORTABLE CHEST 1 VIEW COMPARISON:  11/18/2018. FINDINGS: Stable cardiomegaly. Bibasilar pulmonary infiltrates/edema, progressed from prior exam. Bilateral moderate pleural effusions again noted. No pneumothorax. IMPRESSION: Stable cardiomegaly. Bibasilar pulmonary infiltrates/edema, progressed from prior exam. Progressive CHF and/or pneumonia could present this fashion. Bilateral moderate pleural effusions again noted without interim change. Electronically Signed    By: Marcello Moores  Register   On: 12/08/2018 09:46    Medical Problem List and Plan: 1.  Decreased functional mobility secondary to left transmetatarsal amputation 12/02/2018 with wound VAC applied as well as history of right BKA August 2020  -Will consult with surgical team to determine when patient is cleared to shower.   -ELOS/Goals: MinA in PT and ADLs, independent in SLP  Sleeping well, pain controlled, moving bowels. 2.  Antithrombotics: -DVT/anticoagulation: Eliquis  -antiplatelet therapy: Plavix 75 mg daily 3. Pain Management: Ultram as needed. Well controlled at this time. 4. Mood: Provide emotional support  -antipsychotic agents: N/A 5. Neuropsych: This patient is capable of making decisions on his own behalf. 6. Skin/Wound Care: Routine skin checks 7. Fluids/Electrolytes/Nutrition:  Monitor I/O. Check lytes in am.  8.  Acute blood loss anemia.  Follow-up CBC--history of heme positive stools.  Continue iron supplement 9.  PAF.  Cardiac rate controlled.  Continue Eliquis.  Follow-up cardiology services as needed 10.  Diastolic congestive heart failure.  Monitor weight daily--have been inconsistent. Will continue Lasix 20 mg daily--s/p IV lasix 20 mg 11/24. Was on 40 mg bid PTA due to recent decompensation.  Will recheck CXR and may need to resume due to progressive pleural effusions.  11.  Hypertension.  Norvasc 10 mg daily.  Monitor with increased mobility 12.  Diabetes mellitus with peripheral neuropathy.  Latest hemoglobin A1c 7.0.  SSI.  Patient on glipizide hold. 13.  Hypothyroidism. On Synthroid for supplement 14.  BPH: Monitor voiding with PVR checks. Continue Proscar 5 mg daily, Flomax 0.4 mg twice daily.   15.  Hyperlipidemia.  Lipitor  16. Leucocytosis/?PNA: Monitor cough--on Ceftriaxone/Azithormycin D#2 17. CKD stage III: Will monitor renal status with serial checks.   Reesa Chew, PA  I have personally performed a face to face diagnostic evaluation, including, but not  limited to relevant history and physical exam findings, of this patient and developed relevant assessment and plan.  Additionally, I have reviewed and concur with the physician assistant's documentation above.  The patient's status has not changed. The original post admission physician evaluation remains appropriate, and any changes from the pre-admission screening or documentation from the acute chart are noted above.   Leeroy Cha, MD

## 2018-12-09 NOTE — TOC Transition Note (Signed)
Transition of Care [facility]) - CM/SW Discharge Note   Patient Details  Name: [name] MRN: [id number] Date of Birth: 1927/04/16  Transition of [facility] CM/SW Contact:  Bethena Roys, RN Phone Number: 12/09/2018, 1:36 PM   Clinical Narrative:  Insurance approved patient for CIR-Patient will transition to CIR later this afternoon. No further needs from CM at this time.   Final next level of care: [place] Barriers to Discharge: Continued Medical Work up   Patient Goals and CMS Choice Patient states their goals for this hospitalization and ongoing recovery are:: "to return home with family"   Choice offered to / list presented to : NA   Discharge Plan and Services In-house Referral: NA Discharge Planning Services: CM Consult Post Acute Care Choice: Home Health, Resumption of Svcs/PTA Provider(Active with [facility])               HH Arranged: RN [place] Agency: [place] ([place]) Date [place]: 11/26/18 Time HH Agency Contacted: 2 Representative spoke with at [place]: [place] ([place]) Interventions     Readmission Risk Interventions Readmission Risk Prevention Plan 11/26/2018 11/21/2018 11/12/2018  Transportation Screening Complete Complete -  PCP or Specialist Appt within 3-5 Days - - -  Not Complete comments - - -  HRI or [place] or Home Care Consult comments - - -  Social Work Scientific laboratory technician for [place] Planning/Counseling - - -  Palliative Care Screening - - -  Medication Review Press photographer) Complete Referral to Pharmacy -  PCP or Specialist appointment within 3-5 days of discharge Complete Complete Complete  HRI or Home Care Consult Complete Complete -  SW Recovery Care/Counseling Consult Complete Complete -  Palliative Care Screening Not Applicable Not Applicable -  [place] Not Applicable Not Complete Not Applicable  Some recent  data might be hidden

## 2018-12-09 NOTE — Progress Notes (Signed)
Doing well pain controlled.  1 week post op transmetatarsal Amputation.  Vac in Place no further drainage. Still 50. Dressing CDI.   SNF CIR possibly.  Will discuss removing vac with Dr. Sharol Given

## 2018-12-09 NOTE — Progress Notes (Signed)
Izora Ribas, MD  Physician  Physical Medicine and Rehabilitation  PMR Pre-admission  Signed  Date of Service:  12/08/2018 11:50 AM      Related encounter: ED to Hosp-Admission (Current) from 11/18/2018 in [facility]      Signed         Show:Clear all [x] Manual[x] Template[x] Copied  Added by: [x] Cristina Gong, RN[x] Raulkar, Clide Deutscher, MD  [] Hover for details PMR Admission Coordinator Pre-Admission Assessment  Patient: [name] is an 85 y.o., male MRN: [id number] DOB: Jun 18, 1927 Height: 5' 10"  (177.8 cm) Weight: 94.6 kg  Insurance Information HMO: yes    PPO:      PCP:      IPA:      80/20:      OTHER: medicare advantage plan PRIMARY: [place] [employer]      Policy#: [id number]      Subscriber: pt CM Name: T[facility] [place]   [facility] [place]    Phone#: [phone] option #3     Fax#: [phone] Pre-Cert#: [id number] initial denied and overturned on expedited appeal with Myrtice Lauth  Phone [phone] fax [phone] approved for 7 days with updates to [facility]      Employer:  Benefits:  Phone #: [phone]     Name: 11/20 Eff. Date: 01/14/2018     Deduct: none      Out of Pocket Max: $3600      Life Max: none CIR: $295 co pay per day days 1 until 5      SNF: no co pay days 1 until 20; $160 co pay per day days 21 until 43; no co pay days 44 until 100 Outpatient: $30 co py per visit     Co-Pay: visits per medical neccesity Home Health: 100%      Co-Pay: visits per medical neccesity DME: 80%     Co-Pay: 20% Providers: in network  SECONDARY: none       Medicaid Application Date:       Case Manager:  Disability Application Date:       Case Worker:   The "Data Collection Information Summary" for patients in Inpatient Rehabilitation Facilities with attached "Privacy Act [place] Records" was provided and verbally reviewed with: Patient and Family  Emergency Contact Information         Contact Information    Name Relation  Home Work [place], [place] Son   [phone]   [name] Daughter   [phone]   Myers,Betty Sister [phone]     Melanie, [place]   [phone]      Current Medical History  Patient Admitting Diagnosis: Left TMA; old R BKA  History of Present Illness: 85 year old right-handed male well-known to rehab services with CIR admission 09/07/2018 until 09/22/2018 for right BKAsecondary to peripheral vascular diseaseand was discharged home requiring minimal assist for mobility as well as history of diabetes mellitus,diastolic congestive heart failure, CKD stage III, PAF maintained on Eliquis followed by Dr. Chinita Greenland, hyperlipidemia. Family was using a lift to help aid in transfers at home.Presented 11/18/2018 with black discoloration of first toe left foot. Patient had been followed at the wound care center. Noted progressive redness of the dorsum of the left foot as well as skin ulcer 2 to 3 mm on the lateral aspect of the left foot. X-rays revealed no acute osseous abnormality in the left foot. Admission chemistries with sodium 132, BUN 31, creatinine 1.27, hemoglobin 11.5, WBC 10,800, lactic acid within normal limits 1.1, sedimentation rate elevated at  84, blood cultures no growth to date. Patient was started on IV vancomycin and Zosyn. Follow-up orthopedic services Dr. Sharol Given for gangrenous left foot and limb not felt to be salvageable. Patient received cardiac clearance and underwent left transmetatarsal amputation and placement of wound VAC 12/02/2018 per Dr. Sharol Given. Hospital course pain management. Postoperative Eliquis resumed as well as Plavix. His low-dose aspirin was discontinued after discussing with cardiology services. Acute blood loss anemia 9.9 and monitored. Wound Vac removed 11/25. He did develop leucocytosis with cough and CXR 11/24 showed progressive CHF/PNA. He was treated with IV diuresis 11/24 and was started on broad spectrum antibiotics  due to concerns of PNA. Diarrhea improving off laxatives. Of note, patient did have contact with his daughter 11/15 and she tested COVID positive on 11/18--> he was tested on 11/20 and is COVID negative.   Patient's medical record from [facility] has been reviewed by the rehabilitation admission coordinator and physician.  Past Medical History      Past Medical History:  Diagnosis Date  . Arthritis   . Borderline diabetic   . Diabetes mellitus without complication ([place])   . Glaucoma   . Hyperlipidemia   . Hypertension   . PVD (peripheral vascular disease) ([place])   . Sleep apnea    Cpap ordered but doesnt use    Family History   family history includes Cancer in his mother; Heart attack in his brother, brother, and father; Stroke in his father.  Prior Rehab/Hospitalizations Has the patient had prior rehab or hospitalizations prior to admission? Yes  CIR 08/2018 Dr. Florentina Jenny Team  Has the patient had major surgery during 100 days prior to admission? Yes             Current Medications  Current Facility-Administered Medications:  .  0.9 %  sodium chloride infusion, 250 mL, Intravenous, PRN, Persons, Bevely Palmer, PA, Last Rate: 10 mL/hr at 11/26/18 1031, 250 mL at 11/26/18 1031 .  0.9 %  sodium chloride infusion, 250 mL, Intravenous, PRN, Persons, Bevely Palmer, PA .  acetaminophen (TYLENOL) tablet 500 mg, 500 mg, Oral, TID, Regalado, Belkys A, MD, 500 mg at 12/09/18 0906 .  acetaminophen (TYLENOL) tablet 650 mg, 650 mg, Oral, Q4H PRN, Persons, Bevely Palmer, [place], 650 mg at 12/08/18 1855 .  amLODipine (NORVASC) tablet 10 mg, 10 mg, Oral, Daily, Persons, Bevely Palmer, PA, 10 mg at 12/09/18 1110 .  apixaban (ELIQUIS) tablet 5 mg, 5 mg, Oral, BID, Regalado, Belkys A, MD, 5 mg at 12/09/18 1111 .  atorvastatin (LIPITOR) tablet 40 mg, 40 mg, Oral, Daily, Persons, Bevely Palmer, PA, 40 mg at 12/09/18 1110 .  azithromycin (ZITHROMAX) tablet 500 mg, 500 mg, Oral, Q24H, Regalado,  Belkys A, MD, 500 mg at 12/08/18 1523 .  cefTRIAXone (ROCEPHIN) 1 g in sodium chloride 0.9 % 100 mL IVPB, 1 g, Intravenous, Q24H, Regalado, Belkys A, MD, Stopped at 12/08/18 1620 .  clopidogrel (PLAVIX) tablet 75 mg, 75 mg, Oral, Q breakfast, Persons, Bevely Palmer, [place], 75 mg at 12/09/18 0902 .  dorzolamide-timolol (COSOPT) 22.3-6.8 MG/ML ophthalmic solution 1 drop, 1 drop, Both Eyes, BID, Persons, Bevely Palmer, [place], 1 drop at 12/09/18 1113 .  famotidine (PEPCID) tablet 20 mg, 20 mg, Oral, Daily, Persons, Bevely Palmer, [place], 20 mg at 12/09/18 1111 .  finasteride (PROSCAR) tablet 5 mg, 5 mg, Oral, QPM, Persons, Bevely Palmer, PA, 5 mg at 12/08/18 1715 .  furosemide (LASIX) tablet 20 mg, 20 mg, Oral, Daily, Persons, Bevely Palmer, PA, 20 mg  at 12/09/18 1111 .  insulin aspart (novoLOG) injection 0-15 Units, 0-15 Units, Subcutaneous, TID WC, Persons, Bevely Palmer, [place], 3 Units at 12/09/18 1238 .  insulin aspart (novoLOG) injection 0-5 Units, 0-5 Units, Subcutaneous, QHS, Persons, Bevely Palmer, [place], 2 Units at 12/07/18 2205 .  iron polysaccharides (NIFEREX) capsule 150 mg, 150 mg, Oral, Daily, Regalado, Belkys A, MD, 150 mg at 12/09/18 1111 .  levothyroxine (SYNTHROID) tablet 50 mcg, 50 mcg, Oral, Q0600, Persons, Bevely Palmer, [place], 50 mcg at 12/09/18 7867 .  morphine 2 MG/ML injection 1 mg, 1 mg, Intravenous, Q4H PRN, Regalado, Belkys A, MD, 1 mg at 12/09/18 1205 .  ondansetron (ZOFRAN) injection 4 mg, 4 mg, Intravenous, Q6H PRN, Persons, Bevely Palmer, PA .  potassium chloride (KLOR-CON) CR tablet 10 mEq, 10 mEq, Oral, Daily, Persons, Bevely Palmer, [place], 10 mEq at 12/09/18 1112 .  sodium chloride flush (NS) 0.9 % injection 10-40 mL, 10-40 mL, Intracatheter, Q12H, Persons, Bevely Palmer, PA, 10 mL at 12/09/18 1114 .  sodium chloride flush (NS) 0.9 % injection 10-40 mL, 10-40 mL, Intracatheter, PRN, Persons, Bevely Palmer, PA .  tamsulosin (FLOMAX) capsule 0.4 mg, 0.4 mg, Oral, BID, Persons, Bevely Palmer, PA, 0.4 mg at 12/09/18 1112 .  traMADol (ULTRAM)  tablet 50 mg, 50 mg, Oral, Q6H PRN, Regalado, Belkys A, MD, 50 mg at 12/09/18 1119 .  vitamin B-12 (CYANOCOBALAMIN) tablet 100 mcg, 100 mcg, Oral, Daily, Regalado, Belkys A, MD, 100 mcg at 12/09/18 1112  Patients Current Diet:     Diet Order                  Diet Carb Modified Fluid consistency: Thin; Room service appropriate? Yes  Diet effective now               Precautions / Restrictions Precautions Precautions: Fall Precaution Comments: wound vac L foot, preexisting R BKA Other Brace: cast shoe to protect L foot Restrictions Weight Bearing Restrictions: Yes LLE Weight Bearing: Non weight bearing Other Position/Activity Restrictions: R BK amp is healed   Has the patient had 2 or more falls or a fall with injury in the past year? No  Prior Activity Level Limited Community (1-2x/wk): wheelchair level, family using lift pta due to LLE issues recently  Prior Functional Level Self Care: Did the patient need help bathing, dressing, using the toilet or eating? Needed some help  Indoor Mobility: Did the patient need assistance with walking from room to room (with or without device)? Needed some help  Stairs: Did the patient need assistance with internal or external stairs (with or without device)? Needed some help  Functional Cognition: Did the patient need help planning regular tasks such as shopping or remembering to take medications? Needed some help  [place] / [place] Devices/Equipment: Eyeglasses, Dentures (specify type), Hospital bed, [employer], Environmental consultant (specify type), Bedside commode/3-in-1 Home Equipment: Walker - 2 wheels, Wheelchair - manual, Bedside commode, Hospital bed  Prior Device Use: Indicate devices/aids used by the patient prior to current illness, exacerbation or injury? Manual wheelchair  Current Functional Level Cognition  Overall Cognitive Status: Within Functional Limits for tasks assessed  Orientation Level: Oriented X4    Extremity Assessment (includes Sensation/Coordination)  Upper Extremity Assessment: Overall WFL for tasks assessed  Lower Extremity Assessment: Generalized weakness, LLE deficits/detail, RLE deficits/detail RLE Deficits / Details: Increased challenge with SLR despite prior BKA LLE Deficits / Details: unable to fully assess due to NWB status, pt able to complete SLR to  facilitate bed mobility LLE: Unable to fully assess due to pain    ADLs  Overall ADL's : Needs assistance/impaired Eating/Feeding: Supervision/ safety, Sitting Grooming: Oral care, Sitting, Supervision/safety, Set up Upper Body Bathing: Moderate assistance Lower Body Bathing: Maximal assistance Upper Body Dressing : Maximal assistance Lower Body Dressing: Maximal assistance Toilet Transfer: +2 for safety/equipment, Total assistance Toilet Transfer Details (indicate cue type and reason): currently using condom cath and bed pan, assist with rolling for bed pan; total A +2 for simulated toilet tranfer to recliner with [place] and Hygiene: Maximal assistance, +2 for safety/equipment, Bed level Toileting - Clothing Manipulation Details (indicate cue type and reason): Pt assists with rolling for peri care General ADL Comments: session focus on seated grooming in chair; set-up/ supervision assist needed    Mobility  Overal bed mobility: Needs Assistance Bed Mobility: Rolling Rolling: Mod assist, +2 for physical assistance Supine to sit: Max assist, HOB elevated Sit to supine: Total assist, +2 for physical assistance General bed mobility comments: MOD A +2 for rolling to position lift pad; able to initiate but required MOD A to maintain position    Transfers  Overall transfer level: Needs assistance Equipment used: Ambulation equipment used Transfer via Lift Equipment: Maximove Transfers: Sit to/from Stand, Lateral/Scoot Transfers Sit to Stand:  Total assist  Lateral/Scoot Transfers: Max assist General transfer comment: total A +2 for maxi move lift to chair    Ambulation / Gait / Stairs / Wheelchair Mobility  Ambulation/Gait General Gait Details: not ambulatory yet since amp of R LE    Posture / Balance Dynamic Sitting Balance Sitting balance - Comments: able to reach forward from recliner to gather ADL items Balance Overall balance assessment: Needs assistance Sitting-balance support: No upper extremity supported Sitting balance-Leahy Scale: Poor Sitting balance - Comments: able to reach forward from recliner to gather ADL items Postural control: Right lateral lean(as he fatigued) Standing balance-Leahy Scale: Zero Standing balance comment: NT, in maximove    Special needs/care consideration BiPAP/CPAP  CPM  Continuous Drip IV  Dialysis         Days  Life Vest  Oxygen  Special Bed  Trach Size  Wound Vac (area) left LE surgical site Skin  removed on 11/25 Bowel mgmt: LBM 11/24 ; continent Bladder mgmt: l catheter Diabetic mgmt: yes Behavioral consideration  Chemo/radiation  Designated visitor is son , [name]. I verified on day of admit that he could not rotate with his sister on weekend for visitation   Previous Home Environment  Living Arrangements: (daughter and son providng 24/7 assist Daughter weekends)  Lives With: Alone(with family providng 24/7 assist) Available Help at Discharge: Family, Available 24 hours/day Type of Home: House Home Layout: One level Home Access: Ramped entrance Bathroom Shower/Tub: Multimedia programmer: Standard Bathroom Accessibility: Yes How Accessible: Accessible via walker [place]: Yes Type of [place] (if known): advance home care Additional Comments: hoyer lift  Discharge Living Setting Plans for Discharge Living Setting: Patient's home, Alone(with family providing 24/7 assist rotating) Type of Home at  Discharge: House Discharge Home Layout: One level Discharge Home Access: [place] entrance Discharge Bathroom Shower/Tub: Walk-in shower Discharge Bathroom Toilet: Standard Discharge Bathroom Accessibility: Yes How Accessible: Accessible via walker Does the patient have any problems obtaining your medications?: No  Social/Family/Support Systems Patient Roles: Parent Contact Information: son and daughter Anticipated Caregiver: son and daughter Anticipated Caregiver's Contact Information: see above Ability/Limitations of Caregiver: Daughter and her  family recently with COVID 55, son not wiht COVID Caregiver Availability: 24/7 Discharge Plan Discussed with Primary Caregiver: Yes Is Caregiver In Agreement with Plan?: Yes Does Caregiver/Family have Issues with Lodging/Transportation while Pt is in Rehab?: No    Goals/Additional Needs Patient/Family Goal for Rehab: min asisst with PT and OT at wheelchair level Expected length of stay: ELOS 10 to 14 days Special Service Needs: HOH Pt/Family Agrees to Admission and willing to participate: Yes Program Orientation Provided & Reviewed with Pt/Caregiver Including Roles  & Responsibilities: Yes  Decrease burden of Care through IP rehab admission: n/a  Possible need for SNF placement upon discharge: not anticipated  Patient Condition: I have reviewed medical records from [facility] , spoken with CM, and patient and son. I met with patient at the bedside for inpatient rehabilitation assessment.  Patient will benefit from ongoing PT and OT, can actively participate in 3 hours of therapy a day 5 days of the week, and can make measurable gains during the admission.  Patient will also benefit from the coordinated team approach during an Inpatient Acute Rehabilitation admission.  The patient will receive intensive therapy as well as Rehabilitation physician, nursing, social worker, and care management interventions.  Due to bladder  management, bowel management, safety, skin/wound care, disease management, medication administration, pain management and patient education the patient requires 24 hour a day rehabilitation nursing.  The patient is currently mod to max assist with mobility and basic ADLs.  Discharge setting and therapy post discharge at home with home health is anticipated.  Patient has agreed to participate in the Acute Inpatient Rehabilitation Program and will admit today.  Preadmission Screen Completed By: Danne Baxter, RN MSN Cleatrice Burke, 12/09/2018 1:25 PM ______________________________________________________________________   Discussed status with Dr. Ranell Patrick on  12/09/2018 at  1324 and received approval for admission today.  Admission Coordinator:  Cleatrice Burke, RN, time  5170 Date  12/09/2018   Assessment/Plan: Diagnosis: Impaired mobility and ADLs secondary to left metatarsal amputation 1. Does the need for close, 24 hr/day Medical supervision in concert with the patient's rehab needs make it unreasonable for this patient to be served in a less intensive setting? Yes 2. Co-Morbidities requiring supervision/potential complications: right BKA, diastolic CHF stage 3, PAF, HTN, HLD 3. Due to bladder management, bowel management, safety, skin/wound care, disease management, medication administration, pain management and patient education, does the patient require 24 hr/day rehab nursing? Yes 4. Does the patient require coordinated care of a physician, rehab nurse, PT, OT, and SLP to address physical and functional deficits in the context of the above medical diagnosis(es)? Yes Addressing deficits in the following areas: balance, endurance, locomotion, strength, transferring, bowel/bladder control and bathing 5. Can the patient actively participate in an intensive therapy program of at least 3 hrs of therapy 5 days a week? Yes 6. The potential for patient to make measurable gains  while on inpatient rehab is excellent 7. Anticipated functional outcomes upon discharge from inpatient rehab: modified independent PT, modified independent OT, independent SLP 8. Estimated rehab length of stay to reach the above functional goals is: 10-14 days 9. Anticipated discharge destination: Home 10. Overall Rehab/Functional Prognosis: excellent   MD Signature: Leeroy Cha, MD        Revision History

## 2018-12-09 NOTE — Progress Notes (Signed)
Inpatient Rehabilitation Admissions Coordinator  Expedited appeal has overturned decision and approved pt for an inpt rehab admit to CIR today. I met with patient and son at bedside and they are in agreement. I have contacted RN CM and Dr. Cyndia Skeeters and will make the arrangements to admit today.  Danne Baxter, RN, MSN Rehab Admissions Coordinator [phone] 12/09/2018 1:19 PM

## 2018-12-09 NOTE — Progress Notes (Signed)
Wound Vac removed. Some skin maceration. Some erythema  And some bleeding from wound. No foul odor dressing applied.

## 2018-12-10 DIAGNOSIS — Z89432 Acquired absence of left foot: Secondary | ICD-10-CM

## 2018-12-10 LAB — COMPREHENSIVE METABOLIC PANEL
ALT: 22 U/L (ref 0–44)
AST: 36 U/L (ref 15–41)
Albumin: 2.9 g/dL — ABNORMAL LOW (ref 3.5–5.0)
Alkaline Phosphatase: 44 U/L (ref 38–126)
Anion gap: 12 (ref 5–15)
BUN: 21 mg/dL (ref 8–23)
CO2: 25 mmol/L (ref 22–32)
Calcium: 8.8 mg/dL — ABNORMAL LOW (ref 8.9–10.3)
Chloride: 94 mmol/L — ABNORMAL LOW (ref 98–111)
Creatinine, Ser: 1.48 mg/dL — ABNORMAL HIGH (ref 0.61–1.24)
GFR calc Af Amer: 48 mL/min — ABNORMAL LOW (ref >60)
GFR calc non Af Amer: 41 mL/min — ABNORMAL LOW (ref >60)
Glucose, Bld: 132 mg/dL — ABNORMAL HIGH (ref 70–99)
Potassium: 4.6 mmol/L (ref 3.5–5.1)
Sodium: 131 mmol/L — ABNORMAL LOW (ref 135–145)
Total Bilirubin: 0.7 mg/dL (ref 0.3–1.2)
Total Protein: 6.4 g/dL — ABNORMAL LOW (ref 6.5–8.1)

## 2018-12-10 LAB — CBC WITH DIFFERENTIAL/PLATELET
Abs Immature Granulocytes: 0.03 10*3/uL (ref 0.00–0.07)
Basophils Absolute: 0.1 10*3/uL (ref 0.0–0.1)
Basophils Relative: 1 %
Eosinophils Absolute: 0.6 10*3/uL — ABNORMAL HIGH (ref 0.0–0.5)
Eosinophils Relative: 5 %
HCT: 29.4 % — ABNORMAL LOW (ref 39.0–52.0)
Hemoglobin: 9.8 g/dL — ABNORMAL LOW (ref 13.0–17.0)
Immature Granulocytes: 0 %
Lymphocytes Relative: 25 %
Lymphs Abs: 2.8 10*3/uL (ref 0.7–4.0)
MCH: 30.9 pg (ref 26.0–34.0)
MCHC: 33.3 g/dL (ref 30.0–36.0)
MCV: 92.7 fL (ref 80.0–100.0)
Monocytes Absolute: 1 10*3/uL (ref 0.1–1.0)
Monocytes Relative: 9 %
Neutro Abs: 6.7 10*3/uL (ref 1.7–7.7)
Neutrophils Relative %: 60 %
Platelets: 265 10*3/uL (ref 150–400)
RBC: 3.17 MIL/uL — ABNORMAL LOW (ref 4.22–5.81)
RDW: 15.8 % — ABNORMAL HIGH (ref 11.5–15.5)
WBC: 11.1 10*3/uL — ABNORMAL HIGH (ref 4.0–10.5)
nRBC: 0 % (ref 0.0–0.2)

## 2018-12-10 LAB — GLUCOSE, CAPILLARY
Glucose-Capillary: 127 mg/dL — ABNORMAL HIGH (ref 70–99)
Glucose-Capillary: 151 mg/dL — ABNORMAL HIGH (ref 70–99)
Glucose-Capillary: 160 mg/dL — ABNORMAL HIGH (ref 70–99)
Glucose-Capillary: 185 mg/dL — ABNORMAL HIGH (ref 70–99)

## 2018-12-10 NOTE — Progress Notes (Signed)
[facility] PHYSICAL MEDICINE & REHABILITATION PROGRESS NOTE   Subjective/Complaints:  Patient reports meds help "sometimes" for pain; RLE is "fine" but still having pain in L TMA.  Per pt, bowels working OK.  ROS- denies SOB, CP, N/V/D/C; pain is controlled usually  Objective:   Dg Chest 2 View  Result Date: 12/09/2018 CLINICAL DATA:  Follow-up from previous exam EXAM: CHEST - 2 VIEW COMPARISON:  12/08/2018 FINDINGS: Cardiac shadow is enlarged but stable. Aortic calcifications are again seen. Bibasilar effusions and atelectasis/infiltrate are again noted left slightly greater than right stable from recent exam. No acute bony abnormality is noted. IMPRESSION: Stable bibasilar effusions and atelectasis/infiltrate. Electronically Signed   By: Inez Catalina M.D.   On: 12/09/2018 19:25   Dg Chest Port 1 View  Result Date: 12/08/2018 CLINICAL DATA:  Leukocytosis. EXAM: PORTABLE CHEST 1 VIEW COMPARISON:  11/18/2018. FINDINGS: Stable cardiomegaly. Bibasilar pulmonary infiltrates/edema, progressed from prior exam. Bilateral moderate pleural effusions again noted. No pneumothorax. IMPRESSION: Stable cardiomegaly. Bibasilar pulmonary infiltrates/edema, progressed from prior exam. Progressive CHF and/or pneumonia could present this fashion. Bilateral moderate pleural effusions again noted without interim change. Electronically Signed   By: Marcello Moores  Register   On: 12/08/2018 09:46   Recent Labs    12/09/18 0343 12/10/18 0504  WBC 12.7* 11.1*  HGB 9.3* 9.8*  HCT 27.7* 29.4*  PLT 221 265   Recent Labs    12/09/18 0343 12/10/18 0504  NA 130* 131*  K 3.8 4.6  CL 97* 94*  CO2 24 25  GLUCOSE 135* 132*  BUN 23 21  CREATININE 1.37* 1.48*  CALCIUM 8.7* 8.8*    Intake/Output Summary (Last 24 hours) at 12/10/2018 0857 Last data filed at 12/10/2018 0739 Gross per 24 hour  Intake 500 ml  Output -  Net 500 ml     Physical Exam: Vital Signs Blood pressure 133/61, pulse (!) 58, temperature  98.5 F (36.9 C), resp. rate 16, height 5\' 11"  (1.803 m), weight 96 kg, SpO2 96 %.  Physical Exam  Gen:in pain, frail; sitting up in bed; finished breakfast, NAD HEENT: oral mucosa pink and moist, NCAT Cardio: Regular rate Chest: normal effort, normal rate of breathing;  Abd: soft, non-distended; NT, ND, (+)BS Ext: no edema Skin: Right BKA well-healed.  Left transmetatarsal amputation site dressed with ACE wrap in place on L TMA.  Appropriately tender  Neuro: pt is vague and slow to process, but remembers examiner.  Musculoskeletal: 4/5 in upper extremities bilaterally. Patient is unable to participate in lower extremity testing  Psych: pleasant, normal affect    Assessment/Plan: 1. Functional deficits secondary to L TMA in setting of R older BKA which require 3+ hours per day of interdisciplinary therapy in a comprehensive inpatient rehab setting.  Physiatrist is providing close team supervision and 24 hour management of active medical problems listed below.  Physiatrist and rehab team continue to assess barriers to discharge/monitor patient progress toward functional and medical goals  Care Tool:  Bathing              Bathing assist       Upper Body Dressing/Undressing Upper body dressing   What is the patient wearing?: Hospital gown only    Upper body assist      Lower Body Dressing/Undressing Lower body dressing      What is the patient wearing?: Incontinence brief     Lower body assist Assist for lower body dressing: Moderate Assistance - Patient 50 - 74%     Toileting  Toileting    Toileting assist Assist for toileting: Moderate Assistance - Patient 50 - 74%     Transfers Chair/bed transfer  Transfers assist           Locomotion Ambulation   Ambulation assist              Walk 10 feet activity   Assist           Walk 50 feet activity   Assist           Walk 150 feet activity   Assist           Walk 10 feet  on uneven surface  activity   Assist           Wheelchair     Assist               Wheelchair 50 feet with 2 turns activity    Assist            Wheelchair 150 feet activity     Assist          Blood pressure 133/61, pulse (!) 58, temperature 98.5 F (36.9 C), resp. rate 16, height 5\' 11"  (1.803 m), weight 96 kg, SpO2 96 %.    Medical Problem List and Plan: 1.  Decreased functional mobility secondary to left transmetatarsal amputation 12/02/2018 with wound VAC applied as well as history of right BKA August 2020             -Will consult with surgical team to determine when patient is cleared to shower.   11/26- didn't see wound VAC that was mentioned; will d/w nursing- maybe under dressing?              -ELOS/Goals: MinA in PT and ADLs, independent in SLP             Sleeping well, pain controlled, moving bowels. 2.  Antithrombotics: -DVT/anticoagulation: Eliquis             -antiplatelet therapy: Plavix 75 mg daily 3. Pain Management: Ultram as needed.   11/26- pain meds work "sometimes" per pt- pt on Tylenol 650 mg q4 hours prn; tramadol 50 mg q6 hours prn; or oxycodone q4 hours prn. 4. Mood: Provide emotional support             -antipsychotic agents: N/A 5. Neuropsych: This patient is capable of making decisions on his own behalf. 6. Skin/Wound Care: Routine skin checks 7. Fluids/Electrolytes/Nutrition:  Monitor I/O. Check lytes in am.  8.  Acute blood loss anemia.  Follow-up CBC--history of heme positive stools.  Continue iron supplement 9.  PAF.  Cardiac rate controlled.  Continue Eliquis.  Follow-up cardiology services as needed 10.  Diastolic congestive heart failure.  Monitor weight daily--have been inconsistent. Will continue Lasix 20 mg daily--s/p IV lasix 20 mg 11/24. Was on 40 mg bid PTA due to recent decompensation.  Will recheck CXR and may need to resume due to progressive pleural effusions.   11/26- on Lasix 20 mg daily- will con't  and monitor daily weights   Filed Weights   12/09/18 1656  Weight: 96 kg   11.  Hypertension.  Norvasc 10 mg daily.  Monitor with increased mobility 12.  Diabetes mellitus with peripheral neuropathy.  Latest hemoglobin A1c 7.0.  SSI.  Patient on glipizide hold. CBG (last 3)  Recent Labs    12/09/18 1611 12/09/18 2116 12/10/18 0638  GLUCAP 147* 160* 127*    11/26- BGs adequate control  13.  Hypothyroidism. On Synthroid for supplement 14.  BPH: Monitor voiding with PVR checks. Continue Proscar 5 mg daily, Flomax 0.4 mg twice daily.   15.  Hyperlipidemia.  Lipitor  16. Leucocytosis/?PNA: Monitor cough--on Ceftriaxone/Azithormycin D#2  11/26- on Rocephin for CAP 1 g q24 hours currently- WBC down to 11.1k from 12.7k. 17. CKD stage III: Will monitor renal status with serial checks.   11/26- Cr up slightly to 1.48 18. Hyponatremia  11/26- Na 131 up from 130.   LOS: 1 days A FACE TO FACE EVALUATION WAS PERFORMED  Sharis Keeran 12/10/2018, 8:57 AM

## 2018-12-11 ENCOUNTER — Inpatient Hospital Stay (HOSPITAL_COMMUNITY): Payer: Medicare Other

## 2018-12-11 ENCOUNTER — Inpatient Hospital Stay (HOSPITAL_COMMUNITY): Payer: Medicare Other | Admitting: Occupational Therapy

## 2018-12-11 LAB — GLUCOSE, CAPILLARY
Glucose-Capillary: 127 mg/dL — ABNORMAL HIGH (ref 70–99)
Glucose-Capillary: 141 mg/dL — ABNORMAL HIGH (ref 70–99)
Glucose-Capillary: 157 mg/dL — ABNORMAL HIGH (ref 70–99)
Glucose-Capillary: 181 mg/dL — ABNORMAL HIGH (ref 70–99)

## 2018-12-11 NOTE — Plan of Care (Signed)
  Problem: Consults Goal: RH LIMB LOSS PATIENT EDUCATION Description: Description: See Patient Education module for eduction specifics. Outcome: Progressing   Problem: RH BLADDER ELIMINATION Goal: RH STG MANAGE BLADDER WITH ASSISTANCE Description: STG Manage Bladder With Assistance Min Outcome: Progressing   Problem: RH SKIN INTEGRITY Goal: RH STG SKIN FREE OF INFECTION/BREAKDOWN Description: Skin free of breakdown, infection Outcome: Progressing   Problem: RH PAIN MANAGEMENT Goal: RH STG PAIN MANAGED AT OR BELOW PT'S PAIN GOAL Description: Less than 4 Outcome: Progressing   Problem: RH KNOWLEDGE DEFICIT LIMB LOSS Goal: RH STG INCREASE KNOWLEDGE OF SELF CARE AFTER LIMB LOSS Description: Patient/family will be able to describe self care of limb including pain management, wound care and s/s of infection with cues/handouts Outcome: Progressing

## 2018-12-11 NOTE — Progress Notes (Signed)
Inpatient Rehabilitation  Patient information reviewed and entered into eRehab system by Mikya Don M. Markon Jares, M.A., CCC/SLP, PPS Coordinator.  Information including medical coding, functional ability and quality indicators will be reviewed and updated through discharge.    

## 2018-12-11 NOTE — Care Management (Signed)
[facility] Individual Statement of Services  Patient Name:  [name]  Date:  12/11/2018  Welcome to [facility].  Our goal is to provide you with an individualized program based on your diagnosis and situation, designed to meet your specific needs.  With this comprehensive rehabilitation program, you will be expected to participate in at least 3 hours of rehabilitation therapies Monday-Friday, with modified therapy programming on the weekends.  Your rehabilitation program will include the following services:  Physical Therapy (PT), Occupational Therapy (OT), 24 hour per day rehabilitation nursing, Case Management (Social Worker), Rehabilitation Medicine, Nutrition Services and Pharmacy Services  Weekly team conferences will be held on Tuesdays to discuss your progress.  Your Social Worker will talk with you frequently to get your input and to update you on team discussions.  Team conferences with you and your family in attendance may also be held.  Expected length of stay: 10-14 days   Overall anticipated outcome: moderate assistance  Depending on your progress and recovery, your program may change. Your Social Worker will coordinate services and will keep you informed of any changes. Your Social Worker's name and contact numbers are listed  below.  The following services may also be recommended but are not provided by the [facility] will be made to provide these services after discharge if needed.  Arrangements include referral to agencies that provide these services.  Your insurance has been verified to be:  [facility] Medicare Your primary doctor is:  [place]  Pertinent information will be shared with your doctor and your insurance company.  Social Worker:  [place], [place] or ([phone]    Information discussed with and copy given to patient by: [name], 12/11/2018, 2:59 PM

## 2018-12-11 NOTE — Progress Notes (Signed)
Dressing changed to left foot. Site is black, cold and painful. No new drainage observed. Has a +1 pulse on top of foot proximal to amputation site. Pain given for comfort throughout shift. [name], RN

## 2018-12-11 NOTE — Progress Notes (Signed)
[facility] PHYSICAL MEDICINE & REHABILITATION PROGRESS NOTE   Subjective/Complaints:  Patient reports doesn't know what's going on- but when quizzed him, knew where he was, why was here- sounds more like doesn't know plan until next therapy session.  Still having some pain in L TMA/foot part that's still here.  Per pt, his bowels are working.  Ate ~1/2 breakfast this AM. Wound VAC off L TMA.  ROS- denies SOB, CP, N/V/D/C; pain is controlled usually  Objective:   Dg Chest 2 View  Result Date: 12/09/2018 CLINICAL DATA:  Follow-up from previous exam EXAM: CHEST - 2 VIEW COMPARISON:  12/08/2018 FINDINGS: Cardiac shadow is enlarged but stable. Aortic calcifications are again seen. Bibasilar effusions and atelectasis/infiltrate are again noted left slightly greater than right stable from recent exam. No acute bony abnormality is noted. IMPRESSION: Stable bibasilar effusions and atelectasis/infiltrate. Electronically Signed   By: Inez Catalina M.D.   On: 12/09/2018 19:25   Recent Labs    12/09/18 0343 12/10/18 0504  WBC 12.7* 11.1*  HGB 9.3* 9.8*  HCT 27.7* 29.4*  PLT 221 265   Recent Labs    12/09/18 0343 12/10/18 0504  NA 130* 131*  K 3.8 4.6  CL 97* 94*  CO2 24 25  GLUCOSE 135* 132*  BUN 23 21  CREATININE 1.37* 1.48*  CALCIUM 8.7* 8.8*    Intake/Output Summary (Last 24 hours) at 12/11/2018 0916 Last data filed at 12/11/2018 0542 Gross per 24 hour  Intake 240 ml  Output 3000 ml  Net -2760 ml     Physical Exam: Vital Signs Blood pressure 130/63, pulse 64, temperature 98.2 F (36.8 C), resp. rate 18, height 5\' 11"  (1.803 m), weight 96 kg, SpO2 97 %.  Physical Exam  Gen:in pain, frail; sitting up in bed; finished breakfast again- ate ~1/2 breakfast; falling asleep watching TV/golf; NAD HEENT: oral mucosa pink and moist, NCAT Cardio: Regular rate- has holosystolic murmur;  Chest: normal effort, normal rate of breathing; CTA B/L- adequate air movement B/L Abd:  soft, non-distended; NT, ND, (+)BS Ext: no edema Skin: Right BKA well-healed.  Left transmetatarsal amputation site dressed with ACE wrap in place on L TMA.  Appropriately tender  Neuro: pt is vague and slow to process, but remembers examiner.  Musculoskeletal: 4/5 in upper extremities bilaterally. Patient is unable to participate in lower extremity testing  Psych: pleasant, normal affect; appears appropriate    Assessment/Plan: 1. Functional deficits secondary to L TMA in setting of R older BKA which require 3+ hours per day of interdisciplinary therapy in a comprehensive inpatient rehab setting.  Physiatrist is providing close team supervision and 24 hour management of active medical problems listed below.  Physiatrist and rehab team continue to assess barriers to discharge/monitor patient progress toward functional and medical goals  Care Tool:  Bathing              Bathing assist       Upper Body Dressing/Undressing Upper body dressing   What is the patient wearing?: Hospital gown only    Upper body assist Assist Level: Minimal Assistance - Patient > 75%    Lower Body Dressing/Undressing Lower body dressing      What is the patient wearing?: Hospital gown only     Lower body assist Assist for lower body dressing: Minimal Assistance - Patient > 75%     Toileting Toileting    Toileting assist Assist for toileting: Moderate Assistance - Patient 50 - 74%     Transfers  Chair/bed transfer  Transfers assist           Locomotion Ambulation   Ambulation assist              Walk 10 feet activity   Assist           Walk 50 feet activity   Assist           Walk 150 feet activity   Assist           Walk 10 feet on uneven surface  activity   Assist           Wheelchair     Assist               Wheelchair 50 feet with 2 turns activity    Assist            Wheelchair 150 feet activity      Assist          Blood pressure 130/63, pulse 64, temperature 98.2 F (36.8 C), resp. rate 18, height 5\' 11"  (1.803 m), weight 96 kg, SpO2 97 %.    Medical Problem List and Plan: 1.  Decreased functional mobility secondary to left transmetatarsal amputation 12/02/2018 with wound VAC applied as well as history of right BKA August 2020             -Will consult with surgical team to determine when patient is cleared to shower.   11/26- didn't see wound VAC that was mentioned; will d/w nursing- maybe under dressing?              -ELOS/Goals: MinA in PT and ADLs, independent in SLP             Sleeping well, pain controlled, moving bowels. 2.  Antithrombotics: -DVT/anticoagulation: Eliquis             -antiplatelet therapy: Plavix 75 mg daily 3. Pain Management: Ultram as needed.   11/26- pain meds work "sometimes" per pt- pt on Tylenol 650 mg q4 hours prn; tramadol 50 mg q6 hours prn; or oxycodone q4 hours prn. 4. Mood: Provide emotional support             -antipsychotic agents: N/A 5. Neuropsych: This patient is capable of making decisions on his own behalf. 6. Skin/Wound Care: Routine skin checks 7. Fluids/Electrolytes/Nutrition:  Monitor I/O. Check lytes in am.  8.  Acute blood loss anemia.  Follow-up CBC--history of heme positive stools.  Continue iron supplement 9.  PAF.  Cardiac rate controlled.  Continue Eliquis.  Follow-up cardiology services as needed 10.  Diastolic congestive heart failure.  Monitor weight daily--have been inconsistent. Will continue Lasix 20 mg daily--s/p IV lasix 20 mg 11/24. Was on 40 mg bid PTA due to recent decompensation.  Will recheck CXR and may need to resume due to progressive pleural effusions.   11/26- on Lasix 20 mg daily- will con't and monitor daily weights   Filed Weights   12/09/18 1656  Weight: 96 kg   11.  Hypertension.  Norvasc 10 mg daily.  Monitor with increased mobility 12.  Diabetes mellitus with peripheral neuropathy.   Latest hemoglobin A1c 7.0.  SSI.  Patient on glipizide hold. CBG (last 3)  Recent Labs    12/10/18 1642 12/10/18 2109 12/11/18 0629  GLUCAP 160* 185* 127*    11/27- BGs adequate control 13.  Hypothyroidism. On Synthroid for supplement 14.  BPH: Monitor voiding with PVR checks. Continue Proscar 5 mg daily, Flomax 0.4  mg twice daily.   15.  Hyperlipidemia.  Lipitor  16. Leucocytosis/?PNA: Monitor cough--on Ceftriaxone/Azithormycin D#2  11/26- on Rocephin for CAP 1 g q24 hours currently- WBC down to 11.1k from 12.7k.  11/27- will recheck labs in AM 17. CKD stage III: Will monitor renal status with serial checks.   11/26- Cr up slightly to 1.48  11/27- will recheck labs in AM 18. Hyponatremia  11/26- Na 131 up from 130.   LOS: 2 days A FACE TO FACE EVALUATION WAS PERFORMED  [name] 12/11/2018, 9:16 AM

## 2018-12-11 NOTE — Evaluation (Signed)
Physical Therapy Assessment and Plan  Patient Details  Name: [name] MRN: [id number] Date of Birth: [birth year]  PT Diagnosis: Cognitive deficits, Difficulty walking, Edema, Impaired sensation, Muscle weakness and Pain in L foot Rehab Potential: Fair ELOS: 10-12 days   Today's Date: 12/11/2018 PT Individual Time: 1040-1200 PT Individual Time Calculation (min): 80 min    Problem List:  Patient Active Problem List   Diagnosis Date Noted  . History of transmetatarsal amputation of left foot ([place]) 12/09/2018  . Critical limb ischemia with history of revascularization of same extremity   . Gangrene of left foot ([place])   . Diabetic ulcer of toe of left foot associated with type 2 diabetes mellitus ([place])   . Cellulitis of left lower extremity   . Toe infection 11/18/2018  . Peripheral edema   . Acute on chronic diastolic CHF (congestive heart failure) ([place]) 11/09/2018  . S/P BKA (below knee amputation) unilateral, right ([place]) 10/05/2018  . Hyponatremia   . Essential hypertension   . Postoperative pain   . Pain of left heel   . Unable to maintain weight-bearing   . Type 2 diabetes mellitus with diabetic peripheral angiopathy with gangrene ([place])   . Anemia due to acute blood loss   . Stage 3 chronic kidney disease   . Acquired absence of right leg below knee ([place]) 09/07/2018  . Gangrene of right foot ([place])   . DNR (do not resuscitate) discussion   . Goals of care, counseling/discussion   . Palliative care by specialist   . Type 2 diabetes mellitus with foot ulcer and gangrene ([place])   . Severe protein-calorie malnutrition ([place])   . Ischemic foot 08/20/2018  . Critical lower limb ischemia 08/03/2018  . Type 2 diabetes mellitus with foot ulcer, without long-term current use of insulin ([place])   . Gastroesophageal reflux disease   . Cellulitis of great toe of right foot 05/29/2018  . Atrial fibrillation, chronic   . Chest pain 07/25/2017  . PAF (paroxysmal atrial fibrillation)  ([place]) 07/25/2017  . BPH (benign prostatic hyperplasia) 07/25/2017  . Shortness of breath 05/11/2017  . Hypothyroidism 05/11/2017  . Chronic diastolic CHF (congestive heart failure) ([place]) 05/11/2017  . Moderate aortic stenosis 06/12/2016  . RBBB 06/12/2016  . Peripheral vascular disease ([place]) 08/04/2012  . Essential hypertension, benign 08/04/2012  . Hyperlipidemia 08/04/2012  . Dizziness 08/04/2012    Past Medical History:  Past Medical History:  Diagnosis Date  . Arthritis   . Borderline diabetic   . Diabetes mellitus without complication ([place])   . Glaucoma   . Hyperlipidemia   . Hypertension   . PVD (peripheral vascular disease) ([place])   . Sleep apnea    Cpap ordered but doesnt use   Past Surgical History:  Past Surgical History:  Procedure Laterality Date  . ABDOMINAL AORTOGRAM W/LOWER EXTREMITY N/A 07/27/2018   Procedure: ABDOMINAL AORTOGRAM W/LOWER EXTREMITY;  Surgeon: Lorretta Harp, MD;  Location: [facility];  Service: Cardiovascular;  Laterality: N/A;  . AMPUTATION Right 08/28/2018   Procedure: RIGHT BELOW KNEE AMPUTATION;  Surgeon: [name], MD;  Location: [facility];  Service: Orthopedics;  Laterality: Right;  . AMPUTATION Left 12/02/2018   Procedure: LEFT TRANSMETATARSAL AMPUTATION AND NEGATIVE PRESSURE WOUND VAC PLACEMENT;  Surgeon: [name], MD;  Location: [facility];  Service: Orthopedics;  Laterality: Left;  . APPENDECTOMY  1943  . CATARACT EXTRACTION  2012   x2  . COLONOSCOPY N/A 11/01/2014   Procedure: COLONOSCOPY;  Surgeon:  Aviva Signs, MD;  [facility];  Service: Gastroenterology;  Laterality: N/A;  . ESOPHAGOGASTRODUODENOSCOPY N/A 11/01/2014   Procedure: ESOPHAGOGASTRODUODENOSCOPY (EGD);  Surgeon: Aviva Signs, MD;  [facility];  Service: Gastroenterology;  Laterality: N/A;  . HERNIA REPAIR Left   . INGUINAL HERNIA REPAIR Right 03/01/2013   Procedure: RIGHT INGUINAL HERNIORRHAPHY;  Surgeon: Jamesetta So, MD;   Location: [facility];  Service: General;  Laterality: Right;  . INSERTION OF MESH Right 03/01/2013   Procedure: INSERTION OF MESH;  Surgeon: Jamesetta So, MD;  Location[facility];  Service: General;  Laterality: Right;  . LOWER EXTREMITY ANGIOGRAPHY Right 08/25/2018   Procedure: LOWER EXTREMITY ANGIOGRAPHY;  Surgeon: Wellington Hampshire, MD;  Location: [facility];  Service: Cardiovascular;  Laterality: Right;  . LOWER EXTREMITY ANGIOGRAPHY N/A 11/25/2018   Procedure: LOWER EXTREMITY ANGIOGRAPHY;  Surgeon: Wellington Hampshire, MD;  Location: [facility];  Service: Cardiovascular;  Laterality: N/A;  . NM MYOCAR PERF WALL MOTION  06/05/2010   no significant ischemia  . PERIPHERAL VASCULAR ATHERECTOMY Right 08/03/2018   Procedure: PERIPHERAL VASCULAR ATHERECTOMY;  Surgeon: Lorretta Harp, MD;  Location: [facility];  Service: Cardiovascular;  Laterality: Right;  . PERIPHERAL VASCULAR ATHERECTOMY Left 11/23/2018   Procedure: PERIPHERAL VASCULAR ATHERECTOMY;  Surgeon: Lorretta Harp, MD;  Location: [facility];  Service: Cardiovascular;  Laterality: Left;  sfa with dc balloon  . PERIPHERAL VASCULAR BALLOON ANGIOPLASTY Left 11/25/2018   Procedure: PERIPHERAL VASCULAR BALLOON ANGIOPLASTY;  Surgeon: Wellington Hampshire, MD;  Location: [facility];  Service: Cardiovascular;  Laterality: Left;  popliteal  . PERIPHERAL VASCULAR INTERVENTION Left 11/25/2018   Procedure: PERIPHERAL VASCULAR INTERVENTION;  Surgeon: Wellington Hampshire, MD;  Location: [facility];  Service: Cardiovascular;  Laterality: Left;  tibial peroneal trunk and peroneal stents   . stent  06/14/2010   left leg  . US ECHOCARDIOGRAPHY  01/21/2006   moderate mitral annular ca+, mild MR, AOV moderately sclerotic.    Assessment & Plan Clinical Impression: Patient is a 85 y.o. year old right-handed male well-known to rehab services with CIR admission 09/07/2018 until 09/22/2018 for right BKA secondary to  peripheral vascular disease--was discharged home requiring minimal assist for mobility as well as history of diabetes mellitus, recent admission for diastolic congestive heart failure, CKD stage III, PAF --on Eliquis (Dr. Debara Pickett), hypertension, hyperlipidemia.  Per chart review patient lives with family with assistance as needed 1 level home with ramped entrance.  Family was using a lift to help aid in transfers at home.  Presented 11/18/2018 with black discoloration of first toe left foot.  Patient had been followed at the wound care center.  Noted progressive redness of the dorsum of the left foot as well as skin ulcer 2 to 3 mm on the lateral aspect of the left foot.  X-rays revealed no acute osseous abnormality in the left foot.  Admission chemistries with sodium 132, BUN 31, creatinine 1.27, hemoglobin 11.5, WBC 10,800, lactic acid within normal limits 1.1, sedimentation rate elevated at 84 and BC negative.  He was started on IV vancomycin and Zosyn.    Dr. Sharol Given consulted for input and felt that foot not salvageable.  He received cardiac clearance and underwent left transmetatarsal amputation with placement of wound VAC on 12/02/2018 per Dr. Sharol Given. Postoperative Eliquis and Plavix resumed and low-dose aspirin was discontinued after discussing with cardiology services.  Acute blood loss anemia being monitored. He  did develop leucocytosis with cough and CXR 11/24 showed progressive CHF/PNA. He was treated with IV diuresis 11/24 and was started on broad spectrum antibiotics due to concerns of PNA. Diarrhea improving off laxatives. Of note, patient did have contact with sister 11/15 and she tested COVID positive on 11/18--> he was tested on 11/20 and is COVID negative.    To be NWB LLE.  Patient transferred to CIR on 12/09/2018 .   Patient currently requires total with mobility secondary to muscle weakness and muscle joint tightness, decreased cardiorespiratoy endurance, decreased initiation, decreased awareness,  decreased problem solving, decreased memory and delayed processing and decreased sitting balance, decreased standing balance, decreased postural control, decreased balance strategies and difficulty maintaining precautions.  Prior to hospitalization, patient was total with mobility and lived with Alone in a House home.  Home access is  Ramped entrance.  Patient will benefit from skilled PT intervention to maximize safe functional mobility, minimize fall risk and decrease caregiver burden for planned discharge home with 24 hour assist.  Anticipate patient will benefit from follow up [facility] at discharge.  PT - End of Session Activity Tolerance: Decreased this session Endurance Deficit: Yes Endurance Deficit Description: pt lethargic and falling asleep during session - suspect medication related PT Assessment Rehab Potential (ACUTE/IP ONLY): Fair PT Patient demonstrates impairments in the following area(s): Balance;Edema;Endurance;Motor;Pain;Perception;Safety;Sensory;Skin Integrity PT Transfers Functional Problem(s): Bed Mobility;Bed to Chair;Furniture PT Locomotion Functional Problem(s): Wheelchair Mobility PT Plan PT Intensity: Minimum of 1-2 x/day ,45 to 90 minutes PT Frequency: 5 out of 7 days PT Duration Estimated Length of Stay: 10-12 days PT Treatment/Interventions: Balance/vestibular training;Cognitive remediation/compensation;Discharge planning;Disease management/prevention;DME/adaptive equipment instruction;Functional mobility training;Neuromuscular re-education;Pain management;Patient/family education;Psychosocial support;Skin care/wound management;Splinting/orthotics;Therapeutic Activities;Therapeutic Exercise;UE/LE Strength taining/ROM;UE/LE Coordination activities;Wheelchair propulsion/positioning PT Transfers Anticipated Outcome(s): mod assist and/or hoyer PT Locomotion Anticipated Outcome(s): supervision w/c mobility PT Recommendation Follow Up Recommendations: Home health PT;24 hour  supervision/assistance Patient destination: Home Equipment Recommended: To be determined(hoyer?) Equipment Details: pt has w/c, slideboard, and a power assisted standing lift  Skilled Therapeutic Intervention Evaluation completed (see details above and below) with education on PT POC and goals and individual treatment initiated with focus on attempting to perform slideboard transfer (with stool under LLE to maintain NWB status through foot) but unable to initiate enough strength or maintain trunk control to successfully and safely perform, trunk control and core activation exercises due to posterior bias and tendency for LOB to the R, w/c propulsion for general strengthening and endurance, and education with pt and pt's son in regards to current level, goals, and plan. Son also reports that Dr. Sharol Given contacted [name] from [facility] that patient is here as his prosthetic was ready for fitting. Spoke with [name] and he saw patient today, reporting that he will need to recast patient due to pt with still smaller limb than when originally made. Goals may need to be adjusted pending how functional using the prosthesis is for the patient and how independent son will be with assisting for donning and doffing.   Son able to pull up type of powered lift they were using at home [facility] [phone] Lift (with upper body vest only)) but unsure if this would still work due to NWB status even with a different style of sling (if available). May benefit from use of hoyer lift for transfers upon d/c.   Transferred back to bed via maxi move due to unsafe transfer status at this time and pt's level of lethargy. Pt unable to maintain sitting balance without UE support without  max assist at times. Mod assist to return to supine in the bed. Left with son assisting with urinal use.     PT Evaluation Precautions/Restrictions Precautions Precautions: Fall Precaution Comments: preexisting R BKA; keep left Le  elevated Restrictions Weight Bearing Restrictions: Yes LLE Weight Bearing: Non weight bearing Pain C/o pain in L foot (unrated) - premedicated.  Home Living/Prior Functioning Home Living Living Arrangements: Alone(with adult children staying to provide 24/7 assist) Available Help at Discharge: Family;Available 24 hours/day Type of Home: House Home Access: Ramped entrance Home Layout: One level Bathroom Toilet: Standard Additional Comments: hoyer lift  Lives With: Alone Prior Function Level of Independence: Needs assistance with ADLs;Needs assistance with tranfers  Able to Take Stairs?: No Driving: No Vision/Perception  Perception Perception: Within Functional Limits Praxis Praxis: Intact  Cognition Overall Cognitive Status: History of cognitive impairments - at baseline Arousal/Alertness: Awake/alert Attention: Sustained Sustained Attention: Appears intact Memory: Impaired Immediate Memory Recall: Sock;Blue Memory Recall Sock: Not able to recall Memory Recall Blue: Without Cue Memory Recall Bed: Not able to recall Awareness: Impaired Awareness Impairment: Intellectual impairment Safety/Judgment: Appears intact Sensation Sensation Light Touch: Impaired Detail Light Touch Impaired Details: Impaired RLE;Impaired LLE Coordination Gross Motor Movements are Fluid and Coordinated: No Fine Motor Movements are Fluid and Coordinated: Yes Motor  Motor Motor: Abnormal postural alignment and control Motor - Skilled Clinical Observations: generalized weakness  Mobility Bed Mobility Bed Mobility: Supine to Sit Supine to Sit: Total Assistance - Patient < 25% Sit to Supine: Moderate Assistance - Patient 50-74% Transfers Transfers: Lateral/Scoot Transfers Sit to Stand: Other/comment Sit to Stand Comment: safety medical at this time Lateral/Scoot Transfers: Total Assistance - Patient < 25% Transfer (Assistive device): Other (Comment)(slide board) Transfer via Lift Equipment:  English as a second language teacher: Yes Wheelchair Assistance: Chartered loss adjuster: Both upper extremities Wheelchair Parts Management: Needs assistance Distance: 120'  Trunk/Postural Assessment  Cervical Assessment Cervical Assessment: (forward flexed) Thoracic Assessment Thoracic Assessment: (forward flexed) Lumbar Assessment Lumbar Assessment: (posterior pelvic tilt) Postural Control Postural Control: Deficits on evaluation Trunk Control: impaired - posterior bias and tendency for LOB to R Righting Reactions: delayed and inadequate  Balance Balance Balance Assessed: Yes Static Sitting Balance Static Sitting - Level of Assistance: 3: Mod assist;4: Min assist;2: Max assist Dynamic Sitting Balance Dynamic Sitting - Level of Assistance: 2: Max assist Extremity Assessment  RUE Assessment General Strength Comments: 3/5 LUE Assessment General Strength Comments: 3/5 RLE Assessment RLE Assessment: Exceptions to [facility] General Strength Comments: R BKA - grossly 3+/5 LLE Assessment LLE Assessment: Exceptions to [facility] General Strength Comments: grossly 3 to 3+/5 - edema and new transmet ampuation    Refer to Care Plan for Long Term Goals  Recommendations for other services: None   Discharge Criteria: Patient will be discharged from PT if patient refuses treatment 3 consecutive times without medical reason, if treatment goals not met, if there is a change in medical status, if patient makes no progress towards goals or if patient is discharged from hospital.  The above assessment, treatment plan, treatment alternatives and goals were discussed and mutually agreed upon: by patient and by family  Juanna Cao, PT, DPT, CBIS  12/11/2018, 3:37 PM

## 2018-12-11 NOTE — Evaluation (Signed)
Occupational Therapy Assessment and Plan  Patient Details  Name: [name] MRN: [id number] Date of Birth: 1927/06/30  OT Diagnosis: acute pain and muscle weakness (generalized) Rehab Potential: Rehab Potential (ACUTE ONLY): Good ELOS: ~10-12 days ? depends on family's goals for care   Today's Date: 12/11/2018 OT Individual Time: 0845-1000 OT Individual Time Calculation (min): 75 min     Problem List:  Patient Active Problem List   Diagnosis Date Noted  . History of transmetatarsal amputation of left foot ([place]) 12/09/2018  . Critical limb ischemia with history of revascularization of same extremity   . Gangrene of left foot ([place])   . Diabetic ulcer of toe of left foot associated with type 2 diabetes mellitus ([place])   . Cellulitis of left lower extremity   . Toe infection 11/18/2018  . Peripheral edema   . Acute on chronic diastolic CHF (congestive heart failure) ([place]) 11/09/2018  . S/P BKA (below knee amputation) unilateral, right ([place]) 10/05/2018  . Hyponatremia   . Essential hypertension   . Postoperative pain   . Pain of left heel   . Unable to maintain weight-bearing   . Type 2 diabetes mellitus with diabetic peripheral angiopathy with gangrene ([place])   . Anemia due to acute blood loss   . Stage 3 chronic kidney disease   . Acquired absence of right leg below knee ([place]) 09/07/2018  . Gangrene of right foot ([place])   . DNR (do not resuscitate) discussion   . Goals of care, counseling/discussion   . Palliative care by specialist   . Type 2 diabetes mellitus with foot ulcer and gangrene ([place])   . Severe protein-calorie malnutrition ([place])   . Ischemic foot 08/20/2018  . Critical lower limb ischemia 08/03/2018  . Type 2 diabetes mellitus with foot ulcer, without long-term current use of insulin ([place])   . Gastroesophageal reflux disease   . Cellulitis of great toe of right foot 05/29/2018  . Atrial fibrillation, chronic   . Chest pain 07/25/2017  . PAF (paroxysmal atrial  fibrillation) ([place]) 07/25/2017  . BPH (benign prostatic hyperplasia) 07/25/2017  . Shortness of breath 05/11/2017  . Hypothyroidism 05/11/2017  . Chronic diastolic CHF (congestive heart failure) ([place]) 05/11/2017  . Moderate aortic stenosis 06/12/2016  . RBBB 06/12/2016  . Peripheral vascular disease ([place]) 08/04/2012  . Essential hypertension, benign 08/04/2012  . Hyperlipidemia 08/04/2012  . Dizziness 08/04/2012    Past Medical History:  Past Medical History:  Diagnosis Date  . Arthritis   . Borderline diabetic   . Diabetes mellitus without complication ([place])   . Glaucoma   . Hyperlipidemia   . Hypertension   . PVD (peripheral vascular disease) ([place])   . Sleep apnea    Cpap ordered but doesnt use   Past Surgical History:  Past Surgical History:  Procedure Laterality Date  . ABDOMINAL AORTOGRAM W/LOWER EXTREMITY N/A 07/27/2018   Procedure: ABDOMINAL AORTOGRAM W/LOWER EXTREMITY;  Surgeon: Lorretta Harp, MD;  Location: [facility];  Service: Cardiovascular;  Laterality: N/A;  . AMPUTATION Right 08/28/2018   Procedure: RIGHT BELOW KNEE AMPUTATION;  Surgeon: [name], MD;  Location: [facility];  Service: Orthopedics;  Laterality: Right;  . AMPUTATION Left 12/02/2018   Procedure: LEFT TRANSMETATARSAL AMPUTATION AND NEGATIVE PRESSURE WOUND VAC PLACEMENT;  Surgeon: [name], MD;  Location: [facility];  Service: Orthopedics;  Laterality: Left;  . APPENDECTOMY  1943  . CATARACT EXTRACTION  2012   x2  . COLONOSCOPY N/A 11/01/2014  Procedure: COLONOSCOPY;  Surgeon: Aviva Signs, MD;  [facility];  Service: Gastroenterology;  Laterality: N/A;  . ESOPHAGOGASTRODUODENOSCOPY N/A 11/01/2014   Procedure: ESOPHAGOGASTRODUODENOSCOPY (EGD);  Surgeon: Aviva Signs, MD;  [facility];  Service: Gastroenterology;  Laterality: N/A;  . HERNIA REPAIR Left   . INGUINAL HERNIA REPAIR Right 03/01/2013   Procedure: RIGHT INGUINAL HERNIORRHAPHY;  Surgeon: Jamesetta So,  MD;  Location: [facility];  Service: General;  Laterality: Right;  . INSERTION OF MESH Right 03/01/2013   Procedure: INSERTION OF MESH;  Surgeon: Jamesetta So, MD;  Location: [facility];  Service: General;  Laterality: Right;  . LOWER EXTREMITY ANGIOGRAPHY Right 08/25/2018   Procedure: LOWER EXTREMITY ANGIOGRAPHY;  Surgeon: Wellington Hampshire, MD;  Location: [facility];  Service: Cardiovascular;  Laterality: Right;  . LOWER EXTREMITY ANGIOGRAPHY N/A 11/25/2018   Procedure: LOWER EXTREMITY ANGIOGRAPHY;  Surgeon: Wellington Hampshire, MD;  Location: [facility];  Service: Cardiovascular;  Laterality: N/A;  . NM MYOCAR PERF WALL MOTION  06/05/2010   no significant ischemia  . PERIPHERAL VASCULAR ATHERECTOMY Right 08/03/2018   Procedure: PERIPHERAL VASCULAR ATHERECTOMY;  Surgeon: Lorretta Harp, MD;  Location: [facility];  Service: Cardiovascular;  Laterality: Right;  . PERIPHERAL VASCULAR ATHERECTOMY Left 11/23/2018   Procedure: PERIPHERAL VASCULAR ATHERECTOMY;  Surgeon: Lorretta Harp, MD;  Location: [facility];  Service: Cardiovascular;  Laterality: Left;  sfa with dc balloon  . PERIPHERAL VASCULAR BALLOON ANGIOPLASTY Left 11/25/2018   Procedure: PERIPHERAL VASCULAR BALLOON ANGIOPLASTY;  Surgeon: Wellington Hampshire, MD;  Location: [facility];  Service: Cardiovascular;  Laterality: Left;  popliteal  . PERIPHERAL VASCULAR INTERVENTION Left 11/25/2018   Procedure: PERIPHERAL VASCULAR INTERVENTION;  Surgeon: Wellington Hampshire, MD;  Location: [facility];  Service: Cardiovascular;  Laterality: Left;  tibial peroneal trunk and peroneal stents   . stent  06/14/2010   left leg  . US ECHOCARDIOGRAPHY  01/21/2006   moderate mitral annular ca+, mild MR, AOV moderately sclerotic.    Assessment & Plan Clinical Impression: Patient is a 85 y.o. year old male right-handed male well-known to rehab services with CIR admission 09/07/2018 until 09/22/2018 for right BKA secondary to  peripheral vascular disease--was discharged home requiring minimal assist for mobility as well as history of diabetes mellitus, recent admission for diastolic congestive heart failure, CKD stage III, PAF --on Eliquis (Dr. Debara Pickett), hypertension, hyperlipidemia.  Per chart review patient lives with family with assistance as needed 1 level home with ramped entrance.  Family was using a lift to help aid in transfers at home.  Presented 11/18/2018 with black discoloration of first toe left foot.  Patient had been followed at the wound care center.  Noted progressive redness of the dorsum of the left foot as well as skin ulcer 2 to 3 mm on the lateral aspect of the left foot.  X-rays revealed no acute osseous abnormality in the left foot.  Admission chemistries with sodium 132, BUN 31, creatinine 1.27, hemoglobin 11.5, WBC 10,800, lactic acid within normal limits 1.1, sedimentation rate elevated at 84 and BC negative.  He was started on IV vancomycin and Zosyn.    Dr. Sharol Given consulted for input and felt that foot not salvageable.  He received cardiac clearance and underwent left transmetatarsal amputation with placement of wound VAC on 12/02/2018 per Dr. Sharol Given. Postoperative Eliquis and Plavix resumed and low-dose aspirin was discontinued after discussing with cardiology services.  Acute blood  loss anemia being monitored. He did develop leucocytosis with cough and CXR 11/24 showed progressive CHF/PNA. He was treated with IV diuresis 11/24 and was started on broad spectrum antibiotics due to concerns of PNA. Diarrhea improving off laxatives. Of note, patient did have contact with sister 11/15 and she tested COVID positive on 11/18--> he was tested on 11/20 and is COVID negative.    To be NWB LLE. Patient transferred to CIR on 12/09/2018 .    Patient currently requires max to total A with basic self-care skills and max A- total A for slide board transfers secondary to muscle weakness and acute pain, decreased  cardiorespiratoy endurance and decreased sitting balance, decreased postural control, decreased balance strategies and difficulty maintaining precautions.  Prior to hospitalization, patient could complete ADL with mod to max A.. Family had a mechanical standing lift that they used to transfer pt and A him up into standing. Pt is now NWB on left LE and will not be able to use the lift.   Patient will benefit from skilled intervention to decrease level of assist with basic self-care skills and increase independence with basic self-care skills prior to discharge home with care partner.  Anticipate patient will require moderate physical assestance and to max A - possibly with use of hoyer lift and follow up home health.  OT - End of Session Activity Tolerance: Tolerates 10 - 20 min activity with multiple rests OT Assessment Rehab Potential (ACUTE ONLY): Good OT Patient demonstrates impairments in the following area(s): Balance;Endurance;Motor;Pain;Skin Integrity OT Basic ADL's Functional Problem(s): Bathing;Dressing;Toileting OT Transfers Functional Problem(s): Toilet OT Additional Impairment(s): Fuctional Use of Upper Extremity OT Plan OT Intensity: Minimum of 1-2 x/day, 45 to 90 minutes OT Frequency: 5 out of 7 days OT Duration/Estimated Length of Stay: ~10-12 days ? depends on family's goals for care OT Treatment/Interventions: Balance/vestibular training;Discharge planning;Pain management;Self Care/advanced ADL retraining;Therapeutic Activities;UE/LE Coordination activities;Functional mobility training;Patient/family education;Skin care/wound managment;Therapeutic Exercise;DME/adaptive equipment instruction;Psychosocial support;Splinting/orthotics;UE/LE Strength taining/ROM;Wheelchair propulsion/positioning OT Self Feeding Anticipated Outcome(s): n/a OT Bathroom Transfers Anticipated Outcome(s): max A OT Recommendation Patient destination: Home Follow Up Recommendations: Home health  OT Equipment Details: hoyer lift   Skilled Therapeutic Intervention OT eval initiated with Ot goals, purpose and role discussed. Pt needed to use the bathroom when arrived. Assisted with total A to use the bed pan but with no results. Pt required max A to roll with bed rails. Pt with swollen penis and required extensive A to throughly clean.  Pt required max A to come up to EOB to participate in bathing EOB. Pt with decr sitting balance- following to the right and posteriorly.  Pt required A to correct. Min A for balance during bathing UB. Pt required max A to don UB clothing and total a for LB clothing. Pt performed slide board transfer from bed to w/c with total A with A to maintain balance and to scoot along surface. Pt able to perform grooming at sink with setup. Pt left sitting up in w/c with left foot propped up to help with edema and pain management.   OT Evaluation Precautions/Restrictions  Precautions Precautions: Fall Precaution Comments: preexisting R BKA; keep left Le elevated Restrictions Weight Bearing Restrictions: (P) Yes LLE Weight Bearing: Non weight bearing General Chart Reviewed: Yes Family/Caregiver Present: No    Pain  reports soreness in left LE and feels better with it elevated. Allowed for rest breaks as needed Home Living/Prior [place] expects to be discharged to:: Private residence Available Help at  Discharge: Family, Available 24 hours/day Type of Home: House Home Access: Ramped entrance Home Layout: One level Bathroom Toilet: Standard Additional Comments: hoyer lift  Lives With: Alone Prior Function Level of Independence: (P) Needs assistance with ADLs, Needs assistance with tranfers  Able to Take Stairs?: (P) No Driving: (P) No ADL ADL Eating: Supervision/safety Grooming: Supervision/safety Where Assessed-Grooming: Sitting at sink Upper Body Bathing: Moderate assistance Where Assessed-Upper Body Bathing: Edge of  bed Lower Body Bathing: Maximal assistance Where Assessed-Lower Body Bathing: Bed level Upper Body Dressing: Maximal assistance Where Assessed-Upper Body Dressing: Edge of bed Lower Body Dressing: Dependent Where Assessed-Lower Body Dressing: Edge of bed Toileting: Dependent Where Assessed-Toileting: Bed level Toilet Transfer: Not assessed Social research officer, government: Not assessed Vision Baseline Vision/History: Wears glasses Wears Glasses: At all times Patient Visual Report: No change from baseline Vision Assessment?: No apparent visual deficits Perception  Perception: Within Functional Limits Praxis Praxis: Intact Cognition Overall Cognitive Status: History of cognitive impairments - at baseline Arousal/Alertness: Awake/alert Orientation Level: Person;Place;Situation Person: Oriented Place: Oriented Situation: Oriented Year: (2011 and then reported 2002) Month: November Day of Week: (would not even guess one-) Memory: Impaired Immediate Memory Recall: Sock;Blue Memory Recall Sock: Not able to recall Memory Recall Blue: Without Cue Memory Recall Bed: Not able to recall Attention: Sustained Sustained Attention: Appears intact Awareness: Impaired Awareness Impairment: Intellectual impairment Safety/Judgment: Appears intact Sensation Sensation Light Touch: Impaired Detail Light Touch Impaired Details: Impaired RLE;Impaired LLE Coordination Gross Motor Movements are Fluid and Coordinated: No Fine Motor Movements are Fluid and Coordinated: Yes Motor  Motor Motor: (P) Abnormal postural alignment and control Motor - Skilled Clinical Observations: generalized weakness Mobility  Bed Mobility Bed Mobility: Supine to Sit Supine to Sit: Total Assistance - Patient < 25% Sit to Supine: (P) Moderate Assistance - Patient 50-74% Transfers Sit to Stand: Other/comment  Trunk/Postural Assessment  Cervical Assessment Cervical Assessment: (forward flexed) Thoracic  Assessment Thoracic Assessment: (forward flexed) Lumbar Assessment Lumbar Assessment: (posterior pelvic tilt) Postural Control Postural Control: Deficits on evaluation Trunk Control: poor- falls to the right and posterior - requires min A Righting Reactions: delayed and requires A to return to midline  Balance Dynamic Sitting Balance Sitting balance - Comments: able to reach forward from recliner to gather ADL items- min- mod A Extremity/Trunk Assessment RUE Assessment General Strength Comments: 3/5 LUE Assessment General Strength Comments: 3/5     Refer to Care Plan for Long Term Goals  Recommendations for other services: None    Discharge Criteria: Patient will be discharged from OT if patient refuses treatment 3 consecutive times without medical reason, if treatment goals not met, if there is a change in medical status, if patient makes no progress towards goals or if patient is discharged from hospital.  The above assessment, treatment plan, treatment alternatives and goals were discussed and mutually agreed upon: by patient  Nicoletta Ba 12/11/2018, 1:23 PM

## 2018-12-11 NOTE — Progress Notes (Signed)
Occupational Therapy Session Note  Patient Details  Name: [name] MRN: [id number] Date of Birth: [birth year]  Today's Date: 12/11/2018 OT Individual Time: 1300-1345 OT Individual Time Calculation (min): 45 min    Short Term Goals: Week 1:     Skilled Therapeutic Interventions/Progress Updates:  Pt resting in bed upon arrival with son present.  OT intervention with focus on bed mobility, sitting balance, and discharge planning to increase independence with BADLs. Pt required mod A for supine>sit EOB with HOB elevated and use of bed rails. Pt required CGA for sitting balance EOB. Per son, pt using a lift device similar to [facility] prior to admission.  Son acknowledges that lift was purchased primarily to reduce care giver burden. Discussion with son that current lift will probably not be possible upon discharge. Discussed with son that primary goal will be to determine and develop safest transfer method for use at home. Pt has access to w/c accessible van. Pt's son in agreement. Pt returned to supine and sat upright in bed to assist with doffing shirt.  Pt rolled in bed with min A to doff pants before donning hospital gown. Pt remained in bed with bed alarm activated and son present.   Therapy Documentation Precautions:  Precautions Precautions: Fall Precaution Comments: preexisting R BKA; keep left Le elevated Restrictions Weight Bearing Restrictions: (P) Yes LLE Weight Bearing: Non weight bearing General: General Chart Reviewed: Yes Family/Caregiver Present: No VPain: Pt c/o discomfort in R groin; RN notified and meds admin  Therapy/Group: Individual Therapy  [name] 12/11/2018, 2:50 PM

## 2018-12-11 NOTE — Progress Notes (Signed)
Social Work Assessment and Plan   Patient Details  Name: [name] MRN: [id number] Date of Birth: 1927/08/05  Today's Date: 12/11/2018  Problem List:  Patient Active Problem List   Diagnosis Date Noted  . History of transmetatarsal amputation of left foot ([place]) 12/09/2018  . Critical limb ischemia with history of revascularization of same extremity   . Gangrene of left foot ([place])   . Diabetic ulcer of toe of left foot associated with type 2 diabetes mellitus ([place])   . Cellulitis of left lower extremity   . Toe infection 11/18/2018  . Peripheral edema   . Acute on chronic diastolic CHF (congestive heart failure) ([place]) 11/09/2018  . S/P BKA (below knee amputation) unilateral, right ([place]) 10/05/2018  . Hyponatremia   . Essential hypertension   . Postoperative pain   . Pain of left heel   . Unable to maintain weight-bearing   . Type 2 diabetes mellitus with diabetic peripheral angiopathy with gangrene ([place])   . Anemia due to acute blood loss   . Stage 3 chronic kidney disease   . Acquired absence of right leg below knee ([place]) 09/07/2018  . Gangrene of right foot ([place])   . DNR (do not resuscitate) discussion   . Goals of care, counseling/discussion   . Palliative care by specialist   . Type 2 diabetes mellitus with foot ulcer and gangrene ([place])   . Severe protein-calorie malnutrition ([place])   . Ischemic foot 08/20/2018  . Critical lower limb ischemia 08/03/2018  . Type 2 diabetes mellitus with foot ulcer, without long-term current use of insulin ([place])   . Gastroesophageal reflux disease   . Cellulitis of great toe of right foot 05/29/2018  . Atrial fibrillation, chronic   . Chest pain 07/25/2017  . PAF (paroxysmal atrial fibrillation) ([place]) 07/25/2017  . BPH (benign prostatic hyperplasia) 07/25/2017  . Shortness of breath 05/11/2017  . Hypothyroidism 05/11/2017  . Chronic diastolic CHF (congestive heart failure) ([place]) 05/11/2017  . Moderate aortic stenosis 06/12/2016  .  RBBB 06/12/2016  . Peripheral vascular disease ([place]) 08/04/2012  . Essential hypertension, benign 08/04/2012  . Hyperlipidemia 08/04/2012  . Dizziness 08/04/2012   Past Medical History:  Past Medical History:  Diagnosis Date  . Arthritis   . Borderline diabetic   . Diabetes mellitus without complication ([place])   . Glaucoma   . Hyperlipidemia   . Hypertension   . PVD (peripheral vascular disease) ([place])   . Sleep apnea    Cpap ordered but doesnt use   Past Surgical History:  Past Surgical History:  Procedure Laterality Date  . ABDOMINAL AORTOGRAM W/LOWER EXTREMITY N/A 07/27/2018   Procedure: ABDOMINAL AORTOGRAM W/LOWER EXTREMITY;  Surgeon: Lorretta Harp, MD;  Location: [facility];  Service: Cardiovascular;  Laterality: N/A;  . AMPUTATION Right 08/28/2018   Procedure: RIGHT BELOW KNEE AMPUTATION;  Surgeon: [name], MD;  Location: [facility];  Service: Orthopedics;  Laterality: Right;  . AMPUTATION Left 12/02/2018   Procedure: LEFT TRANSMETATARSAL AMPUTATION AND NEGATIVE PRESSURE WOUND VAC PLACEMENT;  Surgeon: [name], MD;  Location: [facility];  Service: Orthopedics;  Laterality: Left;  . APPENDECTOMY  1943  . CATARACT EXTRACTION  2012   x2  . COLONOSCOPY N/A 11/01/2014   Procedure: COLONOSCOPY;  Surgeon: Aviva Signs, MD;  [facility];  Service: Gastroenterology;  Laterality: N/A;  . ESOPHAGOGASTRODUODENOSCOPY N/A 11/01/2014   Procedure: ESOPHAGOGASTRODUODENOSCOPY (EGD);  Surgeon: Aviva Signs, MD;  [facility];  Service: Gastroenterology;  Laterality: N/A;  . HERNIA REPAIR Left   . INGUINAL HERNIA REPAIR Right 03/01/2013   Procedure: RIGHT INGUINAL HERNIORRHAPHY;  Surgeon: Jamesetta So, MD;  Location: [facility];  Service: General;  Laterality: Right;  . INSERTION OF MESH Right 03/01/2013   Procedure: INSERTION OF MESH;  Surgeon: Jamesetta So, MD;  [facility];  Service: General;  Laterality: Right;  . LOWER EXTREMITY ANGIOGRAPHY Right  08/25/2018   Procedure: LOWER EXTREMITY ANGIOGRAPHY;  Surgeon: Wellington Hampshire, MD;  Location: [facility];  Service: Cardiovascular;  Laterality: Right;  . LOWER EXTREMITY ANGIOGRAPHY N/A 11/25/2018   Procedure: LOWER EXTREMITY ANGIOGRAPHY;  Surgeon: Wellington Hampshire, MD;  Location: [facility];  Service: Cardiovascular;  Laterality: N/A;  . NM MYOCAR PERF WALL MOTION  06/05/2010   no significant ischemia  . PERIPHERAL VASCULAR ATHERECTOMY Right 08/03/2018   Procedure: PERIPHERAL VASCULAR ATHERECTOMY;  Surgeon: Lorretta Harp, MD;  Location: [facility];  Service: Cardiovascular;  Laterality: Right;  . PERIPHERAL VASCULAR ATHERECTOMY Left 11/23/2018   Procedure: PERIPHERAL VASCULAR ATHERECTOMY;  Surgeon: Lorretta Harp, MD;  Location: [facility];  Service: Cardiovascular;  Laterality: Left;  sfa with dc balloon  . PERIPHERAL VASCULAR BALLOON ANGIOPLASTY Left 11/25/2018   Procedure: PERIPHERAL VASCULAR BALLOON ANGIOPLASTY;  Surgeon: Wellington Hampshire, MD;  Location: [facility];  Service: Cardiovascular;  Laterality: Left;  popliteal  . PERIPHERAL VASCULAR INTERVENTION Left 11/25/2018   Procedure: PERIPHERAL VASCULAR INTERVENTION;  Surgeon: Wellington Hampshire, MD;  Location: [facility];  Service: Cardiovascular;  Laterality: Left;  tibial peroneal trunk and peroneal stents   . stent  06/14/2010   left leg  . US ECHOCARDIOGRAPHY  01/21/2006   moderate mitral annular ca+, mild MR, AOV moderately sclerotic.   Social History:  reports that he quit smoking about 53 years ago. He has quit using smokeless tobacco.  His smokeless tobacco use included chew. He reports that he does not drink alcohol or use drugs.  Family / Support Systems Marital Status: Widow/Widower Patient Roles: Parent Children: son, Winson Eichorn [facility]) @ [phone];  daughter, [name] @ [phone];  son, Sherrick Araki @ [phone] Anticipated Caregiver: sons and  daughter Ability/Limitations of Caregiver: Daughter and her family recently with COVID 108, son [name]) does NOT have COVID Caregiver Availability: 24/7 Family Dynamics: All 3 adult children have been providing 24/7 care to patient for several months.  Son, [name], notes that they are working together with him providing most of the weekday coverage as his siblings are still working.  Social History Preferred language: English Religion: Protestant Cultural Background: NA Read: Yes Write: Yes Employment Status: Retired Public relations account executive Issues: None Guardian/Conservator: None - per MD, pt is capable of making decisions on his own behalf.   Abuse/Neglect Abuse/Neglect Assessment Can Be Completed: Yes Physical Abuse: Denies Verbal Abuse: Denies Sexual Abuse: Denies Exploitation of patient/patient's resources: Denies Self-Neglect: Denies  Emotional Status Pt's affect, behavior and adjustment status: Pt lying in bed, HOH and difficult to engage.  Son provides most of intake information. Pt does not appear in any emotional distress.  Son notes that he has c/o pain but "little else". Recent Psychosocial Issues: BKA in Sept 2020 Psychiatric History: None Substance Abuse History: None  Patient / Family Perceptions, Expectations & Goals Pt/Family understanding of illness & functional limitations: Pt and family with good understanding of medical issues which resulted in another amputation. Premorbid pt/family roles/activities: Adult children providing 24/7  assistance. Anticipated changes in roles/activities/participation: Little change anticipated - family committed to continuing providing assistance. Pt/family expectations/goals: "We want to get his transfers better."  [employer]: None Premorbid Home Care/DME Agencies: Other (Comment)([facility]) Transportation available at discharge: yes  Discharge Planning Living Arrangements: Alone(with  adult children staying to provide 24/7 assist) Support Systems: Children Type of Residence: Private residence Insurance Resources: [employer] Financial Resources: [place] Referred: No Living Expenses: Own Money Management: Family Does the patient have any problems obtaining your medications?: No Home Management: family Patient/Family Preliminary Plans: Pt to d/c home with family to resume 24/7 support. Social Work Anticipated Follow Up Needs: HH/OP Expected length of stay: ELOS 10 to 14 days  Clinical Impression Elderly gentleman who returns to CIR following additional amputation (here in Sept/Oct 2020 following BKA).  Son at bedside and provides most intake information as pt is [facility] and not very engaged in process.  Family fully intends to resume 24/7 assistance coverage but hopeful he may be better with transfers.  Will follow for support and d/c planning needs.  Lanecia Sliva 12/11/2018, 2:57 PM

## 2018-12-12 ENCOUNTER — Inpatient Hospital Stay (HOSPITAL_COMMUNITY): Payer: Medicare Other | Admitting: Physical Therapy

## 2018-12-12 ENCOUNTER — Inpatient Hospital Stay (HOSPITAL_COMMUNITY): Payer: Medicare Other | Admitting: Occupational Therapy

## 2018-12-12 ENCOUNTER — Inpatient Hospital Stay (HOSPITAL_COMMUNITY): Payer: Medicare Other

## 2018-12-12 LAB — CBC WITH DIFFERENTIAL/PLATELET
Abs Immature Granulocytes: 0.03 10*3/uL (ref 0.00–0.07)
Basophils Absolute: 0.1 10*3/uL (ref 0.0–0.1)
Basophils Relative: 1 %
Eosinophils Absolute: 0.5 10*3/uL (ref 0.0–0.5)
Eosinophils Relative: 5 %
HCT: 27.8 % — ABNORMAL LOW (ref 39.0–52.0)
Hemoglobin: 9.4 g/dL — ABNORMAL LOW (ref 13.0–17.0)
Immature Granulocytes: 0 %
Lymphocytes Relative: 21 %
Lymphs Abs: 2.2 10*3/uL (ref 0.7–4.0)
MCH: 30.4 pg (ref 26.0–34.0)
MCHC: 33.8 g/dL (ref 30.0–36.0)
MCV: 90 fL (ref 80.0–100.0)
Monocytes Absolute: 0.9 10*3/uL (ref 0.1–1.0)
Monocytes Relative: 9 %
Neutro Abs: 6.8 10*3/uL (ref 1.7–7.7)
Neutrophils Relative %: 64 %
Platelets: 249 10*3/uL (ref 150–400)
RBC: 3.09 MIL/uL — ABNORMAL LOW (ref 4.22–5.81)
RDW: 15.3 % (ref 11.5–15.5)
WBC: 10.6 10*3/uL — ABNORMAL HIGH (ref 4.0–10.5)
nRBC: 0 % (ref 0.0–0.2)

## 2018-12-12 LAB — BASIC METABOLIC PANEL
Anion gap: 12 (ref 5–15)
BUN: 27 mg/dL — ABNORMAL HIGH (ref 8–23)
CO2: 23 mmol/L (ref 22–32)
Calcium: 8.5 mg/dL — ABNORMAL LOW (ref 8.9–10.3)
Chloride: 94 mmol/L — ABNORMAL LOW (ref 98–111)
Creatinine, Ser: 1.41 mg/dL — ABNORMAL HIGH (ref 0.61–1.24)
GFR calc Af Amer: 51 mL/min — ABNORMAL LOW (ref >60)
GFR calc non Af Amer: 44 mL/min — ABNORMAL LOW (ref >60)
Glucose, Bld: 147 mg/dL — ABNORMAL HIGH (ref 70–99)
Potassium: 4.2 mmol/L (ref 3.5–5.1)
Sodium: 129 mmol/L — ABNORMAL LOW (ref 135–145)

## 2018-12-12 LAB — GLUCOSE, CAPILLARY
Glucose-Capillary: 145 mg/dL — ABNORMAL HIGH (ref 70–99)
Glucose-Capillary: 167 mg/dL — ABNORMAL HIGH (ref 70–99)
Glucose-Capillary: 151 mg/dL — ABNORMAL HIGH (ref 70–99)
Glucose-Capillary: 220 mg/dL — ABNORMAL HIGH (ref 70–99)

## 2018-12-12 MED ORDER — LIDOCAINE HCL (PF) 1 % IJ SOLN
INTRAMUSCULAR | Status: AC
  Filled 2018-12-12: qty 30

## 2018-12-12 MED ORDER — LIDOCAINE HCL URETHRAL/MUCOSAL 2 % EX GEL
1.0000 "application " | Freq: Every day | CUTANEOUS | Status: DC | PRN
  Administered 2018-12-15 (×2): 1 via TOPICAL
  Filled 2018-12-12 (×8): qty 5

## 2018-12-12 MED ORDER — LIDOCAINE HCL (CARDIAC) PF 100 MG/5ML IV SOSY
PREFILLED_SYRINGE | INTRAVENOUS | Status: AC
  Filled 2018-12-12: qty 5

## 2018-12-12 NOTE — Progress Notes (Signed)
Occupational Therapy Session Note  Patient Details  Name: [name] MRN: [id number] Date of Birth: May 07, 1927  Today's Date: 12/12/2018 OT Individual Time: 0902-0959 OT Individual Time Calculation (min): 57 min   Skilled Therapeutic Interventions/Progress Updates:    Pt greeted in bed with no c/o pain, finishing up his breakfast. Agreeable to engage in bathing/dressing tasks. Staff assisted pt with perihygiene and brief change beforehand. Brief was dry at start of session when checked. Mod A for supine<sit. Pt initially required Mod A for sitting balance when sponge bathing, fading to Min A as pt became more receptive to verbal cuing for posture. Focused on increasing anterior weight shift due to posterior bias. Manual cuing for correcting Lt lean at times also. He was able to apply deoderent and complete oral care with close supervision for sitting balance today. Also able to lean forward to wash Lt lower leg. Mod A for returning to bed after to proceed with LB dressing tasks. Pt at this time reported he needed to use the urinal. When provided Min A for urinal use and given increased time, pt was unable to void. Repositioned bed afterwards and pt then requested to use the bedpan and try the urinal once again. He was set up with both bedpan and urinal. Unable to void B+B after 7 minutes. He rolled Rt>Lt with Mod A for OT to apply brief. Pt wanted to keep his pants off while lying in bed. He then boosted himself up in bed with use of the headboard. Pt was repositioned for comfort and left with all needs within reach and bed alarm set. Tx focus placed on sitting balance, UB strengthening, and activity tolerance during functional tasks.    Therapy Documentation Precautions:  Precautions Precautions: Fall Precaution Comments: preexisting R BKA; keep left Le elevated Restrictions Weight Bearing Restrictions: Yes LLE Weight Bearing: Non weight bearing ADL: ADL Eating:  Supervision/safety Grooming: Supervision/safety Where Assessed-Grooming: Sitting at sink Upper Body Bathing: Moderate assistance Where Assessed-Upper Body Bathing: Edge of bed Lower Body Bathing: Maximal assistance Where Assessed-Lower Body Bathing: Bed level Upper Body Dressing: Maximal assistance Where Assessed-Upper Body Dressing: Edge of bed Lower Body Dressing: Dependent Where Assessed-Lower Body Dressing: Edge of bed Toileting: Dependent Where Assessed-Toileting: Bed level Toilet Transfer: Not assessed Gaffer Transfer: Not assessed     Therapy/Group: Individual Therapy  Sameen Leas A Caliph [place] 12/12/2018, 11:27 AM

## 2018-12-12 NOTE — IPOC Note (Signed)
Overall Plan of Care [facility]) Patient Details Name: [name] MRN: [id number] DOB: 07/01/1927  Admitting Diagnosis: History of transmetatarsal amputation of left foot [facility])  Hospital Problems: Principal Problem:   History of transmetatarsal amputation of left foot ([place]) Active Problems:   Peripheral vascular disease ([place])   PAF (paroxysmal atrial fibrillation) ([place])   Atrial fibrillation, chronic   Type 2 diabetes mellitus with foot ulcer, without long-term current use of insulin ([place])   Type 2 diabetes mellitus with foot ulcer and gangrene ([place])   S/P BKA (below knee amputation) unilateral, right ([place])     Functional Problem List: Nursing Bladder, Edema, Endurance, Pain, Safety, Skin Integrity  PT Balance, Edema, Endurance, Motor, Pain, Perception, Safety, Sensory, Skin Integrity  OT Balance, Endurance, Motor, Pain, Skin Integrity  SLP    TR         Basic ADL's: OT Bathing, Dressing, Toileting     Advanced  ADL's: OT       Transfers: PT Bed Mobility, Bed to Chair, [employer]  OT Toilet     Locomotion: PT Wheelchair Mobility     Additional Impairments: OT Fuctional Use of Upper Extremity  SLP        TR      Anticipated Outcomes Item Anticipated Outcome  Self Feeding n/a  Swallowing      Basic self-care     Toileting      Bathroom Transfers max A  Bowel/Bladder  maintain regular pattern of emptying bladder, remain continent  Transfers  mod assist and/or hoyer  Locomotion  supervision w/c mobility  Communication     Cognition     Pain  less than 4  Safety/Judgment  no falls, infection or skin breakdown while on rehab   Therapy Plan: PT Intensity: Minimum of 1-2 x/day ,45 to 90 minutes PT Frequency: 5 out of 7 days PT Duration Estimated Length of Stay: 10-12 days OT Intensity: Minimum of 1-2 x/day, 45 to 90 minutes OT Frequency: 5 out of 7 days OT Duration/Estimated Length of Stay: ~10-12 days ? depends on family's goals for care      Due to the current state of emergency, patients may not be receiving their 3-hours of Medicare-mandated therapy.   Team Interventions: Nursing Interventions Patient/Family Education, Bladder Management, Disease Management/Prevention, Pain Management, Skin Care/Wound Management, Psychosocial Support  PT interventions Balance/vestibular training, Cognitive remediation/compensation, Discharge planning, Disease management/prevention, DME/adaptive equipment instruction, Functional mobility training, Neuromuscular re-education, Pain management, Patient/family education, Psychosocial support, Skin care/wound management, Splinting/orthotics, Therapeutic Activities, Therapeutic Exercise, UE/LE Strength taining/ROM, UE/LE Coordination activities, Wheelchair propulsion/positioning  OT Interventions Balance/vestibular training, Discharge planning, Pain management, Self Care/advanced ADL retraining, Therapeutic Activities, UE/LE Coordination activities, Functional mobility training, Patient/family education, Skin care/wound managment, Therapeutic Exercise, DME/adaptive equipment instruction, Psychosocial support, Splinting/orthotics, UE/LE Strength taining/ROM, Wheelchair propulsion/positioning  SLP Interventions    TR Interventions    SW/CM Interventions Discharge Planning, Psychosocial Support, Patient/Family Education   Barriers to Discharge MD  Medical stability, IV antibiotics, Incontinence, Neurogenic bowel and bladder, Weight bearing restrictions and Behavior  Nursing      PT      OT      SLP      SW       Team Discharge Planning: Destination: PT-Home ,OT- Home , SLP-  Projected Follow-up: PT-Home health PT, 24 hour supervision/assistance, OT-  Home health OT, SLP-  Projected Equipment Needs: PT-To be determined(hoyer?), OT-  , SLP-  Equipment Details: PT-pt has w/c, slideboard, and a power assisted standing lift, OT-hoyer  lift Patient/family involved in discharge planning: PT- Patient,  Family member/caregiver,  OT-Patient, SLP-   MD ELOS: 10-12 days Medical Rehab Prognosis:  Fair Assessment: Pt is an 85 yr old male with  Recent R BKA and new L TMA 11/18- on Eliquis and Plavix; PAfib, diastolic CHF with CAP currently on Ceftriaxone/Azithromycin, CKD Stage III with BUN up to 27, hyponatremia with Na currently 129; and more urinary retention lately with hx of BPH on Flomax and Proscar- concerned about increasing Lasix due to increasing BUN/Cr; so will encourage fluid intake and recheck labs Monday; DM- BGs adequate control in mid 100s.  Goals supervision to mod assist based on function- 10-12 days estimated LOS.   See Team Conference Notes for weekly updates to the plan of care

## 2018-12-12 NOTE — Progress Notes (Signed)
Pt c/o increased urinary urgency. Pt voiding small volumes each time. Pt was bladder scanned at 0530, finding 311 mL. Provider will be notified for In/Out cath order PRN.

## 2018-12-12 NOTE — Plan of Care (Signed)
  Problem: Consults Goal: RH LIMB LOSS PATIENT EDUCATION Description: Description: See Patient Education module for eduction specifics. Outcome: Progressing   Problem: RH BLADDER ELIMINATION Goal: RH STG MANAGE BLADDER WITH ASSISTANCE Description: STG Manage Bladder With Assistance Min Outcome: Progressing   Problem: RH SKIN INTEGRITY Goal: RH STG SKIN FREE OF INFECTION/BREAKDOWN Description: Skin free of breakdown, infection Outcome: Progressing   Problem: RH PAIN MANAGEMENT Goal: RH STG PAIN MANAGED AT OR BELOW PT'S PAIN GOAL Description: Less than 4 Outcome: Progressing   Problem: RH KNOWLEDGE DEFICIT LIMB LOSS Goal: RH STG INCREASE KNOWLEDGE OF SELF CARE AFTER LIMB LOSS Description: Patient/family will be able to describe self care of limb including pain management, wound care and s/s of infection with cues/handouts Outcome: Progressing

## 2018-12-12 NOTE — Progress Notes (Signed)
Pt c/o constipation. Unsuccessful attempt at [facility] x2. Order will be obtained from provider for medication to aid with BM

## 2018-12-12 NOTE — Progress Notes (Signed)
Physical Therapy Session Note  Patient Details  Name: [name] MRN: [id number] Date of Birth: 07-Apr-1927  Today's Date: 12/12/2018 PT Individual Time: 1535-1620 PT Individual Time Calculation (min): 45 min   Short Term Goals: Week 1:  PT Short Term Goal 1 (Week 1): Pt will be able to demonstrate functional dynamic sitting balance with min assist PT Short Term Goal 2 (Week 1): Pt will be able to perform bed <> chair transfers with max assist  Skilled Therapeutic Interventions/Progress Updates:   Pt received supine in bed and agreeable to PT. Supine>sit transfer with max assist and max cues for use of bed features and NWB through the LLE. Sitting balance EOB with max assist initially to prevent posterior/L LOB. Pt noted to attempt to pull on bed frame to correct any LOB. Max multimodal cues for improved UE position to allow push down through UE to make corrections. PT initially resting LLE thigh on PT's knee to maintain NWB. PT then instructed pt in forward and cross body reaching task 2 x 10 for each direction BUE pt able to progress to mod assist to maintain balance. PT then elevated bed to ensure NWB. Lateral scoot to the L with max-total A and max multimodal cues to push through BUE and assist with scoot. Pt noted to continually try to pull on PT, regardless of cues given from PT.  Max-total A for sit>supine once pt completed lateral scooting. PT spoke with son about possible equipment needs for d/c with education for difference of sit<>stnad hoyer and traditional hoyer to ensure NWB through the LLE. Throughout treatment, pt noted to have increased lethargy, requiring stimulation to maintain arousal while sitting EOB. Pt left in bed with call bell in reach and all needs met at end of treatment. .       Therapy Documentation Precautions:  Precautions Precautions: Fall Precaution Comments: preexisting R BKA; keep left Le elevated Restrictions Weight Bearing Restrictions: Yes LLE  Weight Bearing: Non weight bearing General: PT Amount of Missed Time (min): 15 Minutes PT Missed Treatment Reason: Patient fatigue Vital Signs: Therapy Vitals Temp: 98.6 F (37 C) Pulse Rate: 65 Resp: 19 BP: 126/64 Patient Position (if appropriate): Lying Oxygen Therapy SpO2: 96 % O2 Device: Room Air Pain:   Face: none.    Therapy/Group: Individual Therapy  Lorie Phenix 12/12/2018, 4:29 PM

## 2018-12-12 NOTE — Progress Notes (Signed)
[place] PHYSICAL MEDICINE & REHABILITATION PROGRESS NOTE   Subjective/Complaints:  Patient reports  Pain OK after meds- still working on breakfast- thinks he's on break for therapy today but not sure.  ROS- denies SOB, CP, N/V/D/C; pain is controlled usually  Objective:   No results found. Recent Labs    12/10/18 0504 12/12/18 0539  WBC 11.1* 10.6*  HGB 9.8* 9.4*  HCT 29.4* 27.8*  PLT 265 249   Recent Labs    12/10/18 0504 12/12/18 0539  NA 131* 129*  K 4.6 4.2  CL 94* 94*  CO2 25 23  GLUCOSE 132* 147*  BUN 21 27*  CREATININE 1.48* 1.41*  CALCIUM 8.8* 8.5*    Intake/Output Summary (Last 24 hours) at 12/12/2018 1140 Last data filed at 12/12/2018 0853 Gross per 24 hour  Intake 553.6 ml  Output 650 ml  Net -96.4 ml     Physical Exam: Vital Signs Blood pressure (!) 145/66, pulse 68, temperature 98.2 F (36.8 C), resp. rate 18, height 5\' 11"  (1.803 m), weight 97.3 kg, SpO2 96 %.  Physical Exam  Gen:in pain, frail; sitting up in bed; - ate ~1/2 breakfast so far; still working on it;  NAD HEENT: oral mucosa pink and moist, NCAT Cardio: Regular rate- has holosystolic murmur;  Chest: normal effort, normal rate of breathing; CTA B/L- adequate air movement B/L Abd: soft, non-distended; NT, ND, (+)BS Ext: no edema Skin: Right BKA well-healed.  Left transmetatarsal amputation site dressed with ACE wrap in place on L TMA.  Appropriately tender  Neuro: pt is vague and slow to process, but remembers examiner.  Musculoskeletal: 4/5 in upper extremities bilaterally. Patient is unable to participate in lower extremity testing  Psych: pleasant, normal affect; appears appropriate    Assessment/Plan: 1. Functional deficits secondary to L TMA in setting of R older BKA which require 3+ hours per day of interdisciplinary therapy in a comprehensive inpatient rehab setting.  Physiatrist is providing close team supervision and 24 hour management of active medical problems  listed below.  Physiatrist and rehab team continue to assess barriers to discharge/monitor patient progress toward functional and medical goals  Care Tool:  Bathing    Body parts bathed by patient: Right arm, Left arm, Chest, Abdomen, Face, Left upper leg, Right upper leg   Body parts bathed by helper: Buttocks, Left lower leg Body parts n/a: Right lower leg, Front perineal area   Bathing assist Assist Level: Maximal Assistance - Patient 24 - 49%     Upper Body Dressing/Undressing Upper body dressing   What is the patient wearing?: Pull over shirt    Upper body assist Assist Level: Maximal Assistance - Patient 25 - 49%    Lower Body Dressing/Undressing Lower body dressing      What is the patient wearing?: Incontinence brief     Lower body assist Assist for lower body dressing: Total Assistance - Patient < 25%     Toileting Toileting    Toileting assist Assist for toileting: Total Assistance - Patient < 25%     Transfers Chair/bed transfer  Transfers assist  Chair/bed transfer activity did not occur: Safety/medical concerns  Chair/bed transfer assist level: Dependent - mechanical lift     Locomotion Ambulation   Ambulation assist   Ambulation activity did not occur: Safety/medical concerns(NWB LLE and R BKA)          Walk 10 feet activity   Assist  Walk 10 feet activity did not occur: Safety/medical concerns  Walk 50 feet activity   Assist Walk 50 feet with 2 turns activity did not occur: Safety/medical concerns         Walk 150 feet activity   Assist Walk 150 feet activity did not occur: Safety/medical concerns         Walk 10 feet on uneven surface  activity   Assist Walk 10 feet on uneven surfaces activity did not occur: Safety/medical concerns         Wheelchair     Assist Will patient use wheelchair at discharge?: Yes Type of Wheelchair: Manual    Wheelchair assist level: Supervision/Verbal cueing Max  wheelchair distance: 120'    Wheelchair 50 feet with 2 turns activity    Assist        Assist Level: Supervision/Verbal cueing   Wheelchair 150 feet activity     Assist  Wheelchair 150 feet activity did not occur: Safety/medical concerns(endurance)       Blood pressure (!) 145/66, pulse 68, temperature 98.2 F (36.8 C), resp. rate 18, height 5\' 11"  (1.803 m), weight 97.3 kg, SpO2 96 %.    Medical Problem List and Plan: 1.  Decreased functional mobility secondary to left transmetatarsal amputation 12/02/2018 with wound VAC applied as well as history of right BKA August 2020             -Will consult with surgical team to determine when patient is cleared to shower.   11/26- didn't see wound VAC that was mentioned; will d/w nursing- maybe under dressing?   11/28- wound VAC is off             -ELOS/Goals: MinA in PT and ADLs, independent in SLP             Sleeping well, pain controlled, moving bowels. 2.  Antithrombotics: -DVT/anticoagulation: Eliquis             -antiplatelet therapy: Plavix 75 mg daily 3. Pain Management: Ultram as needed.   11/26- pain meds work "sometimes" per pt- pt on Tylenol 650 mg q4 hours prn; tramadol 50 mg q6 hours prn; or oxycodone q4 hours prn. 4. Mood: Provide emotional support             -antipsychotic agents: N/A 5. Neuropsych: This patient is capable of making decisions on his own behalf. 6. Skin/Wound Care: Routine skin checks 7. Fluids/Electrolytes/Nutrition:  Monitor I/O. Check lytes in am.  8.  Acute blood loss anemia.  Follow-up CBC--history of heme positive stools.  Continue iron supplement 9.  PAF.  Cardiac rate controlled.  Continue Eliquis.  Follow-up cardiology services as needed 10.  Diastolic congestive heart failure.  Monitor weight daily--have been inconsistent. Will continue Lasix 20 mg daily--s/p IV lasix 20 mg 11/24. Was on 40 mg bid PTA due to recent decompensation.  Will recheck CXR and may need to resume due to  progressive pleural effusions.   11/26- on Lasix 20 mg daily- will con't and monitor daily weights  11/28- Weight wasn't checked daily, but increased 96 to 97.3 kg in last 3 days- BUN up to 27, so don't want to increase Lasix currently- labs Monday    Filed Weights   12/09/18 1656 12/12/18 0500  Weight: 96 kg 97.3 kg   11.  Hypertension.  Norvasc 10 mg daily.  Monitor with increased mobility 12.  Diabetes mellitus with peripheral neuropathy.  Latest hemoglobin A1c 7.0.  SSI.  Patient on glipizide hold. CBG (last 3)  Recent Labs    12/11/18 1641  12/11/18 2103 12/12/18 0559  GLUCAP 157* 181* 145*    11/28- BGs adequate control 13.  Hypothyroidism. On Synthroid for supplement 14.  BPH: Monitor voiding with PVR checks. Continue Proscar 5 mg daily, Flomax 0.4 mg twice daily.   15.  Hyperlipidemia.  Lipitor  16. Leucocytosis/?PNA: Monitor cough--on Ceftriaxone/Azithormycin D#2  11/26- on Rocephin for CAP 1 g q24 hours currently- WBC down to 11.1k from 12.7k.  11/27- will recheck labs in AM  11/28- WBC down to 10.6k- trending in right direction- if increases, recheck CXR 17. CKD stage III: Will monitor renal status with serial checks.   11/26- Cr up slightly to 1.48  11/27- will recheck labs in AM  11/28- Cr 1.41- slightly less, but BUN up to 27 from 21- will enocourage PO/fluid intake 18. Hyponatremia  11/26- Na 131 up from 130.  11/28- Na 129-will recheck Monday- is a little low, but basically stable.  19. Urinary retention with BPH  11/28- needed in/out cath last night for retention- on Proscar AND Flomax 0.4 mg BID - cannot increase- will check PVRs and cath if needed.    LOS: 3 days A FACE TO FACE EVALUATION WAS PERFORMED  [name] 12/12/2018, 11:40 AM

## 2018-12-12 NOTE — Progress Notes (Signed)
Occupational Therapy Session Note  Patient Details  Name: [name] MRN: [id number] Date of Birth: 11-10-1927  Today's Date: 12/12/2018 OT Individual Time: 1100-1155 OT Individual Time Calculation (min): 55 min    Short Term Goals: Week 1:     Skilled Therapeutic Interventions/Progress Updates:    Pt asleep in bed upon arrival with son present.  Pt easily aroused.  OT intervention with focus on bed mobility, sitting balance, activity tolerance, discharge planning, education with son, and safety awareness to increase independence with BADLs. Pt required max A for supine>sit EOB with HOB elevated. Sitting balance EOB initially close supervision but required min/mod A as session progressed. Pt engaged in [place] and trunk rotation tasks while seated EOB with extended rest breaks between each set. Pt required increasing assistance with sitting balance.  Increased posterior lean noted.  Pt assisted to supine with max A. Pt required assistance with repositioning in bed. Discussed transfer options with son.  The lift currently in use a home requires LE support with at least one LE. PT's son provided information on current lift and therapist will investigate options for current equipment.  Therapy will focus on slide board transfers. Pt remained in bed with all needs within reach, bed alarm activated, and son present.   Therapy Documentation Precautions:  Precautions Precautions: Fall Precaution Comments: preexisting R BKA; keep left Le elevated Restrictions Weight Bearing Restrictions: Yes LLE Weight Bearing: Non weight bearing  Pain: Pain Assessment Pain Scale: 0-10 Pain Score: 0-No pain  Therapy/Group: Individual Therapy  [name] 12/12/2018, 12:07 PM

## 2018-12-12 NOTE — Progress Notes (Signed)
Pt c/o of abd tenderness w/palpation. Pt was bladder scanned due to frequent voiding of small amounts. Pt scanned for 35 ml after voiding in the urinal. Pt was educated on the In/out cath procedure, to help empty the bladder. pt agreed to the procedure, but was in extreme pain during the insertion of the 14 fr cath. 550 ml urine was collected. Pt resting in bed at this time, call light in reach.     On call provider Dr. Dagoberto Ligas, notified for verbal order for Bladder scan PVR, In/ Out cath >350 ml, Coude Cath w/ lidocaine jelly.

## 2018-12-13 ENCOUNTER — Inpatient Hospital Stay (HOSPITAL_COMMUNITY): Payer: Medicare Other | Admitting: Physical Therapy

## 2018-12-13 ENCOUNTER — Inpatient Hospital Stay (HOSPITAL_COMMUNITY): Payer: Medicare Other

## 2018-12-13 LAB — GLUCOSE, CAPILLARY
Glucose-Capillary: 133 mg/dL — ABNORMAL HIGH (ref 70–99)
Glucose-Capillary: 177 mg/dL — ABNORMAL HIGH (ref 70–99)
Glucose-Capillary: 159 mg/dL — ABNORMAL HIGH (ref 70–99)
Glucose-Capillary: 210 mg/dL — ABNORMAL HIGH (ref 70–99)

## 2018-12-13 MED ORDER — FUROSEMIDE 10 MG/ML IJ SOLN
40.0000 mg | Freq: Once | INTRAMUSCULAR | Status: AC
  Administered 2018-12-13: 40 mg via INTRAVENOUS
  Filled 2018-12-13: qty 4

## 2018-12-13 NOTE — Progress Notes (Signed)
Patient presents with increased bilateral leg edema, son states that patients abdomen is swollen as well and is asking about increasing the dosage of lasix. Patient is also struggling to empty his bladder when urinating. Last output was 175 ml, PVR was 507 ml.

## 2018-12-13 NOTE — Progress Notes (Signed)
**Note Travis-Identified via Obfuscation** Physical Therapy Session Note  Patient Details  Name: [name] MRN: [id number] Date of Birth: 06/19/1927  Today's Date: 12/13/2018 PT Individual Time: 0815-0856 PT Individual Time Calculation (min): 41 min   Short Term Goals: Week 1:  PT Short Term Goal 1 (Week 1): Pt will be able to demonstrate functional dynamic sitting balance with min assist PT Short Term Goal 2 (Week 1): Pt will be able to perform bed <> chair transfers with max assist  Skilled Therapeutic Interventions/Progress Updates:   Pt asleep, needed increased time to wake up w/ lights on and frequent auditory stimulation to wake up and stay awake at first. Sat up in bed in chair position to eat breakfast w/ set-up assist. Missed 15 min of skilled PT 2/2 eating breakfast.  Donned shorts in supine w/ total assist to thread and pull over hips w/ R/L rolling. Max assist for R/L rolling and tactile/verbal cues to not put weight through LLE and for technique. Supine>sit w/ max assist. Slide board transfer to w/c w/ total assist using incontinence pad underneath buttocks to help w/ sliding across board. Pt LLE unable to reach ground, therefore did not put weight through LLE, but needed max tactile, verbal, and manual cues for anterior trunk lean, head/hips relationship, scooting, and for BUE placement. Pt unable to bend further than 90 deg at hips and once in chair, he immediately went back into posterior lean at trunk. Needed total assist to scoot hips back w/ tactile and verbal cues for UE placement, boosting through UEs and not LLE, and for anterior trunk lean. Pillow placed behind back to prevent further posterior lean. Quick release alarm belt also donned to prevent hips from sliding forward in chair. Pt self-propelled w/c 75' into hallway w/ supervision using BUEs, increased time 2/2 slow speed. Therapist adjusted pt's R amputee pad to increase friction as residual limb was sliding off. Also added L ELR leg rest to prevent  excessive weight through LLE while in chair. Returned to room total assist. Ended session in w/c, all needs in reach.   Therapy Documentation Precautions:  Precautions Precautions: Fall Precaution Comments: preexisting R BKA; keep left Le elevated Restrictions Weight Bearing Restrictions: Yes LLE Weight Bearing: Non weight bearing  Therapy/Group: Individual Therapy  Devanshi Califf Clent Demark 12/13/2018, 8:58 AM

## 2018-12-13 NOTE — Progress Notes (Signed)
[place] PHYSICAL MEDICINE & REHABILITATION PROGRESS NOTE   Subjective/Complaints:  Patient reports has therapy again at 9am- denies any issues.    ROS- denies SOB, CP, N/V/D/C; pain is controlled usually  Objective:   No results found. Recent Labs    12/12/18 0539  WBC 10.6*  HGB 9.4*  HCT 27.8*  PLT 249   Recent Labs    12/12/18 0539  NA 129*  K 4.2  CL 94*  CO2 23  GLUCOSE 147*  BUN 27*  CREATININE 1.41*  CALCIUM 8.5*    Intake/Output Summary (Last 24 hours) at 12/13/2018 1113 Last data filed at 12/13/2018 0900 Gross per 24 hour  Intake 580 ml  Output 1100 ml  Net -520 ml     Physical Exam: Vital Signs Blood pressure 138/61, pulse 64, temperature 98.3 F (36.8 C), resp. rate 19, height 5\' 11"  (1.803 m), weight 96.7 kg, SpO2 96 %.  Physical Exam  Gen:in pain, frail; sitting up in bed; - ate ~1/2 breakfast so far again today; still working on it; focused on food; not examiner; NAD HEENT: oral mucosa pink and moist, NCAT Cardio: Regular rate- has holosystolic murmur;  Chest: normal effort, normal rate of breathing; CTA B/L- adequate air movement B/L Abd: soft, non-distended; NT, ND, (+)BS Ext: no edema Skin: Right BKA well-healed.  Left transmetatarsal amputation site dressed with ACE wrap in place on L TMA.  Appropriately tender  Neuro: pt is vague and slow to process, but remembers examiner.  Musculoskeletal: 4/5 in upper extremities bilaterally. Patient is unable to participate in lower extremity testing  Psych: pleasant, normal affect; appears appropriate    Assessment/Plan: 1. Functional deficits secondary to L TMA in setting of R older BKA which require 3+ hours per day of interdisciplinary therapy in a comprehensive inpatient rehab setting.  Physiatrist is providing close team supervision and 24 hour management of active medical problems listed below.  Physiatrist and rehab team continue to assess barriers to discharge/monitor patient  progress toward functional and medical goals  Care Tool:  Bathing    Body parts bathed by patient: Right arm, Left arm, Chest, Abdomen, Face, Left upper leg, Right upper leg   Body parts bathed by helper: Buttocks, Left lower leg Body parts n/a: Right lower leg, Front perineal area   Bathing assist Assist Level: Maximal Assistance - Patient 24 - 49%     Upper Body Dressing/Undressing Upper body dressing   What is the patient wearing?: Pull over shirt    Upper body assist Assist Level: Maximal Assistance - Patient 25 - 49%    Lower Body Dressing/Undressing Lower body dressing      What is the patient wearing?: Incontinence brief     Lower body assist Assist for lower body dressing: Total Assistance - Patient < 25%     Toileting Toileting    Toileting assist Assist for toileting: Total Assistance - Patient < 25%     Transfers Chair/bed transfer  Transfers assist  Chair/bed transfer activity did not occur: Safety/medical concerns  Chair/bed transfer assist level: Total Assistance - Patient < 25%     Locomotion Ambulation   Ambulation assist   Ambulation activity did not occur: Safety/medical concerns(NWB LLE and R BKA)          Walk 10 feet activity   Assist  Walk 10 feet activity did not occur: Safety/medical concerns        Walk 50 feet activity   Assist Walk 50 feet with 2 turns activity  did not occur: Safety/medical concerns         Walk 150 feet activity   Assist Walk 150 feet activity did not occur: Safety/medical concerns         Walk 10 feet on uneven surface  activity   Assist Walk 10 feet on uneven surfaces activity did not occur: Safety/medical concerns         Wheelchair     Assist Will patient use wheelchair at discharge?: Yes Type of Wheelchair: Manual    Wheelchair assist level: Supervision/Verbal cueing Max wheelchair distance: 30'    Wheelchair 50 feet with 2 turns activity    Assist         Assist Level: Supervision/Verbal cueing   Wheelchair 150 feet activity     Assist  Wheelchair 150 feet activity did not occur: Safety/medical concerns(endurance)       Blood pressure 138/61, pulse 64, temperature 98.3 F (36.8 C), resp. rate 19, height 5\' 11"  (1.803 m), weight 96.7 kg, SpO2 96 %.    Medical Problem List and Plan: 1.  Decreased functional mobility secondary to left transmetatarsal amputation 12/02/2018 with wound VAC applied as well as history of right BKA August 2020             -Will consult with surgical team to determine when patient is cleared to shower.   11/26- didn't see wound VAC that was mentioned; will d/w nursing- maybe under dressing?   11/28- wound VAC is off             -ELOS/Goals: MinA in PT and ADLs, independent in SLP             Sleeping well, pain controlled, moving bowels. 2.  Antithrombotics: -DVT/anticoagulation: Eliquis             -antiplatelet therapy: Plavix 75 mg daily 3. Pain Management: Ultram as needed.   11/26- pain meds work "sometimes" per pt- pt on Tylenol 650 mg q4 hours prn; tramadol 50 mg q6 hours prn; or oxycodone q4 hours prn. 4. Mood: Provide emotional support             -antipsychotic agents: N/A 5. Neuropsych: This patient is capable of making decisions on his own behalf. 6. Skin/Wound Care: Routine skin checks 7. Fluids/Electrolytes/Nutrition:  Monitor I/O. Check lytes in am.  8.  Acute blood loss anemia.  Follow-up CBC--history of heme positive stools.  Continue iron supplement 9.  PAF.  Cardiac rate controlled.  Continue Eliquis.  Follow-up cardiology services as needed 10.  Diastolic congestive heart failure.  Monitor weight daily--have been inconsistent. Will continue Lasix 20 mg daily--s/p IV lasix 20 mg 11/24. Was on 40 mg bid PTA due to recent decompensation.  Will recheck CXR and may need to resume due to progressive pleural effusions.   11/26- on Lasix 20 mg daily- will con't and monitor daily  weights  11/28- Weight wasn't checked daily, but increased 96 to 97.3 kg in last 3 days- BUN up to 27, so don't want to increase Lasix currently- labs Monday   11/29- weight back down to 96.7kg   Filed Weights   12/09/18 1656 12/12/18 0500 12/13/18 0500  Weight: 96 kg 97.3 kg 96.7 kg   11.  Hypertension.  Norvasc 10 mg daily.  Monitor with increased mobility 12.  Diabetes mellitus with peripheral neuropathy.  Latest hemoglobin A1c 7.0.  SSI.  Patient on glipizide hold. CBG (last 3)  Recent Labs    12/12/18 1634 12/12/18 2110 12/13/18 8756  GLUCAP 151* 220* 133*    11/29- BGs adequate control- 1 high BG 13.  Hypothyroidism. On Synthroid for supplement 14.  BPH: Monitor voiding with PVR checks. Continue Proscar 5 mg daily, Flomax 0.4 mg twice daily.   15.  Hyperlipidemia.  Lipitor  16. Leucocytosis/?PNA: Monitor cough--on Ceftriaxone/Azithormycin D#2  11/26- on Rocephin for CAP 1 g q24 hours currently- WBC down to 11.1k from 12.7k.  11/27- will recheck labs in AM  11/28- WBC down to 10.6k- trending in right direction- if increases, recheck CXR 17. CKD stage III: Will monitor renal status with serial checks.   11/26- Cr up slightly to 1.48  11/27- will recheck labs in AM  11/28- Cr 1.41- slightly less, but BUN up to 27 from 21- will enocourage PO/fluid intake 18. Hyponatremia  11/26- Na 131 up from 130.  11/28- Na 129-will recheck Monday- is a little low, but basically stable.  19. Urinary retention with BPH  11/28- needed in/out cath last night for retention- on Proscar AND Flomax 0.4 mg BID - cannot increase- will check PVRs and cath if needed.    LOS: 4 days A FACE TO FACE EVALUATION WAS PERFORMED  Edina Winningham 12/13/2018, 11:13 AM

## 2018-12-13 NOTE — Plan of Care (Signed)
  Problem: Consults Goal: RH LIMB LOSS PATIENT EDUCATION Description: Description: See Patient Education module for eduction specifics. Outcome: Progressing   Problem: RH BLADDER ELIMINATION Goal: RH STG MANAGE BLADDER WITH ASSISTANCE Description: STG Manage Bladder With Assistance Min Outcome: Progressing   Problem: RH SKIN INTEGRITY Goal: RH STG SKIN FREE OF INFECTION/BREAKDOWN Description: Skin free of breakdown, infection Outcome: Progressing   Problem: RH PAIN MANAGEMENT Goal: RH STG PAIN MANAGED AT OR BELOW PT'S PAIN GOAL Description: Less than 4 Outcome: Progressing   Problem: RH KNOWLEDGE DEFICIT LIMB LOSS Goal: RH STG INCREASE KNOWLEDGE OF SELF CARE AFTER LIMB LOSS Description: Patient/family will be able to describe self care of limb including pain management, wound care and s/s of infection with cues/handouts Outcome: Progressing

## 2018-12-13 NOTE — Progress Notes (Signed)
Occupational Therapy Session Note  Patient Details  Name: [name] MRN: [id number] Date of Birth: 01-07-1928  Today's Date: 12/13/2018 OT Individual Time: 1000-1055 OT Individual Time Calculation (min): 55 min    Short Term Goals: Week 1:     Skilled Therapeutic Interventions/Progress Updates:    Pt asleep in w/c upon arrival but easily aroused.  Initial focus on w/c mobility with max A. Pt engaged in [place] therex in gym with 1kg ball and 3# bar- chest presses 3x10 and rowing 3x10.  Pt with increased RLE edema and returned to room.  Slide board transfer with tot A+2 to bed.  Max A for sit>supine in bed and repositioning in bed. Pt remained in bed with RN attending. Bed alarm activated.   Therapy Documentation Precautions:  Precautions Precautions: Fall Precaution Comments: preexisting R BKA; keep left Le elevated Restrictions Weight Bearing Restrictions: Yes LLE Weight Bearing: Non weight bearing   Pain:  Pt denies pain this morning   Therapy/Group: Individual Therapy  [name] 12/13/2018, 12:40 PM

## 2018-12-13 NOTE — Progress Notes (Signed)
Occupational Therapy Session Note  Patient Details  Name: [name] MRN: [id number] Date of Birth: 16-Jan-1927  Today's Date: 12/13/2018 OT Individual Time: 1435-1520 OT Individual Time Calculation (min): 45 min    Short Term Goals: Week 1:   STG= LTG d/t ELOS  Skilled Therapeutic Interventions/Progress Updates:    Pt received supine sleeping with son present. Pt easily aroused and agreeable to bed level ADLs 2/2 fatigue. Pt reports L foot is "sore", not rated and no request for intervention. Pt completed LB bathing with proper positioning in bed to ensure he could reach and then min A to wash distal LLE. Pt able to wash buttocks when rolling R, with min A as well. Pt required max A to transition to EOB from L sidelying. Once EOB, pt able to maintain EOB sitting balance with RUE support on bed rail. Pt doffed shirt with min A to maintain balance and pull overhead. Pt completed UB bathing with CGA. Pt donned new shirt with min A. Pt attempted to scoot up in bed from EOB but was unable to clear any part of his bottom. Pt transitioned back to supine with assistance of OT and his son. Pt was assisted in being boosted up in bed. HOB elevated and pt completed oral hygiene with set up assist. Pt left supine with all needs met, bed alarm set. Son present.   Therapy Documentation Precautions:  Precautions Precautions: Fall Precaution Comments: preexisting R BKA; keep left Le elevated Restrictions Weight Bearing Restrictions: Yes LLE Weight Bearing: Non weight bearing   Therapy/Group: Individual Therapy  Curtis Sites 12/13/2018, 7:33 AM

## 2018-12-14 ENCOUNTER — Inpatient Hospital Stay (HOSPITAL_COMMUNITY): Payer: Medicare Other

## 2018-12-14 ENCOUNTER — Ambulatory Visit: Payer: Medicare Other | Admitting: Internal Medicine

## 2018-12-14 LAB — GLUCOSE, CAPILLARY
Glucose-Capillary: 151 mg/dL — ABNORMAL HIGH (ref 70–99)
Glucose-Capillary: 150 mg/dL — ABNORMAL HIGH (ref 70–99)
Glucose-Capillary: 146 mg/dL — ABNORMAL HIGH (ref 70–99)
Glucose-Capillary: 128 mg/dL — ABNORMAL HIGH (ref 70–99)

## 2018-12-14 LAB — CBC
HCT: 28.5 % — ABNORMAL LOW (ref 39.0–52.0)
Hemoglobin: 9.5 g/dL — ABNORMAL LOW (ref 13.0–17.0)
MCH: 30.2 pg (ref 26.0–34.0)
MCHC: 33.3 g/dL (ref 30.0–36.0)
MCV: 90.5 fL (ref 80.0–100.0)
Platelets: 304 10*3/uL (ref 150–400)
RBC: 3.15 MIL/uL — ABNORMAL LOW (ref 4.22–5.81)
RDW: 15.4 % (ref 11.5–15.5)
WBC: 9.5 10*3/uL (ref 4.0–10.5)
nRBC: 0 % (ref 0.0–0.2)

## 2018-12-14 LAB — BASIC METABOLIC PANEL
Anion gap: 10 (ref 5–15)
BUN: 27 mg/dL — ABNORMAL HIGH (ref 8–23)
CO2: 28 mmol/L (ref 22–32)
Calcium: 8.8 mg/dL — ABNORMAL LOW (ref 8.9–10.3)
Chloride: 94 mmol/L — ABNORMAL LOW (ref 98–111)
Creatinine, Ser: 1.41 mg/dL — ABNORMAL HIGH (ref 0.61–1.24)
GFR calc Af Amer: 51 mL/min — ABNORMAL LOW (ref >60)
GFR calc non Af Amer: 44 mL/min — ABNORMAL LOW (ref >60)
Glucose, Bld: 145 mg/dL — ABNORMAL HIGH (ref 70–99)
Potassium: 3.6 mmol/L (ref 3.5–5.1)
Sodium: 132 mmol/L — ABNORMAL LOW (ref 135–145)

## 2018-12-14 NOTE — Progress Notes (Signed)
Occupational Therapy Session Note  Patient Details  Name: [name] MRN: [id number] Date of Birth: 13-Oct-1927  Today's Date: 12/14/2018 OT Individual Time: 1100-1200 OT Individual Time Calculation (min): 60 min    Short Term Goals: Week 1:     Skilled Therapeutic Interventions/Progress Updates:    Pt resting in w/c upon arrival.  Pt transitioned to gym and engaged in trunk flexion, trunk rotation, and sitting balance tasks to increase pt's ability to assist with slide board transfers and decrease caregiver burden. Pt completed partial crunches in w/c with 1kg ball. Pt completed trunk rotation activities with 1kg ball seated in w/c. Pt also completed chest presses and overhead presses with 3# bar 3x10. Upon return, discussed goals with son and options for transfers at home. Pt currently has [employer] Get[employer] hydraulic stand up lift which can aslo be used a modified hoyer lift with proper sling.  Discussed with son that pt will be unable to assist with tranfsers in lift.  Currently slide board transfers are +2 and family had started using lift when transfers were only min A. Pt's son to locate appropriate sling for use with home lift.  Pt remained in w/c with belt alarm activated and son present.   Therapy Documentation Precautions:  Precautions Precautions: Fall Precaution Comments: preexisting R BKA; keep left Le elevated Restrictions Weight Bearing Restrictions: Yes LLE Weight Bearing: Non weight bearing  Pain: Pain Assessment Pain Scale: 0-10 Pain Score: 4  Pain Type: Acute pain Pain Location: Foot Pain Orientation: Left Pain Descriptors / Indicators: Aching;Tender;Sore Pain Onset: On-going Patients Stated Pain Goal: 3 Pain Intervention(s): (tylenol given prior to therapy)   Therapy/Group: Individual Therapy  [name] 12/14/2018, 12:24 PM

## 2018-12-14 NOTE — Progress Notes (Signed)
[facility] PHYSICAL MEDICINE & REHABILITATION PROGRESS NOTE   Subjective/Complaints:  Has no complaints this morning. Has little bit of pain at stump site and some phantom limb sensation but this is not bothersome to him. Hgb stable, Na improved.    ROS- denies SOB, CP, N/V/D/C; pain is controlled usually  Objective:   No results found. Recent Labs    12/12/18 0539 12/14/18 0552  WBC 10.6* 9.5  HGB 9.4* 9.5*  HCT 27.8* 28.5*  PLT 249 304   Recent Labs    12/12/18 0539 12/14/18 0552  NA 129* 132*  K 4.2 3.6  CL 94* 94*  CO2 23 28  GLUCOSE 147* 145*  BUN 27* 27*  CREATININE 1.41* 1.41*  CALCIUM 8.5* 8.8*    Intake/Output Summary (Last 24 hours) at 12/14/2018 0912 Last data filed at 12/14/2018 0757 Gross per 24 hour  Intake 600 ml  Output 3359 ml  Net -2759 ml     Physical Exam: Vital Signs Blood pressure (!) 131/58, pulse (!) 57, temperature 98.3 F (36.8 C), temperature source Oral, resp. rate 16, height 5\' 11"  (1.803 m), weight 94.5 kg, SpO2 94 %.  Physical Exam  Gen:in pain, NAD, watching TV, appears comfortable.  HEENT: oral mucosa pink and moist, NCAT Cardio: Regular rate- has holosystolic murmur;  Chest: normal effort, normal rate of breathing; CTA B/L- adequate air movement B/L Abd: soft, non-distended; NT, ND, (+)BS Ext: no edema Skin: Right BKA well-healed.  Left transmetatarsal amputation site dressed with ACE wrap in place on L TMA.  Appropriately tender. Neuro: pt is vague and slow to process, but remembers examiner.  Musculoskeletal: 4/5 in upper extremities bilaterally. Patient is unable to participate in lower extremity testing  Psych: pleasant, normal affect; appears appropriate    Assessment/Plan: 1. Functional deficits secondary to L TMA in setting of R older BKA which require 3+ hours per day of interdisciplinary therapy in a comprehensive inpatient rehab setting.  Physiatrist is providing close team supervision and 24 hour  management of active medical problems listed below.  Physiatrist and rehab team continue to assess barriers to discharge/monitor patient progress toward functional and medical goals  Care Tool:  Bathing    Body parts bathed by patient: Right arm, Left arm, Chest, Abdomen, Face, Left upper leg, Right upper leg   Body parts bathed by helper: Buttocks, Left lower leg Body parts n/a: Right lower leg, Front perineal area   Bathing assist Assist Level: Maximal Assistance - Patient 24 - 49%     Upper Body Dressing/Undressing Upper body dressing   What is the patient wearing?: Pull over shirt    Upper body assist Assist Level: Maximal Assistance - Patient 25 - 49%    Lower Body Dressing/Undressing Lower body dressing      What is the patient wearing?: Incontinence brief     Lower body assist Assist for lower body dressing: Total Assistance - Patient < 25%     Toileting Toileting    Toileting assist Assist for toileting: Total Assistance - Patient < 25%     Transfers Chair/bed transfer  Transfers assist  Chair/bed transfer activity did not occur: Safety/medical concerns  Chair/bed transfer assist level: Total Assistance - Patient < 25%     Locomotion Ambulation   Ambulation assist   Ambulation activity did not occur: Safety/medical concerns(NWB LLE and R BKA)          Walk 10 feet activity   Assist  Walk 10 feet activity did not occur: Safety/medical  concerns        Walk 50 feet activity   Assist Walk 50 feet with 2 turns activity did not occur: Safety/medical concerns         Walk 150 feet activity   Assist Walk 150 feet activity did not occur: Safety/medical concerns         Walk 10 feet on uneven surface  activity   Assist Walk 10 feet on uneven surfaces activity did not occur: Safety/medical concerns         Wheelchair     Assist Will patient use wheelchair at discharge?: Yes Type of Wheelchair: Manual    Wheelchair  assist level: Supervision/Verbal cueing Max wheelchair distance: 4'    Wheelchair 50 feet with 2 turns activity    Assist        Assist Level: Supervision/Verbal cueing   Wheelchair 150 feet activity     Assist  Wheelchair 150 feet activity did not occur: Safety/medical concerns(endurance)       Blood pressure (!) 131/58, pulse (!) 57, temperature 98.3 F (36.8 C), temperature source Oral, resp. rate 16, height 5\' 11"  (1.803 m), weight 94.5 kg, SpO2 94 %.    Medical Problem List and Plan: 1.  Decreased functional mobility secondary to left transmetatarsal amputation 12/02/2018 with wound VAC applied as well as history of right BKA August 2020             -Will consult with surgical team to determine when patient is cleared to shower.   11/26- didn't see wound VAC that was mentioned; will d/w nursing- maybe under dressing?   11/28- wound VAC is off             -ELOS/Goals: MinA in PT and ADLs, independent in SLP             Sleeping well, pain controlled, moving bowels. 2.  Antithrombotics: -DVT/anticoagulation: Eliquis             -antiplatelet therapy: Plavix 75 mg daily 3. Pain Management: Ultram as needed.   11/26- pain meds work "sometimes" per pt- pt on Tylenol 650 mg q4 hours prn; tramadol 50 mg q6 hours prn; or oxycodone q4 hours prn. 4. Mood: Provide emotional support             -antipsychotic agents: N/A 5. Neuropsych: This patient is capable of making decisions on his own behalf. 6. Skin/Wound Care: Routine skin checks 7. Fluids/Electrolytes/Nutrition:  Monitor I/O. Check lytes in am.  8.  Acute blood loss anemia.  Follow-up CBC--history of heme positive stools.  Continue iron supplement 9.  PAF.  Cardiac rate controlled.  Continue Eliquis.  Follow-up cardiology services as needed 10.  Diastolic congestive heart failure.  Monitor weight daily--have been inconsistent. Will continue Lasix 20 mg daily--s/p IV lasix 20 mg 11/24. Was on 40 mg bid PTA due to  recent decompensation.  Will recheck CXR and may need to resume due to progressive pleural effusions.   11/26- on Lasix 20 mg daily- will con't and monitor daily weights  11/28- Weight wasn't checked daily, but increased 96 to 97.3 kg in last 3 days- BUN up to 27, so don't want to increase Lasix currently- labs Monday   11/29- weight back down to 96.7kg  11/30: weight decreased to 94.5 kg.   Filed Weights   12/12/18 0500 12/13/18 0500 12/14/18 0537  Weight: 97.3 kg 96.7 kg 94.5 kg   11.  Hypertension.  Norvasc 10 mg daily.  Monitor with increased mobility  12.  Diabetes mellitus with peripheral neuropathy.  Latest hemoglobin A1c 7.0.  SSI.  Patient on glipizide hold. CBG (last 3)  Recent Labs    12/13/18 1640 12/13/18 2057 12/14/18 0628  GLUCAP 159* 210* 151*    11/29- BGs adequate control- 1 high BG  11/30: BG well controlled.  13.  Hypothyroidism. On Synthroid for supplement 14.  BPH: Monitor voiding with PVR checks. Continue Proscar 5 mg daily, Flomax 0.4 mg twice daily.   15.  Hyperlipidemia.  Lipitor  16. Leucocytosis/?PNA: Monitor cough--on Ceftriaxone/Azithormycin D#2  11/26- on Rocephin for CAP 1 g q24 hours currently- WBC down to 11.1k from 12.7k.  11/27- will recheck labs in AM  11/28- WBC down to 10.6k- trending in right direction- if increases, recheck CXR  11/30: Resolved 17. CKD stage III: Will monitor renal status with serial checks.   11/26- Cr up slightly to 1.48  11/27- will recheck labs in AM  11/28- Cr 1.41- slightly less, but BUN up to 27 from 21- will enocourage PO/fluid intake 18. Hyponatremia  11/26- Na 131 up from 130.  11/28- Na 129-will recheck Monday- is a little low, but basically stable.   11/30: Na Improved to 132 19. Urinary retention with BPH  11/28- needed in/out cath last night for retention- on Proscar AND Flomax 0.4 mg BID - cannot increase- will check PVRs and cath if needed.    LOS: 5 days A FACE TO FACE EVALUATION WAS  PERFORMED  Maisyn Nouri P Seith Aikey 12/14/2018, 9:12 AM

## 2018-12-14 NOTE — Progress Notes (Addendum)
Physical Therapy Session Note  Patient Details  Name: [name] MRN: [id number] Date of Birth: 09/26/1927  Today's Date: 12/14/2018 PT Individual Time: 0900-0930, 0940-1000, and 1505-1530 PT Individual Time Calculation (min): 30 min, 20 min, and 25 min    Short Term Goals: Week 1:  PT Short Term Goal 1 (Week 1): Pt will be able to demonstrate functional dynamic sitting balance with min assist PT Short Term Goal 2 (Week 1): Pt will be able to perform bed <> chair transfers with max assist  Skilled Therapeutic Interventions/Progress Updates:     Session 1: Patient in bed upon PT arrival. Patient alert and agreeable to PT session. Patient reported 4/10 L foot pain during session, RN made aware. PT provided repositioning, rest breaks, and distraction as pain interventions throughout session.   Therapeutic Activity: Bed Mobility: Patient performed rolling R/L x5 with mod-min A using bed rails while donning/doffing incontinence brief and shorts x2 due to incontinence of bladder upon PT arrival and for RN to perform in/out cath during session, prior to patient transferring to the w/c. He perform supine to/from sit with with max-mod A. Provided verbal cues for brining LEs off the bed then pushing to elbows and hands to sit up. Transfers: Patient performed a slide board transfer with max A +2. Provided verbal cues for head hips relationship, hand placement, NWB on L LE, placed a pillow under his L heel to prevent weight bearing.  Neuromuscular Re-ed: Patient performed sitting balance EOB, static balance x2 min progressing from mod A-CGA and close supervision for 5 seconds. Perform forward and lateral leans in sitting for dynamic balance and preparation for slide board transfer with CGA-min A, able to eventually lean to elbows with incouragement. Bed elevated to prevent weight bearing through L LE.  Therapeutic Exercise: Patient performed the following exercises with verbal and tactile cues for  proper technique. -B SLR x10 with <25% assist -B quat sets x10 with 5 sec hold -B hip and knee flexion 2x5 with 50% assist, noted tightness with hip flexion bilaterally  Patient in w/c in room at end of session with breaks locked, seat belt alarm set, and all needs within reach. Provided patient with new R limb pad and L ELR with towels for padding and support during session.  Session 2: Patient in bed asleep with his son at bedside upon PT arrival. Patient alert and agreeable to PT session. Patient reported 5/10 L foot pain during session, RN made aware, patient's son reported that the patient had received Tylenol prior to session. PT provided repositioning, rest breaks, and distraction as pain interventions throughout session. Discussed lift equipment at patient's home with his son and discussed obtaining a sling that would allow B LE NWB to accommodate patient's precautions and trying the sling with an able bodied person and taking a video to assess safe function of the lift for the patient. Patient's son in agreement.   Therapeutic Activity: Bed Mobility: Patient performed rolling R/L with min-mod A with use of bed rails and scooting up in the bed with total A of 2 in a flat bed. Provided verbal cues for reaching with the opposite arm and leg and maintaining L LE NWB to roll and educated patient's son on proper body mechanics for scooting the patient up in the bed. Patient fell back asleep once adjusted in the bed. Patient missed 5 min of skilled PT due to fatigue.  Patient in bed at end of session with breaks locked, bed alarm set, and  all needs within reach.   Therapy Documentation Precautions:  Precautions Precautions: Fall Precaution Comments: preexisting R BKA; keep left Le elevated Restrictions Weight Bearing Restrictions: Yes LLE Weight Bearing: Non weight bearing General: PT Amount of Missed Time (min): 10 min and 5 Minutes  PT Missed Treatment Reason: Nursing care and Patient  fatigue    Therapy/Group: Individual Therapy  Brynn Reznik L [name] PT, DPT  12/14/2018, 3:55 PM

## 2018-12-14 NOTE — Progress Notes (Signed)
Occupational Therapy Session Note  Patient Details  Name: HARSHAAN WHANG MRN: [id number] Date of Birth: 23-Jan-1927  Today's Date: 12/14/2018 OT Individual Time: 1300-1340 OT Individual Time Calculation (min): 40 min    Short Term Goals: Week 1:     Skilled Therapeutic Interventions/Progress Updates:    Pt resting in w/c upon arrival with son present.  Slide board transfer to bed with tot A+2. Pt required max A+2 for sit>supine.  OT intervention with focus on bed mobility rolling R<>L with bed rails.  Per son, pt has hospital bed at home. Pt performed half crunches with HOB elevated ~45*.  Pt sat EOB with max A for supine>sit EOB and practiced trunk flexion sitting EOB.  Pt also practiced lateral leans.  Pt able to lean on B elbows but unable to raise hip without assistance. Pt returned to supine with max A and +2 for repositioning in bed. Pt remained in bed with all needs within reach and bed alarm activated.   Therapy Documentation Precautions:  Precautions Precautions: Fall Precaution Comments: preexisting R BKA; keep left Le elevated Restrictions Weight Bearing Restrictions: Yes LLE Weight Bearing: Non weight bearing  Pain:  Pt with no c/o pain this afternoon   Therapy/Group: Individual Therapy  [name] 12/14/2018, 2:40 PM

## 2018-12-14 NOTE — Plan of Care (Signed)
  Problem: Consults Goal: RH LIMB LOSS PATIENT EDUCATION Description: Description: See Patient Education module for eduction specifics. Outcome: Progressing   Problem: RH BLADDER ELIMINATION Goal: RH STG MANAGE BLADDER WITH ASSISTANCE Description: STG Manage Bladder With Assistance Min Outcome: Progressing   Problem: RH SKIN INTEGRITY Goal: RH STG SKIN FREE OF INFECTION/BREAKDOWN Description: Skin free of breakdown, infection Outcome: Progressing   Problem: RH PAIN MANAGEMENT Goal: RH STG PAIN MANAGED AT OR BELOW PT'S PAIN GOAL Description: Less than 4 Outcome: Progressing   Problem: RH KNOWLEDGE DEFICIT LIMB LOSS Goal: RH STG INCREASE KNOWLEDGE OF SELF CARE AFTER LIMB LOSS Description: Patient/family will be able to describe self care of limb including pain management, wound care and s/s of infection with cues/handouts Outcome: Progressing

## 2018-12-15 ENCOUNTER — Inpatient Hospital Stay (HOSPITAL_COMMUNITY): Payer: Medicare Other | Admitting: Occupational Therapy

## 2018-12-15 ENCOUNTER — Inpatient Hospital Stay (HOSPITAL_COMMUNITY): Payer: Medicare Other

## 2018-12-15 LAB — GLUCOSE, CAPILLARY
Glucose-Capillary: 149 mg/dL — ABNORMAL HIGH (ref 70–99)
Glucose-Capillary: 159 mg/dL — ABNORMAL HIGH (ref 70–99)
Glucose-Capillary: 129 mg/dL — ABNORMAL HIGH (ref 70–99)
Glucose-Capillary: 152 mg/dL — ABNORMAL HIGH (ref 70–99)

## 2018-12-15 NOTE — Progress Notes (Signed)
Occupational Therapy Session Note  Patient Details  Name: [name] MRN: [id number] Date of Birth: 02/04/1927  Today's Date: 12/15/2018 OT Individual Time: 5053-9767 OT Individual Time Calculation (min): 55 min    Short Term Goals: Week 1:     Skilled Therapeutic Interventions/Progress Updates:    OT intervention with focus on w/c moblity, BUE therex, discharge planning with son, and activity tolerance to increase independence with BADLs. Pt supervision for propelling w/c with more than a reasonable amount of time when propelling straight but requires assistance maneuvering through doorways. Pt educated on repositioning in w/c and boosting.  Discussed with son use of hoyer lift or similar type lift when discharged. Pt remained in w/c with all needs within reach and son present. Belt alarm activated.   Therapy Documentation Precautions:  Precautions Precautions: Fall Precaution Comments: preexisting R BKA; keep left Le elevated Restrictions Weight Bearing Restrictions: Yes LLE Weight Bearing: Non weight bearing  Pain: Pain Assessment Pain Scale: 0-10 Pain Score: 0-No pain Pain Type: Acute pain;Surgical pain Pain Location: Foot Pain Orientation: Left Pain Descriptors / Indicators: Aching Pain Frequency: Constant Pain Onset: On-going Patients Stated Pain Goal: 4 Pain Intervention(s): repositioned, RN aware  Therapy/Group: Individual Therapy  [name] 12/15/2018, 12:12 PM

## 2018-12-15 NOTE — Progress Notes (Signed)
[facility] PHYSICAL MEDICINE & REHABILITATION PROGRESS NOTE   Subjective/Complaints:  Pt reports no issues- LBM yesterday.    ROS- denies SOB, CP, N/V/D/C; pain is controlled usually  Objective:   No results found. Recent Labs    12/14/18 0552  WBC 9.5  HGB 9.5*  HCT 28.5*  PLT 304   Recent Labs    12/14/18 0552  NA 132*  K 3.6  CL 94*  CO2 28  GLUCOSE 145*  BUN 27*  CREATININE 1.41*  CALCIUM 8.8*    Intake/Output Summary (Last 24 hours) at 12/15/2018 0919 Last data filed at 12/15/2018 0700 Gross per 24 hour  Intake 320 ml  Output 2825 ml  Net -2505 ml     Physical Exam: Vital Signs Blood pressure (!) 142/61, pulse 60, temperature 97.8 F (36.6 C), temperature source Oral, resp. rate 18, height 5\' 11"  (1.803 m), weight 94.3 kg, SpO2 96 %.  Physical Exam  Gen:in pain, NAD, watching TV- just woke up and a little vague/dazed, appears comfortable.  HEENT: oral mucosa pink and moist, NCAT Cardio: Regular rate- has holosystolic murmur;  Chest: normal effort, normal rate of breathing; CTA B/L- adequate air movement B/L Abd: soft, non-distended; NT, ND, (+)BS Ext: no edema of LEs Skin: Right BKA well-healed.  Left transmetatarsal amputation site dressed with ACE wrap in place on L TMA.  Appropriately tender. Neuro: pt is vague and slow to process, but remembers examiner.  Musculoskeletal: 4/5 in upper extremities bilaterally. Patient is unable to participate in lower extremity testing  Psych: pleasant, normal affect; appears appropriate    Assessment/Plan: 1. Functional deficits secondary to L TMA in setting of R older BKA which require 3+ hours per day of interdisciplinary therapy in a comprehensive inpatient rehab setting.  Physiatrist is providing close team supervision and 24 hour management of active medical problems listed below.  Physiatrist and rehab team continue to assess barriers to discharge/monitor patient progress toward functional and medical  goals  Care Tool:  Bathing    Body parts bathed by patient: Right arm, Left arm, Chest, Abdomen, Face, Left upper leg, Right upper leg   Body parts bathed by helper: Buttocks, Left lower leg Body parts n/a: Right lower leg, Front perineal area   Bathing assist Assist Level: Maximal Assistance - Patient 24 - 49%     Upper Body Dressing/Undressing Upper body dressing   What is the patient wearing?: Pull over shirt    Upper body assist Assist Level: Maximal Assistance - Patient 25 - 49%    Lower Body Dressing/Undressing Lower body dressing      What is the patient wearing?: Incontinence brief     Lower body assist Assist for lower body dressing: Total Assistance - Patient < 25%     Toileting Toileting    Toileting assist Assist for toileting: Total Assistance - Patient < 25%     Transfers Chair/bed transfer  Transfers assist  Chair/bed transfer activity did not occur: Safety/medical concerns  Chair/bed transfer assist level: 2 Helpers     Locomotion Ambulation   Ambulation assist   Ambulation activity did not occur: Safety/medical concerns(NWB LLE and R BKA)          Walk 10 feet activity   Assist  Walk 10 feet activity did not occur: Safety/medical concerns        Walk 50 feet activity   Assist Walk 50 feet with 2 turns activity did not occur: Safety/medical concerns  Walk 150 feet activity   Assist Walk 150 feet activity did not occur: Safety/medical concerns         Walk 10 feet on uneven surface  activity   Assist Walk 10 feet on uneven surfaces activity did not occur: Safety/medical concerns         Wheelchair     Assist Will patient use wheelchair at discharge?: Yes Type of Wheelchair: Manual    Wheelchair assist level: Supervision/Verbal cueing Max wheelchair distance: 44'    Wheelchair 50 feet with 2 turns activity    Assist        Assist Level: Supervision/Verbal cueing   Wheelchair 150  feet activity     Assist  Wheelchair 150 feet activity did not occur: Safety/medical concerns(endurance)       Blood pressure (!) 142/61, pulse 60, temperature 97.8 F (36.6 C), temperature source Oral, resp. rate 18, height 5\' 11"  (1.803 m), weight 94.3 kg, SpO2 96 %.    Medical Problem List and Plan: 1.  Decreased functional mobility secondary to left transmetatarsal amputation 12/02/2018 with wound VAC applied as well as history of right BKA August 2020             -Will consult with surgical team to determine when patient is cleared to shower.   11/26- didn't see wound VAC that was mentioned; will d/w nursing- maybe under dressing?   11/28- wound VAC is off             -ELOS/Goals: MinA in PT and ADLs, independent in SLP             Sleeping well, pain controlled, moving bowels. 2.  Antithrombotics: -DVT/anticoagulation: Eliquis             -antiplatelet therapy: Plavix 75 mg daily 3. Pain Management: Ultram as needed.   11/26- pain meds work "sometimes" per pt- pt on Tylenol 650 mg q4 hours prn; tramadol 50 mg q6 hours prn; or oxycodone q4 hours prn. 4. Mood: Provide emotional support             -antipsychotic agents: N/A 5. Neuropsych: This patient is capable of making decisions on his own behalf. 6. Skin/Wound Care: Routine skin checks 7. Fluids/Electrolytes/Nutrition:  Monitor I/O. Check lytes in am.  8.  Acute blood loss anemia.  Follow-up CBC--history of heme positive stools.  Continue iron supplement 9.  PAF.  Cardiac rate controlled.  Continue Eliquis.  Follow-up cardiology services as needed 10.  Diastolic congestive heart failure.  Monitor weight daily--have been inconsistent. Will continue Lasix 20 mg daily--s/p IV lasix 20 mg 11/24. Was on 40 mg bid PTA due to recent decompensation.  Will recheck CXR and may need to resume due to progressive pleural effusions.   11/26- on Lasix 20 mg daily- will con't and monitor daily weights  11/28- Weight wasn't checked daily,  but increased 96 to 97.3 kg in last 3 days- BUN up to 27, so don't want to increase Lasix currently- labs Monday   11/29- weight back down to 96.7kg  11/30: weight decreased to 94.5 kg.  121- weight 94.3 kg   Filed Weights   12/14/18 0537 12/14/18 1448 12/15/18 0500  Weight: 94.5 kg 95.1 kg 94.3 kg   11.  Hypertension.  Norvasc 10 mg daily.  Monitor with increased mobility 12.  Diabetes mellitus with peripheral neuropathy.  Latest hemoglobin A1c 7.0.  SSI.  Patient on glipizide hold. CBG (last 3)  Recent Labs    12/14/18 1635 12/14/18  2126 12/15/18 0554  GLUCAP 146* 128* 149*    11/29- BGs adequate control- 1 high BG  12/1: BG well controlled.  13.  Hypothyroidism. On Synthroid for supplement 14.  BPH: Monitor voiding with PVR checks. Continue Proscar 5 mg daily, Flomax 0.4 mg twice daily.   15.  Hyperlipidemia.  Lipitor  16. Leucocytosis/?PNA: Monitor cough--on Ceftriaxone/Azithormycin D#2  11/26- on Rocephin for CAP 1 g q24 hours currently- WBC down to 11.1k from 12.7k.  11/27- will recheck labs in AM  11/28- WBC down to 10.6k- trending in right direction- if increases, recheck CXR  11/30: Resolved 17. CKD stage III: Will monitor renal status with serial checks.   11/26- Cr up slightly to 1.48  11/27- will recheck labs in AM  11/28- Cr 1.41- slightly less, but BUN up to 27 from 21- will enocourage PO/fluid intake  12/1- Cr stable at 1.4 and BUN still 27- likely due to IV Lasix Sunday 18. Hyponatremia  11/26- Na 131 up from 130.  11/28- Na 129-will recheck Monday- is a little low, but basically stable.   11/30: Na Improved to 132 19. Urinary retention with BPH  11/28- needed in/out cath last night for retention- on Proscar AND Flomax 0.4 mg BID - cannot increase- will check PVRs and cath if needed.    LOS: 6 days A FACE TO FACE EVALUATION WAS PERFORMED  Briselda Naval 12/15/2018, 9:19 AM

## 2018-12-15 NOTE — Progress Notes (Addendum)
Physical Therapy Session Note  Patient Details  Name: [name] MRN: [id number] Date of Birth: 08/09/1927  Today's Date: 12/15/2018 PT Individual Time: 0815-0910 PT Individual Time Calculation (min): 55 min   Short Term Goals: Week 1:  PT Short Term Goal 1 (Week 1): Pt will be able to demonstrate functional dynamic sitting balance with min assist PT Short Term Goal 2 (Week 1): Pt will be able to perform bed <> chair transfers with max assist  Skilled Therapeutic Interventions/Progress Updates:     Patient in in bed eating breakfast upon PT arrival. Patient missed 5 min of skilled PT due to finishing breakfast. Patient alert and agreeable to PT session once finished eating. Patient reported 6/10 L foot pain during session, RN made aware and provided pain medicine during session. PT provided repositioning, rest breaks, and distraction as pain interventions throughout session.   Therapeutic Activity: Bed Mobility: Patient performed rolling R and L to don shorts with min A and total A for LB dressing. He performed supine to sit with mod-max A for trunk support x1 and mod A for trunk support x1 performing log roll before coming to sitting. Provided verbal cues for bringing opposite LE across his body to roll and pushing through his arms to sit up. He performed sit to supine x1 with supervision in a flat bed with min use of bed rails.  Transfers: Patient performed a level slide board transfer with max-mod A +2 and total A for board placement. Provided verbal cues for hand placement and head-hips relationship with manual facilitation for forward lean.  Wheelchair Mobility:  Patient propelled wheelchair 80 feet with supervision and increased time due to decreased UE strength/activity tolerance. Provided verbal cues for turning technique.  Therapeutic Exercise: Patient performed the following exercises sitting EOB with close supervision for sitting balance with verbal and tactile cues for proper  technique. -B LAQ 2x10 -B isometric hip adduction with 5 second holds x10 -L hamstring stretch 2x1 min   Patient in w/c at end of session with breaks locked, seat belt alarm set, and all needs within reach. Educated patient on goals and POC during session.    Therapy Documentation Precautions:  Precautions Precautions: Fall Precaution Comments: preexisting R BKA; keep left Le elevated Restrictions Weight Bearing Restrictions: Yes LLE Weight Bearing: Non weight bearing General: PT Amount of Missed Time (min): 5 Minutes PT Missed Treatment Reason: Other (Comment)(Patient eating breakfast)    Therapy/Group: Individual Therapy  Davy Westmoreland L Elyssa Pendelton PT, DPT  12/15/2018, 12:12 PM

## 2018-12-15 NOTE — Progress Notes (Signed)
Occupational Therapy Session Note  Patient Details  Name: [name] MRN: [id number] Date of Birth: 27-Jan-1927  Today's Date: 12/15/2018 OT Individual Time: 1400-1500 OT Individual Time Calculation (min): 60 min    Skilled Therapeutic Interventions/Progress Updates:    1:1 Son present for session. Continued to discussed home recommendations and d/c plans. Family plans to use a lift for transfers to bed, recliner, w/c and BSC. Pt's son reports they think they have a hoyer lift that may work better than the one they were using to help stand him (due to now being NWB on the left). Son waiting for picture of the one they might have access to. Agreeable to seeing a demonstration of our home hoyer option; with focus/ education on placement of pad in supine in the bed to lifting with a second person to the w/c. Discussed removal of pad and how pt would have to remove clothing for transfers on/ off BSC. Pt applied lotion and skin check of right LE.  Transported to the gym and focus on UB and core strengthening and endurance training with 2 lb weighted bar, resistive exercises with peach theraband, tolerance of long sitting and propelling w/c up the hallway.   Therapy Documentation Precautions:  Precautions Precautions: Fall Precaution Comments: preexisting R BKA; keep left Le elevated Restrictions Weight Bearing Restrictions: Yes LLE Weight Bearing: Non weight bearing   Pain: C/o numbness in right foot - reports relief with elevation; kept foot elevated for whole session   Therapy/Group: Individual Therapy  [name] [facility] 12/15/2018, 4:01 PM

## 2018-12-15 NOTE — Patient Care Conference (Signed)
Inpatient RehabilitationTeam Conference and Plan of Care Update Date: 12/15/2018   Time: 11:35 AM    Patient Name: [name]      Medical Record Number: [id number]  Date of Birth: 06-Dec-1927 Sex: Male         Room/Bed: 4M07C/4M07C-01 Payor Info: Payor: Marine scientist / Plan: [employer] MEDICARE / Product Type: *No Product type* /    Admit Date/Time:  12/09/2018  4:45 PM  Primary Diagnosis:  History of transmetatarsal amputation of left foot [facility])  Patient Active Problem List   Diagnosis Date Noted  . History of transmetatarsal amputation of left foot ([place]) 12/09/2018  . Critical limb ischemia with history of revascularization of same extremity   . Gangrene of left foot ([place])   . Diabetic ulcer of toe of left foot associated with type 2 diabetes mellitus ([place])   . Cellulitis of left lower extremity   . Toe infection 11/18/2018  . Peripheral edema   . Acute on chronic diastolic CHF (congestive heart failure) ([place]) 11/09/2018  . S/P BKA (below knee amputation) unilateral, right ([place]) 10/05/2018  . Hyponatremia   . Essential hypertension   . Postoperative pain   . Pain of left heel   . Unable to maintain weight-bearing   . Type 2 diabetes mellitus with diabetic peripheral angiopathy with gangrene ([place])   . Anemia due to acute blood loss   . Stage 3 chronic kidney disease   . Acquired absence of right leg below knee ([place]) 09/07/2018  . Gangrene of right foot ([place])   . DNR (do not resuscitate) discussion   . Goals of care, counseling/discussion   . Palliative care by specialist   . Type 2 diabetes mellitus with foot ulcer and gangrene ([place])   . Severe protein-calorie malnutrition ([place])   . Ischemic foot 08/20/2018  . Critical lower limb ischemia 08/03/2018  . Type 2 diabetes mellitus with foot ulcer, without long-term current use of insulin ([place])   . Gastroesophageal reflux disease   . Cellulitis of great toe of right foot 05/29/2018  . Atrial fibrillation, chronic   .  Chest pain 07/25/2017  . PAF (paroxysmal atrial fibrillation) ([place]) 07/25/2017  . BPH (benign prostatic hyperplasia) 07/25/2017  . Shortness of breath 05/11/2017  . Hypothyroidism 05/11/2017  . Chronic diastolic CHF (congestive heart failure) ([place]) 05/11/2017  . Moderate aortic stenosis 06/12/2016  . RBBB 06/12/2016  . Peripheral vascular disease ([place]) 08/04/2012  . Essential hypertension, benign 08/04/2012  . Hyperlipidemia 08/04/2012  . Dizziness 08/04/2012    Expected Discharge Date: Expected Discharge Date: 12/19/18  Team Members Present: Physician leading conference: Dr. Courtney Heys Social Worker Present: Ovidio Kin, LCSW Nurse Present: Rozetta Nunnery, RN Case Manager: Karene Fry, RN PT Present: Apolinar Junes, PT OT Present: Roanna Epley, [place], OT SLP Present: Weston Anna, SLP PPS Coordinator present : Gunnar Fusi, SLP     Current Status/Progress Goal Weekly Team Focus  Bowel/Bladder   Pt continent of B/B, urinary retention so I/O cath q6 or >250, LBM 11/30  continue with I/O caths  assess toileting q shift and prn   Swallow/Nutrition/ Hydration             ADL's   bed mobility-rolling R<>L-min A; supine<>sit-max A; sitting balance, CGA/minA; slide board transfers-tot A+2; decrease activity tolerance  toileting-mod A; LB dressing-mod A at bed level; dynamic sitting balance-CGA; hoyer transfers by family-independent  bed mobility, activity tolerance, slide board transfers, hoyer transfers, family educaiton; sitting balance   Mobility  Min-mod A bed mobility, min-CGA sitting balance, max A+2 slide board transfers, supervision w/c  CGA bed mobility with hospital bed features, mod A slide board transfers, supervision w/c mobility 150'  bed mobility, sitting balance, w/c mobility, activity tolerance, edema control, prosthetic education and training, transfer training, and patient/caregiver education   Communication             Safety/Cognition/  Behavioral Observations            Pain   pt c/o of pain, has prns  pain less than 5  assess pain q shift and prn   Skin   Old R BKA, L toes amputated w/ dressing change, ecchymosis on groin, skin tear on penis  continue w/ wound care, prevent further skin breakdown  assess skin q shift and prn    Rehab Goals Patient on target to meet rehab goals: Yes *See Care Plan and progress notes for long and short-term goals.     Barriers to Discharge  Current Status/Progress Possible Resolutions Date Resolved   Nursing                  PT  Weight bearing restrictions  B NWB unless he can tolerate use of new R prosthesis  Increased assistance with bed mobility and decreased sitting balance, discussing use of lift for home transfers with family           OT                  SLP                SW                Discharge Planning/Teaching Needs:  Home with adult children (3) who rotate in providing 24/7 assistance.  Teaching to be completed prior to d/c.   Team Discussion: Old R BKA, new L TMA, BUN 27, I+O caths, BPH/on flomax, IV lasix Sunday over overload/swelling.  RN - Cont B/B, groin exxymosis, BS 149 and covered with 2U, will change dressing L foot later today.  OT LB dressing min/mod goals, family asking for a lift at home.  PT bed min/mod, slideboard max +2, goals mod +1 for transfers.  Prosthesis to arrive tomorrow.  Will need fam ed training on a lift.  Son caregiver during the week.   Revisions to Treatment Plan: N/A     Medical Summary Current Status: skin tear on penis; echymoses on groin; BGs 140s; look at TMA this afternoon Weekly Focus/Goal: focus on Bed mobility; can't do SB transfer at home- needs a lift  Barriers to Discharge: Behavior;Neurogenic Bowel & Bladder;Medical stability;Wound care;Weight bearing restrictions;Nutrition means;Medication compliance;Home enviroment access/layout;Decreased family/caregiver support  Barriers to Discharge Comments: has to use lift at  home- hoyer?; doesn't have bathing goal; primarily transfers/bed goals Possible Resolutions to Barriers: min-mod assist for bed mobility; this AM was supervision level; SB transfers max assist of 2; getting prothesis tomorrow for RLE BKA   Continued Need for Acute Rehabilitation Level of Care: The patient requires daily medical management by a physician with specialized training in physical medicine and rehabilitation for the following reasons: Direction of a multidisciplinary physical rehabilitation program to maximize functional independence : Yes Medical management of patient stability for increased activity during participation in an intensive rehabilitation regime.: Yes Analysis of laboratory values and/or radiology reports with any subsequent need for medication adjustment and/or medical intervention. : Yes   I attest that I was present, lead the team  conference, and concur with the assessment and plan of the team.   Retta Diones 12/15/2018, 4:54 PM  Team conference was held via web/ teleconference due to [place] - 19

## 2018-12-16 ENCOUNTER — Inpatient Hospital Stay (HOSPITAL_COMMUNITY): Payer: Medicare Other

## 2018-12-16 LAB — GLUCOSE, CAPILLARY
Glucose-Capillary: 128 mg/dL — ABNORMAL HIGH (ref 70–99)
Glucose-Capillary: 152 mg/dL — ABNORMAL HIGH (ref 70–99)
Glucose-Capillary: 116 mg/dL — ABNORMAL HIGH (ref 70–99)
Glucose-Capillary: 152 mg/dL — ABNORMAL HIGH (ref 70–99)

## 2018-12-16 MED ORDER — ENOXAPARIN SODIUM 40 MG/0.4ML ~~LOC~~ SOLN
40.0000 mg | Freq: Once | SUBCUTANEOUS | Status: DC
  Filled 2018-12-16: qty 0.4

## 2018-12-16 MED ORDER — POLYETHYLENE GLYCOL 3350 17 G PO PACK
17.0000 g | PACK | Freq: Every day | ORAL | Status: DC
  Administered 2018-12-16 – 2018-12-17 (×2): 17 g via ORAL
  Filled 2018-12-16 (×2): qty 1

## 2018-12-16 MED ORDER — ACETAMINOPHEN 325 MG PO TABS
650.0000 mg | ORAL_TABLET | Freq: Three times a day (TID) | ORAL | Status: DC
  Administered 2018-12-16 – 2018-12-18 (×8): 650 mg via ORAL
  Filled 2018-12-16 (×8): qty 2

## 2018-12-16 NOTE — Discharge Summary (Signed)
Physician Discharge Summary  Patient ID: [name] MRN: [id number] DOB/AGE: 03-09-1927 85 y.o.  Admit date: 12/09/2018 Discharge date: 12/18/2018  Discharge Diagnoses:  Principal Problem:   History of transmetatarsal amputation of left foot ([place]) Active Problems:   Peripheral vascular disease (H[facility])   PAF (paroxysmal atrial fibrillation) (H[facility])   Atrial fibrillation, chronic   Type 2 diabetes mellitus with foot ulcer and gangrene ([place])   S/P BKA (below knee amputation) unilateral, right ([place])   Intermittent urinary retention  Discharged Condition: stable   Significant Diagnostic Studies: Dg Chest 2 View  Result Date: 12/09/2018 CLINICAL DATA:  Follow-up from previous exam EXAM: CHEST - 2 VIEW COMPARISON:  12/08/2018 FINDINGS: Cardiac shadow is enlarged but stable. Aortic calcifications are again seen. Bibasilar effusions and atelectasis/infiltrate are again noted left slightly greater than right stable from recent exam. No acute bony abnormality is noted. IMPRESSION: Stable bibasilar effusions and atelectasis/infiltrate. Electronically Signed   By: Inez Catalina M.D.   On: 12/09/2018 19:25   Dg Chest Port 1 View  Result Date: 12/08/2018 CLINICAL DATA:  Leukocytosis. EXAM: PORTABLE CHEST 1 VIEW COMPARISON:  11/18/2018. FINDINGS: Stable cardiomegaly. Bibasilar pulmonary infiltrates/edema, progressed from prior exam. Bilateral moderate pleural effusions again noted. No pneumothorax. IMPRESSION: Stable cardiomegaly. Bibasilar pulmonary infiltrates/edema, progressed from prior exam. Progressive CHF and/or pneumonia could present this fashion. Bilateral moderate pleural effusions again noted without interim change. Electronically Signed   By: Marcello Moores  Register   On: 12/08/2018 09:46    Labs:  Basic Metabolic Panel: BMP Latest Ref Rng & Units 12/17/2018 12/14/2018  Glucose 70 - 99 mg/dL 131(H) 145(H)  BUN 8 - 23 mg/dL 25(H) 27(H)  Creatinine 0.61 - 1.24 mg/dL 1.46(H) 1.41(H)  BUN/Creat  Ratio 10 - 24 - -  Sodium 135 - 145 mmol/L 134(L) 132(L)  Potassium 3.5 - 5.1 mmol/L 3.9 3.6  Chloride 98 - 111 mmol/L 97(L) 94(L)  CO2 22 - 32 mmol/L 27 28  Calcium 8.9 - 10.3 mg/dL 8.9 8.8(L)    CBC: Lab 12/17/18 0646  WBC 8.7  NEUTROABS 4.5  HGB 9.8*  HCT 29.3*  MCV 90.7  PLT 351     Brief HPI:   [name] is a 85 y.o. male with history of T2DM, HTN, PAF, HTN  PVD s/p R-BKA and left foot ulcer who was admitted on 11/18/18 with progressive redness of left foot with concerns of osteomyelitis. He underwent revascularization with arthrectomy of distal L-SFA and popliteal artery 11/23/18 as well as stenting with angioplasty of distal left PA in attempts of limb salvage.   He underwent left transmetatarsal amputation with placement of wound VAC on 12/02/18 by Dr. Sharol Given. He was made NWB and wound VAC was removed prior to discharge to CIR. Hospital course significant for leucocytosis with CXR showing worsening of bilateral lung opacities and he was started on Ceftriaxone and Zithromax to complete 5 day antibiotic course. Therapy evaluated patient and CIR recommended due to functional deficits.    Hospital Course: [name] was admitted to rehab 12/09/2018 for inpatient therapies to consist of PT, ST and OT at least three hours five days a week. Past admission physiatrist, therapy team and rehab RN have worked together to provide customized collaborative inpatient rehab. He was maintained on ceftriaxone thorough 11/29. Pain left foot has been managed with prn use of ultram and/or tylenol. His blood pressures were monitored on TID basis and are stable. Daily weights ordered and were noted to be on upward trend ti 213 lbs.  He was also noted to have peripheral and presacral edema therefore received 40 mg IV lasix with weight trending at 207-208 lbs range. He has also had intermittent urinary retention requiring I/O caths 1-2 X day for PVR> 350cc. He reported constipation X 3 days and  Miralax  added 12/2 with good results. [facility] was contacted to deliver his prosthesis and training was initiated with focus on NWB on LLE.  His foot wound was had progressive  ischemia along the wound edges which has progressed with edema and erythema. Dr.Duda was contacted for input and recommended left BKA due to poor healing. He was discharged to acute hospital for surgery on 12/18/18   Rehab course: During patient's stay in rehab team conferences was held to monitor patient's progress, set goals and discuss barriers to discharge. At admission, patient required max to total assist with ADL tasks and for SB transfers. He  has had improvement in activity tolerance, balance, postural control as well as ability to compensate for deficits. He required mod assist to get to edge of bed. He required mod assist for UB dressing and total assist for SB transfer.    Current medications:  1. Tylenol 650 mg po with meals and at bedtime  2. Norvasc 10 mg po daily. 3. Lipitor 40 mg po daily. 4. Cosopt one drop OU bid. 5. Pepcid 20 mg po daily. 6. Proscar 5 mg daily po in pm. 7. Lasix 20 mg po daily. 8. Niferex 150 mg po daily. 9. Levothyroxine 50 mcg po daily. 10. Kdur 10 meq po daily. 11. Flomax 0.4 mg po bid. 12. Ultram 50 mg qid prn.  13. Miralax 17 gram in 8 ounces po daily. 14. Vitamin 100 mcg po daily.   Diet: Heart Healthy.   Special instructions:  1. Monitor voiding with PVR checks and cath for volumes >350cc. Use coude cath and lidocaine jelly.  2. Toilet patient every 3 hours at the edge of bed.    Disposition:  Acute hospital    Follow-up Information    [name], MD Follow up.   Specialty: Orthopedic Surgery Contact information: [place] [address] [phone]        Courtney Heys, MD Follow up.   Specialty: Physical Medicine and Rehabilitation Why: Office will call you with follow up appointment Contact information: [address]  [place] [address] [phone]           Signed: Bary Leriche 12/21/2018, 8:33 PM

## 2018-12-16 NOTE — Progress Notes (Signed)
Orthopedic Tech Progress Note Patient Details:  [name] 10-06-1927 [id number] Called to [facility] regarding "last Friday's order" not sure to what it is, but the lady at [facility] said "[name] knows about it" so the patient will be service today. Patient ID: [name], male   DOB: [birth year], 85 y.o.   MRN: [id number]   [name] 12/16/2018, 9:52 AM

## 2018-12-16 NOTE — Progress Notes (Addendum)
Physical Therapy Session Note  Patient Details  Name: [name] MRN: [id number] Date of Birth: [birth year]  Today's Date: 12/16/2018 PT Individual Time: 1015-1130 PT Individual Time Calculation (min): 75 min   Short Term Goals: Week 1:  PT Short Term Goal 1 (Week 1): Pt will be able to demonstrate functional dynamic sitting balance with min assist PT Short Term Goal 2 (Week 1): Pt will be able to perform bed <> chair transfers with max assist  Skilled Therapeutic Interventions/Progress Updates:     Session 1: Patient in w/c upon PT arrival. Patient alert and agreeable to PT session. Patient requested to use the restroom to have a BM at beginning of session. Transfer patient to the [facility] using the Maxi-Move with total A. Required max A +2 for lateral leans to remove shorts and incontinence brief with total A once on the [facility] with a third person holding his L LE to protect his wound. Patient was continent of bowl on the [facility], RN made aware. Patient with very hard stool and some bright red blood smear with peri-care, provided with total A. Max A +2 for lateral leans to pull up shorts with L LE propped on a pillow to reduce weight bearing. [name], [facility], brought patient's R prosthesis during session. Donned liner and prosthesis, 9 clicks in, with total A while seated on BSC. PA assessed patient's wound on his L LE and changed dressings during session. CPO and PT educated patient's son on wearing schedule for liner and prosthesis. He should wear the liner as much as possible, instead of the shrinker, and the prosthesis 30 min-1 hour 2x per day, increasing by 30 min each day. Patient performed sit to/from stand with R prosthesis and L LE NWB using the Stedy with max A +2 and manual facilitation for R weight shift. Patient returned to the bed, performing sit to supine with min A. Patient in bed with his son in the room at end of session with breaks locked, bed alarm set, and all needs in reach.  .   Session  2: Patient in bed with his son in the room upon PT arrival. Patient's son informed PT that patient is planned to have surgery for L BKA on Friday, patient and his son were just informed. PT provided comfort and education on potential mobility and POC following surgery, informing them also that it will depend upon how his mobility is after surgery. Patient appreciated PT support and education, however, declined session this afternoon due to fatigue. Patient missed 15 min of skilled PT due to fatigue.    Therapy Documentation Precautions:  Precautions Precautions: Fall Precaution Comments: preexisting R BKA; keep left Le elevated Restrictions Weight Bearing Restrictions: Yes LLE Weight Bearing: Non weight bearing General: PT Amount of Missed Time (min): 15 Minutes PT Missed Treatment Reason: Patient fatigue    Therapy/Group: Individual Therapy  Forestine Macho L Doloris Servantes PT, DPT  12/16/2018, 5:38 PM

## 2018-12-16 NOTE — Progress Notes (Signed)
Occupational Therapy Session Note  Patient Details  Name: [name] MRN: [id number] Date of Birth: 03-06-1927  Today's Date: 12/16/2018 OT Individual Time: 1300-1345 OT Individual Time Calculation (min): 45 min    Short Term Goals: Week 1:    see POC  Skilled Therapeutic Interventions/Progress Updates:    1:1. Pt received in bed with son present. Pt tearful as he was just told he will need L BKA. Pt provided tissue and support with therapuetic use of self utilized. Pt elects to complete BUE therex as distraction. Pt completes UB dowel rod HEP for global strengthening of required for sitting balance. Demo cuieng provided for technique Exited session with pt seated in bed exit alarm on and call light in reach.  Therapy Documentation Precautions:  Precautions Precautions: Fall Precaution Comments: preexisting R BKA; keep left Le elevated Restrictions Weight Bearing Restrictions: Yes LLE Weight Bearing: Non weight bearing General:   Vital Signs: Therapy Vitals Temp: 98.1 F (36.7 C) Temp Source: Oral Pulse Rate: (!) 57 BP: (!) 127/54 Patient Position (if appropriate): Lying Oxygen Therapy SpO2: 100 % Pain:   ADL: ADL Eating: Supervision/safety Grooming: Supervision/safety Where Assessed-Grooming: Sitting at sink Upper Body Bathing: Moderate assistance Where Assessed-Upper Body Bathing: Edge of bed Lower Body Bathing: Maximal assistance Where Assessed-Lower Body Bathing: Bed level Upper Body Dressing: Maximal assistance Where Assessed-Upper Body Dressing: Edge of bed Lower Body Dressing: Dependent Where Assessed-Lower Body Dressing: Edge of bed Toileting: Dependent Where Assessed-Toileting: Bed level Toilet Transfer: Not assessed Social research officer, government: Not assessed Vision   Perception    Praxis   Exercises:   Other Treatments:     Therapy/Group: Individual Therapy  Tonny Branch 12/16/2018, 4:16 PM

## 2018-12-16 NOTE — Discharge Instructions (Signed)
Inpatient Rehab Discharge Instructions  [name] Discharge date and time:    Activities/Precautions/ Functional Status: Activity: as tolerated--no weight Diet: cardiac diet Wound Care:   Functional status:  ___ No restrictions     ___ Walk up steps independently _X__ 24/7 supervision/assistance   ___ Walk up steps with assistance ___ Intermittent supervision/assistance  ___ Bathe/dress independently ___ Walk with walker     _X__ Bathe/dress with assistance ___ Walk Independently    ___ Shower independently ___ Walk with assistance    ___ Shower with assistance _X__ No alcohol     ___ Return to work/school ________  Special Instructions:    My questions have been answered and I understand these instructions. I will adhere to these goals and the provided educational materials after my discharge from the hospital.  Patient/Caregiver Signature _______________________________ Date __________  Clinician Signature _______________________________________ Date __________  Please bring this form and your medication list with you to all your follow-up doctor's appointments.

## 2018-12-16 NOTE — Progress Notes (Signed)
Occupational Therapy Session Note  Patient Details  Name: [name] MRN: [id number] Date of Birth: 1927-05-07  Today's Date: 12/16/2018 OT Individual Time: 5053-9767 OT Individual Time Calculation (min): 53 min    Short Term Goals: Week 1:   See OT POC  Skilled Therapeutic Interventions/Progress Updates:    Pt received sleeping, easily aroused and agreeable to session. Pt completed bed mobility, rolling R and L with min cueing for technique and min A overall to facilitate roll with chuck. Pt required min cueing for adherence to NWB precautions when rolling, as pt attempting to push down through LLE. Pt able to complete peri hygiene with min A for washing posteriorly thoroughly. Pt donned pants and new brief with max A bed level. Pt transitioned to EOB with mod cueing and max A. Initiated pt eating breakfast EOB but pt quickly fatigued and was unable to maintain sitting balance without mod A. 2nd person called for assistance with transfer. Max A+2 for slideboard transfer to the w/c with heavy cueing for body mechanics, especially brining trunk forward. Pt then sat in w/c and ate breakfast with set up assist for managing small containers. Pt was left sitting up with all needs met in the w/c. Chair alarm set.   Therapy Documentation Precautions:  Precautions Precautions: Fall Precaution Comments: preexisting R BKA; keep left Le elevated Restrictions Weight Bearing Restrictions: Yes LLE Weight Bearing: Non weight bearing   Therapy/Group: Individual Therapy  Curtis Sites 12/16/2018, 8:32 AM

## 2018-12-16 NOTE — Progress Notes (Signed)
S/P trans metatarsal Amputation. Now in rehab  Left foot: non palpable pulse large necrotic Changes extending over end of amputation stump . No ascending cellulitis    Spoke with Dr. Sharol Given . Best option would be a BKA. He will contact family and discuss. Would plan for BKA on friday

## 2018-12-16 NOTE — Progress Notes (Signed)
Social Work Patient ID: [name], male   DOB: [birth year], 85 y.o.   MRN: [id number]   Met with pt and son yesterday afternoon to review team conference.  Son surprised by targeted d/c date of 12/5 and expressed concerns about bladder issues and "has anybody checked his foot?".  I relayed concerns to MD.  Per ortho, it appears that discussion now about need for BKA.  Have removed targeted dc date and await further instruction on plans from MD.  [name], LCSW

## 2018-12-16 NOTE — Progress Notes (Addendum)
[facility] PHYSICAL MEDICINE & REHABILITATION PROGRESS NOTE   Subjective/Complaints:  Pt reports no issues- just woke up-  Spoke to RN last night- concerned about how wound on TMA looks. Called son [name]   ROS- denies SOB, CP, N/V/D/C; pain is controlled usually  Objective:   No results found. Recent Labs    12/14/18 0552  WBC 9.5  HGB 9.5*  HCT 28.5*  PLT 304   Recent Labs    12/14/18 0552  NA 132*  K 3.6  CL 94*  CO2 28  GLUCOSE 145*  BUN 27*  CREATININE 1.41*  CALCIUM 8.8*    Intake/Output Summary (Last 24 hours) at 12/16/2018 0842 Last data filed at 12/16/2018 0445 Gross per 24 hour  Intake 240 ml  Output 1880 ml  Net -1640 ml     Physical Exam: Vital Signs Blood pressure 124/63, pulse (!) 57, temperature 97.7 F (36.5 C), resp. rate 14, height 5\' 11"  (1.803 m), weight 93.9 kg, SpO2 99 %.  Physical Exam  Gen:in pain, NAD, watching TV- just woke up and a little vague/dazed again this AM- doesn't even have eyeglasses on, appears comfortable.  HEENT: oral mucosa pink and moist, NCAT Cardio: Regular rate- has holosystolic murmur;  Chest: normal effort, normal rate of breathing; CTA B/L- adequate air movement B/L Abd: soft, non-distended; NT, ND, (+)BS Ext: no edema of LEs Skin: Right BKA well-healed.  Left transmetatarsal amputation site dressed with ACE wrap in place on L TMA.  Appropriately tender. Neuro: pt is vague and slow to process, but remembers examiner.  Musculoskeletal: 4/5 in upper extremities bilaterally. Patient is unable to participate in lower extremity testing  Psych: pleasant, normal affect; appears appropriate    Assessment/Plan: 1. Functional deficits secondary to L TMA in setting of R older BKA which require 3+ hours per day of interdisciplinary therapy in a comprehensive inpatient rehab setting.  Physiatrist is providing close team supervision and 24 hour management of active medical problems listed below.  Physiatrist  and rehab team continue to assess barriers to discharge/monitor patient progress toward functional and medical goals  Care Tool:  Bathing    Body parts bathed by patient: Right arm, Left arm, Chest, Abdomen, Face, Left upper leg, Right upper leg   Body parts bathed by helper: Buttocks, Left lower leg Body parts n/a: Right lower leg, Front perineal area   Bathing assist Assist Level: Maximal Assistance - Patient 24 - 49%     Upper Body Dressing/Undressing Upper body dressing   What is the patient wearing?: Pull over shirt    Upper body assist Assist Level: Maximal Assistance - Patient 25 - 49%    Lower Body Dressing/Undressing Lower body dressing      What is the patient wearing?: Incontinence brief     Lower body assist Assist for lower body dressing: Total Assistance - Patient < 25%     Toileting Toileting    Toileting assist Assist for toileting: Total Assistance - Patient < 25%     Transfers Chair/bed transfer  Transfers assist  Chair/bed transfer activity did not occur: Safety/medical concerns  Chair/bed transfer assist level: 2 Helpers     Locomotion Ambulation   Ambulation assist   Ambulation activity did not occur: Safety/medical concerns(NWB LLE and R BKA)          Walk 10 feet activity   Assist  Walk 10 feet activity did not occur: Safety/medical concerns        Walk 50 feet activity  Assist Walk 50 feet with 2 turns activity did not occur: Safety/medical concerns         Walk 150 feet activity   Assist Walk 150 feet activity did not occur: Safety/medical concerns         Walk 10 feet on uneven surface  activity   Assist Walk 10 feet on uneven surfaces activity did not occur: Safety/medical concerns         Wheelchair     Assist Will patient use wheelchair at discharge?: Yes Type of Wheelchair: Manual    Wheelchair assist level: Supervision/Verbal cueing Max wheelchair distance: 4'    Wheelchair 50  feet with 2 turns activity    Assist        Assist Level: Supervision/Verbal cueing   Wheelchair 150 feet activity     Assist  Wheelchair 150 feet activity did not occur: Safety/medical concerns(endurance)       Blood pressure 124/63, pulse (!) 57, temperature 97.7 F (36.5 C), resp. rate 14, height 5\' 11"  (1.803 m), weight 93.9 kg, SpO2 99 %.    Medical Problem List and Plan: 1.  Decreased functional mobility secondary to left transmetatarsal amputation 12/02/2018 with wound VAC applied as well as history of right BKA August 2020             -Will consult with surgical team to determine when patient is cleared to shower.   11/26- didn't see wound VAC that was mentioned; will d/w nursing- maybe under dressing?   11/28- wound VAC is off  12/2- will call Vascular because wound is not looking good.             -ELOS/Goals: MinA in PT and ADLs, independent in SLP             Sleeping well, pain controlled, moving bowels. 2.  Antithrombotics: -DVT/anticoagulation: Eliquis             -antiplatelet therapy: Plavix 75 mg daily 3. Pain Management: Ultram as needed.   11/26- pain meds work "sometimes" per pt- pt on Tylenol 650 mg q4 hours prn; tramadol 50 mg q6 hours prn; or oxycodone q4 hours prn. 4. Mood: Provide emotional support             -antipsychotic agents: N/A 5. Neuropsych: This patient is capable of making decisions on his own behalf. 6. Skin/Wound Care: Routine skin checks 7. Fluids/Electrolytes/Nutrition:  Monitor I/O. Check lytes in am.  8.  Acute blood loss anemia.  Follow-up CBC--history of heme positive stools.  Continue iron supplement 9.  PAF.  Cardiac rate controlled.  Continue Eliquis.  Follow-up cardiology services as needed 10.  Diastolic congestive heart failure.  Monitor weight daily--have been inconsistent. Will continue Lasix 20 mg daily--s/p IV lasix 20 mg 11/24. Was on 40 mg bid PTA due to recent decompensation.  Will recheck CXR and may need to  resume due to progressive pleural effusions.   11/26- on Lasix 20 mg daily- will con't and monitor daily weights  11/28- Weight wasn't checked daily, but increased 96 to 97.3 kg in last 3 days- BUN up to 27, so don't want to increase Lasix currently- labs Monday   11/29- weight back down to 96.7kg  11/30: weight decreased to 94.5 kg.  121- weight 94.3 kg  12/2- 93/9kg   Filed Weights   12/14/18 1448 12/15/18 0500 12/16/18 0500  Weight: 95.1 kg 94.3 kg 93.9 kg   11.  Hypertension.  Norvasc 10 mg daily.  Monitor with increased  mobility 12.  Diabetes mellitus with peripheral neuropathy.  Latest hemoglobin A1c 7.0.  SSI.  Patient on glipizide hold. CBG (last 3)  Recent Labs    12/15/18 1710 12/15/18 2125 12/16/18 0613  GLUCAP 129* 152* 128*    11/29- BGs adequate control- 1 high BG  12/2: BG well controlled.  13.  Hypothyroidism. On Synthroid for supplement 14.  BPH: Monitor voiding with PVR checks. Continue Proscar 5 mg daily, Flomax 0.4 mg twice daily.   15.  Hyperlipidemia.  Lipitor  16. Leucocytosis/?PNA: Monitor cough--on Ceftriaxone/Azithormycin D#2  11/26- on Rocephin for CAP 1 g q24 hours currently- WBC down to 11.1k from 12.7k.  11/27- will recheck labs in AM  11/28- WBC down to 10.6k- trending in right direction- if increases, recheck CXR  11/30: Resolved 17. CKD stage III: Will monitor renal status with serial checks.   11/26- Cr up slightly to 1.48  11/27- will recheck labs in AM  11/28- Cr 1.41- slightly less, but BUN up to 27 from 21- will enocourage PO/fluid intake  12/1- Cr stable at 1.4 and BUN still 27- likely due to IV Lasix Sunday 18. Hyponatremia  11/26- Na 131 up from 130.  11/28- Na 129-will recheck Monday- is a little low, but basically stable.   11/30: Na Improved to 132 19. Urinary retention with BPH  11/28- needed in/out cath last night for retention- on Proscar AND Flomax 0.4 mg BID - cannot increase- will check PVRs and cath if needed.   12/2- will  need f/u with Outpatient Urology- son will talk to siblings and see if they want to in/ou cath pt or do foley- he has had 2 UTIs in last 3 months since last CIR d/c- so likely was retaining last few months. 47. dISPO  12/2-called son and spent 25 minutes on phone- in addition to rounds, spent 35 minutes on direct pt care today.    LOS: 7 days A FACE TO FACE EVALUATION WAS PERFORMED  [name] 12/16/2018, 8:42 AM

## 2018-12-17 ENCOUNTER — Inpatient Hospital Stay (HOSPITAL_COMMUNITY): Payer: Medicare Other

## 2018-12-17 ENCOUNTER — Other Ambulatory Visit: Payer: Self-pay | Admitting: Physician Assistant

## 2018-12-17 LAB — BASIC METABOLIC PANEL
Anion gap: 10 (ref 5–15)
BUN: 25 mg/dL — ABNORMAL HIGH (ref 8–23)
CO2: 27 mmol/L (ref 22–32)
Calcium: 8.9 mg/dL (ref 8.9–10.3)
Chloride: 97 mmol/L — ABNORMAL LOW (ref 98–111)
Creatinine, Ser: 1.46 mg/dL — ABNORMAL HIGH (ref 0.61–1.24)
GFR calc Af Amer: 49 mL/min — ABNORMAL LOW (ref >60)
GFR calc non Af Amer: 42 mL/min — ABNORMAL LOW (ref >60)
Glucose, Bld: 131 mg/dL — ABNORMAL HIGH (ref 70–99)
Potassium: 3.9 mmol/L (ref 3.5–5.1)
Sodium: 134 mmol/L — ABNORMAL LOW (ref 135–145)

## 2018-12-17 LAB — URINALYSIS, ROUTINE W REFLEX MICROSCOPIC
Bilirubin Urine: NEGATIVE
Glucose, UA: NEGATIVE mg/dL
Hgb urine dipstick: NEGATIVE
Ketones, ur: NEGATIVE mg/dL
Leukocytes,Ua: NEGATIVE
Nitrite: NEGATIVE
Protein, ur: NEGATIVE mg/dL
Specific Gravity, Urine: 1.011 (ref 1.005–1.030)
pH: 8 (ref 5.0–8.0)

## 2018-12-17 LAB — CBC WITH DIFFERENTIAL/PLATELET
Abs Immature Granulocytes: 0.03 10*3/uL (ref 0.00–0.07)
Basophils Absolute: 0.1 10*3/uL (ref 0.0–0.1)
Basophils Relative: 1 %
Eosinophils Absolute: 0.6 10*3/uL — ABNORMAL HIGH (ref 0.0–0.5)
Eosinophils Relative: 7 %
HCT: 29.3 % — ABNORMAL LOW (ref 39.0–52.0)
Hemoglobin: 9.8 g/dL — ABNORMAL LOW (ref 13.0–17.0)
Immature Granulocytes: 0 %
Lymphocytes Relative: 33 %
Lymphs Abs: 2.9 10*3/uL (ref 0.7–4.0)
MCH: 30.3 pg (ref 26.0–34.0)
MCHC: 33.4 g/dL (ref 30.0–36.0)
MCV: 90.7 fL (ref 80.0–100.0)
Monocytes Absolute: 0.7 10*3/uL (ref 0.1–1.0)
Monocytes Relative: 8 %
Neutro Abs: 4.5 10*3/uL (ref 1.7–7.7)
Neutrophils Relative %: 51 %
Platelets: 351 10*3/uL (ref 150–400)
RBC: 3.23 MIL/uL — ABNORMAL LOW (ref 4.22–5.81)
RDW: 15.3 % (ref 11.5–15.5)
WBC: 8.7 10*3/uL (ref 4.0–10.5)
nRBC: 0 % (ref 0.0–0.2)

## 2018-12-17 LAB — GLUCOSE, CAPILLARY
Glucose-Capillary: 114 mg/dL — ABNORMAL HIGH (ref 70–99)
Glucose-Capillary: 158 mg/dL — ABNORMAL HIGH (ref 70–99)
Glucose-Capillary: 133 mg/dL — ABNORMAL HIGH (ref 70–99)

## 2018-12-17 NOTE — Progress Notes (Addendum)
Physical Therapy Session Note  Patient Details  Name: [name] MRN: [id number] Date of Birth: [birth year]  Today's Date: 12/17/2018 PT Individual Time: 0800-0855; 1530-1600 PT Individual Time Calculation (min): 55 min , 30 min  Short Term Goals: Week 1:  PT Short Term Goal 1 (Week 1): Pt will be able to demonstrate functional dynamic sitting balance with min assist PT Short Term Goal 2 (Week 1): Pt will be able to perform bed <> chair transfers with max assist  Skilled Therapeutic Interventions/Progress Updates:  tx 1:  Pt propped in bed, finishing breakfast.  He denied pain. R residual limb shrinker in place. Dr. Dagoberto Johnston briefly discussed his pending L BKA scheduled for tomorrow.    PT started shorts over pt's LEs, and he rolled using bed features and min assist, and pulled them up over hips in the front.  PT assisted with shorts in the back.  Therapeutic exercises performed with LEs to increase strength for functional mobility: in supine- 10 x 1 bil adductor squeezes, R/L quad sets, R/L hip extension with towel roll under thigh.  Supine> sit with max assist, max cues, in nearly flat bed, using rail..  Pt has poor motor plan for sitting up.  He stated that his son lifts him up from the bed.  Sitting balance with supervision as pt doffed gown and donned shirt.  PT applied liner and R prosthesis with pt assisting slightly by pushing down to hear "more clicks".   Sit> stand in [place] with max assist and cues to shift R, avoiding wt bearing LLE, then to w/c with +2 . Pt stated that he didn't have pain, just "no strength".    Travis Smolder, RN entered room and asked that pt be transferred back to bed for cathing.  Slide board transfer to R, with max assist plus guarding by RN.  Sit> supine in min assist.    PT doffed prosthesis and liner and donned shrinker. Pt in care of [name] at end of session.   tx 2:  Pt asleep in bed.  He awakened to PT speaking and touching his arm.  Son [name]  present.  He reported that his father's bed mobility declined as he gained water weight at home, since last admission.  (family used a lift at home, so pt may not have been required to move sit>< supine).  Education for [employer]: observing bed mobility for rolling R and sit>< supine.  Training pt via: partial R side lying (with towel roll behind back)>< R side lying, x 5; initially mod assist fading to close supervision and cues.  R side lying> sitting with max assist in flat bed, no rails, max cues. Pt has great difficulty getting R elbow under him to push up trunk.  R/L lateral leans x 10 each.   Sittting balance activities, with LLE off of floor: R/L trunk rotation x 5 each, trunk extension/flexion x 10 with close supervision, after 1 LOB backwards initially requiring min assist to regain.  Sit> supine with min assist, with poor control of trunk.  At end of session, alarm set and  needs at hand; son [name] present.     Therapy Documentation Precautions:  Precautions Precautions: Fall Precaution Comments: preexisting R BKA; keep left Le elevated Restrictions Weight Bearing Restrictions: Yes LLE Weight Bearing: Non weight bearing          Therapy/Group: Individual Therapy  [name] 12/17/2018, 10:55 AM

## 2018-12-17 NOTE — Progress Notes (Signed)
Pt has darken (blue-ish/red) to the left side of his scrotum. Pt denied pain during palpation and was unable to feel any touch to the area. Provider will be notified for further assessment.

## 2018-12-17 NOTE — Progress Notes (Signed)
Occupational Therapy Session Note  Patient Details  Name: [name] MRN: [id number] Date of Birth: 02-06-1927  Today's Date: 12/17/2018 OT Individual Time: 1015-1110 OT Individual Time Calculation (min): 55 min    Short Term Goals: Week 1:     Skilled Therapeutic Interventions/Progress Updates:    Pt asleep in bed upon arrival but easily aroused.  Pt's son present.  Pt initially tearful regarding pending L BKA. Tissue and emotional support provided. OT intervention with focus on bed moblity, BUE therex (see below), and education.  Pt going to surgery tomorrow for L BKA.  Pt performed 3 sets of exercises per below in addition to punches with yellow theraband and trunk rotation with 1kg ball. Pt's son shared picture of hoyer lift pt will have access to upon discharge. Pt remained in bed with all needs within reach and bed alarm activated.   Therapy Documentation Precautions:  Precautions Precautions: Fall Precaution Comments: preexisting R BKA; keep left Le elevated Restrictions Weight Bearing Restrictions: Yes LLE Weight Bearing: Non weight bearing   Pain:  Pt denies pain this morning Exercises: Trunk/Core Exercises Crunches: Supine;Without weight;Other (comment)(half crunches) General Exercises - Upper Extremity Shoulder Flexion: Strengthening;Both;10 reps;Supine;Bar weights/barbell Bar Weights/Barbell (Shoulder Flexion): 3 lbs Shoulder Extension: Strengthening;Both;10 reps;Supine;Bar weights/barbell Bar Weights/Barbell (Shoulder Extension): 3 lbs Other Treatments:     Therapy/Group: Individual Therapy  [name] 12/17/2018, 12:16 PM

## 2018-12-17 NOTE — Progress Notes (Signed)
[place] PHYSICAL MEDICINE & REHABILITATION PROGRESS NOTE   Subjective/Complaints:  Pt reports "not having a lot of pain" but forgets to ask for tylenol. Was scheduled by PA.  Per PT, working on sitting balance with pt- surgery for BKA scheduled for Friday.   ROS- denies SOB, CP, N/V/D/C; pain is controlled usually  Objective:   No results found. Recent Labs    12/17/18 0646  WBC 8.7  HGB 9.8*  HCT 29.3*  PLT 351   Recent Labs    12/17/18 0646  NA 134*  K 3.9  CL 97*  CO2 27  GLUCOSE 131*  BUN 25*  CREATININE 1.46*  CALCIUM 8.9    Intake/Output Summary (Last 24 hours) at 12/17/2018 0942 Last data filed at 12/17/2018 0820 Gross per 24 hour  Intake 360 ml  Output 2064 ml  Net -1704 ml     Physical Exam: Vital Signs Blood pressure 132/69, pulse 63, temperature 98.1 F (36.7 C), temperature source Oral, resp. rate 16, height 5\' 11"  (1.803 m), weight 93.9 kg, SpO2 94 %.  Physical Exam  Gen:in pain, NAD, just woke up this AM- doesn't even have eyeglasses on, appears comfortable; sitting up in bed; PT in room.  HEENT: oral mucosa pink and moist, NCAT Cardio: Regular rate- has holosystolic murmur;  Chest: normal effort, normal rate of breathing; CTA B/L- adequate air movement B/L Abd: soft, non-distended; NT, ND, (+)BS Ext: no edema of LEs Skin: Right BKA well-healed.  Left transmetatarsal amputation site dressed with ACE wrap in place on L TMA.  Appropriately tender.     Neuro: pt is vague and slow to process, but remembers examiner.  Musculoskeletal: 4/5 in upper extremities bilaterally. Patient is unable to participate in lower extremity testing  Psych: pleasant, normal affect; appears appropriate    Assessment/Plan: 1. Functional deficits secondary to L TMA in setting of R older BKA which require 3+ hours per day of interdisciplinary therapy in a comprehensive inpatient rehab setting.  Physiatrist is providing close team supervision and 24 hour  management of active medical problems listed below.  Physiatrist and rehab team continue to assess barriers to discharge/monitor patient progress toward functional and medical goals  Care Tool:  Bathing    Body parts bathed by patient: Right arm, Left arm, Chest, Abdomen, Face, Left upper leg, Right upper leg   Body parts bathed by helper: Buttocks, Left lower leg Body parts n/a: Right lower leg, Front perineal area   Bathing assist Assist Level: Maximal Assistance - Patient 24 - 49%     Upper Body Dressing/Undressing Upper body dressing   What is the patient wearing?: Pull over shirt    Upper body assist Assist Level: Maximal Assistance - Patient 25 - 49%    Lower Body Dressing/Undressing Lower body dressing      What is the patient wearing?: Incontinence brief     Lower body assist Assist for lower body dressing: Total Assistance - Patient < 25%     Toileting Toileting    Toileting assist Assist for toileting: Total Assistance - Patient < 25%     Transfers Chair/bed transfer  Transfers assist  Chair/bed transfer activity did not occur: Safety/medical concerns  Chair/bed transfer assist level: 2 Helpers     Locomotion Ambulation   Ambulation assist   Ambulation activity did not occur: Safety/medical concerns(NWB LLE and R BKA)          Walk 10 feet activity   Assist  Walk 10 feet activity did not  occur: Safety/medical concerns        Walk 50 feet activity   Assist Walk 50 feet with 2 turns activity did not occur: Safety/medical concerns         Walk 150 feet activity   Assist Walk 150 feet activity did not occur: Safety/medical concerns         Walk 10 feet on uneven surface  activity   Assist Walk 10 feet on uneven surfaces activity did not occur: Safety/medical concerns         Wheelchair     Assist Will patient use wheelchair at discharge?: Yes Type of Wheelchair: Manual    Wheelchair assist level:  Supervision/Verbal cueing Max wheelchair distance: 3'    Wheelchair 50 feet with 2 turns activity    Assist        Assist Level: Supervision/Verbal cueing   Wheelchair 150 feet activity     Assist  Wheelchair 150 feet activity did not occur: Safety/medical concerns(endurance)       Blood pressure 132/69, pulse 63, temperature 98.1 F (36.7 C), temperature source Oral, resp. rate 16, height 5\' 11"  (1.803 m), weight 93.9 kg, SpO2 94 %.    Medical Problem List and Plan: 1.  Decreased functional mobility secondary to left transmetatarsal amputation 12/02/2018 with wound VAC applied as well as history of right BKA August 2020             -Will consult with surgical team to determine when patient is cleared to shower.   11/26- didn't see wound VAC that was mentioned; will d/w nursing- maybe under dressing?   11/28- wound VAC is off  12/2- will call Vascular because wound is not looking good.  12/3- going back to OR for BKA of LLE Friday- will make NPO- he's an add-on. Will hold lovenox/eliquis for OR.             -ELOS/Goals: MinA in PT and ADLs, independent in SLP             Sleeping well, pain controlled, moving bowels. 2.  Antithrombotics: -DVT/anticoagulation: Eliquis             -antiplatelet therapy: Plavix 75 mg daily 3. Pain Management: Ultram as needed.   11/26- pain meds work "sometimes" per pt- pt on Tylenol 650 mg q4 hours prn; tramadol 50 mg q6 hours prn; or oxycodone q4 hours prn. 4. Mood: Provide emotional support             -antipsychotic agents: N/A 5. Neuropsych: This patient is capable of making decisions on his own behalf. 6. Skin/Wound Care: Routine skin checks 7. Fluids/Electrolytes/Nutrition:  Monitor I/O. Check lytes in am.  8.  Acute blood loss anemia.  Follow-up CBC--history of heme positive stools.  Continue iron supplement 9.  PAF.  Cardiac rate controlled.  Continue Eliquis.  Follow-up cardiology services as needed 10.  Diastolic congestive  heart failure.  Monitor weight daily--have been inconsistent. Will continue Lasix 20 mg daily--s/p IV lasix 20 mg 11/24. Was on 40 mg bid PTA due to recent decompensation.  Will recheck CXR and may need to resume due to progressive pleural effusions.   11/26- on Lasix 20 mg daily- will con't and monitor daily weights  11/28- Weight wasn't checked daily, but increased 96 to 97.3 kg in last 3 days- BUN up to 27, so don't want to increase Lasix currently- labs Monday   11/29- weight back down to 96.7kg  11/30: weight decreased to 94.5 kg.  121-  weight 94.3 kg  12/2- 93/9kg   Filed Weights   12/14/18 1448 12/15/18 0500 12/16/18 0500  Weight: 95.1 kg 94.3 kg 93.9 kg   11.  Hypertension.  Norvasc 10 mg daily.  Monitor with increased mobility 12.  Diabetes mellitus with peripheral neuropathy.  Latest hemoglobin A1c 7.0.  SSI.  Patient on glipizide hold. CBG (last 3)  Recent Labs    12/16/18 1648 12/16/18 2113 12/17/18 0618  GLUCAP 116* 152* 114*    11/29- BGs adequate control- 1 high BG  12/3 BG well controlled.  13.  Hypothyroidism. On Synthroid for supplement 14.  BPH: Monitor voiding with PVR checks. Continue Proscar 5 mg daily, Flomax 0.4 mg twice daily.   15.  Hyperlipidemia.  Lipitor  16. Leucocytosis/?PNA: Monitor cough--on Ceftriaxone/Azithormycin D#2  11/26- on Rocephin for CAP 1 g q24 hours currently- WBC down to 11.1k from 12.7k.  11/27- will recheck labs in AM  11/28- WBC down to 10.6k- trending in right direction- if increases, recheck CXR  11/30: Resolved 17. CKD stage III: Will monitor renal status with serial checks.   11/26- Cr up slightly to 1.48  11/27- will recheck labs in AM  11/28- Cr 1.41- slightly less, but BUN up to 27 from 21- will enocourage PO/fluid intake  12/1- Cr stable at 1.4 and BUN still 27- likely due to IV Lasix Sunday 18. Hyponatremia  11/26- Na 131 up from 130.  11/28- Na 129-will recheck Monday- is a little low, but basically stable.   11/30: Na  Improved to 132 19. Urinary retention with BPH  11/28- needed in/out cath last night for retention- on Proscar AND Flomax 0.4 mg BID - cannot increase- will check PVRs and cath if needed.   12/2- will need f/u with Outpatient Urology- son will talk to siblings and see if they want to in/ou cath pt or do foley- he has had 2 UTIs in last 3 months since last CIR d/c- so likely was retaining last few months. 34. dISPO  12/2-called son and spent 25 minutes on phone- in addition to rounds, spent 35 minutes on direct pt care today.    LOS: 8 days A FACE TO FACE EVALUATION WAS PERFORMED  Malva Diesing 12/17/2018, 9:42 AM

## 2018-12-17 NOTE — Progress Notes (Signed)
Occupational Therapy Session Note  Patient Details  Name: [name] MRN: [id number] Date of Birth: 1927/12/15  Today's Date: 12/17/2018 OT Individual Time: 1315-1345 OT Individual Time Calculation (min): 30 min  and Today's Date: 12/17/2018 OT Missed Time: 15 Minutes Missed Time Reason: Other (comment)(eating lunch)   Short Term Goals: Week 1:     Skilled Therapeutic Interventions/Progress Updates:    Pt in bed upon arrival eating lunch with son present.  Pt missed 15 mins skilled OT services.  Upon return pt ready for therapy.  Pt engaged in long sitting tasks in bed reaching with alternating BUE to facilitate trunk flexion and increase AROM at hips.  Pt initially stated that he was stiff but commented that he felt like he "loosened up" after activities. Pt became tearful X 2 during session when contemplating pending surgery for L BKA. Tissue and emotional support provided.  Pt remained in bed with all needs within reach, bed alarm activated, and son present.   Therapy Documentation Precautions:  Precautions Precautions: Fall Precaution Comments: preexisting R BKA; keep left Le elevated Restrictions Weight Bearing Restrictions: Yes LLE Weight Bearing: Non weight bearing General: General OT Amount of Missed Time: 15 Minutes Pain:  Pt denies pain this afternoon   Therapy/Group: Individual Therapy  [name] 12/17/2018, 2:19 PM

## 2018-12-18 ENCOUNTER — Other Ambulatory Visit: Payer: Self-pay

## 2018-12-18 ENCOUNTER — Encounter (HOSPITAL_COMMUNITY): Payer: Self-pay | Admitting: *Deleted

## 2018-12-18 ENCOUNTER — Inpatient Hospital Stay (HOSPITAL_COMMUNITY): Payer: Medicare Other | Admitting: Certified Registered"

## 2018-12-18 ENCOUNTER — Inpatient Hospital Stay (HOSPITAL_COMMUNITY): Payer: Medicare Other | Admitting: Occupational Therapy

## 2018-12-18 ENCOUNTER — Inpatient Hospital Stay (HOSPITAL_COMMUNITY)
Admission: RE | Admit: 2018-12-18 | Discharge: 2018-12-25 | DRG: 475 | Disposition: A | Discharge status: Rehabilitation Facility | Payer: Medicare Other | Attending: Orthopedic Surgery | Admitting: Orthopedic Surgery

## 2018-12-18 ENCOUNTER — Encounter (HOSPITAL_COMMUNITY)
Admission: RE | Disposition: A | Discharge status: Rehabilitation Facility | Payer: Self-pay | Source: Home / Self Care | Attending: Orthopedic Surgery

## 2018-12-18 DIAGNOSIS — Z7901 Long term (current) use of anticoagulants: Secondary | ICD-10-CM

## 2018-12-18 DIAGNOSIS — I130 Hypertensive heart and chronic kidney disease with heart failure and stage 1 through stage 4 chronic kidney disease, or unspecified chronic kidney disease: Secondary | ICD-10-CM | POA: Diagnosis not present

## 2018-12-18 DIAGNOSIS — G4730 Sleep apnea, unspecified: Secondary | ICD-10-CM | POA: Diagnosis not present

## 2018-12-18 DIAGNOSIS — E1151 Type 2 diabetes mellitus with diabetic peripheral angiopathy without gangrene: Secondary | ICD-10-CM | POA: Diagnosis not present

## 2018-12-18 DIAGNOSIS — R8281 Pyuria: Secondary | ICD-10-CM | POA: Diagnosis not present

## 2018-12-18 DIAGNOSIS — Z89432 Acquired absence of left foot: Secondary | ICD-10-CM | POA: Diagnosis not present

## 2018-12-18 DIAGNOSIS — E785 Hyperlipidemia, unspecified: Secondary | ICD-10-CM | POA: Diagnosis present

## 2018-12-18 DIAGNOSIS — I5032 Chronic diastolic (congestive) heart failure: Secondary | ICD-10-CM | POA: Diagnosis not present

## 2018-12-18 DIAGNOSIS — H409 Unspecified glaucoma: Secondary | ICD-10-CM | POA: Diagnosis not present

## 2018-12-18 DIAGNOSIS — Z89511 Acquired absence of right leg below knee: Secondary | ICD-10-CM | POA: Diagnosis not present

## 2018-12-18 DIAGNOSIS — E039 Hypothyroidism, unspecified: Secondary | ICD-10-CM | POA: Diagnosis not present

## 2018-12-18 DIAGNOSIS — K5900 Constipation, unspecified: Secondary | ICD-10-CM | POA: Diagnosis not present

## 2018-12-18 DIAGNOSIS — I110 Hypertensive heart disease with heart failure: Secondary | ICD-10-CM | POA: Diagnosis present

## 2018-12-18 DIAGNOSIS — Z20828 Contact with and (suspected) exposure to other viral communicable diseases: Secondary | ICD-10-CM | POA: Diagnosis present

## 2018-12-18 DIAGNOSIS — Z87891 Personal history of nicotine dependence: Secondary | ICD-10-CM | POA: Diagnosis not present

## 2018-12-18 DIAGNOSIS — N183 Chronic kidney disease, stage 3 unspecified: Secondary | ICD-10-CM | POA: Diagnosis not present

## 2018-12-18 DIAGNOSIS — Z7902 Long term (current) use of antithrombotics/antiplatelets: Secondary | ICD-10-CM | POA: Diagnosis not present

## 2018-12-18 DIAGNOSIS — R338 Other retention of urine: Secondary | ICD-10-CM | POA: Diagnosis present

## 2018-12-18 DIAGNOSIS — N401 Enlarged prostate with lower urinary tract symptoms: Secondary | ICD-10-CM | POA: Diagnosis present

## 2018-12-18 DIAGNOSIS — Z7982 Long term (current) use of aspirin: Secondary | ICD-10-CM | POA: Diagnosis not present

## 2018-12-18 DIAGNOSIS — Z89512 Acquired absence of left leg below knee: Secondary | ICD-10-CM | POA: Diagnosis not present

## 2018-12-18 DIAGNOSIS — Z79891 Long term (current) use of opiate analgesic: Secondary | ICD-10-CM

## 2018-12-18 DIAGNOSIS — I480 Paroxysmal atrial fibrillation: Secondary | ICD-10-CM | POA: Diagnosis present

## 2018-12-18 DIAGNOSIS — Z87440 Personal history of urinary (tract) infections: Secondary | ICD-10-CM

## 2018-12-18 DIAGNOSIS — D62 Acute posthemorrhagic anemia: Secondary | ICD-10-CM | POA: Diagnosis not present

## 2018-12-18 DIAGNOSIS — M79672 Pain in left foot: Secondary | ICD-10-CM | POA: Diagnosis not present

## 2018-12-18 DIAGNOSIS — Z79890 Hormone replacement therapy (postmenopausal): Secondary | ICD-10-CM | POA: Diagnosis not present

## 2018-12-18 DIAGNOSIS — I96 Gangrene, not elsewhere classified: Secondary | ICD-10-CM | POA: Diagnosis not present

## 2018-12-18 DIAGNOSIS — Z79899 Other long term (current) drug therapy: Secondary | ICD-10-CM | POA: Diagnosis not present

## 2018-12-18 DIAGNOSIS — T8754 Necrosis of amputation stump, left lower extremity: Secondary | ICD-10-CM | POA: Diagnosis not present

## 2018-12-18 DIAGNOSIS — R310 Gross hematuria: Secondary | ICD-10-CM | POA: Diagnosis not present

## 2018-12-18 DIAGNOSIS — Z7984 Long term (current) use of oral hypoglycemic drugs: Secondary | ICD-10-CM | POA: Diagnosis not present

## 2018-12-18 DIAGNOSIS — Z4781 Encounter for orthopedic aftercare following surgical amputation: Secondary | ICD-10-CM | POA: Diagnosis not present

## 2018-12-18 DIAGNOSIS — Z91041 Radiographic dye allergy status: Secondary | ICD-10-CM

## 2018-12-18 DIAGNOSIS — Y835 Amputation of limb(s) as the cause of abnormal reaction of the patient, or of later complication, without mention of misadventure at the time of the procedure: Secondary | ICD-10-CM | POA: Diagnosis present

## 2018-12-18 DIAGNOSIS — Z993 Dependence on wheelchair: Secondary | ICD-10-CM

## 2018-12-18 DIAGNOSIS — E1152 Type 2 diabetes mellitus with diabetic peripheral angiopathy with gangrene: Secondary | ICD-10-CM | POA: Diagnosis not present

## 2018-12-18 DIAGNOSIS — K219 Gastro-esophageal reflux disease without esophagitis: Secondary | ICD-10-CM | POA: Diagnosis present

## 2018-12-18 DIAGNOSIS — R339 Retention of urine, unspecified: Secondary | ICD-10-CM | POA: Diagnosis present

## 2018-12-18 DIAGNOSIS — Z823 Family history of stroke: Secondary | ICD-10-CM | POA: Diagnosis not present

## 2018-12-18 DIAGNOSIS — Z23 Encounter for immunization: Secondary | ICD-10-CM | POA: Diagnosis not present

## 2018-12-18 DIAGNOSIS — Z91048 Other nonmedicinal substance allergy status: Secondary | ICD-10-CM | POA: Diagnosis not present

## 2018-12-18 DIAGNOSIS — M25531 Pain in right wrist: Secondary | ICD-10-CM | POA: Diagnosis not present

## 2018-12-18 DIAGNOSIS — T8781 Dehiscence of amputation stump: Secondary | ICD-10-CM

## 2018-12-18 DIAGNOSIS — R001 Bradycardia, unspecified: Secondary | ICD-10-CM | POA: Diagnosis not present

## 2018-12-18 DIAGNOSIS — Z8249 Family history of ischemic heart disease and other diseases of the circulatory system: Secondary | ICD-10-CM | POA: Diagnosis not present

## 2018-12-18 HISTORY — PX: BELOW KNEE LEG AMPUTATION: SUR23

## 2018-12-18 HISTORY — PX: AMPUTATION: SHX166

## 2018-12-18 LAB — URINE CULTURE: Culture: NO GROWTH

## 2018-12-18 LAB — GLUCOSE, CAPILLARY
Glucose-Capillary: 116 mg/dL — ABNORMAL HIGH (ref 70–99)
Glucose-Capillary: 123 mg/dL — ABNORMAL HIGH (ref 70–99)
Glucose-Capillary: 142 mg/dL — ABNORMAL HIGH (ref 70–99)
Glucose-Capillary: 149 mg/dL — ABNORMAL HIGH (ref 70–99)
Glucose-Capillary: 148 mg/dL — ABNORMAL HIGH (ref 70–99)
Glucose-Capillary: 162 mg/dL — ABNORMAL HIGH (ref 70–99)

## 2018-12-18 SURGERY — AMPUTATION BELOW KNEE
Anesthesia: Monitor Anesthesia Care | Site: Knee | Laterality: Left

## 2018-12-18 MED ORDER — 0.9 % SODIUM CHLORIDE (POUR BTL) OPTIME
TOPICAL | Status: DC | PRN
  Administered 2018-12-18: 1000 mL

## 2018-12-18 MED ORDER — FENTANYL CITRATE (PF) 100 MCG/2ML IJ SOLN: 25.0000 ug | INTRAMUSCULAR | Status: DC | PRN

## 2018-12-18 MED ORDER — METOCLOPRAMIDE HCL 5 MG/ML IJ SOLN: 5.0000 mg | Freq: Three times a day (TID) | INTRAMUSCULAR | Status: DC | PRN

## 2018-12-18 MED ORDER — ACETAMINOPHEN 325 MG PO TABS
325.0000 mg | ORAL_TABLET | Freq: Four times a day (QID) | ORAL | Status: DC | PRN
  Administered 2018-12-19 – 2018-12-23 (×11): 650 mg via ORAL
  Administered 2018-12-23: 325 mg via ORAL
  Administered 2018-12-24 – 2018-12-25 (×3): 650 mg via ORAL
  Filled 2018-12-18 (×15): qty 2

## 2018-12-18 MED ORDER — POLYETHYLENE GLYCOL 3350 17 G PO PACK
17.0000 g | PACK | Freq: Every day | ORAL | Status: DC | PRN
  Filled 2018-12-18 (×2): qty 1

## 2018-12-18 MED ORDER — BUPIVACAINE LIPOSOME 1.3 % IJ SUSP
INTRAMUSCULAR | Status: DC | PRN
  Administered 2018-12-18: 10 mL

## 2018-12-18 MED ORDER — OXYCODONE HCL 5 MG PO TABS: 5.0000 mg | ORAL_TABLET | Freq: Once | ORAL | Status: DC | PRN

## 2018-12-18 MED ORDER — ONDANSETRON HCL 4 MG/2ML IJ SOLN
INTRAMUSCULAR | Status: DC | PRN
  Administered 2018-12-18: 4 mg via INTRAVENOUS

## 2018-12-18 MED ORDER — POLYSACCHARIDE IRON COMPLEX 150 MG PO CAPS
150.0000 mg | ORAL_CAPSULE | Freq: Every day | ORAL | Status: DC
  Administered 2018-12-19 – 2018-12-25 (×7): 150 mg via ORAL
  Filled 2018-12-18 (×7): qty 1

## 2018-12-18 MED ORDER — LEVOTHYROXINE SODIUM 50 MCG PO TABS
50.0000 ug | ORAL_TABLET | Freq: Every day | ORAL | Status: DC
  Administered 2018-12-19 – 2018-12-25 (×7): 50 ug via ORAL
  Filled 2018-12-18 (×7): qty 1

## 2018-12-18 MED ORDER — FINASTERIDE 5 MG PO TABS
5.0000 mg | ORAL_TABLET | Freq: Every evening | ORAL | Status: DC
  Administered 2018-12-18 – 2018-12-24 (×7): 5 mg via ORAL
  Filled 2018-12-18 (×7): qty 1

## 2018-12-18 MED ORDER — CEFAZOLIN SODIUM-DEXTROSE 2-4 GM/100ML-% IV SOLN
2.0000 g | INTRAVENOUS | Status: AC
  Administered 2018-12-18: 2 g via INTRAVENOUS

## 2018-12-18 MED ORDER — OXYCODONE HCL 5 MG PO TABS
5.0000 mg | ORAL_TABLET | ORAL | Status: DC | PRN
  Administered 2018-12-18: 5 mg via ORAL
  Filled 2018-12-18: qty 2
  Filled 2018-12-18: qty 1

## 2018-12-18 MED ORDER — VITAMIN C 500 MG PO TABS
500.0000 mg | ORAL_TABLET | Freq: Every day | ORAL | Status: DC
  Administered 2018-12-19 – 2018-12-25 (×7): 500 mg via ORAL
  Filled 2018-12-18 (×7): qty 1

## 2018-12-18 MED ORDER — PROPOFOL 500 MG/50ML IV EMUL
INTRAVENOUS | Status: DC | PRN
  Administered 2018-12-18: 50 ug/kg/min via INTRAVENOUS

## 2018-12-18 MED ORDER — FENTANYL CITRATE (PF) 100 MCG/2ML IJ SOLN
50.0000 ug | Freq: Once | INTRAMUSCULAR | Status: AC
  Administered 2018-12-18: 13:00:00 50 ug via INTRAVENOUS

## 2018-12-18 MED ORDER — OXYCODONE HCL 5 MG/5ML PO SOLN: 5.0000 mg | Freq: Once | ORAL | Status: DC | PRN

## 2018-12-18 MED ORDER — POTASSIUM CHLORIDE CRYS ER 10 MEQ PO TBCR
10.0000 meq | EXTENDED_RELEASE_TABLET | Freq: Every day | ORAL | Status: DC
  Administered 2018-12-19 – 2018-12-25 (×7): 10 meq via ORAL
  Filled 2018-12-18 (×7): qty 1

## 2018-12-18 MED ORDER — MIDAZOLAM HCL 2 MG/2ML IJ SOLN
INTRAMUSCULAR | Status: AC
  Filled 2018-12-18: qty 2

## 2018-12-18 MED ORDER — DORZOLAMIDE HCL-TIMOLOL MAL 2-0.5 % OP SOLN
1.0000 [drp] | Freq: Two times a day (BID) | OPHTHALMIC | Status: DC
  Administered 2018-12-18 – 2018-12-25 (×14): 1 [drp] via OPHTHALMIC
  Filled 2018-12-18: qty 10

## 2018-12-18 MED ORDER — VITAMIN B-12 1000 MCG PO TABS
1000.0000 ug | ORAL_TABLET | Freq: Every day | ORAL | Status: DC
  Administered 2018-12-19 – 2018-12-25 (×7): 1000 ug via ORAL
  Filled 2018-12-18 (×7): qty 1

## 2018-12-18 MED ORDER — FENTANYL CITRATE (PF) 100 MCG/2ML IJ SOLN
INTRAMUSCULAR | Status: AC
  Administered 2018-12-18: 50 ug via INTRAVENOUS
  Filled 2018-12-18: qty 2

## 2018-12-18 MED ORDER — CHLORHEXIDINE GLUCONATE 4 % EX LIQD: 60.0000 mL | Freq: Once | CUTANEOUS | Status: DC

## 2018-12-18 MED ORDER — FUROSEMIDE 40 MG PO TABS
40.0000 mg | ORAL_TABLET | Freq: Every day | ORAL | Status: DC
  Administered 2018-12-19 – 2018-12-24 (×6): 40 mg via ORAL
  Filled 2018-12-18 (×7): qty 1

## 2018-12-18 MED ORDER — APIXABAN 5 MG PO TABS
5.0000 mg | ORAL_TABLET | Freq: Two times a day (BID) | ORAL | Status: DC
  Administered 2018-12-19 – 2018-12-25 (×13): 5 mg via ORAL
  Filled 2018-12-18 (×13): qty 1

## 2018-12-18 MED ORDER — INSULIN ASPART 100 UNIT/ML ~~LOC~~ SOLN
0.0000 [IU] | Freq: Three times a day (TID) | SUBCUTANEOUS | Status: DC
  Administered 2018-12-18 – 2018-12-19 (×3): 2 [IU] via SUBCUTANEOUS
  Administered 2018-12-20 (×2): 3 [IU] via SUBCUTANEOUS
  Administered 2018-12-21: 2 [IU] via SUBCUTANEOUS
  Administered 2018-12-21: 3 [IU] via SUBCUTANEOUS
  Administered 2018-12-22 – 2018-12-25 (×3): 2 [IU] via SUBCUTANEOUS

## 2018-12-18 MED ORDER — ASPIRIN EC 81 MG PO TBEC
81.0000 mg | DELAYED_RELEASE_TABLET | Freq: Every day | ORAL | Status: DC
  Administered 2018-12-19 – 2018-12-25 (×7): 81 mg via ORAL
  Filled 2018-12-18 (×7): qty 1

## 2018-12-18 MED ORDER — ONDANSETRON HCL 4 MG/2ML IJ SOLN: 4.0000 mg | Freq: Once | INTRAMUSCULAR | Status: DC | PRN

## 2018-12-18 MED ORDER — BUPIVACAINE HCL (PF) 0.5 % IJ SOLN
INTRAMUSCULAR | Status: DC | PRN
  Administered 2018-12-18 (×2): 15 mL via PERINEURAL

## 2018-12-18 MED ORDER — FENTANYL CITRATE (PF) 250 MCG/5ML IJ SOLN
INTRAMUSCULAR | Status: AC
  Filled 2018-12-18: qty 5

## 2018-12-18 MED ORDER — CLOPIDOGREL BISULFATE 75 MG PO TABS
75.0000 mg | ORAL_TABLET | Freq: Every day | ORAL | Status: DC
  Administered 2018-12-19 – 2018-12-25 (×7): 75 mg via ORAL
  Filled 2018-12-18 (×7): qty 1

## 2018-12-18 MED ORDER — GLIPIZIDE 5 MG PO TABS
5.0000 mg | ORAL_TABLET | Freq: Two times a day (BID) | ORAL | Status: DC
  Administered 2018-12-18 – 2018-12-25 (×14): 5 mg via ORAL
  Filled 2018-12-18 (×14): qty 1

## 2018-12-18 MED ORDER — PROPOFOL 10 MG/ML IV BOLUS
INTRAVENOUS | Status: DC | PRN
  Administered 2018-12-18: 30 mg via INTRAVENOUS
  Administered 2018-12-18 (×2): 20 mg via INTRAVENOUS

## 2018-12-18 MED ORDER — TAMSULOSIN HCL 0.4 MG PO CAPS
0.4000 mg | ORAL_CAPSULE | Freq: Two times a day (BID) | ORAL | Status: DC
  Administered 2018-12-18 – 2018-12-25 (×14): 0.4 mg via ORAL
  Filled 2018-12-18 (×14): qty 1

## 2018-12-18 MED ORDER — CEFAZOLIN SODIUM-DEXTROSE 2-4 GM/100ML-% IV SOLN
INTRAVENOUS | Status: AC
  Filled 2018-12-18: qty 100

## 2018-12-18 MED ORDER — FAMOTIDINE 20 MG PO TABS
20.0000 mg | ORAL_TABLET | Freq: Every day | ORAL | Status: DC
  Administered 2018-12-19 – 2018-12-25 (×7): 20 mg via ORAL
  Filled 2018-12-18 (×7): qty 1

## 2018-12-18 MED ORDER — CEFAZOLIN SODIUM-DEXTROSE 2-4 GM/100ML-% IV SOLN
2.0000 g | Freq: Four times a day (QID) | INTRAVENOUS | Status: AC
  Administered 2018-12-18 – 2018-12-19 (×3): 2 g via INTRAVENOUS
  Filled 2018-12-18 (×3): qty 100

## 2018-12-18 MED ORDER — ONDANSETRON HCL 4 MG PO TABS: 4.0000 mg | ORAL_TABLET | Freq: Four times a day (QID) | ORAL | Status: DC | PRN

## 2018-12-18 MED ORDER — LACTATED RINGERS IV SOLN
INTRAVENOUS | Status: DC
  Administered 2018-12-18 (×2): via INTRAVENOUS

## 2018-12-18 MED ORDER — ONDANSETRON HCL 4 MG/2ML IJ SOLN: 4.0000 mg | Freq: Four times a day (QID) | INTRAMUSCULAR | Status: DC | PRN

## 2018-12-18 MED ORDER — METOCLOPRAMIDE HCL 5 MG PO TABS: 5.0000 mg | ORAL_TABLET | Freq: Three times a day (TID) | ORAL | Status: DC | PRN

## 2018-12-18 MED ORDER — DOCUSATE SODIUM 100 MG PO CAPS
100.0000 mg | ORAL_CAPSULE | Freq: Two times a day (BID) | ORAL | Status: DC
  Administered 2018-12-19 – 2018-12-25 (×13): 100 mg via ORAL
  Filled 2018-12-18 (×13): qty 1

## 2018-12-18 MED ORDER — SENNOSIDES-DOCUSATE SODIUM 8.6-50 MG PO TABS
1.0000 | ORAL_TABLET | Freq: Two times a day (BID) | ORAL | Status: DC | PRN
  Administered 2018-12-20: 1 via ORAL
  Filled 2018-12-18 (×2): qty 1

## 2018-12-18 MED ORDER — HYDROMORPHONE HCL 1 MG/ML IJ SOLN
0.5000 mg | INTRAMUSCULAR | Status: DC | PRN
  Administered 2018-12-19 (×2): 0.5 mg via INTRAVENOUS
  Filled 2018-12-18 (×2): qty 1

## 2018-12-18 MED ORDER — ATORVASTATIN CALCIUM 40 MG PO TABS
40.0000 mg | ORAL_TABLET | Freq: Every day | ORAL | Status: DC
  Administered 2018-12-19 – 2018-12-25 (×7): 40 mg via ORAL
  Filled 2018-12-18 (×7): qty 1

## 2018-12-18 SURGICAL SUPPLY — 37 items
BLADE SAW RECIP 87.9 MT (BLADE) ×2 IMPLANT
BLADE SURG 21 STRL SS (BLADE) ×2 IMPLANT
BNDG COHESIVE 6X5 TAN STRL LF (GAUZE/BANDAGES/DRESSINGS) IMPLANT
CANISTER WOUND CARE 500ML ATS (WOUND CARE) ×2 IMPLANT
COVER SURGICAL LIGHT HANDLE (MISCELLANEOUS) ×2 IMPLANT
COVER WAND RF STERILE (DRAPES) IMPLANT
CUFF TOURN SGL QUICK 34 (TOURNIQUET CUFF) ×1
CUFF TRNQT CYL 34X4.125X (TOURNIQUET CUFF) ×1 IMPLANT
DRAPE INCISE IOBAN 66X45 STRL (DRAPES) ×2 IMPLANT
DRAPE U-SHAPE 47X51 STRL (DRAPES) ×2 IMPLANT
DRESSING PREVENA PLUS CUSTOM (GAUZE/BANDAGES/DRESSINGS) ×1 IMPLANT
DRSG PREVENA PLUS CUSTOM (GAUZE/BANDAGES/DRESSINGS) ×2
DURAPREP 26ML APPLICATOR (WOUND CARE) ×2 IMPLANT
ELECT REM PT RETURN 9FT ADLT (ELECTROSURGICAL) ×2
ELECTRODE REM PT RTRN 9FT ADLT (ELECTROSURGICAL) ×1 IMPLANT
GLOVE BIOGEL PI IND STRL 9 (GLOVE) ×1 IMPLANT
GLOVE BIOGEL PI INDICATOR 9 (GLOVE) ×1
GLOVE SURG ORTHO 9.0 STRL STRW (GLOVE) ×2 IMPLANT
GOWN STRL REUS W/ TWL XL LVL3 (GOWN DISPOSABLE) ×2 IMPLANT
GOWN STRL REUS W/TWL XL LVL3 (GOWN DISPOSABLE) ×2
KIT BASIN OR (CUSTOM PROCEDURE TRAY) ×2 IMPLANT
KIT TURNOVER KIT B (KITS) ×2 IMPLANT
MANIFOLD NEPTUNE II (INSTRUMENTS) ×2 IMPLANT
NS IRRIG 1000ML POUR BTL (IV SOLUTION) ×2 IMPLANT
PACK ORTHO EXTREMITY (CUSTOM PROCEDURE TRAY) ×2 IMPLANT
PAD ARMBOARD 7.5X6 YLW CONV (MISCELLANEOUS) ×2 IMPLANT
PREVENA RESTOR ARTHOFORM 46X30 (CANNISTER) ×2 IMPLANT
SPONGE LAP 18X18 RF (DISPOSABLE) IMPLANT
STAPLER VISISTAT 35W (STAPLE) IMPLANT
STOCKINETTE IMPERVIOUS LG (DRAPES) ×2 IMPLANT
SUT ETHILON 2 0 PSLX (SUTURE) IMPLANT
SUT SILK 2 0 (SUTURE) ×1
SUT SILK 2-0 18XBRD TIE 12 (SUTURE) ×1 IMPLANT
SUT VIC AB 1 CTX 27 (SUTURE) ×4 IMPLANT
TOWEL GREEN STERILE (TOWEL DISPOSABLE) ×2 IMPLANT
TUBE CONNECTING 12X1/4 (SUCTIONS) ×2 IMPLANT
YANKAUER SUCT BULB TIP NO VENT (SUCTIONS) ×2 IMPLANT

## 2018-12-18 NOTE — Anesthesia Postprocedure Evaluation (Signed)
Anesthesia Post Note  Patient: [name]  Procedure(s) Performed: LEFT BELOW KNEE AMPUTATION (Left Knee)     Patient location during evaluation: PACU Anesthesia Type: Regional Level of consciousness: awake and alert Pain management: pain level controlled Vital Signs Assessment: post-procedure vital signs reviewed and stable Respiratory status: spontaneous breathing, nonlabored ventilation and respiratory function stable Cardiovascular status: blood pressure returned to baseline and stable Postop Assessment: no apparent nausea or vomiting Anesthetic complications: no    Last Vitals:  Vitals:   12/18/18 1530 12/18/18 1545  BP: (!) 132/59 (!) 124/50  Pulse: (!) 56 (!) 47  Resp: 12 10  Temp:    SpO2: 97% 97%    Last Pain:  Vitals:   12/18/18 1545  PainSc: 0-No pain                 Lidia Collum

## 2018-12-18 NOTE — Progress Notes (Signed)
Pt transported to short stay. Pre-procedure checklist completed. CBG checked prior to transport. Pt is stable at time of transport. [name], RN

## 2018-12-18 NOTE — Progress Notes (Signed)
Occupational Therapy Session/ Discharge Summary  Patient Details  Name: [name] MRN: [id number] Date of Birth: 09/20/1927  Today's Date: 12/18/2018 OT Individual Time: 1000-1100 OT Individual Time Calculation (min): 60 min   Pt is going for sx today for a left BKA. Pt very tearful about current situation but willing to get out of bed and participate in functional tasks. Pt donned pants in supine with rolling with bed rail. Pt able to roll with bed rail with instructional cues with setup. Pants donned with total A rolling. Pt came to EOB with mod A with extra time. Pt donned shirt with mod A sitting EOB. Pt able to maintain sitting balance with supervision. Pt continued to be tearful. Pt transferred via slide board to w/c with total A. Pt was able to remain NWB through left LE. Pt transported to the gym and participated in core and UB strengthening including dynamic sitting balance without back support, ability to come into upright sitting unsupported and maintain to participate in bounce passing a ball with a non weighted bar to a 2 lb weighted bar, functional reach outside of BOS, and cardio endurance with boxing in unsupported sitting. Continued to address dynamic sitting balance in prep for becoming bilateral BKAs.Pt self propelled w/c 100 feet mod I on the way back to his room.    Patient has met 1 of 5 long term goals and did not complete our inpatient rehab program due to needing a revision sx due to poor healing. Pt family has begun training with a hoyer lift as that is their choice of transfer. Family has access to a hoyer lift; they still need to problem solve how to have two caregivers present for transfers via hoyer lift to the BSC and don/ doffing clothing for toileting. Pt continues to require in and out cathing which pt can not perform himself. Not sure if they would be open to SNF to meet his needs if family can not. Pt continues to make slow progress.     ,   Lynsey 12/18/2018, 11:10 AM 

## 2018-12-18 NOTE — Anesthesia Procedure Notes (Signed)
Anesthesia Regional Block: Adductor canal block   Pre-Anesthetic Checklist: ,, timeout performed, Correct Patient, Correct Site, Correct Laterality, Correct Procedure, Correct Position, site marked, Risks and benefits discussed,  Surgical consent,  Pre-op evaluation,  At surgeon's request and post-op pain management  Laterality: Left  Prep: chloraprep       Needles:  Injection technique: Single-shot  Needle Type: Echogenic Stimulator Needle     Needle Length: 10cm  Needle Gauge: 21     Additional Needles:   Procedures:,,,, ultrasound used (permanent image in chart),,,,  Narrative:  Start time: 12/18/2018 12:28 PM End time: 12/18/2018 12:31 PM Injection made incrementally with aspirations every 5 mL.  Performed by: Personally  Anesthesiologist: Lidia Collum, MD  Additional Notes: Monitors applied. Injection made in 5cc increments. No resistance to injection. Good needle visualization. Patient tolerated procedure well.

## 2018-12-18 NOTE — Anesthesia Preprocedure Evaluation (Signed)
Anesthesia Evaluation  Patient identified by MRN, date of birth, ID band Patient awake    Reviewed: Allergy & Precautions, NPO status , Patient's Chart, lab work & pertinent test results  Airway Mallampati: II  TM Distance: >3 FB Neck ROM: Full    Dental no notable dental hx. (+) Upper Dentures, Lower Dentures   Pulmonary sleep apnea , former smoker,    Pulmonary exam normal        Cardiovascular hypertension, Pt. on medications + Peripheral Vascular Disease and +CHF  Normal cardiovascular exam+ dysrhythmias + Valvular Problems/Murmurs AS  Rhythm:Regular Rate:Normal  ECHO 11/19 Left ventricle: The cavity size was normal. There was moderate   concentric hypertrophy. Systolic function was normal. The   estimated ejection fraction was in the range of 60% to 65%. Wall   motion was normal; there were no regional wall motion   abnormalities. Features are consistent with a pseudonormal left   ventricular filling pattern, with concomitant abnormal relaxation   and increased filling pressure (grade 2 diastolic dysfunction).   Doppler parameters are consistent with high ventricular filling   pressure. - Aortic valve: Trileaflet; mildly thickened, moderately calcified   leaflets. There was moderate stenosis. Valve area (VTI): 1.47   cm^2. Valve area (Vmax): 1.4 cm^2. Valve area (Vmean): 1.38 cm^2. - Mitral valve: Mildly calcified annulus. Mildly thickened leaflets   . There was mild regurgitation. - Left atrium: The atrium was mildly to moderately dilated. - Tricuspid valve: There was mild regurgitation. - Pulmonary arteries: PA peak pressure: 31 mm Hg (S).   Neuro/Psych negative neurological ROS  negative psych ROS   GI/Hepatic Neg liver ROS, GERD  ,  Endo/Other  diabetes, Type 2Hypothyroidism   Renal/GU CRFRenal disease  negative genitourinary   Musculoskeletal negative musculoskeletal ROS (+)   Abdominal   Peds negative  pediatric ROS (+)  Hematology  (+) Blood dyscrasia, anemia ,   Anesthesia Other Findings Echo 08/27/18: EF 55-60%, mild LVH, normal RV function, mild MR, moderate AS (mean gradient 25 mmHg), PASP 44  Reproductive/Obstetrics negative OB ROS                            Anesthesia Physical  Anesthesia Plan  ASA: III  Anesthesia Plan: Regional and MAC   Post-op Pain Management:  Regional for Post-op pain   Induction: Intravenous  PONV Risk Score and Plan: 1 and Ondansetron, Treatment may vary due to age or medical condition and Propofol infusion  Airway Management Planned: Nasal Cannula, Natural Airway and Simple Face Mask  Additional Equipment: None  Intra-op Plan:   Post-operative Plan:   Informed Consent: I have reviewed the patients History and Physical, chart, labs and discussed the procedure including the risks, benefits and alternatives for the proposed anesthesia with the patient or authorized representative who has indicated his/her understanding and acceptance.     Dental advisory given  Plan Discussed with:   Anesthesia Plan Comments:        Anesthesia Quick Evaluation

## 2018-12-18 NOTE — H&P (Signed)
[name] is an 85 y.o. male.   Chief Complaint: Stump dehiscence left transmetatarsal amputation with gangrene HPI: Patient is an 85 year old gentleman with peripheral vascular disease.  He is status post a right transtibial amputation.  Patient underwent a transmetatarsal amputation the left for attempted foot salvage intervention.  Patient has had progressive gangrenous changes with wound dehiscence and presents at this time for left transtibial amputation.  Past Medical History:  Diagnosis Date  . Arthritis   . Borderline diabetic   . Diabetes mellitus without complication ([place])   . Glaucoma   . Hyperlipidemia   . Hypertension   . PVD (peripheral vascular disease) ([place])   . Sleep apnea    Cpap ordered but doesnt use    Past Surgical History:  Procedure Laterality Date  . ABDOMINAL AORTOGRAM W/LOWER EXTREMITY N/A 07/27/2018   Procedure: ABDOMINAL AORTOGRAM W/LOWER EXTREMITY;  Surgeon: Lorretta Harp, MD;  Location: [facility];  Service: Cardiovascular;  Laterality: N/A;  . AMPUTATION Right 08/28/2018   Procedure: RIGHT BELOW KNEE AMPUTATION;  Surgeon: [name], MD;  Location: [place];  Service: Orthopedics;  Laterality: Right;  . AMPUTATION Left 12/02/2018   Procedure: LEFT TRANSMETATARSAL AMPUTATION AND NEGATIVE PRESSURE WOUND VAC PLACEMENT;  Surgeon: [name], MD;  Location: [place];  Service: Orthopedics;  Laterality: Left;  . APPENDECTOMY  1943  . BELOW KNEE LEG AMPUTATION Left 12/18/2018  . CATARACT EXTRACTION  2012   x2  . COLONOSCOPY N/A 11/01/2014   Procedure: COLONOSCOPY;  Surgeon: Aviva Signs, MD;  [facility];  Service: Gastroenterology;  Laterality: N/A;  . ESOPHAGOGASTRODUODENOSCOPY N/A 11/01/2014   Procedure: ESOPHAGOGASTRODUODENOSCOPY (EGD);  Surgeon: Aviva Signs, MD;  [facility];  Service: Gastroenterology;  Laterality: N/A;  . HERNIA REPAIR Left   . INGUINAL HERNIA REPAIR Right 03/01/2013   Procedure: RIGHT  INGUINAL HERNIORRHAPHY;  Surgeon: Jamesetta So, MD;  Location: [facility];  Service: General;  Laterality: Right;  . INSERTION OF MESH Right 03/01/2013   Procedure: INSERTION OF MESH;  Surgeon: Jamesetta So, MD;  Location: [facility];  Service: General;  Laterality: Right;  . LOWER EXTREMITY ANGIOGRAPHY Right 08/25/2018   Procedure: LOWER EXTREMITY ANGIOGRAPHY;  Surgeon: Wellington Hampshire, MD;  Location: [facility];  Service: Cardiovascular;  Laterality: Right;  . LOWER EXTREMITY ANGIOGRAPHY N/A 11/25/2018   Procedure: LOWER EXTREMITY ANGIOGRAPHY;  Surgeon: Wellington Hampshire, MD;  Location: [facility];  Service: Cardiovascular;  Laterality: N/A;  . NM MYOCAR PERF WALL MOTION  06/05/2010   no significant ischemia  . PERIPHERAL VASCULAR ATHERECTOMY Right 08/03/2018   Procedure: PERIPHERAL VASCULAR ATHERECTOMY;  Surgeon: Lorretta Harp, MD;  Location: [facility];  Service: Cardiovascular;  Laterality: Right;  . PERIPHERAL VASCULAR ATHERECTOMY Left 11/23/2018   Procedure: PERIPHERAL VASCULAR ATHERECTOMY;  Surgeon: Lorretta Harp, MD;  Location: [facility];  Service: Cardiovascular;  Laterality: Left;  sfa with dc balloon  . PERIPHERAL VASCULAR BALLOON ANGIOPLASTY Left 11/25/2018   Procedure: PERIPHERAL VASCULAR BALLOON ANGIOPLASTY;  Surgeon: Wellington Hampshire, MD;  Location: [facility];  Service: Cardiovascular;  Laterality: Left;  popliteal  . PERIPHERAL VASCULAR INTERVENTION Left 11/25/2018   Procedure: PERIPHERAL VASCULAR INTERVENTION;  Surgeon: Wellington Hampshire, MD;  Location: [facility];  Service: Cardiovascular;  Laterality: Left;  tibial peroneal trunk and peroneal stents   . stent  06/14/2010   left leg  . US ECHOCARDIOGRAPHY  01/21/2006  moderate mitral annular ca+, mild MR, AOV moderately sclerotic.    Family History  Problem Relation Age of Onset  . Cancer Mother   . Heart attack Father   . Stroke Father   . Heart attack Brother   .  Heart attack Brother    Social History:  reports that he quit smoking about 53 years ago. He has quit using smokeless tobacco.  His smokeless tobacco use included chew. He reports that he does not drink alcohol or use drugs.  Allergies:  Allergies  Allergen Reactions  . Iodinated Diagnostic Agents     Other reaction(s): Fever  . Dye Fdc Red [Red Dye] Hives  . Iodine-131 Other (See Comments)    Breakout, fever  . Iohexol      Code: RASH, Desc: PT STATES HOT ALL OVER HAD TO HAVE ICE PACKS ALL OVER HIM AND DIFF BREATHING, PT REFUSED IV DYE     Medications Prior to Admission  Medication Sig Dispense Refill  . acetaminophen (TYLENOL) 325 MG tablet Take 1-2 tablets (325-650 mg total) by mouth every 6 (six) hours as needed for mild pain (pain score 1-3 or temp > 100.5).    [name] acetaminophen (TYLENOL) 500 MG tablet Take 2 tablets (1,000 mg total) by mouth every 8 (eight) hours.    [name] apixaban (ELIQUIS) 5 MG TABS tablet Take 1 tablet (5 mg total) by mouth 2 (two) times daily. 60 tablet   . aspirin EC 81 MG tablet Take 81 mg by mouth daily.    [name] atorvastatin (LIPITOR) 40 MG tablet Take 1 tablet (40 mg total) by mouth daily. 90 tablet 3  . azithromycin (ZITHROMAX) 250 MG tablet Take 1 tablet (250 mg total) by mouth daily. 3 each 0  . clopidogrel (PLAVIX) 75 MG tablet Take 1 tablet (75 mg total) by mouth daily with breakfast.    . dorzolamide-timolol (COSOPT) 22.3-6.8 MG/ML ophthalmic solution Place 1 drop into both eyes 2 (two) times daily.    . famotidine (PEPCID) 20 MG tablet Take 1 tablet (20 mg total) by mouth daily. 60 tablet 0  . finasteride (PROSCAR) 5 MG tablet Take 1 tablet (5 mg total) by mouth every evening. 30 tablet 0  . furosemide (LASIX) 40 MG tablet Take 1 tablet (40 mg total) by mouth daily. 30 tablet 1  . glipiZIDE (GLUCOTROL) 5 MG tablet Take 5 mg by mouth 2 (two) times daily.     . iron polysaccharides (NIFEREX) 150 MG capsule Take 1 capsule (150 mg total) by mouth daily.    [name]  levothyroxine (SYNTHROID) 50 MCG tablet Take 1 tablet (50 mcg total) by mouth daily before breakfast. 30 tablet 0  . Omega-3 Fatty Acids (FISH OIL PO) Take 1 capsule by mouth daily.    . polyethylene glycol (MIRALAX) 17 g packet Take 17 g by mouth daily as needed for moderate constipation. 14 each 0  . potassium chloride (KLOR-CON) 10 MEQ tablet Take 1 tablet (10 mEq total) by mouth daily. 30 tablet 1  . povidone-Iodine (BETADINE) 5 % SOLN topical solution Apply 1 application topically daily as needed for wound care.    [name] SANTYL ointment Apply 1 application topically daily.     [name] senna-docusate (SENOKOT-S) 8.6-50 MG tablet Take 1 tablet by mouth 2 (two) times daily as needed for mild constipation.    . tamsulosin (FLOMAX) 0.4 MG CAPS capsule Take 1 capsule (0.4 mg total) by mouth 2 (two) times daily. 60 capsule 0  . traMADol (ULTRAM) 50 MG  tablet Take 1 tablet (50 mg total) by mouth every 6 (six) hours as needed for moderate pain. 30 tablet   . vitamin B-12 (CYANOCOBALAMIN) 1000 MCG tablet Take 1 tablet (1,000 mcg total) by mouth daily.    . vitamin C (ASCORBIC ACID) 500 MG tablet Take 500 mg by mouth daily.      Results for orders placed or performed during the hospital encounter of 12/18/18 (from the past 48 hour(s))  Glucose, capillary     Status: Abnormal   Collection Time: 12/18/18  2:43 PM  Result Value Ref Range   Glucose-Capillary 149 (H) 70 - 99 mg/dL  Glucose, capillary     Status: Abnormal   Collection Time: 12/18/18  4:35 PM  Result Value Ref Range   Glucose-Capillary 148 (H) 70 - 99 mg/dL   No results found.  Review of Systems  All other systems reviewed and are negative.   Blood pressure (!) 124/50, pulse (!) 47, temperature 97.7 F (36.5 C), resp. rate 10, height 5\' 11"  (1.803 m), weight 93.9 kg, SpO2 97 %. Physical Exam  Examination patient is alert oriented no adenopathy well-dressed normal affect normal respiratory effort.  Examination he has black gangrenous changes  to the left transmetatarsal amputation with wound dehiscence pain. Assessment/Plan Assessment: Gangrene with wound dehiscence left transmetatarsal amputation.  Plan: We will plan for a left transtibial amputation.  Risks and benefits were discussed including risk of the wound not healing.  Patient and his son state they understand and wish to proceed at this time patient will need discharge to skilled nursing.  [name], MD 12/18/2018, 5:09 PM

## 2018-12-18 NOTE — Op Note (Signed)
   Date of Surgery: 12/18/2018  INDICATIONS: Mr. [name] is a 85 y.o.-year-old male who has severe peripheral vascular disease status post a right transtibial amputation who underwent attempted foot salvage with a transmetatarsal amputation on the left.  Patient has had progressive wound dehiscence and gangrenous changes to the left transmetatarsal amputation and presents at this time for transtibial amputation on the left.[name]  PREOPERATIVE DIAGNOSIS: Gangrene wound dehiscence left transmetatarsal amputation  POSTOPERATIVE DIAGNOSIS: Same.  PROCEDURE: Transtibial amputation Application of Prevena wound VAC  SURGEON: Sharol Given, M.D.  ANESTHESIA:  general  IV FLUIDS AND URINE: See anesthesia.  ESTIMATED BLOOD LOSS: Minimal mL.  COMPLICATIONS: None.  DESCRIPTION OF PROCEDURE: The patient was brought to the operating room and underwent a general anesthetic. After adequate levels of anesthesia were obtained patient's lower extremity was prepped using DuraPrep draped into a sterile field. A timeout was called. The foot was draped out of the sterile field with impervious stockinette. A transverse incision was made 11 cm distal to the tibial tubercle. This curved proximally and a large posterior flap was created. The tibia was transected 1 cm proximal to the skin incision. The fibula was transected just proximal to the tibial incision. The tibia was beveled anteriorly. A large posterior flap was created. The sciatic nerve was pulled cut and allowed to retract. The vascular bundles were suture ligated with 2-0 silk. The deep and superficial fascial layers were closed using #1 Vicryl. The skin was closed using staples and 2-0 nylon. The wound was covered with a Prevena wound VAC. There was a good suction fit. A prosthetic shrinker was applied. Patient was extubated taken to the PACU in stable condition.   DISCHARGE PLANNING:  Antibiotic duration: 24 hours  Weightbearing: Nonweightbearing on the left  Pain  medication: Opioid pathway  Dressing care/ Wound VAC: Wound VAC continue with discharge  Discharge to: Discharge to skilled nursing  Follow-up: In the office 1 week post operative.  [name], MD [place] 2:40 PM

## 2018-12-18 NOTE — Transfer of Care (Signed)
Immediate Anesthesia Transfer of Care Note  Patient: [name]  Procedure(s) Performed: LEFT BELOW KNEE AMPUTATION (Left Knee)  Patient Location: PACU  Anesthesia Type:MAC and Regional  Level of Consciousness: drowsy and patient cooperative  Airway & Oxygen Therapy: Patient Spontanous Breathing and Patient connected to face mask oxygen  Post-op Assessment: Report given to RN and Post -op Vital signs reviewed and stable  Post vital signs: Reviewed and stable  Last Vitals:  Vitals Value Taken Time  BP 113/55 12/18/18 1445  Temp 36.5 C 12/18/18 1445  Pulse 54 12/18/18 1446  Resp 11 12/18/18 1446  SpO2 99 % 12/18/18 1446  Vitals shown include unvalidated device data.  Last Pain:  Vitals:   12/18/18 1243  PainSc: 0-No pain         Complications: No apparent anesthesia complications

## 2018-12-18 NOTE — Anesthesia Procedure Notes (Signed)
Anesthesia Regional Block: Popliteal block   Pre-Anesthetic Checklist: ,, timeout performed, Correct Patient, Correct Site, Correct Laterality, Correct Procedure, Correct Position, site marked, Risks and benefits discussed,  Surgical consent,  Pre-op evaluation,  At surgeon's request and post-op pain management  Laterality: Left  Prep: chloraprep       Needles:  Injection technique: Single-shot  Needle Type: Echogenic Stimulator Needle     Needle Length: 10cm  Needle Gauge: 21     Additional Needles:   Procedures:,,,, ultrasound used (permanent image in chart),,,,  Narrative:  Start time: 12/18/2018 12:31 PM End time: 12/18/2018 12:33 PM Injection made incrementally with aspirations every 5 mL.  Performed by: Personally  Anesthesiologist: Lidia Collum, MD  Additional Notes: Monitors applied. Injection made in 5cc increments. No resistance to injection. Good needle visualization. Patient tolerated procedure well.

## 2018-12-18 NOTE — Progress Notes (Signed)
[facility] PHYSICAL MEDICINE & REHABILITATION PROGRESS NOTE   Subjective/Complaints:  Pt reports all right- not really ready for surgery - but willing to do it.   ROS- denies SOB, CP, N/V/D/C; pain is controlled usually  Objective:   No results found. Recent Labs    12/17/18 0646  WBC 8.7  HGB 9.8*  HCT 29.3*  PLT 351   Recent Labs    12/17/18 0646  NA 134*  K 3.9  CL 97*  CO2 27  GLUCOSE 131*  BUN 25*  CREATININE 1.46*  CALCIUM 8.9    Intake/Output Summary (Last 24 hours) at 12/18/2018 0838 Last data filed at 12/18/2018 0758 Gross per 24 hour  Intake 360 ml  Output 2450 ml  Net -2090 ml     Physical Exam: Vital Signs Blood pressure (!) 140/58, pulse (!) 57, temperature 98 F (36.7 C), temperature source Oral, resp. rate 16, height 5\' 11"  (1.803 m), weight 93.9 kg, SpO2 96 %.  Physical Exam  Gen:in pain, NAD, just woke up this AM- doesn't even have eyeglasses on, appears comfortable; sitting up in bed; PT in room.  HEENT: oral mucosa pink and moist, NCAT Cardio: Regular rate- has holosystolic murmur;  Chest: normal effort, normal rate of breathing; CTA B/L- adequate air movement B/L Abd: soft, non-distended; NT, ND, (+)BS Ext: no edema of LEs Skin: Right BKA well-healed.  Left transmetatarsal amputation site dressed with ACE wrap in place on L TMA.  Appropriately tender.     Neuro: pt is vague and slow to process, but remembers examiner.  Musculoskeletal: 4/5 in upper extremities bilaterally. Patient is unable to participate in lower extremity testing  Psych: pleasant, normal affect; appears appropriate    Assessment/Plan: 1. Functional deficits secondary to L TMA in setting of R older BKA which require 3+ hours per day of interdisciplinary therapy in a comprehensive inpatient rehab setting.  Physiatrist is providing close team supervision and 24 hour management of active medical problems listed below.  Physiatrist and rehab team continue to assess  barriers to discharge/monitor patient progress toward functional and medical goals  Care Tool:  Bathing    Body parts bathed by patient: Right arm, Left arm, Chest, Abdomen, Face, Left upper leg, Right upper leg   Body parts bathed by helper: Buttocks, Left lower leg Body parts n/a: Right lower leg, Front perineal area   Bathing assist Assist Level: Maximal Assistance - Patient 24 - 49%     Upper Body Dressing/Undressing Upper body dressing   What is the patient wearing?: Pull over shirt    Upper body assist Assist Level: Maximal Assistance - Patient 25 - 49%    Lower Body Dressing/Undressing Lower body dressing      What is the patient wearing?: Incontinence brief     Lower body assist Assist for lower body dressing: Total Assistance - Patient < 25%     Toileting Toileting    Toileting assist Assist for toileting: Total Assistance - Patient < 25%     Transfers Chair/bed transfer  Transfers assist  Chair/bed transfer activity did not occur: Safety/medical concerns  Chair/bed transfer assist level: Dependent - mechanical lift     Locomotion Ambulation   Ambulation assist   Ambulation activity did not occur: Safety/medical concerns(NWB LLE and R BKA)          Walk 10 feet activity   Assist  Walk 10 feet activity did not occur: Safety/medical concerns        Walk 50 feet activity  Assist Walk 50 feet with 2 turns activity did not occur: Safety/medical concerns         Walk 150 feet activity   Assist Walk 150 feet activity did not occur: Safety/medical concerns         Walk 10 feet on uneven surface  activity   Assist Walk 10 feet on uneven surfaces activity did not occur: Safety/medical concerns         Wheelchair     Assist Will patient use wheelchair at discharge?: Yes Type of Wheelchair: Manual    Wheelchair assist level: Supervision/Verbal cueing Max wheelchair distance: 57'    Wheelchair 50 feet with 2 turns  activity    Assist        Assist Level: Supervision/Verbal cueing   Wheelchair 150 feet activity     Assist  Wheelchair 150 feet activity did not occur: Safety/medical concerns(endurance)       Blood pressure (!) 140/58, pulse (!) 57, temperature 98 F (36.7 C), temperature source Oral, resp. rate 16, height 5\' 11"  (1.803 m), weight 93.9 kg, SpO2 96 %.    Medical Problem List and Plan: 1.  Decreased functional mobility secondary to left transmetatarsal amputation 12/02/2018 with wound VAC applied as well as history of right BKA August 2020             -Will consult with surgical team to determine when patient is cleared to shower.   11/26- didn't see wound VAC that was mentioned; will d/w nursing- maybe under dressing?   11/28- wound VAC is off  12/2- will call Vascular because wound is not looking good.  12/3- going back to OR for BKA of LLE Friday- will make NPO- he's an add-on. Will hold lovenox/eliquis for OR.  12/4- surgery today- not sure if will be coming back immediately.             -ELOS/Goals: MinA in PT and ADLs, independent in SLP             Sleeping well, pain controlled, moving bowels. 2.  Antithrombotics: -DVT/anticoagulation: Eliquis             -antiplatelet therapy: Plavix 75 mg daily 3. Pain Management: Ultram as needed.   11/26- pain meds work "sometimes" per pt- pt on Tylenol 650 mg q4 hours prn; tramadol 50 mg q6 hours prn; or oxycodone q4 hours prn. 4. Mood: Provide emotional support             -antipsychotic agents: N/A 5. Neuropsych: This patient is capable of making decisions on his own behalf. 6. Skin/Wound Care: Routine skin checks 7. Fluids/Electrolytes/Nutrition:  Monitor I/O. Check lytes in am.  8.  Acute blood loss anemia.  Follow-up CBC--history of heme positive stools.  Continue iron supplement 9.  PAF.  Cardiac rate controlled.  Continue Eliquis.  Follow-up cardiology services as needed 10.  Diastolic congestive heart failure.   Monitor weight daily--have been inconsistent. Will continue Lasix 20 mg daily--s/p IV lasix 20 mg 11/24. Was on 40 mg bid PTA due to recent decompensation.  Will recheck CXR and may need to resume due to progressive pleural effusions.   11/26- on Lasix 20 mg daily- will con't and monitor daily weights  11/28- Weight wasn't checked daily, but increased 96 to 97.3 kg in last 3 days- BUN up to 27, so don't want to increase Lasix currently- labs Monday   11/29- weight back down to 96.7kg  11/30: weight decreased to 94.5 kg.  121- weight 94.3  kg  12/2- 93.9kg   Filed Weights   12/15/18 0500 12/16/18 0500 12/18/18 0500  Weight: 94.3 kg 93.9 kg 93.9 kg   11.  Hypertension.  Norvasc 10 mg daily.  Monitor with increased mobility 12.  Diabetes mellitus with peripheral neuropathy.  Latest hemoglobin A1c 7.0.  SSI.  Patient on glipizide hold. CBG (last 3)  Recent Labs    12/17/18 1131 12/17/18 1728 12/18/18 0632  GLUCAP 158* 133* 116*    11/29- BGs adequate control- 1 high BG  12/4 BG well controlled  -will need something to help while waiting for surgery today. .  13.  Hypothyroidism. On Synthroid for supplement 14.  BPH: Monitor voiding with PVR checks. Continue Proscar 5 mg daily, Flomax 0.4 mg twice daily.   15.  Hyperlipidemia.  Lipitor  16. Leucocytosis/?PNA: Monitor cough--on Ceftriaxone/Azithormycin D#2  11/26- on Rocephin for CAP 1 g q24 hours currently- WBC down to 11.1k from 12.7k.  11/27- will recheck labs in AM  11/28- WBC down to 10.6k- trending in right direction- if increases, recheck CXR  11/30: Resolved 17. CKD stage III: Will monitor renal status with serial checks.   11/26- Cr up slightly to 1.48  11/27- will recheck labs in AM  11/28- Cr 1.41- slightly less, but BUN up to 27 from 21- will enocourage PO/fluid intake  12/1- Cr stable at 1.4 and BUN still 27- likely due to IV Lasix Sunday 18. Hyponatremia  11/26- Na 131 up from 130.  11/28- Na 129-will recheck Monday- is  a little low, but basically stable.   11/30: Na Improved to 132 19. Urinary retention with BPH  11/28- needed in/out cath last night for retention- on Proscar AND Flomax 0.4 mg BID - cannot increase- will check PVRs and cath if needed.   12/2- will need f/u with Outpatient Urology- son will talk to siblings and see if they want to in/ou cath pt or do foley- he has had 2 UTIs in last 3 months since last CIR d/c- so likely was retaining last few months.  12/4- still requiring in/out caths 20. DISPO  12/2-called son and spent 25 minutes on phone- in addition to rounds, spent 35 minutes on direct pt care today.    LOS: 9 days A FACE TO FACE EVALUATION WAS PERFORMED  Avaline Stillson 12/18/2018, 8:38 AM

## 2018-12-19 ENCOUNTER — Inpatient Hospital Stay (HOSPITAL_COMMUNITY): Payer: Medicare Other | Admitting: Occupational Therapy

## 2018-12-19 ENCOUNTER — Encounter (HOSPITAL_COMMUNITY): Payer: Self-pay | Admitting: Orthopedic Surgery

## 2018-12-19 LAB — GLUCOSE, CAPILLARY
Glucose-Capillary: 91 mg/dL (ref 70–99)
Glucose-Capillary: 134 mg/dL — ABNORMAL HIGH (ref 70–99)
Glucose-Capillary: 142 mg/dL — ABNORMAL HIGH (ref 70–99)
Glucose-Capillary: 158 mg/dL — ABNORMAL HIGH (ref 70–99)

## 2018-12-19 LAB — CBC
HCT: 27.7 % — ABNORMAL LOW (ref 39.0–52.0)
Hemoglobin: 9.2 g/dL — ABNORMAL LOW (ref 13.0–17.0)
MCH: 30.2 pg (ref 26.0–34.0)
MCHC: 33.2 g/dL (ref 30.0–36.0)
MCV: 90.8 fL (ref 80.0–100.0)
Platelets: 318 10*3/uL (ref 150–400)
RBC: 3.05 MIL/uL — ABNORMAL LOW (ref 4.22–5.81)
RDW: 15.5 % (ref 11.5–15.5)
WBC: 11.4 10*3/uL — ABNORMAL HIGH (ref 4.0–10.5)
nRBC: 0 % (ref 0.0–0.2)

## 2018-12-19 NOTE — Progress Notes (Signed)
Occupational Therapy Evaluation Patient Details Name: [name] MRN: [id number] DOB: 1927/10/27 Today's Date: 12/19/2018    History of Present Illness 85 year old past medical history significant for HTN, hyperlipidemia, diabetes type 2, chronic kidney disease a stage III, PE, A. fib, diastolic heart failure, right BKA admitted from home with with black discoloration of first toe on the left foot.  Now s/p L transmetatarsal amputation of L foot   Clinical Impression   Pt presents with above diagnosis. PTA pt PLOF requiring assistance with most ADLs and IADLs in home environment from family. Pt currently limited due to pain, decreased strength, stability and function. Pt will benefit from additional skilled OT to address established deficits and to maximize independence prior to dc setting. DC recommendation CIR or SNF if pt has not progressed physically or can not tolerate inpatient rehab.     Follow Up Recommendations  CIR;Supervision/Assistance - 24 hour;SNF    Equipment Recommendations  Other (comment)(tbd to next venue)    Recommendations for Other Services Rehab consult     Precautions / Restrictions Precautions Precautions: Fall Precaution Comments: preexisting R BKA; keep left Le elevated Required Braces or Orthoses: Other Brace Other Brace: cast shoe to protect L foot Restrictions Weight Bearing Restrictions: Yes LLE Weight Bearing: Non weight bearing Other Position/Activity Restrictions: R BK amp is healed      Mobility Bed Mobility Overal bed mobility: Needs Assistance Bed Mobility: Rolling Rolling: Mod assist;+2 for physical assistance   Supine to sit: Max assist;HOB elevated     General bed mobility comments: Attempt to transistion from recline to sit EOB, pt demonstrates weakness to elevate trunk and manage LE to EOB. Max A required. Limited to rolling with Mod A, required more assistance rolling toward L side.   Transfers Overall transfer level: Needs  assistance               General transfer comment: transfer limited due to fatigue and weakness.     Balance Overall balance assessment: Needs assistance Sitting-balance support: No upper extremity supported Sitting balance-Leahy Scale: Poor   Postural control: Right lateral lean(as he fatigued)   Standing balance-Leahy Scale: Zero                             ADL either performed or assessed with clinical judgement   ADL Overall ADL's : Needs assistance/impaired Eating/Feeding: Supervision/ safety;Sitting   Grooming: Oral care;Sitting;Supervision/safety;Set up   Upper Body Bathing: Moderate assistance   Lower Body Bathing: Maximal assistance   Upper Body Dressing : Maximal assistance   Lower Body Dressing: Maximal assistance     Toilet Transfer Details (indicate cue type and reason): transfer limited due to decreased tolerance and weakness with bed mobility Toileting- Clothing Manipulation and Hygiene: Maximal assistance;+2 for safety/equipment;Bed level         General ADL Comments: session limited to rolling at bed level to assess strength for bed mobility. Pt unable to tolerate to progress from recline to sitting EOB due to weakness.     Vision Baseline Vision/History: Wears glasses Wears Glasses: At all times Patient Visual Report: No change from baseline Vision Assessment?: No apparent visual deficits     Perception     Praxis      Pertinent Vitals/Pain Pain Assessment: Faces Faces Pain Scale: Hurts little more Pain Location: L foot Pain Descriptors / Indicators: Throbbing Pain Intervention(s): Limited activity within patient's tolerance;Premedicated before session;Repositioned     Hand Dominance Right  Extremity/Trunk Assessment Upper Extremity Assessment Upper Extremity Assessment: Generalized weakness       Cervical / Trunk Assessment Cervical / Trunk Assessment: Normal   Communication Communication Communication: HOH    Cognition Arousal/Alertness: Awake/alert Behavior During Therapy: WFL for tasks assessed/performed Overall Cognitive Status: History of cognitive impairments - at baseline                                     General Comments       Exercises     Shoulder Instructions      Home Living Family/patient expects to be discharged to:: Unsure Living Arrangements: Children Available Help at Discharge: Family;Available 24 hours/day Type of Home: House Home Access: Ramped entrance     Home Layout: One level     Bathroom Shower/Tub: Occupational psychologist: Standard Bathroom Accessibility: Yes How Accessible: Accessible via walker Home Equipment: [place] - 2 wheels;Wheelchair - [employer];Hospital bed   Additional Comments: hoyer lift  Lives With: Alone    Prior Functioning/Environment Level of Independence: Needs assistance  Gait / Transfers Assistance Needed: family using lift at home ADL's / Homemaking Assistance Needed: family assists bathing and dressing Communication / Swallowing Assistance Needed: HOH Comments: patietn wheelchair level since 02/2018        OT Problem List: Decreased activity tolerance;Impaired balance (sitting and/or standing);Decreased knowledge of precautions;Pain      OT Treatment/Interventions: Self-care/ADL training;Therapeutic exercise;DME and/or AE instruction;Therapeutic activities;Patient/family education;Balance training    OT Goals(Current goals can be found in the care plan section) Acute Rehab OT Goals Patient Stated Goal: no goal stated OT Goal Formulation: With patient/family Time For Goal Achievement: 12/17/18 Potential to Achieve Goals: Good  OT Frequency: Min 2X/week   Barriers to D/C:            Co-evaluation              AM-PAC OT "6 Clicks" Daily Activity     Outcome Measure Help from another person eating meals?: None Help from another person taking care of personal grooming?: A  Little Help from another person toileting, which includes using toliet, bedpan, or urinal?: A Lot Help from another person bathing (including washing, rinsing, drying)?: A Lot Help from another person to put on and taking off regular upper body clothing?: A Lot Help from another person to put on and taking off regular lower body clothing?: Total 6 Click Score: 14   End of Session Equipment Utilized During Treatment: Other (comment)(maximove) Nurse Communication: Mobility status;Need for lift equipment  Activity Tolerance: Patient limited by pain Patient left: with call bell/phone within reach;in bed;with bed alarm set  OT Visit Diagnosis: Other abnormalities of gait and mobility (R26.89);Muscle weakness (generalized) (M62.81);Pain Pain - Right/Left: Left Pain - part of body: Ankle and joints of foot                Time: 6789-3810 OT Time Calculation (min): 16 min Charges:  OT General Charges $OT Visit: 1 Visit OT Evaluation $OT Eval Moderate Complexity: [place], MSOT, OTR/L  Supplemental Rehabilitation Services  [phone]   Marius Ditch 12/19/2018, 10:04 AM

## 2018-12-19 NOTE — Progress Notes (Signed)
Inpatient Rehabilitation Admissions Coordinator  Patient admitted to CIR on 11/25. Readmitted to acute by Dr. Sharol Given for additional surer on 12/4. I will follow up with patient and son on Mondays to assist in planning rehab venue options pending family preference and insurance approval.  Danne Baxter, RN, MSN Rehab Admissions Coordinator [phone] 12/19/2018 1:41 PM

## 2018-12-19 NOTE — Progress Notes (Signed)
Patient ID: [name], male   DOB: 1927/06/14, 85 y.o.   MRN: [id number] Postoperative day one left transtibial amputation. There is no drainage in the wound VAC canister good suction fit. Anticipate discharge to skilled nursing.

## 2018-12-19 NOTE — Evaluation (Signed)
Physical Therapy Evaluation Patient Details Name: [name] MRN: [id number] DOB: April 06, 1927 Today's Date: 12/19/2018   History of Present Illness  Pt is an 85 y/o male admitted from CIR for planned L transtibial amputation. PMH including but not limited to R transtibial amputation in August of 2020, L transmetatarsal amputation on 12/02/18, DM, HTN, PVD.    Clinical Impression  Pt presented supine in bed with HOB elevated, awake and willing to participate in therapy session. Pt's son present throughout session as well and providing history information. Prior to admission, pt was at [facility] following a L transmetatarsal amputation on 12/02/18. He was apparently working with therapists using his new R LE prosthesis and a STEDY to stand. At the time of evaluation, pt significantly limited secondary to pain, fatigue, weakness and cognitive deficits. Pt's son concerned that his pain medication has affected his cognition and alertness greatly. Pt required min-mod A x2 for bed mobility and progressed from mod A to close min guard with bilateral UEs to maintain upright sitting at EOB. PT will continue to follow pt acutely to progress mobility as tolerated.     Follow Up Recommendations CIR;Supervision/Assistance - 24 hour    Equipment Recommendations  None recommended by PT    Recommendations for Other Services       Precautions / Restrictions Precautions Precautions: Fall Precaution Comments: prior R transtibial amputation; wound VAC to L residual limb Restrictions Weight Bearing Restrictions: Yes LLE Weight Bearing: Non weight bearing      Mobility  Bed Mobility Overal bed mobility: Needs Assistance Bed Mobility: Supine to Sit;Sit to Supine     Supine to sit: Mod assist;+2 for physical assistance Sit to supine: Min assist   General bed mobility comments: pt able to achieve long sitting position with use of bilateral UEs on bed rails and mod A x2 at upper trunk  Transfers                 General transfer comment: would require a Hoyer lift at this time; unable to tolerate an A-P transfer this session  Ambulation/Gait                Stairs            Wheelchair Mobility    Modified Rankin (Stroke Patients Only)       Balance Overall balance assessment: Needs assistance Sitting-balance support: Bilateral upper extremity supported Sitting balance-Leahy Scale: Poor Sitting balance - Comments: pt progressing to needing mod A to close min guard with bilateral UEs on bed rails to maintain upright sitting in bed Postural control: Posterior lean                                   Pertinent Vitals/Pain Pain Assessment: Faces Faces Pain Scale: Hurts a little bit Pain Location: L residual limb Pain Descriptors / Indicators: Sore Pain Intervention(s): Monitored during session    Home Living Family/patient expects to be discharged to:: Inpatient rehab                 Additional Comments: hopeful to return to CIR    Prior Function Level of Independence: Needs assistance   Gait / Transfers Assistance Needed: used his R LE prosthesis twice to stand with STEDY in CIR  ADL's / Homemaking Assistance Needed: unsure        Hand Dominance   Dominant Hand: Right    Extremity/Trunk Assessment  Upper Extremity Assessment Upper Extremity Assessment: Generalized weakness    Lower Extremity Assessment Lower Extremity Assessment: Generalized weakness;LLE deficits/detail;RLE deficits/detail RLE Deficits / Details: prior transtibial amputation; skin well healed LLE Deficits / Details: pt demonstrated decreased strength and ROM limitations secondary to post-op pain and weakness; wound VAC in place; pt able to achieve full knee extension and flexion to ~80 degrees       Communication   Communication: HOH  Cognition Arousal/Alertness: Awake/alert Behavior During Therapy: Flat affect Overall Cognitive Status:  Impaired/Different from baseline Area of Impairment: Following commands;Safety/judgement;Problem solving                       Following Commands: Follows one step commands with increased time Safety/Judgement: Decreased awareness of deficits   Problem Solving: Slow processing;Decreased initiation;Difficulty sequencing;Requires verbal cues        General Comments      Exercises Amputee Exercises Knee Flexion: AROM;Strengthening;Left;10 reps;Supine Knee Extension: AROM;Strengthening;Left;10 reps;Supine   Assessment/Plan    PT Assessment Patient needs continued PT services  PT Problem List Decreased strength;Decreased range of motion;Decreased activity tolerance;Decreased balance;Decreased mobility;Decreased coordination;Decreased knowledge of use of DME;Decreased safety awareness;Decreased knowledge of precautions;Pain       PT Treatment Interventions DME instruction;Gait training;Stair training;Functional mobility training;Therapeutic exercise;Therapeutic activities;Neuromuscular re-education;Balance training;Patient/family education;Wheelchair mobility training    PT Goals (Current goals can be found in the Care Plan section)  Acute Rehab PT Goals Patient Stated Goal: to get back to rehab and then eventually home PT Goal Formulation: With patient/family Time For Goal Achievement: 01/02/19 Potential to Achieve Goals: Fair    Frequency Min 5X/week   Barriers to discharge        Co-evaluation               AM-PAC PT "6 Clicks" Mobility  Outcome Measure Help needed turning from your back to your side while in a flat bed without using bedrails?: A Lot Help needed moving from lying on your back to sitting on the side of a flat bed without using bedrails?: A Lot Help needed moving to and from a bed to a chair (including a wheelchair)?: Total Help needed standing up from a chair using your arms (e.g., wheelchair or bedside chair)?: Total Help needed to walk in  hospital room?: Total Help needed climbing 3-5 steps with a railing? : Total 6 Click Score: 8    End of Session   Activity Tolerance: Patient limited by fatigue Patient left: in bed;with call bell/phone within reach;with bed alarm set;with family/visitor present Nurse Communication: Mobility status PT Visit Diagnosis: Other abnormalities of gait and mobility (R26.89)    Time: 6147-0929 PT Time Calculation (min) (ACUTE ONLY): 24 min   Charges:   PT Evaluation $PT Eval Moderate Complexity: 1 Mod PT Treatments $Therapeutic Activity: 8-22 mins        Anastasio Champion, DPT  Acute Rehabilitation Services Pager [phone] Office [place] 12/19/2018, 1:10 PM

## 2018-12-20 ENCOUNTER — Inpatient Hospital Stay (HOSPITAL_COMMUNITY): Payer: Medicare Other

## 2018-12-20 LAB — GLUCOSE, CAPILLARY
Glucose-Capillary: 90 mg/dL (ref 70–99)
Glucose-Capillary: 164 mg/dL — ABNORMAL HIGH (ref 70–99)
Glucose-Capillary: 181 mg/dL — ABNORMAL HIGH (ref 70–99)
Glucose-Capillary: 192 mg/dL — ABNORMAL HIGH (ref 70–99)

## 2018-12-20 LAB — CBC
HCT: 26.2 % — ABNORMAL LOW (ref 39.0–52.0)
Hemoglobin: 8.8 g/dL — ABNORMAL LOW (ref 13.0–17.0)
MCH: 30.4 pg (ref 26.0–34.0)
MCHC: 33.6 g/dL (ref 30.0–36.0)
MCV: 90.7 fL (ref 80.0–100.0)
Platelets: 311 10*3/uL (ref 150–400)
RBC: 2.89 MIL/uL — ABNORMAL LOW (ref 4.22–5.81)
RDW: 15.6 % — ABNORMAL HIGH (ref 11.5–15.5)
WBC: 10.7 10*3/uL — ABNORMAL HIGH (ref 4.0–10.5)
nRBC: 0 % (ref 0.0–0.2)

## 2018-12-20 MED ORDER — TRAMADOL HCL 50 MG PO TABS
50.0000 mg | ORAL_TABLET | Freq: Four times a day (QID) | ORAL | Status: DC | PRN
  Administered 2018-12-20 – 2018-12-23 (×2): 50 mg via ORAL
  Filled 2018-12-20 (×2): qty 1

## 2018-12-20 NOTE — Plan of Care (Signed)
  Problem: Education: Goal: Knowledge of General Education information will improve Description: Including pain rating scale, medication(s)/side effects and non-pharmacologic comfort measures Outcome: Progressing   Problem: Clinical Measurements: Goal: Will remain free from infection Outcome: Progressing   Problem: Activity: Goal: Risk for activity intolerance will decrease Outcome: Progressing   Problem: Pain Managment: Goal: General experience of comfort will improve Outcome: Progressing   Problem: Safety: Goal: Ability to remain free from injury will improve Outcome: Progressing   

## 2018-12-20 NOTE — Progress Notes (Signed)
Physical Therapy Treatment Patient Details Name: [name] MRN: [id number] DOB: 1927/08/05 Today's Date: 12/20/2018    History of Present Illness Pt is an 85 y/o male admitted from CIR for planned L transtibial amputation. PMH including but not limited to R transtibial amputation in August of 2020, L transmetatarsal amputation on 12/02/18, DM, HTN, PVD.    PT Comments    Continuing work on functional mobility and activity tolerance;  Able to perform lateral scooting x2 bed to chair and chair to recliner; Still needing a considerable amount of assistance; continue to agree with return to CIR for intensive therapies   Follow Up Recommendations  CIR;Supervision/Assistance - 24 hour     Equipment Recommendations  None recommended by PT    Recommendations for Other Services       Precautions / Restrictions Precautions Precautions: Fall Precaution Comments: prior R transtibial amputation; wound VAC to L residual limb Required Braces or Orthoses: Other Brace Restrictions LLE Weight Bearing: Non weight bearing Other Position/Activity Restrictions: R BK amp is healed    Mobility  Bed Mobility Overal bed mobility: Needs Assistance Bed Mobility: Supine to Sit     Supine to sit: Mod assist;+2 for physical assistance     General bed mobility comments: Heavy mod assist to rotate and pull to upright sitting; close guard for safety and heavy mod assist to balance and square off hips at EOB  Transfers Overall transfer level: Needs assistance   Transfers: Lateral/Scoot Transfers          Lateral/Scoot Transfers: +2 safety/equipment;+2 physical assistance;Max assist General transfer comment: Lateral scooted to his R side with prostesis on to chair (that doesn't have armrests) and then to recliner; Noted he is able to organize trunk and use UEs to support self to make small posutre and balance corrections during the work on transfers; still, some fatigue noted and he required more  assist as time passed  Ambulation/Gait                 Stairs             Wheelchair Mobility    Modified Rankin (Stroke Patients Only)       Balance     Sitting balance-Leahy Scale: Poor Sitting balance - Comments: pt progressing to needing mod A to close min guard with bilateral UEs on bed rails to maintain upright sitting in bed Postural control: Posterior lean                                  Cognition Arousal/Alertness: Awake/alert Behavior During Therapy: WFL for tasks assessed/performed Overall Cognitive Status: Impaired/Different from baseline Area of Impairment: Problem solving                             Problem Solving: Slow processing;Requires verbal cues;Requires tactile cues        Exercises      General Comments        Pertinent Vitals/Pain Pain Assessment: Faces Faces Pain Scale: Hurts a little bit Pain Location: L residual limb Pain Descriptors / Indicators: Sore Pain Intervention(s): Monitored during session    Home Living                      Prior Function            PT Goals (current goals can now be  found in the care plan section) Acute Rehab PT Goals Patient Stated Goal: to get back to rehab and then eventually home PT Goal Formulation: With patient/family Time For Goal Achievement: 01/02/19 Potential to Achieve Goals: Fair Progress towards PT goals: Progressing toward goals    Frequency    Min 5X/week      PT Plan Current plan remains appropriate    Co-evaluation              AM-PAC PT "6 Clicks" Mobility   Outcome Measure  Help needed turning from your back to your side while in a flat bed without using bedrails?: A Lot Help needed moving from lying on your back to sitting on the side of a flat bed without using bedrails?: A Lot Help needed moving to and from a bed to a chair (including a wheelchair)?: A Lot Help needed standing up from a chair using your arms  (e.g., wheelchair or bedside chair)?: Total Help needed to walk in hospital room?: Total Help needed climbing 3-5 steps with a railing? : Total 6 Click Score: 9    End of Session         PT Visit Diagnosis: Other abnormalities of gait and mobility (R26.89) Pain - Right/Left: Left Pain - part of body: Leg     Time: 7829-5621 PT Time Calculation (min) (ACUTE ONLY): 45 min  Charges:  $Therapeutic Activity: 23-37 mins                     Roney Marion, [place] Pager [phone] Office [place] 12/20/2018, 3:00 PM

## 2018-12-20 NOTE — Progress Notes (Signed)
   Subjective: 2 Days Post-Op Procedure(s) (LRB): LEFT BELOW KNEE AMPUTATION (Left) Patient reports pain as mild.    Objective: Vital signs in last 24 hours: Temp:  [97.8 F (36.6 C)-98.6 F (37 C)] 97.8 F (36.6 C) (12/06 0751) Pulse Rate:  [56-64] 56 (12/06 0751) Resp:  [16-17] 16 (12/06 0325) BP: (127-142)/(52-67) 142/64 (12/06 0751) SpO2:  [94 %-99 %] 94 % (12/06 0751)  Intake/Output from previous day: 12/05 0701 - 12/06 0700 In: 468 [P.O.:468] Out: 800 [Urine:800] Intake/Output this shift: No intake/output data recorded.  Recent Labs    12/19/18 0403 12/20/18 0521  HGB 9.2* 8.8*   Recent Labs    12/19/18 0403 12/20/18 0521  WBC 11.4* 10.7*  RBC 3.05* 2.89*  HCT 27.7* 26.2*  PLT 318 311   No results for input(s): NA, K, CL, CO2, BUN, CREATININE, GLUCOSE, CALCIUM in the last 72 hours. No results for input(s): LABPT, INR in the last 72 hours.  VAC good seal.  No results found.  Assessment/Plan: 2 Days Post-Op Procedure(s) (LRB): LEFT BELOW KNEE AMPUTATION (Left) Plan: SNF  [name] 12/20/2018, 10:01 AM

## 2018-12-21 LAB — BASIC METABOLIC PANEL
Anion gap: 11 (ref 5–15)
BUN: 23 mg/dL (ref 8–23)
CO2: 26 mmol/L (ref 22–32)
Calcium: 8.7 mg/dL — ABNORMAL LOW (ref 8.9–10.3)
Chloride: 98 mmol/L (ref 98–111)
Creatinine, Ser: 1.36 mg/dL — ABNORMAL HIGH (ref 0.61–1.24)
GFR calc Af Amer: 53 mL/min — ABNORMAL LOW (ref >60)
GFR calc non Af Amer: 46 mL/min — ABNORMAL LOW (ref >60)
Glucose, Bld: 100 mg/dL — ABNORMAL HIGH (ref 70–99)
Potassium: 4 mmol/L (ref 3.5–5.1)
Sodium: 135 mmol/L (ref 135–145)

## 2018-12-21 LAB — GLUCOSE, CAPILLARY
Glucose-Capillary: 111 mg/dL — ABNORMAL HIGH (ref 70–99)
Glucose-Capillary: 149 mg/dL — ABNORMAL HIGH (ref 70–99)
Glucose-Capillary: 154 mg/dL — ABNORMAL HIGH (ref 70–99)
Glucose-Capillary: 153 mg/dL — ABNORMAL HIGH (ref 70–99)

## 2018-12-21 LAB — SURGICAL PATHOLOGY

## 2018-12-21 LAB — SARS CORONAVIRUS 2 (TAT 6-24 HRS): SARS Coronavirus 2: NEGATIVE

## 2018-12-21 NOTE — Progress Notes (Addendum)
OT Cancellation Note  Patient Details Name: [name] MRN: [id number] DOB: 06-24-1927   Cancelled Treatment:    Reason Eval/Treat Not Completed: Other (comment). Pt trying to urinate using urinal in bed stating that he feels bladder pressure and is constipated (no BM since last Wednesday per pt and his son) and that he is fatigued this afternoon. Pt's son present and states that pt works better with therapy in the mornings. OT will try back tomorrow morning  Britt Bottom 12/21/2018, 2:30 PM

## 2018-12-21 NOTE — Progress Notes (Signed)
Inpatient Rehabilitation Admissions Coordinator  I met with patient and son at bedside. They are requesting readmit to CIR and if not approved, will d/c directly home per son. No SNF. I will begin insurance authorization with [facility] Medicare/[employer].  Danne Baxter, RN, MSN Rehab Admissions Coordinator [phone] 12/21/2018 2:30 PM

## 2018-12-21 NOTE — Progress Notes (Signed)
POD #3 BKA. Patient recommended by PT for CIR.   VSS . Wound Vac in place no drainage. Comfortable. On tramadol and Tylenol only for pain. Plan for CIR but will order COVID in case needs to go to SNF

## 2018-12-21 NOTE — Consult Note (Signed)
   [facility] CM Inpatient Consult   12/21/2018  [name] 1927-05-22 [id number]  Patient screened for extreme high risk score for unplanned readmission score of 40% and for less than 30 days re-hospitalizations back to acute from CIR.   Review of patient's medical record reveals patient is being recommended to return for inpatient rehab and insurance authorization will be initiated by CIR admission coordinator per notes reviewed. Admitted back to surgical floor after undergoing Left Below Knee amputation noted in the Orthopedics notes for 12/18/2018 with chief complaint of stump dehiscence left transmetatarsal amputation with gangrene per notes.  Primary Care Provider is  Redmond School, MD  Plan:  Follow up with inpatient [facility] team and progress notes for disposition. Previous follow up was anticipated however patient had an inpatient rehab stay. Continue to follow progress and disposition to assess for post hospital care management needs if patient returns home.    Please place a [facility] Care Management consult as appropriate and for questions contact:   Natividad Brood, RN BSN [facility] Liaison  [phone] business mobile phone Toll free office [phone]  Fax number: [phone] [email or web address]

## 2018-12-22 LAB — GLUCOSE, CAPILLARY
Glucose-Capillary: 86 mg/dL (ref 70–99)
Glucose-Capillary: 128 mg/dL — ABNORMAL HIGH (ref 70–99)
Glucose-Capillary: 107 mg/dL — ABNORMAL HIGH (ref 70–99)
Glucose-Capillary: 148 mg/dL — ABNORMAL HIGH (ref 70–99)
Glucose-Capillary: 157 mg/dL — ABNORMAL HIGH (ref 70–99)

## 2018-12-22 NOTE — Progress Notes (Signed)
Sleeping but easily awakened. VSS  [place] in [place]. Reviewed Therapy Notes and Rehab admission notes. Will plan for DC  To CIR Today if insurance approval

## 2018-12-22 NOTE — H&P (Signed)
Physical Medicine and Rehabilitation Admission H&P    CC: L-BKA and history of recent R-BKA   HPI: [name] is an 85 year old male with history of T2DM, PAF, recent admissions for PVD s/p R-BKA as well as acute on chronic diastolic CHF who was originally admitted on 11/23/18 with osteomyelitis right foot and underwent revascularization of distal L-SFA and popliteal artery with left transmetatarsal amputation 12/02/18 in attempts of limb salvage. He was admitted on CIR for rehab 11/25 and has had progressive ischemic changes of wound. Hospital course also significant for intermittent urinary retention, fluid overload, fluctuating WBC and pain.  Dr. Sharol Given consulted and felt that limb not salvageable therefore patient taken to OR 12/18/18 for L-BKA. Post op  ABLA being monitored and has had asymptomatic bradycardia. Therapy ongoing and CIR recommended for resumption of therapy.     Review of Systems  Constitutional: Negative for chills and fever.  HENT: Positive for hearing loss. Negative for ear pain and tinnitus.   Eyes: Negative for blurred vision and double vision.  Respiratory: Negative for cough, shortness of breath and wheezing.   Cardiovascular: Negative for chest pain and palpitations.  Gastrointestinal: Negative for constipation and heartburn.  Genitourinary: Negative for dysuria.  Musculoskeletal: Positive for joint pain.  Skin: Negative for itching and rash.  Neurological: Positive for weakness. Negative for dizziness and headaches.  Psychiatric/Behavioral: Negative for depression and suicidal ideas. The patient is not nervous/anxious.      Past Medical History:  Diagnosis Date  . Arthritis   . Borderline diabetic   . Diabetes mellitus without complication ([place])   . Glaucoma   . Hyperlipidemia   . Hypertension   . PVD (peripheral vascular disease) ([place])   . Sleep apnea    Cpap ordered but doesnt use    Past Surgical History:  Procedure Laterality Date  .  ABDOMINAL AORTOGRAM W/LOWER EXTREMITY N/A 07/27/2018   Procedure: ABDOMINAL AORTOGRAM W/LOWER EXTREMITY;  Surgeon: Lorretta Harp, MD;  Location: [place] CV LAB;  Service: Cardiovascular;  Laterality: N/A;  . AMPUTATION Right 08/28/2018   Procedure: RIGHT BELOW KNEE AMPUTATION;  Surgeon: [name], MD;  Location: [place];  Service: Orthopedics;  Laterality: Right;  . AMPUTATION Left 12/02/2018   Procedure: LEFT TRANSMETATARSAL AMPUTATION AND NEGATIVE PRESSURE WOUND VAC PLACEMENT;  Surgeon: [name], MD;  Location: [place];  Service: Orthopedics;  Laterality: Left;  . AMPUTATION Left 12/18/2018   Procedure: LEFT BELOW KNEE AMPUTATION;  Surgeon: [name], MD;  Location: [place];  Service: Orthopedics;  Laterality: Left;  . APPENDECTOMY  1943  . BELOW KNEE LEG AMPUTATION Left 12/18/2018  . CATARACT EXTRACTION  2012   x2  . COLONOSCOPY N/A 11/01/2014   Procedure: COLONOSCOPY;  Surgeon: Aviva Signs, MD;  [facility];  Service: Gastroenterology;  Laterality: N/A;  . ESOPHAGOGASTRODUODENOSCOPY N/A 11/01/2014   Procedure: ESOPHAGOGASTRODUODENOSCOPY (EGD);  Surgeon: Aviva Signs, MD;  [facility];  Service: Gastroenterology;  Laterality: N/A;  . HERNIA REPAIR Left   . INGUINAL HERNIA REPAIR Right 03/01/2013   Procedure: RIGHT INGUINAL HERNIORRHAPHY;  Surgeon: Jamesetta So, MD;  Location: [facility];  Service: General;  Laterality: Right;  . INSERTION OF MESH Right 03/01/2013   Procedure: INSERTION OF MESH;  Surgeon: Jamesetta So, MD;  Location: [facility];  Service: General;  Laterality: Right;  . LOWER EXTREMITY ANGIOGRAPHY Right 08/25/2018   Procedure: LOWER EXTREMITY ANGIOGRAPHY;  Surgeon: Wellington Hampshire, MD;  Location:  [place] INVASIVE CV LAB;  Service: Cardiovascular;  Laterality: Right;  . LOWER EXTREMITY ANGIOGRAPHY N/A 11/25/2018   Procedure: LOWER EXTREMITY ANGIOGRAPHY;  Surgeon: Wellington Hampshire, MD;  Location: [place] CV LAB;  Service: Cardiovascular;   Laterality: N/A;  . NM MYOCAR PERF WALL MOTION  06/05/2010   no significant ischemia  . PERIPHERAL VASCULAR ATHERECTOMY Right 08/03/2018   Procedure: PERIPHERAL VASCULAR ATHERECTOMY;  Surgeon: Lorretta Harp, MD;  Location: [place] CV LAB;  Service: Cardiovascular;  Laterality: Right;  . PERIPHERAL VASCULAR ATHERECTOMY Left 11/23/2018   Procedure: PERIPHERAL VASCULAR ATHERECTOMY;  Surgeon: Lorretta Harp, MD;  Location: [place] CV LAB;  Service: Cardiovascular;  Laterality: Left;  sfa with dc balloon  . PERIPHERAL VASCULAR BALLOON ANGIOPLASTY Left 11/25/2018   Procedure: PERIPHERAL VASCULAR BALLOON ANGIOPLASTY;  Surgeon: Wellington Hampshire, MD;  Location: [place] CV LAB;  Service: Cardiovascular;  Laterality: Left;  popliteal  . PERIPHERAL VASCULAR INTERVENTION Left 11/25/2018   Procedure: PERIPHERAL VASCULAR INTERVENTION;  Surgeon: Wellington Hampshire, MD;  Location: [place] CV LAB;  Service: Cardiovascular;  Laterality: Left;  tibial peroneal trunk and peroneal stents   . stent  06/14/2010   left leg  . US ECHOCARDIOGRAPHY  01/21/2006   moderate mitral annular ca+, mild MR, AOV moderately sclerotic.    Family History  Problem Relation Age of Onset  . Cancer Mother   . Heart attack Father   . Stroke Father   . Heart attack Brother   . Heart attack Brother     Social History:   Widowed--has been wheelchair bound since R-BKA.  Children have been providing assistance since discharge. He reports that he quit smoking about 53 years ago. He has quit using smokeless tobacco.  His smokeless tobacco use included chew. He reports that he does not drink alcohol or use drugs.    Allergies  Allergen Reactions  . Iodinated Diagnostic Agents     Other reaction(s): Fever  . Dye Fdc Red [Red Dye] Hives  . Iodine-131 Other (See Comments)    Breakout, fever  . Iohexol      Code: RASH, Desc: PT STATES HOT ALL OVER HAD TO HAVE ICE PACKS ALL OVER HIM AND DIFF BREATHING, PT REFUSED IV  DYE     Medications Prior to Admission  Medication Sig Dispense Refill  . acetaminophen (TYLENOL) 325 MG tablet Take 1-2 tablets (325-650 mg total) by mouth every 6 (six) hours as needed for mild pain (pain score 1-3 or temp > 100.5).    [name] acetaminophen (TYLENOL) 500 MG tablet Take 2 tablets (1,000 mg total) by mouth every 8 (eight) hours.    [name] apixaban (ELIQUIS) 5 MG TABS tablet Take 1 tablet (5 mg total) by mouth 2 (two) times daily. 60 tablet   . aspirin EC 81 MG tablet Take 81 mg by mouth daily.    [name] atorvastatin (LIPITOR) 40 MG tablet Take 1 tablet (40 mg total) by mouth daily. 90 tablet 3  . azithromycin (ZITHROMAX) 250 MG tablet Take 1 tablet (250 mg total) by mouth daily. 3 each 0  . clopidogrel (PLAVIX) 75 MG tablet Take 1 tablet (75 mg total) by mouth daily with breakfast.    . dorzolamide-timolol (COSOPT) 22.3-6.8 MG/ML ophthalmic solution Place 1 drop into both eyes 2 (two) times daily.    . famotidine (PEPCID) 20 MG tablet Take 1 tablet (20 mg total) by mouth daily. 60 tablet 0  . finasteride (PROSCAR) 5 MG tablet Take 1 tablet (5  mg total) by mouth every evening. 30 tablet 0  . furosemide (LASIX) 40 MG tablet Take 1 tablet (40 mg total) by mouth daily. 30 tablet 1  . glipiZIDE (GLUCOTROL) 5 MG tablet Take 5 mg by mouth 2 (two) times daily.     . iron polysaccharides (NIFEREX) 150 MG capsule Take 1 capsule (150 mg total) by mouth daily.    [name] levothyroxine (SYNTHROID) 50 MCG tablet Take 1 tablet (50 mcg total) by mouth daily before breakfast. 30 tablet 0  . Omega-3 Fatty Acids (FISH OIL PO) Take 1 capsule by mouth daily.    . polyethylene glycol (MIRALAX) 17 g packet Take 17 g by mouth daily as needed for moderate constipation. 14 each 0  . potassium chloride (KLOR-CON) 10 MEQ tablet Take 1 tablet (10 mEq total) by mouth daily. 30 tablet 1  . povidone-Iodine (BETADINE) 5 % SOLN topical solution Apply 1 application topically daily as needed for wound care.    [name] SANTYL ointment  Apply 1 application topically daily.     [name] senna-docusate (SENOKOT-S) 8.6-50 MG tablet Take 1 tablet by mouth 2 (two) times daily as needed for mild constipation.    . tamsulosin (FLOMAX) 0.4 MG CAPS capsule Take 1 capsule (0.4 mg total) by mouth 2 (two) times daily. 60 capsule 0  . traMADol (ULTRAM) 50 MG tablet Take 1 tablet (50 mg total) by mouth every 6 (six) hours as needed for moderate pain. 30 tablet   . vitamin B-12 (CYANOCOBALAMIN) 1000 MCG tablet Take 1 tablet (1,000 mcg total) by mouth daily.    . vitamin C (ASCORBIC ACID) 500 MG tablet Take 500 mg by mouth daily.      Drug Regimen Review  Drug regimen was reviewed and remains appropriate with no significant issues identified  Home: Home Living Family/patient expects to be discharged to:: Inpatient rehab Living Arrangements: (patietn daughter and son alternate to care for patient in [place]) Available Help at Discharge: Family, Available 24 hours/day Type of Home: House Home Access: Ramped entrance Home Layout: One level Bathroom Shower/Tub: Multimedia programmer: Standard Bathroom Accessibility: Yes Home Equipment: Environmental consultant - 2 wheels, Wheelchair - manual, Bedside commode, Hospital bed Additional Comments: hopeful to return to CIR  Lives With: Alone   Functional History: Prior Function Level of Independence: Needs assistance Gait / Transfers Assistance Needed: used his R LE prosthesis twice to stand with STEDY in CIR ADL's / Homemaking Assistance Needed: unsure Communication / Swallowing Assistance Needed: HOH Comments: patietn wheelchair level since 02/2018  Functional Status:  Mobility: Bed Mobility Overal bed mobility: Needs Assistance Bed Mobility: Supine to Sit, Rolling Rolling: Mod assist, +2 for physical assistance Supine to sit: Mod assist, +2 for physical assistance Sit to supine: Min assist General bed mobility comments: use of rails to elevate trunk and pads to move hips/pelvis to proper position  Transfers Overall transfer level: Needs assistance Equipment used: Rolling walker (2 wheeled) Transfer via Lift Equipment: Stedy Transfers: Sit to/from Stand Sit to Stand: Mod assist, +2 physical assistance, +2 safety/equipment, From elevated surface Anterior-Posterior transfers: Max assist, +2 safety/equipment  Lateral/Scoot Transfers: +2 safety/equipment, +2 physical assistance, Max assist General transfer comment: pt unable to stand enough on first attempt to clear stedy seat; on second attempt able to place rt side of seat and then worked on rt wt-shift lean to get left side of seat under pt; able to stand 3/4 to remove flaps and sit in chair      ADL: ADL Overall ADL's :  Needs assistance/impaired Eating/Feeding: Sitting, Set up Grooming: Wash/dry hands, Wash/dry face, Oral care, Sitting, Min guard Grooming Details (indicate cue type and reason): unsupported sitting in recliner Upper Body Bathing: Minimal assistance, Sitting Upper Body Bathing Details (indicate cue type and reason): simulated, unsupported in recliner. pt with Poor sitting balance unnsupported Lower Body Bathing: Moderate assistance, Sitting/lateral leans Upper Body Dressing : Min guard, Sitting Lower Body Dressing: Maximal assistance Toilet Transfer: Moderate assistance, +2 for physical assistance Toilet Transfer Details (indicate cue type and reason): simulated to recliner with use of Stedy, mod A +2 sit - stnad Toileting- Clothing Manipulation and Hygiene: Total assistance, Sit to/from stand Functional mobility during ADLs: Moderate assistance, +2 for physical assistance, Cueing for safety General ADL Comments: pt participated in bathing and dressing seated in recliner  Cognition: Cognition Overall Cognitive Status: Within Functional Limits for tasks assessed Orientation Level: Oriented X4 Cognition Arousal/Alertness: Awake/alert Behavior During Therapy: WFL for tasks assessed/performed Overall Cognitive  Status: Within Functional Limits for tasks assessed Area of Impairment: Problem solving Following Commands: Follows one step commands with increased time Safety/Judgement: Decreased awareness of deficits Problem Solving: Slow processing, Requires verbal cues, Requires tactile cues   Blood pressure (!) 135/58, pulse (!) 53, temperature 98.5 F (36.9 C), resp. rate 16, height 5\' 11"  (1.803 m), weight 92.5 kg, SpO2 97 %.  Gen:in pain, frail, alert and oriented X 3 HEENT: oral mucosa pink and moist, NCAT Cardio: Reg rate Chest: normal effort, normal rate of breathing+ murmur Abd: soft, non-distended, +BS Ext: no edema Skin: Right BKA well-healed.  Left transmetatarsal amputation site dressed with wound VAC and stump shrinker  Appropriately tender. Resolving ecchymosis right groin.  Neuro: Patient resting comfortably.  Make good eye contact with examiner.  He is a bit confused but was able to provide his name and age with some delay in processing.  Able to follow one step commands Musculoskeletal: 4/5 in upper extremities bilaterally. Unable to participate in lower extremity testing.  Psych: pleasant, normal affect   Results for orders placed or performed during the hospital encounter of 12/18/18 (from the past 48 hour(s))  Glucose, capillary     Status: Abnormal   Collection Time: 12/23/18  4:33 PM  Result Value Ref Range   Glucose-Capillary 123 (H) 70 - 99 mg/dL  Glucose, capillary     Status: Abnormal   Collection Time: 12/23/18  9:32 PM  Result Value Ref Range   Glucose-Capillary 148 (H) 70 - 99 mg/dL  Glucose, capillary     Status: None   Collection Time: 12/24/18  6:46 AM  Result Value Ref Range   Glucose-Capillary 71 70 - 99 mg/dL  Glucose, capillary     Status: None   Collection Time: 12/24/18 11:56 AM  Result Value Ref Range   Glucose-Capillary 95 70 - 99 mg/dL  Glucose, capillary     Status: None   Collection Time: 12/24/18  4:36 PM  Result Value Ref Range    Glucose-Capillary 95 70 - 99 mg/dL  Glucose, capillary     Status: Abnormal   Collection Time: 12/25/18  8:53 AM  Result Value Ref Range   Glucose-Capillary 149 (H) 70 - 99 mg/dL  Glucose, capillary     Status: Abnormal   Collection Time: 12/25/18 11:34 AM  Result Value Ref Range   Glucose-Capillary 106 (H) 70 - 99 mg/dL   No results found.     Medical Problem List and Plan: 1.  Impaired mobility and ADLs secondary to dehiscence of left amputation  stump  -patient may shower, but wound vac should be covered.  -ELOS/Goals: MinA in PT, OT, I in SLP 2.  Antithrombotics: -DVT/anticoagulation:  Pharmaceutical: Other (comment)--Apixaban  -antiplatelet therapy: ASA/Plavix 3. Pain Management: Tylenol prn has been effective--continue with  4. Mood: LCSW to follow for evaluation and support.   -antipsychotic agents: N/A 5. Neuropsych: This patient is capable of making decisions on hs own behalf. 6. Skin/Wound Care: Continue wound VAC for 7 total days. On Vitamin C and will add prostat to promote wound healing.  7. Fluids/Electrolytes/Nutrition: Monitor I/O. Check lytes in am.  8.  Diastolic CHF: Monitor for signs of overload--abdominal girth has decreased. Daily weights. Continue Lasix and heart healthy diet.  9. BPH with urinary retention: On Proscar and Flomax. History of UTIs in past 3 monthsWill continue to monitor voiding with PVR checks.--unable to empty in bed. Attempt to toilet to [facility] to help with emptying.   10. PAF: Monitor HR tid--on Eliquis and atrovastatin 11. ABLA: Continue iron supplement.  12. Constipation: Had small BM 12/11--will augment bowel regimen--add Senna to colace.   13. T2DM: Will monitor BS ac/hs. On Glipizide 5 mg bid--use SSI prn. Appetite has been good per son.  14. HLD: Lipitor 40mg  QD  Bary Leriche, PA-C 12/25/2018   I have personally performed a face to face diagnostic evaluation, including, but not limited to relevant history and physical exam findings,  of this patient and developed relevant assessment and plan.  Additionally, I have reviewed and concur with the physician assistant's documentation above.  Leeroy Cha, MD

## 2018-12-22 NOTE — Progress Notes (Signed)
Occupational Therapy Treatment Patient Details Name: [name] MRN: [id number] DOB: 06-18-1927 Today's Date: 12/22/2018    History of present illness Pt is an 85 y/o male admitted from [facility] for planned L transtibial amputation. PMH including but not limited to R transtibial amputation in August of 2020, L transmetatarsal amputation on 12/02/18, DM, HTN, PVD.   OT comments  Pt making progress with functional goals. Pt sat EOB Mod A +2 and positioned for AP transfer to recliner. AP transfer bed - recliner max A + 2. Attempted to use Stedy once in recliner for sit - stand and pt unable to elevate bottom enough. Pt min A with UB selfcare supported in recliner. Pt with Poor sitting balance and unable to sit upright for ADL tasks without support. Pt's son present and very supportive. Pt very pleasant and cooperative and looking forward to return to [facility]. OT will continue to follow acutely  Follow Up Recommendations  CIR;Supervision/Assistance - 24 hour;SNF    Equipment Recommendations  Other (comment)(TBD at next venue of care)    Recommendations for Other Services      Precautions / Restrictions Precautions Precautions: Fall Precaution Comments: prior R transtibial amputation; wound VAC to L residual limb Required Braces or Orthoses: Other Brace Other Brace: Limbguard LLE Restrictions Weight Bearing Restrictions: Yes LLE Weight Bearing: Non weight bearing Other Position/Activity Restrictions: R BK amp is healed; Prosthesis is new; he is on a wearing/breaking in schedule now: 2 hours in the am, and 2 hours in the pm today, and adding 30 minutes to wearing sessions each day       Mobility Bed Mobility Overal bed mobility: Needs Assistance Bed Mobility: Supine to Sit     Supine to sit: Mod assist;+2 for physical assistance     General bed mobility comments: Heavy mod assist to rotate and pull to upright sitting; Mod assist to rotate hips in prep for anterior posterior  transfer  Transfers Overall transfer level: Needs assistance Equipment used: Rolling walker (2 wheeled) Transfers: Anterior-Posterior Transfer Sit to Stand: +2 physical assistance;Max assist     Anterior-Posterior transfers: Max assist;+2 safety/equipment   General transfer comment: Needed lots of cues and assist to sit fully upright for backwards scooting; max assist and use of pad to slide backwards into chair; Once in chair, worked on sit to stand to [employer] and to stedy onto R single limb stance on R prosthesis; Noting difficulty getting r foot closer to his base of support due to limits of knee flexion in prosthesis; Lots of difficulty leaning forward and powering up; did not stand successfully with +2 assist    Balance Overall balance assessment: Needs assistance Sitting-balance support: Bilateral upper extremity supported Sitting balance-Leahy Scale: Poor Sitting balance - Comments: Lots of work on upright sitting with ant post transfer and work in Chief Executive Officer; Able to pull self from supported sitting  to unsupported sitting in recliner using armrests   Standing balance support: During functional activity Standing balance-Leahy Scale: Zero                             ADL either performed or assessed with clinical judgement   ADL Overall ADL's : Needs assistance/impaired         Upper Body Bathing: Minimal assistance;Sitting Upper Body Bathing Details (indicate cue type and reason): simulated, supported in recliner. pt with Poor sitting balance unnsupported Lower Body Bathing: Maximal assistance   Upper Body Dressing : Minimal  assistance;Sitting       Toilet Transfer: Maximal assistance;+2 for physical assistance;Cueing for safety;Anterior/posterior Armed forces technical officer Details (indicate cue type and reason): AP transfer bed to recliner, attempted to use Stedy,pt unable         Functional mobility during ADLs: Maximal assistance;+2 for physical assistance;Cueing  for safety       Vision Baseline Vision/History: Wears glasses Wears Glasses: At all times Patient Visual Report: No change from baseline     Perception     Praxis      Cognition Arousal/Alertness: Awake/alert Behavior During Therapy: WFL for tasks assessed/performed Overall Cognitive Status: Within Functional Limits for tasks assessed                               Problem Solving: Slow processing;Requires verbal cues;Requires tactile cues          Exercises Other Exercises Other Exercises: instructed pt on chair push ups to improve tricep strenght for sit - stand transitons and transefrs. Pt had difficulty clearing bottom for chair  and to push through with UEs from recliner armrests   Shoulder Instructions       General Comments      Pertinent Vitals/ Pain       Pain Assessment: Faces Faces Pain Scale: Hurts little more Pain Location: L residual limb Pain Descriptors / Indicators: Sore;Grimacing Pain Intervention(s): Monitored during session;Repositioned  Home Living                                          Prior Functioning/Environment              Frequency  Min 2X/week        Progress Toward Goals  OT Goals(current goals can now be found in the care plan section)  Progress towards OT goals: Progressing toward goals  Acute Rehab OT Goals Patient Stated Goal: to get back to rehab and then eventually home OT Goal Formulation: With patient/family  Plan Discharge plan remains appropriate    Co-evaluation    PT/OT/SLP Co-Evaluation/Treatment: Yes Reason for Co-Treatment: For patient/therapist safety;To address functional/ADL transfers PT goals addressed during session: Mobility/safety with mobility OT goals addressed during session: ADL's and self-care;Proper use of Adaptive equipment and DME      AM-PAC OT "6 Clicks" Daily Activity     Outcome Measure   Help from another person eating meals?: None Help  from another person taking care of personal grooming?: A Little Help from another person toileting, which includes using toliet, bedpan, or urinal?: A Lot Help from another person bathing (including washing, rinsing, drying)?: A Lot Help from another person to put on and taking off regular upper body clothing?: A Lot Help from another person to put on and taking off regular lower body clothing?: Total 6 Click Score: 14    End of Session Equipment Utilized During Treatment: Gait belt;Other (comment)(Stedy)  OT Visit Diagnosis: Other abnormalities of gait and mobility (R26.89);Muscle weakness (generalized) (M62.81);Pain Pain - Right/Left: Left Pain - part of body: Leg   Activity Tolerance Patient limited by fatigue   Patient Left with call bell/phone within reach;in chair;with chair alarm set;with family/visitor present   Nurse Communication          Time: 1017-1050 OT Time Calculation (min): 33 min  Charges: OT General Charges $OT Visit: 1 Visit OT Treatments $  Therapeutic Activity: 8-22 mins     Emmit Alexanders [facility] 12/22/2018, 2:39 PM

## 2018-12-22 NOTE — PMR Pre-admission (Signed)
PMR Admission Coordinator Pre-Admission Assessment  Patient: [name] is an 85 y.o., male MRN: [id number] DOB: 1927/08/16 Height: 5' 11"  (180.3 cm) Weight: 92.5 kg  Insurance Information HMO: yes    PPO:      PCP:      IPA:      80/20:      OTHER: medicare advantage plan PRIMARY: [employer]      Policy#: [id number]      Subscriber: pt CM Name: Altha Harm at [facility] appeals department      Phone#: [phone]    Fax#: [phone] Pre-Cert#: [id number] denied and expedited appeal approved for 7 days with f/u fax [phone]      Employer:  Benefits:  Phone #: online     Name: 12/8 Eff. Date: 01/14/2018     Deduct: none      Out of Pocket Max: $3600      Life Max: none CIR: $295 co pay per day days 1 until 5      SNF: no copy per day days 1 until 20; $160 co pay per day days 21 until 43; no co pay days 44 until 100 Outpatient: $30 co pay per visit     Co-Pay: visits per medical neccesity Home Health: 100%      Co-Pay: visits per medical neccesity DME: 80%     Co-Pay: 20% Providers: in network  SECONDARY: none      Medicaid Application Date:       Case Manager:  Disability Application Date:       Case Worker:   The "Data Collection Information Summary" for patients in Inpatient Rehabilitation Facilities with attached "Privacy Act [place] Records" was provided and verbally reviewed with: Patient and Family  Emergency Contact Information Contact Information    Name Relation Home Work [place], [place] Son   [phone]   [name] Daughter   [phone]   Myers,Betty Sister [phone]     Ahmeer, Tuman   [phone]      Current Medical History  Patient Admitting Diagnosis: bilateral BKA  History of Present Illness:85 y.o. male with history of T2DM, HTN, PAF, HTN  PVD s/p R-BKA and left foot ulcer who was admitted on 11/18/18 with progressive redness of left foot with concerns of osteomyelitis. He underwent revascularization with  arthrectomy of distal L-SFA and popliteal artery 11/23/18 as well as stenting with angioplasty of distal left PA in attempts of limb salvage.   He underwent left transmetatarsal amputation with placement of wound VAC on 12/02/18 by Dr. Sharol Given. He was made NWB and wound VAC was removed prior to discharge to CIR. Hospital course significant for leucocytosis with CXR showing worsening of bilateral lung opacities and he was started on Ceftriaxone and Zithromax to complete 5 day antibiotic course. Admitted to CIR on 12/09/2018.  On CIR he was maintained on ceftriaxone through 11/29. He was noted to have peripheral and presacral edema therefore received 40 mg IV lasix. Intermittent urinary retention requiring I and O caths 1 to 2 times per day with PVR > 350 cc. His prosthesis was delivered for his other BKA and training was initiated with focus on NWB LLE> His foot progressed with ischemia along the wound edges. Dr. Sharol Given was contacted and patient was discharged to acute for left BKA on 12/18/2018.  Postoperatively patient with some urinary retention and constipation noted. VAC remains in place.    Patient's medical record from [facility] has been reviewed by the rehabilitation  admission coordinator and physician.  Past Medical History  Past Medical History:  Diagnosis Date  . Arthritis   . Borderline diabetic   . Diabetes mellitus without complication ([place])   . Glaucoma   . Hyperlipidemia   . Hypertension   . PVD (peripheral vascular disease) ([place])   . Sleep apnea    Cpap ordered but doesnt use    Family History   family history includes Cancer in his mother; Heart attack in his brother, brother, and father; Stroke in his father.  Prior Rehab/Hospitalizations Has the patient had prior rehab or hospitalizations prior to admission? Yes CIR 11/25 then to acute 12/4 for surgery  Has the patient had major surgery during 100 days prior to admission? Yes   Current Medications  Current  Facility-Administered Medications:  .  acetaminophen (TYLENOL) tablet 325-650 mg, 325-650 mg, Oral, Q6H PRN, Persons, Bevely Palmer, PA, 650 mg at 12/25/18 0447 .  apixaban (ELIQUIS) tablet 5 mg, 5 mg, Oral, BID, Persons, Bevely Palmer, [place], 5 mg at 12/25/18 0915 .  aspirin EC tablet 81 mg, 81 mg, Oral, Daily, Persons, Bevely Palmer, [place], 81 mg at 12/25/18 0915 .  atorvastatin (LIPITOR) tablet 40 mg, 40 mg, Oral, Daily, Persons, Bevely Palmer, [place], 40 mg at 12/25/18 0915 .  clopidogrel (PLAVIX) tablet 75 mg, 75 mg, Oral, Q breakfast, Persons, Bevely Palmer, [place], 75 mg at 12/25/18 0915 .  docusate sodium (COLACE) capsule 100 mg, 100 mg, Oral, BID, Persons, Bevely Palmer, PA, 100 mg at 12/25/18 0915 .  dorzolamide-timolol (COSOPT) 22.3-6.8 MG/ML ophthalmic solution 1 drop, 1 drop, Both Eyes, BID, Persons, Bevely Palmer, [place], 1 drop at 12/25/18 0916 .  famotidine (PEPCID) tablet 20 mg, 20 mg, Oral, Daily, Persons, Bevely Palmer, PA, 20 mg at 12/25/18 0915 .  finasteride (PROSCAR) tablet 5 mg, 5 mg, Oral, QPM, Persons, Bevely Palmer, PA, 5 mg at 12/24/18 1742 .  furosemide (LASIX) tablet 40 mg, 40 mg, Oral, Daily, Persons, Bevely Palmer, [place], 40 mg at 12/24/18 0954 .  glipiZIDE (GLUCOTROL) tablet 5 mg, 5 mg, Oral, BID, Persons, Bevely Palmer, PA, 5 mg at 12/25/18 0915 .  insulin aspart (novoLOG) injection 0-15 Units, 0-15 Units, Subcutaneous, TID WC, Persons, Bevely Palmer, [place], 2 Units at 12/25/18 0914 .  iron polysaccharides (NIFEREX) capsule 150 mg, 150 mg, Oral, Daily, Persons, Bevely Palmer, [place], 150 mg at 12/25/18 0915 .  lactated ringers infusion, , Intravenous, Continuous, Persons, Bevely Palmer, [place], Last Rate: 10 mL/hr at 12/18/18 2212, New Bag at 12/18/18 2212 .  levothyroxine (SYNTHROID) tablet 50 mcg, 50 mcg, Oral, QAC breakfast, Persons, Bevely Palmer, [place], 50 mcg at 12/25/18 0551 .  metoCLOPramide (REGLAN) tablet 5-10 mg, 5-10 mg, Oral, Q8H PRN **OR** metoCLOPramide (REGLAN) injection 5-10 mg, 5-10 mg, Intravenous, Q8H PRN, Persons, Bevely Palmer, PA .   ondansetron (ZOFRAN) tablet 4 mg, 4 mg, Oral, Q6H PRN **OR** ondansetron (ZOFRAN) injection 4 mg, 4 mg, Intravenous, Q6H PRN, Persons, Bevely Palmer, PA .  polyethylene glycol (MIRALAX / GLYCOLAX) packet 17 g, 17 g, Oral, Daily PRN, Persons, Bevely Palmer, PA .  potassium chloride (KLOR-CON) CR tablet 10 mEq, 10 mEq, Oral, Daily, Persons, Bevely Palmer, PA, 10 mEq at 12/25/18 0915 .  senna-docusate (Senokot-S) tablet 1 tablet, 1 tablet, Oral, BID PRN, Persons, Bevely Palmer, [place], 1 tablet at 12/20/18 1101 .  tamsulosin (FLOMAX) capsule 0.4 mg, 0.4 mg, Oral, BID, Persons, Bevely Palmer, PA, 0.4 mg at 12/25/18 0915 .  traMADol (ULTRAM) tablet 50 mg, 50 mg, Oral, Q6H  PRN, Marybelle Killings, MD, 50 mg at 12/23/18 6295 .  vitamin B-12 (CYANOCOBALAMIN) tablet 1,000 mcg, 1,000 mcg, Oral, Daily, Persons, Bevely Palmer, [place], 1,000 mcg at 12/25/18 0915 .  vitamin C (ASCORBIC ACID) tablet 500 mg, 500 mg, Oral, Daily, Persons, Bevely Palmer, PA, 500 mg at 12/25/18 0915  Patients Current Diet:  Diet Order            Diet - low sodium heart healthy        Diet Carb Modified Fluid consistency: Thin; Room service appropriate? Yes  Diet effective now              Precautions / Restrictions Precautions Precautions: Fall Precaution Comments: prior R transtibial amputation; wound VAC to L residual limb Other Brace: Limbguard LLE Restrictions Weight Bearing Restrictions: Yes LLE Weight Bearing: Non weight bearing Other Position/Activity Restrictions: R BK amp is healed; Prosthesis is new; he is on a wearing/breaking in schedule now: trial for 3 hours today 9:30am-12:30pm   Has the patient had 2 or more falls or a fall with injury in the past year? No  Prior Activity Level  supervision wheelchair level since 02/2018. For 2 months pta, family using a sling at home to assist pt with standing transfers to wheelchair. Due to COVID issues had not been trained on his prosthetic leg for prior right BKA prior to 11/25 CIR admit.   Prior  Functional Level Self Care: Did the patient need help bathing, dressing, using the toilet or eating? Needed some help  Indoor Mobility: Did the patient need assistance with walking from room to room (with or without device)? Needed some help  Stairs: Did the patient need assistance with internal or external stairs (with or without device)? Needed some help  Functional Cognition: Did the patient need help planning regular tasks such as shopping or remembering to take medications? Needed some help  Home Assistive Devices / [place] Devices/Equipment: Hospital bed, Wheelchair, Bedside commode/3-in-1(PROSTETIC LEG) Home Equipment: Environmental consultant - 2 wheels, Wheelchair - manual, Bedside commode, Hospital bed  Prior Device Use: Indicate devices/aids used by the patient prior to current illness, exacerbation or injury? Manual wheelchair  Current Functional Level Cognition  Overall Cognitive Status: Within Functional Limits for tasks assessed Orientation Level: Oriented X4 Following Commands: Follows one step commands with increased time Safety/Judgement: Decreased awareness of deficits    Extremity Assessment (includes Sensation/Coordination)  Upper Extremity Assessment: Generalized weakness  Lower Extremity Assessment: Generalized weakness, LLE deficits/detail, RLE deficits/detail RLE Deficits / Details: prior transtibial amputation; skin well healed LLE Deficits / Details: pt demonstrated decreased strength and ROM limitations secondary to post-op pain and weakness; wound VAC in place; pt able to achieve full knee extension and flexion to ~80 degrees    ADLs  Overall ADL's : Needs assistance/impaired Eating/Feeding: Sitting, Set up Grooming: Wash/dry hands, Wash/dry face, Oral care, Sitting, Min guard Grooming Details (indicate cue type and reason): unsupported sitting in recliner Upper Body Bathing: Minimal assistance, Sitting Upper Body Bathing Details (indicate cue type and  reason): simulated, unsupported in recliner. pt with Poor sitting balance unnsupported Lower Body Bathing: Moderate assistance, Sitting/lateral leans Upper Body Dressing : Min guard, Sitting Lower Body Dressing: Maximal assistance Toilet Transfer: Moderate assistance, +2 for physical assistance Toilet Transfer Details (indicate cue type and reason): simulated to recliner with use of Stedy, mod A +2 sit - stnad Toileting- Clothing Manipulation and Hygiene: Total assistance, Sit to/from stand Functional mobility during ADLs: Moderate assistance, +2 for physical assistance, Cueing for  safety General ADL Comments: pt participated in bathing and dressing seated in recliner    Mobility  Overal bed mobility: Needs Assistance Bed Mobility: Supine to Sit, Rolling Rolling: Mod assist, +2 for physical assistance Supine to sit: Mod assist, +2 for physical assistance Sit to supine: Min assist General bed mobility comments: use of rails to elevate trunk and pads to move hips/pelvis to proper position    Transfers  Overall transfer level: Needs assistance Equipment used: Rolling walker (2 wheeled) Transfer via Lift Equipment: Stedy Transfers: Sit to/from Stand Sit to Stand: Mod assist, +2 physical assistance, +2 safety/equipment, From elevated surface Anterior-Posterior transfers: Max assist, +2 safety/equipment  Lateral/Scoot Transfers: +2 safety/equipment, +2 physical assistance, Max assist General transfer comment: pt unable to stand enough on first attempt to clear stedy seat; on second attempt able to place rt side of seat and then worked on rt wt-shift lean to get left side of seat under pt; able to stand 3/4 to remove flaps and sit in chair    Ambulation / Gait / Stairs / Office manager / Balance Dynamic Sitting Balance Sitting balance - Comments: Lots of work on upright sitting with ant post transfer and work in Chief Executive Officer; Able to pull self from supported sitting  to  unsupported sitting in recliner using armrests Balance Overall balance assessment: Needs assistance Sitting-balance support: Bilateral upper extremity supported Sitting balance-Leahy Scale: Poor Sitting balance - Comments: Lots of work on upright sitting with ant post transfer and work in Chief Executive Officer; Able to pull self from supported sitting  to unsupported sitting in recliner using armrests Postural control: Posterior lean Standing balance support: Bilateral upper extremity supported, During functional activity Standing balance-Leahy Scale: Zero    Special needs/care consideration BiPAP/CPAP  CPM  Continuous Drip IV  Dialysis         Days  Life Vest  Oxygen  Special Bed  Trach Size  Wound Vac (area) yes to surgical site Skin surgical site Bowel mgmt: continent LBM 12/8 constipated  Bladder mgmt: continent. On CIR admit 11/20 patient was I and O cathed usually twice per day due to prostate issues Diabetic mgmt: yes Behavioral consideration  Chemo/radiation  Designated visitor is daughter, [name]. [name] son is returning home to [place]: (patietn daughter and son alternate to care for patient in [place])  Lives With: Alone Available Help at Discharge: Family, Available 24 hours/day Type of Home: House Home Layout: One level Home Access: Ramped entrance Bathroom Shower/Tub: Multimedia programmer: Standard Bathroom Accessibility: Yes How Accessible: Accessible via walker [place]: Yes Type of [place]: Home PT, [place] (if known): [facility] Additional Comments: hopeful to return to CIR  Discharge Living Setting Plans for Discharge Living Setting: Patient's home, Alone Type of Home at Discharge: House Discharge Home Layout: One level Discharge Home Access: [place] entrance Discharge Bathroom Shower/Tub: Walk-in shower Discharge Bathroom Toilet: Standard Discharge Bathroom  Accessibility: Yes How Accessible: Accessible via walker Does the patient have any problems obtaining your medications?: No  Social/Family/Support Systems Patient Roles: Parent Contact Information: son and daughter Anticipated Caregiver: sons and daughter Anticipated Caregiver's Contact Information: see above Ability/Limitations of Caregiver: Daughter and her family recently with COVID 19, son [name]) does NOT have COVID Caregiver Availability: 24/7 Discharge Plan Discussed with Primary Caregiver: Yes Is Caregiver In Agreement with Plan?: Yes Does Caregiver/Family have Issues with Lodging/Transportation while Pt is  in Rehab?: No  Son, [name] returning to [place] now due to fatigue. Daughter, [name] to be his visitor now at [facility]  Goals/Additional Needs Patient/Family Goal for Rehab: min asisst with PT and OT at wheelchair level Expected length of stay: ELOS 7 to 10 days Special Service Needs: [facility] Pt/Family Agrees to Admission and willing to participate: Yes Program Orientation Provided & Reviewed with Pt/Caregiver Including Roles  & Responsibilities: Yes  Decrease burden of Care through IP rehab admission:   Possible need for SNF placement upon discharge: not anticipated  Patient Condition: I have reviewed medical records from [facility] , spoken with CM, and patient and son. I met with patient at the bedside for inpatient rehabilitation assessment.  Patient will benefit from ongoing PT and OT, can actively participate in 3 hours of therapy a day 5 days of the week, and can make measurable gains during the admission.  Patient will also benefit from the coordinated team approach during an Inpatient Acute Rehabilitation admission.  The patient will receive intensive therapy as well as Rehabilitation physician, nursing, social worker, and care management interventions.  Due to bladder management, bowel management, safety, skin/wound care, disease management, medication  administration, pain management and patient education the patient requires 24 hour a day rehabilitation nursing.  The patient is currently mod assist with mobility and basic ADLs.  Discharge setting and therapy post discharge at home with home health is anticipated.  Patient has agreed to participate in the Acute Inpatient Rehabilitation Program and will admit today.  Preadmission Screen Completed By:  Cleatrice Burke, 12/25/2018 10:38 AM ______________________________________________________________________   Discussed status with Dr. Ranell Patrick on  12/25/2018 at  1042 and received approval for admission today.  Admission Coordinator:  Cleatrice Burke, RN, time 2595 Date  12/25/2018   Assessment/Plan: Diagnosis: Impaired mobility and ADLs following dehiscence of amputation stump 1. Does the need for close, 24 hr/day Medical supervision in concert with the patient's rehab needs make it unreasonable for this patient to be served in a less intensive setting? Yes 2. Co-Morbidities requiring supervision/potential complications: Left BKA, overweight, pain, HLD, CAD, PAF, HTN, moderate aortic stenosis 3. Due to bladder management, bowel management, safety, skin/wound care, disease management, medication administration, pain management and patient education, does the patient require 24 hr/day rehab nursing? Yes 4. Does the patient require coordinated care of a physician, rehab nurse, PT, OT, and SLP to address physical and functional deficits in the context of the above medical diagnosis(es)? Yes Addressing deficits in the following areas: balance, endurance, locomotion, strength, transferring, bowel/bladder control, bathing, dressing, feeding, grooming, toileting and psychosocial support 5. Can the patient actively participate in an intensive therapy program of at least 3 hrs of therapy 5 days a week? Yes 6. The potential for patient to make measurable gains while on inpatient rehab is  excellent 7. Anticipated functional outcomes upon discharge from inpatient rehab: min assist PT, min assist OT, modified independent SLP 8. Estimated rehab length of stay to reach the above functional goals is: 10-14 days 9. Anticipated discharge destination: Home 10. Overall Rehab/Functional Prognosis: excellent   MD Signature: Leeroy Cha, MD

## 2018-12-22 NOTE — Plan of Care (Signed)
  Problem: Education: Goal: Knowledge of General Education information will improve Description: Including pain rating scale, medication(s)/side effects and non-pharmacologic comfort measures Outcome: Progressing   Problem: Clinical Measurements: Goal: Will remain free from infection Outcome: Progressing   Problem: Activity: Goal: Risk for activity intolerance will decrease Outcome: Progressing   Problem: Nutrition: Goal: Adequate nutrition will be maintained Outcome: Progressing   Problem: Elimination: Goal: Will not experience complications related to bowel motility Outcome: Progressing   Problem: Safety: Goal: Ability to remain free from injury will improve Outcome: Progressing   Problem: Skin Integrity: Goal: Risk for impaired skin integrity will decrease Outcome: Progressing   

## 2018-12-22 NOTE — Discharge Summary (Signed)
Discharge Diagnoses:  Active Problems:   Gangrene of left foot ([place])   Dehiscence of amputation stump ([facility])   Surgeries: Procedure(s): LEFT BELOW KNEE AMPUTATION on 12/18/2018    Consultants:   Discharged Condition: Improved  Hospital Course: [name] is an 85 y.o. male who was admitted 12/18/2018 with a chief complaint of Left foot Gangrene, with a final diagnosis of Gangrene Left Foot.  Patient was brought to the operating room on 12/18/2018 and underwent Procedure(s): LEFT BELOW KNEE AMPUTATION.    Patient was given perioperative antibiotics:  Anti-infectives (From admission, onward)   Start     Dose/Rate Route Frequency Ordered Stop   12/18/18 2000  ceFAZolin (ANCEF) IVPB 2g/100 mL premix     2 g 200 mL/hr over 30 Minutes Intravenous Every 6 hours 12/18/18 1606 12/20/18 0700   12/18/18 1200  ceFAZolin (ANCEF) IVPB 2g/100 mL premix     2 g 200 mL/hr over 30 Minutes Intravenous On call to O.R. 12/18/18 1152 12/18/18 1432   12/18/18 1155  ceFAZolin (ANCEF) 2-4 GM/100ML-% IVPB    Note to Pharmacy: [facility]   : cabinet override      12/18/18 1155 12/18/18 1402    .  Patient was given sequential compression devices, early ambulation, and aspirin for DVT prophylaxis.  Recent vital signs:  Patient Vitals for the past 24 hrs:  BP Temp Temp src Pulse Resp SpO2 Weight  12/22/18 0309 (!) 129/57 98.3 F (36.8 C) Oral (!) 53 16 95 % -  12/21/18 1930 135/61 99.5 F (37.5 C) Oral 65 18 97 % -  12/21/18 1549 (!) 150/58 98.1 F (36.7 C) Oral 62 16 97 % -  12/21/18 1047 - - - - - - 92.5 kg  12/21/18 0838 129/62 97.7 F (36.5 C) Oral (!) 52 17 96 % -  .  Recent laboratory studies: No results found.  Discharge Medications:   Allergies as of 12/22/2018      Reactions   Iodinated Diagnostic Agents    Other reaction(s): Fever   Dye Fdc Red [red Dye] Hives   Iodine-131 Other (See Comments)   Breakout, fever   Iohexol     Code: RASH, Desc: PT STATES HOT ALL OVER HAD  TO HAVE ICE PACKS ALL OVER HIM AND DIFF BREATHING, PT REFUSED IV DYE      Medication List    STOP taking these medications   azithromycin 250 MG tablet Commonly known as: Zithromax   povidone-Iodine 5 % Soln topical solution Commonly known as: BETADINE     TAKE these medications   acetaminophen 325 MG tablet Commonly known as: TYLENOL Take 1-2 tablets (325-650 mg total) by mouth every 6 (six) hours as needed for mild pain (pain score 1-3 or temp > 100.5). What changed: Another medication with the same name was removed. Continue taking this medication, and follow the directions you see here.   apixaban 5 MG Tabs tablet Commonly known as: ELIQUIS Take 1 tablet (5 mg total) by mouth 2 (two) times daily.   aspirin EC 81 MG tablet Take 81 mg by mouth daily.   atorvastatin 40 MG tablet Commonly known as: LIPITOR Take 1 tablet (40 mg total) by mouth daily.   clopidogrel 75 MG tablet Commonly known as: PLAVIX Take 1 tablet (75 mg total) by mouth daily with breakfast.   dorzolamide-timolol 22.3-6.8 MG/ML ophthalmic solution Commonly known as: COSOPT Place 1 drop into both eyes 2 (two) times daily.   famotidine 20 MG tablet Commonly  known as: PEPCID Take 1 tablet (20 mg total) by mouth daily.   finasteride 5 MG tablet Commonly known as: PROSCAR Take 1 tablet (5 mg total) by mouth every evening.   FISH OIL PO Take 1 capsule by mouth daily.   furosemide 40 MG tablet Commonly known as: LASIX Take 1 tablet (40 mg total) by mouth daily.   glipiZIDE 5 MG tablet Commonly known as: GLUCOTROL Take 5 mg by mouth 2 (two) times daily.   iron polysaccharides 150 MG capsule Commonly known as: NIFEREX Take 1 capsule (150 mg total) by mouth daily.   levothyroxine 50 MCG tablet Commonly known as: SYNTHROID Take 1 tablet (50 mcg total) by mouth daily before breakfast.   polyethylene glycol 17 g packet Commonly known as: MiraLax Take 17 g by mouth daily as needed for moderate  constipation.   potassium chloride 10 MEQ tablet Commonly known as: KLOR-CON Take 1 tablet (10 mEq total) by mouth daily.   Santyl ointment Generic drug: collagenase Apply 1 application topically daily.   senna-docusate 8.6-50 MG tablet Commonly known as: Senokot-S Take 1 tablet by mouth 2 (two) times daily as needed for mild constipation.   tamsulosin 0.4 MG Caps capsule Commonly known as: FLOMAX Take 1 capsule (0.4 mg total) by mouth 2 (two) times daily.   traMADol 50 MG tablet Commonly known as: ULTRAM Take 1 tablet (50 mg total) by mouth every 6 (six) hours as needed for moderate pain.   vitamin B-12 1000 MCG tablet Commonly known as: CYANOCOBALAMIN Take 1 tablet (1,000 mcg total) by mouth daily.   vitamin C 500 MG tablet Commonly known as: ASCORBIC ACID Take 500 mg by mouth daily.       Diagnostic Studies: Dg Chest 2 View  Result Date: 12/09/2018 CLINICAL DATA:  Follow-up from previous exam EXAM: CHEST - 2 VIEW COMPARISON:  12/08/2018 FINDINGS: Cardiac shadow is enlarged but stable. Aortic calcifications are again seen. Bibasilar effusions and atelectasis/infiltrate are again noted left slightly greater than right stable from recent exam. No acute bony abnormality is noted. IMPRESSION: Stable bibasilar effusions and atelectasis/infiltrate. Electronically Signed   By: Inez Catalina M.D.   On: 12/09/2018 19:25   Dg Chest Port 1 View  Result Date: 12/08/2018 CLINICAL DATA:  Leukocytosis. EXAM: PORTABLE CHEST 1 VIEW COMPARISON:  11/18/2018. FINDINGS: Stable cardiomegaly. Bibasilar pulmonary infiltrates/edema, progressed from prior exam. Bilateral moderate pleural effusions again noted. No pneumothorax. IMPRESSION: Stable cardiomegaly. Bibasilar pulmonary infiltrates/edema, progressed from prior exam. Progressive CHF and/or pneumonia could present this fashion. Bilateral moderate pleural effusions again noted without interim change. Electronically Signed   By: Marcello Moores  Register    On: 12/08/2018 09:46    Patient benefited maximally from their hospital stay and there were no complications.     Disposition: Discharge disposition: [facility]      Discharge Instructions    Call MD / Call 911   Complete by: As directed    If you experience chest pain or shortness of breath, CALL 911 and be transported to the hospital emergency room.  If you develope a fever above 101 F, pus (white drainage) or increased drainage or redness at the wound, or calf pain, call your surgeon's office.   Constipation Prevention   Complete by: As directed    Drink plenty of fluids.  Prune juice may be helpful.  You may use a stool softener, such as Colace (over the counter) 100 mg twice a day.  Use MiraLax (over the counter) for constipation as  needed.   Diet - low sodium heart healthy   Complete by: As directed    Increase activity slowly as tolerated   Complete by: As directed    Neg Press Wound Therapy / Incisional   Complete by: As directed    Show patient how to attach prevena pump. Will be removed in office at 1st post op     Follow-up Information    [name], MD In 1 week.   Specialty: Orthopedic Surgery Contact information: [address] [place] [address] [phone]            Signed: Bevely Palmer Dashon Mcintire 12/22/2018, 7:17 AM

## 2018-12-22 NOTE — Progress Notes (Signed)
[name], [name] ([id number]) Visit Report for 11/04/2018 Chief Complaint Document Details Patient Name: Date of Service: [name], [name] 11/04/2018 3:00 PM Medical Record [id number] Patient Account Number: [id number] Date of Birth/Sex: Treating RN: 04-11-1927 (85 y.o. Janyth Contes Primary Care Provider: [employer] Other Clinician: Referring Provider: Treating Provider/Extender:Stone III, Elaina Pattee, Moses Manners in Treatment: 0 Information Obtained from: Patient Chief Complaint Left foot and ankle ulcer Electronic Signature(s) Signed: 11/04/2018 6:13:43 PM By: Worthy Keeler PA-C Entered By: Worthy Keeler on 11/04/2018 16:44:39 -------------------------------------------------------------------------------- HPI Details Patient Name: Date of Service: [name], [name] 11/04/2018 3:00 PM Medical Record [id number] Patient Account Number: [id number] Date of Birth/Sex: Treating RN: 1927/04/30 (85 y.o. Janyth Contes Primary Care Provider: [employer] Other Clinician: Referring Provider: Treating Provider/Extender:Stone III, Elaina Pattee, Moses Manners in Treatment: 0 History of Present Illness HPI Description: ADMISSION 08/11/2018 This is an 85 year old man who arrives accompanied by his daughter who is his caregiver. Reasonably independent living in [place]. His problem started on April 23. He apparently clipped his toenails too close. He then saw podiatry who actually remove the nail. He developed ischemic gangrene of the toe and had multiple rounds of antibiotics. Arterial Dopplers performed on 07/21/2018 revealed an ABI of 0.43 with no vessel runoff. He underwent an angiogram by Dr. Gwenlyn Found on 07/27/2018 for critical limb ischemia. He had a previous stent placed in the left SFA which was widely patent. However there was high-grade calcific plaque in the distal left SFA and popliteal artery with 0 vessel runoff. On the right he had a subtotally occluded  calcified distal right SFA and above-knee popliteal artery with 0 vessel runoff. He did not have an identifiable tibial vessel. There were collaterals. On 08/03/2018 the patient had a successful diamond orbital rotational atherectomy balloon angioplasty followed by a drug-coated balloon angioplasty of a highly calcified segmental distal right SFA and above the knee popliteal artery. He had follow-up noninvasive arterial studies on 7/27. This showed an improvement of his ABI on the right from 0.43 2.61. Since all of this occurred he also traumatized his right lateral heel apparently from a staple from his recliner. He was being followed by therapy at the [facility] wound care clinic they have been applying Xeroform The patient arrives today with dry gangrene of the right great toe. This digit is certainly nonsalvageable. He has a fairly large but superficial area over the dorsal part of his right foot. He also has an ischemic-looking wound on the right lateral heel. Past medical history includes hypertension, paroxysmal atrial fibrillation, type 2 diabetes with PAD, diastolic congestive heart failure, aortic valve stenosis, hypothyroidism, less left SFA stent previously placed 8/4-Patient returns with worsening discoloration on the plantar aspect of his right great toe, dry gangrene of the right great toe, more pain and swelling in the forefoot, also discussed at length on the phone with Dr. Alvester Chou today regarding options for revascularization in the distal foot which were already discussed by Dr. Dellia Nims and him at the last visit Readmission: 11/04/2018 on evaluation today patient appears to be doing somewhat poorly in regard to his left lower extremity. He has since we last saw him had an amputation of the right lower extremity. On the left at this time he has a wound that is really right at the bridge between his ankle and foot more laterally and then he has a toe wound on the distal portion of  his left great toe which appears to really be more likely deep tissue  injury possibly from his shoes. Either way the main issue that I see at this point is that the patient seems to be having issues with these wounds healing he does have poor arterial flow in fact his most recent ABI on the left which was performed on 10/15/2018 showed that he had an ABI of 0.51. At this point Dr. Alvester Chou really does not feel like that it would be beneficial for this patient have any additional revascularization he is hopeful that maybe will be able to get this area to heal. With that being said there is no signs of of active infection at this time. Electronic Signature(s) Signed: 11/04/2018 6:13:43 PM By: Worthy Keeler PA-C Entered By: Worthy Keeler on 11/04/2018 17:11:41 -------------------------------------------------------------------------------- Physical Exam Details Patient Name: Date of Service: [name], [name] 11/04/2018 3:00 PM Medical Record [id number] Patient Account Number: [id number] Date of Birth/Sex: Treating RN: [birth year] (85 y.o. Janyth Contes Primary Care Provider: [employer] Other Clinician: Referring Provider: Treating Provider/Extender:Stone III, Elaina Pattee, JOHN Weeks in Treatment: 0 Constitutional patient is hypertensive.. pulse regular and within target range for patient.[name] respirations regular, non-labored and within target range for patient.[name] temperature within target range for patient.. Well-nourished and well-hydrated in no acute distress. Eyes conjunctiva clear no eyelid edema noted. pupils equal round and reactive to light and accommodation. Ears, Nose, Mouth, and Throat no gross abnormality of ear auricles or external auditory canals. normal hearing noted during conversation. mucus membranes moist. Respiratory normal breathing without difficulty. clear to auscultation bilaterally. Cardiovascular regular rate and rhythm with normal S1, S2. Absent  posterior tibial and dorsalis pedis pulses bilateral lower extremities. 1+ pitting edema of the bilateral lower extremities. Gastrointestinal (GI) soft, non-tender, non-distended, +BS. no ventral hernia noted. Musculoskeletal Patient unable to walk without assistance. Patient has a right below-knee amputation.[name] Psychiatric Patient is not able to cooperate in decision making regarding care. Patient is oriented to person only. pleasant and cooperative. Notes Upon inspection patient's wound did have some slough and eschar noted on the ankle ulcer in particular which I think could be amendable to Santyl. He also did have some issues based on what I am seeing today with the distal portion of his left great toe where he appears to have what I think is a deep tissue injury which has developed moved more into somewhat of an eschar I think at this point Betadine would be the ideal thing here at this location. Fortunately there is no signs of infection at either site at this point. The patient is seen with his son here in the office today. Unfortunately he continues to have poor arterial flow into this extremity. He is getting ready to have evaluation for a prosthesis for his right lower extremity in the next couple of weeks. Electronic Signature(s) Signed: 11/04/2018 6:13:43 PM By: Worthy Keeler PA-C Entered By: Worthy Keeler on 11/04/2018 17:12:55 -------------------------------------------------------------------------------- Physician Orders Details Patient Name: Date of Service: [name], [name] 11/04/2018 3:00 PM Medical Record [id number] Patient Account Number: [id number] Date of Birth/Sex: Treating RN: 1927-04-19 (85 y.o. Janyth Contes Primary Care Provider: [employer] Other Clinician: Referring Provider: Treating Provider/Extender:Stone III, Elaina Pattee, Moses Manners in Treatment: 0 Verbal / Phone Orders: No Diagnosis Coding ICD-10 Coding Code Description E11.621  Type 2 diabetes mellitus with foot ulcer L97.528 Non-pressure chronic ulcer of other part of left foot with other specified severity L97.328 Non-pressure chronic ulcer of left ankle with other specified severity I73.89 Other specified peripheral vascular diseases I10 Essential (  primary) hypertension Follow-up Appointments Return Appointment in 2 weeks. Dressing Change Frequency Wound #5 Left Toe Great Change dressing every day. Wound #6 Left,Lateral Ankle Change dressing every day. Wound Cleansing Wound #5 Left Toe Great Clean wound with Normal Saline. - or wound cleanser Wound #6 Left,Lateral Ankle Clean wound with Normal Saline. - or wound cleanser Primary Wound Dressing Wound #5 Left Toe Great Other: - Paint with Betadine Wound #6 Left,Lateral Ankle Santyl Ointment Secondary Dressing Wound #6 Left,Lateral Ankle Foam Border - or large bandaid Edema Control Elevate legs to the level of the heart or above for 30 minutes daily and/or when sitting, a frequency of: - throughout the day [facility] skilled nursing for wound care. - Advanced Patient Medications Allergies: Iodine and Iodide Containing Products, iohexol Notifications Medication Indication Start End Santyl 11/04/2018 DOSE topical 250 unit/gram ointment - ointment topical Apply nickel thick to the wound bed and then cover with a dressing as directed in clinic. Electronic Signature(s) Signed: 11/04/2018 5:14:45 PM By: Worthy Keeler PA-C Entered By: Worthy Keeler on 11/04/2018 17:14:45 -------------------------------------------------------------------------------- Problem List Details Patient Name: Date of Service: [name], [name] 11/04/2018 3:00 PM Medical Record [id number] Patient Account Number: [id number] Date of Birth/Sex: Treating RN: 1927-12-12 (85 y.o. Janyth Contes Primary Care Provider: [employer] Other Clinician: Referring Provider: Treating  Provider/Extender:Stone III, Elaina Pattee, Moses Manners in Treatment: 0 Active Problems ICD-10 Evaluated Encounter Code Description Active Date Today Diagnosis E11.621 Type 2 diabetes mellitus with foot ulcer 11/04/2018 No Yes L97.528 Non-pressure chronic ulcer of other part of left foot 11/04/2018 No Yes with other specified severity L97.328 Non-pressure chronic ulcer of left ankle with other 11/04/2018 No Yes specified severity I73.89 Other specified peripheral vascular diseases 11/04/2018 No Yes I10 Essential (primary) hypertension 11/04/2018 No Yes Inactive Problems Resolved Problems Electronic Signature(s) Signed: 11/04/2018 6:13:43 PM By: Worthy Keeler PA-C Entered By: Worthy Keeler on 11/04/2018 16:49:58 -------------------------------------------------------------------------------- Progress Note Details Patient Name: Date of Service: [name]. 11/04/2018 3:00 PM Medical Record [id number] Patient Account Number: [id number] Date of Birth/Sex: Treating RN: 17-Jul-1927 (85 y.o. Janyth Contes Primary Care Provider: [employer] Other Clinician: Referring Provider: Treating Provider/Extender:Stone III, Elaina Pattee, Moses Manners in Treatment: 0 Subjective Chief Complaint Information obtained from Patient Left foot and ankle ulcer History of Present Illness (HPI) ADMISSION 08/11/2018 This is an 85 year old man who arrives accompanied by his daughter who is his caregiver. Reasonably independent living in [place]. His problem started on April 23. He apparently clipped his toenails too close. He then saw podiatry who actually remove the nail. He developed ischemic gangrene of the toe and had multiple rounds of antibiotics. Arterial Dopplers performed on 07/21/2018 revealed an ABI of 0.43 with no vessel runoff. He underwent an angiogram by Dr. Gwenlyn Found on 07/27/2018 for critical limb ischemia. He had a previous stent placed in the left SFA which was widely  patent. However there was high-grade calcific plaque in the distal left SFA and popliteal artery with 0 vessel runoff. On the right he had a subtotally occluded calcified distal right SFA and above-knee popliteal artery with 0 vessel runoff. He did not have an identifiable tibial vessel. There were collaterals. On 08/03/2018 the patient had a successful diamond orbital rotational atherectomy balloon angioplasty followed by a drug-coated balloon angioplasty of a highly calcified segmental distal right SFA and above the knee popliteal artery. He had follow-up noninvasive arterial studies on 7/27. This showed an improvement of his ABI on the right  from 0.43 2.61. Since all of this occurred he also traumatized his right lateral heel apparently from a staple from his recliner. He was being followed by therapy at the [facility] wound care clinic they have been applying Xeroform The patient arrives today with dry gangrene of the right great toe. This digit is certainly nonsalvageable. He has a fairly large but superficial area over the dorsal part of his right foot. He also has an ischemic-looking wound on the right lateral heel. Past medical history includes hypertension, paroxysmal atrial fibrillation, type 2 diabetes with PAD, diastolic congestive heart failure, aortic valve stenosis, hypothyroidism, less left SFA stent previously placed 8/4-Patient returns with worsening discoloration on the plantar aspect of his right great toe, dry gangrene of the right great toe, more pain and swelling in the forefoot, also discussed at length on the phone with Dr. Alvester Chou today regarding options for revascularization in the distal foot which were already discussed by Dr. Dellia Nims and him at the last visit Readmission: 11/04/2018 on evaluation today patient appears to be doing somewhat poorly in regard to his left lower extremity. He has since we last saw him had an amputation of the right lower extremity. On the left  at this time he has a wound that is really right at the bridge between his ankle and foot more laterally and then he has a toe wound on the distal portion of his left great toe which appears to really be more likely deep tissue injury possibly from his shoes. Either way the main issue that I see at this point is that the patient seems to be having issues with these wounds healing he does have poor arterial flow in fact his most recent ABI on the left which was performed on 10/15/2018 showed that he had an ABI of 0.51. At this point Dr. Alvester Chou really does not feel like that it would be beneficial for this patient have any additional revascularization he is hopeful that maybe will be able to get this area to heal. With that being said there is no signs of of active infection at this time. Patient History Information obtained from Patient. Allergies Iodine and Iodide Containing Products (Severity: Severe, Reaction: rash/fever), iohexol (Severity: Severe, Reaction: rash) Family History Cancer - Mother, Heart Disease - Father, Hypertension - Father, Stroke - Father, No family history of Diabetes, Hereditary Spherocytosis, Kidney Disease, Lung Disease, Seizures, Thyroid Problems, Tuberculosis. Social History Former smoker - quit over 50 yrs ago, Marital Status - Widowed, Alcohol Use - Never, Drug Use - No History, Caffeine Use - Daily - coffee. Medical History Eyes Patient has history of Cataracts - bil removed Denies history of Glaucoma, Optic Neuritis Respiratory Patient has history of Sleep Apnea - no CPAP Cardiovascular Patient has history of Arrhythmia - afib, Congestive Heart Failure, Hypertension, Peripheral Arterial Disease Endocrine Patient has history of Type II Diabetes Denies history of Type I Diabetes Integumentary (Skin) Denies history of History of Burn Musculoskeletal Patient has history of Osteoarthritis Denies history of Gout, Rheumatoid Arthritis,  Osteomyelitis Neurologic Patient has history of Neuropathy Denies history of Dementia, Quadriplegia, Paraplegia, Seizure Disorder Psychiatric Denies history of Anorexia/bulimia, Confinement Anxiety Hospitalization/Surgery History - angiogram. - left knee surgery. - hernia repair. Medical And Surgical History Notes Cardiovascular aortic stenosis, RBBB, hyperlipidemia Gastrointestinal GERD Endocrine hypothyroidism Genitourinary BPH Review of Systems (ROS) Constitutional Symptoms (General Health) Denies complaints or symptoms of Fatigue, Fever, Chills, Marked Weight Change. Eyes Complains or has symptoms of Glasses / Contacts. Denies complaints or symptoms  of Dry Eyes, Vision Changes. Ear/Nose/Mouth/Throat Denies complaints or symptoms of Chronic sinus problems or rhinitis. Respiratory Denies complaints or symptoms of Chronic or frequent coughs, Shortness of Breath. Cardiovascular Denies complaints or symptoms of Chest pain. Gastrointestinal Denies complaints or symptoms of Frequent diarrhea, Nausea, Vomiting. Endocrine Denies complaints or symptoms of Heat/cold intolerance. Genitourinary Denies complaints or symptoms of Frequent urination. Integumentary (Skin) Complains or has symptoms of Wounds. Musculoskeletal Denies complaints or symptoms of Muscle Pain, Muscle Weakness. Neurologic Denies complaints or symptoms of Numbness/parasthesias. Psychiatric Denies complaints or symptoms of Claustrophobia, Suicidal. Objective Constitutional patient is hypertensive.. pulse regular and within target range for patient.[name] respirations regular, non-labored and within target range for patient.[name] temperature within target range for patient.. Well-nourished and well-hydrated in no acute distress. Vitals Time Taken: 3:43 PM, Height: 71 in, Source: Stated, Weight: 220 lbs, Source: Stated, BMI: 30.7, Temperature: 98.1 F, Pulse: 55 bpm, Respiratory Rate: 18 breaths/min, Blood Pressure:  154/63 mmHg. Eyes conjunctiva clear no eyelid edema noted. pupils equal round and reactive to light and accommodation. Ears, Nose, Mouth, and Throat no gross abnormality of ear auricles or external auditory canals. normal hearing noted during conversation. mucus membranes moist. Respiratory normal breathing without difficulty. clear to auscultation bilaterally. Cardiovascular regular rate and rhythm with normal S1, S2. Absent posterior tibial and dorsalis pedis pulses bilateral lower extremities. 1+ pitting edema of the bilateral lower extremities. Gastrointestinal (GI) soft, non-tender, non-distended, +BS. no ventral hernia noted. Musculoskeletal Patient unable to walk without assistance. Patient has a right below-knee amputation.[name] Psychiatric Patient is not able to cooperate in decision making regarding care. Patient is oriented to person only. pleasant and cooperative. General Notes: Upon inspection patient's wound did have some slough and eschar noted on the ankle ulcer in particular which I think could be amendable to Santyl. He also did have some issues based on what I am seeing today with the distal portion of his left great toe where he appears to have what I think is a deep tissue injury which has developed moved more into somewhat of an eschar I think at this point Betadine would be the ideal thing here at this location. Fortunately there is no signs of infection at either site at this point. The patient is seen with his son here in the office today. Unfortunately he continues to have poor arterial flow into this extremity. He is getting ready to have evaluation for a prosthesis for his right lower extremity in the next couple of weeks. Integumentary (Hair, Skin) Wound #5 status is Open. Original cause of wound was Blister. The wound is located on the Left Toe Great. The wound measures 1cm length x 0.6cm width x 0.1cm depth; 0.471cm^2 area and 0.047cm^3 volume. There is  no tunneling or undermining noted. There is a none present amount of drainage noted. There is no granulation within the wound bed. There is a large (67-100%) amount of necrotic tissue within the wound bed including Eschar and Adherent Slough. Wound #6 status is Open. Original cause of wound was Blister. The wound is located on the Left,Lateral Ankle. The wound measures 0.5cm length x 1.5cm width x 0.1cm depth; 0.589cm^2 area and 0.059cm^3 volume. There is a none present amount of drainage noted. There is no granulation within the wound bed. There is a large (67-100%) amount of necrotic tissue within the wound bed including Eschar and Adherent Slough. Assessment Active Problems ICD-10 Type 2 diabetes mellitus with foot ulcer Non-pressure chronic ulcer of other part of left foot with other  specified severity Non-pressure chronic ulcer of left ankle with other specified severity Other specified peripheral vascular diseases Essential (primary) hypertension Plan Follow-up Appointments: Return Appointment in 2 weeks. Dressing Change Frequency: Wound #5 Left Toe Great: Change dressing every day. Wound #6 Left,Lateral Ankle: Change dressing every day. Wound Cleansing: Wound #5 Left Toe Great: Clean wound with Normal Saline. - or wound cleanser Wound #6 Left,Lateral Ankle: Clean wound with Normal Saline. - or wound cleanser Primary Wound Dressing: Wound #5 Left Toe Great: Other: - Paint with Betadine Wound #6 Left,Lateral Ankle: Santyl Ointment Secondary Dressing: Wound #6 Left,Lateral Ankle: Foam Border - or large bandaid Edema Control: Elevate legs to the level of the heart or above for 30 minutes daily and/or when sitting, a frequency of: - throughout the day Home Health: [place] skilled nursing for wound care. - Advanced The following medication(s) was prescribed: Santyl topical 250 unit/gram ointment ointment topical Apply nickel thick to the wound bed and then  cover with a dressing as directed in clinic. starting 11/04/2018 1. My suggestion at this time is that we go ahead and initiate treatment with Santyl for the ankle ulcer at this point 2. We are getting use Betadine on the tip of his great toe he has an allergy in the chart for iodine but nonetheless has used Betadine in the past without any complication I think that was more of a contrast dye issue that he had in the past based on what his son is telling me today. 3. I would recommend continued appropriate offloading even when he puts the compression stocking on I explained to his son he wants to make sure to leave enough room in the in for the patient's toes without causing too much pressure at that site. 4. I do recommend elevating the legs as much as possible due to the amount of edema that he has and he will continue to wear the compression stocking which Dr. Alvester Chou has apparently approved of as well patient's son states that Dr. Alvester Chou is aware that they are using the stocking. We will see patient back for reevaluation in 1 week here in the clinic. If anything worsens or changes patient will contact our office for additional recommendations. Electronic Signature(s) Signed: 11/04/2018 6:13:43 PM By: Worthy Keeler PA-C Entered By: Worthy Keeler on 11/04/2018 17:15:47 -------------------------------------------------------------------------------- HxROS Details Patient Name: Date of Service: [name], [name] 11/04/2018 3:00 PM Medical Record [id number] Patient Account Number: [id number] Date of Birth/Sex: Treating RN: 06-15-1927 (85 y.o. [name]) Carlene Coria Primary Care Provider: [employer] Other Clinician: Referring Provider: Treating Provider/Extender:Stone III, Elaina Pattee, JOHN Weeks in Treatment: 0 Information Obtained From Patient Constitutional Symptoms (General Health) Complaints and Symptoms: Negative for: Fatigue; Fever; Chills; Marked Weight  Change Eyes Complaints and Symptoms: Positive for: Glasses / Contacts Negative for: Dry Eyes; Vision Changes Medical History: Positive for: Cataracts - bil removed Negative for: Glaucoma; Optic Neuritis Ear/Nose/Mouth/Throat Complaints and Symptoms: Negative for: Chronic sinus problems or rhinitis Respiratory Complaints and Symptoms: Negative for: Chronic or frequent coughs; Shortness of Breath Medical History: Positive for: Sleep Apnea - no CPAP Cardiovascular Complaints and Symptoms: Negative for: Chest pain Medical History: Positive for: Arrhythmia - afib; Congestive Heart Failure; Hypertension; Peripheral Arterial Disease Past Medical History Notes: aortic stenosis, RBBB, hyperlipidemia Gastrointestinal Complaints and Symptoms: Negative for: Frequent diarrhea; Nausea; Vomiting Medical History: Past Medical History Notes: GERD Endocrine Complaints and Symptoms: Negative for: Heat/cold intolerance Medical History: Positive for: Type II Diabetes Negative for: Type I Diabetes  Past Medical History Notes: hypothyroidism Time with diabetes: 10 Treated with: Oral agents Blood sugar tested every day: No Genitourinary Complaints and Symptoms: Negative for: Frequent urination Medical History: Past Medical History Notes: BPH Integumentary (Skin) Complaints and Symptoms: Positive for: Wounds Medical History: Negative for: History of Burn Musculoskeletal Complaints and Symptoms: Negative for: Muscle Pain; Muscle Weakness Medical History: Positive for: Osteoarthritis Negative for: Gout; Rheumatoid Arthritis; Osteomyelitis Neurologic Complaints and Symptoms: Negative for: Numbness/parasthesias Medical History: Positive for: Neuropathy Negative for: Dementia; Quadriplegia; Paraplegia; Seizure Disorder Psychiatric Complaints and Symptoms: Negative for: Claustrophobia; Suicidal Medical History: Negative for: Anorexia/bulimia; Confinement  Anxiety Hematologic/Lymphatic Immunological Oncologic HBO Extended History Items Eyes: Cataracts Immunizations Pneumococcal Vaccine: Received Pneumococcal Vaccination: Yes Implantable Devices Yes Hospitalization / Surgery History Type of Hospitalization/Surgery angiogram left knee surgery hernia repair Family and Social History Cancer: Yes - Mother; Diabetes: No; Heart Disease: Yes - Father; Hereditary Spherocytosis: No; Hypertension: Yes - Father; Kidney Disease: No; Lung Disease: No; Seizures: No; Stroke: Yes - Father; Thyroid Problems: No; Tuberculosis: No; Former smoker - quit over 50 yrs ago; Marital Status - Widowed; Alcohol Use: Never; Drug Use: No History; Caffeine Use: Daily - coffee; Financial Concerns: No; Food, Clothing or Shelter Needs: No; Support System Lacking: No; Transportation Concerns: No Electronic Signature(s) Signed: 11/04/2018 6:13:43 PM By: Worthy Keeler PA-C Signed: 12/22/2018 3:01:15 PM By: Carlene Coria RN Entered By: Carlene Coria on 11/04/2018 16:26:55 -------------------------------------------------------------------------------- SuperBill Details Patient Name: Date of Service: [name] 11/04/2018 Medical Record [id number] Patient Account Number: [id number] Date of Birth/Sex: Treating RN: 1927/12/05 (85 y.o. Janyth Contes Primary Care Provider: Redmond [employer] Other Clinician: Referring Provider: Treating Provider/Extender:Stone III, Elaina Pattee, Moses Manners in Treatment: 0 Diagnosis Coding ICD-10 Codes Code Description E11.621 Type 2 diabetes mellitus with foot ulcer L97.528 Non-pressure chronic ulcer of other part of left foot with other specified severity L97.328 Non-pressure chronic ulcer of left ankle with other specified severity I73.89 Other specified peripheral vascular diseases I10 Essential (primary) hypertension Facility Procedures CPT4 Code: [id number] Description: [phone] - WOUND CARE VISIT-LEV 5 EST  PT Modifier: Quantity: 1 Physician Procedures CPT4 Code Description: [id number] - WC PHYS LEVEL 4 - EST PT ICD-10 Diagnosis Description E11.621 Type 2 diabetes mellitus with foot ulcer L97.528 Non-pressure chronic ulcer of other part of left foot wi L97.328 Non-pressure chronic ulcer of left  ankle with other spec I73.89 Other specified peripheral vascular diseases Modifier: th other specifie ified severity Quantity: 1 d severity Electronic Signature(s) Signed: 11/04/2018 6:50:57 PM By: Levan Hurst RN, BSN Signed: 11/18/2018 6:48:11 PM By: Worthy Keeler PA-C Previous Signature: 11/04/2018 6:13:43 PM Version By: Worthy Keeler PA-C Entered By: Levan Hurst on 11/04/2018 18:50:22

## 2018-12-22 NOTE — Progress Notes (Signed)
Physical Therapy Treatment Patient Details Name: [name] MRN: [id number] DOB: 04/10/1927 Today's Date: 12/22/2018    History of Present Illness Pt is an 85 y/o male admitted from CIR for planned L transtibial amputation. PMH including but not limited to R transtibial amputation in August of 2020, L transmetatarsal amputation on 12/02/18, DM, HTN, PVD.    PT Comments    Continuing work on functional mobility and activity tolerance;  Session focused on work on transfers, trunk control and mobiltiy and scooting , as well as transfers from the chair; Continue to recommend comprehensive inpatient rehab (CIR) for post-acute therapy needs.   Follow Up Recommendations  CIR;Supervision/Assistance - 24 hour     Equipment Recommendations  None recommended by PT    Recommendations for Other Services       Precautions / Restrictions Precautions Precautions: Fall Precaution Comments: prior R transtibial amputation; wound VAC to L residual limb Required Braces or Orthoses: Other Brace Other Brace: Limbguard LLE Restrictions LLE Weight Bearing: Non weight bearing Other Position/Activity Restrictions: R BK amp is healed; Prosthesis is new; he is on a wearing/breaking in schedule now: 2 hours in the am, and 2 hours in the pm today, and adding 30 minutes to wearing sessions each day    Mobility  Bed Mobility Overal bed mobility: Needs Assistance Bed Mobility: Supine to Sit     Supine to sit: Mod assist;+2 for physical assistance     General bed mobility comments: Heavy mod assist to rotate and pull to upright sitting; Mod assist to rotate hips in prep for anterior posterior transfer  Transfers Overall transfer level: Needs assistance Equipment used: Rolling walker (2 wheeled) Transfers: Anterior-Posterior Transfer Sit to Stand: +2 physical assistance;Max assist     Anterior-Posterior transfers: Max assist;+2 safety/equipment   General transfer comment: Needed lots of cues and  assist to sit fully upright for backwards scooting; max assist and use of pad to slide backwards into chair; Once in chair, worked on sit to stand to [employer] and to stedy onto R single limb stance on R prosthesis; Noting difficulty getting r foot closer to his base of support due to limits of knee flexion in prosthesis; Lots of difficulty leaning forward and powering up; did not stand successfully with +2 assist  Ambulation/Gait                 Stairs             Wheelchair Mobility    Modified Rankin (Stroke Patients Only)       Balance     Sitting balance-Leahy Scale: Poor Sitting balance - Comments: Lots of work on upright sitting with ant post transfer and work in [employer]; Able to pull self from supported sitting  to unsupported sitting in recliner using armrests                                    Cognition Arousal/Alertness: Awake/alert Behavior During Therapy: WFL for tasks assessed/performed Overall Cognitive Status: Within Functional Limits for tasks assessed(for simple mobility tasks)                               Problem Solving: Slow processing;Requires verbal cues;Requires tactile cues        Exercises      General Comments        Pertinent Vitals/Pain Pain  Assessment: Faces Faces Pain Scale: Hurts little more Pain Location: L residual limb Pain Descriptors / Indicators: Sore;Grimacing Pain Intervention(s): Monitored during session    Home Living                      Prior Function            PT Goals (current goals can now be found in the care plan section) Acute Rehab PT Goals Patient Stated Goal: to get back to rehab and then eventually home PT Goal Formulation: With patient/family Time For Goal Achievement: 01/02/19 Potential to Achieve Goals: Fair Progress towards PT goals: Progressing toward goals    Frequency    Min 5X/week      PT Plan Current plan remains appropriate     Co-evaluation PT/OT/SLP Co-Evaluation/Treatment: Yes Reason for Co-Treatment: For patient/therapist safety;To address functional/ADL transfers PT goals addressed during session: Mobility/safety with mobility        AM-PAC PT "6 Clicks" Mobility   Outcome Measure  Help needed turning from your back to your side while in a flat bed without using bedrails?: A Lot Help needed moving from lying on your back to sitting on the side of a flat bed without using bedrails?: A Lot Help needed moving to and from a bed to a chair (including a wheelchair)?: A Lot Help needed standing up from a chair using your arms (e.g., wheelchair or bedside chair)?: Total Help needed to walk in hospital room?: Total Help needed climbing 3-5 steps with a railing? : Total 6 Click Score: 9    End of Session Equipment Utilized During Treatment: Gait belt Activity Tolerance: Patient tolerated treatment well Patient left: in chair;with call bell/phone within reach;with chair alarm set Nurse Communication: Mobility status;Need for lift equipment(Maximove for back to bed) PT Visit Diagnosis: Other abnormalities of gait and mobility (R26.89) Pain - Right/Left: Left Pain - part of body: Leg     Time: 1014-1050 PT Time Calculation (min) (ACUTE ONLY): 36 min  Charges:  $Therapeutic Activity: 8-22 mins                     Roney Marion, PT  Acute Rehabilitation Services Pager [phone] Office [place] 12/22/2018, 2:16 PM

## 2018-12-22 NOTE — TOC Initial Note (Signed)
Transition of Care [facility]) - Initial/Assessment Note    Patient Details  Name: [name] MRN: [id number] Date of Birth: 1927-04-12  Transition of Care [facility]) CM/SW Contact:    Midge Minium MSN, RN, NCM-BC, ACM-RN [phone] Phone Number: 12/22/2018, 3:32 PM  Clinical Narrative:                 CM following for transitional needs. Patient is an 85 y/o male admitted from CIR for planned L transtibial amputation. PMH including but not limited to R transtibial amputation in August of 2020, L transmetatarsal amputation on 12/02/18, DM, HTN, PVD. PT/OT eval completed with CIR recommended with the Rehab [facility] following. CM team will continue to follow.   Expected Discharge Plan: IP Rehab Facility Barriers to Discharge: Insurance Authorization  Expected Discharge Plan and Services Expected Discharge Plan: [place] In-house Referral: Clinical Social Work Discharge Planning Services: CM Consult Post Acute Care Choice: [place] arrangements for the past 2 months: Single Family Home Expected Discharge Date: 12/22/18               DME Arranged: N/A DME Agency: NA       HH Arranged: NA HH Agency: NA        Prior Living Arrangements/Services Living arrangements for the past 2 months: Single Family Home Lives with:: Adult Children, Self              Current home services: DME, Home PT, Home RN    Activities of Daily Living Home Assistive Devices/Equipment: Hospital bed, Wheelchair, Bedside commode/3-in-1(PROSTETIC LEG) ADL Screening (condition at time of admission) Patient's cognitive ability adequate to safely complete daily activities?: No Is the patient deaf or have difficulty hearing?: No Does the patient have difficulty seeing, even when wearing glasses/contacts?: No Does the patient have difficulty concentrating, remembering, or making decisions?: No Patient able to express need for assistance with ADLs?: Yes Does the patient have difficulty dressing or  bathing?: No Independently performs ADLs?: Yes (appropriate for developmental age) Communication: Independent Is this a change from baseline?: Pre-admission baseline Dressing (OT): Needs assistance Is this a change from baseline?: Pre-admission baseline Grooming: Needs assistance Is this a change from baseline?: Pre-admission baseline Feeding: Needs assistance Is this a change from baseline?: Pre-admission baseline Bathing: Needs assistance Is this a change from baseline?: Pre-admission baseline Toileting: Needs assistance Is this a change from baseline?: Pre-admission baseline In/Out Bed: Needs assistance Is this a change from baseline?: Pre-admission baseline Walks in Home: Dependent Is this a change from baseline?: Pre-admission baseline Does the patient have difficulty walking or climbing stairs?: Yes Weakness of Legs: Both(BILATERAL AMPUTEE) Weakness of Arms/Hands: None   Admission diagnosis:  Gangrene of left foot [facility]) [I96] Patient Active Problem List   Diagnosis Date Noted  . Dehiscence of amputation stump ([place])   . History of transmetatarsal amputation of left foot ([place]) 12/09/2018  . Critical limb ischemia with history of revascularization of same extremity   . Gangrene of left foot ([place])   . Diabetic ulcer of toe of left foot associated with type 2 diabetes mellitus ([place])   . Cellulitis of left lower extremity   . Toe infection 11/18/2018  . Peripheral edema   . Acute on chronic diastolic CHF (congestive heart failure) ([place]) 11/09/2018  . S/P BKA (below knee amputation) unilateral, right ([place]) 10/05/2018  . Hyponatremia   . Essential hypertension   . Postoperative pain   . Pain of left heel   . Unable to maintain weight-bearing   .  Type 2 diabetes mellitus with diabetic peripheral angiopathy with gangrene ([place])   . Anemia due to acute blood loss   . Stage 3 chronic kidney disease   . Acquired absence of right leg below knee ([place]) 09/07/2018  . Gangrene of  right foot ([place])   . DNR (do not resuscitate) discussion   . Goals of care, counseling/discussion   . Palliative care by specialist   . Type 2 diabetes mellitus with foot ulcer and gangrene ([place])   . Severe protein-calorie malnutrition ([place])   . Ischemic foot 08/20/2018  . Critical lower limb ischemia 08/03/2018  . Type 2 diabetes mellitus with foot ulcer, without long-term current use of insulin ([place])   . Gastroesophageal reflux disease   . Cellulitis of great toe of right foot 05/29/2018  . Atrial fibrillation, chronic   . Chest pain 07/25/2017  . PAF (paroxysmal atrial fibrillation) ([place]) 07/25/2017  . BPH (benign prostatic hyperplasia) 07/25/2017  . Shortness of breath 05/11/2017  . Hypothyroidism 05/11/2017  . Chronic diastolic CHF (congestive heart failure) ([place]) 05/11/2017  . Moderate aortic stenosis 06/12/2016  . RBBB 06/12/2016  . Peripheral vascular disease ([place]) 08/04/2012  . Essential hypertension, benign 08/04/2012  . Hyperlipidemia 08/04/2012  . Dizziness 08/04/2012   PCP:  Redmond School, MD Pharmacy:   [facility] [place], [place] AT [place] [address] [place] [address] Phone: [phone] Fax: [phone]  [facility] [place], [place] - [address] [place] [address] Phone: [phone] Fax: [phone]     Social Determinants of Health (SDOH) Interventions    Readmission Risk Interventions Readmission Risk Prevention Plan 11/26/2018 11/21/2018 11/12/2018  Transportation Screening Complete Complete -  PCP or Specialist Appt within 3-5 Days - - -  Not Complete comments - - -  HRI or [place] or Home Care Consult comments - - -  Social Work Consult for [place] Planning/Counseling - - -  Palliative Care Screening - - -  Medication Review (RN Care Manager) Complete Referral to Pharmacy -  PCP or  Specialist appointment within 3-5 days of discharge Complete Complete Complete  HRI or Home Care Consult Complete Complete -  SW Recovery Care/Counseling Consult Complete Complete -  Palliative Care Screening Not Applicable Not Applicable -  [place] Not Applicable Not Complete Not Applicable  Some recent data might be hidden

## 2018-12-22 NOTE — Plan of Care (Signed)
  Problem: Education: Goal: Knowledge of General Education information will improve Description: Including pain rating scale, medication(s)/side effects and non-pharmacologic comfort measures Outcome: Progressing   Problem: Clinical Measurements: Goal: Will remain free from infection Outcome: Progressing   Problem: Activity: Goal: Risk for activity intolerance will decrease Outcome: Progressing   Problem: Nutrition: Goal: Adequate nutrition will be maintained Outcome: Progressing   Problem: Coping: Goal: Level of anxiety will decrease Outcome: Progressing   Problem: Pain Managment: Goal: General experience of comfort will improve Outcome: Progressing   Problem: Safety: Goal: Ability to remain free from injury will improve Outcome: Progressing   Problem: Skin Integrity: Goal: Risk for impaired skin integrity will decrease Outcome: Progressing   

## 2018-12-22 NOTE — Progress Notes (Signed)
[name], [name] ([id number]) Visit Report for 11/04/2018 Allergy List Details Patient Name: Date of Service: [name], [name] 11/04/2018 3:00 PM Medical Record [id number] Patient Account Number: [id number] Date of Birth/Sex: Treating RN: [birth year] (85 y.o. [name]) Carlene Coria Primary Care Future Yeldell: [employer] Other Clinician: Referring Navpreet Szczygiel: Treating Donnica Jarnagin/Extender:Stone III, Elaina Pattee, JOHN Weeks in Treatment: 0 Allergies Active Allergies Iodine and Iodide Containing Products Reaction: rash/fever Severity: Severe iohexol Reaction: rash Severity: Severe Allergy Notes Electronic Signature(s) Signed: 12/22/2018 3:01:15 PM By: Carlene Coria RN Entered By: Carlene Coria on 11/04/2018 16:23:15 -------------------------------------------------------------------------------- Arrival Information Details Patient Name: Date of Service: [name]. 11/04/2018 3:00 PM Medical Record [id number] Patient Account Number: [id number] Date of Birth/Sex: Treating RN: [birth year] (85 y.o. [name] Primary Care Romain Erion: [employer] Other Clinician: Referring Anyela Napierkowski: Treating Gerldine Suleiman/Extender:Stone III, Elaina Pattee, Moses Manners in Treatment: 0 Visit Information Patient Arrived: Walker Arrival Time: 15:40 Accompanied By: son Transfer Assistance: None Patient Identification Verified: Yes Secondary Verification Process Yes Completed: Completed: Patient Has Alerts: Yes Patient Alerts: L ABI = 0.51 History Since Last Visit Added or deleted any medications: No Any new allergies or adverse reactions: No Had a fall or experienced change in activities of daily living that may affect risk of falls: No Signs or symptoms of abuse/neglect since last visito No Hospitalized since last visit: No Implantable device outside of the clinic excluding cellular tissue based products placed in the center since last visit: No Has Dressing in Place as Prescribed:  Yes Pain Present Now: No Electronic Signature(s) Signed: 11/04/2018 6:50:57 PM By: Levan Hurst RN, BSN Entered By: Levan Hurst on 11/04/2018 16:52:16 -------------------------------------------------------------------------------- Clinic Level of Care Assessment Details Patient Name: Date of Service: FRANSISCO, MESSMER 11/04/2018 3:00 PM Medical Record [id number] Patient Account Number: [id number] Date of Birth/Sex: Treating RN: [birth year] (85 y.o. [name] Primary Care Daylyn Christine: [employer] Other Clinician: Referring Emilya Justen: Treating Ronan Duecker/Extender:Stone III, Elaina Pattee, JOHN Weeks in Treatment: 0 Clinic Level of Care Assessment Items TOOL 2 Quantity Score X - Use when only an EandM is performed on the INITIAL visit 1 0 ASSESSMENTS - Nursing Assessment / Reassessment X - General Physical Exam (combine w/ comprehensive assessment (listed just below) 1 20 when performed on new pt. evals) X - Comprehensive Assessment (HX, ROS, Risk Assessments, Wounds Hx, etc.) 1 25 ASSESSMENTS - Wound and Skin Assessment / Reassessment []  - Simple Wound Assessment / Reassessment - one wound 0 X - Complex Wound Assessment / Reassessment - multiple wounds 2 5 []  - Dermatologic / Skin Assessment (not related to wound area) 0 ASSESSMENTS - Ostomy and/or Continence Assessment and Care []  - Incontinence Assessment and Management 0 []  - Ostomy Care Assessment and Management (repouching, etc.) 0 PROCESS - Coordination of Care X - Simple Patient / Family Education for ongoing care 1 15 []  - Complex (extensive) Patient / Family Education for ongoing care 0 X - Staff obtains Programmer, systems, Records, Test Results / Process Orders 1 10 X - Staff telephones HHA, Nursing Homes / Clarify orders / etc 1 10 []  - Routine Transfer to another Facility (non-emergent condition) 0 []  - Routine Hospital Admission (non-emergent condition) 0 X - New Admissions / Biomedical engineer / Ordering  NPWT, Apligraf, etc. 1 15 []  - Emergency Hospital Admission (emergent condition) 0 X - Simple Discharge Coordination 1 10 []  - Complex (extensive) Discharge Coordination 0 PROCESS - Special Needs []  - Pediatric / Minor Patient Management 0 []  - Isolation Patient Management 0 []  - Hearing / Language /  Visual special needs 0 []  - Assessment of Community assistance (transportation, D/C planning, etc.) 0 []  - Additional assistance / Altered mentation 0 []  - Support Surface(s) Assessment (bed, cushion, seat, etc.) 0 INTERVENTIONS - Wound Cleansing / Measurement X - Wound Imaging (photographs - any number of wounds) 1 5 []  - Wound Tracing (instead of photographs) 0 []  - Simple Wound Measurement - one wound 0 X - Complex Wound Measurement - multiple wounds 2 5 []  - Simple Wound Cleansing - one wound 0 X - Complex Wound Cleansing - multiple wounds 2 5 INTERVENTIONS - Wound Dressings X - Small Wound Dressing one or multiple wounds 2 10 []  - Medium Wound Dressing one or multiple wounds 0 []  - Large Wound Dressing one or multiple wounds 0 []  - Application of Medications - injection 0 INTERVENTIONS - Miscellaneous []  - External ear exam 0 []  - Specimen Collection (cultures, biopsies, blood, body fluids, etc.) 0 []  - Specimen(s) / Culture(s) sent or taken to Lab for analysis 0 []  - Patient Transfer (multiple staff / Civil Service fast streamer / Similar devices) 0 []  - Simple Staple / Suture removal (25 or less) 0 []  - Complex Staple / Suture removal (26 or more) 0 []  - Hypo / Hyperglycemic Management (close monitor of Blood Glucose) 0 []  - Ankle / Brachial Index (ABI) - do not check if billed separately 0 Has the patient been seen at the hospital within the last three years: Yes Total Score: 160 Level Of Care: New/Established - Level 5 Electronic Signature(s) Signed: 11/04/2018 6:50:57 PM By: Levan Hurst RN, BSN Entered By: Levan Hurst on 11/04/2018  18:50:12 -------------------------------------------------------------------------------- Encounter Discharge Information Details Patient Name: Date of Service: [name] 11/04/2018 3:00 PM Medical Record [id number] Patient Account Number: [id number] Date of Birth/Sex: Treating RN: 1927-06-08 (85 y.o. [name]) Carlene Coria Primary Care Kendi Defalco: [employer] Other Clinician: Referring Theodora Lalanne: Treating Sibbie Flammia/Extender:Stone III, Elaina Pattee, JOHN Weeks in Treatment: 0 Encounter Discharge Information Items Discharge Condition: Stable Ambulatory Status: Wheelchair Discharge Destination: Home Transportation: Private Auto Accompanied By: son Schedule Follow-up Appointment: Yes Clinical Summary of Care: Patient Declined Electronic Signature(s) Signed: 12/22/2018 3:01:15 PM By: Carlene Coria RN Entered By: Carlene Coria on 11/04/2018 17:18:14 -------------------------------------------------------------------------------- Lower Extremity Assessment Details Patient Name: Date of Service: [name], [name] 11/04/2018 3:00 PM Medical Record [id number] Patient Account Number: [id number] Date of Birth/Sex: Treating RN: [birth year] (85 y.o. [name]) Carlene Coria Primary Care Melana Hingle: [employer] Other Clinician: Referring Tymarion Everard: Treating Devaris Quirk/Extender:Stone III, Elaina Pattee, JOHN Weeks in Treatment: 0 Edema Assessment Assessed: [Left: No] [Right: No] E[Left: dema] [Right: :] Calf Left: Right: Point of Measurement: 38 cm From Medial Instep 38 cm cm Ankle Left: Right: Point of Measurement: 9 cm From Medial Instep 28 cm cm Notes right .51 Electronic Signature(s) Signed: 12/22/2018 3:01:15 PM By: Carlene Coria RN Entered By: Carlene Coria on 11/04/2018 16:33:54 -------------------------------------------------------------------------------- Multi-Disciplinary Care Plan Details Patient Name: Date of Service: [name], [name] 11/04/2018 3:00 PM Medical Record  [id number] Patient Account Number: [id number] Date of Birth/Sex: Treating RN: 1927-08-10 (85 y.o. [name] Primary Care Lucy Boardman: [employer] Other Clinician: Referring Avanna Sowder: Treating Svetlana Bagby/Extender:Stone III, Elaina Pattee, Moses Manners in Treatment: 0 Active Inactive Abuse / Safety / Falls / Self Care Management Nursing Diagnoses: Potential for falls Potential for injury related to falls Goals: Patient will not experience any injury related to falls Date Initiated: 11/04/2018 Target Resolution Date: 12/04/2018 Goal Status: Active Patient/caregiver will verbalize/demonstrate measures taken to prevent injury and/or falls Date Initiated: 11/04/2018 Target Resolution  Date: 12/04/2018 Goal Status: Active Interventions: Assess Activities of Daily Living upon admission and as needed Assess fall risk on admission and as needed Assess: immobility, friction, shearing, incontinence upon admission and as needed Assess impairment of mobility on admission and as needed per policy Assess personal safety and home safety (as indicated) on admission and as needed Assess self care needs on admission and as needed Provide education on fall prevention Provide education on personal and home safety Notes: Nutrition Nursing Diagnoses: Impaired glucose control: actual or potential Potential for alteratiion in Nutrition/Potential for imbalanced nutrition Goals: Patient/caregiver agrees to and verbalizes understanding of need to use nutritional supplements and/or vitamins as prescribed Date Initiated: 11/04/2018 Target Resolution Date: 12/04/2018 Goal Status: Active Patient/caregiver will maintain therapeutic glucose control Date Initiated: 11/04/2018 Target Resolution Date: 12/04/2018 Goal Status: Active Interventions: Assess HgA1c results as ordered upon admission and as needed Assess patient nutrition upon admission and as needed per policy Provide education on  elevated blood sugars and impact on wound healing Provide education on nutrition Notes: Wound/Skin Impairment Nursing Diagnoses: Impaired tissue integrity Knowledge deficit related to ulceration/compromised skin integrity Goals: Patient/caregiver will verbalize understanding of skin care regimen Date Initiated: 11/04/2018 Target Resolution Date: 12/04/2018 Goal Status: Active Interventions: Assess patient/caregiver ability to obtain necessary supplies Assess patient/caregiver ability to perform ulcer/skin care regimen upon admission and as needed Assess ulceration(s) every visit Provide education on ulcer and skin care Notes: Electronic Signature(s) Signed: 11/04/2018 6:50:57 PM By: Levan Hurst RN, BSN Entered By: Levan Hurst on 11/04/2018 18:48:51 -------------------------------------------------------------------------------- Patient/Caregiver Education Details Patient Name: [name] 10/21/2020andnbsp3:00 Date of Service: PM Medical Record [id number] Number: Patient Account Number: [id number] Treating RN: Date of Birth/Gender: 24-Aug-1927 (85 y.o. [name]) Other Clinician: Primary Care Physician: [employer] Treating Worthy Keeler Referring Physician: Physician/Extender: Ilean China in Treatment: 0 Education Assessment Education Provided To: Patient Education Topics Provided Elevated Blood Sugar/ Impact on Healing: Methods: Explain/Verbal Responses: State content correctly Nutrition: Methods: Explain/Verbal Responses: State content correctly Safety: Methods: Explain/Verbal Responses: State content correctly Wound/Skin Impairment: Methods: Explain/Verbal Responses: State content correctly Electronic Signature(s) Signed: 11/04/2018 6:50:57 PM By: Levan Hurst RN, BSN Entered By: Levan Hurst on 11/04/2018 18:49:06 -------------------------------------------------------------------------------- Wound Assessment  Details Patient Name: Date of Service: [name] 11/04/2018 3:00 PM Medical Record [id number] Patient Account Number: [id number] Date of Birth/Sex: Treating RN: 06/04/1927 (85 y.o. [name]) Carlene Coria Primary Care Marsella Suman: [employer] Other Clinician: Referring Esaw Knippel: Treating Arif Amendola/Extender:Stone III, Elaina Pattee, JOHN Weeks in Treatment: 0 Wound Status Wound Number: 5 Primary Diabetic Wound/Ulcer of the Lower Extremity Etiology: Wound Location: Left Toe Great Wound Open Wounding Event: Blister Status: Date Acquired: 09/15/2018 Comorbid Cataracts, Sleep Apnea, Arrhythmia, Weeks Of Treatment: 0 History: Congestive Heart Failure, Hypertension, Clustered Wound: No Peripheral Arterial Disease, Type II Diabetes, Pending Amputation On Presentation Osteoarthritis, Neuropathy Photos Wound Measurements Length: (cm) 1 Width: (cm) 0.6 Depth: (cm) 0.1 Area: (cm) 0.471 Volume: (cm) 0.047 Wound Description Classification: Unable to visualize wound bed Exudate Amount: None Present Foul Odor After Cleansing: No Slough/Fibrino Ye % Reduction in Area: 0% % Reduction in Volume: 0% Epithelialization: None Tunneling: No Undermining: No s Wound Bed Granulation Amount: None Present (0%) Exposed Structure Necrotic Amount: Large (67-100%) Fascia Exposed: No Necrotic Quality: Eschar, Adherent Slough Fat Layer (Subcutaneous Tissue) Exposed: No Tendon Exposed: No Muscle Exposed: No Joint Exposed: No Bone Exposed: No Electronic Signature(s) Signed: 11/05/2018 4:25:07 PM By: Mikeal Hawthorne EMT/HBOT Signed: 12/22/2018 3:01:15 PM By: Carlene Coria RN Previous Signature: 11/04/2018 6:13:43  PM Version By: Worthy Keeler PA-C Entered By: Mikeal Hawthorne on 11/05/2018 11:29:46 -------------------------------------------------------------------------------- Wound Assessment Details Patient Name: Date of Service: [name], [name] 11/04/2018 3:00 PM Medical Record  [id number] Patient Account Number: [id number] Date of Birth/Sex: Treating RN: 12/18/1927 (85 y.o. [name]) Carlene Coria Primary Care Tauno Falotico: Other Clinician: [employer] Referring Rigby Swamy: Treating Ankita Newcomer/Extender:Stone III, Elaina Pattee, JOHN Weeks in Treatment: 0 Wound Status Wound Number: 6 Primary Diabetic Wound/Ulcer of the Lower Extremity Etiology: Wound Location: Left Ankle - Lateral Wound Open Wounding Event: Blister Status: Date Acquired: 09/15/2018 Comorbid Cataracts, Sleep Apnea, Arrhythmia, Weeks Of Treatment: 0 History: Congestive Heart Failure, Hypertension, Clustered Wound: No Peripheral Arterial Disease, Type II Diabetes, Pending Amputation On Presentation Osteoarthritis, Neuropathy Photos Wound Measurements Length: (cm) 0.5 Width: (cm) 1.5 Depth: (cm) 0.1 Area: (cm) 0.589 Volume: (cm) 0.059 Wound Description Classification: Unable to visualize wound bed Exudate Amount: None Present Foul Odor After Cleansing: No Slough/Fibrino Ye % Reduction in Area: 0% % Reduction in Volume: 0% Epithelialization: None s Wound Bed Granulation Amount: None Present (0%) Exposed Structure Necrotic Amount: Large (67-100%) Fascia Exposed: No Necrotic Quality: Eschar, Adherent Slough Fat Layer (Subcutaneous Tissue) Exposed: No Tendon Exposed: No Muscle Exposed: No Joint Exposed: No Bone Exposed: No Electronic Signature(s) Signed: 11/05/2018 4:25:07 PM By: Mikeal Hawthorne EMT/HBOT Signed: 12/22/2018 3:01:15 PM By: Carlene Coria RN Previous Signature: 11/04/2018 6:13:43 PM Version By: Worthy Keeler PA-C Entered By: Mikeal Hawthorne on 11/05/2018 11:31:45 -------------------------------------------------------------------------------- Vitals Details Patient Name: Date of Service: [name]. 11/04/2018 3:00 PM Medical Record [id number] Patient Account Number: [id number] Date of Birth/Sex: Treating RN: 07-22-1927 (85 y.o. [name] Primary  Care Lounell Schumacher: [employer] Other Clinician: Referring Dairon Procter: Treating Dajour Pierpoint/Extender:Stone III, Elaina Pattee, JOHN Weeks in Treatment: 0 Vital Signs Time Taken: 15:43 Temperature (F): 98.1 Height (in): 71 Pulse (bpm): 55 Source: Stated Respiratory Rate (breaths/min): 18 Weight (lbs): 220 Blood Pressure (mmHg): 154/63 Source: Stated Reference Range: 80 - 120 mg / dl Body Mass Index (BMI): 30.7 Electronic Signature(s) Signed: 12/22/2018 3:02:15 PM By: Sandre Kitty Entered By: Sandre Kitty on 11/04/2018 15:51:35

## 2018-12-22 NOTE — Progress Notes (Signed)
[name], [name] ([id number]) Visit Report for 11/04/2018 Abuse/Suicide Risk Screen Details Patient Name: Date of Service: DAXTIN, LEIKER 11/04/2018 3:00 PM Medical Record [id number] Patient Account Number: [id number] Date of Birth/Sex: Treating RN: 1927-10-13 (85 y.o. [name]) Carlene Coria Primary Care Tieisha Darden: [employer] Other Clinician: Referring Krystalyn Kubota: Treating Seniyah Esker/Extender:Stone III, Elaina Pattee, JOHN Weeks in Treatment: 0 Abuse/Suicide Risk Screen Items Answer ABUSE RISK SCREEN: Has anyone close to you tried to hurt or harm you recentlyo No Do you feel uncomfortable with anyone in your familyo No Has anyone forced you do things that you didnt want to doo No Electronic Signature(s) Signed: 12/22/2018 3:01:15 PM By: Carlene Coria RN Entered By: Carlene Coria on 11/04/2018 16:27:08 -------------------------------------------------------------------------------- Activities of Daily Living Details Patient Name: Date of Service: [name], [name] 11/04/2018 3:00 PM Medical Record [id number] Patient Account Number: [id number] Date of Birth/Sex: Treating RN: August 15, 1927 (85 y.o. [name]) Carlene Coria Primary Care Morine Kohlman: [employer] Other Clinician: Referring Deshonda Cryderman: Treating Melina Mosteller/Extender:Stone III, Elaina Pattee, JOHN Weeks in Treatment: 0 Activities of Daily Living Items Answer Activities of Daily Living (Please select one for each item) Drive Automobile Not Able Take Medications Need Assistance Use Telephone Completely [place] for Appearance Need Assistance Use Toilet Need Assistance Bath / Shower Need Assistance Dress Self Need Assistance Feed Self Completely Able Walk Not Able Get In / Out Bed Need Assistance Housework Not Able Prepare Meals Not Able Handle Money Need Assistance Shop for Self Need Assistance Electronic Signature(s) Signed: 12/22/2018 3:01:15 PM By: Carlene Coria RN Entered By: Carlene Coria on 11/04/2018  16:28:38 -------------------------------------------------------------------------------- Education Screening Details Patient Name: Date of Service: [name] 11/04/2018 3:00 PM Medical Record [id number] Patient Account Number: [id number] Date of Birth/Sex: Treating RN: [birth year] (85 y.o. [name]) Carlene Coria Primary Care Taelor Moncada: [employer] Other Clinician: Referring Hassel Uphoff: Treating [name] Danzer/Extender:Stone III, Elaina Pattee, Moses Manners in Treatment: 0 Primary Learner Assessed: Patient Learning Preferences/Education Level/Primary Language Learning Preference: Explanation Highest Education Level: College or Above Preferred Language: English Cognitive Barrier Language Barrier: No Translator Needed: No Memory Deficit: No Emotional Barrier: No Cultural/Religious Beliefs Affecting Medical Care: No Physical Barrier Impaired Vision: Yes Glasses Impaired Hearing: No Decreased Hand dexterity: No Knowledge/Comprehension Knowledge Level: High Comprehension Level: High Ability to understand written High instructions: Ability to understand verbal High instructions: Motivation Anxiety Level: Calm Cooperation: Cooperative Education Importance: Acknowledges Need Interest in Health Problems: Asks Questions Perception: Coherent Willingness to Engage in Self- High Management Activities: Readiness to Engage in Self- High Management Activities: Electronic Signature(s) Signed: 12/22/2018 3:01:15 PM By: Carlene Coria RN Entered By: Carlene Coria on 11/04/2018 16:29:08 -------------------------------------------------------------------------------- Fall Risk Assessment Details Patient Name: Date of Service: [name] 11/04/2018 3:00 PM Medical Record [id number] Patient Account Number: [id number] Date of Birth/Sex: Treating RN: [birth year] (85 y.o. [name]) Carlene Coria Primary Care Jaycie Kregel: [employer] Other Clinician: Referring Maura Braaten: Treating  Melaya Hoselton/Extender:Stone III, Elaina Pattee, JOHN Weeks in Treatment: 0 Fall Risk Assessment Items Have you had 2 or more falls in the last 12 monthso 0 No Have you had any fall that resulted in injury in the last 12 monthso 0 No FALLS RISK SCREEN History of falling - immediate or within 3 months 0 No Secondary diagnosis (Do you have 2 or more medical diagnoseso) 0 No Ambulatory aid None/bed rest/wheelchair/nurse 0 No Crutches/cane/walker 0 No Furniture 0 No Intravenous therapy Access/Saline/Heparin Lock 0 No Weak (short steps with or without shuffle, stooped but able to lift head 0 No while walking, may seek support from furniture) Impaired (short  steps with shuffle, may have difficulty arising from chair, 0 No head down, impaired balance) Mental Status Oriented to own ability 0 No Overestimates or forgets limitations 0 No Risk Level: Low Risk Score: 0 Electronic Signature(s) Signed: 12/22/2018 3:01:15 PM By: Carlene Coria RN Entered By: Carlene Coria on 11/04/2018 16:29:21 -------------------------------------------------------------------------------- Foot Assessment Details Patient Name: Date of Service: [name] 11/04/2018 3:00 PM Medical Record [id number] Patient Account Number: [id number] Date of Birth/Sex: Treating RN: 10-16-1927 (85 y.o. [name]) Carlene Coria Primary Care Yaneth Fairbairn: [employer] Other Clinician: Referring Harlan Ervine: Treating Valerya Maxton/Extender:Stone III, Elaina Pattee, JOHN Weeks in Treatment: 0 Foot Assessment Items Site Locations + = Sensation present, - = Sensation absent, C = Callus, U = Ulcer R = Redness, W = Warmth, M = Maceration, PU = Pre-ulcerative lesion F = Fissure, S = Swelling, D = Dryness Assessment Right: Left: Other Deformity: No No Prior Foot Ulcer: No No Prior Amputation: No No Charcot Joint: No No Ambulatory Status: Non-ambulatory Assistance Device: Wheelchair Gait: Administrator, arts) Signed: 12/22/2018  3:01:15 PM By: Carlene Coria RN Entered By: Carlene Coria on 11/04/2018 16:31:05 -------------------------------------------------------------------------------- Nutrition Risk Screening Details Patient Name: Date of Service: TREVONTAE, LINDAHL 11/04/2018 3:00 PM Medical Record [id number] Patient Account Number: [id number] Date of Birth/Sex: Treating RN: 25-Mar-1927 (85 y.o. [name]) Carlene Coria Primary Care Coby Antrobus: [employer] Other Clinician: Referring Theresea Trautmann: Treating Shawntee Mainwaring/Extender:Stone III, Elaina Pattee, JOHN Weeks in Treatment: 0 Height (in): 71 Weight (lbs): 220 Body Mass Index (BMI): 30.7 Nutrition Risk Screening Items Score Screening NUTRITION RISK SCREEN: I have an illness or condition that made me change the kind and/or 0 No amount of food I eat I eat fewer than two meals per day 0 No I eat few fruits and vegetables, or milk products 0 No I have three or more drinks of beer, liquor or wine almost every day 0 No I have tooth or mouth problems that make it hard for me to eat 0 No I don't always have enough money to buy the food I need 0 No I eat alone most of the time 0 No I take three or more different prescribed or over-the-counter drugs a day 1 Yes 0 No Without wanting to, I have lost or gained 10 pounds in the last six months I am not always physically able to shop, cook and/or feed myself 2 Yes Nutrition Protocols Good Risk Protocol Provide education on Moderate Risk Protocol 0 nutrition High Risk Proctocol Risk Level: Moderate Risk Score: 3 Electronic Signature(s) Signed: 12/22/2018 3:01:15 PM By: Carlene Coria RN Entered By: Carlene Coria on 11/04/2018 16:29:34

## 2018-12-22 NOTE — Progress Notes (Signed)
Inpatient Rehabilitation Admissions Coordinator  I have received an initial denial from [facility] Medicare for [place]. I spoke with son, [name], and he is aware and requesting expedited appeal which I will initiate now. It typically take 48 hrs for that appeal.   Danne Baxter, RN, MSN Rehab Admissions Coordinator [phone] 12/22/2018 3:28 PM

## 2018-12-23 LAB — GLUCOSE, CAPILLARY
Glucose-Capillary: 114 mg/dL — ABNORMAL HIGH (ref 70–99)
Glucose-Capillary: 105 mg/dL — ABNORMAL HIGH (ref 70–99)
Glucose-Capillary: 123 mg/dL — ABNORMAL HIGH (ref 70–99)
Glucose-Capillary: 148 mg/dL — ABNORMAL HIGH (ref 70–99)

## 2018-12-23 MED ORDER — TRAMADOL HCL 50 MG PO TABS: 50.0000 mg | ORAL_TABLET | Freq: Four times a day (QID) | ORAL | 0 refills | Status: DC | PRN

## 2018-12-23 NOTE — Progress Notes (Signed)
Sleepy but easily arousable 0 cc in VAC. VSS afebrile.  Reviewed notes from social work OT/PT and spoke with Dr. Sharol Given. Patient will be discharged to SNF . Dr. Sharol Given  Does not want to delay discharge for insurance appeals and also feels patient is a better SNF candidate.

## 2018-12-23 NOTE — Progress Notes (Signed)
Physical Therapy Treatment Patient Details Name: JODECI ROARTY MRN: [id number] DOB: [birth year] Today's Date: 12/23/2018    History of Present Illness Pt is an 85 y/o male admitted from CIR for planned L transtibial amputation. PMH including but not limited to R transtibial amputation in August of 2020, L transmetatarsal amputation on 12/02/18, DM, HTN, PVD.    PT Comments    Continuing work on functional mobility and activity tolerance;  Building off of yesterday's transfer training, session focused on initial sit to stand training to R prosthesis; Stood from elevated bed surface to [place]; Noting improving initial liftoff from the bed; Still with difficulty getting hips fully extended due to extensor and abductor weakness, and required lots of support on L side as well;   I anticipate Mr. Bordelon more getting to independence at a wheelchair transfer level; Highly recommend post-acute rehab for more intensive therapies to reach that level.  Follow Up Recommendations  CIR;Supervision/Assistance - 24 hour     Equipment Recommendations  None recommended by PT    Recommendations for Other Services       Precautions / Restrictions Precautions Precautions: Fall Precaution Comments: prior R transtibial amputation; wound VAC to L residual limb Required Braces or Orthoses: Other Brace Other Brace: Limbguard LLE Restrictions LLE Weight Bearing: Non weight bearing Other Position/Activity Restrictions: R BK amp is healed; Prosthesis is new; he is on a wearing/breaking in schedule now: 2.5 hours in the am, and 2.5 hours in the pm today, and adding 30 minutes to wearing sessions each day    Mobility  Bed Mobility Overal bed mobility: Needs Assistance Bed Mobility: Supine to Sit     Supine to sit: Mod assist;+2 for physical assistance     General bed mobility comments: Heavy mod assist to pull to sit; Bed slightly higher than last sessions and noting better sitting balance, with better  anterior weight shift in sitting and less posterior lean  Transfers Overall transfer level: Needs assistance   Transfers: Sit to/from Stand Sit to Stand: +2 physical assistance;Max assist         General transfer comment: Building off of last session, opted to try sit to stand transfer to stedy from elevated bed; Still requiring max assist, but able to briefly get to fully upright standing on R prosthesis to be able to use Stedy to get to chair  Ambulation/Gait                 Stairs             Wheelchair Mobility    Modified Rankin (Stroke Patients Only)       Balance     Sitting balance-Leahy Scale: Poor       Standing balance-Leahy Scale: Zero                              Cognition Arousal/Alertness: Awake/alert Behavior During Therapy: WFL for tasks assessed/performed Overall Cognitive Status: Within Functional Limits for tasks assessed                               Problem Solving: Slow processing;Requires verbal cues;Requires tactile cues        Exercises      General Comments        Pertinent Vitals/Pain Pain Assessment: Faces Faces Pain Scale: Hurts even more Pain Location: L residual limb; When asked about pain, he  says no -- but notable grimace with activity Pain Descriptors / Indicators: Sore;Grimacing Pain Intervention(s): Monitored during session    Home Living                      Prior Function            PT Goals (current goals can now be found in the care plan section) Acute Rehab PT Goals Patient Stated Goal: to get back to rehab and then eventually home PT Goal Formulation: With patient/family Time For Goal Achievement: 01/02/19 Potential to Achieve Goals: Fair Progress towards PT goals: Progressing toward goals    Frequency    Min 4X/week      PT Plan Current plan remains appropriate;Frequency needs to be updated    Co-evaluation              AM-PAC PT "6  Clicks" Mobility   Outcome Measure  Help needed turning from your back to your side while in a flat bed without using bedrails?: A Lot Help needed moving from lying on your back to sitting on the side of a flat bed without using bedrails?: A Lot Help needed moving to and from a bed to a chair (including a wheelchair)?: A Lot Help needed standing up from a chair using your arms (e.g., wheelchair or bedside chair)?: Total Help needed to walk in hospital room?: Total Help needed climbing 3-5 steps with a railing? : Total 6 Click Score: 9    End of Session Equipment Utilized During Treatment: Gait belt Activity Tolerance: Patient tolerated treatment well Patient left: in chair;with call bell/phone within reach;with chair alarm set;Other (comment)(seat height increased by bolstering with blankets) Nurse Communication: Mobility status;Need for lift equipment(Maximove for back to bed) PT Visit Diagnosis: Other abnormalities of gait and mobility (R26.89) Pain - Right/Left: Left Pain - part of body: Leg     Time: 8250-5397 PT Time Calculation (min) (ACUTE ONLY): 32 min  Charges:  $Therapeutic Activity: 23-37 mins                     Roney Marion, PT  Acute Rehabilitation Services Pager [phone] Office B and E 12/23/2018, 2:01 PM

## 2018-12-23 NOTE — NC FL2 (Signed)
[place] LEVEL OF CARE SCREENING TOOL     IDENTIFICATION  Patient Name: [name] Birthdate: November 17, 1927 Sex: male Admission Date (Current Location): 12/18/2018  [facility] and [place] Number:  [employer] and Address:  [facility] [place] [place] [address] [place], [place] [address]      Provider Number: [id number]  Attending Physician Name and Address:  [name], MD  Relative Name and Phone Number:  Janari Yamada [phone]    Current Level of Care: Hospital Recommended Level of Care: [place] Prior Approval Number:    Date Approved/Denied:   PASRR Number: [id number]  Discharge Plan: SNF    Current Diagnoses: Patient Active Problem List   Diagnosis Date Noted  . Dehiscence of amputation stump ([place])   . History of transmetatarsal amputation of left foot ([place]) 12/09/2018  . Critical limb ischemia with history of revascularization of same extremity   . Gangrene of left foot ([place])   . Diabetic ulcer of toe of left foot associated with type 2 diabetes mellitus ([place])   . Cellulitis of left lower extremity   . Toe infection 11/18/2018  . Peripheral edema   . Acute on chronic diastolic CHF (congestive heart failure) ([place]) 11/09/2018  . S/P BKA (below knee amputation) unilateral, right ([place]) 10/05/2018  . Hyponatremia   . Essential hypertension   . Postoperative pain   . Pain of left heel   . Unable to maintain weight-bearing   . Type 2 diabetes mellitus with diabetic peripheral angiopathy with gangrene ([place])   . Anemia due to acute blood loss   . Stage 3 chronic kidney disease   . Acquired absence of right leg below knee ([place]) 09/07/2018  . Gangrene of right foot ([place])   . DNR (do not resuscitate) discussion   . Goals of care, counseling/discussion   . Palliative care by specialist   . Type 2 diabetes mellitus with foot ulcer and gangrene ([place])   . Severe protein-calorie malnutrition ([place])   . Ischemic  foot 08/20/2018  . Critical lower limb ischemia 08/03/2018  . Type 2 diabetes mellitus with foot ulcer, without long-term current use of insulin ([place])   . Gastroesophageal reflux disease   . Cellulitis of great toe of right foot 05/29/2018  . Atrial fibrillation, chronic   . Chest pain 07/25/2017  . PAF (paroxysmal atrial fibrillation) ([place]) 07/25/2017  . BPH (benign prostatic hyperplasia) 07/25/2017  . Shortness of breath 05/11/2017  . Hypothyroidism 05/11/2017  . Chronic diastolic CHF (congestive heart failure) ([place]) 05/11/2017  . Moderate aortic stenosis 06/12/2016  . RBBB 06/12/2016  . Peripheral vascular disease ([place]) 08/04/2012  . Essential hypertension, benign 08/04/2012  . Hyperlipidemia 08/04/2012  . Dizziness 08/04/2012    Orientation RESPIRATION BLADDER Height & Weight     Self, Time, Situation, Place  Normal Incontinent Weight: 92.5 kg Height:  5\' 11"  (180.3 cm)  BEHAVIORAL SYMPTOMS/MOOD NEUROLOGICAL BOWEL NUTRITION STATUS  Other (Comment)(N/A) (N/A) Continent Diet(see discharge summary)  AMBULATORY STATUS COMMUNICATION OF NEEDS Skin   Extensive Assist Verbally Surgical wounds, Other (Comment)(Left leg with prevena pump)                       Personal Care Assistance Level of Assistance  Bathing, Feeding, Dressing Bathing Assistance: Maximum assistance Feeding assistance: Independent Dressing Assistance: Maximum assistance     Functional Limitations Info  Sight, Hearing, Speech Sight Info: Impaired Hearing Info: Adequate Speech Info: Adequate  SPECIAL CARE FACTORS FREQUENCY  PT (By licensed PT), OT (By licensed OT)     PT Frequency: Min 4X/week OT Frequency: Min 2X/week            Contractures Contractures Info: Not present    Additional Factors Info  Code Status, Allergies Code Status Info: DNR Allergies Info: Iodinated Diagnostic Agents; red dye; iodine; Iohexol           Current Medications (12/23/2018):  This is the current  hospital active medication list Current Facility-Administered Medications  Medication Dose Route Frequency Provider Last Rate Last Dose  . acetaminophen (TYLENOL) tablet 325-650 mg  325-650 mg Oral Q6H PRN Persons, Bevely Palmer, PA   325 mg at 12/23/18 1024  . apixaban (ELIQUIS) tablet 5 mg  5 mg Oral BID Persons, Bevely Palmer, [place]   5 mg at 12/23/18 5697  . aspirin EC tablet 81 mg  81 mg Oral Daily Persons, Bevely Palmer, [place]   81 mg at 12/23/18 0951  . atorvastatin (LIPITOR) tablet 40 mg  40 mg Oral Daily Persons, Bevely Palmer, PA   40 mg at 12/23/18 0951  . clopidogrel (PLAVIX) tablet 75 mg  75 mg Oral Q breakfast Persons, Bevely Palmer, PA   75 mg at 12/23/18 9480  . docusate sodium (COLACE) capsule 100 mg  100 mg Oral BID Persons, Bevely Palmer, PA   100 mg at 12/23/18 0951  . dorzolamide-timolol (COSOPT) 22.3-6.8 MG/ML ophthalmic solution 1 drop  1 drop Both Eyes BID Persons, Bevely Palmer, [place]   1 drop at 12/23/18 0951  . famotidine (PEPCID) tablet 20 mg  20 mg Oral Daily Persons, Bevely Palmer, [place]   20 mg at 12/23/18 0951  . finasteride (PROSCAR) tablet 5 mg  5 mg Oral QPM Persons, Bevely Palmer, PA   5 mg at 12/22/18 1651  . furosemide (LASIX) tablet 40 mg  40 mg Oral Daily Persons, Bevely Palmer, PA   40 mg at 12/23/18 0951  . glipiZIDE (GLUCOTROL) tablet 5 mg  5 mg Oral BID Persons, Bevely Palmer, PA   5 mg at 12/23/18 0951  . insulin aspart (novoLOG) injection 0-15 Units  0-15 Units Subcutaneous TID WC Persons, Bevely Palmer, [place]   2 Units at 12/22/18 1215  . iron polysaccharides (NIFEREX) capsule 150 mg  150 mg Oral Daily Persons, Bevely Palmer, PA   150 mg at 12/23/18 1655  . lactated ringers infusion   Intravenous Continuous Persons, Bevely Palmer, [place] 10 mL/hr at 12/18/18 2212    . levothyroxine (SYNTHROID) tablet 50 mcg  50 mcg Oral QAC breakfast Persons, Bevely Palmer, [place]   50 mcg at 12/23/18 [phone]  . metoCLOPramide (REGLAN) tablet 5-10 mg  5-10 mg Oral Q8H PRN Persons, Bevely Palmer, PA       Or  . metoCLOPramide (REGLAN) injection 5-10 mg   5-10 mg Intravenous Q8H PRN Persons, Bevely Palmer, PA      . ondansetron [facility]) tablet 4 mg  4 mg Oral Q6H PRN Persons, Bevely Palmer, PA       Or  . ondansetron (ZOFRAN) injection 4 mg  4 mg Intravenous Q6H PRN Persons, Bevely Palmer, PA      . polyethylene glycol (MIRALAX / GLYCOLAX) packet 17 g  17 g Oral Daily PRN Persons, Bevely Palmer, PA      . potassium chloride (KLOR-CON) CR tablet 10 mEq  10 mEq Oral Daily Persons, Bevely Palmer, [place]   10 mEq at 12/23/18 2707  . senna-docusate (Senokot-S) tablet 1 tablet  1 tablet Oral BID PRN Persons, Bevely Palmer, [place]   1 tablet at 12/20/18 1101  . tamsulosin (FLOMAX) capsule 0.4 mg  0.4 mg Oral BID Persons, Bevely Palmer, PA   0.4 mg at 12/23/18 2633  . traMADol (ULTRAM) tablet 50 mg  50 mg Oral Q6H PRN Marybelle Killings, MD   50 mg at 12/23/18 0657  . vitamin B-12 (CYANOCOBALAMIN) tablet 1,000 mcg  1,000 mcg Oral Daily Persons, Bevely Palmer, PA   1,000 mcg at 12/23/18 0951  . vitamin C (ASCORBIC ACID) tablet 500 mg  500 mg Oral Daily Persons, Bevely Palmer, PA   500 mg at 12/23/18 3545     Discharge Medications: Please see discharge summary for a list of discharge medications.  Relevant Imaging Results:  Relevant Lab Results:   Additional Information SSN:[id number]; prevena pump. Will be removed in office at 1st post op  Midge Minium MSN, [place], NCM-BC, ACM-RN [phone]

## 2018-12-23 NOTE — Plan of Care (Signed)
  Problem: Education: Goal: Knowledge of General Education information will improve Description: Including pain rating scale, medication(s)/side effects and non-pharmacologic comfort measures Outcome: Progressing   Problem: Clinical Measurements: Goal: Will remain free from infection Outcome: Progressing   Problem: Safety: Goal: Ability to remain free from injury will improve Outcome: Progressing   Problem: Skin Integrity: Goal: Risk for impaired skin integrity will decrease Outcome: Progressing   

## 2018-12-23 NOTE — Discharge Summary (Signed)
Discharge Diagnoses:  Active Problems:   Gangrene of left foot ([place])   Dehiscence of amputation stump ([facility])   Surgeries: Procedure(s): LEFT BELOW KNEE AMPUTATION on 12/18/2018    Consultants:   Discharged Condition: Improved  Hospital Course: [name] is an 85 y.o. male who was admitted 12/18/2018 with a chief complaint of gangrene left foot, with a final diagnosis of Gangrene Left Foot.  Patient was brought to the operating room on 12/18/2018 and underwent Procedure(s): LEFT BELOW KNEE AMPUTATION.    Patient was given perioperative antibiotics:  Anti-infectives (From admission, onward)   Start     Dose/Rate Route Frequency Ordered Stop   12/18/18 2000  ceFAZolin (ANCEF) IVPB 2g/100 mL premix     2 g 200 mL/hr over 30 Minutes Intravenous Every 6 hours 12/18/18 1606 12/20/18 0700   12/18/18 1200  ceFAZolin (ANCEF) IVPB 2g/100 mL premix     2 g 200 mL/hr over 30 Minutes Intravenous On call to O.R. 12/18/18 1152 12/18/18 1432   12/18/18 1155  ceFAZolin (ANCEF) 2-4 GM/100ML-% IVPB    Note to Pharmacy: [facility]   : cabinet override      12/18/18 1155 12/18/18 1402    .  Patient was given sequential compression devices, early ambulation, and aspirin for DVT prophylaxis.  Recent vital signs:  Patient Vitals for the past 24 hrs:  BP Temp Temp src Pulse Resp SpO2  12/23/18 0738 (!) 147/62 97.6 F (36.4 C) Oral (!) 57 15 98 %  12/23/18 0333 132/66 97.9 F (36.6 C) Oral (!) 57 14 97 %  12/22/18 2116 (!) 131/58 98.1 F (36.7 C) Oral (!) 57 15 95 %  12/22/18 1514 (!) 140/59 97.8 F (36.6 C) Oral (!) 59 15 100 %  .  Recent laboratory studies: No results found.  Discharge Medications:   Allergies as of 12/23/2018      Reactions   Iodinated Diagnostic Agents    Other reaction(s): Fever   Dye Fdc Red [red Dye] Hives   Iodine-131 Other (See Comments)   Breakout, fever   Iohexol     Code: RASH, Desc: PT STATES HOT ALL OVER HAD TO HAVE ICE PACKS ALL OVER HIM AND DIFF  BREATHING, PT REFUSED IV DYE      Medication List    STOP taking these medications   azithromycin 250 MG tablet Commonly known as: Zithromax   povidone-Iodine 5 % Soln topical solution Commonly known as: BETADINE     TAKE these medications   acetaminophen 325 MG tablet Commonly known as: TYLENOL Take 1-2 tablets (325-650 mg total) by mouth every 6 (six) hours as needed for mild pain (pain score 1-3 or temp > 100.5). What changed: Another medication with the same name was removed. Continue taking this medication, and follow the directions you see here.   apixaban 5 MG Tabs tablet Commonly known as: ELIQUIS Take 1 tablet (5 mg total) by mouth 2 (two) times daily.   aspirin EC 81 MG tablet Take 81 mg by mouth daily.   atorvastatin 40 MG tablet Commonly known as: LIPITOR Take 1 tablet (40 mg total) by mouth daily.   clopidogrel 75 MG tablet Commonly known as: PLAVIX Take 1 tablet (75 mg total) by mouth daily with breakfast.   dorzolamide-timolol 22.3-6.8 MG/ML ophthalmic solution Commonly known as: COSOPT Place 1 drop into both eyes 2 (two) times daily.   famotidine 20 MG tablet Commonly known as: PEPCID Take 1 tablet (20 mg total) by mouth daily.  finasteride 5 MG tablet Commonly known as: PROSCAR Take 1 tablet (5 mg total) by mouth every evening.   FISH OIL PO Take 1 capsule by mouth daily.   furosemide 40 MG tablet Commonly known as: LASIX Take 1 tablet (40 mg total) by mouth daily.   glipiZIDE 5 MG tablet Commonly known as: GLUCOTROL Take 5 mg by mouth 2 (two) times daily.   iron polysaccharides 150 MG capsule Commonly known as: NIFEREX Take 1 capsule (150 mg total) by mouth daily.   levothyroxine 50 MCG tablet Commonly known as: SYNTHROID Take 1 tablet (50 mcg total) by mouth daily before breakfast.   polyethylene glycol 17 g packet Commonly known as: MiraLax Take 17 g by mouth daily as needed for moderate constipation.   potassium chloride 10  MEQ tablet Commonly known as: KLOR-CON Take 1 tablet (10 mEq total) by mouth daily.   Santyl ointment Generic drug: collagenase Apply 1 application topically daily.   senna-docusate 8.6-50 MG tablet Commonly known as: Senokot-S Take 1 tablet by mouth 2 (two) times daily as needed for mild constipation.   tamsulosin 0.4 MG Caps capsule Commonly known as: FLOMAX Take 1 capsule (0.4 mg total) by mouth 2 (two) times daily.   traMADol 50 MG tablet Commonly known as: ULTRAM Take 1 tablet (50 mg total) by mouth every 6 (six) hours as needed for moderate pain.   vitamin B-12 1000 MCG tablet Commonly known as: CYANOCOBALAMIN Take 1 tablet (1,000 mcg total) by mouth daily.   vitamin C 500 MG tablet Commonly known as: ASCORBIC ACID Take 500 mg by mouth daily.       Diagnostic Studies: Dg Chest 2 View  Result Date: 12/09/2018 CLINICAL DATA:  Follow-up from previous exam EXAM: CHEST - 2 VIEW COMPARISON:  12/08/2018 FINDINGS: Cardiac shadow is enlarged but stable. Aortic calcifications are again seen. Bibasilar effusions and atelectasis/infiltrate are again noted left slightly greater than right stable from recent exam. No acute bony abnormality is noted. IMPRESSION: Stable bibasilar effusions and atelectasis/infiltrate. Electronically Signed   By: Inez Catalina M.D.   On: 12/09/2018 19:25   Dg Chest Port 1 View  Result Date: 12/08/2018 CLINICAL DATA:  Leukocytosis. EXAM: PORTABLE CHEST 1 VIEW COMPARISON:  11/18/2018. FINDINGS: Stable cardiomegaly. Bibasilar pulmonary infiltrates/edema, progressed from prior exam. Bilateral moderate pleural effusions again noted. No pneumothorax. IMPRESSION: Stable cardiomegaly. Bibasilar pulmonary infiltrates/edema, progressed from prior exam. Progressive CHF and/or pneumonia could present this fashion. Bilateral moderate pleural effusions again noted without interim change. Electronically Signed   By: Marcello Moores  Register   On: 12/08/2018 09:46    Patient  benefited maximally from their hospital stay and there were no complications.     Disposition: Discharge disposition: 03-Skilled Nursing Facility      Discharge Instructions    Call MD / Call 911   Complete by: As directed    If you experience chest pain or shortness of breath, CALL 911 and be transported to the hospital emergency room.  If you develope a fever above 101 F, pus (white drainage) or increased drainage or redness at the wound, or calf pain, call your surgeon's office.   Call MD / Call 911   Complete by: As directed    If you experience chest pain or shortness of breath, CALL 911 and be transported to the hospital emergency room.  If you develope a fever above 101 F, pus (white drainage) or increased drainage or redness at the wound, or calf pain, call your surgeon's office.  Constipation Prevention   Complete by: As directed    Drink plenty of fluids.  Prune juice may be helpful.  You may use a stool softener, such as Colace (over the counter) 100 mg twice a day.  Use MiraLax (over the counter) for constipation as needed.   Constipation Prevention   Complete by: As directed    Drink plenty of fluids.  Prune juice may be helpful.  You may use a stool softener, such as Colace (over the counter) 100 mg twice a day.  Use MiraLax (over the counter) for constipation as needed.   Diet - low sodium heart healthy   Complete by: As directed    Diet - low sodium heart healthy   Complete by: As directed    Increase activity slowly as tolerated   Complete by: As directed    Increase activity slowly as tolerated   Complete by: As directed    Neg Press Wound Therapy / Incisional   Complete by: As directed    Show patient how to attach prevena pump. Will be removed in office at 1st post op   Neg Press Wound Therapy / Incisional   Complete by: As directed    Do not remove until post op at office     Follow-up Information    [name], MD In 1 week.   Specialty: Orthopedic  Surgery Contact information: [address] [place] [address] [phone]            Signed: Bevely Palmer Aarya Robinson 12/23/2018, 8:19 AM

## 2018-12-23 NOTE — Plan of Care (Signed)
  Problem: Education: Goal: Knowledge of General Education information will improve Description: Including pain rating scale, medication(s)/side effects and non-pharmacologic comfort measures Outcome: Progressing   Problem: Clinical Measurements: Goal: Will remain free from infection Outcome: Progressing   Problem: Coping: Goal: Level of anxiety will decrease Outcome: Progressing   Problem: Elimination: Goal: Will not experience complications related to bowel motility Outcome: Progressing Goal: Will not experience complications related to urinary retention Outcome: Progressing   Problem: Pain Managment: Goal: General experience of comfort will improve Outcome: Progressing   Problem: Safety: Goal: Ability to remain free from injury will improve Outcome: Progressing   Problem: Skin Integrity: Goal: Risk for impaired skin integrity will decrease Outcome: Progressing   

## 2018-12-23 NOTE — TOC Progression Note (Signed)
Transition of Care [facility]) - Progression Note    Patient Details  Name: [name] MRN: [id number] Date of Birth: [birth year]  Transition of Care [facility]) CM/SW [place] MSN, RN, NCM-BC, [place] [phone] Phone Number: 12/23/2018, 4:17 PM  Clinical Narrative:    CM following for dispositional needs.CM spoke to the patients son to discuss the POC and to initiate a dual plan in the event the patient is denied CIR. CM explained the ST SNF referral  Process and COVID precautions at the facilities, with the son verbalizing understanding. The son is very hopeful for CIR, but understands the need for ST SNF for continued rehab. [place] and [place] MUST screening completed with the referral sent to [facility] and [facility] as requested. Patient will require insurance auth once a facility has been determined. CM team will continue to follow.     Expected Discharge Plan: IP Rehab Facility Barriers to Discharge: Insurance Authorization  Expected Discharge Plan and Services Expected Discharge Plan: [place] In-house Referral: Clinical Social Work Discharge Planning Services: CM Consult Post Acute Care Choice: [place] arrangements for the past 2 months: Single Family Home Expected Discharge Date: 12/23/18               DME Arranged: N/A DME Agency: NA       HH Arranged: NA HH Agency: NA         Social Determinants of Health (SDOH) Interventions    Readmission Risk Interventions Readmission Risk Prevention Plan 12/22/2018 11/26/2018 11/21/2018  Transportation Screening Complete Complete Complete  PCP or Specialist Appt within 3-5 Days - - -  Not Complete comments - - -  [place] or [place] or Home Care Consult comments - - -  Social Work Scientific laboratory technician for [place] Planning/Counseling - - -  Central - - -  Medication Review (RN Care Manager) Complete Complete Referral to Pharmacy  PCP or Specialist appointment within 3-5  days of discharge Complete Complete Complete  [place] or [place] Complete Complete Complete  SW Recovery Care/Counseling Consult Complete Complete Complete  Palliative Care Screening Not Applicable Not Applicable Not [place] Patient Refused Not Applicable Not Complete  Some recent data might be hidden

## 2018-12-23 NOTE — Progress Notes (Signed)
Inpatient Rehabilitation Admissions Coordinator  I await final determination of approval for a possible inpt rehab admit pending expedited appeal process. Patient and his son, [name], are aware as well as Persons PA, and RN CM.  Danne Baxter, RN, MSN Rehab Admissions Coordinator [phone] 12/23/2018 11:25 AM

## 2018-12-24 ENCOUNTER — Telehealth: Payer: Self-pay

## 2018-12-24 LAB — GLUCOSE, CAPILLARY
Glucose-Capillary: 71 mg/dL (ref 70–99)
Glucose-Capillary: 95 mg/dL (ref 70–99)
Glucose-Capillary: 95 mg/dL (ref 70–99)

## 2018-12-24 NOTE — Progress Notes (Addendum)
Occupational Therapy Treatment Patient Details Name: [name] MRN: [id number] DOB: [birth year] Today's Date: 12/24/2018    History of present illness Pt is an 85 y/o male admitted from [place] for planned L transtibial amputation. PMH including but not limited to R transtibial amputation in August of 2020, L transmetatarsal amputation on 12/02/18, DM, HTN, PVD.   OT comments  Pt making progress with functional goals. Used Stedy for sit - stand from EOB mod A +2 .Pt required mod A to don R prosthesis sitting EOB and participated in bathing and dressing seated in recliner. OT will continue to follow acutely  Follow Up Recommendations  CIR;Supervision/Assistance - 24 hour;SNF    Equipment Recommendations  Other (comment)(TBD at next venue of care)    Recommendations for Other Services      Precautions / Restrictions Precautions Precautions: Fall Precaution Comments: prior R transtibial amputation; wound VAC to L residual limb Required Braces or Orthoses: Other Brace Other Brace: Limbguard LLE Restrictions Weight Bearing Restrictions: Yes LLE Weight Bearing: Non weight bearing Other Position/Activity Restrictions: R BK amp is healed; Prosthesis is new; he is on a wearing/breaking in schedule now: trial for 3 hours today 9:30am-12:30pm       Mobility Bed Mobility Overal bed mobility: Needs Assistance Bed Mobility: Supine to Sit Rolling: Mod assist;+2 for physical assistance         General bed mobility comments: use of rails to elevate trunk and pads to move hips/pelvis to proper position  Transfers Overall transfer level: Needs assistance Equipment used: Rolling walker (2 wheeled) Transfers: Sit to/from Stand Sit to Stand: Mod assist;+2 physical assistance;+2 safety/equipment         General transfer comment: pt able to use Stedy after 2 trials sit - stand.    Balance Overall balance assessment: Needs assistance Sitting-balance support: Bilateral upper extremity  supported Sitting balance-Leahy Scale: Poor Sitting balance - Comments: Lots of work on upright sitting with ant post transfer and work in [employer]; Able to pull self from supported sitting  to unsupported sitting in recliner using armrests   Standing balance support: Bilateral upper extremity supported;During functional activity Standing balance-Leahy Scale: Zero                             ADL either performed or assessed with clinical judgement   ADL Overall ADL's : Needs assistance/impaired Eating/Feeding: Sitting;Set up   Grooming: Wash/dry hands;Wash/dry face;Oral care;Sitting;Min guard Grooming Details (indicate cue type and reason): unsupported sitting in recliner Upper Body Bathing: Minimal assistance;Sitting Upper Body Bathing Details (indicate cue type and reason): simulated, unsupported in recliner. pt with Poor sitting balance unnsupported Lower Body Bathing: Moderate assistance;Sitting/lateral leans   Upper Body Dressing : Min guard;Sitting       Toilet Transfer: Moderate assistance;+2 for physical assistance Toilet Transfer Details (indicate cue type and reason): simulated to recliner with use of Stedy, mod A +2 sit - stnad Toileting- Clothing Manipulation and Hygiene: Total assistance;Sit to/from stand       Functional mobility during ADLs: Moderate assistance;+2 for physical assistance;Cueing for safety General ADL Comments: pt participated in bathing and dressing seated in recliner     Vision Baseline Vision/History: Wears glasses Wears Glasses: At all times Patient Visual Report: No change from baseline     Perception     Praxis      Cognition Arousal/Alertness: Awake/alert Behavior During Therapy: WFL for tasks assessed/performed Overall Cognitive Status: Within Functional Limits for tasks  assessed Area of Impairment: Problem solving                       Following Commands: Follows one step commands with increased  time Safety/Judgement: Decreased awareness of deficits   Problem Solving: Slow processing;Requires verbal cues;Requires tactile cues          Exercises     Shoulder Instructions       General Comments      Pertinent Vitals/ Pain       Pain Assessment: Faces Faces Pain Scale: Hurts even more Pain Location: L residual limb; When asked about pain, he says "hurts" -- but notable grimace with activity Pain Descriptors / Indicators: Sore;Grimacing Pain Intervention(s): Limited activity within patient's tolerance;Monitored during session;Repositioned;RN gave pain meds during session  Home Living                                          Prior Functioning/Environment              Frequency  Min 2X/week        Progress Toward Goals  OT Goals(current goals can now be found in the care plan section)  Progress towards OT goals: Progressing toward goals  Acute Rehab OT Goals Patient Stated Goal: to get back to rehab and then eventually home OT Goal Formulation: With patient  Plan Discharge plan remains appropriate    Co-evaluation      Reason for Co-Treatment: For patient/therapist safety;To address functional/ADL transfers   OT goals addressed during session: ADL's and self-care;Proper use of Adaptive equipment and DME      AM-PAC OT "6 Clicks" Daily Activity     Outcome Measure   Help from another person eating meals?: None Help from another person taking care of personal grooming?: A Little Help from another person toileting, which includes using toliet, bedpan, or urinal?: A Lot Help from another person bathing (including washing, rinsing, drying)?: A Lot Help from another person to put on and taking off regular upper body clothing?: A Little Help from another person to put on and taking off regular lower body clothing?: Total 6 Click Score: 15    End of Session Equipment Utilized During Treatment: Gait belt;Other (comment)(Stedy)  OT  Visit Diagnosis: Other abnormalities of gait and mobility (R26.89);Muscle weakness (generalized) (M62.81);Pain Pain - Right/Left: Left Pain - part of body: Leg   Activity Tolerance Patient tolerated treatment well   Patient Left with call bell/phone within reach;in chair;with chair alarm set;with nursing/sitter in room   Nurse Communication          Time: 1245-8099 OT Time Calculation (min): 35 min  Charges: OT General Charges $OT Visit: 1 Visit OT Treatments $Self Care/Home Management : 8-22 mins  {   Britt Bottom 12/24/2018, 11:28 AM

## 2018-12-24 NOTE — Telephone Encounter (Signed)
They are trying desperately to get [name] into Rehab - wants to know if there is anything you can do to assist - Dr Jess Barters PA told them the hospital is trying to get him out.   his number [phone]

## 2018-12-24 NOTE — Telephone Encounter (Signed)
I called and spoke to Son, [name]- he's been denied x2- however he's in expedited appeal- it can take until tomorrow afternoon- I explained that- I also explained that sometimes it helps if family calls and gives as much info as they can to insurance as well.  He voiced understanding- I will also document in his chart that pt was requiring in/out caths for incomplete emptying while in rehab, the whole time. [name]

## 2018-12-24 NOTE — Plan of Care (Signed)
  Problem: Education: Goal: Knowledge of General Education information will improve Description: Including pain rating scale, medication(s)/side effects and non-pharmacologic comfort measures Outcome: Progressing   Problem: Safety: Goal: Ability to remain free from injury will improve Outcome: Progressing   

## 2018-12-24 NOTE — Progress Notes (Signed)
Physical Therapy Treatment Patient Details Name: [name] MRN: [id number] DOB: 04-10-1927 Today's Date: 12/24/2018    History of Present Illness Pt is an 85 y/o male admitted from [facility] for planned L transtibial amputation. PMH including but not limited to R transtibial amputation in August of 2020, L transmetatarsal amputation on 12/02/18, DM, HTN, PVD.    PT Comments    Patient very motivated. He continues to require +2 assist for coming to EOB and transfers with stedy. He is limited by decr balance (due to change in his center of mass), generalized weakness, and pain/lack of wt-bearing from lt BKA. Educated patient and RN that he should wear prosthesis for 3 hours and then remove.     Follow Up Recommendations  CIR;Supervision/Assistance - 24 hour     Equipment Recommendations  None recommended by PT    Recommendations for Other Services       Precautions / Restrictions Precautions Precautions: Fall Precaution Comments: prior R transtibial amputation; wound VAC to L residual limb Required Braces or Orthoses: Other Brace Other Brace: Limbguard LLE Restrictions Weight Bearing Restrictions: Yes LLE Weight Bearing: Non weight bearing Other Position/Activity Restrictions: R BK amp is healed; Prosthesis is new; he is on a wearing/breaking in schedule now: trial for 3 hours today 9:30am-12:30pm    Mobility  Bed Mobility Overal bed mobility: Needs Assistance Bed Mobility: Supine to Sit;Rolling Rolling: Mod assist;+2 for physical assistance   Supine to sit: Mod assist;+2 for physical assistance     General bed mobility comments: use of rails to elevate trunk and pads to move hips/pelvis to proper position  Transfers Overall transfer level: Needs assistance   Transfers: Sit to/from Stand Sit to Stand: Mod assist;+2 physical assistance;+2 safety/equipment;From elevated surface         General transfer comment: pt unable to stand enough on first attempt to clear  stedy seat; on second attempt able to place rt side of seat and then worked on rt wt-shift lean to get left side of seat under pt; able to stand 3/4 to remove flaps and sit in chair  Ambulation/Gait                 Stairs             Wheelchair Mobility    Modified Rankin (Stroke Patients Only)       Balance Overall balance assessment: Needs assistance Sitting-balance support: Bilateral upper extremity supported Sitting balance-Leahy Scale: Poor Sitting balance - Comments: Lots of work on upright sitting with ant post transfer and work in [employer]; Able to pull self from supported sitting  to unsupported sitting in recliner using armrests Postural control: Posterior lean Standing balance support: Bilateral upper extremity supported;During functional activity Standing balance-Leahy Scale: Zero                              Cognition Arousal/Alertness: Awake/alert Behavior During Therapy: WFL for tasks assessed/performed Overall Cognitive Status: Within Functional Limits for tasks assessed                                        Exercises      General Comments General comments (skin integrity, edema, etc.): required assist to don prosthesis (PT initiated silicone sleeve and pt able to complete; PT placed residual limb at entrance to prosthesis and pt then able  to press leg down into prosthesis); requires incr assist due to decr balance      Pertinent Vitals/Pain Pain Assessment: Faces Faces Pain Scale: Hurts even more Pain Location: L residual limb; When asked about pain, he says "hurts" -- but notable grimace with activity Pain Descriptors / Indicators: Sore;Grimacing Pain Intervention(s): Limited activity within patient's tolerance;Monitored during session;Patient requesting pain meds-RN notified    Home Living                      Prior Function            PT Goals (current goals can now be found in the care plan  section) Acute Rehab PT Goals Patient Stated Goal: to get back to rehab and then eventually home Time For Goal Achievement: 01/02/19 Potential to Achieve Goals: Fair Progress towards PT goals: Progressing toward goals    Frequency    Min 4X/week      PT Plan Current plan remains appropriate;Frequency needs to be updated    Co-evaluation PT/OT/SLP Co-Evaluation/Treatment: Yes Reason for Co-Treatment: Complexity of the patient's impairments (multi-system involvement);For patient/therapist safety;To address functional/ADL transfers PT goals addressed during session: Mobility/safety with mobility;Balance        AM-PAC PT "6 Clicks" Mobility   Outcome Measure  Help needed turning from your back to your side while in a flat bed without using bedrails?: A Lot Help needed moving from lying on your back to sitting on the side of a flat bed without using bedrails?: A Lot Help needed moving to and from a bed to a chair (including a wheelchair)?: A Lot Help needed standing up from a chair using your arms (e.g., wheelchair or bedside chair)?: Total Help needed to walk in hospital room?: Total Help needed climbing 3-5 steps with a railing? : Total 6 Click Score: 9    End of Session Equipment Utilized During Treatment: Gait belt Activity Tolerance: Patient tolerated treatment well Patient left: in chair;with call bell/phone within reach;with chair alarm set;with nursing/sitter in room Nurse Communication: Mobility status;Need for lift equipment(recommend maxi-move or anterior transfer back to bed) PT Visit Diagnosis: Other abnormalities of gait and mobility (R26.89) Pain - Right/Left: Left Pain - part of body: Leg     Time: 2831-5176 PT Time Calculation (min) (ACUTE ONLY): 26 min  Charges:  $Therapeutic Activity: 8-22 mins                      Barry Brunner, PT Pager [phone]    [name] 12/24/2018, 5:59 PM

## 2018-12-24 NOTE — Plan of Care (Addendum)
ADDENDUM: Bladder scanned around 1750, 603 mL retention. In and Out Cath 450 mL. MD notified, will continue to monitor.   Problem: Education: Goal: Knowledge of General Education information will improve Description: Including pain rating scale, medication(s)/side effects and non-pharmacologic comfort measures Outcome: Progressing   Problem: Health Behavior/Discharge Planning: Goal: Ability to manage health-related needs will improve Outcome: Progressing   Problem: Clinical Measurements: Goal: Will remain free from infection Outcome: Progressing   Problem: Activity: Goal: Risk for activity intolerance will decrease Outcome: Progressing   Problem: Coping: Goal: Level of anxiety will decrease Outcome: Progressing   Problem: Pain Managment: Goal: General experience of comfort will improve Outcome: Progressing   Problem: Safety: Goal: Ability to remain free from injury will improve Outcome: Progressing

## 2018-12-24 NOTE — Progress Notes (Signed)
More awake today. Shield and vac in place. 0cc  VSS Afebrile. Will await on CIR appeal today. If approved I will tansfer today. Ordered new COVID for tomorrow if patient doesn't get [employer] fast streamer for [employer]

## 2018-12-24 NOTE — Progress Notes (Signed)
Please let providers know, make them aware, that pt incompletely empties his bladder when he voids- he was requiring in/out caths for volumes >300 cc while in rehab, and although he's voiding now, he has very little control of it, which argues that's it's overflow voiding and needs to be in/out cathed- he also had 2 UTIs during time out of the hospital after last leg amputated, which again argues he needs to be cathed... Please evaluate, at least by bladder scanning pt- his son has told me he's explained this to nursing multiple times and nothing has occurred.   Thank you.

## 2018-12-24 NOTE — Progress Notes (Signed)
Inpatient Rehabilitation Admissions Coordinator  I await decision from  expedited appeal. I met with patient and his son, [name], at bedside and they are aware.  Danne Baxter, RN, MSN Rehab Admissions Coordinator [phone] 12/24/2018 10:38 AM

## 2018-12-24 NOTE — Care Management Important Message (Signed)
Important Message  Patient Details  Name: [name] MRN: [id number] Date of Birth: Jun 03, 1927   Medicare Important Message Given:  Yes     Memory Argue 12/24/2018, 2:57 PM

## 2018-12-24 NOTE — Telephone Encounter (Signed)
I called and spoke to him

## 2018-12-25 ENCOUNTER — Other Ambulatory Visit: Payer: Self-pay

## 2018-12-25 ENCOUNTER — Encounter (HOSPITAL_COMMUNITY): Payer: Self-pay | Admitting: Physical Medicine & Rehabilitation

## 2018-12-25 ENCOUNTER — Inpatient Hospital Stay (HOSPITAL_COMMUNITY)
Admission: RE | Admit: 2018-12-25 | Discharge: 2019-01-05 | DRG: 560 | Disposition: A | Discharge status: Home Health | Payer: Medicare Other | Source: Intra-hospital | Attending: Physical Medicine and Rehabilitation | Admitting: Physical Medicine and Rehabilitation

## 2018-12-25 DIAGNOSIS — Y835 Amputation of limb(s) as the cause of abnormal reaction of the patient, or of later complication, without mention of misadventure at the time of the procedure: Secondary | ICD-10-CM | POA: Diagnosis present

## 2018-12-25 DIAGNOSIS — Z87891 Personal history of nicotine dependence: Secondary | ICD-10-CM

## 2018-12-25 DIAGNOSIS — Z7984 Long term (current) use of oral hypoglycemic drugs: Secondary | ICD-10-CM | POA: Diagnosis not present

## 2018-12-25 DIAGNOSIS — N401 Enlarged prostate with lower urinary tract symptoms: Secondary | ICD-10-CM | POA: Diagnosis present

## 2018-12-25 DIAGNOSIS — S88112A Complete traumatic amputation at level between knee and ankle, left lower leg, initial encounter: Secondary | ICD-10-CM | POA: Diagnosis not present

## 2018-12-25 DIAGNOSIS — I739 Peripheral vascular disease, unspecified: Secondary | ICD-10-CM | POA: Diagnosis not present

## 2018-12-25 DIAGNOSIS — Z8249 Family history of ischemic heart disease and other diseases of the circulatory system: Secondary | ICD-10-CM | POA: Diagnosis not present

## 2018-12-25 DIAGNOSIS — E785 Hyperlipidemia, unspecified: Secondary | ICD-10-CM | POA: Diagnosis present

## 2018-12-25 DIAGNOSIS — Z87440 Personal history of urinary (tract) infections: Secondary | ICD-10-CM

## 2018-12-25 DIAGNOSIS — I5032 Chronic diastolic (congestive) heart failure: Secondary | ICD-10-CM | POA: Diagnosis present

## 2018-12-25 DIAGNOSIS — R338 Other retention of urine: Secondary | ICD-10-CM | POA: Diagnosis not present

## 2018-12-25 DIAGNOSIS — M25531 Pain in right wrist: Secondary | ICD-10-CM | POA: Diagnosis present

## 2018-12-25 DIAGNOSIS — E1151 Type 2 diabetes mellitus with diabetic peripheral angiopathy without gangrene: Secondary | ICD-10-CM | POA: Diagnosis present

## 2018-12-25 DIAGNOSIS — K5900 Constipation, unspecified: Secondary | ICD-10-CM | POA: Diagnosis not present

## 2018-12-25 DIAGNOSIS — R339 Retention of urine, unspecified: Secondary | ICD-10-CM | POA: Diagnosis not present

## 2018-12-25 DIAGNOSIS — E119 Type 2 diabetes mellitus without complications: Secondary | ICD-10-CM | POA: Diagnosis not present

## 2018-12-25 DIAGNOSIS — Z993 Dependence on wheelchair: Secondary | ICD-10-CM

## 2018-12-25 DIAGNOSIS — R310 Gross hematuria: Secondary | ICD-10-CM | POA: Diagnosis present

## 2018-12-25 DIAGNOSIS — H409 Unspecified glaucoma: Secondary | ICD-10-CM | POA: Diagnosis present

## 2018-12-25 DIAGNOSIS — Z79890 Hormone replacement therapy (postmenopausal): Secondary | ICD-10-CM

## 2018-12-25 DIAGNOSIS — Z89512 Acquired absence of left leg below knee: Secondary | ICD-10-CM

## 2018-12-25 DIAGNOSIS — N184 Chronic kidney disease, stage 4 (severe): Secondary | ICD-10-CM | POA: Diagnosis present

## 2018-12-25 DIAGNOSIS — Z7901 Long term (current) use of anticoagulants: Secondary | ICD-10-CM

## 2018-12-25 DIAGNOSIS — Z7189 Other specified counseling: Secondary | ICD-10-CM

## 2018-12-25 DIAGNOSIS — E1152 Type 2 diabetes mellitus with diabetic peripheral angiopathy with gangrene: Secondary | ICD-10-CM | POA: Diagnosis present

## 2018-12-25 DIAGNOSIS — I480 Paroxysmal atrial fibrillation: Secondary | ICD-10-CM | POA: Diagnosis present

## 2018-12-25 DIAGNOSIS — E1122 Type 2 diabetes mellitus with diabetic chronic kidney disease: Secondary | ICD-10-CM | POA: Diagnosis present

## 2018-12-25 DIAGNOSIS — N183 Chronic kidney disease, stage 3 unspecified: Secondary | ICD-10-CM | POA: Diagnosis present

## 2018-12-25 DIAGNOSIS — T83031A Leakage of indwelling urethral catheter, initial encounter: Secondary | ICD-10-CM | POA: Diagnosis present

## 2018-12-25 DIAGNOSIS — Z7902 Long term (current) use of antithrombotics/antiplatelets: Secondary | ICD-10-CM

## 2018-12-25 DIAGNOSIS — Y738 Miscellaneous gastroenterology and urology devices associated with adverse incidents, not elsewhere classified: Secondary | ICD-10-CM | POA: Diagnosis present

## 2018-12-25 DIAGNOSIS — T8781 Dehiscence of amputation stump: Secondary | ICD-10-CM | POA: Diagnosis present

## 2018-12-25 DIAGNOSIS — Z89511 Acquired absence of right leg below knee: Secondary | ICD-10-CM

## 2018-12-25 DIAGNOSIS — D62 Acute posthemorrhagic anemia: Secondary | ICD-10-CM | POA: Diagnosis not present

## 2018-12-25 DIAGNOSIS — I130 Hypertensive heart and chronic kidney disease with heart failure and stage 1 through stage 4 chronic kidney disease, or unspecified chronic kidney disease: Secondary | ICD-10-CM | POA: Diagnosis not present

## 2018-12-25 DIAGNOSIS — Z79899 Other long term (current) drug therapy: Secondary | ICD-10-CM

## 2018-12-25 DIAGNOSIS — Z4781 Encounter for orthopedic aftercare following surgical amputation: Secondary | ICD-10-CM | POA: Diagnosis not present

## 2018-12-25 DIAGNOSIS — E1159 Type 2 diabetes mellitus with other circulatory complications: Secondary | ICD-10-CM | POA: Diagnosis present

## 2018-12-25 DIAGNOSIS — N400 Enlarged prostate without lower urinary tract symptoms: Secondary | ICD-10-CM | POA: Diagnosis present

## 2018-12-25 DIAGNOSIS — R8281 Pyuria: Secondary | ICD-10-CM | POA: Diagnosis not present

## 2018-12-25 DIAGNOSIS — Z823 Family history of stroke: Secondary | ICD-10-CM

## 2018-12-25 DIAGNOSIS — Z7982 Long term (current) use of aspirin: Secondary | ICD-10-CM

## 2018-12-25 DIAGNOSIS — R8290 Unspecified abnormal findings in urine: Secondary | ICD-10-CM

## 2018-12-25 LAB — GLUCOSE, CAPILLARY
Glucose-Capillary: 149 mg/dL — ABNORMAL HIGH (ref 70–99)
Glucose-Capillary: 106 mg/dL — ABNORMAL HIGH (ref 70–99)
Glucose-Capillary: 131 mg/dL — ABNORMAL HIGH (ref 70–99)
Glucose-Capillary: 138 mg/dL — ABNORMAL HIGH (ref 70–99)

## 2018-12-25 MED ORDER — TRAMADOL HCL 50 MG PO TABS
50.0000 mg | ORAL_TABLET | Freq: Four times a day (QID) | ORAL | Status: DC | PRN
  Administered 2018-12-26 – 2019-01-04 (×6): 50 mg via ORAL
  Filled 2018-12-25 (×6): qty 1

## 2018-12-25 MED ORDER — POTASSIUM CHLORIDE CRYS ER 10 MEQ PO TBCR
10.0000 meq | EXTENDED_RELEASE_TABLET | Freq: Every day | ORAL | Status: DC
  Administered 2018-12-26 – 2019-01-05 (×11): 10 meq via ORAL
  Filled 2018-12-25 (×11): qty 1

## 2018-12-25 MED ORDER — TAMSULOSIN HCL 0.4 MG PO CAPS
0.4000 mg | ORAL_CAPSULE | Freq: Two times a day (BID) | ORAL | Status: DC
  Administered 2018-12-25 – 2019-01-05 (×22): 0.4 mg via ORAL
  Filled 2018-12-25 (×22): qty 1

## 2018-12-25 MED ORDER — LIDOCAINE HCL URETHRAL/MUCOSAL 2 % EX GEL: CUTANEOUS | Status: DC | PRN

## 2018-12-25 MED ORDER — VITAMIN C 500 MG PO TABS
500.0000 mg | ORAL_TABLET | Freq: Every day | ORAL | Status: DC
  Administered 2018-12-26 – 2019-01-05 (×11): 500 mg via ORAL
  Filled 2018-12-25 (×11): qty 1

## 2018-12-25 MED ORDER — FAMOTIDINE 20 MG PO TABS
20.0000 mg | ORAL_TABLET | Freq: Every day | ORAL | Status: DC
  Administered 2018-12-26 – 2019-01-05 (×11): 20 mg via ORAL
  Filled 2018-12-25 (×11): qty 1

## 2018-12-25 MED ORDER — DORZOLAMIDE HCL-TIMOLOL MAL 2-0.5 % OP SOLN
1.0000 [drp] | Freq: Two times a day (BID) | OPHTHALMIC | Status: DC
  Administered 2018-12-25 – 2019-01-05 (×22): 1 [drp] via OPHTHALMIC
  Filled 2018-12-25: qty 10

## 2018-12-25 MED ORDER — SENNOSIDES-DOCUSATE SODIUM 8.6-50 MG PO TABS
2.0000 | ORAL_TABLET | Freq: Every day | ORAL | Status: DC
  Administered 2018-12-25 – 2019-01-04 (×11): 2 via ORAL
  Filled 2018-12-25 (×12): qty 2

## 2018-12-25 MED ORDER — ONDANSETRON HCL 4 MG PO TABS: 4.0000 mg | ORAL_TABLET | Freq: Four times a day (QID) | ORAL | Status: DC | PRN

## 2018-12-25 MED ORDER — VITAMIN B-12 1000 MCG PO TABS
1000.0000 ug | ORAL_TABLET | Freq: Every day | ORAL | Status: DC
  Administered 2018-12-26 – 2019-01-05 (×11): 1000 ug via ORAL
  Filled 2018-12-25 (×11): qty 1

## 2018-12-25 MED ORDER — TRAZODONE HCL 50 MG PO TABS: 25.0000 mg | ORAL_TABLET | Freq: Every evening | ORAL | Status: DC | PRN

## 2018-12-25 MED ORDER — LEVOTHYROXINE SODIUM 25 MCG PO TABS
50.0000 ug | ORAL_TABLET | Freq: Every day | ORAL | Status: DC
  Administered 2018-12-26 – 2019-01-05 (×11): 50 ug via ORAL
  Filled 2018-12-25 (×11): qty 2

## 2018-12-25 MED ORDER — METHOCARBAMOL 500 MG PO TABS
250.0000 mg | ORAL_TABLET | Freq: Four times a day (QID) | ORAL | Status: DC | PRN
  Administered 2018-12-26: 250 mg via ORAL
  Filled 2018-12-25: qty 1

## 2018-12-25 MED ORDER — PRO-STAT SUGAR FREE PO LIQD
30.0000 mL | Freq: Two times a day (BID) | ORAL | Status: DC
  Administered 2018-12-25 – 2019-01-05 (×22): 30 mL via ORAL
  Filled 2018-12-25 (×22): qty 30

## 2018-12-25 MED ORDER — APIXABAN 5 MG PO TABS
5.0000 mg | ORAL_TABLET | Freq: Two times a day (BID) | ORAL | Status: DC
  Administered 2018-12-25 – 2019-01-05 (×22): 5 mg via ORAL
  Filled 2018-12-25 (×22): qty 1

## 2018-12-25 MED ORDER — DOCUSATE SODIUM 100 MG PO CAPS
100.0000 mg | ORAL_CAPSULE | Freq: Two times a day (BID) | ORAL | Status: DC
  Administered 2018-12-25 – 2019-01-05 (×22): 100 mg via ORAL
  Filled 2018-12-25 (×22): qty 1

## 2018-12-25 MED ORDER — POLYETHYLENE GLYCOL 3350 17 G PO PACK: 17.0000 g | PACK | Freq: Every day | ORAL | Status: DC | PRN

## 2018-12-25 MED ORDER — CLOPIDOGREL BISULFATE 75 MG PO TABS
75.0000 mg | ORAL_TABLET | Freq: Every day | ORAL | Status: DC
  Administered 2018-12-26 – 2019-01-05 (×11): 75 mg via ORAL
  Filled 2018-12-25 (×11): qty 1

## 2018-12-25 MED ORDER — INSULIN ASPART 100 UNIT/ML ~~LOC~~ SOLN
0.0000 [IU] | Freq: Three times a day (TID) | SUBCUTANEOUS | Status: DC
  Administered 2018-12-25 – 2019-01-01 (×7): 2 [IU] via SUBCUTANEOUS
  Administered 2019-01-04: 3 [IU] via SUBCUTANEOUS

## 2018-12-25 MED ORDER — FINASTERIDE 5 MG PO TABS
5.0000 mg | ORAL_TABLET | Freq: Every evening | ORAL | Status: DC
  Administered 2018-12-25 – 2019-01-04 (×11): 5 mg via ORAL
  Filled 2018-12-25 (×11): qty 1

## 2018-12-25 MED ORDER — ASPIRIN EC 81 MG PO TBEC
81.0000 mg | DELAYED_RELEASE_TABLET | Freq: Every day | ORAL | Status: DC
  Administered 2018-12-26 – 2019-01-05 (×11): 81 mg via ORAL
  Filled 2018-12-25 (×11): qty 1

## 2018-12-25 MED ORDER — GLIPIZIDE 5 MG PO TABS
5.0000 mg | ORAL_TABLET | Freq: Two times a day (BID) | ORAL | Status: DC
  Administered 2018-12-25 – 2019-01-05 (×22): 5 mg via ORAL
  Filled 2018-12-25 (×24): qty 1

## 2018-12-25 MED ORDER — ATORVASTATIN CALCIUM 40 MG PO TABS
40.0000 mg | ORAL_TABLET | Freq: Every day | ORAL | Status: DC
  Administered 2018-12-26 – 2019-01-05 (×11): 40 mg via ORAL
  Filled 2018-12-25 (×11): qty 1

## 2018-12-25 MED ORDER — ACETAMINOPHEN 325 MG PO TABS
325.0000 mg | ORAL_TABLET | ORAL | Status: DC | PRN
  Administered 2018-12-25 – 2018-12-26 (×2): 650 mg via ORAL
  Administered 2018-12-28: 325 mg via ORAL
  Administered 2019-01-01: 650 mg via ORAL
  Filled 2018-12-25 (×2): qty 2
  Filled 2018-12-25: qty 1
  Filled 2018-12-25: qty 2

## 2018-12-25 MED ORDER — POLYSACCHARIDE IRON COMPLEX 150 MG PO CAPS
150.0000 mg | ORAL_CAPSULE | Freq: Every day | ORAL | Status: DC
  Administered 2018-12-26 – 2019-01-05 (×11): 150 mg via ORAL
  Filled 2018-12-25 (×11): qty 1

## 2018-12-25 MED ORDER — FUROSEMIDE 40 MG PO TABS
40.0000 mg | ORAL_TABLET | Freq: Every day | ORAL | Status: DC
  Administered 2018-12-26 – 2019-01-05 (×11): 40 mg via ORAL
  Filled 2018-12-25 (×11): qty 1

## 2018-12-25 MED ORDER — BISACODYL 10 MG RE SUPP: 10.0000 mg | Freq: Every day | RECTAL | Status: DC | PRN

## 2018-12-25 MED ORDER — FLEET ENEMA 7-19 GM/118ML RE ENEM: 1.0000 | ENEMA | Freq: Once | RECTAL | Status: DC | PRN

## 2018-12-25 MED ORDER — ONDANSETRON HCL 4 MG/2ML IJ SOLN: 4.0000 mg | Freq: Four times a day (QID) | INTRAMUSCULAR | Status: DC | PRN

## 2018-12-25 MED ORDER — ALUM & MAG HYDROXIDE-SIMETH 200-200-20 MG/5ML PO SUSP: 30.0000 mL | ORAL | Status: DC | PRN

## 2018-12-25 NOTE — Plan of Care (Signed)
  Problem: Education: Goal: Knowledge of General Education information will improve Description: Including pain rating scale, medication(s)/side effects and non-pharmacologic comfort measures Outcome: Progressing   Problem: Pain Managment: Goal: General experience of comfort will improve Outcome: Progressing   

## 2018-12-25 NOTE — Progress Notes (Signed)
Report given to [facility], The nurse receiving pt.

## 2018-12-25 NOTE — Progress Notes (Signed)
Izora Ribas, MD  Physician  Physical Medicine and Rehabilitation  PMR Pre-admission  Signed  Date of Service:  12/22/2018  5:59 PM      Related encounter: Admission (Current) from 12/18/2018 in [place]        Show:Clear all _0 Manual_1 Template_2 Copied  Added by: _3 Cristina Gong, RN_4 Ranell Patrick Clide Deutscher, MD  _5 [place]over for details PMR Admission Coordinator Pre-Admission Assessment   Patient: [name] is an 85 y.o., male MRN: [id number] DOB: [birth year] Height: _6  (180.3 cm) Weight: 92.5 kg   Insurance Information HMO: yes    PPO:      PCP:      IPA:      80/20:      OTHER: medicare advantage plan PRIMARY: [employer] Medicare      Policy#: [id number]      Subscriber: pt CM Name: Altha Harm at [facility] appeals department      Phone#: [phone]    Fax#: [phone] Pre-Cert#: [id number] denied and expedited appeal approved for 7 days with f/u fax [phone]      Employer:  Benefits:  Phone #: online     Name: 12/8 Eff. Date: 01/14/2018     Deduct: none      Out of Pocket Max: $3600      Life Max: none CIR: $295 co pay per day days 1 until 5      SNF: no copy per day days 1 until 20; $160 co pay per day days 21 until 43; no co pay days 44 until 100 Outpatient: $30 co pay per visit     Co-Pay: visits per medical neccesity Home Health: 100%      Co-Pay: visits per medical neccesity DME: 80%     Co-Pay: 20% Providers: in network  SECONDARY: none       Medicaid Application Date:       Case Manager:  Disability Application Date:       Case Worker:    The "Data Collection Information Summary" for patients in Inpatient Rehabilitation Facilities with attached "Privacy Act [place] Records" was provided and verbally reviewed with: Patient and Family   Emergency Contact Information         Contact Information     Name Relation Home Work [place], [place] Son     [phone]   [name] Daughter     [phone]    Myers,Betty Sister [phone]        Ormond, Lazo     [phone]         Current Medical History  Patient Admitting Diagnosis: bilateral BKA   History of Present Illness:85 y.o. male with history of T2DM, HTN, PAF, HTN  PVD s/p R-BKA and left foot ulcer who was admitted on 11/18/18 with progressive redness of left foot with concerns of osteomyelitis. He underwent revascularization with arthrectomy of distal L-SFA and popliteal artery 11/23/18 as well as stenting with angioplasty of distal left PA in attempts of limb salvage.   He underwent left transmetatarsal amputation with placement of wound VAC on 12/02/18 by Dr. Sharol Given. He was made NWB and wound VAC was removed prior to discharge to CIR. Hospital course significant for leucocytosis with CXR showing worsening of bilateral lung opacities and he was started on Ceftriaxone and Zithromax to complete 5 day antibiotic course. Admitted to CIR on 12/09/2018.   On CIR he was maintained on ceftriaxone through 11/29. He was  noted to have peripheral and presacral edema therefore received 40 mg IV lasix. Intermittent urinary retention requiring I and O caths 1 to 2 times per day with PVR > 350 cc. His prosthesis was delivered for his other BKA and training was initiated with focus on NWB LLE> His foot progressed with ischemia along the wound edges. Dr. Sharol Given was contacted and patient was discharged to acute for left BKA on 12/18/2018.   Postoperatively patient with some urinary retention and constipation noted. VAC remains in place.      Patient's medical record from [facility] has been reviewed by the rehabilitation admission coordinator and physician.   Past Medical History      Past Medical History:  Diagnosis Date  . Arthritis    . Borderline diabetic    . Diabetes mellitus without complication ([place])    . Glaucoma    . Hyperlipidemia    . Hypertension    . PVD (peripheral vascular disease)  ([place])    . Sleep apnea      Cpap ordered but doesnt use      Family History   family history includes Cancer in his mother; Heart attack in his brother, brother, and father; Stroke in his father.   Prior Rehab/Hospitalizations Has the patient had prior rehab or hospitalizations prior to admission? Yes CIR 11/25 then to acute 12/4 for surgery   Has the patient had major surgery during 100 days prior to admission? Yes              Current Medications   Current Facility-Administered Medications:  .  acetaminophen (TYLENOL) tablet 325-650 mg, 325-650 mg, Oral, Q6H PRN, Persons, Bevely Palmer, PA, 650 mg at 12/25/18 0447 .  apixaban (ELIQUIS) tablet 5 mg, 5 mg, Oral, BID, Persons, Bevely Palmer, [place], 5 mg at 12/25/18 0915 .  aspirin EC tablet 81 mg, 81 mg, Oral, Daily, Persons, Bevely Palmer, [place], 81 mg at 12/25/18 0915 .  atorvastatin (LIPITOR) tablet 40 mg, 40 mg, Oral, Daily, Persons, Bevely Palmer, [place], 40 mg at 12/25/18 0915 .  clopidogrel (PLAVIX) tablet 75 mg, 75 mg, Oral, Q breakfast, Persons, Bevely Palmer, [place], 75 mg at 12/25/18 0915 .  docusate sodium (COLACE) capsule 100 mg, 100 mg, Oral, BID, Persons, Bevely Palmer, PA, 100 mg at 12/25/18 0915 .  dorzolamide-timolol (COSOPT) 22.3-6.8 MG/ML ophthalmic solution 1 drop, 1 drop, Both Eyes, BID, Persons, Bevely Palmer, [place], 1 drop at 12/25/18 0916 .  famotidine (PEPCID) tablet 20 mg, 20 mg, Oral, Daily, Persons, Bevely Palmer, PA, 20 mg at 12/25/18 0915 .  finasteride (PROSCAR) tablet 5 mg, 5 mg, Oral, QPM, Persons, Bevely Palmer, PA, 5 mg at 12/24/18 1742 .  furosemide (LASIX) tablet 40 mg, 40 mg, Oral, Daily, Persons, Bevely Palmer, [place], 40 mg at 12/24/18 0954 .  glipiZIDE (GLUCOTROL) tablet 5 mg, 5 mg, Oral, BID, Persons, Bevely Palmer, PA, 5 mg at 12/25/18 0915 .  insulin aspart (novoLOG) injection 0-15 Units, 0-15 Units, Subcutaneous, TID WC, Persons, Bevely Palmer, [place], 2 Units at 12/25/18 0914 .  iron polysaccharides (NIFEREX) capsule 150 mg, 150 mg, Oral, Daily, Persons, Bevely Palmer, [place], 150 mg at 12/25/18 0915 .  lactated ringers infusion, , Intravenous, Continuous, Persons, Bevely Palmer, [place], Last Rate: 10 mL/hr at 12/18/18 2212, New Bag at 12/18/18 2212 .  levothyroxine (SYNTHROID) tablet 50 mcg, 50 mcg, Oral, QAC breakfast, Persons, Bevely Palmer, [place], 50 mcg at 12/25/18 0551 .  metoCLOPramide (REGLAN) tablet 5-10 mg, 5-10 mg, Oral, Q8H  PRN **OR** metoCLOPramide (REGLAN) injection 5-10 mg, 5-10 mg, Intravenous, Q8H PRN, Persons, Bevely Palmer, PA .  ondansetron (ZOFRAN) tablet 4 mg, 4 mg, Oral, Q6H PRN **OR** ondansetron (ZOFRAN) injection 4 mg, 4 mg, Intravenous, Q6H PRN, Persons, Bevely Palmer, PA .  polyethylene glycol (MIRALAX / GLYCOLAX) packet 17 g, 17 g, Oral, Daily PRN, Persons, Bevely Palmer, PA .  potassium chloride (KLOR-CON) CR tablet 10 mEq, 10 mEq, Oral, Daily, Persons, Bevely Palmer, PA, 10 mEq at 12/25/18 0915 .  senna-docusate (Senokot-S) tablet 1 tablet, 1 tablet, Oral, BID PRN, Persons, Bevely Palmer, [place], 1 tablet at 12/20/18 1101 .  tamsulosin (FLOMAX) capsule 0.4 mg, 0.4 mg, Oral, BID, Persons, Bevely Palmer, PA, 0.4 mg at 12/25/18 0915 .  traMADol (ULTRAM) tablet 50 mg, 50 mg, Oral, Q6H PRN, Marybelle Killings, MD, 50 mg at 12/23/18 0657 .  vitamin B-12 (CYANOCOBALAMIN) tablet 1,000 mcg, 1,000 mcg, Oral, Daily, Persons, Bevely Palmer, [place], 1,000 mcg at 12/25/18 0915 .  vitamin C (ASCORBIC ACID) tablet 500 mg, 500 mg, Oral, Daily, Persons, Bevely Palmer, PA, 500 mg at 12/25/18 0915   Patients Current Diet:     Diet Order                      Diet - low sodium heart healthy           Diet Carb Modified Fluid consistency: Thin; Room service appropriate? Yes  Diet effective now                   Precautions / Restrictions Precautions Precautions: Fall Precaution Comments: prior R transtibial amputation; wound VAC to L residual limb Other Brace: Limbguard LLE Restrictions Weight Bearing Restrictions: Yes LLE Weight Bearing: Non weight bearing Other Position/Activity Restrictions:  R BK amp is healed; Prosthesis is new; he is on a wearing/breaking in schedule now: trial for 3 hours today 9:30am-12:30pm    Has the patient had 2 or more falls or a fall with injury in the past year? No   Prior Activity Level  supervision wheelchair level since 02/2018. For 2 months pta, family using a sling at home to assist pt with standing transfers to wheelchair. Due to COVID issues had not been trained on his prosthetic leg for prior right BKA prior to 11/25 CIR admit.    Prior Functional Level Self Care: Did the patient need help bathing, dressing, using the toilet or eating? Needed some help   Indoor Mobility: Did the patient need assistance with walking from room to room (with or without device)? Needed some help   Stairs: Did the patient need assistance with internal or external stairs (with or without device)? Needed some help   Functional Cognition: Did the patient need help planning regular tasks such as shopping or remembering to take medications? Needed some help   Home Assistive Devices / [place] Devices/Equipment: Hospital bed, Wheelchair, Bedside commode/3-in-1(PROSTETIC LEG) Home Equipment: Environmental consultant - 2 wheels, Wheelchair - manual, Bedside commode, Hospital bed   Prior Device Use: Indicate devices/aids used by the patient prior to current illness, exacerbation or injury? Manual wheelchair   Current Functional Level Cognition   Overall Cognitive Status: Within Functional Limits for tasks assessed Orientation Level: Oriented X4 Following Commands: Follows one step commands with increased time Safety/Judgement: Decreased awareness of deficits    Extremity Assessment (includes Sensation/Coordination)   Upper Extremity Assessment: Generalized weakness  Lower Extremity Assessment: Generalized weakness, LLE deficits/detail, RLE deficits/detail RLE Deficits / Details: prior  transtibial amputation; skin well healed LLE Deficits / Details: pt demonstrated  decreased strength and ROM limitations secondary to post-op pain and weakness; wound VAC in place; pt able to achieve full knee extension and flexion to ~80 degrees     ADLs   Overall ADL's : Needs assistance/impaired Eating/Feeding: Sitting, Set up Grooming: Wash/dry hands, Wash/dry face, Oral care, Sitting, Min guard Grooming Details (indicate cue type and reason): unsupported sitting in recliner Upper Body Bathing: Minimal assistance, Sitting Upper Body Bathing Details (indicate cue type and reason): simulated, unsupported in recliner. pt with Poor sitting balance unnsupported Lower Body Bathing: Moderate assistance, Sitting/lateral leans Upper Body Dressing : Min guard, Sitting Lower Body Dressing: Maximal assistance Toilet Transfer: Moderate assistance, +2 for physical assistance Toilet Transfer Details (indicate cue type and reason): simulated to recliner with use of Stedy, mod A +2 sit - stnad Toileting- Clothing Manipulation and Hygiene: Total assistance, Sit to/from stand Functional mobility during ADLs: Moderate assistance, +2 for physical assistance, Cueing for safety General ADL Comments: pt participated in bathing and dressing seated in recliner     Mobility   Overal bed mobility: Needs Assistance Bed Mobility: Supine to Sit, Rolling Rolling: Mod assist, +2 for physical assistance Supine to sit: Mod assist, +2 for physical assistance Sit to supine: Min assist General bed mobility comments: use of rails to elevate trunk and pads to move hips/pelvis to proper position     Transfers   Overall transfer level: Needs assistance Equipment used: Rolling walker (2 wheeled) Transfer via Lift Equipment: Stedy Transfers: Sit to/from Stand Sit to Stand: Mod assist, +2 physical assistance, +2 safety/equipment, From elevated surface Anterior-Posterior transfers: Max assist, +2 safety/equipment  Lateral/Scoot Transfers: +2 safety/equipment, +2 physical assistance, Max assist General  transfer comment: pt unable to stand enough on first attempt to clear stedy seat; on second attempt able to place rt side of seat and then worked on rt wt-shift lean to get left side of seat under pt; able to stand 3/4 to remove flaps and sit in chair     Ambulation / Gait / Stairs / Proofreader / Balance Dynamic Sitting Balance Sitting balance - Comments: Lots of work on upright sitting with ant post transfer and work in Chief Executive Officer; Able to pull self from supported sitting  to unsupported sitting in recliner using armrests Balance Overall balance assessment: Needs assistance Sitting-balance support: Bilateral upper extremity supported Sitting balance-Leahy Scale: Poor Sitting balance - Comments: Lots of work on upright sitting with ant post transfer and work in Chief Executive Officer; Able to pull self from supported sitting  to unsupported sitting in recliner using armrests Postural control: Posterior lean Standing balance support: Bilateral upper extremity supported, During functional activity Standing balance-Leahy Scale: Zero     Special needs/care consideration BiPAP/CPAP  CPM  Continuous Drip IV  Dialysis         Days  Life Vest  Oxygen  Special Bed  Trach Size  Wound Vac (area) yes to surgical site Skin surgical site Bowel mgmt: continent LBM 12/8 constipated  Bladder mgmt: continent. On CIR admit 11/20 patient was I and O cathed usually twice per day due to prostate issues Diabetic mgmt: yes Behavioral consideration  Chemo/radiation  Designated visitor is daughter, [name]. [name] son is returning home to [place]: (patietn daughter and son alternate to care for patient in [place])  Lives With: Alone Available Help at Discharge:  Family, Available 24 hours/day Type of Home: House Home Layout: One level Home Access: Ramped entrance Bathroom Shower/Tub: Multimedia programmer: Standard Bathroom  Accessibility: Yes How Accessible: Accessible via walker [place]: Yes Type of Home Care Services: Home PT, [place] (if known): [facility] Additional Comments: hopeful to return to CIR   Discharge Living Setting Plans for Discharge Living Setting: Patient's home, Alone Type of Home at Discharge: House Discharge Home Layout: One level Discharge Home Access: [place] entrance Discharge Bathroom Shower/Tub: Walk-in shower Discharge Bathroom Toilet: Standard Discharge Bathroom Accessibility: Yes How Accessible: Accessible via walker Does the patient have any problems obtaining your medications?: No   Social/Family/Support Systems Patient Roles: Parent Contact Information: son and daughter Anticipated Caregiver: sons and daughter Anticipated Caregiver's Contact Information: see above Ability/Limitations of Caregiver: Daughter and her family recently with COVID 19, son [name]) does NOT have COVID Caregiver Availability: 24/7 Discharge Plan Discussed with Primary Caregiver: Yes Is Caregiver In Agreement with Plan?: Yes Does Caregiver/Family have Issues with Lodging/Transportation while Pt is in Rehab?: No   Son, [name] returning to [place] now due to fatigue. Daughter, [name] to be his visitor now at [facility]   Goals/Additional Needs Patient/Family Goal for Rehab: min asisst with PT and OT at wheelchair level Expected length of stay: ELOS 7 to 10 days Special Service Needs: [facility] Pt/Family Agrees to Admission and willing to participate: Yes Program Orientation Provided & Reviewed with Pt/Caregiver Including Roles  & Responsibilities: Yes   Decrease burden of Care through IP rehab admission:    Possible need for SNF placement upon discharge: not anticipated   Patient Condition: I have reviewed medical records from [facility] , spoken with CM, and patient and son. I met with patient at the bedside for inpatient rehabilitation assessment.  Patient will  benefit from ongoing PT and OT, can actively participate in 3 hours of therapy a day 5 days of the week, and can make measurable gains during the admission.  Patient will also benefit from the coordinated team approach during an Inpatient Acute Rehabilitation admission.  The patient will receive intensive therapy as well as Rehabilitation physician, nursing, social worker, and care management interventions.  Due to bladder management, bowel management, safety, skin/wound care, disease management, medication administration, pain management and patient education the patient requires 24 hour a day rehabilitation nursing.  The patient is currently mod assist with mobility and basic ADLs.  Discharge setting and therapy post discharge at home with home health is anticipated.  Patient has agreed to participate in the Acute Inpatient Rehabilitation Program and will admit today.   Preadmission Screen Completed By:  Cleatrice Burke, 12/25/2018 10:38 AM ______________________________________________________________________   Discussed status with Dr. Ranell Patrick on  12/25/2018 at  1042 and received approval for admission today.   Admission Coordinator:  Cleatrice Burke, RN, time 4010 Date  12/25/2018    Assessment/Plan: Diagnosis: Impaired mobility and ADLs following dehiscence of amputation stump 1. Does the need for close, 24 hr/day Medical supervision in concert with the patient's rehab needs make it unreasonable for this patient to be served in a less intensive setting? Yes 2. Co-Morbidities requiring supervision/potential complications: Left BKA, overweight, pain, HLD, CAD, PAF, HTN, moderate aortic stenosis 3. Due to bladder management, bowel management, safety, skin/wound care, disease management, medication administration, pain management and patient education, does the patient require 24 hr/day rehab nursing? Yes 4. Does the patient require coordinated care of a physician, rehab nurse, PT, OT,  and SLP to address physical and functional deficits in the context of the above medical diagnosis(es)? Yes Addressing deficits in the following areas: balance, endurance, locomotion, strength, transferring, bowel/bladder control, bathing, dressing, feeding, grooming, toileting and psychosocial support 5. Can the patient actively participate in an intensive therapy program of at least 3 hrs of therapy 5 days a week? Yes 6. The potential for patient to make measurable gains while on inpatient rehab is excellent 7. Anticipated functional outcomes upon discharge from inpatient rehab: min assist PT, min assist OT, modified independent SLP 8. Estimated rehab length of stay to reach the above functional goals is: 10-14 days 9. Anticipated discharge destination: Home 10. Overall Rehab/Functional Prognosis: excellent     MD Signature: Leeroy Cha, MD        Revision History

## 2018-12-25 NOTE — TOC Transition Note (Signed)
Transition of Care [facility]) - CM/SW Discharge Note   Patient Details  Name: [name] MRN: [id number] Date of Birth: 1927-01-16  Transition of Care [facility]) CM/SW Contact:  Midge Minium MSN, RN, NCM-BC, ACM-RN [phone] Phone Number: 12/25/2018, 11:29 AM   Clinical Narrative:    Per Pamala Hurry RN [facility]) the patient received approval on the expedited appeal and can admit to CIR today. No further needs from CM.    Final next level of care: IP Rehab Facility Barriers to Discharge: No Barriers Identified   Patient Goals and CMS Choice   CMS Medicare.gov Compare Post Acute Care list provided to:: Patient(Coordinated by the Rehab [facility]) Choice offered to / list presented to : Patient(Coordinated by the Rehab [facility])   Discharge Plan and Services In-house Referral: Clinical Social Work Discharge Planning Services: CM Consult Post Acute Care Choice: Home Health          DME Arranged: N/A DME Agency: NA       HH Arranged: NA HH Agency: NA        Social Determinants of Health (SDOH) Interventions     Readmission Risk Interventions Readmission Risk Prevention Plan 12/22/2018 11/26/2018 11/21/2018  Transportation Screening Complete Complete Complete  PCP or Specialist Appt within 3-5 Days - - -  Not Complete comments - - -  HRI or [place] or Home Care Consult comments - - -  Social Work Consult for [place] Planning/Counseling - - -  [place] - - -  Medication Review ([place]) Complete Complete Referral to Pharmacy  PCP or Specialist appointment within 3-5 days of discharge Complete Complete Complete  HRI or [place] Complete Complete Complete  SW Recovery Care/Counseling Consult Complete Complete Complete  Palliative Care Screening Not Applicable Not Applicable Not [place] Patient Refused Not Applicable Not Complete  Some recent data might be hidden

## 2018-12-25 NOTE — Progress Notes (Signed)
Inpatient Rehabilitation Admissions Coordinator  I have received approval to admit to CIR on expedited appeal. I met with pt and son at bedside and they are aware. I have contacted Persons PA and will make arrangements to admit today. Visitor will be now his daughter, [name] per son's request. RN CM made aware.  Danne Baxter, RN, MSN Rehab Admissions Coordinator [phone] 12/25/2018 10:33 AM

## 2018-12-25 NOTE — Progress Notes (Signed)
Pt admitted to room 4M06. Pt's son present at time of admission and states that he'd like to change designated visitor to pt's daughter, [name]. Pt oriented to room, floor, call bell and rehab policy. Pt denies any pain or discomfort at this time.   [name], RN

## 2018-12-25 NOTE — H&P (Signed)
Physical Medicine and Rehabilitation Admission H&P    CC: L-BKA and history of recent R-BKA   HPI: [name] is an 85 year old male with history of T2DM, PAF, recent admissions for PVD s/p R-BKA as well as acute on chronic diastolic CHF who was originally admitted on 11/23/18 with osteomyelitis right foot and underwent revascularization of distal L-SFA and popliteal artery with left transmetatarsal amputation 12/02/18 in attempts of limb salvage. He was admitted on CIR for rehab 11/25 and has had progressive ischemic changes of wound. Hospital course also significant for intermittent urinary retention, fluid overload, fluctuating WBC and pain.  Dr. Sharol Given consulted and felt that limb not salvageable therefore patient taken to OR 12/18/18 for L-BKA. Post op  ABLA being monitored and has had asymptomatic bradycardia. Therapy ongoing and CIR recommended for resumption of therapy.     Review of Systems  Constitutional: Negative for chills and fever.  HENT: Positive for hearing loss. Negative for ear pain and tinnitus.   Eyes: Negative for blurred vision and double vision.  Respiratory: Negative for cough, shortness of breath and wheezing.   Cardiovascular: Negative for chest pain and palpitations.  Gastrointestinal: Negative for constipation and heartburn.  Genitourinary: Negative for dysuria.  Musculoskeletal: Positive for joint pain.  Skin: Negative for itching and rash.  Neurological: Positive for weakness. Negative for dizziness and headaches.  Psychiatric/Behavioral: Negative for depression and suicidal ideas. The patient is not nervous/anxious.      Past Medical History:  Diagnosis Date  . Arthritis   . Borderline diabetic   . Diabetes mellitus without complication ([place])   . Glaucoma   . Hyperlipidemia   . Hypertension   . PVD (peripheral vascular disease) ([place])   . Sleep apnea    Cpap ordered but doesnt use    Past Surgical History:  Procedure Laterality Date  .  ABDOMINAL AORTOGRAM W/LOWER EXTREMITY N/A 07/27/2018   Procedure: ABDOMINAL AORTOGRAM W/LOWER EXTREMITY;  Surgeon: Lorretta Harp, MD;  Location: [facility];  Service: Cardiovascular;  Laterality: N/A;  . AMPUTATION Right 08/28/2018   Procedure: RIGHT BELOW KNEE AMPUTATION;  Surgeon: [name], MD;  Location: [facility];  Service: Orthopedics;  Laterality: Right;  . AMPUTATION Left 12/02/2018   Procedure: LEFT TRANSMETATARSAL AMPUTATION AND NEGATIVE PRESSURE WOUND VAC PLACEMENT;  Surgeon: [name], MD;  Location: [facility];  Service: Orthopedics;  Laterality: Left;  . AMPUTATION Left 12/18/2018   Procedure: LEFT BELOW KNEE AMPUTATION;  Surgeon: [name], MD;  Location: [facility];  Service: Orthopedics;  Laterality: Left;  . APPENDECTOMY  1943  . BELOW KNEE LEG AMPUTATION Left 12/18/2018  . CATARACT EXTRACTION  2012   x2  . COLONOSCOPY N/A 11/01/2014   Procedure: COLONOSCOPY;  Surgeon: Aviva Signs, MD;  [facility];  Service: Gastroenterology;  Laterality: N/A;  . ESOPHAGOGASTRODUODENOSCOPY N/A 11/01/2014   Procedure: ESOPHAGOGASTRODUODENOSCOPY (EGD);  Surgeon: Aviva Signs, MD;  [facility];  Service: Gastroenterology;  Laterality: N/A;  . HERNIA REPAIR Left   . INGUINAL HERNIA REPAIR Right 03/01/2013   Procedure: RIGHT INGUINAL HERNIORRHAPHY;  Surgeon: Jamesetta So, MD;  Location: [facility];  Service: General;  Laterality: Right;  . INSERTION OF MESH Right 03/01/2013   Procedure: INSERTION OF MESH;  Surgeon: Jamesetta So, MD;  Location: [facility];  Service: General;  Laterality: Right;  . LOWER EXTREMITY ANGIOGRAPHY Right 08/25/2018   Procedure: LOWER EXTREMITY ANGIOGRAPHY;  Surgeon: Wellington Hampshire, MD;  Location:  [facility];  Service: Cardiovascular;  Laterality: Right;  . LOWER EXTREMITY ANGIOGRAPHY N/A 11/25/2018   Procedure: LOWER EXTREMITY ANGIOGRAPHY;  Surgeon: Wellington Hampshire, MD;  Location: [facility];  Service: Cardiovascular;   Laterality: N/A;  . NM MYOCAR PERF WALL MOTION  06/05/2010   no significant ischemia  . PERIPHERAL VASCULAR ATHERECTOMY Right 08/03/2018   Procedure: PERIPHERAL VASCULAR ATHERECTOMY;  Surgeon: Lorretta Harp, MD;  Location: [facility];  Service: Cardiovascular;  Laterality: Right;  . PERIPHERAL VASCULAR ATHERECTOMY Left 11/23/2018   Procedure: PERIPHERAL VASCULAR ATHERECTOMY;  Surgeon: Lorretta Harp, MD;  Location: [facility];  Service: Cardiovascular;  Laterality: Left;  sfa with dc balloon  . PERIPHERAL VASCULAR BALLOON ANGIOPLASTY Left 11/25/2018   Procedure: PERIPHERAL VASCULAR BALLOON ANGIOPLASTY;  Surgeon: Wellington Hampshire, MD;  Location: [facility];  Service: Cardiovascular;  Laterality: Left;  popliteal  . PERIPHERAL VASCULAR INTERVENTION Left 11/25/2018   Procedure: PERIPHERAL VASCULAR INTERVENTION;  Surgeon: Wellington Hampshire, MD;  Location: [facility];  Service: Cardiovascular;  Laterality: Left;  tibial peroneal trunk and peroneal stents   . stent  06/14/2010   left leg  . US ECHOCARDIOGRAPHY  01/21/2006   moderate mitral annular ca+, mild MR, AOV moderately sclerotic.    Family History  Problem Relation Age of Onset  . Cancer Mother   . Heart attack Father   . Stroke Father   . Heart attack Brother   . Heart attack Brother     Social History:   Widowed--has been wheelchair bound since R-BKA.  Children have been providing assistance since discharge. He reports that he quit smoking about 53 years ago. He has quit using smokeless tobacco.  His smokeless tobacco use included chew. He reports that he does not drink alcohol or use drugs.    Allergies  Allergen Reactions  . Iodinated Diagnostic Agents     Other reaction(s): Fever  . Dye Fdc Red [Red Dye] Hives    Tolerated red Docusate, Niferex  . Iodine-131 Other (See Comments)    Breakout, fever  . Iohexol      Code: RASH, Desc: PT STATES HOT ALL OVER HAD TO HAVE ICE PACKS ALL OVER HIM  AND DIFF BREATHING, PT REFUSED IV DYE     Medications Prior to Admission  Medication Sig Dispense Refill  . acetaminophen (TYLENOL) 325 MG tablet Take 1-2 tablets (325-650 mg total) by mouth every 6 (six) hours as needed for mild pain (pain score 1-3 or temp > 100.5).    [name] apixaban (ELIQUIS) 5 MG TABS tablet Take 1 tablet (5 mg total) by mouth 2 (two) times daily. 60 tablet   . aspirin EC 81 MG tablet Take 81 mg by mouth daily.    [name] atorvastatin (LIPITOR) 40 MG tablet Take 1 tablet (40 mg total) by mouth daily. 90 tablet 3  . clopidogrel (PLAVIX) 75 MG tablet Take 1 tablet (75 mg total) by mouth daily with breakfast.    . dorzolamide-timolol (COSOPT) 22.3-6.8 MG/ML ophthalmic solution Place 1 drop into both eyes 2 (two) times daily.    . famotidine (PEPCID) 20 MG tablet Take 1 tablet (20 mg total) by mouth daily. 60 tablet 0  . finasteride (PROSCAR) 5 MG tablet Take 1 tablet (5 mg total) by mouth every evening. 30 tablet 0  . furosemide (LASIX) 40 MG tablet Take 1 tablet (40 mg total) by mouth daily. 30 tablet 1  . glipiZIDE (GLUCOTROL) 5  MG tablet Take 5 mg by mouth 2 (two) times daily.     . iron polysaccharides (NIFEREX) 150 MG capsule Take 1 capsule (150 mg total) by mouth daily.    [name] levothyroxine (SYNTHROID) 50 MCG tablet Take 1 tablet (50 mcg total) by mouth daily before breakfast. 30 tablet 0  . Omega-3 Fatty Acids (FISH OIL PO) Take 1 capsule by mouth daily.    . polyethylene glycol (MIRALAX) 17 g packet Take 17 g by mouth daily as needed for moderate constipation. 14 each 0  . potassium chloride (KLOR-CON) 10 MEQ tablet Take 1 tablet (10 mEq total) by mouth daily. 30 tablet 1  . SANTYL ointment Apply 1 application topically daily.     [name] senna-docusate (SENOKOT-S) 8.6-50 MG tablet Take 1 tablet by mouth 2 (two) times daily as needed for mild constipation.    . tamsulosin (FLOMAX) 0.4 MG CAPS capsule Take 1 capsule (0.4 mg total) by mouth 2 (two) times daily. 60 capsule 0  . traMADol  (ULTRAM) 50 MG tablet Take 1 tablet (50 mg total) by mouth every 6 (six) hours as needed for moderate pain. 30 tablet 0  . vitamin B-12 (CYANOCOBALAMIN) 1000 MCG tablet Take 1 tablet (1,000 mcg total) by mouth daily.    . vitamin C (ASCORBIC ACID) 500 MG tablet Take 500 mg by mouth daily.      Drug Regimen Review  Drug regimen was reviewed and remains appropriate with no significant issues identified    Home: Home Living Family/patient expects to be discharged to:: Inpatient rehab Living Arrangements: (patietn daughter and son alternate to care for patient in [place]) Available Help at Discharge: Family, Available 24 hours/day Type of Home: House Home Access: Ramped entrance Home Layout: One level Bathroom Shower/Tub: Multimedia programmer: Standard Bathroom Accessibility: Yes Home Equipment: Environmental consultant - 2 wheels, Wheelchair - manual, Bedside commode, Hospital bed Additional Comments: hopeful to return to CIR  Lives With: Alone   Functional History: Prior Function Level of Independence: Needs assistance Gait / Transfers Assistance Needed: used his R LE prosthesis twice to stand with STEDY in CIR ADL's / Homemaking Assistance Needed: unsure Communication / Swallowing Assistance Needed: HOH Comments: patietn wheelchair level since 02/2018  Functional Status:  Mobility: Bed Mobility Overal bed mobility: Needs Assistance Bed Mobility: Supine to Sit, Rolling Rolling: Mod assist, +2 for physical assistance Supine to sit: Mod assist, +2 for physical assistance Sit to supine: Min assist General bed mobility comments: use of rails to elevate trunk and pads to move hips/pelvis to proper position Transfers Overall transfer level: Needs assistance Equipment used: Rolling walker (2 wheeled) Transfer via Lift Equipment: Stedy Transfers: Sit to/from Stand Sit to Stand: Mod assist, +2 physical assistance, +2 safety/equipment, From elevated surface Anterior-Posterior transfers: Max  assist, +2 safety/equipment  Lateral/Scoot Transfers: +2 safety/equipment, +2 physical assistance, Max assist General transfer comment: pt unable to stand enough on first attempt to clear stedy seat; on second attempt able to place rt side of seat and then worked on rt wt-shift lean to get left side of seat under pt; able to stand 3/4 to remove flaps and sit in chair  ADL: ADL Overall ADL's : Needs assistance/impaired Eating/Feeding: Sitting, Set up Grooming: Wash/dry hands, Wash/dry face, Oral care, Sitting, Min guard Grooming Details (indicate cue type and reason): unsupported sitting in recliner Upper Body Bathing: Minimal assistance, Sitting Upper Body Bathing Details (indicate cue type and reason): simulated, unsupported in recliner. pt with Poor sitting balance unnsupported Lower Body Bathing:  Moderate assistance, Sitting/lateral leans Upper Body Dressing : Min guard, Sitting Lower Body Dressing: Maximal assistance Toilet Transfer: Moderate assistance, +2 for physical assistance Toilet Transfer Details (indicate cue type and reason): simulated to recliner with use of Stedy, mod A +2 sit - stnad Toileting- Clothing Manipulation and Hygiene: Total assistance, Sit to/from stand Functional mobility during ADLs: Moderate assistance, +2 for physical assistance, Cueing for safety General ADL Comments: pt participated in bathing and dressing seated in recliner  Cognition: Cognition Overall Cognitive Status: Within Functional Limits for tasks assessed Orientation Level: Oriented X4 Cognition Arousal/Alertness: Awake/alert Behavior During Therapy: WFL for tasks assessed/performed Overall Cognitive Status: Within Functional Limits for tasks assessed Area of Impairment: Problem solving Following Commands: Follows one step commands with increased time Safety/Judgement: Decreased awareness of deficits Problem Solving: Slow processing, Requires verbal cues, Requires tactile cues  Blood  pressure 138/68, pulse 62, temperature 97.7 F (36.5 C), resp. rate 20, weight 86.8 kg, SpO2 98 %.  Gen:in pain, frail, alert and oriented X 3 HEENT: oral mucosa pink and moist, NCAT Cardio: Reg rate Chest: normal effort, normal rate of breathing+ murmur Abd: soft, non-distended, +BS Ext: no edema Skin: Right BKA well-healed.  Left transmetatarsal amputation site dressed with wound VAC and stump shrinker  Appropriately tender. Resolving ecchymosis right groin.  Neuro: Patient resting comfortably.  Make good eye contact with examiner.  He is a bit confused but was able to provide his name and age with some delay in processing.  Able to follow one step commands Musculoskeletal: 4/5 in upper extremities bilaterally. Unable to participate in lower extremity testing.  Psych: pleasant, normal affect   Results for orders placed or performed during the hospital encounter of 12/25/18 (from the past 48 hour(s))  Glucose, capillary     Status: Abnormal   Collection Time: 12/25/18  5:57 PM  Result Value Ref Range   Glucose-Capillary 131 (H) 70 - 99 mg/dL   No results found.     Medical Problem List and Plan: 1.  Impaired mobility and ADLs secondary to dehiscence of left amputation stump  -patient may shower, but wound vac should be covered.  -ELOS/Goals: MinA in PT, OT, I in SLP 2.  Antithrombotics: -DVT/anticoagulation:  Pharmaceutical: Other (comment)--Apixaban  -antiplatelet therapy: ASA/Plavix 3. Pain Management: Tylenol prn has been effective--continue with  4. Mood: LCSW to follow for evaluation and support.   -antipsychotic agents: N/A 5. Neuropsych: This patient is capable of making decisions on hs own behalf. 6. Skin/Wound Care: Continue wound VAC for 7 total days. On Vitamin C and will add prostat to promote wound healing.  7. Fluids/Electrolytes/Nutrition: Monitor I/O. Check lytes in am.  8.  Diastolic CHF: Monitor for signs of overload--abdominal girth has decreased. Daily  weights. Continue Lasix and heart healthy diet.  9. BPH with urinary retention: On Proscar and Flomax. History of UTIs in past 3 monthsWill continue to monitor voiding with PVR checks.--unable to empty in bed. Attempt to toilet to [facility] to help with emptying.   10. PAF: Monitor HR tid--on Eliquis and atrovastatin 11. ABLA: Continue iron supplement.  12. Constipation: Had small BM 12/11--will augment bowel regimen--add Senna to colace.   13. T2DM: Will monitor BS ac/hs. On Glipizide 5 mg bid--use SSI prn. Appetite has been good per son.  14. HLD: Lipitor 40mg  QD  Reesa Chew, PA--C  I have personally performed a face to face diagnostic evaluation, including, but not limited to relevant history and physical exam findings, of this patient and developed relevant  assessment and plan.  Additionally, I have reviewed and concur with the physician assistant's documentation above.  The patient's status has not changed. The original post admission physician evaluation remains appropriate, and any changes from the pre-admission screening or documentation from the acute chart are noted above.   Leeroy Cha, MD

## 2018-12-25 NOTE — Discharge Summary (Signed)
Discharge Diagnoses:  Active Problems:   Gangrene of left foot ([place])   Dehiscence of amputation stump ([facility])   Surgeries: Procedure(s): LEFT BELOW KNEE AMPUTATION on 12/18/2018    Consultants:   Discharged Condition: Improved  Hospital Course: LOUI MASSENBURG is an 85 y.o. male who was admitted 12/18/2018 with a chief complaint of Left foot gangrene, with a final diagnosis of Gangrene Left Foot.  Patient was brought to the operating room on 12/18/2018 and underwent Procedure(s): LEFT BELOW KNEE AMPUTATION.    Patient was given perioperative antibiotics:  Anti-infectives (From admission, onward)   Start     Dose/Rate Route Frequency Ordered Stop   12/18/18 2000  ceFAZolin (ANCEF) IVPB 2g/100 mL premix     2 g 200 mL/hr over 30 Minutes Intravenous Every 6 hours 12/18/18 1606 12/20/18 0700   12/18/18 1200  ceFAZolin (ANCEF) IVPB 2g/100 mL premix     2 g 200 mL/hr over 30 Minutes Intravenous On call to O.R. 12/18/18 1152 12/18/18 1432   12/18/18 1155  ceFAZolin (ANCEF) 2-4 GM/100ML-% IVPB    Note to Pharmacy: [facility]   : cabinet override      12/18/18 1155 12/18/18 1402    .  Patient was given sequential compression devices, early ambulation, and aspirin for DVT prophylaxis.  Recent vital signs:  Patient Vitals for the past 24 hrs:  BP Temp Temp src Pulse Resp SpO2  12/25/18 0757 (!) 135/58 98.5 F (36.9 C) -- (!) 53 16 97 %  12/25/18 0500 (!) 147/57 98.3 F (36.8 C) Oral (!) 57 16 95 %  12/25/18 0251 (!) 147/57 98.3 F (36.8 C) Oral (!) 57 16 95 %  12/24/18 1942 140/61 97.9 F (36.6 C) Oral (!) 57 14 99 %  12/24/18 1357 (!) 149/64 97.6 F (36.4 C) Oral 61 17 98 %  .  Recent laboratory studies: No results found.  Discharge Medications:   Allergies as of 12/25/2018      Reactions   Iodinated Diagnostic Agents    Other reaction(s): Fever   Dye Fdc Red [red Dye] Hives   Iodine-131 Other (See Comments)   Breakout, fever   Iohexol     Code: RASH, Desc: PT  STATES HOT ALL OVER HAD TO HAVE ICE PACKS ALL OVER HIM AND DIFF BREATHING, PT REFUSED IV DYE      Medication List    STOP taking these medications   azithromycin 250 MG tablet Commonly known as: Zithromax   povidone-Iodine 5 % Soln topical solution Commonly known as: BETADINE     TAKE these medications   acetaminophen 325 MG tablet Commonly known as: TYLENOL Take 1-2 tablets (325-650 mg total) by mouth every 6 (six) hours as needed for mild pain (pain score 1-3 or temp > 100.5). What changed: Another medication with the same name was removed. Continue taking this medication, and follow the directions you see here.   apixaban 5 MG Tabs tablet Commonly known as: ELIQUIS Take 1 tablet (5 mg total) by mouth 2 (two) times daily.   aspirin EC 81 MG tablet Take 81 mg by mouth daily.   atorvastatin 40 MG tablet Commonly known as: LIPITOR Take 1 tablet (40 mg total) by mouth daily.   clopidogrel 75 MG tablet Commonly known as: PLAVIX Take 1 tablet (75 mg total) by mouth daily with breakfast.   dorzolamide-timolol 22.3-6.8 MG/ML ophthalmic solution Commonly known as: COSOPT Place 1 drop into both eyes 2 (two) times daily.   famotidine 20 MG tablet  Commonly known as: PEPCID Take 1 tablet (20 mg total) by mouth daily.   finasteride 5 MG tablet Commonly known as: PROSCAR Take 1 tablet (5 mg total) by mouth every evening.   FISH OIL PO Take 1 capsule by mouth daily.   furosemide 40 MG tablet Commonly known as: LASIX Take 1 tablet (40 mg total) by mouth daily.   glipiZIDE 5 MG tablet Commonly known as: GLUCOTROL Take 5 mg by mouth 2 (two) times daily.   iron polysaccharides 150 MG capsule Commonly known as: NIFEREX Take 1 capsule (150 mg total) by mouth daily.   levothyroxine 50 MCG tablet Commonly known as: SYNTHROID Take 1 tablet (50 mcg total) by mouth daily before breakfast.   polyethylene glycol 17 g packet Commonly known as: MiraLax Take 17 g by mouth daily  as needed for moderate constipation.   potassium chloride 10 MEQ tablet Commonly known as: KLOR-CON Take 1 tablet (10 mEq total) by mouth daily.   Santyl ointment Generic drug: collagenase Apply 1 application topically daily.   senna-docusate 8.6-50 MG tablet Commonly known as: Senokot-S Take 1 tablet by mouth 2 (two) times daily as needed for mild constipation.   tamsulosin 0.4 MG Caps capsule Commonly known as: FLOMAX Take 1 capsule (0.4 mg total) by mouth 2 (two) times daily.   traMADol 50 MG tablet Commonly known as: ULTRAM Take 1 tablet (50 mg total) by mouth every 6 (six) hours as needed for moderate pain.   vitamin B-12 1000 MCG tablet Commonly known as: CYANOCOBALAMIN Take 1 tablet (1,000 mcg total) by mouth daily.   vitamin C 500 MG tablet Commonly known as: ASCORBIC ACID Take 500 mg by mouth daily.       Diagnostic Studies: DG Chest 2 View  Result Date: 12/09/2018 CLINICAL DATA:  Follow-up from previous exam EXAM: CHEST - 2 VIEW COMPARISON:  12/08/2018 FINDINGS: Cardiac shadow is enlarged but stable. Aortic calcifications are again seen. Bibasilar effusions and atelectasis/infiltrate are again noted left slightly greater than right stable from recent exam. No acute bony abnormality is noted. IMPRESSION: Stable bibasilar effusions and atelectasis/infiltrate. Electronically Signed   By: Inez Catalina M.D.   On: 12/09/2018 19:25   DG CHEST PORT 1 VIEW  Result Date: 12/08/2018 CLINICAL DATA:  Leukocytosis. EXAM: PORTABLE CHEST 1 VIEW COMPARISON:  11/18/2018. FINDINGS: Stable cardiomegaly. Bibasilar pulmonary infiltrates/edema, progressed from prior exam. Bilateral moderate pleural effusions again noted. No pneumothorax. IMPRESSION: Stable cardiomegaly. Bibasilar pulmonary infiltrates/edema, progressed from prior exam. Progressive CHF and/or pneumonia could present this fashion. Bilateral moderate pleural effusions again noted without interim change. Electronically Signed    By: Marcello Moores  Register   On: 12/08/2018 09:46    Patient benefited maximally from their hospital stay and there were no complications.     Disposition: Discharge disposition: [facility]-Rehab Facility      Discharge Instructions    Call MD / Call 911   Complete by: As directed    If you experience chest pain or shortness of breath, CALL 911 and be transported to the hospital emergency room.  If you develope a fever above 101 F, pus (white drainage) or increased drainage or redness at the wound, or calf pain, call your surgeon's office.   Constipation Prevention   Complete by: As directed    Drink plenty of fluids.  Prune juice may be helpful.  You may use a stool softener, such as Colace (over the counter) 100 mg twice a day.  Use MiraLax (over the counter) for constipation  as needed.   Constipation Prevention   Complete by: As directed    Drink plenty of fluids.  Prune juice may be helpful.  You may use a stool softener, such as Colace (over the counter) 100 mg twice a day.  Use MiraLax (over the counter) for constipation as needed.   Diet - low sodium heart healthy   Complete by: As directed    Increase activity slowly as tolerated   Complete by: As directed    Increase activity slowly as tolerated   Complete by: As directed    Neg Press Wound Therapy / Incisional   Complete by: As directed    Show patient how to attach prevena pump. Will be removed in office at 1st post op     Follow-up Information    [name], MD In 1 week.   Specialty: Orthopedic Surgery Contact information: [address] [place] [address] [phone]            Signed: Bevely Palmer Genine Beckett 12/25/2018, 10:26 AM

## 2018-12-25 NOTE — Progress Notes (Signed)
S/P Left BKA. Comfortable Alert and awake. [place] in [place] .  Should hear on appeal for CIR today . If approved will transfer

## 2018-12-26 ENCOUNTER — Inpatient Hospital Stay (HOSPITAL_COMMUNITY): Payer: Medicare Other

## 2018-12-26 DIAGNOSIS — I739 Peripheral vascular disease, unspecified: Secondary | ICD-10-CM

## 2018-12-26 DIAGNOSIS — R339 Retention of urine, unspecified: Secondary | ICD-10-CM

## 2018-12-26 DIAGNOSIS — E119 Type 2 diabetes mellitus without complications: Secondary | ICD-10-CM

## 2018-12-26 DIAGNOSIS — Z89512 Acquired absence of left leg below knee: Secondary | ICD-10-CM

## 2018-12-26 LAB — CBC WITH DIFFERENTIAL/PLATELET
Abs Immature Granulocytes: 0.01 10*3/uL (ref 0.00–0.07)
Basophils Absolute: 0.1 10*3/uL (ref 0.0–0.1)
Basophils Relative: 1 %
Eosinophils Absolute: 0.4 10*3/uL (ref 0.0–0.5)
Eosinophils Relative: 5 %
HCT: 28.3 % — ABNORMAL LOW (ref 39.0–52.0)
Hemoglobin: 9.4 g/dL — ABNORMAL LOW (ref 13.0–17.0)
Immature Granulocytes: 0 %
Lymphocytes Relative: 36 %
Lymphs Abs: 3.1 10*3/uL (ref 0.7–4.0)
MCH: 30.2 pg (ref 26.0–34.0)
MCHC: 33.2 g/dL (ref 30.0–36.0)
MCV: 91 fL (ref 80.0–100.0)
Monocytes Absolute: 0.7 10*3/uL (ref 0.1–1.0)
Monocytes Relative: 8 %
Neutro Abs: 4.2 10*3/uL (ref 1.7–7.7)
Neutrophils Relative %: 50 %
Platelets: 379 10*3/uL (ref 150–400)
RBC: 3.11 MIL/uL — ABNORMAL LOW (ref 4.22–5.81)
RDW: 15.8 % — ABNORMAL HIGH (ref 11.5–15.5)
WBC: 8.4 10*3/uL (ref 4.0–10.5)
nRBC: 0 % (ref 0.0–0.2)

## 2018-12-26 LAB — COMPREHENSIVE METABOLIC PANEL
ALT: 25 U/L (ref 0–44)
AST: 34 U/L (ref 15–41)
Albumin: 2.8 g/dL — ABNORMAL LOW (ref 3.5–5.0)
Alkaline Phosphatase: 49 U/L (ref 38–126)
Anion gap: 9 (ref 5–15)
BUN: 24 mg/dL — ABNORMAL HIGH (ref 8–23)
CO2: 25 mmol/L (ref 22–32)
Calcium: 8.9 mg/dL (ref 8.9–10.3)
Chloride: 102 mmol/L (ref 98–111)
Creatinine, Ser: 1.21 mg/dL (ref 0.61–1.24)
GFR calc Af Amer: >60 mL/min (ref >60)
GFR calc non Af Amer: 53 mL/min — ABNORMAL LOW (ref >60)
Glucose, Bld: 90 mg/dL (ref 70–99)
Potassium: 4 mmol/L (ref 3.5–5.1)
Sodium: 136 mmol/L (ref 135–145)
Total Bilirubin: 1 mg/dL (ref 0.3–1.2)
Total Protein: 6.6 g/dL (ref 6.5–8.1)

## 2018-12-26 LAB — URINALYSIS, COMPLETE (UACMP) WITH MICROSCOPIC
Bacteria, UA: NONE SEEN
Bilirubin Urine: NEGATIVE
Glucose, UA: NEGATIVE mg/dL
Hgb urine dipstick: NEGATIVE
Ketones, ur: NEGATIVE mg/dL
Leukocytes,Ua: NEGATIVE
Nitrite: NEGATIVE
Protein, ur: NEGATIVE mg/dL
Specific Gravity, Urine: 1.012 (ref 1.005–1.030)
pH: 6 (ref 5.0–8.0)

## 2018-12-26 LAB — GLUCOSE, CAPILLARY
Glucose-Capillary: 80 mg/dL (ref 70–99)
Glucose-Capillary: 149 mg/dL — ABNORMAL HIGH (ref 70–99)
Glucose-Capillary: 95 mg/dL (ref 70–99)
Glucose-Capillary: 178 mg/dL — ABNORMAL HIGH (ref 70–99)

## 2018-12-26 MED ORDER — OXYCODONE HCL 5 MG PO TABS
5.0000 mg | ORAL_TABLET | Freq: Four times a day (QID) | ORAL | Status: DC | PRN
  Administered 2018-12-26: 5 mg via ORAL
  Filled 2018-12-26: qty 1

## 2018-12-26 MED ORDER — OXYCODONE HCL 5 MG PO TABS
2.5000 mg | ORAL_TABLET | Freq: Four times a day (QID) | ORAL | Status: DC | PRN
  Administered 2018-12-26: 2.5 mg via ORAL
  Filled 2018-12-26: qty 1

## 2018-12-26 MED ORDER — ACETAMINOPHEN 500 MG PO TABS
500.0000 mg | ORAL_TABLET | Freq: Three times a day (TID) | ORAL | Status: DC
  Administered 2018-12-26 – 2019-01-05 (×27): 500 mg via ORAL
  Filled 2018-12-26 (×29): qty 1

## 2018-12-26 MED ORDER — METHOCARBAMOL 500 MG PO TABS: 500.0000 mg | ORAL_TABLET | Freq: Four times a day (QID) | ORAL | Status: DC | PRN

## 2018-12-26 NOTE — Progress Notes (Signed)
[facility] PHYSICAL MEDICINE & REHABILITATION PROGRESS NOTE   Subjective/Complaints: Had a fair night. Pain in left leg, retaining urine (does somewhat at home too)  ROS: Patient denies fever, rash, sore throat, blurred vision, nausea, vomiting, diarrhea, cough, shortness of breath or chest pain, joint or back pain, headache, or mood change.    Objective:   No results found. Recent Labs    12/26/18 0539  WBC 8.4  HGB 9.4*  HCT 28.3*  PLT 379   Recent Labs    12/26/18 0539  NA 136  K 4.0  CL 102  CO2 25  GLUCOSE 90  BUN 24*  CREATININE 1.21  CALCIUM 8.9    Intake/Output Summary (Last 24 hours) at 12/26/2018 0943 Last data filed at 12/26/2018 9924 Gross per 24 hour  Intake 357 ml  Output 1600 ml  Net -1243 ml     Physical Exam: Vital Signs Blood pressure (!) 142/66, pulse (!) 59, temperature 98.2 F (36.8 C), resp. rate 18, weight 86.3 kg, SpO2 98 %. Constitutional: No distress . Vital signs reviewed. HEENT: EOMI, oral membranes moist Neck: supple Cardiovascular: RRR with Sys murmur. No JVD    Respiratory: CTA Bilaterally without wheezes or rales. Normal effort    GI: BS +, non-tender, non-distended  Ext: no edema Skin: Right BKA well-healed . Left BKA site dressed with wound VAC and stump shrinker Appropriately tender. Groin bruise  Neuro: alert and appropriate. Normal cn exam Musculoskeletal: 4/5 in upper extremities bilaterally. RLE 4/5. LLE limited by pain  Psych: pleasant, normal affect    Assessment/Plan: 1. Functional deficits secondary to left BKA which require 3+ hours per day of interdisciplinary therapy in a comprehensive inpatient rehab setting.  Physiatrist is providing close team supervision and 24 hour management of active medical problems listed below.  Physiatrist and rehab team continue to assess barriers to discharge/monitor patient progress toward functional and medical goals  Care Tool:  Bathing              Bathing  assist       Upper Body Dressing/Undressing Upper body dressing        Upper body assist      Lower Body Dressing/Undressing Lower body dressing            Lower body assist       Toileting Toileting    Toileting assist Assist for toileting: Total Assistance - Patient < 25%     Transfers Chair/bed transfer  Transfers assist           Locomotion Ambulation   Ambulation assist              Walk 10 feet activity   Assist           Walk 50 feet activity   Assist           Walk 150 feet activity   Assist           Walk 10 feet on uneven surface  activity   Assist           Wheelchair     Assist               Wheelchair 50 feet with 2 turns activity    Assist            Wheelchair 150 feet activity     Assist          Blood pressure (!) 142/66, pulse (!) 59, temperature 98.2 F (36.8 C),  resp. rate 18, weight 86.3 kg, SpO2 98 %.  Medical Problem List and Plan: 1.  Impaired mobility and ADLs secondary to dehiscence of left amputation stump, left BKA             -patient may shower, but wound vac should be covered.             -ELOS/Goals: MinA in PT, OT, I in SLP  Patient is beginning CIR therapies today including PT and OT  2.  Antithrombotics: -DVT/anticoagulation:  Pharmaceutical: Other (comment)--Apixaban             -antiplatelet therapy: ASA/Plavix 3. Pain Management: Tylenol prn has been effective--continue with  4. Mood: LCSW to follow for evaluation and support.              -antipsychotic agents: N/A 5. Neuropsych: This patient is capable of making decisions on hs own behalf. 6. Skin/Wound Care: Continue wound VAC for 7 total days. On Vitamin C and will add prostat to promote wound healing.  7. Fluids/Electrolytes/Nutrition:  Encourage PO  -I personally reviewed the patient's labs today.    -BUN sl elevate push fluids 8.  Diastolic CHF: Monitor for signs of overload--abdominal  girth has decreased.   Continue Lasix and heart healthy diet.   -daily weights Filed Weights   12/25/18 1648 12/26/18 0532  Weight: 86.8 kg 86.3 kg    9. BPH with urinary retention: On Proscar and Flomax. History of UTIs in past 3 months   -pvr's 300-400cc  -Attempt to toilet to [facility] to help with emptying. Double voids, extra time   -no urecholine d/t cv hx,   -check ua  10. PAF: Monitor HR tid--on Eliquis and atrovastatin 11. ABLA: Continue iron supplement.  12. Constipation: Had small BM 12/11added Senna to colace.    -bm this am 12/12 13. T2DM: Will monitor BS ac/hs. On Glipizide 5 mg bid--use SSI prn.     -fair control so far. Monitor for pattern 14. HLD: Lipitor 40mg  QD    LOS: 1 days A FACE TO FACE EVALUATION WAS PERFORMED  Meredith Staggers 12/26/2018, 9:43 AM

## 2018-12-26 NOTE — Progress Notes (Signed)
Inpatient Rehabilitation  Patient information reviewed and entered into eRehab system by Alexandria Shiflett M. Kaylynn Chamblin, M.A., CCC/SLP, PPS Coordinator.  Information including medical coding, functional ability and quality indicators will be reviewed and updated through discharge.    

## 2018-12-26 NOTE — Progress Notes (Signed)
Patient c/o pain on his left leg unrelieved by ordered pain med. Placed up on pillow; placed iced pack; encouraged patient to rub leg claims none  of them works. MD notified.

## 2018-12-26 NOTE — Evaluation (Signed)
Occupational Therapy Assessment and Plan  Patient Details  Name: [name] MRN: [id number] Date of Birth: 06/13/1927  OT Diagnosis: abnormal posture, acute pain, cognitive deficits and muscle weakness (generalized) Rehab Potential:   ELOS: 14-16   Today's Date: 12/26/2018 OT Individual Time: 1300-1400 OT Individual Time Calculation (min): 60 min     Problem List:  Patient Active Problem List   Diagnosis Date Noted  . Unilateral complete BKA, left, initial encounter ([place]) 12/25/2018  . Dehiscence of amputation stump ([place])   . History of transmetatarsal amputation of left foot ([place]) 12/09/2018  . Critical limb ischemia with history of revascularization of same extremity   . Gangrene of left foot ([place])   . Diabetic ulcer of toe of left foot associated with type 2 diabetes mellitus ([place])   . Cellulitis of left lower extremity   . Toe infection 11/18/2018  . Peripheral edema   . Acute on chronic diastolic CHF (congestive heart failure) ([place]) 11/09/2018  . S/P BKA (below knee amputation) unilateral, right ([place]) 10/05/2018  . Hyponatremia   . Essential hypertension   . Postoperative pain   . Pain of left heel   . Unable to maintain weight-bearing   . Type 2 diabetes mellitus with diabetic peripheral angiopathy with gangrene ([place])   . Anemia due to acute blood loss   . Stage 3 chronic kidney disease   . Acquired absence of right leg below knee ([place]) 09/07/2018  . Gangrene of right foot ([place])   . DNR (do not resuscitate) discussion   . Goals of care, counseling/discussion   . Palliative care by specialist   . Type 2 diabetes mellitus with foot ulcer and gangrene ([place])   . Severe protein-calorie malnutrition ([place])   . Ischemic foot 08/20/2018  . Critical lower limb ischemia 08/03/2018  . Type 2 diabetes mellitus with foot ulcer, without long-term current use of insulin ([place])   . Gastroesophageal reflux disease   . Cellulitis of great toe of right foot 05/29/2018  . Atrial  fibrillation, chronic   . Chest pain 07/25/2017  . PAF (paroxysmal atrial fibrillation) ([place]) 07/25/2017  . BPH (benign prostatic hyperplasia) 07/25/2017  . Shortness of breath 05/11/2017  . Hypothyroidism 05/11/2017  . Chronic diastolic CHF (congestive heart failure) ([place]) 05/11/2017  . Moderate aortic stenosis 06/12/2016  . RBBB 06/12/2016  . Peripheral vascular disease ([place]) 08/04/2012  . Essential hypertension, benign 08/04/2012  . Hyperlipidemia 08/04/2012  . Dizziness 08/04/2012    Past Medical History:  Past Medical History:  Diagnosis Date  . Arthritis   . Borderline diabetic   . Diabetes mellitus without complication ([place])   . Glaucoma   . Hyperlipidemia   . Hypertension   . PVD (peripheral vascular disease) ([place])   . Sleep apnea    Cpap ordered but doesnt use   Past Surgical History:  Past Surgical History:  Procedure Laterality Date  . ABDOMINAL AORTOGRAM W/LOWER EXTREMITY N/A 07/27/2018   Procedure: ABDOMINAL AORTOGRAM W/LOWER EXTREMITY;  Surgeon: Lorretta Harp, MD;  Location: [facility];  Service: Cardiovascular;  Laterality: N/A;  . AMPUTATION Right 08/28/2018   Procedure: RIGHT BELOW KNEE AMPUTATION;  Surgeon: [name], MD;  Location: [facility];  Service: Orthopedics;  Laterality: Right;  . AMPUTATION Left 12/02/2018   Procedure: LEFT TRANSMETATARSAL AMPUTATION AND NEGATIVE PRESSURE WOUND VAC PLACEMENT;  Surgeon: [name], MD;  Location: [facility];  Service: Orthopedics;  Laterality: Left;  . AMPUTATION Left 12/18/2018   Procedure: LEFT BELOW  KNEE AMPUTATION;  Surgeon: [name], MD;  Location: [facility];  Service: Orthopedics;  Laterality: Left;  . APPENDECTOMY  1943  . BELOW KNEE LEG AMPUTATION Left 12/18/2018  . CATARACT EXTRACTION  2012   x2  . COLONOSCOPY N/A 11/01/2014   Procedure: COLONOSCOPY;  Surgeon: Aviva Signs, MD;  [facility];  Service: Gastroenterology;  Laterality: N/A;  . ESOPHAGOGASTRODUODENOSCOPY N/A 11/01/2014    Procedure: ESOPHAGOGASTRODUODENOSCOPY (EGD);  Surgeon: Aviva Signs, MD;  [facility];  Service: Gastroenterology;  Laterality: N/A;  . HERNIA REPAIR Left   . INGUINAL HERNIA REPAIR Right 03/01/2013   Procedure: RIGHT INGUINAL HERNIORRHAPHY;  Surgeon: Jamesetta So, MD;  Location: [facility];  Service: General;  Laterality: Right;  . INSERTION OF MESH Right 03/01/2013   Procedure: INSERTION OF MESH;  Surgeon: Jamesetta So, MD;  [facility];  Service: General;  Laterality: Right;  . LOWER EXTREMITY ANGIOGRAPHY Right 08/25/2018   Procedure: LOWER EXTREMITY ANGIOGRAPHY;  Surgeon: Wellington Hampshire, MD;  Location: [facility];  Service: Cardiovascular;  Laterality: Right;  . LOWER EXTREMITY ANGIOGRAPHY N/A 11/25/2018   Procedure: LOWER EXTREMITY ANGIOGRAPHY;  Surgeon: Wellington Hampshire, MD;  Location: [facility];  Service: Cardiovascular;  Laterality: N/A;  . NM MYOCAR PERF WALL MOTION  06/05/2010   no significant ischemia  . PERIPHERAL VASCULAR ATHERECTOMY Right 08/03/2018   Procedure: PERIPHERAL VASCULAR ATHERECTOMY;  Surgeon: Lorretta Harp, MD;  Location: [facility];  Service: Cardiovascular;  Laterality: Right;  . PERIPHERAL VASCULAR ATHERECTOMY Left 11/23/2018   Procedure: PERIPHERAL VASCULAR ATHERECTOMY;  Surgeon: Lorretta Harp, MD;  Location: [facility];  Service: Cardiovascular;  Laterality: Left;  sfa with dc balloon  . PERIPHERAL VASCULAR BALLOON ANGIOPLASTY Left 11/25/2018   Procedure: PERIPHERAL VASCULAR BALLOON ANGIOPLASTY;  Surgeon: Wellington Hampshire, MD;  Location: [facility];  Service: Cardiovascular;  Laterality: Left;  popliteal  . PERIPHERAL VASCULAR INTERVENTION Left 11/25/2018   Procedure: PERIPHERAL VASCULAR INTERVENTION;  Surgeon: Wellington Hampshire, MD;  Location: [facility];  Service: Cardiovascular;  Laterality: Left;  tibial peroneal trunk and peroneal stents   . stent  06/14/2010   left leg  . US  ECHOCARDIOGRAPHY  01/21/2006   moderate mitral annular ca+, mild MR, AOV moderately sclerotic.    Assessment & Plan Clinical Impression: [name] is an 85 year old male with history of T2DM, PAF, recent admissions for PVD s/p R-BKA as well as acute on chronic diastolic CHF who was originally admitted on 11/23/18 with osteomyelitis right foot and underwent revascularization of distal L-SFA and popliteal artery with left transmetatarsal amputation 12/02/18 in attempts of limb salvage. He was admitted on CIR for rehab 11/25 and has had progressive ischemic changes of wound. Hospital course also significant for intermittent urinary retention, fluid overload, fluctuating WBC and pain.  Dr. Sharol Given consulted and felt that limb not salvageable therefore patient taken to OR 12/18/18 for L-BKA. Post op  ABLA being monitored and has had asymptomatic bradycardia. Therapy ongoing and CIR recommended for resumption of therapy Patient currently requires total with basic self-care skills secondary to muscle weakness, decreased cardiorespiratoy endurance, decreased coordination and decreased motor planning and decreased sitting balance, decreased standing balance, decreased postural control and decreased balance strategies.  Prior to hospitalization, patient could complete ADL with max.  Patient will benefit from skilled intervention to decrease level of assist with basic self-care skills prior to discharge home with care  partner.  Anticipate patient will require Mod A toilet transfers, S bed mobility during bathing and MAX A toileting and follow up home health.  OT - End of Session Activity Tolerance: Tolerates 10 - 20 min activity with multiple rests Endurance Deficit: Yes Endurance Deficit Description: lethargic likely d/t medications OT Assessment Rehab Potential (ACUTE ONLY): Good OT Patient demonstrates impairments in the following area(s): Balance;Endurance;Motor;Pain;Safety;Skin Integrity OT Basic ADL's  Functional Problem(s): Bathing;Dressing;Toileting OT Transfers Functional Problem(s): Toilet OT Plan OT Intensity: Minimum of 1-2 x/day, 45 to 90 minutes OT Frequency: 5 out of 7 days OT Duration/Estimated Length of Stay: 14-16 OT Treatment/Interventions: Balance/vestibular training;Discharge planning;Pain management;Self Care/advanced ADL retraining;Therapeutic Activities;UE/LE Coordination activities;Functional mobility training;Patient/family education;Skin care/wound managment;Therapeutic Exercise;DME/adaptive equipment instruction;Psychosocial support;Splinting/orthotics;UE/LE Strength taining/ROM;Wheelchair propulsion/positioning OT Bathroom Transfers Anticipated Outcome(s): mod A toilet OT Recommendation Follow Up Recommendations: Home health OT Equipment Details: hoyer lift?   Skilled Therapeutic Intervention 1:1. Pt and daughter present for session educated on OT role/purpose, POC, ELOS and participated in discussion on PLOF and prioritized goal setting to decrease BOC. Pt completes bathing seated with S for UB and MAX A for LB sit to stand in stedy with +2 A for cleansing buttocks and power up into standing with prosthetic. stedy transfer back to bed and pt scoots laterally head<>foot of bed with total fading to MAX A with manual faciltiation of weight shift forward and knee flexion in RLE. Exited session after CGA rolling B to place new chuck pad. Exited session with pt seated in bed, exit alarm on and call light in reach  OT Evaluation Precautions/Restrictions  Precautions Precautions: Fall Precaution Comments: prior R transtibial amputation; wound VAC to L residual limb Required Braces or Orthoses: Other Brace Other Brace: Limbguard LLE Restrictions Weight Bearing Restrictions: Yes Other Position/Activity Restrictions: R BK amp is healed; Prosthesis is new; he is on a wearing/breaking in schedule now General Chart Reviewed: Yes Vital Signs  Pain Pain Assessment Pain  Scale: 0-10 Pain Score: 10-Worst pain ever Pain Type: Acute pain;Surgical pain Pain Location: Leg Pain Orientation: Left Pain Descriptors / Indicators: Aching;Grimacing;Moaning Pain Frequency: Constant Pain Onset: On-going Pain Intervention(s): Medication (See eMAR) Home Living/Prior Functioning Home Living Available Help at Discharge: Family, Available 24 hours/day Type of Home: House Home Access: Ramped entrance Home Layout: One level Bathroom Shower/Tub: Multimedia programmer: Standard Bathroom Accessibility: Yes  Lives With: Family Prior Function Level of Independence: Needs assistance with ADLs, Needs assistance with tranfers  Able to Take Stairs?: No Driving: No Comments: patietn wheelchair level since 02/2018, reports using a stedy lift at home ADL ADL Upper Body Bathing: Supervision/safety Where Assessed-Upper Body Bathing: Sitting at sink Lower Body Bathing: Dependent(2 helpers) Where Assessed-Lower Body Bathing: Sitting at sink, Standing at sink Upper Body Dressing: Minimal assistance Where Assessed-Upper Body Dressing: Sitting at sink Lower Body Dressing: Dependent Where Assessed-Lower Body Dressing: Sitting at sink, Standing at sink Vision Baseline Vision/History: Wears glasses Wears Glasses: At all times Patient Visual Report: No change from baseline Vision Assessment?: No apparent visual deficits Perception  Perception: Within Functional Limits Praxis Praxis: Intact Cognition Overall Cognitive Status: Within Functional Limits for tasks assessed Arousal/Alertness: Awake/alert Orientation Level: Person;Place;Situation Person: Oriented Place: Oriented Situation: Oriented Year: 2020 Month: December Day of Week: (wouldnt guess) Memory: Impaired Memory Impairment: Decreased recall of new information Immediate Memory Recall: Sock;Blue;Bed Memory Recall Sock: With Cue Memory Recall Blue: Without Cue Memory Recall Bed: Without Cue Attention:  Sustained Sustained Attention: Appears intact Awareness: Impaired Awareness Impairment: Intellectual impairment Safety/Judgment: Appears intact  Sensation Sensation Light Touch: Impaired Detail Light Touch Impaired Details: Impaired RLE;Impaired LLE Motor  Motor Motor: Abnormal postural alignment and control Motor - Skilled Clinical Observations: generalized weakness Mobility  Bed Mobility Bed Mobility: Rolling Right;Rolling Left Rolling Right: Contact Guard/Touching assist Rolling Left: Contact Guard/Touching assist  Trunk/Postural Assessment  Cervical Assessment Cervical Assessment: Exceptions to WFL(forward head) Thoracic Assessment Thoracic Assessment: Exceptions to WFL(rounded shoulders) Lumbar Assessment Lumbar Assessment: Exceptions to WFL(post pelvic tilt) Postural Control Postural Control: Deficits on evaluation Trunk Control: posterior lean Righting Reactions: delayed and requires A to return to midline  Balance Balance Balance Assessed: Yes Static Sitting Balance Static Sitting - Level of Assistance: 5: Stand by assistance;4: Min assist Dynamic Sitting Balance Dynamic Sitting - Level of Assistance: 3: Mod assist Sitting balance - Comments: initial posterior lean in sitting requiring assist to correct, mod assist for dynamic sitting balance during lateral scoot transfer Extremity/Trunk Assessment RUE Assessment General Strength Comments: 3/5 LUE Assessment General Strength Comments: 3/5     Refer to Care Plan for Long Term Goals  Recommendations for other services: None    Discharge Criteria: Patient will be discharged from OT if patient refuses treatment 3 consecutive times without medical reason, if treatment goals not met, if there is a change in medical status, if patient makes no progress towards goals or if patient is discharged from hospital.  The above assessment, treatment plan, treatment alternatives and goals were discussed and mutually agreed  upon: by patient  Tonny Branch 12/26/2018, 2:06 PM

## 2018-12-26 NOTE — Evaluation (Signed)
Physical Therapy Assessment and Plan  Patient Details  Name: [name] MRN: [id number] Date of Birth: June 22, 1927  PT Diagnosis: Abnormal posture, Difficulty walking, Impaired sensation and Muscle weakness Rehab Potential: Fair ELOS: 10-14 days   Today's Date: 12/26/2018 PT Individual Time: 2671-2458 and 0998-3382 PT Individual Time Calculation (min): 55 min and 60 min     Problem List:  Patient Active Problem List   Diagnosis Date Noted  . Unilateral complete BKA, left, initial encounter ([place]) 12/25/2018  . Dehiscence of amputation stump ([place])   . History of transmetatarsal amputation of left foot ([place]) 12/09/2018  . Critical limb ischemia with history of revascularization of same extremity   . Gangrene of left foot ([place])   . Diabetic ulcer of toe of left foot associated with type 2 diabetes mellitus ([place])   . Cellulitis of left lower extremity   . Toe infection 11/18/2018  . Peripheral edema   . Acute on chronic diastolic CHF (congestive heart failure) ([place]) 11/09/2018  . S/P BKA (below knee amputation) unilateral, right ([place]) 10/05/2018  . Hyponatremia   . Essential hypertension   . Postoperative pain   . Pain of left heel   . Unable to maintain weight-bearing   . Type 2 diabetes mellitus with diabetic peripheral angiopathy with gangrene ([place])   . Anemia due to acute blood loss   . Stage 3 chronic kidney disease   . Acquired absence of right leg below knee ([place]) 09/07/2018  . Gangrene of right foot ([place])   . DNR (do not resuscitate) discussion   . Goals of care, counseling/discussion   . Palliative care by specialist   . Type 2 diabetes mellitus with foot ulcer and gangrene ([place])   . Severe protein-calorie malnutrition ([place])   . Ischemic foot 08/20/2018  . Critical lower limb ischemia 08/03/2018  . Type 2 diabetes mellitus with foot ulcer, without long-term current use of insulin ([place])   . Gastroesophageal reflux disease   . Cellulitis of great toe of right foot  05/29/2018  . Atrial fibrillation, chronic   . Chest pain 07/25/2017  . PAF (paroxysmal atrial fibrillation) ([place]) 07/25/2017  . BPH (benign prostatic hyperplasia) 07/25/2017  . Shortness of breath 05/11/2017  . Hypothyroidism 05/11/2017  . Chronic diastolic CHF (congestive heart failure) ([place]) 05/11/2017  . Moderate aortic stenosis 06/12/2016  . RBBB 06/12/2016  . Peripheral vascular disease ([place]) 08/04/2012  . Essential hypertension, benign 08/04/2012  . Hyperlipidemia 08/04/2012  . Dizziness 08/04/2012    Past Medical History:  Past Medical History:  Diagnosis Date  . Arthritis   . Borderline diabetic   . Diabetes mellitus without complication ([employer])   . Glaucoma   . Hyperlipidemia   . Hypertension   . PVD (peripheral vascular disease) ([place])   . Sleep apnea    Cpap ordered but doesnt use   Past Surgical History:  Past Surgical History:  Procedure Laterality Date  . ABDOMINAL AORTOGRAM W/LOWER EXTREMITY N/A 07/27/2018   Procedure: ABDOMINAL AORTOGRAM W/LOWER EXTREMITY;  Surgeon: Lorretta Harp, MD;  Location: [facility];  Service: Cardiovascular;  Laterality: N/A;  . AMPUTATION Right 08/28/2018   Procedure: RIGHT BELOW KNEE AMPUTATION;  Surgeon: [name], MD;  Location: [facility];  Service: Orthopedics;  Laterality: Right;  . AMPUTATION Left 12/02/2018   Procedure: LEFT TRANSMETATARSAL AMPUTATION AND NEGATIVE PRESSURE WOUND VAC PLACEMENT;  Surgeon: [name], MD;  Location: [facility];  Service: Orthopedics;  Laterality: Left;  . AMPUTATION Left 12/18/2018  Procedure: LEFT BELOW KNEE AMPUTATION;  Surgeon: [name], MD;  Location: [facility];  Service: Orthopedics;  Laterality: Left;  . APPENDECTOMY  1943  . BELOW KNEE LEG AMPUTATION Left 12/18/2018  . CATARACT EXTRACTION  2012   x2  . COLONOSCOPY N/A 11/01/2014   Procedure: COLONOSCOPY;  Surgeon: Aviva Signs, MD;  [facility];  Service: Gastroenterology;  Laterality: N/A;  .  ESOPHAGOGASTRODUODENOSCOPY N/A 11/01/2014   Procedure: ESOPHAGOGASTRODUODENOSCOPY (EGD);  Surgeon: Aviva Signs, MD;  [facility];  Service: Gastroenterology;  Laterality: N/A;  . HERNIA REPAIR Left   . INGUINAL HERNIA REPAIR Right 03/01/2013   Procedure: RIGHT INGUINAL HERNIORRHAPHY;  Surgeon: Jamesetta So, MD;  Location: [facility];  Service: General;  Laterality: Right;  . INSERTION OF MESH Right 03/01/2013   Procedure: INSERTION OF MESH;  Surgeon: Jamesetta So, MD;  Location[facility];  Service: General;  Laterality: Right;  . LOWER EXTREMITY ANGIOGRAPHY Right 08/25/2018   Procedure: LOWER EXTREMITY ANGIOGRAPHY;  Surgeon: Wellington Hampshire, MD;  Location: [facility];  Service: Cardiovascular;  Laterality: Right;  . LOWER EXTREMITY ANGIOGRAPHY N/A 11/25/2018   Procedure: LOWER EXTREMITY ANGIOGRAPHY;  Surgeon: Wellington Hampshire, MD;  Location: [facility];  Service: Cardiovascular;  Laterality: N/A;  . NM MYOCAR PERF WALL MOTION  06/05/2010   no significant ischemia  . PERIPHERAL VASCULAR ATHERECTOMY Right 08/03/2018   Procedure: PERIPHERAL VASCULAR ATHERECTOMY;  Surgeon: Lorretta Harp, MD;  Location: [facility];  Service: Cardiovascular;  Laterality: Right;  . PERIPHERAL VASCULAR ATHERECTOMY Left 11/23/2018   Procedure: PERIPHERAL VASCULAR ATHERECTOMY;  Surgeon: Lorretta Harp, MD;  Location: [facility];  Service: Cardiovascular;  Laterality: Left;  sfa with dc balloon  . PERIPHERAL VASCULAR BALLOON ANGIOPLASTY Left 11/25/2018   Procedure: PERIPHERAL VASCULAR BALLOON ANGIOPLASTY;  Surgeon: Wellington Hampshire, MD;  Location: [facility];  Service: Cardiovascular;  Laterality: Left;  popliteal  . PERIPHERAL VASCULAR INTERVENTION Left 11/25/2018   Procedure: PERIPHERAL VASCULAR INTERVENTION;  Surgeon: Wellington Hampshire, MD;  Location: [facility];  Service: Cardiovascular;  Laterality: Left;  tibial peroneal trunk and peroneal stents   .  stent  06/14/2010   left leg  . US ECHOCARDIOGRAPHY  01/21/2006   moderate mitral annular ca+, mild MR, AOV moderately sclerotic.    Assessment & Plan Clinical Impression: Patient is a 84 y.o. year old male with history of T2DM, PAF, recent admissions for PVD s/p R-BKA as well as acute on chronic diastolic CHF who was originally admitted on 11/23/18 with osteomyelitis right foot and underwent revascularization of distal L-SFA and popliteal artery with left transmetatarsal amputation 12/02/18 in attempts of limb salvage. He was admitted on CIR for rehab 11/25 and has had progressive ischemic changes of wound. Hospital course also significant for intermittent urinary retention, fluid overload, fluctuating WBC and pain.  Dr. Sharol Given consulted and felt that limb not salvageable therefore patient taken to OR 12/18/18 for L-BKA. Post op  ABLA being monitored and has had asymptomatic bradycardia. Therapy ongoing and CIR recommended for resumption of therapy.   Patient transferred to CIR on 12/25/2018 .   Patient currently requires total with mobility secondary to muscle weakness and muscle joint tightness, decreased cardiorespiratoy endurance, decreased awareness and decreased sitting balance, decreased standing balance, decreased balance strategies and difficulty maintaining precautions.  Prior to hospitalization, patient was mod with mobility and lived with Family(reports his son had been living with him) in  a House home.  Home access is  Ramped entrance.  Patient will benefit from skilled PT intervention to maximize safe functional mobility, minimize fall risk and decrease caregiver burden for planned discharge home with 24 hour assist.  Anticipate patient will benefit from follow up [facility] at discharge.  PT - End of Session Activity Tolerance: Tolerates 30+ min activity with multiple rests Endurance Deficit: Yes PT Assessment Rehab Potential (ACUTE/IP ONLY): Fair PT Patient demonstrates impairments in the  following area(s): Balance;Behavior;Edema;Motor;Pain;Safety;Sensory;Skin Integrity PT Transfers Functional Problem(s): Bed Mobility;Bed to Chair;Car;Furniture PT Locomotion Functional Problem(s): Ambulation;Wheelchair Mobility;Stairs PT Plan PT Intensity: Minimum of 1-2 x/day ,45 to 90 minutes PT Frequency: 5 out of 7 days PT Duration Estimated Length of Stay: 10-14 days PT Treatment/Interventions: Balance/vestibular training;Cognitive remediation/compensation;Discharge planning;Disease management/prevention;DME/adaptive equipment instruction;Functional mobility training;Neuromuscular re-education;Pain management;Patient/family education;Psychosocial support;Skin care/wound management;Splinting/orthotics;Therapeutic Activities;Therapeutic Exercise;UE/LE Strength taining/ROM;UE/LE Coordination activities;Wheelchair propulsion/positioning PT Transfers Anticipated Outcome(s): mod assist PT Locomotion Anticipated Outcome(s): supervision w/c mobility PT Recommendation Follow Up Recommendations: Home health PT;24 hour supervision/assistance Patient destination: Home Equipment Recommended: To be determined Equipment Details: pt has w/c, slideboard, and a power assisted standing lift  Skilled Therapeutic Intervention Session 1: Evaluation completed (see details above and below) with education on PT POC and goals and individual treatment initiated with focus on functional mobility, transfers, limb loss education, w/c propulsion and safety. Pt supine in bed upon PT arrival, agreeable to therapy tx and reports pain 5/10 in L residual lower limb. Pt transferred to sitting EOB with max assist, cues for techniques and hand placement. Upon sitting pt with posterior lean requiring assist for sitting balance, min assist. Pt able to maintain static sitting balance while therapist donned R prosthesis, total assist. Pt reports having to use the bathroom. Pt performed lateral scoot to bedside commode with max assist,  cues for techniques, hand placement and cues to try pushing through R LE/prosthesis (pt with limit pushing through R LE secondary to limited knee flexion). Pt continent of bowel this session. Pt performed sit<>Stands x 3 from commode pulling up on stedy, unable to full extend hips/knees to stand all the way requiring max assist for standing balance while second helper performed pericare total assist. Pt able to stand up tall enough to get stedy seat in place, total +2 assist. Transferred to w/c dependent with stedy. Pt worked on w/c propulsion, propelled w/c x 150 ft with supervision using B UE s, left seated in w/c at end of session with needs in reach and chair alarm in place.   Session 2: Pt supine in bed asleep upon PT arrival, agreeable to therapy tx and reports pain 6/10 in L residual limb. Reports that earlier this morning his L lower limb was really hurting. Pt's daughter present in the room, reviewed home set up info and equipment. Daughter reports they take turns helping him, he uses a stander lift at home. Therapist provided education to pt's daughter regarding pt's limited L knee extension ROM and emphasized importance for stretching and positioning in extended position. Pt transferred to sitting EOB with max assist, posterior lean in sitting with mod assist initially to maintain sitting balance. Pt able to maintain static sitting with CGA while therapist donned R prosthetic total assist. Slideboard transfer to w/c this session with max-total assist, facilitation and cues for anterior weightshift and assist for R prosthetic positioning/placement to increased weightbearing. Pt propelled w/c x 100 ft usinng B UEs with supervision, transported the rest of the way. Pt worked on anterior weightshifting/forward lean in order to  reach for and toss horseshoes this session x 2 trials. Pt worked on B UE strengthening to perform shoulder press with 1# dumbbell 2 x 10,shoulder flexion 2 x 10 with 1# dumbbell and  to perform scapular retraction/UE horizontal abduction 2 x 10. Seated in w/c worked on R LE knee flexion/extension AROM, x 10 with cues for techniques. Pt transported back to room and performed lateral scoot transfer with slideboard and max assist, cues for techniques and assist for R LE/prosthetic placement to increase weightbearing. Sit>supine with mod assist, doffed prosthetic and L limb guard. Therapist performed gentle L LE PROM for knee extension and left supine with needs in reach and bed alarm set, daughter present.   PT Evaluation Precautions/Restrictions Precautions Precautions: Fall Precaution Comments: prior R transtibial amputation; wound VAC to L residual limb Required Braces or Orthoses: Other Brace Other Brace: Limbguard LLE Restrictions Weight Bearing Restrictions: Yes LLE Weight Bearing: Non weight bearing Other Position/Activity Restrictions: R BK amp is healed; Prosthesis is new; he is on a wearing/breaking in schedule now General   Vital Signs  Pain Pain Assessment Pain Scale: 0-10 Pain Score: 10-Worst pain ever Pain Type: Acute pain Pain Location: Leg Pain Descriptors / Indicators: Discomfort Pain Frequency: Intermittent Pain Onset: On-going Pain Intervention(s): Medication (See eMAR) Home Living/Prior Functioning Home Living Available Help at Discharge: Family;Available 24 hours/day Type of Home: House Home Access: Ramped entrance Home Layout: One level Bathroom Shower/Tub: Multimedia programmer: Standard Bathroom Accessibility: Yes  Lives With: Family(reports his son had been living with him) Prior Function Level of Independence: Needs assistance with ADLs;Needs assistance with tranfers  Able to Take Stairs?: No Driving: No Comments: patietn wheelchair level since 02/2018, reports using a stedy lift at home Cognition Overall Cognitive Status: Within Functional Limits for tasks assessed Arousal/Alertness: Awake/alert Orientation Level:  Oriented X4 Attention: Sustained Sustained Attention: Appears intact Memory: Impaired Memory Impairment: Decreased recall of new information Awareness: Impaired Awareness Impairment: Intellectual impairment Safety/Judgment: Appears intact Sensation Sensation Light Touch: Impaired Detail Light Touch Impaired Details: Impaired RLE;Impaired LLE Coordination Gross Motor Movements are Fluid and Coordinated: No Fine Motor Movements are Fluid and Coordinated: Yes Coordination and Movement Description: gross motor cordination impaired secondary to B BKA Motor  Motor Motor: Abnormal postural alignment and control Motor - Skilled Clinical Observations: generalized weakness  Mobility Bed Mobility Bed Mobility: Supine to Sit;Sit to Supine Supine to Sit: Maximal Assistance - Patient - Patient 25-49% Sit to Supine: Moderate Assistance - Patient 50-74% Transfers Transfers: Lateral/Scoot Transfers Sit to Stand: 2 Helpers Sit to Stand Comment: total assist +2 for sit<>stand within stedy Lateral/Scoot Transfers: Total Assistance - Patient < 25% Transfer via Lift Equipment: Animal nutritionist: No Architect: Yes Wheelchair Assistance: Chartered loss adjuster: Both upper extremities Wheelchair Parts Management: Needs assistance Distance: 150 ft  Trunk/Postural Assessment  Cervical Assessment Cervical Assessment: Exceptions to WFL(forward head posture) Thoracic Assessment Thoracic Assessment: Exceptions to WFL(rounded shoulders) Lumbar Assessment Lumbar Assessment: Exceptions to WFL(posterior pelvic tilt in sitting) Postural Control Postural Control: Deficits on evaluation Trunk Control: posterior lean  Balance Balance Balance Assessed: Yes Static Sitting Balance Static Sitting - Level of Assistance: 5: Stand by assistance;4: Min assist Dynamic Sitting Balance Dynamic Sitting - Level of Assistance: 3: Mod  assist Sitting balance - Comments: initial posterior lean in sitting requiring assist to correct, mod assist for dynamic sitting balance during lateral scoot transfer Extremity Assessment  RLE Assessment RLE Assessment: Exceptions to [facility] Passive Range of Motion (PROM)  Comments: lacking full knee extension/flexion, PROM 5 degrees- 90 degrees of flexion General Strength Comments: R BKA - grossly 3+/5 LLE Assessment LLE Assessment: Exceptions to [facility] Passive Range of Motion (PROM) Comments: lacking full knee extension- PROM limited to 25 - 90 degrees of flexion. General Strength Comments: grossly 3 to 3+/5 - edema and new BKA    Refer to Care Plan for Long Term Goals  Recommendations for other services: None   Discharge Criteria: Patient will be discharged from PT if patient refuses treatment 3 consecutive times without medical reason, if treatment goals not met, if there is a change in medical status, if patient makes no progress towards goals or if patient is discharged from hospital.  The above assessment, treatment plan, treatment alternatives and goals were discussed and mutually agreed upon: by patient  Netta Corrigan, PT, DPT, CSRS 12/26/2018, 9:33 AM

## 2018-12-26 NOTE — Plan of Care (Signed)
  Problem: RH BLADDER ELIMINATION Goal: RH STG MANAGE BLADDER WITH ASSISTANCE Description: STG Manage Bladder With mod I Assistance Outcome: Not Progressing; In and out cath; and pt seated to urinate but  only puts out small amount    Problem: RH PAIN MANAGEMENT Goal: RH STG PAIN MANAGED AT OR BELOW PT'S PAIN GOAL Description: Pain level less than 4 on scale of 0-10 Outcome: Not Progressing; have had pain issues this morning after therapy; MD notified and meds had been adjusted.

## 2018-12-27 LAB — GLUCOSE, CAPILLARY
Glucose-Capillary: 76 mg/dL (ref 70–99)
Glucose-Capillary: 121 mg/dL — ABNORMAL HIGH (ref 70–99)
Glucose-Capillary: 77 mg/dL (ref 70–99)
Glucose-Capillary: 144 mg/dL — ABNORMAL HIGH (ref 70–99)

## 2018-12-27 MED ORDER — OXYCODONE HCL 5 MG PO TABS
2.5000 mg | ORAL_TABLET | Freq: Four times a day (QID) | ORAL | Status: DC | PRN
  Administered 2018-12-30 – 2018-12-31 (×2): 5 mg via ORAL
  Filled 2018-12-27 (×3): qty 1

## 2018-12-27 NOTE — Plan of Care (Signed)
  Problem: RH BLADDER ELIMINATION Goal: RH STG MANAGE BLADDER WITH ASSISTANCE Description: STG Manage Bladder With mod I Assistance Outcome: Not Progressing; in and out cath

## 2018-12-27 NOTE — Progress Notes (Signed)
[facility] PHYSICAL MEDICINE & REHABILITATION PROGRESS NOTE   Subjective/Complaints: Had increased left stump pain yesterday afternoon. 5mg  of oxycodone finally relieved pain. Didn't receive another dose of that yesterday. Slept ok last night. Pain tolerable this morning  ROS: Patient denies fever, rash, sore throat, blurred vision, nausea, vomiting, diarrhea, cough, shortness of breath or chest pain,   headache, or mood change.     Objective:   No results found. Recent Labs    12/26/18 0539  WBC 8.4  HGB 9.4*  HCT 28.3*  PLT 379   Recent Labs    12/26/18 0539  NA 136  K 4.0  CL 102  CO2 25  GLUCOSE 90  BUN 24*  CREATININE 1.21  CALCIUM 8.9    Intake/Output Summary (Last 24 hours) at 12/27/2018 0854 Last data filed at 12/27/2018 0411 Gross per 24 hour  Intake 297 ml  Output 2825 ml  Net -2528 ml     Physical Exam: Vital Signs Blood pressure 134/61, pulse (!) 58, temperature 98.9 F (37.2 C), temperature source Oral, resp. rate 18, weight 86.3 kg, SpO2 98 %. Constitutional: No distress . Vital signs reviewed. HEENT: EOMI, oral membranes moist Neck: supple Cardiovascular: RRR without murmur. No JVD    Respiratory: CTA Bilaterally without wheezes or rales. Normal effort    GI: BS +, non-tender, non-distended  Ext: no edema Skin: Right BKA well-healed . Left BKA  with wound VAC in place. Appropriately tender. Groin bruise noted Neuro: alert and appropriate. Reasonable insight and awareness. Normal cn exam Musculoskeletal: 4/5 in upper extremities bilaterally. RLE 4/5. LLE limited by pain  Psych:pleasant    Assessment/Plan: 1. Functional deficits secondary to left BKA which require 3+ hours per day of interdisciplinary therapy in a comprehensive inpatient rehab setting.  Physiatrist is providing close team supervision and 24 hour management of active medical problems listed below.  Physiatrist and rehab team continue to assess barriers to  discharge/monitor patient progress toward functional and medical goals  Care Tool:  Bathing    Body parts bathed by patient: Right arm, Left arm, Chest, Abdomen, Face, Left upper leg, Right upper leg, Front perineal area   Body parts bathed by helper: Buttocks, Right upper leg, Left upper leg Body parts n/a: Left lower leg, Right lower leg   Bathing assist Assist Level: 2 Helpers(sit to stand in stedy for peri hygiene/buttocks)     Upper Body Dressing/Undressing Upper body dressing   What is the patient wearing?: Pull over shirt    Upper body assist Assist Level: Minimal Assistance - Patient > 75%    Lower Body Dressing/Undressing Lower body dressing      What is the patient wearing?: Pants     Lower body assist Assist for lower body dressing: Total Assistance - Patient < 25%     Toileting Toileting    Toileting assist Assist for toileting: Total Assistance - Patient < 25%     Transfers Chair/bed transfer  Transfers assist           Locomotion Ambulation   Ambulation assist              Walk 10 feet activity   Assist           Walk 50 feet activity   Assist           Walk 150 feet activity   Assist           Walk 10 feet on uneven surface  activity   Assist  Wheelchair     Assist               Wheelchair 50 feet with 2 turns activity    Assist            Wheelchair 150 feet activity     Assist          Blood pressure 134/61, pulse (!) 58, temperature 98.9 F (37.2 C), temperature source Oral, resp. rate 18, weight 86.3 kg, SpO2 98 %.  Medical Problem List and Plan: 1.  Impaired mobility and ADLs secondary to dehiscence of left amputation stump, left BKA             -patient may shower, but wound vac should be covered.             -ELOS/Goals: MinA in PT, OT, I in SLP  Patient is beginning CIR therapies today including PT and OT  2.  Antithrombotics: -DVT/anticoagulation:   Pharmaceutical: Other (comment)--Apixaban             -antiplatelet therapy: ASA/Plavix 3. Pain Management: pt with increased stump pain yesterday  -tramadol for moderate pain  -oxycodone 2.5-5mg  for more severe pain  -reviewed massage and sensory feedback 4. Mood: LCSW to follow for evaluation and support.              -antipsychotic agents: N/A 5. Neuropsych: This patient is capable of making decisions on hs own behalf. 6. Skin/Wound Care: Continue wound VAC for 7 total days. On Vitamin C and will add prostat to promote wound healing.  7. Fluids/Electrolytes/Nutrition:  Encourage PO   12/12-BUN sl elevate push fluids  -recheck monday 8.  Diastolic CHF: Monitor for signs of overload--abdominal girth has decreased.   Continue Lasix and heart healthy diet.   -daily weights Filed Weights   12/25/18 1648 12/26/18 0532  Weight: 86.8 kg 86.3 kg    9. BPH with urinary retention: On Proscar and Flomax. History of UTIs in past 3 months   12/13-pvr's 300-600cc   -OOB to toilet/ BSC to help with emptying. Encourage Double voids, extra time    -no urecholine d/t cv hx,    -ua negative, urine without odor    -pt already on max flomax dose 10. PAF: Monitor HR tid--on Eliquis and atrovastatin 11. ABLA: Continue iron supplement.  12. Constipation: Had small BM 12/11added Senna to colace.    -bm this am 12/12 13. T2DM: Will monitor BS ac/hs. On Glipizide 5 mg bid--use SSI prn.     12/13-fair control so far. Monitor for pattern 14. HLD: Lipitor 40mg  QD    LOS: 2 days A FACE TO FACE EVALUATION WAS PERFORMED  Meredith Staggers 12/27/2018, 8:54 AM

## 2018-12-28 ENCOUNTER — Inpatient Hospital Stay (HOSPITAL_COMMUNITY): Payer: Medicare Other

## 2018-12-28 DIAGNOSIS — N401 Enlarged prostate with lower urinary tract symptoms: Secondary | ICD-10-CM

## 2018-12-28 DIAGNOSIS — R338 Other retention of urine: Secondary | ICD-10-CM

## 2018-12-28 DIAGNOSIS — S88112A Complete traumatic amputation at level between knee and ankle, left lower leg, initial encounter: Secondary | ICD-10-CM

## 2018-12-28 HISTORY — DX: Benign prostatic hyperplasia with lower urinary tract symptoms: N40.1

## 2018-12-28 HISTORY — DX: Other retention of urine: R33.8

## 2018-12-28 LAB — BASIC METABOLIC PANEL
Anion gap: 11 (ref 5–15)
BUN: 30 mg/dL — ABNORMAL HIGH (ref 8–23)
CO2: 23 mmol/L (ref 22–32)
Calcium: 8.9 mg/dL (ref 8.9–10.3)
Chloride: 102 mmol/L (ref 98–111)
Creatinine, Ser: 1.22 mg/dL (ref 0.61–1.24)
GFR calc Af Amer: >60 mL/min (ref >60)
GFR calc non Af Amer: 52 mL/min — ABNORMAL LOW (ref >60)
Glucose, Bld: 80 mg/dL (ref 70–99)
Potassium: 3.8 mmol/L (ref 3.5–5.1)
Sodium: 136 mmol/L (ref 135–145)

## 2018-12-28 LAB — GLUCOSE, CAPILLARY
Glucose-Capillary: 78 mg/dL (ref 70–99)
Glucose-Capillary: 95 mg/dL (ref 70–99)
Glucose-Capillary: 123 mg/dL — ABNORMAL HIGH (ref 70–99)
Glucose-Capillary: 220 mg/dL — ABNORMAL HIGH (ref 70–99)

## 2018-12-28 LAB — URINE CULTURE: Culture: NO GROWTH

## 2018-12-28 NOTE — Care Management (Signed)
[facility] Individual Statement of Services  Patient Name:  [name]  Date:  12/28/2018  Welcome to the [facility].  Our goal is to provide you with an individualized program based on your diagnosis and situation, designed to meet your specific needs.  With this comprehensive rehabilitation program, you will be expected to participate in at least 3 hours of rehabilitation therapies Monday-Friday, with modified therapy programming on the weekends.  Your rehabilitation program will include the following services:  Physical Therapy (PT), Occupational Therapy (OT), 24 hour per day rehabilitation nursing, Therapeutic Recreaction (TR), Neuropsychology, Case Management (Social Worker), Rehabilitation Medicine, Nutrition Services and Pharmacy Services  Weekly team conferences will be held on Tuesdays to discuss your progress.  Your Social Worker will talk with you frequently to get your input and to update you on team discussions.  Team conferences with you and your family in attendance may also be held.  Expected length of stay: 10-16 days   Overall anticipated outcome: moderate assistance  Depending on your progress and recovery, your program may change. Your Social Worker will coordinate services and will keep you informed of any changes. Your Social Worker's name and contact numbers are listed  below.  The following services may also be recommended but are not provided by the [facility] will be made to provide these services after discharge if needed.  Arrangements include referral to agencies that provide these services.  Your insurance has been verified to be:  [facility] Medicare Your primary doctor is:  [place]  Pertinent information will be shared with your doctor and your insurance company.  Social Worker:  [place], [place] or ([phone]   Information discussed with and copy given to patient by: [name], 12/28/2018, 4:34 PM

## 2018-12-28 NOTE — IPOC Note (Addendum)
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 ([place])   DNR (do not resuscitate) discussion   Acquired absence of right leg below knee ([place])   S/P BKA (below knee amputation) unilateral, right ([place])   Urinary retention due to benign prostatic hyperplasia     Functional Problem List: Nursing    PT Balance, Behavior, Edema, Motor, Pain, Safety, Sensory, Skin Integrity  OT Balance, Endurance, Motor, Pain, Safety, Skin Integrity  SLP    TR         Basic ADL's: OT Bathing, Dressing, Toileting     Advanced  ADL's: OT       Transfers: PT Bed Mobility, Bed to Chair, Car, Chief Operating Officer: PT Ambulation, Emergency planning/management officer, Stairs     Additional Impairments: OT    SLP        TR      Anticipated Outcomes Item Anticipated Outcome  Self Feeding    Swallowing      Basic self-care     Toileting      Bathroom Transfers mod A toilet  Bowel/Bladder     Transfers  mod assist  Locomotion  supervision w/c mobility  Communication     Cognition     Pain     Safety/Judgment      Therapy Plan: PT Intensity: Minimum of 1-2 x/day ,45 to 90 minutes PT Frequency: 5 out of 7 days PT Duration Estimated Length of Stay: 10-14 days OT Intensity: Minimum of 1-2 x/day, 45 to 90 minutes OT Frequency: 5 out of 7 days OT Duration/Estimated Length of Stay: 14-16     Due to the current state of emergency, patients may not be receiving their 3-hours of Medicare-mandated therapy.   Team Interventions: Nursing Interventions     PT interventions Balance/vestibular training, Cognitive remediation/compensation, Discharge planning, Disease management/prevention, DME/adaptive equipment instruction, Functional mobility training, Neuromuscular re-education, Pain management, Patient/family education, Psychosocial support, Skin care/wound management, Splinting/orthotics, Therapeutic Activities, Therapeutic Exercise, UE/LE Strength taining/ROM, UE/LE Coordination activities, Wheelchair propulsion/positioning  OT Interventions Balance/vestibular training, Discharge planning, Pain management, Self Care/advanced ADL retraining, Therapeutic Activities, UE/LE Coordination activities, Functional mobility training, Patient/family education, Skin care/wound managment, Therapeutic Exercise, DME/adaptive equipment instruction, Psychosocial support, Splinting/orthotics, UE/LE Strength taining/ROM, Wheelchair propulsion/positioning  SLP Interventions    TR Interventions    SW/CM Interventions     Barriers to Discharge MD  Medical stability, Neurogenic bowel and bladder, Wound care, Lack of/limited family support, Weight, Weight bearing restrictions and Nutritional means  Nursing      PT      OT      SLP      SW       Team Discharge Planning: Destination: PT-Home ,OT- Home , SLP-  Projected Follow-up: PT-Home health PT, 24 hour supervision/assistance, OT-  Home health OT, SLP-  Projected Equipment Needs: PT-To be determined, OT-  , SLP-  Equipment Details: PT-pt has w/c, slideboard, and a power assisted standing lift, OT-hoyer lift? Patient/family involved in discharge planning: PT- Patient,  OT-Patient, SLP-   MD ELOS: 10-14 days Medical Rehab Prognosis:  Fair Assessment: Pt is  an 85 yr old male with diastolic CHF, malnutrition, and previous R BKA this year that came in initially with L TMA, however was not healing and went 12/4 for L BKA- he's on tramadol and low dose oxy for pain; he's still having difficulty with urinary  retention due to BPH- spoke to Dr Jeffie Pollock urology who felt we should put in foley and have him see Urology- will d/w family. Also has pAFib on Eliquis, and DMII with good BG control; Also has ABLA on iron and some constipation.   Goals- supervision to mod assist- 10-14 days  See Team Conference Notes for weekly updates to the plan of care

## 2018-12-28 NOTE — Progress Notes (Addendum)
Physical Therapy Session Note  Patient Details  Name: [name] MRN: [id number] Date of Birth: Aug 01, 1927  Today's Date: 12/28/2018 PT Individual Time: 0800-0900 PT Individual Time Calculation (min): 60 min   Short Term Goals: Week 1:  PT Short Term Goal 1 (Week 1): Pt will be able to demonstrate functional dynamic sitting balance with CGA. PT Short Term Goal 2 (Week 1): Pt will be able to perform bed <> chair transfers with mod assist.  Skilled Therapeutic Interventions/Progress Updates:     Session 1: Patient in bed upon PT arrival. Patient alert and agreeable to PT session. Patient reported mild L residual limb pain during session, RN made aware. PT provided repositioning, rest breaks, and distraction as pain interventions throughout session. R shrinker donned and L wound vac, shrinker, and limb guard donned at beginning of session.   Therapeutic Activity: Bed Mobility: Patient performed rolling R/L x3 to don incontinence brief and shorts with max A. PT doffed R shrinker and donned liner for prosthesis in supine with total A. He performed supine to sit with max A for trunk support. Provided verbal cues for rolling R and pushing up through UEs to sit up. Presented with posterior lean in sitting initially and required max A to scoot EOB. Progressed to CGA in sitting for balance without posterior lean. PT donned R prosthesis with total A. Required stretch to R knee for knee flexion to click prosthesis into place, 12 clicks with 1 sock. Reviewed wearing schedule, liner during the day and prosthesis 2 hours 2x per day, increasing by 30 min each day at tolerated. Also educated on washing liner daily with soap and water, PT washed liner prior donning it during session. Patient able to teach back education with min cues, will need reinforcement and family education.  Transfers: Patient performed slide board transfers R/L x3 with max progressing to mod A. Provided verbal cues and demonstration for  head-hips relationship, pushing through R prosthesis to lift hips, hand placement, board placement, and w/c set up.  Wheelchair Mobility:  Patient propelled wheelchair 105 feet with supervision using B UEs. Provided verbal cues for turning technique.  Patient in w/c in room at end of session with breaks locked, seat belt alarm set, and all needs within reach.   Session 2: Patient in w/c in room upon PT arrival. Patient alert and agreeable to PT session. Patient reported 5/10 L residual limb pain during session, RN made aware. PT provided repositioning, rest breaks, and distraction as pain interventions throughout session. R prosthesis donned prior to session, per patient's report it had been on since his morning session at 8:00 am.  Therapeutic Activity: Bed Mobility: Patient performed sit to supine with supervision for safety with use of bed rail. Transfers: Patient attempted sit to/from stand x1 at the // bars with max A, patient unable to come to a complete stand due to difficulty shifting weight to his R LE. Provided verbal cues and facilitation for forward and R weight shift. Patient requested to go back to the room to have a BM after first trial. Patient performed sit to/form stand using the [facility] with max A +1 to get to the [facility], required total A for pulling pants down. Patient reported increased pain in L residual limb due to pressure from shin pad on Stedy, RN made aware and changed safety sheet for maxi-move for transfers with nursing. Patient required total A for peri care and LB dressing performing lateral leans with max A and cues for pushing  throughout his R prosthesis. He performed a slide board transfer with max-total A from the [facility] to the bed. PT doffed L limb guard and shrinker and R prosthesis and liner with total Awith patient in supine in bed. Noted increased redness on posterior calf on L LE and tibial plateau and thigh of R LE, no signs of skin breakdown or tearing. PT washed and  dried L residual limb above wound vac and R residual limb with total A and instructed patient to leave his skin open to air for at least 20 min to reduce redness. RN made aware and stated she would don B shrinkers in 20-30 min if redess resolves.   Patient in bed at end of session with breaks locked, bed alarm set, and all needs within reach.    Therapy Documentation Precautions:  Precautions Precautions: Fall Precaution Comments: prior R transtibial amputation; wound VAC to L residual limb Required Braces or Orthoses: Other Brace Other Brace: Limbguard LLE Restrictions Weight Bearing Restrictions: Yes LLE Weight Bearing: Non weight bearing Other Position/Activity Restrictions: R BK amp is healed; Prosthesis is new; he is on a wearing/breaking in schedule now    Therapy/Group: Individual Therapy  Cathy Ropp L Lorisa Scheid PT, DPT  12/28/2018, 12:19 PM

## 2018-12-28 NOTE — Progress Notes (Signed)
Occupational Therapy Session Note  Patient Details  Name: SIRUS LABRIE MRN: [id number] Date of Birth: August 18, 1927  Today's Date: 12/28/2018 OT Individual Time: 1000-1055 OT Individual Time Calculation (min): 55 min    Short Term Goals: Week 1:  OT Short Term Goal 1 (Week 1): Pt wil transfer to [facility] with LRAD and MAX A of 1 to decrease burden of care OT Short Term Goal 2 (Week 1): Pt will sit EOB with S for 5 min to demo improved sitting balance OT Short Term Goal 3 (Week 1): Pt will complete lateral leans with MOD A in prep for toileting  Skilled Therapeutic Interventions/Progress Updates:    Pt resting in w/c upon arrival.  Pt completed grooming tasks at sink before exiting room.  OT intervention with focus on w/c mobility, discharge planning, BUE therex, and activity tolerance to increase independence with BADLs.  Pt amb w/c in simulated home envirionment with supervision.  Pt requires more than a reasonable amount of time with multiple rest breaks.  BUE therex on Sci[employer] (load 2 for 5 minsX2 in opposite directions.  Pt returned to room and remained in w/c with all needs within reach and belt alarm activated.   Therapy Documentation Precautions:  Precautions Precautions: Fall Precaution Comments: prior R transtibial amputation; wound VAC to L residual limb Required Braces or Orthoses: Other Brace Other Brace: Limbguard LLE Restrictions Weight Bearing Restrictions: Yes LLE Weight Bearing: Non weight bearing Other Position/Activity Restrictions: R BK amp is healed; Prosthesis is new; he is on a wearing/breaking in schedule now  Pain:  Pt c/o 6/10 LLE phantom pain (foot); RN aware and meds admin prior to therapy, emotional support   Therapy/Group: Individual Therapy  [name] 12/28/2018, 11:01 AM

## 2018-12-28 NOTE — Progress Notes (Signed)
Late note: Discussed options of I/O caths v/s indwelling foley with patient. He reported that I/O caths are not too bad but that foley might be OK too. Dicussed that foley would put him at higher risk for infection and we could monitor to see if bladder function improves with increase in activity. He is willing to try this for a couple of days.

## 2018-12-28 NOTE — Progress Notes (Addendum)
[place] PHYSICAL MEDICINE & REHABILITATION PROGRESS NOTE   Subjective/Complaints:  Pt reports LLE feels "weird" feels numb- has wound VAC in place, which could make that numbness worse.  Getting cathed although voiding some.    ROS: Patient denies fever, rash, sore throat, blurred vision, nausea, vomiting, diarrhea, cough, shortness of breath or chest pain,   headache, or mood change.     Objective:   No results found. Recent Labs    12/26/18 0539  WBC 8.4  HGB 9.4*  HCT 28.3*  PLT 379   Recent Labs    12/26/18 0539 12/28/18 0558  NA 136 136  K 4.0 3.8  CL 102 102  CO2 25 23  GLUCOSE 90 80  BUN 24* 30*  CREATININE 1.21 1.22  CALCIUM 8.9 8.9    Intake/Output Summary (Last 24 hours) at 12/28/2018 1042 Last data filed at 12/28/2018 0810 Gross per 24 hour  Intake 860 ml  Output 2075 ml  Net -1215 ml     Physical Exam: Vital Signs Blood pressure (!) 136/58, pulse (!) 58, temperature 98.5 F (36.9 C), temperature source Oral, resp. rate 18, weight 86.3 kg, SpO2 95 %. Constitutional: No distress . Vital signs reviewed. Awake, alert, more appropriate, more clear, PT  In room, lying in bed, but getting retention sock put on Wound VAC on LLE HEENT: EOMI, oral membranes moist Neck: supple Cardiovascular: RRR without murmur. No JVD    Respiratory: CTA Bilaterally without wheezes or rales. Normal effort    GI: BS +, non-tender, non-distended  Ext: no edema Skin: Right BKA well-healed . Left BKA  with wound VAC in place. Appropriately tender. Groin bruise noted Neuro: alert and appropriate. Reasonable insight and awareness. Normal cn exam Musculoskeletal: 4/5 in upper extremities bilaterally. RLE 4/5. LLE limited by pain  Psych:pleasant    Assessment/Plan: 1. Functional deficits secondary to left BKA which require 3+ hours per day of interdisciplinary therapy in a comprehensive inpatient rehab setting.  Physiatrist is providing close team supervision and 24  hour management of active medical problems listed below.  Physiatrist and rehab team continue to assess barriers to discharge/monitor patient progress toward functional and medical goals  Care Tool:  Bathing    Body parts bathed by patient: Right arm, Left arm, Chest, Abdomen, Face, Left upper leg, Right upper leg, Front perineal area   Body parts bathed by helper: Buttocks, Right upper leg, Left upper leg Body parts n/a: Left lower leg, Right lower leg   Bathing assist Assist Level: 2 Helpers(sit to stand in stedy for peri hygiene/buttocks)     Upper Body Dressing/Undressing Upper body dressing   What is the patient wearing?: Pull over shirt    Upper body assist Assist Level: Minimal Assistance - Patient > 75%    Lower Body Dressing/Undressing Lower body dressing      What is the patient wearing?: Incontinence brief     Lower body assist Assist for lower body dressing: Total Assistance - Patient < 25%     Toileting Toileting    Toileting assist Assist for toileting: Total Assistance - Patient < 25%     Transfers Chair/bed transfer  Transfers assist           Locomotion Ambulation   Ambulation assist              Walk 10 feet activity   Assist           Walk 50 feet activity   Assist  Walk 150 feet activity   Assist           Walk 10 feet on uneven surface  activity   Assist           Wheelchair     Assist               Wheelchair 50 feet with 2 turns activity    Assist            Wheelchair 150 feet activity     Assist          Blood pressure (!) 136/58, pulse (!) 58, temperature 98.5 F (36.9 C), temperature source Oral, resp. rate 18, weight 86.3 kg, SpO2 95 %.  Medical Problem List and Plan: 1.  Impaired mobility and ADLs secondary to dehiscence of left amputation stump, left BKA             -patient may shower, but wound vac should be covered.             -ELOS/Goals:  MinA in PT, OT, I in SLP  Patient is beginning CIR therapies today including PT and OT  2.  Antithrombotics: -DVT/anticoagulation:  Pharmaceutical: Other (comment)--Apixaban             -antiplatelet therapy: ASA/Plavix 3. Pain Management: pt with increased stump pain yesterday  -tramadol for moderate pain  -oxycodone 2.5-5mg  for more severe pain  -reviewed massage and sensory feedback 4. Mood: LCSW to follow for evaluation and support.              -antipsychotic agents: N/A 5. Neuropsych: This patient is capable of making decisions on hs own behalf. 6. Skin/Wound Care: Continue wound VAC for 7 total days. On Vitamin C and will add prostat to promote wound healing.  12/14- surgery 12/4, so should be able to remove wound VAC- will do so today- placed order  7. Fluids/Electrolytes/Nutrition:  Encourage PO   12/12-BUN sl elevate push fluids  -recheck monday 8.  Diastolic CHF: Monitor for signs of overload--abdominal girth has decreased.   Continue Lasix and heart healthy diet.   -daily weights Filed Weights   12/25/18 1648 12/26/18 0532 12/28/18 0340  Weight: 86.8 kg 86.3 kg 86.3 kg    9. BPH with urinary retention: On Proscar and Flomax. History of UTIs in past 3 months   12/13-pvr's 300-600cc   -OOB to toilet/ BSC to help with emptying.  Encourage Double voids, extra time    -no urecholine d/t cv hx,    -ua negative, urine without odor    -pt already on max flomax dose  12/14- will just touch base with Urology to see if they have any other ideas? They suggested a foley and have him f/u- talked with son Kyce Ging about it. They decided want a foley. Will see if pt wants foley now, or wait til d/c. 10. PAF: Monitor HR tid--on Eliquis and atrovastatin 11. ABLA: Continue iron supplement.  12. Constipation: Had small BM 12/11added Senna to colace.    -bm this am 12/12 13. T2DM: Will monitor BS ac/hs. On Glipizide 5 mg bid--use SSI prn.     CBG (last 3)  Recent Labs     12/27/18 1629 12/27/18 2150 12/28/18 0624  GLUCAP 77 144* 78      12/13-fair control so far. Monitor for pattern  12/14- a few lower, but not low BGs- con't meds 14. HLD: Lipitor 40mg  QD    LOS: 3 days A FACE TO FACE EVALUATION  WAS PERFORMED  Kiet Geer 12/28/2018, 10:42 AM

## 2018-12-28 NOTE — Progress Notes (Signed)
Social Work  Assessment and Plan  Patient Details  Name: [name]  MRN: [id number]  Date of Birth: 07/02/1927  Today's Date: 12/11/2018  Problem List:       Patient Active Problem List   Diagnosis Date Noted  . History of transmetatarsal amputation of left foot ([place]) 12/09/2018  . Critical limb ischemia with history of revascularization of same extremity   . Gangrene of left foot ([place])   . Diabetic ulcer of toe of left foot associated with type 2 diabetes mellitus ([place])   . Cellulitis of left lower extremity   . Toe infection 11/18/2018  . Peripheral edema   . Acute on chronic diastolic CHF (congestive heart failure) ([place]) 11/09/2018  . S/P BKA (below knee amputation) unilateral, right ([place]) 10/05/2018  . Hyponatremia   . Essential hypertension   . Postoperative pain   . Pain of left heel   . Unable to maintain weight-bearing   . Type 2 diabetes mellitus with diabetic peripheral angiopathy with gangrene ([place])   . Anemia due to acute blood loss   . Stage 3 chronic kidney disease   . Acquired absence of right leg below knee ([place]) 09/07/2018  . Gangrene of right foot ([place])   . DNR (do not resuscitate) discussion   . Goals of care, counseling/discussion   . Palliative care by specialist   . Type 2 diabetes mellitus with foot ulcer and gangrene ([place])   . Severe protein-calorie malnutrition ([place])   . Ischemic foot 08/20/2018  . Critical lower limb ischemia 08/03/2018  . Type 2 diabetes mellitus with foot ulcer, without long-term current use of insulin ([place])   . Gastroesophageal reflux disease   . Cellulitis of great toe of right foot 05/29/2018  . Atrial fibrillation, chronic   . Chest pain 07/25/2017  . PAF (paroxysmal atrial fibrillation) ([place]) 07/25/2017  . BPH (benign prostatic hyperplasia) 07/25/2017  . Shortness of breath 05/11/2017  . Hypothyroidism 05/11/2017  . Chronic diastolic CHF (congestive heart failure) ([place]) 05/11/2017  . Moderate aortic  stenosis 06/12/2016  . RBBB 06/12/2016  . Peripheral vascular disease ([place]) 08/04/2012  . Essential hypertension, benign 08/04/2012  . Hyperlipidemia 08/04/2012  . Dizziness 08/04/2012   Past Medical History:       Past Medical History:  Diagnosis Date  . Arthritis   . Borderline diabetic   . Diabetes mellitus without complication ([place])   . Glaucoma   . Hyperlipidemia   . Hypertension   . PVD (peripheral vascular disease) ([place])   . Sleep apnea    Cpap ordered but doesnt use   Past Surgical History:        Past Surgical History:  Procedure Laterality Date  . ABDOMINAL AORTOGRAM W/LOWER EXTREMITY N/A 07/27/2018   Procedure: ABDOMINAL AORTOGRAM W/LOWER EXTREMITY; Surgeon: Lorretta Harp, MD; Location: [facility]; Service: Cardiovascular; Laterality: N/A;  . AMPUTATION Right 08/28/2018   Procedure: RIGHT BELOW KNEE AMPUTATION; Surgeon: [name], MD; Location: [facility]; Service: Orthopedics; Laterality: Right;  . AMPUTATION Left 12/02/2018   Procedure: LEFT TRANSMETATARSAL AMPUTATION AND NEGATIVE PRESSURE WOUND VAC PLACEMENT; Surgeon: [name], MD; Location: [facility]; Service: Orthopedics; Laterality: Left;  . APPENDECTOMY  1943  . CATARACT EXTRACTION  2012   x2  . COLONOSCOPY N/A 11/01/2014   Procedure: COLONOSCOPY; Surgeon: Aviva Signs, MD; [facility]; Service: Gastroenterology; Laterality: N/A;  . ESOPHAGOGASTRODUODENOSCOPY N/A 11/01/2014   Procedure: ESOPHAGOGASTRODUODENOSCOPY (EGD); Surgeon: Aviva Signs, MD; [facility]; Service: Gastroenterology; Laterality: N/A;  . HERNIA REPAIR Left   . INGUINAL HERNIA REPAIR Right 03/01/2013   Procedure: RIGHT INGUINAL HERNIORRHAPHY; Surgeon: Jamesetta So, MD; Location: [facility]; Service: General; Laterality: Right;  . INSERTION OF MESH Right 03/01/2013   Procedure: INSERTION OF MESH; Surgeon: Jamesetta So, MD; Location: [facility]; Service: General; Laterality: Right;  . LOWER EXTREMITY ANGIOGRAPHY  Right 08/25/2018   Procedure: LOWER EXTREMITY ANGIOGRAPHY; Surgeon: Wellington Hampshire, MD; Location: [facility]; Service: Cardiovascular; Laterality: Right;  . LOWER EXTREMITY ANGIOGRAPHY N/A 11/25/2018   Procedure: LOWER EXTREMITY ANGIOGRAPHY; Surgeon: Wellington Hampshire, MD; Location: [facility]; Service: Cardiovascular; Laterality: N/A;  . NM MYOCAR PERF WALL MOTION  06/05/2010   no significant ischemia  . PERIPHERAL VASCULAR ATHERECTOMY Right 08/03/2018   Procedure: PERIPHERAL VASCULAR ATHERECTOMY; Surgeon: Lorretta Harp, MD; Location: [facility]; Service: Cardiovascular; Laterality: Right;  . PERIPHERAL VASCULAR ATHERECTOMY Left 11/23/2018   Procedure: PERIPHERAL VASCULAR ATHERECTOMY; Surgeon: Lorretta Harp, MD; Location: [facility]; Service: Cardiovascular; Laterality: Left; sfa with dc balloon  . PERIPHERAL VASCULAR BALLOON ANGIOPLASTY Left 11/25/2018   Procedure: PERIPHERAL VASCULAR BALLOON ANGIOPLASTY; Surgeon: Wellington Hampshire, MD; Location: [facility]; Service: Cardiovascular; Laterality: Left; popliteal  . PERIPHERAL VASCULAR INTERVENTION Left 11/25/2018   Procedure: PERIPHERAL VASCULAR INTERVENTION; Surgeon: Wellington Hampshire, MD; Location: [facility]; Service: Cardiovascular; Laterality: Left; tibial peroneal trunk and peroneal stents   . stent  06/14/2010   left leg  . US ECHOCARDIOGRAPHY  01/21/2006   moderate mitral annular ca+, mild MR, AOV moderately sclerotic.   Social History: reports that he quit smoking about 53 years ago. He has quit using smokeless tobacco. His smokeless tobacco use included chew. He reports that he does not drink alcohol or use drugs.  Family / Support Systems  Marital Status: Widow/Widower  Patient Roles: Parent  Children: son, Joas Motton [facility]) @ [phone]; daughter, [name] @ [phone]; son, Byrl Latin @ [phone]  Anticipated Caregiver: sons and daughter  Ability/Limitations  of Caregiver: Daughter and her family recently with COVID 33, son [name]) does NOT have COVID  Caregiver Availability: 24/7  Family Dynamics: All 3 adult children have been providing 24/7 care to patient for several months. Son, [name], notes that they are working together with him providing most of the weekday coverage as his siblings are still working.  Social History  Preferred language: English  Religion: Protestant  Cultural Background: NA  Read: Yes  Write: Yes  Employment Status: Retired  Public relations account executive Issues: None  Guardian/Conservator: None - per MD, pt is capable of making decisions on his own behalf.  Abuse/Neglect  Abuse/Neglect Assessment Can Be Completed: Yes  Physical Abuse: Denies  Verbal Abuse: Denies  Sexual Abuse: Denies  Exploitation of patient/patient's resources: Denies  Self-Neglect: Denies  Emotional Status  Pt's affect, behavior and adjustment status: Pt lying in bed, HOH and difficult to engage. Son provides most of intake information. Pt does not appear in any emotional distress. Son notes that he has c/o pain but "little else".  Recent Psychosocial Issues: BKA in Sept 2020  Psychiatric History: None  Substance Abuse History: None  Patient / Family Perceptions, Expectations & Goals  Pt/Family understanding of illness & functional limitations: Pt and family with good understanding of medical issues which resulted in another amputation.  Premorbid pt/family roles/activities: Adult children providing 24/7 assistance.  Anticipated changes in roles/activities/participation: Little change anticipated - family committed  to continuing providing assistance.  Pt/family expectations/goals: "We want to get his transfers better."  [employer]: None  Premorbid Home Care/DME Agencies: Other (Comment)([facility])  Transportation available at discharge: yes  Discharge Planning  Living Arrangements: Alone(with adult  children staying to provide 24/7 assist)  Support Systems: Children  Type of Residence: Private residence  Insurance Resources: [employer]  Financial Resources: [place] Referred: No  Living Expenses: Own  Money Management: Family  Does the patient have any problems obtaining your medications?: No  Home Management: family  Patient/Family Preliminary Plans: Pt to d/c home with family to resume 24/7 support.  Social Work Anticipated Follow Up Needs: HH/OP  Expected length of stay: ELOS 10 to 14 days  Clinical Impression  Elderly gentleman who returns to CIR following additional amputation (here in Sept/Oct 2020 following BKA). Son at bedside and provides most intake information as pt is [facility] and not very engaged in process. Family fully intends to resume 24/7 assistance coverage but hopeful he may be better with transfers. Will follow for support and d/c planning needs.  Navjot Loera  12/11/2018, 2:57 PM

## 2018-12-28 NOTE — Plan of Care (Signed)
  Problem: Consults Goal: RH GENERAL PATIENT EDUCATION Description: See Patient Education module for education specifics. Outcome: Progressing   Problem: RH BOWEL ELIMINATION Goal: RH STG MANAGE BOWEL WITH ASSISTANCE Description: STG Manage Bowel with Min Assistance. Outcome: Progressing   Problem: RH BLADDER ELIMINATION Goal: RH STG MANAGE BLADDER WITH ASSISTANCE Description: STG Manage Bladder With mod I Assistance Outcome: Progressing   Problem: RH SKIN INTEGRITY Goal: RH STG ABLE TO PERFORM INCISION/WOUND CARE W/ASSISTANCE Description: STG Able To Perform Incision/Wound Care With min Assistance. Outcome: Progressing   Problem: RH SAFETY Goal: RH STG ADHERE TO SAFETY PRECAUTIONS W/ASSISTANCE/DEVICE Description: STG Adhere to Safety Precautions With cues and reminders  Outcome: Progressing Goal: RH STG DECREASED RISK OF FALL WITH ASSISTANCE Description: STG Decreased Risk of Fall With cues and reminders Outcome: Progressing   Problem: RH PAIN MANAGEMENT Goal: RH STG PAIN MANAGED AT OR BELOW PT'S PAIN GOAL Description: Pain level less than 4 on scale of 0-10 Outcome: Progressing   Problem: RH KNOWLEDGE DEFICIT GENERAL Goal: RH STG INCREASE KNOWLEDGE OF SELF CARE AFTER HOSPITALIZATION Description: Pt will be able to adhere to medication regimen with mod I assist from family. Pt will be able to prevent infection to new amputation with mod I assist from family upon discharge.  Outcome: Progressing

## 2018-12-29 ENCOUNTER — Inpatient Hospital Stay (HOSPITAL_COMMUNITY): Payer: Medicare Other

## 2018-12-29 ENCOUNTER — Inpatient Hospital Stay (HOSPITAL_COMMUNITY): Payer: Medicare Other | Admitting: Occupational Therapy

## 2018-12-29 LAB — CBC
HCT: 27 % — ABNORMAL LOW (ref 39.0–52.0)
Hemoglobin: 9.1 g/dL — ABNORMAL LOW (ref 13.0–17.0)
MCH: 31 pg (ref 26.0–34.0)
MCHC: 33.7 g/dL (ref 30.0–36.0)
MCV: 91.8 fL (ref 80.0–100.0)
Platelets: 363 10*3/uL (ref 150–400)
RBC: 2.94 MIL/uL — ABNORMAL LOW (ref 4.22–5.81)
RDW: 16.5 % — ABNORMAL HIGH (ref 11.5–15.5)
WBC: 9.2 10*3/uL (ref 4.0–10.5)
nRBC: 0 % (ref 0.0–0.2)

## 2018-12-29 LAB — GLUCOSE, CAPILLARY
Glucose-Capillary: 81 mg/dL (ref 70–99)
Glucose-Capillary: 94 mg/dL (ref 70–99)
Glucose-Capillary: 136 mg/dL — ABNORMAL HIGH (ref 70–99)
Glucose-Capillary: 167 mg/dL — ABNORMAL HIGH (ref 70–99)

## 2018-12-29 MED ORDER — CHLORHEXIDINE GLUCONATE CLOTH 2 % EX PADS
6.0000 | MEDICATED_PAD | Freq: Every day | CUTANEOUS | Status: DC
  Administered 2018-12-29 – 2019-01-05 (×8): 6 via TOPICAL

## 2018-12-29 NOTE — Plan of Care (Signed)
  Problem: RH Balance Goal: LTG Patient will maintain dynamic sitting balance (PT) Description: LTG:  Patient will maintain dynamic sitting balance with assistance during mobility activities (PT) Flowsheets (Taken 12/29/2018 1041) LTG: Pt will maintain dynamic sitting balance during mobility activities with:: (with use of prosthesis. Upgraded goal) Supervision/Verbal cueing Note: Upgraded goal due to improved balance with use of prosthesis.    Problem: RH Pre-functional/Other (Specify) Goal: RH LTG PT (Specify) 1 Description:  RH LTG PT (Specify) 1 Flowsheets (Taken 12/29/2018 1048) LTG: Other PT (Specify) 1: Patient's family will perform Hoyer lift transfers with patient independently for safe transfers without prosthesis. Goal: RH LTG PT (Specify) 2 Description: RH LTG PT (Specify) 2 Flowsheets (Taken 12/29/2018 1048) LTG: Other PT (Specify) 2: Patient's family will be independent with donning/doffing R prosthesis and management of wearing schedule for prosthesis.

## 2018-12-29 NOTE — Patient Care Conference (Signed)
Inpatient RehabilitationTeam Conference and Plan of Care Update Date: 12/29/2018   Time: 11:35 AM    Patient Name: [name]      Medical Record Number: [id number]  Date of Birth: 09/05/1927 Sex: Male         Room/Bed: 4M06C/4M06C-01 Payor Info: Payor: Marine scientist / Plan: UHC MEDICARE / Product Type: *No Product type* /    Admit Date/Time:  12/25/2018  4:39 PM  Primary Diagnosis:  Unilateral complete BKA, left, initial encounter [facility])  Patient Active Problem List   Diagnosis Date Noted  . Urinary retention due to benign prostatic hyperplasia 12/28/2018  . Unilateral complete BKA, left, initial encounter ([place]) 12/25/2018  . Dehiscence of amputation stump ([place])   . History of transmetatarsal amputation of left foot ([place]) 12/09/2018  . Critical limb ischemia with history of revascularization of same extremity   . Gangrene of left foot ([place])   . Diabetic ulcer of toe of left foot associated with type 2 diabetes mellitus ([place])   . Cellulitis of left lower extremity   . Toe infection 11/18/2018  . Peripheral edema   . Acute on chronic diastolic CHF (congestive heart failure) ([place]) 11/09/2018  . S/P BKA (below knee amputation) unilateral, right ([place]) 10/05/2018  . Hyponatremia   . Essential hypertension   . Postoperative pain   . Pain of left heel   . Unable to maintain weight-bearing   . Type 2 diabetes mellitus with diabetic peripheral angiopathy with gangrene ([place])   . Anemia due to acute blood loss   . Stage 3 chronic kidney disease   . Acquired absence of right leg below knee ([place]) 09/07/2018  . Gangrene of right foot ([place])   . DNR (do not resuscitate) discussion   . Goals of care, counseling/discussion   . Palliative care by specialist   . Type 2 diabetes mellitus with foot ulcer and gangrene ([place])   . Severe protein-calorie malnutrition ([place])   . Ischemic foot 08/20/2018  . Critical lower limb ischemia 08/03/2018  . Type 2 diabetes mellitus with foot  ulcer, without long-term current use of insulin ([place])   . Gastroesophageal reflux disease   . Cellulitis of great toe of right foot 05/29/2018  . Atrial fibrillation, chronic   . Chest pain 07/25/2017  . PAF (paroxysmal atrial fibrillation) ([place]) 07/25/2017  . BPH (benign prostatic hyperplasia) 07/25/2017  . Shortness of breath 05/11/2017  . Hypothyroidism 05/11/2017  . Chronic diastolic CHF (congestive heart failure) ([place]) 05/11/2017  . Moderate aortic stenosis 06/12/2016  . RBBB 06/12/2016  . Peripheral vascular disease ([place]) 08/04/2012  . Essential hypertension, benign 08/04/2012  . Hyperlipidemia 08/04/2012  . Dizziness 08/04/2012    Expected Discharge Date: Expected Discharge Date: 01/05/19  Team Members Present: Physician leading conference: Dr. Courtney Heys Social Worker Present: [name], LCSW Nurse Present: Genene Churn, RN Case Manager: Karene Fry, RN PT Present: Apolinar Junes, PT OT Present: Roanna Epley, COTA;Jennifer Tamala Julian, OT SLP Present: Weston Anna, SLP PPS Coordinator present : Gunnar Fusi, Novella Olive, PT     Current Status/Progress Goal Weekly Team Focus  Bowel/Bladder   continent of B&B. LBM 12/13   I/O cath q6 or volumes >350 r/t urinary retention  to be able to empty bladder independently. Timed tolieting continue with I/O cath  assess  and monitor q shift.   Swallow/Nutrition/ Hydration             ADL's   bed mobility-rolling R<>L mod A; supine>sit-mod A; slide board transfers-max  A; sit<>stand with [place]-max A+2  toileting-mod A; dynamic sitting balance-supervisoin; lateral leaning on BSC with min A  bed mobility, slide board trafsers, [place] transfers, sitting balance, family education   Mobility   Max A supine to sit, min A rolling and sit to supine, mod-CGA sitting balance, mod-max A level slide board transfers with R prosthesis, max +2 sit<>stand in [place], supervision w/c >100 feet  Min A bed mobility, mod A transfers, supervision  w/c mobility 150 feet, family independnet with Hoyer lift transfers and donning/doffing prosthesis  Strengthening/ROM, sitting balance, bed mobility, transfers, Media planner with family, w/c mobility, residual limb care, amputee education, prosthesis education   Communication             Safety/Cognition/ Behavioral Observations            Pain   c/o pain left bka. 6/10  2/10  assess and monitor q shift   Skin   unilateral bka. guaze dressing to left bka (new) staples inplace. rt bka (old) .penis swollen  continue with dressing change to left bka. prevent further skin breakdown and infection  assess and monitor q shift    Rehab Goals Patient on target to meet rehab goals: Yes *See Care Plan and progress notes for long and short-term goals.     Barriers to Discharge  Current Status/Progress Possible Resolutions Date Resolved   Nursing                  PT  Lack of/limited family support;Neurogenic Bowel & Bladder  Patient has one caregiver at a time to assist  Patient has access to standing lift and Hoyer lift           OT                  SLP                SW                Discharge Planning/Teaching Needs:  Home with adult children (3) who rotate in providing 24/7 assistance.  Teaching to be completed prior to d/c.   Team Discussion: Requiring I+O caths, not emptying, can't do caths at home, wants foley placed, urology to follow after DC.  RN drsg changed, stump shrinker on, cont bowel, I+O cath, foley to be placed.  OT limited goals, use hoyer lift, toilet transfers max A, dtr teaches school.  PT wear prosthesis now, goal fam ed, mod A slideboard transfers, max +2 sit to stand, limited ROM, goals min A bed, mod A transfers, S w/c.  SW to call to arrange fam ed.   Revisions to Treatment Plan: N/A     Medical Summary Current Status: B/L BKAs-new L BKA; most goals are family ed- hoyer lift? vs R BKA prosthesis for transfers- donning prosthesis Weekly Focus/Goal:  family education/bed mobility; toilet transfers max assist- goals mod assist  Barriers to Discharge: Decreased family/caregiver support;Home enviroment access/layout;Behavior;Neurogenic Bowel & Bladder;Weight bearing restrictions;Weight;Wound care;Other (comments)  Barriers to Discharge Comments: wound care for L BKA- looks better; foley to be placed for urinary retention Possible Resolutions to Barriers: family education; prosthesis wearing 2 hrs/BID and increase daily- wearing schedule   Continued Need for Acute Rehabilitation Level of Care: The patient requires daily medical management by a physician with specialized training in physical medicine and rehabilitation for the following reasons: Direction of a multidisciplinary physical rehabilitation program to maximize functional independence : Yes Medical management of patient stability  for increased activity during participation in an intensive rehabilitation regime.: Yes Analysis of laboratory values and/or radiology reports with any subsequent need for medication adjustment and/or medical intervention. : Yes   I attest that I was present, lead the team conference, and concur with the assessment and plan of the team.   Retta Diones 12/29/2018, 10:08 PM  Team conference was held via web/ teleconference due to [place] - 19

## 2018-12-29 NOTE — Progress Notes (Signed)
Foley inserted after therapies today. 400 cc of red urine was drained upon insertion. Pt still draining dark red urine from catheter. No new issues.

## 2018-12-29 NOTE — Progress Notes (Signed)
Occupational Therapy Session Note  Patient Details  Name: [name] MRN: [id number] Date of Birth: January 18, 1927  Today's Date: 12/29/2018 OT Individual Time: 1330-1430 OT Individual Time Calculation (min): 60 min    Short Term Goals: Week 1:  OT Short Term Goal 1 (Week 1): Pt wil transfer to [facility] with LRAD and MAX A of 1 to decrease burden of care OT Short Term Goal 2 (Week 1): Pt will sit EOB with S for 5 min to demo improved sitting balance OT Short Term Goal 3 (Week 1): Pt will complete lateral leans with MOD A in prep for toileting  Skilled Therapeutic Interventions/Progress Updates:    Pt resting in bed upon arrival. Pt requested I look at his urine in urinal.  Urine with bright red color.  RN notified and examined specimen.  OT intervention with focus on bed moblity, sitting balance, and slide board transfer. Mod A for supine>sit EOB with use of bed rails.  Static sitting balance with supervison.  Dynamic sitting balance with min A. Tot A for donning RLE prosthesis sitting EOB. Slide board transfer to w/c with max A and max verbal cues for sequencing.  Pt remained seated in w/c with belt alarm activated and all needs within reach.   Therapy Documentation Precautions:  Precautions Precautions: Fall Precaution Comments: prior R transtibial amputation; wound VAC to L residual limb Required Braces or Orthoses: Other Brace Other Brace: Limbguard LLE Restrictions Weight Bearing Restrictions: Yes LLE Weight Bearing: Non weight bearing Other Position/Activity Restrictions: R BK amp is healed; Prosthesis is new; he is on a wearing/breaking in schedule now Pain:  Pt c/o LLE phantom pain ("it feels like something is pushing against my foot"); education, repositioning and emotional support   Therapy/Group: Individual Therapy  [name] 12/29/2018, 2:45 PM

## 2018-12-29 NOTE — Progress Notes (Signed)
Occupational Therapy Session Note  Patient Details  Name: [name] MRN: [id number] Date of Birth: 1927/03/17  Today's Date: 12/29/2018 OT Individual Time: 1100-1155 OT Individual Time Calculation (min): 55 min    Short Term Goals: Week 1:  OT Short Term Goal 1 (Week 1): Pt wil transfer to [facility] with LRAD and MAX A of 1 to decrease burden of care OT Short Term Goal 2 (Week 1): Pt will sit EOB with S for 5 min to demo improved sitting balance OT Short Term Goal 3 (Week 1): Pt will complete lateral leans with MOD A in prep for toileting  Skilled Therapeutic Interventions/Progress Updates:    Treatment session with focus on functional transfers, lateral leans, and BUE strengthening.  Pt received upright in w/c reporting need to toilet.  Completed slide board transfer w/c > wide drop arm BSC with max assist when transferring to Rt but total assist when transferring back to Lt post toileting.  Engaged in lateral leans with multimodal cues for complete lean to allow therapist to doff pants.  Pt continent of BM, required +2 for safety to facilitate leaning enough to allow for pants to be pulled back over hips.  Pt able to push up to lift buttocks enough to allow clearance of pants over buttocks.  Pt requested to return to bed.  Completed squat pivot transfer max assist w/c > bed with pt RUE on bed rail to assist with weight shift.  Pt seated EOB to allow for doffing of prosthesis.  Returned to semi-reclined without assistance.  Engaged in [place] with 3# dowel completing 3 sets of 10 chest presses and bicep curls.  Pt reporting fatigue.  Pt remained semi-reclined with all needs in reach.  Therapy Documentation Precautions:  Precautions Precautions: Fall Precaution Comments: prior R transtibial amputation; wound VAC to L residual limb Required Braces or Orthoses: Other Brace Other Brace: Limbguard LLE Restrictions Weight Bearing Restrictions: Yes LLE Weight Bearing: Non weight  bearing Other Position/Activity Restrictions: R BK amp is healed; Prosthesis is new; he is on a wearing/breaking in schedule now Pain:  Pt with no c/o pain   Therapy/Group: Individual Therapy  Simonne Come 12/29/2018, 12:32 PM

## 2018-12-29 NOTE — Progress Notes (Signed)
Patient continues to have urinary retention ---urinated 300 cc tea colored urine with 200 cc PVR a couple of hours ago followed by blood tinged urine a few minutes ago. Likely due to trauma from I/O as well having Eliquis on board. UA/UCS ordered. Will also order CBC. Will consult urology for input

## 2018-12-29 NOTE — Plan of Care (Signed)
  Problem: RH Balance Goal: LTG Patient will maintain dynamic sitting balance (PT) Description: LTG:  Patient will maintain dynamic sitting balance with assistance during mobility activities (PT) Flowsheets (Taken 12/29/2018 1041) LTG: Pt will maintain dynamic sitting balance during mobility activities with:: (with use of prosthesis. Upgraded goal) Supervision/Verbal cueing Note: Upgraded goal due to improved balance with use of prosthesis.

## 2018-12-29 NOTE — Progress Notes (Signed)
[place] PHYSICAL MEDICINE & REHABILITATION PROGRESS NOTE   Subjective/Complaints:  Pt reports LLE feels like 'hitting something" in bed- nothing there, so sounds like phantom pain.   Got wound VAC off- feeling better/less numb since wound VAC off.  Otherwise feels OK. Wants foley placed after d/w pt- doesn't like feeling of in/out caths and wants to make things easier on family.    ROS: Patient denies fever, rash, sore throat, blurred vision, nausea, vomiting, diarrhea, cough, shortness of breath or chest pain,   headache, or mood change.     Objective:   No results found. No results for input(s): WBC, HGB, HCT, PLT in the last 72 hours. Recent Labs    12/28/18 0558  NA 136  K 3.8  CL 102  CO2 23  GLUCOSE 80  BUN 30*  CREATININE 1.22  CALCIUM 8.9    Intake/Output Summary (Last 24 hours) at 12/29/2018 0914 Last data filed at 12/29/2018 0731 Gross per 24 hour  Intake 617 ml  Output 2325 ml  Net -1708 ml     Physical Exam: Vital Signs Blood pressure (!) 127/41, pulse (!) 53, temperature 98.3 F (36.8 C), resp. rate 16, weight 86.3 kg, SpO2 98 %. Constitutional: No distress . Vital signs reviewed. Awake, alert, more appropriate, more clear, alone; sitting up in bed, NAD HEENT: EOMI, oral membranes moist Neck: supple Cardiovascular: RRR without murmur. No JVD    Respiratory: CTA Bilaterally without wheezes or rales. Normal effort    GI: BS +, non-tender, non-distended  Ext: no edema Skin: Right BKA well-healed . Left BKA  with wound VAC off.    . Appropriately tender. Groin bruise noted Neuro: alert and appropriate. Reasonable insight and awareness. Normal cn exam Musculoskeletal: 4/5 in upper extremities bilaterally. RLE 4/5. LLE limited by pain  Psych:pleasant    Assessment/Plan: 1. Functional deficits secondary to left BKA which require 3+ hours per day of interdisciplinary therapy in a comprehensive inpatient rehab setting.  Physiatrist is  providing close team supervision and 24 hour management of active medical problems listed below.  Physiatrist and rehab team continue to assess barriers to discharge/monitor patient progress toward functional and medical goals  Care Tool:  Bathing    Body parts bathed by patient: Right arm, Left arm, Chest, Abdomen, Face, Left upper leg, Right upper leg, Front perineal area   Body parts bathed by helper: Buttocks, Right upper leg, Left upper leg Body parts n/a: Left lower leg, Right lower leg   Bathing assist Assist Level: 2 Helpers(sit to stand in stedy for peri hygiene/buttocks)     Upper Body Dressing/Undressing Upper body dressing   What is the patient wearing?: Pull over shirt    Upper body assist Assist Level: Supervision/Verbal cueing    Lower Body Dressing/Undressing Lower body dressing      What is the patient wearing?: Incontinence brief     Lower body assist Assist for lower body dressing: Total Assistance - Patient < 25%     Toileting Toileting    Toileting assist Assist for toileting: Total Assistance - Patient < 25%     Transfers Chair/bed transfer  Transfers assist     Chair/bed transfer assist level: Maximal Assistance - Patient 25 - 49% Chair/bed transfer assistive device: Sliding board, Prosthesis   Locomotion Ambulation   Ambulation assist   Ambulation activity did not occur: Safety/medical concerns(B BKA with R prosthesis)          Walk 10 feet activity   Assist  Walk  10 feet activity did not occur: Safety/medical concerns        Walk 50 feet activity   Assist Walk 50 feet with 2 turns activity did not occur: Safety/medical concerns         Walk 150 feet activity   Assist Walk 150 feet activity did not occur: Safety/medical concerns         Walk 10 feet on uneven surface  activity   Assist Walk 10 feet on uneven surfaces activity did not occur: Safety/medical concerns         Wheelchair     Assist  Will patient use wheelchair at discharge?: Yes Type of Wheelchair: Manual    Wheelchair assist level: Supervision/Verbal cueing Max wheelchair distance: 105'    Wheelchair 50 feet with 2 turns activity    Assist        Assist Level: Supervision/Verbal cueing   Wheelchair 150 feet activity     Assist      Assist Level: Supervision/Verbal cueing   Blood pressure (!) 127/41, pulse (!) 53, temperature 98.3 F (36.8 C), resp. rate 16, weight 86.3 kg, SpO2 98 %.  Medical Problem List and Plan: 1.  Impaired mobility and ADLs secondary to dehiscence of left amputation stump, left BKA             -patient may shower, but wound vac should be covered.             -ELOS/Goals: MinA in PT, OT, I in SLP  Patient is beginning CIR therapies today including PT and OT  2.  Antithrombotics: -DVT/anticoagulation:  Pharmaceutical: Other (comment)--Apixaban             -antiplatelet therapy: ASA/Plavix 3. Pain Management: pt with increased stump pain yesterday  -tramadol for moderate pain  -oxycodone 2.5-5mg  for more severe pain  -reviewed massage and sensory feedback 4. Mood: LCSW to follow for evaluation and support.              -antipsychotic agents: N/A 5. Neuropsych: This patient is capable of making decisions on hs own behalf. 6. Skin/Wound Care: Continue wound VAC for 7 total days. On Vitamin C and will add prostat to promote wound healing.  12/14- surgery 12/4, so should be able to remove wound VAC- will do so today- placed order   12/15- wound VAC off.  7. Fluids/Electrolytes/Nutrition:  Encourage PO   12/12-BUN sl elevate push fluids  -recheck monday 8.  Diastolic CHF: Monitor for signs of overload--abdominal girth has decreased.   Continue Lasix and heart healthy diet.   -daily weights Filed Weights   12/25/18 1648 12/26/18 0532 12/28/18 0340  Weight: 86.8 kg 86.3 kg 86.3 kg    9. BPH with urinary retention: On Proscar and Flomax. History of UTIs in past 3 months    12/13-pvr's 300-600cc   -OOB to toilet/ BSC to help with emptying.  Encourage Double voids, extra time    -no urecholine d/t cv hx,    -ua negative, urine without odor    -pt already on max flomax dose  12/14- will just touch base with Urology to see if they have any other ideas? They suggested a foley and have him f/u- talked with son Dayton Kenley about it. They decided want a foley. Will see if pt wants foley now, or wait til d/c.  12/15- placing Foley per d/w family (son) AND patient. Til Urology f/u. 10. PAF: Monitor HR tid--on Eliquis and atrovastatin 11. ABLA: Continue iron supplement.  12.  Constipation: Had small BM 12/11added Senna to colace.    -bm this am 12/12  12/15- LBM 12/13- large 13. T2DM: Will monitor BS ac/hs. On Glipizide 5 mg bid--use SSI prn.     CBG (last 3)  Recent Labs    12/28/18 1641 12/28/18 2106 12/29/18 0632  GLUCAP 123* 220* 81      12/13-fair control so far. Monitor for pattern  12/14- a few lower, but not low BGs- con't meds 14. HLD: Lipitor 40mg  QD    LOS: 4 days A FACE TO FACE EVALUATION WAS PERFORMED  [name] 12/29/2018, 9:14 AM

## 2018-12-29 NOTE — Progress Notes (Signed)
Physical Therapy Session Note  Patient Details  Name: [name] MRN: [id number] Date of Birth: April 10, 1927  Today's Date: 12/29/2018 PT Individual Time: 0900-1000 PT Individual Time Calculation (min): 60 min   Short Term Goals: Week 1:  PT Short Term Goal 1 (Week 1): Pt will be able to demonstrate functional dynamic sitting balance with CGA. PT Short Term Goal 2 (Week 1): Pt will be able to perform bed <> chair transfers with mod assist.  Skilled Therapeutic Interventions/Progress Updates:     Patient in bed upon PT arrival. Patient alert and agreeable to PT session. Patient reported 8/10 L residual limb pain during session, RN provided pain medicine prior to session. PT provided repositioning, rest breaks, and distraction as pain interventions throughout session. PT doffed B shrinkers, washed L shrinker due to mild drainage from incision site, then donned a new shrinker on L residual limb and prosthesis liner on R residual limb, rolled half way, just below his knee, with total A with patient in supine. Called [name], [facility], during session to discuss goals for mobility for patient with use of prosthesis and wear schedule for tolerance. Patient educated on goals and schedule for wearing prosthesis, also added schedule to patient's board and RN and OT made aware.   Therapeutic Activity: Bed Mobility: Patient performed supine to sit with mod A with HOB elevated and use of bed rails, as patient has a hospital bed at home. Provided verbal cues for rolling to R side then pushing up to sitting. He required mod-max A to scoot EOB and CGA for sitting balance initially EOB, progressed to supervision with cues for bringing his trunk forward and core muscle activation in sitting. PT had patient bend his R knee in sitting and rolled liner up with his knee bent to reduce pressure on his knee cap, per CPO's instructions. PT donned R prosthesis with total A, patient did assist with pushing his LE into the  socket, still required manual assist to achieve 14 clicks with one sock.  Transfers: Patient performed a slide board transfer bed>w/c to the L wearing his prosthesis with mod A and total A for board placement. Provided verbal cues for board placement, hand placement, head-hips relationship, and manual facilitation and cues for pushing through his R prosthesis to lift hips and scoot during transfer. He was able to scoot back in the chair after the transfer x2 with cues and manual assist for pushing through B UEs and R prosthesis with min-mod A.  Neuromuscular Re-ed: Patient performed dynamic sitting balance without his prosthesis and functional transfer training performing lateral leans to B elbows sitting EOB with supervision-min A. Provided cues for core activation to prevent posterior lean in sitting.  Patient sat EOB x6 min performing static sitting balance while CPO was called and placed on speaker, see above, with R prosthesis donned with supervision for safety with reduced sway and cues compared to without prosthesis.   Therapeutic Exercise: Patient performed the following exercises with verbal and tactile cues for proper technique. -R LAQ with prosthesis donned for strengthening and cue for increased knee flexion -R hip flexion with prosthesis donned for strengthening  Patient in w/c in room at end of session with breaks locked, seat belt alarm set, and all needs within reach.    Therapy Documentation Precautions:  Precautions Precautions: Fall Precaution Comments: prior R transtibial amputation; wound VAC to L residual limb Required Braces or Orthoses: Other Brace Other Brace: Limbguard LLE Restrictions Weight Bearing Restrictions: Yes LLE Weight  Bearing: Non weight bearing Other Position/Activity Restrictions: R BK amp is healed; Prosthesis is new; he is on a wearing/breaking in schedule now    Therapy/Group: Individual Therapy  [name] L [name] PT, DPT  12/29/2018, 12:20  PM

## 2018-12-30 ENCOUNTER — Inpatient Hospital Stay (HOSPITAL_COMMUNITY): Payer: Medicare Other

## 2018-12-30 ENCOUNTER — Inpatient Hospital Stay (HOSPITAL_COMMUNITY): Payer: Medicare Other | Admitting: *Deleted

## 2018-12-30 LAB — BASIC METABOLIC PANEL
Anion gap: 8 (ref 5–15)
BUN: 35 mg/dL — ABNORMAL HIGH (ref 8–23)
CO2: 26 mmol/L (ref 22–32)
Calcium: 8.9 mg/dL (ref 8.9–10.3)
Chloride: 103 mmol/L (ref 98–111)
Creatinine, Ser: 1.15 mg/dL (ref 0.61–1.24)
GFR calc Af Amer: >60 mL/min (ref >60)
GFR calc non Af Amer: 56 mL/min — ABNORMAL LOW (ref >60)
Glucose, Bld: 88 mg/dL (ref 70–99)
Potassium: 4 mmol/L (ref 3.5–5.1)
Sodium: 137 mmol/L (ref 135–145)

## 2018-12-30 LAB — URINE CULTURE: Culture: NO GROWTH

## 2018-12-30 LAB — GLUCOSE, CAPILLARY
Glucose-Capillary: 79 mg/dL (ref 70–99)
Glucose-Capillary: 97 mg/dL (ref 70–99)
Glucose-Capillary: 149 mg/dL — ABNORMAL HIGH (ref 70–99)
Glucose-Capillary: 150 mg/dL — ABNORMAL HIGH (ref 70–99)

## 2018-12-30 MED ORDER — SODIUM CHLORIDE 0.9 % IV BOLUS
500.0000 mL | Freq: Once | INTRAVENOUS | Status: AC
  Administered 2018-12-30: 500 mL via INTRAVENOUS

## 2018-12-30 NOTE — Progress Notes (Signed)
[place] PHYSICAL MEDICINE & REHABILITATION PROGRESS NOTE   Subjective/Complaints:  Pt reports still has bloody urine per nurse-  Low amount of urine in bag, so d/w nurse flushing foley/irrigating bag.   ROS: Patient denies fever, rash, sore throat, blurred vision, nausea, vomiting, diarrhea, cough, shortness of breath or chest pain,   headache, or mood change.     Objective:   No results found. Recent Labs    12/29/18 1442  WBC 9.2  HGB 9.1*  HCT 27.0*  PLT 363   Recent Labs    12/28/18 0558 12/30/18 0518  NA 136 137  K 3.8 4.0  CL 102 103  CO2 23 26  GLUCOSE 80 88  BUN 30* 35*  CREATININE 1.22 1.15  CALCIUM 8.9 8.9    Intake/Output Summary (Last 24 hours) at 12/30/2018 9147 Last data filed at 12/30/2018 0730 Gross per 24 hour  Intake 1000 ml  Output 2125 ml  Net -1125 ml     Physical Exam: Vital Signs Blood pressure (!) 135/58, pulse (!) 55, temperature 98.7 F (37.1 C), resp. rate 18, weight 84.2 kg, SpO2 99 %. Constitutional: No distress . Vital signs reviewed. Awake, alert, more appropriate, more clear, nursing in room; sitting up in bed, NAD HEENT: EOMI, oral membranes moist Neck: supple Cardiovascular: RRR without murmur. No JVD    Respiratory: CTA Bilaterally without wheezes or rales. Normal effort    GI: BS +, non-tender, non-distended  Ext: no edema Skin: Right BKA well-healed . Left BKA  with wound VAC off.    . Appropriately tender. Groin bruise noted Neuro: alert and appropriate. Reasonable insight and awareness. Normal cn exam Musculoskeletal: 4/5 in upper extremities bilaterally. RLE 4/5. LLE limited by pain  Psych:pleasant    Assessment/Plan: 1. Functional deficits secondary to left BKA which require 3+ hours per day of interdisciplinary therapy in a comprehensive inpatient rehab setting.  Physiatrist is providing close team supervision and 24 hour management of active medical problems listed below.  Physiatrist and rehab  team continue to assess barriers to discharge/monitor patient progress toward functional and medical goals  Care Tool:  Bathing    Body parts bathed by patient: Right arm, Left arm, Chest, Abdomen, Face, Left upper leg, Right upper leg, Front perineal area   Body parts bathed by helper: Buttocks, Right upper leg, Left upper leg Body parts n/a: Left lower leg, Right lower leg   Bathing assist Assist Level: 2 Helpers(sit to stand in stedy for peri hygiene/buttocks)     Upper Body Dressing/Undressing Upper body dressing   What is the patient wearing?: Pull over shirt    Upper body assist Assist Level: Supervision/Verbal cueing    Lower Body Dressing/Undressing Lower body dressing      What is the patient wearing?: Incontinence brief     Lower body assist Assist for lower body dressing: Total Assistance - Patient < 25%     Toileting Toileting    Toileting assist Assist for toileting: 2 Helpers     Transfers Chair/bed transfer  Transfers assist     Chair/bed transfer assist level: Moderate Assistance - Patient 50 - 74% Chair/bed transfer assistive device: Sliding board, Prosthesis   Locomotion Ambulation   Ambulation assist   Ambulation activity did not occur: Safety/medical concerns(B BKA with R prosthesis)          Walk 10 feet activity   Assist  Walk 10 feet activity did not occur: Safety/medical concerns        Walk 50  feet activity   Assist Walk 50 feet with 2 turns activity did not occur: Safety/medical concerns         Walk 150 feet activity   Assist Walk 150 feet activity did not occur: Safety/medical concerns         Walk 10 feet on uneven surface  activity   Assist Walk 10 feet on uneven surfaces activity did not occur: Safety/medical concerns         Wheelchair     Assist Will patient use wheelchair at discharge?: Yes Type of Wheelchair: Manual    Wheelchair assist level: Supervision/Verbal cueing Max  wheelchair distance: 105'    Wheelchair 50 feet with 2 turns activity    Assist        Assist Level: Supervision/Verbal cueing   Wheelchair 150 feet activity     Assist      Assist Level: Supervision/Verbal cueing   Blood pressure (!) 135/58, pulse (!) 55, temperature 98.7 F (37.1 C), resp. rate 18, weight 84.2 kg, SpO2 99 %.  Medical Problem List and Plan: 1.  Impaired mobility and ADLs secondary to dehiscence of left amputation stump, left BKA             -patient may shower, but incision should be covered             -ELOS/Goals: MinA in PT, OT, I in SLP  Patient is beginning CIR therapies today including PT and OT  2.  Antithrombotics: -DVT/anticoagulation:  Pharmaceutical: Other (comment)--Apixaban             -antiplatelet therapy: ASA/Plavix 3. Pain Management: pt with increased stump pain yesterday  -tramadol for moderate pain  -oxycodone 2.5-5mg  for more severe pain  -reviewed massage and sensory feedback 4. Mood: LCSW to follow for evaluation and support.              -antipsychotic agents: N/A 5. Neuropsych: This patient is capable of making decisions on hs own behalf. 6. Skin/Wound Care: Continue wound VAC for 7 total days. On Vitamin C and will add prostat to promote wound healing.  12/14- surgery 12/4, so should be able to remove wound VAC- will do so today- placed order   12/15- wound VAC off.  7. Fluids/Electrolytes/Nutrition:  Encourage PO   12/12-BUN sl elevate push fluids  -recheck monday 8.  Diastolic CHF: Monitor for signs of overload--abdominal girth has decreased.   Continue Lasix and heart healthy diet.   -daily weights Filed Weights   12/26/18 0532 12/28/18 0340 12/30/18 0500  Weight: 86.3 kg 86.3 kg 84.2 kg    9. BPH with urinary retention: On Proscar and Flomax. History of UTIs in past 3 months   12/13-pvr's 300-600cc   -OOB to toilet/ BSC to help with emptying.  Encourage Double voids, extra time    -no urecholine d/t cv hx,     -ua negative, urine without odor    -pt already on max flomax dose  12/14- will just touch base with Urology to see if they have any other ideas? They suggested a foley and have him f/u- talked with son Treyvone Chelf about it. They decided want a foley. Will see if pt wants foley now, or wait til d/c.  12/15- placing Foley per d/w family (son) AND patient. Til Urology f/u.  12/16- blood tinged urine noted- asked nursing to flush foley/irrigate bladder; BUN is up to 35 from 30- will need a bolus of 500cc IVFs x1. Since cannot do a lot of  fluids due to diastolic CHF. 10. PAF: Monitor HR tid--on Eliquis and atrovastatin 11. ABLA: Continue iron supplement.  12. Constipation: Had small BM 12/11added Senna to colace.    -bm this am 12/12  12/15- LBM 12/13- large 13. T2DM: Will monitor BS ac/hs. On Glipizide 5 mg bid--use SSI prn.     CBG (last 3)  Recent Labs    12/29/18 1617 12/29/18 2111 12/30/18 0619  GLUCAP 136* 167* 79      12/13-fair control so far. Monitor for pattern  12/14- a few lower, but not low BGs- con't meds 14. HLD: Lipitor 40mg  QD    LOS: 5 days A FACE TO FACE EVALUATION WAS PERFORMED  Cassell Voorhies 12/30/2018, 8:32 AM

## 2018-12-30 NOTE — Progress Notes (Signed)
Occupational Therapy Session Note  Patient Details  Name: [name] MRN: [id number] Date of Birth: 31-Dec-1927  Today's Date: 12/30/2018 OT Individual Time: 1000-1055 OT Individual Time Calculation (min): 55 min    Short Term Goals: Week 1:  OT Short Term Goal 1 (Week 1): Pt wil transfer to [facility] with LRAD and MAX A of 1 to decrease burden of care OT Short Term Goal 2 (Week 1): Pt will sit EOB with S for 5 min to demo improved sitting balance OT Short Term Goal 3 (Week 1): Pt will complete lateral leans with MOD A in prep for toileting  Skilled Therapeutic Interventions/Progress Updates:    Pt resting in w/c upon arrival with c/o increased pain in LLE. Pt requested use of BSC.  W/c<>drop arm BSC with max A +2 for safety to hold sliding board during transfer.  Pt dependent for toileting tasks. Pt performed w/c>bed transfer with mod A. Pt able to assist with repositioning in bed.  Pt remained in bed with all needs within reach and RN present.   Therapy Documentation Precautions:  Precautions Precautions: Fall Precaution Comments: prior R transtibial amputation; wound VAC to L residual limb Required Braces or Orthoses: Other Brace Other Brace: Limbguard LLE Restrictions Weight Bearing Restrictions: Yes LLE Weight Bearing: Non weight bearing Other Position/Activity Restrictions: R BK amp is healed; Prosthesis is new; he is on a wearing/breaking in schedule now  Pain:  Pt c/o 6/10 pain in LLE; RN notified and meds admin, repositioned    Therapy/Group: Individual Therapy  [name] 12/30/2018, 11:03 AM

## 2018-12-30 NOTE — Progress Notes (Addendum)
Physical Therapy Session Note  Patient Details  Name: [name] MRN: [id number] Date of Birth: 03-12-1927  Today's Date: 12/30/2018 PT Individual Time: 1300-1418 PT Individual Time Calculation (min): 78 min   Short Term Goals: Week 1:  PT Short Term Goal 1 (Week 1): Pt will be able to demonstrate functional dynamic sitting balance with CGA. PT Short Term Goal 2 (Week 1): Pt will be able to perform bed <> chair transfers with mod assist.  Skilled Therapeutic Interventions/Progress Updates:   Pt resting in bed. He rated pain L residual limb 5/10; premedicated.  Therapeutic exercises performed with LEs to increase strength for functional mobility: supine_ 10 x 1 cervical flexion for abdominal activation, R/L hip extensions over towel roll, R short arc quad knee extensions, bil hip adductor squeeze.  Rolling R with use of rail, flat bed, supervision.  In R side lying: 10 x 1 bil hip flexion.  R side lying> sitting with max assist to get R elbow under him, and push trunk up.  (Pt is unable to extend shoulder to bring elbow and hand under him.) In sitting EOB, (with R prosthesis donned) 5 x 1 R/L lateral leans.  PT doffed limb protectors before rolling; doffed R shrinker sock in sitting.  PT educated pt on donning sleeve and sock requiring mod assist, max assist to don R prosthesis.       Slide board transfer to the L, min assist to initiate, TCs and VCs for technique. Once in w/c, pt donned L limb protector with max cues and min assistance.  Pt managed R footrest with tactile cues, L amputee pad max assist.  W/c propulsion using bil UEs and R prosthesis, x 50' with cues for technique, straight path.   Sit> stand at railings of steps, +2 assist.  Pt unable to bring weight over RLE and straighten knee and hip.   At end of session, pt seated in w/c with L limb protector on, R prosthesis, seat belt alarm set and needs at hand.  PT requested [place], RN doff prosthesis at 1600,  = 2.5 hours  wearing time.   Therapy Documentation Precautions:  Precautions Precautions: Fall Precaution Comments: prior R transtibial amputation; wound VAC to L residual limb Required Braces or Orthoses: Other Brace Other Brace: Limbguard LLE Restrictions Weight Bearing Restrictions: Yes LLE Weight Bearing: Non weight bearing Other Position/Activity Restrictions: R BK amp is healed; Prosthesis is new; he is on a wearing/breaking in schedule now        Therapy/Group: Individual Therapy  Xavia Kniskern 12/30/2018, 4:50 PM

## 2018-12-30 NOTE — Progress Notes (Signed)
Physical Therapy Session Note  Patient Details  Name: [name] MRN: [id number] Date of Birth: 08/30/1927  Today's Date: 12/30/2018 PT Individual Time: 0800-0900 PT Individual Time Calculation (min): 60 min   Short Term Goals: Week 1:  PT Short Term Goal 1 (Week 1): Pt will be able to demonstrate functional dynamic sitting balance with CGA. PT Short Term Goal 2 (Week 1): Pt will be able to perform bed <> chair transfers with mod assist.  Skilled Therapeutic Interventions/Progress Updates:     Patient in bed upon PT arrival. Patient alert and agreeable to PT session. Patient denied pain, however, reported numbness below his residual limb describing phantom pain. PT provided education on phantom pain and desensitization techniques to relieve pain/numbness.   Therapeutic Activity: Bed Mobility: Patient performed supine to sit with mod-min A with HOB elevated ~70 degrees and use of bed rail. Provided verbal cues for sliding LEs off the bed then rolling to his L side and pushing to sit up. Patient sat EOB and donned L limb guard in sitting with max A and R liner and prosthesis with total A. PT educated on proper technique for donning liner and prosthesis and wear schedule (2.5 hours x2 today). Patient requires manual facilitation at his knee to push into his socket with his foot on the floor, 10 clicks with 1-3 ply sock.  Transfers: Patient performed level slide board transfer bed>w/c to the L wearing his R prosthsis with mod A and increased time to complete task. Provided verbal cues for board placement with total A, head-hips relationship, pushing through his R prosthesis to lift his hips, hand placement, and manual facilitation for R foot placement. He performed sit to/from stand x3 with mod A +2 pulling up on the outside // bar with B UEs, remained standing 10-20 seconds each trial. Provided verbal cues and manual facilitation for forward and R weight shift over his prosthesis, pushing up to  stand, and bringing hips forward and chest up in standing for erect posture.  Wheelchair Mobility:  Patient was transported in the w/c with total A throughout session for energy conservation and time management.  Therapeutic Exercise: Patient performed the following exercises with verbal and tactile cues for proper technique. -B quad sets x10 -B glut sets x10 -B SAQ x10 -B SLR x10 with tactile cues for knee extension -B LAQ seated EOB x10 -R LAQ with prosthesis x10  Patient in w/c in room at end of session with breaks locked, seat belt alarm set, and all needs within reach.    Therapy Documentation Precautions:  Precautions Precautions: Fall Precaution Comments: prior R transtibial amputation; wound VAC to L residual limb Required Braces or Orthoses: Other Brace Other Brace: Limbguard LLE Restrictions Weight Bearing Restrictions: Yes LLE Weight Bearing: Non weight bearing Other Position/Activity Restrictions: R BK amp is healed; Prosthesis is new; he is on a wearing/breaking in schedule now    Therapy/Group: Individual Therapy  Kenzlee Fishburn L Solon Alban PT, DPT  12/30/2018, 3:39 PM

## 2018-12-31 ENCOUNTER — Inpatient Hospital Stay (HOSPITAL_COMMUNITY): Payer: Medicare Other

## 2018-12-31 ENCOUNTER — Inpatient Hospital Stay (HOSPITAL_COMMUNITY): Payer: Medicare Other | Admitting: Occupational Therapy

## 2018-12-31 ENCOUNTER — Inpatient Hospital Stay (HOSPITAL_COMMUNITY): Payer: Medicare Other | Admitting: *Deleted

## 2018-12-31 LAB — GLUCOSE, CAPILLARY
Glucose-Capillary: 74 mg/dL (ref 70–99)
Glucose-Capillary: 96 mg/dL (ref 70–99)
Glucose-Capillary: 114 mg/dL — ABNORMAL HIGH (ref 70–99)
Glucose-Capillary: 141 mg/dL — ABNORMAL HIGH (ref 70–99)

## 2018-12-31 NOTE — Progress Notes (Signed)
[facility] PHYSICAL MEDICINE & REHABILITATION PROGRESS NOTE   Subjective/Complaints:  No more hematuria. Complains of some pain in residual limb, otherwise without complains. As per therapy and nurse, foley was leaking. Nurse flushed foley and found large clot; foley is now working well.   ROS: Patient denies fever, rash, sore throat, blurred vision, nausea, vomiting, diarrhea, cough, shortness of breath or chest pain,   headache, or mood change.     Objective:   No results found. Recent Labs    12/29/18 1442  WBC 9.2  HGB 9.1*  HCT 27.0*  PLT 363   Recent Labs    12/30/18 0518  NA 137  K 4.0  CL 103  CO2 26  GLUCOSE 88  BUN 35*  CREATININE 1.15  CALCIUM 8.9    Intake/Output Summary (Last 24 hours) at 12/31/2018 1117 Last data filed at 12/31/2018 0729 Gross per 24 hour  Intake 840 ml  Output 2030 ml  Net -1190 ml     Physical Exam: Vital Signs Blood pressure (!) 129/55, pulse (!) 55, temperature 98 F (36.7 C), resp. rate 18, weight 80.3 kg, SpO2 98 %. Constitutional: No distress . Vital signs reviewed. Awake, alert, more appropriate, more clear, nursing in room; sitting up in bed, NAD HEENT: EOMI, oral membranes moist Neck: supple Cardiovascular: RRR without murmur. No JVD    Respiratory: CTA Bilaterally without wheezes or rales. Normal effort    GI: BS +, non-tender, non-distended  Ext: no edema Skin: Right BKA well-healed . Left BKA  with wound VAC off.  Appropriately tender. Groin bruise noted Neuro: alert and appropriate. Reasonable insight and awareness. Normal cn exam Musculoskeletal: 4/5 in upper extremities bilaterally. RLE 4/5. LLE limited by pain  Psych:pleasant    Assessment/Plan: 1. Functional deficits secondary to left BKA which require 3+ hours per day of interdisciplinary therapy in a comprehensive inpatient rehab setting.  Physiatrist is providing close team supervision and 24 hour management of active medical problems listed  below.  Physiatrist and rehab team continue to assess barriers to discharge/monitor patient progress toward functional and medical goals  Care Tool:  Bathing    Body parts bathed by patient: Right arm, Left arm, Chest, Abdomen, Face, Left upper leg, Right upper leg, Front perineal area   Body parts bathed by helper: Buttocks, Right upper leg, Left upper leg Body parts n/a: Left lower leg, Right lower leg   Bathing assist Assist Level: 2 Helpers(sit to stand in stedy for peri hygiene/buttocks)     Upper Body Dressing/Undressing Upper body dressing   What is the patient wearing?: Pull over shirt    Upper body assist Assist Level: Supervision/Verbal cueing    Lower Body Dressing/Undressing Lower body dressing      What is the patient wearing?: Incontinence brief     Lower body assist Assist for lower body dressing: Total Assistance - Patient < 25%     Toileting Toileting    Toileting assist Assist for toileting: 2 Helpers     Transfers Chair/bed transfer  Transfers assist     Chair/bed transfer assist level: Moderate Assistance - Patient 50 - 74% Chair/bed transfer assistive device: Sliding board, Prosthesis   Locomotion Ambulation   Ambulation assist   Ambulation activity did not occur: Safety/medical concerns(B BKA with R prosthesis)          Walk 10 feet activity   Assist  Walk 10 feet activity did not occur: Safety/medical concerns        Walk 50 feet  activity   Assist Walk 50 feet with 2 turns activity did not occur: Safety/medical concerns         Walk 150 feet activity   Assist Walk 150 feet activity did not occur: Safety/medical concerns         Walk 10 feet on uneven surface  activity   Assist Walk 10 feet on uneven surfaces activity did not occur: Safety/medical concerns         Wheelchair     Assist Will patient use wheelchair at discharge?: Yes Type of Wheelchair: Manual    Wheelchair assist level:  Supervision/Verbal cueing Max wheelchair distance: 105'    Wheelchair 50 feet with 2 turns activity    Assist        Assist Level: Supervision/Verbal cueing   Wheelchair 150 feet activity     Assist      Assist Level: Supervision/Verbal cueing   Blood pressure (!) 129/55, pulse (!) 55, temperature 98 F (36.7 C), resp. rate 18, weight 80.3 kg, SpO2 98 %.  Medical Problem List and Plan: 1.  Impaired mobility and ADLs secondary to dehiscence of left amputation stump, left BKA             -patient may shower, but incision should be covered             -ELOS/Goals: MinA in PT, OT, I in SLP  Patient is beginning CIR therapies today including PT and OT  2.  Antithrombotics: -DVT/anticoagulation:  Pharmaceutical: Other (comment)--Apixaban             -antiplatelet therapy: ASA/Plavix 3. Pain Management: pt with increased stump pain yesterday  -tramadol for moderate pain  -oxycodone 2.5-5mg  for more severe pain  -reviewed massage and sensory feedback 4. Mood: LCSW to follow for evaluation and support.              -antipsychotic agents: N/A 5. Neuropsych: This patient is capable of making decisions on hs own behalf. 6. Skin/Wound Care: Continue wound VAC for 7 total days. On Vitamin C and will add prostat to promote wound healing.  12/14- surgery 12/4, so should be able to remove wound VAC- will do so today- placed order   12/15- wound VAC off.  7. Fluids/Electrolytes/Nutrition:  Encourage PO   12/12-BUN sl elevate push fluids  -recheck monday 8.  Diastolic CHF: Monitor for signs of overload--abdominal girth has decreased.   Continue Lasix and heart healthy diet.   -daily weights Filed Weights   12/28/18 0340 12/30/18 0500 12/31/18 0421  Weight: 86.3 kg 84.2 kg 80.3 kg    9. BPH with urinary retention: On Proscar and Flomax. History of UTIs in past 3 months   12/13-pvr's 300-600cc   -OOB to toilet/ BSC to help with emptying.  Encourage Double voids, extra time     -no urecholine d/t cv hx,    -ua negative, urine without odor    -pt already on max flomax dose  12/14- will just touch base with Urology to see if they have any other ideas? They suggested a foley and have him f/u- talked with son Isom Kochan about it. They decided want a foley. Will see if pt wants foley now, or wait til d/c.  12/15- placing Foley per d/w family (son) AND patient. Til Urology f/u.  12/16- blood tinged urine noted- asked nursing to flush foley/irrigate bladder; BUN is up to 35 from 30- will need a bolus of 500cc IVFs x1. Since cannot do a lot of fluids  due to diastolic CHF.  12/17- foley leaking, nurse found large clot after flushing foley, foley now working well.  10. PAF: Monitor HR tid--on Eliquis and atrovastatin 11. ABLA: Continue iron supplement.  12. Constipation: Had small BM 12/11added Senna to colace.    -bm this am 12/12  12/15- LBM 12/13- large 13. T2DM: Will monitor BS ac/hs. On Glipizide 5 mg bid--use SSI prn.     CBG (last 3)  Recent Labs    12/30/18 1654 12/30/18 2108 12/31/18 0609  GLUCAP 149* 150* 74    12/13-fair control so far. Monitor for pattern  12/14- a few lower, but not low BGs- con't meds  12/17: BG 74 this morning, well controlled 14. HLD: Lipitor 40mg  QD    LOS: 6 days A FACE TO FACE EVALUATION WAS PERFORMED  Falyn Rubel P Vraj Denardo 12/31/2018, 11:17 AM

## 2018-12-31 NOTE — Progress Notes (Signed)
Occupational Therapy Session Note  Patient Details  Name: [name] MRN: [id number] Date of Birth: [birth year]  Today's Date: 12/31/2018 OT Individual Time: 1000-1100 OT Individual Time Calculation (min): 60 min    Short Term Goals: Week 1:  OT Short Term Goal 1 (Week 1): Pt wil transfer to [facility] with LRAD and MAX A of 1 to decrease burden of care OT Short Term Goal 2 (Week 1): Pt will sit EOB with S for 5 min to demo improved sitting balance OT Short Term Goal 3 (Week 1): Pt will complete lateral leans with MOD A in prep for toileting  Skilled Therapeutic Interventions/Progress Updates:    Pt resting in bed upon arrival.  Pt agreeable to getting OOB.  Pt rolled in bed to facilitate donning shorts prior to getting OOB. Pt requires min A for rolling R<>L.  Pt requires mod A for supine>sit EOB with bed rails.  Slide board transfer to w/c with mod A+2 for steadying w/c and board. Pt completed UB bathing/dressing tasks seated in w/c at sink.  Pt requires more than a reasonable amount of time to complete tasks. Pt remained in w/c with all needs within reach and belt alarm activated.   Therapy Documentation Precautions:  Precautions Precautions: Fall Precaution Comments: prior R transtibial amputation; wound VAC to L residual limb Required Braces or Orthoses: Other Brace Other Brace: Limbguard LLE Restrictions Weight Bearing Restrictions: Yes LLE Weight Bearing: Non weight bearing Other Position/Activity Restrictions: R BK amp is healed; Prosthesis is new; he is on a wearing/breaking in schedule now  Pain:  Pt denies pain this morning   Therapy/Group: Individual Therapy  [name] 12/31/2018, 11:04 AM

## 2018-12-31 NOTE — Progress Notes (Signed)
Physical Therapy Weekly Progress Note  Patient Details  Name: [name] MRN: [id number] Date of Birth: 06-03-1927  Beginning of progress report period: December 26, 2018 End of progress report period: December 31, 2018  Today's Date: 12/31/2018 PT Individual Time: 0800-0900 PT Individual Time Calculation (min): 60 min   Patient has met 2 of 2 short term goals.  He is progressing well with functional mobility using his prosthesis this week. He currently requires max-mod A supine to sit, min A rolling and sit to supine, CGA-supervision sitting balance, mod-max A level slide board transfers with R prosthesis, mod +2 sit<>stand pulling up on // bar, and supervision w/c propulsion >100 feet. Started a schedule for wearing prosthesis to build tolerance, on 12/15 started wearing 2 hours 2x per day, increasing time +30 min each day. At 3 hours x2 today.   Patient continues to demonstrate the following deficits muscle weakness and muscle joint tightness, decreased cardiorespiratoy endurance, decreased problem solving and decreased memory and decreased sitting balance, decreased standing balance, decreased postural control, decreased balance strategies and difficulty maintaining precautions and therefore will continue to benefit from skilled PT intervention to increase functional independence with mobility.  Patient progressing toward long term goals..  Continue plan of care.  PT Short Term Goals Week 1:  PT Short Term Goal 1 (Week 1): Pt will be able to demonstrate functional dynamic sitting balance with CGA. PT Short Term Goal 2 (Week 1): Pt will be able to perform bed <> chair transfers with mod assist. Week 2:     Skilled Therapeutic Interventions/Progress Updates:     Patient in bed upon PT arrival. Patient alert and agreeable to PT session. Patient and RN reported that his catheter was leaking and patient had a urinal placed to maintain skin integrity. Awaiting MD assessment to determine  placement for a new catheter. Limited session to bed level exercise/activity per RN.   Therapeutic Exercise: Patient performed the following exercises with verbal and tactile cues for proper technique. -B quad sets x10 with 5 sec hold -B SLR with AAROM x10  -B glut sets x10 with 5 sec hold -B hip and knee flexion with AAROM x5 with 10 sec hold  Manual Therapy: -Grade 3/4 A/P mobilizations at the L femur with towel providing stabilization at the tibia 2x45 sec for promotion of knee extension  -Grade 3/4 A/P mobilizations at the R tibia with towel providing stabilization at the femur 2x45 sec for promotion of knee flexion -B supine hamstring stretch with muscle energy  Therapeutic Activity: PT washed patient's liner for his prosthesis and educated patient on basic limb and prosthesis hygiene.  PT donned R prosthesis with patient in supine with max A for donning liner and socket and for donning L limb guard. Educated patient on schedule for wearing limb guard and prosthesis per CPO and placed sign above patient's bed.   Bed Mobility: Patient performed scooting up in bed using R prosthesis and UEs with mod A with cues and manual facilitation for foot placement.   Patient in bed at end of session with breaks locked, bed alarm set, and all needs within reach.    Therapy Documentation Precautions:  Precautions Precautions: Fall Precaution Comments: prior R transtibial amputation; wound VAC to L residual limb Required Braces or Orthoses: Other Brace Other Brace: Limbguard LLE Restrictions Weight Bearing Restrictions: Yes LLE Weight Bearing: Non weight bearing Other Position/Activity Restrictions: R BK amp is healed; Prosthesis is new; he is on a wearing/breaking in schedule now  Therapy/Group: Individual Therapy  Gilliam Hawkes L Kinzley Savell PT, DPT  12/31/2018, 12:21 PM

## 2018-12-31 NOTE — Progress Notes (Signed)
Occupational Therapy Session Note  Patient Details  Name: [name] MRN: [id number] Date of Birth: 18-Mar-1927  Today's Date: 12/31/2018 OT Individual Time: 1330-1415 OT Individual Time Calculation (min): 45 min    Short Term Goals: Week 1:  OT Short Term Goal 1 (Week 1): Pt wil transfer to [facility] with LRAD and MAX A of 1 to decrease burden of care OT Short Term Goal 2 (Week 1): Pt will sit EOB with S for 5 min to demo improved sitting balance OT Short Term Goal 3 (Week 1): Pt will complete lateral leans with MOD A in prep for toileting  Skilled Therapeutic Interventions/Progress Updates:    1:1 Pt received in w/c and transported to the gym. With prosthesis doffed pt performed A/P transfer onto the mat with focus on maintaining a forward weight shift and then being able to weight shift laterally while reciprocal scooting on to the mat. Pt able to perform with mod A with +2 to help maintaining forward weight shift to maintain sitting balance intermittently. In long sitting performed knee extension bilaterally ; 5 reps of 10 seconds with holding extension position. Pt performed posterior transfer with mod A back into w/c. Donned Prosthesis with max A. Pt propelled w/c ~50 feet at a slow speed. Performed slide board transfer into recliner with mod A (downhill) with  +2 to stabilize equipment. Left pt resting with bilateral LEs elevated.    Therapy Documentation Precautions:  Precautions Precautions: Fall Precaution Comments: prior R transtibial amputation; wound VAC to L residual limb Required Braces or Orthoses: Other Brace Other Brace: Limbguard LLE Restrictions Weight Bearing Restrictions: Yes LLE Weight Bearing: Non weight bearing Other Position/Activity Restrictions: R BK amp is healed; Prosthesis is new; he is on a wearing/breaking in schedule now General:   Vital Signs: Therapy Vitals Temp: 98 F (36.7 C) Pulse Rate: 60 Resp: 16 BP: 130/60 Patient Position (if appropriate):  Sitting Oxygen Therapy SpO2: 99 % O2 Device: Room Air Pain: No c/o pain   Therapy/Group: Individual Therapy  [name] [facility] 12/31/2018, 3:42 PM

## 2018-12-31 NOTE — Progress Notes (Signed)
PA advised to have Foley Cath changed. New foley has been place for patient. Urine is flowing. Pt declines and c/o pain at this moment.

## 2018-12-31 NOTE — Progress Notes (Signed)
Pt's foley was irrigated this morning. Met no resistance. No urine returned after irrigation. A dime size clot was found plunging port. Foley began to drain urine. Pt declines any pain at this time. Will continue to monitor. 

## 2019-01-01 ENCOUNTER — Inpatient Hospital Stay (HOSPITAL_COMMUNITY): Payer: Medicare Other

## 2019-01-01 ENCOUNTER — Inpatient Hospital Stay (HOSPITAL_COMMUNITY): Payer: Medicare Other | Admitting: Occupational Therapy

## 2019-01-01 ENCOUNTER — Inpatient Hospital Stay (HOSPITAL_COMMUNITY): Payer: Medicare Other | Admitting: *Deleted

## 2019-01-01 LAB — GLUCOSE, CAPILLARY
Glucose-Capillary: 83 mg/dL (ref 70–99)
Glucose-Capillary: 108 mg/dL — ABNORMAL HIGH (ref 70–99)
Glucose-Capillary: 136 mg/dL — ABNORMAL HIGH (ref 70–99)
Glucose-Capillary: 120 mg/dL — ABNORMAL HIGH (ref 70–99)

## 2019-01-01 MED ORDER — FUROSEMIDE 20 MG PO TABS
20.0000 mg | ORAL_TABLET | Freq: Once | ORAL | Status: AC
  Administered 2019-01-01: 20 mg via ORAL
  Filled 2019-01-01: qty 1

## 2019-01-01 NOTE — Progress Notes (Signed)
Occupational Therapy Session Note  Patient Details  Name: LANGDON CROSSON MRN: [id number] Date of Birth: 06/05/1927  Today's Date: 01/01/2019 OT Individual Time: 1000-1100 OT Individual Time Calculation (min): 60 min   Short Term Goals: Week 2:  OT Short Term Goal 1 (Week 2): STG=LTG secondary to ELOS  Skilled Therapeutic Interventions/Progress Updates:    Pt greeted seated in wc and agreeable to OT treatment session. Pt declined bathing/dressing this morning. Pt brought to therapy gym and completed slideboard transfer with mod  +2 to the therapy mat. UB there-ex and sitting balance on mat. Ball toss with chest press to push ball back. Had pt find center of balance after each hit. 3 sets of 20. 1lb dowel rod 3 sets of 10 bicep press, chest press, and straight arm raises.OT placed wedge under bottom to improve anterior weight shift. Mod +2 slideboard back to wc. Worked on wc mobility with obstacle course around large cones. Pt returned to room and requested to remain in wc. Alarm belt on and call bell in reach.   Therapy Documentation Precautions:  Precautions Precautions: Fall Precaution Comments: prior R transtibial amputation; wound VAC to L residual limb Required Braces or Orthoses: Other Brace Other Brace: Limbguard LLE Restrictions Weight Bearing Restrictions: Yes LLE Weight Bearing: Non weight bearing Other Position/Activity Restrictions: R BK amp is healed; Prosthesis is new; he is on a wearing/breaking in schedule now Pain: Pain Assessment Pain Scale: 0-10 Pain Score: 0-No pain   Therapy/Group: Individual Therapy  Valma Cava 01/01/2019, 10:37 AM

## 2019-01-01 NOTE — Progress Notes (Signed)
[place] PHYSICAL MEDICINE & REHABILITATION PROGRESS NOTE   Subjective/Complaints: No more hematuria. Pain is well-controlled this morning. Foley working well this morning.   ROS: Patient denies fever, rash, sore throat, blurred vision, nausea, vomiting, diarrhea, cough, shortness of breath or chest pain,   headache, or mood change.     Objective:   No results found. Recent Labs    12/29/18 1442  WBC 9.2  HGB 9.1*  HCT 27.0*  PLT 363   Recent Labs    12/30/18 0518  NA 137  K 4.0  CL 103  CO2 26  GLUCOSE 88  BUN 35*  CREATININE 1.15  CALCIUM 8.9    Intake/Output Summary (Last 24 hours) at 01/01/2019 0928 Last data filed at 01/01/2019 0600 Gross per 24 hour  Intake 457 ml  Output 3650 ml  Net -3193 ml     Physical Exam: Vital Signs Blood pressure 139/67, pulse (!) 52, temperature 98.3 F (36.8 C), resp. rate 19, weight 86.3 kg, SpO2 97 %. Constitutional: No distress . Vital signs reviewed. Awake, alert, more appropriate, more clear, nursing in room; sitting up in bed, NAD HEENT: EOMI, oral membranes moist Neck: supple Cardiovascular: RRR without murmur. No JVD    Respiratory: CTA Bilaterally without wheezes or rales. Normal effort    GI: BS +, non-tender, non-distended  Ext: no edema Skin: Right BKA well-healed . Left BKA  with wound VAC off.  Appropriately tender. Groin bruise noted Neuro: alert and appropriate. Reasonable insight and awareness. Normal cn exam Musculoskeletal: 4/5 in upper extremities bilaterally. RLE 4/5. LLE limited by pain  Psych:pleasant    Assessment/Plan: 1. Functional deficits secondary to left BKA which require 3+ hours per day of interdisciplinary therapy in a comprehensive inpatient rehab setting.  Physiatrist is providing close team supervision and 24 hour management of active medical problems listed below.  Physiatrist and rehab team continue to assess barriers to discharge/monitor patient progress toward functional  and medical goals  Care Tool:  Bathing    Body parts bathed by patient: Right arm, Left arm, Chest, Abdomen, Face, Left upper leg, Right upper leg, Front perineal area   Body parts bathed by helper: Buttocks, Right upper leg, Left upper leg Body parts n/a: Left lower leg, Right lower leg   Bathing assist Assist Level: 2 Helpers(sit to stand in stedy for peri hygiene/buttocks)     Upper Body Dressing/Undressing Upper body dressing   What is the patient wearing?: Pull over shirt    Upper body assist Assist Level: Supervision/Verbal cueing    Lower Body Dressing/Undressing Lower body dressing      What is the patient wearing?: Incontinence brief     Lower body assist Assist for lower body dressing: Total Assistance - Patient < 25%     Toileting Toileting    Toileting assist Assist for toileting: 2 Helpers     Transfers Chair/bed transfer  Transfers assist     Chair/bed transfer assist level: Moderate Assistance - Patient 50 - 74% Chair/bed transfer assistive device: Sliding board, Prosthesis   Locomotion Ambulation   Ambulation assist   Ambulation activity did not occur: Safety/medical concerns(B BKA with R prosthesis)          Walk 10 feet activity   Assist  Walk 10 feet activity did not occur: Safety/medical concerns        Walk 50 feet activity   Assist Walk 50 feet with 2 turns activity did not occur: Safety/medical concerns  Walk 150 feet activity   Assist Walk 150 feet activity did not occur: Safety/medical concerns         Walk 10 feet on uneven surface  activity   Assist Walk 10 feet on uneven surfaces activity did not occur: Safety/medical concerns         Wheelchair     Assist Will patient use wheelchair at discharge?: Yes Type of Wheelchair: Manual    Wheelchair assist level: Supervision/Verbal cueing Max wheelchair distance: 105'    Wheelchair 50 feet with 2 turns activity    Assist         Assist Level: Supervision/Verbal cueing   Wheelchair 150 feet activity     Assist      Assist Level: Supervision/Verbal cueing   Blood pressure 139/67, pulse (!) 52, temperature 98.3 F (36.8 C), resp. rate 19, weight 86.3 kg, SpO2 97 %.  Medical Problem List and Plan: 1.  Impaired mobility and ADLs secondary to dehiscence of left amputation stump, left BKA             -patient may shower, but incision should be covered             -ELOS/Goals: MinA in PT, OT, I in SLP  Patient is beginning CIR therapies today including PT and OT  2.  Antithrombotics: -DVT/anticoagulation:  Pharmaceutical: Other (comment)--Apixaban             -antiplatelet therapy: ASA/Plavix 3. Pain Management: pt with increased stump pain yesterday  -tramadol for moderate pain  -oxycodone 2.5-5mg  for more severe pain  -reviewed massage and sensory feedback 4. Mood: LCSW to follow for evaluation and support.              -antipsychotic agents: N/A 5. Neuropsych: This patient is capable of making decisions on hs own behalf. 6. Skin/Wound Care: Continue wound VAC for 7 total days. On Vitamin C and will add prostat to promote wound healing.  12/14- surgery 12/4, so should be able to remove wound VAC- will do so today- placed order   12/15- wound VAC off.  7. Fluids/Electrolytes/Nutrition:  Encourage PO   12/12-BUN sl elevate push fluids  -recheck monday 8.  Diastolic CHF: Monitor for signs of overload--abdominal girth has decreased.   Continue Lasix and heart healthy diet.   -daily weights Filed Weights   12/30/18 0500 12/31/18 0421 01/01/19 0500  Weight: 84.2 kg 80.3 kg 86.3 kg    12/18: Weight increase from 80.3 to 86.3. Will add additional 20mg  of Lasix today.  9. BPH with urinary retention: On Proscar and Flomax. History of UTIs in past 3 months   12/13-pvr's 300-600cc   -OOB to toilet/ BSC to help with emptying.  Encourage Double voids, extra time    -no urecholine d/t cv hx,    -ua negative,  urine without odor    -pt already on max flomax dose  12/14- will just touch base with Urology to see if they have any other ideas? They suggested a foley and have him f/u- talked with son Lum Stillinger about it. They decided want a foley. Will see if pt wants foley now, or wait til d/c.  12/15- placing Foley per d/w family (son) AND patient. Til Urology f/u.  12/16- blood tinged urine noted- asked nursing to flush foley/irrigate bladder; BUN is up to 35 from 30- will need a bolus of 500cc IVFs x1. Since cannot do a lot of fluids due to diastolic CHF.  12/17- foley leaking, nurse found  large clot after flushing foley, foley now working well.   12/18: Foley working well.  10. PAF: Monitor HR tid--on Eliquis and atrovastatin 11. ABLA: Continue iron supplement.  12. Constipation: Had small BM 12/11added Senna to colace.    -bm this am 12/12  12/15- LBM 12/13- large 13. T2DM: Will monitor BS ac/hs. On Glipizide 5 mg bid--use SSI prn.     CBG (last 3)  Recent Labs    12/31/18 1702 12/31/18 2113 01/01/19 0615  GLUCAP 114* 141* 83    12/13-fair control so far. Monitor for pattern  12/14- a few lower, but not low BGs- con't meds  12/17, 12/18: BG 74 this morning, well controlled 14. HLD: Lipitor 40mg  QD    LOS: 7 days A FACE TO FACE EVALUATION WAS PERFORMED  Naeema Patlan P Kimi Kroft 01/01/2019, 9:28 AM

## 2019-01-01 NOTE — Progress Notes (Signed)
Physical Therapy Weekly Progress Note  Patient Details  Name: [name] MRN: [id number] Date of Birth: [birth year]  Beginning of progress report period: December 25, 2018 End of progress report period: January 01, 2019  Today's Date: 01/01/2019 PT Individual Time: 0800-0900 PT Individual Time Calculation (min): 60 min   Patient has partially met 2 of 2 short term goals.  He is able to perform bed mobility including supine to sit w/mod assist, maintains sitting balance w/cga to assist but mild post tendency w/donning of prosthesis and transferring. He is able to transfer to/from wc and commode w/mod assist, set up assist, and much verbal cueing for sequencing/mechanics and repositionining of prosthesis during transfers.  He is demonstrating progress but carryover/recall is somewhat limited and would benefit from repeated training.    Patient continues to demonstrate the following deficits muscle weakness and muscle joint tightness, decreased cardiorespiratoy endurance, decreased memory and decreased sitting balance, decreased postural control and decreased balance strategies and therefore will continue to benefit from skilled PT intervention to increase functional independence with mobility.  Patient progressing toward long term goals..  Would benefit from family training session to address hoyer lift use for transfers.  Continue plan of care.  PT Short Term Goals Week 1:  PT Short Term Goal 1 (Week 1): Pt will be able to demonstrate functional dynamic sitting balance with CGA. PT Short Term Goal 2 (Week 1): Pt will be able to perform bed <> chair transfers with mod assist. Week 2:    Week 3:    Week 4:     Skilled Therapeutic Interventions/Progress Updates:  Pt initially supine in bed and agreeable to treatment.  Pt rolls L and R w/mod assist to complete lower body, additional time.  Side to sit w/mod assist, use of rails, and additional time.  In sitting, pt able to verbalize correct  sequencing for donning prosthesis.  Donns w/set up and , min assist for sock and liner, mod assist for prosthesis, cga for balance w/mild post tendency.  SBT bed to wc w/set up, mod assist, verbal cueing for wt shifting/head hips relationship, ant wt shift, hand placement.  Pt requesting to use commode for BM.  wc to commode SBT w/mod assist, set up and cues as above.  Also cues for repositioing prosthesis for optimal leverage/use during /transfer.  Dependent for lowering pants and brief, mod assist for boosting.  Pt had BM, dependent for hygiene, able to lean to L and R using armrests for support during hygiene.  Pt able to boost on commode seat w/mod to max assist from therapist, second person assist to raise brief and pants.  Commode to wc SBT w/mod assist.  All transfers reqire additional time and significant verbal/tactile cueing for sequencing and mechanics.  Pt left OOB in wc w/chair alarm set and needs in reach.  Therapy Documentation Precautions:  Precautions Precautions: Fall Precaution Comments: prior R transtibial amputation; wound VAC to L residual limb Required Braces or Orthoses: Other Brace Other Brace: Limbguard LLE Restrictions Weight Bearing Restrictions: Yes LLE Weight Bearing: Non weight bearing Other Position/Activity Restrictions: R BK amp is healed; Prosthesis is new; he is on a wearing/breaking in schedule now   Therapy/Group: Individual Therapy  Callie Fielding, E[place] 01/01/2019, 12:59 PM

## 2019-01-01 NOTE — Progress Notes (Signed)
Occupational Therapy Session Note  Patient Details  Name: HONORIO DEVOL MRN: [id number] Date of Birth: 04/08/1927  Today's Date: 01/01/2019 OT Individual Time: 1330-1425 OT Individual Time Calculation (min): 55 min    Short Term Goals: Week 2:  OT Short Term Goal 1 (Week 2): STG=LTG secondary to ELOS  Skilled Therapeutic Interventions/Progress Updates:    Pt resting in w/c upon arrival.  OT intervention with focus on donning RLE prosthesis, slide board transfers, bed mobility, education, and activity tolerance to increase independence and reduce burden of care. Pt unable to direct donning of prosthesis and required max verbal cues to assist with pushing RLE into prosthesis. Slide board transfer to bed with max A +2 for safety. Pt able to assist with repositioning in bed.  Pt rolls R<>L with min A for placement of pad. Instructed pt to remind staff to remove prosthesis at 5:30 (3.5 hours). Infor written on white board. Pt remained in bed with all needs within reach and bed alarm activated.  RN notified of prosthesis schedule for afternoon.   Therapy Documentation Precautions:  Precautions Precautions: Fall Precaution Comments: prior R transtibial amputation; wound VAC to L residual limb Required Braces or Orthoses: Other Brace Other Brace: Limbguard LLE Restrictions Weight Bearing Restrictions: Yes LLE Weight Bearing: Non weight bearing Other Position/Activity Restrictions: R BK amp is healed; Prosthesis is new; he is on a wearing/breaking in schedule now  Pain: Pt denies pain this afternoon  Therapy/Group: Individual Therapy  [name] 01/01/2019, 2:26 PM

## 2019-01-01 NOTE — Progress Notes (Signed)
Occupational Therapy Weekly Progress Note  Patient Details  Name: [name] MRN: [id number] Date of Birth: 07/04/1927  Beginning of progress report period: December 26, 2018 End of progress report period: January 01, 2019  Patient has met 3 of 3 short term goals.  Pt is making steady progress towards LTGs. Pt performs slide board transfers with max A when RLE prosthesis is donned. Pt maintains sitting balance EOB with supervision and performs lateral leans EOB and on BSC with min A. Pt requires min A for rolling R<>L in bed to facilitate LB bathing and dressing tasks. Pt's family has not been present for education.  Pt's daughter scheduled for education on 12/20.   Patient continues to demonstrate the following deficits: muscle weakness and muscle joint tightness, decreased cardiorespiratoy endurance and decreased sitting balance, decreased standing balance and decreased balance strategies and therefore will continue to benefit from skilled OT intervention to enhance overall performance with BADL and Reduce care partner burden.  Patient progressing toward long term goals..  Continue plan of care.  OT Short Term Goals Week 1:  OT Short Term Goal 1 (Week 1): Pt wil transfer to BSC with LRAD and MAX A of 1 to decrease burden of care OT Short Term Goal 1 - Progress (Week 1): Met OT Short Term Goal 2 (Week 1): Pt will sit EOB with S for 5 min to demo improved sitting balance OT Short Term Goal 2 - Progress (Week 1): Met OT Short Term Goal 3 (Week 1): Pt will complete lateral leans with MOD A in prep for toileting OT Short Term Goal 3 - Progress (Week 1): Met Week 2:  OT Short Term Goal 1 (Week 2): STG=LTG secondary to ELOS   [name], [name] 01/01/2019, 6:51 AM  

## 2019-01-01 NOTE — Plan of Care (Signed)
  Problem: Consults Goal: RH GENERAL PATIENT EDUCATION Description: See Patient Education module for education specifics. Outcome: Progressing   Problem: RH BOWEL ELIMINATION Goal: RH STG MANAGE BOWEL WITH ASSISTANCE Description: STG Manage Bowel with Min Assistance. Outcome: Progressing   Problem: RH BLADDER ELIMINATION Goal: RH STG MANAGE BLADDER WITH ASSISTANCE Description: STG Manage Bladder With mod I Assistance Outcome: Progressing   Problem: RH SKIN INTEGRITY Goal: RH STG ABLE TO PERFORM INCISION/WOUND CARE W/ASSISTANCE Description: STG Able To Perform Incision/Wound Care With min Assistance. Outcome: Progressing   Problem: RH SAFETY Goal: RH STG DECREASED RISK OF FALL WITH ASSISTANCE Description: STG Decreased Risk of Fall With cues and reminders Outcome: Progressing   Problem: RH PAIN MANAGEMENT Goal: RH STG PAIN MANAGED AT OR BELOW PT'S PAIN GOAL Description: Pain level less than 4 on scale of 0-10 Outcome: Progressing   Problem: RH KNOWLEDGE DEFICIT GENERAL Goal: RH STG INCREASE KNOWLEDGE OF SELF CARE AFTER HOSPITALIZATION Description: Pt will be able to adhere to medication regimen with mod I assist from family. Pt will be able to prevent infection to new amputation with mod I assist from family upon discharge.  Outcome: Progressing

## 2019-01-02 DIAGNOSIS — D62 Acute posthemorrhagic anemia: Secondary | ICD-10-CM

## 2019-01-02 DIAGNOSIS — G8918 Other acute postprocedural pain: Secondary | ICD-10-CM

## 2019-01-02 DIAGNOSIS — Z89511 Acquired absence of right leg below knee: Secondary | ICD-10-CM | POA: Insufficient documentation

## 2019-01-02 DIAGNOSIS — R338 Other retention of urine: Secondary | ICD-10-CM

## 2019-01-02 DIAGNOSIS — Z89512 Acquired absence of left leg below knee: Secondary | ICD-10-CM | POA: Insufficient documentation

## 2019-01-02 DIAGNOSIS — N401 Enlarged prostate with lower urinary tract symptoms: Secondary | ICD-10-CM

## 2019-01-02 DIAGNOSIS — I5032 Chronic diastolic (congestive) heart failure: Secondary | ICD-10-CM

## 2019-01-02 LAB — GLUCOSE, CAPILLARY
Glucose-Capillary: 80 mg/dL (ref 70–99)
Glucose-Capillary: 109 mg/dL — ABNORMAL HIGH (ref 70–99)
Glucose-Capillary: 120 mg/dL — ABNORMAL HIGH (ref 70–99)
Glucose-Capillary: 167 mg/dL — ABNORMAL HIGH (ref 70–99)

## 2019-01-02 LAB — URINALYSIS, ROUTINE W REFLEX MICROSCOPIC
Bilirubin Urine: NEGATIVE
Glucose, UA: NEGATIVE mg/dL
Ketones, ur: NEGATIVE mg/dL
Nitrite: NEGATIVE
Protein, ur: 30 mg/dL — AB
Specific Gravity, Urine: 1.011 (ref 1.005–1.030)
pH: 6 (ref 5.0–8.0)

## 2019-01-02 NOTE — Plan of Care (Signed)
  Problem: Consults Goal: RH GENERAL PATIENT EDUCATION Description: See Patient Education module for education specifics. Outcome: Progressing   Problem: RH BOWEL ELIMINATION Goal: RH STG MANAGE BOWEL WITH ASSISTANCE Description: STG Manage Bowel with Min Assistance. Outcome: Progressing   Problem: RH BLADDER ELIMINATION Goal: RH STG MANAGE BLADDER WITH ASSISTANCE Description: STG Manage Bladder With mod I Assistance Outcome: Progressing   Problem: RH SKIN INTEGRITY Goal: RH STG ABLE TO PERFORM INCISION/WOUND CARE W/ASSISTANCE Description: STG Able To Perform Incision/Wound Care With min Assistance. Outcome: Progressing   Problem: RH SAFETY Goal: RH STG ADHERE TO SAFETY PRECAUTIONS W/ASSISTANCE/DEVICE Description: STG Adhere to Safety Precautions With cues and reminders  Outcome: Progressing Goal: RH STG DECREASED RISK OF FALL WITH ASSISTANCE Description: STG Decreased Risk of Fall With cues and reminders Outcome: Progressing   Problem: RH PAIN MANAGEMENT Goal: RH STG PAIN MANAGED AT OR BELOW PT'S PAIN GOAL Description: Pain level less than 4 on scale of 0-10 Outcome: Progressing   Problem: RH KNOWLEDGE DEFICIT GENERAL Goal: RH STG INCREASE KNOWLEDGE OF SELF CARE AFTER HOSPITALIZATION Description: Pt will be able to adhere to medication regimen with mod I assist from family. Pt will be able to prevent infection to new amputation with mod I assist from family upon discharge.  Outcome: Progressing

## 2019-01-02 NOTE — Progress Notes (Signed)
[place] PHYSICAL MEDICINE & REHABILITATION PROGRESS NOTE   Subjective/Complaints: Patient seen laying in bed this morning.  He states he slept well overnight.  He denies complaints.  ROS: Denies CP, SOB, N/V/D  Objective:   No results found. No results for input(s): WBC, HGB, HCT, PLT in the last 72 hours. No results for input(s): NA, K, CL, CO2, GLUCOSE, BUN, CREATININE, CALCIUM in the last 72 hours.  Intake/Output Summary (Last 24 hours) at 01/02/2019 1343 Last data filed at 01/02/2019 1259 Gross per 24 hour  Intake 1002 ml  Output 2550 ml  Net -1548 ml     Physical Exam: Vital Signs Blood pressure (!) 135/53, pulse (!) 52, temperature 97.6 F (36.4 C), resp. rate 14, weight 85.8 kg, SpO2 98 %. Constitutional: No distress . Vital signs reviewed. HENT: Normocephalic.  Atraumatic. Eyes: EOMI. No discharge. Cardiovascular: No JVD. Respiratory: Normal effort.  No stridor. GI: Non-distended. Skin: Left BKA with dressing C/D/I Psych: Normal mood.  Normal behavior. Musc: Left BKA with mild edema Right BKA Neuro: Alert HOH Motor: Bilateral upper extremities: 4/5 proximal distal Bilateral lower extremities: Hip flexion, knee extension 4+/5  Assessment/Plan: 1. Functional deficits secondary to left BKA which require 3+ hours per day of interdisciplinary therapy in a comprehensive inpatient rehab setting.  Physiatrist is providing close team supervision and 24 hour management of active medical problems listed below.  Physiatrist and rehab team continue to assess barriers to discharge/monitor patient progress toward functional and medical goals  Care Tool:  Bathing    Body parts bathed by patient: Right arm, Left arm, Chest, Abdomen, Face, Left upper leg, Right upper leg, Front perineal area   Body parts bathed by helper: Buttocks, Right upper leg, Left upper leg Body parts n/a: Left lower leg, Right lower leg   Bathing assist Assist Level: 2 Helpers(sit to stand in  stedy for peri hygiene/buttocks)     Upper Body Dressing/Undressing Upper body dressing   What is the patient wearing?: Pull over shirt    Upper body assist Assist Level: Supervision/Verbal cueing    Lower Body Dressing/Undressing Lower body dressing      What is the patient wearing?: Incontinence brief     Lower body assist Assist for lower body dressing: Total Assistance - Patient < 25%     Toileting Toileting    Toileting assist Assist for toileting: 2 Helpers     Transfers Chair/bed transfer  Transfers assist     Chair/bed transfer assist level: Moderate Assistance - Patient 50 - 74% Chair/bed transfer assistive device: Sliding board   Locomotion Ambulation   Ambulation assist   Ambulation activity did not occur: Safety/medical concerns(B BKA with R prosthesis)          Walk 10 feet activity   Assist  Walk 10 feet activity did not occur: Safety/medical concerns        Walk 50 feet activity   Assist Walk 50 feet with 2 turns activity did not occur: Safety/medical concerns         Walk 150 feet activity   Assist Walk 150 feet activity did not occur: Safety/medical concerns         Walk 10 feet on uneven surface  activity   Assist Walk 10 feet on uneven surfaces activity did not occur: Safety/medical concerns         Wheelchair     Assist Will patient use wheelchair at discharge?: Yes Type of Wheelchair: Manual    Wheelchair assist level: Supervision/Verbal cueing  Max wheelchair distance: 38'    Wheelchair 50 feet with 2 turns activity    Assist        Assist Level: Supervision/Verbal cueing   Wheelchair 150 feet activity     Assist      Assist Level: Supervision/Verbal cueing   Blood pressure (!) 135/53, pulse (!) 52, temperature 97.6 F (36.4 C), resp. rate 14, weight 85.8 kg, SpO2 98 %.  Medical Problem List and Plan: 1.  Impaired mobility and ADLs secondary to dehiscence of left amputation  stump, left BKA  Continue CIR 2.  Antithrombotics: -DVT/anticoagulation:  Pharmaceutical: Other (comment)--Apixaban             -antiplatelet therapy: ASA/Plavix 3. Pain Management:   -tramadol for moderate pain  -oxycodone 2.5-5mg  for more severe pain  Controlled with medication on 12/19 4. Mood: LCSW to follow for evaluation and support.              -antipsychotic agents: N/A 5. Neuropsych: This patient is capable of making decisions on hs own behalf. 6. Skin/Wound Care: On Vitamin C.  Added prostat to promote wound healing. 7. Fluids/Electrolytes/Nutrition:  Encourage PO  BUN elevated on 12/16, labs ordered for Monday 8.  Diastolic CHF: Monitor for signs of overload--abdominal girth has decreased.   Continue Lasix and heart healthy diet.   -daily weights Filed Weights   12/31/18 0421 01/01/19 0500 01/02/19 0443  Weight: 80.3 kg 86.3 kg 85.8 kg    Stable on 12/19 9. BPH with urinary retention: On Proscar and Flomax. History of UTIs in past 3 months    -OOB to toilet/ BSC to help with emptying.  Encourage Double voids, extra time    -no urecholine d/t cv hx,    -ua negative, urine without odor    -pt already on max flomax dose  Continue Foley, follow-up with urology outpatient  10. PAF: Monitor HR tid--on Eliquis and atrovastatin 11. ABLA: Continue iron supplement.   Hemoglobin 9.1 on 12/15, labs ordered for Monday  Continue to monitor 12. Constipation:    Improving 13. T2DM: Will monitor BS ac/hs. On Glipizide 5 mg bid--use SSI prn.     CBG (last 3)  Recent Labs    01/01/19 2121 01/02/19 0614 01/02/19 1136  GLUCAP 120* 80 109*   Relatively controlled on 12/19 14. HLD: Lipitor 40mg  QD    LOS: 8 days A FACE TO FACE EVALUATION WAS PERFORMED  Adebayo Ensminger Lorie Phenix 01/02/2019, 1:43 PM

## 2019-01-03 ENCOUNTER — Encounter (HOSPITAL_COMMUNITY): Payer: Medicare Other

## 2019-01-03 ENCOUNTER — Ambulatory Visit (HOSPITAL_COMMUNITY): Payer: Medicare Other

## 2019-01-03 DIAGNOSIS — R7309 Other abnormal glucose: Secondary | ICD-10-CM

## 2019-01-03 DIAGNOSIS — R8290 Unspecified abnormal findings in urine: Secondary | ICD-10-CM

## 2019-01-03 DIAGNOSIS — R339 Retention of urine, unspecified: Secondary | ICD-10-CM | POA: Insufficient documentation

## 2019-01-03 LAB — GLUCOSE, CAPILLARY
Glucose-Capillary: 79 mg/dL (ref 70–99)
Glucose-Capillary: 103 mg/dL — ABNORMAL HIGH (ref 70–99)
Glucose-Capillary: 102 mg/dL — ABNORMAL HIGH (ref 70–99)
Glucose-Capillary: 139 mg/dL — ABNORMAL HIGH (ref 70–99)

## 2019-01-03 NOTE — Progress Notes (Signed)
Physical Therapy Session Note  Patient Details  Name: [name] MRN: [id number] Date of Birth: 1927-04-25  Today's Date: 01/03/2019 PT Individual Time: 1000-1100 PT Individual Time Calculation (min): 60 min   Short Term Goals: Week 1:  PT Short Term Goal 1 (Week 1): Pt will be able to demonstrate functional dynamic sitting balance with CGA. PT Short Term Goal 2 (Week 1): Pt will be able to perform bed <> chair transfers with mod assist. Week 2:  PT Short Term Goal 1 (Week 2): STG=LTG due to ELOS.  Skilled Therapeutic Interventions/Progress Updates:     Patient in bed with his daughter at bedside upon PT arrival. Patient alert and agreeable to PT session. Patient reported 5/10 L residual limb pain during session, RN provided pain medicine during session. PT provided repositioning, rest breaks, and distraction as pain interventions throughout session. Patient's daughter participated in family education throughout session. Discussed patient's prior mobility with family at home and lift options. Daughter reports that they do have a sling that will allow the patient to transfer without his prosthesis. Daughter to provide videos of her using the lift with her son to allow PT to provide safety education prior to patient going home.   Patient with R prosthesis donned prior to session. Patient's daughter doffed and donned his prosthesis in sitting with min cues for liner placement and technique for locking his liner into the socket. Educated patient and his daughter on wear schedule for his prosthesis and liner, care of liner and cleaning/hygeine, and clicking the liner ~40 click with a 3 ply sock into the socket to ensure the patient is all the way down into the socket for proper alignment and reduced pressure on his residual limb. Also educated on importance of B knee ROM, currently patient presents with decreased L knee extension, re-inforced wearing limb guard to promote extension during recovery,  and decreased R knee flexion, demonstrated stretches in sitting to promote increased flexion.    Therapeutic Activity: Bed Mobility: Patient performed supine to/from sit with mod A using HOB elevated and bed rails with his daughter. Provided verbal cues for having patient push up from the bed rather than pull up on his daughter for safety. He then performed supine to sit with min A with increased time with cues for rolling to R side-lying, setting is R elbow, and pushing up with B UEs once his LEs were off the bed to demonstrate a technique for reduced caregiver burden with mobility.  Transfers: Patient performed a slide board transfer with PT demonstrating assist technique for board placement and placement of the patient's R prosthesis and facilitating pushing through his prosthesis during the transfer with mod-min A.   Patient in w/c in room with his daughter at end of session with breaks locked and handed off to [place] , OT at end of session to continue family education.    Therapy Documentation Precautions:  Precautions Precautions: Fall Precaution Comments: prior R transtibial amputation; wound VAC to L residual limb Required Braces or Orthoses: Other Brace Other Brace: Limbguard LLE Restrictions Weight Bearing Restrictions: Yes LLE Weight Bearing: Non weight bearing Other Position/Activity Restrictions: R BK amp is healed; Prosthesis is new; he is on a wearing/breaking in schedule now    Therapy/Group: Individual Therapy  Marlayna Bannister L Amour Cutrone PT, DPT  01/03/2019, 5:15 PM

## 2019-01-03 NOTE — Plan of Care (Signed)
  Problem: Consults Goal: RH GENERAL PATIENT EDUCATION Description: See Patient Education module for education specifics. Outcome: Progressing   Problem: RH SAFETY Goal: RH STG ADHERE TO SAFETY PRECAUTIONS W/ASSISTANCE/DEVICE Description: STG Adhere to Safety Precautions With cues and reminders  Outcome: Progressing   Problem: RH PAIN MANAGEMENT Goal: RH STG PAIN MANAGED AT OR BELOW PT'S PAIN GOAL Description: Pain level less than 4 on scale of 0-10 Outcome: Progressing

## 2019-01-03 NOTE — Progress Notes (Addendum)
[name] PHYSICAL MEDICINE & REHABILITATION PROGRESS NOTE   Subjective/Complaints: Patient seen sitting up in bed this morning.  He states he slept well overnight.  He denies complaints.  Discussed UA results with nursing.  ROS: Denies CP, SOB, N/V/D  Objective:   No results found. No results for input(s): WBC, HGB, HCT, PLT in the last 72 hours. No results for input(s): NA, K, CL, CO2, GLUCOSE, BUN, CREATININE, CALCIUM in the last 72 hours.  Intake/Output Summary (Last 24 hours) at 01/03/2019 0832 Last data filed at 01/03/2019 4970 Gross per 24 hour  Intake 1321 ml  Output 2625 ml  Net -1304 ml     Physical Exam: Vital Signs Blood pressure (!) 150/57, pulse (!) 52, temperature 97.8 F (36.6 C), resp. rate 18, weight 85.9 kg, SpO2 98 %. Constitutional: No distress . Vital signs reviewed. HENT: Normocephalic.  Atraumatic. Eyes: EOMI. No discharge. Cardiovascular: No JVD. Respiratory: Normal effort.  No stridor. GI: Non-distended. Skin: Left BKA with dressing C/D/I Psych: Normal mood.  Normal behavior. Musc: Left BKA with mild edema Right BKA Neuro: Alert HOH Motor: Bilateral upper extremities: 4/5 proximal dista Bilateral lower extremities: Hip flexion, knee extension 4+/5, unchanged  Assessment/Plan: 1. Functional deficits secondary to left BKA which require 3+ hours per day of interdisciplinary therapy in a comprehensive inpatient rehab setting.  Physiatrist is providing close team supervision and 24 hour management of active medical problems listed below.  Physiatrist and rehab team continue to assess barriers to discharge/monitor patient progress toward functional and medical goals  Care Tool:  Bathing    Body parts bathed by patient: Right arm, Left arm, Chest, Abdomen, Face, Left upper leg, Right upper leg, Front perineal area   Body parts bathed by helper: Buttocks, Right upper leg, Left upper leg Body parts n/a: Left lower leg, Right lower leg    Bathing assist Assist Level: 2 Helpers(sit to stand in stedy for peri hygiene/buttocks)     Upper Body Dressing/Undressing Upper body dressing   What is the patient wearing?: Pull over shirt    Upper body assist Assist Level: Supervision/Verbal cueing    Lower Body Dressing/Undressing Lower body dressing      What is the patient wearing?: Incontinence brief     Lower body assist Assist for lower body dressing: Total Assistance - Patient < 25%     Toileting Toileting    Toileting assist Assist for toileting: 2 Helpers     Transfers Chair/bed transfer  Transfers assist     Chair/bed transfer assist level: Moderate Assistance - Patient 50 - 74% Chair/bed transfer assistive device: Sliding board   Locomotion Ambulation   Ambulation assist   Ambulation activity did not occur: Safety/medical concerns(B BKA with R prosthesis)          Walk 10 feet activity   Assist  Walk 10 feet activity did not occur: Safety/medical concerns        Walk 50 feet activity   Assist Walk 50 feet with 2 turns activity did not occur: Safety/medical concerns         Walk 150 feet activity   Assist Walk 150 feet activity did not occur: Safety/medical concerns         Walk 10 feet on uneven surface  activity   Assist Walk 10 feet on uneven surfaces activity did not occur: Safety/medical concerns         Wheelchair     Assist Will patient use wheelchair at discharge?: Yes Type of Wheelchair: Manual  Wheelchair assist level: Supervision/Verbal cueing Max wheelchair distance: 105'    Wheelchair 50 feet with 2 turns activity    Assist        Assist Level: Supervision/Verbal cueing   Wheelchair 150 feet activity     Assist      Assist Level: Supervision/Verbal cueing   Blood pressure (!) 150/57, pulse (!) 52, temperature 97.8 F (36.6 C), resp. rate 18, weight 85.9 kg, SpO2 98 %.  Medical Problem List and Plan: 1.  Impaired  mobility and ADLs secondary to dehiscence of left amputation stump, left BKA  Continue CIR 2.  Antithrombotics: -DVT/anticoagulation:  Pharmaceutical: Other (comment)--Apixaban             -antiplatelet therapy: ASA/Plavix 3. Pain Management:   -tramadol for moderate pain  -oxycodone 2.5-5mg  for more severe pain  Controlled with medication on 12/20 4. Mood: LCSW to follow for evaluation and support.              -antipsychotic agents: N/A 5. Neuropsych: This patient is capable of making decisions on hs own behalf. 6. Skin/Wound Care: On Vitamin C.  Added prostat to promote wound healing. 7. Fluids/Electrolytes/Nutrition:  Encourage PO  BUN elevated on 12/16, labs ordered for tomorrow 8.  Diastolic CHF: Monitor for signs of overload--abdominal girth has decreased.   Continue Lasix and heart healthy diet.   -daily weights Filed Weights   01/01/19 0500 01/02/19 0443 01/03/19 0422  Weight: 86.3 kg 85.8 kg 85.9 kg    Stable on 12/20 9. BPH with urinary retention: On Proscar and Flomax. History of UTIs in past 3 months    -OOB to toilet/ BSC to help with emptying.  Encourage Double voids, extra time    -no urecholine d/t cv hx,    -pt already on max flomax dose  Continue Foley, follow-up with urology outpatient  10. PAF: Monitor HR tid--on Eliquis and atrovastatin 11. ABLA: Continue iron supplement.   Hemoglobin 9.1 on 12/15, labs ordered for tomorrow  Continue to monitor 12. Constipation:    Improving 13. T2DM: Will monitor BS ac/hs. On Glipizide 5 mg bid--use SSI prn.     CBG (last 3)  Recent Labs    01/02/19 1635 01/02/19 2110 01/03/19 0605  GLUCAP 120* 167* 79   Slightly labile on 12/20, monitor for trend 14. HLD: Lipitor 40mg  QD 15.  Abnormal UA  Previously ordered collected on 12/19.  Discussed with nursing-asymptomatic at present.  Recent urine culture on 12/15 no growth  See #9  Monitor for now    LOS: 9 days A FACE TO FACE EVALUATION WAS PERFORMED  [name] [name] 01/03/2019, 8:32 AM

## 2019-01-03 NOTE — Progress Notes (Signed)
Occupational Therapy Session Note  Patient Details  Name: [name] MRN: [id number] Date of Birth: 02/09/1927  Today's Date: 01/03/2019 OT Individual Time: 1105-1200 OT Individual Time Calculation (min): 55 min    Short Term Goals: Week 2:  OT Short Term Goal 1 (Week 2): STG=LTG secondary to ELOS  Skilled Therapeutic Interventions/Progress Updates:    Pt resting in w/c upon arrival with daughter present for education.  Daughter practiced slide board transfer w/c>bed and assisted pt with repositioning in bed. Daughter provided appropriate level of verbal cues and assistance (min A) during transfer.  Pt requires more than a reasonable amount of time to complete transfer with rest breaks while sitting on slide board.  Therapist assisted with slide board transfer back to w/c with min A. Daughter practiced assisting pt with doffing/donning RLE prosthesis X 2. Daughter and son to take video/pictures of home lift equipment for further assessment tomorrow. Pt remained seated in w/c with daughter present and belt alarm activated. All needs within reach.   Therapy Documentation Precautions:  Precautions Precautions: Fall Precaution Comments: prior R transtibial amputation; wound VAC to L residual limb Required Braces or Orthoses: Other Brace Other Brace: Limbguard LLE Restrictions Weight Bearing Restrictions: Yes LLE Weight Bearing: Non weight bearing Other Position/Activity Restrictions: R BK amp is healed; Prosthesis is new; he is on a wearing/breaking in schedule now   Pain: Pain Assessment Pain Scale: 0-10 Pain Score: 5  Pain Type: Acute pain Pain Location: Leg Pain Orientation: Left Pain Descriptors / Indicators: Throbbing Pain Onset: Gradual Pain Intervention(s): Meds admin before therapy, repositioned  Therapy/Group: Individual Therapy  [name] 01/03/2019, 12:01 PM

## 2019-01-03 NOTE — Plan of Care (Signed)
  Problem: Consults Goal: RH GENERAL PATIENT EDUCATION Description: See Patient Education module for education specifics. Outcome: Progressing   Problem: RH BOWEL ELIMINATION Goal: RH STG MANAGE BOWEL WITH ASSISTANCE Description: STG Manage Bowel with Min Assistance. Outcome: Progressing   Problem: RH BLADDER ELIMINATION Goal: RH STG MANAGE BLADDER WITH ASSISTANCE Description: STG Manage Bladder With mod I Assistance Outcome: Progressing   Problem: RH SKIN INTEGRITY Goal: RH STG ABLE TO PERFORM INCISION/WOUND CARE W/ASSISTANCE Description: STG Able To Perform Incision/Wound Care With min Assistance. Outcome: Progressing   Problem: RH SAFETY Goal: RH STG ADHERE TO SAFETY PRECAUTIONS W/ASSISTANCE/DEVICE Description: STG Adhere to Safety Precautions With cues and reminders  Outcome: Progressing Goal: RH STG DECREASED RISK OF FALL WITH ASSISTANCE Description: STG Decreased Risk of Fall With cues and reminders Outcome: Progressing   Problem: RH PAIN MANAGEMENT Goal: RH STG PAIN MANAGED AT OR BELOW PT'S PAIN GOAL Description: Pain level less than 4 on scale of 0-10 Outcome: Progressing   Problem: RH KNOWLEDGE DEFICIT GENERAL Goal: RH STG INCREASE KNOWLEDGE OF SELF CARE AFTER HOSPITALIZATION Description: Pt will be able to adhere to medication regimen with mod I assist from family. Pt will be able to prevent infection to new amputation with mod I assist from family upon discharge.  Outcome: Progressing

## 2019-01-04 ENCOUNTER — Inpatient Hospital Stay (HOSPITAL_COMMUNITY): Payer: Medicare Other | Admitting: *Deleted

## 2019-01-04 ENCOUNTER — Inpatient Hospital Stay (HOSPITAL_COMMUNITY): Payer: Medicare Other

## 2019-01-04 LAB — CBC
HCT: 26.4 % — ABNORMAL LOW (ref 39.0–52.0)
Hemoglobin: 8.6 g/dL — ABNORMAL LOW (ref 13.0–17.0)
MCH: 30.7 pg (ref 26.0–34.0)
MCHC: 32.6 g/dL (ref 30.0–36.0)
MCV: 94.3 fL (ref 80.0–100.0)
Platelets: 343 10*3/uL (ref 150–400)
RBC: 2.8 MIL/uL — ABNORMAL LOW (ref 4.22–5.81)
RDW: 17.1 % — ABNORMAL HIGH (ref 11.5–15.5)
WBC: 8.7 10*3/uL (ref 4.0–10.5)
nRBC: 0 % (ref 0.0–0.2)

## 2019-01-04 LAB — GLUCOSE, CAPILLARY
Glucose-Capillary: 79 mg/dL (ref 70–99)
Glucose-Capillary: 112 mg/dL — ABNORMAL HIGH (ref 70–99)
Glucose-Capillary: 171 mg/dL — ABNORMAL HIGH (ref 70–99)
Glucose-Capillary: 145 mg/dL — ABNORMAL HIGH (ref 70–99)

## 2019-01-04 LAB — BASIC METABOLIC PANEL
Anion gap: 11 (ref 5–15)
BUN: 36 mg/dL — ABNORMAL HIGH (ref 8–23)
CO2: 26 mmol/L (ref 22–32)
Calcium: 8.9 mg/dL (ref 8.9–10.3)
Chloride: 99 mmol/L (ref 98–111)
Creatinine, Ser: 1.3 mg/dL — ABNORMAL HIGH (ref 0.61–1.24)
GFR calc Af Amer: 56 mL/min — ABNORMAL LOW (ref >60)
GFR calc non Af Amer: 48 mL/min — ABNORMAL LOW (ref >60)
Glucose, Bld: 78 mg/dL (ref 70–99)
Potassium: 3.9 mmol/L (ref 3.5–5.1)
Sodium: 136 mmol/L (ref 135–145)

## 2019-01-04 MED ORDER — DICLOFENAC SODIUM 1 % EX GEL
2.0000 g | Freq: Four times a day (QID) | CUTANEOUS | Status: DC
  Administered 2019-01-04 – 2019-01-05 (×5): 2 g via TOPICAL
  Filled 2019-01-04: qty 100

## 2019-01-04 NOTE — Progress Notes (Signed)
Physical Therapy Session Note  Patient Details  Name: [name] MRN: [id number] Date of Birth: 1927/02/05  Today's Date: 01/04/2019 PT Individual Time: 1300-1415 PT Individual Time Calculation (min): 75 min   Short Term Goals: Week 2:  PT Short Term Goal 1 (Week 2): STG=LTG due to ELOS.     Skilled Therapeutic Interventions/Progress Updates:  Pt seated in w/c.  Dtr [name] here for continued family ed.  Pt denied pain, but has an area R wrist that is painful with wt bearing.   Prosthesis has been doffed since 10:30, per [name].  Dtr donned R prosthesis, and doffed L limb protector.  Using hand -out, and cues from dtr, pt performed forward lean and R/L lateral leans for pressure releases, x 30 seconds each.    Slide board transfer with cues to [facility] for positioning herself in front of pt, w/c> bed to R.   Using hand-out for HEP, dtr appropriately coached pt in 10 x 1 each --seated : L short arc quad knee extension; supine: L quad sets over towel roll, bil glut sets, L straight leg raises, bil hip adductor squeezes.  1 cue initially to count aloud to avoid Valsalva maneuver. With PT, from reclined position in recliner, 10 x 2 trunk flexion without use of UEs; 10 x 1 active assistive R straight leg raises with prosthesis donned.  Supine> sit with bed features, cues for technique, min assist.  [name] performed slide board transfer as above, to L, into recliner with w/c cushion placed in it.  [name] re-donned L limb protector.  At end of session, pt in recliner with prosthesis on, needs at hand       Therapy Documentation Precautions:  Precautions Precautions: Fall Precaution Comments: prior R transtibial amputation; wound VAC to L residual limb Required Braces or Orthoses: Other Brace Other Brace: Limbguard LLE Restrictions Weight Bearing Restrictions: Yes LLE Weight Bearing: Non weight bearing Other Position/Activity Restrictions: R BK amp is healed; Prosthesis is new; he is on a  wearing/breaking in schedule now       Therapy/Group: Individual Therapy  Keyia Moretto 01/04/2019, 2:27 PM

## 2019-01-04 NOTE — Progress Notes (Signed)
Occupational Therapy Session Note  Patient Details  Name: [name] MRN: [id number] Date of Birth: 1927-08-30  Today's Date: 01/04/2019 OT Individual Time: 0700-0800 OT Individual Time Calculation (min): 60 min    Short Term Goals: Week 2:  OT Short Term Goal 1 (Week 2): STG=LTG secondary to ELOS  Skilled Therapeutic Interventions/Progress Updates:    OT intervention with focus on ongoing bed mobility to facilitate LB bathing/dressing tasks, supine>sit EOB, slide board transfer to wc, activity tolerance, UB bathing/dressing w/c level at sink, and safety awareness to increase independence with BADLs and prepare for discharge home tomorrow. Rolling R<>L in bed using bed rails.  Sitting balance with supervision.  Slide board transfer to w/c with min A and min verbal cues for sequencing. Pt requires tot A for donning prosthesis.  Pt remained seated in w/c with belt alarm activated and all needs within reach.   Therapy Documentation Precautions:  Precautions Precautions: Fall Precaution Comments: prior R transtibial amputation; wound VAC to L residual limb Required Braces or Orthoses: Other Brace Other Brace: Limbguard LLE Restrictions Weight Bearing Restrictions: Yes LLE Weight Bearing: Non weight bearing Other Position/Activity Restrictions: R BK amp is healed; Prosthesis is new; he is on a wearing/breaking in schedule now Pain:  "Not bad but I'll see when I get moving"; pt reports no increase in pain level with movement   Therapy/Group: Individual Therapy  [name] 01/04/2019, 8:04 AM

## 2019-01-04 NOTE — Progress Notes (Signed)
[place] PHYSICAL MEDICINE & REHABILITATION PROGRESS NOTE   Subjective/Complaints:   Pt reports his R wrist hurts- no trauma/falls- doesn't remember how hurt wrist but admits has had gout in feet before, but never in UEs and pain is "Moderate" not like gout.  Doing "pretty good otherwise", per pt. Ready for d/c tomorrow.  ROS: Denies CP, SOB, N/V/D  Objective:   No results found. Recent Labs    01/04/19 0556  WBC 8.7  HGB 8.6*  HCT 26.4*  PLT 343   Recent Labs    01/04/19 0556  NA 136  K 3.9  CL 99  CO2 26  GLUCOSE 78  BUN 36*  CREATININE 1.30*  CALCIUM 8.9    Intake/Output Summary (Last 24 hours) at 01/04/2019 1304 Last data filed at 01/04/2019 0500 Gross per 24 hour  Intake 342 ml  Output 2900 ml  Net -2558 ml     Physical Exam: Vital Signs Blood pressure (!) 144/66, pulse (!) 54, temperature 98.4 F (36.9 C), temperature source Oral, resp. rate 16, weight 83 kg, SpO2 99 %. Constitutional: No distress . Vital signs and labs reviewed. Sitting up in bed; more appropriate, holding R wrist- rubbing top of wrist, NAD HENT: Normocephalic.  Atraumatic. Eyes: EOMI. No discharge. Cardiovascular: No JVD. Respiratory: Normal effort.  No stridor. GI: Non-distended. Skin: Left BKA with dressing C/D/I Psych: Normal mood.  Normal behavior. Musc: Left BKA with mild edema; mild swelling on top/dorsum of R wrist- no bruising- some ecchymoses close by, but not exactly on top of wrist- mild-moderate TTP; no increased heat/redness Right BKA- older Neuro: Alert HOH Motor: Bilateral upper extremities: 4/5 proximal dista Bilateral lower extremities: Hip flexion, knee extension 4+/5, unchanged  Assessment/Plan: 1. Functional deficits secondary to left BKA which require 3+ hours per day of interdisciplinary therapy in a comprehensive inpatient rehab setting.  Physiatrist is providing close team supervision and 24 hour management of active medical problems listed  below.  Physiatrist and rehab team continue to assess barriers to discharge/monitor patient progress toward functional and medical goals  Care Tool:  Bathing    Body parts bathed by patient: Right arm, Left arm, Chest, Abdomen, Front perineal area, Right upper leg, Left upper leg   Body parts bathed by helper: Buttocks Body parts n/a: Right lower leg, Left lower leg   Bathing assist Assist Level: Minimal Assistance - Patient > 75%     Upper Body Dressing/Undressing Upper body dressing   What is the patient wearing?: Pull over shirt    Upper body assist Assist Level: Supervision/Verbal cueing    Lower Body Dressing/Undressing Lower body dressing      What is the patient wearing?: Pants     Lower body assist Assist for lower body dressing: Maximal Assistance - Patient 25 - 49%     Toileting Toileting    Toileting assist Assist for toileting: Total Assistance - Patient < 25%     Transfers Chair/bed transfer  Transfers assist     Chair/bed transfer assist level: Moderate Assistance - Patient 50 - 74% Chair/bed transfer assistive device: Sliding board   Locomotion Ambulation   Ambulation assist   Ambulation activity did not occur: Safety/medical concerns(B BKA with R prosthesis)          Walk 10 feet activity   Assist  Walk 10 feet activity did not occur: Safety/medical concerns        Walk 50 feet activity   Assist Walk 50 feet with 2 turns activity did not  occur: Safety/medical concerns         Walk 150 feet activity   Assist Walk 150 feet activity did not occur: Safety/medical concerns         Walk 10 feet on uneven surface  activity   Assist Walk 10 feet on uneven surfaces activity did not occur: Safety/medical concerns         Wheelchair     Assist Will patient use wheelchair at discharge?: Yes Type of Wheelchair: Manual    Wheelchair assist level: Supervision/Verbal cueing Max wheelchair distance: 105'     Wheelchair 50 feet with 2 turns activity    Assist        Assist Level: Supervision/Verbal cueing   Wheelchair 150 feet activity     Assist      Assist Level: Supervision/Verbal cueing   Blood pressure (!) 144/66, pulse (!) 54, temperature 98.4 F (36.9 C), temperature source Oral, resp. rate 16, weight 83 kg, SpO2 99 %.  Medical Problem List and Plan: 1.  Impaired mobility and ADLs secondary to dehiscence of left amputation stump, left BKA  Continue CIR 2.  Antithrombotics: -DVT/anticoagulation:  Pharmaceutical: Other (comment)--Apixaban             -antiplatelet therapy: ASA/Plavix 3. Pain Management:   -tramadol for moderate pain  -oxycodone 2.5-5mg  for more severe pain  Controlled with medication on 12/21 4. Mood: LCSW to follow for evaluation and support.              -antipsychotic agents: N/A 5. Neuropsych: This patient is capable of making decisions on hs own behalf. 6. Skin/Wound Care: On Vitamin C.  Added prostat to promote wound healing. 7. Fluids/Electrolytes/Nutrition:  Encourage PO  BUN elevated on 12/16, labs ordered for tomorrow  12/21- BUN stable; Cr slightly up to 1.3- usually 1.2-1.3 - still within baseline, it appears;BUN slightly above baseline but stable for hospitalization. 8.  Diastolic CHF: Monitor for signs of overload--abdominal girth has decreased.   Continue Lasix and heart healthy diet.   -daily weights Filed Weights   01/02/19 0443 01/03/19 0422 01/04/19 0500  Weight: 85.8 kg 85.9 kg 83 kg    Dropped 2.9 kg in 1 day- unlikely- will con't to monitor 9. BPH with urinary retention: On Proscar and Flomax. History of UTIs in past 3 months    -OOB to toilet/ BSC to help with emptying.  Encourage Double voids, extra time    -no urecholine d/t cv hx,    -pt already on max flomax dose  Continue Foley, follow-up with urology outpatient  10. PAF: Monitor HR tid--on Eliquis and atrovastatin 11. ABLA: Continue iron supplement.    Hemoglobin 9.1 on 12/15, labs ordered for tomorrow  Continue to monitor 12. Constipation:    Improving 13. T2DM: Will monitor BS ac/hs. On Glipizide 5 mg bid--use SSI prn.     CBG (last 3)  Recent Labs    01/03/19 2103 01/04/19 0628 01/04/19 1149  GLUCAP 139* 79 112*   Better control 12/21- 14. HLD: Lipitor 40mg  QD 15.  Abnormal UA  Previously ordered collected on 12/19.  Discussed with nursing-asymptomatic at present.  Recent urine culture on 12/15 no growth  See #9  Monitor for now 16. R wrist swelling/tenderness  12/21- will add Voltaren gel QID- scheduled since pt won't ask- doesn't appear CURRENTLY to be gout- will monitor.   LOS: 10 days A FACE TO FACE EVALUATION WAS PERFORMED  Verlin Duke 01/04/2019, 1:04 PM

## 2019-01-04 NOTE — Progress Notes (Signed)
Occupational Therapy Discharge Summary  Patient Details  Name: [name] MRN: [id number] Date of Birth: July 23, 1927  Patient has met 4 of 4 long term goals due to improved activity tolerance, improved balance and ability to transfer with prosthesis.  Pt made steady progress with bed mobility, slide board transfers, sitting balance, and BADLs during this admission.  Pt is S for rolling R<>L in bed using bed rails to facilitate care giver assisting with LB bathing/dressing tasks. Dynamic sitting balance EOB with supervision. Pt performs slide board transfers to [facility] with min A after RLE prosthesis donned. Pt requires mod verbal cues during functional transfers with more than a reasonable amount of time.  Pt's daughter has been present and participated in transfers, donning/doffing RLE prosthesis, and bed mobility. Patient's care partner is independent to provide the necessary physical assistance at discharge.     Recommendation:  Patient will benefit from ongoing skilled OT services in home health setting to continue to advance functional skills in the area of BADL.  Equipment: No equipment provided has a w/c, slide board and hoyer lift at home,  Reasons for discharge: treatment goals met and discharge from hospital  Patient/family agrees with progress made and goals achieved: Yes  OT Discharge Vision Baseline Vision/History: Wears glasses Wears Glasses: At all times Patient Visual Report: No change from baseline Vision Assessment?: No apparent visual deficits Perception  Perception: Within Functional Limits Praxis Praxis: Intact Cognition Overall Cognitive Status: Within Functional Limits for tasks assessed Arousal/Alertness: Awake/alert Orientation Level: Oriented X4 Attention: Sustained Sustained Attention: Appears intact Memory: Impaired Memory Impairment: Decreased recall of new information Immediate Memory Recall: Sock;Blue;Bed Memory Recall Sock: With Cue Memory Recall  Blue: Without Cue Memory Recall Bed: Without Cue Awareness: Impaired Problem Solving: Impaired Safety/Judgment: Appears intact Sensation Sensation Light Touch: Impaired Detail Light Touch Impaired Details: Impaired RLE;Impaired LLE Coordination Gross Motor Movements are Fluid and Coordinated: Yes Fine Motor Movements are Fluid and Coordinated: Yes Motor  Motor Motor: Abnormal postural alignment and control Motor - Skilled Clinical Observations: generalized weakness     Trunk/Postural Assessment  Cervical Assessment Cervical Assessment: (forward head) Thoracic Assessment Thoracic Assessment: (rounded shoulders) Lumbar Assessment Lumbar Assessment: (posterior pelvic tilt)  Balance Static Sitting Balance Static Sitting - Level of Assistance: 5: Stand by assistance Dynamic Sitting Balance Dynamic Sitting - Level of Assistance: 5: Stand by assistance Extremity/Trunk Assessment RUE Assessment RUE Assessment: Within Functional Limits LUE Assessment LUE Assessment: Within Functional Limits   [name] 01/04/2019, 2:28 PM

## 2019-01-04 NOTE — Progress Notes (Signed)
Physical Therapy Discharge Summary  Patient Details  Name: [name] MRN: [id number] Date of Birth: Apr 07, 1927  Today's Date: 01/04/2019   Patient has met 6 of 8 long term goals due to improved activity tolerance, improved balance, improved postural control, increased strength, increased range of motion, decreased pain, ability to compensate for deficits and improved coordination.  Patient to discharge at a wheelchair level [place].   Patient's care partner is independent to provide the necessary physical assistance at discharge.  Reasons goals not met: Patient requires +2 assist for safe sit to stand transfers, however, will not be performing functional standing with family at home. Patient's family is supervision with use of the [facility] [place] with min cues, provided clarification for questions and safe technique. Patient's caregiver is confident in using the alternate sling lift at home and demonstrated safe use with a family member via video. Educated on having [place] practice with Harrel Lemon lift if the family determines to use this lift instead, family in agreement.   Recommendation:  Patient will benefit from ongoing skilled PT services in home health setting to continue to advance safe functional mobility, address ongoing impairments in balance, strength/ROM, functional mobility, activity tolerance amputee education, patient/caregiver education, and minimize fall risk.  Equipment: L amputee pad for w/c  Reasons for discharge: treatment goals met  Patient/family agrees with progress made and goals achieved: Yes  PT Discharge Precautions/Restrictions Precautions Precautions: Fall Restrictions Weight Bearing Restrictions: Yes LLE Weight Bearing: Non weight bearing Vision/Perception  Perception Perception: Within Functional Limits Praxis Praxis: Intact  Cognition Overall Cognitive Status: History of cognitive impairments - at baseline Arousal/Alertness: Awake/alert Orientation Level:  Oriented X4 Attention: Sustained Sustained Attention: Appears intact Memory: Impaired Memory Impairment: Decreased recall of new information Immediate Memory Recall: Sock;Blue;Bed Memory Recall Sock: With Cue Memory Recall Blue: Without Cue Memory Recall Bed: Without Cue Awareness: Impaired Problem Solving: Impaired Problem Solving Impairment: Verbal basic;Functional basic Safety/Judgment: Appears intact Sensation Sensation Light Touch: Impaired Detail Light Touch Impaired Details: Impaired RLE;Impaired LLE Coordination Gross Motor Movements are Fluid and Coordinated: Yes Fine Motor Movements are Fluid and Coordinated: Yes Coordination and Movement Description: B BKA with generalized weakness and decreased balance and activity tolerance Motor  Motor Motor: Abnormal postural alignment and control Motor - Skilled Clinical Observations: generalized weakness  Mobility Bed Mobility Bed Mobility: Rolling Right;Rolling Left Rolling Right: Minimal Assistance - Patient > 75% Rolling Left: Minimal Assistance - Patient > 75% Supine to Sit: Minimal Assistance - Patient > 75% Sit to Supine: Minimal Assistance - Patient > 75% Transfers Transfers: Transfer via Geophysicist/field seismologist;Lateral/Scoot Transfers;Sit to Stand;Stand to Sit Sit to Stand: 2 Helpers Sit to Stand Comment: with // bar Stand to Sit: 2 Helpers Lateral/Scoot Transfers: Minimal Assistance - Patient > 75% Transfer (Assistive device): Other (Comment)(slide board and R prosthesis) Transfer via Lift Equipment: Optician, dispensing) Locomotion  Gait Ambulation: No Gait Gait: No Stairs / Additional Locomotion Stairs: No Architect: Yes Wheelchair Assistance: Chartered loss adjuster: Both upper extremities Wheelchair Parts Management: Supervision/cueing Distance: 155'  Trunk/Postural Assessment  Cervical Assessment Cervical Assessment: Exceptions to WFL(forward head) Thoracic  Assessment Thoracic Assessment: Exceptions to WFL(rounded shoulders) Lumbar Assessment Lumbar Assessment: Exceptions to WFL(post pelvic tilt) Postural Control Postural Control: Deficits on evaluation(delayed)  Balance Balance Balance Assessed: Yes Static Sitting Balance Static Sitting - Level of Assistance: 5: Stand by assistance Dynamic Sitting Balance Dynamic Sitting - Level of Assistance: 5: Stand by assistance Extremity Assessment  RUE Assessment RUE Assessment: Within Functional Limits LUE  Assessment LUE Assessment: Within Functional Limits RLE Assessment RLE Assessment: Exceptions to [facility] Passive Range of Motion (PROM) Comments: lacking full knee extension/flexion, PROM 3 degrees- 90 degrees of flexion General Strength Comments: Grossly in sitting 4/5 hip flexion and knee flexion/extension LLE Assessment LLE Assessment: Exceptions to [facility] Passive Range of Motion (PROM) Comments: lacking full knee extension- PROM limited to 15- 95 degrees of flexion. General Strength Comments: grossly at least 3+/5 - edema and new BKA    Danika Kluender L Itzel Lowrimore PT, DPT  01/04/2019, 4:36 PM

## 2019-01-04 NOTE — Progress Notes (Signed)
Physical Therapy Session Note  Patient Details  Name: [name] MRN: [id number] Date of Birth: 11/08/1927  Today's Date: 01/04/2019 PT Individual Time: 7939-0300 PT Individual Time Calculation (min): 90 min   Short Term Goals: Week 2:  PT Short Term Goal 1 (Week 2): STG=LTG due to ELOS.  Skilled Therapeutic Interventions/Progress Updates:     Patient in w/c in room upon PT arrival. Patient alert and agreeable to PT session. Patient reported 4-5/10 R wrist pain during session, RN made aware. PT provided repositioning, rest breaks, and distraction as pain interventions throughout session. Patient's daughter arrived at beginning of session to participate in family education in preparation for discharge tomorrow.   Therapeutic Activity: Transfers: Patient attempted sit to/from stand x1 pulling up facing the outside // bar with mod A +2. Patient reported sharp pain on the lateral side of his R residual limb with the prosthesis when coming to stand and was returned to sitting. Patient's daughter removed the prosthesis independently and PT checked skin. Noted increased redness, resolved in <20 min, and a hard lump on lateral side of incision consistent with scare tissue. PT educated on removing the prosthesis any time there is pain in his residual limb and checking the skin looking for any redness to resolve in <20 min. Also educated on performed scar massage over the scar to break of scar tissue and provided demonstration. PT demonstrated the use of a hoyer lift to transfer the patient to the [facility] without his prosthesis donned to simulate home set up and lift. Placed sling with max A with patient in w/c. Patient was continent of bowl on the [facility]. His daughter assisted with LB dressing with patient performing lateral leans and peri-care providing max A with min cues form therapist for safe guarding techniques. She then performed a hoyer lift transfer placing the sling as above with min cues for  technique for safety.    Wheelchair Mobility:  Patient propelled wheelchair 155 feet with supervision. Provided verbal cues for steering technique.   Provided education for prosthesis wearing schedule, promotion of ROM, pressure relief, and fall risk prevention and activation of emergency services in the event of a fall throughout session.   Patient in w/c with his daughter in room at end of session with breaks locked, seat belt alarm set, and all needs within reach.    Therapy Documentation Precautions:  Precautions Precautions: Fall Precaution Comments: prior R transtibial amputation; wound VAC to L residual limb Required Braces or Orthoses: Other Brace Other Brace: Limbguard LLE Restrictions Weight Bearing Restrictions: Yes LLE Weight Bearing: Non weight bearing Other Position/Activity Restrictions: R BK amp is healed; Prosthesis is new; he is on a wearing/breaking in schedule now    Therapy/Group: Individual Therapy  Terrye Dombrosky L Rayven Rettig PT, DPT  01/04/2019, 4:15 PM

## 2019-01-04 NOTE — Progress Notes (Signed)
Pt was not able to tolerate prosthesis for the 8hr to RLE. C/o pain, therapy to add sock to reduce the pain and was discussed with daughter to back off on time on and build back up at slower pace. Instructions left on bedside table for changes.

## 2019-01-05 ENCOUNTER — Other Ambulatory Visit: Payer: Self-pay

## 2019-01-05 LAB — CBC
HCT: 26.9 % — ABNORMAL LOW (ref 39.0–52.0)
Hemoglobin: 8.8 g/dL — ABNORMAL LOW (ref 13.0–17.0)
MCH: 30.6 pg (ref 26.0–34.0)
MCHC: 32.7 g/dL (ref 30.0–36.0)
MCV: 93.4 fL (ref 80.0–100.0)
Platelets: 330 10*3/uL (ref 150–400)
RBC: 2.88 MIL/uL — ABNORMAL LOW (ref 4.22–5.81)
RDW: 17.3 % — ABNORMAL HIGH (ref 11.5–15.5)
WBC: 8.2 10*3/uL (ref 4.0–10.5)
nRBC: 0 % (ref 0.0–0.2)

## 2019-01-05 LAB — GLUCOSE, CAPILLARY
Glucose-Capillary: 80 mg/dL (ref 70–99)
Glucose-Capillary: 93 mg/dL (ref 70–99)

## 2019-01-05 MED ORDER — DOCUSATE SODIUM 100 MG PO CAPS: 100.0000 mg | ORAL_CAPSULE | Freq: Two times a day (BID) | ORAL | 0 refills | Status: DC

## 2019-01-05 MED ORDER — DICLOFENAC SODIUM 1 % EX GEL: 2.0000 g | Freq: Four times a day (QID) | CUTANEOUS | 0 refills | Status: DC

## 2019-01-05 MED ORDER — ACETAMINOPHEN 500 MG PO TABS: 500.0000 mg | ORAL_TABLET | Freq: Three times a day (TID) | ORAL | 0 refills | Status: DC

## 2019-01-05 MED ORDER — TRAMADOL HCL 50 MG PO TABS: 50.0000 mg | ORAL_TABLET | Freq: Four times a day (QID) | ORAL | 0 refills | Status: DC | PRN

## 2019-01-05 MED ORDER — SENNOSIDES-DOCUSATE SODIUM 8.6-50 MG PO TABS: 2.0000 | ORAL_TABLET | Freq: Every day | ORAL | Status: DC

## 2019-01-05 NOTE — Progress Notes (Signed)
Patient was discharged from  47M06.  Patient left floor via wheelchair escorted by nursing staff.  Patient and daughter verbalized understanding of discharge instructions as given by Algis Liming, PA.  All patient belongings sent with patient including DME and prescriptions.  Patient appears to be in no immediate distress at this time.    Brita Romp, RN

## 2019-01-05 NOTE — Plan of Care (Signed)
  Problem: Consults Goal: RH GENERAL PATIENT EDUCATION Description: See Patient Education module for education specifics. Outcome: Completed/Met   Problem: RH BOWEL ELIMINATION Goal: RH STG MANAGE BOWEL WITH ASSISTANCE Description: STG Manage Bowel with Mod Assistance. Outcome: Completed/Met   Problem: RH BLADDER ELIMINATION Goal: RH STG MANAGE BLADDER WITH ASSISTANCE Description: STG Manage Bladder With max Assistance Outcome: Completed/Met   Problem: RH SKIN INTEGRITY Goal: RH STG ABLE TO PERFORM INCISION/WOUND CARE W/ASSISTANCE Description: STG Able To Perform Incision/Wound Care With mod Assistance. Outcome: Completed/Met   Problem: RH SAFETY Goal: RH STG ADHERE TO SAFETY PRECAUTIONS W/ASSISTANCE/DEVICE Description: STG Adhere to Safety Precautions With cues and reminders  Outcome: Completed/Met Goal: RH STG DECREASED RISK OF FALL WITH ASSISTANCE Description: STG Decreased Risk of Fall With cues and reminders Outcome: Completed/Met   Problem: RH PAIN MANAGEMENT Goal: RH STG PAIN MANAGED AT OR BELOW PT'S PAIN GOAL Description: Pain level less than 4 on scale of 0-10 Outcome: Completed/Met   Problem: RH KNOWLEDGE DEFICIT GENERAL Goal: RH STG INCREASE KNOWLEDGE OF SELF CARE AFTER HOSPITALIZATION Description: Pt will be able to adhere to medication regimen with mod I assist from family. Pt will be able to prevent infection to new amputation with mod I assist from family upon discharge.  Outcome: Completed/Met

## 2019-01-05 NOTE — Discharge Instructions (Signed)
Inpatient Rehab Discharge Instructions  [name] Discharge date and time:    Activities/Precautions/ Functional Status: 01/05/19 Activity: activity as tolerated Diet: cardiac diet and diabetic diet Wound Care: keep wound clean and dry   Functional status:        ___ No restrictions     ___ Walk up steps independently _X__ 24/7 supervision/assistance   ___ Walk up steps with assistance ___ Intermittent supervision/assistance  ___ Bathe/dress independently ___ Walk with walker     ___ Bathe/dress with assistance ___ Walk Independently    ___ Shower independently ___ Walk with assistance    _X__ Shower with assistance _X__ No alcohol     ___ Return to work/school ________      COMMUNITY REFERRALS UPON DISCHARGE:    Home Health:   PT     OT    RN                      Agency:  [place]   Phone: [phone]   Medical Equipment/Items Ordered:  Left amputee support pad                                                       Agency/Supplier:  [facility] @ [phone]  Special Instructions:  Indwelling Urinary Catheter Care, Adult An indwelling urinary catheter is a thin tube that is put into your bladder. The tube helps to drain pee (urine) out of your body. The tube goes in through your urethra. Your urethra is where pee comes out of your body. Your pee will come out through the catheter, then it will go into a bag (drainage bag). Take good care of your catheter so it will work well. How to wear your catheter and bag Supplies needed  Sticky tape (adhesive tape) or a leg strap.  Alcohol wipe or soap and water (if you use tape).  A clean towel (if you use tape).  Large overnight bag.  Smaller bag (leg bag). Wearing your catheter Attach your catheter to your leg with tape or a leg strap.  Make sure the catheter is not pulled tight.  If a leg strap gets wet, take it off and put on a dry strap.  If you use tape to hold the bag on your leg: 1. Use an  alcohol wipe or soap and water to wash your skin where the tape made it sticky before. 2. Use a clean towel to pat-dry that skin. 3. Use new tape to make the bag stay on your leg. Wearing your bags You should have been given a large overnight bag.  You may wear the overnight bag in the day or night.  Always have the overnight bag lower than your bladder.  Do not let the bag touch the floor.  Before you go to sleep, put a clean plastic bag in a wastebasket. Then hang the overnight bag inside the wastebasket. You should also have a smaller leg bag that fits under your clothes.  Always wear the leg bag below your knee.  Do not wear your leg bag at night. How to care for your skin and catheter Supplies needed  A clean washcloth.  Water and mild soap.  A clean towel. Caring for your skin and catheter      Clean the skin around  your catheter every day: ? Wash your hands with soap and water. ? Wet a clean washcloth in warm water and mild soap. ? Clean the skin around your urethra. ? If you are male: ? Gently spread the folds of skin around your vagina (labia). ? With the washcloth in your other hand, wipe the inner side of your labia on each side. Wipe from front to back. ? If you are male: ? Pull back any skin that covers the end of your penis (foreskin). ? With the washcloth in your other hand, wipe your penis in small circles. Start wiping at the tip of your penis, then move away from the catheter. ? Move the foreskin back in place, if needed. ? With your free hand, hold the catheter close to where it goes into your body. ? Keep holding the catheter during cleaning so it does not get pulled out. ? With the washcloth in your other hand, clean the catheter. ? Only wipe downward on the catheter. ? Do not wipe upward toward your body. Doing this may push germs into your urethra and cause infection. ? Use a clean towel to pat-dry the catheter and the skin around it. Make sure to  wipe off all soap. ? Wash your hands with soap and water.  Shower every day. Do not take baths.  Do not use cream, ointment, or lotion on the area where the catheter goes into your body, unless your doctor tells you to.  Do not use powders, sprays, or lotions on your genital area.  Check your skin around the catheter every day for signs of infection. Check for: ? Redness, swelling, or pain. ? Fluid or blood. ? Warmth. ? Pus or a bad smell. How to empty the bag Supplies needed  Rubbing alcohol.  Gauze pad or cotton ball.  Tape or a leg strap. Emptying the bag Pour the pee out of your bag when it is ?- full, or at least 2-3 times a day. Do this for your overnight bag and your leg bag. 1. Wash your hands with soap and water. 2. Separate (detach) the bag from your leg. 3. Hold the bag over the toilet or a clean pail. Keep the bag lower than your hips and bladder. This is so the pee (urine) does not go back into the tube. 4. Open the pour spout. It is at the bottom of the bag. 5. Empty the pee into the toilet or pail. Do not let the pour spout touch any surface. 6. Put rubbing alcohol on a gauze pad or cotton ball. 7. Use the gauze pad or cotton ball to clean the pour spout. 8. Close the pour spout. 9. Attach the bag to your leg with tape or a leg strap. 10. Wash your hands with soap and water. Follow instructions for cleaning the drainage bag:  From the product maker.  As told by your doctor. How to change the bag Supplies needed  Alcohol wipes.  A clean bag.  Tape or a leg strap. Changing the bag Replace your bag when it starts to leak, smell bad, or look dirty. 1. Wash your hands with soap and water. 2. Separate the dirty bag from your leg. 3. Pinch the catheter with your fingers so that pee does not spill out. 4. Separate the catheter tube from the bag tube where these tubes connect (at the connection valve). Do not let the tubes touch any surface. 5. Clean the  end of the catheter tube with  an alcohol wipe. Use a different alcohol wipe to clean the end of the bag tube. 6. Connect the catheter tube to the tube of the clean bag. 7. Attach the clean bag to your leg with tape or a leg strap. Do not make the bag tight on your leg. 8. Wash your hands with soap and water. General rules   Never pull on your catheter. Never try to take it out. Doing that can hurt you.  Always wash your hands before and after you touch your catheter or bag. Use a mild, fragrance-free soap. If you do not have soap and water, use hand sanitizer.  Always make sure there are no twists or bends (kinks) in the catheter tube.  Always make sure there are no leaks in the catheter or bag.  Drink enough fluid to keep your pee pale yellow.  Do not take baths, swim, or use a hot tub.  If you are male, wipe from front to back after you poop (have a bowel movement). Contact a doctor if:  Your pee is cloudy.  Your pee smells worse than usual.  Your catheter gets clogged.  Your catheter leaks.  Your bladder feels full. Get help right away if:  You have redness, swelling, or pain where the catheter goes into your body.  You have fluid, blood, pus, or a bad smell coming from the area where the catheter goes into your body.  Your skin feels warm where the catheter goes into your body.  You have a fever.  You have pain in your: ? Belly (abdomen). ? Legs. ? Lower back. ? Bladder.  You see blood in the catheter.  Your pee is pink or red.  You feel sick to your stomach (nauseous).  You throw up (vomit).  You have chills.  Your pee is not draining into the bag.  Your catheter gets pulled out. Summary  An indwelling urinary catheter is a thin tube that is placed into the bladder to help drain pee (urine) out of the body.  The catheter is placed into the part of the body that drains pee from the bladder (urethra).  Taking good care of your catheter will keep  it working properly and help prevent problems.  Always wash your hands before and after touching your catheter or bag.  Never pull on your catheter or try to take it out. This information is not intended to replace advice given to you by your health care provider. Make sure you discuss any questions you have with your health care provider. Document Released: 04/27/2012 Document Revised: 04/24/2018 Document Reviewed: 08/16/2016 Elsevier Patient Education  2020 [employer].    My questions have been answered and I understand these instructions. I will adhere to these goals and the provided educational materials after my discharge from the hospital.  Patient/Caregiver Signature _______________________________ Date __________  Clinician Signature _______________________________________ Date __________  Please bring this form and your medication list with you to all your follow-up doctor's appointments.

## 2019-01-05 NOTE — Discharge Summary (Signed)
Physician Discharge Summary  Patient ID: [name] MRN: [id number] DOB/AGE: [birth year] 85 y.o.  Admit date: 12/25/2018 Discharge date: 01/05/2019  Discharge Diagnoses:  Active Problems:   Chronic diastolic CHF (congestive heart failure) (HCC)   PAF (paroxysmal atrial fibrillation) (HCC)   BPH (benign prostatic hyperplasia)   Type 2 diabetes mellitus with vascular disease ([place])   DNR (do not resuscitate) discussion   Stage 3 chronic kidney disease   S/P BKA (below knee amputation) unilateral, right (HCC)   Unilateral complete BKA, left, initial encounter ([place])   Urinary retention due to benign prostatic hyperplasia   Acute blood loss anemia   Abnormal urinalysis   Discharged Condition: stable  Significant Diagnostic Studies: No results found.  Labs:  Basic Metabolic Panel: Recent Labs  Lab 12/30/18 0518 01/04/19 0556  NA 137 136  K 4.0 3.9  CL 103 99  CO2 26 26  GLUCOSE 88 78  BUN 35* 36*  CREATININE 1.15 1.30*  CALCIUM 8.9 8.9    CBC: Recent Labs  Lab 12/29/18 1442 01/04/19 0556 01/05/19 0545  WBC 9.2 8.7 8.2  HGB 9.1* 8.6* 8.8*  HCT 27.0* 26.4* 26.9*  MCV 91.8 94.3 93.4  PLT 363 343 330    CBG: Recent Labs  Lab 01/04/19 1149 01/04/19 1630 01/04/19 2100 01/05/19 0624 01/05/19 1144  GLUCAP 112* 171* 145* 80 93    Brief HPI:   [name] is a 85 y.o. male is an 85 year old male with history of B0JG, PAF, diastolic CHF, PVD s/p R-BKA, osteomyelitis s/p revascularization of distal L-SFA and popliteal artery with left transmetatarsal amputation on 12/02/18 at attempts at limb salvage. He was admitted to CIR on 12/09/18 for rehab but had progressive ischemic changes of wound. Hospital course significant for intermittent urinary retention, fluid overload, leucocytosis with pain. Dr. Sharol Given felt that limb not salvageable therefore patient taken back to OR on 12/04 for L-BKA. Post op ABLA as well as asymptomatic bradycardia being monitored. Therapy  ongoing and CIR recommended to resume therapy program.    Hospital Course: [name] was admitted to rehab 12/25/2018 for inpatient therapies to consist of PT and OT at least three hours five days a week. Past admission physiatrist, therapy team and rehab RN have worked together to provide customized collaborative inpatient rehab.  Blood pressures and Heart rate were monitored on TID basis and is stable overall. Follow up CBC showed ABLA is stable on iron supplment and leucocytosis has resolved. His diabetes has been monitored with ac/hs CBG checks and SSI was use prn for tighter BS control. Blood sugars have been controlled on glipizide bid. Follow up labs showed rise in SCr back to baseline at 1.3.     Right wrist pain and swelling has improved with addition of Voltaren gel qid. No signs of gout noted. He continued to have difficulty voiding and required I/O caths. He developed transient hematuria due to BPH in setting of anticoagulation and traumatic caths.  Urology contacted for input and recommended placing foley to SD and to follow up on outpatient basis. Foley was changed out 12/17 due to clot blocking output. Urine culture 12/13 and 12/15 showed no growth. Hematuria has resolved and repeat UA showed pyuria but patient asymptomatic therefore monitored. Foley was replaced prior to discharge due to reports of decrease in output and small clot noted to be blocking output.   His respiratory status is stable and no signs of overload noted. Abdominal girth has decreased on lasix 40  mg daily and weight is down to 83 kg. Pain is controlled with tylenol qid and prn ultram for break thorough pain.  Vitamin C and protein supplement was added to help promote wound healing. Wound VAC was removed 12/15 and L-BKA incision is C/D/I with staples in place. Prosthetic training ongoing on RLE. He has made gains during rehab stay and is mod to max assist at wheelchair level. He will continue to receive follow up HHPT,  [place] [facility] [place] [facility] [place]  after discharge  Rehab course: During patient's stay in rehab weekly team conferences were held to monitor patient's progress, set goals and discuss barriers to discharge. At admission, patient required total assist with ADL tasks and with mobility. He  has had improvement in activity tolerance, balance, postural control as well as ability to compensate for deficits.  He is able to requires supervision for bed mobility and to facilitate with LB bathing/dressing in bed. She is able to perform SB transfers with min assist after RLE prosthesis donned and mod cues with increase time. He requires +2 assist with sit to stand transfers. Family is able to perform transfers with use of hoyer lift with supervision and min cues. His daughter has been educated on all aspects of care and mobility.   Disposition: Home  Diet: Heart healthy/Carb Modified.  Special Instructions: 1. Perform foley care bid and prn after BM. 2. Offer supplements between meals. Increase fluid intake.    Allergies as of 01/05/2019      Reactions   Iodinated Diagnostic Agents    Other reaction(s): Fever   Dye Fdc Red [red Dye] Hives   Tolerated red Docusate, Niferex   Iodine-131 Other (See Comments)   Breakout, fever   Iohexol     Code: RASH, Desc: PT STATES HOT ALL OVER HAD TO HAVE ICE PACKS ALL OVER HIM AND DIFF BREATHING, PT REFUSED IV DYE      Medication List    STOP taking these medications   Santyl ointment Generic drug: collagenase     TAKE these medications   acetaminophen 500 MG tablet Commonly known as: TYLENOL Take 1 tablet (500 mg total) by mouth 3 (three) times daily. What changed:   medication strength  how much to take  when to take this  reasons to take this   apixaban 5 MG Tabs tablet Commonly known as: ELIQUIS Take 1 tablet (5 mg total) by mouth 2 (two) times daily.   aspirin EC 81 MG tablet Take 81 mg by mouth daily.   atorvastatin 40 MG  tablet Commonly known as: LIPITOR Take 1 tablet (40 mg total) by mouth daily.   clopidogrel 75 MG tablet Commonly known as: PLAVIX Take 1 tablet (75 mg total) by mouth daily with breakfast.   diclofenac Sodium 1 % Gel Commonly known as: VOLTAREN Apply 2 g topically 4 (four) times daily.   docusate sodium 100 MG capsule Commonly known as: COLACE Take 1 capsule (100 mg total) by mouth 2 (two) times daily.   dorzolamide-timolol 22.3-6.8 MG/ML ophthalmic solution Commonly known as: COSOPT Place 1 drop into both eyes 2 (two) times daily.   famotidine 20 MG tablet Commonly known as: PEPCID Take 1 tablet (20 mg total) by mouth daily.   finasteride 5 MG tablet Commonly known as: PROSCAR Take 1 tablet (5 mg total) by mouth every evening.   FISH OIL PO Take 1 capsule by mouth daily.   furosemide 40 MG tablet Commonly known as: LASIX Take 1 tablet (  40 mg total) by mouth daily.   glipiZIDE 5 MG tablet Commonly known as: GLUCOTROL Take 5 mg by mouth 2 (two) times daily.   iron polysaccharides 150 MG capsule Commonly known as: NIFEREX Take 1 capsule (150 mg total) by mouth daily.   levothyroxine 50 MCG tablet Commonly known as: SYNTHROID Take 1 tablet (50 mcg total) by mouth daily before breakfast.   polyethylene glycol 17 g packet Commonly known as: MiraLax Take 17 g by mouth daily as needed for moderate constipation.   potassium chloride 10 MEQ tablet Commonly known as: KLOR-CON Take 1 tablet (10 mEq total) by mouth daily.   senna-docusate 8.6-50 MG tablet Commonly known as: Senokot-S Take 2 tablets by mouth at bedtime. What changed:   how much to take  when to take this  reasons to take this   tamsulosin 0.4 MG Caps capsule Commonly known as: FLOMAX Take 1 capsule (0.4 mg total) by mouth 2 (two) times daily.   traMADol 50 MG tablet--Rx# 24 pills.  Commonly known as: ULTRAM Take 1 tablet (50 mg total) by mouth every 6 (six) hours as needed for severe  pain. What changed: reasons to take this   vitamin B-12 1000 MCG tablet Commonly known as: CYANOCOBALAMIN Take 1 tablet (1,000 mcg total) by mouth daily.   vitamin C 500 MG tablet Commonly known as: ASCORBIC ACID Take 500 mg by mouth daily.      Follow-up Information    Lovorn, Jinny Blossom, MD Follow up.   Specialty: Physical Medicine and Rehabilitation Why: Office will call you with follow up appointment Contact information: [address] [place] [address] [phone]        [name], MD. Call.   Specialty: Orthopedic Surgery Why: for post op appointment Contact information: [place] [address] [phone]        Redmond School, MD. Call on 01/06/2019.   Specialty: Internal Medicine Why: for post hospital follow up Contact information: [address] [place] [address] [phone]        Pixie Casino, MD. Call.   Specialty: Cardiology Why: for post hospital follow up Contact information: [place] [address] [phone]           Signed: Bary Leriche 01/10/2019, 11:44 PM

## 2019-01-05 NOTE — Progress Notes (Signed)
[facility] PHYSICAL MEDICINE & REHABILITATION PROGRESS NOTE   Subjective/Complaints:   Pt reports doing well- ready for discharge. Wrist somewhat better  ROS: Denies CP, SOB, N/V/D  Objective:   No results found. Recent Labs    01/04/19 0556 01/05/19 0545  WBC 8.7 8.2  HGB 8.6* 8.8*  HCT 26.4* 26.9*  PLT 343 330   Recent Labs    01/04/19 0556  NA 136  K 3.9  CL 99  CO2 26  GLUCOSE 78  BUN 36*  CREATININE 1.30*  CALCIUM 8.9    Intake/Output Summary (Last 24 hours) at 01/05/2019 0921 Last data filed at 01/05/2019 0730 Gross per 24 hour  Intake 1080 ml  Output 1825 ml  Net -745 ml     Physical Exam: Vital Signs Blood pressure (!) 144/52, pulse (!) 55, temperature 98 F (36.7 C), temperature source Oral, resp. rate 15, weight 85.4 kg, SpO2 96 %. Constitutional: No distress . Vital signs and labs reviewed. Sitting up in bed; more appropriate, nurse in room, NAD HENT: Normocephalic.  Atraumatic. Eyes: EOMI. No discharge. Cardiovascular: No JVD. Respiratory: Normal effort.  No stridor. GI: Non-distended. Skin: Left BKA with dressing C/D/I Psych: Normal mood.  Normal behavior. Musc: Left BKA with mild edema; mild swelling on top/dorsum of R wrist- no bruising- some ecchymoses close by, but not exactly on top of wrist- mild-moderate TTP; no increased heat/redness Right BKA- older Neuro: Alert HOH Motor: Bilateral upper extremities: 4/5 proximal dista Bilateral lower extremities: Hip flexion, knee extension 4+/5, unchanged  Assessment/Plan: 1. Functional deficits secondary to left BKA which require 3+ hours per day of interdisciplinary therapy in a comprehensive inpatient rehab setting.  Physiatrist is providing close team supervision and 24 hour management of active medical problems listed below.  Physiatrist and rehab team continue to assess barriers to discharge/monitor patient progress toward functional and medical goals  Care Tool:  Bathing     Body parts bathed by patient: Right arm, Left arm, Chest, Abdomen, Front perineal area, Right upper leg, Left upper leg   Body parts bathed by helper: Buttocks Body parts n/a: Right lower leg, Left lower leg   Bathing assist Assist Level: Minimal Assistance - Patient > 75%     Upper Body Dressing/Undressing Upper body dressing   What is the patient wearing?: Pull over shirt    Upper body assist Assist Level: Supervision/Verbal cueing    Lower Body Dressing/Undressing Lower body dressing      What is the patient wearing?: Pants     Lower body assist Assist for lower body dressing: Maximal Assistance - Patient 25 - 49%     Toileting Toileting    Toileting assist Assist for toileting: Total Assistance - Patient < 25%     Transfers Chair/bed transfer  Transfers assist     Chair/bed transfer assist level: Minimal Assistance - Patient > 75% Chair/bed transfer assistive device: Sliding board   Locomotion Ambulation   Ambulation assist   Ambulation activity did not occur: Safety/medical concerns(decreased strength/activity tolerance, B BKA with R prosthesis)          Walk 10 feet activity   Assist  Walk 10 feet activity did not occur: Safety/medical concerns(decreased strength/activity tolerance, B BKA with R prosthesis)        Walk 50 feet activity   Assist Walk 50 feet with 2 turns activity did not occur: Safety/medical concerns(decreased strength/activity tolerance, B BKA with R prosthesis)         Walk 150 feet activity  Assist Walk 150 feet activity did not occur: Safety/medical concerns(decreased strength/activity tolerance, B BKA with R prosthesis)         Walk 10 feet on uneven surface  activity   Assist Walk 10 feet on uneven surfaces activity did not occur: Safety/medical concerns(decreased strength/activity tolerance, B BKA with R prosthesis)         Wheelchair     Assist Will patient use wheelchair at discharge?:  Yes Type of Wheelchair: Manual    Wheelchair assist level: Supervision/Verbal cueing Max wheelchair distance: 155'    Wheelchair 50 feet with 2 turns activity    Assist        Assist Level: Supervision/Verbal cueing   Wheelchair 150 feet activity     Assist      Assist Level: Supervision/Verbal cueing   Blood pressure (!) 144/52, pulse (!) 55, temperature 98 F (36.7 C), temperature source Oral, resp. rate 15, weight 85.4 kg, SpO2 96 %.  Medical Problem List and Plan: 1.  Impaired mobility and ADLs secondary to dehiscence of left amputation stump, left BKA  Continue CIR 2.  Antithrombotics: -DVT/anticoagulation:  Pharmaceutical: Other (comment)--Apixaban             -antiplatelet therapy: ASA/Plavix 3. Pain Management:   -tramadol for moderate pain  -oxycodone 2.5-5mg  for more severe pain  Controlled with medication on 12/21 4. Mood: LCSW to follow for evaluation and support.              -antipsychotic agents: N/A 5. Neuropsych: This patient is capable of making decisions on hs own behalf. 6. Skin/Wound Care: On Vitamin C.  Added prostat to promote wound healing. 7. Fluids/Electrolytes/Nutrition:  Encourage PO  BUN elevated on 12/16, labs ordered for tomorrow  12/21- BUN stable; Cr slightly up to 1.3- usually 1.2-1.3 - still within baseline, it appears;BUN slightly above baseline but stable for hospitalization. 8.  Diastolic CHF: Monitor for signs of overload--abdominal girth has decreased.   Continue Lasix and heart healthy diet.   -daily weights Filed Weights   01/03/19 0422 01/04/19 0500 01/05/19 0500  Weight: 85.9 kg 83 kg 85.4 kg    Dropped 2.9 kg in 1 day- unlikely- will con't to monitor 9. BPH with urinary retention: On Proscar and Flomax. History of UTIs in past 3 months    -OOB to toilet/ BSC to help with emptying.  Encourage Double voids, extra time    -no urecholine d/t cv hx,    -pt already on max flomax dose  Continue Foley, follow-up with  urology outpatient  10. PAF: Monitor HR tid--on Eliquis and atrovastatin 11. ABLA: Continue iron supplement.   Hemoglobin 9.1 on 12/15, labs ordered for tomorrow  Continue to monitor 12. Constipation:    Improving 13. T2DM: Will monitor BS ac/hs. On Glipizide 5 mg bid--use SSI prn.     CBG (last 3)  Recent Labs    01/04/19 1630 01/04/19 2100 01/05/19 0624  GLUCAP 171* 145* 80   Better control 12/22- 14. HLD: Lipitor 40mg  QD 15.  Abnormal UA  Previously ordered collected on 12/19.  Discussed with nursing-asymptomatic at present.  Recent urine culture on 12/15 no growth  See #9  Monitor for now 16. R wrist swelling/tenderness  12/21- will add Voltaren gel QID- scheduled since pt won't ask- doesn't appear CURRENTLY to be gout- will monitor.   12/22- better  LOS: 11 days A FACE TO FACE EVALUATION WAS PERFORMED  Nate Perri 01/05/2019, 9:21 AM

## 2019-01-05 NOTE — Progress Notes (Signed)
Social Work Discharge Note   The overall goal for the admission was met for:   Discharge location: Yes - home with family to provide 24/7 assistance.  Length of Stay: Yes - 11 days  Discharge activity level: Yes - mod / max assist overall  Home/community participation: Yes  Services provided included: MD, RD, PT, OT, RN, Pharmacy and SW  Financial Services: Medicare  Follow-up services arranged: Home Health: RN, PT, OT via [facility], DME: left amputee support pad via [facility] and Patient/Family has no preference for HH/DME agencies  Comments (or additional information):    Contact info:  Thoren, [place] @ [phone]  Patient/Family verbalized understanding of follow-up arrangements: Yes  Individual responsible for coordination of the follow-up plan: pt  Confirmed correct DME delivered: [name] 01/05/2019    Chrisie Jankovich

## 2019-01-06 ENCOUNTER — Telehealth: Payer: Self-pay | Admitting: *Deleted

## 2019-01-06 NOTE — Telephone Encounter (Signed)
1st attempt made to reach Mr Strey 4:22 pm 01[birth year]05[birth year]29  @ home # and sons contact number was unsuccessful. Appointment  Thursday 01/14/19 @ 2:00 arrive by 1:40 to see Danella Sensing NP then to [place] Notified to watch mail for packet with paperwork [place]

## 2019-01-07 DIAGNOSIS — Z89512 Acquired absence of left leg below knee: Secondary | ICD-10-CM | POA: Diagnosis not present

## 2019-01-07 DIAGNOSIS — Z89511 Acquired absence of right leg below knee: Secondary | ICD-10-CM | POA: Diagnosis not present

## 2019-01-09 DIAGNOSIS — E1151 Type 2 diabetes mellitus with diabetic peripheral angiopathy without gangrene: Secondary | ICD-10-CM | POA: Diagnosis not present

## 2019-01-09 DIAGNOSIS — I480 Paroxysmal atrial fibrillation: Secondary | ICD-10-CM | POA: Diagnosis not present

## 2019-01-09 DIAGNOSIS — H409 Unspecified glaucoma: Secondary | ICD-10-CM | POA: Diagnosis not present

## 2019-01-09 DIAGNOSIS — T8781 Dehiscence of amputation stump: Secondary | ICD-10-CM | POA: Diagnosis not present

## 2019-01-09 DIAGNOSIS — Z89512 Acquired absence of left leg below knee: Secondary | ICD-10-CM | POA: Diagnosis not present

## 2019-01-09 DIAGNOSIS — R338 Other retention of urine: Secondary | ICD-10-CM | POA: Diagnosis not present

## 2019-01-09 DIAGNOSIS — M1990 Unspecified osteoarthritis, unspecified site: Secondary | ICD-10-CM | POA: Diagnosis not present

## 2019-01-09 DIAGNOSIS — E1122 Type 2 diabetes mellitus with diabetic chronic kidney disease: Secondary | ICD-10-CM | POA: Diagnosis not present

## 2019-01-09 DIAGNOSIS — I5032 Chronic diastolic (congestive) heart failure: Secondary | ICD-10-CM | POA: Diagnosis not present

## 2019-01-09 DIAGNOSIS — N183 Chronic kidney disease, stage 3 unspecified: Secondary | ICD-10-CM | POA: Diagnosis not present

## 2019-01-09 DIAGNOSIS — E785 Hyperlipidemia, unspecified: Secondary | ICD-10-CM | POA: Diagnosis not present

## 2019-01-09 DIAGNOSIS — I130 Hypertensive heart and chronic kidney disease with heart failure and stage 1 through stage 4 chronic kidney disease, or unspecified chronic kidney disease: Secondary | ICD-10-CM | POA: Diagnosis not present

## 2019-01-11 NOTE — Telephone Encounter (Signed)
Switched to hospital follow up

## 2019-01-12 ENCOUNTER — Other Ambulatory Visit: Payer: Self-pay | Admitting: *Deleted

## 2019-01-12 ENCOUNTER — Encounter: Payer: Self-pay | Admitting: *Deleted

## 2019-01-12 ENCOUNTER — Telehealth: Payer: Self-pay | Admitting: Cardiovascular Disease

## 2019-01-12 DIAGNOSIS — E1122 Type 2 diabetes mellitus with diabetic chronic kidney disease: Secondary | ICD-10-CM | POA: Diagnosis not present

## 2019-01-12 DIAGNOSIS — E785 Hyperlipidemia, unspecified: Secondary | ICD-10-CM | POA: Diagnosis not present

## 2019-01-12 DIAGNOSIS — I130 Hypertensive heart and chronic kidney disease with heart failure and stage 1 through stage 4 chronic kidney disease, or unspecified chronic kidney disease: Secondary | ICD-10-CM | POA: Diagnosis not present

## 2019-01-12 DIAGNOSIS — T8781 Dehiscence of amputation stump: Secondary | ICD-10-CM | POA: Diagnosis not present

## 2019-01-12 DIAGNOSIS — Z89512 Acquired absence of left leg below knee: Secondary | ICD-10-CM | POA: Diagnosis not present

## 2019-01-12 DIAGNOSIS — R338 Other retention of urine: Secondary | ICD-10-CM | POA: Diagnosis not present

## 2019-01-12 DIAGNOSIS — E1151 Type 2 diabetes mellitus with diabetic peripheral angiopathy without gangrene: Secondary | ICD-10-CM | POA: Diagnosis not present

## 2019-01-12 DIAGNOSIS — H409 Unspecified glaucoma: Secondary | ICD-10-CM | POA: Diagnosis not present

## 2019-01-12 DIAGNOSIS — I5032 Chronic diastolic (congestive) heart failure: Secondary | ICD-10-CM | POA: Diagnosis not present

## 2019-01-12 DIAGNOSIS — R3914 Feeling of incomplete bladder emptying: Secondary | ICD-10-CM | POA: Diagnosis not present

## 2019-01-12 DIAGNOSIS — I480 Paroxysmal atrial fibrillation: Secondary | ICD-10-CM | POA: Diagnosis not present

## 2019-01-12 DIAGNOSIS — N183 Chronic kidney disease, stage 3 unspecified: Secondary | ICD-10-CM | POA: Diagnosis not present

## 2019-01-12 DIAGNOSIS — M1990 Unspecified osteoarthritis, unspecified site: Secondary | ICD-10-CM | POA: Diagnosis not present

## 2019-01-12 DIAGNOSIS — Z89511 Acquired absence of right leg below knee: Secondary | ICD-10-CM | POA: Diagnosis not present

## 2019-01-12 NOTE — Telephone Encounter (Signed)
No samples available 

## 2019-01-12 NOTE — Telephone Encounter (Signed)
Returned call and notified kasie that we have no samples available and go to the pharmacy to discuss this with them.

## 2019-01-12 NOTE — Patient Outreach (Signed)
Telephone outreach for red flag on Emmi call: Feeling sad..  Talked with daughter, [name], today. She reports she feels like they pushed the wrong buttons for his father's answers during the call. She says he is not depressed. Advised it would not be unusual after having back to back BKAs. If his mood does seem to be down, please talk to me or his MD about this for help.  Introduced [place] and [name] agrees that they would appreciate the support.  We discussed her father's safety with the changes in his mobility. She reports his glucose control is quite good. He is unable to weigh daily for HF management but can monitor for increased SOB, abdominal distention, cough.  Patient was recently discharged from hospital and all medications have been reviewed. Outpatient Encounter Medications as of 01/12/2019  Medication Sig  . acetaminophen (TYLENOL) 500 MG tablet Take 1 tablet (500 mg total) by mouth 3 (three) times daily.  [name] apixaban (ELIQUIS) 5 MG TABS tablet Take 1 tablet (5 mg total) by mouth 2 (two) times daily.  [name] aspirin EC 81 MG tablet Take 81 mg by mouth daily.  [name] atorvastatin (LIPITOR) 40 MG tablet Take 1 tablet (40 mg total) by mouth daily.  . clopidogrel (PLAVIX) 75 MG tablet Take 1 tablet (75 mg total) by mouth daily with breakfast.  . diclofenac Sodium (VOLTAREN) 1 % GEL Apply 2 g topically 4 (four) times daily.  [name] docusate sodium (COLACE) 100 MG capsule Take 1 capsule (100 mg total) by mouth 2 (two) times daily.  . dorzolamide-timolol (COSOPT) 22.3-6.8 MG/ML ophthalmic solution Place 1 drop into both eyes 2 (two) times daily.  . famotidine (PEPCID) 20 MG tablet Take 1 tablet (20 mg total) by mouth daily.  . finasteride (PROSCAR) 5 MG tablet Take 1 tablet (5 mg total) by mouth every evening.  . furosemide (LASIX) 40 MG tablet Take 1 tablet (40 mg total) by mouth daily.  [name] glipiZIDE (GLUCOTROL) 5 MG tablet Take 5 mg by mouth 2 (two) times daily.   . iron  polysaccharides (NIFEREX) 150 MG capsule Take 1 capsule (150 mg total) by mouth daily.  [name] levothyroxine (SYNTHROID) 50 MCG tablet Take 1 tablet (50 mcg total) by mouth daily before breakfast.  . Omega-3 Fatty Acids (FISH OIL PO) Take 1 capsule by mouth daily.  . polyethylene glycol (MIRALAX) 17 g packet Take 17 g by mouth daily as needed for moderate constipation.  . potassium chloride (KLOR-CON) 10 MEQ tablet Take 1 tablet (10 mEq total) by mouth daily.  [name] senna-docusate (SENOKOT-S) 8.6-50 MG tablet Take 2 tablets by mouth at bedtime.  . tamsulosin (FLOMAX) 0.4 MG CAPS capsule Take 1 capsule (0.4 mg total) by mouth 2 (two) times daily.  . traMADol (ULTRAM) 50 MG tablet Take 1 tablet (50 mg total) by mouth every 6 (six) hours as needed for severe pain.  . vitamin B-12 (CYANOCOBALAMIN) 1000 MCG tablet Take 1 tablet (1,000 mcg total) by mouth daily.  . vitamin C (ASCORBIC ACID) 500 MG tablet Take 500 mg by mouth daily.   No facility-administered encounter medications on file as of 01/12/2019.   Fall Risk  01/12/2019 10/05/2018  Falls in the past year? 1 0  Number falls in past yr: 1 -  Injury with Fall? 0 -  Risk for fall due to : History of fall(s);Medication side effect;Orthopedic patient -  Risk for fall due to: Comment Recent bilat BKAs. -  Follow up Falls evaluation completed;Falls prevention discussed -  Depression screen PHQ 2/9 01/12/2019  Decreased Interest 1  Down, Depressed, Hopeless 1  PHQ - 2 Score 2  Altered sleeping 0  Tired, decreased energy 1  Change in appetite 0  Feeling bad or failure about yourself  0  Trouble concentrating 0  Moving slowly or fidgety/restless 0  Suicidal thoughts 0  PHQ-9 Score 3  Difficult doing work/chores Not difficult at all   [facility] CM Care Plan Problem One     Most Recent Value  Care Plan Problem One  Diabetes with PVD, recent bilat BKAs.  Role Documenting the Problem One  Care Management [place] for Problem One  Active   [facility] Long Term Goal   Pt will not have complications to his surgical sites due to uncontolled glucose levels over the next 60 days as observed by family and glucose record.  [facility]N Long Term Goal Start Date  01/12/19  Interventions for Problem One Long Term Goal  Continue daily glucose checks. Observe and treat wounds with recommended care. Advise MD for any signs of infection. Keep legs elevated.    [facility] CM Care Plan Problem Two     Most Recent Value  Care Plan Problem Two  Reduced mobility and independence due to BKAs  Role Documenting the Problem Two  Care Management [place] for Problem Two  Active  Interventions for Problem Two Long Term Goal   Reinforced safety is high priority for Mr. Brammer to avoid complications. Caution with transfers. Participate fully in PT. Arrange for BR remodel. At least grab bars.  THN Long Term Goal  Pt will not sustain any injury requiring ED visit due to fall or other safety issue over the next 60 days.  [facility]N Long Term Goal Start Date  01/12/19  [facility] CM Short Term Goal #1   Pt will participate in PT over the next 30 days.  [facility] CM Short Term Goal #1 Start Date  01/12/19  Interventions for Short Term Goal #2   Encouraged 100% effort during his therapy sessions.     I will call weekly over the next month.  Eulah Pont. Myrtie Neither, MSN, [facility] Gerontological Nurse Practitioner [facility] Care Management [phone]

## 2019-01-12 NOTE — Telephone Encounter (Signed)
Granddaughter of the patient calling the office for samples of medication:   1.  What medication and dosage are you requesting samples for? apixaban (ELIQUIS) 5 MG TABS tablet  2.  Are you currently out of this medication? Yes  Patient is in the donut hole and will not have insurance coverage until after January 1

## 2019-01-13 ENCOUNTER — Other Ambulatory Visit: Payer: Self-pay

## 2019-01-13 ENCOUNTER — Ambulatory Visit (INDEPENDENT_AMBULATORY_CARE_PROVIDER_SITE_OTHER): Payer: Medicare Other | Admitting: Family

## 2019-01-13 ENCOUNTER — Encounter: Payer: Medicare Other | Attending: Registered Nurse | Admitting: Registered Nurse

## 2019-01-13 ENCOUNTER — Encounter: Payer: Self-pay | Admitting: Family

## 2019-01-13 ENCOUNTER — Telehealth: Payer: Self-pay | Admitting: Orthopedic Surgery

## 2019-01-13 ENCOUNTER — Encounter: Payer: Self-pay | Admitting: Registered Nurse

## 2019-01-13 ENCOUNTER — Inpatient Hospital Stay (HOSPITAL_COMMUNITY): Payer: Medicare Other | Admitting: Occupational Therapy

## 2019-01-13 VITALS — Ht 71.0 in | Wt 188.0 lb

## 2019-01-13 VITALS — BP 159/79 | HR 60 | Temp 97.8°F | Ht 71.0 in | Wt 188.0 lb

## 2019-01-13 DIAGNOSIS — Z89512 Acquired absence of left leg below knee: Secondary | ICD-10-CM

## 2019-01-13 DIAGNOSIS — I5032 Chronic diastolic (congestive) heart failure: Secondary | ICD-10-CM | POA: Diagnosis not present

## 2019-01-13 DIAGNOSIS — S88112A Complete traumatic amputation at level between knee and ankle, left lower leg, initial encounter: Secondary | ICD-10-CM | POA: Diagnosis not present

## 2019-01-13 DIAGNOSIS — Z89511 Acquired absence of right leg below knee: Secondary | ICD-10-CM | POA: Diagnosis not present

## 2019-01-13 DIAGNOSIS — I480 Paroxysmal atrial fibrillation: Secondary | ICD-10-CM | POA: Diagnosis not present

## 2019-01-13 NOTE — Telephone Encounter (Signed)
Called and sw Magda Paganini to advise verbal ok for social work eval for pt s/p Left BKA 12/18/18

## 2019-01-13 NOTE — Progress Notes (Signed)
Subjective:    Patient ID: [name], male    DOB: 01-22-1927, 85 y.o.   MRN: [id number]  HPI: [name] is a 85 y.o. male who is here for hospital follow up of his unilateral complete BKA left, Tight BKA, PAF  and Chronic Diastolic CHF. He has a history of osteomyelitis s/p revascularization of distal L-SFA and popliteal artery with left transmetatarsal amputation n 211/18/2020 with attempt for limb salvage. Dr. Sharol Given felt limb not salvageable therefor he was taken back to the OR on 12/18/2018 for Left BKA by Dr. Sharol Given.   He was admitted to inpatient rehabilitation on 12/25/2018 and discharged home on 01/05/2019. He is receiving outpatient therapy with [facility]. Also reports he has a good appetite.   He denies any pain at this time. He rates his pain 0.. He arrived in wheelchair.   Daughter in room, all questions answered.     Pain Inventory Average Pain 0 Pain Right Now 0 My pain is na  In the last 24 hours, has pain interfered with the following? General activity 0 Relation with others 0 Enjoyment of life 0 What TIME of day is your pain at its worst? night Sleep (in general) Good  Pain is worse with: standing Pain improves with: rest and medication Relief from Meds: na  Mobility ability to climb steps?  no do you drive?  no  Function retired I need assistance with the following:  dressing, bathing, toileting, meal prep, household duties and shopping  Neuro/Psych bladder control problems weakness  Prior Studies Any changes since last visit?  no  Physicians involved in your care Any changes since last visit?  no   Family History  Problem Relation Age of Onset  . Cancer Mother   . Heart attack Father   . Stroke Father   . Heart attack Brother   . Heart attack Brother    Social History   Socioeconomic History  . Marital status: Widowed    Spouse name: Not on file  . Number of children: Not on file  . Years of education: Not on file  .  Highest education level: Not on file  Occupational History  . Not on file  Tobacco Use  . Smoking status: Former Smoker    Quit date: 01/13/1965    Years since quitting: 54.0  . Smokeless tobacco: Former Systems developer    Types: Chew  Substance and Sexual Activity  . Alcohol use: No  . Drug use: No  . Sexual activity: Not Currently  Other Topics Concern  . Not on file  Social History Narrative  . Not on file   Social Determinants of Health   Financial Resource Strain: Low Risk   . Difficulty of Paying Living Expenses: Not hard at all  Food Insecurity: No Food Insecurity  . Worried About Charity fundraiser in the Last Year: Never true  . Ran Out of Food in the Last Year: Never true  Transportation Needs: No Transportation Needs  . Lack of Transportation (Medical): No  . Lack of Transportation (Non-Medical): No  Physical Activity: Inactive  . Days of Exercise per Week: 0 days  . Minutes of Exercise per Session: 0 min  Stress: Stress Concern Present  . Feeling of Stress : To some extent  Social Connections: Moderately Isolated  . Frequency of Communication with Friends and Family: More than three times a week  . Frequency of Social Gatherings with Friends and Family: More than three times  a week  . Attends Religious Services: Never  . Active Member of Clubs or Organizations: No  . Attends Archivist Meetings: Never  . Marital Status: Widowed   Past Surgical History:  Procedure Laterality Date  . ABDOMINAL AORTOGRAM W/LOWER EXTREMITY N/A 07/27/2018   Procedure: ABDOMINAL AORTOGRAM W/LOWER EXTREMITY;  Surgeon: Lorretta Harp, MD;  Location: [facility];  Service: Cardiovascular;  Laterality: N/A;  . AMPUTATION Right 08/28/2018   Procedure: RIGHT BELOW KNEE AMPUTATION;  Surgeon: [name], MD;  Location: [facility];  Service: Orthopedics;  Laterality: Right;  . AMPUTATION Left 12/02/2018   Procedure: LEFT TRANSMETATARSAL AMPUTATION AND NEGATIVE PRESSURE WOUND VAC  PLACEMENT;  Surgeon: [name], MD;  Location: [facility];  Service: Orthopedics;  Laterality: Left;  . AMPUTATION Left 12/18/2018   Procedure: LEFT BELOW KNEE AMPUTATION;  Surgeon: [name], MD;  Location: [place];  Service: Orthopedics;  Laterality: Left;  . APPENDECTOMY  1943  . BELOW KNEE LEG AMPUTATION Left 12/18/2018  . CATARACT EXTRACTION  2012   x2  . COLONOSCOPY N/A 11/01/2014   Procedure: COLONOSCOPY;  Surgeon: Aviva Signs, MD;  [facility];  Service: Gastroenterology;  Laterality: N/A;  . ESOPHAGOGASTRODUODENOSCOPY N/A 11/01/2014   Procedure: ESOPHAGOGASTRODUODENOSCOPY (EGD);  Surgeon: Aviva Signs, MD;  [facility];  Service: Gastroenterology;  Laterality: N/A;  . HERNIA REPAIR Left   . INGUINAL HERNIA REPAIR Right 03/01/2013   Procedure: RIGHT INGUINAL HERNIORRHAPHY;  Surgeon: Jamesetta So, MD;  [facility];  Service: General;  Laterality: Right;  . INSERTION OF MESH Right 03/01/2013   Procedure: INSERTION OF MESH;  Surgeon: Jamesetta So, MD;  [facility];  Service: General;  Laterality: Right;  . LOWER EXTREMITY ANGIOGRAPHY Right 08/25/2018   Procedure: LOWER EXTREMITY ANGIOGRAPHY;  Surgeon: Wellington Hampshire, MD;  Location: [facility];  Service: Cardiovascular;  Laterality: Right;  . LOWER EXTREMITY ANGIOGRAPHY N/A 11/25/2018   Procedure: LOWER EXTREMITY ANGIOGRAPHY;  Surgeon: Wellington Hampshire, MD;  Location: [facility];  Service: Cardiovascular;  Laterality: N/A;  . NM MYOCAR PERF WALL MOTION  06/05/2010   no significant ischemia  . PERIPHERAL VASCULAR ATHERECTOMY Right 08/03/2018   Procedure: PERIPHERAL VASCULAR ATHERECTOMY;  Surgeon: Lorretta Harp, MD;  Location: [facility];  Service: Cardiovascular;  Laterality: Right;  . PERIPHERAL VASCULAR ATHERECTOMY Left 11/23/2018   Procedure: PERIPHERAL VASCULAR ATHERECTOMY;  Surgeon: Lorretta Harp, MD;  Location: [facility];  Service: Cardiovascular;   Laterality: Left;  sfa with dc balloon  . PERIPHERAL VASCULAR BALLOON ANGIOPLASTY Left 11/25/2018   Procedure: PERIPHERAL VASCULAR BALLOON ANGIOPLASTY;  Surgeon: Wellington Hampshire, MD;  Location: [facility];  Service: Cardiovascular;  Laterality: Left;  popliteal  . PERIPHERAL VASCULAR INTERVENTION Left 11/25/2018   Procedure: PERIPHERAL VASCULAR INTERVENTION;  Surgeon: Wellington Hampshire, MD;  Location: [facility];  Service: Cardiovascular;  Laterality: Left;  tibial peroneal trunk and peroneal stents   . stent  06/14/2010   left leg  . US ECHOCARDIOGRAPHY  01/21/2006   moderate mitral annular ca+, mild MR, AOV moderately sclerotic.   Past Medical History:  Diagnosis Date  . Arthritis   . Borderline diabetic   . Diabetes mellitus without complication ([place])   . Glaucoma   . Hyperlipidemia   . Hypertension   . PVD (peripheral vascular disease) ([place])   . Sleep apnea    Cpap ordered but doesnt use  There were no vitals taken for this visit.  Opioid Risk Score:   Fall Risk Score:  `1  Depression screen PHQ 2/9  Depression screen PHQ 2/9 01/12/2019  Decreased Interest 1  Down, Depressed, Hopeless 1  PHQ - 2 Score 2  Altered sleeping 0  Tired, decreased energy 1  Change in appetite 0  Feeling bad or failure about yourself  0  Trouble concentrating 0  Moving slowly or fidgety/restless 0  Suicidal thoughts 0  PHQ-9 Score 3  Difficult doing work/chores Not difficult at all     Review of Systems  Constitutional: Negative.   HENT: Negative.   Eyes: Negative.   Respiratory: Negative.   Cardiovascular: Negative.   Gastrointestinal: Negative.   Endocrine: Negative.   Genitourinary: Positive for difficulty urinating.  Musculoskeletal: Negative.   Skin: Negative.   Allergic/Immunologic: Negative.   Neurological: Positive for weakness.  Hematological: Bruises/bleeds easily.  Psychiatric/Behavioral: Negative.   All other systems reviewed and are  negative.      Objective:   Physical Exam Vitals and nursing note reviewed.  Constitutional:      Appearance: Normal appearance.  Cardiovascular:     Rate and Rhythm: Normal rate and regular rhythm.     Pulses: Normal pulses.     Heart sounds: Normal heart sounds.  Pulmonary:     Effort: Pulmonary effort is normal.     Breath sounds: Normal breath sounds.  Musculoskeletal:     Cervical back: Normal range of motion and neck supple.     Comments: Normal Muscle Bulk and Muscle Testing Reveals:  Upper Extremities: Full ROM and Muscle Strength 4/5  Lower Extremities: Right BKA wearing Prosthesis Left: BKA: Staples OTA: Site cleansed : Wearing limb Guard Arrived in wheelchair  Skin:    General: Skin is warm and dry.  Neurological:     Mental Status: He is alert and oriented to person, place, and time.  Psychiatric:        Mood and Affect: Mood normal.        Behavior: Behavior normal.           Assessment & Plan:  1.Unilateral complete BKA left: Dr. Sharol Given Following: Wearing Limb Guard: Home Health Following: [place].  2. Right BKA: Wearing Prosthesis: Continue to Monitor 3. PAF: Continue current medication regimen. Cardiology Following. Left message on  Daughter voicemail on 01/13/2019 after speaking with PA regarding Mr. Nimmons Eliquis.  She was Instructed  to F/U with Cardiology regarding  Eliquis. Spoke with Mr. [place] daughter today 01/14/2019, she states she has been in contact with cardiology.  4. Chronic Diastolic CHF.Cardiology Following. Continue to monitor.   30 minutes of face to face patient care time was spent during this visit. All questions were encouraged and answered.  F/U in 4- 6 weeks with Dr. Dagoberto Ligas.

## 2019-01-13 NOTE — Progress Notes (Signed)
Post-Op Visit Note   Patient: [name]           Date of Birth: [birth year]           MRN: [id number] Visit Date: 01/13/2019 PCP: Redmond School, MD  Chief Complaint:  Chief Complaint  Patient presents with  . Left Leg - Routine Post Op    12/18/18 left BKA     HPI:  HPI The patient is an 85 year old gentleman seen today status post left below-knee amputation on December 4 he is in 3 XL shrinker in a limb protector his family accompanies the visit.  He does have some swelling he is concerned about healing Ortho Exam On examination staples are in place there is moderate swelling pitting edema to his residual limb there are some necrotic changes to the medial aspect of the incision about 3 cm in length there is an ulcer to the area this is not open or weeping.  The incision is healing well in the center.  Laterally there is an area of an exposed absorbable suture this was removed today with pickups and a scalpel.  There is no purulence no sign of infection  Visit Diagnoses:  1. S/P bilateral BKA (below knee amputation) ([place])     Plan: Continue daily Dial soap cleansing.  Dry dressing changes.  He will wear the shrinker with direct skin contact we will have him wear an Ace wrap for additional compression.  He is following with [facility] for his prosthesis needs.  He will follow-up in 1 week for staple removal  Follow-Up Instructions: Return in about 10 years (around 01/12/2029).   Imaging: No results found.  Orders:  No orders of the defined types were placed in this encounter.  No orders of the defined types were placed in this encounter.    PMFS History: Patient Active Problem List   Diagnosis Date Noted  . Abnormal urinalysis   . Labile blood glucose   . Urinary retention   . S/P bilateral BKA (below knee amputation) ([place])   . Acute blood loss anemia   . Urinary retention due to benign prostatic hyperplasia 12/28/2018  . Unilateral complete BKA, left, initial  encounter ([place]) 12/25/2018  . Dehiscence of amputation stump ([place])   . History of transmetatarsal amputation of left foot ([place]) 12/09/2018  . Critical limb ischemia with history of revascularization of same extremity   . Gangrene of left foot ([place])   . Diabetic ulcer of toe of left foot associated with type 2 diabetes mellitus ([place])   . Cellulitis of left lower extremity   . Toe infection 11/18/2018  . Peripheral edema   . Acute on chronic diastolic CHF (congestive heart failure) ([place]) 11/09/2018  . S/P BKA (below knee amputation) unilateral, right ([place]) 10/05/2018  . Hyponatremia   . Essential hypertension   . Postoperative pain   . Pain of left heel   . Unable to maintain weight-bearing   . Type 2 diabetes mellitus with diabetic peripheral angiopathy with gangrene ([place])   . Anemia due to acute blood loss   . Stage 3 chronic kidney disease   . Acquired absence of right leg below knee ([place]) 09/07/2018  . Gangrene of right foot ([place])   . DNR (do not resuscitate) discussion   . Goals of care, counseling/discussion   . Palliative care by specialist   . Type 2 diabetes mellitus with vascular disease ([place])   . Severe protein-calorie malnutrition ([place])   . Ischemic  foot 08/20/2018  . Critical lower limb ischemia 08/03/2018  . Type 2 diabetes mellitus with foot ulcer, without long-term current use of insulin ([place])   . Gastroesophageal reflux disease   . Cellulitis of great toe of right foot 05/29/2018  . Atrial fibrillation, chronic   . Chest pain 07/25/2017  . PAF (paroxysmal atrial fibrillation) ([place]) 07/25/2017  . BPH (benign prostatic hyperplasia) 07/25/2017  . Shortness of breath 05/11/2017  . Hypothyroidism 05/11/2017  . Chronic diastolic CHF (congestive heart failure) ([place]) 05/11/2017  . Moderate aortic stenosis 06/12/2016  . RBBB 06/12/2016  . Peripheral vascular disease ([place]) 08/04/2012  . Essential hypertension, benign 08/04/2012  . Hyperlipidemia 08/04/2012  . Dizziness  08/04/2012   Past Medical History:  Diagnosis Date  . Arthritis   . Borderline diabetic   . Diabetes mellitus without complication ([place])   . Glaucoma   . Hyperlipidemia   . Hypertension   . PVD (peripheral vascular disease) ([place])   . Sleep apnea    Cpap ordered but doesnt use    Family History  Problem Relation Age of Onset  . Cancer Mother   . Heart attack Father   . Stroke Father   . Heart attack Brother   . Heart attack Brother     Past Surgical History:  Procedure Laterality Date  . ABDOMINAL AORTOGRAM W/LOWER EXTREMITY N/A 07/27/2018   Procedure: ABDOMINAL AORTOGRAM W/LOWER EXTREMITY;  Surgeon: Lorretta Harp, MD;  Location: [facility];  Service: Cardiovascular;  Laterality: N/A;  . AMPUTATION Right 08/28/2018   Procedure: RIGHT BELOW KNEE AMPUTATION;  Surgeon: [name], MD;  Location: [facility];  Service: Orthopedics;  Laterality: Right;  . AMPUTATION Left 12/02/2018   Procedure: LEFT TRANSMETATARSAL AMPUTATION AND NEGATIVE PRESSURE WOUND VAC PLACEMENT;  Surgeon: [name], MD;  Location: [facility];  Service: Orthopedics;  Laterality: Left;  . AMPUTATION Left 12/18/2018   Procedure: LEFT BELOW KNEE AMPUTATION;  Surgeon: [name], MD;  Location: [facility];  Service: Orthopedics;  Laterality: Left;  . APPENDECTOMY  1943  . BELOW KNEE LEG AMPUTATION Left 12/18/2018  . CATARACT EXTRACTION  2012   x2  . COLONOSCOPY N/A 11/01/2014   Procedure: COLONOSCOPY;  Surgeon: Aviva Signs, MD;  [facility];  Service: Gastroenterology;  Laterality: N/A;  . ESOPHAGOGASTRODUODENOSCOPY N/A 11/01/2014   Procedure: ESOPHAGOGASTRODUODENOSCOPY (EGD);  Surgeon: Aviva Signs, MD;  [facility];  Service: Gastroenterology;  Laterality: N/A;  . HERNIA REPAIR Left   . INGUINAL HERNIA REPAIR Right 03/01/2013   Procedure: RIGHT INGUINAL HERNIORRHAPHY;  Surgeon: Jamesetta So, MD;  [facility];  Service: General;  Laterality: Right;  . INSERTION OF MESH Right  03/01/2013   Procedure: INSERTION OF MESH;  Surgeon: Jamesetta So, MD;  [facility];  Service: General;  Laterality: Right;  . LOWER EXTREMITY ANGIOGRAPHY Right 08/25/2018   Procedure: LOWER EXTREMITY ANGIOGRAPHY;  Surgeon: Wellington Hampshire, MD;  Location: [facility];  Service: Cardiovascular;  Laterality: Right;  . LOWER EXTREMITY ANGIOGRAPHY N/A 11/25/2018   Procedure: LOWER EXTREMITY ANGIOGRAPHY;  Surgeon: Wellington Hampshire, MD;  Location: [facility];  Service: Cardiovascular;  Laterality: N/A;  . NM MYOCAR PERF WALL MOTION  06/05/2010   no significant ischemia  . PERIPHERAL VASCULAR ATHERECTOMY Right 08/03/2018   Procedure: PERIPHERAL VASCULAR ATHERECTOMY;  Surgeon: Lorretta Harp, MD;  Location: [facility];  Service: Cardiovascular;  Laterality: Right;  . PERIPHERAL VASCULAR ATHERECTOMY Left 11/23/2018  Procedure: PERIPHERAL VASCULAR ATHERECTOMY;  Surgeon: Lorretta Harp, MD;  Location: [facility];  Service: Cardiovascular;  Laterality: Left;  sfa with dc balloon  . PERIPHERAL VASCULAR BALLOON ANGIOPLASTY Left 11/25/2018   Procedure: PERIPHERAL VASCULAR BALLOON ANGIOPLASTY;  Surgeon: Wellington Hampshire, MD;  Location: [facility];  Service: Cardiovascular;  Laterality: Left;  popliteal  . PERIPHERAL VASCULAR INTERVENTION Left 11/25/2018   Procedure: PERIPHERAL VASCULAR INTERVENTION;  Surgeon: Wellington Hampshire, MD;  Location: [facility];  Service: Cardiovascular;  Laterality: Left;  tibial peroneal trunk and peroneal stents   . stent  06/14/2010   left leg  . US ECHOCARDIOGRAPHY  01/21/2006   moderate mitral annular ca+, mild MR, AOV moderately sclerotic.   Social History   Occupational History  . Not on file  Tobacco Use  . Smoking status: Former Smoker    Quit date: 01/13/1965    Years since quitting: 54.0  . Smokeless tobacco: Former Systems developer    Types: Chew  Substance and Sexual Activity  . Alcohol use: No  . Drug use: No  .  Sexual activity: Not Currently

## 2019-01-13 NOTE — Telephone Encounter (Signed)
[facility]da Paganini with [facility] called in requesting a verbal order to get a social worker to come out and see the pt due to him requesting it.    [phone]

## 2019-01-14 ENCOUNTER — Encounter: Payer: Medicare Other | Admitting: Registered Nurse

## 2019-01-14 ENCOUNTER — Telehealth: Payer: Self-pay | Admitting: Orthopedic Surgery

## 2019-01-14 ENCOUNTER — Telehealth: Payer: Self-pay

## 2019-01-14 DIAGNOSIS — M1990 Unspecified osteoarthritis, unspecified site: Secondary | ICD-10-CM | POA: Diagnosis not present

## 2019-01-14 DIAGNOSIS — I130 Hypertensive heart and chronic kidney disease with heart failure and stage 1 through stage 4 chronic kidney disease, or unspecified chronic kidney disease: Secondary | ICD-10-CM | POA: Diagnosis not present

## 2019-01-14 DIAGNOSIS — E785 Hyperlipidemia, unspecified: Secondary | ICD-10-CM | POA: Diagnosis not present

## 2019-01-14 DIAGNOSIS — R338 Other retention of urine: Secondary | ICD-10-CM | POA: Diagnosis not present

## 2019-01-14 DIAGNOSIS — I5032 Chronic diastolic (congestive) heart failure: Secondary | ICD-10-CM | POA: Diagnosis not present

## 2019-01-14 DIAGNOSIS — E1151 Type 2 diabetes mellitus with diabetic peripheral angiopathy without gangrene: Secondary | ICD-10-CM | POA: Diagnosis not present

## 2019-01-14 DIAGNOSIS — Z89512 Acquired absence of left leg below knee: Secondary | ICD-10-CM | POA: Diagnosis not present

## 2019-01-14 DIAGNOSIS — H409 Unspecified glaucoma: Secondary | ICD-10-CM | POA: Diagnosis not present

## 2019-01-14 DIAGNOSIS — E1122 Type 2 diabetes mellitus with diabetic chronic kidney disease: Secondary | ICD-10-CM | POA: Diagnosis not present

## 2019-01-14 DIAGNOSIS — N183 Chronic kidney disease, stage 3 unspecified: Secondary | ICD-10-CM | POA: Diagnosis not present

## 2019-01-14 DIAGNOSIS — T8781 Dehiscence of amputation stump: Secondary | ICD-10-CM | POA: Diagnosis not present

## 2019-01-14 DIAGNOSIS — I480 Paroxysmal atrial fibrillation: Secondary | ICD-10-CM | POA: Diagnosis not present

## 2019-01-14 NOTE — Telephone Encounter (Signed)
Patients daughter called requesting advice on Eliquis. Patient is currently on 2.5MG , 1 tablet by mouth twice a day. Patient was recently in the hospital and needed to have an amputation and the hospital wanted to increase Eliquis to 5MG , 1 tablet by mouth twice a day. Patients daughter is wanting to know if Dr. Debara Pickett wants patient to increase the Eliquis?

## 2019-01-14 NOTE — Telephone Encounter (Signed)
[facility] called. Patient has a rash on his right BKA and they think its coming from the shrinker. Wants to know if you guys have suggestions.  Call back number: [phone]

## 2019-01-14 NOTE — Telephone Encounter (Signed)
Patient [facility] was called and per Dr Sharol Given suggested for patient to wear his black shrinker next to skin and then apply silicone over that . Janett Billow stated she understood orders and to also pull down shrinker above knee to keep silicone on for prosthesis.

## 2019-01-17 ENCOUNTER — Other Ambulatory Visit: Payer: Self-pay

## 2019-01-17 ENCOUNTER — Inpatient Hospital Stay (HOSPITAL_COMMUNITY)
Admission: EM | Admit: 2019-01-17 | Discharge: 2019-01-22 | DRG: 493 | Disposition: A | Discharge status: Home Health | Payer: Medicare Other | Attending: Internal Medicine | Admitting: Internal Medicine

## 2019-01-17 ENCOUNTER — Encounter (HOSPITAL_COMMUNITY): Payer: Self-pay | Admitting: *Deleted

## 2019-01-17 DIAGNOSIS — K219 Gastro-esophageal reflux disease without esophagitis: Secondary | ICD-10-CM | POA: Diagnosis present

## 2019-01-17 DIAGNOSIS — I480 Paroxysmal atrial fibrillation: Secondary | ICD-10-CM | POA: Diagnosis not present

## 2019-01-17 DIAGNOSIS — Z03818 Encounter for observation for suspected exposure to other biological agents ruled out: Secondary | ICD-10-CM | POA: Diagnosis not present

## 2019-01-17 DIAGNOSIS — E1152 Type 2 diabetes mellitus with diabetic peripheral angiopathy with gangrene: Secondary | ICD-10-CM | POA: Diagnosis not present

## 2019-01-17 DIAGNOSIS — Z79890 Hormone replacement therapy (postmenopausal): Secondary | ICD-10-CM

## 2019-01-17 DIAGNOSIS — I96 Gangrene, not elsewhere classified: Secondary | ICD-10-CM | POA: Diagnosis present

## 2019-01-17 DIAGNOSIS — H409 Unspecified glaucoma: Secondary | ICD-10-CM | POA: Diagnosis present

## 2019-01-17 DIAGNOSIS — Z7984 Long term (current) use of oral hypoglycemic drugs: Secondary | ICD-10-CM | POA: Diagnosis not present

## 2019-01-17 DIAGNOSIS — T8754 Necrosis of amputation stump, left lower extremity: Principal | ICD-10-CM | POA: Diagnosis present

## 2019-01-17 DIAGNOSIS — Z8249 Family history of ischemic heart disease and other diseases of the circulatory system: Secondary | ICD-10-CM | POA: Diagnosis not present

## 2019-01-17 DIAGNOSIS — I130 Hypertensive heart and chronic kidney disease with heart failure and stage 1 through stage 4 chronic kidney disease, or unspecified chronic kidney disease: Secondary | ICD-10-CM | POA: Diagnosis present

## 2019-01-17 DIAGNOSIS — Z7902 Long term (current) use of antithrombotics/antiplatelets: Secondary | ICD-10-CM | POA: Diagnosis not present

## 2019-01-17 DIAGNOSIS — T8781 Dehiscence of amputation stump: Secondary | ICD-10-CM | POA: Diagnosis not present

## 2019-01-17 DIAGNOSIS — L0390 Cellulitis, unspecified: Secondary | ICD-10-CM | POA: Diagnosis present

## 2019-01-17 DIAGNOSIS — Z91041 Radiographic dye allergy status: Secondary | ICD-10-CM | POA: Diagnosis not present

## 2019-01-17 DIAGNOSIS — D509 Iron deficiency anemia, unspecified: Secondary | ICD-10-CM | POA: Diagnosis present

## 2019-01-17 DIAGNOSIS — N1831 Chronic kidney disease, stage 3a: Secondary | ICD-10-CM | POA: Diagnosis present

## 2019-01-17 DIAGNOSIS — Z87891 Personal history of nicotine dependence: Secondary | ICD-10-CM

## 2019-01-17 DIAGNOSIS — E1122 Type 2 diabetes mellitus with diabetic chronic kidney disease: Secondary | ICD-10-CM | POA: Diagnosis present

## 2019-01-17 DIAGNOSIS — Z7189 Other specified counseling: Secondary | ICD-10-CM

## 2019-01-17 DIAGNOSIS — T8149XA Infection following a procedure, other surgical site, initial encounter: Secondary | ICD-10-CM

## 2019-01-17 DIAGNOSIS — R338 Other retention of urine: Secondary | ICD-10-CM | POA: Diagnosis present

## 2019-01-17 DIAGNOSIS — D62 Acute posthemorrhagic anemia: Secondary | ICD-10-CM | POA: Diagnosis not present

## 2019-01-17 DIAGNOSIS — I5032 Chronic diastolic (congestive) heart failure: Secondary | ICD-10-CM | POA: Diagnosis present

## 2019-01-17 DIAGNOSIS — Z809 Family history of malignant neoplasm, unspecified: Secondary | ICD-10-CM | POA: Diagnosis not present

## 2019-01-17 DIAGNOSIS — Z89511 Acquired absence of right leg below knee: Secondary | ICD-10-CM | POA: Diagnosis not present

## 2019-01-17 DIAGNOSIS — I350 Nonrheumatic aortic (valve) stenosis: Secondary | ICD-10-CM

## 2019-01-17 DIAGNOSIS — T8142XA Infection following a procedure, deep incisional surgical site, initial encounter: Secondary | ICD-10-CM | POA: Diagnosis not present

## 2019-01-17 DIAGNOSIS — N184 Chronic kidney disease, stage 4 (severe): Secondary | ICD-10-CM | POA: Diagnosis present

## 2019-01-17 DIAGNOSIS — E785 Hyperlipidemia, unspecified: Secondary | ICD-10-CM | POA: Diagnosis not present

## 2019-01-17 DIAGNOSIS — T8189XA Other complications of procedures, not elsewhere classified, initial encounter: Secondary | ICD-10-CM | POA: Diagnosis not present

## 2019-01-17 DIAGNOSIS — I10 Essential (primary) hypertension: Secondary | ICD-10-CM | POA: Diagnosis not present

## 2019-01-17 DIAGNOSIS — N401 Enlarged prostate with lower urinary tract symptoms: Secondary | ICD-10-CM | POA: Diagnosis present

## 2019-01-17 DIAGNOSIS — M1990 Unspecified osteoarthritis, unspecified site: Secondary | ICD-10-CM | POA: Diagnosis not present

## 2019-01-17 DIAGNOSIS — Z66 Do not resuscitate: Secondary | ICD-10-CM | POA: Diagnosis not present

## 2019-01-17 DIAGNOSIS — Z823 Family history of stroke: Secondary | ICD-10-CM

## 2019-01-17 DIAGNOSIS — D631 Anemia in chronic kidney disease: Secondary | ICD-10-CM | POA: Diagnosis present

## 2019-01-17 DIAGNOSIS — T8744 Infection of amputation stump, left lower extremity: Secondary | ICD-10-CM | POA: Diagnosis not present

## 2019-01-17 DIAGNOSIS — E1159 Type 2 diabetes mellitus with other circulatory complications: Secondary | ICD-10-CM | POA: Diagnosis present

## 2019-01-17 DIAGNOSIS — Z7901 Long term (current) use of anticoagulants: Secondary | ICD-10-CM

## 2019-01-17 DIAGNOSIS — Z978 Presence of other specified devices: Secondary | ICD-10-CM

## 2019-01-17 DIAGNOSIS — G4730 Sleep apnea, unspecified: Secondary | ICD-10-CM | POA: Diagnosis present

## 2019-01-17 DIAGNOSIS — E039 Hypothyroidism, unspecified: Secondary | ICD-10-CM | POA: Diagnosis present

## 2019-01-17 DIAGNOSIS — Z7982 Long term (current) use of aspirin: Secondary | ICD-10-CM

## 2019-01-17 DIAGNOSIS — Z20822 Contact with and (suspected) exposure to covid-19: Secondary | ICD-10-CM | POA: Diagnosis not present

## 2019-01-17 DIAGNOSIS — L03116 Cellulitis of left lower limb: Secondary | ICD-10-CM | POA: Diagnosis not present

## 2019-01-17 DIAGNOSIS — E860 Dehydration: Secondary | ICD-10-CM | POA: Diagnosis not present

## 2019-01-17 DIAGNOSIS — D649 Anemia, unspecified: Secondary | ICD-10-CM | POA: Diagnosis present

## 2019-01-17 LAB — CBC WITH DIFFERENTIAL/PLATELET
Abs Immature Granulocytes: 0.04 10*3/uL (ref 0.00–0.07)
Basophils Absolute: 0.1 10*3/uL (ref 0.0–0.1)
Basophils Relative: 1 %
Eosinophils Absolute: 0.7 10*3/uL — ABNORMAL HIGH (ref 0.0–0.5)
Eosinophils Relative: 6 %
HCT: 31.1 % — ABNORMAL LOW (ref 39.0–52.0)
Hemoglobin: 9.7 g/dL — ABNORMAL LOW (ref 13.0–17.0)
Immature Granulocytes: 0 %
Lymphocytes Relative: 27 %
Lymphs Abs: 3.2 10*3/uL (ref 0.7–4.0)
MCH: 31.3 pg (ref 26.0–34.0)
MCHC: 31.2 g/dL (ref 30.0–36.0)
MCV: 100.3 fL — ABNORMAL HIGH (ref 80.0–100.0)
Monocytes Absolute: 1.1 10*3/uL — ABNORMAL HIGH (ref 0.1–1.0)
Monocytes Relative: 9 %
Neutro Abs: 6.9 10*3/uL (ref 1.7–7.7)
Neutrophils Relative %: 57 %
Platelets: 253 10*3/uL (ref 150–400)
RBC: 3.1 MIL/uL — ABNORMAL LOW (ref 4.22–5.81)
RDW: 18.1 % — ABNORMAL HIGH (ref 11.5–15.5)
WBC: 12 10*3/uL — ABNORMAL HIGH (ref 4.0–10.5)
nRBC: 0 % (ref 0.0–0.2)

## 2019-01-17 LAB — COMPREHENSIVE METABOLIC PANEL
ALT: 17 U/L (ref 0–44)
AST: 21 U/L (ref 15–41)
Albumin: 3 g/dL — ABNORMAL LOW (ref 3.5–5.0)
Alkaline Phosphatase: 64 U/L (ref 38–126)
Anion gap: 9 (ref 5–15)
BUN: 25 mg/dL — ABNORMAL HIGH (ref 8–23)
CO2: 26 mmol/L (ref 22–32)
Calcium: 8.5 mg/dL — ABNORMAL LOW (ref 8.9–10.3)
Chloride: 100 mmol/L (ref 98–111)
Creatinine, Ser: 1.34 mg/dL — ABNORMAL HIGH (ref 0.61–1.24)
GFR calc Af Amer: 54 mL/min — ABNORMAL LOW (ref >60)
GFR calc non Af Amer: 47 mL/min — ABNORMAL LOW (ref >60)
Glucose, Bld: 165 mg/dL — ABNORMAL HIGH (ref 70–99)
Potassium: 3.8 mmol/L (ref 3.5–5.1)
Sodium: 135 mmol/L (ref 135–145)
Total Bilirubin: 0.6 mg/dL (ref 0.3–1.2)
Total Protein: 6.4 g/dL — ABNORMAL LOW (ref 6.5–8.1)

## 2019-01-17 LAB — LACTIC ACID, PLASMA: Lactic Acid, Venous: 1.1 mmol/L (ref 0.5–1.9)

## 2019-01-17 MED ORDER — SODIUM CHLORIDE 0.9% FLUSH: 3.0000 mL | Freq: Once | INTRAVENOUS | Status: DC

## 2019-01-17 NOTE — ED Triage Notes (Signed)
Pt had a Left BKA in the first of December.  Pt is now having pain to incision area and daughter states it is bleeding more and soaking the gauze.  Pt is on Eliquis daily.  Pt states to touch pain it is 10/10

## 2019-01-18 ENCOUNTER — Encounter (HOSPITAL_COMMUNITY): Payer: Self-pay | Admitting: Family Medicine

## 2019-01-18 ENCOUNTER — Other Ambulatory Visit: Payer: Self-pay | Admitting: Physician Assistant

## 2019-01-18 DIAGNOSIS — E039 Hypothyroidism, unspecified: Secondary | ICD-10-CM | POA: Diagnosis not present

## 2019-01-18 DIAGNOSIS — T8744 Infection of amputation stump, left lower extremity: Secondary | ICD-10-CM | POA: Diagnosis not present

## 2019-01-18 DIAGNOSIS — I480 Paroxysmal atrial fibrillation: Secondary | ICD-10-CM

## 2019-01-18 DIAGNOSIS — D649 Anemia, unspecified: Secondary | ICD-10-CM | POA: Diagnosis present

## 2019-01-18 DIAGNOSIS — M1990 Unspecified osteoarthritis, unspecified site: Secondary | ICD-10-CM | POA: Diagnosis present

## 2019-01-18 DIAGNOSIS — Z7902 Long term (current) use of antithrombotics/antiplatelets: Secondary | ICD-10-CM | POA: Diagnosis not present

## 2019-01-18 DIAGNOSIS — L03116 Cellulitis of left lower limb: Secondary | ICD-10-CM | POA: Diagnosis not present

## 2019-01-18 DIAGNOSIS — T8189XA Other complications of procedures, not elsewhere classified, initial encounter: Secondary | ICD-10-CM | POA: Diagnosis present

## 2019-01-18 DIAGNOSIS — M79672 Pain in left foot: Secondary | ICD-10-CM | POA: Diagnosis not present

## 2019-01-18 DIAGNOSIS — Z809 Family history of malignant neoplasm, unspecified: Secondary | ICD-10-CM | POA: Diagnosis not present

## 2019-01-18 DIAGNOSIS — I130 Hypertensive heart and chronic kidney disease with heart failure and stage 1 through stage 4 chronic kidney disease, or unspecified chronic kidney disease: Secondary | ICD-10-CM | POA: Diagnosis not present

## 2019-01-18 DIAGNOSIS — T8142XA Infection following a procedure, deep incisional surgical site, initial encounter: Secondary | ICD-10-CM | POA: Diagnosis not present

## 2019-01-18 DIAGNOSIS — I10 Essential (primary) hypertension: Secondary | ICD-10-CM | POA: Diagnosis not present

## 2019-01-18 DIAGNOSIS — E785 Hyperlipidemia, unspecified: Secondary | ICD-10-CM | POA: Diagnosis present

## 2019-01-18 DIAGNOSIS — E1159 Type 2 diabetes mellitus with other circulatory complications: Secondary | ICD-10-CM

## 2019-01-18 DIAGNOSIS — Z8249 Family history of ischemic heart disease and other diseases of the circulatory system: Secondary | ICD-10-CM | POA: Diagnosis not present

## 2019-01-18 DIAGNOSIS — E1152 Type 2 diabetes mellitus with diabetic peripheral angiopathy with gangrene: Secondary | ICD-10-CM | POA: Diagnosis not present

## 2019-01-18 DIAGNOSIS — T8754 Necrosis of amputation stump, left lower extremity: Secondary | ICD-10-CM | POA: Diagnosis not present

## 2019-01-18 DIAGNOSIS — L0390 Cellulitis, unspecified: Secondary | ICD-10-CM | POA: Diagnosis present

## 2019-01-18 DIAGNOSIS — T8781 Dehiscence of amputation stump: Secondary | ICD-10-CM | POA: Diagnosis not present

## 2019-01-18 DIAGNOSIS — T8149XA Infection following a procedure, other surgical site, initial encounter: Secondary | ICD-10-CM | POA: Diagnosis not present

## 2019-01-18 DIAGNOSIS — Z20822 Contact with and (suspected) exposure to covid-19: Secondary | ICD-10-CM | POA: Diagnosis not present

## 2019-01-18 DIAGNOSIS — H409 Unspecified glaucoma: Secondary | ICD-10-CM | POA: Diagnosis present

## 2019-01-18 DIAGNOSIS — D62 Acute posthemorrhagic anemia: Secondary | ICD-10-CM | POA: Diagnosis not present

## 2019-01-18 DIAGNOSIS — N1831 Chronic kidney disease, stage 3a: Secondary | ICD-10-CM | POA: Diagnosis not present

## 2019-01-18 DIAGNOSIS — I5032 Chronic diastolic (congestive) heart failure: Secondary | ICD-10-CM | POA: Diagnosis not present

## 2019-01-18 DIAGNOSIS — I96 Gangrene, not elsewhere classified: Secondary | ICD-10-CM | POA: Diagnosis present

## 2019-01-18 DIAGNOSIS — Z7901 Long term (current) use of anticoagulants: Secondary | ICD-10-CM | POA: Diagnosis not present

## 2019-01-18 DIAGNOSIS — G4730 Sleep apnea, unspecified: Secondary | ICD-10-CM | POA: Diagnosis present

## 2019-01-18 DIAGNOSIS — Z66 Do not resuscitate: Secondary | ICD-10-CM | POA: Diagnosis present

## 2019-01-18 DIAGNOSIS — Z91041 Radiographic dye allergy status: Secondary | ICD-10-CM | POA: Diagnosis not present

## 2019-01-18 DIAGNOSIS — I5033 Acute on chronic diastolic (congestive) heart failure: Secondary | ICD-10-CM | POA: Diagnosis not present

## 2019-01-18 DIAGNOSIS — Z823 Family history of stroke: Secondary | ICD-10-CM | POA: Diagnosis not present

## 2019-01-18 DIAGNOSIS — Z87891 Personal history of nicotine dependence: Secondary | ICD-10-CM | POA: Diagnosis not present

## 2019-01-18 DIAGNOSIS — Z89511 Acquired absence of right leg below knee: Secondary | ICD-10-CM | POA: Diagnosis not present

## 2019-01-18 DIAGNOSIS — Z7984 Long term (current) use of oral hypoglycemic drugs: Secondary | ICD-10-CM | POA: Diagnosis not present

## 2019-01-18 LAB — C-REACTIVE PROTEIN: CRP: 6.2 mg/dL — ABNORMAL HIGH (ref <1.0)

## 2019-01-18 LAB — CBC
HCT: 30.8 % — ABNORMAL LOW (ref 39.0–52.0)
Hemoglobin: 9.9 g/dL — ABNORMAL LOW (ref 13.0–17.0)
MCH: 30.9 pg (ref 26.0–34.0)
MCHC: 32.1 g/dL (ref 30.0–36.0)
MCV: 96.3 fL (ref 80.0–100.0)
Platelets: 263 K/uL (ref 150–400)
RBC: 3.2 MIL/uL — ABNORMAL LOW (ref 4.22–5.81)
RDW: 17.8 % — ABNORMAL HIGH (ref 11.5–15.5)
WBC: 12.1 K/uL — ABNORMAL HIGH (ref 4.0–10.5)
nRBC: 0 % (ref 0.0–0.2)

## 2019-01-18 LAB — BASIC METABOLIC PANEL
Anion gap: 9 (ref 5–15)
BUN: 25 mg/dL — ABNORMAL HIGH (ref 8–23)
CO2: 27 mmol/L (ref 22–32)
Calcium: 8.6 mg/dL — ABNORMAL LOW (ref 8.9–10.3)
Chloride: 95 mmol/L — ABNORMAL LOW (ref 98–111)
Creatinine, Ser: 1.29 mg/dL — ABNORMAL HIGH (ref 0.61–1.24)
GFR calc Af Amer: 57 mL/min — ABNORMAL LOW (ref >60)
GFR calc non Af Amer: 49 mL/min — ABNORMAL LOW (ref >60)
Glucose, Bld: 133 mg/dL — ABNORMAL HIGH (ref 70–99)
Potassium: 3.7 mmol/L (ref 3.5–5.1)
Sodium: 131 mmol/L — ABNORMAL LOW (ref 135–145)

## 2019-01-18 LAB — APTT: aPTT: 156 seconds — ABNORMAL HIGH (ref 24–36)

## 2019-01-18 LAB — SEDIMENTATION RATE: Sed Rate: 45 mm/hr — ABNORMAL HIGH (ref 0–16)

## 2019-01-18 LAB — SARS CORONAVIRUS 2 (TAT 6-24 HRS): SARS Coronavirus 2: NEGATIVE

## 2019-01-18 LAB — GLUCOSE, CAPILLARY
Glucose-Capillary: 92 mg/dL (ref 70–99)
Glucose-Capillary: 148 mg/dL — ABNORMAL HIGH (ref 70–99)

## 2019-01-18 LAB — PREALBUMIN: Prealbumin: 11.6 mg/dL — ABNORMAL LOW (ref 18–38)

## 2019-01-18 MED ORDER — TRAMADOL HCL 50 MG PO TABS
50.0000 mg | ORAL_TABLET | Freq: Four times a day (QID) | ORAL | Status: DC | PRN
  Administered 2019-01-18 – 2019-01-20 (×5): 50 mg via ORAL
  Filled 2019-01-18 (×5): qty 1

## 2019-01-18 MED ORDER — DORZOLAMIDE HCL-TIMOLOL MAL 2-0.5 % OP SOLN
1.0000 [drp] | Freq: Two times a day (BID) | OPHTHALMIC | Status: DC
  Administered 2019-01-18 – 2019-01-22 (×8): 1 [drp] via OPHTHALMIC
  Filled 2019-01-18 (×3): qty 10

## 2019-01-18 MED ORDER — HEPARIN BOLUS VIA INFUSION
3000.0000 [IU] | Freq: Once | INTRAVENOUS | Status: AC
  Administered 2019-01-18: 3000 [IU] via INTRAVENOUS
  Filled 2019-01-18: qty 3000

## 2019-01-18 MED ORDER — TAMSULOSIN HCL 0.4 MG PO CAPS
0.4000 mg | ORAL_CAPSULE | Freq: Two times a day (BID) | ORAL | Status: DC
  Administered 2019-01-18 – 2019-01-22 (×8): 0.4 mg via ORAL
  Filled 2019-01-18 (×8): qty 1

## 2019-01-18 MED ORDER — CHLORHEXIDINE GLUCONATE CLOTH 2 % EX PADS
6.0000 | MEDICATED_PAD | Freq: Every day | CUTANEOUS | Status: DC
  Administered 2019-01-19 – 2019-01-22 (×4): 6 via TOPICAL

## 2019-01-18 MED ORDER — HEPARIN (PORCINE) 25000 UT/250ML-% IV SOLN
1200.0000 [IU]/h | INTRAVENOUS | Status: DC
  Administered 2019-01-18: 1200 [IU]/h via INTRAVENOUS
  Filled 2019-01-18: qty 250

## 2019-01-18 MED ORDER — FINASTERIDE 5 MG PO TABS
5.0000 mg | ORAL_TABLET | Freq: Every evening | ORAL | Status: DC
  Administered 2019-01-18 – 2019-01-21 (×4): 5 mg via ORAL
  Filled 2019-01-18 (×5): qty 1

## 2019-01-18 MED ORDER — SODIUM CHLORIDE 0.9 % IV SOLN: 250.0000 mL | INTRAVENOUS | Status: DC | PRN

## 2019-01-18 MED ORDER — SODIUM CHLORIDE 0.9% FLUSH
3.0000 mL | Freq: Two times a day (BID) | INTRAVENOUS | Status: DC
  Administered 2019-01-20 – 2019-01-22 (×3): 3 mL via INTRAVENOUS

## 2019-01-18 MED ORDER — ATORVASTATIN CALCIUM 40 MG PO TABS
40.0000 mg | ORAL_TABLET | Freq: Every day | ORAL | Status: DC
  Administered 2019-01-18 – 2019-01-22 (×5): 40 mg via ORAL
  Filled 2019-01-18 (×5): qty 1

## 2019-01-18 MED ORDER — DOCUSATE SODIUM 100 MG PO CAPS
100.0000 mg | ORAL_CAPSULE | Freq: Every evening | ORAL | Status: DC
  Administered 2019-01-18 – 2019-01-21 (×4): 100 mg via ORAL
  Filled 2019-01-18 (×4): qty 1

## 2019-01-18 MED ORDER — INSULIN ASPART 100 UNIT/ML ~~LOC~~ SOLN
0.0000 [IU] | Freq: Three times a day (TID) | SUBCUTANEOUS | Status: DC
  Administered 2019-01-19 (×3): 1 [IU] via SUBCUTANEOUS
  Administered 2019-01-20 – 2019-01-21 (×2): 2 [IU] via SUBCUTANEOUS
  Administered 2019-01-21 – 2019-01-22 (×2): 1 [IU] via SUBCUTANEOUS
  Administered 2019-01-22: 5 [IU] via SUBCUTANEOUS

## 2019-01-18 MED ORDER — ONDANSETRON HCL 4 MG PO TABS: 4.0000 mg | ORAL_TABLET | Freq: Four times a day (QID) | ORAL | Status: DC | PRN

## 2019-01-18 MED ORDER — FERROUS SULFATE 325 (65 FE) MG PO TABS
325.0000 mg | ORAL_TABLET | Freq: Two times a day (BID) | ORAL | Status: DC
  Administered 2019-01-18 – 2019-01-22 (×8): 325 mg via ORAL
  Filled 2019-01-18 (×8): qty 1

## 2019-01-18 MED ORDER — SENNOSIDES-DOCUSATE SODIUM 8.6-50 MG PO TABS
2.0000 | ORAL_TABLET | Freq: Every day | ORAL | Status: DC
  Administered 2019-01-18 – 2019-01-21 (×4): 2 via ORAL
  Filled 2019-01-18 (×5): qty 2

## 2019-01-18 MED ORDER — ACETAMINOPHEN 325 MG PO TABS
650.0000 mg | ORAL_TABLET | Freq: Four times a day (QID) | ORAL | Status: DC | PRN
  Administered 2019-01-18 – 2019-01-21 (×4): 650 mg via ORAL
  Filled 2019-01-18 (×4): qty 2

## 2019-01-18 MED ORDER — VANCOMYCIN HCL 750 MG/150ML IV SOLN
750.0000 mg | INTRAVENOUS | Status: DC
  Administered 2019-01-18 – 2019-01-21 (×4): 750 mg via INTRAVENOUS
  Filled 2019-01-18 (×5): qty 150

## 2019-01-18 MED ORDER — LEVOTHYROXINE SODIUM 50 MCG PO TABS
50.0000 ug | ORAL_TABLET | Freq: Every day | ORAL | Status: DC
  Administered 2019-01-18: 50 ug via ORAL
  Filled 2019-01-18: qty 1

## 2019-01-18 MED ORDER — FUROSEMIDE 40 MG PO TABS
40.0000 mg | ORAL_TABLET | Freq: Every day | ORAL | Status: DC
  Administered 2019-01-18 – 2019-01-19 (×2): 40 mg via ORAL
  Filled 2019-01-18 (×2): qty 1

## 2019-01-18 MED ORDER — ACETAMINOPHEN 650 MG RE SUPP: 650.0000 mg | Freq: Four times a day (QID) | RECTAL | Status: DC | PRN

## 2019-01-18 MED ORDER — POTASSIUM CHLORIDE CRYS ER 10 MEQ PO TBCR
10.0000 meq | EXTENDED_RELEASE_TABLET | Freq: Every day | ORAL | Status: DC
  Administered 2019-01-18 – 2019-01-22 (×4): 10 meq via ORAL
  Filled 2019-01-18 (×4): qty 1

## 2019-01-18 MED ORDER — ONDANSETRON HCL 4 MG/2ML IJ SOLN: 4.0000 mg | Freq: Four times a day (QID) | INTRAMUSCULAR | Status: DC | PRN

## 2019-01-18 MED ORDER — FAMOTIDINE 20 MG PO TABS
20.0000 mg | ORAL_TABLET | Freq: Every day | ORAL | Status: DC
  Administered 2019-01-18 – 2019-01-22 (×4): 20 mg via ORAL
  Filled 2019-01-18 (×5): qty 1

## 2019-01-18 MED ORDER — VITAMIN E 180 MG (400 UNIT) PO CAPS
400.0000 [IU] | ORAL_CAPSULE | Freq: Every day | ORAL | Status: DC
  Administered 2019-01-19 – 2019-01-22 (×3): 400 [IU] via ORAL
  Filled 2019-01-18 (×5): qty 1

## 2019-01-18 MED ORDER — SODIUM CHLORIDE 0.9% FLUSH: 3.0000 mL | INTRAVENOUS | Status: DC | PRN

## 2019-01-18 MED ORDER — INSULIN ASPART 100 UNIT/ML ~~LOC~~ SOLN: 0.0000 [IU] | SUBCUTANEOUS | Status: DC

## 2019-01-18 MED ORDER — VITAMIN B-6 100 MG PO TABS
100.0000 mg | ORAL_TABLET | Freq: Every day | ORAL | Status: DC
  Administered 2019-01-19 – 2019-01-22 (×3): 100 mg via ORAL
  Filled 2019-01-18 (×4): qty 1

## 2019-01-18 MED ORDER — POLYETHYLENE GLYCOL 3350 17 G PO PACK: 17.0000 g | PACK | Freq: Every day | ORAL | Status: DC | PRN

## 2019-01-18 MED ORDER — SODIUM CHLORIDE 0.9 % IV SOLN
2.0000 g | INTRAVENOUS | Status: DC
  Administered 2019-01-18 – 2019-01-21 (×4): 2 g via INTRAVENOUS
  Filled 2019-01-18 (×4): qty 20

## 2019-01-18 MED ORDER — LEVOTHYROXINE SODIUM 50 MCG PO TABS
50.0000 ug | ORAL_TABLET | Freq: Every day | ORAL | Status: DC
  Administered 2019-01-19 – 2019-01-22 (×3): 50 ug via ORAL
  Filled 2019-01-18 (×3): qty 1

## 2019-01-18 MED ORDER — METRONIDAZOLE IN NACL 5-0.79 MG/ML-% IV SOLN
500.0000 mg | Freq: Three times a day (TID) | INTRAVENOUS | Status: DC
  Administered 2019-01-18 – 2019-01-21 (×10): 500 mg via INTRAVENOUS
  Filled 2019-01-18 (×10): qty 100

## 2019-01-18 MED ORDER — FENTANYL CITRATE (PF) 100 MCG/2ML IJ SOLN
25.0000 ug | INTRAMUSCULAR | Status: DC | PRN
  Administered 2019-01-20 (×2): 25 ug via INTRAVENOUS
  Filled 2019-01-18 (×2): qty 2

## 2019-01-18 MED ORDER — ASCORBIC ACID 500 MG PO TABS
500.0000 mg | ORAL_TABLET | Freq: Every day | ORAL | Status: DC
  Administered 2019-01-19 – 2019-01-22 (×3): 500 mg via ORAL
  Filled 2019-01-18 (×4): qty 1

## 2019-01-18 NOTE — Progress Notes (Addendum)
ANTICOAGULATION CONSULT NOTE - Initial Consult  Pharmacy Consult for Heparin Indication: atrial fibrillation  Allergies  Allergen Reactions  . Iodinated Diagnostic Agents     Other reaction(s): Fever  . Dye Fdc Red [Red Dye] Hives    Tolerated red Docusate, Niferex  . Iodine-131 Other (See Comments)    Breakout, fever  . Iohexol      Code: RASH, Desc: PT STATES HOT ALL OVER HAD TO HAVE ICE PACKS ALL OVER HIM AND DIFF BREATHING, PT REFUSED IV DYE     Patient Measurements:   Heparin Dosing Weight:85.3  Vital Signs: Temp: 97.7 F (36.5 C) (01/04 1327) Temp Source: Oral (01/04 1327) BP: 128/52 (01/04 1327) Pulse Rate: 57 (01/04 1327)  Labs: Recent Labs    01/17/19 1632 01/18/19 0623  HGB 9.7* 9.9*  HCT 31.1* 30.8*  PLT 253 263  CREATININE 1.34* 1.29*    Estimated Creatinine Clearance: 41.3 mL/min (A) (by C-G formula based on SCr of 1.29 mg/dL (H)).   Medical History: Past Medical History:  Diagnosis Date  . Arthritis   . Borderline diabetic   . Diabetes mellitus without complication ([place])   . Glaucoma   . Hyperlipidemia   . Hypertension   . PVD (peripheral vascular disease) ([place])   . Sleep apnea    Cpap ordered but doesnt use     Assessment: 86 y.o. male presented with possible wound infection/cellulitis with history of Afib. Patient was on apixaban 5 mg BID prior to admission, last dose was taken on the morning of 01/17/2019. Hold apixaban, pending surgeon's assessment. Pharmacy consulted for heparin.  Due to recent apixaban dose, heparin level will be falsely elevated. Monitor aPTT and heparin until both correlate.   Goal of Therapy:  Heparin level goal 0.3 to 0.7 APTT 66 to 102 seconds  Monitor platelets by anticoagulation protocol: Yes   Plan:  Load Heparin 3000 units IV; then start Heparin gtt at 1200 units/hr Obtain baseline aPTT and then 8hr aPTT level  Monitor daily heparin level, CBC, and sign/symptoms of bleeding  Acey Lav, PharmD  PGY1  [place] Resident [phone] 01/18/2019,2:37 PM

## 2019-01-18 NOTE — ED Notes (Signed)
Report called to Marissa, RN

## 2019-01-18 NOTE — ED Provider Notes (Signed)
[facility] EMERGENCY DEPARTMENT Provider Note   CSN: [id number] Arrival date & time: 01/17/19  1440     History Chief Complaint  Patient presents with  . Leg Pain    LBKA bleeding and on ELIQUIS    [name] is a 86 y.o. male.  The history is provided by the patient and a relative.  Patient with history of diabetes, peripheral vascular disease, hypertension, hyperlipidemia presents with postoperative problem. Patient underwent a left BKA on December 4.  Patient has done well without any complications.  Over the past 2 days patient has had increasing pain, redness, drainage and bleeding from the wound.  Patient is on Eliquis for anticoagulation and daughter feels this is worsening bleeding.  No fevers or chills. No other acute complaints. His course is worsening, nothing improves his symptoms     Past Medical History:  Diagnosis Date  . Arthritis   . Borderline diabetic   . Diabetes mellitus without complication ([place])   . Glaucoma   . Hyperlipidemia   . Hypertension   . PVD (peripheral vascular disease) ([place])   . Sleep apnea    Cpap ordered but doesnt use    Patient Active Problem List   Diagnosis Date Noted  . Abnormal urinalysis   . Labile blood glucose   . Urinary retention   . S/P bilateral BKA (below knee amputation) ([place])   . Acute blood loss anemia   . Urinary retention due to benign prostatic hyperplasia 12/28/2018  . Unilateral complete BKA, left, initial encounter ([place]) 12/25/2018  . Dehiscence of amputation stump ([place])   . History of transmetatarsal amputation of left foot ([place]) 12/09/2018  . Critical limb ischemia with history of revascularization of same extremity   . Gangrene of left foot ([place])   . Diabetic ulcer of toe of left foot associated with type 2 diabetes mellitus ([place])   . Cellulitis of left lower extremity   . Toe infection 11/18/2018  . Peripheral edema   . Acute on chronic diastolic CHF (congestive heart failure)  ([place]) 11/09/2018  . S/P BKA (below knee amputation) unilateral, right ([place]) 10/05/2018  . Hyponatremia   . Essential hypertension   . Postoperative pain   . Pain of left heel   . Unable to maintain weight-bearing   . Type 2 diabetes mellitus with diabetic peripheral angiopathy with gangrene ([place])   . Anemia due to acute blood loss   . Stage 3 chronic kidney disease   . Acquired absence of right leg below knee ([place]) 09/07/2018  . Gangrene of right foot ([place])   . DNR (do not resuscitate) discussion   . Goals of care, counseling/discussion   . Palliative care by specialist   . Type 2 diabetes mellitus with vascular disease ([place])   . Severe protein-calorie malnutrition ([place])   . Ischemic foot 08/20/2018  . Critical lower limb ischemia 08/03/2018  . Type 2 diabetes mellitus with foot ulcer, without long-term current use of insulin ([place])   . Gastroesophageal reflux disease   . Cellulitis of great toe of right foot 05/29/2018  . Atrial fibrillation, chronic   . Chest pain 07/25/2017  . PAF (paroxysmal atrial fibrillation) ([place]) 07/25/2017  . BPH (benign prostatic hyperplasia) 07/25/2017  . Shortness of breath 05/11/2017  . Hypothyroidism 05/11/2017  . Chronic diastolic CHF (congestive heart failure) ([place]) 05/11/2017  . Moderate aortic stenosis 06/12/2016  . RBBB 06/12/2016  . Peripheral vascular disease ([place]) 08/04/2012  . Essential hypertension, benign 08/04/2012  .  Hyperlipidemia 08/04/2012  . Dizziness 08/04/2012    Past Surgical History:  Procedure Laterality Date  . ABDOMINAL AORTOGRAM W/LOWER EXTREMITY N/A 07/27/2018   Procedure: ABDOMINAL AORTOGRAM W/LOWER EXTREMITY;  Surgeon: Lorretta Harp, MD;  Location: [facility];  Service: Cardiovascular;  Laterality: N/A;  . AMPUTATION Right 08/28/2018   Procedure: RIGHT BELOW KNEE AMPUTATION;  Surgeon: [name], MD;  Location: [facility];  Service: Orthopedics;  Laterality: Right;  . AMPUTATION Left 12/02/2018    Procedure: LEFT TRANSMETATARSAL AMPUTATION AND NEGATIVE PRESSURE WOUND VAC PLACEMENT;  Surgeon: [name], MD;  Location: [facility];  Service: Orthopedics;  Laterality: Left;  . AMPUTATION Left 12/18/2018   Procedure: LEFT BELOW KNEE AMPUTATION;  Surgeon: [name], MD;  Location: [facility];  Service: Orthopedics;  Laterality: Left;  . APPENDECTOMY  1943  . BELOW KNEE LEG AMPUTATION Left 12/18/2018  . CATARACT EXTRACTION  2012   x2  . COLONOSCOPY N/A 11/01/2014   Procedure: COLONOSCOPY;  Surgeon: Aviva Signs, MD;  [facility];  Service: Gastroenterology;  Laterality: N/A;  . ESOPHAGOGASTRODUODENOSCOPY N/A 11/01/2014   Procedure: ESOPHAGOGASTRODUODENOSCOPY (EGD);  Surgeon: Aviva Signs, MD;  [facility];  Service: Gastroenterology;  Laterality: N/A;  . HERNIA REPAIR Left   . INGUINAL HERNIA REPAIR Right 03/01/2013   Procedure: RIGHT INGUINAL HERNIORRHAPHY;  Surgeon: Jamesetta So, MD;  Location: [facility];  Service: General;  Laterality: Right;  . INSERTION OF MESH Right 03/01/2013   Procedure: INSERTION OF MESH;  Surgeon: Jamesetta So, MD;  Location: [facility];  Service: General;  Laterality: Right;  . LOWER EXTREMITY ANGIOGRAPHY Right 08/25/2018   Procedure: LOWER EXTREMITY ANGIOGRAPHY;  Surgeon: Wellington Hampshire, MD;  Location: [facility];  Service: Cardiovascular;  Laterality: Right;  . LOWER EXTREMITY ANGIOGRAPHY N/A 11/25/2018   Procedure: LOWER EXTREMITY ANGIOGRAPHY;  Surgeon: Wellington Hampshire, MD;  Location: [facility];  Service: Cardiovascular;  Laterality: N/A;  . NM MYOCAR PERF WALL MOTION  06/05/2010   no significant ischemia  . PERIPHERAL VASCULAR ATHERECTOMY Right 08/03/2018   Procedure: PERIPHERAL VASCULAR ATHERECTOMY;  Surgeon: Lorretta Harp, MD;  Location: [facility];  Service: Cardiovascular;  Laterality: Right;  . PERIPHERAL VASCULAR ATHERECTOMY Left 11/23/2018   Procedure: PERIPHERAL VASCULAR ATHERECTOMY;  Surgeon: Lorretta Harp, MD;  Location: [facility];  Service: Cardiovascular;  Laterality: Left;  sfa with dc balloon  . PERIPHERAL VASCULAR BALLOON ANGIOPLASTY Left 11/25/2018   Procedure: PERIPHERAL VASCULAR BALLOON ANGIOPLASTY;  Surgeon: Wellington Hampshire, MD;  Location: [facility];  Service: Cardiovascular;  Laterality: Left;  popliteal  . PERIPHERAL VASCULAR INTERVENTION Left 11/25/2018   Procedure: PERIPHERAL VASCULAR INTERVENTION;  Surgeon: Wellington Hampshire, MD;  Location: [facility];  Service: Cardiovascular;  Laterality: Left;  tibial peroneal trunk and peroneal stents   . stent  06/14/2010   left leg  . US ECHOCARDIOGRAPHY  01/21/2006   moderate mitral annular ca+, mild MR, AOV moderately sclerotic.       Family History  Problem Relation Age of Onset  . Cancer Mother   . Heart attack Father   . Stroke Father   . Heart attack Brother   . Heart attack Brother     Social History   Tobacco Use  . Smoking status: Former Smoker    Quit date: 01/13/1965    Years since quitting: 54.0  . Smokeless tobacco: Former Systems developer    Types:  Chew  Substance Use Topics  . Alcohol use: No  . Drug use: No    Home Medications Prior to Admission medications   Medication Sig Start Date End Date Taking? Authorizing Provider  acetaminophen (TYLENOL) 500 MG tablet Take 1 tablet (500 mg total) by mouth 3 (three) times daily. 01/05/19   Love, Ivan Anchors, PA-C  apixaban (ELIQUIS) 5 MG TABS tablet Take 1 tablet (5 mg total) by mouth 2 (two) times daily. 12/09/18   Mercy Riding, MD  aspirin EC 81 MG tablet Take 81 mg by mouth daily.    [provider]  atorvastatin (LIPITOR) 40 MG tablet Take 1 tablet (40 mg total) by mouth daily. 09/22/18   Angiulli, Lavon Paganini, PA-C  clopidogrel (PLAVIX) 75 MG tablet Take 1 tablet (75 mg total) by mouth daily with breakfast. 12/10/18   Mercy Riding, MD  diclofenac Sodium (VOLTAREN) 1 % GEL Apply 2 g topically 4 (four) times daily. 01/05/19   Love,  Ivan Anchors, PA-C  docusate sodium (COLACE) 100 MG capsule Take 1 capsule (100 mg total) by mouth 2 (two) times daily. 01/05/19   Love, Ivan Anchors, PA-C  dorzolamide-timolol (COSOPT) 22.3-6.8 MG/ML ophthalmic solution Place 1 drop into both eyes 2 (two) times daily.    [provider]  famotidine (PEPCID) 20 MG tablet Take 1 tablet (20 mg total) by mouth daily. 11/12/18   Johnson, Clanford L, MD  finasteride (PROSCAR) 5 MG tablet Take 1 tablet (5 mg total) by mouth every evening. 09/22/18   Angiulli, Lavon Paganini, PA-C  furosemide (LASIX) 40 MG tablet Take 1 tablet (40 mg total) by mouth daily. 12/09/18   Mercy Riding, MD  glipiZIDE (GLUCOTROL) 5 MG tablet Take 5 mg by mouth 2 (two) times daily.     [provider]  iron polysaccharides (NIFEREX) 150 MG capsule Take 1 capsule (150 mg total) by mouth daily. 12/10/18   Mercy Riding, MD  levothyroxine (SYNTHROID) 50 MCG tablet Take 1 tablet (50 mcg total) by mouth daily before breakfast. 09/22/18   Angiulli, Lavon Paganini, PA-C  Omega-3 Fatty Acids (FISH OIL PO) Take 1 capsule by mouth daily.    [provider]  polyethylene glycol (MIRALAX) 17 g packet Take 17 g by mouth daily as needed for moderate constipation. 12/09/18   Mercy Riding, MD  potassium chloride (KLOR-CON) 10 MEQ tablet Take 1 tablet (10 mEq total) by mouth daily. 11/12/18   Johnson, Clanford L, MD  senna-docusate (SENOKOT-S) 8.6-50 MG tablet Take 2 tablets by mouth at bedtime. 01/05/19   Love, Ivan Anchors, PA-C  tamsulosin (FLOMAX) 0.4 MG CAPS capsule Take 1 capsule (0.4 mg total) by mouth 2 (two) times daily. 09/22/18   Angiulli, Lavon Paganini, PA-C  traMADol (ULTRAM) 50 MG tablet Take 1 tablet (50 mg total) by mouth every 6 (six) hours as needed for severe pain. 01/05/19   Love, Ivan Anchors, PA-C  vitamin B-12 (CYANOCOBALAMIN) 1000 MCG tablet Take 1 tablet (1,000 mcg total) by mouth daily. 12/09/18   Mercy Riding, MD  vitamin C (ASCORBIC ACID) 500 MG tablet Take 500 mg by mouth daily.     [provider]    Allergies    Iodinated diagnostic agents, Dye fdc red [red dye], Iodine-131, and Iohexol  Review of Systems   Review of Systems  Constitutional: Negative for fever.  Gastrointestinal: Negative for vomiting.  Skin: Positive for wound.  All other systems reviewed and are negative.   Physical Exam Updated  Vital Signs BP (!) 137/58 (BP Location: Left Arm)   Pulse 61   Temp 98.2 F (36.8 C) (Oral)   Resp 16   SpO2 100%   Physical Exam CONSTITUTIONAL: Elderly, no acute distress HEAD: Normocephalic/atraumatic EYES: EOMI ENMT: Mucous membranes moist NECK: supple no meningeal signs CV: P6/P9 noted, systolic ejection murmur noted LUNGS: Lungs are clear to auscultation bilaterally, no apparent distress ABDOMEN: soft, nontender NEURO: Pt is awake/alert/appropriate, moves all extremitiesx4.  No facial droop.   EXTREMITIES: Patient has evidence of bilateral BKA.  Stump on the right leg is well-healed. Left BKA stump is erythematous, tenderness to palpation.  No crepitus.  There is foul-smelling drainage, but no active bleeding.  See photo below SKIN: warm, color normal, see photo PSYCH: no abnormalities of mood noted, alert and oriented to situation     Patient gave verbal permission to utilize photo for medical documentation only The image was not stored on any personal device  ED Results / Procedures / Treatments   Labs (all labs ordered are listed, but only abnormal results are displayed) Labs Reviewed  COMPREHENSIVE METABOLIC PANEL - Abnormal; Notable for the following components:      Result Value   Glucose, Bld 165 (*)    BUN 25 (*)    Creatinine, Ser 1.34 (*)    Calcium 8.5 (*)    Total Protein 6.4 (*)    Albumin 3.0 (*)    GFR calc non Af Amer 47 (*)    GFR calc Af Amer 54 (*)    All other components within normal limits  CBC WITH DIFFERENTIAL/PLATELET - Abnormal; Notable for the following components:   WBC 12.0 (*)    RBC 3.10 (*)     Hemoglobin 9.7 (*)    HCT 31.1 (*)    MCV 100.3 (*)    RDW 18.1 (*)    Monocytes Absolute 1.1 (*)    Eosinophils Absolute 0.7 (*)    All other components within normal limits  SARS CORONAVIRUS 2 (TAT 6-24 HRS)  CULTURE, BLOOD (ROUTINE X 2)  CULTURE, BLOOD (ROUTINE X 2)  LACTIC ACID, PLASMA    EKG None  Radiology No results found.  Procedures Procedures   Medications Ordered in ED Medications  sodium chloride flush (NS) 0.9 % injection 3 mL (has no administration in time range)    ED Course  I have reviewed the triage vital signs and the nursing notes.  Pertinent labs results that were available during my care of the patient were reviewed by me and considered in my medical decision making (see chart for details).    MDM Rules/Calculators/A&P                     6:21 AM Patient presents with pain and swelling and drainage from a left BKA incision.  Surgery was on December 4.  Over the past 2 days patient has had increasing pain, bloody drainage and redness.  I have a high concern for postoperative infection.  Patient does have an elevated WBC count.  I discussed the case with Dr. Ninfa Linden with orthopedics.  He will relay the message to this patient's surgeon Dr. Sharol Given.  Plan for now will be to admit to the hospitalist service as patient will likely require IV antibiotics.  Blood cultures to be ordered, and will defer antibiotics until seen by orthopedics.  Patient is not currently septic appearing with a normal lactate Final Clinical Impression(s) / ED Diagnoses Final diagnoses:  Infection of  deep incisional surgical site after procedure, initial encounter    Rx / DC Orders ED Discharge Orders    None       Ripley Fraise, MD 01/18/19 [phone]

## 2019-01-18 NOTE — Consult Note (Addendum)
WOC Nurse Consult Note: Patient receiving care in [facility] ED 18.  Consult completed remotely after review of record, including images of surgical site. Reason for Consult: LE wound Wound type: Transtibial amputation site by Dr. Sharol Given. Dr. Sharol Given is managing this patient's care Pressure Injury POA: Yes/No/NA Measurement: Wound bed: Drainage (amount, consistency, odor)  Periwound: Diagnosed with cellulitis Dressing procedure/placement/frequency:  Place Aquacel Kellie Simmering [phone]) over the LE amputation site. Cover with ABD pads, secure with kerlex. Per Dr. Jess Barters note, the patient may undergo revision of the amputation in the near future. [facility] nurse will not follow at this time.  Please re-consult the [facility] team if needed.  Val Riles, RN, MSN, CWOCN, CNS-BC, pager [phone]

## 2019-01-18 NOTE — Progress Notes (Signed)
Pharmacy Antibiotic Note  [name] is a 86 y.o. male admitted on 01/17/2019 with cellulitis and / Diabetic foot infection.  Pharmacy has been consulted for Vancomycin dosing.     Temp (24hrs), Avg:98.4 F (36.9 C), Min:98.2 F (36.8 C), Max:98.5 F (36.9 C)  Recent Labs  Lab 01/17/19 1632  WBC 12.0*  CREATININE 1.34*  LATICACIDVEN 1.1    Estimated Creatinine Clearance: 39.8 mL/min (A) (by C-G formula based on SCr of 1.34 mg/dL (H)).    Allergies  Allergen Reactions  . Iodinated Diagnostic Agents     Other reaction(s): Fever  . Dye Fdc Red [Red Dye] Hives    Tolerated red Docusate, Niferex  . Iodine-131 Other (See Comments)    Breakout, fever  . Iohexol      Code: RASH, Desc: PT STATES HOT ALL OVER HAD TO HAVE ICE PACKS ALL OVER HIM AND DIFF BREATHING, PT REFUSED IV DYE     Antimicrobials this admission: 1/4  Ceftriaxone >>  1/4 Vancomycin >>   Dose adjustments this admission:   Microbiology results: 1/4 BCx: Pending  Plan:  - No loading dose indicated for SSTI  - Start Vancomycin 750 mg IV q24h  - Est Calc AUC 477 - Monitor patients renal function and cultures   Thank you for allowing pharmacy to be a part of this patient's care.  Duanne Limerick PharmD. BCPS  01/18/2019 7:52 AM

## 2019-01-18 NOTE — Consult Note (Signed)
ORTHOPAEDIC CONSULTATION  REQUESTING PHYSICIAN: Modena Jansky, MD  Chief complaint: Pain swelling drainage and cellulitis left transtibial amputation.  HPI: [name] is a 86 y.o. male who presents with cellulitis drainage and swelling left transtibial amputation.  Patient is status post left transtibial amputation 1 month ago.  Patient has been home and has acute episode of redness swelling cellulitis and pain.  Past Medical History:  Diagnosis Date  . Arthritis   . Borderline diabetic   . Diabetes mellitus without complication ([place])   . Glaucoma   . Hyperlipidemia   . Hypertension   . PVD (peripheral vascular disease) ([place])   . Sleep apnea    Cpap ordered but doesnt use   Past Surgical History:  Procedure Laterality Date  . ABDOMINAL AORTOGRAM W/LOWER EXTREMITY N/A 07/27/2018   Procedure: ABDOMINAL AORTOGRAM W/LOWER EXTREMITY;  Surgeon: Lorretta Harp, MD;  Location: [facility];  Service: Cardiovascular;  Laterality: N/A;  . AMPUTATION Right 08/28/2018   Procedure: RIGHT BELOW KNEE AMPUTATION;  Surgeon: [name], MD;  Location: [facility];  Service: Orthopedics;  Laterality: Right;  . AMPUTATION Left 12/02/2018   Procedure: LEFT TRANSMETATARSAL AMPUTATION AND NEGATIVE PRESSURE WOUND VAC PLACEMENT;  Surgeon: [name], MD;  Location: [place];  Service: Orthopedics;  Laterality: Left;  . AMPUTATION Left 12/18/2018   Procedure: LEFT BELOW KNEE AMPUTATION;  Surgeon: [name], MD;  Location: [place];  Service: Orthopedics;  Laterality: Left;  . APPENDECTOMY  1943  . BELOW KNEE LEG AMPUTATION Left 12/18/2018  . CATARACT EXTRACTION  2012   x2  . COLONOSCOPY N/A 11/01/2014   Procedure: COLONOSCOPY;  Surgeon: Aviva Signs, MD;  [facility];  Service: Gastroenterology;  Laterality: N/A;  . ESOPHAGOGASTRODUODENOSCOPY N/A 11/01/2014   Procedure: ESOPHAGOGASTRODUODENOSCOPY (EGD);  Surgeon: Aviva Signs, MD;  [facility];  Service:  Gastroenterology;  Laterality: N/A;  . HERNIA REPAIR Left   . INGUINAL HERNIA REPAIR Right 03/01/2013   Procedure: RIGHT INGUINAL HERNIORRHAPHY;  Surgeon: Jamesetta So, MD;  Location: [facility];  Service: General;  Laterality: Right;  . INSERTION OF MESH Right 03/01/2013   Procedure: INSERTION OF MESH;  Surgeon: Jamesetta So, MD;  [facility];  Service: General;  Laterality: Right;  . LOWER EXTREMITY ANGIOGRAPHY Right 08/25/2018   Procedure: LOWER EXTREMITY ANGIOGRAPHY;  Surgeon: Wellington Hampshire, MD;  Location: [facility];  Service: Cardiovascular;  Laterality: Right;  . LOWER EXTREMITY ANGIOGRAPHY N/A 11/25/2018   Procedure: LOWER EXTREMITY ANGIOGRAPHY;  Surgeon: Wellington Hampshire, MD;  Location: [facility];  Service: Cardiovascular;  Laterality: N/A;  . NM MYOCAR PERF WALL MOTION  06/05/2010   no significant ischemia  . PERIPHERAL VASCULAR ATHERECTOMY Right 08/03/2018   Procedure: PERIPHERAL VASCULAR ATHERECTOMY;  Surgeon: Lorretta Harp, MD;  Location: [facility];  Service: Cardiovascular;  Laterality: Right;  . PERIPHERAL VASCULAR ATHERECTOMY Left 11/23/2018   Procedure: PERIPHERAL VASCULAR ATHERECTOMY;  Surgeon: Lorretta Harp, MD;  Location: [facility];  Service: Cardiovascular;  Laterality: Left;  sfa with dc balloon  . PERIPHERAL VASCULAR BALLOON ANGIOPLASTY Left 11/25/2018   Procedure: PERIPHERAL VASCULAR BALLOON ANGIOPLASTY;  Surgeon: Wellington Hampshire, MD;  Location: [facility];  Service: Cardiovascular;  Laterality: Left;  popliteal  . PERIPHERAL VASCULAR INTERVENTION Left 11/25/2018   Procedure: PERIPHERAL VASCULAR INTERVENTION;  Surgeon: Wellington Hampshire, MD;  Location: [facility];  Service: Cardiovascular;  Laterality: Left;  tibial peroneal trunk and peroneal stents   . stent  06/14/2010   left leg  . US ECHOCARDIOGRAPHY  01/21/2006   moderate mitral annular ca+, mild MR, AOV moderately sclerotic.   Social History    Socioeconomic History  . Marital status: Widowed    Spouse name: Not on file  . Number of children: Not on file  . Years of education: Not on file  . Highest education level: Not on file  Occupational History  . Not on file  Tobacco Use  . Smoking status: Former Smoker    Quit date: 01/13/1965    Years since quitting: 54.0  . Smokeless tobacco: Former Systems developer    Types: Chew  Substance and Sexual Activity  . Alcohol use: No  . Drug use: No  . Sexual activity: Not Currently  Other Topics Concern  . Not on file  Social History Narrative  . Not on file   Social Determinants of Health   Financial Resource Strain: Low Risk   . Difficulty of Paying Living Expenses: Not hard at all  Food Insecurity: No Food Insecurity  . Worried About Charity fundraiser in the Last Year: Never true  . Ran Out of Food in the Last Year: Never true  Transportation Needs: No Transportation Needs  . Lack of Transportation (Medical): No  . Lack of Transportation (Non-Medical): No  Physical Activity: Inactive  . Days of Exercise per Week: 0 days  . Minutes of Exercise per Session: 0 min  Stress: Stress Concern Present  . Feeling of Stress : To some extent  Social Connections: Moderately Isolated  . Frequency of Communication with Friends and Family: More than three times a week  . Frequency of Social Gatherings with Friends and Family: More than three times a week  . Attends Religious Services: Never  . Active Member of Clubs or Organizations: No  . Attends Archivist Meetings: Never  . Marital Status: Widowed   Family History  Problem Relation Age of Onset  . Cancer Mother   . Heart attack Father   . Stroke Father   . Heart attack Brother   . Heart attack Brother    - negative except otherwise stated in the family history section Allergies  Allergen Reactions  . Iodinated Diagnostic Agents     Other reaction(s): Fever  . Dye Fdc Red [Red Dye] Hives    Tolerated red Docusate,  Niferex  . Iodine-131 Other (See Comments)    Breakout, fever  . Iohexol      Code: RASH, Desc: PT STATES HOT ALL OVER HAD TO HAVE ICE PACKS ALL OVER HIM AND DIFF BREATHING, PT REFUSED IV DYE    Prior to Admission medications   Medication Sig Start Date End Date Taking? Authorizing Provider  acetaminophen (TYLENOL) 500 MG tablet Take 1 tablet (500 mg total) by mouth 3 (three) times daily. Patient taking differently: Take 500 mg by mouth every 8 (eight) hours as needed for moderate pain.  01/05/19  Yes Love, Ivan Anchors, PA-C  apixaban (ELIQUIS) 5 MG TABS tablet Take 1 tablet (5 mg total) by mouth 2 (two) times daily. 12/09/18  Yes Mercy Riding, MD  aspirin EC 81 MG tablet Take 81 mg by mouth daily.   Yes [provider]  atorvastatin (LIPITOR) 40 MG tablet Take 1 tablet (40 mg total) by mouth daily. 09/22/18  Yes Angiulli, Lavon Paganini, PA-C  clopidogrel (PLAVIX) 75 MG tablet Take 1 tablet (75  mg total) by mouth daily with breakfast. 12/10/18  Yes Gonfa, Taye T, MD  diclofenac Sodium (VOLTAREN) 1 % GEL Apply 2 g topically 4 (four) times daily. Patient taking differently: Apply 2 g topically 2 (two) times daily as needed (pain).  01/05/19  Yes Love, Ivan Anchors, PA-C  docusate sodium (COLACE) 100 MG capsule Take 1 capsule (100 mg total) by mouth 2 (two) times daily. Patient taking differently: Take 100 mg by mouth every evening.  01/05/19  Yes Love, Ivan Anchors, PA-C  dorzolamide-timolol (COSOPT) 22.3-6.8 MG/ML ophthalmic solution Place 1 drop into both eyes 2 (two) times daily.   Yes [provider]  famotidine (PEPCID) 20 MG tablet Take 1 tablet (20 mg total) by mouth daily. 11/12/18  Yes Johnson, Clanford L, MD  ferrous sulfate 325 (65 FE) MG tablet Take 325 mg by mouth 2 (two) times daily with a meal.   Yes [provider]  finasteride (PROSCAR) 5 MG tablet Take 1 tablet (5 mg total) by mouth every evening. 09/22/18  Yes Angiulli, Lavon Paganini, PA-C  furosemide (LASIX) 40 MG tablet  Take 1 tablet (40 mg total) by mouth daily. 12/09/18  Yes Mercy Riding, MD  glipiZIDE (GLUCOTROL) 5 MG tablet Take 5 mg by mouth 2 (two) times daily.    Yes [provider]  levothyroxine (SYNTHROID) 50 MCG tablet Take 1 tablet (50 mcg total) by mouth daily before breakfast. 09/22/18  Yes Angiulli, Lavon Paganini, PA-C  Misc Natural Products (GLUCOSAMINE CHOND MSM FORMULA PO) Take 1 tablet by mouth 2 (two) times daily.   Yes [provider]  Omega-3 Fatty Acids (FISH OIL PO) Take 1 capsule by mouth daily.   Yes [provider]  polyethylene glycol (MIRALAX) 17 g packet Take 17 g by mouth daily as needed for moderate constipation. 12/09/18  Yes Mercy Riding, MD  potassium chloride (KLOR-CON) 10 MEQ tablet Take 1 tablet (10 mEq total) by mouth daily. 11/12/18  Yes Johnson, Clanford L, MD  pyridoxine (B-6) 100 MG tablet Take 100 mg by mouth daily.   Yes [provider]  senna-docusate (SENOKOT-S) 8.6-50 MG tablet Take 2 tablets by mouth at bedtime. 01/05/19  Yes Love, Ivan Anchors, PA-C  tamsulosin (FLOMAX) 0.4 MG CAPS capsule Take 1 capsule (0.4 mg total) by mouth 2 (two) times daily. 09/22/18  Yes Angiulli, Lavon Paganini, PA-C  traMADol (ULTRAM) 50 MG tablet Take 1 tablet (50 mg total) by mouth every 6 (six) hours as needed for severe pain. 01/05/19  Yes Love, Ivan Anchors, PA-C  vitamin C (ASCORBIC ACID) 500 MG tablet Take 500 mg by mouth daily.   Yes [provider]  vitamin E 400 UNIT capsule Take 400 Units by mouth daily.   Yes [provider]   No results found. - pertinent xrays, CT, MRI studies were reviewed and independently interpreted  Positive ROS: All other systems have been reviewed and were otherwise negative with the exception of those mentioned in the HPI and as above.  Physical Exam: General: Alert, no acute distress Psychiatric: Patient is competent for consent with normal mood and affect Lymphatic: No axillary or cervical  lymphadenopathy Cardiovascular: No pedal edema Respiratory: No cyanosis, no use of accessory musculature GI: No organomegaly, abdomen is soft and non-tender    Images:  @ENCIMAGES @  Labs:  Lab Results  Component Value Date   HGBA1C 5.9 (H) 11/09/2018   HGBA1C 7.0 (H) 05/29/2018   HGBA1C 8.1 (H) 07/25/2017   ESRSEDRATE 84 (H) 11/18/2018  CRP 5.1 (H) 11/18/2018   CRP <0.8 05/29/2018   REPTSTATUS 12/30/2018 FINAL 12/29/2018   CULT  12/29/2018    NO GROWTH Performed at [facility], [place] [address], [place], [place] [address]    LABORGA ESCHERICHIA COLI (A) 09/28/2018    Lab Results  Component Value Date   ALBUMIN 3.0 (L) 01/17/2019   ALBUMIN 2.8 (L) 12/26/2018   ALBUMIN 2.9 (L) 12/10/2018    Neurologic: Patient does not have protective sensation bilateral lower extremities.   MUSCULOSKELETAL:   Skin: Examination there is swelling and cellulitis of the transtibial amputation there is no wound dehiscence or gangrenous changes.  Patient has swelling up to the knee but the cellulitis does not extend up into the knee joint.  There is no effusion.  Assessment: Assessment: Acute cellulitis left transtibial amputation.  Plan: Plan: Agree with admission IV antibiotics I will reevaluate tomorrow if not showing significant improvement would plan for revision of the transtibial amputation with surgical irrigation and debridement.  This was discussed with the patient and family at bedside.  Thank you for the consult and the opportunity to see Mr. Prophet Renwick, [place] [phone] 7:39 AM

## 2019-01-18 NOTE — Telephone Encounter (Signed)
Sorry to hear about the amputation. Eliquis dose is based on combination of age, weight and creatinine. If this has changed, the dose may need to be adjusted. I would recommend they proceed with the dose recommended at the hospital.  Dr Lemmie Evens

## 2019-01-18 NOTE — Progress Notes (Signed)
PROGRESS NOTE   [name]  GUR:[id number]    DOB: 02-27-1927    DOA: 01/17/2019  PCP: Redmond School, MD   I have briefly reviewed patients previous medical records in [facility].  Chief Complaint:   Chief Complaint  Patient presents with  . Leg Pain    LBKA bleeding and on ELIQUIS    Brief Narrative:   86 y.o. male with medical history significant for paroxysmal atrial fibrillation on Eliquis, type 2 diabetes mellitus, peripheral arterial disease, status post bilateral BKA with left BKA performed on 12/18/2018, now presenting to the emergency department with pain and redness at the left BKA stump with severe pain, bloody and malodorous drainage from the incision site.  Admitted for left BKA postop wound infection/cellulitis.  Treating with empiric antibiotics.  Orthopedics/Dr. Sharol Given consulted and if does not improve, plans surgical I&D on 1/6.  Eliquis held in case surgery needed and switched to IV heparin.   Assessment & Plan:  Active Problems:   Essential hypertension, benign   Hypothyroidism   Chronic diastolic CHF (congestive heart failure) (H[facility])   PAF (paroxysmal atrial fibrillation) ([facility])   Type 2 diabetes mellitus with vascular disease ([facility])   Chronic kidney disease, stage 3a   Problem involving surgical incision   Anemia   Cellulitis   Left BKA postop wound infection/cellulitis.  S/p left BKA 12/18/2018.  No sepsis features on admission.  Follow blood cultures.  Empirically started on IV ceftriaxone, metronidazole and vancomycin per pharmacy per focused lower extremity wound order set.  Concern for MRSA but not Pseudomonas.  Dr. Farley Ly input appreciated, recommends continuing IV antibiotics, will reassess 1/5 and if does not show significant improvement then plans revision of transtibial amputation with surgical I&D.  I discussed with Dr. Sharol Given and if needed plans surgery on Wednesday 1/6.  Recommends continuing to hold Eliquis and okay with  transitioning to IV heparin which can be turned off couple of hours prior to surgery.  Antiplatelets on hold.  Paroxysmal A. Fib  CHA2DS2-VASc score: At least 5.  Hold Eliquis.  IV heparin pending possible surgery.  Appears to be in sinus rhythm.  Does not appear to be on any rate control medications at home.  Chronic diastolic CHF  TTE 2/83/1517: LVEF 55-60%.  Continue prior home dose of Lasix.  CKD stage IIIa  Baseline creatinine may be in the 1.1-1.2 range.  Close to baseline.  Continue Lasix and follow BMP.  Hypothyroidism  Continue Synthroid  PAD  Hold aspirin and Plavix for possible surgical intervention  Type II DM  A1c 5.9 in 10/2018.  Hold home glipizide.  SSI and adjust as needed.  Anemia of chronic disease  Stable.   DVT prophylaxis: IV heparin Code Status: DNR Family Communication: None at bedside Disposition: To be determined pending clinical improvement   Consultants:   Orthopedic/Dr. Sharol Given  Procedures:   None  Antimicrobials:   IV ceftriaxone, metronidazole and vancomycin   Subjective:  Patient seen this morning in the ED.  Denied complaints.  Denied pain at left lower extremity wound site.  Denied chest pain, dyspnea or palpitations.  Objective:   Vitals:   01/17/19 2207 01/18/19 0309 01/18/19 0705 01/18/19 1327  BP: (!) 132/54 (!) 137/58  (!) 128/52  Pulse: 77 61 (!) 59 (!) 57  Resp: 18 16 18 16   Temp:  98.2 F (36.8 C)  97.7 F (36.5 C)  TempSrc:  Oral  Oral  SpO2: 100% 100% 98% 98%    General  exam: Pleasant elderly male, moderately built and nourished lying comfortably propped up in bed without distress. Respiratory system: Clear to auscultation. Respiratory effort normal. Cardiovascular system: S1 & S2 heard, RRR. No JVD, rubs, gallops or clicks. No pedal edema.  Systolic murmur 3/6 best heard at apex. Gastrointestinal system: Abdomen is nondistended, soft and nontender. No organomegaly or masses felt. Normal bowel sounds  heard. Central nervous system: Alert and oriented x2. No focal neurological deficits. Extremities: Symmetric 5 x 5 power.  Right BKA with stump shrinker.  Left BKA with diffuse erythema, couple of areas of dark discoloration, staples intact.  Some foul odor but no overt drainage.  Mild tenderness but no fluctuation or crepitus.  Please see pictures below taken on day of admission. Skin: As above. Psychiatry: Judgement and insight appear somewhat impaired. Mood & affect appropriate.         Data Reviewed:   I have personally reviewed following labs and imaging studies   CBC: Recent Labs  Lab 01/17/19 1632 01/18/19 0623  WBC 12.0* 12.1*  NEUTROABS 6.9  --   HGB 9.7* 9.9*  HCT 31.1* 30.8*  MCV 100.3* 96.3  PLT 253 382    Basic Metabolic Panel: Recent Labs  Lab 01/17/19 1632 01/18/19 0623  NA 135 131*  K 3.8 3.7  CL 100 95*  CO2 26 27  GLUCOSE 165* 133*  BUN 25* 25*  CREATININE 1.34* 1.29*  CALCIUM 8.5* 8.6*    Liver Function Tests: Recent Labs  Lab 01/17/19 1632  AST 21  ALT 17  ALKPHOS 64  BILITOT 0.6  PROT 6.4*  ALBUMIN 3.0*    CBG: No results for input(s): GLUCAP in the last 168 hours.  Microbiology Studies:   Recent Results (from the past 240 hour(s))  SARS CORONAVIRUS 2 (TAT 6-24 HRS) Nasopharyngeal Nasopharyngeal Swab     Status: None   Collection Time: 01/18/19  7:05 AM   Specimen: Nasopharyngeal Swab  Result Value Ref Range Status   SARS Coronavirus 2 NEGATIVE NEGATIVE Final    Comment: (NOTE) SARS-CoV-2 target nucleic acids are NOT DETECTED. The SARS-CoV-2 RNA is generally detectable in upper and lower respiratory specimens during the acute phase of infection. Negative results do not preclude SARS-CoV-2 infection, do not rule out co-infections with other pathogens, and should not be used as the sole basis for treatment or other patient management decisions. Negative results must be combined with clinical observations, patient history,  and epidemiological information. The expected result is Negative. Fact Sheet for Patients: SugarRoll.be Fact Sheet for Healthcare Providers: https://www.woods-mathews.com/ This test is not yet approved or cleared by the [place] FDA and  has been authorized for detection and/or diagnosis of SARS-CoV-2 by FDA under an Emergency Use Authorization (EUA). This EUA will remain  in effect (meaning this test can be used) for the duration of the COVID-19 declaration under Section 56 4(b)(1) of the Act, 21 U.S.C. section 360bbb-3(b)(1), unless the authorization is terminated or revoked sooner. Performed at [facility], [place] [address] [place], [place] [address]      Radiology Studies:  No results found.   Scheduled Meds:   . atorvastatin  40 mg Oral Daily  . docusate sodium  100 mg Oral QPM  . dorzolamide-timolol  1 drop Both Eyes BID  . famotidine  20 mg Oral Daily  . finasteride  5 mg Oral QPM  . insulin aspart  0-9 Units Subcutaneous Q4H  . levothyroxine  50 mcg Oral QAC breakfast  . senna-docusate  2 tablet Oral QHS  . sodium chloride flush  3 mL Intravenous Once  . sodium chloride flush  3 mL Intravenous Q12H  . tamsulosin  0.4 mg Oral BID    Continuous Infusions:   . sodium chloride    . cefTRIAXone (ROCEPHIN)  IV Stopped (01/18/19 0858)  . metronidazole Stopped (01/18/19 1045)  . vancomycin Stopped (01/18/19 1217)     LOS: 0 days     Vernell Leep, MD, [place], [facility]. [facility]    To contact the attending provider between 7A-7P or the covering provider during after hours 7P-7A, please log into the web site [email or web address] and access using universal [place] password for that web site. If you do not have the password, please call the hospital operator.  01/18/2019, 1:57 PM

## 2019-01-18 NOTE — Telephone Encounter (Signed)
Pt currently in hospital.

## 2019-01-18 NOTE — H&P (Signed)
History and Physical    [name] GHW:[id number] DOB: Nov 24, 1927 DOA: 01/17/2019  PCP: Redmond School, MD   Patient coming from: Home   Chief Complaint: Pain, redness, bleeding, drainage from left BKA stump   HPI: [name] is a 86 y.o. male with medical history significant for paroxysmal atrial fibrillation on Eliquis, type 2 diabetes mellitus, peripheral arterial disease, status post bilateral BKA with left BKA performed on 12/18/2018, now presenting to the emergency department with pain and redness at the left BKA stump with bloody and malodorous drainage from the incision site.  Patient's daughter is at the bedside and assists with the history.  There had been some scant amount of bleeding from the incision site since the surgery, but no pain until 2 days ago.  Over the past 2 days, there has been redness around the incision, severe pain when the site is touched, and the drainage has developed a foul odor.  Patient denies any fevers, chills, cough, shortness of breath, or chest pain.  ED Course: Upon arrival to the ED, patient is found to be afebrile, saturating well on room air, and with stable blood pressure.  Chemistry panel is notable for creatinine of 1.34, slightly up from recent priors.  CBC with leukocytosis to 12,000 and stable anemia with hemoglobin 9.7.  Blood cultures were collected in the emergency department, COVID-19 screening test was sent off, and orthopedic surgery was consulted by the ED physician with medical admission recommended.  Review of Systems:  All other systems reviewed and apart from HPI, are negative.  Past Medical History:  Diagnosis Date  . Arthritis   . Borderline diabetic   . Diabetes mellitus without complication ([place])   . Glaucoma   . Hyperlipidemia   . Hypertension   . PVD (peripheral vascular disease) ([place])   . Sleep apnea    Cpap ordered but doesnt use    Past Surgical History:  Procedure Laterality Date  . ABDOMINAL AORTOGRAM  W/LOWER EXTREMITY N/A 07/27/2018   Procedure: ABDOMINAL AORTOGRAM W/LOWER EXTREMITY;  Surgeon: Lorretta Harp, MD;  Location: [facility];  Service: Cardiovascular;  Laterality: N/A;  . AMPUTATION Right 08/28/2018   Procedure: RIGHT BELOW KNEE AMPUTATION;  Surgeon: [name], MD;  Location: [facility];  Service: Orthopedics;  Laterality: Right;  . AMPUTATION Left 12/02/2018   Procedure: LEFT TRANSMETATARSAL AMPUTATION AND NEGATIVE PRESSURE WOUND VAC PLACEMENT;  Surgeon: [name], MD;  Location: [facility];  Service: Orthopedics;  Laterality: Left;  . AMPUTATION Left 12/18/2018   Procedure: LEFT BELOW KNEE AMPUTATION;  Surgeon: [name], MD;  Location: [facility];  Service: Orthopedics;  Laterality: Left;  . APPENDECTOMY  1943  . BELOW KNEE LEG AMPUTATION Left 12/18/2018  . CATARACT EXTRACTION  2012   x2  . COLONOSCOPY N/A 11/01/2014   Procedure: COLONOSCOPY;  Surgeon: Aviva Signs, MD;  [facility];  Service: Gastroenterology;  Laterality: N/A;  . ESOPHAGOGASTRODUODENOSCOPY N/A 11/01/2014   Procedure: ESOPHAGOGASTRODUODENOSCOPY (EGD);  Surgeon: Aviva Signs, MD;  [facility];  Service: Gastroenterology;  Laterality: N/A;  . HERNIA REPAIR Left   . INGUINAL HERNIA REPAIR Right 03/01/2013   Procedure: RIGHT INGUINAL HERNIORRHAPHY;  Surgeon: Jamesetta So, MD;  Location: [facility];  Service: General;  Laterality: Right;  . INSERTION OF MESH Right 03/01/2013   Procedure: INSERTION OF MESH;  Surgeon: Jamesetta So, MD;  Location[facility];  Service: General;  Laterality: Right;  . LOWER EXTREMITY ANGIOGRAPHY Right  08/25/2018   Procedure: LOWER EXTREMITY ANGIOGRAPHY;  Surgeon: Wellington Hampshire, MD;  Location: [facility];  Service: Cardiovascular;  Laterality: Right;  . LOWER EXTREMITY ANGIOGRAPHY N/A 11/25/2018   Procedure: LOWER EXTREMITY ANGIOGRAPHY;  Surgeon: Wellington Hampshire, MD;  Location: [facility];  Service: Cardiovascular;  Laterality: N/A;  .  NM MYOCAR PERF WALL MOTION  06/05/2010   no significant ischemia  . PERIPHERAL VASCULAR ATHERECTOMY Right 08/03/2018   Procedure: PERIPHERAL VASCULAR ATHERECTOMY;  Surgeon: Lorretta Harp, MD;  Location: [facility];  Service: Cardiovascular;  Laterality: Right;  . PERIPHERAL VASCULAR ATHERECTOMY Left 11/23/2018   Procedure: PERIPHERAL VASCULAR ATHERECTOMY;  Surgeon: Lorretta Harp, MD;  Location: [facility];  Service: Cardiovascular;  Laterality: Left;  sfa with dc balloon  . PERIPHERAL VASCULAR BALLOON ANGIOPLASTY Left 11/25/2018   Procedure: PERIPHERAL VASCULAR BALLOON ANGIOPLASTY;  Surgeon: Wellington Hampshire, MD;  Location: [facility];  Service: Cardiovascular;  Laterality: Left;  popliteal  . PERIPHERAL VASCULAR INTERVENTION Left 11/25/2018   Procedure: PERIPHERAL VASCULAR INTERVENTION;  Surgeon: Wellington Hampshire, MD;  Location: [facility];  Service: Cardiovascular;  Laterality: Left;  tibial peroneal trunk and peroneal stents   . stent  06/14/2010   left leg  . US ECHOCARDIOGRAPHY  01/21/2006   moderate mitral annular ca+, mild MR, AOV moderately sclerotic.     reports that he quit smoking about 54 years ago. He has quit using smokeless tobacco.  His smokeless tobacco use included chew. He reports that he does not drink alcohol or use drugs.  Allergies  Allergen Reactions  . Iodinated Diagnostic Agents     Other reaction(s): Fever  . Dye Fdc Red [Red Dye] Hives    Tolerated red Docusate, Niferex  . Iodine-131 Other (See Comments)    Breakout, fever  . Iohexol      Code: RASH, Desc: PT STATES HOT ALL OVER HAD TO HAVE ICE PACKS ALL OVER HIM AND DIFF BREATHING, PT REFUSED IV DYE     Family History  Problem Relation Age of Onset  . Cancer Mother   . Heart attack Father   . Stroke Father   . Heart attack Brother   . Heart attack Brother      Prior to Admission medications   Medication Sig Start Date End Date Taking? Authorizing Provider   acetaminophen (TYLENOL) 500 MG tablet Take 1 tablet (500 mg total) by mouth 3 (three) times daily. Patient taking differently: Take 500 mg by mouth every 8 (eight) hours as needed for moderate pain.  01/05/19  Yes Love, Ivan Anchors, PA-C  apixaban (ELIQUIS) 5 MG TABS tablet Take 1 tablet (5 mg total) by mouth 2 (two) times daily. 12/09/18  Yes Mercy Riding, MD  aspirin EC 81 MG tablet Take 81 mg by mouth daily.   Yes [provider]  atorvastatin (LIPITOR) 40 MG tablet Take 1 tablet (40 mg total) by mouth daily. 09/22/18  Yes Angiulli, Lavon Paganini, PA-C  clopidogrel (PLAVIX) 75 MG tablet Take 1 tablet (75 mg total) by mouth daily with breakfast. 12/10/18  Yes Gonfa, Taye T, MD  diclofenac Sodium (VOLTAREN) 1 % GEL Apply 2 g topically 4 (four) times daily. Patient taking differently: Apply 2 g topically 2 (two) times daily as needed (pain).  01/05/19  Yes Love, Ivan Anchors, PA-C  docusate sodium (COLACE) 100 MG capsule Take 1 capsule (100 mg total) by mouth 2 (two) times daily. Patient taking  differently: Take 100 mg by mouth every evening.  01/05/19  Yes Love, Ivan Anchors, PA-C  dorzolamide-timolol (COSOPT) 22.3-6.8 MG/ML ophthalmic solution Place 1 drop into both eyes 2 (two) times daily.   Yes [provider]  famotidine (PEPCID) 20 MG tablet Take 1 tablet (20 mg total) by mouth daily. 11/12/18  Yes Johnson, Clanford L, MD  ferrous sulfate 325 (65 FE) MG tablet Take 325 mg by mouth 2 (two) times daily with a meal.   Yes [provider]  finasteride (PROSCAR) 5 MG tablet Take 1 tablet (5 mg total) by mouth every evening. 09/22/18  Yes Angiulli, Lavon Paganini, PA-C  furosemide (LASIX) 40 MG tablet Take 1 tablet (40 mg total) by mouth daily. 12/09/18  Yes Mercy Riding, MD  glipiZIDE (GLUCOTROL) 5 MG tablet Take 5 mg by mouth 2 (two) times daily.    Yes [provider]  levothyroxine (SYNTHROID) 50 MCG tablet Take 1 tablet (50 mcg total) by mouth daily before breakfast. 09/22/18  Yes  Angiulli, Lavon Paganini, PA-C  Misc Natural Products (GLUCOSAMINE CHOND MSM FORMULA PO) Take 1 tablet by mouth 2 (two) times daily.   Yes [provider]  Omega-3 Fatty Acids (FISH OIL PO) Take 1 capsule by mouth daily.   Yes [provider]  polyethylene glycol (MIRALAX) 17 g packet Take 17 g by mouth daily as needed for moderate constipation. 12/09/18  Yes Mercy Riding, MD  potassium chloride (KLOR-CON) 10 MEQ tablet Take 1 tablet (10 mEq total) by mouth daily. 11/12/18  Yes Johnson, Clanford L, MD  pyridoxine (B-6) 100 MG tablet Take 100 mg by mouth daily.   Yes [provider]  senna-docusate (SENOKOT-S) 8.6-50 MG tablet Take 2 tablets by mouth at bedtime. 01/05/19  Yes Love, Ivan Anchors, PA-C  tamsulosin (FLOMAX) 0.4 MG CAPS capsule Take 1 capsule (0.4 mg total) by mouth 2 (two) times daily. 09/22/18  Yes Angiulli, Lavon Paganini, PA-C  traMADol (ULTRAM) 50 MG tablet Take 1 tablet (50 mg total) by mouth every 6 (six) hours as needed for severe pain. 01/05/19  Yes Love, Ivan Anchors, PA-C  vitamin C (ASCORBIC ACID) 500 MG tablet Take 500 mg by mouth daily.   Yes [provider]  vitamin E 400 UNIT capsule Take 400 Units by mouth daily.   Yes [provider]    Physical Exam: Vitals:   01/17/19 1456 01/17/19 1623 01/17/19 2207 01/18/19 0309  BP: (!) 149/59 (!) 142/62 (!) 132/54 (!) 137/58  Pulse: 64  77 61  Resp: 16  18 16   Temp: 98.5 F (36.9 C)   98.2 F (36.8 C)  TempSrc: Oral   Oral  SpO2: 100%  100% 100%    Constitutional: NAD, calm  Eyes: PERTLA, lids and conjunctivae normal ENMT: Mucous membranes are moist. Posterior pharynx clear of any exudate or lesions.   Neck: normal, supple, no masses, no thyromegaly Respiratory: no wheezing, no crackles. Normal respiratory effort. No accessory muscle use.  Cardiovascular: S1 & S2 heard, regular rate and rhythm. No significant JVD. Abdomen: No distension, no tenderness, soft. Bowel sounds active.   Musculoskeletal: no clubbing / cyanosis. Status-post bilateral BKA.   Skin: Erythema and tenderness involving left BKA stump with bloody drainage from incision. Warm, dry, well-perfused. Neurologic: No facial asymmetry. Sensation intact. Moving all extremities.  Psychiatric:  Alert, answers basic questions appropriately. Very pleasant and cooperative.    Labs on Admission: I have personally reviewed following labs and imaging studies  CBC: Recent  Labs  Lab 01/17/19 1632  WBC 12.0*  NEUTROABS 6.9  HGB 9.7*  HCT 31.1*  MCV 100.3*  PLT 409   Basic Metabolic Panel: Recent Labs  Lab 01/17/19 1632  NA 135  K 3.8  CL 100  CO2 26  GLUCOSE 165*  BUN 25*  CREATININE 1.34*  CALCIUM 8.5*   GFR: Estimated Creatinine Clearance: 39.8 mL/min (A) (by C-G formula based on SCr of 1.34 mg/dL (H)). Liver Function Tests: Recent Labs  Lab 01/17/19 1632  AST 21  ALT 17  ALKPHOS 64  BILITOT 0.6  PROT 6.4*  ALBUMIN 3.0*   No results for input(s): LIPASE, AMYLASE in the last 168 hours. No results for input(s): AMMONIA in the last 168 hours. Coagulation Profile: No results for input(s): INR, PROTIME in the last 168 hours. Cardiac Enzymes: No results for input(s): CKTOTAL, CKMB, CKMBINDEX, TROPONINI in the last 168 hours. BNP (last 3 results) No results for input(s): PROBNP in the last 8760 hours. HbA1C: No results for input(s): HGBA1C in the last 72 hours. CBG: No results for input(s): GLUCAP in the last 168 hours. Lipid Profile: No results for input(s): CHOL, HDL, LDLCALC, TRIG, CHOLHDL, LDLDIRECT in the last 72 hours. Thyroid Function Tests: No results for input(s): TSH, T4TOTAL, FREET4, T3FREE, THYROIDAB in the last 72 hours. Anemia Panel: No results for input(s): VITAMINB12, FOLATE, FERRITIN, TIBC, IRON, RETICCTPCT in the last 72 hours. Urine analysis:    Component Value Date/Time   COLORURINE YELLOW 01/02/2019 1427   APPEARANCEUR HAZY (A) 01/02/2019 1427   LABSPEC 1.011  01/02/2019 1427   PHURINE 6.0 01/02/2019 1427   GLUCOSEU NEGATIVE 01/02/2019 1427   HGBUR MODERATE (A) 01/02/2019 1427   BILIRUBINUR NEGATIVE 01/02/2019 1427   KETONESUR NEGATIVE 01/02/2019 1427   PROTEINUR 30 (A) 01/02/2019 1427   NITRITE NEGATIVE 01/02/2019 1427   LEUKOCYTESUR LARGE (A) 01/02/2019 1427   Sepsis Labs: @LABRCNTIP (procalcitonin:4,lacticidven:4) )No results found for this or any previous visit (from the past 240 hour(s)).   Radiological Exams on Admission: No results found.   Assessment/Plan   1. Surgical site infection  - Patient underwent left BKA on 12/18/18 and now presents with 2 days of bloody and malodorous drainage from left BKA incision with surrounding erythema and tenderness  - No fevers or chills, lactate normal, and WBC 12,000  - Blood cultures collected in ED orthopedic surgery was consulted   - Last dose of Eliquis was the morning of 01/17/19  - Hold antiplatelets and anticoagulation pending surgeon's assessment, keep NPO for now    2. Paroxysmal atrial fibrillation  - CHADS-VASc is (age x2, CHF, DM, PAD) - Hold Eliquis pending surgeon's evaluation, start IV heparin if he will need to remain off of Eliquis for surgery    3. CKD IIIa  - SCr is 1.34 on admission, slightly up from priors  - Renally-dose medications, hold Lasix while NPO    4. Chronic diastolic CHF  - Appears compensated  - Hold Lasix while NPO    5. Hypothyroidism  - Continue Synthroid   6. PAD  - Hold ASA and Plavix pending surgeon's evaluation   7. Type II DM  - A1c was 5.9% in October 2020, managed with glipizide at home  - Hold glipizide, monitor CBG's, and use a low-intensity SSI with insulin as needed for now    DVT prophylaxis: Eliquis pta  Code Status: DNR  Family Communication: Daughter updated at bedside   Consults called: Orthopedic surgery  Admission status: observation  Vianne Bulls, MD [facility] Pager [phone]  If 7PM-7AM, please  contact night-coverage [email or web address] Password [facility]  01/18/2019, 6:17 AM

## 2019-01-18 NOTE — Progress Notes (Signed)
1610 received pt from ED via bed, A&O x4. Left stump incision with staples intact, scant drainage noted.

## 2019-01-18 NOTE — H&P (View-Only) (Signed)
ORTHOPAEDIC CONSULTATION  REQUESTING PHYSICIAN: Modena Jansky, MD  Chief complaint: Pain swelling drainage and cellulitis left transtibial amputation.  HPI: [name] is a 86 y.o. male who presents with cellulitis drainage and swelling left transtibial amputation.  Patient is status post left transtibial amputation 1 month ago.  Patient has been home and has acute episode of redness swelling cellulitis and pain.  Past Medical History:  Diagnosis Date  . Arthritis   . Borderline diabetic   . Diabetes mellitus without complication ([place])   . Glaucoma   . Hyperlipidemia   . Hypertension   . PVD (peripheral vascular disease) ([place])   . Sleep apnea    Cpap ordered but doesnt use   Past Surgical History:  Procedure Laterality Date  . ABDOMINAL AORTOGRAM W/LOWER EXTREMITY N/A 07/27/2018   Procedure: ABDOMINAL AORTOGRAM W/LOWER EXTREMITY;  Surgeon: Lorretta Harp, MD;  Location: [facility];  Service: Cardiovascular;  Laterality: N/A;  . AMPUTATION Right 08/28/2018   Procedure: RIGHT BELOW KNEE AMPUTATION;  Surgeon: [name], MD;  Location: [facility];  Service: Orthopedics;  Laterality: Right;  . AMPUTATION Left 12/02/2018   Procedure: LEFT TRANSMETATARSAL AMPUTATION AND NEGATIVE PRESSURE WOUND VAC PLACEMENT;  Surgeon: [name], MD;  Location: [facility];  Service: Orthopedics;  Laterality: Left;  . AMPUTATION Left 12/18/2018   Procedure: LEFT BELOW KNEE AMPUTATION;  Surgeon: [name], MD;  Location: [facility];  Service: Orthopedics;  Laterality: Left;  . APPENDECTOMY  1943  . BELOW KNEE LEG AMPUTATION Left 12/18/2018  . CATARACT EXTRACTION  2012   x2  . COLONOSCOPY N/A 11/01/2014   Procedure: COLONOSCOPY;  Surgeon: Aviva Signs, MD;  [facility];  Service: Gastroenterology;  Laterality: N/A;  . ESOPHAGOGASTRODUODENOSCOPY N/A 11/01/2014   Procedure: ESOPHAGOGASTRODUODENOSCOPY (EGD);  Surgeon: Aviva Signs, MD;  [facility];  Service:  Gastroenterology;  Laterality: N/A;  . HERNIA REPAIR Left   . INGUINAL HERNIA REPAIR Right 03/01/2013   Procedure: RIGHT INGUINAL HERNIORRHAPHY;  Surgeon: Jamesetta So, MD;  Location: [facility];  Service: General;  Laterality: Right;  . INSERTION OF MESH Right 03/01/2013   Procedure: INSERTION OF MESH;  Surgeon: Jamesetta So, MD;  [facility];  Service: General;  Laterality: Right;  . LOWER EXTREMITY ANGIOGRAPHY Right 08/25/2018   Procedure: LOWER EXTREMITY ANGIOGRAPHY;  Surgeon: Wellington Hampshire, MD;  Location: [facility];  Service: Cardiovascular;  Laterality: Right;  . LOWER EXTREMITY ANGIOGRAPHY N/A 11/25/2018   Procedure: LOWER EXTREMITY ANGIOGRAPHY;  Surgeon: Wellington Hampshire, MD;  Location: [facility];  Service: Cardiovascular;  Laterality: N/A;  . NM MYOCAR PERF WALL MOTION  06/05/2010   no significant ischemia  . PERIPHERAL VASCULAR ATHERECTOMY Right 08/03/2018   Procedure: PERIPHERAL VASCULAR ATHERECTOMY;  Surgeon: Lorretta Harp, MD;  Location: [facility];  Service: Cardiovascular;  Laterality: Right;  . PERIPHERAL VASCULAR ATHERECTOMY Left 11/23/2018   Procedure: PERIPHERAL VASCULAR ATHERECTOMY;  Surgeon: Lorretta Harp, MD;  Location: [facility];  Service: Cardiovascular;  Laterality: Left;  sfa with dc balloon  . PERIPHERAL VASCULAR BALLOON ANGIOPLASTY Left 11/25/2018   Procedure: PERIPHERAL VASCULAR BALLOON ANGIOPLASTY;  Surgeon: Wellington Hampshire, MD;  Location: [facility];  Service: Cardiovascular;  Laterality: Left;  popliteal  . PERIPHERAL VASCULAR INTERVENTION Left 11/25/2018   Procedure: PERIPHERAL VASCULAR INTERVENTION;  Surgeon: Wellington Hampshire, MD;  Location: [facility];  Service: Cardiovascular;  Laterality: Left;  tibial peroneal trunk and peroneal stents   . stent  06/14/2010   left leg  . US ECHOCARDIOGRAPHY  01/21/2006   moderate mitral annular ca+, mild MR, AOV moderately sclerotic.   Social History    Socioeconomic History  . Marital status: Widowed    Spouse name: Not on file  . Number of children: Not on file  . Years of education: Not on file  . Highest education level: Not on file  Occupational History  . Not on file  Tobacco Use  . Smoking status: Former Smoker    Quit date: 01/13/1965    Years since quitting: 54.0  . Smokeless tobacco: Former Systems developer    Types: Chew  Substance and Sexual Activity  . Alcohol use: No  . Drug use: No  . Sexual activity: Not Currently  Other Topics Concern  . Not on file  Social History Narrative  . Not on file   Social Determinants of Health   Financial Resource Strain: Low Risk   . Difficulty of Paying Living Expenses: Not hard at all  Food Insecurity: No Food Insecurity  . Worried About Charity fundraiser in the Last Year: Never true  . Ran Out of Food in the Last Year: Never true  Transportation Needs: No Transportation Needs  . Lack of Transportation (Medical): No  . Lack of Transportation (Non-Medical): No  Physical Activity: Inactive  . Days of Exercise per Week: 0 days  . Minutes of Exercise per Session: 0 min  Stress: Stress Concern Present  . Feeling of Stress : To some extent  Social Connections: Moderately Isolated  . Frequency of Communication with Friends and Family: More than three times a week  . Frequency of Social Gatherings with Friends and Family: More than three times a week  . Attends Religious Services: Never  . Active Member of Clubs or Organizations: No  . Attends Archivist Meetings: Never  . Marital Status: Widowed   Family History  Problem Relation Age of Onset  . Cancer Mother   . Heart attack Father   . Stroke Father   . Heart attack Brother   . Heart attack Brother    - negative except otherwise stated in the family history section Allergies  Allergen Reactions  . Iodinated Diagnostic Agents     Other reaction(s): Fever  . Dye Fdc Red [Red Dye] Hives    Tolerated red Docusate,  Niferex  . Iodine-131 Other (See Comments)    Breakout, fever  . Iohexol      Code: RASH, Desc: PT STATES HOT ALL OVER HAD TO HAVE ICE PACKS ALL OVER HIM AND DIFF BREATHING, PT REFUSED IV DYE    Prior to Admission medications   Medication Sig Start Date End Date Taking? Authorizing Provider  acetaminophen (TYLENOL) 500 MG tablet Take 1 tablet (500 mg total) by mouth 3 (three) times daily. Patient taking differently: Take 500 mg by mouth every 8 (eight) hours as needed for moderate pain.  01/05/19  Yes Love, Ivan Anchors, PA-C  apixaban (ELIQUIS) 5 MG TABS tablet Take 1 tablet (5 mg total) by mouth 2 (two) times daily. 12/09/18  Yes Mercy Riding, MD  aspirin EC 81 MG tablet Take 81 mg by mouth daily.   Yes [provider]  atorvastatin (LIPITOR) 40 MG tablet Take 1 tablet (40 mg total) by mouth daily. 09/22/18  Yes Angiulli, Lavon Paganini, PA-C  clopidogrel (PLAVIX) 75 MG tablet Take 1 tablet (75  mg total) by mouth daily with breakfast. 12/10/18  Yes Gonfa, Taye T, MD  diclofenac Sodium (VOLTAREN) 1 % GEL Apply 2 g topically 4 (four) times daily. Patient taking differently: Apply 2 g topically 2 (two) times daily as needed (pain).  01/05/19  Yes Love, Ivan Anchors, PA-C  docusate sodium (COLACE) 100 MG capsule Take 1 capsule (100 mg total) by mouth 2 (two) times daily. Patient taking differently: Take 100 mg by mouth every evening.  01/05/19  Yes Love, Ivan Anchors, PA-C  dorzolamide-timolol (COSOPT) 22.3-6.8 MG/ML ophthalmic solution Place 1 drop into both eyes 2 (two) times daily.   Yes [provider]  famotidine (PEPCID) 20 MG tablet Take 1 tablet (20 mg total) by mouth daily. 11/12/18  Yes Johnson, Clanford L, MD  ferrous sulfate 325 (65 FE) MG tablet Take 325 mg by mouth 2 (two) times daily with a meal.   Yes [provider]  finasteride (PROSCAR) 5 MG tablet Take 1 tablet (5 mg total) by mouth every evening. 09/22/18  Yes Angiulli, Lavon Paganini, PA-C  furosemide (LASIX) 40 MG tablet  Take 1 tablet (40 mg total) by mouth daily. 12/09/18  Yes Mercy Riding, MD  glipiZIDE (GLUCOTROL) 5 MG tablet Take 5 mg by mouth 2 (two) times daily.    Yes [provider]  levothyroxine (SYNTHROID) 50 MCG tablet Take 1 tablet (50 mcg total) by mouth daily before breakfast. 09/22/18  Yes Angiulli, Lavon Paganini, PA-C  Misc Natural Products (GLUCOSAMINE CHOND MSM FORMULA PO) Take 1 tablet by mouth 2 (two) times daily.   Yes [provider]  Omega-3 Fatty Acids (FISH OIL PO) Take 1 capsule by mouth daily.   Yes [provider]  polyethylene glycol (MIRALAX) 17 g packet Take 17 g by mouth daily as needed for moderate constipation. 12/09/18  Yes Mercy Riding, MD  potassium chloride (KLOR-CON) 10 MEQ tablet Take 1 tablet (10 mEq total) by mouth daily. 11/12/18  Yes Johnson, Clanford L, MD  pyridoxine (B-6) 100 MG tablet Take 100 mg by mouth daily.   Yes [provider]  senna-docusate (SENOKOT-S) 8.6-50 MG tablet Take 2 tablets by mouth at bedtime. 01/05/19  Yes Love, Ivan Anchors, PA-C  tamsulosin (FLOMAX) 0.4 MG CAPS capsule Take 1 capsule (0.4 mg total) by mouth 2 (two) times daily. 09/22/18  Yes Angiulli, Lavon Paganini, PA-C  traMADol (ULTRAM) 50 MG tablet Take 1 tablet (50 mg total) by mouth every 6 (six) hours as needed for severe pain. 01/05/19  Yes Love, Ivan Anchors, PA-C  vitamin C (ASCORBIC ACID) 500 MG tablet Take 500 mg by mouth daily.   Yes [provider]  vitamin E 400 UNIT capsule Take 400 Units by mouth daily.   Yes [provider]   No results found. - pertinent xrays, CT, MRI studies were reviewed and independently interpreted  Positive ROS: All other systems have been reviewed and were otherwise negative with the exception of those mentioned in the HPI and as above.  Physical Exam: General: Alert, no acute distress Psychiatric: Patient is competent for consent with normal mood and affect Lymphatic: No axillary or cervical  lymphadenopathy Cardiovascular: No pedal edema Respiratory: No cyanosis, no use of accessory musculature GI: No organomegaly, abdomen is soft and non-tender    Images:  @ENCIMAGES @  Labs:  Lab Results  Component Value Date   HGBA1C 5.9 (H) 11/09/2018   HGBA1C 7.0 (H) 05/29/2018   HGBA1C 8.1 (H) 07/25/2017   ESRSEDRATE 84 (H) 11/18/2018  CRP 5.1 (H) 11/18/2018   CRP <0.8 05/29/2018   REPTSTATUS 12/30/2018 FINAL 12/29/2018   CULT  12/29/2018    NO GROWTH Performed at [facility], [place] [address], [place], [place] [address]    LABORGA ESCHERICHIA COLI (A) 09/28/2018    Lab Results  Component Value Date   ALBUMIN 3.0 (L) 01/17/2019   ALBUMIN 2.8 (L) 12/26/2018   ALBUMIN 2.9 (L) 12/10/2018    Neurologic: Patient does not have protective sensation bilateral lower extremities.   MUSCULOSKELETAL:   Skin: Examination there is swelling and cellulitis of the transtibial amputation there is no wound dehiscence or gangrenous changes.  Patient has swelling up to the knee but the cellulitis does not extend up into the knee joint.  There is no effusion.  Assessment: Assessment: Acute cellulitis left transtibial amputation.  Plan: Plan: Agree with admission IV antibiotics I will reevaluate tomorrow if not showing significant improvement would plan for revision of the transtibial amputation with surgical irrigation and debridement.  This was discussed with the patient and family at bedside.  Thank you for the consult and the opportunity to see [name], [place] [phone] 7:39 AM

## 2019-01-18 NOTE — ED Notes (Signed)
RN noticed tourniquet on R upper arm under sleeve. RN removed and placed hot pads on. Pulses intact on wrist and brachial area. SNS intact and cap refill at baseline when comparing with other arm. PA, Sophia notified and assessed. No abnormalities noted except redness around arm where tourniquet was placed.

## 2019-01-18 NOTE — ED Provider Notes (Signed)
Rn requested that I see patient.  Patient is holding for admission.  She noted that he had a turnicot on his left arm above his IV site.  She believes tourniquet has been on since 8 AM.  Patient has had a blood draw at the site and may also have had antibiotics infused at the site.  Physical exam erythema at tourniquet site patient has a good brachial and good radial pulse.  Cap refill is normal He has full range of motion of extremities, RN applying hot pack.  Patient does not appear to have any tissue injuries.   Fransico Meadow, [place] 01/18/19 1311    Tegeler, Gwenyth Allegra, MD 01/18/19 [phone]

## 2019-01-18 NOTE — Telephone Encounter (Signed)
I spoke with pt's daughter and gave her information from Dr Debara Pickett.  She is asking about prescription for Eliquis.  I told her they should send prescription in when pt is discharged from hospital.  I told her she could contact us after discharge if prescription not sent in. Pt is scheduled to see Dr Debara Pickett on 1/6 but daughter feels pt will be admitted (he is currently in ED). Appointment canceled for 1/6.  Will send message to Dr [facility] nurse regarding follow up once pt discharged.

## 2019-01-19 ENCOUNTER — Other Ambulatory Visit: Payer: Self-pay | Admitting: *Deleted

## 2019-01-19 LAB — BASIC METABOLIC PANEL
Anion gap: 11 (ref 5–15)
BUN: 24 mg/dL — ABNORMAL HIGH (ref 8–23)
CO2: 25 mmol/L (ref 22–32)
Calcium: 8.8 mg/dL — ABNORMAL LOW (ref 8.9–10.3)
Chloride: 98 mmol/L (ref 98–111)
Creatinine, Ser: 1.12 mg/dL (ref 0.61–1.24)
GFR calc Af Amer: >60 mL/min (ref >60)
GFR calc non Af Amer: 58 mL/min — ABNORMAL LOW (ref >60)
Glucose, Bld: 124 mg/dL — ABNORMAL HIGH (ref 70–99)
Potassium: 3.8 mmol/L (ref 3.5–5.1)
Sodium: 134 mmol/L — ABNORMAL LOW (ref 135–145)

## 2019-01-19 LAB — GLUCOSE, CAPILLARY
Glucose-Capillary: 122 mg/dL — ABNORMAL HIGH (ref 70–99)
Glucose-Capillary: 145 mg/dL — ABNORMAL HIGH (ref 70–99)
Glucose-Capillary: 129 mg/dL — ABNORMAL HIGH (ref 70–99)
Glucose-Capillary: 139 mg/dL — ABNORMAL HIGH (ref 70–99)

## 2019-01-19 LAB — SURGICAL PCR SCREEN
MRSA, PCR: NEGATIVE
Staphylococcus aureus: NEGATIVE

## 2019-01-19 LAB — CBC
HCT: 27.2 % — ABNORMAL LOW (ref 39.0–52.0)
Hemoglobin: 9 g/dL — ABNORMAL LOW (ref 13.0–17.0)
MCH: 31.3 pg (ref 26.0–34.0)
MCHC: 33.1 g/dL (ref 30.0–36.0)
MCV: 94.4 fL (ref 80.0–100.0)
Platelets: 222 10*3/uL (ref 150–400)
RBC: 2.88 MIL/uL — ABNORMAL LOW (ref 4.22–5.81)
RDW: 17.2 % — ABNORMAL HIGH (ref 11.5–15.5)
WBC: 9.5 10*3/uL (ref 4.0–10.5)
nRBC: 0 % (ref 0.0–0.2)

## 2019-01-19 LAB — HEPARIN LEVEL (UNFRACTIONATED): Heparin Unfractionated: >2.2 IU/mL — ABNORMAL HIGH (ref 0.30–0.70)

## 2019-01-19 LAB — APTT
aPTT: 145 seconds — ABNORMAL HIGH (ref 24–36)
aPTT: 107 seconds — ABNORMAL HIGH (ref 24–36)

## 2019-01-19 MED ORDER — CHLORHEXIDINE GLUCONATE 4 % EX LIQD: 60.0000 mL | Freq: Once | CUTANEOUS | Status: DC

## 2019-01-19 MED ORDER — ADULT MULTIVITAMIN W/MINERALS CH
1.0000 | ORAL_TABLET | Freq: Every day | ORAL | Status: DC
  Administered 2019-01-19 – 2019-01-22 (×3): 1 via ORAL
  Filled 2019-01-19 (×3): qty 1

## 2019-01-19 MED ORDER — JUVEN PO PACK
1.0000 | PACK | Freq: Two times a day (BID) | ORAL | Status: DC
  Administered 2019-01-19 – 2019-01-22 (×6): 1 via ORAL
  Filled 2019-01-19 (×6): qty 1

## 2019-01-19 MED ORDER — HEPARIN (PORCINE) 25000 UT/250ML-% IV SOLN
1000.0000 [IU]/h | INTRAVENOUS | Status: DC
  Administered 2019-01-19: 1000 [IU]/h via INTRAVENOUS

## 2019-01-19 MED ORDER — HEPARIN (PORCINE) 25000 UT/250ML-% IV SOLN
700.0000 [IU]/h | INTRAVENOUS | Status: DC
  Administered 2019-01-19: 800 [IU]/h via INTRAVENOUS
  Filled 2019-01-19: qty 250

## 2019-01-19 NOTE — Progress Notes (Signed)
Initial Nutrition Assessment  DOCUMENTATION CODES:   Not applicable  INTERVENTION:   -MVI with minerals daily -1 packet Juven BID, each packet provides 95 calories, 2.5 grams of protein (collagen), and 9.8 grams of carbohydrate (3 grams sugar); also contains 7 grams of L-arginine and L-glutamine, 300 mg vitamin C, 15 mg vitamin E, 1.2 mcg vitamin B-12, 9.5 mg zinc, 200 mg calcium, and 1.5 g  Calcium Beta-hydroxy-Beta-methylbutyrate to support wound healing  NUTRITION DIAGNOSIS:   Increased nutrient needs related to wound healing as evidenced by estimated needs.  GOAL:   Patient will meet greater than or equal to 90% of their needs  MONITOR:   PO intake, Supplement acceptance, Labs, Weight trends, Skin, I & O's  REASON FOR ASSESSMENT:   Consult Wound healing  ASSESSMENT:   [name] is a 86 y.o. male with medical history significant for paroxysmal atrial fibrillation on Eliquis, type 2 diabetes mellitus, peripheral arterial disease, status post bilateral BKA with left BKA performed on 12/18/2018, now presenting to the emergency department with pain and redness at the left BKA stump with bloody and malodorous drainage from the incision site.  Patient's daughter is at the bedside and assists with the history.  There had been some scant amount of bleeding from the incision site since the surgery, but no pain until 2 days ago.  Over the past 2 days, there has been redness around the incision, severe pain when the site is touched, and the drainage has developed a foul odor.  Patient denies any fevers, chills, cough, shortness of breath, or chest pain.  Pt admitted with surgical site infection.   Reviewed I/O's: -2 L x 24 hours   UOP: 2.3 L x 24 hours  Pt receiving nursing care at time of visit.   Wt stable since amputation. He was s/p lt BKA in 12/2018.   No meal documentation available to assess. RD will add nutritional supplements to support wound healing.   Medications  reviewed and include colace and lasix.   Labs reviewed: Na: 134, CBGS: 122-148 (inpatient orders for glycemic control are 0-9 units insulin aspart TID with meals).   Diet Order:   Diet Order            Diet heart healthy/carb modified Room service appropriate? Yes; Fluid consistency: Thin  Diet effective now              EDUCATION NEEDS:   Education needs have been addressed  Skin:  Skin Assessment: Skin Integrity Issues: Skin Integrity Issues:: Incisions Incisions: s/p lt BKA  Last BM:  01/18/19  Height:   Ht Readings from Last 1 Encounters:  01/19/19 5\' 11"  (1.803 m)    Weight:   Wt Readings from Last 1 Encounters:  01/19/19 85.3 kg    Ideal Body Weight:  73.1 kg(adjusted for lt BKA)  BMI:  Body mass index is 26.22 kg/m.  Estimated Nutritional Needs:   Kcal:  1950-2150  Protein:  95-110 grams  Fluid:  > 1.9 L    Larhonda Dettloff A. Jimmye Norman, RD, LDN, [place] Registered Dietitian II Certified Diabetes Care and Education Specialist Pager: [phone] After hours Pager: [phone]

## 2019-01-19 NOTE — Progress Notes (Signed)
Awake alert answers questions . Comfortable. Dressing removed. Wound dehiscence with some surrounding erythema  Plan for revision surgery

## 2019-01-19 NOTE — Plan of Care (Signed)

## 2019-01-19 NOTE — Progress Notes (Addendum)
PROGRESS NOTE   [name]  HWK:[id number]    DOB: [birth year]    DOA: 01/17/2019  PCP: Redmond School, MD   I have briefly reviewed patients previous medical records in [facility].  Chief Complaint:   Chief Complaint  Patient presents with  . Leg Pain    LBKA bleeding and on ELIQUIS    Brief Narrative:   86 y.o. male with medical history significant for paroxysmal atrial fibrillation on Eliquis, type 2 diabetes mellitus, peripheral arterial disease, status post bilateral BKA with left BKA performed on 12/18/2018, now presenting to the emergency department with pain and redness at the left BKA stump with severe pain, bloody and malodorous drainage from the incision site.  Admitted for left BKA postop wound infection/cellulitis.  Treating with empiric antibiotics.  Orthopedics/Dr. Sharol Given consulted and if does not improve, plans surgical I&D on 1/6.  Eliquis held in case surgery needed and switched to IV heparin.  As per orthopedics, plan for I&D in OR on 1/6.   Assessment & Plan:  Active Problems:   Essential hypertension, benign   Hypothyroidism   Chronic diastolic CHF (congestive heart failure) (HCC)   PAF (paroxysmal atrial fibrillation) (HCC)   Type 2 diabetes mellitus with vascular disease (HCC)   Chronic kidney disease, stage 3a   Problem involving surgical incision   Anemia   Cellulitis   Left BKA postop wound infection/cellulitis.  S/p left BKA 12/18/2018.  No sepsis features on admission.  Follow blood cultures.  Empirically started on IV ceftriaxone, metronidazole and vancomycin per pharmacy per focused lower extremity wound order set.  Concern for MRSA but not Pseudomonas.  Dr. Farley Ly input 1/4 appreciated, recommends continuing IV antibiotics, will reassess 1/5 and if does not show significant improvement then plans revision of transtibial amputation with surgical I&D. Recommends continuing to hold Eliquis and okay with transitioning to IV heparin  which can be turned off couple of hours prior to surgery.  Antiplatelets on hold.  As per orthopedic follow-up 1/5, planning for revision surgery 1/6.  Blood cultures x2: Negative to date.  Paroxysmal A. Fib  CHA2DS2-VASc score: At least 5.  Held Eliquis.  IV heparin bridging pending possible surgery.  Appears to be in sinus rhythm.  Does not appear to be on any rate control medications at home.  Chronic diastolic CHF  TTE 9/45/8592: LVEF 55-60%.  Appears compensated.  Continue prior home dose of Lasix.  CKD stage IIIa  Baseline creatinine may be in the 1.1-1.2 range.  Close to baseline.  Continue Lasix and follow BMP.  Hypothyroidism  Continue Synthroid  PAD  Hold aspirin and Plavix for possible surgical intervention  Type II DM  A1c 5.9 in 10/2018.  Hold home glipizide.  SSI and adjust as needed.  Reasonable inpatient control.  Anemia of chronic disease  Stable.   DVT prophylaxis: IV heparin Code Status: DNR Family Communication: None at bedside. I discussed with patient's son, updated care and answered questions. Disposition: To be determined pending clinical improvement   Consultants:   Orthopedic/Dr. Sharol Given  Procedures:   None  Antimicrobials:   IV ceftriaxone, metronidazole and vancomycin   Subjective:  Mild pain at left BKA infected stump site on movement mostly.  No chest pain or dyspnea reported.  As per RN, no acute issues noted.  Objective:   Vitals:   01/19/19 0330 01/19/19 0818 01/19/19 1200 01/19/19 1422  BP: 130/69 (!) 137/57  (!) 124/58  Pulse: 60 (!) 57  (!) 59  Resp:  16 17  17   Temp: 98.7 F (37.1 C) 97.8 F (36.6 C)  98.2 F (36.8 C)  TempSrc: Oral Oral  Oral  SpO2: 97% 98%  96%  Weight:   85.3 kg   Height:   5\' 11"  (1.803 m)     General exam: Pleasant elderly male, moderately built and nourished lying comfortably propped up in bed without distress. Respiratory system: Clear to auscultation.  No increased work of  breathing. Cardiovascular system: S1 & S2 heard, RRR. No JVD, rubs, gallops or clicks. No pedal edema.  Systolic murmur 3/6 best heard at apex. Gastrointestinal system: Abdomen is nondistended, soft and nontender. No organomegaly or masses felt. Normal bowel sounds heard. Central nervous system: Alert and oriented x2. No focal neurological deficits. Extremities: Symmetric 5 x 5 power.  Right BKA with stump shrinker.  Left BKA as examined on 1/4: With diffuse erythema, couple of areas of dark discoloration, staples intact.  Some foul odor but no overt drainage.  Mild tenderness but no fluctuation or crepitus.  Please see pictures below taken on day of admission.  Today the stump was in dressing which was clean and dry.  Findings as per pictures and orthopedic note today. Skin: As above. Psychiatry: Judgement and insight appear somewhat impaired. Mood & affect appropriate.         Data Reviewed:   I have personally reviewed following labs and imaging studies   CBC: Recent Labs  Lab 01/17/19 1632 01/18/19 0623 01/19/19 0650  WBC 12.0* 12.1* 9.5  NEUTROABS 6.9  --   --   HGB 9.7* 9.9* 9.0*  HCT 31.1* 30.8* 27.2*  MCV 100.3* 96.3 94.4  PLT 253 263 353    Basic Metabolic Panel: Recent Labs  Lab 01/17/19 1632 01/18/19 0623 01/19/19 0650  NA 135 131* 134*  K 3.8 3.7 3.8  CL 100 95* 98  CO2 26 27 25   GLUCOSE 165* 133* 124*  BUN 25* 25* 24*  CREATININE 1.34* 1.29* 1.12  CALCIUM 8.5* 8.6* 8.8*    Liver Function Tests: Recent Labs  Lab 01/17/19 1632  AST 21  ALT 17  ALKPHOS 64  BILITOT 0.6  PROT 6.4*  ALBUMIN 3.0*    CBG: Recent Labs  Lab 01/19/19 0644 01/19/19 1139 01/19/19 1632  GLUCAP 122* 145* 129*    Microbiology Studies:   Recent Results (from the past 240 hour(s))  SARS CORONAVIRUS 2 (TAT 6-24 HRS) Nasopharyngeal Nasopharyngeal Swab     Status: None   Collection Time: 01/18/19  7:05 AM   Specimen: Nasopharyngeal Swab  Result Value Ref Range Status    SARS Coronavirus 2 NEGATIVE NEGATIVE Final    Comment: (NOTE) SARS-CoV-2 target nucleic acids are NOT DETECTED. The SARS-CoV-2 RNA is generally detectable in upper and lower respiratory specimens during the acute phase of infection. Negative results do not preclude SARS-CoV-2 infection, do not rule out co-infections with other pathogens, and should not be used as the sole basis for treatment or other patient management decisions. Negative results must be combined with clinical observations, patient history, and epidemiological information. The expected result is Negative. Fact Sheet for Patients: Sugar[employer].be Fact Sheet for Healthcare Providers: https://www.woods-mathews.com/ This test is not yet approved or cleared by the [place] FDA and  has been authorized for detection and/or diagnosis of SARS-CoV-2 by FDA under an Emergency Use Authorization (EUA). This EUA will remain  in effect (meaning this test can be used) for the duration of the COVID-19 declaration under Section 56 4(b)(1) of  the Act, 21 U.S.C. section 360bbb-3(b)(1), unless the authorization is terminated or revoked sooner. Performed at [facility], [place] [address] [place], [place] [address]   Blood culture (routine x 2)     Status: None (Preliminary result)   Collection Time: 01/18/19  7:07 AM   Specimen: BLOOD  Result Value Ref Range Status   Specimen Description BLOOD RIGHT ANTECUBITAL  Final   Special Requests   Final    BOTTLES DRAWN AEROBIC AND ANAEROBIC Blood Culture results may not be optimal due to an excessive volume of blood received in culture bottles   Culture   Final    NO GROWTH 1 DAY Performed at [facility], [place] [address], [place], [place] [address]    Report Status PENDING  Incomplete  Blood culture (routine x 2)     Status: None (Preliminary result)   Collection Time: 01/18/19  8:15 AM   Specimen: BLOOD LEFT HAND  Result Value Ref  Range Status   Specimen Description BLOOD LEFT HAND  Final   Special Requests   Final    BOTTLES DRAWN AEROBIC AND ANAEROBIC Blood Culture results may not be optimal due to an inadequate volume of blood received in culture bottles   Culture   Final    NO GROWTH 1 DAY Performed at [facility], [place] [address], [place], [place] [address]    Report Status PENDING  Incomplete  Surgical pcr screen     Status: None   Collection Time: 01/19/19  6:48 AM   Specimen: Nasal Mucosa; Nasal Swab  Result Value Ref Range Status   MRSA, PCR NEGATIVE NEGATIVE Final   Staphylococcus aureus NEGATIVE NEGATIVE Final    Comment: (NOTE) The Xpert SA Assay (FDA approved for NASAL specimens in patients 5 years of age and older), is one component of a comprehensive surveillance program. It is not intended to diagnose infection nor to guide or monitor treatment. Performed at [facility], [place] [address] [place], [place] [address]      Radiology Studies:  No results found.   Scheduled Meds:   . vitamin C  500 mg Oral Daily  . atorvastatin  40 mg Oral Daily  . chlorhexidine  60 mL Topical Once  . Chlorhexidine Gluconate Cloth  6 each Topical Daily  . docusate sodium  100 mg Oral QPM  . dorzolamide-timolol  1 drop Both Eyes BID  . famotidine  20 mg Oral Daily  . ferrous sulfate  325 mg Oral BID WC  . finasteride  5 mg Oral QPM  . furosemide  40 mg Oral Daily  . insulin aspart  0-9 Units Subcutaneous TID WC  . levothyroxine  50 mcg Oral Q0600  . multivitamin with minerals  1 tablet Oral Daily  . nutrition supplement (JUVEN)  1 packet Oral BID BM  . potassium chloride  10 mEq Oral Daily  . pyridoxine  100 mg Oral Daily  . senna-docusate  2 tablet Oral QHS  . sodium chloride flush  3 mL Intravenous Once  . sodium chloride flush  3 mL Intravenous Q12H  . tamsulosin  0.4 mg Oral BID  . vitamin E  400 Units Oral Daily    Continuous Infusions:   . sodium chloride    . cefTRIAXone  (ROCEPHIN)  IV 2 g (01/19/19 0823)  . heparin 800 Units/hr (01/19/19 1400)  . metronidazole 500 mg (01/19/19 1524)  . vancomycin 750 mg (01/19/19 0919)  LOS: 1 day     Vernell Leep, MD, [place], [facility]. [facility]    To contact the attending provider between 7A-7P or the covering provider during after hours 7P-7A, please log into the web site [email or web address] and access using universal [place] password for that web site. If you do not have the password, please call the hospital operator.  01/19/2019, 5:39 PM

## 2019-01-19 NOTE — Plan of Care (Signed)
  Problem: Education: Goal: Knowledge of General Education information will improve Description: Including pain rating scale, medication(s)/side effects and non-pharmacologic comfort measures Outcome: Progressing   Problem: Education: Goal: Knowledge of General Education information will improve Description: Including pain rating scale, medication(s)/side effects and non-pharmacologic comfort measures Outcome: Progressing   Problem: Clinical Measurements: Goal: Ability to maintain clinical measurements within normal limits will improve Outcome: Progressing   Problem: Activity: Goal: Risk for activity intolerance will decrease Outcome: Progressing   Problem: Nutrition: Goal: Adequate nutrition will be maintained Outcome: Progressing   Problem: Coping: Goal: Level of anxiety will decrease Outcome: Progressing   Problem: Pain Managment: Goal: General experience of comfort will improve Outcome: Progressing   Problem: Safety: Goal: Ability to remain free from injury will improve Outcome: Progressing   Problem: Skin Integrity: Goal: Risk for impaired skin integrity will decrease Outcome: Progressing

## 2019-01-19 NOTE — Consult Note (Signed)
   [facility] Inpatient Consult   01/19/2019  [name] 30-May-1927 [id number]   Patient is recently active with [facility] for chronic disease management services.  Patient has been engaged by a [place], who is aware of the patient's admission.   Patient has an extreme high risk score for unplanned readmissions of 49% with less than 30 days readmissions and 8 admissions in the past 6 months at [facility].  Patient is in  [facility].  Chart review reveals patient has admitted for pain, bleeding and  Draining from his left Below the knee amputated [BKA] stump. Plan:  Follow for progress and disposition needs and follow up with Inpatient Transition Of Care [TOC] team member to make aware that [facility] following.   Of note, [facility] Care Management services does not replace or interfere with any services that are needed or arranged by inpatient [facility] care management team.  For additional questions or referrals please contact:   Natividad Brood, RN BSN [facility] Liaison  [phone] business mobile phone Toll free office [phone]  Fax number: [phone] [email or web address]

## 2019-01-19 NOTE — Progress Notes (Signed)
ANTICOAGULATION CONSULT NOTE -  Pharmacy Consult for Heparin Indication: atrial fibrillation  Allergies  Allergen Reactions  . Iodinated Diagnostic Agents     Other reaction(s): Fever  . Dye Fdc Red [Red Dye] Hives    Tolerated red Docusate, Niferex  . Iodine-131 Other (See Comments)    Breakout, fever  . Iohexol      Code: RASH, Desc: PT STATES HOT ALL OVER HAD TO HAVE ICE PACKS ALL OVER HIM AND DIFF BREATHING, PT REFUSED IV DYE     Patient Measurements:   Heparin Dosing Weight:85.3  Vital Signs: Temp: 98 F (36.7 C) (01/04 1956) Temp Source: Oral (01/04 1956) BP: 119/62 (01/04 1956) Pulse Rate: 63 (01/04 1956)  Labs: Recent Labs    01/17/19 1632 01/18/19 0623 01/18/19 2301  HGB 9.7* 9.9*  --   HCT 31.1* 30.8*  --   PLT 253 263  --   APTT  --   --  156*  CREATININE 1.34* 1.29*  --     Estimated Creatinine Clearance: 41.3 mL/min (A) (by C-G formula based on SCr of 1.29 mg/dL (H)).   Medical History: Past Medical History:  Diagnosis Date  . Arthritis   . Borderline diabetic   . Diabetes mellitus without complication ([place])   . Glaucoma   . Hyperlipidemia   . Hypertension   . PVD (peripheral vascular disease) ([place])   . Sleep apnea    Cpap ordered but doesnt use     Assessment: 86 y.o. male presented with possible wound infection/cellulitis with history of Afib. Patient was on apixaban 5 mg BID prior to admission, last dose was taken on the morning of 01/17/2019. Hold apixaban, pending surgeon's assessment. Pharmacy consulted for heparin.  Due to recent apixaban dose, heparin level will be falsely elevated. Monitor aPTT and heparin until both correlate.   1/5 AM update: aPTT is supra-therapeutic, no issues per RN  Goal of Therapy:  Heparin level goal 0.3 to 0.7 units/mL APTT 66-102 seconds  Monitor platelets by anticoagulation protocol: Yes   Plan:  -Hold heparin x 30 mins -Re-start heparin drip at 1000 units/hr at 0100 -Re-check aPTT/heparin level  at [place], PharmD, [place] Pharmacist Phone: [phone]

## 2019-01-19 NOTE — Progress Notes (Signed)
ANTICOAGULATION CONSULT NOTE - Follow Up Consult  Pharmacy Consult for Heparin (Eliquis on hold) Indication: atrial fibrillation  Allergies  Allergen Reactions  . Iodinated Diagnostic Agents     Other reaction(s): Fever  . Dye Fdc Red [Red Dye] Hives    Tolerated red Docusate, Niferex  . Iodine-131 Other (See Comments)    Breakout, fever  . Iohexol      Code: RASH, Desc: PT STATES HOT ALL OVER HAD TO HAVE ICE PACKS ALL OVER HIM AND DIFF BREATHING, PT REFUSED IV DYE     Patient Measurements: Height: 5\' 11"  (180.3 cm) Weight: 188 lb (85.3 kg) IBW/kg (Calculated) : 75.3 Heparin Dosing Weight: 85.3 kg  Vital Signs: Temp: 97.8 F (36.6 C) (01/05 0818) Temp Source: Oral (01/05 0818) BP: 137/57 (01/05 0818) Pulse Rate: 57 (01/05 0818)  Labs: Recent Labs    01/17/19 1632 01/18/19 4599 01/18/19 2301 01/19/19 0650 01/19/19 1023  HGB 9.7* 9.9*  --  9.0*  --   HCT 31.1* 30.8*  --  27.2*  --   PLT 253 263  --  222  --   APTT  --   --  156*  --  145*  HEPARINUNFRC  --   --   --   --  >2.20*  CREATININE 1.34* 1.29*  --  1.12  --     Estimated Creatinine Clearance: 47.6 mL/min (by C-G formula based on SCr of 1.12 mg/dL).  Assessment:  86 y.o. male presented with possible wound infection/cellulitis with history of Afib. Patient was on apixaban 5 mg BID prior to admission, last dose was taken on the morning of 01/17/2019. Holding apixaban for surgery on 1/6.  Pharmacy consulted for heparin.  Due to recent apixaban doses, heparin level will be falsely elevated. Will plan to monitor aPTT and heparin levels until both correlate.     APTT remains supratherapeutic (145 seconds) on 1000 units/hr.   Heparin level falsely elevated (>2.20).  No bleeding reported.    Revision of L BKA scheduled for 11:30am on 01/19/18.   Goal of Therapy:  Heparin level 0.3-0.7 units/ml aPTT 66-102 seconds Monitor platelets by anticoagulation protocol: Yes   Plan:   Hold heparin for ~45 minutes.  Resume heparin at 1:30pm at 800 units/hr  aPTT ~8 hrs after heparin resumed.  Daily heparin level, aPTT and CBC.  Eliquis on hold; also Plavix and Aspirin 81 mg.  Will follow up for timing of holding and resuming IV heparin pre- and post-op on 11/6.  Arty Baumgartner, [place] Phone: [phone] 01/19/2019,12:48 PM

## 2019-01-19 NOTE — Progress Notes (Signed)
ANTICOAGULATION CONSULT NOTE - Follow Up Consult  Pharmacy Consult for Heparin (Eliquis on hold) Indication: atrial fibrillation  Allergies  Allergen Reactions  . Iodinated Diagnostic Agents     Other reaction(s): Fever  . Dye Fdc Red [Red Dye] Hives    Tolerated red Docusate, Niferex  . Iodine-131 Other (See Comments)    Breakout, fever  . Iohexol      Code: RASH, Desc: PT STATES HOT ALL OVER HAD TO HAVE ICE PACKS ALL OVER HIM AND DIFF BREATHING, PT REFUSED IV DYE     Patient Measurements: Height: 5\' 11"  (180.3 cm) Weight: 188 lb (85.3 kg) IBW/kg (Calculated) : 75.3 Heparin Dosing Weight: 85.3 kg  Vital Signs: Temp: 98.2 F (36.8 C) (01/05 1937) Temp Source: Oral (01/05 1937) BP: 144/58 (01/05 1937) Pulse Rate: 62 (01/05 1937)  Labs: Recent Labs    01/17/19 1632 01/18/19 8768 01/18/19 2301 01/19/19 0650 01/19/19 1023 01/19/19 2042  HGB 9.7* 9.9*  --  9.0*  --   --   HCT 31.1* 30.8*  --  27.2*  --   --   PLT 253 263  --  222  --   --   APTT  --   --  156*  --  145* 107*  HEPARINUNFRC  --   --   --   --  >2.20*  --   CREATININE 1.34* 1.29*  --  1.12  --   --     Estimated Creatinine Clearance: 47.6 mL/min (by C-G formula based on SCr of 1.12 mg/dL).  Assessment: 86 y.o. male presented with possible wound infection/cellulitis with history of a fib. Patient was on apixaban 5 mg BID prior to admission, last dose was taken on the morning of 01/17/2019; apixaban on hold for surgery on 1/6.  Pharmacy consulted for heparin dosing/monitoring.  Due to recent apixaban doses, heparin level will be falsely elevated; will plan to monitor anticoagulation using aPTT until aPTT and heparin levels correlate.   aPTT ~7 hrs after heparin infusion was held for 45 mins, then restarted at 800 units/hr, is 107 sec, which is slightly above the goal range of 66-102 sec. H/H 9.0/27.2, platelets 222. Per RN, no issues with IV or bleeding observed.  Revision of L BKA scheduled for 11:30 AM on  01/19/18.  Goal of Therapy:  Heparin level 0.3-0.7 units/ml aPTT 66-102 seconds Monitor platelets by anticoagulation protocol: Yes   Plan:  Reduce heparin infusion to 700 units/hr Check aPTT, heparin level in 8 hrs Monitor daily aPTT, heparin level, CBC Monitor for signs/symptoms of bleeding F/U timing of holding and resuming IV heparin pre- and post-op on 11/6  Gillermina Hu, PharmD, BCPS, [facility] Clinical Pharmacist 01/19/2019,9:35 PM

## 2019-01-20 ENCOUNTER — Inpatient Hospital Stay (HOSPITAL_COMMUNITY): Payer: Medicare Other | Admitting: Anesthesiology

## 2019-01-20 ENCOUNTER — Encounter (HOSPITAL_COMMUNITY): Payer: Self-pay | Admitting: Family Medicine

## 2019-01-20 ENCOUNTER — Ambulatory Visit: Payer: Medicare Other | Admitting: Internal Medicine

## 2019-01-20 ENCOUNTER — Encounter (HOSPITAL_COMMUNITY)
Admission: EM | Disposition: A | Discharge status: Home Health | Payer: Self-pay | Source: Home / Self Care | Attending: Internal Medicine

## 2019-01-20 DIAGNOSIS — T8149XA Infection following a procedure, other surgical site, initial encounter: Secondary | ICD-10-CM

## 2019-01-20 DIAGNOSIS — T8781 Dehiscence of amputation stump: Secondary | ICD-10-CM

## 2019-01-20 DIAGNOSIS — N1831 Chronic kidney disease, stage 3a: Secondary | ICD-10-CM

## 2019-01-20 DIAGNOSIS — E039 Hypothyroidism, unspecified: Secondary | ICD-10-CM

## 2019-01-20 DIAGNOSIS — I5032 Chronic diastolic (congestive) heart failure: Secondary | ICD-10-CM

## 2019-01-20 HISTORY — PX: STUMP REVISION: SHX6102

## 2019-01-20 LAB — BASIC METABOLIC PANEL
Anion gap: 10 (ref 5–15)
BUN: 30 mg/dL — ABNORMAL HIGH (ref 8–23)
CO2: 27 mmol/L (ref 22–32)
Calcium: 8.7 mg/dL — ABNORMAL LOW (ref 8.9–10.3)
Chloride: 96 mmol/L — ABNORMAL LOW (ref 98–111)
Creatinine, Ser: 1.26 mg/dL — ABNORMAL HIGH (ref 0.61–1.24)
GFR calc Af Amer: 58 mL/min — ABNORMAL LOW (ref >60)
GFR calc non Af Amer: 50 mL/min — ABNORMAL LOW (ref >60)
Glucose, Bld: 137 mg/dL — ABNORMAL HIGH (ref 70–99)
Potassium: 3.6 mmol/L (ref 3.5–5.1)
Sodium: 133 mmol/L — ABNORMAL LOW (ref 135–145)

## 2019-01-20 LAB — GLUCOSE, CAPILLARY
Glucose-Capillary: 103 mg/dL — ABNORMAL HIGH (ref 70–99)
Glucose-Capillary: 113 mg/dL — ABNORMAL HIGH (ref 70–99)
Glucose-Capillary: 121 mg/dL — ABNORMAL HIGH (ref 70–99)
Glucose-Capillary: 124 mg/dL — ABNORMAL HIGH (ref 70–99)
Glucose-Capillary: 150 mg/dL — ABNORMAL HIGH (ref 70–99)
Glucose-Capillary: 160 mg/dL — ABNORMAL HIGH (ref 70–99)
Glucose-Capillary: 175 mg/dL — ABNORMAL HIGH (ref 70–99)

## 2019-01-20 LAB — CBC
HCT: 26.5 % — ABNORMAL LOW (ref 39.0–52.0)
Hemoglobin: 8.6 g/dL — ABNORMAL LOW (ref 13.0–17.0)
MCH: 30.8 pg (ref 26.0–34.0)
MCHC: 32.5 g/dL (ref 30.0–36.0)
MCV: 95 fL (ref 80.0–100.0)
Platelets: 240 10*3/uL (ref 150–400)
RBC: 2.79 MIL/uL — ABNORMAL LOW (ref 4.22–5.81)
RDW: 17.2 % — ABNORMAL HIGH (ref 11.5–15.5)
WBC: 9 10*3/uL (ref 4.0–10.5)
nRBC: 0 % (ref 0.0–0.2)

## 2019-01-20 LAB — HEMOGLOBIN A1C
Hgb A1c MFr Bld: 5.6 % (ref 4.8–5.6)
Mean Plasma Glucose: 114 mg/dL

## 2019-01-20 LAB — APTT: aPTT: 97 seconds — ABNORMAL HIGH (ref 24–36)

## 2019-01-20 LAB — HEPARIN LEVEL (UNFRACTIONATED): Heparin Unfractionated: 1.84 IU/mL — ABNORMAL HIGH (ref 0.30–0.70)

## 2019-01-20 SURGERY — REVISION, AMPUTATION SITE
Anesthesia: General | Site: Leg Lower | Laterality: Left

## 2019-01-20 MED ORDER — DEXAMETHASONE SODIUM PHOSPHATE 10 MG/ML IJ SOLN
INTRAMUSCULAR | Status: AC
  Filled 2019-01-20: qty 1

## 2019-01-20 MED ORDER — FENTANYL CITRATE (PF) 250 MCG/5ML IJ SOLN
INTRAMUSCULAR | Status: DC | PRN
  Administered 2019-01-20: 50 ug via INTRAVENOUS

## 2019-01-20 MED ORDER — MEPERIDINE HCL 25 MG/ML IJ SOLN: 6.2500 mg | INTRAMUSCULAR | Status: DC | PRN

## 2019-01-20 MED ORDER — FENTANYL CITRATE (PF) 100 MCG/2ML IJ SOLN
INTRAMUSCULAR | Status: AC
  Filled 2019-01-20: qty 2

## 2019-01-20 MED ORDER — ACETAMINOPHEN 325 MG PO TABS: 325.0000 mg | ORAL_TABLET | ORAL | Status: DC | PRN

## 2019-01-20 MED ORDER — ONDANSETRON HCL 4 MG/2ML IJ SOLN
INTRAMUSCULAR | Status: AC
  Filled 2019-01-20: qty 2

## 2019-01-20 MED ORDER — OXYCODONE HCL 5 MG PO TABS: 5.0000 mg | ORAL_TABLET | Freq: Once | ORAL | Status: DC | PRN

## 2019-01-20 MED ORDER — PROPOFOL 10 MG/ML IV BOLUS
INTRAVENOUS | Status: DC | PRN
  Administered 2019-01-20: 80 mg via INTRAVENOUS

## 2019-01-20 MED ORDER — MIDAZOLAM HCL 2 MG/2ML IJ SOLN
INTRAMUSCULAR | Status: AC
  Filled 2019-01-20: qty 2

## 2019-01-20 MED ORDER — ONDANSETRON HCL 4 MG/2ML IJ SOLN: 4.0000 mg | Freq: Once | INTRAMUSCULAR | Status: DC | PRN

## 2019-01-20 MED ORDER — MORPHINE SULFATE (PF) 2 MG/ML IV SOLN: 0.5000 mg | INTRAVENOUS | Status: DC | PRN

## 2019-01-20 MED ORDER — OXYCODONE HCL 5 MG/5ML PO SOLN: 5.0000 mg | Freq: Once | ORAL | Status: DC | PRN

## 2019-01-20 MED ORDER — FENTANYL CITRATE (PF) 100 MCG/2ML IJ SOLN
25.0000 ug | INTRAMUSCULAR | Status: DC | PRN
  Administered 2019-01-20 (×2): 50 ug via INTRAVENOUS

## 2019-01-20 MED ORDER — FENTANYL CITRATE (PF) 250 MCG/5ML IJ SOLN
INTRAMUSCULAR | Status: AC
  Filled 2019-01-20: qty 5

## 2019-01-20 MED ORDER — 0.9 % SODIUM CHLORIDE (POUR BTL) OPTIME
TOPICAL | Status: DC | PRN
  Administered 2019-01-20: 1000 mL

## 2019-01-20 MED ORDER — METOCLOPRAMIDE HCL 5 MG/ML IJ SOLN: 5.0000 mg | Freq: Three times a day (TID) | INTRAMUSCULAR | Status: DC | PRN

## 2019-01-20 MED ORDER — LIDOCAINE 2% (20 MG/ML) 5 ML SYRINGE
INTRAMUSCULAR | Status: DC | PRN
  Administered 2019-01-20: 60 mg via INTRAVENOUS

## 2019-01-20 MED ORDER — OXYCODONE-ACETAMINOPHEN 5-325 MG PO TABS
1.0000 | ORAL_TABLET | ORAL | Status: DC | PRN
  Administered 2019-01-20 – 2019-01-22 (×4): 1 via ORAL
  Filled 2019-01-20 (×4): qty 1

## 2019-01-20 MED ORDER — HYDROCODONE-ACETAMINOPHEN 5-325 MG PO TABS
1.0000 | ORAL_TABLET | ORAL | Status: DC | PRN
  Administered 2019-01-20: 2 via ORAL
  Filled 2019-01-20: qty 2

## 2019-01-20 MED ORDER — LACTATED RINGERS IV SOLN: INTRAVENOUS | Status: DC

## 2019-01-20 MED ORDER — METOCLOPRAMIDE HCL 5 MG PO TABS: 5.0000 mg | ORAL_TABLET | Freq: Three times a day (TID) | ORAL | Status: DC | PRN

## 2019-01-20 MED ORDER — ACETAMINOPHEN 160 MG/5ML PO SOLN: 325.0000 mg | ORAL | Status: DC | PRN

## 2019-01-20 MED ORDER — EPHEDRINE 5 MG/ML INJ
INTRAVENOUS | Status: AC
  Filled 2019-01-20: qty 10

## 2019-01-20 MED ORDER — EPHEDRINE SULFATE-NACL 50-0.9 MG/10ML-% IV SOSY
PREFILLED_SYRINGE | INTRAVENOUS | Status: DC | PRN
  Administered 2019-01-20: 10 mg via INTRAVENOUS

## 2019-01-20 MED ORDER — ONDANSETRON HCL 4 MG/2ML IJ SOLN
INTRAMUSCULAR | Status: DC | PRN
  Administered 2019-01-20: 4 mg via INTRAVENOUS

## 2019-01-20 MED ORDER — PROPOFOL 10 MG/ML IV BOLUS
INTRAVENOUS | Status: AC
  Filled 2019-01-20: qty 20

## 2019-01-20 MED ORDER — HYDROMORPHONE HCL 1 MG/ML IJ SOLN
0.5000 mg | INTRAMUSCULAR | Status: DC | PRN
  Administered 2019-01-20: 0.5 mg via INTRAVENOUS
  Filled 2019-01-20: qty 1

## 2019-01-20 SURGICAL SUPPLY — 32 items
BLADE SAW RECIP 87.9 MT (BLADE) IMPLANT
BLADE SURG 21 STRL SS (BLADE) ×3 IMPLANT
BNDG COHESIVE 6X5 TAN STRL LF (GAUZE/BANDAGES/DRESSINGS) ×2 IMPLANT
CANISTER WOUND CARE 500ML ATS (WOUND CARE) ×3 IMPLANT
COVER SURGICAL LIGHT HANDLE (MISCELLANEOUS) ×3 IMPLANT
COVER WAND RF STERILE (DRAPES) ×3 IMPLANT
DRAPE EXTREMITY T 121X128X90 (DISPOSABLE) ×3 IMPLANT
DRAPE HALF SHEET 40X57 (DRAPES) ×3 IMPLANT
DRAPE INCISE IOBAN 66X45 STRL (DRAPES) ×3 IMPLANT
DRAPE U-SHAPE 47X51 STRL (DRAPES) ×6 IMPLANT
DRESSING PREVENA PLUS CUSTOM (GAUZE/BANDAGES/DRESSINGS) ×1 IMPLANT
DRSG PREVENA PLUS CUSTOM (GAUZE/BANDAGES/DRESSINGS) ×3
DURAPREP 26ML APPLICATOR (WOUND CARE) ×3 IMPLANT
ELECT REM PT RETURN 9FT ADLT (ELECTROSURGICAL) ×3
ELECTRODE REM PT RTRN 9FT ADLT (ELECTROSURGICAL) ×1 IMPLANT
GLOVE BIOGEL PI IND STRL 9 (GLOVE) ×1 IMPLANT
GLOVE BIOGEL PI INDICATOR 9 (GLOVE) ×2
GLOVE SURG ORTHO 9.0 STRL STRW (GLOVE) ×3 IMPLANT
GOWN STRL REUS W/ TWL XL LVL3 (GOWN DISPOSABLE) ×2 IMPLANT
GOWN STRL REUS W/TWL XL LVL3 (GOWN DISPOSABLE) ×4
KIT BASIN OR (CUSTOM PROCEDURE TRAY) ×3 IMPLANT
KIT TURNOVER KIT B (KITS) ×3 IMPLANT
MANIFOLD NEPTUNE II (INSTRUMENTS) ×3 IMPLANT
NS IRRIG 1000ML POUR BTL (IV SOLUTION) ×3 IMPLANT
PACK GENERAL/GYN (CUSTOM PROCEDURE TRAY) ×3 IMPLANT
PAD ARMBOARD 7.5X6 YLW CONV (MISCELLANEOUS) ×3 IMPLANT
PREVENA RESTOR ARTHOFORM 46X30 (CANNISTER) ×3 IMPLANT
STAPLER VISISTAT 35W (STAPLE) IMPLANT
SUT ETHILON 2 0 PSLX (SUTURE) ×6 IMPLANT
SUT SILK 2 0 (SUTURE)
SUT SILK 2-0 18XBRD TIE 12 (SUTURE) IMPLANT
TOWEL GREEN STERILE (TOWEL DISPOSABLE) ×3 IMPLANT

## 2019-01-20 NOTE — Transfer of Care (Signed)
Immediate Anesthesia Transfer of Care Note  Patient: [name]  Procedure(s) Performed: REVISION LEFT BELOW KNEE AMPUTATION (Left Leg Lower)  Patient Location: PACU  Anesthesia Type:General  Level of Consciousness: drowsy  Airway & Oxygen Therapy: Patient Spontanous Breathing  Post-op Assessment: Report given to RN  Post vital signs: Reviewed and stable  Last Vitals:  Vitals Value Taken Time  BP    Temp    Pulse 68 01/20/19 1239  Resp    SpO2 97 % 01/20/19 1239  Vitals shown include unvalidated device data.  Last Pain:  Vitals:   01/20/19 0827  TempSrc: Oral  PainSc:          Complications: No apparent anesthesia complications

## 2019-01-20 NOTE — Anesthesia Postprocedure Evaluation (Signed)
Anesthesia Post Note  Patient: [name]  Procedure(s) Performed: REVISION LEFT BELOW KNEE AMPUTATION (Left Leg Lower)     Patient location during evaluation: PACU Anesthesia Type: General Level of consciousness: awake and alert Pain management: pain level controlled Vital Signs Assessment: post-procedure vital signs reviewed and stable Respiratory status: spontaneous breathing, nonlabored ventilation, respiratory function stable and patient connected to nasal cannula oxygen Cardiovascular status: blood pressure returned to baseline and stable Postop Assessment: no apparent nausea or vomiting Anesthetic complications: no    Last Vitals:  Vitals:   01/20/19 1255 01/20/19 1316  BP: (!) 151/64 (!) 151/73  Pulse: 70 70  Resp: (!) 21 16  Temp:  36.5 C  SpO2: 97% 97%    Last Pain:  Vitals:   01/20/19 1337  TempSrc:   PainSc: 10-Worst pain ever                 Harlee Eckroth

## 2019-01-20 NOTE — Progress Notes (Signed)
PROGRESS NOTE    [name]   WUJ:[id number]  DOB: 1927-04-28  DOA: 01/17/2019 PCP: Redmond School, MD   Brief Narrative:  [name] 86 y.o.malewith medical history significant forparoxysmal atrial fibrillation on Eliquis, type 2 diabetes mellitus, peripheral arterial disease, status post bilateral BKA with left BKA performed on 12/18/2018, now presenting to the emergency department with pain and redness at the left BKA stump with severe pain, bloody and malodorous drainage from the incision site.  Admitted for left BKA postop wound infection/cellulitis.  Treating with empiric antibiotics.  Orthopedics/Dr. Sharol Given consulted and if does not improve, plans surgical I&D on 1/6.  Eliquis held in case surgery needed and switched to IV heparin.   Subjective: No complaints.  Awaiting surgery.  Assessment & Plan:   Principal Problem:   Postoperative wound infection -Patient is status post left BKA on 12/4 with subsequent wound infection - He was started on ceftriaxone, IV Flagyl and vancomycin-antibiotics are to continue 24 hours postop- ion BKA today  Active Problems: Paroxysmal A. fib -Eliquis on hold due to surgery-Heparin infusion resumed  Peripheral artery disease -On aspirin and Plavix at home- these remain on hold    Essential hypertension, benign-chronic diastolic heart failure -Last 2D echo on 08/27/2018 revealed an EF of 55 to 60% -Lasix being continued however, the patient appears to be dehydrated therefore I will hold Lasix for now  CKD stage IIIa -Creatinine 1.2 and is near baseline    Hypothyroidism -Continue Synthroid    Type 2 diabetes mellitus with vascular disease ([place]) -Currently on a low-dose sliding scale with meals -Hemoglobin A1c was 5.9 on 10/20 -Glipizide is on hold  Anemia of chronic disease -Continue to follow hemoglobin   Time spent in minutes: 30 DVT prophylaxis: Heparin infusion Code Status: Do not resuscitate Family Communication:   Disposition Plan: SNF with wound VAC Consultants:   Orthopedic surgery Procedures:   Revision left BKA Antimicrobials:  Anti-infectives (From admission, onward)   Start     Dose/Rate Route Frequency Ordered Stop   01/18/19 0800  vancomycin (VANCOREADY) IVPB 750 mg/150 mL     750 mg 150 mL/hr over 60 Minutes Intravenous Every 24 hours 01/18/19 0752     01/18/19 0745  cefTRIAXone (ROCEPHIN) 2 g in sodium chloride 0.9 % 100 mL IVPB     2 g 200 mL/hr over 30 Minutes Intravenous Every 24 hours 01/18/19 0736     01/18/19 0745  metroNIDAZOLE (FLAGYL) IVPB 500 mg     500 mg 100 mL/hr over 60 Minutes Intravenous Every 8 hours 01/18/19 0736         Objective: Vitals:   01/20/19 1240 01/20/19 1255 01/20/19 1316 01/20/19 1719  BP: (!) 143/94 (!) 151/64 (!) 151/73 (!) 130/54  Pulse: 68 70 70 64  Resp: 14 (!) 21 16 18   Temp: 98.1 F (36.7 C)  97.7 F (36.5 C) 98.3 F (36.8 C)  TempSrc:   Oral Oral  SpO2: 97% 97% 97% 97%  Weight:      Height:        Intake/Output Summary (Last 24 hours) at 01/20/2019 1735 Last data filed at 01/20/2019 1700 Gross per 24 hour  Intake 988.83 ml  Output 4530 ml  Net -3541.17 ml   Filed Weights   01/19/19 1200 01/20/19 1054  Weight: 85.3 kg 85.3 kg    Examination: General exam: Appears comfortable  HEENT: PERRLA, oral mucosa moist, no sclera icterus or thrush Respiratory system: Clear to auscultation. Respiratory effort normal. Cardiovascular  system: S1 & S2 heard, RRR.  2/6 murmur noted at apex Gastrointestinal system: Abdomen soft, non-tender, nondistended. Normal bowel sounds. Central nervous system: Alert and oriented. No focal neurological deficits. Extremities: No cyanosis, clubbing or edema-bilateral BKA noted-dressing not open today Skin: No rashes or ulcers Psychiatry:  Mood & affect appropriate.     Data Reviewed: I have personally reviewed following labs and imaging studies  CBC: Recent Labs  Lab 01/17/19 1632 01/18/19 0623  01/19/19 0650 01/20/19 0335  WBC 12.0* 12.1* 9.5 9.0  NEUTROABS 6.9  --   --   --   HGB 9.7* 9.9* 9.0* 8.6*  HCT 31.1* 30.8* 27.2* 26.5*  MCV 100.3* 96.3 94.4 95.0  PLT 253 263 222 431   Basic Metabolic Panel: Recent Labs  Lab 01/17/19 1632 01/18/19 0623 01/19/19 0650 01/20/19 0335  NA 135 131* 134* 133*  K 3.8 3.7 3.8 3.6  CL 100 95* 98 96*  CO2 26 27 25 27   GLUCOSE 165* 133* 124* 137*  BUN 25* 25* 24* 30*  CREATININE 1.34* 1.29* 1.12 1.26*  CALCIUM 8.5* 8.6* 8.8* 8.7*   GFR: Estimated Creatinine Clearance: 42.3 mL/min (A) (by C-G formula based on SCr of 1.26 mg/dL (H)). Liver Function Tests: Recent Labs  Lab 01/17/19 1632  AST 21  ALT 17  ALKPHOS 64  BILITOT 0.6  PROT 6.4*  ALBUMIN 3.0*   No results for input(s): LIPASE, AMYLASE in the last 168 hours. No results for input(s): AMMONIA in the last 168 hours. Coagulation Profile: No results for input(s): INR, PROTIME in the last 168 hours. Cardiac Enzymes: No results for input(s): CKTOTAL, CKMB, CKMBINDEX, TROPONINI in the last 168 hours. BNP (last 3 results) No results for input(s): PROBNP in the last 8760 hours. HbA1C: Recent Labs    01/19/19 0650  HGBA1C 5.6   CBG: Recent Labs  Lab 01/20/19 0425 01/20/19 0641 01/20/19 1044 01/20/19 1241 01/20/19 1700  GLUCAP 113* 103* 121* 124* 160*   Lipid Profile: No results for input(s): CHOL, HDL, LDLCALC, TRIG, CHOLHDL, LDLDIRECT in the last 72 hours. Thyroid Function Tests: No results for input(s): TSH, T4TOTAL, FREET4, T3FREE, THYROIDAB in the last 72 hours. Anemia Panel: No results for input(s): VITAMINB12, FOLATE, FERRITIN, TIBC, IRON, RETICCTPCT in the last 72 hours. Urine analysis:    Component Value Date/Time   COLORURINE YELLOW 01/02/2019 1427   APPEARANCEUR HAZY (A) 01/02/2019 1427   LABSPEC 1.011 01/02/2019 1427   PHURINE 6.0 01/02/2019 1427   GLUCOSEU NEGATIVE 01/02/2019 1427   HGBUR MODERATE (A) 01/02/2019 1427   BILIRUBINUR NEGATIVE  01/02/2019 1427   KETONESUR NEGATIVE 01/02/2019 1427   PROTEINUR 30 (A) 01/02/2019 1427   NITRITE NEGATIVE 01/02/2019 1427   LEUKOCYTESUR LARGE (A) 01/02/2019 1427   Sepsis Labs: @LABRCNTIP (procalcitonin:4,lacticidven:4) ) Recent Results (from the past 240 hour(s))  SARS CORONAVIRUS 2 (TAT 6-24 HRS) Nasopharyngeal Nasopharyngeal Swab     Status: None   Collection Time: 01/18/19  7:05 AM   Specimen: Nasopharyngeal Swab  Result Value Ref Range Status   SARS Coronavirus 2 NEGATIVE NEGATIVE Final    Comment: (NOTE) SARS-CoV-2 target nucleic acids are NOT DETECTED. The SARS-CoV-2 RNA is generally detectable in upper and lower respiratory specimens during the acute phase of infection. Negative results do not preclude SARS-CoV-2 infection, do not rule out co-infections with other pathogens, and should not be used as the sole basis for treatment or other patient management decisions. Negative results must be combined with clinical observations, patient history, and epidemiological information. The expected  result is Negative. Fact Sheet for Patients: SugarRoll.be Fact Sheet for Healthcare Providers: https://www.woods-mathews.com/ This test is not yet approved or cleared by the [place] FDA and  has been authorized for detection and/or diagnosis of SARS-CoV-2 by FDA under an Emergency Use Authorization (EUA). This EUA will remain  in effect (meaning this test can be used) for the duration of the COVID-19 declaration under Section 56 4(b)(1) of the Act, 21 U.S.C. section 360bbb-3(b)(1), unless the authorization is terminated or revoked sooner. Performed at [facility], [place] [address], [place], [place] [address]   Blood culture (routine x 2)     Status: None (Preliminary result)   Collection Time: 01/18/19  7:07 AM   Specimen: BLOOD  Result Value Ref Range Status   Specimen Description BLOOD RIGHT ANTECUBITAL  Final   Special  Requests   Final    BOTTLES DRAWN AEROBIC AND ANAEROBIC Blood Culture results may not be optimal due to an excessive volume of blood received in culture bottles   Culture   Final    NO GROWTH 2 DAYS Performed at [facility], [place] [address] [place], [place] [address]    Report Status PENDING  Incomplete  Blood culture (routine x 2)     Status: None (Preliminary result)   Collection Time: 01/18/19  8:15 AM   Specimen: BLOOD LEFT HAND  Result Value Ref Range Status   Specimen Description BLOOD LEFT HAND  Final   Special Requests   Final    BOTTLES DRAWN AEROBIC AND ANAEROBIC Blood Culture results may not be optimal due to an inadequate volume of blood received in culture bottles   Culture   Final    NO GROWTH 2 DAYS Performed at [facility], [place] [address] [place], [place] [address]    Report Status PENDING  Incomplete  Surgical pcr screen     Status: None   Collection Time: 01/19/19  6:48 AM   Specimen: Nasal Mucosa; Nasal Swab  Result Value Ref Range Status   MRSA, PCR NEGATIVE NEGATIVE Final   Staphylococcus aureus NEGATIVE NEGATIVE Final    Comment: (NOTE) The Xpert SA Assay (FDA approved for NASAL specimens in patients 71 years of age and older), is one component of a comprehensive surveillance program. It is not intended to diagnose infection nor to guide or monitor treatment. Performed at [facility], [place] [address], [place],  [address]          Radiology Studies: No results found.    Scheduled Meds: . vitamin C  500 mg Oral Daily  . atorvastatin  40 mg Oral Daily  . Chlorhexidine Gluconate Cloth  6 each Topical Daily  . docusate sodium  100 mg Oral QPM  . dorzolamide-timolol  1 drop Both Eyes BID  . famotidine  20 mg Oral Daily  . fentaNYL      . ferrous sulfate  325 mg Oral BID WC  . finasteride  5 mg Oral QPM  . furosemide  40 mg Oral Daily  . insulin aspart  0-9 Units Subcutaneous TID WC  . levothyroxine  50 mcg Oral Q0600   . multivitamin with minerals  1 tablet Oral Daily  . nutrition supplement (JUVEN)  1 packet Oral BID BM  . potassium chloride  10 mEq Oral Daily  . pyridoxine  100 mg Oral Daily  . senna-docusate  2 tablet Oral QHS  . sodium chloride flush  3 mL Intravenous Once  .  sodium chloride flush  3 mL Intravenous Q12H  . tamsulosin  0.4 mg Oral BID  . vitamin E  400 Units Oral Daily   Continuous Infusions: . sodium chloride    . cefTRIAXone (ROCEPHIN)  IV 2 g (01/20/19 0654)  . heparin Stopped (01/20/19 0947)  . metronidazole 500 mg (01/20/19 1527)  . vancomycin 750 mg (01/19/19 0919)     LOS: 2 days      Debbe Odea, MD [facility] Pager: [email or web address] Password [facility] 01/20/2019, 5:35 PM

## 2019-01-20 NOTE — Interval H&P Note (Signed)
History and Physical Interval Note:  01/20/2019 7:09 AM  [name]  has presented today for surgery, with the diagnosis of ABSCESS LEFT BELOW KNEE AMPUTATION.  The various methods of treatment have been discussed with the patient and family. After consideration of risks, benefits and other options for treatment, the patient has consented to  Procedure(s): REVISION LEFT BELOW KNEE AMPUTATION (Left) as a surgical intervention.  The patient's history has been reviewed, patient examined, no change in status, stable for surgery.  I have reviewed the patient's chart and labs.  Questions were answered to the patient's satisfaction.     [name]

## 2019-01-20 NOTE — Op Note (Signed)
01/20/2019  12:41 PM  PATIENT:  [name]    PRE-OPERATIVE DIAGNOSIS:  ABSCESS LEFT BELOW KNEE AMPUTATION  POST-OPERATIVE DIAGNOSIS:  Same  PROCEDURE:  REVISION LEFT BELOW KNEE AMPUTATION Application of Praveena customizable and arthroform wound VAC  SURGEON:  [name], MD  PHYSICIAN ASSISTANT: April Green ANESTHESIA:   General  PREOPERATIVE INDICATIONS:  [name] is a  86 y.o. male with a diagnosis of ABSCESS LEFT BELOW KNEE AMPUTATION who failed conservative measures and elected for surgical management.    The risks benefits and alternatives were discussed with the patient preoperatively including but not limited to the risks of infection, bleeding, nerve injury, cardiopulmonary complications, the need for revision surgery, among others, and the patient was willing to proceed.  OPERATIVE IMPLANTS: None  @ENCIMAGES @  OPERATIVE FINDINGS: Healthy viable muscle at the amputation margins  OPERATIVE PROCEDURE: Patient was brought the operating room and underwent a general anesthetic.  After adequate levels anesthesia were obtained patient's left lower extremity was prepped using DuraPrep draped into a sterile field a timeout was called.  A fishmouth incision was made around the necrotic wound.  This was carried sharply down to bone.  The distal 2 cm of bone was resected the distal 2 cm of the fibula was resected as well.  The vascular bundle was clamped and suture ligated with 2-0 silk.  The amputation was completed and the wound margins were clear without involvement of the necrotic tissue from the wound dehiscence.  Wound was irrigated with normal saline the deep and superficial fascia layers and skin was closed using 2-0 nylon.  A Praveena customizable and Arnell Sieving form dressing was applied this had a good suction fit patient was extubated taken the PACU in stable condition.   DISCHARGE PLANNING:  Antibiotic duration: 24-hour postoperative antibiotics  Weightbearing:  Nonweightbearing on the left  Pain medication: Opioid pathway  Dressing care/ Wound VAC: Continue wound VAC 1 week after discharge  Ambulatory devices: Walker or wheelchair  Discharge to: Skilled nursing facility  Follow-up: In the office 1 week post operative.

## 2019-01-20 NOTE — Progress Notes (Signed)
ANTICOAGULATION CONSULT NOTE - Follow Up Consult  Pharmacy Consult for apixaban Indication: atrial fibrillation  Allergies  Allergen Reactions  . Iodinated Diagnostic Agents     Other reaction(s): Fever  . Dye Fdc Red [Red Dye] Hives    Tolerated red Docusate, Niferex  . Iodine-131 Other (See Comments)    Breakout, fever  . Iohexol      Code: RASH, Desc: PT STATES HOT ALL OVER HAD TO HAVE ICE PACKS ALL OVER HIM AND DIFF BREATHING, PT REFUSED IV DYE     Patient Measurements: Height: 5' 10.98" (180.3 cm) Weight: 188 lb (85.3 kg) IBW/kg (Calculated) : 75.26 Heparin Dosing Weight: 85.3 kg  Vital Signs: Temp: 98.3 F (36.8 C) (01/06 1719) Temp Source: Oral (01/06 1719) BP: 130/54 (01/06 1719) Pulse Rate: 64 (01/06 1719)  Labs: Recent Labs    01/18/19 0623 01/19/19 0650 01/19/19 1023 01/19/19 2042 01/20/19 0335  HGB 9.9* 9.0*  --   --  8.6*  HCT 30.8* 27.2*  --   --  26.5*  PLT 263 222  --   --  240  APTT  --   --  145* 107* 97*  HEPARINUNFRC  --   --  >2.20*  --  1.84*  CREATININE 1.29* 1.12  --   --  1.26*    Estimated Creatinine Clearance: 42.3 mL/min (A) (by C-G formula based on SCr of 1.26 mg/dL (H)).  Assessment: 86 y.o. male presented with possible wound infection/cellulitis with history of a fib. Patient was on apixaban 5 mg BID prior to admission and on hold for Revision of L BKA scheduled on 01/19/18.  He is no s/p procedure. Spoke with Dr. Sharol Given and plans are for no heparin and to start apixaban on 01/21/19.   Goal of Therapy:  Heparin level 0.3-0.7 units/ml aPTT 66-102 seconds Monitor platelets by anticoagulation protocol: Yes   Plan:  -Will start apixaban 5mg  po bid (first dose in the evening) -CBC in am  Hildred Laser, PharmD Clinical Pharmacist **Pharmacist phone directory can now be found on [email or web address] ([email or web address]).  Listed under [place].

## 2019-01-20 NOTE — Anesthesia Preprocedure Evaluation (Addendum)
Anesthesia Evaluation  Patient identified by MRN, date of birth, ID band Patient awake    Reviewed: Allergy & Precautions, NPO status , Patient's Chart, lab work & pertinent test results  Airway Mallampati: II  TM Distance: >3 FB Neck ROM: Full    Dental no notable dental hx. (+) Upper Dentures, Lower Dentures   Pulmonary sleep apnea , former smoker,    Pulmonary exam normal        Cardiovascular hypertension, Pt. on medications + Peripheral Vascular Disease and +CHF  + dysrhythmias Atrial Fibrillation + Valvular Problems/Murmurs AS  Rhythm:Regular Rate:Normal  ECHO 11/19 Left ventricle: The cavity size was normal. There was moderate   concentric hypertrophy. Systolic function was normal. The   estimated ejection fraction was in the range of 60% to 65%. Wall   motion was normal; there were no regional wall motion   abnormalities. Features are consistent with a pseudonormal left   ventricular filling pattern, with concomitant abnormal relaxation   and increased filling pressure (grade 2 diastolic dysfunction).   Doppler parameters are consistent with high ventricular filling   pressure. - Aortic valve: Trileaflet; mildly thickened, moderately calcified   leaflets. There was moderate stenosis. Valve area (VTI): 1.47   cm^2. Valve area (Vmax): 1.4 cm^2. Valve area (Vmean): 1.38 cm^2. - Mitral valve: Mildly calcified annulus. Mildly thickened leaflets   . There was mild regurgitation. - Left atrium: The atrium was mildly to moderately dilated. - Tricuspid valve: There was mild regurgitation. - Pulmonary arteries: PA peak pressure: 31 mm Hg (S).   Neuro/Psych negative neurological ROS  negative psych ROS   GI/Hepatic Neg liver ROS, GERD  ,  Endo/Other  diabetes, Type 2Hypothyroidism   Renal/GU CRFRenal disease  negative genitourinary   Musculoskeletal negative musculoskeletal ROS (+)   Abdominal   Peds negative  pediatric ROS (+)  Hematology  (+) Blood dyscrasia, anemia ,   Anesthesia Other Findings Echo 08/27/18: EF 55-60%, mild LVH, normal RV function, mild MR, moderate AS (mean gradient 25 mmHg), PASP 44  Reproductive/Obstetrics negative OB ROS                             Anesthesia Physical  Anesthesia Plan  ASA: III  Anesthesia Plan: General   Post-op Pain Management:    Induction: Intravenous  PONV Risk Score and Plan: 1 and Ondansetron and Treatment may vary due to age or medical condition  Airway Management Planned: LMA and Oral ETT  Additional Equipment: None  Intra-op Plan:   Post-operative Plan: Extubation in OR  Informed Consent: I have reviewed the patients History and Physical, chart, labs and discussed the procedure including the risks, benefits and alternatives for the proposed anesthesia with the patient or authorized representative who has indicated his/her understanding and acceptance.     Dental advisory given  Plan Discussed with: CRNA, Surgeon and Anesthesiologist  Anesthesia Plan Comments: ( )       Anesthesia Quick Evaluation

## 2019-01-20 NOTE — Telephone Encounter (Signed)
Patient still in hospital.

## 2019-01-20 NOTE — Anesthesia Procedure Notes (Signed)
Procedure Name: LMA Insertion Date/Time: 01/20/2019 12:04 PM Performed by: Oletta Lamas, CRNA Pre-anesthesia Checklist: Patient identified, Emergency Drugs available, Suction available and Patient being monitored Patient Re-evaluated:Patient Re-evaluated prior to induction Oxygen Delivery Method: Circle System Utilized Preoxygenation: Pre-oxygenation with 100% oxygen Induction Type: IV induction Ventilation: Mask ventilation without difficulty LMA: LMA inserted LMA Size: 5.0 Number of attempts: 1 Placement Confirmation: positive ETCO2 Tube secured with: Tape Dental Injury: Teeth and Oropharynx as per pre-operative assessment

## 2019-01-20 NOTE — Progress Notes (Signed)
Dr. Jess Barters PA, Kathlee Nations, notified that patient is having continued pain despite fentanyl, dilaudid and oxy and is c/o that the 'wound vac' is causing the pain.

## 2019-01-21 LAB — CBC
HCT: 24.1 % — ABNORMAL LOW (ref 39.0–52.0)
Hemoglobin: 8 g/dL — ABNORMAL LOW (ref 13.0–17.0)
MCH: 31.3 pg (ref 26.0–34.0)
MCHC: 33.2 g/dL (ref 30.0–36.0)
MCV: 94.1 fL (ref 80.0–100.0)
Platelets: 219 10*3/uL (ref 150–400)
RBC: 2.56 MIL/uL — ABNORMAL LOW (ref 4.22–5.81)
RDW: 17.2 % — ABNORMAL HIGH (ref 11.5–15.5)
WBC: 8.9 10*3/uL (ref 4.0–10.5)
nRBC: 0 % (ref 0.0–0.2)

## 2019-01-21 LAB — GLUCOSE, CAPILLARY
Glucose-Capillary: 142 mg/dL — ABNORMAL HIGH (ref 70–99)
Glucose-Capillary: 120 mg/dL — ABNORMAL HIGH (ref 70–99)
Glucose-Capillary: 172 mg/dL — ABNORMAL HIGH (ref 70–99)
Glucose-Capillary: 137 mg/dL — ABNORMAL HIGH (ref 70–99)

## 2019-01-21 MED ORDER — APIXABAN 5 MG PO TABS
5.0000 mg | ORAL_TABLET | Freq: Two times a day (BID) | ORAL | Status: DC
  Administered 2019-01-21 – 2019-01-22 (×2): 5 mg via ORAL
  Filled 2019-01-21 (×2): qty 1

## 2019-01-21 MED ORDER — METRONIDAZOLE IN NACL 5-0.79 MG/ML-% IV SOLN
500.0000 mg | Freq: Three times a day (TID) | INTRAVENOUS | Status: AC
  Administered 2019-01-21: 500 mg via INTRAVENOUS
  Filled 2019-01-21: qty 100

## 2019-01-21 NOTE — Evaluation (Signed)
Occupational Therapy Evaluation Patient Details Name: [name] MRN: [id number] DOB: [birth year] Today's Date: 01/21/2019    History of Present Illness Pt is an 86 yo male s/p revision on L transtibial amputation due to cellulitis. PMHx: T2DM, PAD, bilateral transtibial amputations, HTN.   Clinical Impression   Pt PTA: at facility vs home with lift, hospital bed and wheelchair. Pt reports he requires assistance with ADL other tahn feeding/grooming. Pt uses lift at home for transfers. Pt with RLE prosthetic. Pt reports pain in LLE, but able to tolerate long sitting in bed using rails. Pt set-upA to maxA for ADL and appears to have family support/caregivers at home. Pt does not require continued OT as pt at his functional baseline. OT signing off acutely.    Follow Up Recommendations  No OT follow up;Home health OT;Supervision/Assistance - 24 hour;Follow surgeon's recommendation for DC plan and follow-up therapies(Pt appears at baseline. Reports having DME at home.)    Equipment Recommendations  None recommended by OT    Recommendations for Other Services       Precautions / Restrictions Precautions Precautions: Fall Precaution Comments: wound VAC, L BKA revision, prior R BKA Restrictions Weight Bearing Restrictions: Yes LLE Weight Bearing: Non weight bearing      Mobility Bed Mobility Overal bed mobility: Needs Assistance Bed Mobility: Supine to Sit;Sit to Supine;Rolling Rolling: Mod assist;Max assist;+2 for physical assistance   Supine to sit: Mod assist;HOB elevated Sit to supine: Min assist   General bed mobility comments: pt able to use bilateral UEs on bed rails to achieve long sitting in bed with mod A for trunk elevation. Pt performed x4. He did require both arms to maintain support. Pt also able to roll bilaterally with mod-max A x2 for pad placement  Transfers                 General transfer comment: deferred. Pt hoyer lift at home/facility.    Balance  Overall balance assessment: Needs assistance Sitting-balance support: Bilateral upper extremity supported Sitting balance-Leahy Scale: Poor                                     ADL either performed or assessed with clinical judgement   ADL Overall ADL's : Needs assistance/impaired Eating/Feeding: Set up;Bed level Eating/Feeding Details (indicate cue type and reason): long sitting for ~30 secs at a time before fatigues Grooming: Set up;Sitting Grooming Details (indicate cue type and reason): long sitting for ~30 secs at a time before fatigues Upper Body Bathing: Minimal assistance;Bed level   Lower Body Bathing: Total assistance;Sitting/lateral leans;Bed level   Upper Body Dressing : Minimal assistance;Sitting;Bed level   Lower Body Dressing: Total assistance;+2 for physical assistance;+2 for safety/equipment;Sitting/lateral leans;Bed level   Toilet Transfer: Total assistance;+2 for physical assistance;+2 for safety/equipment   Toileting- Clothing Manipulation and Hygiene: Total assistance;+2 for physical assistance;+2 for safety/equipment;Bed level       Functional mobility during ADLs: Total assistance;+2 for physical assistance;+2 for safety/equipment General ADL Comments: Pt limited by decreased strength, increased pain and decreased ability to care for self.     Vision Baseline Vision/History: Wears glasses Wears Glasses: At all times Patient Visual Report: No change from baseline Vision Assessment?: No apparent visual deficits     Perception     Praxis      Pertinent Vitals/Pain Pain Assessment: Faces Faces Pain Scale: Hurts even more Pain Location: L residual limb Pain Descriptors /  Indicators: Grimacing;Guarding Pain Intervention(s): Monitored during session     Hand Dominance Right   Extremity/Trunk Assessment Upper Extremity Assessment Upper Extremity Assessment: Generalized weakness   Lower Extremity Assessment Lower Extremity  Assessment: RLE deficits/detail RLE Deficits / Details: prior transtibial amputation, full knee extension, grossly 3/5       Communication Communication Communication: HOH   Cognition Arousal/Alertness: Awake/alert Behavior During Therapy: WFL for tasks assessed/performed Overall Cognitive Status: No family/caregiver present to determine baseline cognitive functioning Area of Impairment: Memory;Problem solving                     Memory: Decreased short-term memory;Decreased recall of precautions       Problem Solving: Slow processing;Requires verbal cues;Requires tactile cues     General Comments  VSS. Pt appeared to answer questions correctly per chart from previous stay.    Exercises     Shoulder Instructions      Home Living Family/patient expects to be discharged to:: Skilled nursing facility Living Arrangements: Children Available Help at Discharge: Family;Available 24 hours/day Type of Home: House Home Access: Ramped entrance     Home Layout: One level     Bathroom Shower/Tub: Occupational psychologist: Standard         Additional Comments: Unsure of home set-up as pt has h/o cognitive impairments      Prior Functioning/Environment Level of Independence: Needs assistance  Gait / Transfers Assistance Needed: at home using a lift to get OOB ADL's / Homemaking Assistance Needed: "staff helps me" "I can feed myself"   Comments: unsure of accuracy of information provided with no caregivers/family present to provide any information        OT Problem List: Decreased strength;Decreased activity tolerance      OT Treatment/Interventions:      OT Goals(Current goals can be found in the care plan section) Acute Rehab OT Goals Patient Stated Goal: decrease pain  OT Frequency:     Barriers to D/C:            Co-evaluation PT/OT/SLP Co-Evaluation/Treatment: Yes Reason for Co-Treatment: Complexity of the patient's impairments  (multi-system involvement);To address functional/ADL transfers   OT goals addressed during session: ADL's and self-care      AM-PAC OT "6 Clicks" Daily Activity     Outcome Measure Help from another person eating meals?: A Little Help from another person taking care of personal grooming?: A Little Help from another person toileting, which includes using toliet, bedpan, or urinal?: A Lot Help from another person bathing (including washing, rinsing, drying)?: A Lot Help from another person to put on and taking off regular upper body clothing?: A Little Help from another person to put on and taking off regular lower body clothing?: A Lot 6 Click Score: 15   End of Session Nurse Communication: Mobility status  Activity Tolerance: Patient limited by fatigue;Patient limited by pain Patient left: in bed;with call bell/phone within reach  OT Visit Diagnosis: Muscle weakness (generalized) (M62.81);Pain Pain - Right/Left: Left Pain - part of body: Knee                Time: 7001-7494 OT Time Calculation (min): 34 min Charges:  OT General Charges $OT Visit: 1 Visit OT Evaluation $OT Eval Moderate Complexity: [place] OTR/L Acute Rehabilitation Services Pager: [phone] Office: [phone]   [name] C 01/21/2019, 2:19 PM

## 2019-01-21 NOTE — Progress Notes (Signed)
PROGRESS NOTE    BRIT WERNETTE   IZT:[id number]  DOB: 07-24-1927  DOA: 01/17/2019 PCP: Redmond School, MD   Brief Narrative:  [name] 86 y.o.malewith medical history significant forparoxysmal atrial fibrillation on Eliquis, type 2 diabetes mellitus, peripheral arterial disease, status post bilateral BKA with left BKA performed on 12/18/2018, now presenting to the emergency department with pain and redness at the left BKA stump with severe pain, bloody and malodorous drainage from the incision site.  Admitted for left BKA postop wound infection/cellulitis.  Treating with empiric antibiotics.  Orthopedics/Dr. Sharol Given consulted and if does not improve, plans surgical I&D on 1/6.  Eliquis held in case surgery needed and switched to IV heparin.   Subjective: No complaints.   Assessment & Plan:   Principal Problem:   Postoperative wound infection -Patient is status post left BKA on 12/4 with subsequent wound infection - He was started on ceftriaxone, IV Flagyl and vancomycin-antibiotics are to continue 24 hours postop- will d/c after today's doses  -1/6- underwent BKA    Active Problems: Paroxysmal A. fib -Eliquis on hold due to surgery- Can resume today per ortho  Peripheral artery disease -On aspirin and Plavix at home- these remain on hold    Essential hypertension, benign-chronic diastolic heart failure -Last 2D echo on 08/27/2018 revealed an EF of 55 to 60% -Lasix being continued however, the patient appears to be dehydrated therefore I will hold Lasix for now  CKD stage IIIa -Creatinine 1.2 and is near baseline    Hypothyroidism -Continue Synthroid    Type 2 diabetes mellitus with vascular disease ([place]) -Currently on a low-dose sliding scale with meals -Hemoglobin A1c was 5.9 on 10/20 -Glipizide is on hold  Anemia of chronic disease -Continue to follow hemoglobin   Time spent in minutes: 30 DVT prophylaxis: Heparin infusion Code Status: Do not  resuscitate Family Communication:  Disposition Plan: SNF with wound VAC Consultants:   Orthopedic surgery Procedures:   Revision left BKA Antimicrobials:  Anti-infectives (From admission, onward)   Start     Dose/Rate Route Frequency Ordered Stop   01/18/19 0800  vancomycin (VANCOREADY) IVPB 750 mg/150 mL     750 mg 150 mL/hr over 60 Minutes Intravenous Every 24 hours 01/18/19 0752     01/18/19 0745  cefTRIAXone (ROCEPHIN) 2 g in sodium chloride 0.9 % 100 mL IVPB     2 g 200 mL/hr over 30 Minutes Intravenous Every 24 hours 01/18/19 0736     01/18/19 0745  metroNIDAZOLE (FLAGYL) IVPB 500 mg     500 mg 100 mL/hr over 60 Minutes Intravenous Every 8 hours 01/18/19 0736         Objective: Vitals:   01/21/19 0026 01/21/19 0400 01/21/19 0442 01/21/19 0808  BP: (!) 128/54  (!) 135/50 (!) 134/57  Pulse: 63  (!) 56 61  Resp: 16 16 15 16   Temp: (!) 97.4 F (36.3 C)  (!) 97.5 F (36.4 C) 98.5 F (36.9 C)  TempSrc: Oral  Oral Oral  SpO2: 95%  96% 96%  Weight:      Height:        Intake/Output Summary (Last 24 hours) at 01/21/2019 1019 Last data filed at 01/21/2019 0900 Gross per 24 hour  Intake 1320 ml  Output 1530 ml  Net -210 ml   Filed Weights   01/19/19 1200 01/20/19 1054  Weight: 85.3 kg 85.3 kg    Examination: General exam: Appears comfortable  HEENT: PERRLA, oral mucosa moist, no sclera icterus or  thrush Respiratory system: Clear to auscultation. Respiratory effort normal. Cardiovascular system: S1 & S2 heard,  No murmurs 2/6 murmur at apex Gastrointestinal system: Abdomen soft, non-tender, nondistended. Normal bowel sounds   Central nervous system: Alert and oriented. No focal neurological deficits. Extremities: No cyanosis, clubbing or edema- b/l BKA - wound vac on left stump Skin: No rashes or ulcers Psychiatry:  Mood & affect appropriate.    Data Reviewed: I have personally reviewed following labs and imaging studies  CBC: Recent Labs  Lab 01/17/19 1632  01/18/19 0623 01/19/19 0650 01/20/19 0335 01/21/19 0842  WBC 12.0* 12.1* 9.5 9.0 8.9  NEUTROABS 6.9  --   --   --   --   HGB 9.7* 9.9* 9.0* 8.6* 8.0*  HCT 31.1* 30.8* 27.2* 26.5* 24.1*  MCV 100.3* 96.3 94.4 95.0 94.1  PLT 253 263 222 240 193   Basic Metabolic Panel: Recent Labs  Lab 01/17/19 1632 01/18/19 0623 01/19/19 0650 01/20/19 0335  NA 135 131* 134* 133*  K 3.8 3.7 3.8 3.6  CL 100 95* 98 96*  CO2 26 27 25 27   GLUCOSE 165* 133* 124* 137*  BUN 25* 25* 24* 30*  CREATININE 1.34* 1.29* 1.12 1.26*  CALCIUM 8.5* 8.6* 8.8* 8.7*   GFR: Estimated Creatinine Clearance: 42.3 mL/min (A) (by C-G formula based on SCr of 1.26 mg/dL (H)). Liver Function Tests: Recent Labs  Lab 01/17/19 1632  AST 21  ALT 17  ALKPHOS 64  BILITOT 0.6  PROT 6.4*  ALBUMIN 3.0*   No results for input(s): LIPASE, AMYLASE in the last 168 hours. No results for input(s): AMMONIA in the last 168 hours. Coagulation Profile: No results for input(s): INR, PROTIME in the last 168 hours. Cardiac Enzymes: No results for input(s): CKTOTAL, CKMB, CKMBINDEX, TROPONINI in the last 168 hours. BNP (last 3 results) No results for input(s): PROBNP in the last 8760 hours. HbA1C: Recent Labs    01/19/19 0650  HGBA1C 5.6   CBG: Recent Labs  Lab 01/20/19 1044 01/20/19 1241 01/20/19 1700 01/20/19 2215 01/21/19 0641  GLUCAP 121* 124* 160* 150* 142*   Lipid Profile: No results for input(s): CHOL, HDL, LDLCALC, TRIG, CHOLHDL, LDLDIRECT in the last 72 hours. Thyroid Function Tests: No results for input(s): TSH, T4TOTAL, FREET4, T3FREE, THYROIDAB in the last 72 hours. Anemia Panel: No results for input(s): VITAMINB12, FOLATE, FERRITIN, TIBC, IRON, RETICCTPCT in the last 72 hours. Urine analysis:    Component Value Date/Time   COLORURINE YELLOW 01/02/2019 1427   APPEARANCEUR HAZY (A) 01/02/2019 1427   LABSPEC 1.011 01/02/2019 1427   PHURINE 6.0 01/02/2019 1427   GLUCOSEU NEGATIVE 01/02/2019 1427    HGBUR MODERATE (A) 01/02/2019 1427   BILIRUBINUR NEGATIVE 01/02/2019 1427   KETONESUR NEGATIVE 01/02/2019 1427   PROTEINUR 30 (A) 01/02/2019 1427   NITRITE NEGATIVE 01/02/2019 1427   LEUKOCYTESUR LARGE (A) 01/02/2019 1427   Sepsis Labs: @LABRCNTIP (procalcitonin:4,lacticidven:4) ) Recent Results (from the past 240 hour(s))  SARS CORONAVIRUS 2 (TAT 6-24 HRS) Nasopharyngeal Nasopharyngeal Swab     Status: None   Collection Time: 01/18/19  7:05 AM   Specimen: Nasopharyngeal Swab  Result Value Ref Range Status   SARS Coronavirus 2 NEGATIVE NEGATIVE Final    Comment: (NOTE) SARS-CoV-2 target nucleic acids are NOT DETECTED. The SARS-CoV-2 RNA is generally detectable in upper and lower respiratory specimens during the acute phase of infection. Negative results do not preclude SARS-CoV-2 infection, do not rule out co-infections with other pathogens, and should not be used as the  sole basis for treatment or other patient management decisions. Negative results must be combined with clinical observations, patient history, and epidemiological information. The expected result is Negative. Fact Sheet for Patients: SugarRoll.be Fact Sheet for Healthcare Providers: https://www.woods-mathews.com/ This test is not yet approved or cleared by the [place] FDA and  has been authorized for detection and/or diagnosis of SARS-CoV-2 by FDA under an Emergency Use Authorization (EUA). This EUA will remain  in effect (meaning this test can be used) for the duration of the COVID-19 declaration under Section 56 4(b)(1) of the Act, 21 U.S.C. section 360bbb-3(b)(1), unless the authorization is terminated or revoked sooner. Performed at [facility], [place] [address], [place], [place] [address]   Blood culture (routine x 2)     Status: None (Preliminary result)   Collection Time: 01/18/19  7:07 AM   Specimen: BLOOD  Result Value Ref Range Status   Specimen  Description BLOOD RIGHT ANTECUBITAL  Final   Special Requests   Final    BOTTLES DRAWN AEROBIC AND ANAEROBIC Blood Culture results may not be optimal due to an excessive volume of blood received in culture bottles   Culture   Final    NO GROWTH 2 DAYS Performed at [facility], [place] [address] [place], [place] [address]    Report Status PENDING  Incomplete  Blood culture (routine x 2)     Status: None (Preliminary result)   Collection Time: 01/18/19  8:15 AM   Specimen: BLOOD LEFT HAND  Result Value Ref Range Status   Specimen Description BLOOD LEFT HAND  Final   Special Requests   Final    BOTTLES DRAWN AEROBIC AND ANAEROBIC Blood Culture results may not be optimal due to an inadequate volume of blood received in culture bottles   Culture   Final    NO GROWTH 2 DAYS Performed at [facility], [place] [address], [place], [place] [address]    Report Status PENDING  Incomplete  Surgical pcr screen     Status: None   Collection Time: 01/19/19  6:48 AM   Specimen: Nasal Mucosa; Nasal Swab  Result Value Ref Range Status   MRSA, PCR NEGATIVE NEGATIVE Final   Staphylococcus aureus NEGATIVE NEGATIVE Final    Comment: (NOTE) The Xpert SA Assay (FDA approved for NASAL specimens in patients 69 years of age and older), is one component of a comprehensive surveillance program. It is not intended to diagnose infection nor to guide or monitor treatment. Performed at [facility], [place] [address], [place], [place] [address]          Radiology Studies: No results found.    Scheduled Meds: . vitamin C  500 mg Oral Daily  . atorvastatin  40 mg Oral Daily  . Chlorhexidine Gluconate Cloth  6 each Topical Daily  . docusate sodium  100 mg Oral QPM  . dorzolamide-timolol  1 drop Both Eyes BID  . famotidine  20 mg Oral Daily  . ferrous sulfate  325 mg Oral BID WC  . finasteride  5 mg Oral QPM  . insulin aspart  0-9 Units Subcutaneous TID WC  . levothyroxine  50 mcg Oral  Q0600  . multivitamin with minerals  1 tablet Oral Daily  . nutrition supplement (JUVEN)  1 packet Oral BID BM  . potassium chloride  10 mEq Oral Daily  . pyridoxine  100 mg Oral Daily  . senna-docusate  2 tablet Oral QHS  . sodium  chloride flush  3 mL Intravenous Once  . sodium chloride flush  3 mL Intravenous Q12H  . tamsulosin  0.4 mg Oral BID  . vitamin E  400 Units Oral Daily   Continuous Infusions: . sodium chloride    . cefTRIAXone (ROCEPHIN)  IV 2 g (01/21/19 0741)  . metronidazole 500 mg (01/21/19 0743)  . vancomycin 750 mg (01/21/19 0937)     LOS: 3 days      Debbe Odea, MD [facility] Pager: [email or web address] Password [facility] 01/21/2019, 10:19 AM

## 2019-01-21 NOTE — Progress Notes (Signed)
Doing well. States pain better controlled than yesterday. VAC in place and working. [place] in [place].  Plan follow up with Dr. Sharol Given 1 week. Will work with  PT OT . Plan DC tomorrow from an orthopedic standpoint. SNF vs Home

## 2019-01-21 NOTE — Evaluation (Signed)
Physical Therapy Evaluation Patient Details Name: [name] MRN: [id number] DOB: 03/11/1927 Today's Date: 01/21/2019   History of Present Illness  Pt is an 86 yo male s/p revision on L transtibial amputation due to cellulitis. PMHx: T2DM, PAD, bilateral transtibial amputations, HTN.    Clinical Impression  Pt presented supine in bed with HOB elevated, awake and willing to participate in therapy session. Prior to admission, pt reported that he required assistance with transfers (with use of a lift) and for ADLs. Pt lives at home with his family where he has 24/7 supervision/assistance. At the time of evaluation, pt limited secondary to pain in L residual limb. He was able to tolerate bed mobility with mod-max A x2. He appears to be at his baseline in regards to functional mobility. Spoke with pt's son via telephone to confirm that they have all necessary DME to continue to care for him at home, and that they do want the pt to return home from the hospital. No further acute PT needs identified at this time. PT signing off.    Follow Up Recommendations Supervision/Assistance - 24 hour    Equipment Recommendations  None recommended by PT;Other (comment)(pt reported having all DME necessary at home)    Recommendations for Other Services       Precautions / Restrictions Precautions Precautions: Fall Precaution Comments: wound VAC, L BKA revision, prior R BKA Restrictions Weight Bearing Restrictions: Yes LLE Weight Bearing: Non weight bearing      Mobility  Bed Mobility Overal bed mobility: Needs Assistance Bed Mobility: Supine to Sit;Sit to Supine;Rolling Rolling: Mod assist;Max assist;+2 for physical assistance   Supine to sit: Mod assist;HOB elevated Sit to supine: Min assist   General bed mobility comments: pt able to use bilateral UEs on bed rails to achieve upright sitting in bed with mod A for trunk elevation. Pt performed x4. He did require both arms to maintain support. Pt  also able to roll bilaterally with mod-max A x2 for pad placement  Transfers                    Ambulation/Gait                Stairs            Wheelchair Mobility    Modified Rankin (Stroke Patients Only)       Balance Overall balance assessment: Needs assistance Sitting-balance support: Bilateral upper extremity supported Sitting balance-Leahy Scale: Poor                                       Pertinent Vitals/Pain Pain Assessment: Faces Faces Pain Scale: Hurts even more Pain Location: L residual limb Pain Descriptors / Indicators: Grimacing;Guarding Pain Intervention(s): Monitored during session;Repositioned    Home Living Family/patient expects to be discharged to:: Skilled nursing facility Living Arrangements: Children Available Help at Discharge: Family;Available 24 hours/day Type of Home: House Home Access: Ramped entrance     Home Layout: One level   Additional Comments: Unsure of home set-up as pt has h/o cognitive impairments    Prior Function Level of Independence: Needs assistance   Gait / Transfers Assistance Needed: at home using a lift to get OOB  ADL's / Homemaking Assistance Needed: "staff helps me" "I can feed myself"  Comments: unsure of accuracy of information provided with no caregivers/family present to provide any information  Hand Dominance        Extremity/Trunk Assessment   Upper Extremity Assessment Upper Extremity Assessment: Defer to OT evaluation;Generalized weakness    Lower Extremity Assessment Lower Extremity Assessment: RLE deficits/detail;LLE deficits/detail RLE Deficits / Details: prior transtibial amputation, full knee extension, grossly 3/5 LLE Deficits / Details: pt with knee flexion contracture noted, unable to fully extend knee secondary to pain and restrictions. Pt maintaining knee at 45 degrees of flexion LLE: Unable to fully assess due to pain       Communication    Communication: HOH  Cognition Arousal/Alertness: Awake/alert Behavior During Therapy: WFL for tasks assessed/performed Overall Cognitive Status: No family/caregiver present to determine baseline cognitive functioning Area of Impairment: Memory;Problem solving                     Memory: Decreased short-term memory;Decreased recall of precautions       Problem Solving: Slow processing;Requires verbal cues;Requires tactile cues        General Comments      Exercises Other Exercises Other Exercises: attempted AROM and AAROM L knee extension; however, pt with too much pain and unable to tolerate   Assessment/Plan    PT Assessment Patent does not need any further PT services  PT Problem List Decreased strength;Decreased range of motion;Decreased activity tolerance;Decreased mobility;Decreased balance;Decreased coordination;Decreased cognition;Decreased knowledge of use of DME;Decreased safety awareness;Decreased knowledge of precautions;Pain       PT Treatment Interventions      PT Goals (Current goals can be found in the Care Plan section)  Acute Rehab PT Goals Patient Stated Goal: decrease pain    Frequency     Barriers to discharge        Co-evaluation PT/OT/SLP Co-Evaluation/Treatment: Yes Reason for Co-Treatment: For patient/therapist safety;To address functional/ADL transfers PT goals addressed during session: Mobility/safety with mobility;Proper use of DME;Balance;Strengthening/ROM         AM-PAC PT "6 Clicks" Mobility  Outcome Measure Help needed turning from your back to your side while in a flat bed without using bedrails?: A Lot Help needed moving from lying on your back to sitting on the side of a flat bed without using bedrails?: A Lot Help needed moving to and from a bed to a chair (including a wheelchair)?: Total Help needed standing up from a chair using your arms (e.g., wheelchair or bedside chair)?: Total Help needed to walk in hospital  room?: Total Help needed climbing 3-5 steps with a railing? : Total 6 Click Score: 8    End of Session   Activity Tolerance: Patient limited by pain Patient left: in bed;with call bell/phone within reach;with bed alarm set Nurse Communication: Mobility status PT Visit Diagnosis: Pain Pain - Right/Left: Left Pain - part of body: Leg    Time: 0071-2197 PT Time Calculation (min) (ACUTE ONLY): 24 min   Charges:   PT Evaluation $PT Eval Moderate Complexity: 1 Mod          Eduard Clos, PT, DPT  Acute Rehabilitation Services Pager [phone] Office [place] 01/21/2019, 11:19 AM

## 2019-01-21 NOTE — Plan of Care (Signed)
  Problem: Education: Goal: Knowledge of General Education information will improve Description: Including pain rating scale, medication(s)/side effects and non-pharmacologic comfort measures Outcome: Adequate for Discharge   

## 2019-01-21 NOTE — Discharge Instructions (Signed)

## 2019-01-22 ENCOUNTER — Ambulatory Visit: Payer: Medicare Other | Admitting: Family

## 2019-01-22 ENCOUNTER — Encounter (HOSPITAL_COMMUNITY): Payer: Self-pay | Admitting: Family Medicine

## 2019-01-22 DIAGNOSIS — Z978 Presence of other specified devices: Secondary | ICD-10-CM

## 2019-01-22 DIAGNOSIS — L03116 Cellulitis of left lower limb: Secondary | ICD-10-CM

## 2019-01-22 DIAGNOSIS — D62 Acute posthemorrhagic anemia: Secondary | ICD-10-CM

## 2019-01-22 LAB — BASIC METABOLIC PANEL
Anion gap: 10 (ref 5–15)
BUN: 28 mg/dL — ABNORMAL HIGH (ref 8–23)
CO2: 26 mmol/L (ref 22–32)
Calcium: 8.4 mg/dL — ABNORMAL LOW (ref 8.9–10.3)
Chloride: 97 mmol/L — ABNORMAL LOW (ref 98–111)
Creatinine, Ser: 1.25 mg/dL — ABNORMAL HIGH (ref 0.61–1.24)
GFR calc Af Amer: 59 mL/min — ABNORMAL LOW (ref >60)
GFR calc non Af Amer: 51 mL/min — ABNORMAL LOW (ref >60)
Glucose, Bld: 180 mg/dL — ABNORMAL HIGH (ref 70–99)
Potassium: 4 mmol/L (ref 3.5–5.1)
Sodium: 133 mmol/L — ABNORMAL LOW (ref 135–145)

## 2019-01-22 LAB — CBC
HCT: 22.5 % — ABNORMAL LOW (ref 39.0–52.0)
Hemoglobin: 7.4 g/dL — ABNORMAL LOW (ref 13.0–17.0)
MCH: 31.1 pg (ref 26.0–34.0)
MCHC: 32.9 g/dL (ref 30.0–36.0)
MCV: 94.5 fL (ref 80.0–100.0)
Platelets: 217 10*3/uL (ref 150–400)
RBC: 2.38 MIL/uL — ABNORMAL LOW (ref 4.22–5.81)
RDW: 17.2 % — ABNORMAL HIGH (ref 11.5–15.5)
WBC: 8.9 10*3/uL (ref 4.0–10.5)
nRBC: 0 % (ref 0.0–0.2)

## 2019-01-22 LAB — GLUCOSE, CAPILLARY
Glucose-Capillary: 140 mg/dL — ABNORMAL HIGH (ref 70–99)
Glucose-Capillary: 174 mg/dL — ABNORMAL HIGH (ref 70–99)
Glucose-Capillary: 137 mg/dL — ABNORMAL HIGH (ref 70–99)

## 2019-01-22 MED ORDER — JUVEN PO PACK: 1.0000 | PACK | Freq: Two times a day (BID) | ORAL | 0 refills | Status: DC

## 2019-01-22 MED ORDER — METHOCARBAMOL 500 MG PO TABS: 500.0000 mg | ORAL_TABLET | Freq: Four times a day (QID) | ORAL | 0 refills | Status: DC | PRN

## 2019-01-22 MED ORDER — OXYCODONE-ACETAMINOPHEN 5-325 MG PO TABS: 1.0000 | ORAL_TABLET | ORAL | 0 refills | Status: DC | PRN

## 2019-01-22 MED ORDER — METHOCARBAMOL 500 MG PO TABS
500.0000 mg | ORAL_TABLET | Freq: Four times a day (QID) | ORAL | Status: DC | PRN
  Administered 2019-01-22: 500 mg via ORAL
  Filled 2019-01-22: qty 1

## 2019-01-22 MED ORDER — KETOROLAC TROMETHAMINE 15 MG/ML IJ SOLN
15.0000 mg | Freq: Once | INTRAMUSCULAR | Status: AC
  Administered 2019-01-22: 15 mg via INTRAVENOUS
  Filled 2019-01-22: qty 1

## 2019-01-22 MED ORDER — APIXABAN 2.5 MG PO TABS: 2.5000 mg | ORAL_TABLET | Freq: Two times a day (BID) | ORAL | 0 refills | Status: DC

## 2019-01-22 NOTE — Plan of Care (Signed)
  Problem: Education: Goal: Knowledge of General Education information will improve Description: Including pain rating scale, medication(s)/side effects and non-pharmacologic comfort measures Outcome: Progressing   Problem: Education: Goal: Knowledge of General Education information will improve Description: Including pain rating scale, medication(s)/side effects and non-pharmacologic comfort measures Outcome: Progressing   Problem: Health Behavior/Discharge Planning: Goal: Ability to manage health-related needs will improve Outcome: Progressing   Problem: Activity: Goal: Risk for activity intolerance will decrease Outcome: Progressing   Problem: Nutrition: Goal: Adequate nutrition will be maintained Outcome: Progressing   Problem: Coping: Goal: Level of anxiety will decrease Outcome: Progressing   Problem: Elimination: Goal: Will not experience complications related to bowel motility Outcome: Progressing   Problem: Pain Managment: Goal: General experience of comfort will improve Outcome: Progressing   Problem: Safety: Goal: Ability to remain free from injury will improve Outcome: Progressing

## 2019-01-22 NOTE — Telephone Encounter (Signed)
Patient has been discharged. Staff message sent to scheduling pool to arrange hospital follow up

## 2019-01-22 NOTE — Progress Notes (Signed)
Asked to see patient saying his stump on left is numb. Patient statessince he was moved this morning he has become painful to straighten his leg.   VAC in place. Tender to palpation over stump.Resists extension . Quad tight. Readjusted stump on pillow and will try some robaxin

## 2019-01-22 NOTE — Clinical Social Work Note (Signed)
Patient's son [name] declining for CSW to set up PTAR. States that his sister will get their handicap accessible [facility] Lei and they will transport patient home.   Unit RN Doroteo Bradford and MD Rizwan aware and agreeable to this plan.

## 2019-01-22 NOTE — Discharge Summary (Addendum)
Physician Discharge Summary  [name] HUD:[id number] DOB: 1927-02-05 DOA: 01/17/2019  PCP: Redmond School, MD  Admit date: 01/17/2019 Discharge date: 01/22/2019  Admitted From: home  Disposition:  home   Recommendations for Outpatient Follow-up:  1. Please check Hb n 1-2 wks 2. Chronic foley should continue to be changed monthly  Home Health:  ordered     Discharge Condition:  stable   CODE STATUS:  DNR   Diet recommendation:  Heart healthy, diabetic Consultants:   Orthopedic surgery Procedures:   Left BKA revision   Discharge Diagnoses:  Principal Problem:   Postoperative wound infection-  Cellulitis Active Problems:   Anemia due to acute blood loss   Chronic indwelling Foley catheter   Essential hypertension, benign   Moderate aortic stenosis   Hypothyroidism   Chronic diastolic CHF (congestive heart failure) (HCC)   PAF (paroxysmal atrial fibrillation) (HCC)   Gastroesophageal reflux disease   Type 2 diabetes mellitus with vascular disease ([place])   DNR (do not resuscitate) discussion   Chronic kidney disease, stage 3a   Urinary retention due to benign prostatic hyperplasia   S/P bilateral BKA (below knee amputation) ([place])     Brief Summary: [name] 86 y.o.malewith medical history significant forparoxysmal atrial fibrillation on Eliquis, type 2 diabetes mellitus, peripheral arterial disease, status post bilateral BKA with left BKA performed on 12/18/2018, now presenting to the emergency department with pain and redness at the left BKA stump with severe pain, bloody and malodorous drainage from the incision site. Admitted for left BKA postop wound infection/cellulitis. Treating with empiric antibiotics. Orthopedics/Dr. Sharol Given consulted and if does not improve, plans surgical I&D on 1/6. Eliquis held in case surgery needed and switched to IV heparin.   Hospital Course:  Principal Problem:   Postoperative wound infection -Patient is status post left BKA  on 12/4 with subsequent wound infection - He was started on ceftriaxone, IV Flagyl and vancomycin which was 24 hours postop- will d/c after today's doses  -1/6- underwent BKA  revision- has a wound vac which he will go home with - Robaxin started for muscle spasms today  Active Problems: Paroxysmal A. fib -Eliquis on hold due to surgery and resumed on 1/7  Peripheral artery disease -On aspirin and Plavix at home- these remain on hold and can be resumed tomorrow    Essential hypertension, benign-chronic diastolic heart failure -Last 2D echo on 08/27/2018 revealed an EF of 55 to 60% -Lasix held for a couple of days due to dehydration- can resume tomorrow  CKD stage IIIa -Creatinine 1.2 and is near baseline    Hypothyroidism -Continue Synthroid    Type 2 diabetes mellitus with vascular disease ([place]) -She was on a low-dose sliding scale in the hospital- sugars well controlled -Hemoglobin A1c was 5.9 on 10/20 -Glipizide is being resumed  Anemia due to acute blood loss superimposed on anemia of chronic disease with iron deficiency -Hemoglobin 8-9 when admitted and is 7.4 today- asymptomatic - f/u as outpt in 1-2 wks please - cont oral Iron    Discharge Exam: Vitals:   01/22/19 0414 01/22/19 0751  BP: (!) 135/48 (!) 163/54  Pulse: (!) 55 66  Resp: 16 18  Temp: 97.6 F (36.4 C) 97.9 F (36.6 C)  SpO2: 97% 97%   Vitals:   01/21/19 1247 01/21/19 2044 01/22/19 0414 01/22/19 0751  BP: (!) 133/55 (!) 127/51 (!) 135/48 (!) 163/54  Pulse: 62 64 (!) 55 66  Resp: 18 17 16 18   Temp: 98.9  F (37.2 C) 98.8 F (37.1 C) 97.6 F (36.4 C) 97.9 F (36.6 C)  TempSrc: Oral Oral Oral Oral  SpO2: 99% 97% 97% 97%  Weight:      Height:        General: Pt is alert, awake, not in acute distress Cardiovascular: RRR, S1/S2 +, no rubs, no gallops Respiratory: CTA bilaterally, no wheezing, no rhonchi Abdominal: Soft, NT, ND, bowel sounds + Extremities: no edema, no  cyanosis   Discharge Instructions  Discharge Instructions    Diet - low sodium heart healthy   Complete by: As directed    Increase activity slowly   Complete by: As directed    Neg Press Wound Therapy / Incisional   Complete by: As directed    Show patient how to attach prevena pump     Allergies as of 01/22/2019      Reactions   Iodinated Diagnostic Agents    Other reaction(s): Fever   Dye Fdc Red [red Dye] Hives   Tolerated red Docusate, Niferex   Iodine-131 Other (See Comments)   Breakout, fever   Iohexol     Code: RASH, Desc: PT STATES HOT ALL OVER HAD TO HAVE ICE PACKS ALL OVER HIM AND DIFF BREATHING, PT REFUSED IV DYE      Medication List    TAKE these medications   acetaminophen 500 MG tablet Commonly known as: TYLENOL Take 1 tablet (500 mg total) by mouth 3 (three) times daily. What changed:   when to take this  reasons to take this   apixaban 5 MG Tabs tablet Commonly known as: ELIQUIS Take 1 tablet (5 mg total) by mouth 2 (two) times daily.   aspirin EC 81 MG tablet Take 81 mg by mouth daily.   atorvastatin 40 MG tablet Commonly known as: LIPITOR Take 1 tablet (40 mg total) by mouth daily.   clopidogrel 75 MG tablet Commonly known as: PLAVIX Take 1 tablet (75 mg total) by mouth daily with breakfast.   diclofenac Sodium 1 % Gel Commonly known as: VOLTAREN Apply 2 g topically 4 (four) times daily. What changed:   when to take this  reasons to take this   docusate sodium 100 MG capsule Commonly known as: COLACE Take 1 capsule (100 mg total) by mouth 2 (two) times daily. What changed: when to take this   dorzolamide-timolol 22.3-6.8 MG/ML ophthalmic solution Commonly known as: COSOPT Place 1 drop into both eyes 2 (two) times daily.   famotidine 20 MG tablet Commonly known as: PEPCID Take 1 tablet (20 mg total) by mouth daily.   ferrous sulfate 325 (65 FE) MG tablet Take 325 mg by mouth 2 (two) times daily with a meal.   finasteride 5  MG tablet Commonly known as: PROSCAR Take 1 tablet (5 mg total) by mouth every evening.   FISH OIL PO Take 1 capsule by mouth daily.   furosemide 40 MG tablet Commonly known as: LASIX Take 1 tablet (40 mg total) by mouth daily.   glipiZIDE 5 MG tablet Commonly known as: GLUCOTROL Take 5 mg by mouth 2 (two) times daily.   GLUCOSAMINE CHOND MSM FORMULA PO Take 1 tablet by mouth 2 (two) times daily.   levothyroxine 50 MCG tablet Commonly known as: SYNTHROID Take 1 tablet (50 mcg total) by mouth daily before breakfast.   methocarbamol 500 MG tablet Commonly known as: ROBAXIN Take 1 tablet (500 mg total) by mouth every 6 (six) hours as needed for muscle spasms.   nutrition  supplement (JUVEN) Pack Take 1 packet by mouth 2 (two) times daily between meals.   oxyCODONE-acetaminophen 5-325 MG tablet Commonly known as: Percocet Take 1 tablet by mouth every 4 (four) hours as needed for severe pain.   polyethylene glycol 17 g packet Commonly known as: MiraLax Take 17 g by mouth daily as needed for moderate constipation.   potassium chloride 10 MEQ tablet Commonly known as: KLOR-CON Take 1 tablet (10 mEq total) by mouth daily.   pyridoxine 100 MG tablet Commonly known as: B-6 Take 100 mg by mouth daily.   senna-docusate 8.6-50 MG tablet Commonly known as: Senokot-S Take 2 tablets by mouth at bedtime.   tamsulosin 0.4 MG Caps capsule Commonly known as: FLOMAX Take 1 capsule (0.4 mg total) by mouth 2 (two) times daily.   traMADol 50 MG tablet Commonly known as: ULTRAM Take 1 tablet (50 mg total) by mouth every 6 (six) hours as needed for severe pain.   vitamin C 500 MG tablet Commonly known as: ASCORBIC ACID Take 500 mg by mouth daily.   vitamin E 400 UNIT capsule Take 400 Units by mouth daily.      Follow-up Information    [name], MD In 1 week.   Specialty: Orthopedic Surgery Contact information: [address] [place] [address] [phone]           Allergies  Allergen Reactions  . Iodinated Diagnostic Agents     Other reaction(s): Fever  . Dye Fdc Red [Red Dye] Hives    Tolerated red Docusate, Niferex  . Iodine-131 Other (See Comments)    Breakout, fever  . Iohexol      Code: RASH, Desc: PT STATES HOT ALL OVER HAD TO HAVE ICE PACKS ALL OVER HIM AND DIFF BREATHING, PT REFUSED IV DYE      Procedures/Studies:  BKA revision 1/6  No results found.   The results of significant diagnostics from this hospitalization (including imaging, microbiology, ancillary and laboratory) are listed below for reference.     Microbiology: Recent Results (from the past 240 hour(s))  SARS CORONAVIRUS 2 (TAT 6-24 HRS) Nasopharyngeal Nasopharyngeal Swab     Status: None   Collection Time: 01/18/19  7:05 AM   Specimen: Nasopharyngeal Swab  Result Value Ref Range Status   SARS Coronavirus 2 NEGATIVE NEGATIVE Final    Comment: (NOTE) SARS-CoV-2 target nucleic acids are NOT DETECTED. The SARS-CoV-2 RNA is generally detectable in upper and lower respiratory specimens during the acute phase of infection. Negative results do not preclude SARS-CoV-2 infection, do not rule out co-infections with other pathogens, and should not be used as the sole basis for treatment or other patient management decisions. Negative results must be combined with clinical observations, patient history, and epidemiological information. The expected result is Negative. Fact Sheet for Patients: SugarRoll.be Fact Sheet for Healthcare Providers: https://www.woods-mathews.com/ This test is not yet approved or cleared by the [place] FDA and  has been authorized for detection and/or diagnosis of SARS-CoV-2 by FDA under an Emergency Use Authorization (EUA). This EUA will remain  in effect (meaning this test can be used) for the duration of the COVID-19 declaration under Section 56 4(b)(1) of the Act, 21 U.S.C. section  360bbb-3(b)(1), unless the authorization is terminated or revoked sooner. Performed at [facility], [place] [address], [place], [place] [address]   Blood culture (routine x 2)     Status: None (Preliminary result)   Collection Time: 01/18/19  7:07 AM   Specimen: BLOOD  Result Value Ref Range Status   Specimen Description BLOOD RIGHT ANTECUBITAL  Final   Special Requests   Final    BOTTLES DRAWN AEROBIC AND ANAEROBIC Blood Culture results may not be optimal due to an excessive volume of blood received in culture bottles   Culture   Final    NO GROWTH 4 DAYS Performed at [facility] [address] [place], [place] [address]    Report Status PENDING  Incomplete  Blood culture (routine x 2)     Status: None (Preliminary result)   Collection Time: 01/18/19  8:15 AM   Specimen: BLOOD LEFT HAND  Result Value Ref Range Status   Specimen Description BLOOD LEFT HAND  Final   Special Requests   Final    BOTTLES DRAWN AEROBIC AND ANAEROBIC Blood Culture results may not be optimal due to an inadequate volume of blood received in culture bottles   Culture   Final    NO GROWTH 4 DAYS Performed at [facility], [place] [address] [place], [place] [address]    Report Status PENDING  Incomplete  Surgical pcr screen     Status: None   Collection Time: 01/19/19  6:48 AM   Specimen: Nasal Mucosa; Nasal Swab  Result Value Ref Range Status   MRSA, PCR NEGATIVE NEGATIVE Final   Staphylococcus aureus NEGATIVE NEGATIVE Final    Comment: (NOTE) The Xpert SA Assay (FDA approved for NASAL specimens in patients 1 years of age and older), is one component of a comprehensive surveillance program. It is not intended to diagnose infection nor to guide or monitor treatment. Performed at [facility], [place] [address], [place], [place] [address]      Labs: BNP (last 3 results) Recent Labs    11/09/18 1209 11/18/18 2132 12/09/18 0343  BNP 258.0* 213.1* 017.5*   Basic Metabolic  Panel: Recent Labs  Lab 01/17/19 1632 01/18/19 0623 01/19/19 0650 01/20/19 0335 01/22/19 0142  NA 135 131* 134* 133* 133*  K 3.8 3.7 3.8 3.6 4.0  CL 100 95* 98 96* 97*  CO2 26 27 25 27 26   GLUCOSE 165* 133* 124* 137* 180*  BUN 25* 25* 24* 30* 28*  CREATININE 1.34* 1.29* 1.12 1.26* 1.25*  CALCIUM 8.5* 8.6* 8.8* 8.7* 8.4*   Liver Function Tests: Recent Labs  Lab 01/17/19 1632  AST 21  ALT 17  ALKPHOS 64  BILITOT 0.6  PROT 6.4*  ALBUMIN 3.0*   No results for input(s): LIPASE, AMYLASE in the last 168 hours. No results for input(s): AMMONIA in the last 168 hours. CBC: Recent Labs  Lab 01/17/19 1632 01/18/19 0623 01/19/19 0650 01/20/19 0335 01/21/19 0842 01/22/19 0142  WBC 12.0* 12.1* 9.5 9.0 8.9 8.9  NEUTROABS 6.9  --   --   --   --   --   HGB 9.7* 9.9* 9.0* 8.6* 8.0* 7.4*  HCT 31.1* 30.8* 27.2* 26.5* 24.1* 22.5*  MCV 100.3* 96.3 94.4 95.0 94.1 94.5  PLT 253 263 222 240 219 217   Cardiac Enzymes: No results for input(s): CKTOTAL, CKMB, CKMBINDEX, TROPONINI in the last 168 hours. BNP: Invalid input(s): POCBNP CBG: Recent Labs  Lab 01/21/19 1113 01/21/19 1636 01/21/19 2121 01/22/19 0640 01/22/19 1113  GLUCAP 120* 172* 137* 140* 174*   D-Dimer No results for input(s): DDIMER in the last 72 hours. Hgb A1c No results for input(s): HGBA1C in the last 72 hours. Lipid Profile No results for input(s): CHOL, HDL, LDLCALC, TRIG, CHOLHDL,  LDLDIRECT in the last 72 hours. Thyroid function studies No results for input(s): TSH, T4TOTAL, T3FREE, THYROIDAB in the last 72 hours.  Invalid input(s): FREET3 Anemia work up No results for input(s): VITAMINB12, FOLATE, FERRITIN, TIBC, IRON, RETICCTPCT in the last 72 hours. Sepsis Labs Invalid input(s): PROCALCITONIN,  WBC,  LACTICIDVEN Microbiology Recent Results (from the past 240 hour(s))  SARS CORONAVIRUS 2 (TAT 6-24 HRS) Nasopharyngeal Nasopharyngeal Swab     Status: None   Collection Time: 01/18/19  7:05 AM    Specimen: Nasopharyngeal Swab  Result Value Ref Range Status   SARS Coronavirus 2 NEGATIVE NEGATIVE Final    Comment: (NOTE) SARS-CoV-2 target nucleic acids are NOT DETECTED. The SARS-CoV-2 RNA is generally detectable in upper and lower respiratory specimens during the acute phase of infection. Negative results do not preclude SARS-CoV-2 infection, do not rule out co-infections with other pathogens, and should not be used as the sole basis for treatment or other patient management decisions. Negative results must be combined with clinical observations, patient history, and epidemiological information. The expected result is Negative. Fact Sheet for Patients: SugarRoll.be Fact Sheet for Healthcare Providers: https://www.woods-mathews.com/ This test is not yet approved or cleared by the [place] FDA and  has been authorized for detection and/or diagnosis of SARS-CoV-2 by FDA under an Emergency Use Authorization (EUA). This EUA will remain  in effect (meaning this test can be used) for the duration of the COVID-19 declaration under Section 56 4(b)(1) of the Act, 21 U.S.C. section 360bbb-3(b)(1), unless the authorization is terminated or revoked sooner. Performed at [facility], [place] [address], [place], [place] [address]   Blood culture (routine x 2)     Status: None (Preliminary result)   Collection Time: 01/18/19  7:07 AM   Specimen: BLOOD  Result Value Ref Range Status   Specimen Description BLOOD RIGHT ANTECUBITAL  Final   Special Requests   Final    BOTTLES DRAWN AEROBIC AND ANAEROBIC Blood Culture results may not be optimal due to an excessive volume of blood received in culture bottles   Culture   Final    NO GROWTH 4 DAYS Performed at [facility], [place] [address] [place], [place] [address]    Report Status PENDING  Incomplete  Blood culture (routine x 2)     Status: None (Preliminary result)   Collection Time:  01/18/19  8:15 AM   Specimen: BLOOD LEFT HAND  Result Value Ref Range Status   Specimen Description BLOOD LEFT HAND  Final   Special Requests   Final    BOTTLES DRAWN AEROBIC AND ANAEROBIC Blood Culture results may not be optimal due to an inadequate volume of blood received in culture bottles   Culture   Final    NO GROWTH 4 DAYS Performed at [facility], [place] [address] [place], [place] [address]    Report Status PENDING  Incomplete  Surgical pcr screen     Status: None   Collection Time: 01/19/19  6:48 AM   Specimen: Nasal Mucosa; Nasal Swab  Result Value Ref Range Status   MRSA, PCR NEGATIVE NEGATIVE Final   Staphylococcus aureus NEGATIVE NEGATIVE Final    Comment: (NOTE) The Xpert SA Assay (FDA approved for NASAL specimens in patients 76 years of age and older), is one component of a comprehensive surveillance program. It is not intended to diagnose infection nor to guide or monitor treatment. Performed at [facility], [place] [address] [place], [place] [address]  Time coordinating discharge in minutes: 65  SIGNED:   Debbe Odea, MD  [facility] 01/22/2019, 11:44 AM Pager   If 7PM-7AM, please contact night-coverage [email or web address] Password [email or web address]

## 2019-01-22 NOTE — Progress Notes (Signed)
Pt given discharge instructions with son and daughter present. Vac switched to prevena with good seal. All belongings gathered to be sent home.

## 2019-01-22 NOTE — Progress Notes (Signed)
[facility] in [place]. Doing well.   From an orthopedic standpoint may DC today with follow up with Dr. Sharol Given next week

## 2019-01-23 LAB — CULTURE, BLOOD (ROUTINE X 2)
Culture: NO GROWTH
Culture: NO GROWTH

## 2019-01-25 ENCOUNTER — Encounter: Payer: Self-pay | Admitting: Internal Medicine

## 2019-01-25 ENCOUNTER — Other Ambulatory Visit: Payer: Self-pay

## 2019-01-25 ENCOUNTER — Ambulatory Visit: Payer: Medicare Other | Admitting: Internal Medicine

## 2019-01-25 VITALS — BP 124/52 | HR 59 | Temp 98.9°F | Ht 71.0 in | Wt 188.0 lb

## 2019-01-25 DIAGNOSIS — I350 Nonrheumatic aortic (valve) stenosis: Secondary | ICD-10-CM | POA: Diagnosis not present

## 2019-01-25 DIAGNOSIS — I5033 Acute on chronic diastolic (congestive) heart failure: Secondary | ICD-10-CM

## 2019-01-25 DIAGNOSIS — I480 Paroxysmal atrial fibrillation: Secondary | ICD-10-CM | POA: Diagnosis not present

## 2019-01-25 DIAGNOSIS — I739 Peripheral vascular disease, unspecified: Secondary | ICD-10-CM

## 2019-01-25 NOTE — Patient Instructions (Signed)
Medication Instructions:  NO CHANGES *If you need a refill on your cardiac medications before your next appointment, please call your pharmacy*  Lab Work: If you have labs (blood work) drawn today and your tests are completely normal, you will receive your results only by: [name] MyChart Message (if you have MyChart) OR . A paper copy in the mail If you have any lab test that is abnormal or we need to change your treatment, we will call you to review the results.  Testing/Procedures: Your physician has requested that you have an echocardiogram. Echocardiography is a painless test that uses sound waves to create images of your heart. It provides your doctor with information about the size and shape of your heart and how well your heart's chambers and valves are working. This procedure takes approximately one hour. There are no restrictions for this procedure.  [place]   Follow-Up: At [facility], you and your health needs are our priority.  As part of our continuing mission to provide you with exceptional heart care, we have created designated Provider Care Teams.  These Care Teams include your primary Cardiologist (physician) and Advanced Practice Providers (APPs -  Physician Assistants and Nurse Practitioners) who all work together to provide you with the care you need, when you need it.  Your next appointment:   7 month(s)  The format for your next appointment:   Either In Person or Virtual  Provider:   Raliegh Ip Mali Hilty, MD

## 2019-01-25 NOTE — Progress Notes (Signed)
OFFICE NOTE  Chief Complaint:  Follow-up  Primary Care Physician: Redmond School, MD  HPI:  [name]  is an 86 year old gentleman with history of hypertension, dyslipidemia and peripheral vascular disease. He did have claudication and in May 2012 he had high-grade mid SFA stenosis. He had a stent placed in his SFA which improved his claudication and his Doppler studies. In December 2012 his left ABI was 0.61, the right ABI 0.74 with a patent stent. He was seen back by Dr. Gwenlyn Found and recommended followup with me in the office today. His only complaints now are about occasional dizziness especially with change in position, also leg pain with walking but not claudication, mostly joint pain, hip pain which could represent arthritis, wearing glasses.   I the pleasure seeing [name] back today. It's been over a year and a half since I saw him last in the office. He reports doing fairly well and denies any chest pain or worsening shortness of breath. He denies any claudication in his legs. Blood pressure is noted to be elevated somewhat today. He is previous film lisinopril and this was stopped for unknown reasons. Not on aspirin but continues to take Plavix for his PAD.  [name] returns today for follow-up. Overall he is doing fairly well though has complaints about particular right hip pain and knee pain. He also reports some swelling in his legs gets worse during the day but improves with elevating them overnight. He says he gets some numbness and tingling in his feet as well. He had arterial Dopplers last year which show stable ABIs and no evidence of stent occlusion. He is due for repeat arterial Dopplers in April of this year. He denies any chest pain or worsening shortness of breath.  06/12/2016  [name] returns today for follow-up. Recently he saw his nephrologist to auscultated a systolic heart murmur. An echocardiogram was ordered at [facility]. This demonstrated an EF  of 60-65%, there was mild to moderate aortic stenosis with a mean gradient of 15 mmHg and calculated valve area of 1.3-1.4 cm. He denies chest pain, worsening shortness of breath, presyncope, syncope or congestive heart failure symptoms. His only other complaint today is bilateral knee pain. He has a history of knee surgery and has also been having some right hip pain. Blood pressure is at goal. He is on atorvastatin 40 mg daily followed by his primary care provider. He has a history of PAD with lower extremity arterial Dopplers in April 2017 that showed no obstructive PAD and a patent stent.  05/09/2017  [name] returns today for follow-up.  He is reporting some mild shortness of breath as well as lower extremity edema.  According to records he is gained 6 pounds since we last saw him.  He reports worsening lower extremity swelling.  He was scheduled to have a routine echocardiogram on May 30 and office visit with me on May 31, however came in earlier because of worsening shortness of breath.  EKG just shows sinus rhythm with first-degree AV block at 63, RBBB-personally reviewed.  His daughter was accompanying him today and noted that he has been out of several of his medications.  Namely he has not taken tamsulosin, finasteride and levothyroxine.  He is also out of his hydrochlorothiazide diuretic.  06/13/2017  [name] returns today for follow-up.  He had an echocardiogram yesterday which showed LVEF of 65-705%, however he now has moderate aortic stenosis, increased from mild stenosis a year  ago.  He reports marked improvement in his edema although weight is about a pound heavier than it was when I last saw him a month ago.  He is on Lasix now.  Creatinine is slightly higher on the Lasix at 1.6, but may be accepted due to his improvement in edema.  08/11/2017  [name] was seen today in follow-up.  Unfortunately was hospitalized in June with chest heaviness.  He was seen by Dr. Harl Bowie in consultation  who felt that his chest pain was atypical for cardiac pain but was noted to be a new onset atrial fibrillation.  Fortunately spontaneously converted back to sinus however was appropriately switched to Eliquis from Plavix.  He denies any recurrent A. fib since then has had no further chest discomfort.  I suspect he felt unwell either due to the A. fib or as a combination of A. fib and moderate aortic stenosis.  He is scheduled ready for a repeat echocardiogram in November and follow-up with me afterwards.  12/09/2017  [name] is seen today for follow-up of her recent echo.  This was a repeat study to assess any changes in aortic stenosis.  His last study was over 6 months ago.  This demonstrates moderate aortic stenosis with a mean gradient of 26 mmHg, from 21 mmHg previously.  He is asymptomatic with this and denies any recurrent atrial fibrillation.  EKG today shows sinus rhythm.  He is compliant with Eliquis and denies any bleeding problems.  Blood pressures been well controlled at home.  01/25/2019  [name] returns today for follow-up.  Unfortunately he has had a very difficult past year.  He has now undergone bilateral BKA's with recurrent issues due to critical limb ischemia.  He is essentially wheelchair-bound although does have a prosthesis on the right side.  He was noted to have moderate aortic stenosis by echo this past August and will need repeat echo in 6 to 8 months.  Other than struggles with his legs and nonhealing wounds, he denies any chest pain or worsening shortness of breath.  PMHx:  Past Medical History:  Diagnosis Date  . Arthritis   . Borderline diabetic   . Diabetes mellitus without complication ([place])   . Glaucoma   . Hyperlipidemia   . Hypertension   . PVD (peripheral vascular disease) ([place])   . Sleep apnea    Cpap ordered but doesnt use  . Urinary retention due to benign prostatic hyperplasia 12/28/2018    Past Surgical History:  Procedure Laterality Date  .  ABDOMINAL AORTOGRAM W/LOWER EXTREMITY N/A 07/27/2018   Procedure: ABDOMINAL AORTOGRAM W/LOWER EXTREMITY;  Surgeon: Lorretta Harp, MD;  Location: [place] [facility];  Service: Cardiovascular;  Laterality: N/A;  . AMPUTATION Right 08/28/2018   Procedure: RIGHT BELOW KNEE AMPUTATION;  Surgeon: [name], MD;  Location: [place];  Service: Orthopedics;  Laterality: Right;  . AMPUTATION Left 12/02/2018   Procedure: LEFT TRANSMETATARSAL AMPUTATION AND NEGATIVE PRESSURE WOUND VAC PLACEMENT;  Surgeon: [name], MD;  Location: [place];  Service: Orthopedics;  Laterality: Left;  . AMPUTATION Left 12/18/2018   Procedure: LEFT BELOW KNEE AMPUTATION;  Surgeon: [name], MD;  Location: [place];  Service: Orthopedics;  Laterality: Left;  . APPENDECTOMY  1943  . BELOW KNEE LEG AMPUTATION Left 12/18/2018  . CATARACT EXTRACTION  2012   x2  . COLONOSCOPY N/A 11/01/2014   Procedure: COLONOSCOPY;  Surgeon: Aviva Signs, MD;  [facility];  Service: Gastroenterology;  Laterality: N/A;  . ESOPHAGOGASTRODUODENOSCOPY N/A 11/01/2014   Procedure: ESOPHAGOGASTRODUODENOSCOPY (EGD);  Surgeon: Aviva Signs, MD;  [facility];  Service: Gastroenterology;  Laterality: N/A;  . HERNIA REPAIR Left   . INGUINAL HERNIA REPAIR Right 03/01/2013   Procedure: RIGHT INGUINAL HERNIORRHAPHY;  Surgeon: Jamesetta So, MD;  [facility];  Service: General;  Laterality: Right;  . INSERTION OF MESH Right 03/01/2013   Procedure: INSERTION OF MESH;  Surgeon: Jamesetta So, MD;  [facility];  Service: General;  Laterality: Right;  . LOWER EXTREMITY ANGIOGRAPHY Right 08/25/2018   Procedure: LOWER EXTREMITY ANGIOGRAPHY;  Surgeon: Wellington Hampshire, MD;  Location: [place] [facility];  Service: Cardiovascular;  Laterality: Right;  . LOWER EXTREMITY ANGIOGRAPHY N/A 11/25/2018   Procedure: LOWER EXTREMITY ANGIOGRAPHY;  Surgeon: Wellington Hampshire, MD;  Location: [place] [facility];  Service: Cardiovascular;   Laterality: N/A;  . NM MYOCAR PERF WALL MOTION  06/05/2010   no significant ischemia  . PERIPHERAL VASCULAR ATHERECTOMY Right 08/03/2018   Procedure: PERIPHERAL VASCULAR ATHERECTOMY;  Surgeon: Lorretta Harp, MD;  Location: [place] [facility];  Service: Cardiovascular;  Laterality: Right;  . PERIPHERAL VASCULAR ATHERECTOMY Left 11/23/2018   Procedure: PERIPHERAL VASCULAR ATHERECTOMY;  Surgeon: Lorretta Harp, MD;  Location: [place] [facility];  Service: Cardiovascular;  Laterality: Left;  sfa with dc balloon  . PERIPHERAL VASCULAR BALLOON ANGIOPLASTY Left 11/25/2018   Procedure: PERIPHERAL VASCULAR BALLOON ANGIOPLASTY;  Surgeon: Wellington Hampshire, MD;  Location: [place] [facility];  Service: Cardiovascular;  Laterality: Left;  popliteal  . PERIPHERAL VASCULAR INTERVENTION Left 11/25/2018   Procedure: PERIPHERAL VASCULAR INTERVENTION;  Surgeon: Wellington Hampshire, MD;  Location: [place] [facility];  Service: Cardiovascular;  Laterality: Left;  tibial peroneal trunk and peroneal stents   . stent  06/14/2010   left leg  . STUMP REVISION Left 01/20/2019   Procedure: REVISION LEFT BELOW KNEE AMPUTATION;  Surgeon: [name], MD;  Location: [place];  Service: Orthopedics;  Laterality: Left;  . US ECHOCARDIOGRAPHY  01/21/2006   moderate mitral annular ca+, mild MR, AOV moderately sclerotic.    FAMHx:  Family History  Problem Relation Age of Onset  . Cancer Mother   . Heart attack Father   . Stroke Father   . Heart attack Brother   . Heart attack Brother     SOCHx:   reports that he quit smoking about 54 years ago. He has quit using smokeless tobacco.  His smokeless tobacco use included chew. He reports that he does not drink alcohol or use drugs.  ALLERGIES:  Allergies  Allergen Reactions  . Iodinated Diagnostic Agents     Other reaction(s): Fever  . Dye Fdc Red [Red Dye] Hives    Tolerated red Docusate, Niferex  . Iodine-131 Other (See Comments)    Breakout, fever  . Iohexol       Code: RASH, Desc: PT STATES HOT ALL OVER HAD TO HAVE ICE PACKS ALL OVER HIM AND DIFF BREATHING, PT REFUSED IV DYE     ROS: Pertinent items noted in HPI and remainder of comprehensive ROS otherwise negative.  HOME MEDS: Current Outpatient Medications  Medication Sig Dispense Refill  . acetaminophen (TYLENOL) 500 MG tablet Take 1 tablet (500 mg total) by mouth 3 (three) times daily. (Patient taking differently: Take 500 mg by mouth every 8 (eight) hours as needed for moderate pain. ) 30 tablet 0  . apixaban (ELIQUIS) 5 MG TABS tablet Take  5 mg by mouth 2 (two) times daily.    [name] aspirin EC 81 MG tablet Take 81 mg by mouth daily.    [name] atorvastatin (LIPITOR) 40 MG tablet Take 1 tablet (40 mg total) by mouth daily. 90 tablet 3  . clopidogrel (PLAVIX) 75 MG tablet Take 1 tablet (75 mg total) by mouth daily with breakfast.    . diclofenac Sodium (VOLTAREN) 1 % GEL Apply 2 g topically 4 (four) times daily. (Patient taking differently: Apply 2 g topically 2 (two) times daily as needed (pain). ) 350 g 0  . docusate sodium (COLACE) 100 MG capsule Take 1 capsule (100 mg total) by mouth 2 (two) times daily. (Patient taking differently: Take 100 mg by mouth every evening. ) 60 capsule 0  . dorzolamide-timolol (COSOPT) 22.3-6.8 MG/ML ophthalmic solution Place 1 drop into both eyes 2 (two) times daily.    . famotidine (PEPCID) 20 MG tablet Take 1 tablet (20 mg total) by mouth daily. 60 tablet 0  . ferrous sulfate 325 (65 FE) MG tablet Take 325 mg by mouth 2 (two) times daily with a meal.    . finasteride (PROSCAR) 5 MG tablet Take 1 tablet (5 mg total) by mouth every evening. 30 tablet 0  . furosemide (LASIX) 40 MG tablet Take 1 tablet (40 mg total) by mouth daily. 30 tablet 1  . glipiZIDE (GLUCOTROL) 5 MG tablet Take 5 mg by mouth 2 (two) times daily.     [name] levothyroxine (SYNTHROID) 50 MCG tablet Take 1 tablet (50 mcg total) by mouth daily before breakfast. 30 tablet 0  . methocarbamol (ROBAXIN) 500 MG  tablet Take 1 tablet (500 mg total) by mouth every 6 (six) hours as needed for muscle spasms. 30 tablet 0  . Misc Natural Products (GLUCOSAMINE CHOND MSM FORMULA PO) Take 1 tablet by mouth 2 (two) times daily.    . nutrition supplement, JUVEN, (JUVEN) PACK Take 1 packet by mouth 2 (two) times daily between meals. 20 packet 0  . Omega-3 Fatty Acids (FISH OIL PO) Take 1 capsule by mouth daily.    [name] oxyCODONE-acetaminophen (PERCOCET) 5-325 MG tablet Take 1 tablet by mouth every 4 (four) hours as needed for severe pain. 30 tablet 0  . polyethylene glycol (MIRALAX) 17 g packet Take 17 g by mouth daily as needed for moderate constipation. 14 each 0  . potassium chloride (KLOR-CON) 10 MEQ tablet Take 1 tablet (10 mEq total) by mouth daily. 30 tablet 1  . pyridoxine (B-6) 100 MG tablet Take 100 mg by mouth daily.    [name] senna-docusate (SENOKOT-S) 8.6-50 MG tablet Take 2 tablets by mouth at bedtime.    . tamsulosin (FLOMAX) 0.4 MG CAPS capsule Take 1 capsule (0.4 mg total) by mouth 2 (two) times daily. 60 capsule 0  . traMADol (ULTRAM) 50 MG tablet Take 1 tablet (50 mg total) by mouth every 6 (six) hours as needed for severe pain. 24 tablet 0  . vitamin C (ASCORBIC ACID) 500 MG tablet Take 500 mg by mouth daily.    . vitamin E 400 UNIT capsule Take 400 Units by mouth daily.     No current facility-administered medications for this visit.    LABS/IMAGING: No results found for this or any previous visit (from the past 48 hour(s)). No results found.  VITALS: BP (!) 124/52 (BP Location: Left Arm, Patient Position: Sitting, Cuff Size: Normal)   Pulse (!) 59   Temp 98.9 F (37.2 C)   Ht 5\' 11"  (  1.803 m)   Wt 188 lb (85.3 kg)   BMI 26.22 kg/m   EXAM: General appearance: alert and no distress Neck: no carotid bruit and no JVD Lungs: clear to auscultation bilaterally Heart: regular rate and rhythm, S1, S2 normal and systolic murmur: systolic ejection 3/6, crescendo at 2nd right intercostal  space Abdomen: soft, non-tender; bowel sounds normal; no masses,  no organomegaly Extremities: Bilateral BKA Pulses: Normal upper extremity pulses Skin: Skin color, texture, turgor normal. No rashes or lesions Neurologic: Grossly normal  EKG: Deferred  ASSESSMENT: 1. Recent PAF - CHADSVASC score of 5 (on Eliquis) 2. Acute on chronic diastolic congestive heart failure-secondary to medication noncompliance 3. Moderate aortic stenosis 4. New RBBB/bifascicular block 5. PAD status post Bilateral BKA 6. Hypertension- controlled 7. Dizziness 8. Dyslipidemia 9. Asymptomatic bradycardia   PLAN: 1.   [name] unfortunately underwent bilateral BKA for severe PAD/critical limb ischemia.  He has moderate aortic stenosis which will need to continue to follow.  There is no evidence for A. fib today.  He remains on Eliquis.  There is no evidence for any heart failure symptoms.  He has a newly established dry weight.  Apparently was also recently hospitalized this past year for acute on chronic heart failure.  Follow-up with me in 6 months.  Pixie Casino, MD, [facility], [place] Director of the [facility] Diplomate of the American Board of Clinical Lipidology Attending Cardiologist  Direct Dial: [phone]  Fax: [phone]  Website:  [email or web address] 01/25/2019, 2:38 PM

## 2019-01-26 ENCOUNTER — Telehealth: Payer: Self-pay

## 2019-01-26 DIAGNOSIS — R338 Other retention of urine: Secondary | ICD-10-CM | POA: Diagnosis not present

## 2019-01-26 DIAGNOSIS — I130 Hypertensive heart and chronic kidney disease with heart failure and stage 1 through stage 4 chronic kidney disease, or unspecified chronic kidney disease: Secondary | ICD-10-CM | POA: Diagnosis not present

## 2019-01-26 DIAGNOSIS — E1151 Type 2 diabetes mellitus with diabetic peripheral angiopathy without gangrene: Secondary | ICD-10-CM | POA: Diagnosis not present

## 2019-01-26 DIAGNOSIS — I5032 Chronic diastolic (congestive) heart failure: Secondary | ICD-10-CM | POA: Diagnosis not present

## 2019-01-26 DIAGNOSIS — E785 Hyperlipidemia, unspecified: Secondary | ICD-10-CM | POA: Diagnosis not present

## 2019-01-26 DIAGNOSIS — E1122 Type 2 diabetes mellitus with diabetic chronic kidney disease: Secondary | ICD-10-CM | POA: Diagnosis not present

## 2019-01-26 DIAGNOSIS — Z89512 Acquired absence of left leg below knee: Secondary | ICD-10-CM | POA: Diagnosis not present

## 2019-01-26 DIAGNOSIS — I480 Paroxysmal atrial fibrillation: Secondary | ICD-10-CM | POA: Diagnosis not present

## 2019-01-26 DIAGNOSIS — M1990 Unspecified osteoarthritis, unspecified site: Secondary | ICD-10-CM | POA: Diagnosis not present

## 2019-01-26 DIAGNOSIS — T8781 Dehiscence of amputation stump: Secondary | ICD-10-CM | POA: Diagnosis not present

## 2019-01-26 DIAGNOSIS — N183 Chronic kidney disease, stage 3 unspecified: Secondary | ICD-10-CM | POA: Diagnosis not present

## 2019-01-26 DIAGNOSIS — H409 Unspecified glaucoma: Secondary | ICD-10-CM | POA: Diagnosis not present

## 2019-01-26 NOTE — Telephone Encounter (Signed)
Kat, [place] with [facility] would like verbal orders for 1 x week for 5 weeks.  Cb# [phone]. If no answer, please leave a VM.  Please advise.  Thank you.

## 2019-01-26 NOTE — Telephone Encounter (Signed)
[name] was called and given verbal okay for orders.

## 2019-01-27 ENCOUNTER — Encounter: Payer: Self-pay | Admitting: Family

## 2019-01-27 ENCOUNTER — Ambulatory Visit (INDEPENDENT_AMBULATORY_CARE_PROVIDER_SITE_OTHER): Payer: Medicare Other | Admitting: Family

## 2019-01-27 ENCOUNTER — Other Ambulatory Visit: Payer: Self-pay

## 2019-01-27 VITALS — Ht 71.0 in | Wt 188.0 lb

## 2019-01-27 DIAGNOSIS — Z89512 Acquired absence of left leg below knee: Secondary | ICD-10-CM

## 2019-01-27 DIAGNOSIS — Z89511 Acquired absence of right leg below knee: Secondary | ICD-10-CM

## 2019-01-27 NOTE — Progress Notes (Signed)
Post-Op Visit Note   Patient: [name]           Date of Birth: 23-Jan-1927           MRN: [id number] Visit Date: 01/27/2019 PCP: Redmond School, MD  Chief Complaint:  Chief Complaint  Patient presents with  . Left Leg - Routine Post Op    01/20/19 left revision BKA     HPI:  HPI The patient is an 86 year old gentleman seen today 1 week status post revision left below-knee amputation Ortho Exam Incision well approximated sutures and staples there is no active drainage there is very minimal swelling no erythema no sign of infection  Visit Diagnoses:  1. S/P bilateral BKA (below knee amputation) ([place])     Plan: Begin daily Dial soap cleansing.  Dry dressing changes.  He will wear his shrinker over this.  We will follow-up in 2 weeks for suture removal  Follow-Up Instructions: Return in about 2 weeks (around 02/10/2019).   Imaging: No results found.  Orders:  No orders of the defined types were placed in this encounter.  No orders of the defined types were placed in this encounter.    PMFS History: Patient Active Problem List   Diagnosis Date Noted  . Chronic indwelling Foley catheter 01/22/2019  . Postoperative wound infection 01/20/2019  . Cellulitis 01/18/2019  . Infection of deep incisional surgical site after procedure   . Abnormal urinalysis   . Labile blood glucose   . Urinary retention   . S/P bilateral BKA (below knee amputation) ([place])   . Acute blood loss anemia   . Urinary retention due to benign prostatic hyperplasia 12/28/2018  . Unilateral complete BKA, left, initial encounter ([place]) 12/25/2018  . Dehiscence of amputation stump ([place])   . History of transmetatarsal amputation of left foot ([place]) 12/09/2018  . Critical limb ischemia with history of revascularization of same extremity   . Gangrene of left foot ([place])   . Diabetic ulcer of toe of left foot associated with type 2 diabetes mellitus ([place])   . Cellulitis of left lower extremity   . Toe  infection 11/18/2018  . Peripheral edema   . Acute on chronic diastolic CHF (congestive heart failure) ([place]) 11/09/2018  . S/P BKA (below knee amputation) unilateral, right ([place]) 10/05/2018  . Hyponatremia   . Essential hypertension   . Postoperative pain   . Pain of left heel   . Unable to maintain weight-bearing   . Type 2 diabetes mellitus with diabetic peripheral angiopathy with gangrene ([place])   . Anemia due to acute blood loss   . Chronic kidney disease, stage 3a   . Acquired absence of right leg below knee ([place]) 09/07/2018  . Gangrene of right foot ([place])   . DNR (do not resuscitate) discussion   . Goals of care, counseling/discussion   . Palliative care by specialist   . Type 2 diabetes mellitus with vascular disease ([place])   . Severe protein-calorie malnutrition ([place])   . Ischemic foot 08/20/2018  . Critical lower limb ischemia 08/03/2018  . Type 2 diabetes mellitus with foot ulcer, without long-term current use of insulin ([place])   . Gastroesophageal reflux disease   . Cellulitis of great toe of right foot 05/29/2018  . Atrial fibrillation, chronic   . Chest pain 07/25/2017  . PAF (paroxysmal atrial fibrillation) ([place]) 07/25/2017  . BPH (benign prostatic hyperplasia) 07/25/2017  . Shortness of breath 05/11/2017  . Hypothyroidism 05/11/2017  . Chronic diastolic CHF (congestive  heart failure) ([place]) 05/11/2017  . Moderate aortic stenosis 06/12/2016  . RBBB 06/12/2016  . Peripheral vascular disease ([place]) 08/04/2012  . Essential hypertension, benign 08/04/2012  . Hyperlipidemia 08/04/2012  . Dizziness 08/04/2012   Past Medical History:  Diagnosis Date  . Arthritis   . Borderline diabetic   . Diabetes mellitus without complication ([place])   . Glaucoma   . Hyperlipidemia   . Hypertension   . PVD (peripheral vascular disease) ([place])   . Sleep apnea    Cpap ordered but doesnt use  . Urinary retention due to benign prostatic hyperplasia 12/28/2018    Family History    Problem Relation Age of Onset  . Cancer Mother   . Heart attack Father   . Stroke Father   . Heart attack Brother   . Heart attack Brother     Past Surgical History:  Procedure Laterality Date  . ABDOMINAL AORTOGRAM W/LOWER EXTREMITY N/A 07/27/2018   Procedure: ABDOMINAL AORTOGRAM W/LOWER EXTREMITY;  Surgeon: Lorretta Harp, MD;  Location: [place] [facility];  Service: Cardiovascular;  Laterality: N/A;  . AMPUTATION Right 08/28/2018   Procedure: RIGHT BELOW KNEE AMPUTATION;  Surgeon: [name], MD;  Location: [place];  Service: Orthopedics;  Laterality: Right;  . AMPUTATION Left 12/02/2018   Procedure: LEFT TRANSMETATARSAL AMPUTATION AND NEGATIVE PRESSURE WOUND VAC PLACEMENT;  Surgeon: [name], MD;  Location: [place];  Service: Orthopedics;  Laterality: Left;  . AMPUTATION Left 12/18/2018   Procedure: LEFT BELOW KNEE AMPUTATION;  Surgeon: [name], MD;  Location: [place];  Service: Orthopedics;  Laterality: Left;  . APPENDECTOMY  1943  . BELOW KNEE LEG AMPUTATION Left 12/18/2018  . CATARACT EXTRACTION  2012   x2  . COLONOSCOPY N/A 11/01/2014   Procedure: COLONOSCOPY;  Surgeon: Aviva Signs, MD;  [facility];  Service: Gastroenterology;  Laterality: N/A;  . ESOPHAGOGASTRODUODENOSCOPY N/A 11/01/2014   Procedure: ESOPHAGOGASTRODUODENOSCOPY (EGD);  Surgeon: Aviva Signs, MD;  [facility];  Service: Gastroenterology;  Laterality: N/A;  . HERNIA REPAIR Left   . INGUINAL HERNIA REPAIR Right 03/01/2013   Procedure: RIGHT INGUINAL HERNIORRHAPHY;  Surgeon: Jamesetta So, MD;  [facility];  Service: General;  Laterality: Right;  . INSERTION OF MESH Right 03/01/2013   Procedure: INSERTION OF MESH;  Surgeon: Jamesetta So, MD;  [facility];  Service: General;  Laterality: Right;  . LOWER EXTREMITY ANGIOGRAPHY Right 08/25/2018   Procedure: LOWER EXTREMITY ANGIOGRAPHY;  Surgeon: Wellington Hampshire, MD;  Location: [place] [facility];  Service:  Cardiovascular;  Laterality: Right;  . LOWER EXTREMITY ANGIOGRAPHY N/A 11/25/2018   Procedure: LOWER EXTREMITY ANGIOGRAPHY;  Surgeon: Wellington Hampshire, MD;  Location: [place] [facility];  Service: Cardiovascular;  Laterality: N/A;  . NM MYOCAR PERF WALL MOTION  06/05/2010   no significant ischemia  . PERIPHERAL VASCULAR ATHERECTOMY Right 08/03/2018   Procedure: PERIPHERAL VASCULAR ATHERECTOMY;  Surgeon: Lorretta Harp, MD;  Location: [place] [facility];  Service: Cardiovascular;  Laterality: Right;  . PERIPHERAL VASCULAR ATHERECTOMY Left 11/23/2018   Procedure: PERIPHERAL VASCULAR ATHERECTOMY;  Surgeon: Lorretta Harp, MD;  Location: [place] [facility];  Service: Cardiovascular;  Laterality: Left;  sfa with dc balloon  . PERIPHERAL VASCULAR BALLOON ANGIOPLASTY Left 11/25/2018   Procedure: PERIPHERAL VASCULAR BALLOON ANGIOPLASTY;  Surgeon: Wellington Hampshire, MD;  Location: [place] [facility];  Service: Cardiovascular;  Laterality: Left;  popliteal  . PERIPHERAL VASCULAR INTERVENTION Left 11/25/2018  Procedure: PERIPHERAL VASCULAR INTERVENTION;  Surgeon: Wellington Hampshire, MD;  Location: [place] [facility];  Service: Cardiovascular;  Laterality: Left;  tibial peroneal trunk and peroneal stents   . stent  06/14/2010   left leg  . STUMP REVISION Left 01/20/2019   Procedure: REVISION LEFT BELOW KNEE AMPUTATION;  Surgeon: [name], MD;  Location: [place];  Service: Orthopedics;  Laterality: Left;  . US ECHOCARDIOGRAPHY  01/21/2006   moderate mitral annular ca+, mild MR, AOV moderately sclerotic.   Social History   Occupational History  . Not on file  Tobacco Use  . Smoking status: Former Smoker    Quit date: 01/13/1965    Years since quitting: 54.0  . Smokeless tobacco: Former Systems developer    Types: Chew  Substance and Sexual Activity  . Alcohol use: No  . Drug use: No  . Sexual activity: Not Currently

## 2019-01-28 ENCOUNTER — Telehealth: Payer: Self-pay | Admitting: Orthopedic Surgery

## 2019-01-28 ENCOUNTER — Encounter: Payer: Self-pay | Admitting: *Deleted

## 2019-01-28 ENCOUNTER — Other Ambulatory Visit: Payer: Self-pay | Admitting: *Deleted

## 2019-01-28 DIAGNOSIS — T8781 Dehiscence of amputation stump: Secondary | ICD-10-CM | POA: Diagnosis not present

## 2019-01-28 DIAGNOSIS — M1990 Unspecified osteoarthritis, unspecified site: Secondary | ICD-10-CM | POA: Diagnosis not present

## 2019-01-28 DIAGNOSIS — I5032 Chronic diastolic (congestive) heart failure: Secondary | ICD-10-CM | POA: Diagnosis not present

## 2019-01-28 DIAGNOSIS — N183 Chronic kidney disease, stage 3 unspecified: Secondary | ICD-10-CM | POA: Diagnosis not present

## 2019-01-28 DIAGNOSIS — Z89512 Acquired absence of left leg below knee: Secondary | ICD-10-CM | POA: Diagnosis not present

## 2019-01-28 DIAGNOSIS — E1122 Type 2 diabetes mellitus with diabetic chronic kidney disease: Secondary | ICD-10-CM | POA: Diagnosis not present

## 2019-01-28 DIAGNOSIS — R338 Other retention of urine: Secondary | ICD-10-CM | POA: Diagnosis not present

## 2019-01-28 DIAGNOSIS — I130 Hypertensive heart and chronic kidney disease with heart failure and stage 1 through stage 4 chronic kidney disease, or unspecified chronic kidney disease: Secondary | ICD-10-CM | POA: Diagnosis not present

## 2019-01-28 DIAGNOSIS — H409 Unspecified glaucoma: Secondary | ICD-10-CM | POA: Diagnosis not present

## 2019-01-28 DIAGNOSIS — I480 Paroxysmal atrial fibrillation: Secondary | ICD-10-CM | POA: Diagnosis not present

## 2019-01-28 DIAGNOSIS — E1151 Type 2 diabetes mellitus with diabetic peripheral angiopathy without gangrene: Secondary | ICD-10-CM | POA: Diagnosis not present

## 2019-01-28 DIAGNOSIS — E785 Hyperlipidemia, unspecified: Secondary | ICD-10-CM | POA: Diagnosis not present

## 2019-01-28 NOTE — Telephone Encounter (Signed)
[name].   She needs resumption of care orders. 2xwk for 1 wk and 1xwk for 6wks   She also needs to know what kind of wound care orders he has.   Call back number: [phone]

## 2019-01-28 NOTE — Telephone Encounter (Signed)
Called and sw Clarise Cruz HHN to advise that the pt is to keep area clean with dial soap and water and to apply a dry dressing to the limb with shrinker on top. Will come back in for office visit and possible suture removal. To call with any questions.

## 2019-01-28 NOTE — Patient Outreach (Signed)
Transition of care call.  [name] returned home last Friday 01/22/19 after a revision of his most recent BKA. He is attended will by his children and is receivnig home health, PT, OT and nursing.  [name] his son provided information to me today he is one of the HCPOA's. He reports his father's wound is doing well. He had a surgical recheck yesterday and no problems were identified.   Transition of care template completed. WIll continue care plan established in Dec 2020.  [facility] CM Care Plan Problem One     Most Recent Value  Care Plan Problem One  Diabetes with PVD, recent bilat BKAs.  (Pended)   Role Documenting the Problem One  Care Management Coordinator  (Pended)   [place] for Problem One  Active  (Pended)   [facility] Long Term Goal   Pt will not have complications to his surgical sites due to uncontolled glucose levels over the next 60 days as observed by family and glucose record.  (Pended)   [facility] Long Term Goal Start Date  01/12/19  (Pended)     [facility] CM Care Plan Problem Two     Most Recent Value  Care Plan Problem Two  Reduced mobility and independence due to BKAs  (Pended)   Role Documenting the Problem Two  Care Management Coordinator  (Pended)   [place] for Problem Two  Active  (Pended)   [facility] Long Term Goal  Pt will not sustain any injury requiring ED visit due to fall or other safety issue over the next 60 days.  (Pended)   [facility] Long Term Goal Start Date  01/12/19  (Pended)   [facility] CM Short Term Goal #1   Pt will participate in PT over the next 30 days.  (Pended)   [facility] CM Short Term Goal #1 Start Date  01/12/19  (Pended)        I will call again in one week.  Eulah Pont. Myrtie Neither, MSN, [facility] Gerontological Nurse Practitioner [facility] Care Management [phone]

## 2019-02-01 DIAGNOSIS — E785 Hyperlipidemia, unspecified: Secondary | ICD-10-CM | POA: Diagnosis not present

## 2019-02-01 DIAGNOSIS — Z89512 Acquired absence of left leg below knee: Secondary | ICD-10-CM | POA: Diagnosis not present

## 2019-02-01 DIAGNOSIS — M1990 Unspecified osteoarthritis, unspecified site: Secondary | ICD-10-CM | POA: Diagnosis not present

## 2019-02-01 DIAGNOSIS — I480 Paroxysmal atrial fibrillation: Secondary | ICD-10-CM | POA: Diagnosis not present

## 2019-02-01 DIAGNOSIS — I130 Hypertensive heart and chronic kidney disease with heart failure and stage 1 through stage 4 chronic kidney disease, or unspecified chronic kidney disease: Secondary | ICD-10-CM | POA: Diagnosis not present

## 2019-02-01 DIAGNOSIS — E1151 Type 2 diabetes mellitus with diabetic peripheral angiopathy without gangrene: Secondary | ICD-10-CM | POA: Diagnosis not present

## 2019-02-01 DIAGNOSIS — R338 Other retention of urine: Secondary | ICD-10-CM | POA: Diagnosis not present

## 2019-02-01 DIAGNOSIS — E1122 Type 2 diabetes mellitus with diabetic chronic kidney disease: Secondary | ICD-10-CM | POA: Diagnosis not present

## 2019-02-01 DIAGNOSIS — T8781 Dehiscence of amputation stump: Secondary | ICD-10-CM | POA: Diagnosis not present

## 2019-02-01 DIAGNOSIS — N183 Chronic kidney disease, stage 3 unspecified: Secondary | ICD-10-CM | POA: Diagnosis not present

## 2019-02-01 DIAGNOSIS — H409 Unspecified glaucoma: Secondary | ICD-10-CM | POA: Diagnosis not present

## 2019-02-01 DIAGNOSIS — I5032 Chronic diastolic (congestive) heart failure: Secondary | ICD-10-CM | POA: Diagnosis not present

## 2019-02-02 ENCOUNTER — Other Ambulatory Visit: Payer: Self-pay

## 2019-02-02 ENCOUNTER — Observation Stay (HOSPITAL_COMMUNITY): Payer: Medicare Other

## 2019-02-02 ENCOUNTER — Encounter (HOSPITAL_COMMUNITY): Payer: Self-pay

## 2019-02-02 ENCOUNTER — Emergency Department (HOSPITAL_COMMUNITY): Payer: Medicare Other

## 2019-02-02 ENCOUNTER — Inpatient Hospital Stay (HOSPITAL_COMMUNITY)
Admission: EM | Admit: 2019-02-02 | Discharge: 2019-02-04 | DRG: 069 | Disposition: A | Discharge status: Home Health | Payer: Medicare Other | Attending: Family Medicine | Admitting: Family Medicine

## 2019-02-02 DIAGNOSIS — Z66 Do not resuscitate: Secondary | ICD-10-CM | POA: Diagnosis not present

## 2019-02-02 DIAGNOSIS — G4730 Sleep apnea, unspecified: Secondary | ICD-10-CM | POA: Diagnosis present

## 2019-02-02 DIAGNOSIS — I6203 Nontraumatic chronic subdural hemorrhage: Secondary | ICD-10-CM | POA: Diagnosis present

## 2019-02-02 DIAGNOSIS — I350 Nonrheumatic aortic (valve) stenosis: Secondary | ICD-10-CM | POA: Diagnosis not present

## 2019-02-02 DIAGNOSIS — I10 Essential (primary) hypertension: Secondary | ICD-10-CM | POA: Diagnosis not present

## 2019-02-02 DIAGNOSIS — E1151 Type 2 diabetes mellitus with diabetic peripheral angiopathy without gangrene: Secondary | ICD-10-CM | POA: Diagnosis not present

## 2019-02-02 DIAGNOSIS — R404 Transient alteration of awareness: Secondary | ICD-10-CM | POA: Diagnosis not present

## 2019-02-02 DIAGNOSIS — R338 Other retention of urine: Secondary | ICD-10-CM | POA: Diagnosis not present

## 2019-02-02 DIAGNOSIS — N1831 Chronic kidney disease, stage 3a: Secondary | ICD-10-CM | POA: Diagnosis not present

## 2019-02-02 DIAGNOSIS — E871 Hypo-osmolality and hyponatremia: Secondary | ICD-10-CM | POA: Diagnosis not present

## 2019-02-02 DIAGNOSIS — I361 Nonrheumatic tricuspid (valve) insufficiency: Secondary | ICD-10-CM | POA: Diagnosis not present

## 2019-02-02 DIAGNOSIS — N401 Enlarged prostate with lower urinary tract symptoms: Secondary | ICD-10-CM | POA: Diagnosis present

## 2019-02-02 DIAGNOSIS — M1990 Unspecified osteoarthritis, unspecified site: Secondary | ICD-10-CM | POA: Diagnosis not present

## 2019-02-02 DIAGNOSIS — K219 Gastro-esophageal reflux disease without esophagitis: Secondary | ICD-10-CM | POA: Diagnosis not present

## 2019-02-02 DIAGNOSIS — Z89512 Acquired absence of left leg below knee: Secondary | ICD-10-CM | POA: Diagnosis not present

## 2019-02-02 DIAGNOSIS — G459 Transient cerebral ischemic attack, unspecified: Secondary | ICD-10-CM | POA: Diagnosis not present

## 2019-02-02 DIAGNOSIS — L89311 Pressure ulcer of right buttock, stage 1: Secondary | ICD-10-CM | POA: Diagnosis present

## 2019-02-02 DIAGNOSIS — Z9102 Food additives allergy status: Secondary | ICD-10-CM

## 2019-02-02 DIAGNOSIS — Z743 Need for continuous supervision: Secondary | ICD-10-CM | POA: Diagnosis not present

## 2019-02-02 DIAGNOSIS — H409 Unspecified glaucoma: Secondary | ICD-10-CM | POA: Diagnosis present

## 2019-02-02 DIAGNOSIS — D649 Anemia, unspecified: Secondary | ICD-10-CM | POA: Diagnosis not present

## 2019-02-02 DIAGNOSIS — I440 Atrioventricular block, first degree: Secondary | ICD-10-CM | POA: Diagnosis present

## 2019-02-02 DIAGNOSIS — M40209 Unspecified kyphosis, site unspecified: Secondary | ICD-10-CM | POA: Diagnosis present

## 2019-02-02 DIAGNOSIS — Z7901 Long term (current) use of anticoagulants: Secondary | ICD-10-CM

## 2019-02-02 DIAGNOSIS — Z823 Family history of stroke: Secondary | ICD-10-CM

## 2019-02-02 DIAGNOSIS — R479 Unspecified speech disturbances: Secondary | ICD-10-CM | POA: Diagnosis present

## 2019-02-02 DIAGNOSIS — I4510 Unspecified right bundle-branch block: Secondary | ICD-10-CM | POA: Diagnosis not present

## 2019-02-02 DIAGNOSIS — E039 Hypothyroidism, unspecified: Secondary | ICD-10-CM | POA: Diagnosis present

## 2019-02-02 DIAGNOSIS — G9340 Encephalopathy, unspecified: Secondary | ICD-10-CM | POA: Diagnosis not present

## 2019-02-02 DIAGNOSIS — Z79890 Hormone replacement therapy (postmenopausal): Secondary | ICD-10-CM

## 2019-02-02 DIAGNOSIS — Z20822 Contact with and (suspected) exposure to covid-19: Secondary | ICD-10-CM | POA: Diagnosis not present

## 2019-02-02 DIAGNOSIS — L89322 Pressure ulcer of left buttock, stage 2: Secondary | ICD-10-CM | POA: Diagnosis present

## 2019-02-02 DIAGNOSIS — I130 Hypertensive heart and chronic kidney disease with heart failure and stage 1 through stage 4 chronic kidney disease, or unspecified chronic kidney disease: Secondary | ICD-10-CM | POA: Diagnosis not present

## 2019-02-02 DIAGNOSIS — E1159 Type 2 diabetes mellitus with other circulatory complications: Secondary | ICD-10-CM | POA: Diagnosis present

## 2019-02-02 DIAGNOSIS — D631 Anemia in chronic kidney disease: Secondary | ICD-10-CM | POA: Diagnosis present

## 2019-02-02 DIAGNOSIS — E1122 Type 2 diabetes mellitus with diabetic chronic kidney disease: Secondary | ICD-10-CM | POA: Diagnosis not present

## 2019-02-02 DIAGNOSIS — Z91041 Radiographic dye allergy status: Secondary | ICD-10-CM

## 2019-02-02 DIAGNOSIS — I5032 Chronic diastolic (congestive) heart failure: Secondary | ICD-10-CM | POA: Diagnosis not present

## 2019-02-02 DIAGNOSIS — Z87891 Personal history of nicotine dependence: Secondary | ICD-10-CM

## 2019-02-02 DIAGNOSIS — E861 Hypovolemia: Secondary | ICD-10-CM | POA: Diagnosis present

## 2019-02-02 DIAGNOSIS — I480 Paroxysmal atrial fibrillation: Secondary | ICD-10-CM | POA: Diagnosis not present

## 2019-02-02 DIAGNOSIS — L8990 Pressure ulcer of unspecified site, unspecified stage: Secondary | ICD-10-CM | POA: Insufficient documentation

## 2019-02-02 DIAGNOSIS — T8781 Dehiscence of amputation stump: Secondary | ICD-10-CM | POA: Diagnosis not present

## 2019-02-02 DIAGNOSIS — Z89511 Acquired absence of right leg below knee: Secondary | ICD-10-CM

## 2019-02-02 DIAGNOSIS — R4701 Aphasia: Secondary | ICD-10-CM | POA: Diagnosis present

## 2019-02-02 DIAGNOSIS — E785 Hyperlipidemia, unspecified: Secondary | ICD-10-CM | POA: Diagnosis not present

## 2019-02-02 DIAGNOSIS — R29818 Other symptoms and signs involving the nervous system: Secondary | ICD-10-CM | POA: Diagnosis not present

## 2019-02-02 DIAGNOSIS — R471 Dysarthria and anarthria: Secondary | ICD-10-CM | POA: Diagnosis present

## 2019-02-02 DIAGNOSIS — Z7984 Long term (current) use of oral hypoglycemic drugs: Secondary | ICD-10-CM

## 2019-02-02 DIAGNOSIS — N183 Chronic kidney disease, stage 3 unspecified: Secondary | ICD-10-CM | POA: Diagnosis not present

## 2019-02-02 DIAGNOSIS — Z809 Family history of malignant neoplasm, unspecified: Secondary | ICD-10-CM

## 2019-02-02 DIAGNOSIS — T502X5A Adverse effect of carbonic-anhydrase inhibitors, benzothiadiazides and other diuretics, initial encounter: Secondary | ICD-10-CM | POA: Diagnosis present

## 2019-02-02 DIAGNOSIS — R29700 NIHSS score 0: Secondary | ICD-10-CM | POA: Diagnosis present

## 2019-02-02 DIAGNOSIS — I340 Nonrheumatic mitral (valve) insufficiency: Secondary | ICD-10-CM | POA: Diagnosis not present

## 2019-02-02 DIAGNOSIS — Z8249 Family history of ischemic heart disease and other diseases of the circulatory system: Secondary | ICD-10-CM

## 2019-02-02 DIAGNOSIS — Z7982 Long term (current) use of aspirin: Secondary | ICD-10-CM

## 2019-02-02 DIAGNOSIS — Z7902 Long term (current) use of antithrombotics/antiplatelets: Secondary | ICD-10-CM

## 2019-02-02 DIAGNOSIS — I6523 Occlusion and stenosis of bilateral carotid arteries: Secondary | ICD-10-CM | POA: Diagnosis not present

## 2019-02-02 LAB — URINALYSIS, ROUTINE W REFLEX MICROSCOPIC
Bilirubin Urine: NEGATIVE
Glucose, UA: NEGATIVE mg/dL
Hgb urine dipstick: NEGATIVE
Ketones, ur: NEGATIVE mg/dL
Nitrite: NEGATIVE
Protein, ur: NEGATIVE mg/dL
Specific Gravity, Urine: 1.004 — ABNORMAL LOW (ref 1.005–1.030)
pH: 7 (ref 5.0–8.0)

## 2019-02-02 LAB — CBC
HCT: 29.5 % — ABNORMAL LOW (ref 39.0–52.0)
Hemoglobin: 9.6 g/dL — ABNORMAL LOW (ref 13.0–17.0)
MCH: 31.5 pg (ref 26.0–34.0)
MCHC: 32.5 g/dL (ref 30.0–36.0)
MCV: 96.7 fL (ref 80.0–100.0)
Platelets: 382 10*3/uL (ref 150–400)
RBC: 3.05 MIL/uL — ABNORMAL LOW (ref 4.22–5.81)
RDW: 16.5 % — ABNORMAL HIGH (ref 11.5–15.5)
WBC: 7.7 10*3/uL (ref 4.0–10.5)
nRBC: 0 % (ref 0.0–0.2)

## 2019-02-02 LAB — DIFFERENTIAL
Abs Immature Granulocytes: 0.02 10*3/uL (ref 0.00–0.07)
Basophils Absolute: 0.1 10*3/uL (ref 0.0–0.1)
Basophils Relative: 1 %
Eosinophils Absolute: 0.7 10*3/uL — ABNORMAL HIGH (ref 0.0–0.5)
Eosinophils Relative: 9 %
Immature Granulocytes: 0 %
Lymphocytes Relative: 34 %
Lymphs Abs: 2.6 10*3/uL (ref 0.7–4.0)
Monocytes Absolute: 0.6 10*3/uL (ref 0.1–1.0)
Monocytes Relative: 8 %
Neutro Abs: 3.7 10*3/uL (ref 1.7–7.7)
Neutrophils Relative %: 48 %

## 2019-02-02 LAB — I-STAT CHEM 8, ED
BUN: 22 mg/dL (ref 8–23)
Calcium, Ion: 1.13 mmol/L — ABNORMAL LOW (ref 1.15–1.40)
Chloride: 91 mmol/L — ABNORMAL LOW (ref 98–111)
Creatinine, Ser: 1.3 mg/dL — ABNORMAL HIGH (ref 0.61–1.24)
Glucose, Bld: 77 mg/dL (ref 70–99)
HCT: 31 % — ABNORMAL LOW (ref 39.0–52.0)
Hemoglobin: 10.5 g/dL — ABNORMAL LOW (ref 13.0–17.0)
Potassium: 3.8 mmol/L (ref 3.5–5.1)
Sodium: 129 mmol/L — ABNORMAL LOW (ref 135–145)
TCO2: 27 mmol/L (ref 22–32)

## 2019-02-02 LAB — COMPREHENSIVE METABOLIC PANEL
ALT: 20 U/L (ref 0–44)
AST: 26 U/L (ref 15–41)
Albumin: 3.4 g/dL — ABNORMAL LOW (ref 3.5–5.0)
Alkaline Phosphatase: 61 U/L (ref 38–126)
Anion gap: 11 (ref 5–15)
BUN: 23 mg/dL (ref 8–23)
CO2: 26 mmol/L (ref 22–32)
Calcium: 8.7 mg/dL — ABNORMAL LOW (ref 8.9–10.3)
Chloride: 90 mmol/L — ABNORMAL LOW (ref 98–111)
Creatinine, Ser: 1.16 mg/dL (ref 0.61–1.24)
GFR calc Af Amer: >60 mL/min (ref >60)
GFR calc non Af Amer: 56 mL/min — ABNORMAL LOW (ref >60)
Glucose, Bld: 82 mg/dL (ref 70–99)
Potassium: 3.8 mmol/L (ref 3.5–5.1)
Sodium: 127 mmol/L — ABNORMAL LOW (ref 135–145)
Total Bilirubin: 0.6 mg/dL (ref 0.3–1.2)
Total Protein: 7.4 g/dL (ref 6.5–8.1)

## 2019-02-02 LAB — RAPID URINE DRUG SCREEN, HOSP PERFORMED
Amphetamines: NOT DETECTED
Barbiturates: NOT DETECTED
Benzodiazepines: NOT DETECTED
Cocaine: NOT DETECTED
Opiates: NOT DETECTED
Tetrahydrocannabinol: NOT DETECTED

## 2019-02-02 LAB — CBG MONITORING, ED: Glucose-Capillary: 73 mg/dL (ref 70–99)

## 2019-02-02 LAB — APTT: aPTT: 76 seconds — ABNORMAL HIGH (ref 24–36)

## 2019-02-02 LAB — PROTIME-INR
INR: 1.3 — ABNORMAL HIGH (ref 0.8–1.2)
Prothrombin Time: 16.2 seconds — ABNORMAL HIGH (ref 11.4–15.2)

## 2019-02-02 LAB — ETHANOL: Alcohol, Ethyl (B): <10 mg/dL (ref <10)

## 2019-02-02 MED ORDER — LEVOTHYROXINE SODIUM 50 MCG PO TABS
50.0000 ug | ORAL_TABLET | Freq: Every day | ORAL | Status: DC
  Administered 2019-02-03 – 2019-02-04 (×2): 50 ug via ORAL
  Filled 2019-02-02 (×2): qty 1

## 2019-02-02 MED ORDER — ASPIRIN EC 81 MG PO TBEC
81.0000 mg | DELAYED_RELEASE_TABLET | Freq: Every day | ORAL | Status: DC
  Administered 2019-02-03 – 2019-02-04 (×2): 81 mg via ORAL
  Filled 2019-02-02 (×2): qty 1

## 2019-02-02 MED ORDER — CLOPIDOGREL BISULFATE 75 MG PO TABS
75.0000 mg | ORAL_TABLET | Freq: Every day | ORAL | Status: DC
  Administered 2019-02-03 – 2019-02-04 (×2): 75 mg via ORAL
  Filled 2019-02-02 (×2): qty 1

## 2019-02-02 MED ORDER — ACETAMINOPHEN 325 MG PO TABS
650.0000 mg | ORAL_TABLET | Freq: Once | ORAL | Status: AC
  Administered 2019-02-02: 650 mg via ORAL
  Filled 2019-02-02: qty 2

## 2019-02-02 MED ORDER — ACETAMINOPHEN 325 MG PO TABS
650.0000 mg | ORAL_TABLET | ORAL | Status: DC | PRN
  Administered 2019-02-03: 650 mg via ORAL
  Filled 2019-02-02: qty 2

## 2019-02-02 MED ORDER — APIXABAN 5 MG PO TABS
5.0000 mg | ORAL_TABLET | Freq: Two times a day (BID) | ORAL | Status: DC
  Administered 2019-02-02 – 2019-02-04 (×4): 5 mg via ORAL
  Filled 2019-02-02 (×4): qty 1

## 2019-02-02 MED ORDER — ACETAMINOPHEN 650 MG RE SUPP: 650.0000 mg | RECTAL | Status: DC | PRN

## 2019-02-02 MED ORDER — ACETAMINOPHEN 160 MG/5ML PO SOLN: 650.0000 mg | ORAL | Status: DC | PRN

## 2019-02-02 MED ORDER — ATORVASTATIN CALCIUM 40 MG PO TABS
40.0000 mg | ORAL_TABLET | Freq: Every day | ORAL | Status: DC
  Administered 2019-02-03: 40 mg via ORAL
  Filled 2019-02-02: qty 1

## 2019-02-02 MED ORDER — INSULIN ASPART 100 UNIT/ML ~~LOC~~ SOLN: 0.0000 [IU] | Freq: Three times a day (TID) | SUBCUTANEOUS | Status: DC

## 2019-02-02 MED ORDER — STROKE: EARLY STAGES OF RECOVERY BOOK
Freq: Once | Status: DC
  Filled 2019-02-02: qty 1

## 2019-02-02 MED ORDER — SODIUM CHLORIDE 0.9 % IV SOLN: INTRAVENOUS | Status: AC

## 2019-02-02 NOTE — ED Notes (Signed)
Code stroke paged out at this time. Edvardo Honse 

## 2019-02-02 NOTE — ED Provider Notes (Signed)
[facility] EMERGENCY DEPARTMENT Provider Note   CSN: [id number] Arrival date & time: 02/02/19  1902  An emergency department physician performed an initial assessment on this suspected stroke patient at 19.  History Chief Complaint  Patient presents with  . Code Stroke    [name] is a 86 y.o. male.  HPI   86 year old male with strokelike symptoms.  History is from son at bedside.  Last known normal at approximately 6:15 PM.  Patient woke up from a nap.  They assisted him to a wheelchair and he was able to wheel himself to the table for dinner.  Sitting at the table though he seemed like he was having trouble coordinating his movements with his arms.  He was having difficulty with his speech.  It seemed like he could not get out what he wanted to say.  Currently symptoms seem improved but he is still not at his baseline.  Patient acknowledges that he does not feel quite right but has a hard time explaining beyond this.  He denies any acute pain.  Past Medical History:  Diagnosis Date  . Arthritis   . Borderline diabetic   . Diabetes mellitus without complication ([place])   . Glaucoma   . Hyperlipidemia   . Hypertension   . PVD (peripheral vascular disease) ([place])   . Sleep apnea    Cpap ordered but doesnt use  . Urinary retention due to benign prostatic hyperplasia 12/28/2018   Patient Active Problem List   Diagnosis Date Noted  . Chronic indwelling Foley catheter 01/22/2019  . Postoperative wound infection 01/20/2019  . Cellulitis 01/18/2019  . Infection of deep incisional surgical site after procedure   . Abnormal urinalysis   . Labile blood glucose   . Urinary retention   . S/P bilateral BKA (below knee amputation) ([place])   . Acute blood loss anemia   . Urinary retention due to benign prostatic hyperplasia 12/28/2018  . Unilateral complete BKA, left, initial encounter ([place]) 12/25/2018  . Dehiscence of amputation stump ([place])   . History of transmetatarsal  amputation of left foot ([place]) 12/09/2018  . Critical limb ischemia with history of revascularization of same extremity   . Gangrene of left foot ([place])   . Diabetic ulcer of toe of left foot associated with type 2 diabetes mellitus ([place])   . Cellulitis of left lower extremity   . Toe infection 11/18/2018  . Peripheral edema   . Acute on chronic diastolic CHF (congestive heart failure) ([place]) 11/09/2018  . S/P BKA (below knee amputation) unilateral, right ([place]) 10/05/2018  . Hyponatremia   . Essential hypertension   . Postoperative pain   . Pain of left heel   . Unable to maintain weight-bearing   . Type 2 diabetes mellitus with diabetic peripheral angiopathy with gangrene ([place])   . Anemia due to acute blood loss   . Chronic kidney disease, stage 3a   . Acquired absence of right leg below knee ([place]) 09/07/2018  . Gangrene of right foot ([place])   . DNR (do not resuscitate) discussion   . Goals of care, counseling/discussion   . Palliative care by specialist   . Type 2 diabetes mellitus with vascular disease ([place])   . Severe protein-calorie malnutrition ([place])   . Ischemic foot 08/20/2018  . Critical lower limb ischemia 08/03/2018  . Type 2 diabetes mellitus with foot ulcer, without long-term current use of insulin ([place])   . Gastroesophageal reflux disease   . Cellulitis of great toe  of right foot 05/29/2018  . Atrial fibrillation, chronic   . Chest pain 07/25/2017  . PAF (paroxysmal atrial fibrillation) ([place]) 07/25/2017  . BPH (benign prostatic hyperplasia) 07/25/2017  . Shortness of breath 05/11/2017  . Hypothyroidism 05/11/2017  . Chronic diastolic CHF (congestive heart failure) ([place]) 05/11/2017  . Moderate aortic stenosis 06/12/2016  . RBBB 06/12/2016  . Peripheral vascular disease ([place]) 08/04/2012  . Essential hypertension, benign 08/04/2012  . Hyperlipidemia 08/04/2012  . Dizziness 08/04/2012   Past Surgical History:  Procedure Laterality Date  . ABDOMINAL AORTOGRAM W/LOWER  EXTREMITY N/A 07/27/2018   Procedure: ABDOMINAL AORTOGRAM W/LOWER EXTREMITY;  Surgeon: Lorretta Harp, MD;  Location: [place] [facility];  Service: Cardiovascular;  Laterality: N/A;  . AMPUTATION Right 08/28/2018   Procedure: RIGHT BELOW KNEE AMPUTATION;  Surgeon: [name], MD;  Location: [name];  Service: Orthopedics;  Laterality: Right;  . AMPUTATION Left 12/02/2018   Procedure: LEFT TRANSMETATARSAL AMPUTATION AND NEGATIVE PRESSURE WOUND VAC PLACEMENT;  Surgeon: [name], MD;  Location: [place];  Service: Orthopedics;  Laterality: Left;  . AMPUTATION Left 12/18/2018   Procedure: LEFT BELOW KNEE AMPUTATION;  Surgeon: [name], MD;  Location: [place];  Service: Orthopedics;  Laterality: Left;  . APPENDECTOMY  1943  . BELOW KNEE LEG AMPUTATION Left 12/18/2018  . CATARACT EXTRACTION  2012   x2  . COLONOSCOPY N/A 11/01/2014   Procedure: COLONOSCOPY;  Surgeon: Aviva Signs, MD;  [facility];  Service: Gastroenterology;  Laterality: N/A;  . ESOPHAGOGASTRODUODENOSCOPY N/A 11/01/2014   Procedure: ESOPHAGOGASTRODUODENOSCOPY (EGD);  Surgeon: Aviva Signs, MD;  [facility];  Service: Gastroenterology;  Laterality: N/A;  . HERNIA REPAIR Left   . INGUINAL HERNIA REPAIR Right 03/01/2013   Procedure: RIGHT INGUINAL HERNIORRHAPHY;  Surgeon: Jamesetta So, MD;  Location: [facility];  Service: General;  Laterality: Right;  . INSERTION OF MESH Right 03/01/2013   Procedure: INSERTION OF MESH;  Surgeon: Jamesetta So, MD;  Location: [facility];  Service: General;  Laterality: Right;  . LOWER EXTREMITY ANGIOGRAPHY Right 08/25/2018   Procedure: LOWER EXTREMITY ANGIOGRAPHY;  Surgeon: Wellington Hampshire, MD;  Location: [place] [facility];  Service: Cardiovascular;  Laterality: Right;  . LOWER EXTREMITY ANGIOGRAPHY N/A 11/25/2018   Procedure: LOWER EXTREMITY ANGIOGRAPHY;  Surgeon: Wellington Hampshire, MD;  Location: [place] [facility];  Service: Cardiovascular;  Laterality: N/A;  . NM MYOCAR  PERF WALL MOTION  06/05/2010   no significant ischemia  . PERIPHERAL VASCULAR ATHERECTOMY Right 08/03/2018   Procedure: PERIPHERAL VASCULAR ATHERECTOMY;  Surgeon: Lorretta Harp, MD;  Location: [place] [facility];  Service: Cardiovascular;  Laterality: Right;  . PERIPHERAL VASCULAR ATHERECTOMY Left 11/23/2018   Procedure: PERIPHERAL VASCULAR ATHERECTOMY;  Surgeon: Lorretta Harp, MD;  Location: [place] [facility];  Service: Cardiovascular;  Laterality: Left;  sfa with dc balloon  . PERIPHERAL VASCULAR BALLOON ANGIOPLASTY Left 11/25/2018   Procedure: PERIPHERAL VASCULAR BALLOON ANGIOPLASTY;  Surgeon: Wellington Hampshire, MD;  Location: [place] [facility];  Service: Cardiovascular;  Laterality: Left;  popliteal  . PERIPHERAL VASCULAR INTERVENTION Left 11/25/2018   Procedure: PERIPHERAL VASCULAR INTERVENTION;  Surgeon: Wellington Hampshire, MD;  Location: [place] [facility];  Service: Cardiovascular;  Laterality: Left;  tibial peroneal trunk and peroneal stents   . stent  06/14/2010   left leg  . STUMP REVISION Left 01/20/2019   Procedure: REVISION LEFT BELOW KNEE AMPUTATION;  Surgeon: [name], MD;  Location: [facility]  OR;  Service: Orthopedics;  Laterality: Left;  . US ECHOCARDIOGRAPHY  01/21/2006   moderate mitral annular ca+, mild MR, AOV moderately sclerotic.     Family History  Problem Relation Age of Onset  . Cancer Mother   . Heart attack Father   . Stroke Father   . Heart attack Brother   . Heart attack Brother    Social History   Tobacco Use  . Smoking status: Former Smoker    Quit date: 01/13/1965    Years since quitting: 54.0  . Smokeless tobacco: Former Systems developer    Types: Chew  Substance Use Topics  . Alcohol use: No  . Drug use: No   Home Medications Prior to Admission medications   Medication Sig Start Date End Date Taking? Authorizing Provider  acetaminophen (TYLENOL) 500 MG tablet Take 1 tablet (500 mg total) by mouth 3 (three) times daily. Patient taking differently:  Take 500 mg by mouth every 8 (eight) hours as needed for moderate pain.  01/05/19   Love, Ivan Anchors, PA-C  apixaban (ELIQUIS) 5 MG TABS tablet Take 5 mg by mouth 2 (two) times daily.    [provider]  aspirin EC 81 MG tablet Take 81 mg by mouth daily.    [provider]  atorvastatin (LIPITOR) 40 MG tablet Take 1 tablet (40 mg total) by mouth daily. 09/22/18   Angiulli, Lavon Paganini, PA-C  clopidogrel (PLAVIX) 75 MG tablet Take 1 tablet (75 mg total) by mouth daily with breakfast. 12/10/18   Mercy Riding, MD  diclofenac Sodium (VOLTAREN) 1 % GEL Apply 2 g topically 4 (four) times daily. Patient taking differently: Apply 2 g topically 2 (two) times daily as needed (pain).  01/05/19   Love, Ivan Anchors, PA-C  docusate sodium (COLACE) 100 MG capsule Take 1 capsule (100 mg total) by mouth 2 (two) times daily. Patient taking differently: Take 100 mg by mouth every evening.  01/05/19   Love, Ivan Anchors, PA-C  dorzolamide-timolol (COSOPT) 22.3-6.8 MG/ML ophthalmic solution Place 1 drop into both eyes 2 (two) times daily.    [provider]  famotidine (PEPCID) 20 MG tablet Take 1 tablet (20 mg total) by mouth daily. 11/12/18   Johnson, Clanford L, MD  ferrous sulfate 325 (65 FE) MG tablet Take 325 mg by mouth 2 (two) times daily with a meal.    [provider]  finasteride (PROSCAR) 5 MG tablet Take 1 tablet (5 mg total) by mouth every evening. 09/22/18   Angiulli, Lavon Paganini, PA-C  furosemide (LASIX) 40 MG tablet Take 1 tablet (40 mg total) by mouth daily. 12/09/18   Mercy Riding, MD  glipiZIDE (GLUCOTROL) 5 MG tablet Take 5 mg by mouth 2 (two) times daily.     [provider]  levothyroxine (SYNTHROID) 50 MCG tablet Take 1 tablet (50 mcg total) by mouth daily before breakfast. 09/22/18   Angiulli, Lavon Paganini, PA-C  methocarbamol (ROBAXIN) 500 MG tablet Take 1 tablet (500 mg total) by mouth every 6 (six) hours as needed for muscle spasms. 01/22/19   Debbe Odea, MD  Misc  Natural Products (GLUCOSAMINE CHOND MSM FORMULA PO) Take 1 tablet by mouth 2 (two) times daily.    [provider]  nutrition supplement, JUVEN, (JUVEN) PACK Take 1 packet by mouth 2 (two) times daily between meals. Patient not taking: Reported on 01/28/2019 01/22/19   Debbe Odea, MD  Omega-3 Fatty Acids (FISH OIL PO) Take 1 capsule by mouth daily.  [provider]  oxyCODONE-acetaminophen (PERCOCET) 5-325 MG tablet Take 1 tablet by mouth every 4 (four) hours as needed for severe pain. 01/22/19 01/22/20  Persons, Bevely Palmer, PA  polyethylene glycol (MIRALAX) 17 g packet Take 17 g by mouth daily as needed for moderate constipation. Patient not taking: Reported on 01/28/2019 12/09/18   Mercy Riding, MD  potassium chloride (KLOR-CON) 10 MEQ tablet Take 1 tablet (10 mEq total) by mouth daily. 11/12/18   Johnson, Clanford L, MD  pyridoxine (B-6) 100 MG tablet Take 100 mg by mouth daily.    [provider]  senna-docusate (SENOKOT-S) 8.6-50 MG tablet Take 2 tablets by mouth at bedtime. Patient not taking: Reported on 01/28/2019 01/05/19   Bary Leriche, PA-C  tamsulosin (FLOMAX) 0.4 MG CAPS capsule Take 1 capsule (0.4 mg total) by mouth 2 (two) times daily. 09/22/18   Angiulli, Lavon Paganini, PA-C  traMADol (ULTRAM) 50 MG tablet Take 1 tablet (50 mg total) by mouth every 6 (six) hours as needed for severe pain. 01/05/19   Love, Ivan Anchors, PA-C  vitamin C (ASCORBIC ACID) 500 MG tablet Take 500 mg by mouth daily.    [provider]  vitamin E 400 UNIT capsule Take 400 Units by mouth daily.    [provider]    Allergies    Iodinated diagnostic agents, Dye fdc red [red dye], Iodine-131, and Iohexol  Review of Systems   Review of Systems   All systems reviewed and negative, other than as noted in HPI.  Physical Exam Updated Vital Signs Ht 5\' 11"  (1.803 m)   Wt 85.3 kg   BMI 26.23 kg/m   Physical Exam Vitals and nursing note reviewed.  Constitutional:       General: He is not in acute distress.    Appearance: He is well-developed.  HENT:     Head: Normocephalic and atraumatic.  Eyes:     General:        Right eye: No discharge.        Left eye: No discharge.     Conjunctiva/sclera: Conjunctivae normal.  Cardiovascular:     Rate and Rhythm: Normal rate and regular rhythm.     Heart sounds: Normal heart sounds. No murmur. No friction rub. No gallop.   Pulmonary:     Effort: Pulmonary effort is normal. No respiratory distress.     Breath sounds: Normal breath sounds.  Abdominal:     General: There is no distension.     Palpations: Abdomen is soft.     Tenderness: There is no abdominal tenderness.  Musculoskeletal:        General: No tenderness.     Cervical back: Neck supple.  Skin:    General: Skin is warm and dry.  Neurological:     Mental Status: He is alert.     Comments: Speech is slow and somewhat dysarthric.  Son reports that it sounds fairly normal for him though without his dentures.  He is alert to himself.  Recognizes that he is in the hospital.  Is disoriented to time.  Seems to have some word finding difficulty.  Cranial nerves II through XII intact.  Strength is 5 out of 5 bilateral upper and lower extremities.     ED Results / Procedures / Treatments   Labs (all labs ordered are listed, but only abnormal results are displayed) Labs Reviewed  PROTIME-INR - Abnormal; Notable for the following components:      Result Value  Prothrombin Time 16.2 (*)    INR 1.3 (*)    All other components within normal limits  APTT - Abnormal; Notable for the following components:   aPTT 76 (*)    All other components within normal limits  CBC - Abnormal; Notable for the following components:   RBC 3.05 (*)    Hemoglobin 9.6 (*)    HCT 29.5 (*)    RDW 16.5 (*)    All other components within normal limits  DIFFERENTIAL - Abnormal; Notable for the following components:   Eosinophils Absolute 0.7 (*)    All other components within  normal limits  I-STAT CHEM 8, ED - Abnormal; Notable for the following components:   Sodium 129 (*)    Chloride 91 (*)    Creatinine, Ser 1.30 (*)    Calcium, Ion 1.13 (*)    Hemoglobin 10.5 (*)    HCT 31.0 (*)    All other components within normal limits  SARS CORONAVIRUS 2 (TAT 6-24 HRS)  ETHANOL  COMPREHENSIVE METABOLIC PANEL  RAPID URINE DRUG SCREEN, HOSP PERFORMED  URINALYSIS, ROUTINE W REFLEX MICROSCOPIC  CBG MONITORING, ED    EKG EKG Interpretation  Date/Time:  Tuesday February 02 2019 19:43:09 EST Ventricular Rate:  56 PR Interval:    QRS Duration: 153 QT Interval:  505 QTC Calculation: 488 R Axis:   63 Text Interpretation: Sinus rhythm Ventricular premature complex Prolonged PR interval Right bundle branch block When compared to prior, new PVC. No STEMI Confirmed by Antony Blackbird [phone]) on 02/03/2019 11:03:41 AM   Radiology CT HEAD CODE STROKE WO CONTRAST  Result Date: 02/02/2019 CLINICAL DATA:  Code stroke.  Speech difficulty. EXAM: CT HEAD WITHOUT CONTRAST TECHNIQUE: Contiguous axial images were obtained from the base of the skull through the vertex without intravenous contrast. COMPARISON:  None. FINDINGS: Brain: Mild atrophy. Negative for hydrocephalus. Patchy white matter hypodensity bilaterally is mild and likely chronic. No acute cortical infarct or mass Small extra-axial fluid collections bilaterally which are slightly denser than CSF. Right-sided fluid collection measures approximately 5 mm and left-sided fluid collection measures approximately 6 mm in thickness. These are likely chronic subdural hematomas. No midline shift. Vascular: Atherosclerotic calcification in the carotid and vertebral arteries. Negative for hyperdense vessel Skull: Negative for skull fracture or lesion Sinuses/Orbits: Paranasal sinuses clear. Bilateral cataract surgery. Other: None ASPECTS ([place] Stroke Program Early CT Score) - Ganglionic level infarction (caudate, lentiform nuclei,  internal capsule, insula, M1-M3 cortex): 7 - Supraganglionic infarction (M4-M6 cortex): 3 Total score (0-10 with 10 being normal): 10 IMPRESSION: 1. Negative for acute infarct 2. ASPECTS is 10 3. Small low-density extra-axial fluid collections bilaterally most likely chronic subdural hematomas. No high-density acute hemorrhage and no midline shift. 4. These results were called by telephone at the time of interpretation on 02/02/2019 at 7:46 pm to provider [facility] , who verbally acknowledged these results. Electronically Signed   By: Franchot Gallo M.D.   On: 02/02/2019 19:46    Procedures Procedures (including critical care time)  CRITICAL CARE Performed by: Virgel Manifold Total critical care time: 35 minutes Critical care time was exclusive of separately billable procedures and treating other patients. Critical care was necessary to treat or prevent imminent or life-threatening deterioration. Critical care was time spent personally by me on the following activities: development of treatment plan with patient and/or surrogate as well as nursing, discussions with consultants, evaluation of patient's response to treatment, examination of patient, obtaining history from patient or surrogate, ordering and performing  treatments and interventions, ordering and review of laboratory studies, ordering and review of radiographic studies, pulse oximetry and re-evaluation of patient's condition.    Medications Ordered in ED Medications - No data to display  ED Course  I have reviewed the triage vital signs and the nursing notes.  Pertinent labs & imaging results that were available during my care of the patient were reviewed by me and considered in my medical decision making (see chart for details).    MDM Rules/Calculators/A&P                      26yM with stroke like symptoms, now resolved. Evaluated by teleneurology. Admit for TIA w/u.  Final Clinical Impression(s) / ED Diagnoses Final  diagnoses:  TIA (transient ischemic attack)    Rx / DC Orders ED Discharge Orders    None       Virgel Manifold, MD 02/03/19 [phone]

## 2019-02-02 NOTE — ED Notes (Signed)
Dr Wilson Singer at bedside assessing pt.

## 2019-02-02 NOTE — ED Notes (Signed)
Pt is in MRI at this time.

## 2019-02-02 NOTE — H&P (Signed)
History and Physical    [name]:[id number] DOB: Jan 17, 1927 DOA: 02/02/2019  PCP: Redmond School, MD   Patient coming from: Home   Chief Complaint: Speech difficulty   HPI: [name] is a 86 y.o. male with medical history significant for type 2 diabetes mellitus, hypertension, chronic diastolic CHF, hypothyroidism, paroxysmal atrial fibrillation on Eliquis, and recent revision of left BKA, now presenting to the emergency department with speech difficulty.  He is accompanied by his son who assists with the history.  The patient was reportedly in his usual state when he laid down for a nap at approximately 5:30 PM but was noted to have dysarthria and nonsensical speech at roughly 6:10 PM.  Family member who was a nurse came to the house to evaluate the patient and called EMS.  Patient denies any recent fevers, chills, cough, shortness of breath, chest pain, or palpitations.  There was no fall or trauma.  ED Course: Upon arrival to the ED, patient is found to be bradycardic in the 50s, saturating 99% on room air, and with stable blood pressure.  EKG features sinus bradycardia with rate 56, PVC, first-degree AV nodal block, and RBBB.  Noncontrast head CT is negative for acute intracranial hemorrhage or infarction but notable for small low-density extra-axial fluid collections bilaterally that likely reflect chronic SDH without acute hemorrhage or midline shift.  Chemistry panel notable for sodium of 127, down from 133 earlier this month.  CBC features and improved normocytic anemia with hemoglobin 9.6.  Teleneurology evaluated the patient in the ED and recommended admission for ongoing evaluation and management of possible CVA/TIA.  COVID-19 PCR screening test has not yet resulted.  Review of Systems:  All other systems reviewed and apart from HPI, are negative.  Past Medical History:  Diagnosis Date  . Arthritis   . Borderline diabetic   . Diabetes mellitus without complication  ([place])   . Glaucoma   . Hyperlipidemia   . Hypertension   . PVD (peripheral vascular disease) ([place])   . Sleep apnea    Cpap ordered but doesnt use  . Urinary retention due to benign prostatic hyperplasia 12/28/2018    Past Surgical History:  Procedure Laterality Date  . ABDOMINAL AORTOGRAM W/LOWER EXTREMITY N/A 07/27/2018   Procedure: ABDOMINAL AORTOGRAM W/LOWER EXTREMITY;  Surgeon: Lorretta Harp, MD;  Location: [facility] [facility];  Service: Cardiovascular;  Laterality: N/A;  . AMPUTATION Right 08/28/2018   Procedure: RIGHT BELOW KNEE AMPUTATION;  Surgeon: [name], MD;  Location: [facility];  Service: Orthopedics;  Laterality: Right;  . AMPUTATION Left 12/02/2018   Procedure: LEFT TRANSMETATARSAL AMPUTATION AND NEGATIVE PRESSURE WOUND VAC PLACEMENT;  Surgeon: [name], MD;  Location: [facility];  Service: Orthopedics;  Laterality: Left;  . AMPUTATION Left 12/18/2018   Procedure: LEFT BELOW KNEE AMPUTATION;  Surgeon: [name], MD;  Location: [facility];  Service: Orthopedics;  Laterality: Left;  . APPENDECTOMY  1943  . BELOW KNEE LEG AMPUTATION Left 12/18/2018  . CATARACT EXTRACTION  2012   x2  . COLONOSCOPY N/A 11/01/2014   Procedure: COLONOSCOPY;  Surgeon: Aviva Signs, MD;  [facility];  Service: Gastroenterology;  Laterality: N/A;  . ESOPHAGOGASTRODUODENOSCOPY N/A 11/01/2014   Procedure: ESOPHAGOGASTRODUODENOSCOPY (EGD);  Surgeon: Aviva Signs, MD;  [facility];  Service: Gastroenterology;  Laterality: N/A;  . HERNIA REPAIR Left   . INGUINAL HERNIA REPAIR Right 03/01/2013   Procedure: RIGHT INGUINAL HERNIORRHAPHY;  Surgeon: Jamesetta So, MD;  Location: [facility];  Service: General;  Laterality: Right;  . INSERTION OF MESH Right 03/01/2013   Procedure: INSERTION OF MESH;  Surgeon: Jamesetta So, MD;  Location: [facility];  Service: General;  Laterality: Right;  . LOWER EXTREMITY ANGIOGRAPHY Right 08/25/2018   Procedure: LOWER EXTREMITY ANGIOGRAPHY;  Surgeon:  Wellington Hampshire, MD;  Location: [place] [facility];  Service: Cardiovascular;  Laterality: Right;  . LOWER EXTREMITY ANGIOGRAPHY N/A 11/25/2018   Procedure: LOWER EXTREMITY ANGIOGRAPHY;  Surgeon: Wellington Hampshire, MD;  Location: [place] [facility];  Service: Cardiovascular;  Laterality: N/A;  . NM MYOCAR PERF WALL MOTION  06/05/2010   no significant ischemia  . PERIPHERAL VASCULAR ATHERECTOMY Right 08/03/2018   Procedure: PERIPHERAL VASCULAR ATHERECTOMY;  Surgeon: Lorretta Harp, MD;  Location: [facility];  Service: Cardiovascular;  Laterality: Right;  . PERIPHERAL VASCULAR ATHERECTOMY Left 11/23/2018   Procedure: PERIPHERAL VASCULAR ATHERECTOMY;  Surgeon: Lorretta Harp, MD;  Location: [facility];  Service: Cardiovascular;  Laterality: Left;  sfa with dc balloon  . PERIPHERAL VASCULAR BALLOON ANGIOPLASTY Left 11/25/2018   Procedure: PERIPHERAL VASCULAR BALLOON ANGIOPLASTY;  Surgeon: Wellington Hampshire, MD;  Location: [facility];  Service: Cardiovascular;  Laterality: Left;  popliteal  . PERIPHERAL VASCULAR INTERVENTION Left 11/25/2018   Procedure: PERIPHERAL VASCULAR INTERVENTION;  Surgeon: Wellington Hampshire, MD;  Location: [facility];  Service: Cardiovascular;  Laterality: Left;  tibial peroneal trunk and peroneal stents   . stent  06/14/2010   left leg  . STUMP REVISION Left 01/20/2019   Procedure: REVISION LEFT BELOW KNEE AMPUTATION;  Surgeon: [name], MD;  Location: [facility];  Service: Orthopedics;  Laterality: Left;  . US ECHOCARDIOGRAPHY  01/21/2006   moderate mitral annular ca+, mild MR, AOV moderately sclerotic.     reports that he quit smoking about 54 years ago. He has quit using smokeless tobacco.  His smokeless tobacco use included chew. He reports that he does not drink alcohol or use drugs.  Allergies  Allergen Reactions  . Iodinated Diagnostic Agents     Other reaction(s): Fever  . Dye Fdc Red [Red Dye] Hives    Tolerated red Docusate,  Niferex  . Iodine-131 Other (See Comments)    Breakout, fever  . Iohexol      Code: RASH, Desc: PT STATES HOT ALL OVER HAD TO HAVE ICE PACKS ALL OVER HIM AND DIFF BREATHING, PT REFUSED IV DYE     Family History  Problem Relation Age of Onset  . Cancer Mother   . Heart attack Father   . Stroke Father   . Heart attack Brother   . Heart attack Brother      Prior to Admission medications   Medication Sig Start Date End Date Taking? Authorizing Provider  apixaban (ELIQUIS) 5 MG TABS tablet Take 5 mg by mouth 2 (two) times daily.    Yes [provider]  atorvastatin (LIPITOR) 40 MG tablet Take 1 tablet (40 mg total) by mouth daily. 09/22/18  Yes Angiulli, Lavon Paganini, PA-C  diclofenac Sodium (VOLTAREN) 1 % GEL Apply 2 g topically 4 (four) times daily. Patient taking differently: Apply 2 g topically 2 (two) times daily as needed (pain).  01/05/19  Yes Love, Ivan Anchors, PA-C  docusate sodium (COLACE) 100 MG capsule Take 1 capsule (100 mg total) by mouth 2 (two) times daily. Patient taking differently: Take 100 mg by mouth every evening.  01/05/19  Yes Love, Olin Hauser  S, PA-C  finasteride (PROSCAR) 5 MG tablet Take 1 tablet (5 mg total) by mouth every evening. 09/22/18  Yes Angiulli, Lavon Paganini, PA-C  furosemide (LASIX) 40 MG tablet Take 1 tablet (40 mg total) by mouth daily. 12/09/18  Yes Mercy Riding, MD  glipiZIDE (GLUCOTROL) 5 MG tablet Take 5 mg by mouth 2 (two) times daily.    Yes [provider]  methocarbamol (ROBAXIN) 500 MG tablet Take 1 tablet (500 mg total) by mouth every 6 (six) hours as needed for muscle spasms. 01/22/19  Yes Debbe Odea, MD  potassium chloride (KLOR-CON) 10 MEQ tablet Take 1 tablet (10 mEq total) by mouth daily. 11/12/18  Yes Johnson, Clanford L, MD  tamsulosin (FLOMAX) 0.4 MG CAPS capsule Take 1 capsule (0.4 mg total) by mouth 2 (two) times daily. 09/22/18  Yes Angiulli, Lavon Paganini, PA-C  acetaminophen (TYLENOL) 500 MG tablet Take 1 tablet (500 mg total) by  mouth 3 (three) times daily. Patient taking differently: Take 500 mg by mouth every 8 (eight) hours as needed for moderate pain.  01/05/19   Love, Ivan Anchors, PA-C  aspirin EC 81 MG tablet Take 81 mg by mouth daily.    [provider]  clopidogrel (PLAVIX) 75 MG tablet Take 1 tablet (75 mg total) by mouth daily with breakfast. 12/10/18   Mercy Riding, MD  dorzolamide-timolol (COSOPT) 22.3-6.8 MG/ML ophthalmic solution Place 1 drop into both eyes 2 (two) times daily.    [provider]  famotidine (PEPCID) 20 MG tablet Take 1 tablet (20 mg total) by mouth daily. 11/12/18   Johnson, Clanford L, MD  ferrous sulfate 325 (65 FE) MG tablet Take 325 mg by mouth 2 (two) times daily with a meal.    [provider]  levothyroxine (SYNTHROID) 50 MCG tablet Take 1 tablet (50 mcg total) by mouth daily before breakfast. 09/22/18   Angiulli, Lavon Paganini, PA-C  Misc Natural Products (Hinds MSM FORMULA PO) Take 1 tablet by mouth 2 (two) times daily.    [provider]  nutrition supplement, JUVEN, (JUVEN) PACK Take 1 packet by mouth 2 (two) times daily between meals. Patient not taking: Reported on 01/28/2019 01/22/19   Debbe Odea, MD  Omega-3 Fatty Acids (FISH OIL PO) Take 1 capsule by mouth daily.    [provider]  oxyCODONE-acetaminophen (PERCOCET) 5-325 MG tablet Take 1 tablet by mouth every 4 (four) hours as needed for severe pain. 01/22/19 01/22/20  Persons, Bevely Palmer, PA  polyethylene glycol (MIRALAX) 17 g packet Take 17 g by mouth daily as needed for moderate constipation. Patient not taking: Reported on 01/28/2019 12/09/18   Mercy Riding, MD  pyridoxine (B-6) 100 MG tablet Take 100 mg by mouth daily.    [provider]  senna-docusate (SENOKOT-S) 8.6-50 MG tablet Take 2 tablets by mouth at bedtime. Patient not taking: Reported on 01/28/2019 01/05/19   Love, Ivan Anchors, PA-C  traMADol (ULTRAM) 50 MG tablet Take 1 tablet (50 mg total) by mouth every 6  (six) hours as needed for severe pain. 01/05/19   Love, Ivan Anchors, PA-C  vitamin C (ASCORBIC ACID) 500 MG tablet Take 500 mg by mouth daily.    [provider]  vitamin E 400 UNIT capsule Take 400 Units by mouth daily.    [provider]    Physical Exam: Vitals:   02/02/19 1916 02/02/19 2000 02/02/19 2030 02/02/19 2100  BP:  (!) 164/63 (!) 159/56 138/70  Pulse:  (!) 54 [name])  53 (!) 41  Resp:  12 12 15   SpO2:  99% 99% (!) 88%  Weight: 85.3 kg     Height: 5\' 11"  (1.803 m)       Constitutional: NAD, calm  Eyes: PERTLA, lids and conjunctivae normal ENMT: Mucous membranes are moist. Posterior pharynx clear of any exudate or lesions.   Neck: normal, supple, no masses, no thyromegaly Respiratory: no wheezing, no crackles. No accessory muscle use.  Cardiovascular: S1 & S2 heard, regular rate and rhythm. No extremity edema.   Abdomen: No distension, no tenderness, soft. Bowel sounds active.  Musculoskeletal: no clubbing / cyanosis. No joint deformity upper and lower extremities.   Skin: no significant rashes, lesions, ulcers. Warm, dry, well-perfused. Neurologic: CN 2-12 grossly intact. Sensation intact. Strength 5/5 in all 4 limbs.  Psychiatric: Alert and oriented to person, place, and situation. Blunted affect. Cooperative.    Labs and Imaging on Admission: I have personally reviewed following labs and imaging studies  CBC: Recent Labs  Lab 02/02/19 1926 02/02/19 1933  WBC 7.7  --   NEUTROABS 3.7  --   HGB 9.6* 10.5*  HCT 29.5* 31.0*  MCV 96.7  --   PLT 382  --    Basic Metabolic Panel: Recent Labs  Lab 02/02/19 1926 02/02/19 1933  NA 127* 129*  K 3.8 3.8  CL 90* 91*  CO2 26  --   GLUCOSE 82 77  BUN 23 22  CREATININE 1.16 1.30*  CALCIUM 8.7*  --    GFR: Estimated Creatinine Clearance: 41 mL/min (A) (by C-G formula based on SCr of 1.3 mg/dL (H)). Liver Function Tests: Recent Labs  Lab 02/02/19 1926  AST 26  ALT 20  ALKPHOS 61  BILITOT 0.6   PROT 7.4  ALBUMIN 3.4*   No results for input(s): LIPASE, AMYLASE in the last 168 hours. No results for input(s): AMMONIA in the last 168 hours. Coagulation Profile: Recent Labs  Lab 02/02/19 1926  INR 1.3*   Cardiac Enzymes: No results for input(s): CKTOTAL, CKMB, CKMBINDEX, TROPONINI in the last 168 hours. BNP (last 3 results) No results for input(s): PROBNP in the last 8760 hours. HbA1C: No results for input(s): HGBA1C in the last 72 hours. CBG: Recent Labs  Lab 02/02/19 1926  GLUCAP 73   Lipid Profile: No results for input(s): CHOL, HDL, LDLCALC, TRIG, CHOLHDL, LDLDIRECT in the last 72 hours. Thyroid Function Tests: No results for input(s): TSH, T4TOTAL, FREET4, T3FREE, THYROIDAB in the last 72 hours. Anemia Panel: No results for input(s): VITAMINB12, FOLATE, FERRITIN, TIBC, IRON, RETICCTPCT in the last 72 hours. Urine analysis:    Component Value Date/Time   COLORURINE STRAW (A) 02/02/2019 1924   APPEARANCEUR CLEAR 02/02/2019 1924   LABSPEC 1.004 (L) 02/02/2019 1924   PHURINE 7.0 02/02/2019 1924   GLUCOSEU NEGATIVE 02/02/2019 1924   HGBUR NEGATIVE 02/02/2019 1924   BILIRUBINUR NEGATIVE 02/02/2019 [place] NEGATIVE 02/02/2019 1924   PROTEINUR NEGATIVE 02/02/2019 1924   NITRITE NEGATIVE 02/02/2019 1924   LEUKOCYTESUR MODERATE (A) 02/02/2019 1924   Sepsis Labs: @LABRCNTIP (procalcitonin:4,lacticidven:4) )No results found for this or any previous visit (from the past 240 hour(s)).   Radiological Exams on Admission: CT HEAD CODE STROKE WO CONTRAST  Result Date: 02/02/2019 CLINICAL DATA:  Code stroke.  Speech difficulty. EXAM: CT HEAD WITHOUT CONTRAST TECHNIQUE: Contiguous axial images were obtained from the base of the skull through the vertex without intravenous contrast. COMPARISON:  None. FINDINGS: Brain: Mild atrophy. Negative for hydrocephalus. Patchy white matter  hypodensity bilaterally is mild and likely chronic. No acute cortical infarct or mass  Small extra-axial fluid collections bilaterally which are slightly denser than CSF. Right-sided fluid collection measures approximately 5 mm and left-sided fluid collection measures approximately 6 mm in thickness. These are likely chronic subdural hematomas. No midline shift. Vascular: Atherosclerotic calcification in the carotid and vertebral arteries. Negative for hyperdense vessel Skull: Negative for skull fracture or lesion Sinuses/Orbits: Paranasal sinuses clear. Bilateral cataract surgery. Other: None ASPECTS ([place] Stroke Program Early CT Score) - Ganglionic level infarction (caudate, lentiform nuclei, internal capsule, insula, M1-M3 cortex): 7 - Supraganglionic infarction (M4-M6 cortex): 3 Total score (0-10 with 10 being normal): 10 IMPRESSION: 1. Negative for acute infarct 2. ASPECTS is 10 3. Small low-density extra-axial fluid collections bilaterally most likely chronic subdural hematomas. No high-density acute hemorrhage and no midline shift. 4. These results were called by telephone at the time of interpretation on 02/02/2019 at 7:46 pm to provider [facility] , who verbally acknowledged these results. Electronically Signed   By: Franchot Gallo M.D.   On: 02/02/2019 19:46    EKG: Independently reviewed. Sinus rhythm, rate 56, PVC, 1st degree AV block, RBBB.   Assessment/Plan   1. Transient speech disturbance  - Patient was noted to have expressive aphasia and dysarthria around 18:10 this evening after being in usual state ~17:30, did not notice any focal numbness or weakness, came into ED, and had resolution of symptoms within ~ 1hr  - No acute infarction or hemorrhage noted on head CT but chronic bilateral SDH suspected  - Hyponatremia noted, not far from prior levels and with resolution of his speech difficulty, presentation not likely related to hyponatremia  - Teleneurology recommends MRI brain, MRA head and neck, and additional CVA/TIA workup  - MRI tech unable to perform MRA neck  d/t kyphosis and pt positioning  - Continue cardiac monitoring, continue neuro checks, PT/OT/SLP evals  - Check MRI brain, carotid US, fasting lipids, and echocardiogram; A1c already done this month  - Continue Eliquis, antiplatelets, and statin    2. Paroxysmal atrial fibrillation  - In sinus rhythm on admission  - CHADS-VASc is at least 5 (age x2, CAD, CHF, DM) - Continue Eliquis   3. Hyponatremia  - Serum sodium is 127 in ED   - Appears hypovolemic on admission  - Hold Lasix, continue gentle IVF hydration overnight, repeat chem panel in am    4. Anemia  - Hgb is 9.6 on admission, improved since recent surgery    5. Chronic diastolic CHF  - Appears hypovolemic in ED - Hold Lasix, hydrate gently overnight    6. Type II DM  - A1c was only 5.6% earlier this month  - Hold glipizide, check CBG's, and use low-intensity SSI as needed   7. Hypothyroidism  - Continue Synthroid    DVT prophylaxis: Eliquis   Code Status: DNR  Family Communication: son updated at bedside  Consults called: None  Admission status: Observation     Vianne Bulls, MD [facility] Pager: See [email or web address]  If 7AM-7PM, please contact the daytime attending [email or web address]  02/02/2019, 9:19 PM

## 2019-02-02 NOTE — ED Notes (Signed)
Pt unable to provide information. Pt knows first name but unable to state last name or DOB. Pt family called for more information.

## 2019-02-02 NOTE — ED Notes (Signed)
Dr Wilson Singer called Code Stroke. Family remains at bedside.

## 2019-02-02 NOTE — ED Notes (Signed)
Pt taken to CT.

## 2019-02-02 NOTE — Consult Note (Addendum)
[facility]rology Consult Services   Date of Service:   02/02/2019 19:33:57  Impression:     .  G45.9 - Transient cerebral ischaemic attack, unspecified  Comments/Sign-Out: 86 yo M with acute onset of aphasia with word finding difficulty and dysarthria that resolved within the hour. CT negative for stroke. Compliant with eliquis and NIHSS 0 no candidate for tPA and do not suspect LVO  Metrics: Last Known Well: 02/02/2019 17:30:00 [facility] Notification Time: 02/02/2019 19:33:57 Arrival Time: 02/02/2019 19:02:00 Stamp Time: 02/02/2019 19:33:57 Time First Login Attempt: 02/02/2019 19:35:45 Symptoms: aphasia NIHSS Start Assessment Time: 02/02/2019 19:42:48 Patient is not a candidate for Alteplase/Activase. Patient was not deemed candidate for Alteplase/Activase thrombolytics because of Use of NOAs within 48 hours.  CT head showed no acute hemorrhage or acute core infarct.  Clinical Presentation is not Suggestive of Large Vessel Occlusive Disease  ED Physician notified of diagnostic impression and management plan on 02/02/2019 19:53:51  Our recommendations are outlined below.  Recommendations:     .  Activate Stroke Protocol Admission/Order Set     .  Stroke/Telemetry Floor     .  Neuro Checks     .  Bedside Swallow Eval     .  DVT Prophylaxis     .  IV Fluids, Normal Saline     .  Head of Bed 30 Degrees     .  Euglycemia and Avoid Hyperthermia (PRN Acetaminophen)      .  Continue eliquis if asymptomatic, evevning dose due     .  Admit for MRI, MRA head and neck and stroke/TIA workup  Routine Consultation with [facility] for Follow up Care  Sign Out:     .  Discussed with Emergency Department Provider    ------------------------------------------------------------------------------  History of Present Illness: Patient is a 86 year old Male.  Patient was brought by EMS for symptoms of aphasia  86 yo M hx Afib on Eliquis and  bilateral BKA this summer , who went to sleep for a nap at 1730 after an OT session. He woke up at 1810 and was confused and could not get his words out correctly. He had no facial droop or lateralizing symptoms.   He has a left BKA that was revised 2 weeks ago. He has maintained on anticoag with eliquis, now up to 5 mg BID. He took his last dose this morning but hadn't eaten dinner yet.  Last seen normal was within 4.5 hours. There is no history of hemorrhagic complications or intracranial hemorrhage. There is history of Recent Anticoagulants. There is no history of recent major surgery. There is no history of recent stroke.  Past Medical History:     . Atrial Fibrillation     . There is NO history of Stroke  Anticoagulant use:  eliquis     Examination: BP(178/78), Pulse(63), Blood Glucose(73) 1A: Level of Consciousness - Alert; keenly responsive + 0 1B: Ask Month and Age - Both Questions Right + 0 1C: Blink Eyes & Squeeze Hands - Performs Both Tasks + 0 2: Test Horizontal Extraocular Movements - Normal + 0 3: Test Visual Fields - No Visual Loss + 0 4: Test Facial Palsy (Use Grimace if Obtunded) - Normal symmetry + 0 5A: Test Left Arm Motor Drift - No Drift for 10 Seconds + 0 5B: Test Right Arm Motor Drift - No Drift for 10 Seconds + 0 6A: Test Left Leg Motor Drift - No Drift for 5 Seconds + 0 6B: Test  Right Leg Motor Drift - No Drift for 5 Seconds + 0 7: Test Limb Ataxia (FNF/Heel-Shin) - No Ataxia + 0 8: Test Sensation - Normal; No sensory loss + 0 9: Test Language/Aphasia - Normal; No aphasia + 0 10: Test Dysarthria - Normal + 0 11: Test Extinction/Inattention - No abnormality + 0  NIHSS Score: 0  Pre-Morbid Modified Ranking Scale: 4 Points = Moderately severe disability; unable to walk and attend to bodily needs without assistance   Patient/Family was informed the Neurology Consult would happen via TeleHealth consult by way of interactive audio and video  telecommunications and consented to receiving care in this manner.   Due to the immediate potential for life-threatening deterioration due to underlying acute neurologic illness, I spent 35 minutes providing critical care. This time includes time for face to face visit via telemedicine, review of medical records, imaging studies and discussion of findings with providers, the patient and/or family.   Dr Leda Quail   [facility] [phone]   Case [id number]

## 2019-02-02 NOTE — ED Notes (Signed)
Pt returned from CT. Teleneurologist assessing pt.

## 2019-02-02 NOTE — ED Notes (Signed)
Pt son called and asked to come to RM 18 for clarification of LKW.

## 2019-02-02 NOTE — Progress Notes (Signed)
Code stroke documentation CALL TIME = 1913 BEEPER TIME = [place] = 1926 EXAM FINISHED = [place] = [place] = 1931 [place] = [phone]

## 2019-02-02 NOTE — ED Triage Notes (Signed)
Pt brought to ED via [facility] for AMS x approx 1.5 hours. Per EMS, pt can follow commands but can't answer commands. Family states woke him up from nap at 1810 and was altered. CBG 93. LKW 1730

## 2019-02-03 ENCOUNTER — Observation Stay (HOSPITAL_COMMUNITY): Payer: Medicare Other

## 2019-02-03 ENCOUNTER — Observation Stay (HOSPITAL_BASED_OUTPATIENT_CLINIC_OR_DEPARTMENT_OTHER): Payer: Medicare Other

## 2019-02-03 DIAGNOSIS — K219 Gastro-esophageal reflux disease without esophagitis: Secondary | ICD-10-CM | POA: Diagnosis present

## 2019-02-03 DIAGNOSIS — E871 Hypo-osmolality and hyponatremia: Secondary | ICD-10-CM

## 2019-02-03 DIAGNOSIS — I6203 Nontraumatic chronic subdural hemorrhage: Secondary | ICD-10-CM | POA: Diagnosis present

## 2019-02-03 DIAGNOSIS — I4510 Unspecified right bundle-branch block: Secondary | ICD-10-CM | POA: Diagnosis present

## 2019-02-03 DIAGNOSIS — R4701 Aphasia: Secondary | ICD-10-CM | POA: Diagnosis present

## 2019-02-03 DIAGNOSIS — M1990 Unspecified osteoarthritis, unspecified site: Secondary | ICD-10-CM | POA: Diagnosis present

## 2019-02-03 DIAGNOSIS — I361 Nonrheumatic tricuspid (valve) insufficiency: Secondary | ICD-10-CM | POA: Diagnosis not present

## 2019-02-03 DIAGNOSIS — E1122 Type 2 diabetes mellitus with diabetic chronic kidney disease: Secondary | ICD-10-CM | POA: Diagnosis present

## 2019-02-03 DIAGNOSIS — R479 Unspecified speech disturbances: Secondary | ICD-10-CM

## 2019-02-03 DIAGNOSIS — I5032 Chronic diastolic (congestive) heart failure: Secondary | ICD-10-CM

## 2019-02-03 DIAGNOSIS — I350 Nonrheumatic aortic (valve) stenosis: Secondary | ICD-10-CM | POA: Diagnosis not present

## 2019-02-03 DIAGNOSIS — I130 Hypertensive heart and chronic kidney disease with heart failure and stage 1 through stage 4 chronic kidney disease, or unspecified chronic kidney disease: Secondary | ICD-10-CM | POA: Diagnosis present

## 2019-02-03 DIAGNOSIS — R338 Other retention of urine: Secondary | ICD-10-CM | POA: Diagnosis present

## 2019-02-03 DIAGNOSIS — Z66 Do not resuscitate: Secondary | ICD-10-CM | POA: Diagnosis present

## 2019-02-03 DIAGNOSIS — I6523 Occlusion and stenosis of bilateral carotid arteries: Secondary | ICD-10-CM | POA: Diagnosis not present

## 2019-02-03 DIAGNOSIS — N1831 Chronic kidney disease, stage 3a: Secondary | ICD-10-CM | POA: Diagnosis present

## 2019-02-03 DIAGNOSIS — I480 Paroxysmal atrial fibrillation: Secondary | ICD-10-CM | POA: Diagnosis present

## 2019-02-03 DIAGNOSIS — E039 Hypothyroidism, unspecified: Secondary | ICD-10-CM | POA: Diagnosis present

## 2019-02-03 DIAGNOSIS — G4730 Sleep apnea, unspecified: Secondary | ICD-10-CM | POA: Diagnosis present

## 2019-02-03 DIAGNOSIS — I340 Nonrheumatic mitral (valve) insufficiency: Secondary | ICD-10-CM | POA: Diagnosis not present

## 2019-02-03 DIAGNOSIS — Z89512 Acquired absence of left leg below knee: Secondary | ICD-10-CM | POA: Diagnosis not present

## 2019-02-03 DIAGNOSIS — G459 Transient cerebral ischemic attack, unspecified: Secondary | ICD-10-CM | POA: Diagnosis present

## 2019-02-03 DIAGNOSIS — I10 Essential (primary) hypertension: Secondary | ICD-10-CM

## 2019-02-03 DIAGNOSIS — L8990 Pressure ulcer of unspecified site, unspecified stage: Secondary | ICD-10-CM | POA: Insufficient documentation

## 2019-02-03 DIAGNOSIS — D649 Anemia, unspecified: Secondary | ICD-10-CM

## 2019-02-03 DIAGNOSIS — E1159 Type 2 diabetes mellitus with other circulatory complications: Secondary | ICD-10-CM

## 2019-02-03 DIAGNOSIS — H409 Unspecified glaucoma: Secondary | ICD-10-CM | POA: Diagnosis present

## 2019-02-03 DIAGNOSIS — Z20822 Contact with and (suspected) exposure to covid-19: Secondary | ICD-10-CM | POA: Diagnosis present

## 2019-02-03 DIAGNOSIS — Z89511 Acquired absence of right leg below knee: Secondary | ICD-10-CM | POA: Diagnosis not present

## 2019-02-03 DIAGNOSIS — Z7901 Long term (current) use of anticoagulants: Secondary | ICD-10-CM | POA: Diagnosis not present

## 2019-02-03 DIAGNOSIS — N401 Enlarged prostate with lower urinary tract symptoms: Secondary | ICD-10-CM | POA: Diagnosis present

## 2019-02-03 DIAGNOSIS — E785 Hyperlipidemia, unspecified: Secondary | ICD-10-CM | POA: Diagnosis present

## 2019-02-03 DIAGNOSIS — E1151 Type 2 diabetes mellitus with diabetic peripheral angiopathy without gangrene: Secondary | ICD-10-CM | POA: Diagnosis present

## 2019-02-03 LAB — CBG MONITORING, ED
Glucose-Capillary: 91 mg/dL (ref 70–99)
Glucose-Capillary: 103 mg/dL — ABNORMAL HIGH (ref 70–99)

## 2019-02-03 LAB — SARS CORONAVIRUS 2 (TAT 6-24 HRS): SARS Coronavirus 2: NEGATIVE

## 2019-02-03 LAB — LIPID PANEL
Cholesterol: 80 mg/dL (ref 0–200)
HDL: 26 mg/dL — ABNORMAL LOW (ref >40)
LDL Cholesterol: 37 mg/dL (ref 0–99)
Total CHOL/HDL Ratio: 3.1 RATIO
Triglycerides: 85 mg/dL (ref <150)
VLDL: 17 mg/dL (ref 0–40)

## 2019-02-03 LAB — BASIC METABOLIC PANEL
Anion gap: 13 (ref 5–15)
BUN: 23 mg/dL (ref 8–23)
CO2: 25 mmol/L (ref 22–32)
Calcium: 8.5 mg/dL — ABNORMAL LOW (ref 8.9–10.3)
Chloride: 91 mmol/L — ABNORMAL LOW (ref 98–111)
Creatinine, Ser: 1.33 mg/dL — ABNORMAL HIGH (ref 0.61–1.24)
GFR calc Af Amer: 55 mL/min — ABNORMAL LOW (ref >60)
GFR calc non Af Amer: 47 mL/min — ABNORMAL LOW (ref >60)
Glucose, Bld: 111 mg/dL — ABNORMAL HIGH (ref 70–99)
Potassium: 3.2 mmol/L — ABNORMAL LOW (ref 3.5–5.1)
Sodium: 129 mmol/L — ABNORMAL LOW (ref 135–145)

## 2019-02-03 LAB — ECHOCARDIOGRAM COMPLETE
Height: 71 in
Weight: 3008.84 oz

## 2019-02-03 LAB — GLUCOSE, CAPILLARY
Glucose-Capillary: 113 mg/dL — ABNORMAL HIGH (ref 70–99)
Glucose-Capillary: 130 mg/dL — ABNORMAL HIGH (ref 70–99)

## 2019-02-03 MED ORDER — SODIUM CHLORIDE 0.9 % IV SOLN: INTRAVENOUS | Status: AC

## 2019-02-03 MED ORDER — CHLORHEXIDINE GLUCONATE CLOTH 2 % EX PADS
6.0000 | MEDICATED_PAD | Freq: Every day | CUTANEOUS | Status: DC
  Administered 2019-02-03: 6 via TOPICAL

## 2019-02-03 MED ORDER — POTASSIUM CHLORIDE CRYS ER 20 MEQ PO TBCR
40.0000 meq | EXTENDED_RELEASE_TABLET | Freq: Once | ORAL | Status: AC
  Administered 2019-02-03: 40 meq via ORAL
  Filled 2019-02-03: qty 2

## 2019-02-03 NOTE — Progress Notes (Signed)
PROGRESS NOTE [facility]   [name]  GUY:[id number]  DOB: 1927-12-10  DOA: 02/02/2019 PCP: Redmond School, MD   Brief Admission Hx: 86 y.o. male with medical history significant for type 2 diabetes mellitus, hypertension, chronic diastolic CHF, hypothyroidism, paroxysmal atrial fibrillation on Eliquis, and recent revision of left BKA, now presenting to the emergency department with speech difficulty.   MDM/Assessment & Plan:   1. Expressive aphasia -fortunately symptoms resolved shortly after arrival and he has not had any recurrence.  He was evaluated by telemetry neurology and they recommended a full TIA stroke work-up which is in process.  Continue to follow.  MRI with no findings of acute infarct.  For PT OT evaluation and speech therapy evaluations. 2. Paroxysmal atrial fibrillation-he remains in sinus rhythm and he is fully anticoagulated with apixaban 3. Hyponatremia-likely secondary to diuretics which are currently being held.  His sodium is improving with hydration.  Continue to monitor. 4. Anemia of chronic disease-he is recently postop hemoglobin is 9.6.  Follow. 5. Chronic diastolic CHF-he is mildly dehydrated and he is being gently rehydrated with IV fluids.  His sodium is improving with his hydration. 6. Type 2 diabetes mellitus-he is being treated with supplemental sliding scale coverage.  Holding home glipizide.  Monitor for hypoglycemia. 7. Hypothyroidism-he has been resumed on home levothyroxine.  DVT prophylaxis: Apixaban Code Status: DNR Family Communication: Son was updated at bedside Disposition Plan: Pending PT OT evaluation   Consultants: Neurology  Procedures:    Antimicrobials:     Subjective: Patient reports that his speech seems to be back to baseline at this time.  He is aware that he is in the hospital.  He has no other specific complaints  Objective: Vitals:   02/03/19 1000 02/03/19 1030 02/03/19 1100 02/03/19 1130  BP: (!)  158/61 (!) 139/51 (!) 151/59 (!) 135/53  Pulse: 62 (!) 58 (!) 57 (!) 55  Resp: 14 13 14 13   SpO2: 96% 97% 99% 99%  Weight:      Height:        Intake/Output Summary (Last 24 hours) at 02/03/2019 1225 Last data filed at 02/03/2019 1011 Gross per 24 hour  Intake 1372.5 ml  Output 1640 ml  Net -267.5 ml   Filed Weights   02/02/19 1916  Weight: 85.3 kg     REVIEW OF SYSTEMS  As per history otherwise all reviewed and reported negative  Exam:  General exam: Elderly male lying in the bed he is awake and alert he is in no apparent distress. Respiratory system: Clear. No increased work of breathing. Cardiovascular system: Normal S1 & S2 heard. No JVD, murmurs, gallops, clicks or pedal edema. Gastrointestinal system: Abdomen is nondistended, soft and nontender. Normal bowel sounds heard. Central nervous system: Alert and oriented. No focal neurological deficits. Extremities: no CCE.  Data Reviewed: Basic Metabolic Panel: Recent Labs  Lab 02/02/19 1926 02/02/19 1933 02/03/19 0442  NA 127* 129* 129*  K 3.8 3.8 3.2*  CL 90* 91* 91*  CO2 26  --  25  GLUCOSE 82 77 111*  BUN 23 22 23   CREATININE 1.16 1.30* 1.33*  CALCIUM 8.7*  --  8.5*   Liver Function Tests: Recent Labs  Lab 02/02/19 1926  AST 26  ALT 20  ALKPHOS 61  BILITOT 0.6  PROT 7.4  ALBUMIN 3.4*   No results for input(s): LIPASE, AMYLASE in the last 168 hours. No results for input(s): AMMONIA in the last 168 hours. CBC: Recent Labs  Lab 02/02/19 1926 02/02/19 1933  WBC 7.7  --   NEUTROABS 3.7  --   HGB 9.6* 10.5*  HCT 29.5* 31.0*  MCV 96.7  --   PLT 382  --    Cardiac Enzymes: No results for input(s): CKTOTAL, CKMB, CKMBINDEX, TROPONINI in the last 168 hours. CBG (last 3)  Recent Labs    02/02/19 1926 02/03/19 0940  GLUCAP 73 91   No results found for this or any previous visit (from the past 240 hour(s)).   Studies: MR ANGIO HEAD WO CONTRAST  Result Date: 02/02/2019 CLINICAL DATA:   Encephalopathy EXAM: MRI HEAD WITHOUT CONTRAST MRA HEAD WITHOUT CONTRAST TECHNIQUE: Multiplanar, multiecho pulse sequences of the brain and surrounding structures were obtained without intravenous contrast. Angiographic images of the head were obtained using MRA technique without contrast. COMPARISON:  None. FINDINGS: The examination was discontinued prior to completion due to patient inability to lie flat secondary to kyphosis. Sagittal T1-weighted imaging, axial and coronal diffusion-weighted imaging and axial T2-weighted imaging were acquired. MRI HEAD FINDINGS BRAIN: There is no acute infarct, acute hemorrhage or extra-axial collection. The white matter signal is normal for the patient's age. The cerebral and cerebellar volume are age-appropriate. There is no hydrocephalus. The midline structures are normal. SKULL AND UPPER CERVICAL SPINE: The visualized skull base, calvarium, upper cervical spine and extracranial soft tissues are normal. SINUSES/ORBITS: No fluid levels or advanced mucosal thickening. No mastoid or middle ear effusion. There are bilateral lens replacements. MRA HEAD FINDINGS POSTERIOR CIRCULATION: --Basilar artery: Normal. --Posterior cerebral arteries: Normal. Both originate from the basilar artery. --Superior cerebellar arteries: Normal. --Inferior cerebellar arteries: Normal anterior and posterior inferior cerebellar arteries. ANTERIOR CIRCULATION: --Intracranial internal carotid arteries: Normal. --Anterior cerebral arteries: Normal. Both A1 segments are present. Patent anterior communicating artery. --Middle cerebral arteries: Normal. --Posterior communicating arteries: Absent bilaterally. IMPRESSION: 1. Truncated examination due to patient inability to lie flat secondary to kyphosis. 2. No acute intracranial abnormality. 3. Normal intracranial MRA. Electronically Signed   By: Ulyses Jarred M.D.   On: 02/02/2019 22:07   MR BRAIN WO CONTRAST  Result Date: 02/02/2019 CLINICAL DATA:   Encephalopathy EXAM: MRI HEAD WITHOUT CONTRAST MRA HEAD WITHOUT CONTRAST TECHNIQUE: Multiplanar, multiecho pulse sequences of the brain and surrounding structures were obtained without intravenous contrast. Angiographic images of the head were obtained using MRA technique without contrast. COMPARISON:  None. FINDINGS: The examination was discontinued prior to completion due to patient inability to lie flat secondary to kyphosis. Sagittal T1-weighted imaging, axial and coronal diffusion-weighted imaging and axial T2-weighted imaging were acquired. MRI HEAD FINDINGS BRAIN: There is no acute infarct, acute hemorrhage or extra-axial collection. The white matter signal is normal for the patient's age. The cerebral and cerebellar volume are age-appropriate. There is no hydrocephalus. The midline structures are normal. SKULL AND UPPER CERVICAL SPINE: The visualized skull base, calvarium, upper cervical spine and extracranial soft tissues are normal. SINUSES/ORBITS: No fluid levels or advanced mucosal thickening. No mastoid or middle ear effusion. There are bilateral lens replacements. MRA HEAD FINDINGS POSTERIOR CIRCULATION: --Basilar artery: Normal. --Posterior cerebral arteries: Normal. Both originate from the basilar artery. --Superior cerebellar arteries: Normal. --Inferior cerebellar arteries: Normal anterior and posterior inferior cerebellar arteries. ANTERIOR CIRCULATION: --Intracranial internal carotid arteries: Normal. --Anterior cerebral arteries: Normal. Both A1 segments are present. Patent anterior communicating artery. --Middle cerebral arteries: Normal. --Posterior communicating arteries: Absent bilaterally. IMPRESSION: 1. Truncated examination due to patient inability to lie flat secondary to kyphosis. 2. No acute intracranial abnormality. 3. Normal intracranial  MRA. Electronically Signed   By: Ulyses Jarred M.D.   On: 02/02/2019 22:07   US Carotid Bilateral  Result Date: 02/03/2019 CLINICAL DATA:  TIA  symptoms, hypertension, syncope, EXAM: BILATERAL CAROTID DUPLEX ULTRASOUND TECHNIQUE: Pearline Cables scale imaging, color Doppler and duplex ultrasound were performed of bilateral carotid and vertebral arteries in the neck. COMPARISON:  None. FINDINGS: Criteria: Quantification of carotid stenosis is based on velocity parameters that correlate the residual internal carotid diameter with NASCET-based stenosis levels, using the diameter of the distal internal carotid lumen as the denominator for stenosis measurement. The following velocity measurements were obtained: RIGHT ICA: 180/23 cm/sec CCA: 09/60 cm/sec SYSTOLIC ICA/CCA RATIO:  2.0 ECA: 93 cm/sec LEFT ICA: 157/28 cm/sec CCA: 45/4 cm/sec SYSTOLIC ICA/CCA RATIO:  2.7 ECA: 237 cm/sec RIGHT CAROTID ARTERY: Moderate heterogeneous and calcified right carotid bifurcation atherosclerosis extends into the proximal ICA. Proximal ICA mild velocity elevation measures 180/28 with mild turbulent flow. Right ICA stenosis estimated at 50-69% by ultrasound criteria. RIGHT VERTEBRAL ARTERY:  Antegrade LEFT CAROTID ARTERY: Similar moderate heterogeneous calcified left carotid bifurcation atherosclerosis. Mild left ICA velocity elevation measuring 157/58 centimeters/second. Minor turbulent flow. Degree of stenosis also estimated at 50-69% by ultrasound criteria. LEFT VERTEBRAL ARTERY:  Antegrade IMPRESSION: Moderate bilateral ICA stenoses estimated at 50-69% by ultrasound criteria. Patent antegrade vertebral flow bilaterally Electronically Signed   By: [name].  Shick M.D.   On: 02/03/2019 10:35   ECHOCARDIOGRAM COMPLETE  Result Date: 02/03/2019   ECHOCARDIOGRAM REPORT   Patient Name:   [name] Date of Exam: 02/03/2019 Medical Rec #:  [id number]      Height:       71.0 in Accession #:    [id number]     Weight:       188.1 lb Date of Birth:  1927-04-16      BSA:          2.05 m Patient Age:    31 years       BP:           147/54 mmHg Patient Gender: M              HR:           59 bpm. Exam  Location:  [facility] Procedure: 2D Echo, Cardiac Doppler and Color Doppler Indications:    TIA 435.9 / G45.9  History:        Patient has prior history of Echocardiogram examinations, most                 recent 08/27/2018. CHF, Arrythmias:Atrial Fibrillation; Risk                 Factors:Hypertension, Dyslipidemia and Diabetes. H/O Moderate                 aortic stenosis.  Sonographer:    Alvino Chapel RCS Referring Phys: [id number] George  1. Left ventricular ejection fraction, by visual estimation, is 60 to 65%. The left ventricle has normal function. There is moderately increased left ventricular hypertrophy.  2. Elevated left ventricular end-diastolic pressure.  3. Left ventricular diastolic parameters are consistent with Grade I diastolic dysfunction (impaired relaxation).  4. The left ventricle has no regional wall motion abnormalities.  5. Global right ventricle has normal systolic function.The right ventricular size is mildly enlarged. No increase in right ventricular wall thickness.  6. Left atrial size was normal.  7. Right atrial size was normal.  8. Mild mitral annular calcification.  9.  The mitral valve is grossly normal. Mild mitral valve regurgitation. 10. The tricuspid valve is grossly normal. 11. The tricuspid valve is grossly normal. Tricuspid valve regurgitation is mild. 12. The aortic valve is tricuspid. Aortic valve regurgitation is not visualized. Moderate to severe aortic valve stenosis. 13. There is severe calcifcation of the aortic valve. 14. The pulmonic valve was not well visualized. Pulmonic valve regurgitation is not visualized. 15. Normal pulmonary artery systolic pressure. 16. The inferior vena cava is normal in size with greater than 50% respiratory variability, suggesting right atrial pressure of 3 mmHg. 17. The interatrial septum was not well visualized. FINDINGS  Left Ventricle: Left ventricular ejection fraction, by visual estimation, is 60 to 65%. The left  ventricle has normal function. The left ventricle has no regional wall motion abnormalities. The left ventricular internal cavity size was the left ventricle is normal in size. There is moderately increased left ventricular hypertrophy. Concentric left ventricular hypertrophy. Left ventricular diastolic parameters are consistent with Grade I diastolic dysfunction (impaired relaxation). Elevated left  ventricular end-diastolic pressure. Right Ventricle: The right ventricular size is mildly enlarged. No increase in right ventricular wall thickness. Global RV systolic function is has normal systolic function. The tricuspid regurgitant velocity is 2.28 m/s, and with an assumed right atrial  pressure of 3 mmHg, the estimated right ventricular systolic pressure is normal at 23.7 mmHg. Left Atrium: Left atrial size was normal in size. Right Atrium: Right atrial size was normal in size Pericardium: There is no evidence of pericardial effusion. Mitral Valve: The mitral valve is grossly normal. Mild mitral annular calcification. Mild mitral valve regurgitation. Tricuspid Valve: The tricuspid valve is grossly normal. Tricuspid valve regurgitation is mild. Aortic Valve: The aortic valve is tricuspid. Aortic valve regurgitation is not visualized. Moderate to severe aortic stenosis is present. There is severe calcifcation of the aortic valve. Aortic valve mean gradient measures 31.5 mmHg. Aortic valve peak gradient measures 49.4 mmHg. Aortic valve area, by VTI measures 0.76 cm. Pulmonic Valve: The pulmonic valve was not well visualized. Pulmonic valve regurgitation is not visualized. Pulmonic regurgitation is not visualized. Aorta: The aortic root is normal in size and structure. Venous: The inferior vena cava is normal in size with greater than 50% respiratory variability, suggesting right atrial pressure of 3 mmHg. IAS/Shunts: The interatrial septum was not well visualized.  LEFT VENTRICLE PLAX 2D LVIDd:         3.78 cm        Diastology LVIDs:         2.49 cm       LV e' lateral:   5.55 cm/s LV PW:         1.30 cm       LV E/e' lateral: 16.0 LV IVS:        1.41 cm       LV e' medial:    5.55 cm/s LVOT diam:     1.90 cm       LV E/e' medial:  16.0 LV SV:         39 ml LV SV Index:   18.80 LVOT Area:     2.84 cm  LV Volumes (MOD) LV area d, A2C:    21.20 cm LV area d, A4C:    23.50 cm LV area s, A2C:    11.80 cm LV area s, A4C:    11.20 cm LV major d, A2C:   7.01 cm LV major d, A4C:   7.25 cm LV major s, A2C:  5.50 cm LV major s, A4C:   5.60 cm LV vol d, MOD A2C: 53.9 ml LV vol d, MOD A4C: 62.3 ml LV vol s, MOD A2C: 21.6 ml LV vol s, MOD A4C: 18.8 ml LV SV MOD A2C:     32.3 ml LV SV MOD A4C:     62.3 ml LV SV MOD BP:      38.5 ml RIGHT VENTRICLE RV S prime:     13.70 cm/s TAPSE (M-mode): 2.4 cm LEFT ATRIUM             Index       RIGHT ATRIUM           Index LA diam:        3.50 cm 1.70 cm/m  RA Area:     13.60 cm LA Vol (A2C):   73.0 ml 35.54 ml/m RA Volume:   33.30 ml  16.21 ml/m LA Vol (A4C):   63.4 ml 30.87 ml/m LA Biplane Vol: 69.3 ml 33.74 ml/m  AORTIC VALVE AV Area (Vmax):    0.82 cm AV Area (Vmean):   0.79 cm AV Area (VTI):     0.76 cm AV Vmax:           351.50 cm/s AV Vmean:          269.500 cm/s AV VTI:            1.011 m AV Peak Grad:      49.4 mmHg AV Mean Grad:      31.5 mmHg LVOT Vmax:         102.00 cm/s LVOT Vmean:        75.200 cm/s LVOT VTI:          0.271 m LVOT/AV VTI ratio: 0.27  AORTA Ao Root diam: 3.40 cm MITRAL VALVE                        TRICUSPID VALVE MV Area (PHT): 2.34 cm             TR Peak grad:   20.7 mmHg MV PHT:        93.96 msec           TR Vmax:        294.00 cm/s MV Decel Time: 324 msec MV E velocity: 88.80 cm/s 103 cm/s  SHUNTS MV A velocity: 99.40 cm/s 70.3 cm/s Systemic VTI:  0.27 m MV E/A ratio:  0.89       1.5       Systemic Diam: 1.90 cm  Kate Sable MD Electronically signed by Kate Sable MD Signature Date/Time: 02/03/2019/9:52:13 AM    Final    CT HEAD CODE STROKE  WO CONTRAST  Result Date: 02/02/2019 CLINICAL DATA:  Code stroke.  Speech difficulty. EXAM: CT HEAD WITHOUT CONTRAST TECHNIQUE: Contiguous axial images were obtained from the base of the skull through the vertex without intravenous contrast. COMPARISON:  None. FINDINGS: Brain: Mild atrophy. Negative for hydrocephalus. Patchy white matter hypodensity bilaterally is mild and likely chronic. No acute cortical infarct or mass Small extra-axial fluid collections bilaterally which are slightly denser than CSF. Right-sided fluid collection measures approximately 5 mm and left-sided fluid collection measures approximately 6 mm in thickness. These are likely chronic subdural hematomas. No midline shift. Vascular: Atherosclerotic calcification in the carotid and vertebral arteries. Negative for hyperdense vessel Skull: Negative for skull fracture or lesion Sinuses/Orbits: Paranasal sinuses clear. Bilateral cataract surgery. Other: None ASPECTS [facility] Stroke Program  Early CT Score) - Ganglionic level infarction (caudate, lentiform nuclei, internal capsule, insula, M1-M3 cortex): 7 - Supraganglionic infarction (M4-M6 cortex): 3 Total score (0-10 with 10 being normal): 10 IMPRESSION: 1. Negative for acute infarct 2. ASPECTS is 10 3. Small low-density extra-axial fluid collections bilaterally most likely chronic subdural hematomas. No high-density acute hemorrhage and no midline shift. 4. These results were called by telephone at the time of interpretation on 02/02/2019 at 7:46 pm to provider [facility] , who verbally acknowledged these results. Electronically Signed   By: Franchot Gallo M.D.   On: 02/02/2019 19:46     Scheduled Meds: .  stroke: mapping our early stages of recovery book   Does not apply Once  . apixaban  5 mg Oral BID  . aspirin EC  81 mg Oral Daily  . atorvastatin  40 mg Oral q1800  . clopidogrel  75 mg Oral Daily  . insulin aspart  0-9 Units Subcutaneous TID WC  . levothyroxine  50 mcg Oral  Q0600   Continuous Infusions: . sodium chloride      Principal Problem:   Transient speech disturbance Active Problems:   Essential hypertension, benign   Chronic diastolic CHF (congestive heart failure) (HCC)   PAF (paroxysmal atrial fibrillation) (HCC)   Type 2 diabetes mellitus with vascular disease (HCC)   Hyponatremia   Essential hypertension   Normocytic anemia   Pressure injury of skin   Time spent:   Irwin Brakeman, MD [facility] 02/03/2019, 12:25 PM    LOS: 0 days  How to contact the [facility] Attending or Consulting provider [place] or covering provider during after hours [place], for this patient?  1. Check the care team in [facility] and look for a) attending/consulting [facility] provider listed and b) the [facility] team listed 2. Log into [email or web address] and use [place]'s universal password to access. If you do not have the password, please contact the hospital operator. 3. Locate the [facility] provider you are looking for under [facility] and page to a number that you can be directly reached. 4. If you still have difficulty reaching the provider, please page the [facility] (Director on Call) for the Hospitalists listed on amion for assistance.

## 2019-02-03 NOTE — Progress Notes (Signed)
*  PRELIMINARY RESULTS* Echocardiogram 2D Echocardiogram has been performed.  [name] 02/03/2019, 9:28 AM

## 2019-02-03 NOTE — ED Notes (Signed)
Ultrasound at bedside for carotid study.

## 2019-02-03 NOTE — Evaluation (Signed)
Physical Therapy Evaluation Patient Details Name: [name] MRN: [id number] DOB: 02/23/1927 Today's Date: 02/03/2019   History of Present Illness  [name] is a 86 y.o. male with medical history significant for type 2 diabetes mellitus, hypertension, chronic diastolic CHF, hypothyroidism, paroxysmal atrial fibrillation on Eliquis, and recent revision of left BKA, now presenting to the emergency department with speech difficulty.  He is accompanied by his son who assists with the history.  The patient was reportedly in his usual state when he laid down for a nap at approximately 5:30 PM but was noted to have dysarthria and nonsensical speech at roughly 6:10 PM.  Family member who was a nurse came to the house to evaluate the patient and called EMS.  Patient denies any recent fevers, chills, cough, shortness of breath, chest pain, or palpitations.  There was no fall or trauma.    Clinical Impression  Patient functioning near baseline for functional mobility and gait, had difficulty sitting up at bedside due to generalized weakness, had to use BUE to support self while seated at EOB due fair/poor trunk control, unable to attempt sit to stand secondary RLE BKA prosthetic leg not in room.  Patient put back to bed with Mod assist to reposition.  Patient will benefit from continued physical therapy in hospital and recommended venue below to increase strength, balance, endurance for safe ADLs and gait.     Follow Up Recommendations Home health PT;Supervision - Intermittent;Supervision for mobility/OOB    Equipment Recommendations  None recommended by PT    Recommendations for Other Services       Precautions / Restrictions Precautions Precautions: Fall Restrictions Weight Bearing Restrictions: Yes LLE Weight Bearing: Non weight bearing      Mobility  Bed Mobility Overal bed mobility: Needs Assistance Bed Mobility: Supine to Sit;Sit to Supine     Supine to sit: Mod assist;Max  assist Sit to supine: Mod assist   General bed mobility comments: increased time, labored movement  Transfers                    Ambulation/Gait                Stairs            Wheelchair Mobility    Modified Rankin (Stroke Patients Only)       Balance Overall balance assessment: Needs assistance Sitting-balance support: Bilateral upper extremity supported;Feet unsupported Sitting balance-Leahy Scale: Poor Sitting balance - Comments: fair/poor seated at bedside                                     Pertinent Vitals/Pain Pain Assessment: No/denies pain    Home Living Family/patient expects to be discharged to:: Private residence Living Arrangements: Children Available Help at Discharge: Family;Available 24 hours/day Type of Home: House Home Access: Ramped entrance     Home Layout: One level Home Equipment: Walker - 2 wheels;Bedside commode;Shower seat;Wheelchair - manual;Hospital bed Additional Comments: has steady mechanical lift    Prior Function Level of Independence: Needs assistance   Gait / Transfers Assistance Needed: non-ambulatory, uses steady lift with RLE prosthetic leg on for transfers, 1 person assistance for bed mobility  ADL's / Homemaking Assistance Needed: assisted by family        Hand Dominance   Dominant Hand: Right    Extremity/Trunk Assessment   Upper Extremity Assessment Upper Extremity Assessment: Defer to  OT evaluation    Lower Extremity Assessment Lower Extremity Assessment: Generalized weakness    Cervical / Trunk Assessment Cervical / Trunk Assessment: Normal  Communication   Communication: HOH  Cognition Arousal/Alertness: Awake/alert Behavior During Therapy: WFL for tasks assessed/performed Overall Cognitive Status: Within Functional Limits for tasks assessed                                        General Comments      Exercises     Assessment/Plan    PT  Assessment Patient needs continued PT services  PT Problem List Decreased strength;Decreased activity tolerance;Decreased balance;Decreased mobility       PT Treatment Interventions Functional mobility training;Therapeutic activities;Therapeutic exercise;Balance training;Patient/family education;Wheelchair mobility training    PT Goals (Current goals can be found in the Care Plan section)  Acute Rehab PT Goals Patient Stated Goal: return home with family to assist PT Goal Formulation: With patient Time For Goal Achievement: 02/10/19 Potential to Achieve Goals: Good    Frequency Min 2X/week   Barriers to discharge        Co-evaluation               AM-PAC PT "6 Clicks" Mobility  Outcome Measure Help needed turning from your back to your side while in a flat bed without using bedrails?: A Lot Help needed moving from lying on your back to sitting on the side of a flat bed without using bedrails?: A Lot Help needed moving to and from a bed to a chair (including a wheelchair)?: Total Help needed standing up from a chair using your arms (e.g., wheelchair or bedside chair)?: Total Help needed to walk in hospital room?: Total Help needed climbing 3-5 steps with a railing? : Total 6 Click Score: 8    End of Session   Activity Tolerance: Patient tolerated treatment well;Patient limited by fatigue Patient left: in bed;with call bell/phone within reach Nurse Communication: Mobility status PT Visit Diagnosis: Other abnormalities of gait and mobility (R26.89);Muscle weakness (generalized) (M62.81);Difficulty in walking, not elsewhere classified (R26.2)    Time: 5830-9407 PT Time Calculation (min) (ACUTE ONLY): 22 min   Charges:   PT Evaluation $PT Eval Moderate Complexity: 1 Mod PT Treatments $Therapeutic Activity: 23-37 mins        4:37 PM, 02/03/19 Lonell Grandchild, MPT Physical Therapist with [facility] [phone] office [phone] mobile phone

## 2019-02-03 NOTE — Plan of Care (Signed)
  Problem: Acute Rehab PT Goals(only PT should resolve) Goal: Pt Will Go Supine/Side To Sit Outcome: Progressing Flowsheets (Taken 02/03/2019 1640) Pt will go Supine/Side to Sit:  with minimal assist  with moderate assist Goal: Pt Will Go Sit To Supine/Side Outcome: Progressing Flowsheets (Taken 02/03/2019 1640) Pt will go Sit to Supine/Side:  with minimal assist  with moderate assist Goal: Patient Will Perform Sitting Balance Outcome: Progressing Flowsheets (Taken 02/03/2019 1640) Patient will perform sitting balance: with min guard assist Goal: Pt Will Transfer Bed To Chair/Chair To Bed Outcome: Progressing Flowsheets (Taken 02/03/2019 1640) Pt will Transfer Bed to Chair/Chair to Bed: with mod assist Note: Using sliding board   4:40 PM, 02/03/19 Lonell Grandchild, MPT Physical Therapist with [facility] [phone] office [phone] mobile phone

## 2019-02-03 NOTE — ED Notes (Signed)
Ultrasound at bedside for echocardiogram.

## 2019-02-04 ENCOUNTER — Other Ambulatory Visit: Payer: Self-pay | Admitting: *Deleted

## 2019-02-04 DIAGNOSIS — G459 Transient cerebral ischemic attack, unspecified: Principal | ICD-10-CM

## 2019-02-04 LAB — CBC WITH DIFFERENTIAL/PLATELET
Abs Immature Granulocytes: 0.01 10*3/uL (ref 0.00–0.07)
Basophils Absolute: 0.1 10*3/uL (ref 0.0–0.1)
Basophils Relative: 1 %
Eosinophils Absolute: 0.5 10*3/uL (ref 0.0–0.5)
Eosinophils Relative: 6 %
HCT: 27.9 % — ABNORMAL LOW (ref 39.0–52.0)
Hemoglobin: 9.1 g/dL — ABNORMAL LOW (ref 13.0–17.0)
Immature Granulocytes: 0 %
Lymphocytes Relative: 41 %
Lymphs Abs: 3 10*3/uL (ref 0.7–4.0)
MCH: 31.6 pg (ref 26.0–34.0)
MCHC: 32.6 g/dL (ref 30.0–36.0)
MCV: 96.9 fL (ref 80.0–100.0)
Monocytes Absolute: 0.6 10*3/uL (ref 0.1–1.0)
Monocytes Relative: 8 %
Neutro Abs: 3.3 10*3/uL (ref 1.7–7.7)
Neutrophils Relative %: 44 %
Platelets: 376 10*3/uL (ref 150–400)
RBC: 2.88 MIL/uL — ABNORMAL LOW (ref 4.22–5.81)
RDW: 16.7 % — ABNORMAL HIGH (ref 11.5–15.5)
WBC: 7.4 10*3/uL (ref 4.0–10.5)
nRBC: 0 % (ref 0.0–0.2)

## 2019-02-04 LAB — COMPREHENSIVE METABOLIC PANEL
ALT: 15 U/L (ref 0–44)
AST: 21 U/L (ref 15–41)
Albumin: 3.1 g/dL — ABNORMAL LOW (ref 3.5–5.0)
Alkaline Phosphatase: 43 U/L (ref 38–126)
Anion gap: 9 (ref 5–15)
BUN: 23 mg/dL (ref 8–23)
CO2: 25 mmol/L (ref 22–32)
Calcium: 8.8 mg/dL — ABNORMAL LOW (ref 8.9–10.3)
Chloride: 97 mmol/L — ABNORMAL LOW (ref 98–111)
Creatinine, Ser: 1.17 mg/dL (ref 0.61–1.24)
GFR calc Af Amer: >60 mL/min (ref >60)
GFR calc non Af Amer: 55 mL/min — ABNORMAL LOW (ref >60)
Glucose, Bld: 105 mg/dL — ABNORMAL HIGH (ref 70–99)
Potassium: 3.8 mmol/L (ref 3.5–5.1)
Sodium: 131 mmol/L — ABNORMAL LOW (ref 135–145)
Total Bilirubin: 0.4 mg/dL (ref 0.3–1.2)
Total Protein: 6.5 g/dL (ref 6.5–8.1)

## 2019-02-04 LAB — MAGNESIUM: Magnesium: 2.1 mg/dL (ref 1.7–2.4)

## 2019-02-04 LAB — GLUCOSE, CAPILLARY
Glucose-Capillary: 93 mg/dL (ref 70–99)
Glucose-Capillary: 115 mg/dL — ABNORMAL HIGH (ref 70–99)

## 2019-02-04 NOTE — Progress Notes (Signed)
SLP Cancellation Note  Patient Details Name: [name] MRN: [id number] DOB: 10-Feb-1927   Cancelled treatment:       Reason Eval/Treat Not Completed: SLP screened, no needs identified, will sign off; SLP screened Pt in room. Pt denies any changes in swallowing, speech, language, or cognition. MRI negative for acute changes. SLE will be deferred at this time. Pt's son present and reports that Pt is back to baseline. Pt with h/o mild word finding deficits and Pt is HOH.  Reconsult if indicated. SLP will sign off.   Thank you,  Genene Churn, [place] 02/04/2019, 10:17 AM

## 2019-02-04 NOTE — TOC Transition Note (Signed)
Transition of Care [facility]) - CM/SW Discharge Note   Patient Details  Name: SHAMIR TUZZOLINO MRN: [id number] Date of Birth: 1927/06/21  [facility]) CM/SW Contact:  Alric Geise, Chauncey [place], RN Phone Number: 02/04/2019, 12:50 PM   Clinical Narrative:   Patient discharged home today. Linda of [facility] notified, home health continuation orders placed. F/u appt made.     Final next level of care: [place] Barriers to Discharge: Barriers Resolved    HH Arranged: PT, RN, Nurse's Aide, Social Work [employer] Agency: [place] ([place]) Date [place]: 02/04/19 Time [place]: 1250 Representative spoke with at [place]: [place] ([place]) Interventions     Readmission Risk Interventions Readmission Risk Prevention Plan 12/22/2018 11/26/2018 11/21/2018  Transportation Screening Complete Complete Complete  PCP or Specialist Appt within 3-5 Days - - -  Not Complete comments - - -  [place] or [place] or Home Care Consult comments - - -  Social Work Consult for [place] Planning/Counseling - - -  [place] - - -  Medication Review (RN Care Manager) Complete Complete Referral to Pharmacy  PCP or Specialist appointment within 3-5 days of discharge Complete Complete Complete  [place] or [place] Complete Complete Complete  SW Recovery Care/Counseling Consult Complete Complete Complete  Palliative Care Screening Not Applicable Not Applicable Not [place] Patient Refused Not Applicable Not Complete  Some recent data might be hidden

## 2019-02-04 NOTE — Progress Notes (Signed)
Old drainage noted to bandage on left BKA. Stitches intact, small amount of bloody drainage noted. Applied non-adherent bandage, wrapped with gauze and re-applied black shrinker sock.

## 2019-02-04 NOTE — Progress Notes (Signed)
Nsg Discharge Note  Admit Date:  02/02/2019 Discharge date: 02/04/2019   [name] to be D/C'd home per MD order.  AVS completed.  Copy for chart, and copy for patient signed, and dated. Patient/caregiver able to verbalize understanding.  Discharge Medication: Allergies as of 02/04/2019      Reactions   Iodinated Diagnostic Agents    Other reaction(s): Fever   Dye Fdc Red [red Dye] Hives   Tolerated red Docusate, Niferex      Medication List    STOP taking these medications   acetaminophen 500 MG tablet Commonly known as: TYLENOL   nutrition supplement (JUVEN) Pack   senna-docusate 8.6-50 MG tablet Commonly known as: Senokot-S   traMADol 50 MG tablet Commonly known as: ULTRAM     TAKE these medications   aspirin EC 81 MG tablet Take 81 mg by mouth daily.   atorvastatin 40 MG tablet Commonly known as: LIPITOR Take 1 tablet (40 mg total) by mouth daily. What changed: when to take this   clopidogrel 75 MG tablet Commonly known as: PLAVIX Take 1 tablet (75 mg total) by mouth daily with breakfast.   diclofenac Sodium 1 % Gel Commonly known as: VOLTAREN Apply 2 g topically 4 (four) times daily. What changed:   when to take this  reasons to take this   docusate sodium 100 MG capsule Commonly known as: COLACE Take 1 capsule (100 mg total) by mouth 2 (two) times daily.   dorzolamide-timolol 22.3-6.8 MG/ML ophthalmic solution Commonly known as: COSOPT Place 1 drop into both eyes 2 (two) times daily.   Eliquis 5 MG Tabs tablet Generic drug: apixaban Take 5 mg by mouth 2 (two) times daily.   famotidine 20 MG tablet Commonly known as: PEPCID Take 1 tablet (20 mg total) by mouth daily.   ferrous sulfate 325 (65 FE) MG tablet Take 325 mg by mouth 2 (two) times daily with a meal.   finasteride 5 MG tablet Commonly known as: PROSCAR Take 1 tablet (5 mg total) by mouth every evening.   FISH OIL PO Take 1 capsule by mouth daily.   furosemide 40 MG  tablet Commonly known as: LASIX Take 1 tablet (40 mg total) by mouth daily.   glipiZIDE 5 MG tablet Commonly known as: GLUCOTROL Take 5 mg by mouth 2 (two) times daily.   GLUCOSAMINE CHOND MSM FORMULA PO Take 1 tablet by mouth 2 (two) times daily.   levothyroxine 50 MCG tablet Commonly known as: SYNTHROID Take 1 tablet (50 mcg total) by mouth daily before breakfast.   methocarbamol 500 MG tablet Commonly known as: ROBAXIN Take 1 tablet (500 mg total) by mouth every 6 (six) hours as needed for muscle spasms.   oxyCODONE-acetaminophen 5-325 MG tablet Commonly known as: Percocet Take 1 tablet by mouth every 4 (four) hours as needed for severe pain.   polyethylene glycol 17 g packet Commonly known as: MiraLax Take 17 g by mouth daily as needed for moderate constipation.   potassium chloride 10 MEQ tablet Commonly known as: KLOR-CON Take 1 tablet (10 mEq total) by mouth daily.   pyridoxine 100 MG tablet Commonly known as: B-6 Take 100 mg by mouth daily.   tamsulosin 0.4 MG Caps capsule Commonly known as: FLOMAX Take 1 capsule (0.4 mg total) by mouth 2 (two) times daily.   vitamin C 500 MG tablet Commonly known as: ASCORBIC ACID Take 500 mg by mouth daily.   vitamin E 180 MG (400 UNITS) capsule Take 400 Units  by mouth daily.       Discharge Assessment: Vitals:   02/04/19 0000 02/04/19 0402  BP: (!) 145/54 (!) 156/58  Pulse: (!) 54 (!) 57  Resp: 16 16  Temp: 98 F (36.7 C) 98 F (36.7 C)  SpO2: 97% 97%   Skin clean, dry and intact without evidence of skin break down, no evidence of skin tears noted. IV catheter discontinued intact. Site without signs and symptoms of complications - no redness or edema noted at insertion site, patient denies c/o pain - only slight tenderness at site.  Dressing with slight pressure applied.  D/c Instructions-Education: Discharge instructions given to patient/family with verbalized understanding. D/c education completed with  patient/family including follow up instructions, medication list, d/c activities limitations if indicated, with other d/c instructions as indicated by MD - patient able to verbalize understanding, all questions fully answered. Patient instructed to return to ED, call 911, or call MD for any changes in condition.  Patient escorted via [place], and D/C home via private auto.  Zachery Conch, RN 02/04/2019 1:02 PM

## 2019-02-04 NOTE — Patient Outreach (Addendum)
[facility] Telephone outreach post TIA.  PC consult?  Eulah Pont. Myrtie Neither, MSN, GNP-BC Gerontological Nurse Practitioner [facility] Care Management [phone]  Addendum: (late entry for 02/04/19)  Pt's son, Taelor Moncada, returned my call and reports his father is doing fair. He has an appt with Dr. Gerarda Fraction as a hospital follow up. Home health is ordered. Has all medications. No current needs for resolution today. I will call again next week.  Eulah Pont. Myrtie Neither, MSN, [facility] Gerontological Nurse Practitioner [facility] Care Management [phone]

## 2019-02-04 NOTE — Progress Notes (Signed)
OT Cancellation Note  Patient Details Name: [name] MRN: [id number] DOB: Sep 21, 1927   Cancelled Treatment:    Reason Eval/Treat Not Completed: OT screened, no needs identified, will sign off. Pt is at baseline with ADL completion, requiring set-up to max assist with ADLs. Pt reports family is with him 24/7 and assists as needed. Primarily uses lift for transfers and wheelchair for functional mobility. Pt is at baseline since previous OT evaluation on 01/22/19, no further acute OT services required at this time.   Guadelupe Sabin, OTR/L  [phone] 02/04/2019, 8:13 AM

## 2019-02-04 NOTE — Discharge Summary (Signed)
Physician Discharge Summary  [name]:[id number] DOB: 10/25/1927 DOA: 02/02/2019  PCP: Redmond School, MD  Admit date: 02/02/2019 Discharge date: 02/04/2019  Admitted From:   HOME  Disposition:  [place] and 24/7 SUPERVISION  Recommendations for Outpatient Follow-up:  1. Follow up with PCP in 2 weeks 2. Follow up with neurologist in 1 week 3. Follow up with orthopedist as scheduled  Home Health: PT, RN, SW   Discharge Condition: STABLE   CODE STATUS: DNR    Brief Hospitalization Summary: Please see all hospital notes, images, labs for full details of the hospitalization. ADMISSION HPI: [name] is a 86 y.o. male with medical history significant for type 2 diabetes mellitus, hypertension, chronic diastolic CHF, hypothyroidism, paroxysmal atrial fibrillation on Eliquis, and recent revision of left BKA, now presenting to the emergency department with speech difficulty.  He is accompanied by his son who assists with the history.  The patient was reportedly in his usual state when he laid down for a nap at approximately 5:30 PM but was noted to have dysarthria and nonsensical speech at roughly 6:10 PM.  Family member who was a nurse came to the house to evaluate the patient and called EMS.  Patient denies any recent fevers, chills, cough, shortness of breath, chest pain, or palpitations.  There was no fall or trauma.  ED Course: Upon arrival to the ED, patient is found to be bradycardic in the 50s, saturating 99% on room air, and with stable blood pressure.  EKG features sinus bradycardia with rate 56, PVC, first-degree AV nodal block, and RBBB.  Noncontrast head CT is negative for acute intracranial hemorrhage or infarction but notable for small low-density extra-axial fluid collections bilaterally that likely reflect chronic SDH without acute hemorrhage or midline shift.  Chemistry panel notable for sodium of 127, down from 133 earlier this month.  CBC features and  improved normocytic anemia with hemoglobin 9.6.  Teleneurology evaluated the patient in the ED and recommended admission for ongoing evaluation and management of possible CVA/TIA.  COVID-19 PCR screening test has not yet resulted.  Brief Admission Hx: 86 y.o.malewith medical history significant fortype 2 diabetes mellitus, hypertension, chronic diastolic CHF, hypothyroidism, paroxysmal atrial fibrillation on Eliquis, and recent revision of left BKA, now presenting to the emergency department with speech difficulty.   MDM/Assessment & Plan:   1. Expressive aphasia -RESOLVED.  Fortunately symptoms resolved shortly after arrival and he has not had any recurrence.  He was evaluated by telemetry neurology and they recommended a full TIA stroke work-up which was completed.   MRI with no findings of acute infarct.  PT evaluation recommended HHPT and patient is agreeable.  He has 24/7 supervision and support at home.  2. Paroxysmal atrial fibrillation-he remains in sinus rhythm and he is fully anticoagulated with apixaban.  He recently saw cardiology earlier this month and his medications have not been changed.  3. Hyponatremia-likely secondary to diuretics which are was held.  His sodium is improving with hydration.  Resume home lasix dose at discharge.   4. Anemia of chronic disease-he is recently postop hemoglobin is 9.6.  Follow. 5. Chronic diastolic CHF-he is mildly dehydrated and he is being gently rehydrated with IV fluids.  His sodium is improving with his hydration. 6. Type 2 diabetes mellitus-he is being treated with supplemental sliding scale coverage.  Holding home glipizide.  Monitor for hypoglycemia. 7. Hypothyroidism-he has been resumed on home levothyroxine.  DVT prophylaxis: Apixaban Code Status: DNR Family Communication:  Son was updated at bedside Disposition Plan: [facility]   Discharge Diagnoses:  Principal Problem:   Transient speech  disturbance Active Problems:   Essential hypertension, benign   Chronic diastolic CHF (congestive heart failure) (HCC)   PAF (paroxysmal atrial fibrillation) (HCC)   Type 2 diabetes mellitus with vascular disease (HCC)   Hyponatremia   Essential hypertension   Normocytic anemia   Pressure injury of skin   TIA (transient ischemic attack)   Discharge Instructions:  Allergies as of 02/04/2019      Reactions   Iodinated Diagnostic Agents    Other reaction(s): Fever   Dye Fdc Red [red Dye] Hives   Tolerated red Docusate, Niferex      Medication List    STOP taking these medications   acetaminophen 500 MG tablet Commonly known as: TYLENOL   nutrition supplement (JUVEN) Pack   senna-docusate 8.6-50 MG tablet Commonly known as: Senokot-S   traMADol 50 MG tablet Commonly known as: ULTRAM     TAKE these medications   aspirin EC 81 MG tablet Take 81 mg by mouth daily.   atorvastatin 40 MG tablet Commonly known as: LIPITOR Take 1 tablet (40 mg total) by mouth daily. What changed: when to take this   clopidogrel 75 MG tablet Commonly known as: PLAVIX Take 1 tablet (75 mg total) by mouth daily with breakfast.   diclofenac Sodium 1 % Gel Commonly known as: VOLTAREN Apply 2 g topically 4 (four) times daily. What changed:   when to take this  reasons to take this   docusate sodium 100 MG capsule Commonly known as: COLACE Take 1 capsule (100 mg total) by mouth 2 (two) times daily.   dorzolamide-timolol 22.3-6.8 MG/ML ophthalmic solution Commonly known as: COSOPT Place 1 drop into both eyes 2 (two) times daily.   Eliquis 5 MG Tabs tablet Generic drug: apixaban Take 5 mg by mouth 2 (two) times daily.   famotidine 20 MG tablet Commonly known as: PEPCID Take 1 tablet (20 mg total) by mouth daily.   ferrous sulfate 325 (65 FE) MG tablet Take 325 mg by mouth 2 (two) times daily with a meal.   finasteride 5 MG tablet Commonly known as: PROSCAR Take 1 tablet (5  mg total) by mouth every evening.   FISH OIL PO Take 1 capsule by mouth daily.   furosemide 40 MG tablet Commonly known as: LASIX Take 1 tablet (40 mg total) by mouth daily.   glipiZIDE 5 MG tablet Commonly known as: GLUCOTROL Take 5 mg by mouth 2 (two) times daily.   GLUCOSAMINE CHOND MSM FORMULA PO Take 1 tablet by mouth 2 (two) times daily.   levothyroxine 50 MCG tablet Commonly known as: SYNTHROID Take 1 tablet (50 mcg total) by mouth daily before breakfast.   methocarbamol 500 MG tablet Commonly known as: ROBAXIN Take 1 tablet (500 mg total) by mouth every 6 (six) hours as needed for muscle spasms.   oxyCODONE-acetaminophen 5-325 MG tablet Commonly known as: Percocet Take 1 tablet by mouth every 4 (four) hours as needed for severe pain.   polyethylene glycol 17 g packet Commonly known as: MiraLax Take 17 g by mouth daily as needed for moderate constipation.   potassium chloride 10 MEQ tablet Commonly known as: KLOR-CON Take 1 tablet (10 mEq total) by mouth daily.   pyridoxine 100 MG tablet Commonly known as: B-6 Take 100 mg by mouth daily.   tamsulosin 0.4 MG Caps capsule Commonly known  as: FLOMAX Take 1 capsule (0.4 mg total) by mouth 2 (two) times daily.   vitamin C 500 MG tablet Commonly known as: ASCORBIC ACID Take 500 mg by mouth daily.   vitamin E 180 MG (400 UNITS) capsule Take 400 Units by mouth daily.      Follow-up Information    Redmond School, MD. Schedule an appointment as soon as possible for a visit in 2 week(s).   Specialty: Internal Medicine Contact information: [address] [place] [address] [phone]        Pixie Casino, MD .   Specialty: Cardiology Contact information: [place] [address] [phone]        Phillips Odor, MD. Schedule an appointment as soon as possible for a visit in 1 week(s).   Specialty: Neurology Why: Hospital Follow Up  Contact  information: [address] [place] [address] [phone]          Allergies  Allergen Reactions  . Iodinated Diagnostic Agents     Other reaction(s): Fever  . Dye Fdc Red [Red Dye] Hives    Tolerated red Docusate, Niferex   Allergies as of 02/04/2019      Reactions   Iodinated Diagnostic Agents    Other reaction(s): Fever   Dye Fdc Red [red Dye] Hives   Tolerated red Docusate, Niferex      Medication List    STOP taking these medications   acetaminophen 500 MG tablet Commonly known as: TYLENOL   nutrition supplement (JUVEN) Pack   senna-docusate 8.6-50 MG tablet Commonly known as: Senokot-S   traMADol 50 MG tablet Commonly known as: ULTRAM     TAKE these medications   aspirin EC 81 MG tablet Take 81 mg by mouth daily.   atorvastatin 40 MG tablet Commonly known as: LIPITOR Take 1 tablet (40 mg total) by mouth daily. What changed: when to take this   clopidogrel 75 MG tablet Commonly known as: PLAVIX Take 1 tablet (75 mg total) by mouth daily with breakfast.   diclofenac Sodium 1 % Gel Commonly known as: VOLTAREN Apply 2 g topically 4 (four) times daily. What changed:   when to take this  reasons to take this   docusate sodium 100 MG capsule Commonly known as: COLACE Take 1 capsule (100 mg total) by mouth 2 (two) times daily.   dorzolamide-timolol 22.3-6.8 MG/ML ophthalmic solution Commonly known as: COSOPT Place 1 drop into both eyes 2 (two) times daily.   Eliquis 5 MG Tabs tablet Generic drug: apixaban Take 5 mg by mouth 2 (two) times daily.   famotidine 20 MG tablet Commonly known as: PEPCID Take 1 tablet (20 mg total) by mouth daily.   ferrous sulfate 325 (65 FE) MG tablet Take 325 mg by mouth 2 (two) times daily with a meal.   finasteride 5 MG tablet Commonly known as: PROSCAR Take 1 tablet (5 mg total) by mouth every evening.   FISH OIL PO Take 1 capsule by mouth daily.   furosemide 40 MG tablet Commonly known as:  LASIX Take 1 tablet (40 mg total) by mouth daily.   glipiZIDE 5 MG tablet Commonly known as: GLUCOTROL Take 5 mg by mouth 2 (two) times daily.   GLUCOSAMINE CHOND MSM FORMULA PO Take 1 tablet by mouth 2 (two) times daily.   levothyroxine 50 MCG tablet Commonly known as: SYNTHROID Take 1 tablet (50 mcg total) by mouth daily before breakfast.   methocarbamol 500 MG tablet Commonly known  as: ROBAXIN Take 1 tablet (500 mg total) by mouth every 6 (six) hours as needed for muscle spasms.   oxyCODONE-acetaminophen 5-325 MG tablet Commonly known as: Percocet Take 1 tablet by mouth every 4 (four) hours as needed for severe pain.   polyethylene glycol 17 g packet Commonly known as: MiraLax Take 17 g by mouth daily as needed for moderate constipation.   potassium chloride 10 MEQ tablet Commonly known as: KLOR-CON Take 1 tablet (10 mEq total) by mouth daily.   pyridoxine 100 MG tablet Commonly known as: B-6 Take 100 mg by mouth daily.   tamsulosin 0.4 MG Caps capsule Commonly known as: FLOMAX Take 1 capsule (0.4 mg total) by mouth 2 (two) times daily.   vitamin C 500 MG tablet Commonly known as: ASCORBIC ACID Take 500 mg by mouth daily.   vitamin E 180 MG (400 UNITS) capsule Take 400 Units by mouth daily.       Procedures/Studies: MR ANGIO HEAD WO CONTRAST  Result Date: 02/02/2019 CLINICAL DATA:  Encephalopathy EXAM: MRI HEAD WITHOUT CONTRAST MRA HEAD WITHOUT CONTRAST TECHNIQUE: Multiplanar, multiecho pulse sequences of the brain and surrounding structures were obtained without intravenous contrast. Angiographic images of the head were obtained using MRA technique without contrast. COMPARISON:  None. FINDINGS: The examination was discontinued prior to completion due to patient inability to lie flat secondary to kyphosis. Sagittal T1-weighted imaging, axial and coronal diffusion-weighted imaging and axial T2-weighted imaging were acquired. MRI HEAD FINDINGS BRAIN: There is no  acute infarct, acute hemorrhage or extra-axial collection. The white matter signal is normal for the patient's age. The cerebral and cerebellar volume are age-appropriate. There is no hydrocephalus. The midline structures are normal. SKULL AND UPPER CERVICAL SPINE: The visualized skull base, calvarium, upper cervical spine and extracranial soft tissues are normal. SINUSES/ORBITS: No fluid levels or advanced mucosal thickening. No mastoid or middle ear effusion. There are bilateral lens replacements. MRA HEAD FINDINGS POSTERIOR CIRCULATION: --Basilar artery: Normal. --Posterior cerebral arteries: Normal. Both originate from the basilar artery. --Superior cerebellar arteries: Normal. --Inferior cerebellar arteries: Normal anterior and posterior inferior cerebellar arteries. ANTERIOR CIRCULATION: --Intracranial internal carotid arteries: Normal. --Anterior cerebral arteries: Normal. Both A1 segments are present. Patent anterior communicating artery. --Middle cerebral arteries: Normal. --Posterior communicating arteries: Absent bilaterally. IMPRESSION: 1. Truncated examination due to patient inability to lie flat secondary to kyphosis. 2. No acute intracranial abnormality. 3. Normal intracranial MRA. Electronically Signed   By: Ulyses Jarred M.D.   On: 02/02/2019 22:07   MR BRAIN WO CONTRAST  Result Date: 02/02/2019 CLINICAL DATA:  Encephalopathy EXAM: MRI HEAD WITHOUT CONTRAST MRA HEAD WITHOUT CONTRAST TECHNIQUE: Multiplanar, multiecho pulse sequences of the brain and surrounding structures were obtained without intravenous contrast. Angiographic images of the head were obtained using MRA technique without contrast. COMPARISON:  None. FINDINGS: The examination was discontinued prior to completion due to patient inability to lie flat secondary to kyphosis. Sagittal T1-weighted imaging, axial and coronal diffusion-weighted imaging and axial T2-weighted imaging were acquired. MRI HEAD FINDINGS BRAIN: There is no acute  infarct, acute hemorrhage or extra-axial collection. The white matter signal is normal for the patient's age. The cerebral and cerebellar volume are age-appropriate. There is no hydrocephalus. The midline structures are normal. SKULL AND UPPER CERVICAL SPINE: The visualized skull base, calvarium, upper cervical spine and extracranial soft tissues are normal. SINUSES/ORBITS: No fluid levels or advanced mucosal thickening. No mastoid or middle ear effusion. There are bilateral lens replacements. MRA HEAD FINDINGS POSTERIOR CIRCULATION: --Basilar artery: Normal. --Posterior  cerebral arteries: Normal. Both originate from the basilar artery. --Superior cerebellar arteries: Normal. --Inferior cerebellar arteries: Normal anterior and posterior inferior cerebellar arteries. ANTERIOR CIRCULATION: --Intracranial internal carotid arteries: Normal. --Anterior cerebral arteries: Normal. Both A1 segments are present. Patent anterior communicating artery. --Middle cerebral arteries: Normal. --Posterior communicating arteries: Absent bilaterally. IMPRESSION: 1. Truncated examination due to patient inability to lie flat secondary to kyphosis. 2. No acute intracranial abnormality. 3. Normal intracranial MRA. Electronically Signed   By: Ulyses Jarred M.D.   On: 02/02/2019 22:07   US Carotid Bilateral  Result Date: 02/03/2019 CLINICAL DATA:  TIA symptoms, hypertension, syncope, EXAM: BILATERAL CAROTID DUPLEX ULTRASOUND TECHNIQUE: Pearline Cables scale imaging, color Doppler and duplex ultrasound were performed of bilateral carotid and vertebral arteries in the neck. COMPARISON:  None. FINDINGS: Criteria: Quantification of carotid stenosis is based on velocity parameters that correlate the residual internal carotid diameter with NASCET-based stenosis levels, using the diameter of the distal internal carotid lumen as the denominator for stenosis measurement. The following velocity measurements were obtained: RIGHT ICA: 180/23 cm/sec CCA: 10/17  cm/sec SYSTOLIC ICA/CCA RATIO:  2.0 ECA: 93 cm/sec LEFT ICA: 157/28 cm/sec CCA: 51/0 cm/sec SYSTOLIC ICA/CCA RATIO:  2.7 ECA: 237 cm/sec RIGHT CAROTID ARTERY: Moderate heterogeneous and calcified right carotid bifurcation atherosclerosis extends into the proximal ICA. Proximal ICA mild velocity elevation measures 180/28 with mild turbulent flow. Right ICA stenosis estimated at 50-69% by ultrasound criteria. RIGHT VERTEBRAL ARTERY:  Antegrade LEFT CAROTID ARTERY: Similar moderate heterogeneous calcified left carotid bifurcation atherosclerosis. Mild left ICA velocity elevation measuring 157/58 centimeters/second. Minor turbulent flow. Degree of stenosis also estimated at 50-69% by ultrasound criteria. LEFT VERTEBRAL ARTERY:  Antegrade IMPRESSION: Moderate bilateral ICA stenoses estimated at 50-69% by ultrasound criteria. Patent antegrade vertebral flow bilaterally Electronically Signed   By: [name].  Shick M.D.   On: 02/03/2019 10:35   ECHOCARDIOGRAM COMPLETE  Result Date: 02/03/2019   ECHOCARDIOGRAM REPORT   Patient Name:   [name] Date of Exam: 02/03/2019 Medical Rec #:  [id number]      Height:       71.0 in Accession #:    [id number]     Weight:       188.1 lb Date of Birth:  1927/07/07      BSA:          2.05 m Patient Age:    33 years       BP:           147/54 mmHg Patient Gender: M              HR:           59 bpm. Exam Location:  [facility] Procedure: 2D Echo, Cardiac Doppler and Color Doppler Indications:    TIA 435.9 / G45.9  History:        Patient has prior history of Echocardiogram examinations, most                 recent 08/27/2018. CHF, Arrythmias:Atrial Fibrillation; Risk                 Factors:Hypertension, Dyslipidemia and Diabetes. H/O Moderate                 aortic stenosis.  Sonographer:    Alvino Chapel RCS Referring Phys: [id number] [place]  1. Left ventricular ejection fraction, by visual estimation, is 60 to 65%. The left ventricle has normal function. There is  moderately increased left ventricular hypertrophy.  2. Elevated  left ventricular end-diastolic pressure.  3. Left ventricular diastolic parameters are consistent with Grade I diastolic dysfunction (impaired relaxation).  4. The left ventricle has no regional wall motion abnormalities.  5. Global right ventricle has normal systolic function.The right ventricular size is mildly enlarged. No increase in right ventricular wall thickness.  6. Left atrial size was normal.  7. Right atrial size was normal.  8. Mild mitral annular calcification.  9. The mitral valve is grossly normal. Mild mitral valve regurgitation. 10. The tricuspid valve is grossly normal. 11. The tricuspid valve is grossly normal. Tricuspid valve regurgitation is mild. 12. The aortic valve is tricuspid. Aortic valve regurgitation is not visualized. Moderate to severe aortic valve stenosis. 13. There is severe calcifcation of the aortic valve. 14. The pulmonic valve was not well visualized. Pulmonic valve regurgitation is not visualized. 15. Normal pulmonary artery systolic pressure. 16. The inferior vena cava is normal in size with greater than 50% respiratory variability, suggesting right atrial pressure of 3 mmHg. 17. The interatrial septum was not well visualized. FINDINGS  Left Ventricle: Left ventricular ejection fraction, by visual estimation, is 60 to 65%. The left ventricle has normal function. The left ventricle has no regional wall motion abnormalities. The left ventricular internal cavity size was the left ventricle is normal in size. There is moderately increased left ventricular hypertrophy. Concentric left ventricular hypertrophy. Left ventricular diastolic parameters are consistent with Grade I diastolic dysfunction (impaired relaxation). Elevated left  ventricular end-diastolic pressure. Right Ventricle: The right ventricular size is mildly enlarged. No increase in right ventricular wall thickness. Global RV systolic function is has  normal systolic function. The tricuspid regurgitant velocity is 2.28 m/s, and with an assumed right atrial  pressure of 3 mmHg, the estimated right ventricular systolic pressure is normal at 23.7 mmHg. Left Atrium: Left atrial size was normal in size. Right Atrium: Right atrial size was normal in size Pericardium: There is no evidence of pericardial effusion. Mitral Valve: The mitral valve is grossly normal. Mild mitral annular calcification. Mild mitral valve regurgitation. Tricuspid Valve: The tricuspid valve is grossly normal. Tricuspid valve regurgitation is mild. Aortic Valve: The aortic valve is tricuspid. Aortic valve regurgitation is not visualized. Moderate to severe aortic stenosis is present. There is severe calcifcation of the aortic valve. Aortic valve mean gradient measures 31.5 mmHg. Aortic valve peak gradient measures 49.4 mmHg. Aortic valve area, by VTI measures 0.76 cm. Pulmonic Valve: The pulmonic valve was not well visualized. Pulmonic valve regurgitation is not visualized. Pulmonic regurgitation is not visualized. Aorta: The aortic root is normal in size and structure. Venous: The inferior vena cava is normal in size with greater than 50% respiratory variability, suggesting right atrial pressure of 3 mmHg. IAS/Shunts: The interatrial septum was not well visualized.  LEFT VENTRICLE PLAX 2D LVIDd:         3.78 cm       Diastology LVIDs:         2.49 cm       LV e' lateral:   5.55 cm/s LV PW:         1.30 cm       LV E/e' lateral: 16.0 LV IVS:        1.41 cm       LV e' medial:    5.55 cm/s LVOT diam:     1.90 cm       LV E/e' medial:  16.0 LV SV:         39 ml LV  SV Index:   18.80 LVOT Area:     2.84 cm  LV Volumes (MOD) LV area d, A2C:    21.20 cm LV area d, A4C:    23.50 cm LV area s, A2C:    11.80 cm LV area s, A4C:    11.20 cm LV major d, A2C:   7.01 cm LV major d, A4C:   7.25 cm LV major s, A2C:   5.50 cm LV major s, A4C:   5.60 cm LV vol d, MOD A2C: 53.9 ml LV vol d, MOD A4C: 62.3 ml  LV vol s, MOD A2C: 21.6 ml LV vol s, MOD A4C: 18.8 ml LV SV MOD A2C:     32.3 ml LV SV MOD A4C:     62.3 ml LV SV MOD BP:      38.5 ml RIGHT VENTRICLE RV S prime:     13.70 cm/s TAPSE (M-mode): 2.4 cm LEFT ATRIUM             Index       RIGHT ATRIUM           Index LA diam:        3.50 cm 1.70 cm/m  RA Area:     13.60 cm LA Vol (A2C):   73.0 ml 35.54 ml/m RA Volume:   33.30 ml  16.21 ml/m LA Vol (A4C):   63.4 ml 30.87 ml/m LA Biplane Vol: 69.3 ml 33.74 ml/m  AORTIC VALVE AV Area (Vmax):    0.82 cm AV Area (Vmean):   0.79 cm AV Area (VTI):     0.76 cm AV Vmax:           351.50 cm/s AV Vmean:          269.500 cm/s AV VTI:            1.011 m AV Peak Grad:      49.4 mmHg AV Mean Grad:      31.5 mmHg LVOT Vmax:         102.00 cm/s LVOT Vmean:        75.200 cm/s LVOT VTI:          0.271 m LVOT/AV VTI ratio: 0.27  AORTA Ao Root diam: 3.40 cm MITRAL VALVE                        TRICUSPID VALVE MV Area (PHT): 2.34 cm             TR Peak grad:   20.7 mmHg MV PHT:        93.96 msec           TR Vmax:        294.00 cm/s MV Decel Time: 324 msec MV E velocity: 88.80 cm/s 103 cm/s  SHUNTS MV A velocity: 99.40 cm/s 70.3 cm/s Systemic VTI:  0.27 m MV E/A ratio:  0.89       1.5       Systemic Diam: 1.90 cm  Kate Sable MD Electronically signed by Kate Sable MD Signature Date/Time: 02/03/2019/9:52:13 AM    Final    CT HEAD CODE STROKE WO CONTRAST  Result Date: 02/02/2019 CLINICAL DATA:  Code stroke.  Speech difficulty. EXAM: CT HEAD WITHOUT CONTRAST TECHNIQUE: Contiguous axial images were obtained from the base of the skull through the vertex without intravenous contrast. COMPARISON:  None. FINDINGS: Brain: Mild atrophy. Negative for hydrocephalus. Patchy white matter hypodensity bilaterally is mild and likely chronic. No  acute cortical infarct or mass Small extra-axial fluid collections bilaterally which are slightly denser than CSF. Right-sided fluid collection measures approximately 5 mm and left-sided  fluid collection measures approximately 6 mm in thickness. These are likely chronic subdural hematomas. No midline shift. Vascular: Atherosclerotic calcification in the carotid and vertebral arteries. Negative for hyperdense vessel Skull: Negative for skull fracture or lesion Sinuses/Orbits: Paranasal sinuses clear. Bilateral cataract surgery. Other: None ASPECTS ([place] Stroke Program Early CT Score) - Ganglionic level infarction (caudate, lentiform nuclei, internal capsule, insula, M1-M3 cortex): 7 - Supraganglionic infarction (M4-M6 cortex): 3 Total score (0-10 with 10 being normal): 10 IMPRESSION: 1. Negative for acute infarct 2. ASPECTS is 10 3. Small low-density extra-axial fluid collections bilaterally most likely chronic subdural hematomas. No high-density acute hemorrhage and no midline shift. 4. These results were called by telephone at the time of interpretation on 02/02/2019 at 7:46 pm to provider [facility] , who verbally acknowledged these results. Electronically Signed   By: Franchot Gallo M.D.   On: 02/02/2019 19:46     Subjective: Pt reports that he is feeling better.  His speech is back to normal and no recurrence of symptoms of difficulty with speech.  He is agreeable to return home, he has 24/7 supervision.  He is eating well this morning.    Discharge Exam: Vitals:   02/04/19 0000 02/04/19 0402  BP: (!) 145/54 (!) 156/58  Pulse: (!) 54 (!) 57  Resp: 16 16  Temp: 98 F (36.7 C) 98 F (36.7 C)  SpO2: 97% 97%   Vitals:   02/03/19 2009 02/03/19 2026 02/04/19 0000 02/04/19 0402  BP: (!) 148/72  (!) 145/54 (!) 156/58  Pulse: 62  (!) 54 (!) 57  Resp: 20  16 16   Temp: 98 F (36.7 C)  98 F (36.7 C) 98 F (36.7 C)  TempSrc: Oral  Oral Oral  SpO2: 99% 99% 97% 97%  Weight:      Height:       General exam: Elderly male lying in the bed he is awake and alert he is in no apparent distress. Respiratory system: Clear. No increased work of breathing. Cardiovascular system:  Normal S1 & S2 heard. No JVD, murmurs, gallops, clicks or pedal edema. Gastrointestinal system: Abdomen is nondistended, soft and nontender. Normal bowel sounds heard. Central nervous system: Alert and oriented. No focal neurological deficits. Extremities: left BKA wound healing well.   The results of significant diagnostics from this hospitalization (including imaging, microbiology, ancillary and laboratory) are listed below for reference.     Microbiology: Recent Results (from the past 240 hour(s))  SARS CORONAVIRUS 2 (TAT 6-24 HRS) Nasopharyngeal     Status: None   Collection Time: 02/02/19  7:25 PM   Specimen: Nasopharyngeal  Result Value Ref Range Status   SARS Coronavirus 2 NEGATIVE NEGATIVE Final    Comment: (NOTE) SARS-CoV-2 target nucleic acids are NOT DETECTED. The SARS-CoV-2 RNA is generally detectable in upper and lower respiratory specimens during the acute phase of infection. Negative results do not preclude SARS-CoV-2 infection, do not rule out co-infections with other pathogens, and should not be used as the sole basis for treatment or other patient management decisions. Negative results must be combined with clinical observations, patient history, and epidemiological information. The expected result is Negative. Fact Sheet for Patients: SugarRoll.be Fact Sheet for Healthcare Providers: https://www.woods-mathews.com/ This test is not yet approved or cleared by the [place] FDA and  has been authorized for detection and/or diagnosis of SARS-CoV-2  by FDA under an Emergency Use Authorization (EUA). This EUA will remain  in effect (meaning this test can be used) for the duration of the COVID-19 declaration under Section 56 4(b)(1) of the Act, 21 U.S.C. section 360bbb-3(b)(1), unless the authorization is terminated or revoked sooner. Performed at [facility], [place] [address] [place], [place] [address]       Labs: BNP (last 3 results) Recent Labs    11/09/18 1209 11/18/18 2132 12/09/18 0343  BNP 258.0* 213.1* 604.5*   Basic Metabolic Panel: Recent Labs  Lab 02/02/19 1926 02/02/19 1933 02/03/19 0442 02/04/19 0620  NA 127* 129* 129* 131*  K 3.8 3.8 3.2* 3.8  CL 90* 91* 91* 97*  CO2 26  --  25 25  GLUCOSE 82 77 111* 105*  BUN 23 22 23 23   CREATININE 1.16 1.30* 1.33* 1.17  CALCIUM 8.7*  --  8.5* 8.8*  MG  --   --   --  2.1   Liver Function Tests: Recent Labs  Lab 02/02/19 1926 02/04/19 0620  AST 26 21  ALT 20 15  ALKPHOS 61 43  BILITOT 0.6 0.4  PROT 7.4 6.5  ALBUMIN 3.4* 3.1*   No results for input(s): LIPASE, AMYLASE in the last 168 hours. No results for input(s): AMMONIA in the last 168 hours. CBC: Recent Labs  Lab 02/02/19 1926 02/02/19 1933 02/04/19 0620  WBC 7.7  --  7.4  NEUTROABS 3.7  --  3.3  HGB 9.6* 10.5* 9.1*  HCT 29.5* 31.0* 27.9*  MCV 96.7  --  96.9  PLT 382  --  376   Cardiac Enzymes: No results for input(s): CKTOTAL, CKMB, CKMBINDEX, TROPONINI in the last 168 hours. BNP: Invalid input(s): POCBNP CBG: Recent Labs  Lab 02/03/19 0940 02/03/19 1245 02/03/19 1801 02/03/19 2132 02/04/19 0719  GLUCAP 91 103* 113* 130* 93   D-Dimer No results for input(s): DDIMER in the last 72 hours. Hgb A1c No results for input(s): HGBA1C in the last 72 hours. Lipid Profile Recent Labs    02/03/19 0442  CHOL 80  HDL 26*  LDLCALC 37  TRIG 85  CHOLHDL 3.1   Thyroid function studies No results for input(s): TSH, T4TOTAL, T3FREE, THYROIDAB in the last 72 hours.  Invalid input(s): FREET3 Anemia work up No results for input(s): VITAMINB12, FOLATE, FERRITIN, TIBC, IRON, RETICCTPCT in the last 72 hours. Urinalysis    Component Value Date/Time   COLORURINE STRAW (A) 02/02/2019 1924   APPEARANCEUR CLEAR 02/02/2019 1924   LABSPEC 1.004 (L) 02/02/2019 1924   PHURINE 7.0 02/02/2019 1924   GLUCOSEU NEGATIVE 02/02/2019 1924   HGBUR NEGATIVE  02/02/2019 1924   BILIRUBINUR NEGATIVE 02/02/2019 [place] NEGATIVE 02/02/2019 1924   PROTEINUR NEGATIVE 02/02/2019 1924   NITRITE NEGATIVE 02/02/2019 1924   LEUKOCYTESUR MODERATE (A) 02/02/2019 1924   Sepsis Labs Invalid input(s): PROCALCITONIN,  WBC,  LACTICIDVEN Microbiology Recent Results (from the past 240 hour(s))  SARS CORONAVIRUS 2 (TAT 6-24 HRS) Nasopharyngeal     Status: None   Collection Time: 02/02/19  7:25 PM   Specimen: Nasopharyngeal  Result Value Ref Range Status   SARS Coronavirus 2 NEGATIVE NEGATIVE Final    Comment: (NOTE) SARS-CoV-2 target nucleic acids are NOT DETECTED. The SARS-CoV-2 RNA is generally detectable in upper and lower respiratory specimens during the acute phase of infection. Negative results do not preclude SARS-CoV-2 infection, do not rule out co-infections with other pathogens, and should not be used as the sole basis for  treatment or other patient management decisions. Negative results must be combined with clinical observations, patient history, and epidemiological information. The expected result is Negative. Fact Sheet for Patients: SugarRoll.be Fact Sheet for Healthcare Providers: https://www.woods-mathews.com/ This test is not yet approved or cleared by the [place] FDA and  has been authorized for detection and/or diagnosis of SARS-CoV-2 by FDA under an Emergency Use Authorization (EUA). This EUA will remain  in effect (meaning this test can be used) for the duration of the COVID-19 declaration under Section 56 4(b)(1) of the Act, 21 U.S.C. section 360bbb-3(b)(1), unless the authorization is terminated or revoked sooner. Performed at [facility], [place] [address], [place], [place] [address]    Time coordinating discharge: 33 minutes   SIGNED:  Irwin Brakeman, MD  [facility] 02/04/2019, 9:27 AM How to contact the [facility] Attending or Consulting provider [place] or  covering provider during after hours [place], for this patient?  1. Check the care team in [facility] and look for a) attending/consulting TRH provider listed and b) the [facility] team listed 2. Log into [email or web address] and use [place]'s universal password to access. If you do not have the password, please contact the hospital operator. 3. Locate the [facility] provider you are looking for under [facility] and page to a number that you can be directly reached. 4. If you still have difficulty reaching the provider, please page the [facility] (Director on Call) for the Hospitalists listed on amion for assistance.

## 2019-02-04 NOTE — Plan of Care (Signed)
  Problem: Education: Goal: Knowledge of General Education information will improve Description: Including pain rating scale, medication(s)/side effects and non-pharmacologic comfort measures 02/04/2019 0925 by Zachery Conch, RN Outcome: Adequate for Discharge 02/04/2019 0900 by Zachery Conch, RN Outcome: Progressing   Problem: Health Behavior/Discharge Planning: Goal: Ability to manage health-related needs will improve Outcome: Adequate for Discharge   Problem: Clinical Measurements: Goal: Ability to maintain clinical measurements within normal limits will improve Outcome: Adequate for Discharge Goal: Will remain free from infection 02/04/2019 0925 by Zachery Conch, RN Outcome: Adequate for Discharge 02/04/2019 0900 by Zachery Conch, RN Outcome: Progressing Goal: Diagnostic test results will improve Outcome: Adequate for Discharge Goal: Respiratory complications will improve 02/04/2019 0925 by Zachery Conch, RN Outcome: Adequate for Discharge 02/04/2019 0900 by Zachery Conch, RN Outcome: Progressing Goal: Cardiovascular complication will be avoided Outcome: Adequate for Discharge   Problem: Activity: Goal: Risk for activity intolerance will decrease Outcome: Adequate for Discharge   Problem: Nutrition: Goal: Adequate nutrition will be maintained 02/04/2019 0925 by Zachery Conch, RN Outcome: Adequate for Discharge 02/04/2019 0900 by Zachery Conch, RN Outcome: Progressing   Problem: Coping: Goal: Level of anxiety will decrease Outcome: Adequate for Discharge   Problem: Elimination: Goal: Will not experience complications related to bowel motility Outcome: Adequate for Discharge Goal: Will not experience complications related to urinary retention Outcome: Adequate for Discharge   Problem: Pain Managment: Goal: General experience of comfort will improve 02/04/2019 0925 by Zachery Conch, RN Outcome: Adequate for Discharge 02/04/2019 0900 by Zachery Conch,  RN Outcome: Progressing   Problem: Safety: Goal: Ability to remain free from injury will improve Outcome: Adequate for Discharge   Problem: Skin Integrity: Goal: Risk for impaired skin integrity will decrease 02/04/2019 0925 by Zachery Conch, RN Outcome: Adequate for Discharge 02/04/2019 0900 by Zachery Conch, RN Outcome: Progressing   Problem: Education: Goal: Knowledge of disease or condition will improve 02/04/2019 0925 by Zachery Conch, RN Outcome: Adequate for Discharge 02/04/2019 0900 by Zachery Conch, RN Outcome: Progressing Goal: Knowledge of secondary prevention will improve Outcome: Adequate for Discharge Goal: Knowledge of patient specific risk factors addressed and post discharge goals established will improve Outcome: Adequate for Discharge Goal: Individualized Educational Video(s) Outcome: Adequate for Discharge   Problem: Coping: Goal: Will verbalize positive feelings about self Outcome: Adequate for Discharge Goal: Will identify appropriate support needs Outcome: Adequate for Discharge   Problem: Health Behavior/Discharge Planning: Goal: Ability to manage health-related needs will improve Outcome: Adequate for Discharge   Problem: Self-Care: Goal: Ability to participate in self-care as condition permits will improve 02/04/2019 0925 by Zachery Conch, RN Outcome: Adequate for Discharge 02/04/2019 0900 by Zachery Conch, RN Outcome: Progressing Goal: Verbalization of feelings and concerns over difficulty with self-care will improve Outcome: Adequate for Discharge Goal: Ability to communicate needs accurately will improve Outcome: Adequate for Discharge   Problem: Nutrition: Goal: Risk of aspiration will decrease 02/04/2019 0925 by Zachery Conch, RN Outcome: Adequate for Discharge 02/04/2019 0900 by Zachery Conch, RN Outcome: Progressing Goal: Dietary intake will improve Outcome: Adequate for Discharge

## 2019-02-04 NOTE — Plan of Care (Signed)
  Problem: Education: Goal: Knowledge of General Education information will improve Description: Including pain rating scale, medication(s)/side effects and non-pharmacologic comfort measures Outcome: Progressing   Problem: Clinical Measurements: Goal: Will remain free from infection Outcome: Progressing Goal: Respiratory complications will improve Outcome: Progressing   Problem: Nutrition: Goal: Adequate nutrition will be maintained Outcome: Progressing   Problem: Pain Managment: Goal: General experience of comfort will improve Outcome: Progressing   Problem: Skin Integrity: Goal: Risk for impaired skin integrity will decrease Outcome: Progressing   Problem: Education: Goal: Knowledge of disease or condition will improve Outcome: Progressing   Problem: Self-Care: Goal: Ability to participate in self-care as condition permits will improve Outcome: Progressing   Problem: Nutrition: Goal: Risk of aspiration will decrease Outcome: Progressing

## 2019-02-04 NOTE — Discharge Instructions (Signed)
Transient Ischemic Attack  A transient ischemic attack (TIA) is a "warning stroke" that causes stroke-like symptoms that go away quickly. A TIA does not cause lasting damage to the brain. But having a TIA is a sign that you may be at risk for a stroke. Lifestyle changes and medical treatments can help prevent a stroke. It is important to know the symptoms of a TIA and what to do. Get help right away, even if your symptoms go away. The symptoms of a TIA are the same as those of a stroke. They can happen fast, and they usually go away within minutes or hours. They can include:  Weakness or loss of feeling in your face, arm, or leg. This often happens on one side of your body.  Trouble walking.  Trouble moving your arms or legs.  Trouble talking or understanding what people are saying.  Trouble seeing.  Seeing two of one object (double vision).  Feeling dizzy.  Feeling confused.  Loss of balance or coordination.  Feeling sick to your stomach (nauseous) and throwing up (vomiting).  A very bad headache for no reason. What increases the risk? Certain things may make you more likely to have a TIA. Some of these are things that you can change, such as:  Being very overweight (obese).  Using products that contain nicotine or tobacco, such as cigarettes and e-cigarettes.  Taking birth control pills.  Not being active.  Drinking too much alcohol.  Using drugs. Other risk factors include:  Having an irregular heartbeat (atrial fibrillation).  Being African American or Hispanic.  Having had blood clots, stroke, TIA, or heart attack in the past.  Being a woman with a history of high blood pressure in pregnancy (preeclampsia).  Being over the age of 60.  Being male.  Having family history of stroke.  Having the following diseases or conditions: ? High blood pressure. ? High cholesterol. ? Diabetes. ? Heart disease. ? Sickle cell disease. ? Sleep apnea. ? Migraine  headache. ? Long-term (chronic) diseases that cause soreness and swelling (inflammation). ? Disorders that affect how your blood clots. Follow these instructions at home: Medicines   Take over-the-counter and prescription medicines only as told by your doctor.  If you were told to take aspirin or another medicine to thin your blood, take it exactly as told by your doctor. ? Taking too much of the medicine can cause bleeding. ? Taking too little of the medicine may not work to treat the problem. Eating and drinking   Eat 5 or more servings of fruits and vegetables each day.  Follow instructions from your doctor about your diet. You may need to follow a certain diet to help lower your risk of having a stroke. You may need to: ? Eat a diet that is low in fat and salt. ? Eat foods that contain a lot of fiber. ? Limit the amount of carbohydrates and sugar in your diet.  Limit alcohol intake to 1 drink a day for nonpregnant women and 2 drinks a day for men. One drink equals 12 oz of beer, 5 oz of wine, or 1 oz of hard liquor. General instructions  Keep a healthy weight.  Stay active. Try to get at least 30 minutes of activity on all or most days.  Find out if you have a condition called sleep apnea. Get treatment if needed.  Do not use any products that contain nicotine or tobacco, such as cigarettes and e-cigarettes. If you need help quitting,   ask your doctor.  Do not abuse drugs.  Keep all follow-up visits as told by your doctor. This is important. Get help right away if:  You have any signs of stroke. "BE FAST" is an easy way to remember the main warning signs: ? B - Balance. Signs are dizziness, sudden trouble walking, or loss of balance. ? E - Eyes. Signs are trouble seeing or a sudden change in how you see. ? F - Face. Signs are sudden weakness or loss of feeling of the face, or the face or eyelid drooping on one side. ? A - Arms. Signs are weakness or loss of feeling in an  arm. This happens suddenly and usually on one side of the body. ? S - Speech. Signs are sudden trouble speaking, slurred speech, or trouble understanding what people say. ? T - Time. Time to call emergency services. Write down what time symptoms started.  You have other signs of stroke, such as: ? A sudden, very bad headache with no known cause. ? Feeling sick to your stomach (nausea). ? Throwing up (vomiting). ? Jerky movements that you cannot control (seizure). These symptoms may be an emergency. Do not wait to see if the symptoms will go away. Get medical help right away. Call your local emergency services (911 in the [place] Do not drive yourself to the hospital. Summary  A transient ischemic attack (TIA) is a "warning stroke" that causes stroke-like symptoms that go away quickly.  A TIA is a medical emergency. Get help right away, even if your symptoms go away.  A TIA does not cause lasting damage to the brain.  Having a TIA is a sign that you may be at risk for a stroke. Lifestyle changes and medical treatments can help prevent a stroke. This information is not intended to replace advice given to you by your health care provider. Make sure you discuss any questions you have with your health care provider. Document Revised: 09/26/2017 Document Reviewed: 04/03/2016 Elsevier Patient Education  2020 [place].   IMPORTANT INFORMATION: PAY CLOSE ATTENTION   PHYSICIAN DISCHARGE INSTRUCTIONS  Follow with Primary care provider  Redmond School, MD  and other consultants as instructed by your Hospitalist Physician  [place] IF SYMPTOMS COME BACK, WORSEN OR NEW PROBLEM DEVELOPS   Please note: You were cared for by a hospitalist during your hospital stay. Every effort will be made to forward records to your primary care provider.  You can request that your primary care provider send for your hospital records if they have not received them.  Once you  are discharged, your primary care physician will handle any further medical issues. Please note that NO REFILLS for any discharge medications will be authorized once you are discharged, as it is imperative that you return to your primary care physician (or establish a relationship with a primary care physician if you do not have one) for your post hospital discharge needs so that they can reassess your need for medications and monitor your lab values.  Please get a complete blood count and chemistry panel checked by your Primary MD at your next visit, and again as instructed by your Primary MD.  Get Medicines reviewed and adjusted: Please take all your medications with you for your next visit with your Primary MD  Laboratory/radiological data: Please request your Primary MD to go over all hospital tests and procedure/radiological results at the follow up, please ask your primary care provider  to get all Hospital records sent to his/her office.  In some cases, they will be blood work, cultures and biopsy results pending at the time of your discharge. Please request that your primary care provider follow up on these results.  If you are diabetic, please bring your blood sugar readings with you to your follow up appointment with primary care.    Please call and make your follow up appointments as soon as possible.    Also Note the following: If you experience worsening of your admission symptoms, develop shortness of breath, life threatening emergency, suicidal or homicidal thoughts you must seek medical attention immediately by calling 911 or calling your MD immediately  if symptoms less severe.  You must read complete instructions/literature along with all the possible adverse reactions/side effects for all the Medicines you take and that have been prescribed to you. Take any new Medicines after you have completely understood and accpet all the possible adverse reactions/side effects.   Do not  drive when taking Pain medications or sleeping medications (Benzodiazepines)  Do not take more than prescribed Pain, Sleep and Anxiety Medications. It is not advisable to combine anxiety,sleep and pain medications without talking with your primary care practitioner  Special Instructions: If you have smoked or chewed Tobacco  in the last 2 yrs please stop smoking, stop any regular Alcohol  and or any Recreational drug use.  Wear Seat belts while driving.  Do not drive if taking any narcotic, mind altering or controlled substances or recreational drugs or alcohol.

## 2019-02-04 NOTE — TOC Transition Note (Signed)
Transition of Care [facility]) - CM/SW Discharge Note   Patient Details  Name: [name] MRN: [id number] Date of Birth: 12/10/1927  Transition of [facility]) CM/SW Contact:  Baine Decesare, Chauncey [place], RN Phone Number: 02/04/2019, 12:53 PM    Social Determinants of Health (SDOH) Interventions     Readmission Risk Interventions Readmission Risk Prevention Plan 02/04/2019 12/22/2018 11/26/2018  Transportation Screening Complete Complete Complete  PCP or Specialist Appt within 3-5 Days - - -  Not Complete comments - - -  HRI or Energy or Home Care Consult comments - - -  Social Work Consult for [place] - - -  Medication Review Press photographer) Complete Complete Complete  PCP or Specialist appointment within 3-5 days of discharge Complete Complete Complete  HRI or [place] Complete Complete Complete  SW Recovery Care/Counseling Consult Complete Complete Complete  Palliative Care Screening Not Applicable Not Applicable Not [place] Not Applicable Patient Refused Not Applicable  Some recent data might be hidden

## 2019-02-05 DIAGNOSIS — M1990 Unspecified osteoarthritis, unspecified site: Secondary | ICD-10-CM | POA: Diagnosis not present

## 2019-02-05 DIAGNOSIS — E785 Hyperlipidemia, unspecified: Secondary | ICD-10-CM | POA: Diagnosis not present

## 2019-02-05 DIAGNOSIS — I5032 Chronic diastolic (congestive) heart failure: Secondary | ICD-10-CM | POA: Diagnosis not present

## 2019-02-05 DIAGNOSIS — H409 Unspecified glaucoma: Secondary | ICD-10-CM | POA: Diagnosis not present

## 2019-02-05 DIAGNOSIS — N183 Chronic kidney disease, stage 3 unspecified: Secondary | ICD-10-CM | POA: Diagnosis not present

## 2019-02-05 DIAGNOSIS — I130 Hypertensive heart and chronic kidney disease with heart failure and stage 1 through stage 4 chronic kidney disease, or unspecified chronic kidney disease: Secondary | ICD-10-CM | POA: Diagnosis not present

## 2019-02-05 DIAGNOSIS — Z89512 Acquired absence of left leg below knee: Secondary | ICD-10-CM | POA: Diagnosis not present

## 2019-02-05 DIAGNOSIS — T8781 Dehiscence of amputation stump: Secondary | ICD-10-CM | POA: Diagnosis not present

## 2019-02-05 DIAGNOSIS — I480 Paroxysmal atrial fibrillation: Secondary | ICD-10-CM | POA: Diagnosis not present

## 2019-02-05 DIAGNOSIS — R338 Other retention of urine: Secondary | ICD-10-CM | POA: Diagnosis not present

## 2019-02-05 DIAGNOSIS — E1151 Type 2 diabetes mellitus with diabetic peripheral angiopathy without gangrene: Secondary | ICD-10-CM | POA: Diagnosis not present

## 2019-02-05 DIAGNOSIS — E1122 Type 2 diabetes mellitus with diabetic chronic kidney disease: Secondary | ICD-10-CM | POA: Diagnosis not present

## 2019-02-08 ENCOUNTER — Telehealth: Payer: Self-pay | Admitting: Orthopedic Surgery

## 2019-02-08 NOTE — Telephone Encounter (Signed)
Judson Roch was called and given verbal okay for orders.

## 2019-02-08 NOTE — Telephone Encounter (Signed)
Judson Roch from [facility] called  She is requesting resumption of care orders 1wk2 2wk2 1wk2   Call back number: [phone]

## 2019-02-09 DIAGNOSIS — I10 Essential (primary) hypertension: Secondary | ICD-10-CM | POA: Diagnosis not present

## 2019-02-09 DIAGNOSIS — Z0001 Encounter for general adult medical examination with abnormal findings: Secondary | ICD-10-CM | POA: Diagnosis not present

## 2019-02-09 DIAGNOSIS — E039 Hypothyroidism, unspecified: Secondary | ICD-10-CM | POA: Diagnosis not present

## 2019-02-09 DIAGNOSIS — E782 Mixed hyperlipidemia: Secondary | ICD-10-CM | POA: Diagnosis not present

## 2019-02-09 DIAGNOSIS — Z1389 Encounter for screening for other disorder: Secondary | ICD-10-CM | POA: Diagnosis not present

## 2019-02-09 DIAGNOSIS — Z0000 Encounter for general adult medical examination without abnormal findings: Secondary | ICD-10-CM | POA: Diagnosis not present

## 2019-02-09 DIAGNOSIS — G459 Transient cerebral ischemic attack, unspecified: Secondary | ICD-10-CM | POA: Diagnosis not present

## 2019-02-09 DIAGNOSIS — Z681 Body mass index (BMI) 19 or less, adult: Secondary | ICD-10-CM | POA: Diagnosis not present

## 2019-02-10 ENCOUNTER — Ambulatory Visit (INDEPENDENT_AMBULATORY_CARE_PROVIDER_SITE_OTHER): Payer: Medicare Other | Admitting: Family

## 2019-02-10 ENCOUNTER — Ambulatory Visit: Payer: Medicare Other | Admitting: Physician Assistant

## 2019-02-10 ENCOUNTER — Encounter: Payer: Self-pay | Admitting: Family

## 2019-02-10 ENCOUNTER — Other Ambulatory Visit: Payer: Self-pay

## 2019-02-10 VITALS — Ht 71.0 in | Wt 188.0 lb

## 2019-02-10 DIAGNOSIS — Z89511 Acquired absence of right leg below knee: Secondary | ICD-10-CM

## 2019-02-10 DIAGNOSIS — G459 Transient cerebral ischemic attack, unspecified: Secondary | ICD-10-CM

## 2019-02-10 DIAGNOSIS — Z89512 Acquired absence of left leg below knee: Secondary | ICD-10-CM

## 2019-02-10 MED ORDER — SULFAMETHOXAZOLE-TRIMETHOPRIM 800-160 MG PO TABS: 1.0000 | ORAL_TABLET | Freq: Two times a day (BID) | ORAL | 0 refills | Status: DC

## 2019-02-10 NOTE — Progress Notes (Signed)
Post-Op Visit Note   Patient: [name]           Date of Birth: [birth year]           MRN: [id number] Visit Date: 02/10/2019 PCP: Redmond School, MD  Chief Complaint:  Chief Complaint  Patient presents with  . Left Leg - Routine Post Op    01/20/19 left BKA revision     HPI:  HPI The patient is an 86 year old gentleman seen today status post left below-knee amputation revision on January 6.  He has some redness and tenderness to the medial aspect of his stump  Ortho Exam On examination of the left residual limb incision is well-healed laterally medially there is a 2 cm length that has not yet fully healed there is granulation there is no active bleeding.  He does have some erythema and warmth medially with mild tenderness no ascending cellulitis  Visit Diagnoses:  1. S/P bilateral BKA (below knee amputation) ([place])     Plan: We will place him on a Bactrim course.  Harvested sutures and staples laterally he will follow-up in 2 more weeks.  Continue with daily Dial soap cleansing and dry dressing changes.  Provide an order to [facility] clinic for his prosthesis set up on the left  Follow-Up Instructions: Return in about 2 weeks (around 02/24/2019).   Imaging: No results found.  Orders:  No orders of the defined types were placed in this encounter.  No orders of the defined types were placed in this encounter.    PMFS History: Patient Active Problem List   Diagnosis Date Noted  . Pressure injury of skin 02/03/2019  . TIA (transient ischemic attack) 02/03/2019  . Transient speech disturbance 02/02/2019  . Chronic indwelling Foley catheter 01/22/2019  . Postoperative wound infection 01/20/2019  . Normocytic anemia 01/18/2019  . Cellulitis 01/18/2019  . Infection of deep incisional surgical site after procedure   . Abnormal urinalysis   . Labile blood glucose   . Urinary retention   . S/P bilateral BKA (below knee amputation) ([place])   . Acute blood loss anemia   .  Urinary retention due to benign prostatic hyperplasia 12/28/2018  . Unilateral complete BKA, left, initial encounter ([place]) 12/25/2018  . Dehiscence of amputation stump ([place])   . History of transmetatarsal amputation of left foot ([place]) 12/09/2018  . Critical limb ischemia with history of revascularization of same extremity   . Gangrene of left foot ([place])   . Diabetic ulcer of toe of left foot associated with type 2 diabetes mellitus ([place])   . Cellulitis of left lower extremity   . Toe infection 11/18/2018  . Peripheral edema   . Acute on chronic diastolic CHF (congestive heart failure) ([place]) 11/09/2018  . S/P BKA (below knee amputation) unilateral, right ([place]) 10/05/2018  . Hyponatremia   . Essential hypertension   . Postoperative pain   . Pain of left heel   . Unable to maintain weight-bearing   . Type 2 diabetes mellitus with diabetic peripheral angiopathy with gangrene ([place])   . Anemia due to acute blood loss   . Chronic kidney disease, stage 3a   . Acquired absence of right leg below knee ([place]) 09/07/2018  . Gangrene of right foot ([place])   . DNR (do not resuscitate) discussion   . Goals of care, counseling/discussion   . Palliative care by specialist   . Type 2 diabetes mellitus with vascular disease ([place])   . Severe protein-calorie malnutrition ([place])   .  Ischemic foot 08/20/2018  . Critical lower limb ischemia 08/03/2018  . Type 2 diabetes mellitus with foot ulcer, without long-term current use of insulin ([place])   . Gastroesophageal reflux disease   . Cellulitis of great toe of right foot 05/29/2018  . Atrial fibrillation, chronic   . Chest pain 07/25/2017  . PAF (paroxysmal atrial fibrillation) ([place]) 07/25/2017  . BPH (benign prostatic hyperplasia) 07/25/2017  . Shortness of breath 05/11/2017  . Hypothyroidism 05/11/2017  . Chronic diastolic CHF (congestive heart failure) ([place]) 05/11/2017  . Moderate aortic stenosis 06/12/2016  . RBBB 06/12/2016  . Peripheral vascular  disease ([place]) 08/04/2012  . Essential hypertension, benign 08/04/2012  . Hyperlipidemia 08/04/2012  . Dizziness 08/04/2012   Past Medical History:  Diagnosis Date  . Arthritis   . Borderline diabetic   . Diabetes mellitus without complication ([place])   . Glaucoma   . Hyperlipidemia   . Hypertension   . PVD (peripheral vascular disease) ([place])   . Sleep apnea    Cpap ordered but doesnt use  . Urinary retention due to benign prostatic hyperplasia 12/28/2018    Family History  Problem Relation Age of Onset  . Cancer Mother   . Heart attack Father   . Stroke Father   . Heart attack Brother   . Heart attack Brother     Past Surgical History:  Procedure Laterality Date  . ABDOMINAL AORTOGRAM W/LOWER EXTREMITY N/A 07/27/2018   Procedure: ABDOMINAL AORTOGRAM W/LOWER EXTREMITY;  Surgeon: Lorretta Harp, MD;  Location: [place] [facility];  Service: Cardiovascular;  Laterality: N/A;  . AMPUTATION Right 08/28/2018   Procedure: RIGHT BELOW KNEE AMPUTATION;  Surgeon: [name], MD;  Location: [place];  Service: Orthopedics;  Laterality: Right;  . AMPUTATION Left 12/02/2018   Procedure: LEFT TRANSMETATARSAL AMPUTATION AND NEGATIVE PRESSURE WOUND VAC PLACEMENT;  Surgeon: [name], MD;  Location: [place];  Service: Orthopedics;  Laterality: Left;  . AMPUTATION Left 12/18/2018   Procedure: LEFT BELOW KNEE AMPUTATION;  Surgeon: [name], MD;  Location: [place];  Service: Orthopedics;  Laterality: Left;  . APPENDECTOMY  1943  . BELOW KNEE LEG AMPUTATION Left 12/18/2018  . CATARACT EXTRACTION  2012   x2  . COLONOSCOPY N/A 11/01/2014   Procedure: COLONOSCOPY;  Surgeon: Aviva Signs, MD;  [facility];  Service: Gastroenterology;  Laterality: N/A;  . ESOPHAGOGASTRODUODENOSCOPY N/A 11/01/2014   Procedure: ESOPHAGOGASTRODUODENOSCOPY (EGD);  Surgeon: Aviva Signs, MD;  [facility];  Service: Gastroenterology;  Laterality: N/A;  . HERNIA REPAIR Left   . INGUINAL  HERNIA REPAIR Right 03/01/2013   Procedure: RIGHT INGUINAL HERNIORRHAPHY;  Surgeon: Jamesetta So, MD;  [facility];  Service: General;  Laterality: Right;  . INSERTION OF MESH Right 03/01/2013   Procedure: INSERTION OF MESH;  Surgeon: Jamesetta So, MD;  [facility];  Service: General;  Laterality: Right;  . LOWER EXTREMITY ANGIOGRAPHY Right 08/25/2018   Procedure: LOWER EXTREMITY ANGIOGRAPHY;  Surgeon: Wellington Hampshire, MD;  Location: [place] [facility];  Service: Cardiovascular;  Laterality: Right;  . LOWER EXTREMITY ANGIOGRAPHY N/A 11/25/2018   Procedure: LOWER EXTREMITY ANGIOGRAPHY;  Surgeon: Wellington Hampshire, MD;  Location: [place] [facility];  Service: Cardiovascular;  Laterality: N/A;  . NM MYOCAR PERF WALL MOTION  06/05/2010   no significant ischemia  . PERIPHERAL VASCULAR ATHERECTOMY Right 08/03/2018   Procedure: PERIPHERAL VASCULAR ATHERECTOMY;  Surgeon: Lorretta Harp, MD;  Location: [place] [facility];  Service:  Cardiovascular;  Laterality: Right;  . PERIPHERAL VASCULAR ATHERECTOMY Left 11/23/2018   Procedure: PERIPHERAL VASCULAR ATHERECTOMY;  Surgeon: Lorretta Harp, MD;  Location: [place] [facility];  Service: Cardiovascular;  Laterality: Left;  sfa with dc balloon  . PERIPHERAL VASCULAR BALLOON ANGIOPLASTY Left 11/25/2018   Procedure: PERIPHERAL VASCULAR BALLOON ANGIOPLASTY;  Surgeon: Wellington Hampshire, MD;  Location: [facility];  Service: Cardiovascular;  Laterality: Left;  popliteal  . PERIPHERAL VASCULAR INTERVENTION Left 11/25/2018   Procedure: PERIPHERAL VASCULAR INTERVENTION;  Surgeon: Wellington Hampshire, MD;  Location: [place] [facility];  Service: Cardiovascular;  Laterality: Left;  tibial peroneal trunk and peroneal stents   . stent  06/14/2010   left leg  . STUMP REVISION Left 01/20/2019   Procedure: REVISION LEFT BELOW KNEE AMPUTATION;  Surgeon: [name], MD;  Location: [place];  Service: Orthopedics;  Laterality: Left;  . US ECHOCARDIOGRAPHY   01/21/2006   moderate mitral annular ca+, mild MR, AOV moderately sclerotic.   Social History   Occupational History  . Not on file  Tobacco Use  . Smoking status: Former Smoker    Quit date: 01/13/1965    Years since quitting: 54.1  . Smokeless tobacco: Former Systems developer    Types: Chew  Substance and Sexual Activity  . Alcohol use: No  . Drug use: No  . Sexual activity: Not Currently

## 2019-02-11 ENCOUNTER — Encounter: Payer: Self-pay | Admitting: Physician Assistant

## 2019-02-11 DIAGNOSIS — R338 Other retention of urine: Secondary | ICD-10-CM | POA: Diagnosis not present

## 2019-02-11 DIAGNOSIS — H409 Unspecified glaucoma: Secondary | ICD-10-CM | POA: Diagnosis not present

## 2019-02-11 DIAGNOSIS — E1151 Type 2 diabetes mellitus with diabetic peripheral angiopathy without gangrene: Secondary | ICD-10-CM | POA: Diagnosis not present

## 2019-02-11 DIAGNOSIS — E785 Hyperlipidemia, unspecified: Secondary | ICD-10-CM | POA: Diagnosis not present

## 2019-02-11 DIAGNOSIS — I5032 Chronic diastolic (congestive) heart failure: Secondary | ICD-10-CM | POA: Diagnosis not present

## 2019-02-11 DIAGNOSIS — M1990 Unspecified osteoarthritis, unspecified site: Secondary | ICD-10-CM | POA: Diagnosis not present

## 2019-02-11 DIAGNOSIS — I130 Hypertensive heart and chronic kidney disease with heart failure and stage 1 through stage 4 chronic kidney disease, or unspecified chronic kidney disease: Secondary | ICD-10-CM | POA: Diagnosis not present

## 2019-02-11 DIAGNOSIS — Z89512 Acquired absence of left leg below knee: Secondary | ICD-10-CM | POA: Diagnosis not present

## 2019-02-11 DIAGNOSIS — N183 Chronic kidney disease, stage 3 unspecified: Secondary | ICD-10-CM | POA: Diagnosis not present

## 2019-02-11 DIAGNOSIS — E1122 Type 2 diabetes mellitus with diabetic chronic kidney disease: Secondary | ICD-10-CM | POA: Diagnosis not present

## 2019-02-11 DIAGNOSIS — I480 Paroxysmal atrial fibrillation: Secondary | ICD-10-CM | POA: Diagnosis not present

## 2019-02-11 DIAGNOSIS — T8781 Dehiscence of amputation stump: Secondary | ICD-10-CM | POA: Diagnosis not present

## 2019-02-11 NOTE — Progress Notes (Signed)
Office Visit Note   Patient: [name]           Date of Birth: 02/23/1927           MRN: [id number] Visit Date: 02/10/2019              Requested by: Redmond School, [place],  [place] [id number] PCP: Redmond School, MD  No chief complaint on file.     HPI: Patient was her earlier this afternoon. He is status post BKA Revision. His lateral sutures were removed.  His son called late this afternoon saying his father had bled through his dressings and there was a clot in the limb protector  Assessment & Plan: Visit Diagnoses: No diagnosis found.  Plan: A compressive dressing was applied which the patient should change tomorrow and hopefully go back to his shrinker. If he has any problems he is to call us . Explained to the son that the cause of the bleeding was from and area of suture removal . Of note the patient is on Plavix  Follow-Up Instructions: No follow-ups on file.   Ortho Exam  Patient is alert, oriented, no adenopathy, well-dressed, normal affect, normal respiratory effort. Dressing was removed. There is no wound dehiscence . There is a pinpoint area just below the wound where a sutre was harvested that is draining blood and the cause of the drainage. No hemetoma, This was just under the skin and not accessible with a silver nitirite stick. A multi layer dressing was applied  Imaging: No results found. No images are attached to the encounter.  Labs: Lab Results  Component Value Date   HGBA1C 5.6 01/19/2019   HGBA1C 5.9 (H) 11/09/2018   HGBA1C 7.0 (H) 05/29/2018   ESRSEDRATE 45 (H) 01/18/2019   ESRSEDRATE 84 (H) 11/18/2018   CRP 6.2 (H) 01/18/2019   CRP 5.1 (H) 11/18/2018   CRP <0.8 05/29/2018   REPTSTATUS 01/23/2019 FINAL 01/18/2019   CULT  01/18/2019    NO GROWTH 5 DAYS Performed at [facility], [place] [address] [place], [place] [address]    LABORGA ESCHERICHIA COLI (A) 09/28/2018     Lab Results  Component Value Date     ALBUMIN 3.1 (L) 02/04/2019   ALBUMIN 3.4 (L) 02/02/2019   ALBUMIN 3.0 (L) 01/17/2019   PREALBUMIN 11.6 (L) 01/18/2019    Lab Results  Component Value Date   MG 2.1 02/04/2019   MG 2.1 11/20/2018   MG 2.0 11/11/2018   No results found for: [facility]  Lab Results  Component Value Date   PREALBUMIN 11.6 (L) 01/18/2019   CBC EXTENDED Latest Ref Rng & Units 02/04/2019 02/02/2019 02/02/2019  WBC 4.0 - 10.5 K/uL 7.4 - 7.7  RBC 4.22 - 5.81 MIL/uL 2.88(L) - 3.05(L)  HGB 13.0 - 17.0 g/dL 9.1(L) 10.5(L) 9.6(L)  HCT 39.0 - 52.0 % 27.9(L) 31.0(L) 29.5(L)  PLT 150 - 400 K/uL 376 - 382  NEUTROABS 1.7 - 7.7 K/uL 3.3 - 3.7  LYMPHSABS 0.7 - 4.0 K/uL 3.0 - 2.6     There is no height or weight on file to calculate BMI.  Orders:  No orders of the defined types were placed in this encounter.  No orders of the defined types were placed in this encounter.    Procedures: No procedures performed  Clinical Data: No additional findings.  ROS:  All other systems negative, except as noted in the HPI. Review of Systems  Objective: Vital Signs: There were  no vitals taken for this visit.  Specialty Comments:  No specialty comments available.  PMFS History: Patient Active Problem List   Diagnosis Date Noted  . Pressure injury of skin 02/03/2019  . TIA (transient ischemic attack) 02/03/2019  . Transient speech disturbance 02/02/2019  . Chronic indwelling Foley catheter 01/22/2019  . Postoperative wound infection 01/20/2019  . Normocytic anemia 01/18/2019  . Cellulitis 01/18/2019  . Infection of deep incisional surgical site after procedure   . Abnormal urinalysis   . Labile blood glucose   . Urinary retention   . S/P bilateral BKA (below knee amputation) ([place])   . Acute blood loss anemia   . Urinary retention due to benign prostatic hyperplasia 12/28/2018  . Unilateral complete BKA, left, initial encounter ([place]) 12/25/2018  . Dehiscence of amputation stump ([place])   . History of  transmetatarsal amputation of left foot ([place]) 12/09/2018  . Critical limb ischemia with history of revascularization of same extremity   . Gangrene of left foot ([place])   . Diabetic ulcer of toe of left foot associated with type 2 diabetes mellitus ([place])   . Cellulitis of left lower extremity   . Toe infection 11/18/2018  . Peripheral edema   . Acute on chronic diastolic CHF (congestive heart failure) ([place]) 11/09/2018  . S/P BKA (below knee amputation) unilateral, right ([place]) 10/05/2018  . Hyponatremia   . Essential hypertension   . Postoperative pain   . Pain of left heel   . Unable to maintain weight-bearing   . Type 2 diabetes mellitus with diabetic peripheral angiopathy with gangrene ([place])   . Anemia due to acute blood loss   . Chronic kidney disease, stage 3a   . Acquired absence of right leg below knee ([place]) 09/07/2018  . Gangrene of right foot ([place])   . DNR (do not resuscitate) discussion   . Goals of care, counseling/discussion   . Palliative care by specialist   . Type 2 diabetes mellitus with vascular disease ([place])   . Severe protein-calorie malnutrition ([place])   . Ischemic foot 08/20/2018  . Critical lower limb ischemia 08/03/2018  . Type 2 diabetes mellitus with foot ulcer, without long-term current use of insulin ([place])   . Gastroesophageal reflux disease   . Cellulitis of great toe of right foot 05/29/2018  . Atrial fibrillation, chronic   . Chest pain 07/25/2017  . PAF (paroxysmal atrial fibrillation) ([place]) 07/25/2017  . BPH (benign prostatic hyperplasia) 07/25/2017  . Shortness of breath 05/11/2017  . Hypothyroidism 05/11/2017  . Chronic diastolic CHF (congestive heart failure) ([place]) 05/11/2017  . Moderate aortic stenosis 06/12/2016  . RBBB 06/12/2016  . Peripheral vascular disease ([place]) 08/04/2012  . Essential hypertension, benign 08/04/2012  . Hyperlipidemia 08/04/2012  . Dizziness 08/04/2012   Past Medical History:  Diagnosis Date  . Arthritis   .  Borderline diabetic   . Diabetes mellitus without complication ([place])   . Glaucoma   . Hyperlipidemia   . Hypertension   . PVD (peripheral vascular disease) ([place])   . Sleep apnea    Cpap ordered but doesnt use  . Urinary retention due to benign prostatic hyperplasia 12/28/2018    Family History  Problem Relation Age of Onset  . Cancer Mother   . Heart attack Father   . Stroke Father   . Heart attack Brother   . Heart attack Brother     Past Surgical History:  Procedure Laterality Date  . ABDOMINAL AORTOGRAM W/LOWER EXTREMITY N/A 07/27/2018   Procedure: ABDOMINAL  AORTOGRAM W/LOWER EXTREMITY;  Surgeon: Lorretta Harp, MD;  Location: [facility];  Service: Cardiovascular;  Laterality: N/A;  . AMPUTATION Right 08/28/2018   Procedure: RIGHT BELOW KNEE AMPUTATION;  Surgeon: [name], MD;  Location: [facility];  Service: Orthopedics;  Laterality: Right;  . AMPUTATION Left 12/02/2018   Procedure: LEFT TRANSMETATARSAL AMPUTATION AND NEGATIVE PRESSURE WOUND VAC PLACEMENT;  Surgeon: [name], MD;  Location: [place];  Service: Orthopedics;  Laterality: Left;  . AMPUTATION Left 12/18/2018   Procedure: LEFT BELOW KNEE AMPUTATION;  Surgeon: [name], MD;  Location: [facility];  Service: Orthopedics;  Laterality: Left;  . APPENDECTOMY  1943  . BELOW KNEE LEG AMPUTATION Left 12/18/2018  . CATARACT EXTRACTION  2012   x2  . COLONOSCOPY N/A 11/01/2014   Procedure: COLONOSCOPY;  Surgeon: Aviva Signs, MD;  [facility];  Service: Gastroenterology;  Laterality: N/A;  . ESOPHAGOGASTRODUODENOSCOPY N/A 11/01/2014   Procedure: ESOPHAGOGASTRODUODENOSCOPY (EGD);  Surgeon: Aviva Signs, MD;  [facility];  Service: Gastroenterology;  Laterality: N/A;  . HERNIA REPAIR Left   . INGUINAL HERNIA REPAIR Right 03/01/2013   Procedure: RIGHT INGUINAL HERNIORRHAPHY;  Surgeon: Jamesetta So, MD;  [facility];  Service: General;  Laterality: Right;  . INSERTION OF MESH Right  03/01/2013   Procedure: INSERTION OF MESH;  Surgeon: Jamesetta So, MD;  [facility];  Service: General;  Laterality: Right;  . LOWER EXTREMITY ANGIOGRAPHY Right 08/25/2018   Procedure: LOWER EXTREMITY ANGIOGRAPHY;  Surgeon: Wellington Hampshire, MD;  Location: [facility];  Service: Cardiovascular;  Laterality: Right;  . LOWER EXTREMITY ANGIOGRAPHY N/A 11/25/2018   Procedure: LOWER EXTREMITY ANGIOGRAPHY;  Surgeon: Wellington Hampshire, MD;  Location: [facility];  Service: Cardiovascular;  Laterality: N/A;  . NM MYOCAR PERF WALL MOTION  06/05/2010   no significant ischemia  . PERIPHERAL VASCULAR ATHERECTOMY Right 08/03/2018   Procedure: PERIPHERAL VASCULAR ATHERECTOMY;  Surgeon: Lorretta Harp, MD;  Location: [facility];  Service: Cardiovascular;  Laterality: Right;  . PERIPHERAL VASCULAR ATHERECTOMY Left 11/23/2018   Procedure: PERIPHERAL VASCULAR ATHERECTOMY;  Surgeon: Lorretta Harp, MD;  Location: [facility];  Service: Cardiovascular;  Laterality: Left;  sfa with dc balloon  . PERIPHERAL VASCULAR BALLOON ANGIOPLASTY Left 11/25/2018   Procedure: PERIPHERAL VASCULAR BALLOON ANGIOPLASTY;  Surgeon: Wellington Hampshire, MD;  Location: [facility];  Service: Cardiovascular;  Laterality: Left;  popliteal  . PERIPHERAL VASCULAR INTERVENTION Left 11/25/2018   Procedure: PERIPHERAL VASCULAR INTERVENTION;  Surgeon: Wellington Hampshire, MD;  Location: [facility];  Service: Cardiovascular;  Laterality: Left;  tibial peroneal trunk and peroneal stents   . stent  06/14/2010   left leg  . STUMP REVISION Left 01/20/2019   Procedure: REVISION LEFT BELOW KNEE AMPUTATION;  Surgeon: [name], MD;  Location: [facility];  Service: Orthopedics;  Laterality: Left;  . US ECHOCARDIOGRAPHY  01/21/2006   moderate mitral annular ca+, mild MR, AOV moderately sclerotic.   Social History   Occupational History  . Not on file  Tobacco Use  . Smoking status: Former Smoker     Quit date: 01/13/1965    Years since quitting: 54.1  . Smokeless tobacco: Former Systems developer    Types: Chew  Substance and Sexual Activity  . Alcohol use: No  . Drug use: No  . Sexual activity: Not Currently

## 2019-02-12 ENCOUNTER — Other Ambulatory Visit: Payer: Self-pay | Admitting: *Deleted

## 2019-02-12 NOTE — Patient Outreach (Signed)
Talked with son, Lovell Nuttall for short time. He verifies that his Dad had a rough day. He went to orthopedics. Sutures were removed and then after arriving home he started bleeding and son took his back to orthopedics. Bleeding was stopped.  He says his father had a really good day yesterday and he is usually not in any pain. If he has pain he takes a tylenol.  He continues to get home health nursing and PT/OT.  Son advised that I need to call his father's home number next Friday and talk to pt's daughter, [name].  Eulah Pont. Myrtie Neither, MSN, [facility] Gerontological Nurse Practitioner [facility] Care Management [phone]

## 2019-02-13 ENCOUNTER — Other Ambulatory Visit: Payer: Self-pay | Admitting: Family

## 2019-02-13 ENCOUNTER — Other Ambulatory Visit: Payer: Self-pay | Admitting: Physical Medicine and Rehabilitation

## 2019-02-15 ENCOUNTER — Encounter: Payer: Medicare Other | Attending: Registered Nurse | Admitting: Physical Medicine and Rehabilitation

## 2019-02-15 ENCOUNTER — Encounter: Payer: Self-pay | Admitting: Physical Medicine and Rehabilitation

## 2019-02-15 ENCOUNTER — Other Ambulatory Visit: Payer: Self-pay

## 2019-02-15 VITALS — BP 152/61 | Ht 71.0 in | Wt 188.0 lb

## 2019-02-15 DIAGNOSIS — R339 Retention of urine, unspecified: Secondary | ICD-10-CM | POA: Diagnosis not present

## 2019-02-15 DIAGNOSIS — L03116 Cellulitis of left lower limb: Secondary | ICD-10-CM

## 2019-02-15 DIAGNOSIS — S88112A Complete traumatic amputation at level between knee and ankle, left lower leg, initial encounter: Secondary | ICD-10-CM | POA: Insufficient documentation

## 2019-02-15 DIAGNOSIS — I480 Paroxysmal atrial fibrillation: Secondary | ICD-10-CM | POA: Insufficient documentation

## 2019-02-15 DIAGNOSIS — G459 Transient cerebral ischemic attack, unspecified: Secondary | ICD-10-CM | POA: Diagnosis not present

## 2019-02-15 DIAGNOSIS — Z89512 Acquired absence of left leg below knee: Secondary | ICD-10-CM | POA: Diagnosis not present

## 2019-02-15 DIAGNOSIS — Z89511 Acquired absence of right leg below knee: Secondary | ICD-10-CM | POA: Insufficient documentation

## 2019-02-15 DIAGNOSIS — I5032 Chronic diastolic (congestive) heart failure: Secondary | ICD-10-CM | POA: Insufficient documentation

## 2019-02-15 MED ORDER — TRAMADOL HCL 50 MG PO TABS: 50.0000 mg | ORAL_TABLET | Freq: Four times a day (QID) | ORAL | 0 refills | Status: DC | PRN

## 2019-02-15 MED ORDER — APIXABAN 5 MG PO TABS: 5.0000 mg | ORAL_TABLET | Freq: Two times a day (BID) | ORAL | 5 refills | Status: DC

## 2019-02-15 NOTE — Progress Notes (Signed)
Subjective:    Patient ID: [name], male    DOB: Dec 11, 1927, 86 y.o.   MRN: [id number]  HPI   Things going pretty well- overall.  Lives in [place]- 20 miles from [place]- called EMG due to Sx's of CVA- went to ER in [place], [place]. Might have had a TIA- sodium was low and dehydrated. On lasix 40 mg daily. Kept at hospital in 24 hours- back to self when went home.   BP was really high after TIA- 1 day later- 200s/100s Put on BP meds- BP coming down pretty well.- 152/63 with pulse of 55.  BG 88 this AM.  They went back in with Dr Sharol Given and took an extra inch of bone and 2 inches of skin- after got IV ABX.   Last week- got 1/2 of staples/sutures removed- at Dr Jess Barters office.  Had a lot of bleeding- congealed blood in bottom of limb guard- 1 inch of congealed blood - on Eliquis- heavy flow out of 1 spot- tried silver stick to cauterize- but didn't stop it, so put pressure dressing on it x 3 layers and put on oral ABX x 10 days were prescribed since it was also more red and hurting.   Also gave more tramadol to help with pain that kept having more- released pressure dressing when got home- to help pain.  By next morning it had stopped bleeding with tiny piece of silver dressing put on it.   A little drainage from 1 little area now- a little bloody drainage-   Pt's daughter was there over a weekend and now son had a break.     Next week to see Urologist-    Still has foley catheter-    Last week-  Pain Inventory Average Pain 5 Pain Right Now 3 My pain is dull and aching  In the last 24 hours, has pain interfered with the following? General activity 0 Relation with others 0 Enjoyment of life 0 What TIME of day is your pain at its worst? varies Sleep (in general) Good  Pain is worse with: unsure Pain improves with: rest Relief from Meds: 5  Mobility use a wheelchair needs help with transfers  Function retired I need assistance with the  following:  toileting, meal prep, household duties and shopping  Neuro/Psych bladder control problems numbness  Prior Studies CT/MRI  Physicians involved in your care ED   Family History  Problem Relation Age of Onset  . Cancer Mother   . Heart attack Father   . Stroke Father   . Heart attack Brother   . Heart attack Brother    Social History   Socioeconomic History  . Marital status: Widowed    Spouse name: Not on file  . Number of children: Not on file  . Years of education: Not on file  . Highest education level: Not on file  Occupational History  . Not on file  Tobacco Use  . Smoking status: Former Smoker    Quit date: 01/13/1965    Years since quitting: 54.1  . Smokeless tobacco: Former Systems developer    Types: Chew  Substance and Sexual Activity  . Alcohol use: No  . Drug use: No  . Sexual activity: Not Currently  Other Topics Concern  . Not on file  Social History Narrative    Has 2 children! Very supportive.    Social Determinants of Health   Financial Resource Strain: Low Risk   . Difficulty of Paying Living  Expenses: Not hard at all  Food Insecurity: No Food Insecurity  . Worried About Charity fundraiser in the Last Year: Never true  . Ran Out of Food in the Last Year: Never true  Transportation Needs: No Transportation Needs  . Lack of Transportation (Medical): No  . Lack of Transportation (Non-Medical): No  Physical Activity: Inactive  . Days of Exercise per Week: 0 days  . Minutes of Exercise per Session: 0 min  Stress: Stress Concern Present  . Feeling of Stress : To some extent  Social Connections: Moderately Isolated  . Frequency of Communication with Friends and Family: More than three times a week  . Frequency of Social Gatherings with Friends and Family: More than three times a week  . Attends Religious Services: Never  . Active Member of Clubs or Organizations: No  . Attends Archivist Meetings: Never  . Marital Status: Widowed     Past Surgical History:  Procedure Laterality Date  . ABDOMINAL AORTOGRAM W/LOWER EXTREMITY N/A 07/27/2018   Procedure: ABDOMINAL AORTOGRAM W/LOWER EXTREMITY;  Surgeon: Lorretta Harp, MD;  Location: [place] [facility];  Service: Cardiovascular;  Laterality: N/A;  . AMPUTATION Right 08/28/2018   Procedure: RIGHT BELOW KNEE AMPUTATION;  Surgeon: [name], MD;  Location: [place];  Service: Orthopedics;  Laterality: Right;  . AMPUTATION Left 12/02/2018   Procedure: LEFT TRANSMETATARSAL AMPUTATION AND NEGATIVE PRESSURE WOUND VAC PLACEMENT;  Surgeon: [name], MD;  Location: [place];  Service: Orthopedics;  Laterality: Left;  . AMPUTATION Left 12/18/2018   Procedure: LEFT BELOW KNEE AMPUTATION;  Surgeon: [name], MD;  Location: [place];  Service: Orthopedics;  Laterality: Left;  . APPENDECTOMY  1943  . BELOW KNEE LEG AMPUTATION Left 12/18/2018  . CATARACT EXTRACTION  2012   x2  . COLONOSCOPY N/A 11/01/2014   Procedure: COLONOSCOPY;  Surgeon: Aviva Signs, MD;  [facility];  Service: Gastroenterology;  Laterality: N/A;  . ESOPHAGOGASTRODUODENOSCOPY N/A 11/01/2014   Procedure: ESOPHAGOGASTRODUODENOSCOPY (EGD);  Surgeon: Aviva Signs, MD;  [facility];  Service: Gastroenterology;  Laterality: N/A;  . HERNIA REPAIR Left   . INGUINAL HERNIA REPAIR Right 03/01/2013   Procedure: RIGHT INGUINAL HERNIORRHAPHY;  Surgeon: Jamesetta So, MD;  [facility];  Service: General;  Laterality: Right;  . INSERTION OF MESH Right 03/01/2013   Procedure: INSERTION OF MESH;  Surgeon: Jamesetta So, MD;  [facility];  Service: General;  Laterality: Right;  . LOWER EXTREMITY ANGIOGRAPHY Right 08/25/2018   Procedure: LOWER EXTREMITY ANGIOGRAPHY;  Surgeon: Wellington Hampshire, MD;  Location: [place];  Service: Cardiovascular;  Laterality: Right;  . LOWER EXTREMITY ANGIOGRAPHY N/A 11/25/2018   Procedure: LOWER EXTREMITY ANGIOGRAPHY;  Surgeon: Wellington Hampshire, MD;   Location: [place] [facility];  Service: Cardiovascular;  Laterality: N/A;  . NM MYOCAR PERF WALL MOTION  06/05/2010   no significant ischemia  . PERIPHERAL VASCULAR ATHERECTOMY Right 08/03/2018   Procedure: PERIPHERAL VASCULAR ATHERECTOMY;  Surgeon: Lorretta Harp, MD;  Location: [facility];  Service: Cardiovascular;  Laterality: Right;  . PERIPHERAL VASCULAR ATHERECTOMY Left 11/23/2018   Procedure: PERIPHERAL VASCULAR ATHERECTOMY;  Surgeon: Lorretta Harp, MD;  Location: [place] [facility];  Service: Cardiovascular;  Laterality: Left;  sfa with dc balloon  . PERIPHERAL VASCULAR BALLOON ANGIOPLASTY Left 11/25/2018   Procedure: PERIPHERAL VASCULAR BALLOON ANGIOPLASTY;  Surgeon: Wellington Hampshire, MD;  Location: [place] [facility];  Service:  Cardiovascular;  Laterality: Left;  popliteal  . PERIPHERAL VASCULAR INTERVENTION Left 11/25/2018   Procedure: PERIPHERAL VASCULAR INTERVENTION;  Surgeon: Wellington Hampshire, MD;  Location: [facility] [facility];  Service: Cardiovascular;  Laterality: Left;  tibial peroneal trunk and peroneal stents   . stent  06/14/2010   left leg  . STUMP REVISION Left 01/20/2019   Procedure: REVISION LEFT BELOW KNEE AMPUTATION;  Surgeon: [name], MD;  Location: [place];  Service: Orthopedics;  Laterality: Left;  . US ECHOCARDIOGRAPHY  01/21/2006   moderate mitral annular ca+, mild MR, AOV moderately sclerotic.   Past Medical History:  Diagnosis Date  . Arthritis   . Borderline diabetic   . Diabetes mellitus without complication ([place])   . Glaucoma   . Hyperlipidemia   . Hypertension   . PVD (peripheral vascular disease) ([place])   . Sleep apnea    Cpap ordered but doesnt use  . Urinary retention due to benign prostatic hyperplasia 12/28/2018   BP (!) 152/61 Comment: pt reported, virtual visit  Ht 5\' 11"  (1.803 m) Comment: ampute,pt reported, virtual visit  Wt 188 lb (85.3 kg) Comment: pt reported, virtual visit  BMI 26.22 kg/m   Opioid Risk Score:     Fall Risk Score:  `1  Depression screen PHQ 2/9  Depression screen PHQ 2/9 01/12/2019  Decreased Interest 1  Down, Depressed, Hopeless 1  PHQ - 2 Score 2  Altered sleeping 0  Tired, decreased energy 1  Change in appetite 0  Feeling bad or failure about yourself  0  Trouble concentrating 0  Moving slowly or fidgety/restless 0  Suicidal thoughts 0  PHQ-9 Score 3  Difficult doing work/chores Not difficult at all  Some recent data might be hidden    Review of Systems  Genitourinary: Positive for difficulty urinating.  Neurological: Positive for numbness.  All other systems reviewed and are negative.      Objective:   Physical Exam  On phone call      Assessment & Plan:     Spent a total of 40 minutes on appointment- more than 25 minutes on chart review while taking to pt/son as per their requestPatient is an 18 yr old make with pAFIB, B/L BKAs, diastolic CHF for f/u today.  New L BKA s/p revision and s/p 2 rounds of ABX.   1. On PO ABX per Dr Sharol Given  2. Refill tramadol 50 mg QID prn- #60 sent in to pharmacy.  3. Will refill Eliquis 5 mg BID- since should be on higher dose due to TIA? However did EXTENSIVE chart review while on phone- went through every progress note from 11/25 to 12/31- go back to 2.5 mg Eliquis spent 25 minutes on this process.    4.Will have pt f/u prn when he needs prosthesis-  and decided on Eliquis dose to be 2.5 mg BID since couldn't figure out why increased.

## 2019-02-15 NOTE — Patient Instructions (Signed)
Patient is an 86 yr old make with pAFIB, B/L BKAs, diastolic CHF for f/u today.  New L BKA s/p revision and s/p 2 rounds of ABX.   1. On PO ABX per Dr Sharol Given  2. Refill tramadol 50 mg QID prn- #60 sent in to pharmacy.  3. Will refill Eliquis 5 mg BID- since should be on higher dose due to TIA? However did EXTENSIVE chart review while on phone- went through every progress note from 11/25 to 12/31- go back to 2.5 mg Eliquis spent 25 minutes on this process.    4.Will have pt f/u prn when he needs prosthesis-

## 2019-02-15 NOTE — Telephone Encounter (Signed)
Do you want pt to continue? BKA revision 01/20/19 and you started rx on 02/10/19

## 2019-02-16 ENCOUNTER — Telehealth: Payer: Self-pay

## 2019-02-16 NOTE — Telephone Encounter (Signed)
Actually, I specifically told son, [name], that he was to take 2.5 mg BID, after I researched through record that showed he was to be on 2.5 mg BID not 5 mg BID- it only lasts 12 hours, so has to be given BID.

## 2019-02-16 NOTE — Telephone Encounter (Signed)
[name] called stating that since insurance approved Eliquis 5 mg can patient take one 5 mg a day?

## 2019-02-16 NOTE — Telephone Encounter (Signed)
No refill

## 2019-02-17 ENCOUNTER — Telehealth: Payer: Self-pay | Admitting: Orthopedic Surgery

## 2019-02-17 DIAGNOSIS — Z89512 Acquired absence of left leg below knee: Secondary | ICD-10-CM | POA: Diagnosis not present

## 2019-02-17 DIAGNOSIS — T8781 Dehiscence of amputation stump: Secondary | ICD-10-CM | POA: Diagnosis not present

## 2019-02-17 DIAGNOSIS — I130 Hypertensive heart and chronic kidney disease with heart failure and stage 1 through stage 4 chronic kidney disease, or unspecified chronic kidney disease: Secondary | ICD-10-CM | POA: Diagnosis not present

## 2019-02-17 DIAGNOSIS — R338 Other retention of urine: Secondary | ICD-10-CM | POA: Diagnosis not present

## 2019-02-17 DIAGNOSIS — I5032 Chronic diastolic (congestive) heart failure: Secondary | ICD-10-CM | POA: Diagnosis not present

## 2019-02-17 DIAGNOSIS — E1151 Type 2 diabetes mellitus with diabetic peripheral angiopathy without gangrene: Secondary | ICD-10-CM | POA: Diagnosis not present

## 2019-02-17 DIAGNOSIS — H409 Unspecified glaucoma: Secondary | ICD-10-CM | POA: Diagnosis not present

## 2019-02-17 DIAGNOSIS — I480 Paroxysmal atrial fibrillation: Secondary | ICD-10-CM | POA: Diagnosis not present

## 2019-02-17 DIAGNOSIS — N183 Chronic kidney disease, stage 3 unspecified: Secondary | ICD-10-CM | POA: Diagnosis not present

## 2019-02-17 DIAGNOSIS — E1122 Type 2 diabetes mellitus with diabetic chronic kidney disease: Secondary | ICD-10-CM | POA: Diagnosis not present

## 2019-02-17 DIAGNOSIS — E785 Hyperlipidemia, unspecified: Secondary | ICD-10-CM | POA: Diagnosis not present

## 2019-02-17 DIAGNOSIS — M1990 Unspecified osteoarthritis, unspecified site: Secondary | ICD-10-CM | POA: Diagnosis not present

## 2019-02-17 NOTE — Telephone Encounter (Signed)
Son [place] notified.

## 2019-02-17 NOTE — Telephone Encounter (Signed)
[name] was called and given verbal order for Education officer, museum.

## 2019-02-17 NOTE — Telephone Encounter (Signed)
Message from patients son

## 2019-02-17 NOTE — Telephone Encounter (Signed)
Jessica from [facility] called. She would like a verbal order for a medical social worker to speak with patient and family. Her call back number is [phone]

## 2019-02-19 ENCOUNTER — Other Ambulatory Visit: Payer: Self-pay | Admitting: *Deleted

## 2019-02-19 DIAGNOSIS — R338 Other retention of urine: Secondary | ICD-10-CM | POA: Diagnosis not present

## 2019-02-19 DIAGNOSIS — I5032 Chronic diastolic (congestive) heart failure: Secondary | ICD-10-CM | POA: Diagnosis not present

## 2019-02-19 DIAGNOSIS — E785 Hyperlipidemia, unspecified: Secondary | ICD-10-CM | POA: Diagnosis not present

## 2019-02-19 DIAGNOSIS — I130 Hypertensive heart and chronic kidney disease with heart failure and stage 1 through stage 4 chronic kidney disease, or unspecified chronic kidney disease: Secondary | ICD-10-CM | POA: Diagnosis not present

## 2019-02-19 DIAGNOSIS — H409 Unspecified glaucoma: Secondary | ICD-10-CM | POA: Diagnosis not present

## 2019-02-19 DIAGNOSIS — E1122 Type 2 diabetes mellitus with diabetic chronic kidney disease: Secondary | ICD-10-CM | POA: Diagnosis not present

## 2019-02-19 DIAGNOSIS — Z89512 Acquired absence of left leg below knee: Secondary | ICD-10-CM | POA: Diagnosis not present

## 2019-02-19 DIAGNOSIS — E1151 Type 2 diabetes mellitus with diabetic peripheral angiopathy without gangrene: Secondary | ICD-10-CM | POA: Diagnosis not present

## 2019-02-19 DIAGNOSIS — T8781 Dehiscence of amputation stump: Secondary | ICD-10-CM | POA: Diagnosis not present

## 2019-02-19 DIAGNOSIS — I480 Paroxysmal atrial fibrillation: Secondary | ICD-10-CM | POA: Diagnosis not present

## 2019-02-19 DIAGNOSIS — M1990 Unspecified osteoarthritis, unspecified site: Secondary | ICD-10-CM | POA: Diagnosis not present

## 2019-02-19 DIAGNOSIS — N183 Chronic kidney disease, stage 3 unspecified: Secondary | ICD-10-CM | POA: Diagnosis not present

## 2019-02-19 NOTE — Patient Outreach (Signed)
Late entry for 02/18/19: Pt was discussed at Difficult Case Meeting, updating team of pt's progression, continued services from home health. Pt avoided ED visit for stump bleeding after orthopedic appt and treatment. Son took pt back to office for bleeding and this problem was resolved.  Today, daughter, [name] and I discussed pt status today. She reports she did put in a social work request with home health. She was not able to go into detail for the reason of the consult as she is working/teaching. The consult will take place today. She states there has been no complications this week and her father does seem to be progressing.  I will call again in a week and have reinforced that she or any family member can call me for any problems or questions.  Eulah Pont. Myrtie Neither, MSN, [facility] Gerontological Nurse Practitioner [facility] Care Management [phone]

## 2019-02-20 DIAGNOSIS — I130 Hypertensive heart and chronic kidney disease with heart failure and stage 1 through stage 4 chronic kidney disease, or unspecified chronic kidney disease: Secondary | ICD-10-CM | POA: Diagnosis not present

## 2019-02-20 DIAGNOSIS — R338 Other retention of urine: Secondary | ICD-10-CM | POA: Diagnosis not present

## 2019-02-20 DIAGNOSIS — I5032 Chronic diastolic (congestive) heart failure: Secondary | ICD-10-CM | POA: Diagnosis not present

## 2019-02-20 DIAGNOSIS — T8781 Dehiscence of amputation stump: Secondary | ICD-10-CM | POA: Diagnosis not present

## 2019-02-20 DIAGNOSIS — M1990 Unspecified osteoarthritis, unspecified site: Secondary | ICD-10-CM | POA: Diagnosis not present

## 2019-02-20 DIAGNOSIS — E1122 Type 2 diabetes mellitus with diabetic chronic kidney disease: Secondary | ICD-10-CM | POA: Diagnosis not present

## 2019-02-20 DIAGNOSIS — E1151 Type 2 diabetes mellitus with diabetic peripheral angiopathy without gangrene: Secondary | ICD-10-CM | POA: Diagnosis not present

## 2019-02-20 DIAGNOSIS — N183 Chronic kidney disease, stage 3 unspecified: Secondary | ICD-10-CM | POA: Diagnosis not present

## 2019-02-20 DIAGNOSIS — I480 Paroxysmal atrial fibrillation: Secondary | ICD-10-CM | POA: Diagnosis not present

## 2019-02-20 DIAGNOSIS — Z89512 Acquired absence of left leg below knee: Secondary | ICD-10-CM | POA: Diagnosis not present

## 2019-02-20 DIAGNOSIS — E785 Hyperlipidemia, unspecified: Secondary | ICD-10-CM | POA: Diagnosis not present

## 2019-02-20 DIAGNOSIS — H409 Unspecified glaucoma: Secondary | ICD-10-CM | POA: Diagnosis not present

## 2019-02-22 DIAGNOSIS — E785 Hyperlipidemia, unspecified: Secondary | ICD-10-CM | POA: Diagnosis not present

## 2019-02-22 DIAGNOSIS — E1122 Type 2 diabetes mellitus with diabetic chronic kidney disease: Secondary | ICD-10-CM | POA: Diagnosis not present

## 2019-02-22 DIAGNOSIS — E1151 Type 2 diabetes mellitus with diabetic peripheral angiopathy without gangrene: Secondary | ICD-10-CM | POA: Diagnosis not present

## 2019-02-22 DIAGNOSIS — M79672 Pain in left foot: Secondary | ICD-10-CM | POA: Diagnosis not present

## 2019-02-22 DIAGNOSIS — Z89512 Acquired absence of left leg below knee: Secondary | ICD-10-CM | POA: Diagnosis not present

## 2019-02-22 DIAGNOSIS — M1990 Unspecified osteoarthritis, unspecified site: Secondary | ICD-10-CM | POA: Diagnosis not present

## 2019-02-22 DIAGNOSIS — I480 Paroxysmal atrial fibrillation: Secondary | ICD-10-CM | POA: Diagnosis not present

## 2019-02-22 DIAGNOSIS — I5032 Chronic diastolic (congestive) heart failure: Secondary | ICD-10-CM | POA: Diagnosis not present

## 2019-02-22 DIAGNOSIS — T8781 Dehiscence of amputation stump: Secondary | ICD-10-CM | POA: Diagnosis not present

## 2019-02-22 DIAGNOSIS — R338 Other retention of urine: Secondary | ICD-10-CM | POA: Diagnosis not present

## 2019-02-22 DIAGNOSIS — I130 Hypertensive heart and chronic kidney disease with heart failure and stage 1 through stage 4 chronic kidney disease, or unspecified chronic kidney disease: Secondary | ICD-10-CM | POA: Diagnosis not present

## 2019-02-22 DIAGNOSIS — N183 Chronic kidney disease, stage 3 unspecified: Secondary | ICD-10-CM | POA: Diagnosis not present

## 2019-02-22 DIAGNOSIS — H409 Unspecified glaucoma: Secondary | ICD-10-CM | POA: Diagnosis not present

## 2019-02-23 ENCOUNTER — Ambulatory Visit (INDEPENDENT_AMBULATORY_CARE_PROVIDER_SITE_OTHER): Payer: Medicare Other | Admitting: Family

## 2019-02-23 ENCOUNTER — Other Ambulatory Visit: Payer: Self-pay

## 2019-02-23 ENCOUNTER — Encounter: Payer: Self-pay | Admitting: Family

## 2019-02-23 VITALS — Ht 71.0 in | Wt 188.0 lb

## 2019-02-23 DIAGNOSIS — Z89511 Acquired absence of right leg below knee: Secondary | ICD-10-CM

## 2019-02-23 DIAGNOSIS — Z89512 Acquired absence of left leg below knee: Secondary | ICD-10-CM

## 2019-02-23 NOTE — Progress Notes (Signed)
Post-Op Visit Note   Patient: [name]           Date of Birth: 27-Oct-1927           MRN: [id number] Visit Date: 02/23/2019 PCP: Redmond School, MD  Chief Complaint:  Chief Complaint  Patient presents with  . Left Leg - Routine Post Op    01/20/19 left BKA revision     HPI:  HPI The patient is an 86 year old gentleman seen today status post left below-knee amputation.  This is well-healed.  There are a few remaining sutures and staples medially these are harvested today Ortho Exam Incision is well-healed is consolidating well there is no erythema no open area no drainage the remaining sutures and staples medially harvested today without incident.  Visit Diagnoses: No diagnosis found.  Plan: Plan to have him continue with a shrinker and limb protector he will follow with [facility].  Follow-up in the office in 1 month.  Follow-Up Instructions: No follow-ups on file.   Imaging: No results found.  Orders:  No orders of the defined types were placed in this encounter.  No orders of the defined types were placed in this encounter.    PMFS History: Patient Active Problem List   Diagnosis Date Noted  . S/P BKA (below knee amputation) unilateral, left ([place]) 02/15/2019  . Pressure injury of skin 02/03/2019  . TIA (transient ischemic attack) 02/03/2019  . Transient speech disturbance 02/02/2019  . Chronic indwelling Foley catheter 01/22/2019  . Postoperative wound infection 01/20/2019  . Normocytic anemia 01/18/2019  . Cellulitis 01/18/2019  . Infection of deep incisional surgical site after procedure   . Abnormal urinalysis   . Labile blood glucose   . Urinary retention   . S/P bilateral BKA (below knee amputation) ([place])   . Acute blood loss anemia   . Urinary retention due to benign prostatic hyperplasia 12/28/2018  . Unilateral complete BKA, left, initial encounter ([place]) 12/25/2018  . Dehiscence of amputation stump ([place])   . History of transmetatarsal amputation  of left foot ([place]) 12/09/2018  . Critical limb ischemia with history of revascularization of same extremity   . Gangrene of left foot ([place])   . Diabetic ulcer of toe of left foot associated with type 2 diabetes mellitus ([place])   . Cellulitis of left lower extremity   . Toe infection 11/18/2018  . Peripheral edema   . Acute on chronic diastolic CHF (congestive heart failure) ([place]) 11/09/2018  . S/P BKA (below knee amputation) unilateral, right ([place]) 10/05/2018  . Hyponatremia   . Essential hypertension   . Postoperative pain   . Pain of left heel   . Unable to maintain weight-bearing   . Type 2 diabetes mellitus with diabetic peripheral angiopathy with gangrene ([place])   . Anemia due to acute blood loss   . Chronic kidney disease, stage 3a   . Acquired absence of right leg below knee ([place]) 09/07/2018  . Gangrene of right foot ([place])   . DNR (do not resuscitate) discussion   . Goals of care, counseling/discussion   . Palliative care by specialist   . Type 2 diabetes mellitus with vascular disease ([place])   . Severe protein-calorie malnutrition ([place])   . Ischemic foot 08/20/2018  . Critical lower limb ischemia 08/03/2018  . Type 2 diabetes mellitus with foot ulcer, without long-term current use of insulin ([place])   . Gastroesophageal reflux disease   . Cellulitis of great toe of right foot 05/29/2018  . Atrial  fibrillation, chronic   . Chest pain 07/25/2017  . PAF (paroxysmal atrial fibrillation) ([place]) 07/25/2017  . BPH (benign prostatic hyperplasia) 07/25/2017  . Shortness of breath 05/11/2017  . Hypothyroidism 05/11/2017  . Chronic diastolic CHF (congestive heart failure) ([place]) 05/11/2017  . Moderate aortic stenosis 06/12/2016  . RBBB 06/12/2016  . Peripheral vascular disease ([place]) 08/04/2012  . Essential hypertension, benign 08/04/2012  . Hyperlipidemia 08/04/2012  . Dizziness 08/04/2012   Past Medical History:  Diagnosis Date  . Arthritis   . Borderline diabetic   . Diabetes  mellitus without complication ([place])   . Glaucoma   . Hyperlipidemia   . Hypertension   . PVD (peripheral vascular disease) ([place])   . Sleep apnea    Cpap ordered but doesnt use  . Urinary retention due to benign prostatic hyperplasia 12/28/2018    Family History  Problem Relation Age of Onset  . Cancer Mother   . Heart attack Father   . Stroke Father   . Heart attack Brother   . Heart attack Brother     Past Surgical History:  Procedure Laterality Date  . ABDOMINAL AORTOGRAM W/LOWER EXTREMITY N/A 07/27/2018   Procedure: ABDOMINAL AORTOGRAM W/LOWER EXTREMITY;  Surgeon: Lorretta Harp, MD;  Location: [place] [facility];  Service: Cardiovascular;  Laterality: N/A;  . AMPUTATION Right 08/28/2018   Procedure: RIGHT BELOW KNEE AMPUTATION;  Surgeon: [name], MD;  Location: [place];  Service: Orthopedics;  Laterality: Right;  . AMPUTATION Left 12/02/2018   Procedure: LEFT TRANSMETATARSAL AMPUTATION AND NEGATIVE PRESSURE WOUND VAC PLACEMENT;  Surgeon: [name], MD;  Location: [place];  Service: Orthopedics;  Laterality: Left;  . AMPUTATION Left 12/18/2018   Procedure: LEFT BELOW KNEE AMPUTATION;  Surgeon: [name], MD;  Location: [place];  Service: Orthopedics;  Laterality: Left;  . APPENDECTOMY  1943  . BELOW KNEE LEG AMPUTATION Left 12/18/2018  . CATARACT EXTRACTION  2012   x2  . COLONOSCOPY N/A 11/01/2014   Procedure: COLONOSCOPY;  Surgeon: Aviva Signs, MD;  [facility];  Service: Gastroenterology;  Laterality: N/A;  . ESOPHAGOGASTRODUODENOSCOPY N/A 11/01/2014   Procedure: ESOPHAGOGASTRODUODENOSCOPY (EGD);  Surgeon: Aviva Signs, MD;  [facility];  Service: Gastroenterology;  Laterality: N/A;  . HERNIA REPAIR Left   . INGUINAL HERNIA REPAIR Right 03/01/2013   Procedure: RIGHT INGUINAL HERNIORRHAPHY;  Surgeon: Jamesetta So, MD;  [facility];  Service: General;  Laterality: Right;  . INSERTION OF MESH Right 03/01/2013   Procedure: INSERTION OF  MESH;  Surgeon: Jamesetta So, MD;  [facility];  Service: General;  Laterality: Right;  . LOWER EXTREMITY ANGIOGRAPHY Right 08/25/2018   Procedure: LOWER EXTREMITY ANGIOGRAPHY;  Surgeon: Wellington Hampshire, MD;  Location: [place] [facility];  Service: Cardiovascular;  Laterality: Right;  . LOWER EXTREMITY ANGIOGRAPHY N/A 11/25/2018   Procedure: LOWER EXTREMITY ANGIOGRAPHY;  Surgeon: Wellington Hampshire, MD;  Location: [place] [facility];  Service: Cardiovascular;  Laterality: N/A;  . NM MYOCAR PERF WALL MOTION  06/05/2010   no significant ischemia  . PERIPHERAL VASCULAR ATHERECTOMY Right 08/03/2018   Procedure: PERIPHERAL VASCULAR ATHERECTOMY;  Surgeon: Lorretta Harp, MD;  Location: [place] [facility];  Service: Cardiovascular;  Laterality: Right;  . PERIPHERAL VASCULAR ATHERECTOMY Left 11/23/2018   Procedure: PERIPHERAL VASCULAR ATHERECTOMY;  Surgeon: Lorretta Harp, MD;  Location: [place] [facility];  Service: Cardiovascular;  Laterality: Left;  sfa with dc balloon  . PERIPHERAL VASCULAR BALLOON ANGIOPLASTY  Left 11/25/2018   Procedure: PERIPHERAL VASCULAR BALLOON ANGIOPLASTY;  Surgeon: Wellington Hampshire, MD;  Location: [place] [facility];  Service: Cardiovascular;  Laterality: Left;  popliteal  . PERIPHERAL VASCULAR INTERVENTION Left 11/25/2018   Procedure: PERIPHERAL VASCULAR INTERVENTION;  Surgeon: Wellington Hampshire, MD;  Location: [place] [facility];  Service: Cardiovascular;  Laterality: Left;  tibial peroneal trunk and peroneal stents   . stent  06/14/2010   left leg  . STUMP REVISION Left 01/20/2019   Procedure: REVISION LEFT BELOW KNEE AMPUTATION;  Surgeon: [name], MD;  Location: [place];  Service: Orthopedics;  Laterality: Left;  . US ECHOCARDIOGRAPHY  01/21/2006   moderate mitral annular ca+, mild MR, AOV moderately sclerotic.   Social History   Occupational History  . Not on file  Tobacco Use  . Smoking status: Former Smoker    Quit date: 01/13/1965    Years since  quitting: 54.1  . Smokeless tobacco: Former Systems developer    Types: Chew  Substance and Sexual Activity  . Alcohol use: No  . Drug use: No  . Sexual activity: Not Currently

## 2019-02-24 DIAGNOSIS — I5032 Chronic diastolic (congestive) heart failure: Secondary | ICD-10-CM | POA: Diagnosis not present

## 2019-02-24 DIAGNOSIS — Z89512 Acquired absence of left leg below knee: Secondary | ICD-10-CM | POA: Diagnosis not present

## 2019-02-24 DIAGNOSIS — R3914 Feeling of incomplete bladder emptying: Secondary | ICD-10-CM | POA: Diagnosis not present

## 2019-02-24 DIAGNOSIS — M1990 Unspecified osteoarthritis, unspecified site: Secondary | ICD-10-CM | POA: Diagnosis not present

## 2019-02-24 DIAGNOSIS — I130 Hypertensive heart and chronic kidney disease with heart failure and stage 1 through stage 4 chronic kidney disease, or unspecified chronic kidney disease: Secondary | ICD-10-CM | POA: Diagnosis not present

## 2019-02-24 DIAGNOSIS — E1122 Type 2 diabetes mellitus with diabetic chronic kidney disease: Secondary | ICD-10-CM | POA: Diagnosis not present

## 2019-02-24 DIAGNOSIS — N183 Chronic kidney disease, stage 3 unspecified: Secondary | ICD-10-CM | POA: Diagnosis not present

## 2019-02-24 DIAGNOSIS — R338 Other retention of urine: Secondary | ICD-10-CM | POA: Diagnosis not present

## 2019-02-24 DIAGNOSIS — I480 Paroxysmal atrial fibrillation: Secondary | ICD-10-CM | POA: Diagnosis not present

## 2019-02-24 DIAGNOSIS — T8781 Dehiscence of amputation stump: Secondary | ICD-10-CM | POA: Diagnosis not present

## 2019-02-24 DIAGNOSIS — H409 Unspecified glaucoma: Secondary | ICD-10-CM | POA: Diagnosis not present

## 2019-02-24 DIAGNOSIS — E785 Hyperlipidemia, unspecified: Secondary | ICD-10-CM | POA: Diagnosis not present

## 2019-02-24 DIAGNOSIS — E1151 Type 2 diabetes mellitus with diabetic peripheral angiopathy without gangrene: Secondary | ICD-10-CM | POA: Diagnosis not present

## 2019-02-25 ENCOUNTER — Other Ambulatory Visit: Payer: Self-pay | Admitting: *Deleted

## 2019-02-25 DIAGNOSIS — I4891 Unspecified atrial fibrillation: Secondary | ICD-10-CM | POA: Diagnosis not present

## 2019-02-25 DIAGNOSIS — T8781 Dehiscence of amputation stump: Secondary | ICD-10-CM | POA: Diagnosis not present

## 2019-02-25 DIAGNOSIS — Z89512 Acquired absence of left leg below knee: Secondary | ICD-10-CM | POA: Diagnosis not present

## 2019-02-25 DIAGNOSIS — I5032 Chronic diastolic (congestive) heart failure: Secondary | ICD-10-CM | POA: Diagnosis not present

## 2019-02-25 DIAGNOSIS — M1990 Unspecified osteoarthritis, unspecified site: Secondary | ICD-10-CM | POA: Diagnosis not present

## 2019-02-25 DIAGNOSIS — I130 Hypertensive heart and chronic kidney disease with heart failure and stage 1 through stage 4 chronic kidney disease, or unspecified chronic kidney disease: Secondary | ICD-10-CM | POA: Diagnosis not present

## 2019-02-25 DIAGNOSIS — E1140 Type 2 diabetes mellitus with diabetic neuropathy, unspecified: Secondary | ICD-10-CM | POA: Diagnosis not present

## 2019-02-25 DIAGNOSIS — I480 Paroxysmal atrial fibrillation: Secondary | ICD-10-CM | POA: Diagnosis not present

## 2019-02-25 DIAGNOSIS — E1151 Type 2 diabetes mellitus with diabetic peripheral angiopathy without gangrene: Secondary | ICD-10-CM | POA: Diagnosis not present

## 2019-02-25 DIAGNOSIS — Z89519 Acquired absence of unspecified leg below knee: Secondary | ICD-10-CM | POA: Diagnosis not present

## 2019-02-25 DIAGNOSIS — N183 Chronic kidney disease, stage 3 unspecified: Secondary | ICD-10-CM | POA: Diagnosis not present

## 2019-02-25 DIAGNOSIS — R338 Other retention of urine: Secondary | ICD-10-CM | POA: Diagnosis not present

## 2019-02-25 DIAGNOSIS — H409 Unspecified glaucoma: Secondary | ICD-10-CM | POA: Diagnosis not present

## 2019-02-25 DIAGNOSIS — E785 Hyperlipidemia, unspecified: Secondary | ICD-10-CM | POA: Diagnosis not present

## 2019-02-25 DIAGNOSIS — L309 Dermatitis, unspecified: Secondary | ICD-10-CM | POA: Diagnosis not present

## 2019-02-25 DIAGNOSIS — E1122 Type 2 diabetes mellitus with diabetic chronic kidney disease: Secondary | ICD-10-CM | POA: Diagnosis not present

## 2019-02-25 NOTE — Patient Outreach (Signed)
Telephone assessment.  Spoke with pt's son, Marcellus Pulliam. He reports his father is doing very well. They went to the orthopedist this week and had the rest of his sutures and staples removed with minimal bleeding. His father continues to receive nursing home health, his PT and OT have been completed.  They went to the urologist yesterday and they were considering a procedure but decided to just keep his foley in for another 3 months with monthly catheter changes.  He will be fit for his newest prosthetic in about another month.  Son reports his father's glucose levels are always within normal fasting range.  Mr. Crickenberger has not had any falls or safety related incidents.  I will call again in one week for follow up.  Eulah Pont. Myrtie Neither, MSN, [facility] Gerontological Nurse Practitioner [facility] Care Management [phone]

## 2019-02-26 DIAGNOSIS — R339 Retention of urine, unspecified: Secondary | ICD-10-CM | POA: Diagnosis not present

## 2019-02-26 DIAGNOSIS — Z87440 Personal history of urinary (tract) infections: Secondary | ICD-10-CM | POA: Diagnosis not present

## 2019-03-05 ENCOUNTER — Other Ambulatory Visit: Payer: Self-pay | Admitting: *Deleted
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
## 2019-03-06 DIAGNOSIS — Z89512 Acquired absence of left leg below knee: Secondary | ICD-10-CM | POA: Diagnosis not present

## 2019-03-06 DIAGNOSIS — I130 Hypertensive heart and chronic kidney disease with heart failure and stage 1 through stage 4 chronic kidney disease, or unspecified chronic kidney disease: Secondary | ICD-10-CM | POA: Diagnosis not present

## 2019-03-06 DIAGNOSIS — E1122 Type 2 diabetes mellitus with diabetic chronic kidney disease: Secondary | ICD-10-CM | POA: Diagnosis not present

## 2019-03-06 DIAGNOSIS — N183 Chronic kidney disease, stage 3 unspecified: Secondary | ICD-10-CM | POA: Diagnosis not present

## 2019-03-06 DIAGNOSIS — I5032 Chronic diastolic (congestive) heart failure: Secondary | ICD-10-CM | POA: Diagnosis not present

## 2019-03-06 DIAGNOSIS — R338 Other retention of urine: Secondary | ICD-10-CM | POA: Diagnosis not present

## 2019-03-06 DIAGNOSIS — E1151 Type 2 diabetes mellitus with diabetic peripheral angiopathy without gangrene: Secondary | ICD-10-CM | POA: Diagnosis not present

## 2019-03-06 DIAGNOSIS — E785 Hyperlipidemia, unspecified: Secondary | ICD-10-CM | POA: Diagnosis not present

## 2019-03-06 DIAGNOSIS — H409 Unspecified glaucoma: Secondary | ICD-10-CM | POA: Diagnosis not present

## 2019-03-06 DIAGNOSIS — T8781 Dehiscence of amputation stump: Secondary | ICD-10-CM | POA: Diagnosis not present

## 2019-03-06 DIAGNOSIS — M1990 Unspecified osteoarthritis, unspecified site: Secondary | ICD-10-CM | POA: Diagnosis not present

## 2019-03-06 DIAGNOSIS — I480 Paroxysmal atrial fibrillation: Secondary | ICD-10-CM | POA: Diagnosis not present

## 2019-03-08 DIAGNOSIS — E1151 Type 2 diabetes mellitus with diabetic peripheral angiopathy without gangrene: Secondary | ICD-10-CM | POA: Diagnosis not present

## 2019-03-08 DIAGNOSIS — E785 Hyperlipidemia, unspecified: Secondary | ICD-10-CM | POA: Diagnosis not present

## 2019-03-08 DIAGNOSIS — N183 Chronic kidney disease, stage 3 unspecified: Secondary | ICD-10-CM | POA: Diagnosis not present

## 2019-03-08 DIAGNOSIS — I5032 Chronic diastolic (congestive) heart failure: Secondary | ICD-10-CM | POA: Diagnosis not present

## 2019-03-08 DIAGNOSIS — I130 Hypertensive heart and chronic kidney disease with heart failure and stage 1 through stage 4 chronic kidney disease, or unspecified chronic kidney disease: Secondary | ICD-10-CM | POA: Diagnosis not present

## 2019-03-08 DIAGNOSIS — Z89512 Acquired absence of left leg below knee: Secondary | ICD-10-CM | POA: Diagnosis not present

## 2019-03-08 DIAGNOSIS — M1990 Unspecified osteoarthritis, unspecified site: Secondary | ICD-10-CM | POA: Diagnosis not present

## 2019-03-08 DIAGNOSIS — I480 Paroxysmal atrial fibrillation: Secondary | ICD-10-CM | POA: Diagnosis not present

## 2019-03-08 DIAGNOSIS — H409 Unspecified glaucoma: Secondary | ICD-10-CM | POA: Diagnosis not present

## 2019-03-08 DIAGNOSIS — T8781 Dehiscence of amputation stump: Secondary | ICD-10-CM | POA: Diagnosis not present

## 2019-03-08 DIAGNOSIS — R338 Other retention of urine: Secondary | ICD-10-CM | POA: Diagnosis not present

## 2019-03-08 DIAGNOSIS — E1122 Type 2 diabetes mellitus with diabetic chronic kidney disease: Secondary | ICD-10-CM | POA: Diagnosis not present

## 2019-03-14 DIAGNOSIS — E1122 Type 2 diabetes mellitus with diabetic chronic kidney disease: Secondary | ICD-10-CM | POA: Diagnosis not present

## 2019-03-14 DIAGNOSIS — N183 Chronic kidney disease, stage 3 unspecified: Secondary | ICD-10-CM | POA: Diagnosis not present

## 2019-03-14 DIAGNOSIS — I129 Hypertensive chronic kidney disease with stage 1 through stage 4 chronic kidney disease, or unspecified chronic kidney disease: Secondary | ICD-10-CM | POA: Diagnosis not present

## 2019-03-14 DIAGNOSIS — I4891 Unspecified atrial fibrillation: Secondary | ICD-10-CM | POA: Diagnosis not present

## 2019-03-19 ENCOUNTER — Other Ambulatory Visit: Payer: Self-pay | Admitting: *Deleted

## 2019-03-19 NOTE — Patient Outreach (Signed)
Telephone outreach:  Talked with Jillene Bucks today, Jafet's son. He reports his father continues to improve in all ways. His stump revision has healed nicely. He had a fitting for his new prothesis this week. He will go see Dr. Sharol Given next week and will get his new prosthetic the following week.  [name], reports his fasting glucose levels are nearly all <120. His father does not have any SOB or abdominal distention. He is not able to weigh.  Son reports his father is well attended to assist with all his ADLs and IADLs and safety supervision.  Pt stable and I will now call in the first week of April to follow up.  Eulah Pont. Myrtie Neither, MSN, [facility] Gerontological Nurse Practitioner [facility] Care Management [phone]

## 2019-03-22 DIAGNOSIS — M79672 Pain in left foot: Secondary | ICD-10-CM | POA: Diagnosis not present

## 2019-03-23 ENCOUNTER — Other Ambulatory Visit: Payer: Self-pay

## 2019-03-23 ENCOUNTER — Ambulatory Visit (INDEPENDENT_AMBULATORY_CARE_PROVIDER_SITE_OTHER): Payer: Medicare Other | Admitting: Family

## 2019-03-23 ENCOUNTER — Encounter: Payer: Self-pay | Admitting: Family

## 2019-03-23 VITALS — Ht 71.0 in | Wt 188.0 lb

## 2019-03-23 DIAGNOSIS — Z89511 Acquired absence of right leg below knee: Secondary | ICD-10-CM

## 2019-03-23 DIAGNOSIS — Z89512 Acquired absence of left leg below knee: Secondary | ICD-10-CM

## 2019-03-23 NOTE — Progress Notes (Signed)
Office Visit Note   Patient: [name]           Date of Birth: July 06, 1927           MRN: [id number] Visit Date: 03/23/2019              Requested by: Redmond School, [place],  [place] [id number] PCP: Redmond School, MD  Chief Complaint  Patient presents with  . Left Leg - Routine Post Op    01/20/19 left BKA revision       HPI: Patient is an 86 year old gentleman who presents in follow-up for left transtibial amputation.  Is 2 months out from surgery and states he has already been fit for his test socket with [facility].  Assessment & Plan: Visit Diagnoses:  1. S/P bilateral BKA (below knee amputation) ([place])     Plan: Will set up appointment with [facility] for gait training.  Patient will follow up with [facility] for his final socket on the left.  Follow-Up Instructions: Return in about 2 months (around 05/23/2019).   Ortho Exam  Patient is alert, oriented, no adenopathy, well-dressed, normal affect, normal respiratory effort. Examination patient has little bit of swelling he has a 3 extra-large stump shrinker there is no redness no cellulitis no drainage no signs of infection.  The residual limb is well consolidated.  Imaging: No results found. No images are attached to the encounter.  Labs: Lab Results  Component Value Date   HGBA1C 5.6 01/19/2019   HGBA1C 5.9 (H) 11/09/2018   HGBA1C 7.0 (H) 05/29/2018   ESRSEDRATE 45 (H) 01/18/2019   ESRSEDRATE 84 (H) 11/18/2018   CRP 6.2 (H) 01/18/2019   CRP 5.1 (H) 11/18/2018   CRP <0.8 05/29/2018   REPTSTATUS 01/23/2019 FINAL 01/18/2019   CULT  01/18/2019    NO GROWTH 5 DAYS Performed at [facility], [place] [address] [place], [place] [address]    LABORGA ESCHERICHIA COLI (A) 09/28/2018     Lab Results  Component Value Date   ALBUMIN 3.1 (L) 02/04/2019   ALBUMIN 3.4 (L) 02/02/2019   ALBUMIN 3.0 (L) 01/17/2019   PREALBUMIN 11.6 (L) 01/18/2019    Lab Results  Component Value Date     MG 2.1 02/04/2019   MG 2.1 11/20/2018   MG 2.0 11/11/2018   No results found for: [facility]  Lab Results  Component Value Date   PREALBUMIN 11.6 (L) 01/18/2019   CBC EXTENDED Latest Ref Rng & Units 02/04/2019 02/02/2019 02/02/2019  WBC 4.0 - 10.5 K/uL 7.4 - 7.7  RBC 4.22 - 5.81 MIL/uL 2.88(L) - 3.05(L)  HGB 13.0 - 17.0 g/dL 9.1(L) 10.5(L) 9.6(L)  HCT 39.0 - 52.0 % 27.9(L) 31.0(L) 29.5(L)  PLT 150 - 400 K/uL 376 - 382  NEUTROABS 1.7 - 7.7 K/uL 3.3 - 3.7  LYMPHSABS 0.7 - 4.0 K/uL 3.0 - 2.6     Body mass index is 26.22 kg/m.  Orders:  No orders of the defined types were placed in this encounter.  No orders of the defined types were placed in this encounter.    Procedures: No procedures performed  Clinical Data: No additional findings.  ROS:  All other systems negative, except as noted in the HPI. Review of Systems  Objective: Vital Signs: Ht 5\' 11"  (1.803 m)   Wt 188 lb (85.3 kg)   BMI 26.22 kg/m   Specialty Comments:  No specialty comments available.  PMFS History: Patient Active Problem List   Diagnosis Date  Noted  . S/P BKA (below knee amputation) unilateral, left ([place]) 02/15/2019  . Pressure injury of skin 02/03/2019  . TIA (transient ischemic attack) 02/03/2019  . Transient speech disturbance 02/02/2019  . Chronic indwelling Foley catheter 01/22/2019  . Postoperative wound infection 01/20/2019  . Normocytic anemia 01/18/2019  . Cellulitis 01/18/2019  . Infection of deep incisional surgical site after procedure   . Abnormal urinalysis   . Labile blood glucose   . Urinary retention   . S/P bilateral BKA (below knee amputation) ([place])   . Acute blood loss anemia   . Urinary retention due to benign prostatic hyperplasia 12/28/2018  . Unilateral complete BKA, left, initial encounter ([place]) 12/25/2018  . Dehiscence of amputation stump ([place])   . History of transmetatarsal amputation of left foot ([place]) 12/09/2018  . Critical limb ischemia with history of  revascularization of same extremity   . Gangrene of left foot ([place])   . Diabetic ulcer of toe of left foot associated with type 2 diabetes mellitus ([place])   . Cellulitis of left lower extremity   . Toe infection 11/18/2018  . Peripheral edema   . Acute on chronic diastolic CHF (congestive heart failure) ([place]) 11/09/2018  . S/P BKA (below knee amputation) unilateral, right ([place]) 10/05/2018  . Hyponatremia   . Essential hypertension   . Postoperative pain   . Pain of left heel   . Unable to maintain weight-bearing   . Type 2 diabetes mellitus with diabetic peripheral angiopathy with gangrene ([place])   . Anemia due to acute blood loss   . Chronic kidney disease, stage 3a   . Acquired absence of right leg below knee ([place]) 09/07/2018  . Gangrene of right foot ([place])   . DNR (do not resuscitate) discussion   . Goals of care, counseling/discussion   . Palliative care by specialist   . Type 2 diabetes mellitus with vascular disease ([place])   . Severe protein-calorie malnutrition ([place])   . Ischemic foot 08/20/2018  . Critical lower limb ischemia 08/03/2018  . Type 2 diabetes mellitus with foot ulcer, without long-term current use of insulin ([place])   . Gastroesophageal reflux disease   . Cellulitis of great toe of right foot 05/29/2018  . Atrial fibrillation, chronic   . Chest pain 07/25/2017  . PAF (paroxysmal atrial fibrillation) ([place]) 07/25/2017  . BPH (benign prostatic hyperplasia) 07/25/2017  . Shortness of breath 05/11/2017  . Hypothyroidism 05/11/2017  . Chronic diastolic CHF (congestive heart failure) ([place]) 05/11/2017  . Moderate aortic stenosis 06/12/2016  . RBBB 06/12/2016  . Peripheral vascular disease ([place]) 08/04/2012  . Essential hypertension, benign 08/04/2012  . Hyperlipidemia 08/04/2012  . Dizziness 08/04/2012   Past Medical History:  Diagnosis Date  . Arthritis   . Borderline diabetic   . Diabetes mellitus without complication ([place])   . Glaucoma   . Hyperlipidemia   .  Hypertension   . PVD (peripheral vascular disease) ([place])   . Sleep apnea    Cpap ordered but doesnt use  . Urinary retention due to benign prostatic hyperplasia 12/28/2018    Family History  Problem Relation Age of Onset  . Cancer Mother   . Heart attack Father   . Stroke Father   . Heart attack Brother   . Heart attack Brother     Past Surgical History:  Procedure Laterality Date  . ABDOMINAL AORTOGRAM W/LOWER EXTREMITY N/A 07/27/2018   Procedure: ABDOMINAL AORTOGRAM W/LOWER EXTREMITY;  Surgeon: Lorretta Harp, MD;  Location: [facility];  Service: Cardiovascular;  Laterality: N/A;  . AMPUTATION Right 08/28/2018   Procedure: RIGHT BELOW KNEE AMPUTATION;  Surgeon: [name], MD;  Location: [facility];  Service: Orthopedics;  Laterality: Right;  . AMPUTATION Left 12/02/2018   Procedure: LEFT TRANSMETATARSAL AMPUTATION AND NEGATIVE PRESSURE WOUND VAC PLACEMENT;  Surgeon: [name], MD;  Location: [facility];  Service: Orthopedics;  Laterality: Left;  . AMPUTATION Left 12/18/2018   Procedure: LEFT BELOW KNEE AMPUTATION;  Surgeon: [name], MD;  Location: [facility];  Service: Orthopedics;  Laterality: Left;  . APPENDECTOMY  1943  . BELOW KNEE LEG AMPUTATION Left 12/18/2018  . CATARACT EXTRACTION  2012   x2  . COLONOSCOPY N/A 11/01/2014   Procedure: COLONOSCOPY;  Surgeon: Aviva Signs, MD;  [facility];  Service: Gastroenterology;  Laterality: N/A;  . ESOPHAGOGASTRODUODENOSCOPY N/A 11/01/2014   Procedure: ESOPHAGOGASTRODUODENOSCOPY (EGD);  Surgeon: Aviva Signs, MD;  [facility];  Service: Gastroenterology;  Laterality: N/A;  . HERNIA REPAIR Left   . INGUINAL HERNIA REPAIR Right 03/01/2013   Procedure: RIGHT INGUINAL HERNIORRHAPHY;  Surgeon: Jamesetta So, MD;  Location: [facility];  Service: General;  Laterality: Right;  . INSERTION OF MESH Right 03/01/2013   Procedure: INSERTION OF MESH;  Surgeon: Jamesetta So, MD;  Location: [facility];  Service: General;   Laterality: Right;  . LOWER EXTREMITY ANGIOGRAPHY Right 08/25/2018   Procedure: LOWER EXTREMITY ANGIOGRAPHY;  Surgeon: Wellington Hampshire, MD;  Location: [facility];  Service: Cardiovascular;  Laterality: Right;  . LOWER EXTREMITY ANGIOGRAPHY N/A 11/25/2018   Procedure: LOWER EXTREMITY ANGIOGRAPHY;  Surgeon: Wellington Hampshire, MD;  Location: [facility];  Service: Cardiovascular;  Laterality: N/A;  . NM MYOCAR PERF WALL MOTION  06/05/2010   no significant ischemia  . PERIPHERAL VASCULAR ATHERECTOMY Right 08/03/2018   Procedure: PERIPHERAL VASCULAR ATHERECTOMY;  Surgeon: Lorretta Harp, MD;  Location: [facility];  Service: Cardiovascular;  Laterality: Right;  . PERIPHERAL VASCULAR ATHERECTOMY Left 11/23/2018   Procedure: PERIPHERAL VASCULAR ATHERECTOMY;  Surgeon: Lorretta Harp, MD;  Location: [facility];  Service: Cardiovascular;  Laterality: Left;  sfa with dc balloon  . PERIPHERAL VASCULAR BALLOON ANGIOPLASTY Left 11/25/2018   Procedure: PERIPHERAL VASCULAR BALLOON ANGIOPLASTY;  Surgeon: Wellington Hampshire, MD;  Location: [facility];  Service: Cardiovascular;  Laterality: Left;  popliteal  . PERIPHERAL VASCULAR INTERVENTION Left 11/25/2018   Procedure: PERIPHERAL VASCULAR INTERVENTION;  Surgeon: Wellington Hampshire, MD;  Location: [facility];  Service: Cardiovascular;  Laterality: Left;  tibial peroneal trunk and peroneal stents   . stent  06/14/2010   left leg  . STUMP REVISION Left 01/20/2019   Procedure: REVISION LEFT BELOW KNEE AMPUTATION;  Surgeon: [name], MD;  Location: [facility];  Service: Orthopedics;  Laterality: Left;  . US ECHOCARDIOGRAPHY  01/21/2006   moderate mitral annular ca+, mild MR, AOV moderately sclerotic.   Social History   Occupational History  . Not on file  Tobacco Use  . Smoking status: Former Smoker    Quit date: 01/13/1965    Years since quitting: 54.2  . Smokeless tobacco: Former Systems developer    Types: Chew    Substance and Sexual Activity  . Alcohol use: No  . Drug use: No  . Sexual activity: Not Currently

## 2019-03-30 DIAGNOSIS — E039 Hypothyroidism, unspecified: Secondary | ICD-10-CM | POA: Diagnosis not present

## 2019-03-30 DIAGNOSIS — Z89512 Acquired absence of left leg below knee: Secondary | ICD-10-CM | POA: Diagnosis not present

## 2019-04-01 ENCOUNTER — Encounter: Payer: Self-pay | Admitting: Physical Therapy

## 2019-04-01 ENCOUNTER — Ambulatory Visit (INDEPENDENT_AMBULATORY_CARE_PROVIDER_SITE_OTHER): Payer: Medicare Other | Admitting: Physical Therapy

## 2019-04-01 ENCOUNTER — Other Ambulatory Visit: Payer: Self-pay

## 2019-04-01 DIAGNOSIS — M6249 Contracture of muscle, multiple sites: Secondary | ICD-10-CM

## 2019-04-01 DIAGNOSIS — M25661 Stiffness of right knee, not elsewhere classified: Secondary | ICD-10-CM

## 2019-04-01 DIAGNOSIS — R2689 Other abnormalities of gait and mobility: Secondary | ICD-10-CM

## 2019-04-01 DIAGNOSIS — R2681 Unsteadiness on feet: Secondary | ICD-10-CM | POA: Diagnosis not present

## 2019-04-01 DIAGNOSIS — M6281 Muscle weakness (generalized): Secondary | ICD-10-CM | POA: Diagnosis not present

## 2019-04-01 DIAGNOSIS — R293 Abnormal posture: Secondary | ICD-10-CM

## 2019-04-01 DIAGNOSIS — M25662 Stiffness of left knee, not elsewhere classified: Secondary | ICD-10-CM

## 2019-04-01 DIAGNOSIS — R601 Generalized edema: Secondary | ICD-10-CM

## 2019-04-01 NOTE — Therapy (Signed)
[facility] [address] [place], [place], [address] Phone: [phone]   Fax:  [phone]  Physical Therapy Evaluation  Patient Details  Name: [name] MRN: [id number] Date of Birth: 22-Sep-1927 Referring Provider (PT): [name], MD   Encounter Date: 04/01/2019  PT End of Session - 04/01/19 2135    Visit Number  1    Number of Visits  50    Date for PT Re-Evaluation  06/30/19    Authorization Type  [employer]HC Medicare    Authorization Time Period  $30 co-pay    PT Start Time  1049    PT Stop Time  1150    PT Time Calculation (min)  61 min    Equipment Utilized During Treatment  Gait belt    Activity Tolerance  Patient tolerated treatment well    Behavior During Therapy  [facility] for tasks assessed/performed       Past Medical History:  Diagnosis Date  . Arthritis   . Borderline diabetic   . Diabetes mellitus without complication ([place])   . Glaucoma   . Hyperlipidemia   . Hypertension   . PVD (peripheral vascular disease) ([place])   . Sleep apnea    Cpap ordered but doesnt use  . Urinary retention due to benign prostatic hyperplasia 12/28/2018    Past Surgical History:  Procedure Laterality Date  . ABDOMINAL AORTOGRAM W/LOWER EXTREMITY N/A 07/27/2018   Procedure: ABDOMINAL AORTOGRAM W/LOWER EXTREMITY;  Surgeon: Lorretta Harp, MD;  Location: [place] [facility];  Service: Cardiovascular;  Laterality: N/A;  . AMPUTATION Right 08/28/2018   Procedure: RIGHT BELOW KNEE AMPUTATION;  Surgeon: [name], MD;  Location: [place];  Service: Orthopedics;  Laterality: Right;  . AMPUTATION Left 12/02/2018   Procedure: LEFT TRANSMETATARSAL AMPUTATION AND NEGATIVE PRESSURE WOUND VAC PLACEMENT;  Surgeon: [name], MD;  Location: [place];  Service: Orthopedics;  Laterality: Left;  . AMPUTATION Left 12/18/2018   Procedure: LEFT BELOW KNEE AMPUTATION;  Surgeon: [name], MD;  Location: [place];  Service: Orthopedics;  Laterality: Left;  .  APPENDECTOMY  1943  . BELOW KNEE LEG AMPUTATION Left 12/18/2018  . CATARACT EXTRACTION  2012   x2  . COLONOSCOPY N/A 11/01/2014   Procedure: COLONOSCOPY;  Surgeon: Aviva Signs, MD;  [facility];  Service: Gastroenterology;  Laterality: N/A;  . ESOPHAGOGASTRODUODENOSCOPY N/A 11/01/2014   Procedure: ESOPHAGOGASTRODUODENOSCOPY (EGD);  Surgeon: Aviva Signs, MD;  [facility];  Service: Gastroenterology;  Laterality: N/A;  . HERNIA REPAIR Left   . INGUINAL HERNIA REPAIR Right 03/01/2013   Procedure: RIGHT INGUINAL HERNIORRHAPHY;  Surgeon: Jamesetta So, MD;  Location: [facility];  Service: General;  Laterality: Right;  . INSERTION OF MESH Right 03/01/2013   Procedure: INSERTION OF MESH;  Surgeon: Jamesetta So, MD;  Location: [facility];  Service: General;  Laterality: Right;  . LOWER EXTREMITY ANGIOGRAPHY Right 08/25/2018   Procedure: LOWER EXTREMITY ANGIOGRAPHY;  Surgeon: Wellington Hampshire, MD;  Location: [place] [facility];  Service: Cardiovascular;  Laterality: Right;  . LOWER EXTREMITY ANGIOGRAPHY N/A 11/25/2018   Procedure: LOWER EXTREMITY ANGIOGRAPHY;  Surgeon: Wellington Hampshire, MD;  Location: [place] [facility];  Service: Cardiovascular;  Laterality: N/A;  . NM MYOCAR PERF WALL MOTION  06/05/2010   no significant ischemia  . PERIPHERAL VASCULAR ATHERECTOMY Right 08/03/2018   Procedure: PERIPHERAL VASCULAR ATHERECTOMY;  Surgeon: Lorretta Harp, MD;  Location: [place] [facility];  Service: Cardiovascular;  Laterality:  Right;  [name] PERIPHERAL VASCULAR ATHERECTOMY Left 11/23/2018   Procedure: PERIPHERAL VASCULAR ATHERECTOMY;  Surgeon: Lorretta Harp, MD;  Location: [place] [facility];  Service: Cardiovascular;  Laterality: Left;  sfa with dc balloon  . PERIPHERAL VASCULAR BALLOON ANGIOPLASTY Left 11/25/2018   Procedure: PERIPHERAL VASCULAR BALLOON ANGIOPLASTY;  Surgeon: Wellington Hampshire, MD;  Location: [place] [facility];  Service: Cardiovascular;  Laterality: Left;   popliteal  . PERIPHERAL VASCULAR INTERVENTION Left 11/25/2018   Procedure: PERIPHERAL VASCULAR INTERVENTION;  Surgeon: Wellington Hampshire, MD;  Location: [place] [facility];  Service: Cardiovascular;  Laterality: Left;  tibial peroneal trunk and peroneal stents   . stent  06/14/2010   left leg  . STUMP REVISION Left 01/20/2019   Procedure: REVISION LEFT BELOW KNEE AMPUTATION;  Surgeon: [name], MD;  Location: [place];  Service: Orthopedics;  Laterality: Left;  . US ECHOCARDIOGRAPHY  01/21/2006   moderate mitral annular ca+, mild MR, AOV moderately sclerotic.    There were no vitals filed for this visit.   Subjective Assessment - 04/01/19 1051    Subjective  This  86 yo male was referred on 03/23/3019 with bilateral BKAs by [name], MD. He underwent a left Transtibial Amputation 12/18/2018 (Transmetatarsal Amputation 12/02/2018) with revision 01/20/2019. History of Right Transtibial Amputation 08/28/2018. He hospitalized 1/19-1/21/2021 with TIA. He got right prosthesis in Nov. 2020 but unable to use. He received left prosthesis 03/30/2019.    Patient is accompained by:  Family member   son, [name]   Pertinent History  Bil TTAs, DM2, PAD, HTN, CHF, A-Fib, TIA, CKDst3    Patient Stated Goals  walk with prostheses in home & in/out house. Son would like him to be able to toilet & get in/out bed by himself.    Currently in Pain?  No/denies         [facility] PT Assessment - 04/01/19 1059      Assessment   Medical Diagnosis  Bilateral Transtibial Amputations    Referring Provider (PT)  [name], MD    Onset Date/Surgical Date  03/30/19   left prosthesis delivery   Hand Dominance  Right    Prior Therapy  HHPT thru Feb 21      Precautions   Precautions  Fall    Precaution Comments  if BP over 140/90 to call PCP      Balance Screen   Has the patient fallen in the past 6 months  No    Has the patient had a decrease in activity level because of a fear of falling?   Yes    Is the  patient reluctant to leave their home because of a fear of falling?   Yes      [place] residence    [place]   lived alone prior to 1st amputation   Available Help at Discharge  Family   3 children stay w/him 24hrs 7 days/wk   Type of [place] entrance    [place]  One level   basement with washer/dryer   [place] - 2 wheels;Walker - standard;Cane - single point;Bedside commode;Wheelchair - manual;Hospital bed   [place],      Prior Function   Level of Independence  Independent;Independent with household mobility with device;Independent with community mobility with device   used RW or walking stick in community, cane in house  Vocation  Retired    Office manager, Oceanographer, going to sports events      Posture/Postural Control   Posture/Postural Control  Postural limitations    Postural Limitations  Rounded Shoulders;Forward head;Flexed trunk      ROM / Strength   AROM / PROM / Strength  PROM;Strength      PROM   Overall PROM   Deficits    Overall PROM Comments  trunk, hips & knees tightness with significantly flexed posture    Right Knee Extension  -23   seated   Left Knee Extension  -24   seated     Strength   Overall Strength  Deficits    Overall Strength Comments  Gross functional testing hips & knees <3/5. Gross weakness in trunk musculature.       Transfers   Transfers  Sit to Stand;Stand to Sit    Sit to Stand  2: Max assist;With upper extremity assist;From chair/3-in-1   pulling on parallel bars   Sit to Stand Details  Manual facilitation for weight shifting;Verbal cues for safe use of DME/AE;Verbal cues for technique    Sit to Stand: Patient Percentage  20%    Stand to Sit  3: Mod assist;With upper extremity assist;To chair/3-in-1   using UEs on //bars   Stand to Sit Details (indicate cue type and reason)  Verbal cues for technique;Manual facilitation for weight  shifting      Ambulation/Gait   Ambulation/Gait  Yes    Ambulation/Gait Assistance  2: Max assist    Ambulation/Gait Assistance Details  PT blocking stance knee, manual facilitation at pelvis for weight shift over pelvis, verbal cues on sequence and upright posture.     Ambulation Distance (Feet)  5 Feet    Assistive device  Parallel bars;Prostheses   bil TTAs prostheses   Gait Pattern  Step-to pattern;Decreased step length - right;Decreased step length - left;Decreased hip/knee flexion - right;Decreased hip/knee flexion - left;Right foot flat;Left foot flat;Right flexed knee in stance;Left flexed knee in stance;Shuffle;Lateral hip instability;Trunk flexed;Poor foot clearance - left;Poor foot clearance - right    Ambulation Surface  Indoor;Level    Gait Comments  HR 58-60 bpm, SpO2 99%      Balance   Balance Assessed  Yes      Static Sitting Balance   Static Sitting - Balance Support  Feet supported   BUEs on patient's lap   Static Sitting - Level of Assistance  5: Stand by assistance    Static Sitting - Comment/# of Minutes  sacral sits with posterior pelvic tilt      Dynamic Sitting Balance   Dynamic Sitting - Balance Support  Feet supported;During functional activity   pulling forward to upright posture from reclined in w/c   Dynamic Sitting - Level of Assistance  4: Min assist    Dynamic Sitting Balance - Compensations  pulling on parallel bars with UEs      Static Standing Balance   Static Standing - Balance Support  Bilateral upper extremity supported   on parallel bars   Static Standing - Level of Assistance  2: Max assist;4: Min assist   MaxA to shift pelvis over feet, MinA to maintain positioned   Static Standing - Comment/# of Minutes  2 minutes once positioned with pelvis over feet MinA to maintain once positioned      Dynamic Standing Balance   Dynamic Standing - Balance Support  Bilateral upper extremity supported;During functional activity   BUEs parallel  bars  w/manual cues wt shift pelvis over feet   Dynamic Standing - Level of Assistance  3: Mod assist    Dynamic Standing - Balance Activities  Head turns;Head nods    Dynamic Standing - Comments  scans with right/left ~45* with only cervical motion, maintains forward head with flexion, unable to extend head to full upright.       Prosthetics Assessment - 04/01/19 1059      Prosthetics   Prosthetic Care Dependent with  Skin check;Residual limb care;Care of non-amputated limb;Prosthetic cleaning;Ply sock cleaning;Correct ply sock adjustment;Proper wear schedule/adjustment;Proper weight-bearing schedule/adjustment    Donning prosthesis   +1 Total assist    Doffing prosthesis   +1 Total assist    Current prosthetic wear tolerance (days/week)   daily, Left prosthesis 3 of 3 days, Right daily over last month & most days over 4 months    Current prosthetic wear tolerance (#hours/day)   up to 1 hr at time, ~4 x/day    Current prosthetic weight-bearing tolerance (hours/day)   2 minutes standing & gait activities with no c/o pain or discomfort in limbs.     Edema  pitting edema    Residual limb condition   left limb: medial incision has rough circular area that may internal suture working out & redness at tibial tuberosity and medial hamstring tendon;  bilateral limbs: frail skin, normal moisture & temperature, minimal to no hair growth.     Prosthesis Description  bilateral prostheses with silicon gel liner with 74mm anterior portion with pin lock suspension, using shrinker sock under liner due to skin reaction with initial right liner wear, SACH foot, right 3-ply fit & left 1-ply fit,    K code/activity level with prosthetic use   K2 basic community                Objective measurements completed on examination: See above findings.      [facility] Adult PT Treatment/Exercise - 04/01/19 1059      Prosthetics   Prosthetic Care Comments   wear daily 1 hr (use timer) 4-6 x/day.  change shrinker sock if  damp feeling, sit with prosthetic feet supported,     Education Provided  Skin check;Residual limb care;Prosthetic cleaning;Correct ply sock adjustment;Proper Donning;Proper Doffing;Proper wear schedule/adjustment;Proper weight-bearing schedule/adjustment;Other (comment)   see prosthetic care comments   Person(s) Educated  Patient;Child(ren)    Education Method  Explanation;Demonstration;Tactile cues;Verbal cues    Education Method  Verbalized understanding;Needs further instruction               PT Short Term Goals - 04/01/19 2200      PT SHORT TERM GOAL #1   Title  Patient's son demonstrates proper donning & verbalizes proper cleaning. (All STGs Target Date: 04/30/2019)    Time  4    Period  Weeks    Status  New    Target Date  04/30/19      PT SHORT TERM GOAL #2   Title  Patient tolerates prostheses wear >8 hrs total / day without skin issues.    Time  4    Period  Weeks    Status  New    Target Date  04/30/19      PT SHORT TERM GOAL #3   Title  Sit to / from stand w/c to parallel bars pulling on bars with modA.    Time  4    Period  Weeks    Status  New    Target  Date  04/30/19      PT SHORT TERM GOAL #4   Title  Patient tolerates standing with parallel bar support with minA for 3 minutes.    Time  4    Period  Weeks    Status  New    Target Date  04/30/19      PT SHORT TERM GOAL #5   Title  Patient ambulates 10' in parallel bars with modA.    Time  4    Period  Weeks    Status  New    Target Date  04/30/19        PT Long Term Goals - 04/01/19 2207      PT LONG TERM GOAL #1   Title  Patient and family verbalize and demonstrate understanding of proper prosthetic care to enable safe utilization of prostheses. (All LTGs Target Date: 09/24/2019)    Time  6    Period  Months    Status  New    Target Date  09/24/19      PT LONG TERM GOAL #2   Title  Patient tolerates wear of bilateral prostheses >90% of awake hours without skin issues or limb pain to  enable functional potential during his day.    Time  6    Period  Months    Status  New    Target Date  09/24/19      PT LONG TERM GOAL #3   Title  Squat pivot transfer bed to w/c or chair to w/c with prostheses modified independent.    Time  6    Period  Months    Status  New    Target Date  09/24/19      PT LONG TERM GOAL #4   Title  Sit to / from stand elevated w/c to walker with prostheses with supervision.    Time  6    Period  Months    Status  New    Target Date  09/24/19      PT LONG TERM GOAL #5   Title  Standing balance with walker support with bilateral prostheses static stance for 5 minutes, scanning environment and reaches 2" anteriorly with supervision.    Time  6    Period  Months    Status  New    Target Date  09/24/19      Additional Long Term Goals   Additional Long Term Goals  Yes      PT LONG TERM GOAL #6   Title  Patient ambulates 36' between 2 chairs including turning 90* to position to sit with RW & prostheses with minA from family safely.    Time  6    Period  Months    Status  New    Target Date  09/24/19             Plan - 04/01/19 2141    Clinical Impression Statement  This 86yo male underwent a right Transtibial Amputation 08/28/2018. Before he could begin prosthetic training, he underwent a left partial foot then Transtibial Amputation. He received his left prosthesis 2 days prior to PT evaluation. He has been non-ambulatory for >9 months leading to further deconditioning in addition to his chronic medical conditions. His family & he are dependent in prosthetic care and with his frail skin condition is higher risk for breakdown.  Patient has tightness in hips, knees & trunk with contractures causing flexed posture. Sit to / from stand with patient pulling  on parallel bars requires maxA with patient performing ~20% of tasks. He requires mod-maxA to position pelvis over feet then minimal assist to maintain static upright in parallel bars.  Patient ambulated 5' in parallel bars with maxA with PT blocking stance knee with significant gait deviations. Patient will take significant time to rehab to maximal potential.    Personal Factors and Comorbidities  Age;Comorbidity 3+;Fitness;Time since onset of injury/illness/exacerbation    Comorbidities  Bil TTAs, DM2, PAD, HTN, CHF, A-Fib, TIA, CKDst3,    Examination-Activity Limitations  Locomotion Level;Sit;Stand;Toileting;Transfers    Examination-Participation Restrictions  Community Activity    Stability/Clinical Decision Making  Evolving/Moderate complexity    Clinical Decision Making  Moderate    Rehab Potential  Good    PT Frequency  2x / week    PT Duration  Other (comment)   25 weeks (6 months)   PT Treatment/Interventions  ADLs/Self Care Home Management;DME Instruction;Gait training;Stair training;Functional mobility training;Therapeutic activities;Therapeutic exercise;Balance training;Neuromuscular re-education;Patient/family education;Prosthetic Training;Manual techniques;Passive range of motion;Vestibular    PT Next Visit Plan  review prosthetic care, HEP for flexibility & basic strength with family assist, sit to/from stand & standing in parallel bars    Consulted and Agree with Plan of Care  Patient;Family member/caregiver    Family Member Consulted  son, [name]       Patient will benefit from skilled therapeutic intervention in order to improve the following deficits and impairments:  Abnormal gait, Cardiopulmonary status limiting activity, Decreased activity tolerance, Decreased balance, Decreased endurance, Decreased knowledge of use of DME, Decreased mobility, Decreased range of motion, Decreased scar mobility, Decreased strength, Increased edema, Impaired flexibility, Impaired UE functional use, Postural dysfunction, Prosthetic Dependency, Pain  Visit Diagnosis: Other abnormalities of gait and mobility  Unsteadiness on feet  Abnormal posture  Muscle weakness  (generalized)  Contracture of muscle, multiple sites  Stiffness of right knee, not elsewhere classified  Stiffness of left knee, not elsewhere classified  Generalized edema     Problem List Patient Active Problem List   Diagnosis Date Noted  . S/P BKA (below knee amputation) unilateral, left ([place]) 02/15/2019  . Pressure injury of skin 02/03/2019  . TIA (transient ischemic attack) 02/03/2019  . Transient speech disturbance 02/02/2019  . Chronic indwelling Foley catheter 01/22/2019  . Postoperative wound infection 01/20/2019  . Normocytic anemia 01/18/2019  . Cellulitis 01/18/2019  . Infection of deep incisional surgical site after procedure   . Abnormal urinalysis   . Labile blood glucose   . Urinary retention   . S/P bilateral BKA (below knee amputation) ([place])   . Acute blood loss anemia   . Urinary retention due to benign prostatic hyperplasia 12/28/2018  . Unilateral complete BKA, left, initial encounter ([place]) 12/25/2018  . Dehiscence of amputation stump ([place])   . History of transmetatarsal amputation of left foot ([place]) 12/09/2018  . Critical limb ischemia with history of revascularization of same extremity   . Gangrene of left foot ([place])   . Diabetic ulcer of toe of left foot associated with type 2 diabetes mellitus ([place])   . Cellulitis of left lower extremity   . Toe infection 11/18/2018  . Peripheral edema   . Acute on chronic diastolic CHF (congestive heart failure) ([place]) 11/09/2018  . S/P BKA (below knee amputation) unilateral, right ([place]) 10/05/2018  . Hyponatremia   . Essential hypertension   . Postoperative pain   . Pain of left heel   . Unable to maintain weight-bearing   . Type 2 diabetes mellitus with  diabetic peripheral angiopathy with gangrene ([place])   . Anemia due to acute blood loss   . Chronic kidney disease, stage 3a   . Acquired absence of right leg below knee ([place]) 09/07/2018  . Gangrene of right foot ([place])   . DNR (do not resuscitate)  discussion   . Goals of care, counseling/discussion   . Palliative care by specialist   . Type 2 diabetes mellitus with vascular disease ([place])   . Severe protein-calorie malnutrition ([place])   . Ischemic foot 08/20/2018  . Critical lower limb ischemia 08/03/2018  . Type 2 diabetes mellitus with foot ulcer, without long-term current use of insulin ([place])   . Gastroesophageal reflux disease   . Cellulitis of great toe of right foot 05/29/2018  . Atrial fibrillation, chronic   . Chest pain 07/25/2017  . PAF (paroxysmal atrial fibrillation) ([place]) 07/25/2017  . BPH (benign prostatic hyperplasia) 07/25/2017  . Shortness of breath 05/11/2017  . Hypothyroidism 05/11/2017  . Chronic diastolic CHF (congestive heart failure) ([place]) 05/11/2017  . Moderate aortic stenosis 06/12/2016  . RBBB 06/12/2016  . Peripheral vascular disease ([place]) 08/04/2012  . Essential hypertension, benign 08/04/2012  . Hyperlipidemia 08/04/2012  . Dizziness 08/04/2012    Jamey Reas  PT, DPT 04/01/2019, 10:22 PM  [facility] [address] [place], [place], [address] Phone: [phone]   Fax:  [phone]  Name: [name] MRN: [id number] Date of Birth: [birth year]

## 2019-04-04 ENCOUNTER — Other Ambulatory Visit: Payer: Self-pay | Admitting: Internal Medicine

## 2019-04-05 ENCOUNTER — Other Ambulatory Visit: Payer: Self-pay

## 2019-04-05 ENCOUNTER — Ambulatory Visit (INDEPENDENT_AMBULATORY_CARE_PROVIDER_SITE_OTHER): Payer: Medicare Other | Admitting: Physical Therapy

## 2019-04-05 ENCOUNTER — Encounter: Payer: Medicare Other | Admitting: Physical Therapy

## 2019-04-05 ENCOUNTER — Encounter: Payer: Self-pay | Admitting: Physical Therapy

## 2019-04-05 DIAGNOSIS — M25661 Stiffness of right knee, not elsewhere classified: Secondary | ICD-10-CM

## 2019-04-05 DIAGNOSIS — M6281 Muscle weakness (generalized): Secondary | ICD-10-CM

## 2019-04-05 DIAGNOSIS — R2689 Other abnormalities of gait and mobility: Secondary | ICD-10-CM | POA: Diagnosis not present

## 2019-04-05 DIAGNOSIS — R293 Abnormal posture: Secondary | ICD-10-CM

## 2019-04-05 DIAGNOSIS — M25662 Stiffness of left knee, not elsewhere classified: Secondary | ICD-10-CM

## 2019-04-05 DIAGNOSIS — R601 Generalized edema: Secondary | ICD-10-CM

## 2019-04-05 DIAGNOSIS — Z9181 History of falling: Secondary | ICD-10-CM

## 2019-04-05 DIAGNOSIS — M6249 Contracture of muscle, multiple sites: Secondary | ICD-10-CM

## 2019-04-05 DIAGNOSIS — R531 Weakness: Secondary | ICD-10-CM | POA: Insufficient documentation

## 2019-04-05 DIAGNOSIS — R2681 Unsteadiness on feet: Secondary | ICD-10-CM

## 2019-04-05 NOTE — Patient Instructions (Signed)
Sitting at edge of w/c so both heels / feet are touching the ground and back away from back of w/c. 1-2 x/day.  Initially do 5 reps each & build up to 10 reps. Family to hold legs to ground.  1. Cross your arms across chest & sit up tall for 1-2 minutes. 2. Lean forward as far forward as possible then sit up tall. 3.  Sit ups - family holds down legs / knees and pt uses stomach muscles to sit up away from back of w/c then slowly sit back against back. 4. Cross arms across chest. Turn chest to look over shoulders both to right & left.  5. Hang arms to side of w/c. Lean to right reaching towards floor then sit up. Repeat to left.   Sit in front of sink - with feet on floor. Place hands on armrests of w/c, try to push up to "look" in the sink. Goal is to just get bottom off w/c seat.    Wear prostheses 2hrs at time, then off for 1-2hours. Wear black shrinker only when prostheses are off.

## 2019-04-05 NOTE — Therapy (Signed)
[facility] [address] [place], [place], [address] Phone: [phone]   Fax:  [phone]  Physical Therapy Treatment  Patient Details  Name: [name] MRN: [id number] Date of Birth: 05-Sep-1927 Referring Provider (PT): [name], MD   Encounter Date: 04/05/2019  PT End of Session - 04/05/19 1312    Visit Number  2    Number of Visits  50    Date for PT Re-Evaluation  06/30/19    Authorization Type  [employer] Medicare    Authorization Time Period  $30 co-pay    PT Start Time  1052    PT Stop Time  1145    PT Time Calculation (min)  53 min    Equipment Utilized During Treatment  Gait belt    Activity Tolerance  Patient tolerated treatment well    Behavior During Therapy  [facility] for tasks assessed/performed       Past Medical History:  Diagnosis Date  . Arthritis   . Borderline diabetic   . Diabetes mellitus without complication ([place])   . Glaucoma   . Hyperlipidemia   . Hypertension   . PVD (peripheral vascular disease) ([place])   . Sleep apnea    Cpap ordered but doesnt use  . Urinary retention due to benign prostatic hyperplasia 12/28/2018    Past Surgical History:  Procedure Laterality Date  . ABDOMINAL AORTOGRAM W/LOWER EXTREMITY N/A 07/27/2018   Procedure: ABDOMINAL AORTOGRAM W/LOWER EXTREMITY;  Surgeon: Lorretta Harp, MD;  Location: [place] [facility];  Service: Cardiovascular;  Laterality: N/A;  . AMPUTATION Right 08/28/2018   Procedure: RIGHT BELOW KNEE AMPUTATION;  Surgeon: [name], MD;  Location: [place];  Service: Orthopedics;  Laterality: Right;  . AMPUTATION Left 12/02/2018   Procedure: LEFT TRANSMETATARSAL AMPUTATION AND NEGATIVE PRESSURE WOUND VAC PLACEMENT;  Surgeon: [name], MD;  Location: [place];  Service: Orthopedics;  Laterality: Left;  . AMPUTATION Left 12/18/2018   Procedure: LEFT BELOW KNEE AMPUTATION;  Surgeon: [name], MD;  Location: [place];  Service: Orthopedics;  Laterality: Left;  .  APPENDECTOMY  1943  . BELOW KNEE LEG AMPUTATION Left 12/18/2018  . CATARACT EXTRACTION  2012   x2  . COLONOSCOPY N/A 11/01/2014   Procedure: COLONOSCOPY;  Surgeon: Aviva Signs, MD;  [facility];  Service: Gastroenterology;  Laterality: N/A;  . ESOPHAGOGASTRODUODENOSCOPY N/A 11/01/2014   Procedure: ESOPHAGOGASTRODUODENOSCOPY (EGD);  Surgeon: Aviva Signs, MD;  [facility];  Service: Gastroenterology;  Laterality: N/A;  . HERNIA REPAIR Left   . INGUINAL HERNIA REPAIR Right 03/01/2013   Procedure: RIGHT INGUINAL HERNIORRHAPHY;  Surgeon: Jamesetta So, MD;  [facility];  Service: General;  Laterality: Right;  . INSERTION OF MESH Right 03/01/2013   Procedure: INSERTION OF MESH;  Surgeon: Jamesetta So, MD;  [facility];  Service: General;  Laterality: Right;  . LOWER EXTREMITY ANGIOGRAPHY Right 08/25/2018   Procedure: LOWER EXTREMITY ANGIOGRAPHY;  Surgeon: Wellington Hampshire, MD;  Location: [place] [facility];  Service: Cardiovascular;  Laterality: Right;  . LOWER EXTREMITY ANGIOGRAPHY N/A 11/25/2018   Procedure: LOWER EXTREMITY ANGIOGRAPHY;  Surgeon: Wellington Hampshire, MD;  Location: [place] [facility];  Service: Cardiovascular;  Laterality: N/A;  . NM MYOCAR PERF WALL MOTION  06/05/2010   no significant ischemia  . PERIPHERAL VASCULAR ATHERECTOMY Right 08/03/2018   Procedure: PERIPHERAL VASCULAR ATHERECTOMY;  Surgeon: Lorretta Harp, MD;  Location: [place] [facility];  Service: Cardiovascular;  Laterality:  Right;  [name] PERIPHERAL VASCULAR ATHERECTOMY Left 11/23/2018   Procedure: PERIPHERAL VASCULAR ATHERECTOMY;  Surgeon: Lorretta Harp, MD;  Location: [place] [facility];  Service: Cardiovascular;  Laterality: Left;  sfa with dc balloon  . PERIPHERAL VASCULAR BALLOON ANGIOPLASTY Left 11/25/2018   Procedure: PERIPHERAL VASCULAR BALLOON ANGIOPLASTY;  Surgeon: Wellington Hampshire, MD;  Location: [place] [facility];  Service: Cardiovascular;  Laterality: Left;   popliteal  . PERIPHERAL VASCULAR INTERVENTION Left 11/25/2018   Procedure: PERIPHERAL VASCULAR INTERVENTION;  Surgeon: Wellington Hampshire, MD;  Location: [place] [facility];  Service: Cardiovascular;  Laterality: Left;  tibial peroneal trunk and peroneal stents   . stent  06/14/2010   left leg  . STUMP REVISION Left 01/20/2019   Procedure: REVISION LEFT BELOW KNEE AMPUTATION;  Surgeon: [name], MD;  Location: [place];  Service: Orthopedics;  Laterality: Left;  . US ECHOCARDIOGRAPHY  01/21/2006   moderate mitral annular ca+, mild MR, AOV moderately sclerotic.    There were no vitals filed for this visit.  Subjective Assessment - 04/05/19 1053    Subjective  He wore prostheses up to 3hrs multiple times per day. He is up ~12 hours/day.    Patient is accompained by:  Family member   son, Cleatus Gabriel   Pertinent History  Bil TTAs, DM2, PAD, HTN, CHF, A-Fib, TIA, CKDst3    Patient Stated Goals  walk with prostheses in home & in/out house. Son would like him to be able to toilet & get in/out bed by himself.    Currently in Pain?  No/denies         Prosthetic Training:  Patient arrived with prostheses properly donned by son this morning. Son reports wear 1-3 hr periods without skin issues. PT checked bilateral residual limbs with no issues or discolorations.  PT donned while reviewing with son.   PT recommended consistent wear with 2hrs on 1-2 hrs off during his awake hours. Wear black shrinker when prosthesis off.   Sitting at edge of w/c so both heels / feet are touching the ground and back away from back of w/c. 1-2 x/day.  Initially do 5 reps each & build up to 10 reps. Family to hold legs to ground.  1. Cross your arms across chest & sit up tall for 1-2 minutes. 2. Lean forward as far forward as possible then sit up tall. 3.  Sit ups - family holds down legs / knees and pt uses stomach muscles to sit up away from back of w/c then slowly sit back against back. 4. Cross arms across  chest. Turn chest to look over shoulders both to right & left.  5. Hang arms to side of w/c. Lean to right reaching towards floor then sit up. Repeat to left.   Sit in front of sink - with feet on floor. Place hands on armrests of w/c, try to push up to "look" in the sink. Goal is to just get bottom off w/c seat.   PT verbal cues on using STEADY at home to facilitate patient participation. Have patient push of w/c during initial portion then switch hands to grab bar on steady and have patient pull for terminal portion of motion. Lower patient so pelvis ~1-2" lower then have patient stand for last portion without lifting with hoist / vest.  Son verbalized understanding.             [facility] Adult PT Treatment/Exercise - 04/05/19 1050      Transfers  Transfers  --    Sit to Stand  --    Sit to Stand Details  --    Sit to Stand: Patient Percentage  --    Stand to Sit  --    Stand to Sit Details (indicate cue type and reason)  --      Ambulation/Gait   Ambulation/Gait  --    Ambulation/Gait Assistance  --    Ambulation Distance (Feet)  --    Assistive device  --    Gait Pattern  --    Gait Comments  --      Posture/Postural Control   Posture/Postural Control  --    Postural Limitations  --      Prosthetics   Prosthetic Care Comments   wear 2 hrs on, 1-2 hrs off during awake hours    Current prosthetic wear tolerance (days/week)   daily    Current prosthetic wear tolerance (#hours/day)   1-3 hrs during awake hours    Current prosthetic weight-bearing tolerance (hours/day)   --    Edema  pitting edema    Residual limb condition   left limb: medial incision has rough circular area that may internal suture working out & redness at tibial tuberosity and medial hamstring tendon;  bilateral limbs: frail skin, normal moisture & temperature, minimal to no hair growth.     Education Provided  Skin check;Residual limb care;Prosthetic cleaning;Correct ply sock adjustment;Proper  Donning;Proper Doffing;Proper wear schedule/adjustment;Proper weight-bearing schedule/adjustment;Other (comment)   see prosthetic care comments   Person(s) Educated  Patient;Child(ren)    Education Method  Explanation;Demonstration;Tactile cues;Verbal cues    Education Method  Verbalized understanding;Returned demonstration;Tactile cues required;Verbal cues required;Needs further instruction    Donning Prosthesis  Maximum assist    Doffing Prosthesis  Maximum assist             PT Education - 04/05/19 1145    Education Details  Initial HEP trunk exercises in sitting & using STEADY at home to enable patient to work portions of sit/stand.    Person(s) Educated  Patient;Child(ren)    Methods  Explanation;Demonstration;Tactile cues;Verbal cues;Handout    Comprehension  Verbalized understanding;Returned demonstration;Verbal cues required;Tactile cues required;Need further instruction       PT Short Term Goals - 04/01/19 2200      PT SHORT TERM GOAL #1   Title  Patient's son demonstrates proper donning & verbalizes proper cleaning. (All STGs Target Date: 04/30/2019)    Time  4    Period  Weeks    Status  New    Target Date  04/30/19      PT SHORT TERM GOAL #2   Title  Patient tolerates prostheses wear >8 hrs total / day without skin issues.    Time  4    Period  Weeks    Status  New    Target Date  04/30/19      PT SHORT TERM GOAL #3   Title  Sit to / from stand w/c to parallel bars pulling on bars with modA.    Time  4    Period  Weeks    Status  New    Target Date  04/30/19      PT SHORT TERM GOAL #4   Title  Patient tolerates standing with parallel bar support with minA for 3 minutes.    Time  4    Period  Weeks    Status  New    Target Date  04/30/19  PT SHORT TERM GOAL #5   Title  Patient ambulates 10' in parallel bars with modA.    Time  4    Period  Weeks    Status  New    Target Date  04/30/19        PT Long Term Goals - 04/01/19 2207      PT  LONG TERM GOAL #1   Title  Patient and family verbalize and demonstrate understanding of proper prosthetic care to enable safe utilization of prostheses. (All LTGs Target Date: 09/24/2019)    Time  6    Period  Months    Status  New    Target Date  09/24/19      PT LONG TERM GOAL #2   Title  Patient tolerates wear of bilateral prostheses >90% of awake hours without skin issues or limb pain to enable functional potential during his day.    Time  6    Period  Months    Status  New    Target Date  09/24/19      PT LONG TERM GOAL #3   Title  Squat pivot transfer bed to w/c or chair to w/c with prostheses modified independent.    Time  6    Period  Months    Status  New    Target Date  09/24/19      PT LONG TERM GOAL #4   Title  Sit to / from stand elevated w/c to walker with prostheses with supervision.    Time  6    Period  Months    Status  New    Target Date  09/24/19      PT LONG TERM GOAL #5   Title  Standing balance with walker support with bilateral prostheses static stance for 5 minutes, scanning environment and reaches 2" anteriorly with supervision.    Time  6    Period  Months    Status  New    Target Date  09/24/19      Additional Long Term Goals   Additional Long Term Goals  Yes      PT LONG TERM GOAL #6   Title  Patient ambulates 53' between 2 chairs including turning 90* to position to sit with RW & prostheses with minA from family safely.    Time  6    Period  Months    Status  New    Target Date  09/24/19            Plan - 04/05/19 1502    Clinical Impression Statement  PT session focused on educating patient & son in HEP to facilitate core muscles for balance & components of sit to stand.  Pt's son appears to have understanding of HEP. PT progressed prosthesis wear.    Personal Factors and Comorbidities  Age;Comorbidity 3+;Fitness;Time since onset of injury/illness/exacerbation    Comorbidities  Bil TTAs, DM2, PAD, HTN, CHF, A-Fib, TIA, CKDst3,     Examination-Activity Limitations  Locomotion Level;Sit;Stand;Toileting;Transfers    Examination-Participation Restrictions  Community Activity    Stability/Clinical Decision Making  Evolving/Moderate complexity    Rehab Potential  Good    PT Frequency  2x / week    PT Duration  Other (comment)   25 weeks (6 months)   PT Treatment/Interventions  ADLs/Self Care Home Management;DME Instruction;Gait training;Stair training;Functional mobility training;Therapeutic activities;Therapeutic exercise;Balance training;Neuromuscular re-education;Patient/family education;Prosthetic Training;Manual techniques;Passive range of motion;Vestibular    PT Next Visit Plan  review prosthetic care, check  HEP and add flexibility with family assist, sit to/from stand & standing in parallel bars    Consulted and Agree with Plan of Care  Patient;Family member/caregiver    Family Member Consulted  son, Renold Kozar       Patient will benefit from skilled therapeutic intervention in order to improve the following deficits and impairments:  Abnormal gait, Cardiopulmonary status limiting activity, Decreased activity tolerance, Decreased balance, Decreased endurance, Decreased knowledge of use of DME, Decreased mobility, Decreased range of motion, Decreased scar mobility, Decreased strength, Increased edema, Impaired flexibility, Impaired UE functional use, Postural dysfunction, Prosthetic Dependency, Pain  Visit Diagnosis: Unsteadiness on feet  Other abnormalities of gait and mobility  Abnormal posture  History of fall  Contracture of muscle, multiple sites  Stiffness of right knee, not elsewhere classified  Stiffness of left knee, not elsewhere classified  Generalized edema  Muscle weakness (generalized)     Problem List Patient Active Problem List   Diagnosis Date Noted  . Weakness generalized 04/05/2019  . S/P BKA (below knee amputation) unilateral, left ([place]) 02/15/2019  . Pressure injury of skin  02/03/2019  . TIA (transient ischemic attack) 02/03/2019  . Transient speech disturbance 02/02/2019  . Chronic indwelling Foley catheter 01/22/2019  . Postoperative wound infection 01/20/2019  . Normocytic anemia 01/18/2019  . Cellulitis 01/18/2019  . Infection of deep incisional surgical site after procedure   . Abnormal urinalysis   . Labile blood glucose   . Urinary retention   . S/P bilateral BKA (below knee amputation) ([place])   . Acute blood loss anemia   . Urinary retention due to benign prostatic hyperplasia 12/28/2018  . Unilateral complete BKA, left, initial encounter ([place]) 12/25/2018  . Dehiscence of amputation stump ([place])   . History of transmetatarsal amputation of left foot ([place]) 12/09/2018  . Critical limb ischemia with history of revascularization of same extremity   . Gangrene of left foot ([place])   . Diabetic ulcer of toe of left foot associated with type 2 diabetes mellitus ([place])   . Cellulitis of left lower extremity   . Toe infection 11/18/2018  . Peripheral edema   . Acute on chronic diastolic CHF (congestive heart failure) ([place]) 11/09/2018  . S/P BKA (below knee amputation) unilateral, right ([place]) 10/05/2018  . Hyponatremia   . Essential hypertension   . Postoperative pain   . Pain of left heel   . Unable to maintain weight-bearing   . Type 2 diabetes mellitus with diabetic peripheral angiopathy with gangrene ([place])   . Anemia due to acute blood loss   . Chronic kidney disease, stage 3a   . Acquired absence of right leg below knee ([place]) 09/07/2018  . Gangrene of right foot ([place])   . DNR (do not resuscitate) discussion   . Goals of care, counseling/discussion   . Palliative care by specialist   . Type 2 diabetes mellitus with vascular disease ([place])   . Severe protein-calorie malnutrition ([place])   . Ischemic foot 08/20/2018  . Critical lower limb ischemia 08/03/2018  . Type 2 diabetes mellitus with foot ulcer, without long-term current use of insulin ([place])    . Gastroesophageal reflux disease   . Cellulitis of great toe of right foot 05/29/2018  . Atrial fibrillation, chronic   . Chest pain 07/25/2017  . PAF (paroxysmal atrial fibrillation) ([place]) 07/25/2017  . BPH (benign prostatic hyperplasia) 07/25/2017  . Shortness of breath 05/11/2017  . Hypothyroidism 05/11/2017  . Chronic diastolic CHF (congestive heart failure) ([place]) 05/11/2017  .  Moderate aortic stenosis 06/12/2016  . RBBB 06/12/2016  . Peripheral vascular disease ([place]) 08/04/2012  . Essential hypertension, benign 08/04/2012  . Hyperlipidemia 08/04/2012  . Dizziness 08/04/2012    Jamey Reas  PT, DPT 04/05/2019, 3:16 PM  [facility] [address] [place], [place], [address] Phone: [phone]   Fax:  [phone]  Name: NOX TALENT MRN: [id number] Date of Birth: [birth year]

## 2019-04-07 ENCOUNTER — Other Ambulatory Visit: Payer: Self-pay

## 2019-04-07 ENCOUNTER — Ambulatory Visit: Payer: Medicare Other | Admitting: Physical Therapy

## 2019-04-07 DIAGNOSIS — M25661 Stiffness of right knee, not elsewhere classified: Secondary | ICD-10-CM

## 2019-04-07 DIAGNOSIS — Z9181 History of falling: Secondary | ICD-10-CM | POA: Diagnosis not present

## 2019-04-07 DIAGNOSIS — M6249 Contracture of muscle, multiple sites: Secondary | ICD-10-CM

## 2019-04-07 DIAGNOSIS — R2681 Unsteadiness on feet: Secondary | ICD-10-CM | POA: Diagnosis not present

## 2019-04-07 DIAGNOSIS — R2689 Other abnormalities of gait and mobility: Secondary | ICD-10-CM | POA: Diagnosis not present

## 2019-04-07 DIAGNOSIS — M25662 Stiffness of left knee, not elsewhere classified: Secondary | ICD-10-CM

## 2019-04-07 DIAGNOSIS — R293 Abnormal posture: Secondary | ICD-10-CM

## 2019-04-07 DIAGNOSIS — M6281 Muscle weakness (generalized): Secondary | ICD-10-CM

## 2019-04-07 DIAGNOSIS — R601 Generalized edema: Secondary | ICD-10-CM

## 2019-04-07 NOTE — Therapy (Signed)
[facility] Physical Therapy [address] [place], [place], [address] Phone: [phone]   Fax:  [phone]  Physical Therapy Treatment  Patient Details  Name: [name] MRN: [id number] Date of Birth: 04/22/1927 Referring Provider (PT): [name], MD   Encounter Date: 04/07/2019  PT End of Session - 04/07/19 1500    Visit Number  3    Number of Visits  50    Date for PT Re-Evaluation  06/30/19    Authorization Type  [employer] Medicare    Authorization Time Period  $30 co-pay    PT Start Time  1315    PT Stop Time  1400    PT Time Calculation (min)  45 min    Equipment Utilized During Treatment  Gait belt    Activity Tolerance  Patient tolerated treatment well    Behavior During Therapy  [facility] for tasks assessed/performed       Past Medical History:  Diagnosis Date  . Arthritis   . Borderline diabetic   . Diabetes mellitus without complication ([place])   . Glaucoma   . Hyperlipidemia   . Hypertension   . PVD (peripheral vascular disease) ([place])   . Sleep apnea    Cpap ordered but doesnt use  . Urinary retention due to benign prostatic hyperplasia 12/28/2018    Past Surgical History:  Procedure Laterality Date  . ABDOMINAL AORTOGRAM W/LOWER EXTREMITY N/A 07/27/2018   Procedure: ABDOMINAL AORTOGRAM W/LOWER EXTREMITY;  Surgeon: Lorretta Harp, MD;  Location: [place] [facility];  Service: Cardiovascular;  Laterality: N/A;  . AMPUTATION Right 08/28/2018   Procedure: RIGHT BELOW KNEE AMPUTATION;  Surgeon: [name], MD;  Location: [place];  Service: Orthopedics;  Laterality: Right;  . AMPUTATION Left 12/02/2018   Procedure: LEFT TRANSMETATARSAL AMPUTATION AND NEGATIVE PRESSURE WOUND VAC PLACEMENT;  Surgeon: [name], MD;  Location: [place];  Service: Orthopedics;  Laterality: Left;  . AMPUTATION Left 12/18/2018   Procedure: LEFT BELOW KNEE AMPUTATION;  Surgeon: [name], MD;  Location: [place];  Service: Orthopedics;  Laterality: Left;  .  APPENDECTOMY  1943  . BELOW KNEE LEG AMPUTATION Left 12/18/2018  . CATARACT EXTRACTION  2012   x2  . COLONOSCOPY N/A 11/01/2014   Procedure: COLONOSCOPY;  Surgeon: Aviva Signs, MD;  [facility];  Service: Gastroenterology;  Laterality: N/A;  . ESOPHAGOGASTRODUODENOSCOPY N/A 11/01/2014   Procedure: ESOPHAGOGASTRODUODENOSCOPY (EGD);  Surgeon: Aviva Signs, MD;  [facility];  Service: Gastroenterology;  Laterality: N/A;  . HERNIA REPAIR Left   . INGUINAL HERNIA REPAIR Right 03/01/2013   Procedure: RIGHT INGUINAL HERNIORRHAPHY;  Surgeon: Jamesetta So, MD;  Location: [facility];  Service: General;  Laterality: Right;  . INSERTION OF MESH Right 03/01/2013   Procedure: INSERTION OF MESH;  Surgeon: Jamesetta So, MD;  Location: [facility];  Service: General;  Laterality: Right;  . LOWER EXTREMITY ANGIOGRAPHY Right 08/25/2018   Procedure: LOWER EXTREMITY ANGIOGRAPHY;  Surgeon: Wellington Hampshire, MD;  Location: [place] [facility];  Service: Cardiovascular;  Laterality: Right;  . LOWER EXTREMITY ANGIOGRAPHY N/A 11/25/2018   Procedure: LOWER EXTREMITY ANGIOGRAPHY;  Surgeon: Wellington Hampshire, MD;  Location: [place] [facility];  Service: Cardiovascular;  Laterality: N/A;  . NM MYOCAR PERF WALL MOTION  06/05/2010   no significant ischemia  . PERIPHERAL VASCULAR ATHERECTOMY Right 08/03/2018   Procedure: PERIPHERAL VASCULAR ATHERECTOMY;  Surgeon: Lorretta Harp, MD;  Location: [place] [facility];  Service: Cardiovascular;  Laterality:  Right;  [name] PERIPHERAL VASCULAR ATHERECTOMY Left 11/23/2018   Procedure: PERIPHERAL VASCULAR ATHERECTOMY;  Surgeon: Lorretta Harp, MD;  Location: [place] [facility];  Service: Cardiovascular;  Laterality: Left;  sfa with dc balloon  . PERIPHERAL VASCULAR BALLOON ANGIOPLASTY Left 11/25/2018   Procedure: PERIPHERAL VASCULAR BALLOON ANGIOPLASTY;  Surgeon: Wellington Hampshire, MD;  Location: [place] [facility];  Service: Cardiovascular;  Laterality: Left;   popliteal  . PERIPHERAL VASCULAR INTERVENTION Left 11/25/2018   Procedure: PERIPHERAL VASCULAR INTERVENTION;  Surgeon: Wellington Hampshire, MD;  Location: [place] [facility];  Service: Cardiovascular;  Laterality: Left;  tibial peroneal trunk and peroneal stents   . stent  06/14/2010   left leg  . STUMP REVISION Left 01/20/2019   Procedure: REVISION LEFT BELOW KNEE AMPUTATION;  Surgeon: [name], MD;  Location: [place];  Service: Orthopedics;  Laterality: Left;  . US ECHOCARDIOGRAPHY  01/21/2006   moderate mitral annular ca+, mild MR, AOV moderately sclerotic.    There were no vitals filed for this visit.  Subjective Assessment - 04/07/19 1405    Subjective  relays no complaints, no skin issues to report    Patient is accompained by:  Family member   son, Christina Waldrop   Pertinent History  Bil TTAs, DM2, PAD, HTN, CHF, A-Fib, TIA, CKDst3    Patient Stated Goals  walk with prostheses in home & in/out house. Son would like him to be able to toilet & get in/out bed by himself.                       [facility] Adult PT Treatment/Exercise - 04/07/19 0001      Transfers   Transfers  Sit to Stand;Stand to Sit    Sit to Stand  2: Max assist;With upper extremity assist;From chair/3-in-1    Sit to Stand Details  Manual facilitation for weight shifting;Verbal cues for safe use of DME/AE;Verbal cues for technique    Sit to Stand: Patient Percentage  20%    Stand to Sit  3: Mod assist;With upper extremity assist;To chair/3-in-1    Stand to Sit Details (indicate cue type and reason)  Verbal cues for technique;Manual facilitation for weight shifting    Comments  stood in bars 2 min X 3 reps, stood at counter top about 10 seconds      Exercises   Exercises  Other Exercises    Other Exercises   10 reps bilat all exercises seated:  marches, LAQ, hip add ball sq 5 sec holds, hamstring curls with red band, and clams with red band.       Manual Therapy   Manual therapy comments  manual  stretching for hamstrings bilat 30 sec X 3 ea side               PT Short Term Goals - 04/01/19 2200      PT SHORT TERM GOAL #1   Title  Patient's son demonstrates proper donning & verbalizes proper cleaning. (All STGs Target Date: 04/30/2019)    Time  4    Period  Weeks    Status  New    Target Date  04/30/19      PT SHORT TERM GOAL #2   Title  Patient tolerates prostheses wear >8 hrs total / day without skin issues.    Time  4    Period  Weeks    Status  New    Target Date  04/30/19  PT SHORT TERM GOAL #3   Title  Sit to / from stand w/c to parallel bars pulling on bars with modA.    Time  4    Period  Weeks    Status  New    Target Date  04/30/19      PT SHORT TERM GOAL #4   Title  Patient tolerates standing with parallel bar support with minA for 3 minutes.    Time  4    Period  Weeks    Status  New    Target Date  04/30/19      PT SHORT TERM GOAL #5   Title  Patient ambulates 10' in parallel bars with modA.    Time  4    Period  Weeks    Status  New    Target Date  04/30/19        PT Long Term Goals - 04/01/19 2207      PT LONG TERM GOAL #1   Title  Patient and family verbalize and demonstrate understanding of proper prosthetic care to enable safe utilization of prostheses. (All LTGs Target Date: 09/24/2019)    Time  6    Period  Months    Status  New    Target Date  09/24/19      PT LONG TERM GOAL #2   Title  Patient tolerates wear of bilateral prostheses >90% of awake hours without skin issues or limb pain to enable functional potential during his day.    Time  6    Period  Months    Status  New    Target Date  09/24/19      PT LONG TERM GOAL #3   Title  Squat pivot transfer bed to w/c or chair to w/c with prostheses modified independent.    Time  6    Period  Months    Status  New    Target Date  09/24/19      PT LONG TERM GOAL #4   Title  Sit to / from stand elevated w/c to walker with prostheses with supervision.    Time  6     Period  Months    Status  New    Target Date  09/24/19      PT LONG TERM GOAL #5   Title  Standing balance with walker support with bilateral prostheses static stance for 5 minutes, scanning environment and reaches 2" anteriorly with supervision.    Time  6    Period  Months    Status  New    Target Date  09/24/19      Additional Long Term Goals   Additional Long Term Goals  Yes      PT LONG TERM GOAL #6   Title  Patient ambulates 58' between 2 chairs including turning 90* to position to sit with RW & prostheses with minA from family safely.    Time  6    Period  Months    Status  New    Target Date  09/24/19            Plan - 04/07/19 1504    Clinical Impression Statement  Session focused on seated hip/knee strength bilat with increasing standing tolerance. He was able to fully stand at the sink today with UE support after max A from PT. He still needs balance and hip/knee strength and PT in order to progress his standing/gait.    Personal Factors and Comorbidities  Age;Comorbidity 3+;Fitness;Time since onset of injury/illness/exacerbation    Comorbidities  Bil TTAs, DM2, PAD, HTN, CHF, A-Fib, TIA, CKDst3,    Examination-Activity Limitations  Locomotion Level;Sit;Stand;Toileting;Transfers    Examination-Participation Restrictions  Community Activity    Stability/Clinical Decision Making  Evolving/Moderate complexity    Rehab Potential  Good    PT Frequency  2x / week    PT Duration  Other (comment)   25 weeks (6 months)   PT Treatment/Interventions  ADLs/Self Care Home Management;DME Instruction;Gait training;Stair training;Functional mobility training;Therapeutic activities;Therapeutic exercise;Balance training;Neuromuscular re-education;Patient/family education;Prosthetic Training;Manual techniques;Passive range of motion;Vestibular    PT Next Visit Plan  review prosthetic care, check HEP and add flexibility with family assist, sit to/from stand & standing in parallel bars     Consulted and Agree with Plan of Care  Patient;Family member/caregiver    Family Member Consulted  son, Larnce Schnackenberg       Patient will benefit from skilled therapeutic intervention in order to improve the following deficits and impairments:  Abnormal gait, Cardiopulmonary status limiting activity, Decreased activity tolerance, Decreased balance, Decreased endurance, Decreased knowledge of use of DME, Decreased mobility, Decreased range of motion, Decreased scar mobility, Decreased strength, Increased edema, Impaired flexibility, Impaired UE functional use, Postural dysfunction, Prosthetic Dependency, Pain  Visit Diagnosis: Unsteadiness on feet  Other abnormalities of gait and mobility  Abnormal posture  History of fall  Contracture of muscle, multiple sites  Stiffness of right knee, not elsewhere classified  Stiffness of left knee, not elsewhere classified  Generalized edema  Muscle weakness (generalized)     Problem List Patient Active Problem List   Diagnosis Date Noted  . Weakness generalized 04/05/2019  . S/P BKA (below knee amputation) unilateral, left ([place]) 02/15/2019  . Pressure injury of skin 02/03/2019  . TIA (transient ischemic attack) 02/03/2019  . Transient speech disturbance 02/02/2019  . Chronic indwelling Foley catheter 01/22/2019  . Postoperative wound infection 01/20/2019  . Normocytic anemia 01/18/2019  . Cellulitis 01/18/2019  . Infection of deep incisional surgical site after procedure   . Abnormal urinalysis   . Labile blood glucose   . Urinary retention   . S/P bilateral BKA (below knee amputation) ([place])   . Acute blood loss anemia   . Urinary retention due to benign prostatic hyperplasia 12/28/2018  . Unilateral complete BKA, left, initial encounter ([place]) 12/25/2018  . Dehiscence of amputation stump ([place])   . History of transmetatarsal amputation of left foot ([place]) 12/09/2018  . Critical limb ischemia with history of revascularization of  same extremity   . Gangrene of left foot ([place])   . Diabetic ulcer of toe of left foot associated with type 2 diabetes mellitus ([place])   . Cellulitis of left lower extremity   . Toe infection 11/18/2018  . Peripheral edema   . Acute on chronic diastolic CHF (congestive heart failure) ([place]) 11/09/2018  . S/P BKA (below knee amputation) unilateral, right ([place]) 10/05/2018  . Hyponatremia   . Essential hypertension   . Postoperative pain   . Pain of left heel   . Unable to maintain weight-bearing   . Type 2 diabetes mellitus with diabetic peripheral angiopathy with gangrene ([place])   . Anemia due to acute blood loss   . Chronic kidney disease, stage 3a   . Acquired absence of right leg below knee ([place]) 09/07/2018  . Gangrene of right foot ([place])   . DNR (do not resuscitate) discussion   . Goals of care, counseling/discussion   . Palliative care by specialist   .  Type 2 diabetes mellitus with vascular disease ([place])   . Severe protein-calorie malnutrition ([place])   . Ischemic foot 08/20/2018  . Critical lower limb ischemia 08/03/2018  . Type 2 diabetes mellitus with foot ulcer, without long-term current use of insulin ([place])   . Gastroesophageal reflux disease   . Cellulitis of great toe of right foot 05/29/2018  . Atrial fibrillation, chronic   . Chest pain 07/25/2017  . PAF (paroxysmal atrial fibrillation) ([place]) 07/25/2017  . BPH (benign prostatic hyperplasia) 07/25/2017  . Shortness of breath 05/11/2017  . Hypothyroidism 05/11/2017  . Chronic diastolic CHF (congestive heart failure) ([place]) 05/11/2017  . Moderate aortic stenosis 06/12/2016  . RBBB 06/12/2016  . Peripheral vascular disease ([place]) 08/04/2012  . Essential hypertension, benign 08/04/2012  . Hyperlipidemia 08/04/2012  . Dizziness 08/04/2012    Debbe Odea, PT,DPT 04/07/2019, 3:07 PM  [facility] Physical Therapy [address] [place], [place], [address] Phone: [phone]   Fax:   [phone]  Name: [name] MRN: [id number] Date of Birth: 02-02-1927

## 2019-04-13 ENCOUNTER — Ambulatory Visit: Payer: Medicare Other | Admitting: Physical Therapy

## 2019-04-13 ENCOUNTER — Encounter: Payer: Self-pay | Admitting: Physical Therapy

## 2019-04-13 ENCOUNTER — Other Ambulatory Visit: Payer: Self-pay

## 2019-04-13 DIAGNOSIS — M6249 Contracture of muscle, multiple sites: Secondary | ICD-10-CM

## 2019-04-13 DIAGNOSIS — M6281 Muscle weakness (generalized): Secondary | ICD-10-CM

## 2019-04-13 DIAGNOSIS — R2689 Other abnormalities of gait and mobility: Secondary | ICD-10-CM

## 2019-04-13 DIAGNOSIS — R2681 Unsteadiness on feet: Secondary | ICD-10-CM

## 2019-04-13 DIAGNOSIS — R293 Abnormal posture: Secondary | ICD-10-CM

## 2019-04-13 DIAGNOSIS — R531 Weakness: Secondary | ICD-10-CM | POA: Diagnosis not present

## 2019-04-13 DIAGNOSIS — M25662 Stiffness of left knee, not elsewhere classified: Secondary | ICD-10-CM

## 2019-04-13 DIAGNOSIS — R601 Generalized edema: Secondary | ICD-10-CM

## 2019-04-13 NOTE — Patient Instructions (Signed)
Access Code: [email or web address] URL: [email or web address] Date: 04/13/2019 Prepared by: [facility]  Exercises Supine Bridge - 1-2 x daily - 7 x weekly - 2 sets - 5 reps - 5 seconds hold Supine Heel Slide - 1-2 x daily - 7 x weekly - 2 sets - 5 reps - 5 seconds hold Modified Thomas Stretch - 1-2 x daily - 7 x weekly - 1 sets - 3 reps - 30 seconds hold

## 2019-04-13 NOTE — Therapy (Signed)
[facility] Therapy [address] [place], [place], [address] Phone: [phone]   Fax:  [phone]  Physical Therapy Treatment  Patient Details  Name: [name] MRN: [id number] Date of Birth: 03-11-1927 Referring Provider (PT): [name], MD   Encounter Date: 04/13/2019  PT End of Session - 04/13/19 1449    Visit Number  4    Number of Visits  50    Date for PT Re-Evaluation  06/30/19    Authorization Type  [employer] Medicare    Authorization Time Period  $30 co-pay    PT Start Time  1350    PT Stop Time  1445    PT Time Calculation (min)  55 min    Equipment Utilized During Treatment  Gait belt    Activity Tolerance  Patient tolerated treatment well    Behavior During Therapy  [facility] for tasks assessed/performed       Past Medical History:  Diagnosis Date  . Arthritis   . Borderline diabetic   . Diabetes mellitus without complication ([place])   . Glaucoma   . Hyperlipidemia   . Hypertension   . PVD (peripheral vascular disease) ([place])   . Sleep apnea    Cpap ordered but doesnt use  . Urinary retention due to benign prostatic hyperplasia 12/28/2018    Past Surgical History:  Procedure Laterality Date  . ABDOMINAL AORTOGRAM W/LOWER EXTREMITY N/A 07/27/2018   Procedure: ABDOMINAL AORTOGRAM W/LOWER EXTREMITY;  Surgeon: Lorretta Harp, MD;  Location: [place] [facility];  Service: Cardiovascular;  Laterality: N/A;  . AMPUTATION Right 08/28/2018   Procedure: RIGHT BELOW KNEE AMPUTATION;  Surgeon: [name], MD;  Location: [place];  Service: Orthopedics;  Laterality: Right;  . AMPUTATION Left 12/02/2018   Procedure: LEFT TRANSMETATARSAL AMPUTATION AND NEGATIVE PRESSURE WOUND VAC PLACEMENT;  Surgeon: [name], MD;  Location: [place];  Service: Orthopedics;  Laterality: Left;  . AMPUTATION Left 12/18/2018   Procedure: LEFT BELOW KNEE AMPUTATION;  Surgeon: [name], MD;  Location: [place];  Service: Orthopedics;  Laterality: Left;  .  APPENDECTOMY  1943  . BELOW KNEE LEG AMPUTATION Left 12/18/2018  . CATARACT EXTRACTION  2012   x2  . COLONOSCOPY N/A 11/01/2014   Procedure: COLONOSCOPY;  Surgeon: Aviva Signs, MD;  [facility];  Service: Gastroenterology;  Laterality: N/A;  . ESOPHAGOGASTRODUODENOSCOPY N/A 11/01/2014   Procedure: ESOPHAGOGASTRODUODENOSCOPY (EGD);  Surgeon: Aviva Signs, MD;  [facility];  Service: Gastroenterology;  Laterality: N/A;  . HERNIA REPAIR Left   . INGUINAL HERNIA REPAIR Right 03/01/2013   Procedure: RIGHT INGUINAL HERNIORRHAPHY;  Surgeon: Jamesetta So, MD;  Location: [facility];  Service: General;  Laterality: Right;  . INSERTION OF MESH Right 03/01/2013   Procedure: INSERTION OF MESH;  Surgeon: Jamesetta So, MD;  Location: [facility];  Service: General;  Laterality: Right;  . LOWER EXTREMITY ANGIOGRAPHY Right 08/25/2018   Procedure: LOWER EXTREMITY ANGIOGRAPHY;  Surgeon: Wellington Hampshire, MD;  Location: [place] [facility];  Service: Cardiovascular;  Laterality: Right;  . LOWER EXTREMITY ANGIOGRAPHY N/A 11/25/2018   Procedure: LOWER EXTREMITY ANGIOGRAPHY;  Surgeon: Wellington Hampshire, MD;  Location: [place] [facility];  Service: Cardiovascular;  Laterality: N/A;  . NM MYOCAR PERF WALL MOTION  06/05/2010   no significant ischemia  . PERIPHERAL VASCULAR ATHERECTOMY Right 08/03/2018   Procedure: PERIPHERAL VASCULAR ATHERECTOMY;  Surgeon: Lorretta Harp, MD;  Location: [place] [facility];  Service: Cardiovascular;  Laterality:  Right;  [name] PERIPHERAL VASCULAR ATHERECTOMY Left 11/23/2018   Procedure: PERIPHERAL VASCULAR ATHERECTOMY;  Surgeon: Lorretta Harp, MD;  Location: [place] [facility];  Service: Cardiovascular;  Laterality: Left;  sfa with dc balloon  . PERIPHERAL VASCULAR BALLOON ANGIOPLASTY Left 11/25/2018   Procedure: PERIPHERAL VASCULAR BALLOON ANGIOPLASTY;  Surgeon: Wellington Hampshire, MD;  Location: [place] [facility];  Service: Cardiovascular;  Laterality: Left;   popliteal  . PERIPHERAL VASCULAR INTERVENTION Left 11/25/2018   Procedure: PERIPHERAL VASCULAR INTERVENTION;  Surgeon: Wellington Hampshire, MD;  Location: [place] [facility];  Service: Cardiovascular;  Laterality: Left;  tibial peroneal trunk and peroneal stents   . stent  06/14/2010   left leg  . STUMP REVISION Left 01/20/2019   Procedure: REVISION LEFT BELOW KNEE AMPUTATION;  Surgeon: [name], MD;  Location: [place];  Service: Orthopedics;  Laterality: Left;  . US ECHOCARDIOGRAPHY  01/21/2006   moderate mitral annular ca+, mild MR, AOV moderately sclerotic.    There were no vitals filed for this visit.  Subjective Assessment - 04/13/19 1351    Subjective  He has worn prostheses daily for ~3 hrs 1-2x/day without issues. He is starting to pull himself up when in STEADY.    Patient is accompained by:  Family member   son, [name]   Pertinent History  Bil TTAs, DM2, PAD, HTN, CHF, A-Fib, TIA, CKDst3    Patient Stated Goals  walk with prostheses in home & in/out house. Son would like him to be able to toilet & get in/out bed by himself.    Currently in Pain?  No/denies                       [facility] Adult PT Treatment/Exercise - 04/13/19 1451      Transfers   Transfers  Sit to Stand;Stand to Sit;Squat Pivot Transfers    Sit to Stand  3: Mod assist;With upper extremity assist;From chair/3-in-1;Other (comment)   knees blocked by belt & pt pulling on bars   Sit to Stand Details  Manual facilitation for weight shifting;Verbal cues for safe use of DME/AE;Verbal cues for technique    Sit to Stand Details (indicate cue type and reason)  PT manual & verbal cues with gait belt to facilitate wt shift with patient pulling on //bars with knees blocked by belt    Sit to Stand: Patient Percentage  30%    Stand to Sit  3: Mod assist;With upper extremity assist;To chair/3-in-1    Stand to Sit Details (indicate cue type and reason)  Verbal cues for technique;Manual facilitation for  weight shifting    Squat Pivot Transfers  2: Max assist   w/c to/from Nustep with seats adjacent   Squat Pivot Transfer Details (indicate cue type and reason)  verbal & manual cues on wt shift forward over feet    Comments  --      Neuro Re-ed    Neuro Re-ed Details   standing at //bars with knees blocked by belt anteriorly, PT manual cues for pelvis over feet, verbal cues to look forward / not at floor to faciltate upper body upright posture.  Pt stood for 2 minutes 2 reps with modA once positioned.       Exercises   Exercises  --    Other Exercises   --      Knee/Hip Exercises: Aerobic   Nustep  Level 3 BUEs & BLEs 2 min 2 sets with tactile cues  for full ROM.       Manual Therapy   Manual therapy comments  --      Prosthetics   Current prosthetic wear tolerance (days/week)   daily    Current prosthetic wear tolerance (#hours/day)   2 hrs on, 2 hrs off, rotating awake hours    Current prosthetic weight-bearing tolerance (hours/day)   2 minutes standing without pain in limbs.     Edema  pitting edema    Residual limb condition   left limb: medial incision has rough circular area that may internal suture working out & redness at tibial tuberosity and medial hamstring tendon;  bilateral limbs: frail skin, normal moisture & temperature, minimal to no hair growth.     Education Provided  Skin check;Residual limb care;Prosthetic cleaning;Correct ply sock adjustment;Proper Donning;Proper wear schedule/adjustment;Other (comment)   see prosthetic care comments   Person(s) Educated  Patient;Child(ren)    Education Method  Explanation;Demonstration;Tactile cues;Verbal cues    Education Method  Verbalized understanding;Verbal cues required;Needs further instruction    Donning Prosthesis  Maximum assist    Doffing Prosthesis  Moderate assist             PT Education - 04/13/19 1445    Education Details  HEP Medbridge Access [id number]    Person(s) Educated  Patient;Child(ren)    Methods   Explanation;Demonstration;Tactile cues;Verbal cues;Handout    Comprehension  Verbalized understanding;Need further instruction       PT Short Term Goals - 04/01/19 2200      PT SHORT TERM GOAL #1   Title  Patient's son demonstrates proper donning & verbalizes proper cleaning. (All STGs Target Date: 04/30/2019)    Time  4    Period  Weeks    Status  New    Target Date  04/30/19      PT SHORT TERM GOAL #2   Title  Patient tolerates prostheses wear >8 hrs total / day without skin issues.    Time  4    Period  Weeks    Status  New    Target Date  04/30/19      PT SHORT TERM GOAL #3   Title  Sit to / from stand w/c to parallel bars pulling on bars with modA.    Time  4    Period  Weeks    Status  New    Target Date  04/30/19      PT SHORT TERM GOAL #4   Title  Patient tolerates standing with parallel bar support with minA for 3 minutes.    Time  4    Period  Weeks    Status  New    Target Date  04/30/19      PT SHORT TERM GOAL #5   Title  Patient ambulates 10' in parallel bars with modA.    Time  4    Period  Weeks    Status  New    Target Date  04/30/19        PT Long Term Goals - 04/01/19 2207      PT LONG TERM GOAL #1   Title  Patient and family verbalize and demonstrate understanding of proper prosthetic care to enable safe utilization of prostheses. (All LTGs Target Date: 09/24/2019)    Time  6    Period  Months    Status  New    Target Date  09/24/19      PT LONG TERM GOAL #2   Title  Patient tolerates wear of bilateral prostheses >90% of awake hours without skin issues or limb pain to enable functional potential during his day.    Time  6    Period  Months    Status  New    Target Date  09/24/19      PT LONG TERM GOAL #3   Title  Squat pivot transfer bed to w/c or chair to w/c with prostheses modified independent.    Time  6    Period  Months    Status  New    Target Date  09/24/19      PT LONG TERM GOAL #4   Title  Sit to / from stand elevated  w/c to walker with prostheses with supervision.    Time  6    Period  Months    Status  New    Target Date  09/24/19      PT LONG TERM GOAL #5   Title  Standing balance with walker support with bilateral prostheses static stance for 5 minutes, scanning environment and reaches 2" anteriorly with supervision.    Time  6    Period  Months    Status  New    Target Date  09/24/19      Additional Long Term Goals   Additional Long Term Goals  Yes      PT LONG TERM GOAL #6   Title  Patient ambulates 24' between 2 chairs including turning 90* to position to sit with RW & prostheses with minA from family safely.    Time  6    Period  Months    Status  New    Target Date  09/24/19            Plan - 04/13/19 2237    Clinical Impression Statement  PT session worked on improving strength with NuStep & updated HEP (bridging, heel press hip/knee ext & Modified Thomas Hip flexor stretch)  PT worked on [place] transfers with weight shift over feet.    Personal Factors and Comorbidities  Age;Comorbidity 3+;Fitness;Time since onset of injury/illness/exacerbation    Comorbidities  Bil TTAs, DM2, PAD, HTN, CHF, A-Fib, TIA, CKDst3,    Examination-Activity Limitations  Locomotion Level;Sit;Stand;Toileting;Transfers    Examination-Participation Restrictions  Community Activity    Stability/Clinical Decision Making  Evolving/Moderate complexity    Rehab Potential  Good    PT Frequency  2x / week    PT Duration  Other (comment)   25 weeks (6 months)   PT Treatment/Interventions  ADLs/Self Care Home Management;DME Instruction;Gait training;Stair training;Functional mobility training;Therapeutic activities;Therapeutic exercise;Balance training;Neuromuscular re-education;Patient/family education;Prosthetic Training;Manual techniques;Passive range of motion;Vestibular    PT Next Visit Plan  review prosthetic care, update HEP, sit to/from stand & standing in parallel bars    Consulted and  Agree with Plan of Care  Patient;Family member/caregiver    Family Member Consulted  son, Aceton Kinnear       Patient will benefit from skilled therapeutic intervention in order to improve the following deficits and impairments:  Abnormal gait, Cardiopulmonary status limiting activity, Decreased activity tolerance, Decreased balance, Decreased endurance, Decreased knowledge of use of DME, Decreased mobility, Decreased range of motion, Decreased scar mobility, Decreased strength, Increased edema, Impaired flexibility, Impaired UE functional use, Postural dysfunction, Prosthetic Dependency, Pain  Visit Diagnosis: Other abnormalities of gait and mobility  Unsteadiness on feet  Abnormal posture  Weakness generalized  Contracture of muscle, multiple sites  Stiffness of left knee, not elsewhere classified  Generalized edema  Muscle weakness (generalized)     Problem List Patient Active Problem List   Diagnosis Date Noted  . Weakness generalized 04/05/2019  . S/P BKA (below knee amputation) unilateral, left ([place]) 02/15/2019  . Pressure injury of skin 02/03/2019  . TIA (transient ischemic attack) 02/03/2019  . Transient speech disturbance 02/02/2019  . Chronic indwelling Foley catheter 01/22/2019  . Postoperative wound infection 01/20/2019  . Normocytic anemia 01/18/2019  . Cellulitis 01/18/2019  . Infection of deep incisional surgical site after procedure   . Abnormal urinalysis   . Labile blood glucose   . Urinary retention   . S/P bilateral BKA (below knee amputation) ([place])   . Acute blood loss anemia   . Urinary retention due to benign prostatic hyperplasia 12/28/2018  . Unilateral complete BKA, left, initial encounter ([place]) 12/25/2018  . Dehiscence of amputation stump ([place])   . History of transmetatarsal amputation of left foot ([place]) 12/09/2018  . Critical limb ischemia with history of revascularization of same extremity   . Gangrene of left foot ([place])   . Diabetic ulcer  of toe of left foot associated with type 2 diabetes mellitus ([place])   . Cellulitis of left lower extremity   . Toe infection 11/18/2018  . Peripheral edema   . Acute on chronic diastolic CHF (congestive heart failure) ([place]) 11/09/2018  . S/P BKA (below knee amputation) unilateral, right ([place]) 10/05/2018  . Hyponatremia   . Essential hypertension   . Postoperative pain   . Pain of left heel   . Unable to maintain weight-bearing   . Type 2 diabetes mellitus with diabetic peripheral angiopathy with gangrene ([place])   . Anemia due to acute blood loss   . Chronic kidney disease, stage 3a   . Acquired absence of right leg below knee ([place]) 09/07/2018  . Gangrene of right foot ([place])   . DNR (do not resuscitate) discussion   . Goals of care, counseling/discussion   . Palliative care by specialist   . Type 2 diabetes mellitus with vascular disease ([place])   . Severe protein-calorie malnutrition ([place])   . Ischemic foot 08/20/2018  . Critical lower limb ischemia 08/03/2018  . Type 2 diabetes mellitus with foot ulcer, without long-term current use of insulin ([place])   . Gastroesophageal reflux disease   . Cellulitis of great toe of right foot 05/29/2018  . Atrial fibrillation, chronic   . Chest pain 07/25/2017  . PAF (paroxysmal atrial fibrillation) ([place]) 07/25/2017  . BPH (benign prostatic hyperplasia) 07/25/2017  . Shortness of breath 05/11/2017  . Hypothyroidism 05/11/2017  . Chronic diastolic CHF (congestive heart failure) ([place]) 05/11/2017  . Moderate aortic stenosis 06/12/2016  . RBBB 06/12/2016  . Peripheral vascular disease ([place]) 08/04/2012  . Essential hypertension, benign 08/04/2012  . Hyperlipidemia 08/04/2012  . Dizziness 08/04/2012    Jamey Reas  PT, DPT 04/13/2019, 10:41 PM  [facility] [address] [place], [place], [address] Phone: [phone]   Fax:  [phone]  Name: DEQUANN VANDERVELDEN MRN: [id number] Date of Birth: 08-22-1927

## 2019-04-15 ENCOUNTER — Ambulatory Visit: Payer: Medicare Other | Admitting: Physical Therapy

## 2019-04-15 ENCOUNTER — Other Ambulatory Visit: Payer: Self-pay

## 2019-04-15 ENCOUNTER — Encounter: Payer: Self-pay | Admitting: Physical Therapy

## 2019-04-15 DIAGNOSIS — R293 Abnormal posture: Secondary | ICD-10-CM

## 2019-04-15 DIAGNOSIS — M25662 Stiffness of left knee, not elsewhere classified: Secondary | ICD-10-CM

## 2019-04-15 DIAGNOSIS — R2681 Unsteadiness on feet: Secondary | ICD-10-CM

## 2019-04-15 DIAGNOSIS — R531 Weakness: Secondary | ICD-10-CM

## 2019-04-15 DIAGNOSIS — R601 Generalized edema: Secondary | ICD-10-CM

## 2019-04-15 DIAGNOSIS — Z9181 History of falling: Secondary | ICD-10-CM

## 2019-04-15 DIAGNOSIS — M6249 Contracture of muscle, multiple sites: Secondary | ICD-10-CM

## 2019-04-15 DIAGNOSIS — R2689 Other abnormalities of gait and mobility: Secondary | ICD-10-CM | POA: Diagnosis not present

## 2019-04-16 NOTE — Therapy (Signed)
[facility] Physical Therapy [address] [place], [place], [address] Phone: [phone]   Fax:  [phone]  Physical Therapy Treatment  Patient Details  Name: [name] MRN: [id number] Date of Birth: 03-06-1927 Referring Provider (PT): [name], MD   Encounter Date: 04/15/2019  PT End of Session - 04/15/19 1547    Visit Number  5    Number of Visits  85    Date for PT Re-Evaluation  06/30/19    Authorization Type  [employer] Medicare    Authorization Time Period  $30 co-pay    PT Start Time  1401    PT Stop Time  8242    PT Time Calculation (min)  44 min    Equipment Utilized During Treatment  Gait belt    Activity Tolerance  Patient tolerated treatment well    Behavior During Therapy  [facility] for tasks assessed/performed       Past Medical History:  Diagnosis Date  . Arthritis   . Borderline diabetic   . Diabetes mellitus without complication ([place])   . Glaucoma   . Hyperlipidemia   . Hypertension   . PVD (peripheral vascular disease) ([place])   . Sleep apnea    Cpap ordered but doesnt use  . Urinary retention due to benign prostatic hyperplasia 12/28/2018    Past Surgical History:  Procedure Laterality Date  . ABDOMINAL AORTOGRAM W/LOWER EXTREMITY N/A 07/27/2018   Procedure: ABDOMINAL AORTOGRAM W/LOWER EXTREMITY;  Surgeon: Lorretta Harp, MD;  Location: [place] [facility];  Service: Cardiovascular;  Laterality: N/A;  . AMPUTATION Right 08/28/2018   Procedure: RIGHT BELOW KNEE AMPUTATION;  Surgeon: [name], MD;  Location: [place];  Service: Orthopedics;  Laterality: Right;  . AMPUTATION Left 12/02/2018   Procedure: LEFT TRANSMETATARSAL AMPUTATION AND NEGATIVE PRESSURE WOUND VAC PLACEMENT;  Surgeon: [name], MD;  Location: [place];  Service: Orthopedics;  Laterality: Left;  . AMPUTATION Left 12/18/2018   Procedure: LEFT BELOW KNEE AMPUTATION;  Surgeon: [name], MD;  Location: [place];  Service: Orthopedics;  Laterality: Left;  .  APPENDECTOMY  1943  . BELOW KNEE LEG AMPUTATION Left 12/18/2018  . CATARACT EXTRACTION  2012   x2  . COLONOSCOPY N/A 11/01/2014   Procedure: COLONOSCOPY;  Surgeon: Aviva Signs, MD;  [facility];  Service: Gastroenterology;  Laterality: N/A;  . ESOPHAGOGASTRODUODENOSCOPY N/A 11/01/2014   Procedure: ESOPHAGOGASTRODUODENOSCOPY (EGD);  Surgeon: Aviva Signs, MD;  [facility];  Service: Gastroenterology;  Laterality: N/A;  . HERNIA REPAIR Left   . INGUINAL HERNIA REPAIR Right 03/01/2013   Procedure: RIGHT INGUINAL HERNIORRHAPHY;  Surgeon: Jamesetta So, MD;  Location: [facility];  Service: General;  Laterality: Right;  . INSERTION OF MESH Right 03/01/2013   Procedure: INSERTION OF MESH;  Surgeon: Jamesetta So, MD;  Location: [facility];  Service: General;  Laterality: Right;  . LOWER EXTREMITY ANGIOGRAPHY Right 08/25/2018   Procedure: LOWER EXTREMITY ANGIOGRAPHY;  Surgeon: Wellington Hampshire, MD;  Location: [place] [facility];  Service: Cardiovascular;  Laterality: Right;  . LOWER EXTREMITY ANGIOGRAPHY N/A 11/25/2018   Procedure: LOWER EXTREMITY ANGIOGRAPHY;  Surgeon: Wellington Hampshire, MD;  Location: [place] [facility];  Service: Cardiovascular;  Laterality: N/A;  . NM MYOCAR PERF WALL MOTION  06/05/2010   no significant ischemia  . PERIPHERAL VASCULAR ATHERECTOMY Right 08/03/2018   Procedure: PERIPHERAL VASCULAR ATHERECTOMY;  Surgeon: Lorretta Harp, MD;  Location: [place] [facility];  Service: Cardiovascular;  Laterality:  Right;  [name] PERIPHERAL VASCULAR ATHERECTOMY Left 11/23/2018   Procedure: PERIPHERAL VASCULAR ATHERECTOMY;  Surgeon: Lorretta Harp, MD;  Location: [place] [facility];  Service: Cardiovascular;  Laterality: Left;  sfa with dc balloon  . PERIPHERAL VASCULAR BALLOON ANGIOPLASTY Left 11/25/2018   Procedure: PERIPHERAL VASCULAR BALLOON ANGIOPLASTY;  Surgeon: Wellington Hampshire, MD;  Location: [place] [facility];  Service: Cardiovascular;  Laterality: Left;   popliteal  . PERIPHERAL VASCULAR INTERVENTION Left 11/25/2018   Procedure: PERIPHERAL VASCULAR INTERVENTION;  Surgeon: Wellington Hampshire, MD;  Location: [place] [facility];  Service: Cardiovascular;  Laterality: Left;  tibial peroneal trunk and peroneal stents   . stent  06/14/2010   left leg  . STUMP REVISION Left 01/20/2019   Procedure: REVISION LEFT BELOW KNEE AMPUTATION;  Surgeon: [name], MD;  Location: [place];  Service: Orthopedics;  Laterality: Left;  . US ECHOCARDIOGRAPHY  01/21/2006   moderate mitral annular ca+, mild MR, AOV moderately sclerotic.    There were no vitals filed for this visit.  Subjective Assessment - 04/15/19 1400    Subjective  He saw [facility] yesterday and they added pads to left socket.    Patient is accompained by:  Family member   son, [name]   Pertinent History  Bil TTAs, DM2, PAD, HTN, CHF, A-Fib, TIA, CKDst3    Patient Stated Goals  walk with prostheses in home & in/out house. Son would like him to be able to toilet & get in/out bed by himself.    Currently in Pain?  No/denies                       [facility] Adult PT Treatment/Exercise - 04/15/19 1400      Transfers   Transfers  Sit to Stand;Stand to Sit;Squat Pivot Transfers    Sit to Stand  3: Mod assist;With upper extremity assist;From chair/3-in-1;Other (comment);2: Max assist   modA sink & MaxA //bars, pulling on sink or bars   Sit to Stand Details  Manual facilitation for weight shifting;Verbal cues for safe use of DME/AE;Verbal cues for technique    Sit to Stand: Patient Percentage  40%    Stand to Sit  3: Mod assist;With upper extremity assist;To chair/3-in-1   ModA for controlled motion, using UEs on sink or bars   Stand to Sit Details (indicate cue type and reason)  Verbal cues for technique;Manual facilitation for weight shifting    Squat Pivot Transfers  2: Max assist   w/c to/from Nustep with seats adjacent, 3-4 points   Squat Pivot Transfer Details (indicate cue type  and reason)  manual / tactile, verbal & demo cues on weight shift by leaning trunk forward, lifting buttocks prior to pivot and UE use      Ambulation/Gait   Ambulation/Gait  Yes    Ambulation/Gait Assistance  2: Max assist   2 people for safety & son following w/c   Ambulation/Gait Assistance Details  manual, verbal & demo on wt shift over stance limb, PT blocking knee, upright posture and limb advancement.     Ambulation Distance (Feet)  5 Feet    Assistive device  Parallel bars;Prostheses   bil TTA prostheses   Gait Pattern  Step-to pattern;Decreased step length - right;Decreased step length - left;Decreased hip/knee flexion - right;Decreased hip/knee flexion - left;Right foot flat;Left foot flat;Right flexed knee in stance;Left flexed knee in stance;Shuffle;Lateral hip instability;Trunk flexed;Poor foot clearance - left;Poor foot clearance - right  Ambulation Surface  Level;Indoor      Neuro Re-ed    Neuro Re-ed Details   standing at sink with BUE support: upright posture 24minutes X 2 reps. wt shifts left/midline/right /midline and ant/midline/post/midline with 2-3 sec hold to feel / proprioception for location.       Knee/Hip Exercises: Stretches   Passive Hamstring Stretch  Right;Left;1 rep;30 seconds   seated in w/c with single foot on 8" stool   Passive Hamstring Stretch Limitations  leg press then position for 5 minutes / LE 2-3 times /day      Knee/Hip Exercises: Aerobic   Nustep  Level 5 BUEs & BLEs 3 min 2 sets with tactile cues for full ROM.    SpO2 97% HR 82      Prosthetics   Prosthetic Care Comments   prosthetist added 2 pretibial & 1 small popliteal pad. No changes to limb condition.      Current prosthetic wear tolerance (days/week)   daily    Current prosthetic wear tolerance (#hours/day)   2-3 hrs on, 2 hrs off, rotating awake hours    Current prosthetic weight-bearing tolerance (hours/day)   2 minutes standing without pain in limbs.     Edema  pitting edema     Residual limb condition   left limb: medial incision has rough circular area that may internal suture working out & redness at tibial tuberosity and medial hamstring tendon;  bilateral limbs: frail skin, normal moisture & temperature, minimal to no hair growth.     Education Provided  Skin check;Residual limb care;Prosthetic cleaning;Correct ply sock adjustment;Proper Donning;Proper wear schedule/adjustment;Other (comment)   see prosthetic care comments   Person(s) Educated  Patient;Child(ren)    Education Method  Explanation;Demonstration;Tactile cues;Verbal cues;Handout    Education Method  Verbalized understanding;Needs further instruction    Donning Prosthesis  Maximum assist   son donnes    Doffing Prosthesis  Moderate assist   son doffes              PT Short Term Goals - 04/01/19 2200      PT SHORT TERM GOAL #1   Title  Patient's son demonstrates proper donning & verbalizes proper cleaning. (All STGs Target Date: 04/30/2019)    Time  4    Period  Weeks    Status  New    Target Date  04/30/19      PT SHORT TERM GOAL #2   Title  Patient tolerates prostheses wear >8 hrs total / day without skin issues.    Time  4    Period  Weeks    Status  New    Target Date  04/30/19      PT SHORT TERM GOAL #3   Title  Sit to / from stand w/c to parallel bars pulling on bars with modA.    Time  4    Period  Weeks    Status  New    Target Date  04/30/19      PT SHORT TERM GOAL #4   Title  Patient tolerates standing with parallel bar support with minA for 3 minutes.    Time  4    Period  Weeks    Status  New    Target Date  04/30/19      PT SHORT TERM GOAL #5   Title  Patient ambulates 10' in parallel bars with modA.    Time  4    Period  Weeks    Status  New    Target Date  04/30/19        PT Long Term Goals - 04/01/19 2207      PT LONG TERM GOAL #1   Title  Patient and family verbalize and demonstrate understanding of proper prosthetic care to enable safe  utilization of prostheses. (All LTGs Target Date: 09/24/2019)    Time  6    Period  Months    Status  New    Target Date  09/24/19      PT LONG TERM GOAL #2   Title  Patient tolerates wear of bilateral prostheses >90% of awake hours without skin issues or limb pain to enable functional potential during his day.    Time  6    Period  Months    Status  New    Target Date  09/24/19      PT LONG TERM GOAL #3   Title  Squat pivot transfer bed to w/c or chair to w/c with prostheses modified independent.    Time  6    Period  Months    Status  New    Target Date  09/24/19      PT LONG TERM GOAL #4   Title  Sit to / from stand elevated w/c to walker with prostheses with supervision.    Time  6    Period  Months    Status  New    Target Date  09/24/19      PT LONG TERM GOAL #5   Title  Standing balance with walker support with bilateral prostheses static stance for 5 minutes, scanning environment and reaches 2" anteriorly with supervision.    Time  6    Period  Months    Status  New    Target Date  09/24/19      Additional Long Term Goals   Additional Long Term Goals  Yes      PT LONG TERM GOAL #6   Title  Patient ambulates 8' between 2 chairs including turning 90* to position to sit with RW & prostheses with minA from family safely.    Time  6    Period  Months    Status  New    Target Date  09/24/19            Plan - 04/15/19 1910    Clinical Impression Statement  PT using NuStep to work on LE strength, ROM & endurance.  PT instructed & worked on transfers with bil. TTA prostheses.  Patient required less assistance for sit/stand to sink today.    Personal Factors and Comorbidities  Age;Comorbidity 3+;Fitness;Time since onset of injury/illness/exacerbation    Comorbidities  Bil TTAs, DM2, PAD, HTN, CHF, A-Fib, TIA, CKDst3,    Examination-Activity Limitations  Locomotion Level;Sit;Stand;Toileting;Transfers    Examination-Participation Restrictions  Community Activity     Stability/Clinical Decision Making  Evolving/Moderate complexity    Rehab Potential  Good    PT Frequency  2x / week    PT Duration  Other (comment)   25 weeks (6 months)   PT Treatment/Interventions  ADLs/Self Care Home Management;DME Instruction;Gait training;Stair training;Functional mobility training;Therapeutic activities;Therapeutic exercise;Balance training;Neuromuscular re-education;Patient/family education;Prosthetic Training;Manual techniques;Passive range of motion;Vestibular    PT Next Visit Plan  review prosthetic care, update HEP, sit to/from stand & standing in parallel bars, NuStep    Consulted and Agree with Plan of Care  Patient;Family member/caregiver    Family Member Consulted  son, Roma Bierlein  Patient will benefit from skilled therapeutic intervention in order to improve the following deficits and impairments:  Abnormal gait, Cardiopulmonary status limiting activity, Decreased activity tolerance, Decreased balance, Decreased endurance, Decreased knowledge of use of DME, Decreased mobility, Decreased range of motion, Decreased scar mobility, Decreased strength, Increased edema, Impaired flexibility, Impaired UE functional use, Postural dysfunction, Prosthetic Dependency, Pain  Visit Diagnosis: Unsteadiness on feet  Other abnormalities of gait and mobility  Abnormal posture  Weakness generalized  History of fall  Contracture of muscle, multiple sites  Stiffness of left knee, not elsewhere classified  Generalized edema     Problem List Patient Active Problem List   Diagnosis Date Noted  . Weakness generalized 04/05/2019  . S/P BKA (below knee amputation) unilateral, left ([place]) 02/15/2019  . Pressure injury of skin 02/03/2019  . TIA (transient ischemic attack) 02/03/2019  . Transient speech disturbance 02/02/2019  . Chronic indwelling Foley catheter 01/22/2019  . Postoperative wound infection 01/20/2019  . Normocytic anemia 01/18/2019  .  Cellulitis 01/18/2019  . Infection of deep incisional surgical site after procedure   . Abnormal urinalysis   . Labile blood glucose   . Urinary retention   . S/P bilateral BKA (below knee amputation) ([place])   . Acute blood loss anemia   . Urinary retention due to benign prostatic hyperplasia 12/28/2018  . Unilateral complete BKA, left, initial encounter ([place]) 12/25/2018  . Dehiscence of amputation stump ([place])   . History of transmetatarsal amputation of left foot ([place]) 12/09/2018  . Critical limb ischemia with history of revascularization of same extremity   . Gangrene of left foot ([place])   . Diabetic ulcer of toe of left foot associated with type 2 diabetes mellitus ([place])   . Cellulitis of left lower extremity   . Toe infection 11/18/2018  . Peripheral edema   . Acute on chronic diastolic CHF (congestive heart failure) ([place]) 11/09/2018  . S/P BKA (below knee amputation) unilateral, right ([place]) 10/05/2018  . Hyponatremia   . Essential hypertension   . Postoperative pain   . Pain of left heel   . Unable to maintain weight-bearing   . Type 2 diabetes mellitus with diabetic peripheral angiopathy with gangrene ([place])   . Anemia due to acute blood loss   . Chronic kidney disease, stage 3a   . Acquired absence of right leg below knee ([place]) 09/07/2018  . Gangrene of right foot ([place])   . DNR (do not resuscitate) discussion   . Goals of care, counseling/discussion   . Palliative care by specialist   . Type 2 diabetes mellitus with vascular disease ([place])   . Severe protein-calorie malnutrition ([place])   . Ischemic foot 08/20/2018  . Critical lower limb ischemia 08/03/2018  . Type 2 diabetes mellitus with foot ulcer, without long-term current use of insulin ([place])   . Gastroesophageal reflux disease   . Cellulitis of great toe of right foot 05/29/2018  . Atrial fibrillation, chronic   . Chest pain 07/25/2017  . PAF (paroxysmal atrial fibrillation) ([place]) 07/25/2017  . BPH (benign prostatic  hyperplasia) 07/25/2017  . Shortness of breath 05/11/2017  . Hypothyroidism 05/11/2017  . Chronic diastolic CHF (congestive heart failure) ([place]) 05/11/2017  . Moderate aortic stenosis 06/12/2016  . RBBB 06/12/2016  . Peripheral vascular disease ([place]) 08/04/2012  . Essential hypertension, benign 08/04/2012  . Hyperlipidemia 08/04/2012  . Dizziness 08/04/2012    Jamey Reas PT, DPT 04/16/2019, 12:14 PM  [facility] Physical Therapy [address] [place], [place], [address] Phone:  [phone]   Fax:  [phone]  Name: KINCAID TIGER MRN: [id number] Date of Birth: 1927/03/12

## 2019-04-19 ENCOUNTER — Other Ambulatory Visit: Payer: Self-pay

## 2019-04-19 ENCOUNTER — Encounter: Payer: Self-pay | Admitting: Physical Therapy

## 2019-04-19 ENCOUNTER — Ambulatory Visit: Payer: Medicare Other | Admitting: Physical Therapy

## 2019-04-19 DIAGNOSIS — M25662 Stiffness of left knee, not elsewhere classified: Secondary | ICD-10-CM

## 2019-04-19 DIAGNOSIS — R2681 Unsteadiness on feet: Secondary | ICD-10-CM

## 2019-04-19 DIAGNOSIS — R293 Abnormal posture: Secondary | ICD-10-CM

## 2019-04-19 DIAGNOSIS — R531 Weakness: Secondary | ICD-10-CM

## 2019-04-19 DIAGNOSIS — R601 Generalized edema: Secondary | ICD-10-CM

## 2019-04-19 DIAGNOSIS — M6249 Contracture of muscle, multiple sites: Secondary | ICD-10-CM

## 2019-04-19 DIAGNOSIS — R2689 Other abnormalities of gait and mobility: Secondary | ICD-10-CM | POA: Diagnosis not present

## 2019-04-19 NOTE — Therapy (Signed)
[facility] [address] [place], [place], [address] Phone: [phone]   Fax:  [phone]  Physical Therapy Treatment  Patient Details  Name: [name] MRN: [id number] Date of Birth: 10/08/1927 Referring Provider (PT): [name], MD   Encounter Date: 04/19/2019  PT End of Session - 04/19/19 1311    Visit Number  6    Number of Visits  50    Date for PT Re-Evaluation  06/30/19    Authorization Type  [employer] Medicare    Authorization Time Period  $30 co-pay    PT Start Time  0930    PT Stop Time  1015    PT Time Calculation (min)  45 min    Equipment Utilized During Treatment  Gait belt    Activity Tolerance  Patient tolerated treatment well    Behavior During Therapy  [facility] for tasks assessed/performed       Past Medical History:  Diagnosis Date  . Arthritis   . Borderline diabetic   . Diabetes mellitus without complication ([place])   . Glaucoma   . Hyperlipidemia   . Hypertension   . PVD (peripheral vascular disease) ([place])   . Sleep apnea    Cpap ordered but doesnt use  . Urinary retention due to benign prostatic hyperplasia 12/28/2018    Past Surgical History:  Procedure Laterality Date  . ABDOMINAL AORTOGRAM W/LOWER EXTREMITY N/A 07/27/2018   Procedure: ABDOMINAL AORTOGRAM W/LOWER EXTREMITY;  Surgeon: Lorretta Harp, MD;  Location: [place] [facility];  Service: Cardiovascular;  Laterality: N/A;  . AMPUTATION Right 08/28/2018   Procedure: RIGHT BELOW KNEE AMPUTATION;  Surgeon: [name], MD;  Location: [place];  Service: Orthopedics;  Laterality: Right;  . AMPUTATION Left 12/02/2018   Procedure: LEFT TRANSMETATARSAL AMPUTATION AND NEGATIVE PRESSURE WOUND VAC PLACEMENT;  Surgeon: [name], MD;  Location: [place];  Service: Orthopedics;  Laterality: Left;  . AMPUTATION Left 12/18/2018   Procedure: LEFT BELOW KNEE AMPUTATION;  Surgeon: [name], MD;  Location: [place];  Service: Orthopedics;  Laterality: Left;  .  APPENDECTOMY  1943  . BELOW KNEE LEG AMPUTATION Left 12/18/2018  . CATARACT EXTRACTION  2012   x2  . COLONOSCOPY N/A 11/01/2014   Procedure: COLONOSCOPY;  Surgeon: Aviva Signs, MD;  [facility];  Service: Gastroenterology;  Laterality: N/A;  . ESOPHAGOGASTRODUODENOSCOPY N/A 11/01/2014   Procedure: ESOPHAGOGASTRODUODENOSCOPY (EGD);  Surgeon: Aviva Signs, MD;  [facility];  Service: Gastroenterology;  Laterality: N/A;  . HERNIA REPAIR Left   . INGUINAL HERNIA REPAIR Right 03/01/2013   Procedure: RIGHT INGUINAL HERNIORRHAPHY;  Surgeon: Jamesetta So, MD;  Location: [facility];  Service: General;  Laterality: Right;  . INSERTION OF MESH Right 03/01/2013   Procedure: INSERTION OF MESH;  Surgeon: Jamesetta So, MD;  Location: [facility];  Service: General;  Laterality: Right;  . LOWER EXTREMITY ANGIOGRAPHY Right 08/25/2018   Procedure: LOWER EXTREMITY ANGIOGRAPHY;  Surgeon: Wellington Hampshire, MD;  Location: [place] [facility];  Service: Cardiovascular;  Laterality: Right;  . LOWER EXTREMITY ANGIOGRAPHY N/A 11/25/2018   Procedure: LOWER EXTREMITY ANGIOGRAPHY;  Surgeon: Wellington Hampshire, MD;  Location: [place] [facility];  Service: Cardiovascular;  Laterality: N/A;  . NM MYOCAR PERF WALL MOTION  06/05/2010   no significant ischemia  . PERIPHERAL VASCULAR ATHERECTOMY Right 08/03/2018   Procedure: PERIPHERAL VASCULAR ATHERECTOMY;  Surgeon: Lorretta Harp, MD;  Location: [place] [facility];  Service: Cardiovascular;  Laterality:  Right;  [name] PERIPHERAL VASCULAR ATHERECTOMY Left 11/23/2018   Procedure: PERIPHERAL VASCULAR ATHERECTOMY;  Surgeon: Lorretta Harp, MD;  Location: [place] [facility];  Service: Cardiovascular;  Laterality: Left;  sfa with dc balloon  . PERIPHERAL VASCULAR BALLOON ANGIOPLASTY Left 11/25/2018   Procedure: PERIPHERAL VASCULAR BALLOON ANGIOPLASTY;  Surgeon: Wellington Hampshire, MD;  Location: [place] [facility];  Service: Cardiovascular;  Laterality: Left;   popliteal  . PERIPHERAL VASCULAR INTERVENTION Left 11/25/2018   Procedure: PERIPHERAL VASCULAR INTERVENTION;  Surgeon: Wellington Hampshire, MD;  Location: [place] [facility];  Service: Cardiovascular;  Laterality: Left;  tibial peroneal trunk and peroneal stents   . stent  06/14/2010   left leg  . STUMP REVISION Left 01/20/2019   Procedure: REVISION LEFT BELOW KNEE AMPUTATION;  Surgeon: [name], MD;  Location: [place];  Service: Orthopedics;  Laterality: Left;  . US ECHOCARDIOGRAPHY  01/21/2006   moderate mitral annular ca+, mild MR, AOV moderately sclerotic.    There were no vitals filed for this visit.  Subjective Assessment - 04/19/19 0935    Subjective  He is wearing bilateral prostheses most of awake hours.    Patient is accompained by:  Family member   son, Ryaan Vanwagoner   Pertinent History  Bil TTAs, DM2, PAD, HTN, CHF, A-Fib, TIA, CKDst3    Patient Stated Goals  walk with prostheses in home & in/out house. Son would like him to be able to toilet & get in/out bed by himself.    Currently in Pain?  No/denies                       [facility] Adult PT Treatment/Exercise - 04/19/19 0935      Transfers   Transfers  Sit to Stand;Stand to Sit;Squat Pivot Transfers    Sit to Stand  3: Mod assist;With upper extremity assist;From chair/3-in-1;Other (comment)   modA sink, pulling on sink   Sit to Stand Details  Manual facilitation for weight shifting;Verbal cues for safe use of DME/AE;Verbal cues for technique    Sit to Stand: Patient Percentage  40%    Stand to Sit  3: Mod assist;With upper extremity assist;To chair/3-in-1   ModA for controlled motion, using UEs on sink   Stand to Sit Details (indicate cue type and reason)  Verbal cues for technique;Manual facilitation for weight shifting    Squat Pivot Transfers  2: Max assist   w/c to/from Nustep with seats adjacent, 3-4 points   Squat Pivot Transfer Details (indicate cue type and reason)  PT demo & verbal cues to pt's  daughter while assisting patient      Ambulation/Gait   Ambulation/Gait  --    Ambulation/Gait Assistance  --    Ambulation Distance (Feet)  --    Assistive device  --    Gait Pattern  --      Neuro Re-ed    Neuro Re-ed Details   --      Knee/Hip Exercises: Stretches   Passive Hamstring Stretch  Right;Left;1 rep;30 seconds   seated in w/c with single foot on 8" stool   Passive Hamstring Stretch Limitations  leg press then position for 5 minutes / LE 2-3 times /day      Knee/Hip Exercises: Aerobic   Nustep  Level 5 BUEs & BLEs 5 min 1 sets with tactile cues for full ROM.    SpO2 97% HR 79     Knee/Hip Exercises: Seated   Other  Seated Knee/Hip Exercises  Leg press / closed chain extension - pt positioned with BLEs on chair without armrests & feet against cabinet alternating extending LEs 5 sec hold 10 reps ea    Other Seated Knee/Hip Exercises  w/c propeling with feet flexion 20' & backwards extension 47' with [place] Comments   prosthetist added 2 pretibial & 1 small popliteal pad. No changes to limb condition.      Current prosthetic wear tolerance (days/week)   daily    Current prosthetic wear tolerance (#hours/day)   2-3 hrs on, 2 hrs off, rotating awake hours    Current prosthetic weight-bearing tolerance (hours/day)   2 minutes standing without pain in limbs.     Edema  pitting edema    Residual limb condition   left limb: medial incision has rough circular area that may internal suture working out & redness at tibial tuberosity and medial hamstring tendon;  bilateral limbs: frail skin, normal moisture & temperature, minimal to no hair growth.     Education Provided  Skin check;Residual limb care;Prosthetic cleaning;Correct ply sock adjustment;Proper Donning;Proper wear schedule/adjustment;Other (comment)   see prosthetic care comments              PT Short Term Goals - 04/19/19 2144      PT SHORT TERM GOAL #1   Title  Patient's son  demonstrates proper donning & verbalizes proper cleaning. (All STGs Target Date: 04/30/2019)    Time  4    Period  Weeks    Status  On-going    Target Date  04/30/19      PT SHORT TERM GOAL #2   Title  Patient tolerates prostheses wear >8 hrs total / day without skin issues.    Time  4    Period  Weeks    Status  On-going    Target Date  04/30/19      PT SHORT TERM GOAL #3   Title  Sit to / from stand w/c to parallel bars pulling on bars with modA.    Time  4    Period  Weeks    Status  On-going    Target Date  04/30/19      PT SHORT TERM GOAL #4   Title  Patient tolerates standing with parallel bar support with minA for 3 minutes.    Time  4    Period  Weeks    Status  On-going    Target Date  04/30/19      PT SHORT TERM GOAL #5   Title  Patient ambulates 10' in parallel bars with modA.    Time  4    Period  Weeks    Status  On-going    Target Date  04/30/19        PT Long Term Goals - 04/19/19 2144      PT LONG TERM GOAL #1   Title  Patient and family verbalize and demonstrate understanding of proper prosthetic care to enable safe utilization of prostheses. (All LTGs Target Date: 09/24/2019)    Time  6    Period  Months    Status  On-going    Target Date  09/24/19      PT LONG TERM GOAL #2   Title  Patient tolerates wear of bilateral prostheses >90% of awake hours without skin issues or limb pain to enable functional potential during his day.    Time  6    Period  Months    Status  On-going    Target Date  09/24/19      PT LONG TERM GOAL #3   Title  Squat pivot transfer bed to w/c or chair to w/c with prostheses modified independent.    Time  6    Period  Months    Status  On-going    Target Date  09/24/19      PT LONG TERM GOAL #4   Title  Sit to / from stand elevated w/c to walker with prostheses with supervision.    Time  6    Period  Months    Status  On-going    Target Date  09/24/19      PT LONG TERM GOAL #5   Title  Standing balance with  walker support with bilateral prostheses static stance for 5 minutes, scanning environment and reaches 2" anteriorly with supervision.    Time  6    Period  Months    Status  On-going    Target Date  09/24/19      PT LONG TERM GOAL #6   Title  Patient ambulates 81' between 2 chairs including turning 90* to position to sit with RW & prostheses with minA from family safely.    Time  6    Period  Months    Status  On-going    Target Date  09/24/19            Plan - 04/19/19 2145    Clinical Impression Statement  PT session focused on BLE strengthening exercises. PT educated his daughter on transfer techniques to encourage more participation from patient.    Personal Factors and Comorbidities  Age;Comorbidity 3+;Fitness;Time since onset of injury/illness/exacerbation    Comorbidities  Bil TTAs, DM2, PAD, HTN, CHF, A-Fib, TIA, CKDst3,    Examination-Activity Limitations  Locomotion Level;Sit;Stand;Toileting;Transfers    Examination-Participation Restrictions  Community Activity    Stability/Clinical Decision Making  Evolving/Moderate complexity    Rehab Potential  Good    PT Frequency  2x / week    PT Duration  Other (comment)   25 weeks (6 months)   PT Treatment/Interventions  ADLs/Self Care Home Management;DME Instruction;Gait training;Stair training;Functional mobility training;Therapeutic activities;Therapeutic exercise;Balance training;Neuromuscular re-education;Patient/family education;Prosthetic Training;Manual techniques;Passive range of motion;Vestibular    PT Next Visit Plan  review prosthetic care, update HEP, sit to/from stand & standing in parallel bars, NuStep    Consulted and Agree with Plan of Care  Patient;Family member/caregiver    Family Member Consulted  son, Tell Rozelle       Patient will benefit from skilled therapeutic intervention in order to improve the following deficits and impairments:  Abnormal gait, Cardiopulmonary status limiting activity, Decreased  activity tolerance, Decreased balance, Decreased endurance, Decreased knowledge of use of DME, Decreased mobility, Decreased range of motion, Decreased scar mobility, Decreased strength, Increased edema, Impaired flexibility, Impaired UE functional use, Postural dysfunction, Prosthetic Dependency, Pain  Visit Diagnosis: Unsteadiness on feet  Other abnormalities of gait and mobility  Abnormal posture  Weakness generalized  Contracture of muscle, multiple sites  Stiffness of left knee, not elsewhere classified  Generalized edema     Problem List Patient Active Problem List   Diagnosis Date Noted  . Weakness generalized 04/05/2019  . S/P BKA (below knee amputation) unilateral, left ([place]) 02/15/2019  . Pressure injury of skin 02/03/2019  . TIA (transient ischemic attack) 02/03/2019  . Transient speech disturbance 02/02/2019  . Chronic indwelling Foley  catheter 01/22/2019  . Postoperative wound infection 01/20/2019  . Normocytic anemia 01/18/2019  . Cellulitis 01/18/2019  . Infection of deep incisional surgical site after procedure   . Abnormal urinalysis   . Labile blood glucose   . Urinary retention   . S/P bilateral BKA (below knee amputation) ([place])   . Acute blood loss anemia   . Urinary retention due to benign prostatic hyperplasia 12/28/2018  . Unilateral complete BKA, left, initial encounter ([place]) 12/25/2018  . Dehiscence of amputation stump ([place])   . History of transmetatarsal amputation of left foot ([place]) 12/09/2018  . Critical limb ischemia with history of revascularization of same extremity   . Gangrene of left foot ([place])   . Diabetic ulcer of toe of left foot associated with type 2 diabetes mellitus ([place])   . Cellulitis of left lower extremity   . Toe infection 11/18/2018  . Peripheral edema   . Acute on chronic diastolic CHF (congestive heart failure) ([place]) 11/09/2018  . S/P BKA (below knee amputation) unilateral, right ([place]) 10/05/2018  . Hyponatremia   .  Essential hypertension   . Postoperative pain   . Pain of left heel   . Unable to maintain weight-bearing   . Type 2 diabetes mellitus with diabetic peripheral angiopathy with gangrene ([place])   . Anemia due to acute blood loss   . Chronic kidney disease, stage 3a   . Acquired absence of right leg below knee ([place]) 09/07/2018  . Gangrene of right foot ([place])   . DNR (do not resuscitate) discussion   . Goals of care, counseling/discussion   . Palliative care by specialist   . Type 2 diabetes mellitus with vascular disease ([place])   . Severe protein-calorie malnutrition ([place])   . Ischemic foot 08/20/2018  . Critical lower limb ischemia 08/03/2018  . Type 2 diabetes mellitus with foot ulcer, without long-term current use of insulin ([place])   . Gastroesophageal reflux disease   . Cellulitis of great toe of right foot 05/29/2018  . Atrial fibrillation, chronic   . Chest pain 07/25/2017  . PAF (paroxysmal atrial fibrillation) ([place]) 07/25/2017  . BPH (benign prostatic hyperplasia) 07/25/2017  . Shortness of breath 05/11/2017  . Hypothyroidism 05/11/2017  . Chronic diastolic CHF (congestive heart failure) ([place]) 05/11/2017  . Moderate aortic stenosis 06/12/2016  . RBBB 06/12/2016  . Peripheral vascular disease ([place]) 08/04/2012  . Essential hypertension, benign 08/04/2012  . Hyperlipidemia 08/04/2012  . Dizziness 08/04/2012    Jamey Reas PT, DPT 04/19/2019, 9:48 PM  [facility] [address] [place], [place], [address] Phone: [phone]   Fax:  [phone]  Name: [name] MRN: [id number] Date of Birth: 03-16-1927

## 2019-04-22 ENCOUNTER — Ambulatory Visit (INDEPENDENT_AMBULATORY_CARE_PROVIDER_SITE_OTHER): Payer: Medicare Other | Admitting: Physical Therapy

## 2019-04-22 ENCOUNTER — Other Ambulatory Visit: Payer: Self-pay

## 2019-04-22 DIAGNOSIS — R531 Weakness: Secondary | ICD-10-CM

## 2019-04-22 DIAGNOSIS — R2689 Other abnormalities of gait and mobility: Secondary | ICD-10-CM | POA: Diagnosis not present

## 2019-04-22 DIAGNOSIS — M6281 Muscle weakness (generalized): Secondary | ICD-10-CM

## 2019-04-22 DIAGNOSIS — M79672 Pain in left foot: Secondary | ICD-10-CM | POA: Diagnosis not present

## 2019-04-22 DIAGNOSIS — M25661 Stiffness of right knee, not elsewhere classified: Secondary | ICD-10-CM

## 2019-04-22 DIAGNOSIS — R2681 Unsteadiness on feet: Secondary | ICD-10-CM

## 2019-04-22 DIAGNOSIS — M25662 Stiffness of left knee, not elsewhere classified: Secondary | ICD-10-CM

## 2019-04-22 DIAGNOSIS — R293 Abnormal posture: Secondary | ICD-10-CM

## 2019-04-22 NOTE — Therapy (Signed)
[facility] [address] [place], [place], [address] Phone: [phone]   Fax:  [phone]  Physical Therapy Treatment  Patient Details  Name: [name] MRN: [id number] Date of Birth: 10-Jan-1928 Referring Provider (PT): [name], MD   Encounter Date: 04/22/2019  PT End of Session - 04/22/19 1943    Visit Number  7    Number of Visits  50    Date for PT Re-Evaluation  06/30/19    Authorization Type  [employer] Medicare    Authorization Time Period  $30 co-pay    PT Start Time  1400    PT Stop Time  1445    PT Time Calculation (min)  45 min    Equipment Utilized During Treatment  Gait belt    Activity Tolerance  Patient tolerated treatment well    Behavior During Therapy  [facility] for tasks assessed/performed       Past Medical History:  Diagnosis Date  . Arthritis   . Borderline diabetic   . Diabetes mellitus without complication ([place])   . Glaucoma   . Hyperlipidemia   . Hypertension   . PVD (peripheral vascular disease) ([place])   . Sleep apnea    Cpap ordered but doesnt use  . Urinary retention due to benign prostatic hyperplasia 12/28/2018    Past Surgical History:  Procedure Laterality Date  . ABDOMINAL AORTOGRAM W/LOWER EXTREMITY N/A 07/27/2018   Procedure: ABDOMINAL AORTOGRAM W/LOWER EXTREMITY;  Surgeon: Lorretta Harp, MD;  Location: [place] [facility];  Service: Cardiovascular;  Laterality: N/A;  . AMPUTATION Right 08/28/2018   Procedure: RIGHT BELOW KNEE AMPUTATION;  Surgeon: [name], MD;  Location: [place];  Service: Orthopedics;  Laterality: Right;  . AMPUTATION Left 12/02/2018   Procedure: LEFT TRANSMETATARSAL AMPUTATION AND NEGATIVE PRESSURE WOUND VAC PLACEMENT;  Surgeon: [name], MD;  Location: [place];  Service: Orthopedics;  Laterality: Left;  . AMPUTATION Left 12/18/2018   Procedure: LEFT BELOW KNEE AMPUTATION;  Surgeon: [name], MD;  Location: [place];  Service: Orthopedics;  Laterality: Left;  .  APPENDECTOMY  1943  . BELOW KNEE LEG AMPUTATION Left 12/18/2018  . CATARACT EXTRACTION  2012   x2  . COLONOSCOPY N/A 11/01/2014   Procedure: COLONOSCOPY;  Surgeon: Aviva Signs, MD;  [facility];  Service: Gastroenterology;  Laterality: N/A;  . ESOPHAGOGASTRODUODENOSCOPY N/A 11/01/2014   Procedure: ESOPHAGOGASTRODUODENOSCOPY (EGD);  Surgeon: Aviva Signs, MD;  [facility];  Service: Gastroenterology;  Laterality: N/A;  . HERNIA REPAIR Left   . INGUINAL HERNIA REPAIR Right 03/01/2013   Procedure: RIGHT INGUINAL HERNIORRHAPHY;  Surgeon: Jamesetta So, MD;  Location: [facility];  Service: General;  Laterality: Right;  . INSERTION OF MESH Right 03/01/2013   Procedure: INSERTION OF MESH;  Surgeon: Jamesetta So, MD;  [facility];  Service: General;  Laterality: Right;  . LOWER EXTREMITY ANGIOGRAPHY Right 08/25/2018   Procedure: LOWER EXTREMITY ANGIOGRAPHY;  Surgeon: Wellington Hampshire, MD;  Location: [place] [facility];  Service: Cardiovascular;  Laterality: Right;  . LOWER EXTREMITY ANGIOGRAPHY N/A 11/25/2018   Procedure: LOWER EXTREMITY ANGIOGRAPHY;  Surgeon: Wellington Hampshire, MD;  Location: [place] [facility];  Service: Cardiovascular;  Laterality: N/A;  . NM MYOCAR PERF WALL MOTION  06/05/2010   no significant ischemia  . PERIPHERAL VASCULAR ATHERECTOMY Right 08/03/2018   Procedure: PERIPHERAL VASCULAR ATHERECTOMY;  Surgeon: Lorretta Harp, MD;  Location: [place] [facility];  Service: Cardiovascular;  Laterality:  Right;  [name] PERIPHERAL VASCULAR ATHERECTOMY Left 11/23/2018   Procedure: PERIPHERAL VASCULAR ATHERECTOMY;  Surgeon: Lorretta Harp, MD;  Location: [place] [facility];  Service: Cardiovascular;  Laterality: Left;  sfa with dc balloon  . PERIPHERAL VASCULAR BALLOON ANGIOPLASTY Left 11/25/2018   Procedure: PERIPHERAL VASCULAR BALLOON ANGIOPLASTY;  Surgeon: Wellington Hampshire, MD;  Location: [place] [facility];  Service: Cardiovascular;  Laterality: Left;   popliteal  . PERIPHERAL VASCULAR INTERVENTION Left 11/25/2018   Procedure: PERIPHERAL VASCULAR INTERVENTION;  Surgeon: Wellington Hampshire, MD;  Location: [place] [facility];  Service: Cardiovascular;  Laterality: Left;  tibial peroneal trunk and peroneal stents   . stent  06/14/2010   left leg  . STUMP REVISION Left 01/20/2019   Procedure: REVISION LEFT BELOW KNEE AMPUTATION;  Surgeon: [name], MD;  Location: [place];  Service: Orthopedics;  Laterality: Left;  . US ECHOCARDIOGRAPHY  01/21/2006   moderate mitral annular ca+, mild MR, AOV moderately sclerotic.    There were no vitals filed for this visit.  Subjective Assessment - 04/22/19 1538    Subjective  relays some pain in his wrists but no other complaints.    Patient is accompained by:  Family member   son, Kennedy Bohanon   Pertinent History  Bil TTAs, DM2, PAD, HTN, CHF, A-Fib, TIA, CKDst3    Patient Stated Goals  walk with prostheses in home & in/out house. Son would like him to be able to toilet & get in/out bed by himself.                       [place] [facility] PT Treatment/Exercise - 04/22/19 0001      Transfers   Transfers  Sit to Stand;Stand to Sit;Squat Pivot Transfers    Sit to Stand  3: Mod assist;With upper extremity assist;From chair/3-in-1;Other (comment)    Sit to Stand Details  Manual facilitation for weight shifting;Verbal cues for safe use of DME/AE;Verbal cues for technique    Sit to Stand Details (indicate cue type and reason)  at sink 4 reps    Sit to Stand: Patient Percentage  40%    Stand to Sit  3: Mod assist;With upper extremity assist;To chair/3-in-1    Stand to Sit Details (indicate cue type and reason)  Verbal cues for technique;Manual facilitation for weight shifting    Squat Pivot Transfers  2: Max assist   over to nu step   Comments  also tfer from nustep seat to wheelchair lateral scoot tfer,min A with heavy cuing for technique      Exercises   Other Exercises   standing at sink mini  squats 5 reps X 3 sets, heavy cuing for upright posture      Knee/Hip Exercises: Stretches   Active Hamstring Stretch  Right;Left;2 reps;30 seconds    Active Hamstring Stretch Limitations  seated, foot propped on 8 inch step               PT Short Term Goals - 04/19/19 2144      PT SHORT TERM GOAL #1   Title  Patient's son demonstrates proper donning & verbalizes proper cleaning. (All STGs Target Date: 04/30/2019)    Time  4    Period  Weeks    Status  On-going    Target Date  04/30/19      PT SHORT TERM GOAL #2   Title  Patient tolerates prostheses wear >8 hrs total / day without skin issues.  Time  4    Period  Weeks    Status  On-going    Target Date  04/30/19      PT SHORT TERM GOAL #3   Title  Sit to / from stand w/c to parallel bars pulling on bars with modA.    Time  4    Period  Weeks    Status  On-going    Target Date  04/30/19      PT SHORT TERM GOAL #4   Title  Patient tolerates standing with parallel bar support with minA for 3 minutes.    Time  4    Period  Weeks    Status  On-going    Target Date  04/30/19      PT SHORT TERM GOAL #5   Title  Patient ambulates 10' in parallel bars with modA.    Time  4    Period  Weeks    Status  On-going    Target Date  04/30/19        PT Long Term Goals - 04/19/19 2144      PT LONG TERM GOAL #1   Title  Patient and family verbalize and demonstrate understanding of proper prosthetic care to enable safe utilization of prostheses. (All LTGs Target Date: 09/24/2019)    Time  6    Period  Months    Status  On-going    Target Date  09/24/19      PT LONG TERM GOAL #2   Title  Patient tolerates wear of bilateral prostheses >90% of awake hours without skin issues or limb pain to enable functional potential during his day.    Time  6    Period  Months    Status  On-going    Target Date  09/24/19      PT LONG TERM GOAL #3   Title  Squat pivot transfer bed to w/c or chair to w/c with prostheses modified  independent.    Time  6    Period  Months    Status  On-going    Target Date  09/24/19      PT LONG TERM GOAL #4   Title  Sit to / from stand elevated w/c to walker with prostheses with supervision.    Time  6    Period  Months    Status  On-going    Target Date  09/24/19      PT LONG TERM GOAL #5   Title  Standing balance with walker support with bilateral prostheses static stance for 5 minutes, scanning environment and reaches 2" anteriorly with supervision.    Time  6    Period  Months    Status  On-going    Target Date  09/24/19      PT LONG TERM GOAL #6   Title  Patient ambulates 45' between 2 chairs including turning 90* to position to sit with RW & prostheses with minA from family safely.    Time  6    Period  Months    Status  On-going    Target Date  09/24/19            Plan - 04/22/19 1944    Clinical Impression Statement  Session focused on overall leg strength, endurance, and standing tolerance. He is improving with sit to stand ability with UE support at sink but still has difficulty with standing fully upright.    Personal Factors and Comorbidities  Age;Comorbidity 3+;Fitness;Time  since onset of injury/illness/exacerbation    Comorbidities  Bil TTAs, DM2, PAD, HTN, CHF, A-Fib, TIA, CKDst3,    Examination-Activity Limitations  Locomotion Level;Sit;Stand;Toileting;Transfers    Examination-Participation Restrictions  Community Activity    Stability/Clinical Decision Making  Evolving/Moderate complexity    Rehab Potential  Good    PT Frequency  2x / week    PT Duration  Other (comment)   25 weeks (6 months)   PT Treatment/Interventions  ADLs/Self Care Home Management;DME Instruction;Gait training;Stair training;Functional mobility training;Therapeutic activities;Therapeutic exercise;Balance training;Neuromuscular re-education;Patient/family education;Prosthetic Training;Manual techniques;Passive range of motion;Vestibular    PT Next Visit Plan  review  prosthetic care, update HEP, sit to/from stand & standing in parallel bars, NuStep    Consulted and Agree with Plan of Care  Patient;Family member/caregiver    Family Member Consulted  son, Concepcion Kirkpatrick       Patient will benefit from skilled therapeutic intervention in order to improve the following deficits and impairments:  Abnormal gait, Cardiopulmonary status limiting activity, Decreased activity tolerance, Decreased balance, Decreased endurance, Decreased knowledge of use of DME, Decreased mobility, Decreased range of motion, Decreased scar mobility, Decreased strength, Increased edema, Impaired flexibility, Impaired UE functional use, Postural dysfunction, Prosthetic Dependency, Pain  Visit Diagnosis: Unsteadiness on feet  Other abnormalities of gait and mobility  Abnormal posture  Weakness generalized  Stiffness of left knee, not elsewhere classified  Muscle weakness (generalized)  Stiffness of right knee, not elsewhere classified     Problem List Patient Active Problem List   Diagnosis Date Noted  . Weakness generalized 04/05/2019  . S/P BKA (below knee amputation) unilateral, left ([place]) 02/15/2019  . Pressure injury of skin 02/03/2019  . TIA (transient ischemic attack) 02/03/2019  . Transient speech disturbance 02/02/2019  . Chronic indwelling Foley catheter 01/22/2019  . Postoperative wound infection 01/20/2019  . Normocytic anemia 01/18/2019  . Cellulitis 01/18/2019  . Infection of deep incisional surgical site after procedure   . Abnormal urinalysis   . Labile blood glucose   . Urinary retention   . S/P bilateral BKA (below knee amputation) ([place])   . Acute blood loss anemia   . Urinary retention due to benign prostatic hyperplasia 12/28/2018  . Unilateral complete BKA, left, initial encounter ([place]) 12/25/2018  . Dehiscence of amputation stump ([place])   . History of transmetatarsal amputation of left foot ([place]) 12/09/2018  . Critical limb ischemia with history  of revascularization of same extremity   . Gangrene of left foot ([place])   . Diabetic ulcer of toe of left foot associated with type 2 diabetes mellitus ([place])   . Cellulitis of left lower extremity   . Toe infection 11/18/2018  . Peripheral edema   . Acute on chronic diastolic CHF (congestive heart failure) ([place]) 11/09/2018  . S/P BKA (below knee amputation) unilateral, right ([place]) 10/05/2018  . Hyponatremia   . Essential hypertension   . Postoperative pain   . Pain of left heel   . Unable to maintain weight-bearing   . Type 2 diabetes mellitus with diabetic peripheral angiopathy with gangrene ([place])   . Anemia due to acute blood loss   . Chronic kidney disease, stage 3a   . Acquired absence of right leg below knee ([place]) 09/07/2018  . Gangrene of right foot ([place])   . DNR (do not resuscitate) discussion   . Goals of care, counseling/discussion   . Palliative care by specialist   . Type 2 diabetes mellitus with vascular disease ([place])   . Severe protein-calorie malnutrition (  [place])   . Ischemic foot 08/20/2018  . Critical lower limb ischemia 08/03/2018  . Type 2 diabetes mellitus with foot ulcer, without long-term current use of insulin ([place])   . Gastroesophageal reflux disease   . Cellulitis of great toe of right foot 05/29/2018  . Atrial fibrillation, chronic   . Chest pain 07/25/2017  . PAF (paroxysmal atrial fibrillation) ([place]) 07/25/2017  . BPH (benign prostatic hyperplasia) 07/25/2017  . Shortness of breath 05/11/2017  . Hypothyroidism 05/11/2017  . Chronic diastolic CHF (congestive heart failure) ([place]) 05/11/2017  . Moderate aortic stenosis 06/12/2016  . RBBB 06/12/2016  . Peripheral vascular disease ([place]) 08/04/2012  . Essential hypertension, benign 08/04/2012  . Hyperlipidemia 08/04/2012  . Dizziness 08/04/2012    Silvestre Mesi 04/22/2019, 7:46 PM  [facility] [address] [place], [place], [address] Phone: [phone]   Fax:   [phone]  Name: [name] MRN: [id number] Date of Birth: Dec 02, 1927

## 2019-04-26 ENCOUNTER — Ambulatory Visit (INDEPENDENT_AMBULATORY_CARE_PROVIDER_SITE_OTHER): Payer: Medicare Other | Admitting: Physical Therapy

## 2019-04-26 ENCOUNTER — Other Ambulatory Visit: Payer: Self-pay

## 2019-04-26 DIAGNOSIS — R2681 Unsteadiness on feet: Secondary | ICD-10-CM

## 2019-04-26 DIAGNOSIS — R2689 Other abnormalities of gait and mobility: Secondary | ICD-10-CM | POA: Diagnosis not present

## 2019-04-26 DIAGNOSIS — R293 Abnormal posture: Secondary | ICD-10-CM

## 2019-04-26 DIAGNOSIS — M25662 Stiffness of left knee, not elsewhere classified: Secondary | ICD-10-CM | POA: Diagnosis not present

## 2019-04-26 DIAGNOSIS — M25661 Stiffness of right knee, not elsewhere classified: Secondary | ICD-10-CM

## 2019-04-26 DIAGNOSIS — M6281 Muscle weakness (generalized): Secondary | ICD-10-CM

## 2019-04-26 DIAGNOSIS — R601 Generalized edema: Secondary | ICD-10-CM

## 2019-04-26 NOTE — Therapy (Signed)
[facility] [address] [place], [place], [address] Phone: [phone]   Fax:  [phone]  Physical Therapy Treatment  Patient Details  Name: [name] MRN: [id number] Date of Birth: October 30, 1927 Referring Provider (PT): [name], MD   Encounter Date: 04/26/2019  PT End of Session - 04/26/19 1214    Visit Number  8    Number of Visits  50    Date for PT Re-Evaluation  06/30/19    Authorization Type  [employer] Medicare    Authorization Time Period  $30 co-pay    PT Start Time  0930    PT Stop Time  1020    PT Time Calculation (min)  50 min    Equipment Utilized During Treatment  Gait belt    Activity Tolerance  Patient tolerated treatment well    Behavior During Therapy  [facility] for tasks assessed/performed       Past Medical History:  Diagnosis Date  . Arthritis   . Borderline diabetic   . Diabetes mellitus without complication ([place])   . Glaucoma   . Hyperlipidemia   . Hypertension   . PVD (peripheral vascular disease) ([place])   . Sleep apnea    Cpap ordered but doesnt use  . Urinary retention due to benign prostatic hyperplasia 12/28/2018    Past Surgical History:  Procedure Laterality Date  . ABDOMINAL AORTOGRAM W/LOWER EXTREMITY N/A 07/27/2018   Procedure: ABDOMINAL AORTOGRAM W/LOWER EXTREMITY;  Surgeon: Lorretta Harp, MD;  Location: [place] [facility];  Service: Cardiovascular;  Laterality: N/A;  . AMPUTATION Right 08/28/2018   Procedure: RIGHT BELOW KNEE AMPUTATION;  Surgeon: [name], MD;  Location: [place];  Service: Orthopedics;  Laterality: Right;  . AMPUTATION Left 12/02/2018   Procedure: LEFT TRANSMETATARSAL AMPUTATION AND NEGATIVE PRESSURE WOUND VAC PLACEMENT;  Surgeon: [name], MD;  Location: [place];  Service: Orthopedics;  Laterality: Left;  . AMPUTATION Left 12/18/2018   Procedure: LEFT BELOW KNEE AMPUTATION;  Surgeon: [name], MD;  Location: [place];  Service: Orthopedics;  Laterality: Left;  .  APPENDECTOMY  1943  . BELOW KNEE LEG AMPUTATION Left 12/18/2018  . CATARACT EXTRACTION  2012   x2  . COLONOSCOPY N/A 11/01/2014   Procedure: COLONOSCOPY;  Surgeon: Aviva Signs, MD;  [facility];  Service: Gastroenterology;  Laterality: N/A;  . ESOPHAGOGASTRODUODENOSCOPY N/A 11/01/2014   Procedure: ESOPHAGOGASTRODUODENOSCOPY (EGD);  Surgeon: Aviva Signs, MD;  [facility];  Service: Gastroenterology;  Laterality: N/A;  . HERNIA REPAIR Left   . INGUINAL HERNIA REPAIR Right 03/01/2013   Procedure: RIGHT INGUINAL HERNIORRHAPHY;  Surgeon: Jamesetta So, MD;  Location: [facility];  Service: General;  Laterality: Right;  . INSERTION OF MESH Right 03/01/2013   Procedure: INSERTION OF MESH;  Surgeon: Jamesetta So, MD;  Location: [facility];  Service: General;  Laterality: Right;  . LOWER EXTREMITY ANGIOGRAPHY Right 08/25/2018   Procedure: LOWER EXTREMITY ANGIOGRAPHY;  Surgeon: Wellington Hampshire, MD;  Location: [place] [facility];  Service: Cardiovascular;  Laterality: Right;  . LOWER EXTREMITY ANGIOGRAPHY N/A 11/25/2018   Procedure: LOWER EXTREMITY ANGIOGRAPHY;  Surgeon: Wellington Hampshire, MD;  Location: [place] [facility];  Service: Cardiovascular;  Laterality: N/A;  . NM MYOCAR PERF WALL MOTION  06/05/2010   no significant ischemia  . PERIPHERAL VASCULAR ATHERECTOMY Right 08/03/2018   Procedure: PERIPHERAL VASCULAR ATHERECTOMY;  Surgeon: Lorretta Harp, MD;  Location: [place] [facility];  Service: Cardiovascular;  Laterality:  Right;  [name] PERIPHERAL VASCULAR ATHERECTOMY Left 11/23/2018   Procedure: PERIPHERAL VASCULAR ATHERECTOMY;  Surgeon: Lorretta Harp, MD;  Location: [place] [facility];  Service: Cardiovascular;  Laterality: Left;  sfa with dc balloon  . PERIPHERAL VASCULAR BALLOON ANGIOPLASTY Left 11/25/2018   Procedure: PERIPHERAL VASCULAR BALLOON ANGIOPLASTY;  Surgeon: Wellington Hampshire, MD;  Location: [place] [facility];  Service: Cardiovascular;  Laterality: Left;   popliteal  . PERIPHERAL VASCULAR INTERVENTION Left 11/25/2018   Procedure: PERIPHERAL VASCULAR INTERVENTION;  Surgeon: Wellington Hampshire, MD;  Location: [place] [facility];  Service: Cardiovascular;  Laterality: Left;  tibial peroneal trunk and peroneal stents   . stent  06/14/2010   left leg  . STUMP REVISION Left 01/20/2019   Procedure: REVISION LEFT BELOW KNEE AMPUTATION;  Surgeon: [name], MD;  Location: [place];  Service: Orthopedics;  Laterality: Left;  . US ECHOCARDIOGRAPHY  01/21/2006   moderate mitral annular ca+, mild MR, AOV moderately sclerotic.    There were no vitals filed for this visit.  Subjective Assessment - 04/26/19 1040    Subjective  relays some pain in his Lt shin with ambulation today, but no pain upon arrival in his legs, just some discomfort in his wrist and with catheter.    Patient is accompained by:  Family member   son, Vegas Fritze   Pertinent History  Bil TTAs, DM2, PAD, HTN, CHF, A-Fib, TIA, CKDst3    Patient Stated Goals  walk with prostheses in home & in/out house. Son would like him to be able to toilet & get in/out bed by himself.                       [place] Adult PT Treatment/Exercise - 04/26/19 0001      Transfers   Transfers  Sit to Stand;Stand to Sit;Squat Pivot Transfers;Lateral/Scoot Transfers    Sit to Stand  3: Mod assist;With upper extremity assist;From chair/3-in-1;Other (comment)    Sit to Stand Details  Manual facilitation for weight shifting;Verbal cues for safe use of DME/AE;Verbal cues for technique    Sit to Stand Details (indicate cue type and reason)  at sink 4 reps and at bars 2 reps    Sit to Stand: Patient Percentage  50%    Stand to Sit  3: Mod assist;With upper extremity assist;To chair/3-in-1    Stand to Sit Details (indicate cue type and reason)  Verbal cues for technique;Manual facilitation for weight shifting    Squat Pivot Transfers  2: Max assist   over to nu step   Lateral/Scoot Transfers  4: Min  assist    Lateral/Scoot Transfer Details (indicate cue type and reason)  from nu step seat back to [facility]      Ambulation/Gait   Ambulation/Gait  Yes    Ambulation/Gait Assistance  2: Max assist    Ambulation/Gait Assistance Details  PT blocking stance knee, manual facilitation at pelvis for weight shift over pelvis, verbal cues on sequence and upright posture and bigger step on Lt leg.  His son provided wheelchair follow    Ambulation Distance (Feet)  15 Feet   X2   Assistive device  Parallel bars;Prostheses      Exercises   Other Exercises   Nu step for endurance L5 X 8 min UE/LE. (after HR 58 and SP02 99%)standing at sink 3 min with bilat UE support, then progressed to one UE support with other arm reaching into cabinet to grab foam cups  then putting them back 2 reps each side, then sitting rest break and one more round of reaching into cabinet                PT Short Term Goals - 04/19/19 2144      PT SHORT TERM GOAL #1   Title  Patient's son demonstrates proper donning & verbalizes proper cleaning. (All STGs Target Date: 04/30/2019)    Time  4    Period  Weeks    Status  On-going    Target Date  04/30/19      PT SHORT TERM GOAL #2   Title  Patient tolerates prostheses wear >8 hrs total / day without skin issues.    Time  4    Period  Weeks    Status  On-going    Target Date  04/30/19      PT SHORT TERM GOAL #3   Title  Sit to / from stand w/c to parallel bars pulling on bars with modA.    Time  4    Period  Weeks    Status  On-going    Target Date  04/30/19      PT SHORT TERM GOAL #4   Title  Patient tolerates standing with parallel bar support with minA for 3 minutes.    Time  4    Period  Weeks    Status  On-going    Target Date  04/30/19      PT SHORT TERM GOAL #5   Title  Patient ambulates 10' in parallel bars with modA.    Time  4    Period  Weeks    Status  On-going    Target Date  04/30/19        PT Long Term Goals - 04/19/19 2144      PT LONG  TERM GOAL #1   Title  Patient and family verbalize and demonstrate understanding of proper prosthetic care to enable safe utilization of prostheses. (All LTGs Target Date: 09/24/2019)    Time  6    Period  Months    Status  On-going    Target Date  09/24/19      PT LONG TERM GOAL #2   Title  Patient tolerates wear of bilateral prostheses >90% of awake hours without skin issues or limb pain to enable functional potential during his day.    Time  6    Period  Months    Status  On-going    Target Date  09/24/19      PT LONG TERM GOAL #3   Title  Squat pivot transfer bed to w/c or chair to w/c with prostheses modified independent.    Time  6    Period  Months    Status  On-going    Target Date  09/24/19      PT LONG TERM GOAL #4   Title  Sit to / from stand elevated w/c to walker with prostheses with supervision.    Time  6    Period  Months    Status  On-going    Target Date  09/24/19      PT LONG TERM GOAL #5   Title  Standing balance with walker support with bilateral prostheses static stance for 5 minutes, scanning environment and reaches 2" anteriorly with supervision.    Time  6    Period  Months    Status  On-going    Target Date  09/24/19  PT LONG TERM GOAL #6   Title  Patient ambulates 35' between 2 chairs including turning 90* to position to sit with RW & prostheses with minA from family safely.    Time  6    Period  Months    Status  On-going    Target Date  09/24/19            Plan - 04/26/19 1214    Clinical Impression Statement  Upon transferring him to Nu step from wheelchair he had skin abrasion/laceration to his Lt elbow from the armrest, PT used band aids to control the bleeding but it still required to be wrapped in sterile guaze then secured with tape due to the size of laceraton and he is on blood thinners. This was enough to control the bleeding on his elbow and allow him to perform session without any active bleeding. His son states he will  check and rebandage as needed and that they have bandages and dressings at home. He did however show improved standing and gait tolerance today and PT will continue to work on this as able.    Personal Factors and Comorbidities  Age;Comorbidity 3+;Fitness;Time since onset of injury/illness/exacerbation    Comorbidities  Bil TTAs, DM2, PAD, HTN, CHF, A-Fib, TIA, CKDst3,    Examination-Activity Limitations  Locomotion Level;Sit;Stand;Toileting;Transfers    Examination-Participation Restrictions  Community Activity    Stability/Clinical Decision Making  Evolving/Moderate complexity    Rehab Potential  Good    PT Frequency  2x / week    PT Duration  Other (comment)   25 weeks (6 months)   PT Treatment/Interventions  ADLs/Self Care Home Management;DME Instruction;Gait training;Stair training;Functional mobility training;Therapeutic activities;Therapeutic exercise;Balance training;Neuromuscular re-education;Patient/family education;Prosthetic Training;Manual techniques;Passive range of motion;Vestibular    PT Next Visit Plan  review prosthetic care, update HEP, sit to/from stand & standing in parallel bars, NuStep    Consulted and Agree with Plan of Care  Patient;Family member/caregiver    Family Member Consulted  son, Hien Perreira       Patient will benefit from skilled therapeutic intervention in order to improve the following deficits and impairments:  Abnormal gait, Cardiopulmonary status limiting activity, Decreased activity tolerance, Decreased balance, Decreased endurance, Decreased knowledge of use of DME, Decreased mobility, Decreased range of motion, Decreased scar mobility, Decreased strength, Increased edema, Impaired flexibility, Impaired UE functional use, Postural dysfunction, Prosthetic Dependency, Pain  Visit Diagnosis: Unsteadiness on feet  Other abnormalities of gait and mobility  Abnormal posture  Stiffness of left knee, not elsewhere classified  Muscle weakness  (generalized)  Stiffness of right knee, not elsewhere classified  Generalized edema     Problem List Patient Active Problem List   Diagnosis Date Noted  . Weakness generalized 04/05/2019  . S/P BKA (below knee amputation) unilateral, left ([place]) 02/15/2019  . Pressure injury of skin 02/03/2019  . TIA (transient ischemic attack) 02/03/2019  . Transient speech disturbance 02/02/2019  . Chronic indwelling Foley catheter 01/22/2019  . Postoperative wound infection 01/20/2019  . Normocytic anemia 01/18/2019  . Cellulitis 01/18/2019  . Infection of deep incisional surgical site after procedure   . Abnormal urinalysis   . Labile blood glucose   . Urinary retention   . S/P bilateral BKA (below knee amputation) ([place])   . Acute blood loss anemia   . Urinary retention due to benign prostatic hyperplasia 12/28/2018  . Unilateral complete BKA, left, initial encounter ([place]) 12/25/2018  . Dehiscence of amputation stump ([place])   . History of transmetatarsal amputation  of left foot ([place]) 12/09/2018  . Critical limb ischemia with history of revascularization of same extremity   . Gangrene of left foot ([place])   . Diabetic ulcer of toe of left foot associated with type 2 diabetes mellitus ([place])   . Cellulitis of left lower extremity   . Toe infection 11/18/2018  . Peripheral edema   . Acute on chronic diastolic CHF (congestive heart failure) ([place]) 11/09/2018  . S/P BKA (below knee amputation) unilateral, right ([place]) 10/05/2018  . Hyponatremia   . Essential hypertension   . Postoperative pain   . Pain of left heel   . Unable to maintain weight-bearing   . Type 2 diabetes mellitus with diabetic peripheral angiopathy with gangrene ([place])   . Anemia due to acute blood loss   . Chronic kidney disease, stage 3a   . Acquired absence of right leg below knee ([place]) 09/07/2018  . Gangrene of right foot ([place])   . DNR (do not resuscitate) discussion   . Goals of care, counseling/discussion   .  Palliative care by specialist   . Type 2 diabetes mellitus with vascular disease ([place])   . Severe protein-calorie malnutrition ([place])   . Ischemic foot 08/20/2018  . Critical lower limb ischemia 08/03/2018  . Type 2 diabetes mellitus with foot ulcer, without long-term current use of insulin ([place])   . Gastroesophageal reflux disease   . Cellulitis of great toe of right foot 05/29/2018  . Atrial fibrillation, chronic   . Chest pain 07/25/2017  . PAF (paroxysmal atrial fibrillation) ([place]) 07/25/2017  . BPH (benign prostatic hyperplasia) 07/25/2017  . Shortness of breath 05/11/2017  . Hypothyroidism 05/11/2017  . Chronic diastolic CHF (congestive heart failure) ([place]) 05/11/2017  . Moderate aortic stenosis 06/12/2016  . RBBB 06/12/2016  . Peripheral vascular disease ([place]) 08/04/2012  . Essential hypertension, benign 08/04/2012  . Hyperlipidemia 08/04/2012  . Dizziness 08/04/2012    Silvestre Mesi 04/26/2019, 12:36 PM  [facility] [address] [place], [place], [address] Phone: [phone]   Fax:  [phone]  Name: JOSHUWA VECCHIO MRN: [id number] Date of Birth: 04/06/1927

## 2019-04-28 ENCOUNTER — Ambulatory Visit (INDEPENDENT_AMBULATORY_CARE_PROVIDER_SITE_OTHER): Payer: Medicare Other | Admitting: Physical Therapy

## 2019-04-28 ENCOUNTER — Other Ambulatory Visit: Payer: Self-pay

## 2019-04-28 DIAGNOSIS — M6281 Muscle weakness (generalized): Secondary | ICD-10-CM

## 2019-04-28 DIAGNOSIS — R2681 Unsteadiness on feet: Secondary | ICD-10-CM

## 2019-04-28 DIAGNOSIS — M25662 Stiffness of left knee, not elsewhere classified: Secondary | ICD-10-CM

## 2019-04-28 DIAGNOSIS — R293 Abnormal posture: Secondary | ICD-10-CM

## 2019-04-28 DIAGNOSIS — R2689 Other abnormalities of gait and mobility: Secondary | ICD-10-CM | POA: Diagnosis not present

## 2019-04-28 DIAGNOSIS — M25661 Stiffness of right knee, not elsewhere classified: Secondary | ICD-10-CM

## 2019-04-28 DIAGNOSIS — R531 Weakness: Secondary | ICD-10-CM

## 2019-04-28 NOTE — Therapy (Signed)
[facility] Physical Therapy [address] [place], [place], [address] Phone: [phone]   Fax:  [phone]  Physical Therapy Treatment  Patient Details  Name: [name] MRN: [id number] Date of Birth: [birth year] Referring Provider (PT): [name], MD   Encounter Date: 04/28/2019  PT End of Session - 04/28/19 1100    Visit Number  9    Number of Visits  50    Date for PT Re-Evaluation  06/30/19    Authorization Type  [employer] Medicare    Authorization Time Period  $30 co-pay    Progress Note Due on Visit  10    PT Start Time  0932    PT Stop Time  1020    PT Time Calculation (min)  48 min    Equipment Utilized During Treatment  Gait belt    Activity Tolerance  Patient tolerated treatment well    Behavior During Therapy  [facility] for tasks assessed/performed       Past Medical History:  Diagnosis Date  . Arthritis   . Borderline diabetic   . Diabetes mellitus without complication ([place])   . Glaucoma   . Hyperlipidemia   . Hypertension   . PVD (peripheral vascular disease) ([place])   . Sleep apnea    Cpap ordered but doesnt use  . Urinary retention due to benign prostatic hyperplasia 12/28/2018    Past Surgical History:  Procedure Laterality Date  . ABDOMINAL AORTOGRAM W/LOWER EXTREMITY N/A 07/27/2018   Procedure: ABDOMINAL AORTOGRAM W/LOWER EXTREMITY;  Surgeon: Lorretta Harp, MD;  Location: [place] [facility];  Service: Cardiovascular;  Laterality: N/A;  . AMPUTATION Right 08/28/2018   Procedure: RIGHT BELOW KNEE AMPUTATION;  Surgeon: [name], MD;  Location: [place];  Service: Orthopedics;  Laterality: Right;  . AMPUTATION Left 12/02/2018   Procedure: LEFT TRANSMETATARSAL AMPUTATION AND NEGATIVE PRESSURE WOUND VAC PLACEMENT;  Surgeon: [name], MD;  Location: [place];  Service: Orthopedics;  Laterality: Left;  . AMPUTATION Left 12/18/2018   Procedure: LEFT BELOW KNEE AMPUTATION;  Surgeon: [name], MD;  Location: [place];  Service:  Orthopedics;  Laterality: Left;  . APPENDECTOMY  1943  . BELOW KNEE LEG AMPUTATION Left 12/18/2018  . CATARACT EXTRACTION  2012   x2  . COLONOSCOPY N/A 11/01/2014   Procedure: COLONOSCOPY;  Surgeon: Aviva Signs, MD;  [facility];  Service: Gastroenterology;  Laterality: N/A;  . ESOPHAGOGASTRODUODENOSCOPY N/A 11/01/2014   Procedure: ESOPHAGOGASTRODUODENOSCOPY (EGD);  Surgeon: Aviva Signs, MD;  [facility];  Service: Gastroenterology;  Laterality: N/A;  . HERNIA REPAIR Left   . INGUINAL HERNIA REPAIR Right 03/01/2013   Procedure: RIGHT INGUINAL HERNIORRHAPHY;  Surgeon: Jamesetta So, MD;  Location: [facility];  Service: General;  Laterality: Right;  . INSERTION OF MESH Right 03/01/2013   Procedure: INSERTION OF MESH;  Surgeon: Jamesetta So, MD;  Location: [facility];  Service: General;  Laterality: Right;  . LOWER EXTREMITY ANGIOGRAPHY Right 08/25/2018   Procedure: LOWER EXTREMITY ANGIOGRAPHY;  Surgeon: Wellington Hampshire, MD;  Location: [place] [facility];  Service: Cardiovascular;  Laterality: Right;  . LOWER EXTREMITY ANGIOGRAPHY N/A 11/25/2018   Procedure: LOWER EXTREMITY ANGIOGRAPHY;  Surgeon: Wellington Hampshire, MD;  Location: [place] [facility];  Service: Cardiovascular;  Laterality: N/A;  . NM MYOCAR PERF WALL MOTION  06/05/2010   no significant ischemia  . PERIPHERAL VASCULAR ATHERECTOMY Right 08/03/2018   Procedure: PERIPHERAL VASCULAR ATHERECTOMY;  Surgeon: Lorretta Harp, MD;  Location: [place] [facility];  Service: Cardiovascular;  Laterality: Right;  . PERIPHERAL VASCULAR ATHERECTOMY Left 11/23/2018   Procedure: PERIPHERAL VASCULAR ATHERECTOMY;  Surgeon: Lorretta Harp, MD;  Location: [place] [facility];  Service: Cardiovascular;  Laterality: Left;  sfa with dc balloon  . PERIPHERAL VASCULAR BALLOON ANGIOPLASTY Left 11/25/2018   Procedure: PERIPHERAL VASCULAR BALLOON ANGIOPLASTY;  Surgeon: Wellington Hampshire, MD;  Location: [place] [facility];  Service:  Cardiovascular;  Laterality: Left;  popliteal  . PERIPHERAL VASCULAR INTERVENTION Left 11/25/2018   Procedure: PERIPHERAL VASCULAR INTERVENTION;  Surgeon: Wellington Hampshire, MD;  Location: [place] [facility];  Service: Cardiovascular;  Laterality: Left;  tibial peroneal trunk and peroneal stents   . stent  06/14/2010   left leg  . STUMP REVISION Left 01/20/2019   Procedure: REVISION LEFT BELOW KNEE AMPUTATION;  Surgeon: [name], MD;  Location: [place];  Service: Orthopedics;  Laterality: Left;  . US ECHOCARDIOGRAPHY  01/21/2006   moderate mitral annular ca+, mild MR, AOV moderately sclerotic.    There were no vitals filed for this visit.  Subjective Assessment - 04/28/19 1030    Subjective  relays no pain in his legs upon arrival, some stinging in his lower left leg with standing, some discomfort in his wrist and with catheter.    Patient is accompained by:  Family member   son, Alixander Rallis   Pertinent History  Bil TTAs, DM2, PAD, HTN, CHF, A-Fib, TIA, CKDst3    Patient Stated Goals  walk with prostheses in home & in/out house. Son would like him to be able to toilet & get in/out bed by himself.      [place] Adult PT Treatment/Exercise - 04/28/19 0001      Transfers   Transfers  Sit to Stand;Stand to Sit;Squat Pivot Transfers;Lateral/Scoot Transfers    Sit to Stand  3: Mod assist;With upper extremity assist;From chair/3-in-1;Other (comment)   min to mod A   Sit to Stand Details  Manual facilitation for weight shifting;Verbal cues for safe use of DME/AE;Verbal cues for technique    Sit to Stand Details (indicate cue type and reason)  at sink 4 reps    Sit to Stand: Patient Percentage  60%    Stand to Sit  4: Min assist      Ambulation/Gait   Ambulation/Gait  Yes    Ambulation/Gait Assistance  2: Max assist    Ambulation/Gait Assistance Details  PT blocking stance knee, manual facilitation at pelvis for weight shift over pelvis, verbal cues on sequence and upright posture and larger  step on Rt leg    Ambulation Distance (Feet)  15 Feet    Assistive device  Parallel bars;Prostheses      Exercises   Other Exercises   Standing at sink 3 min with weight shifts latreal, and anterior-posterior. Then standing at sink with alt UE lifts touching for side of soap despensor X 10 bilat.  Sitting LAQ 5 lbs 2X10 bilat, seated marches 5 lbs X 15 bilat, seated H.S curls green X 15 bilat, seated hip add ball squeeze 5 sec X 15 reps, seated clams green X 15 reps      Knee/Hip Exercises: Stretches   Passive Hamstring Stretch  Right;Left;3 reps;30 seconds               PT Short Term Goals - 04/19/19 2144      PT SHORT TERM GOAL #1   Title  Patient's son demonstrates proper donning & verbalizes  proper cleaning. (All STGs Target Date: 04/30/2019)    Time  4    Period  Weeks    Status  On-going    Target Date  04/30/19      PT SHORT TERM GOAL #2   Title  Patient tolerates prostheses wear >8 hrs total / day without skin issues.    Time  4    Period  Weeks    Status  On-going    Target Date  04/30/19      PT SHORT TERM GOAL #3   Title  Sit to / from stand w/c to parallel bars pulling on bars with modA.    Time  4    Period  Weeks    Status  On-going    Target Date  04/30/19      PT SHORT TERM GOAL #4   Title  Patient tolerates standing with parallel bar support with minA for 3 minutes.    Time  4    Period  Weeks    Status  On-going    Target Date  04/30/19      PT SHORT TERM GOAL #5   Title  Patient ambulates 10' in parallel bars with modA.    Time  4    Period  Weeks    Status  On-going    Target Date  04/30/19        PT Long Term Goals - 04/19/19 2144      PT LONG TERM GOAL #1   Title  Patient and family verbalize and demonstrate understanding of proper prosthetic care to enable safe utilization of prostheses. (All LTGs Target Date: 09/24/2019)    Time  6    Period  Months    Status  On-going    Target Date  09/24/19      PT LONG TERM GOAL #2    Title  Patient tolerates wear of bilateral prostheses >90% of awake hours without skin issues or limb pain to enable functional potential during his day.    Time  6    Period  Months    Status  On-going    Target Date  09/24/19      PT LONG TERM GOAL #3   Title  Squat pivot transfer bed to w/c or chair to w/c with prostheses modified independent.    Time  6    Period  Months    Status  On-going    Target Date  09/24/19      PT LONG TERM GOAL #4   Title  Sit to / from stand elevated w/c to walker with prostheses with supervision.    Time  6    Period  Months    Status  On-going    Target Date  09/24/19      PT LONG TERM GOAL #5   Title  Standing balance with walker support with bilateral prostheses static stance for 5 minutes, scanning environment and reaches 2" anteriorly with supervision.    Time  6    Period  Months    Status  On-going    Target Date  09/24/19      PT LONG TERM GOAL #6   Title  Patient ambulates 20' between 2 chairs including turning 90* to position to sit with RW & prostheses with minA from family safely.    Time  6    Period  Months    Status  On-going    Target Date  09/24/19  Plan - 04/28/19 1100    Clinical Impression Statement  Still having some bleeding to his Lt elbow but his son changed dressing and this appeared to control bleeding. He showed some improvements in step length today with ambulation and showed improvments in overall knee extension ROM. He will need progress note next visit.    Personal Factors and Comorbidities  Age;Comorbidity 3+;Fitness;Time since onset of injury/illness/exacerbation    Comorbidities  Bil TTAs, DM2, PAD, HTN, CHF, A-Fib, TIA, CKDst3,    Examination-Activity Limitations  Locomotion Level;Sit;Stand;Toileting;Transfers    Examination-Participation Restrictions  Community Activity    Stability/Clinical Decision Making  Evolving/Moderate complexity    Rehab Potential  Good    PT Frequency  2x / week     PT Duration  Other (comment)   25 weeks (6 months)   PT Treatment/Interventions  ADLs/Self Care Home Management;DME Instruction;Gait training;Stair training;Functional mobility training;Therapeutic activities;Therapeutic exercise;Balance training;Neuromuscular re-education;Patient/family education;Prosthetic Training;Manual techniques;Passive range of motion;Vestibular    PT Next Visit Plan  review prosthetic care, update HEP, sit to/from stand & standing in parallel bars, NuStep    Consulted and Agree with Plan of Care  Patient;Family member/caregiver    Family Member Consulted  son, Kin Galbraith       Patient will benefit from skilled therapeutic intervention in order to improve the following deficits and impairments:  Abnormal gait, Cardiopulmonary status limiting activity, Decreased activity tolerance, Decreased balance, Decreased endurance, Decreased knowledge of use of DME, Decreased mobility, Decreased range of motion, Decreased scar mobility, Decreased strength, Increased edema, Impaired flexibility, Impaired UE functional use, Postural dysfunction, Prosthetic Dependency, Pain  Visit Diagnosis: Unsteadiness on feet  Other abnormalities of gait and mobility  Abnormal posture  Stiffness of left knee, not elsewhere classified  Muscle weakness (generalized)  Stiffness of right knee, not elsewhere classified  Weakness generalized     Problem List Patient Active Problem List   Diagnosis Date Noted  . Weakness generalized 04/05/2019  . S/P BKA (below knee amputation) unilateral, left ([place]) 02/15/2019  . Pressure injury of skin 02/03/2019  . TIA (transient ischemic attack) 02/03/2019  . Transient speech disturbance 02/02/2019  . Chronic indwelling Foley catheter 01/22/2019  . Postoperative wound infection 01/20/2019  . Normocytic anemia 01/18/2019  . Cellulitis 01/18/2019  . Infection of deep incisional surgical site after procedure   . Abnormal urinalysis   . Labile  blood glucose   . Urinary retention   . S/P bilateral BKA (below knee amputation) ([place])   . Acute blood loss anemia   . Urinary retention due to benign prostatic hyperplasia 12/28/2018  . Unilateral complete BKA, left, initial encounter ([place]) 12/25/2018  . Dehiscence of amputation stump ([place])   . History of transmetatarsal amputation of left foot ([place]) 12/09/2018  . Critical limb ischemia with history of revascularization of same extremity   . Gangrene of left foot ([place])   . Diabetic ulcer of toe of left foot associated with type 2 diabetes mellitus ([place])   . Cellulitis of left lower extremity   . Toe infection 11/18/2018  . Peripheral edema   . Acute on chronic diastolic CHF (congestive heart failure) ([place]) 11/09/2018  . S/P BKA (below knee amputation) unilateral, right ([place]) 10/05/2018  . Hyponatremia   . Essential hypertension   . Postoperative pain   . Pain of left heel   . Unable to maintain weight-bearing   . Type 2 diabetes mellitus with diabetic peripheral angiopathy with gangrene ([place])   . Anemia due to acute blood loss   .  Chronic kidney disease, stage 3a   . Acquired absence of right leg below knee ([place]) 09/07/2018  . Gangrene of right foot ([place])   . DNR (do not resuscitate) discussion   . Goals of care, counseling/discussion   . Palliative care by specialist   . Type 2 diabetes mellitus with vascular disease ([place])   . Severe protein-calorie malnutrition ([place])   . Ischemic foot 08/20/2018  . Critical lower limb ischemia 08/03/2018  . Type 2 diabetes mellitus with foot ulcer, without long-term current use of insulin ([place])   . Gastroesophageal reflux disease   . Cellulitis of great toe of right foot 05/29/2018  . Atrial fibrillation, chronic   . Chest pain 07/25/2017  . PAF (paroxysmal atrial fibrillation) ([place]) 07/25/2017  . BPH (benign prostatic hyperplasia) 07/25/2017  . Shortness of breath 05/11/2017  . Hypothyroidism 05/11/2017  . Chronic diastolic CHF  (congestive heart failure) ([place]) 05/11/2017  . Moderate aortic stenosis 06/12/2016  . RBBB 06/12/2016  . Peripheral vascular disease ([place]) 08/04/2012  . Essential hypertension, benign 08/04/2012  . Hyperlipidemia 08/04/2012  . Dizziness 08/04/2012    Silvestre Mesi 04/28/2019, 11:10 AM  [facility] Physical Therapy [address] [place], [place], [address] Phone: [phone]   Fax:  [phone]  Name: [name] MRN: [id number] Date of Birth: 12-09-1927

## 2019-05-04 ENCOUNTER — Encounter: Payer: Medicare Other | Admitting: Physical Therapy

## 2019-05-04 ENCOUNTER — Telehealth: Payer: Self-pay | Admitting: Internal Medicine

## 2019-05-04 DIAGNOSIS — G459 Transient cerebral ischemic attack, unspecified: Secondary | ICD-10-CM | POA: Diagnosis not present

## 2019-05-04 DIAGNOSIS — R609 Edema, unspecified: Secondary | ICD-10-CM | POA: Diagnosis not present

## 2019-05-04 DIAGNOSIS — E063 Autoimmune thyroiditis: Secondary | ICD-10-CM | POA: Diagnosis not present

## 2019-05-04 DIAGNOSIS — R58 Hemorrhage, not elsewhere classified: Secondary | ICD-10-CM | POA: Diagnosis not present

## 2019-05-04 DIAGNOSIS — R079 Chest pain, unspecified: Secondary | ICD-10-CM | POA: Diagnosis not present

## 2019-05-04 NOTE — Telephone Encounter (Signed)
Per phone call from [place] at [facility]--   pt was seen in the office today at [facility]-   complains of arm pain, pain under left breast, and slurred speech  I asked if pt was was told to go to the ER per the [facility] pt was told by Dr. Gerarda Fraction that he needed to go to the ER --they're not sure if he went.   Dr. Gerarda Fraction requested urgent referral to cardiology

## 2019-05-04 NOTE — Telephone Encounter (Signed)
Spoke to [place] at [place] appointment already scheduled with Dr.Hilty 4/21 at 3:30 pm.

## 2019-05-05 ENCOUNTER — Ambulatory Visit (INDEPENDENT_AMBULATORY_CARE_PROVIDER_SITE_OTHER): Payer: Medicare Other | Admitting: Internal Medicine

## 2019-05-05 ENCOUNTER — Other Ambulatory Visit: Payer: Self-pay

## 2019-05-05 ENCOUNTER — Encounter: Payer: Self-pay | Admitting: Internal Medicine

## 2019-05-05 VITALS — BP 144/61 | HR 54

## 2019-05-05 DIAGNOSIS — I4510 Unspecified right bundle-branch block: Secondary | ICD-10-CM | POA: Diagnosis not present

## 2019-05-05 DIAGNOSIS — I480 Paroxysmal atrial fibrillation: Secondary | ICD-10-CM | POA: Diagnosis not present

## 2019-05-05 DIAGNOSIS — I350 Nonrheumatic aortic (valve) stenosis: Secondary | ICD-10-CM

## 2019-05-05 DIAGNOSIS — I5033 Acute on chronic diastolic (congestive) heart failure: Secondary | ICD-10-CM | POA: Diagnosis not present

## 2019-05-05 NOTE — Patient Instructions (Signed)
Medication Instructions:  Dr. Debara Pickett advises that you take lasix 60mg  total daily (40mg  in AM and 20mg  in PM)  *If you need a refill on your cardiac medications before your next appointment, please call your pharmacy*   Follow-Up: At [facility], you and your health needs are our priority.  As part of our continuing mission to provide you with exceptional heart care, we have created designated Provider Care Teams.  These Care Teams include your primary Cardiologist (physician) and Advanced Practice Providers (APPs -  Physician Assistants and Nurse Practitioners) who all work together to provide you with the care you need, when you need it.  We recommend signing up for the patient portal called "MyChart".  Sign up information is provided on this After Visit Summary.  MyChart is used to connect with patients for Virtual Visits (Telemedicine).  Patients are able to view lab/test results, encounter notes, upcoming appointments, etc.  Non-urgent messages can be sent to your provider as well.   To learn more about what you can do with MyChart, go to [email or web address].    Your next appointment:   4 month(s) - after echo in August  The format for your next appointment:   In Person  Provider:   You may see Pixie Casino, MD or one of the following Advanced Practice Providers on your designated Care Team:    Almyra Deforest, PA-C  Fabian Sharp, PA-C or   Roby Lofts, [place]    Other Instructions  You have been referred to [facility] - Dr. Sherren Mocha - Dr. Lauree Chandler - [place], [place] - Theodosia Quay, RN

## 2019-05-05 NOTE — Progress Notes (Signed)
OFFICE NOTE  Chief Complaint:  Follow-up  Primary Care Physician: Redmond School, MD  HPI:  [name]  is an 86 year old gentleman with history of hypertension, dyslipidemia and peripheral vascular disease. He did have claudication and in May 2012 he had high-grade mid SFA stenosis. He had a stent placed in his SFA which improved his claudication and his Doppler studies. In December 2012 his left ABI was 0.61, the right ABI 0.74 with a patent stent. He was seen back by Dr. Gwenlyn Found and recommended followup with me in the office today. His only complaints now are about occasional dizziness especially with change in position, also leg pain with walking but not claudication, mostly joint pain, hip pain which could represent arthritis, wearing glasses.   I the pleasure seeing [name] back today. It's been over a year and a half since I saw him last in the office. He reports doing fairly well and denies any chest pain or worsening shortness of breath. He denies any claudication in his legs. Blood pressure is noted to be elevated somewhat today. He is previous film lisinopril and this was stopped for unknown reasons. Not on aspirin but continues to take Plavix for his PAD.  [name] returns today for follow-up. Overall he is doing fairly well though has complaints about particular right hip pain and knee pain. He also reports some swelling in his legs gets worse during the day but improves with elevating them overnight. He says he gets some numbness and tingling in his feet as well. He had arterial Dopplers last year which show stable ABIs and no evidence of stent occlusion. He is due for repeat arterial Dopplers in April of this year. He denies any chest pain or worsening shortness of breath.  06/12/2016  [name] returns today for follow-up. Recently he saw his nephrologist to auscultated a systolic heart murmur. An echocardiogram was ordered at [facility]. This demonstrated an EF  of 60-65%, there was mild to moderate aortic stenosis with a mean gradient of 15 mmHg and calculated valve area of 1.3-1.4 cm. He denies chest pain, worsening shortness of breath, presyncope, syncope or congestive heart failure symptoms. His only other complaint today is bilateral knee pain. He has a history of knee surgery and has also been having some right hip pain. Blood pressure is at goal. He is on atorvastatin 40 mg daily followed by his primary care provider. He has a history of PAD with lower extremity arterial Dopplers in April 2017 that showed no obstructive PAD and a patent stent.  05/09/2017  [name] returns today for follow-up.  He is reporting some mild shortness of breath as well as lower extremity edema.  According to records he is gained 6 pounds since we last saw him.  He reports worsening lower extremity swelling.  He was scheduled to have a routine echocardiogram on May 30 and office visit with me on May 31, however came in earlier because of worsening shortness of breath.  EKG just shows sinus rhythm with first-degree AV block at 63, RBBB-personally reviewed.  His daughter was accompanying him today and noted that he has been out of several of his medications.  Namely he has not taken tamsulosin, finasteride and levothyroxine.  He is also out of his hydrochlorothiazide diuretic.  06/13/2017  [name] returns today for follow-up.  He had an echocardiogram yesterday which showed LVEF of 65-705%, however he now has moderate aortic stenosis, increased from mild stenosis a year  ago.  He reports marked improvement in his edema although weight is about a pound heavier than it was when I last saw him a month ago.  He is on Lasix now.  Creatinine is slightly higher on the Lasix at 1.6, but may be accepted due to his improvement in edema.  08/11/2017  [name] was seen today in follow-up.  Unfortunately was hospitalized in June with chest heaviness.  He was seen by Dr. Harl Bowie in consultation  who felt that his chest pain was atypical for cardiac pain but was noted to be a new onset atrial fibrillation.  Fortunately spontaneously converted back to sinus however was appropriately switched to Eliquis from Plavix.  He denies any recurrent A. fib since then has had no further chest discomfort.  I suspect he felt unwell either due to the A. fib or as a combination of A. fib and moderate aortic stenosis.  He is scheduled ready for a repeat echocardiogram in November and follow-up with me afterwards.  12/09/2017  [name] is seen today for follow-up of her recent echo.  This was a repeat study to assess any changes in aortic stenosis.  His last study was over 6 months ago.  This demonstrates moderate aortic stenosis with a mean gradient of 26 mmHg, from 21 mmHg previously.  He is asymptomatic with this and denies any recurrent atrial fibrillation.  EKG today shows sinus rhythm.  He is compliant with Eliquis and denies any bleeding problems.  Blood pressures been well controlled at home.  01/25/2019  [name] returns today for follow-up.  Unfortunately he has had a very difficult past year.  He has now undergone bilateral BKA's with recurrent issues due to critical limb ischemia.  He is essentially wheelchair-bound although does have a prosthesis on the right side.  He was noted to have moderate aortic stenosis by echo this past August and will need repeat echo in 6 to 8 months.  Other than struggles with his legs and nonhealing wounds, he denies any chest pain or worsening shortness of breath.  05/05/2019  [name] is seen today in follow-up.  He is accompanied by his son.  He still has difficulty walking on his BKA's.  More recently he has had some worsening edema, particularly upper extremities and his thighs.  He was seen by Dr. Gerarda Fraction, who noted he was markedly bradycardic.  EKG showed possibly a junctional rhythm with PVC at a rate in the upper 40s.  In general Mr. Mcclafferty runs a heart rate in the mid  55s.  Additionally, he was thought to be in critical aortic stenosis with associated congestive heart failure.  I had just seen Mr. Mohammad in January this year and noted that his repeat echo shows moderate to severe aortic stenosis with a mean gradient of just over 30 mmHg.  PMHx:  Past Medical History:  Diagnosis Date  . Arthritis   . Borderline diabetic   . Diabetes mellitus without complication ([place])   . Glaucoma   . Hyperlipidemia   . Hypertension   . PVD (peripheral vascular disease) ([place])   . Sleep apnea    Cpap ordered but doesnt use  . Urinary retention due to benign prostatic hyperplasia 12/28/2018    Past Surgical History:  Procedure Laterality Date  . ABDOMINAL AORTOGRAM W/LOWER EXTREMITY N/A 07/27/2018   Procedure: ABDOMINAL AORTOGRAM W/LOWER EXTREMITY;  Surgeon: Lorretta Harp, MD;  Location: [place] [facility];  Service: Cardiovascular;  Laterality: N/A;  . AMPUTATION  Right 08/28/2018   Procedure: RIGHT BELOW KNEE AMPUTATION;  Surgeon: [name], MD;  Location: [place];  Service: Orthopedics;  Laterality: Right;  . AMPUTATION Left 12/02/2018   Procedure: LEFT TRANSMETATARSAL AMPUTATION AND NEGATIVE PRESSURE WOUND VAC PLACEMENT;  Surgeon: [name], MD;  Location: [place];  Service: Orthopedics;  Laterality: Left;  . AMPUTATION Left 12/18/2018   Procedure: LEFT BELOW KNEE AMPUTATION;  Surgeon: [name], MD;  Location: [place];  Service: Orthopedics;  Laterality: Left;  . APPENDECTOMY  1943  . BELOW KNEE LEG AMPUTATION Left 12/18/2018  . CATARACT EXTRACTION  2012   x2  . COLONOSCOPY N/A 11/01/2014   Procedure: COLONOSCOPY;  Surgeon: Aviva Signs, MD;  [facility];  Service: Gastroenterology;  Laterality: N/A;  . ESOPHAGOGASTRODUODENOSCOPY N/A 11/01/2014   Procedure: ESOPHAGOGASTRODUODENOSCOPY (EGD);  Surgeon: Aviva Signs, MD;  [facility];  Service: Gastroenterology;  Laterality: N/A;  . HERNIA REPAIR Left   . INGUINAL HERNIA REPAIR  Right 03/01/2013   Procedure: RIGHT INGUINAL HERNIORRHAPHY;  Surgeon: Jamesetta So, MD;  Location: [facility];  Service: General;  Laterality: Right;  . INSERTION OF MESH Right 03/01/2013   Procedure: INSERTION OF MESH;  Surgeon: Jamesetta So, MD;  [facility];  Service: General;  Laterality: Right;  . LOWER EXTREMITY ANGIOGRAPHY Right 08/25/2018   Procedure: LOWER EXTREMITY ANGIOGRAPHY;  Surgeon: Wellington Hampshire, MD;  Location: [place] [facility];  Service: Cardiovascular;  Laterality: Right;  . LOWER EXTREMITY ANGIOGRAPHY N/A 11/25/2018   Procedure: LOWER EXTREMITY ANGIOGRAPHY;  Surgeon: Wellington Hampshire, MD;  Location: [place] [facility];  Service: Cardiovascular;  Laterality: N/A;  . NM MYOCAR PERF WALL MOTION  06/05/2010   no significant ischemia  . PERIPHERAL VASCULAR ATHERECTOMY Right 08/03/2018   Procedure: PERIPHERAL VASCULAR ATHERECTOMY;  Surgeon: Lorretta Harp, MD;  Location: [place] [facility];  Service: Cardiovascular;  Laterality: Right;  . PERIPHERAL VASCULAR ATHERECTOMY Left 11/23/2018   Procedure: PERIPHERAL VASCULAR ATHERECTOMY;  Surgeon: Lorretta Harp, MD;  Location: [place] [facility];  Service: Cardiovascular;  Laterality: Left;  sfa with dc balloon  . PERIPHERAL VASCULAR BALLOON ANGIOPLASTY Left 11/25/2018   Procedure: PERIPHERAL VASCULAR BALLOON ANGIOPLASTY;  Surgeon: Wellington Hampshire, MD;  Location: [place] [facility];  Service: Cardiovascular;  Laterality: Left;  popliteal  . PERIPHERAL VASCULAR INTERVENTION Left 11/25/2018   Procedure: PERIPHERAL VASCULAR INTERVENTION;  Surgeon: Wellington Hampshire, MD;  Location: [place] [facility];  Service: Cardiovascular;  Laterality: Left;  tibial peroneal trunk and peroneal stents   . stent  06/14/2010   left leg  . STUMP REVISION Left 01/20/2019   Procedure: REVISION LEFT BELOW KNEE AMPUTATION;  Surgeon: [name], MD;  Location: [place];  Service: Orthopedics;  Laterality: Left;  . US ECHOCARDIOGRAPHY  01/21/2006    moderate mitral annular ca+, mild MR, AOV moderately sclerotic.    FAMHx:  Family History  Problem Relation Age of Onset  . Cancer Mother   . Heart attack Father   . Stroke Father   . Heart attack Brother   . Heart attack Brother     SOCHx:   reports that he quit smoking about 54 years ago. He has quit using smokeless tobacco.  His smokeless tobacco use included chew. He reports that he does not drink alcohol or use drugs.  ALLERGIES:  Allergies  Allergen Reactions  . Iodinated Diagnostic Agents     Other reaction(s): Fever  . Dye  Fdc Red [Red Dye] Hives    Tolerated red Docusate, Niferex    ROS: Pertinent items noted in HPI and remainder of comprehensive ROS otherwise negative.  HOME MEDS: Current Outpatient Medications  Medication Sig Dispense Refill  . amLODipine (NORVASC) 2.5 MG tablet Take 2.5 mg by mouth 2 (two) times daily.    [name] apixaban (ELIQUIS) 5 MG TABS tablet Take 1 tablet (5 mg total) by mouth 2 (two) times daily. 60 tablet 5  . aspirin EC 81 MG tablet Take 81 mg by mouth daily.    [name] atorvastatin (LIPITOR) 40 MG tablet TAKE 1 TABLET BY MOUTH EVERY DAY 90 tablet 1  . clopidogrel (PLAVIX) 75 MG tablet Take 1 tablet (75 mg total) by mouth daily with breakfast.    . diclofenac Sodium (VOLTAREN) 1 % GEL Apply 2 g topically 4 (four) times daily. (Patient taking differently: Apply 2 g topically 2 (two) times daily as needed (pain). ) 350 g 0  . docusate sodium (COLACE) 100 MG capsule Take 1 capsule (100 mg total) by mouth 2 (two) times daily. 60 capsule 0  . dorzolamide-timolol (COSOPT) 22.3-6.8 MG/ML ophthalmic solution Place 1 drop into both eyes 2 (two) times daily.    . famotidine (PEPCID) 20 MG tablet Take 1 tablet (20 mg total) by mouth daily. 60 tablet 0  . ferrous sulfate 325 (65 FE) MG tablet Take 325 mg by mouth 2 (two) times daily with a meal.    . finasteride (PROSCAR) 5 MG tablet Take 1 tablet (5 mg total) by mouth every evening. 30 tablet 0  . furosemide  (LASIX) 40 MG tablet Take 1 tablet (40 mg total) by mouth daily. 30 tablet 1  . glipiZIDE (GLUCOTROL) 5 MG tablet Take 5 mg by mouth 2 (two) times daily.     [name] levothyroxine (SYNTHROID) 50 MCG tablet Take 1 tablet (50 mcg total) by mouth daily before breakfast. 30 tablet 0  . levothyroxine (SYNTHROID) 75 MCG tablet Take 75 mcg by mouth daily.    . methocarbamol (ROBAXIN) 500 MG tablet Take 1 tablet (500 mg total) by mouth every 6 (six) hours as needed for muscle spasms. 30 tablet 0  . Misc Natural Products (GLUCOSAMINE CHOND MSM FORMULA PO) Take 1 tablet by mouth 2 (two) times daily.    . Omega-3 Fatty Acids (FISH OIL PO) Take 1 capsule by mouth daily.    Glory Rosebush ULTRA test strip 1 each 2 (two) times daily.    [name] oxyCODONE-acetaminophen (PERCOCET) 5-325 MG tablet Take 1 tablet by mouth every 4 (four) hours as needed for severe pain. 30 tablet 0  . polyethylene glycol (MIRALAX) 17 g packet Take 17 g by mouth daily as needed for moderate constipation. 14 each 0  . potassium chloride (KLOR-CON) 10 MEQ tablet Take 1 tablet (10 mEq total) by mouth daily. 30 tablet 1  . pyridoxine (B-6) 100 MG tablet Take 100 mg by mouth daily.    [name] sulfamethoxazole-trimethoprim (BACTRIM DS) 800-160 MG tablet Take 1 tablet by mouth 2 (two) times daily. 20 tablet 0  . tamsulosin (FLOMAX) 0.4 MG CAPS capsule Take 1 capsule (0.4 mg total) by mouth 2 (two) times daily. 60 capsule 0  . traMADol (ULTRAM) 50 MG tablet Take 1 tablet (50 mg total) by mouth every 6 (six) hours as needed. 60 tablet 0  . vitamin C (ASCORBIC ACID) 500 MG tablet Take 500 mg by mouth daily.    . vitamin E 400 UNIT capsule Take 400 Units by  mouth daily.     No current facility-administered medications for this visit.    LABS/IMAGING: No results found for this or any previous visit (from the past 48 hour(s)). No results found.  VITALS: BP (!) 144/61   Pulse (!) 54   SpO2 95%   EXAM: General appearance: alert and no distress Neck: no  carotid bruit and no JVD Lungs: clear to auscultation bilaterally Heart: regular rate and rhythm, S1, S2 normal and systolic murmur: systolic ejection 3/6, crescendo at 2nd right intercostal space Abdomen: soft, non-tender; bowel sounds normal; no masses,  no organomegaly Extremities: Bilateral BKA Pulses: Normal upper extremity pulses Skin: Skin color, texture, turgor normal. No rashes or lesions Neurologic: Grossly normal  EKG: Deferred  ASSESSMENT: 1. Acute on chronic diastolic congestive heart failure 2. Recent PAF - CHADSVASC score of 5 (on Eliquis) 3. Moderate to severe aortic stenosis (01/2019) 4. New RBBB/bifascicular block 5. PAD status post Bilateral BKA 6. Hypertension- controlled 7. Dizziness 8. Dyslipidemia 9. Asymptomatic bradycardia   PLAN: 1.   Mr. Mayhall has had increased edema and was placed on increasing dose diuretics by his primary care provider.  It was also possibly thought that bradycardia might be contributing to moderate to severe aortic stenosis.  On exam today he does have a late peaking systolic murmur however clear second heart sound.  He has had bradycardia although heart rate is improved somewhat today.  He is on no AV nodal blocking medications.  I suspect he does have conduction disease with a right bundle branch block and this is likely related to aortic annular calcification.  Ultimately, he may progress to the point for which he needs TAVR and I suspect will likely need a pacemaker with it.  I am not sure he is at that point now however I think it would be reasonable for him to discuss his options with our structural heart clinic.  Given his age and comorbidities as well as prior PAD, he is not necessarily a straightforward TAVR candidate and may not be a candidate for AVR at all.  Follow-up with me in 3 months.  Pixie Casino, MD, [facility], [place] Director of the Advanced Lipid Disorders &  Cardiovascular Risk  Reduction Clinic Diplomate of the American Board of Clinical Lipidology Attending Cardiologist  Direct Dial: [phone]  Fax: [phone]  Website:  [email or web address] 05/05/2019, 4:23 PM

## 2019-05-06 ENCOUNTER — Encounter: Payer: Self-pay | Admitting: Physical Therapy

## 2019-05-06 ENCOUNTER — Ambulatory Visit: Payer: Medicare Other | Admitting: Physical Therapy

## 2019-05-06 DIAGNOSIS — Z87440 Personal history of urinary (tract) infections: Secondary | ICD-10-CM | POA: Diagnosis not present

## 2019-05-06 DIAGNOSIS — M6249 Contracture of muscle, multiple sites: Secondary | ICD-10-CM

## 2019-05-06 DIAGNOSIS — R2681 Unsteadiness on feet: Secondary | ICD-10-CM | POA: Diagnosis not present

## 2019-05-06 DIAGNOSIS — R293 Abnormal posture: Secondary | ICD-10-CM | POA: Diagnosis not present

## 2019-05-06 DIAGNOSIS — R2689 Other abnormalities of gait and mobility: Secondary | ICD-10-CM

## 2019-05-06 DIAGNOSIS — R339 Retention of urine, unspecified: Secondary | ICD-10-CM | POA: Diagnosis not present

## 2019-05-06 DIAGNOSIS — Z9181 History of falling: Secondary | ICD-10-CM | POA: Diagnosis not present

## 2019-05-06 DIAGNOSIS — R531 Weakness: Secondary | ICD-10-CM

## 2019-05-06 DIAGNOSIS — R601 Generalized edema: Secondary | ICD-10-CM

## 2019-05-06 NOTE — Progress Notes (Signed)
[facility] Consult Note  Chief Complaint  Patient presents with  . New Patient (Initial Visit)    Severe aortic stenosis   History of Present Illness:86 yo male with history of paroxysmal atrial fibrillation on Eliquis, arthritis, DM, glaucoma, HTN, HLD, PAD, chronic diastolic CHF, sleep apnea and severe aortic stenosis who is here today as a new consult, referred by Dr. Debara Pickett, for further evaluation of his aortic valve and possible TAVR. He has a history of PAD with stent placement in the right superficial femoral artery in May 2012. He has since undergone bilateral below knee amputations on the right in August 2020 and the left in November 2020. He is mostly wheelchair bound. He has been followed for moderate aortic stenosis. Most recent echo January 2021 with LVEF=60-65%, moderate LVH. There is mild mitral regurgitation. The aortic valve leaflets are thickened and calcified with limited mobility. Mean gradient 31.5 mmHg, peak gradient 49.4 mmHg, AVA 0.76 cm2. Dimensionless index 0.27.   He is in a wheelchair. He just got his second prosthetic leg. He is in rehab. No dyspnea. Recent volume overload that has responded well to Lasix. No chest pain or dizziness. Overall doing well.   Primary Care Physician: Redmond School, MD Primary Cardiologist: [facility] Referring Cardiologist: Debara Pickett  Past Medical History:  Diagnosis Date  . Arthritis   . Borderline diabetic   . Diabetes mellitus without complication ([place])   . Glaucoma   . Hyperlipidemia   . Hypertension   . PVD (peripheral vascular disease) ([place])   . Sleep apnea    Cpap ordered but doesnt use  . Urinary retention due to benign prostatic hyperplasia 12/28/2018    Past Surgical History:  Procedure Laterality Date  . ABDOMINAL AORTOGRAM W/LOWER EXTREMITY N/A 07/27/2018   Procedure: ABDOMINAL AORTOGRAM W/LOWER EXTREMITY;  Surgeon: Lorretta Harp, MD;  Location: [facility];  Service: Cardiovascular;  Laterality:  N/A;  . AMPUTATION Right 08/28/2018   Procedure: RIGHT BELOW KNEE AMPUTATION;  Surgeon: [name], MD;  Location: [place];  Service: Orthopedics;  Laterality: Right;  . AMPUTATION Left 12/02/2018   Procedure: LEFT TRANSMETATARSAL AMPUTATION AND NEGATIVE PRESSURE WOUND VAC PLACEMENT;  Surgeon: [name], MD;  Location: [place];  Service: Orthopedics;  Laterality: Left;  . AMPUTATION Left 12/18/2018   Procedure: LEFT BELOW KNEE AMPUTATION;  Surgeon: [name], MD;  Location: [place];  Service: Orthopedics;  Laterality: Left;  . APPENDECTOMY  1943  . BELOW KNEE LEG AMPUTATION Left 12/18/2018  . CATARACT EXTRACTION  2012   x2  . COLONOSCOPY N/A 11/01/2014   Procedure: COLONOSCOPY;  Surgeon: Aviva Signs, MD;  [facility];  Service: Gastroenterology;  Laterality: N/A;  . ESOPHAGOGASTRODUODENOSCOPY N/A 11/01/2014   Procedure: ESOPHAGOGASTRODUODENOSCOPY (EGD);  Surgeon: Aviva Signs, MD;  [facility];  Service: Gastroenterology;  Laterality: N/A;  . HERNIA REPAIR Left   . INGUINAL HERNIA REPAIR Right 03/01/2013   Procedure: RIGHT INGUINAL HERNIORRHAPHY;  Surgeon: Jamesetta So, MD;  Location: [facility];  Service: General;  Laterality: Right;  . INSERTION OF MESH Right 03/01/2013   Procedure: INSERTION OF MESH;  Surgeon: Jamesetta So, MD;  [facility];  Service: General;  Laterality: Right;  . LOWER EXTREMITY ANGIOGRAPHY Right 08/25/2018   Procedure: LOWER EXTREMITY ANGIOGRAPHY;  Surgeon: Wellington Hampshire, MD;  Location: [place] [facility];  Service: Cardiovascular;  Laterality: Right;  . LOWER EXTREMITY ANGIOGRAPHY N/A 11/25/2018   Procedure: LOWER EXTREMITY ANGIOGRAPHY;  Surgeon: Wellington Hampshire, MD;  Location: [place] [facility];  Service: Cardiovascular;  Laterality: N/A;  . NM MYOCAR PERF WALL MOTION  06/05/2010   no significant ischemia  . PERIPHERAL VASCULAR ATHERECTOMY Right 08/03/2018   Procedure: PERIPHERAL VASCULAR ATHERECTOMY;  Surgeon: Lorretta Harp, MD;  Location: [facility];  Service: Cardiovascular;  Laterality: Right;  . PERIPHERAL VASCULAR ATHERECTOMY Left 11/23/2018   Procedure: PERIPHERAL VASCULAR ATHERECTOMY;  Surgeon: Lorretta Harp, MD;  Location: [facility];  Service: Cardiovascular;  Laterality: Left;  sfa with dc balloon  . PERIPHERAL VASCULAR BALLOON ANGIOPLASTY Left 11/25/2018   Procedure: PERIPHERAL VASCULAR BALLOON ANGIOPLASTY;  Surgeon: Wellington Hampshire, MD;  Location: [place] [facility];  Service: Cardiovascular;  Laterality: Left;  popliteal  . PERIPHERAL VASCULAR INTERVENTION Left 11/25/2018   Procedure: PERIPHERAL VASCULAR INTERVENTION;  Surgeon: Wellington Hampshire, MD;  Location: [place] [facility];  Service: Cardiovascular;  Laterality: Left;  tibial peroneal trunk and peroneal stents   . stent  06/14/2010   left leg  . STUMP REVISION Left 01/20/2019   Procedure: REVISION LEFT BELOW KNEE AMPUTATION;  Surgeon: [name], MD;  Location: [place];  Service: Orthopedics;  Laterality: Left;  . US ECHOCARDIOGRAPHY  01/21/2006   moderate mitral annular ca+, mild MR, AOV moderately sclerotic.    Current Outpatient Medications  Medication Sig Dispense Refill  . amLODipine (NORVASC) 2.5 MG tablet Take 2.5 mg by mouth daily.     [name] apixaban (ELIQUIS) 2.5 MG TABS tablet Take 2.5 mg by mouth 2 (two) times daily.    [name] aspirin EC 81 MG tablet Take 81 mg by mouth daily.    [name] atorvastatin (LIPITOR) 40 MG tablet TAKE 1 TABLET BY MOUTH EVERY DAY 90 tablet 1  . clopidogrel (PLAVIX) 75 MG tablet Take 1 tablet (75 mg total) by mouth daily with breakfast.    . diclofenac Sodium (VOLTAREN) 1 % GEL Apply 2 g topically 4 (four) times daily. 350 g 0  . dorzolamide-timolol (COSOPT) 22.3-6.8 MG/ML ophthalmic solution Place 1 drop into both eyes 2 (two) times daily.    . famotidine (PEPCID) 20 MG tablet Take 1 tablet (20 mg total) by mouth daily. 60 tablet 0  . ferrous sulfate 325 (65 FE) MG tablet Take 325 mg by mouth 2  (two) times daily with a meal.    . finasteride (PROSCAR) 5 MG tablet Take 1 tablet (5 mg total) by mouth every evening. 30 tablet 0  . furosemide (LASIX) 40 MG tablet Take 40mg  by mouth in the morning and 20mg  by mouth in the evening.    [name] glipiZIDE (GLUCOTROL) 5 MG tablet Take 5 mg by mouth 2 (two) times daily.     [name] levothyroxine (SYNTHROID) 75 MCG tablet Take 75 mcg by mouth daily.    . methocarbamol (ROBAXIN) 500 MG tablet Take 1 tablet (500 mg total) by mouth every 6 (six) hours as needed for muscle spasms. 30 tablet 0  . Misc Natural Products (GLUCOSAMINE CHOND MSM FORMULA PO) Take 1 tablet by mouth 2 (two) times daily.    . Omega-3 Fatty Acids (FISH OIL PO) Take 1 capsule by mouth daily.    Glory Rosebush ULTRA test strip 1 each 2 (two) times daily.    . polyethylene glycol (MIRALAX) 17 g packet Take 17 g by mouth daily as needed for moderate constipation. 14 each 0  . potassium chloride (KLOR-CON) 10 MEQ tablet Take 1 tablet (10 mEq total)  by mouth daily. 30 tablet 1  . pyridoxine (B-6) 100 MG tablet Take 100 mg by mouth daily.    . tamsulosin (FLOMAX) 0.4 MG CAPS capsule Take 1 capsule (0.4 mg total) by mouth 2 (two) times daily. 60 capsule 0  . traMADol (ULTRAM) 50 MG tablet Take 1 tablet (50 mg total) by mouth every 6 (six) hours as needed. 60 tablet 0  . vitamin C (ASCORBIC ACID) 500 MG tablet Take 500 mg by mouth daily.    . vitamin E 400 UNIT capsule Take 400 Units by mouth daily.     No current facility-administered medications for this visit.    Allergies  Allergen Reactions  . Iodinated Diagnostic Agents     Other reaction(s): Fever  . Dye Fdc Red [Red Dye] Hives    Tolerated red Docusate, Niferex    Social History   Socioeconomic History  . Marital status: Widowed    Spouse name: Not on file  . Number of children: 3  . Years of education: Not on file  . Highest education level: Not on file  Occupational History  . Occupation: Retired Psychologist, sport and exercise  Tobacco Use  .  Smoking status: Former Smoker    Quit date: 01/13/1965    Years since quitting: 54.3  . Smokeless tobacco: Former Systems developer    Types: Chew  Substance and Sexual Activity  . Alcohol use: No  . Drug use: No  . Sexual activity: Not Currently  Other Topics Concern  . Not on file  Social History Narrative    Has 2 children! Very supportive.    Social Determinants of Health   Financial Resource Strain: Low Risk   . Difficulty of Paying Living Expenses: Not hard at all  Food Insecurity: No Food Insecurity  . Worried About Charity fundraiser in the Last Year: Never true  . Ran Out of Food in the Last Year: Never true  Transportation Needs: No Transportation Needs  . Lack of Transportation (Medical): No  . Lack of Transportation (Non-Medical): No  Physical Activity: Inactive  . Days of Exercise per Week: 0 days  . Minutes of Exercise per Session: 0 min  Stress: Stress Concern Present  . Feeling of Stress : To some extent  Social Connections: Moderately Isolated  . Frequency of Communication with Friends and Family: More than three times a week  . Frequency of Social Gatherings with Friends and Family: More than three times a week  . Attends Religious Services: Never  . Active Member of Clubs or Organizations: No  . Attends Archivist Meetings: Never  . Marital Status: Widowed  Intimate Partner Violence: Not At Risk  . Fear of Current or Ex-Partner: No  . Emotionally Abused: No  . Physically Abused: No  . Sexually Abused: No    Family History  Problem Relation Age of Onset  . Cancer Mother   . Heart attack Father   . Stroke Father   . Heart attack Brother   . Heart attack Brother     Review of Systems:  As stated in the HPI and otherwise negative.   BP (!) 148/76   Pulse (!) 57   Ht 5' 10.5" (1.791 m)   Wt 209 lb 3.2 oz (94.9 kg)   SpO2 97%   BMI 29.59 kg/m   Physical Examination: General: Well developed, well nourished, NAD  HEENT: OP clear, mucus  membranes moist  SKIN: warm, dry. No rashes. Neuro: No focal deficits  Musculoskeletal: Muscle strength  5/5 all ext  Psychiatric: Mood and affect normal  Neck: No JVD, no carotid bruits, no thyromegaly, no lymphadenopathy.  Lungs:Clear bilaterally, no wheezes, rhonci, crackles Cardiovascular: Regular rate and rhythm. Loud, harsh, late peaking systolic murmur.  Abdomen:Soft. Bowel sounds present. Non-tender.  Extremities: Bilateral BKA.   EKG:  EKG is not ordered today. The ekg ordered today demonstrates   Echo January 2021: 1. Left ventricular ejection fraction, by visual estimation, is 60 to  65%. The left ventricle has normal function. There is moderately increased  left ventricular hypertrophy.  2. Elevated left ventricular end-diastolic pressure.  3. Left ventricular diastolic parameters are consistent with Grade I  diastolic dysfunction (impaired relaxation).  4. The left ventricle has no regional wall motion abnormalities.  5. Global right ventricle has normal systolic function.The right  ventricular size is mildly enlarged. No increase in right ventricular wall  thickness.  6. Left atrial size was normal.  7. Right atrial size was normal.  8. Mild mitral annular calcification.  9. The mitral valve is grossly normal. Mild mitral valve regurgitation.  10. The tricuspid valve is grossly normal.  11. The tricuspid valve is grossly normal. Tricuspid valve regurgitation  is mild.  12. The aortic valve is tricuspid. Aortic valve regurgitation is not  visualized. Moderate to severe aortic valve stenosis.  13. There is severe calcifcation of the aortic valve.  14. The pulmonic valve was not well visualized. Pulmonic valve  regurgitation is not visualized.  15. Normal pulmonary artery systolic pressure.  16. The inferior vena cava is normal in size with greater than 50%  respiratory variability, suggesting right atrial pressure of 3 mmHg.  17. The interatrial septum was  not well visualized.   FINDINGS  Left Ventricle: Left ventricular ejection fraction, by visual estimation,  is 60 to 65%. The left ventricle has normal function. The left ventricle  has no regional wall motion abnormalities. The left ventricular internal  cavity size was the left  ventricle is normal in size. There is moderately increased left  ventricular hypertrophy. Concentric left ventricular hypertrophy. Left  ventricular diastolic parameters are consistent with Grade I diastolic  dysfunction (impaired relaxation). Elevated left  ventricular end-diastolic pressure.   Right Ventricle: The right ventricular size is mildly enlarged. No  increase in right ventricular wall thickness. Global RV systolic function  is has normal systolic function. The tricuspid regurgitant velocity is  2.28 m/s, and with an assumed right atrial  pressure of 3 mmHg, the estimated right ventricular systolic pressure is  normal at 23.7 mmHg.   Left Atrium: Left atrial size was normal in size.   Right Atrium: Right atrial size was normal in size   Pericardium: There is no evidence of pericardial effusion.   Mitral Valve: The mitral valve is grossly normal. Mild mitral annular  calcification. Mild mitral valve regurgitation.   Tricuspid Valve: The tricuspid valve is grossly normal. Tricuspid valve  regurgitation is mild.   Aortic Valve: The aortic valve is tricuspid. Aortic valve regurgitation is  not visualized. Moderate to severe aortic stenosis is present. There is  severe calcifcation of the aortic valve. Aortic valve mean gradient  measures 31.5 mmHg. Aortic valve peak  gradient measures 49.4 mmHg. Aortic valve area, by VTI measures 0.76 cm.   Pulmonic Valve: The pulmonic valve was not well visualized. Pulmonic valve  regurgitation is not visualized. Pulmonic regurgitation is not visualized.   Aorta: The aortic root is normal in size and structure.   Venous: The inferior vena  cava is  normal in size with greater than 50%  respiratory variability, suggesting right atrial pressure of 3 mmHg.   IAS/Shunts: The interatrial septum was not well visualized.     LEFT VENTRICLE  PLAX 2D  LVIDd:     3.78 cm    Diastology  LVIDs:     2.49 cm    LV e' lateral:  5.55 cm/s  LV PW:     1.30 cm    LV E/e' lateral: 16.0  LV IVS:    1.41 cm    LV e' medial:  5.55 cm/s  LVOT diam:   1.90 cm    LV E/e' medial: 16.0  LV SV:     39 ml  LV SV Index:  18.80  LVOT Area:   2.84 cm    LV Volumes (MOD)  LV area d, A2C:  21.20 cm  LV area d, A4C:  23.50 cm  LV area s, A2C:  11.80 cm  LV area s, A4C:  11.20 cm  LV major d, A2C:  7.01 cm  LV major d, A4C:  7.25 cm  LV major s, A2C:  5.50 cm  LV major s, A4C:  5.60 cm  LV vol d, MOD A2C: 53.9 ml  LV vol d, MOD A4C: 62.3 ml  LV vol s, MOD A2C: 21.6 ml  LV vol s, MOD A4C: 18.8 ml  LV SV MOD A2C:   32.3 ml  LV SV MOD A4C:   62.3 ml  LV SV MOD BP:   38.5 ml   RIGHT VENTRICLE  RV S prime:   13.70 cm/s  TAPSE (M-mode): 2.4 cm   LEFT ATRIUM       Index    RIGHT ATRIUM      Index  LA diam:    3.50 cm 1.70 cm/m RA Area:   13.60 cm  LA Vol (A2C):  73.0 ml 35.54 ml/m RA Volume:  33.30 ml 16.21 ml/m  LA Vol (A4C):  63.4 ml 30.87 ml/m  LA Biplane Vol: 69.3 ml 33.74 ml/m  AORTIC VALVE  AV Area (Vmax):  0.82 cm  AV Area (Vmean):  0.79 cm  AV Area (VTI):   0.76 cm  AV Vmax:      351.50 cm/s  AV Vmean:     269.500 cm/s  AV VTI:      1.011 m  AV Peak Grad:   49.4 mmHg  AV Mean Grad:   31.5 mmHg  LVOT Vmax:     102.00 cm/s  LVOT Vmean:    75.200 cm/s  LVOT VTI:     0.271 m  LVOT/AV VTI ratio: 0.27    AORTA  Ao Root diam: 3.40 cm   MITRAL VALVE            TRICUSPID VALVE  MV Area (PHT): 2.34 cm       TR Peak grad:  20.7 mmHg  MV PHT:    93.96 msec      TR Vmax:     294.00 cm/s  MV Decel Time: 324 msec  MV E velocity: 88.80 cm/s 103 cm/s SHUNTS  MV A velocity: 99.40 cm/s 70.3 cm/s Systemic VTI: 0.27 m  MV E/A ratio: 0.89    1.5    Systemic Diam: 1.90 cm   Recent Labs: 07/23/2018: TSH 3.020 12/09/2018: B Natriuretic Peptide 249.1 02/04/2019: ALT 15; BUN 23; Creatinine, Ser 1.17; Hemoglobin 9.1; Magnesium 2.1; Platelets 376; Potassium 3.8; Sodium 131     Wt Readings  from Last 3 Encounters:  05/07/19 209 lb 3.2 oz (94.9 kg)  03/23/19 188 lb (85.3 kg)  02/23/19 188 lb (85.3 kg)     Other studies Reviewed: Additional studies/ records that were reviewed today include: echo images, office notes Review of the above records demonstrates: moderately severe AS   Assessment and Plan:   1. Severe Aortic Valve Stenosis: He has moderately severe aortic stenosis. He has had recent volume overload that has responded well to Lasix. I have personally reviewed the echo images. The aortic valve is thickened, calcified with limited leaflet mobility. He is not a candidate for surgical AVR. He could be considered for TAVR if his aortic stenosis progresses but it is not clear that he will even be a candidate for TAVR given his multiple co-morbiditiies including bilateral BKA. I have reviewed the natural history of aortic stenosis with the patient and their family members  who are present today. We have discussed the limitations of medical therapy and the poor prognosis associated with symptomatic aortic stenosis. We have reviewed potential treatment options, including palliative medical therapy, conventional surgical aortic valve replacement, and transcatheter aortic valve replacement. We discussed treatment options in the context of the patient's specific comorbid medical conditions.   He has a repeat echo planned for August 2021. I will see him back in the valve clinic after his echo.      Current medicines are reviewed at length with the patient today.  The  patient does not have concerns regarding medicines.  The following changes have been made:  no change  Labs/ tests ordered today include:  No orders of the defined types were placed in this encounter.    Disposition:   FU with me in August/September 2021 after his echo.    Signed, Lauree Chandler, MD 05/07/2019 10:32 AM    [facility] [address], [place], [place]  [address] Phone: [phone]; Fax: [phone]

## 2019-05-06 NOTE — Therapy (Signed)
[facility] [address] [place], [place], [address] Phone: [phone]   Fax:  [phone]  Physical Therapy Treatment & 10th Visit Progress Note  Patient Details  Name: [name] MRN: [id number] Date of Birth: Nov 01, 1927 Referring Provider (PT): [name], MD   Encounter Date: 05/06/2019   Progress Note Reporting Period 04/01/2019 to 05/06/2019  See note below for Objective Data and Assessment of Progress/Goals.       PT End of Session - 05/06/19 1200    Visit Number  10    Number of Visits  50    Date for PT Re-Evaluation  06/30/19    Authorization Type  [employer] Medicare    Authorization Time Period  $30 co-pay    Progress Note Due on Visit  10    PT Start Time  1015    PT Stop Time  1100    PT Time Calculation (min)  45 min    Equipment Utilized During Treatment  Gait belt    Activity Tolerance  Patient tolerated treatment well    Behavior During Therapy  WFL for tasks assessed/performed       Past Medical History:  Diagnosis Date  . Arthritis   . Borderline diabetic   . Diabetes mellitus without complication ([place])   . Glaucoma   . Hyperlipidemia   . Hypertension   . PVD (peripheral vascular disease) ([place])   . Sleep apnea    Cpap ordered but doesnt use  . Urinary retention due to benign prostatic hyperplasia 12/28/2018    Past Surgical History:  Procedure Laterality Date  . ABDOMINAL AORTOGRAM W/LOWER EXTREMITY N/A 07/27/2018   Procedure: ABDOMINAL AORTOGRAM W/LOWER EXTREMITY;  Surgeon: Lorretta Harp, MD;  Location: [place] [facility];  Service: Cardiovascular;  Laterality: N/A;  . AMPUTATION Right 08/28/2018   Procedure: RIGHT BELOW KNEE AMPUTATION;  Surgeon: [name], MD;  Location: [place];  Service: Orthopedics;  Laterality: Right;  . AMPUTATION Left 12/02/2018   Procedure: LEFT TRANSMETATARSAL AMPUTATION AND NEGATIVE PRESSURE WOUND VAC PLACEMENT;  Surgeon: [name], MD;  Location: [place];  Service:  Orthopedics;  Laterality: Left;  . AMPUTATION Left 12/18/2018   Procedure: LEFT BELOW KNEE AMPUTATION;  Surgeon: [name], MD;  Location: [place];  Service: Orthopedics;  Laterality: Left;  . APPENDECTOMY  1943  . BELOW KNEE LEG AMPUTATION Left 12/18/2018  . CATARACT EXTRACTION  2012   x2  . COLONOSCOPY N/A 11/01/2014   Procedure: COLONOSCOPY;  Surgeon: Aviva Signs, MD;  [facility];  Service: Gastroenterology;  Laterality: N/A;  . ESOPHAGOGASTRODUODENOSCOPY N/A 11/01/2014   Procedure: ESOPHAGOGASTRODUODENOSCOPY (EGD);  Surgeon: Aviva Signs, MD;  [facility];  Service: Gastroenterology;  Laterality: N/A;  . HERNIA REPAIR Left   . INGUINAL HERNIA REPAIR Right 03/01/2013   Procedure: RIGHT INGUINAL HERNIORRHAPHY;  Surgeon: Jamesetta So, MD;  Location: [facility];  Service: General;  Laterality: Right;  . INSERTION OF MESH Right 03/01/2013   Procedure: INSERTION OF MESH;  Surgeon: Jamesetta So, MD;  [facility]: [facility];  Service: General;  Laterality: Right;  . LOWER EXTREMITY ANGIOGRAPHY Right 08/25/2018   Procedure: LOWER EXTREMITY ANGIOGRAPHY;  Surgeon: Wellington Hampshire, MD;  Location: [place] [facility];  Service: Cardiovascular;  Laterality: Right;  . LOWER EXTREMITY ANGIOGRAPHY N/A 11/25/2018   Procedure: LOWER EXTREMITY ANGIOGRAPHY;  Surgeon: Wellington Hampshire, MD;  Location: [place] [facility];  Service: Cardiovascular;  Laterality: N/A;  . NM MYOCAR  PERF WALL MOTION  06/05/2010   no significant ischemia  . PERIPHERAL VASCULAR ATHERECTOMY Right 08/03/2018   Procedure: PERIPHERAL VASCULAR ATHERECTOMY;  Surgeon: Lorretta Harp, MD;  Location: [place] [facility];  Service: Cardiovascular;  Laterality: Right;  . PERIPHERAL VASCULAR ATHERECTOMY Left 11/23/2018   Procedure: PERIPHERAL VASCULAR ATHERECTOMY;  Surgeon: Lorretta Harp, MD;  Location: [place] [facility];  Service: Cardiovascular;  Laterality: Left;  sfa with dc balloon  . PERIPHERAL VASCULAR BALLOON  ANGIOPLASTY Left 11/25/2018   Procedure: PERIPHERAL VASCULAR BALLOON ANGIOPLASTY;  Surgeon: Wellington Hampshire, MD;  Location: [place] [facility];  Service: Cardiovascular;  Laterality: Left;  popliteal  . PERIPHERAL VASCULAR INTERVENTION Left 11/25/2018   Procedure: PERIPHERAL VASCULAR INTERVENTION;  Surgeon: Wellington Hampshire, MD;  Location: [place] [facility];  Service: Cardiovascular;  Laterality: Left;  tibial peroneal trunk and peroneal stents   . stent  06/14/2010   left leg  . STUMP REVISION Left 01/20/2019   Procedure: REVISION LEFT BELOW KNEE AMPUTATION;  Surgeon: [name], MD;  Location: [place];  Service: Orthopedics;  Laterality: Left;  . US ECHOCARDIOGRAPHY  01/21/2006   moderate mitral annular ca+, mild MR, AOV moderately sclerotic.    There were no vitals filed for this visit.  Subjective Assessment - 05/06/19 1015    Subjective  Last Friday his right shoulder then arm began to hurt and holding fluid.  He went to PCP on Tuesday who ordered increase Lasix 29m 3x/day first day, 2x/day 2nd/yesterday & then 463min morning 2075mn evening.    Patient is accompained by:  Family member   son, RonMaddex GarlitzPertinent History  Bil TTAs, DM2, PAD, HTN, CHF, A-Fib, TIA, CKDst3    Patient Stated Goals  walk with prostheses in home & in/out house. Son would like him to be able to toilet & get in/out bed by himself.    Currently in Pain?  No/denies                       [facility] 05[facility] 1015      Transfers   Transfers  Sit to Stand;Stand to Sit;Squat Pivot Transfers;Lateral/Scoot Transfers    Sit to Stand  3: Mod assist;With upper extremity assist;From chair/3-in-1;Other (comment);With armrests   to RW   Sit to Stand Details  Manual facilitation for weight shifting;Verbal cues for safe use of DME/AE;Verbal cues for technique;Verbal cues for sequencing    Sit to Stand: Patient Percentage  60%    Stand to Sit  4: Min assist;With upper  extremity assist;With armrests;To chair/3-in-1;Other (comment)   from RW   Stand to Sit Details (indicate cue type and reason)  Verbal cues for technique;Verbal cues for sequencing;Manual facilitation for weight shifting;Verbal cues for safe use of DME/AE    Squat Pivot Transfers  2: Max assist;With upper extremity assistance;With armrests   2 person assist for safety   Squat Pivot Transfer Details (indicate cue type and reason)  Manual, verbal & demo cues for technique. Worked on turning 90* to position to sit at end of gait.  1st turn with chair w/armrests on left side and 2nd w/c on right side. Pt able to turn better 2nd time but difficulty backing up to chair.        Ambulation/Gait   Ambulation/Gait  Yes    Ambulation/Gait Assistance  2: Max assist   2 people for safety   Ambulation/Gait Assistance Details  Verbal, manual / tactile & demo cues on pushing up tall / upright, RW movement, sequence.  See stand-pivot for turning to position to sit.     Ambulation Distance (Feet)  15 Feet   15'' straight path w/c follow, 5' X 2 + 90* turn   Assistive device  Prostheses;Rolling walker      Self-Care   Self-Care  Other Self-Care Comments    Other Self-Care Comments   possible elbow pads to protect skin / elbows. Used internet to look at options with son. Son verbalized understanding.        Exercises   Other Exercises   --      Knee/Hip Exercises: Stretches   Passive Hamstring Stretch  --      Prosthetics   Prosthetic Care Comments   PT instructed pt & son in increased edema: prosthesis is greatest amount of compression, then liner over shrinker, then double shrinker & least is single shrinker.  If retaining edema then may have to adjust morning routine - first try with less ply socks if needed, then liner/shrinker for 30-90 minutes then retry prosthesis.  If pushes fluid out of limb with prosthesis then may have to add ply socks later in day.  Son & pt verbalized understanding.     Current  prosthetic wear tolerance (days/week)   daily    Current prosthetic wear tolerance (#hours/day)   3-4 hrs 2x/day    Current prosthetic weight-bearing tolerance (hours/day)   2 minutes standing without pain in limbs.     Edema  pitting edema    Residual limb condition   no open areas, normal color & temperature,     Education Provided  Skin check;Residual limb care;Correct ply sock adjustment;Proper wear schedule/adjustment    Person(s) Educated  Patient;Child(ren)    Education Method  Explanation;Demonstration;Tactile cues;Verbal cues    Education Method  Verbalized understanding;Needs further instruction    Donning Prosthesis  +1 total assist   son needs minimal cueing, pt total assist   Doffing Prosthesis  --   son is independent,               PT Short Term Goals - 05/06/19 1251      PT SHORT TERM GOAL #1   Title  Patient's son demonstrates proper donning & verbalizes proper cleaning. (All STGs Target Date: 04/30/2019)    Baseline  MET 05/06/2019    Time  4    Period  Weeks    Status  Achieved    Target Date  04/30/19      PT SHORT TERM GOAL #2   Title  Patient tolerates prostheses wear >8 hrs total / day without skin issues.    Baseline  MET 05/06/2019    Time  4    Period  Weeks    Status  Achieved    Target Date  04/30/19      PT SHORT TERM GOAL #3   Title  Sit to / from stand w/c to parallel bars pulling on bars with modA.    Baseline  MET 04/28/2019    Time  4    Period  Weeks    Status  Achieved    Target Date  04/30/19      PT SHORT TERM GOAL #4   Title  Patient tolerates standing with parallel bar support with minA for 3 minutes.    Baseline  MET 04/28/2019    Time  4    Period  Weeks  Status  Achieved    Target Date  04/30/19      PT SHORT TERM GOAL #5   Title  Patient ambulates 10' in parallel bars with modA.    Baseline  MET 04/28/2019    Time  4    Period  Weeks    Status  Achieved    Target Date  04/30/19        PT Short Term Goals -  05/06/19 1318      PT SHORT TERM GOAL #1   Title  Patient's son verbalizes understanding of adjusting ply socks with edema changes.  (All STGs Target Date: 05/28/2019)    Time  4    Period  Weeks    Status  New    Target Date  05/28/19      PT SHORT TERM GOAL #2   Title  Patient tolerates prostheses wear >12 hrs total / day without skin issues.    Time  4    Period  Weeks    Status  Revised    Target Date  05/28/19      PT SHORT TERM GOAL #3   Title  Sit to / from stand w/c to walker with modA.    Time  4    Period  Weeks    Status  Revised    Target Date  05/28/19      PT SHORT TERM GOAL #4   Title  Standing balance with walker support 2 minutes & scans right/left, up/down with cervical motion with minA.    Time  4    Period  Weeks    Status  New    Target Date  05/28/19      PT SHORT TERM GOAL #5   Title  Patient ambulates 10' & turns 90* to position to sit with RW & prostheses with maxA (2 people for safety).    Time  4    Period  Weeks    Status  Revised    Target Date  05/28/19        PT Long Term Goals - 04/19/19 2144      PT LONG TERM GOAL #1   Title  Patient and family verbalize and demonstrate understanding of proper prosthetic care to enable safe utilization of prostheses. (All LTGs Target Date: 09/24/2019)    Time  6    Period  Months    Status  On-going    Target Date  09/24/19      PT LONG TERM GOAL #2   Title  Patient tolerates wear of bilateral prostheses >90% of awake hours without skin issues or limb pain to enable functional potential during his day.    Time  6    Period  Months    Status  On-going    Target Date  09/24/19      PT LONG TERM GOAL #3   Title  Squat pivot transfer bed to w/c or chair to w/c with prostheses modified independent.    Time  6    Period  Months    Status  On-going    Target Date  09/24/19      PT LONG TERM GOAL #4   Title  Sit to / from stand elevated w/c to walker with prostheses with supervision.    Time  6     Period  Months    Status  On-going    Target Date  09/24/19      PT LONG TERM  GOAL #5   Title  Standing balance with walker support with bilateral prostheses static stance for 5 minutes, scanning environment and reaches 2" anteriorly with supervision.    Time  6    Period  Months    Status  On-going    Target Date  09/24/19      PT LONG TERM GOAL #6   Title  Patient ambulates 85' between 2 chairs including turning 90* to position to sit with RW & prostheses with minA from family safely.    Time  6    Period  Months    Status  On-going    Target Date  09/24/19            Plan - 05/06/19 1257    Clinical Impression Statement  PT session initiated gait outside of parallel bars / with RW. He required maxA & lots of cueing. Straight path with w/c following was easiest but turning 90* to position to sit presented a challenge especially backing up to the chair.  Pt met STGs for initial 30 day period.    Personal Factors and Comorbidities  Age;Comorbidity 3+;Fitness;Time since onset of injury/illness/exacerbation    Comorbidities  Bil TTAs, DM2, PAD, HTN, CHF, A-Fib, TIA, CKDst3,    Examination-Activity Limitations  Locomotion Level;Sit;Stand;Toileting;Transfers    Examination-Participation Restrictions  Community Activity    Stability/Clinical Decision Making  Evolving/Moderate complexity    Rehab Potential  Good    PT Frequency  2x / week    PT Duration  Other (comment)   25 weeks (6 months)   PT Treatment/Interventions  ADLs/Self Care Home Management;DME Instruction;Gait training;Stair training;Functional mobility training;Therapeutic activities;Therapeutic exercise;Balance training;Neuromuscular re-education;Patient/family education;Prosthetic Training;Manual techniques;Passive range of motion;Vestibular    PT Next Visit Plan  work towards updated STGs, Nustep, standing balance & gait with RW when 2nd person available to assist    Consulted and Agree with Plan of Care   Patient;Family member/caregiver    Family Member Consulted  son, Kyser Wandel       Patient will benefit from skilled therapeutic intervention in order to improve the following deficits and impairments:  Abnormal gait, Cardiopulmonary status limiting activity, Decreased activity tolerance, Decreased balance, Decreased endurance, Decreased knowledge of use of DME, Decreased mobility, Decreased range of motion, Decreased scar mobility, Decreased strength, Increased edema, Impaired flexibility, Impaired UE functional use, Postural dysfunction, Prosthetic Dependency, Pain  Visit Diagnosis: Other abnormalities of gait and mobility  Unsteadiness on feet  Abnormal posture  Weakness generalized  History of fall  Generalized edema  Contracture of muscle, multiple sites     Problem List Patient Active Problem List   Diagnosis Date Noted  . Weakness generalized 04/05/2019  . S/P BKA (below knee amputation) unilateral, left ([place]) 02/15/2019  . Pressure injury of skin 02/03/2019  . TIA (transient ischemic attack) 02/03/2019  . Transient speech disturbance 02/02/2019  . Chronic indwelling Foley catheter 01/22/2019  . Postoperative wound infection 01/20/2019  . Normocytic anemia 01/18/2019  . Cellulitis 01/18/2019  . Infection of deep incisional surgical site after procedure   . Abnormal urinalysis   . Labile blood glucose   . Urinary retention   . S/P bilateral BKA (below knee amputation) ([place])   . Acute blood loss anemia   . Urinary retention due to benign prostatic hyperplasia 12/28/2018  . Unilateral complete BKA, left, initial encounter ([place]) 12/25/2018  . Dehiscence of amputation stump ([place])   . History of transmetatarsal amputation of left foot ([place]) 12/09/2018  . Critical limb ischemia  with history of revascularization of same extremity   . Gangrene of left foot ([place])   . Diabetic ulcer of toe of left foot associated with type 2 diabetes mellitus ([place])   . Cellulitis of left  lower extremity   . Toe infection 11/18/2018  . Peripheral edema   . Acute on chronic diastolic CHF (congestive heart failure) ([place]) 11/09/2018  . S/P BKA (below knee amputation) unilateral, right ([place]) 10/05/2018  . Hyponatremia   . Essential hypertension   . Postoperative pain   . Pain of left heel   . Unable to maintain weight-bearing   . Type 2 diabetes mellitus with diabetic peripheral angiopathy with gangrene ([place])   . Anemia due to acute blood loss   . Chronic kidney disease, stage 3a   . Acquired absence of right leg below knee ([place]) 09/07/2018  . Gangrene of right foot ([place])   . DNR (do not resuscitate) discussion   . Goals of care, counseling/discussion   . Palliative care by specialist   . Type 2 diabetes mellitus with vascular disease ([place])   . Severe protein-calorie malnutrition ([place])   . Ischemic foot 08/20/2018  . Critical lower limb ischemia 08/03/2018  . Type 2 diabetes mellitus with foot ulcer, without long-term current use of insulin ([place])   . Gastroesophageal reflux disease   . Cellulitis of great toe of right foot 05/29/2018  . Atrial fibrillation, chronic   . Chest pain 07/25/2017  . PAF (paroxysmal atrial fibrillation) ([place]) 07/25/2017  . BPH (benign prostatic hyperplasia) 07/25/2017  . Shortness of breath 05/11/2017  . Hypothyroidism 05/11/2017  . Chronic diastolic CHF (congestive heart failure) ([place]) 05/11/2017  . Moderate aortic stenosis 06/12/2016  . RBBB 06/12/2016  . Peripheral vascular disease ([place]) 08/04/2012  . Essential hypertension, benign 08/04/2012  . Hyperlipidemia 08/04/2012  . Dizziness 08/04/2012    Jamey Reas PT, DPT 05/06/2019, 1:05 PM  [facility] [address] [place], [place], [address] Phone: [phone]   Fax:  [phone]  Name: LETROY VAZGUEZ MRN: [id number] Date of Birth: [birth year]

## 2019-05-07 ENCOUNTER — Ambulatory Visit (INDEPENDENT_AMBULATORY_CARE_PROVIDER_SITE_OTHER): Payer: Medicare Other | Admitting: Cardiovascular Disease

## 2019-05-07 ENCOUNTER — Other Ambulatory Visit: Payer: Self-pay

## 2019-05-07 ENCOUNTER — Encounter: Payer: Self-pay | Admitting: Cardiovascular Disease

## 2019-05-07 VITALS — BP 148/76 | HR 57 | Ht 70.5 in | Wt 209.2 lb

## 2019-05-07 DIAGNOSIS — I350 Nonrheumatic aortic (valve) stenosis: Secondary | ICD-10-CM | POA: Diagnosis not present

## 2019-05-07 NOTE — Patient Instructions (Signed)
Medication Instructions:  No changes *If you need a refill on your cardiac medications before your next appointment, please call your pharmacy*   Lab Work: none If you have labs (blood work) drawn today and your tests are completely normal, you will receive your results only by: [name] MyChart Message (if you have MyChart) OR . A paper copy in the mail If you have any lab test that is abnormal or we need to change your treatment, we will call you to review the results.   Testing/Procedures: none   Follow-Up: At [facility], you and your health needs are our priority.  As part of our continuing mission to provide you with exceptional heart care, we have created designated Provider Care Teams.  These Care Teams include your primary Cardiologist (physician) and Advanced Practice Providers (APPs -  Physician Assistants and Nurse Practitioners) who all work together to provide you with the care you need, when you need it.   Your next appointment:   5 month(s)  The format for your next appointment:   In Person  Provider:   Lauree Chandler, MD   Other Instructions

## 2019-05-10 ENCOUNTER — Other Ambulatory Visit: Payer: Self-pay | Admitting: *Deleted

## 2019-05-10 ENCOUNTER — Encounter: Payer: Medicare Other | Admitting: Physical Therapy

## 2019-05-10 NOTE — Patient Outreach (Signed)
[facility]) Care Management  05/10/2019  ALICIA ACKERT [birth year] [id number]   Telephone assessment. Spoke with pt's son (pt is very HOH).  Pt has had a cardiac work up which recognized moderately severe aortic valve stenosis. He has been retaining fluids. He had an increase in his furosemide dose for 2 days and now has an increased routine dose 40mg  in am and 20 mg in pm. His edema was decreased back to normal or minimal edema. Discussions were had in relation to possible surgical interventions but for now they will just monitor for the next 6 months.  Mr Pinheiro is doing very well with his outpt rehabilitation. The PTs are very positive about his progress but he still has a way to go to be able to ambulate independently on his bilateral BK prosthetics.  [name] said that when they began outpt rehab the nurse visit stopped along with the in home PT and his foley cath has not been changed in 6-7 weeks.  Advised son to call Dr. Gerarda Fraction to get new referral for this service and advised he may have to talk with someone at [facility]. His father cannot get up on a table for an in office cath change. He is usually in bed at home for this.  Encouraged [name] to call me whenever his father has a problem that he is not able to resolve. Praised him for his dedication to his father.  We agreed that I will call now in 3 months (July).  Eulah Pont. Myrtie Neither, MSN, [facility] Gerontological Nurse Practitioner [facility] Care Management [phone]

## 2019-05-11 ENCOUNTER — Other Ambulatory Visit: Payer: Self-pay

## 2019-05-11 ENCOUNTER — Other Ambulatory Visit: Payer: Self-pay | Admitting: *Deleted

## 2019-05-11 ENCOUNTER — Ambulatory Visit: Payer: Medicare Other | Admitting: Physical Therapy

## 2019-05-11 DIAGNOSIS — Z9181 History of falling: Secondary | ICD-10-CM

## 2019-05-11 DIAGNOSIS — R600 Localized edema: Secondary | ICD-10-CM

## 2019-05-11 DIAGNOSIS — R293 Abnormal posture: Secondary | ICD-10-CM | POA: Diagnosis not present

## 2019-05-11 DIAGNOSIS — M6249 Contracture of muscle, multiple sites: Secondary | ICD-10-CM

## 2019-05-11 DIAGNOSIS — R2681 Unsteadiness on feet: Secondary | ICD-10-CM | POA: Diagnosis not present

## 2019-05-11 DIAGNOSIS — R2689 Other abnormalities of gait and mobility: Secondary | ICD-10-CM

## 2019-05-11 DIAGNOSIS — M25662 Stiffness of left knee, not elsewhere classified: Secondary | ICD-10-CM

## 2019-05-11 DIAGNOSIS — M25661 Stiffness of right knee, not elsewhere classified: Secondary | ICD-10-CM

## 2019-05-11 DIAGNOSIS — R531 Weakness: Secondary | ICD-10-CM

## 2019-05-12 NOTE — Therapy (Addendum)
[facility] Physical Therapy [address] [place], [place], [address] Phone: [phone]   Fax:  [phone]  Physical Therapy Treatment  Patient Details  Name: [name] MRN: [id number] Date of Birth: 1927-01-17 Referring Provider (PT): [name], MD   Encounter Date: 05/11/2019  PT End of Session - 05/11/19 2016    Visit Number  11    Number of Visits  50    Date for PT Re-Evaluation  06/30/19    Authorization Type  [employer] Medicare    Authorization Time Period  $30 co-pay    Progress Note Due on Visit  20    PT Start Time  1100    PT Stop Time  1145    PT Time Calculation (min)  45 min    Equipment Utilized During Treatment  Gait belt    Activity Tolerance  Patient tolerated treatment well    Behavior During Therapy  [facility] for tasks assessed/performed       Past Medical History:  Diagnosis Date  . Arthritis   . Borderline diabetic   . Diabetes mellitus without complication ([place])   . Glaucoma   . Hyperlipidemia   . Hypertension   . PVD (peripheral vascular disease) ([place])   . Sleep apnea    Cpap ordered but doesnt use  . Urinary retention due to benign prostatic hyperplasia 12/28/2018    Past Surgical History:  Procedure Laterality Date  . ABDOMINAL AORTOGRAM W/LOWER EXTREMITY N/A 07/27/2018   Procedure: ABDOMINAL AORTOGRAM W/LOWER EXTREMITY;  Surgeon: Lorretta Harp, MD;  Location: [place] [facility];  Service: Cardiovascular;  Laterality: N/A;  . AMPUTATION Right 08/28/2018   Procedure: RIGHT BELOW KNEE AMPUTATION;  Surgeon: [name], MD;  Location: [place];  Service: Orthopedics;  Laterality: Right;  . AMPUTATION Left 12/02/2018   Procedure: LEFT TRANSMETATARSAL AMPUTATION AND NEGATIVE PRESSURE WOUND VAC PLACEMENT;  Surgeon: [name], MD;  Location: [place];  Service: Orthopedics;  Laterality: Left;  . AMPUTATION Left 12/18/2018   Procedure: LEFT BELOW KNEE AMPUTATION;  Surgeon: [name], MD;  Location: [place];  Service:  Orthopedics;  Laterality: Left;  . APPENDECTOMY  1943  . BELOW KNEE LEG AMPUTATION Left 12/18/2018  . CATARACT EXTRACTION  2012   x2  . COLONOSCOPY N/A 11/01/2014   Procedure: COLONOSCOPY;  Surgeon: Aviva Signs, MD;  [facility];  Service: Gastroenterology;  Laterality: N/A;  . ESOPHAGOGASTRODUODENOSCOPY N/A 11/01/2014   Procedure: ESOPHAGOGASTRODUODENOSCOPY (EGD);  Surgeon: Aviva Signs, MD;  [facility];  Service: Gastroenterology;  Laterality: N/A;  . HERNIA REPAIR Left   . INGUINAL HERNIA REPAIR Right 03/01/2013   Procedure: RIGHT INGUINAL HERNIORRHAPHY;  Surgeon: Jamesetta So, MD;  Location: [facility];  Service: General;  Laterality: Right;  . INSERTION OF MESH Right 03/01/2013   Procedure: INSERTION OF MESH;  Surgeon: Jamesetta So, MD;  Location: [facility];  Service: General;  Laterality: Right;  . LOWER EXTREMITY ANGIOGRAPHY Right 08/25/2018   Procedure: LOWER EXTREMITY ANGIOGRAPHY;  Surgeon: Wellington Hampshire, MD;  Location: [place] [facility];  Service: Cardiovascular;  Laterality: Right;  . LOWER EXTREMITY ANGIOGRAPHY N/A 11/25/2018   Procedure: LOWER EXTREMITY ANGIOGRAPHY;  Surgeon: Wellington Hampshire, MD;  Location: [place] [facility];  Service: Cardiovascular;  Laterality: N/A;  . NM MYOCAR PERF WALL MOTION  06/05/2010   no significant ischemia  . PERIPHERAL VASCULAR ATHERECTOMY Right 08/03/2018   Procedure: PERIPHERAL VASCULAR ATHERECTOMY;  Surgeon: Lorretta Harp, MD;  Location: [place] [facility];  Service: Cardiovascular;  Laterality: Right;  . PERIPHERAL VASCULAR ATHERECTOMY Left 11/23/2018   Procedure: PERIPHERAL VASCULAR ATHERECTOMY;  Surgeon: Lorretta Harp, MD;  Location: [place] [facility];  Service: Cardiovascular;  Laterality: Left;  sfa with dc balloon  . PERIPHERAL VASCULAR BALLOON ANGIOPLASTY Left 11/25/2018   Procedure: PERIPHERAL VASCULAR BALLOON ANGIOPLASTY;  Surgeon: Wellington Hampshire, MD;  Location: [place] [facility];  Service:  Cardiovascular;  Laterality: Left;  popliteal  . PERIPHERAL VASCULAR INTERVENTION Left 11/25/2018   Procedure: PERIPHERAL VASCULAR INTERVENTION;  Surgeon: Wellington Hampshire, MD;  Location: [place] [facility];  Service: Cardiovascular;  Laterality: Left;  tibial peroneal trunk and peroneal stents   . stent  06/14/2010   left leg  . STUMP REVISION Left 01/20/2019   Procedure: REVISION LEFT BELOW KNEE AMPUTATION;  Surgeon: [name], MD;  Location: [place];  Service: Orthopedics;  Laterality: Left;  . US ECHOCARDIOGRAPHY  01/21/2006   moderate mitral annular ca+, mild MR, AOV moderately sclerotic.    There were no vitals filed for this visit.  Sit to stand w/c to RW with manual & verbal cues on technique with weight shift forward over feet and upright posture with maxA. Stand-pivot transfer with RW: manual & verbal cues on sequencing, wt shift between feet and upright posture with maxA. Difficulty stepping back to chair.  Pt ambulated 10' X 2 & 5' X 1 with RW with 2 person max A with manual cues for posture, wt over feet with shift over stance limb and RW control.                             PT Short Term Goals - 05/06/19 1318      PT SHORT TERM GOAL #1   Title  Patient's son verbalizes understanding of adjusting ply socks with edema changes.  (All STGs Target Date: 05/28/2019)    Time  4    Period  Weeks    Status  New    Target Date  05/28/19      PT SHORT TERM GOAL #2   Title  Patient tolerates prostheses wear >12 hrs total / day without skin issues.    Time  4    Period  Weeks    Status  Revised    Target Date  05/28/19      PT SHORT TERM GOAL #3   Title  Sit to / from stand w/c to walker with modA.    Time  4    Period  Weeks    Status  Revised    Target Date  05/28/19      PT SHORT TERM GOAL #4   Title  Standing balance with walker support 2 minutes & scans right/left, up/down with cervical motion with minA.    Time  4    Period  Weeks     Status  New    Target Date  05/28/19      PT SHORT TERM GOAL #5   Title  Patient ambulates 10' & turns 90* to position to sit with RW & prostheses with maxA (2 people for safety).    Time  4    Period  Weeks    Status  Revised    Target Date  05/28/19        PT Long Term Goals - 04/19/19 2144      PT LONG TERM GOAL #  1   Title  Patient and family verbalize and demonstrate understanding of proper prosthetic care to enable safe utilization of prostheses. (All LTGs Target Date: 09/24/2019)    Time  6    Period  Months    Status  On-going    Target Date  09/24/19      PT LONG TERM GOAL #2   Title  Patient tolerates wear of bilateral prostheses >90% of awake hours without skin issues or limb pain to enable functional potential during his day.    Time  6    Period  Months    Status  On-going    Target Date  09/24/19      PT LONG TERM GOAL #3   Title  Squat pivot transfer bed to w/c or chair to w/c with prostheses modified independent.    Time  6    Period  Months    Status  On-going    Target Date  09/24/19      PT LONG TERM GOAL #4   Title  Sit to / from stand elevated w/c to walker with prostheses with supervision.    Time  6    Period  Months    Status  On-going    Target Date  09/24/19      PT LONG TERM GOAL #5   Title  Standing balance with walker support with bilateral prostheses static stance for 5 minutes, scanning environment and reaches 2" anteriorly with supervision.    Time  6    Period  Months    Status  On-going    Target Date  09/24/19      PT LONG TERM GOAL #6   Title  Patient ambulates 66' between 2 chairs including turning 90* to position to sit with RW & prostheses with minA from family safely.    Time  6    Period  Months    Status  On-going    Target Date  09/24/19            Plan - 05/11/19 2018    Clinical Impression Statement  PT session worked on sit/stand transfers, stand pivot with RW, stepping backwards & prosthetic gait with RW.   He requires constant cueing & assistance of 2 people for safety.    Personal Factors and Comorbidities  Age;Comorbidity 3+;Fitness;Time since onset of injury/illness/exacerbation    Comorbidities  Bil TTAs, DM2, PAD, HTN, CHF, A-Fib, TIA, CKDst3,    Examination-Activity Limitations  Locomotion Level;Sit;Stand;Toileting;Transfers    Examination-Participation Restrictions  Community Activity    Stability/Clinical Decision Making  Evolving/Moderate complexity    Rehab Potential  Good    PT Frequency  2x / week    PT Duration  Other (comment)   25 weeks (6 months)   PT Treatment/Interventions  ADLs/Self Care Home Management;DME Instruction;Gait training;Stair training;Functional mobility training;Therapeutic activities;Therapeutic exercise;Balance training;Neuromuscular re-education;Patient/family education;Prosthetic Training;Manual techniques;Passive range of motion;Vestibular    PT Next Visit Plan  work towards updated STGs, Nustep, standing balance & gait with RW when 2nd person available to assist    Consulted and Agree with Plan of Care  Patient;Family member/caregiver    Family Member Consulted  son, Ran Tullis       Patient will benefit from skilled therapeutic intervention in order to improve the following deficits and impairments:  Abnormal gait, Cardiopulmonary status limiting activity, Decreased activity tolerance, Decreased balance, Decreased endurance, Decreased knowledge of use of DME, Decreased mobility, Decreased range of motion, Decreased scar mobility, Decreased strength,  Increased edema, Impaired flexibility, Impaired UE functional use, Postural dysfunction, Prosthetic Dependency, Pain  Visit Diagnosis: Other abnormalities of gait and mobility  Unsteadiness on feet  Abnormal posture  Weakness generalized  History of fall  Contracture of muscle, multiple sites  Stiffness of left knee, not elsewhere classified  Stiffness of right knee, not elsewhere  classified  Localized edema     Problem List Patient Active Problem List   Diagnosis Date Noted  . Weakness generalized 04/05/2019  . S/P BKA (below knee amputation) unilateral, left ([place]) 02/15/2019  . Pressure injury of skin 02/03/2019  . TIA (transient ischemic attack) 02/03/2019  . Transient speech disturbance 02/02/2019  . Chronic indwelling Foley catheter 01/22/2019  . Postoperative wound infection 01/20/2019  . Normocytic anemia 01/18/2019  . Cellulitis 01/18/2019  . Infection of deep incisional surgical site after procedure   . Abnormal urinalysis   . Labile blood glucose   . Urinary retention   . S/P bilateral BKA (below knee amputation) ([place])   . Acute blood loss anemia   . Urinary retention due to benign prostatic hyperplasia 12/28/2018  . Unilateral complete BKA, left, initial encounter ([place]) 12/25/2018  . Dehiscence of amputation stump ([place])   . History of transmetatarsal amputation of left foot ([place]) 12/09/2018  . Critical limb ischemia with history of revascularization of same extremity   . Gangrene of left foot ([place])   . Diabetic ulcer of toe of left foot associated with type 2 diabetes mellitus ([place])   . Cellulitis of left lower extremity   . Toe infection 11/18/2018  . Peripheral edema   . Acute on chronic diastolic CHF (congestive heart failure) ([place]) 11/09/2018  . S/P BKA (below knee amputation) unilateral, right ([place]) 10/05/2018  . Hyponatremia   . Essential hypertension   . Postoperative pain   . Pain of left heel   . Unable to maintain weight-bearing   . Type 2 diabetes mellitus with diabetic peripheral angiopathy with gangrene ([place])   . Anemia due to acute blood loss   . Chronic kidney disease, stage 3a   . Acquired absence of right leg below knee ([place]) 09/07/2018  . Gangrene of right foot ([place])   . DNR (do not resuscitate) discussion   . Goals of care, counseling/discussion   . Palliative care by specialist   . Type 2 diabetes mellitus with  vascular disease ([place])   . Severe protein-calorie malnutrition ([place])   . Ischemic foot 08/20/2018  . Critical lower limb ischemia 08/03/2018  . Type 2 diabetes mellitus with foot ulcer, without long-term current use of insulin ([place])   . Gastroesophageal reflux disease   . Cellulitis of great toe of right foot 05/29/2018  . Atrial fibrillation, chronic   . Chest pain 07/25/2017  . PAF (paroxysmal atrial fibrillation) ([place]) 07/25/2017  . BPH (benign prostatic hyperplasia) 07/25/2017  . Shortness of breath 05/11/2017  . Hypothyroidism 05/11/2017  . Chronic diastolic CHF (congestive heart failure) ([place]) 05/11/2017  . Moderate aortic stenosis 06/12/2016  . RBBB 06/12/2016  . Peripheral vascular disease ([place]) 08/04/2012  . Essential hypertension, benign 08/04/2012  . Hyperlipidemia 08/04/2012  . Dizziness 08/04/2012    Jamey Reas PT, DPT 05/12/2019, 12:22 AM  [facility] Physical Therapy [address] [place], [place], [address] Phone: [phone]   Fax:  [phone]  Name: [name] MRN: [id number] Date of Birth: 1927-07-15

## 2019-05-13 ENCOUNTER — Other Ambulatory Visit: Payer: Self-pay

## 2019-05-13 ENCOUNTER — Ambulatory Visit (INDEPENDENT_AMBULATORY_CARE_PROVIDER_SITE_OTHER): Payer: Medicare Other | Admitting: Physical Therapy

## 2019-05-13 DIAGNOSIS — Z9181 History of falling: Secondary | ICD-10-CM | POA: Diagnosis not present

## 2019-05-13 DIAGNOSIS — R2689 Other abnormalities of gait and mobility: Secondary | ICD-10-CM

## 2019-05-13 DIAGNOSIS — R2681 Unsteadiness on feet: Secondary | ICD-10-CM

## 2019-05-13 DIAGNOSIS — R531 Weakness: Secondary | ICD-10-CM | POA: Diagnosis not present

## 2019-05-13 DIAGNOSIS — R293 Abnormal posture: Secondary | ICD-10-CM

## 2019-05-13 DIAGNOSIS — M25661 Stiffness of right knee, not elsewhere classified: Secondary | ICD-10-CM

## 2019-05-13 DIAGNOSIS — M6249 Contracture of muscle, multiple sites: Secondary | ICD-10-CM

## 2019-05-13 DIAGNOSIS — R600 Localized edema: Secondary | ICD-10-CM

## 2019-05-13 DIAGNOSIS — M25662 Stiffness of left knee, not elsewhere classified: Secondary | ICD-10-CM

## 2019-05-13 NOTE — Therapy (Signed)
[facility] [address] [place], [place], [address] Phone: [phone]   Fax:  [phone]  Physical Therapy Treatment  Patient Details  Name: [name] MRN: [id number] Date of Birth: May 23, 1927 Referring Provider (PT): [name], MD   Encounter Date: 05/13/2019  PT End of Session - 05/13/19 1200    Visit Number  12    Number of Visits  50    Date for PT Re-Evaluation  06/30/19    Authorization Type  [employer] Medicare    Authorization Time Period  $30 co-pay    Progress Note Due on Visit  20    PT Start Time  1055    PT Stop Time  1150    PT Time Calculation (min)  55 min    Equipment Utilized During Treatment  Gait belt    Activity Tolerance  Patient tolerated treatment well    Behavior During Therapy  WFL for tasks assessed/performed       Past Medical History:  Diagnosis Date  . Arthritis   . Borderline diabetic   . Diabetes mellitus without complication ([place])   . Glaucoma   . Hyperlipidemia   . Hypertension   . PVD (peripheral vascular disease) ([place])   . Sleep apnea    Cpap ordered but doesnt use  . Urinary retention due to benign prostatic hyperplasia 12/28/2018    Past Surgical History:  Procedure Laterality Date  . ABDOMINAL AORTOGRAM W/LOWER EXTREMITY N/A 07/27/2018   Procedure: ABDOMINAL AORTOGRAM W/LOWER EXTREMITY;  Surgeon: Lorretta Harp, MD;  Location: [place] [facility];  Service: Cardiovascular;  Laterality: N/A;  . AMPUTATION Right 08/28/2018   Procedure: RIGHT BELOW KNEE AMPUTATION;  Surgeon: [name], MD;  Location: [place];  Service: Orthopedics;  Laterality: Right;  . AMPUTATION Left 12/02/2018   Procedure: LEFT TRANSMETATARSAL AMPUTATION AND NEGATIVE PRESSURE WOUND VAC PLACEMENT;  Surgeon: [name], MD;  Location: [place];  Service: Orthopedics;  Laterality: Left;  . AMPUTATION Left 12/18/2018   Procedure: LEFT BELOW KNEE AMPUTATION;  Surgeon: [name], MD;  Location: [place];  Service:  Orthopedics;  Laterality: Left;  . APPENDECTOMY  1943  . BELOW KNEE LEG AMPUTATION Left 12/18/2018  . CATARACT EXTRACTION  2012   x2  . COLONOSCOPY N/A 11/01/2014   Procedure: COLONOSCOPY;  Surgeon: Aviva Signs, MD;  [facility];  Service: Gastroenterology;  Laterality: N/A;  . ESOPHAGOGASTRODUODENOSCOPY N/A 11/01/2014   Procedure: ESOPHAGOGASTRODUODENOSCOPY (EGD);  Surgeon: Aviva Signs, MD;  [facility];  Service: Gastroenterology;  Laterality: N/A;  . HERNIA REPAIR Left   . INGUINAL HERNIA REPAIR Right 03/01/2013   Procedure: RIGHT INGUINAL HERNIORRHAPHY;  Surgeon: Jamesetta So, MD;  Location: [facility];  Service: General;  Laterality: Right;  . INSERTION OF MESH Right 03/01/2013   Procedure: INSERTION OF MESH;  Surgeon: Jamesetta So, MD;  Location: [facility];  Service: General;  Laterality: Right;  . LOWER EXTREMITY ANGIOGRAPHY Right 08/25/2018   Procedure: LOWER EXTREMITY ANGIOGRAPHY;  Surgeon: [place] Hampshire, MD;  Location: [place] [facility];  Service: Cardiovascular;  Laterality: Right;  . LOWER EXTREMITY ANGIOGRAPHY N/A 11/25/2018   Procedure: LOWER EXTREMITY ANGIOGRAPHY;  Surgeon: [place] Hampshire, MD;  Location: [place] [facility];  Service: Cardiovascular;  Laterality: N/A;  . NM MYOCAR PERF WALL MOTION  06/05/2010   no significant ischemia  . PERIPHERAL VASCULAR ATHERECTOMY Right 08/03/2018   Procedure: PERIPHERAL VASCULAR ATHERECTOMY;  Surgeon: Lorretta Harp, MD;  Location: [place] [facility];  Service: Cardiovascular;  Laterality: Right;  . PERIPHERAL VASCULAR ATHERECTOMY Left 11/23/2018   Procedure: PERIPHERAL VASCULAR ATHERECTOMY;  Surgeon: Lorretta Harp, MD;  Location: [place] [facility];  Service: Cardiovascular;  Laterality: Left;  sfa with dc balloon  . PERIPHERAL VASCULAR BALLOON ANGIOPLASTY Left 11/25/2018   Procedure: PERIPHERAL VASCULAR BALLOON ANGIOPLASTY;  Surgeon: Wellington Hampshire, MD;  Location: [place] [facility];  Service:  Cardiovascular;  Laterality: Left;  popliteal  . PERIPHERAL VASCULAR INTERVENTION Left 11/25/2018   Procedure: PERIPHERAL VASCULAR INTERVENTION;  Surgeon: Wellington Hampshire, MD;  Location: [place] [facility];  Service: Cardiovascular;  Laterality: Left;  tibial peroneal trunk and peroneal stents   . stent  06/14/2010   left leg  . STUMP REVISION Left 01/20/2019   Procedure: REVISION LEFT BELOW KNEE AMPUTATION;  Surgeon: [name], MD;  Location: [place];  Service: Orthopedics;  Laterality: Left;  . US ECHOCARDIOGRAPHY  01/21/2006   moderate mitral annular ca+, mild MR, AOV moderately sclerotic.    There were no vitals filed for this visit.  Subjective Assessment - 05/13/19 1153    Subjective  Relays feeling better today, denies pain upon arrival, he is wearing elbow pads that his son got him.    Patient is accompained by:  Family member   son, Mesiah Manzo   Pertinent History  Bil TTAs, DM2, PAD, HTN, CHF, A-Fib, TIA, CKDst3    Patient Stated Goals  walk with prostheses in home & in/out house. Son would like him to be able to toilet & get in/out bed by himself.         [facility] PT Assessment - 05/13/19 0001      Transfers   Transfers  Sit to Stand;Stand to Sit;Squat Pivot Transfers;Lateral/Scoot Transfers    Sit to Stand Details  Manual facilitation for weight shifting;Verbal cues for safe use of DME/AE;Verbal cues for technique;Verbal cues for sequencing    Sit to Stand Details (indicate cue type and reason)  mod A using RW, min A at coutner/sink overall but did progress to one rep of min guard    Sit to Stand: Patient Percentage  60%    Stand to Sit  4: Min assist;With upper extremity assist;With armrests;To chair/3-in-1;Other (comment)    Stand to Sit Details (indicate cue type and reason)  Verbal cues for technique;Verbal cues for sequencing;Manual facilitation for weight shifting;Verbal cues for safe use of DME/AE    Squat Pivot Transfers  2: Max assist;With upper extremity  assistance;With armrests    Squat Pivot Transfer Details (indicate cue type and reason)  verbal cues for pivot and back up      Ambulation/Gait   Ambulation/Gait  Yes    Ambulation/Gait Assistance  2: Max assist    Ambulation/Gait Assistance Details  verbal cues for larger step length and weight shift.     Ambulation Distance (Feet)  20 Feet   10 feet first attempt, then after rest break and cues 20 ft   Assistive device  Prostheses;Rolling walker    Gait Comments  close chair follow from son                   [facility] Adult PT Treatment/Exercise - 05/13/19 0001      Exercises   Other Exercises   Standing at sink 4 min with altating UE flexion and reach for cup stacking into cabinent. Sit to stands at sink X 5 reps total. Sitting LAQ 6 lbs  2X15 bilat      Knee/Hip Exercises: Aerobic   Nustep  Level 5 BUEs & BLEs 6 min               PT Short Term Goals - 05/06/19 1318      PT SHORT TERM GOAL #1   Title  Patient's son verbalizes understanding of adjusting ply socks with edema changes.  (All STGs Target Date: 05/28/2019)    Time  4    Period  Weeks    Status  New    Target Date  05/28/19      PT SHORT TERM GOAL #2   Title  Patient tolerates prostheses wear >12 hrs total / day without skin issues.    Time  4    Period  Weeks    Status  Revised    Target Date  05/28/19      PT SHORT TERM GOAL #3   Title  Sit to / from stand w/c to walker with modA.    Time  4    Period  Weeks    Status  Revised    Target Date  05/28/19      PT SHORT TERM GOAL #4   Title  Standing balance with walker support 2 minutes & scans right/left, up/down with cervical motion with minA.    Time  4    Period  Weeks    Status  New    Target Date  05/28/19      PT SHORT TERM GOAL #5   Title  Patient ambulates 10' & turns 90* to position to sit with RW & prostheses with maxA (2 people for safety).    Time  4    Period  Weeks    Status  Revised    Target Date  05/28/19         PT Long Term Goals - 04/19/19 2144      PT LONG TERM GOAL #1   Title  Patient and family verbalize and demonstrate understanding of proper prosthetic care to enable safe utilization of prostheses. (All LTGs Target Date: 09/24/2019)    Time  6    Period  Months    Status  On-going    Target Date  09/24/19      PT LONG TERM GOAL #2   Title  Patient tolerates wear of bilateral prostheses >90% of awake hours without skin issues or limb pain to enable functional potential during his day.    Time  6    Period  Months    Status  On-going    Target Date  09/24/19      PT LONG TERM GOAL #3   Title  Squat pivot transfer bed to w/c or chair to w/c with prostheses modified independent.    Time  6    Period  Months    Status  On-going    Target Date  09/24/19      PT LONG TERM GOAL #4   Title  Sit to / from stand elevated w/c to walker with prostheses with supervision.    Time  6    Period  Months    Status  On-going    Target Date  09/24/19      PT LONG TERM GOAL #5   Title  Standing balance with walker support with bilateral prostheses static stance for 5 minutes, scanning environment and reaches 2" anteriorly with supervision.    Time  6    Period  Months    Status  On-going    Target Date  09/24/19      PT LONG TERM GOAL #6   Title  Patient ambulates 55' between 2 chairs including turning 90* to position to sit with RW & prostheses with minA from family safely.    Time  6    Period  Months    Status  On-going    Target Date  09/24/19            Plan - 05/13/19 1212    Clinical Impression Statement  Continued to work on functional transfers with RW but continues to have difficulty with backing up. Able to show improved gait tolerance and longer step length during his second round of ambulation after max cuing. His son would like him to be able to transfer to a golf cart eventually so PT will begin to work towards this.    Personal Factors and Comorbidities   Age;Comorbidity 3+;Fitness;Time since onset of injury/illness/exacerbation    Comorbidities  Bil TTAs, DM2, PAD, HTN, CHF, A-Fib, TIA, CKDst3,    Examination-Activity Limitations  Locomotion Level;Sit;Stand;Toileting;Transfers    Examination-Participation Restrictions  Community Activity    Stability/Clinical Decision Making  Evolving/Moderate complexity    Rehab Potential  Good    PT Frequency  2x / week    PT Duration  Other (comment)   25 weeks (6 months)   PT Treatment/Interventions  ADLs/Self Care Home Management;DME Instruction;Gait training;Stair training;Functional mobility training;Therapeutic activities;Therapeutic exercise;Balance training;Neuromuscular re-education;Patient/family education;Prosthetic Training;Manual techniques;Passive range of motion;Vestibular    PT Next Visit Plan  work towards updated STGs, transfers for stand pivot, and golf cart trfer, Nustep, standing balance & gait with RW when 2nd person available to assist    Consulted and Agree with Plan of Care  Patient;Family member/caregiver    Family Member Consulted  son, English [place]       Patient will benefit from skilled therapeutic intervention in order to improve the following deficits and impairments:  Abnormal gait, Cardiopulmonary status limiting activity, Decreased activity tolerance, Decreased balance, Decreased endurance, Decreased knowledge of use of DME, Decreased mobility, Decreased range of motion, Decreased scar mobility, Decreased strength, Increased edema, Impaired flexibility, Impaired UE functional use, Postural dysfunction, Prosthetic Dependency, Pain  Visit Diagnosis: Other abnormalities of gait and mobility  Unsteadiness on feet  Abnormal posture  Weakness generalized  History of fall  Contracture of muscle, multiple sites  Stiffness of left knee, not elsewhere classified  Stiffness of right knee, not elsewhere classified  Localized edema     Problem List Patient Active Problem  List   Diagnosis Date Noted  . Weakness generalized 04/05/2019  . S/P BKA (below knee amputation) unilateral, left ([place]) 02/15/2019  . Pressure injury of skin 02/03/2019  . TIA (transient ischemic attack) 02/03/2019  . Transient speech disturbance 02/02/2019  . Chronic indwelling Foley catheter 01/22/2019  . Postoperative wound infection 01/20/2019  . Normocytic anemia 01/18/2019  . Cellulitis 01/18/2019  . Infection of deep incisional surgical site after procedure   . Abnormal urinalysis   . Labile blood glucose   . Urinary retention   . S/P bilateral BKA (below knee amputation) ([place])   . Acute blood loss anemia   . Urinary retention due to benign prostatic hyperplasia 12/28/2018  . Unilateral complete BKA, left, initial encounter ([place]) 12/25/2018  . Dehiscence of amputation stump ([place])   . History of transmetatarsal amputation of left foot ([place]) 12/09/2018  . Critical limb ischemia with history of revascularization of  same extremity   . Gangrene of left foot ([place])   . Diabetic ulcer of toe of left foot associated with type 2 diabetes mellitus ([place])   . Cellulitis of left lower extremity   . Toe infection 11/18/2018  . Peripheral edema   . Acute on chronic diastolic CHF (congestive heart failure) ([place]) 11/09/2018  . S/P BKA (below knee amputation) unilateral, right ([place]) 10/05/2018  . Hyponatremia   . Essential hypertension   . Postoperative pain   . Pain of left heel   . Unable to maintain weight-bearing   . Type 2 diabetes mellitus with diabetic peripheral angiopathy with gangrene ([place])   . Anemia due to acute blood loss   . Chronic kidney disease, stage 3a   . Acquired absence of right leg below knee ([place]) 09/07/2018  . Gangrene of right foot ([place])   . DNR (do not resuscitate) discussion   . Goals of care, counseling/discussion   . Palliative care by specialist   . Type 2 diabetes mellitus with vascular disease ([place])   . Severe protein-calorie malnutrition ([place])   .  Ischemic foot 08/20/2018  . Critical lower limb ischemia 08/03/2018  . Type 2 diabetes mellitus with foot ulcer, without long-term current use of insulin ([place])   . Gastroesophageal reflux disease   . Cellulitis of great toe of right foot 05/29/2018  . Atrial fibrillation, chronic   . Chest pain 07/25/2017  . PAF (paroxysmal atrial fibrillation) ([place]) 07/25/2017  . BPH (benign prostatic hyperplasia) 07/25/2017  . Shortness of breath 05/11/2017  . Hypothyroidism 05/11/2017  . Chronic diastolic CHF (congestive heart failure) ([place]) 05/11/2017  . Moderate aortic stenosis 06/12/2016  . RBBB 06/12/2016  . Peripheral vascular disease ([place]) 08/04/2012  . Essential hypertension, benign 08/04/2012  . Hyperlipidemia 08/04/2012  . Dizziness 08/04/2012    Silvestre Mesi 05/13/2019, 12:21 PM  [facility] [address] [place], [place], [address] Phone: [phone]   Fax:  [phone]  Name: [name] MRN: [id number] Date of Birth: Jun 03, 1927

## 2019-05-17 ENCOUNTER — Other Ambulatory Visit: Payer: Self-pay

## 2019-05-17 ENCOUNTER — Ambulatory Visit: Payer: Medicare Other | Admitting: Physical Therapy

## 2019-05-17 ENCOUNTER — Encounter: Payer: Self-pay | Admitting: Physical Therapy

## 2019-05-17 DIAGNOSIS — R293 Abnormal posture: Secondary | ICD-10-CM | POA: Diagnosis not present

## 2019-05-17 DIAGNOSIS — M6249 Contracture of muscle, multiple sites: Secondary | ICD-10-CM

## 2019-05-17 DIAGNOSIS — R531 Weakness: Secondary | ICD-10-CM | POA: Diagnosis not present

## 2019-05-17 DIAGNOSIS — Z9181 History of falling: Secondary | ICD-10-CM | POA: Diagnosis not present

## 2019-05-17 DIAGNOSIS — R2689 Other abnormalities of gait and mobility: Secondary | ICD-10-CM

## 2019-05-17 DIAGNOSIS — R600 Localized edema: Secondary | ICD-10-CM

## 2019-05-17 DIAGNOSIS — R2681 Unsteadiness on feet: Secondary | ICD-10-CM | POA: Diagnosis not present

## 2019-05-17 DIAGNOSIS — M25661 Stiffness of right knee, not elsewhere classified: Secondary | ICD-10-CM

## 2019-05-17 NOTE — Therapy (Signed)
[facility] Physical Therapy [address] [place], [place], [address] Phone: [phone]   Fax:  [phone]  Physical Therapy Treatment  Patient Details  Name: [name] MRN: [id number] Date of Birth: 1927-07-20 Referring Provider (PT): Travis Score, MD   Encounter Date: 05/17/2019  PT End of Session - 05/17/19 1402    Visit Number  13    Number of Visits  50    Date for PT Re-Evaluation  06/30/19    Authorization Type  [employer] Medicare    Authorization Time Period  $30 co-pay    Progress Note Due on Visit  20    PT Start Time  1300    PT Stop Time  1346    PT Time Calculation (min)  46 min    Equipment Utilized During Treatment  Gait belt    Activity Tolerance  Patient tolerated treatment well    Behavior During Therapy  WFL for tasks assessed/performed       Past Medical History:  Diagnosis Date  . Arthritis   . Borderline diabetic   . Diabetes mellitus without complication ([place])   . Glaucoma   . Hyperlipidemia   . Hypertension   . PVD (peripheral vascular disease) ([place])   . Sleep apnea    Cpap ordered but doesnt use  . Urinary retention due to benign prostatic hyperplasia 12/28/2018    Past Surgical History:  Procedure Laterality Date  . ABDOMINAL AORTOGRAM W/LOWER EXTREMITY N/A 07/27/2018   Procedure: ABDOMINAL AORTOGRAM W/LOWER EXTREMITY;  Surgeon: Travis Harp, MD;  Location: [place] [facility];  Service: Cardiovascular;  Laterality: N/A;  . AMPUTATION Right 08/28/2018   Procedure: RIGHT BELOW KNEE AMPUTATION;  Surgeon: Travis Minion, MD;  Location: [place];  Service: Orthopedics;  Laterality: Right;  . AMPUTATION Left 12/02/2018   Procedure: LEFT TRANSMETATARSAL AMPUTATION AND NEGATIVE PRESSURE WOUND VAC PLACEMENT;  Surgeon: Travis Minion, MD;  Location: [place];  Service: Orthopedics;  Laterality: Left;  . AMPUTATION Left 12/18/2018   Procedure: LEFT BELOW KNEE AMPUTATION;  Surgeon: Travis Minion, MD;  Location: [place];  Service:  Orthopedics;  Laterality: Left;  . APPENDECTOMY  1943  . BELOW KNEE LEG AMPUTATION Left 12/18/2018  . CATARACT EXTRACTION  2012   x2  . COLONOSCOPY N/A 11/01/2014   Procedure: COLONOSCOPY;  Surgeon: Travis Signs, MD;  [facility];  Service: Gastroenterology;  Laterality: N/A;  . ESOPHAGOGASTRODUODENOSCOPY N/A 11/01/2014   Procedure: ESOPHAGOGASTRODUODENOSCOPY (EGD);  Surgeon: Travis Signs, MD;  [facility];  Service: Gastroenterology;  Laterality: N/A;  . HERNIA REPAIR Left   . INGUINAL HERNIA REPAIR Right 03/01/2013   Procedure: RIGHT INGUINAL HERNIORRHAPHY;  Surgeon: Travis So, MD;  Location: [facility];  Service: General;  Laterality: Right;  . INSERTION OF MESH Right 03/01/2013   Procedure: INSERTION OF MESH;  Surgeon: Travis So, MD;  Location: [facility];  Service: General;  Laterality: Right;  . LOWER EXTREMITY ANGIOGRAPHY Right 08/25/2018   Procedure: LOWER EXTREMITY ANGIOGRAPHY;  Surgeon: Travis Hampshire, MD;  Location: [place] [facility];  Service: Cardiovascular;  Laterality: Right;  . LOWER EXTREMITY ANGIOGRAPHY N/A 11/25/2018   Procedure: LOWER EXTREMITY ANGIOGRAPHY;  Surgeon: Travis Hampshire, MD;  Location: [place] [facility];  Service: Cardiovascular;  Laterality: N/A;  . NM MYOCAR PERF WALL MOTION  06/05/2010   no significant ischemia  . PERIPHERAL VASCULAR ATHERECTOMY Right 08/03/2018   Procedure: PERIPHERAL VASCULAR ATHERECTOMY;  Surgeon: Travis Harp, MD;  Location: [place] [facility];  Service: Cardiovascular;  Laterality: Right;  . PERIPHERAL VASCULAR ATHERECTOMY Left 11/23/2018   Procedure: PERIPHERAL VASCULAR ATHERECTOMY;  Surgeon: Travis Harp, MD;  Location: [place] [facility];  Service: Cardiovascular;  Laterality: Left;  sfa with dc balloon  . PERIPHERAL VASCULAR BALLOON ANGIOPLASTY Left 11/25/2018   Procedure: PERIPHERAL VASCULAR BALLOON ANGIOPLASTY;  Surgeon: Travis Hampshire, MD;  Location: [place] [facility];  Service:  Cardiovascular;  Laterality: Left;  popliteal  . PERIPHERAL VASCULAR INTERVENTION Left 11/25/2018   Procedure: PERIPHERAL VASCULAR INTERVENTION;  Surgeon: Travis Hampshire, MD;  Location: [place] [facility];  Service: Cardiovascular;  Laterality: Left;  tibial peroneal trunk and peroneal stents   . stent  06/14/2010   left leg  . STUMP REVISION Left 01/20/2019   Procedure: REVISION LEFT BELOW KNEE AMPUTATION;  Surgeon: Travis Minion, MD;  Location: [place];  Service: Orthopedics;  Laterality: Left;  . US ECHOCARDIOGRAPHY  01/21/2006   moderate mitral annular ca+, mild MR, AOV moderately sclerotic.    There were no vitals filed for this visit.  Subjective Assessment - 05/17/19 1257    Subjective  No falls. He is wearing prostheses most of awake hours without issues.    Patient is accompained by:  Family member   son, [name]   Pertinent History  Bil TTAs, DM2, PAD, HTN, CHF, A-Fib, TIA, CKDst3    Patient Stated Goals  walk with prostheses in home & in/out house. Son would like him to be able to toilet & get in/out bed by himself.    Currently in Pain?  No/denies                       [facility] Adult PT Treatment/Exercise - 05/17/19 1300      Transfers   Transfers  Sit to Stand;Stand to Sit;Squat Pivot Transfers;Lateral/Scoot Transfers    Sit to Stand  3: Mod assist;With upper extremity assist;From chair/3-in-1;Other (comment);With armrests    Sit to Stand Details  Manual facilitation for weight shifting;Verbal cues for safe use of DME/AE;Verbal cues for technique;Verbal cues for sequencing    Sit to Stand: Patient Percentage  60%    Stand to Sit  4: Min assist;With upper extremity assist;With armrests;To chair/3-in-1;Other (comment)    Stand to Sit Details (indicate cue type and reason)  Verbal cues for technique;Verbal cues for sequencing;Manual facilitation for weight shifting;Verbal cues for safe use of DME/AE    Squat Pivot Transfers  2: Max assist;With upper extremity  assistance;With armrests   with RW w/c <> Nustep   Squat Pivot Transfer Details (indicate cue type and reason)  verbal & manual cues for wt shift esp backing up, upright posture & sequencing    Comments  Sit to/from stand w/c to sink pulling on sink with minA.       Ambulation/Gait   Ambulation/Gait  Yes    Ambulation/Gait Assistance  2: Max assist   2 people for safety   Ambulation/Gait Assistance Details  verbal & manual / tactile cues on upright posture, wt shift to stance limb, RW control & step length.      Ambulation Distance (Feet)  25 Feet   12', 14' & 25'   Assistive device  Prostheses;Rolling walker    Ambulation Surface  Level;Indoor    Gait Comments  close chair follow from son      Exercises   Other Exercises   Standing at sink 3 min verbal cues  on upright posture      Knee/Hip Exercises: Stretches   Passive Hamstring Stretch  Both;60 seconds    Passive Hamstring Stretch Limitations  resting b/w walks with BLEs on 8" foot stool with knees extended      Knee/Hip Exercises: Aerobic   Nustep  Level 5 BUEs & BLEs 5 min   seat 12     Prosthetics   Prosthetic Care Comments   checking prostheses as increased risk of slippage with shrinkers under liners.     Current prosthetic wear tolerance (days/week)   daily    Current prosthetic wear tolerance (#hours/day)   4-5 hrs 2x/day    Edema  pitting edema    Residual limb condition   no open areas, normal color & temperature,     Education Provided  Proper wear schedule/adjustment;Proper Donning;Other (comment)   see prosthetic care   Person(s) Educated  Patient;Child(ren)    Education Method  Explanation;Verbal cues;Demonstration    Education Method  Verbalized understanding;Needs further instruction               PT Short Term Goals - 05/06/19 1318      PT SHORT TERM GOAL #1   Title  Patient's son verbalizes understanding of adjusting ply socks with edema changes.  (All STGs Target Date: 05/28/2019)    Time  4     Period  Weeks    Status  New    Target Date  05/28/19      PT SHORT TERM GOAL #2   Title  Patient tolerates prostheses wear >12 hrs total / day without skin issues.    Time  4    Period  Weeks    Status  Revised    Target Date  05/28/19      PT SHORT TERM GOAL #3   Title  Sit to / from stand w/c to walker with modA.    Time  4    Period  Weeks    Status  Revised    Target Date  05/28/19      PT SHORT TERM GOAL #4   Title  Standing balance with walker support 2 minutes & scans right/left, up/down with cervical motion with minA.    Time  4    Period  Weeks    Status  New    Target Date  05/28/19      PT SHORT TERM GOAL #5   Title  Patient ambulates 10' & turns 90* to position to sit with RW & prostheses with maxA (2 people for safety).    Time  4    Period  Weeks    Status  Revised    Target Date  05/28/19        PT Long Term Goals - 04/19/19 2144      PT LONG TERM GOAL #1   Title  Patient and family verbalize and demonstrate understanding of proper prosthetic care to enable safe utilization of prostheses. (All LTGs Target Date: 09/24/2019)    Time  6    Period  Months    Status  On-going    Target Date  09/24/19      PT LONG TERM GOAL #2   Title  Patient tolerates wear of bilateral prostheses >90% of awake hours without skin issues or limb pain to enable functional potential during his day.    Time  6    Period  Months    Status  On-going    Target Date  09/24/19      PT LONG TERM GOAL #3   Title  Squat pivot transfer bed to w/c or chair to w/c with prostheses modified independent.    Time  6    Period  Months    Status  On-going    Target Date  09/24/19      PT LONG TERM GOAL #4   Title  Sit to / from stand elevated w/c to walker with prostheses with supervision.    Time  6    Period  Months    Status  On-going    Target Date  09/24/19      PT LONG TERM GOAL #5   Title  Standing balance with walker support with bilateral prostheses static stance for  5 minutes, scanning environment and reaches 2" anteriorly with supervision.    Time  6    Period  Months    Status  On-going    Target Date  09/24/19      PT LONG TERM GOAL #6   Title  Patient ambulates 58' between 2 chairs including turning 90* to position to sit with RW & prostheses with minA from family safely.    Time  6    Period  Months    Status  On-going    Target Date  09/24/19            Plan - 05/17/19 1403    Clinical Impression Statement  Patient improved forward moving gait with increased distance and slightly less assistance. He struggles with turning esp taking steps back to a chair. Once he improves these skills he may progress to ambulating b/w 2 chairs with one person assist.    Personal Factors and Comorbidities  Age;Comorbidity 3+;Fitness;Time since onset of injury/illness/exacerbation    Comorbidities  Bil TTAs, DM2, PAD, HTN, CHF, A-Fib, TIA, CKDst3,    Examination-Activity Limitations  Locomotion Level;Sit;Stand;Toileting;Transfers    Examination-Participation Restrictions  Community Activity    Stability/Clinical Decision Making  Evolving/Moderate complexity    Rehab Potential  Good    PT Frequency  2x / week    PT Duration  Other (comment)   25 weeks (6 months)   PT Treatment/Interventions  ADLs/Self Care Home Management;DME Instruction;Gait training;Stair training;Functional mobility training;Therapeutic activities;Therapeutic exercise;Balance training;Neuromuscular re-education;Patient/family education;Prosthetic Training;Manual techniques;Passive range of motion;Vestibular    PT Next Visit Plan  work towards updated STGs, transfers for stand pivot, and golf cart trfer, Nustep, standing balance & gait with RW when 2nd person available to assist    Consulted and Agree with Plan of Care  Patient;Family member/caregiver    Family Member Consulted  son, Gianfranco Araki       Patient will benefit from skilled therapeutic intervention in order to improve the  following deficits and impairments:  Abnormal gait, Cardiopulmonary status limiting activity, Decreased activity tolerance, Decreased balance, Decreased endurance, Decreased knowledge of use of DME, Decreased mobility, Decreased range of motion, Decreased scar mobility, Decreased strength, Increased edema, Impaired flexibility, Impaired UE functional use, Postural dysfunction, Prosthetic Dependency, Pain  Visit Diagnosis: Other abnormalities of gait and mobility  Unsteadiness on feet  Abnormal posture  Weakness generalized  History of fall  Contracture of muscle, multiple sites  Stiffness of right knee, not elsewhere classified  Localized edema     Problem List Patient Active Problem List   Diagnosis Date Noted  . Weakness generalized 04/05/2019  . S/P BKA (below knee amputation) unilateral, left ([place]) 02/15/2019  . Pressure injury of skin 02/03/2019  .  TIA (transient ischemic attack) 02/03/2019  . Transient speech disturbance 02/02/2019  . Chronic indwelling Foley catheter 01/22/2019  . Postoperative wound infection 01/20/2019  . Normocytic anemia 01/18/2019  . Cellulitis 01/18/2019  . Infection of deep incisional surgical site after procedure   . Abnormal urinalysis   . Labile blood glucose   . Urinary retention   . S/P bilateral BKA (below knee amputation) ([place])   . Acute blood loss anemia   . Urinary retention due to benign prostatic hyperplasia 12/28/2018  . Unilateral complete BKA, left, initial encounter ([place]) 12/25/2018  . Dehiscence of amputation stump ([place])   . History of transmetatarsal amputation of left foot ([place]) 12/09/2018  . Critical limb ischemia with history of revascularization of same extremity   . Gangrene of left foot ([place])   . Diabetic ulcer of toe of left foot associated with type 2 diabetes mellitus ([place])   . Cellulitis of left lower extremity   . Toe infection 11/18/2018  . Peripheral edema   . Acute on chronic diastolic CHF (congestive  heart failure) ([place]) 11/09/2018  . S/P BKA (below knee amputation) unilateral, right ([place]) 10/05/2018  . Hyponatremia   . Essential hypertension   . Postoperative pain   . Pain of left heel   . Unable to maintain weight-bearing   . Type 2 diabetes mellitus with diabetic peripheral angiopathy with gangrene ([place])   . Anemia due to acute blood loss   . Chronic kidney disease, stage 3a   . Acquired absence of right leg below knee ([place]) 09/07/2018  . Gangrene of right foot ([place])   . DNR (do not resuscitate) discussion   . Goals of care, counseling/discussion   . Palliative care by specialist   . Type 2 diabetes mellitus with vascular disease ([place])   . Severe protein-calorie malnutrition ([place])   . Ischemic foot 08/20/2018  . Critical lower limb ischemia 08/03/2018  . Type 2 diabetes mellitus with foot ulcer, without long-term current use of insulin ([place])   . Gastroesophageal reflux disease   . Cellulitis of great toe of right foot 05/29/2018  . Atrial fibrillation, chronic   . Chest pain 07/25/2017  . PAF (paroxysmal atrial fibrillation) ([place]) 07/25/2017  . BPH (benign prostatic hyperplasia) 07/25/2017  . Shortness of breath 05/11/2017  . Hypothyroidism 05/11/2017  . Chronic diastolic CHF (congestive heart failure) ([place]) 05/11/2017  . Moderate aortic stenosis 06/12/2016  . RBBB 06/12/2016  . Peripheral vascular disease ([place]) 08/04/2012  . Essential hypertension, benign 08/04/2012  . Hyperlipidemia 08/04/2012  . Dizziness 08/04/2012    Jamey Reas  PT, DPT 05/17/2019, 2:06 PM  [facility] Physical Therapy [address] [place], [place], [address] Phone: [phone]   Fax:  [phone]  Name: [name] MRN: [id number] Date of Birth: 01/07/1928

## 2019-05-18 ENCOUNTER — Ambulatory Visit: Payer: Medicare Other | Admitting: Physical Therapy

## 2019-05-18 ENCOUNTER — Ambulatory Visit (INDEPENDENT_AMBULATORY_CARE_PROVIDER_SITE_OTHER): Payer: Medicare Other | Admitting: Family

## 2019-05-18 ENCOUNTER — Encounter: Payer: Self-pay | Admitting: Physical Therapy

## 2019-05-18 ENCOUNTER — Encounter: Payer: Self-pay | Admitting: Family

## 2019-05-18 VITALS — Ht 70.0 in | Wt 209.0 lb

## 2019-05-18 DIAGNOSIS — Z9181 History of falling: Secondary | ICD-10-CM

## 2019-05-18 DIAGNOSIS — R293 Abnormal posture: Secondary | ICD-10-CM | POA: Diagnosis not present

## 2019-05-18 DIAGNOSIS — Z89511 Acquired absence of right leg below knee: Secondary | ICD-10-CM

## 2019-05-18 DIAGNOSIS — N401 Enlarged prostate with lower urinary tract symptoms: Secondary | ICD-10-CM | POA: Diagnosis not present

## 2019-05-18 DIAGNOSIS — R2689 Other abnormalities of gait and mobility: Secondary | ICD-10-CM

## 2019-05-18 DIAGNOSIS — M25661 Stiffness of right knee, not elsewhere classified: Secondary | ICD-10-CM

## 2019-05-18 DIAGNOSIS — R531 Weakness: Secondary | ICD-10-CM | POA: Diagnosis not present

## 2019-05-18 DIAGNOSIS — R2681 Unsteadiness on feet: Secondary | ICD-10-CM

## 2019-05-18 DIAGNOSIS — R338 Other retention of urine: Secondary | ICD-10-CM | POA: Diagnosis not present

## 2019-05-18 DIAGNOSIS — M25662 Stiffness of left knee, not elsewhere classified: Secondary | ICD-10-CM

## 2019-05-18 DIAGNOSIS — Z89512 Acquired absence of left leg below knee: Secondary | ICD-10-CM

## 2019-05-18 DIAGNOSIS — M6249 Contracture of muscle, multiple sites: Secondary | ICD-10-CM

## 2019-05-18 NOTE — Therapy (Signed)
[facility] [address] [place], [place], [address] Phone: [phone]   Fax:  [phone]  Physical Therapy Treatment  Patient Details  Name: [name] MRN: [id number] Date of Birth: 1927-09-09 Referring Provider (PT): Travis Score, MD   Encounter Date: 05/18/2019  PT End of Session - 05/18/19 1421    Visit Number  14    Number of Visits  50    Date for PT Re-Evaluation  06/30/19    Authorization Type  [employer] Medicare    Authorization Time Period  $30 co-pay    Progress Note Due on Visit  20    PT Start Time  1421    PT Stop Time  1515    PT Time Calculation (min)  54 min    Equipment Utilized During Treatment  Gait belt    Activity Tolerance  Patient tolerated treatment well    Behavior During Therapy  WFL for tasks assessed/performed       Past Medical History:  Diagnosis Date  . Arthritis   . Borderline diabetic   . Diabetes mellitus without complication ([place])   . Glaucoma   . Hyperlipidemia   . Hypertension   . PVD (peripheral vascular disease) ([place])   . Sleep apnea    Cpap ordered but doesnt use  . Urinary retention due to benign prostatic hyperplasia 12/28/2018    Past Surgical History:  Procedure Laterality Date  . ABDOMINAL AORTOGRAM W/LOWER EXTREMITY N/A 07/27/2018   Procedure: ABDOMINAL AORTOGRAM W/LOWER EXTREMITY;  Surgeon: Travis Harp, MD;  Location: [place] [facility];  Service: Cardiovascular;  Laterality: N/A;  . AMPUTATION Right 08/28/2018   Procedure: RIGHT BELOW KNEE AMPUTATION;  Surgeon: Travis Minion, MD;  Location: [place];  Service: Orthopedics;  Laterality: Right;  . AMPUTATION Left 12/02/2018   Procedure: LEFT TRANSMETATARSAL AMPUTATION AND NEGATIVE PRESSURE WOUND VAC PLACEMENT;  Surgeon: Travis Minion, MD;  Location: [place];  Service: Orthopedics;  Laterality: Left;  . AMPUTATION Left 12/18/2018   Procedure: LEFT BELOW KNEE AMPUTATION;  Surgeon: Travis Minion, MD;  Location: [place];  Service:  Orthopedics;  Laterality: Left;  . APPENDECTOMY  1943  . BELOW KNEE LEG AMPUTATION Left 12/18/2018  . CATARACT EXTRACTION  2012   x2  . COLONOSCOPY N/A 11/01/2014   Procedure: COLONOSCOPY;  Surgeon: Travis Signs, MD;  [facility];  Service: Gastroenterology;  Laterality: N/A;  . ESOPHAGOGASTRODUODENOSCOPY N/A 11/01/2014   Procedure: ESOPHAGOGASTRODUODENOSCOPY (EGD);  Surgeon: Travis Signs, MD;  [facility];  Service: Gastroenterology;  Laterality: N/A;  . HERNIA REPAIR Left   . INGUINAL HERNIA REPAIR Right 03/01/2013   Procedure: RIGHT INGUINAL HERNIORRHAPHY;  Surgeon: Travis So, MD;  Location: [facility];  Service: General;  Laterality: Right;  . INSERTION OF MESH Right 03/01/2013   Procedure: INSERTION OF MESH;  Surgeon: Travis So, MD;  Location: [facility];  Service: General;  Laterality: Right;  . LOWER EXTREMITY ANGIOGRAPHY Right 08/25/2018   Procedure: LOWER EXTREMITY ANGIOGRAPHY;  Surgeon: Travis Hampshire, MD;  Location: [place] [facility];  Service: Cardiovascular;  Laterality: Right;  . LOWER EXTREMITY ANGIOGRAPHY N/A 11/25/2018   Procedure: LOWER EXTREMITY ANGIOGRAPHY;  Surgeon: Travis Hampshire, MD;  Location: [place] [facility];  Service: Cardiovascular;  Laterality: N/A;  . NM MYOCAR PERF WALL MOTION  06/05/2010   no significant ischemia  . PERIPHERAL VASCULAR ATHERECTOMY Right 08/03/2018   Procedure: PERIPHERAL VASCULAR ATHERECTOMY;  Surgeon: Travis Harp, MD;  Location: [place] [facility];  Service: Cardiovascular;  Laterality: Right;  . PERIPHERAL VASCULAR ATHERECTOMY Left 11/23/2018   Procedure: PERIPHERAL VASCULAR ATHERECTOMY;  Surgeon: Travis Harp, MD;  Location: [place] [facility];  Service: Cardiovascular;  Laterality: Left;  sfa with dc balloon  . PERIPHERAL VASCULAR BALLOON ANGIOPLASTY Left 11/25/2018   Procedure: PERIPHERAL VASCULAR BALLOON ANGIOPLASTY;  Surgeon: Travis Hampshire, MD;  Location: [place] [facility];  Service:  Cardiovascular;  Laterality: Left;  popliteal  . PERIPHERAL VASCULAR INTERVENTION Left 11/25/2018   Procedure: PERIPHERAL VASCULAR INTERVENTION;  Surgeon: Travis Hampshire, MD;  Location: [place] [facility];  Service: Cardiovascular;  Laterality: Left;  tibial peroneal trunk and peroneal stents   . stent  06/14/2010   left leg  . STUMP REVISION Left 01/20/2019   Procedure: REVISION LEFT BELOW KNEE AMPUTATION;  Surgeon: Travis Minion, MD;  Location: [place];  Service: Orthopedics;  Laterality: Left;  . US ECHOCARDIOGRAPHY  01/21/2006   moderate mitral annular ca+, mild MR, AOV moderately sclerotic.    There were no vitals filed for this visit.  Subjective Assessment - 05/18/19 1422    Subjective  He saw [name] at Dr. Sharol Given office today. They were able to have catheter changed.    Patient is accompained by:  Family member   son, [name]   Pertinent History  Bil TTAs, DM2, PAD, HTN, CHF, A-Fib, TIA, CKDst3    Patient Stated Goals  walk with prostheses in home & in/out house. Son would like him to be able to toilet & get in/out bed by himself.    Currently in Pain?  No/denies                       [facility] Adult PT Treatment/Exercise - 05/18/19 1425      Transfers   Transfers  Sit to Stand;Stand to Sit;Squat Pivot Transfers;Lateral/Scoot Transfers    Sit to Stand  3: Mod assist;With upper extremity assist;From chair/3-in-1;Other (comment);With armrests   to RW being stabilized by PT   Sit to Stand Details  Manual facilitation for weight shifting;Verbal cues for safe use of DME/AE;Verbal cues for technique;Verbal cues for sequencing    Sit to Stand: Patient Percentage  60%    Stand to Sit  4: Min assist;With upper extremity assist;With armrests;To chair/3-in-1;Other (comment)   from RW   Stand to Sit Details (indicate cue type and reason)  Verbal cues for technique;Verbal cues for sequencing;Manual facilitation for weight shifting;Verbal cues for safe use of DME/AE    Squat  Pivot Transfers  --    Lateral/Scoot Transfers  3: Mod assist;With slide board   with bil. TTA prostheses   Lateral/Scoot Transfer Details (indicate cue type and reason)  demo, manual & verbal cues on wt shift forward to lift buttocks first then pivot    Comments  --      Ambulation/Gait   Ambulation/Gait  Yes    Ambulation/Gait Assistance  2: Max assist   2 people for safety   Ambulation/Gait Assistance Details  verbal & manual cues on upright posture, wt shift to stance limb and clearance in swing.     Ambulation Distance (Feet)  25 Feet    Assistive device  Prostheses;Rolling walker    Gait Pattern  Step-to pattern;Decreased step length - right;Decreased step length - left;Decreased hip/knee flexion - right;Decreased hip/knee flexion - left;Right foot flat;Left foot flat;Right flexed knee in stance;Left flexed knee in stance;Shuffle;Lateral hip instability;Trunk flexed;Poor foot  clearance - left;Poor foot clearance - right    Ambulation Surface  Level;Indoor    Gait Comments  --      Self-Care   Self-Care  ADL's    ADL's  PT discussed / educated pt & son in positioning in urinating and ability to empty bladder once catheter removed.  PT verbal cues on possibility of urinating while positioned in STEADY.  PT demo in bathroom options: urinating with ant. w/c to toilet straddling with bil. TTA prostheses with penis positioned over water.  PT demo & verbal cues on option for bowel movement to transfer forward Johnston facing backwards on toilet then scoot backwards to w/c when finished.  Son reports uncertain if w/c will fit in bathroom at patient's house. PT recommended using pt w/c while pt not in w/c to assess how w/c would fit in bathroom.      Other Self-Care Comments   Pt had difficulty in MD office with transfer to exam table.  Son reports patient has appt at urologist in one month and exam table there is higher.  PT recommended possibility of taking STEADY transfer machine to doctor appt in  their handicap Lucianne Lei.  Son to assess if he can fit both his father in his w/c & STEADY in their [place].        Exercises   Other Exercises   --      Knee/Hip Exercises: Stretches   Passive Hamstring Stretch  Both;60 seconds    Passive Hamstring Stretch Limitations  resting b/w walks with BLEs on 8" foot stool with knees extended      Knee/Hip Exercises: Aerobic   Nustep  Level 5 BUEs & BLEs 5 min with verbal & manual cues on full ROM   seat 12     Prosthetics   Prosthetic Care Comments   --    Current prosthetic wear tolerance (days/week)   daily    Current prosthetic wear tolerance (#hours/day)   4-5 hrs 2x/day    Edema  pitting edema    Residual limb condition   no open areas, normal color & temperature,     Education Provided  Proper wear schedule/adjustment;Proper Donning;Other (comment)   see prosthetic care   Person(s) Educated  Patient;Child(ren)    Education Method  Explanation;Verbal cues    Education Method  Verbalized understanding;Verbal cues required    Donning Prosthesis  Maximum assist   pt maxA but son modified independent   Doffing Prosthesis  Maximum assist   pt maxA but son modified independent              PT Short Term Goals - 05/06/19 1318      PT SHORT TERM GOAL #1   Title  Patient's son verbalizes understanding of adjusting ply socks with edema changes.  (All STGs Target Date: 05/28/2019)    Time  4    Period  Weeks    Status  New    Target Date  05/28/19      PT SHORT TERM GOAL #2   Title  Patient tolerates prostheses wear >12 hrs total / day without skin issues.    Time  4    Period  Weeks    Status  Revised    Target Date  05/28/19      PT SHORT TERM GOAL #3   Title  Sit to / from stand w/c to walker with modA.    Time  4    Period  Weeks  Status  Revised    Target Date  05/28/19      PT SHORT TERM GOAL #4   Title  Standing balance with walker support 2 minutes & scans right/left, up/down with cervical motion with minA.    Time  4     Period  Weeks    Status  New    Target Date  05/28/19      PT SHORT TERM GOAL #5   Title  Patient ambulates 10' & turns 90* to position to sit with RW & prostheses with maxA (2 people for safety).    Time  4    Period  Weeks    Status  Revised    Target Date  05/28/19        PT Long Term Goals - 04/19/19 2144      PT LONG TERM GOAL #1   Title  Patient and family verbalize and demonstrate understanding of proper prosthetic care to enable safe utilization of prostheses. (All LTGs Target Date: 09/24/2019)    Time  6    Period  Months    Status  On-going    Target Date  09/24/19      PT LONG TERM GOAL #2   Title  Patient tolerates wear of bilateral prostheses >90% of awake hours without skin issues or limb pain to enable functional potential during his day.    Time  6    Period  Months    Status  On-going    Target Date  09/24/19      PT LONG TERM GOAL #3   Title  Squat pivot transfer bed to w/c or chair to w/c with prostheses modified independent.    Time  6    Period  Months    Status  On-going    Target Date  09/24/19      PT LONG TERM GOAL #4   Title  Sit to / from stand elevated w/c to walker with prostheses with supervision.    Time  6    Period  Months    Status  On-going    Target Date  09/24/19      PT LONG TERM GOAL #5   Title  Standing balance with walker support with bilateral prostheses static stance for 5 minutes, scanning environment and reaches 2" anteriorly with supervision.    Time  6    Period  Months    Status  On-going    Target Date  09/24/19      PT LONG TERM GOAL #6   Title  Patient ambulates 4' between 2 chairs including turning 90* to position to sit with RW & prostheses with minA from family safely.    Time  6    Period  Months    Status  On-going    Target Date  09/24/19            Plan - 05/18/19 1421    Clinical Impression Statement  PT educated patient & his son on options for toileting if catheter able to be removed in  future.  PT worked on scooting transfers with sliding board & bil prostheses to enable pt to participate more in transfers.  PT recommended changing height of second surface (bed, BSC, etc) to level. His son appears to understand.    Personal Factors and Comorbidities  Age;Comorbidity 3+;Fitness;Time since onset of injury/illness/exacerbation    Comorbidities  Bil TTAs, DM2, PAD, HTN, CHF, A-Fib, TIA, CKDst3,    Examination-Activity Limitations  Locomotion Level;Sit;Stand;Toileting;Transfers    Examination-Participation Restrictions  Community Activity    Stability/Clinical Decision Making  Evolving/Moderate complexity    Rehab Potential  Good    PT Frequency  2x / week    PT Duration  Other (comment)   25 weeks (6 months)   PT Treatment/Interventions  ADLs/Self Care Home Management;DME Instruction;Gait training;Stair training;Functional mobility training;Therapeutic activities;Therapeutic exercise;Balance training;Neuromuscular re-education;Patient/family education;Prosthetic Training;Manual techniques;Passive range of motion;Vestibular    PT Next Visit Plan  work towards updated STGs, transfers for stand pivot, and golf cart trfer, Nustep, standing balance & gait with RW when 2nd person available to assist    Consulted and Agree with Plan of Care  Patient;Family member/caregiver    Family Member Consulted  son, Mena Lienau       Patient will benefit from skilled therapeutic intervention in order to improve the following deficits and impairments:  Abnormal gait, Cardiopulmonary status limiting activity, Decreased activity tolerance, Decreased balance, Decreased endurance, Decreased knowledge of use of DME, Decreased mobility, Decreased range of motion, Decreased scar mobility, Decreased strength, Increased edema, Impaired flexibility, Impaired UE functional use, Postural dysfunction, Prosthetic Dependency, Pain  Visit Diagnosis: Other abnormalities of gait and mobility  Unsteadiness on  feet  Abnormal posture  Weakness generalized  History of fall  Contracture of muscle, multiple sites  Stiffness of right knee, not elsewhere classified  Stiffness of left knee, not elsewhere classified     Problem List Patient Active Problem List   Diagnosis Date Noted  . Weakness generalized 04/05/2019  . S/P BKA (below knee amputation) unilateral, left ([place]) 02/15/2019  . Pressure injury of skin 02/03/2019  . TIA (transient ischemic attack) 02/03/2019  . Transient speech disturbance 02/02/2019  . Chronic indwelling Foley catheter 01/22/2019  . Postoperative wound infection 01/20/2019  . Normocytic anemia 01/18/2019  . Cellulitis 01/18/2019  . Infection of deep incisional surgical site after procedure   . Abnormal urinalysis   . Labile blood glucose   . Urinary retention   . S/P bilateral BKA (below knee amputation) ([place])   . Acute blood loss anemia   . Urinary retention due to benign prostatic hyperplasia 12/28/2018  . Unilateral complete BKA, left, initial encounter ([place]) 12/25/2018  . Dehiscence of amputation stump ([place])   . History of transmetatarsal amputation of left foot ([place]) 12/09/2018  . Critical limb ischemia with history of revascularization of same extremity   . Gangrene of left foot ([place])   . Diabetic ulcer of toe of left foot associated with type 2 diabetes mellitus ([place])   . Cellulitis of left lower extremity   . Toe infection 11/18/2018  . Peripheral edema   . Acute on chronic diastolic CHF (congestive heart failure) ([place]) 11/09/2018  . S/P BKA (below knee amputation) unilateral, right ([place]) 10/05/2018  . Hyponatremia   . Essential hypertension   . Postoperative pain   . Pain of left heel   . Unable to maintain weight-bearing   . Type 2 diabetes mellitus with diabetic peripheral angiopathy with gangrene ([place])   . Anemia due to acute blood loss   . Chronic kidney disease, stage 3a   . Acquired absence of right leg below knee ([place]) 09/07/2018  .  Gangrene of right foot ([place])   . DNR (do not resuscitate) discussion   . Goals of care, counseling/discussion   . Palliative care by specialist   . Type 2 diabetes mellitus with vascular disease ([place])   . Severe protein-calorie malnutrition ([place])   . Ischemic foot 08/20/2018  .  Critical lower limb ischemia 08/03/2018  . Type 2 diabetes mellitus with foot ulcer, without long-term current use of insulin ([place])   . Gastroesophageal reflux disease   . Cellulitis of great toe of right foot 05/29/2018  . Atrial fibrillation, chronic   . Chest pain 07/25/2017  . PAF (paroxysmal atrial fibrillation) ([place]) 07/25/2017  . BPH (benign prostatic hyperplasia) 07/25/2017  . Shortness of breath 05/11/2017  . Hypothyroidism 05/11/2017  . Chronic diastolic CHF (congestive heart failure) ([place]) 05/11/2017  . Moderate aortic stenosis 06/12/2016  . RBBB 06/12/2016  . Peripheral vascular disease ([place]) 08/04/2012  . Essential hypertension, benign 08/04/2012  . Hyperlipidemia 08/04/2012  . Dizziness 08/04/2012    Jamey Reas PT, DPT 05/19/2019, 8:33 AM  [facility] [address] [place], [place], [address] Phone: [phone]   Fax:  [phone]  Name: [name] MRN: [id number] Date of Birth: 1927-10-13

## 2019-05-18 NOTE — Progress Notes (Signed)
Office Visit Note   Patient: [name]           Date of Birth: Aug 12, 1927           MRN: [id number] Visit Date: 05/18/2019              Requested by: Redmond School, [place],  [place] [id number] PCP: Redmond School, MD  Chief Complaint  Patient presents with  . Left Leg - Follow-up    01/20/19 left BKA revision       HPI: The patient is an 86 year old gentleman seen today status post bilateral below-knee amputations.  He is currently working with physical therapy to begin gait training.  He is significantly weak having a difficulty transferring at home.  Also seen today to have his Foley catheter changed out he had been having home health assistance with this but unfortunately when he began going to physical therapy appointments he was dropped from home health.  Has urology follow-up in 1 month  Assessment & Plan: Visit Diagnoses:  1. S/P bilateral BKA (below knee amputation) ([place])   2. Urinary retention due to benign prostatic hyperplasia     Plan: Foley catheter changed out without incident.  coude used.  Patient tolerated it well.  His amputations are well-healed well consolidated he will continue with his gait training.  Follow-up in the office as needed.  Follow-Up Instructions: Return if symptoms worsen or fail to improve.   Ortho Exam  Patient is alert, oriented, no adenopathy, well-dressed, normal affect, normal respiratory effort. On examination bilateral below-knee amputations are well-healed well consolidated there is no callus areas no impending skin breakdown.  Imaging: No results found. No images are attached to the encounter.  Labs: Lab Results  Component Value Date   HGBA1C 5.6 01/19/2019   HGBA1C 5.9 (H) 11/09/2018   HGBA1C 7.0 (H) 05/29/2018   ESRSEDRATE 45 (H) 01/18/2019   ESRSEDRATE 84 (H) 11/18/2018   CRP 6.2 (H) 01/18/2019   CRP 5.1 (H) 11/18/2018   CRP <0.8 05/29/2018   REPTSTATUS 01/23/2019 FINAL 01/18/2019   CULT  01/18/2019    NO GROWTH 5 DAYS Performed at [facility], [place] [address] [place], [place] [address]    LABORGA ESCHERICHIA COLI (A) 09/28/2018     Lab Results  Component Value Date   ALBUMIN 3.1 (L) 02/04/2019   ALBUMIN 3.4 (L) 02/02/2019   ALBUMIN 3.0 (L) 01/17/2019   PREALBUMIN 11.6 (L) 01/18/2019    Lab Results  Component Value Date   MG 2.1 02/04/2019   MG 2.1 11/20/2018   MG 2.0 11/11/2018   No results found for: [facility]  Lab Results  Component Value Date   PREALBUMIN 11.6 (L) 01/18/2019   CBC EXTENDED Latest Ref Rng & Units 02/04/2019 02/02/2019 02/02/2019  WBC 4.0 - 10.5 K/uL 7.4 - 7.7  RBC 4.22 - 5.81 MIL/uL 2.88(L) - 3.05(L)  HGB 13.0 - 17.0 g/dL 9.1(L) 10.5(L) 9.6(L)  HCT 39.0 - 52.0 % 27.9(L) 31.0(L) 29.5(L)  PLT 150 - 400 K/uL 376 - 382  NEUTROABS 1.7 - 7.7 K/uL 3.3 - 3.7  LYMPHSABS 0.7 - 4.0 K/uL 3.0 - 2.6     Body mass index is 29.99 kg/m.  Orders:  No orders of the defined types were placed in this encounter.  No orders of the defined types were placed in this encounter.    Procedures: No procedures performed  Clinical Data: No additional findings.  ROS:  All other systems negative, except  as noted in the HPI. Review of Systems  Constitutional: Negative for chills and fever.  Cardiovascular: Positive for leg swelling.  Musculoskeletal: Negative for arthralgias.  Skin: Negative for wound.  Neurological: Positive for weakness.    Objective: Vital Signs: Ht 5\' 10"  (1.778 m)   Wt 209 lb (94.8 kg)   BMI 29.99 kg/m   Specialty Comments:  No specialty comments available.  PMFS History: Patient Active Problem List   Diagnosis Date Noted  . Weakness generalized 04/05/2019  . S/P BKA (below knee amputation) unilateral, left ([place]) 02/15/2019  . Pressure injury of skin 02/03/2019  . TIA (transient ischemic attack) 02/03/2019  . Transient speech disturbance 02/02/2019  . Chronic indwelling Foley catheter 01/22/2019  .  Postoperative wound infection 01/20/2019  . Normocytic anemia 01/18/2019  . Cellulitis 01/18/2019  . Infection of deep incisional surgical site after procedure   . Abnormal urinalysis   . Labile blood glucose   . Urinary retention   . S/P bilateral BKA (below knee amputation) ([place])   . Acute blood loss anemia   . Urinary retention due to benign prostatic hyperplasia 12/28/2018  . Unilateral complete BKA, left, initial encounter ([place]) 12/25/2018  . Dehiscence of amputation stump ([place])   . History of transmetatarsal amputation of left foot ([place]) 12/09/2018  . Critical limb ischemia with history of revascularization of same extremity   . Gangrene of left foot ([place])   . Diabetic ulcer of toe of left foot associated with type 2 diabetes mellitus ([place])   . Cellulitis of left lower extremity   . Toe infection 11/18/2018  . Peripheral edema   . Acute on chronic diastolic CHF (congestive heart failure) ([place]) 11/09/2018  . S/P BKA (below knee amputation) unilateral, right ([place]) 10/05/2018  . Hyponatremia   . Essential hypertension   . Postoperative pain   . Pain of left heel   . Unable to maintain weight-bearing   . Type 2 diabetes mellitus with diabetic peripheral angiopathy with gangrene ([place])   . Anemia due to acute blood loss   . Chronic kidney disease, stage 3a   . Acquired absence of right leg below knee ([place]) 09/07/2018  . Gangrene of right foot ([place])   . DNR (do not resuscitate) discussion   . Goals of care, counseling/discussion   . Palliative care by specialist   . Type 2 diabetes mellitus with vascular disease ([place])   . Severe protein-calorie malnutrition ([place])   . Ischemic foot 08/20/2018  . Critical lower limb ischemia 08/03/2018  . Type 2 diabetes mellitus with foot ulcer, without long-term current use of insulin ([place])   . Gastroesophageal reflux disease   . Cellulitis of great toe of right foot 05/29/2018  . Atrial fibrillation, chronic   . Chest pain 07/25/2017  . PAF  (paroxysmal atrial fibrillation) ([place]) 07/25/2017  . BPH (benign prostatic hyperplasia) 07/25/2017  . Shortness of breath 05/11/2017  . Hypothyroidism 05/11/2017  . Chronic diastolic CHF (congestive heart failure) ([place]) 05/11/2017  . Moderate aortic stenosis 06/12/2016  . RBBB 06/12/2016  . Peripheral vascular disease ([place]) 08/04/2012  . Essential hypertension, benign 08/04/2012  . Hyperlipidemia 08/04/2012  . Dizziness 08/04/2012   Past Medical History:  Diagnosis Date  . Arthritis   . Borderline diabetic   . Diabetes mellitus without complication ([place])   . Glaucoma   . Hyperlipidemia   . Hypertension   . PVD (peripheral vascular disease) ([place])   . Sleep apnea    Cpap ordered but doesnt use  .  Urinary retention due to benign prostatic hyperplasia 12/28/2018    Family History  Problem Relation Age of Onset  . Cancer Mother   . Heart attack Father   . Stroke Father   . Heart attack Brother   . Heart attack Brother     Past Surgical History:  Procedure Laterality Date  . ABDOMINAL AORTOGRAM W/LOWER EXTREMITY N/A 07/27/2018   Procedure: ABDOMINAL AORTOGRAM W/LOWER EXTREMITY;  Surgeon: Lorretta Harp, MD;  Location: [facility];  Service: Cardiovascular;  Laterality: N/A;  . AMPUTATION Right 08/28/2018   Procedure: RIGHT BELOW KNEE AMPUTATION;  Surgeon: [name], MD;  Location: [facility];  Service: Orthopedics;  Laterality: Right;  . AMPUTATION Left 12/02/2018   Procedure: LEFT TRANSMETATARSAL AMPUTATION AND NEGATIVE PRESSURE WOUND VAC PLACEMENT;  Surgeon: [name], MD;  Location: [facility];  Service: Orthopedics;  Laterality: Left;  . AMPUTATION Left 12/18/2018   Procedure: LEFT BELOW KNEE AMPUTATION;  Surgeon: [name], MD;  Location: [facility];  Service: Orthopedics;  Laterality: Left;  . APPENDECTOMY  1943  . BELOW KNEE LEG AMPUTATION Left 12/18/2018  . CATARACT EXTRACTION  2012   x2  . COLONOSCOPY N/A 11/01/2014   Procedure: COLONOSCOPY;  Surgeon: Aviva Signs, MD;  [facility];  Service: Gastroenterology;  Laterality: N/A;  . ESOPHAGOGASTRODUODENOSCOPY N/A 11/01/2014   Procedure: ESOPHAGOGASTRODUODENOSCOPY (EGD);  Surgeon: Aviva Signs, MD;  [facility];  Service: Gastroenterology;  Laterality: N/A;  . HERNIA REPAIR Left   . INGUINAL HERNIA REPAIR Right 03/01/2013   Procedure: RIGHT INGUINAL HERNIORRHAPHY;  Surgeon: Jamesetta So, MD;  Location: [facility];  Service: General;  Laterality: Right;  . INSERTION OF MESH Right 03/01/2013   Procedure: INSERTION OF MESH;  Surgeon: Jamesetta So, MD;  Location[facility];  Service: General;  Laterality: Right;  . LOWER EXTREMITY ANGIOGRAPHY Right 08/25/2018   Procedure: LOWER EXTREMITY ANGIOGRAPHY;  Surgeon: Wellington Hampshire, MD;  Location: [facility];  Service: Cardiovascular;  Laterality: Right;  . LOWER EXTREMITY ANGIOGRAPHY N/A 11/25/2018   Procedure: LOWER EXTREMITY ANGIOGRAPHY;  Surgeon: Wellington Hampshire, MD;  Location: [facility];  Service: Cardiovascular;  Laterality: N/A;  . NM MYOCAR PERF WALL MOTION  06/05/2010   no significant ischemia  . PERIPHERAL VASCULAR ATHERECTOMY Right 08/03/2018   Procedure: PERIPHERAL VASCULAR ATHERECTOMY;  Surgeon: Lorretta Harp, MD;  Location: [facility];  Service: Cardiovascular;  Laterality: Right;  . PERIPHERAL VASCULAR ATHERECTOMY Left 11/23/2018   Procedure: PERIPHERAL VASCULAR ATHERECTOMY;  Surgeon: Lorretta Harp, MD;  Location: [facility];  Service: Cardiovascular;  Laterality: Left;  sfa with dc balloon  . PERIPHERAL VASCULAR BALLOON ANGIOPLASTY Left 11/25/2018   Procedure: PERIPHERAL VASCULAR BALLOON ANGIOPLASTY;  Surgeon: Wellington Hampshire, MD;  Location: [facility];  Service: Cardiovascular;  Laterality: Left;  popliteal  . PERIPHERAL VASCULAR INTERVENTION Left 11/25/2018   Procedure: PERIPHERAL VASCULAR INTERVENTION;  Surgeon: Wellington Hampshire, MD;  Location: [facility];   Service: Cardiovascular;  Laterality: Left;  tibial peroneal trunk and peroneal stents   . stent  06/14/2010   left leg  . STUMP REVISION Left 01/20/2019   Procedure: REVISION LEFT BELOW KNEE AMPUTATION;  Surgeon: [name], MD;  Location: [facility];  Service: Orthopedics;  Laterality: Left;  . US ECHOCARDIOGRAPHY  01/21/2006   moderate mitral annular ca+, mild MR, AOV moderately sclerotic.   Social History   Occupational History  .  Occupation: Retired Psychologist, sport and exercise  Tobacco Use  . Smoking status: Former Smoker    Quit date: 01/13/1965    Years since quitting: 54.3  . Smokeless tobacco: Former Systems developer    Types: Chew  Substance and Sexual Activity  . Alcohol use: No  . Drug use: No  . Sexual activity: Not Currently

## 2019-05-22 DIAGNOSIS — M79672 Pain in left foot: Secondary | ICD-10-CM | POA: Diagnosis not present

## 2019-05-24 ENCOUNTER — Ambulatory Visit (INDEPENDENT_AMBULATORY_CARE_PROVIDER_SITE_OTHER): Payer: Medicare Other | Admitting: Physical Therapy

## 2019-05-24 ENCOUNTER — Other Ambulatory Visit: Payer: Self-pay

## 2019-05-24 ENCOUNTER — Encounter: Payer: Self-pay | Admitting: Physical Therapy

## 2019-05-24 DIAGNOSIS — R293 Abnormal posture: Secondary | ICD-10-CM

## 2019-05-24 DIAGNOSIS — R2689 Other abnormalities of gait and mobility: Secondary | ICD-10-CM

## 2019-05-24 DIAGNOSIS — R2681 Unsteadiness on feet: Secondary | ICD-10-CM

## 2019-05-24 DIAGNOSIS — R531 Weakness: Secondary | ICD-10-CM

## 2019-05-24 DIAGNOSIS — R600 Localized edema: Secondary | ICD-10-CM

## 2019-05-24 DIAGNOSIS — M6249 Contracture of muscle, multiple sites: Secondary | ICD-10-CM | POA: Diagnosis not present

## 2019-05-24 NOTE — Therapy (Signed)
[facility] Physical Therapy [address] [place], [place], [address] Phone: [phone]   Fax:  [phone]  Physical Therapy Treatment  Patient Details  Name: [name] MRN: [id number] Date of Birth: [birth year] Referring Provider (PT): [name], MD   Encounter Date: 05/24/2019  PT End of Session - 05/24/19 1301    Visit Number  15    Number of Visits  50    Date for PT Re-Evaluation  06/30/19    Authorization Type  [employer] Medicare    Authorization Time Period  $30 co-pay    Progress Note Due on Visit  20    PT Start Time  1300    PT Stop Time  1345    PT Time Calculation (min)  45 min    Equipment Utilized During Treatment  Gait belt    Activity Tolerance  Patient tolerated treatment well    Behavior During Therapy  [facility] for tasks assessed/performed       Past Medical History:  Diagnosis Date  . Arthritis   . Borderline diabetic   . Diabetes mellitus without complication ([place])   . Glaucoma   . Hyperlipidemia   . Hypertension   . PVD (peripheral vascular disease) ([place])   . Sleep apnea    Cpap ordered but doesnt use  . Urinary retention due to benign prostatic hyperplasia 12/28/2018    Past Surgical History:  Procedure Laterality Date  . ABDOMINAL AORTOGRAM W/LOWER EXTREMITY N/A 07/27/2018   Procedure: ABDOMINAL AORTOGRAM W/LOWER EXTREMITY;  Surgeon: Lorretta Harp, MD;  Location: [place] [facility];  Service: Cardiovascular;  Laterality: N/A;  . AMPUTATION Right 08/28/2018   Procedure: RIGHT BELOW KNEE AMPUTATION;  Surgeon: [name], MD;  Location: [place];  Service: Orthopedics;  Laterality: Right;  . AMPUTATION Left 12/02/2018   Procedure: LEFT TRANSMETATARSAL AMPUTATION AND NEGATIVE PRESSURE WOUND VAC PLACEMENT;  Surgeon: [name], MD;  Location: [place];  Service: Orthopedics;  Laterality: Left;  . AMPUTATION Left 12/18/2018   Procedure: LEFT BELOW KNEE AMPUTATION;  Surgeon: [name], MD;  Location: [place];  Service:  Orthopedics;  Laterality: Left;  . APPENDECTOMY  1943  . BELOW KNEE LEG AMPUTATION Left 12/18/2018  . CATARACT EXTRACTION  2012   x2  . COLONOSCOPY N/A 11/01/2014   Procedure: COLONOSCOPY;  Surgeon: Aviva Signs, MD;  [facility];  Service: Gastroenterology;  Laterality: N/A;  . ESOPHAGOGASTRODUODENOSCOPY N/A 11/01/2014   Procedure: ESOPHAGOGASTRODUODENOSCOPY (EGD);  Surgeon: Aviva Signs, MD;  [facility];  Service: Gastroenterology;  Laterality: N/A;  . HERNIA REPAIR Left   . INGUINAL HERNIA REPAIR Right 03/01/2013   Procedure: RIGHT INGUINAL HERNIORRHAPHY;  Surgeon: Jamesetta So, MD;  Location: [facility];  Service: General;  Laterality: Right;  . INSERTION OF MESH Right 03/01/2013   Procedure: INSERTION OF MESH;  Surgeon: Jamesetta So, MD;  [facility];  Service: General;  Laterality: Right;  . LOWER EXTREMITY ANGIOGRAPHY Right 08/25/2018   Procedure: LOWER EXTREMITY ANGIOGRAPHY;  Surgeon: Wellington Hampshire, MD;  Location: [place] [facility];  Service: Cardiovascular;  Laterality: Right;  . LOWER EXTREMITY ANGIOGRAPHY N/A 11/25/2018   Procedure: LOWER EXTREMITY ANGIOGRAPHY;  Surgeon: Wellington Hampshire, MD;  Location: [place] [facility];  Service: Cardiovascular;  Laterality: N/A;  . NM MYOCAR PERF WALL MOTION  06/05/2010   no significant ischemia  . PERIPHERAL VASCULAR ATHERECTOMY Right 08/03/2018   Procedure: PERIPHERAL VASCULAR ATHERECTOMY;  Surgeon: Lorretta Harp, MD;  Location: [place] [facility];  Service: Cardiovascular;  Laterality: Right;  . PERIPHERAL VASCULAR ATHERECTOMY Left 11/23/2018   Procedure: PERIPHERAL VASCULAR ATHERECTOMY;  Surgeon: Lorretta Harp, MD;  Location: [place] [facility];  Service: Cardiovascular;  Laterality: Left;  sfa with dc balloon  . PERIPHERAL VASCULAR BALLOON ANGIOPLASTY Left 11/25/2018   Procedure: PERIPHERAL VASCULAR BALLOON ANGIOPLASTY;  Surgeon: Wellington Hampshire, MD;  Location: [place] [facility];  Service:  Cardiovascular;  Laterality: Left;  popliteal  . PERIPHERAL VASCULAR INTERVENTION Left 11/25/2018   Procedure: PERIPHERAL VASCULAR INTERVENTION;  Surgeon: Wellington Hampshire, MD;  Location: [place] [facility];  Service: Cardiovascular;  Laterality: Left;  tibial peroneal trunk and peroneal stents   . stent  06/14/2010   left leg  . STUMP REVISION Left 01/20/2019   Procedure: REVISION LEFT BELOW KNEE AMPUTATION;  Surgeon: [name], MD;  Location: [place];  Service: Orthopedics;  Laterality: Left;  . US ECHOCARDIOGRAPHY  01/21/2006   moderate mitral annular ca+, mild MR, AOV moderately sclerotic.    There were no vitals filed for this visit.  Subjective Assessment - 05/24/19 1302    Subjective  His right hand edema & function improved but left hand has been bothering him.  He continues to work on standing tolerance with STEADY with family.    Patient is accompained by:  Family member   son, Travion Ke   Pertinent History  Bil TTAs, DM2, PAD, HTN, CHF, A-Fib, TIA, CKDst3    Patient Stated Goals  walk with prostheses in home & in/out house. Son would like him to be able to toilet & get in/out bed by himself.    Currently in Pain?  No/denies                       [facility] Adult PT Treatment/Exercise - 05/24/19 1300      Transfers   Transfers  Sit to Stand;Stand to Sit;Squat Pivot Transfers;Lateral/Scoot Transfers;Stand Pivot Transfers    Sit to Stand  2: Max assist;With upper extremity assist;With armrests;From chair/3-in-1;Other (comment)   to RW being stabilized by PT   Sit to Stand Details  Manual facilitation for weight shifting;Verbal cues for safe use of DME/AE;Verbal cues for technique;Verbal cues for sequencing    Sit to Stand: Patient Percentage  40%    Stand to Sit  3: Mod assist;With upper extremity assist;With armrests;To chair/3-in-1;Other (comment)   from RW   Stand to Sit Details (indicate cue type and reason)  Verbal cues for technique;Verbal cues for  sequencing;Manual facilitation for weight shifting;Verbal cues for safe use of DME/AE    Stand Pivot Transfers  2: Max assist   RW & bil. TTA prostheses  2 person assist for safety   Stand Pivot Transfer Details (indicate cue type and reason)  detailed constant manual & verbal cues on sequence, wt shift & upright posture.     Lateral/Scoot Transfers  --      Ambulation/Gait   Ambulation/Gait  Yes    Ambulation/Gait Assistance  2: Max assist   2 people for safety   Ambulation/Gait Assistance Details  Willette Alma, [facility] present for alignment modifications to bilateral prostheses.  Manual & verbal cues on upright posture, wt shift to stance limb and step clearance.      Ambulation Distance (Feet)  10 Feet   10' X 3   Assistive device  Prostheses;Rolling walker    Gait Pattern  Step-to pattern;Decreased step length - right;Decreased step  length - left;Decreased hip/knee flexion - right;Decreased hip/knee flexion - left;Right foot flat;Left foot flat;Right flexed knee in stance;Left flexed knee in stance;Shuffle;Lateral hip instability;Trunk flexed;Poor foot clearance - left;Poor foot clearance - right    Ambulation Surface  Level;Indoor    Gait Comments  walking backwards 3 steps ea LE with RW support with +2 Total Assist with manual & verbal cues on weight shift to stance limb, upright posture & hip extension.        Self-Care   Self-Care  --    ADL's  --    Other Self-Care Comments   --      Exercises   Other Exercises   PT demo & verbal cues on exercise in STEADY of back extension. Son verbalized understanding.        Knee/Hip Exercises: Stretches   Passive Hamstring Stretch  --    Passive Hamstring Stretch Limitations  --      Knee/Hip Exercises: Aerobic   Nustep  Level 5 BUEs & BLEs 5 min with verbal & manual cues on full ROM   seat 12     Prosthetics   Prosthetic Care Comments   phantom pain vs phantom sensation and pressure from socket with weight bearing vs limb pain.       Current prosthetic wear tolerance (days/week)   daily    Current prosthetic wear tolerance (#hours/day)   4-5 hrs 2x/day    Edema  pitting edema    Residual limb condition   no open areas, normal color & temperature,     Education Provided  Proper wear schedule/adjustment;Proper Donning;Other (comment);Skin check;Residual limb care   see prosthetic care   Person(s) Educated  Patient;Child(ren)    Education Method  Explanation;Verbal cues    Education Method  Verbalized understanding;Verbal cues required;Needs further instruction    Donning Prosthesis  Maximum assist   son able to properly donne   Doffing Prosthesis  Maximum assist   son able to properly doffe              PT Short Term Goals - 05/24/19 2237      PT SHORT TERM GOAL #1   Title  Patient's son verbalizes understanding of adjusting ply socks with edema changes.  (All STGs Target Date: 05/28/2019)    Baseline  MET 05/24/2019    Time  4    Period  Weeks    Status  Achieved    Target Date  05/28/19      PT SHORT TERM GOAL #2   Title  Patient tolerates prostheses wear >12 hrs total / day without skin issues.    Time  4    Period  Weeks    Status  On-going    Target Date  05/28/19      PT SHORT TERM GOAL #3   Title  Sit to / from stand w/c to walker with modA.    Time  4    Period  Weeks    Status  On-going    Target Date  05/28/19      PT SHORT TERM GOAL #4   Title  Standing balance with walker support 2 minutes & scans right/left, up/down with cervical motion with minA.    Time  4    Period  Weeks    Status  On-going    Target Date  05/28/19      PT SHORT TERM GOAL #5   Title  Patient ambulates 10' & turns 90* to position to  sit with RW & prostheses with maxA (2 people for safety).    Time  4    Period  Weeks    Status  On-going    Target Date  05/28/19        PT Long Term Goals - 04/19/19 2144      PT LONG TERM GOAL #1   Title  Patient and family verbalize and demonstrate understanding of  proper prosthetic care to enable safe utilization of prostheses. (All LTGs Target Date: 09/24/2019)    Time  6    Period  Months    Status  On-going    Target Date  09/24/19      PT LONG TERM GOAL #2   Title  Patient tolerates wear of bilateral prostheses >90% of awake hours without skin issues or limb pain to enable functional potential during his day.    Time  6    Period  Months    Status  On-going    Target Date  09/24/19      PT LONG TERM GOAL #3   Title  Squat pivot transfer bed to w/c or chair to w/c with prostheses modified independent.    Time  6    Period  Months    Status  On-going    Target Date  09/24/19      PT LONG TERM GOAL #4   Title  Sit to / from stand elevated w/c to walker with prostheses with supervision.    Time  6    Period  Months    Status  On-going    Target Date  09/24/19      PT LONG TERM GOAL #5   Title  Standing balance with walker support with bilateral prostheses static stance for 5 minutes, scanning environment and reaches 2" anteriorly with supervision.    Time  6    Period  Months    Status  On-going    Target Date  09/24/19      PT LONG TERM GOAL #6   Title  Patient ambulates 26' between 2 chairs including turning 90* to position to sit with RW & prostheses with minA from family safely.    Time  6    Period  Months    Status  On-going    Target Date  09/24/19            Plan - 05/24/19 1302    Clinical Impression Statement  Prosthetist present at PT session for dynamic alignment (CPO able to assess while PT assisting gait).  Changes improved knee stability.  PT session worked on prosthetic gait including stepping backwards.    Personal Factors and Comorbidities  Age;Comorbidity 3+;Fitness;Time since onset of injury/illness/exacerbation    Comorbidities  Bil TTAs, DM2, PAD, HTN, CHF, A-Fib, TIA, CKDst3,    Examination-Activity Limitations  Locomotion Level;Sit;Stand;Toileting;Transfers    Examination-Participation Restrictions   Community Activity    Stability/Clinical Decision Making  Evolving/Moderate complexity    Rehab Potential  Good    PT Frequency  2x / week    PT Duration  Other (comment)   25 weeks (6 months)   PT Treatment/Interventions  ADLs/Self Care Home Management;DME Instruction;Gait training;Stair training;Functional mobility training;Therapeutic activities;Therapeutic exercise;Balance training;Neuromuscular re-education;Patient/family education;Prosthetic Training;Manual techniques;Passive range of motion;Vestibular    PT Next Visit Plan  assess updated STGs, transfers for stand pivot, and golf cart trfer, Nustep, standing balance & gait with RW when 2nd person available to assist    Consulted and Agree with  Plan of Care  Patient;Family member/caregiver    Family Member Consulted  son, Rosco Harriott       Patient will benefit from skilled therapeutic intervention in order to improve the following deficits and impairments:  Abnormal gait, Cardiopulmonary status limiting activity, Decreased activity tolerance, Decreased balance, Decreased endurance, Decreased knowledge of use of DME, Decreased mobility, Decreased range of motion, Decreased scar mobility, Decreased strength, Increased edema, Impaired flexibility, Impaired UE functional use, Postural dysfunction, Prosthetic Dependency, Pain  Visit Diagnosis: Other abnormalities of gait and mobility  Unsteadiness on feet  Abnormal posture  Weakness generalized  Contracture of muscle, multiple sites  Localized edema     Problem List Patient Active Problem List   Diagnosis Date Noted  . Weakness generalized 04/05/2019  . S/P BKA (below knee amputation) unilateral, left ([place]) 02/15/2019  . Pressure injury of skin 02/03/2019  . TIA (transient ischemic attack) 02/03/2019  . Transient speech disturbance 02/02/2019  . Chronic indwelling Foley catheter 01/22/2019  . Postoperative wound infection 01/20/2019  . Normocytic anemia 01/18/2019  .  Cellulitis 01/18/2019  . Infection of deep incisional surgical site after procedure   . Abnormal urinalysis   . Labile blood glucose   . Urinary retention   . S/P bilateral BKA (below knee amputation) ([place])   . Acute blood loss anemia   . Urinary retention due to benign prostatic hyperplasia 12/28/2018  . Unilateral complete BKA, left, initial encounter ([place]) 12/25/2018  . Dehiscence of amputation stump ([place])   . History of transmetatarsal amputation of left foot ([place]) 12/09/2018  . Critical limb ischemia with history of revascularization of same extremity   . Gangrene of left foot ([place])   . Diabetic ulcer of toe of left foot associated with type 2 diabetes mellitus ([place])   . Cellulitis of left lower extremity   . Toe infection 11/18/2018  . Peripheral edema   . Acute on chronic diastolic CHF (congestive heart failure) ([place]) 11/09/2018  . S/P BKA (below knee amputation) unilateral, right ([place]) 10/05/2018  . Hyponatremia   . Essential hypertension   . Postoperative pain   . Pain of left heel   . Unable to maintain weight-bearing   . Type 2 diabetes mellitus with diabetic peripheral angiopathy with gangrene ([place])   . Anemia due to acute blood loss   . Chronic kidney disease, stage 3a   . Acquired absence of right leg below knee ([place]) 09/07/2018  . Gangrene of right foot ([place])   . DNR (do not resuscitate) discussion   . Goals of care, counseling/discussion   . Palliative care by specialist   . Type 2 diabetes mellitus with vascular disease ([place])   . Severe protein-calorie malnutrition ([place])   . Ischemic foot 08/20/2018  . Critical lower limb ischemia 08/03/2018  . Type 2 diabetes mellitus with foot ulcer, without long-term current use of insulin ([place])   . Gastroesophageal reflux disease   . Cellulitis of great toe of right foot 05/29/2018  . Atrial fibrillation, chronic   . Chest pain 07/25/2017  . PAF (paroxysmal atrial fibrillation) ([place]) 07/25/2017  . BPH (benign prostatic  hyperplasia) 07/25/2017  . Shortness of breath 05/11/2017  . Hypothyroidism 05/11/2017  . Chronic diastolic CHF (congestive heart failure) ([place]) 05/11/2017  . Moderate aortic stenosis 06/12/2016  . RBBB 06/12/2016  . Peripheral vascular disease ([place]) 08/04/2012  . Essential hypertension, benign 08/04/2012  . Hyperlipidemia 08/04/2012  . Dizziness 08/04/2012    Jamey Reas PT, DPT 05/24/2019, 10:44 PM  [place]  [facility] Therapy [address] [place], [place], [address] Phone: [phone]   Fax:  [phone]  Name: HARPER SMOKER MRN: [id number] Date of Birth: [birth year]

## 2019-05-27 ENCOUNTER — Ambulatory Visit: Payer: Medicare Other | Admitting: Physical Therapy

## 2019-05-27 ENCOUNTER — Other Ambulatory Visit: Payer: Self-pay

## 2019-05-27 ENCOUNTER — Encounter: Payer: Medicare Other | Admitting: Physical Therapy

## 2019-05-27 ENCOUNTER — Encounter: Payer: Self-pay | Admitting: Physical Therapy

## 2019-05-27 DIAGNOSIS — R2689 Other abnormalities of gait and mobility: Secondary | ICD-10-CM | POA: Diagnosis not present

## 2019-05-27 DIAGNOSIS — Z9181 History of falling: Secondary | ICD-10-CM | POA: Diagnosis not present

## 2019-05-27 DIAGNOSIS — R600 Localized edema: Secondary | ICD-10-CM

## 2019-05-27 DIAGNOSIS — M25661 Stiffness of right knee, not elsewhere classified: Secondary | ICD-10-CM

## 2019-05-27 DIAGNOSIS — R293 Abnormal posture: Secondary | ICD-10-CM | POA: Diagnosis not present

## 2019-05-27 DIAGNOSIS — M25662 Stiffness of left knee, not elsewhere classified: Secondary | ICD-10-CM

## 2019-05-27 DIAGNOSIS — R2681 Unsteadiness on feet: Secondary | ICD-10-CM

## 2019-05-27 DIAGNOSIS — R531 Weakness: Secondary | ICD-10-CM | POA: Diagnosis not present

## 2019-05-27 DIAGNOSIS — M6249 Contracture of muscle, multiple sites: Secondary | ICD-10-CM

## 2019-05-27 NOTE — Therapy (Signed)
[facility] [address] [place], [place], [address] Phone: [phone]   Fax:  [phone]  Physical Therapy Treatment  Patient Details  Name: [name] MRN: [id number] Date of Birth: 1927-04-18 Referring Provider (PT): [name], MD   Encounter Date: 05/27/2019  PT End of Session - 05/27/19 1405    Visit Number  16    Number of Visits  50    Date for PT Re-Evaluation  06/30/19    Authorization Type  [employer] Medicare    Authorization Time Period  $30 co-pay    Progress Note Due on Visit  20    PT Start Time  1315    PT Stop Time  1400    PT Time Calculation (min)  45 min    Equipment Utilized During Treatment  Gait belt    Activity Tolerance  Patient tolerated treatment well    Behavior During Therapy  [employer] for tasks assessed/performed       Past Medical History:  Diagnosis Date  . Arthritis   . Borderline diabetic   . Diabetes mellitus without complication ([place])   . Glaucoma   . Hyperlipidemia   . Hypertension   . PVD (peripheral vascular disease) ([place])   . Sleep apnea    Cpap ordered but doesnt use  . Urinary retention due to benign prostatic hyperplasia 12/28/2018    Past Surgical History:  Procedure Laterality Date  . ABDOMINAL AORTOGRAM W/LOWER EXTREMITY N/A 07/27/2018   Procedure: ABDOMINAL AORTOGRAM W/LOWER EXTREMITY;  Surgeon: Lorretta Harp, MD;  Location: [place] [facility];  Service: Cardiovascular;  Laterality: N/A;  . AMPUTATION Right 08/28/2018   Procedure: RIGHT BELOW KNEE AMPUTATION;  Surgeon: [name], MD;  Location: [place];  Service: Orthopedics;  Laterality: Right;  . AMPUTATION Left 12/02/2018   Procedure: LEFT TRANSMETATARSAL AMPUTATION AND NEGATIVE PRESSURE WOUND VAC PLACEMENT;  Surgeon: [name], MD;  Location: [place];  Service: Orthopedics;  Laterality: Left;  . AMPUTATION Left 12/18/2018   Procedure: LEFT BELOW KNEE AMPUTATION;  Surgeon: [name], MD;  Location: [place];  Service:  Orthopedics;  Laterality: Left;  . APPENDECTOMY  1943  . BELOW KNEE LEG AMPUTATION Left 12/18/2018  . CATARACT EXTRACTION  2012   x2  . COLONOSCOPY N/A 11/01/2014   Procedure: COLONOSCOPY;  Surgeon: Aviva Signs, MD;  [facility];  Service: Gastroenterology;  Laterality: N/A;  . ESOPHAGOGASTRODUODENOSCOPY N/A 11/01/2014   Procedure: ESOPHAGOGASTRODUODENOSCOPY (EGD);  Surgeon: Aviva Signs, MD;  [facility];  Service: Gastroenterology;  Laterality: N/A;  . HERNIA REPAIR Left   . INGUINAL HERNIA REPAIR Right 03/01/2013   Procedure: RIGHT INGUINAL HERNIORRHAPHY;  Surgeon: Jamesetta So, MD;  Location: [facility];  Service: General;  Laterality: Right;  . INSERTION OF MESH Right 03/01/2013   Procedure: INSERTION OF MESH;  Surgeon: Jamesetta So, MD;  Location: [facility];  Service: General;  Laterality: Right;  . LOWER EXTREMITY ANGIOGRAPHY Right 08/25/2018   Procedure: LOWER EXTREMITY ANGIOGRAPHY;  Surgeon: Wellington Hampshire, MD;  Location: [place] [facility];  Service: Cardiovascular;  Laterality: Right;  . LOWER EXTREMITY ANGIOGRAPHY N/A 11/25/2018   Procedure: LOWER EXTREMITY ANGIOGRAPHY;  Surgeon: Wellington Hampshire, MD;  Location: [place] [facility];  Service: Cardiovascular;  Laterality: N/A;  . NM MYOCAR PERF WALL MOTION  06/05/2010   no significant ischemia  . PERIPHERAL VASCULAR ATHERECTOMY Right 08/03/2018   Procedure: PERIPHERAL VASCULAR ATHERECTOMY;  Surgeon: Lorretta Harp, MD;  Location: [place] [facility];  Service: Cardiovascular;  Laterality: Right;  . PERIPHERAL VASCULAR ATHERECTOMY Left 11/23/2018   Procedure: PERIPHERAL VASCULAR ATHERECTOMY;  Surgeon: Lorretta Harp, MD;  Location: [place] [facility];  Service: Cardiovascular;  Laterality: Left;  sfa with dc balloon  . PERIPHERAL VASCULAR BALLOON ANGIOPLASTY Left 11/25/2018   Procedure: PERIPHERAL VASCULAR BALLOON ANGIOPLASTY;  Surgeon: Wellington Hampshire, MD;  Location: [place] [facility];  Service:  Cardiovascular;  Laterality: Left;  popliteal  . PERIPHERAL VASCULAR INTERVENTION Left 11/25/2018   Procedure: PERIPHERAL VASCULAR INTERVENTION;  Surgeon: Wellington Hampshire, MD;  Location: [place] [facility];  Service: Cardiovascular;  Laterality: Left;  tibial peroneal trunk and peroneal stents   . stent  06/14/2010   left leg  . STUMP REVISION Left 01/20/2019   Procedure: REVISION LEFT BELOW KNEE AMPUTATION;  Surgeon: [name], MD;  Location: [place];  Service: Orthopedics;  Laterality: Left;  . US ECHOCARDIOGRAPHY  01/21/2006   moderate mitral annular ca+, mild MR, AOV moderately sclerotic.    There were no vitals filed for this visit.  Subjective Assessment - 05/27/19 1406    Subjective  He stood at sink with son at home. Weight shifting was difficult.    Patient is accompained by:  Family member   son, Graceson Nichelson   Pertinent History  Bil TTAs, DM2, PAD, HTN, CHF, A-Fib, TIA, CKDst3    Patient Stated Goals  walk with prostheses in home & in/out house. Son would like him to be able to toilet & get in/out bed by himself.    Currently in Pain?  No/denies                        [facility] PT Treatment/Exercise - 05/27/19 1315      Transfers   Transfers  Sit to Stand;Stand to Sit;Squat Pivot Transfers;Lateral/Scoot Transfers;Stand Pivot Transfers    Sit to Stand  2: Max assist;With upper extremity assist;With armrests;From chair/3-in-1;Other (comment)   to RW being stabilized by PT   Sit to Stand Details  Manual facilitation for weight shifting;Verbal cues for safe use of DME/AE;Verbal cues for technique;Verbal cues for sequencing    Sit to Stand: Patient Percentage  40%    Stand to Sit  3: Mod assist;With upper extremity assist;With armrests;To chair/3-in-1;Other (comment)   from RW   Stand to Sit Details (indicate cue type and reason)  Verbal cues for technique;Verbal cues for sequencing;Manual facilitation for weight shifting;Verbal cues for safe use of DME/AE     Stand Pivot Transfers  --    Squat Pivot Transfers  3: Mod assist;With upper extremity assistance   w/c <> Nustep with bil. prostheses   Squat Pivot Transfer Details (indicate cue type and reason)  tactile Duncan Dull & verbal cues on weight shift forward over prostheses then pivot.        Ambulation/Gait   Ambulation/Gait  Yes    Ambulation/Gait Assistance  2: Max assist   2 people for safety   Ambulation/Gait Assistance Details  1 rep straight path bringing chair behind pt.  2nd rep to chair 90* on left.  3rd rep to chair 90* on right.    Verbal & manual cues on upright posture, wt shift to stance limb and clearance in swing.     Ambulation Distance (Feet)  20 Feet   20' 1st rep, 5' X 2 + 90* turn to position to sit.   Assistive device  Prostheses;Rolling walker  Gait Pattern  Step-to pattern;Decreased step length - right;Decreased step length - left;Decreased hip/knee flexion - right;Decreased hip/knee flexion - left;Right foot flat;Left foot flat;Right flexed knee in stance;Left flexed knee in stance;Shuffle;Lateral hip instability;Trunk flexed;Poor foot clearance - left;Poor foot clearance - right    Ambulation Surface  Level;Indoor    Gait Comments  Demo, manual & verbal cues on when & how to turn 90* to position to sit.  Turning RW & feet and backing up to chair with MaxA      Neuro Re-ed    Neuro Re-ed Details   standing at sink with BUE support: weight shift right/left with pelvis, weight shift forward with pelvis then using UEs to push trunk upright 3 reps 5 second hold.      Exercises   Other Exercises   --      Knee/Hip Exercises: Aerobic   Nustep  Level 5 BUEs & BLEs 5 min with verbal & manual cues on full ROM   seat 12     Prosthetics   Prosthetic Care Comments   --    Current prosthetic wear tolerance (days/week)   daily    Current prosthetic wear tolerance (#hours/day)   4-5 hrs 2x/day    Edema  pitting edema    Residual limb condition   no open areas, normal color &  temperature,     Education Provided  Proper wear schedule/adjustment;Proper Donning;Skin check;Residual limb care    Person(s) Educated  Patient;Child(ren)    Education Method  Explanation;Verbal cues    Education Method  Verbalized understanding;Verbal cues required;Needs further instruction               PT Short Term Goals - 05/24/19 2237      PT SHORT TERM GOAL #1   Title  Patient's son verbalizes understanding of adjusting ply socks with edema changes.  (All STGs Target Date: 05/28/2019)    Baseline  MET 05/24/2019    Time  4    Period  Weeks    Status  Achieved    Target Date  05/28/19      PT SHORT TERM GOAL #2   Title  Patient tolerates prostheses wear >12 hrs total / day without skin issues.    Time  4    Period  Weeks    Status  On-going    Target Date  05/28/19      PT SHORT TERM GOAL #3   Title  Sit to / from stand w/c to walker with modA.    Time  4    Period  Weeks    Status  On-going    Target Date  05/28/19      PT SHORT TERM GOAL #4   Title  Standing balance with walker support 2 minutes & scans right/left, up/down with cervical motion with minA.    Time  4    Period  Weeks    Status  On-going    Target Date  05/28/19      PT SHORT TERM GOAL #5   Title  Patient ambulates 10' & turns 90* to position to sit with RW & prostheses with maxA (2 people for safety).    Time  4    Period  Weeks    Status  On-going    Target Date  05/28/19        PT Long Term Goals - 04/19/19 2144      PT LONG TERM GOAL #1   Title  Patient  and family verbalize and demonstrate understanding of proper prosthetic care to enable safe utilization of prostheses. (All LTGs Target Date: 09/24/2019)    Time  6    Period  Months    Status  On-going    Target Date  09/24/19      PT LONG TERM GOAL #2   Title  Patient tolerates wear of bilateral prostheses >90% of awake hours without skin issues or limb pain to enable functional potential during his day.    Time  6     Period  Months    Status  On-going    Target Date  09/24/19      PT LONG TERM GOAL #3   Title  Squat pivot transfer bed to w/c or chair to w/c with prostheses modified independent.    Time  6    Period  Months    Status  On-going    Target Date  09/24/19      PT LONG TERM GOAL #4   Title  Sit to / from stand elevated w/c to walker with prostheses with supervision.    Time  6    Period  Months    Status  On-going    Target Date  09/24/19      PT LONG TERM GOAL #5   Title  Standing balance with walker support with bilateral prostheses static stance for 5 minutes, scanning environment and reaches 2" anteriorly with supervision.    Time  6    Period  Months    Status  On-going    Target Date  09/24/19      PT LONG TERM GOAL #6   Title  Patient ambulates 69' between 2 chairs including turning 90* to position to sit with RW & prostheses with minA from family safely.    Time  6    Period  Months    Status  On-going    Target Date  09/24/19            Plan - 05/27/19 1406    Clinical Impression Statement  Patient was able to maintain straighter path during gait and improved ability to turn 90* to position in front of chair to sit. Improving his ability to position as noted would enable some mobility at home with family in future.    Personal Factors and Comorbidities  Age;Comorbidity 3+;Fitness;Time since onset of injury/illness/exacerbation    Comorbidities  Bil TTAs, DM2, PAD, HTN, CHF, A-Fib, TIA, CKDst3,    Examination-Activity Limitations  Locomotion Level;Sit;Stand;Toileting;Transfers    Examination-Participation Restrictions  Community Activity    Stability/Clinical Decision Making  Evolving/Moderate complexity    Rehab Potential  Good    PT Frequency  2x / week    PT Duration  Other (comment)   25 weeks (6 months)   PT Treatment/Interventions  ADLs/Self Care Home Management;DME Instruction;Gait training;Stair training;Functional mobility training;Therapeutic  activities;Therapeutic exercise;Balance training;Neuromuscular re-education;Patient/family education;Prosthetic Training;Manual techniques;Passive range of motion;Vestibular    PT Next Visit Plan  work towards updated STGs, transfers for stand pivot, Nustep, standing balance & gait with RW when 2nd person available to assist    Consulted and Agree with Plan of Care  Patient;Family member/caregiver    Family Member Consulted  son, Xzaviar Maloof       Patient will benefit from skilled therapeutic intervention in order to improve the following deficits and impairments:  Abnormal gait, Cardiopulmonary status limiting activity, Decreased activity tolerance, Decreased balance, Decreased endurance, Decreased knowledge of use of DME, Decreased  mobility, Decreased range of motion, Decreased scar mobility, Decreased strength, Increased edema, Impaired flexibility, Impaired UE functional use, Postural dysfunction, Prosthetic Dependency, Pain  Visit Diagnosis: Other abnormalities of gait and mobility  Unsteadiness on feet  Abnormal posture  Weakness generalized  History of fall  Contracture of muscle, multiple sites  Localized edema  Stiffness of right knee, not elsewhere classified  Stiffness of left knee, not elsewhere classified     Problem List Patient Active Problem List   Diagnosis Date Noted  . Weakness generalized 04/05/2019  . S/P BKA (below knee amputation) unilateral, left ([place]) 02/15/2019  . Pressure injury of skin 02/03/2019  . TIA (transient ischemic attack) 02/03/2019  . Transient speech disturbance 02/02/2019  . Chronic indwelling Foley catheter 01/22/2019  . Postoperative wound infection 01/20/2019  . Normocytic anemia 01/18/2019  . Cellulitis 01/18/2019  . Infection of deep incisional surgical site after procedure   . Abnormal urinalysis   . Labile blood glucose   . Urinary retention   . S/P bilateral BKA (below knee amputation) ([place])   . Acute blood loss anemia    . Urinary retention due to benign prostatic hyperplasia 12/28/2018  . Unilateral complete BKA, left, initial encounter ([place]) 12/25/2018  . Dehiscence of amputation stump ([place])   . History of transmetatarsal amputation of left foot ([place]) 12/09/2018  . Critical limb ischemia with history of revascularization of same extremity   . Gangrene of left foot ([place])   . Diabetic ulcer of toe of left foot associated with type 2 diabetes mellitus ([place])   . Cellulitis of left lower extremity   . Toe infection 11/18/2018  . Peripheral edema   . Acute on chronic diastolic CHF (congestive heart failure) ([place]) 11/09/2018  . S/P BKA (below knee amputation) unilateral, right ([place]) 10/05/2018  . Hyponatremia   . Essential hypertension   . Postoperative pain   . Pain of left heel   . Unable to maintain weight-bearing   . Type 2 diabetes mellitus with diabetic peripheral angiopathy with gangrene ([place])   . Anemia due to acute blood loss   . Chronic kidney disease, stage 3a   . Acquired absence of right leg below knee ([place]) 09/07/2018  . Gangrene of right foot ([place])   . DNR (do not resuscitate) discussion   . Goals of care, counseling/discussion   . Palliative care by specialist   . Type 2 diabetes mellitus with vascular disease ([place])   . Severe protein-calorie malnutrition ([place])   . Ischemic foot 08/20/2018  . Critical lower limb ischemia 08/03/2018  . Type 2 diabetes mellitus with foot ulcer, without long-term current use of insulin ([place])   . Gastroesophageal reflux disease   . Cellulitis of great toe of right foot 05/29/2018  . Atrial fibrillation, chronic   . Chest pain 07/25/2017  . PAF (paroxysmal atrial fibrillation) ([place]) 07/25/2017  . BPH (benign prostatic hyperplasia) 07/25/2017  . Shortness of breath 05/11/2017  . Hypothyroidism 05/11/2017  . Chronic diastolic CHF (congestive heart failure) ([place]) 05/11/2017  . Moderate aortic stenosis 06/12/2016  . RBBB 06/12/2016  . Peripheral vascular  disease ([place]) 08/04/2012  . Essential hypertension, benign 08/04/2012  . Hyperlipidemia 08/04/2012  . Dizziness 08/04/2012    Jamey Reas PT, DPT 05/28/2019, 10:53 AM  [facility] [address] [place], [place], [address] Phone: [phone]   Fax:  [phone]  Name: TWAN HARKIN MRN: [id number] Date of Birth: 05-17-1927

## 2019-05-31 ENCOUNTER — Encounter: Payer: Self-pay | Admitting: Physical Therapy

## 2019-05-31 ENCOUNTER — Other Ambulatory Visit: Payer: Self-pay

## 2019-05-31 ENCOUNTER — Ambulatory Visit (INDEPENDENT_AMBULATORY_CARE_PROVIDER_SITE_OTHER): Payer: Medicare Other | Admitting: Physical Therapy

## 2019-05-31 DIAGNOSIS — Z9181 History of falling: Secondary | ICD-10-CM

## 2019-05-31 DIAGNOSIS — M25662 Stiffness of left knee, not elsewhere classified: Secondary | ICD-10-CM

## 2019-05-31 DIAGNOSIS — R293 Abnormal posture: Secondary | ICD-10-CM

## 2019-05-31 DIAGNOSIS — R2681 Unsteadiness on feet: Secondary | ICD-10-CM | POA: Diagnosis not present

## 2019-05-31 DIAGNOSIS — M25661 Stiffness of right knee, not elsewhere classified: Secondary | ICD-10-CM

## 2019-05-31 DIAGNOSIS — M6249 Contracture of muscle, multiple sites: Secondary | ICD-10-CM

## 2019-05-31 DIAGNOSIS — R2689 Other abnormalities of gait and mobility: Secondary | ICD-10-CM | POA: Diagnosis not present

## 2019-05-31 DIAGNOSIS — R531 Weakness: Secondary | ICD-10-CM | POA: Diagnosis not present

## 2019-05-31 DIAGNOSIS — R600 Localized edema: Secondary | ICD-10-CM

## 2019-05-31 NOTE — Therapy (Signed)
[facility] Physical Therapy [address] [place], [place], [address] Phone: [phone]   Fax:  [phone]  Physical Therapy Treatment  Patient Details  Name: [name] MRN: [id number] Date of Birth: [birth year] Referring Provider (PT): [name], MD   Encounter Date: 05/31/2019  PT End of Session - 05/31/19 1345    Visit Number  17    Number of Visits  50    Date for PT Re-Evaluation  06/30/19    Authorization Type  [employer] Medicare    Authorization Time Period  $30 co-pay    Progress Note Due on Visit  20    PT Start Time  1345    PT Stop Time  1431    PT Time Calculation (min)  46 min    Equipment Utilized During Treatment  Gait belt    Activity Tolerance  Patient tolerated treatment well    Behavior During Therapy  WFL for tasks assessed/performed       Past Medical History:  Diagnosis Date  . Arthritis   . Borderline diabetic   . Diabetes mellitus without complication ([place])   . Glaucoma   . Hyperlipidemia   . Hypertension   . PVD (peripheral vascular disease) ([place])   . Sleep apnea    Cpap ordered but doesnt use  . Urinary retention due to benign prostatic hyperplasia 12/28/2018    Past Surgical History:  Procedure Laterality Date  . ABDOMINAL AORTOGRAM W/LOWER EXTREMITY N/A 07/27/2018   Procedure: ABDOMINAL AORTOGRAM W/LOWER EXTREMITY;  Surgeon: Lorretta Harp, MD;  Location: [place] [facility];  Service: Cardiovascular;  Laterality: N/A;  . AMPUTATION Right 08/28/2018   Procedure: RIGHT BELOW KNEE AMPUTATION;  Surgeon: [name], MD;  Location: [place];  Service: Orthopedics;  Laterality: Right;  . AMPUTATION Left 12/02/2018   Procedure: LEFT TRANSMETATARSAL AMPUTATION AND NEGATIVE PRESSURE WOUND VAC PLACEMENT;  Surgeon: [name], MD;  Location: [place];  Service: Orthopedics;  Laterality: Left;  . AMPUTATION Left 12/18/2018   Procedure: LEFT BELOW KNEE AMPUTATION;  Surgeon: [name], MD;  Location: [place];  Service:  Orthopedics;  Laterality: Left;  . APPENDECTOMY  1943  . BELOW KNEE LEG AMPUTATION Left 12/18/2018  . CATARACT EXTRACTION  2012   x2  . COLONOSCOPY N/A 11/01/2014   Procedure: COLONOSCOPY;  Surgeon: Aviva Signs, MD;  [facility];  Service: Gastroenterology;  Laterality: N/A;  . ESOPHAGOGASTRODUODENOSCOPY N/A 11/01/2014   Procedure: ESOPHAGOGASTRODUODENOSCOPY (EGD);  Surgeon: Aviva Signs, MD;  [facility];  Service: Gastroenterology;  Laterality: N/A;  . HERNIA REPAIR Left   . INGUINAL HERNIA REPAIR Right 03/01/2013   Procedure: RIGHT INGUINAL HERNIORRHAPHY;  Surgeon: Jamesetta So, MD;  Location: [facility];  Service: General;  Laterality: Right;  . INSERTION OF MESH Right 03/01/2013   Procedure: INSERTION OF MESH;  Surgeon: Jamesetta So, MD;  [facility];  Service: General;  Laterality: Right;  . LOWER EXTREMITY ANGIOGRAPHY Right 08/25/2018   Procedure: LOWER EXTREMITY ANGIOGRAPHY;  Surgeon: Wellington Hampshire, MD;  Location: [place] [facility];  Service: Cardiovascular;  Laterality: Right;  . LOWER EXTREMITY ANGIOGRAPHY N/A 11/25/2018   Procedure: LOWER EXTREMITY ANGIOGRAPHY;  Surgeon: Wellington Hampshire, MD;  Location: [place] [facility];  Service: Cardiovascular;  Laterality: N/A;  . NM MYOCAR PERF WALL MOTION  06/05/2010   no significant ischemia  . PERIPHERAL VASCULAR ATHERECTOMY Right 08/03/2018   Procedure: PERIPHERAL VASCULAR ATHERECTOMY;  Surgeon: Lorretta Harp, MD;  Location: [place] [facility];  Service: Cardiovascular;  Laterality: Right;  . PERIPHERAL VASCULAR ATHERECTOMY Left 11/23/2018   Procedure: PERIPHERAL VASCULAR ATHERECTOMY;  Surgeon: Lorretta Harp, MD;  Location: [place] [facility];  Service: Cardiovascular;  Laterality: Left;  sfa with dc balloon  . PERIPHERAL VASCULAR BALLOON ANGIOPLASTY Left 11/25/2018   Procedure: PERIPHERAL VASCULAR BALLOON ANGIOPLASTY;  Surgeon: Wellington Hampshire, MD;  Location: [place] [facility];  Service:  Cardiovascular;  Laterality: Left;  popliteal  . PERIPHERAL VASCULAR INTERVENTION Left 11/25/2018   Procedure: PERIPHERAL VASCULAR INTERVENTION;  Surgeon: Wellington Hampshire, MD;  Location: [place] [facility];  Service: Cardiovascular;  Laterality: Left;  tibial peroneal trunk and peroneal stents   . stent  06/14/2010   left leg  . STUMP REVISION Left 01/20/2019   Procedure: REVISION LEFT BELOW KNEE AMPUTATION;  Surgeon: [name], MD;  Location: [place];  Service: Orthopedics;  Laterality: Left;  . US ECHOCARDIOGRAPHY  01/21/2006   moderate mitral annular ca+, mild MR, AOV moderately sclerotic.    There were no vitals filed for this visit.  Subjective Assessment - 05/31/19 1345    Subjective  He stood with family at sink and worked on weight shifts to right & left.  He is wearing prosthesis most of awake hours without issues.    Patient is accompained by:  Family member   son, [name]   Pertinent History  Bil TTAs, DM2, PAD, HTN, CHF, A-Fib, TIA, CKDst3    Patient Stated Goals  walk with prostheses in home & in/out house. Son would like him to be able to toilet & get in/out bed by himself.    Currently in Pain?  No/denies                        [facility] Adult PT Treatment/Exercise - 05/31/19 1345      Transfers   Transfers  Sit to Stand;Stand to Sit;Squat Pivot Transfers;Lateral/Scoot Transfers;Stand Pivot Transfers    Sit to Stand  3: Mod assist;With upper extremity assist;With armrests;From chair/3-in-1;Other (comment)   to RW being stabilized by PT   Sit to Stand Details  Manual facilitation for weight shifting;Verbal cues for safe use of DME/AE;Verbal cues for technique;Verbal cues for sequencing    Sit to Stand: Patient Percentage  60%    Stand to Sit  4: Min assist;With upper extremity assist;With armrests;To chair/3-in-1;Other (comment)   from RW   Stand to Sit Details (indicate cue type and reason)  Verbal cues for technique;Verbal cues for sequencing;Manual  facilitation for weight shifting;Verbal cues for safe use of DME/AE    Squat Pivot Transfers  3: Mod assist;With upper extremity assistance   w/c <> Nustep with bil. prostheses   Squat Pivot Transfer Details (indicate cue type and reason)  tactile Duncan Dull & verbal cues on weight shift forward over prostheses then pivot.        Ambulation/Gait   Ambulation/Gait  Yes    Ambulation/Gait Assistance  2: Max assist   2 people for 1st two with 90* pivot & only 1 for straight   Ambulation/Gait Assistance Details  manual, tactile & verbal cues on upright posture / looking forward, wt shfit to stance limb and step width. 90* turn to right & to left to position to sit with less physical assist to move LEs back into position.      Ambulation Distance (Feet)  20 Feet   10' X 2 + 90* turn to  position to sit and 20' straight path   Assistive device  Prostheses;Rolling walker    Gait Pattern  Step-to pattern;Decreased step length - right;Decreased step length - left;Decreased hip/knee flexion - right;Decreased hip/knee flexion - left;Right foot flat;Left foot flat;Right flexed knee in stance;Left flexed knee in stance;Shuffle;Lateral hip instability;Trunk flexed;Poor foot clearance - left;Poor foot clearance - right    Ambulation Surface  Level;Indoor    Gait Comments  --      Neuro Re-ed    Neuro Re-ed Details   standing at sink with BUE support: weight shift right/left with pelvis, weight shift forward with pelvis then using UEs to push trunk upright 3 reps 5 second hold.      Knee/Hip Exercises: Aerobic   Nustep  Level 5 BUEs & BLEs 5 min with verbal & manual cues on full ROM   seat 12     Prosthetics   Current prosthetic wear tolerance (days/week)   daily    Current prosthetic wear tolerance (#hours/day)   4-5 hrs 2x/day    Edema  pitting edema    Residual limb condition   no open areas, normal color & temperature,     Education Provided  Proper wear schedule/adjustment;Proper Donning;Skin  check;Residual limb care               PT Short Term Goals - 05/31/19 1434      PT SHORT TERM GOAL #1   Title  Patient's son verbalizes understanding of adjusting ply socks with edema changes.  (All STGs Target Date: 05/28/2019)    Baseline  MET 05/24/2019    Time  4    Period  Weeks    Status  Achieved    Target Date  05/28/19      PT SHORT TERM GOAL #2   Title  Patient tolerates prostheses wear >12 hrs total / day without skin issues.    Baseline  MET 05/31/2019    Time  4    Period  Weeks    Status  Achieved    Target Date  05/28/19      PT SHORT TERM GOAL #3   Title  Sit to / from stand w/c to walker with modA.    Baseline  MET 05/31/2019    Time  4    Period  Weeks    Status  Achieved    Target Date  05/28/19      PT SHORT TERM GOAL #4   Title  Standing balance with walker support 2 minutes & scans right/left, up/down with cervical motion with minA.    Baseline  MET 05/31/2019    Time  4    Period  Weeks    Status  Achieved    Target Date  05/28/19      PT SHORT TERM GOAL #5   Title  Patient ambulates 10' & turns 90* to position to sit with RW & prostheses with maxA (2 people for safety).    Baseline  MET 05/31/2019    Time  4    Period  Weeks    Status  Achieved    Target Date  05/28/19        PT Long Term Goals - 04/19/19 2144      PT LONG TERM GOAL #1   Title  Patient and family verbalize and demonstrate understanding of proper prosthetic care to enable safe utilization of prostheses. (All LTGs Target Date: 09/24/2019)    Time  6    Period  Months    Status  On-going    Target Date  09/24/19      PT LONG TERM GOAL #2   Title  Patient tolerates wear of bilateral prostheses >90% of awake hours without skin issues or limb pain to enable functional potential during his day.    Time  6    Period  Months    Status  On-going    Target Date  09/24/19      PT LONG TERM GOAL #3   Title  Squat pivot transfer bed to w/c or chair to w/c with prostheses  modified independent.    Time  6    Period  Months    Status  On-going    Target Date  09/24/19      PT LONG TERM GOAL #4   Title  Sit to / from stand elevated w/c to walker with prostheses with supervision.    Time  6    Period  Months    Status  On-going    Target Date  09/24/19      PT LONG TERM GOAL #5   Title  Standing balance with walker support with bilateral prostheses static stance for 5 minutes, scanning environment and reaches 2" anteriorly with supervision.    Time  6    Period  Months    Status  On-going    Target Date  09/24/19      PT LONG TERM GOAL #6   Title  Patient ambulates 48' between 2 chairs including turning 90* to position to sit with RW & prostheses with minA from family safely.    Time  6    Period  Months    Status  On-going    Target Date  09/24/19            Plan - 05/31/19 1345    Clinical Impression Statement  Patient met all STGs set for this 30-day period. He improved his gait on straight path to level that PT was able to ambulate without 2nd person assist today for first time.  He improved his ability to turn 90* to position to sit also today.    Personal Factors and Comorbidities  Age;Comorbidity 3+;Fitness;Time since onset of injury/illness/exacerbation    Comorbidities  Bil TTAs, DM2, PAD, HTN, CHF, A-Fib, TIA, CKDst3,    Examination-Activity Limitations  Locomotion Level;Sit;Stand;Toileting;Transfers    Examination-Participation Restrictions  Community Activity    Stability/Clinical Decision Making  Evolving/Moderate complexity    Rehab Potential  Good    PT Frequency  2x / week    PT Duration  Other (comment)   25 weeks (6 months)   PT Treatment/Interventions  ADLs/Self Care Home Management;DME Instruction;Gait training;Stair training;Functional mobility training;Therapeutic activities;Therapeutic exercise;Balance training;Neuromuscular re-education;Patient/family education;Prosthetic Training;Manual techniques;Passive range of  motion;Vestibular    PT Next Visit Plan  work towards updated STGs, transfers for stand pivot, Nustep, standing balance & gait with RW when 2nd person available to assist    Consulted and Agree with Plan of Care  Patient;Family member/caregiver    Family Member Consulted  son, Anton Cheramie       Patient will benefit from skilled therapeutic intervention in order to improve the following deficits and impairments:  Abnormal gait, Cardiopulmonary status limiting activity, Decreased activity tolerance, Decreased balance, Decreased endurance, Decreased knowledge of use of DME, Decreased mobility, Decreased range of motion, Decreased scar mobility, Decreased strength, Increased edema, Impaired flexibility, Impaired UE functional use, Postural dysfunction, Prosthetic Dependency, Pain  Visit Diagnosis:  Unsteadiness on feet  Other abnormalities of gait and mobility  Abnormal posture  Weakness generalized  History of fall  Contracture of muscle, multiple sites  Localized edema  Stiffness of right knee, not elsewhere classified  Stiffness of left knee, not elsewhere classified     Problem List Patient Active Problem List   Diagnosis Date Noted  . Weakness generalized 04/05/2019  . S/P BKA (below knee amputation) unilateral, left ([place]) 02/15/2019  . Pressure injury of skin 02/03/2019  . TIA (transient ischemic attack) 02/03/2019  . Transient speech disturbance 02/02/2019  . Chronic indwelling Foley catheter 01/22/2019  . Postoperative wound infection 01/20/2019  . Normocytic anemia 01/18/2019  . Cellulitis 01/18/2019  . Infection of deep incisional surgical site after procedure   . Abnormal urinalysis   . Labile blood glucose   . Urinary retention   . S/P bilateral BKA (below knee amputation) ([place])   . Acute blood loss anemia   . Urinary retention due to benign prostatic hyperplasia 12/28/2018  . Unilateral complete BKA, left, initial encounter ([place]) 12/25/2018  . Dehiscence of  amputation stump ([place])   . History of transmetatarsal amputation of left foot ([place]) 12/09/2018  . Critical limb ischemia with history of revascularization of same extremity   . Gangrene of left foot ([place])   . Diabetic ulcer of toe of left foot associated with type 2 diabetes mellitus ([place])   . Cellulitis of left lower extremity   . Toe infection 11/18/2018  . Peripheral edema   . Acute on chronic diastolic CHF (congestive heart failure) ([place]) 11/09/2018  . S/P BKA (below knee amputation) unilateral, right ([place]) 10/05/2018  . Hyponatremia   . Essential hypertension   . Postoperative pain   . Pain of left heel   . Unable to maintain weight-bearing   . Type 2 diabetes mellitus with diabetic peripheral angiopathy with gangrene ([place])   . Anemia due to acute blood loss   . Chronic kidney disease, stage 3a   . Acquired absence of right leg below knee ([place]) 09/07/2018  . Gangrene of right foot ([place])   . DNR (do not resuscitate) discussion   . Goals of care, counseling/discussion   . Palliative care by specialist   . Type 2 diabetes mellitus with vascular disease ([place])   . Severe protein-calorie malnutrition ([place])   . Ischemic foot 08/20/2018  . Critical lower limb ischemia 08/03/2018  . Type 2 diabetes mellitus with foot ulcer, without long-term current use of insulin ([place])   . Gastroesophageal reflux disease   . Cellulitis of great toe of right foot 05/29/2018  . Atrial fibrillation, chronic   . Chest pain 07/25/2017  . PAF (paroxysmal atrial fibrillation) ([place]) 07/25/2017  . BPH (benign prostatic hyperplasia) 07/25/2017  . Shortness of breath 05/11/2017  . Hypothyroidism 05/11/2017  . Chronic diastolic CHF (congestive heart failure) ([place]) 05/11/2017  . Moderate aortic stenosis 06/12/2016  . RBBB 06/12/2016  . Peripheral vascular disease ([place]) 08/04/2012  . Essential hypertension, benign 08/04/2012  . Hyperlipidemia 08/04/2012  . Dizziness 08/04/2012    Jamey Reas PT,  DPT 05/31/2019, 10:53 PM  [facility] Physical Therapy [address] [place], [place], [address] Phone: [phone]   Fax:  [phone]  Name: [name] MRN: [id number] Date of Birth: [birth year]

## 2019-06-01 ENCOUNTER — Encounter: Payer: Self-pay | Admitting: Physical Therapy

## 2019-06-01 ENCOUNTER — Ambulatory Visit (INDEPENDENT_AMBULATORY_CARE_PROVIDER_SITE_OTHER): Payer: Medicare Other | Admitting: Physical Therapy

## 2019-06-01 DIAGNOSIS — R2689 Other abnormalities of gait and mobility: Secondary | ICD-10-CM

## 2019-06-01 DIAGNOSIS — R2681 Unsteadiness on feet: Secondary | ICD-10-CM | POA: Diagnosis not present

## 2019-06-01 DIAGNOSIS — R531 Weakness: Secondary | ICD-10-CM | POA: Diagnosis not present

## 2019-06-01 DIAGNOSIS — Z9181 History of falling: Secondary | ICD-10-CM

## 2019-06-01 DIAGNOSIS — M25661 Stiffness of right knee, not elsewhere classified: Secondary | ICD-10-CM

## 2019-06-01 DIAGNOSIS — R600 Localized edema: Secondary | ICD-10-CM

## 2019-06-01 DIAGNOSIS — R293 Abnormal posture: Secondary | ICD-10-CM

## 2019-06-01 DIAGNOSIS — M25662 Stiffness of left knee, not elsewhere classified: Secondary | ICD-10-CM

## 2019-06-01 DIAGNOSIS — M6249 Contracture of muscle, multiple sites: Secondary | ICD-10-CM

## 2019-06-02 ENCOUNTER — Encounter: Payer: Self-pay | Admitting: *Deleted

## 2019-06-02 ENCOUNTER — Other Ambulatory Visit: Payer: Self-pay | Admitting: *Deleted

## 2019-06-02 NOTE — Patient Outreach (Signed)
[facility]) Care Management  [place]  06/02/2019   [name] [birth year] [id number]  Successful telephone outreach call to patient's son [name] for initial assessment (pt is very [facility]). HIPAA identifiers obtained.  Subjective: Son states patient is doing well. Patient continues to go to PT twice weekly and is increasing his physical mobility slowly. Per son his bilateral stumps have healed and his pain is controlled. Son stated that Dr. Sharol Given has released patient unless complications occur. Son states that the patient is doing well emotionally despite going through bilateral BKA. Son explains that the patient is upbeat and that he has many siblings who come by often to socialize. Per son [name], he, his sister, and other brother rotate shifts and are able to care for the patient around the clock. Son reports that the home environment is safe and patient does not need any DME at this time. Son states that the patient's foley catheter was changed about 2 weeks ago at a doctor's visit and shared that a home health nurse has not been arranged to change foley catheter routinely.  [name] states that the patient's diabetes is under control. His FBS today was 192 with ranges of 126-192. Patient's last A1c was 5.6. Son states that patient is maintaining a diabetic and low salt diet. Son stated that the patient's fluid overload has been stable since the provider increased his furosemide dose. [name] did not have any further concerns at this time.   Encounter Medications:  Outpatient Encounter Medications as of 06/02/2019  Medication Sig  . amLODipine (NORVASC) 2.5 MG tablet Take 2.5 mg by mouth daily.   [name] apixaban (ELIQUIS) 2.5 MG TABS tablet Take 2.5 mg by mouth 2 (two) times daily.  [name] aspirin EC 81 MG tablet Take 81 mg by mouth daily.  [name] atorvastatin (LIPITOR) 40 MG tablet TAKE 1 TABLET BY MOUTH EVERY DAY  . clopidogrel (PLAVIX) 75 MG tablet Take 1 tablet (75 mg total) by  mouth daily with breakfast.  . diclofenac Sodium (VOLTAREN) 1 % GEL Apply 2 g topically 4 (four) times daily.  . dorzolamide-timolol (COSOPT) 22.3-6.8 MG/ML ophthalmic solution Place 1 drop into both eyes 2 (two) times daily.  . famotidine (PEPCID) 20 MG tablet Take 1 tablet (20 mg total) by mouth daily.  . ferrous sulfate 325 (65 FE) MG tablet Take 325 mg by mouth 2 (two) times daily with a meal.  . finasteride (PROSCAR) 5 MG tablet Take 1 tablet (5 mg total) by mouth every evening.  . furosemide (LASIX) 40 MG tablet Take 40mg  by mouth in the morning and 20mg  by mouth in the evening.  [name] glipiZIDE (GLUCOTROL) 5 MG tablet Take 5 mg by mouth 2 (two) times daily.   [name] levothyroxine (SYNTHROID) 75 MCG tablet Take 75 mcg by mouth daily.  . methocarbamol (ROBAXIN) 500 MG tablet Take 1 tablet (500 mg total) by mouth every 6 (six) hours as needed for muscle spasms.  . Misc Natural Products (GLUCOSAMINE CHOND MSM FORMULA PO) Take 1 tablet by mouth 2 (two) times daily.  . Omega-3 Fatty Acids (FISH OIL PO) Take 1 capsule by mouth daily.  Glory Rosebush ULTRA test strip 1 each 2 (two) times daily.  . polyethylene glycol (MIRALAX) 17 g packet Take 17 g by mouth daily as needed for moderate constipation.  . potassium chloride (KLOR-CON) 10 MEQ tablet Take 1 tablet (10 mEq total) by mouth daily.  [name] pyridoxine (B-6) 100 MG tablet Take 100 mg by mouth  daily.  . tamsulosin (FLOMAX) 0.4 MG CAPS capsule Take 1 capsule (0.4 mg total) by mouth 2 (two) times daily.  . traMADol (ULTRAM) 50 MG tablet Take 1 tablet (50 mg total) by mouth every 6 (six) hours as needed.  . vitamin C (ASCORBIC ACID) 500 MG tablet Take 500 mg by mouth daily.  . vitamin E 400 UNIT capsule Take 400 Units by mouth daily.   No facility-administered encounter medications on file as of 06/02/2019.    Functional Status:  In your present state of health, do you have any difficulty performing the following activities: 06/02/2019 02/03/2019  Hearing? Y Y   Comment per son but no hearing aid -  Vision? N N  Difficulty concentrating or making decisions? N Y  Walking or climbing stairs? Y Y  Comment Bilateral BKA, family assists -  Dressing or bathing? Y N  Comment Bilateral BKA, family assists -  Doing errands, shopping? Y Y  Comment Bilateral BKA, family assists -  Conservation officer, nature and eating ? Y -  Comment Bilateral BKA, family assists -  Using the Toilet? Y -  Comment Bilateral BKA family assists -  In the past six months, have you accidently leaked urine? Y -  Comment Bilateral BKA -  Do you have problems with loss of bowel control? N -  Managing your Medications? Y -  Comment Daughter assists with pillbox -  Managing your Finances? Y -  Comment family assist -  Housekeeping or managing your Housekeeping? Y -  Comment family assist -  Some recent data might be hidden    Fall/Depression Screening: Fall Risk  06/02/2019 02/15/2019 01/28/2019  Falls in the past year? 1 0 0  Number falls in past yr: 0 - 0  Injury with Fall? 1 - 1  Risk for fall due to : Impaired mobility;Impaired balance/gait;History of fall(s) - Orthopedic patient  Risk for fall due to: Comment bilateral BKA - Bilateral BKAs  Follow up Falls prevention discussed;Education provided;Falls evaluation completed - Falls evaluation completed;Falls prevention discussed   PHQ 2/9 Scores 06/02/2019 01/12/2019  PHQ - 2 Score 1 2  PHQ- 9 Score - 3   THN CM Care Plan Problem One     Most Recent Value  Care Plan Problem One  Diabetes with PVD, recent bilateral BKA  Role Documenting the Problem One  [facility] [place] for Problem One  Active  THN Long Term Goal   Patient will maintain his diabetes control as evidence by his blood sugar data record to keep vascular complications from occuring with his healed stump areas within the next 90 days  THN Long Term Goal Start Date  06/02/19  Interventions for Problem One Long Term Goal  RN discussed importance of monitoring sugar  and counting carbohydrates, encouraged patient to continue with daily FBS CBG monitoring and record data, encouraged medication adherence, discussed that high blood sugar values will cause vascular complications which could create issues with healed stumps, encouraged family to assess integrity of the stump areas daily for any breakdown or discoloration.  THN CM Short Term Goal #1   Patient will not have a CHF exacerbation as evidenced by families/ patients verbalization within the next 30 days  THN CM Short Term Goal #1 Start Date  06/02/19  Interventions for Short Term Goal #1  Nurse discussed with son the importance of monitoring the patient closely for symptoms of CHF due to patient is unable to weigh himsef because of bilateral BKA, discussed  CHF zones and action plans with son, nurse will send CHF packet to patient.     Interventions: Nurse discussed with son that she will work with PCP to get a Home Health nurse to maintain foley catheter. Nurse provided education regarding diabetes vascular complications and education about CHF zones and action plans.   Plan: Nurse will send PCP Barrier Letter and today's assessment note, nurse will contact PCP to arrange Home Health to maintain patient's foley catheter, will send patient CHF zone and action plan education. RN Health Coach will call patient/family within the month of July and patient/family agrees to future outreach calls.   Emelia Loron RN, BSN [place] [phone] [email or web address]

## 2019-06-02 NOTE — Therapy (Signed)
[facility] [address] [place], [place], [address] Phone: [phone]   Fax:  [phone]  Physical Therapy Treatment  Patient Details  Name: [name] MRN: [id number] Date of Birth: 1927-11-19 Referring Provider (PT): [name], MD   Encounter Date: 06/01/2019  PT End of Session - 06/01/19 1345    Visit Number  18    Number of Visits  50    Date for PT Re-Evaluation  06/30/19    Authorization Type  [employer] Medicare    Authorization Time Period  $30 co-pay    Progress Note Due on Visit  20    PT Start Time  1345    PT Stop Time  1430    PT Time Calculation (min)  45 min    Equipment Utilized During Treatment  Gait belt    Activity Tolerance  Patient tolerated treatment well    Behavior During Therapy  [facility] for tasks assessed/performed       Past Medical History:  Diagnosis Date  . Arthritis   . Borderline diabetic   . Diabetes mellitus without complication ([place])   . Glaucoma   . Hyperlipidemia   . Hypertension   . PVD (peripheral vascular disease) ([place])   . Sleep apnea    Cpap ordered but doesnt use  . Urinary retention due to benign prostatic hyperplasia 12/28/2018    Past Surgical History:  Procedure Laterality Date  . ABDOMINAL AORTOGRAM W/LOWER EXTREMITY N/A 07/27/2018   Procedure: ABDOMINAL AORTOGRAM W/LOWER EXTREMITY;  Surgeon: Lorretta Harp, MD;  Location: [place] [facility];  Service: Cardiovascular;  Laterality: N/A;  . AMPUTATION Right 08/28/2018   Procedure: RIGHT BELOW KNEE AMPUTATION;  Surgeon: [name], MD;  Location: [place];  Service: Orthopedics;  Laterality: Right;  . AMPUTATION Left 12/02/2018   Procedure: LEFT TRANSMETATARSAL AMPUTATION AND NEGATIVE PRESSURE WOUND VAC PLACEMENT;  Surgeon: [name], MD;  Location: [place];  Service: Orthopedics;  Laterality: Left;  . AMPUTATION Left 12/18/2018   Procedure: LEFT BELOW KNEE AMPUTATION;  Surgeon: [name], MD;  Location: [place];  Service:  Orthopedics;  Laterality: Left;  . APPENDECTOMY  1943  . BELOW KNEE LEG AMPUTATION Left 12/18/2018  . CATARACT EXTRACTION  2012   x2  . COLONOSCOPY N/A 11/01/2014   Procedure: COLONOSCOPY;  Surgeon: Aviva Signs, MD;  [facility];  Service: Gastroenterology;  Laterality: N/A;  . ESOPHAGOGASTRODUODENOSCOPY N/A 11/01/2014   Procedure: ESOPHAGOGASTRODUODENOSCOPY (EGD);  Surgeon: Aviva Signs, MD;  [facility];  Service: Gastroenterology;  Laterality: N/A;  . HERNIA REPAIR Left   . INGUINAL HERNIA REPAIR Right 03/01/2013   Procedure: RIGHT INGUINAL HERNIORRHAPHY;  Surgeon: Jamesetta So, MD;  [facility]: [facility];  Service: General;  Laterality: Right;  . INSERTION OF MESH Right 03/01/2013   Procedure: INSERTION OF MESH;  Surgeon: Jamesetta So, MD;  [facility];  Service: General;  Laterality: Right;  . LOWER EXTREMITY ANGIOGRAPHY Right 08/25/2018   Procedure: LOWER EXTREMITY ANGIOGRAPHY;  Surgeon: Wellington Hampshire, MD;  Location: [place] [facility];  Service: Cardiovascular;  Laterality: Right;  . LOWER EXTREMITY ANGIOGRAPHY N/A 11/25/2018   Procedure: LOWER EXTREMITY ANGIOGRAPHY;  Surgeon: Wellington Hampshire, MD;  Location: [place] [facility];  Service: Cardiovascular;  Laterality: N/A;  . NM MYOCAR PERF WALL MOTION  06/05/2010   no significant ischemia  . PERIPHERAL VASCULAR ATHERECTOMY Right 08/03/2018   Procedure: PERIPHERAL VASCULAR ATHERECTOMY;  Surgeon: Lorretta Harp, MD;  Location: [place] [facility];  Service: Cardiovascular;  Laterality: Right;  . PERIPHERAL VASCULAR ATHERECTOMY Left 11/23/2018   Procedure: PERIPHERAL VASCULAR ATHERECTOMY;  Surgeon: Lorretta Harp, MD;  Location: [place] [facility];  Service: Cardiovascular;  Laterality: Left;  sfa with dc balloon  . PERIPHERAL VASCULAR BALLOON ANGIOPLASTY Left 11/25/2018   Procedure: PERIPHERAL VASCULAR BALLOON ANGIOPLASTY;  Surgeon: Wellington Hampshire, MD;  Location: [place] [facility];  Service:  Cardiovascular;  Laterality: Left;  popliteal  . PERIPHERAL VASCULAR INTERVENTION Left 11/25/2018   Procedure: PERIPHERAL VASCULAR INTERVENTION;  Surgeon: Wellington Hampshire, MD;  Location: [place] [facility];  Service: Cardiovascular;  Laterality: Left;  tibial peroneal trunk and peroneal stents   . stent  06/14/2010   left leg  . STUMP REVISION Left 01/20/2019   Procedure: REVISION LEFT BELOW KNEE AMPUTATION;  Surgeon: [name], MD;  Location: [place];  Service: Orthopedics;  Laterality: Left;  . US ECHOCARDIOGRAPHY  01/21/2006   moderate mitral annular ca+, mild MR, AOV moderately sclerotic.    There were no vitals filed for this visit.  Subjective Assessment - 06/01/19 1345    Subjective  Family is still using STEADY for transfers at home.    Patient is accompained by:  Family member   son, Conlan Miceli   Pertinent History  Bil TTAs, DM2, PAD, HTN, CHF, A-Fib, TIA, CKDst3    Patient Stated Goals  walk with prostheses in home & in/out house. Son would like him to be able to toilet & get in/out bed by himself.    Currently in Pain?  No/denies                        [facility] Adult PT Treatment/Exercise - 06/01/19 1345      Transfers   Transfers  Sit to Stand;Stand to Sit;Squat Pivot Transfers;Lateral/Scoot Transfers;Stand Pivot Transfers    Sit to Stand  3: Mod assist;With upper extremity assist;With armrests;From chair/3-in-1;Other (comment)   to RW being stabilized by PT   Sit to Stand Details  Manual facilitation for weight shifting;Verbal cues for safe use of DME/AE;Verbal cues for technique;Verbal cues for sequencing    Sit to Stand: Patient Percentage  60%    Stand to Sit  4: Min assist;With upper extremity assist;With armrests;To chair/3-in-1;Other (comment)   from RW   Stand to Sit Details (indicate cue type and reason)  Verbal cues for technique;Verbal cues for sequencing;Manual facilitation for weight shifting;Verbal cues for safe use of DME/AE    Squat Pivot  Transfers  3: Mod assist;With upper extremity assistance   w/c <> Nustep with bil. prostheses     Ambulation/Gait   Ambulation/Gait  Yes    Ambulation/Gait Assistance  2: Max assist   2 people for 1st two with 90* pivot & only 1 for straight   Ambulation Distance (Feet)  20 Feet   10' X 2 + 90* turn to position to sit and 20' straight path   Assistive device  Prostheses;Rolling walker    Gait Pattern  Step-to pattern;Decreased step length - right;Decreased step length - left;Decreased hip/knee flexion - right;Decreased hip/knee flexion - left;Right foot flat;Left foot flat;Right flexed knee in stance;Left flexed knee in stance;Shuffle;Lateral hip instability;Trunk flexed;Poor foot clearance - left;Poor foot clearance - right      Neuro Re-ed    Neuro Re-ed Details   --      Knee/Hip Exercises: Aerobic   Nustep  Level 5 BUEs & BLEs  5 min with verbal & manual cues on full ROM   seat 12     Knee/Hip Exercises: Machines for Strengthening   Cybex Leg Press  Shuttle Leg Press Back at 45* BLEs 75# 15 reps  RLE & LLE single leg press 37# 10 reps and back flat 62# 15 reps.  PT tactile & verbal cues for max range      Prosthetics   Current prosthetic wear tolerance (days/week)   daily    Current prosthetic wear tolerance (#hours/day)   4-5 hrs 2x/day    Edema  pitting edema    Residual limb condition   no open areas, normal color & temperature,     Education Provided  --               PT Short Term Goals - 06/01/19 2130      PT SHORT TERM GOAL #1   Title  Patient & son report transferring at home with sliding board & prostheses with patient participated.  (All updated STGs Target Date: 06/29/2019)    Time  4    Period  Weeks    Status  New    Target Date  06/29/19      PT SHORT TERM GOAL #2   Title  Patient tolerates prostheses wear daily >90% of awake hours without skin issues.    Time  4    Period  Weeks    Status  Revised    Target Date  06/29/19      PT SHORT TERM GOAL  #3   Title  Stand -pivot transfer with RW between w/c & chair with armrests with modA    Time  4    Period  Weeks    Status  New    Target Date  06/29/19      PT SHORT TERM GOAL #4   Title  Standing balance with walker support static 2 minutes with supervision and reaches 10" anteriorly with minA    Time  4    Period  Weeks    Status  Revised    Target Date  06/29/19      PT SHORT TERM GOAL #5   Title  Patient ambulates 20' & turns 90* to position to sit with RW & prostheses with modA (2 people for safety).    Time  4    Period  Weeks    Status  Revised    Target Date  06/29/19        PT Long Term Goals - 04/19/19 2144      PT LONG TERM GOAL #1   Title  Patient and family verbalize and demonstrate understanding of proper prosthetic care to enable safe utilization of prostheses. (All LTGs Target Date: 09/24/2019)    Time  6    Period  Months    Status  On-going    Target Date  09/24/19      PT LONG TERM GOAL #2   Title  Patient tolerates wear of bilateral prostheses >90% of awake hours without skin issues or limb pain to enable functional potential during his day.    Time  6    Period  Months    Status  On-going    Target Date  09/24/19      PT LONG TERM GOAL #3   Title  Squat pivot transfer bed to w/c or chair to w/c with prostheses modified independent.    Time  6    Period  Months  Status  On-going    Target Date  09/24/19      PT LONG TERM GOAL #4   Title  Sit to / from stand elevated w/c to walker with prostheses with supervision.    Time  6    Period  Months    Status  On-going    Target Date  09/24/19      PT LONG TERM GOAL #5   Title  Standing balance with walker support with bilateral prostheses static stance for 5 minutes, scanning environment and reaches 2" anteriorly with supervision.    Time  6    Period  Months    Status  On-going    Target Date  09/24/19      PT LONG TERM GOAL #6   Title  Patient ambulates 80' between 2 chairs including  turning 90* to position to sit with RW & prostheses with minA from family safely.    Time  6    Period  Months    Status  On-going    Target Date  09/24/19            Plan - 06/01/19 1345    Clinical Impression Statement  PT added leg press to program for LE strengthening and he tolerated well.  PT instructed son to transfer with sliding board & prostheses when able to patient participates.    Personal Factors and Comorbidities  Age;Comorbidity 3+;Fitness;Time since onset of injury/illness/exacerbation    Comorbidities  Bil TTAs, DM2, PAD, HTN, CHF, A-Fib, TIA, CKDst3,    Examination-Activity Limitations  Locomotion Level;Sit;Stand;Toileting;Transfers    Examination-Participation Restrictions  Community Activity    Stability/Clinical Decision Making  Evolving/Moderate complexity    Rehab Potential  Good    PT Frequency  2x / week    PT Duration  Other (comment)   25 weeks (6 months)   PT Treatment/Interventions  ADLs/Self Care Home Management;DME Instruction;Gait training;Stair training;Functional mobility training;Therapeutic activities;Therapeutic exercise;Balance training;Neuromuscular re-education;Patient/family education;Prosthetic Training;Manual techniques;Passive range of motion;Vestibular    PT Next Visit Plan  work towards updated STGs, transfers for stand pivot, Nustep, leg press, standing balance & gait with RW when 2nd person available to assist    Consulted and Agree with Plan of Care  Patient;Family member/caregiver    Family Member Consulted  son, Nylan Nevel       Patient will benefit from skilled therapeutic intervention in order to improve the following deficits and impairments:  Abnormal gait, Cardiopulmonary status limiting activity, Decreased activity tolerance, Decreased balance, Decreased endurance, Decreased knowledge of use of DME, Decreased mobility, Decreased range of motion, Decreased scar mobility, Decreased strength, Increased edema, Impaired flexibility,  Impaired UE functional use, Postural dysfunction, Prosthetic Dependency, Pain  Visit Diagnosis: Other abnormalities of gait and mobility  Unsteadiness on feet  Abnormal posture  Weakness generalized  History of fall  Contracture of muscle, multiple sites  Localized edema  Stiffness of right knee, not elsewhere classified  Stiffness of left knee, not elsewhere classified     Problem List Patient Active Problem List   Diagnosis Date Noted  . Weakness generalized 04/05/2019  . S/P BKA (below knee amputation) unilateral, left ([place]) 02/15/2019  . Pressure injury of skin 02/03/2019  . TIA (transient ischemic attack) 02/03/2019  . Transient speech disturbance 02/02/2019  . Chronic indwelling Foley catheter 01/22/2019  . Postoperative wound infection 01/20/2019  . Normocytic anemia 01/18/2019  . Cellulitis 01/18/2019  . Infection of deep incisional surgical site after procedure   . Abnormal urinalysis   .  Labile blood glucose   . Urinary retention   . S/P bilateral BKA (below knee amputation) ([place])   . Acute blood loss anemia   . Urinary retention due to benign prostatic hyperplasia 12/28/2018  . Unilateral complete BKA, left, initial encounter ([place]) 12/25/2018  . Dehiscence of amputation stump ([place])   . History of transmetatarsal amputation of left foot ([place]) 12/09/2018  . Critical limb ischemia with history of revascularization of same extremity   . Gangrene of left foot ([place])   . Diabetic ulcer of toe of left foot associated with type 2 diabetes mellitus ([place])   . Cellulitis of left lower extremity   . Toe infection 11/18/2018  . Peripheral edema   . Acute on chronic diastolic CHF (congestive heart failure) ([place]) 11/09/2018  . S/P BKA (below knee amputation) unilateral, right ([place]) 10/05/2018  . Hyponatremia   . Essential hypertension   . Postoperative pain   . Pain of left heel   . Unable to maintain weight-bearing   . Type 2 diabetes mellitus with diabetic  peripheral angiopathy with gangrene ([place])   . Anemia due to acute blood loss   . Chronic kidney disease, stage 3a   . Acquired absence of right leg below knee ([place]) 09/07/2018  . Gangrene of right foot ([place])   . DNR (do not resuscitate) discussion   . Goals of care, counseling/discussion   . Palliative care by specialist   . Type 2 diabetes mellitus with vascular disease ([place])   . Severe protein-calorie malnutrition ([place])   . Ischemic foot 08/20/2018  . Critical lower limb ischemia 08/03/2018  . Type 2 diabetes mellitus with foot ulcer, without long-term current use of insulin ([place])   . Gastroesophageal reflux disease   . Cellulitis of great toe of right foot 05/29/2018  . Atrial fibrillation, chronic   . Chest pain 07/25/2017  . PAF (paroxysmal atrial fibrillation) ([place]) 07/25/2017  . BPH (benign prostatic hyperplasia) 07/25/2017  . Shortness of breath 05/11/2017  . Hypothyroidism 05/11/2017  . Chronic diastolic CHF (congestive heart failure) ([place]) 05/11/2017  . Moderate aortic stenosis 06/12/2016  . RBBB 06/12/2016  . Peripheral vascular disease ([place]) 08/04/2012  . Essential hypertension, benign 08/04/2012  . Hyperlipidemia 08/04/2012  . Dizziness 08/04/2012    Jamey Reas PT, DPT 06/02/2019, 9:36 PM  [facility] [address] [place], [place], [address] Phone: [phone]   Fax:  [phone]  Name: [name] MRN: [id number] Date of Birth: 01/13/1928

## 2019-06-03 ENCOUNTER — Other Ambulatory Visit: Payer: Self-pay | Admitting: *Deleted

## 2019-06-03 ENCOUNTER — Encounter: Payer: Medicare Other | Admitting: Physical Therapy

## 2019-06-03 NOTE — Patient Outreach (Signed)
[facility]) Care Management  06/03/2019  LINDWOOD MOGEL August 30, 1927 [id number]  Successful telephone outreach call to patient. HIPAA identifiers obtained. Nurse spoke to son [name] regarding foley catheter changes for patient. Nurse informed son that the home health agencies she contacted do not provide services only for monthly catheter changes. Nurse informed son that she called patient's urologist and they will make appointments for foley catheter changes. Nurse was able to make an appointment to get patient's catheter changed on 06/11/19 at 1245 and discussed with son that if this appointment will not work well for the patient's schedule they may call urology and get the appointment changed. Nurse did give son urology's number to call to ask further questions and/or to change the appointment. [name] verbalized understanding and was appreciative for the call and information.   Plan: RN Health Coach will call patient within the month of July and patient/family agrees to future outreach calls.   Emelia Loron RN, BSN [place] [phone] [email or web address]

## 2019-06-07 ENCOUNTER — Ambulatory Visit (INDEPENDENT_AMBULATORY_CARE_PROVIDER_SITE_OTHER): Payer: Medicare Other | Admitting: Physical Therapy

## 2019-06-07 ENCOUNTER — Encounter: Payer: Self-pay | Admitting: Physical Therapy

## 2019-06-07 ENCOUNTER — Other Ambulatory Visit: Payer: Self-pay

## 2019-06-07 DIAGNOSIS — R531 Weakness: Secondary | ICD-10-CM

## 2019-06-07 DIAGNOSIS — R2689 Other abnormalities of gait and mobility: Secondary | ICD-10-CM

## 2019-06-07 DIAGNOSIS — Z9181 History of falling: Secondary | ICD-10-CM | POA: Diagnosis not present

## 2019-06-07 DIAGNOSIS — M6249 Contracture of muscle, multiple sites: Secondary | ICD-10-CM

## 2019-06-07 DIAGNOSIS — R2681 Unsteadiness on feet: Secondary | ICD-10-CM

## 2019-06-07 DIAGNOSIS — M25662 Stiffness of left knee, not elsewhere classified: Secondary | ICD-10-CM

## 2019-06-07 DIAGNOSIS — M25661 Stiffness of right knee, not elsewhere classified: Secondary | ICD-10-CM

## 2019-06-07 DIAGNOSIS — R293 Abnormal posture: Secondary | ICD-10-CM | POA: Diagnosis not present

## 2019-06-07 NOTE — Patient Instructions (Signed)
Access Code: [email or web address] URL: [email or web address] Date: 04/13/2019 Prepared by: [facility] - Outpatient Rehab Neuro  Exercises Supine Bridge - 1-2 x daily - 7 x weekly - 2 sets - 5 reps - 5 seconds hold Supine Heel Slide - 1-2 x daily - 7 x weekly - 2 sets - 5 reps - 5 seconds hold Modified Thomas Stretch - 1-2 x daily - 7 x weekly - 1 sets - 3 reps - 30 seconds hold Hooklying Isometric Hip Abduction Adduction with Belt and Ball - 1 x daily - 7 x weekly - 1 sets - 10 reps - 5 seconds hold Seated Isometric Hip Adduction with Ball - 1 x daily - 7 x weekly - 1 sets - 10 reps - 5 seconds hold Stool Scoots - 1 x daily - 7 x weekly - 1 sets - 10 reps - 5 seconds hold Supine Hip Extension with Towel Roll (BKA) - 1 x daily - 7 x weekly - 1 sets - 10 reps - 5 seconds hold Seated Knee Extension & Flexion - 1 x daily - 7 x weekly - 1 sets - 10 reps - 5 seconds hold

## 2019-06-07 NOTE — Therapy (Signed)
[facility] [address] [place], [place], [address] Phone: [phone]   Fax:  [phone]  Physical Therapy Treatment  Patient Details  Name: [name] MRN: [id number] Date of Birth: 01-Dec-1927 Referring Provider (PT): [name], MD   Encounter Date: 06/07/2019  PT End of Session - 06/07/19 1040    Visit Number  19    Number of Visits  50    Date for PT Re-Evaluation  06/30/19    Authorization Type  [employer] Medicare    Authorization Time Period  $30 co-pay    Progress Note Due on Visit  20    PT Start Time  1041    PT Stop Time  1142    PT Time Calculation (min)  61 min    Equipment Utilized During Treatment  Gait belt    Activity Tolerance  Patient tolerated treatment well    Behavior During Therapy  WFL for tasks assessed/performed       Past Medical History:  Diagnosis Date  . Arthritis   . Borderline diabetic   . Diabetes mellitus without complication ([place])   . Glaucoma   . Hyperlipidemia   . Hypertension   . PVD (peripheral vascular disease) ([place])   . Sleep apnea    Cpap ordered but doesnt use  . Urinary retention due to benign prostatic hyperplasia 12/28/2018    Past Surgical History:  Procedure Laterality Date  . ABDOMINAL AORTOGRAM W/LOWER EXTREMITY N/A 07/27/2018   Procedure: ABDOMINAL AORTOGRAM W/LOWER EXTREMITY;  Surgeon: Lorretta Harp, MD;  Location: [place] [facility];  Service: Cardiovascular;  Laterality: N/A;  . AMPUTATION Right 08/28/2018   Procedure: RIGHT BELOW KNEE AMPUTATION;  Surgeon: [name], MD;  Location: [place];  Service: Orthopedics;  Laterality: Right;  . AMPUTATION Left 12/02/2018   Procedure: LEFT TRANSMETATARSAL AMPUTATION AND NEGATIVE PRESSURE WOUND VAC PLACEMENT;  Surgeon: [name], MD;  Location: [place];  Service: Orthopedics;  Laterality: Left;  . AMPUTATION Left 12/18/2018   Procedure: LEFT BELOW KNEE AMPUTATION;  Surgeon: [name], MD;  Location: [place];  Service:  Orthopedics;  Laterality: Left;  . APPENDECTOMY  1943  . BELOW KNEE LEG AMPUTATION Left 12/18/2018  . CATARACT EXTRACTION  2012   x2  . COLONOSCOPY N/A 11/01/2014   Procedure: COLONOSCOPY;  Surgeon: Aviva Signs, MD;  [facility];  Service: Gastroenterology;  Laterality: N/A;  . ESOPHAGOGASTRODUODENOSCOPY N/A 11/01/2014   Procedure: ESOPHAGOGASTRODUODENOSCOPY (EGD);  Surgeon: Aviva Signs, MD;  [facility];  Service: Gastroenterology;  Laterality: N/A;  . HERNIA REPAIR Left   . INGUINAL HERNIA REPAIR Right 03/01/2013   Procedure: RIGHT INGUINAL HERNIORRHAPHY;  Surgeon: Jamesetta So, MD;  Location: [facility];  Service: General;  Laterality: Right;  . INSERTION OF MESH Right 03/01/2013   Procedure: INSERTION OF MESH;  Surgeon: Jamesetta So, MD;  Location: [facility];  Service: General;  Laterality: Right;  . LOWER EXTREMITY ANGIOGRAPHY Right 08/25/2018   Procedure: LOWER EXTREMITY ANGIOGRAPHY;  Surgeon: Wellington Hampshire, MD;  Location: [place] [facility];  Service: Cardiovascular;  Laterality: Right;  . LOWER EXTREMITY ANGIOGRAPHY N/A 11/25/2018   Procedure: LOWER EXTREMITY ANGIOGRAPHY;  Surgeon: Wellington Hampshire, MD;  Location: [place] [facility];  Service: Cardiovascular;  Laterality: N/A;  . NM MYOCAR PERF WALL MOTION  06/05/2010   no significant ischemia  . PERIPHERAL VASCULAR ATHERECTOMY Right 08/03/2018   Procedure: PERIPHERAL VASCULAR ATHERECTOMY;  Surgeon: Lorretta Harp, MD;  Location: [place] [facility];  Service: Cardiovascular;  Laterality: Right;  . PERIPHERAL VASCULAR ATHERECTOMY Left 11/23/2018   Procedure: PERIPHERAL VASCULAR ATHERECTOMY;  Surgeon: Lorretta Harp, MD;  Location: [place] [facility];  Service: Cardiovascular;  Laterality: Left;  sfa with dc balloon  . PERIPHERAL VASCULAR BALLOON ANGIOPLASTY Left 11/25/2018   Procedure: PERIPHERAL VASCULAR BALLOON ANGIOPLASTY;  Surgeon: Wellington Hampshire, MD;  Location: [place] [facility];  Service:  Cardiovascular;  Laterality: Left;  popliteal  . PERIPHERAL VASCULAR INTERVENTION Left 11/25/2018   Procedure: PERIPHERAL VASCULAR INTERVENTION;  Surgeon: Wellington Hampshire, MD;  Location: [place] [facility];  Service: Cardiovascular;  Laterality: Left;  tibial peroneal trunk and peroneal stents   . stent  06/14/2010   left leg  . STUMP REVISION Left 01/20/2019   Procedure: REVISION LEFT BELOW KNEE AMPUTATION;  Surgeon: [name], MD;  Location: [place];  Service: Orthopedics;  Laterality: Left;  . US ECHOCARDIOGRAPHY  01/21/2006   moderate mitral annular ca+, mild MR, AOV moderately sclerotic.    There were no vitals filed for this visit.  Subjective Assessment - 06/07/19 1041    Subjective  They have been using prostheses for w/c to bed transfers as PT recommended to enable patient to assist with motion.    Patient is accompained by:  Family member   son, Brendan Gadson   Pertinent History  Bil TTAs, DM2, PAD, HTN, CHF, A-Fib, TIA, CKDst3    Patient Stated Goals  walk with prostheses in home & in/out house. Son would like him to be able to toilet & get in/out bed by himself.    Currently in Pain?  No/denies                        [facility] Adult PT Treatment/Exercise - 06/07/19 1040      Transfers   Transfers  Sit to Stand;Stand to Sit;Squat Pivot Transfers;Lateral/Scoot Transfers;Stand Pivot Transfers    Sit to Stand  3: Mod assist;With upper extremity assist;With armrests;From chair/3-in-1;Other (comment)   to RW being stabilized by PT   Sit to Stand Details  Manual facilitation for weight shifting;Verbal cues for safe use of DME/AE;Verbal cues for technique;Verbal cues for sequencing    Sit to Stand: Patient Percentage  60%    Stand to Sit  4: Min assist;With upper extremity assist;With armrests;To chair/3-in-1;Other (comment)   from RW   Stand to Sit Details (indicate cue type and reason)  Verbal cues for technique;Verbal cues for sequencing;Manual facilitation for  weight shifting;Verbal cues for safe use of DME/AE    Squat Pivot Transfers  3: Mod assist;With upper extremity assistance   w/c <> Nustep with bil. prostheses     Ambulation/Gait   Ambulation/Gait  Yes    Ambulation/Gait Assistance  2: Max assist   2 person assist for safety   Ambulation/Gait Assistance Details  manual/tactile & verbal cues on upright posture & wt shift    Ambulation Distance (Feet)  25 Feet   5' + 90* turn and 25'   Assistive device  Prostheses;Rolling walker    Gait Pattern  Step-to pattern;Decreased step length - right;Decreased step length - left;Decreased hip/knee flexion - right;Decreased hip/knee flexion - left;Right foot flat;Left foot flat;Right flexed knee in stance;Left flexed knee in stance;Shuffle;Lateral hip instability;Trunk flexed;Poor foot clearance - left;Poor foot clearance - right    Ambulation Surface  Level;Indoor      Knee/Hip Exercises: Hydrologist  Right;Left;2 reps;20 seconds    Passive Hamstring Stretch Limitations  stretch strap assist on forefoot & PT manual knee ext with lower positioned up on leg press plate.     Hip Flexor Stretch  Right;Left;2 reps;20 seconds    Hip Flexor Stretch Limitations  modified Thomas position with foot on leg press plate to decrease lumbar lordosis. PT manual stretch into hip extension.       Knee/Hip Exercises: Aerobic   Nustep  Level 5 BUEs & BLEs 10 min with verbal & manual cues on full ROM   seat 12     Knee/Hip Exercises: Machines for Strengthening   Cybex Leg Press  Shuttle Leg Press Back at 45* BLEs 75# 15 reps  RLE & LLE single leg press 37# 10 reps and back flat 62# 15 reps.  PT tactile & verbal cues for max range      Prosthetics   Current prosthetic wear tolerance (days/week)   daily    Current prosthetic wear tolerance (#hours/day)   5 hrs 2x/day    Edema  pitting edema    Residual limb condition   no open areas, normal color & temperature,         Medbridge Access  Code: [email or web address] URL: [email or web address] Date: 04/13/2019 Prepared by: [facility]  Exercises Supine Bridge - 1-2 x daily - 7 x weekly - 2 sets - 5 reps - 5 seconds hold Supine Heel Slide - 1-2 x daily - 7 x weekly - 2 sets - 5 reps - 5 seconds hold Modified Thomas Stretch - 1-2 x daily - 7 x weekly - 1 sets - 3 reps - 30 seconds hold Hooklying Isometric Hip Abduction Adduction with Belt and Ball - 1 x daily - 7 x weekly - 1 sets - 10 reps - 5 seconds hold Seated Isometric Hip Adduction with Ball - 1 x daily - 7 x weekly - 1 sets - 10 reps - 5 seconds hold Stool Scoots - 1 x daily - 7 x weekly - 1 sets - 10 reps - 5 seconds hold Supine Hip Extension with Towel Roll (BKA) - 1 x daily - 7 x weekly - 1 sets - 10 reps - 5 seconds hold Seated Knee Extension & Flexion - 1 x daily - 7 x weekly - 1 sets - 10 reps - 5 seconds hold       PT Education - 06/07/19 1145    Education Details  HEP updated Medbridge [id number]  and arthritis limiting prolonged positions.    Person(s) Educated  Patient;Child(ren)    Methods  Explanation;Demonstration;Tactile cues;Verbal cues;Handout    Comprehension  Verbalized understanding;Returned demonstration;Verbal cues required;Tactile cues required;Need further instruction       PT Short Term Goals - 06/01/19 2130      PT SHORT TERM GOAL #1   Title  Patient & son report transferring at home with sliding board & prostheses with patient participated.  (All updated STGs Target Date: 06/29/2019)    Time  4    Period  Weeks    Status  New    Target Date  06/29/19      PT SHORT TERM GOAL #2   Title  Patient tolerates prostheses wear daily >90% of awake hours without skin issues.    Time  4    Period  Weeks    Status  Revised    Target Date  06/29/19      PT SHORT TERM GOAL #3  Title  Stand -pivot transfer with RW between w/c & chair with armrests with modA    Time  4    Period  Weeks    Status  New    Target Date   06/29/19      PT SHORT TERM GOAL #4   Title  Standing balance with walker support static 2 minutes with supervision and reaches 10" anteriorly with minA    Time  4    Period  Weeks    Status  Revised    Target Date  06/29/19      PT SHORT TERM GOAL #5   Title  Patient ambulates 20' & turns 90* to position to sit with RW & prostheses with modA (2 people for safety).    Time  4    Period  Weeks    Status  Revised    Target Date  06/29/19        PT Long Term Goals - 04/19/19 2144      PT LONG TERM GOAL #1   Title  Patient and family verbalize and demonstrate understanding of proper prosthetic care to enable safe utilization of prostheses. (All LTGs Target Date: 09/24/2019)    Time  6    Period  Months    Status  On-going    Target Date  09/24/19      PT LONG TERM GOAL #2   Title  Patient tolerates wear of bilateral prostheses >90% of awake hours without skin issues or limb pain to enable functional potential during his day.    Time  6    Period  Months    Status  On-going    Target Date  09/24/19      PT LONG TERM GOAL #3   Title  Squat pivot transfer bed to w/c or chair to w/c with prostheses modified independent.    Time  6    Period  Months    Status  On-going    Target Date  09/24/19      PT LONG TERM GOAL #4   Title  Sit to / from stand elevated w/c to walker with prostheses with supervision.    Time  6    Period  Months    Status  On-going    Target Date  09/24/19      PT LONG TERM GOAL #5   Title  Standing balance with walker support with bilateral prostheses static stance for 5 minutes, scanning environment and reaches 2" anteriorly with supervision.    Time  6    Period  Months    Status  On-going    Target Date  09/24/19      PT LONG TERM GOAL #6   Title  Patient ambulates 33' between 2 chairs including turning 90* to position to sit with RW & prostheses with minA from family safely.    Time  6    Period  Months    Status  On-going    Target Date   09/24/19            Plan - 06/07/19 1041    Clinical Impression Statement  PT updated HEP and pt/son appear to have a general understanding.  PT continues to work on strength, endurance & flexiblity. PT working improving transfers & gait with bilateral TTA prostheses.    Personal Factors and Comorbidities  Age;Comorbidity 3+;Fitness;Time since onset of injury/illness/exacerbation    Comorbidities  Bil TTAs, DM2, PAD, HTN, CHF, A-Fib, TIA, CKDst3,  Examination-Activity Limitations  Locomotion Level;Sit;Stand;Toileting;Transfers    Examination-Participation Restrictions  Community Activity    Stability/Clinical Decision Making  Evolving/Moderate complexity    Rehab Potential  Good    PT Frequency  2x / week    PT Duration  Other (comment)   25 weeks (6 months)   PT Treatment/Interventions  ADLs/Self Care Home Management;DME Instruction;Gait training;Stair training;Functional mobility training;Therapeutic activities;Therapeutic exercise;Balance training;Neuromuscular re-education;Patient/family education;Prosthetic Training;Manual techniques;Passive range of motion;Vestibular    PT Next Visit Plan  check on updated HEP, work towards updated STGs, transfers for stand pivot, Nustep, leg press, standing balance & gait with RW when 2nd person available to assist    Consulted and Agree with Plan of Care  Patient;Family member/caregiver    Family Member Consulted  son, Roger Kettles       Patient will benefit from skilled therapeutic intervention in order to improve the following deficits and impairments:  Abnormal gait, Cardiopulmonary status limiting activity, Decreased activity tolerance, Decreased balance, Decreased endurance, Decreased knowledge of use of DME, Decreased mobility, Decreased range of motion, Decreased scar mobility, Decreased strength, Increased edema, Impaired flexibility, Impaired UE functional use, Postural dysfunction, Prosthetic Dependency, Pain  Visit Diagnosis: Other  abnormalities of gait and mobility  Unsteadiness on feet  Abnormal posture  Weakness generalized  History of fall  Contracture of muscle, multiple sites  Stiffness of right knee, not elsewhere classified  Stiffness of left knee, not elsewhere classified     Problem List Patient Active Problem List   Diagnosis Date Noted  . Weakness generalized 04/05/2019  . S/P BKA (below knee amputation) unilateral, left ([place]) 02/15/2019  . Pressure injury of skin 02/03/2019  . TIA (transient ischemic attack) 02/03/2019  . Transient speech disturbance 02/02/2019  . Chronic indwelling Foley catheter 01/22/2019  . Postoperative wound infection 01/20/2019  . Normocytic anemia 01/18/2019  . Cellulitis 01/18/2019  . Infection of deep incisional surgical site after procedure   . Abnormal urinalysis   . Labile blood glucose   . Urinary retention   . S/P bilateral BKA (below knee amputation) ([place])   . Acute blood loss anemia   . Urinary retention due to benign prostatic hyperplasia 12/28/2018  . Unilateral complete BKA, left, initial encounter ([place]) 12/25/2018  . Dehiscence of amputation stump ([place])   . History of transmetatarsal amputation of left foot ([place]) 12/09/2018  . Critical limb ischemia with history of revascularization of same extremity   . Gangrene of left foot ([place])   . Diabetic ulcer of toe of left foot associated with type 2 diabetes mellitus ([place])   . Cellulitis of left lower extremity   . Toe infection 11/18/2018  . Peripheral edema   . Acute on chronic diastolic CHF (congestive heart failure) ([place]) 11/09/2018  . S/P BKA (below knee amputation) unilateral, right ([place]) 10/05/2018  . Hyponatremia   . Essential hypertension   . Postoperative pain   . Pain of left heel   . Unable to maintain weight-bearing   . Type 2 diabetes mellitus with diabetic peripheral angiopathy with gangrene ([place])   . Anemia due to acute blood loss   . Chronic kidney disease, stage 3a   .  Acquired absence of right leg below knee ([place]) 09/07/2018  . Gangrene of right foot ([place])   . DNR (do not resuscitate) discussion   . Goals of care, counseling/discussion   . Palliative care by specialist   . Type 2 diabetes mellitus with vascular disease ([place])   . Severe protein-calorie malnutrition ([place])   .  Ischemic foot 08/20/2018  . Critical lower limb ischemia 08/03/2018  . Type 2 diabetes mellitus with foot ulcer, without long-term current use of insulin ([place])   . Gastroesophageal reflux disease   . Cellulitis of great toe of right foot 05/29/2018  . Atrial fibrillation, chronic   . Chest pain 07/25/2017  . PAF (paroxysmal atrial fibrillation) ([place]) 07/25/2017  . BPH (benign prostatic hyperplasia) 07/25/2017  . Shortness of breath 05/11/2017  . Hypothyroidism 05/11/2017  . Chronic diastolic CHF (congestive heart failure) ([place]) 05/11/2017  . Moderate aortic stenosis 06/12/2016  . RBBB 06/12/2016  . Peripheral vascular disease ([place]) 08/04/2012  . Essential hypertension, benign 08/04/2012  . Hyperlipidemia 08/04/2012  . Dizziness 08/04/2012    Jamey Reas PT, DPT 06/07/2019, 7:46 PM  [facility] [address] [place], [place], [address] Phone: [phone]   Fax:  [phone]  Name: [name] MRN: [id number] Date of Birth: Jul 19, 1927

## 2019-06-10 ENCOUNTER — Encounter: Payer: Self-pay | Admitting: Physical Therapy

## 2019-06-10 ENCOUNTER — Other Ambulatory Visit: Payer: Self-pay

## 2019-06-10 ENCOUNTER — Ambulatory Visit (INDEPENDENT_AMBULATORY_CARE_PROVIDER_SITE_OTHER): Payer: Medicare Other | Admitting: Physical Therapy

## 2019-06-10 DIAGNOSIS — R531 Weakness: Secondary | ICD-10-CM

## 2019-06-10 DIAGNOSIS — R2689 Other abnormalities of gait and mobility: Secondary | ICD-10-CM

## 2019-06-10 DIAGNOSIS — R293 Abnormal posture: Secondary | ICD-10-CM | POA: Diagnosis not present

## 2019-06-10 DIAGNOSIS — M6249 Contracture of muscle, multiple sites: Secondary | ICD-10-CM | POA: Diagnosis not present

## 2019-06-10 DIAGNOSIS — R2681 Unsteadiness on feet: Secondary | ICD-10-CM

## 2019-06-10 DIAGNOSIS — M25662 Stiffness of left knee, not elsewhere classified: Secondary | ICD-10-CM

## 2019-06-10 DIAGNOSIS — M25661 Stiffness of right knee, not elsewhere classified: Secondary | ICD-10-CM

## 2019-06-10 NOTE — Therapy (Signed)
[facility] [address] [place], [place], [address] Phone: [phone]   Fax:  [phone]  Physical Therapy Treatment / 10th Visit Progress Note  Patient Details  Name: [name] MRN: [id number] Date of Birth: 03-30-1927 Referring Provider (PT): [name], MD   Encounter Date: 06/10/2019   Progress Note Reporting Period 05/11/2019 to 06/10/2019  See note below for Objective Data and Assessment of Progress/Goals.       PT End of Session - 06/10/19 0935    Visit Number  20    Number of Visits  50    Date for PT Re-Evaluation  06/30/19    Authorization Type  [employer] Medicare    Authorization Time Period  $30 co-pay    Progress Note Due on Visit  20    PT Start Time  0930    PT Stop Time  1020    PT Time Calculation (min)  50 min    Equipment Utilized During Treatment  Gait belt    Activity Tolerance  Patient tolerated treatment well    Behavior During Therapy  WFL for tasks assessed/performed       Past Medical History:  Diagnosis Date  . Arthritis   . Borderline diabetic   . Diabetes mellitus without complication ([place])   . Glaucoma   . Hyperlipidemia   . Hypertension   . PVD (peripheral vascular disease) ([place])   . Sleep apnea    Cpap ordered but doesnt use  . Urinary retention due to benign prostatic hyperplasia 12/28/2018    Past Surgical History:  Procedure Laterality Date  . ABDOMINAL AORTOGRAM W/LOWER EXTREMITY N/A 07/27/2018   Procedure: ABDOMINAL AORTOGRAM W/LOWER EXTREMITY;  Surgeon: Lorretta Harp, MD;  Location: [place] [facility];  Service: Cardiovascular;  Laterality: N/A;  . AMPUTATION Right 08/28/2018   Procedure: RIGHT BELOW KNEE AMPUTATION;  Surgeon: [name], MD;  Location: [place];  Service: Orthopedics;  Laterality: Right;  . AMPUTATION Left 12/02/2018   Procedure: LEFT TRANSMETATARSAL AMPUTATION AND NEGATIVE PRESSURE WOUND VAC PLACEMENT;  Surgeon: [name], MD;  Location: [place];  Service:  Orthopedics;  Laterality: Left;  . AMPUTATION Left 12/18/2018   Procedure: LEFT BELOW KNEE AMPUTATION;  Surgeon: [name], MD;  Location: [place];  Service: Orthopedics;  Laterality: Left;  . APPENDECTOMY  1943  . BELOW KNEE LEG AMPUTATION Left 12/18/2018  . CATARACT EXTRACTION  2012   x2  . COLONOSCOPY N/A 11/01/2014   Procedure: COLONOSCOPY;  Surgeon: Aviva Signs, MD;  [facility];  Service: Gastroenterology;  Laterality: N/A;  . ESOPHAGOGASTRODUODENOSCOPY N/A 11/01/2014   Procedure: ESOPHAGOGASTRODUODENOSCOPY (EGD);  Surgeon: Aviva Signs, MD;  [facility];  Service: Gastroenterology;  Laterality: N/A;  . HERNIA REPAIR Left   . INGUINAL HERNIA REPAIR Right 03/01/2013   Procedure: RIGHT INGUINAL HERNIORRHAPHY;  Surgeon: Jamesetta So, MD;  Location: [facility];  Service: General;  Laterality: Right;  . INSERTION OF MESH Right 03/01/2013   Procedure: INSERTION OF MESH;  Surgeon: Jamesetta So, MD;  [facility];  Service: General;  Laterality: Right;  . LOWER EXTREMITY ANGIOGRAPHY Right 08/25/2018   Procedure: LOWER EXTREMITY ANGIOGRAPHY;  Surgeon: Wellington Hampshire, MD;  Location: [place] [facility];  Service: Cardiovascular;  Laterality: Right;  . LOWER EXTREMITY ANGIOGRAPHY N/A 11/25/2018   Procedure: LOWER EXTREMITY ANGIOGRAPHY;  Surgeon: Wellington Hampshire, MD;  Location: [place] [facility];  Service: Cardiovascular;  Laterality: N/A;  . NM MYOCAR  PERF WALL MOTION  06/05/2010   no significant ischemia  . PERIPHERAL VASCULAR ATHERECTOMY Right 08/03/2018   Procedure: PERIPHERAL VASCULAR ATHERECTOMY;  Surgeon: Lorretta Harp, MD;  Location: [place] [facility];  Service: Cardiovascular;  Laterality: Right;  . PERIPHERAL VASCULAR ATHERECTOMY Left 11/23/2018   Procedure: PERIPHERAL VASCULAR ATHERECTOMY;  Surgeon: Lorretta Harp, MD;  Location: [place] [facility];  Service: Cardiovascular;  Laterality: Left;  sfa with dc balloon  . PERIPHERAL VASCULAR BALLOON  ANGIOPLASTY Left 11/25/2018   Procedure: PERIPHERAL VASCULAR BALLOON ANGIOPLASTY;  Surgeon: Wellington Hampshire, MD;  Location: [place] [facility];  Service: Cardiovascular;  Laterality: Left;  popliteal  . PERIPHERAL VASCULAR INTERVENTION Left 11/25/2018   Procedure: PERIPHERAL VASCULAR INTERVENTION;  Surgeon: Wellington Hampshire, MD;  Location: [place] [facility];  Service: Cardiovascular;  Laterality: Left;  tibial peroneal trunk and peroneal stents   . stent  06/14/2010   left leg  . STUMP REVISION Left 01/20/2019   Procedure: REVISION LEFT BELOW KNEE AMPUTATION;  Surgeon: [name], MD;  Location: [place];  Service: Orthopedics;  Laterality: Left;  . US ECHOCARDIOGRAPHY  01/21/2006   moderate mitral annular ca+, mild MR, AOV moderately sclerotic.    There were no vitals filed for this visit.  Subjective Assessment - 06/10/19 0930    Subjective  No new issues.    Patient is accompained by:  Family member   son, Vyron Fronczak   Pertinent History  Bil TTAs, DM2, PAD, HTN, CHF, A-Fib, TIA, CKDst3    Patient Stated Goals  walk with prostheses in home & in/out house. Son would like him to be able to toilet & get in/out bed by himself.    Currently in Pain?  No/denies                        [facility] Adult PT Treatment/Exercise - 06/10/19 0930      Transfers   Transfers  Sit to Stand;Stand to Sit;Squat Pivot Transfers;Lateral/Scoot Transfers;Stand Pivot Transfers    Sit to Stand  3: Mod assist;With upper extremity assist;With armrests;From chair/3-in-1;Other (comment)   to RW being stabilized by PT   Sit to Stand Details  Manual facilitation for weight shifting;Verbal cues for safe use of DME/AE;Verbal cues for technique;Verbal cues for sequencing    Sit to Stand: Patient Percentage  60%    Stand to Sit  4: Min assist;With upper extremity assist;With armrests;To chair/3-in-1;Other (comment)   from RW   Stand to Sit Details (indicate cue type and reason)  Verbal cues for  technique;Verbal cues for sequencing;Manual facilitation for weight shifting;Verbal cues for safe use of DME/AE    Squat Pivot Transfers  3: Mod assist;With upper extremity assistance   w/c <> Nustep with bil. prostheses   Squat Pivot Transfer Details (indicate cue type and reason)  Patient peforms more of transfer with level or lower surface than 2" higher like w/c to Nustep      Ambulation/Gait   Ambulation/Gait  Yes    Ambulation/Gait Assistance  2: Max assist   2 person assist for safety   Ambulation/Gait Assistance Details  manual/tactile & verbal cues on upright posture & wt shift    Ambulation Distance (Feet)  32 Feet   5' + 90* turn and 32'   Assistive device  Prostheses;Rolling walker    Gait Pattern  Step-to pattern;Decreased step length - right;Decreased step length - left;Decreased hip/knee flexion - right;Decreased hip/knee flexion - left;Right foot  flat;Left foot flat;Right flexed knee in stance;Left flexed knee in stance;Shuffle;Lateral hip instability;Trunk flexed;Poor foot clearance - left;Poor foot clearance - right    Ambulation Surface  Level;Indoor      Therapeutic Activites    Therapeutic Activities  Other Therapeutic Activities    Other Therapeutic Activities  seated in w/c at edge so no back support & armrests flipped back and prosthetic feet / heels on ground:  golf swing 5 reps.        Knee/Hip Exercises: Stretches   Passive Hamstring Stretch  Right;Left;2 reps;20 seconds    Passive Hamstring Stretch Limitations  stretch strap assist on forefoot & PT manual knee ext with lower positioned up on leg press plate.     Hip Flexor Stretch  Right;Left;2 reps;20 seconds    Hip Flexor Stretch Limitations  modified Thomas position with foot on leg press plate to decrease lumbar lordosis. PT manual stretch into hip extension.       Knee/Hip Exercises: Aerobic   Nustep  Level 5 BUEs & BLEs 5 min with verbal & manual cues on full ROM   seat 12     Knee/Hip Exercises:  Machines for Strengthening   Cybex Leg Press  Shuttle Leg Press Back at 45* BLEs 75# 15 reps  RLE & LLE single leg press 37# 10 reps ea and back flat 68# 15 reps.  PT tactile & verbal cues for max range      Prosthetics   Current prosthetic wear tolerance (days/week)   daily    Current prosthetic wear tolerance (#hours/day)   5-6 hrs 2x/day    Edema  pitting edema    Residual limb condition   no open areas, normal color & temperature,                PT Short Term Goals - 06/10/19 1038      PT SHORT TERM GOAL #1   Title  Patient & son report transferring at home with sliding board & prostheses with patient participated.  (All updated STGs Target Date: 06/29/2019)    Time  4    Period  Weeks    Status  On-going    Target Date  06/29/19      PT SHORT TERM GOAL #2   Title  Patient tolerates prostheses wear daily >90% of awake hours without skin issues.    Time  4    Period  Weeks    Status  On-going    Target Date  06/29/19      PT SHORT TERM GOAL #3   Title  Stand -pivot transfer with RW between w/c & chair with armrests with modA    Time  4    Period  Weeks    Status  On-going    Target Date  06/29/19      PT SHORT TERM GOAL #4   Title  Standing balance with walker support static 2 minutes with supervision and reaches 10" anteriorly with minA    Time  4    Period  Weeks    Status  On-going    Target Date  06/29/19      PT SHORT TERM GOAL #5   Title  Patient ambulates 20' & turns 90* to position to sit with RW & prostheses with modA (2 people for safety).    Time  4    Period  Weeks    Status  On-going    Target Date  06/29/19  PT Long Term Goals - 04/19/19 2144      PT LONG TERM GOAL #1   Title  Patient and family verbalize and demonstrate understanding of proper prosthetic care to enable safe utilization of prostheses. (All LTGs Target Date: 09/24/2019)    Time  6    Period  Months    Status  On-going    Target Date  09/24/19      PT LONG TERM  GOAL #2   Title  Patient tolerates wear of bilateral prostheses >90% of awake hours without skin issues or limb pain to enable functional potential during his day.    Time  6    Period  Months    Status  On-going    Target Date  09/24/19      PT LONG TERM GOAL #3   Title  Squat pivot transfer bed to w/c or chair to w/c with prostheses modified independent.    Time  6    Period  Months    Status  On-going    Target Date  09/24/19      PT LONG TERM GOAL #4   Title  Sit to / from stand elevated w/c to walker with prostheses with supervision.    Time  6    Period  Months    Status  On-going    Target Date  09/24/19      PT LONG TERM GOAL #5   Title  Standing balance with walker support with bilateral prostheses static stance for 5 minutes, scanning environment and reaches 2" anteriorly with supervision.    Time  6    Period  Months    Status  On-going    Target Date  09/24/19      PT LONG TERM GOAL #6   Title  Patient ambulates 52' between 2 chairs including turning 90* to position to sit with RW & prostheses with minA from family safely.    Time  6    Period  Months    Status  On-going    Target Date  09/24/19            Plan - 06/10/19 0930    Clinical Impression Statement  Patient is improving with prosthetic gait & balance but slow pace due to age, time of immobility / deconditioning and chronic medical issues. PT session worked on strength, endurance and flexibility. PT added seated golf swing to factilitate trunk stabilization in fun way as patient likes golf.    Personal Factors and Comorbidities  Age;Comorbidity 3+;Fitness;Time since onset of injury/illness/exacerbation    Comorbidities  Bil TTAs, DM2, PAD, HTN, CHF, A-Fib, TIA, CKDst3,    Examination-Activity Limitations  Locomotion Level;Sit;Stand;Toileting;Transfers    Examination-Participation Restrictions  Community Activity    Stability/Clinical Decision Making  Evolving/Moderate complexity    Rehab  Potential  Good    PT Frequency  2x / week    PT Duration  Other (comment)   25 weeks (6 months)   PT Treatment/Interventions  ADLs/Self Care Home Management;DME Instruction;Gait training;Stair training;Functional mobility training;Therapeutic activities;Therapeutic exercise;Balance training;Neuromuscular re-education;Patient/family education;Prosthetic Training;Manual techniques;Passive range of motion;Vestibular    PT Next Visit Plan  work towards updated STGs, transfers for stand pivot, Nustep, leg press, standing balance & gait with RW when 2nd person available to assist, golf swings seated on mat table with patient in higher position    Consulted and Agree with Plan of Care  Patient;Family member/caregiver    Family Member Consulted  son, Jaheem [place]path  Patient will benefit from skilled therapeutic intervention in order to improve the following deficits and impairments:  Abnormal gait, Cardiopulmonary status limiting activity, Decreased activity tolerance, Decreased balance, Decreased endurance, Decreased knowledge of use of DME, Decreased mobility, Decreased range of motion, Decreased scar mobility, Decreased strength, Increased edema, Impaired flexibility, Impaired UE functional use, Postural dysfunction, Prosthetic Dependency, Pain  Visit Diagnosis: Other abnormalities of gait and mobility  Unsteadiness on feet  Abnormal posture  Weakness generalized  Contracture of muscle, multiple sites  Stiffness of right knee, not elsewhere classified  Stiffness of left knee, not elsewhere classified     Problem List Patient Active Problem List   Diagnosis Date Noted  . Weakness generalized 04/05/2019  . S/P BKA (below knee amputation) unilateral, left ([place]) 02/15/2019  . Pressure injury of skin 02/03/2019  . TIA (transient ischemic attack) 02/03/2019  . Transient speech disturbance 02/02/2019  . Chronic indwelling Foley catheter 01/22/2019  . Postoperative wound infection  01/20/2019  . Normocytic anemia 01/18/2019  . Cellulitis 01/18/2019  . Infection of deep incisional surgical site after procedure   . Abnormal urinalysis   . Labile blood glucose   . Urinary retention   . S/P bilateral BKA (below knee amputation) ([place])   . Acute blood loss anemia   . Urinary retention due to benign prostatic hyperplasia 12/28/2018  . Unilateral complete BKA, left, initial encounter ([place]) 12/25/2018  . Dehiscence of amputation stump ([place])   . History of transmetatarsal amputation of left foot ([place]) 12/09/2018  . Critical limb ischemia with history of revascularization of same extremity   . Gangrene of left foot ([place])   . Diabetic ulcer of toe of left foot associated with type 2 diabetes mellitus ([place])   . Cellulitis of left lower extremity   . Toe infection 11/18/2018  . Peripheral edema   . Acute on chronic diastolic CHF (congestive heart failure) ([place]) 11/09/2018  . S/P BKA (below knee amputation) unilateral, right ([place]) 10/05/2018  . Hyponatremia   . Essential hypertension   . Postoperative pain   . Pain of left heel   . Unable to maintain weight-bearing   . Type 2 diabetes mellitus with diabetic peripheral angiopathy with gangrene ([place])   . Anemia due to acute blood loss   . Chronic kidney disease, stage 3a   . Acquired absence of right leg below knee ([place]) 09/07/2018  . Gangrene of right foot ([place])   . DNR (do not resuscitate) discussion   . Goals of care, counseling/discussion   . Palliative care by specialist   . Type 2 diabetes mellitus with vascular disease ([place])   . Severe protein-calorie malnutrition ([place])   . Ischemic foot 08/20/2018  . Critical lower limb ischemia 08/03/2018  . Type 2 diabetes mellitus with foot ulcer, without long-term current use of insulin ([place])   . Gastroesophageal reflux disease   . Cellulitis of great toe of right foot 05/29/2018  . Atrial fibrillation, chronic   . Chest pain 07/25/2017  . PAF (paroxysmal atrial  fibrillation) ([place]) 07/25/2017  . BPH (benign prostatic hyperplasia) 07/25/2017  . Shortness of breath 05/11/2017  . Hypothyroidism 05/11/2017  . Chronic diastolic CHF (congestive heart failure) ([place]) 05/11/2017  . Moderate aortic stenosis 06/12/2016  . RBBB 06/12/2016  . Peripheral vascular disease ([place]) 08/04/2012  . Essential hypertension, benign 08/04/2012  . Hyperlipidemia 08/04/2012  . Dizziness 08/04/2012    Jamey Reas PT, DPT 06/10/2019, 10:43 AM  [facility] [address] [place], [place], [address]  Phone: [phone]   Fax:  [phone]  Name: [name] MRN: [id number] Date of Birth: 08/12/1927

## 2019-06-14 DIAGNOSIS — N183 Chronic kidney disease, stage 3 unspecified: Secondary | ICD-10-CM | POA: Diagnosis not present

## 2019-06-14 DIAGNOSIS — I129 Hypertensive chronic kidney disease with stage 1 through stage 4 chronic kidney disease, or unspecified chronic kidney disease: Secondary | ICD-10-CM | POA: Diagnosis not present

## 2019-06-14 DIAGNOSIS — I4891 Unspecified atrial fibrillation: Secondary | ICD-10-CM | POA: Diagnosis not present

## 2019-06-14 DIAGNOSIS — E1122 Type 2 diabetes mellitus with diabetic chronic kidney disease: Secondary | ICD-10-CM | POA: Diagnosis not present

## 2019-06-15 ENCOUNTER — Encounter: Payer: Self-pay | Admitting: Physical Therapy

## 2019-06-15 ENCOUNTER — Ambulatory Visit (INDEPENDENT_AMBULATORY_CARE_PROVIDER_SITE_OTHER): Payer: Medicare Other | Admitting: Physical Therapy

## 2019-06-15 ENCOUNTER — Other Ambulatory Visit: Payer: Self-pay

## 2019-06-15 DIAGNOSIS — M25662 Stiffness of left knee, not elsewhere classified: Secondary | ICD-10-CM

## 2019-06-15 DIAGNOSIS — R293 Abnormal posture: Secondary | ICD-10-CM

## 2019-06-15 DIAGNOSIS — R2689 Other abnormalities of gait and mobility: Secondary | ICD-10-CM

## 2019-06-15 DIAGNOSIS — R2681 Unsteadiness on feet: Secondary | ICD-10-CM | POA: Diagnosis not present

## 2019-06-15 DIAGNOSIS — M6249 Contracture of muscle, multiple sites: Secondary | ICD-10-CM | POA: Diagnosis not present

## 2019-06-15 DIAGNOSIS — R531 Weakness: Secondary | ICD-10-CM

## 2019-06-15 DIAGNOSIS — M25661 Stiffness of right knee, not elsewhere classified: Secondary | ICD-10-CM

## 2019-06-15 NOTE — Therapy (Signed)
[facility] [address] [place], [place], [address] Phone: [phone]   Fax:  [phone]  Physical Therapy Treatment  Patient Details  Name: CORDON GASSETT MRN: [id number] Date of Birth: 10-Jan-1928 Referring Provider (PT): [name], MD   Encounter Date: 06/15/2019  PT End of Session - 06/15/19 1358    Visit Number  21    Number of Visits  50    Date for PT Re-Evaluation  06/30/19    Authorization Type  [employer] Medicare    Authorization Time Period  $30 co-pay    Progress Note Due on Visit  30    PT Start Time  1147    PT Stop Time  1230    PT Time Calculation (min)  43 min    Equipment Utilized During Treatment  Gait belt    Activity Tolerance  Patient tolerated treatment well    Behavior During Therapy  WFL for tasks assessed/performed       Past Medical History:  Diagnosis Date  . Arthritis   . Borderline diabetic   . Diabetes mellitus without complication ([place])   . Glaucoma   . Hyperlipidemia   . Hypertension   . PVD (peripheral vascular disease) ([place])   . Sleep apnea    Cpap ordered but doesnt use  . Urinary retention due to benign prostatic hyperplasia 12/28/2018    Past Surgical History:  Procedure Laterality Date  . ABDOMINAL AORTOGRAM W/LOWER EXTREMITY N/A 07/27/2018   Procedure: ABDOMINAL AORTOGRAM W/LOWER EXTREMITY;  Surgeon: Lorretta Harp, MD;  Location: [place] [facility];  Service: Cardiovascular;  Laterality: N/A;  . AMPUTATION Right 08/28/2018   Procedure: RIGHT BELOW KNEE AMPUTATION;  Surgeon: [name], MD;  Location: [place];  Service: Orthopedics;  Laterality: Right;  . AMPUTATION Left 12/02/2018   Procedure: LEFT TRANSMETATARSAL AMPUTATION AND NEGATIVE PRESSURE WOUND VAC PLACEMENT;  Surgeon: [name], MD;  Location: [place];  Service: Orthopedics;  Laterality: Left;  . AMPUTATION Left 12/18/2018   Procedure: LEFT BELOW KNEE AMPUTATION;  Surgeon: [name], MD;  Location: [place];  Service:  Orthopedics;  Laterality: Left;  . APPENDECTOMY  1943  . BELOW KNEE LEG AMPUTATION Left 12/18/2018  . CATARACT EXTRACTION  2012   x2  . COLONOSCOPY N/A 11/01/2014   Procedure: COLONOSCOPY;  Surgeon: Aviva Signs, MD;  [facility];  Service: Gastroenterology;  Laterality: N/A;  . ESOPHAGOGASTRODUODENOSCOPY N/A 11/01/2014   Procedure: ESOPHAGOGASTRODUODENOSCOPY (EGD);  Surgeon: Aviva Signs, MD;  [facility];  Service: Gastroenterology;  Laterality: N/A;  . HERNIA REPAIR Left   . INGUINAL HERNIA REPAIR Right 03/01/2013   Procedure: RIGHT INGUINAL HERNIORRHAPHY;  Surgeon: Jamesetta So, MD;  Location: [facility];  Service: General;  Laterality: Right;  . INSERTION OF MESH Right 03/01/2013   Procedure: INSERTION OF MESH;  Surgeon: Jamesetta So, MD;  Location: [facility];  Service: General;  Laterality: Right;  . LOWER EXTREMITY ANGIOGRAPHY Right 08/25/2018   Procedure: LOWER EXTREMITY ANGIOGRAPHY;  Surgeon: Wellington Hampshire, MD;  Location: [place] [facility];  Service: Cardiovascular;  Laterality: Right;  . LOWER EXTREMITY ANGIOGRAPHY N/A 11/25/2018   Procedure: LOWER EXTREMITY ANGIOGRAPHY;  Surgeon: Wellington Hampshire, MD;  Location: [place] [facility];  Service: Cardiovascular;  Laterality: N/A;  . NM MYOCAR PERF WALL MOTION  06/05/2010   no significant ischemia  . PERIPHERAL VASCULAR ATHERECTOMY Right 08/03/2018   Procedure: PERIPHERAL VASCULAR ATHERECTOMY;  Surgeon: Lorretta Harp, MD;  Location: [place] [facility];  Service: Cardiovascular;  Laterality: Right;  . PERIPHERAL VASCULAR ATHERECTOMY Left 11/23/2018   Procedure: PERIPHERAL VASCULAR ATHERECTOMY;  Surgeon: Lorretta Harp, MD;  Location: [place] [facility];  Service: Cardiovascular;  Laterality: Left;  sfa with dc balloon  . PERIPHERAL VASCULAR BALLOON ANGIOPLASTY Left 11/25/2018   Procedure: PERIPHERAL VASCULAR BALLOON ANGIOPLASTY;  Surgeon: Wellington Hampshire, MD;  Location: [place] [facility];  Service:  Cardiovascular;  Laterality: Left;  popliteal  . PERIPHERAL VASCULAR INTERVENTION Left 11/25/2018   Procedure: PERIPHERAL VASCULAR INTERVENTION;  Surgeon: Wellington Hampshire, MD;  Location: [place] [facility];  Service: Cardiovascular;  Laterality: Left;  tibial peroneal trunk and peroneal stents   . stent  06/14/2010   left leg  . STUMP REVISION Left 01/20/2019   Procedure: REVISION LEFT BELOW KNEE AMPUTATION;  Surgeon: [name], MD;  Location: [place];  Service: Orthopedics;  Laterality: Left;  . US ECHOCARDIOGRAPHY  01/21/2006   moderate mitral annular ca+, mild MR, AOV moderately sclerotic.    There were no vitals filed for this visit.  Subjective Assessment - 06/15/19 1350    Subjective  No new issues with prosthesis, but feels stiff today    Patient is accompained by:  Family member   son, Eshaan Titzer   Pertinent History  Bil TTAs, DM2, PAD, HTN, CHF, A-Fib, TIA, CKDst3    Patient Stated Goals  walk with prostheses in home & in/out house. Son would like him to be able to toilet & get in/out bed by himself.    Currently in Pain?  No/denies       [facility] Adult PT Treatment/Exercise - 06/15/19 0001      Transfers   Transfers  Sit to Stand;Stand to Sit;Squat Pivot Transfers;Lateral/Scoot Transfers;Stand Pivot Transfers    Sit to Stand  3: Mod assist;With upper extremity assist;With armrests;From chair/3-in-1;Other (comment)   to RW being stabilized by PT   Sit to Stand Details  Manual facilitation for weight shifting;Verbal cues for safe use of DME/AE;Verbal cues for technique;Verbal cues for sequencing    Sit to Stand: Patient Percentage  60%    Stand to Sit  4: Min assist;With upper extremity assist;With armrests;To chair/3-in-1;Other (comment)   from RW   Stand to Sit Details (indicate cue type and reason)  Verbal cues for technique;Verbal cues for sequencing;Manual facilitation for weight shifting;Verbal cues for safe use of DME/AE    Squat Pivot Transfers  3: Mod assist;With  upper extremity assistance   w/c <> Nustep with bil. prostheses     Ambulation/Gait   Ambulation/Gait  Yes    Ambulation/Gait Assistance  2: Max assist   2 person assist for safety   Ambulation Distance (Feet)  32 Feet   5' + 90* turn X 2 and 32 ft X 2, 15 ft X 1   Assistive device  Prostheses;Rolling walker    Gait Pattern  Step-to pattern;Decreased step length - right;Decreased step length - left;Decreased hip/knee flexion - right;Decreased hip/knee flexion - left;Right foot flat;Left foot flat;Right flexed knee in stance;Left flexed knee in stance;Shuffle;Lateral hip instability;Trunk flexed;Poor foot clearance - left;Poor foot clearance - right    Ambulation Surface  Level;Indoor      Therapeutic Activites    Therapeutic Activities  --    Other Therapeutic Activities  --      Knee/Hip Exercises: Stretches   Passive Hamstring Stretch  Right;Left;2 reps;20 seconds    Passive Hamstring Stretch Limitations  stretch strap assist  on forefoot & PT manual knee ext with lower positioned up on leg press plate.     Hip Flexor Stretch  --    Hip Flexor Stretch Limitations  --      Knee/Hip Exercises: Aerobic   Nustep  Level 5 BUEs & BLEs 8 min with verbal & manual cues on full ROM   seat 12     Knee/Hip Exercises: Machines for Strengthening   Cybex Leg Press  Shuttle Leg Press Back at 45* BLEs 75# 3X10 reps interspaced with H.S. stretching      Prosthetics   Current prosthetic wear tolerance (days/week)   daily    Current prosthetic wear tolerance (#hours/day)   5-6 hrs 2x/day    Edema  pitting edema    Residual limb condition   no open areas, normal color & temperature,                PT Short Term Goals - 06/10/19 1038      PT SHORT TERM GOAL #1   Title  Patient & son report transferring at home with sliding board & prostheses with patient participated.  (All updated STGs Target Date: 06/29/2019)    Time  4    Period  Weeks    Status  On-going    Target Date  06/29/19       PT SHORT TERM GOAL #2   Title  Patient tolerates prostheses wear daily >90% of awake hours without skin issues.    Time  4    Period  Weeks    Status  On-going    Target Date  06/29/19      PT SHORT TERM GOAL #3   Title  Stand -pivot transfer with RW between w/c & chair with armrests with modA    Time  4    Period  Weeks    Status  On-going    Target Date  06/29/19      PT SHORT TERM GOAL #4   Title  Standing balance with walker support static 2 minutes with supervision and reaches 10" anteriorly with minA    Time  4    Period  Weeks    Status  On-going    Target Date  06/29/19      PT SHORT TERM GOAL #5   Title  Patient ambulates 20' & turns 90* to position to sit with RW & prostheses with modA (2 people for safety).    Time  4    Period  Weeks    Status  On-going    Target Date  06/29/19        PT Long Term Goals - 04/19/19 2144      PT LONG TERM GOAL #1   Title  Patient and family verbalize and demonstrate understanding of proper prosthetic care to enable safe utilization of prostheses. (All LTGs Target Date: 09/24/2019)    Time  6    Period  Months    Status  On-going    Target Date  09/24/19      PT LONG TERM GOAL #2   Title  Patient tolerates wear of bilateral prostheses >90% of awake hours without skin issues or limb pain to enable functional potential during his day.    Time  6    Period  Months    Status  On-going    Target Date  09/24/19      PT LONG TERM GOAL #3   Title  Squat pivot transfer bed  to w/c or chair to w/c with prostheses modified independent.    Time  6    Period  Months    Status  On-going    Target Date  09/24/19      PT LONG TERM GOAL #4   Title  Sit to / from stand elevated w/c to walker with prostheses with supervision.    Time  6    Period  Months    Status  On-going    Target Date  09/24/19      PT LONG TERM GOAL #5   Title  Standing balance with walker support with bilateral prostheses static stance for 5 minutes,  scanning environment and reaches 2" anteriorly with supervision.    Time  6    Period  Months    Status  On-going    Target Date  09/24/19      PT LONG TERM GOAL #6   Title  Patient ambulates 15' between 2 chairs including turning 90* to position to sit with RW & prostheses with minA from family safely.    Time  6    Period  Months    Status  On-going    Target Date  09/24/19            Plan - 06/15/19 1358    Clinical Impression Statement  Showed some improvements in ambulation tolerance/distance today but continues to have difficulty with 90 deg turns and taking steps backward still requiring assistance for safe transfers. He will continue to benefit from skilled PT.    Personal Factors and Comorbidities  Age;Comorbidity 3+;Fitness;Time since onset of injury/illness/exacerbation    Comorbidities  Bil TTAs, DM2, PAD, HTN, CHF, A-Fib, TIA, CKDst3,    Examination-Activity Limitations  Locomotion Level;Sit;Stand;Toileting;Transfers    Examination-Participation Restrictions  Community Activity    Stability/Clinical Decision Making  Evolving/Moderate complexity    Rehab Potential  Good    PT Frequency  2x / week    PT Duration  Other (comment)   25 weeks (6 months)   PT Treatment/Interventions  ADLs/Self Care Home Management;DME Instruction;Gait training;Stair training;Functional mobility training;Therapeutic activities;Therapeutic exercise;Balance training;Neuromuscular re-education;Patient/family education;Prosthetic Training;Manual techniques;Passive range of motion;Vestibular    PT Next Visit Plan  work towards updated STGs, transfers for stand pivot, Nustep, leg press, standing balance & gait with RW when 2nd person available to assist, golf swings seated on mat table with patient in higher position    Consulted and Agree with Plan of Care  Patient;Family member/caregiver    Family Member Consulted  son, Aldrich Lloyd       Patient will benefit from skilled therapeutic  intervention in order to improve the following deficits and impairments:  Abnormal gait, Cardiopulmonary status limiting activity, Decreased activity tolerance, Decreased balance, Decreased endurance, Decreased knowledge of use of DME, Decreased mobility, Decreased range of motion, Decreased scar mobility, Decreased strength, Increased edema, Impaired flexibility, Impaired UE functional use, Postural dysfunction, Prosthetic Dependency, Pain  Visit Diagnosis: Other abnormalities of gait and mobility  Unsteadiness on feet  Abnormal posture  Weakness generalized  Contracture of muscle, multiple sites  Stiffness of right knee, not elsewhere classified  Stiffness of left knee, not elsewhere classified     Problem List Patient Active Problem List   Diagnosis Date Noted  . Weakness generalized 04/05/2019  . S/P BKA (below knee amputation) unilateral, left ([place]) 02/15/2019  . Pressure injury of skin 02/03/2019  . TIA (transient ischemic attack) 02/03/2019  . Transient speech disturbance 02/02/2019  . Chronic indwelling  Foley catheter 01/22/2019  . Postoperative wound infection 01/20/2019  . Normocytic anemia 01/18/2019  . Cellulitis 01/18/2019  . Infection of deep incisional surgical site after procedure   . Abnormal urinalysis   . Labile blood glucose   . Urinary retention   . S/P bilateral BKA (below knee amputation) ([place])   . Acute blood loss anemia   . Urinary retention due to benign prostatic hyperplasia 12/28/2018  . Unilateral complete BKA, left, initial encounter ([place]) 12/25/2018  . Dehiscence of amputation stump ([place])   . History of transmetatarsal amputation of left foot ([place]) 12/09/2018  . Critical limb ischemia with history of revascularization of same extremity   . Gangrene of left foot ([place])   . Diabetic ulcer of toe of left foot associated with type 2 diabetes mellitus ([place])   . Cellulitis of left lower extremity   . Toe infection 11/18/2018  . Peripheral edema    . Acute on chronic diastolic CHF (congestive heart failure) ([place]) 11/09/2018  . S/P BKA (below knee amputation) unilateral, right ([place]) 10/05/2018  . Hyponatremia   . Essential hypertension   . Postoperative pain   . Pain of left heel   . Unable to maintain weight-bearing   . Type 2 diabetes mellitus with diabetic peripheral angiopathy with gangrene ([place])   . Anemia due to acute blood loss   . Chronic kidney disease, stage 3a   . Acquired absence of right leg below knee ([place]) 09/07/2018  . Gangrene of right foot ([place])   . DNR (do not resuscitate) discussion   . Goals of care, counseling/discussion   . Palliative care by specialist   . Type 2 diabetes mellitus with vascular disease ([place])   . Severe protein-calorie malnutrition ([place])   . Ischemic foot 08/20/2018  . Critical lower limb ischemia 08/03/2018  . Type 2 diabetes mellitus with foot ulcer, without long-term current use of insulin ([place])   . Gastroesophageal reflux disease   . Cellulitis of great toe of right foot 05/29/2018  . Atrial fibrillation, chronic   . Chest pain 07/25/2017  . PAF (paroxysmal atrial fibrillation) ([place]) 07/25/2017  . BPH (benign prostatic hyperplasia) 07/25/2017  . Shortness of breath 05/11/2017  . Hypothyroidism 05/11/2017  . Chronic diastolic CHF (congestive heart failure) ([place]) 05/11/2017  . Moderate aortic stenosis 06/12/2016  . RBBB 06/12/2016  . Peripheral vascular disease ([place]) 08/04/2012  . Essential hypertension, benign 08/04/2012  . Hyperlipidemia 08/04/2012  . Dizziness 08/04/2012    Silvestre Mesi 06/15/2019, 2:04 PM  [facility] [address] [place], [place], [address] Phone: [phone]   Fax:  [phone]  Name: COLESTON DIROSA MRN: [id number] Date of Birth: 06-28-1927

## 2019-06-17 ENCOUNTER — Encounter: Payer: Self-pay | Admitting: Physical Therapy

## 2019-06-17 ENCOUNTER — Other Ambulatory Visit: Payer: Self-pay

## 2019-06-17 ENCOUNTER — Ambulatory Visit (INDEPENDENT_AMBULATORY_CARE_PROVIDER_SITE_OTHER): Payer: Medicare Other | Admitting: Physical Therapy

## 2019-06-17 DIAGNOSIS — R531 Weakness: Secondary | ICD-10-CM

## 2019-06-17 DIAGNOSIS — R293 Abnormal posture: Secondary | ICD-10-CM

## 2019-06-17 DIAGNOSIS — R2689 Other abnormalities of gait and mobility: Secondary | ICD-10-CM

## 2019-06-17 DIAGNOSIS — R600 Localized edema: Secondary | ICD-10-CM

## 2019-06-17 DIAGNOSIS — M25662 Stiffness of left knee, not elsewhere classified: Secondary | ICD-10-CM

## 2019-06-17 DIAGNOSIS — M6249 Contracture of muscle, multiple sites: Secondary | ICD-10-CM

## 2019-06-17 DIAGNOSIS — R2681 Unsteadiness on feet: Secondary | ICD-10-CM

## 2019-06-17 DIAGNOSIS — M25661 Stiffness of right knee, not elsewhere classified: Secondary | ICD-10-CM

## 2019-06-17 NOTE — Therapy (Signed)
[facility] Physical Therapy [address] [place], [place], [address] Phone: [phone]   Fax:  [phone]  Physical Therapy Treatment  Patient Details  Name: [name] MRN: [id number] Date of Birth: [birth year] Referring Provider (PT): [name], MD   Encounter Date: 06/17/2019  PT End of Session - 06/17/19 1313    Visit Number  22    Number of Visits  50    Date for PT Re-Evaluation  06/30/19    Authorization Type  [employer] Medicare    Authorization Time Period  $30 co-pay    Progress Note Due on Visit  30    PT Start Time  1300    PT Stop Time  1350    PT Time Calculation (min)  50 min    Equipment Utilized During Treatment  Gait belt    Activity Tolerance  Patient tolerated treatment well    Behavior During Therapy  [facility] for tasks assessed/performed       Past Medical History:  Diagnosis Date  . Arthritis   . Borderline diabetic   . Diabetes mellitus without complication ([place])   . Glaucoma   . Hyperlipidemia   . Hypertension   . PVD (peripheral vascular disease) ([place])   . Sleep apnea    Cpap ordered but doesnt use  . Urinary retention due to benign prostatic hyperplasia 12/28/2018    Past Surgical History:  Procedure Laterality Date  . ABDOMINAL AORTOGRAM W/LOWER EXTREMITY N/A 07/27/2018   Procedure: ABDOMINAL AORTOGRAM W/LOWER EXTREMITY;  Surgeon: Lorretta Harp, MD;  Location: [place] [facility];  Service: Cardiovascular;  Laterality: N/A;  . AMPUTATION Right 08/28/2018   Procedure: RIGHT BELOW KNEE AMPUTATION;  Surgeon: [name], MD;  Location: [place];  Service: Orthopedics;  Laterality: Right;  . AMPUTATION Left 12/02/2018   Procedure: LEFT TRANSMETATARSAL AMPUTATION AND NEGATIVE PRESSURE WOUND VAC PLACEMENT;  Surgeon: [name], MD;  Location: [place];  Service: Orthopedics;  Laterality: Left;  . AMPUTATION Left 12/18/2018   Procedure: LEFT BELOW KNEE AMPUTATION;  Surgeon: [name], MD;  Location: [place];  Service:  Orthopedics;  Laterality: Left;  . APPENDECTOMY  1943  . BELOW KNEE LEG AMPUTATION Left 12/18/2018  . CATARACT EXTRACTION  2012   x2  . COLONOSCOPY N/A 11/01/2014   Procedure: COLONOSCOPY;  Surgeon: Aviva Signs, MD;  [facility];  Service: Gastroenterology;  Laterality: N/A;  . ESOPHAGOGASTRODUODENOSCOPY N/A 11/01/2014   Procedure: ESOPHAGOGASTRODUODENOSCOPY (EGD);  Surgeon: Aviva Signs, MD;  [facility];  Service: Gastroenterology;  Laterality: N/A;  . HERNIA REPAIR Left   . INGUINAL HERNIA REPAIR Right 03/01/2013   Procedure: RIGHT INGUINAL HERNIORRHAPHY;  Surgeon: Jamesetta So, MD;  Location: [facility];  Service: General;  Laterality: Right;  . INSERTION OF MESH Right 03/01/2013   Procedure: INSERTION OF MESH;  Surgeon: Jamesetta So, MD;  [facility];  Service: General;  Laterality: Right;  . LOWER EXTREMITY ANGIOGRAPHY Right 08/25/2018   Procedure: LOWER EXTREMITY ANGIOGRAPHY;  Surgeon: Wellington Hampshire, MD;  Location: [place] [facility];  Service: Cardiovascular;  Laterality: Right;  . LOWER EXTREMITY ANGIOGRAPHY N/A 11/25/2018   Procedure: LOWER EXTREMITY ANGIOGRAPHY;  Surgeon: Wellington Hampshire, MD;  Location: [place] [facility];  Service: Cardiovascular;  Laterality: N/A;  . NM MYOCAR PERF WALL MOTION  06/05/2010   no significant ischemia  . PERIPHERAL VASCULAR ATHERECTOMY Right 08/03/2018   Procedure: PERIPHERAL VASCULAR ATHERECTOMY;  Surgeon: Lorretta Harp, MD;  Location: [place] [facility];  Service: Cardiovascular;  Laterality: Right;  . PERIPHERAL VASCULAR ATHERECTOMY Left 11/23/2018   Procedure: PERIPHERAL VASCULAR ATHERECTOMY;  Surgeon: Lorretta Harp, MD;  Location: [place] [facility];  Service: Cardiovascular;  Laterality: Left;  sfa with dc balloon  . PERIPHERAL VASCULAR BALLOON ANGIOPLASTY Left 11/25/2018   Procedure: PERIPHERAL VASCULAR BALLOON ANGIOPLASTY;  Surgeon: Wellington Hampshire, MD;  Location: [place] [facility];  Service:  Cardiovascular;  Laterality: Left;  popliteal  . PERIPHERAL VASCULAR INTERVENTION Left 11/25/2018   Procedure: PERIPHERAL VASCULAR INTERVENTION;  Surgeon: Wellington Hampshire, MD;  Location: [place] [facility];  Service: Cardiovascular;  Laterality: Left;  tibial peroneal trunk and peroneal stents   . stent  06/14/2010   left leg  . STUMP REVISION Left 01/20/2019   Procedure: REVISION LEFT BELOW KNEE AMPUTATION;  Surgeon: [name], MD;  Location: [place];  Service: Orthopedics;  Laterality: Left;  . US ECHOCARDIOGRAPHY  01/21/2006   moderate mitral annular ca+, mild MR, AOV moderately sclerotic.    There were no vitals filed for this visit.  Subjective Assessment - 06/17/19 1315    Subjective  No issues. His son & he feel that he is getting better but needs more PT.    Patient is accompained by:  Family member   son, Stefen Juba   Pertinent History  Bil TTAs, DM2, PAD, HTN, CHF, A-Fib, TIA, CKDst3    Patient Stated Goals  walk with prostheses in home & in/out house. Son would like him to be able to toilet & get in/out bed by himself.    Currently in Pain?  No/denies                        [facility] Adult PT Treatment/Exercise - 06/17/19 1300      Transfers   Transfers  Sit to Stand;Stand to Sit;Squat Pivot Transfers;Lateral/Scoot Transfers;Stand Pivot Transfers    Sit to Stand  3: Mod assist;With upper extremity assist;With armrests;From chair/3-in-1;Other (comment)   to RW being stabilized by PT   Sit to Stand Details  Manual facilitation for weight shifting;Verbal cues for safe use of DME/AE;Verbal cues for technique;Verbal cues for sequencing    Sit to Stand: Patient Percentage  60%    Stand to Sit  4: Min assist;With upper extremity assist;With armrests;To chair/3-in-1;Other (comment)   from RW   Stand to Sit Details (indicate cue type and reason)  Verbal cues for technique;Verbal cues for sequencing;Manual facilitation for weight shifting;Verbal cues for safe use of  DME/AE    Squat Pivot Transfers  3: Mod assist;With upper extremity assistance   w/c <> Nustep with bil. prostheses     Ambulation/Gait   Ambulation/Gait  Yes    Ambulation/Gait Assistance  2: Max assist;3: Mod assist   2 person assist for safety, maxA initial 10' then modA   Ambulation/Gait Assistance Details  manual/tactile & verbal cues on upright posture & wt shift    Ambulation Distance (Feet)  23 Feet   15' + 180* turn to sit, 23' straight path   Assistive device  Prostheses;Rolling walker    Gait Pattern  Step-to pattern;Decreased step length - right;Decreased step length - left;Decreased hip/knee flexion - right;Decreased hip/knee flexion - left;Right foot flat;Left foot flat;Right flexed knee in stance;Left flexed knee in stance;Shuffle;Lateral hip instability;Trunk flexed;Poor foot clearance - left;Poor foot clearance - right    Ambulation Surface  Level;Indoor    Gait Comments  demo &  verbal cues in technique for step up & step down with prostheses as pre-gait for curb negotiation. Total assist to step Rt foot onto 4" block with manual assist to keep LLE from buckling during time that RLE on block & LLE on floor, then manual total assist to step 2nd foot (LLE ) up onto 4" block.  Attempted to step down forward but unable so PTs sat pt back into his w/c.      Knee/Hip Exercises: Stretches   Passive Hamstring Stretch  Right;Left;2 reps;20 seconds    Passive Hamstring Stretch Limitations  stretch strap assist on forefoot & PT manual knee ext with lower positioned up on leg press plate.     Hip Flexor Stretch  Right;Left;2 reps;20 seconds    Hip Flexor Stretch Limitations  modified Thomas position with foot on leg press plate to decrease lumbar lordosis. PT manual stretch into hip extension.       Knee/Hip Exercises: Aerobic   Nustep  Level 5 BUEs & BLEs 7 min with verbal & manual cues on full ROM   seat 12     Knee/Hip Exercises: Machines for Strengthening   Cybex Leg Press   Shuttle Leg Press Back at 45* BLEs 75# 15 reps       Prosthetics   Current prosthetic wear tolerance (days/week)   daily    Current prosthetic wear tolerance (#hours/day)   ~6 hrs 2x/day    Edema  pitting edema    Residual limb condition   no open areas, normal color & temperature,                PT Short Term Goals - 06/10/19 1038      PT SHORT TERM GOAL #1   Title  Patient & son report transferring at home with sliding board & prostheses with patient participated.  (All updated STGs Target Date: 06/29/2019)    Time  4    Period  Weeks    Status  On-going    Target Date  06/29/19      PT SHORT TERM GOAL #2   Title  Patient tolerates prostheses wear daily >90% of awake hours without skin issues.    Time  4    Period  Weeks    Status  On-going    Target Date  06/29/19      PT SHORT TERM GOAL #3   Title  Stand -pivot transfer with RW between w/c & chair with armrests with modA    Time  4    Period  Weeks    Status  On-going    Target Date  06/29/19      PT SHORT TERM GOAL #4   Title  Standing balance with walker support static 2 minutes with supervision and reaches 10" anteriorly with minA    Time  4    Period  Weeks    Status  On-going    Target Date  06/29/19      PT SHORT TERM GOAL #5   Title  Patient ambulates 20' & turns 90* to position to sit with RW & prostheses with modA (2 people for safety).    Time  4    Period  Weeks    Status  On-going    Target Date  06/29/19        PT Long Term Goals - 04/19/19 2144      PT LONG TERM GOAL #1   Title  Patient and family verbalize and demonstrate understanding  of proper prosthetic care to enable safe utilization of prostheses. (All LTGs Target Date: 09/24/2019)    Time  6    Period  Months    Status  On-going    Target Date  09/24/19      PT LONG TERM GOAL #2   Title  Patient tolerates wear of bilateral prostheses >90% of awake hours without skin issues or limb pain to enable functional potential during his  day.    Time  6    Period  Months    Status  On-going    Target Date  09/24/19      PT LONG TERM GOAL #3   Title  Squat pivot transfer bed to w/c or chair to w/c with prostheses modified independent.    Time  6    Period  Months    Status  On-going    Target Date  09/24/19      PT LONG TERM GOAL #4   Title  Sit to / from stand elevated w/c to walker with prostheses with supervision.    Time  6    Period  Months    Status  On-going    Target Date  09/24/19      PT LONG TERM GOAL #5   Title  Standing balance with walker support with bilateral prostheses static stance for 5 minutes, scanning environment and reaches 2" anteriorly with supervision.    Time  6    Period  Months    Status  On-going    Target Date  09/24/19      PT LONG TERM GOAL #6   Title  Patient ambulates 67' between 2 chairs including turning 90* to position to sit with RW & prostheses with minA from family safely.    Time  6    Period  Months    Status  On-going    Target Date  09/24/19            Plan - 06/17/19 1405    Clinical Impression Statement  PT session progressing with level surface prosthetic gait with RW in that maxA for initial 10' then modA once in motion.  PT session worked on progressing strength & flexibility exercises.  Currently working on initial 48-NIO certification period with initial plan of care was for 6 months and it appears he will need the other 3 months to progress his mobility further.    Personal Factors and Comorbidities  Age;Comorbidity 3+;Fitness;Time since onset of injury/illness/exacerbation    Comorbidities  Bil TTAs, DM2, PAD, HTN, CHF, A-Fib, TIA, CKDst3,    Examination-Activity Limitations  Locomotion Level;Sit;Stand;Toileting;Transfers    Examination-Participation Restrictions  Community Activity    Stability/Clinical Decision Making  Evolving/Moderate complexity    Rehab Potential  Good    PT Frequency  2x / week    PT Duration  Other (comment)   25 weeks (6  months)   PT Treatment/Interventions  ADLs/Self Care Home Management;DME Instruction;Gait training;Stair training;Functional mobility training;Therapeutic activities;Therapeutic exercise;Balance training;Neuromuscular re-education;Patient/family education;Prosthetic Training;Manual techniques;Passive range of motion;Vestibular    PT Next Visit Plan  work towards updated STGs, transfers for stand pivot, Nustep, leg press, standing balance & gait with RW when 2nd person available to assist, golf swings seated on mat table with patient in higher position    Consulted and Agree with Plan of Care  Patient;Family member/caregiver    Family Member Consulted  son, Kamani Lewter       Patient will benefit from skilled therapeutic intervention  in order to improve the following deficits and impairments:  Abnormal gait, Cardiopulmonary status limiting activity, Decreased activity tolerance, Decreased balance, Decreased endurance, Decreased knowledge of use of DME, Decreased mobility, Decreased range of motion, Decreased scar mobility, Decreased strength, Increased edema, Impaired flexibility, Impaired UE functional use, Postural dysfunction, Prosthetic Dependency, Pain  Visit Diagnosis: Other abnormalities of gait and mobility  Unsteadiness on feet  Abnormal posture  Weakness generalized  Contracture of muscle, multiple sites  Stiffness of right knee, not elsewhere classified  Stiffness of left knee, not elsewhere classified  Localized edema     Problem List Patient Active Problem List   Diagnosis Date Noted  . Weakness generalized 04/05/2019  . S/P BKA (below knee amputation) unilateral, left ([place]) 02/15/2019  . Pressure injury of skin 02/03/2019  . TIA (transient ischemic attack) 02/03/2019  . Transient speech disturbance 02/02/2019  . Chronic indwelling Foley catheter 01/22/2019  . Postoperative wound infection 01/20/2019  . Normocytic anemia 01/18/2019  . Cellulitis 01/18/2019  .  Infection of deep incisional surgical site after procedure   . Abnormal urinalysis   . Labile blood glucose   . Urinary retention   . S/P bilateral BKA (below knee amputation) ([place])   . Acute blood loss anemia   . Urinary retention due to benign prostatic hyperplasia 12/28/2018  . Unilateral complete BKA, left, initial encounter ([place]) 12/25/2018  . Dehiscence of amputation stump ([place])   . History of transmetatarsal amputation of left foot ([place]) 12/09/2018  . Critical limb ischemia with history of revascularization of same extremity   . Gangrene of left foot ([place])   . Diabetic ulcer of toe of left foot associated with type 2 diabetes mellitus ([place])   . Cellulitis of left lower extremity   . Toe infection 11/18/2018  . Peripheral edema   . Acute on chronic diastolic CHF (congestive heart failure) ([place]) 11/09/2018  . S/P BKA (below knee amputation) unilateral, right ([place]) 10/05/2018  . Hyponatremia   . Essential hypertension   . Postoperative pain   . Pain of left heel   . Unable to maintain weight-bearing   . Type 2 diabetes mellitus with diabetic peripheral angiopathy with gangrene ([place])   . Anemia due to acute blood loss   . Chronic kidney disease, stage 3a   . Acquired absence of right leg below knee ([place]) 09/07/2018  . Gangrene of right foot ([place])   . DNR (do not resuscitate) discussion   . Goals of care, counseling/discussion   . Palliative care by specialist   . Type 2 diabetes mellitus with vascular disease ([place])   . Severe protein-calorie malnutrition ([place])   . Ischemic foot 08/20/2018  . Critical lower limb ischemia 08/03/2018  . Type 2 diabetes mellitus with foot ulcer, without long-term current use of insulin ([place])   . Gastroesophageal reflux disease   . Cellulitis of great toe of right foot 05/29/2018  . Atrial fibrillation, chronic   . Chest pain 07/25/2017  . PAF (paroxysmal atrial fibrillation) ([place]) 07/25/2017  . BPH (benign prostatic hyperplasia) 07/25/2017  .  Shortness of breath 05/11/2017  . Hypothyroidism 05/11/2017  . Chronic diastolic CHF (congestive heart failure) ([place]) 05/11/2017  . Moderate aortic stenosis 06/12/2016  . RBBB 06/12/2016  . Peripheral vascular disease ([place]) 08/04/2012  . Essential hypertension, benign 08/04/2012  . Hyperlipidemia 08/04/2012  . Dizziness 08/04/2012    Jamey Reas PT, DPT 06/17/2019, 2:12 PM  [facility] [address] [place], [place], [address] Phone: [phone]  Fax:  [phone]  Name: CLIFORD SEQUEIRA MRN: [id number] Date of Birth: 19-Jan-1927

## 2019-06-21 ENCOUNTER — Other Ambulatory Visit: Payer: Self-pay

## 2019-06-21 ENCOUNTER — Encounter: Payer: Self-pay | Admitting: Physical Therapy

## 2019-06-21 ENCOUNTER — Ambulatory Visit (INDEPENDENT_AMBULATORY_CARE_PROVIDER_SITE_OTHER): Payer: Medicare Other | Admitting: Physical Therapy

## 2019-06-21 DIAGNOSIS — R531 Weakness: Secondary | ICD-10-CM | POA: Diagnosis not present

## 2019-06-21 DIAGNOSIS — M25662 Stiffness of left knee, not elsewhere classified: Secondary | ICD-10-CM

## 2019-06-21 DIAGNOSIS — M6249 Contracture of muscle, multiple sites: Secondary | ICD-10-CM

## 2019-06-21 DIAGNOSIS — R2681 Unsteadiness on feet: Secondary | ICD-10-CM | POA: Diagnosis not present

## 2019-06-21 DIAGNOSIS — R600 Localized edema: Secondary | ICD-10-CM

## 2019-06-21 DIAGNOSIS — R293 Abnormal posture: Secondary | ICD-10-CM

## 2019-06-21 DIAGNOSIS — R2689 Other abnormalities of gait and mobility: Secondary | ICD-10-CM

## 2019-06-21 DIAGNOSIS — M25661 Stiffness of right knee, not elsewhere classified: Secondary | ICD-10-CM

## 2019-06-21 NOTE — Therapy (Signed)
[facility] [address] [place], [place], [address] Phone: [phone]   Fax:  [phone]  Physical Therapy Treatment  Patient Details  Name: [name] MRN: [id number] Date of Birth: 1927/01/22 Referring Provider (PT): [name], MD   Encounter Date: 06/21/2019  PT End of Session - 06/21/19 1143    Visit Number  23    Number of Visits  50    Date for PT Re-Evaluation  06/30/19    Authorization Type  [employer] Medicare    Authorization Time Period  $30 co-pay    Progress Note Due on Visit  30    PT Start Time  1143    PT Stop Time  1237    PT Time Calculation (min)  54 min    Equipment Utilized During Treatment  Gait belt    Activity Tolerance  Patient tolerated treatment well    Behavior During Therapy  [facility] for tasks assessed/performed       Past Medical History:  Diagnosis Date  . Arthritis   . Borderline diabetic   . Diabetes mellitus without complication ([place])   . Glaucoma   . Hyperlipidemia   . Hypertension   . PVD (peripheral vascular disease) ([place])   . Sleep apnea    Cpap ordered but doesnt use  . Urinary retention due to benign prostatic hyperplasia 12/28/2018    Past Surgical History:  Procedure Laterality Date  . ABDOMINAL AORTOGRAM W/LOWER EXTREMITY N/A 07/27/2018   Procedure: ABDOMINAL AORTOGRAM W/LOWER EXTREMITY;  Surgeon: Lorretta Harp, MD;  Location: [place] [facility];  Service: Cardiovascular;  Laterality: N/A;  . AMPUTATION Right 08/28/2018   Procedure: RIGHT BELOW KNEE AMPUTATION;  Surgeon: [name], MD;  Location: [place];  Service: Orthopedics;  Laterality: Right;  . AMPUTATION Left 12/02/2018   Procedure: LEFT TRANSMETATARSAL AMPUTATION AND NEGATIVE PRESSURE WOUND VAC PLACEMENT;  Surgeon: [name], MD;  Location: [place];  Service: Orthopedics;  Laterality: Left;  . AMPUTATION Left 12/18/2018   Procedure: LEFT BELOW KNEE AMPUTATION;  Surgeon: [name], MD;  Location: [place];  Service:  Orthopedics;  Laterality: Left;  . APPENDECTOMY  1943  . BELOW KNEE LEG AMPUTATION Left 12/18/2018  . CATARACT EXTRACTION  2012   x2  . COLONOSCOPY N/A 11/01/2014   Procedure: COLONOSCOPY;  Surgeon: Aviva Signs, MD;  [facility];  Service: Gastroenterology;  Laterality: N/A;  . ESOPHAGOGASTRODUODENOSCOPY N/A 11/01/2014   Procedure: ESOPHAGOGASTRODUODENOSCOPY (EGD);  Surgeon: Aviva Signs, MD;  [facility];  Service: Gastroenterology;  Laterality: N/A;  . HERNIA REPAIR Left   . INGUINAL HERNIA REPAIR Right 03/01/2013   Procedure: RIGHT INGUINAL HERNIORRHAPHY;  Surgeon: Jamesetta So, MD;  Location: [facility];  Service: General;  Laterality: Right;  . INSERTION OF MESH Right 03/01/2013   Procedure: INSERTION OF MESH;  Surgeon: Jamesetta So, MD;  Location: [facility];  Service: General;  Laterality: Right;  . LOWER EXTREMITY ANGIOGRAPHY Right 08/25/2018   Procedure: LOWER EXTREMITY ANGIOGRAPHY;  Surgeon: Wellington Hampshire, MD;  Location: [place] [facility];  Service: Cardiovascular;  Laterality: Right;  . LOWER EXTREMITY ANGIOGRAPHY N/A 11/25/2018   Procedure: LOWER EXTREMITY ANGIOGRAPHY;  Surgeon: Wellington Hampshire, MD;  Location: [place] [facility];  Service: Cardiovascular;  Laterality: N/A;  . NM MYOCAR PERF WALL MOTION  06/05/2010   no significant ischemia  . PERIPHERAL VASCULAR ATHERECTOMY Right 08/03/2018   Procedure: PERIPHERAL VASCULAR ATHERECTOMY;  Surgeon: Lorretta Harp, MD;  Location: [place] [facility];  Service: Cardiovascular;  Laterality: Right;  . PERIPHERAL VASCULAR ATHERECTOMY Left 11/23/2018   Procedure: PERIPHERAL VASCULAR ATHERECTOMY;  Surgeon: Lorretta Harp, MD;  Location: [place] [facility];  Service: Cardiovascular;  Laterality: Left;  sfa with dc balloon  . PERIPHERAL VASCULAR BALLOON ANGIOPLASTY Left 11/25/2018   Procedure: PERIPHERAL VASCULAR BALLOON ANGIOPLASTY;  Surgeon: Wellington Hampshire, MD;  Location: [place] [facility];  Service:  Cardiovascular;  Laterality: Left;  popliteal  . PERIPHERAL VASCULAR INTERVENTION Left 11/25/2018   Procedure: PERIPHERAL VASCULAR INTERVENTION;  Surgeon: Wellington Hampshire, MD;  Location: [place] [facility];  Service: Cardiovascular;  Laterality: Left;  tibial peroneal trunk and peroneal stents   . stent  06/14/2010   left leg  . STUMP REVISION Left 01/20/2019   Procedure: REVISION LEFT BELOW KNEE AMPUTATION;  Surgeon: [name], MD;  Location: [place];  Service: Orthopedics;  Laterality: Left;  . US ECHOCARDIOGRAPHY  01/21/2006   moderate mitral annular ca+, mild MR, AOV moderately sclerotic.    There were no vitals filed for this visit.  Subjective Assessment - 06/21/19 1144    Subjective  He has been doing medicine ball with daughter at home.    Patient is accompained by:  Family member   son, Montie Swiderski   Pertinent History  Bil TTAs, DM2, PAD, HTN, CHF, A-Fib, TIA, CKDst3    Patient Stated Goals  walk with prostheses in home & in/out house. Son would like him to be able to toilet & get in/out bed by himself.    Currently in Pain?  No/denies                        [facility] Adult PT Treatment/Exercise - 06/21/19 1145      Transfers   Transfers  Sit to Stand;Stand to Sit;Squat Pivot Transfers;Lateral/Scoot Transfers;Stand Pivot Transfers    Sit to Stand  3: Mod assist;With upper extremity assist;With armrests;From chair/3-in-1;Other (comment)   to RW being stabilized by PT   Sit to Stand Details  Manual facilitation for weight shifting;Verbal cues for safe use of DME/AE;Verbal cues for technique;Verbal cues for sequencing    Sit to Stand: Patient Percentage  70%    Stand to Sit  4: Min assist;With upper extremity assist;With armrests;To chair/3-in-1;Other (comment)   from RW   Stand to Sit Details (indicate cue type and reason)  Verbal cues for technique;Verbal cues for sequencing;Manual facilitation for weight shifting;Verbal cues for safe use of DME/AE    Squat  Pivot Transfers  3: Mod assist;With upper extremity assistance   w/c <> Nustep with bil. prostheses     Ambulation/Gait   Ambulation/Gait  Yes    Ambulation/Gait Assistance  2: Max assist;3: Mod assist   2nd person safety, maxA initial 61' then modA, maxA turn   Ambulation/Gait Assistance Details  manual/tactile & verbal cues on upright posture & wt shift    Ambulation Distance (Feet)  25 Feet   25' straight path, 10' + 90* turn   Assistive device  Prostheses;Rolling walker    Gait Pattern  Step-to pattern;Decreased step length - right;Decreased step length - left;Decreased hip/knee flexion - right;Decreased hip/knee flexion - left;Right foot flat;Left foot flat;Right flexed knee in stance;Left flexed knee in stance;Shuffle;Lateral hip instability;Trunk flexed;Poor foot clearance - left;Poor foot clearance - right    Ambulation Surface  Level;Indoor    Gait Comments  --      Therapeutic Activites  Other Therapeutic Activities  seated in w/c at edge so no back support with Bil prosthetic heels supported on ground: 5 minutes tossing 2# weighted ball 5' with supervision with verbal cues for upright posture.       Knee/Hip Exercises: Stretches   Passive Hamstring Stretch  Right;Left;2 reps;20 seconds    Passive Hamstring Stretch Limitations  stretch strap assist on forefoot & PT manual knee ext with lower positioned up on leg press plate.     Hip Flexor Stretch  Right;Left;2 reps;20 seconds    Hip Flexor Stretch Limitations  modified Thomas position with foot on leg press plate to decrease lumbar lordosis. PT manual stretch into hip extension.       Knee/Hip Exercises: Aerobic   Nustep  Level 5 BUEs & BLEs 7 min with verbal & manual cues on full ROM   seat 12     Knee/Hip Exercises: Machines for Strengthening   Cybex Leg Press  Shuttle Leg Press Back at 45* BLEs 87# 15 reps LLE 50# 10 reps RLE 50# 5 reps 43# 5 reps      Prosthetics   Prosthetic Care Comments   trial wear 1 hr at  beginning of each day with no shrinker under liner then continue wear with shrinker under liner.      Current prosthetic wear tolerance (days/week)   daily    Current prosthetic wear tolerance (#hours/day)   most of awake hours    Edema  pitting edema    Residual limb condition   no open areas, normal color & temperature,     Education Provided  Skin check;Residual limb care;Proper wear schedule/adjustment    Person(s) Educated  Patient;Child(ren)    Education Method  Explanation;Verbal cues    Education Method  Verbalized understanding;Verbal cues required             PT Education - 06/21/19 1230    Education Details  tossing ball with dtr seated 5' in front with pt seated at edge of w/c without back support    Person(s) Educated  Patient;Child(ren)    Methods  Explanation;Demonstration;Verbal cues    Comprehension  Verbalized understanding       PT Short Term Goals - 06/10/19 1038      PT SHORT TERM GOAL #1   Title  Patient & son report transferring at home with sliding board & prostheses with patient participated.  (All updated STGs Target Date: 06/29/2019)    Time  4    Period  Weeks    Status  On-going    Target Date  06/29/19      PT SHORT TERM GOAL #2   Title  Patient tolerates prostheses wear daily >90% of awake hours without skin issues.    Time  4    Period  Weeks    Status  On-going    Target Date  06/29/19      PT SHORT TERM GOAL #3   Title  Stand -pivot transfer with RW between w/c & chair with armrests with modA    Time  4    Period  Weeks    Status  On-going    Target Date  06/29/19      PT SHORT TERM GOAL #4   Title  Standing balance with walker support static 2 minutes with supervision and reaches 10" anteriorly with minA    Time  4    Period  Weeks    Status  On-going    Target Date  06/29/19      PT SHORT TERM GOAL #5   Title  Patient ambulates 20' & turns 90* to position to sit with RW & prostheses with modA (2 people for safety).    Time   4    Period  Weeks    Status  On-going    Target Date  06/29/19        PT Long Term Goals - 04/19/19 2144      PT LONG TERM GOAL #1   Title  Patient and family verbalize and demonstrate understanding of proper prosthetic care to enable safe utilization of prostheses. (All LTGs Target Date: 09/24/2019)    Time  6    Period  Months    Status  On-going    Target Date  09/24/19      PT LONG TERM GOAL #2   Title  Patient tolerates wear of bilateral prostheses >90% of awake hours without skin issues or limb pain to enable functional potential during his day.    Time  6    Period  Months    Status  On-going    Target Date  09/24/19      PT LONG TERM GOAL #3   Title  Squat pivot transfer bed to w/c or chair to w/c with prostheses modified independent.    Time  6    Period  Months    Status  On-going    Target Date  09/24/19      PT LONG TERM GOAL #4   Title  Sit to / from stand elevated w/c to walker with prostheses with supervision.    Time  6    Period  Months    Status  On-going    Target Date  09/24/19      PT LONG TERM GOAL #5   Title  Standing balance with walker support with bilateral prostheses static stance for 5 minutes, scanning environment and reaches 2" anteriorly with supervision.    Time  6    Period  Months    Status  On-going    Target Date  09/24/19      PT LONG TERM GOAL #6   Title  Patient ambulates 46' between 2 chairs including turning 90* to position to sit with RW & prostheses with minA from family safely.    Time  6    Period  Months    Status  On-going    Target Date  09/24/19            Plan - 06/21/19 1144    Clinical Impression Statement  Patient's daughter attended PT session today and appears to understand new seated activity without back support.  PT sesssion worked on strength, endurance & flexibility with Nustep, leg press & stretches.    Personal Factors and Comorbidities  Age;Comorbidity 3+;Fitness;Time since onset of  injury/illness/exacerbation    Comorbidities  Bil TTAs, DM2, PAD, HTN, CHF, A-Fib, TIA, CKDst3,    Examination-Activity Limitations  Locomotion Level;Sit;Stand;Toileting;Transfers    Examination-Participation Restrictions  Community Activity    Stability/Clinical Decision Making  Evolving/Moderate complexity    Rehab Potential  Good    PT Frequency  2x / week    PT Duration  Other (comment)   25 weeks (6 months)   PT Treatment/Interventions  ADLs/Self Care Home Management;DME Instruction;Gait training;Stair training;Functional mobility training;Therapeutic activities;Therapeutic exercise;Balance training;Neuromuscular re-education;Patient/family education;Prosthetic Training;Manual techniques;Passive range of motion;Vestibular    PT Next Visit Plan  check updated STGs over next 2 visits  and do recertification, transfers for stand pivot, Nustep, leg press, standing balance & gait with RW when 2nd person available to assist, golf swings seated on mat table with patient in higher position    Consulted and Agree with Plan of Care  Patient;Family member/caregiver    Family Member Consulted  dtr, [name]       Patient will benefit from skilled therapeutic intervention in order to improve the following deficits and impairments:  Abnormal gait, Cardiopulmonary status limiting activity, Decreased activity tolerance, Decreased balance, Decreased endurance, Decreased knowledge of use of DME, Decreased mobility, Decreased range of motion, Decreased scar mobility, Decreased strength, Increased edema, Impaired flexibility, Impaired UE functional use, Postural dysfunction, Prosthetic Dependency, Pain  Visit Diagnosis: Other abnormalities of gait and mobility  Unsteadiness on feet  Abnormal posture  Weakness generalized  Contracture of muscle, multiple sites  Stiffness of right knee, not elsewhere classified  Stiffness of left knee, not elsewhere classified  Localized edema     Problem  List Patient Active Problem List   Diagnosis Date Noted  . Weakness generalized 04/05/2019  . S/P BKA (below knee amputation) unilateral, left ([place]) 02/15/2019  . Pressure injury of skin 02/03/2019  . TIA (transient ischemic attack) 02/03/2019  . Transient speech disturbance 02/02/2019  . Chronic indwelling Foley catheter 01/22/2019  . Postoperative wound infection 01/20/2019  . Normocytic anemia 01/18/2019  . Cellulitis 01/18/2019  . Infection of deep incisional surgical site after procedure   . Abnormal urinalysis   . Labile blood glucose   . Urinary retention   . S/P bilateral BKA (below knee amputation) ([place])   . Acute blood loss anemia   . Urinary retention due to benign prostatic hyperplasia 12/28/2018  . Unilateral complete BKA, left, initial encounter ([place]) 12/25/2018  . Dehiscence of amputation stump ([place])   . History of transmetatarsal amputation of left foot ([place]) 12/09/2018  . Critical limb ischemia with history of revascularization of same extremity   . Gangrene of left foot ([place])   . Diabetic ulcer of toe of left foot associated with type 2 diabetes mellitus ([place])   . Cellulitis of left lower extremity   . Toe infection 11/18/2018  . Peripheral edema   . Acute on chronic diastolic CHF (congestive heart failure) ([place]) 11/09/2018  . S/P BKA (below knee amputation) unilateral, right ([place]) 10/05/2018  . Hyponatremia   . Essential hypertension   . Postoperative pain   . Pain of left heel   . Unable to maintain weight-bearing   . Type 2 diabetes mellitus with diabetic peripheral angiopathy with gangrene ([place])   . Anemia due to acute blood loss   . Chronic kidney disease, stage 3a   . Acquired absence of right leg below knee ([place]) 09/07/2018  . Gangrene of right foot ([place])   . DNR (do not resuscitate) discussion   . Goals of care, counseling/discussion   . Palliative care by specialist   . Type 2 diabetes mellitus with vascular disease ([place])   . Severe  protein-calorie malnutrition ([place])   . Ischemic foot 08/20/2018  . Critical lower limb ischemia 08/03/2018  . Type 2 diabetes mellitus with foot ulcer, without long-term current use of insulin ([place])   . Gastroesophageal reflux disease   . Cellulitis of great toe of right foot 05/29/2018  . Atrial fibrillation, chronic   . Chest pain 07/25/2017  . PAF (paroxysmal atrial fibrillation) ([place]) 07/25/2017  . BPH (benign prostatic hyperplasia) 07/25/2017  . Shortness of breath 05/11/2017  .  Hypothyroidism 05/11/2017  . Chronic diastolic CHF (congestive heart failure) ([place]) 05/11/2017  . Moderate aortic stenosis 06/12/2016  . RBBB 06/12/2016  . Peripheral vascular disease ([place]) 08/04/2012  . Essential hypertension, benign 08/04/2012  . Hyperlipidemia 08/04/2012  . Dizziness 08/04/2012    Jamey Reas PT, DPT 06/21/2019, 9:37 PM  [facility] [address] [place], [place], [address] Phone: [phone]   Fax:  [phone]  Name: [name] MRN: [id number] Date of Birth: 1927/07/23

## 2019-06-22 DIAGNOSIS — M79672 Pain in left foot: Secondary | ICD-10-CM | POA: Diagnosis not present

## 2019-06-24 ENCOUNTER — Encounter: Payer: Self-pay | Admitting: Physical Therapy

## 2019-06-24 ENCOUNTER — Other Ambulatory Visit: Payer: Self-pay

## 2019-06-24 ENCOUNTER — Ambulatory Visit (INDEPENDENT_AMBULATORY_CARE_PROVIDER_SITE_OTHER): Payer: Medicare Other | Admitting: Physical Therapy

## 2019-06-24 DIAGNOSIS — R2681 Unsteadiness on feet: Secondary | ICD-10-CM

## 2019-06-24 DIAGNOSIS — R2689 Other abnormalities of gait and mobility: Secondary | ICD-10-CM

## 2019-06-24 DIAGNOSIS — M6249 Contracture of muscle, multiple sites: Secondary | ICD-10-CM | POA: Diagnosis not present

## 2019-06-24 DIAGNOSIS — R293 Abnormal posture: Secondary | ICD-10-CM | POA: Diagnosis not present

## 2019-06-24 DIAGNOSIS — R531 Weakness: Secondary | ICD-10-CM

## 2019-06-24 DIAGNOSIS — M25662 Stiffness of left knee, not elsewhere classified: Secondary | ICD-10-CM

## 2019-06-24 DIAGNOSIS — M25661 Stiffness of right knee, not elsewhere classified: Secondary | ICD-10-CM

## 2019-06-24 DIAGNOSIS — R3914 Feeling of incomplete bladder emptying: Secondary | ICD-10-CM | POA: Diagnosis not present

## 2019-06-24 NOTE — Therapy (Signed)
[facility] [address] [place], [place], [address] Phone: [phone]   Fax:  [phone]  Physical Therapy Treatment  Patient Details  Name: [name] MRN: [id number] Date of Birth: [birth year] Referring Provider (PT): [name], MD   Encounter Date: 06/24/2019   PT End of Session - 06/24/19 1723    Visit Number 24    Number of Visits 50    Date for PT Re-Evaluation 06/30/19    Authorization Type U[employer] Medicare    Authorization Time Period $30 co-pay    Progress Note Due on Visit 30    PT Start Time 1100    PT Stop Time 1144    PT Time Calculation (min) 44 min    Equipment Utilized During Treatment Gait belt    Activity Tolerance Patient tolerated treatment well    Behavior During Therapy WFL for tasks assessed/performed           Past Medical History:  Diagnosis Date  . Arthritis   . Borderline diabetic   . Diabetes mellitus without complication ([place])   . Glaucoma   . Hyperlipidemia   . Hypertension   . PVD (peripheral vascular disease) ([place])   . Sleep apnea    Cpap ordered but doesnt use  . Urinary retention due to benign prostatic hyperplasia 12/28/2018    Past Surgical History:  Procedure Laterality Date  . ABDOMINAL AORTOGRAM W/LOWER EXTREMITY N/A 07/27/2018   Procedure: ABDOMINAL AORTOGRAM W/LOWER EXTREMITY;  Surgeon: Lorretta Harp, MD;  Location: [place] [facility];  Service: Cardiovascular;  Laterality: N/A;  . AMPUTATION Right 08/28/2018   Procedure: RIGHT BELOW KNEE AMPUTATION;  Surgeon: [name], MD;  Location: [place];  Service: Orthopedics;  Laterality: Right;  . AMPUTATION Left 12/02/2018   Procedure: LEFT TRANSMETATARSAL AMPUTATION AND NEGATIVE PRESSURE WOUND VAC PLACEMENT;  Surgeon: [name], MD;  Location: [place];  Service: Orthopedics;  Laterality: Left;  . AMPUTATION Left 12/18/2018   Procedure: LEFT BELOW KNEE AMPUTATION;  Surgeon: [name], MD;  Location: [place];  Service:  Orthopedics;  Laterality: Left;  . APPENDECTOMY  1943  . BELOW KNEE LEG AMPUTATION Left 12/18/2018  . CATARACT EXTRACTION  2012   x2  . COLONOSCOPY N/A 11/01/2014   Procedure: COLONOSCOPY;  Surgeon: Aviva Signs, MD;  [facility];  Service: Gastroenterology;  Laterality: N/A;  . ESOPHAGOGASTRODUODENOSCOPY N/A 11/01/2014   Procedure: ESOPHAGOGASTRODUODENOSCOPY (EGD);  Surgeon: Aviva Signs, MD;  [facility];  Service: Gastroenterology;  Laterality: N/A;  . HERNIA REPAIR Left   . INGUINAL HERNIA REPAIR Right 03/01/2013   Procedure: RIGHT INGUINAL HERNIORRHAPHY;  Surgeon: Jamesetta So, MD;  Location: [facility];  Service: General;  Laterality: Right;  . INSERTION OF MESH Right 03/01/2013   Procedure: INSERTION OF MESH;  Surgeon: Jamesetta So, MD;  Location: [facility];  Service: General;  Laterality: Right;  . LOWER EXTREMITY ANGIOGRAPHY Right 08/25/2018   Procedure: LOWER EXTREMITY ANGIOGRAPHY;  Surgeon: Wellington Hampshire, MD;  Location: [place] [facility];  Service: Cardiovascular;  Laterality: Right;  . LOWER EXTREMITY ANGIOGRAPHY N/A 11/25/2018   Procedure: LOWER EXTREMITY ANGIOGRAPHY;  Surgeon: Wellington Hampshire, MD;  Location: [place] [facility];  Service: Cardiovascular;  Laterality: N/A;  . NM MYOCAR PERF WALL MOTION  06/05/2010   no significant ischemia  . PERIPHERAL VASCULAR ATHERECTOMY Right 08/03/2018   Procedure: PERIPHERAL VASCULAR ATHERECTOMY;  Surgeon: Lorretta Harp, MD;  Location: [place] [facility];  Service:  Cardiovascular;  Laterality: Right;  . PERIPHERAL VASCULAR ATHERECTOMY Left 11/23/2018   Procedure: PERIPHERAL VASCULAR ATHERECTOMY;  Surgeon: Lorretta Harp, MD;  Location: [place] [facility];  Service: Cardiovascular;  Laterality: Left;  sfa with dc balloon  . PERIPHERAL VASCULAR BALLOON ANGIOPLASTY Left 11/25/2018   Procedure: PERIPHERAL VASCULAR BALLOON ANGIOPLASTY;  Surgeon: Wellington Hampshire, MD;  Location: [place] [facility];  Service:  Cardiovascular;  Laterality: Left;  popliteal  . PERIPHERAL VASCULAR INTERVENTION Left 11/25/2018   Procedure: PERIPHERAL VASCULAR INTERVENTION;  Surgeon: Wellington Hampshire, MD;  Location: [place] [facility];  Service: Cardiovascular;  Laterality: Left;  tibial peroneal trunk and peroneal stents   . stent  06/14/2010   left leg  . STUMP REVISION Left 01/20/2019   Procedure: REVISION LEFT BELOW KNEE AMPUTATION;  Surgeon: [name], MD;  Location: [place];  Service: Orthopedics;  Laterality: Left;  . US ECHOCARDIOGRAPHY  01/21/2006   moderate mitral annular ca+, mild MR, AOV moderately sclerotic.    There were no vitals filed for this visit.   Subjective Assessment - 06/24/19 1100    Subjective He has urology appt today.    Patient is accompained by: Family member   son, [name]   Pertinent History Bil TTAs, DM2, PAD, HTN, CHF, A-Fib, TIA, CKDst3    Patient Stated Goals walk with prostheses in home & in/out house. Son would like him to be able to toilet & get in/out bed by himself.    Currently in Pain? No/denies                             [facility] Adult PT Treatment/Exercise - 06/24/19 1100      Transfers   Transfers Sit to Stand;Stand to Sit;Squat Pivot Transfers;Lateral/Scoot Transfers;Stand Pivot Transfers    Sit to Stand 3: Mod assist;With upper extremity assist;With armrests;From chair/3-in-1;Other (comment)   to RW being stabilized by PT   Sit to Stand Details Manual facilitation for weight shifting;Verbal cues for safe use of DME/AE;Verbal cues for technique;Verbal cues for sequencing    Sit to Stand: Patient Percentage 70%    Stand to Sit 4: Min assist;With upper extremity assist;With armrests;To chair/3-in-1;Other (comment)   from RW   Stand to Sit Details (indicate cue type and reason) Verbal cues for technique;Verbal cues for sequencing;Manual facilitation for weight shifting;Verbal cues for safe use of DME/AE    Squat Pivot Transfers 3: Mod assist;With  upper extremity assistance   w/c <> Nustep with bil. prostheses   Squat Pivot Transfer Details (indicate cue type and reason) tactile & verbal cues on weight shift over feet prior to pivoting.       Ambulation/Gait   Ambulation/Gait Yes    Ambulation/Gait Assistance 2: Max assist;3: Mod assist   2nd person supervision,maxA initial 9' then modA, maxA turn   Ambulation/Gait Assistance Details manual/tactile & verbal cues on upright posture & wt shift.  1st rep straight path, 2nd 5' + 90* turn to sit in chair to right.  3rd rep 5' + 90* turn to chair on left.     Ambulation Distance (Feet) 20 Feet   20' straight path, 5' + 90* turn 2 reps   Assistive device Prostheses;Rolling walker    Gait Pattern Step-to pattern;Decreased step length - right;Decreased step length - left;Decreased hip/knee flexion - right;Decreased hip/knee flexion - left;Right foot flat;Left foot flat;Right flexed knee in stance;Left flexed knee in stance;Shuffle;Lateral hip instability;Trunk flexed;Poor foot  clearance - left;Poor foot clearance - right    Ambulation Surface Level;Indoor      Therapeutic Activites    Other Therapeutic Activities seated in w/c at edge so no back support with Bil prosthetic heels supported on ground: 5 minutes tossing 2# weighted ball 5' with supervision with verbal cues for upright posture.  2nd golf swings 10 reps, 3rd tennis raquet hitting badminton birdies 10       Knee/Hip Exercises: Stretches   Passive Hamstring Stretch --    Passive Hamstring Stretch Limitations --    Hip Flexor Stretch --    Hip Flexor Stretch Limitations --      Knee/Hip Exercises: Aerobic   Nustep Level 5 BUEs & BLEs 7 min with verbal & manual cues on full ROM   seat 12     Knee/Hip Exercises: Machines for Strengthening   Cybex Leg Press --      Prosthetics   Prosthetic Care Comments  continue trial wear 1 hr at beginning of each day with no shrinker under liner then continue wear with shrinker under liner.       Current prosthetic wear tolerance (days/week)  daily    Current prosthetic wear tolerance (#hours/day)  most of awake hours    Edema pitting edema    Residual limb condition  no open areas, normal color & temperature,     Education Provided Skin check;Residual limb care;Proper wear schedule/adjustment    Person(s) Educated Patient;Child(ren)    Education Method Explanation;Verbal cues    Education Method Verbalized understanding;Verbal cues required;Needs further instruction                  PT Education - 06/24/19 1144    Education Details [place] program with [facility] for [employer] to augment PT with 1-2 days/ wk recumbent stepper    Person(s) Educated Patient;Child(ren)    Methods Explanation;Verbal cues    Comprehension Verbalized understanding;Need further instruction            PT Short Term Goals - 06/24/19 1715      PT SHORT TERM GOAL #1   Title Patient & son report transferring at home with sliding board & prostheses with patient participated.  (All updated STGs Target Date: 06/29/2019)    Baseline MET 06/24/2019    Time 4    Period Weeks    Status Achieved    Target Date 06/29/19      PT SHORT TERM GOAL #2   Title Patient tolerates prostheses wear daily >90% of awake hours without skin issues.    Baseline MET 06/24/2019    Time 4    Period Weeks    Status Achieved    Target Date 06/29/19      PT SHORT TERM GOAL #3   Title Stand -pivot transfer with RW between w/c & chair with armrests with modA    Baseline Partially MET 06/24/2019    Time 4    Period Weeks    Status Partially Met    Target Date 06/29/19      PT SHORT TERM GOAL #4   Title Standing balance with walker support static 2 minutes with supervision and reaches 10" anteriorly with minA    Time 4    Period Weeks    Status On-going    Target Date 06/29/19      PT SHORT TERM GOAL #5   Title Patient ambulates 20' & turns 90* to position to sit with RW & prostheses with modA (2  people  for safety).    Time 4    Period Weeks    Status On-going    Target Date 06/29/19             PT Long Term Goals - 04/19/19 2144      PT LONG TERM GOAL #1   Title Patient and family verbalize and demonstrate understanding of proper prosthetic care to enable safe utilization of prostheses. (All LTGs Target Date: 09/24/2019)    Time 6    Period Months    Status On-going    Target Date 09/24/19      PT LONG TERM GOAL #2   Title Patient tolerates wear of bilateral prostheses >90% of awake hours without skin issues or limb pain to enable functional potential during his day.    Time 6    Period Months    Status On-going    Target Date 09/24/19      PT LONG TERM GOAL #3   Title Squat pivot transfer bed to w/c or chair to w/c with prostheses modified independent.    Time 6    Period Months    Status On-going    Target Date 09/24/19      PT LONG TERM GOAL #4   Title Sit to / from stand elevated w/c to walker with prostheses with supervision.    Time 6    Period Months    Status On-going    Target Date 09/24/19      PT LONG TERM GOAL #5   Title Standing balance with walker support with bilateral prostheses static stance for 5 minutes, scanning environment and reaches 2" anteriorly with supervision.    Time 6    Period Months    Status On-going    Target Date 09/24/19      PT LONG TERM GOAL #6   Title Patient ambulates 31' between 2 chairs including turning 90* to position to sit with RW & prostheses with minA from family safely.    Time 6    Period Months    Status On-going    Target Date 09/24/19                 Plan - 06/24/19 1717    Clinical Impression Statement PT discussed adding exercising at [facility] 1-2 days / wk in addition to PT to improve endurance, strength & flexibility.  PT added seated golf & tennis swings as home acvites and they appear to understand.    Personal Factors and Comorbidities Age;Comorbidity 3+;Fitness;Time since onset of  injury/illness/exacerbation    Comorbidities Bil TTAs, DM2, PAD, HTN, CHF, A-Fib, TIA, CKDst3,    Examination-Activity Limitations Locomotion Level;Sit;Stand;Toileting;Transfers    Examination-Participation Restrictions Community Activity    Stability/Clinical Decision Making Evolving/Moderate complexity    Rehab Potential Good    PT Frequency 2x / week    PT Duration Other (comment)   25 weeks (6 months)   PT Treatment/Interventions ADLs/Self Care Home Management;DME Instruction;Gait training;Stair training;Functional mobility training;Therapeutic activities;Therapeutic exercise;Balance training;Neuromuscular re-education;Patient/family education;Prosthetic Training;Manual techniques;Passive range of motion;Vestibular    PT Next Visit Plan check remaining updated STGs and do recertification, transfers for stand pivot, Nustep, leg press, standing balance & gait with RW when 2nd person available to assist, golf swings seated on mat table with patient in higher position    Consulted and Agree with Plan of Care Patient;Family member/caregiver    Family Member Consulted dtr, [name] & son, [name]  Patient will benefit from skilled therapeutic intervention in order to improve the following deficits and impairments:  Abnormal gait, Cardiopulmonary status limiting activity, Decreased activity tolerance, Decreased balance, Decreased endurance, Decreased knowledge of use of DME, Decreased mobility, Decreased range of motion, Decreased scar mobility, Decreased strength, Increased edema, Impaired flexibility, Impaired UE functional use, Postural dysfunction, Prosthetic Dependency, Pain  Visit Diagnosis: Other abnormalities of gait and mobility  Unsteadiness on feet  Abnormal posture  Weakness generalized  Contracture of muscle, multiple sites  Stiffness of right knee, not elsewhere classified  Stiffness of left knee, not elsewhere classified     Problem List Patient Active  Problem List   Diagnosis Date Noted  . Weakness generalized 04/05/2019  . S/P BKA (below knee amputation) unilateral, left ([place]) 02/15/2019  . Pressure injury of skin 02/03/2019  . TIA (transient ischemic attack) 02/03/2019  . Transient speech disturbance 02/02/2019  . Chronic indwelling Foley catheter 01/22/2019  . Postoperative wound infection 01/20/2019  . Normocytic anemia 01/18/2019  . Cellulitis 01/18/2019  . Infection of deep incisional surgical site after procedure   . Abnormal urinalysis   . Labile blood glucose   . Urinary retention   . S/P bilateral BKA (below knee amputation) ([place])   . Acute blood loss anemia   . Urinary retention due to benign prostatic hyperplasia 12/28/2018  . Unilateral complete BKA, left, initial encounter ([place]) 12/25/2018  . Dehiscence of amputation stump ([place])   . History of transmetatarsal amputation of left foot ([place]) 12/09/2018  . Critical limb ischemia with history of revascularization of same extremity   . Gangrene of left foot ([place])   . Diabetic ulcer of toe of left foot associated with type 2 diabetes mellitus ([place])   . Cellulitis of left lower extremity   . Toe infection 11/18/2018  . Peripheral edema   . Acute on chronic diastolic CHF (congestive heart failure) ([place]) 11/09/2018  . S/P BKA (below knee amputation) unilateral, right ([place]) 10/05/2018  . Hyponatremia   . Essential hypertension   . Postoperative pain   . Pain of left heel   . Unable to maintain weight-bearing   . Type 2 diabetes mellitus with diabetic peripheral angiopathy with gangrene ([place])   . Anemia due to acute blood loss   . Chronic kidney disease, stage 3a   . Acquired absence of right leg below knee ([place]) 09/07/2018  . Gangrene of right foot ([place])   . DNR (do not resuscitate) discussion   . Goals of care, counseling/discussion   . Palliative care by specialist   . Type 2 diabetes mellitus with vascular disease ([place])   . Severe protein-calorie malnutrition ([place])    . Ischemic foot 08/20/2018  . Critical lower limb ischemia 08/03/2018  . Type 2 diabetes mellitus with foot ulcer, without long-term current use of insulin ([place])   . Gastroesophageal reflux disease   . Cellulitis of great toe of right foot 05/29/2018  . Atrial fibrillation, chronic   . Chest pain 07/25/2017  . PAF (paroxysmal atrial fibrillation) ([place]) 07/25/2017  . BPH (benign prostatic hyperplasia) 07/25/2017  . Shortness of breath 05/11/2017  . Hypothyroidism 05/11/2017  . Chronic diastolic CHF (congestive heart failure) ([place]) 05/11/2017  . Moderate aortic stenosis 06/12/2016  . RBBB 06/12/2016  . Peripheral vascular disease ([place]) 08/04/2012  . Essential hypertension, benign 08/04/2012  . Hyperlipidemia 08/04/2012  . Dizziness 08/04/2012    Jamey Reas PT, DPT 06/24/2019, 5:25 PM  [facility] [address] [place], [place], [address]  Phone: [phone]   Fax:  [phone]  Name: [name] MRN: [id number] Date of Birth: 08/12/1927

## 2019-06-28 ENCOUNTER — Encounter: Payer: Self-pay | Admitting: Physical Therapy

## 2019-06-28 ENCOUNTER — Other Ambulatory Visit: Payer: Self-pay

## 2019-06-28 ENCOUNTER — Ambulatory Visit (INDEPENDENT_AMBULATORY_CARE_PROVIDER_SITE_OTHER): Payer: Medicare Other | Admitting: Physical Therapy

## 2019-06-28 DIAGNOSIS — R2681 Unsteadiness on feet: Secondary | ICD-10-CM

## 2019-06-28 DIAGNOSIS — Z9181 History of falling: Secondary | ICD-10-CM

## 2019-06-28 DIAGNOSIS — R293 Abnormal posture: Secondary | ICD-10-CM

## 2019-06-28 DIAGNOSIS — R531 Weakness: Secondary | ICD-10-CM | POA: Diagnosis not present

## 2019-06-28 DIAGNOSIS — R2689 Other abnormalities of gait and mobility: Secondary | ICD-10-CM

## 2019-06-28 DIAGNOSIS — M25661 Stiffness of right knee, not elsewhere classified: Secondary | ICD-10-CM

## 2019-06-28 DIAGNOSIS — M6249 Contracture of muscle, multiple sites: Secondary | ICD-10-CM

## 2019-06-28 DIAGNOSIS — M25662 Stiffness of left knee, not elsewhere classified: Secondary | ICD-10-CM

## 2019-06-28 NOTE — Therapy (Signed)
[facility] [address] [place], [place], [address] Phone: [phone]   Fax:  [phone]  Physical Therapy Treatment & Recertification  Patient Details  Name: [name] MRN: [id number] Date of Birth: 84-17-1929 Referring Provider (PT): [name], MD   Encounter Date: 06/28/2019   PT End of Session - 06/28/19 1145    Visit Number 25    Number of Visits 50    Date for PT Re-Evaluation 06/30/19    Authorization Type [employer] Medicare    Authorization Time Period $30 co-pay    Progress Note Due on Visit 30    PT Start Time 1145    PT Stop Time 1228    PT Time Calculation (min) 43 min    Equipment Utilized During Treatment Gait belt    Activity Tolerance Patient tolerated treatment well    Behavior During Therapy WFL for tasks assessed/performed           Past Medical History:  Diagnosis Date  . Arthritis   . Borderline diabetic   . Diabetes mellitus without complication ([place])   . Glaucoma   . Hyperlipidemia   . Hypertension   . PVD (peripheral vascular disease) ([place])   . Sleep apnea    Cpap ordered but doesnt use  . Urinary retention due to benign prostatic hyperplasia 12/28/2018    Past Surgical History:  Procedure Laterality Date  . ABDOMINAL AORTOGRAM W/LOWER EXTREMITY N/A 07/27/2018   Procedure: ABDOMINAL AORTOGRAM W/LOWER EXTREMITY;  Surgeon: Lorretta Harp, MD;  Location: [place] [facility];  Service: Cardiovascular;  Laterality: N/A;  . AMPUTATION Right 08/28/2018   Procedure: RIGHT BELOW KNEE AMPUTATION;  Surgeon: [name], MD;  Location: [place];  Service: Orthopedics;  Laterality: Right;  . AMPUTATION Left 12/02/2018   Procedure: LEFT TRANSMETATARSAL AMPUTATION AND NEGATIVE PRESSURE WOUND VAC PLACEMENT;  Surgeon: [name], MD;  Location: [place];  Service: Orthopedics;  Laterality: Left;  . AMPUTATION Left 12/18/2018   Procedure: LEFT BELOW KNEE AMPUTATION;  Surgeon: [name], MD;  Location: [place];   Service: Orthopedics;  Laterality: Left;  . APPENDECTOMY  1943  . BELOW KNEE LEG AMPUTATION Left 12/18/2018  . CATARACT EXTRACTION  2012   x2  . COLONOSCOPY N/A 11/01/2014   Procedure: COLONOSCOPY;  Surgeon: Aviva Signs, MD;  [facility];  Service: Gastroenterology;  Laterality: N/A;  . ESOPHAGOGASTRODUODENOSCOPY N/A 11/01/2014   Procedure: ESOPHAGOGASTRODUODENOSCOPY (EGD);  Surgeon: Aviva Signs, MD;  [facility];  Service: Gastroenterology;  Laterality: N/A;  . HERNIA REPAIR Left   . INGUINAL HERNIA REPAIR Right 03/01/2013   Procedure: RIGHT INGUINAL HERNIORRHAPHY;  Surgeon: Jamesetta So, MD;  [facility];  Service: General;  Laterality: Right;  . INSERTION OF MESH Right 03/01/2013   Procedure: INSERTION OF MESH;  Surgeon: Jamesetta So, MD;  [facility];  Service: General;  Laterality: Right;  . LOWER EXTREMITY ANGIOGRAPHY Right 08/25/2018   Procedure: LOWER EXTREMITY ANGIOGRAPHY;  Surgeon: Wellington Hampshire, MD;  Location: [place] [facility];  Service: Cardiovascular;  Laterality: Right;  . LOWER EXTREMITY ANGIOGRAPHY N/A 11/25/2018   Procedure: LOWER EXTREMITY ANGIOGRAPHY;  Surgeon: Wellington Hampshire, MD;  Location: [place] [facility];  Service: Cardiovascular;  Laterality: N/A;  . NM MYOCAR PERF WALL MOTION  06/05/2010   no significant ischemia  . PERIPHERAL VASCULAR ATHERECTOMY Right 08/03/2018   Procedure: PERIPHERAL VASCULAR ATHERECTOMY;  Surgeon: Lorretta Harp, MD;  Location: [place] [facility];  Service: Cardiovascular;  Laterality: Right;  . PERIPHERAL VASCULAR ATHERECTOMY Left 11/23/2018   Procedure: PERIPHERAL VASCULAR ATHERECTOMY;  Surgeon: Lorretta Harp, MD;  Location: [place] [facility];  Service: Cardiovascular;  Laterality: Left;  sfa with dc balloon  . PERIPHERAL VASCULAR BALLOON ANGIOPLASTY Left 11/25/2018   Procedure: PERIPHERAL VASCULAR BALLOON ANGIOPLASTY;  Surgeon: Wellington Hampshire, MD;  Location: [place] [facility];  Service:  Cardiovascular;  Laterality: Left;  popliteal  . PERIPHERAL VASCULAR INTERVENTION Left 11/25/2018   Procedure: PERIPHERAL VASCULAR INTERVENTION;  Surgeon: Wellington Hampshire, MD;  Location: [place] [facility];  Service: Cardiovascular;  Laterality: Left;  tibial peroneal trunk and peroneal stents   . stent  06/14/2010   left leg  . STUMP REVISION Left 01/20/2019   Procedure: REVISION LEFT BELOW KNEE AMPUTATION;  Surgeon: [name], MD;  Location: [place];  Service: Orthopedics;  Laterality: Left;  . US ECHOCARDIOGRAPHY  01/21/2006   moderate mitral annular ca+, mild MR, AOV moderately sclerotic.    There were no vitals filed for this visit.   Subjective Assessment - 06/28/19 1145    Subjective He has been wearing liner without shrinker 1hr first day, forgot 2nd day so 2-3 hrs with slight rash so skipped 3rd day.    Patient is accompained by: Family member   son, [name]   Pertinent History Bil TTAs, DM2, PAD, HTN, CHF, A-Fib, TIA, CKDst3    Patient Stated Goals walk with prostheses in home & in/out house. Son would like him to be able to toilet & get in/out bed by himself.    Currently in Pain? Yes    Pain Score 7     Pain Location Wrist    Pain Orientation Left    Pain Descriptors / Indicators Aching;Sore    Pain Type Acute pain    Pain Onset In the past 7 days    Aggravating Factors  movement, uncertain of onset    Pain Relieving Factors rest                             [facility] Adult PT Treatment/Exercise - 06/28/19 1145      Transfers   Transfers Sit to Stand;Stand to Sit;Squat Pivot Transfers;Lateral/Scoot Transfers;Stand Pivot Transfers    Sit to Stand 3: Mod assist;With upper extremity assist;With armrests;From chair/3-in-1;Other (comment)   to RW being stabilized by PT   Sit to Stand Details Manual facilitation for weight shifting;Verbal cues for safe use of DME/AE;Verbal cues for technique;Verbal cues for sequencing    Sit to Stand: Patient Percentage  70%    Stand to Sit 4: Min assist;With upper extremity assist;With armrests;To chair/3-in-1;Other (comment)   from RW   Stand to Sit Details (indicate cue type and reason) Verbal cues for technique;Verbal cues for sequencing;Manual facilitation for weight shifting;Verbal cues for safe use of DME/AE    Squat Pivot Transfers 3: Mod assist;With upper extremity assistance   w/c <> Nustep with bil. prostheses   Comments Sit to/from stand w/c to sink pulling on sink with modA.       Ambulation/Gait   Ambulation/Gait Yes    Ambulation/Gait Assistance 2: Max assist;3: Mod assist   2nd person supervision,maxA initial 64' then modA, maxA turn   Ambulation/Gait Assistance Details manual/tactile & verbal cues on upright posture & wt shift.  Maximal Assist & cues for 90* turns.     Ambulation Distance (Feet) 15 Feet   15' +  90* turn and 10' + 180* turn   Assistive device Prostheses;Rolling walker    Gait Pattern Step-to pattern;Decreased step length - right;Decreased step length - left;Decreased hip/knee flexion - right;Decreased hip/knee flexion - left;Right foot flat;Left foot flat;Right flexed knee in stance;Left flexed knee in stance;Shuffle;Lateral hip instability;Trunk flexed;Poor foot clearance - left;Poor foot clearance - right    Ambulation Surface Level;Indoor      Therapeutic Activites    Other Therapeutic Activities seated on 24" bar stool at sink without UE support with BLE support on floor with tactile cues:  static 30 seconds with cues for upright trunk, scanning rt/lt 3 reps ea, moving cones from one side sink to other 5 reps LUE & 5 resp RUE, modified sit-up (leaning back to PT 2" posterior then sitting up);   with UE assist pulling on sink with minA /guard 24" stool to stand 4 reps.       Knee/Hip Exercises: Aerobic   Nustep Level 5 BUEs & BLEs 8 min with verbal & manual cues on full ROM   seat 12     Knee/Hip Exercises: Machines for Strengthening   Cybex Leg Press Shuttle Leg Press Back  at 45* BLEs 87# 15 reps LLE 50# 10 reps RLE 50# 5 reps 43# 5 reps      Prosthetics   Prosthetic Care Comments  continue trial wear 1 hr at beginning of each day with no shrinker under liner then continue wear with shrinker under liner.      Current prosthetic wear tolerance (days/week)  daily    Current prosthetic wear tolerance (#hours/day)  most of awake hours    Edema pitting edema    Residual limb condition  no open areas, normal color & temperature,     Education Provided Skin check;Residual limb care;Proper wear schedule/adjustment    Person(s) Educated Patient;Child(ren)    Education Method Explanation;Verbal cues    Education Method Verbalized understanding                    PT Short Term Goals - 06/28/19 1335      PT SHORT TERM GOAL #1   Title Patient & son report transferring at home with sliding board & prostheses with patient participated.  (All updated STGs Target Date: 06/29/2019)    Baseline MET 06/24/2019    Time 4    Period Weeks    Status Achieved    Target Date 06/29/19      PT SHORT TERM GOAL #2   Title Patient tolerates prostheses wear daily >90% of awake hours without skin issues.    Baseline MET 06/24/2019    Time 4    Period Weeks    Status Achieved    Target Date 06/29/19      PT SHORT TERM GOAL #3   Title Stand -pivot transfer with RW between w/c & chair with armrests with modA    Baseline Partially MET 06/24/2019    Time 4    Period Weeks    Status Partially Met    Target Date 06/29/19      PT SHORT TERM GOAL #4   Title Standing balance with walker support static 2 minutes with supervision and reaches 10" anteriorly with minA    Baseline NOT MET 06/28/2019    Time 4    Period Weeks    Status Not Met    Target Date 06/29/19      PT SHORT TERM GOAL #5   Title Patient ambulates 20' &  turns 90* to position to sit with RW & prostheses with modA (2 people for safety).    Baseline Partially MET 06/28/2019  Patient ambulates 15' with modA  straight path and 90* turn to position to sit with maxA.    Time 4    Period Weeks    Status Partially Met    Target Date 06/29/19             PT Short Term Goals - 06/28/19 1349      PT SHORT TERM GOAL #1   Title Patient performs level surface direct contact scooting transfer with bil. prostheses with minA.  (All STGs Target Date: 07/30/2019)    Time 1    Period Months    Status New    Target Date 07/30/19      PT SHORT TERM GOAL #2   Title Patient tolerates prostheses wear daily >90% of awake hours without skin issues.    Time 1    Period Months    Status On-going    Target Date 07/30/19      PT SHORT TERM GOAL #3   Title Stand -pivot transfer with RW between w/c & chair with armrests with modA    Time 1    Period Months    Status On-going    Target Date 07/30/19      PT SHORT TERM GOAL #4   Title Standing balance with walker support static 2 minutes with supervision and reaches 10" anteriorly with minA    Time 1    Period Months    Status On-going    Target Date 07/30/19      PT SHORT TERM GOAL #5   Title Patient ambulates 20' & turns 90* to position to sit with RW & prostheses with modA.    Time 1    Period Weeks    Status On-going    Target Date 07/30/19             PT Long Term Goals - 06/28/19 1337      PT LONG TERM GOAL #1   Title Patient and family verbalize and demonstrate understanding of proper prosthetic care to enable safe utilization of prostheses. (All LTGs Target Date: 09/24/2019)    Time 3    Period Months    Status On-going    Target Date 09/24/19      PT LONG TERM GOAL #2   Title Patient tolerates wear of bilateral prostheses >90% of awake hours without skin issues or limb pain to enable functional potential during his day.    Time 3    Period Months    Status On-going    Target Date 09/24/19      PT LONG TERM GOAL #3   Title Squat pivot transfer bed to w/c or chair to w/c with prostheses with supervision.    Time 3    Period  Months    Status Revised    Target Date 09/24/19      PT LONG TERM GOAL #4   Title Sit to / from stand elevated w/c to walker with prostheses with minA    Time 3    Period Months    Status Revised    Target Date 09/24/19      PT LONG TERM GOAL #5   Title Standing balance with walker support with bilateral prostheses static stance for 2 minutes, scanning environment and reaches 2" anteriorly with supervision.    Time 3    Period Months  Status Revised    Target Date 09/24/19      PT LONG TERM GOAL #6   Title Patient ambulates 25' between 2 chairs including turning 90* to position to sit with RW & prostheses with minA from family safely.    Time 3    Period Months    Status Revised    Target Date 09/24/19                 Plan - 06/28/19 1145    Clinical Impression Statement Patient is making slowly staady progress with mobility with his bilateral BKA prostheses. He needs additional skilled medically necessary PT  to improve his function further and decrase burden of care on family with lower fall risk. His progress is slowed by 86yo age with multiple co-morbidities.    Personal Factors and Comorbidities Age;Comorbidity 3+;Fitness;Time since onset of injury/illness/exacerbation    Comorbidities Bil TTAs, DM2, PAD, HTN, CHF, A-Fib, TIA, CKDst3,    Examination-Activity Limitations Locomotion Level;Sit;Stand;Toileting;Transfers    Examination-Participation Restrictions Community Activity    Stability/Clinical Decision Making Evolving/Moderate complexity    Rehab Potential Good    PT Frequency 2x / week    PT Duration 12 weeks   3 months   PT Treatment/Interventions ADLs/Self Care Home Management;DME Instruction;Gait training;Stair training;Functional mobility training;Therapeutic activities;Therapeutic exercise;Balance training;Neuromuscular re-education;Patient/family education;Prosthetic Training;Manual techniques;Passive range of motion;Vestibular    PT Next Visit Plan  work towards updated STGs, balance activites seated on 24" stool at sink,  transfers for stand pivot, Nustep, leg press, standing balance & gait with RW when 2nd person available to assist, golf swings seated on mat table with patient in higher position    Consulted and Agree with Plan of Care Patient;Family member/caregiver    Family Member Consulted dtr, [name] & son, Thinh Cuccaro           Patient will benefit from skilled therapeutic intervention in order to improve the following deficits and impairments:  Abnormal gait, Cardiopulmonary status limiting activity, Decreased activity tolerance, Decreased balance, Decreased endurance, Decreased knowledge of use of DME, Decreased mobility, Decreased range of motion, Decreased scar mobility, Decreased strength, Increased edema, Impaired flexibility, Impaired UE functional use, Postural dysfunction, Prosthetic Dependency, Pain  Visit Diagnosis: Unsteadiness on feet  Other abnormalities of gait and mobility  Abnormal posture  Weakness generalized  History of fall  Stiffness of right knee, not elsewhere classified  Stiffness of left knee, not elsewhere classified  Contracture of muscle, multiple sites     Problem List Patient Active Problem List   Diagnosis Date Noted  . Weakness generalized 04/05/2019  . S/P BKA (below knee amputation) unilateral, left ([place]) 02/15/2019  . Pressure injury of skin 02/03/2019  . TIA (transient ischemic attack) 02/03/2019  . Transient speech disturbance 02/02/2019  . Chronic indwelling Foley catheter 01/22/2019  . Postoperative wound infection 01/20/2019  . Normocytic anemia 01/18/2019  . Cellulitis 01/18/2019  . Infection of deep incisional surgical site after procedure   . Abnormal urinalysis   . Labile blood glucose   . Urinary retention   . S/P bilateral BKA (below knee amputation) ([place])   . Acute blood loss anemia   . Urinary retention due to benign prostatic hyperplasia 12/28/2018  .  Unilateral complete BKA, left, initial encounter ([place]) 12/25/2018  . Dehiscence of amputation stump ([place])   . History of transmetatarsal amputation of left foot ([place]) 12/09/2018  . Critical limb ischemia with history of revascularization of same extremity   . Gangrene of left foot (  [place])   . Diabetic ulcer of toe of left foot associated with type 2 diabetes mellitus ([place])   . Cellulitis of left lower extremity   . Toe infection 11/18/2018  . Peripheral edema   . Acute on chronic diastolic CHF (congestive heart failure) ([place]) 11/09/2018  . S/P BKA (below knee amputation) unilateral, right ([place]) 10/05/2018  . Hyponatremia   . Essential hypertension   . Postoperative pain   . Pain of left heel   . Unable to maintain weight-bearing   . Type 2 diabetes mellitus with diabetic peripheral angiopathy with gangrene ([place])   . Anemia due to acute blood loss   . Chronic kidney disease, stage 3a   . Acquired absence of right leg below knee ([place]) 09/07/2018  . Gangrene of right foot ([place])   . DNR (do not resuscitate) discussion   . Goals of care, counseling/discussion   . Palliative care by specialist   . Type 2 diabetes mellitus with vascular disease ([place])   . Severe protein-calorie malnutrition ([place])   . Ischemic foot 08/20/2018  . Critical lower limb ischemia 08/03/2018  . Type 2 diabetes mellitus with foot ulcer, without long-term current use of insulin ([place])   . Gastroesophageal reflux disease   . Cellulitis of great toe of right foot 05/29/2018  . Atrial fibrillation, chronic   . Chest pain 07/25/2017  . PAF (paroxysmal atrial fibrillation) ([place]) 07/25/2017  . BPH (benign prostatic hyperplasia) 07/25/2017  . Shortness of breath 05/11/2017  . Hypothyroidism 05/11/2017  . Chronic diastolic CHF (congestive heart failure) ([place]) 05/11/2017  . Moderate aortic stenosis 06/12/2016  . RBBB 06/12/2016  . Peripheral vascular disease ([place]) 08/04/2012  . Essential hypertension, benign 08/04/2012    . Hyperlipidemia 08/04/2012  . Dizziness 08/04/2012    Jamey Reas PT, DPT 06/28/2019, 1:47 PM  [facility] [address] [place], [place], [address] Phone: [phone]   Fax:  [phone]  Name: JIMMYLEE RATTERREE MRN: [id number] Date of Birth: 1927/04/20

## 2019-06-29 DIAGNOSIS — E118 Type 2 diabetes mellitus with unspecified complications: Secondary | ICD-10-CM | POA: Diagnosis not present

## 2019-06-29 DIAGNOSIS — K219 Gastro-esophageal reflux disease without esophagitis: Secondary | ICD-10-CM | POA: Diagnosis not present

## 2019-06-29 DIAGNOSIS — L309 Dermatitis, unspecified: Secondary | ICD-10-CM | POA: Diagnosis not present

## 2019-06-29 DIAGNOSIS — I10 Essential (primary) hypertension: Secondary | ICD-10-CM | POA: Diagnosis not present

## 2019-07-01 ENCOUNTER — Ambulatory Visit (INDEPENDENT_AMBULATORY_CARE_PROVIDER_SITE_OTHER): Payer: Medicare Other | Admitting: Physical Therapy

## 2019-07-01 ENCOUNTER — Other Ambulatory Visit: Payer: Self-pay

## 2019-07-01 DIAGNOSIS — R531 Weakness: Secondary | ICD-10-CM | POA: Diagnosis not present

## 2019-07-01 DIAGNOSIS — R2681 Unsteadiness on feet: Secondary | ICD-10-CM | POA: Diagnosis not present

## 2019-07-01 DIAGNOSIS — R2689 Other abnormalities of gait and mobility: Secondary | ICD-10-CM

## 2019-07-01 DIAGNOSIS — M6249 Contracture of muscle, multiple sites: Secondary | ICD-10-CM

## 2019-07-01 DIAGNOSIS — R293 Abnormal posture: Secondary | ICD-10-CM | POA: Diagnosis not present

## 2019-07-01 DIAGNOSIS — Z9181 History of falling: Secondary | ICD-10-CM | POA: Diagnosis not present

## 2019-07-01 DIAGNOSIS — M25661 Stiffness of right knee, not elsewhere classified: Secondary | ICD-10-CM

## 2019-07-01 DIAGNOSIS — M25662 Stiffness of left knee, not elsewhere classified: Secondary | ICD-10-CM

## 2019-07-01 NOTE — Therapy (Signed)
[facility] Physical Therapy [address] [place], [place], [address] Phone: [phone]   Fax:  [phone]  Physical Therapy Treatment  Patient Details  Name: [name] MRN: [id number] Date of Birth: 1927-12-13 Referring Provider (PT): [name], MD   Encounter Date: 07/01/2019   PT End of Session - 07/01/19 1414    Visit Number 26    Number of Visits 50    Date for PT Re-Evaluation 06/30/19    Authorization Type [employer] Medicare    Authorization Time Period $30 co-pay    Progress Note Due on Visit 30    PT Start Time 1148    PT Stop Time 1235    PT Time Calculation (min) 47 min    Equipment Utilized During Treatment Gait belt    Activity Tolerance Patient tolerated treatment well    Behavior During Therapy WFL for tasks assessed/performed           Past Medical History:  Diagnosis Date  . Arthritis   . Borderline diabetic   . Diabetes mellitus without complication ([place])   . Glaucoma   . Hyperlipidemia   . Hypertension   . PVD (peripheral vascular disease) ([place])   . Sleep apnea    Cpap ordered but doesnt use  . Urinary retention due to benign prostatic hyperplasia 12/28/2018    Past Surgical History:  Procedure Laterality Date  . ABDOMINAL AORTOGRAM W/LOWER EXTREMITY N/A 07/27/2018   Procedure: ABDOMINAL AORTOGRAM W/LOWER EXTREMITY;  Surgeon: Lorretta Harp, MD;  Location: [facility];  Service: Cardiovascular;  Laterality: N/A;  . AMPUTATION Right 08/28/2018   Procedure: RIGHT BELOW KNEE AMPUTATION;  Surgeon: [name], MD;  Location: [facility];  Service: Orthopedics;  Laterality: Right;  . AMPUTATION Left 12/02/2018   Procedure: LEFT TRANSMETATARSAL AMPUTATION AND NEGATIVE PRESSURE WOUND VAC PLACEMENT;  Surgeon: [name], MD;  Location: [facility];  Service: Orthopedics;  Laterality: Left;  . AMPUTATION Left 12/18/2018   Procedure: LEFT BELOW KNEE AMPUTATION;  Surgeon: [name], MD;  Location: [facility];  Service:  Orthopedics;  Laterality: Left;  . APPENDECTOMY  1943  . BELOW KNEE LEG AMPUTATION Left 12/18/2018  . CATARACT EXTRACTION  2012   x2  . COLONOSCOPY N/A 11/01/2014   Procedure: COLONOSCOPY;  Surgeon: Aviva Signs, MD;  [facility];  Service: Gastroenterology;  Laterality: N/A;  . ESOPHAGOGASTRODUODENOSCOPY N/A 11/01/2014   Procedure: ESOPHAGOGASTRODUODENOSCOPY (EGD);  Surgeon: Aviva Signs, MD;  [facility];  Service: Gastroenterology;  Laterality: N/A;  . HERNIA REPAIR Left   . INGUINAL HERNIA REPAIR Right 03/01/2013   Procedure: RIGHT INGUINAL HERNIORRHAPHY;  Surgeon: Jamesetta So, MD;  [facility]: [facility];  Service: General;  Laterality: Right;  . INSERTION OF MESH Right 03/01/2013   Procedure: INSERTION OF MESH;  Surgeon: Jamesetta So, MD;  Location: [facility];  Service: General;  Laterality: Right;  . LOWER EXTREMITY ANGIOGRAPHY Right 08/25/2018   Procedure: LOWER EXTREMITY ANGIOGRAPHY;  Surgeon: Wellington Hampshire, MD;  Location: [facility];  Service: Cardiovascular;  Laterality: Right;  . LOWER EXTREMITY ANGIOGRAPHY N/A 11/25/2018   Procedure: LOWER EXTREMITY ANGIOGRAPHY;  Surgeon: Wellington Hampshire, MD;  Location: [facility];  Service: Cardiovascular;  Laterality: N/A;  . NM MYOCAR PERF WALL MOTION  06/05/2010   no significant ischemia  . PERIPHERAL VASCULAR ATHERECTOMY Right 08/03/2018   Procedure: PERIPHERAL VASCULAR ATHERECTOMY;  Surgeon: Lorretta Harp, MD;  Location: [facility];  Service:  Cardiovascular;  Laterality: Right;  . PERIPHERAL VASCULAR ATHERECTOMY Left 11/23/2018   Procedure: PERIPHERAL VASCULAR ATHERECTOMY;  Surgeon: Lorretta Harp, MD;  Location: [facility];  Service: Cardiovascular;  Laterality: Left;  sfa with dc balloon  . PERIPHERAL VASCULAR BALLOON ANGIOPLASTY Left 11/25/2018   Procedure: PERIPHERAL VASCULAR BALLOON ANGIOPLASTY;  Surgeon: Wellington Hampshire, MD;  Location: [facility];  Service:  Cardiovascular;  Laterality: Left;  popliteal  . PERIPHERAL VASCULAR INTERVENTION Left 11/25/2018   Procedure: PERIPHERAL VASCULAR INTERVENTION;  Surgeon: Wellington Hampshire, MD;  Location: [facility];  Service: Cardiovascular;  Laterality: Left;  tibial peroneal trunk and peroneal stents   . stent  06/14/2010   left leg  . STUMP REVISION Left 01/20/2019   Procedure: REVISION LEFT BELOW KNEE AMPUTATION;  Surgeon: [name], MD;  Location: [facility];  Service: Orthopedics;  Laterality: Left;  . US ECHOCARDIOGRAPHY  01/21/2006   moderate mitral annular ca+, mild MR, AOV moderately sclerotic.    There were no vitals filed for this visit.   Subjective Assessment - 07/01/19 1401    Subjective relays some shoulder pain and wrist pain but no pain in his legs or issues with skin breakdown    Patient is accompained by: Family member   son, [name]   Pertinent History Bil TTAs, DM2, PAD, HTN, CHF, A-Fib, TIA, CKDst3    Patient Stated Goals walk with prostheses in home & in/out house. Son would like him to be able to toilet & get in/out bed by himself.    Pain Onset In the past 7 days              [facility] PT Assessment - 07/01/19 0001      Transfers   Transfers Sit to Stand;Stand to Sit;Squat Pivot Transfers;Lateral/Scoot Transfers;Stand Pivot Transfers    Sit to Stand 3: Mod assist;With upper extremity assist;With armrests;From chair/3-in-1;Other (comment)    Sit to Stand Details (indicate cue type and reason) mod A from wheelchair with RW, min A to supervision if pulling from countertop    Stand to Sit 4: Min assist;With upper extremity assist;With armrests;To chair/3-in-1;Other (comment)    Stand to Sit Details (indicate cue type and reason) Verbal cues for technique;Verbal cues for sequencing;Manual facilitation for weight shifting;Verbal cues for safe use of DME/AE    Squat Pivot Transfers 3: Mod assist;With upper extremity assistance    Squat Pivot Transfer Details (indicate cue  type and reason) wheelchair to nu step and back then wheelchair to leg press and back. Needs mulitimodal cues for technique and sequence      Ambulation/Gait   Ambulation/Gait Yes    Ambulation/Gait Assistance 3: Mod assist;2: Max assist    Ambulation/Gait Assistance Details manual/tactile & verbal cues on upright posture & wt shift.    Ambulation Distance (Feet) 20 Feet   X2   Assistive device Prostheses;Rolling walker    Gait Pattern Step-to pattern;Decreased step length - right;Decreased step length - left;Decreased hip/knee flexion - right;Decreased hip/knee flexion - left;Right foot flat;Left foot flat;Right flexed knee in stance;Left flexed knee in stance;Shuffle;Lateral hip instability;Trunk flexed;Poor foot clearance - left;Poor foot clearance - right    Ambulation Surface Level;Indoor                         [facility] PT Treatment/Exercise - 07/01/19 0001      Therapeutic Activites    Other Therapeutic Activities sit to stands from wheelchair using  bilat UE pull from sink supervision to min A X 4 reps interspaced with standing balance alternate reaching up to top cabinent and reaching forward to soap dispenser      Knee/Hip Exercises: Aerobic   Nustep Level 5 BUEs & BLEs 8 min with verbal & manual cues on full ROM                    PT Short Term Goals - 06/28/19 1349      PT SHORT TERM GOAL #1   Title Patient performs level surface direct contact scooting transfer with bil. prostheses with minA.  (All STGs Target Date: 07/30/2019)    Time 1    Period Months    Status New    Target Date 07/30/19      PT SHORT TERM GOAL #2   Title Patient tolerates prostheses wear daily >90% of awake hours without skin issues.    Time 1    Period Months    Status On-going    Target Date 07/30/19      PT SHORT TERM GOAL #3   Title Stand -pivot transfer with RW between w/c & chair with armrests with modA    Time 1    Period Months    Status On-going    Target  Date 07/30/19      PT SHORT TERM GOAL #4   Title Standing balance with walker support static 2 minutes with supervision and reaches 10" anteriorly with minA    Time 1    Period Months    Status On-going    Target Date 07/30/19      PT SHORT TERM GOAL #5   Title Patient ambulates 20' & turns 90* to position to sit with RW & prostheses with modA.    Time 1    Period Weeks    Status On-going    Target Date 07/30/19             PT Long Term Goals - 06/28/19 1337      PT LONG TERM GOAL #1   Title Patient and family verbalize and demonstrate understanding of proper prosthetic care to enable safe utilization of prostheses. (All LTGs Target Date: 09/24/2019)    Time 3    Period Months    Status On-going    Target Date 09/24/19      PT LONG TERM GOAL #2   Title Patient tolerates wear of bilateral prostheses >90% of awake hours without skin issues or limb pain to enable functional potential during his day.    Time 3    Period Months    Status On-going    Target Date 09/24/19      PT LONG TERM GOAL #3   Title Squat pivot transfer bed to w/c or chair to w/c with prostheses with supervision.    Time 3    Period Months    Status Revised    Target Date 09/24/19      PT LONG TERM GOAL #4   Title Sit to / from stand elevated w/c to walker with prostheses with minA    Time 3    Period Months    Status Revised    Target Date 09/24/19      PT LONG TERM GOAL #5   Title Standing balance with walker support with bilateral prostheses static stance for 2 minutes, scanning environment and reaches 2" anteriorly with supervision.    Time 3    Period Months  Status Revised    Target Date 09/24/19      PT LONG TERM GOAL #6   Title Patient ambulates 25' between 2 chairs including turning 90* to position to sit with RW & prostheses with minA from family safely.    Time 3    Period Months    Status Revised    Target Date 09/24/19                 Plan - 07/01/19 1417     Clinical Impression Statement PT noticed improvements in sit to stands and transfers both lateral scoot and squat pivot in that he required less overall assistance. He also had improvements in turning and step length with ambulation today. PT will continue to progress as able.    Personal Factors and Comorbidities Age;Comorbidity 3+;Fitness;Time since onset of injury/illness/exacerbation    Comorbidities Bil TTAs, DM2, PAD, HTN, CHF, A-Fib, TIA, CKDst3,    Examination-Activity Limitations Locomotion Level;Sit;Stand;Toileting;Transfers    Examination-Participation Restrictions Community Activity    Stability/Clinical Decision Making Evolving/Moderate complexity    Rehab Potential Good    PT Frequency 2x / week    PT Duration 12 weeks   3 months   PT Treatment/Interventions ADLs/Self Care Home Management;DME Instruction;Gait training;Stair training;Functional mobility training;Therapeutic activities;Therapeutic exercise;Balance training;Neuromuscular re-education;Patient/family education;Prosthetic Training;Manual techniques;Passive range of motion;Vestibular    PT Next Visit Plan work towards updated STGs, balance activites seated on 24" stool at sink,  transfers for stand pivot, Nustep, leg press, standing balance & gait with RW when 2nd person available to assist, golf swings seated on mat table with patient in higher position    Consulted and Agree with Plan of Care Patient;Family member/caregiver    Family Member Consulted dtr, [name] & son, Aydn Ferrara           Patient will benefit from skilled therapeutic intervention in order to improve the following deficits and impairments:  Abnormal gait, Cardiopulmonary status limiting activity, Decreased activity tolerance, Decreased balance, Decreased endurance, Decreased knowledge of use of DME, Decreased mobility, Decreased range of motion, Decreased scar mobility, Decreased strength, Increased edema, Impaired flexibility, Impaired UE functional  use, Postural dysfunction, Prosthetic Dependency, Pain  Visit Diagnosis: Unsteadiness on feet  Other abnormalities of gait and mobility  Abnormal posture  Weakness generalized  History of fall  Stiffness of right knee, not elsewhere classified  Stiffness of left knee, not elsewhere classified  Contracture of muscle, multiple sites     Problem List Patient Active Problem List   Diagnosis Date Noted  . Weakness generalized 04/05/2019  . S/P BKA (below knee amputation) unilateral, left ([place]) 02/15/2019  . Pressure injury of skin 02/03/2019  . TIA (transient ischemic attack) 02/03/2019  . Transient speech disturbance 02/02/2019  . Chronic indwelling Foley catheter 01/22/2019  . Postoperative wound infection 01/20/2019  . Normocytic anemia 01/18/2019  . Cellulitis 01/18/2019  . Infection of deep incisional surgical site after procedure   . Abnormal urinalysis   . Labile blood glucose   . Urinary retention   . S/P bilateral BKA (below knee amputation) ([place])   . Acute blood loss anemia   . Urinary retention due to benign prostatic hyperplasia 12/28/2018  . Unilateral complete BKA, left, initial encounter ([place]) 12/25/2018  . Dehiscence of amputation stump ([place])   . History of transmetatarsal amputation of left foot ([place]) 12/09/2018  . Critical limb ischemia with history of revascularization of same extremity   . Gangrene of left foot ([place])   . Diabetic ulcer  of toe of left foot associated with type 2 diabetes mellitus ([place])   . Cellulitis of left lower extremity   . Toe infection 11/18/2018  . Peripheral edema   . Acute on chronic diastolic CHF (congestive heart failure) ([place]) 11/09/2018  . S/P BKA (below knee amputation) unilateral, right ([place]) 10/05/2018  . Hyponatremia   . Essential hypertension   . Postoperative pain   . Pain of left heel   . Unable to maintain weight-bearing   . Type 2 diabetes mellitus with diabetic peripheral angiopathy with gangrene ([place])   .  Anemia due to acute blood loss   . Chronic kidney disease, stage 3a   . Acquired absence of right leg below knee ([place]) 09/07/2018  . Gangrene of right foot ([place])   . DNR (do not resuscitate) discussion   . Goals of care, counseling/discussion   . Palliative care by specialist   . Type 2 diabetes mellitus with vascular disease ([place])   . Severe protein-calorie malnutrition ([place])   . Ischemic foot 08/20/2018  . Critical lower limb ischemia 08/03/2018  . Type 2 diabetes mellitus with foot ulcer, without long-term current use of insulin ([place])   . Gastroesophageal reflux disease   . Cellulitis of great toe of right foot 05/29/2018  . Atrial fibrillation, chronic   . Chest pain 07/25/2017  . PAF (paroxysmal atrial fibrillation) ([place]) 07/25/2017  . BPH (benign prostatic hyperplasia) 07/25/2017  . Shortness of breath 05/11/2017  . Hypothyroidism 05/11/2017  . Chronic diastolic CHF (congestive heart failure) ([place]) 05/11/2017  . Moderate aortic stenosis 06/12/2016  . RBBB 06/12/2016  . Peripheral vascular disease ([place]) 08/04/2012  . Essential hypertension, benign 08/04/2012  . Hyperlipidemia 08/04/2012  . Dizziness 08/04/2012    Silvestre Mesi 07/01/2019, 2:21 PM  [facility] [address] [place], [place], [address] Phone: [phone]   Fax:  [phone]  Name: COCHISE DINNEEN MRN: [id number] Date of Birth: Dec 09, 1927

## 2019-07-05 ENCOUNTER — Ambulatory Visit (INDEPENDENT_AMBULATORY_CARE_PROVIDER_SITE_OTHER): Payer: Medicare Other | Admitting: Physical Therapy

## 2019-07-05 ENCOUNTER — Other Ambulatory Visit: Payer: Self-pay

## 2019-07-05 ENCOUNTER — Encounter: Payer: Self-pay | Admitting: Physical Therapy

## 2019-07-05 DIAGNOSIS — R2689 Other abnormalities of gait and mobility: Secondary | ICD-10-CM

## 2019-07-05 DIAGNOSIS — M25662 Stiffness of left knee, not elsewhere classified: Secondary | ICD-10-CM

## 2019-07-05 DIAGNOSIS — R2681 Unsteadiness on feet: Secondary | ICD-10-CM

## 2019-07-05 DIAGNOSIS — Z9181 History of falling: Secondary | ICD-10-CM | POA: Diagnosis not present

## 2019-07-05 DIAGNOSIS — R531 Weakness: Secondary | ICD-10-CM | POA: Diagnosis not present

## 2019-07-05 DIAGNOSIS — R293 Abnormal posture: Secondary | ICD-10-CM | POA: Diagnosis not present

## 2019-07-05 DIAGNOSIS — M6249 Contracture of muscle, multiple sites: Secondary | ICD-10-CM

## 2019-07-05 DIAGNOSIS — M25661 Stiffness of right knee, not elsewhere classified: Secondary | ICD-10-CM

## 2019-07-05 DIAGNOSIS — R600 Localized edema: Secondary | ICD-10-CM

## 2019-07-05 NOTE — Therapy (Signed)
[facility] [address] [place], [place], [address] Phone: [phone]   Fax:  [phone]  Physical Therapy Treatment  Patient Details  Name: [name] MRN: [id number] Date of Birth: October 17, 1927 Referring Provider (PT): Travis Score, MD   Encounter Date: 07/05/2019   PT End of Session - 07/05/19 1148    Visit Number 27    Number of Visits 81    Date for PT Re-Evaluation 06/30/19    Authorization Type [employer] Medicare    Authorization Time Period $30 co-pay    Progress Note Due on Visit 30    PT Start Time 1145    PT Stop Time 1230    PT Time Calculation (min) 45 min    Equipment Utilized During Treatment Gait belt    Activity Tolerance Patient tolerated treatment well    Behavior During Therapy [facility] for tasks assessed/performed           Past Medical History:  Diagnosis Date  . Arthritis   . Borderline diabetic   . Diabetes mellitus without complication ([place])   . Glaucoma   . Hyperlipidemia   . Hypertension   . PVD (peripheral vascular disease) ([place])   . Sleep apnea    Cpap ordered but doesnt use  . Urinary retention due to benign prostatic hyperplasia 12/28/2018    Past Surgical History:  Procedure Laterality Date  . ABDOMINAL AORTOGRAM W/LOWER EXTREMITY N/A 07/27/2018   Procedure: ABDOMINAL AORTOGRAM W/LOWER EXTREMITY;  Surgeon: Lorretta Harp, MD;  Location: [place] [facility];  Service: Cardiovascular;  Laterality: N/A;  . AMPUTATION Right 08/28/2018   Procedure: RIGHT BELOW KNEE AMPUTATION;  Surgeon: [name], MD;  Location: [place];  Service: Orthopedics;  Laterality: Right;  . AMPUTATION Left 12/02/2018   Procedure: LEFT TRANSMETATARSAL AMPUTATION AND NEGATIVE PRESSURE WOUND VAC PLACEMENT;  Surgeon: [name], MD;  Location: [place];  Service: Orthopedics;  Laterality: Left;  . AMPUTATION Left 12/18/2018   Procedure: LEFT BELOW KNEE AMPUTATION;  Surgeon: [name], MD;  Location: [place];  Service:  Orthopedics;  Laterality: Left;  . APPENDECTOMY  1943  . BELOW KNEE LEG AMPUTATION Left 12/18/2018  . CATARACT EXTRACTION  2012   x2  . COLONOSCOPY N/A 11/01/2014   Procedure: COLONOSCOPY;  Surgeon: Aviva Signs, MD;  [facility];  Service: Gastroenterology;  Laterality: N/A;  . ESOPHAGOGASTRODUODENOSCOPY N/A 11/01/2014   Procedure: ESOPHAGOGASTRODUODENOSCOPY (EGD);  Surgeon: Aviva Signs, MD;  [facility];  Service: Gastroenterology;  Laterality: N/A;  . HERNIA REPAIR Left   . INGUINAL HERNIA REPAIR Right 03/01/2013   Procedure: RIGHT INGUINAL HERNIORRHAPHY;  Surgeon: Jamesetta So, MD;  Location: [facility];  Service: General;  Laterality: Right;  . INSERTION OF MESH Right 03/01/2013   Procedure: INSERTION OF MESH;  Surgeon: Jamesetta So, MD;  [facility];  Service: General;  Laterality: Right;  . LOWER EXTREMITY ANGIOGRAPHY Right 08/25/2018   Procedure: LOWER EXTREMITY ANGIOGRAPHY;  Surgeon: Wellington Hampshire, MD;  Location: [place] [facility];  Service: Cardiovascular;  Laterality: Right;  . LOWER EXTREMITY ANGIOGRAPHY N/A 11/25/2018   Procedure: LOWER EXTREMITY ANGIOGRAPHY;  Surgeon: Wellington Hampshire, MD;  Location: [place] [facility];  Service: Cardiovascular;  Laterality: N/A;  . NM MYOCAR PERF WALL MOTION  06/05/2010   no significant ischemia  . PERIPHERAL VASCULAR ATHERECTOMY Right 08/03/2018   Procedure: PERIPHERAL VASCULAR ATHERECTOMY;  Surgeon: Lorretta Harp, MD;  Location: [place] [facility];  Service:  Cardiovascular;  Laterality: Right;  . PERIPHERAL VASCULAR ATHERECTOMY Left 11/23/2018   Procedure: PERIPHERAL VASCULAR ATHERECTOMY;  Surgeon: Lorretta Harp, MD;  Location: [place] [facility];  Service: Cardiovascular;  Laterality: Left;  sfa with dc balloon  . PERIPHERAL VASCULAR BALLOON ANGIOPLASTY Left 11/25/2018   Procedure: PERIPHERAL VASCULAR BALLOON ANGIOPLASTY;  Surgeon: Wellington Hampshire, MD;  Location: [place] [facility];  Service:  Cardiovascular;  Laterality: Left;  popliteal  . PERIPHERAL VASCULAR INTERVENTION Left 11/25/2018   Procedure: PERIPHERAL VASCULAR INTERVENTION;  Surgeon: Wellington Hampshire, MD;  Location: [place] [facility];  Service: Cardiovascular;  Laterality: Left;  tibial peroneal trunk and peroneal stents   . stent  06/14/2010   left leg  . STUMP REVISION Left 01/20/2019   Procedure: REVISION LEFT BELOW KNEE AMPUTATION;  Surgeon: [name], MD;  Location: [place];  Service: Orthopedics;  Laterality: Left;  . US ECHOCARDIOGRAPHY  01/21/2006   moderate mitral annular ca+, mild MR, AOV moderately sclerotic.    There were no vitals filed for this visit.   Subjective Assessment - 07/05/19 1149    Subjective No issues over the weekend. He has been able to wear liner without shrinker up to 6 hours with slight itching on proximal lateral liner    Patient is accompained by: Family member   son, [name]   Pertinent History Bil TTAs, DM2, PAD, HTN, CHF, A-Fib, TIA, CKDst3    Patient Stated Goals walk with prostheses in home & in/out house. Son would like him to be able to toilet & get in/out bed by himself.    Currently in Pain? No/denies    Pain Onset In the past 7 days                             [facility] Adult PT Treatment/Exercise - 07/05/19 1145      Transfers   Transfers Sit to Stand;Stand to Sit;Squat Pivot Transfers;Lateral/Scoot Transfers;Stand Pivot Transfers    Sit to Stand 3: Mod assist;2: Max assist;With upper extremity assist;With armrests;From chair/3-in-1;Other (comment)   ModA pulling on sink & MaxA to RW   Sit to Stand Details Manual facilitation for weight shifting;Verbal cues for safe use of DME/AE;Verbal cues for technique;Verbal cues for sequencing    Stand to Sit 4: Min assist;With upper extremity assist;With armrests;To chair/3-in-1;Other (comment)   from sink & from RW   Stand to Sit Details (indicate cue type and reason) Verbal cues for technique;Verbal cues for  sequencing;Manual facilitation for weight shifting;Verbal cues for safe use of DME/AE    Squat Pivot Transfers --      Ambulation/Gait   Ambulation/Gait Yes    Ambulation/Gait Assistance 3: Mod assist    Ambulation/Gait Assistance Details manual / tactile & verbal cues on wt shift to stance limb and upright posture.  Brooke Pace, [facility] present for dynamic gait analyis and made changes to both prostheses right extension of socket and left offset anteriorly.  Pt reported that prostheses felt better.      Ambulation Distance (Feet) 20 Feet   X2   Assistive device Prostheses;Rolling walker    Gait Pattern Step-to pattern;Decreased step length - right;Decreased step length - left;Decreased hip/knee flexion - right;Decreased hip/knee flexion - left;Right foot flat;Left foot flat;Right flexed knee in stance;Left flexed knee in stance;Shuffle;Lateral hip instability;Trunk flexed;Poor foot clearance - left;Poor foot clearance - right    Ambulation Surface Level;Indoor      Therapeutic Activites  Other Therapeutic Activities sitting on 24" bar stool with feet supported on floor - trunk motions without UE assist with min guard - 1) forward flexion to upright  2) back lean to upright  3) rotation right & left and 4) side bend right & left reaching to chair.  5 reps each.       Knee/Hip Exercises: Aerobic   Nustep --      Prosthetics   Prosthetic Care Comments  prosthetist made changes to bilateral prostheses.  PT issued 4" stockinette to use under proximal liners to decrease friction / absorb sweat.  Pt & dtr verbalized understanding.     Current prosthetic wear tolerance (days/week)  daily    Current prosthetic wear tolerance (#hours/day)  most of awake hours - up to 6hrs without shrinker under liners.     Edema pitting edema    Residual limb condition  no open areas, normal color & temperature,     Education Provided Skin check;Residual limb care;Correct ply sock adjustment;Proper Donning;Proper wear  schedule/adjustment;Other (comment)   see prosthetic care comments   Person(s) Educated Patient;Child(ren)    Education Method Explanation;Demonstration;Tactile cues;Verbal cues    Education Method Verbalized understanding;Verbal cues required;Needs further instruction                  PT Education - 07/05/19 1230    Education Details sitting on 24" bar stool near sink trunk motion 6 directions    Person(s) Educated Patient;Child(ren)    Methods Explanation;Demonstration;Verbal cues;Tactile cues    Comprehension Verbalized understanding;Need further instruction            PT Short Term Goals - 07/05/19 2047      PT SHORT TERM GOAL #1   Title Patient performs level surface direct contact scooting transfer with bil. prostheses with minA.  (All STGs Target Date: 07/30/2019)    Time 1    Period Months    Status On-going    Target Date 07/30/19      PT SHORT TERM GOAL #2   Title Patient tolerates prostheses wear daily >90% of awake hours without skin issues.    Time 1    Period Months    Status On-going    Target Date 07/30/19      PT SHORT TERM GOAL #3   Title Stand -pivot transfer with RW between w/c & chair with armrests with modA    Time 1    Period Months    Status On-going    Target Date 07/30/19      PT SHORT TERM GOAL #4   Title Standing balance with walker support static 2 minutes with supervision and reaches 10" anteriorly with minA    Time 1    Period Months    Status On-going    Target Date 07/30/19      PT SHORT TERM GOAL #5   Title Patient ambulates 20' & turns 90* to position to sit with RW & prostheses with modA.    Time 1    Period Weeks    Status On-going    Target Date 07/30/19             PT Long Term Goals - 07/05/19 2047      PT LONG TERM GOAL #1   Title Patient and family verbalize and demonstrate understanding of proper prosthetic care to enable safe utilization of prostheses. (All LTGs Target Date: 09/24/2019)    Time 3     Period Months    Status  On-going    Target Date 09/24/19      PT LONG TERM GOAL #2   Title Patient tolerates wear of bilateral prostheses >90% of awake hours without skin issues or limb pain to enable functional potential during his day.    Time 3    Period Months    Status On-going    Target Date 09/24/19      PT LONG TERM GOAL #3   Title Squat pivot transfer bed to w/c or chair to w/c with prostheses with supervision.    Time 3    Period Months    Status On-going    Target Date 09/24/19      PT LONG TERM GOAL #4   Title Sit to / from stand elevated w/c to walker with prostheses with minA    Time 3    Period Months    Status On-going    Target Date 09/24/19      PT LONG TERM GOAL #5   Title Standing balance with walker support with bilateral prostheses static stance for 2 minutes, scanning environment and reaches 2" anteriorly with supervision.    Time 3    Period Months    Status On-going    Target Date 09/24/19      PT LONG TERM GOAL #6   Title Patient ambulates 25' between 2 chairs including turning 90* to position to sit with RW & prostheses with minA from family safely.    Time 3    Period Months    Status On-going    Target Date 09/24/19                 Plan - 07/05/19 1149    Clinical Impression Statement Prosthetist present today with alignment changes which improved his gait.  PT added trunk exercises sitting on 24" stool with daughter's assist.    Personal Factors and Comorbidities Age;Comorbidity 3+;Fitness;Time since onset of injury/illness/exacerbation    Comorbidities Bil TTAs, DM2, PAD, HTN, CHF, A-Fib, TIA, CKDst3,    Examination-Activity Limitations Locomotion Level;Sit;Stand;Toileting;Transfers    Examination-Participation Restrictions Community Activity    Stability/Clinical Decision Making Evolving/Moderate complexity    Rehab Potential Good    PT Frequency 2x / week    PT Duration 12 weeks   3 months   PT Treatment/Interventions  ADLs/Self Care Home Management;DME Instruction;Gait training;Stair training;Functional mobility training;Therapeutic activities;Therapeutic exercise;Balance training;Neuromuscular re-education;Patient/family education;Prosthetic Training;Manual techniques;Passive range of motion;Vestibular    PT Next Visit Plan work towards updated STGs, balance activites seated on 24" stool at sink,  transfers for stand pivot, Nustep, leg press, standing balance & gait with RW when 2nd person available to assist, golf swings seated on mat table with patient in higher position    Consulted and Agree with Plan of Care Patient;Family member/caregiver    Family Member Consulted dtr, [name]           Patient will benefit from skilled therapeutic intervention in order to improve the following deficits and impairments:  Abnormal gait, Cardiopulmonary status limiting activity, Decreased activity tolerance, Decreased balance, Decreased endurance, Decreased knowledge of use of DME, Decreased mobility, Decreased range of motion, Decreased scar mobility, Decreased strength, Increased edema, Impaired flexibility, Impaired UE functional use, Postural dysfunction, Prosthetic Dependency, Pain  Visit Diagnosis: Other abnormalities of gait and mobility  Unsteadiness on feet  Abnormal posture  Weakness generalized  History of fall  Stiffness of right knee, not elsewhere classified  Stiffness of left knee, not elsewhere classified  Contracture of muscle, multiple  sites  Localized edema     Problem List Patient Active Problem List   Diagnosis Date Noted  . Weakness generalized 04/05/2019  . S/P BKA (below knee amputation) unilateral, left ([place]) 02/15/2019  . Pressure injury of skin 02/03/2019  . TIA (transient ischemic attack) 02/03/2019  . Transient speech disturbance 02/02/2019  . Chronic indwelling Foley catheter 01/22/2019  . Postoperative wound infection 01/20/2019  . Normocytic anemia 01/18/2019  .  Cellulitis 01/18/2019  . Infection of deep incisional surgical site after procedure   . Abnormal urinalysis   . Labile blood glucose   . Urinary retention   . S/P bilateral BKA (below knee amputation) ([place])   . Acute blood loss anemia   . Urinary retention due to benign prostatic hyperplasia 12/28/2018  . Unilateral complete BKA, left, initial encounter ([place]) 12/25/2018  . Dehiscence of amputation stump ([place])   . History of transmetatarsal amputation of left foot ([place]) 12/09/2018  . Critical limb ischemia with history of revascularization of same extremity   . Gangrene of left foot ([place])   . Diabetic ulcer of toe of left foot associated with type 2 diabetes mellitus ([place])   . Cellulitis of left lower extremity   . Toe infection 11/18/2018  . Peripheral edema   . Acute on chronic diastolic CHF (congestive heart failure) ([place]) 11/09/2018  . S/P BKA (below knee amputation) unilateral, right ([place]) 10/05/2018  . Hyponatremia   . Essential hypertension   . Postoperative pain   . Pain of left heel   . Unable to maintain weight-bearing   . Type 2 diabetes mellitus with diabetic peripheral angiopathy with gangrene ([place])   . Anemia due to acute blood loss   . Chronic kidney disease, stage 3a   . Acquired absence of right leg below knee ([place]) 09/07/2018  . Gangrene of right foot ([place])   . DNR (do not resuscitate) discussion   . Goals of care, counseling/discussion   . Palliative care by specialist   . Type 2 diabetes mellitus with vascular disease ([place])   . Severe protein-calorie malnutrition ([place])   . Ischemic foot 08/20/2018  . Critical lower limb ischemia 08/03/2018  . Type 2 diabetes mellitus with foot ulcer, without long-term current use of insulin ([place])   . Gastroesophageal reflux disease   . Cellulitis of great toe of right foot 05/29/2018  . Atrial fibrillation, chronic   . Chest pain 07/25/2017  . PAF (paroxysmal atrial fibrillation) ([place]) 07/25/2017  . BPH (benign prostatic  hyperplasia) 07/25/2017  . Shortness of breath 05/11/2017  . Hypothyroidism 05/11/2017  . Chronic diastolic CHF (congestive heart failure) ([place]) 05/11/2017  . Moderate aortic stenosis 06/12/2016  . RBBB 06/12/2016  . Peripheral vascular disease ([place]) 08/04/2012  . Essential hypertension, benign 08/04/2012  . Hyperlipidemia 08/04/2012  . Dizziness 08/04/2012    Jamey Reas PT, DPT 07/05/2019, 8:52 PM  [facility] [address] [place], [place], [address] Phone: [phone]   Fax:  [phone]  Name: [name] MRN: [id number] Date of Birth: 1927/09/13

## 2019-07-08 ENCOUNTER — Encounter: Payer: Self-pay | Admitting: Physical Therapy

## 2019-07-08 ENCOUNTER — Ambulatory Visit (INDEPENDENT_AMBULATORY_CARE_PROVIDER_SITE_OTHER): Payer: Medicare Other | Admitting: Physical Therapy

## 2019-07-08 ENCOUNTER — Other Ambulatory Visit: Payer: Self-pay

## 2019-07-08 DIAGNOSIS — R2681 Unsteadiness on feet: Secondary | ICD-10-CM | POA: Diagnosis not present

## 2019-07-08 DIAGNOSIS — M6249 Contracture of muscle, multiple sites: Secondary | ICD-10-CM

## 2019-07-08 DIAGNOSIS — Z9181 History of falling: Secondary | ICD-10-CM | POA: Diagnosis not present

## 2019-07-08 DIAGNOSIS — M25661 Stiffness of right knee, not elsewhere classified: Secondary | ICD-10-CM

## 2019-07-08 DIAGNOSIS — R600 Localized edema: Secondary | ICD-10-CM

## 2019-07-08 DIAGNOSIS — M25662 Stiffness of left knee, not elsewhere classified: Secondary | ICD-10-CM

## 2019-07-08 DIAGNOSIS — R2689 Other abnormalities of gait and mobility: Secondary | ICD-10-CM | POA: Diagnosis not present

## 2019-07-08 DIAGNOSIS — R293 Abnormal posture: Secondary | ICD-10-CM

## 2019-07-08 DIAGNOSIS — R531 Weakness: Secondary | ICD-10-CM

## 2019-07-08 NOTE — Therapy (Signed)
[facility] Physical Therapy [address] [place], [place], [address] Phone: [phone]   Fax:  [phone]  Physical Therapy Treatment  Patient Details  Name: [name] MRN: [id number] Date of Birth: May 22, 1927 Referring Provider (PT): [name], MD   Encounter Date: 07/08/2019   PT End of Session - 07/08/19 1057    Visit Number 28    Number of Visits 69    Date for PT Re-Evaluation 06/30/19    Authorization Type [employer] Medicare    Authorization Time Period $30 co-pay    Progress Note Due on Visit 30    PT Start Time 1100    PT Stop Time 1145    PT Time Calculation (min) 45 min    Equipment Utilized During Treatment Gait belt    Activity Tolerance Patient tolerated treatment well    Behavior During Therapy WFL for tasks assessed/performed           Past Medical History:  Diagnosis Date  . Arthritis   . Borderline diabetic   . Diabetes mellitus without complication ([place])   . Glaucoma   . Hyperlipidemia   . Hypertension   . PVD (peripheral vascular disease) ([place])   . Sleep apnea    Cpap ordered but doesnt use  . Urinary retention due to benign prostatic hyperplasia 12/28/2018    Past Surgical History:  Procedure Laterality Date  . ABDOMINAL AORTOGRAM W/LOWER EXTREMITY N/A 07/27/2018   Procedure: ABDOMINAL AORTOGRAM W/LOWER EXTREMITY;  Surgeon: Lorretta Harp, MD;  Location: [place] [facility];  Service: Cardiovascular;  Laterality: N/A;  . AMPUTATION Right 08/28/2018   Procedure: RIGHT BELOW KNEE AMPUTATION;  Surgeon: [name], MD;  Location: [place];  Service: Orthopedics;  Laterality: Right;  . AMPUTATION Left 12/02/2018   Procedure: LEFT TRANSMETATARSAL AMPUTATION AND NEGATIVE PRESSURE WOUND VAC PLACEMENT;  Surgeon: [name], MD;  Location: [place];  Service: Orthopedics;  Laterality: Left;  . AMPUTATION Left 12/18/2018   Procedure: LEFT BELOW KNEE AMPUTATION;  Surgeon: [name], MD;  Location: [place];  Service:  Orthopedics;  Laterality: Left;  . APPENDECTOMY  1943  . BELOW KNEE LEG AMPUTATION Left 12/18/2018  . CATARACT EXTRACTION  2012   x2  . COLONOSCOPY N/A 11/01/2014   Procedure: COLONOSCOPY;  Surgeon: Aviva Signs, MD;  [facility];  Service: Gastroenterology;  Laterality: N/A;  . ESOPHAGOGASTRODUODENOSCOPY N/A 11/01/2014   Procedure: ESOPHAGOGASTRODUODENOSCOPY (EGD);  Surgeon: Aviva Signs, MD;  [facility];  Service: Gastroenterology;  Laterality: N/A;  . HERNIA REPAIR Left   . INGUINAL HERNIA REPAIR Right 03/01/2013   Procedure: RIGHT INGUINAL HERNIORRHAPHY;  Surgeon: Jamesetta So, MD;  Location: [facility];  Service: General;  Laterality: Right;  . INSERTION OF MESH Right 03/01/2013   Procedure: INSERTION OF MESH;  Surgeon: Jamesetta So, MD;  Location: [facility];  Service: General;  Laterality: Right;  . LOWER EXTREMITY ANGIOGRAPHY Right 08/25/2018   Procedure: LOWER EXTREMITY ANGIOGRAPHY;  Surgeon: Wellington Hampshire, MD;  Location: [place] [facility];  Service: Cardiovascular;  Laterality: Right;  . LOWER EXTREMITY ANGIOGRAPHY N/A 11/25/2018   Procedure: LOWER EXTREMITY ANGIOGRAPHY;  Surgeon: Wellington Hampshire, MD;  Location: [place] [facility];  Service: Cardiovascular;  Laterality: N/A;  . NM MYOCAR PERF WALL MOTION  06/05/2010   no significant ischemia  . PERIPHERAL VASCULAR ATHERECTOMY Right 08/03/2018   Procedure: PERIPHERAL VASCULAR ATHERECTOMY;  Surgeon: Lorretta Harp, MD;  Location: [place] [facility];  Service:  Cardiovascular;  Laterality: Right;  . PERIPHERAL VASCULAR ATHERECTOMY Left 11/23/2018   Procedure: PERIPHERAL VASCULAR ATHERECTOMY;  Surgeon: Lorretta Harp, MD;  Location: [place] [facility];  Service: Cardiovascular;  Laterality: Left;  sfa with dc balloon  . PERIPHERAL VASCULAR BALLOON ANGIOPLASTY Left 11/25/2018   Procedure: PERIPHERAL VASCULAR BALLOON ANGIOPLASTY;  Surgeon: Wellington Hampshire, MD;  Location: [place] [facility];  Service:  Cardiovascular;  Laterality: Left;  popliteal  . PERIPHERAL VASCULAR INTERVENTION Left 11/25/2018   Procedure: PERIPHERAL VASCULAR INTERVENTION;  Surgeon: Wellington Hampshire, MD;  Location: [place] [facility];  Service: Cardiovascular;  Laterality: Left;  tibial peroneal trunk and peroneal stents   . stent  06/14/2010   left leg  . STUMP REVISION Left 01/20/2019   Procedure: REVISION LEFT BELOW KNEE AMPUTATION;  Surgeon: [name], MD;  Location: [place];  Service: Orthopedics;  Laterality: Left;  . US ECHOCARDIOGRAPHY  01/21/2006   moderate mitral annular ca+, mild MR, AOV moderately sclerotic.    There were no vitals filed for this visit.   Subjective Assessment - 07/08/19 1057    Subjective He has been doing trunk exercises on 24" stool with his daughter. He has been wearing prostheses daily for most of awake hours without shrinker under liner with stockinette under proximal liner above knee.  He had minor itching one day but no other issues.    Patient is accompained by: Family member   son, [name]   Pertinent History Bil TTAs, DM2, PAD, HTN, CHF, A-Fib, TIA, CKDst3    Patient Stated Goals walk with prostheses in home & in/out house. Son would like him to be able to toilet & get in/out bed by himself.    Currently in Pain? No/denies    Pain Onset In the past 7 days                             [facility] Adult PT Treatment/Exercise - 07/08/19 1100      Transfers   Transfers Sit to Stand;Stand to Sit;Squat Pivot Transfers;Lateral/Scoot Transfers;Stand Pivot Transfers    Sit to Stand 3: Mod assist;2: Max assist;With upper extremity assist;With armrests;From chair/3-in-1;Other (comment);4: Min assist   ModA initially to MinA pulling on sink & MaxA to RW   Sit to Stand Details Manual facilitation for weight shifting;Verbal cues for safe use of DME/AE;Verbal cues for technique;Verbal cues for sequencing    Sit to Stand Details (indicate cue type and reason) broke down  components: 1) rocking forward for momentum coming up on count of 3  2)push forward to get weight over feet  3)reaching with LUE (trialed both UEs & better reaching with LUE first)  4)reaching with 2nd UE  5) righting trunk    Progressed from standing at sink to standing to RW    Stand to Sit 4: Min assist;With upper extremity assist;With armrests;To chair/3-in-1;Other (comment)   from sink & from RW   Stand to Sit Details (indicate cue type and reason) Verbal cues for technique;Verbal cues for sequencing;Manual facilitation for weight shifting;Verbal cues for safe use of DME/AE    Stand to Sit Details reaching with LUE first      Ambulation/Gait   Ambulation/Gait Yes    Ambulation/Gait Assistance 3: Mod assist;4: Min assist   modA initially/turns to minA once in motion on straight path   Ambulation/Gait Assistance Details manual / tactile & verbal cues on wt shift to stance limb and  upright posture.    Ambulation Distance (Feet) 20 Feet   X2 plus 90* turn to position to sit   Assistive device Prostheses;Rolling walker    Gait Pattern Step-to pattern;Decreased step length - right;Decreased step length - left;Decreased hip/knee flexion - right;Decreased hip/knee flexion - left;Right foot flat;Left foot flat;Right flexed knee in stance;Left flexed knee in stance;Shuffle;Lateral hip instability;Trunk flexed;Poor foot clearance - left;Poor foot clearance - right    Ambulation Surface Level;Indoor      Therapeutic Activites    Other Therapeutic Activities sitting on 24" bar stool with feet supported on floor - trunk motions without UE assist with min guard - 1) forward flexion to upright  2) back lean to upright  3) rotation right & left and 4) side bend right & left reaching to chair.  5 reps each.       Neuro Re-ed    Neuro Re-ed Details  standing at sink with BUE support: weight shift right/left with pelvis, weight shift forward with pelvis then using UEs to push trunk upright 3 reps 5 second hold.        Prosthetics   Prosthetic Care Comments  --    Current prosthetic wear tolerance (days/week)  daily    Current prosthetic wear tolerance (#hours/day)  most of awake hours - up to 6hrs without shrinker under liners.     Edema pitting edema    Residual limb condition  no open areas, normal color & temperature,     Education Provided Skin check;Residual limb care;Correct ply sock adjustment;Proper Donning;Proper wear schedule/adjustment;Other (comment)   see prosthetic care comments   Person(s) Educated Patient;Child(ren)    Education Method Explanation;Demonstration;Tactile cues;Verbal cues    Education Method Verbalized understanding;Verbal cues required;Needs further instruction                    PT Short Term Goals - 07/05/19 2047      PT SHORT TERM GOAL #1   Title Patient performs level surface direct contact scooting transfer with bil. prostheses with minA.  (All STGs Target Date: 07/30/2019)    Time 1    Period Months    Status On-going    Target Date 07/30/19      PT SHORT TERM GOAL #2   Title Patient tolerates prostheses wear daily >90% of awake hours without skin issues.    Time 1    Period Months    Status On-going    Target Date 07/30/19      PT SHORT TERM GOAL #3   Title Stand -pivot transfer with RW between w/c & chair with armrests with modA    Time 1    Period Months    Status On-going    Target Date 07/30/19      PT SHORT TERM GOAL #4   Title Standing balance with walker support static 2 minutes with supervision and reaches 10" anteriorly with minA    Time 1    Period Months    Status On-going    Target Date 07/30/19      PT SHORT TERM GOAL #5   Title Patient ambulates 20' & turns 90* to position to sit with RW & prostheses with modA.    Time 1    Period Weeks    Status On-going    Target Date 07/30/19             PT Long Term Goals - 07/05/19 2047      PT LONG TERM GOAL #1  Title Patient and family verbalize and demonstrate  understanding of proper prosthetic care to enable safe utilization of prostheses. (All LTGs Target Date: 09/24/2019)    Time 3    Period Months    Status On-going    Target Date 09/24/19      PT LONG TERM GOAL #2   Title Patient tolerates wear of bilateral prostheses >90% of awake hours without skin issues or limb pain to enable functional potential during his day.    Time 3    Period Months    Status On-going    Target Date 09/24/19      PT LONG TERM GOAL #3   Title Squat pivot transfer bed to w/c or chair to w/c with prostheses with supervision.    Time 3    Period Months    Status On-going    Target Date 09/24/19      PT LONG TERM GOAL #4   Title Sit to / from stand elevated w/c to walker with prostheses with minA    Time 3    Period Months    Status On-going    Target Date 09/24/19      PT LONG TERM GOAL #5   Title Standing balance with walker support with bilateral prostheses static stance for 2 minutes, scanning environment and reaches 2" anteriorly with supervision.    Time 3    Period Months    Status On-going    Target Date 09/24/19      PT LONG TERM GOAL #6   Title Patient ambulates 25' between 2 chairs including turning 90* to position to sit with RW & prostheses with minA from family safely.    Time 3    Period Months    Status On-going    Target Date 09/24/19                 Plan - 07/08/19 1057    Clinical Impression Statement PT worked on sit to stand transfers breaking down each component at sink. PT also worked on trunk control sitting on 24" bar stool with bilateral prosthetic feet supported on floor & no UE assist.  Patient's prosthetic gait required less assistance on straight path today.    Personal Factors and Comorbidities Age;Comorbidity 3+;Fitness;Time since onset of injury/illness/exacerbation    Comorbidities Bil TTAs, DM2, PAD, HTN, CHF, A-Fib, TIA, CKDst3,    Examination-Activity Limitations Locomotion  Level;Sit;Stand;Toileting;Transfers    Examination-Participation Restrictions Community Activity    Stability/Clinical Decision Making Evolving/Moderate complexity    Rehab Potential Good    PT Frequency 2x / week    PT Duration 12 weeks   3 months   PT Treatment/Interventions ADLs/Self Care Home Management;DME Instruction;Gait training;Stair training;Functional mobility training;Therapeutic activities;Therapeutic exercise;Balance training;Neuromuscular re-education;Patient/family education;Prosthetic Training;Manual techniques;Passive range of motion;Vestibular    PT Next Visit Plan work towards updated STGs, balance activites seated on 24" stool at sink,  transfers for stand pivot, Nustep, leg press, standing balance & gait with RW when 2nd person available to assist, golf swings seated on mat table with patient in higher position    Consulted and Agree with Plan of Care Patient;Family member/caregiver    Family Member Consulted dtr, [name]           Patient will benefit from skilled therapeutic intervention in order to improve the following deficits and impairments:  Abnormal gait, Cardiopulmonary status limiting activity, Decreased activity tolerance, Decreased balance, Decreased endurance, Decreased knowledge of use of DME, Decreased mobility, Decreased range of motion,  Decreased scar mobility, Decreased strength, Increased edema, Impaired flexibility, Impaired UE functional use, Postural dysfunction, Prosthetic Dependency, Pain  Visit Diagnosis: Unsteadiness on feet  Other abnormalities of gait and mobility  Abnormal posture  Weakness generalized  History of fall  Stiffness of right knee, not elsewhere classified  Stiffness of left knee, not elsewhere classified  Contracture of muscle, multiple sites  Localized edema     Problem List Patient Active Problem List   Diagnosis Date Noted  . Weakness generalized 04/05/2019  . S/P BKA (below knee amputation) unilateral,  left ([place]) 02/15/2019  . Pressure injury of skin 02/03/2019  . TIA (transient ischemic attack) 02/03/2019  . Transient speech disturbance 02/02/2019  . Chronic indwelling Foley catheter 01/22/2019  . Postoperative wound infection 01/20/2019  . Normocytic anemia 01/18/2019  . Cellulitis 01/18/2019  . Infection of deep incisional surgical site after procedure   . Abnormal urinalysis   . Labile blood glucose   . Urinary retention   . S/P bilateral BKA (below knee amputation) ([place])   . Acute blood loss anemia   . Urinary retention due to benign prostatic hyperplasia 12/28/2018  . Unilateral complete BKA, left, initial encounter ([place]) 12/25/2018  . Dehiscence of amputation stump ([place])   . History of transmetatarsal amputation of left foot ([place]) 12/09/2018  . Critical limb ischemia with history of revascularization of same extremity   . Gangrene of left foot ([place])   . Diabetic ulcer of toe of left foot associated with type 2 diabetes mellitus ([place])   . Cellulitis of left lower extremity   . Toe infection 11/18/2018  . Peripheral edema   . Acute on chronic diastolic CHF (congestive heart failure) ([place]) 11/09/2018  . S/P BKA (below knee amputation) unilateral, right ([place]) 10/05/2018  . Hyponatremia   . Essential hypertension   . Postoperative pain   . Pain of left heel   . Unable to maintain weight-bearing   . Type 2 diabetes mellitus with diabetic peripheral angiopathy with gangrene ([place])   . Anemia due to acute blood loss   . Chronic kidney disease, stage 3a   . Acquired absence of right leg below knee ([place]) 09/07/2018  . Gangrene of right foot ([place])   . DNR (do not resuscitate) discussion   . Goals of care, counseling/discussion   . Palliative care by specialist   . Type 2 diabetes mellitus with vascular disease ([place])   . Severe protein-calorie malnutrition ([place])   . Ischemic foot 08/20/2018  . Critical lower limb ischemia 08/03/2018  . Type 2 diabetes mellitus with foot ulcer,  without long-term current use of insulin ([place])   . Gastroesophageal reflux disease   . Cellulitis of great toe of right foot 05/29/2018  . Atrial fibrillation, chronic   . Chest pain 07/25/2017  . PAF (paroxysmal atrial fibrillation) ([place]) 07/25/2017  . BPH (benign prostatic hyperplasia) 07/25/2017  . Shortness of breath 05/11/2017  . Hypothyroidism 05/11/2017  . Chronic diastolic CHF (congestive heart failure) ([place]) 05/11/2017  . Moderate aortic stenosis 06/12/2016  . RBBB 06/12/2016  . Peripheral vascular disease ([place]) 08/04/2012  . Essential hypertension, benign 08/04/2012  . Hyperlipidemia 08/04/2012  . Dizziness 08/04/2012    Jamey Reas PT, DPT 07/08/2019, 6:25 PM  [facility] [address] [place], [place], [address] Phone: [phone]   Fax:  [phone]  Name: [name] MRN: [id number] Date of Birth: 1927-01-15

## 2019-07-12 ENCOUNTER — Other Ambulatory Visit: Payer: Self-pay

## 2019-07-12 ENCOUNTER — Ambulatory Visit (INDEPENDENT_AMBULATORY_CARE_PROVIDER_SITE_OTHER): Payer: Medicare Other | Admitting: Physical Therapy

## 2019-07-12 ENCOUNTER — Encounter: Payer: Self-pay | Admitting: Physical Therapy

## 2019-07-12 DIAGNOSIS — M25661 Stiffness of right knee, not elsewhere classified: Secondary | ICD-10-CM

## 2019-07-12 DIAGNOSIS — R2689 Other abnormalities of gait and mobility: Secondary | ICD-10-CM | POA: Diagnosis not present

## 2019-07-12 DIAGNOSIS — R293 Abnormal posture: Secondary | ICD-10-CM | POA: Diagnosis not present

## 2019-07-12 DIAGNOSIS — R531 Weakness: Secondary | ICD-10-CM

## 2019-07-12 DIAGNOSIS — M6249 Contracture of muscle, multiple sites: Secondary | ICD-10-CM

## 2019-07-12 DIAGNOSIS — M25662 Stiffness of left knee, not elsewhere classified: Secondary | ICD-10-CM

## 2019-07-12 DIAGNOSIS — R2681 Unsteadiness on feet: Secondary | ICD-10-CM

## 2019-07-12 DIAGNOSIS — Z9181 History of falling: Secondary | ICD-10-CM | POA: Diagnosis not present

## 2019-07-12 NOTE — Therapy (Signed)
[facility] Physical Therapy [address] [place], [place], [address] Phone: [phone]   Fax:  [phone]  Physical Therapy Treatment  Patient Details  Name: [name] MRN: [id number] Date of Birth: [birth year] Referring Provider (PT): [name], MD   Encounter Date: 07/12/2019   PT End of Session - 07/12/19 1251    Visit Number 29    Number of Visits 35    Date for PT Re-Evaluation 06/30/19    Authorization Type [employer] Medicare    Authorization Time Period $30 co-pay    Progress Note Due on Visit 30    PT Start Time 1145    PT Stop Time 1234    PT Time Calculation (min) 49 min    Equipment Utilized During Treatment Gait belt    Activity Tolerance Patient tolerated treatment well    Behavior During Therapy WFL for tasks assessed/performed           Past Medical History:  Diagnosis Date  . Arthritis   . Borderline diabetic   . Diabetes mellitus without complication ([place])   . Glaucoma   . Hyperlipidemia   . Hypertension   . PVD (peripheral vascular disease) ([place])   . Sleep apnea    Cpap ordered but doesnt use  . Urinary retention due to benign prostatic hyperplasia 12/28/2018    Past Surgical History:  Procedure Laterality Date  . ABDOMINAL AORTOGRAM W/LOWER EXTREMITY N/A 07/27/2018   Procedure: ABDOMINAL AORTOGRAM W/LOWER EXTREMITY;  Surgeon: Lorretta Harp, MD;  Location: [place] [facility];  Service: Cardiovascular;  Laterality: N/A;  . AMPUTATION Right 08/28/2018   Procedure: RIGHT BELOW KNEE AMPUTATION;  Surgeon: [name], MD;  Location: [place];  Service: Orthopedics;  Laterality: Right;  . AMPUTATION Left 12/02/2018   Procedure: LEFT TRANSMETATARSAL AMPUTATION AND NEGATIVE PRESSURE WOUND VAC PLACEMENT;  Surgeon: [name], MD;  Location: [place];  Service: Orthopedics;  Laterality: Left;  . AMPUTATION Left 12/18/2018   Procedure: LEFT BELOW KNEE AMPUTATION;  Surgeon: [name], MD;  Location: [place];  Service:  Orthopedics;  Laterality: Left;  . APPENDECTOMY  1943  . BELOW KNEE LEG AMPUTATION Left 12/18/2018  . CATARACT EXTRACTION  2012   x2  . COLONOSCOPY N/A 11/01/2014   Procedure: COLONOSCOPY;  Surgeon: Aviva Signs, MD;  [facility];  Service: Gastroenterology;  Laterality: N/A;  . ESOPHAGOGASTRODUODENOSCOPY N/A 11/01/2014   Procedure: ESOPHAGOGASTRODUODENOSCOPY (EGD);  Surgeon: Aviva Signs, MD;  [facility];  Service: Gastroenterology;  Laterality: N/A;  . HERNIA REPAIR Left   . INGUINAL HERNIA REPAIR Right 03/01/2013   Procedure: RIGHT INGUINAL HERNIORRHAPHY;  Surgeon: Jamesetta So, MD;  Location: [facility];  Service: General;  Laterality: Right;  . INSERTION OF MESH Right 03/01/2013   Procedure: INSERTION OF MESH;  Surgeon: Jamesetta So, MD;  [facility];  Service: General;  Laterality: Right;  . LOWER EXTREMITY ANGIOGRAPHY Right 08/25/2018   Procedure: LOWER EXTREMITY ANGIOGRAPHY;  Surgeon: Wellington Hampshire, MD;  Location: [place] [facility];  Service: Cardiovascular;  Laterality: Right;  . LOWER EXTREMITY ANGIOGRAPHY N/A 11/25/2018   Procedure: LOWER EXTREMITY ANGIOGRAPHY;  Surgeon: Wellington Hampshire, MD;  Location: [place] [facility];  Service: Cardiovascular;  Laterality: N/A;  . NM MYOCAR PERF WALL MOTION  06/05/2010   no significant ischemia  . PERIPHERAL VASCULAR ATHERECTOMY Right 08/03/2018   Procedure: PERIPHERAL VASCULAR ATHERECTOMY;  Surgeon: Lorretta Harp, MD;  Location: [place] [facility];  Service:  Cardiovascular;  Laterality: Right;  . PERIPHERAL VASCULAR ATHERECTOMY Left 11/23/2018   Procedure: PERIPHERAL VASCULAR ATHERECTOMY;  Surgeon: Lorretta Harp, MD;  Location: [place] [facility];  Service: Cardiovascular;  Laterality: Left;  sfa with dc balloon  . PERIPHERAL VASCULAR BALLOON ANGIOPLASTY Left 11/25/2018   Procedure: PERIPHERAL VASCULAR BALLOON ANGIOPLASTY;  Surgeon: Wellington Hampshire, MD;  Location: [place] [facility];  Service:  Cardiovascular;  Laterality: Left;  popliteal  . PERIPHERAL VASCULAR INTERVENTION Left 11/25/2018   Procedure: PERIPHERAL VASCULAR INTERVENTION;  Surgeon: Wellington Hampshire, MD;  Location: [place] [facility];  Service: Cardiovascular;  Laterality: Left;  tibial peroneal trunk and peroneal stents   . stent  06/14/2010   left leg  . STUMP REVISION Left 01/20/2019   Procedure: REVISION LEFT BELOW KNEE AMPUTATION;  Surgeon: [name], MD;  Location: [place];  Service: Orthopedics;  Laterality: Left;  . US ECHOCARDIOGRAPHY  01/21/2006   moderate mitral annular ca+, mild MR, AOV moderately sclerotic.    There were no vitals filed for this visit.   Subjective Assessment - 07/12/19 1147    Subjective He has been doing his exercises on bar stool with his children.    Patient is accompained by: Family member   son, [name]   Pertinent History Bil TTAs, DM2, PAD, HTN, CHF, A-Fib, TIA, CKDst3    Patient Stated Goals walk with prostheses in home & in/out house. Son would like him to be able to toilet & get in/out bed by himself.    Currently in Pain? No/denies    Pain Onset In the past 7 days                             [facility] Adult PT Treatment/Exercise - 07/12/19 1145      Transfers   Transfers Sit to Stand;Stand to Sit;Squat Pivot Transfers;Lateral/Scoot Transfers;Stand Pivot Transfers    Sit to Stand 3: Mod assist;2: Max assist;With upper extremity assist;With armrests;From chair/3-in-1;Other (comment);4: Min assist   ModA initially to MinA pulling on sink & MaxA to RW   Sit to Stand Details Manual facilitation for weight shifting;Verbal cues for safe use of DME/AE;Verbal cues for technique;Verbal cues for sequencing    Sit to Stand Details (indicate cue type and reason) broke down components: 1) rocking forward for momentum coming up on count of 3  2)push forward to get weight over feet  3)reaching with LUE (trialed both UEs & better reaching with LUE first)  4)reaching with  2nd UE  5) righting trunk    Progressed from standing at sink to standing to RW    Stand to Sit 4: Min assist;With upper extremity assist;With armrests;To chair/3-in-1;Other (comment)   from sink & from RW   Stand to Sit Details (indicate cue type and reason) Verbal cues for technique;Verbal cues for sequencing;Manual facilitation for weight shifting;Verbal cues for safe use of DME/AE    Stand to Sit Details reaching with LUE first      Ambulation/Gait   Ambulation/Gait Yes    Ambulation/Gait Assistance 3: Mod assist;4: Min assist   modA initially/turns to minA once in motion on straight path   Ambulation/Gait Assistance Details manual / tactile & verbal cues on wt shift to stance limb and upright posture.    Ambulation Distance (Feet) 20 Feet    Assistive device Prostheses;Rolling walker    Gait Pattern Step-to pattern;Decreased step length - right;Decreased step length - left;Decreased hip/knee  flexion - right;Decreased hip/knee flexion - left;Right foot flat;Left foot flat;Right flexed knee in stance;Left flexed knee in stance;Shuffle;Lateral hip instability;Trunk flexed;Poor foot clearance - left;Poor foot clearance - right    Ambulation Surface Level;Indoor      Therapeutic Activites    Other Therapeutic Activities sitting on 24" bar stool with feet supported on floor - trunk motions without UE assist with min guard - 1) forward flexion to upright  2) back lean to upright  3) rotation right & left and 4) side bend right & left reaching to chair.  5 reps each.       Neuro Re-ed    Neuro Re-ed Details  standing at sink with BUE support: weight shift right/left with pelvis, weight shift forward with pelvis then using UEs to push trunk upright 3 reps 5 second hold.      Prosthetics   Prosthetic Care Comments  check skin every 4 hours with wearing prosthetic liner without shrinker under liner.      Current prosthetic wear tolerance (days/week)  daily    Current prosthetic wear tolerance  (#hours/day)  most of awake hours - up to 6hrs without shrinker under liners.     Edema pitting edema    Residual limb condition  no open areas, normal color & temperature,     Education Provided Skin check;Residual limb care;Correct ply sock adjustment;Proper Donning;Proper wear schedule/adjustment;Other (comment)   see prosthetic care comments   Person(s) Educated Patient;Child(ren)    Education Method Explanation;Demonstration;Tactile cues;Verbal cues    Education Method Verbalized understanding;Verbal cues required;Needs further instruction                    PT Short Term Goals - 07/05/19 2047      PT SHORT TERM GOAL #1   Title Patient performs level surface direct contact scooting transfer with bil. prostheses with minA.  (All STGs Target Date: 07/30/2019)    Time 1    Period Months    Status On-going    Target Date 07/30/19      PT SHORT TERM GOAL #2   Title Patient tolerates prostheses wear daily >90% of awake hours without skin issues.    Time 1    Period Months    Status On-going    Target Date 07/30/19      PT SHORT TERM GOAL #3   Title Stand -pivot transfer with RW between w/c & chair with armrests with modA    Time 1    Period Months    Status On-going    Target Date 07/30/19      PT SHORT TERM GOAL #4   Title Standing balance with walker support static 2 minutes with supervision and reaches 10" anteriorly with minA    Time 1    Period Months    Status On-going    Target Date 07/30/19      PT SHORT TERM GOAL #5   Title Patient ambulates 20' & turns 90* to position to sit with RW & prostheses with modA.    Time 1    Period Weeks    Status On-going    Target Date 07/30/19             PT Long Term Goals - 07/05/19 2047      PT LONG TERM GOAL #1   Title Patient and family verbalize and demonstrate understanding of proper prosthetic care to enable safe utilization of prostheses. (All LTGs Target Date: 09/24/2019)    Time  3    Period Months     Status On-going    Target Date 09/24/19      PT LONG TERM GOAL #2   Title Patient tolerates wear of bilateral prostheses >90% of awake hours without skin issues or limb pain to enable functional potential during his day.    Time 3    Period Months    Status On-going    Target Date 09/24/19      PT LONG TERM GOAL #3   Title Squat pivot transfer bed to w/c or chair to w/c with prostheses with supervision.    Time 3    Period Months    Status On-going    Target Date 09/24/19      PT LONG TERM GOAL #4   Title Sit to / from stand elevated w/c to walker with prostheses with minA    Time 3    Period Months    Status On-going    Target Date 09/24/19      PT LONG TERM GOAL #5   Title Standing balance with walker support with bilateral prostheses static stance for 2 minutes, scanning environment and reaches 2" anteriorly with supervision.    Time 3    Period Months    Status On-going    Target Date 09/24/19      PT LONG TERM GOAL #6   Title Patient ambulates 25' between 2 chairs including turning 90* to position to sit with RW & prostheses with minA from family safely.    Time 3    Period Months    Status On-going    Target Date 09/24/19                 Plan - 07/12/19 1252    Clinical Impression Statement PT worked on sit to/from stand breaking down components. PT also worked on core stabilization with activities sitting on 24" bar stool with feet supported on floor.    Personal Factors and Comorbidities Age;Comorbidity 3+;Fitness;Time since onset of injury/illness/exacerbation    Comorbidities Bil TTAs, DM2, PAD, HTN, CHF, A-Fib, TIA, CKDst3,    Examination-Activity Limitations Locomotion Level;Sit;Stand;Toileting;Transfers    Examination-Participation Restrictions Community Activity    Stability/Clinical Decision Making Evolving/Moderate complexity    Rehab Potential Good    PT Frequency 2x / week    PT Duration 12 weeks   3 months   PT Treatment/Interventions  ADLs/Self Care Home Management;DME Instruction;Gait training;Stair training;Functional mobility training;Therapeutic activities;Therapeutic exercise;Balance training;Neuromuscular re-education;Patient/family education;Prosthetic Training;Manual techniques;Passive range of motion;Vestibular    PT Next Visit Plan work towards updated STGs, balance activites seated on 24" stool at sink,  transfers for stand pivot, Nustep, leg press, standing balance & gait with RW when 2nd person available to assist, golf swings seated on mat table with patient in higher position    Consulted and Agree with Plan of Care Patient;Family member/caregiver    Family Member Consulted dtr, [name]           Patient will benefit from skilled therapeutic intervention in order to improve the following deficits and impairments:  Abnormal gait, Cardiopulmonary status limiting activity, Decreased activity tolerance, Decreased balance, Decreased endurance, Decreased knowledge of use of DME, Decreased mobility, Decreased range of motion, Decreased scar mobility, Decreased strength, Increased edema, Impaired flexibility, Impaired UE functional use, Postural dysfunction, Prosthetic Dependency, Pain  Visit Diagnosis: Other abnormalities of gait and mobility  Unsteadiness on feet  Abnormal posture  Weakness generalized  History of fall  Stiffness of right knee,  not elsewhere classified  Stiffness of left knee, not elsewhere classified  Contracture of muscle, multiple sites     Problem List Patient Active Problem List   Diagnosis Date Noted  . Weakness generalized 04/05/2019  . S/P BKA (below knee amputation) unilateral, left ([place]) 02/15/2019  . Pressure injury of skin 02/03/2019  . TIA (transient ischemic attack) 02/03/2019  . Transient speech disturbance 02/02/2019  . Chronic indwelling Foley catheter 01/22/2019  . Postoperative wound infection 01/20/2019  . Normocytic anemia 01/18/2019  . Cellulitis  01/18/2019  . Infection of deep incisional surgical site after procedure   . Abnormal urinalysis   . Labile blood glucose   . Urinary retention   . S/P bilateral BKA (below knee amputation) ([place])   . Acute blood loss anemia   . Urinary retention due to benign prostatic hyperplasia 12/28/2018  . Unilateral complete BKA, left, initial encounter ([place]) 12/25/2018  . Dehiscence of amputation stump ([place])   . History of transmetatarsal amputation of left foot ([place]) 12/09/2018  . Critical limb ischemia with history of revascularization of same extremity   . Gangrene of left foot ([place])   . Diabetic ulcer of toe of left foot associated with type 2 diabetes mellitus ([place])   . Cellulitis of left lower extremity   . Toe infection 11/18/2018  . Peripheral edema   . Acute on chronic diastolic CHF (congestive heart failure) ([place]) 11/09/2018  . S/P BKA (below knee amputation) unilateral, right ([place]) 10/05/2018  . Hyponatremia   . Essential hypertension   . Postoperative pain   . Pain of left heel   . Unable to maintain weight-bearing   . Type 2 diabetes mellitus with diabetic peripheral angiopathy with gangrene ([place])   . Anemia due to acute blood loss   . Chronic kidney disease, stage 3a   . Acquired absence of right leg below knee ([place]) 09/07/2018  . Gangrene of right foot ([place])   . DNR (do not resuscitate) discussion   . Goals of care, counseling/discussion   . Palliative care by specialist   . Type 2 diabetes mellitus with vascular disease ([place])   . Severe protein-calorie malnutrition ([place])   . Ischemic foot 08/20/2018  . Critical lower limb ischemia 08/03/2018  . Type 2 diabetes mellitus with foot ulcer, without long-term current use of insulin ([place])   . Gastroesophageal reflux disease   . Cellulitis of great toe of right foot 05/29/2018  . Atrial fibrillation, chronic   . Chest pain 07/25/2017  . PAF (paroxysmal atrial fibrillation) ([place]) 07/25/2017  . BPH (benign prostatic hyperplasia)  07/25/2017  . Shortness of breath 05/11/2017  . Hypothyroidism 05/11/2017  . Chronic diastolic CHF (congestive heart failure) ([place]) 05/11/2017  . Moderate aortic stenosis 06/12/2016  . RBBB 06/12/2016  . Peripheral vascular disease ([place]) 08/04/2012  . Essential hypertension, benign 08/04/2012  . Hyperlipidemia 08/04/2012  . Dizziness 08/04/2012    Jamey Reas PT, DPT 07/12/2019, 10:23 PM  [facility] Physical Therapy [address] [place], [place], [address] Phone: [phone]   Fax:  [phone]  Name: [name] MRN: [id number] Date of Birth: 17-Apr-1927

## 2019-07-14 DIAGNOSIS — I129 Hypertensive chronic kidney disease with stage 1 through stage 4 chronic kidney disease, or unspecified chronic kidney disease: Secondary | ICD-10-CM | POA: Diagnosis not present

## 2019-07-14 DIAGNOSIS — N183 Chronic kidney disease, stage 3 unspecified: Secondary | ICD-10-CM | POA: Diagnosis not present

## 2019-07-14 DIAGNOSIS — E1122 Type 2 diabetes mellitus with diabetic chronic kidney disease: Secondary | ICD-10-CM | POA: Diagnosis not present

## 2019-07-14 DIAGNOSIS — I4891 Unspecified atrial fibrillation: Secondary | ICD-10-CM | POA: Diagnosis not present

## 2019-07-15 ENCOUNTER — Encounter: Payer: Self-pay | Admitting: Physical Therapy

## 2019-07-15 ENCOUNTER — Other Ambulatory Visit: Payer: Self-pay

## 2019-07-15 ENCOUNTER — Ambulatory Visit (INDEPENDENT_AMBULATORY_CARE_PROVIDER_SITE_OTHER): Payer: Medicare Other | Admitting: Physical Therapy

## 2019-07-15 DIAGNOSIS — R293 Abnormal posture: Secondary | ICD-10-CM

## 2019-07-15 DIAGNOSIS — M6281 Muscle weakness (generalized): Secondary | ICD-10-CM

## 2019-07-15 DIAGNOSIS — R2681 Unsteadiness on feet: Secondary | ICD-10-CM

## 2019-07-15 DIAGNOSIS — M25661 Stiffness of right knee, not elsewhere classified: Secondary | ICD-10-CM

## 2019-07-15 DIAGNOSIS — R2689 Other abnormalities of gait and mobility: Secondary | ICD-10-CM | POA: Diagnosis not present

## 2019-07-15 DIAGNOSIS — M6249 Contracture of muscle, multiple sites: Secondary | ICD-10-CM

## 2019-07-15 DIAGNOSIS — Z9181 History of falling: Secondary | ICD-10-CM

## 2019-07-15 DIAGNOSIS — M25662 Stiffness of left knee, not elsewhere classified: Secondary | ICD-10-CM

## 2019-07-15 NOTE — Therapy (Signed)
[facility] [address] [place], [place], [address] Phone: [phone]   Fax:  [phone]  Physical Therapy Treatment & 10th Visit Progress Note  Patient Details  Name: [name] MRN: [id number] Date of Birth: Aug 12, 1927 Referring Provider (PT): [name], MD   Encounter Date: 07/15/2019   Progress Note Reporting Period 06/15/2019 to 07/15/2019  See note below for Objective Data and Assessment of Progress/Goals.        PT End of Session - 07/15/19 1421    Visit Number 30    Number of Visits 50    Date for PT Re-Evaluation 06/30/19    Authorization Type [employer] Medicare    Authorization Time Period $30 co-pay    Progress Note Due on Visit 30    PT Start Time 1145    PT Stop Time 1230    PT Time Calculation (min) 45 min    Equipment Utilized During Treatment Gait belt    Activity Tolerance Patient tolerated treatment well    Behavior During Therapy WFL for tasks assessed/performed           Past Medical History:  Diagnosis Date  . Arthritis   . Borderline diabetic   . Diabetes mellitus without complication ([place])   . Glaucoma   . Hyperlipidemia   . Hypertension   . PVD (peripheral vascular disease) ([place])   . Sleep apnea    Cpap ordered but doesnt use  . Urinary retention due to benign prostatic hyperplasia 12/28/2018    Past Surgical History:  Procedure Laterality Date  . ABDOMINAL AORTOGRAM W/LOWER EXTREMITY N/A 07/27/2018   Procedure: ABDOMINAL AORTOGRAM W/LOWER EXTREMITY;  Surgeon: Lorretta Harp, MD;  Location: [place] [facility];  Service: Cardiovascular;  Laterality: N/A;  . AMPUTATION Right 08/28/2018   Procedure: RIGHT BELOW KNEE AMPUTATION;  Surgeon: [name], MD;  Location: [place];  Service: Orthopedics;  Laterality: Right;  . AMPUTATION Left 12/02/2018   Procedure: LEFT TRANSMETATARSAL AMPUTATION AND NEGATIVE PRESSURE WOUND VAC PLACEMENT;  Surgeon: [name], MD;  Location: [place];  Service:  Orthopedics;  Laterality: Left;  . AMPUTATION Left 12/18/2018   Procedure: LEFT BELOW KNEE AMPUTATION;  Surgeon: [name], MD;  Location: [place];  Service: Orthopedics;  Laterality: Left;  . APPENDECTOMY  1943  . BELOW KNEE LEG AMPUTATION Left 12/18/2018  . CATARACT EXTRACTION  2012   x2  . COLONOSCOPY N/A 11/01/2014   Procedure: COLONOSCOPY;  Surgeon: Aviva Signs, MD;  [facility];  Service: Gastroenterology;  Laterality: N/A;  . ESOPHAGOGASTRODUODENOSCOPY N/A 11/01/2014   Procedure: ESOPHAGOGASTRODUODENOSCOPY (EGD);  Surgeon: Aviva Signs, MD;  [facility];  Service: Gastroenterology;  Laterality: N/A;  . HERNIA REPAIR Left   . INGUINAL HERNIA REPAIR Right 03/01/2013   Procedure: RIGHT INGUINAL HERNIORRHAPHY;  Surgeon: Jamesetta So, MD;  [facility]: [facility];  Service: General;  Laterality: Right;  . INSERTION OF MESH Right 03/01/2013   Procedure: INSERTION OF MESH;  Surgeon: Jamesetta So, MD;  [facility];  Service: General;  Laterality: Right;  . LOWER EXTREMITY ANGIOGRAPHY Right 08/25/2018   Procedure: LOWER EXTREMITY ANGIOGRAPHY;  Surgeon: Wellington Hampshire, MD;  Location: [place] [facility];  Service: Cardiovascular;  Laterality: Right;  . LOWER EXTREMITY ANGIOGRAPHY N/A 11/25/2018   Procedure: LOWER EXTREMITY ANGIOGRAPHY;  Surgeon: Wellington Hampshire, MD;  Location: [place] [facility];  Service: Cardiovascular;  Laterality: N/A;  . NM MYOCAR PERF WALL MOTION  06/05/2010  no significant ischemia  . PERIPHERAL VASCULAR ATHERECTOMY Right 08/03/2018   Procedure: PERIPHERAL VASCULAR ATHERECTOMY;  Surgeon: Lorretta Harp, MD;  Location: [place] [facility];  Service: Cardiovascular;  Laterality: Right;  . PERIPHERAL VASCULAR ATHERECTOMY Left 11/23/2018   Procedure: PERIPHERAL VASCULAR ATHERECTOMY;  Surgeon: Lorretta Harp, MD;  Location: [place] [facility];  Service: Cardiovascular;  Laterality: Left;  sfa with dc balloon  . PERIPHERAL VASCULAR BALLOON  ANGIOPLASTY Left 11/25/2018   Procedure: PERIPHERAL VASCULAR BALLOON ANGIOPLASTY;  Surgeon: Wellington Hampshire, MD;  Location: [place] [facility];  Service: Cardiovascular;  Laterality: Left;  popliteal  . PERIPHERAL VASCULAR INTERVENTION Left 11/25/2018   Procedure: PERIPHERAL VASCULAR INTERVENTION;  Surgeon: Wellington Hampshire, MD;  Location: [place] [facility];  Service: Cardiovascular;  Laterality: Left;  tibial peroneal trunk and peroneal stents   . stent  06/14/2010   left leg  . STUMP REVISION Left 01/20/2019   Procedure: REVISION LEFT BELOW KNEE AMPUTATION;  Surgeon: [name], MD;  Location: [place];  Service: Orthopedics;  Laterality: Left;  . US ECHOCARDIOGRAPHY  01/21/2006   moderate mitral annular ca+, mild MR, AOV moderately sclerotic.    There were no vitals filed for this visit.   Subjective Assessment - 07/15/19 1145    Subjective Son brought pictures of patient's golf cart with patient sitting in w/c beside it. The golf cart seat is ~8-9" higher than w/c seat.  Patient wants to be able to get in golf cart to ride around their farm property.    Patient is accompained by: Family member   son, [name]   Pertinent History Bil TTAs, DM2, PAD, HTN, CHF, A-Fib, TIA, CKDst3    Patient Stated Goals walk with prostheses in home & in/out house. Son would like him to be able to toilet & get in/out bed by himself.    Currently in Pain? No/denies    Pain Onset In the past 7 days                             [facility] Adult PT Treatment/Exercise - 07/15/19 1145      Transfers   Transfers Sit to Stand;Stand to Sit;Squat Pivot Transfers;Lateral/Scoot Transfers;Stand Pivot Transfers    Sit to Stand 3: Mod assist;2: Max assist;With upper extremity assist;With armrests;From chair/3-in-1;Other (comment)   ModA from 24" bar stool & MaxA from w/c   Sit to Stand Details Manual facilitation for weight shifting;Verbal cues for safe use of DME/AE;Verbal cues for technique;Verbal  cues for sequencing    Stand to Sit 4: Min assist;With upper extremity assist;With armrests;To chair/3-in-1;Other (comment)   from bar stool & from RW   Stand to Sit Details (indicate cue type and reason) Verbal cues for technique;Verbal cues for sequencing;Manual facilitation for weight shifting;Verbal cues for safe use of DME/AE      Ambulation/Gait   Ambulation/Gait Yes    Ambulation/Gait Assistance 3: Mod assist;4: Min assist   modA initially/turns to minA once in motion on straight path   Ambulation/Gait Assistance Details PT demo & instructed pt & son in technique PT was using to assist  (RUE on gait belt at midback and LUE on RW to control position).  PT demo safety issues if RW gets too far anterior causing patient to flex trunk & buckle knees.     Ambulation Distance (Feet) 15 Feet   15' & 10" +90* turn to sit   Assistive device  Prostheses;Rolling walker    Gait Pattern Step-to pattern;Decreased step length - right;Decreased step length - left;Decreased hip/knee flexion - right;Decreased hip/knee flexion - left;Right foot flat;Left foot flat;Right flexed knee in stance;Left flexed knee in stance;Shuffle;Lateral hip instability;Trunk flexed;Poor foot clearance - left;Poor foot clearance - right    Ambulation Surface Level;Indoor      Therapeutic Activites    Other Therapeutic Activities --      Neuro Re-ed    Neuro Re-ed Details  --      Knee/Hip Exercises: Machines for Strengthening   Cybex Leg Press Shuttle Leg Press Back at 45* BLEs 87# Back flat BLEs 87# 10 reps then 37# alternating single LEs with PT assisting / holding LE not pushing on platform for 10 reps / LE.  Then 50# single LE 5 reps ea.       Prosthetics   Prosthetic Care Comments  --    Current prosthetic wear tolerance (days/week)  daily    Current prosthetic wear tolerance (#hours/day)  most of awake hours - up to 6hrs without shrinker under liners.     Edema pitting edema    Residual limb condition  no open areas,  normal color & temperature,     Education Provided --                  PT Education - 07/15/19 1411    Education Details to transfer [name] to golf cart, PT recommending removing side bar on passenger side. Build a platform with ramp (similar to ramp / curb in PT gym) and his w/c seat will be same height as golf cart seat. Then sliding board transfer to golf cart.    Person(s) Educated Patient;Child(ren)    Methods Explanation;Demonstration;Verbal cues    Comprehension Verbalized understanding;Need further instruction            PT Short Term Goals - 07/05/19 2047      PT SHORT TERM GOAL #1   Title Patient performs level surface direct contact scooting transfer with bil. prostheses with minA.  (All STGs Target Date: 07/30/2019)    Time 1    Period Months    Status On-going    Target Date 07/30/19      PT SHORT TERM GOAL #2   Title Patient tolerates prostheses wear daily >90% of awake hours without skin issues.    Time 1    Period Months    Status On-going    Target Date 07/30/19      PT SHORT TERM GOAL #3   Title Stand -pivot transfer with RW between w/c & chair with armrests with modA    Time 1    Period Months    Status On-going    Target Date 07/30/19      PT SHORT TERM GOAL #4   Title Standing balance with walker support static 2 minutes with supervision and reaches 10" anteriorly with minA    Time 1    Period Months    Status On-going    Target Date 07/30/19      PT SHORT TERM GOAL #5   Title Patient ambulates 20' & turns 90* to position to sit with RW & prostheses with modA.    Time 1    Period Weeks    Status On-going    Target Date 07/30/19             PT Long Term Goals - 07/05/19 2047      PT LONG TERM GOAL #  1   Title Patient and family verbalize and demonstrate understanding of proper prosthetic care to enable safe utilization of prostheses. (All LTGs Target Date: 09/24/2019)    Time 3    Period Months    Status On-going    Target  Date 09/24/19      PT LONG TERM GOAL #2   Title Patient tolerates wear of bilateral prostheses >90% of awake hours without skin issues or limb pain to enable functional potential during his day.    Time 3    Period Months    Status On-going    Target Date 09/24/19      PT LONG TERM GOAL #3   Title Squat pivot transfer bed to w/c or chair to w/c with prostheses with supervision.    Time 3    Period Months    Status On-going    Target Date 09/24/19      PT LONG TERM GOAL #4   Title Sit to / from stand elevated w/c to walker with prostheses with minA    Time 3    Period Months    Status On-going    Target Date 09/24/19      PT LONG TERM GOAL #5   Title Standing balance with walker support with bilateral prostheses static stance for 2 minutes, scanning environment and reaches 2" anteriorly with supervision.    Time 3    Period Months    Status On-going    Target Date 09/24/19      PT LONG TERM GOAL #6   Title Patient ambulates 25' between 2 chairs including turning 90* to position to sit with RW & prostheses with minA from family safely.    Time 3    Period Months    Status On-going    Target Date 09/24/19                 Plan - 07/15/19 1422    Clinical Impression Statement Patient requiring less assistance for gait with RW on straight path. PT educated with demo while assisting patient on technique to assist patient. His mobility is getting closer to enable him to walk with family.  Both his son & dtr appear to understand updated HEP for activities to improve his strength, flexibility and sitting balance.    Personal Factors and Comorbidities Age;Comorbidity 3+;Fitness;Time since onset of injury/illness/exacerbation    Comorbidities Bil TTAs, DM2, PAD, HTN, CHF, A-Fib, TIA, CKDst3,    Examination-Activity Limitations Locomotion Level;Sit;Stand;Toileting;Transfers    Examination-Participation Restrictions Community Activity    Stability/Clinical Decision Making  Evolving/Moderate complexity    Rehab Potential Good    PT Frequency 2x / week    PT Duration 12 weeks   3 months   PT Treatment/Interventions ADLs/Self Care Home Management;DME Instruction;Gait training;Stair training;Functional mobility training;Therapeutic activities;Therapeutic exercise;Balance training;Neuromuscular re-education;Patient/family education;Prosthetic Training;Manual techniques;Passive range of motion;Vestibular    PT Next Visit Plan work towards updated STGs, balance activites seated on 24" stool at sink,  transfers for stand pivot, Nustep, leg press, standing balance & gait with RW including turning 90* to position to sit, instruct family in technique to assist at home    Consulted and Agree with Plan of Care Patient;Family member/caregiver    Family Member Consulted son, [name]           Patient will benefit from skilled therapeutic intervention in order to improve the following deficits and impairments:  Abnormal gait, Cardiopulmonary status limiting activity, Decreased activity tolerance, Decreased balance, Decreased endurance, Decreased  knowledge of use of DME, Decreased mobility, Decreased range of motion, Decreased scar mobility, Decreased strength, Increased edema, Impaired flexibility, Impaired UE functional use, Postural dysfunction, Prosthetic Dependency, Pain  Visit Diagnosis: Muscle weakness (generalized)  Other abnormalities of gait and mobility  Unsteadiness on feet  Abnormal posture  History of fall  Stiffness of right knee, not elsewhere classified  Stiffness of left knee, not elsewhere classified  Contracture of muscle, multiple sites     Problem List Patient Active Problem List   Diagnosis Date Noted  . Weakness generalized 04/05/2019  . S/P BKA (below knee amputation) unilateral, left ([place]) 02/15/2019  . Pressure injury of skin 02/03/2019  . TIA (transient ischemic attack) 02/03/2019  . Transient speech disturbance 02/02/2019  .  Chronic indwelling Foley catheter 01/22/2019  . Postoperative wound infection 01/20/2019  . Normocytic anemia 01/18/2019  . Cellulitis 01/18/2019  . Infection of deep incisional surgical site after procedure   . Abnormal urinalysis   . Labile blood glucose   . Urinary retention   . S/P bilateral BKA (below knee amputation) ([place])   . Acute blood loss anemia   . Urinary retention due to benign prostatic hyperplasia 12/28/2018  . Unilateral complete BKA, left, initial encounter ([place]) 12/25/2018  . Dehiscence of amputation stump ([place])   . History of transmetatarsal amputation of left foot ([place]) 12/09/2018  . Critical limb ischemia with history of revascularization of same extremity   . Gangrene of left foot ([place])   . Diabetic ulcer of toe of left foot associated with type 2 diabetes mellitus ([place])   . Cellulitis of left lower extremity   . Toe infection 11/18/2018  . Peripheral edema   . Acute on chronic diastolic CHF (congestive heart failure) ([place]) 11/09/2018  . S/P BKA (below knee amputation) unilateral, right ([place]) 10/05/2018  . Hyponatremia   . Essential hypertension   . Postoperative pain   . Pain of left heel   . Unable to maintain weight-bearing   . Type 2 diabetes mellitus with diabetic peripheral angiopathy with gangrene ([place])   . Anemia due to acute blood loss   . Chronic kidney disease, stage 3a   . Acquired absence of right leg below knee ([place]) 09/07/2018  . Gangrene of right foot ([place])   . DNR (do not resuscitate) discussion   . Goals of care, counseling/discussion   . Palliative care by specialist   . Type 2 diabetes mellitus with vascular disease ([place])   . Severe protein-calorie malnutrition ([place])   . Ischemic foot 08/20/2018  . Critical lower limb ischemia 08/03/2018  . Type 2 diabetes mellitus with foot ulcer, without long-term current use of insulin ([place])   . Gastroesophageal reflux disease   . Cellulitis of great toe of right foot 05/29/2018  . Atrial  fibrillation, chronic   . Chest pain 07/25/2017  . PAF (paroxysmal atrial fibrillation) ([place]) 07/25/2017  . BPH (benign prostatic hyperplasia) 07/25/2017  . Shortness of breath 05/11/2017  . Hypothyroidism 05/11/2017  . Chronic diastolic CHF (congestive heart failure) ([place]) 05/11/2017  . Moderate aortic stenosis 06/12/2016  . RBBB 06/12/2016  . Peripheral vascular disease ([place]) 08/04/2012  . Essential hypertension, benign 08/04/2012  . Hyperlipidemia 08/04/2012  . Dizziness 08/04/2012    Jamey Reas PT, DPT 07/15/2019, 2:26 PM  [facility] [address] [place], [place], [address] Phone: [phone]   Fax:  [phone]  Name: DOUG BUCKLIN MRN: [id number] Date of Birth: Apr 19, 1927

## 2019-07-20 ENCOUNTER — Other Ambulatory Visit: Payer: Self-pay

## 2019-07-20 ENCOUNTER — Encounter: Payer: Self-pay | Admitting: Physical Therapy

## 2019-07-20 ENCOUNTER — Ambulatory Visit (INDEPENDENT_AMBULATORY_CARE_PROVIDER_SITE_OTHER): Payer: Medicare Other | Admitting: Physical Therapy

## 2019-07-20 DIAGNOSIS — R293 Abnormal posture: Secondary | ICD-10-CM

## 2019-07-20 DIAGNOSIS — R2689 Other abnormalities of gait and mobility: Secondary | ICD-10-CM

## 2019-07-20 DIAGNOSIS — M25662 Stiffness of left knee, not elsewhere classified: Secondary | ICD-10-CM

## 2019-07-20 DIAGNOSIS — M6281 Muscle weakness (generalized): Secondary | ICD-10-CM

## 2019-07-20 DIAGNOSIS — M25661 Stiffness of right knee, not elsewhere classified: Secondary | ICD-10-CM

## 2019-07-20 DIAGNOSIS — M6249 Contracture of muscle, multiple sites: Secondary | ICD-10-CM

## 2019-07-20 DIAGNOSIS — R2681 Unsteadiness on feet: Secondary | ICD-10-CM

## 2019-07-20 NOTE — Therapy (Signed)
[facility] [address] [place], [place], [address] Phone: [phone]   Fax:  [phone]  Physical Therapy Treatment  Patient Details  Name: [name] MRN: [id number] Date of Birth: Jun 10, 1927 Referring Provider (PT): [name], MD   Encounter Date: 07/20/2019   PT End of Session - 07/20/19 1248    Visit Number 31    Number of Visits 50    Date for PT Re-Evaluation 06/30/19    Authorization Type UHC Medicare    Authorization Time Period $30 co-pay    Progress Note Due on Visit 40    PT Start Time 1145    PT Stop Time 1231    PT Time Calculation (min) 46 min    Equipment Utilized During Treatment Gait belt    Activity Tolerance Patient tolerated treatment well    Behavior During Therapy WFL for tasks assessed/performed           Past Medical History:  Diagnosis Date  . Arthritis   . Borderline diabetic   . Diabetes mellitus without complication ([place])   . Glaucoma   . Hyperlipidemia   . Hypertension   . PVD (peripheral vascular disease) ([place])   . Sleep apnea    Cpap ordered but doesnt use  . Urinary retention due to benign prostatic hyperplasia 12/28/2018    Past Surgical History:  Procedure Laterality Date  . ABDOMINAL AORTOGRAM W/LOWER EXTREMITY N/A 07/27/2018   Procedure: ABDOMINAL AORTOGRAM W/LOWER EXTREMITY;  Surgeon: Lorretta Harp, MD;  Location: [facility];  Service: Cardiovascular;  Laterality: N/A;  . AMPUTATION Right 08/28/2018   Procedure: RIGHT BELOW KNEE AMPUTATION;  Surgeon: [name], MD;  Location: [facility];  Service: Orthopedics;  Laterality: Right;  . AMPUTATION Left 12/02/2018   Procedure: LEFT TRANSMETATARSAL AMPUTATION AND NEGATIVE PRESSURE WOUND VAC PLACEMENT;  Surgeon: [name], MD;  Location: [facility];  Service: Orthopedics;  Laterality: Left;  . AMPUTATION Left 12/18/2018   Procedure: LEFT BELOW KNEE AMPUTATION;  Surgeon: [name], MD;  Location: [facility];  Service: Orthopedics;   Laterality: Left;  . APPENDECTOMY  1943  . BELOW KNEE LEG AMPUTATION Left 12/18/2018  . CATARACT EXTRACTION  2012   x2  . COLONOSCOPY N/A 11/01/2014   Procedure: COLONOSCOPY;  Surgeon: Aviva Signs, MD;  [facility];  Service: Gastroenterology;  Laterality: N/A;  . ESOPHAGOGASTRODUODENOSCOPY N/A 11/01/2014   Procedure: ESOPHAGOGASTRODUODENOSCOPY (EGD);  Surgeon: Aviva Signs, MD;  [facility];  Service: Gastroenterology;  Laterality: N/A;  . HERNIA REPAIR Left   . INGUINAL HERNIA REPAIR Right 03/01/2013   Procedure: RIGHT INGUINAL HERNIORRHAPHY;  Surgeon: Jamesetta So, MD;  Location: [facility];  Service: General;  Laterality: Right;  . INSERTION OF MESH Right 03/01/2013   Procedure: INSERTION OF MESH;  Surgeon: Jamesetta So, MD;  Location: [facility];  Service: General;  Laterality: Right;  . LOWER EXTREMITY ANGIOGRAPHY Right 08/25/2018   Procedure: LOWER EXTREMITY ANGIOGRAPHY;  Surgeon: Wellington Hampshire, MD;  Location: [facility];  Service: Cardiovascular;  Laterality: Right;  . LOWER EXTREMITY ANGIOGRAPHY N/A 11/25/2018   Procedure: LOWER EXTREMITY ANGIOGRAPHY;  Surgeon: Wellington Hampshire, MD;  Location: [facility];  Service: Cardiovascular;  Laterality: N/A;  . NM MYOCAR PERF WALL MOTION  06/05/2010   no significant ischemia  . PERIPHERAL VASCULAR ATHERECTOMY Right 08/03/2018   Procedure: PERIPHERAL VASCULAR ATHERECTOMY;  Surgeon: Lorretta Harp, MD;  Location: [facility];  Service:  Cardiovascular;  Laterality: Right;  . PERIPHERAL VASCULAR ATHERECTOMY Left 11/23/2018   Procedure: PERIPHERAL VASCULAR ATHERECTOMY;  Surgeon: Lorretta Harp, MD;  Location: [facility];  Service: Cardiovascular;  Laterality: Left;  sfa with dc balloon  . PERIPHERAL VASCULAR BALLOON ANGIOPLASTY Left 11/25/2018   Procedure: PERIPHERAL VASCULAR BALLOON ANGIOPLASTY;  Surgeon: Wellington Hampshire, MD;  Location: [facility];  Service: Cardiovascular;   Laterality: Left;  popliteal  . PERIPHERAL VASCULAR INTERVENTION Left 11/25/2018   Procedure: PERIPHERAL VASCULAR INTERVENTION;  Surgeon: Wellington Hampshire, MD;  Location: [facility];  Service: Cardiovascular;  Laterality: Left;  tibial peroneal trunk and peroneal stents   . stent  06/14/2010   left leg  . STUMP REVISION Left 01/20/2019   Procedure: REVISION LEFT BELOW KNEE AMPUTATION;  Surgeon: [name], MD;  Location: [facility];  Service: Orthopedics;  Laterality: Left;  . US ECHOCARDIOGRAPHY  01/21/2006   moderate mitral annular ca+, mild MR, AOV moderately sclerotic.    There were no vitals filed for this visit.   Subjective Assessment - 07/20/19 1145    Subjective Son was able to get side rail off golf cart and found wood beams to build platform ~8.5" tall    Patient is accompained by: Family member   son, Devansh Riese   Pertinent History Bil TTAs, DM2, PAD, HTN, CHF, A-Fib, TIA, CKDst3    Patient Stated Goals walk with prostheses in home & in/out house. Son would like him to be able to toilet & get in/out bed by himself.    Currently in Pain? No/denies    Pain Onset In the past 7 days                             [facility] Adult PT Treatment/Exercise - 07/20/19 1145      Transfers   Transfers Sit to Stand;Stand to Sit;Squat Pivot Transfers;Lateral/Scoot Transfers;Stand Pivot Transfers    Sit to Stand 3: Mod assist;With upper extremity assist;With armrests;From chair/3-in-1;Other (comment);2: Max assist   ModA from 24" bar stool & MaxA from w/c   Sit to Stand Details Manual facilitation for weight shifting;Verbal cues for safe use of DME/AE;Verbal cues for technique;Verbal cues for sequencing    Stand to Sit 4: Min assist;With upper extremity assist;With armrests;To chair/3-in-1;Other (comment)   from bar stool & from RW   Stand to Sit Details (indicate cue type and reason) Verbal cues for technique;Verbal cues for sequencing;Manual facilitation for weight  shifting;Verbal cues for safe use of DME/AE    Squat Pivot Transfers 3: Mod assist;With upper extremity assistance    Squat Pivot Transfer Details (indicate cue type and reason) verbal & manual cues on wt shift forward over feet    Comments sit to stand at sink BUEs pulling on sink with min guard, one hand pull & other hand pushing on w/c with minA  1 rep ea      Ambulation/Gait   Ambulation/Gait Yes    Ambulation/Gait Assistance 3: Mod assist;4: Min assist   modA initially/turns to minA once in motion on straight path   Ambulation/Gait Assistance Details PT demo & instructed pt & dtr in technique PT was using to assist  (RUE on gait belt at midback and LUE on RW to control position).  PT demo safety issues if RW gets too far anterior causing patient to flex trunk & buckle knees.    Ambulation Distance (Feet) 15 Feet  15' X 2   Assistive device Prostheses;Rolling walker    Gait Pattern Step-to pattern;Decreased step length - right;Decreased step length - left;Decreased hip/knee flexion - right;Decreased hip/knee flexion - left;Right foot flat;Left foot flat;Right flexed knee in stance;Left flexed knee in stance;Shuffle;Lateral hip instability;Trunk flexed;Poor foot clearance - left;Poor foot clearance - right    Ambulation Surface Level;Indoor      Neuro Re-ed    Neuro Re-ed Details  standing at sink with BUE support: alternate knee flexion (lifting heel off floor) & small forward kicks.  Tactile & verbal cues to stand upright between ea rep.       Knee/Hip Exercises: Machines for Strengthening   Cybex Leg Press Shuttle Leg Press Back at 45* BLEs 87# Back flat BLEs 87# 10 reps 50# single LE 10 reps ea.       Prosthetics   Prosthetic Care Comments  Resume wearing shrinkers under liners due to redness of skin noted by son. Need redonning prostheses when liner has slipped.     Current prosthetic wear tolerance (days/week)  daily    Current prosthetic wear tolerance (#hours/day)  most of awake  hours - up to 6hrs without shrinker under liners.     Edema pitting edema    Residual limb condition  no open areas, normal color & temperature,     Education Provided Skin check;Residual limb care;Proper Donning    Person(s) Educated Patient;Child(ren)    Education Method Explanation;Demonstration;Verbal cues    Education Method Verbalized understanding;Needs further instruction                    PT Short Term Goals - 07/05/19 2047      PT SHORT TERM GOAL #1   Title Patient performs level surface direct contact scooting transfer with bil. prostheses with minA.  (All STGs Target Date: 07/30/2019)    Time 1    Period Months    Status On-going    Target Date 07/30/19      PT SHORT TERM GOAL #2   Title Patient tolerates prostheses wear daily >90% of awake hours without skin issues.    Time 1    Period Months    Status On-going    Target Date 07/30/19      PT SHORT TERM GOAL #3   Title Stand -pivot transfer with RW between w/c & chair with armrests with modA    Time 1    Period Months    Status On-going    Target Date 07/30/19      PT SHORT TERM GOAL #4   Title Standing balance with walker support static 2 minutes with supervision and reaches 10" anteriorly with minA    Time 1    Period Months    Status On-going    Target Date 07/30/19      PT SHORT TERM GOAL #5   Title Patient ambulates 20' & turns 90* to position to sit with RW & prostheses with modA.    Time 1    Period Weeks    Status On-going    Target Date 07/30/19             PT Long Term Goals - 07/05/19 2047      PT LONG TERM GOAL #1   Title Patient and family verbalize and demonstrate understanding of proper prosthetic care to enable safe utilization of prostheses. (All LTGs Target Date: 09/24/2019)    Time 3    Period Months    Status On-going  Target Date 09/24/19      PT LONG TERM GOAL #2   Title Patient tolerates wear of bilateral prostheses >90% of awake hours without skin issues or  limb pain to enable functional potential during his day.    Time 3    Period Months    Status On-going    Target Date 09/24/19      PT LONG TERM GOAL #3   Title Squat pivot transfer bed to w/c or chair to w/c with prostheses with supervision.    Time 3    Period Months    Status On-going    Target Date 09/24/19      PT LONG TERM GOAL #4   Title Sit to / from stand elevated w/c to walker with prostheses with minA    Time 3    Period Months    Status On-going    Target Date 09/24/19      PT LONG TERM GOAL #5   Title Standing balance with walker support with bilateral prostheses static stance for 2 minutes, scanning environment and reaches 2" anteriorly with supervision.    Time 3    Period Months    Status On-going    Target Date 09/24/19      PT LONG TERM GOAL #6   Title Patient ambulates 25' between 2 chairs including turning 90* to position to sit with RW & prostheses with minA from family safely.    Time 3    Period Months    Status On-going    Target Date 09/24/19                 Plan - 07/20/19 1248    Clinical Impression Statement Patient's sit to stand at sink requires less assistance.  PT instructed daughter in technique to assist her dad in gait but he still requires too much skilled assistance to attempt outside of PT yet.    Personal Factors and Comorbidities Age;Comorbidity 3+;Fitness;Time since onset of injury/illness/exacerbation    Comorbidities Bil TTAs, DM2, PAD, HTN, CHF, A-Fib, TIA, CKDst3,    Examination-Activity Limitations Locomotion Level;Sit;Stand;Toileting;Transfers    Examination-Participation Restrictions Community Activity    Stability/Clinical Decision Making Evolving/Moderate complexity    Rehab Potential Good    PT Frequency 2x / week    PT Duration 12 weeks   3 months   PT Treatment/Interventions ADLs/Self Care Home Management;DME Instruction;Gait training;Stair training;Functional mobility training;Therapeutic activities;Therapeutic  exercise;Balance training;Neuromuscular re-education;Patient/family education;Prosthetic Training;Manual techniques;Passive range of motion;Vestibular    PT Next Visit Plan work towards updated STGs, balance activites seated on 24" stool at sink,  transfers for stand pivot, Nustep, leg press, standing balance & gait with RW including turning 90* to position to sit, instruct family in technique to assist at home    Consulted and Agree with Plan of Care Patient;Family member/caregiver    Family Member Consulted son, Tayveon Lombardo           Patient will benefit from skilled therapeutic intervention in order to improve the following deficits and impairments:  Abnormal gait, Cardiopulmonary status limiting activity, Decreased activity tolerance, Decreased balance, Decreased endurance, Decreased knowledge of use of DME, Decreased mobility, Decreased range of motion, Decreased scar mobility, Decreased strength, Increased edema, Impaired flexibility, Impaired UE functional use, Postural dysfunction, Prosthetic Dependency, Pain  Visit Diagnosis: Other abnormalities of gait and mobility  Muscle weakness (generalized)  Unsteadiness on feet  Abnormal posture  Stiffness of right knee, not elsewhere classified  Stiffness of left knee, not elsewhere classified  Contracture of muscle, multiple sites     Problem List Patient Active Problem List   Diagnosis Date Noted  . Weakness generalized 04/05/2019  . S/P BKA (below knee amputation) unilateral, left ([place]) 02/15/2019  . Pressure injury of skin 02/03/2019  . TIA (transient ischemic attack) 02/03/2019  . Transient speech disturbance 02/02/2019  . Chronic indwelling Foley catheter 01/22/2019  . Postoperative wound infection 01/20/2019  . Normocytic anemia 01/18/2019  . Cellulitis 01/18/2019  . Infection of deep incisional surgical site after procedure   . Abnormal urinalysis   . Labile blood glucose   . Urinary retention   . S/P bilateral  BKA (below knee amputation) ([place])   . Acute blood loss anemia   . Urinary retention due to benign prostatic hyperplasia 12/28/2018  . Unilateral complete BKA, left, initial encounter ([place]) 12/25/2018  . Dehiscence of amputation stump ([place])   . History of transmetatarsal amputation of left foot ([place]) 12/09/2018  . Critical limb ischemia with history of revascularization of same extremity   . Gangrene of left foot ([place])   . Diabetic ulcer of toe of left foot associated with type 2 diabetes mellitus ([place])   . Cellulitis of left lower extremity   . Toe infection 11/18/2018  . Peripheral edema   . Acute on chronic diastolic CHF (congestive heart failure) ([place]) 11/09/2018  . S/P BKA (below knee amputation) unilateral, right ([place]) 10/05/2018  . Hyponatremia   . Essential hypertension   . Postoperative pain   . Pain of left heel   . Unable to maintain weight-bearing   . Type 2 diabetes mellitus with diabetic peripheral angiopathy with gangrene ([place])   . Anemia due to acute blood loss   . Chronic kidney disease, stage 3a   . Acquired absence of right leg below knee ([place]) 09/07/2018  . Gangrene of right foot ([place])   . DNR (do not resuscitate) discussion   . Goals of care, counseling/discussion   . Palliative care by specialist   . Type 2 diabetes mellitus with vascular disease ([place])   . Severe protein-calorie malnutrition ([place])   . Ischemic foot 08/20/2018  . Critical lower limb ischemia 08/03/2018  . Type 2 diabetes mellitus with foot ulcer, without long-term current use of insulin ([place])   . Gastroesophageal reflux disease   . Cellulitis of great toe of right foot 05/29/2018  . Atrial fibrillation, chronic   . Chest pain 07/25/2017  . PAF (paroxysmal atrial fibrillation) ([place]) 07/25/2017  . BPH (benign prostatic hyperplasia) 07/25/2017  . Shortness of breath 05/11/2017  . Hypothyroidism 05/11/2017  . Chronic diastolic CHF (congestive heart failure) ([place]) 05/11/2017  . Moderate aortic  stenosis 06/12/2016  . RBBB 06/12/2016  . Peripheral vascular disease ([place]) 08/04/2012  . Essential hypertension, benign 08/04/2012  . Hyperlipidemia 08/04/2012  . Dizziness 08/04/2012    Jamey Reas PT, DPT 07/20/2019, 12:52 PM  [facility] [address] [place], [place], [address] Phone: [phone]   Fax:  [phone]  Name: [name] MRN: [id number] Date of Birth: 27-Nov-1927

## 2019-07-21 ENCOUNTER — Other Ambulatory Visit: Payer: Self-pay | Admitting: *Deleted

## 2019-07-21 DIAGNOSIS — D3131 Benign neoplasm of right choroid: Secondary | ICD-10-CM | POA: Diagnosis not present

## 2019-07-21 DIAGNOSIS — E119 Type 2 diabetes mellitus without complications: Secondary | ICD-10-CM | POA: Diagnosis not present

## 2019-07-21 DIAGNOSIS — Z961 Presence of intraocular lens: Secondary | ICD-10-CM | POA: Diagnosis not present

## 2019-07-21 DIAGNOSIS — H401111 Primary open-angle glaucoma, right eye, mild stage: Secondary | ICD-10-CM | POA: Diagnosis not present

## 2019-07-21 DIAGNOSIS — H401122 Primary open-angle glaucoma, left eye, moderate stage: Secondary | ICD-10-CM | POA: Diagnosis not present

## 2019-07-21 NOTE — Patient Outreach (Signed)
[facility]) Care Management  [place]  07/21/2019   [name] [birth year] [id number]  Subjective: Successful telephone outreach call to patient's son and daughter. HIPAA identifiers obtained. Per son patient is doing well. His stumps remain fully healed and patient is working with PT routinely to increase his mobility. Patient's daughter reports that his last FBS was 158 and that blood sugars are being done 3-4 times a week as advised by PCP due to last A1c of 5.6 and patient's fingers being sore. Son reports that the patient continues to have a good appetite and continues to have his foley catheter routinely changed at his urologist's office. Both son and daughter states that the patient's diabetes is controlled at this time and they do not have any further questions or concerns. Nurse ensured that they did have her contact number and daughter states she will call nurse if needed.   Encounter Medications:  Outpatient Encounter Medications as of 07/21/2019  Medication Sig  . amLODipine (NORVASC) 2.5 MG tablet Take 2.5 mg by mouth daily.   [name] apixaban (ELIQUIS) 2.5 MG TABS tablet Take 2.5 mg by mouth 2 (two) times daily.  [name] aspirin EC 81 MG tablet Take 81 mg by mouth daily.  [name] atorvastatin (LIPITOR) 40 MG tablet TAKE 1 TABLET BY MOUTH EVERY DAY  . clopidogrel (PLAVIX) 75 MG tablet Take 1 tablet (75 mg total) by mouth daily with breakfast.  . diclofenac Sodium (VOLTAREN) 1 % GEL Apply 2 g topically 4 (four) times daily.  . dorzolamide-timolol (COSOPT) 22.3-6.8 MG/ML ophthalmic solution Place 1 drop into both eyes 2 (two) times daily.  . famotidine (PEPCID) 20 MG tablet Take 1 tablet (20 mg total) by mouth daily.  . ferrous sulfate 325 (65 FE) MG tablet Take 325 mg by mouth 2 (two) times daily with a meal.  . finasteride (PROSCAR) 5 MG tablet Take 1 tablet (5 mg total) by mouth every evening.  . furosemide (LASIX) 40 MG tablet Take 22m by mouth in the morning and 253m by mouth in the evening.  . Marland KitchenlipiZIDE (GLUCOTROL) 5 MG tablet Take 5 mg by mouth 2 (two) times daily.   . Marland Kitchenevothyroxine (SYNTHROID) 75 MCG tablet Take 75 mcg by mouth daily.  . methocarbamol (ROBAXIN) 500 MG tablet Take 1 tablet (500 mg total) by mouth every 6 (six) hours as needed for muscle spasms.  . Misc Natural Products (GLUCOSAMINE CHOND MSM FORMULA PO) Take 1 tablet by mouth 2 (two) times daily.  . Omega-3 Fatty Acids (FISH OIL PO) Take 1 capsule by mouth daily.  . Glory RosebushLTRA test strip 1 each 2 (two) times daily.  . polyethylene glycol (MIRALAX) 17 g packet Take 17 g by mouth daily as needed for moderate constipation.  . potassium chloride (KLOR-CON) 10 MEQ tablet Take 1 tablet (10 mEq total) by mouth daily.  . Marland Kitchenyridoxine (B-6) 100 MG tablet Take 100 mg by mouth daily.  . tamsulosin (FLOMAX) 0.4 MG CAPS capsule Take 1 capsule (0.4 mg total) by mouth 2 (two) times daily.  . traMADol (ULTRAM) 50 MG tablet Take 1 tablet (50 mg total) by mouth every 6 (six) hours as needed.  . vitamin C (ASCORBIC ACID) 500 MG tablet Take 500 mg by mouth daily.  . vitamin E 400 UNIT capsule Take 400 Units by mouth daily.   No facility-administered encounter medications on file as of 07/21/2019.    Functional Status:  In your present state of health, do you have any  difficulty performing the following activities: 06/02/2019 02/03/2019  Hearing? Y Y  Comment per son but no hearing aid -  Vision? N N  Difficulty concentrating or making decisions? N Y  Walking or climbing stairs? Y Y  Comment Bilateral BKA, family assists -  Dressing or bathing? Y N  Comment Bilateral BKA, family assists -  Doing errands, shopping? Y Y  Comment Bilateral BKA, family assists -  Conservation officer, nature and eating ? Y -  Comment Bilateral BKA, family assists -  Using the Toilet? Y -  Comment Bilateral BKA family assists -  In the past six months, have you accidently leaked urine? N -  Comment patient has foley catheter -  Do  you have problems with loss of bowel control? N -  Managing your Medications? Y -  Comment Daughter assists with pillbox -  Managing your Finances? Y -  Comment family assist -  Housekeeping or managing your Housekeeping? Y -  Comment family assist -  Some recent data might be hidden    Fall/Depression Screening: Fall Risk  07/21/2019 06/02/2019 02/15/2019  Falls in the past year? 1 1 0  Number falls in past yr: 1 1 -  Injury with Fall? 1 1 -  Risk for fall due to : Impaired mobility;Impaired balance/gait Impaired mobility;Impaired balance/gait;History of fall(s) -  Risk for fall due to: Comment Bilateral amputation bilateral BKA -  Follow up Falls prevention discussed;Education provided;Falls evaluation completed Falls prevention discussed;Education provided;Falls evaluation completed -   PHQ 2/9 Scores 06/02/2019 01/12/2019  PHQ - 2 Score 1 2  PHQ- 9 Score - 3   Goals Addressed            This Visit's Progress   . Patient will maintaint A1c below 7 within the next 90 days       [place] (see longtitudinal plan of care for additional care plan information)  Objective:  Lab Results  Component Value Date   HGBA1C 5.6 01/19/2019 .   Lab Results  Component Value Date   CREATININE 1.17 02/04/2019   CREATININE 1.33 (H) 02/03/2019   CREATININE 1.30 (H) 02/02/2019 .   [name] No results found for: EGFR  Current Barriers:  [name] Knowledge Deficits related to basic Diabetes pathophysiology and self care/management  Case Manager Clinical Goal(s):  Over the next 90 days, patient will demonstrate improved adherence to prescribed treatment plan for diabetes self care/management as evidenced by:  [name] Verbalize adherence to ADA/ carb modified diet within the next 90 days . Verbalize adherence to prescribed medication regimen within the next 90 days   Interventions:  . Reviewed medications with patient and discussed importance of medication adherence . Discussed plans with patient for  ongoing care management follow up and provided patient with direct contact information for care management team  Patient Self Care Activities:  . Self administers oral medications as prescribed . Adheres to prescribed ADA/carb modified  Per son, PCP advised for patient to take blood sugar 3-4 times weekly due to A1c of 5.6 and patient c/o sore fingers.         Plan: RN Health Coach will call patient within the month of October and patient's family agrees to future outreach calls.   Emelia Loron RN, BSN [place] [phone] [name]

## 2019-07-22 ENCOUNTER — Other Ambulatory Visit (HOSPITAL_COMMUNITY): Payer: Self-pay | Admitting: Internal Medicine

## 2019-07-22 ENCOUNTER — Encounter: Payer: Self-pay | Admitting: Physical Therapy

## 2019-07-22 ENCOUNTER — Other Ambulatory Visit: Payer: Self-pay

## 2019-07-22 ENCOUNTER — Ambulatory Visit (INDEPENDENT_AMBULATORY_CARE_PROVIDER_SITE_OTHER): Payer: Medicare Other | Admitting: Physical Therapy

## 2019-07-22 DIAGNOSIS — M25662 Stiffness of left knee, not elsewhere classified: Secondary | ICD-10-CM

## 2019-07-22 DIAGNOSIS — N644 Mastodynia: Secondary | ICD-10-CM

## 2019-07-22 DIAGNOSIS — R2689 Other abnormalities of gait and mobility: Secondary | ICD-10-CM | POA: Diagnosis not present

## 2019-07-22 DIAGNOSIS — R293 Abnormal posture: Secondary | ICD-10-CM | POA: Diagnosis not present

## 2019-07-22 DIAGNOSIS — M6281 Muscle weakness (generalized): Secondary | ICD-10-CM

## 2019-07-22 DIAGNOSIS — R2681 Unsteadiness on feet: Secondary | ICD-10-CM

## 2019-07-22 DIAGNOSIS — M6249 Contracture of muscle, multiple sites: Secondary | ICD-10-CM

## 2019-07-22 DIAGNOSIS — R3914 Feeling of incomplete bladder emptying: Secondary | ICD-10-CM | POA: Diagnosis not present

## 2019-07-22 DIAGNOSIS — M25661 Stiffness of right knee, not elsewhere classified: Secondary | ICD-10-CM

## 2019-07-22 NOTE — Therapy (Signed)
[facility] [address] [place], [place], [address] Phone: [phone]   Fax:  [phone]  Physical Therapy Treatment  Patient Details  Name: [name] MRN: [id number] Date of Birth: 07-19-1927 Referring Provider (PT): [name], MD   Encounter Date: 07/22/2019   PT End of Session - 07/22/19 1145    Visit Number 32    Number of Visits 91    Date for PT Re-Evaluation 06/30/19    Authorization Type [employer] Medicare    Authorization Time Period $30 co-pay    Progress Note Due on Visit 40    PT Start Time 1145    PT Stop Time 1233    PT Time Calculation (min) 48 min    Equipment Utilized During Treatment Gait belt    Activity Tolerance Patient tolerated treatment well    Behavior During Therapy WFL for tasks assessed/performed           Past Medical History:  Diagnosis Date  . Arthritis   . Borderline diabetic   . Diabetes mellitus without complication ([place])   . Glaucoma   . Hyperlipidemia   . Hypertension   . PVD (peripheral vascular disease) ([place])   . Sleep apnea    Cpap ordered but doesnt use  . Urinary retention due to benign prostatic hyperplasia 12/28/2018    Past Surgical History:  Procedure Laterality Date  . ABDOMINAL AORTOGRAM W/LOWER EXTREMITY N/A 07/27/2018   Procedure: ABDOMINAL AORTOGRAM W/LOWER EXTREMITY;  Surgeon: Lorretta Harp, MD;  Location: [place] [facility];  Service: Cardiovascular;  Laterality: N/A;  . AMPUTATION Right 08/28/2018   Procedure: RIGHT BELOW KNEE AMPUTATION;  Surgeon: [name], MD;  Location: [place];  Service: Orthopedics;  Laterality: Right;  . AMPUTATION Left 12/02/2018   Procedure: LEFT TRANSMETATARSAL AMPUTATION AND NEGATIVE PRESSURE WOUND VAC PLACEMENT;  Surgeon: [name], MD;  Location: [place];  Service: Orthopedics;  Laterality: Left;  . AMPUTATION Left 12/18/2018   Procedure: LEFT BELOW KNEE AMPUTATION;  Surgeon: [name], MD;  Location: [place];  Service: Orthopedics;   Laterality: Left;  . APPENDECTOMY  1943  . BELOW KNEE LEG AMPUTATION Left 12/18/2018  . CATARACT EXTRACTION  2012   x2  . COLONOSCOPY N/A 11/01/2014   Procedure: COLONOSCOPY;  Surgeon: Aviva Signs, MD;  [facility];  Service: Gastroenterology;  Laterality: N/A;  . ESOPHAGOGASTRODUODENOSCOPY N/A 11/01/2014   Procedure: ESOPHAGOGASTRODUODENOSCOPY (EGD);  Surgeon: Aviva Signs, MD;  [facility];  Service: Gastroenterology;  Laterality: N/A;  . HERNIA REPAIR Left   . INGUINAL HERNIA REPAIR Right 03/01/2013   Procedure: RIGHT INGUINAL HERNIORRHAPHY;  Surgeon: Jamesetta So, MD;  Location: [facility];  Service: General;  Laterality: Right;  . INSERTION OF MESH Right 03/01/2013   Procedure: INSERTION OF MESH;  Surgeon: Jamesetta So, MD;  Location: [facility];  Service: General;  Laterality: Right;  . LOWER EXTREMITY ANGIOGRAPHY Right 08/25/2018   Procedure: LOWER EXTREMITY ANGIOGRAPHY;  Surgeon: Wellington Hampshire, MD;  Location: [place] [facility];  Service: Cardiovascular;  Laterality: Right;  . LOWER EXTREMITY ANGIOGRAPHY N/A 11/25/2018   Procedure: LOWER EXTREMITY ANGIOGRAPHY;  Surgeon: [place] Hampshire, MD;  Location: [place] [facility];  Service: Cardiovascular;  Laterality: N/A;  . NM MYOCAR PERF WALL MOTION  06/05/2010   no significant ischemia  . PERIPHERAL VASCULAR ATHERECTOMY Right 08/03/2018   Procedure: PERIPHERAL VASCULAR ATHERECTOMY;  Surgeon: Lorretta Harp, MD;  Location: [place] [facility];  Service:  Cardiovascular;  Laterality: Right;  . PERIPHERAL VASCULAR ATHERECTOMY Left 11/23/2018   Procedure: PERIPHERAL VASCULAR ATHERECTOMY;  Surgeon: Lorretta Harp, MD;  Location: [place] [facility];  Service: Cardiovascular;  Laterality: Left;  sfa with dc balloon  . PERIPHERAL VASCULAR BALLOON ANGIOPLASTY Left 11/25/2018   Procedure: PERIPHERAL VASCULAR BALLOON ANGIOPLASTY;  Surgeon: Wellington Hampshire, MD;  Location: [place] [facility];  Service: Cardiovascular;   Laterality: Left;  popliteal  . PERIPHERAL VASCULAR INTERVENTION Left 11/25/2018   Procedure: PERIPHERAL VASCULAR INTERVENTION;  Surgeon: Wellington Hampshire, MD;  Location: [place] [facility];  Service: Cardiovascular;  Laterality: Left;  tibial peroneal trunk and peroneal stents   . stent  06/14/2010   left leg  . STUMP REVISION Left 01/20/2019   Procedure: REVISION LEFT BELOW KNEE AMPUTATION;  Surgeon: [name], MD;  Location: [place];  Service: Orthopedics;  Laterality: Left;  . US ECHOCARDIOGRAPHY  01/21/2006   moderate mitral annular ca+, mild MR, AOV moderately sclerotic.    There were no vitals filed for this visit.   Subjective Assessment - 07/22/19 1145    Subjective He had an episode of inability to form words for ~1 hour yesterday but clear now.    Patient is accompained by: Family member   son, [name]   Pertinent History Bil TTAs, DM2, PAD, HTN, CHF, A-Fib, TIA, CKDst3    Patient Stated Goals walk with prostheses in home & in/out house. Son would like him to be able to toilet & get in/out bed by himself.    Currently in Pain? No/denies    Pain Onset In the past 7 days                             [facility] Adult PT Treatment/Exercise - 07/22/19 1145      Transfers   Transfers Sit to Stand;Stand to Sit;Squat Pivot Transfers;Lateral/Scoot Transfers;Stand Pivot Transfers    Sit to Stand 3: Mod assist;With upper extremity assist;With armrests;From chair/3-in-1;Other (comment);2: Max assist   ModA pulling stabilized RW & MaxA pushing off w/c   Sit to Stand Details Manual facilitation for weight shifting;Verbal cues for safe use of DME/AE;Verbal cues for technique;Verbal cues for sequencing    Sit to Stand Details (indicate cue type and reason) PT instructed dtr in assisting her father with 2nd person stabilizing RW. She was able to return demo understanding.     Stand to Sit 4: Min assist;With upper extremity assist;With armrests;To chair/3-in-1;Other (comment)    from RW to w/c   Stand to Sit Details (indicate cue type and reason) Verbal cues for technique;Verbal cues for sequencing;Manual facilitation for weight shifting;Verbal cues for safe use of DME/AE    Stand to Sit Details PT instructed dtr in assisting her father with 2nd person stabilizing RW. She was able to return demo understanding.     Squat Pivot Transfers 3: Mod assist;With upper extremity assistance    Squat Pivot Transfer Details (indicate cue type and reason) verbal cues to perfrom level surface sliding board at home as much as possible for functional strengthening.     Comments --      Ambulation/Gait   Ambulation/Gait Yes    Ambulation/Gait Assistance 3: Mod assist;4: Min assist   modA initially/turns to minA once in motion on straight path   Ambulation/Gait Assistance Details PT instructed dtr in assisting her father with 2nd person stabilizing RW. She was able to return demo understanding with  PT contact assistance.     Ambulation Distance (Feet) 15 Feet   58' with PT, 60' X 2 with daughter assist / PT contact assis   Assistive device Prostheses;Rolling walker    Gait Pattern Step-to pattern;Decreased step length - right;Decreased step length - left;Decreased hip/knee flexion - right;Decreased hip/knee flexion - left;Right foot flat;Left foot flat;Right flexed knee in stance;Left flexed knee in stance;Shuffle;Lateral hip instability;Trunk flexed;Poor foot clearance - left;Poor foot clearance - right    Ambulation Surface Level;Indoor      Neuro Re-ed    Neuro Re-ed Details  --      Knee/Hip Exercises: Machines for Strengthening   Cybex Leg Press Shuttle Leg Press Back at 45* BLEs 87# Back flat BLEs 87# 10 reps 50# single LE 10 reps ea.       Prosthetics   Prosthetic Care Comments  --    Current prosthetic wear tolerance (days/week)  daily    Current prosthetic wear tolerance (#hours/day)  most of awake hours - up to 6hrs without shrinker under liners.     Edema pitting edema      Residual limb condition  no open areas, normal color & temperature,     Education Provided --                    PT Short Term Goals - 07/05/19 2047      PT SHORT TERM GOAL #1   Title Patient performs level surface direct contact scooting transfer with bil. prostheses with minA.  (All STGs Target Date: 07/30/2019)    Time 1    Period Months    Status On-going    Target Date 07/30/19      PT SHORT TERM GOAL #2   Title Patient tolerates prostheses wear daily >90% of awake hours without skin issues.    Time 1    Period Months    Status On-going    Target Date 07/30/19      PT SHORT TERM GOAL #3   Title Stand -pivot transfer with RW between w/c & chair with armrests with modA    Time 1    Period Months    Status On-going    Target Date 07/30/19      PT SHORT TERM GOAL #4   Title Standing balance with walker support static 2 minutes with supervision and reaches 10" anteriorly with minA    Time 1    Period Months    Status On-going    Target Date 07/30/19      PT SHORT TERM GOAL #5   Title Patient ambulates 20' & turns 90* to position to sit with RW & prostheses with modA.    Time 1    Period Weeks    Status On-going    Target Date 07/30/19             PT Long Term Goals - 07/05/19 2047      PT LONG TERM GOAL #1   Title Patient and family verbalize and demonstrate understanding of proper prosthetic care to enable safe utilization of prostheses. (All LTGs Target Date: 09/24/2019)    Time 3    Period Months    Status On-going    Target Date 09/24/19      PT LONG TERM GOAL #2   Title Patient tolerates wear of bilateral prostheses >90% of awake hours without skin issues or limb pain to enable functional potential during his day.    Time 3  Period Months    Status On-going    Target Date 09/24/19      PT LONG TERM GOAL #3   Title Squat pivot transfer bed to w/c or chair to w/c with prostheses with supervision.    Time 3    Period Months    Status  On-going    Target Date 09/24/19      PT LONG TERM GOAL #4   Title Sit to / from stand elevated w/c to walker with prostheses with minA    Time 3    Period Months    Status On-going    Target Date 09/24/19      PT LONG TERM GOAL #5   Title Standing balance with walker support with bilateral prostheses static stance for 2 minutes, scanning environment and reaches 2" anteriorly with supervision.    Time 3    Period Months    Status On-going    Target Date 09/24/19      PT LONG TERM GOAL #6   Title Patient ambulates 25' between 2 chairs including turning 90* to position to sit with RW & prostheses with minA from family safely.    Time 3    Period Months    Status On-going    Target Date 09/24/19                 Plan - 07/22/19 1145    Clinical Impression Statement PT instructed his daughter in technique to assist her father sit to/from stand pulling on stabilized RW (will need 2nd person at home) and prosthetic gait short distances.    Personal Factors and Comorbidities Age;Comorbidity 3+;Fitness;Time since onset of injury/illness/exacerbation    Comorbidities Bil TTAs, DM2, PAD, HTN, CHF, A-Fib, TIA, CKDst3,    Examination-Activity Limitations Locomotion Level;Sit;Stand;Toileting;Transfers    Examination-Participation Restrictions Community Activity    Stability/Clinical Decision Making Evolving/Moderate complexity    Rehab Potential Good    PT Frequency 2x / week    PT Duration 12 weeks   3 months   PT Treatment/Interventions ADLs/Self Care Home Management;DME Instruction;Gait training;Stair training;Functional mobility training;Therapeutic activities;Therapeutic exercise;Balance training;Neuromuscular re-education;Patient/family education;Prosthetic Training;Manual techniques;Passive range of motion;Vestibular    PT Next Visit Plan check updated STGs, continue instruction of family how to assist standing & gait.  balance activites seated on 24" stool at sink,  transfers  for stand pivot, Nustep, leg press, standing balance & gait with RW including turning 90* to position to sit, instruct family in technique to assist at home    Consulted and Agree with Plan of Care Patient;Family member/caregiver    Family Member Consulted son, Dammon Makarewicz           Patient will benefit from skilled therapeutic intervention in order to improve the following deficits and impairments:  Abnormal gait, Cardiopulmonary status limiting activity, Decreased activity tolerance, Decreased balance, Decreased endurance, Decreased knowledge of use of DME, Decreased mobility, Decreased range of motion, Decreased scar mobility, Decreased strength, Increased edema, Impaired flexibility, Impaired UE functional use, Postural dysfunction, Prosthetic Dependency, Pain  Visit Diagnosis: Muscle weakness (generalized)  Other abnormalities of gait and mobility  Unsteadiness on feet  Abnormal posture  Stiffness of right knee, not elsewhere classified  Stiffness of left knee, not elsewhere classified  Contracture of muscle, multiple sites     Problem List Patient Active Problem List   Diagnosis Date Noted  . Weakness generalized 04/05/2019  . S/P BKA (below knee amputation) unilateral, left ([place]) 02/15/2019  . Pressure injury of skin 02/03/2019  .  TIA (transient ischemic attack) 02/03/2019  . Transient speech disturbance 02/02/2019  . Chronic indwelling Foley catheter 01/22/2019  . Postoperative wound infection 01/20/2019  . Normocytic anemia 01/18/2019  . Cellulitis 01/18/2019  . Infection of deep incisional surgical site after procedure   . Abnormal urinalysis   . Labile blood glucose   . Urinary retention   . S/P bilateral BKA (below knee amputation) ([place])   . Acute blood loss anemia   . Urinary retention due to benign prostatic hyperplasia 12/28/2018  . Unilateral complete BKA, left, initial encounter ([place]) 12/25/2018  . Dehiscence of amputation stump ([place])   . History of  transmetatarsal amputation of left foot ([place]) 12/09/2018  . Critical limb ischemia with history of revascularization of same extremity   . Gangrene of left foot ([place])   . Diabetic ulcer of toe of left foot associated with type 2 diabetes mellitus ([place])   . Cellulitis of left lower extremity   . Toe infection 11/18/2018  . Peripheral edema   . Acute on chronic diastolic CHF (congestive heart failure) ([place]) 11/09/2018  . S/P BKA (below knee amputation) unilateral, right ([place]) 10/05/2018  . Hyponatremia   . Essential hypertension   . Postoperative pain   . Pain of left heel   . Unable to maintain weight-bearing   . Type 2 diabetes mellitus with diabetic peripheral angiopathy with gangrene ([place])   . Anemia due to acute blood loss   . Chronic kidney disease, stage 3a   . Acquired absence of right leg below knee ([place]) 09/07/2018  . Gangrene of right foot ([place])   . DNR (do not resuscitate) discussion   . Goals of care, counseling/discussion   . Palliative care by specialist   . Type 2 diabetes mellitus with vascular disease ([place])   . Severe protein-calorie malnutrition ([place])   . Ischemic foot 08/20/2018  . Critical lower limb ischemia 08/03/2018  . Type 2 diabetes mellitus with foot ulcer, without long-term current use of insulin ([place])   . Gastroesophageal reflux disease   . Cellulitis of great toe of right foot 05/29/2018  . Atrial fibrillation, chronic   . Chest pain 07/25/2017  . PAF (paroxysmal atrial fibrillation) ([place]) 07/25/2017  . BPH (benign prostatic hyperplasia) 07/25/2017  . Shortness of breath 05/11/2017  . Hypothyroidism 05/11/2017  . Chronic diastolic CHF (congestive heart failure) ([place]) 05/11/2017  . Moderate aortic stenosis 06/12/2016  . RBBB 06/12/2016  . Peripheral vascular disease ([place]) 08/04/2012  . Essential hypertension, benign 08/04/2012  . Hyperlipidemia 08/04/2012  . Dizziness 08/04/2012    Jamey Reas PT, DPT 07/22/2019, 5:37 PM  [facility] [address] [place], [place], [address] Phone: [phone]   Fax:  [phone]  Name: ARIN PERAL MRN: [id number] Date of Birth: 11-08-1927

## 2019-07-27 ENCOUNTER — Ambulatory Visit (INDEPENDENT_AMBULATORY_CARE_PROVIDER_SITE_OTHER): Payer: Medicare Other | Admitting: Physical Therapy

## 2019-07-27 ENCOUNTER — Encounter: Payer: Self-pay | Admitting: Physical Therapy

## 2019-07-27 ENCOUNTER — Other Ambulatory Visit: Payer: Self-pay

## 2019-07-27 DIAGNOSIS — M6281 Muscle weakness (generalized): Secondary | ICD-10-CM

## 2019-07-27 DIAGNOSIS — R2689 Other abnormalities of gait and mobility: Secondary | ICD-10-CM

## 2019-07-27 DIAGNOSIS — M25662 Stiffness of left knee, not elsewhere classified: Secondary | ICD-10-CM

## 2019-07-27 DIAGNOSIS — R2681 Unsteadiness on feet: Secondary | ICD-10-CM

## 2019-07-27 DIAGNOSIS — R600 Localized edema: Secondary | ICD-10-CM

## 2019-07-27 DIAGNOSIS — R293 Abnormal posture: Secondary | ICD-10-CM | POA: Diagnosis not present

## 2019-07-27 DIAGNOSIS — M6249 Contracture of muscle, multiple sites: Secondary | ICD-10-CM

## 2019-07-27 DIAGNOSIS — M25661 Stiffness of right knee, not elsewhere classified: Secondary | ICD-10-CM | POA: Diagnosis not present

## 2019-07-27 NOTE — Therapy (Signed)
[facility] [name] [place], [place], [address] Phone: [phone]   Fax:  [phone]  Physical Therapy Treatment  Patient Details  Name: [name] MRN: [id number] Date of Birth: January 24, 1927 Referring Provider (PT): [name], MD   Encounter Date: 07/27/2019   PT End of Session - 07/27/19 1414    Visit Number 33    Number of Visits 14    Date for PT Re-Evaluation 06/30/19    Authorization Type [employer] Medicare    Authorization Time Period $30 co-pay    Progress Note Due on Visit 58    PT Start Time 1145    PT Stop Time 1232    PT Time Calculation (min) 47 min    Equipment Utilized During Treatment Gait belt    Activity Tolerance Patient tolerated treatment well    Behavior During Therapy WFL for tasks assessed/performed           Past Medical History:  Diagnosis Date  . Arthritis   . Borderline diabetic   . Diabetes mellitus without complication ([place])   . Glaucoma   . Hyperlipidemia   . Hypertension   . PVD (peripheral vascular disease) ([place])   . Sleep apnea    Cpap ordered but doesnt use  . Urinary retention due to benign prostatic hyperplasia 12/28/2018    Past Surgical History:  Procedure Laterality Date  . ABDOMINAL AORTOGRAM W/LOWER EXTREMITY N/A 07/27/2018   Procedure: ABDOMINAL AORTOGRAM W/LOWER EXTREMITY;  Surgeon: Lorretta Harp, MD;  Location: [facility];  Service: Cardiovascular;  Laterality: N/A;  . AMPUTATION Right 08/28/2018   Procedure: RIGHT BELOW KNEE AMPUTATION;  Surgeon: [name], MD;  Location: [place];  Service: Orthopedics;  Laterality: Right;  . AMPUTATION Left 12/02/2018   Procedure: LEFT TRANSMETATARSAL AMPUTATION AND NEGATIVE PRESSURE WOUND VAC PLACEMENT;  Surgeon: [name], MD;  Location: [place];  Service: Orthopedics;  Laterality: Left;  . AMPUTATION Left 12/18/2018   Procedure: LEFT BELOW KNEE AMPUTATION;  Surgeon: [name], MD;  Location: [place];  Service:  Orthopedics;  Laterality: Left;  . APPENDECTOMY  1943  . BELOW KNEE LEG AMPUTATION Left 12/18/2018  . CATARACT EXTRACTION  2012   x2  . COLONOSCOPY N/A 11/01/2014   Procedure: COLONOSCOPY;  Surgeon: Aviva Signs, MD;  [facility];  Service: Gastroenterology;  Laterality: N/A;  . ESOPHAGOGASTRODUODENOSCOPY N/A 11/01/2014   Procedure: ESOPHAGOGASTRODUODENOSCOPY (EGD);  Surgeon: Aviva Signs, MD;  [facility];  Service: Gastroenterology;  Laterality: N/A;  . HERNIA REPAIR Left   . INGUINAL HERNIA REPAIR Right 03/01/2013   Procedure: RIGHT INGUINAL HERNIORRHAPHY;  Surgeon: Jamesetta So, MD;  Location: [facility];  Service: General;  Laterality: Right;  . INSERTION OF MESH Right 03/01/2013   Procedure: INSERTION OF MESH;  Surgeon: Jamesetta So, MD;  [facility];  Service: General;  Laterality: Right;  . LOWER EXTREMITY ANGIOGRAPHY Right 08/25/2018   Procedure: LOWER EXTREMITY ANGIOGRAPHY;  Surgeon: Wellington Hampshire, MD;  Location: [facility] [facility];  Service: Cardiovascular;  Laterality: Right;  . LOWER EXTREMITY ANGIOGRAPHY N/A 11/25/2018   Procedure: LOWER EXTREMITY ANGIOGRAPHY;  Surgeon: Wellington Hampshire, MD;  Location: [place] [facility];  Service: Cardiovascular;  Laterality: N/A;  . NM MYOCAR PERF WALL MOTION  06/05/2010   no significant ischemia  . PERIPHERAL VASCULAR ATHERECTOMY Right 08/03/2018   Procedure: PERIPHERAL VASCULAR ATHERECTOMY;  Surgeon: Lorretta Harp, MD;  Location: [facility];  Service:  Cardiovascular;  Laterality: Right;  . PERIPHERAL VASCULAR ATHERECTOMY Left 11/23/2018   Procedure: PERIPHERAL VASCULAR ATHERECTOMY;  Surgeon: Lorretta Harp, MD;  Location: [facility];  Service: Cardiovascular;  Laterality: Left;  sfa with dc balloon  . PERIPHERAL VASCULAR BALLOON ANGIOPLASTY Left 11/25/2018   Procedure: PERIPHERAL VASCULAR BALLOON ANGIOPLASTY;  Surgeon: Wellington Hampshire, MD;  Location: [facility];  Service:  Cardiovascular;  Laterality: Left;  popliteal  . PERIPHERAL VASCULAR INTERVENTION Left 11/25/2018   Procedure: PERIPHERAL VASCULAR INTERVENTION;  Surgeon: Wellington Hampshire, MD;  Location: [facility];  Service: Cardiovascular;  Laterality: Left;  tibial peroneal trunk and peroneal stents   . stent  06/14/2010   left leg  . STUMP REVISION Left 01/20/2019   Procedure: REVISION LEFT BELOW KNEE AMPUTATION;  Surgeon: [name], MD;  Location: [place] [facility];  Service: Orthopedics;  Laterality: Left;  . US ECHOCARDIOGRAPHY  01/21/2006   moderate mitral annular ca+, mild MR, AOV moderately sclerotic.    There were no vitals filed for this visit.   Subjective Assessment - 07/27/19 1145    Subjective Urologist says he emptied most of water injected during test and thought PT idea of condom catheter at night & urinal during day but wait until after procedure to shrink prostate in August.    Patient is accompained by: Family member   son, [name]   Pertinent History Bil TTAs, DM2, PAD, HTN, CHF, A-Fib, TIA, CKDst3    Patient Stated Goals walk with prostheses in home & in/out house. Son would like him to be able to toilet & get in/out bed by himself.    Currently in Pain? No/denies    Pain Onset In the past 7 days                             [facility] Adult PT Treatment/Exercise - 07/27/19 1145      Transfers   Transfers Sit to Stand;Stand to Sit;Squat Pivot Transfers;Lateral/Scoot Transfers;Stand Pivot Transfers    Sit to Stand 3: Mod assist;With upper extremity assist;With armrests;From chair/3-in-1;Other (comment);2: Max assist   ModA pulling stabilized RW & MaxA pushing off w/c   Sit to Stand Details Manual facilitation for weight shifting;Verbal cues for safe use of DME/AE;Verbal cues for technique;Verbal cues for sequencing    Stand to Sit 4: Min assist;With upper extremity assist;With armrests;To chair/3-in-1;Other (comment)   from RW to w/c   Stand to Sit Details  (indicate cue type and reason) Verbal cues for technique;Verbal cues for sequencing;Manual facilitation for weight shifting;Verbal cues for safe use of DME/AE    Squat Pivot Transfers 3: Mod assist;With upper extremity assistance      Ambulation/Gait   Ambulation/Gait Yes    Ambulation/Gait Assistance 3: Mod assist;4: Min assist   modA initially/turns to minA once in motion on straight path   Ambulation/Gait Assistance Details PT instructed dtr in assisting her father with 2nd person stabilizing RW. She was able to return demo understanding with PT contact assistance.     Ambulation Distance (Feet) 31 Feet   20' w/PT, 84' & 62' with daughter assist / PT contact assis   Assistive device Prostheses;Rolling walker    Gait Pattern Step-to pattern;Decreased step length - right;Decreased step length - left;Decreased hip/knee flexion - right;Decreased hip/knee flexion - left;Right foot flat;Left foot flat;Right flexed knee in stance;Left flexed knee in stance;Shuffle;Lateral hip instability;Trunk flexed;Poor foot clearance - left;Poor foot clearance -  right    Ambulation Surface Level;Indoor      Therapeutic Activites    Other Therapeutic Activities Sitting on 24" bar stool with PT contact guard: hitting golf balls 12 reps.       Knee/Hip Exercises: Machines for Strengthening   Cybex Leg Press Shuttle Leg Press Back at 45* BLEs 87# 15 reps Back flat BLEs 87# 15 reps      Prosthetics   Current prosthetic wear tolerance (days/week)  daily    Current prosthetic wear tolerance (#hours/day)  most of awake hours - up to 6hrs without shrinker under liners.     Edema pitting edema    Residual limb condition  no open areas, normal color & temperature,                     PT Short Term Goals - 07/27/19 1416      PT SHORT TERM GOAL #1   Title Patient performs level surface direct contact scooting transfer with bil. prostheses with minA.  (All STGs Target Date: 07/30/2019)    Time 1    Period  Months    Status Achieved    Target Date 07/30/19      PT SHORT TERM GOAL #2   Title Patient tolerates prostheses wear daily >90% of awake hours without skin issues.    Time 1    Period Months    Status Achieved    Target Date 07/30/19      PT SHORT TERM GOAL #3   Title Stand -pivot transfer with RW between w/c & chair with armrests with modA    Time 1    Period Months    Status On-going    Target Date 07/30/19      PT SHORT TERM GOAL #4   Title Standing balance with walker support static 2 minutes with supervision and reaches 10" anteriorly with minA    Time 1    Period Months    Status On-going    Target Date 07/30/19      PT SHORT TERM GOAL #5   Title Patient ambulates 20' & turns 90* to position to sit with RW & prostheses with modA.    Time 1    Period Weeks    Status Achieved    Target Date 07/30/19             PT Long Term Goals - 07/05/19 2047      PT LONG TERM GOAL #1   Title Patient and family verbalize and demonstrate understanding of proper prosthetic care to enable safe utilization of prostheses. (All LTGs Target Date: 09/24/2019)    Time 3    Period Months    Status On-going    Target Date 09/24/19      PT LONG TERM GOAL #2   Title Patient tolerates wear of bilateral prostheses >90% of awake hours without skin issues or limb pain to enable functional potential during his day.    Time 3    Period Months    Status On-going    Target Date 09/24/19      PT LONG TERM GOAL #3   Title Squat pivot transfer bed to w/c or chair to w/c with prostheses with supervision.    Time 3    Period Months    Status On-going    Target Date 09/24/19      PT LONG TERM GOAL #4   Title Sit to / from stand elevated w/c to walker with  prostheses with minA    Time 3    Period Months    Status On-going    Target Date 09/24/19      PT LONG TERM GOAL #5   Title Standing balance with walker support with bilateral prostheses static stance for 2 minutes, scanning  environment and reaches 2" anteriorly with supervision.    Time 3    Period Months    Status On-going    Target Date 09/24/19      PT LONG TERM GOAL #6   Title Patient ambulates 25' between 2 chairs including turning 90* to position to sit with RW & prostheses with minA from family safely.    Time 3    Period Months    Status On-going    Target Date 09/24/19                 Plan - 07/27/19 1414    Clinical Impression Statement His daughter improved her ability to assist with prosthetic gait with RW on straight path with second person assist.  Patient met 3 of STGs checked today and may meet other 2 next session.    Personal Factors and Comorbidities Age;Comorbidity 3+;Fitness;Time since onset of injury/illness/exacerbation    Comorbidities Bil TTAs, DM2, PAD, HTN, CHF, A-Fib, TIA, CKDst3,    Examination-Activity Limitations Locomotion Level;Sit;Stand;Toileting;Transfers    Examination-Participation Restrictions Community Activity    Stability/Clinical Decision Making Evolving/Moderate complexity    Rehab Potential Good    PT Frequency 2x / week    PT Duration 12 weeks   3 months   PT Treatment/Interventions ADLs/Self Care Home Management;DME Instruction;Gait training;Stair training;Functional mobility training;Therapeutic activities;Therapeutic exercise;Balance training;Neuromuscular re-education;Patient/family education;Prosthetic Training;Manual techniques;Passive range of motion;Vestibular    PT Next Visit Plan check updated STGs, continue instruction of family how to assist standing & gait.  balance activites seated on 24" stool at sink,  transfers for stand pivot, Nustep, leg press, standing balance & gait with RW including turning 90* to position to sit, instruct family in technique to assist at home    Consulted and Agree with Plan of Care Patient;Family member/caregiver    Family Member Consulted son, Dacoda Finlay           Patient will benefit from skilled therapeutic  intervention in order to improve the following deficits and impairments:  Abnormal gait, Cardiopulmonary status limiting activity, Decreased activity tolerance, Decreased balance, Decreased endurance, Decreased knowledge of use of DME, Decreased mobility, Decreased range of motion, Decreased scar mobility, Decreased strength, Increased edema, Impaired flexibility, Impaired UE functional use, Postural dysfunction, Prosthetic Dependency, Pain  Visit Diagnosis: Muscle weakness (generalized)  Other abnormalities of gait and mobility  Unsteadiness on feet  Abnormal posture  Stiffness of right knee, not elsewhere classified  Stiffness of left knee, not elsewhere classified  Contracture of muscle, multiple sites  Localized edema     Problem List Patient Active Problem List   Diagnosis Date Noted  . Weakness generalized 04/05/2019  . S/P BKA (below knee amputation) unilateral, left ([place]) 02/15/2019  . Pressure injury of skin 02/03/2019  . TIA (transient ischemic attack) 02/03/2019  . Transient speech disturbance 02/02/2019  . Chronic indwelling Foley catheter 01/22/2019  . Postoperative wound infection 01/20/2019  . Normocytic anemia 01/18/2019  . Cellulitis 01/18/2019  . Infection of deep incisional surgical site after procedure   . Abnormal urinalysis   . Labile blood glucose   . Urinary retention   . S/P bilateral BKA (below knee amputation) ([place])   . Acute  blood loss anemia   . Urinary retention due to benign prostatic hyperplasia 12/28/2018  . Unilateral complete BKA, left, initial encounter ([place]) 12/25/2018  . Dehiscence of amputation stump ([place])   . History of transmetatarsal amputation of left foot ([place]) 12/09/2018  . Critical limb ischemia with history of revascularization of same extremity   . Gangrene of left foot ([place])   . Diabetic ulcer of toe of left foot associated with type 2 diabetes mellitus ([place])   . Cellulitis of left lower extremity   . Toe infection  11/18/2018  . Peripheral edema   . Acute on chronic diastolic CHF (congestive heart failure) ([place]) 11/09/2018  . S/P BKA (below knee amputation) unilateral, right ([place]) 10/05/2018  . Hyponatremia   . Essential hypertension   . Postoperative pain   . Pain of left heel   . Unable to maintain weight-bearing   . Type 2 diabetes mellitus with diabetic peripheral angiopathy with gangrene ([place])   . Anemia due to acute blood loss   . Chronic kidney disease, stage 3a   . Acquired absence of right leg below knee ([place]) 09/07/2018  . Gangrene of right foot ([place])   . DNR (do not resuscitate) discussion   . Goals of care, counseling/discussion   . Palliative care by specialist   . Type 2 diabetes mellitus with vascular disease ([place])   . Severe protein-calorie malnutrition ([place])   . Ischemic foot 08/20/2018  . Critical lower limb ischemia 08/03/2018  . Type 2 diabetes mellitus with foot ulcer, without long-term current use of insulin ([place])   . Gastroesophageal reflux disease   . Cellulitis of great toe of right foot 05/29/2018  . Atrial fibrillation, chronic   . Chest pain 07/25/2017  . PAF (paroxysmal atrial fibrillation) ([place]) 07/25/2017  . BPH (benign prostatic hyperplasia) 07/25/2017  . Shortness of breath 05/11/2017  . Hypothyroidism 05/11/2017  . Chronic diastolic CHF (congestive heart failure) ([place]) 05/11/2017  . Moderate aortic stenosis 06/12/2016  . RBBB 06/12/2016  . Peripheral vascular disease ([place]) 08/04/2012  . Essential hypertension, benign 08/04/2012  . Hyperlipidemia 08/04/2012  . Dizziness 08/04/2012    Jamey Reas PT, DPT 07/27/2019, 11:33 PM  [facility] [address] [place], [place], [address] Phone: [phone]   Fax:  [phone]  Name: STATON MARKEY MRN: [id number] Date of Birth: 1927/03/21

## 2019-07-29 ENCOUNTER — Other Ambulatory Visit: Payer: Self-pay

## 2019-07-29 ENCOUNTER — Ambulatory Visit (INDEPENDENT_AMBULATORY_CARE_PROVIDER_SITE_OTHER): Payer: Medicare Other | Admitting: Physical Therapy

## 2019-07-29 ENCOUNTER — Encounter: Payer: Self-pay | Admitting: Physical Therapy

## 2019-07-29 DIAGNOSIS — R2689 Other abnormalities of gait and mobility: Secondary | ICD-10-CM

## 2019-07-29 DIAGNOSIS — M6281 Muscle weakness (generalized): Secondary | ICD-10-CM | POA: Diagnosis not present

## 2019-07-29 DIAGNOSIS — M25662 Stiffness of left knee, not elsewhere classified: Secondary | ICD-10-CM

## 2019-07-29 DIAGNOSIS — M25661 Stiffness of right knee, not elsewhere classified: Secondary | ICD-10-CM

## 2019-07-29 DIAGNOSIS — M6249 Contracture of muscle, multiple sites: Secondary | ICD-10-CM

## 2019-07-29 DIAGNOSIS — R293 Abnormal posture: Secondary | ICD-10-CM | POA: Diagnosis not present

## 2019-07-29 DIAGNOSIS — R2681 Unsteadiness on feet: Secondary | ICD-10-CM | POA: Diagnosis not present

## 2019-07-29 NOTE — Therapy (Signed)
[facility] [address] [place], [place], [address] Phone: [phone]   Fax:  [phone]  Physical Therapy Treatment  Patient Details  Name: [name] MRN: [id number] Date of Birth: December 13, 1927 Referring Provider (PT): [name], MD   Encounter Date: 07/29/2019   PT End of Session - 07/29/19 1816    Visit Number 34    Number of Visits 50    Date for PT Re-Evaluation 06/30/19    Authorization Type UHC Medicare    Authorization Time Period $30 co-pay    Progress Note Due on Visit 40    PT Start Time 1145    PT Stop Time 1231    PT Time Calculation (min) 46 min    Equipment Utilized During Treatment Gait belt    Activity Tolerance Patient tolerated treatment well    Behavior During Therapy WFL for tasks assessed/performed           Past Medical History:  Diagnosis Date  . Arthritis   . Borderline diabetic   . Diabetes mellitus without complication ([place])   . Glaucoma   . Hyperlipidemia   . Hypertension   . PVD (peripheral vascular disease) ([place])   . Sleep apnea    Cpap ordered but doesnt use  . Urinary retention due to benign prostatic hyperplasia 12/28/2018    Past Surgical History:  Procedure Laterality Date  . ABDOMINAL AORTOGRAM W/LOWER EXTREMITY N/A 07/27/2018   Procedure: ABDOMINAL AORTOGRAM W/LOWER EXTREMITY;  Surgeon: Lorretta Harp, MD;  Location: [place] [facility];  Service: Cardiovascular;  Laterality: N/A;  . AMPUTATION Right 08/28/2018   Procedure: RIGHT BELOW KNEE AMPUTATION;  Surgeon: [name], MD;  Location: [place];  Service: Orthopedics;  Laterality: Right;  . AMPUTATION Left 12/02/2018   Procedure: LEFT TRANSMETATARSAL AMPUTATION AND NEGATIVE PRESSURE WOUND VAC PLACEMENT;  Surgeon: [name], MD;  Location: [place];  Service: Orthopedics;  Laterality: Left;  . AMPUTATION Left 12/18/2018   Procedure: LEFT BELOW KNEE AMPUTATION;  Surgeon: [name], MD;  Location: [place];  Service:  Orthopedics;  Laterality: Left;  . APPENDECTOMY  1943  . BELOW KNEE LEG AMPUTATION Left 12/18/2018  . CATARACT EXTRACTION  2012   x2  . COLONOSCOPY N/A 11/01/2014   Procedure: COLONOSCOPY;  Surgeon: Aviva Signs, MD;  [facility];  Service: Gastroenterology;  Laterality: N/A;  . ESOPHAGOGASTRODUODENOSCOPY N/A 11/01/2014   Procedure: ESOPHAGOGASTRODUODENOSCOPY (EGD);  Surgeon: Aviva Signs, MD;  [facility];  Service: Gastroenterology;  Laterality: N/A;  . HERNIA REPAIR Left   . INGUINAL HERNIA REPAIR Right 03/01/2013   Procedure: RIGHT INGUINAL HERNIORRHAPHY;  Surgeon: Jamesetta So, MD;  Location: [facility];  Service: General;  Laterality: Right;  . INSERTION OF MESH Right 03/01/2013   Procedure: INSERTION OF MESH;  Surgeon: Jamesetta So, MD;  [facility];  Service: General;  Laterality: Right;  . LOWER EXTREMITY ANGIOGRAPHY Right 08/25/2018   Procedure: LOWER EXTREMITY ANGIOGRAPHY;  Surgeon: Wellington Hampshire, MD;  Location: [place] [facility];  Service: Cardiovascular;  Laterality: Right;  . LOWER EXTREMITY ANGIOGRAPHY N/A 11/25/2018   Procedure: LOWER EXTREMITY ANGIOGRAPHY;  Surgeon: Wellington Hampshire, MD;  Location: [place] [facility];  Service: Cardiovascular;  Laterality: N/A;  . NM MYOCAR PERF WALL MOTION  06/05/2010   no significant ischemia  . PERIPHERAL VASCULAR ATHERECTOMY Right 08/03/2018   Procedure: PERIPHERAL VASCULAR ATHERECTOMY;  Surgeon: Lorretta Harp, MD;  Location: [place] [facility];  Service:  Cardiovascular;  Laterality: Right;  . PERIPHERAL VASCULAR ATHERECTOMY Left 11/23/2018   Procedure: PERIPHERAL VASCULAR ATHERECTOMY;  Surgeon: Lorretta Harp, MD;  Location: [place] [facility];  Service: Cardiovascular;  Laterality: Left;  sfa with dc balloon  . PERIPHERAL VASCULAR BALLOON ANGIOPLASTY Left 11/25/2018   Procedure: PERIPHERAL VASCULAR BALLOON ANGIOPLASTY;  Surgeon: Wellington Hampshire, MD;  Location: [place] [facility];  Service:  Cardiovascular;  Laterality: Left;  popliteal  . PERIPHERAL VASCULAR INTERVENTION Left 11/25/2018   Procedure: PERIPHERAL VASCULAR INTERVENTION;  Surgeon: Wellington Hampshire, MD;  Location: [place] [facility];  Service: Cardiovascular;  Laterality: Left;  tibial peroneal trunk and peroneal stents   . stent  06/14/2010   left leg  . STUMP REVISION Left 01/20/2019   Procedure: REVISION LEFT BELOW KNEE AMPUTATION;  Surgeon: [name], MD;  Location[place];  Service: Orthopedics;  Laterality: Left;  . US ECHOCARDIOGRAPHY  01/21/2006   moderate mitral annular ca+, mild MR, AOV moderately sclerotic.    There were no vitals filed for this visit.   Subjective Assessment - 07/29/19 1145    Subjective He walked with daughter & son-law with RW straight path ~20' - 25'.    Patient is accompained by: Family member   son, [name]   Pertinent History Bil TTAs, DM2, PAD, HTN, CHF, A-Fib, TIA, CKDst3    Patient Stated Goals walk with prostheses in home & in/out house. Son would like him to be able to toilet & get in/out bed by himself.    Currently in Pain? No/denies    Pain Onset In the past 7 days              [facility] PT Assessment - 07/29/19 1145      Static Standing Balance   Static Standing - Balance Support Bilateral upper extremity supported   RW support   Static Standing - Level of Assistance 4: Min assist    Static Standing - Comment/# of Minutes 2 minutes      Dynamic Standing Balance   Dynamic Standing - Comments unable to reach anteriorly or release RW except to reach back to w/c to sit down                         [facility] Adult PT Treatment/Exercise - 07/29/19 1145      Transfers   Transfers Sit to Stand;Stand to Sit;Squat Pivot Transfers;Lateral/Scoot Transfers;Stand Pivot Transfers    Sit to Stand 3: Mod assist;With upper extremity assist;With armrests;From chair/3-in-1;Other (comment);4: Min assist;2: Max assist   ModA pulling stabilized RW & MaxA pushing off  w/c MinA sink   Sit to Stand Details Manual facilitation for weight shifting;Verbal cues for safe use of DME/AE;Verbal cues for technique;Verbal cues for sequencing    Stand to Sit 4: Min assist;With upper extremity assist;With armrests;To chair/3-in-1;Other (comment)   from RW to w/c   Stand to Sit Details (indicate cue type and reason) Verbal cues for technique;Verbal cues for sequencing;Manual facilitation for weight shifting;Verbal cues for safe use of DME/AE    Stand Pivot Transfers 2: Max assist   RW & prostheses   Squat Pivot Transfers 3: Mod assist;With upper extremity assistance    Lateral/Scoot Transfers 4: Min assist   level surfaces direct contact     Ambulation/Gait   Ambulation/Gait Yes    Ambulation/Gait Assistance 3: Mod assist;4: Min assist   modA initially/turns to minA once in motion on straight path  Ambulation/Gait Assistance Details PT instructed with verbal & tactile cues on daughter assisting including turning 90* to position to chair on right side & on left side.     Ambulation Distance (Feet) 20 Feet   20' X 2   Assistive device Prostheses;Rolling walker    Gait Pattern Step-to pattern;Decreased step length - right;Decreased step length - left;Decreased hip/knee flexion - right;Decreased hip/knee flexion - left;Right foot flat;Left foot flat;Right flexed knee in stance;Left flexed knee in stance;Shuffle;Lateral hip instability;Trunk flexed;Poor foot clearance - left;Poor foot clearance - right    Ambulation Surface Level;Indoor      Therapeutic Activites    Other Therapeutic Activities --      Knee/Hip Exercises: Stretches   Passive Hamstring Stretch Right;Left;2 reps;10 seconds    Hip Flexor Stretch Right;Left;2 reps;10 seconds   Thomas position     Knee/Hip Exercises: Field seismologist for Strengthening   Cybex Leg Press Shuttle Leg Press Back at 45* BLEs 87# 15 reps Back flat BLEs 87# 15 reps, single leg 50# 15 reps ea LE      Prosthetics   Current prosthetic wear  tolerance (days/week)  daily    Current prosthetic wear tolerance (#hours/day)  most of awake hours - up to 6hrs without shrinker under liners.     Edema pitting edema    Residual limb condition  no open areas, normal color & temperature,                     PT Short Term Goals - 07/29/19 1817      PT SHORT TERM GOAL #1   Title Patient performs level surface direct contact scooting transfer with bil. prostheses with minA.  (All STGs Target Date: 07/30/2019)    Baseline MET 07/29/2019    Time 1    Period Months    Status Achieved    Target Date 07/30/19      PT SHORT TERM GOAL #2   Title Patient tolerates prostheses wear daily >90% of awake hours without skin issues.    Baseline MEtT 07/29/2019    Time 1    Period Months    Status Achieved    Target Date 07/30/19      PT SHORT TERM GOAL #3   Title Stand -pivot transfer with RW between w/c & chair with armrests with modA    Baseline NOT MET 07/29/2019    Time 1    Period Months    Status Not Met    Target Date 07/30/19      PT SHORT TERM GOAL #4   Title Standing balance with walker support static 2 minutes with supervision and reaches 10" anteriorly with minA    Baseline PARTIALLY MET 07/29/2019  able stand static 2 minutes with supervsvion but unable to reach anteriorly with RW support    Time 1    Period Months    Status Partially Met    Target Date 07/30/19      PT SHORT TERM GOAL #5   Title Patient ambulates 20' & turns 90* to position to sit with RW & prostheses with modA.    Baseline MET 07/29/2019    Time 1    Period Weeks    Status Achieved    Target Date 07/30/19             PT Short Term Goals - 07/29/19 1830      PT SHORT TERM GOAL #1   Title Patient and family verbalize & demonstrate understanding of  updated HEP.  (All updated STGs Target Date: 08/26/2019)    Time 4    Period Weeks    Status New    Target Date 08/26/19      PT SHORT TERM GOAL #2   Title Patient ambulates 30' and turns 90* to  position to sit in chair with minA.    Time 4    Period Weeks    Status New    Target Date 08/26/19      PT SHORT TERM GOAL #3   Title Stand -pivot transfer with RW between w/c & chair with armrests with modA    Time 4    Period Weeks    Status On-going    Target Date 08/26/19      PT SHORT TERM GOAL #4   Title Standing balance with walker support reaches to counter anterior to RW with minA    Time 4    Period Weeks    Status Revised    Target Date 08/26/19             PT Long Term Goals - 07/05/19 2047      PT LONG TERM GOAL #1   Title Patient and family verbalize and demonstrate understanding of proper prosthetic care to enable safe utilization of prostheses. (All LTGs Target Date: 09/24/2019)    Time 3    Period Months    Status On-going    Target Date 09/24/19      PT LONG TERM GOAL #2   Title Patient tolerates wear of bilateral prostheses >90% of awake hours without skin issues or limb pain to enable functional potential during his day.    Time 3    Period Months    Status On-going    Target Date 09/24/19      PT LONG TERM GOAL #3   Title Squat pivot transfer bed to w/c or chair to w/c with prostheses with supervision.    Time 3    Period Months    Status On-going    Target Date 09/24/19      PT LONG TERM GOAL #4   Title Sit to / from stand elevated w/c to walker with prostheses with minA    Time 3    Period Months    Status On-going    Target Date 09/24/19      PT LONG TERM GOAL #5   Title Standing balance with walker support with bilateral prostheses static stance for 2 minutes, scanning environment and reaches 2" anteriorly with supervision.    Time 3    Period Months    Status On-going    Target Date 09/24/19      PT LONG TERM GOAL #6   Title Patient ambulates 25' between 2 chairs including turning 90* to position to sit with RW & prostheses with minA from family safely.    Time 3    Period Months    Status On-going    Target Date 09/24/19                   Plan - 07/29/19 1820    Clinical Impression Statement PT worked on training daughter to assist gait with 52* turns to chair to sit in order to progress needing only one person for gait.  PT continues to work on strengthening & flexibility. Patient progressed & met 4 of 5 STGs for this 30-day period.    Personal Factors and Comorbidities Age;Comorbidity 3+;Fitness;Time since onset of injury/illness/exacerbation  Comorbidities Bil TTAs, DM2, PAD, HTN, CHF, A-Fib, TIA, CKDst3,    Examination-Activity Limitations Locomotion Level;Sit;Stand;Toileting;Transfers    Examination-Participation Restrictions Community Activity    Stability/Clinical Decision Making Evolving/Moderate complexity    Rehab Potential Good    PT Frequency 2x / week    PT Duration 12 weeks   3 months   PT Treatment/Interventions ADLs/Self Care Home Management;DME Instruction;Gait training;Stair training;Functional mobility training;Therapeutic activities;Therapeutic exercise;Balance training;Neuromuscular re-education;Patient/family education;Prosthetic Training;Manual techniques;Passive range of motion;Vestibular    PT Next Visit Plan work towards updated STGs, continue instruction of family how to assist standing & gait including turning 90* to sit.  balance activites seated on 24" stool at sink,  transfers for stand pivot, Nustep, leg press, standing balance & gait with RW including turning 90* to position to sit, instruct family in technique to assist at home    Consulted and Agree with Plan of Care Patient;Family member/caregiver    Family Member Consulted dtr, [name]           Patient will benefit from skilled therapeutic intervention in order to improve the following deficits and impairments:  Abnormal gait, Cardiopulmonary status limiting activity, Decreased activity tolerance, Decreased balance, Decreased endurance, Decreased knowledge of use of DME, Decreased mobility, Decreased range of  motion, Decreased scar mobility, Decreased strength, Increased edema, Impaired flexibility, Impaired UE functional use, Postural dysfunction, Prosthetic Dependency, Pain  Visit Diagnosis: Muscle weakness (generalized)  Other abnormalities of gait and mobility  Unsteadiness on feet  Abnormal posture  Stiffness of right knee, not elsewhere classified  Stiffness of left knee, not elsewhere classified  Contracture of muscle, multiple sites     Problem List Patient Active Problem List   Diagnosis Date Noted  . Weakness generalized 04/05/2019  . S/P BKA (below knee amputation) unilateral, left ([place]) 02/15/2019  . Pressure injury of skin 02/03/2019  . TIA (transient ischemic attack) 02/03/2019  . Transient speech disturbance 02/02/2019  . Chronic indwelling Foley catheter 01/22/2019  . Postoperative wound infection 01/20/2019  . Normocytic anemia 01/18/2019  . Cellulitis 01/18/2019  . Infection of deep incisional surgical site after procedure   . Abnormal urinalysis   . Labile blood glucose   . Urinary retention   . S/P bilateral BKA (below knee amputation) ([place])   . Acute blood loss anemia   . Urinary retention due to benign prostatic hyperplasia 12/28/2018  . Unilateral complete BKA, left, initial encounter ([place]) 12/25/2018  . Dehiscence of amputation stump ([place])   . History of transmetatarsal amputation of left foot ([place]) 12/09/2018  . Critical limb ischemia with history of revascularization of same extremity   . Gangrene of left foot ([place])   . Diabetic ulcer of toe of left foot associated with type 2 diabetes mellitus ([place])   . Cellulitis of left lower extremity   . Toe infection 11/18/2018  . Peripheral edema   . Acute on chronic diastolic CHF (congestive heart failure) ([place]) 11/09/2018  . S/P BKA (below knee amputation) unilateral, right ([place]) 10/05/2018  . Hyponatremia   . Essential hypertension   . Postoperative pain   . Pain of left heel   . Unable to maintain  weight-bearing   . Type 2 diabetes mellitus with diabetic peripheral angiopathy with gangrene ([place])   . Anemia due to acute blood loss   . Chronic kidney disease, stage 3a   . Acquired absence of right leg below knee ([place]) 09/07/2018  . Gangrene of right foot ([place])   . DNR (do not resuscitate) discussion   .  Goals of care, counseling/discussion   . Palliative care by specialist   . Type 2 diabetes mellitus with vascular disease ([place])   . Severe protein-calorie malnutrition ([place])   . Ischemic foot 08/20/2018  . Critical lower limb ischemia 08/03/2018  . Type 2 diabetes mellitus with foot ulcer, without long-term current use of insulin ([place])   . Gastroesophageal reflux disease   . Cellulitis of great toe of right foot 05/29/2018  . Atrial fibrillation, chronic   . Chest pain 07/25/2017  . PAF (paroxysmal atrial fibrillation) ([place]) 07/25/2017  . BPH (benign prostatic hyperplasia) 07/25/2017  . Shortness of breath 05/11/2017  . Hypothyroidism 05/11/2017  . Chronic diastolic CHF (congestive heart failure) ([place]) 05/11/2017  . Moderate aortic stenosis 06/12/2016  . RBBB 06/12/2016  . Peripheral vascular disease ([place]) 08/04/2012  . Essential hypertension, benign 08/04/2012  . Hyperlipidemia 08/04/2012  . Dizziness 08/04/2012    Jamey Reas PT, DPT 07/29/2019, 6:30 PM  [facility] [address] [place], [place], [address] Phone: [phone]   Fax:  [phone]  Name: [name] MRN: [id number] Date of Birth: 1927/02/21

## 2019-07-30 ENCOUNTER — Telehealth: Payer: Self-pay | Admitting: Internal Medicine

## 2019-07-30 NOTE — Telephone Encounter (Signed)
   Primary Cardiologist: Pixie Casino, MD  Chart reviewed as part of pre-operative protocol coverage. Patient was contacted 07/30/2019 in reference to pre-operative risk assessment for pending surgery as outlined below.  [name] was last seen on 05/07/19 by Dr. Angelena Form for structural heart evaluation. He has history of paroxysmal atrial fibrillation on Eliquis, arthritis, DM, glaucoma, HTN, HLD, PAD s/p prior intervention then BKAs with recurrent issues due to critical limb ischemia, chronic diastolic CHF, sleep apnea, bradycardia, and moderately severe aortic stenosis. Has been following with Dr. Angelena Form more recently with ongoing f/u planned for August 2021 to re-evaluate his AS.  Clearance requested for procedure as below, Rezum (per review, in office minimally invasive prostate procedure).   Patient is noted to be on triple therapy with ASA, Plavix, Eliquis. Patient will need call, but will route to Dr. Debara Pickett for review of antiplatelets and also inquire about his and Dr. Camillia Herter input on whether nitrous oxide is considered safe given his underlying medical issues - - Please route response to [facility] PREOP (the pre-op pool). Thank you.  (So as not to confuse the issue, will plan on routing to pharm for input on Eliquis once the above is known.)  Charlie Pitter, PA-C 07/30/2019, 4:00 PM

## 2019-07-30 NOTE — Telephone Encounter (Signed)
   [facility] Pre-operative Risk Assessment    HEARTCARE STAFF: - Please ensure there is not already an duplicate clearance open for this procedure. - Under Visit Info/Reason for Call, type in Other and utilize the format Clearance MM/DD/YY or Clearance TBD. Do not use dashes or single digits. - If request is for dental extraction, please clarify the # of teeth to be extracted.  Request for surgical clearance:  1. What type of surgery is being performed? Rezum, in office procedure  2. When is this surgery scheduled? 08/23/2019  3. What type of clearance is required (medical clearance vs. Pharmacy clearance to hold med vs. Both)? both  4. Are there any medications that need to be held prior to surgery and how long? Hold eliquis 2-3 days prior, hold aspirin and plavix 5 days prior  5. Practice name and name of physician performing surgery? [facility], Dr. Festus Aloe  6. What is the office phone number? [phone]   7.   What is the office fax number? [phone]  8.   Anesthesia type (None, local, MAC, general) ? nitrous oxide   [name] 07/30/2019, 2:26 PM  _________________________________________________________________   (provider comments below)

## 2019-08-03 ENCOUNTER — Encounter: Payer: Self-pay | Admitting: Physical Therapy

## 2019-08-03 ENCOUNTER — Ambulatory Visit (INDEPENDENT_AMBULATORY_CARE_PROVIDER_SITE_OTHER): Payer: Medicare Other | Admitting: Physical Therapy

## 2019-08-03 ENCOUNTER — Other Ambulatory Visit: Payer: Self-pay

## 2019-08-03 DIAGNOSIS — R2689 Other abnormalities of gait and mobility: Secondary | ICD-10-CM

## 2019-08-03 DIAGNOSIS — R2681 Unsteadiness on feet: Secondary | ICD-10-CM

## 2019-08-03 DIAGNOSIS — M25661 Stiffness of right knee, not elsewhere classified: Secondary | ICD-10-CM | POA: Diagnosis not present

## 2019-08-03 DIAGNOSIS — R600 Localized edema: Secondary | ICD-10-CM

## 2019-08-03 DIAGNOSIS — M6281 Muscle weakness (generalized): Secondary | ICD-10-CM

## 2019-08-03 DIAGNOSIS — M25662 Stiffness of left knee, not elsewhere classified: Secondary | ICD-10-CM

## 2019-08-03 DIAGNOSIS — R293 Abnormal posture: Secondary | ICD-10-CM | POA: Diagnosis not present

## 2019-08-03 DIAGNOSIS — M6249 Contracture of muscle, multiple sites: Secondary | ICD-10-CM

## 2019-08-03 NOTE — Therapy (Signed)
[facility] Physical Therapy [address] [place], [place], [address] Phone: [phone]   Fax:  [phone]  Physical Therapy Treatment  Patient Details  Name: [name] MRN: [id number] Date of Birth: [birth year] Referring Provider (PT): [name], MD   Encounter Date: 08/03/2019   PT End of Session - 08/03/19 1645    Visit Number 35    Number of Visits 50    Date for PT Re-Evaluation 06/30/19    Authorization Type [employer] Medicare    Authorization Time Period $30 co-pay    Progress Note Due on Visit 40    PT Start Time 1145    PT Stop Time 1233    PT Time Calculation (min) 48 min    Equipment Utilized During Treatment Gait belt    Activity Tolerance Patient tolerated treatment well    Behavior During Therapy WFL for tasks assessed/performed           Past Medical History:  Diagnosis Date  . Arthritis   . Borderline diabetic   . Diabetes mellitus without complication ([place])   . Glaucoma   . Hyperlipidemia   . Hypertension   . PVD (peripheral vascular disease) ([place])   . Sleep apnea    Cpap ordered but doesnt use  . Urinary retention due to benign prostatic hyperplasia 12/28/2018    Past Surgical History:  Procedure Laterality Date  . ABDOMINAL AORTOGRAM W/LOWER EXTREMITY N/A 07/27/2018   Procedure: ABDOMINAL AORTOGRAM W/LOWER EXTREMITY;  Surgeon: Lorretta Harp, MD;  Location: [place] [facility];  Service: Cardiovascular;  Laterality: N/A;  . AMPUTATION Right 08/28/2018   Procedure: RIGHT BELOW KNEE AMPUTATION;  Surgeon: [name], MD;  Location: [place];  Service: Orthopedics;  Laterality: Right;  . AMPUTATION Left 12/02/2018   Procedure: LEFT TRANSMETATARSAL AMPUTATION AND NEGATIVE PRESSURE WOUND VAC PLACEMENT;  Surgeon: [name], MD;  Location: [place];  Service: Orthopedics;  Laterality: Left;  . AMPUTATION Left 12/18/2018   Procedure: LEFT BELOW KNEE AMPUTATION;  Surgeon: [name], MD;  Location: [place];  Service:  Orthopedics;  Laterality: Left;  . APPENDECTOMY  1943  . BELOW KNEE LEG AMPUTATION Left 12/18/2018  . CATARACT EXTRACTION  2012   x2  . COLONOSCOPY N/A 11/01/2014   Procedure: COLONOSCOPY;  Surgeon: Aviva Signs, MD;  [facility];  Service: Gastroenterology;  Laterality: N/A;  . ESOPHAGOGASTRODUODENOSCOPY N/A 11/01/2014   Procedure: ESOPHAGOGASTRODUODENOSCOPY (EGD);  Surgeon: Aviva Signs, MD;  [facility];  Service: Gastroenterology;  Laterality: N/A;  . HERNIA REPAIR Left   . INGUINAL HERNIA REPAIR Right 03/01/2013   Procedure: RIGHT INGUINAL HERNIORRHAPHY;  Surgeon: Jamesetta So, MD;  Location: [facility];  Service: General;  Laterality: Right;  . INSERTION OF MESH Right 03/01/2013   Procedure: INSERTION OF MESH;  Surgeon: Jamesetta So, MD;  [facility];  Service: General;  Laterality: Right;  . LOWER EXTREMITY ANGIOGRAPHY Right 08/25/2018   Procedure: LOWER EXTREMITY ANGIOGRAPHY;  Surgeon: Wellington Hampshire, MD;  Location: [place] [facility];  Service: Cardiovascular;  Laterality: Right;  . LOWER EXTREMITY ANGIOGRAPHY N/A 11/25/2018   Procedure: LOWER EXTREMITY ANGIOGRAPHY;  Surgeon: Wellington Hampshire, MD;  Location: [place] [facility];  Service: Cardiovascular;  Laterality: N/A;  . NM MYOCAR PERF WALL MOTION  06/05/2010   no significant ischemia  . PERIPHERAL VASCULAR ATHERECTOMY Right 08/03/2018   Procedure: PERIPHERAL VASCULAR ATHERECTOMY;  Surgeon: Lorretta Harp, MD;  Location: [place] [facility];  Service:  Cardiovascular;  Laterality: Right;  . PERIPHERAL VASCULAR ATHERECTOMY Left 11/23/2018   Procedure: PERIPHERAL VASCULAR ATHERECTOMY;  Surgeon: Lorretta Harp, MD;  Location: [place] [facility];  Service: Cardiovascular;  Laterality: Left;  sfa with dc balloon  . PERIPHERAL VASCULAR BALLOON ANGIOPLASTY Left 11/25/2018   Procedure: PERIPHERAL VASCULAR BALLOON ANGIOPLASTY;  Surgeon: Wellington Hampshire, MD;  Location: [place] [facility];  Service:  Cardiovascular;  Laterality: Left;  popliteal  . PERIPHERAL VASCULAR INTERVENTION Left 11/25/2018   Procedure: PERIPHERAL VASCULAR INTERVENTION;  Surgeon: Wellington Hampshire, MD;  Location: [place] [facility];  Service: Cardiovascular;  Laterality: Left;  tibial peroneal trunk and peroneal stents   . stent  06/14/2010   left leg  . STUMP REVISION Left 01/20/2019   Procedure: REVISION LEFT BELOW KNEE AMPUTATION;  Surgeon: [name], MD;  Location: [place];  Service: Orthopedics;  Laterality: Left;  . US ECHOCARDIOGRAPHY  01/21/2006   moderate mitral annular ca+, mild MR, AOV moderately sclerotic.    There were no vitals filed for this visit.   Subjective Assessment - 08/03/19 1145    Subjective He was able to get on golf cart for first time this weekend. His heart rate had 2 episode of heart rate spiking for 1-2 hours or more. It was corrected with medication.    Patient is accompained by: Family member   son, Lundon Verdejo   Pertinent History Bil TTAs, DM2, PAD, HTN, CHF, A-Fib, TIA, CKDst3    Patient Stated Goals walk with prostheses in home & in/out house. Son would like him to be able to toilet & get in/out bed by himself.    Currently in Pain? No/denies    Pain Onset In the past 7 days                             [facility] Adult PT Treatment/Exercise - 08/03/19 1145      Transfers   Transfers Sit to Stand;Stand to Sit;Squat Pivot Transfers;Lateral/Scoot Transfers;Stand Pivot Transfers    Sit to Stand 3: Mod assist;With upper extremity assist;With armrests;From chair/3-in-1;Other (comment);4: Min assist;2: Max assist   ModA pulling stabilized RW & MaxA pushing off w/c MinA sink   Sit to Stand Details Manual facilitation for weight shifting;Verbal cues for safe use of DME/AE;Verbal cues for technique;Verbal cues for sequencing    Stand to Sit 4: Min assist;With upper extremity assist;With armrests;To chair/3-in-1;Other (comment)   from RW to w/c   Stand to Sit Details  (indicate cue type and reason) Verbal cues for technique;Verbal cues for sequencing;Manual facilitation for weight shifting;Verbal cues for safe use of DME/AE    Stand Pivot Transfers 2: Max assist   RW & prostheses   Squat Pivot Transfers --    Lateral/Scoot Transfers 4: Min assist   level surfaces direct contact     Ambulation/Gait   Ambulation/Gait Yes    Ambulation/Gait Assistance 3: Mod assist;4: Min assist   modA initially/turns to minA once in motion on straight path   Ambulation/Gait Assistance Details PT instructed with verbal & tactile cues on daughter assisting including turning 90* to position to chair on right side & on left side.     Ambulation Distance (Feet) 20 Feet   20' X 2   Assistive device Prostheses;Rolling walker    Gait Pattern Step-to pattern;Decreased step length - right;Decreased step length - left;Decreased hip/knee flexion - right;Decreased hip/knee flexion - left;Right foot flat;Left foot flat;Right flexed  knee in stance;Left flexed knee in stance;Shuffle;Lateral hip instability;Trunk flexed;Poor foot clearance - left;Poor foot clearance - right    Ambulation Surface Level;Indoor    Pre-Gait Activities Sit to stand to sink then turning 90* to right 2x & to left 2x to sit down in second chair.       Knee/Hip Exercises: Stretches   Passive Hamstring Stretch --    Hip Flexor Stretch --      Knee/Hip Exercises: Aerobic   Nustep Level 6 BUEs & BLEs 12 min with verbal & manual cues on full ROM      Knee/Hip Exercises: Machines for Strengthening   Cybex Leg Press --      Prosthetics   Current prosthetic wear tolerance (days/week)  daily    Current prosthetic wear tolerance (#hours/day)  most of awake hours - up to 6hrs without shrinker under liners.     Edema pitting edema    Residual limb condition  no open areas, normal color & temperature,           HR at rest 84 after Nustep 109 after stand pivot at sink 111 after gait 131 with recovery to 108 within 1  minute PT monitored for 5 minutes at end of session with pulse oximeter and fluctuates from 80's up to 134 without activity and then as low as 53 for <10 seconds. No symptoms noted by patient. No SOB. SpO2 99-100% whole time           PT Short Term Goals - 08/03/19 1646      PT SHORT TERM GOAL #1   Title Patient and family verbalize & demonstrate understanding of updated HEP.  (All updated STGs Target Date: 08/26/2019)    Time 4    Period Weeks    Status On-going    Target Date 08/26/19      PT SHORT TERM GOAL #2   Title Patient ambulates 30' and turns 90* to position to sit in chair with minA.    Time 4    Period Weeks    Status On-going    Target Date 08/26/19      PT SHORT TERM GOAL #3   Title Stand -pivot transfer with RW between w/c & chair with armrests with modA    Time 4    Period Weeks    Status On-going    Target Date 08/26/19      PT SHORT TERM GOAL #4   Title Standing balance with walker support reaches to counter anterior to RW with minA    Time 4    Period Weeks    Status On-going    Target Date 08/26/19             PT Long Term Goals - 08/03/19 1646      PT LONG TERM GOAL #1   Title Patient and family verbalize and demonstrate understanding of proper prosthetic care to enable safe utilization of prostheses. (All LTGs Target Date: 09/24/2019)    Time 3    Period Months    Status On-going    Target Date 09/24/19      PT LONG TERM GOAL #2   Title Patient tolerates wear of bilateral prostheses >90% of awake hours without skin issues or limb pain to enable functional potential during his day.    Time 3    Period Months    Status On-going    Target Date 09/24/19      PT LONG TERM GOAL #3  Title Squat pivot transfer bed to w/c or chair to w/c with prostheses with supervision.    Time 3    Period Months    Status On-going    Target Date 09/24/19      PT LONG TERM GOAL #4   Title Sit to / from stand elevated w/c to walker with prostheses with  minA    Time 3    Period Months    Status On-going    Target Date 09/24/19      PT LONG TERM GOAL #5   Title Standing balance with walker support with bilateral prostheses static stance for 2 minutes, scanning environment and reaches 2" anteriorly with supervision.    Time 3    Period Months    Status On-going    Target Date 09/24/19      PT LONG TERM GOAL #6   Title Patient ambulates 25' between 2 chairs including turning 90* to position to sit with RW & prostheses with minA from family safely.    Time 3    Period Months    Status On-going    Target Date 09/24/19                 Plan - 08/03/19 1647    Clinical Impression Statement Patient's heart rate increased to 131bpm with gait today. PT monitored HR with pulse oximeter for 5 minutes at end of session and flucutated from 134bpm to 53 bpm.  PT worked on stand pivot 90* at sink to work on another activity for home.  Patient is beginning to be able to do things he wants with family like riding in his golf cart.    Personal Factors and Comorbidities Age;Comorbidity 3+;Fitness;Time since onset of injury/illness/exacerbation    Comorbidities Bil TTAs, DM2, PAD, HTN, CHF, A-Fib, TIA, CKDst3,    Examination-Activity Limitations Locomotion Level;Sit;Stand;Toileting;Transfers    Examination-Participation Restrictions Community Activity    Stability/Clinical Decision Making Evolving/Moderate complexity    Rehab Potential Good    PT Frequency 2x / week    PT Duration 12 weeks   3 months   PT Treatment/Interventions ADLs/Self Care Home Management;DME Instruction;Gait training;Stair training;Functional mobility training;Therapeutic activities;Therapeutic exercise;Balance training;Neuromuscular re-education;Patient/family education;Prosthetic Training;Manual techniques;Passive range of motion;Vestibular    PT Next Visit Plan monitor HR with activity,   work towards updated STGs, continue instruction of family how to assist standing &  gait including turning 90* to sit.  balance activites seated on 24" stool at sink,  transfers for stand pivot, Nustep, leg press, standing balance & gait with RW including turning 90* to position to sit, instruct family in technique to assist at home    Consulted and Agree with Plan of Care Patient;Family member/caregiver    Family Member Consulted dtr, [name]           Patient will benefit from skilled therapeutic intervention in order to improve the following deficits and impairments:  Abnormal gait, Cardiopulmonary status limiting activity, Decreased activity tolerance, Decreased balance, Decreased endurance, Decreased knowledge of use of DME, Decreased mobility, Decreased range of motion, Decreased scar mobility, Decreased strength, Increased edema, Impaired flexibility, Impaired UE functional use, Postural dysfunction, Prosthetic Dependency, Pain  Visit Diagnosis: Muscle weakness (generalized)  Other abnormalities of gait and mobility  Unsteadiness on feet  Abnormal posture  Stiffness of right knee, not elsewhere classified  Stiffness of left knee, not elsewhere classified  Contracture of muscle, multiple sites  Localized edema     Problem List Patient Active Problem List  Diagnosis Date Noted  . Weakness generalized 04/05/2019  . S/P BKA (below knee amputation) unilateral, left ([place]) 02/15/2019  . Pressure injury of skin 02/03/2019  . TIA (transient ischemic attack) 02/03/2019  . Transient speech disturbance 02/02/2019  . Chronic indwelling Foley catheter 01/22/2019  . Postoperative wound infection 01/20/2019  . Normocytic anemia 01/18/2019  . Cellulitis 01/18/2019  . Infection of deep incisional surgical site after procedure   . Abnormal urinalysis   . Labile blood glucose   . Urinary retention   . S/P bilateral BKA (below knee amputation) ([place])   . Acute blood loss anemia   . Urinary retention due to benign prostatic hyperplasia 12/28/2018  . Unilateral  complete BKA, left, initial encounter ([place]) 12/25/2018  . Dehiscence of amputation stump ([place])   . History of transmetatarsal amputation of left foot ([place]) 12/09/2018  . Critical limb ischemia with history of revascularization of same extremity   . Gangrene of left foot ([place])   . Diabetic ulcer of toe of left foot associated with type 2 diabetes mellitus ([place])   . Cellulitis of left lower extremity   . Toe infection 11/18/2018  . Peripheral edema   . Acute on chronic diastolic CHF (congestive heart failure) ([place]) 11/09/2018  . S/P BKA (below knee amputation) unilateral, right ([place]) 10/05/2018  . Hyponatremia   . Essential hypertension   . Postoperative pain   . Pain of left heel   . Unable to maintain weight-bearing   . Type 2 diabetes mellitus with diabetic peripheral angiopathy with gangrene ([place])   . Anemia due to acute blood loss   . Chronic kidney disease, stage 3a   . Acquired absence of right leg below knee ([place]) 09/07/2018  . Gangrene of right foot ([place])   . DNR (do not resuscitate) discussion   . Goals of care, counseling/discussion   . Palliative care by specialist   . Type 2 diabetes mellitus with vascular disease ([place])   . Severe protein-calorie malnutrition ([place])   . Ischemic foot 08/20/2018  . Critical lower limb ischemia 08/03/2018  . Type 2 diabetes mellitus with foot ulcer, without long-term current use of insulin ([place])   . Gastroesophageal reflux disease   . Cellulitis of great toe of right foot 05/29/2018  . Atrial fibrillation, chronic   . Chest pain 07/25/2017  . PAF (paroxysmal atrial fibrillation) ([place]) 07/25/2017  . BPH (benign prostatic hyperplasia) 07/25/2017  . Shortness of breath 05/11/2017  . Hypothyroidism 05/11/2017  . Chronic diastolic CHF (congestive heart failure) ([place]) 05/11/2017  . Moderate aortic stenosis 06/12/2016  . RBBB 06/12/2016  . Peripheral vascular disease ([place]) 08/04/2012  . Essential hypertension, benign 08/04/2012  .  Hyperlipidemia 08/04/2012  . Dizziness 08/04/2012    Jamey Reas PT, DPT 08/03/2019, 4:50 PM  [facility] Physical Therapy [address] [place], [place], [address] Phone: [phone]   Fax:  [phone]  Name: [name] MRN: [id number] Date of Birth: [birth year]

## 2019-08-03 NOTE — Telephone Encounter (Signed)
   Primary Cardiologist: Pixie Casino, MD  Chart reviewed as part of pre-operative protocol coverage. Given past medical history and time since last visit, based on ACC/AHA guidelines, JIVAN SYMANSKI would be at acceptable risk for the planned procedure without further cardiovascular testing.   Per Dr Debara Pickett patient will need to be monitored for hypoxia during NO anesthesia.   OK to hold Eliquis two days pre op and aspirin and Plavix 5-7 days pre op. Resume as soon as possible post op.  I spoke with the patient's daughter today and relayed these instructions.   I will route this recommendation to the requesting party via Epic fax function and remove from pre-op pool.  Please call with questions.  Kerin Ransom, PA-C 08/03/2019, 9:03 AM

## 2019-08-03 NOTE — Telephone Encounter (Signed)
Given his aortic stenosis and age, he is at high risk for any procedure - that being said, it is not likely prohibitive to do a procedure under nitrous oxide - NO does increase pulmonary vascular resistance - he likely has some pulmonary hypertension with his AS and may have some hypoxia, so that should be monitored, but thankfully, it is short-acting. Hold Eliquis 2 days prior and ASA/Plavix 5-7 days prior to procedure.  Dr. Debara Pickett

## 2019-08-04 DIAGNOSIS — R928 Other abnormal and inconclusive findings on diagnostic imaging of breast: Secondary | ICD-10-CM | POA: Diagnosis not present

## 2019-08-04 DIAGNOSIS — N644 Mastodynia: Secondary | ICD-10-CM | POA: Diagnosis not present

## 2019-08-05 ENCOUNTER — Encounter: Payer: Self-pay | Admitting: Physical Therapy

## 2019-08-05 ENCOUNTER — Other Ambulatory Visit: Payer: Self-pay

## 2019-08-05 ENCOUNTER — Ambulatory Visit (INDEPENDENT_AMBULATORY_CARE_PROVIDER_SITE_OTHER): Payer: Medicare Other | Admitting: Physical Therapy

## 2019-08-05 DIAGNOSIS — R293 Abnormal posture: Secondary | ICD-10-CM | POA: Diagnosis not present

## 2019-08-05 DIAGNOSIS — R2681 Unsteadiness on feet: Secondary | ICD-10-CM

## 2019-08-05 DIAGNOSIS — M6249 Contracture of muscle, multiple sites: Secondary | ICD-10-CM

## 2019-08-05 DIAGNOSIS — R600 Localized edema: Secondary | ICD-10-CM

## 2019-08-05 DIAGNOSIS — M6281 Muscle weakness (generalized): Secondary | ICD-10-CM | POA: Diagnosis not present

## 2019-08-05 DIAGNOSIS — R2689 Other abnormalities of gait and mobility: Secondary | ICD-10-CM

## 2019-08-05 DIAGNOSIS — Z9181 History of falling: Secondary | ICD-10-CM

## 2019-08-05 DIAGNOSIS — M25661 Stiffness of right knee, not elsewhere classified: Secondary | ICD-10-CM | POA: Diagnosis not present

## 2019-08-05 DIAGNOSIS — M25662 Stiffness of left knee, not elsewhere classified: Secondary | ICD-10-CM

## 2019-08-05 NOTE — Therapy (Signed)
[facility] Physical Therapy [address] [place], [place], [address] Phone: [phone]   Fax:  [phone]  Physical Therapy Treatment  Patient Details  Name: [name] MRN: [id number] Date of Birth: [birth year] Referring Provider (PT): Travis Score, MD   Encounter Date: 08/05/2019   PT End of Session - 08/05/19 2234    Visit Number 36    Number of Visits 50    Date for PT Re-Evaluation 09/26/19    Authorization Type [employer] Medicare    Authorization Time Period $30 co-pay    Progress Note Due on Visit 40    PT Start Time 1145    PT Stop Time 1230    PT Time Calculation (min) 45 min    Equipment Utilized During Treatment Gait belt    Activity Tolerance Patient tolerated treatment well    Behavior During Therapy WFL for tasks assessed/performed           Past Medical History:  Diagnosis Date  . Arthritis   . Borderline diabetic   . Diabetes mellitus without complication ([name])   . Glaucoma   . Hyperlipidemia   . Hypertension   . PVD (peripheral vascular disease) ([place])   . Sleep apnea    Cpap ordered but doesnt use  . Urinary retention due to benign prostatic hyperplasia 12/28/2018    Past Surgical History:  Procedure Laterality Date  . ABDOMINAL AORTOGRAM W/LOWER EXTREMITY N/A 07/27/2018   Procedure: ABDOMINAL AORTOGRAM W/LOWER EXTREMITY;  Surgeon: Travis Harp, MD;  Location: [facility] [facility];  Service: Cardiovascular;  Laterality: N/A;  . AMPUTATION Right 08/28/2018   Procedure: RIGHT BELOW KNEE AMPUTATION;  Surgeon: Travis Minion, MD;  Location: [facility];  Service: Orthopedics;  Laterality: Right;  . AMPUTATION Left 12/02/2018   Procedure: LEFT TRANSMETATARSAL AMPUTATION AND NEGATIVE PRESSURE WOUND VAC PLACEMENT;  Surgeon: Travis Minion, MD;  Location: [facility];  Service: Orthopedics;  Laterality: Left;  . AMPUTATION Left 12/18/2018   Procedure: LEFT BELOW KNEE AMPUTATION;  Surgeon: Travis Minion, MD;  Location: [facility];  Service:  Orthopedics;  Laterality: Left;  . APPENDECTOMY  1943  . BELOW KNEE LEG AMPUTATION Left 12/18/2018  . CATARACT EXTRACTION  2012   x2  . COLONOSCOPY N/A 11/01/2014   Procedure: COLONOSCOPY;  Surgeon: Travis Signs, MD;  [facility];  Service: Gastroenterology;  Laterality: N/A;  . ESOPHAGOGASTRODUODENOSCOPY N/A 11/01/2014   Procedure: ESOPHAGOGASTRODUODENOSCOPY (EGD);  Surgeon: Travis Signs, MD;  [facility];  Service: Gastroenterology;  Laterality: N/A;  . HERNIA REPAIR Left   . INGUINAL HERNIA REPAIR Right 03/01/2013   Procedure: RIGHT INGUINAL HERNIORRHAPHY;  Surgeon: Travis So, MD;  Location: [facility];  Service: General;  Laterality: Right;  . INSERTION OF MESH Right 03/01/2013   Procedure: INSERTION OF MESH;  Surgeon: Travis So, MD;  [facility];  Service: General;  Laterality: Right;  . LOWER EXTREMITY ANGIOGRAPHY Right 08/25/2018   Procedure: LOWER EXTREMITY ANGIOGRAPHY;  Surgeon: Travis Hampshire, MD;  Location: [facility];  Service: Cardiovascular;  Laterality: Right;  . LOWER EXTREMITY ANGIOGRAPHY N/A 11/25/2018   Procedure: LOWER EXTREMITY ANGIOGRAPHY;  Surgeon: Travis Hampshire, MD;  Location: [facility];  Service: Cardiovascular;  Laterality: N/A;  . NM MYOCAR PERF WALL MOTION  06/05/2010   no significant ischemia  . PERIPHERAL VASCULAR ATHERECTOMY Right 08/03/2018   Procedure: PERIPHERAL VASCULAR ATHERECTOMY;  Surgeon: Travis Harp, MD;  Location: [place] [facility];  Service:  Cardiovascular;  Laterality: Right;  . PERIPHERAL VASCULAR ATHERECTOMY Left 11/23/2018   Procedure: PERIPHERAL VASCULAR ATHERECTOMY;  Surgeon: Travis Harp, MD;  Location: [facility];  Service: Cardiovascular;  Laterality: Left;  sfa with dc balloon  . PERIPHERAL VASCULAR BALLOON ANGIOPLASTY Left 11/25/2018   Procedure: PERIPHERAL VASCULAR BALLOON ANGIOPLASTY;  Surgeon: Travis Hampshire, MD;  Location: [facility];  Service:  Cardiovascular;  Laterality: Left;  popliteal  . PERIPHERAL VASCULAR INTERVENTION Left 11/25/2018   Procedure: PERIPHERAL VASCULAR INTERVENTION;  Surgeon: Travis Hampshire, MD;  Location: [facility] [facility];  Service: Cardiovascular;  Laterality: Left;  tibial peroneal trunk and peroneal stents   . stent  06/14/2010   left leg  . STUMP REVISION Left 01/20/2019   Procedure: REVISION LEFT BELOW KNEE AMPUTATION;  Surgeon: Travis Minion, MD;  Location: [facility];  Service: Orthopedics;  Laterality: Left;  . US ECHOCARDIOGRAPHY  01/21/2006   moderate mitral annular ca+, mild MR, AOV moderately sclerotic.    There were no vitals filed for this visit.   Subjective Assessment - 08/05/19 1145    Subjective He had mammogram & [place] and found Gynecomastia which doctor feels from Prostate medication that will be changed.    Patient is accompained by: Family member   son, [name]   Pertinent History Bil TTAs, DM2, PAD, HTN, CHF, A-Fib, TIA, CKDst3    Patient Stated Goals walk with prostheses in home & in/out house. Son would like him to be able to toilet & get in/out bed by himself.    Currently in Pain? No/denies    Pain Onset In the past 7 days                             [facility] Adult PT Treatment/Exercise - 08/05/19 1145      Transfers   Transfers Sit to Stand;Stand to Sit;Squat Pivot Transfers;Lateral/Scoot Transfers;Stand Pivot Transfers    Sit to Stand 3: Mod assist;With upper extremity assist;With armrests;From chair/3-in-1;Other (comment);4: Min assist;2: Max assist   ModA pulling stabilized RW & MaxA pushing off w/c MinA sink   Sit to Stand Details Manual facilitation for weight shifting;Verbal cues for safe use of DME/AE;Verbal cues for technique;Verbal cues for sequencing    Stand to Sit 4: Min assist;With upper extremity assist;With armrests;To chair/3-in-1;Other (comment)   from RW to w/c   Stand to Sit Details (indicate cue type and reason) Verbal cues for technique;Verbal  cues for sequencing;Manual facilitation for weight shifting;Verbal cues for safe use of DME/AE    Stand Pivot Transfers 3: Mod assist   sink & chair back    Stand Pivot Transfer Details (indicate cue type and reason) sit to stand to sink then turning 90* to right & left to w/c using chair back for outer UE for support.  PT performed first transfer right & Left cueing pt & dtr in technique.  Dtr assisted with PT supervising right & left transfers.      Lateral/Scoot Transfers 4: Min assist   level surfaces direct contact     Knee/Hip Exercises: Aerobic   Nustep Level 6 BUEs & BLEs 10 min with verbal & manual cues on full ROM      Prosthetics   Current prosthetic wear tolerance (days/week)  daily    Current prosthetic wear tolerance (#hours/day)  most of awake hours - up to 6hrs without shrinker under liners.     Edema pitting edema  Residual limb condition  no open areas, normal color & temperature,                   PT Education - 08/05/19 1230    Education Details standing at sink and stand pivot transfer 90* right & left to w/c    Person(s) Educated Patient;Child(ren)    Methods Explanation;Demonstration;Tactile cues;Verbal cues    Comprehension Verbalized understanding;Returned demonstration;Verbal cues required;Tactile cues required;Need further instruction            PT Short Term Goals - 08/03/19 1646      PT SHORT TERM GOAL #1   Title Patient and family verbalize & demonstrate understanding of updated HEP.  (All updated STGs Target Date: 08/26/2019)    Time 4    Period Weeks    Status On-going    Target Date 08/26/19      PT SHORT TERM GOAL #2   Title Patient ambulates 30' and turns 90* to position to sit in chair with minA.    Time 4    Period Weeks    Status On-going    Target Date 08/26/19      PT SHORT TERM GOAL #3   Title Stand -pivot transfer with RW between w/c & chair with armrests with modA    Time 4    Period Weeks    Status On-going    Target  Date 08/26/19      PT SHORT TERM GOAL #4   Title Standing balance with walker support reaches to counter anterior to RW with minA    Time 4    Period Weeks    Status On-going    Target Date 08/26/19             PT Long Term Goals - 08/03/19 1646      PT LONG TERM GOAL #1   Title Patient and family verbalize and demonstrate understanding of proper prosthetic care to enable safe utilization of prostheses. (All LTGs Target Date: 09/24/2019)    Time 3    Period Months    Status On-going    Target Date 09/24/19      PT LONG TERM GOAL #2   Title Patient tolerates wear of bilateral prostheses >90% of awake hours without skin issues or limb pain to enable functional potential during his day.    Time 3    Period Months    Status On-going    Target Date 09/24/19      PT LONG TERM GOAL #3   Title Squat pivot transfer bed to w/c or chair to w/c with prostheses with supervision.    Time 3    Period Months    Status On-going    Target Date 09/24/19      PT LONG TERM GOAL #4   Title Sit to / from stand elevated w/c to walker with prostheses with minA    Time 3    Period Months    Status On-going    Target Date 09/24/19      PT LONG TERM GOAL #5   Title Standing balance with walker support with bilateral prostheses static stance for 2 minutes, scanning environment and reaches 2" anteriorly with supervision.    Time 3    Period Months    Status On-going    Target Date 09/24/19      PT LONG TERM GOAL #6   Title Patient ambulates 25' between 2 chairs including turning 90* to position to sit with RW &  prostheses with minA from family safely.    Time 3    Period Months    Status On-going    Target Date 09/24/19                 Plan - 08/05/19 2235    Clinical Impression Statement PT instructed pt's dtr in assisting with stand pivot transfers right & left to w/c and she was able to return demo safe assistance with her father.  She is able to assist with straight gait with  assistance of 2nd person to stabilize RW with standing up, supervision for safety and retrieving w/c when pt is fatigued. These should lead eventually to ability to ambulate then turn 90* to chair to sit with only 1 person assistance.    Personal Factors and Comorbidities Age;Comorbidity 3+;Fitness;Time since onset of injury/illness/exacerbation    Comorbidities Bil TTAs, DM2, PAD, HTN, CHF, A-Fib, TIA, CKDst3,    Examination-Activity Limitations Locomotion Level;Sit;Stand;Toileting;Transfers    Examination-Participation Restrictions Community Activity    Stability/Clinical Decision Making Evolving/Moderate complexity    Rehab Potential Good    PT Frequency 2x / week    PT Duration 12 weeks   3 months   PT Treatment/Interventions ADLs/Self Care Home Management;DME Instruction;Gait training;Stair training;Functional mobility training;Therapeutic activities;Therapeutic exercise;Balance training;Neuromuscular re-education;Patient/family education;Prosthetic Training;Manual techniques;Passive range of motion;Vestibular    PT Next Visit Plan monitor HR with activity,   work towards updated STGs, check how stand pivot transfers at sink are going. Therapuetic exercise    Consulted and Agree with Plan of Care Patient;Family member/caregiver    Family Member Consulted dtr, [name]           Patient will benefit from skilled therapeutic intervention in order to improve the following deficits and impairments:  Abnormal gait, Cardiopulmonary status limiting activity, Decreased activity tolerance, Decreased balance, Decreased endurance, Decreased knowledge of use of DME, Decreased mobility, Decreased range of motion, Decreased scar mobility, Decreased strength, Increased edema, Impaired flexibility, Impaired UE functional use, Postural dysfunction, Prosthetic Dependency, Pain  Visit Diagnosis: Muscle weakness (generalized)  Other abnormalities of gait and mobility  Unsteadiness on feet  Abnormal  posture  Stiffness of right knee, not elsewhere classified  Stiffness of left knee, not elsewhere classified  Contracture of muscle, multiple sites  Localized edema  History of fall     Problem List Patient Active Problem List   Diagnosis Date Noted  . Weakness generalized 04/05/2019  . S/P BKA (below knee amputation) unilateral, left ([place]) 02/15/2019  . Pressure injury of skin 02/03/2019  . TIA (transient ischemic attack) 02/03/2019  . Transient speech disturbance 02/02/2019  . Chronic indwelling Foley catheter 01/22/2019  . Postoperative wound infection 01/20/2019  . Normocytic anemia 01/18/2019  . Cellulitis 01/18/2019  . Infection of deep incisional surgical site after procedure   . Abnormal urinalysis   . Labile blood glucose   . Urinary retention   . S/P bilateral BKA (below knee amputation) ([place])   . Acute blood loss anemia   . Urinary retention due to benign prostatic hyperplasia 12/28/2018  . Unilateral complete BKA, left, initial encounter ([place]) 12/25/2018  . Dehiscence of amputation stump ([place])   . History of transmetatarsal amputation of left foot ([place]) 12/09/2018  . Critical limb ischemia with history of revascularization of same extremity   . Gangrene of left foot ([place])   . Diabetic ulcer of toe of left foot associated with type 2 diabetes mellitus ([place])   . Cellulitis of left lower extremity   .  Toe infection 11/18/2018  . Peripheral edema   . Acute on chronic diastolic CHF (congestive heart failure) ([place]) 11/09/2018  . S/P BKA (below knee amputation) unilateral, right ([place]) 10/05/2018  . Hyponatremia   . Essential hypertension   . Postoperative pain   . Pain of left heel   . Unable to maintain weight-bearing   . Type 2 diabetes mellitus with diabetic peripheral angiopathy with gangrene ([place])   . Anemia due to acute blood loss   . Chronic kidney disease, stage 3a   . Acquired absence of right leg below knee ([place]) 09/07/2018  . Gangrene of right  foot ([place])   . DNR (do not resuscitate) discussion   . Goals of care, counseling/discussion   . Palliative care by specialist   . Type 2 diabetes mellitus with vascular disease ([place])   . Severe protein-calorie malnutrition ([place])   . Ischemic foot 08/20/2018  . Critical lower limb ischemia 08/03/2018  . Type 2 diabetes mellitus with foot ulcer, without long-term current use of insulin ([place])   . Gastroesophageal reflux disease   . Cellulitis of great toe of right foot 05/29/2018  . Atrial fibrillation, chronic   . Chest pain 07/25/2017  . PAF (paroxysmal atrial fibrillation) ([place]) 07/25/2017  . BPH (benign prostatic hyperplasia) 07/25/2017  . Shortness of breath 05/11/2017  . Hypothyroidism 05/11/2017  . Chronic diastolic CHF (congestive heart failure) ([place]) 05/11/2017  . Moderate aortic stenosis 06/12/2016  . RBBB 06/12/2016  . Peripheral vascular disease ([place]) 08/04/2012  . Essential hypertension, benign 08/04/2012  . Hyperlipidemia 08/04/2012  . Dizziness 08/04/2012    Jamey Reas, PT, DPT 08/05/2019, 10:41 PM  [facility] Physical Therapy [address] [place], [place], [address] Phone: [phone]   Fax:  [phone]  Name: [name] MRN: [id number] Date of Birth: 25-Jul-1927

## 2019-08-09 ENCOUNTER — Other Ambulatory Visit: Payer: Self-pay | Admitting: *Deleted

## 2019-08-10 ENCOUNTER — Other Ambulatory Visit: Payer: Self-pay

## 2019-08-10 ENCOUNTER — Encounter: Payer: Self-pay | Admitting: Physical Therapy

## 2019-08-10 ENCOUNTER — Ambulatory Visit (INDEPENDENT_AMBULATORY_CARE_PROVIDER_SITE_OTHER): Payer: Medicare Other | Admitting: Physical Therapy

## 2019-08-10 DIAGNOSIS — M25662 Stiffness of left knee, not elsewhere classified: Secondary | ICD-10-CM

## 2019-08-10 DIAGNOSIS — M25661 Stiffness of right knee, not elsewhere classified: Secondary | ICD-10-CM

## 2019-08-10 DIAGNOSIS — R2681 Unsteadiness on feet: Secondary | ICD-10-CM

## 2019-08-10 DIAGNOSIS — R2689 Other abnormalities of gait and mobility: Secondary | ICD-10-CM | POA: Diagnosis not present

## 2019-08-10 DIAGNOSIS — M6281 Muscle weakness (generalized): Secondary | ICD-10-CM

## 2019-08-10 DIAGNOSIS — R293 Abnormal posture: Secondary | ICD-10-CM

## 2019-08-10 DIAGNOSIS — M6249 Contracture of muscle, multiple sites: Secondary | ICD-10-CM

## 2019-08-10 DIAGNOSIS — R600 Localized edema: Secondary | ICD-10-CM

## 2019-08-10 NOTE — Therapy (Signed)
[facility] [address] [place], [place], [address] Phone: [phone]   Fax:  [phone]  Physical Therapy Treatment  Patient Details  Name: [name] MRN: [id number] Date of Birth: 12-03-1927 Referring Provider (PT): [name], MD   Encounter Date: 08/10/2019   PT End of Session - 08/10/19 1243    Visit Number 37    Number of Visits 50    Date for PT Re-Evaluation 09/26/19    Authorization Type [employer] Medicare    Authorization Time Period $30 co-pay    Progress Note Due on Visit 40    PT Start Time 1145    PT Stop Time 1232    PT Time Calculation (min) 47 min    Equipment Utilized During Treatment Gait belt    Activity Tolerance Patient tolerated treatment well    Behavior During Therapy [facility] for tasks assessed/performed           Past Medical History:  Diagnosis Date  . Arthritis   . Borderline diabetic   . Diabetes mellitus without complication ([place])   . Glaucoma   . Hyperlipidemia   . Hypertension   . PVD (peripheral vascular disease) ([place])   . Sleep apnea    Cpap ordered but doesnt use  . Urinary retention due to benign prostatic hyperplasia 12/28/2018    Past Surgical History:  Procedure Laterality Date  . ABDOMINAL AORTOGRAM W/LOWER EXTREMITY N/A 07/27/2018   Procedure: ABDOMINAL AORTOGRAM W/LOWER EXTREMITY;  Surgeon: Lorretta Harp, MD;  Location: [place] [facility];  Service: Cardiovascular;  Laterality: N/A;  . AMPUTATION Right 08/28/2018   Procedure: RIGHT BELOW KNEE AMPUTATION;  Surgeon: [name], MD;  Location: [place];  Service: Orthopedics;  Laterality: Right;  . AMPUTATION Left 12/02/2018   Procedure: LEFT TRANSMETATARSAL AMPUTATION AND NEGATIVE PRESSURE WOUND VAC PLACEMENT;  Surgeon: [name], MD;  Location: [place];  Service: Orthopedics;  Laterality: Left;  . AMPUTATION Left 12/18/2018   Procedure: LEFT BELOW KNEE AMPUTATION;  Surgeon: [name], MD;  Location: [place];  Service:  Orthopedics;  Laterality: Left;  . APPENDECTOMY  1943  . BELOW KNEE LEG AMPUTATION Left 12/18/2018  . CATARACT EXTRACTION  2012   x2  . COLONOSCOPY N/A 11/01/2014   Procedure: COLONOSCOPY;  Surgeon: Aviva Signs, MD;  [facility];  Service: Gastroenterology;  Laterality: N/A;  . ESOPHAGOGASTRODUODENOSCOPY N/A 11/01/2014   Procedure: ESOPHAGOGASTRODUODENOSCOPY (EGD);  Surgeon: Aviva Signs, MD;  [facility];  Service: Gastroenterology;  Laterality: N/A;  . HERNIA REPAIR Left   . INGUINAL HERNIA REPAIR Right 03/01/2013   Procedure: RIGHT INGUINAL HERNIORRHAPHY;  Surgeon: Jamesetta So, MD;  Location: [facility];  Service: General;  Laterality: Right;  . INSERTION OF MESH Right 03/01/2013   Procedure: INSERTION OF MESH;  Surgeon: Jamesetta So, MD;  [facility];  Service: General;  Laterality: Right;  . LOWER EXTREMITY ANGIOGRAPHY Right 08/25/2018   Procedure: LOWER EXTREMITY ANGIOGRAPHY;  Surgeon: Wellington Hampshire, MD;  Location: [place] [facility];  Service: Cardiovascular;  Laterality: Right;  . LOWER EXTREMITY ANGIOGRAPHY N/A 11/25/2018   Procedure: LOWER EXTREMITY ANGIOGRAPHY;  Surgeon: Wellington Hampshire, MD;  Location: [place] [facility];  Service: Cardiovascular;  Laterality: N/A;  . NM MYOCAR PERF WALL MOTION  06/05/2010   no significant ischemia  . PERIPHERAL VASCULAR ATHERECTOMY Right 08/03/2018   Procedure: PERIPHERAL VASCULAR ATHERECTOMY;  Surgeon: Lorretta Harp, MD;  Location: [place] [facility];  Service:  Cardiovascular;  Laterality: Right;  . PERIPHERAL VASCULAR ATHERECTOMY Left 11/23/2018   Procedure: PERIPHERAL VASCULAR ATHERECTOMY;  Surgeon: Lorretta Harp, MD;  Location: [place] [facility];  Service: Cardiovascular;  Laterality: Left;  sfa with dc balloon  . PERIPHERAL VASCULAR BALLOON ANGIOPLASTY Left 11/25/2018   Procedure: PERIPHERAL VASCULAR BALLOON ANGIOPLASTY;  Surgeon: Wellington Hampshire, MD;  Location: [place] [facility];  Service:  Cardiovascular;  Laterality: Left;  popliteal  . PERIPHERAL VASCULAR INTERVENTION Left 11/25/2018   Procedure: PERIPHERAL VASCULAR INTERVENTION;  Surgeon: Wellington Hampshire, MD;  Location: [place] [facility];  Service: Cardiovascular;  Laterality: Left;  tibial peroneal trunk and peroneal stents   . stent  06/14/2010   left leg  . STUMP REVISION Left 01/20/2019   Procedure: REVISION LEFT BELOW KNEE AMPUTATION;  Surgeon: [name], MD;  Location: [place];  Service: Orthopedics;  Laterality: Left;  . US ECHOCARDIOGRAPHY  01/21/2006   moderate mitral annular ca+, mild MR, AOV moderately sclerotic.    There were no vitals filed for this visit.   Subjective Assessment - 08/10/19 1145    Subjective He has been using sliding board more than lift for transfers and actually easier    Patient is accompained by: Family member   son, Darrien Belter   Pertinent History Bil TTAs, DM2, PAD, HTN, CHF, A-Fib, TIA, CKDst3    Patient Stated Goals walk with prostheses in home & in/out house. Son would like him to be able to toilet & get in/out bed by himself.    Currently in Pain? No/denies    Pain Onset In the past 7 days                             [facility] Adult PT Treatment/Exercise - 08/10/19 1145      Transfers   Transfers Sit to Stand;Stand to Sit;Squat Pivot Transfers;Lateral/Scoot Transfers;Stand Pivot Transfers    Sit to Stand 3: Mod assist;With upper extremity assist;With armrests;From chair/3-in-1;Other (comment)   to RW   Sit to Stand Details Manual facilitation for weight shifting;Verbal cues for safe use of DME/AE;Verbal cues for technique;Verbal cues for sequencing    Stand to Sit 4: Min assist;With upper extremity assist;With armrests;To chair/3-in-1;Other (comment)   from RW to w/c   Stand to Sit Details (indicate cue type and reason) Verbal cues for technique;Verbal cues for sequencing;Manual facilitation for weight shifting;Verbal cues for safe use of DME/AE     Lateral/Scoot Transfers 4: Min assist;3: Mod assist   MinA level surfaces direct contact & modA 2" difference   Lateral/Scoot Transfer Details (indicate cue type and reason) verbal cues on weight shift & technique    Comments PT demo & verbal cues on lowering patient to floor if necessarily to prevent injury. Demo using sheet to aid transfer to 8" foot stool and then w/c to get off floor or his lift system if will go low enough for floor transfer.  Also can call local fire dept non-emergency number for assistance if no injuries. Dtr & pt verbalized understanding.       Ambulation/Gait   Ambulation/Gait Yes    Ambulation/Gait Assistance 3: Mod assist;4: Min assist   modA turns & minA straight path   Ambulation/Gait Assistance Details verbal & demo cues on turning technique to position in front of chair to sit and verbal cues on upright posture   daughter assisting under PT guidance   Ambulation Distance (Feet) 55 Feet  20' + 90* turn to sit & 55' straight path   Assistive device Prostheses;Rolling walker      Knee/Hip Exercises: Stretches   Active Hamstring Stretch Right;Left;2 reps;30 seconds    Active Hamstring Stretch Limitations leg press with LE extended on foot plate & pt assisting with stretch strap.  PT educated dtr & pt on using solid item like looped sheet not theraband for stretching. Pt & dtr verbalized understanding.       Knee/Hip Exercises: Machines for Strengthening   Cybex Leg Press Shuttle Leg Press Back at 45* BLEs 87# 15 reps Back flat BLEs 87# 15 reps, single leg 50# 15 reps ea LE      Prosthetics   Current prosthetic wear tolerance (days/week)  daily    Current prosthetic wear tolerance (#hours/day)  most of awake hours - up to 6hrs without shrinker under liners.     Edema pitting edema    Residual limb condition  no open areas, normal color & temperature,                     PT Short Term Goals - 08/03/19 1646      PT SHORT TERM GOAL #1   Title Patient  and family verbalize & demonstrate understanding of updated HEP.  (All updated STGs Target Date: 08/26/2019)    Time 4    Period Weeks    Status On-going    Target Date 08/26/19      PT SHORT TERM GOAL #2   Title Patient ambulates 30' and turns 90* to position to sit in chair with minA.    Time 4    Period Weeks    Status On-going    Target Date 08/26/19      PT SHORT TERM GOAL #3   Title Stand -pivot transfer with RW between w/c & chair with armrests with modA    Time 4    Period Weeks    Status On-going    Target Date 08/26/19      PT SHORT TERM GOAL #4   Title Standing balance with walker support reaches to counter anterior to RW with minA    Time 4    Period Weeks    Status On-going    Target Date 08/26/19             PT Long Term Goals - 08/03/19 1646      PT LONG TERM GOAL #1   Title Patient and family verbalize and demonstrate understanding of proper prosthetic care to enable safe utilization of prostheses. (All LTGs Target Date: 09/24/2019)    Time 3    Period Months    Status On-going    Target Date 09/24/19      PT LONG TERM GOAL #2   Title Patient tolerates wear of bilateral prostheses >90% of awake hours without skin issues or limb pain to enable functional potential during his day.    Time 3    Period Months    Status On-going    Target Date 09/24/19      PT LONG TERM GOAL #3   Title Squat pivot transfer bed to w/c or chair to w/c with prostheses with supervision.    Time 3    Period Months    Status On-going    Target Date 09/24/19      PT LONG TERM GOAL #4   Title Sit to / from stand elevated w/c to walker with prostheses with minA  Time 3    Period Months    Status On-going    Target Date 09/24/19      PT LONG TERM GOAL #5   Title Standing balance with walker support with bilateral prostheses static stance for 2 minutes, scanning environment and reaches 2" anteriorly with supervision.    Time 3    Period Months    Status On-going     Target Date 09/24/19      PT LONG TERM GOAL #6   Title Patient ambulates 25' between 2 chairs including turning 90* to position to sit with RW & prostheses with minA from family safely.    Time 3    Period Months    Status On-going    Target Date 09/24/19                 Plan - 08/10/19 1244    Clinical Impression Statement PT continue to educate daughter on parts of prosthetic gait that will eventually enable only one person to assist Mr. Wiechmann: sit to stand without RW being stabilized, gait towards target chair & turning 90* to position to sit.  He is slowly improving his ability which should enable family to assist safely with time.  PT also works on flexibility to improve LE extension & strength with leg press.    Personal Factors and Comorbidities Age;Comorbidity 3+;Fitness;Time since onset of injury/illness/exacerbation    Comorbidities Bil TTAs, DM2, PAD, HTN, CHF, A-Fib, TIA, CKDst3,    Examination-Activity Limitations Locomotion Level;Sit;Stand;Toileting;Transfers    Examination-Participation Restrictions Community Activity    Stability/Clinical Decision Making Evolving/Moderate complexity    Rehab Potential Good    PT Frequency 2x / week    PT Duration 12 weeks   3 months   PT Treatment/Interventions ADLs/Self Care Home Management;DME Instruction;Gait training;Stair training;Functional mobility training;Therapeutic activities;Therapeutic exercise;Balance training;Neuromuscular re-education;Patient/family education;Prosthetic Training;Manual techniques;Passive range of motion;Vestibular    PT Next Visit Plan monitor HR with activity,   work towards updated STGs, family education of sit/stand assist, gait & turning 90* to sit.  Therapuetic exercise    Consulted and Agree with Plan of Care Patient;Family member/caregiver    Family Member Consulted dtr, [name]           Patient will benefit from skilled therapeutic intervention in order to improve the following deficits  and impairments:  Abnormal gait, Cardiopulmonary status limiting activity, Decreased activity tolerance, Decreased balance, Decreased endurance, Decreased knowledge of use of DME, Decreased mobility, Decreased range of motion, Decreased scar mobility, Decreased strength, Increased edema, Impaired flexibility, Impaired UE functional use, Postural dysfunction, Prosthetic Dependency, Pain  Visit Diagnosis: Muscle weakness (generalized)  Other abnormalities of gait and mobility  Unsteadiness on feet  Abnormal posture  Stiffness of right knee, not elsewhere classified  Stiffness of left knee, not elsewhere classified  Contracture of muscle, multiple sites  Localized edema     Problem List Patient Active Problem List   Diagnosis Date Noted  . Weakness generalized 04/05/2019  . S/P BKA (below knee amputation) unilateral, left ([place]) 02/15/2019  . Pressure injury of skin 02/03/2019  . TIA (transient ischemic attack) 02/03/2019  . Transient speech disturbance 02/02/2019  . Chronic indwelling Foley catheter 01/22/2019  . Postoperative wound infection 01/20/2019  . Normocytic anemia 01/18/2019  . Cellulitis 01/18/2019  . Infection of deep incisional surgical site after procedure   . Abnormal urinalysis   . Labile blood glucose   . Urinary retention   . S/P bilateral BKA (below knee amputation) ([place])   .  Acute blood loss anemia   . Urinary retention due to benign prostatic hyperplasia 12/28/2018  . Unilateral complete BKA, left, initial encounter ([place]) 12/25/2018  . Dehiscence of amputation stump ([place])   . History of transmetatarsal amputation of left foot ([place]) 12/09/2018  . Critical limb ischemia with history of revascularization of same extremity   . Gangrene of left foot ([place])   . Diabetic ulcer of toe of left foot associated with type 2 diabetes mellitus ([place])   . Cellulitis of left lower extremity   . Toe infection 11/18/2018  . Peripheral edema   . Acute on chronic  diastolic CHF (congestive heart failure) ([place]) 11/09/2018  . S/P BKA (below knee amputation) unilateral, right ([place]) 10/05/2018  . Hyponatremia   . Essential hypertension   . Postoperative pain   . Pain of left heel   . Unable to maintain weight-bearing   . Type 2 diabetes mellitus with diabetic peripheral angiopathy with gangrene ([place])   . Anemia due to acute blood loss   . Chronic kidney disease, stage 3a   . Acquired absence of right leg below knee ([place]) 09/07/2018  . Gangrene of right foot ([place])   . DNR (do not resuscitate) discussion   . Goals of care, counseling/discussion   . Palliative care by specialist   . Type 2 diabetes mellitus with vascular disease ([place])   . Severe protein-calorie malnutrition ([place])   . Ischemic foot 08/20/2018  . Critical lower limb ischemia 08/03/2018  . Type 2 diabetes mellitus with foot ulcer, without long-term current use of insulin ([place])   . Gastroesophageal reflux disease   . Cellulitis of great toe of right foot 05/29/2018  . Atrial fibrillation, chronic   . Chest pain 07/25/2017  . PAF (paroxysmal atrial fibrillation) ([place]) 07/25/2017  . BPH (benign prostatic hyperplasia) 07/25/2017  . Shortness of breath 05/11/2017  . Hypothyroidism 05/11/2017  . Chronic diastolic CHF (congestive heart failure) ([place]) 05/11/2017  . Moderate aortic stenosis 06/12/2016  . RBBB 06/12/2016  . Peripheral vascular disease ([place]) 08/04/2012  . Essential hypertension, benign 08/04/2012  . Hyperlipidemia 08/04/2012  . Dizziness 08/04/2012    Jamey Reas PT, DPT 08/10/2019, 10:34 PM  [facility] [address] [place], [place], [address] Phone: [phone]   Fax:  [phone]  Name: [name] MRN: [id number] Date of Birth: 12/31/1927

## 2019-08-12 ENCOUNTER — Encounter: Payer: Self-pay | Admitting: Physical Therapy

## 2019-08-12 ENCOUNTER — Ambulatory Visit (INDEPENDENT_AMBULATORY_CARE_PROVIDER_SITE_OTHER): Payer: Medicare Other | Admitting: Physical Therapy

## 2019-08-12 ENCOUNTER — Other Ambulatory Visit: Payer: Self-pay

## 2019-08-12 DIAGNOSIS — R2689 Other abnormalities of gait and mobility: Secondary | ICD-10-CM

## 2019-08-12 DIAGNOSIS — M25661 Stiffness of right knee, not elsewhere classified: Secondary | ICD-10-CM | POA: Diagnosis not present

## 2019-08-12 DIAGNOSIS — R2681 Unsteadiness on feet: Secondary | ICD-10-CM | POA: Diagnosis not present

## 2019-08-12 DIAGNOSIS — R293 Abnormal posture: Secondary | ICD-10-CM

## 2019-08-12 DIAGNOSIS — M25662 Stiffness of left knee, not elsewhere classified: Secondary | ICD-10-CM | POA: Diagnosis not present

## 2019-08-12 DIAGNOSIS — R600 Localized edema: Secondary | ICD-10-CM

## 2019-08-12 DIAGNOSIS — M6281 Muscle weakness (generalized): Secondary | ICD-10-CM

## 2019-08-12 DIAGNOSIS — M6249 Contracture of muscle, multiple sites: Secondary | ICD-10-CM

## 2019-08-12 NOTE — Therapy (Signed)
[facility] Physical Therapy [address] [place], [place], [address] Phone: [phone]   Fax:  [phone]  Physical Therapy Treatment  Patient Details  Name: [name] MRN: [id number] Date of Birth: [birth year] Referring Provider (PT): [name], MD   Encounter Date: 08/12/2019   PT End of Session - 08/12/19 1145    Visit Number 38    Number of Visits 50    Date for PT Re-Evaluation 09/26/19    Authorization Type [employer] Medicare    Authorization Time Period $30 co-pay    Progress Note Due on Visit 40    PT Start Time 1145    PT Stop Time 1230    PT Time Calculation (min) 45 min    Equipment Utilized During Treatment Gait belt    Activity Tolerance Patient tolerated treatment well    Behavior During Therapy WFL for tasks assessed/performed           Past Medical History:  Diagnosis Date  . Arthritis   . Borderline diabetic   . Diabetes mellitus without complication ([place])   . Glaucoma   . Hyperlipidemia   . Hypertension   . PVD (peripheral vascular disease) ([place])   . Sleep apnea    Cpap ordered but doesnt use  . Urinary retention due to benign prostatic hyperplasia 12/28/2018    Past Surgical History:  Procedure Laterality Date  . ABDOMINAL AORTOGRAM W/LOWER EXTREMITY N/A 07/27/2018   Procedure: ABDOMINAL AORTOGRAM W/LOWER EXTREMITY;  Surgeon: Lorretta Harp, MD;  Location: [place] [facility];  Service: Cardiovascular;  Laterality: N/A;  . AMPUTATION Right 08/28/2018   Procedure: RIGHT BELOW KNEE AMPUTATION;  Surgeon: [name], MD;  Location: [place];  Service: Orthopedics;  Laterality: Right;  . AMPUTATION Left 12/02/2018   Procedure: LEFT TRANSMETATARSAL AMPUTATION AND NEGATIVE PRESSURE WOUND VAC PLACEMENT;  Surgeon: [name], MD;  Location: [place];  Service: Orthopedics;  Laterality: Left;  . AMPUTATION Left 12/18/2018   Procedure: LEFT BELOW KNEE AMPUTATION;  Surgeon: [name], MD;  Location: [place];  Service:  Orthopedics;  Laterality: Left;  . APPENDECTOMY  1943  . BELOW KNEE LEG AMPUTATION Left 12/18/2018  . CATARACT EXTRACTION  2012   x2  . COLONOSCOPY N/A 11/01/2014   Procedure: COLONOSCOPY;  Surgeon: Aviva Signs, MD;  [facility];  Service: Gastroenterology;  Laterality: N/A;  . ESOPHAGOGASTRODUODENOSCOPY N/A 11/01/2014   Procedure: ESOPHAGOGASTRODUODENOSCOPY (EGD);  Surgeon: Aviva Signs, MD;  [facility];  Service: Gastroenterology;  Laterality: N/A;  . HERNIA REPAIR Left   . INGUINAL HERNIA REPAIR Right 03/01/2013   Procedure: RIGHT INGUINAL HERNIORRHAPHY;  Surgeon: Jamesetta So, MD;  Location: [facility];  Service: General;  Laterality: Right;  . INSERTION OF MESH Right 03/01/2013   Procedure: INSERTION OF MESH;  Surgeon: Jamesetta So, MD;  Location[facility];  Service: General;  Laterality: Right;  . LOWER EXTREMITY ANGIOGRAPHY Right 08/25/2018   Procedure: LOWER EXTREMITY ANGIOGRAPHY;  Surgeon: Wellington Hampshire, MD;  Location: [place] [facility];  Service: Cardiovascular;  Laterality: Right;  . LOWER EXTREMITY ANGIOGRAPHY N/A 11/25/2018   Procedure: LOWER EXTREMITY ANGIOGRAPHY;  Surgeon: Wellington Hampshire, MD;  Location: [place] [facility];  Service: Cardiovascular;  Laterality: N/A;  . NM MYOCAR PERF WALL MOTION  06/05/2010   no significant ischemia  . PERIPHERAL VASCULAR ATHERECTOMY Right 08/03/2018   Procedure: PERIPHERAL VASCULAR ATHERECTOMY;  Surgeon: Lorretta Harp, MD;  Location: [place] [facility];  Service:  Cardiovascular;  Laterality: Right;  . PERIPHERAL VASCULAR ATHERECTOMY Left 11/23/2018   Procedure: PERIPHERAL VASCULAR ATHERECTOMY;  Surgeon: Lorretta Harp, MD;  Location: [place] [facility];  Service: Cardiovascular;  Laterality: Left;  sfa with dc balloon  . PERIPHERAL VASCULAR BALLOON ANGIOPLASTY Left 11/25/2018   Procedure: PERIPHERAL VASCULAR BALLOON ANGIOPLASTY;  Surgeon: Wellington Hampshire, MD;  Location: [place] [facility];  Service:  Cardiovascular;  Laterality: Left;  popliteal  . PERIPHERAL VASCULAR INTERVENTION Left 11/25/2018   Procedure: PERIPHERAL VASCULAR INTERVENTION;  Surgeon: Wellington Hampshire, MD;  Location: [place] [facility];  Service: Cardiovascular;  Laterality: Left;  tibial peroneal trunk and peroneal stents   . stent  06/14/2010   left leg  . STUMP REVISION Left 01/20/2019   Procedure: REVISION LEFT BELOW KNEE AMPUTATION;  Surgeon: [name], MD;  Location: [place];  Service: Orthopedics;  Laterality: Left;  . US ECHOCARDIOGRAPHY  01/21/2006   moderate mitral annular ca+, mild MR, AOV moderately sclerotic.    There were no vitals filed for this visit.   Subjective Assessment - 08/12/19 1145    Subjective His daughter continues to do exercises & activities recommended by PT at home.    Patient is accompained by: Family member   son, [name]   Pertinent History Bil TTAs, DM2, PAD, HTN, CHF, A-Fib, TIA, CKDst3    Patient Stated Goals walk with prostheses in home & in/out house. Son would like him to be able to toilet & get in/out bed by himself.    Currently in Pain? No/denies    Pain Onset In the past 7 days                             [facility] Adult PT Treatment/Exercise - 08/12/19 1145      Transfers   Transfers Sit to Stand;Stand to Sit;Squat Pivot Transfers;Lateral/Scoot Transfers;Stand Pivot Transfers    Sit to Stand 3: Mod assist;With upper extremity assist;With armrests;From chair/3-in-1;Other (comment)   to RW   Sit to Stand Details Manual facilitation for weight shifting;Verbal cues for safe use of DME/AE;Verbal cues for technique;Verbal cues for sequencing    Stand to Sit 4: Min assist;With upper extremity assist;With armrests;To chair/3-in-1;Other (comment)   from RW to w/c   Stand to Sit Details (indicate cue type and reason) Verbal cues for technique;Verbal cues for sequencing;Manual facilitation for weight shifting;Verbal cues for safe use of DME/AE    Stand Pivot  Transfers 3: Mod assist   RW    Lateral/Scoot Transfers --    Comments --      Ambulation/Gait   Ambulation/Gait Yes    Ambulation/Gait Assistance 3: Mod assist    Ambulation/Gait Assistance Details Patient's daughter assisting with PT supervising & contact assistance.  1st gait medium distance + 90* turn to sit and 2nd gait maximal tolerable distance.     Ambulation Distance (Feet) 60 Feet   30' + 90* turn to sit & 60' straight path   Assistive device Prostheses;Rolling walker    Gait Pattern Step-to pattern;Decreased step length - right;Decreased step length - left;Decreased hip/knee flexion - right;Decreased hip/knee flexion - left;Right foot flat;Left foot flat;Right flexed knee in stance;Left flexed knee in stance;Shuffle;Lateral hip instability;Trunk flexed;Poor foot clearance - left;Poor foot clearance - right    Ambulation Surface Level;Indoor    Gait Comments HR 65-78 resting, 88 - 92 after gait      Knee/Hip Exercises: Stretches   Active  Hamstring Stretch --    Active Hamstring Stretch Limitations --      Knee/Hip Exercises: Aerobic   Nustep Level 6 BUEs & BLEs 8 min with verbal & manual cues on full ROM      Knee/Hip Exercises: Machines for Strengthening   Cybex Leg Press --      Prosthetics   Prosthetic Care Comments  check distance from proximal patella to top of liner to note any slippage.  If liner slips then decreased control in standing & gait.     Current prosthetic wear tolerance (days/week)  daily    Current prosthetic wear tolerance (#hours/day)  most of awake hours - up to 6hrs without shrinker under liners.     Edema pitting edema    Residual limb condition  no open areas, normal color & temperature,     Education Provided Skin check;Proper Donning;Other (comment)   see prosthetic care comments   Person(s) Educated Patient;Child(ren)    Education Method Explanation;Demonstration;Verbal cues    Education Method Verbalized understanding;Verbal cues required;Needs  further instruction                    PT Short Term Goals - 08/03/19 1646      PT SHORT TERM GOAL #1   Title Patient and family verbalize & demonstrate understanding of updated HEP.  (All updated STGs Target Date: 08/26/2019)    Time 4    Period Weeks    Status On-going    Target Date 08/26/19      PT SHORT TERM GOAL #2   Title Patient ambulates 30' and turns 90* to position to sit in chair with minA.    Time 4    Period Weeks    Status On-going    Target Date 08/26/19      PT SHORT TERM GOAL #3   Title Stand -pivot transfer with RW between w/c & chair with armrests with modA    Time 4    Period Weeks    Status On-going    Target Date 08/26/19      PT SHORT TERM GOAL #4   Title Standing balance with walker support reaches to counter anterior to RW with minA    Time 4    Period Weeks    Status On-going    Target Date 08/26/19             PT Long Term Goals - 08/03/19 1646      PT LONG TERM GOAL #1   Title Patient and family verbalize and demonstrate understanding of proper prosthetic care to enable safe utilization of prostheses. (All LTGs Target Date: 09/24/2019)    Time 3    Period Months    Status On-going    Target Date 09/24/19      PT LONG TERM GOAL #2   Title Patient tolerates wear of bilateral prostheses >90% of awake hours without skin issues or limb pain to enable functional potential during his day.    Time 3    Period Months    Status On-going    Target Date 09/24/19      PT LONG TERM GOAL #3   Title Squat pivot transfer bed to w/c or chair to w/c with prostheses with supervision.    Time 3    Period Months    Status On-going    Target Date 09/24/19      PT LONG TERM GOAL #4   Title Sit to / from stand elevated  w/c to walker with prostheses with minA    Time 3    Period Months    Status On-going    Target Date 09/24/19      PT LONG TERM GOAL #5   Title Standing balance with walker support with bilateral prostheses static stance  for 2 minutes, scanning environment and reaches 2" anteriorly with supervision.    Time 3    Period Months    Status On-going    Target Date 09/24/19      PT LONG TERM GOAL #6   Title Patient ambulates 25' between 2 chairs including turning 90* to position to sit with RW & prostheses with minA from family safely.    Time 3    Period Months    Status On-going    Target Date 09/24/19                 Plan - 08/12/19 1145    Clinical Impression Statement PT continues to work on decreasing assistance for prosthetic gait & standing. His daughter is improving in her ability to assist patient with gait but requires 2nd person to stabilize RW, supervise & retrieve w/c and standing at sink working on 90* stand pivot transfers.    Personal Factors and Comorbidities Age;Comorbidity 3+;Fitness;Time since onset of injury/illness/exacerbation    Comorbidities Bil TTAs, DM2, PAD, HTN, CHF, A-Fib, TIA, CKDst3,    Examination-Activity Limitations Locomotion Level;Sit;Stand;Toileting;Transfers    Examination-Participation Restrictions Community Activity    Stability/Clinical Decision Making Evolving/Moderate complexity    Rehab Potential Good    PT Frequency 2x / week    PT Duration 12 weeks   3 months   PT Treatment/Interventions ADLs/Self Care Home Management;DME Instruction;Gait training;Stair training;Functional mobility training;Therapeutic activities;Therapeutic exercise;Balance training;Neuromuscular re-education;Patient/family education;Prosthetic Training;Manual techniques;Passive range of motion;Vestibular    PT Next Visit Plan monitor HR with activity,   work towards updated STGs, family education of sit/stand assist, gait & turning 90* to sit.  Therapuetic exercise    Consulted and Agree with Plan of Care Patient;Family member/caregiver    Family Member Consulted dtr, [name]           Patient will benefit from skilled therapeutic intervention in order to improve the following  deficits and impairments:  Abnormal gait, Cardiopulmonary status limiting activity, Decreased activity tolerance, Decreased balance, Decreased endurance, Decreased knowledge of use of DME, Decreased mobility, Decreased range of motion, Decreased scar mobility, Decreased strength, Increased edema, Impaired flexibility, Impaired UE functional use, Postural dysfunction, Prosthetic Dependency, Pain  Visit Diagnosis: Other abnormalities of gait and mobility  Unsteadiness on feet  Abnormal posture  Stiffness of right knee, not elsewhere classified  Stiffness of left knee, not elsewhere classified  Contracture of muscle, multiple sites  Localized edema  Muscle weakness (generalized)     Problem List Patient Active Problem List   Diagnosis Date Noted  . Weakness generalized 04/05/2019  . S/P BKA (below knee amputation) unilateral, left ([place]) 02/15/2019  . Pressure injury of skin 02/03/2019  . TIA (transient ischemic attack) 02/03/2019  . Transient speech disturbance 02/02/2019  . Chronic indwelling Foley catheter 01/22/2019  . Postoperative wound infection 01/20/2019  . Normocytic anemia 01/18/2019  . Cellulitis 01/18/2019  . Infection of deep incisional surgical site after procedure   . Abnormal urinalysis   . Labile blood glucose   . Urinary retention   . S/P bilateral BKA (below knee amputation) ([place])   . Acute blood loss anemia   . Urinary retention due to benign prostatic  hyperplasia 12/28/2018  . Unilateral complete BKA, left, initial encounter ([place]) 12/25/2018  . Dehiscence of amputation stump ([place])   . History of transmetatarsal amputation of left foot ([place]) 12/09/2018  . Critical limb ischemia with history of revascularization of same extremity   . Gangrene of left foot ([place])   . Diabetic ulcer of toe of left foot associated with type 2 diabetes mellitus ([place])   . Cellulitis of left lower extremity   . Toe infection 11/18/2018  . Peripheral edema   . Acute on  chronic diastolic CHF (congestive heart failure) ([place]) 11/09/2018  . S/P BKA (below knee amputation) unilateral, right ([place]) 10/05/2018  . Hyponatremia   . Essential hypertension   . Postoperative pain   . Pain of left heel   . Unable to maintain weight-bearing   . Type 2 diabetes mellitus with diabetic peripheral angiopathy with gangrene ([place])   . Anemia due to acute blood loss   . Chronic kidney disease, stage 3a   . Acquired absence of right leg below knee ([place]) 09/07/2018  . Gangrene of right foot ([place])   . DNR (do not resuscitate) discussion   . Goals of care, counseling/discussion   . Palliative care by specialist   . Type 2 diabetes mellitus with vascular disease ([place])   . Severe protein-calorie malnutrition ([place])   . Ischemic foot 08/20/2018  . Critical lower limb ischemia 08/03/2018  . Type 2 diabetes mellitus with foot ulcer, without long-term current use of insulin ([place])   . Gastroesophageal reflux disease   . Cellulitis of great toe of right foot 05/29/2018  . Atrial fibrillation, chronic   . Chest pain 07/25/2017  . PAF (paroxysmal atrial fibrillation) ([place]) 07/25/2017  . BPH (benign prostatic hyperplasia) 07/25/2017  . Shortness of breath 05/11/2017  . Hypothyroidism 05/11/2017  . Chronic diastolic CHF (congestive heart failure) ([place]) 05/11/2017  . Moderate aortic stenosis 06/12/2016  . RBBB 06/12/2016  . Peripheral vascular disease ([place]) 08/04/2012  . Essential hypertension, benign 08/04/2012  . Hyperlipidemia 08/04/2012  . Dizziness 08/04/2012    Jamey Reas, PT, DPT 08/12/2019, 7:24 PM  [facility] Physical Therapy [address] [place], [place], [address] Phone: [phone]   Fax:  [phone]  Name: TIELER COURNOYER MRN: [id number] Date of Birth: [birth year]

## 2019-08-13 DIAGNOSIS — E1122 Type 2 diabetes mellitus with diabetic chronic kidney disease: Secondary | ICD-10-CM | POA: Diagnosis not present

## 2019-08-13 DIAGNOSIS — I480 Paroxysmal atrial fibrillation: Secondary | ICD-10-CM | POA: Diagnosis not present

## 2019-08-13 DIAGNOSIS — I130 Hypertensive heart and chronic kidney disease with heart failure and stage 1 through stage 4 chronic kidney disease, or unspecified chronic kidney disease: Secondary | ICD-10-CM | POA: Diagnosis not present

## 2019-08-13 DIAGNOSIS — I5033 Acute on chronic diastolic (congestive) heart failure: Secondary | ICD-10-CM | POA: Diagnosis not present

## 2019-08-17 ENCOUNTER — Other Ambulatory Visit: Payer: Self-pay

## 2019-08-17 ENCOUNTER — Ambulatory Visit (INDEPENDENT_AMBULATORY_CARE_PROVIDER_SITE_OTHER): Payer: Medicare Other | Admitting: Physical Therapy

## 2019-08-17 DIAGNOSIS — R600 Localized edema: Secondary | ICD-10-CM

## 2019-08-17 DIAGNOSIS — R2681 Unsteadiness on feet: Secondary | ICD-10-CM

## 2019-08-17 DIAGNOSIS — R293 Abnormal posture: Secondary | ICD-10-CM

## 2019-08-17 DIAGNOSIS — M25662 Stiffness of left knee, not elsewhere classified: Secondary | ICD-10-CM

## 2019-08-17 DIAGNOSIS — M25661 Stiffness of right knee, not elsewhere classified: Secondary | ICD-10-CM

## 2019-08-17 DIAGNOSIS — R2689 Other abnormalities of gait and mobility: Secondary | ICD-10-CM

## 2019-08-17 DIAGNOSIS — M6281 Muscle weakness (generalized): Secondary | ICD-10-CM

## 2019-08-17 DIAGNOSIS — M6249 Contracture of muscle, multiple sites: Secondary | ICD-10-CM

## 2019-08-17 NOTE — Therapy (Signed)
[facility] [address] [place], [place], [address] Phone: [phone]   Fax:  [phone]  Physical Therapy Treatment  Patient Details  Name: [name] MRN: [id number] Date of Birth: 27-Feb-1927 Referring Provider (PT): [name], MD   Encounter Date: 08/17/2019   PT End of Session - 08/17/19 1144    Visit Number 39    Number of Visits 82    Date for PT Re-Evaluation 09/26/19    Authorization Type [employer] Medicare    Authorization Time Period $30 co-pay    Progress Note Due on Visit 39    PT Start Time 1100    PT Stop Time 1147    PT Time Calculation (min) 47 min    Equipment Utilized During Treatment Gait belt    Activity Tolerance Patient tolerated treatment well    Behavior During Therapy WFL for tasks assessed/performed           Past Medical History:  Diagnosis Date   Arthritis    Borderline diabetic    Diabetes mellitus without complication ([place])    Glaucoma    Hyperlipidemia    Hypertension    PVD (peripheral vascular disease) ([place])    Sleep apnea    Cpap ordered but doesnt use   Urinary retention due to benign prostatic hyperplasia 12/28/2018    Past Surgical History:  Procedure Laterality Date   ABDOMINAL AORTOGRAM W/LOWER EXTREMITY N/A 07/27/2018   Procedure: ABDOMINAL AORTOGRAM W/LOWER EXTREMITY;  Surgeon: Lorretta Harp, MD;  Location: [facility];  Service: Cardiovascular;  Laterality: N/A;   AMPUTATION Right 08/28/2018   Procedure: RIGHT BELOW KNEE AMPUTATION;  Surgeon: [name], MD;  Location: [facility];  Service: Orthopedics;  Laterality: Right;   AMPUTATION Left 12/02/2018   Procedure: LEFT TRANSMETATARSAL AMPUTATION AND NEGATIVE PRESSURE WOUND VAC PLACEMENT;  Surgeon: [name], MD;  Location: [facility];  Service: Orthopedics;  Laterality: Left;   AMPUTATION Left 12/18/2018   Procedure: LEFT BELOW KNEE AMPUTATION;  Surgeon: [name], MD;  Location: [facility];  Service: Orthopedics;   Laterality: Left;   APPENDECTOMY  1943   BELOW KNEE LEG AMPUTATION Left 12/18/2018   CATARACT EXTRACTION  2012   x2   COLONOSCOPY N/A 11/01/2014   Procedure: COLONOSCOPY;  Surgeon: Aviva Signs, MD;  [facility];  Service: Gastroenterology;  Laterality: N/A;   ESOPHAGOGASTRODUODENOSCOPY N/A 11/01/2014   Procedure: ESOPHAGOGASTRODUODENOSCOPY (EGD);  Surgeon: Aviva Signs, MD;  [facility];  Service: Gastroenterology;  Laterality: N/A;   HERNIA REPAIR Left    INGUINAL HERNIA REPAIR Right 03/01/2013   Procedure: RIGHT INGUINAL HERNIORRHAPHY;  Surgeon: Jamesetta So, MD;  Location: [facility];  Service: General;  Laterality: Right;   INSERTION OF MESH Right 03/01/2013   Procedure: INSERTION OF MESH;  Surgeon: Jamesetta So, MD;  Location: [facility];  Service: General;  Laterality: Right;   LOWER EXTREMITY ANGIOGRAPHY Right 08/25/2018   Procedure: LOWER EXTREMITY ANGIOGRAPHY;  Surgeon: Wellington Hampshire, MD;  Location: [facility];  Service: Cardiovascular;  Laterality: Right;   LOWER EXTREMITY ANGIOGRAPHY N/A 11/25/2018   Procedure: LOWER EXTREMITY ANGIOGRAPHY;  Surgeon: Wellington Hampshire, MD;  Location: [facility];  Service: Cardiovascular;  Laterality: N/A;   NM MYOCAR PERF WALL MOTION  06/05/2010   no significant ischemia   PERIPHERAL VASCULAR ATHERECTOMY Right 08/03/2018   Procedure: PERIPHERAL VASCULAR ATHERECTOMY;  Surgeon: Lorretta Harp, MD;  Location: [facility];  Service:  Cardiovascular;  Laterality: Right;   PERIPHERAL VASCULAR ATHERECTOMY Left 11/23/2018   Procedure: PERIPHERAL VASCULAR ATHERECTOMY;  Surgeon: Lorretta Harp, MD;  Location: [facility];  Service: Cardiovascular;  Laterality: Left;  sfa with dc balloon   PERIPHERAL VASCULAR BALLOON ANGIOPLASTY Left 11/25/2018   Procedure: PERIPHERAL VASCULAR BALLOON ANGIOPLASTY;  Surgeon: Wellington Hampshire, MD;  Location: [facility];  Service: Cardiovascular;   Laterality: Left;  popliteal   PERIPHERAL VASCULAR INTERVENTION Left 11/25/2018   Procedure: PERIPHERAL VASCULAR INTERVENTION;  Surgeon: Wellington Hampshire, MD;  Location: [facility];  Service: Cardiovascular;  Laterality: Left;  tibial peroneal trunk and peroneal stents    stent  06/14/2010   left leg   STUMP REVISION Left 01/20/2019   Procedure: REVISION LEFT BELOW KNEE AMPUTATION;  Surgeon: [name], MD;  Location: [facility];  Service: Orthopedics;  Laterality: Left;   US ECHOCARDIOGRAPHY  01/21/2006   moderate mitral annular ca+, mild MR, AOV moderately sclerotic.    There were no vitals filed for this visit.                      [facility] Adult PT Treatment/Exercise - 08/17/19 0001      Transfers   Transfers Sit to Stand;Stand to Sit;Squat Pivot Transfers;Lateral/Scoot Transfers;Stand Pivot Transfers    Sit to Stand 3: Mod assist;With upper extremity assist;With armrests;From chair/3-in-1;Other (comment)    Sit to Stand Details Manual facilitation for weight shifting;Verbal cues for safe use of DME/AE;Verbal cues for technique;Verbal cues for sequencing    Stand to Sit 4: Min assist;With upper extremity assist;With armrests;To chair/3-in-1;Other (comment)    Stand to Sit Details (indicate cue type and reason) Verbal cues for technique;Verbal cues for sequencing;Manual facilitation for weight shifting;Verbal cues for safe use of DME/AE    Stand Pivot Transfers 3: Mod assist      Ambulation/Gait   Ambulation/Gait Yes    Ambulation/Gait Assistance 3: Mod assist    Ambulation/Gait Assistance Details Patient's daughter assisting with PT supervising & contact assistance.  1st 2 attempts at max tolerable distance 45 ft and 51 ft. 2nd and 3rd attempts were medium distance 30 feet + 90* turn to sit    Ambulation Distance (Feet) --   45, 50, 30, 30   Assistive device Prostheses;Rolling walker    Gait Pattern Step-to pattern;Decreased step length - right;Decreased step  length - left;Decreased hip/knee flexion - right;Decreased hip/knee flexion - left;Right foot flat;Left foot flat;Right flexed knee in stance;Left flexed knee in stance;Shuffle;Lateral hip instability;Trunk flexed;Poor foot clearance - left;Poor foot clearance - right    Ambulation Surface Level;Indoor      Knee/Hip Exercises: Aerobic   Nustep Level 6 BUEs & BLEs 6 min      Knee/Hip Exercises: Standing   Other Standing Knee Exercises standing balance at sink working on reducing UE support from both hands, to one, hands to no hands. Then with one hand holding on, reaching into cabinet to place water bottle. Then without UE support bilat fwd reaching to paper towel dispenser for A-P balance challenge    Other Standing Knee Exercises mini squats ct counter with bilat UE support X 10 reps                    PT Short Term Goals - 08/03/19 1646      PT SHORT TERM GOAL #1   Title Patient and family verbalize & demonstrate understanding of updated HEP.  (All  updated STGs Target Date: 08/26/2019)    Time 4    Period Weeks    Status On-going    Target Date 08/26/19      PT SHORT TERM GOAL #2   Title Patient ambulates 30' and turns 90* to position to sit in chair with minA.    Time 4    Period Weeks    Status On-going    Target Date 08/26/19      PT SHORT TERM GOAL #3   Title Stand -pivot transfer with RW between w/c & chair with armrests with modA    Time 4    Period Weeks    Status On-going    Target Date 08/26/19      PT SHORT TERM GOAL #4   Title Standing balance with walker support reaches to counter anterior to RW with minA    Time 4    Period Weeks    Status On-going    Target Date 08/26/19             PT Long Term Goals - 08/03/19 1646      PT LONG TERM GOAL #1   Title Patient and family verbalize and demonstrate understanding of proper prosthetic care to enable safe utilization of prostheses. (All LTGs Target Date: 09/24/2019)    Time 3    Period Months     Status On-going    Target Date 09/24/19      PT LONG TERM GOAL #2   Title Patient tolerates wear of bilateral prostheses >90% of awake hours without skin issues or limb pain to enable functional potential during his day.    Time 3    Period Months    Status On-going    Target Date 09/24/19      PT LONG TERM GOAL #3   Title Squat pivot transfer bed to w/c or chair to w/c with prostheses with supervision.    Time 3    Period Months    Status On-going    Target Date 09/24/19      PT LONG TERM GOAL #4   Title Sit to / from stand elevated w/c to walker with prostheses with minA    Time 3    Period Months    Status On-going    Target Date 09/24/19      PT LONG TERM GOAL #5   Title Standing balance with walker support with bilateral prostheses static stance for 2 minutes, scanning environment and reaches 2" anteriorly with supervision.    Time 3    Period Months    Status On-going    Target Date 09/24/19      PT LONG TERM GOAL #6   Title Patient ambulates 25' between 2 chairs including turning 90* to position to sit with RW & prostheses with minA from family safely.    Time 3    Period Months    Status On-going    Target Date 09/24/19                 Plan - 08/17/19 1146    Clinical Impression Statement Session focused on therapeutic activities for transfers and gait along with exercises for balance and endurance. He continues to have difficulty with turning 90 deg or backing up. PT will continue to work on this as able. He will need progress note next visit.    Personal Factors and Comorbidities Age;Comorbidity 3+;Fitness;Time since onset of injury/illness/exacerbation    Comorbidities Bil TTAs, DM2, PAD, HTN, CHF,  A-Fib, TIA, CKDst3,    Examination-Activity Limitations Locomotion Level;Sit;Stand;Toileting;Transfers    Examination-Participation Restrictions Community Activity    Stability/Clinical Decision Making Evolving/Moderate complexity    Rehab Potential Good     PT Frequency 2x / week    PT Duration 12 weeks   3 months   PT Treatment/Interventions ADLs/Self Care Home Management;DME Instruction;Gait training;Stair training;Functional mobility training;Therapeutic activities;Therapeutic exercise;Balance training;Neuromuscular re-education;Patient/family education;Prosthetic Training;Manual techniques;Passive range of motion;Vestibular    PT Next Visit Plan needs 10th visit progress note. monitor HR with activity,   work towards updated STGs, family education of sit/stand assist, gait & turning 90* to sit.  Therapuetic exercise    Consulted and Agree with Plan of Care Patient;Family member/caregiver    Family Member Consulted dtr, [name]           Patient will benefit from skilled therapeutic intervention in order to improve the following deficits and impairments:  Abnormal gait, Cardiopulmonary status limiting activity, Decreased activity tolerance, Decreased balance, Decreased endurance, Decreased knowledge of use of DME, Decreased mobility, Decreased range of motion, Decreased scar mobility, Decreased strength, Increased edema, Impaired flexibility, Impaired UE functional use, Postural dysfunction, Prosthetic Dependency, Pain  Visit Diagnosis: Other abnormalities of gait and mobility  Unsteadiness on feet  Abnormal posture  Stiffness of right knee, not elsewhere classified  Stiffness of left knee, not elsewhere classified  Contracture of muscle, multiple sites  Localized edema  Muscle weakness (generalized)     Problem List Patient Active Problem List   Diagnosis Date Noted   Weakness generalized 04/05/2019   S/P BKA (below knee amputation) unilateral, left ([place]) 02/15/2019   Pressure injury of skin 02/03/2019   TIA (transient ischemic attack) 02/03/2019   Transient speech disturbance 02/02/2019   Chronic indwelling Foley catheter 01/22/2019   Postoperative wound infection 01/20/2019   Normocytic anemia 01/18/2019    Cellulitis 01/18/2019   Infection of deep incisional surgical site after procedure    Abnormal urinalysis    Labile blood glucose    Urinary retention    S/P bilateral BKA (below knee amputation) ([facility])    Acute blood loss anemia    Urinary retention due to benign prostatic hyperplasia 12/28/2018   Unilateral complete BKA, left, initial encounter ([place]) 12/25/2018   Dehiscence of amputation stump ([place])    History of transmetatarsal amputation of left foot ([place]) 12/09/2018   Critical limb ischemia with history of revascularization of same extremity    Gangrene of left foot (H[facility])    Diabetic ulcer of toe of left foot associated with type 2 diabetes mellitus ([facility])    Cellulitis of left lower extremity    Toe infection 11/18/2018   Peripheral edema    Acute on chronic diastolic CHF (congestive heart failure) ([place]) 11/09/2018   S/P BKA (below knee amputation) unilateral, right ([place]) 10/05/2018   Hyponatremia    Essential hypertension    Postoperative pain    Pain of left heel    Unable to maintain weight-bearing    Type 2 diabetes mellitus with diabetic peripheral angiopathy with gangrene ([facility])    Anemia due to acute blood loss    Chronic kidney disease, stage 3a    Acquired absence of right leg below knee ([place]) 09/07/2018   Gangrene of right foot ([place])    DNR (do not resuscitate) discussion    Goals of care, counseling/discussion    Palliative care by specialist    Type 2 diabetes mellitus with vascular disease ([place])    Severe protein-calorie malnutrition ([place])  Ischemic foot 08/20/2018   Critical lower limb ischemia 08/03/2018   Type 2 diabetes mellitus with foot ulcer, without long-term current use of insulin (HCC)    Gastroesophageal reflux disease    Cellulitis of great toe of right foot 05/29/2018   Atrial fibrillation, chronic    Chest pain 07/25/2017   PAF (paroxysmal atrial fibrillation) ([place]) 07/25/2017   BPH (benign prostatic  hyperplasia) 07/25/2017   Shortness of breath 05/11/2017   Hypothyroidism 05/11/2017   Chronic diastolic CHF (congestive heart failure) ([place]) 05/11/2017   Moderate aortic stenosis 06/12/2016   RBBB 06/12/2016   Peripheral vascular disease ([place]) 08/04/2012   Essential hypertension, benign 08/04/2012   Hyperlipidemia 08/04/2012   Dizziness 08/04/2012    Silvestre Mesi 08/17/2019, 11:55 AM  [facility] [address] [place], [place], [address] Phone: [phone]   Fax:  [phone]  Name: ABHISHEK LEVESQUE MRN: [id number] Date of Birth: November 01, 1927

## 2019-08-19 ENCOUNTER — Encounter: Payer: Self-pay | Admitting: Rehabilitative and Restorative Service Providers"

## 2019-08-19 ENCOUNTER — Ambulatory Visit (INDEPENDENT_AMBULATORY_CARE_PROVIDER_SITE_OTHER): Payer: Medicare Other | Admitting: Rehabilitative and Restorative Service Providers"

## 2019-08-19 ENCOUNTER — Other Ambulatory Visit: Payer: Self-pay

## 2019-08-19 DIAGNOSIS — R531 Weakness: Secondary | ICD-10-CM | POA: Diagnosis not present

## 2019-08-19 DIAGNOSIS — Z9181 History of falling: Secondary | ICD-10-CM | POA: Diagnosis not present

## 2019-08-19 DIAGNOSIS — R2681 Unsteadiness on feet: Secondary | ICD-10-CM

## 2019-08-19 DIAGNOSIS — R293 Abnormal posture: Secondary | ICD-10-CM | POA: Diagnosis not present

## 2019-08-19 DIAGNOSIS — R2689 Other abnormalities of gait and mobility: Secondary | ICD-10-CM

## 2019-08-19 NOTE — Therapy (Signed)
[facility] [address] [place], [place], [address] Phone: [phone]   Fax:  [phone]  Physical Therapy Treatment and 10th Visit Progress Note   Patient Details  Name: [name] MRN: [id number] Date of Birth: June 19, 1927 Referring Provider (PT): [name], MD   Encounter Date: 08/19/2019   PT End of Session - 08/19/19 1658    Visit Number 40    Number of Visits 50    Date for PT Re-Evaluation 09/26/19    Authorization Type [employer] Medicare    Authorization Time Period $30 co-pay    Progress Note Due on Visit 40    PT Start Time 1101    PT Stop Time 1143    PT Time Calculation (min) 42 min    Equipment Utilized During Treatment Gait belt    Activity Tolerance Patient tolerated treatment well    Behavior During Therapy WFL for tasks assessed/performed           Past Medical History:  Diagnosis Date  . Arthritis   . Borderline diabetic   . Diabetes mellitus without complication ([place])   . Glaucoma   . Hyperlipidemia   . Hypertension   . PVD (peripheral vascular disease) ([place])   . Sleep apnea    Cpap ordered but doesnt use  . Urinary retention due to benign prostatic hyperplasia 12/28/2018    Past Surgical History:  Procedure Laterality Date  . ABDOMINAL AORTOGRAM W/LOWER EXTREMITY N/A 07/27/2018   Procedure: ABDOMINAL AORTOGRAM W/LOWER EXTREMITY;  Surgeon: Lorretta Harp, MD;  Location: [place] [facility];  Service: Cardiovascular;  Laterality: N/A;  . AMPUTATION Right 08/28/2018   Procedure: RIGHT BELOW KNEE AMPUTATION;  Surgeon: [name], MD;  Location: [place];  Service: Orthopedics;  Laterality: Right;  . AMPUTATION Left 12/02/2018   Procedure: LEFT TRANSMETATARSAL AMPUTATION AND NEGATIVE PRESSURE WOUND VAC PLACEMENT;  Surgeon: [name], MD;  Location: [place];  Service: Orthopedics;  Laterality: Left;  . AMPUTATION Left 12/18/2018   Procedure: LEFT BELOW KNEE AMPUTATION;  Surgeon: [name], MD;  Location:  [place];  Service: Orthopedics;  Laterality: Left;  . APPENDECTOMY  1943  . BELOW KNEE LEG AMPUTATION Left 12/18/2018  . CATARACT EXTRACTION  2012   x2  . COLONOSCOPY N/A 11/01/2014   Procedure: COLONOSCOPY;  Surgeon: Aviva Signs, MD;  [facility];  Service: Gastroenterology;  Laterality: N/A;  . ESOPHAGOGASTRODUODENOSCOPY N/A 11/01/2014   Procedure: ESOPHAGOGASTRODUODENOSCOPY (EGD);  Surgeon: Aviva Signs, MD;  [facility];  Service: Gastroenterology;  Laterality: N/A;  . HERNIA REPAIR Left   . INGUINAL HERNIA REPAIR Right 03/01/2013   Procedure: RIGHT INGUINAL HERNIORRHAPHY;  Surgeon: Jamesetta So, MD;  Location: [facility];  Service: General;  Laterality: Right;  . INSERTION OF MESH Right 03/01/2013   Procedure: INSERTION OF MESH;  Surgeon: Jamesetta So, MD;  Location: [facility];  Service: General;  Laterality: Right;  . LOWER EXTREMITY ANGIOGRAPHY Right 08/25/2018   Procedure: LOWER EXTREMITY ANGIOGRAPHY;  Surgeon: Wellington Hampshire, MD;  Location: [place] [facility];  Service: Cardiovascular;  Laterality: Right;  . LOWER EXTREMITY ANGIOGRAPHY N/A 11/25/2018   Procedure: LOWER EXTREMITY ANGIOGRAPHY;  Surgeon: Wellington Hampshire, MD;  Location: [place] [facility];  Service: Cardiovascular;  Laterality: N/A;  . NM MYOCAR PERF WALL MOTION  06/05/2010   no significant ischemia  . PERIPHERAL VASCULAR ATHERECTOMY Right 08/03/2018   Procedure: PERIPHERAL VASCULAR ATHERECTOMY;  Surgeon: Lorretta Harp, MD;  Location:  [place] [facility];  Service: Cardiovascular;  Laterality: Right;  . PERIPHERAL VASCULAR ATHERECTOMY Left 11/23/2018   Procedure: PERIPHERAL VASCULAR ATHERECTOMY;  Surgeon: Lorretta Harp, MD;  Location: [place] [facility];  Service: Cardiovascular;  Laterality: Left;  sfa with dc balloon  . PERIPHERAL VASCULAR BALLOON ANGIOPLASTY Left 11/25/2018   Procedure: PERIPHERAL VASCULAR BALLOON ANGIOPLASTY;  Surgeon: Wellington Hampshire, MD;  Location: [place] [facility];   Service: Cardiovascular;  Laterality: Left;  popliteal  . PERIPHERAL VASCULAR INTERVENTION Left 11/25/2018   Procedure: PERIPHERAL VASCULAR INTERVENTION;  Surgeon: Wellington Hampshire, MD;  Location: [place] [facility];  Service: Cardiovascular;  Laterality: Left;  tibial peroneal trunk and peroneal stents   . stent  06/14/2010   left leg  . STUMP REVISION Left 01/20/2019   Procedure: REVISION LEFT BELOW KNEE AMPUTATION;  Surgeon: [name], MD;  Location: [place];  Service: Orthopedics;  Laterality: Left;  . US ECHOCARDIOGRAPHY  01/21/2006   moderate mitral annular ca+, mild MR, AOV moderately sclerotic.    There were no vitals filed for this visit.   Subjective Assessment - 08/19/19 1103    Subjective Patient reports he is working on his exercises at home with his daughter (who is present today). Patient's daughter reports that she was walking about 25 feet (x2) yesterday.    Patient is accompained by: Family member   son, [name]   Pertinent History Bil TTAs, DM2, PAD, HTN, CHF, A-Fib, TIA, CKDst3    Patient Stated Goals walk with prostheses in home & in/out house. Son would like him to be able to toilet & get in/out bed by himself.    Currently in Pain? No/denies    Pain Onset In the past 7 days                             [facility] Adult PT Treatment/Exercise - 08/19/19 1108      Transfers   Transfers Sit to Stand;Stand to Sit    Sit to Stand 3: Mod assist;With upper extremity assist;With armrests;From chair/3-in-1;Other (comment)   to RW or countertop   Sit to Stand Details Manual facilitation for weight shifting;Verbal cues for safe use of DME/AE;Verbal cues for technique;Verbal cues for sequencing    Sit to Stand Details (indicate cue type and reason) requires manual and verbal cues with gait belt for forward scoot in chair (to anterior edge) and strong cueing for anterior weight shift when initiating transfer    Stand to Sit 4: Min assist;With upper extremity  assist;With armrests;To chair/3-in-1;Other (comment)   from RW or countertop   Stand to Sit Details (indicate cue type and reason) Verbal cues for technique;Verbal cues for sequencing;Manual facilitation for weight shifting;Verbal cues for safe use of DME/AE    Stand to Sit Details especially when fatigued, cueing to reach for armrest prior to initiation of descent    Stand Pivot Transfers --    Comments Performed 4 repetitions of sit to stand transfer consecutively with cueing as outlined, above, for each repetition      Ambulation/Gait   Ambulation/Gait Yes    Ambulation/Gait Assistance 3: Mod assist    Ambulation/Gait Assistance Details PT and patient's daughter on either side of patient with cueing on sequencing (especially when pivoting 90 degrees - outlined, below) and for sequencing and weight shift    Ambulation Distance (Feet) 5 Feet   x 3 repetitions    Assistive device Prostheses;Rolling  walker    Gait Pattern Step-to pattern;Decreased step length - right;Decreased step length - left;Decreased hip/knee flexion - right;Decreased hip/knee flexion - left;Right foot flat;Left foot flat;Right flexed knee in stance;Left flexed knee in stance;Shuffle;Lateral hip instability;Trunk flexed;Poor foot clearance - left;Poor foot clearance - right    Ambulation Surface Level;Indoor    Gait Comments PT and patient's daughter on either side of patient with cueing for proper weight shift, RW advancement, and foot placement sequencing to perform 90 degree turn from chair to/from locked manual wheelchair        Neuro Re-ed    Neuro Re-ed Details  At countertop: move 6 cones across sink (in both directions) with cueing for postural adjustment throughout exercise and min A - mod A from PT especially on second set when fatigued. Other than brief handoff midway "across" the sink, patient had at least unilateral UE support the entire time. Multiple seated rest breaks required due to fatigue.      Knee/Hip  Exercises: Aerobic   Nustep --      Knee/Hip Exercises: Standing   Other Standing Knee Exercises --    Other Standing Knee Exercises --                    PT Short Term Goals - 08/03/19 1646      PT SHORT TERM GOAL #1   Title Patient and family verbalize & demonstrate understanding of updated HEP.  (All updated STGs Target Date: 08/26/2019)    Time 4    Period Weeks    Status On-going    Target Date 08/26/19      PT SHORT TERM GOAL #2   Title Patient ambulates 30' and turns 90* to position to sit in chair with minA.    Time 4    Period Weeks    Status On-going    Target Date 08/26/19      PT SHORT TERM GOAL #3   Title Stand -pivot transfer with RW between w/c & chair with armrests with modA    Time 4    Period Weeks    Status On-going    Target Date 08/26/19      PT SHORT TERM GOAL #4   Title Standing balance with walker support reaches to counter anterior to RW with minA    Time 4    Period Weeks    Status On-going    Target Date 08/26/19             PT Long Term Goals - 08/03/19 1646      PT LONG TERM GOAL #1   Title Patient and family verbalize and demonstrate understanding of proper prosthetic care to enable safe utilization of prostheses. (All LTGs Target Date: 09/24/2019)    Time 3    Period Months    Status On-going    Target Date 09/24/19      PT LONG TERM GOAL #2   Title Patient tolerates wear of bilateral prostheses >90% of awake hours without skin issues or limb pain to enable functional potential during his day.    Time 3    Period Months    Status On-going    Target Date 09/24/19      PT LONG TERM GOAL #3   Title Squat pivot transfer bed to w/c or chair to w/c with prostheses with supervision.    Time 3    Period Months    Status On-going    Target Date 09/24/19  PT LONG TERM GOAL #4   Title Sit to / from stand elevated w/c to walker with prostheses with minA    Time 3    Period Months    Status On-going    Target Date  09/24/19      PT LONG TERM GOAL #5   Title Standing balance with walker support with bilateral prostheses static stance for 2 minutes, scanning environment and reaches 2" anteriorly with supervision.    Time 3    Period Months    Status On-going    Target Date 09/24/19      PT LONG TERM GOAL #6   Title Patient ambulates 25' between 2 chairs including turning 90* to position to sit with RW & prostheses with minA from family safely.    Time 3    Period Months    Status On-going    Target Date 09/24/19                 Plan - 08/19/19 1659    Clinical Impression Statement Patient is demonstrating iPatient is demonstrating improvement in understanding sequencing especially for safety with transfers; however, continues to demonstrate strongly fwd flexed posture at baseline, which is exaggerated with fatigue throwing his center of gravity off. Additionally, he demonstrated only mild improvement in 90 degree turning, which may have in part been due to the fact this was performed at the end of today's session once fatigue was stronger. He tolerated today's session well with no reports of pain, but does require intermittent seated rest breaks due to fatigue. He will benefit from continued skilled PT to maximize his functional mobility.provement in understanding sequencing especially for safety with transfers; however, continues to demonstrate strongly fwd flexed posture at baseline, which is exaggerated with fatigue throwing his center of gravity off. He tolerated today's session well with no reports of pain, but does require intermittent seated rest breaks due to fatigue. He will benefit from continued skilled PT to maximize his functional mobility.    Personal Factors and Comorbidities Age;Comorbidity 3+;Fitness;Time since onset of injury/illness/exacerbation    Comorbidities Bil TTAs, DM2, PAD, HTN, CHF, A-Fib, TIA, CKDst3,    Examination-Activity Limitations Locomotion  Level;Sit;Stand;Toileting;Transfers    Examination-Participation Restrictions Community Activity    Stability/Clinical Decision Making Evolving/Moderate complexity    Rehab Potential Good    PT Frequency 2x / week    PT Duration 12 weeks   3 months   PT Treatment/Interventions ADLs/Self Care Home Management;DME Instruction;Gait training;Stair training;Functional mobility training;Therapeutic activities;Therapeutic exercise;Balance training;Neuromuscular re-education;Patient/family education;Prosthetic Training;Manual techniques;Passive range of motion;Vestibular    PT Next Visit Plan monitor HR with activity,   work towards updated STGs, family education of sit/stand assist, gait & turning 90* to sit.  Therapuetic exercise    Consulted and Agree with Plan of Care Patient;Family member/caregiver    Family Member Consulted dtr, [name]           Patient will benefit from skilled therapeutic intervention in order to improve the following deficits and impairments:  Abnormal gait, Cardiopulmonary status limiting activity, Decreased activity tolerance, Decreased balance, Decreased endurance, Decreased knowledge of use of DME, Decreased mobility, Decreased range of motion, Decreased scar mobility, Decreased strength, Increased edema, Impaired flexibility, Impaired UE functional use, Postural dysfunction, Prosthetic Dependency, Pain  Visit Diagnosis: Other abnormalities of gait and mobility  Unsteadiness on feet  Abnormal posture  History of fall  Weakness generalized     Problem List Patient Active Problem List   Diagnosis Date Noted  .  Weakness generalized 04/05/2019  . S/P BKA (below knee amputation) unilateral, left ([place]) 02/15/2019  . Pressure injury of skin 02/03/2019  . TIA (transient ischemic attack) 02/03/2019  . Transient speech disturbance 02/02/2019  . Chronic indwelling Foley catheter 01/22/2019  . Postoperative wound infection 01/20/2019  . Normocytic anemia  01/18/2019  . Cellulitis 01/18/2019  . Infection of deep incisional surgical site after procedure   . Abnormal urinalysis   . Labile blood glucose   . Urinary retention   . S/P bilateral BKA (below knee amputation) ([place])   . Acute blood loss anemia   . Urinary retention due to benign prostatic hyperplasia 12/28/2018  . Unilateral complete BKA, left, initial encounter ([place]) 12/25/2018  . Dehiscence of amputation stump ([place])   . History of transmetatarsal amputation of left foot ([place]) 12/09/2018  . Critical limb ischemia with history of revascularization of same extremity   . Gangrene of left foot ([place])   . Diabetic ulcer of toe of left foot associated with type 2 diabetes mellitus ([place])   . Cellulitis of left lower extremity   . Toe infection 11/18/2018  . Peripheral edema   . Acute on chronic diastolic CHF (congestive heart failure) ([place]) 11/09/2018  . S/P BKA (below knee amputation) unilateral, right ([place]) 10/05/2018  . Hyponatremia   . Essential hypertension   . Postoperative pain   . Pain of left heel   . Unable to maintain weight-bearing   . Type 2 diabetes mellitus with diabetic peripheral angiopathy with gangrene ([place])   . Anemia due to acute blood loss   . Chronic kidney disease, stage 3a   . Acquired absence of right leg below knee ([place]) 09/07/2018  . Gangrene of right foot ([place])   . DNR (do not resuscitate) discussion   . Goals of care, counseling/discussion   . Palliative care by specialist   . Type 2 diabetes mellitus with vascular disease ([place])   . Severe protein-calorie malnutrition ([place])   . Ischemic foot 08/20/2018  . Critical lower limb ischemia 08/03/2018  . Type 2 diabetes mellitus with foot ulcer, without long-term current use of insulin ([place])   . Gastroesophageal reflux disease   . Cellulitis of great toe of right foot 05/29/2018  . Atrial fibrillation, chronic   . Chest pain 07/25/2017  . PAF (paroxysmal atrial fibrillation) ([place]) 07/25/2017  . BPH  (benign prostatic hyperplasia) 07/25/2017  . Shortness of breath 05/11/2017  . Hypothyroidism 05/11/2017  . Chronic diastolic CHF (congestive heart failure) ([place]) 05/11/2017  . Moderate aortic stenosis 06/12/2016  . RBBB 06/12/2016  . Peripheral vascular disease ([place]) 08/04/2012  . Essential hypertension, benign 08/04/2012  . Hyperlipidemia 08/04/2012  . Dizziness 08/04/2012    Juliann Pulse, PT, DPT  08/19/2019, [facility] [address] [place], [place], [address] Phone: [phone]   Fax:  [phone]  Name: CLANCEY WELTON MRN: [id number] Date of Birth: 17-Mar-1927

## 2019-08-23 DIAGNOSIS — R3914 Feeling of incomplete bladder emptying: Secondary | ICD-10-CM | POA: Diagnosis not present

## 2019-08-24 ENCOUNTER — Ambulatory Visit (INDEPENDENT_AMBULATORY_CARE_PROVIDER_SITE_OTHER): Payer: Medicare Other | Admitting: Physical Therapy

## 2019-08-24 ENCOUNTER — Other Ambulatory Visit: Payer: Self-pay

## 2019-08-24 ENCOUNTER — Encounter: Payer: Self-pay | Admitting: Physical Therapy

## 2019-08-24 DIAGNOSIS — R531 Weakness: Secondary | ICD-10-CM

## 2019-08-24 DIAGNOSIS — Z9181 History of falling: Secondary | ICD-10-CM

## 2019-08-24 DIAGNOSIS — M6249 Contracture of muscle, multiple sites: Secondary | ICD-10-CM

## 2019-08-24 DIAGNOSIS — R2681 Unsteadiness on feet: Secondary | ICD-10-CM | POA: Diagnosis not present

## 2019-08-24 DIAGNOSIS — R293 Abnormal posture: Secondary | ICD-10-CM | POA: Diagnosis not present

## 2019-08-24 DIAGNOSIS — R2689 Other abnormalities of gait and mobility: Secondary | ICD-10-CM | POA: Diagnosis not present

## 2019-08-24 DIAGNOSIS — R600 Localized edema: Secondary | ICD-10-CM

## 2019-08-24 DIAGNOSIS — M25662 Stiffness of left knee, not elsewhere classified: Secondary | ICD-10-CM

## 2019-08-24 DIAGNOSIS — M25661 Stiffness of right knee, not elsewhere classified: Secondary | ICD-10-CM

## 2019-08-24 NOTE — Therapy (Signed)
[facility] Physical Therapy [address] [place], [place], [address] Phone: [phone]   Fax:  [phone]  Physical Therapy Treatment  Patient Details  Name: [name] MRN: [id number] Date of Birth: 29-Sep-1927 Referring Provider (PT): [name], MD   Encounter Date: 08/24/2019   PT End of Session - 08/24/19 1115    Visit Number 41    Number of Visits 50    Date for PT Re-Evaluation 09/26/19    Authorization Type [employer] Medicare    Authorization Time Period $30 co-pay    Progress Note Due on Visit 40    PT Start Time 1100    PT Stop Time 1145    PT Time Calculation (min) 45 min    Equipment Utilized During Treatment Gait belt    Activity Tolerance Patient tolerated treatment well    Behavior During Therapy WFL for tasks assessed/performed           Past Medical History:  Diagnosis Date  . Arthritis   . Borderline diabetic   . Diabetes mellitus without complication ([place])   . Glaucoma   . Hyperlipidemia   . Hypertension   . PVD (peripheral vascular disease) ([place])   . Sleep apnea    Cpap ordered but doesnt use  . Urinary retention due to benign prostatic hyperplasia 12/28/2018    Past Surgical History:  Procedure Laterality Date  . ABDOMINAL AORTOGRAM W/LOWER EXTREMITY N/A 07/27/2018   Procedure: ABDOMINAL AORTOGRAM W/LOWER EXTREMITY;  Surgeon: Lorretta Harp, MD;  Location: [place] [facility];  Service: Cardiovascular;  Laterality: N/A;  . AMPUTATION Right 08/28/2018   Procedure: RIGHT BELOW KNEE AMPUTATION;  Surgeon: [name], MD;  Location: [place];  Service: Orthopedics;  Laterality: Right;  . AMPUTATION Left 12/02/2018   Procedure: LEFT TRANSMETATARSAL AMPUTATION AND NEGATIVE PRESSURE WOUND VAC PLACEMENT;  Surgeon: [name], MD;  Location: [place];  Service: Orthopedics;  Laterality: Left;  . AMPUTATION Left 12/18/2018   Procedure: LEFT BELOW KNEE AMPUTATION;  Surgeon: [name], MD;  Location: [place];  Service:  Orthopedics;  Laterality: Left;  . APPENDECTOMY  1943  . BELOW KNEE LEG AMPUTATION Left 12/18/2018  . CATARACT EXTRACTION  2012   x2  . COLONOSCOPY N/A 11/01/2014   Procedure: COLONOSCOPY;  Surgeon: Aviva Signs, MD;  [facility];  Service: Gastroenterology;  Laterality: N/A;  . ESOPHAGOGASTRODUODENOSCOPY N/A 11/01/2014   Procedure: ESOPHAGOGASTRODUODENOSCOPY (EGD);  Surgeon: Aviva Signs, MD;  [facility];  Service: Gastroenterology;  Laterality: N/A;  . HERNIA REPAIR Left   . INGUINAL HERNIA REPAIR Right 03/01/2013   Procedure: RIGHT INGUINAL HERNIORRHAPHY;  Surgeon: Jamesetta So, MD;  [facility]: [facility];  Service: General;  Laterality: Right;  . INSERTION OF MESH Right 03/01/2013   Procedure: INSERTION OF MESH;  Surgeon: Jamesetta So, MD;  [facility];  Service: General;  Laterality: Right;  . LOWER EXTREMITY ANGIOGRAPHY Right 08/25/2018   Procedure: LOWER EXTREMITY ANGIOGRAPHY;  Surgeon: Wellington Hampshire, MD;  Location: [place] [facility];  Service: Cardiovascular;  Laterality: Right;  . LOWER EXTREMITY ANGIOGRAPHY N/A 11/25/2018   Procedure: LOWER EXTREMITY ANGIOGRAPHY;  Surgeon: Wellington Hampshire, MD;  Location: [place] [facility];  Service: Cardiovascular;  Laterality: N/A;  . NM MYOCAR PERF WALL MOTION  06/05/2010   no significant ischemia  . PERIPHERAL VASCULAR ATHERECTOMY Right 08/03/2018   Procedure: PERIPHERAL VASCULAR ATHERECTOMY;  Surgeon: Lorretta Harp, MD;  Location: [place] [facility];  Service:  Cardiovascular;  Laterality: Right;  . PERIPHERAL VASCULAR ATHERECTOMY Left 11/23/2018   Procedure: PERIPHERAL VASCULAR ATHERECTOMY;  Surgeon: Lorretta Harp, MD;  Location: [place] [facility];  Service: Cardiovascular;  Laterality: Left;  sfa with dc balloon  . PERIPHERAL VASCULAR BALLOON ANGIOPLASTY Left 11/25/2018   Procedure: PERIPHERAL VASCULAR BALLOON ANGIOPLASTY;  Surgeon: Wellington Hampshire, MD;  Location: [place] [facility];  Service:  Cardiovascular;  Laterality: Left;  popliteal  . PERIPHERAL VASCULAR INTERVENTION Left 11/25/2018   Procedure: PERIPHERAL VASCULAR INTERVENTION;  Surgeon: Wellington Hampshire, MD;  Location: [place] [facility];  Service: Cardiovascular;  Laterality: Left;  tibial peroneal trunk and peroneal stents   . stent  06/14/2010   left leg  . STUMP REVISION Left 01/20/2019   Procedure: REVISION LEFT BELOW KNEE AMPUTATION;  Surgeon: [name], MD;  Location: [place];  Service: Orthopedics;  Laterality: Left;  . US ECHOCARDIOGRAPHY  01/21/2006   moderate mitral annular ca+, mild MR, AOV moderately sclerotic.    There were no vitals filed for this visit.   Subjective Assessment - 08/24/19 1100    Subjective He had prostate procedure yesterday and will wear folly catheter for 2 more weeks then attempt urinal during day & condom catheter at night.  He is walking some with daughter but not as confident as PT.    Patient is accompained by: Family member   son, [name]   Pertinent History Bil TTAs, DM2, PAD, HTN, CHF, A-Fib, TIA, CKDst3    Patient Stated Goals walk with prostheses in home & in/out house. Son would like him to be able to toilet & get in/out bed by himself.    Currently in Pain? No/denies    Pain Onset In the past 7 days                             [facility] Adult PT Treatment/Exercise - 08/24/19 1100      Transfers   Transfers Sit to Stand;Stand to Sit    Sit to Stand 3: Mod assist;With upper extremity assist;With armrests;From chair/3-in-1;Other (comment)   to RW or countertop   Sit to Stand Details Manual facilitation for weight shifting;Verbal cues for safe use of DME/AE;Verbal cues for technique;Verbal cues for sequencing    Stand to Sit 4: Min assist;With upper extremity assist;With armrests;To chair/3-in-1;Other (comment)   from RW or countertop   Stand to Sit Details (indicate cue type and reason) Verbal cues for technique;Verbal cues for sequencing;Manual  facilitation for weight shifting;Verbal cues for safe use of DME/AE    Comments 3 reps from 24" stool pushing on RW with minA and verbal cues on technique / weight shift      Ambulation/Gait   Ambulation/Gait Yes    Ambulation/Gait Assistance 2: Max assist   2 person   Ambulation/Gait Assistance Details Patient had increased flexion of trunk & BLEs today causing increased need for support.     Ambulation Distance (Feet) 25 Feet   25' & 15'   Assistive device Prostheses;Rolling walker    Gait Pattern Step-to pattern;Decreased step length - right;Decreased step length - left;Decreased hip/knee flexion - right;Decreased hip/knee flexion - left;Right foot flat;Left foot flat;Right flexed knee in stance;Left flexed knee in stance;Shuffle;Lateral hip instability;Trunk flexed;Poor foot clearance - left;Poor foot clearance - right    Ambulation Surface Level;Indoor    Gait Comments PT and patient's daughter on either side of patient with cueing for proper weight  shift, RW advancement, and foot placement sequencing to perform 90 degree turn from chair to/from locked manual wheelchair        Knee/Hip Exercises: Machines for Strengthening   Cybex Leg Press Shuttle Leg Press Back at 45* BLEs 87# 15 reps Back flat BLEs 87# 15 reps, single leg 50# 15 reps ea LE                    PT Short Term Goals - 08/03/19 1646      PT SHORT TERM GOAL #1   Title Patient and family verbalize & demonstrate understanding of updated HEP.  (All updated STGs Target Date: 08/26/2019)    Time 4    Period Weeks    Status On-going    Target Date 08/26/19      PT SHORT TERM GOAL #2   Title Patient ambulates 30' and turns 90* to position to sit in chair with minA.    Time 4    Period Weeks    Status On-going    Target Date 08/26/19      PT SHORT TERM GOAL #3   Title Stand -pivot transfer with RW between w/c & chair with armrests with modA    Time 4    Period Weeks    Status On-going    Target Date 08/26/19       PT SHORT TERM GOAL #4   Title Standing balance with walker support reaches to counter anterior to RW with minA    Time 4    Period Weeks    Status On-going    Target Date 08/26/19             PT Long Term Goals - 08/03/19 1646      PT LONG TERM GOAL #1   Title Patient and family verbalize and demonstrate understanding of proper prosthetic care to enable safe utilization of prostheses. (All LTGs Target Date: 09/24/2019)    Time 3    Period Months    Status On-going    Target Date 09/24/19      PT LONG TERM GOAL #2   Title Patient tolerates wear of bilateral prostheses >90% of awake hours without skin issues or limb pain to enable functional potential during his day.    Time 3    Period Months    Status On-going    Target Date 09/24/19      PT LONG TERM GOAL #3   Title Squat pivot transfer bed to w/c or chair to w/c with prostheses with supervision.    Time 3    Period Months    Status On-going    Target Date 09/24/19      PT LONG TERM GOAL #4   Title Sit to / from stand elevated w/c to walker with prostheses with minA    Time 3    Period Months    Status On-going    Target Date 09/24/19      PT LONG TERM GOAL #5   Title Standing balance with walker support with bilateral prostheses static stance for 2 minutes, scanning environment and reaches 2" anteriorly with supervision.    Time 3    Period Months    Status On-going    Target Date 09/24/19      PT LONG TERM GOAL #6   Title Patient ambulates 25' between 2 chairs including turning 90* to position to sit with RW & prostheses with minA from family safely.    Time 3  Period Months    Status On-going    Target Date 09/24/19                 Plan - 08/24/19 1118    Clinical Impression Statement Patient has not been able to take vitamins for 2 weeks and some medications for last few days to prepare for Prostate surgery. He was weaker today which is probably related to this.  Patient's daughter was  able to assist with increased need for assistiance with PT assistance also.    Personal Factors and Comorbidities Age;Comorbidity 3+;Fitness;Time since onset of injury/illness/exacerbation    Comorbidities Bil TTAs, DM2, PAD, HTN, CHF, A-Fib, TIA, CKDst3,    Examination-Activity Limitations Locomotion Level;Sit;Stand;Toileting;Transfers    Examination-Participation Restrictions Community Activity    Stability/Clinical Decision Making Evolving/Moderate complexity    Rehab Potential Good    PT Frequency 2x / week    PT Duration 12 weeks   3 months   PT Treatment/Interventions ADLs/Self Care Home Management;DME Instruction;Gait training;Stair training;Functional mobility training;Therapeutic activities;Therapeutic exercise;Balance training;Neuromuscular re-education;Patient/family education;Prosthetic Training;Manual techniques;Passive range of motion;Vestibular    PT Next Visit Plan monitor HR with activity,   check updated STGs, family education of sit/stand assist, gait & turning 90* to sit.  Therapuetic exercise    Consulted and Agree with Plan of Care Patient;Family member/caregiver    Family Member Consulted dtr, [name]           Patient will benefit from skilled therapeutic intervention in order to improve the following deficits and impairments:  Abnormal gait, Cardiopulmonary status limiting activity, Decreased activity tolerance, Decreased balance, Decreased endurance, Decreased knowledge of use of DME, Decreased mobility, Decreased range of motion, Decreased scar mobility, Decreased strength, Increased edema, Impaired flexibility, Impaired UE functional use, Postural dysfunction, Prosthetic Dependency, Pain  Visit Diagnosis: Other abnormalities of gait and mobility  Unsteadiness on feet  Abnormal posture  History of fall  Weakness generalized  Stiffness of right knee, not elsewhere classified  Stiffness of left knee, not elsewhere classified  Contracture of muscle,  multiple sites  Localized edema     Problem List Patient Active Problem List   Diagnosis Date Noted  . Weakness generalized 04/05/2019  . S/P BKA (below knee amputation) unilateral, left ([place]) 02/15/2019  . Pressure injury of skin 02/03/2019  . TIA (transient ischemic attack) 02/03/2019  . Transient speech disturbance 02/02/2019  . Chronic indwelling Foley catheter 01/22/2019  . Postoperative wound infection 01/20/2019  . Normocytic anemia 01/18/2019  . Cellulitis 01/18/2019  . Infection of deep incisional surgical site after procedure   . Abnormal urinalysis   . Labile blood glucose   . Urinary retention   . S/P bilateral BKA (below knee amputation) ([place])   . Acute blood loss anemia   . Urinary retention due to benign prostatic hyperplasia 12/28/2018  . Unilateral complete BKA, left, initial encounter ([place]) 12/25/2018  . Dehiscence of amputation stump ([place])   . History of transmetatarsal amputation of left foot ([place]) 12/09/2018  . Critical limb ischemia with history of revascularization of same extremity   . Gangrene of left foot ([place])   . Diabetic ulcer of toe of left foot associated with type 2 diabetes mellitus ([place])   . Cellulitis of left lower extremity   . Toe infection 11/18/2018  . Peripheral edema   . Acute on chronic diastolic CHF (congestive heart failure) ([place]) 11/09/2018  . S/P BKA (below knee amputation) unilateral, right ([place]) 10/05/2018  . Hyponatremia   . Essential hypertension   .  Postoperative pain   . Pain of left heel   . Unable to maintain weight-bearing   . Type 2 diabetes mellitus with diabetic peripheral angiopathy with gangrene ([place])   . Anemia due to acute blood loss   . Chronic kidney disease, stage 3a   . Acquired absence of right leg below knee ([place]) 09/07/2018  . Gangrene of right foot ([place])   . DNR (do not resuscitate) discussion   . Goals of care, counseling/discussion   . Palliative care by specialist   . Type 2 diabetes mellitus  with vascular disease ([place])   . Severe protein-calorie malnutrition ([place])   . Ischemic foot 08/20/2018  . Critical lower limb ischemia 08/03/2018  . Type 2 diabetes mellitus with foot ulcer, without long-term current use of insulin ([place])   . Gastroesophageal reflux disease   . Cellulitis of great toe of right foot 05/29/2018  . Atrial fibrillation, chronic   . Chest pain 07/25/2017  . PAF (paroxysmal atrial fibrillation) ([place]) 07/25/2017  . BPH (benign prostatic hyperplasia) 07/25/2017  . Shortness of breath 05/11/2017  . Hypothyroidism 05/11/2017  . Chronic diastolic CHF (congestive heart failure) ([place]) 05/11/2017  . Moderate aortic stenosis 06/12/2016  . RBBB 06/12/2016  . Peripheral vascular disease ([place]) 08/04/2012  . Essential hypertension, benign 08/04/2012  . Hyperlipidemia 08/04/2012  . Dizziness 08/04/2012    Jamey Reas PT, DPT 08/24/2019, 3:46 PM  [facility] Physical Therapy [address] [place], [place], [address] Phone: [phone]   Fax:  [phone]  Name: ABIE CHEEK MRN: [id number] Date of Birth: [birth year]

## 2019-08-25 DIAGNOSIS — H401122 Primary open-angle glaucoma, left eye, moderate stage: Secondary | ICD-10-CM | POA: Diagnosis not present

## 2019-08-25 DIAGNOSIS — H401111 Primary open-angle glaucoma, right eye, mild stage: Secondary | ICD-10-CM | POA: Diagnosis not present

## 2019-08-26 ENCOUNTER — Ambulatory Visit: Payer: Medicare Other | Admitting: Physical Therapy

## 2019-08-26 ENCOUNTER — Ambulatory Visit (HOSPITAL_COMMUNITY)
Admission: RE | Admit: 2019-08-26 | Discharge: 2019-08-26 | Disposition: A | Discharge status: Home / Self Care | Payer: Medicare Other | Source: Ambulatory Visit | Attending: Internal Medicine | Admitting: Internal Medicine

## 2019-08-26 ENCOUNTER — Other Ambulatory Visit: Payer: Self-pay

## 2019-08-26 ENCOUNTER — Encounter: Payer: Self-pay | Admitting: Physical Therapy

## 2019-08-26 DIAGNOSIS — R531 Weakness: Secondary | ICD-10-CM

## 2019-08-26 DIAGNOSIS — M6249 Contracture of muscle, multiple sites: Secondary | ICD-10-CM

## 2019-08-26 DIAGNOSIS — I5033 Acute on chronic diastolic (congestive) heart failure: Secondary | ICD-10-CM | POA: Diagnosis not present

## 2019-08-26 DIAGNOSIS — M25662 Stiffness of left knee, not elsewhere classified: Secondary | ICD-10-CM

## 2019-08-26 DIAGNOSIS — R2689 Other abnormalities of gait and mobility: Secondary | ICD-10-CM | POA: Diagnosis not present

## 2019-08-26 DIAGNOSIS — R2681 Unsteadiness on feet: Secondary | ICD-10-CM

## 2019-08-26 DIAGNOSIS — R600 Localized edema: Secondary | ICD-10-CM

## 2019-08-26 DIAGNOSIS — R293 Abnormal posture: Secondary | ICD-10-CM | POA: Diagnosis not present

## 2019-08-26 DIAGNOSIS — M25661 Stiffness of right knee, not elsewhere classified: Secondary | ICD-10-CM

## 2019-08-26 DIAGNOSIS — Z9181 History of falling: Secondary | ICD-10-CM | POA: Diagnosis not present

## 2019-08-26 LAB — ECHOCARDIOGRAM COMPLETE
AR max vel: 0.79 cm2
AV Area VTI: 0.77 cm2
AV Area mean vel: 0.71 cm2
AV Mean grad: 27.4 mmHg
AV Peak grad: 44.6 mmHg
Ao pk vel: 3.34 m/s
Area-P 1/2: 1.9 cm2
S' Lateral: 2.82 cm

## 2019-08-26 NOTE — Therapy (Signed)
[facility] [address] [place], [place], [address] Phone: [phone]   Fax:  [phone]  Physical Therapy Treatment  Patient Details  Name: [name] MRN: [id number] Date of Birth: Aug 30, 1927 Referring Provider (PT): [name], MD   Encounter Date: 08/26/2019   PT End of Session - 08/26/19 1054    Visit Number 42    Number of Visits 50    Date for PT Re-Evaluation 09/26/19    Authorization Type [employer] Medicare    Authorization Time Period $30 co-pay    Progress Note Due on Visit 24    PT Start Time 1055    PT Stop Time 1145    PT Time Calculation (min) 50 min    Equipment Utilized During Treatment Gait belt    Activity Tolerance Patient tolerated treatment well    Behavior During Therapy WFL for tasks assessed/performed           Past Medical History:  Diagnosis Date  . Arthritis   . Borderline diabetic   . Diabetes mellitus without complication ([place])   . Glaucoma   . Hyperlipidemia   . Hypertension   . PVD (peripheral vascular disease) ([place])   . Sleep apnea    Cpap ordered but doesnt use  . Urinary retention due to benign prostatic hyperplasia 12/28/2018    Past Surgical History:  Procedure Laterality Date  . ABDOMINAL AORTOGRAM W/LOWER EXTREMITY N/A 07/27/2018   Procedure: ABDOMINAL AORTOGRAM W/LOWER EXTREMITY;  Surgeon: Lorretta Harp, MD;  Location: [place] [facility];  Service: Cardiovascular;  Laterality: N/A;  . AMPUTATION Right 08/28/2018   Procedure: RIGHT BELOW KNEE AMPUTATION;  Surgeon: [name], MD;  Location: [place];  Service: Orthopedics;  Laterality: Right;  . AMPUTATION Left 12/02/2018   Procedure: LEFT TRANSMETATARSAL AMPUTATION AND NEGATIVE PRESSURE WOUND VAC PLACEMENT;  Surgeon: [name], MD;  Location: [place];  Service: Orthopedics;  Laterality: Left;  . AMPUTATION Left 12/18/2018   Procedure: LEFT BELOW KNEE AMPUTATION;  Surgeon: [name], MD;  Location: [place];  Service:  Orthopedics;  Laterality: Left;  . APPENDECTOMY  1943  . BELOW KNEE LEG AMPUTATION Left 12/18/2018  . CATARACT EXTRACTION  2012   x2  . COLONOSCOPY N/A 11/01/2014   Procedure: COLONOSCOPY;  Surgeon: Aviva Signs, MD;  [facility];  Service: Gastroenterology;  Laterality: N/A;  . ESOPHAGOGASTRODUODENOSCOPY N/A 11/01/2014   Procedure: ESOPHAGOGASTRODUODENOSCOPY (EGD);  Surgeon: Aviva Signs, MD;  [facility];  Service: Gastroenterology;  Laterality: N/A;  . HERNIA REPAIR Left   . INGUINAL HERNIA REPAIR Right 03/01/2013   Procedure: RIGHT INGUINAL HERNIORRHAPHY;  Surgeon: Jamesetta So, MD;  Location: [facility];  Service: General;  Laterality: Right;  . INSERTION OF MESH Right 03/01/2013   Procedure: INSERTION OF MESH;  Surgeon: Jamesetta So, MD;  Location: [facility];  Service: General;  Laterality: Right;  . LOWER EXTREMITY ANGIOGRAPHY Right 08/25/2018   Procedure: LOWER EXTREMITY ANGIOGRAPHY;  Surgeon: Wellington Hampshire, MD;  Location: [place] [facility];  Service: Cardiovascular;  Laterality: Right;  . LOWER EXTREMITY ANGIOGRAPHY N/A 11/25/2018   Procedure: LOWER EXTREMITY ANGIOGRAPHY;  Surgeon: Wellington Hampshire, MD;  Location: [place] [facility];  Service: Cardiovascular;  Laterality: N/A;  . NM MYOCAR PERF WALL MOTION  06/05/2010   no significant ischemia  . PERIPHERAL VASCULAR ATHERECTOMY Right 08/03/2018   Procedure: PERIPHERAL VASCULAR ATHERECTOMY;  Surgeon: Lorretta Harp, MD;  Location: [place] [facility];  Service:  Cardiovascular;  Laterality: Right;  . PERIPHERAL VASCULAR ATHERECTOMY Left 11/23/2018   Procedure: PERIPHERAL VASCULAR ATHERECTOMY;  Surgeon: Lorretta Harp, MD;  Location: [place] [facility];  Service: Cardiovascular;  Laterality: Left;  sfa with dc balloon  . PERIPHERAL VASCULAR BALLOON ANGIOPLASTY Left 11/25/2018   Procedure: PERIPHERAL VASCULAR BALLOON ANGIOPLASTY;  Surgeon: Wellington Hampshire, MD;  Location: [place] [facility];  Service:  Cardiovascular;  Laterality: Left;  popliteal  . PERIPHERAL VASCULAR INTERVENTION Left 11/25/2018   Procedure: PERIPHERAL VASCULAR INTERVENTION;  Surgeon: Wellington Hampshire, MD;  Location: [place] [facility];  Service: Cardiovascular;  Laterality: Left;  tibial peroneal trunk and peroneal stents   . stent  06/14/2010   left leg  . STUMP REVISION Left 01/20/2019   Procedure: REVISION LEFT BELOW KNEE AMPUTATION;  Surgeon: [name], MD;  Location: [place];  Service: Orthopedics;  Laterality: Left;  . US ECHOCARDIOGRAPHY  01/21/2006   moderate mitral annular ca+, mild MR, AOV moderately sclerotic.    There were no vitals filed for this visit.   Subjective Assessment - 08/26/19 1054    Subjective His daughter reports that he is getting back to normal strength after prostate procedure 3 days ago.    Patient is accompained by: Family member   son, [name]   Pertinent History Bil TTAs, DM2, PAD, HTN, CHF, A-Fib, TIA, CKDst3    Patient Stated Goals walk with prostheses in home & in/out house. Son would like him to be able to toilet & get in/out bed by himself.    Currently in Pain? No/denies    Pain Onset In the past 7 days                             [facility] Adult PT Treatment/Exercise - 08/26/19 1100      Transfers   Transfers Sit to Stand;Stand to Sit    Sit to Stand 3: Mod assist;With upper extremity assist;With armrests;From chair/3-in-1;Other (comment);4: Min assist   to RW modA or sink minA   Sit to Stand Details Manual facilitation for weight shifting;Verbal cues for safe use of DME/AE;Verbal cues for technique;Verbal cues for sequencing    Stand to Sit 4: Min assist;With upper extremity assist;With armrests;To chair/3-in-1;Other (comment)   from RW or sink   Stand to Sit Details (indicate cue type and reason) Verbal cues for technique;Verbal cues for sequencing;Manual facilitation for weight shifting;Verbal cues for safe use of DME/AE    Stand Pivot Transfers 3:  Mod assist   RW & TTA prostheses b/w w/c & chair w/armrests   Stand Pivot Transfer Details (indicate cue type and reason) verbal & tactile cues on technique      Ambulation/Gait   Ambulation/Gait Yes    Ambulation/Gait Assistance 3: Mod assist   2 person for safety   Ambulation/Gait Assistance Details tactile / manual and verbal cues on pelvic weight shift to stance limb    Ambulation Distance (Feet) 40 Feet   30' + 90* turn,  then 40' straight path as max tolerable   Assistive device Prostheses;Rolling walker    Gait Pattern Step-to pattern;Decreased step length - right;Decreased step length - left;Decreased hip/knee flexion - right;Decreased hip/knee flexion - left;Right foot flat;Left foot flat;Right flexed knee in stance;Left flexed knee in stance;Shuffle;Lateral hip instability;Trunk flexed;Poor foot clearance - left;Poor foot clearance - right    Ambulation Surface Level;Indoor      Self-Care   Other Self-Care Comments  reaching anteriorly with RW support to just anterior to RW (within length of his arm) with minA      Knee/Hip Exercises: Aerobic   Nustep Level 6 BUEs & BLEs 9 min      Prosthetics   Prosthetic Care Comments  shrinker under liners can cause slippage over time due to weight or prosthesis without full grip of silicon on skin.  Shrinkers wrinkling distally with slippage maybe pain that he reported today.  PT corrected between 2 walks and he reported that it felt better.     Current prosthetic wear tolerance (days/week)  daily    Current prosthetic wear tolerance (#hours/day)  most of awake hours - up to 6hrs without shrinker under liners.     Edema non-pitting    Residual limb condition  no open areas, normal color & temperature,     Education Provided Other (comment)   see prosthetic care comments   Person(s) Educated Patient;Child(ren)    Education Method Explanation;Verbal cues;Demonstration    Education Method Verbalized understanding;Needs further instruction                     PT Short Term Goals - 08/26/19 1318      PT SHORT TERM GOAL #1   Title Patient and family verbalize & demonstrate understanding of updated HEP.  (All updated STGs Target Date: 08/26/2019)    Baseline MET 08/26/2019    Time 4    Period Weeks    Status Achieved    Target Date 08/26/19      PT SHORT TERM GOAL #2   Title Patient ambulates 30' and turns 90* to position to sit in chair with minA.    Baseline MET 08/26/2019    Time 4    Period Weeks    Status Achieved    Target Date 08/26/19      PT SHORT TERM GOAL #3   Title Stand -pivot transfer with RW between w/c & chair with armrests with modA    Baseline MET 08/26/2019    Time 4    Period Weeks    Status Achieved    Target Date 08/26/19      PT SHORT TERM GOAL #4   Title Standing balance with walker support reaches to counter anterior to RW with minA    Baseline MET 08/26/2019    Time 4    Period Weeks    Status Achieved    Target Date 08/26/19             PT Long Term Goals - 08/03/19 1646      PT LONG TERM GOAL #1   Title Patient and family verbalize and demonstrate understanding of proper prosthetic care to enable safe utilization of prostheses. (All LTGs Target Date: 09/24/2019)    Time 3    Period Months    Status On-going    Target Date 09/24/19      PT LONG TERM GOAL #2   Title Patient tolerates wear of bilateral prostheses >90% of awake hours without skin issues or limb pain to enable functional potential during his day.    Time 3    Period Months    Status On-going    Target Date 09/24/19      PT LONG TERM GOAL #3   Title Squat pivot transfer bed to w/c or chair to w/c with prostheses with supervision.    Time 3    Period Months    Status On-going  Target Date 09/24/19      PT LONG TERM GOAL #4   Title Sit to / from stand elevated w/c to walker with prostheses with minA    Time 3    Period Months    Status On-going    Target Date 09/24/19      PT LONG TERM GOAL #5    Title Standing balance with walker support with bilateral prostheses static stance for 2 minutes, scanning environment and reaches 2" anteriorly with supervision.    Time 3    Period Months    Status On-going    Target Date 09/24/19      PT LONG TERM GOAL #6   Title Patient ambulates 25' between 2 chairs including turning 90* to position to sit with RW & prostheses with minA from family safely.    Time 3    Period Months    Status On-going    Target Date 09/24/19                 Plan - 08/26/19 1054    Clinical Impression Statement Patient met all STGs set for this 30-day period. PT had patient to stand at sink for 1 minute & sit to/from stand 2x prior to gait to stretch into upright posture. This appears to have helped his gait.    Personal Factors and Comorbidities Age;Comorbidity 3+;Fitness;Time since onset of injury/illness/exacerbation    Comorbidities Bil TTAs, DM2, PAD, HTN, CHF, A-Fib, TIA, CKDst3,    Examination-Activity Limitations Locomotion Level;Sit;Stand;Toileting;Transfers    Examination-Participation Restrictions Community Activity    Stability/Clinical Decision Making Evolving/Moderate complexity    Rehab Potential Good    PT Frequency 2x / week    PT Duration 12 weeks   3 months   PT Treatment/Interventions ADLs/Self Care Home Management;DME Instruction;Gait training;Stair training;Functional mobility training;Therapeutic activities;Therapeutic exercise;Balance training;Neuromuscular re-education;Patient/family education;Prosthetic Training;Manual techniques;Passive range of motion;Vestibular    PT Next Visit Plan monitor HR with activity,   work towards LTGs,  Therapuetic exercise    Consulted and Agree with Plan of Care Patient;Family member/caregiver    Family Member Consulted dtr, [name]           Patient will benefit from skilled therapeutic intervention in order to improve the following deficits and impairments:  Abnormal gait, Cardiopulmonary  status limiting activity, Decreased activity tolerance, Decreased balance, Decreased endurance, Decreased knowledge of use of DME, Decreased mobility, Decreased range of motion, Decreased scar mobility, Decreased strength, Increased edema, Impaired flexibility, Impaired UE functional use, Postural dysfunction, Prosthetic Dependency, Pain  Visit Diagnosis: Other abnormalities of gait and mobility  Unsteadiness on feet  Abnormal posture  History of fall  Weakness generalized  Stiffness of right knee, not elsewhere classified  Stiffness of left knee, not elsewhere classified  Contracture of muscle, multiple sites  Localized edema     Problem List Patient Active Problem List   Diagnosis Date Noted  . Weakness generalized 04/05/2019  . S/P BKA (below knee amputation) unilateral, left ([place]) 02/15/2019  . Pressure injury of skin 02/03/2019  . TIA (transient ischemic attack) 02/03/2019  . Transient speech disturbance 02/02/2019  . Chronic indwelling Foley catheter 01/22/2019  . Postoperative wound infection 01/20/2019  . Normocytic anemia 01/18/2019  . Cellulitis 01/18/2019  . Infection of deep incisional surgical site after procedure   . Abnormal urinalysis   . Labile blood glucose   . Urinary retention   . S/P bilateral BKA (below knee amputation) ([place])   . Acute blood loss anemia   .  Urinary retention due to benign prostatic hyperplasia 12/28/2018  . Unilateral complete BKA, left, initial encounter ([place]) 12/25/2018  . Dehiscence of amputation stump ([place])   . History of transmetatarsal amputation of left foot ([place]) 12/09/2018  . Critical limb ischemia with history of revascularization of same extremity   . Gangrene of left foot ([place])   . Diabetic ulcer of toe of left foot associated with type 2 diabetes mellitus ([place])   . Cellulitis of left lower extremity   . Toe infection 11/18/2018  . Peripheral edema   . Acute on chronic diastolic CHF (congestive heart failure) ([place])  11/09/2018  . S/P BKA (below knee amputation) unilateral, right ([place]) 10/05/2018  . Hyponatremia   . Essential hypertension   . Postoperative pain   . Pain of left heel   . Unable to maintain weight-bearing   . Type 2 diabetes mellitus with diabetic peripheral angiopathy with gangrene ([place])   . Anemia due to acute blood loss   . Chronic kidney disease, stage 3a   . Acquired absence of right leg below knee ([place]) 09/07/2018  . Gangrene of right foot ([place])   . DNR (do not resuscitate) discussion   . Goals of care, counseling/discussion   . Palliative care by specialist   . Type 2 diabetes mellitus with vascular disease ([place])   . Severe protein-calorie malnutrition ([place])   . Ischemic foot 08/20/2018  . Critical lower limb ischemia 08/03/2018  . Type 2 diabetes mellitus with foot ulcer, without long-term current use of insulin ([place])   . Gastroesophageal reflux disease   . Cellulitis of great toe of right foot 05/29/2018  . Atrial fibrillation, chronic   . Chest pain 07/25/2017  . PAF (paroxysmal atrial fibrillation) ([place]) 07/25/2017  . BPH (benign prostatic hyperplasia) 07/25/2017  . Shortness of breath 05/11/2017  . Hypothyroidism 05/11/2017  . Chronic diastolic CHF (congestive heart failure) ([place]) 05/11/2017  . Moderate aortic stenosis 06/12/2016  . RBBB 06/12/2016  . Peripheral vascular disease ([place]) 08/04/2012  . Essential hypertension, benign 08/04/2012  . Hyperlipidemia 08/04/2012  . Dizziness 08/04/2012    Jamey Reas PT, DPT 08/26/2019, 1:29 PM  [facility] [address] [place], [place], [address] Phone: [phone]   Fax:  [phone]  Name: JENO CALLEROS MRN: [id number] Date of Birth: 1927-09-20

## 2019-08-26 NOTE — Progress Notes (Signed)
*  PRELIMINARY RESULTS* Echocardiogram 2D Echocardiogram has been performed. Patients' blood pressure elevated. Patient felt fine reported no headache, dizziness, blurred vision. Told patient and daughter if he feels bad seek help and make sure cardiologist aware of high blood pressure. Leavy Cella 08/26/2019, 2:06 PM

## 2019-08-30 ENCOUNTER — Encounter: Payer: Self-pay | Admitting: Physical Therapy

## 2019-08-30 ENCOUNTER — Other Ambulatory Visit: Payer: Self-pay

## 2019-08-30 ENCOUNTER — Ambulatory Visit: Payer: Medicare Other | Admitting: Physical Therapy

## 2019-08-30 DIAGNOSIS — R2689 Other abnormalities of gait and mobility: Secondary | ICD-10-CM

## 2019-08-30 DIAGNOSIS — R2681 Unsteadiness on feet: Secondary | ICD-10-CM | POA: Diagnosis not present

## 2019-08-30 DIAGNOSIS — R600 Localized edema: Secondary | ICD-10-CM

## 2019-08-30 DIAGNOSIS — Z9181 History of falling: Secondary | ICD-10-CM

## 2019-08-30 DIAGNOSIS — R531 Weakness: Secondary | ICD-10-CM

## 2019-08-30 DIAGNOSIS — M25661 Stiffness of right knee, not elsewhere classified: Secondary | ICD-10-CM

## 2019-08-30 DIAGNOSIS — M25662 Stiffness of left knee, not elsewhere classified: Secondary | ICD-10-CM

## 2019-08-30 DIAGNOSIS — M6249 Contracture of muscle, multiple sites: Secondary | ICD-10-CM

## 2019-08-30 DIAGNOSIS — R293 Abnormal posture: Secondary | ICD-10-CM

## 2019-08-30 NOTE — Therapy (Signed)
[facility] Physical Therapy [address] [place], [place], [address] Phone: [phone]   Fax:  [phone]  Physical Therapy Treatment  Patient Details  Name: [name] MRN: [id number] Date of Birth: 11-10-1927 Referring Provider (PT): [name], MD   Encounter Date: 08/30/2019   PT End of Session - 08/30/19 1138    Visit Number 43    Number of Visits 50    Date for PT Re-Evaluation 09/26/19    Authorization Type [employer] Medicare    Authorization Time Period $30 co-pay    Progress Note Due on Visit 26    PT Start Time 1140    PT Stop Time 1230    PT Time Calculation (min) 50 min    Equipment Utilized During Treatment Gait belt    Activity Tolerance Patient tolerated treatment well    Behavior During Therapy WFL for tasks assessed/performed           Past Medical History:  Diagnosis Date  . Arthritis   . Borderline diabetic   . Diabetes mellitus without complication ([place])   . Glaucoma   . Hyperlipidemia   . Hypertension   . PVD (peripheral vascular disease) ([place])   . Sleep apnea    Cpap ordered but doesnt use  . Urinary retention due to benign prostatic hyperplasia 12/28/2018    Past Surgical History:  Procedure Laterality Date  . ABDOMINAL AORTOGRAM W/LOWER EXTREMITY N/A 07/27/2018   Procedure: ABDOMINAL AORTOGRAM W/LOWER EXTREMITY;  Surgeon: Lorretta Harp, MD;  Location: [place] [facility];  Service: Cardiovascular;  Laterality: N/A;  . AMPUTATION Right 08/28/2018   Procedure: RIGHT BELOW KNEE AMPUTATION;  Surgeon: [name], MD;  Location: [place];  Service: Orthopedics;  Laterality: Right;  . AMPUTATION Left 12/02/2018   Procedure: LEFT TRANSMETATARSAL AMPUTATION AND NEGATIVE PRESSURE WOUND VAC PLACEMENT;  Surgeon: [name], MD;  Location: [place];  Service: Orthopedics;  Laterality: Left;  . AMPUTATION Left 12/18/2018   Procedure: LEFT BELOW KNEE AMPUTATION;  Surgeon: [name], MD;  Location: [place];  Service:  Orthopedics;  Laterality: Left;  . APPENDECTOMY  1943  . BELOW KNEE LEG AMPUTATION Left 12/18/2018  . CATARACT EXTRACTION  2012   x2  . COLONOSCOPY N/A 11/01/2014   Procedure: COLONOSCOPY;  Surgeon: Aviva Signs, MD;  [facility];  Service: Gastroenterology;  Laterality: N/A;  . ESOPHAGOGASTRODUODENOSCOPY N/A 11/01/2014   Procedure: ESOPHAGOGASTRODUODENOSCOPY (EGD);  Surgeon: Aviva Signs, MD;  [facility];  Service: Gastroenterology;  Laterality: N/A;  . HERNIA REPAIR Left   . INGUINAL HERNIA REPAIR Right 03/01/2013   Procedure: RIGHT INGUINAL HERNIORRHAPHY;  Surgeon: Jamesetta So, MD;  Location: [facility];  Service: General;  Laterality: Right;  . INSERTION OF MESH Right 03/01/2013   Procedure: INSERTION OF MESH;  Surgeon: Jamesetta So, MD;  [facility];  Service: General;  Laterality: Right;  . LOWER EXTREMITY ANGIOGRAPHY Right 08/25/2018   Procedure: LOWER EXTREMITY ANGIOGRAPHY;  Surgeon: Wellington Hampshire, MD;  Location: [place] [facility];  Service: Cardiovascular;  Laterality: Right;  . LOWER EXTREMITY ANGIOGRAPHY N/A 11/25/2018   Procedure: LOWER EXTREMITY ANGIOGRAPHY;  Surgeon: Wellington Hampshire, MD;  Location: [place] [facility];  Service: Cardiovascular;  Laterality: N/A;  . NM MYOCAR PERF WALL MOTION  06/05/2010   no significant ischemia  . PERIPHERAL VASCULAR ATHERECTOMY Right 08/03/2018   Procedure: PERIPHERAL VASCULAR ATHERECTOMY;  Surgeon: Lorretta Harp, MD;  Location: [place] [facility];  Service:  Cardiovascular;  Laterality: Right;  . PERIPHERAL VASCULAR ATHERECTOMY Left 11/23/2018   Procedure: PERIPHERAL VASCULAR ATHERECTOMY;  Surgeon: Lorretta Harp, MD;  Location: [place] [facility];  Service: Cardiovascular;  Laterality: Left;  sfa with dc balloon  . PERIPHERAL VASCULAR BALLOON ANGIOPLASTY Left 11/25/2018   Procedure: PERIPHERAL VASCULAR BALLOON ANGIOPLASTY;  Surgeon: Wellington Hampshire, MD;  Location: [place] [facility];  Service:  Cardiovascular;  Laterality: Left;  popliteal  . PERIPHERAL VASCULAR INTERVENTION Left 11/25/2018   Procedure: PERIPHERAL VASCULAR INTERVENTION;  Surgeon: Wellington Hampshire, MD;  Location: [place] [facility];  Service: Cardiovascular;  Laterality: Left;  tibial peroneal trunk and peroneal stents   . stent  06/14/2010   left leg  . STUMP REVISION Left 01/20/2019   Procedure: REVISION LEFT BELOW KNEE AMPUTATION;  Surgeon: [name], MD;  Location: [place];  Service: Orthopedics;  Laterality: Left;  . US ECHOCARDIOGRAPHY  01/21/2006   moderate mitral annular ca+, mild MR, AOV moderately sclerotic.    There were no vitals filed for this visit.   Subjective Assessment - 08/30/19 1139    Subjective Family continues to help with exercises & activities at home. Son & patient are questioning if PT will stop or continue after current plan of care that ends in 4 weeks.    Patient is accompained by: Family member   son, [name]   Pertinent History Bil TTAs, DM2, PAD, HTN, CHF, A-Fib, TIA, CKDst3    Patient Stated Goals walk with prostheses in home & in/out house. Son would like him to be able to toilet & get in/out bed by himself.    Currently in Pain? No/denies    Pain Onset In the past 7 days                             [facility] Adult PT Treatment/Exercise - 08/30/19 1140      Transfers   Transfers Sit to Stand;Stand to [employer] Pivot Transfers    Sit to Stand 3: Mod assist;With upper extremity assist;With armrests;From chair/3-in-1;Other (comment);4: Min assist   to RW modA or sink minA   Sit to Stand Details Manual facilitation for weight shifting;Verbal cues for safe use of DME/AE;Verbal cues for technique;Verbal cues for sequencing    Sit to Stand Details (indicate cue type and reason) verbal & tactile/manual cues with one hand on RW / sink & other pushing off w/c    Stand to Sit 4: Min assist;With upper extremity assist;With armrests;To  chair/3-in-1;Other (comment)   from RW or sink   Stand to Sit Details (indicate cue type and reason) Verbal cues for technique;Verbal cues for sequencing;Manual facilitation for weight shifting;Verbal cues for safe use of DME/AE    Stand Pivot Transfers 3: Mod assist   RW & TTA prostheses b/w w/c & chair w/armrests   Stand Pivot Transfer Details (indicate cue type and reason) PT verbal cues on safe assistance techniques.  Manual & verbal cues on movements.     Lateral/Scoot Transfers 4: Min guard;4: Min assist;With armrests removed;With slide board    Lateral/Scoot Transfer Details (indicate cue type and reason) tactile & verbal cues on weight shift & technique using fisted hands vs flat hand to push.       Ambulation/Gait   Ambulation/Gait Yes    Ambulation/Gait Assistance 3: Mod assist   Son assisting with PT for training.    Ambulation/Gait Assistance Details tactile & verbal  cues on weight shift over stance limb, upright posture & RW positioning.  PT verbal cues to son on safe assistance technique.     Ambulation Distance (Feet) 20 Feet   20' + 90* turn to sit   Assistive device Prostheses;Rolling walker    Gait Pattern Step-to pattern;Decreased step length - right;Decreased step length - left;Decreased hip/knee flexion - right;Decreased hip/knee flexion - left;Right foot flat;Left foot flat;Right flexed knee in stance;Left flexed knee in stance;Shuffle;Lateral hip instability;Trunk flexed;Poor foot clearance - left;Poor foot clearance - right    Ambulation Surface Level;Indoor      Therapeutic Activites    Other Therapeutic Activities standing at sink for 3 minutes with upright trunk, right /left and anterior / lateral weight shifts.  Standing prior to gait to stretch.       Knee/Hip Exercises: Aerobic   Nustep Level 6 BUEs & BLEs 9 min      Prosthetics   Current prosthetic wear tolerance (days/week)  daily    Current prosthetic wear tolerance (#hours/day)  most of awake hours - up to  6hrs without shrinker under liners.     Edema non-pitting    Residual limb condition  no open areas, normal color & temperature,                     PT Short Term Goals - 08/26/19 1318      PT SHORT TERM GOAL #1   Title Patient and family verbalize & demonstrate understanding of updated HEP.  (All updated STGs Target Date: 08/26/2019)    Baseline MET 08/26/2019    Time 4    Period Weeks    Status Achieved    Target Date 08/26/19      PT SHORT TERM GOAL #2   Title Patient ambulates 30' and turns 90* to position to sit in chair with minA.    Baseline MET 08/26/2019    Time 4    Period Weeks    Status Achieved    Target Date 08/26/19      PT SHORT TERM GOAL #3   Title Stand -pivot transfer with RW between w/c & chair with armrests with modA    Baseline MET 08/26/2019    Time 4    Period Weeks    Status Achieved    Target Date 08/26/19      PT SHORT TERM GOAL #4   Title Standing balance with walker support reaches to counter anterior to RW with minA    Baseline MET 08/26/2019    Time 4    Period Weeks    Status Achieved    Target Date 08/26/19             PT Long Term Goals - 08/03/19 1646      PT LONG TERM GOAL #1   Title Patient and family verbalize and demonstrate understanding of proper prosthetic care to enable safe utilization of prostheses. (All LTGs Target Date: 09/24/2019)    Time 3    Period Months    Status On-going    Target Date 09/24/19      PT LONG TERM GOAL #2   Title Patient tolerates wear of bilateral prostheses >90% of awake hours without skin issues or limb pain to enable functional potential during his day.    Time 3    Period Months    Status On-going    Target Date 09/24/19      PT LONG TERM GOAL #3   Title Squat  pivot transfer bed to w/c or chair to w/c with prostheses with supervision.    Time 3    Period Months    Status On-going    Target Date 09/24/19      PT LONG TERM GOAL #4   Title Sit to / from stand elevated w/c to  walker with prostheses with minA    Time 3    Period Months    Status On-going    Target Date 09/24/19      PT LONG TERM GOAL #5   Title Standing balance with walker support with bilateral prostheses static stance for 2 minutes, scanning environment and reaches 2" anteriorly with supervision.    Time 3    Period Months    Status On-going    Target Date 09/24/19      PT LONG TERM GOAL #6   Title Patient ambulates 25' between 2 chairs including turning 90* to position to sit with RW & prostheses with minA from family safely.    Time 3    Period Months    Status On-going    Target Date 09/24/19                 Plan - 08/30/19 1138    Clinical Impression Statement Patient's son is back caring for his father during the week and attending his PT sessions. He has not been to PT with his dad in >6 weeks. His son reports significant difference in functional level.  PT worked on standing at sink prior to gait & stand-pivot transfers. His son verbalizes better understanding on assisting his father.    Personal Factors and Comorbidities Age;Comorbidity 3+;Fitness;Time since onset of injury/illness/exacerbation    Comorbidities Bil TTAs, DM2, PAD, HTN, CHF, A-Fib, TIA, CKDst3,    Examination-Activity Limitations Locomotion Level;Sit;Stand;Toileting;Transfers    Examination-Participation Restrictions Community Activity    Stability/Clinical Decision Making Evolving/Moderate complexity    Rehab Potential Good    PT Frequency 2x / week    PT Duration 12 weeks   3 months   PT Treatment/Interventions ADLs/Self Care Home Management;DME Instruction;Gait training;Stair training;Functional mobility training;Therapeutic activities;Therapeutic exercise;Balance training;Neuromuscular re-education;Patient/family education;Prosthetic Training;Manual techniques;Passive range of motion;Vestibular    PT Next Visit Plan monitor HR with activity,   work towards LTGs,  Therapuetic exercise    Consulted and  Agree with Plan of Care Patient;Family member/caregiver    Family Member Consulted dtr, [name]           Patient will benefit from skilled therapeutic intervention in order to improve the following deficits and impairments:  Abnormal gait, Cardiopulmonary status limiting activity, Decreased activity tolerance, Decreased balance, Decreased endurance, Decreased knowledge of use of DME, Decreased mobility, Decreased range of motion, Decreased scar mobility, Decreased strength, Increased edema, Impaired flexibility, Impaired UE functional use, Postural dysfunction, Prosthetic Dependency, Pain  Visit Diagnosis: Other abnormalities of gait and mobility  Unsteadiness on feet  Abnormal posture  History of fall  Weakness generalized  Stiffness of right knee, not elsewhere classified  Stiffness of left knee, not elsewhere classified  Contracture of muscle, multiple sites  Localized edema     Problem List Patient Active Problem List   Diagnosis Date Noted  . Weakness generalized 04/05/2019  . S/P BKA (below knee amputation) unilateral, left ([place]) 02/15/2019  . Pressure injury of skin 02/03/2019  . TIA (transient ischemic attack) 02/03/2019  . Transient speech disturbance 02/02/2019  . Chronic indwelling Foley catheter 01/22/2019  . Postoperative wound infection 01/20/2019  . Normocytic anemia  01/18/2019  . Cellulitis 01/18/2019  . Infection of deep incisional surgical site after procedure   . Abnormal urinalysis   . Labile blood glucose   . Urinary retention   . S/P bilateral BKA (below knee amputation) ([place])   . Acute blood loss anemia   . Urinary retention due to benign prostatic hyperplasia 12/28/2018  . Unilateral complete BKA, left, initial encounter ([place]) 12/25/2018  . Dehiscence of amputation stump ([place])   . History of transmetatarsal amputation of left foot ([place]) 12/09/2018  . Critical limb ischemia with history of revascularization of same extremity   .  Gangrene of left foot ([place])   . Diabetic ulcer of toe of left foot associated with type 2 diabetes mellitus ([place])   . Cellulitis of left lower extremity   . Toe infection 11/18/2018  . Peripheral edema   . Acute on chronic diastolic CHF (congestive heart failure) ([place]) 11/09/2018  . S/P BKA (below knee amputation) unilateral, right ([place]) 10/05/2018  . Hyponatremia   . Essential hypertension   . Postoperative pain   . Pain of left heel   . Unable to maintain weight-bearing   . Type 2 diabetes mellitus with diabetic peripheral angiopathy with gangrene ([place])   . Anemia due to acute blood loss   . Chronic kidney disease, stage 3a   . Acquired absence of right leg below knee ([place]) 09/07/2018  . Gangrene of right foot ([place])   . DNR (do not resuscitate) discussion   . Goals of care, counseling/discussion   . Palliative care by specialist   . Type 2 diabetes mellitus with vascular disease ([place])   . Severe protein-calorie malnutrition ([place])   . Ischemic foot 08/20/2018  . Critical lower limb ischemia 08/03/2018  . Type 2 diabetes mellitus with foot ulcer, without long-term current use of insulin ([place])   . Gastroesophageal reflux disease   . Cellulitis of great toe of right foot 05/29/2018  . Atrial fibrillation, chronic   . Chest pain 07/25/2017  . PAF (paroxysmal atrial fibrillation) ([place]) 07/25/2017  . BPH (benign prostatic hyperplasia) 07/25/2017  . Shortness of breath 05/11/2017  . Hypothyroidism 05/11/2017  . Chronic diastolic CHF (congestive heart failure) ([place]) 05/11/2017  . Moderate aortic stenosis 06/12/2016  . RBBB 06/12/2016  . Peripheral vascular disease ([place]) 08/04/2012  . Essential hypertension, benign 08/04/2012  . Hyperlipidemia 08/04/2012  . Dizziness 08/04/2012    Jamey Reas PT, DPT 08/30/2019, 1:04 PM  [facility] Physical Therapy [address] [place], [place], [address] Phone: [phone]   Fax:  [phone]  Name: GIOVANNY DUGAL MRN: [id number] Date of Birth: 1927-02-26

## 2019-08-31 ENCOUNTER — Encounter: Payer: Medicare Other | Admitting: Physical Therapy

## 2019-09-01 ENCOUNTER — Ambulatory Visit: Payer: Medicare Other | Admitting: Physical Therapy

## 2019-09-01 ENCOUNTER — Encounter: Payer: Self-pay | Admitting: Physical Therapy

## 2019-09-01 ENCOUNTER — Other Ambulatory Visit: Payer: Self-pay

## 2019-09-01 DIAGNOSIS — M25661 Stiffness of right knee, not elsewhere classified: Secondary | ICD-10-CM | POA: Diagnosis not present

## 2019-09-01 DIAGNOSIS — R600 Localized edema: Secondary | ICD-10-CM

## 2019-09-01 DIAGNOSIS — R2681 Unsteadiness on feet: Secondary | ICD-10-CM

## 2019-09-01 DIAGNOSIS — M6281 Muscle weakness (generalized): Secondary | ICD-10-CM

## 2019-09-01 DIAGNOSIS — R293 Abnormal posture: Secondary | ICD-10-CM

## 2019-09-01 DIAGNOSIS — M6249 Contracture of muscle, multiple sites: Secondary | ICD-10-CM

## 2019-09-01 DIAGNOSIS — Z9181 History of falling: Secondary | ICD-10-CM

## 2019-09-01 DIAGNOSIS — R2689 Other abnormalities of gait and mobility: Secondary | ICD-10-CM | POA: Diagnosis not present

## 2019-09-01 DIAGNOSIS — M25662 Stiffness of left knee, not elsewhere classified: Secondary | ICD-10-CM

## 2019-09-01 NOTE — Therapy (Signed)
[facility] [address] [place], [place], [address] Phone: [phone]   Fax:  [phone]  Physical Therapy Treatment  Patient Details  Name: [name] MRN: [id number] Date of Birth: 1927-11-28 Referring Provider (PT): [name], MD   Encounter Date: 09/01/2019   PT End of Session - 09/01/19 1335    Visit Number 74    Number of Visits 50    Date for PT Re-Evaluation 09/26/19    Authorization Type [employer] Medicare    Authorization Time Period $30 co-pay    Progress Note Due on Visit 95    PT Start Time 1335    PT Stop Time 1425    PT Time Calculation (min) 50 min    Equipment Utilized During Treatment Gait belt    Activity Tolerance Patient tolerated treatment well    Behavior During Therapy WFL for tasks assessed/performed           Past Medical History:  Diagnosis Date  . Arthritis   . Borderline diabetic   . Diabetes mellitus without complication ([place])   . Glaucoma   . Hyperlipidemia   . Hypertension   . PVD (peripheral vascular disease) ([place])   . Sleep apnea    Cpap ordered but doesnt use  . Urinary retention due to benign prostatic hyperplasia 12/28/2018    Past Surgical History:  Procedure Laterality Date  . ABDOMINAL AORTOGRAM W/LOWER EXTREMITY N/A 07/27/2018   Procedure: ABDOMINAL AORTOGRAM W/LOWER EXTREMITY;  Surgeon: Lorretta Harp, MD;  Location: [place] [facility];  Service: Cardiovascular;  Laterality: N/A;  . AMPUTATION Right 08/28/2018   Procedure: RIGHT BELOW KNEE AMPUTATION;  Surgeon: [name], MD;  Location: [place];  Service: Orthopedics;  Laterality: Right;  . AMPUTATION Left 12/02/2018   Procedure: LEFT TRANSMETATARSAL AMPUTATION AND NEGATIVE PRESSURE WOUND VAC PLACEMENT;  Surgeon: [name], MD;  Location: [place];  Service: Orthopedics;  Laterality: Left;  . AMPUTATION Left 12/18/2018   Procedure: LEFT BELOW KNEE AMPUTATION;  Surgeon: [name], MD;  Location: [place];  Service:  Orthopedics;  Laterality: Left;  . APPENDECTOMY  1943  . BELOW KNEE LEG AMPUTATION Left 12/18/2018  . CATARACT EXTRACTION  2012   x2  . COLONOSCOPY N/A 11/01/2014   Procedure: COLONOSCOPY;  Surgeon: Aviva Signs, MD;  [facility];  Service: Gastroenterology;  Laterality: N/A;  . ESOPHAGOGASTRODUODENOSCOPY N/A 11/01/2014   Procedure: ESOPHAGOGASTRODUODENOSCOPY (EGD);  Surgeon: Aviva Signs, MD;  [facility];  Service: Gastroenterology;  Laterality: N/A;  . HERNIA REPAIR Left   . INGUINAL HERNIA REPAIR Right 03/01/2013   Procedure: RIGHT INGUINAL HERNIORRHAPHY;  Surgeon: Jamesetta So, MD;  [facility];  Service: General;  Laterality: Right;  . INSERTION OF MESH Right 03/01/2013   Procedure: INSERTION OF MESH;  Surgeon: Jamesetta So, MD;  [facility];  Service: General;  Laterality: Right;  . LOWER EXTREMITY ANGIOGRAPHY Right 08/25/2018   Procedure: LOWER EXTREMITY ANGIOGRAPHY;  Surgeon: Wellington Hampshire, MD;  Location: [place] [facility];  Service: Cardiovascular;  Laterality: Right;  . LOWER EXTREMITY ANGIOGRAPHY N/A 11/25/2018   Procedure: LOWER EXTREMITY ANGIOGRAPHY;  Surgeon: Wellington Hampshire, MD;  Location: [place] [facility];  Service: Cardiovascular;  Laterality: N/A;  . NM MYOCAR PERF WALL MOTION  06/05/2010   no significant ischemia  . PERIPHERAL VASCULAR ATHERECTOMY Right 08/03/2018   Procedure: PERIPHERAL VASCULAR ATHERECTOMY;  Surgeon: Lorretta Harp, MD;  Location: [place] [facility];  Service:  Cardiovascular;  Laterality: Right;  . PERIPHERAL VASCULAR ATHERECTOMY Left 11/23/2018   Procedure: PERIPHERAL VASCULAR ATHERECTOMY;  Surgeon: Lorretta Harp, MD;  Location: [place] [facility];  Service: Cardiovascular;  Laterality: Left;  sfa with dc balloon  . PERIPHERAL VASCULAR BALLOON ANGIOPLASTY Left 11/25/2018   Procedure: PERIPHERAL VASCULAR BALLOON ANGIOPLASTY;  Surgeon: Wellington Hampshire, MD;  Location: [place] [facility];  Service:  Cardiovascular;  Laterality: Left;  popliteal  . PERIPHERAL VASCULAR INTERVENTION Left 11/25/2018   Procedure: PERIPHERAL VASCULAR INTERVENTION;  Surgeon: Wellington Hampshire, MD;  Location: [place] [facility];  Service: Cardiovascular;  Laterality: Left;  tibial peroneal trunk and peroneal stents   . stent  06/14/2010   left leg  . STUMP REVISION Left 01/20/2019   Procedure: REVISION LEFT BELOW KNEE AMPUTATION;  Surgeon: [name], MD;  Location: [place];  Service: Orthopedics;  Laterality: Left;  . US ECHOCARDIOGRAPHY  01/21/2006   moderate mitral annular ca+, mild MR, AOV moderately sclerotic.    There were no vitals filed for this visit.   Subjective Assessment - 09/01/19 1336    Subjective His son [name] helped with standing at sink for 2 minutes and sit to/from stand using 1 hand to push & other hand pull on sink.    Patient is accompained by: Family member   son, Finnis Colee   Pertinent History Bil TTAs, DM2, PAD, HTN, CHF, A-Fib, TIA, CKDst3    Patient Stated Goals walk with prostheses in home & in/out house. Son would like him to be able to toilet & get in/out bed by himself.    Currently in Pain? No/denies    Pain Onset In the past 7 days                             [facility] Adult PT Treatment/Exercise - 09/01/19 1345      Transfers   Transfers Sit to Stand;Stand to Sit;Stand Pivot Transfers;Squat Pivot Transfers    Sit to Stand 3: Mod assist;With upper extremity assist;With armrests;From chair/3-in-1;Other (comment);4: Min assist   to RW modA or sink minA   Sit to Stand Details Manual facilitation for weight shifting;Verbal cues for safe use of DME/AE;Verbal cues for technique;Verbal cues for sequencing    Stand to Sit 4: Min assist;With upper extremity assist;With armrests;To chair/3-in-1;Other (comment)   from RW or sink   Stand to Sit Details (indicate cue type and reason) Verbal cues for technique;Verbal cues for sequencing;Manual facilitation for weight  shifting;Verbal cues for safe use of DME/AE    Stand Pivot Transfers 3: Mod assist;4: Min assist    Stand Pivot Transfer Details (indicate cue type and reason) PT instructed pt & son in 7* stand pivot at sink to 2nd w/c - pulling to stand at sink then using sink & chair back support to turn 90* to 2nd w/c - 1 rep to right & 1 rep to left      Ambulation/Gait   Ambulation/Gait Yes    Ambulation/Gait Assistance 3: Mod assist   Son assisting with PT for training.    Ambulation/Gait Assistance Details manual / tactile cues on upright posture & wt shift to stance limb    Ambulation Distance (Feet) 35 Feet   25' + 90* turn to sit and 35' straight path   Assistive device Prostheses;Rolling walker    Gait Pattern Step-to pattern;Decreased step length - right;Decreased step length - left;Decreased hip/knee flexion - right;Decreased hip/knee  flexion - left;Right foot flat;Left foot flat;Right flexed knee in stance;Left flexed knee in stance;Shuffle;Lateral hip instability;Trunk flexed;Poor foot clearance - left;Poor foot clearance - right    Ambulation Surface Level;Indoor      Therapeutic Activites    Other Therapeutic Activities standing at sink for 3 minutes with upright trunk, right /left and anterior / lateral weight shifts.  Standing prior to gait to stretch.       Prosthetics   Prosthetic Care Comments  checking height of liners above knee to determine if they have slid down.  Sitting without prostheses supported on floor can facilitate slight slippage which in turn decreases stability or control.     Current prosthetic wear tolerance (days/week)  daily    Current prosthetic wear tolerance (#hours/day)  most of awake hours - up to 6hrs without shrinker under liners.     Edema non-pitting    Residual limb condition  no open areas, normal color & temperature,     Education Provided Other (comment)   see prosthetic care comments   Person(s) Educated Patient;Child(ren)    Education Method  Explanation;Verbal cues;Demonstration    Education Method Verbalized understanding;Needs further instruction                    PT Short Term Goals - 08/26/19 1318      PT SHORT TERM GOAL #1   Title Patient and family verbalize & demonstrate understanding of updated HEP.  (All updated STGs Target Date: 08/26/2019)    Baseline MET 08/26/2019    Time 4    Period Weeks    Status Achieved    Target Date 08/26/19      PT SHORT TERM GOAL #2   Title Patient ambulates 30' and turns 90* to position to sit in chair with minA.    Baseline MET 08/26/2019    Time 4    Period Weeks    Status Achieved    Target Date 08/26/19      PT SHORT TERM GOAL #3   Title Stand -pivot transfer with RW between w/c & chair with armrests with modA    Baseline MET 08/26/2019    Time 4    Period Weeks    Status Achieved    Target Date 08/26/19      PT SHORT TERM GOAL #4   Title Standing balance with walker support reaches to counter anterior to RW with minA    Baseline MET 08/26/2019    Time 4    Period Weeks    Status Achieved    Target Date 08/26/19             PT Long Term Goals - 08/03/19 1646      PT LONG TERM GOAL #1   Title Patient and family verbalize and demonstrate understanding of proper prosthetic care to enable safe utilization of prostheses. (All LTGs Target Date: 09/24/2019)    Time 3    Period Months    Status On-going    Target Date 09/24/19      PT LONG TERM GOAL #2   Title Patient tolerates wear of bilateral prostheses >90% of awake hours without skin issues or limb pain to enable functional potential during his day.    Time 3    Period Months    Status On-going    Target Date 09/24/19      PT LONG TERM GOAL #3   Title Squat pivot transfer bed to w/c or chair to w/c with  prostheses with supervision.    Time 3    Period Months    Status On-going    Target Date 09/24/19      PT LONG TERM GOAL #4   Title Sit to / from stand elevated w/c to walker with prostheses  with minA    Time 3    Period Months    Status On-going    Target Date 09/24/19      PT LONG TERM GOAL #5   Title Standing balance with walker support with bilateral prostheses static stance for 2 minutes, scanning environment and reaches 2" anteriorly with supervision.    Time 3    Period Months    Status On-going    Target Date 09/24/19      PT LONG TERM GOAL #6   Title Patient ambulates 25' between 2 chairs including turning 90* to position to sit with RW & prostheses with minA from family safely.    Time 3    Period Months    Status On-going    Target Date 09/24/19                 Plan - 09/01/19 1335    Clinical Impression Statement PT instructed son in how to work on stand pivot transfers 34* at sink to 2nd w/c and verbalizes understanding.  Patient is making slow progress as anticipated with his age.    Personal Factors and Comorbidities Age;Comorbidity 3+;Fitness;Time since onset of injury/illness/exacerbation    Comorbidities Bil TTAs, DM2, PAD, HTN, CHF, A-Fib, TIA, CKDst3,    Examination-Activity Limitations Locomotion Level;Sit;Stand;Toileting;Transfers    Examination-Participation Restrictions Community Activity    Stability/Clinical Decision Making Evolving/Moderate complexity    Rehab Potential Good    PT Frequency 2x / week    PT Duration 12 weeks   3 months   PT Treatment/Interventions ADLs/Self Care Home Management;DME Instruction;Gait training;Stair training;Functional mobility training;Therapeutic activities;Therapeutic exercise;Balance training;Neuromuscular re-education;Patient/family education;Prosthetic Training;Manual techniques;Passive range of motion;Vestibular    PT Next Visit Plan monitor HR with activity,   work towards LTGs,  Therapuetic exercise    Consulted and Agree with Plan of Care Patient;Family member/caregiver    Family Member Consulted dtr, [name]           Patient will benefit from skilled therapeutic intervention in order  to improve the following deficits and impairments:  Abnormal gait, Cardiopulmonary status limiting activity, Decreased activity tolerance, Decreased balance, Decreased endurance, Decreased knowledge of use of DME, Decreased mobility, Decreased range of motion, Decreased scar mobility, Decreased strength, Increased edema, Impaired flexibility, Impaired UE functional use, Postural dysfunction, Prosthetic Dependency, Pain  Visit Diagnosis: Other abnormalities of gait and mobility  Unsteadiness on feet  Abnormal posture  History of fall  Stiffness of right knee, not elsewhere classified  Stiffness of left knee, not elsewhere classified  Contracture of muscle, multiple sites  Localized edema  Muscle weakness (generalized)     Problem List Patient Active Problem List   Diagnosis Date Noted  . Weakness generalized 04/05/2019  . S/P BKA (below knee amputation) unilateral, left ([place]) 02/15/2019  . Pressure injury of skin 02/03/2019  . TIA (transient ischemic attack) 02/03/2019  . Transient speech disturbance 02/02/2019  . Chronic indwelling Foley catheter 01/22/2019  . Postoperative wound infection 01/20/2019  . Normocytic anemia 01/18/2019  . Cellulitis 01/18/2019  . Infection of deep incisional surgical site after procedure   . Abnormal urinalysis   . Labile blood glucose   . Urinary retention   . S/P bilateral  BKA (below knee amputation) ([place])   . Acute blood loss anemia   . Urinary retention due to benign prostatic hyperplasia 12/28/2018  . Unilateral complete BKA, left, initial encounter ([place]) 12/25/2018  . Dehiscence of amputation stump ([place])   . History of transmetatarsal amputation of left foot ([place]) 12/09/2018  . Critical limb ischemia with history of revascularization of same extremity   . Gangrene of left foot ([place])   . Diabetic ulcer of toe of left foot associated with type 2 diabetes mellitus ([place])   . Cellulitis of left lower extremity   . Toe infection  11/18/2018  . Peripheral edema   . Acute on chronic diastolic CHF (congestive heart failure) ([place]) 11/09/2018  . S/P BKA (below knee amputation) unilateral, right ([place]) 10/05/2018  . Hyponatremia   . Essential hypertension   . Postoperative pain   . Pain of left heel   . Unable to maintain weight-bearing   . Type 2 diabetes mellitus with diabetic peripheral angiopathy with gangrene ([place])   . Anemia due to acute blood loss   . Chronic kidney disease, stage 3a   . Acquired absence of right leg below knee ([place]) 09/07/2018  . Gangrene of right foot ([place])   . DNR (do not resuscitate) discussion   . Goals of care, counseling/discussion   . Palliative care by specialist   . Type 2 diabetes mellitus with vascular disease ([place])   . Severe protein-calorie malnutrition ([place])   . Ischemic foot 08/20/2018  . Critical lower limb ischemia 08/03/2018  . Type 2 diabetes mellitus with foot ulcer, without long-term current use of insulin ([place])   . Gastroesophageal reflux disease   . Cellulitis of great toe of right foot 05/29/2018  . Atrial fibrillation, chronic   . Chest pain 07/25/2017  . PAF (paroxysmal atrial fibrillation) ([place]) 07/25/2017  . BPH (benign prostatic hyperplasia) 07/25/2017  . Shortness of breath 05/11/2017  . Hypothyroidism 05/11/2017  . Chronic diastolic CHF (congestive heart failure) ([place]) 05/11/2017  . Moderate aortic stenosis 06/12/2016  . RBBB 06/12/2016  . Peripheral vascular disease ([place]) 08/04/2012  . Essential hypertension, benign 08/04/2012  . Hyperlipidemia 08/04/2012  . Dizziness 08/04/2012    Jamey Reas PT, DPT 09/01/2019, 2:43 PM  [facility] [address] [place], [place], [address] Phone: [phone]   Fax:  [phone]  Name: [name] MRN: [id number] Date of Birth: 06/30/1927

## 2019-09-02 ENCOUNTER — Encounter: Payer: Medicare Other | Admitting: Physical Therapy

## 2019-09-02 ENCOUNTER — Encounter: Payer: Self-pay | Admitting: Internal Medicine

## 2019-09-02 ENCOUNTER — Ambulatory Visit: Payer: Medicare Other | Admitting: Internal Medicine

## 2019-09-02 VITALS — BP 160/82 | HR 61 | Ht 70.0 in | Wt 216.8 lb

## 2019-09-02 DIAGNOSIS — I480 Paroxysmal atrial fibrillation: Secondary | ICD-10-CM

## 2019-09-02 DIAGNOSIS — I350 Nonrheumatic aortic (valve) stenosis: Secondary | ICD-10-CM

## 2019-09-02 DIAGNOSIS — I739 Peripheral vascular disease, unspecified: Secondary | ICD-10-CM

## 2019-09-02 NOTE — Progress Notes (Signed)
OFFICE NOTE  Chief Complaint:  Follow-up  Primary Care Physician: Redmond School, MD  HPI:  [name]  is an 86 year old gentleman with history of hypertension, dyslipidemia and peripheral vascular disease. He did have claudication and in May 2012 he had high-grade mid SFA stenosis. He had a stent placed in his SFA which improved his claudication and his Doppler studies. In December 2012 his left ABI was 0.61, the right ABI 0.74 with a patent stent. He was seen back by Dr. Gwenlyn Found and recommended followup with me in the office today. His only complaints now are about occasional dizziness especially with change in position, also leg pain with walking but not claudication, mostly joint pain, hip pain which could represent arthritis, wearing glasses.   I the pleasure seeing [name] back today. It's been over a year and a half since I saw him last in the office. He reports doing fairly well and denies any chest pain or worsening shortness of breath. He denies any claudication in his legs. Blood pressure is noted to be elevated somewhat today. He is previous film lisinopril and this was stopped for unknown reasons. Not on aspirin but continues to take Plavix for his PAD.  [name] returns today for follow-up. Overall he is doing fairly well though has complaints about particular right hip pain and knee pain. He also reports some swelling in his legs gets worse during the day but improves with elevating them overnight. He says he gets some numbness and tingling in his feet as well. He had arterial Dopplers last year which show stable ABIs and no evidence of stent occlusion. He is due for repeat arterial Dopplers in April of this year. He denies any chest pain or worsening shortness of breath.  06/12/2016  [name] returns today for follow-up. Recently he saw his nephrologist to auscultated a systolic heart murmur. An echocardiogram was ordered at [facility]. This demonstrated an EF  of 60-65%, there was mild to moderate aortic stenosis with a mean gradient of 15 mmHg and calculated valve area of 1.3-1.4 cm. He denies chest pain, worsening shortness of breath, presyncope, syncope or congestive heart failure symptoms. His only other complaint today is bilateral knee pain. He has a history of knee surgery and has also been having some right hip pain. Blood pressure is at goal. He is on atorvastatin 40 mg daily followed by his primary care provider. He has a history of PAD with lower extremity arterial Dopplers in April 2017 that showed no obstructive PAD and a patent stent.  05/09/2017  [name] returns today for follow-up.  He is reporting some mild shortness of breath as well as lower extremity edema.  According to records he is gained 6 pounds since we last saw him.  He reports worsening lower extremity swelling.  He was scheduled to have a routine echocardiogram on May 30 and office visit with me on May 31, however came in earlier because of worsening shortness of breath.  EKG just shows sinus rhythm with first-degree AV block at 63, RBBB-personally reviewed.  His daughter was accompanying him today and noted that he has been out of several of his medications.  Namely he has not taken tamsulosin, finasteride and levothyroxine.  He is also out of his hydrochlorothiazide diuretic.  06/13/2017  [name] returns today for follow-up.  He had an echocardiogram yesterday which showed LVEF of 65-705%, however he now has moderate aortic stenosis, increased from mild stenosis a year  ago.  He reports marked improvement in his edema although weight is about a pound heavier than it was when I last saw him a month ago.  He is on Lasix now.  Creatinine is slightly higher on the Lasix at 1.6, but may be accepted due to his improvement in edema.  08/11/2017  [name] was seen today in follow-up.  Unfortunately was hospitalized in June with chest heaviness.  He was seen by Dr. Harl Bowie in consultation  who felt that his chest pain was atypical for cardiac pain but was noted to be a new onset atrial fibrillation.  Fortunately spontaneously converted back to sinus however was appropriately switched to Eliquis from Plavix.  He denies any recurrent A. fib since then has had no further chest discomfort.  I suspect he felt unwell either due to the A. fib or as a combination of A. fib and moderate aortic stenosis.  He is scheduled ready for a repeat echocardiogram in November and follow-up with me afterwards.  12/09/2017  [name] is seen today for follow-up of her recent echo.  This was a repeat study to assess any changes in aortic stenosis.  His last study was over 6 months ago.  This demonstrates moderate aortic stenosis with a mean gradient of 26 mmHg, from 21 mmHg previously.  He is asymptomatic with this and denies any recurrent atrial fibrillation.  EKG today shows sinus rhythm.  He is compliant with Eliquis and denies any bleeding problems.  Blood pressures been well controlled at home.  01/25/2019  [name] returns today for follow-up.  Unfortunately he has had a very difficult past year.  He has now undergone bilateral BKA's with recurrent issues due to critical limb ischemia.  He is essentially wheelchair-bound although does have a prosthesis on the right side.  He was noted to have moderate aortic stenosis by echo this past August and will need repeat echo in 6 to 8 months.  Other than struggles with his legs and nonhealing wounds, he denies any chest pain or worsening shortness of breath.  05/05/2019  [name] is seen today in follow-up.  He is accompanied by his son.  He still has difficulty walking on his BKA's.  More recently he has had some worsening edema, particularly upper extremities and his thighs.  He was seen by Dr. Gerarda Fraction, who noted he was markedly bradycardic.  EKG showed possibly a junctional rhythm with PVC at a rate in the upper 40s.  In general [name] runs a heart rate in the mid  26s.  Additionally, he was thought to be in critical aortic stenosis with associated congestive heart failure.  I had just seen [name] in January this year and noted that his repeat echo shows moderate to severe aortic stenosis with a mean gradient of just over 30 mmHg.  09/02/2019  [name] is seen today in follow-up.  He just had a repeat echo which actually shows a lower aortic valve gradient at 27 mmHg.  I suspect this might be under sampling as it is unlikely his aortic valve is improved.  He was seen in the multidisciplinary heart valve clinic by Dr. Angelena Form earlier this year and has been recommended to follow-up in September or October.  At this point does not appear that he needs urgent TAVR.  He also recently had an in office prostate procedure under nitrous oxide.  He tolerated this well after receiving cardiac clearance from me.  PMHx:  Past Medical History:  Diagnosis Date  . Arthritis   . Borderline diabetic   . Diabetes mellitus without complication ([place])   . Glaucoma   . Hyperlipidemia   . Hypertension   . PVD (peripheral vascular disease) ([place])   . Sleep apnea    Cpap ordered but doesnt use  . Urinary retention due to benign prostatic hyperplasia 12/28/2018    Past Surgical History:  Procedure Laterality Date  . ABDOMINAL AORTOGRAM W/LOWER EXTREMITY N/A 07/27/2018   Procedure: ABDOMINAL AORTOGRAM W/LOWER EXTREMITY;  Surgeon: Lorretta Harp, MD;  Location: [place] [facility];  Service: Cardiovascular;  Laterality: N/A;  . AMPUTATION Right 08/28/2018   Procedure: RIGHT BELOW KNEE AMPUTATION;  Surgeon: [name], MD;  Location: [place];  Service: Orthopedics;  Laterality: Right;  . AMPUTATION Left 12/02/2018   Procedure: LEFT TRANSMETATARSAL AMPUTATION AND NEGATIVE PRESSURE WOUND VAC PLACEMENT;  Surgeon: [name], MD;  Location: [place];  Service: Orthopedics;  Laterality: Left;  . AMPUTATION Left 12/18/2018   Procedure: LEFT BELOW KNEE AMPUTATION;  Surgeon: [name], MD;  Location: [place];  Service: Orthopedics;  Laterality: Left;  . APPENDECTOMY  1943  . BELOW KNEE LEG AMPUTATION Left 12/18/2018  . CATARACT EXTRACTION  2012   x2  . COLONOSCOPY N/A 11/01/2014   Procedure: COLONOSCOPY;  Surgeon: Aviva Signs, MD;  [facility];  Service: Gastroenterology;  Laterality: N/A;  . ESOPHAGOGASTRODUODENOSCOPY N/A 11/01/2014   Procedure: ESOPHAGOGASTRODUODENOSCOPY (EGD);  Surgeon: Aviva Signs, MD;  [facility];  Service: Gastroenterology;  Laterality: N/A;  . HERNIA REPAIR Left   . INGUINAL HERNIA REPAIR Right 03/01/2013   Procedure: RIGHT INGUINAL HERNIORRHAPHY;  Surgeon: Jamesetta So, MD;  Location: [facility];  Service: General;  Laterality: Right;  . INSERTION OF MESH Right 03/01/2013   Procedure: INSERTION OF MESH;  Surgeon: Jamesetta So, MD;  [facility];  Service: General;  Laterality: Right;  . LOWER EXTREMITY ANGIOGRAPHY Right 08/25/2018   Procedure: LOWER EXTREMITY ANGIOGRAPHY;  Surgeon: Wellington Hampshire, MD;  Location: [place] [facility];  Service: Cardiovascular;  Laterality: Right;  . LOWER EXTREMITY ANGIOGRAPHY N/A 11/25/2018   Procedure: LOWER EXTREMITY ANGIOGRAPHY;  Surgeon: Wellington Hampshire, MD;  Location: [place] [facility];  Service: Cardiovascular;  Laterality: N/A;  . NM MYOCAR PERF WALL MOTION  06/05/2010   no significant ischemia  . PERIPHERAL VASCULAR ATHERECTOMY Right 08/03/2018   Procedure: PERIPHERAL VASCULAR ATHERECTOMY;  Surgeon: Lorretta Harp, MD;  Location: [place] [facility];  Service: Cardiovascular;  Laterality: Right;  . PERIPHERAL VASCULAR ATHERECTOMY Left 11/23/2018   Procedure: PERIPHERAL VASCULAR ATHERECTOMY;  Surgeon: Lorretta Harp, MD;  Location: [place] [facility];  Service: Cardiovascular;  Laterality: Left;  sfa with dc balloon  . PERIPHERAL VASCULAR BALLOON ANGIOPLASTY Left 11/25/2018   Procedure: PERIPHERAL VASCULAR BALLOON ANGIOPLASTY;  Surgeon: Wellington Hampshire, MD;   Location: [place] [facility];  Service: Cardiovascular;  Laterality: Left;  popliteal  . PERIPHERAL VASCULAR INTERVENTION Left 11/25/2018   Procedure: PERIPHERAL VASCULAR INTERVENTION;  Surgeon: Wellington Hampshire, MD;  Location: [place] [facility];  Service: Cardiovascular;  Laterality: Left;  tibial peroneal trunk and peroneal stents   . stent  06/14/2010   left leg  . STUMP REVISION Left 01/20/2019   Procedure: REVISION LEFT BELOW KNEE AMPUTATION;  Surgeon: [name], MD;  Location: [place];  Service: Orthopedics;  Laterality: Left;  . US ECHOCARDIOGRAPHY  01/21/2006   moderate mitral annular ca+,  mild MR, AOV moderately sclerotic.    FAMHx:  Family History  Problem Relation Age of Onset  . Cancer Mother   . Heart attack Father   . Stroke Father   . Heart attack Brother   . Heart attack Brother     SOCHx:   reports that he quit smoking about 54 years ago. He has quit using smokeless tobacco.  His smokeless tobacco use included chew. He reports that he does not drink alcohol and does not use drugs.  ALLERGIES:  Allergies  Allergen Reactions  . Iodinated Diagnostic Agents     Other reaction(s): Fever  . Dye Fdc Red [Red Dye] Hives    Tolerated red Docusate, Niferex    ROS: Pertinent items noted in HPI and remainder of comprehensive ROS otherwise negative.  HOME MEDS: Current Outpatient Medications  Medication Sig Dispense Refill  . amLODipine (NORVASC) 2.5 MG tablet Take 2.5 mg by mouth daily.     [name] apixaban (ELIQUIS) 2.5 MG TABS tablet Take 2.5 mg by mouth 2 (two) times daily.    [name] aspirin EC 81 MG tablet Take 81 mg by mouth daily.    [name] atorvastatin (LIPITOR) 40 MG tablet TAKE 1 TABLET BY MOUTH EVERY DAY 90 tablet 1  . cephALEXin (KEFLEX) 250 MG capsule Take 250 mg by mouth daily.    . clopidogrel (PLAVIX) 75 MG tablet Take 1 tablet (75 mg total) by mouth daily with breakfast.    . diclofenac Sodium (VOLTAREN) 1 % GEL Apply 2 g topically 4 (four) times daily. 350 g 0  .  dorzolamide-timolol (COSOPT) 22.3-6.8 MG/ML ophthalmic solution Place 1 drop into both eyes 2 (two) times daily.    . famotidine (PEPCID) 20 MG tablet Take 1 tablet (20 mg total) by mouth daily. 60 tablet 0  . ferrous sulfate 325 (65 FE) MG tablet Take 325 mg by mouth 2 (two) times daily with a meal.    . finasteride (PROSCAR) 5 MG tablet Take 1 tablet (5 mg total) by mouth every evening. 30 tablet 0  . furosemide (LASIX) 40 MG tablet Take 40mg  by mouth in the morning and 20mg  by mouth in the evening.    [name] glipiZIDE (GLUCOTROL) 5 MG tablet Take 5 mg by mouth 2 (two) times daily.     [name] levothyroxine (SYNTHROID) 75 MCG tablet Take 75 mcg by mouth daily.    . methocarbamol (ROBAXIN) 500 MG tablet Take 1 tablet (500 mg total) by mouth every 6 (six) hours as needed for muscle spasms. 30 tablet 0  . Misc Natural Products (GLUCOSAMINE CHOND MSM FORMULA PO) Take 1 tablet by mouth 2 (two) times daily.    . Omega-3 Fatty Acids (FISH OIL PO) Take 1 capsule by mouth daily.    Glory Rosebush ULTRA test strip 1 each 2 (two) times daily.    . polyethylene glycol (MIRALAX) 17 g packet Take 17 g by mouth daily as needed for moderate constipation. 14 each 0  . potassium chloride (KLOR-CON) 10 MEQ tablet Take 1 tablet (10 mEq total) by mouth daily. 30 tablet 1  . pyridoxine (B-6) 100 MG tablet Take 100 mg by mouth daily.    . tamsulosin (FLOMAX) 0.4 MG CAPS capsule Take 1 capsule (0.4 mg total) by mouth 2 (two) times daily. 60 capsule 0  . traMADol (ULTRAM) 50 MG tablet Take 1 tablet (50 mg total) by mouth every 6 (six) hours as needed. 60 tablet 0  . vitamin C (ASCORBIC ACID) 500 MG tablet  Take 500 mg by mouth daily.    . vitamin E 400 UNIT capsule Take 400 Units by mouth daily.     No current facility-administered medications for this visit.    LABS/IMAGING: No results found for this or any previous visit (from the past 48 hour(s)). No results found.  VITALS: BP (!) 160/82   Pulse 61   Ht 5\' 10"  (1.778 m)    Wt 216 lb 12.8 oz (98.3 kg)   BMI 31.11 kg/m   EXAM: General appearance: alert and no distress Lungs: clear to auscultation bilaterally Heart: regular rate and rhythm, S1, S2 normal and systolic murmur: systolic ejection 3/6, crescendo at 2nd right intercostal space Extremities: bilateral BKA's Skin: Skin color, texture, turgor normal. No rashes or lesions Neurologic: Mental status: Alert, oriented, thought content appropriate Psych: Pleasant  EKG: Sinus rhythm first-degree AV block at 61, RBBB-personally reviewed  ASSESSMENT: 1. Chronic diastolic congestive heart failure 2. Recent PAF - CHADSVASC score of 5 (on Eliquis) 3. Moderate to severe aortic stenosis (01/2019) 4. New RBBB/bifascicular block 5. PAD status post Bilateral BKA 6. Hypertension- controlled 7. Dizziness 8. Dyslipidemia 9. Asymptomatic bradycardia   PLAN: 1.   [name] seems to be stable without any worsening diastolic congestive heart failure today.  Somehow were able to weigh him at 216 pounds which may not be completely accurate but is up from 209 in May.  He is in sinus rhythm today.  The echo shows no worsening aortic stenosis.  He should have follow-up with the multidisciplinary valve clinic.  He had a successful prostate procedure in the office.  No changes to his medicines today.  Plan follow-up with me in 6 months or sooner as necessary.  Pixie Casino, MD, [facility], [place] Director of the Advanced Lipid Disorders &  Cardiovascular Risk Reduction Clinic Diplomate of the American Board of Clinical Lipidology Attending Cardiologist  Direct Dial: [phone]  Fax: [phone]  Website:  [email or web address] Rolando Whitby 09/02/2019, 1:44 PM

## 2019-09-02 NOTE — Patient Instructions (Signed)
Medication Instructions:  Your physician recommends that you continue on your current medications as directed. Please refer to the Current Medication list given to you today.  *If you need a refill on your cardiac medications before your next appointment, please call your pharmacy*   Follow-Up: At [facility], you and your health needs are our priority.  As part of our continuing mission to provide you with exceptional heart care, we have created designated Provider Care Teams.  These Care Teams include your primary Cardiologist (physician) and Advanced Practice Providers (APPs -  Physician Assistants and Nurse Practitioners) who all work together to provide you with the care you need, when you need it.  We recommend signing up for the patient portal called "MyChart".  Sign up information is provided on this After Visit Summary.  MyChart is used to connect with patients for Virtual Visits (Telemedicine).  Patients are able to view lab/test results, encounter notes, upcoming appointments, etc.  Non-urgent messages can be sent to your provider as well.   To learn more about what you can do with MyChart, go to [email or web address].    Your next appointment:   6 month(s)  The format for your next appointment:   In Person  Provider:   You may see Pixie Casino, MD or one of the following Advanced Practice Providers on your designated Care Team:    Almyra Deforest, PA-C  Fabian Sharp, PA-C or   Roby Lofts, [place]    Other Instructions  You are also due for a valve clinic follow up with Dr. Angelena Form

## 2019-09-06 ENCOUNTER — Other Ambulatory Visit: Payer: Self-pay

## 2019-09-06 ENCOUNTER — Ambulatory Visit: Payer: Medicare Other | Admitting: Physical Therapy

## 2019-09-06 ENCOUNTER — Encounter: Payer: Self-pay | Admitting: Physical Therapy

## 2019-09-06 VITALS — Wt 223.6 lb

## 2019-09-06 DIAGNOSIS — M6249 Contracture of muscle, multiple sites: Secondary | ICD-10-CM

## 2019-09-06 DIAGNOSIS — R2681 Unsteadiness on feet: Secondary | ICD-10-CM | POA: Diagnosis not present

## 2019-09-06 DIAGNOSIS — R293 Abnormal posture: Secondary | ICD-10-CM

## 2019-09-06 DIAGNOSIS — M25661 Stiffness of right knee, not elsewhere classified: Secondary | ICD-10-CM | POA: Diagnosis not present

## 2019-09-06 DIAGNOSIS — R600 Localized edema: Secondary | ICD-10-CM

## 2019-09-06 DIAGNOSIS — M25662 Stiffness of left knee, not elsewhere classified: Secondary | ICD-10-CM

## 2019-09-06 DIAGNOSIS — Z9181 History of falling: Secondary | ICD-10-CM

## 2019-09-06 DIAGNOSIS — R2689 Other abnormalities of gait and mobility: Secondary | ICD-10-CM | POA: Diagnosis not present

## 2019-09-06 NOTE — Therapy (Signed)
[facility] [address] [place], [place], [address] Phone: [phone]   Fax:  [phone]  Physical Therapy Treatment  Patient Details  Name: [name] MRN: [id number] Date of Birth: 02/13/1927 Referring Provider (PT): Travis Score, MD   Encounter Date: 09/06/2019   PT End of Session - 09/06/19 1129    Visit Number 45    Number of Visits 50    Date for PT Re-Evaluation 09/26/19    Authorization Type [employer] Medicare    Authorization Time Period $30 co-pay    Progress Note Due on Visit 38    PT Start Time 1135    PT Stop Time 1218    PT Time Calculation (min) 43 min    Equipment Utilized During Treatment Gait belt    Activity Tolerance Patient tolerated treatment well    Behavior During Therapy [facility] for tasks assessed/performed           Past Medical History:  Diagnosis Date   Arthritis    Borderline diabetic    Diabetes mellitus without complication ([place])    Glaucoma    Hyperlipidemia    Hypertension    PVD (peripheral vascular disease) ([place])    Sleep apnea    Cpap ordered but doesnt use   Urinary retention due to benign prostatic hyperplasia 12/28/2018    Past Surgical History:  Procedure Laterality Date   ABDOMINAL AORTOGRAM W/LOWER EXTREMITY N/A 07/27/2018   Procedure: ABDOMINAL AORTOGRAM W/LOWER EXTREMITY;  Surgeon: Travis Harp, MD;  Location: [place] [facility];  Service: Cardiovascular;  Laterality: N/A;   AMPUTATION Right 08/28/2018   Procedure: RIGHT BELOW KNEE AMPUTATION;  Surgeon: Travis Minion, MD;  Location: [place];  Service: Orthopedics;  Laterality: Right;   AMPUTATION Left 12/02/2018   Procedure: LEFT TRANSMETATARSAL AMPUTATION AND NEGATIVE PRESSURE WOUND VAC PLACEMENT;  Surgeon: Travis Minion, MD;  Location: [place];  Service: Orthopedics;  Laterality: Left;   AMPUTATION Left 12/18/2018   Procedure: LEFT BELOW KNEE AMPUTATION;  Surgeon: Travis Minion, MD;  Location: [place];  Service:  Orthopedics;  Laterality: Left;   APPENDECTOMY  1943   BELOW KNEE LEG AMPUTATION Left 12/18/2018   CATARACT EXTRACTION  2012   x2   COLONOSCOPY N/A 11/01/2014   Procedure: COLONOSCOPY;  Surgeon: Travis Signs, MD;  [facility];  Service: Gastroenterology;  Laterality: N/A;   ESOPHAGOGASTRODUODENOSCOPY N/A 11/01/2014   Procedure: ESOPHAGOGASTRODUODENOSCOPY (EGD);  Surgeon: Travis Signs, MD;  [facility];  Service: Gastroenterology;  Laterality: N/A;   HERNIA REPAIR Left    INGUINAL HERNIA REPAIR Right 03/01/2013   Procedure: RIGHT INGUINAL HERNIORRHAPHY;  Surgeon: Travis So, MD;  Location[facility];  Service: General;  Laterality: Right;   INSERTION OF MESH Right 03/01/2013   Procedure: INSERTION OF MESH;  Surgeon: Travis So, MD;  Location: [facility];  Service: General;  Laterality: Right;   LOWER EXTREMITY ANGIOGRAPHY Right 08/25/2018   Procedure: LOWER EXTREMITY ANGIOGRAPHY;  Surgeon: Travis Hampshire, MD;  Location: [place] [facility];  Service: Cardiovascular;  Laterality: Right;   LOWER EXTREMITY ANGIOGRAPHY N/A 11/25/2018   Procedure: LOWER EXTREMITY ANGIOGRAPHY;  Surgeon: Travis Hampshire, MD;  Location: [place] [facility];  Service: Cardiovascular;  Laterality: N/A;   NM MYOCAR PERF WALL MOTION  06/05/2010   no significant ischemia   PERIPHERAL VASCULAR ATHERECTOMY Right 08/03/2018   Procedure: PERIPHERAL VASCULAR ATHERECTOMY;  Surgeon: Travis Harp, MD;  Location: [place] [facility];  Service:  Cardiovascular;  Laterality: Right;   PERIPHERAL VASCULAR ATHERECTOMY Left 11/23/2018   Procedure: PERIPHERAL VASCULAR ATHERECTOMY;  Surgeon: Travis Harp, MD;  Location: [place] [facility];  Service: Cardiovascular;  Laterality: Left;  sfa with dc balloon   PERIPHERAL VASCULAR BALLOON ANGIOPLASTY Left 11/25/2018   Procedure: PERIPHERAL VASCULAR BALLOON ANGIOPLASTY;  Surgeon: Travis Hampshire, MD;  Location: [place] [facility];  Service:  Cardiovascular;  Laterality: Left;  popliteal   PERIPHERAL VASCULAR INTERVENTION Left 11/25/2018   Procedure: PERIPHERAL VASCULAR INTERVENTION;  Surgeon: Travis Hampshire, MD;  Location: [place] [facility];  Service: Cardiovascular;  Laterality: Left;  tibial peroneal trunk and peroneal stents    stent  06/14/2010   left leg   STUMP REVISION Left 01/20/2019   Procedure: REVISION LEFT BELOW KNEE AMPUTATION;  Surgeon: Travis Minion, MD;  Location: [place];  Service: Orthopedics;  Laterality: Left;   US ECHOCARDIOGRAPHY  01/21/2006   moderate mitral annular ca+, mild MR, AOV moderately sclerotic.    Vitals:   09/06/19 1238  Weight: 223 lb 9.6 oz (101.4 kg)     Subjective Assessment - 09/06/19 1129    Subjective He saw cardiologist last week and CHF no diference. He was 209# in May and 216# last week.    Patient is accompained by: Family member   son, [name]   Pertinent History Bil TTAs, DM2, PAD, HTN, CHF, A-Fib, TIA, CKDst3    Patient Stated Goals walk with prostheses in home & in/out house. Son would like him to be able to toilet & get in/out bed by himself.    Currently in Pain? No/denies    Pain Onset In the past 7 days                            [facility] Adult PT Treatment/Exercise - 09/06/19 1135      Transfers   Transfers Sit to Stand;Stand to Sit;Stand Pivot Transfers;Squat Pivot Transfers    Sit to Stand 3: Mod assist;With upper extremity assist;With armrests;From chair/3-in-1;Other (comment)   to RW modA or to sink    Sit to Stand Details Manual facilitation for weight shifting;Verbal cues for safe use of DME/AE;Verbal cues for technique;Verbal cues for sequencing    Stand to Sit 4: Min assist;With upper extremity assist;With armrests;To chair/3-in-1;Other (comment)   from RW or sink   Stand to Sit Details (indicate cue type and reason) Verbal cues for technique;Verbal cues for sequencing;Manual facilitation for weight shifting;Verbal cues for safe use of  DME/AE      Ambulation/Gait   Ambulation/Gait Yes    Ambulation/Gait Assistance 3: Mod assist   Son assisting with PT for training.    Ambulation/Gait Assistance Details manual / tactile & verbal cues on upright posture and weight shift to stance LE.      Ambulation Distance (Feet) 30 Feet   25' + 90* turn to sit and 30' straight path   Assistive device Prostheses;Rolling walker    Gait Pattern Step-to pattern;Decreased step length - right;Decreased step length - left;Decreased hip/knee flexion - right;Decreased hip/knee flexion - left;Right foot flat;Left foot flat;Right flexed knee in stance;Left flexed knee in stance;Shuffle;Lateral hip instability;Trunk flexed;Poor foot clearance - left;Poor foot clearance - right    Ambulation Surface Level;Indoor      Knee/Hip Exercises: Stretches   Passive Hamstring Stretch Right;Left;1 rep;30 seconds   each LE   Passive Hamstring Stretch Limitations seated with LE on 8" foot stool  with knee extended and manual pressure at knee for extension.       Knee/Hip Exercises: Seated   Other Seated Knee/Hip Exercises seated SLR 10 reps 1 set ea LE. PT cues on technique      Prosthetics   Current prosthetic wear tolerance (days/week)  daily    Current prosthetic wear tolerance (#hours/day)  most of awake hours - up to 6hrs without shrinker under liners.     Edema non-pitting    Residual limb condition  no open areas, normal color & temperature,                   PT Education - 09/06/19 1226    Education Details seated SLR 10 reps 1 set each LE    Person(s) Educated Patient;Child(ren)    Methods Explanation;Demonstration;Verbal cues    Comprehension Verbalized understanding;Returned demonstration            PT Short Term Goals - 08/26/19 1318      PT SHORT TERM GOAL #1   Title Patient and family verbalize & demonstrate understanding of updated HEP.  (All updated STGs Target Date: 08/26/2019)    Baseline MET 08/26/2019    Time 4    Period  Weeks    Status Achieved    Target Date 08/26/19      PT SHORT TERM GOAL #2   Title Patient ambulates 30' and turns 90* to position to sit in chair with minA.    Baseline MET 08/26/2019    Time 4    Period Weeks    Status Achieved    Target Date 08/26/19      PT SHORT TERM GOAL #3   Title Stand -pivot transfer with RW between w/c & chair with armrests with modA    Baseline MET 08/26/2019    Time 4    Period Weeks    Status Achieved    Target Date 08/26/19      PT SHORT TERM GOAL #4   Title Standing balance with walker support reaches to counter anterior to RW with minA    Baseline MET 08/26/2019    Time 4    Period Weeks    Status Achieved    Target Date 08/26/19             PT Long Term Goals - 08/03/19 1646      PT LONG TERM GOAL #1   Title Patient and family verbalize and demonstrate understanding of proper prosthetic care to enable safe utilization of prostheses. (All LTGs Target Date: 09/24/2019)    Time 3    Period Months    Status On-going    Target Date 09/24/19      PT LONG TERM GOAL #2   Title Patient tolerates wear of bilateral prostheses >90% of awake hours without skin issues or limb pain to enable functional potential during his day.    Time 3    Period Months    Status On-going    Target Date 09/24/19      PT LONG TERM GOAL #3   Title Squat pivot transfer bed to w/c or chair to w/c with prostheses with supervision.    Time 3    Period Months    Status On-going    Target Date 09/24/19      PT LONG TERM GOAL #4   Title Sit to / from stand elevated w/c to walker with prostheses with minA    Time 3    Period Months  Status On-going    Target Date 09/24/19      PT LONG TERM GOAL #5   Title Standing balance with walker support with bilateral prostheses static stance for 2 minutes, scanning environment and reaches 2" anteriorly with supervision.    Time 3    Period Months    Status On-going    Target Date 09/24/19      PT LONG TERM GOAL #6     Title Patient ambulates 25' between 2 chairs including turning 90* to position to sit with RW & prostheses with minA from family safely.    Time 3    Period Months    Status On-going    Target Date 09/24/19                 Plan - 09/06/19 1129    Clinical Impression Statement Patient flucuates with strength from session which makes ambulating with only one family member not a safe option at this time.  He can ambulate with two family members safely.  PT added seated SLR to HEP and he appears to understand.    Personal Factors and Comorbidities Age;Comorbidity 3+;Fitness;Time since onset of injury/illness/exacerbation    Comorbidities Bil TTAs, DM2, PAD, HTN, CHF, A-Fib, TIA, CKDst3,    Examination-Activity Limitations Locomotion Level;Sit;Stand;Toileting;Transfers    Examination-Participation Restrictions Community Activity    Stability/Clinical Decision Making Evolving/Moderate complexity    Rehab Potential Good    PT Frequency 2x / week    PT Duration 12 weeks   3 months   PT Treatment/Interventions ADLs/Self Care Home Management;DME Instruction;Gait training;Stair training;Functional mobility training;Therapeutic activities;Therapeutic exercise;Balance training;Neuromuscular re-education;Patient/family education;Prosthetic Training;Manual techniques;Passive range of motion;Vestibular    PT Next Visit Plan monitor HR with activity,   work towards LTGs,  Therapuetic exercise    Consulted and Agree with Plan of Care Patient;Family member/caregiver    Family Member Consulted dtr, [name]           Patient will benefit from skilled therapeutic intervention in order to improve the following deficits and impairments:  Abnormal gait, Cardiopulmonary status limiting activity, Decreased activity tolerance, Decreased balance, Decreased endurance, Decreased knowledge of use of DME, Decreased mobility, Decreased range of motion, Decreased scar mobility, Decreased strength, Increased  edema, Impaired flexibility, Impaired UE functional use, Postural dysfunction, Prosthetic Dependency, Pain  Visit Diagnosis: Other abnormalities of gait and mobility  Unsteadiness on feet  Abnormal posture  History of fall  Stiffness of right knee, not elsewhere classified  Stiffness of left knee, not elsewhere classified  Contracture of muscle, multiple sites  Localized edema     Problem List Patient Active Problem List   Diagnosis Date Noted   Weakness generalized 04/05/2019   S/P BKA (below knee amputation) unilateral, left ([place]) 02/15/2019   Pressure injury of skin 02/03/2019   TIA (transient ischemic attack) 02/03/2019   Transient speech disturbance 02/02/2019   Chronic indwelling Foley catheter 01/22/2019   Postoperative wound infection 01/20/2019   Normocytic anemia 01/18/2019   Cellulitis 01/18/2019   Infection of deep incisional surgical site after procedure    Abnormal urinalysis    Labile blood glucose    Urinary retention    S/P bilateral BKA (below knee amputation) ([place])    Acute blood loss anemia    Urinary retention due to benign prostatic hyperplasia 12/28/2018   Unilateral complete BKA, left, initial encounter ([place]) 12/25/2018   Dehiscence of amputation stump ([place])    History of transmetatarsal amputation of left foot ([place]) 12/09/2018   Critical  limb ischemia with history of revascularization of same extremity    Gangrene of left foot ([facility])    Diabetic ulcer of toe of left foot associated with type 2 diabetes mellitus ([place])    Cellulitis of left lower extremity    Toe infection 11/18/2018   Peripheral edema    Acute on chronic diastolic CHF (congestive heart failure) ([place]) 11/09/2018   S/P BKA (below knee amputation) unilateral, right ([place]) 10/05/2018   Hyponatremia    Essential hypertension    Postoperative pain    Pain of left heel    Unable to maintain weight-bearing    Type 2 diabetes mellitus with diabetic  peripheral angiopathy with gangrene ([place])    Anemia due to acute blood loss    Chronic kidney disease, stage 3a    Acquired absence of right leg below knee ([place]) 09/07/2018   Gangrene of right foot ([place])    DNR (do not resuscitate) discussion    Goals of care, counseling/discussion    Palliative care by specialist    Type 2 diabetes mellitus with vascular disease ([place])    Severe protein-calorie malnutrition ([place])    Ischemic foot 08/20/2018   Critical lower limb ischemia 08/03/2018   Type 2 diabetes mellitus with foot ulcer, without long-term current use of insulin ([facility])    Gastroesophageal reflux disease    Cellulitis of great toe of right foot 05/29/2018   Atrial fibrillation, chronic    Chest pain 07/25/2017   PAF (paroxysmal atrial fibrillation) ([place]) 07/25/2017   BPH (benign prostatic hyperplasia) 07/25/2017   Shortness of breath 05/11/2017   Hypothyroidism 05/11/2017   Chronic diastolic CHF (congestive heart failure) ([place]) 05/11/2017   Moderate aortic stenosis 06/12/2016   RBBB 06/12/2016   Peripheral vascular disease ([place]) 08/04/2012   Essential hypertension, benign 08/04/2012   Hyperlipidemia 08/04/2012   Dizziness 08/04/2012    Jamey Reas  PT, DPT 09/06/2019, 12:39 PM  [facility] [address] [place], [place], [address] Phone: [phone]   Fax:  [phone]  Name: [name] MRN: [id number] Date of Birth: 08-07-1927

## 2019-09-07 ENCOUNTER — Encounter: Payer: Medicare Other | Admitting: Physical Therapy

## 2019-09-08 ENCOUNTER — Ambulatory Visit (INDEPENDENT_AMBULATORY_CARE_PROVIDER_SITE_OTHER): Payer: Medicare Other | Admitting: Physical Therapy

## 2019-09-08 ENCOUNTER — Other Ambulatory Visit: Payer: Self-pay

## 2019-09-08 DIAGNOSIS — M25661 Stiffness of right knee, not elsewhere classified: Secondary | ICD-10-CM

## 2019-09-08 DIAGNOSIS — M25662 Stiffness of left knee, not elsewhere classified: Secondary | ICD-10-CM

## 2019-09-08 DIAGNOSIS — R2681 Unsteadiness on feet: Secondary | ICD-10-CM | POA: Diagnosis not present

## 2019-09-08 DIAGNOSIS — R2689 Other abnormalities of gait and mobility: Secondary | ICD-10-CM | POA: Diagnosis not present

## 2019-09-08 DIAGNOSIS — R600 Localized edema: Secondary | ICD-10-CM

## 2019-09-08 DIAGNOSIS — M6249 Contracture of muscle, multiple sites: Secondary | ICD-10-CM

## 2019-09-08 DIAGNOSIS — R293 Abnormal posture: Secondary | ICD-10-CM | POA: Diagnosis not present

## 2019-09-08 NOTE — Therapy (Signed)
[facility] [address] [place], [place], [address] Phone: [phone]   Fax:  [phone]  Physical Therapy Treatment  Patient Details  Name: [name] MRN: [id number] Date of Birth: 04/24/1927 Referring Provider (PT): Marcus Duda, MD   Encounter Date: 09/08/2019   PT End of Session - 09/08/19 1315    Visit Number 46    Number of Visits 50    Date for PT Re-Evaluation 09/26/19    Authorization Type [employer] Medicare    Authorization Time Period $30 co-pay    Progress Note Due on Visit 50    PT Start Time 1150    PT Stop Time 1233    PT Time Calculation (min) 43 min    Equipment Utilized During Treatment Gait belt    Activity Tolerance Patient tolerated treatment well    Behavior During Therapy WFL for tasks assessed/performed           Past Medical History:  Diagnosis Date  . Arthritis   . Borderline diabetic   . Diabetes mellitus without complication (HCC)   . Glaucoma   . Hyperlipidemia   . Hypertension   . PVD (peripheral vascular disease) (HCC)   . Sleep apnea    Cpap ordered but doesnt use  . Urinary retention due to benign prostatic hyperplasia 12/28/2018    Past Surgical History:  Procedure Laterality Date  . ABDOMINAL AORTOGRAM W/LOWER EXTREMITY N/A 07/27/2018   Procedure: ABDOMINAL AORTOGRAM W/LOWER EXTREMITY;  Surgeon: Berry, Jonathan J, MD;  [facility];  Service: Cardiovascular;  Laterality: N/A;  . AMPUTATION Right 08/28/2018   Procedure: RIGHT BELOW KNEE AMPUTATION;  Surgeon: Duda, Marcus V, MD;  Location: [facility];  Service: Orthopedics;  Laterality: Right;  . AMPUTATION Left 12/02/2018   Procedure: LEFT TRANSMETATARSAL AMPUTATION AND NEGATIVE PRESSURE WOUND VAC PLACEMENT;  Surgeon: Duda, Marcus V, MD;  Location: [facility];  Service: Orthopedics;  Laterality: Left;  . AMPUTATION Left 12/18/2018   Procedure: LEFT BELOW KNEE AMPUTATION;  Surgeon: Duda, Marcus V, MD;  Location: [facility];  Service:  Orthopedics;  Laterality: Left;  . APPENDECTOMY  1943  . BELOW KNEE LEG AMPUTATION Left 12/18/2018  . CATARACT EXTRACTION  2012   x2  . COLONOSCOPY N/A 11/01/2014   Procedure: COLONOSCOPY;  Surgeon: Mark Jenkins, MD;  [facility]: [facility];  Service: Gastroenterology;  Laterality: N/A;  . ESOPHAGOGASTRODUODENOSCOPY N/A 11/01/2014   Procedure: ESOPHAGOGASTRODUODENOSCOPY (EGD);  Surgeon: Mark Jenkins, MD;  [facility]: [facility];  Service: Gastroenterology;  Laterality: N/A;  . HERNIA REPAIR Left   . INGUINAL HERNIA REPAIR Right 03/01/2013   Procedure: RIGHT INGUINAL HERNIORRHAPHY;  Surgeon: Mark A Jenkins, MD;  Location: [facility];  Service: General;  Laterality: Right;  . INSERTION OF MESH Right 03/01/2013   Procedure: INSERTION OF MESH;  Surgeon: Mark A Jenkins, MD;  Location: [facility];  Service: General;  Laterality: Right;  . LOWER EXTREMITY ANGIOGRAPHY Right 08/25/2018   Procedure: LOWER EXTREMITY ANGIOGRAPHY;  Surgeon: Arida, Muhammad A, MD;  [facility];  Service: Cardiovascular;  Laterality: Right;  . LOWER EXTREMITY ANGIOGRAPHY N/A 11/25/2018   Procedure: LOWER EXTREMITY ANGIOGRAPHY;  Surgeon: Arida, Muhammad A, MD;  [facility];  Service: Cardiovascular;  Laterality: N/A;  . NM MYOCAR PERF WALL MOTION  06/05/2010   no significant ischemia  . PERIPHERAL VASCULAR ATHERECTOMY Right 08/03/2018   Procedure: PERIPHERAL VASCULAR ATHERECTOMY;  Surgeon: Berry, Jonathan J, MD;  [facility];  Service:   Cardiovascular;  Laterality: Right;  . PERIPHERAL VASCULAR ATHERECTOMY Left 11/23/2018   Procedure: PERIPHERAL VASCULAR ATHERECTOMY;  Surgeon: Lorretta Harp, MD;  Location: [place] [facility];  Service: Cardiovascular;  Laterality: Left;  sfa with dc balloon  . PERIPHERAL VASCULAR BALLOON ANGIOPLASTY Left 11/25/2018   Procedure: PERIPHERAL VASCULAR BALLOON ANGIOPLASTY;  Surgeon: Wellington Hampshire, MD;  Location: [place] [facility];  Service:  Cardiovascular;  Laterality: Left;  popliteal  . PERIPHERAL VASCULAR INTERVENTION Left 11/25/2018   Procedure: PERIPHERAL VASCULAR INTERVENTION;  Surgeon: Wellington Hampshire, MD;  Location: [place] [facility];  Service: Cardiovascular;  Laterality: Left;  tibial peroneal trunk and peroneal stents   . stent  06/14/2010   left leg  . STUMP REVISION Left 01/20/2019   Procedure: REVISION LEFT BELOW KNEE AMPUTATION;  Surgeon: [name], MD;  Location: [facility];  Service: Orthopedics;  Laterality: Left;  . US ECHOCARDIOGRAPHY  01/21/2006   moderate mitral annular ca+, mild MR, AOV moderately sclerotic.    There were no vitals filed for this visit.                      [place] Adult PT Treatment/Exercise - 09/08/19 1150      Transfers   Transfers Sit to Stand;Stand to [employer] Pivot Transfers    Sit to Stand 3: Mod assist;With upper extremity assist;With armrests;From chair/3-in-1;Other (comment)   to RW modA or to sink    Sit to Stand Details Manual facilitation for weight shifting;Verbal cues for safe use of DME/AE;Verbal cues for technique;Verbal cues for sequencing    Stand to Sit 4: Min assist;With upper extremity assist;With armrests;To chair/3-in-1;Other (comment)   from RW or sink   Stand to Sit Details (indicate cue type and reason) Verbal cues for technique;Verbal cues for sequencing;Manual facilitation for weight shifting;Verbal cues for safe use of DME/AE      Ambulation/Gait   Ambulation/Gait Yes    Ambulation/Gait Assistance 3: Mod assist   Son assisting with PT for training.    Ambulation/Gait Assistance Details PT demo & verbal cues to patient & his son on safe assistance including lowering patient to floor with belt if needed.   Tactile & verbal cues on weight shift to stance limb and upright posture.     Ambulation Distance (Feet) 35 Feet   25' + 90* turn to sit and 35' straight path   Assistive device Prostheses;Rolling walker    Gait  Pattern Step-to pattern;Decreased step length - right;Decreased step length - left;Decreased hip/knee flexion - right;Decreased hip/knee flexion - left;Right foot flat;Left foot flat;Right flexed knee in stance;Left flexed knee in stance;Shuffle;Lateral hip instability;Trunk flexed;Poor foot clearance - left;Poor foot clearance - right    Ambulation Surface Level;Indoor      Therapeutic Activites    Other Therapeutic Activities standing at sink for 3 minutes with upright trunk, right /left and anterior / lateral weight shifts.  Standing prior to gait to stretch.      Knee/Hip Exercises: Stretches   Passive Hamstring Stretch Right;Left;1 rep;30 seconds   each LE   Passive Hamstring Stretch Limitations seated with LE on 8" foot stool with knee extended and manual pressure at knee for extension.       Knee/Hip Exercises: Seated   Other Seated Knee/Hip Exercises --      Prosthetics   Current prosthetic wear tolerance (days/week)  daily    Current prosthetic wear tolerance (#hours/day)  most of awake hours - up to  6hrs without shrinker under liners.     Edema non-pitting    Residual limb condition  no open areas, normal color & temperature,                     PT Short Term Goals - 08/26/19 1318      PT SHORT TERM GOAL #1   Title Patient and family verbalize & demonstrate understanding of updated HEP.  (All updated STGs Target Date: 08/26/2019)    Baseline MET 08/26/2019    Time 4    Period Weeks    Status Achieved    Target Date 08/26/19      PT SHORT TERM GOAL #2   Title Patient ambulates 30' and turns 90* to position to sit in chair with minA.    Baseline MET 08/26/2019    Time 4    Period Weeks    Status Achieved    Target Date 08/26/19      PT SHORT TERM GOAL #3   Title Stand -pivot transfer with RW between w/c & chair with armrests with modA    Baseline MET 08/26/2019    Time 4    Period Weeks    Status Achieved    Target Date 08/26/19      PT SHORT TERM GOAL #4     Title Standing balance with walker support reaches to counter anterior to RW with minA    Baseline MET 08/26/2019    Time 4    Period Weeks    Status Achieved    Target Date 08/26/19             PT Long Term Goals - 08/03/19 1646      PT LONG TERM GOAL #1   Title Patient and family verbalize and demonstrate understanding of proper prosthetic care to enable safe utilization of prostheses. (All LTGs Target Date: 09/24/2019)    Time 3    Period Months    Status On-going    Target Date 09/24/19      PT LONG TERM GOAL #2   Title Patient tolerates wear of bilateral prostheses >90% of awake hours without skin issues or limb pain to enable functional potential during his day.    Time 3    Period Months    Status On-going    Target Date 09/24/19      PT LONG TERM GOAL #3   Title Squat pivot transfer bed to w/c or chair to w/c with prostheses with supervision.    Time 3    Period Months    Status On-going    Target Date 09/24/19      PT LONG TERM GOAL #4   Title Sit to / from stand elevated w/c to walker with prostheses with minA    Time 3    Period Months    Status On-going    Target Date 09/24/19      PT LONG TERM GOAL #5   Title Standing balance with walker support with bilateral prostheses static stance for 2 minutes, scanning environment and reaches 2" anteriorly with supervision.    Time 3    Period Months    Status On-going    Target Date 09/24/19      PT LONG TERM GOAL #6   Title Patient ambulates 25' between 2 chairs including turning 90* to position to sit with RW & prostheses with minA from family safely.    Time 3    Period Months  Status On-going    Target Date 09/24/19                 Plan - 09/08/19 1316    Clinical Impression Statement Patient and family are making progress towards overall goal of functioning in home environment with assistance of one family member. However PT, family & patient do not anticipate getting to optimal level by  end of current plan of care with recertification anticipated.    Personal Factors and Comorbidities Age;Comorbidity 3+;Fitness;Time since onset of injury/illness/exacerbation    Comorbidities Bil TTAs, DM2, PAD, HTN, CHF, A-Fib, TIA, CKDst3,    Examination-Activity Limitations Locomotion Level;Sit;Stand;Toileting;Transfers    Examination-Participation Restrictions Community Activity    Stability/Clinical Decision Making Evolving/Moderate complexity    Rehab Potential Good    PT Frequency 2x / week    PT Duration 12 weeks   3 months   PT Treatment/Interventions ADLs/Self Care Home Management;DME Instruction;Gait training;Stair training;Functional mobility training;Therapeutic activities;Therapeutic exercise;Balance training;Neuromuscular re-education;Patient/family education;Prosthetic Training;Manual techniques;Passive range of motion;Vestibular    PT Next Visit Plan monitor HR with activity,   work towards LTGs,  Therapuetic exercise    Consulted and Agree with Plan of Care Patient;Family member/caregiver    Family Member Consulted dtr, [name] [name]           Patient will benefit from skilled therapeutic intervention in order to improve the following deficits and impairments:  Abnormal gait, Cardiopulmonary status limiting activity, Decreased activity tolerance, Decreased balance, Decreased endurance, Decreased knowledge of use of DME, Decreased mobility, Decreased range of motion, Decreased scar mobility, Decreased strength, Increased edema, Impaired flexibility, Impaired UE functional use, Postural dysfunction, Prosthetic Dependency, Pain  Visit Diagnosis: Other abnormalities of gait and mobility  Unsteadiness on feet  Abnormal posture  Stiffness of right knee, not elsewhere classified  Stiffness of left knee, not elsewhere classified  Contracture of muscle, multiple sites  Localized edema     Problem List Patient Active Problem List   Diagnosis Date Noted  . Weakness  generalized 04/05/2019  . S/P BKA (below knee amputation) unilateral, left (HCC) 02/15/2019  . Pressure injury of skin 02/03/2019  . TIA (transient ischemic attack) 02/03/2019  . Transient speech disturbance 02/02/2019  . Chronic indwelling Foley catheter 01/22/2019  . Postoperative wound infection 01/20/2019  . Normocytic anemia 01/18/2019  . Cellulitis 01/18/2019  . Infection of deep incisional surgical site after procedure   . Abnormal urinalysis   . Labile blood glucose   . Urinary retention   . S/P bilateral BKA (below knee amputation) (HCC)   . Acute blood loss anemia   . Urinary retention due to benign prostatic hyperplasia 12/28/2018  . Unilateral complete BKA, left, initial encounter (HCC) 12/25/2018  . Dehiscence of amputation stump (HCC)   . History of transmetatarsal amputation of left foot (HCC) 12/09/2018  . Critical limb ischemia with history of revascularization of same extremity   . Gangrene of left foot (HCC)   . Diabetic ulcer of toe of left foot associated with type 2 diabetes mellitus (HCC)   . Cellulitis of left lower extremity   . Toe infection 11/18/2018  . Peripheral edema   . Acute on chronic diastolic CHF (congestive heart failure) (HCC) 11/09/2018  . S/P BKA (below knee amputation) unilateral, right (HCC) 10/05/2018  . Hyponatremia   . Essential hypertension   . Postoperative pain   . Pain of left heel   . Unable to maintain weight-bearing   . Type 2 diabetes mellitus with diabetic peripheral angiopathy with gangrene (  [place])   . Anemia due to acute blood loss   . Chronic kidney disease, stage 3a   . Acquired absence of right leg below knee ([place]) 09/07/2018  . Gangrene of right foot ([place])   . DNR (do not resuscitate) discussion   . Goals of care, counseling/discussion   . Palliative care by specialist   . Type 2 diabetes mellitus with vascular disease ([place])   . Severe protein-calorie malnutrition ([place])   . Ischemic foot 08/20/2018  . Critical lower  limb ischemia 08/03/2018  . Type 2 diabetes mellitus with foot ulcer, without long-term current use of insulin ([place])   . Gastroesophageal reflux disease   . Cellulitis of great toe of right foot 05/29/2018  . Atrial fibrillation, chronic   . Chest pain 07/25/2017  . PAF (paroxysmal atrial fibrillation) ([place]) 07/25/2017  . BPH (benign prostatic hyperplasia) 07/25/2017  . Shortness of breath 05/11/2017  . Hypothyroidism 05/11/2017  . Chronic diastolic CHF (congestive heart failure) ([place]) 05/11/2017  . Moderate aortic stenosis 06/12/2016  . RBBB 06/12/2016  . Peripheral vascular disease ([place]) 08/04/2012  . Essential hypertension, benign 08/04/2012  . Hyperlipidemia 08/04/2012  . Dizziness 08/04/2012    Jamey Reas PT, DPT 09/08/2019, 1:33 PM  [facility] [address] [place], [place], [address] Phone: [phone]   Fax:  [phone]  Name: SWAYZE PRIES MRN: [id number] Date of Birth: October 18, 1927

## 2019-09-09 ENCOUNTER — Encounter: Payer: Medicare Other | Admitting: Physical Therapy

## 2019-09-14 ENCOUNTER — Ambulatory Visit (INDEPENDENT_AMBULATORY_CARE_PROVIDER_SITE_OTHER): Payer: Medicare Other | Admitting: Physical Therapy

## 2019-09-14 ENCOUNTER — Other Ambulatory Visit: Payer: Self-pay

## 2019-09-14 ENCOUNTER — Encounter: Payer: Self-pay | Admitting: Physical Therapy

## 2019-09-14 DIAGNOSIS — R2681 Unsteadiness on feet: Secondary | ICD-10-CM | POA: Diagnosis not present

## 2019-09-14 DIAGNOSIS — M25662 Stiffness of left knee, not elsewhere classified: Secondary | ICD-10-CM | POA: Diagnosis not present

## 2019-09-14 DIAGNOSIS — M25661 Stiffness of right knee, not elsewhere classified: Secondary | ICD-10-CM | POA: Diagnosis not present

## 2019-09-14 DIAGNOSIS — R293 Abnormal posture: Secondary | ICD-10-CM

## 2019-09-14 DIAGNOSIS — R2689 Other abnormalities of gait and mobility: Secondary | ICD-10-CM

## 2019-09-14 DIAGNOSIS — R600 Localized edema: Secondary | ICD-10-CM

## 2019-09-14 DIAGNOSIS — M6249 Contracture of muscle, multiple sites: Secondary | ICD-10-CM

## 2019-09-14 NOTE — Therapy (Signed)
[facility] [address] [place], [place], [address] Phone: [phone]   Fax:  [phone]  Physical Therapy Treatment  Patient Details  Name: [name] MRN: [id number] Date of Birth: 1928/01/06 Referring Provider (PT): [name], MD   Encounter Date: 09/14/2019   PT End of Session - 09/14/19 1057    Visit Number 61    Number of Visits 50    Date for PT Re-Evaluation 09/26/19    Authorization Type [employer] Medicare    Authorization Time Period $30 co-pay    Progress Note Due on Visit 46    PT Start Time 1058    PT Stop Time 1145    PT Time Calculation (min) 47 min    Equipment Utilized During Treatment Gait belt    Activity Tolerance Patient tolerated treatment well    Behavior During Therapy WFL for tasks assessed/performed           Past Medical History:  Diagnosis Date  . Arthritis   . Borderline diabetic   . Diabetes mellitus without complication ([place])   . Glaucoma   . Hyperlipidemia   . Hypertension   . PVD (peripheral vascular disease) ([place])   . Sleep apnea    Cpap ordered but doesnt use  . Urinary retention due to benign prostatic hyperplasia 12/28/2018    Past Surgical History:  Procedure Laterality Date  . ABDOMINAL AORTOGRAM W/LOWER EXTREMITY N/A 07/27/2018   Procedure: ABDOMINAL AORTOGRAM W/LOWER EXTREMITY;  Surgeon: Lorretta Harp, MD;  Location: [place] [facility];  Service: Cardiovascular;  Laterality: N/A;  . AMPUTATION Right 08/28/2018   Procedure: RIGHT BELOW KNEE AMPUTATION;  Surgeon: [name], MD;  Location: [place];  Service: Orthopedics;  Laterality: Right;  . AMPUTATION Left 12/02/2018   Procedure: LEFT TRANSMETATARSAL AMPUTATION AND NEGATIVE PRESSURE WOUND VAC PLACEMENT;  Surgeon: [name], MD;  Location: [place];  Service: Orthopedics;  Laterality: Left;  . AMPUTATION Left 12/18/2018   Procedure: LEFT BELOW KNEE AMPUTATION;  Surgeon: [name], MD;  Location: [place];  Service:  Orthopedics;  Laterality: Left;  . APPENDECTOMY  1943  . BELOW KNEE LEG AMPUTATION Left 12/18/2018  . CATARACT EXTRACTION  2012   x2  . COLONOSCOPY N/A 11/01/2014   Procedure: COLONOSCOPY;  Surgeon: Aviva Signs, MD;  [facility];  Service: Gastroenterology;  Laterality: N/A;  . ESOPHAGOGASTRODUODENOSCOPY N/A 11/01/2014   Procedure: ESOPHAGOGASTRODUODENOSCOPY (EGD);  Surgeon: Aviva Signs, MD;  [facility];  Service: Gastroenterology;  Laterality: N/A;  . HERNIA REPAIR Left   . INGUINAL HERNIA REPAIR Right 03/01/2013   Procedure: RIGHT INGUINAL HERNIORRHAPHY;  Surgeon: Jamesetta So, MD;  Location: [facility];  Service: General;  Laterality: Right;  . INSERTION OF MESH Right 03/01/2013   Procedure: INSERTION OF MESH;  Surgeon: Jamesetta So, MD;  [facility]: [facility];  Service: General;  Laterality: Right;  . LOWER EXTREMITY ANGIOGRAPHY Right 08/25/2018   Procedure: LOWER EXTREMITY ANGIOGRAPHY;  Surgeon: Wellington Hampshire, MD;  Location: [place] [facility];  Service: Cardiovascular;  Laterality: Right;  . LOWER EXTREMITY ANGIOGRAPHY N/A 11/25/2018   Procedure: LOWER EXTREMITY ANGIOGRAPHY;  Surgeon: Wellington Hampshire, MD;  Location: [place] [facility];  Service: Cardiovascular;  Laterality: N/A;  . NM MYOCAR PERF WALL MOTION  06/05/2010   no significant ischemia  . PERIPHERAL VASCULAR ATHERECTOMY Right 08/03/2018   Procedure: PERIPHERAL VASCULAR ATHERECTOMY;  Surgeon: Lorretta Harp, MD;  Location: [place] [facility];  Service:  Cardiovascular;  Laterality: Right;  . PERIPHERAL VASCULAR ATHERECTOMY Left 11/23/2018   Procedure: PERIPHERAL VASCULAR ATHERECTOMY;  Surgeon: Lorretta Harp, MD;  Location: [place] [facility];  Service: Cardiovascular;  Laterality: Left;  sfa with dc balloon  . PERIPHERAL VASCULAR BALLOON ANGIOPLASTY Left 11/25/2018   Procedure: PERIPHERAL VASCULAR BALLOON ANGIOPLASTY;  Surgeon: Wellington Hampshire, MD;  Location: [place] [facility];  Service:  Cardiovascular;  Laterality: Left;  popliteal  . PERIPHERAL VASCULAR INTERVENTION Left 11/25/2018   Procedure: PERIPHERAL VASCULAR INTERVENTION;  Surgeon: Wellington Hampshire, MD;  Location: [place] [facility];  Service: Cardiovascular;  Laterality: Left;  tibial peroneal trunk and peroneal stents   . stent  06/14/2010   left leg  . STUMP REVISION Left 01/20/2019   Procedure: REVISION LEFT BELOW KNEE AMPUTATION;  Surgeon: [name], MD;  Location: [place];  Service: Orthopedics;  Laterality: Left;  . US ECHOCARDIOGRAPHY  01/21/2006   moderate mitral annular ca+, mild MR, AOV moderately sclerotic.    There were no vitals filed for this visit.   Subjective Assessment - 09/14/19 1059    Subjective Urologist said he could try to wean off foley catheter. His son & pt report urinating every 15-20 minutes with limited ability to control at this time.  They are using condom catheter at night.    Patient is accompained by: Family member   son, [name]   Pertinent History Bil TTAs, DM2, PAD, HTN, CHF, A-Fib, TIA, CKDst3    Patient Stated Goals walk with prostheses in home & in/out house. Son would like him to be able to toilet & get in/out bed by himself.    Currently in Pain? No/denies    Pain Onset In the past 7 days                             [facility] Adult PT Treatment/Exercise - 09/14/19 1100      Transfers   Transfers Sit to Stand;Stand to [employer] Pivot Transfers    Sit to Stand 3: Mod assist;With upper extremity assist;With armrests;From chair/3-in-1;Other (comment);4: Min guard   to [place] to sink    Sit to Stand Details Manual facilitation for weight shifting;Verbal cues for safe use of DME/AE;Verbal cues for technique;Verbal cues for sequencing    Sit to Stand Details (indicate cue type and reason) son, [name], able to assist sit to stand to RW by himself including stabilizing RW. PT cueing technique but no assistance required.     Stand  to Sit 4: Min assist;With upper extremity assist;With armrests;To chair/3-in-1;Other (comment)   from RW or sink   Stand to Sit Details (indicate cue type and reason) Verbal cues for technique;Verbal cues for sequencing;Manual facilitation for weight shifting;Verbal cues for safe use of DME/AE    Stand to Sit Details demo & verbal cues on looking under his shoulder not over shoulder to look for armrests.       Ambulation/Gait   Ambulation/Gait Yes    Ambulation/Gait Assistance 3: Mod assist   Son assisting with PT for training.    Ambulation/Gait Assistance Details demo & verbal cues on movement / distance of RW to facilitate upright posture.      Ambulation Distance (Feet) 35 Feet   25' + 90* turn to sit and 35' straight path   Assistive device Prostheses;Rolling walker    Gait Pattern Step-to pattern;Decreased step length - right;Decreased step length -  left;Decreased hip/knee flexion - right;Decreased hip/knee flexion - left;Right foot flat;Left foot flat;Right flexed knee in stance;Left flexed knee in stance;Shuffle;Lateral hip instability;Trunk flexed;Poor foot clearance - left;Poor foot clearance - right    Ambulation Surface Level;Indoor      Therapeutic Activites    Other Therapeutic Activities standing at sink for 3 minutes with upright trunk, right /left and anterior / lateral weight shifts.  Standing prior to gait to stretch.      Prosthetics   Current prosthetic wear tolerance (days/week)  daily    Current prosthetic wear tolerance (#hours/day)  most of awake hours - up to 6hrs without shrinker under liners.     Edema non-pitting    Residual limb condition  no open areas, normal color & temperature,                   PT Education - 09/14/19 1130    Education Details working on voiding upon need even when using condom catheter.    Person(s) Educated Patient    Methods Explanation;Verbal cues    Comprehension Verbalized understanding            PT Short Term Goals  - 08/26/19 1318      PT SHORT TERM GOAL #1   Title Patient and family verbalize & demonstrate understanding of updated HEP.  (All updated STGs Target Date: 08/26/2019)    Baseline MET 08/26/2019    Time 4    Period Weeks    Status Achieved    Target Date 08/26/19      PT SHORT TERM GOAL #2   Title Patient ambulates 30' and turns 90* to position to sit in chair with minA.    Baseline MET 08/26/2019    Time 4    Period Weeks    Status Achieved    Target Date 08/26/19      PT SHORT TERM GOAL #3   Title Stand -pivot transfer with RW between w/c & chair with armrests with modA    Baseline MET 08/26/2019    Time 4    Period Weeks    Status Achieved    Target Date 08/26/19      PT SHORT TERM GOAL #4   Title Standing balance with walker support reaches to counter anterior to RW with minA    Baseline MET 08/26/2019    Time 4    Period Weeks    Status Achieved    Target Date 08/26/19             PT Long Term Goals - 08/03/19 1646      PT LONG TERM GOAL #1   Title Patient and family verbalize and demonstrate understanding of proper prosthetic care to enable safe utilization of prostheses. (All LTGs Target Date: 09/24/2019)    Time 3    Period Months    Status On-going    Target Date 09/24/19      PT LONG TERM GOAL #2   Title Patient tolerates wear of bilateral prostheses >90% of awake hours without skin issues or limb pain to enable functional potential during his day.    Time 3    Period Months    Status On-going    Target Date 09/24/19      PT LONG TERM GOAL #3   Title Squat pivot transfer bed to w/c or chair to w/c with prostheses with supervision.    Time 3    Period Months    Status On-going  Target Date 09/24/19      PT LONG TERM GOAL #4   Title Sit to / from stand elevated w/c to walker with prostheses with minA    Time 3    Period Months    Status On-going    Target Date 09/24/19      PT LONG TERM GOAL #5   Title Standing balance with walker support with  bilateral prostheses static stance for 2 minutes, scanning environment and reaches 2" anteriorly with supervision.    Time 3    Period Months    Status On-going    Target Date 09/24/19      PT LONG TERM GOAL #6   Title Patient ambulates 25' between 2 chairs including turning 90* to position to sit with RW & prostheses with minA from family safely.    Time 3    Period Months    Status On-going    Target Date 09/24/19                 Plan - 09/14/19 1059    Clinical Impression Statement Son improved ability to assist his father with gait but still does not appear safe for only one family member to assist. He has potential to function in home with only one family member with additional skilled PT. So PT plans to recertify for additional plan of care at next visit.    Personal Factors and Comorbidities Age;Comorbidity 3+;Fitness;Time since onset of injury/illness/exacerbation    Comorbidities Bil TTAs, DM2, PAD, HTN, CHF, A-Fib, TIA, CKDst3,    Examination-Activity Limitations Locomotion Level;Sit;Stand;Toileting;Transfers    Examination-Participation Restrictions Community Activity    Stability/Clinical Decision Making Evolving/Moderate complexity    Rehab Potential Good    PT Frequency 2x / week    PT Duration 12 weeks   3 months   PT Treatment/Interventions ADLs/Self Care Home Management;DME Instruction;Gait training;Stair training;Functional mobility training;Therapeutic activities;Therapeutic exercise;Balance training;Neuromuscular re-education;Patient/family education;Prosthetic Training;Manual techniques;Passive range of motion;Vestibular    PT Next Visit Plan monitor HR with activity,   assess LTGs & recertify for [id number],  Therapuetic exercise    Consulted and Agree with Plan of Care Patient;Family member/caregiver    Family Member Consulted son, Jaydrian Corpening           Patient will benefit from skilled therapeutic intervention in order to improve the following deficits and  impairments:  Abnormal gait, Cardiopulmonary status limiting activity, Decreased activity tolerance, Decreased balance, Decreased endurance, Decreased knowledge of use of DME, Decreased mobility, Decreased range of motion, Decreased scar mobility, Decreased strength, Increased edema, Impaired flexibility, Impaired UE functional use, Postural dysfunction, Prosthetic Dependency, Pain  Visit Diagnosis: Other abnormalities of gait and mobility  Unsteadiness on feet  Abnormal posture  Stiffness of right knee, not elsewhere classified  Stiffness of left knee, not elsewhere classified  Contracture of muscle, multiple sites  Localized edema     Problem List Patient Active Problem List   Diagnosis Date Noted  . Weakness generalized 04/05/2019  . S/P BKA (below knee amputation) unilateral, left ([place]) 02/15/2019  . Pressure injury of skin 02/03/2019  . TIA (transient ischemic attack) 02/03/2019  . Transient speech disturbance 02/02/2019  . Chronic indwelling Foley catheter 01/22/2019  . Postoperative wound infection 01/20/2019  . Normocytic anemia 01/18/2019  . Cellulitis 01/18/2019  . Infection of deep incisional surgical site after procedure   . Abnormal urinalysis   . Labile blood glucose   . Urinary retention   . S/P bilateral BKA (below knee amputation) ([place])   .  Acute blood loss anemia   . Urinary retention due to benign prostatic hyperplasia 12/28/2018  . Unilateral complete BKA, left, initial encounter ([place]) 12/25/2018  . Dehiscence of amputation stump ([place])   . History of transmetatarsal amputation of left foot ([place]) 12/09/2018  . Critical limb ischemia with history of revascularization of same extremity   . Gangrene of left foot ([place])   . Diabetic ulcer of toe of left foot associated with type 2 diabetes mellitus ([place])   . Cellulitis of left lower extremity   . Toe infection 11/18/2018  . Peripheral edema   . Acute on chronic diastolic CHF (congestive heart failure)  ([place]) 11/09/2018  . S/P BKA (below knee amputation) unilateral, right ([place]) 10/05/2018  . Hyponatremia   . Essential hypertension   . Postoperative pain   . Pain of left heel   . Unable to maintain weight-bearing   . Type 2 diabetes mellitus with diabetic peripheral angiopathy with gangrene ([place])   . Anemia due to acute blood loss   . Chronic kidney disease, stage 3a   . Acquired absence of right leg below knee ([place]) 09/07/2018  . Gangrene of right foot ([place])   . DNR (do not resuscitate) discussion   . Goals of care, counseling/discussion   . Palliative care by specialist   . Type 2 diabetes mellitus with vascular disease ([place])   . Severe protein-calorie malnutrition ([place])   . Ischemic foot 08/20/2018  . Critical lower limb ischemia 08/03/2018  . Type 2 diabetes mellitus with foot ulcer, without long-term current use of insulin ([place])   . Gastroesophageal reflux disease   . Cellulitis of great toe of right foot 05/29/2018  . Atrial fibrillation, chronic   . Chest pain 07/25/2017  . PAF (paroxysmal atrial fibrillation) ([place]) 07/25/2017  . BPH (benign prostatic hyperplasia) 07/25/2017  . Shortness of breath 05/11/2017  . Hypothyroidism 05/11/2017  . Chronic diastolic CHF (congestive heart failure) ([place]) 05/11/2017  . Moderate aortic stenosis 06/12/2016  . RBBB 06/12/2016  . Peripheral vascular disease ([place]) 08/04/2012  . Essential hypertension, benign 08/04/2012  . Hyperlipidemia 08/04/2012  . Dizziness 08/04/2012    Jamey Reas PT, DPT 09/14/2019, 1:59 PM  [facility] [address] [place], [place], [address] Phone: [phone]   Fax:  [phone]  Name: THADDUS MCDOWELL MRN: [id number] Date of Birth: October 08, 1927

## 2019-09-16 ENCOUNTER — Other Ambulatory Visit: Payer: Self-pay

## 2019-09-16 ENCOUNTER — Encounter: Payer: Self-pay | Admitting: Physical Therapy

## 2019-09-16 ENCOUNTER — Ambulatory Visit: Payer: Medicare Other | Admitting: Physical Therapy

## 2019-09-16 DIAGNOSIS — M25661 Stiffness of right knee, not elsewhere classified: Secondary | ICD-10-CM

## 2019-09-16 DIAGNOSIS — M6281 Muscle weakness (generalized): Secondary | ICD-10-CM

## 2019-09-16 DIAGNOSIS — R2681 Unsteadiness on feet: Secondary | ICD-10-CM | POA: Diagnosis not present

## 2019-09-16 DIAGNOSIS — M25662 Stiffness of left knee, not elsewhere classified: Secondary | ICD-10-CM

## 2019-09-16 DIAGNOSIS — M6249 Contracture of muscle, multiple sites: Secondary | ICD-10-CM

## 2019-09-16 DIAGNOSIS — R2689 Other abnormalities of gait and mobility: Secondary | ICD-10-CM

## 2019-09-16 DIAGNOSIS — R293 Abnormal posture: Secondary | ICD-10-CM | POA: Diagnosis not present

## 2019-09-16 DIAGNOSIS — R600 Localized edema: Secondary | ICD-10-CM

## 2019-09-16 NOTE — Therapy (Signed)
[facility] [address] [place], [place], [address] Phone: [phone]   Fax:  [phone]  Physical Therapy Treatment  Patient Details  Name: [name] MRN: [id number] Date of Birth: 03/09/1927 Referring Provider (PT): [name], MD   Encounter Date: 09/16/2019   PT End of Session - 09/16/19 1100    Visit Number 51    Number of Visits 50    Date for PT Re-Evaluation 09/26/19    Authorization Type [employer] Medicare    Authorization Time Period $30 co-pay    Progress Note Due on Visit 37    PT Start Time 1100    PT Stop Time 1145    PT Time Calculation (min) 45 min    Equipment Utilized During Treatment Gait belt    Activity Tolerance Patient tolerated treatment well    Behavior During Therapy WFL for tasks assessed/performed           Past Medical History:  Diagnosis Date  . Arthritis   . Borderline diabetic   . Diabetes mellitus without complication ([place])   . Glaucoma   . Hyperlipidemia   . Hypertension   . PVD (peripheral vascular disease) ([place])   . Sleep apnea    Cpap ordered but doesnt use  . Urinary retention due to benign prostatic hyperplasia 12/28/2018    Past Surgical History:  Procedure Laterality Date  . ABDOMINAL AORTOGRAM W/LOWER EXTREMITY N/A 07/27/2018   Procedure: ABDOMINAL AORTOGRAM W/LOWER EXTREMITY;  Surgeon: Lorretta Harp, MD;  Location: [place] [facility];  Service: Cardiovascular;  Laterality: N/A;  . AMPUTATION Right 08/28/2018   Procedure: RIGHT BELOW KNEE AMPUTATION;  Surgeon: [name], MD;  Location: [place];  Service: Orthopedics;  Laterality: Right;  . AMPUTATION Left 12/02/2018   Procedure: LEFT TRANSMETATARSAL AMPUTATION AND NEGATIVE PRESSURE WOUND VAC PLACEMENT;  Surgeon: [name], MD;  Location: [place];  Service: Orthopedics;  Laterality: Left;  . AMPUTATION Left 12/18/2018   Procedure: LEFT BELOW KNEE AMPUTATION;  Surgeon: [name], MD;  Location: [place];  Service: Orthopedics;   Laterality: Left;  . APPENDECTOMY  1943  . BELOW KNEE LEG AMPUTATION Left 12/18/2018  . CATARACT EXTRACTION  2012   x2  . COLONOSCOPY N/A 11/01/2014   Procedure: COLONOSCOPY;  Surgeon: Aviva Signs, MD;  [facility];  Service: Gastroenterology;  Laterality: N/A;  . ESOPHAGOGASTRODUODENOSCOPY N/A 11/01/2014   Procedure: ESOPHAGOGASTRODUODENOSCOPY (EGD);  Surgeon: Aviva Signs, MD;  [facility];  Service: Gastroenterology;  Laterality: N/A;  . HERNIA REPAIR Left   . INGUINAL HERNIA REPAIR Right 03/01/2013   Procedure: RIGHT INGUINAL HERNIORRHAPHY;  Surgeon: Jamesetta So, MD;  Location: [facility];  Service: General;  Laterality: Right;  . INSERTION OF MESH Right 03/01/2013   Procedure: INSERTION OF MESH;  Surgeon: Jamesetta So, MD;  [facility];  Service: General;  Laterality: Right;  . LOWER EXTREMITY ANGIOGRAPHY Right 08/25/2018   Procedure: LOWER EXTREMITY ANGIOGRAPHY;  Surgeon: Wellington Hampshire, MD;  Location: [place] [facility];  Service: Cardiovascular;  Laterality: Right;  . LOWER EXTREMITY ANGIOGRAPHY N/A 11/25/2018   Procedure: LOWER EXTREMITY ANGIOGRAPHY;  Surgeon: Wellington Hampshire, MD;  Location: [place] [facility];  Service: Cardiovascular;  Laterality: N/A;  . NM MYOCAR PERF WALL MOTION  06/05/2010   no significant ischemia  . PERIPHERAL VASCULAR ATHERECTOMY Right 08/03/2018   Procedure: PERIPHERAL VASCULAR ATHERECTOMY;  Surgeon: Lorretta Harp, MD;  Location: [place] [facility];  Service:  Cardiovascular;  Laterality: Right;  . PERIPHERAL VASCULAR ATHERECTOMY Left 11/23/2018   Procedure: PERIPHERAL VASCULAR ATHERECTOMY;  Surgeon: Lorretta Harp, MD;  Location: [place] [facility];  Service: Cardiovascular;  Laterality: Left;  sfa with dc balloon  . PERIPHERAL VASCULAR BALLOON ANGIOPLASTY Left 11/25/2018   Procedure: PERIPHERAL VASCULAR BALLOON ANGIOPLASTY;  Surgeon: Wellington Hampshire, MD;  Location: [place] [facility];  Service: Cardiovascular;   Laterality: Left;  popliteal  . PERIPHERAL VASCULAR INTERVENTION Left 11/25/2018   Procedure: PERIPHERAL VASCULAR INTERVENTION;  Surgeon: Wellington Hampshire, MD;  Location: [place] [facility];  Service: Cardiovascular;  Laterality: Left;  tibial peroneal trunk and peroneal stents   . stent  06/14/2010   left leg  . STUMP REVISION Left 01/20/2019   Procedure: REVISION LEFT BELOW KNEE AMPUTATION;  Surgeon: [name], MD;  Location: [place];  Service: Orthopedics;  Laterality: Left;  . US ECHOCARDIOGRAPHY  01/21/2006   moderate mitral annular ca+, mild MR, AOV moderately sclerotic.    There were no vitals filed for this visit.   Subjective Assessment - 09/16/19 1100    Subjective He is having difficulty with incontinence and condom cathether comes off.  His son & he report improvements in his mobility with PT but feel that they need additional PT to maximize his function.    Patient is accompained by: Family member   son, [name]   Pertinent History Bil TTAs, DM2, PAD, HTN, CHF, A-Fib, TIA, CKDst3    Patient Stated Goals walk with prostheses in home & in/out house. Son would like him to be able to toilet & get in/out bed by himself.    Currently in Pain? No/denies    Pain Onset In the past 7 days                             [facility] Adult PT Treatment/Exercise - 09/16/19 1100      Transfers   Transfers Sit to Stand;Stand to [employer] Pivot Transfers    Sit to Stand 3: Mod assist;With upper extremity assist;With armrests;From chair/3-in-1;Other (comment);4: Min guard   to RW modA & minA pulling to sink    Sit to Stand Details Manual facilitation for weight shifting;Verbal cues for safe use of DME/AE;Verbal cues for technique;Verbal cues for sequencing    Stand to Sit 4: Min assist;With upper extremity assist;With armrests;To chair/3-in-1;Other (comment)   from RW or sink   Stand to Sit Details (indicate cue type and reason) Verbal cues for  technique;Verbal cues for sequencing;Manual facilitation for weight shifting;Verbal cues for safe use of DME/AE    Lateral/Scoot Transfers 4: Min assist;With armrests removed   level surfaces     Ambulation/Gait   Ambulation/Gait Yes    Ambulation/Gait Assistance 3: Mod assist    Ambulation/Gait Assistance Details manual / tactile cues on wt shift to stance limb, upright posture and RW control     Ambulation Distance (Feet) 40 Feet   straight path   Assistive device Prostheses;Rolling walker    Gait Pattern Step-to pattern;Decreased step length - right;Decreased step length - left;Decreased hip/knee flexion - right;Decreased hip/knee flexion - left;Right foot flat;Left foot flat;Right flexed knee in stance;Left flexed knee in stance;Shuffle;Lateral hip instability;Trunk flexed;Poor foot clearance - left;Poor foot clearance - right      Therapeutic Activites    Other Therapeutic Activities standing at sink for 3 minutes with upright trunk, right /left and anterior / lateral weight  shifts.  Standing prior to gait to stretch.      Prosthetics   Current prosthetic wear tolerance (days/week)  daily    Current prosthetic wear tolerance (#hours/day)  most of awake hours - up to 6hrs without shrinker under liners.     Edema non-pitting    Residual limb condition  no open areas, normal color & temperature,           Bridging 5 times with PT stabilizing prosthetic feet with minimal clearance.  Rolling left & right with simulated bed rail with minA.           PT Short Term Goals - 08/26/19 1318      PT SHORT TERM GOAL #1   Title Patient and family verbalize & demonstrate understanding of updated HEP.  (All updated STGs Target Date: 08/26/2019)    Baseline MET 08/26/2019    Time 4    Period Weeks    Status Achieved    Target Date 08/26/19      PT SHORT TERM GOAL #2   Title Patient ambulates 30' and turns 90* to position to sit in chair with minA.    Baseline MET 08/26/2019    Time 4      Period Weeks    Status Achieved    Target Date 08/26/19      PT SHORT TERM GOAL #3   Title Stand -pivot transfer with RW between w/c & chair with armrests with modA    Baseline MET 08/26/2019    Time 4    Period Weeks    Status Achieved    Target Date 08/26/19      PT SHORT TERM GOAL #4   Title Standing balance with walker support reaches to counter anterior to RW with minA    Baseline MET 08/26/2019    Time 4    Period Weeks    Status Achieved    Target Date 08/26/19             PT Long Term Goals - 08/03/19 1646      PT LONG TERM GOAL #1   Title Patient and family verbalize and demonstrate understanding of proper prosthetic care to enable safe utilization of prostheses. (All LTGs Target Date: 09/24/2019)    Time 3    Period Months    Status On-going    Target Date 09/24/19      PT LONG TERM GOAL #2   Title Patient tolerates wear of bilateral prostheses >90% of awake hours without skin issues or limb pain to enable functional potential during his day.    Time 3    Period Months    Status On-going    Target Date 09/24/19      PT LONG TERM GOAL #3   Title Squat pivot transfer bed to w/c or chair to w/c with prostheses with supervision.    Time 3    Period Months    Status On-going    Target Date 09/24/19      PT LONG TERM GOAL #4   Title Sit to / from stand elevated w/c to walker with prostheses with minA    Time 3    Period Months    Status On-going    Target Date 09/24/19      PT LONG TERM GOAL #5   Title Standing balance with walker support with bilateral prostheses static stance for 2 minutes, scanning environment and reaches 2" anteriorly with supervision.    Time 3  Period Months    Status On-going    Target Date 09/24/19      PT LONG TERM GOAL #6   Title Patient ambulates 25' between 2 chairs including turning 90* to position to sit with RW & prostheses with minA from family safely.    Time 3    Period Months    Status On-going    Target Date  09/24/19                 Plan - 09/16/19 1100    Clinical Impression Statement PT session interupted by urinary incontinence and need to clean / change his pants / diaper.  Patient will probably need recertification to maximize his functional potential.    Personal Factors and Comorbidities Age;Comorbidity 3+;Fitness;Time since onset of injury/illness/exacerbation    Comorbidities Bil TTAs, DM2, PAD, HTN, CHF, A-Fib, TIA, CKDst3,    Examination-Activity Limitations Locomotion Level;Sit;Stand;Toileting;Transfers    Examination-Participation Restrictions Community Activity    Stability/Clinical Decision Making Evolving/Moderate complexity    Rehab Potential Good    PT Frequency 2x / week    PT Duration 12 weeks   3 months   PT Treatment/Interventions ADLs/Self Care Home Management;DME Instruction;Gait training;Stair training;Functional mobility training;Therapeutic activities;Therapeutic exercise;Balance training;Neuromuscular re-education;Patient/family education;Prosthetic Training;Manual techniques;Passive range of motion;Vestibular    PT Next Visit Plan monitor HR with activity,   assess LTGs & recertify for [id number],  Prosthetic gait, Therapuetic exercise    Consulted and Agree with Plan of Care Patient;Family member/caregiver    Family Member Consulted son, Dewel Lotter           Patient will benefit from skilled therapeutic intervention in order to improve the following deficits and impairments:  Abnormal gait, Cardiopulmonary status limiting activity, Decreased activity tolerance, Decreased balance, Decreased endurance, Decreased knowledge of use of DME, Decreased mobility, Decreased range of motion, Decreased scar mobility, Decreased strength, Increased edema, Impaired flexibility, Impaired UE functional use, Postural dysfunction, Prosthetic Dependency, Pain  Visit Diagnosis: Other abnormalities of gait and mobility  Unsteadiness on feet  Abnormal posture  Stiffness of  right knee, not elsewhere classified  Stiffness of left knee, not elsewhere classified  Contracture of muscle, multiple sites  Localized edema  Muscle weakness (generalized)     Problem List Patient Active Problem List   Diagnosis Date Noted  . Weakness generalized 04/05/2019  . S/P BKA (below knee amputation) unilateral, left ([place]) 02/15/2019  . Pressure injury of skin 02/03/2019  . TIA (transient ischemic attack) 02/03/2019  . Transient speech disturbance 02/02/2019  . Chronic indwelling Foley catheter 01/22/2019  . Postoperative wound infection 01/20/2019  . Normocytic anemia 01/18/2019  . Cellulitis 01/18/2019  . Infection of deep incisional surgical site after procedure   . Abnormal urinalysis   . Labile blood glucose   . Urinary retention   . S/P bilateral BKA (below knee amputation) ([place])   . Acute blood loss anemia   . Urinary retention due to benign prostatic hyperplasia 12/28/2018  . Unilateral complete BKA, left, initial encounter ([place]) 12/25/2018  . Dehiscence of amputation stump ([place])   . History of transmetatarsal amputation of left foot ([place]) 12/09/2018  . Critical limb ischemia with history of revascularization of same extremity   . Gangrene of left foot ([place])   . Diabetic ulcer of toe of left foot associated with type 2 diabetes mellitus ([place])   . Cellulitis of left lower extremity   . Toe infection 11/18/2018  . Peripheral edema   . Acute on chronic diastolic CHF (congestive  heart failure) ([place]) 11/09/2018  . S/P BKA (below knee amputation) unilateral, right ([place]) 10/05/2018  . Hyponatremia   . Essential hypertension   . Postoperative pain   . Pain of left heel   . Unable to maintain weight-bearing   . Type 2 diabetes mellitus with diabetic peripheral angiopathy with gangrene ([place])   . Anemia due to acute blood loss   . Chronic kidney disease, stage 3a   . Acquired absence of right leg below knee ([place]) 09/07/2018  . Gangrene of right foot ([place])     . DNR (do not resuscitate) discussion   . Goals of care, counseling/discussion   . Palliative care by specialist   . Type 2 diabetes mellitus with vascular disease ([place])   . Severe protein-calorie malnutrition ([place])   . Ischemic foot 08/20/2018  . Critical lower limb ischemia 08/03/2018  . Type 2 diabetes mellitus with foot ulcer, without long-term current use of insulin ([place])   . Gastroesophageal reflux disease   . Cellulitis of great toe of right foot 05/29/2018  . Atrial fibrillation, chronic   . Chest pain 07/25/2017  . PAF (paroxysmal atrial fibrillation) ([place]) 07/25/2017  . BPH (benign prostatic hyperplasia) 07/25/2017  . Shortness of breath 05/11/2017  . Hypothyroidism 05/11/2017  . Chronic diastolic CHF (congestive heart failure) ([place]) 05/11/2017  . Moderate aortic stenosis 06/12/2016  . RBBB 06/12/2016  . Peripheral vascular disease ([place]) 08/04/2012  . Essential hypertension, benign 08/04/2012  . Hyperlipidemia 08/04/2012  . Dizziness 08/04/2012    Jamey Reas PT, DPT 09/16/2019, 1:21 PM  [facility] [address] [place], [place], [address] Phone: [phone]   Fax:  [phone]  Name: BERLEY GAMBRELL MRN: [id number] Date of Birth: March 08, 1927

## 2019-09-21 ENCOUNTER — Encounter: Payer: Self-pay | Admitting: Physical Therapy

## 2019-09-21 ENCOUNTER — Ambulatory Visit: Payer: Medicare Other | Admitting: Physical Therapy

## 2019-09-21 ENCOUNTER — Other Ambulatory Visit: Payer: Self-pay

## 2019-09-21 VITALS — Wt 221.4 lb

## 2019-09-21 DIAGNOSIS — R600 Localized edema: Secondary | ICD-10-CM

## 2019-09-21 DIAGNOSIS — R293 Abnormal posture: Secondary | ICD-10-CM | POA: Diagnosis not present

## 2019-09-21 DIAGNOSIS — R2689 Other abnormalities of gait and mobility: Secondary | ICD-10-CM

## 2019-09-21 DIAGNOSIS — M25661 Stiffness of right knee, not elsewhere classified: Secondary | ICD-10-CM | POA: Diagnosis not present

## 2019-09-21 DIAGNOSIS — R2681 Unsteadiness on feet: Secondary | ICD-10-CM | POA: Diagnosis not present

## 2019-09-21 DIAGNOSIS — M6281 Muscle weakness (generalized): Secondary | ICD-10-CM

## 2019-09-21 DIAGNOSIS — M25662 Stiffness of left knee, not elsewhere classified: Secondary | ICD-10-CM

## 2019-09-21 DIAGNOSIS — M6249 Contracture of muscle, multiple sites: Secondary | ICD-10-CM

## 2019-09-21 NOTE — Therapy (Signed)
[facility] [address] [place], [place], [address] Phone: [phone]   Fax:  [phone]  Physical Therapy Treatment  Patient Details  Name: [name] MRN: [id number] Date of Birth: 29-Dec-1927 Referring Provider (PT): [name], MD   Encounter Date: 09/21/2019   PT End of Session - 09/21/19 1111    Visit Number 22    Number of Visits 50    Date for PT Re-Evaluation 09/26/19    Authorization Type [employer] Medicare    Authorization Time Period $30 co-pay    Progress Note Due on Visit 47    PT Start Time 1100    PT Stop Time 1140    PT Time Calculation (min) 40 min    Equipment Utilized During Treatment Gait belt    Activity Tolerance Patient tolerated treatment well    Behavior During Therapy [facility] for tasks assessed/performed           Past Medical History:  Diagnosis Date   Arthritis    Borderline diabetic    Diabetes mellitus without complication ([place])    Glaucoma    Hyperlipidemia    Hypertension    PVD (peripheral vascular disease) ([place])    Sleep apnea    Cpap ordered but doesnt use   Urinary retention due to benign prostatic hyperplasia 12/28/2018    Past Surgical History:  Procedure Laterality Date   ABDOMINAL AORTOGRAM W/LOWER EXTREMITY N/A 07/27/2018   Procedure: ABDOMINAL AORTOGRAM W/LOWER EXTREMITY;  Surgeon: Lorretta Harp, MD;  Location: [place] [facility];  Service: Cardiovascular;  Laterality: N/A;   AMPUTATION Right 08/28/2018   Procedure: RIGHT BELOW KNEE AMPUTATION;  Surgeon: [name], MD;  Location: [place];  Service: Orthopedics;  Laterality: Right;   AMPUTATION Left 12/02/2018   Procedure: LEFT TRANSMETATARSAL AMPUTATION AND NEGATIVE PRESSURE WOUND VAC PLACEMENT;  Surgeon: [name], MD;  Location: [place];  Service: Orthopedics;  Laterality: Left;   AMPUTATION Left 12/18/2018   Procedure: LEFT BELOW KNEE AMPUTATION;  Surgeon: [name], MD;  Location: [place];  Service: Orthopedics;   Laterality: Left;   APPENDECTOMY  1943   BELOW KNEE LEG AMPUTATION Left 12/18/2018   CATARACT EXTRACTION  2012   x2   COLONOSCOPY N/A 11/01/2014   Procedure: COLONOSCOPY;  Surgeon: Aviva Signs, MD;  [facility];  Service: Gastroenterology;  Laterality: N/A;   ESOPHAGOGASTRODUODENOSCOPY N/A 11/01/2014   Procedure: ESOPHAGOGASTRODUODENOSCOPY (EGD);  Surgeon: Aviva Signs, MD;  [facility];  Service: Gastroenterology;  Laterality: N/A;   HERNIA REPAIR Left    INGUINAL HERNIA REPAIR Right 03/01/2013   Procedure: RIGHT INGUINAL HERNIORRHAPHY;  Surgeon: Jamesetta So, MD;  Location: [facility];  Service: General;  Laterality: Right;   INSERTION OF MESH Right 03/01/2013   Procedure: INSERTION OF MESH;  Surgeon: Jamesetta So, MD;  Location: [facility];  Service: General;  Laterality: Right;   LOWER EXTREMITY ANGIOGRAPHY Right 08/25/2018   Procedure: LOWER EXTREMITY ANGIOGRAPHY;  Surgeon: Wellington Hampshire, MD;  Location: [place] [facility];  Service: Cardiovascular;  Laterality: Right;   LOWER EXTREMITY ANGIOGRAPHY N/A 11/25/2018   Procedure: LOWER EXTREMITY ANGIOGRAPHY;  Surgeon: Wellington Hampshire, MD;  Location: [place] [facility];  Service: Cardiovascular;  Laterality: N/A;   NM MYOCAR PERF WALL MOTION  06/05/2010   no significant ischemia   PERIPHERAL VASCULAR ATHERECTOMY Right 08/03/2018   Procedure: PERIPHERAL VASCULAR ATHERECTOMY;  Surgeon: Lorretta Harp, MD;  Location: [place] [facility];  Service:  Cardiovascular;  Laterality: Right;   PERIPHERAL VASCULAR ATHERECTOMY Left 11/23/2018   Procedure: PERIPHERAL VASCULAR ATHERECTOMY;  Surgeon: Lorretta Harp, MD;  Location: [place] [facility];  Service: Cardiovascular;  Laterality: Left;  sfa with dc balloon   PERIPHERAL VASCULAR BALLOON ANGIOPLASTY Left 11/25/2018   Procedure: PERIPHERAL VASCULAR BALLOON ANGIOPLASTY;  Surgeon: Wellington Hampshire, MD;  Location: [place] [facility];  Service: Cardiovascular;   Laterality: Left;  popliteal   PERIPHERAL VASCULAR INTERVENTION Left 11/25/2018   Procedure: PERIPHERAL VASCULAR INTERVENTION;  Surgeon: Wellington Hampshire, MD;  Location: [place] [facility];  Service: Cardiovascular;  Laterality: Left;  tibial peroneal trunk and peroneal stents    stent  06/14/2010   left leg   STUMP REVISION Left 01/20/2019   Procedure: REVISION LEFT BELOW KNEE AMPUTATION;  Surgeon: [name], MD;  Location: [place];  Service: Orthopedics;  Laterality: Left;   US ECHOCARDIOGRAPHY  01/21/2006   moderate mitral annular ca+, mild MR, AOV moderately sclerotic.    Vitals:   09/21/19 1253  Weight: 221 lb 6.4 oz (100.4 kg)     Subjective Assessment - 09/21/19 1100    Subjective His son, [name], did not do much activities this weekend.    Patient is accompained by: Family member   son, Thermon Zulauf   Pertinent History Bil TTAs, DM2, PAD, HTN, CHF, A-Fib, TIA, CKDst3    Patient Stated Goals walk with prostheses in home & in/out house. Son would like him to be able to toilet & get in/out bed by himself.    Currently in Pain? No/denies    Pain Onset In the past 7 days                             [facility] Adult PT Treatment/Exercise - 09/21/19 1100      Transfers   Transfers Sit to Stand;Stand to [employer] Pivot Transfers    Sit to Stand 3: Mod assist;With upper extremity assist;With armrests;From chair/3-in-1;Other (comment);4: Min guard   to RW modA & minA pulling to sink    Sit to Stand Details Manual facilitation for weight shifting;Verbal cues for safe use of DME/AE;Verbal cues for technique;Verbal cues for sequencing    Stand to Sit 4: Min assist;With upper extremity assist;With armrests;To chair/3-in-1;Other (comment)   from RW or sink   Stand to Sit Details (indicate cue type and reason) Verbal cues for technique;Verbal cues for sequencing;Manual facilitation for weight shifting;Verbal cues for safe use of DME/AE    Lateral/Scoot  Transfers --      Ambulation/Gait   Ambulation/Gait Yes    Ambulation/Gait Assistance 3: Mod assist    Ambulation/Gait Assistance Details PT blocking stance knee, manual facilitation at pelvis for weight shift over pelvis, verbal cues on sequence and upright posture.     Ambulation Distance (Feet) 40 Feet   25' + 90* turn and 40' straight path   Assistive device Prostheses;Rolling walker    Gait Pattern Step-to pattern;Decreased step length - right;Decreased step length - left;Decreased hip/knee flexion - right;Decreased hip/knee flexion - left;Right foot flat;Left foot flat;Right flexed knee in stance;Left flexed knee in stance;Shuffle;Lateral hip instability;Trunk flexed;Poor foot clearance - left;Poor foot clearance - right    Ambulation Surface Level;Indoor      Therapeutic Activites    Other Therapeutic Activities standing at sink for 3 minutes with upright trunk, right /left and anterior / lateral weight shifts.  Standing prior to gait to  stretch.      Prosthetics   Current prosthetic wear tolerance (days/week)  daily    Current prosthetic wear tolerance (#hours/day)  most of awake hours - up to 6hrs without shrinker under liners.     Edema non-pitting    Residual limb condition  no open areas, normal color & temperature,                     PT Short Term Goals - 08/26/19 1318      PT SHORT TERM GOAL #1   Title Patient and family verbalize & demonstrate understanding of updated HEP.  (All updated STGs Target Date: 08/26/2019)    Baseline MET 08/26/2019    Time 4    Period Weeks    Status Achieved    Target Date 08/26/19      PT SHORT TERM GOAL #2   Title Patient ambulates 30' and turns 90* to position to sit in chair with minA.    Baseline MET 08/26/2019    Time 4    Period Weeks    Status Achieved    Target Date 08/26/19      PT SHORT TERM GOAL #3   Title Stand -pivot transfer with RW between w/c & chair with armrests with modA    Baseline MET 08/26/2019     Time 4    Period Weeks    Status Achieved    Target Date 08/26/19      PT SHORT TERM GOAL #4   Title Standing balance with walker support reaches to counter anterior to RW with minA    Baseline MET 08/26/2019    Time 4    Period Weeks    Status Achieved    Target Date 08/26/19             PT Long Term Goals - 08/03/19 1646      PT LONG TERM GOAL #1   Title Patient and family verbalize and demonstrate understanding of proper prosthetic care to enable safe utilization of prostheses. (All LTGs Target Date: 09/24/2019)    Time 3    Period Months    Status On-going    Target Date 09/24/19      PT LONG TERM GOAL #2   Title Patient tolerates wear of bilateral prostheses >90% of awake hours without skin issues or limb pain to enable functional potential during his day.    Time 3    Period Months    Status On-going    Target Date 09/24/19      PT LONG TERM GOAL #3   Title Squat pivot transfer bed to w/c or chair to w/c with prostheses with supervision.    Time 3    Period Months    Status On-going    Target Date 09/24/19      PT LONG TERM GOAL #4   Title Sit to / from stand elevated w/c to walker with prostheses with minA    Time 3    Period Months    Status On-going    Target Date 09/24/19      PT LONG TERM GOAL #5   Title Standing balance with walker support with bilateral prostheses static stance for 2 minutes, scanning environment and reaches 2" anteriorly with supervision.    Time 3    Period Months    Status On-going    Target Date 09/24/19      PT LONG TERM GOAL #6   Title Patient ambulates 25'  between 2 chairs including turning 90* to position to sit with RW & prostheses with minA from family safely.    Time 3    Period Months    Status On-going    Target Date 09/24/19                 Plan - 09/21/19 1112    Clinical Impression Statement Patient is making progress but slowly due to age & chronic health issues. He is progressing with ability to  function with less burden of care on family but needs additional skilled PT to meet his potential    Personal Factors and Comorbidities Age;Comorbidity 3+;Fitness;Time since onset of injury/illness/exacerbation    Comorbidities Bil TTAs, DM2, PAD, HTN, CHF, A-Fib, TIA, CKDst3,    Examination-Activity Limitations Locomotion Level;Sit;Stand;Toileting;Transfers    Examination-Participation Restrictions Community Activity    Stability/Clinical Decision Making Evolving/Moderate complexity    Rehab Potential Good    PT Frequency 2x / week    PT Duration 12 weeks   3 months   PT Treatment/Interventions ADLs/Self Care Home Management;DME Instruction;Gait training;Stair training;Functional mobility training;Therapeutic activities;Therapeutic exercise;Balance training;Neuromuscular re-education;Patient/family education;Prosthetic Training;Manual techniques;Passive range of motion;Vestibular    PT Next Visit Plan monitor HR with activity,   assess LTGs & recertify for [id number],  Prosthetic gait, Therapuetic exercise    Consulted and Agree with Plan of Care Patient;Family member/caregiver    Family Member Consulted son, Kiet Geer           Patient will benefit from skilled therapeutic intervention in order to improve the following deficits and impairments:  Abnormal gait, Cardiopulmonary status limiting activity, Decreased activity tolerance, Decreased balance, Decreased endurance, Decreased knowledge of use of DME, Decreased mobility, Decreased range of motion, Decreased scar mobility, Decreased strength, Increased edema, Impaired flexibility, Impaired UE functional use, Postural dysfunction, Prosthetic Dependency, Pain  Visit Diagnosis: Other abnormalities of gait and mobility  Unsteadiness on feet  Abnormal posture  Stiffness of right knee, not elsewhere classified  Stiffness of left knee, not elsewhere classified  Contracture of muscle, multiple sites  Localized edema  Muscle weakness  (generalized)     Problem List Patient Active Problem List   Diagnosis Date Noted   Weakness generalized 04/05/2019   S/P BKA (below knee amputation) unilateral, left ([place]) 02/15/2019   Pressure injury of skin 02/03/2019   TIA (transient ischemic attack) 02/03/2019   Transient speech disturbance 02/02/2019   Chronic indwelling Foley catheter 01/22/2019   Postoperative wound infection 01/20/2019   Normocytic anemia 01/18/2019   Cellulitis 01/18/2019   Infection of deep incisional surgical site after procedure    Abnormal urinalysis    Labile blood glucose    Urinary retention    S/P bilateral BKA (below knee amputation) ([place])    Acute blood loss anemia    Urinary retention due to benign prostatic hyperplasia 12/28/2018   Unilateral complete BKA, left, initial encounter ([place]) 12/25/2018   Dehiscence of amputation stump ([place])    History of transmetatarsal amputation of left foot ([place]) 12/09/2018   Critical limb ischemia with history of revascularization of same extremity    Gangrene of left foot (H[facility])    Diabetic ulcer of toe of left foot associated with type 2 diabetes mellitus ([place])    Cellulitis of left lower extremity    Toe infection 11/18/2018   Peripheral edema    Acute on chronic diastolic CHF (congestive heart failure) ([place]) 11/09/2018   S/P BKA (below knee amputation) unilateral, right ([place]) 10/05/2018   Hyponatremia  Essential hypertension    Postoperative pain    Pain of left heel    Unable to maintain weight-bearing    Type 2 diabetes mellitus with diabetic peripheral angiopathy with gangrene ([place])    Anemia due to acute blood loss    Chronic kidney disease, stage 3a    Acquired absence of right leg below knee ([place]) 09/07/2018   Gangrene of right foot (HCC)    DNR (do not resuscitate) discussion    Goals of care, counseling/discussion    Palliative care by specialist    Type 2 diabetes mellitus with vascular disease  ([place])    Severe protein-calorie malnutrition ([place])    Ischemic foot 08/20/2018   Critical lower limb ischemia 08/03/2018   Type 2 diabetes mellitus with foot ulcer, without long-term current use of insulin (HCC)    Gastroesophageal reflux disease    Cellulitis of great toe of right foot 05/29/2018   Atrial fibrillation, chronic    Chest pain 07/25/2017   PAF (paroxysmal atrial fibrillation) ([place]) 07/25/2017   BPH (benign prostatic hyperplasia) 07/25/2017   Shortness of breath 05/11/2017   Hypothyroidism 05/11/2017   Chronic diastolic CHF (congestive heart failure) ([place]) 05/11/2017   Moderate aortic stenosis 06/12/2016   RBBB 06/12/2016   Peripheral vascular disease ([place]) 08/04/2012   Essential hypertension, benign 08/04/2012   Hyperlipidemia 08/04/2012   Dizziness 08/04/2012    Jamey Reas PT, DPT 09/21/2019, 12:56 PM  [facility] [address] [place], [place], [address] Phone: [phone]   Fax:  [phone]  Name: [name] MRN: [id number] Date of Birth: Jul 27, 1927

## 2019-09-22 ENCOUNTER — Ambulatory Visit: Payer: Medicare Other | Admitting: Physical Therapy

## 2019-09-22 ENCOUNTER — Encounter: Payer: Self-pay | Admitting: Physical Therapy

## 2019-09-22 DIAGNOSIS — R293 Abnormal posture: Secondary | ICD-10-CM | POA: Diagnosis not present

## 2019-09-22 DIAGNOSIS — M6249 Contracture of muscle, multiple sites: Secondary | ICD-10-CM

## 2019-09-22 DIAGNOSIS — M25662 Stiffness of left knee, not elsewhere classified: Secondary | ICD-10-CM | POA: Diagnosis not present

## 2019-09-22 DIAGNOSIS — M25661 Stiffness of right knee, not elsewhere classified: Secondary | ICD-10-CM

## 2019-09-22 DIAGNOSIS — R2689 Other abnormalities of gait and mobility: Secondary | ICD-10-CM

## 2019-09-22 DIAGNOSIS — R2681 Unsteadiness on feet: Secondary | ICD-10-CM

## 2019-09-22 DIAGNOSIS — R600 Localized edema: Secondary | ICD-10-CM

## 2019-09-22 DIAGNOSIS — M6281 Muscle weakness (generalized): Secondary | ICD-10-CM

## 2019-09-22 NOTE — Therapy (Signed)
[facility] [address] [place], [place], [address] Phone: [phone]   Fax:  [phone]  Physical Therapy Treatment, Recertification & 53GU Visit Progress Note  Patient Details  Name: [name] MRN: [id number] Date of Birth: 1927/06/29 Referring Provider (PT): [name], MD   Encounter Date: 09/22/2019   Progress Note Reporting Period 08/24/2019  to 09/22/2019  See note below for Objective Data and Assessment of Progress/Goals.        PT End of Session - 09/22/19 1610    Visit Number 50    Number of Visits 16    Date for PT Re-Evaluation 12/17/19    Authorization Type [employer] Medicare    Authorization Time Period $30 co-pay    Progress Note Due on Visit 57    PT Start Time 1145    PT Stop Time 1230    PT Time Calculation (min) 45 min    Equipment Utilized During Treatment Gait belt    Activity Tolerance Patient tolerated treatment well    Behavior During Therapy WFL for tasks assessed/performed           Past Medical History:  Diagnosis Date  . Arthritis   . Borderline diabetic   . Diabetes mellitus without complication ([place])   . Glaucoma   . Hyperlipidemia   . Hypertension   . PVD (peripheral vascular disease) ([place])   . Sleep apnea    Cpap ordered but doesnt use  . Urinary retention due to benign prostatic hyperplasia 12/28/2018    Past Surgical History:  Procedure Laterality Date  . ABDOMINAL AORTOGRAM W/LOWER EXTREMITY N/A 07/27/2018   Procedure: ABDOMINAL AORTOGRAM W/LOWER EXTREMITY;  Surgeon: Lorretta Harp, MD;  Location: [place] [facility];  Service: Cardiovascular;  Laterality: N/A;  . AMPUTATION Right 08/28/2018   Procedure: RIGHT BELOW KNEE AMPUTATION;  Surgeon: [name], MD;  Location: [place];  Service: Orthopedics;  Laterality: Right;  . AMPUTATION Left 12/02/2018   Procedure: LEFT TRANSMETATARSAL AMPUTATION AND NEGATIVE PRESSURE WOUND VAC PLACEMENT;  Surgeon: [name], MD;  Location: [place];   Service: Orthopedics;  Laterality: Left;  . AMPUTATION Left 12/18/2018   Procedure: LEFT BELOW KNEE AMPUTATION;  Surgeon: [name], MD;  Location: [place];  Service: Orthopedics;  Laterality: Left;  . APPENDECTOMY  1943  . BELOW KNEE LEG AMPUTATION Left 12/18/2018  . CATARACT EXTRACTION  2012   x2  . COLONOSCOPY N/A 11/01/2014   Procedure: COLONOSCOPY;  Surgeon: Aviva Signs, MD;  [facility];  Service: Gastroenterology;  Laterality: N/A;  . ESOPHAGOGASTRODUODENOSCOPY N/A 11/01/2014   Procedure: ESOPHAGOGASTRODUODENOSCOPY (EGD);  Surgeon: Aviva Signs, MD;  [facility];  Service: Gastroenterology;  Laterality: N/A;  . HERNIA REPAIR Left   . INGUINAL HERNIA REPAIR Right 03/01/2013   Procedure: RIGHT INGUINAL HERNIORRHAPHY;  Surgeon: Jamesetta So, MD;  Location: [facility];  Service: General;  Laterality: Right;  . INSERTION OF MESH Right 03/01/2013   Procedure: INSERTION OF MESH;  Surgeon: Jamesetta So, MD;  Location: [facility];  Service: General;  Laterality: Right;  . LOWER EXTREMITY ANGIOGRAPHY Right 08/25/2018   Procedure: LOWER EXTREMITY ANGIOGRAPHY;  Surgeon: Wellington Hampshire, MD;  Location: [place] [facility];  Service: Cardiovascular;  Laterality: Right;  . LOWER EXTREMITY ANGIOGRAPHY N/A 11/25/2018   Procedure: LOWER EXTREMITY ANGIOGRAPHY;  Surgeon: Wellington Hampshire, MD;  Location: [place] [facility];  Service: Cardiovascular;  Laterality: N/A;  . NM MYOCAR PERF WALL MOTION  06/05/2010  no significant ischemia  . PERIPHERAL VASCULAR ATHERECTOMY Right 08/03/2018   Procedure: PERIPHERAL VASCULAR ATHERECTOMY;  Surgeon: Lorretta Harp, MD;  Location: [place] [facility];  Service: Cardiovascular;  Laterality: Right;  . PERIPHERAL VASCULAR ATHERECTOMY Left 11/23/2018   Procedure: PERIPHERAL VASCULAR ATHERECTOMY;  Surgeon: Lorretta Harp, MD;  Location: [place] [facility];  Service: Cardiovascular;  Laterality: Left;  sfa with dc balloon  . PERIPHERAL VASCULAR  BALLOON ANGIOPLASTY Left 11/25/2018   Procedure: PERIPHERAL VASCULAR BALLOON ANGIOPLASTY;  Surgeon: Wellington Hampshire, MD;  Location: [place] [facility];  Service: Cardiovascular;  Laterality: Left;  popliteal  . PERIPHERAL VASCULAR INTERVENTION Left 11/25/2018   Procedure: PERIPHERAL VASCULAR INTERVENTION;  Surgeon: Wellington Hampshire, MD;  Location: [place] [facility];  Service: Cardiovascular;  Laterality: Left;  tibial peroneal trunk and peroneal stents   . stent  06/14/2010   left leg  . STUMP REVISION Left 01/20/2019   Procedure: REVISION LEFT BELOW KNEE AMPUTATION;  Surgeon: [name], MD;  Location: [place];  Service: Orthopedics;  Laterality: Left;  . US ECHOCARDIOGRAPHY  01/21/2006   moderate mitral annular ca+, mild MR, AOV moderately sclerotic.    There were no vitals filed for this visit.   Subjective Assessment - 09/22/19 1611    Subjective Patient & son report making progress with PT but feel that he needs more PT to improve further.    Patient is accompained by: Family member   son, [name]   Pertinent History Bil TTAs, DM2, PAD, HTN, CHF, A-Fib, TIA, CKDst3    Patient Stated Goals walk with prostheses in home & in/out house. Son would like him to be able to toilet & get in/out bed by himself.    Currently in Pain? No/denies    Pain Onset In the past 7 days              [facility] PT Assessment - 09/22/19 1145      Assessment   Medical Diagnosis Bilateral Transtibial Amputations    Referring Provider (PT) [name], MD      Posture/Postural Control   Posture/Postural Control Postural limitations    Postural Limitations Rounded Shoulders;Forward head;Flexed trunk      AROM   Overall AROM  Deficits    Overall AROM Comments standing with RW support: knee extension right -11* & left -15*      Static Standing Balance   Static Standing - Balance Support Bilateral upper extremity supported   RW support   Static Standing - Level of Assistance 4: Min assist     Static Standing - Comment/# of Minutes 2 minutes      Dynamic Standing Balance   Dynamic Standing - Balance Support Bilateral upper extremity supported;During functional activity;Left upper extremity supported   BUE scanning & LUE support to reach with RUE   Dynamic Standing - Level of Assistance 4: Min assist    Dynamic Standing - Comments scans right & left with BUE support on RW.  Reaching with RUE 2" in front of RW with LUE support.                          [facility] PT Treatment/Exercise - 09/22/19 1145      Transfers   Transfers Sit to Stand;Stand to [employer] Pivot Transfers    Sit to Stand 3: Mod assist;With upper extremity assist;With armrests;From chair/3-in-1;Other (comment);4: Min guard   to RW modA & min  guard pulling to sink    Sit to Stand Details Manual facilitation for weight shifting;Verbal cues for safe use of DME/AE;Verbal cues for technique;Verbal cues for sequencing    Stand to Sit 4: Min assist;With upper extremity assist;With armrests;To chair/3-in-1;Other (comment)   from RW or sink   Stand to Sit Details (indicate cue type and reason) Verbal cues for technique;Verbal cues for sequencing;Manual facilitation for weight shifting;Verbal cues for safe use of DME/AE    Squat Pivot Transfers 4: Min assist;With upper extremity assistance    Squat Pivot Transfer Details (indicate cue type and reason) to level surfaces      Ambulation/Gait   Ambulation/Gait Yes    Ambulation/Gait Assistance 3: Mod assist    Ambulation/Gait Assistance Details PT tactile & verbal cues on upright posture, weight shift to stance limb and RW control.     Ambulation Distance (Feet) 40 Feet   25' + 90* turn and 40' straight path   Assistive device Prostheses;Rolling walker    Gait Pattern Step-to pattern;Decreased step length - right;Decreased step length - left;Decreased hip/knee flexion - right;Decreased hip/knee flexion - left;Right foot flat;Left foot  flat;Right flexed knee in stance;Left flexed knee in stance;Shuffle;Lateral hip instability;Trunk flexed;Poor foot clearance - left;Poor foot clearance - right      Therapeutic Activites    Other Therapeutic Activities standing at sink for 3 minutes with upright trunk, right /left and anterior / lateral weight shifts.  Standing prior to gait to stretch.      Prosthetics   Current prosthetic wear tolerance (days/week)  daily    Current prosthetic wear tolerance (#hours/day)  most of awake hours - up to 6hrs without shrinker under liners.     Edema non-pitting    Residual limb condition  no open areas, normal color & temperature,                     PT Short Term Goals - 09/22/19 2014      PT SHORT TERM GOAL #1   Title Patient and family verbalize & demonstrate understanding of updated HEP.  (All updated STGs Target Date: 10/22/2019)    Time 4    Period Weeks    Status On-going    Target Date 10/22/19      PT SHORT TERM GOAL #2   Title Son able to assist sit to stand w/c to RW with PT supervising with no physical assist.    Time 4    Period Weeks    Status New    Target Date 10/22/19      PT SHORT TERM GOAL #3   Title Stand -pivot transfer with RW between w/c & chair with armrests with son's assist safely    Time 4    Period Weeks    Status Revised    Target Date 10/22/19      PT SHORT TERM GOAL #4   Title Standing balance with walker support reaches 10/22/2019 2 10/22/2019 2" anterior to RW with minA    Time 4    Period Weeks    Status Revised    Target Date 10/22/19      PT SHORT TERM GOAL #5   Title Patient ambulates 65' with RW & bilateral prostheses and turns 90* to position to sit with modA.    Time 4    Period Weeks    Status New    Target Date 10/22/19             PT  Long Term Goals - 09/22/19 2020      PT LONG TERM GOAL #1   Title Patient and family verbalize and demonstrate understanding of proper prosthetic care to enable safe utilization of  prostheses. (All LTGs Target Date: 09/24/2019)    Baseline Progressing 09/22/2019 but needs cues for problem solving issues. Family verbalizes & demonstrates understanding of basic concepts.    Time 3    Period Months    Status Partially Met      PT LONG TERM GOAL #2   Title Patient tolerates wear of bilateral prostheses >90% of awake hours without skin issues or limb pain to enable functional potential during his day.    Baseline MET 09/22/2019    Time 3    Period Months    Status Achieved      PT LONG TERM GOAL #3   Title Squat pivot transfer bed to w/c or chair to w/c with prostheses with supervision.    Baseline Progressing but not met 09/22/2019  squat pivot bed to w/c level surfaces with minA.    Time 3    Period Months    Status Not Met      PT LONG TERM GOAL #4   Title Sit to / from stand elevated w/c to walker with prostheses with minA    Baseline Progressing but not met 09/22/2019  sit to stand w/c to RW with modA    Time 3    Period Months    Status Not Met      PT LONG TERM GOAL #5   Title Standing balance with walker support with bilateral prostheses static stance for 2 minutes, scanning environment and reaches 2" anteriorly with supervision.    Baseline Progressing but not met 09/22/2019  Standing balance with RW support & bilateral prostheses static 2 minutes, scans with head motions & reaches 2" in front of RW with minA.    Time 3    Period Months    Status Not Met      PT LONG TERM GOAL #6   Title Patient ambulates 48' between 2 chairs including turning 90* to position to sit with RW & prostheses with minA from family safely.    Baseline Progressing but not met 09/22/2019  Patient ambulates 25' & turns 90* to position to sit with RW & prostheses with modA of PT.    Time 3    Period Months    Status Not Met              PT Long Term Goals - 09/22/19 2100      PT LONG TERM GOAL #1   Title Patient and family verbalize and demonstrate understanding of proper  prosthetic care to enable safe utilization of prostheses. (All LTGs Target Date: 12/17/2019)    Time 3    Period Months    Status On-going    Target Date 12/17/19      PT LONG TERM GOAL #2   Title Patient tolerates wear of bilateral prostheses >90% of awake hours without skin issues or limb pain to enable functional potential during his day.    Time 3    Period Months    Status On-going    Target Date 12/17/19      PT LONG TERM GOAL #3   Title Squat pivot or scooting transfer bed to w/c or chair to w/c with prostheses with supervision.    Time 3    Period Months    Status On-going  Target Date 12/17/19      PT LONG TERM GOAL #4   Title Sit to / from stand elevated w/c to walker with prostheses with minA    Time 3    Period Months    Status On-going    Target Date 12/17/19      PT LONG TERM GOAL #5   Title Standing balance with walker support with bilateral prostheses static stance for 2 minutes, scanning environment and reaches 2" anteriorly with supervision.    Time 3    Period Months    Status On-going    Target Date 12/17/19      PT LONG TERM GOAL #6   Title Patient ambulates 25' between 2 chairs including turning 90* to position to sit with RW & prostheses with minA from family safely.    Time 3    Period Months    Status On-going    Target Date 12/17/19                Plan - 09/22/19 2026    Clinical Impression Statement Patient has made progress towards LTGs but did not meet them yet. He has potential to meet the LTGs which enable mobility with family.  Patient would benefit from additional PT to meet this potential level of function.    Personal Factors and Comorbidities Age;Comorbidity 3+;Fitness;Time since onset of injury/illness/exacerbation    Comorbidities Bil TTAs, DM2, PAD, HTN, CHF, A-Fib, TIA, CKDst3,    Examination-Activity Limitations Locomotion Level;Sit;Stand;Toileting;Transfers    Examination-Participation Restrictions Community Activity     Stability/Clinical Decision Making Evolving/Moderate complexity    Rehab Potential Good    PT Frequency 2x / week    PT Duration 12 weeks   3 months   PT Treatment/Interventions ADLs/Self Care Home Management;DME Instruction;Gait training;Stair training;Functional mobility training;Therapeutic activities;Therapeutic exercise;Balance training;Neuromuscular re-education;Patient/family education;Prosthetic Training;Manual techniques;Passive range of motion;Vestibular    PT Next Visit Plan Work towards STGs, Prosthetic gait, Therapuetic exercise    Consulted and Agree with Plan of Care Patient;Family member/caregiver    Family Member Consulted son, Gustin Zobrist           Patient will benefit from skilled therapeutic intervention in order to improve the following deficits and impairments:  Abnormal gait, Cardiopulmonary status limiting activity, Decreased activity tolerance, Decreased balance, Decreased endurance, Decreased knowledge of use of DME, Decreased mobility, Decreased range of motion, Decreased scar mobility, Decreased strength, Increased edema, Impaired flexibility, Impaired UE functional use, Postural dysfunction, Prosthetic Dependency, Pain  Visit Diagnosis: Other abnormalities of gait and mobility  Unsteadiness on feet  Abnormal posture  Stiffness of right knee, not elsewhere classified  Stiffness of left knee, not elsewhere classified  Contracture of muscle, multiple sites  Localized edema  Muscle weakness (generalized)     Problem List Patient Active Problem List   Diagnosis Date Noted  . Weakness generalized 04/05/2019  . S/P BKA (below knee amputation) unilateral, left ([place]) 02/15/2019  . Pressure injury of skin 02/03/2019  . TIA (transient ischemic attack) 02/03/2019  . Transient speech disturbance 02/02/2019  . Chronic indwelling Foley catheter 01/22/2019  . Postoperative wound infection 01/20/2019  . Normocytic anemia 01/18/2019  . Cellulitis 01/18/2019    . Infection of deep incisional surgical site after procedure   . Abnormal urinalysis   . Labile blood glucose   . Urinary retention   . S/P bilateral BKA (below knee amputation) ([place])   . Acute blood loss anemia   . Urinary retention due to benign prostatic hyperplasia 12/28/2018  .  Unilateral complete BKA, left, initial encounter ([place]) 12/25/2018  . Dehiscence of amputation stump ([place])   . History of transmetatarsal amputation of left foot ([place]) 12/09/2018  . Critical limb ischemia with history of revascularization of same extremity   . Gangrene of left foot ([place])   . Diabetic ulcer of toe of left foot associated with type 2 diabetes mellitus ([place])   . Cellulitis of left lower extremity   . Toe infection 11/18/2018  . Peripheral edema   . Acute on chronic diastolic CHF (congestive heart failure) ([place]) 11/09/2018  . S/P BKA (below knee amputation) unilateral, right ([place]) 10/05/2018  . Hyponatremia   . Essential hypertension   . Postoperative pain   . Pain of left heel   . Unable to maintain weight-bearing   . Type 2 diabetes mellitus with diabetic peripheral angiopathy with gangrene ([place])   . Anemia due to acute blood loss   . Chronic kidney disease, stage 3a   . Acquired absence of right leg below knee ([place]) 09/07/2018  . Gangrene of right foot ([place])   . DNR (do not resuscitate) discussion   . Goals of care, counseling/discussion   . Palliative care by specialist   . Type 2 diabetes mellitus with vascular disease ([place])   . Severe protein-calorie malnutrition ([place])   . Ischemic foot 08/20/2018  . Critical lower limb ischemia 08/03/2018  . Type 2 diabetes mellitus with foot ulcer, without long-term current use of insulin ([place])   . Gastroesophageal reflux disease   . Cellulitis of great toe of right foot 05/29/2018  . Atrial fibrillation, chronic   . Chest pain 07/25/2017  . PAF (paroxysmal atrial fibrillation) ([place]) 07/25/2017  . BPH (benign prostatic hyperplasia) 07/25/2017   . Shortness of breath 05/11/2017  . Hypothyroidism 05/11/2017  . Chronic diastolic CHF (congestive heart failure) ([place]) 05/11/2017  . Moderate aortic stenosis 06/12/2016  . RBBB 06/12/2016  . Peripheral vascular disease ([place]) 08/04/2012  . Essential hypertension, benign 08/04/2012  . Hyperlipidemia 08/04/2012  . Dizziness 08/04/2012    Jamey Reas, PT, DPT 09/22/2019, 8:31 PM  [facility] [address] [place], [place], [address] Phone: [phone]   Fax:  [phone]  Name: TRIGG DELAROCHA MRN: [id number] Date of Birth: 05/20/1927

## 2019-09-23 ENCOUNTER — Encounter: Payer: Medicare Other | Admitting: Physical Therapy

## 2019-09-25 ENCOUNTER — Other Ambulatory Visit: Payer: Self-pay | Admitting: Physical Medicine and Rehabilitation

## 2019-09-27 ENCOUNTER — Other Ambulatory Visit: Payer: Self-pay

## 2019-09-27 ENCOUNTER — Encounter: Payer: Self-pay | Admitting: Physical Therapy

## 2019-09-27 ENCOUNTER — Ambulatory Visit: Payer: Medicare Other | Admitting: Physical Therapy

## 2019-09-27 VITALS — Wt 219.7 lb

## 2019-09-27 DIAGNOSIS — M25662 Stiffness of left knee, not elsewhere classified: Secondary | ICD-10-CM | POA: Diagnosis not present

## 2019-09-27 DIAGNOSIS — M6281 Muscle weakness (generalized): Secondary | ICD-10-CM

## 2019-09-27 DIAGNOSIS — R293 Abnormal posture: Secondary | ICD-10-CM

## 2019-09-27 DIAGNOSIS — R2681 Unsteadiness on feet: Secondary | ICD-10-CM | POA: Diagnosis not present

## 2019-09-27 DIAGNOSIS — R2689 Other abnormalities of gait and mobility: Secondary | ICD-10-CM

## 2019-09-27 DIAGNOSIS — R600 Localized edema: Secondary | ICD-10-CM

## 2019-09-27 DIAGNOSIS — M25661 Stiffness of right knee, not elsewhere classified: Secondary | ICD-10-CM

## 2019-09-27 DIAGNOSIS — M6249 Contracture of muscle, multiple sites: Secondary | ICD-10-CM

## 2019-09-27 NOTE — Therapy (Signed)
[facility] [address] [place], [place], [address] Phone: [phone]   Fax:  [phone]  Physical Therapy Treatment  Patient Details  Name: [name] MRN: [id number] Date of Birth: 03/11/1927 Referring Provider (PT): Travis Score, MD   Encounter Date: 09/27/2019   PT End of Session - 09/27/19 1201    Visit Number 24    Number of Visits 14    Date for PT Re-Evaluation 12/17/19    Authorization Type [employer] Medicare    Authorization Time Period $30 co-pay    Progress Note Due on Visit 43    PT Start Time 1145    PT Stop Time 1230    PT Time Calculation (min) 45 min    Equipment Utilized During Treatment Gait belt    Activity Tolerance Patient tolerated treatment well    Behavior During Therapy WFL for tasks assessed/performed           Past Medical History:  Diagnosis Date  . Arthritis   . Borderline diabetic   . Diabetes mellitus without complication ([place])   . Glaucoma   . Hyperlipidemia   . Hypertension   . PVD (peripheral vascular disease) ([place])   . Sleep apnea    Cpap ordered but doesnt use  . Urinary retention due to benign prostatic hyperplasia 12/28/2018    Past Surgical History:  Procedure Laterality Date  . ABDOMINAL AORTOGRAM W/LOWER EXTREMITY N/A 07/27/2018   Procedure: ABDOMINAL AORTOGRAM W/LOWER EXTREMITY;  Surgeon: Travis Harp, MD;  Location: [place] [facility];  Service: Cardiovascular;  Laterality: N/A;  . AMPUTATION Right 08/28/2018   Procedure: RIGHT BELOW KNEE AMPUTATION;  Surgeon: Travis Minion, MD;  Location: [place];  Service: Orthopedics;  Laterality: Right;  . AMPUTATION Left 12/02/2018   Procedure: LEFT TRANSMETATARSAL AMPUTATION AND NEGATIVE PRESSURE WOUND VAC PLACEMENT;  Surgeon: Travis Minion, MD;  Location: [place];  Service: Orthopedics;  Laterality: Left;  . AMPUTATION Left 12/18/2018   Procedure: LEFT BELOW KNEE AMPUTATION;  Surgeon: Travis Minion, MD;  Location: [place];  Service:  Orthopedics;  Laterality: Left;  . APPENDECTOMY  1943  . BELOW KNEE LEG AMPUTATION Left 12/18/2018  . CATARACT EXTRACTION  2012   x2  . COLONOSCOPY N/A 11/01/2014   Procedure: COLONOSCOPY;  Surgeon: Travis Signs, MD;  [facility];  Service: Gastroenterology;  Laterality: N/A;  . ESOPHAGOGASTRODUODENOSCOPY N/A 11/01/2014   Procedure: ESOPHAGOGASTRODUODENOSCOPY (EGD);  Surgeon: Travis Signs, MD;  [facility];  Service: Gastroenterology;  Laterality: N/A;  . HERNIA REPAIR Left   . INGUINAL HERNIA REPAIR Right 03/01/2013   Procedure: RIGHT INGUINAL HERNIORRHAPHY;  Surgeon: Travis So, MD;  Location: [facility];  Service: General;  Laterality: Right;  . INSERTION OF MESH Right 03/01/2013   Procedure: INSERTION OF MESH;  Surgeon: Travis So, MD;  Location: [facility];  Service: General;  Laterality: Right;  . LOWER EXTREMITY ANGIOGRAPHY Right 08/25/2018   Procedure: LOWER EXTREMITY ANGIOGRAPHY;  Surgeon: Travis Hampshire, MD;  Location: [place] [facility];  Service: Cardiovascular;  Laterality: Right;  . LOWER EXTREMITY ANGIOGRAPHY N/A 11/25/2018   Procedure: LOWER EXTREMITY ANGIOGRAPHY;  Surgeon: Travis Hampshire, MD;  Location: [place] [facility];  Service: Cardiovascular;  Laterality: N/A;  . NM MYOCAR PERF WALL MOTION  06/05/2010   no significant ischemia  . PERIPHERAL VASCULAR ATHERECTOMY Right 08/03/2018   Procedure: PERIPHERAL VASCULAR ATHERECTOMY;  Surgeon: Travis Harp, MD;  Location: [place] [facility];  Service:  Cardiovascular;  Laterality: Right;  . PERIPHERAL VASCULAR ATHERECTOMY Left 11/23/2018   Procedure: PERIPHERAL VASCULAR ATHERECTOMY;  Surgeon: Travis Harp, MD;  Location: [place] [facility];  Service: Cardiovascular;  Laterality: Left;  sfa with dc balloon  . PERIPHERAL VASCULAR BALLOON ANGIOPLASTY Left 11/25/2018   Procedure: PERIPHERAL VASCULAR BALLOON ANGIOPLASTY;  Surgeon: Travis Hampshire, MD;  Location: [place] [facility];  Service:  Cardiovascular;  Laterality: Left;  popliteal  . PERIPHERAL VASCULAR INTERVENTION Left 11/25/2018   Procedure: PERIPHERAL VASCULAR INTERVENTION;  Surgeon: Travis Hampshire, MD;  Location: [place] [facility];  Service: Cardiovascular;  Laterality: Left;  tibial peroneal trunk and peroneal stents   . stent  06/14/2010   left leg  . STUMP REVISION Left 01/20/2019   Procedure: REVISION LEFT BELOW KNEE AMPUTATION;  Surgeon: Travis Minion, MD;  Location: [place];  Service: Orthopedics;  Laterality: Left;  . US ECHOCARDIOGRAPHY  01/21/2006   moderate mitral annular ca+, mild MR, AOV moderately sclerotic.    Vitals:   09/27/19 1633  Weight: 219 lb 11.2 oz (99.7 kg)     Subjective Assessment - 09/27/19 1145    Subjective He worked with his daughter over weekend on standing & exercises.    Patient is accompained by: Family member   son, [name]   Pertinent History Bil TTAs, DM2, PAD, HTN, CHF, A-Fib, TIA, CKDst3    Patient Stated Goals walk with prostheses in home & in/out house. Son would like him to be able to toilet & get in/out bed by himself.    Currently in Pain? No/denies    Pain Onset In the past 7 days                             [facility] Adult PT Treatment/Exercise - 09/27/19 1145      Transfers   Transfers Sit to Stand;Stand to [employer] Pivot Transfers    Sit to Stand 3: Mod assist;With upper extremity assist;With armrests;From chair/3-in-1;Other (comment);4: Min guard   to RW modA & min guard pulling to sink    Sit to Stand Details Manual facilitation for weight shifting;Verbal cues for safe use of DME/AE;Verbal cues for technique;Verbal cues for sequencing    Stand to Sit 4: Min assist;With upper extremity assist;With armrests;To chair/3-in-1;Other (comment)   from RW or sink   Stand to Sit Details (indicate cue type and reason) Verbal cues for technique;Verbal cues for sequencing;Manual facilitation for weight shifting;Verbal cues for safe  use of DME/AE    Squat Pivot Transfers --      Ambulation/Gait   Ambulation/Gait Yes    Ambulation/Gait Assistance 3: Mod assist    Ambulation/Gait Assistance Details PT manual & tactile cues on upright posture, RW position / control & wt shift over stance limb.     Ambulation Distance (Feet) 30 Feet   25' + 90* turn and 30' straight path   Assistive device Prostheses;Rolling walker    Gait Pattern Step-to pattern;Decreased step length - right;Decreased step length - left;Decreased hip/knee flexion - right;Decreased hip/knee flexion - left;Right foot flat;Left foot flat;Right flexed knee in stance;Left flexed knee in stance;Shuffle;Lateral hip instability;Trunk flexed;Poor foot clearance - left;Poor foot clearance - right      Posture/Postural Control   Posture/Postural Control --    Postural Limitations --      Therapeutic Activites    Other Therapeutic Activities standing at sink for 3 minutes with upright trunk, right /  left and anterior / lateral weight shifts.  Standing prior to gait to stretch.      Knee/Hip Exercises: Seated   Other Seated Knee/Hip Exercises seated leg press green theraband 10 reps ea LE.        Prosthetics   Prosthetic Care Comments  reviewed adjusting ply socks & donning including socks without stretching socks.      Current prosthetic wear tolerance (days/week)  daily    Current prosthetic wear tolerance (#hours/day)  most of awake hours - up to 6hrs without shrinker under liners.     Edema non-pitting    Residual limb condition  no open areas, normal color & temperature,     Education Provided Proper [place];Other (comment);Correct ply sock adjustment   see prosthetic care comments   Person(s) Educated Patient;Child(ren)    Education Method Explanation;Demonstration;Verbal cues    Education Method Verbalized understanding;Verbal cues required                    PT Short Term Goals - 09/27/19 1629      PT SHORT TERM GOAL #1   Title Patient and  family verbalize & demonstrate understanding of updated HEP.  (All updated STGs Target Date: 10/22/2019)    Time 4    Period Weeks    Status On-going    Target Date 10/22/19      PT SHORT TERM GOAL #2   Title Son able to assist sit to stand w/c to RW with PT supervising with no physical assist.    Time 4    Period Weeks    Status On-going    Target Date 10/22/19      PT SHORT TERM GOAL #3   Title Stand -pivot transfer with RW between w/c & chair with armrests with son's assist safely    Time 4    Period Weeks    Status On-going    Target Date 10/22/19      PT SHORT TERM GOAL #4   Title Standing balance with walker support reaches 2" anterior to RW with minA    Time 4    Period Weeks    Status On-going    Target Date 10/22/19      PT SHORT TERM GOAL #5   Title Patient ambulates 26' with RW & bilateral prostheses and turns 90* to position to sit with modA.    Time 4    Period Weeks    Status On-going    Target Date 10/22/19             PT Long Term Goals - 09/22/19 2100      PT LONG TERM GOAL #1   Title Patient and family verbalize and demonstrate understanding of proper prosthetic care to enable safe utilization of prostheses. (All LTGs Target Date: 12/17/2019)    Time 3    Period Months    Status On-going    Target Date 12/17/19      PT LONG TERM GOAL #2   Title Patient tolerates wear of bilateral prostheses >90% of awake hours without skin issues or limb pain to enable functional potential during his day.    Time 3    Period Months    Status On-going    Target Date 12/17/19      PT LONG TERM GOAL #3   Title Squat pivot or scooting transfer bed to w/c or chair to w/c with prostheses with supervision.    Time 3    Period Months  Status On-going    Target Date 12/17/19      PT LONG TERM GOAL #4   Title Sit to / from stand elevated w/c to walker with prostheses with minA    Time 3    Period Months    Status On-going    Target Date 12/17/19      PT  LONG TERM GOAL #5   Title Standing balance with walker support with bilateral prostheses static stance for 2 minutes, scanning environment and reaches 2" anteriorly with supervision.    Time 3    Period Months    Status On-going    Target Date 12/17/19      PT LONG TERM GOAL #6   Title Patient ambulates 25' between 2 chairs including turning 90* to position to sit with RW & prostheses with minA from family safely.    Time 3    Period Months    Status On-going    Target Date 12/17/19                 Plan - 09/27/19 1202    Clinical Impression Statement PT scheduled Brooke Pace, [facility] at next appointment.  PT reviewed adjusting proper ply socks.  PT session working on upright posture, transfers & prosthetic gait.    Personal Factors and Comorbidities Age;Comorbidity 3+;Fitness;Time since onset of injury/illness/exacerbation    Comorbidities Bil TTAs, DM2, PAD, HTN, CHF, A-Fib, TIA, CKDst3,    Examination-Activity Limitations Locomotion Level;Sit;Stand;Toileting;Transfers    Examination-Participation Restrictions Community Activity    Stability/Clinical Decision Making Evolving/Moderate complexity    Rehab Potential Good    PT Frequency 2x / week    PT Duration 12 weeks   3 months   PT Treatment/Interventions ADLs/Self Care Home Management;DME Instruction;Gait training;Stair training;Functional mobility training;Therapeutic activities;Therapeutic exercise;Balance training;Neuromuscular re-education;Patient/family education;Prosthetic Training;Manual techniques;Passive range of motion;Vestibular    PT Next Visit Plan Work towards STGs, Prosthetic gait, Therapuetic exercise    Consulted and Agree with Plan of Care Patient;Family member/caregiver    Family Member Consulted son, Echo [place]           Patient will benefit from skilled therapeutic intervention in order to improve the following deficits and impairments:  Abnormal gait, Cardiopulmonary status limiting activity, Decreased  activity tolerance, Decreased balance, Decreased endurance, Decreased knowledge of use of DME, Decreased mobility, Decreased range of motion, Decreased scar mobility, Decreased strength, Increased edema, Impaired flexibility, Impaired UE functional use, Postural dysfunction, Prosthetic Dependency, Pain  Visit Diagnosis: Unsteadiness on feet  Other abnormalities of gait and mobility  Abnormal posture  Stiffness of right knee, not elsewhere classified  Stiffness of left knee, not elsewhere classified  Contracture of muscle, multiple sites  Localized edema  Muscle weakness (generalized)     Problem List Patient Active Problem List   Diagnosis Date Noted  . Weakness generalized 04/05/2019  . S/P BKA (below knee amputation) unilateral, left ([place]) 02/15/2019  . Pressure injury of skin 02/03/2019  . TIA (transient ischemic attack) 02/03/2019  . Transient speech disturbance 02/02/2019  . Chronic indwelling Foley catheter 01/22/2019  . Postoperative wound infection 01/20/2019  . Normocytic anemia 01/18/2019  . Cellulitis 01/18/2019  . Infection of deep incisional surgical site after procedure   . Abnormal urinalysis   . Labile blood glucose   . Urinary retention   . S/P bilateral BKA (below knee amputation) ([place])   . Acute blood loss anemia   . Urinary retention due to benign prostatic hyperplasia 12/28/2018  . Unilateral complete BKA, left, initial  encounter ([place]) 12/25/2018  . Dehiscence of amputation stump ([place])   . History of transmetatarsal amputation of left foot ([place]) 12/09/2018  . Critical limb ischemia with history of revascularization of same extremity   . Gangrene of left foot ([place])   . Diabetic ulcer of toe of left foot associated with type 2 diabetes mellitus ([place])   . Cellulitis of left lower extremity   . Toe infection 11/18/2018  . Peripheral edema   . Acute on chronic diastolic CHF (congestive heart failure) ([place]) 11/09/2018  . S/P BKA (below knee  amputation) unilateral, right ([place]) 10/05/2018  . Hyponatremia   . Essential hypertension   . Postoperative pain   . Pain of left heel   . Unable to maintain weight-bearing   . Type 2 diabetes mellitus with diabetic peripheral angiopathy with gangrene ([place])   . Anemia due to acute blood loss   . Chronic kidney disease, stage 3a   . Acquired absence of right leg below knee ([place]) 09/07/2018  . Gangrene of right foot ([place])   . DNR (do not resuscitate) discussion   . Goals of care, counseling/discussion   . Palliative care by specialist   . Type 2 diabetes mellitus with vascular disease ([place])   . Severe protein-calorie malnutrition ([place])   . Ischemic foot 08/20/2018  . Critical lower limb ischemia 08/03/2018  . Type 2 diabetes mellitus with foot ulcer, without long-term current use of insulin ([place])   . Gastroesophageal reflux disease   . Cellulitis of great toe of right foot 05/29/2018  . Atrial fibrillation, chronic   . Chest pain 07/25/2017  . PAF (paroxysmal atrial fibrillation) ([place]) 07/25/2017  . BPH (benign prostatic hyperplasia) 07/25/2017  . Shortness of breath 05/11/2017  . Hypothyroidism 05/11/2017  . Chronic diastolic CHF (congestive heart failure) ([place]) 05/11/2017  . Moderate aortic stenosis 06/12/2016  . RBBB 06/12/2016  . Peripheral vascular disease ([place]) 08/04/2012  . Essential hypertension, benign 08/04/2012  . Hyperlipidemia 08/04/2012  . Dizziness 08/04/2012    Jamey Reas PT, DPT 09/27/2019, 4:33 PM  [facility] [address] [place], [place], [address] Phone: [phone]   Fax:  [phone]  Name: [name] MRN: [id number] Date of Birth: July 05, 1927

## 2019-09-28 ENCOUNTER — Encounter (HOSPITAL_COMMUNITY): Payer: Medicare Other

## 2019-09-29 ENCOUNTER — Encounter: Payer: Self-pay | Admitting: Physical Therapy

## 2019-09-29 ENCOUNTER — Other Ambulatory Visit: Payer: Self-pay

## 2019-09-29 ENCOUNTER — Ambulatory Visit: Payer: Medicare Other | Admitting: Physical Therapy

## 2019-09-29 DIAGNOSIS — R2689 Other abnormalities of gait and mobility: Secondary | ICD-10-CM

## 2019-09-29 DIAGNOSIS — R2681 Unsteadiness on feet: Secondary | ICD-10-CM | POA: Diagnosis not present

## 2019-09-29 DIAGNOSIS — R600 Localized edema: Secondary | ICD-10-CM

## 2019-09-29 DIAGNOSIS — M25661 Stiffness of right knee, not elsewhere classified: Secondary | ICD-10-CM | POA: Diagnosis not present

## 2019-09-29 DIAGNOSIS — M25662 Stiffness of left knee, not elsewhere classified: Secondary | ICD-10-CM | POA: Diagnosis not present

## 2019-09-29 DIAGNOSIS — M6281 Muscle weakness (generalized): Secondary | ICD-10-CM

## 2019-09-29 DIAGNOSIS — M6249 Contracture of muscle, multiple sites: Secondary | ICD-10-CM

## 2019-09-29 DIAGNOSIS — R293 Abnormal posture: Secondary | ICD-10-CM | POA: Diagnosis not present

## 2019-09-30 NOTE — Therapy (Signed)
[facility] [address] [place], [place], [address] Phone: [phone]   Fax:  [phone]  Physical Therapy Treatment  Patient Details  Name: [name] MRN: [id number] Date of Birth: 1927/02/16 Referring Provider (PT): [name], MD   Encounter Date: 09/29/2019   PT End of Session - 09/29/19 1145    Visit Number 60    Number of Visits 71    Date for PT Re-Evaluation 12/17/19    Authorization Type [employer] Medicare    Authorization Time Period $30 co-pay    Progress Note Due on Visit 63    PT Start Time 1145    PT Stop Time 1230    PT Time Calculation (min) 45 min    Equipment Utilized During Treatment Gait belt    Activity Tolerance Patient tolerated treatment well    Behavior During Therapy WFL for tasks assessed/performed           Past Medical History:  Diagnosis Date  . Arthritis   . Borderline diabetic   . Diabetes mellitus without complication ([place])   . Glaucoma   . Hyperlipidemia   . Hypertension   . PVD (peripheral vascular disease) ([place])   . Sleep apnea    Cpap ordered but doesnt use  . Urinary retention due to benign prostatic hyperplasia 12/28/2018    Past Surgical History:  Procedure Laterality Date  . ABDOMINAL AORTOGRAM W/LOWER EXTREMITY N/A 07/27/2018   Procedure: ABDOMINAL AORTOGRAM W/LOWER EXTREMITY;  Surgeon: Lorretta Harp, MD;  Location: [place] [facility];  Service: Cardiovascular;  Laterality: N/A;  . AMPUTATION Right 08/28/2018   Procedure: RIGHT BELOW KNEE AMPUTATION;  Surgeon: [name], MD;  Location: [place];  Service: Orthopedics;  Laterality: Right;  . AMPUTATION Left 12/02/2018   Procedure: LEFT TRANSMETATARSAL AMPUTATION AND NEGATIVE PRESSURE WOUND VAC PLACEMENT;  Surgeon: [name], MD;  Location: [facility];  Service: Orthopedics;  Laterality: Left;  . AMPUTATION Left 12/18/2018   Procedure: LEFT BELOW KNEE AMPUTATION;  Surgeon: [name], MD;  Location: [place];  Service:  Orthopedics;  Laterality: Left;  . APPENDECTOMY  1943  . BELOW KNEE LEG AMPUTATION Left 12/18/2018  . CATARACT EXTRACTION  2012   x2  . COLONOSCOPY N/A 11/01/2014   Procedure: COLONOSCOPY;  Surgeon: Aviva Signs, MD;  [facility];  Service: Gastroenterology;  Laterality: N/A;  . ESOPHAGOGASTRODUODENOSCOPY N/A 11/01/2014   Procedure: ESOPHAGOGASTRODUODENOSCOPY (EGD);  Surgeon: Aviva Signs, MD;  [facility];  Service: Gastroenterology;  Laterality: N/A;  . HERNIA REPAIR Left   . INGUINAL HERNIA REPAIR Right 03/01/2013   Procedure: RIGHT INGUINAL HERNIORRHAPHY;  Surgeon: Jamesetta So, MD;  Location: [facility];  Service: General;  Laterality: Right;  . INSERTION OF MESH Right 03/01/2013   Procedure: INSERTION OF MESH;  Surgeon: Jamesetta So, MD;  Location: [facility];  Service: General;  Laterality: Right;  . LOWER EXTREMITY ANGIOGRAPHY Right 08/25/2018   Procedure: LOWER EXTREMITY ANGIOGRAPHY;  Surgeon: Wellington Hampshire, MD;  Location: [facility];  Service: Cardiovascular;  Laterality: Right;  . LOWER EXTREMITY ANGIOGRAPHY N/A 11/25/2018   Procedure: LOWER EXTREMITY ANGIOGRAPHY;  Surgeon: Wellington Hampshire, MD;  Location: [facility];  Service: Cardiovascular;  Laterality: N/A;  . NM MYOCAR PERF WALL MOTION  06/05/2010   no significant ischemia  . PERIPHERAL VASCULAR ATHERECTOMY Right 08/03/2018   Procedure: PERIPHERAL VASCULAR ATHERECTOMY;  Surgeon: Lorretta Harp, MD;  Location: [facility];  Service:  Cardiovascular;  Laterality: Right;  . PERIPHERAL VASCULAR ATHERECTOMY Left 11/23/2018   Procedure: PERIPHERAL VASCULAR ATHERECTOMY;  Surgeon: Lorretta Harp, MD;  Location: [place] [facility];  Service: Cardiovascular;  Laterality: Left;  sfa with dc balloon  . PERIPHERAL VASCULAR BALLOON ANGIOPLASTY Left 11/25/2018   Procedure: PERIPHERAL VASCULAR BALLOON ANGIOPLASTY;  Surgeon: Wellington Hampshire, MD;  Location: [facility];  Service:  Cardiovascular;  Laterality: Left;  popliteal  . PERIPHERAL VASCULAR INTERVENTION Left 11/25/2018   Procedure: PERIPHERAL VASCULAR INTERVENTION;  Surgeon: Wellington Hampshire, MD;  Location: [facility];  Service: Cardiovascular;  Laterality: Left;  tibial peroneal trunk and peroneal stents   . stent  06/14/2010   left leg  . STUMP REVISION Left 01/20/2019   Procedure: REVISION LEFT BELOW KNEE AMPUTATION;  Surgeon: [name], MD;  Location: [facility];  Service: Orthopedics;  Laterality: Left;  . US ECHOCARDIOGRAPHY  01/21/2006   moderate mitral annular ca+, mild MR, AOV moderately sclerotic.    There were no vitals filed for this visit.   Subjective Assessment - 09/29/19 1145    Subjective His son reports improved standing at sink at home.    Patient is accompained by: Family member   son, Elizjah Noblet   Pertinent History Bil TTAs, DM2, PAD, HTN, CHF, A-Fib, TIA, CKDst3    Patient Stated Goals walk with prostheses in home & in/out house. Son would like him to be able to toilet & get in/out bed by himself.    Currently in Pain? No/denies    Pain Onset In the past 7 days                             [facility] Adult PT Treatment/Exercise - 09/29/19 1145      Transfers   Transfers Sit to Stand;Stand to [employer] Pivot Transfers    Sit to Stand 3: Mod assist;With upper extremity assist;With armrests;From chair/3-in-1;Other (comment);5: Supervision   to RW modA & supervision pulling to sink PT blocking feet   Sit to Stand Details Manual facilitation for weight shifting;Verbal cues for safe use of DME/AE;Verbal cues for technique;Verbal cues for sequencing    Stand to Sit 4: Min assist;With upper extremity assist;With armrests;To chair/3-in-1;Other (comment);5: Supervision   minA from RW or supervision from sink   Stand to Sit Details (indicate cue type and reason) Verbal cues for technique;Verbal cues for sequencing;Manual facilitation for weight  shifting;Verbal cues for safe use of DME/AE      Ambulation/Gait   Ambulation/Gait Yes    Ambulation/Gait Assistance 3: Mod assist    Ambulation/Gait Assistance Details Brooke Pace, [facility] present for adjustments after gait analysis.  Pt's knees appeared more stable after adjustment and pt reported that they felt better.     Ambulation Distance (Feet) 30 Feet   20' + 90* turn and 15' & 30' straight path   Assistive device Prostheses;Rolling walker    Gait Pattern Step-to pattern;Decreased step length - right;Decreased step length - left;Decreased hip/knee flexion - right;Decreased hip/knee flexion - left;Right foot flat;Left foot flat;Right flexed knee in stance;Left flexed knee in stance;Shuffle;Lateral hip instability;Trunk flexed;Poor foot clearance - left;Poor foot clearance - right      Therapeutic Activites    Other Therapeutic Activities standing at sink for 3 minutes with upright trunk, right /left and anterior / lateral weight shifts and mini squats 5 reps.  Standing prior to gait to stretch.  Prosthetics   Current prosthetic wear tolerance (days/week)  daily    Current prosthetic wear tolerance (#hours/day)  most of awake hours - up to 6hrs without shrinker under liners.     Edema non-pitting    Residual limb condition  no open areas, normal color & temperature,                     PT Short Term Goals - 09/27/19 1629      PT SHORT TERM GOAL #1   Title Patient and family verbalize & demonstrate understanding of updated HEP.  (All updated STGs Target Date: 10/22/2019)    Time 4    Period Weeks    Status On-going    Target Date 10/22/19      PT SHORT TERM GOAL #2   Title Son able to assist sit to stand w/c to RW with PT supervising with no physical assist.    Time 4    Period Weeks    Status On-going    Target Date 10/22/19      PT SHORT TERM GOAL #3   Title Stand -pivot transfer with RW between w/c & chair with armrests with son's assist safely    Time 4     Period Weeks    Status On-going    Target Date 10/22/19      PT SHORT TERM GOAL #4   Title Standing balance with walker support reaches 2" anterior to RW with minA    Time 4    Period Weeks    Status On-going    Target Date 10/22/19      PT SHORT TERM GOAL #5   Title Patient ambulates 40' with RW & bilateral prostheses and turns 90* to position to sit with modA.    Time 4    Period Weeks    Status On-going    Target Date 10/22/19             PT Long Term Goals - 09/22/19 2100      PT LONG TERM GOAL #1   Title Patient and family verbalize and demonstrate understanding of proper prosthetic care to enable safe utilization of prostheses. (All LTGs Target Date: 12/17/2019)    Time 3    Period Months    Status On-going    Target Date 12/17/19      PT LONG TERM GOAL #2   Title Patient tolerates wear of bilateral prostheses >90% of awake hours without skin issues or limb pain to enable functional potential during his day.    Time 3    Period Months    Status On-going    Target Date 12/17/19      PT LONG TERM GOAL #3   Title Squat pivot or scooting transfer bed to w/c or chair to w/c with prostheses with supervision.    Time 3    Period Months    Status On-going    Target Date 12/17/19      PT LONG TERM GOAL #4   Title Sit to / from stand elevated w/c to walker with prostheses with minA    Time 3    Period Months    Status On-going    Target Date 12/17/19      PT LONG TERM GOAL #5   Title Standing balance with walker support with bilateral prostheses static stance for 2 minutes, scanning environment and reaches 2" anteriorly with supervision.    Time 3    Period Months  Status On-going    Target Date 12/17/19      PT LONG TERM GOAL #6   Title Patient ambulates 25' between 2 chairs including turning 90* to position to sit with RW & prostheses with minA from family safely.    Time 3    Period Months    Status On-going    Target Date 12/17/19                  Plan - 09/29/19 2018    Clinical Impression Statement Prosthetist present today at PT request for alignment analysis & adjustment.  Patient's gait / knees appeared more stable after adjustments.    Personal Factors and Comorbidities Age;Comorbidity 3+;Fitness;Time since onset of injury/illness/exacerbation    Comorbidities Bil TTAs, DM2, PAD, HTN, CHF, A-Fib, TIA, CKDst3,    Examination-Activity Limitations Locomotion Level;Sit;Stand;Toileting;Transfers    Examination-Participation Restrictions Community Activity    Stability/Clinical Decision Making Evolving/Moderate complexity    Rehab Potential Good    PT Frequency 2x / week    PT Duration 12 weeks   3 months   PT Treatment/Interventions ADLs/Self Care Home Management;DME Instruction;Gait training;Stair training;Functional mobility training;Therapeutic activities;Therapeutic exercise;Balance training;Neuromuscular re-education;Patient/family education;Prosthetic Training;Manual techniques;Passive range of motion;Vestibular    PT Next Visit Plan Work towards STGs, Prosthetic gait, Therapuetic exercise    Consulted and Agree with Plan of Care Patient;Family member/caregiver    Family Member Consulted son, Dayon Witt           Patient will benefit from skilled therapeutic intervention in order to improve the following deficits and impairments:  Abnormal gait, Cardiopulmonary status limiting activity, Decreased activity tolerance, Decreased balance, Decreased endurance, Decreased knowledge of use of DME, Decreased mobility, Decreased range of motion, Decreased scar mobility, Decreased strength, Increased edema, Impaired flexibility, Impaired UE functional use, Postural dysfunction, Prosthetic Dependency, Pain  Visit Diagnosis: Other abnormalities of gait and mobility  Unsteadiness on feet  Abnormal posture  Stiffness of right knee, not elsewhere classified  Stiffness of left knee, not elsewhere classified  Contracture  of muscle, multiple sites  Localized edema  Muscle weakness (generalized)     Problem List Patient Active Problem List   Diagnosis Date Noted  . Weakness generalized 04/05/2019  . S/P BKA (below knee amputation) unilateral, left ([place]) 02/15/2019  . Pressure injury of skin 02/03/2019  . TIA (transient ischemic attack) 02/03/2019  . Transient speech disturbance 02/02/2019  . Chronic indwelling Foley catheter 01/22/2019  . Postoperative wound infection 01/20/2019  . Normocytic anemia 01/18/2019  . Cellulitis 01/18/2019  . Infection of deep incisional surgical site after procedure   . Abnormal urinalysis   . Labile blood glucose   . Urinary retention   . S/P bilateral BKA (below knee amputation) ([place])   . Acute blood loss anemia   . Urinary retention due to benign prostatic hyperplasia 12/28/2018  . Unilateral complete BKA, left, initial encounter ([place]) 12/25/2018  . Dehiscence of amputation stump ([place])   . History of transmetatarsal amputation of left foot ([place]) 12/09/2018  . Critical limb ischemia with history of revascularization of same extremity   . Gangrene of left foot ([place])   . Diabetic ulcer of toe of left foot associated with type 2 diabetes mellitus ([place])   . Cellulitis of left lower extremity   . Toe infection 11/18/2018  . Peripheral edema   . Acute on chronic diastolic CHF (congestive heart failure) ([place]) 11/09/2018  . S/P BKA (below knee amputation) unilateral, right ([place]) 10/05/2018  . Hyponatremia   .  Essential hypertension   . Postoperative pain   . Pain of left heel   . Unable to maintain weight-bearing   . Type 2 diabetes mellitus with diabetic peripheral angiopathy with gangrene ([place])   . Anemia due to acute blood loss   . Chronic kidney disease, stage 3a   . Acquired absence of right leg below knee ([place]) 09/07/2018  . Gangrene of right foot ([place])   . DNR (do not resuscitate) discussion   . Goals of care, counseling/discussion   . Palliative care by  specialist   . Type 2 diabetes mellitus with vascular disease ([place])   . Severe protein-calorie malnutrition ([place])   . Ischemic foot 08/20/2018  . Critical lower limb ischemia 08/03/2018  . Type 2 diabetes mellitus with foot ulcer, without long-term current use of insulin ([place])   . Gastroesophageal reflux disease   . Cellulitis of great toe of right foot 05/29/2018  . Atrial fibrillation, chronic   . Chest pain 07/25/2017  . PAF (paroxysmal atrial fibrillation) ([place]) 07/25/2017  . BPH (benign prostatic hyperplasia) 07/25/2017  . Shortness of breath 05/11/2017  . Hypothyroidism 05/11/2017  . Chronic diastolic CHF (congestive heart failure) ([place]) 05/11/2017  . Moderate aortic stenosis 06/12/2016  . RBBB 06/12/2016  . Peripheral vascular disease ([place]) 08/04/2012  . Essential hypertension, benign 08/04/2012  . Hyperlipidemia 08/04/2012  . Dizziness 08/04/2012    Jamey Reas PT, DPT 09/30/2019, 7:19 AM  [facility] [address] [place], [place], [address] Phone: [phone]   Fax:  [phone]  Name: [name] MRN: [id number] Date of Birth: 1927/11/19

## 2019-10-04 ENCOUNTER — Ambulatory Visit: Payer: Medicare Other | Admitting: Physical Therapy

## 2019-10-04 ENCOUNTER — Telehealth: Payer: Self-pay | Admitting: Internal Medicine

## 2019-10-04 ENCOUNTER — Inpatient Hospital Stay (HOSPITAL_COMMUNITY)
Admission: EM | Admit: 2019-10-04 | Discharge: 2019-10-11 | DRG: 280 | Disposition: A | Discharge status: Home / Self Care | Payer: Medicare Other | Attending: Internal Medicine | Admitting: Internal Medicine

## 2019-10-04 ENCOUNTER — Encounter: Payer: Self-pay | Admitting: Physical Therapy

## 2019-10-04 ENCOUNTER — Other Ambulatory Visit: Payer: Self-pay

## 2019-10-04 ENCOUNTER — Emergency Department (HOSPITAL_COMMUNITY): Payer: Medicare Other

## 2019-10-04 ENCOUNTER — Encounter (HOSPITAL_COMMUNITY): Payer: Self-pay

## 2019-10-04 VITALS — Wt 221.7 lb

## 2019-10-04 DIAGNOSIS — K219 Gastro-esophageal reflux disease without esophagitis: Secondary | ICD-10-CM | POA: Diagnosis not present

## 2019-10-04 DIAGNOSIS — N390 Urinary tract infection, site not specified: Secondary | ICD-10-CM | POA: Diagnosis present

## 2019-10-04 DIAGNOSIS — N170 Acute kidney failure with tubular necrosis: Secondary | ICD-10-CM | POA: Diagnosis not present

## 2019-10-04 DIAGNOSIS — Z7984 Long term (current) use of oral hypoglycemic drugs: Secondary | ICD-10-CM

## 2019-10-04 DIAGNOSIS — E1159 Type 2 diabetes mellitus with other circulatory complications: Secondary | ICD-10-CM | POA: Diagnosis present

## 2019-10-04 DIAGNOSIS — E876 Hypokalemia: Secondary | ICD-10-CM

## 2019-10-04 DIAGNOSIS — Z66 Do not resuscitate: Secondary | ICD-10-CM | POA: Diagnosis present

## 2019-10-04 DIAGNOSIS — I350 Nonrheumatic aortic (valve) stenosis: Secondary | ICD-10-CM | POA: Diagnosis present

## 2019-10-04 DIAGNOSIS — R293 Abnormal posture: Secondary | ICD-10-CM

## 2019-10-04 DIAGNOSIS — M25662 Stiffness of left knee, not elsewhere classified: Secondary | ICD-10-CM

## 2019-10-04 DIAGNOSIS — R2689 Other abnormalities of gait and mobility: Secondary | ICD-10-CM

## 2019-10-04 DIAGNOSIS — E039 Hypothyroidism, unspecified: Secondary | ICD-10-CM | POA: Diagnosis present

## 2019-10-04 DIAGNOSIS — Z20822 Contact with and (suspected) exposure to covid-19: Secondary | ICD-10-CM | POA: Diagnosis present

## 2019-10-04 DIAGNOSIS — I5032 Chronic diastolic (congestive) heart failure: Secondary | ICD-10-CM | POA: Diagnosis present

## 2019-10-04 DIAGNOSIS — E785 Hyperlipidemia, unspecified: Secondary | ICD-10-CM | POA: Diagnosis present

## 2019-10-04 DIAGNOSIS — I4510 Unspecified right bundle-branch block: Secondary | ICD-10-CM | POA: Diagnosis present

## 2019-10-04 DIAGNOSIS — E1165 Type 2 diabetes mellitus with hyperglycemia: Secondary | ICD-10-CM | POA: Diagnosis not present

## 2019-10-04 DIAGNOSIS — Z9102 Food additives allergy status: Secondary | ICD-10-CM

## 2019-10-04 DIAGNOSIS — I480 Paroxysmal atrial fibrillation: Secondary | ICD-10-CM | POA: Diagnosis present

## 2019-10-04 DIAGNOSIS — I482 Chronic atrial fibrillation, unspecified: Secondary | ICD-10-CM | POA: Diagnosis present

## 2019-10-04 DIAGNOSIS — Z89511 Acquired absence of right leg below knee: Secondary | ICD-10-CM | POA: Diagnosis not present

## 2019-10-04 DIAGNOSIS — E871 Hypo-osmolality and hyponatremia: Secondary | ICD-10-CM | POA: Diagnosis not present

## 2019-10-04 DIAGNOSIS — I4819 Other persistent atrial fibrillation: Secondary | ICD-10-CM | POA: Diagnosis not present

## 2019-10-04 DIAGNOSIS — R079 Chest pain, unspecified: Secondary | ICD-10-CM

## 2019-10-04 DIAGNOSIS — R0789 Other chest pain: Secondary | ICD-10-CM | POA: Diagnosis not present

## 2019-10-04 DIAGNOSIS — Z91041 Radiographic dye allergy status: Secondary | ICD-10-CM

## 2019-10-04 DIAGNOSIS — I214 Non-ST elevation (NSTEMI) myocardial infarction: Secondary | ICD-10-CM | POA: Diagnosis not present

## 2019-10-04 DIAGNOSIS — R072 Precordial pain: Secondary | ICD-10-CM

## 2019-10-04 DIAGNOSIS — R2681 Unsteadiness on feet: Secondary | ICD-10-CM

## 2019-10-04 DIAGNOSIS — N189 Chronic kidney disease, unspecified: Secondary | ICD-10-CM

## 2019-10-04 DIAGNOSIS — R600 Localized edema: Secondary | ICD-10-CM

## 2019-10-04 DIAGNOSIS — R778 Other specified abnormalities of plasma proteins: Secondary | ICD-10-CM

## 2019-10-04 DIAGNOSIS — Z7189 Other specified counseling: Secondary | ICD-10-CM | POA: Diagnosis not present

## 2019-10-04 DIAGNOSIS — J90 Pleural effusion, not elsewhere classified: Secondary | ICD-10-CM | POA: Diagnosis not present

## 2019-10-04 DIAGNOSIS — I10 Essential (primary) hypertension: Secondary | ICD-10-CM | POA: Diagnosis present

## 2019-10-04 DIAGNOSIS — I4891 Unspecified atrial fibrillation: Secondary | ICD-10-CM

## 2019-10-04 DIAGNOSIS — N179 Acute kidney failure, unspecified: Secondary | ICD-10-CM

## 2019-10-04 DIAGNOSIS — I499 Cardiac arrhythmia, unspecified: Secondary | ICD-10-CM | POA: Diagnosis not present

## 2019-10-04 DIAGNOSIS — R0902 Hypoxemia: Secondary | ICD-10-CM

## 2019-10-04 DIAGNOSIS — H409 Unspecified glaucoma: Secondary | ICD-10-CM | POA: Diagnosis not present

## 2019-10-04 DIAGNOSIS — Z89512 Acquired absence of left leg below knee: Secondary | ICD-10-CM

## 2019-10-04 DIAGNOSIS — M6249 Contracture of muscle, multiple sites: Secondary | ICD-10-CM

## 2019-10-04 DIAGNOSIS — Z7982 Long term (current) use of aspirin: Secondary | ICD-10-CM

## 2019-10-04 DIAGNOSIS — Z7901 Long term (current) use of anticoagulants: Secondary | ICD-10-CM

## 2019-10-04 DIAGNOSIS — Z515 Encounter for palliative care: Secondary | ICD-10-CM | POA: Diagnosis not present

## 2019-10-04 DIAGNOSIS — M25661 Stiffness of right knee, not elsewhere classified: Secondary | ICD-10-CM

## 2019-10-04 DIAGNOSIS — E1151 Type 2 diabetes mellitus with diabetic peripheral angiopathy without gangrene: Secondary | ICD-10-CM | POA: Diagnosis not present

## 2019-10-04 DIAGNOSIS — Z743 Need for continuous supervision: Secondary | ICD-10-CM | POA: Diagnosis not present

## 2019-10-04 DIAGNOSIS — Z9049 Acquired absence of other specified parts of digestive tract: Secondary | ICD-10-CM

## 2019-10-04 DIAGNOSIS — J9601 Acute respiratory failure with hypoxia: Secondary | ICD-10-CM | POA: Diagnosis not present

## 2019-10-04 DIAGNOSIS — I130 Hypertensive heart and chronic kidney disease with heart failure and stage 1 through stage 4 chronic kidney disease, or unspecified chronic kidney disease: Secondary | ICD-10-CM | POA: Diagnosis not present

## 2019-10-04 DIAGNOSIS — E1122 Type 2 diabetes mellitus with diabetic chronic kidney disease: Secondary | ICD-10-CM | POA: Diagnosis present

## 2019-10-04 DIAGNOSIS — M6281 Muscle weakness (generalized): Secondary | ICD-10-CM

## 2019-10-04 DIAGNOSIS — Z79899 Other long term (current) drug therapy: Secondary | ICD-10-CM

## 2019-10-04 DIAGNOSIS — G9341 Metabolic encephalopathy: Secondary | ICD-10-CM | POA: Diagnosis present

## 2019-10-04 DIAGNOSIS — N182 Chronic kidney disease, stage 2 (mild): Secondary | ICD-10-CM | POA: Diagnosis not present

## 2019-10-04 DIAGNOSIS — Z8249 Family history of ischemic heart disease and other diseases of the circulatory system: Secondary | ICD-10-CM

## 2019-10-04 DIAGNOSIS — D649 Anemia, unspecified: Secondary | ICD-10-CM | POA: Diagnosis present

## 2019-10-04 DIAGNOSIS — H9190 Unspecified hearing loss, unspecified ear: Secondary | ICD-10-CM | POA: Diagnosis present

## 2019-10-04 HISTORY — DX: Cardiac arrhythmia, unspecified: I49.9

## 2019-10-04 HISTORY — DX: Heart failure, unspecified: I50.9

## 2019-10-04 HISTORY — DX: Hypothyroidism, unspecified: E03.9

## 2019-10-04 HISTORY — DX: Chronic kidney disease, unspecified: N18.9

## 2019-10-04 LAB — COMPREHENSIVE METABOLIC PANEL
ALT: 17 U/L (ref 0–44)
AST: 26 U/L (ref 15–41)
Albumin: 3.2 g/dL — ABNORMAL LOW (ref 3.5–5.0)
Alkaline Phosphatase: 61 U/L (ref 38–126)
Anion gap: 13 (ref 5–15)
BUN: 39 mg/dL — ABNORMAL HIGH (ref 8–23)
CO2: 26 mmol/L (ref 22–32)
Calcium: 9 mg/dL (ref 8.9–10.3)
Chloride: 94 mmol/L — ABNORMAL LOW (ref 98–111)
Creatinine, Ser: 2.72 mg/dL — ABNORMAL HIGH (ref 0.61–1.24)
GFR calc Af Amer: 23 mL/min — ABNORMAL LOW (ref >60)
GFR calc non Af Amer: 20 mL/min — ABNORMAL LOW (ref >60)
Glucose, Bld: 229 mg/dL — ABNORMAL HIGH (ref 70–99)
Potassium: 3 mmol/L — ABNORMAL LOW (ref 3.5–5.1)
Sodium: 133 mmol/L — ABNORMAL LOW (ref 135–145)
Total Bilirubin: 0.6 mg/dL (ref 0.3–1.2)
Total Protein: 7 g/dL (ref 6.5–8.1)

## 2019-10-04 LAB — TROPONIN I (HIGH SENSITIVITY)
Troponin I (High Sensitivity): 272 ng/L (ref <18)
Troponin I (High Sensitivity): 572 ng/L (ref <18)

## 2019-10-04 LAB — CBC
HCT: 38.1 % — ABNORMAL LOW (ref 39.0–52.0)
Hemoglobin: 12.7 g/dL — ABNORMAL LOW (ref 13.0–17.0)
MCH: 31.4 pg (ref 26.0–34.0)
MCHC: 33.3 g/dL (ref 30.0–36.0)
MCV: 94.1 fL (ref 80.0–100.0)
Platelets: 222 10*3/uL (ref 150–400)
RBC: 4.05 MIL/uL — ABNORMAL LOW (ref 4.22–5.81)
RDW: 13.9 % (ref 11.5–15.5)
WBC: 10.9 10*3/uL — ABNORMAL HIGH (ref 4.0–10.5)
nRBC: 0 % (ref 0.0–0.2)

## 2019-10-04 LAB — MAGNESIUM: Magnesium: 2.5 mg/dL — ABNORMAL HIGH (ref 1.7–2.4)

## 2019-10-04 MED ORDER — POTASSIUM CHLORIDE CRYS ER 20 MEQ PO TBCR
40.0000 meq | EXTENDED_RELEASE_TABLET | Freq: Once | ORAL | Status: AC
  Administered 2019-10-04: 40 meq via ORAL
  Filled 2019-10-04: qty 2

## 2019-10-04 MED ORDER — ACETAMINOPHEN 325 MG PO TABS
650.0000 mg | ORAL_TABLET | Freq: Once | ORAL | Status: AC
  Administered 2019-10-04: 650 mg via ORAL
  Filled 2019-10-04: qty 2

## 2019-10-04 MED ORDER — POTASSIUM CHLORIDE CRYS ER 10 MEQ PO TBCR
10.0000 meq | EXTENDED_RELEASE_TABLET | Freq: Every day | ORAL | Status: DC
  Administered 2019-10-05 – 2019-10-06 (×3): 10 meq via ORAL
  Filled 2019-10-04 (×4): qty 1

## 2019-10-04 MED ORDER — ASPIRIN EC 81 MG PO TBEC
81.0000 mg | DELAYED_RELEASE_TABLET | Freq: Every day | ORAL | Status: DC
  Administered 2019-10-04 – 2019-10-06 (×3): 81 mg via ORAL
  Filled 2019-10-04 (×3): qty 1

## 2019-10-04 MED ORDER — CARVEDILOL 3.125 MG PO TABS: 3.1250 mg | ORAL_TABLET | Freq: Two times a day (BID) | ORAL | Status: DC

## 2019-10-04 MED ORDER — ALUM & MAG HYDROXIDE-SIMETH 200-200-20 MG/5ML PO SUSP
30.0000 mL | Freq: Once | ORAL | Status: AC
  Administered 2019-10-04: 30 mL via ORAL
  Filled 2019-10-04: qty 30

## 2019-10-04 MED ORDER — FAMOTIDINE 20 MG PO TABS
20.0000 mg | ORAL_TABLET | Freq: Once | ORAL | Status: AC
  Administered 2019-10-04: 20 mg via ORAL
  Filled 2019-10-04: qty 1

## 2019-10-04 MED ORDER — AMLODIPINE BESYLATE 2.5 MG PO TABS
2.5000 mg | ORAL_TABLET | Freq: Every day | ORAL | Status: DC
  Administered 2019-10-04 – 2019-10-11 (×8): 2.5 mg via ORAL
  Filled 2019-10-04 (×8): qty 1

## 2019-10-04 MED ORDER — HEPARIN BOLUS VIA INFUSION
4000.0000 [IU] | Freq: Once | INTRAVENOUS | Status: DC
  Filled 2019-10-04: qty 4000

## 2019-10-04 MED ORDER — DORZOLAMIDE HCL-TIMOLOL MAL 2-0.5 % OP SOLN
1.0000 [drp] | Freq: Two times a day (BID) | OPHTHALMIC | Status: DC
  Administered 2019-10-05: 1 [drp] via OPHTHALMIC
  Filled 2019-10-04 (×3): qty 10

## 2019-10-04 MED ORDER — ATORVASTATIN CALCIUM 40 MG PO TABS
40.0000 mg | ORAL_TABLET | Freq: Every day | ORAL | Status: DC
  Administered 2019-10-05 – 2019-10-11 (×7): 40 mg via ORAL
  Filled 2019-10-04 (×7): qty 1

## 2019-10-04 MED ORDER — TAMSULOSIN HCL 0.4 MG PO CAPS
0.4000 mg | ORAL_CAPSULE | Freq: Two times a day (BID) | ORAL | Status: DC
  Administered 2019-10-04 – 2019-10-11 (×13): 0.4 mg via ORAL
  Filled 2019-10-04 (×13): qty 1

## 2019-10-04 MED ORDER — SODIUM CHLORIDE 0.9 % IV BOLUS
500.0000 mL | Freq: Once | INTRAVENOUS | Status: AC
  Administered 2019-10-04: 500 mL via INTRAVENOUS

## 2019-10-04 MED ORDER — INSULIN ASPART 100 UNIT/ML ~~LOC~~ SOLN
0.0000 [IU] | Freq: Three times a day (TID) | SUBCUTANEOUS | Status: DC
  Administered 2019-10-05: 2 [IU] via SUBCUTANEOUS
  Administered 2019-10-05: 5 [IU] via SUBCUTANEOUS
  Administered 2019-10-05: 2 [IU] via SUBCUTANEOUS
  Administered 2019-10-06 (×3): 3 [IU] via SUBCUTANEOUS
  Administered 2019-10-07: 2 [IU] via SUBCUTANEOUS
  Administered 2019-10-07 – 2019-10-08 (×5): 3 [IU] via SUBCUTANEOUS
  Administered 2019-10-09 (×2): 2 [IU] via SUBCUTANEOUS
  Administered 2019-10-09: 3 [IU] via SUBCUTANEOUS
  Administered 2019-10-10: 2 [IU] via SUBCUTANEOUS
  Administered 2019-10-10: 3 [IU] via SUBCUTANEOUS
  Administered 2019-10-10 – 2019-10-11 (×2): 2 [IU] via SUBCUTANEOUS

## 2019-10-04 MED ORDER — HEPARIN (PORCINE) 25000 UT/250ML-% IV SOLN: 1150.0000 [IU]/h | INTRAVENOUS | Status: DC

## 2019-10-04 MED ORDER — APIXABAN 2.5 MG PO TABS: 2.5000 mg | ORAL_TABLET | Freq: Two times a day (BID) | ORAL | Status: DC

## 2019-10-04 MED ORDER — LACTATED RINGERS IV SOLN: INTRAVENOUS | Status: DC

## 2019-10-04 MED ORDER — ACETAMINOPHEN 325 MG PO TABS: 650.0000 mg | ORAL_TABLET | ORAL | Status: DC | PRN

## 2019-10-04 MED ORDER — LEVOTHYROXINE SODIUM 75 MCG PO TABS
75.0000 ug | ORAL_TABLET | Freq: Every day | ORAL | Status: DC
  Administered 2019-10-05 – 2019-10-11 (×6): 75 ug via ORAL
  Filled 2019-10-04 (×6): qty 1

## 2019-10-04 MED ORDER — FUROSEMIDE 40 MG PO TABS
40.0000 mg | ORAL_TABLET | Freq: Two times a day (BID) | ORAL | Status: DC
  Filled 2019-10-04: qty 2

## 2019-10-04 MED ORDER — POTASSIUM CHLORIDE CRYS ER 20 MEQ PO TBCR
20.0000 meq | EXTENDED_RELEASE_TABLET | Freq: Once | ORAL | Status: AC
  Administered 2019-10-05: 20 meq via ORAL
  Filled 2019-10-04: qty 1

## 2019-10-04 MED ORDER — NITROGLYCERIN 0.4 MG SL SUBL: 0.4000 mg | SUBLINGUAL_TABLET | SUBLINGUAL | Status: DC | PRN

## 2019-10-04 MED ORDER — FINASTERIDE 5 MG PO TABS
5.0000 mg | ORAL_TABLET | Freq: Every evening | ORAL | Status: DC
  Administered 2019-10-04 – 2019-10-10 (×7): 5 mg via ORAL
  Filled 2019-10-04 (×7): qty 1

## 2019-10-04 MED ORDER — INSULIN ASPART 100 UNIT/ML ~~LOC~~ SOLN
0.0000 [IU] | Freq: Every day | SUBCUTANEOUS | Status: DC
  Administered 2019-10-05 – 2019-10-07 (×3): 2 [IU] via SUBCUTANEOUS
  Administered 2019-10-08 – 2019-10-09 (×2): 3 [IU] via SUBCUTANEOUS
  Administered 2019-10-10: 2 [IU] via SUBCUTANEOUS

## 2019-10-04 MED ORDER — HEPARIN (PORCINE) 25000 UT/250ML-% IV SOLN
1150.0000 [IU]/h | INTRAVENOUS | Status: DC
  Administered 2019-10-05: 1150 [IU]/h via INTRAVENOUS
  Filled 2019-10-04: qty 250

## 2019-10-04 NOTE — Therapy (Signed)
[facility] Physical Therapy [address] [place], [place], [address] Phone: [phone]   Fax:  [phone]  Physical Therapy Treatment  Patient Details  Name: [name] MRN: [id number] Date of Birth: [birth year] Referring Provider (PT): [name], MD   Encounter Date: 10/04/2019   PT End of Session - 10/04/19 1242    Visit Number 55    Number of Visits 85    Date for PT Re-Evaluation 12/17/19    Authorization Type [employer] Medicare    Authorization Time Period $30 co-pay    Progress Note Due on Visit 35    PT Start Time 1145    PT Stop Time 1227    PT Time Calculation (min) 42 min    Equipment Utilized During Treatment Gait belt    Activity Tolerance Patient tolerated treatment well    Behavior During Therapy WFL for tasks assessed/performed           Past Medical History:  Diagnosis Date   Arthritis    Borderline diabetic    Diabetes mellitus without complication ([place])    Glaucoma    Hyperlipidemia    Hypertension    PVD (peripheral vascular disease) ([place])    Sleep apnea    Cpap ordered but doesnt use   Urinary retention due to benign prostatic hyperplasia 12/28/2018    Past Surgical History:  Procedure Laterality Date   ABDOMINAL AORTOGRAM W/LOWER EXTREMITY N/A 07/27/2018   Procedure: ABDOMINAL AORTOGRAM W/LOWER EXTREMITY;  Surgeon: Lorretta Harp, MD;  Location: [place];  Service: Cardiovascular;  Laterality: N/A;   AMPUTATION Right 08/28/2018   Procedure: RIGHT BELOW KNEE AMPUTATION;  Surgeon: [name], MD;  Location: [place];  Service: Orthopedics;  Laterality: Right;   AMPUTATION Left 12/02/2018   Procedure: LEFT TRANSMETATARSAL AMPUTATION AND NEGATIVE PRESSURE WOUND VAC PLACEMENT;  Surgeon: [name], MD;  Location: [place];  Service: Orthopedics;  Laterality: Left;   AMPUTATION Left 12/18/2018   Procedure: LEFT BELOW KNEE AMPUTATION;  Surgeon: [name], MD;  Location: [facility];  Service:  Orthopedics;  Laterality: Left;   APPENDECTOMY  1943   BELOW KNEE LEG AMPUTATION Left 12/18/2018   CATARACT EXTRACTION  2012   x2   COLONOSCOPY N/A 11/01/2014   Procedure: COLONOSCOPY;  Surgeon: Aviva Signs, MD;  [facility];  Service: Gastroenterology;  Laterality: N/A;   ESOPHAGOGASTRODUODENOSCOPY N/A 11/01/2014   Procedure: ESOPHAGOGASTRODUODENOSCOPY (EGD);  Surgeon: Aviva Signs, MD;  [facility];  Service: Gastroenterology;  Laterality: N/A;   HERNIA REPAIR Left    INGUINAL HERNIA REPAIR Right 03/01/2013   Procedure: RIGHT INGUINAL HERNIORRHAPHY;  Surgeon: Jamesetta So, MD;  Location: [facility];  Service: General;  Laterality: Right;   INSERTION OF MESH Right 03/01/2013   Procedure: INSERTION OF MESH;  Surgeon: Jamesetta So, MD;  Location: [facility];  Service: General;  Laterality: Right;   LOWER EXTREMITY ANGIOGRAPHY Right 08/25/2018   Procedure: LOWER EXTREMITY ANGIOGRAPHY;  Surgeon: Wellington Hampshire, MD;  Location: [facility];  Service: Cardiovascular;  Laterality: Right;   LOWER EXTREMITY ANGIOGRAPHY N/A 11/25/2018   Procedure: LOWER EXTREMITY ANGIOGRAPHY;  Surgeon: Wellington Hampshire, MD;  Location: [facility];  Service: Cardiovascular;  Laterality: N/A;   NM MYOCAR PERF WALL MOTION  06/05/2010   no significant ischemia   PERIPHERAL VASCULAR ATHERECTOMY Right 08/03/2018   Procedure: PERIPHERAL VASCULAR ATHERECTOMY;  Surgeon: Lorretta Harp, MD;  Location: [facility];  Service:  Cardiovascular;  Laterality: Right;   PERIPHERAL VASCULAR ATHERECTOMY Left 11/23/2018   Procedure: PERIPHERAL VASCULAR ATHERECTOMY;  Surgeon: Lorretta Harp, MD;  Location: [facility];  Service: Cardiovascular;  Laterality: Left;  sfa with dc balloon   PERIPHERAL VASCULAR BALLOON ANGIOPLASTY Left 11/25/2018   Procedure: PERIPHERAL VASCULAR BALLOON ANGIOPLASTY;  Surgeon: Wellington Hampshire, MD;  Location: [facility];  Service:  Cardiovascular;  Laterality: Left;  popliteal   PERIPHERAL VASCULAR INTERVENTION Left 11/25/2018   Procedure: PERIPHERAL VASCULAR INTERVENTION;  Surgeon: Wellington Hampshire, MD;  Location: [facility];  Service: Cardiovascular;  Laterality: Left;  tibial peroneal trunk and peroneal stents    stent  06/14/2010   left leg   STUMP REVISION Left 01/20/2019   Procedure: REVISION LEFT BELOW KNEE AMPUTATION;  Surgeon: [name], MD;  Location: [facility];  Service: Orthopedics;  Laterality: Left;   US ECHOCARDIOGRAPHY  01/21/2006   moderate mitral annular ca+, mild MR, AOV moderately sclerotic.    Vitals:   10/04/19 1145  Weight: 221 lb 11.2 oz (100.6 kg)                        [facility] Adult PT Treatment/Exercise - 10/04/19 1145      Transfers   Transfers Sit to Stand;Stand to [employer] Pivot Transfers    Sit to Stand 5: Supervision;4: Min assist;With upper extremity assist;With armrests;From chair/3-in-1;Other (comment)   to RW minA feet blocke& sup pulling to sink PT blocking feet   Sit to Stand Details Manual facilitation for weight shifting;Verbal cues for safe use of DME/AE;Verbal cues for technique;Verbal cues for sequencing    Stand to Sit 5: Supervision;4: Min guard;With upper extremity assist;With armrests;To chair/3-in-1;Other (comment)   min guard from RW or supervision from sink   Stand to Sit Details (indicate cue type and reason) Verbal cues for technique;Verbal cues for sequencing;Manual facilitation for weight shifting;Verbal cues for safe use of DME/AE      Ambulation/Gait   Ambulation/Gait Yes    Ambulation/Gait Assistance 3: Mod assist    Ambulation/Gait Assistance Details tactile & verbal cues on upright posture, pelvic wt shift lateral & anterior to stance limb and RW distance control.  Increased issues with left knee instability in stance today probably from prosthetic changes and working muscles in new ranges.     Ambulation  Distance (Feet) 30 Feet   25' + 90* turn and 20' straight path   Assistive device Prostheses;Rolling walker    Gait Pattern Step-to pattern;Decreased step length - right;Decreased step length - left;Decreased hip/knee flexion - right;Decreased hip/knee flexion - left;Right foot flat;Left foot flat;Right flexed knee in stance;Left flexed knee in stance;Shuffle;Lateral hip instability;Trunk flexed;Poor foot clearance - left;Poor foot clearance - right      Therapeutic Activites    Other Therapeutic Activities standing at sink for 3 minutes with upright trunk, right /left and anterior / lateral weight shifts and mini squats 5 reps.  Standing prior to gait to stretch.      Knee/Hip Exercises: Seated   Other Seated Knee/Hip Exercises terminal knee extension with foot on 10" stool and red theraband directly posteriorly around knee held directly over knee: 10 reps 5 sec hold BLEs.       Prosthetics   Current prosthetic wear tolerance (days/week)  daily    Current prosthetic wear tolerance (#hours/day)  most of awake hours - up to 6hrs without shrinker under liners.  Edema non-pitting    Residual limb condition  no open areas, normal color & temperature,                     PT Short Term Goals - 09/27/19 1629      PT SHORT TERM GOAL #1   Title Patient and family verbalize & demonstrate understanding of updated HEP.  (All updated STGs Target Date: 10/22/2019)    Time 4    Period Weeks    Status On-going    Target Date 10/22/19      PT SHORT TERM GOAL #2   Title Son able to assist sit to stand w/c to RW with PT supervising with no physical assist.    Time 4    Period Weeks    Status On-going    Target Date 10/22/19      PT SHORT TERM GOAL #3   Title Stand -pivot transfer with RW between w/c & chair with armrests with son's assist safely    Time 4    Period Weeks    Status On-going    Target Date 10/22/19      PT SHORT TERM GOAL #4   Title Standing balance with walker  support reaches 2" anterior to RW with minA    Time 4    Period Weeks    Status On-going    Target Date 10/22/19      PT SHORT TERM GOAL #5   Title Patient ambulates 48' with RW & bilateral prostheses and turns 90* to position to sit with modA.    Time 4    Period Weeks    Status On-going    Target Date 10/22/19             PT Long Term Goals - 09/22/19 2100      PT LONG TERM GOAL #1   Title Patient and family verbalize and demonstrate understanding of proper prosthetic care to enable safe utilization of prostheses. (All LTGs Target Date: 12/17/2019)    Time 3    Period Months    Status On-going    Target Date 12/17/19      PT LONG TERM GOAL #2   Title Patient tolerates wear of bilateral prostheses >90% of awake hours without skin issues or limb pain to enable functional potential during his day.    Time 3    Period Months    Status On-going    Target Date 12/17/19      PT LONG TERM GOAL #3   Title Squat pivot or scooting transfer bed to w/c or chair to w/c with prostheses with supervision.    Time 3    Period Months    Status On-going    Target Date 12/17/19      PT LONG TERM GOAL #4   Title Sit to / from stand elevated w/c to walker with prostheses with minA    Time 3    Period Months    Status On-going    Target Date 12/17/19      PT LONG TERM GOAL #5   Title Standing balance with walker support with bilateral prostheses static stance for 2 minutes, scanning environment and reaches 2" anteriorly with supervision.    Time 3    Period Months    Status On-going    Target Date 12/17/19      PT LONG TERM GOAL #6   Title Patient ambulates 25' between 2 chairs including turning 90* to position  to sit with RW & prostheses with minA from family safely.    Time 3    Period Months    Status On-going    Target Date 12/17/19                 Plan - 10/04/19 1242    Clinical Impression Statement PT added terminal knee extension theraband exercise in sitting  to HEP and his son appears to understand to assist at home.  Pt had increased knee instability probably as prosthetic adjustments are using knee muscles in new ranges. It should improve with standing & gait in new ranges.    Personal Factors and Comorbidities Age;Comorbidity 3+;Fitness;Time since onset of injury/illness/exacerbation    Comorbidities Bil TTAs, DM2, PAD, HTN, CHF, A-Fib, TIA, CKDst3,    Examination-Activity Limitations Locomotion Level;Sit;Stand;Toileting;Transfers    Examination-Participation Restrictions Community Activity    Stability/Clinical Decision Making Evolving/Moderate complexity    Rehab Potential Good    PT Frequency 2x / week    PT Duration 12 weeks   3 months   PT Treatment/Interventions ADLs/Self Care Home Management;DME Instruction;Gait training;Stair training;Functional mobility training;Therapeutic activities;Therapeutic exercise;Balance training;Neuromuscular re-education;Patient/family education;Prosthetic Training;Manual techniques;Passive range of motion;Vestibular    PT Next Visit Plan Work towards STGs, Prosthetic gait, Therapuetic exercise    Consulted and Agree with Plan of Care Patient;Family member/caregiver    Family Member Consulted son, Pablo Stauffer           Patient will benefit from skilled therapeutic intervention in order to improve the following deficits and impairments:  Abnormal gait, Cardiopulmonary status limiting activity, Decreased activity tolerance, Decreased balance, Decreased endurance, Decreased knowledge of use of DME, Decreased mobility, Decreased range of motion, Decreased scar mobility, Decreased strength, Increased edema, Impaired flexibility, Impaired UE functional use, Postural dysfunction, Prosthetic Dependency, Pain  Visit Diagnosis: Other abnormalities of gait and mobility  Unsteadiness on feet  Abnormal posture  Stiffness of right knee, not elsewhere classified  Stiffness of left knee, not elsewhere  classified  Contracture of muscle, multiple sites  Localized edema  Muscle weakness (generalized)     Problem List Patient Active Problem List   Diagnosis Date Noted   Weakness generalized 04/05/2019   S/P BKA (below knee amputation) unilateral, left ([place]) 02/15/2019   Pressure injury of skin 02/03/2019   TIA (transient ischemic attack) 02/03/2019   Transient speech disturbance 02/02/2019   Chronic indwelling Foley catheter 01/22/2019   Postoperative wound infection 01/20/2019   Normocytic anemia 01/18/2019   Cellulitis 01/18/2019   Infection of deep incisional surgical site after procedure    Abnormal urinalysis    Labile blood glucose    Urinary retention    S/P bilateral BKA (below knee amputation) ([place])    Acute blood loss anemia    Urinary retention due to benign prostatic hyperplasia 12/28/2018   Unilateral complete BKA, left, initial encounter ([place]) 12/25/2018   Dehiscence of amputation stump ([place])    History of transmetatarsal amputation of left foot ([place]) 12/09/2018   Critical limb ischemia with history of revascularization of same extremity    Gangrene of left foot ([facility])    Diabetic ulcer of toe of left foot associated with type 2 diabetes mellitus ([place])    Cellulitis of left lower extremity    Toe infection 11/18/2018   Peripheral edema    Acute on chronic diastolic CHF (congestive heart failure) ([place]) 11/09/2018   S/P BKA (below knee amputation) unilateral, right ([place]) 10/05/2018   Hyponatremia    Essential hypertension  Postoperative pain    Pain of left heel    Unable to maintain weight-bearing    Type 2 diabetes mellitus with diabetic peripheral angiopathy with gangrene ([place])    Anemia due to acute blood loss    Chronic kidney disease, stage 3a    Acquired absence of right leg below knee ([place]) 09/07/2018   Gangrene of right foot ([place])    DNR (do not resuscitate) discussion    Goals of care,  counseling/discussion    Palliative care by specialist    Type 2 diabetes mellitus with vascular disease ([place])    Severe protein-calorie malnutrition ([place])    Ischemic foot 08/20/2018   Critical lower limb ischemia 08/03/2018   Type 2 diabetes mellitus with foot ulcer, without long-term current use of insulin ([facility])    Gastroesophageal reflux disease    Cellulitis of great toe of right foot 05/29/2018   Atrial fibrillation, chronic    Chest pain 07/25/2017   PAF (paroxysmal atrial fibrillation) ([place]) 07/25/2017   BPH (benign prostatic hyperplasia) 07/25/2017   Shortness of breath 05/11/2017   Hypothyroidism 05/11/2017   Chronic diastolic CHF (congestive heart failure) ([place]) 05/11/2017   Moderate aortic stenosis 06/12/2016   RBBB 06/12/2016   Peripheral vascular disease ([place]) 08/04/2012   Essential hypertension, benign 08/04/2012   Hyperlipidemia 08/04/2012   Dizziness 08/04/2012    Jamey Reas PT, DPT 10/04/2019, 12:52 PM  [facility] Physical Therapy [address] [place], [place], [address] Phone: [phone]   Fax:  [phone]  Name: [name] MRN: [id number] Date of Birth: [birth year]

## 2019-10-04 NOTE — ED Notes (Signed)
Date and time results received: 10/04/19    Test: Troponin Critical Value: 272  Name of Provider Notified: Dr. Ashok Cordia   Orders Received? Or Actions Taken?: repeat troponin drawn and primary RN notified

## 2019-10-04 NOTE — Progress Notes (Addendum)
ANTICOAGULATION CONSULT NOTE - Initial Consult  Pharmacy Consult for IV heparin Indication: chest pain/ACS  Allergies  Allergen Reactions  . Iodinated Diagnostic Agents     Other reaction(s): Fever  . Dye Fdc Red [Red Dye] Hives    Tolerated red Docusate, Niferex    Patient Measurements: Height: 5\' 9"  (175.3 cm) Weight: 100.4 kg (221 lb 6.4 oz) IBW/kg (Calculated) : 70.7 Heparin Dosing Weight: 92kg  Vital Signs: Temp: 97.6 F (36.4 C) (09/20 1702) Temp Source: Oral (09/20 1702) BP: 149/97 (09/20 1939) Pulse Rate: 99 (09/20 1939)  Labs: Recent Labs    10/04/19 1800 10/04/19 1910  HGB 12.7*  --   HCT 38.1*  --   PLT 222  --   CREATININE 2.72*  --   TROPONINIHS 272* 572*    Estimated Creatinine Clearance: 21.1 mL/min (A) (by C-G formula based on SCr of 2.72 mg/dL (H)).   Medical History: Past Medical History:  Diagnosis Date  . Arthritis   . Borderline diabetic   . CHF (congestive heart failure) ([place])   . Chronic kidney disease   . Diabetes mellitus without complication ([place])   . Dysrhythmia   . Glaucoma   . Hyperlipidemia   . Hypertension   . Hypothyroidism   . PVD (peripheral vascular disease) ([place])   . Sleep apnea    Cpap ordered but doesnt use  . Urinary retention due to benign prostatic hyperplasia 12/28/2018    Medications:  Infusions:  . heparin      Assessment: 89yoM presenting with substernal chest pressure/tightness not relieved by rest. He developed shoulder and upper arm pain so he was brought to ED for evaluation. Denies SOB, N/V or diaphoresis. PMH s/f atrial fibrillation on apixaban, CHF, HTN and PVD s/p bilateral AKA. Reports compliance with medications.   Troponin elevated from 272 to 572 since admission. EKG without ST elevation. Hgb 12.7, PLT 200s, no s/sx bleeding. Pharmacy consulted to dose IV heparin.  Goal of Therapy:  Heparin level 0.3-0.7 units/ml aPTT 66-102 seconds Monitor platelets by anticoagulation protocol: Yes    Plan:  Start heparin infusion at 1,150 units/hr Monitor aPTT in 6 hours and HL daily until correlating, then discontinue aPTT  Continue to monitor H&H and platelets  Follow cardiology workup and plans  Mercy Riding, PharmD PGY1 Acute Care Pharmacy Resident Please refer to [facility] for unit-specific pharmacist

## 2019-10-04 NOTE — Consult Note (Signed)
Cardiology Admission History and Physical:   Patient ID: [name] MRN: [id number]; DOB: November 27, 1927   Admission date: 10/04/2019  Primary Care Provider: [employer], [place] Cardiologist: Pixie Casino, MD  [place] Electrophysiologist:  None   Chief Complaint:  Chest pain  Patient Profile:   [name] is a 86 y.o. male with HTN, HLD, PAD s/p bilateral amputation, diastolic HF, moderate - severe AS, aF on eliquis, who presents with chest pain.   History of Present Illness:   [name] developed "indigestion" during the car ride home from rehab earlier today. His son tried to given him tums, H2 blocker without relief. Pain began to radiate into his L arm and L shoulder. Pain was 8/10 at its worst. Not associated with diaphoresis, nausea, or lightheadedness. Family took his BP and noted pulse to be elevated to the low 100s. Family reached out to Dr. Lysbeth Penner office who recommended coming to the ER for evaluation.   Initial EKG on arrival with RBBB and ST changes concerning for myocardial ischemia predominantly in lateral leads, but with some elevation in AVR concerning for more global process. Was in atrial fibrillation. Labs with hsTn 272-> 572, also notable for Cr 2.7 from baseline of 1.3.   At the time of my evaluation, patient chest pain free and comfortable. Denied SOB or cough. Per son has been taking lasix daily with 3+L UOP that has been unchanged (has indwelling foley due to BPH). Urine mostly light yellow in color. Reports normal PO intake.  Past Medical History:  Diagnosis Date  . Arthritis   . Borderline diabetic   . CHF (congestive heart failure) ([place])   . Chronic kidney disease   . Diabetes mellitus without complication ([place])   . Dysrhythmia   . Glaucoma   . Hyperlipidemia   . Hypertension   . Hypothyroidism   . PVD (peripheral vascular disease) ([place])   . Sleep apnea    Cpap ordered but doesnt use  . Urinary retention due to benign  prostatic hyperplasia 12/28/2018    Past Surgical History:  Procedure Laterality Date  . ABDOMINAL AORTOGRAM W/LOWER EXTREMITY N/A 07/27/2018   Procedure: ABDOMINAL AORTOGRAM W/LOWER EXTREMITY;  Surgeon: Lorretta Harp, MD;  Location: [facility];  Service: Cardiovascular;  Laterality: N/A;  . AMPUTATION Right 08/28/2018   Procedure: RIGHT BELOW KNEE AMPUTATION;  Surgeon: [name], MD;  Location: [facility];  Service: Orthopedics;  Laterality: Right;  . AMPUTATION Left 12/02/2018   Procedure: LEFT TRANSMETATARSAL AMPUTATION AND NEGATIVE PRESSURE WOUND VAC PLACEMENT;  Surgeon: [name], MD;  Location: [facility];  Service: Orthopedics;  Laterality: Left;  . AMPUTATION Left 12/18/2018   Procedure: LEFT BELOW KNEE AMPUTATION;  Surgeon: [name], MD;  Location: [facility];  Service: Orthopedics;  Laterality: Left;  . APPENDECTOMY  1943  . BELOW KNEE LEG AMPUTATION Left 12/18/2018  . CATARACT EXTRACTION  2012   x2  . COLONOSCOPY N/A 11/01/2014   Procedure: COLONOSCOPY;  Surgeon: Aviva Signs, MD;  [facility];  Service: Gastroenterology;  Laterality: N/A;  . ESOPHAGOGASTRODUODENOSCOPY N/A 11/01/2014   Procedure: ESOPHAGOGASTRODUODENOSCOPY (EGD);  Surgeon: Aviva Signs, MD;  [facility];  Service: Gastroenterology;  Laterality: N/A;  . HERNIA REPAIR Left   . INGUINAL HERNIA REPAIR Right 03/01/2013   Procedure: RIGHT INGUINAL HERNIORRHAPHY;  Surgeon: Jamesetta So, MD;  Location: [facility];  Service: General;  Laterality: Right;  . INSERTION OF MESH Right 03/01/2013  Procedure: INSERTION OF MESH;  Surgeon: Jamesetta So, MD;  Location[facility];  Service: General;  Laterality: Right;  . LOWER EXTREMITY ANGIOGRAPHY Right 08/25/2018   Procedure: LOWER EXTREMITY ANGIOGRAPHY;  Surgeon: Wellington Hampshire, MD;  Location: [facility];  Service: Cardiovascular;  Laterality: Right;  . LOWER EXTREMITY ANGIOGRAPHY N/A 11/25/2018   Procedure: LOWER EXTREMITY ANGIOGRAPHY;   Surgeon: Wellington Hampshire, MD;  Location: [facility];  Service: Cardiovascular;  Laterality: N/A;  . NM MYOCAR PERF WALL MOTION  06/05/2010   no significant ischemia  . PERIPHERAL VASCULAR ATHERECTOMY Right 08/03/2018   Procedure: PERIPHERAL VASCULAR ATHERECTOMY;  Surgeon: Lorretta Harp, MD;  Location: [facility];  Service: Cardiovascular;  Laterality: Right;  . PERIPHERAL VASCULAR ATHERECTOMY Left 11/23/2018   Procedure: PERIPHERAL VASCULAR ATHERECTOMY;  Surgeon: Lorretta Harp, MD;  Location: [facility];  Service: Cardiovascular;  Laterality: Left;  sfa with dc balloon  . PERIPHERAL VASCULAR BALLOON ANGIOPLASTY Left 11/25/2018   Procedure: PERIPHERAL VASCULAR BALLOON ANGIOPLASTY;  Surgeon: Wellington Hampshire, MD;  Location: [facility];  Service: Cardiovascular;  Laterality: Left;  popliteal  . PERIPHERAL VASCULAR INTERVENTION Left 11/25/2018   Procedure: PERIPHERAL VASCULAR INTERVENTION;  Surgeon: Wellington Hampshire, MD;  Location: [facility];  Service: Cardiovascular;  Laterality: Left;  tibial peroneal trunk and peroneal stents   . stent  06/14/2010   left leg  . STUMP REVISION Left 01/20/2019   Procedure: REVISION LEFT BELOW KNEE AMPUTATION;  Surgeon: [name], MD;  Location: [facility];  Service: Orthopedics;  Laterality: Left;  . US ECHOCARDIOGRAPHY  01/21/2006   moderate mitral annular ca+, mild MR, AOV moderately sclerotic.     Medications Prior to Admission: Prior to Admission medications   Medication Sig Start Date End Date Taking? Authorizing Provider  acetaminophen (TYLENOL) 500 MG tablet Take 500 mg by mouth every 6 (six) hours as needed for headache (pain).   Yes [provider]  amLODipine (NORVASC) 2.5 MG tablet Take 5 mg by mouth every evening.  03/31/19  Yes [provider]  apixaban (ELIQUIS) 5 MG TABS tablet Take 2.5 mg by mouth 2 (two) times daily.    Yes [provider]  Ascorbic Acid (VITAMIN C) 1000  MG tablet Take 1,000 mg by mouth daily.    Yes [provider]  aspirin EC 81 MG tablet Take 81 mg by mouth daily.   Yes [provider]  atorvastatin (LIPITOR) 40 MG tablet TAKE 1 TABLET BY MOUTH EVERY DAY Patient taking differently: Take 40 mg by mouth every evening.  04/06/19  Yes Hilty, Nadean Corwin, MD  Cholecalciferol (VITAMIN D-3) 125 MCG (5000 UT) TABS Take 5,000 Units by mouth every evening.   Yes [provider]  clopidogrel (PLAVIX) 75 MG tablet Take 1 tablet (75 mg total) by mouth daily with breakfast. 12/10/18  Yes Gonfa, Taye T, MD  famotidine (PEPCID) 20 MG tablet Take 1 tablet (20 mg total) by mouth daily. 11/12/18  Yes Johnson, Clanford L, MD  ferrous sulfate 325 (65 FE) MG tablet Take 325 mg by mouth 2 (two) times daily with a meal.   Yes [provider]  finasteride (PROSCAR) 5 MG tablet Take 1 tablet (5 mg total) by mouth every evening. 09/22/18  Yes Angiulli, Lavon Paganini, PA-C  furosemide (LASIX) 40 MG tablet Take 20-40 mg by mouth See admin instructions. Take one tablet (40 mg) by mouth every morning and 1/2 tablet (  20 mg) every evening   Yes [provider]  glipiZIDE (GLUCOTROL) 5 MG tablet Take 5 mg by mouth 2 (two) times daily.    Yes [provider]  latanoprost (XALATAN) 0.005 % ophthalmic solution Place 1 drop into both eyes at bedtime. 09/30/19  Yes [provider]  levothyroxine (SYNTHROID) 75 MCG tablet Take 75 mcg by mouth daily before breakfast.  02/12/19  Yes [provider]  metoprolol tartrate (LOPRESSOR) 25 MG tablet Take 12.5 mg by mouth daily as needed (palpitations/heart racing).   Yes [provider]  Misc Natural Products (GLUCOSAMINE CHOND MSM FORMULA PO) Take 1 tablet by mouth 2 (two) times daily.   Yes [provider]  polyethylene glycol (MIRALAX) 17 g packet Take 17 g by mouth daily as needed for moderate constipation. 12/09/18  Yes Mercy Riding, MD  Potassium Gluconate 550  (90 K) MG TABS Take 90 mg by mouth 2 (two) times daily.   Yes [provider]  pyridoxine (B-6) 100 MG tablet Take 100 mg by mouth daily.   Yes [provider]  traMADol (ULTRAM) 50 MG tablet Take 1 tablet (50 mg total) by mouth every 6 (six) hours as needed. Patient taking differently: Take 50 mg by mouth every 6 (six) hours as needed (pain).  02/15/19  Yes Lovorn, Megan, MD  vitamin E 400 UNIT capsule Take 400 Units by mouth daily.   Yes [provider]  diclofenac Sodium (VOLTAREN) 1 % GEL Apply 2 g topically 4 (four) times daily. Patient not taking: Reported on 10/04/2019 01/05/19   Love, Ivan Anchors, PA-C  dorzolamide-timolol (COSOPT) 22.3-6.8 MG/ML ophthalmic solution Place 1 drop into both eyes 2 (two) times daily. Patient not taking: Reported on 10/04/2019    [provider]  methocarbamol (ROBAXIN) 500 MG tablet Take 1 tablet (500 mg total) by mouth every 6 (six) hours as needed for muscle spasms. Patient not taking: Reported on 10/04/2019 01/22/19   Debbe Odea, MD  [facility] ULTRA test strip 1 each 2 (two) times daily. 04/15/19   [provider]  potassium chloride (KLOR-CON) 10 MEQ tablet Take 1 tablet (10 mEq total) by mouth daily. Patient not taking: Reported on 10/04/2019 11/12/18   Murlean Iba, MD  tamsulosin (FLOMAX) 0.4 MG CAPS capsule Take 1 capsule (0.4 mg total) by mouth 2 (two) times daily. Patient not taking: Reported on 10/04/2019 09/22/18   Cathlyn Parsons, PA-C     Allergies:    Allergies  Allergen Reactions  . Iodinated Diagnostic Agents Other (See Comments)    caused Fever  . Dye Fdc Red [Red Dye] Hives    Tolerated red Docusate, Niferex    Social History:   Social History   Socioeconomic History  . Marital status: Widowed    Spouse name: Not on file  . Number of children: 3  . Years of education: 80 th  . Highest education level: Not on file  Occupational History  . Occupation: Retired Psychologist, sport and exercise  Tobacco Use  .  Smoking status: Former Smoker    Quit date: 01/13/1965    Years since quitting: 54.7  . Smokeless tobacco: Former Systems developer    Types: Secondary school teacher  . Vaping Use: Never used  Substance and Sexual Activity  . Alcohol use: No  . Drug use: No  . Sexual activity: Not Currently  Other Topics Concern  . Not on file  Social History Narrative    Has 2 children! Very supportive.  Social Determinants of Health   Financial Resource Strain: Low Risk   . Difficulty of Paying Living Expenses: Not hard at all  Food Insecurity: No Food Insecurity  . Worried About Charity fundraiser in the Last Year: Never true  . Ran Out of Food in the Last Year: Never true  Transportation Needs: No Transportation Needs  . Lack of Transportation (Medical): No  . Lack of Transportation (Non-Medical): No  Physical Activity: Inactive  . Days of Exercise per Week: 0 days  . Minutes of Exercise per Session: 0 min  Stress: Stress Concern Present  . Feeling of Stress : To some extent  Social Connections: Socially Isolated  . Frequency of Communication with Friends and Family: More than three times a week  . Frequency of Social Gatherings with Friends and Family: More than three times a week  . Attends Religious Services: Never  . Active Member of Clubs or Organizations: No  . Attends Archivist Meetings: Never  . Marital Status: Widowed  Intimate Partner Violence: Not At Risk  . Fear of Current or Ex-Partner: No  . Emotionally Abused: No  . Physically Abused: No  . Sexually Abused: No    Family History:  The patient's family history includes Cancer in his mother; Heart attack in his brother, brother, and father; Stroke in his father.    ROS:  Please see the history of present illness.  All other ROS reviewed and negative.     Physical Exam/Data:   Vitals:   10/04/19 2243 10/04/19 2246 10/04/19 2300 10/04/19 2315  BP: (!) 139/97     Pulse: 64 82 64 94  Resp: 20 13 18 15   Temp:       TempSrc:      SpO2: 92% 94% 94% 95%  Weight:      Height:       No intake or output data in the 24 hours ending 10/04/19 2352 Last 3 Weights 10/04/2019 10/04/2019 09/27/2019  Weight (lbs) 221 lb 6.4 oz 221 lb 11.2 oz 219 lb 11.2 oz  Weight (kg) 100.426 kg 100.562 kg 99.655 kg     Body mass index is 32.7 kg/m.  General:  Elderly man lying in bed; NAD.  HEENT: normal Neck: EJ @ 5-6 mmHg.  Cardiac:  Irregularly irregular.  Lungs:  clear to auscultation bilaterally, no wheezing, rhonchi or rales  Abd: soft, nontender, no hepatomegaly  Ext: s/p bilateral LE amputation. Foley in situ.  Skin: warm and dry   EKG:  The ECG that was done demonstrates AF w/ RBBB. upsloping ST depressions in anterolateral leads with elevation in AVR concerning for global ischemia.    Relevant CV Studies: TTE 08/2019: 1. Left ventricular ejection fraction, by estimation, is 55 to 60%. The  left ventricle has normal function. The left ventricle has no regional  wall motion abnormalities. There is moderate left ventricular hypertrophy.  Left ventricular diastolic  parameters are consistent with Grade I diastolic dysfunction (impaired  relaxation). Elevated left atrial pressure.  2. Right ventricular systolic function is normal. The right ventricular  size is normal.  3. The mitral valve is normal in structure. No evidence of mitral valve  regurgitation. No evidence of mitral stenosis.  4. The aortic valve has an indeterminant number of cusps. Aortic valve  regurgitation is not visualized. Moderate to severe aortic valve  stenosis.Aortic valve mean gradient measures 27.4 mmHg. Aortic valve peak  gradient measures 44.6 mmHg. Aortic valve  area, by VTI measures 0.77  cm.   Laboratory Data:  High Sensitivity Troponin:   Recent Labs  Lab 10/04/19 1800 10/04/19 1910  TROPONINIHS 272* 572*      Chemistry Recent Labs  Lab 10/04/19 1800  NA 133*  K 3.0*  CL 94*  CO2 26  GLUCOSE 229*  BUN 39*   CREATININE 2.72*  CALCIUM 9.0  GFRNONAA 20*  GFRAA 23*  ANIONGAP 13    Recent Labs  Lab 10/04/19 1800  PROT 7.0  ALBUMIN 3.2*  AST 26  ALT 17  ALKPHOS 61  BILITOT 0.6   Hematology Recent Labs  Lab 10/04/19 1800  WBC 10.9*  RBC 4.05*  HGB 12.7*  HCT 38.1*  MCV 94.1  MCH 31.4  MCHC 33.3  RDW 13.9  PLT 222   BNPNo results for input(s): BNP, PROBNP in the last 168 hours.  DDimer No results for input(s): DDIMER in the last 168 hours.   Radiology/Studies:  DG Chest Port 1 View  Result Date: 10/04/2019 CLINICAL DATA:  Chest pain since 2 p.m. EXAM: PORTABLE CHEST 1 VIEW COMPARISON:  12/09/2018 FINDINGS: Single frontal view of the chest demonstrates an unremarkable cardiac silhouette. There is small residual left pleural effusion. No airspace disease or pneumothorax. No acute bony abnormalities. IMPRESSION: 1. Small residual left pleural effusion, markedly decreased since prior study. 2. No acute airspace disease. Electronically Signed   By: Randa Ngo M.D.   On: 10/04/2019 17:12     Assessment and Plan:   1. Chest Pain, Rising Troponin -- Patient presents with episode of "indigestion" like SSCP that radiated to L shoulder and L arm. Trop 200s -> 500s. EKG with changes concerning for global ischemia. C/f proximal epicardial coronary disease +/- progression of AS.  -- Not an ideal candidate for cath at this point given AKI. Would continue medical mgmt as long as pain free while we await renal recovery.  -- Would continue to trend troponin to peak; repeat EKG for changes in symptoms.  -- AVOID nitroglycerin given moderate-severe AS.  -- Continue home ASA and plavix.  -- Will heparinize (asked pharmacy to start once safe given renal injury and dose this AM).   2. Atrial Fibrillation -- Monitor on telemetry. If does not convert to SR would query whether rhythm change precipitated increased myocardial supply / demand mismatch in setting of fixed output from AS. May be in  lower output state which could be contributing to AKI assuming no intrinsic pathology identified.  -- Continue anticoagulation.  -- Caution with BB given conduction disease.    3. Aortic Stenosis; Moderate - Severe -- Would be very cautious with fluid balance. Would try to keep net even.  -- Can discuss TAVR candidacy during this admission, but will need to see improvement in AKI first to facilitate imaging. Will also need to elucidate coronary anatomy.  -- AVOID vasodilators.   3. AKI on CKD -- Would consider involvement of urology / neprhology given recetn obstructive symptoms.  -- Check UA -- Would aim for net even fluid balance.   4. HTN -- Ok to continue home amlodipine.   We will continue to follow. Please page with clinical changes.   Signed, Milus Banister, MD  10/04/2019 11:52 PM

## 2019-10-04 NOTE — ED Provider Notes (Addendum)
[name] Provider Note   CSN: [id number] Arrival date & time: 10/04/19  1618     History Chief Complaint  Patient presents with  . Chest Pain    [name] is a 86 y.o. male.  Patient c/o mid to upper chest pain today. Symptoms acute onset around 2 pm today, at rest, non radiating, dull and felt like indigestion. No associated sob, nv or diaphoresis. Denies same pain. Sees cardiology for afib and aortic stenosis, is on anticoag therapy, compliant w home meds. No other recent cp or discomfort. No unusual doe. No pleuritic pain. No leg pain or swelling. bil leg amputee in past. Denies cough or uri symptoms. No fever or chills. No abd pain or nv. EMS did give patient chewable asa pta, and pt is on anticoag therapy.   The history is provided by the patient, the nursing home and a relative.  Chest Pain Associated symptoms: no abdominal pain, no back pain, no cough, no fever, no headache, no nausea, no palpitations, no shortness of breath and no vomiting        Past Medical History:  Diagnosis Date  . Arthritis   . Borderline diabetic   . Diabetes mellitus without complication ([place])   . Glaucoma   . Hyperlipidemia   . Hypertension   . PVD (peripheral vascular disease) ([place])   . Sleep apnea    Cpap ordered but doesnt use  . Urinary retention due to benign prostatic hyperplasia 12/28/2018    Patient Active Problem List   Diagnosis Date Noted  . Weakness generalized 04/05/2019  . S/P BKA (below knee amputation) unilateral, left ([place]) 02/15/2019  . Pressure injury of skin 02/03/2019  . TIA (transient ischemic attack) 02/03/2019  . Transient speech disturbance 02/02/2019  . Chronic indwelling Foley catheter 01/22/2019  . Postoperative wound infection 01/20/2019  . Normocytic anemia 01/18/2019  . Cellulitis 01/18/2019  . Infection of deep incisional surgical site after procedure   . Abnormal urinalysis   . Labile blood glucose   .  Urinary retention   . S/P bilateral BKA (below knee amputation) ([place])   . Acute blood loss anemia   . Urinary retention due to benign prostatic hyperplasia 12/28/2018  . Unilateral complete BKA, left, initial encounter ([place]) 12/25/2018  . Dehiscence of amputation stump ([place])   . History of transmetatarsal amputation of left foot ([place]) 12/09/2018  . Critical limb ischemia with history of revascularization of same extremity   . Gangrene of left foot ([place])   . Diabetic ulcer of toe of left foot associated with type 2 diabetes mellitus ([place])   . Cellulitis of left lower extremity   . Toe infection 11/18/2018  . Peripheral edema   . Acute on chronic diastolic CHF (congestive heart failure) ([place]) 11/09/2018  . S/P BKA (below knee amputation) unilateral, right ([place]) 10/05/2018  . Hyponatremia   . Essential hypertension   . Postoperative pain   . Pain of left heel   . Unable to maintain weight-bearing   . Type 2 diabetes mellitus with diabetic peripheral angiopathy with gangrene ([place])   . Anemia due to acute blood loss   . Chronic kidney disease, stage 3a   . Acquired absence of right leg below knee ([place]) 09/07/2018  . Gangrene of right foot ([place])   . DNR (do not resuscitate) discussion   . Goals of care, counseling/discussion   . Palliative care by specialist   . Type 2 diabetes mellitus with vascular disease (  [place])   . Severe protein-calorie malnutrition ([place])   . Ischemic foot 08/20/2018  . Critical lower limb ischemia 08/03/2018  . Type 2 diabetes mellitus with foot ulcer, without long-term current use of insulin ([place])   . Gastroesophageal reflux disease   . Cellulitis of great toe of right foot 05/29/2018  . Atrial fibrillation, chronic   . Chest pain 07/25/2017  . PAF (paroxysmal atrial fibrillation) ([place]) 07/25/2017  . BPH (benign prostatic hyperplasia) 07/25/2017  . Shortness of breath 05/11/2017  . Hypothyroidism 05/11/2017  . Chronic diastolic CHF (congestive heart failure)  ([place]) 05/11/2017  . Moderate aortic stenosis 06/12/2016  . RBBB 06/12/2016  . Peripheral vascular disease ([place]) 08/04/2012  . Essential hypertension, benign 08/04/2012  . Hyperlipidemia 08/04/2012  . Dizziness 08/04/2012    Past Surgical History:  Procedure Laterality Date  . ABDOMINAL AORTOGRAM W/LOWER EXTREMITY N/A 07/27/2018   Procedure: ABDOMINAL AORTOGRAM W/LOWER EXTREMITY;  Surgeon: Lorretta Harp, MD;  Location: [facility];  Service: Cardiovascular;  Laterality: N/A;  . AMPUTATION Right 08/28/2018   Procedure: RIGHT BELOW KNEE AMPUTATION;  Surgeon: [name], MD;  Location: [facility];  Service: Orthopedics;  Laterality: Right;  . AMPUTATION Left 12/02/2018   Procedure: LEFT TRANSMETATARSAL AMPUTATION AND NEGATIVE PRESSURE WOUND VAC PLACEMENT;  Surgeon: [name], MD;  Location: [facility];  Service: Orthopedics;  Laterality: Left;  . AMPUTATION Left 12/18/2018   Procedure: LEFT BELOW KNEE AMPUTATION;  Surgeon: [name], MD;  Location: [facility];  Service: Orthopedics;  Laterality: Left;  . APPENDECTOMY  1943  . BELOW KNEE LEG AMPUTATION Left 12/18/2018  . CATARACT EXTRACTION  2012   x2  . COLONOSCOPY N/A 11/01/2014   Procedure: COLONOSCOPY;  Surgeon: Aviva Signs, MD;  [facility];  Service: Gastroenterology;  Laterality: N/A;  . ESOPHAGOGASTRODUODENOSCOPY N/A 11/01/2014   Procedure: ESOPHAGOGASTRODUODENOSCOPY (EGD);  Surgeon: Aviva Signs, MD;  [facility];  Service: Gastroenterology;  Laterality: N/A;  . HERNIA REPAIR Left   . INGUINAL HERNIA REPAIR Right 03/01/2013   Procedure: RIGHT INGUINAL HERNIORRHAPHY;  Surgeon: Jamesetta So, MD;  Location: [facility];  Service: General;  Laterality: Right;  . INSERTION OF MESH Right 03/01/2013   Procedure: INSERTION OF MESH;  Surgeon: Jamesetta So, MD;  Location: [facility];  Service: General;  Laterality: Right;  . LOWER EXTREMITY ANGIOGRAPHY Right 08/25/2018   Procedure: LOWER EXTREMITY ANGIOGRAPHY;   Surgeon: Wellington Hampshire, MD;  Location: [facility];  Service: Cardiovascular;  Laterality: Right;  . LOWER EXTREMITY ANGIOGRAPHY N/A 11/25/2018   Procedure: LOWER EXTREMITY ANGIOGRAPHY;  Surgeon: Wellington Hampshire, MD;  Location: [facility];  Service: Cardiovascular;  Laterality: N/A;  . NM MYOCAR PERF WALL MOTION  06/05/2010   no significant ischemia  . PERIPHERAL VASCULAR ATHERECTOMY Right 08/03/2018   Procedure: PERIPHERAL VASCULAR ATHERECTOMY;  Surgeon: Lorretta Harp, MD;  Location: [facility];  Service: Cardiovascular;  Laterality: Right;  . PERIPHERAL VASCULAR ATHERECTOMY Left 11/23/2018   Procedure: PERIPHERAL VASCULAR ATHERECTOMY;  Surgeon: Lorretta Harp, MD;  Location: [facility];  Service: Cardiovascular;  Laterality: Left;  sfa with dc balloon  . PERIPHERAL VASCULAR BALLOON ANGIOPLASTY Left 11/25/2018   Procedure: PERIPHERAL VASCULAR BALLOON ANGIOPLASTY;  Surgeon: Wellington Hampshire, MD;  Location: [facility];  Service: Cardiovascular;  Laterality: Left;  popliteal  . PERIPHERAL VASCULAR INTERVENTION Left 11/25/2018   Procedure: PERIPHERAL VASCULAR INTERVENTION;  Surgeon: Wellington Hampshire, MD;  Location: [facility];  Service: Cardiovascular;  Laterality: Left;  tibial peroneal trunk and peroneal stents   . stent  06/14/2010   left leg  . STUMP REVISION Left 01/20/2019   Procedure: REVISION LEFT BELOW KNEE AMPUTATION;  Surgeon: [name], MD;  Location: [facility];  Service: Orthopedics;  Laterality: Left;  . US ECHOCARDIOGRAPHY  01/21/2006   moderate mitral annular ca+, mild MR, AOV moderately sclerotic.       Family History  Problem Relation Age of Onset  . Cancer Mother   . Heart attack Father   . Stroke Father   . Heart attack Brother   . Heart attack Brother     Social History   Tobacco Use  . Smoking status: Former Smoker    Quit date: 01/13/1965    Years since quitting: 54.7  . Smokeless tobacco: Former Systems developer     Types: Secondary school teacher  . Vaping Use: Never used  Substance Use Topics  . Alcohol use: No  . Drug use: No    Home Medications Prior to Admission medications   Medication Sig Start Date End Date Taking? Authorizing Provider  amLODipine (NORVASC) 2.5 MG tablet Take 2.5 mg by mouth daily.  03/31/19   [provider]  apixaban (ELIQUIS) 2.5 MG TABS tablet Take 2.5 mg by mouth 2 (two) times daily.    [provider]  aspirin EC 81 MG tablet Take 81 mg by mouth daily.    [provider]  atorvastatin (LIPITOR) 40 MG tablet TAKE 1 TABLET BY MOUTH EVERY DAY 04/06/19   Hilty, Nadean Corwin, MD  cephALEXin (KEFLEX) 250 MG capsule Take 250 mg by mouth daily. 08/23/19   [provider]  clopidogrel (PLAVIX) 75 MG tablet Take 1 tablet (75 mg total) by mouth daily with breakfast. 12/10/18   Mercy Riding, MD  diclofenac Sodium (VOLTAREN) 1 % GEL Apply 2 g topically 4 (four) times daily. 01/05/19   Love, Ivan Anchors, PA-C  dorzolamide-timolol (COSOPT) 22.3-6.8 MG/ML ophthalmic solution Place 1 drop into both eyes 2 (two) times daily.    [provider]  famotidine (PEPCID) 20 MG tablet Take 1 tablet (20 mg total) by mouth daily. 11/12/18   Johnson, Clanford L, MD  ferrous sulfate 325 (65 FE) MG tablet Take 325 mg by mouth 2 (two) times daily with a meal.    [provider]  finasteride (PROSCAR) 5 MG tablet Take 1 tablet (5 mg total) by mouth every evening. 09/22/18   Angiulli, Lavon Paganini, PA-C  furosemide (LASIX) 40 MG tablet Take 40mg  by mouth in the morning and 20mg  by mouth in the evening.    [provider]  glipiZIDE (GLUCOTROL) 5 MG tablet Take 5 mg by mouth 2 (two) times daily.     [provider]  levothyroxine (SYNTHROID) 75 MCG tablet Take 75 mcg by mouth daily. 02/12/19   [provider]  methocarbamol (ROBAXIN) 500 MG tablet Take 1 tablet (500 mg total) by mouth every 6 (six) hours as needed for muscle spasms. 01/22/19    Debbe Odea, MD  Misc Natural Products (GLUCOSAMINE CHOND MSM FORMULA PO) Take 1 tablet by mouth 2 (two) times daily.    [provider]  Omega-3 Fatty Acids (FISH OIL PO) Take 1 capsule by mouth daily.    [provider]  [facility] ULTRA test strip 1 each 2 (two) times daily. 04/15/19   [provider]  polyethylene glycol (MIRALAX) 17 g packet  Take 17 g by mouth daily as needed for moderate constipation. 12/09/18   Mercy Riding, MD  potassium chloride (KLOR-CON) 10 MEQ tablet Take 1 tablet (10 mEq total) by mouth daily. 11/12/18   Johnson, Clanford L, MD  pyridoxine (B-6) 100 MG tablet Take 100 mg by mouth daily.    [provider]  tamsulosin (FLOMAX) 0.4 MG CAPS capsule Take 1 capsule (0.4 mg total) by mouth 2 (two) times daily. 09/22/18   Angiulli, Lavon Paganini, PA-C  traMADol (ULTRAM) 50 MG tablet Take 1 tablet (50 mg total) by mouth every 6 (six) hours as needed. 02/15/19   Lovorn, Jinny Blossom, MD  vitamin C (ASCORBIC ACID) 500 MG tablet Take 500 mg by mouth daily.    [provider]  vitamin E 400 UNIT capsule Take 400 Units by mouth daily.    [provider]    Allergies    Iodinated diagnostic agents and Dye fdc red [red dye]  Review of Systems   Review of Systems  Constitutional: Negative for fever.  HENT: Negative for sore throat.   Eyes: Negative for redness.  Respiratory: Negative for cough and shortness of breath.   Cardiovascular: Positive for chest pain. Negative for palpitations and leg swelling.  Gastrointestinal: Negative for abdominal pain, nausea and vomiting.  Genitourinary: Negative for flank pain.  Musculoskeletal: Negative for back pain and neck pain.  Skin: Negative for rash.  Neurological: Negative for headaches.  Hematological: Does not bruise/bleed easily.  Psychiatric/Behavioral: Negative for confusion.    Physical Exam Updated Vital Signs BP (!) 143/97 (BP Location: Right Arm)   Pulse (!) 101   Temp 97.6 F  (36.4 C) (Oral)   Resp 13   Ht 1.753 m (5\' 9" )   Wt 100.4 kg   SpO2 97%   BMI 32.70 kg/m   Physical Exam Vitals and nursing note reviewed.  Constitutional:      Appearance: Normal appearance. He is well-developed.  HENT:     Head: Atraumatic.     Nose: Nose normal.     Mouth/Throat:     Mouth: Mucous membranes are moist.     Pharynx: Oropharynx is clear.  Eyes:     General: No scleral icterus.    Conjunctiva/sclera: Conjunctivae normal.  Neck:     Trachea: No tracheal deviation.  Cardiovascular:     Rate and Rhythm: Normal rate and regular rhythm.     Pulses: Normal pulses.     Heart sounds: Normal heart sounds. No murmur heard.  No friction rub. No gallop.   Pulmonary:     Effort: Pulmonary effort is normal. No accessory muscle usage or respiratory distress.     Breath sounds: Normal breath sounds.  Chest:     Chest wall: No tenderness.  Abdominal:     General: Bowel sounds are normal. There is no distension.     Palpations: Abdomen is soft.     Tenderness: There is no abdominal tenderness. There is no guarding.  Genitourinary:    Comments: No cva tenderness. Musculoskeletal:        General: No swelling or tenderness.     Cervical back: Normal range of motion and neck supple. No rigidity.     Comments: bil amputee.   Skin:    General: Skin is warm and dry.     Findings: No rash.  Neurological:     Mental Status: He is alert.     Comments: Alert, speech clear.   Psychiatric:  Mood and Affect: Mood normal.      ED Results / Procedures / Treatments   Labs (all labs ordered are listed, but only abnormal results are displayed) Results for orders placed or performed during the hospital encounter of 10/04/19  Comprehensive metabolic panel  Result Value Ref Range   Sodium 133 (L) 135 - 145 mmol/L   Potassium 3.0 (L) 3.5 - 5.1 mmol/L   Chloride 94 (L) 98 - 111 mmol/L   CO2 26 22 - 32 mmol/L   Glucose, Bld 229 (H) 70 - 99 mg/dL   BUN 39 (H) 8 - 23 mg/dL    Creatinine, Ser 2.72 (H) 0.61 - 1.24 mg/dL   Calcium 9.0 8.9 - 10.3 mg/dL   Total Protein 7.0 6.5 - 8.1 g/dL   Albumin 3.2 (L) 3.5 - 5.0 g/dL   AST 26 15 - 41 U/L   ALT 17 0 - 44 U/L   Alkaline Phosphatase 61 38 - 126 U/L   Total Bilirubin 0.6 0.3 - 1.2 mg/dL   GFR calc non Af Amer 20 (L) >60 mL/min   GFR calc Af Amer 23 (L) >60 mL/min   Anion gap 13 5 - 15  CBC  Result Value Ref Range   WBC 10.9 (H) 4.0 - 10.5 K/uL   RBC 4.05 (L) 4.22 - 5.81 MIL/uL   Hemoglobin 12.7 (L) 13.0 - 17.0 g/dL   HCT 38.1 (L) 39 - 52 %   MCV 94.1 80.0 - 100.0 fL   MCH 31.4 26.0 - 34.0 pg   MCHC 33.3 30.0 - 36.0 g/dL   RDW 13.9 11.5 - 15.5 %   Platelets 222 150 - 400 K/uL   nRBC 0.0 0.0 - 0.2 %  Troponin I (High Sensitivity)  Result Value Ref Range   Troponin I (High Sensitivity) 272 (HH) <18 ng/L   DG Chest Port 1 View  Result Date: 10/04/2019 CLINICAL DATA:  Chest pain since 2 p.m. EXAM: PORTABLE CHEST 1 VIEW COMPARISON:  12/09/2018 FINDINGS: Single frontal view of the chest demonstrates an unremarkable cardiac silhouette. There is small residual left pleural effusion. No airspace disease or pneumothorax. No acute bony abnormalities. IMPRESSION: 1. Small residual left pleural effusion, markedly decreased since prior study. 2. No acute airspace disease. Electronically Signed   By: Randa Ngo M.D.   On: 10/04/2019 17:12    EKG EKG Interpretation  Date/Time:  Monday October 04 2019 17:13:00 EDT Ventricular Rate:  106 PR Interval:    QRS Duration: 150 QT Interval:  403 QTC Calculation: 536 R Axis:   95 Text Interpretation: Atrial fibrillation RBBB and LPFB Baseline wander Confirmed by Lajean Saver [phone]) on 10/04/2019 5:57:39 PM   Radiology DG Chest Port 1 View  Result Date: 10/04/2019 CLINICAL DATA:  Chest pain since 2 p.m. EXAM: PORTABLE CHEST 1 VIEW COMPARISON:  12/09/2018 FINDINGS: Single frontal view of the chest demonstrates an unremarkable cardiac silhouette. There is small  residual left pleural effusion. No airspace disease or pneumothorax. No acute bony abnormalities. IMPRESSION: 1. Small residual left pleural effusion, markedly decreased since prior study. 2. No acute airspace disease. Electronically Signed   By: Randa Ngo M.D.   On: 10/04/2019 17:12    Procedures Procedures (including critical care time)  Medications Ordered in ED Medications  acetaminophen (TYLENOL) tablet 650 mg (has no administration in time range)  famotidine (PEPCID) tablet 20 mg (has no administration in time range)  alum & mag hydroxide-simeth (MAALOX/MYLANTA) 200-200-20 MG/5ML suspension 30 mL (has no administration  in time range)    ED Course  I have reviewed the triage vital signs and the nursing notes.  Pertinent labs & imaging results that were available during my care of the patient were reviewed by me and considered in my medical decision making (see chart for details).    MDM Rules/Calculators/A&P                          Iv ns. Continuous pulse ox and monitor.  Stat labs and imaging.  Reviewed nursing notes and prior charts for additional history.   CXR reviewed/interpreted by me - no pna.   Labs reviewed/interpreted by me - initial trop high.  MDM Number of Diagnoses or Management Options   Amount and/or Complexity of Data Reviewed Clinical lab tests: ordered and reviewed Tests in the radiology section of CPT: ordered and reviewed Tests in the medicine section of CPT: ordered and reviewed Discussion of test results with the performing providers: yes Decide to obtain previous medical records or to obtain history from someone other than the patient: yes Obtain history from someone other than the patient: yes Review and summarize past medical records: yes Discuss the patient with other providers: yes Independent visualization of images, tracings, or specimens: yes  Risk of Complications, Morbidity, and/or Mortality Presenting problems:  high Diagnostic procedures: high Management options: high  Patient indicates earlier cp has resolved.   Additional labs reviewed/interprted by me - compared to prior labs, AKI noted. Iv ns bolus 500 cc over 1 hr.  K is low. kcl po. Mg added to labs.   Given new chest pain, elevated trop, AKI, hypokalemia, will consult medicine for admission. Delta trop pending. Cp currently resolved.    Discussed pt, elevated trop, AKI - will admit. Delta trop is pending.  Additional labs reviewed/interpreted by me - delta trop is increased from initial. Recheck pt - no cp currently.  Will repeat ecg. Cardiology consulted. Discussed pt, hx AS, increased in initial and further increase in delta trop, eliquis, aki - she indicates she will see in ED, agrees w hosp admit, would order heparin per pharmacy (indicates they can advise as aki and already on eliquis this AM), hold on additional meds and/or drips for now.   CRITICAL CARE  RE: NSTEMI/elevated troponin, aortic stenosis  Performed by: Mirna Mires Total critical care time: 45 minutes Critical care time was exclusive of separately billable procedures and treating other patients. Critical care was necessary to treat or prevent imminent or life-threatening deterioration. Critical care was time spent personally by me on the following activities: development of treatment plan with patient and/or surrogate as well as nursing, discussions with consultants, evaluation of patient's response to treatment, examination of patient, obtaining history from patient or surrogate, ordering and performing treatments and interventions, ordering and review of laboratory studies, ordering and review of radiographic studies, pulse oximetry and re-evaluation of patient's condition.    Final Clinical Impression(s) / ED Diagnoses Final diagnoses:  None    Rx / DC Orders ED Discharge Orders    None          Lajean Saver, MD 10/04/19 2041

## 2019-10-04 NOTE — ED Triage Notes (Signed)
Assume care from EMS, ems reports chest pain starting around 2pm today. Family reports given pt metoprolol due to racing HR. Ems Reports EKG Showed ST depression and MD rule out no stemi at this time, EMS give 324 of aspirin and reports hx of afib, and heart murmur where pt needs a valve replacement. Upon arrival to room pt complain of chest pain 6 out of 10 using the pain scale, Pt is place on the monitor x 3 call bell within reach and family at bedside

## 2019-10-04 NOTE — Telephone Encounter (Signed)
    Pt's son calling he is with the pt, he said pt been feeling indigestion pain above his abdomen up to his throat and his whole left arm feels funny, pt couldn't tell if its a chest pain, he just keep saying it's indigestion. His BP is 154/95, 175/97 with HR 107 and 114

## 2019-10-04 NOTE — H&P (Addendum)
History and Physical    [name] XFG:[id number] DOB: March 12, 1927 DOA: 10/04/2019  PCP: Redmond School, MD   Patient coming from: Home  Chief Complaint: Chest pain  HPI: [name] is a 86 y.o. male with medical history significant for atrial fibrillation, aortic stenosis, chronic diastolic heart failure, hypertension, diabetes mellitus type 2, peripheral vascular disease, s/p bilateral AKA and has bilateral prosthesis in place, hypothyroidism who presents EMS to the emergency room with complaint of chest pain.  Patient son is at bedside and helps provide history.  Patient returned home from rehab/PT appointment this afternoon around 2:00 pm and developed a substernal chest pressure and tightness in the upper chest.  States he was not doing any particular activity when the chest pressure began.  Reports he did not have chest pressure when he was at his physical therapy appointment earlier in the day.  After sitting and resting it continued to occur.  His son gave him a Pepcid for presumed indigestion but the pain did not improve.  He then developed left shoulder and left upper arm pain and so was brought to the emergency room for further evaluation.  He did not have any shortness of breath, nausea, vomiting or diaphoresis with the episode.  Reports that the pressure was an 8-9/10 at its worst and is currently solved.  He was given aspirin by EMS in route to the hospital.  Denies any complaints at this time. He has 1 son who lives with him during the week and his other children stay with him over the weekend he always has somebody at home with him. Reports he has been compliant with his medications.   ED Course: Mr. Fraley has an elevated troponin level in the emergency room.  Repeat troponin shows an increase from the initial level around 100 to over 500.  His EKG shows no ST elevation.  He is found to have AKI superimposed on his chronic kidney disease.  ER provider discussed with cardiology  who is going to see patient in the emergency room and provide recommendations.  Hospitalist service is asked to evaluate and help manage patient  Review of Systems:  General: Denies weakness, fever, chills, weight loss, night sweats.  Denies dizziness.  Denies change in appetite HENT: Denies head trauma, headache, denies change in hearing, tinnitus. Denies nasal congestion or bleeding. Denies sore throat.  Denies difficulty swallowing Eyes: Denies blurry vision, pain in eye, drainage.  Denies discoloration of eyes. Neck: Denies pain.  Denies swelling.  Denies pain with movement. Cardiovascular: Ports substernal chest pain.  Denies palpitations.  Denies edema.  Denies orthopnea Respiratory: Denies shortness of breath, cough.  Denies wheezing.  Denies sputum production Gastrointestinal: Denies abdominal pain, swelling.  Denies nausea, vomiting, diarrhea.  Denies melena.  Denies hematemesis. Musculoskeletal: Denies limitation of movement.  Denies deformity or swelling.  Denies pain.  Denies arthralgias or myalgias. Genitourinary: Denies pelvic pain.  Denies urinary frequency or hesitancy.  Denies dysuria.  Skin: Denies rash.  Denies petechiae, purpura, ecchymosis. Neurological: Denies headache.  Denies syncope.  Denies seizure activity.  Denies weakness or paresthesia.  Denies slurred speech, drooping face.  Denies visual change. Psychiatric: Denies depression, anxiety.  Denies suicidal thoughts or ideation.  Denies hallucinations.  Past Medical History:  Diagnosis Date  . Arthritis   . Borderline diabetic   . CHF (congestive heart failure) ([place])   . Chronic kidney disease   . Diabetes mellitus without complication ([place])   . Dysrhythmia   . Glaucoma   .  Hyperlipidemia   . Hypertension   . Hypothyroidism   . PVD (peripheral vascular disease) ([place])   . Sleep apnea    Cpap ordered but doesnt use  . Urinary retention due to benign prostatic hyperplasia 12/28/2018    Past Surgical History:   Procedure Laterality Date  . ABDOMINAL AORTOGRAM W/LOWER EXTREMITY N/A 07/27/2018   Procedure: ABDOMINAL AORTOGRAM W/LOWER EXTREMITY;  Surgeon: Lorretta Harp, MD;  Location: [facility];  Service: Cardiovascular;  Laterality: N/A;  . AMPUTATION Right 08/28/2018   Procedure: RIGHT BELOW KNEE AMPUTATION;  Surgeon: [name], MD;  Location: [facility];  Service: Orthopedics;  Laterality: Right;  . AMPUTATION Left 12/02/2018   Procedure: LEFT TRANSMETATARSAL AMPUTATION AND NEGATIVE PRESSURE WOUND VAC PLACEMENT;  Surgeon: [name], MD;  Location: [place];  Service: Orthopedics;  Laterality: Left;  . AMPUTATION Left 12/18/2018   Procedure: LEFT BELOW KNEE AMPUTATION;  Surgeon: [name], MD;  Location: [facility];  Service: Orthopedics;  Laterality: Left;  . APPENDECTOMY  1943  . BELOW KNEE LEG AMPUTATION Left 12/18/2018  . CATARACT EXTRACTION  2012   x2  . COLONOSCOPY N/A 11/01/2014   Procedure: COLONOSCOPY;  Surgeon: Aviva Signs, MD;  [facility];  Service: Gastroenterology;  Laterality: N/A;  . ESOPHAGOGASTRODUODENOSCOPY N/A 11/01/2014   Procedure: ESOPHAGOGASTRODUODENOSCOPY (EGD);  Surgeon: Aviva Signs, MD;  [facility];  Service: Gastroenterology;  Laterality: N/A;  . HERNIA REPAIR Left   . INGUINAL HERNIA REPAIR Right 03/01/2013   Procedure: RIGHT INGUINAL HERNIORRHAPHY;  Surgeon: Jamesetta So, MD;  Location: [facility];  Service: General;  Laterality: Right;  . INSERTION OF MESH Right 03/01/2013   Procedure: INSERTION OF MESH;  Surgeon: Jamesetta So, MD;  [facility];  Service: General;  Laterality: Right;  . LOWER EXTREMITY ANGIOGRAPHY Right 08/25/2018   Procedure: LOWER EXTREMITY ANGIOGRAPHY;  Surgeon: Wellington Hampshire, MD;  Location: [facility];  Service: Cardiovascular;  Laterality: Right;  . LOWER EXTREMITY ANGIOGRAPHY N/A 11/25/2018   Procedure: LOWER EXTREMITY ANGIOGRAPHY;  Surgeon: Wellington Hampshire, MD;  Location: [facility];  Service: Cardiovascular;  Laterality: N/A;  . NM MYOCAR PERF WALL MOTION  06/05/2010   no significant ischemia  . PERIPHERAL VASCULAR ATHERECTOMY Right 08/03/2018   Procedure: PERIPHERAL VASCULAR ATHERECTOMY;  Surgeon: Lorretta Harp, MD;  Location: [facility];  Service: Cardiovascular;  Laterality: Right;  . PERIPHERAL VASCULAR ATHERECTOMY Left 11/23/2018   Procedure: PERIPHERAL VASCULAR ATHERECTOMY;  Surgeon: Lorretta Harp, MD;  Location: [place] [facility];  Service: Cardiovascular;  Laterality: Left;  sfa with dc balloon  . PERIPHERAL VASCULAR BALLOON ANGIOPLASTY Left 11/25/2018   Procedure: PERIPHERAL VASCULAR BALLOON ANGIOPLASTY;  Surgeon: Wellington Hampshire, MD;  Location: [facility];  Service: Cardiovascular;  Laterality: Left;  popliteal  . PERIPHERAL VASCULAR INTERVENTION Left 11/25/2018   Procedure: PERIPHERAL VASCULAR INTERVENTION;  Surgeon: Wellington Hampshire, MD;  Location: [facility];  Service: Cardiovascular;  Laterality: Left;  tibial peroneal trunk and peroneal stents   . stent  06/14/2010   left leg  . STUMP REVISION Left 01/20/2019   Procedure: REVISION LEFT BELOW KNEE AMPUTATION;  Surgeon: [name], MD;  Location: [facility];  Service: Orthopedics;  Laterality: Left;  . US ECHOCARDIOGRAPHY  01/21/2006   moderate mitral annular ca+, mild MR, AOV moderately sclerotic.    Social History  reports that he quit smoking about 54 years ago. He  has quit using smokeless tobacco.  His smokeless tobacco use included chew. He reports that he does not drink alcohol and does not use drugs.  Allergies  Allergen Reactions  . Iodinated Diagnostic Agents     Other reaction(s): Fever  . Dye Fdc Red [Red Dye] Hives    Tolerated red Docusate, Niferex    Family History  Problem Relation Age of Onset  . Cancer Mother   . Heart attack Father   . Stroke Father   . Heart attack Brother   . Heart attack Brother      Prior to Admission medications    Medication Sig Start Date End Date Taking? Authorizing Provider  amLODipine (NORVASC) 2.5 MG tablet Take 2.5 mg by mouth daily.  03/31/19   [provider]  apixaban (ELIQUIS) 2.5 MG TABS tablet Take 2.5 mg by mouth 2 (two) times daily.    [provider]  aspirin EC 81 MG tablet Take 81 mg by mouth daily.    [provider]  atorvastatin (LIPITOR) 40 MG tablet TAKE 1 TABLET BY MOUTH EVERY DAY 04/06/19   Hilty, Nadean Corwin, MD  cephALEXin (KEFLEX) 250 MG capsule Take 250 mg by mouth daily. 08/23/19   [provider]  clopidogrel (PLAVIX) 75 MG tablet Take 1 tablet (75 mg total) by mouth daily with breakfast. 12/10/18   Mercy Riding, MD  diclofenac Sodium (VOLTAREN) 1 % GEL Apply 2 g topically 4 (four) times daily. 01/05/19   Love, Ivan Anchors, PA-C  dorzolamide-timolol (COSOPT) 22.3-6.8 MG/ML ophthalmic solution Place 1 drop into both eyes 2 (two) times daily.    [provider]  famotidine (PEPCID) 20 MG tablet Take 1 tablet (20 mg total) by mouth daily. 11/12/18   Johnson, Clanford L, MD  ferrous sulfate 325 (65 FE) MG tablet Take 325 mg by mouth 2 (two) times daily with a meal.    [provider]  finasteride (PROSCAR) 5 MG tablet Take 1 tablet (5 mg total) by mouth every evening. 09/22/18   Angiulli, Lavon Paganini, PA-C  furosemide (LASIX) 40 MG tablet Take 40mg  by mouth in the morning and 20mg  by mouth in the evening.    [provider]  glipiZIDE (GLUCOTROL) 5 MG tablet Take 5 mg by mouth 2 (two) times daily.     [provider]  levothyroxine (SYNTHROID) 75 MCG tablet Take 75 mcg by mouth daily. 02/12/19   [provider]  methocarbamol (ROBAXIN) 500 MG tablet Take 1 tablet (500 mg total) by mouth every 6 (six) hours as needed for muscle spasms. 01/22/19   Debbe Odea, MD  Misc Natural Products (GLUCOSAMINE CHOND MSM FORMULA PO) Take 1 tablet by mouth 2 (two) times daily.    [provider]  Omega-3 Fatty Acids (FISH  OIL PO) Take 1 capsule by mouth daily.    [provider]  [facility] ULTRA test strip 1 each 2 (two) times daily. 04/15/19   [provider]  polyethylene glycol (MIRALAX) 17 g packet Take 17 g by mouth daily as needed for moderate constipation. 12/09/18   Mercy Riding, MD  potassium chloride (KLOR-CON) 10 MEQ tablet Take 1 tablet (10 mEq total) by mouth daily. 11/12/18   Johnson, Clanford L, MD  pyridoxine (B-6) 100 MG tablet Take 100 mg by mouth daily.    [provider]  tamsulosin (FLOMAX) 0.4 MG CAPS capsule Take 1 capsule (0.4 mg total) by mouth 2 (two) times daily. 09/22/18   [place], Lavon Paganini,  PA-C  traMADol (ULTRAM) 50 MG tablet Take 1 tablet (50 mg total) by mouth every 6 (six) hours as needed. 02/15/19   Lovorn, Jinny Blossom, MD  vitamin C (ASCORBIC ACID) 500 MG tablet Take 500 mg by mouth daily.    [provider]  vitamin E 400 UNIT capsule Take 400 Units by mouth daily.    [provider]    Physical Exam: Vitals:   10/04/19 1815 10/04/19 1845 10/04/19 1900 10/04/19 1939  BP: 120/83 126/89 137/76 (!) 149/97  Pulse: 83 100 78 99  Resp: 10 13 14 14   Temp:      TempSrc:      SpO2: 96% 97% 95% 95%  Weight:      Height:        Constitutional: NAD, calm, comfortable Vitals:   10/04/19 1815 10/04/19 1845 10/04/19 1900 10/04/19 1939  BP: 120/83 126/89 137/76 (!) 149/97  Pulse: 83 100 78 99  Resp: 10 13 14 14   Temp:      TempSrc:      SpO2: 96% 97% 95% 95%  Weight:      Height:       General: WDWN, Alert and oriented x3.  Eyes: EOMI, PERRL, lids and conjunctivae normal.  Sclera nonicteric HENT:  [place]/AT, external ears normal.  Nares patent without epistasis.  Mucous membranes are moist. Posterior pharynx clear of any exudate or lesions.  Dentures in place Neck: Soft, normal range of motion, supple, no masses, no thyromegaly. Trachea midline Respiratory: clear to auscultation bilaterally, no wheezing, no crackles. Normal respiratory effort.  No accessory muscle use.  Cardiovascular: Regular rate and rhythm, Has 3/6 systolic murmur. No rubs / gallops.  No carotid bruits.  Abdomen: Soft, no tenderness, nondistended, no rebound or guarding.  No masses palpated. No hepatosplenomegaly. Bowel sounds normoactive Musculoskeletal: FROM. no clubbing / cyanosis.  Normal muscle tone.  Bilateral lower leg prosthesis in place Skin: Warm, dry, intact no rashes, lesions, ulcers. No induration Neurologic: CN 2-12 grossly intact.  Normal speech.  Sensation intact, grip strength 4 out of 5 bilaterally Psychiatric: Normal judgment and insight.  Normal mood.    Labs on Admission: I have personally reviewed following labs and imaging studies  CBC: Recent Labs  Lab 10/04/19 1800  WBC 10.9*  HGB 12.7*  HCT 38.1*  MCV 94.1  PLT 297    Basic Metabolic Panel: Recent Labs  Lab 10/04/19 1800 10/04/19 1910  NA 133*  --   K 3.0*  --   CL 94*  --   CO2 26  --   GLUCOSE 229*  --   BUN 39*  --   CREATININE 2.72*  --   CALCIUM 9.0  --   MG  --  2.5*    GFR: Estimated Creatinine Clearance: 21.1 mL/min (A) (by C-G formula based on SCr of 2.72 mg/dL (H)).  Liver Function Tests: Recent Labs  Lab 10/04/19 1800  AST 26  ALT 17  ALKPHOS 61  BILITOT 0.6  PROT 7.0  ALBUMIN 3.2*    Urine analysis:    Component Value Date/Time   COLORURINE STRAW (A) 02/02/2019 1924   APPEARANCEUR CLEAR 02/02/2019 1924   LABSPEC 1.004 (L) 02/02/2019 1924   PHURINE 7.0 02/02/2019 1924   GLUCOSEU NEGATIVE 02/02/2019 1924   HGBUR NEGATIVE 02/02/2019 1924   BILIRUBINUR NEGATIVE 02/02/2019 1924   KETONESUR NEGATIVE 02/02/2019 1924   PROTEINUR NEGATIVE 02/02/2019 1924   NITRITE NEGATIVE 02/02/2019 1924   LEUKOCYTESUR MODERATE (A) 02/02/2019 1924  Radiological Exams on Admission: DG Chest Port 1 View  Result Date: 10/04/2019 CLINICAL DATA:  Chest pain since 2 p.m. EXAM: PORTABLE CHEST 1 VIEW COMPARISON:  12/09/2018 FINDINGS: Single frontal view of  the chest demonstrates an unremarkable cardiac silhouette. There is small residual left pleural effusion. No airspace disease or pneumothorax. No acute bony abnormalities. IMPRESSION: 1. Small residual left pleural effusion, markedly decreased since prior study. 2. No acute airspace disease. Electronically Signed   By: Randa Ngo M.D.   On: 10/04/2019 17:12    EKG: Independently reviewed.  EKG is reviewed and shows atrial fibrillation with nonspecific ST changes.  Prolonged QTc of 536  Assessment/Plan Principal Problem:   NSTEMI (non-ST elevated myocardial infarction) [facility]) Patient replaced on cardiac telemetry for observation and medical management of NSTEMI.  Obtain serial troponin levels overnight. Troponin level increasing. Cardiology consulted by ER physician and Cardiology will evaluate pt. Antiplatelet therapy with aspirin daily.  Continue statin therapy.  Monitor blood pressure.  Nitroglycerin as needed.  Pulmonal oxygen as needed to maintain O2 sat between 92-96%  Active Problems:   Acute kidney injury superimposed on CKD [facility]) Patient with mild AKI.  Gentle IV fluid hydration with LR at 50 mL's per hour Recheck electrolytes renal function morning    Essential hypertension, benign Continue patient's home dose of Norvasc.  We will add low-dose Coreg 3.125 mg twice      Hypokalemia  Patient was given 40 mEq of potassium in the emergency room.  We will give another 20 mEq p.o. tonight.  Continue daily potassium supplementation that patient takes at home    S/P BKA (below knee amputation) unilateral, right (HCC)       DVT prophylaxis: Patient is anticoagulated on Eliquis which will be converted to heparin with NSTEMI Code Status:   Full code.  CODE STATUS was discussed with patient and his son.  Son states that Mr. Pasquarelli is a full code and would want to be resuscitated if his heart were to stop or he would stop breathing.  RN was in room and witnessed discussion Family  Communication:  Diagnosis and plan discussed with patient and his son who is at bedside.  Questions were answered.  They verbalized understanding and agree with plan.  Further recommendations to follow as indicated Disposition Plan:   Patient is from:  Home  Anticipated DC to:  Home  Anticipated DC date:  Anticipate less than 2 midnight stay in the hospital  Anticipated DC barriers: No barriers to discharge identified at this time  Consults:  Cardiology.  ER physician discussed with on-call cardiology who states they will see patient in the emergency room for evaluation Admission status:  Observation  Severity of Illness: The appropriate patient status for this patient is OBSERVATION. Observation status is judged to be reasonable and necessary in order to provide the required intensity of service to ensure the patient's safety. The patient's presenting symptoms, physical exam findings, and initial radiographic and laboratory data in the context of their medical condition is felt to place them at decreased risk for further clinical deterioration. Furthermore, it is anticipated that the patient will be medically stable for discharge from the hospital within 2 midnights of admission. The following factors support the patient status of observation.    Yevonne Aline Jasmen Emrich MD [facility]  How to contact the [facility] Attending or Consulting provider [place] or covering provider during after hours [place], for this patient?   1. Check the care team in  [facility] and look for a) attending/consulting [facility] provider listed and b) the [facility] team listed 2. Log into [email or web address] and use [place]'s universal password to access. If you do not have the password, please contact the hospital operator. 3. Locate the [facility] provider you are looking for under [facility] and page to a number that you can be directly reached. 4. If you still have difficulty reaching the provider, please page the [facility] (Director on Call)  for the Hospitalists listed on amion for assistance.  10/04/2019, 8:42 PM

## 2019-10-04 NOTE — Telephone Encounter (Signed)
Thanks .. will keep an eye out for him if he comes to [facility].  Dr. Lemmie Evens

## 2019-10-04 NOTE — Telephone Encounter (Signed)
Called and spoke to son, he is there with his father and his aunt who is a retired [employer] scientist- patient had rehab this morning and on the way home started having indigestion like pain- so when they got home he gave him two tums to take which did not help the pain, he is having left sided arm pain from his hand into his upper arm. He denies being SOB or any weight gain- weight today was 221.4. HR has been jumping from 107 to 114- and then earlier they checked BP it has been 154/95, 175/97, and just now it was 150/101 after taking a 1.2 metoprolol tablet that his son gave him 1 hour prior. HR just jumped from 91 to 123. No other med changes that Flomax which was taken off last Friday. I spoke with DOD Dr.Acharya who looked through the chart and advised with me that with his symptoms and no significant changes with BP and his history- sounds like he could be in AFIB- but to have him go by EMS to be evaluated for any further cardiac symptoms.   Son and aunt verbalized understanding.  Notified that I would send a message to MD and nurse to make aware.

## 2019-10-05 ENCOUNTER — Other Ambulatory Visit: Payer: Self-pay | Admitting: *Deleted

## 2019-10-05 ENCOUNTER — Encounter: Payer: Self-pay | Admitting: Physical Therapy

## 2019-10-05 DIAGNOSIS — K219 Gastro-esophageal reflux disease without esophagitis: Secondary | ICD-10-CM | POA: Diagnosis present

## 2019-10-05 DIAGNOSIS — E876 Hypokalemia: Secondary | ICD-10-CM | POA: Diagnosis present

## 2019-10-05 DIAGNOSIS — Z89511 Acquired absence of right leg below knee: Secondary | ICD-10-CM

## 2019-10-05 DIAGNOSIS — N182 Chronic kidney disease, stage 2 (mild): Secondary | ICD-10-CM | POA: Diagnosis present

## 2019-10-05 DIAGNOSIS — N179 Acute kidney failure, unspecified: Secondary | ICD-10-CM

## 2019-10-05 DIAGNOSIS — E785 Hyperlipidemia, unspecified: Secondary | ICD-10-CM | POA: Diagnosis present

## 2019-10-05 DIAGNOSIS — Z66 Do not resuscitate: Secondary | ICD-10-CM | POA: Diagnosis present

## 2019-10-05 DIAGNOSIS — Z7189 Other specified counseling: Secondary | ICD-10-CM | POA: Diagnosis not present

## 2019-10-05 DIAGNOSIS — I130 Hypertensive heart and chronic kidney disease with heart failure and stage 1 through stage 4 chronic kidney disease, or unspecified chronic kidney disease: Secondary | ICD-10-CM | POA: Diagnosis present

## 2019-10-05 DIAGNOSIS — Z515 Encounter for palliative care: Secondary | ICD-10-CM | POA: Diagnosis not present

## 2019-10-05 DIAGNOSIS — Z20822 Contact with and (suspected) exposure to covid-19: Secondary | ICD-10-CM | POA: Diagnosis present

## 2019-10-05 DIAGNOSIS — I5032 Chronic diastolic (congestive) heart failure: Secondary | ICD-10-CM | POA: Diagnosis not present

## 2019-10-05 DIAGNOSIS — I350 Nonrheumatic aortic (valve) stenosis: Secondary | ICD-10-CM | POA: Diagnosis not present

## 2019-10-05 DIAGNOSIS — N170 Acute kidney failure with tubular necrosis: Secondary | ICD-10-CM | POA: Diagnosis not present

## 2019-10-05 DIAGNOSIS — J811 Chronic pulmonary edema: Secondary | ICD-10-CM | POA: Diagnosis not present

## 2019-10-05 DIAGNOSIS — E871 Hypo-osmolality and hyponatremia: Secondary | ICD-10-CM | POA: Diagnosis not present

## 2019-10-05 DIAGNOSIS — I214 Non-ST elevation (NSTEMI) myocardial infarction: Principal | ICD-10-CM

## 2019-10-05 DIAGNOSIS — I482 Chronic atrial fibrillation, unspecified: Secondary | ICD-10-CM

## 2019-10-05 DIAGNOSIS — J9601 Acute respiratory failure with hypoxia: Secondary | ICD-10-CM | POA: Diagnosis not present

## 2019-10-05 DIAGNOSIS — I480 Paroxysmal atrial fibrillation: Secondary | ICD-10-CM | POA: Diagnosis present

## 2019-10-05 DIAGNOSIS — E1151 Type 2 diabetes mellitus with diabetic peripheral angiopathy without gangrene: Secondary | ICD-10-CM | POA: Diagnosis present

## 2019-10-05 DIAGNOSIS — G9341 Metabolic encephalopathy: Secondary | ICD-10-CM | POA: Diagnosis not present

## 2019-10-05 DIAGNOSIS — N189 Chronic kidney disease, unspecified: Secondary | ICD-10-CM

## 2019-10-05 DIAGNOSIS — N281 Cyst of kidney, acquired: Secondary | ICD-10-CM | POA: Diagnosis not present

## 2019-10-05 DIAGNOSIS — E1122 Type 2 diabetes mellitus with diabetic chronic kidney disease: Secondary | ICD-10-CM | POA: Diagnosis present

## 2019-10-05 DIAGNOSIS — N390 Urinary tract infection, site not specified: Secondary | ICD-10-CM | POA: Diagnosis present

## 2019-10-05 DIAGNOSIS — I4510 Unspecified right bundle-branch block: Secondary | ICD-10-CM | POA: Diagnosis present

## 2019-10-05 DIAGNOSIS — E039 Hypothyroidism, unspecified: Secondary | ICD-10-CM | POA: Diagnosis present

## 2019-10-05 DIAGNOSIS — E1165 Type 2 diabetes mellitus with hyperglycemia: Secondary | ICD-10-CM | POA: Diagnosis present

## 2019-10-05 DIAGNOSIS — I4819 Other persistent atrial fibrillation: Secondary | ICD-10-CM | POA: Diagnosis not present

## 2019-10-05 DIAGNOSIS — H409 Unspecified glaucoma: Secondary | ICD-10-CM | POA: Diagnosis present

## 2019-10-05 DIAGNOSIS — N183 Chronic kidney disease, stage 3 unspecified: Secondary | ICD-10-CM | POA: Diagnosis not present

## 2019-10-05 LAB — BASIC METABOLIC PANEL
Anion gap: 13 (ref 5–15)
BUN: 38 mg/dL — ABNORMAL HIGH (ref 8–23)
CO2: 26 mmol/L (ref 22–32)
Calcium: 9.2 mg/dL (ref 8.9–10.3)
Chloride: 96 mmol/L — ABNORMAL LOW (ref 98–111)
Creatinine, Ser: 2.55 mg/dL — ABNORMAL HIGH (ref 0.61–1.24)
GFR calc Af Amer: 25 mL/min — ABNORMAL LOW (ref >60)
GFR calc non Af Amer: 21 mL/min — ABNORMAL LOW (ref >60)
Glucose, Bld: 203 mg/dL — ABNORMAL HIGH (ref 70–99)
Potassium: 3.5 mmol/L (ref 3.5–5.1)
Sodium: 135 mmol/L (ref 135–145)

## 2019-10-05 LAB — HEMOGLOBIN A1C
Hgb A1c MFr Bld: 8.5 % — ABNORMAL HIGH (ref 4.8–5.6)
Mean Plasma Glucose: 197.25 mg/dL

## 2019-10-05 LAB — CBC
HCT: 38.7 % — ABNORMAL LOW (ref 39.0–52.0)
Hemoglobin: 13.2 g/dL (ref 13.0–17.0)
MCH: 32.3 pg (ref 26.0–34.0)
MCHC: 34.1 g/dL (ref 30.0–36.0)
MCV: 94.6 fL (ref 80.0–100.0)
Platelets: 211 10*3/uL (ref 150–400)
RBC: 4.09 MIL/uL — ABNORMAL LOW (ref 4.22–5.81)
RDW: 14.3 % (ref 11.5–15.5)
WBC: 9.7 10*3/uL (ref 4.0–10.5)
nRBC: 0 % (ref 0.0–0.2)

## 2019-10-05 LAB — TSH: TSH: 2.567 u[IU]/mL (ref 0.350–4.500)

## 2019-10-05 LAB — APTT
aPTT: 81 seconds — ABNORMAL HIGH (ref 24–36)
aPTT: 139 seconds — ABNORMAL HIGH (ref 24–36)
aPTT: 92 seconds — ABNORMAL HIGH (ref 24–36)

## 2019-10-05 LAB — CBG MONITORING, ED: Glucose-Capillary: 194 mg/dL — ABNORMAL HIGH (ref 70–99)

## 2019-10-05 LAB — GLUCOSE, CAPILLARY
Glucose-Capillary: 186 mg/dL — ABNORMAL HIGH (ref 70–99)
Glucose-Capillary: 257 mg/dL — ABNORMAL HIGH (ref 70–99)
Glucose-Capillary: 183 mg/dL — ABNORMAL HIGH (ref 70–99)
Glucose-Capillary: 233 mg/dL — ABNORMAL HIGH (ref 70–99)

## 2019-10-05 LAB — SARS CORONAVIRUS 2 BY RT PCR (HOSPITAL ORDER, PERFORMED IN ~~LOC~~ HOSPITAL LAB): SARS Coronavirus 2: NEGATIVE

## 2019-10-05 LAB — HEPARIN LEVEL (UNFRACTIONATED): Heparin Unfractionated: >2.2 IU/mL — ABNORMAL HIGH (ref 0.30–0.70)

## 2019-10-05 MED ORDER — HEPARIN (PORCINE) 25000 UT/250ML-% IV SOLN
950.0000 [IU]/h | INTRAVENOUS | Status: DC
  Administered 2019-10-05 – 2019-10-06 (×2): 950 [IU]/h via INTRAVENOUS
  Filled 2019-10-05: qty 250

## 2019-10-05 MED ORDER — CHLORHEXIDINE GLUCONATE CLOTH 2 % EX PADS
6.0000 | MEDICATED_PAD | Freq: Every day | CUTANEOUS | Status: DC
  Administered 2019-10-05 – 2019-10-08 (×4): 6 via TOPICAL

## 2019-10-05 MED ORDER — CARVEDILOL 6.25 MG PO TABS
6.2500 mg | ORAL_TABLET | Freq: Two times a day (BID) | ORAL | Status: DC
  Administered 2019-10-05 – 2019-10-06 (×2): 6.25 mg via ORAL
  Filled 2019-10-05 (×3): qty 1

## 2019-10-05 NOTE — Progress Notes (Signed)
Progress Note  Patient Name: [name] Date of Encounter: 10/05/2019  Primary Cardiologist:  Pixie Casino, MD  Subjective   No more CP, no SOB  Inpatient Medications    Scheduled Meds: . amLODipine  2.5 mg Oral Daily  . aspirin EC  81 mg Oral Daily  . atorvastatin  40 mg Oral Daily  . carvedilol  3.125 mg Oral BID WC  . Chlorhexidine Gluconate Cloth  6 each Topical Daily  . dorzolamide-timolol  1 drop Both Eyes BID  . finasteride  5 mg Oral QPM  . furosemide  40 mg Oral BID  . insulin aspart  0-5 Units Subcutaneous QHS  . insulin aspart  0-9 Units Subcutaneous TID WC  . levothyroxine  75 mcg Oral Daily  . potassium chloride  10 mEq Oral Daily  . tamsulosin  0.4 mg Oral BID   Continuous Infusions: . heparin 1,150 Units/hr (10/05/19 0139)  . lactated ringers 50 mL/hr at 10/05/19 0354   PRN Meds: acetaminophen   Vital Signs    Vitals:   10/05/19 0300 10/05/19 0330 10/05/19 0452 10/05/19 0651  BP: 118/81 116/82 137/85 (!) 120/92  Pulse: (!) 104 100  (!) 104  Resp: 13 13 20 14   Temp:   98.3 F (36.8 C) 97.6 F (36.4 C)  TempSrc:   Oral Oral  SpO2: 93% 92% 97% 96%  Weight:   94.2 kg   Height:   4\' 8"  (1.422 m)     Intake/Output Summary (Last 24 hours) at 10/05/2019 0755 Last data filed at 10/05/2019 0600 Gross per 24 hour  Intake 155.03 ml  Output --  Net 155.03 ml   Filed Weights   10/04/19 1702 10/05/19 0452  Weight: 100.4 kg 94.2 kg   Last Weight  Most recent update: 10/05/2019  4:52 AM   Weight  94.2 kg (207 lb 10.8 oz)           Weight change:    Telemetry    Atrial fib, mostly RVR - Personally Reviewed  ECG    09/21 ECG is Afib, HR 98, RBBB is old (in SR 09/02/2019)- Personally Reviewed  Physical Exam   General: Well developed, well nourished, male appearing in no acute distress. Head: Normocephalic, atraumatic.  Neck: Supple without bruits, JVD not elevated. Lungs:  Resp regular and unlabored, CTA. Heart: Irreg R&R, S1, S2,  no S3, S4, or murmur; no rub. Abdomen: Soft, non-tender, non-distended with normoactive bowel sounds. No hepatomegaly. No rebound/guarding. No obvious abdominal masses. Extremities: s/p bilateral BKA, radial pulses are 2+ bilaterally. Neuro: sleepy, rouses easily to verbal. Moves all extremities spontaneously. Psych: Normal affect.  Labs    Hematology Recent Labs  Lab 10/04/19 1800 10/05/19 0333  WBC 10.9* 9.7  RBC 4.05* 4.09*  HGB 12.7* 13.2  HCT 38.1* 38.7*  MCV 94.1 94.6  MCH 31.4 32.3  MCHC 33.3 34.1  RDW 13.9 14.3  PLT 222 211    Chemistry Recent Labs  Lab 10/04/19 1800 10/05/19 0333  NA 133* 135  K 3.0* 3.5  CL 94* 96*  CO2 26 26  GLUCOSE 229* 203*  BUN 39* 38*  CREATININE 2.72* 2.55*  CALCIUM 9.0 9.2  PROT 7.0  --   ALBUMIN 3.2*  --   AST 26  --   ALT 17  --   ALKPHOS 61  --   BILITOT 0.6  --   GFRNONAA 20* 21*  GFRAA 23* 25*  ANIONGAP 13 13     High  Sensitivity Troponin:   Recent Labs  Lab 10/04/19 1800 10/04/19 1910  TROPONINIHS 272* 572*     Lab Results  Component Value Date   CHOL 80 02/03/2019   HDL 26 (L) 02/03/2019   LDLCALC 37 02/03/2019   TRIG 85 02/03/2019   CHOLHDL 3.1 02/03/2019     BNPNo results for input(s): BNP, PROBNP in the last 168 hours.   DDimer No results for input(s): DDIMER in the last 168 hours.   Radiology    DG Chest Port 1 View  Result Date: 10/04/2019 CLINICAL DATA:  Chest pain since 2 p.m. EXAM: PORTABLE CHEST 1 VIEW COMPARISON:  12/09/2018 FINDINGS: Single frontal view of the chest demonstrates an unremarkable cardiac silhouette. There is small residual left pleural effusion. No airspace disease or pneumothorax. No acute bony abnormalities. IMPRESSION: 1. Small residual left pleural effusion, markedly decreased since prior study. 2. No acute airspace disease. Electronically Signed   By: Randa Ngo M.D.   On: 10/04/2019 17:12     Cardiac Studies   ECHO:  09/04/2019 1. Left ventricular ejection  fraction, by estimation, is 55 to 60%. The  left ventricle has normal function. The left ventricle has no regional  wall motion abnormalities. There is moderate left ventricular hypertrophy.  Left ventricular diastolic parameters are consistent with Grade I diastolic dysfunction (impaired relaxation). Elevated left atrial pressure.  2. Right ventricular systolic function is normal. The right ventricular  size is normal.  3. The mitral valve is normal in structure. No evidence of mitral valve  regurgitation. No evidence of mitral stenosis.  4. The aortic valve has an indeterminant number of cusps. Aortic valve  regurgitation is not visualized. Moderate to severe aortic valve  stenosis.Aortic valve mean gradient measures 27.4 mmHg. Aortic valve peak gradient measures 44.6 mmHg. Aortic valve  area, by VTI measures 0.77 cm.   Patient Profile     86 y.o. male w/ hx  HTN, HLD, PAD s/p bilateral amputation, diastolic HF, moderate - severe AS, aF on eliquis was admitted 09/20 w/ NSTEMI.   Assessment & Plan    1. NSTEMI - med rx for now w/ AKI and Eliquis taken yesterday am - no CP now - continue heparin, ASA - Increase Coreg 3.125 >> 6.25 mg bid - ck lipids, but LDL was very low at last check  2. AKI - pt getting LR 50 cc/hr - will hold Lasix - discuss w/ MD changing IVF to NS  3. AS - mod-severe by recent echo - may have contributed to NSTEMI - at this time, cannot pursue further eval w/ cath/CT - discuss w/ MD if we should consider TEE this admit  [place], per IM Principal Problem:   NSTEMI (non-ST elevated myocardial infarction) [facility]) Active Problems:   Essential hypertension, benign   Moderate aortic stenosis   Chronic diastolic CHF (congestive heart failure) (HCC)   Atrial fibrillation, chronic   Type 2 diabetes mellitus with vascular disease (HCC)   S/P BKA (below knee amputation) unilateral, right ([place])   Acute kidney injury superimposed on CKD ([place])    Hypokalemia    Signed, Rosaria Ferries , PA-C 7:55 AM 10/05/2019 Pager: [phone]

## 2019-10-05 NOTE — ED Notes (Signed)
[name], son, would like any & all updates called to him, no matter the time. Number in chart

## 2019-10-05 NOTE — Progress Notes (Signed)
   10/05/19 0452  Assess: MEWS Score  Temp 98.3 F (36.8 C)  BP 137/85  ECG Heart Rate (!) 114  Resp 20  Level of Consciousness Alert  SpO2 97 %  O2 Device Room Air  Assess: MEWS Score  MEWS Temp 0  MEWS Systolic 0  MEWS Pulse 2  MEWS RR 0  MEWS LOC 0  MEWS Score 2  MEWS Score Color Yellow  Assess: if the MEWS score is Yellow or Red  Were vital signs taken at a resting state? Yes  Focused Assessment No change from prior assessment  Early Detection of Sepsis Score *See Row Information* Low  MEWS guidelines implemented *See Row Information* Yes  Treat  MEWS Interventions Escalated (See documentation below)  Pain Scale 0-10  Pain Score 0  Take Vital Signs  Increase Vital Sign Frequency  Yellow: Q 2hr X 2 then Q 4hr X 2, if remains yellow, continue Q 4hrs  Escalate  MEWS: Escalate Yellow: discuss with charge nurse/RN and consider discussing with provider and RRT  Notify: Charge Nurse/RN  Name of Charge Nurse/RN Notified Christina   Date Charge Nurse/RN Notified 10/05/19  Time Charge Nurse/RN Notified [phone]  Document  Patient Outcome Other (Comment) (pt stable and remains on department )  Progress note created (see row info) Yes

## 2019-10-05 NOTE — Progress Notes (Signed)
Inpatient Diabetes Program Recommendations  AACE/ADA: New Consensus Statement on Inpatient Glycemic Control (2015)  Target Ranges:  Prepandial:   less than 140 mg/dL      Peak postprandial:   less than 180 mg/dL (1-2 hours)      Critically ill patients:  140 - 180 mg/dL   Lab Results  Component Value Date   GLUCAP 257 (H) 10/05/2019   HGBA1C 8.5 (H) 10/05/2019    Review of Glycemic Control Results for [name], [name] (MRN [id number]) as of 10/05/2019 15:14  Ref. Range 10/05/2019 03:02 10/05/2019 09:09 10/05/2019 12:28  Glucose-Capillary Latest Ref Range: 70 - 99 mg/dL 194 (H) 186 (H) 257 (H)   Diabetes history: DM 2 Outpatient Diabetes medications: Glucotrol 5 mg bid Current orders for Inpatient glycemic control:  Novolog sensitive tid with meals and HS  Inpatient Diabetes Program Recommendations:   Note that blood sugars slightly >goal.  If blood sugars continue to be >goal, Consider adding Levemir 5 units bid while patient is in the hospital.   Thanks,  Adah Perl, RN, BC-ADM Inpatient Diabetes Coordinator Pager [phone] (8a-5p)

## 2019-10-05 NOTE — Progress Notes (Addendum)
PROGRESS NOTE    [name]  ZES:[id number] DOB: 07-Sep-1927 DOA: 10/04/2019 PCP: Redmond School, MD   Brief Narrative:  [name] is a 86 y.o. male with medical history significant for atrial fibrillation, aortic stenosis, chronic diastolic heart failure, hypertension, diabetes mellitus type 2, peripheral vascular disease, s/p bilateral AKA and has bilateral prosthesis in place, hypothyroidism who presents EMS to the emergency room with complaint of chest pain.  Patient son is at bedside and helps provide history.  Patient returned home from rehab/PT appointment this afternoon around 2:00 pm and developed a substernal chest pressure and tightness in the upper chest.  States he was not doing any particular activity when the chest pressure began.  Reports he did not have chest pressure when he was at his physical therapy appointment earlier in the day.  After sitting and resting it continued to occur.  His son gave him a Pepcid for presumed indigestion but the pain did not improve.  He then developed left shoulder and left upper arm pain and so was brought to the emergency room for further evaluation.  He did not have any shortness of breath, nausea, vomiting or diaphoresis with the episode.  Reports that the pressure was an 8-9/10 at its worst and is currently solved.  He was given aspirin by EMS in route to the hospital.  Denies any complaints at this time. He has 1 son who lives with him during the week and his other children stay with him over the weekend he always has somebody at home with him. Reports he has been compliant with his medications. In ED: [name] has an elevated troponin level in the emergency room.  Repeat troponin shows an increase from the initial level around 100 to over 500.  His EKG shows no ST elevation.  He is found to have AKI superimposed on his chronic kidney disease.  ER provider discussed with cardiology who is going to see patient in the emergency room and provide  recommendations.  Hospitalist service is asked to evaluate and help manage patient    Assessment & Plan:   Principal Problem:   NSTEMI (non-ST elevated myocardial infarction) ([place]) Active Problems:   Essential hypertension, benign   Moderate aortic stenosis   Chronic diastolic CHF (congestive heart failure) (HCC)   Atrial fibrillation, chronic   Type 2 diabetes mellitus with vascular disease (HCC)   S/P BKA (below knee amputation) unilateral, right (HCC)   Acute kidney injury superimposed on CKD ([place])   Hypokalemia   NSTEMI (non-ST elevated myocardial infarction) ([place]), unspecified, POA - Cardiology continues to follow currently recommending heparin drip and carvedilol - Further evaluation and procedure pending discussion with family, current considerations for catheterization as well as TEE given severe aortic stenosis but these are limited in the setting of kidney disease -Continue statin therapy and close monitoring  Acute kidney injury superimposed on CKD 2 ([place]) - Patient with mild AKI -Defer to cardiology whether or not to continue IV fluids, patient appears euvolemic at this time -creatinine minimally downtrending appropriately  Essential hypertension - Continue patient's home dose of Norvasc.  We will add low-dose Coreg 3.125 mg twice   Hypokalemia  WNL - continue to follow  S/P BKA (below knee amputation) bilateral.  DVT prophylaxis: Heparin drip as above Code Status: Full Family Communication: Son at bedside  Status is: Patient  Dispo: The patient is from: Home              Anticipated d/c is  to: To be determined              Anticipated d/c date is: 48 to 72 hours pending clinical course              Patient currently not medically stable for discharge due to ongoing need for further evaluation and possible intervention with cardiology  Consultants:   Cardiology  Procedures:   None planned  Antimicrobials:  None indicated  Subjective: No  acute issues or events overnight, review systems somewhat limited patient denies overtly any chest pain, nausea, vomiting, diarrhea, constipation, headache, fevers, or chills.  Objective: Vitals:   10/05/19 0300 10/05/19 0330 10/05/19 0452 10/05/19 0651  BP: 118/81 116/82 137/85 (!) 120/92  Pulse: (!) 104 100  (!) 104  Resp: 13 13 20 14   Temp:   98.3 F (36.8 C) 97.6 F (36.4 C)  TempSrc:   Oral Oral  SpO2: 93% 92% 97% 96%  Weight:   94.2 kg   Height:   4\' 8"  (1.422 m)     Intake/Output Summary (Last 24 hours) at 10/05/2019 0759 Last data filed at 10/05/2019 0600 Gross per 24 hour  Intake 155.03 ml  Output --  Net 155.03 ml   Filed Weights   10/04/19 1702 10/05/19 0452  Weight: 100.4 kg 94.2 kg    Examination:  General:  Pleasantly resting in bed, No acute distress. HEENT:  Normocephalic atraumatic.  Sclerae nonicteric, noninjected.  Extraocular movements intact bilaterally. Neck:  Without mass or deformity.  Trachea is midline. Lungs:  Clear to auscultate bilaterally without rhonchi, wheeze, or rales. Heart: Irregularly irregular with blowing holosystolic murmur. Abdomen:  Soft, nontender, nondistended.  Without guarding or rebound. Extremities: Without cyanosis, clubbing, edema, or obvious deformity.  Bilateral lower extremity BKA Vascular:  Dorsalis pedis and posterior tibial pulses palpable bilaterally. Skin:  Warm and dry, no erythema, no rashes.   Data Reviewed: I have personally reviewed following labs and imaging studies  CBC: Recent Labs  Lab 10/04/19 1800 10/05/19 0333  WBC 10.9* 9.7  HGB 12.7* 13.2  HCT 38.1* 38.7*  MCV 94.1 94.6  PLT 222 427   Basic Metabolic Panel: Recent Labs  Lab 10/04/19 1800 10/04/19 1910 10/05/19 0333  NA 133*  --  135  K 3.0*  --  3.5  CL 94*  --  96*  CO2 26  --  26  GLUCOSE 229*  --  203*  BUN 39*  --  38*  CREATININE 2.72*  --  2.55*  CALCIUM 9.0  --  9.2  MG  --  2.5*  --    GFR: Estimated Creatinine  Clearance: 16.9 mL/min (A) (by C-G formula based on SCr of 2.55 mg/dL (H)). Liver Function Tests: Recent Labs  Lab 10/04/19 1800  AST 26  ALT 17  ALKPHOS 61  BILITOT 0.6  PROT 7.0  ALBUMIN 3.2*   No results for input(s): LIPASE, AMYLASE in the last 168 hours. No results for input(s): AMMONIA in the last 168 hours. Coagulation Profile: No results for input(s): INR, PROTIME in the last 168 hours. Cardiac Enzymes: No results for input(s): CKTOTAL, CKMB, CKMBINDEX, TROPONINI in the last 168 hours. BNP (last 3 results) No results for input(s): PROBNP in the last 8760 hours. HbA1C: Recent Labs    10/05/19 0333  HGBA1C 8.5*   CBG: Recent Labs  Lab 10/05/19 0302  GLUCAP 194*   Lipid Profile: No results for input(s): CHOL, HDL, LDLCALC, TRIG, CHOLHDL, LDLDIRECT in the last 72 hours. Thyroid  Function Tests: Recent Labs    10/05/19 0333  TSH 2.567   Anemia Panel: No results for input(s): VITAMINB12, FOLATE, FERRITIN, TIBC, IRON, RETICCTPCT in the last 72 hours. Sepsis Labs: No results for input(s): PROCALCITON, LATICACIDVEN in the last 168 hours.  Recent Results (from the past 240 hour(s))  SARS Coronavirus 2 by RT PCR (hospital order, performed in [facility] hospital lab) Nasopharyngeal Nasopharyngeal Swab     Status: None   Collection Time: 10/05/19 12:27 AM   Specimen: Nasopharyngeal Swab  Result Value Ref Range Status   SARS Coronavirus 2 NEGATIVE NEGATIVE Final    Comment: (NOTE) SARS-CoV-2 target nucleic acids are NOT DETECTED.  The SARS-CoV-2 RNA is generally detectable in upper and lower respiratory specimens during the acute phase of infection. The lowest concentration of SARS-CoV-2 viral copies this assay can detect is 250 copies / mL. A negative result does not preclude SARS-CoV-2 infection and should not be used as the sole basis for treatment or other patient management decisions.  A negative result may occur with improper specimen collection / handling,  submission of specimen other than nasopharyngeal swab, presence of viral mutation(s) within the areas targeted by this assay, and inadequate number of viral copies (<250 copies / mL). A negative result must be combined with clinical observations, patient history, and epidemiological information.  Fact Sheet for Patients:   StrictlyIdeas.no  Fact Sheet for Healthcare Providers: BankingDealers.co.za  This test is not yet approved or  cleared by the [place] FDA and has been authorized for detection and/or diagnosis of SARS-CoV-2 by FDA under an Emergency Use Authorization (EUA).  This EUA will remain in effect (meaning this test can be used) for the duration of the COVID-19 declaration under Section 564(b)(1) of the Act, 21 U.S.C. section 360bbb-3(b)(1), unless the authorization is terminated or revoked sooner.  Performed at [facility], [place] [address] [place], [place] [address]          Radiology Studies: DG Chest Port 1 View  Result Date: 10/04/2019 CLINICAL DATA:  Chest pain since 2 p.m. EXAM: PORTABLE CHEST 1 VIEW COMPARISON:  12/09/2018 FINDINGS: Single frontal view of the chest demonstrates an unremarkable cardiac silhouette. There is small residual left pleural effusion. No airspace disease or pneumothorax. No acute bony abnormalities. IMPRESSION: 1. Small residual left pleural effusion, markedly decreased since prior study. 2. No acute airspace disease. Electronically Signed   By: Randa Ngo M.D.   On: 10/04/2019 17:12        Scheduled Meds: . amLODipine  2.5 mg Oral Daily  . aspirin EC  81 mg Oral Daily  . atorvastatin  40 mg Oral Daily  . carvedilol  3.125 mg Oral BID WC  . Chlorhexidine Gluconate Cloth  6 each Topical Daily  . dorzolamide-timolol  1 drop Both Eyes BID  . finasteride  5 mg Oral QPM  . furosemide  40 mg Oral BID  . insulin aspart  0-5 Units Subcutaneous QHS  . insulin aspart  0-9  Units Subcutaneous TID WC  . levothyroxine  75 mcg Oral Daily  . potassium chloride  10 mEq Oral Daily  . tamsulosin  0.4 mg Oral BID   Continuous Infusions: . heparin 1,150 Units/hr (10/05/19 0139)  . lactated ringers 50 mL/hr at 10/05/19 0354     LOS: 0 days   Time spent: 60min  Istvan C Joan Herschberger, DO [facility]  If 7PM-7AM, please contact night-coverage [email or web address]  10/05/2019, 7:59 AM

## 2019-10-05 NOTE — ED Notes (Signed)
Report from [place], [place]

## 2019-10-05 NOTE — Progress Notes (Signed)
[place] for IV heparin Indication: chest pain/ACS  Assessment: 89yoM presenting with substernal chest pressure/tightness not relieved by rest. He developed shoulder and upper arm pain so he was brought to ED for evaluation. Denies SOB, N/V or diaphoresis. PMH s/f atrial fibrillation on apixaban, CHF, HTN and PVD s/p bilateral AKA. Reports compliance with medications.   APTT now 92 sec  Goal of Therapy:  Heparin level 0.3-0.7 units/ml aPTT 66-102 seconds Monitor platelets by anticoagulation protocol: Yes   Plan:  -Continue heparin 950 units/h -f/u am labs Thanks for allowing pharmacy to be a part of this patient's care.  Excell Seltzer, PharmD Clinical Pharmacist 10/05/2019

## 2019-10-05 NOTE — Therapy (Signed)
[facility] Physical Therapy [address] [place], [place], [address] Phone: [phone]   Fax:  [phone]  Patient Details  Name: [name] MRN: [id number] Date of Birth: 16-Sep-1927 Referring Provider:  [name], MD  Encounter Date: 10/05/2019  PHYSICAL THERAPY DISCHARGE SUMMARY  Visits from Start of Care: 53  Current functional level related to goals / functional outcomes: See PT recertification done on 09/22/2019   Remaining deficits: See last PT note on 10/04/2019   Education / Equipment: Prosthetic care & ongoing HEP.    Plan: Patient agrees to discharge.  Patient goals were not met. Patient is being discharged due to a change in medical status.  He was hospitalized on 10/04/2019 with STEMI.  Please refer back to PT once he is medically stable.            Jamey Reas, PT, DPT 10/05/2019, 2:48 PM  [facility] Physical Therapy [address] [place], [place], [address] Phone: [phone]   Fax:  [phone]

## 2019-10-05 NOTE — Patient Outreach (Signed)
[place] [facility]) Care Management  10/05/2019  [name] Oct 09, 1927 [id number]  Patient was admitted to the hospital 10/04/19. Nurse Health Coach will perform case closure and transfer patient to the [place] Team. Nurse has informed [facility] Coordinator of patient's admission.   Plan: RN Health Coach will close case and will send PCP closure letter.   Emelia Loron RN, BSN [place] [phone] [name]

## 2019-10-05 NOTE — Progress Notes (Signed)
[place] for IV heparin Indication: chest pain/ACS  Allergies  Allergen Reactions  . Iodinated Diagnostic Agents Other (See Comments)    caused Fever  . Dye Fdc Red [Red Dye] Hives    Tolerated red Docusate, Niferex    Patient Measurements: Height: 4\' 8"  (142.2 cm) Weight: 94.2 kg (207 lb 10.8 oz) IBW/kg (Calculated) : 40.8 Heparin Dosing Weight: 92kg  Vital Signs: Temp: 98.4 F (36.9 C) (09/21 1210) Temp Source: Oral (09/21 1210) BP: 138/97 (09/21 1210) Pulse Rate: 102 (09/21 1210)  Labs: Recent Labs    10/04/19 1800 10/04/19 1910 10/05/19 0333 10/05/19 1217  HGB 12.7*  --  13.2  --   HCT 38.1*  --  38.7*  --   PLT 222  --  211  --   APTT  --   --  81* 139*  HEPARINUNFRC  --   --  >2.20*  --   CREATININE 2.72*  --  2.55*  --   TROPONINIHS 272* 572*  --   --     Estimated Creatinine Clearance: 16.9 mL/min (A) (by C-G formula based on SCr of 2.55 mg/dL (H)).   Medical History: Past Medical History:  Diagnosis Date  . Arthritis   . Borderline diabetic   . CHF (congestive heart failure) ([place])   . Chronic kidney disease   . Diabetes mellitus without complication ([place])   . Dysrhythmia   . Glaucoma   . Hyperlipidemia   . Hypertension   . Hypothyroidism   . PVD (peripheral vascular disease) ([place])   . Sleep apnea    Cpap ordered but doesnt use  . Urinary retention due to benign prostatic hyperplasia 12/28/2018    Medications:  Infusions:  . heparin 1,150 Units/hr (10/05/19 0139)  . lactated ringers 50 mL/hr at 10/05/19 0354    Assessment: 89yoM presenting with substernal chest pressure/tightness not relieved by rest. He developed shoulder and upper arm pain so he was brought to ED for evaluation. Denies SOB, N/V or diaphoresis. PMH s/f atrial fibrillation on apixaban, CHF, HTN and PVD s/p bilateral AKA. Reports compliance with medications.   Initial aPTT above goal on 1150 units/h at 139 seconds.  Goal of  Therapy:  Heparin level 0.3-0.7 units/ml aPTT 66-102 seconds Monitor platelets by anticoagulation protocol: Yes   Plan:  -Hold heparin x30 then reduce infusion to 950 units/h -Recheck aPTT in 8h   Arrie Senate, PharmD, BCPS Clinical Pharmacist [phone] Please check AMION for all [place] numbers 10/05/2019

## 2019-10-06 ENCOUNTER — Encounter: Payer: Medicare Other | Admitting: Physical Therapy

## 2019-10-06 DIAGNOSIS — I5032 Chronic diastolic (congestive) heart failure: Secondary | ICD-10-CM

## 2019-10-06 LAB — BASIC METABOLIC PANEL
Anion gap: 8 (ref 5–15)
BUN: 42 mg/dL — ABNORMAL HIGH (ref 8–23)
CO2: 25 mmol/L (ref 22–32)
Calcium: 8.9 mg/dL (ref 8.9–10.3)
Chloride: 98 mmol/L (ref 98–111)
Creatinine, Ser: 2.67 mg/dL — ABNORMAL HIGH (ref 0.61–1.24)
GFR calc Af Amer: 23 mL/min — ABNORMAL LOW (ref >60)
GFR calc non Af Amer: 20 mL/min — ABNORMAL LOW (ref >60)
Glucose, Bld: 243 mg/dL — ABNORMAL HIGH (ref 70–99)
Potassium: 4 mmol/L (ref 3.5–5.1)
Sodium: 131 mmol/L — ABNORMAL LOW (ref 135–145)

## 2019-10-06 LAB — CBC
HCT: 34.1 % — ABNORMAL LOW (ref 39.0–52.0)
Hemoglobin: 11.6 g/dL — ABNORMAL LOW (ref 13.0–17.0)
MCH: 32.2 pg (ref 26.0–34.0)
MCHC: 34 g/dL (ref 30.0–36.0)
MCV: 94.7 fL (ref 80.0–100.0)
Platelets: 183 10*3/uL (ref 150–400)
RBC: 3.6 MIL/uL — ABNORMAL LOW (ref 4.22–5.81)
RDW: 14.6 % (ref 11.5–15.5)
WBC: 8.6 10*3/uL (ref 4.0–10.5)
nRBC: 0 % (ref 0.0–0.2)

## 2019-10-06 LAB — GLUCOSE, CAPILLARY
Glucose-Capillary: 210 mg/dL — ABNORMAL HIGH (ref 70–99)
Glucose-Capillary: 226 mg/dL — ABNORMAL HIGH (ref 70–99)
Glucose-Capillary: 235 mg/dL — ABNORMAL HIGH (ref 70–99)
Glucose-Capillary: 245 mg/dL — ABNORMAL HIGH (ref 70–99)

## 2019-10-06 MED ORDER — LATANOPROST 0.005 % OP SOLN
1.0000 [drp] | Freq: Every day | OPHTHALMIC | Status: DC
  Administered 2019-10-06 – 2019-10-10 (×5): 1 [drp] via OPHTHALMIC
  Filled 2019-10-06: qty 2.5

## 2019-10-06 MED ORDER — CLOPIDOGREL BISULFATE 75 MG PO TABS
75.0000 mg | ORAL_TABLET | Freq: Every day | ORAL | Status: DC
  Administered 2019-10-07 – 2019-10-11 (×5): 75 mg via ORAL
  Filled 2019-10-06 (×5): qty 1

## 2019-10-06 MED ORDER — CARVEDILOL 12.5 MG PO TABS
12.5000 mg | ORAL_TABLET | Freq: Two times a day (BID) | ORAL | Status: DC
  Administered 2019-10-06: 12.5 mg via ORAL
  Filled 2019-10-06: qty 1

## 2019-10-06 MED ORDER — APIXABAN 2.5 MG PO TABS
2.5000 mg | ORAL_TABLET | Freq: Two times a day (BID) | ORAL | Status: DC
  Administered 2019-10-06 – 2019-10-11 (×10): 2.5 mg via ORAL
  Filled 2019-10-06 (×10): qty 1

## 2019-10-06 NOTE — Progress Notes (Addendum)
Progress Note  Patient Name: [name] Date of Encounter: 10/06/2019  Primary Cardiologist: Pixie Casino, MD  Subjective   Patient denies any complaints. Oriented to hospital but does not know date, states he is just waking up.  Inpatient Medications    Scheduled Meds: . amLODipine  2.5 mg Oral Daily  . aspirin EC  81 mg Oral Daily  . atorvastatin  40 mg Oral Daily  . carvedilol  6.25 mg Oral BID WC  . Chlorhexidine Gluconate Cloth  6 each Topical Daily  . dorzolamide-timolol  1 drop Both Eyes BID  . finasteride  5 mg Oral QPM  . insulin aspart  0-5 Units Subcutaneous QHS  . insulin aspart  0-9 Units Subcutaneous TID WC  . levothyroxine  75 mcg Oral Daily  . potassium chloride  10 mEq Oral Daily  . tamsulosin  0.4 mg Oral BID   Continuous Infusions: . heparin 950 Units/hr (10/06/19 0400)  . lactated ringers 50 mL/hr at 10/06/19 0400   PRN Meds: acetaminophen   Vital Signs    Vitals:   10/05/19 2118 10/06/19 0037 10/06/19 0420 10/06/19 0747  BP: (!) 136/95 115/80 (!) 142/93 (!) 156/101  Pulse: 98 (!) 110 (!) 105 (!) 103  Resp: 18 17 18 16   Temp: 97.7 F (36.5 C) 97.7 F (36.5 C) 97.9 F (36.6 C) 98 F (36.7 C)  TempSrc: Oral Oral Oral Oral  SpO2: 97% 95% 94% 94%  Weight:   98.7 kg   Height:        Intake/Output Summary (Last 24 hours) at 10/06/2019 0759 Last data filed at 10/06/2019 0422 Gross per 24 hour  Intake 1454.32 ml  Output 1850 ml  Net -395.68 ml   Last 3 Weights 10/06/2019 10/05/2019 10/04/2019  Weight (lbs) 217 lb 9.5 oz 207 lb 10.8 oz 221 lb 6.4 oz  Weight (kg) 98.7 kg 94.2 kg 100.426 kg     Telemetry    Atrial fib rates 90s-100s - Personally Reviewed  Physical Exam   GEN: No acute distress.  HEENT: Normocephalic, atraumatic, sclera non-icteric. Neck: No JVD or bruits. Cardiac: Irregularly irregular, rate mildly elevated, 2/6 SEM heard over entire precordium, no rubs or gallops.  Respiratory: Clear to auscultation bilaterally.  Breathing is unlabored. GI: Soft, nontender, non-distended, BS +x 4. MS: s/p bilateral lower extremity amputations Extremities: No clubbing or cyanosis. Bilateral lower extremity amputations Neuro:  A+O to self, place but not date. Follows commands. Psych:  Pleasant affect but not very voluntarily conversant  Labs    High Sensitivity Troponin:   Recent Labs  Lab 10/04/19 1800 10/04/19 1910  TROPONINIHS 272* 572*      Cardiac EnzymesNo results for input(s): TROPONINI in the last 168 hours. No results for input(s): TROPIPOC in the last 168 hours.   Chemistry Recent Labs  Lab 10/04/19 1800 10/05/19 0333  NA 133* 135  K 3.0* 3.5  CL 94* 96*  CO2 26 26  GLUCOSE 229* 203*  BUN 39* 38*  CREATININE 2.72* 2.55*  CALCIUM 9.0 9.2  PROT 7.0  --   ALBUMIN 3.2*  --   AST 26  --   ALT 17  --   ALKPHOS 61  --   BILITOT 0.6  --   GFRNONAA 20* 21*  GFRAA 23* 25*  ANIONGAP 13 13     Hematology Recent Labs  Lab 10/04/19 1800 10/05/19 0333  WBC 10.9* 9.7  RBC 4.05* 4.09*  HGB 12.7* 13.2  HCT 38.1* 38.7*  MCV 94.1 94.6  MCH 31.4 32.3  MCHC 33.3 34.1  RDW 13.9 14.3  PLT 222 211    BNPNo results for input(s): BNP, PROBNP in the last 168 hours.   DDimer No results for input(s): DDIMER in the last 168 hours.   Radiology    DG Chest Port 1 View  Result Date: 10/04/2019 CLINICAL DATA:  Chest pain since 2 p.m. EXAM: PORTABLE CHEST 1 VIEW COMPARISON:  12/09/2018 FINDINGS: Single frontal view of the chest demonstrates an unremarkable cardiac silhouette. There is small residual left pleural effusion. No airspace disease or pneumothorax. No acute bony abnormalities. IMPRESSION: 1. Small residual left pleural effusion, markedly decreased since prior study. 2. No acute airspace disease. Electronically Signed   By: Randa Ngo M.D.   On: 10/04/2019 17:12    Cardiac Studies   2D Echo 09/04/19  1. Left ventricular ejection fraction, by estimation, is 55 to 60%. The  left  ventricle has normal function. The left ventricle has no regional  wall motion abnormalities. There is moderate left ventricular hypertrophy.  Left ventricular diastolic  parameters are consistent with Grade I diastolic dysfunction (impaired  relaxation). Elevated left atrial pressure.  2. Right ventricular systolic function is normal. The right ventricular  size is normal.  3. The mitral valve is normal in structure. No evidence of mitral valve  regurgitation. No evidence of mitral stenosis.  4. The aortic valve has an indeterminant number of cusps. Aortic valve  regurgitation is not visualized. Moderate to severe aortic valve  stenosis.Aortic valve mean gradient measures 27.4 mmHg. Aortic valve peak  gradient measures 44.6 mmHg. Aortic valve  area, by VTI measures 0.77 cm.   Patient Profile     86 y.o. male with history of HTN, HLD, PAD s/p bilateral amputation, chronic diastolic HF, moderate - severe AS, CKD stage III, paroxysmal atrial fibrillation on Eliquis, sleep apnea (does not use CPAP), DM, RBBB/bifascicular block, bradycardia admitted 10/04/19 with chest pain/"indigestion", found to have NSTEMI and AKI on CKD.   Assessment & Plan    1. NSTEMI - continue ASA, BB, statin, amlodipine - pt appears to have been on Plavix as OP as well, will await MD input regarding this (also on ASA/apixaban) - on IV heparin, pending further discussions about aggressiveness of care - given advanced age, debility, and worsening renal failure, patient seems to be poor candidate to jump right to invasive eval - consider repeat limited echo to f/u LVEF impact - per notes, son pending further discussion with patient who seems very passive in conversation today and does not specifically have an opinion on the matter  2. AKI on CKD stage III - peak Cr 2.72 on admit (baseline 1.3) - > 2.55 yesterday - pending labs today  3. Moderate-severe aortic stenosis by echocardiogram (08/2019) - saw structural  heart as outpatient but not clear to be a TAVR patient given bilateral BKAs - also further complicating the issue is new renal insufficiency which complicates the concept of obtaining pre-operative CT angiograms and cardiac catheterization  4. Chronic diastolic CHF - Lasix on hold due to AKI, received IVF this admission, weight similar today compared to 08/2019 OV and BMET pending - continue to monitor  5. PAF - was in sinus at last visit, now in afib with mildly elevated rates which may be contributing to symptoms and decline in context of AS - will discuss plan for rate/rhythm control with MD - BB increased yesterday but notably patient also has history of bradycardia as  well so need to be cautious when finding balance. - Eliquis on hold currently pending decision for aggressiveness of workup (on heparin)  6. Essential HTN - BP labile 014->840B systolic, continue to manage in context of the above - beta blocker just increased last night  7. Uncontrolled DM - A1C 8.5 -> per IM  For questions or updates, please contact [place] Please consult [email or web address] for contact info under Cardiology/STEMI.  Signed, Charlie Pitter, PA-C 10/06/2019, 7:59 AM

## 2019-10-06 NOTE — Progress Notes (Signed)
  Progress Note   Date: 10/06/2019  Patient Name: [name]        MRN#: [id number]  Clarification of the diagnosis of diabetes:   Poorly controlled diabetes

## 2019-10-06 NOTE — Progress Notes (Addendum)
PROGRESS NOTE    [name]  XLK:[id number] DOB: Nov 17, 1927 DOA: 10/04/2019 PCP: Redmond School, MD   Brief Narrative:  [name] is a 86 y.o. male with medical history significant for atrial fibrillation, aortic stenosis, chronic diastolic heart failure, hypertension, diabetes mellitus type 2, peripheral vascular disease, s/p bilateral AKA and has bilateral prosthesis in place, hypothyroidism who presents EMS to the emergency room with complaint of chest pain.  Patient son is at bedside and helps provide history.  Patient returned home from rehab/PT appointment this afternoon around 2:00 pm and developed a substernal chest pressure and tightness in the upper chest.  States he was not doing any particular activity when the chest pressure began.  Reports he did not have chest pressure when he was at his physical therapy appointment earlier in the day.  After sitting and resting it continued to occur.  His son gave him a Pepcid for presumed indigestion but the pain did not improve.  He then developed left shoulder and left upper arm pain and so was brought to the emergency room for further evaluation.  He did not have any shortness of breath, nausea, vomiting or diaphoresis with the episode.  Reports that the pressure was an 8-9/10 at its worst and is currently solved.  He was given aspirin by EMS in route to the hospital.  Denies any complaints at this time. He has 1 son who lives with him during the week and his other children stay with him over the weekend he always has somebody at home with him. Reports he has been compliant with his medications. In ED: Mr. Favila has an elevated troponin level in the emergency room.  Repeat troponin shows an increase from the initial level around 100 to over 500.  His EKG shows no ST elevation.  He is found to have AKI superimposed on his chronic kidney disease.  ER provider discussed with cardiology who is going to see patient in the emergency room and provide  recommendations.  Hospitalist service is asked to evaluate and help manage patient    Assessment & Plan:   Principal Problem:   NSTEMI (non-ST elevated myocardial infarction) ([place]) Active Problems:   Essential hypertension, benign   Moderate aortic stenosis   Chronic diastolic CHF (congestive heart failure) (HCC)   Atrial fibrillation, chronic   Type 2 diabetes mellitus with vascular disease (HCC)   S/P BKA (below knee amputation) unilateral, right (HCC)   Acute kidney injury superimposed on CKD ([place])   Hypokalemia   NSTEMI (non-ST elevated myocardial infarction) ([place]), unspecified, POA - Cardiology continues to follow -appreciate insight and recommendations - Heparin drip to stop, resume Eliquis and Plavix, discontinue aspirin - Lengthy discussion today at bedside with cardiology patient and his son that medical management would likely be in the best interest given patient's minimal symptoms, essentially nonambulatory status very minimal exertion and risk given age and comorbidities as below -What limited in medical therapy given patient's aortic stenosis, CKD which limits nitrates and Ranexa.  Continue to titrate beta-blocker along with cardiology -Continue statin therapy and close monitoring  Acute kidney injury superimposed on CKD 2 ([place]) -Creatinine previously downtrending appropriately -still no labs for today -Encouraged increased p.o. intake of free water  Essential hypertension - Continue patient's home dose of Norvasc.  We will add low-dose Coreg 3.125 mg twice   Hypokalemia  WNL -no labs thus far today  S/P BKA (below knee amputation) bilateral.  DVT prophylaxis: Transition from heparin drip to Eliquis  as above Code Status: Full Family Communication: Son at bedside  Status is: Patient  Dispo: The patient is from: Home              Anticipated d/c is to: To be determined              Anticipated d/c date is: 24-48 hours pending clinical course               Patient currently not medically stable for discharge due to ongoing need for further evaluation and possible intervention with cardiology  Consultants:   Cardiology  Procedures:   None planned  Antimicrobials:  None indicated  Subjective: No acute issues or events overnight, review systems somewhat limited patient denies overtly any chest pain, nausea, vomiting, diarrhea, constipation, headache, fevers, or chills.  Objective: Vitals:   10/05/19 2118 10/06/19 0037 10/06/19 0420 10/06/19 0747  BP: (!) 136/95 115/80 (!) 142/93 (!) 156/101  Pulse: 98 (!) 110 (!) 105 (!) 103  Resp: 18 17 18 16   Temp: 97.7 F (36.5 C) 97.7 F (36.5 C) 97.9 F (36.6 C) 98 F (36.7 C)  TempSrc: Oral Oral Oral Oral  SpO2: 97% 95% 94% 94%  Weight:   98.7 kg   Height:        Intake/Output Summary (Last 24 hours) at 10/06/2019 0758 Last data filed at 10/06/2019 0422 Gross per 24 hour  Intake 1454.32 ml  Output 1850 ml  Net -395.68 ml   Filed Weights   10/04/19 1702 10/05/19 0452 10/06/19 0420  Weight: 100.4 kg 94.2 kg 98.7 kg    Examination:  General:  Pleasantly resting in bed, No acute distress. HEENT:  Normocephalic atraumatic.  Sclerae nonicteric, noninjected.  Extraocular movements intact bilaterally. Neck:  Without mass or deformity.  Trachea is midline. Lungs:  Clear to auscultate bilaterally without rhonchi, wheeze, or rales. Heart: Irregularly irregular with blowing holosystolic murmur Abdomen:  Soft, nontender, nondistended.  Without guarding or rebound. Extremities: Without cyanosis, clubbing, edema, or obvious deformity.  Bilateral lower extremity BKA Vascular:  Dorsalis pedis and posterior tibial pulses palpable bilaterally. Skin:  Warm and dry, no erythema, no rashes.   Data Reviewed: I have personally reviewed following labs and imaging studies  CBC: Recent Labs  Lab 10/04/19 1800 10/05/19 0333  WBC 10.9* 9.7  HGB 12.7* 13.2  HCT 38.1* 38.7*  MCV 94.1 94.6  PLT  222 109   Basic Metabolic Panel: Recent Labs  Lab 10/04/19 1800 10/04/19 1910 10/05/19 0333  NA 133*  --  135  K 3.0*  --  3.5  CL 94*  --  96*  CO2 26  --  26  GLUCOSE 229*  --  203*  BUN 39*  --  38*  CREATININE 2.72*  --  2.55*  CALCIUM 9.0  --  9.2  MG  --  2.5*  --    GFR: Estimated Creatinine Clearance: 17.4 mL/min (A) (by C-G formula based on SCr of 2.55 mg/dL (H)). Liver Function Tests: Recent Labs  Lab 10/04/19 1800  AST 26  ALT 17  ALKPHOS 61  BILITOT 0.6  PROT 7.0  ALBUMIN 3.2*   No results for input(s): LIPASE, AMYLASE in the last 168 hours. No results for input(s): AMMONIA in the last 168 hours. Coagulation Profile: No results for input(s): INR, PROTIME in the last 168 hours. Cardiac Enzymes: No results for input(s): CKTOTAL, CKMB, CKMBINDEX, TROPONINI in the last 168 hours. BNP (last 3 results) No results for input(s): PROBNP in the  last 8760 hours. HbA1C: Recent Labs    10/05/19 0333  HGBA1C 8.5*   CBG: Recent Labs  Lab 10/05/19 0909 10/05/19 1228 10/05/19 1754 10/05/19 2149 10/06/19 0743  GLUCAP 186* 257* 183* 233* 210*   Lipid Profile: No results for input(s): CHOL, HDL, LDLCALC, TRIG, CHOLHDL, LDLDIRECT in the last 72 hours. Thyroid Function Tests: Recent Labs    10/05/19 0333  TSH 2.567   Anemia Panel: No results for input(s): VITAMINB12, FOLATE, FERRITIN, TIBC, IRON, RETICCTPCT in the last 72 hours. Sepsis Labs: No results for input(s): PROCALCITON, LATICACIDVEN in the last 168 hours.  Recent Results (from the past 240 hour(s))  SARS Coronavirus 2 by RT PCR (hospital order, performed in [facility] hospital lab) Nasopharyngeal Nasopharyngeal Swab     Status: None   Collection Time: 10/05/19 12:27 AM   Specimen: Nasopharyngeal Swab  Result Value Ref Range Status   SARS Coronavirus 2 NEGATIVE NEGATIVE Final    Comment: (NOTE) SARS-CoV-2 target nucleic acids are NOT DETECTED.  The SARS-CoV-2 RNA is generally detectable in  upper and lower respiratory specimens during the acute phase of infection. The lowest concentration of SARS-CoV-2 viral copies this assay can detect is 250 copies / mL. A negative result does not preclude SARS-CoV-2 infection and should not be used as the sole basis for treatment or other patient management decisions.  A negative result may occur with improper specimen collection / handling, submission of specimen other than nasopharyngeal swab, presence of viral mutation(s) within the areas targeted by this assay, and inadequate number of viral copies (<250 copies / mL). A negative result must be combined with clinical observations, patient history, and epidemiological information.  Fact Sheet for Patients:   StrictlyIdeas.no  Fact Sheet for Healthcare Providers: BankingDealers.co.za  This test is not yet approved or  cleared by the [place] FDA and has been authorized for detection and/or diagnosis of SARS-CoV-2 by FDA under an Emergency Use Authorization (EUA).  This EUA will remain in effect (meaning this test can be used) for the duration of the COVID-19 declaration under Section 564(b)(1) of the Act, 21 U.S.C. section 360bbb-3(b)(1), unless the authorization is terminated or revoked sooner.  Performed at [facility], [place] [address], [place], [place] [address]          Radiology Studies: DG Chest Port 1 View  Result Date: 10/04/2019 CLINICAL DATA:  Chest pain since 2 p.m. EXAM: PORTABLE CHEST 1 VIEW COMPARISON:  12/09/2018 FINDINGS: Single frontal view of the chest demonstrates an unremarkable cardiac silhouette. There is small residual left pleural effusion. No airspace disease or pneumothorax. No acute bony abnormalities. IMPRESSION: 1. Small residual left pleural effusion, markedly decreased since prior study. 2. No acute airspace disease. Electronically Signed   By: Randa Ngo M.D.   On: 10/04/2019 17:12         Scheduled Meds:  amLODipine  2.5 mg Oral Daily   aspirin EC  81 mg Oral Daily   atorvastatin  40 mg Oral Daily   carvedilol  6.25 mg Oral BID WC   Chlorhexidine Gluconate Cloth  6 each Topical Daily   dorzolamide-timolol  1 drop Both Eyes BID   finasteride  5 mg Oral QPM   insulin aspart  0-5 Units Subcutaneous QHS   insulin aspart  0-9 Units Subcutaneous TID WC   levothyroxine  75 mcg Oral Daily   potassium chloride  10 mEq Oral Daily   tamsulosin  0.4 mg Oral BID   Continuous Infusions:  heparin 950 Units/hr (10/06/19 0400)   lactated ringers 50 mL/hr at 10/06/19 0400     LOS: 1 day   Time spent: 110min  Mukund C Lenna Hagarty, DO [facility]  If 7PM-7AM, please contact night-coverage [email or web address]  10/06/2019, 7:58 AM

## 2019-10-06 NOTE — TOC Initial Note (Signed)
Transition of Care [facility]) - Initial/Assessment Note    Patient Details  Name: [name] MRN: [id number] Date of Birth: 03-28-1927  Transition of [facility]) CM/SW Contact:    Hyman Hopes, RN Phone Number: 10/06/2019, 3:10 PM  Clinical Narrative:  High risk readmission assessment completed.  Case manager went to bedside to speak with patient and his son.  Family is very supportive with son staying with patient during the week and his other children staying with him over the weekends for 24 hour support.  Patient goes to the [facility] for physical therapy.  No current orders for physical therapy but CM requested order for PT evaluation from physician.  Patient's son says that they have all equipment at home including rolling walker, 3 in 1 bedside commode.  Family takes patient to doctor's visits, able to obtain and afford medications.                 Expected Discharge Plan: [place] Barriers to Discharge: No Barriers Identified   Patient Goals and CMS Choice Patient states their goals for this hospitalization and ongoing recovery are:: to go home with family      Expected Discharge Plan and Services Expected Discharge Plan: [place]   Discharge Planning Services: CM Consult   Living arrangements for the past 2 months: Single Family Home                                      Prior Living Arrangements/Services Living arrangements for the past 2 months: Single Family Home Lives with:: Self, Adult Children Patient language and need for interpreter reviewed:: Yes Do you feel safe going back to the place where you live?: Yes      Need for Family Participation in Patient Care: Yes (Comment) Care giver support system in place?: Yes (comment) Current home services: Home PT Criminal Activity/Legal Involvement Pertinent to Current Situation/Hospitalization: No - Comment as needed  Activities of Daily Living Home Assistive  Devices/Equipment: CBG Meter, Dentures (specify type), Hearing aid, Prosthesis, Wheelchair ADL Screening (condition at time of admission) Patient's cognitive ability adequate to safely complete daily activities?: Yes Is the patient deaf or have difficulty hearing?: Yes Does the patient have difficulty seeing, even when wearing glasses/contacts?: No Does the patient have difficulty concentrating, remembering, or making decisions?: Yes Patient able to express need for assistance with ADLs?: Yes Does the patient have difficulty dressing or bathing?: Yes Independently performs ADLs?: No Communication: Independent Dressing (OT): Needs assistance Is this a change from baseline?: Pre-admission baseline Grooming: Independent Feeding: Independent Bathing: Needs assistance Is this a change from baseline?: Pre-admission baseline Toileting: Needs assistance Is this a change from baseline?: Pre-admission baseline In/Out Bed: Needs assistance Is this a change from baseline?: Pre-admission baseline Walks in Home: Needs assistance Is this a change from baseline?: Pre-admission baseline Does the patient have difficulty walking or climbing stairs?: Yes Weakness of Legs: None Weakness of Arms/Hands: None  Permission Sought/Granted Permission sought to share information with : Case Manager Permission granted to share information with : Yes, Verbal Permission Granted              Emotional Assessment Appearance:: Appears stated age Attitude/Demeanor/Rapport: Engaged Affect (typically observed): Appropriate Orientation: : Oriented to Self, Oriented to Place, Oriented to  Time, Oriented to Situation Alcohol / Substance Use: Never Used Psych Involvement: No (comment)  Admission diagnosis:  Hypokalemia [E87.6] Precordial chest pain [R07.2] Elevated troponin [R77.8] NSTEMI (non-ST elevated myocardial infarction) (HCC) [I21.4] AKI (acute kidney injury) ([place]) [N17.9] Aortic valve stenosis,  etiology of cardiac valve disease unspecified [I35.0] Patient Active Problem List   Diagnosis Date Noted  . NSTEMI (non-ST elevated myocardial infarction) ([place]) 10/04/2019  . Acute kidney injury superimposed on CKD ([place]) 10/04/2019  . Hypokalemia 10/04/2019  . Weakness generalized 04/05/2019  . S/P BKA (below knee amputation) unilateral, left ([place]) 02/15/2019  . Pressure injury of skin 02/03/2019  . TIA (transient ischemic attack) 02/03/2019  . Transient speech disturbance 02/02/2019  . Chronic indwelling Foley catheter 01/22/2019  . Postoperative wound infection 01/20/2019  . Normocytic anemia 01/18/2019  . Cellulitis 01/18/2019  . Infection of deep incisional surgical site after procedure   . Abnormal urinalysis   . Labile blood glucose   . Urinary retention   . S/P bilateral BKA (below knee amputation) ([place])   . Acute blood loss anemia   . Urinary retention due to benign prostatic hyperplasia 12/28/2018  . Unilateral complete BKA, left, initial encounter ([place]) 12/25/2018  . Dehiscence of amputation stump ([place])   . History of transmetatarsal amputation of left foot ([place]) 12/09/2018  . Critical limb ischemia with history of revascularization of same extremity   . Gangrene of left foot ([place])   . Diabetic ulcer of toe of left foot associated with type 2 diabetes mellitus ([place])   . Cellulitis of left lower extremity   . Toe infection 11/18/2018  . Peripheral edema   . Acute on chronic diastolic CHF (congestive heart failure) ([place]) 11/09/2018  . S/P BKA (below knee amputation) unilateral, right ([place]) 10/05/2018  . Hyponatremia   . Essential hypertension   . Postoperative pain   . Pain of left heel   . Unable to maintain weight-bearing   . Type 2 diabetes mellitus with diabetic peripheral angiopathy with gangrene ([place])   . Anemia due to acute blood loss   . Chronic kidney disease, stage 3a   . Acquired absence of right leg below knee ([place]) 09/07/2018  . Gangrene of right foot  ([place])   . DNR (do not resuscitate) discussion   . Goals of care, counseling/discussion   . Palliative care by specialist   . Type 2 diabetes mellitus with vascular disease ([place])   . Severe protein-calorie malnutrition ([place])   . Ischemic foot 08/20/2018  . Critical lower limb ischemia 08/03/2018  . Type 2 diabetes mellitus with foot ulcer, without long-term current use of insulin ([place])   . Gastroesophageal reflux disease   . Cellulitis of great toe of right foot 05/29/2018  . Atrial fibrillation, chronic   . Chest pain 07/25/2017  . PAF (paroxysmal atrial fibrillation) ([place]) 07/25/2017  . BPH (benign prostatic hyperplasia) 07/25/2017  . Shortness of breath 05/11/2017  . Hypothyroidism 05/11/2017  . Chronic diastolic CHF (congestive heart failure) ([place]) 05/11/2017  . Moderate aortic stenosis 06/12/2016  . RBBB 06/12/2016  . Peripheral vascular disease ([place]) 08/04/2012  . Essential hypertension, benign 08/04/2012  . Hyperlipidemia 08/04/2012  . Dizziness 08/04/2012   PCP:  Redmond School, MD Pharmacy:   [facility] [place], [place] [facility] [place] [address] [place] [address] Phone: [phone] Fax: [phone]     Social Determinants of Health (SDOH) Interventions    Readmission Risk Interventions Readmission Risk Prevention Plan 02/04/2019 12/22/2018 11/26/2018  Transportation Screening Complete Complete Complete  PCP or Specialist  Appt within 3-5 Days - - -  Not Complete comments - - -  HRI or [place] or Home Care Consult comments - - -  Social Work Consult for [place] Planning/Counseling - - -  Palliative Care Screening - - -  Medication Review Press photographer) Complete Complete Complete  PCP or Specialist appointment within 3-5 days of discharge Complete Complete Complete  HRI or [place] Complete Complete Complete  SW Recovery Care/Counseling Consult Complete  Complete Complete  Palliative Care Screening Not Applicable Not Applicable Not [place] Not Applicable Patient Refused Not Applicable  Some recent data might be hidden

## 2019-10-07 ENCOUNTER — Inpatient Hospital Stay (HOSPITAL_COMMUNITY): Payer: Medicare Other

## 2019-10-07 LAB — CBC
HCT: 33.5 % — ABNORMAL LOW (ref 39.0–52.0)
Hemoglobin: 11.2 g/dL — ABNORMAL LOW (ref 13.0–17.0)
MCH: 32 pg (ref 26.0–34.0)
MCHC: 33.4 g/dL (ref 30.0–36.0)
MCV: 95.7 fL (ref 80.0–100.0)
Platelets: 186 10*3/uL (ref 150–400)
RBC: 3.5 MIL/uL — ABNORMAL LOW (ref 4.22–5.81)
RDW: 14.6 % (ref 11.5–15.5)
WBC: 10.3 10*3/uL (ref 4.0–10.5)
nRBC: 0 % (ref 0.0–0.2)

## 2019-10-07 LAB — BASIC METABOLIC PANEL
Anion gap: 13 (ref 5–15)
BUN: 46 mg/dL — ABNORMAL HIGH (ref 8–23)
CO2: 24 mmol/L (ref 22–32)
Calcium: 8.9 mg/dL (ref 8.9–10.3)
Chloride: 96 mmol/L — ABNORMAL LOW (ref 98–111)
Creatinine, Ser: 2.73 mg/dL — ABNORMAL HIGH (ref 0.61–1.24)
GFR calc Af Amer: 23 mL/min — ABNORMAL LOW (ref >60)
GFR calc non Af Amer: 20 mL/min — ABNORMAL LOW (ref >60)
Glucose, Bld: 208 mg/dL — ABNORMAL HIGH (ref 70–99)
Potassium: 4.4 mmol/L (ref 3.5–5.1)
Sodium: 133 mmol/L — ABNORMAL LOW (ref 135–145)

## 2019-10-07 LAB — GLUCOSE, CAPILLARY
Glucose-Capillary: 176 mg/dL — ABNORMAL HIGH (ref 70–99)
Glucose-Capillary: 232 mg/dL — ABNORMAL HIGH (ref 70–99)
Glucose-Capillary: 208 mg/dL — ABNORMAL HIGH (ref 70–99)
Glucose-Capillary: 235 mg/dL — ABNORMAL HIGH (ref 70–99)

## 2019-10-07 MED ORDER — FUROSEMIDE 40 MG PO TABS
40.0000 mg | ORAL_TABLET | Freq: Every day | ORAL | Status: DC
  Administered 2019-10-07 – 2019-10-09 (×3): 40 mg via ORAL
  Filled 2019-10-07 (×3): qty 1

## 2019-10-07 MED ORDER — CARVEDILOL 6.25 MG PO TABS
6.2500 mg | ORAL_TABLET | Freq: Two times a day (BID) | ORAL | Status: DC
  Administered 2019-10-07 – 2019-10-11 (×8): 6.25 mg via ORAL
  Filled 2019-10-07 (×9): qty 1

## 2019-10-07 NOTE — Progress Notes (Signed)
Progress Note  Patient Name: [name] Date of Encounter: 10/07/2019  Primary Cardiologist: Pixie Casino, MD  Subjective   Denies chest pain. He's unsure if he is having any trouble breathing. Feeling "fine."  Inpatient Medications    Scheduled Meds: . amLODipine  2.5 mg Oral Daily  . apixaban  2.5 mg Oral BID  . atorvastatin  40 mg Oral Daily  . carvedilol  12.5 mg Oral BID WC  . Chlorhexidine Gluconate Cloth  6 each Topical Daily  . clopidogrel  75 mg Oral Daily  . finasteride  5 mg Oral QPM  . insulin aspart  0-5 Units Subcutaneous QHS  . insulin aspart  0-9 Units Subcutaneous TID WC  . latanoprost  1 drop Both Eyes QHS  . levothyroxine  75 mcg Oral Daily  . potassium chloride  10 mEq Oral Daily  . tamsulosin  0.4 mg Oral BID   Continuous Infusions:  PRN Meds: acetaminophen   Vital Signs    Vitals:   10/06/19 1603 10/06/19 2103 10/07/19 0616 10/07/19 0756  BP: 130/88 (!) 124/94 (!) 143/74   Pulse: 88 69 (!) 59 (!) 57  Resp: 16 18 16 20   Temp: 97.6 F (36.4 C) (!) 97.2 F (36.2 C) 97.8 F (36.6 C)   TempSrc: Oral Oral Oral   SpO2: 95% 93% 93% 92%  Weight:   99.1 kg   Height:        Intake/Output Summary (Last 24 hours) at 10/07/2019 0817 Last data filed at 10/07/2019 0600 Gross per 24 hour  Intake 1791.03 ml  Output 1250 ml  Net 541.03 ml   Last 3 Weights 10/07/2019 10/06/2019 10/05/2019  Weight (lbs) 218 lb 7.6 oz 217 lb 9.5 oz 207 lb 10.8 oz  Weight (kg) 99.1 kg 98.7 kg 94.2 kg     Telemetry    Converted from Afib to junctional brady with 4.3sec post termination pause around 23:41 then sinus brady thereafter - Personally Reviewed  ECG    SB 54bpm 1st degree AVB, PACs, RBBB, diffuse TW changes - Personally Reviewed  Physical Exam   GEN: No acute distress.  HEENT: Normocephalic, atraumatic, sclera non-icteric. Neck: No JVD or bruits. Cardiac: RRR no murmurs, rubs, or gallops.  Respiratory: Clear to auscultation bilaterally. Breathing  is unlabored. GI: Soft, nontender, non-distended, BS +x 4. MS: S/p bilateral lower extremity amputation Extremities: No clubbing or cyanosis. S/p bilateral lower extremity amputation Neuro:  A+O to self, place, does not know date. Follows commands. Psych: Flat affect  Labs    High Sensitivity Troponin:   Recent Labs  Lab 10/04/19 1800 10/04/19 1910  TROPONINIHS 272* 572*      Cardiac EnzymesNo results for input(s): TROPONINI in the last 168 hours. No results for input(s): TROPIPOC in the last 168 hours.   Chemistry Recent Labs  Lab 10/04/19 1800 10/04/19 1800 10/05/19 0333 10/06/19 1430 10/07/19 0416  NA 133*   < > 135 131* 133*  K 3.0*   < > 3.5 4.0 4.4  CL 94*   < > 96* 98 96*  CO2 26   < > 26 25 24   GLUCOSE 229*   < > 203* 243* 208*  BUN 39*   < > 38* 42* 46*  CREATININE 2.72*   < > 2.55* 2.67* 2.73*  CALCIUM 9.0   < > 9.2 8.9 8.9  PROT 7.0  --   --   --   --   ALBUMIN 3.2*  --   --   --   --  AST 26  --   --   --   --   ALT 17  --   --   --   --   ALKPHOS 61  --   --   --   --   BILITOT 0.6  --   --   --   --   GFRNONAA 20*   < > 21* 20* 20*  GFRAA 23*   < > 25* 23* 23*  ANIONGAP 13   < > 13 8 13    < > = values in this interval not displayed.     Hematology Recent Labs  Lab 10/05/19 0333 10/06/19 1430 10/07/19 0416  WBC 9.7 8.6 10.3  RBC 4.09* 3.60* 3.50*  HGB 13.2 11.6* 11.2*  HCT 38.7* 34.1* 33.5*  MCV 94.6 94.7 95.7  MCH 32.3 32.2 32.0  MCHC 34.1 34.0 33.4  RDW 14.3 14.6 14.6  PLT 211 183 186    BNPNo results for input(s): BNP, PROBNP in the last 168 hours.   DDimer No results for input(s): DDIMER in the last 168 hours.   Radiology    No results found.  Cardiac Studies   2D Echo 09/04/19  1. Left ventricular ejection fraction, by estimation, is 55 to 60%. The  left ventricle has normal function. The left ventricle has no regional  wall motion abnormalities. There is moderate left ventricular hypertrophy.  Left ventricular  diastolic  parameters are consistent with Grade I diastolic dysfunction (impaired  relaxation). Elevated left atrial pressure.  2. Right ventricular systolic function is normal. The right ventricular  size is normal.  3. The mitral valve is normal in structure. No evidence of mitral valve  regurgitation. No evidence of mitral stenosis.  4. The aortic valve has an indeterminant number of cusps. Aortic valve  regurgitation is not visualized. Moderate to severe aortic valve  stenosis.Aortic valve mean gradient measures 27.4 mmHg. Aortic valve peak  gradient measures 44.6 mmHg. Aortic valve  area, by VTI measures 0.77 cm.    Patient Profile     86 y.o. male with history of HTN, HLD, PAD s/p bilateral amputation, chronic diastolic HF, moderate - severe AS, CKD stage III, paroxysmal atrial fibrillation on Eliquis, sleep apnea (does not use CPAP), DM, RBBB/bifascicular block, bradycardiaadmitted 10/04/19 with chest pain/"indigestion," found to have NSTEMI and AKI on CKD.  Assessment & Plan    1. NSTEMI - case discussed with interventional team per notes - given complexity, comorbidities and kidney failure, medical therapy recommended since he is high risk for invasive evaluation - aspirin discontinued given concomitant Eliquis - continue Plavix - continue BB, statin - do not see plans for echocardiogram this admission since it would not acutely change management   2. AKI on CKD stage III - prior baseline 1.3, with admit Cr 2.7 and remaining similar - consider nephrology evaluation  - avoid nephrotoxic agents such as ACEI/ARB/ARNI/spironolactone - stop KCL since he is not on diuretics  3. Moderate-severe aortic stenosis by echocardiogram (08/2019) - plan medical therapy for now as he is not a candidate for the workup involved in pre-TAVR planning such as cath and CTs due to worsening renal failure  4. Chronic diastolic CHF - Lasix on hold due to AKI, received IVF this admission,  weight similar today compared to 08/2019 OV but with borderline O2 sats at times - will review plan with MD  5. PAF - in AF on admission which could be contributing to symptoms, converted to NSR with 4.3sec post-termination  pause then junctional brady followed by sinus brady, currently maintaining NSR with normal rates - asymptomatic with pause therefore no role for PPM at this time - will discuss whether there is any role for amiodarone to help maintain NSR - will hold beta blocker this AM pending review of tele with MD  - Eliquis resumed cautiously with kidney issues  6. Essential HTN - BP range OK  7. Poorly controlled DM - A1C 8.5 -> per IM  For questions or updates, please contact [place] Please consult [email or web address] for contact info under Cardiology/STEMI.  Signed, Charlie Pitter, PA-C 10/07/2019, 8:17 AM

## 2019-10-07 NOTE — Progress Notes (Signed)
Pt had a 4.3 sec pause and the converted to SB with 1st degree HB. EKG done to confirm and hospitalist notified.

## 2019-10-07 NOTE — Consult Note (Signed)
[facility] Renal Consultation Note  Requesting MD: Holli Humbles, MD Indication for Consultation:  AKI   Chief complaint: chest pain  HPI: [name] is a 86 y.o. male with a history of CKD, chronic diastolic CHF, diabetes, hypertension, OSA, and urinary retention secondary to BPH per charting who presented to the hospital with chest pain via EMS.  Nephrology is consulted for assistance with management of AKI.  He was evaluated by cardiology and felt not an ideal candidate for cath at this point given his acute renal failure.  He was initiated on heparin.  He was also noted to have moderate to severe aortic stenosis.  Baseline creatinine appears 1.2 -1.4.  Home medications include Flomax, finasteride, and Lasix-he takes Lasix 40 mg in the morning and 20 mg in the afternoon daily.  He got a dose of PO lasix today.  covid screen negative.  He was given LR at 69ml/hr which was stopped this AM.  He has had a foley at home per his son.  His children rotate providing in-home care for him.  Per his son, his heart converted from afib to NSR overnight. Pt and son deny NSAID use.   Creatinine, Ser  Date/Time Value Ref Range Status  10/07/2019 04:16 AM 2.73 (H) 0.61 - 1.24 mg/dL Final  10/06/2019 02:30 PM 2.67 (H) 0.61 - 1.24 mg/dL Final  10/05/2019 03:33 AM 2.55 (H) 0.61 - 1.24 mg/dL Final  10/04/2019 06:00 PM 2.72 (H) 0.61 - 1.24 mg/dL Final  02/04/2019 06:20 AM 1.17 0.61 - 1.24 mg/dL Final  02/03/2019 04:42 AM 1.33 (H) 0.61 - 1.24 mg/dL Final  02/02/2019 07:33 PM 1.30 (H) 0.61 - 1.24 mg/dL Final  02/02/2019 07:26 PM 1.16 0.61 - 1.24 mg/dL Final  01/22/2019 01:42 AM 1.25 (H) 0.61 - 1.24 mg/dL Final  01/20/2019 03:35 AM 1.26 (H) 0.61 - 1.24 mg/dL Final  01/19/2019 06:50 AM 1.12 0.61 - 1.24 mg/dL Final  01/18/2019 06:23 AM 1.29 (H) 0.61 - 1.24 mg/dL Final  01/17/2019 04:32 PM 1.34 (H) 0.61 - 1.24 mg/dL Final  01/04/2019 05:56 AM 1.30 (H) 0.61 - 1.24 mg/dL Final  12/30/2018  05:18 AM 1.15 0.61 - 1.24 mg/dL Final  12/28/2018 05:58 AM 1.22 0.61 - 1.24 mg/dL Final  12/26/2018 05:39 AM 1.21 0.61 - 1.24 mg/dL Final  12/21/2018 04:37 AM 1.36 (H) 0.61 - 1.24 mg/dL Final  12/17/2018 06:46 AM 1.46 (H) 0.61 - 1.24 mg/dL Final  12/14/2018 05:52 AM 1.41 (H) 0.61 - 1.24 mg/dL Final  12/12/2018 05:39 AM 1.41 (H) 0.61 - 1.24 mg/dL Final  12/10/2018 05:04 AM 1.48 (H) 0.61 - 1.24 mg/dL Final  12/09/2018 03:43 AM 1.37 (H) 0.61 - 1.24 mg/dL Final  12/07/2018 07:43 AM 1.22 0.61 - 1.24 mg/dL Final  12/05/2018 08:19 AM 1.20 0.61 - 1.24 mg/dL Final  12/04/2018 03:41 AM 1.29 (H) 0.61 - 1.24 mg/dL Final  12/03/2018 08:22 AM 1.23 0.61 - 1.24 mg/dL Final  12/02/2018 08:28 AM 1.17 0.61 - 1.24 mg/dL Final  11/30/2018 06:49 AM 1.25 (H) 0.61 - 1.24 mg/dL Final  11/27/2018 05:09 AM 1.27 (H) 0.61 - 1.24 mg/dL Final  11/26/2018 05:01 AM 1.67 (H) 0.61 - 1.24 mg/dL Final  11/25/2018 03:01 AM 1.69 (H) 0.61 - 1.24 mg/dL Final  11/24/2018 03:28 AM 1.43 (H) 0.61 - 1.24 mg/dL Final  11/23/2018 03:42 AM 1.57 (H) 0.61 - 1.24 mg/dL Final  11/22/2018 04:51 AM 1.60 (H) 0.61 - 1.24 mg/dL Final  11/21/2018 02:48 AM 1.71 (H) 0.61 - 1.24 mg/dL Final  11/20/2018 07:38 AM 1.53 (H) 0.61 - 1.24 mg/dL Final  11/19/2018 04:17 AM 1.38 (H) 0.61 - 1.24 mg/dL Final  11/18/2018 09:23 PM 1.27 (H) 0.61 - 1.24 mg/dL Final  11/16/2018 05:04 AM 1.45 (H) 0.61 - 1.24 mg/dL Final  11/15/2018 06:27 AM 1.33 (H) 0.61 - 1.24 mg/dL Final  11/14/2018 06:39 AM 1.40 (H) 0.61 - 1.24 mg/dL Final  11/13/2018 06:49 AM 1.29 (H) 0.61 - 1.24 mg/dL Final  11/12/2018 06:54 AM 1.38 (H) 0.61 - 1.24 mg/dL Final  11/11/2018 06:24 AM 1.36 (H) 0.61 - 1.24 mg/dL Final  11/10/2018 06:55 AM 1.32 (H) 0.61 - 1.24 mg/dL Final  11/09/2018 12:09 PM 1.29 (H) 0.61 - 1.24 mg/dL Final  09/21/2018 05:25 AM 1.18 0.61 - 1.24 mg/dL Final  09/16/2018 05:18 AM 1.13 0.61 - 1.24 mg/dL Final  09/14/2018 05:14 AM 1.06 0.61 - 1.24 mg/dL Final  09/10/2018 06:59 AM  1.10 0.61 - 1.24 mg/dL Final  09/08/2018 06:16 AM 0.98 0.61 - 1.24 mg/dL Final     PMHx:   Past Medical History:  Diagnosis Date  . Arthritis   . Borderline diabetic   . CHF (congestive heart failure) ([place])   . Chronic kidney disease   . Diabetes mellitus without complication ([place])   . Dysrhythmia   . Glaucoma   . Hyperlipidemia   . Hypertension   . Hypothyroidism   . PVD (peripheral vascular disease) ([place])   . Sleep apnea    Cpap ordered but doesnt use  . Urinary retention due to benign prostatic hyperplasia 12/28/2018    Past Surgical History:  Procedure Laterality Date  . ABDOMINAL AORTOGRAM W/LOWER EXTREMITY N/A 07/27/2018   Procedure: ABDOMINAL AORTOGRAM W/LOWER EXTREMITY;  Surgeon: Lorretta Harp, MD;  Location: [place] [facility];  Service: Cardiovascular;  Laterality: N/A;  . AMPUTATION Right 08/28/2018   Procedure: RIGHT BELOW KNEE AMPUTATION;  Surgeon: [name], MD;  Location: [place];  Service: Orthopedics;  Laterality: Right;  . AMPUTATION Left 12/02/2018   Procedure: LEFT TRANSMETATARSAL AMPUTATION AND NEGATIVE PRESSURE WOUND VAC PLACEMENT;  Surgeon: [name], MD;  Location: [place];  Service: Orthopedics;  Laterality: Left;  . AMPUTATION Left 12/18/2018   Procedure: LEFT BELOW KNEE AMPUTATION;  Surgeon: [name], MD;  Location: [place];  Service: Orthopedics;  Laterality: Left;  . APPENDECTOMY  1943  . BELOW KNEE LEG AMPUTATION Left 12/18/2018  . CATARACT EXTRACTION  2012   x2  . COLONOSCOPY N/A 11/01/2014   Procedure: COLONOSCOPY;  Surgeon: Aviva Signs, MD;  [facility];  Service: Gastroenterology;  Laterality: N/A;  . ESOPHAGOGASTRODUODENOSCOPY N/A 11/01/2014   Procedure: ESOPHAGOGASTRODUODENOSCOPY (EGD);  Surgeon: Aviva Signs, MD;  [facility];  Service: Gastroenterology;  Laterality: N/A;  . HERNIA REPAIR Left   . INGUINAL HERNIA REPAIR Right 03/01/2013   Procedure: RIGHT INGUINAL HERNIORRHAPHY;  Surgeon: Jamesetta So, MD;  Location: [facility];  Service: General;  Laterality: Right;  . INSERTION OF MESH Right 03/01/2013   Procedure: INSERTION OF MESH;  Surgeon: Jamesetta So, MD;  [facility];  Service: General;  Laterality: Right;  . LOWER EXTREMITY ANGIOGRAPHY Right 08/25/2018   Procedure: LOWER EXTREMITY ANGIOGRAPHY;  Surgeon: Wellington Hampshire, MD;  Location: [place] [facility];  Service: Cardiovascular;  Laterality: Right;  . LOWER EXTREMITY ANGIOGRAPHY N/A 11/25/2018   Procedure: LOWER EXTREMITY ANGIOGRAPHY;  Surgeon: Wellington Hampshire, MD;  Location: [place] [facility];  Service: Cardiovascular;  Laterality: N/A;  . NM  MYOCAR PERF WALL MOTION  06/05/2010   no significant ischemia  . PERIPHERAL VASCULAR ATHERECTOMY Right 08/03/2018   Procedure: PERIPHERAL VASCULAR ATHERECTOMY;  Surgeon: Lorretta Harp, MD;  Location: [place] [facility];  Service: Cardiovascular;  Laterality: Right;  . PERIPHERAL VASCULAR ATHERECTOMY Left 11/23/2018   Procedure: PERIPHERAL VASCULAR ATHERECTOMY;  Surgeon: Lorretta Harp, MD;  Location: [place] [facility];  Service: Cardiovascular;  Laterality: Left;  sfa with dc balloon  . PERIPHERAL VASCULAR BALLOON ANGIOPLASTY Left 11/25/2018   Procedure: PERIPHERAL VASCULAR BALLOON ANGIOPLASTY;  Surgeon: Wellington Hampshire, MD;  Location: [place] [facility];  Service: Cardiovascular;  Laterality: Left;  popliteal  . PERIPHERAL VASCULAR INTERVENTION Left 11/25/2018   Procedure: PERIPHERAL VASCULAR INTERVENTION;  Surgeon: Wellington Hampshire, MD;  Location: [place] [facility];  Service: Cardiovascular;  Laterality: Left;  tibial peroneal trunk and peroneal stents   . stent  06/14/2010   left leg  . STUMP REVISION Left 01/20/2019   Procedure: REVISION LEFT BELOW KNEE AMPUTATION;  Surgeon: [name], MD;  Location: [place];  Service: Orthopedics;  Laterality: Left;  . US ECHOCARDIOGRAPHY  01/21/2006   moderate mitral annular ca+, mild MR, AOV moderately sclerotic.    Family  Hx:  Family History  Problem Relation Age of Onset  . Cancer Mother   . Heart attack Father   . Stroke Father   . Heart attack Brother   . Heart attack Brother     Social History:  reports that he quit smoking about 54 years ago. He has quit using smokeless tobacco.  His smokeless tobacco use included chew. He reports that he does not drink alcohol and does not use drugs.  Allergies:  Allergies  Allergen Reactions  . Iodinated Diagnostic Agents Other (See Comments)    caused Fever  . Dye Fdc Red [Red Dye] Hives    Tolerated red Docusate, Niferex    Medications: Prior to Admission medications   Medication Sig Start Date End Date Taking? Authorizing Provider  acetaminophen (TYLENOL) 500 MG tablet Take 500 mg by mouth every 6 (six) hours as needed for headache (pain).   Yes [provider]  amLODipine (NORVASC) 2.5 MG tablet Take 5 mg by mouth every evening.  03/31/19  Yes [provider]  apixaban (ELIQUIS) 5 MG TABS tablet Take 2.5 mg by mouth 2 (two) times daily.    Yes [provider]  Ascorbic Acid (VITAMIN C) 1000 MG tablet Take 1,000 mg by mouth daily.    Yes [provider]  aspirin EC 81 MG tablet Take 81 mg by mouth daily.   Yes [provider]  atorvastatin (LIPITOR) 40 MG tablet TAKE 1 TABLET BY MOUTH EVERY DAY Patient taking differently: Take 40 mg by mouth every evening.  04/06/19  Yes Hilty, Nadean Corwin, MD  Cholecalciferol (VITAMIN D-3) 125 MCG (5000 UT) TABS Take 5,000 Units by mouth every evening.   Yes [provider]  clopidogrel (PLAVIX) 75 MG tablet Take 1 tablet (75 mg total) by mouth daily with breakfast. 12/10/18  Yes Gonfa, Taye T, MD  famotidine (PEPCID) 20 MG tablet Take 1 tablet (20 mg total) by mouth daily. 11/12/18  Yes Johnson, Clanford L, MD  ferrous sulfate 325 (65 FE) MG tablet Take 325 mg by mouth 2 (two) times daily with a meal.   Yes [provider]  finasteride (PROSCAR) 5 MG tablet Take  1 tablet (5 mg total) by mouth every evening. 09/22/18  Yes Moore,  Lavon Paganini, PA-C  furosemide (LASIX) 40 MG tablet Take 20-40 mg by mouth See admin instructions. Take one tablet (40 mg) by mouth every morning and 1/2 tablet (20 mg) every evening   Yes [provider]  glipiZIDE (GLUCOTROL) 5 MG tablet Take 5 mg by mouth 2 (two) times daily.    Yes [provider]  latanoprost (XALATAN) 0.005 % ophthalmic solution Place 1 drop into both eyes at bedtime. 09/30/19  Yes [provider]  levothyroxine (SYNTHROID) 75 MCG tablet Take 75 mcg by mouth daily before breakfast.  02/12/19  Yes [provider]  metoprolol tartrate (LOPRESSOR) 25 MG tablet Take 12.5 mg by mouth daily as needed (palpitations/heart racing).   Yes [provider]  Misc Natural Products (GLUCOSAMINE CHOND MSM FORMULA PO) Take 1 tablet by mouth 2 (two) times daily.   Yes [provider]  polyethylene glycol (MIRALAX) 17 g packet Take 17 g by mouth daily as needed for moderate constipation. 12/09/18  Yes Mercy Riding, MD  Potassium Gluconate 550 (90 K) MG TABS Take 90 mg by mouth 2 (two) times daily.   Yes [provider]  pyridoxine (B-6) 100 MG tablet Take 100 mg by mouth daily.   Yes [provider]  traMADol (ULTRAM) 50 MG tablet Take 1 tablet (50 mg total) by mouth every 6 (six) hours as needed. Patient taking differently: Take 50 mg by mouth every 6 (six) hours as needed (pain).  02/15/19  Yes Lovorn, Megan, MD  vitamin E 400 UNIT capsule Take 400 Units by mouth daily.   Yes [provider]  diclofenac Sodium (VOLTAREN) 1 % GEL Apply 2 g topically 4 (four) times daily. Patient not taking: Reported on 10/04/2019 01/05/19   Love, Ivan Anchors, PA-C  dorzolamide-timolol (COSOPT) 22.3-6.8 MG/ML ophthalmic solution Place 1 drop into both eyes 2 (two) times daily. Patient not taking: Reported on 10/04/2019    [provider]  methocarbamol (ROBAXIN) 500 MG  tablet Take 1 tablet (500 mg total) by mouth every 6 (six) hours as needed for muscle spasms. Patient not taking: Reported on 10/04/2019 01/22/19   Debbe Odea, MD  [facility] ULTRA test strip 1 each 2 (two) times daily. 04/15/19   [provider]  potassium chloride (KLOR-CON) 10 MEQ tablet Take 1 tablet (10 mEq total) by mouth daily. Patient not taking: Reported on 10/04/2019 11/12/18   Murlean Iba, MD  tamsulosin (FLOMAX) 0.4 MG CAPS capsule Take 1 capsule (0.4 mg total) by mouth 2 (two) times daily. Patient not taking: Reported on 10/04/2019 09/22/18   Angiulli, Lavon Paganini, PA-C    I have reviewed the patient's current and reported prior to admission medications.  Labs:  BMP Latest Ref Rng & Units 10/07/2019 10/06/2019 10/05/2019  Glucose 70 - 99 mg/dL 208(H) 243(H) 203(H)  BUN 8 - 23 mg/dL 46(H) 42(H) 38(H)  Creatinine 0.61 - 1.24 mg/dL 2.73(H) 2.67(H) 2.55(H)  BUN/Creat Ratio 10 - 24 - - -  Sodium 135 - 145 mmol/L 133(L) 131(L) 135  Potassium 3.5 - 5.1 mmol/L 4.4 4.0 3.5  Chloride 98 - 111 mmol/L 96(L) 98 96(L)  CO2 22 - 32 mmol/L 24 25 26   Calcium 8.9 - 10.3 mg/dL 8.9 8.9 9.2    Urinalysis    Component Value Date/Time   COLORURINE STRAW (A) 02/02/2019 1924   APPEARANCEUR CLEAR 02/02/2019 1924   LABSPEC 1.004 (L) 02/02/2019 1924   PHURINE 7.0 02/02/2019 1924   GLUCOSEU NEGATIVE 02/02/2019 1924   HGBUR  NEGATIVE 02/02/2019 1924   BILIRUBINUR NEGATIVE 02/02/2019 [facility] NEGATIVE 02/02/2019 1924   PROTEINUR NEGATIVE 02/02/2019 1924   NITRITE NEGATIVE 02/02/2019 1924   LEUKOCYTESUR MODERATE (A) 02/02/2019 1924     ROS:  Pertinent items noted in HPI and remainder of comprehensive ROS otherwise negative. though limited secondary to patient - he does not know the year so question his recall    Physical Exam: Vitals:   10/07/19 0828 10/07/19 1124  BP: (!) 152/78 (!) 147/77  Pulse: 64 60  Resp: 18 20  Temp: 97.8 F (36.6 C) 97.6 F (36.4 C)  SpO2: 91% 92%      General: elderly male in bed in NAD at rest HEENT: NCAT Eyes: EOMI sclera anicteric glasses Neck: supple trachea midline Heart: reg rate and rhythm harsh murmur right upper sternal border Lungs: unlabored at rest on room air; dec breath sounds bases and infrequent wheeze Abdomen: soft/nt/nd Extremities: bilateral BKA sleeves on Skin: no rash on extremities exposed Neuro: awake and oriented to person and the hospital name but not year Psych no anxiety or agitation  GU foley in place  Assessment/Plan:  # NSTEMI - medical management recommended per cardiology   # Acute kidney injury - Secondary to ischemic and pre-renal insults in the setting of NSTEMI and afib, home diuretic use in setting of CHF  - Obtain renal ultrasound  - Would continue supportive measuers - Continue foley  - ok to continue lasix 40 mg daily for now and reassess (note home dose 40 mg in AM and 20 mg PM) - would hold home potassium given his AKI and follow for need to transition to renal diet  # Chronic kidney disease stage III - Baseline creatinine 1.3-1.4  # Chronic diastolic CHF  - Continue lasix 40 mg daily for now   # Moderate to severe aortic stenosis - aiming for euvolemic to slightly positive as tolerated per resp status   # BPH  - Continue home regimen of flomax and finasteride - Continue foley  # HTN  - reasonable control on current regimen  - note lasix started as above  # normocytic anemia  - Mild and not acute issue at this time   Claudia Desanctis 10/07/2019, 1:33 PM

## 2019-10-07 NOTE — Progress Notes (Signed)
PROGRESS NOTE    DUANNE DUCHESNE  TWS:[id number] DOB: [birth year] DOA: 10/04/2019 PCP: Redmond School, MD   Brief Narrative:  [name] is a 86 y.o. male with medical history significant for atrial fibrillation, aortic stenosis, chronic diastolic heart failure, hypertension, diabetes mellitus type 2, peripheral vascular disease, s/p bilateral AKA and has bilateral prosthesis in place, hypothyroidism who presents EMS to the emergency room with complaint of chest pain.  Patient son is at bedside and helps provide history.  Patient returned home from rehab/PT appointment this afternoon around 2:00 pm and developed a substernal chest pressure and tightness in the upper chest.  States he was not doing any particular activity when the chest pressure began.  Reports he did not have chest pressure when he was at his physical therapy appointment earlier in the day.  After sitting and resting it continued to occur.  His son gave him a Pepcid for presumed indigestion but the pain did not improve.  He then developed left shoulder and left upper arm pain and so was brought to the emergency room for further evaluation.  He did not have any shortness of breath, nausea, vomiting or diaphoresis with the episode.  Reports that the pressure was an 8-9/10 at its worst and is currently solved.  He was given aspirin by EMS in route to the hospital.  Denies any complaints at this time. He has 1 son who lives with him during the week and his other children stay with him over the weekend he always has somebody at home with him. Reports he has been compliant with his medications. In ED: Mr. Horn has an elevated troponin level in the emergency room.  Repeat troponin shows an increase from the initial level around 100 to over 500.  His EKG shows no ST elevation.  He is found to have AKI superimposed on his chronic kidney disease.  ER provider discussed with cardiology who is going to see patient in the emergency room and provide  recommendations. Hospitalist service is asked to evaluate and help manage patient  Assessment & Plan:   Principal Problem:   NSTEMI (non-ST elevated myocardial infarction) ([place]) Active Problems:   Essential hypertension, benign   Moderate aortic stenosis   Chronic diastolic CHF (congestive heart failure) (HCC)   Atrial fibrillation, chronic   Type 2 diabetes mellitus with vascular disease (HCC)   S/P BKA (below knee amputation) unilateral, right (HCC)   Acute kidney injury superimposed on CKD ([place])   Hypokalemia   NSTEMI (non-ST elevated myocardial infarction) ([place]), unspecified, POA - Cardiology continues to follow -appreciate insight and recommendations - Heparin drip off, resume Eliquis and Plavix, discontinue aspirin - Lengthy discussion daily at bedside with patient and his son that medical management would likely be in the best interest given patient's minimal symptoms, essentially nonambulatory status very minimal exertion and risk given age and comorbidities as below -Somewhat limited in medical therapy options given patient's aortic stenosis, CKD which limits nitrates and Ranexa.  Continue to titrate beta-blocker along with cardiology -Continue statin therapy and close monitoring  Acute kidney injury superimposed on CKD 2 (HCC) -Creatinine somewhat labile but stable over the past 72h, likely his new baseline -Encouraged increased p.o. intake of free water  Essential hypertension - Continue patient's home dose of Norvasc.  We will add low-dose Coreg 3.125 mg twice   Hypokalemia  WNL  S/P BKA (below knee amputation) bilateral.  DVT prophylaxis: Transition from heparin drip to Eliquis as above Code Status: Full  Family Communication: Son at bedside  Status is: Patient  Dispo: The patient is from: Home              Anticipated d/c is to: To be determined              Anticipated d/c date is: 24-48 hours pending clinical course              Patient currently not  medically stable for discharge due to ongoing need for further evaluation and possible intervention with cardiology  Consultants:   Cardiology  Procedures:   None planned  Antimicrobials:  None indicated  Subjective: No acute issues or events overnight, review systems somewhat limited patient denies overtly any chest pain, nausea, vomiting, diarrhea, constipation, headache, fevers, or chills.  Objective: Vitals:   10/06/19 1603 10/06/19 2103 10/07/19 0616 10/07/19 0756  BP: 130/88 (!) 124/94 (!) 143/74   Pulse: 88 69 (!) 59 (!) 57  Resp: 16 18 16 20   Temp: 97.6 F (36.4 C) (!) 97.2 F (36.2 C) 97.8 F (36.6 C)   TempSrc: Oral Oral Oral   SpO2: 95% 93% 93% 92%  Weight:   99.1 kg   Height:        Intake/Output Summary (Last 24 hours) at 10/07/2019 0758 Last data filed at 10/07/2019 0600 Gross per 24 hour  Intake 2027.77 ml  Output 1250 ml  Net 777.77 ml   Filed Weights   10/05/19 0452 10/06/19 0420 10/07/19 0616  Weight: 94.2 kg 98.7 kg 99.1 kg    Examination:  General:  Pleasantly resting in bed, No acute distress. HEENT:  Normocephalic atraumatic.  Sclerae nonicteric, noninjected.  Extraocular movements intact bilaterally. Neck:  Without mass or deformity.  Trachea is midline. Lungs:  Clear to auscultate bilaterally without rhonchi, wheeze, or rales. Heart:  Regular rate and rhythm.  Blowing holosystolic murmur without overt rubs or gallops Abdomen:  Soft, nontender, nondistended.  Without guarding or rebound. Extremities: Without cyanosis, clubbing, edema, or obvious deformity.  Bilateral lower extremity BKA Vascular:  Dorsalis pedis and posterior tibial pulses palpable bilaterally. Skin:  Warm and dry, no erythema, no rashes.   Data Reviewed: I have personally reviewed following labs and imaging studies  CBC: Recent Labs  Lab 10/04/19 1800 10/05/19 0333 10/06/19 1430 10/07/19 0416  WBC 10.9* 9.7 8.6 10.3  HGB 12.7* 13.2 11.6* 11.2*  HCT 38.1* 38.7*  34.1* 33.5*  MCV 94.1 94.6 94.7 95.7  PLT 222 211 183 782   Basic Metabolic Panel: Recent Labs  Lab 10/04/19 1800 10/04/19 1910 10/05/19 0333 10/06/19 1430 10/07/19 0416  NA 133*  --  135 131* 133*  K 3.0*  --  3.5 4.0 4.4  CL 94*  --  96* 98 96*  CO2 26  --  26 25 24   GLUCOSE 229*  --  203* 243* 208*  BUN 39*  --  38* 42* 46*  CREATININE 2.72*  --  2.55* 2.67* 2.73*  CALCIUM 9.0  --  9.2 8.9 8.9  MG  --  2.5*  --   --   --    GFR: Estimated Creatinine Clearance: 16.3 mL/min (A) (by C-G formula based on SCr of 2.73 mg/dL (H)). Liver Function Tests: Recent Labs  Lab 10/04/19 1800  AST 26  ALT 17  ALKPHOS 61  BILITOT 0.6  PROT 7.0  ALBUMIN 3.2*   No results for input(s): LIPASE, AMYLASE in the last 168 hours. No results for input(s): AMMONIA in the last 168 hours.  Coagulation Profile: No results for input(s): INR, PROTIME in the last 168 hours. Cardiac Enzymes: No results for input(s): CKTOTAL, CKMB, CKMBINDEX, TROPONINI in the last 168 hours. BNP (last 3 results) No results for input(s): PROBNP in the last 8760 hours. HbA1C: Recent Labs    10/05/19 0333  HGBA1C 8.5*   CBG: Recent Labs  Lab 10/05/19 2149 10/06/19 0743 10/06/19 1121 10/06/19 1629 10/06/19 2105  GLUCAP 233* 210* 226* 235* 245*   Lipid Profile: No results for input(s): CHOL, HDL, LDLCALC, TRIG, CHOLHDL, LDLDIRECT in the last 72 hours. Thyroid Function Tests: Recent Labs    10/05/19 0333  TSH 2.567   Anemia Panel: No results for input(s): VITAMINB12, FOLATE, FERRITIN, TIBC, IRON, RETICCTPCT in the last 72 hours. Sepsis Labs: No results for input(s): PROCALCITON, LATICACIDVEN in the last 168 hours.  Recent Results (from the past 240 hour(s))  SARS Coronavirus 2 by RT PCR (hospital order, performed in [facility] hospital lab) Nasopharyngeal Nasopharyngeal Swab     Status: None   Collection Time: 10/05/19 12:27 AM   Specimen: Nasopharyngeal Swab  Result Value Ref Range Status    SARS Coronavirus 2 NEGATIVE NEGATIVE Final    Comment: (NOTE) SARS-CoV-2 target nucleic acids are NOT DETECTED.  The SARS-CoV-2 RNA is generally detectable in upper and lower respiratory specimens during the acute phase of infection. The lowest concentration of SARS-CoV-2 viral copies this assay can detect is 250 copies / mL. A negative result does not preclude SARS-CoV-2 infection and should not be used as the sole basis for treatment or other patient management decisions.  A negative result may occur with improper specimen collection / handling, submission of specimen other than nasopharyngeal swab, presence of viral mutation(s) within the areas targeted by this assay, and inadequate number of viral copies (<250 copies / mL). A negative result must be combined with clinical observations, patient history, and epidemiological information.  Fact Sheet for Patients:   StrictlyIdeas.no  Fact Sheet for Healthcare Providers: BankingDealers.co.za  This test is not yet approved or  cleared by the [place] FDA and has been authorized for detection and/or diagnosis of SARS-CoV-2 by FDA under an Emergency Use Authorization (EUA).  This EUA will remain in effect (meaning this test can be used) for the duration of the COVID-19 declaration under Section 564(b)(1) of the Act, 21 U.S.C. section 360bbb-3(b)(1), unless the authorization is terminated or revoked sooner.  Performed at [facility], [place] [address] [place], [place] [address]    Radiology Studies: No results found.   Scheduled Meds: . amLODipine  2.5 mg Oral Daily  . apixaban  2.5 mg Oral BID  . atorvastatin  40 mg Oral Daily  . carvedilol  12.5 mg Oral BID WC  . Chlorhexidine Gluconate Cloth  6 each Topical Daily  . clopidogrel  75 mg Oral Daily  . finasteride  5 mg Oral QPM  . insulin aspart  0-5 Units Subcutaneous QHS  . insulin aspart  0-9 Units Subcutaneous TID  WC  . latanoprost  1 drop Both Eyes QHS  . levothyroxine  75 mcg Oral Daily  . potassium chloride  10 mEq Oral Daily  . tamsulosin  0.4 mg Oral BID   Continuous Infusions: . lactated ringers 50 mL/hr at 10/07/19 0600     LOS: 2 days   Time spent: 33min  Lander C Gatlin Kittell, DO [facility]  If 7PM-7AM, please contact night-coverage [email or web address]  10/07/2019, 7:58 AM

## 2019-10-07 NOTE — Evaluation (Signed)
Physical Therapy Evaluation Patient Details Name: [name] MRN: [id number] DOB: September 11, 1927 Today's Date: 10/07/2019   History of Present Illness  86 y.o. male with medical history significant for atrial fibrillation, aortic stenosis, chronic diastolic heart failure, hypertension, diabetes mellitus type 2, peripheral vascular disease, s/p bilateral AKA and has bilateral prosthesis in place, hypothyroidism who presents EMS to the emergency room with complaint of chest pain. In ED found to have elevated troponins and AKI superimposed on his chronic kidney disease. Admitted 10/04/19 for treatment of NSTEMI and AKI  Clinical Impression  PTA pt family provided increased support 24 hr/day to maintain pt in is his home environment. Pt son reports that he dons bilateral prosthetics and mod A for bed mobility. Pt able to use standing Stedy with vest to mobilize in home and wheelchair in community. Pt is being seen at outpatient PT prior to admission. Pt currently requires max A for bed mobility and initially min A for power up to [place]. Pt with decreased endurance and requires maxA and total A for sit>stand utilizing Stedy to return to bed after use of BSC. PT recommending HHPT at discharge to regain UE strength and endurance to eventually return to outpatient PT. PT will continue to follow acutely.     Follow Up Recommendations Home health PT;Supervision/Assistance - 24 hour    Equipment Recommendations  None recommended by PT       Precautions / Restrictions Precautions Precautions: Fall Restrictions Weight Bearing Restrictions: No      Mobility  Bed Mobility Overal bed mobility: Needs Assistance Bed Mobility: Supine to Sit     Supine to sit: Max assist     General bed mobility comments: son [name] provides maxA for bringing pt to EoB   Transfers Overall transfer level: Needs assistance   Transfers: Sit to/from Stand Sit to Stand: Min assist;Total assist;From elevated surface;Max  assist         General transfer comment: intial power up to Stedy min A with vc for upright posture, once in [place] pt reports need to have BM, transferred to [facility] and pt min A for power up from Stedy pads to lower back on BSC, maxA for power up to [place] for pericare, unable to maintain stand and needs to sit back on BSC. Pt requires total A for power up to [facility] for pad placement to transfer back to bed, and total A for power up from raised Stedy pads to return to bed         Balance Overall balance assessment: Needs assistance Sitting-balance support: Feet supported;Bilateral upper extremity supported Sitting balance-Leahy Scale: Poor Sitting balance - Comments: posterior lean                                     Pertinent Vitals/Pain Pain Assessment: No/denies pain    Home Living Family/patient expects to be discharged to:: Private residence Living Arrangements: Children Available Help at Discharge: Family;Available 24 hours/day Type of Home: House Home Access: Ramped entrance     Home Layout: One level Home Equipment: Walker - 2 wheels;Bedside commode;Shower seat;Wheelchair - manual;Hospital bed Additional Comments: has steady mechanical lift    Prior Function Level of Independence: Needs assistance   Gait / Transfers Assistance Needed: non-ambulatory, uses steady lift with RLE prosthetic leg on for transfers, 1 person assistance for bed mobility  ADL's / Homemaking Assistance Needed: assisted by family  Hand Dominance   Dominant Hand: Right    Extremity/Trunk Assessment   Upper Extremity Assessment Upper Extremity Assessment: Generalized weakness    Lower Extremity Assessment Lower Extremity Assessment: RLE deficits/detail;LLE deficits/detail RLE Deficits / Details: R BKA, utilizes prosthetic  LLE Deficits / Details: L BKA, utilizes prosthetic        Communication   Communication: HOH  Cognition Arousal/Alertness:  Awake/alert Behavior During Therapy: WFL for tasks assessed/performed                                   General Comments: Son [name] provides all home set up and PLOF information, pt answers questions appropriately, does not volunteer a lot of information       General Comments General comments (skin integrity, edema, etc.): BP 163/82 in supine, HR 62-92 during session, SaO2 >90%O2 on RA, son [name] provides assistance trhoughout session         Assessment/Plan    PT Assessment Patient needs continued PT services  PT Problem List Decreased strength;Decreased activity tolerance;Decreased mobility;Cardiopulmonary status limiting activity       PT Treatment Interventions DME instruction;Functional mobility training;Therapeutic activities;Therapeutic exercise;Balance training;Cognitive remediation;Patient/family education    PT Goals (Current goals can be found in the Care Plan section)  Acute Rehab PT Goals Patient Stated Goal: 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  PT Goal Formulation: With patient/family Time For Goal Achievement: 10/21/19 Potential to Achieve Goals: Fair    Frequency Min 3X/week    AM-PAC PT "6 Clicks" Mobility  Outcome Measure Help needed turning from your back to your side while in a flat bed without using bedrails?: A Lot Help needed moving from lying on your back to sitting on the side of a flat bed without using bedrails?: Total Help needed moving to and from a bed to a chair (including a wheelchair)?: Total Help needed standing up from a chair using your arms (e.g., wheelchair or bedside chair)?: Total Help needed to walk in hospital room?: Total Help needed climbing 3-5 steps with a railing? : Total 6 Click Score: 7    End of Session Equipment Utilized During Treatment: Gait belt Activity Tolerance: Patient limited by fatigue Patient left: in bed;with call bell/phone within reach;with family/visitor present Nurse Communication: Mobility  status;Other (comment);Need for lift equipment (use of bed pan instead of BSC) PT Visit Diagnosis: Muscle weakness (generalized) (M62.81);Difficulty in walking, not elsewhere classified (R26.2);Unsteadiness on feet (R26.81)    Time: 9449-6759 PT Time Calculation (min) (ACUTE ONLY): 55 min   Charges:   PT Evaluation $PT Eval Moderate Complexity: 1 Mod PT Treatments $Therapeutic Activity: 38-52 mins        Sabree Nuon B. Migdalia Dk PT, DPT Acute Rehabilitation Services Pager [phone] Office [phone]   [place] 10/07/2019, 1:03 PM

## 2019-10-07 NOTE — TOC Progression Note (Signed)
Transition of Care [facility]) - Progression Note    Patient Details  Name: TYGER WICHMAN MRN: [id number] Date of Birth: January 13, 1928  Transition of Care [facility]) CM/SW [place], RN Phone Number: 10/07/2019, 2:20 PM  Clinical Narrative:   Case manager spoke to patient and son today about home health physical therapy.  Case manager gave Medicare.gov list for home health agencies to son.  Son said that in the past they have used [facility] and would like to use them again if possible.  Case manager called [facility], spoke to Butch Penny, and will be providing home health physical therapy. Start of care to start 24-48 hours post discharge to home.    Expected Discharge Plan: [place] Barriers to Discharge: No Barriers Identified  Expected Discharge Plan and Services Expected Discharge Plan: [place]   Discharge Planning Services: CM Consult Post Acute Care Choice: [place] arrangements for the past 2 months: [place]: PT [place]: [place] ([place]) Date [place]: 10/07/19 Time [place]: 1400 Representative spoke with at [place]: [place] ([place]) Interventions    Readmission Risk Interventions Readmission Risk Prevention Plan 10/06/2019 02/04/2019 12/22/2018  Transportation Screening Complete Complete Complete  PCP or Specialist Appt within 3-5 Days - - -  Not Complete comments - - -  HRI or [place] or Home Care Consult comments - - -  Social Work Scientific laboratory technician for [place] Planning/Counseling - - -  Palliative Care Screening - - -  Medication Review Press photographer) Complete Complete Complete  PCP or Specialist appointment within 3-5 days of discharge Complete Complete Complete  HRI or [place] Complete Complete Complete  SW Recovery Care/Counseling Consult  Complete Complete Complete  Palliative Care Screening Not Applicable Not Applicable Not [place] Not Applicable Not Applicable Patient Refused  Some recent data might be hidden

## 2019-10-07 NOTE — Progress Notes (Signed)
Inpatient Diabetes Program Recommendations  AACE/ADA: New Consensus Statement on Inpatient Glycemic Control (2015)  Target Ranges:  Prepandial:   less than 140 mg/dL      Peak postprandial:   less than 180 mg/dL (1-2 hours)      Critically ill patients:  140 - 180 mg/dL   Lab Results  Component Value Date   GLUCAP 232 (H) 10/07/2019   HGBA1C 8.5 (H) 10/05/2019    Review of Glycemic Control Results for [name], [name] (MRN [id number]) as of 10/07/2019 12:25  Ref. Range 10/06/2019 16:29 10/06/2019 21:05 10/07/2019 08:32 10/07/2019 11:21  Glucose-Capillary Latest Ref Range: 70 - 99 mg/dL 235 (H) 245 (H) 176 (H) 232 (H)   Diabetes history: DM 2 Outpatient Diabetes medications: Glucotrol 5 mg bid Current orders for Inpatient glycemic control:  Novolog sensitive tid with meals and HS  Inpatient Diabetes Program Recommendations:   Note that blood sugars slightly >goal.  If blood sugars continue to be >goal, Consider adding Levemir 5 units bid while patient is in the hospital.   Thanks, Bronson Curb, MSN, RNC-OB Diabetes Coordinator [phone] (8a-5p)

## 2019-10-08 ENCOUNTER — Other Ambulatory Visit: Payer: Self-pay | Admitting: Internal Medicine

## 2019-10-08 DIAGNOSIS — I4819 Other persistent atrial fibrillation: Secondary | ICD-10-CM

## 2019-10-08 LAB — CBC
HCT: 33.6 % — ABNORMAL LOW (ref 39.0–52.0)
Hemoglobin: 11.2 g/dL — ABNORMAL LOW (ref 13.0–17.0)
MCH: 31.5 pg (ref 26.0–34.0)
MCHC: 33.3 g/dL (ref 30.0–36.0)
MCV: 94.6 fL (ref 80.0–100.0)
Platelets: 177 10*3/uL (ref 150–400)
RBC: 3.55 MIL/uL — ABNORMAL LOW (ref 4.22–5.81)
RDW: 14.1 % (ref 11.5–15.5)
WBC: 14 10*3/uL — ABNORMAL HIGH (ref 4.0–10.5)
nRBC: 0 % (ref 0.0–0.2)

## 2019-10-08 LAB — BLOOD GAS, ARTERIAL
Acid-Base Excess: 2.3 mmol/L — ABNORMAL HIGH (ref 0.0–2.0)
Bicarbonate: 26.3 mmol/L (ref 20.0–28.0)
Drawn by: 55062
FIO2: 28
O2 Saturation: 93.4 %
Patient temperature: 37.6
pCO2 arterial: 41.3 mmHg (ref 32.0–48.0)
pH, Arterial: 7.423 (ref 7.350–7.450)
pO2, Arterial: 67.8 mmHg — ABNORMAL LOW (ref 83.0–108.0)

## 2019-10-08 LAB — BASIC METABOLIC PANEL
Anion gap: 14 (ref 5–15)
BUN: 45 mg/dL — ABNORMAL HIGH (ref 8–23)
CO2: 22 mmol/L (ref 22–32)
Calcium: 8.8 mg/dL — ABNORMAL LOW (ref 8.9–10.3)
Chloride: 93 mmol/L — ABNORMAL LOW (ref 98–111)
Creatinine, Ser: 2.76 mg/dL — ABNORMAL HIGH (ref 0.61–1.24)
GFR calc Af Amer: 22 mL/min — ABNORMAL LOW (ref >60)
GFR calc non Af Amer: 19 mL/min — ABNORMAL LOW (ref >60)
Glucose, Bld: 199 mg/dL — ABNORMAL HIGH (ref 70–99)
Potassium: 3.8 mmol/L (ref 3.5–5.1)
Sodium: 129 mmol/L — ABNORMAL LOW (ref 135–145)

## 2019-10-08 LAB — URINALYSIS, COMPLETE (UACMP) WITH MICROSCOPIC
Bilirubin Urine: NEGATIVE
Glucose, UA: NEGATIVE mg/dL
Ketones, ur: NEGATIVE mg/dL
Nitrite: NEGATIVE
Protein, ur: 30 mg/dL — AB
Specific Gravity, Urine: 1.008 (ref 1.005–1.030)
pH: 5 (ref 5.0–8.0)

## 2019-10-08 LAB — GLUCOSE, CAPILLARY
Glucose-Capillary: 209 mg/dL — ABNORMAL HIGH (ref 70–99)
Glucose-Capillary: 222 mg/dL — ABNORMAL HIGH (ref 70–99)
Glucose-Capillary: 224 mg/dL — ABNORMAL HIGH (ref 70–99)
Glucose-Capillary: 261 mg/dL — ABNORMAL HIGH (ref 70–99)

## 2019-10-08 LAB — LACTIC ACID, PLASMA: Lactic Acid, Venous: 1.4 mmol/L (ref 0.5–1.9)

## 2019-10-08 LAB — SODIUM, URINE, RANDOM: Sodium, Ur: <10 mmol/L

## 2019-10-08 LAB — CREATININE, URINE, RANDOM: Creatinine, Urine: 49.13 mg/dL

## 2019-10-08 MED ORDER — CARVEDILOL 6.25 MG PO TABS
6.2500 mg | ORAL_TABLET | Freq: Two times a day (BID) | ORAL | 0 refills | Status: DC
Start: 2019-10-08 — End: 2019-10-25

## 2019-10-08 NOTE — TOC Benefit Eligibility Note (Signed)
Transition of Care [facility]) Benefit Eligibility Note    Patient Details  Name: [name] MRN: [id number] Date of Birth: 05-Jul-1927   Medication/Dose: Eliquis 5mg . bid  Covered?: Yes  Tier:  (?)  Prescription Coverage [facility]  Spoke with Person/Company/Phone Number:: Angela L. [place] PH# [phone]  Co-Pay: $133.50  Prior Approval: No  Deductible: Unmet       Shelda Altes Phone Number: 10/08/2019, 10:47 AM

## 2019-10-08 NOTE — Progress Notes (Signed)
SATURATION QUALIFICATIONS: (This note is used to comply with regulatory documentation for home oxygen)  Patient Saturations on Room Air at Rest = 84%  Patient Saturations on Room Air while Ambulating = n/a%  Patient Saturations on n/a Liters of oxygen while Ambulating = n/a%  Please briefly explain why patient needs home oxygen: Pt is non ambulatory.  O2 sat 84% on RA at rest.  91% on O2 2L via [place].  Idolina Primer, RN

## 2019-10-08 NOTE — Progress Notes (Signed)
Patient increasingly lethargic and confused in comparison to last night's shift. Patient's blood pressure and temperature were elevated, and O2 was satting in the low 90s on 2LNC. Dr. Myna Hidalgo was paged and ABG and lactic were ordered. Both resulted within normal limits. Dr. Myna Hidalgo was also made aware that flomax and eliquis were held for tonight, as patient is arousable but will doze off within a few seconds during conversation and there was concern for aspiration.   Elesa Hacker, RN

## 2019-10-08 NOTE — Progress Notes (Signed)
Patient ID: [name], male   DOB: 09-Nov-1927, 86 y.o.   MRN: [id number] S: No events last night O:BP (!) 148/69 (BP Location: Left Arm)   Pulse 60   Temp (!) 97.4 F (36.3 C) (Oral)   Resp (!) 23   Ht 4\' 8"  (1.422 m)   Wt 99.1 kg   SpO2 90%   BMI 48.98 kg/m   Intake/Output Summary (Last 24 hours) at 10/08/2019 1204 Last data filed at 10/07/2019 2200 Gross per 24 hour  Intake --  Output 1400 ml  Net -1400 ml   Intake/Output: I/O last 3 completed shifts: In: 1086.2 [P.O.:240; I.V.:846.2] Out: 2100 [Urine:2100]  Intake/Output this shift:  No intake/output data recorded. Weight change:  Gen: flat affect, minimally conversant  CVS:no rub Resp: CTA Abd: +BS, soft, NT/ND Ext: no edema s/p bilateral BKA's  Recent Labs  Lab 10/04/19 1800 10/05/19 0333 10/06/19 1430 10/07/19 0416 10/08/19 0747  NA 133* 135 131* 133* 129*  K 3.0* 3.5 4.0 4.4 3.8  CL 94* 96* 98 96* 93*  CO2 26 26 25 24 22   GLUCOSE 229* 203* 243* 208* 199*  BUN 39* 38* 42* 46* 45*  CREATININE 2.72* 2.55* 2.67* 2.73* 2.76*  ALBUMIN 3.2*  --   --   --   --   CALCIUM 9.0 9.2 8.9 8.9 8.8*  AST 26  --   --   --   --   ALT 17  --   --   --   --    Liver Function Tests: Recent Labs  Lab 10/04/19 1800  AST 26  ALT 17  ALKPHOS 61  BILITOT 0.6  PROT 7.0  ALBUMIN 3.2*   No results for input(s): LIPASE, AMYLASE in the last 168 hours. No results for input(s): AMMONIA in the last 168 hours. CBC: Recent Labs  Lab 10/04/19 1800 10/04/19 1800 10/05/19 0333 10/05/19 0333 10/06/19 1430 10/07/19 0416 10/08/19 0747  WBC 10.9*   < > 9.7   < > 8.6 10.3 14.0*  HGB 12.7*   < > 13.2   < > 11.6* 11.2* 11.2*  HCT 38.1*   < > 38.7*   < > 34.1* 33.5* 33.6*  MCV 94.1  --  94.6  --  94.7 95.7 94.6  PLT 222   < > 211   < > 183 186 177   < > = values in this interval not displayed.   Cardiac Enzymes: No results for input(s): CKTOTAL, CKMB, CKMBINDEX, TROPONINI in the last 168 hours. CBG: Recent Labs  Lab  10/07/19 1121 10/07/19 1731 10/07/19 2109 10/08/19 0749 10/08/19 1132  GLUCAP 232* 208* 235* 209* 222*    Iron Studies: No results for input(s): IRON, TIBC, TRANSFERRIN, FERRITIN in the last 72 hours. Studies/Results: US RENAL  Result Date: 10/07/2019 CLINICAL DATA:  Acute renal injury EXAM: RENAL / URINARY TRACT ULTRASOUND COMPLETE COMPARISON:  12/16/2011 FINDINGS: Right Kidney: Renal measurements: 8.9 x 3.2 x 3.2 cm. = volume: 47 mL. 1.9 cm cyst is noted in the lower pole new from the prior exam. Diffuse cortical thinning is seen. No hydronephrosis is noted. Left Kidney: Renal measurements: 9.4 x 4.9 x 3.3 cm. = volume: 78.5 mL. Echogenicity within normal limits. No mass or hydronephrosis visualized. Bladder: Decompressed Other: None. IMPRESSION: Right renal cyst. Right-sided cortical thinning.  No new acute abnormality is noted. Electronically Signed   By: Inez Catalina M.D.   On: 10/07/2019 20:45   . amLODipine  2.5 mg Oral Daily  .  apixaban  2.5 mg Oral BID  . atorvastatin  40 mg Oral Daily  . carvedilol  6.25 mg Oral BID WC  . Chlorhexidine Gluconate Cloth  6 each Topical Daily  . clopidogrel  75 mg Oral Daily  . finasteride  5 mg Oral QPM  . furosemide  40 mg Oral Daily  . insulin aspart  0-5 Units Subcutaneous QHS  . insulin aspart  0-9 Units Subcutaneous TID WC  . latanoprost  1 drop Both Eyes QHS  . levothyroxine  75 mcg Oral Daily  . tamsulosin  0.4 mg Oral BID    BMET    Component Value Date/Time   NA 129 (L) 10/08/2019 0747   NA 127 (L) 07/23/2018 1249   K 3.8 10/08/2019 0747   CL 93 (L) 10/08/2019 0747   CO2 22 10/08/2019 0747   GLUCOSE 199 (H) 10/08/2019 0747   BUN 45 (H) 10/08/2019 0747   BUN 26 07/23/2018 1249   CREATININE 2.76 (H) 10/08/2019 0747   CALCIUM 8.8 (L) 10/08/2019 0747   GFRNONAA 19 (L) 10/08/2019 0747   GFRAA 22 (L) 10/08/2019 0747   CBC    Component Value Date/Time   WBC 14.0 (H) 10/08/2019 0747   RBC 3.55 (L) 10/08/2019 0747   HGB 11.2  (L) 10/08/2019 0747   HGB 10.4 (L) 07/23/2018 1249   HCT 33.6 (L) 10/08/2019 0747   HCT 30.4 (L) 07/23/2018 1249   PLT 177 10/08/2019 0747   PLT 255 07/23/2018 1249   MCV 94.6 10/08/2019 0747   MCV 95 07/23/2018 1249   MCH 31.5 10/08/2019 0747   MCHC 33.3 10/08/2019 0747   RDW 14.1 10/08/2019 0747   RDW 12.3 07/23/2018 1249   LYMPHSABS 3.0 02/04/2019 0620   MONOABS 0.6 02/04/2019 0620   EOSABS 0.5 02/04/2019 0620   BASOSABS 0.1 02/04/2019 0620     Assessment/Plan:  1. Non-oliguric, AKI/CKD stage 3a- presumably due to ischemic ATN in setting of NSTEMI, mod-severe AS, A fib, diuretics for CHF, and chronic indwelling foley catheter.  BUN/Cr relatively stable with marked increase in UOP overnight.  He is not a candidate for dialysis given his advanced age, multiple co-morbidities (especially his CAD and mod-severe AS), and poor nutritional and functional status.  Will continue with conservative management.  Family and other medical teams are in agreement.  2. NSTEMI- medical management 3. Moderate to severe Aortic stenosis- medical management 4. Chronic diastolic chf- stable 5. BPH continue with foley 6. HTN- stable 7. Disposition- recommend palliative care consult to help set goals/limits of care as he is still full code.  Donetta Potts, MD [employer] [phone]

## 2019-10-08 NOTE — Progress Notes (Addendum)
Pt's O2 sat dropped to 84% sustained while asleep.  Reapplied 2L 2 via [place].  Currently O2 sat 91% on 2L.  Idolina Primer, RN

## 2019-10-08 NOTE — Progress Notes (Signed)
PROGRESS NOTE    AVYUKT CIMO  IRJ:[id number] DOB: 12-Apr-1927 DOA: 10/04/2019 PCP: Redmond School, MD   Brief Narrative:  [name] is a 86 y.o. male with medical history significant for atrial fibrillation, aortic stenosis, chronic diastolic heart failure, hypertension, diabetes mellitus type 2, peripheral vascular disease, s/p bilateral AKA and has bilateral prosthesis in place, hypothyroidism who presents EMS to the emergency room with complaint of chest pain.  Patient son is at bedside and helps provide history.  Patient returned home from rehab/PT appointment this afternoon around 2:00 pm and developed a substernal chest pressure and tightness in the upper chest.  States he was not doing any particular activity when the chest pressure began.  Reports he did not have chest pressure when he was at his physical therapy appointment earlier in the day.  After sitting and resting it continued to occur.  His son gave him a Pepcid for presumed indigestion but the pain did not improve.  He then developed left shoulder and left upper arm pain and so was brought to the emergency room for further evaluation.  He did not have any shortness of breath, nausea, vomiting or diaphoresis with the episode.  Reports that the pressure was an 8-9/10 at its worst and is currently solved.  He was given aspirin by EMS in route to the hospital.  Denies any complaints at this time. He has 1 son who lives with him during the week and his other children stay with him over the weekend he always has somebody at home with him. Reports he has been compliant with his medications. In ED: [name] has an elevated troponin level in the emergency room.  Repeat troponin shows an increase from the initial level around 100 to over 500.  His EKG shows no ST elevation.  He is found to have AKI superimposed on his chronic kidney disease.  ER provider discussed with cardiology who is going to see patient in the emergency room and provide  recommendations. Hospitalist service is asked to evaluate and help manage patient  Assessment & Plan:   Principal Problem:   NSTEMI (non-ST elevated myocardial infarction) ([place]) Active Problems:   Essential hypertension, benign   Moderate aortic stenosis   Chronic diastolic CHF (congestive heart failure) (HCC)   Atrial fibrillation, chronic   Type 2 diabetes mellitus with vascular disease (HCC)   S/P BKA (below knee amputation) unilateral, right (HCC)   Acute kidney injury superimposed on CKD ([place])   Hypokalemia   NSTEMI (non-ST elevated myocardial infarction) ([place]), unspecified, POA - Cardiology continues to follow -appreciate insight and recommendations - Heparin drip off, resume Eliquis and Plavix, discontinue aspirin - Lengthy discussion daily at bedside with patient and his son that medical management would likely be in the best interest given patient's minimal symptoms, essentially nonambulatory status very minimal exertion and risk given age and comorbidities as below -Somewhat limited in medical therapy options given patient's aortic stenosis, CKD which limits nitrates and Ranexa.  Continue to titrate beta-blocker along with cardiology -Continue statin therapy and close monitoring  Acute kidney injury superimposed on CKD 2 (HCC) -Creatinine somewhat labile but stable over the past 72h, likely his new baseline -Encouraged increased p.o. intake of free water  Acute hypoxic respiratory failure -Unclear etiology - possibly from volume overload given new worsening kidney function (likely new baseline) and volume overload -Hold diuretics given creatinine -Continue supplemental oxygen as necessary to keep O2 >88%  Somnolence, rule out encephalopathy -Poor sleep schedule overnight -  Concern for sundowning/hospital delirium -Monitor closely; family at bedside notes "he's very sleep and sluggish to answer questions - unlike himself"  Essential hypertension - Continue patient's  home dose of Norvasc.  We will add low-dose Coreg 3.125 mg twice   Hypokalemia  WNL  S/P BKA (below knee amputation) bilateral.  DVT prophylaxis: Eliquis Code Status: Full Family Communication: Daughter at bedside  Status is: Patient  Dispo: The patient is from: Home              Anticipated d/c is to: To be determined              Anticipated d/c date is: 24-48 hours pending clinical course              Patient currently not medically stable for discharge due to ongoing need for further evaluation and medication management  Consultants:   Cardiology  Procedures:   None planned  Antimicrobials:  None indicated  Subjective: Noted to be somewhat hypoxic overnight - on 2L [place]; somnolent but easy to arouse - AOx4 but slow to answer. Admits to poor sleep overnight but denies chest pain, shortness of breath, headaches, fevers or chills.  Objective: Vitals:   10/08/19 1038 10/08/19 1135 10/08/19 1300 10/08/19 1619  BP: (!) 148/72 (!) 148/69  (!) 154/75  Pulse:  60 62 64  Resp:  (!) 23 (!) 22 19  Temp:  (!) 97.4 F (36.3 C)  99.1 F (37.3 C)  TempSrc:  Oral  Oral  SpO2:  90% 92% 92%  Weight:      Height:        Intake/Output Summary (Last 24 hours) at 10/08/2019 1828 Last data filed at 10/08/2019 1600 Gross per 24 hour  Intake 240 ml  Output 2650 ml  Net -2410 ml   Filed Weights   10/05/19 0452 10/06/19 0420 10/07/19 0616  Weight: 94.2 kg 98.7 kg 99.1 kg    Examination:  General:  Pleasantly resting in bed, No acute distress. HEENT:  Normocephalic atraumatic.  Sclerae nonicteric, noninjected.  Extraocular movements intact bilaterally. Neck:  Without mass or deformity.  Trachea is midline. Lungs:  Clear to auscultate bilaterally without rhonchi, wheeze, or rales. Heart:  Regular rate and rhythm.  Blowing holosystolic murmur without overt rubs or gallops Abdomen:  Soft, nontender, nondistended.  Without guarding or rebound. Extremities: Without cyanosis,  clubbing, edema, or obvious deformity.  Bilateral lower extremity BKA Vascular:  Dorsalis pedis and posterior tibial pulses palpable bilaterally. Skin:  Warm and dry, no erythema, no rashes.   Data Reviewed: I have personally reviewed following labs and imaging studies  CBC: Recent Labs  Lab 10/04/19 1800 10/05/19 0333 10/06/19 1430 10/07/19 0416 10/08/19 0747  WBC 10.9* 9.7 8.6 10.3 14.0*  HGB 12.7* 13.2 11.6* 11.2* 11.2*  HCT 38.1* 38.7* 34.1* 33.5* 33.6*  MCV 94.1 94.6 94.7 95.7 94.6  PLT 222 211 183 186 132   Basic Metabolic Panel: Recent Labs  Lab 10/04/19 1800 10/04/19 1910 10/05/19 0333 10/06/19 1430 10/07/19 0416 10/08/19 0747  NA 133*  --  135 131* 133* 129*  K 3.0*  --  3.5 4.0 4.4 3.8  CL 94*  --  96* 98 96* 93*  CO2 26  --  26 25 24 22   GLUCOSE 229*  --  203* 243* 208* 199*  BUN 39*  --  38* 42* 46* 45*  CREATININE 2.72*  --  2.55* 2.67* 2.73* 2.76*  CALCIUM 9.0  --  9.2 8.9 8.9 8.8*  MG  --  2.5*  --   --   --   --    GFR: Estimated Creatinine Clearance: 16.1 mL/min (A) (by C-G formula based on SCr of 2.76 mg/dL (H)). Liver Function Tests: Recent Labs  Lab 10/04/19 1800  AST 26  ALT 17  ALKPHOS 61  BILITOT 0.6  PROT 7.0  ALBUMIN 3.2*   No results for input(s): LIPASE, AMYLASE in the last 168 hours. No results for input(s): AMMONIA in the last 168 hours. Coagulation Profile: No results for input(s): INR, PROTIME in the last 168 hours. Cardiac Enzymes: No results for input(s): CKTOTAL, CKMB, CKMBINDEX, TROPONINI in the last 168 hours. BNP (last 3 results) No results for input(s): PROBNP in the last 8760 hours. HbA1C: No results for input(s): HGBA1C in the last 72 hours. CBG: Recent Labs  Lab 10/07/19 1731 10/07/19 2109 10/08/19 0749 10/08/19 1132 10/08/19 1618  GLUCAP 208* 235* 209* 222* 224*   Lipid Profile: No results for input(s): CHOL, HDL, LDLCALC, TRIG, CHOLHDL, LDLDIRECT in the last 72 hours. Thyroid Function Tests: No  results for input(s): TSH, T4TOTAL, FREET4, T3FREE, THYROIDAB in the last 72 hours. Anemia Panel: No results for input(s): VITAMINB12, FOLATE, FERRITIN, TIBC, IRON, RETICCTPCT in the last 72 hours. Sepsis Labs: No results for input(s): PROCALCITON, LATICACIDVEN in the last 168 hours.  Recent Results (from the past 240 hour(s))  SARS Coronavirus 2 by RT PCR (hospital order, performed in [facility] hospital lab) Nasopharyngeal Nasopharyngeal Swab     Status: None   Collection Time: 10/05/19 12:27 AM   Specimen: Nasopharyngeal Swab  Result Value Ref Range Status   SARS Coronavirus 2 NEGATIVE NEGATIVE Final    Comment: (NOTE) SARS-CoV-2 target nucleic acids are NOT DETECTED.  The SARS-CoV-2 RNA is generally detectable in upper and lower respiratory specimens during the acute phase of infection. The lowest concentration of SARS-CoV-2 viral copies this assay can detect is 250 copies / mL. A negative result does not preclude SARS-CoV-2 infection and should not be used as the sole basis for treatment or other patient management decisions.  A negative result may occur with improper specimen collection / handling, submission of specimen other than nasopharyngeal swab, presence of viral mutation(s) within the areas targeted by this assay, and inadequate number of viral copies (<250 copies / mL). A negative result must be combined with clinical observations, patient history, and epidemiological information.  Fact Sheet for Patients:   StrictlyIdeas.no  Fact Sheet for Healthcare Providers: BankingDealers.co.za  This test is not yet approved or  cleared by the [place] FDA and has been authorized for detection and/or diagnosis of SARS-CoV-2 by FDA under an Emergency Use Authorization (EUA).  This EUA will remain in effect (meaning this test can be used) for the duration of the COVID-19 declaration under Section 564(b)(1) of the Act, 21  U.S.C. section 360bbb-3(b)(1), unless the authorization is terminated or revoked sooner.  Performed at [facility], [place] [address], [place], [place] [address]    Radiology Studies: US RENAL  Result Date: 10/07/2019 CLINICAL DATA:  Acute renal injury EXAM: RENAL / URINARY TRACT ULTRASOUND COMPLETE COMPARISON:  12/16/2011 FINDINGS: Right Kidney: Renal measurements: 8.9 x 3.2 x 3.2 cm. = volume: 47 mL. 1.9 cm cyst is noted in the lower pole new from the prior exam. Diffuse cortical thinning is seen. No hydronephrosis is noted. Left Kidney: Renal measurements: 9.4 x 4.9 x 3.3 cm. = volume: 78.5 mL. Echogenicity within normal limits. No mass or hydronephrosis visualized.  Bladder: Decompressed Other: None. IMPRESSION: Right renal cyst. Right-sided cortical thinning.  No new acute abnormality is noted. Electronically Signed   By: Inez Catalina M.D.   On: 10/07/2019 20:45     Scheduled Meds:  amLODipine  2.5 mg Oral Daily   apixaban  2.5 mg Oral BID   atorvastatin  40 mg Oral Daily   carvedilol  6.25 mg Oral BID WC   Chlorhexidine Gluconate Cloth  6 each Topical Daily   clopidogrel  75 mg Oral Daily   finasteride  5 mg Oral QPM   furosemide  40 mg Oral Daily   insulin aspart  0-5 Units Subcutaneous QHS   insulin aspart  0-9 Units Subcutaneous TID WC   latanoprost  1 drop Both Eyes QHS   levothyroxine  75 mcg Oral Daily   tamsulosin  0.4 mg Oral BID   Continuous Infusions:    LOS: 3 days   Time spent: 49min  Travis C Manav Pierotti, DO [facility]  If 7PM-7AM, please contact night-coverage [email or web address]  10/08/2019, 6:28 PM

## 2019-10-08 NOTE — Care Management (Signed)
Benefit check requested for Brilinta 5 mg BID

## 2019-10-08 NOTE — Progress Notes (Addendum)
Progress Note  Patient Name: [name] Date of Encounter: 10/08/2019  Primary Cardiologist: Pixie Casino, MD  Subjective   Same answers as yesterday. Denies chest pain. When asked how he feels otherwise states "I don't know." Does not really offer conversation unless spoken to.  Inpatient Medications    Scheduled Meds: . amLODipine  2.5 mg Oral Daily  . apixaban  2.5 mg Oral BID  . atorvastatin  40 mg Oral Daily  . carvedilol  6.25 mg Oral BID WC  . Chlorhexidine Gluconate Cloth  6 each Topical Daily  . clopidogrel  75 mg Oral Daily  . finasteride  5 mg Oral QPM  . furosemide  40 mg Oral Daily  . insulin aspart  0-5 Units Subcutaneous QHS  . insulin aspart  0-9 Units Subcutaneous TID WC  . latanoprost  1 drop Both Eyes QHS  . levothyroxine  75 mcg Oral Daily  . tamsulosin  0.4 mg Oral BID   Continuous Infusions:  PRN Meds: acetaminophen   Vital Signs    Vitals:   10/07/19 2008 10/07/19 2337 10/08/19 0620 10/08/19 0801  BP: (!) 161/90 (!) 145/79 (!) 155/74 (!) 157/77  Pulse: 66 65 65 62  Resp: (!) 21 (!) 25 19 20   Temp: 98.3 F (36.8 C) 98.7 F (37.1 C) 98.9 F (37.2 C) 99.1 F (37.3 C)  TempSrc: Oral Oral Oral Oral  SpO2: 90% 90% 90% 91%  Weight:      Height:        Intake/Output Summary (Last 24 hours) at 10/08/2019 0854 Last data filed at 10/07/2019 2200 Gross per 24 hour  Intake --  Output 2100 ml  Net -2100 ml   Last 3 Weights 10/07/2019 10/06/2019 10/05/2019  Weight (lbs) 218 lb 7.6 oz 217 lb 9.5 oz 207 lb 10.8 oz  Weight (kg) 99.1 kg 98.7 kg 94.2 kg     Telemetry    NSR/SB with first degree AVB - Personally Reviewed  Physical Exam   GEN: No acute distress.  HEENT: Normocephalic, atraumatic, sclera non-icteric. Neck: No JVD or bruits. Cardiac: RRR, 2/6 SEM heard over entire precordium (correction to note yesterday as well), no rubs or gallops.  Respiratory: Diminished throughout bilaterally, poor inspiratory effort. Breathing is  unlabored. GI: Soft, nontender, non-distended, BS +x 4. MS: S/p bilateral lower extremity amputation Extremities: No clubbing or cyanosis. No edema. S/p bilateral lower extremity amputation. Neuro:  A+O to self, place but not date. Follows commands. Psych:  Pleasant affect but not very voluntarily conversant  Labs    High Sensitivity Troponin:   Recent Labs  Lab 10/04/19 1800 10/04/19 1910  TROPONINIHS 272* 572*   Chemistry Recent Labs  Lab 10/04/19 1800 10/04/19 1800 10/05/19 0333 10/06/19 1430 10/07/19 0416  NA 133*   < > 135 131* 133*  K 3.0*   < > 3.5 4.0 4.4  CL 94*   < > 96* 98 96*  CO2 26   < > 26 25 24   GLUCOSE 229*   < > 203* 243* 208*  BUN 39*   < > 38* 42* 46*  CREATININE 2.72*   < > 2.55* 2.67* 2.73*  CALCIUM 9.0   < > 9.2 8.9 8.9  PROT 7.0  --   --   --   --   ALBUMIN 3.2*  --   --   --   --   AST 26  --   --   --   --   ALT  17  --   --   --   --   ALKPHOS 61  --   --   --   --   BILITOT 0.6  --   --   --   --   GFRNONAA 20*   < > 21* 20* 20*  GFRAA 23*   < > 25* 23* 23*  ANIONGAP 13   < > 13 8 13    < > = values in this interval not displayed.     Hematology Recent Labs  Lab 10/05/19 0333 10/06/19 1430 10/07/19 0416  WBC 9.7 8.6 10.3  RBC 4.09* 3.60* 3.50*  HGB 13.2 11.6* 11.2*  HCT 38.7* 34.1* 33.5*  MCV 94.6 94.7 95.7  MCH 32.3 32.2 32.0  MCHC 34.1 34.0 33.4  RDW 14.3 14.6 14.6  PLT 211 183 186    Radiology    US RENAL  Result Date: 10/07/2019 CLINICAL DATA:  Acute renal injury EXAM: RENAL / URINARY TRACT ULTRASOUND COMPLETE COMPARISON:  12/16/2011 FINDINGS: Right Kidney: Renal measurements: 8.9 x 3.2 x 3.2 cm. = volume: 47 mL. 1.9 cm cyst is noted in the lower pole new from the prior exam. Diffuse cortical thinning is seen. No hydronephrosis is noted. Left Kidney: Renal measurements: 9.4 x 4.9 x 3.3 cm. = volume: 78.5 mL. Echogenicity within normal limits. No mass or hydronephrosis visualized. Bladder: Decompressed Other: None.  IMPRESSION: Right renal cyst. Right-sided cortical thinning.  No new acute abnormality is noted. Electronically Signed   By: Inez Catalina M.D.   On: 10/07/2019 20:45    Cardiac Studies   2D Echo 09/04/19 1. Left ventricular ejection fraction, by estimation, is 55 to 60%. The  left ventricle has normal function. The left ventricle has no regional  wall motion abnormalities. There is moderate left ventricular hypertrophy.  Left ventricular diastolic  parameters are consistent with Grade I diastolic dysfunction (impaired  relaxation). Elevated left atrial pressure.  2. Right ventricular systolic function is normal. The right ventricular  size is normal.  3. The mitral valve is normal in structure. No evidence of mitral valve  regurgitation. No evidence of mitral stenosis.  4. The aortic valve has an indeterminant number of cusps. Aortic valve  regurgitation is not visualized. Moderate to severe aortic valve  stenosis.Aortic valve mean gradient measures 27.4 mmHg. Aortic valve peak  gradient measures 44.6 mmHg. Aortic valve  area, by VTI measures 0.77 cm.   Patient Profile     86 y.o.malewith history ofHTN, HLD, PAD s/p bilateral amputation,chronicdiastolic HF, moderate - severe AS, CKD stage III, paroxysmal atrial fibrillationonEliquis, sleep apnea (does not use CPAP), DM, RBBB/bifascicular block, bradycardiaadmitted 10/04/19 with chest pain/"indigestion," found to have NSTEMI and AKI on CKD.  Assessment & Plan    1. NSTEMI - case discussed with interventional team per notes - given complexity, comorbidities and kidney failure, medical therapy recommended since he is high risk for invasive evaluation - aspirin discontinued given concomitant Eliquis - continue Plavix - continue BB, statin - do not see plans for echocardiogram this admission since it would not acutely change management   2. AKI on CKD stage III - prior baseline 1.3, with admit Cr 2.7 and remaining similar -  avoid nephrotoxic agents such as ACEI/ARB/ARNI/spironolactone - nephrology following, await repeat labs today  3. Moderate-severe aortic stenosis by echocardiogram(08/2019) - plan medical therapy for now as he is not a candidate for the workup involved in pre-TAVR planning such as cath and CTs due to worsening renal failure  4. Acute on chronic diastolic CHF - Lasix on hold due to AKI, received IVF this admission - now with borderline O2 sats at times requiring supplemental O2 to maintain - Lasix resumed 10/07/19, awaiting labs and f/u weight  5. PAF - in AF on admission which could be contributing to symptoms, converted to NSR with 4.3sec post-termination pause then junctional brady followed by sinus brady after carvedilol 12.5mg  dose - continue carvedilol at 6.25mg  BID - Eliquis resumed cautiously with kidney issues  6. Essential HTN - BP range HTN at times, consider titration of amlodipine pending d/w MD  7. Poorly controlled DM - A1C 8.5 -> per IM  Not sure if it has been suggested earlier this admission but consider [place] discussions.  For questions or updates, please contact [place] Please consult [email or web address] for contact info under Cardiology/STEMI.  Signed, Charlie Pitter, PA-C 10/08/2019, 8:54 AM

## 2019-10-08 NOTE — Consult Note (Signed)
   [facility] Inpatient Consult   10/08/2019  BRAX WALEN 06-27-1927 [id number]   [place] [facility] [ACO] Patient: [employer]    Patient active with [place] [facility] for chronic disease management services.  Patient has been engaged by a [place].  Our community based plan of care has focused on disease management and community resource support.  Patient with extreme high risk for unplanned readmission scores.  Patient will receive a post hospital call and will be evaluated for assessments and disease process education.    Plan: Will assign patient by to [place] for [place] hospital follow up for continuation of transition of care needs.  Came by to speak with  Inpatient Transition Of Care [TOC] team member to make aware that [place] Management following, they were on a conference call. Of note, [facility] Care Management services does not replace or interfere with any services that are needed or arranged by inpatient [facility] care management team.  For additional questions or referrals please contact:  Natividad Brood, RN BSN [facility] Liaison  [phone] business mobile phone Toll free office [phone]  Fax number: [phone] [email or web address]

## 2019-10-09 ENCOUNTER — Inpatient Hospital Stay (HOSPITAL_COMMUNITY): Payer: Medicare Other

## 2019-10-09 DIAGNOSIS — Z7189 Other specified counseling: Secondary | ICD-10-CM

## 2019-10-09 DIAGNOSIS — Z515 Encounter for palliative care: Secondary | ICD-10-CM

## 2019-10-09 LAB — BASIC METABOLIC PANEL
Anion gap: 12 (ref 5–15)
BUN: 50 mg/dL — ABNORMAL HIGH (ref 8–23)
CO2: 26 mmol/L (ref 22–32)
Calcium: 8.7 mg/dL — ABNORMAL LOW (ref 8.9–10.3)
Chloride: 94 mmol/L — ABNORMAL LOW (ref 98–111)
Creatinine, Ser: 3 mg/dL — ABNORMAL HIGH (ref 0.61–1.24)
GFR calc Af Amer: 20 mL/min — ABNORMAL LOW (ref >60)
GFR calc non Af Amer: 17 mL/min — ABNORMAL LOW (ref >60)
Glucose, Bld: 198 mg/dL — ABNORMAL HIGH (ref 70–99)
Potassium: 3.7 mmol/L (ref 3.5–5.1)
Sodium: 132 mmol/L — ABNORMAL LOW (ref 135–145)

## 2019-10-09 LAB — GLUCOSE, CAPILLARY
Glucose-Capillary: 190 mg/dL — ABNORMAL HIGH (ref 70–99)
Glucose-Capillary: 217 mg/dL — ABNORMAL HIGH (ref 70–99)
Glucose-Capillary: 215 mg/dL — ABNORMAL HIGH (ref 70–99)
Glucose-Capillary: 252 mg/dL — ABNORMAL HIGH (ref 70–99)

## 2019-10-09 LAB — CBC
HCT: 31 % — ABNORMAL LOW (ref 39.0–52.0)
Hemoglobin: 10.5 g/dL — ABNORMAL LOW (ref 13.0–17.0)
MCH: 31.9 pg (ref 26.0–34.0)
MCHC: 33.9 g/dL (ref 30.0–36.0)
MCV: 94.2 fL (ref 80.0–100.0)
Platelets: 165 10*3/uL (ref 150–400)
RBC: 3.29 MIL/uL — ABNORMAL LOW (ref 4.22–5.81)
RDW: 14.1 % (ref 11.5–15.5)
WBC: 12.2 10*3/uL — ABNORMAL HIGH (ref 4.0–10.5)
nRBC: 0 % (ref 0.0–0.2)

## 2019-10-09 MED ORDER — SODIUM CHLORIDE 0.9 % IV SOLN
1.0000 g | INTRAVENOUS | Status: AC
  Administered 2019-10-09 – 2019-10-11 (×3): 1 g via INTRAVENOUS
  Filled 2019-10-09 (×3): qty 10

## 2019-10-09 NOTE — Progress Notes (Signed)
Patient ID: [name], male   DOB: [birth year], 86 y.o.   MRN: [id number] S: No events overnight O:BP (!) 153/60 (BP Location: Right Arm)    Pulse (!) 55    Temp (!) 97.4 F (36.3 C) (Oral)    Resp 19    Ht 4\' 8"  (1.422 m)    Wt 98.7 kg    SpO2 (!) 88%    BMI 48.78 kg/m   Intake/Output Summary (Last 24 hours) at 10/09/2019 1110 Last data filed at 10/09/2019 0327 Gross per 24 hour  Intake --  Output 2500 ml  Net -2500 ml   Intake/Output: I/O last 3 completed shifts: In: 240 [P.O.:240] Out: 3900 [Urine:3900]  Intake/Output this shift:  No intake/output data recorded. Weight change:  Gen: NAD, eating breakfast in bed CVS: bradycardic at 57 Resp: cta Abd: benign Ext: s/p Bilateral BKA's, no edema  Recent Labs  Lab 10/04/19 1800 10/05/19 0333 10/06/19 1430 10/07/19 0416 10/08/19 0747 10/09/19 0732  NA 133* 135 131* 133* 129* 132*  K 3.0* 3.5 4.0 4.4 3.8 3.7  CL 94* 96* 98 96* 93* 94*  CO2 26 26 25 24 22 26   GLUCOSE 229* 203* 243* 208* 199* 198*  BUN 39* 38* 42* 46* 45* 50*  CREATININE 2.72* 2.55* 2.67* 2.73* 2.76* 3.00*  ALBUMIN 3.2*  --   --   --   --   --   CALCIUM 9.0 9.2 8.9 8.9 8.8* 8.7*  AST 26  --   --   --   --   --   ALT 17  --   --   --   --   --    Liver Function Tests: Recent Labs  Lab 10/04/19 1800  AST 26  ALT 17  ALKPHOS 61  BILITOT 0.6  PROT 7.0  ALBUMIN 3.2*   No results for input(s): LIPASE, AMYLASE in the last 168 hours. No results for input(s): AMMONIA in the last 168 hours. CBC: Recent Labs  Lab 10/05/19 0333 10/05/19 0333 10/06/19 1430 10/06/19 1430 10/07/19 0416 10/08/19 0747 10/09/19 0732  WBC 9.7   < > 8.6   < > 10.3 14.0* 12.2*  HGB 13.2   < > 11.6*   < > 11.2* 11.2* 10.5*  HCT 38.7*   < > 34.1*   < > 33.5* 33.6* 31.0*  MCV 94.6  --  94.7  --  95.7 94.6 94.2  PLT 211   < > 183   < > 186 177 165   < > = values in this interval not displayed.   Cardiac Enzymes: No results for input(s): CKTOTAL, CKMB, CKMBINDEX, TROPONINI  in the last 168 hours. CBG: Recent Labs  Lab 10/08/19 1132 10/08/19 1618 10/08/19 2119 10/09/19 0728 10/09/19 1103  GLUCAP 222* 224* 261* 190* 217*    Iron Studies: No results for input(s): IRON, TIBC, TRANSFERRIN, FERRITIN in the last 72 hours. Studies/Results: US RENAL  Result Date: 10/07/2019 CLINICAL DATA:  Acute renal injury EXAM: RENAL / URINARY TRACT ULTRASOUND COMPLETE COMPARISON:  12/16/2011 FINDINGS: Right Kidney: Renal measurements: 8.9 x 3.2 x 3.2 cm. = volume: 47 mL. 1.9 cm cyst is noted in the lower pole new from the prior exam. Diffuse cortical thinning is seen. No hydronephrosis is noted. Left Kidney: Renal measurements: 9.4 x 4.9 x 3.3 cm. = volume: 78.5 mL. Echogenicity within normal limits. No mass or hydronephrosis visualized. Bladder: Decompressed Other: None. IMPRESSION: Right renal cyst. Right-sided cortical thinning.  No new acute abnormality is  noted. Electronically Signed   By: Inez Catalina M.D.   On: 10/07/2019 20:45    amLODipine  2.5 mg Oral Daily   apixaban  2.5 mg Oral BID   atorvastatin  40 mg Oral Daily   carvedilol  6.25 mg Oral BID WC   Chlorhexidine Gluconate Cloth  6 each Topical Daily   clopidogrel  75 mg Oral Daily   finasteride  5 mg Oral QPM   furosemide  40 mg Oral Daily   insulin aspart  0-5 Units Subcutaneous QHS   insulin aspart  0-9 Units Subcutaneous TID WC   latanoprost  1 drop Both Eyes QHS   levothyroxine  75 mcg Oral Daily   tamsulosin  0.4 mg Oral BID    BMET    Component Value Date/Time   NA 132 (L) 10/09/2019 0732   NA 127 (L) 07/23/2018 1249   K 3.7 10/09/2019 0732   CL 94 (L) 10/09/2019 0732   CO2 26 10/09/2019 0732   GLUCOSE 198 (H) 10/09/2019 0732   BUN 50 (H) 10/09/2019 0732   BUN 26 07/23/2018 1249   CREATININE 3.00 (H) 10/09/2019 0732   CALCIUM 8.7 (L) 10/09/2019 0732   GFRNONAA 17 (L) 10/09/2019 0732   GFRAA 20 (L) 10/09/2019 0732   CBC    Component Value Date/Time   WBC 12.2 (H) 10/09/2019  0732   RBC 3.29 (L) 10/09/2019 0732   HGB 10.5 (L) 10/09/2019 0732   HGB 10.4 (L) 07/23/2018 1249   HCT 31.0 (L) 10/09/2019 0732   HCT 30.4 (L) 07/23/2018 1249   PLT 165 10/09/2019 0732   PLT 255 07/23/2018 1249   MCV 94.2 10/09/2019 0732   MCV 95 07/23/2018 1249   MCH 31.9 10/09/2019 0732   MCHC 33.9 10/09/2019 0732   RDW 14.1 10/09/2019 0732   RDW 12.3 07/23/2018 1249   LYMPHSABS 3.0 02/04/2019 0620   MONOABS 0.6 02/04/2019 0620   EOSABS 0.5 02/04/2019 0620   BASOSABS 0.1 02/04/2019 0620     Assessment/Plan:  1. Non-oliguric, AKI/CKD stage 3a- presumably due to ischemic ATN in setting of NSTEMI, mod-severe AS, A fib, diuretics for CHF, and chronic indwelling foley catheter.  BUN/Cr relatively stable with marked increase in UOP overnight.  He is not a candidate for dialysis given his advanced age, multiple co-morbidities (especially his CAD and mod-severe AS), and poor nutritional and functional status.  Will continue with conservative management.  Family and other medical teams are in agreement.   1. Has had significant diuresis.  Would recommend holding diuretics for 24 hours and follow if ok with Cardiology 2. NSTEMI- medical management 3. Moderate to severe Aortic stenosis- medical management 4. Chronic diastolic chf- stable 5. BPH continue with foley 6. HTN- stable 7. Disposition- recommend palliative care consult to help set goals/limits of care as he is still full code.  Donetta Potts, MD [employer] [phone]

## 2019-10-09 NOTE — Progress Notes (Addendum)
PROGRESS NOTE    KALYB PEMBLE  VQM:[id number] DOB: May 13, 1927 DOA: 10/04/2019 PCP: Redmond School, MD   Brief Narrative:  SADAT SLIWA is a 86 y.o. male with medical history significant for atrial fibrillation, aortic stenosis, chronic diastolic heart failure, hypertension, diabetes mellitus type 2, peripheral vascular disease, s/p bilateral AKA and has bilateral prosthesis in place, hypothyroidism who presents EMS to the emergency room with complaint of chest pain.  Patient son is at bedside and helps provide history.  Patient returned home from rehab/PT appointment this afternoon around 2:00 pm and developed a substernal chest pressure and tightness in the upper chest.  States he was not doing any particular activity when the chest pressure began.  Reports he did not have chest pressure when he was at his physical therapy appointment earlier in the day.  After sitting and resting it continued to occur.  His son gave him a Pepcid for presumed indigestion but the pain did not improve.  He then developed left shoulder and left upper arm pain and so was brought to the emergency room for further evaluation.  He did not have any shortness of breath, nausea, vomiting or diaphoresis with the episode.  Reports that the pressure was an 8-9/10 at its worst and is currently solved.  He was given aspirin by EMS in route to the hospital.  Denies any complaints at this time. He has 1 son who lives with him during the week and his other children stay with him over the weekend he always has somebody at home with him. Reports he has been compliant with his medications. In ED: Mr. Drummond has an elevated troponin level in the emergency room.  Repeat troponin shows an increase from the initial level around 100 to over 500.  His EKG shows no ST elevation.  He is found to have AKI superimposed on his chronic kidney disease.  ER provider discussed with cardiology who is going to see patient in the emergency room and provide  recommendations. Hospitalist service is asked to evaluate and help manage patient  Assessment & Plan:   Principal Problem:   NSTEMI (non-ST elevated myocardial infarction) ([place]) Active Problems:   Essential hypertension, benign   Moderate aortic stenosis   Chronic diastolic CHF (congestive heart failure) (HCC)   Atrial fibrillation, chronic   Type 2 diabetes mellitus with vascular disease (HCC)   S/P BKA (below knee amputation) unilateral, right (HCC)   Acute kidney injury superimposed on CKD ([place])   Hypokalemia   NSTEMI (non-ST elevated myocardial infarction) ([place]), unspecified, POA - Cardiology continues to follow -appreciate insight and recommendations - Heparin drip off, resume Eliquis and Plavix, discontinue aspirin - Lengthy discussion daily at bedside with patient and his son that medical management would likely be in the best interest given patient's minimal symptoms, essentially nonambulatory status very minimal exertion and risk given age and comorbidities as below -Somewhat limited in medical therapy options given patient's aortic stenosis, CKD which limits nitrates and Ranexa.  Continue to titrate beta-blocker along with cardiology -Continue statin therapy and close monitoring  Acute kidney injury superimposed on CKD 2 (HCC) -Creatinine somewhat labile but stable over the past 72h, likely his new baseline -Encouraged increased p.o. intake of free water  Acute hypoxic respiratory failure in the setting of volume overload - Likely volume overload given new worsening kidney function (likely new baseline) and previous IV fluids - Hold diuretics given creatinine - continues to have adequate UOP - Continue supplemental oxygen as necessary to  keep O2 >88%   UTI with transient metabolic encephalopathy, improving, POA - PCP indicates UA/urine culture obtained prior to admission shows infection per family at bedside who were just updated late yesterday - Continue  ceftriaxone - Mental status already improving from yesterday  Essential hypertension - Continue patient's home dose of Norvasc.  We will add low-dose Coreg 3.125 mg twice   Hypokalemia  WNL  S/P BKA (below knee amputation) bilateral.  DVT prophylaxis: Eliquis Code Status: Full Family Communication: Daughter at bedside  Status is: Patient  Dispo: The patient is from: Home              Anticipated d/c is to: To be determined -likely discharge home              Anticipated d/c date is: 24-48 hours pending clinical course              Patient currently not medically stable for discharge due to ongoing need for further evaluation and medication management  Consultants:   Cardiology  Procedures:   None planned  Antimicrobials:  Ceftriaxone x3 days  Subjective: Continues to be hypoxic, somewhat altered overnight but markedly improving this morning.  Patient somewhat sluggish to answer questions but denies any chest pain shortness of breath nausea vomiting diarrhea constipation headache fevers or chills.  Objective: Vitals:   10/09/19 0000 10/09/19 0325 10/09/19 0500 10/09/19 0730  BP: (!) 144/68 (!) 153/66  (!) 159/66  Pulse: 60 (!) 59  (!) 58  Resp: (!) 21 20  20   Temp: 99 F (37.2 C) 99.3 F (37.4 C)  98.2 F (36.8 C)  TempSrc: Axillary Axillary  Oral  SpO2: 93% 92%  90%  Weight:   98.7 kg   Height:        Intake/Output Summary (Last 24 hours) at 10/09/2019 0800 Last data filed at 10/09/2019 0327 Gross per 24 hour  Intake 240 ml  Output 2500 ml  Net -2260 ml   Filed Weights   10/06/19 0420 10/07/19 0616 10/09/19 0500  Weight: 98.7 kg 99.1 kg 98.7 kg    Examination:  General:  Pleasantly resting in bed, No acute distress.  More awake alert this morning than previous HEENT:  Normocephalic atraumatic.  Sclerae nonicteric, noninjected.  Extraocular movements intact bilaterally. Neck:  Without mass or deformity.  Trachea is midline. Lungs:  Clear to  auscultate bilaterally without rhonchi, wheeze, or rales. Heart:  Regular rate and rhythm.  Blowing holosystolic murmur without overt rubs or gallops Abdomen:  Soft, nontender, nondistended.  Without guarding or rebound. Extremities: Without cyanosis, clubbing, edema, or obvious deformity.  Bilateral lower extremity BKA Vascular:  Dorsalis pedis and posterior tibial pulses palpable bilaterally. Skin:  Warm and dry, no erythema, no rashes.   Data Reviewed: I have personally reviewed following labs and imaging studies  CBC: Recent Labs  Lab 10/04/19 1800 10/05/19 0333 10/06/19 1430 10/07/19 0416 10/08/19 0747  WBC 10.9* 9.7 8.6 10.3 14.0*  HGB 12.7* 13.2 11.6* 11.2* 11.2*  HCT 38.1* 38.7* 34.1* 33.5* 33.6*  MCV 94.1 94.6 94.7 95.7 94.6  PLT 222 211 183 186 619   Basic Metabolic Panel: Recent Labs  Lab 10/04/19 1800 10/04/19 1910 10/05/19 0333 10/06/19 1430 10/07/19 0416 10/08/19 0747  NA 133*  --  135 131* 133* 129*  K 3.0*  --  3.5 4.0 4.4 3.8  CL 94*  --  96* 98 96* 93*  CO2 26  --  26 25 24 22   GLUCOSE 229*  --  203* 243* 208* 199*  BUN 39*  --  38* 42* 46* 45*  CREATININE 2.72*  --  2.55* 2.67* 2.73* 2.76*  CALCIUM 9.0  --  9.2 8.9 8.9 8.8*  MG  --  2.5*  --   --   --   --    GFR: Estimated Creatinine Clearance: 16.1 mL/min (A) (by C-G formula based on SCr of 2.76 mg/dL (H)). Liver Function Tests: Recent Labs  Lab 10/04/19 1800  AST 26  ALT 17  ALKPHOS 61  BILITOT 0.6  PROT 7.0  ALBUMIN 3.2*   No results for input(s): LIPASE, AMYLASE in the last 168 hours. No results for input(s): AMMONIA in the last 168 hours. Coagulation Profile: No results for input(s): INR, PROTIME in the last 168 hours. Cardiac Enzymes: No results for input(s): CKTOTAL, CKMB, CKMBINDEX, TROPONINI in the last 168 hours. BNP (last 3 results) No results for input(s): PROBNP in the last 8760 hours. HbA1C: No results for input(s): HGBA1C in the last 72 hours. CBG: Recent Labs  Lab  10/08/19 0749 10/08/19 1132 10/08/19 1618 10/08/19 2119 10/09/19 0728  GLUCAP 209* 222* 224* 261* 190*   Lipid Profile: No results for input(s): CHOL, HDL, LDLCALC, TRIG, CHOLHDL, LDLDIRECT in the last 72 hours. Thyroid Function Tests: No results for input(s): TSH, T4TOTAL, FREET4, T3FREE, THYROIDAB in the last 72 hours. Anemia Panel: No results for input(s): VITAMINB12, FOLATE, FERRITIN, TIBC, IRON, RETICCTPCT in the last 72 hours. Sepsis Labs: Recent Labs  Lab 10/08/19 2139  LATICACIDVEN 1.4    Recent Results (from the past 240 hour(s))  SARS Coronavirus 2 by RT PCR (hospital order, performed in [facility] hospital lab) Nasopharyngeal Nasopharyngeal Swab     Status: None   Collection Time: 10/05/19 12:27 AM   Specimen: Nasopharyngeal Swab  Result Value Ref Range Status   SARS Coronavirus 2 NEGATIVE NEGATIVE Final    Comment: (NOTE) SARS-CoV-2 target nucleic acids are NOT DETECTED.  The SARS-CoV-2 RNA is generally detectable in upper and lower respiratory specimens during the acute phase of infection. The lowest concentration of SARS-CoV-2 viral copies this assay can detect is 250 copies / mL. A negative result does not preclude SARS-CoV-2 infection and should not be used as the sole basis for treatment or other patient management decisions.  A negative result may occur with improper specimen collection / handling, submission of specimen other than nasopharyngeal swab, presence of viral mutation(s) within the areas targeted by this assay, and inadequate number of viral copies (<250 copies / mL). A negative result must be combined with clinical observations, patient history, and epidemiological information.  Fact Sheet for Patients:   StrictlyIdeas.no  Fact Sheet for Healthcare Providers: BankingDealers.co.za  This test is not yet approved or  cleared by the [place] FDA and has been authorized for detection and/or  diagnosis of SARS-CoV-2 by FDA under an Emergency Use Authorization (EUA).  This EUA will remain in effect (meaning this test can be used) for the duration of the COVID-19 declaration under Section 564(b)(1) of the Act, 21 U.S.C. section 360bbb-3(b)(1), unless the authorization is terminated or revoked sooner.  Performed at [facility], [place] [address] [place], [place] [address]    Radiology Studies: US RENAL  Result Date: 10/07/2019 CLINICAL DATA:  Acute renal injury EXAM: RENAL / URINARY TRACT ULTRASOUND COMPLETE COMPARISON:  12/16/2011 FINDINGS: Right Kidney: Renal measurements: 8.9 x 3.2 x 3.2 cm. = volume: 47 mL. 1.9 cm cyst is noted in the lower pole new from  the prior exam. Diffuse cortical thinning is seen. No hydronephrosis is noted. Left Kidney: Renal measurements: 9.4 x 4.9 x 3.3 cm. = volume: 78.5 mL. Echogenicity within normal limits. No mass or hydronephrosis visualized. Bladder: Decompressed Other: None. IMPRESSION: Right renal cyst. Right-sided cortical thinning.  No new acute abnormality is noted. Electronically Signed   By: Inez Catalina M.D.   On: 10/07/2019 20:45     Scheduled Meds: . amLODipine  2.5 mg Oral Daily  . apixaban  2.5 mg Oral BID  . atorvastatin  40 mg Oral Daily  . carvedilol  6.25 mg Oral BID WC  . Chlorhexidine Gluconate Cloth  6 each Topical Daily  . clopidogrel  75 mg Oral Daily  . finasteride  5 mg Oral QPM  . furosemide  40 mg Oral Daily  . insulin aspart  0-5 Units Subcutaneous QHS  . insulin aspart  0-9 Units Subcutaneous TID WC  . latanoprost  1 drop Both Eyes QHS  . levothyroxine  75 mcg Oral Daily  . tamsulosin  0.4 mg Oral BID   Continuous Infusions: . cefTRIAXone (ROCEPHIN)  IV       LOS: 4 days   Time spent: 36min  Jakari C Kimberleigh Mehan, DO [facility]  If 7PM-7AM, please contact night-coverage [email or web address]  10/09/2019, 8:00 AM

## 2019-10-09 NOTE — Consult Note (Signed)
Consultation Note Date: 10/09/2019   Patient Name: [name]  DOB: [birth year]  MRN: [id number]  Age / Sex: 86 y.o., male  PCP: Redmond School, MD Referring Physician: Little Ishikawa, MD  Reason for Consultation: Establishing goals of care  HPI/Patient Profile: 86 y.o. male  with past medical history of a fib, aortic stenosis, diastolic heart failure, HTN, T2DM, PVD, bilateral AKA in Aug 2020 with bilateral prosthesis, and hypothyroidism admitted on 10/04/2019 with chest pain.  Diagnosed with NSTEMI. Also found to have AKI on CKD. Patient not a good candidate for cath or TAVR. Plan is for medication management only but some limitation in medical therapy d/t aortic stenosis and CKD. Some worsening mental status - now being treated for UTI. PMT consulted for [place].   Please note that PMT met with patient and son during previous hospitalization Aug 2020 - at that time son was reluctant to speak with this and did not want to discuss code status.   Clinical Assessment and Goals of Care: I have reviewed medical records including EPIC notes, labs and imaging, assessed the patient and then met with patient's daughter, [name], to discuss diagnosis prognosis, GOC, EOL wishes, disposition and options.  Patient was drowsy/sleeping and unable to participate in [facility] discussion.  I introduced Palliative Medicine as specialized medical care for people living with serious illness. It focuses on providing relief from the symptoms and stress of a serious illness. The goal is to improve quality of life for both the patient and the family.  [name] shares that patient has been doing "okay" for the past several months - had started walking "some" with his walker and prosthetics. He was dependent on others for toileting and bathing. He had a good appetite and was mentally sharp.    We discussed patient's current illness and what it means in the larger context of  patient's on-going co-morbidities. [name] understands heart conditions and limitations of treatment options. We discuss hope for improvement in mental status with treating UTI.   Family's goal is to keep patient at home - they have a lot of support and are setup for someone to be with patient all of the time.   We did discuss code status. Encouraged [name] to consider DNR/DNI status understanding evidenced based poor outcomes in similar hospitalized patients, as the cause of the arrest is likely associated with chronic/terminal disease rather than a reversible acute cardio-pulmonary event. [name] agrees and tells me this is what patient would want. However, she would like to first discuss this with her brothers. I offered to include them in conversation but she would like to speak to them independently later.   Discussed with family the importance of continued conversation with family and the medical providers regarding overall plan of care and treatment options, ensuring decisions are within the context of the patient's values and GOCs.    Palliative Care services outpatient were explained and offered. [name] shares they have a lot of medical support at home and are not interested in extra support at this time.   Questions and concerns were addressed. The family was encouraged to call with questions or concerns.   Primary Decision Maker PATIENT typically able to make decisions - mental status currently altered likely r/t infection - daughter [name] is HCPOA, with son [name] as secondary   SUMMARY OF RECOMMENDATIONS   - family has good understanding of situation and limited treatment options - DNR recommended - daughter agrees but requests time to speak with brothers about  decision - plan for PMT to follow up tomorrow - patient has living will and HCPOA documents in Epic under ACP documents  Code Status/Advance Care Planning:  Full code  Prognosis:   Unable to determine  Discharge Planning: Home  with Home Health      Primary Diagnoses: Present on Admission: . Atrial fibrillation, chronic . Essential hypertension, benign . Type 2 diabetes mellitus with vascular disease ([place]) . Chronic diastolic CHF (congestive heart failure) ([place])   I have reviewed the medical record, interviewed the patient and family, and examined the patient. The following aspects are pertinent.  Past Medical History:  Diagnosis Date  . Arthritis   . Borderline diabetic   . CHF (congestive heart failure) ([place])   . Chronic kidney disease   . Diabetes mellitus without complication ([place])   . Dysrhythmia   . Glaucoma   . Hyperlipidemia   . Hypertension   . Hypothyroidism   . PVD (peripheral vascular disease) ([place])   . Sleep apnea    Cpap ordered but doesnt use  . Urinary retention due to benign prostatic hyperplasia 12/28/2018   Social History   Socioeconomic History  . Marital status: Widowed    Spouse name: Not on file  . Number of children: 3  . Years of education: 45 th  . Highest education level: Not on file  Occupational History  . Occupation: Retired Psychologist, sport and exercise  Tobacco Use  . Smoking status: Former Smoker    Quit date: 01/13/1965    Years since quitting: 54.7  . Smokeless tobacco: Former Systems developer    Types: Secondary school teacher  . Vaping Use: Never used  Substance and Sexual Activity  . Alcohol use: No  . Drug use: No  . Sexual activity: Not Currently  Other Topics Concern  . Not on file  Social History Narrative    Has 2 children! Very supportive.    Social Determinants of Health   Financial Resource Strain: Low Risk   . Difficulty of Paying Living Expenses: Not hard at all  Food Insecurity: No Food Insecurity  . Worried About Charity fundraiser in the Last Year: Never true  . Ran Out of Food in the Last Year: Never true  Transportation Needs: No Transportation Needs  . Lack of Transportation (Medical): No  . Lack of Transportation (Non-Medical): No  Physical Activity: Inactive   . Days of Exercise per Week: 0 days  . Minutes of Exercise per Session: 0 min  Stress: Stress Concern Present  . Feeling of Stress : To some extent  Social Connections: Socially Isolated  . Frequency of Communication with Friends and Family: More than three times a week  . Frequency of Social Gatherings with Friends and Family: More than three times a week  . Attends Religious Services: Never  . Active Member of Clubs or Organizations: No  . Attends Archivist Meetings: Never  . Marital Status: Widowed   Family History  Problem Relation Age of Onset  . Cancer Mother   . Heart attack Father   . Stroke Father   . Heart attack Brother   . Heart attack Brother    Scheduled Meds: . amLODipine  2.5 mg Oral Daily  . apixaban  2.5 mg Oral BID  . atorvastatin  40 mg Oral Daily  . carvedilol  6.25 mg Oral BID WC  . Chlorhexidine Gluconate Cloth  6 each Topical Daily  . clopidogrel  75 mg Oral Daily  . finasteride  5  mg Oral QPM  . insulin aspart  0-5 Units Subcutaneous QHS  . insulin aspart  0-9 Units Subcutaneous TID WC  . latanoprost  1 drop Both Eyes QHS  . levothyroxine  75 mcg Oral Daily  . tamsulosin  0.4 mg Oral BID   Continuous Infusions: . cefTRIAXone (ROCEPHIN)  IV 1 g (10/09/19 0906)   PRN Meds:.acetaminophen Allergies  Allergen Reactions  . Iodinated Diagnostic Agents Other (See Comments)    caused Fever  . Dye Fdc Red [Red Dye] Hives    Tolerated red Docusate, Niferex   Review of Systems  Unable to perform ROS: Mental status change    Physical Exam Constitutional:      Comments: lethargic  Cardiovascular:     Rate and Rhythm: Normal rate and regular rhythm.  Pulmonary:     Effort: Pulmonary effort is normal.     Comments: On nasal cannula, desaturating initially but recovered with readjustment of cannula Skin:    General: Skin is warm and dry.  Neurological:     Mental Status: He is disoriented.     Vital Signs: BP (!) 153/60 (BP  Location: Right Arm)   Pulse (!) 55   Temp (!) 97.4 F (36.3 C) (Oral)   Resp 19   Ht 4' 8" (1.422 m)   Wt 98.7 kg   SpO2 (!) 88%   BMI 48.78 kg/m  Pain Scale: 0-10   Pain Score: 0-No pain   SpO2: SpO2: (!) 88 % O2 Device:SpO2: (!) 88 % O2 Flow Rate: .O2 Flow Rate (L/min): 4 L/min  IO: Intake/output summary:   Intake/Output Summary (Last 24 hours) at 10/09/2019 1334 Last data filed at 10/09/2019 0327 Gross per 24 hour  Intake --  Output 1500 ml  Net -1500 ml    LBM: Last BM Date: 10/06/19 Baseline Weight: Weight: 100.4 kg Most recent weight: Weight: 98.7 kg     Palliative Assessment/Data: PPS 40%    Time Total: 65 minutes Greater than 50%  of this time was spent counseling and coordinating care related to the above assessment and plan.  Juel Burrow, DNP, AGNP-C Palliative Medicine Team [phone] Pager: [phone]

## 2019-10-09 NOTE — Progress Notes (Signed)
Progress Note  Patient Name: [name] Date of Encounter: 10/09/2019  Primary Cardiologist: Travis Casino, MD   Subjective   Difficulty with hearing. Nurse thinks a little more confused.   Inpatient Medications    Scheduled Meds: . amLODipine  2.5 mg Oral Daily  . apixaban  2.5 mg Oral BID  . atorvastatin  40 mg Oral Daily  . carvedilol  6.25 mg Oral BID WC  . Chlorhexidine Gluconate Cloth  6 each Topical Daily  . clopidogrel  75 mg Oral Daily  . finasteride  5 mg Oral QPM  . furosemide  40 mg Oral Daily  . insulin aspart  0-5 Units Subcutaneous QHS  . insulin aspart  0-9 Units Subcutaneous TID WC  . latanoprost  1 drop Both Eyes QHS  . levothyroxine  75 mcg Oral Daily  . tamsulosin  0.4 mg Oral BID   Continuous Infusions: . cefTRIAXone (ROCEPHIN)  IV 1 g (10/09/19 0906)   PRN Meds: acetaminophen   Vital Signs    Vitals:   10/09/19 0000 10/09/19 0325 10/09/19 0500 10/09/19 0730  BP: (!) 144/68 (!) 153/66  (!) 159/66  Pulse: 60 (!) 59  (!) 58  Resp: (!) 21 20  20   Temp: 99 F (37.2 C) 99.3 F (37.4 C)  98.2 F (36.8 C)  TempSrc: Axillary Axillary  Oral  SpO2: 93% 92%  90%  Weight:   98.7 kg   Height:        Intake/Output Summary (Last 24 hours) at 10/09/2019 0958 Last data filed at 10/09/2019 0327 Gross per 24 hour  Intake --  Output 2500 ml  Net -2500 ml   Filed Weights   10/06/19 0420 10/07/19 0616 10/09/19 0500  Weight: 98.7 kg 99.1 kg 98.7 kg    Telemetry    Sinus brady - Personally Reviewed  ECG    none - Personally Reviewed  Physical Exam   GEN: No acute distress, difficulty hearing.   Neck: No JVD Cardiac: Reg brady, no murmurs, rubs, or gallops.  Respiratory: scattered rales but no increased risk of breathing. GI: Soft, nontender, non-distended  MS: No edema; No deformity. Neuro:  Nonfocal  Psych: blunted affect   Labs    Chemistry Recent Labs  Lab 10/04/19 1800 10/05/19 0333 10/07/19 0416 10/08/19 0747  10/09/19 0732  NA 133*   < > 133* 129* 132*  K 3.0*   < > 4.4 3.8 3.7  CL 94*   < > 96* 93* 94*  CO2 26   < > 24 22 26   GLUCOSE 229*   < > 208* 199* 198*  BUN 39*   < > 46* 45* 50*  CREATININE 2.72*   < > 2.73* 2.76* 3.00*  CALCIUM 9.0   < > 8.9 8.8* 8.7*  PROT 7.0  --   --   --   --   ALBUMIN 3.2*  --   --   --   --   AST 26  --   --   --   --   ALT 17  --   --   --   --   ALKPHOS 61  --   --   --   --   BILITOT 0.6  --   --   --   --   GFRNONAA 20*   < > 20* 19* 17*  GFRAA 23*   < > 23* 22* 20*  ANIONGAP 13   < > 13 14 12    < > =  values in this interval not displayed.     Hematology Recent Labs  Lab 10/07/19 0416 10/08/19 0747 10/09/19 0732  WBC 10.3 14.0* 12.2*  RBC 3.50* 3.55* 3.29*  HGB 11.2* 11.2* 10.5*  HCT 33.5* 33.6* 31.0*  MCV 95.7 94.6 94.2  MCH 32.0 31.5 31.9  MCHC 33.4 33.3 33.9  RDW 14.6 14.1 14.1  PLT 186 177 165    Cardiac EnzymesNo results for input(s): TROPONINI in the last 168 hours. No results for input(s): TROPIPOC in the last 168 hours.   BNPNo results for input(s): BNP, PROBNP in the last 168 hours.   DDimer No results for input(s): DDIMER in the last 168 hours.   Radiology    US RENAL  Result Date: 10/07/2019 CLINICAL DATA:  Acute renal injury EXAM: RENAL / URINARY TRACT ULTRASOUND COMPLETE COMPARISON:  12/16/2011 FINDINGS: Right Kidney: Renal measurements: 8.9 x 3.2 x 3.2 cm. = volume: 47 mL. 1.9 cm cyst is noted in the lower pole new from the prior exam. Diffuse cortical thinning is seen. No hydronephrosis is noted. Left Kidney: Renal measurements: 9.4 x 4.9 x 3.3 cm. = volume: 78.5 mL. Echogenicity within normal limits. No mass or hydronephrosis visualized. Bladder: Decompressed Other: None. IMPRESSION: Right renal cyst. Right-sided cortical thinning.  No new acute abnormality is noted. Electronically Signed   By: [name] M.D.   On: 10/07/2019 20:45    Cardiac Studies   none  Patient Profile     86 y.o. male admitted with  NSTEMI, worsening renal insufficiency, and known AS in the setting of chronic diastolic heart failure  Assessment & Plan    1. NSTEMI - conservative management. He denies chest pain. 2. Stage 4 renal insuff - his creatinine is not improving and is up to 3 today. Consider holding the lasix. 3. AS - not really a TAVR candidate 4. Worsening mentation - he is being evaluated for a UTI which is very possible as is pneumonia.     For questions or updates, please contact [place] Please consult [email or web address] for contact info under Cardiology/STEMI.      Signed, Travis Peru, MD  10/09/2019, 9:58 AM  Patient ID: [name], male   DOB: Feb 10, 1927, 86 y.o.   MRN: [id number]

## 2019-10-09 NOTE — Progress Notes (Signed)
OOB with hoyer/maxi move. Up in chair for about an hour at this time. Responds to voice. Answers simple 'yes' or 'no' questions appropriately. Otherwise has been sleeping and remains lethargic. Urine culture sent. Dr Avon Gully updated.

## 2019-10-10 DIAGNOSIS — N179 Acute kidney failure, unspecified: Secondary | ICD-10-CM

## 2019-10-10 DIAGNOSIS — Z515 Encounter for palliative care: Secondary | ICD-10-CM

## 2019-10-10 DIAGNOSIS — I350 Nonrheumatic aortic (valve) stenosis: Secondary | ICD-10-CM

## 2019-10-10 LAB — BASIC METABOLIC PANEL
Anion gap: 13 (ref 5–15)
BUN: 56 mg/dL — ABNORMAL HIGH (ref 8–23)
CO2: 21 mmol/L — ABNORMAL LOW (ref 22–32)
Calcium: 8.4 mg/dL — ABNORMAL LOW (ref 8.9–10.3)
Chloride: 96 mmol/L — ABNORMAL LOW (ref 98–111)
Creatinine, Ser: 2.86 mg/dL — ABNORMAL HIGH (ref 0.61–1.24)
GFR calc Af Amer: 21 mL/min — ABNORMAL LOW (ref >60)
GFR calc non Af Amer: 19 mL/min — ABNORMAL LOW (ref >60)
Glucose, Bld: 191 mg/dL — ABNORMAL HIGH (ref 70–99)
Potassium: 3.7 mmol/L (ref 3.5–5.1)
Sodium: 130 mmol/L — ABNORMAL LOW (ref 135–145)

## 2019-10-10 LAB — CBC
HCT: 32.1 % — ABNORMAL LOW (ref 39.0–52.0)
Hemoglobin: 10.9 g/dL — ABNORMAL LOW (ref 13.0–17.0)
MCH: 32.5 pg (ref 26.0–34.0)
MCHC: 34 g/dL (ref 30.0–36.0)
MCV: 95.8 fL (ref 80.0–100.0)
Platelets: 182 10*3/uL (ref 150–400)
RBC: 3.35 MIL/uL — ABNORMAL LOW (ref 4.22–5.81)
RDW: 14.3 % (ref 11.5–15.5)
WBC: 11.1 10*3/uL — ABNORMAL HIGH (ref 4.0–10.5)
nRBC: 0 % (ref 0.0–0.2)

## 2019-10-10 LAB — GLUCOSE, CAPILLARY
Glucose-Capillary: 187 mg/dL — ABNORMAL HIGH (ref 70–99)
Glucose-Capillary: 246 mg/dL — ABNORMAL HIGH (ref 70–99)
Glucose-Capillary: 196 mg/dL — ABNORMAL HIGH (ref 70–99)
Glucose-Capillary: 235 mg/dL — ABNORMAL HIGH (ref 70–99)

## 2019-10-10 LAB — URINE CULTURE

## 2019-10-10 MED ORDER — BISACODYL 10 MG RE SUPP
10.0000 mg | Freq: Every day | RECTAL | Status: DC
  Administered 2019-10-10: 10 mg via RECTAL
  Filled 2019-10-10: qty 1

## 2019-10-10 NOTE — Progress Notes (Signed)
Patient ID: [name], male   DOB: [birth year], 86 y.o.   MRN: [id number] S: No events overnight O:BP (!) 143/67 (BP Location: Right Arm)   Pulse (!) 56   Temp 98.6 F (37 C) (Oral)   Resp 19   Ht 4\' 8"  (1.422 m)   Wt 99.5 kg   SpO2 90%   BMI 49.18 kg/m   Intake/Output Summary (Last 24 hours) at 10/10/2019 1147 Last data filed at 10/10/2019 0436 Gross per 24 hour  Intake 100 ml  Output 850 ml  Net -750 ml   Intake/Output: I/O last 3 completed shifts: In: 100 [IV Piggyback:100] Out: 1700 [Urine:1700]  Intake/Output this shift:  No intake/output data recorded. Weight change: 0.8 kg Gen: NAD CVS: bradycardic, no rub Resp: cta Abd: benign Ext: s/p bilateral BKA's.  Recent Labs  Lab 10/04/19 1800 10/05/19 0333 10/06/19 1430 10/07/19 0416 10/08/19 0747 10/09/19 0732 10/10/19 0401  NA 133* 135 131* 133* 129* 132* 130*  K 3.0* 3.5 4.0 4.4 3.8 3.7 3.7  CL 94* 96* 98 96* 93* 94* 96*  CO2 26 26 25 24 22 26  21*  GLUCOSE 229* 203* 243* 208* 199* 198* 191*  BUN 39* 38* 42* 46* 45* 50* 56*  CREATININE 2.72* 2.55* 2.67* 2.73* 2.76* 3.00* 2.86*  ALBUMIN 3.2*  --   --   --   --   --   --   CALCIUM 9.0 9.2 8.9 8.9 8.8* 8.7* 8.4*  AST 26  --   --   --   --   --   --   ALT 17  --   --   --   --   --   --    Liver Function Tests: Recent Labs  Lab 10/04/19 1800  AST 26  ALT 17  ALKPHOS 61  BILITOT 0.6  PROT 7.0  ALBUMIN 3.2*   No results for input(s): LIPASE, AMYLASE in the last 168 hours. No results for input(s): AMMONIA in the last 168 hours. CBC: Recent Labs  Lab 10/06/19 1430 10/06/19 1430 10/07/19 0416 10/07/19 0416 10/08/19 0747 10/09/19 0732 10/10/19 0401  WBC 8.6   < > 10.3   < > 14.0* 12.2* 11.1*  HGB 11.6*   < > 11.2*   < > 11.2* 10.5* 10.9*  HCT 34.1*   < > 33.5*   < > 33.6* 31.0* 32.1*  MCV 94.7  --  95.7  --  94.6 94.2 95.8  PLT 183   < > 186   < > 177 165 182   < > = values in this interval not displayed.   Cardiac Enzymes: No results for  input(s): CKTOTAL, CKMB, CKMBINDEX, TROPONINI in the last 168 hours. CBG: Recent Labs  Lab 10/09/19 1103 10/09/19 1618 10/09/19 2159 10/10/19 0739 10/10/19 1140  GLUCAP 217* 215* 252* 187* 246*    Iron Studies: No results for input(s): IRON, TIBC, TRANSFERRIN, FERRITIN in the last 72 hours. Studies/Results: DG Chest Port 1 View  Result Date: 10/09/2019 CLINICAL DATA:  Hypoxia. EXAM: PORTABLE CHEST 1 VIEW COMPARISON:  October 04, 2019 FINDINGS: Minimally enlarged cardiac silhouette. Calcific atherosclerotic disease of the aorta. Mixed pattern pulmonary edema with likely bilateral pleural effusions. Left lower lobe more confluent airspace consolidation cannot be excluded. Osseous structures are without acute abnormality. Soft tissues are grossly normal. IMPRESSION: 1. Mixed pattern pulmonary edema with likely bilateral pleural effusions. 2. Left lower lobe more confluent airspace consolidation cannot be excluded. 3. Calcific atherosclerotic disease of the aorta.  Electronically Signed   By: Fidela Salisbury M.D.   On: 10/09/2019 13:11   . amLODipine  2.5 mg Oral Daily  . apixaban  2.5 mg Oral BID  . atorvastatin  40 mg Oral Daily  . bisacodyl  10 mg Rectal Daily  . carvedilol  6.25 mg Oral BID WC  . Chlorhexidine Gluconate Cloth  6 each Topical Daily  . clopidogrel  75 mg Oral Daily  . finasteride  5 mg Oral QPM  . insulin aspart  0-5 Units Subcutaneous QHS  . insulin aspart  0-9 Units Subcutaneous TID WC  . latanoprost  1 drop Both Eyes QHS  . levothyroxine  75 mcg Oral Daily  . tamsulosin  0.4 mg Oral BID    BMET    Component Value Date/Time   NA 130 (L) 10/10/2019 0401   NA 127 (L) 07/23/2018 1249   K 3.7 10/10/2019 0401   CL 96 (L) 10/10/2019 0401   CO2 21 (L) 10/10/2019 0401   GLUCOSE 191 (H) 10/10/2019 0401   BUN 56 (H) 10/10/2019 0401   BUN 26 07/23/2018 1249   CREATININE 2.86 (H) 10/10/2019 0401   CALCIUM 8.4 (L) 10/10/2019 0401   GFRNONAA 19 (L) 10/10/2019  0401   GFRAA 21 (L) 10/10/2019 0401   CBC    Component Value Date/Time   WBC 11.1 (H) 10/10/2019 0401   RBC 3.35 (L) 10/10/2019 0401   HGB 10.9 (L) 10/10/2019 0401   HGB 10.4 (L) 07/23/2018 1249   HCT 32.1 (L) 10/10/2019 0401   HCT 30.4 (L) 07/23/2018 1249   PLT 182 10/10/2019 0401   PLT 255 07/23/2018 1249   MCV 95.8 10/10/2019 0401   MCV 95 07/23/2018 1249   MCH 32.5 10/10/2019 0401   MCHC 34.0 10/10/2019 0401   RDW 14.3 10/10/2019 0401   RDW 12.3 07/23/2018 1249   LYMPHSABS 3.0 02/04/2019 0620   MONOABS 0.6 02/04/2019 0620   EOSABS 0.5 02/04/2019 0620   BASOSABS 0.1 02/04/2019 0620     Assessment/Plan:  1. Non-oliguric,AKI/CKD stage 3a- presumably due to ischemic ATN in setting of NSTEMI, mod-severe AS, A fib, diuretics for CHF, and chronic indwelling foley catheter. BUN/Cr relatively stable with marked increase in UOP overnight. He is not a candidate for dialysis given his advanced age, multiple co-morbidities (especially his CAD and mod-severe AS), and poor nutritional and functional status. Will continue with conservative management. Family and other medical teams are in agreement.   1. Has had significant diuresis.  Would recommend holding diuretics for 24 hours and follow if ok with Cardiology 2. Scr improving.  Nothing further to add. Will sign off, please call with questions or concerns.  2. NSTEMI- medical management 3. Moderate to severe Aortic stenosis- medical management 4. Chronic diastolic chf- stable 5. BPH continue with foley 6. HTN- stable 7. Disposition- recommend palliative care consult to help set goals/limits of care as he is still full code.  Donetta Potts, MD [employer] [phone]

## 2019-10-10 NOTE — Progress Notes (Signed)
Progress Note  Patient Name: [name] Date of Encounter: 10/10/2019  Primary Cardiologist: Pixie Casino, MD   Subjective   Denies chest pain or sob. Hard of hearing.  Inpatient Medications    Scheduled Meds: . amLODipine  2.5 mg Oral Daily  . apixaban  2.5 mg Oral BID  . atorvastatin  40 mg Oral Daily  . bisacodyl  10 mg Rectal Daily  . carvedilol  6.25 mg Oral BID WC  . Chlorhexidine Gluconate Cloth  6 each Topical Daily  . clopidogrel  75 mg Oral Daily  . finasteride  5 mg Oral QPM  . insulin aspart  0-5 Units Subcutaneous QHS  . insulin aspart  0-9 Units Subcutaneous TID WC  . latanoprost  1 drop Both Eyes QHS  . levothyroxine  75 mcg Oral Daily  . tamsulosin  0.4 mg Oral BID   Continuous Infusions: . cefTRIAXone (ROCEPHIN)  IV 1 g (10/10/19 0922)   PRN Meds: acetaminophen   Vital Signs    Vitals:   10/10/19 0829 10/10/19 0830 10/10/19 0834 10/10/19 0928  BP:      Pulse: (!) 52 (!) 51 (!) 51 (!) 59  Resp: 18 18 18  (!) 23  Temp:      TempSrc:      SpO2: (!) 89% 93% 93% 94%  Weight:      Height:        Intake/Output Summary (Last 24 hours) at 10/10/2019 1048 Last data filed at 10/10/2019 0436 Gross per 24 hour  Intake 100 ml  Output 850 ml  Net -750 ml   Filed Weights   10/07/19 0616 10/09/19 0500 10/10/19 0434  Weight: 99.1 kg 98.7 kg 99.5 kg    Telemetry    nsr - Personally Reviewed  ECG    none - Personally Reviewed  Physical Exam   GEN: No acute distress.   Neck: No JVD Cardiac: RRR, AS murmur, rubs, or gallops.  Respiratory: Clear to auscultation bilaterally. GI: Soft, nontender, non-distended  MS: No edema; No deformity. Neuro:  Nonfocal  Psych: Normal affect   Labs    Chemistry Recent Labs  Lab 10/04/19 1800 10/05/19 0333 10/08/19 0747 10/09/19 0732 10/10/19 0401  NA 133*   < > 129* 132* 130*  K 3.0*   < > 3.8 3.7 3.7  CL 94*   < > 93* 94* 96*  CO2 26   < > 22 26 21*  GLUCOSE 229*   < > 199* 198* 191*  BUN  39*   < > 45* 50* 56*  CREATININE 2.72*   < > 2.76* 3.00* 2.86*  CALCIUM 9.0   < > 8.8* 8.7* 8.4*  PROT 7.0  --   --   --   --   ALBUMIN 3.2*  --   --   --   --   AST 26  --   --   --   --   ALT 17  --   --   --   --   ALKPHOS 61  --   --   --   --   BILITOT 0.6  --   --   --   --   GFRNONAA 20*   < > 19* 17* 19*  GFRAA 23*   < > 22* 20* 21*  ANIONGAP 13   < > 14 12 13    < > = values in this interval not displayed.     Hematology Recent Labs  Lab  10/08/19 0747 10/09/19 0732 10/10/19 0401  WBC 14.0* 12.2* 11.1*  RBC 3.55* 3.29* 3.35*  HGB 11.2* 10.5* 10.9*  HCT 33.6* 31.0* 32.1*  MCV 94.6 94.2 95.8  MCH 31.5 31.9 32.5  MCHC 33.3 33.9 34.0  RDW 14.1 14.1 14.3  PLT 177 165 182    Cardiac EnzymesNo results for input(s): TROPONINI in the last 168 hours. No results for input(s): TROPIPOC in the last 168 hours.   BNPNo results for input(s): BNP, PROBNP in the last 168 hours.   DDimer No results for input(s): DDIMER in the last 168 hours.   Radiology    DG Chest Port 1 View  Result Date: 10/09/2019 CLINICAL DATA:  Hypoxia. EXAM: PORTABLE CHEST 1 VIEW COMPARISON:  October 04, 2019 FINDINGS: Minimally enlarged cardiac silhouette. Calcific atherosclerotic disease of the aorta. Mixed pattern pulmonary edema with likely bilateral pleural effusions. Left lower lobe more confluent airspace consolidation cannot be excluded. Osseous structures are without acute abnormality. Soft tissues are grossly normal. IMPRESSION: 1. Mixed pattern pulmonary edema with likely bilateral pleural effusions. 2. Left lower lobe more confluent airspace consolidation cannot be excluded. 3. Calcific atherosclerotic disease of the aorta. Electronically Signed   By: Fidela Salisbury M.D.   On: 10/09/2019 13:11    Cardiac Studies   none  Patient Profile     86 y.o. male admitted with sob,NSTEMI, and worsening renal insufficiency.   Assessment & Plan    1. NSTEMI - he denies angijnal symptoms.  2.  Stage 4 renal insufficiency - Dr. Lurline Del note reviewed. Agree with holding his diuretic. 3. AS - noted not to be a candidate for a TAVR     For questions or updates, please contact [place] Please consult [email or web address] for contact info under Cardiology/STEMI.      Signed, Cristopher Peru, MD  10/10/2019, 10:48 AM  Patient ID: [name], male   DOB: 05-16-1927, 86 y.o.   MRN: [id number]

## 2019-10-10 NOTE — Progress Notes (Signed)
PROGRESS NOTE    [name]  OZH:[id number] DOB: Jun 16, 1927 DOA: 10/04/2019 PCP: Redmond School, MD   Brief Narrative:  [name] is a 86 y.o. male with medical history significant for atrial fibrillation, aortic stenosis, chronic diastolic heart failure, hypertension, diabetes mellitus type 2, peripheral vascular disease, s/p bilateral AKA and has bilateral prosthesis in place, hypothyroidism who presents EMS to the emergency room with complaint of chest pain.  Patient son is at bedside and helps provide history.  Patient returned home from rehab/PT appointment this afternoon around 2:00 pm and developed a substernal chest pressure and tightness in the upper chest.  States he was not doing any particular activity when the chest pressure began.  Reports he did not have chest pressure when he was at his physical therapy appointment earlier in the day.  After sitting and resting it continued to occur.  His son gave him a Pepcid for presumed indigestion but the pain did not improve.  He then developed left shoulder and left upper arm pain and so was brought to the emergency room for further evaluation.  He did not have any shortness of breath, nausea, vomiting or diaphoresis with the episode.  Reports that the pressure was an 8-9/10 at its worst and is currently solved.  He was given aspirin by EMS in route to the hospital.  Denies any complaints at this time. He has 1 son who lives with him during the week and his other children stay with him over the weekend he always has somebody at home with him. Reports he has been compliant with his medications. In ED: Mr. Donatelli has an elevated troponin level in the emergency room.  Repeat troponin shows an increase from the initial level around 100 to over 500.  His EKG shows no ST elevation.  He is found to have AKI superimposed on his chronic kidney disease.  ER provider discussed with cardiology who is going to see patient in the emergency room and provide  recommendations. Hospitalist service is asked to evaluate and help manage patient  Assessment & Plan:   Principal Problem:   NSTEMI (non-ST elevated myocardial infarction) ([place]) Active Problems:   Essential hypertension, benign   Moderate aortic stenosis   Chronic diastolic CHF (congestive heart failure) (HCC)   Atrial fibrillation, chronic   Type 2 diabetes mellitus with vascular disease (HCC)   S/P BKA (below knee amputation) unilateral, right (HCC)   Acute kidney injury superimposed on CKD ([place])   Hypokalemia   NSTEMI (non-ST elevated myocardial infarction) ([place]), unspecified, POA - Cardiology continues to follow -appreciate insight and recommendations - Heparin drip off, resume Eliquis and Plavix, discontinue aspirin - Lengthy discussion daily at bedside with patient and his son that medical management would likely be in the best interest given patient's minimal symptoms, essentially nonambulatory status very minimal exertion and risk given age and comorbidities as below -Somewhat limited in medical therapy options given patient's aortic stenosis, CKD which limits nitrates and Ranexa.  Continue to titrate beta-blocker along with cardiology -Continue statin therapy and close monitoring  Acute kidney injury superimposed on CKD 2 ([place]), minimally improving -Creatinine somewhat labile but stable over the past 72h, likely his new baseline -Encouraged increased p.o. intake of free water; IVF off, diuretics off  Acute hypoxic respiratory failure in the setting of volume overload - Likely volume overload given new worsening kidney function (likely new baseline) and previously necessary IV fluids in the setting of AKI - Hold diuretics given creatinine -  continues to have adequate UOP - Continue supplemental oxygen as necessary to keep O2 >88% SpO2: 90 % O2 Flow Rate (L/min): 2 L/min  UTI with transient metabolic encephalopathy, improving, POA - PCP indicates UA/urine culture  obtained prior to admission shows infection per family at bedside who were just updated late yesterday - Continue ceftriaxone - Mental status already improving from yesterday  Essential hypertension - Continue patient's home dose of Norvasc.  We will add low-dose Coreg 3.125 mg twice   Hypokalemia  WNL  S/P BKA (below knee amputation) bilateral.  DVT prophylaxis: Eliquis Code Status: Full Family Communication: Daughter at bedside  Status is: Patient  Dispo: The patient is from: Home              Anticipated d/c is to: To be determined -likely discharge home              Anticipated d/c date is: 24 hours pending clinical course              Patient currently not medically stable for discharge due to ongoing need for further evaluation and medication management, acute hypoxia and mental status changes in the setting of UTI and volume overload.  Consultants:   Cardiology  Procedures:   None planned  Antimicrobials:  Ceftriaxone x3 days  Subjective: Continues to be hypoxic, improved over the past 24 hours but not yet back to baseline, denies nausea, vomiting, diarrhea, constipation, headache, fever, chills.  Objective: Vitals:   10/09/19 2134 10/10/19 0028 10/10/19 0434 10/10/19 0740  BP: (!) 171/77 140/63 140/64 (!) 153/63  Pulse: 60 (!) 55 (!) 51 (!) 56  Resp: (!) 25 20 19 18   Temp: 98.8 F (37.1 C) 98.8 F (37.1 C) 97.8 F (36.6 C) 98.1 F (36.7 C)  TempSrc: Oral Oral Oral Axillary  SpO2: 97% 96% 93% 96%  Weight:   99.5 kg   Height:        Intake/Output Summary (Last 24 hours) at 10/10/2019 0816 Last data filed at 10/10/2019 0436 Gross per 24 hour  Intake 100 ml  Output 850 ml  Net -750 ml   Filed Weights   10/07/19 0616 10/09/19 0500 10/10/19 0434  Weight: 99.1 kg 98.7 kg 99.5 kg    Examination:  General:  Pleasantly resting in bed, No acute distress.  More awake alert this morning than previous HEENT:  Normocephalic atraumatic.  Sclerae nonicteric,  noninjected.  Extraocular movements intact bilaterally. Neck:  Without mass or deformity.  Trachea is midline. Lungs:  Clear to auscultate bilaterally without rhonchi, wheeze, or rales. Heart:  Regular rate and rhythm.  Blowing holosystolic murmur without overt rubs or gallops Abdomen:  Soft, nontender, nondistended.  Without guarding or rebound. Extremities: Without cyanosis, clubbing, edema, or obvious deformity.  Bilateral lower extremity BKA Vascular:  Dorsalis pedis and posterior tibial pulses palpable bilaterally. Skin:  Warm and dry, no erythema, no rashes.   Data Reviewed: I have personally reviewed following labs and imaging studies  CBC: Recent Labs  Lab 10/06/19 1430 10/07/19 0416 10/08/19 0747 10/09/19 0732 10/10/19 0401  WBC 8.6 10.3 14.0* 12.2* 11.1*  HGB 11.6* 11.2* 11.2* 10.5* 10.9*  HCT 34.1* 33.5* 33.6* 31.0* 32.1*  MCV 94.7 95.7 94.6 94.2 95.8  PLT 183 186 177 165 270   Basic Metabolic Panel: Recent Labs  Lab 10/04/19 1910 10/05/19 0333 10/06/19 1430 10/07/19 0416 10/08/19 0747 10/09/19 0732 10/10/19 0401  NA  --    < > 131* 133* 129* 132* 130*  K  --    < >  4.0 4.4 3.8 3.7 3.7  CL  --    < > 98 96* 93* 94* 96*  CO2  --    < > 25 24 22 26  21*  GLUCOSE  --    < > 243* 208* 199* 198* 191*  BUN  --    < > 42* 46* 45* 50* 56*  CREATININE  --    < > 2.67* 2.73* 2.76* 3.00* 2.86*  CALCIUM  --    < > 8.9 8.9 8.8* 8.7* 8.4*  MG 2.5*  --   --   --   --   --   --    < > = values in this interval not displayed.   GFR: Estimated Creatinine Clearance: 15.6 mL/min (A) (by C-G formula based on SCr of 2.86 mg/dL (H)). Liver Function Tests: Recent Labs  Lab 10/04/19 1800  AST 26  ALT 17  ALKPHOS 61  BILITOT 0.6  PROT 7.0  ALBUMIN 3.2*   No results for input(s): LIPASE, AMYLASE in the last 168 hours. No results for input(s): AMMONIA in the last 168 hours. Coagulation Profile: No results for input(s): INR, PROTIME in the last 168 hours. Cardiac  Enzymes: No results for input(s): CKTOTAL, CKMB, CKMBINDEX, TROPONINI in the last 168 hours. BNP (last 3 results) No results for input(s): PROBNP in the last 8760 hours. HbA1C: No results for input(s): HGBA1C in the last 72 hours. CBG: Recent Labs  Lab 10/09/19 0728 10/09/19 1103 10/09/19 1618 10/09/19 2159 10/10/19 0739  GLUCAP 190* 217* 215* 252* 187*   Lipid Profile: No results for input(s): CHOL, HDL, LDLCALC, TRIG, CHOLHDL, LDLDIRECT in the last 72 hours. Thyroid Function Tests: No results for input(s): TSH, T4TOTAL, FREET4, T3FREE, THYROIDAB in the last 72 hours. Anemia Panel: No results for input(s): VITAMINB12, FOLATE, FERRITIN, TIBC, IRON, RETICCTPCT in the last 72 hours. Sepsis Labs: Recent Labs  Lab 10/08/19 2139  LATICACIDVEN 1.4    Recent Results (from the past 240 hour(s))  SARS Coronavirus 2 by RT PCR (hospital order, performed in [facility] hospital lab) Nasopharyngeal Nasopharyngeal Swab     Status: None   Collection Time: 10/05/19 12:27 AM   Specimen: Nasopharyngeal Swab  Result Value Ref Range Status   SARS Coronavirus 2 NEGATIVE NEGATIVE Final    Comment: (NOTE) SARS-CoV-2 target nucleic acids are NOT DETECTED.  The SARS-CoV-2 RNA is generally detectable in upper and lower respiratory specimens during the acute phase of infection. The lowest concentration of SARS-CoV-2 viral copies this assay can detect is 250 copies / mL. A negative result does not preclude SARS-CoV-2 infection and should not be used as the sole basis for treatment or other patient management decisions.  A negative result may occur with improper specimen collection / handling, submission of specimen other than nasopharyngeal swab, presence of viral mutation(s) within the areas targeted by this assay, and inadequate number of viral copies (<250 copies / mL). A negative result must be combined with clinical observations, patient history, and epidemiological information.  Fact  Sheet for Patients:   StrictlyIdeas.no  Fact Sheet for Healthcare Providers: BankingDealers.co.za  This test is not yet approved or  cleared by the [place] FDA and has been authorized for detection and/or diagnosis of SARS-CoV-2 by FDA under an Emergency Use Authorization (EUA).  This EUA will remain in effect (meaning this test can be used) for the duration of the COVID-19 declaration under Section 564(b)(1) of the Act, 21 U.S.C. section 360bbb-3(b)(1), unless the authorization is terminated or  revoked sooner.  Performed at [facility], [place] [address], [place],  [address]    Radiology Studies: DG Chest Port 1 View  Result Date: 10/09/2019 CLINICAL DATA:  Hypoxia. EXAM: PORTABLE CHEST 1 VIEW COMPARISON:  October 04, 2019 FINDINGS: Minimally enlarged cardiac silhouette. Calcific atherosclerotic disease of the aorta. Mixed pattern pulmonary edema with likely bilateral pleural effusions. Left lower lobe more confluent airspace consolidation cannot be excluded. Osseous structures are without acute abnormality. Soft tissues are grossly normal. IMPRESSION: 1. Mixed pattern pulmonary edema with likely bilateral pleural effusions. 2. Left lower lobe more confluent airspace consolidation cannot be excluded. 3. Calcific atherosclerotic disease of the aorta. Electronically Signed   By: Fidela Salisbury M.D.   On: 10/09/2019 13:11     Scheduled Meds: . amLODipine  2.5 mg Oral Daily  . apixaban  2.5 mg Oral BID  . atorvastatin  40 mg Oral Daily  . carvedilol  6.25 mg Oral BID WC  . Chlorhexidine Gluconate Cloth  6 each Topical Daily  . clopidogrel  75 mg Oral Daily  . finasteride  5 mg Oral QPM  . insulin aspart  0-5 Units Subcutaneous QHS  . insulin aspart  0-9 Units Subcutaneous TID WC  . latanoprost  1 drop Both Eyes QHS  . levothyroxine  75 mcg Oral Daily  . tamsulosin  0.4 mg Oral BID   Continuous Infusions: .  cefTRIAXone (ROCEPHIN)  IV 1 g (10/09/19 0906)     LOS: 5 days   Time spent: 44min  Travian C Elgin Carn, DO [facility]  If 7PM-7AM, please contact night-coverage [email or web address]  10/10/2019, 8:16 AM

## 2019-10-10 NOTE — Progress Notes (Signed)
Daily Progress Note   Patient Name: [name]       Date: 10/10/2019 DOB: [birth year]  Age: 86 y.o. MRN#: [id number] Attending Physician: Little Ishikawa, MD Primary Care Physician: Redmond School, MD Admit Date: 10/04/2019  Reason for Consultation/Follow-up:  To discuss complex medical decision making related to patient's goals of care  Subjective: Met with patient and daughter [name] at bedside.  Patient is alert and interactive.  He says he is "fair".  Complains that he has not had a bowel movement.  [name] states the last one was Thursday.   Talked with patient and daughter about support in the home.  Patient's children take turns staying with him.  He has 7 siblings who are all still alive.  Close family, lots of support.      Suggested PMT outpatient follow at home.  Daughter replies that he has so many nurses calling on him from the insurance company that they really do not need any further support.  Equipment needs in the home are cared for.  Talked with patient and daughter about heroic measures in the event of a code.  [name] states he would not want the ventilator. I explained that if we coded him he would most likely have chest compressions, defibrillation and ventilation in the ICU.    [name] attempted to ask the patient if he would want chest compressions to try to "save" him.  I attempted to explain that if there are reversible conditions heroic measures may make sense - but if he wants to avoid ICU and ventilation - heroic measures are not beneficial in a person with conditions that are not reversible.    [name] asked if he would want CPR, and Mr. Tugwell replied "I don't know".    Hard Choices book given to [place].      Assessment: Lovely family.  Well supported elderly gentleman.   Advanced AS, NSTEMI, Advancing chronic kidney disease.  Patient unable to have invasive procedures such as cardiac cath.   Patient Profile/HPI:  86 y.o. male  with past medical history of a fib, aortic stenosis, diastolic heart failure, HTN, T2DM, PVD, bilateral AKA in Aug 2020 with bilateral prosthesis, and hypothyroidism admitted on 10/04/2019 with chest pain.  Diagnosed with NSTEMI. Also found to have AKI on CKD. Patient not a good candidate  for cath or TAVR. Plan is for medication management only but some limitation in medical therapy d/t aortic stenosis and CKD. Some worsening mental status - now being treated for UTI. PMT consulted for GOC.   Length of Stay: 5   Vital Signs: BP (!) 153/63 (BP Location: Right Arm)   Pulse (!) 59   Temp 98.1 F (36.7 C) (Axillary)   Resp (!) 23   Ht 4' 8" (1.422 m)   Wt 99.5 kg   SpO2 94%   BMI 49.18 kg/m  SpO2: SpO2: 94 % O2 Device: O2 Device: Nasal Cannula O2 Flow Rate: O2 Flow Rate (L/min): 2 L/min       Palliative Assessment/Data: 30     Palliative Care Plan    Recommendations/Plan: PT evaluation today. Hopefully home tomorrow with supportive care from Home Health and family.  Palliative politely declined by family. Suppository x 1 today for constipation. Code status changed to DNI.  Code Status:  Limited code  Prognosis:  Less than 6 months would not be surprising.  At high risk for acute decline or rehospitalization.  Would benefit from Hospice services in the home if family was willing.    Discharge Planning: Home with Home Health  Care plan was discussed with patient and daughter.  Thank you for allowing the Palliative Medicine Team to assist in the care of this patient.  Total time spent:  25 min.     Greater than 50%  of this time was spent counseling and coordinating care related to the above assessment and plan.   , PA-C Palliative Medicine  Please contact Palliative MedicineTeam phone at  [phone] for questions and concerns between 7 am - 7 pm.   Please see AMION for individual provider pager numbers.       

## 2019-10-11 ENCOUNTER — Other Ambulatory Visit: Payer: Self-pay

## 2019-10-11 ENCOUNTER — Encounter: Payer: Medicare Other | Admitting: Physical Therapy

## 2019-10-11 ENCOUNTER — Telehealth: Payer: Self-pay | Admitting: Physician Assistant

## 2019-10-11 LAB — BASIC METABOLIC PANEL
Anion gap: 16 — ABNORMAL HIGH (ref 5–15)
BUN: 61 mg/dL — ABNORMAL HIGH (ref 8–23)
CO2: 21 mmol/L — ABNORMAL LOW (ref 22–32)
Calcium: 8.4 mg/dL — ABNORMAL LOW (ref 8.9–10.3)
Chloride: 93 mmol/L — ABNORMAL LOW (ref 98–111)
Creatinine, Ser: 2.91 mg/dL — ABNORMAL HIGH (ref 0.61–1.24)
GFR calc Af Amer: 21 mL/min — ABNORMAL LOW (ref >60)
GFR calc non Af Amer: 18 mL/min — ABNORMAL LOW (ref >60)
Glucose, Bld: 194 mg/dL — ABNORMAL HIGH (ref 70–99)
Potassium: 4.1 mmol/L (ref 3.5–5.1)
Sodium: 130 mmol/L — ABNORMAL LOW (ref 135–145)

## 2019-10-11 LAB — GLUCOSE, CAPILLARY: Glucose-Capillary: 189 mg/dL — ABNORMAL HIGH (ref 70–99)

## 2019-10-11 NOTE — Progress Notes (Addendum)
Progress Note  Patient Name: [name] Date of Encounter: 10/11/2019  Primary Cardiologist: Pixie Casino, MD  Subjective   States, "I feel pretty good today." As with previous encounters last week, he is not very conversant unless asked direct questions.  Inpatient Medications    Scheduled Meds: . amLODipine  2.5 mg Oral Daily  . apixaban  2.5 mg Oral BID  . atorvastatin  40 mg Oral Daily  . bisacodyl  10 mg Rectal Daily  . carvedilol  6.25 mg Oral BID WC  . Chlorhexidine Gluconate Cloth  6 each Topical Daily  . clopidogrel  75 mg Oral Daily  . finasteride  5 mg Oral QPM  . insulin aspart  0-5 Units Subcutaneous QHS  . insulin aspart  0-9 Units Subcutaneous TID WC  . latanoprost  1 drop Both Eyes QHS  . levothyroxine  75 mcg Oral Daily  . tamsulosin  0.4 mg Oral BID   Continuous Infusions: . cefTRIAXone (ROCEPHIN)  IV 1 g (10/10/19 0922)   PRN Meds: acetaminophen   Vital Signs    Vitals:   10/11/19 0011 10/11/19 0438 10/11/19 0445 10/11/19 0721  BP: (!) 149/68 (!) 150/68  (!) 158/71  Pulse: (!) 56 (!) 56  60  Resp: (!) 22 (!) 23  (!) 21  Temp: 98 F (36.7 C) 98.1 F (36.7 C)  98 F (36.7 C)  TempSrc: Oral Oral  Oral  SpO2: 92% 93%  94%  Weight:   98.6 kg   Height:        Intake/Output Summary (Last 24 hours) at 10/11/2019 0810 Last data filed at 10/11/2019 0400 Gross per 24 hour  Intake 320 ml  Output 850 ml  Net -530 ml   Last 3 Weights 10/11/2019 10/10/2019 10/09/2019  Weight (lbs) 217 lb 6 oz 219 lb 5.7 oz 217 lb 9.5 oz  Weight (kg) 98.6 kg 99.5 kg 98.7 kg     Telemetry    NSR with first degree AVB - Personally Reviewed  Physical Exam   GEN: No acute distress. Elderly. HEENT: Normocephalic, atraumatic, sclera non-icteric. Neck: No JVD or bruits. Cardiac: RRR with 3/6 SEM heard over entire precordium, most pronounced RUSB, unable to hear S2, no rubs or gallops.  Respiratory: Clear to auscultation bilaterally. Breathing is  unlabored. GI: Soft, nontender, non-distended, BS +x 4. MS: s/p bilateral lower extremity amputation Extremities: No clubbing or cyanosis. No obvious edema, s/p bilateral lower extremity amputation Neuro:  A+O to self, place, September, cannot name the year. Follows commands. Psych:  Flat affect, remains not voluntarily conversant aside from answering questions  Labs    High Sensitivity Troponin:   Recent Labs  Lab 10/04/19 1800 10/04/19 1910  TROPONINIHS 272* 572*      Cardiac EnzymesNo results for input(s): TROPONINI in the last 168 hours. No results for input(s): TROPIPOC in the last 168 hours.   Chemistry Recent Labs  Lab 10/04/19 1800 10/05/19 0333 10/09/19 0732 10/10/19 0401 10/11/19 0140  NA 133*   < > 132* 130* 130*  K 3.0*   < > 3.7 3.7 4.1  CL 94*   < > 94* 96* 93*  CO2 26   < > 26 21* 21*  GLUCOSE 229*   < > 198* 191* 194*  BUN 39*   < > 50* 56* 61*  CREATININE 2.72*   < > 3.00* 2.86* 2.91*  CALCIUM 9.0   < > 8.7* 8.4* 8.4*  PROT 7.0  --   --   --   --  ALBUMIN 3.2*  --   --   --   --   AST 26  --   --   --   --   ALT 17  --   --   --   --   ALKPHOS 61  --   --   --   --   BILITOT 0.6  --   --   --   --   GFRNONAA 20*   < > 17* 19* 18*  GFRAA 23*   < > 20* 21* 21*  ANIONGAP 13   < > 12 13 16*   < > = values in this interval not displayed.     Hematology Recent Labs  Lab 10/08/19 0747 10/09/19 0732 10/10/19 0401  WBC 14.0* 12.2* 11.1*  RBC 3.55* 3.29* 3.35*  HGB 11.2* 10.5* 10.9*  HCT 33.6* 31.0* 32.1*  MCV 94.6 94.2 95.8  MCH 31.5 31.9 32.5  MCHC 33.3 33.9 34.0  RDW 14.1 14.1 14.3  PLT 177 165 182    BNPNo results for input(s): BNP, PROBNP in the last 168 hours.   DDimer No results for input(s): DDIMER in the last 168 hours.   Radiology    DG Chest Port 1 View  Result Date: 10/09/2019 CLINICAL DATA:  Hypoxia. EXAM: PORTABLE CHEST 1 VIEW COMPARISON:  October 04, 2019 FINDINGS: Minimally enlarged cardiac silhouette. Calcific  atherosclerotic disease of the aorta. Mixed pattern pulmonary edema with likely bilateral pleural effusions. Left lower lobe more confluent airspace consolidation cannot be excluded. Osseous structures are without acute abnormality. Soft tissues are grossly normal. IMPRESSION: 1. Mixed pattern pulmonary edema with likely bilateral pleural effusions. 2. Left lower lobe more confluent airspace consolidation cannot be excluded. 3. Calcific atherosclerotic disease of the aorta. Electronically Signed   By: Fidela Salisbury M.D.   On: 10/09/2019 13:11    Cardiac Studies   2D Echo 09/04/19 1. Left ventricular ejection fraction, by estimation, is 55 to 60%. The  left ventricle has normal function. The left ventricle has no regional  wall motion abnormalities. There is moderate left ventricular hypertrophy.  Left ventricular diastolic  parameters are consistent with Grade I diastolic dysfunction (impaired  relaxation). Elevated left atrial pressure.  2. Right ventricular systolic function is normal. The right ventricular  size is normal.  3. The mitral valve is normal in structure. No evidence of mitral valve  regurgitation. No evidence of mitral stenosis.  4. The aortic valve has an indeterminant number of cusps. Aortic valve  regurgitation is not visualized. Moderate to severe aortic valve  stenosis.Aortic valve mean gradient measures 27.4 mmHg. Aortic valve peak  gradient measures 44.6 mmHg. Aortic valve  area, by VTI measures 0.77 cm.   Patient Profile     86 y.o.malewith history ofHTN, HLD, PAD s/p bilateral amputation,chronicdiastolic HF, moderate - severe AS, CKD stage III, paroxysmal atrial fibrillationonEliquis, sleep apnea (does not use CPAP), DM, RBBB/bifascicular block, bradycardiaadmitted 10/04/19 with chest pain/"indigestion,"found to have NSTEMI, AKI on CKD, and recurrent atrial fib with mild tachycardia.  Palliative care now following as well.  Assessment & Plan     1. NSTEMI -case discussed with interventional team last weel - given complexity, comorbidities and kidney failure, medical therapy recommended since he is high risk for invasive evaluation - aspirin discontinued given concomitant Eliquis - continue Plavix - continue BB, statin - repeat echocardiogram not pursued since it would not acutely change management - appreciate palliative medicine's involvement  2. AKI on CKD stage III -prior baseline  1.3, with admit Cr 2.7, up to 2.91 with hyponatremia - nephrology has been following along recommending conservative measures as he is not a candidate for HD - avoid nephrotoxic agentssuch as ACEI/ARB/ARNI/spironolactone  3. Moderate-severe aortic stenosis by echocardiogram(08/2019) - no plans for invasive evaluation as he is not a candidate for the workup involved in pre-TAVR planning such as cath and CTs due to worsening renal failure. Plan medical therapy. Avoid nitrates  4. Acute on chronic diastolic CHF - earlier in admission, diuretics held and given IV fluids - restarted on oral Lasix 9/23-9/25 with significant diuresis (-5L), on hold yesterday per nephrology who recommends to hold off for 24 hours - will review plans with MD - previously borderline O2 sats prior to diuresis but better now (94% RA)  5. PAF -patient was in AF on admission which could be contributing to symptoms, converted to NSR with 4.3sec post-termination pause then junctional brady followed by sinus brady after carvedilol 12.5mg  dose - tolerating carvedilol at 6.25mg  BID - Eliquis resumed cautiously with kidney issues  6. Essential HTN - BPrange HTN at times, consider titration of amlodipine - would not titrate carvedilol given prior bradycardia issues this admission  7.Poorly controlledDM - A1C 8.5 -> per IM  Tentatively had f/u on 10/21/19 to f/u in valve clinic but no need to keep this appt given no plans for TAVR. Have cancelled and r/s to 10/11 for TOC  appt with APP in [place] location (not on care team due to appt availability). I did notify pt's son of the appt change.  For questions or updates, please contact [place] Please consult [email or web address] for contact info under Cardiology/STEMI.  Signed, Charlie Pitter, PA-C 10/11/2019, 8:10 AM

## 2019-10-11 NOTE — Plan of Care (Signed)
  Problem: Education: Goal: Knowledge of General Education information will improve Description: Including pain rating scale, medication(s)/side effects and non-pharmacologic comfort measures Outcome: Adequate for Discharge   

## 2019-10-11 NOTE — Care Management Important Message (Signed)
Important Message  Patient Details  Name: [name] MRN: [id number] Date of Birth: 02-Feb-1927   Medicare Important Message Given:  Yes     Orbie Pyo 10/11/2019, 2:30 PM

## 2019-10-11 NOTE — Telephone Encounter (Signed)
    Attention TOC pool,  This patient will need a TOC phone call after discharge. They are being discharged likely in the next day or so. Follow-up appointment has already been arranged with: [name] on 10/11 They are a patient of Travis Casino, MD.  Family assists in care - son [name] & daughter [name].  Thank you! Charlie Pitter, PA-C

## 2019-10-11 NOTE — Progress Notes (Signed)
Discharge instructions (including medications) discussed with and copy provided to patient/caregiver 

## 2019-10-11 NOTE — Consult Note (Signed)
   [facility] Inpatient Consult   10/11/2019  [name] August 01, 1927 [id number]  [facility] [ACO] Patient: [employer]  Patient was assessed for [place] Management for [employer]. Patient was previously active with [facility].   Plan:  Have patient assigned to [place] Coordinator.  Of note, [facility] Care Management services does not replace or interfere with any services that are arranged by inpatient [facility] care management team.   For additional questions or referrals please contact:  Natividad Brood, RN BSN [facility] Liaison  [phone] business mobile phone Toll free office [phone]  Fax number: [phone] [email or web address]

## 2019-10-12 DIAGNOSIS — E785 Hyperlipidemia, unspecified: Secondary | ICD-10-CM | POA: Diagnosis not present

## 2019-10-12 DIAGNOSIS — I5032 Chronic diastolic (congestive) heart failure: Secondary | ICD-10-CM | POA: Diagnosis not present

## 2019-10-12 DIAGNOSIS — E1122 Type 2 diabetes mellitus with diabetic chronic kidney disease: Secondary | ICD-10-CM | POA: Diagnosis not present

## 2019-10-12 DIAGNOSIS — D631 Anemia in chronic kidney disease: Secondary | ICD-10-CM | POA: Diagnosis not present

## 2019-10-12 DIAGNOSIS — N183 Chronic kidney disease, stage 3 unspecified: Secondary | ICD-10-CM | POA: Diagnosis not present

## 2019-10-12 DIAGNOSIS — I350 Nonrheumatic aortic (valve) stenosis: Secondary | ICD-10-CM | POA: Diagnosis not present

## 2019-10-12 DIAGNOSIS — I130 Hypertensive heart and chronic kidney disease with heart failure and stage 1 through stage 4 chronic kidney disease, or unspecified chronic kidney disease: Secondary | ICD-10-CM | POA: Diagnosis not present

## 2019-10-12 DIAGNOSIS — I482 Chronic atrial fibrillation, unspecified: Secondary | ICD-10-CM | POA: Diagnosis not present

## 2019-10-12 DIAGNOSIS — I214 Non-ST elevation (NSTEMI) myocardial infarction: Secondary | ICD-10-CM | POA: Diagnosis not present

## 2019-10-12 DIAGNOSIS — M1990 Unspecified osteoarthritis, unspecified site: Secondary | ICD-10-CM | POA: Diagnosis not present

## 2019-10-12 DIAGNOSIS — E1151 Type 2 diabetes mellitus with diabetic peripheral angiopathy without gangrene: Secondary | ICD-10-CM | POA: Diagnosis not present

## 2019-10-12 DIAGNOSIS — Z4781 Encounter for orthopedic aftercare following surgical amputation: Secondary | ICD-10-CM | POA: Diagnosis not present

## 2019-10-12 DIAGNOSIS — E1165 Type 2 diabetes mellitus with hyperglycemia: Secondary | ICD-10-CM | POA: Diagnosis not present

## 2019-10-12 NOTE — Discharge Summary (Signed)
Physician Discharge Summary  [name] GQQ:[id number] DOB: 16-Apr-1927 DOA: 10/04/2019  PCP: Redmond School, MD  Admit date: 10/04/2019 Discharge date: 10/12/2019  Admitted From: Home Disposition: Home  Recommendations for Outpatient Follow-up:  1. Follow up with PCP in 1-2 weeks 2. Follow-up with cardiology as scheduled  Home Health: None Equipment/Devices: None  Discharge Condition: Stable CODE STATUS: Full Diet recommendation: As tolerated  Brief/Interim Summary: [name] a 86 y.o.malewith medical history significant foratrial fibrillation, aortic stenosis, chronic diastolic heart failure, hypertension, diabetes mellitus type 2, peripheral vascular disease,s/pbilateral AKA and has bilateral prosthesis in place, hypothyroidism who presents EMS to the emergency room with complaint of chest pain. Patient son is at bedside and helps provide history. Patient returned home from rehab/PTappointment this afternoon around 2:00pmand developed a substernal chest pressure and tightness in the upper chest. States he was not doing any particular activity when the chest pressure began. Reports he did not have chest pressure when he was at his physical therapy appointment earlier in the day. After sitting and resting it continued to occur. His son gave him a Pepcid for presumed indigestion but the pain did not improve. He then developed left shoulder and left upper arm pain and so was brought to the emergency room for further evaluation. He did not have any shortness of breath, nausea, vomiting or diaphoresis with the episode. Reports that the pressure was an 8-9/10at its worst and is currently solved. He was given aspirin by EMS in route to the hospital. Denies any complaints at this time. Hehas 1 son who lives with him during the week and his other children stay with him over the weekend he always has somebody at home with him. Reports he has been compliant with his  medications.In ED: [name] an elevated troponin level in the emergency room. Repeat troponin shows an increase from the initial level around 100 to over 500. His EKG shows no ST elevation. He is found to have AKI superimposed on his chronic kidney disease.ER provider discussed with cardiology who is going to see patient in the emergency room and provide recommendations. Hospitalistservice is asked to evaluate and help manage patient.  Patient met as above with acute substernal pressure, noted to have AKI from baseline CKD 2 as well as minimally elevated troponin.  Patient's troponin was thought to be cardiorenal in nature, subsequently became somewhat hypoxic in the setting of volume overload while attempting to improve patient's AKI in the setting of dehydration.  Cardiology and nephrology were consulted for further insight and recommendations, ultimately patient was deemed not appropriate for cardiac intervention or valve replacement or catheterization given his advanced age and worsening kidney function.  Nephrology agreed that kidney function was likely progressing from baseline CKD 2 along with cardiorenal syndrome in the setting of moderate aortic stenosis and volume overload.  Patient also noted to have UTI, cultures from outpatient setting were copied to hospital team and patient completed 3 days of antibiotics during his hospital stay.  Patient's creatinine continued to improve slowly, he resolved from his UTI his hypoxia resolved with increased urine output.  At this time patient is otherwise stable and agreeable for discharge home, we discussed with family need for close follow-up with PCP, cardiology and nephrology as scheduled.  Discharge Diagnoses:  Principal Problem:   NSTEMI (non-ST elevated myocardial infarction) [name]) Active Problems:   Essential hypertension, benign   Moderate aortic stenosis   Chronic diastolic CHF (congestive heart failure) (HCC)   Atrial fibrillation,  chronic  Type 2 diabetes mellitus with vascular disease (HCC)   S/P BKA (below knee amputation) unilateral, right ([name])   Acute kidney injury superimposed on CKD ([name])   Hypokalemia   AKI (acute kidney injury) [name])   Aortic valve stenosis   Palliative care encounter    Discharge Instructions  Discharge Instructions    Call MD for:  difficulty breathing, headache or visual disturbances   Complete by: As directed    Call MD for:  temperature >100.4   Complete by: As directed    Diet - low sodium heart healthy   Complete by: As directed    Increase activity slowly   Complete by: As directed    Increase activity slowly   Complete by: As directed    No wound care   Complete by: As directed    No wound care   Complete by: As directed      Allergies as of 10/11/2019      Reactions   Iodinated Diagnostic Agents Other (See Comments)   caused Fever   Dye Fdc Red [red Dye] Hives   Tolerated red Docusate, Niferex      Medication List    STOP taking these medications   aspirin EC 81 MG tablet   diclofenac Sodium 1 % Gel Commonly known as: VOLTAREN   methocarbamol 500 MG tablet Commonly known as: ROBAXIN   metoprolol tartrate 25 MG tablet Commonly known as: LOPRESSOR   potassium chloride 10 MEQ tablet Commonly known as: KLOR-CON   tamsulosin 0.4 MG Caps capsule Commonly known as: FLOMAX     TAKE these medications   acetaminophen 500 MG tablet Commonly known as: TYLENOL Take 500 mg by mouth every 6 (six) hours as needed for headache (pain).   amLODipine 2.5 MG tablet Commonly known as: NORVASC Take 5 mg by mouth every evening.   atorvastatin 40 MG tablet Commonly known as: LIPITOR TAKE 1 TABLET BY MOUTH EVERY DAY What changed: when to take this   carvedilol 6.25 MG tablet Commonly known as: COREG Take 1 tablet (6.25 mg total) by mouth 2 (two) times daily with a meal.   clopidogrel 75 MG tablet Commonly known as: PLAVIX Take 1 tablet (75 mg total) by  mouth daily with breakfast.   dorzolamide-timolol 22.3-6.8 MG/ML ophthalmic solution Commonly known as: COSOPT Place 1 drop into both eyes 2 (two) times daily.   Eliquis 5 MG Tabs tablet Generic drug: apixaban Take 2.5 mg by mouth 2 (two) times daily.   famotidine 20 MG tablet Commonly known as: PEPCID Take 1 tablet (20 mg total) by mouth daily.   ferrous sulfate 325 (65 FE) MG tablet Take 325 mg by mouth 2 (two) times daily with a meal.   finasteride 5 MG tablet Commonly known as: PROSCAR Take 1 tablet (5 mg total) by mouth every evening.   furosemide 40 MG tablet Commonly known as: LASIX Take 20-40 mg by mouth See admin instructions. Take one tablet (40 mg) by mouth every morning and 1/2 tablet (20 mg) every evening   glipiZIDE 5 MG tablet Commonly known as: GLUCOTROL Take 5 mg by mouth 2 (two) times daily.   GLUCOSAMINE CHOND MSM FORMULA PO Take 1 tablet by mouth 2 (two) times daily.   latanoprost 0.005 % ophthalmic solution Commonly known as: XALATAN Place 1 drop into both eyes at bedtime.   levothyroxine 75 MCG tablet Commonly known as: SYNTHROID Take 75 mcg by mouth daily before breakfast.   OneTouch Ultra test strip Generic drug:  glucose blood 1 each 2 (two) times daily.   polyethylene glycol 17 g packet Commonly known as: MiraLax Take 17 g by mouth daily as needed for moderate constipation.   Potassium Gluconate 550 (90 K) MG Tabs Take 90 mg by mouth 2 (two) times daily.   pyridoxine 100 MG tablet Commonly known as: B-6 Take 100 mg by mouth daily.   traMADol 50 MG tablet Commonly known as: Ultram Take 1 tablet (50 mg total) by mouth every 6 (six) hours as needed. What changed: reasons to take this   vitamin C 1000 MG tablet Take 1,000 mg by mouth daily.   Vitamin D-3 125 MCG (5000 UT) Tabs Take 5,000 Units by mouth every evening.   vitamin E 180 MG (400 UNITS) capsule Take 400 Units by mouth daily.       Follow-up Information    Health,  [facility] Follow up.   Specialty: Home Health Services Why: Physical therapy will come to home within 24-48 hours of discharge       Barrett, Evelene Croon, PA-C Follow up.   Specialties: Cardiology, Radiology Why: [facility] location - a follow-up has been arranged for you on Monday Oct 25, 2019 at 3:15 PM (Arrive by 3:00 PM). We have cancelled the valve appointment you had with Dr. Angelena Form on Oct 7. Contact information: [place] [address] [phone]              Allergies  Allergen Reactions  . Iodinated Diagnostic Agents Other (See Comments)    caused Fever  . Dye Fdc Red [Red Dye] Hives    Tolerated red Docusate, Niferex    Consultations:  Nephrology, cardiology   Procedures/Studies: US RENAL  Result Date: 10/07/2019 CLINICAL DATA:  Acute renal injury EXAM: RENAL / URINARY TRACT ULTRASOUND COMPLETE COMPARISON:  12/16/2011 FINDINGS: Right Kidney: Renal measurements: 8.9 x 3.2 x 3.2 cm. = volume: 47 mL. 1.9 cm cyst is noted in the lower pole new from the prior exam. Diffuse cortical thinning is seen. No hydronephrosis is noted. Left Kidney: Renal measurements: 9.4 x 4.9 x 3.3 cm. = volume: 78.5 mL. Echogenicity within normal limits. No mass or hydronephrosis visualized. Bladder: Decompressed Other: None. IMPRESSION: Right renal cyst. Right-sided cortical thinning.  No new acute abnormality is noted. Electronically Signed   By: Inez Catalina M.D.   On: 10/07/2019 20:45   DG Chest Port 1 View  Result Date: 10/09/2019 CLINICAL DATA:  Hypoxia. EXAM: PORTABLE CHEST 1 VIEW COMPARISON:  October 04, 2019 FINDINGS: Minimally enlarged cardiac silhouette. Calcific atherosclerotic disease of the aorta. Mixed pattern pulmonary edema with likely bilateral pleural effusions. Left lower lobe more confluent airspace consolidation cannot be excluded. Osseous structures are without acute abnormality. Soft tissues are grossly normal.  IMPRESSION: 1. Mixed pattern pulmonary edema with likely bilateral pleural effusions. 2. Left lower lobe more confluent airspace consolidation cannot be excluded. 3. Calcific atherosclerotic disease of the aorta. Electronically Signed   By: Fidela Salisbury M.D.   On: 10/09/2019 13:11   DG Chest Port 1 View  Result Date: 10/04/2019 CLINICAL DATA:  Chest pain since 2 p.m. EXAM: PORTABLE CHEST 1 VIEW COMPARISON:  12/09/2018 FINDINGS: Single frontal view of the chest demonstrates an unremarkable cardiac silhouette. There is small residual left pleural effusion. No airspace disease or pneumothorax. No acute bony abnormalities. IMPRESSION: 1. Small residual left pleural effusion, markedly decreased since prior study. 2. No acute airspace disease. Electronically Signed   By: Legrand Como  Owens Shark M.D.   On: 10/04/2019 17:12      Subjective: No acute issues or events overnight, denies nausea, vomiting, diarrhea, constipation, headache, fevers, chills.   Discharge Exam: Vitals:   10/11/19 0438 10/11/19 0721  BP: (!) 150/68 (!) 158/71  Pulse: (!) 56 60  Resp: (!) 23 (!) 21  Temp: 98.1 F (36.7 C) 98 F (36.7 C)  SpO2: 93% 94%   Vitals:   10/11/19 0011 10/11/19 0438 10/11/19 0445 10/11/19 0721  BP: (!) 149/68 (!) 150/68  (!) 158/71  Pulse: (!) 56 (!) 56  60  Resp: (!) 22 (!) 23  (!) 21  Temp: 98 F (36.7 C) 98.1 F (36.7 C)  98 F (36.7 C)  TempSrc: Oral Oral  Oral  SpO2: 92% 93%  94%  Weight:   98.6 kg   Height:        General: Pt is alert, awake, not in acute distress Cardiovascular: RRR, S1/S2 +, no rubs, no gallops Respiratory: CTA bilaterally, no wheezing, no rhonchi Abdominal: Soft, NT, ND, bowel sounds + Extremities: no edema, no cyanosis    The results of significant diagnostics from this hospitalization (including imaging, microbiology, ancillary and laboratory) are listed below for reference.     Microbiology: Recent Results (from the past 240 hour(s))  SARS Coronavirus  2 by RT PCR (hospital order, performed in [facility] hospital lab) Nasopharyngeal Nasopharyngeal Swab     Status: None   Collection Time: 10/05/19 12:27 AM   Specimen: Nasopharyngeal Swab  Result Value Ref Range Status   SARS Coronavirus 2 NEGATIVE NEGATIVE Final    Comment: (NOTE) SARS-CoV-2 target nucleic acids are NOT DETECTED.  The SARS-CoV-2 RNA is generally detectable in upper and lower respiratory specimens during the acute phase of infection. The lowest concentration of SARS-CoV-2 viral copies this assay can detect is 250 copies / mL. A negative result does not preclude SARS-CoV-2 infection and should not be used as the sole basis for treatment or other patient management decisions.  A negative result may occur with improper specimen collection / handling, submission of specimen other than nasopharyngeal swab, presence of viral mutation(s) within the areas targeted by this assay, and inadequate number of viral copies (<250 copies / mL). A negative result must be combined with clinical observations, patient history, and epidemiological information.  Fact Sheet for Patients:   StrictlyIdeas.no  Fact Sheet for Healthcare Providers: BankingDealers.co.za  This test is not yet approved or  cleared by the [place] FDA and has been authorized for detection and/or diagnosis of SARS-CoV-2 by FDA under an Emergency Use Authorization (EUA).  This EUA will remain in effect (meaning this test can be used) for the duration of the COVID-19 declaration under Section 564(b)(1) of the Act, 21 U.S.C. section 360bbb-3(b)(1), unless the authorization is terminated or revoked sooner.  Performed at [facility], [place] [address], [place], [place] [address]   Urine Culture     Status: Abnormal   Collection Time: 10/09/19 12:05 PM   Specimen: Urine, Catheterized  Result Value Ref Range Status   Specimen Description URINE, CATHETERIZED   Final   Special Requests   Final    NONE Performed at [facility], [place] [address] [place], [place] [address]    Culture MULTIPLE SPECIES PRESENT, SUGGEST RECOLLECTION (A)  Final   Report Status 10/10/2019 FINAL  Final     Labs: BNP (last 3 results) Recent Labs    11/09/18 1209 11/18/18 2132 12/09/18 0343  BNP  258.0* 213.1* 696.2*   Basic Metabolic Panel: Recent Labs  Lab 10/07/19 0416 10/08/19 0747 10/09/19 0732 10/10/19 0401 10/11/19 0140  NA 133* 129* 132* 130* 130*  K 4.4 3.8 3.7 3.7 4.1  CL 96* 93* 94* 96* 93*  CO2 24 22 26  21* 21*  GLUCOSE 208* 199* 198* 191* 194*  BUN 46* 45* 50* 56* 61*  CREATININE 2.73* 2.76* 3.00* 2.86* 2.91*  CALCIUM 8.9 8.8* 8.7* 8.4* 8.4*   Liver Function Tests: No results for input(s): AST, ALT, ALKPHOS, BILITOT, PROT, ALBUMIN in the last 168 hours. No results for input(s): LIPASE, AMYLASE in the last 168 hours. No results for input(s): AMMONIA in the last 168 hours. CBC: Recent Labs  Lab 10/06/19 1430 10/07/19 0416 10/08/19 0747 10/09/19 0732 10/10/19 0401  WBC 8.6 10.3 14.0* 12.2* 11.1*  HGB 11.6* 11.2* 11.2* 10.5* 10.9*  HCT 34.1* 33.5* 33.6* 31.0* 32.1*  MCV 94.7 95.7 94.6 94.2 95.8  PLT 183 186 177 165 182   Cardiac Enzymes: No results for input(s): CKTOTAL, CKMB, CKMBINDEX, TROPONINI in the last 168 hours. BNP: Invalid input(s): POCBNP CBG: Recent Labs  Lab 10/10/19 0739 10/10/19 1140 10/10/19 1705 10/10/19 2116 10/11/19 0719  GLUCAP 187* 246* 196* 235* 189*   D-Dimer No results for input(s): DDIMER in the last 72 hours. Hgb A1c No results for input(s): HGBA1C in the last 72 hours. Lipid Profile No results for input(s): CHOL, HDL, LDLCALC, TRIG, CHOLHDL, LDLDIRECT in the last 72 hours. Thyroid function studies No results for input(s): TSH, T4TOTAL, T3FREE, THYROIDAB in the last 72 hours.  Invalid input(s): FREET3 Anemia work up No results for input(s): VITAMINB12, FOLATE, FERRITIN, TIBC, IRON,  RETICCTPCT in the last 72 hours. Urinalysis    Component Value Date/Time   COLORURINE YELLOW 10/08/2019 1654   APPEARANCEUR HAZY (A) 10/08/2019 1654   LABSPEC 1.008 10/08/2019 1654   PHURINE 5.0 10/08/2019 1654   GLUCOSEU NEGATIVE 10/08/2019 1654   HGBUR MODERATE (A) 10/08/2019 1654   BILIRUBINUR NEGATIVE 10/08/2019 1654   KETONESUR NEGATIVE 10/08/2019 1654   PROTEINUR 30 (A) 10/08/2019 1654   NITRITE NEGATIVE 10/08/2019 1654   LEUKOCYTESUR MODERATE (A) 10/08/2019 1654   Sepsis Labs Invalid input(s): PROCALCITONIN,  WBC,  LACTICIDVEN Microbiology Recent Results (from the past 240 hour(s))  SARS Coronavirus 2 by RT PCR (hospital order, performed in [place] [facility] hospital lab) Nasopharyngeal Nasopharyngeal Swab     Status: None   Collection Time: 10/05/19 12:27 AM   Specimen: Nasopharyngeal Swab  Result Value Ref Range Status   SARS Coronavirus 2 NEGATIVE NEGATIVE Final    Comment: (NOTE) SARS-CoV-2 target nucleic acids are NOT DETECTED.  The SARS-CoV-2 RNA is generally detectable in upper and lower respiratory specimens during the acute phase of infection. The lowest concentration of SARS-CoV-2 viral copies this assay can detect is 250 copies / mL. A negative result does not preclude SARS-CoV-2 infection and should not be used as the sole basis for treatment or other patient management decisions.  A negative result may occur with improper specimen collection / handling, submission of specimen other than nasopharyngeal swab, presence of viral mutation(s) within the areas targeted by this assay, and inadequate number of viral copies (<250 copies / mL). A negative result must be combined with clinical observations, patient history, and epidemiological information.  Fact Sheet for Patients:   StrictlyIdeas.no  Fact Sheet for Healthcare Providers: BankingDealers.co.za  This test is not yet approved or  cleared by the [place]  FDA and has been authorized for  detection and/or diagnosis of SARS-CoV-2 by FDA under an Emergency Use Authorization (EUA).  This EUA will remain in effect (meaning this test can be used) for the duration of the COVID-19 declaration under Section 564(b)(1) of the Act, 21 U.S.C. section 360bbb-3(b)(1), unless the authorization is terminated or revoked sooner.  Performed at [facility], [place] [address], [place], [place] [address]   Urine Culture     Status: Abnormal   Collection Time: 10/09/19 12:05 PM   Specimen: Urine, Catheterized  Result Value Ref Range Status   Specimen Description URINE, CATHETERIZED  Final   Special Requests   Final    NONE Performed at [facility], [place] [address], [place], [place] [id number]    Culture MULTIPLE SPECIES PRESENT, SUGGEST RECOLLECTION (A)  Final   Report Status 10/10/2019 FINAL  Final     Time coordinating discharge: Over 30 minutes  SIGNED:   Little Ishikawa, DO [facility] 10/12/2019, 3:37 PM Pager   If 7PM-7AM, please contact night-coverage [email or web address]

## 2019-10-13 ENCOUNTER — Encounter: Payer: Medicare Other | Admitting: Physical Therapy

## 2019-10-13 ENCOUNTER — Other Ambulatory Visit: Payer: Self-pay | Admitting: *Deleted

## 2019-10-13 NOTE — Patient Outreach (Signed)
[facility]) Care Management  10/13/2019  [name] 03/25/1927 [id number]   Referral received as member was recently discharged from hospital after having NSTEMI.  Member was active with health coach prior to admission.  Call placed to daughter, [name], to follow up on discharge.  She state this is not a good time to talk as she is currently at work.  Will send member/family outreach letter and follow up within the next 3-4 business days.  [name], [place], MSN [place] [phone]

## 2019-10-13 NOTE — Telephone Encounter (Signed)
TOC call completed with pt's daughter [name] (okay per DPR). Daughter verbalizes understanding of discharge instructions, changes to medications, signs and symptoms which require contacting our office, and the location, date and time of her follow up appointment. All questions addressed.

## 2019-10-13 NOTE — Telephone Encounter (Addendum)
TOC attempt x1  Spoke to daughter (ok per DPR)-request to call back after 4.

## 2019-10-14 DIAGNOSIS — I214 Non-ST elevation (NSTEMI) myocardial infarction: Secondary | ICD-10-CM | POA: Diagnosis not present

## 2019-10-14 DIAGNOSIS — Z681 Body mass index (BMI) 19 or less, adult: Secondary | ICD-10-CM | POA: Diagnosis not present

## 2019-10-14 DIAGNOSIS — I350 Nonrheumatic aortic (valve) stenosis: Secondary | ICD-10-CM | POA: Diagnosis not present

## 2019-10-14 DIAGNOSIS — I129 Hypertensive chronic kidney disease with stage 1 through stage 4 chronic kidney disease, or unspecified chronic kidney disease: Secondary | ICD-10-CM | POA: Diagnosis not present

## 2019-10-14 DIAGNOSIS — R079 Chest pain, unspecified: Secondary | ICD-10-CM | POA: Diagnosis not present

## 2019-10-14 DIAGNOSIS — E7849 Other hyperlipidemia: Secondary | ICD-10-CM | POA: Diagnosis not present

## 2019-10-14 DIAGNOSIS — E118 Type 2 diabetes mellitus with unspecified complications: Secondary | ICD-10-CM | POA: Diagnosis not present

## 2019-10-14 DIAGNOSIS — I4891 Unspecified atrial fibrillation: Secondary | ICD-10-CM | POA: Diagnosis not present

## 2019-10-14 DIAGNOSIS — E1122 Type 2 diabetes mellitus with diabetic chronic kidney disease: Secondary | ICD-10-CM | POA: Diagnosis not present

## 2019-10-15 DIAGNOSIS — M1990 Unspecified osteoarthritis, unspecified site: Secondary | ICD-10-CM | POA: Diagnosis not present

## 2019-10-15 DIAGNOSIS — D631 Anemia in chronic kidney disease: Secondary | ICD-10-CM | POA: Diagnosis not present

## 2019-10-15 DIAGNOSIS — E1165 Type 2 diabetes mellitus with hyperglycemia: Secondary | ICD-10-CM | POA: Diagnosis not present

## 2019-10-15 DIAGNOSIS — I5032 Chronic diastolic (congestive) heart failure: Secondary | ICD-10-CM | POA: Diagnosis not present

## 2019-10-15 DIAGNOSIS — Z4781 Encounter for orthopedic aftercare following surgical amputation: Secondary | ICD-10-CM | POA: Diagnosis not present

## 2019-10-15 DIAGNOSIS — E785 Hyperlipidemia, unspecified: Secondary | ICD-10-CM | POA: Diagnosis not present

## 2019-10-15 DIAGNOSIS — E1151 Type 2 diabetes mellitus with diabetic peripheral angiopathy without gangrene: Secondary | ICD-10-CM | POA: Diagnosis not present

## 2019-10-15 DIAGNOSIS — I214 Non-ST elevation (NSTEMI) myocardial infarction: Secondary | ICD-10-CM | POA: Diagnosis not present

## 2019-10-15 DIAGNOSIS — N183 Chronic kidney disease, stage 3 unspecified: Secondary | ICD-10-CM | POA: Diagnosis not present

## 2019-10-15 DIAGNOSIS — I130 Hypertensive heart and chronic kidney disease with heart failure and stage 1 through stage 4 chronic kidney disease, or unspecified chronic kidney disease: Secondary | ICD-10-CM | POA: Diagnosis not present

## 2019-10-15 DIAGNOSIS — I350 Nonrheumatic aortic (valve) stenosis: Secondary | ICD-10-CM | POA: Diagnosis not present

## 2019-10-15 DIAGNOSIS — I482 Chronic atrial fibrillation, unspecified: Secondary | ICD-10-CM | POA: Diagnosis not present

## 2019-10-15 DIAGNOSIS — E1122 Type 2 diabetes mellitus with diabetic chronic kidney disease: Secondary | ICD-10-CM | POA: Diagnosis not present

## 2019-10-18 ENCOUNTER — Encounter: Payer: Medicare Other | Admitting: Physical Therapy

## 2019-10-18 DIAGNOSIS — E1122 Type 2 diabetes mellitus with diabetic chronic kidney disease: Secondary | ICD-10-CM | POA: Diagnosis not present

## 2019-10-18 DIAGNOSIS — I350 Nonrheumatic aortic (valve) stenosis: Secondary | ICD-10-CM | POA: Diagnosis not present

## 2019-10-18 DIAGNOSIS — I214 Non-ST elevation (NSTEMI) myocardial infarction: Secondary | ICD-10-CM | POA: Diagnosis not present

## 2019-10-18 DIAGNOSIS — I130 Hypertensive heart and chronic kidney disease with heart failure and stage 1 through stage 4 chronic kidney disease, or unspecified chronic kidney disease: Secondary | ICD-10-CM | POA: Diagnosis not present

## 2019-10-18 DIAGNOSIS — I5032 Chronic diastolic (congestive) heart failure: Secondary | ICD-10-CM | POA: Diagnosis not present

## 2019-10-18 DIAGNOSIS — E1151 Type 2 diabetes mellitus with diabetic peripheral angiopathy without gangrene: Secondary | ICD-10-CM | POA: Diagnosis not present

## 2019-10-18 DIAGNOSIS — I482 Chronic atrial fibrillation, unspecified: Secondary | ICD-10-CM | POA: Diagnosis not present

## 2019-10-18 DIAGNOSIS — E785 Hyperlipidemia, unspecified: Secondary | ICD-10-CM | POA: Diagnosis not present

## 2019-10-18 DIAGNOSIS — M1990 Unspecified osteoarthritis, unspecified site: Secondary | ICD-10-CM | POA: Diagnosis not present

## 2019-10-18 DIAGNOSIS — E1165 Type 2 diabetes mellitus with hyperglycemia: Secondary | ICD-10-CM | POA: Diagnosis not present

## 2019-10-18 DIAGNOSIS — D631 Anemia in chronic kidney disease: Secondary | ICD-10-CM | POA: Diagnosis not present

## 2019-10-18 DIAGNOSIS — Z4781 Encounter for orthopedic aftercare following surgical amputation: Secondary | ICD-10-CM | POA: Diagnosis not present

## 2019-10-18 DIAGNOSIS — N183 Chronic kidney disease, stage 3 unspecified: Secondary | ICD-10-CM | POA: Diagnosis not present

## 2019-10-19 ENCOUNTER — Other Ambulatory Visit: Payer: Self-pay | Admitting: *Deleted

## 2019-10-19 DIAGNOSIS — E1151 Type 2 diabetes mellitus with diabetic peripheral angiopathy without gangrene: Secondary | ICD-10-CM | POA: Diagnosis not present

## 2019-10-19 DIAGNOSIS — E1165 Type 2 diabetes mellitus with hyperglycemia: Secondary | ICD-10-CM | POA: Diagnosis not present

## 2019-10-19 DIAGNOSIS — E1122 Type 2 diabetes mellitus with diabetic chronic kidney disease: Secondary | ICD-10-CM | POA: Diagnosis not present

## 2019-10-19 DIAGNOSIS — I214 Non-ST elevation (NSTEMI) myocardial infarction: Secondary | ICD-10-CM | POA: Diagnosis not present

## 2019-10-19 DIAGNOSIS — I5032 Chronic diastolic (congestive) heart failure: Secondary | ICD-10-CM | POA: Diagnosis not present

## 2019-10-19 DIAGNOSIS — M1990 Unspecified osteoarthritis, unspecified site: Secondary | ICD-10-CM | POA: Diagnosis not present

## 2019-10-19 DIAGNOSIS — Z4781 Encounter for orthopedic aftercare following surgical amputation: Secondary | ICD-10-CM | POA: Diagnosis not present

## 2019-10-19 DIAGNOSIS — I482 Chronic atrial fibrillation, unspecified: Secondary | ICD-10-CM | POA: Diagnosis not present

## 2019-10-19 DIAGNOSIS — D631 Anemia in chronic kidney disease: Secondary | ICD-10-CM | POA: Diagnosis not present

## 2019-10-19 DIAGNOSIS — I130 Hypertensive heart and chronic kidney disease with heart failure and stage 1 through stage 4 chronic kidney disease, or unspecified chronic kidney disease: Secondary | ICD-10-CM | POA: Diagnosis not present

## 2019-10-19 DIAGNOSIS — N183 Chronic kidney disease, stage 3 unspecified: Secondary | ICD-10-CM | POA: Diagnosis not present

## 2019-10-19 DIAGNOSIS — I350 Nonrheumatic aortic (valve) stenosis: Secondary | ICD-10-CM | POA: Diagnosis not present

## 2019-10-19 DIAGNOSIS — E785 Hyperlipidemia, unspecified: Secondary | ICD-10-CM | POA: Diagnosis not present

## 2019-10-19 NOTE — Patient Outreach (Signed)
[facility]) Care Management  10/19/2019  SANEL STEMMER 12-08-1927 [id number]   Outreach/engagement attempt #2, successful to daughter [name].  Identity verified.  This care manager introduced self and stated purpose of call.  [facility] care management services explained.    Social: Per daughter, member lives alone but has family member's with him 24/7.  He uses a Administrator, arts and has required assistance with ADL's.  She report he is active with [facility] for PT, OT, and nursing.    Conditions: Per chart, has history of A-fib, CAD, HTN, DM, CHF, CKD, Hypothyroid, TIA, Aortic stenosis, and HLD.  Was admitted to hospital for NSTEMI, no cath due to kidney function, treating medically.  Also having issues with urinary retention, currently have foley catheter placed.    She report they are concerned currently with management of his diabetes.  His A1C has increased from 5.6 in January this year to 8.5 last month.  Denies any diet or diabetes medication changes.  Blood sugars have reportedly been 170-180s over the last couple days, which is better than 200-300's it had been prior to hospitalization.  Medications: Reviewed with daughter, report member has medications administered via pill box.  Denies any questions regarding meds or need for financial assistance.  Adherent to recent medication changes.  Appointments: Daughter report member has already seen PCP, was seen by urologist today, and will see cardiologist on 10/11.  Family provide transportation to appointments.    At end of conversation, call was disconnected.  Attempted to call back, no answer.    Plan: RN CM will follow up with daughter/family within the next week.  Goals Addressed            This Visit's Progress   . [facility] - Monitor and Manage My Blood Sugar       Follow Up Date 11/4   - check blood sugar at prescribed times - check blood sugar if I feel it is too high or too low - take the blood sugar  log to all doctor visits    Why is this important?   Checking your blood sugar at home helps to keep it from getting very high or very low.  Writing the results in a diary or log helps the doctor know how to care for you.  Your blood sugar log should have the time, date and the results.  Also, write down the amount of insulin or other medicine that you take.  Other information, like what you ate, exercise done and how you were feeling, will also be helpful.     Notes:     . [facility] - Set My Target A1C       Follow Up Date 12/4    - set target A1C (7)    Why is this important?   Your target A1C is decided together by you and your doctor.  It is based on several things like your age and other health issues.    Notes:     . [facility] -Improve My Heart Health       Follow Up Date 11/4   - be open to making changes - if I have chest pain, call for help    Why is this important?   Lifestyle changes are key to improving the blood flow to your heart. Think about the things you can change and set a goal to live healthy.  Remember, when the blood vessels to your heart start to get clogged you  may not have any symptoms.  Over time, they can get worse.  Don't ignore the signs, like chest pain, and get help right away.     Notes:       [name], RN, MSN [place] [phone]

## 2019-10-20 ENCOUNTER — Encounter: Payer: Medicare Other | Admitting: Physical Therapy

## 2019-10-20 DIAGNOSIS — D631 Anemia in chronic kidney disease: Secondary | ICD-10-CM | POA: Diagnosis not present

## 2019-10-20 DIAGNOSIS — I482 Chronic atrial fibrillation, unspecified: Secondary | ICD-10-CM | POA: Diagnosis not present

## 2019-10-20 DIAGNOSIS — M1990 Unspecified osteoarthritis, unspecified site: Secondary | ICD-10-CM | POA: Diagnosis not present

## 2019-10-20 DIAGNOSIS — E785 Hyperlipidemia, unspecified: Secondary | ICD-10-CM | POA: Diagnosis not present

## 2019-10-20 DIAGNOSIS — E1165 Type 2 diabetes mellitus with hyperglycemia: Secondary | ICD-10-CM | POA: Diagnosis not present

## 2019-10-20 DIAGNOSIS — I5032 Chronic diastolic (congestive) heart failure: Secondary | ICD-10-CM | POA: Diagnosis not present

## 2019-10-20 DIAGNOSIS — I350 Nonrheumatic aortic (valve) stenosis: Secondary | ICD-10-CM | POA: Diagnosis not present

## 2019-10-20 DIAGNOSIS — Z4781 Encounter for orthopedic aftercare following surgical amputation: Secondary | ICD-10-CM | POA: Diagnosis not present

## 2019-10-20 DIAGNOSIS — E1122 Type 2 diabetes mellitus with diabetic chronic kidney disease: Secondary | ICD-10-CM | POA: Diagnosis not present

## 2019-10-20 DIAGNOSIS — N183 Chronic kidney disease, stage 3 unspecified: Secondary | ICD-10-CM | POA: Diagnosis not present

## 2019-10-20 DIAGNOSIS — I130 Hypertensive heart and chronic kidney disease with heart failure and stage 1 through stage 4 chronic kidney disease, or unspecified chronic kidney disease: Secondary | ICD-10-CM | POA: Diagnosis not present

## 2019-10-20 DIAGNOSIS — E1151 Type 2 diabetes mellitus with diabetic peripheral angiopathy without gangrene: Secondary | ICD-10-CM | POA: Diagnosis not present

## 2019-10-20 DIAGNOSIS — I214 Non-ST elevation (NSTEMI) myocardial infarction: Secondary | ICD-10-CM | POA: Diagnosis not present

## 2019-10-21 ENCOUNTER — Encounter: Payer: Self-pay | Admitting: *Deleted

## 2019-10-21 ENCOUNTER — Ambulatory Visit: Payer: Medicare Other | Admitting: *Deleted

## 2019-10-21 ENCOUNTER — Ambulatory Visit: Payer: Medicare Other | Admitting: Cardiovascular Disease

## 2019-10-21 DIAGNOSIS — I5032 Chronic diastolic (congestive) heart failure: Secondary | ICD-10-CM | POA: Diagnosis not present

## 2019-10-21 DIAGNOSIS — Z4781 Encounter for orthopedic aftercare following surgical amputation: Secondary | ICD-10-CM | POA: Diagnosis not present

## 2019-10-21 DIAGNOSIS — I482 Chronic atrial fibrillation, unspecified: Secondary | ICD-10-CM | POA: Diagnosis not present

## 2019-10-21 DIAGNOSIS — E785 Hyperlipidemia, unspecified: Secondary | ICD-10-CM | POA: Diagnosis not present

## 2019-10-21 DIAGNOSIS — E1151 Type 2 diabetes mellitus with diabetic peripheral angiopathy without gangrene: Secondary | ICD-10-CM | POA: Diagnosis not present

## 2019-10-21 DIAGNOSIS — I130 Hypertensive heart and chronic kidney disease with heart failure and stage 1 through stage 4 chronic kidney disease, or unspecified chronic kidney disease: Secondary | ICD-10-CM | POA: Diagnosis not present

## 2019-10-21 DIAGNOSIS — N183 Chronic kidney disease, stage 3 unspecified: Secondary | ICD-10-CM | POA: Diagnosis not present

## 2019-10-21 DIAGNOSIS — M1990 Unspecified osteoarthritis, unspecified site: Secondary | ICD-10-CM | POA: Diagnosis not present

## 2019-10-21 DIAGNOSIS — I350 Nonrheumatic aortic (valve) stenosis: Secondary | ICD-10-CM | POA: Diagnosis not present

## 2019-10-21 DIAGNOSIS — I214 Non-ST elevation (NSTEMI) myocardial infarction: Secondary | ICD-10-CM | POA: Diagnosis not present

## 2019-10-21 DIAGNOSIS — D631 Anemia in chronic kidney disease: Secondary | ICD-10-CM | POA: Diagnosis not present

## 2019-10-21 DIAGNOSIS — E1165 Type 2 diabetes mellitus with hyperglycemia: Secondary | ICD-10-CM | POA: Diagnosis not present

## 2019-10-21 DIAGNOSIS — E1122 Type 2 diabetes mellitus with diabetic chronic kidney disease: Secondary | ICD-10-CM | POA: Diagnosis not present

## 2019-10-22 DIAGNOSIS — D631 Anemia in chronic kidney disease: Secondary | ICD-10-CM | POA: Diagnosis not present

## 2019-10-22 DIAGNOSIS — I5032 Chronic diastolic (congestive) heart failure: Secondary | ICD-10-CM | POA: Diagnosis not present

## 2019-10-22 DIAGNOSIS — M1990 Unspecified osteoarthritis, unspecified site: Secondary | ICD-10-CM | POA: Diagnosis not present

## 2019-10-22 DIAGNOSIS — I130 Hypertensive heart and chronic kidney disease with heart failure and stage 1 through stage 4 chronic kidney disease, or unspecified chronic kidney disease: Secondary | ICD-10-CM | POA: Diagnosis not present

## 2019-10-22 DIAGNOSIS — E1151 Type 2 diabetes mellitus with diabetic peripheral angiopathy without gangrene: Secondary | ICD-10-CM | POA: Diagnosis not present

## 2019-10-22 DIAGNOSIS — I214 Non-ST elevation (NSTEMI) myocardial infarction: Secondary | ICD-10-CM | POA: Diagnosis not present

## 2019-10-22 DIAGNOSIS — N183 Chronic kidney disease, stage 3 unspecified: Secondary | ICD-10-CM | POA: Diagnosis not present

## 2019-10-22 DIAGNOSIS — E1122 Type 2 diabetes mellitus with diabetic chronic kidney disease: Secondary | ICD-10-CM | POA: Diagnosis not present

## 2019-10-22 DIAGNOSIS — I482 Chronic atrial fibrillation, unspecified: Secondary | ICD-10-CM | POA: Diagnosis not present

## 2019-10-22 DIAGNOSIS — E1165 Type 2 diabetes mellitus with hyperglycemia: Secondary | ICD-10-CM | POA: Diagnosis not present

## 2019-10-22 DIAGNOSIS — Z4781 Encounter for orthopedic aftercare following surgical amputation: Secondary | ICD-10-CM | POA: Diagnosis not present

## 2019-10-22 DIAGNOSIS — I350 Nonrheumatic aortic (valve) stenosis: Secondary | ICD-10-CM | POA: Diagnosis not present

## 2019-10-22 DIAGNOSIS — E785 Hyperlipidemia, unspecified: Secondary | ICD-10-CM | POA: Diagnosis not present

## 2019-10-24 NOTE — Progress Notes (Signed)
Cardiology Office Note   Date:  10/25/2019   ID:  [name], DOB December 07, 1927, MRN [id number]  PCP:  Redmond School, MD Cardiologist:  Pixie Casino, MD 09/02/2019 Electrphysiologist: None Rosaria Ferries, PA-C    History of Present Illness: [name] is a 86 y.o. male with a history of DM, HTN, HLD, PAD s/p bilat BKA, AS, PAF, D-CHF, CKD III, OSA not on CPAP, RBBB, bradycardia, hypothyroid.  Admitted 09/20-09/28/2021 w/ NSTEMI, AKI on CKD, Afib. Med rx for MI, Palliative Care saw, not HD candidate, no TAVR possible  [name] presents for post-hospital f/u  The home BP machine is getting SBPs 180 or so, similar to the reading today. Both on automatic machines.   [facility] gets 120s-130s/70s, Dr Nolon Rod office also gets SBPs 120s. These are manual BP readings.   BP checked manually and by machine, after pt sitting a while. SBP 158 manual, 162 automatic, no significant difference, both elevated  He has not had CP since d/c. His activity level is not quite as high as previous, related to less rehab. He has not been walking, but is willing to start.   He has not been light-headed or dizzy.   No SOB w/ activity, although his activity level is low.   No intubation, they want everything else done.   COVID status: un-vaccinated, had/did not have COVID Past Medical History:  Diagnosis Date  . Arthritis   . Borderline diabetic   . CHF (congestive heart failure) ([place])   . Chronic kidney disease   . Diabetes mellitus without complication ([place])   . Dysrhythmia   . Glaucoma   . Hyperlipidemia   . Hypertension   . Hypothyroidism   . PVD (peripheral vascular disease) ([place])   . Sleep apnea    Cpap ordered but doesnt use  . Urinary retention due to benign prostatic hyperplasia 12/28/2018    Past Surgical History:  Procedure Laterality Date  . ABDOMINAL AORTOGRAM W/LOWER EXTREMITY N/A 07/27/2018   Procedure: ABDOMINAL AORTOGRAM W/LOWER EXTREMITY;  Surgeon:  Lorretta Harp, MD;  Location: [place] [facility];  Service: Cardiovascular;  Laterality: N/A;  . AMPUTATION Right 08/28/2018   Procedure: RIGHT BELOW KNEE AMPUTATION;  Surgeon: [name], MD;  Location: [place];  Service: Orthopedics;  Laterality: Right;  . AMPUTATION Left 12/02/2018   Procedure: LEFT TRANSMETATARSAL AMPUTATION AND NEGATIVE PRESSURE WOUND VAC PLACEMENT;  Surgeon: [name], MD;  Location: [place];  Service: Orthopedics;  Laterality: Left;  . AMPUTATION Left 12/18/2018   Procedure: LEFT BELOW KNEE AMPUTATION;  Surgeon: [name], MD;  Location: [place];  Service: Orthopedics;  Laterality: Left;  . APPENDECTOMY  1943  . BELOW KNEE LEG AMPUTATION Left 12/18/2018  . CATARACT EXTRACTION  2012   x2  . COLONOSCOPY N/A 11/01/2014   Procedure: COLONOSCOPY;  Surgeon: Aviva Signs, MD;  [facility];  Service: Gastroenterology;  Laterality: N/A;  . ESOPHAGOGASTRODUODENOSCOPY N/A 11/01/2014   Procedure: ESOPHAGOGASTRODUODENOSCOPY (EGD);  Surgeon: Aviva Signs, MD;  [facility];  Service: Gastroenterology;  Laterality: N/A;  . HERNIA REPAIR Left   . INGUINAL HERNIA REPAIR Right 03/01/2013   Procedure: RIGHT INGUINAL HERNIORRHAPHY;  Surgeon: Jamesetta So, MD;  Location: [facility];  Service: General;  Laterality: Right;  . INSERTION OF MESH Right 03/01/2013   Procedure: INSERTION OF MESH;  Surgeon: Jamesetta So, MD;  Location: [facility];  Service: General;  Laterality: Right;  . LOWER EXTREMITY  ANGIOGRAPHY Right 08/25/2018   Procedure: LOWER EXTREMITY ANGIOGRAPHY;  Surgeon: Wellington Hampshire, MD;  Location: [place] [facility];  Service: Cardiovascular;  Laterality: Right;  . LOWER EXTREMITY ANGIOGRAPHY N/A 11/25/2018   Procedure: LOWER EXTREMITY ANGIOGRAPHY;  Surgeon: Wellington Hampshire, MD;  Location: [place] [facility];  Service: Cardiovascular;  Laterality: N/A;  . NM MYOCAR PERF WALL MOTION  06/05/2010   no significant ischemia  . PERIPHERAL VASCULAR  ATHERECTOMY Right 08/03/2018   Procedure: PERIPHERAL VASCULAR ATHERECTOMY;  Surgeon: Lorretta Harp, MD;  Location: [place] [facility];  Service: Cardiovascular;  Laterality: Right;  . PERIPHERAL VASCULAR ATHERECTOMY Left 11/23/2018   Procedure: PERIPHERAL VASCULAR ATHERECTOMY;  Surgeon: Lorretta Harp, MD;  Location: [place] [facility];  Service: Cardiovascular;  Laterality: Left;  sfa with dc balloon  . PERIPHERAL VASCULAR BALLOON ANGIOPLASTY Left 11/25/2018   Procedure: PERIPHERAL VASCULAR BALLOON ANGIOPLASTY;  Surgeon: Wellington Hampshire, MD;  Location: [place] [facility];  Service: Cardiovascular;  Laterality: Left;  popliteal  . PERIPHERAL VASCULAR INTERVENTION Left 11/25/2018   Procedure: PERIPHERAL VASCULAR INTERVENTION;  Surgeon: Wellington Hampshire, MD;  Location: [place] [facility];  Service: Cardiovascular;  Laterality: Left;  tibial peroneal trunk and peroneal stents   . stent  06/14/2010   left leg  . STUMP REVISION Left 01/20/2019   Procedure: REVISION LEFT BELOW KNEE AMPUTATION;  Surgeon: [name], MD;  Location: [place];  Service: Orthopedics;  Laterality: Left;  . US ECHOCARDIOGRAPHY  01/21/2006   moderate mitral annular ca+, mild MR, AOV moderately sclerotic.    Current Outpatient Medications  Medication Sig Dispense Refill  . acetaminophen (TYLENOL) 500 MG tablet Take 500 mg by mouth every 6 (six) hours as needed for headache (pain).    [name] amLODipine (NORVASC) 2.5 MG tablet Take 5 mg by mouth every evening.     [name] apixaban (ELIQUIS) 5 MG TABS tablet Take 2.5 mg by mouth 2 (two) times daily.     . Ascorbic Acid (VITAMIN C) 1000 MG tablet Take 1,000 mg by mouth daily.     [name] atorvastatin (LIPITOR) 40 MG tablet TAKE 1 TABLET BY MOUTH EVERY DAY (Patient taking differently: Take 40 mg by mouth every evening. ) 90 tablet 1  . carvedilol (COREG) 6.25 MG tablet Take 1 tablet (6.25 mg total) by mouth 2 (two) times daily with a meal. 60 tablet 0  . Cholecalciferol (VITAMIN D-3) 125  MCG (5000 UT) TABS Take 5,000 Units by mouth every evening.    . clopidogrel (PLAVIX) 75 MG tablet Take 1 tablet (75 mg total) by mouth daily with breakfast.    . dorzolamide-timolol (COSOPT) 22.3-6.8 MG/ML ophthalmic solution Place 1 drop into both eyes 2 (two) times daily.     . famotidine (PEPCID) 20 MG tablet Take 1 tablet (20 mg total) by mouth daily. 60 tablet 0  . ferrous sulfate 325 (65 FE) MG tablet Take 325 mg by mouth 2 (two) times daily with a meal.    . finasteride (PROSCAR) 5 MG tablet Take 1 tablet (5 mg total) by mouth every evening. 30 tablet 0  . furosemide (LASIX) 40 MG tablet Take 20-40 mg by mouth See admin instructions. Take one tablet (40 mg) by mouth every morning and 1/2 tablet (20 mg) every evening    . glipiZIDE (GLUCOTROL) 5 MG tablet Take 5 mg by mouth 2 (two) times daily.     [name] latanoprost (XALATAN) 0.005 % ophthalmic solution Place 1 drop into both  eyes at bedtime.    [name] levothyroxine (SYNTHROID) 75 MCG tablet Take 75 mcg by mouth daily before breakfast.     . Misc Natural Products (GLUCOSAMINE CHOND MSM FORMULA PO) Take 1 tablet by mouth 2 (two) times daily.    Glory Rosebush ULTRA test strip 1 each 2 (two) times daily.    . polyethylene glycol (MIRALAX) 17 g packet Take 17 g by mouth daily as needed for moderate constipation. 14 each 0  . Potassium Gluconate 550 (90 K) MG TABS Take 90 mg by mouth 2 (two) times daily.    [name] pyridoxine (B-6) 100 MG tablet Take 100 mg by mouth daily.    . traMADol (ULTRAM) 50 MG tablet Take 1 tablet (50 mg total) by mouth every 6 (six) hours as needed. (Patient taking differently: Take 50 mg by mouth every 6 (six) hours as needed (pain). ) 60 tablet 0  . vitamin E 400 UNIT capsule Take 400 Units by mouth daily.     No current facility-administered medications for this visit.    Allergies:   Iodinated diagnostic agents and Dye fdc red [red dye]    Social History:  The patient  reports that he quit smoking about 54 years ago. He has quit  using smokeless tobacco.  His smokeless tobacco use included chew. He reports that he does not drink alcohol and does not use drugs.   Family History:  The patient's family history includes Cancer in his mother; Heart attack in his brother, brother, and father; Stroke in his father.  He indicated that his mother is deceased. He indicated that his father is deceased. He indicated that both of his sisters are alive. He indicated that six of his eight brothers are alive. He indicated that his maternal grandmother is deceased. He indicated that his maternal grandfather is deceased. He indicated that his paternal grandmother is deceased. He indicated that his paternal grandfather is deceased.  ROS:  Please see the history of present illness. All other systems are reviewed and negative.    PHYSICAL EXAM: VS:  BP (!) 181/76   Pulse (!) 51   Ht 5\' 8"  (1.727 m)   Wt 215 lb 12.8 oz (97.9 kg)   SpO2 98%   BMI 32.81 kg/m  , BMI Body mass index is 32.81 kg/m. GEN: Well nourished, well developed, male in no acute distress HEENT: normal for age  Neck: no JVD, no carotid bruit, no masses Cardiac: RRR; 2-3/6 murmur, no rubs, or gallops Respiratory:  clear to auscultation bilaterally but slightly decreased BS bases, normal work of breathing GI: soft, nontender, nondistended, + BS MS: no new deformity or atrophy; no UE edema; radial pulses are 2+, s/p bilat BKA Skin: warm and dry, no rash Neuro:  Strength and sensation are intact Psych: euthymic mood, full affect   EKG:  EKG is not ordered today  ECHO: 09/04/19 1. Left ventricular ejection fraction, by estimation, is 55 to 60%. The  left ventricle has normal function. The left ventricle has no regional  wall motion abnormalities. There is moderate left ventricular hypertrophy.  Left ventricular diastolic  parameters are consistent with Grade I diastolic dysfunction (impaired  relaxation). Elevated left atrial pressure.  2. Right ventricular  systolic function is normal. The right ventricular  size is normal.  3. The mitral valve is normal in structure. No evidence of mitral valve  regurgitation. No evidence of mitral stenosis.  4. The aortic valve has an indeterminant number of cusps. Aortic valve  regurgitation is not visualized. Moderate to severe aortic valve  stenosis.Aortic valve mean gradient measures 27.4 mmHg. Aortic valve peak  gradient measures 44.6 mmHg. Aortic valve  area, by VTI measures 0.77 cm.   CATH: none  Recent Labs: 12/09/2018: B Natriuretic Peptide 249.1 10/04/2019: ALT 17; Magnesium 2.5 10/05/2019: TSH 2.567 10/10/2019: Hemoglobin 10.9; Platelets 182 10/11/2019: BUN 61; Creatinine, Ser 2.91; Potassium 4.1; Sodium 130  CBC    Component Value Date/Time   WBC 11.1 (H) 10/10/2019 0401   RBC 3.35 (L) 10/10/2019 0401   HGB 10.9 (L) 10/10/2019 0401   HGB 10.4 (L) 07/23/2018 1249   HCT 32.1 (L) 10/10/2019 0401   HCT 30.4 (L) 07/23/2018 1249   PLT 182 10/10/2019 0401   PLT 255 07/23/2018 1249   MCV 95.8 10/10/2019 0401   MCV 95 07/23/2018 1249   MCH 32.5 10/10/2019 0401   MCHC 34.0 10/10/2019 0401   RDW 14.3 10/10/2019 0401   RDW 12.3 07/23/2018 1249   LYMPHSABS 3.0 02/04/2019 0620   MONOABS 0.6 02/04/2019 0620   EOSABS 0.5 02/04/2019 0620   BASOSABS 0.1 02/04/2019 0620   CMP Latest Ref Rng & Units 10/11/2019 10/10/2019 10/09/2019  Glucose 70 - 99 mg/dL 194(H) 191(H) 198(H)  BUN 8 - 23 mg/dL 61(H) 56(H) 50(H)  Creatinine 0.61 - 1.24 mg/dL 2.91(H) 2.86(H) 3.00(H)  Sodium 135 - 145 mmol/L 130(L) 130(L) 132(L)  Potassium 3.5 - 5.1 mmol/L 4.1 3.7 3.7  Chloride 98 - 111 mmol/L 93(L) 96(L) 94(L)  CO2 22 - 32 mmol/L 21(L) 21(L) 26  Calcium 8.9 - 10.3 mg/dL 8.4(L) 8.4(L) 8.7(L)  Total Protein 6.5 - 8.1 g/dL - - -  Total Bilirubin 0.3 - 1.2 mg/dL - - -  Alkaline Phos 38 - 126 U/L - - -  AST 15 - 41 U/L - - -  ALT 0 - 44 U/L - - -     Lipid Panel Lab Results  Component Value Date   CHOL 80  02/03/2019   HDL 26 (L) 02/03/2019   LDLCALC 37 02/03/2019   TRIG 85 02/03/2019   CHOLHDL 3.1 02/03/2019      Wt Readings from Last 3 Encounters:  10/25/19 215 lb 12.8 oz (97.9 kg)  10/11/19 217 lb 6 oz (98.6 kg)  10/04/19 221 lb 11.2 oz (100.6 kg)     Other studies Reviewed: Additional studies/ records that were reviewed today include: Office notes, hospital records and testing.  ASSESSMENT AND PLAN:  1.  NSTEMI - med rx due to poor renal function - on amlodipine 2.5 mg qd >> increase to 5 mg qd - continue BB, but decrease Coreg to 3.125 mg bid due to bradycardia - continue Plavix, ASA d/c'd when Eliquis started  2. PAF - HR is low but regular - will decrease BB due to bradycardia, follow BP/HR and sx - continue Eliquis at reduced dose  3. HTN:  - BP elevated by manual and automatic cuff - no sig difference between devices, BP elevated on both - will increase amlodipine for better BP control  4. CKD IV - Cr elevated on admit, improved some prior to d/c - not on ACE/ARB/spiro - ck BMET today   Current medicines are reviewed at length with the patient today.  The patient does not have concerns regarding medicines.  The following changes have been made:  Decrease Coreg, increase amlodipine  Labs/ tests ordered today include:  No orders of the defined types were placed in this encounter.    Disposition:  FU with Pixie Casino, MD  Signed, Rosaria Ferries, PA-C  10/25/2019 3:16 PM    [place] Phone: [phone]; Fax: [phone]

## 2019-10-25 ENCOUNTER — Other Ambulatory Visit: Payer: Self-pay

## 2019-10-25 ENCOUNTER — Encounter: Payer: Self-pay | Admitting: Physician Assistant

## 2019-10-25 ENCOUNTER — Encounter: Payer: Medicare Other | Admitting: Physical Therapy

## 2019-10-25 ENCOUNTER — Ambulatory Visit (INDEPENDENT_AMBULATORY_CARE_PROVIDER_SITE_OTHER): Payer: Medicare Other | Admitting: Physician Assistant

## 2019-10-25 VITALS — BP 168/74 | HR 51 | Ht 68.0 in | Wt 215.8 lb

## 2019-10-25 DIAGNOSIS — I10 Essential (primary) hypertension: Secondary | ICD-10-CM

## 2019-10-25 DIAGNOSIS — N184 Chronic kidney disease, stage 4 (severe): Secondary | ICD-10-CM | POA: Diagnosis not present

## 2019-10-25 DIAGNOSIS — I214 Non-ST elevation (NSTEMI) myocardial infarction: Secondary | ICD-10-CM | POA: Diagnosis not present

## 2019-10-25 DIAGNOSIS — I480 Paroxysmal atrial fibrillation: Secondary | ICD-10-CM | POA: Diagnosis not present

## 2019-10-25 MED ORDER — CARVEDILOL 3.125 MG PO TABS: 3.1250 mg | ORAL_TABLET | Freq: Two times a day (BID) | ORAL | 3 refills | Status: DC

## 2019-10-25 MED ORDER — AMLODIPINE BESYLATE 5 MG PO TABS: 5.0000 mg | ORAL_TABLET | Freq: Every day | ORAL | 3 refills | Status: DC

## 2019-10-25 MED ORDER — ATORVASTATIN CALCIUM 40 MG PO TABS
40.0000 mg | ORAL_TABLET | Freq: Every evening | ORAL | 3 refills | Status: DC
Start: 2019-10-25 — End: 2020-07-19

## 2019-10-25 NOTE — Patient Instructions (Addendum)
Medication Instructions:  Start Amlodipine 5mg  (1 Tablet Daily) Carvedilol 3.125 mg (1 Tablet Twice Daily) *If you need a refill on your cardiac medications before your next appointment, please call your pharmacy*   Lab Work: BMP If you have labs (blood work) drawn today and your tests are completely normal, you will receive your results only by: [name] MyChart Message (if you have MyChart) OR . A paper copy in the mail If you have any lab test that is abnormal or we need to change your treatment, we will call you to review the results.   Testing/Procedures: No testing   Follow-Up: At [facility], you and your health needs are our priority.  As part of our continuing mission to provide you with exceptional heart care, we have created designated Provider Care Teams.  These Care Teams include your primary Cardiologist (physician) and Advanced Practice Providers (APPs -  Physician Assistants and Nurse Practitioners) who all work together to provide you with the care you need, when you need it.     Your next appointment:   3 month(s)  The format for your next appointment:   In Person  Provider:   K. Mali Hilty, MD   Other Instructions

## 2019-10-26 ENCOUNTER — Other Ambulatory Visit: Payer: Self-pay | Admitting: *Deleted

## 2019-10-26 DIAGNOSIS — E785 Hyperlipidemia, unspecified: Secondary | ICD-10-CM | POA: Diagnosis not present

## 2019-10-26 DIAGNOSIS — I5032 Chronic diastolic (congestive) heart failure: Secondary | ICD-10-CM | POA: Diagnosis not present

## 2019-10-26 DIAGNOSIS — D631 Anemia in chronic kidney disease: Secondary | ICD-10-CM | POA: Diagnosis not present

## 2019-10-26 DIAGNOSIS — I130 Hypertensive heart and chronic kidney disease with heart failure and stage 1 through stage 4 chronic kidney disease, or unspecified chronic kidney disease: Secondary | ICD-10-CM | POA: Diagnosis not present

## 2019-10-26 DIAGNOSIS — E1151 Type 2 diabetes mellitus with diabetic peripheral angiopathy without gangrene: Secondary | ICD-10-CM | POA: Diagnosis not present

## 2019-10-26 DIAGNOSIS — I482 Chronic atrial fibrillation, unspecified: Secondary | ICD-10-CM | POA: Diagnosis not present

## 2019-10-26 DIAGNOSIS — N183 Chronic kidney disease, stage 3 unspecified: Secondary | ICD-10-CM | POA: Diagnosis not present

## 2019-10-26 DIAGNOSIS — I214 Non-ST elevation (NSTEMI) myocardial infarction: Secondary | ICD-10-CM | POA: Diagnosis not present

## 2019-10-26 DIAGNOSIS — E1122 Type 2 diabetes mellitus with diabetic chronic kidney disease: Secondary | ICD-10-CM | POA: Diagnosis not present

## 2019-10-26 DIAGNOSIS — Z4781 Encounter for orthopedic aftercare following surgical amputation: Secondary | ICD-10-CM | POA: Diagnosis not present

## 2019-10-26 DIAGNOSIS — I350 Nonrheumatic aortic (valve) stenosis: Secondary | ICD-10-CM | POA: Diagnosis not present

## 2019-10-26 DIAGNOSIS — E1165 Type 2 diabetes mellitus with hyperglycemia: Secondary | ICD-10-CM | POA: Diagnosis not present

## 2019-10-26 DIAGNOSIS — M1990 Unspecified osteoarthritis, unspecified site: Secondary | ICD-10-CM | POA: Diagnosis not present

## 2019-10-26 LAB — BASIC METABOLIC PANEL
BUN/Creatinine Ratio: 17 (ref 10–24)
BUN: 41 mg/dL — ABNORMAL HIGH (ref 10–36)
CO2: 28 mmol/L (ref 20–29)
Calcium: 9.3 mg/dL (ref 8.6–10.2)
Chloride: 94 mmol/L — ABNORMAL LOW (ref 96–106)
Creatinine, Ser: 2.48 mg/dL — ABNORMAL HIGH (ref 0.76–1.27)
GFR calc Af Amer: 25 mL/min/{1.73_m2} — ABNORMAL LOW (ref >59)
GFR calc non Af Amer: 22 mL/min/{1.73_m2} — ABNORMAL LOW (ref >59)
Glucose: 151 mg/dL — ABNORMAL HIGH (ref 65–99)
Potassium: 3.5 mmol/L (ref 3.5–5.2)
Sodium: 137 mmol/L (ref 134–144)

## 2019-10-26 NOTE — Patient Outreach (Signed)
[name]) Care Management  10/26/2019  [name] 1927-08-01 [id number]   Call placed to member's daughter to follow up on ongoing hospital recovery, no answer.  HIPAA compliant voice message left.  Will follow up within the next 3-4 business days.  [name], [place], MSN [place] [phone]

## 2019-10-27 ENCOUNTER — Encounter: Payer: Medicare Other | Admitting: Physical Therapy

## 2019-10-27 DIAGNOSIS — D631 Anemia in chronic kidney disease: Secondary | ICD-10-CM | POA: Diagnosis not present

## 2019-10-27 DIAGNOSIS — E1165 Type 2 diabetes mellitus with hyperglycemia: Secondary | ICD-10-CM | POA: Diagnosis not present

## 2019-10-27 DIAGNOSIS — I5032 Chronic diastolic (congestive) heart failure: Secondary | ICD-10-CM | POA: Diagnosis not present

## 2019-10-27 DIAGNOSIS — E1122 Type 2 diabetes mellitus with diabetic chronic kidney disease: Secondary | ICD-10-CM | POA: Diagnosis not present

## 2019-10-27 DIAGNOSIS — M1990 Unspecified osteoarthritis, unspecified site: Secondary | ICD-10-CM | POA: Diagnosis not present

## 2019-10-27 DIAGNOSIS — I214 Non-ST elevation (NSTEMI) myocardial infarction: Secondary | ICD-10-CM | POA: Diagnosis not present

## 2019-10-27 DIAGNOSIS — I130 Hypertensive heart and chronic kidney disease with heart failure and stage 1 through stage 4 chronic kidney disease, or unspecified chronic kidney disease: Secondary | ICD-10-CM | POA: Diagnosis not present

## 2019-10-27 DIAGNOSIS — N183 Chronic kidney disease, stage 3 unspecified: Secondary | ICD-10-CM | POA: Diagnosis not present

## 2019-10-27 DIAGNOSIS — Z4781 Encounter for orthopedic aftercare following surgical amputation: Secondary | ICD-10-CM | POA: Diagnosis not present

## 2019-10-27 DIAGNOSIS — E1151 Type 2 diabetes mellitus with diabetic peripheral angiopathy without gangrene: Secondary | ICD-10-CM | POA: Diagnosis not present

## 2019-10-27 DIAGNOSIS — E785 Hyperlipidemia, unspecified: Secondary | ICD-10-CM | POA: Diagnosis not present

## 2019-10-27 DIAGNOSIS — I350 Nonrheumatic aortic (valve) stenosis: Secondary | ICD-10-CM | POA: Diagnosis not present

## 2019-10-27 DIAGNOSIS — I482 Chronic atrial fibrillation, unspecified: Secondary | ICD-10-CM | POA: Diagnosis not present

## 2019-10-28 ENCOUNTER — Telehealth: Payer: Self-pay | Admitting: Physician Assistant

## 2019-10-28 DIAGNOSIS — R109 Unspecified abdominal pain: Secondary | ICD-10-CM | POA: Diagnosis not present

## 2019-10-28 DIAGNOSIS — E1165 Type 2 diabetes mellitus with hyperglycemia: Secondary | ICD-10-CM | POA: Diagnosis not present

## 2019-10-28 DIAGNOSIS — I214 Non-ST elevation (NSTEMI) myocardial infarction: Secondary | ICD-10-CM | POA: Diagnosis not present

## 2019-10-28 DIAGNOSIS — I482 Chronic atrial fibrillation, unspecified: Secondary | ICD-10-CM | POA: Diagnosis not present

## 2019-10-28 DIAGNOSIS — E1151 Type 2 diabetes mellitus with diabetic peripheral angiopathy without gangrene: Secondary | ICD-10-CM | POA: Diagnosis not present

## 2019-10-28 DIAGNOSIS — E785 Hyperlipidemia, unspecified: Secondary | ICD-10-CM | POA: Diagnosis not present

## 2019-10-28 DIAGNOSIS — I5032 Chronic diastolic (congestive) heart failure: Secondary | ICD-10-CM | POA: Diagnosis not present

## 2019-10-28 DIAGNOSIS — I350 Nonrheumatic aortic (valve) stenosis: Secondary | ICD-10-CM | POA: Diagnosis not present

## 2019-10-28 DIAGNOSIS — N183 Chronic kidney disease, stage 3 unspecified: Secondary | ICD-10-CM | POA: Diagnosis not present

## 2019-10-28 DIAGNOSIS — E1122 Type 2 diabetes mellitus with diabetic chronic kidney disease: Secondary | ICD-10-CM | POA: Diagnosis not present

## 2019-10-28 DIAGNOSIS — R3914 Feeling of incomplete bladder emptying: Secondary | ICD-10-CM | POA: Diagnosis not present

## 2019-10-28 DIAGNOSIS — I130 Hypertensive heart and chronic kidney disease with heart failure and stage 1 through stage 4 chronic kidney disease, or unspecified chronic kidney disease: Secondary | ICD-10-CM | POA: Diagnosis not present

## 2019-10-28 DIAGNOSIS — Z4781 Encounter for orthopedic aftercare following surgical amputation: Secondary | ICD-10-CM | POA: Diagnosis not present

## 2019-10-28 DIAGNOSIS — D631 Anemia in chronic kidney disease: Secondary | ICD-10-CM | POA: Diagnosis not present

## 2019-10-28 DIAGNOSIS — M1990 Unspecified osteoarthritis, unspecified site: Secondary | ICD-10-CM | POA: Diagnosis not present

## 2019-10-28 NOTE — Telephone Encounter (Signed)
Left a message for the patient to call back.  

## 2019-10-28 NOTE — Telephone Encounter (Signed)
Pt's son returning call. °

## 2019-10-28 NOTE — Telephone Encounter (Signed)
    Pt c/o medication issue:  1. Name of Medication:   amLODipine (NORVASC) 5 MG tablet    2. How are you currently taking this medication (dosage and times per day)? Take 1 tablet (5 mg total) by mouth daily.  3. Are you having a reaction (difficulty breathing--STAT)?   4. What is your medication issue? Pt's son said during pt's visit with Suanne Marker she change some of his medication, pt's amlodipine he is taking it before 2.5 mg twice a day and now it says take 5 mg once a day.  He said nothing hs changed and need clarification

## 2019-10-29 DIAGNOSIS — E785 Hyperlipidemia, unspecified: Secondary | ICD-10-CM | POA: Diagnosis not present

## 2019-10-29 DIAGNOSIS — R339 Retention of urine, unspecified: Secondary | ICD-10-CM | POA: Diagnosis not present

## 2019-10-29 DIAGNOSIS — Z4781 Encounter for orthopedic aftercare following surgical amputation: Secondary | ICD-10-CM | POA: Diagnosis not present

## 2019-10-29 DIAGNOSIS — D631 Anemia in chronic kidney disease: Secondary | ICD-10-CM | POA: Diagnosis not present

## 2019-10-29 DIAGNOSIS — I5032 Chronic diastolic (congestive) heart failure: Secondary | ICD-10-CM | POA: Diagnosis not present

## 2019-10-29 DIAGNOSIS — I130 Hypertensive heart and chronic kidney disease with heart failure and stage 1 through stage 4 chronic kidney disease, or unspecified chronic kidney disease: Secondary | ICD-10-CM | POA: Diagnosis not present

## 2019-10-29 DIAGNOSIS — E1165 Type 2 diabetes mellitus with hyperglycemia: Secondary | ICD-10-CM | POA: Diagnosis not present

## 2019-10-29 DIAGNOSIS — E1151 Type 2 diabetes mellitus with diabetic peripheral angiopathy without gangrene: Secondary | ICD-10-CM | POA: Diagnosis not present

## 2019-10-29 DIAGNOSIS — Z87440 Personal history of urinary (tract) infections: Secondary | ICD-10-CM | POA: Diagnosis not present

## 2019-10-29 DIAGNOSIS — I350 Nonrheumatic aortic (valve) stenosis: Secondary | ICD-10-CM | POA: Diagnosis not present

## 2019-10-29 DIAGNOSIS — E1122 Type 2 diabetes mellitus with diabetic chronic kidney disease: Secondary | ICD-10-CM | POA: Diagnosis not present

## 2019-10-29 DIAGNOSIS — L89312 Pressure ulcer of right buttock, stage 2: Secondary | ICD-10-CM | POA: Diagnosis not present

## 2019-10-29 DIAGNOSIS — I482 Chronic atrial fibrillation, unspecified: Secondary | ICD-10-CM | POA: Diagnosis not present

## 2019-10-29 DIAGNOSIS — I214 Non-ST elevation (NSTEMI) myocardial infarction: Secondary | ICD-10-CM | POA: Diagnosis not present

## 2019-10-29 DIAGNOSIS — N183 Chronic kidney disease, stage 3 unspecified: Secondary | ICD-10-CM | POA: Diagnosis not present

## 2019-10-29 DIAGNOSIS — M1990 Unspecified osteoarthritis, unspecified site: Secondary | ICD-10-CM | POA: Diagnosis not present

## 2019-10-29 NOTE — Telephone Encounter (Signed)
° ° °  Pt's son returning call, he said if he did not able to answer call her sister [name] at [phone]

## 2019-10-29 NOTE — Telephone Encounter (Signed)
Left message for daughter, we had his amlodipine as 2.5 mg once daily and rhonda did not realize he was actually taking it twice daily. Will forward to rhonda to see if further adjustments in medications is needed. We will call back once we hear from rhonda.

## 2019-11-01 ENCOUNTER — Encounter: Payer: Medicare Other | Admitting: Physical Therapy

## 2019-11-01 ENCOUNTER — Other Ambulatory Visit: Payer: Self-pay | Admitting: *Deleted

## 2019-11-01 DIAGNOSIS — I5032 Chronic diastolic (congestive) heart failure: Secondary | ICD-10-CM | POA: Diagnosis not present

## 2019-11-01 DIAGNOSIS — I350 Nonrheumatic aortic (valve) stenosis: Secondary | ICD-10-CM | POA: Diagnosis not present

## 2019-11-01 DIAGNOSIS — N183 Chronic kidney disease, stage 3 unspecified: Secondary | ICD-10-CM | POA: Diagnosis not present

## 2019-11-01 DIAGNOSIS — E1165 Type 2 diabetes mellitus with hyperglycemia: Secondary | ICD-10-CM | POA: Diagnosis not present

## 2019-11-01 DIAGNOSIS — M1990 Unspecified osteoarthritis, unspecified site: Secondary | ICD-10-CM | POA: Diagnosis not present

## 2019-11-01 DIAGNOSIS — E785 Hyperlipidemia, unspecified: Secondary | ICD-10-CM | POA: Diagnosis not present

## 2019-11-01 DIAGNOSIS — E1151 Type 2 diabetes mellitus with diabetic peripheral angiopathy without gangrene: Secondary | ICD-10-CM | POA: Diagnosis not present

## 2019-11-01 DIAGNOSIS — D631 Anemia in chronic kidney disease: Secondary | ICD-10-CM | POA: Diagnosis not present

## 2019-11-01 DIAGNOSIS — I214 Non-ST elevation (NSTEMI) myocardial infarction: Secondary | ICD-10-CM | POA: Diagnosis not present

## 2019-11-01 DIAGNOSIS — I482 Chronic atrial fibrillation, unspecified: Secondary | ICD-10-CM | POA: Diagnosis not present

## 2019-11-01 DIAGNOSIS — I130 Hypertensive heart and chronic kidney disease with heart failure and stage 1 through stage 4 chronic kidney disease, or unspecified chronic kidney disease: Secondary | ICD-10-CM | POA: Diagnosis not present

## 2019-11-01 DIAGNOSIS — Z4781 Encounter for orthopedic aftercare following surgical amputation: Secondary | ICD-10-CM | POA: Diagnosis not present

## 2019-11-01 DIAGNOSIS — E1122 Type 2 diabetes mellitus with diabetic chronic kidney disease: Secondary | ICD-10-CM | POA: Diagnosis not present

## 2019-11-01 NOTE — Telephone Encounter (Signed)
Attempted to call patient back on "best number provided" in phone note. Received "call can not be completed as dialed" message. Awaiting recommendations from [place], [place]

## 2019-11-01 NOTE — Telephone Encounter (Signed)
[name] called in returning call to [name] number [phone]

## 2019-11-01 NOTE — Patient Outreach (Signed)
[facility]) Care Management  11/01/2019  [name] 1927/03/18 [id number]   Call placed to member's daughter to follow up on management chronic conditions and recovery from MI.  She report he hasn't had any chest pain or shortness of breath.  Still has foley catheter in place, was changed by home health nurse last week however due to complications had to go to urology office to have it replaced (was having bloody urine and pain).  No further discomfort, urine was tested for UTI, awaiting results.  He has improving mobility with therapy as well.  Blood sugars remain slightly elevated, today was 236, have decreased back to the high 100's over the last week (187 Saturday).  Daughter denies any urgent concerns, agree to follow up within the next month.  Goals Addressed            This Visit's Progress   . THN - Monitor and Manage My Blood Sugar   On track    Follow Up Date 11/4   - check blood sugar at prescribed times - check blood sugar if I feel it is too high or too low - take the blood sugar log to all doctor visits    Why is this important?   Checking your blood sugar at home helps to keep it from getting very high or very low.  Writing the results in a diary or log helps the doctor know how to care for you.  Your blood sugar log should have the time, date and the results.  Also, write down the amount of insulin or other medicine that you take.  Other information, like what you ate, exercise done and how you were feeling, will also be helpful.     Notes:     . THN - Set My Target A1C   On track    Follow Up Date 12/4    - set target A1C (7)    Why is this important?   Your target A1C is decided together by you and your doctor.  It is based on several things like your age and other health issues.    Notes:     . THN -Improve My Heart Health   On track    Follow Up Date 11/4   - be open to making changes - if I have chest pain, call for help     Why is this important?   Lifestyle changes are key to improving the blood flow to your heart. Think about the things you can change and set a goal to live healthy.  Remember, when the blood vessels to your heart start to get clogged you may not have any symptoms.  Over time, they can get worse.  Don't ignore the signs, like chest pain, and get help right away.     Notes:       [name], RN, MSN [place] [phone]

## 2019-11-03 ENCOUNTER — Encounter: Payer: Medicare Other | Admitting: Physical Therapy

## 2019-11-03 DIAGNOSIS — Z87440 Personal history of urinary (tract) infections: Secondary | ICD-10-CM | POA: Diagnosis not present

## 2019-11-03 DIAGNOSIS — L89312 Pressure ulcer of right buttock, stage 2: Secondary | ICD-10-CM | POA: Diagnosis not present

## 2019-11-03 DIAGNOSIS — R339 Retention of urine, unspecified: Secondary | ICD-10-CM | POA: Diagnosis not present

## 2019-11-04 DIAGNOSIS — E1151 Type 2 diabetes mellitus with diabetic peripheral angiopathy without gangrene: Secondary | ICD-10-CM | POA: Diagnosis not present

## 2019-11-04 DIAGNOSIS — I214 Non-ST elevation (NSTEMI) myocardial infarction: Secondary | ICD-10-CM | POA: Diagnosis not present

## 2019-11-04 DIAGNOSIS — Z4781 Encounter for orthopedic aftercare following surgical amputation: Secondary | ICD-10-CM | POA: Diagnosis not present

## 2019-11-04 DIAGNOSIS — I5032 Chronic diastolic (congestive) heart failure: Secondary | ICD-10-CM | POA: Diagnosis not present

## 2019-11-04 DIAGNOSIS — E1122 Type 2 diabetes mellitus with diabetic chronic kidney disease: Secondary | ICD-10-CM | POA: Diagnosis not present

## 2019-11-04 DIAGNOSIS — D631 Anemia in chronic kidney disease: Secondary | ICD-10-CM | POA: Diagnosis not present

## 2019-11-04 DIAGNOSIS — I350 Nonrheumatic aortic (valve) stenosis: Secondary | ICD-10-CM | POA: Diagnosis not present

## 2019-11-04 DIAGNOSIS — N183 Chronic kidney disease, stage 3 unspecified: Secondary | ICD-10-CM | POA: Diagnosis not present

## 2019-11-04 DIAGNOSIS — E785 Hyperlipidemia, unspecified: Secondary | ICD-10-CM | POA: Diagnosis not present

## 2019-11-04 DIAGNOSIS — E1165 Type 2 diabetes mellitus with hyperglycemia: Secondary | ICD-10-CM | POA: Diagnosis not present

## 2019-11-04 DIAGNOSIS — I482 Chronic atrial fibrillation, unspecified: Secondary | ICD-10-CM | POA: Diagnosis not present

## 2019-11-04 DIAGNOSIS — I130 Hypertensive heart and chronic kidney disease with heart failure and stage 1 through stage 4 chronic kidney disease, or unspecified chronic kidney disease: Secondary | ICD-10-CM | POA: Diagnosis not present

## 2019-11-04 DIAGNOSIS — M1990 Unspecified osteoarthritis, unspecified site: Secondary | ICD-10-CM | POA: Diagnosis not present

## 2019-11-05 DIAGNOSIS — Z87440 Personal history of urinary (tract) infections: Secondary | ICD-10-CM | POA: Diagnosis not present

## 2019-11-05 DIAGNOSIS — R339 Retention of urine, unspecified: Secondary | ICD-10-CM | POA: Diagnosis not present

## 2019-11-08 ENCOUNTER — Encounter: Payer: Medicare Other | Admitting: Physical Therapy

## 2019-11-08 NOTE — Telephone Encounter (Signed)
No complaints on recent [facility] call, continue current rx. Amend med list.  Thanks

## 2019-11-09 DIAGNOSIS — N183 Chronic kidney disease, stage 3 unspecified: Secondary | ICD-10-CM | POA: Diagnosis not present

## 2019-11-09 DIAGNOSIS — E1122 Type 2 diabetes mellitus with diabetic chronic kidney disease: Secondary | ICD-10-CM | POA: Diagnosis not present

## 2019-11-09 DIAGNOSIS — I130 Hypertensive heart and chronic kidney disease with heart failure and stage 1 through stage 4 chronic kidney disease, or unspecified chronic kidney disease: Secondary | ICD-10-CM | POA: Diagnosis not present

## 2019-11-09 DIAGNOSIS — I350 Nonrheumatic aortic (valve) stenosis: Secondary | ICD-10-CM | POA: Diagnosis not present

## 2019-11-09 DIAGNOSIS — I5032 Chronic diastolic (congestive) heart failure: Secondary | ICD-10-CM | POA: Diagnosis not present

## 2019-11-09 DIAGNOSIS — Z4781 Encounter for orthopedic aftercare following surgical amputation: Secondary | ICD-10-CM | POA: Diagnosis not present

## 2019-11-09 DIAGNOSIS — E785 Hyperlipidemia, unspecified: Secondary | ICD-10-CM | POA: Diagnosis not present

## 2019-11-09 DIAGNOSIS — M1990 Unspecified osteoarthritis, unspecified site: Secondary | ICD-10-CM | POA: Diagnosis not present

## 2019-11-09 DIAGNOSIS — D631 Anemia in chronic kidney disease: Secondary | ICD-10-CM | POA: Diagnosis not present

## 2019-11-09 DIAGNOSIS — I214 Non-ST elevation (NSTEMI) myocardial infarction: Secondary | ICD-10-CM | POA: Diagnosis not present

## 2019-11-09 DIAGNOSIS — E1151 Type 2 diabetes mellitus with diabetic peripheral angiopathy without gangrene: Secondary | ICD-10-CM | POA: Diagnosis not present

## 2019-11-09 DIAGNOSIS — I482 Chronic atrial fibrillation, unspecified: Secondary | ICD-10-CM | POA: Diagnosis not present

## 2019-11-09 DIAGNOSIS — E1165 Type 2 diabetes mellitus with hyperglycemia: Secondary | ICD-10-CM | POA: Diagnosis not present

## 2019-11-10 ENCOUNTER — Encounter: Payer: Medicare Other | Admitting: Physical Therapy

## 2019-11-12 DIAGNOSIS — E1165 Type 2 diabetes mellitus with hyperglycemia: Secondary | ICD-10-CM | POA: Diagnosis not present

## 2019-11-12 DIAGNOSIS — E1151 Type 2 diabetes mellitus with diabetic peripheral angiopathy without gangrene: Secondary | ICD-10-CM | POA: Diagnosis not present

## 2019-11-12 DIAGNOSIS — I482 Chronic atrial fibrillation, unspecified: Secondary | ICD-10-CM | POA: Diagnosis not present

## 2019-11-12 DIAGNOSIS — N183 Chronic kidney disease, stage 3 unspecified: Secondary | ICD-10-CM | POA: Diagnosis not present

## 2019-11-12 DIAGNOSIS — I130 Hypertensive heart and chronic kidney disease with heart failure and stage 1 through stage 4 chronic kidney disease, or unspecified chronic kidney disease: Secondary | ICD-10-CM | POA: Diagnosis not present

## 2019-11-12 DIAGNOSIS — I5032 Chronic diastolic (congestive) heart failure: Secondary | ICD-10-CM | POA: Diagnosis not present

## 2019-11-12 DIAGNOSIS — E1122 Type 2 diabetes mellitus with diabetic chronic kidney disease: Secondary | ICD-10-CM | POA: Diagnosis not present

## 2019-11-12 DIAGNOSIS — M1990 Unspecified osteoarthritis, unspecified site: Secondary | ICD-10-CM | POA: Diagnosis not present

## 2019-11-12 DIAGNOSIS — E785 Hyperlipidemia, unspecified: Secondary | ICD-10-CM | POA: Diagnosis not present

## 2019-11-12 DIAGNOSIS — Z4781 Encounter for orthopedic aftercare following surgical amputation: Secondary | ICD-10-CM | POA: Diagnosis not present

## 2019-11-12 DIAGNOSIS — I214 Non-ST elevation (NSTEMI) myocardial infarction: Secondary | ICD-10-CM | POA: Diagnosis not present

## 2019-11-12 DIAGNOSIS — D631 Anemia in chronic kidney disease: Secondary | ICD-10-CM | POA: Diagnosis not present

## 2019-11-12 DIAGNOSIS — I350 Nonrheumatic aortic (valve) stenosis: Secondary | ICD-10-CM | POA: Diagnosis not present

## 2019-11-13 DIAGNOSIS — I4891 Unspecified atrial fibrillation: Secondary | ICD-10-CM | POA: Diagnosis not present

## 2019-11-13 DIAGNOSIS — I129 Hypertensive chronic kidney disease with stage 1 through stage 4 chronic kidney disease, or unspecified chronic kidney disease: Secondary | ICD-10-CM | POA: Diagnosis not present

## 2019-11-13 DIAGNOSIS — E7849 Other hyperlipidemia: Secondary | ICD-10-CM | POA: Diagnosis not present

## 2019-11-13 DIAGNOSIS — E1122 Type 2 diabetes mellitus with diabetic chronic kidney disease: Secondary | ICD-10-CM | POA: Diagnosis not present

## 2019-11-15 ENCOUNTER — Encounter: Payer: Medicare Other | Admitting: Physical Therapy

## 2019-11-16 ENCOUNTER — Telehealth: Payer: Self-pay | Admitting: Internal Medicine

## 2019-11-16 MED ORDER — AMLODIPINE BESYLATE 10 MG PO TABS: 10.0000 mg | ORAL_TABLET | Freq: Every day | ORAL | 3 refills | Status: DC

## 2019-11-16 NOTE — Telephone Encounter (Signed)
Spoke with son and relayed med change per MD and advised to contact Dr. Sharol Given for referral  Rx(s) sent to pharmacy electronically.

## 2019-11-16 NOTE — Telephone Encounter (Signed)
Agree -recommend Dr. Sharol Given for PT order. Given elevated BP - ok to increase amlodipine to 10 mg daily.  Dr Lemmie Evens

## 2019-11-16 NOTE — Telephone Encounter (Signed)
Returned call to patient's son [name]. He reports patient had been taking amlodipine 2.5mg  BID prior to 10/11 visit with [facility] PA's note indicates that patient was on amlodipine 2.5mg  QD so dose was increased to 5mg  QD which patient was already on  It appears that patient's son called in after this visit to relay the med discrepanacy but no further changes were recommended Son reports that patient's home BP is160-170/85  Patient's home PT will expire week of Thanksgiving Patient's son would like him in [place] therapy, where he had been going prior to MI in Sept Dr. Sharol Given had originally referred patient for PT  Son is asking if Dr. Debara Pickett can refer him for PT. Advised that he contact Dr. Sharol Given and if Dr. Debara Pickett needs to comment on cardiac status/stability for therapy then they can reach out  Will defer to [facility] and Dr. Debara Pickett concerning amlodipine dose/BP

## 2019-11-16 NOTE — Telephone Encounter (Signed)
Pt c/o medication issue:  1. Name of Medication: amLODipine (NORVASC) 5 MG tablet  2. How are you currently taking this medication (dosage and times per day)? As directed  3. Are you having a reaction (difficulty breathing--STAT)? no  4. What is your medication issue? Patient's son, [name], calling in because Rosaria Ferries said she was going to increase this medication on 10/11 at his father's appt. But, he states that it was never increased. He states his father went from take 0.5 tablet 2x daily for a total of 5mg  to 1 tablet daily for a total of 5mg .   Also, he would like his father to start back at [facility] the week after Thanksgiving and states he was told he would need a referral. Can Dr. Debara Pickett do this?

## 2019-11-17 ENCOUNTER — Encounter: Payer: Medicare Other | Admitting: Physical Therapy

## 2019-11-19 DIAGNOSIS — I350 Nonrheumatic aortic (valve) stenosis: Secondary | ICD-10-CM | POA: Diagnosis not present

## 2019-11-19 DIAGNOSIS — I214 Non-ST elevation (NSTEMI) myocardial infarction: Secondary | ICD-10-CM | POA: Diagnosis not present

## 2019-11-19 DIAGNOSIS — E1165 Type 2 diabetes mellitus with hyperglycemia: Secondary | ICD-10-CM | POA: Diagnosis not present

## 2019-11-19 DIAGNOSIS — I482 Chronic atrial fibrillation, unspecified: Secondary | ICD-10-CM | POA: Diagnosis not present

## 2019-11-19 DIAGNOSIS — E1151 Type 2 diabetes mellitus with diabetic peripheral angiopathy without gangrene: Secondary | ICD-10-CM | POA: Diagnosis not present

## 2019-11-19 DIAGNOSIS — D631 Anemia in chronic kidney disease: Secondary | ICD-10-CM | POA: Diagnosis not present

## 2019-11-19 DIAGNOSIS — N183 Chronic kidney disease, stage 3 unspecified: Secondary | ICD-10-CM | POA: Diagnosis not present

## 2019-11-19 DIAGNOSIS — E785 Hyperlipidemia, unspecified: Secondary | ICD-10-CM | POA: Diagnosis not present

## 2019-11-19 DIAGNOSIS — I5032 Chronic diastolic (congestive) heart failure: Secondary | ICD-10-CM | POA: Diagnosis not present

## 2019-11-19 DIAGNOSIS — Z4781 Encounter for orthopedic aftercare following surgical amputation: Secondary | ICD-10-CM | POA: Diagnosis not present

## 2019-11-19 DIAGNOSIS — E1122 Type 2 diabetes mellitus with diabetic chronic kidney disease: Secondary | ICD-10-CM | POA: Diagnosis not present

## 2019-11-19 DIAGNOSIS — M1990 Unspecified osteoarthritis, unspecified site: Secondary | ICD-10-CM | POA: Diagnosis not present

## 2019-11-19 DIAGNOSIS — I130 Hypertensive heart and chronic kidney disease with heart failure and stage 1 through stage 4 chronic kidney disease, or unspecified chronic kidney disease: Secondary | ICD-10-CM | POA: Diagnosis not present

## 2019-11-22 ENCOUNTER — Encounter: Payer: Medicare Other | Admitting: Physical Therapy

## 2019-11-23 ENCOUNTER — Ambulatory Visit (INDEPENDENT_AMBULATORY_CARE_PROVIDER_SITE_OTHER): Payer: Medicare Other | Admitting: Physician Assistant

## 2019-11-23 ENCOUNTER — Encounter: Payer: Self-pay | Admitting: Orthopedic Surgery

## 2019-11-23 VITALS — Ht 68.0 in | Wt 215.0 lb

## 2019-11-23 DIAGNOSIS — Z89512 Acquired absence of left leg below knee: Secondary | ICD-10-CM | POA: Diagnosis not present

## 2019-11-23 DIAGNOSIS — Z89511 Acquired absence of right leg below knee: Secondary | ICD-10-CM

## 2019-11-23 NOTE — Progress Notes (Signed)
Office Visit Note   Patient: [name]           Date of Birth: February 06, 1927           MRN: [id number] Visit Date: 11/23/2019              Requested by: Redmond School, [address] [place],  [place] [address] PCP: [place] School, MD  Chief Complaint  Patient presents with  . Left Leg - Follow-up    01/20/19 revision left BKA       HPI: Patient is 10 months status post left below-knee amputation revision.  He also status post below-knee amputation on the right.  He had been progressing well in physical therapy outpatient.  Unfortunately 2 months ago he had a heart attack.  His son brings him in today.  He states that the cardiologist is fine with him going back to physical therapy and that he is actually been doing physical therapy at home.  He requires a note from [place] clearing him to return to physical therapy  Assessment & Plan: Visit Diagnoses: No diagnosis found.  Plan: Patient may return to physical therapy follow-up with [place] as needed  Follow-Up Instructions: No follow-ups on file.   Ortho Exam  Patient is alert, oriented, no adenopathy, well-dressed, normal affect, normal respiratory effort. Prosthetic is fitting well amputation stump is completely healed no cellulitis no wound dehiscence no swelling  Imaging: No results found. No images are attached to the encounter.  Labs: Lab Results  Component Value Date   HGBA1C 8.5 (H) 10/05/2019   HGBA1C 5.6 01/19/2019   HGBA1C 5.9 (H) 11/09/2018   ESRSEDRATE 45 (H) 01/18/2019   ESRSEDRATE 84 (H) 11/18/2018   CRP 6.2 (H) 01/18/2019   CRP 5.1 (H) 11/18/2018   CRP <0.8 05/29/2018   REPTSTATUS 10/10/2019 FINAL 10/09/2019   CULT MULTIPLE SPECIES PRESENT, SUGGEST RECOLLECTION (A) 10/09/2019   LABORGA ESCHERICHIA COLI (A) 09/28/2018     Lab Results  Component Value Date   ALBUMIN 3.2 (L) 10/04/2019   ALBUMIN 3.1 (L) 02/04/2019   ALBUMIN 3.4 (L) 02/02/2019   PREALBUMIN 11.6 (L) 01/18/2019    Lab Results    Component Value Date   MG 2.5 (H) 10/04/2019   MG 2.1 02/04/2019   MG 2.1 11/20/2018   No results found for: VD25OH  Lab Results  Component Value Date   PREALBUMIN 11.6 (L) 01/18/2019   CBC EXTENDED Latest Ref Rng & Units 10/10/2019 10/09/2019 10/08/2019  WBC 4.0 - 10.5 K/uL 11.1(H) 12.2(H) 14.0(H)  RBC 4.22 - 5.81 MIL/uL 3.35(L) 3.29(L) 3.55(L)  HGB 13.0 - 17.0 g/dL 10.9(L) 10.5(L) 11.2(L)  HCT 39 - 52 % 32.1(L) 31.0(L) 33.6(L)  PLT 150 - 400 K/uL 182 165 177  NEUTROABS 1.7 - 7.7 K/uL - - -  LYMPHSABS 0.7 - 4.0 K/uL - - -     Body mass index is 32.69 kg/m.  Orders:  No orders of the defined types were placed in this encounter.  No orders of the defined types were placed in this encounter.    Procedures: No procedures performed  Clinical Data: No additional findings.  ROS:  All other systems negative, except as noted in the HPI. Review of Systems  Objective: Vital Signs: Ht 5\' 8"  (1.727 m)   Wt 215 lb (97.5 kg)   BMI 32.69 kg/m   Specialty Comments:  No specialty comments available.  PMFS History: Patient Active Problem List   Diagnosis Date Noted  . AKI (acute  kidney injury) ([place])   . Aortic valve stenosis   . Palliative care encounter   . NSTEMI (non-ST elevated myocardial infarction) ([place]) 10/04/2019  . Acute kidney injury superimposed on CKD ([place]) 10/04/2019  . Hypokalemia 10/04/2019  . Weakness generalized 04/05/2019  . S/P BKA (below knee amputation) unilateral, left ([place]) 02/15/2019  . Pressure injury of skin 02/03/2019  . TIA (transient ischemic attack) 02/03/2019  . Transient speech disturbance 02/02/2019  . Chronic indwelling Foley catheter 01/22/2019  . Postoperative wound infection 01/20/2019  . Normocytic anemia 01/18/2019  . Cellulitis 01/18/2019  . Infection of deep incisional surgical site after procedure   . Abnormal urinalysis   . Labile blood glucose   . Urinary retention   . S/P bilateral BKA (below knee amputation) ([place])    . Acute blood loss anemia   . Urinary retention due to benign prostatic hyperplasia 12/28/2018  . Unilateral complete BKA, left, initial encounter ([name]) 12/25/2018  . Dehiscence of amputation stump ([place])   . History of transmetatarsal amputation of left foot ([place]) 12/09/2018  . Critical limb ischemia with history of revascularization of same extremity ([place])   . Gangrene of left foot ([place])   . Diabetic ulcer of toe of left foot associated with type 2 diabetes mellitus ([place])   . Cellulitis of left lower extremity   . Toe infection 11/18/2018  . Peripheral edema   . Acute on chronic diastolic CHF (congestive heart failure) ([place]) 11/09/2018  . S/P BKA (below knee amputation) unilateral, right ([place]) 10/05/2018  . Hyponatremia   . Essential hypertension   . Postoperative pain   . Pain of left heel   . Unable to maintain weight-bearing   . Type 2 diabetes mellitus with diabetic peripheral angiopathy with gangrene ([place])   . Anemia due to acute blood loss   . Chronic kidney disease, stage 3a   . Acquired absence of right leg below knee ([place]) 09/07/2018  . Gangrene of right foot ([place])   . DNR (do not resuscitate) discussion   . Goals of care, counseling/discussion   . Palliative care by specialist   . Type 2 diabetes mellitus with vascular disease ([place])   . Severe protein-calorie malnutrition ([place])   . Ischemic foot 08/20/2018  . Critical lower limb ischemia ([place]) 08/03/2018  . Type 2 diabetes mellitus with foot ulcer, without long-term current use of insulin ([place])   . Gastroesophageal reflux disease   . Cellulitis of great toe of right foot 05/29/2018  . Atrial fibrillation, chronic   . Chest pain 07/25/2017  . PAF (paroxysmal atrial fibrillation) ([place]) 07/25/2017  . BPH (benign prostatic hyperplasia) 07/25/2017  . Shortness of breath 05/11/2017  . Hypothyroidism 05/11/2017  . Chronic diastolic CHF (congestive heart failure) ([place]) 05/11/2017  . Moderate aortic stenosis 06/12/2016  .  RBBB 06/12/2016  . Peripheral vascular disease ([place]) 08/04/2012  . Essential hypertension, benign 08/04/2012  . Hyperlipidemia 08/04/2012  . Dizziness 08/04/2012   Past Medical History:  Diagnosis Date  . Arthritis   . Borderline diabetic   . CHF (congestive heart failure) ([place])   . Chronic kidney disease   . Diabetes mellitus without complication ([place])   . Dysrhythmia   . Glaucoma   . Hyperlipidemia   . Hypertension   . Hypothyroidism   . PVD (peripheral vascular disease) ([place])   . Sleep apnea    Cpap ordered but doesnt use  . Urinary retention due to benign prostatic hyperplasia 12/28/2018    Family History  Problem Relation  Age of Onset  . Cancer Mother   . Heart attack Father   . Stroke Father   . Heart attack Brother   . Heart attack Brother     Past Surgical History:  Procedure Laterality Date  . ABDOMINAL AORTOGRAM W/LOWER EXTREMITY N/A 07/27/2018   Procedure: ABDOMINAL AORTOGRAM W/LOWER EXTREMITY;  Surgeon: Lorretta Harp, MD;  Location: [facility];  Service: Cardiovascular;  Laterality: N/A;  . AMPUTATION Right 08/28/2018   Procedure: RIGHT BELOW KNEE AMPUTATION;  Surgeon: [name], MD;  Location: [facility];  Service: Orthopedics;  Laterality: Right;  . AMPUTATION Left 12/02/2018   Procedure: LEFT TRANSMETATARSAL AMPUTATION AND NEGATIVE PRESSURE WOUND VAC PLACEMENT;  Surgeon: [name], MD;  Location: [facility];  Service: Orthopedics;  Laterality: Left;  . AMPUTATION Left 12/18/2018   Procedure: LEFT BELOW KNEE AMPUTATION;  Surgeon: [name], MD;  Location: [facility];  Service: Orthopedics;  Laterality: Left;  . APPENDECTOMY  1943  . BELOW KNEE LEG AMPUTATION Left 12/18/2018  . CATARACT EXTRACTION  2012   x2  . COLONOSCOPY N/A 11/01/2014   Procedure: COLONOSCOPY;  Surgeon: Aviva Signs, MD;  [facility];  Service: Gastroenterology;  Laterality: N/A;  . ESOPHAGOGASTRODUODENOSCOPY N/A 11/01/2014   Procedure: ESOPHAGOGASTRODUODENOSCOPY  (EGD);  Surgeon: Aviva Signs, MD;  [facility];  Service: Gastroenterology;  Laterality: N/A;  . HERNIA REPAIR Left   . INGUINAL HERNIA REPAIR Right 03/01/2013   Procedure: RIGHT INGUINAL HERNIORRHAPHY;  Surgeon: Jamesetta So, MD;  Location: [facility];  Service: General;  Laterality: Right;  . INSERTION OF MESH Right 03/01/2013   Procedure: INSERTION OF MESH;  Surgeon: Jamesetta So, MD;  [facility];  Service: General;  Laterality: Right;  . LOWER EXTREMITY ANGIOGRAPHY Right 08/25/2018   Procedure: LOWER EXTREMITY ANGIOGRAPHY;  Surgeon: Wellington Hampshire, MD;  Location: [facility];  Service: Cardiovascular;  Laterality: Right;  . LOWER EXTREMITY ANGIOGRAPHY N/A 11/25/2018   Procedure: LOWER EXTREMITY ANGIOGRAPHY;  Surgeon: Wellington Hampshire, MD;  Location: [facility];  Service: Cardiovascular;  Laterality: N/A;  . NM MYOCAR PERF WALL MOTION  06/05/2010   no significant ischemia  . PERIPHERAL VASCULAR ATHERECTOMY Right 08/03/2018   Procedure: PERIPHERAL VASCULAR ATHERECTOMY;  Surgeon: Lorretta Harp, MD;  Location: [facility];  Service: Cardiovascular;  Laterality: Right;  . PERIPHERAL VASCULAR ATHERECTOMY Left 11/23/2018   Procedure: PERIPHERAL VASCULAR ATHERECTOMY;  Surgeon: Lorretta Harp, MD;  Location: [facility];  Service: Cardiovascular;  Laterality: Left;  sfa with dc balloon  . PERIPHERAL VASCULAR BALLOON ANGIOPLASTY Left 11/25/2018   Procedure: PERIPHERAL VASCULAR BALLOON ANGIOPLASTY;  Surgeon: Wellington Hampshire, MD;  Location: [facility];  Service: Cardiovascular;  Laterality: Left;  popliteal  . PERIPHERAL VASCULAR INTERVENTION Left 11/25/2018   Procedure: PERIPHERAL VASCULAR INTERVENTION;  Surgeon: Wellington Hampshire, MD;  Location: [facility];  Service: Cardiovascular;  Laterality: Left;  tibial peroneal trunk and peroneal stents   . stent  06/14/2010   left leg  . STUMP REVISION Left 01/20/2019   Procedure: REVISION  LEFT BELOW KNEE AMPUTATION;  Surgeon: [name], MD;  Location: [facility];  Service: Orthopedics;  Laterality: Left;  . US ECHOCARDIOGRAPHY  01/21/2006   moderate mitral annular ca+, mild MR, AOV moderately sclerotic.   Social History   Occupational History  . Occupation: Retired Psychologist, sport and exercise  Tobacco Use  . Smoking status: Former Smoker  Quit date: 01/13/1965    Years since quitting: 54.8  . Smokeless tobacco: Former Systems developer    Types: Secondary school teacher  . Vaping Use: Never used  Substance and Sexual Activity  . Alcohol use: No  . Drug use: No  . Sexual activity: Not Currently

## 2019-11-24 ENCOUNTER — Encounter: Payer: Medicare Other | Admitting: Physical Therapy

## 2019-11-24 DIAGNOSIS — N183 Chronic kidney disease, stage 3 unspecified: Secondary | ICD-10-CM | POA: Diagnosis not present

## 2019-11-24 DIAGNOSIS — E1122 Type 2 diabetes mellitus with diabetic chronic kidney disease: Secondary | ICD-10-CM | POA: Diagnosis not present

## 2019-11-24 DIAGNOSIS — I214 Non-ST elevation (NSTEMI) myocardial infarction: Secondary | ICD-10-CM | POA: Diagnosis not present

## 2019-11-24 DIAGNOSIS — I5032 Chronic diastolic (congestive) heart failure: Secondary | ICD-10-CM | POA: Diagnosis not present

## 2019-11-24 DIAGNOSIS — E785 Hyperlipidemia, unspecified: Secondary | ICD-10-CM | POA: Diagnosis not present

## 2019-11-24 DIAGNOSIS — Z4781 Encounter for orthopedic aftercare following surgical amputation: Secondary | ICD-10-CM | POA: Diagnosis not present

## 2019-11-24 DIAGNOSIS — I350 Nonrheumatic aortic (valve) stenosis: Secondary | ICD-10-CM | POA: Diagnosis not present

## 2019-11-24 DIAGNOSIS — E1151 Type 2 diabetes mellitus with diabetic peripheral angiopathy without gangrene: Secondary | ICD-10-CM | POA: Diagnosis not present

## 2019-11-24 DIAGNOSIS — I482 Chronic atrial fibrillation, unspecified: Secondary | ICD-10-CM | POA: Diagnosis not present

## 2019-11-24 DIAGNOSIS — E1165 Type 2 diabetes mellitus with hyperglycemia: Secondary | ICD-10-CM | POA: Diagnosis not present

## 2019-11-24 DIAGNOSIS — I130 Hypertensive heart and chronic kidney disease with heart failure and stage 1 through stage 4 chronic kidney disease, or unspecified chronic kidney disease: Secondary | ICD-10-CM | POA: Diagnosis not present

## 2019-11-24 DIAGNOSIS — M1990 Unspecified osteoarthritis, unspecified site: Secondary | ICD-10-CM | POA: Diagnosis not present

## 2019-11-24 DIAGNOSIS — D631 Anemia in chronic kidney disease: Secondary | ICD-10-CM | POA: Diagnosis not present

## 2019-11-29 ENCOUNTER — Other Ambulatory Visit: Payer: Self-pay | Admitting: *Deleted

## 2019-11-29 ENCOUNTER — Encounter: Payer: Medicare Other | Admitting: Physical Therapy

## 2019-11-29 DIAGNOSIS — R3914 Feeling of incomplete bladder emptying: Secondary | ICD-10-CM | POA: Diagnosis not present

## 2019-11-29 NOTE — Patient Outreach (Signed)
[name]) Care Management  11/29/2019  MRK BUZBY 03/17/1927 [id number]   Call placed to member's daughter, no answer.  HPAA compliant voice message left, will follow up within the next 3-4 business days.  [name], [place], MSN [place] [phone]

## 2019-11-30 DIAGNOSIS — L309 Dermatitis, unspecified: Secondary | ICD-10-CM | POA: Diagnosis not present

## 2019-11-30 DIAGNOSIS — Z681 Body mass index (BMI) 19 or less, adult: Secondary | ICD-10-CM | POA: Diagnosis not present

## 2019-12-01 ENCOUNTER — Encounter: Payer: Medicare Other | Admitting: Physical Therapy

## 2019-12-02 ENCOUNTER — Other Ambulatory Visit: Payer: Self-pay | Admitting: *Deleted

## 2019-12-02 NOTE — Patient Outreach (Signed)
[facility]) Care Management  12/02/2019  [name] Sep 06, 1927 [id number]   Outreach attempt #2, successful to son [name].  Report member has been doing well however over the last several days has been experiencing itching on his back.  He was seen by PCP and given a steroid as well as hydroxyzine.  Per son, this has provided minimal relief, resolved more with ice packs.  This care manager inquired about other symptoms, denies burning or pain, positive for rash.  Member's daughter is in the process of scheduling appointment with dermatologist for further evaluation.    Son report compliance with increased dose of Amlodipine (10 mg daily), state blood pressure has decreased to 120s/60s.  He has cardiac clearance to restart PT.  Glipizide also increased to 10 mg daily due to elevated blood sugars.  Readings this week have been 258, 228, and 253, could be related to steroid use, hoping readings will decrease over the next few days.  They will continue to monitor readings and follow diabetic diet.  Denies any urgent concerns, encouraged to contact this care manager with questions.  Agrees to follow up within the next month.  Goals Addressed            This Visit's Progress   . [facility] - Monitor and Manage My Blood Sugar   On track    Follow Up Date 12/18   - check blood sugar at prescribed times - check blood sugar if I feel it is too high or too low - take the blood sugar log to all doctor visits    Why is this important?   Checking your blood sugar at home helps to keep it from getting very high or very low.  Writing the results in a diary or log helps the doctor know how to care for you.  Your blood sugar log should have the time, date and the results.  Also, write down the amount of insulin or other medicine that you take.  Other information, like what you ate, exercise done and how you were feeling, will also be helpful.     Notes:   11/18 - blood sugar readings  reviewed, discussed compliance with medication regime    . [facility] - Set My Target A1C   On track    Follow Up Date 02/01/2020   - set target A1C (7)    Why is this important?   Your target A1C is decided together by you and your doctor.  It is based on several things like your age and other health issues.    Notes:   11/18 - Discussed dose change for oral diabetic agents    . [facility] -Improve My Heart Health   On track    Follow Up Date 12/18   - be open to making changes - if I have chest pain, call for help    Why is this important?   Lifestyle changes are key to improving the blood flow to your heart. Think about the things you can change and set a goal to live healthy.  Remember, when the blood vessels to your heart start to get clogged you may not have any symptoms.  Over time, they can get worse.  Don't ignore the signs, like chest pain, and get help right away.     Notes:   11/18 - discussed antihypertensive dose change and BP trends       [name], Therapist, sports, MSN [place]  Manager [phone]

## 2019-12-03 ENCOUNTER — Telehealth: Payer: Self-pay | Admitting: Internal Medicine

## 2019-12-03 DIAGNOSIS — E1122 Type 2 diabetes mellitus with diabetic chronic kidney disease: Secondary | ICD-10-CM | POA: Diagnosis not present

## 2019-12-03 DIAGNOSIS — N183 Chronic kidney disease, stage 3 unspecified: Secondary | ICD-10-CM | POA: Diagnosis not present

## 2019-12-03 DIAGNOSIS — E1151 Type 2 diabetes mellitus with diabetic peripheral angiopathy without gangrene: Secondary | ICD-10-CM | POA: Diagnosis not present

## 2019-12-03 DIAGNOSIS — M1990 Unspecified osteoarthritis, unspecified site: Secondary | ICD-10-CM | POA: Diagnosis not present

## 2019-12-03 DIAGNOSIS — D631 Anemia in chronic kidney disease: Secondary | ICD-10-CM | POA: Diagnosis not present

## 2019-12-03 DIAGNOSIS — I214 Non-ST elevation (NSTEMI) myocardial infarction: Secondary | ICD-10-CM | POA: Diagnosis not present

## 2019-12-03 DIAGNOSIS — E785 Hyperlipidemia, unspecified: Secondary | ICD-10-CM | POA: Diagnosis not present

## 2019-12-03 DIAGNOSIS — I5032 Chronic diastolic (congestive) heart failure: Secondary | ICD-10-CM | POA: Diagnosis not present

## 2019-12-03 DIAGNOSIS — I482 Chronic atrial fibrillation, unspecified: Secondary | ICD-10-CM | POA: Diagnosis not present

## 2019-12-03 DIAGNOSIS — I350 Nonrheumatic aortic (valve) stenosis: Secondary | ICD-10-CM | POA: Diagnosis not present

## 2019-12-03 DIAGNOSIS — L239 Allergic contact dermatitis, unspecified cause: Secondary | ICD-10-CM | POA: Diagnosis not present

## 2019-12-03 DIAGNOSIS — Z4781 Encounter for orthopedic aftercare following surgical amputation: Secondary | ICD-10-CM | POA: Diagnosis not present

## 2019-12-03 DIAGNOSIS — E1165 Type 2 diabetes mellitus with hyperglycemia: Secondary | ICD-10-CM | POA: Diagnosis not present

## 2019-12-03 DIAGNOSIS — I130 Hypertensive heart and chronic kidney disease with heart failure and stage 1 through stage 4 chronic kidney disease, or unspecified chronic kidney disease: Secondary | ICD-10-CM | POA: Diagnosis not present

## 2019-12-03 MED ORDER — CLOPIDOGREL BISULFATE 75 MG PO TABS
75.0000 mg | ORAL_TABLET | Freq: Every day | ORAL | 3 refills | Status: DC
Start: 2019-12-03 — End: 2020-08-28

## 2019-12-03 NOTE — Telephone Encounter (Signed)
rx sent to pharmacy-son made aware

## 2019-12-03 NOTE — Telephone Encounter (Signed)
Pt c/o medication issue:  1. Name of Medication: clopidogrel (PLAVIX) 75 MG tablet  2. How are you currently taking this medication (dosage and times per day)? As written  3. Are you having a reaction (difficulty breathing--STAT)? No   4. What is your medication issue? Patient needs a new prescription sent in.

## 2019-12-05 ENCOUNTER — Emergency Department (HOSPITAL_COMMUNITY): Payer: Medicare Other

## 2019-12-05 ENCOUNTER — Emergency Department (HOSPITAL_COMMUNITY)
Admission: EM | Admit: 2019-12-05 | Discharge: 2019-12-05 | Disposition: A | Discharge status: Home / Self Care | Payer: Medicare Other | Source: Home / Self Care | Attending: Emergency Medicine | Admitting: Emergency Medicine

## 2019-12-05 ENCOUNTER — Encounter (HOSPITAL_COMMUNITY): Payer: Self-pay

## 2019-12-05 DIAGNOSIS — I130 Hypertensive heart and chronic kidney disease with heart failure and stage 1 through stage 4 chronic kidney disease, or unspecified chronic kidney disease: Secondary | ICD-10-CM | POA: Insufficient documentation

## 2019-12-05 DIAGNOSIS — N390 Urinary tract infection, site not specified: Secondary | ICD-10-CM | POA: Diagnosis not present

## 2019-12-05 DIAGNOSIS — R0789 Other chest pain: Secondary | ICD-10-CM | POA: Diagnosis not present

## 2019-12-05 DIAGNOSIS — E039 Hypothyroidism, unspecified: Secondary | ICD-10-CM | POA: Insufficient documentation

## 2019-12-05 DIAGNOSIS — Z79899 Other long term (current) drug therapy: Secondary | ICD-10-CM | POA: Insufficient documentation

## 2019-12-05 DIAGNOSIS — I10 Essential (primary) hypertension: Secondary | ICD-10-CM | POA: Diagnosis not present

## 2019-12-05 DIAGNOSIS — E1122 Type 2 diabetes mellitus with diabetic chronic kidney disease: Secondary | ICD-10-CM | POA: Insufficient documentation

## 2019-12-05 DIAGNOSIS — I350 Nonrheumatic aortic (valve) stenosis: Secondary | ICD-10-CM | POA: Diagnosis not present

## 2019-12-05 DIAGNOSIS — Z89512 Acquired absence of left leg below knee: Secondary | ICD-10-CM | POA: Diagnosis not present

## 2019-12-05 DIAGNOSIS — D6489 Other specified anemias: Secondary | ICD-10-CM | POA: Diagnosis not present

## 2019-12-05 DIAGNOSIS — Z978 Presence of other specified devices: Secondary | ICD-10-CM | POA: Diagnosis not present

## 2019-12-05 DIAGNOSIS — R6889 Other general symptoms and signs: Secondary | ICD-10-CM | POA: Diagnosis not present

## 2019-12-05 DIAGNOSIS — N1831 Chronic kidney disease, stage 3a: Secondary | ICD-10-CM | POA: Diagnosis not present

## 2019-12-05 DIAGNOSIS — Z515 Encounter for palliative care: Secondary | ICD-10-CM | POA: Diagnosis not present

## 2019-12-05 DIAGNOSIS — N184 Chronic kidney disease, stage 4 (severe): Secondary | ICD-10-CM | POA: Diagnosis not present

## 2019-12-05 DIAGNOSIS — Z7984 Long term (current) use of oral hypoglycemic drugs: Secondary | ICD-10-CM | POA: Insufficient documentation

## 2019-12-05 DIAGNOSIS — N289 Disorder of kidney and ureter, unspecified: Secondary | ICD-10-CM

## 2019-12-05 DIAGNOSIS — K5900 Constipation, unspecified: Secondary | ICD-10-CM | POA: Diagnosis not present

## 2019-12-05 DIAGNOSIS — G319 Degenerative disease of nervous system, unspecified: Secondary | ICD-10-CM | POA: Diagnosis not present

## 2019-12-05 DIAGNOSIS — R079 Chest pain, unspecified: Secondary | ICD-10-CM | POA: Insufficient documentation

## 2019-12-05 DIAGNOSIS — J810 Acute pulmonary edema: Secondary | ICD-10-CM | POA: Diagnosis not present

## 2019-12-05 DIAGNOSIS — R531 Weakness: Secondary | ICD-10-CM | POA: Diagnosis not present

## 2019-12-05 DIAGNOSIS — E785 Hyperlipidemia, unspecified: Secondary | ICD-10-CM | POA: Diagnosis not present

## 2019-12-05 DIAGNOSIS — G4733 Obstructive sleep apnea (adult) (pediatric): Secondary | ICD-10-CM | POA: Diagnosis not present

## 2019-12-05 DIAGNOSIS — Z87891 Personal history of nicotine dependence: Secondary | ICD-10-CM | POA: Insufficient documentation

## 2019-12-05 DIAGNOSIS — N401 Enlarged prostate with lower urinary tract symptoms: Secondary | ICD-10-CM | POA: Diagnosis not present

## 2019-12-05 DIAGNOSIS — J189 Pneumonia, unspecified organism: Secondary | ICD-10-CM | POA: Diagnosis present

## 2019-12-05 DIAGNOSIS — L89311 Pressure ulcer of right buttock, stage 1: Secondary | ICD-10-CM | POA: Diagnosis not present

## 2019-12-05 DIAGNOSIS — N189 Chronic kidney disease, unspecified: Secondary | ICD-10-CM | POA: Insufficient documentation

## 2019-12-05 DIAGNOSIS — I5033 Acute on chronic diastolic (congestive) heart failure: Secondary | ICD-10-CM | POA: Insufficient documentation

## 2019-12-05 DIAGNOSIS — J9811 Atelectasis: Secondary | ICD-10-CM | POA: Diagnosis not present

## 2019-12-05 DIAGNOSIS — R338 Other retention of urine: Secondary | ICD-10-CM | POA: Diagnosis not present

## 2019-12-05 DIAGNOSIS — E1159 Type 2 diabetes mellitus with other circulatory complications: Secondary | ICD-10-CM | POA: Diagnosis not present

## 2019-12-05 DIAGNOSIS — B965 Pseudomonas (aeruginosa) (mallei) (pseudomallei) as the cause of diseases classified elsewhere: Secondary | ICD-10-CM | POA: Diagnosis not present

## 2019-12-05 DIAGNOSIS — I2510 Atherosclerotic heart disease of native coronary artery without angina pectoris: Secondary | ICD-10-CM | POA: Diagnosis not present

## 2019-12-05 DIAGNOSIS — Z20822 Contact with and (suspected) exposure to covid-19: Secondary | ICD-10-CM | POA: Diagnosis present

## 2019-12-05 DIAGNOSIS — I480 Paroxysmal atrial fibrillation: Secondary | ICD-10-CM | POA: Diagnosis not present

## 2019-12-05 DIAGNOSIS — G9341 Metabolic encephalopathy: Secondary | ICD-10-CM | POA: Diagnosis not present

## 2019-12-05 DIAGNOSIS — E1151 Type 2 diabetes mellitus with diabetic peripheral angiopathy without gangrene: Secondary | ICD-10-CM | POA: Diagnosis not present

## 2019-12-05 DIAGNOSIS — I482 Chronic atrial fibrillation, unspecified: Secondary | ICD-10-CM | POA: Diagnosis not present

## 2019-12-05 DIAGNOSIS — A419 Sepsis, unspecified organism: Secondary | ICD-10-CM | POA: Diagnosis not present

## 2019-12-05 DIAGNOSIS — Z7901 Long term (current) use of anticoagulants: Secondary | ICD-10-CM | POA: Insufficient documentation

## 2019-12-05 DIAGNOSIS — I5032 Chronic diastolic (congestive) heart failure: Secondary | ICD-10-CM | POA: Diagnosis not present

## 2019-12-05 DIAGNOSIS — Y846 Urinary catheterization as the cause of abnormal reaction of the patient, or of later complication, without mention of misadventure at the time of the procedure: Secondary | ICD-10-CM | POA: Diagnosis present

## 2019-12-05 DIAGNOSIS — Z7189 Other specified counseling: Secondary | ICD-10-CM | POA: Diagnosis not present

## 2019-12-05 DIAGNOSIS — J90 Pleural effusion, not elsewhere classified: Secondary | ICD-10-CM | POA: Diagnosis not present

## 2019-12-05 DIAGNOSIS — E871 Hypo-osmolality and hyponatremia: Secondary | ICD-10-CM | POA: Diagnosis not present

## 2019-12-05 DIAGNOSIS — L89322 Pressure ulcer of left buttock, stage 2: Secondary | ICD-10-CM | POA: Diagnosis not present

## 2019-12-05 DIAGNOSIS — I517 Cardiomegaly: Secondary | ICD-10-CM | POA: Diagnosis not present

## 2019-12-05 DIAGNOSIS — T83511A Infection and inflammatory reaction due to indwelling urethral catheter, initial encounter: Secondary | ICD-10-CM | POA: Diagnosis not present

## 2019-12-05 DIAGNOSIS — Z743 Need for continuous supervision: Secondary | ICD-10-CM | POA: Diagnosis not present

## 2019-12-05 HISTORY — DX: Acute myocardial infarction, unspecified: I21.9

## 2019-12-05 LAB — BASIC METABOLIC PANEL
Anion gap: 11 (ref 5–15)
BUN: 48 mg/dL — ABNORMAL HIGH (ref 8–23)
CO2: 27 mmol/L (ref 22–32)
Calcium: 8.9 mg/dL (ref 8.9–10.3)
Chloride: 92 mmol/L — ABNORMAL LOW (ref 98–111)
Creatinine, Ser: 2.84 mg/dL — ABNORMAL HIGH (ref 0.61–1.24)
GFR, Estimated: 20 mL/min — ABNORMAL LOW (ref >60)
Glucose, Bld: 443 mg/dL — ABNORMAL HIGH (ref 70–99)
Potassium: 3.8 mmol/L (ref 3.5–5.1)
Sodium: 130 mmol/L — ABNORMAL LOW (ref 135–145)

## 2019-12-05 LAB — TROPONIN I (HIGH SENSITIVITY)
Troponin I (High Sensitivity): 327 ng/L (ref <18)
Troponin I (High Sensitivity): 332 ng/L (ref <18)

## 2019-12-05 LAB — CBC
HCT: 36.7 % — ABNORMAL LOW (ref 39.0–52.0)
Hemoglobin: 12.5 g/dL — ABNORMAL LOW (ref 13.0–17.0)
MCH: 32.6 pg (ref 26.0–34.0)
MCHC: 34.1 g/dL (ref 30.0–36.0)
MCV: 95.8 fL (ref 80.0–100.0)
Platelets: 233 10*3/uL (ref 150–400)
RBC: 3.83 MIL/uL — ABNORMAL LOW (ref 4.22–5.81)
RDW: 14.4 % (ref 11.5–15.5)
WBC: 16.6 10*3/uL — ABNORMAL HIGH (ref 4.0–10.5)
nRBC: 0 % (ref 0.0–0.2)

## 2019-12-05 NOTE — ED Notes (Addendum)
Date and time results received: 12/05/19 12:50 PM  (use smartphrase ".now" to insert current time)  Test: troponin Critical Value: 327  Name of Provider Notified: Dr. Eulis Foster  Orders Received? Or Actions Taken?: N/a

## 2019-12-05 NOTE — ED Triage Notes (Signed)
EMS reports pt was at church and started having nonradiating substernal chest pain.  Son took pt home and gave him tums.  Tums didn't help so they called 911.  They gave him 324mg  asa.  Reports BBB on ekg.  Reports pt had an MI 2 weeks ago.  Reports pain free after 1 nitro.  Reports initially rated pain at 8 but rates at 0 at this time.

## 2019-12-05 NOTE — ED Notes (Signed)
Date and time results received: 12/05/19 2:41 PM  (use smartphrase ".now" to insert current time)  Test: Troponin Critical Value: 332  Name of Provider Notified: Dr. Eulis Foster  Orders Received? Or Actions Taken?:N/a

## 2019-12-05 NOTE — ED Provider Notes (Addendum)
[facility] EMERGENCY DEPARTMENT Provider Note   CSN: [id number] Arrival date & time: 12/05/19  1132     History Chief Complaint  Patient presents with  . Chest Pain    [name] is a 86 y.o. male.  HPI Patient presents for evaluation of chest discomfort.  This apparently occurred earlier today when he was at church.  He subsequently went home, where he was given an antacid and a nitroglycerin which helped his pain.  EMS was called and transferred him here and treated him with aspirin during transport.  He was hospitalized about 6 weeks ago at which time he had an NSTEMI, but did not have a cardiac catheterization.  At that time he had a palliative care consultation.  He was additionally worked up with an echocardiogram at that time which showed aortic stenosis but no other acute abnormalities.  He has bilateral below-knee amputations and walks with a prosthesis.  Patient's son arrived to give additional history.  I talked to him at 12:45 PM.  He states that the patient was started on a prednisone taper after prednisone injection, 4 days ago.  He saw a dermatologist, 2 days ago and was started on a "cream," to treat an itchy rash of his back.  The rash has resolved.  The patient is no longer itching.  Today, while at church patient had pain immediately when he sat down and left church after 15 minutes because he was having ongoing chest pain.  At home his son gave him Tums and 4 baby aspirin.  On some arrival he states that his father appears to be back to normal.    Past Medical History:  Diagnosis Date  . Arthritis   . Borderline diabetic   . CHF (congestive heart failure) ([place])   . Chronic kidney disease   . Diabetes mellitus without complication ([place])   . Dysrhythmia   . Glaucoma   . Hyperlipidemia   . Hypertension   . Hypothyroidism   . Myocardial infarction ([place])   . PVD (peripheral vascular disease) ([place])   . Sleep apnea    Cpap ordered but doesnt use  . Urinary  retention due to benign prostatic hyperplasia 12/28/2018    Patient Active Problem List   Diagnosis Date Noted  . AKI (acute kidney injury) ([place])   . Aortic valve stenosis   . Palliative care encounter   . NSTEMI (non-ST elevated myocardial infarction) ([place]) 10/04/2019  . Acute kidney injury superimposed on CKD ([place]) 10/04/2019  . Hypokalemia 10/04/2019  . Weakness generalized 04/05/2019  . S/P BKA (below knee amputation) unilateral, left ([place]) 02/15/2019  . Pressure injury of skin 02/03/2019  . TIA (transient ischemic attack) 02/03/2019  . Transient speech disturbance 02/02/2019  . Chronic indwelling Foley catheter 01/22/2019  . Postoperative wound infection 01/20/2019  . Normocytic anemia 01/18/2019  . Cellulitis 01/18/2019  . Infection of deep incisional surgical site after procedure   . Abnormal urinalysis   . Labile blood glucose   . Urinary retention   . S/P bilateral BKA (below knee amputation) ([place])   . Acute blood loss anemia   . Urinary retention due to benign prostatic hyperplasia 12/28/2018  . Unilateral complete BKA, left, initial encounter ([place]) 12/25/2018  . Dehiscence of amputation stump ([place])   . History of transmetatarsal amputation of left foot ([place]) 12/09/2018  . Critical limb ischemia with history of revascularization of same extremity ([place])   . Gangrene of left foot ([place])   . Diabetic ulcer  of toe of left foot associated with type 2 diabetes mellitus ([place])   . Cellulitis of left lower extremity   . Toe infection 11/18/2018  . Peripheral edema   . Acute on chronic diastolic CHF (congestive heart failure) ([place]) 11/09/2018  . S/P BKA (below knee amputation) unilateral, right ([place]) 10/05/2018  . Hyponatremia   . Essential hypertension   . Postoperative pain   . Pain of left heel   . Unable to maintain weight-bearing   . Type 2 diabetes mellitus with diabetic peripheral angiopathy with gangrene ([place])   . Anemia due to acute blood loss   . Chronic kidney  disease, stage 3a   . Acquired absence of right leg below knee ([place]) 09/07/2018  . Gangrene of right foot ([place])   . DNR (do not resuscitate) discussion   . Goals of care, counseling/discussion   . Palliative care by specialist   . Type 2 diabetes mellitus with vascular disease ([place])   . Severe protein-calorie malnutrition ([place])   . Ischemic foot 08/20/2018  . Critical lower limb ischemia ([place]) 08/03/2018  . Type 2 diabetes mellitus with foot ulcer, without long-term current use of insulin ([place])   . Gastroesophageal reflux disease   . Cellulitis of great toe of right foot 05/29/2018  . Atrial fibrillation, chronic   . Chest pain 07/25/2017  . PAF (paroxysmal atrial fibrillation) ([place]) 07/25/2017  . BPH (benign prostatic hyperplasia) 07/25/2017  . Shortness of breath 05/11/2017  . Hypothyroidism 05/11/2017  . Chronic diastolic CHF (congestive heart failure) ([place]) 05/11/2017  . Moderate aortic stenosis 06/12/2016  . RBBB 06/12/2016  . Peripheral vascular disease ([place]) 08/04/2012  . Essential hypertension, benign 08/04/2012  . Hyperlipidemia 08/04/2012  . Dizziness 08/04/2012    Past Surgical History:  Procedure Laterality Date  . ABDOMINAL AORTOGRAM W/LOWER EXTREMITY N/A 07/27/2018   Procedure: ABDOMINAL AORTOGRAM W/LOWER EXTREMITY;  Surgeon: Lorretta Harp, MD;  Location: [place] [facility];  Service: Cardiovascular;  Laterality: N/A;  . AMPUTATION Right 08/28/2018   Procedure: RIGHT BELOW KNEE AMPUTATION;  Surgeon: [name], MD;  Location: [place];  Service: Orthopedics;  Laterality: Right;  . AMPUTATION Left 12/02/2018   Procedure: LEFT TRANSMETATARSAL AMPUTATION AND NEGATIVE PRESSURE WOUND VAC PLACEMENT;  Surgeon: [name], MD;  Location: [place];  Service: Orthopedics;  Laterality: Left;  . AMPUTATION Left 12/18/2018   Procedure: LEFT BELOW KNEE AMPUTATION;  Surgeon: [name], MD;  Location: [place];  Service: Orthopedics;  Laterality: Left;  . APPENDECTOMY  1943   . BELOW KNEE LEG AMPUTATION Left 12/18/2018  . CATARACT EXTRACTION  2012   x2  . COLONOSCOPY N/A 11/01/2014   Procedure: COLONOSCOPY;  Surgeon: Aviva Signs, MD;  [facility];  Service: Gastroenterology;  Laterality: N/A;  . ESOPHAGOGASTRODUODENOSCOPY N/A 11/01/2014   Procedure: ESOPHAGOGASTRODUODENOSCOPY (EGD);  Surgeon: Aviva Signs, MD;  [facility]: [facility];  Service: Gastroenterology;  Laterality: N/A;  . HERNIA REPAIR Left   . INGUINAL HERNIA REPAIR Right 03/01/2013   Procedure: RIGHT INGUINAL HERNIORRHAPHY;  Surgeon: Jamesetta So, MD;  Location: [facility];  Service: General;  Laterality: Right;  . INSERTION OF MESH Right 03/01/2013   Procedure: INSERTION OF MESH;  Surgeon: Jamesetta So, MD;  Location: [facility];  Service: General;  Laterality: Right;  . LOWER EXTREMITY ANGIOGRAPHY Right 08/25/2018   Procedure: LOWER EXTREMITY ANGIOGRAPHY;  Surgeon: Wellington Hampshire, MD;  Location: [place] [facility];  Service: Cardiovascular;  Laterality: Right;  . LOWER  EXTREMITY ANGIOGRAPHY N/A 11/25/2018   Procedure: LOWER EXTREMITY ANGIOGRAPHY;  Surgeon: Wellington Hampshire, MD;  Location: [place] [facility];  Service: Cardiovascular;  Laterality: N/A;  . NM MYOCAR PERF WALL MOTION  06/05/2010   no significant ischemia  . PERIPHERAL VASCULAR ATHERECTOMY Right 08/03/2018   Procedure: PERIPHERAL VASCULAR ATHERECTOMY;  Surgeon: Lorretta Harp, MD;  Location: [place] [facility];  Service: Cardiovascular;  Laterality: Right;  . PERIPHERAL VASCULAR ATHERECTOMY Left 11/23/2018   Procedure: PERIPHERAL VASCULAR ATHERECTOMY;  Surgeon: Lorretta Harp, MD;  Location: [place] [facility];  Service: Cardiovascular;  Laterality: Left;  sfa with dc balloon  . PERIPHERAL VASCULAR BALLOON ANGIOPLASTY Left 11/25/2018   Procedure: PERIPHERAL VASCULAR BALLOON ANGIOPLASTY;  Surgeon: Wellington Hampshire, MD;  Location: [place] [facility];  Service: Cardiovascular;  Laterality: Left;  popliteal  . PERIPHERAL  VASCULAR INTERVENTION Left 11/25/2018   Procedure: PERIPHERAL VASCULAR INTERVENTION;  Surgeon: Wellington Hampshire, MD;  Location: [place] [facility];  Service: Cardiovascular;  Laterality: Left;  tibial peroneal trunk and peroneal stents   . stent  06/14/2010   left leg  . STUMP REVISION Left 01/20/2019   Procedure: REVISION LEFT BELOW KNEE AMPUTATION;  Surgeon: [name], MD;  Location: [place];  Service: Orthopedics;  Laterality: Left;  . US ECHOCARDIOGRAPHY  01/21/2006   moderate mitral annular ca+, mild MR, AOV moderately sclerotic.       Family History  Problem Relation Age of Onset  . Cancer Mother   . Heart attack Father   . Stroke Father   . Heart attack Brother   . Heart attack Brother     Social History   Tobacco Use  . Smoking status: Former Smoker    Quit date: 01/13/1965    Years since quitting: 54.9  . Smokeless tobacco: Former Systems developer    Types: Secondary school teacher  . Vaping Use: Never used  Substance Use Topics  . Alcohol use: No  . Drug use: No    Home Medications Prior to Admission medications   Medication Sig Start Date End Date Taking? Authorizing Provider  acetaminophen (TYLENOL) 500 MG tablet Take 500 mg by mouth every 6 (six) hours as needed for headache (pain).    [provider]  amLODipine (NORVASC) 10 MG tablet Take 1 tablet (10 mg total) by mouth daily. 11/16/19 02/14/20  Hilty, Nadean Corwin, MD  apixaban (ELIQUIS) 5 MG TABS tablet Take 2.5 mg by mouth 2 (two) times daily.     [provider]  Ascorbic Acid (VITAMIN C) 1000 MG tablet Take 1,000 mg by mouth daily.     [provider]  atorvastatin (LIPITOR) 40 MG tablet Take 1 tablet (40 mg total) by mouth every evening. 10/25/19   Barrett, Evelene Croon, PA-C  carvedilol (COREG) 3.125 MG tablet Take 1 tablet (3.125 mg total) by mouth 2 (two) times daily. 10/25/19 01/23/20  Barrett, Evelene Croon, PA-C  Cholecalciferol (VITAMIN D-3) 125 MCG (5000 UT) TABS Take 5,000 Units by mouth every  evening.    [provider]  clopidogrel (PLAVIX) 75 MG tablet Take 1 tablet (75 mg total) by mouth daily with breakfast. 12/03/19   Hilty, Nadean Corwin, MD  dorzolamide-timolol (COSOPT) 22.3-6.8 MG/ML ophthalmic solution Place 1 drop into both eyes 2 (two) times daily.     [provider]  famotidine (PEPCID) 20 MG tablet Take 1 tablet (20 mg total) by mouth daily. 11/12/18   Murlean Iba, MD  ferrous sulfate  325 (65 FE) MG tablet Take 325 mg by mouth 2 (two) times daily with a meal.    [provider]  finasteride (PROSCAR) 5 MG tablet Take 1 tablet (5 mg total) by mouth every evening. 09/22/18   Angiulli, Lavon Paganini, PA-C  furosemide (LASIX) 40 MG tablet Take 20-40 mg by mouth See admin instructions. Take one tablet (40 mg) by mouth every morning and 1/2 tablet (20 mg) every evening    [provider]  glipiZIDE (GLUCOTROL) 5 MG tablet Take 5 mg by mouth 2 (two) times daily.     [provider]  latanoprost (XALATAN) 0.005 % ophthalmic solution Place 1 drop into both eyes at bedtime. 09/30/19   [provider]  levothyroxine (SYNTHROID) 75 MCG tablet Take 75 mcg by mouth daily before breakfast.  02/12/19   [provider]  Misc Natural Products ([place] MSM FORMULA PO) Take 1 tablet by mouth 2 (two) times daily.    [provider]  [facility] ULTRA test strip 1 each 2 (two) times daily. 04/15/19   [provider]  polyethylene glycol (MIRALAX) 17 g packet Take 17 g by mouth daily as needed for moderate constipation. 12/09/18   Mercy Riding, MD  Potassium Gluconate 550 (90 K) MG TABS Take 90 mg by mouth 2 (two) times daily.    [provider]  pyridoxine (B-6) 100 MG tablet Take 100 mg by mouth daily.    [provider]  traMADol (ULTRAM) 50 MG tablet Take 1 tablet (50 mg total) by mouth every 6 (six) hours as needed. Patient taking differently: Take 50 mg by mouth every 6 (six) hours as needed  (pain).  02/15/19   Lovorn, Jinny Blossom, MD  vitamin E 400 UNIT capsule Take 400 Units by mouth daily.    [provider]    Allergies    Iodinated diagnostic agents and Dye fdc red [red dye]  Review of Systems   Review of Systems  All other systems reviewed and are negative.   Physical Exam Updated Vital Signs BP (!) 143/61   Pulse (!) 58   Temp 98 F (36.7 C) (Oral)   Resp 13   Ht 5\' 8"  (1.727 m)   Wt 97 kg   SpO2 96%   BMI 32.52 kg/m   Physical Exam Vitals and nursing note reviewed.  Constitutional:      General: He is not in acute distress.    Appearance: Normal appearance. He is well-developed. He is not ill-appearing, toxic-appearing or diaphoretic.  HENT:     Head: Normocephalic and atraumatic.     Right Ear: External ear normal.     Left Ear: External ear normal.  Eyes:     Conjunctiva/sclera: Conjunctivae normal.     Pupils: Pupils are equal, round, and reactive to light.  Neck:     Trachea: Phonation normal.  Cardiovascular:     Rate and Rhythm: Normal rate and regular rhythm.     Heart sounds: Normal heart sounds.  Pulmonary:     Effort: Pulmonary effort is normal.     Breath sounds: Normal breath sounds.  Abdominal:     General: There is no distension.     Palpations: Abdomen is soft.     Tenderness: There is no abdominal tenderness.  Musculoskeletal:        General: Normal range of motion.     Cervical back: Normal range of motion and neck supple.  Skin:    General: Skin is  warm and dry.     Comments: Red, raised papules of torso, primarily back No drainage, bleeding, vesicles or petechiae.  Neurological:     Mental Status: He is alert and oriented to person, place, and time.     Cranial Nerves: No cranial nerve deficit.     Sensory: No sensory deficit.     Motor: No abnormal muscle tone.     Coordination: Coordination normal.  Psychiatric:        Mood and Affect: Mood normal.        Behavior: Behavior normal.     ED Results /  Procedures / Treatments   Labs (all labs ordered are listed, but only abnormal results are displayed) Labs Reviewed  BASIC METABOLIC PANEL - Abnormal; Notable for the following components:      Result Value   Sodium 130 (*)    Chloride 92 (*)    Glucose, Bld 443 (*)    BUN 48 (*)    Creatinine, Ser 2.84 (*)    GFR, Estimated 20 (*)    All other components within normal limits  CBC - Abnormal; Notable for the following components:   WBC 16.6 (*)    RBC 3.83 (*)    Hemoglobin 12.5 (*)    HCT 36.7 (*)    All other components within normal limits  TROPONIN I (HIGH SENSITIVITY) - Abnormal; Notable for the following components:   Troponin I (High Sensitivity) 327 (*)    All other components within normal limits  TROPONIN I (HIGH SENSITIVITY) - Abnormal; Notable for the following components:   Troponin I (High Sensitivity) 332 (*)    All other components within normal limits    EKG EKG Interpretation  Date/Time:  Sunday December 05 2019 11:40:22 EST Ventricular Rate:  76 PR Interval:    QRS Duration: 157 QT Interval:  463 QTC Calculation: 521 R Axis:   80 Text Interpretation: Sinus rhythm Right bundle branch block Artifact since last tracing no significant change Confirmed by Daleen Bo [phone]) on 12/05/2019 11:50:21 AM   Radiology DG Chest Portable 1 View  Result Date: 12/05/2019 CLINICAL DATA:  Chest pain EXAM: PORTABLE CHEST 1 VIEW COMPARISON:  10/09/2019 FINDINGS: Chronic mild interstitial prominence. Patchy density at the left lung base. Stable cardiomediastinal contours. No pneumothorax. IMPRESSION: Patchy left basilar atelectasis/consolidation with possible small pleural effusion. Electronically Signed   By: Macy Mis M.D.   On: 12/05/2019 13:05    Procedures Procedures (including critical care time)  Medications Ordered in ED Medications - No data to display  ED Course  I have reviewed the triage vital signs and the nursing notes.  Pertinent labs &  imaging results that were available during my care of the patient were reviewed by me and considered in my medical decision making (see chart for details).  Clinical Course as of Dec 05 1511  Sun Dec 05, 2019  1245 Patient son is now in the room and states that the patient was started on a prednisone taper after a prednisone injection, 4 days ago.  He also saw a dermatologist 2 days ago and was given a cream, to treat the itchy rash on his back.  The rash is resolved.  He is no longer itching.  Today the son brought the patient home from church after about 15 minutes of sitting in the pew, because he was having chest pain.  The chest pain persisted, at home so son gave him to Tums.  During transport EMS gave him  a single nitroglycerin.  At this time the patient remains comfortable and has not had return of chest pain.   [EW]  1454 Unchanged delta troponin  Troponin I (High Sensitivity)(!!) [EW]  1455 Similar to prior except sodium lower than before.  Basic metabolic panel(!) [EW]  6144 Compared to prior, white count higher, hemoglobin higher  CBC(!) [EW]    Clinical Course User Index [EW] Daleen Bo, MD   MDM Rules/Calculators/A&P                           Patient Vitals for the past 24 hrs:  BP Temp Temp src Pulse Resp SpO2 Height Weight  12/05/19 1406 (!) 143/61 -- -- (!) 58 13 96 % -- --  12/05/19 1330 (!) 145/57 -- -- (!) 58 11 95 % -- --  12/05/19 1310 (!) 149/60 -- -- 66 12 95 % -- --  12/05/19 1230 (!) 161/71 -- -- 67 19 94 % -- --  12/05/19 1141 (!) 174/88 98 F (36.7 C) Oral 75 14 94 % -- --  12/05/19 1136 -- -- -- -- -- -- 5\' 8"  (1.727 m) 97 kg    3:13 PM Reevaluation with update and discussion. After initial assessment and treatment, an updated evaluation reveals he continues to be comfortable.  Findings discussed with patient and son, all questions were answered. Daleen Bo   Medical Decision Making:  This patient is presenting for evaluation of chest pain, which  does require a range of treatment options, and is a complaint that involves a high risk of morbidity and mortality. The differential diagnoses include ACS, PE, pneumonia. I decided to review old records, and in summary chest pain.  I obtained additional historical information from son at the bedside.  Clinical Laboratory Tests Ordered, included CBC, Metabolic panel and Delta troponin. Review indicates elevated but unchanged delta troponin, remainder of parameters are essentially at baseline but there may be mild dehydration evidenced by increasing hemoglobin and white count, and slightly higher BUN and creatinine.. Radiologic Tests Ordered, included chest x-ray.  I independently Visualized: Radiologic images, which show consistent with decreased pleural fluid/effusions  Cardiac Monitor Tracing which shows normal sinus rhythm    Critical Interventions-clinical evaluation, laboratory testing, chest x-ray, observation, repeat troponin, reassessment  After These Interventions, the Patient was reevaluated and was found stable for discharge.  Patient with ongoing renal insufficiency, possibly slightly worse.  Hemodynamically stable.  Suspect gastric irritation from prednisone, causing discomfort manifested as chest discomfort.  No indication for requirement of continuing prednisone.  Doubt ACS, PE or pneumonia.  I will asked the patient to hold Lasix, tonight and tomorrow morning, then restart tomorrow evening.  He will be asked to follow-up with his PCP by telephone or in person within the next 2 to 3 days.  CRITICAL CARE-no Performed by: Daleen Bo  Nursing Notes Reviewed/ Care Coordinated Applicable Imaging Reviewed Interpretation of Laboratory Data incorporated into ED treatment  The patient appears reasonably screened and/or stabilized for discharge and I doubt any other medical condition or other [facility] requiring further screening, evaluation, or treatment in the ED at this time prior to  discharge.  Plan: Home Medications-continue usual except hold next 2 doses of Lasix, and stop prednisone; Home Treatments-regular diet and activity; return here if the recommended treatment, does not improve the symptoms; Recommended follow up-PCP or cardiology follow-up within 2 to 3 days.     Final Clinical Impression(s) / ED Diagnoses Final diagnoses:  Nonspecific chest pain  Renal insufficiency    Rx / DC Orders ED Discharge Orders    None       Daleen Bo, MD 12/05/19 1514    Daleen Bo, MD 12/17/19 1000

## 2019-12-05 NOTE — Discharge Instructions (Addendum)
There is no sign of a heart attack today.  Has troponin is chronically elevated, likely because of his renal insufficiency (kidney problem).  He may be a little bit dehydrated at this time.  I recommend that he skips the evening dose of Lasix tonight and the morning dose tomorrow.  Also stop using the prednisone which may be contributing to his discomfort today.  The rash on his back seems to be better.  Follow-up with his primary care doctor or cardiologist, within the next 2 or 3 days to discuss ongoing management necessities.  Return here, if needed.

## 2019-12-06 ENCOUNTER — Encounter (HOSPITAL_COMMUNITY): Payer: Self-pay | Admitting: *Deleted

## 2019-12-06 ENCOUNTER — Other Ambulatory Visit: Payer: Self-pay

## 2019-12-06 ENCOUNTER — Inpatient Hospital Stay (HOSPITAL_COMMUNITY)
Admission: EM | Admit: 2019-12-06 | Discharge: 2019-12-13 | DRG: 871 | Disposition: A | Discharge status: Home Health | Payer: Medicare Other | Attending: Family Medicine | Admitting: Family Medicine

## 2019-12-06 ENCOUNTER — Emergency Department (HOSPITAL_COMMUNITY): Payer: Medicare Other

## 2019-12-06 DIAGNOSIS — Z8249 Family history of ischemic heart disease and other diseases of the circulatory system: Secondary | ICD-10-CM

## 2019-12-06 DIAGNOSIS — G4733 Obstructive sleep apnea (adult) (pediatric): Secondary | ICD-10-CM | POA: Diagnosis present

## 2019-12-06 DIAGNOSIS — N400 Enlarged prostate without lower urinary tract symptoms: Secondary | ICD-10-CM | POA: Diagnosis present

## 2019-12-06 DIAGNOSIS — I5032 Chronic diastolic (congestive) heart failure: Secondary | ICD-10-CM | POA: Diagnosis present

## 2019-12-06 DIAGNOSIS — T83511A Infection and inflammatory reaction due to indwelling urethral catheter, initial encounter: Secondary | ICD-10-CM | POA: Diagnosis present

## 2019-12-06 DIAGNOSIS — B965 Pseudomonas (aeruginosa) (mallei) (pseudomallei) as the cause of diseases classified elsewhere: Secondary | ICD-10-CM | POA: Diagnosis present

## 2019-12-06 DIAGNOSIS — J189 Pneumonia, unspecified organism: Secondary | ICD-10-CM | POA: Diagnosis present

## 2019-12-06 DIAGNOSIS — J9811 Atelectasis: Secondary | ICD-10-CM | POA: Diagnosis not present

## 2019-12-06 DIAGNOSIS — Z89511 Acquired absence of right leg below knee: Secondary | ICD-10-CM

## 2019-12-06 DIAGNOSIS — R338 Other retention of urine: Secondary | ICD-10-CM | POA: Diagnosis not present

## 2019-12-06 DIAGNOSIS — Z978 Presence of other specified devices: Secondary | ICD-10-CM

## 2019-12-06 DIAGNOSIS — I130 Hypertensive heart and chronic kidney disease with heart failure and stage 1 through stage 4 chronic kidney disease, or unspecified chronic kidney disease: Secondary | ICD-10-CM | POA: Diagnosis present

## 2019-12-06 DIAGNOSIS — Z823 Family history of stroke: Secondary | ICD-10-CM

## 2019-12-06 DIAGNOSIS — J810 Acute pulmonary edema: Secondary | ICD-10-CM | POA: Diagnosis not present

## 2019-12-06 DIAGNOSIS — R531 Weakness: Secondary | ICD-10-CM | POA: Diagnosis not present

## 2019-12-06 DIAGNOSIS — E1159 Type 2 diabetes mellitus with other circulatory complications: Secondary | ICD-10-CM | POA: Diagnosis not present

## 2019-12-06 DIAGNOSIS — I2510 Atherosclerotic heart disease of native coronary artery without angina pectoris: Secondary | ICD-10-CM | POA: Diagnosis present

## 2019-12-06 DIAGNOSIS — E785 Hyperlipidemia, unspecified: Secondary | ICD-10-CM | POA: Diagnosis present

## 2019-12-06 DIAGNOSIS — I517 Cardiomegaly: Secondary | ICD-10-CM | POA: Diagnosis not present

## 2019-12-06 DIAGNOSIS — D6489 Other specified anemias: Secondary | ICD-10-CM | POA: Diagnosis not present

## 2019-12-06 DIAGNOSIS — I10 Essential (primary) hypertension: Secondary | ICD-10-CM | POA: Diagnosis present

## 2019-12-06 DIAGNOSIS — J90 Pleural effusion, not elsewhere classified: Secondary | ICD-10-CM | POA: Diagnosis not present

## 2019-12-06 DIAGNOSIS — L89322 Pressure ulcer of left buttock, stage 2: Secondary | ICD-10-CM | POA: Diagnosis present

## 2019-12-06 DIAGNOSIS — N390 Urinary tract infection, site not specified: Secondary | ICD-10-CM | POA: Diagnosis present

## 2019-12-06 DIAGNOSIS — I350 Nonrheumatic aortic (valve) stenosis: Secondary | ICD-10-CM

## 2019-12-06 DIAGNOSIS — A419 Sepsis, unspecified organism: Secondary | ICD-10-CM | POA: Diagnosis not present

## 2019-12-06 DIAGNOSIS — E039 Hypothyroidism, unspecified: Secondary | ICD-10-CM | POA: Diagnosis present

## 2019-12-06 DIAGNOSIS — I482 Chronic atrial fibrillation, unspecified: Secondary | ICD-10-CM | POA: Diagnosis not present

## 2019-12-06 DIAGNOSIS — Y846 Urinary catheterization as the cause of abnormal reaction of the patient, or of later complication, without mention of misadventure at the time of the procedure: Secondary | ICD-10-CM | POA: Diagnosis present

## 2019-12-06 DIAGNOSIS — I480 Paroxysmal atrial fibrillation: Secondary | ICD-10-CM | POA: Diagnosis not present

## 2019-12-06 DIAGNOSIS — Z960 Presence of urogenital implants: Secondary | ICD-10-CM | POA: Diagnosis present

## 2019-12-06 DIAGNOSIS — R21 Rash and other nonspecific skin eruption: Secondary | ICD-10-CM | POA: Diagnosis present

## 2019-12-06 DIAGNOSIS — N401 Enlarged prostate with lower urinary tract symptoms: Secondary | ICD-10-CM | POA: Diagnosis not present

## 2019-12-06 DIAGNOSIS — Z79899 Other long term (current) drug therapy: Secondary | ICD-10-CM

## 2019-12-06 DIAGNOSIS — G319 Degenerative disease of nervous system, unspecified: Secondary | ICD-10-CM | POA: Diagnosis not present

## 2019-12-06 DIAGNOSIS — E1151 Type 2 diabetes mellitus with diabetic peripheral angiopathy without gangrene: Secondary | ICD-10-CM | POA: Diagnosis present

## 2019-12-06 DIAGNOSIS — T501X5A Adverse effect of loop [high-ceiling] diuretics, initial encounter: Secondary | ICD-10-CM | POA: Diagnosis not present

## 2019-12-06 DIAGNOSIS — R079 Chest pain, unspecified: Secondary | ICD-10-CM

## 2019-12-06 DIAGNOSIS — Z7901 Long term (current) use of anticoagulants: Secondary | ICD-10-CM

## 2019-12-06 DIAGNOSIS — E871 Hypo-osmolality and hyponatremia: Secondary | ICD-10-CM | POA: Diagnosis present

## 2019-12-06 DIAGNOSIS — N184 Chronic kidney disease, stage 4 (severe): Secondary | ICD-10-CM | POA: Diagnosis present

## 2019-12-06 DIAGNOSIS — H409 Unspecified glaucoma: Secondary | ICD-10-CM | POA: Diagnosis present

## 2019-12-06 DIAGNOSIS — Z89512 Acquired absence of left leg below knee: Secondary | ICD-10-CM

## 2019-12-06 DIAGNOSIS — L89311 Pressure ulcer of right buttock, stage 1: Secondary | ICD-10-CM | POA: Diagnosis present

## 2019-12-06 DIAGNOSIS — Z7189 Other specified counseling: Secondary | ICD-10-CM | POA: Diagnosis not present

## 2019-12-06 DIAGNOSIS — G9341 Metabolic encephalopathy: Secondary | ICD-10-CM | POA: Diagnosis present

## 2019-12-06 DIAGNOSIS — Z20822 Contact with and (suspected) exposure to covid-19: Secondary | ICD-10-CM | POA: Diagnosis present

## 2019-12-06 DIAGNOSIS — M1990 Unspecified osteoarthritis, unspecified site: Secondary | ICD-10-CM | POA: Diagnosis present

## 2019-12-06 DIAGNOSIS — K5900 Constipation, unspecified: Secondary | ICD-10-CM | POA: Diagnosis present

## 2019-12-06 DIAGNOSIS — E1122 Type 2 diabetes mellitus with diabetic chronic kidney disease: Secondary | ICD-10-CM | POA: Diagnosis present

## 2019-12-06 DIAGNOSIS — Z515 Encounter for palliative care: Secondary | ICD-10-CM

## 2019-12-06 DIAGNOSIS — N1831 Chronic kidney disease, stage 3a: Secondary | ICD-10-CM | POA: Diagnosis not present

## 2019-12-06 DIAGNOSIS — R54 Age-related physical debility: Secondary | ICD-10-CM | POA: Diagnosis present

## 2019-12-06 DIAGNOSIS — Z79890 Hormone replacement therapy (postmenopausal): Secondary | ICD-10-CM

## 2019-12-06 DIAGNOSIS — E1165 Type 2 diabetes mellitus with hyperglycemia: Secondary | ICD-10-CM | POA: Diagnosis present

## 2019-12-06 DIAGNOSIS — Z87891 Personal history of nicotine dependence: Secondary | ICD-10-CM

## 2019-12-06 DIAGNOSIS — I252 Old myocardial infarction: Secondary | ICD-10-CM

## 2019-12-06 LAB — RESP PANEL BY RT-PCR (FLU A&B, COVID) ARPGX2
Influenza A by PCR: NEGATIVE
Influenza B by PCR: NEGATIVE
SARS Coronavirus 2 by RT PCR: NEGATIVE

## 2019-12-06 LAB — CBC WITH DIFFERENTIAL/PLATELET
Abs Immature Granulocytes: 0.16 10*3/uL — ABNORMAL HIGH (ref 0.00–0.07)
Basophils Absolute: 0.1 10*3/uL (ref 0.0–0.1)
Basophils Relative: 0 %
Eosinophils Absolute: 0.3 10*3/uL (ref 0.0–0.5)
Eosinophils Relative: 1 %
HCT: 37.1 % — ABNORMAL LOW (ref 39.0–52.0)
Hemoglobin: 12.7 g/dL — ABNORMAL LOW (ref 13.0–17.0)
Immature Granulocytes: 1 %
Lymphocytes Relative: 12 %
Lymphs Abs: 2.6 10*3/uL (ref 0.7–4.0)
MCH: 32.6 pg (ref 26.0–34.0)
MCHC: 34.2 g/dL (ref 30.0–36.0)
MCV: 95.4 fL (ref 80.0–100.0)
Monocytes Absolute: 1.8 10*3/uL — ABNORMAL HIGH (ref 0.1–1.0)
Monocytes Relative: 8 %
Neutro Abs: 17.8 10*3/uL — ABNORMAL HIGH (ref 1.7–7.7)
Neutrophils Relative %: 78 %
Platelets: 220 10*3/uL (ref 150–400)
RBC: 3.89 MIL/uL — ABNORMAL LOW (ref 4.22–5.81)
RDW: 14.6 % (ref 11.5–15.5)
WBC: 22.6 10*3/uL — ABNORMAL HIGH (ref 4.0–10.5)
nRBC: 0 % (ref 0.0–0.2)

## 2019-12-06 LAB — COMPREHENSIVE METABOLIC PANEL
ALT: 23 U/L (ref 0–44)
AST: 26 U/L (ref 15–41)
Albumin: 3.6 g/dL (ref 3.5–5.0)
Alkaline Phosphatase: 48 U/L (ref 38–126)
Anion gap: 13 (ref 5–15)
BUN: 49 mg/dL — ABNORMAL HIGH (ref 8–23)
CO2: 23 mmol/L (ref 22–32)
Calcium: 8.9 mg/dL (ref 8.9–10.3)
Chloride: 94 mmol/L — ABNORMAL LOW (ref 98–111)
Creatinine, Ser: 2.53 mg/dL — ABNORMAL HIGH (ref 0.61–1.24)
GFR, Estimated: 23 mL/min — ABNORMAL LOW (ref >60)
Glucose, Bld: 192 mg/dL — ABNORMAL HIGH (ref 70–99)
Potassium: 3.6 mmol/L (ref 3.5–5.1)
Sodium: 130 mmol/L — ABNORMAL LOW (ref 135–145)
Total Bilirubin: 1 mg/dL (ref 0.3–1.2)
Total Protein: 7.6 g/dL (ref 6.5–8.1)

## 2019-12-06 LAB — URINALYSIS, ROUTINE W REFLEX MICROSCOPIC
Bilirubin Urine: NEGATIVE
Glucose, UA: NEGATIVE mg/dL
Ketones, ur: NEGATIVE mg/dL
Nitrite: POSITIVE — AB
Protein, ur: 100 mg/dL — AB
Specific Gravity, Urine: 1.012 (ref 1.005–1.030)
WBC, UA: >50 WBC/hpf — ABNORMAL HIGH (ref 0–5)
pH: 6 (ref 5.0–8.0)

## 2019-12-06 LAB — GLUCOSE, CAPILLARY
Glucose-Capillary: 203 mg/dL — ABNORMAL HIGH (ref 70–99)
Glucose-Capillary: 216 mg/dL — ABNORMAL HIGH (ref 70–99)

## 2019-12-06 LAB — LACTIC ACID, PLASMA
Lactic Acid, Venous: 1.8 mmol/L (ref 0.5–1.9)
Lactic Acid, Venous: 1.7 mmol/L (ref 0.5–1.9)

## 2019-12-06 LAB — CBG MONITORING, ED: Glucose-Capillary: 178 mg/dL — ABNORMAL HIGH (ref 70–99)

## 2019-12-06 LAB — APTT: aPTT: 48 seconds — ABNORMAL HIGH (ref 24–36)

## 2019-12-06 LAB — PROTIME-INR
INR: 1.3 — ABNORMAL HIGH (ref 0.8–1.2)
Prothrombin Time: 15.6 seconds — ABNORMAL HIGH (ref 11.4–15.2)

## 2019-12-06 LAB — TROPONIN I (HIGH SENSITIVITY)
Troponin I (High Sensitivity): 280 ng/L (ref <18)
Troponin I (High Sensitivity): 327 ng/L (ref <18)

## 2019-12-06 MED ORDER — FINASTERIDE 5 MG PO TABS
5.0000 mg | ORAL_TABLET | Freq: Every evening | ORAL | Status: DC
  Administered 2019-12-06 – 2019-12-12 (×6): 5 mg via ORAL
  Filled 2019-12-06 (×8): qty 1

## 2019-12-06 MED ORDER — FERROUS SULFATE 325 (65 FE) MG PO TABS
325.0000 mg | ORAL_TABLET | Freq: Two times a day (BID) | ORAL | Status: DC
  Administered 2019-12-06 – 2019-12-13 (×12): 325 mg via ORAL
  Filled 2019-12-06 (×12): qty 1

## 2019-12-06 MED ORDER — SODIUM CHLORIDE 0.9 % IV SOLN: INTRAVENOUS | Status: DC

## 2019-12-06 MED ORDER — APIXABAN 2.5 MG PO TABS
2.5000 mg | ORAL_TABLET | Freq: Two times a day (BID) | ORAL | Status: DC
  Administered 2019-12-06 – 2019-12-13 (×15): 2.5 mg via ORAL
  Filled 2019-12-06 (×15): qty 1

## 2019-12-06 MED ORDER — CLOPIDOGREL BISULFATE 75 MG PO TABS
75.0000 mg | ORAL_TABLET | Freq: Every day | ORAL | Status: DC
  Administered 2019-12-06 – 2019-12-13 (×8): 75 mg via ORAL
  Filled 2019-12-06 (×8): qty 1

## 2019-12-06 MED ORDER — CARVEDILOL 3.125 MG PO TABS
3.1250 mg | ORAL_TABLET | Freq: Two times a day (BID) | ORAL | Status: DC
  Administered 2019-12-06 – 2019-12-09 (×6): 3.125 mg via ORAL
  Filled 2019-12-06 (×6): qty 1

## 2019-12-06 MED ORDER — ACETAMINOPHEN 325 MG PO TABS
650.0000 mg | ORAL_TABLET | Freq: Four times a day (QID) | ORAL | Status: DC | PRN
  Administered 2019-12-08: 650 mg via ORAL
  Filled 2019-12-06: qty 2

## 2019-12-06 MED ORDER — VITAMIN D 25 MCG (1000 UNIT) PO TABS
5000.0000 [IU] | ORAL_TABLET | Freq: Every evening | ORAL | Status: DC
  Administered 2019-12-06 – 2019-12-12 (×6): 5000 [IU] via ORAL
  Filled 2019-12-06 (×6): qty 5

## 2019-12-06 MED ORDER — TRAZODONE HCL 50 MG PO TABS
50.0000 mg | ORAL_TABLET | Freq: Every evening | ORAL | Status: DC | PRN
  Administered 2019-12-06 – 2019-12-12 (×5): 50 mg via ORAL
  Filled 2019-12-06 (×5): qty 1

## 2019-12-06 MED ORDER — SODIUM CHLORIDE 0.9% FLUSH
3.0000 mL | Freq: Two times a day (BID) | INTRAVENOUS | Status: DC
  Administered 2019-12-06 – 2019-12-13 (×9): 3 mL via INTRAVENOUS

## 2019-12-06 MED ORDER — FAMOTIDINE 20 MG PO TABS
20.0000 mg | ORAL_TABLET | Freq: Every day | ORAL | Status: DC
  Administered 2019-12-07 – 2019-12-13 (×6): 20 mg via ORAL
  Filled 2019-12-06 (×6): qty 1

## 2019-12-06 MED ORDER — ONDANSETRON HCL 4 MG PO TABS: 4.0000 mg | ORAL_TABLET | Freq: Four times a day (QID) | ORAL | Status: DC | PRN

## 2019-12-06 MED ORDER — LACTATED RINGERS IV SOLN: INTRAVENOUS | Status: AC

## 2019-12-06 MED ORDER — VANCOMYCIN HCL IN DEXTROSE 1-5 GM/200ML-% IV SOLN: 1000.0000 mg | Freq: Once | INTRAVENOUS | Status: DC

## 2019-12-06 MED ORDER — LATANOPROST 0.005 % OP SOLN
1.0000 [drp] | Freq: Every day | OPHTHALMIC | Status: DC
  Administered 2019-12-06 – 2019-12-12 (×7): 1 [drp] via OPHTHALMIC
  Filled 2019-12-06 (×2): qty 2.5

## 2019-12-06 MED ORDER — SODIUM CHLORIDE 0.9% FLUSH: 3.0000 mL | INTRAVENOUS | Status: DC | PRN

## 2019-12-06 MED ORDER — DORZOLAMIDE HCL-TIMOLOL MAL 2-0.5 % OP SOLN
1.0000 [drp] | Freq: Two times a day (BID) | OPHTHALMIC | Status: DC
  Administered 2019-12-07 – 2019-12-12 (×11): 1 [drp] via OPHTHALMIC
  Filled 2019-12-06 (×4): qty 10

## 2019-12-06 MED ORDER — LEVOTHYROXINE SODIUM 75 MCG PO TABS
75.0000 ug | ORAL_TABLET | Freq: Every day | ORAL | Status: DC
  Administered 2019-12-08 – 2019-12-13 (×6): 75 ug via ORAL
  Filled 2019-12-06 (×8): qty 1

## 2019-12-06 MED ORDER — BISACODYL 10 MG RE SUPP
10.0000 mg | Freq: Once | RECTAL | Status: AC
  Administered 2019-12-06: 10 mg via RECTAL
  Filled 2019-12-06: qty 1

## 2019-12-06 MED ORDER — ONDANSETRON HCL 4 MG/2ML IJ SOLN: 4.0000 mg | Freq: Four times a day (QID) | INTRAMUSCULAR | Status: DC | PRN

## 2019-12-06 MED ORDER — ACETAMINOPHEN 650 MG RE SUPP: 650.0000 mg | Freq: Four times a day (QID) | RECTAL | Status: DC | PRN

## 2019-12-06 MED ORDER — LACTATED RINGERS IV BOLUS (SEPSIS)
500.0000 mL | Freq: Once | INTRAVENOUS | Status: AC
  Administered 2019-12-06: 500 mL via INTRAVENOUS

## 2019-12-06 MED ORDER — ASCORBIC ACID 500 MG PO TABS
1000.0000 mg | ORAL_TABLET | Freq: Every day | ORAL | Status: DC
  Administered 2019-12-06 – 2019-12-13 (×7): 1000 mg via ORAL
  Filled 2019-12-06 (×7): qty 2

## 2019-12-06 MED ORDER — ATORVASTATIN CALCIUM 40 MG PO TABS
40.0000 mg | ORAL_TABLET | Freq: Every evening | ORAL | Status: DC
  Administered 2019-12-06 – 2019-12-12 (×6): 40 mg via ORAL
  Filled 2019-12-06 (×6): qty 1

## 2019-12-06 MED ORDER — SODIUM CHLORIDE 0.9 % IV SOLN
2.0000 g | Freq: Once | INTRAVENOUS | Status: AC
  Administered 2019-12-06: 2 g via INTRAVENOUS
  Filled 2019-12-06: qty 2

## 2019-12-06 MED ORDER — INSULIN ASPART 100 UNIT/ML ~~LOC~~ SOLN
0.0000 [IU] | Freq: Every day | SUBCUTANEOUS | Status: DC
  Administered 2019-12-06 – 2019-12-10 (×5): 2 [IU] via SUBCUTANEOUS
  Administered 2019-12-11: 3 [IU] via SUBCUTANEOUS
  Administered 2019-12-12: 2 [IU] via SUBCUTANEOUS

## 2019-12-06 MED ORDER — VITAMIN E 180 MG (400 UNIT) PO CAPS
400.0000 [IU] | ORAL_CAPSULE | Freq: Every day | ORAL | Status: DC
  Administered 2019-12-06 – 2019-12-13 (×7): 400 [IU] via ORAL
  Filled 2019-12-06 (×10): qty 1

## 2019-12-06 MED ORDER — INSULIN ASPART 100 UNIT/ML ~~LOC~~ SOLN
0.0000 [IU] | Freq: Three times a day (TID) | SUBCUTANEOUS | Status: DC
  Administered 2019-12-06 – 2019-12-07 (×2): 3 [IU] via SUBCUTANEOUS
  Administered 2019-12-07 – 2019-12-09 (×6): 2 [IU] via SUBCUTANEOUS
  Administered 2019-12-09: 3 [IU] via SUBCUTANEOUS
  Administered 2019-12-09: 5 [IU] via SUBCUTANEOUS
  Administered 2019-12-10: 3 [IU] via SUBCUTANEOUS
  Administered 2019-12-10 (×2): 2 [IU] via SUBCUTANEOUS
  Administered 2019-12-11: 5 [IU] via SUBCUTANEOUS
  Administered 2019-12-11: 2 [IU] via SUBCUTANEOUS
  Administered 2019-12-11 – 2019-12-12 (×2): 3 [IU] via SUBCUTANEOUS
  Administered 2019-12-12: 5 [IU] via SUBCUTANEOUS
  Administered 2019-12-12 – 2019-12-13 (×2): 2 [IU] via SUBCUTANEOUS
  Administered 2019-12-13: 3 [IU] via SUBCUTANEOUS

## 2019-12-06 MED ORDER — VANCOMYCIN HCL 1500 MG/300ML IV SOLN
1500.0000 mg | INTRAVENOUS | Status: DC
  Administered 2019-12-08 – 2019-12-12 (×3): 1500 mg via INTRAVENOUS
  Filled 2019-12-06 (×5): qty 300

## 2019-12-06 MED ORDER — VANCOMYCIN HCL 2000 MG/400ML IV SOLN
2000.0000 mg | Freq: Once | INTRAVENOUS | Status: AC
  Administered 2019-12-06: 2000 mg via INTRAVENOUS
  Filled 2019-12-06: qty 400

## 2019-12-06 MED ORDER — SODIUM CHLORIDE 0.9% FLUSH
3.0000 mL | Freq: Two times a day (BID) | INTRAVENOUS | Status: DC
  Administered 2019-12-06 – 2019-12-13 (×5): 3 mL via INTRAVENOUS

## 2019-12-06 MED ORDER — LACTULOSE 10 GM/15ML PO SOLN
30.0000 g | Freq: Once | ORAL | Status: AC
  Administered 2019-12-06: 30 g via ORAL
  Filled 2019-12-06: qty 60

## 2019-12-06 MED ORDER — SODIUM CHLORIDE 0.9 % IV SOLN
1.0000 g | INTRAVENOUS | Status: DC
  Administered 2019-12-07 – 2019-12-12 (×6): 1 g via INTRAVENOUS
  Filled 2019-12-06 (×9): qty 1

## 2019-12-06 MED ORDER — VITAMIN B-6 50 MG PO TABS
100.0000 mg | ORAL_TABLET | Freq: Every day | ORAL | Status: DC
  Administered 2019-12-06 – 2019-12-13 (×7): 100 mg via ORAL
  Filled 2019-12-06: qty 2
  Filled 2019-12-06 (×2): qty 1
  Filled 2019-12-06: qty 2
  Filled 2019-12-06: qty 1
  Filled 2019-12-06 (×5): qty 2

## 2019-12-06 MED ORDER — POLYETHYLENE GLYCOL 3350 17 G PO PACK
17.0000 g | PACK | Freq: Every day | ORAL | Status: DC | PRN
  Administered 2019-12-12: 17 g via ORAL
  Filled 2019-12-06: qty 1

## 2019-12-06 MED ORDER — AMLODIPINE BESYLATE 5 MG PO TABS
10.0000 mg | ORAL_TABLET | Freq: Every day | ORAL | Status: DC
  Administered 2019-12-06 – 2019-12-13 (×8): 10 mg via ORAL
  Filled 2019-12-06 (×8): qty 2

## 2019-12-06 MED ORDER — BISACODYL 10 MG RE SUPP: 10.0000 mg | Freq: Every day | RECTAL | Status: DC | PRN

## 2019-12-06 MED ORDER — SODIUM CHLORIDE 0.9 % IV SOLN: 250.0000 mL | INTRAVENOUS | Status: DC | PRN

## 2019-12-06 NOTE — Progress Notes (Signed)
Palliative: Thank you for this consult. Unfortunately due to high volume of consults there will be a delay in a Palliative Provider seeing this patient. Palliative Medicine will return to service on 12/07/2019 and will see patient at that time.  No charge Devone Tousley, NP Palliative Medicine Please call Palliative Medicine team phone with any questions [phone]. For individual providers please see AMION.  

## 2019-12-06 NOTE — ED Triage Notes (Signed)
Pt's son reports pt was here at [facility] yesterday c/o chest pain. They were told it was probably due to acid reflux. Pt went home and slept all night. Pt's son reports when he saw him this morning at 0800 he was still c/o chest pain but that he appeared altered. Pt was not responding per his normal. Pt was LKW at [place] last night. Pt will follow some commands, but not all. Pt appears lethargic.

## 2019-12-06 NOTE — ED Notes (Signed)
ED Provider at bedside. 

## 2019-12-06 NOTE — H&P (Addendum)
Patient Demographics:    [name], is a 86 y.o. male  MRN: [id number]   DOB - June 15, 1927  Admit Date - 12/06/2019  Outpatient Primary MD for the patient is Travis School, MD   Assessment & Plan:    Principal Problem:   Sepsis due to PNA Active Problems:   PNA (pneumonia)   Acute metabolic encephalopathy   Atrial fibrillation, chronic   Chronic indwelling Foley catheter   Moderate aortic stenosis   Hypothyroidism   Chronic diastolic CHF (congestive heart failure) (HCC)   PAF (paroxysmal atrial fibrillation) (HCC)   BPH (benign prostatic hyperplasia)   Type 2 diabetes mellitus with vascular disease ([place])   Chronic kidney disease, stage 3a   Essential hypertension   S/P bilateral BKA (below knee amputation) ([place])   Aortic valve stenosis   A/p 1) sepsis secondary to pneumonia with acute metabolic encephalopathy--- cannot exclude superimposed UTI -CT head negative, Covid negative -Patient met sepsis criteria on admission with leukocytosis, fevers and tachypnea -Treat empirically with vancomycin and cefepime pending further culture data  2)CAD--recent NSTEMI in October 2021--- troponins are at his baseline, patient is not a candidate for intervention due to CKD stage IV -Currently does not appear to have ACS -Continue Lipitor, Coreg and Plavix  3)HFpEF--patient has history of chronic diastolic dysfunction CHF, EF by echo from August 2021 is  55 to 60% -No evidence of significant seizure exacerbation at this time -Monitor closely with IV fluids for sepsis  4)CKD IV--creatinine currently around 2.5 which is close to patient's baseline, bicarb is 23, monitor closely -renally adjust medications, avoid nephrotoxic agents / dehydration  / hypotension  5) hypothyroidism--stable, continue levothyroxine 75  mcg daily  6)BPH with LUTs-chronic renal retention status post Foley catheter placement -Continue chronic indwelling Foley, continue Proscar  7)DM2-A1c is 8.5 reflecting uncontrolled DM with hyperglycemia PTA -Hold glipizide Use Novolog/Humalog Sliding scale insulin with Accu-Cheks/Fingersticks as ordered  7) chronic atrial fibrillation--- continue apixaban for stroke prophylaxis, continue Coreg for rate control  8)HTN-continue amlodipine and Coreg for BP control   9)Social/Ethics--plan of care, CODE STATUS and advanced directive discussed with patient and patient's son [name] at bedside, they request No intubation, patient is partial code CPR is okay pressors and BiPAP are okay  10) chronic anemia--- hemoglobin currently above 12 which is patient's baseline -No evidence of ongoing bleeding, monitor closely  11) hyponatremia--sodium is 130 suspect dehydration related, IV fluids as ordered  Disposition/Need for in-Hospital Stay- patient unable to be discharged at this time due to --sepsis requiring IV antibiotics and IV fluids*  Status is: Inpatient  Remains inpatient appropriate because:sepsis requiring IV antibiotics and IV fluids*   Dispo: The patient is from: Home              Anticipated d/c is to: Home              Anticipated d/c date is: 2 days  Patient currently is not medically stable to d/c. Barriers: Not Clinically Stable- sepsis requiring IV antibiotics and IV fluids*  With History of - Reviewed by me  Past Medical History:  Diagnosis Date  . Arthritis   . Borderline diabetic   . CHF (congestive heart failure) ([place])   . Chronic kidney disease   . Diabetes mellitus without complication ([place])   . Dysrhythmia   . Glaucoma   . Hyperlipidemia   . Hypertension   . Hypothyroidism   . Myocardial infarction ([place])   . PVD (peripheral vascular disease) ([place])   . Sleep apnea    Cpap ordered but doesnt use  . Urinary retention due to benign prostatic  hyperplasia 12/28/2018      Past Surgical History:  Procedure Laterality Date  . ABDOMINAL AORTOGRAM W/LOWER EXTREMITY N/A 07/27/2018   Procedure: ABDOMINAL AORTOGRAM W/LOWER EXTREMITY;  Surgeon: Lorretta Harp, MD;  Location: [facility];  Service: Cardiovascular;  Laterality: N/A;  . AMPUTATION Right 08/28/2018   Procedure: RIGHT BELOW KNEE AMPUTATION;  Surgeon: [name], MD;  Location: [facility];  Service: Orthopedics;  Laterality: Right;  . AMPUTATION Left 12/02/2018   Procedure: LEFT TRANSMETATARSAL AMPUTATION AND NEGATIVE PRESSURE WOUND VAC PLACEMENT;  Surgeon: [name], MD;  Location: [facility];  Service: Orthopedics;  Laterality: Left;  . AMPUTATION Left 12/18/2018   Procedure: LEFT BELOW KNEE AMPUTATION;  Surgeon: [name], MD;  Location: [facility];  Service: Orthopedics;  Laterality: Left;  . APPENDECTOMY  1943  . BELOW KNEE LEG AMPUTATION Left 12/18/2018  . CATARACT EXTRACTION  2012   x2  . COLONOSCOPY N/A 11/01/2014   Procedure: COLONOSCOPY;  Surgeon: Aviva Signs, MD;  [facility];  Service: Gastroenterology;  Laterality: N/A;  . ESOPHAGOGASTRODUODENOSCOPY N/A 11/01/2014   Procedure: ESOPHAGOGASTRODUODENOSCOPY (EGD);  Surgeon: Aviva Signs, MD;  [facility];  Service: Gastroenterology;  Laterality: N/A;  . HERNIA REPAIR Left   . INGUINAL HERNIA REPAIR Right 03/01/2013   Procedure: RIGHT INGUINAL HERNIORRHAPHY;  Surgeon: Jamesetta So, MD;  Location: [facility];  Service: General;  Laterality: Right;  . INSERTION OF MESH Right 03/01/2013   Procedure: INSERTION OF MESH;  Surgeon: Jamesetta So, MD;  Location[facility];  Service: General;  Laterality: Right;  . LOWER EXTREMITY ANGIOGRAPHY Right 08/25/2018   Procedure: LOWER EXTREMITY ANGIOGRAPHY;  Surgeon: Wellington Hampshire, MD;  Location: [facility];  Service: Cardiovascular;  Laterality: Right;  . LOWER EXTREMITY ANGIOGRAPHY N/A 11/25/2018   Procedure: LOWER EXTREMITY ANGIOGRAPHY;   Surgeon: Wellington Hampshire, MD;  Location: [facility];  Service: Cardiovascular;  Laterality: N/A;  . NM MYOCAR PERF WALL MOTION  06/05/2010   no significant ischemia  . PERIPHERAL VASCULAR ATHERECTOMY Right 08/03/2018   Procedure: PERIPHERAL VASCULAR ATHERECTOMY;  Surgeon: Lorretta Harp, MD;  Location: [facility];  Service: Cardiovascular;  Laterality: Right;  . PERIPHERAL VASCULAR ATHERECTOMY Left 11/23/2018   Procedure: PERIPHERAL VASCULAR ATHERECTOMY;  Surgeon: Lorretta Harp, MD;  Location: [facility];  Service: Cardiovascular;  Laterality: Left;  sfa with dc balloon  . PERIPHERAL VASCULAR BALLOON ANGIOPLASTY Left 11/25/2018   Procedure: PERIPHERAL VASCULAR BALLOON ANGIOPLASTY;  Surgeon: Wellington Hampshire, MD;  Location: [facility];  Service: Cardiovascular;  Laterality: Left;  popliteal  . PERIPHERAL VASCULAR INTERVENTION Left 11/25/2018   Procedure: PERIPHERAL VASCULAR INTERVENTION;  Surgeon: Wellington Hampshire, MD;  Location: [facility];  Service: Cardiovascular;  Laterality: Left;  tibial peroneal trunk and peroneal stents   . stent  06/14/2010   left leg  . STUMP REVISION Left 01/20/2019   Procedure: REVISION LEFT BELOW KNEE AMPUTATION;  Surgeon: [name], MD;  Location: [facility];  Service: Orthopedics;  Laterality: Left;  . US ECHOCARDIOGRAPHY  01/21/2006   moderate mitral annular ca+, mild MR, AOV moderately sclerotic.    Chief Complaint  Patient presents with  . Altered Mental Status      HPI:    [name]  is a 86 y.o. male with a history of DM, HTN, HLD, PAD s/p bilat BKA, AS, PAF, D-CHF, CKD IV, OSA not on CPAP, RBBB, bradycardia, hypothyroidism., CAD (NSTEMI 10/2019) presents to the ED for the second time in 24 hours initially with chest discomfort and now with altered mentation and fevers -Additional history obtained from patient's son [name] at bedside--as per patient's son patient becomes lethargic and confused usually when  he gets infection -Work-up in the ED suggestive of possible pneumonia left more than right  and possible UTI -CT head without acute findings -Covid negative --Troponin between 280 and  low 300s which is patient's baseline -WBC 22.6, lactic acid is not elevated hemoglobin is 12.7 which is patient's baseline, -Creatinine is 2.53 which is close to patient's baseline, bicarb is 23 -LFTs are not elevated -Also complains of constipation -He has an indwelling Foley   Review of systems:    In addition to the HPI above,   A full Review of  Systems was done, all other systems reviewed are negative except as noted above in HPI , .    Social History:  Reviewed by me    Social History   Tobacco Use  . Smoking status: Former Smoker    Quit date: 01/13/1965    Years since quitting: 54.9  . Smokeless tobacco: Former Systems developer    Types: Chew  Substance Use Topics  . Alcohol use: No    Family History :  Reviewed by me    Family History  Problem Relation Age of Onset  . Cancer Mother   . Heart attack Father   . Stroke Father   . Heart attack Brother   . Heart attack Brother      Home Medications:   Prior to Admission medications   Medication Sig Start Date End Date Taking? Authorizing Provider  acetaminophen (TYLENOL) 500 MG tablet Take 500 mg by mouth every 6 (six) hours as needed for headache (pain).   Yes [provider]  amLODipine (NORVASC) 10 MG tablet Take 1 tablet (10 mg total) by mouth daily. 11/16/19 02/14/20 Yes Hilty, Nadean Corwin, MD  apixaban (ELIQUIS) 5 MG TABS tablet Take 2.5 mg by mouth 2 (two) times daily.    Yes [provider]  Ascorbic Acid (VITAMIN C) 1000 MG tablet Take 1,000 mg by mouth daily.    Yes [provider]  atorvastatin (LIPITOR) 40 MG tablet Take 1 tablet (40 mg total) by mouth every evening. 10/25/19  Yes Barrett, Evelene Croon, PA-C  betamethasone dipropionate 0.05 % cream Apply 1 application topically 2 (two) times daily.   12/03/19  Yes [provider]  carvedilol (COREG) 3.125 MG tablet Take 1 tablet (3.125 mg total) by mouth 2 (two) times daily. 10/25/19 01/23/20 Yes Barrett, Evelene Croon, PA-C  Cholecalciferol (VITAMIN D-3) 125 MCG (5000 UT) TABS Take 5,000 Units by mouth every evening.   Yes [provider]  clopidogrel (PLAVIX) 75 MG tablet Take 1  tablet (75 mg total) by mouth daily with breakfast. 12/03/19  Yes Hilty, Nadean Corwin, MD  famotidine (PEPCID) 20 MG tablet Take 1 tablet (20 mg total) by mouth daily. 11/12/18  Yes Johnson, Clanford L, MD  ferrous sulfate 325 (65 FE) MG tablet Take 325 mg by mouth 2 (two) times daily with a meal.   Yes [provider]  furosemide (LASIX) 40 MG tablet Take 20-40 mg by mouth See admin instructions. Take one tablet (40 mg) by mouth every morning and 1/2 tablet (20 mg) every evening   Yes [provider]  glipiZIDE (GLUCOTROL) 5 MG tablet Take 5 mg by mouth 2 (two) times daily.    Yes [provider]  latanoprost (XALATAN) 0.005 % ophthalmic solution Place 1 drop into both eyes at bedtime. 09/30/19  Yes [provider]  Misc Natural Products ([place] MSM FORMULA PO) Take 1 tablet by mouth 2 (two) times daily.   Yes [provider]  polyethylene glycol (MIRALAX) 17 g packet Take 17 g by mouth daily as needed for moderate constipation. 12/09/18  Yes Mercy Riding, MD  Potassium Gluconate 550 (90 K) MG TABS Take 90 mg by mouth 2 (two) times daily.   Yes [provider]  traMADol (ULTRAM) 50 MG tablet Take 1 tablet (50 mg total) by mouth every 6 (six) hours as needed. Patient taking differently: Take 50 mg by mouth every 6 (six) hours as needed (pain).  02/15/19  Yes Lovorn, Megan, MD  vitamin E 400 UNIT capsule Take 400 Units by mouth daily.   Yes [provider]  ciprofloxacin (CIPRO) 250 MG tablet Take 250 mg by mouth 2 (two) times daily. Patient not taking: Reported on 12/06/2019 11/01/19    [provider]  dorzolamide-timolol (COSOPT) 22.3-6.8 MG/ML ophthalmic solution Place 1 drop into both eyes 2 (two) times daily.  Patient not taking: Reported on 12/06/2019    [provider]  finasteride (PROSCAR) 5 MG tablet Take 1 tablet (5 mg total) by mouth every evening. Patient not taking: Reported on 12/06/2019 09/22/18   Cathlyn Parsons, PA-C  hydrOXYzine (ATARAX/VISTARIL) 25 MG tablet Take 25 mg by mouth 4 (four) times daily. Patient not taking: Reported on 12/06/2019 11/30/19   [provider]  levothyroxine (SYNTHROID) 75 MCG tablet Take 75 mcg by mouth daily before breakfast.  02/12/19   [provider]  [facility] ULTRA test strip 1 each 2 (two) times daily. 04/15/19   [provider]  pyridoxine (B-6) 100 MG tablet Take 100 mg by mouth daily.    [provider]  tamsulosin (FLOMAX) 0.4 MG CAPS capsule Take 0.4 mg by mouth daily. 11/13/19   [provider]     Allergies:     Allergies  Allergen Reactions  . Iodinated Diagnostic Agents Other (See Comments)    caused Fever  . Dye Fdc Red [Red Dye] Hives    Tolerated red Docusate, Niferex     Physical Exam:   Vitals  Blood pressure (!) 162/80, pulse 66, temperature 99.9 F (37.7 C), temperature source Axillary, resp. rate 16, height _0  (1.727 m), weight 103.3 kg, SpO2 91 %.  Physical Examination: General appearance - chronically ill appearing, and in no distress Mental status -disoriented, Eyes - sclera anicteric Neck - supple, no JVD elevation , Chest -diminished in bases, scattered rhonchi  heart - S1 and S2 normal, irregular  Abdomen - soft, nontender, nondistended, +Bs Neurological -confused and disoriented  Skin - warm, dry  MSK-bilateral BKA GU-chronic indwelling Foley     Data Review:    CBC Recent Labs  Lab 12/05/19 1155 12/06/19 1144  WBC 16.6* 22.6*  HGB 12.5* 12.7*  HCT 36.7* 37.1*  PLT 233 220  MCV 95.8 95.4  MCH 32.6 32.6   MCHC 34.1 34.2  RDW 14.4 14.6  LYMPHSABS  --  2.6  MONOABS  --  1.8*  EOSABS  --  0.3  BASOSABS  --  0.1   ------------------------------------------------------------------------------------------------------------------  Chemistries  Recent Labs  Lab 12/05/19 1155 12/06/19 1144  NA 130* 130*  K 3.8 3.6  CL 92* 94*  CO2 27 23  GLUCOSE 443* 192*  BUN 48* 49*  CREATININE 2.84* 2.53*  CALCIUM 8.9 8.9  AST  --  26  ALT  --  23  ALKPHOS  --  48  BILITOT  --  1.0   ------------------------------------------------------------------------------------------------------------------ estimated creatinine clearance is 22.6 mL/min (A) (by C-G formula based on SCr of 2.53 mg/dL (H)). ------------------------------------------------------------------------------------------------------------------ No results for input(s): TSH, T4TOTAL, T3FREE, THYROIDAB in the last 72 hours.  Invalid input(s): FREET3   Coagulation profile Recent Labs  Lab 12/06/19 1144  INR 1.3*   ------------------------------------------------------------------------------------------------------------------- No results for input(s): DDIMER in the last 72 hours. -------------------------------------------------------------------------------------------------------------------  Cardiac Enzymes No results for input(s): CKMB, TROPONINI, MYOGLOBIN in the last 168 hours.  Invalid input(s): CK ------------------------------------------------------------------------------------------------------------------    Component Value Date/Time   BNP 249.1 (H) 12/09/2018 0343     ---------------------------------------------------------------------------------------------------------------  Urinalysis    Component Value Date/Time   COLORURINE YELLOW 12/06/2019 1406   APPEARANCEUR CLOUDY (A) 12/06/2019 1406   LABSPEC 1.012 12/06/2019 1406   PHURINE 6.0 12/06/2019 1406   GLUCOSEU NEGATIVE 12/06/2019 1406   HGBUR  MODERATE (A) 12/06/2019 1406   BILIRUBINUR NEGATIVE 12/06/2019 1406   KETONESUR NEGATIVE 12/06/2019 1406   PROTEINUR 100 (A) 12/06/2019 1406   NITRITE POSITIVE (A) 12/06/2019 1406   LEUKOCYTESUR LARGE (A) 12/06/2019 1406    ----------------------------------------------------------------------------------------------------------------   Imaging Results:    CT Head Wo Contrast  Result Date: 12/06/2019 CLINICAL DATA:  Mental status change. EXAM: CT HEAD WITHOUT CONTRAST TECHNIQUE: Contiguous axial images were obtained from the base of the skull through the vertex without intravenous contrast. COMPARISON:  MRI January 1921. FINDINGS: Brain: No evidence of acute large vascular territory infarction, hemorrhage, hydrocephalus, extra-axial collection or mass lesion/mass effect. Patchy white matter hypoattenuation, likely related to chronic microvascular ischemic disease. Mild generalized cerebral atrophy with ex vacuo ventricular dilation. Vascular: Calcific atherosclerosis. Skull: No acute fracture. Sinuses/Orbits: Left maxillary sinus retention cyst versus polyp. Otherwise, sinuses are clear. Unremarkable orbits. Other: Motion limited. IMPRESSION: No evidence of acute intracranial abnormality on this motion limited exam. Electronically Signed   By: Margaretha Sheffield MD   On: 12/06/2019 13:05   DG Chest Port 1 View  Result Date: 12/06/2019 CLINICAL DATA:  Chest pain, sepsis. EXAM: PORTABLE CHEST 1 VIEW COMPARISON:  December 05, 2019. FINDINGS: Stable cardiomediastinal silhouette. Increased left basilar opacity is noted concerning for pneumonia or atelectasis with associated pleural effusion. No pneumothorax is noted. Minimal right basilar subsegmental atelectasis is noted. Bony thorax is unremarkable. IMPRESSION: Increased left basilar opacity is noted concerning for pneumonia or atelectasis with associated pleural effusion. Minimal right basilar subsegmental atelectasis is noted. Electronically Signed    By: Marijo Conception M.D.   On: 12/06/2019 12:56   DG Chest Portable 1 View  Result Date: 12/05/2019 CLINICAL DATA:  Chest pain EXAM: PORTABLE CHEST 1 VIEW COMPARISON:  10/09/2019 FINDINGS: Chronic  mild interstitial prominence. Patchy density at the left lung base. Stable cardiomediastinal contours. No pneumothorax. IMPRESSION: Patchy left basilar atelectasis/consolidation with possible small pleural effusion. Electronically Signed   By: Macy Mis M.D.   On: 12/05/2019 13:05    Radiological Exams on Admission: CT Head Wo Contrast  Result Date: 12/06/2019 CLINICAL DATA:  Mental status change. EXAM: CT HEAD WITHOUT CONTRAST TECHNIQUE: Contiguous axial images were obtained from the base of the skull through the vertex without intravenous contrast. COMPARISON:  MRI January 1921. FINDINGS: Brain: No evidence of acute large vascular territory infarction, hemorrhage, hydrocephalus, extra-axial collection or mass lesion/mass effect. Patchy white matter hypoattenuation, likely related to chronic microvascular ischemic disease. Mild generalized cerebral atrophy with ex vacuo ventricular dilation. Vascular: Calcific atherosclerosis. Skull: No acute fracture. Sinuses/Orbits: Left maxillary sinus retention cyst versus polyp. Otherwise, sinuses are clear. Unremarkable orbits. Other: Motion limited. IMPRESSION: No evidence of acute intracranial abnormality on this motion limited exam. Electronically Signed   By: Margaretha Sheffield MD   On: 12/06/2019 13:05   DG Chest Port 1 View  Result Date: 12/06/2019 CLINICAL DATA:  Chest pain, sepsis. EXAM: PORTABLE CHEST 1 VIEW COMPARISON:  December 05, 2019. FINDINGS: Stable cardiomediastinal silhouette. Increased left basilar opacity is noted concerning for pneumonia or atelectasis with associated pleural effusion. No pneumothorax is noted. Minimal right basilar subsegmental atelectasis is noted. Bony thorax is unremarkable. IMPRESSION: Increased left basilar opacity  is noted concerning for pneumonia or atelectasis with associated pleural effusion. Minimal right basilar subsegmental atelectasis is noted. Electronically Signed   By: Marijo Conception M.D.   On: 12/06/2019 12:56   DG Chest Portable 1 View  Result Date: 12/05/2019 CLINICAL DATA:  Chest pain EXAM: PORTABLE CHEST 1 VIEW COMPARISON:  10/09/2019 FINDINGS: Chronic mild interstitial prominence. Patchy density at the left lung base. Stable cardiomediastinal contours. No pneumothorax. IMPRESSION: Patchy left basilar atelectasis/consolidation with possible small pleural effusion. Electronically Signed   By: Macy Mis M.D.   On: 12/05/2019 13:05    DVT Prophylaxis -SCD /Eliquis AM Labs Ordered, also please review Full Orders  Family Communication: Admission, patients condition and plan of care including tests being ordered have been discussed with the patient and son who indicate understanding and agree with the plan   Code Status -partial code  Likely DC to home with home health  Condition   stable  Roxan Hockey M.D on 12/06/2019 at 4:08 PM Go to [email or web address] -  for contact info  [facility] - Office  [phone]

## 2019-12-06 NOTE — Progress Notes (Signed)
Pharmacy Antibiotic Note  [name] is a 86 y.o. male admitted on 12/06/2019 with unknown source of infection.  Pharmacy has been consulted for Vancomycin and Cefepime dosing.  Plan: Vancomycin 2000 mg IV x 1 dose. Vancomycin 1500 mg IV every 48 hours. Expected AUC 511. Cefepime 1000 mg IV every 24 hours. Monitor labs, c/s, and vanco level as indicated.  Height: 5\' 8"  (172.7 cm) Weight: 98.9 kg (218 lb) IBW/kg (Calculated) : 68.4  Temp (24hrs), Avg:100.6 F (38.1 C), Min:100.6 F (38.1 C), Max:100.6 F (38.1 C)  Recent Labs  Lab 12/05/19 1155 12/06/19 1144  WBC 16.6* 22.6*  CREATININE 2.84* 2.53*  LATICACIDVEN  --  1.8    Estimated Creatinine Clearance: 22.1 mL/min (A) (by C-G formula based on SCr of 2.53 mg/dL (H)).    Allergies  Allergen Reactions  . Iodinated Diagnostic Agents Other (See Comments)    caused Fever  . Dye Fdc Red [Red Dye] Hives    Tolerated red Docusate, Niferex    Antimicrobials this admission: Vanco 11/22 >>  Cefepime 11/22 >>    Microbiology results: 11/22 BCx: pending 11/22 UCx: pending   Thank you for allowing pharmacy to be a part of this patient's care.  Ramond Craver 12/06/2019 1:40 PM

## 2019-12-06 NOTE — Sepsis Progress Note (Signed)
[facility] is monitoring the sepsis protocol.

## 2019-12-06 NOTE — ED Notes (Signed)
Date and time results received: 12/06/19 1315   Test: troponin Critical Value: 280  Name of Provider Notified: Dr. Jeanell Sparrow  Orders Received? Or Actions Taken?: Orders Received - See Orders for details

## 2019-12-06 NOTE — ED Notes (Signed)
Pt's son reports pt is currently on Prednisone due to an "itch on his back" and his blood sugars have been running very high. BG above 600 yesterday so he was given additional Glipizide last night. BG was 311 this morning at home.

## 2019-12-06 NOTE — ED Provider Notes (Signed)
[facility] EMERGENCY DEPARTMENT Provider Note   CSN: [id number] Arrival date & time: 12/06/19  1054     History Chief Complaint  Patient presents with  . Altered Mental Status    GIFFORD BALLON is a 86 y.o. male. Level 5 caveat secondary to patient's altered mental status History obtained from son at bedside HPI 86 yo male ho chf, ckd, dm, hypertension, bilateral bka presents today with ams.  Patient seen yesterday for cp and discharged home.  Son states that he was in his usual mental status state yesterday.  He went to bed somewhat early last night.  He has had some increased lethargy and decreased responsiveness since awakening this morning.  Son brings him to the ED secondary to this.    Past Medical History:  Diagnosis Date  . Arthritis   . Borderline diabetic   . CHF (congestive heart failure) ([place])   . Chronic kidney disease   . Diabetes mellitus without complication ([place])   . Dysrhythmia   . Glaucoma   . Hyperlipidemia   . Hypertension   . Hypothyroidism   . Myocardial infarction ([place])   . PVD (peripheral vascular disease) ([place])   . Sleep apnea    Cpap ordered but doesnt use  . Urinary retention due to benign prostatic hyperplasia 12/28/2018    Patient Active Problem List   Diagnosis Date Noted  . AKI (acute kidney injury) ([place])   . Aortic valve stenosis   . Palliative care encounter   . NSTEMI (non-ST elevated myocardial infarction) ([place]) 10/04/2019  . Acute kidney injury superimposed on CKD ([place]) 10/04/2019  . Hypokalemia 10/04/2019  . Weakness generalized 04/05/2019  . S/P BKA (below knee amputation) unilateral, left ([place]) 02/15/2019  . Pressure injury of skin 02/03/2019  . TIA (transient ischemic attack) 02/03/2019  . Transient speech disturbance 02/02/2019  . Chronic indwelling Foley catheter 01/22/2019  . Postoperative wound infection 01/20/2019  . Normocytic anemia 01/18/2019  . Cellulitis 01/18/2019  . Infection of deep incisional surgical  site after procedure   . Abnormal urinalysis   . Labile blood glucose   . Urinary retention   . S/P bilateral BKA (below knee amputation) ([place])   . Acute blood loss anemia   . Urinary retention due to benign prostatic hyperplasia 12/28/2018  . Unilateral complete BKA, left, initial encounter ([place]) 12/25/2018  . Dehiscence of amputation stump ([place])   . History of transmetatarsal amputation of left foot ([place]) 12/09/2018  . Critical limb ischemia with history of revascularization of same extremity ([place])   . Gangrene of left foot ([place])   . Diabetic ulcer of toe of left foot associated with type 2 diabetes mellitus ([place])   . Cellulitis of left lower extremity   . Toe infection 11/18/2018  . Peripheral edema   . Acute on chronic diastolic CHF (congestive heart failure) ([place]) 11/09/2018  . S/P BKA (below knee amputation) unilateral, right ([place]) 10/05/2018  . Hyponatremia   . Essential hypertension   . Postoperative pain   . Pain of left heel   . Unable to maintain weight-bearing   . Type 2 diabetes mellitus with diabetic peripheral angiopathy with gangrene ([place])   . Anemia due to acute blood loss   . Chronic kidney disease, stage 3a   . Acquired absence of right leg below knee ([place]) 09/07/2018  . Gangrene of right foot ([place])   . DNR (do not resuscitate) discussion   . Goals of care, counseling/discussion   . Palliative care by  specialist   . Type 2 diabetes mellitus with vascular disease ([place])   . Severe protein-calorie malnutrition ([place])   . Ischemic foot 08/20/2018  . Critical lower limb ischemia ([place]) 08/03/2018  . Type 2 diabetes mellitus with foot ulcer, without long-term current use of insulin ([place])   . Gastroesophageal reflux disease   . Cellulitis of great toe of right foot 05/29/2018  . Atrial fibrillation, chronic   . Chest pain 07/25/2017  . PAF (paroxysmal atrial fibrillation) ([place]) 07/25/2017  . BPH (benign prostatic hyperplasia) 07/25/2017  . Shortness of breath  05/11/2017  . Hypothyroidism 05/11/2017  . Chronic diastolic CHF (congestive heart failure) ([place]) 05/11/2017  . Moderate aortic stenosis 06/12/2016  . RBBB 06/12/2016  . Peripheral vascular disease ([place]) 08/04/2012  . Essential hypertension, benign 08/04/2012  . Hyperlipidemia 08/04/2012  . Dizziness 08/04/2012    Past Surgical History:  Procedure Laterality Date  . ABDOMINAL AORTOGRAM W/LOWER EXTREMITY N/A 07/27/2018   Procedure: ABDOMINAL AORTOGRAM W/LOWER EXTREMITY;  Surgeon: Lorretta Harp, MD;  Location: [place] [facility];  Service: Cardiovascular;  Laterality: N/A;  . AMPUTATION Right 08/28/2018   Procedure: RIGHT BELOW KNEE AMPUTATION;  Surgeon: [name], MD;  Location: [place];  Service: Orthopedics;  Laterality: Right;  . AMPUTATION Left 12/02/2018   Procedure: LEFT TRANSMETATARSAL AMPUTATION AND NEGATIVE PRESSURE WOUND VAC PLACEMENT;  Surgeon: [name], MD;  Location: [place];  Service: Orthopedics;  Laterality: Left;  . AMPUTATION Left 12/18/2018   Procedure: LEFT BELOW KNEE AMPUTATION;  Surgeon: [name], MD;  Location: [place];  Service: Orthopedics;  Laterality: Left;  . APPENDECTOMY  1943  . BELOW KNEE LEG AMPUTATION Left 12/18/2018  . CATARACT EXTRACTION  2012   x2  . COLONOSCOPY N/A 11/01/2014   Procedure: COLONOSCOPY;  Surgeon: Aviva Signs, MD;  [facility];  Service: Gastroenterology;  Laterality: N/A;  . ESOPHAGOGASTRODUODENOSCOPY N/A 11/01/2014   Procedure: ESOPHAGOGASTRODUODENOSCOPY (EGD);  Surgeon: Aviva Signs, MD;  [facility];  Service: Gastroenterology;  Laterality: N/A;  . HERNIA REPAIR Left   . INGUINAL HERNIA REPAIR Right 03/01/2013   Procedure: RIGHT INGUINAL HERNIORRHAPHY;  Surgeon: Jamesetta So, MD;  Location: [facility];  Service: General;  Laterality: Right;  . INSERTION OF MESH Right 03/01/2013   Procedure: INSERTION OF MESH;  Surgeon: Jamesetta So, MD;  [facility];  Service: General;  Laterality: Right;   . LOWER EXTREMITY ANGIOGRAPHY Right 08/25/2018   Procedure: LOWER EXTREMITY ANGIOGRAPHY;  Surgeon: Wellington Hampshire, MD;  Location: [place] [facility];  Service: Cardiovascular;  Laterality: Right;  . LOWER EXTREMITY ANGIOGRAPHY N/A 11/25/2018   Procedure: LOWER EXTREMITY ANGIOGRAPHY;  Surgeon: Wellington Hampshire, MD;  Location: [place] [facility];  Service: Cardiovascular;  Laterality: N/A;  . NM MYOCAR PERF WALL MOTION  06/05/2010   no significant ischemia  . PERIPHERAL VASCULAR ATHERECTOMY Right 08/03/2018   Procedure: PERIPHERAL VASCULAR ATHERECTOMY;  Surgeon: Lorretta Harp, MD;  Location: [place] [facility];  Service: Cardiovascular;  Laterality: Right;  . PERIPHERAL VASCULAR ATHERECTOMY Left 11/23/2018   Procedure: PERIPHERAL VASCULAR ATHERECTOMY;  Surgeon: Lorretta Harp, MD;  Location: [place] [facility];  Service: Cardiovascular;  Laterality: Left;  sfa with dc balloon  . PERIPHERAL VASCULAR BALLOON ANGIOPLASTY Left 11/25/2018   Procedure: PERIPHERAL VASCULAR BALLOON ANGIOPLASTY;  Surgeon: Wellington Hampshire, MD;  Location: [place] [facility];  Service: Cardiovascular;  Laterality: Left;  popliteal  . PERIPHERAL VASCULAR INTERVENTION Left 11/25/2018  Procedure: PERIPHERAL VASCULAR INTERVENTION;  Surgeon: Wellington Hampshire, MD;  Location: [place] [facility];  Service: Cardiovascular;  Laterality: Left;  tibial peroneal trunk and peroneal stents   . stent  06/14/2010   left leg  . STUMP REVISION Left 01/20/2019   Procedure: REVISION LEFT BELOW KNEE AMPUTATION;  Surgeon: [name], MD;  Location: [place];  Service: Orthopedics;  Laterality: Left;  . US ECHOCARDIOGRAPHY  01/21/2006   moderate mitral annular ca+, mild MR, AOV moderately sclerotic.       Family History  Problem Relation Age of Onset  . Cancer Mother   . Heart attack Father   . Stroke Father   . Heart attack Brother   . Heart attack Brother     Social History   Tobacco Use  . Smoking status: Former Smoker     Quit date: 01/13/1965    Years since quitting: 54.9  . Smokeless tobacco: Former Systems developer    Types: Secondary school teacher  . Vaping Use: Never used  Substance Use Topics  . Alcohol use: No  . Drug use: No    Home Medications Prior to Admission medications   Medication Sig Start Date End Date Taking? Authorizing Provider  acetaminophen (TYLENOL) 500 MG tablet Take 500 mg by mouth every 6 (six) hours as needed for headache (pain).    [provider]  amLODipine (NORVASC) 10 MG tablet Take 1 tablet (10 mg total) by mouth daily. 11/16/19 02/14/20  Hilty, Nadean Corwin, MD  apixaban (ELIQUIS) 5 MG TABS tablet Take 2.5 mg by mouth 2 (two) times daily.     [provider]  Ascorbic Acid (VITAMIN C) 1000 MG tablet Take 1,000 mg by mouth daily.     [provider]  atorvastatin (LIPITOR) 40 MG tablet Take 1 tablet (40 mg total) by mouth every evening. 10/25/19   Barrett, Evelene Croon, PA-C  carvedilol (COREG) 3.125 MG tablet Take 1 tablet (3.125 mg total) by mouth 2 (two) times daily. 10/25/19 01/23/20  Barrett, Evelene Croon, PA-C  Cholecalciferol (VITAMIN D-3) 125 MCG (5000 UT) TABS Take 5,000 Units by mouth every evening.    [provider]  clopidogrel (PLAVIX) 75 MG tablet Take 1 tablet (75 mg total) by mouth daily with breakfast. 12/03/19   Hilty, Nadean Corwin, MD  dorzolamide-timolol (COSOPT) 22.3-6.8 MG/ML ophthalmic solution Place 1 drop into both eyes 2 (two) times daily.     [provider]  famotidine (PEPCID) 20 MG tablet Take 1 tablet (20 mg total) by mouth daily. 11/12/18   Johnson, Clanford L, MD  ferrous sulfate 325 (65 FE) MG tablet Take 325 mg by mouth 2 (two) times daily with a meal.    [provider]  finasteride (PROSCAR) 5 MG tablet Take 1 tablet (5 mg total) by mouth every evening. 09/22/18   Angiulli, Lavon Paganini, PA-C  furosemide (LASIX) 40 MG tablet Take 20-40 mg by mouth See admin instructions. Take one tablet (40 mg) by mouth every morning and  1/2 tablet (20 mg) every evening    [provider]  glipiZIDE (GLUCOTROL) 5 MG tablet Take 5 mg by mouth 2 (two) times daily.     [provider]  latanoprost (XALATAN) 0.005 % ophthalmic solution Place 1 drop into both eyes at bedtime. 09/30/19   [provider]  levothyroxine (SYNTHROID) 75 MCG tablet Take 75 mcg by mouth daily before breakfast.  02/12/19   [provider]  Sisters (Freedom MSM  FORMULA PO) Take 1 tablet by mouth 2 (two) times daily.    [provider]  [facility] ULTRA test strip 1 each 2 (two) times daily. 04/15/19   [provider]  polyethylene glycol (MIRALAX) 17 g packet Take 17 g by mouth daily as needed for moderate constipation. 12/09/18   Mercy Riding, MD  Potassium Gluconate 550 (90 K) MG TABS Take 90 mg by mouth 2 (two) times daily.    [provider]  pyridoxine (B-6) 100 MG tablet Take 100 mg by mouth daily.    [provider]  traMADol (ULTRAM) 50 MG tablet Take 1 tablet (50 mg total) by mouth every 6 (six) hours as needed. Patient taking differently: Take 50 mg by mouth every 6 (six) hours as needed (pain).  02/15/19   Lovorn, Jinny Blossom, MD  vitamin E 400 UNIT capsule Take 400 Units by mouth daily.    [provider]    Allergies    Iodinated diagnostic agents and Dye fdc red [red dye]  Review of Systems   Review of Systems  All other systems reviewed and are negative.   Physical Exam Updated Vital Signs BP (!) 181/84   Pulse 74   Temp (!) 100.6 F (38.1 C) (Rectal)   Resp 19   Ht 1.727 m (5\' 8" )   Wt 98.9 kg   SpO2 92%   BMI 33.15 kg/m   Physical Exam Vitals and nursing note reviewed.  Constitutional:      General: He is not in acute distress.    Appearance: Normal appearance.     Comments: Patient resting with eyes closed but does respond to his name and answers questions Tactile warmth on palpation  HENT:     Head: Normocephalic.     Right  Ear: External ear normal.     Left Ear: External ear normal.     Nose: Nose normal.     Mouth/Throat:     Pharynx: Oropharynx is clear.  Eyes:     Pupils: Pupils are equal, round, and reactive to light.  Cardiovascular:     Rate and Rhythm: Normal rate and regular rhythm.     Pulses: Normal pulses.  Pulmonary:     Effort: Pulmonary effort is normal.     Comments: Bruising noted right posterolateral chest wall Abdominal:     General: Abdomen is flat.  Genitourinary:    Penis: Normal.      Comments: Foley catheter in place Musculoskeletal:        General: Normal range of motion.     Cervical back: Normal range of motion.     Comments: Bilateral bka   Skin:    General: Skin is warm.     Capillary Refill: Capillary refill takes less than 2 seconds.     Comments: Some skin breakdown bilateral buttocks  Neurological:     Comments: Patient voices name, location , but not year     ED Results / Procedures / Treatments   Labs (all labs ordered are listed, but only abnormal results are displayed) Labs Reviewed  CBC WITH DIFFERENTIAL/PLATELET - Abnormal; Notable for the following components:      Result Value   WBC 22.6 (*)    RBC 3.89 (*)    Hemoglobin 12.7 (*)    HCT 37.1 (*)    Neutro Abs 17.8 (*)    Monocytes Absolute 1.8 (*)    Abs Immature Granulocytes 0.16 (*)    All other components within normal limits  PROTIME-INR - Abnormal; Notable for the following components:   Prothrombin Time 15.6 (*)    INR 1.3 (*)    All other components within normal limits  APTT - Abnormal; Notable for the following components:   aPTT 48 (*)    All other components within normal limits  CBG MONITORING, ED - Abnormal; Notable for the following components:   Glucose-Capillary 178 (*)    All other components within normal limits  URINE CULTURE  CULTURE, BLOOD (ROUTINE X 2)  CULTURE, BLOOD (ROUTINE X 2)  RESP PANEL BY RT-PCR (FLU A&B, COVID) ARPGX2  LACTIC ACID, PLASMA  LACTIC ACID,  PLASMA  COMPREHENSIVE METABOLIC PANEL  URINALYSIS, ROUTINE W REFLEX MICROSCOPIC  TROPONIN I (HIGH SENSITIVITY)    EKG None  Radiology CT Head Wo Contrast  Result Date: 12/06/2019 CLINICAL DATA:  Mental status change. EXAM: CT HEAD WITHOUT CONTRAST TECHNIQUE: Contiguous axial images were obtained from the base of the skull through the vertex without intravenous contrast. COMPARISON:  MRI January 1921. FINDINGS: Brain: No evidence of acute large vascular territory infarction, hemorrhage, hydrocephalus, extra-axial collection or mass lesion/mass effect. Patchy white matter hypoattenuation, likely related to chronic microvascular ischemic disease. Mild generalized cerebral atrophy with ex vacuo ventricular dilation. Vascular: Calcific atherosclerosis. Skull: No acute fracture. Sinuses/Orbits: Left maxillary sinus retention cyst versus polyp. Otherwise, sinuses are clear. Unremarkable orbits. Other: Motion limited. IMPRESSION: No evidence of acute intracranial abnormality on this motion limited exam. Electronically Signed   By: Margaretha Sheffield MD   On: 12/06/2019 13:05   DG Chest Port 1 View  Result Date: 12/06/2019 CLINICAL DATA:  Chest pain, sepsis. EXAM: PORTABLE CHEST 1 VIEW COMPARISON:  December 05, 2019. FINDINGS: Stable cardiomediastinal silhouette. Increased left basilar opacity is noted concerning for pneumonia or atelectasis with associated pleural effusion. No pneumothorax is noted. Minimal right basilar subsegmental atelectasis is noted. Bony thorax is unremarkable. IMPRESSION: Increased left basilar opacity is noted concerning for pneumonia or atelectasis with associated pleural effusion. Minimal right basilar subsegmental atelectasis is noted. Electronically Signed   By: Marijo Conception M.D.   On: 12/06/2019 12:56   DG Chest Portable 1 View  Result Date: 12/05/2019 CLINICAL DATA:  Chest pain EXAM: PORTABLE CHEST 1 VIEW COMPARISON:  10/09/2019 FINDINGS: Chronic mild interstitial  prominence. Patchy density at the left lung base. Stable cardiomediastinal contours. No pneumothorax. IMPRESSION: Patchy left basilar atelectasis/consolidation with possible small pleural effusion. Electronically Signed   By: Macy Mis M.D.   On: 12/05/2019 13:05    Procedures Procedures (including critical care time)  Medications Ordered in ED Medications  lactated ringers bolus 500 mL (500 mLs Intravenous New Bag/Given 12/06/19 1221)    ED Course  I have reviewed the triage vital signs and the nursing notes.  Pertinent labs & imaging results that were available during my care of the patient were reviewed by me and considered in my medical decision making (see chart for details).    MDM Rules/Calculators/A&P                          AMS suspect secondary to infection.  CT head pending as patient altered and on eliquis, but no head trauma known, no signs of head trauma. No focality noted on exam. Blood pressure elevated Febrile to 100.6 Lactic normal- doubt sepis Will initiate broad spectrum abx CXR- concerning for increasing left basilar infiltrate Urinalysis pending Discussed with Dr. Denton Brick who will see for admission   Final Clinical Impression(s) /  ED Diagnoses Final diagnoses:  Community acquired pneumonia of left lower lobe of lung    Rx / DC Orders ED Discharge Orders    None       Pattricia Boss, MD 12/06/19 1334

## 2019-12-07 ENCOUNTER — Encounter (HOSPITAL_COMMUNITY): Payer: Self-pay | Admitting: Family Medicine

## 2019-12-07 DIAGNOSIS — I350 Nonrheumatic aortic (valve) stenosis: Secondary | ICD-10-CM

## 2019-12-07 DIAGNOSIS — A419 Sepsis, unspecified organism: Secondary | ICD-10-CM | POA: Diagnosis not present

## 2019-12-07 DIAGNOSIS — G9341 Metabolic encephalopathy: Secondary | ICD-10-CM

## 2019-12-07 DIAGNOSIS — I482 Chronic atrial fibrillation, unspecified: Secondary | ICD-10-CM | POA: Diagnosis not present

## 2019-12-07 DIAGNOSIS — Z515 Encounter for palliative care: Secondary | ICD-10-CM | POA: Diagnosis not present

## 2019-12-07 DIAGNOSIS — Z7189 Other specified counseling: Secondary | ICD-10-CM

## 2019-12-07 DIAGNOSIS — N1831 Chronic kidney disease, stage 3a: Secondary | ICD-10-CM

## 2019-12-07 LAB — BASIC METABOLIC PANEL
Anion gap: 11 (ref 5–15)
BUN: 47 mg/dL — ABNORMAL HIGH (ref 8–23)
CO2: 25 mmol/L (ref 22–32)
Calcium: 8.4 mg/dL — ABNORMAL LOW (ref 8.9–10.3)
Chloride: 98 mmol/L (ref 98–111)
Creatinine, Ser: 2.41 mg/dL — ABNORMAL HIGH (ref 0.61–1.24)
GFR, Estimated: 25 mL/min — ABNORMAL LOW (ref >60)
Glucose, Bld: 184 mg/dL — ABNORMAL HIGH (ref 70–99)
Potassium: 3.3 mmol/L — ABNORMAL LOW (ref 3.5–5.1)
Sodium: 134 mmol/L — ABNORMAL LOW (ref 135–145)

## 2019-12-07 LAB — GLUCOSE, CAPILLARY
Glucose-Capillary: 156 mg/dL — ABNORMAL HIGH (ref 70–99)
Glucose-Capillary: 232 mg/dL — ABNORMAL HIGH (ref 70–99)

## 2019-12-07 LAB — CBC
HCT: 32.4 % — ABNORMAL LOW (ref 39.0–52.0)
Hemoglobin: 10.9 g/dL — ABNORMAL LOW (ref 13.0–17.0)
MCH: 32.2 pg (ref 26.0–34.0)
MCHC: 33.6 g/dL (ref 30.0–36.0)
MCV: 95.9 fL (ref 80.0–100.0)
Platelets: 191 10*3/uL (ref 150–400)
RBC: 3.38 MIL/uL — ABNORMAL LOW (ref 4.22–5.81)
RDW: 14.6 % (ref 11.5–15.5)
WBC: 15.9 10*3/uL — ABNORMAL HIGH (ref 4.0–10.5)
nRBC: 0 % (ref 0.0–0.2)

## 2019-12-07 LAB — MRSA PCR SCREENING: MRSA by PCR: POSITIVE — AB

## 2019-12-07 MED ORDER — POTASSIUM CHLORIDE CRYS ER 20 MEQ PO TBCR
40.0000 meq | EXTENDED_RELEASE_TABLET | ORAL | Status: AC
  Administered 2019-12-07 (×2): 40 meq via ORAL
  Filled 2019-12-07 (×2): qty 2

## 2019-12-07 MED ORDER — CHLORHEXIDINE GLUCONATE CLOTH 2 % EX PADS
6.0000 | MEDICATED_PAD | Freq: Every day | CUTANEOUS | Status: DC
  Administered 2019-12-07 – 2019-12-13 (×7): 6 via TOPICAL

## 2019-12-07 MED ORDER — MUPIROCIN 2 % EX OINT
1.0000 "application " | TOPICAL_OINTMENT | Freq: Two times a day (BID) | CUTANEOUS | Status: AC
  Administered 2019-12-07 – 2019-12-11 (×9): 1 via NASAL
  Filled 2019-12-07 (×2): qty 22

## 2019-12-07 NOTE — Progress Notes (Signed)
Patient Demographics:    [name], is a 86 y.o. male, DOB - 1927/04/08, VXB:[id number]  Admit date - 12/06/2019   Admitting Physician Carlito Bogert Denton Brick, MD  Outpatient Primary MD for the patient is Redmond School, MD  LOS - 1   Chief Complaint  Patient presents with  . Altered Mental Status        Subjective:    Thurnell Lose today has no fevers, no emesis,  No chest pain,   -Patient's daughter at bedside, -Oral intake is fair -More awake, appears more coherent  Assessment  & Plan :    Principal Problem:   Sepsis due to PNA Active Problems:   PNA (pneumonia)   Acute metabolic encephalopathy   Atrial fibrillation, chronic   Chronic indwelling Foley catheter   Moderate aortic stenosis   Hypothyroidism   Chronic diastolic CHF (congestive heart failure) (HCC)   PAF (paroxysmal atrial fibrillation) (HCC)   BPH (benign prostatic hyperplasia)   Type 2 diabetes mellitus with vascular disease (HCC)   Chronic kidney disease, stage 3a   Essential hypertension   S/P bilateral BKA (below knee amputation) ([place])   Aortic valve stenosis   Brief Summary:- 86 y.o. male with a history of DM, HTN, HLD, PAD s/p bilat BKA, AS, PAF, D-CHF, CKD IV, OSA not on CPAP, RBBB, bradycardia, hypothyroidism., CAD (NSTEMI 10/2019)---admitted on 12/06/19 with sepsis due to Pneumonia  A/p 1)Sepsis secondary to Pneumonia with acute metabolic Encephalopathy--- cannot exclude superimposed UTI -CT head negative, Covid negative -Patient met sepsis criteria on admission with leukocytosis, fevers and tachypnea -MRSA PCR is Positive -WBC is Down to 15.9 from 22.6 -No fevers, blood and urine cultures NGTD  c/n Vancomycin and Cefepime   2)CAD--recent NSTEMI in October 2021--- troponins are at his baseline, patient is not a candidate for intervention due to CKD stage IV -Currently does not appear to have ACS -Continue  Lipitor, Coreg and Plavix  3)HFpEF--patient has history of chronic diastolic dysfunction CHF, EF by echo from August 2021 is  61 to 60% -No evidence of significant CHF exacerbation at this time -- 4)CKD IV--creatinine currently around 2.41 which is close to patient's baseline, bicarb is 25, monitor closely -renally adjust medications, avoid nephrotoxic agents / dehydration  / hypotension  5)Hypothyroidism--stable, continue levothyroxine 75 mcg daily  6)BPH with LUTs-chronic renal retention status post Foley catheter placement -Continue chronic indwelling Foley, continue Proscar  7)DM2-A1c is 8.5 reflecting uncontrolled DM with hyperglycemia PTA -Hold glipizide Use Novolog/Humalog Sliding scale insulin with Accu-Cheks/Fingersticks as ordered  7)Chronic atrial fibrillation--- continue apixaban for stroke prophylaxis, continue Coreg for rate control  8)HTN-continue amlodipine and Coreg for BP control   9)Social/Ethics--plan of care, CODE STATUS and advanced directive discussed with patient and patient's son [name] at bedside, they request No intubation, patient is partial code CPR is okay pressors and BiPAP are okay  10) acute on chronic Anemia--- hemoglobin down to 10.9 from 12.7 after IV fluids suspect some component of hemodilution -Patient baseline hemoglobin usually is around 11 -No evidence of ongoing bleeding, monitor closely  11)Hyponatremia--Sodium is up to 134 from 130 suspect dehydration related, c/n IV fluids as ordered  Disposition/Need for in-Hospital Stay- patient unable to be discharged at this time due to --sepsis requiring IV antibiotics and  IV fluids pending urine and blood cultures  Status is: Inpatient  Remains inpatient appropriate because:sepsis requiring IV antibiotics and IV fluids-  Dispo: The patient is from: Home  Anticipated d/c is to: Home  Barriers: Not Clinically Stable- -sepsis requiring IV antibiotics and IV fluids pending  urine and blood cultures  Code Status :  Partial Code  Family Communication:    (patient is alert, awake and coherent)  --Discussed with Daughter [name] at bedside  Consults  :  Palliative  DVT Prophylaxis  : Apixaban -  Lab Results  Component Value Date   PLT 191 12/07/2019   Inpatient Medications  Scheduled Meds: . amLODipine  10 mg Oral Daily  . apixaban  2.5 mg Oral BID  . vitamin C  1,000 mg Oral Daily  . atorvastatin  40 mg Oral QPM  . carvedilol  3.125 mg Oral BID  . Chlorhexidine Gluconate Cloth  6 each Topical Daily  . cholecalciferol  5,000 Units Oral QPM  . clopidogrel  75 mg Oral Q breakfast  . dorzolamide-timolol  1 drop Both Eyes BID  . famotidine  20 mg Oral Daily  . ferrous sulfate  325 mg Oral BID WC  . finasteride  5 mg Oral QPM  . insulin aspart  0-5 Units Subcutaneous QHS  . insulin aspart  0-9 Units Subcutaneous TID WC  . latanoprost  1 drop Both Eyes QHS  . levothyroxine  75 mcg Oral QAC breakfast  . mupirocin ointment  1 application Nasal BID  . pyridOXINE  100 mg Oral Daily  . sodium chloride flush  3 mL Intravenous Q12H  . sodium chloride flush  3 mL Intravenous Q12H  . vitamin E  400 Units Oral Daily   Continuous Infusions: . sodium chloride    . sodium chloride 100 mL/hr at 12/06/19 1540  . ceFEPime (MAXIPIME) IV 1 g (12/07/19 1357)  . [START ON 12/08/2019] vancomycin     PRN Meds:.sodium chloride, acetaminophen **OR** acetaminophen, bisacodyl, ondansetron **OR** ondansetron (ZOFRAN) IV, polyethylene glycol, sodium chloride flush, traZODone   Anti-infectives (From admission, onward)   Start     Dose/Rate Route Frequency Ordered Stop   12/08/19 1400  vancomycin (VANCOREADY) IVPB 1500 mg/300 mL        1,500 mg 150 mL/hr over 120 Minutes Intravenous Every 48 hours 12/06/19 1339     12/07/19 1400  ceFEPIme (MAXIPIME) 1 g in sodium chloride 0.9 % 100 mL IVPB        1 g 200 mL/hr over 30 Minutes Intravenous Every 24 hours 12/06/19 1315      12/06/19 1315  ceFEPIme (MAXIPIME) 2 g in sodium chloride 0.9 % 100 mL IVPB        2 g 200 mL/hr over 30 Minutes Intravenous  Once 12/06/19 1305 12/06/19 1402   12/06/19 1315  vancomycin (VANCOCIN) IVPB 1000 mg/200 mL premix  Status:  Discontinued        1,000 mg 200 mL/hr over 60 Minutes Intravenous  Once 12/06/19 1305 12/06/19 1309   12/06/19 1315  vancomycin (VANCOREADY) IVPB 2000 mg/400 mL        2,000 mg 200 mL/hr over 120 Minutes Intravenous  Once 12/06/19 1309 12/06/19 1619        Objective:   Vitals:   12/07/19 0434 12/07/19 0929 12/07/19 0938 12/07/19 1040  BP: (!) 109/51 (!) 141/53    Pulse: 73 (!) 57 (!) 56   Resp: 17 16    Temp: 98.2 F (36.8 C) 98.8 F (  37.1 C)    TempSrc: Oral Oral    SpO2: 98% 93%  94%  Weight:      Height:        Wt Readings from Last 3 Encounters:  12/06/19 103.3 kg  12/05/19 97 kg  11/23/19 97.5 kg     Intake/Output Summary (Last 24 hours) at 12/07/2019 1527 Last data filed at 12/07/2019 1300 Gross per 24 hour  Intake 243 ml  Output 1500 ml  Net -1257 ml    Physical Exam  Physical Examination: General appearance - chronically ill appearing, and in no distress Mental status awake, oriented x2  eyes - sclera anicteric Neck - supple, no JVD elevation , Chest -diminished in bases, scattered rhonchi  heart - S1 and S2 normal, irregular  Abdomen - soft, nontender, nondistended, +Bs, no CVA area tenderness Neurological -more alert, more coherent,  Skin - warm, dry MSK-bilateral BKA GU-chronic indwelling Foley   Data Review:   Micro Results Recent Results (from the past 240 hour(s))  Blood culture (routine x 2)     Status: None (Preliminary result)   Collection Time: 12/06/19 11:45 AM   Specimen: BLOOD  Result Value Ref Range Status   Specimen Description BLOOD RIGHT ANTECUBITAL  Final   Special Requests   Final    BOTTLES DRAWN AEROBIC AND ANAEROBIC Blood Culture adequate volume   Culture   Final    NO GROWTH < 24  HOURS Performed at [facility], [address], [place], [place] [address]    Report Status PENDING  Incomplete  Blood culture (routine x 2)     Status: None (Preliminary result)   Collection Time: 12/06/19 11:50 AM   Specimen: BLOOD  Result Value Ref Range Status   Specimen Description BLOOD RIGHT ARM  Final   Special Requests   Final    BOTTLES DRAWN AEROBIC AND ANAEROBIC Blood Culture adequate volume   Culture   Final    NO GROWTH < 24 HOURS Performed at [facility], [address] [place], [place] [address]    Report Status PENDING  Incomplete  Resp Panel by RT-PCR (Flu A&B, Covid) Nasopharyngeal Swab     Status: None   Collection Time: 12/06/19  2:07 PM   Specimen: Nasopharyngeal Swab; Nasopharyngeal(NP) swabs in vial transport medium  Result Value Ref Range Status   SARS Coronavirus 2 by RT PCR NEGATIVE NEGATIVE Final    Comment: (NOTE) SARS-CoV-2 target nucleic acids are NOT DETECTED.  The SARS-CoV-2 RNA is generally detectable in upper respiratory specimens during the acute phase of infection. The lowest concentration of SARS-CoV-2 viral copies this assay can detect is 138 copies/mL. A negative result does not preclude SARS-Cov-2 infection and should not be used as the sole basis for treatment or other patient management decisions. A negative result may occur with  improper specimen collection/handling, submission of specimen other than nasopharyngeal swab, presence of viral mutation(s) within the areas targeted by this assay, and inadequate number of viral copies(<138 copies/mL). A negative result must be combined with clinical observations, patient history, and epidemiological information. The expected result is Negative.  Fact Sheet for Patients:  EntrepreneurPulse.com.au  Fact Sheet for Healthcare Providers:  IncredibleEmployment.be  This test is no t yet approved or cleared by the [place] FDA and  has been authorized  for detection and/or diagnosis of SARS-CoV-2 by FDA under an Emergency Use Authorization (EUA). This EUA will remain  in effect (meaning this test can be used) for the duration of the COVID-19  declaration under Section 564(b)(1) of the Act, 21 U.S.C.section 360bbb-3(b)(1), unless the authorization is terminated  or revoked sooner.       Influenza A by PCR NEGATIVE NEGATIVE Final   Influenza B by PCR NEGATIVE NEGATIVE Final    Comment: (NOTE) The Xpert Xpress SARS-CoV-2/FLU/RSV plus assay is intended as an aid in the diagnosis of influenza from Nasopharyngeal swab specimens and should not be used as a sole basis for treatment. Nasal washings and aspirates are unacceptable for Xpert Xpress SARS-CoV-2/FLU/RSV testing.  Fact Sheet for Patients: EntrepreneurPulse.com.au  Fact Sheet for Healthcare Providers: IncredibleEmployment.be  This test is not yet approved or cleared by the [place] FDA and has been authorized for detection and/or diagnosis of SARS-CoV-2 by FDA under an Emergency Use Authorization (EUA). This EUA will remain in effect (meaning this test can be used) for the duration of the COVID-19 declaration under Section 564(b)(1) of the Act, 21 U.S.C. section 360bbb-3(b)(1), unless the authorization is terminated or revoked.  Performed at [facility], [address], [place], [place] [address]   MRSA PCR Screening     Status: Abnormal   Collection Time: 12/07/19  8:31 AM   Specimen: Nasal Mucosa; Nasopharyngeal  Result Value Ref Range Status   MRSA by PCR POSITIVE (A) NEGATIVE Final    Comment:        The GeneXpert MRSA Assay (FDA approved for NASAL specimens only), is one component of a comprehensive MRSA colonization surveillance program. It is not intended to diagnose MRSA infection nor to guide or monitor treatment for MRSA infections. RESULT CALLED TO, READ BACK BY AND VERIFIED WITH: DEAN,T AT 1140 ON 12/07/19 BY  HUFFINES,S. Performed at [facility], [address] [place], [place] [address]     Radiology Reports CT Head Wo Contrast  Result Date: 12/06/2019 CLINICAL DATA:  Mental status change. EXAM: CT HEAD WITHOUT CONTRAST TECHNIQUE: Contiguous axial images were obtained from the base of the skull through the vertex without intravenous contrast. COMPARISON:  MRI January 1921. FINDINGS: Brain: No evidence of acute large vascular territory infarction, hemorrhage, hydrocephalus, extra-axial collection or mass lesion/mass effect. Patchy white matter hypoattenuation, likely related to chronic microvascular ischemic disease. Mild generalized cerebral atrophy with ex vacuo ventricular dilation. Vascular: Calcific atherosclerosis. Skull: No acute fracture. Sinuses/Orbits: Left maxillary sinus retention cyst versus polyp. Otherwise, sinuses are clear. Unremarkable orbits. Other: Motion limited. IMPRESSION: No evidence of acute intracranial abnormality on this motion limited exam. Electronically Signed   By: Margaretha Sheffield MD   On: 12/06/2019 13:05   DG Chest Port 1 View  Result Date: 12/06/2019 CLINICAL DATA:  Chest pain, sepsis. EXAM: PORTABLE CHEST 1 VIEW COMPARISON:  December 05, 2019. FINDINGS: Stable cardiomediastinal silhouette. Increased left basilar opacity is noted concerning for pneumonia or atelectasis with associated pleural effusion. No pneumothorax is noted. Minimal right basilar subsegmental atelectasis is noted. Bony thorax is unremarkable. IMPRESSION: Increased left basilar opacity is noted concerning for pneumonia or atelectasis with associated pleural effusion. Minimal right basilar subsegmental atelectasis is noted. Electronically Signed   By: Marijo Conception M.D.   On: 12/06/2019 12:56   DG Chest Portable 1 View  Result Date: 12/05/2019 CLINICAL DATA:  Chest pain EXAM: PORTABLE CHEST 1 VIEW COMPARISON:  10/09/2019 FINDINGS: Chronic mild interstitial prominence. Patchy density at the left  lung base. Stable cardiomediastinal contours. No pneumothorax. IMPRESSION: Patchy left basilar atelectasis/consolidation with possible small pleural effusion. Electronically Signed   By: Macy Mis M.D.   On: 12/05/2019 13:05  CBC Recent Labs  Lab 12/05/19 1155 12/06/19 1144 12/07/19 0515  WBC 16.6* 22.6* 15.9*  HGB 12.5* 12.7* 10.9*  HCT 36.7* 37.1* 32.4*  PLT 233 220 191  MCV 95.8 95.4 95.9  MCH 32.6 32.6 32.2  MCHC 34.1 34.2 33.6  RDW 14.4 14.6 14.6  LYMPHSABS  --  2.6  --   MONOABS  --  1.8*  --   EOSABS  --  0.3  --   BASOSABS  --  0.1  --     Chemistries  Recent Labs  Lab 12/05/19 1155 12/06/19 1144 12/07/19 0515  NA 130* 130* 134*  K 3.8 3.6 3.3*  CL 92* 94* 98  CO2 _0 GLUCOSE 443* 192* 184*  BUN 48* 49* 47*  CREATININE 2.84* 2.53* 2.41*  CALCIUM 8.9 8.9 8.4*  AST  --  26  --   ALT  --  23  --   ALKPHOS  --  48  --   BILITOT  --  1.0  --    ------------------------------------------------------------------------------------------------------------------ No results for input(s): CHOL, HDL, LDLCALC, TRIG, CHOLHDL, LDLDIRECT in the last 72 hours.  Lab Results  Component Value Date   HGBA1C 8.5 (H) 10/05/2019   ------------------------------------------------------------------------------------------------------------------ No results for input(s): TSH, T4TOTAL, T3FREE, THYROIDAB in the last 72 hours.  Invalid input(s): FREET3 ------------------------------------------------------------------------------------------------------------------ No results for input(s): VITAMINB12, FOLATE, FERRITIN, TIBC, IRON, RETICCTPCT in the last 72 hours.  Coagulation profile Recent Labs  Lab 12/06/19 1144  INR 1.3*    No results for input(s): DDIMER in the last 72 hours.  Cardiac Enzymes No results for input(s): CKMB, TROPONINI, MYOGLOBIN in the last 168 hours.  Invalid input(s):  CK ------------------------------------------------------------------------------------------------------------------    Component Value Date/Time   BNP 249.1 (H) 12/09/2018 3007     Roxan Hockey M.D on 12/07/2019 at 3:27 PM  Go to [email or web address] - for contact info  [facility] - Office  [phone]

## 2019-12-07 NOTE — Consult Note (Signed)
Consultation Note Date: 12/07/2019   Patient Name: [name]  DOB: 02/04/1927  MRN: [id number]  Age / Sex: 86 y.o., male  PCP: Redmond School, MD Referring Physician: Roxan Hockey, MD  Reason for Consultation: Establishing goals of care  HPI/Patient Profile: 86 y.o. male  with past medical history of CHF, CKD, DM, HTN, BL BKA, STEMI Oct 2021 admitted on 12/06/2019 with sepsis dt PNE.   Clinical Assessment and Goals of Care:  I have reviewed medical records including EPIC notes, labs and imaging, received report from [facility] team, examined the patient and met at bedside with daughter, [name],  to discuss diagnosis prognosis, GOC, EOL wishes, disposition and options.  [name] is lying quietly in bed.  He appears acutely/chronically ill and frail.  He will only briefly open his eyes 1 time when I touch him in a call his name.  He is unable to make his basic needs known at this time.  Present today at bedside his daughter, [name].  I introduced Palliative Medicine as specialized medical care for people living with serious illness. It focuses on providing relief from the symptoms and stress of a serious illness.   We discussed a brief life review of the patient.  [name] worked as a Psychologist, sport and exercise for years and also worked as a Presenter, broadcasting during second shift.  He is a widower.  He has 3 adult children who are active with his care.  He lives in his own home and has help 24/7.  As far as functional and nutritional status, daughter [name] shares that he is able to transfer, and can walk some with a walker and two-person assist.  He recently had NSTEMI in October 2021 without intervention secondary to stage IV kidney disease.  We discussed current illness and what it means in the larger context of on-going co-morbidities.  Natural disease trajectory and expectations at EOL were discussed.  I share a diagram of  the chronic illness pathway, what is normal and expected.  We also talked about recovery time.  I share this sooner if [name] shows recovery, likely the more recovery he will experience.  We talked about the treatment plan including, but not limited to IV fluids, IV antibiotics, selected tests and labs.  We talked about time for outcomes.  Daughter states that, at this point, their preference is for [name] to return home if possible.  She shares that he had been doing outpatient rehab in [place] and felt this was a benefit to him.  Again, we talked about time for outcomes, his body will decide how he is able to recover.  She states.  Questions and concerns were addressed.  The family was encouraged to call with questions or concerns.   Conference with attending, bedside nursing staff, transition of care team related to patient condition, needs, goals of care.   HCPOA    NEXT OF KIN - daughter [name] tells me that she is the main contact, but she and her brothers, [name]  and [name] make choices as a team.     SUMMARY OF RECOMMENDATIONS   Time for outcomes, Treat the treatable.  Chest compressions, medications, cardioversion, but NO INTUBATION    Code Status/Advance Care Planning:  Limited code - ALL CARE, but NO INTUBATION   Symptom Management:   Per hospitalist, no additional needs at this time.   Palliative Prophylaxis:   Oral Care and Turn Reposition  Additional Recommendations (Limitations, Scope, Preferences):  treat the treatable, all measures except intubation   Psycho-social/Spiritual:   Desire for further Chaplaincy support:no  Additional Recommendations: Caregiving  Support/Resources and Education on Hospice  Prognosis:   Unable to determine, based on outcomes.  At this point, family prefers to return home if possible. Outpatient rehab if qualified.   Discharge Planning: To be determined, based on outcomes.       Primary Diagnoses: Present on  Admission: . PNA (pneumonia) . Sepsis due to PNA . Atrial fibrillation, chronic . Type 2 diabetes mellitus with vascular disease ([name]) . Chronic kidney disease, stage 3a . BPH (benign prostatic hyperplasia) . Chronic diastolic CHF (congestive heart failure) ([place]) . PAF (paroxysmal atrial fibrillation) ([place]) . Essential hypertension . Hypothyroidism . Acute metabolic encephalopathy   I have reviewed the medical record, interviewed the patient and family, and examined the patient. The following aspects are pertinent.  Past Medical History:  Diagnosis Date  . Arthritis   . Borderline diabetic   . CHF (congestive heart failure) ([place])   . Chronic kidney disease   . Diabetes mellitus without complication ([place])   . Dysrhythmia   . Glaucoma   . Hyperlipidemia   . Hypertension   . Hypothyroidism   . Myocardial infarction ([place])   . PVD (peripheral vascular disease) ([place])   . Sleep apnea    Cpap ordered but doesnt use  . Urinary retention due to benign prostatic hyperplasia 12/28/2018   Social History   Socioeconomic History  . Marital status: Widowed    Spouse name: Not on file  . Number of children: 3  . Years of education: 55 th  . Highest education level: Not on file  Occupational History  . Occupation: Retired Psychologist, sport and exercise  Tobacco Use  . Smoking status: Former Smoker    Quit date: 01/13/1965    Years since quitting: 54.9  . Smokeless tobacco: Former Systems developer    Types: Secondary school teacher  . Vaping Use: Never used  Substance and Sexual Activity  . Alcohol use: No  . Drug use: No  . Sexual activity: Not Currently  Other Topics Concern  . Not on file  Social History Narrative    Has 2 children! Very supportive.    Social Determinants of Health   Financial Resource Strain: Low Risk   . Difficulty of Paying Living Expenses: Not hard at all  Food Insecurity: No Food Insecurity  . Worried About Charity fundraiser in the Last Year: Never true  . Ran Out of Food in the Last  Year: Never true  Transportation Needs: No Transportation Needs  . Lack of Transportation (Medical): No  . Lack of Transportation (Non-Medical): No  Physical Activity: Inactive  . Days of Exercise per Week: 0 days  . Minutes of Exercise per Session: 0 min  Stress: Stress Concern Present  . Feeling of Stress : To some extent  Social Connections: Socially Isolated  . Frequency of Communication with Friends and Family: More than three times a week  . Frequency of Social Gatherings with Friends  and Family: More than three times a week  . Attends Religious Services: Never  . Active Member of Clubs or Organizations: No  . Attends Archivist Meetings: Never  . Marital Status: Widowed   Family History  Problem Relation Age of Onset  . Cancer Mother   . Heart attack Father   . Stroke Father   . Heart attack Brother   . Heart attack Brother    Scheduled Meds: . amLODipine  10 mg Oral Daily  . apixaban  2.5 mg Oral BID  . vitamin C  1,000 mg Oral Daily  . atorvastatin  40 mg Oral QPM  . carvedilol  3.125 mg Oral BID  . Chlorhexidine Gluconate Cloth  6 each Topical Daily  . cholecalciferol  5,000 Units Oral QPM  . clopidogrel  75 mg Oral Q breakfast  . dorzolamide-timolol  1 drop Both Eyes BID  . famotidine  20 mg Oral Daily  . ferrous sulfate  325 mg Oral BID WC  . finasteride  5 mg Oral QPM  . insulin aspart  0-5 Units Subcutaneous QHS  . insulin aspart  0-9 Units Subcutaneous TID WC  . latanoprost  1 drop Both Eyes QHS  . levothyroxine  75 mcg Oral QAC breakfast  . potassium chloride  40 mEq Oral Q3H  . pyridOXINE  100 mg Oral Daily  . sodium chloride flush  3 mL Intravenous Q12H  . sodium chloride flush  3 mL Intravenous Q12H  . vitamin E  400 Units Oral Daily   Continuous Infusions: . sodium chloride    . sodium chloride 100 mL/hr at 12/06/19 1540  . ceFEPime (MAXIPIME) IV    . [START ON 12/08/2019] vancomycin     PRN Meds:.sodium chloride, acetaminophen  **OR** acetaminophen, bisacodyl, ondansetron **OR** ondansetron (ZOFRAN) IV, polyethylene glycol, sodium chloride flush, traZODone Medications Prior to Admission:  Prior to Admission medications   Medication Sig Start Date End Date Taking? Authorizing Provider  acetaminophen (TYLENOL) 500 MG tablet Take 500 mg by mouth every 6 (six) hours as needed for headache (pain).   Yes [provider]  amLODipine (NORVASC) 10 MG tablet Take 1 tablet (10 mg total) by mouth daily. 11/16/19 02/14/20 Yes Hilty, Nadean Corwin, MD  apixaban (ELIQUIS) 5 MG TABS tablet Take 2.5 mg by mouth 2 (two) times daily.    Yes [provider]  Ascorbic Acid (VITAMIN C) 1000 MG tablet Take 1,000 mg by mouth daily.    Yes [provider]  atorvastatin (LIPITOR) 40 MG tablet Take 1 tablet (40 mg total) by mouth every evening. 10/25/19  Yes Barrett, Evelene Croon, PA-C  betamethasone dipropionate 0.05 % cream Apply 1 application topically 2 (two) times daily.  12/03/19  Yes [provider]  carvedilol (COREG) 3.125 MG tablet Take 1 tablet (3.125 mg total) by mouth 2 (two) times daily. 10/25/19 01/23/20 Yes Barrett, Evelene Croon, PA-C  Cholecalciferol (VITAMIN D-3) 125 MCG (5000 UT) TABS Take 5,000 Units by mouth every evening.   Yes [provider]  clopidogrel (PLAVIX) 75 MG tablet Take 1 tablet (75 mg total) by mouth daily with breakfast. 12/03/19  Yes Hilty, Nadean Corwin, MD  famotidine (PEPCID) 20 MG tablet Take 1 tablet (20 mg total) by mouth daily. 11/12/18  Yes Johnson, Clanford L, MD  ferrous sulfate 325 (65 FE) MG tablet Take 325 mg by mouth 2 (two) times daily with a meal.   Yes [provider]  furosemide (LASIX) 40 MG tablet Take 20-40  mg by mouth See admin instructions. Take one tablet (40 mg) by mouth every morning and 1/2 tablet (20 mg) every evening   Yes [provider]  glipiZIDE (GLUCOTROL) 5 MG tablet Take 5 mg by mouth 2 (two) times daily.    Yes [provider]  latanoprost (XALATAN) 0.005 % ophthalmic solution Place 1 drop into both eyes at bedtime. 09/30/19  Yes [provider]  levothyroxine (SYNTHROID) 75 MCG tablet Take 75 mcg by mouth daily before breakfast.  02/12/19  Yes [provider]  Misc Natural Products ([facility] MSM FORMULA PO) Take 1 tablet by mouth 2 (two) times daily.   Yes [provider]  polyethylene glycol (MIRALAX) 17 g packet Take 17 g by mouth daily as needed for moderate constipation. 12/09/18  Yes Mercy Riding, MD  Potassium Gluconate 550 (90 K) MG TABS Take 90 mg by mouth 2 (two) times daily.   Yes [provider]  pyridoxine (B-6) 100 MG tablet Take 100 mg by mouth daily.   Yes [provider]  traMADol (ULTRAM) 50 MG tablet Take 1 tablet (50 mg total) by mouth every 6 (six) hours as needed. Patient taking differently: Take 50 mg by mouth every 6 (six) hours as needed (pain).  02/15/19  Yes Lovorn, Megan, MD  vitamin E 400 UNIT capsule Take 400 Units by mouth daily.   Yes [provider]  ciprofloxacin (CIPRO) 250 MG tablet Take 250 mg by mouth 2 (two) times daily. Patient not taking: Reported on 12/06/2019 11/01/19   [provider]  dorzolamide-timolol (COSOPT) 22.3-6.8 MG/ML ophthalmic solution Place 1 drop into both eyes 2 (two) times daily.  Patient not taking: Reported on 12/06/2019    [provider]  finasteride (PROSCAR) 5 MG tablet Take 1 tablet (5 mg total) by mouth every evening. Patient not taking: Reported on 12/06/2019 09/22/18   Cathlyn Parsons, PA-C  hydrOXYzine (ATARAX/VISTARIL) 25 MG tablet Take 25 mg by mouth 4 (four) times daily. Patient not taking: Reported on 12/06/2019 11/30/19   [provider]  [facility] ULTRA test strip 1 each 2 (two) times daily. 04/15/19   [provider]  tamsulosin (FLOMAX) 0.4 MG CAPS capsule Take 0.4 mg by mouth daily. Patient not taking: Reported on 12/06/2019 11/13/19    [provider]   Allergies  Allergen Reactions  . Iodinated Diagnostic Agents Other (See Comments)    caused Fever  . Dye Fdc Red [Red Dye] Hives    Tolerated red Docusate, Niferex   Review of Systems  Unable to perform ROS: Age    Physical Exam Vitals and nursing note reviewed.  Constitutional:      General: He is not in acute distress.    Appearance: He is ill-appearing.     Comments: Will briefly open eyes  HENT:     Head: Normocephalic and atraumatic.     Mouth/Throat:     Mouth: Mucous membranes are dry.  Cardiovascular:     Rate and Rhythm: Normal rate.  Pulmonary:     Effort: Pulmonary effort is normal. No respiratory distress.  Abdominal:     General: Abdomen is flat.  Skin:    General: Skin is warm and dry.  Neurological:     Comments: Does not respond to voice or touch     Vital Signs: BP (!) 109/51 (BP Location: Left Arm)   Pulse 73   Temp 98.2 F (36.8 C) (Oral)   Resp 17   Ht  5' 8"  (1.727 m)   Wt 103.3 kg   SpO2 98%   BMI 34.63 kg/m  Pain Scale: 0-10   Pain Score: 0-No pain   SpO2: SpO2: 98 % O2 Device:SpO2: 98 % O2 Flow Rate: .   IO: Intake/output summary:   Intake/Output Summary (Last 24 hours) at 12/07/2019 5465 Last data filed at 12/07/2019 0425 Gross per 24 hour  Intake 2671.06 ml  Output 1450 ml  Net 1221.06 ml    LBM: Last BM Date: 12/06/19 Baseline Weight: Weight: 98.9 kg Most recent weight: Weight: 103.3 kg     Palliative Assessment/Data:   Flowsheet Rows     Most Recent Value  Intake Tab  Referral Department Hospitalist  Unit at Time of Referral Cardiac/Telemetry Unit  Palliative Care Primary Diagnosis Sepsis/Infectious Disease  Date Notified 12/06/19  Palliative Care Type New Palliative care  Reason for referral Clarify Goals of Care  Date of Admission 12/06/19  Date first seen by Palliative Care 12/07/19  # of days Palliative referral response time 1 Day(s)  # of days IP prior to Palliative referral  0  Clinical Assessment  Palliative Performance Scale Score 40%  Pain Max last 24 hours Not able to report  Pain Min Last 24 hours Not able to report  Dyspnea Max Last 24 Hours Not able to report  Dyspnea Min Last 24 hours Not able to report  Psychosocial & Spiritual Assessment  Palliative Care Outcomes      Time In: 1110 Time Out: 1200 Time Total: 50 minutes  Greater than 50%  of this time was spent counseling and coordinating care related to the above assessment and plan.  Signed by: Drue Novel, NP   Please contact Palliative Medicine Team phone at [phone] for questions and concerns.  For individual provider: See Shea Evans

## 2019-12-08 ENCOUNTER — Inpatient Hospital Stay (HOSPITAL_COMMUNITY): Payer: Medicare Other

## 2019-12-08 DIAGNOSIS — I480 Paroxysmal atrial fibrillation: Secondary | ICD-10-CM

## 2019-12-08 DIAGNOSIS — J189 Pneumonia, unspecified organism: Secondary | ICD-10-CM | POA: Diagnosis not present

## 2019-12-08 DIAGNOSIS — Z7189 Other specified counseling: Secondary | ICD-10-CM | POA: Diagnosis not present

## 2019-12-08 DIAGNOSIS — I482 Chronic atrial fibrillation, unspecified: Secondary | ICD-10-CM | POA: Diagnosis not present

## 2019-12-08 DIAGNOSIS — E039 Hypothyroidism, unspecified: Secondary | ICD-10-CM

## 2019-12-08 DIAGNOSIS — Z515 Encounter for palliative care: Secondary | ICD-10-CM | POA: Diagnosis not present

## 2019-12-08 DIAGNOSIS — I5032 Chronic diastolic (congestive) heart failure: Secondary | ICD-10-CM | POA: Diagnosis not present

## 2019-12-08 DIAGNOSIS — A419 Sepsis, unspecified organism: Secondary | ICD-10-CM | POA: Diagnosis not present

## 2019-12-08 DIAGNOSIS — G9341 Metabolic encephalopathy: Secondary | ICD-10-CM | POA: Diagnosis not present

## 2019-12-08 LAB — CBC
HCT: 35.3 % — ABNORMAL LOW (ref 39.0–52.0)
Hemoglobin: 11.7 g/dL — ABNORMAL LOW (ref 13.0–17.0)
MCH: 32.2 pg (ref 26.0–34.0)
MCHC: 33.1 g/dL (ref 30.0–36.0)
MCV: 97.2 fL (ref 80.0–100.0)
Platelets: 191 10*3/uL (ref 150–400)
RBC: 3.63 MIL/uL — ABNORMAL LOW (ref 4.22–5.81)
RDW: 15 % (ref 11.5–15.5)
WBC: 11.7 10*3/uL — ABNORMAL HIGH (ref 4.0–10.5)
nRBC: 0 % (ref 0.0–0.2)

## 2019-12-08 LAB — BASIC METABOLIC PANEL
Anion gap: 8 (ref 5–15)
BUN: 48 mg/dL — ABNORMAL HIGH (ref 8–23)
CO2: 22 mmol/L (ref 22–32)
Calcium: 8.6 mg/dL — ABNORMAL LOW (ref 8.9–10.3)
Chloride: 103 mmol/L (ref 98–111)
Creatinine, Ser: 2.35 mg/dL — ABNORMAL HIGH (ref 0.61–1.24)
GFR, Estimated: 26 mL/min — ABNORMAL LOW (ref >60)
Glucose, Bld: 196 mg/dL — ABNORMAL HIGH (ref 70–99)
Potassium: 4.1 mmol/L (ref 3.5–5.1)
Sodium: 133 mmol/L — ABNORMAL LOW (ref 135–145)

## 2019-12-08 LAB — TROPONIN I (HIGH SENSITIVITY)
Troponin I (High Sensitivity): 3 ng/L (ref <18)
Troponin I (High Sensitivity): 126 ng/L (ref <18)

## 2019-12-08 LAB — GLUCOSE, CAPILLARY
Glucose-Capillary: 175 mg/dL — ABNORMAL HIGH (ref 70–99)
Glucose-Capillary: 189 mg/dL — ABNORMAL HIGH (ref 70–99)
Glucose-Capillary: 204 mg/dL — ABNORMAL HIGH (ref 70–99)

## 2019-12-08 MED ORDER — FUROSEMIDE 10 MG/ML IJ SOLN
40.0000 mg | Freq: Once | INTRAMUSCULAR | Status: AC
  Administered 2019-12-08: 40 mg via INTRAVENOUS
  Filled 2019-12-08: qty 4

## 2019-12-08 MED ORDER — MORPHINE SULFATE (PF) 2 MG/ML IV SOLN: 0.5000 mg | INTRAVENOUS | Status: DC | PRN

## 2019-12-08 MED ORDER — MORPHINE SULFATE (PF) 2 MG/ML IV SOLN
0.5000 mg | Freq: Once | INTRAVENOUS | Status: AC
  Administered 2019-12-08: 0.5 mg via INTRAVENOUS
  Filled 2019-12-08: qty 1

## 2019-12-08 NOTE — Care Management Important Message (Signed)
Important Message  Patient Details  Name: [name] MRN: [id number] Date of Birth: 1927/05/13   Medicare Important Message Given:  Yes     [name] 12/08/2019, 4:24 PM

## 2019-12-08 NOTE — Progress Notes (Signed)
CRITICAL VALUE ALERT  Critical Value:  Troponin 126  Date & Time Notied:  11/24/20201 1340  Provider Notified: Eleonore Chiquito, MD  Orders Received/Actions taken: no new orders at this time.

## 2019-12-08 NOTE — Progress Notes (Signed)
[facility]  PROGRESS NOTE  [name] QQP:[id number] DOB: 12-Dec-1927 DOA: 12/06/2019 PCP: Redmond School, MD   Brief HPI:   86 year old male with a history of diabetes mellitus type 2, hypertension, hyperlipidemia, PAD s/p bilateral BKA, aortic stenosis, PAF, diastolic CHF, CKD stage IV, OSA not on CPAP, bradycardia, hypothyroidism, CAD NSTEMI in October 2021 was admitted on 11-21 with sepsis with pneumonia.   Subjective   Patient seen and examined, complains of chest pain this morning.  Chest x-ray showed pulmonary edema.   Assessment/Plan:     1. Sepsis due to pneumonia-patient met sepsis care evaluation with leukocytosis, fever, tachypnea.  Continue vancomycin and cefepime. 2. Acute pulmonary edema-patient had recent NSTEMI in October 21.  Also echocardiogram showed chronic diastolic dysfunction with a EF 55 to 60%.  Troponin is elevated today 126.  Patient was not found to be candidate for intervention due to CKD stage IV.  Continue Lipitor, Coreg and Plavix.  He diuresed well with IV Lasix 40 mg x 1.  Will also start morphine 0.5 mg IV every 4 hours as needed for dyspnea. 3. CKD stage IV-creatinine is currently on 2.35 which is close to patient baseline.  Bicarb is 22, monitor closely. 4. Hypothyroidism-continue Synthroid 75 mcg daily. 5. Diabetes mellitus type 2-hemoglobin A1c is 8.5 reflecting uncontrolled diabetes mellitus with hyperglycemia.  Glipizide on hold.  Continue sliding scale insulin NovoLog. 6. Chronic atrial fibrillation-continue apixaban, Coreg for rate control. 7. Hypertension-continue amlodipine, Coreg 8. Goals of care-palliative care consulted, patient is partial code, no intubation.  If patient continues to decline he may be eligible for hospice at home.     COVID-19 Labs  No results for input(s): DDIMER, FERRITIN, LDH, CRP in the last 72 hours.  Lab Results  Component Value Date   SARSCOV2NAA NEGATIVE 12/06/2019   [place] NEGATIVE  10/05/2019   [place] NEGATIVE 02/02/2019   [place] NEGATIVE 01/18/2019     Scheduled medications:   . amLODipine  10 mg Oral Daily  . apixaban  2.5 mg Oral BID  . vitamin C  1,000 mg Oral Daily  . atorvastatin  40 mg Oral QPM  . carvedilol  3.125 mg Oral BID  . Chlorhexidine Gluconate Cloth  6 each Topical Daily  . cholecalciferol  5,000 Units Oral QPM  . clopidogrel  75 mg Oral Q breakfast  . dorzolamide-timolol  1 drop Both Eyes BID  . famotidine  20 mg Oral Daily  . ferrous sulfate  325 mg Oral BID WC  . finasteride  5 mg Oral QPM  . insulin aspart  0-5 Units Subcutaneous QHS  . insulin aspart  0-9 Units Subcutaneous TID WC  . latanoprost  1 drop Both Eyes QHS  . levothyroxine  75 mcg Oral QAC breakfast  . mupirocin ointment  1 application Nasal BID  . pyridOXINE  100 mg Oral Daily  . sodium chloride flush  3 mL Intravenous Q12H  . sodium chloride flush  3 mL Intravenous Q12H  . vitamin E  400 Units Oral Daily         CBG: Recent Labs  Lab 12/06/19 2056 12/07/19 0717 12/07/19 2126 12/08/19 0726 12/08/19 1105  GLUCAP 216* 156* 232* 175* 189*    SpO2: 94 % O2 Flow Rate (L/min): 3.5 L/min    CBC: Recent Labs  Lab 12/05/19 1155 12/06/19 1144 12/07/19 0515 12/08/19 0800  WBC 16.6* 22.6* 15.9* 11.7*  NEUTROABS  --  17.8*  --   --   HGB 12.5* 12.7* 10.9*  11.7*  HCT 36.7* 37.1* 32.4* 35.3*  MCV 95.8 95.4 95.9 97.2  PLT 233 220 191 742    Basic Metabolic Panel: Recent Labs  Lab 12/05/19 1155 12/06/19 1144 12/07/19 0515 12/08/19 0800  NA 130* 130* 134* 133*  K 3.8 3.6 3.3* 4.1  CL 92* 94* 98 103  CO2 27 23 25 22   GLUCOSE 443* 192* 184* 196*  BUN 48* 49* 47* 48*  CREATININE 2.84* 2.53* 2.41* 2.35*  CALCIUM 8.9 8.9 8.4* 8.6*     Liver Function Tests: Recent Labs  Lab 12/06/19 1144  AST 26  ALT 23  ALKPHOS 48  BILITOT 1.0  PROT 7.6  ALBUMIN 3.6     Antibiotics: Anti-infectives (From admission, onward)   Start     Dose/Rate  Route Frequency Ordered Stop   12/08/19 1400  vancomycin (VANCOREADY) IVPB 1500 mg/300 mL        1,500 mg 150 mL/hr over 120 Minutes Intravenous Every 48 hours 12/06/19 1339     12/07/19 1400  ceFEPIme (MAXIPIME) 1 g in sodium chloride 0.9 % 100 mL IVPB        1 g 200 mL/hr over 30 Minutes Intravenous Every 24 hours 12/06/19 1315     12/06/19 1315  ceFEPIme (MAXIPIME) 2 g in sodium chloride 0.9 % 100 mL IVPB        2 g 200 mL/hr over 30 Minutes Intravenous  Once 12/06/19 1305 12/06/19 1402   12/06/19 1315  vancomycin (VANCOCIN) IVPB 1000 mg/200 mL premix  Status:  Discontinued        1,000 mg 200 mL/hr over 60 Minutes Intravenous  Once 12/06/19 1305 12/06/19 1309   12/06/19 1315  vancomycin (VANCOREADY) IVPB 2000 mg/400 mL        2,000 mg 200 mL/hr over 120 Minutes Intravenous  Once 12/06/19 1309 12/06/19 1619       DVT prophylaxis: Apixaban  Code Status: Partial code  Family Communication: No family at bedside   Consultants:    Procedures:      Objective   Vitals:   12/07/19 2221 12/08/19 0500 12/08/19 0540 12/08/19 1403  BP: (!) 153/69  (!) 143/62 140/68  Pulse: 69  (!) 58 (!) 57  Resp: 20  18 20   Temp: 97.8 F (36.6 C)  98.5 F (36.9 C) 98 F (36.7 C)  TempSrc: Oral  Oral Axillary  SpO2: 90%  93% 94%  Weight:  99.7 kg    Height:        Intake/Output Summary (Last 24 hours) at 12/08/2019 1821 Last data filed at 12/08/2019 1300 Gross per 24 hour  Intake 370 ml  Output --  Net 370 ml    11/22 1901 - 11/24 0700 In: 493 [P.O.:490; I.V.:3] Out: 1200 [Urine:1200]  Filed Weights   12/06/19 1128 12/06/19 1451 12/08/19 0500  Weight: 98.9 kg 103.3 kg 99.7 kg    Physical Examination:   General-appears in no acute distress  Heart-S1-S2, regular, no murmur auscultated  Lungs-clear to auscultation bilaterally, no wheezing or crackles auscultated  Abdomen-soft, nontender, no organomegaly  Extremities-no edema in the lower  extremities  Neuro-alert, oriented x3, no focal deficit noted   Status is: Inpatient  Dispo: The patient is from: Home              Anticipated d/c is to: To be decided              Anticipated d/c date is: 12/10/2019  Patient currently not medically stable for discharge  Barrier to discharge-ongoing treatment for pulmonary edema  Pressure Injury 02/03/19 Buttocks Right Stage 1 -  Intact skin with non-blanchable redness of a localized area usually over a bony prominence. (Active)  02/03/19 0020  Location: Buttocks  Location Orientation: Right  Staging: Stage 1 -  Intact skin with non-blanchable redness of a localized area usually over a bony prominence.  Wound Description (Comments):   Present on Admission: Yes     Pressure Injury 02/03/19 Buttocks Left Stage 2 -  Partial thickness loss of dermis presenting as a shallow open injury with a red, pink wound bed without slough. (Active)  02/03/19 1707  Location: Buttocks  Location Orientation: Left  Staging: Stage 2 -  Partial thickness loss of dermis presenting as a shallow open injury with a red, pink wound bed without slough.  Wound Description (Comments):   Present on Admission: Yes           Data Reviewed:   Recent Results (from the past 240 hour(s))  Blood culture (routine x 2)     Status: None (Preliminary result)   Collection Time: 12/06/19 11:45 AM   Specimen: BLOOD  Result Value Ref Range Status   Specimen Description BLOOD RIGHT ANTECUBITAL  Final   Special Requests   Final    BOTTLES DRAWN AEROBIC AND ANAEROBIC Blood Culture adequate volume   Culture   Final    NO GROWTH 2 DAYS Performed at [facility], [address], [place], [place] [address]    Report Status PENDING  Incomplete  Blood culture (routine x 2)     Status: None (Preliminary result)   Collection Time: 12/06/19 11:50 AM   Specimen: BLOOD  Result Value Ref Range Status   Specimen Description BLOOD RIGHT ARM  Final   Special  Requests   Final    BOTTLES DRAWN AEROBIC AND ANAEROBIC Blood Culture adequate volume   Culture   Final    NO GROWTH 2 DAYS Performed at [facility], [address] [place], [place] [address]    Report Status PENDING  Incomplete  Urine culture     Status: Abnormal (Preliminary result)   Collection Time: 12/06/19  2:06 PM   Specimen: Urine, Catheterized  Result Value Ref Range Status   Specimen Description   Final    URINE, CATHETERIZED Performed at [facility], [address], [place], [place] [address]    Special Requests   Final    NONE Performed at [facility], [address], [place], [place] [address]    Culture (A)  Final    >=100,000 COLONIES/mL PSEUDOMONAS AERUGINOSA CULTURE REINCUBATED FOR BETTER GROWTH SUSCEPTIBILITIES TO FOLLOW Performed at [facility], [place] [address] [place], [place] [address]    Report Status PENDING  Incomplete   Organism ID, Bacteria PSEUDOMONAS AERUGINOSA (A)  Final      Susceptibility   Pseudomonas aeruginosa - MIC*    CEFTAZIDIME 4 SENSITIVE Sensitive     CIPROFLOXACIN <=0.25 SENSITIVE Sensitive     GENTAMICIN <=1 SENSITIVE Sensitive     IMIPENEM 1 SENSITIVE Sensitive     PIP/TAZO 8 SENSITIVE Sensitive     CEFEPIME 2 SENSITIVE Sensitive     * >=100,000 COLONIES/mL PSEUDOMONAS AERUGINOSA  Resp Panel by RT-PCR (Flu A&B, Covid) Nasopharyngeal Swab     Status: None   Collection Time: 12/06/19  2:07 PM   Specimen: Nasopharyngeal Swab; Nasopharyngeal(NP) swabs in vial transport medium  Result Value Ref Range Status  SARS Coronavirus 2 by RT PCR NEGATIVE NEGATIVE Final    Comment: (NOTE) SARS-CoV-2 target nucleic acids are NOT DETECTED.  The SARS-CoV-2 RNA is generally detectable in upper respiratory specimens during the acute phase of infection. The lowest concentration of SARS-CoV-2 viral copies this assay can detect is 138 copies/mL. A negative result does not preclude SARS-Cov-2 infection and should not be used as the sole  basis for treatment or other patient management decisions. A negative result may occur with  improper specimen collection/handling, submission of specimen other than nasopharyngeal swab, presence of viral mutation(s) within the areas targeted by this assay, and inadequate number of viral copies(<138 copies/mL). A negative result must be combined with clinical observations, patient history, and epidemiological information. The expected result is Negative.  Fact Sheet for Patients:  EntrepreneurPulse.com.au  Fact Sheet for Healthcare Providers:  IncredibleEmployment.be  This test is no t yet approved or cleared by the [place] FDA and  has been authorized for detection and/or diagnosis of SARS-CoV-2 by FDA under an Emergency Use Authorization (EUA). This EUA will remain  in effect (meaning this test can be used) for the duration of the COVID-19 declaration under Section 564(b)(1) of the Act, 21 U.S.C.section 360bbb-3(b)(1), unless the authorization is terminated  or revoked sooner.       Influenza A by PCR NEGATIVE NEGATIVE Final   Influenza B by PCR NEGATIVE NEGATIVE Final    Comment: (NOTE) The Xpert Xpress SARS-CoV-2/FLU/RSV plus assay is intended as an aid in the diagnosis of influenza from Nasopharyngeal swab specimens and should not be used as a sole basis for treatment. Nasal washings and aspirates are unacceptable for Xpert Xpress SARS-CoV-2/FLU/RSV testing.  Fact Sheet for Patients: EntrepreneurPulse.com.au  Fact Sheet for Healthcare Providers: IncredibleEmployment.be  This test is not yet approved or cleared by the [place] FDA and has been authorized for detection and/or diagnosis of SARS-CoV-2 by FDA under an Emergency Use Authorization (EUA). This EUA will remain in effect (meaning this test can be used) for the duration of the COVID-19 declaration under Section 564(b)(1) of the Act,  21 U.S.C. section 360bbb-3(b)(1), unless the authorization is terminated or revoked.  Performed at [facility], [address], [place], [place] [address]   MRSA PCR Screening     Status: Abnormal   Collection Time: 12/07/19  8:31 AM   Specimen: Nasal Mucosa; Nasopharyngeal  Result Value Ref Range Status   MRSA by PCR POSITIVE (A) NEGATIVE Final    Comment:        The GeneXpert MRSA Assay (FDA approved for NASAL specimens only), is one component of a comprehensive MRSA colonization surveillance program. It is not intended to diagnose MRSA infection nor to guide or monitor treatment for MRSA infections. RESULT CALLED TO, READ BACK BY AND VERIFIED WITH: DEAN,T AT 1140 ON 12/07/19 BY HUFFINES,S. Performed at [facility], [address], [place], [place] [address]     No results for input(s): LIPASE, AMYLASE in the last 168 hours. No results for input(s): AMMONIA in the last 168 hours.  Cardiac Enzymes: No results for input(s): CKTOTAL, CKMB, CKMBINDEX, TROPONINI in the last 168 hours. BNP (last 3 results) Recent Labs    12/09/18 0343  BNP 249.1*    ProBNP (last 3 results) No results for input(s): PROBNP in the last 8760 hours.  Studies:  DG CHEST PORT 1 VIEW  Result Date: 12/08/2019 CLINICAL DATA:  Severe sepsis.  Pneumonia. EXAM: PORTABLE CHEST 1 VIEW COMPARISON:  One-view chest x-ray 12/06/2019. FINDINGS: Heart  is enlarged. Increasing interstitial edema is present. Bilateral effusions are increasing, left greater than right. Bibasilar airspace disease is now present. IMPRESSION: 1. Cardiomegaly with increasing interstitial edema and bilateral effusions compatible with congestive heart failure. 2. Bibasilar airspace disease likely reflects atelectasis. Electronically Signed   By: San Morelle M.D.   On: 12/08/2019 07:44       [place]   [facility] If 7PM-7AM, please contact night-coverage at [email or web address], Office  [phone]   12/08/2019,  6:21 PM  LOS: 2 days

## 2019-12-08 NOTE — Progress Notes (Signed)
**Note De-identified  Obfuscation** EKG complete and placed in patient chart 

## 2019-12-08 NOTE — TOC Initial Note (Signed)
Transition of Care [facility]) - Initial/Assessment Note    Patient Details  Name: [name] MRN: [id number] Date of Birth: 1927-03-06  Transition of [facility]) CM/SW Contact:    Shade Flood, LCSW Phone Number: 12/08/2019, 10:48 AM  Clinical Narrative:                  Pt admitted from home. Prior to admission, pt was residing at home with assistance from his family. Pt has all DME in place. He was participating in outpatient therapy prior to admission. Pt has all DME at home. Palliative Care following. Pt/family goal is for return to home at dc. Medical workup is in progress.  TOC will follow and continue to assess and assist with dc planning.  Expected Discharge Plan: [place] Barriers to Discharge: Continued Medical Work up   Patient Goals and CMS Choice        Expected Discharge Plan and Services Expected Discharge Plan: [place] In-house Referral: Clinical Social Work     Living arrangements for the past 2 months: [place]                                      Prior Living Arrangements/Services Living arrangements for the past 2 months: Single Family Home Lives with:: Adult Children Patient language and need for interpreter reviewed:: Yes Do you feel safe going back to the place where you live?: Yes      Need for Family Participation in Patient Care: Yes (Comment) Care giver support system in place?: Yes (comment)   Criminal Activity/Legal Involvement Pertinent to Current Situation/Hospitalization: No - Comment as needed  Activities of Daily Living Home Assistive Devices/Equipment: Eyeglasses, Dentures (specify type), Prosthesis, Wheelchair, Bedside commode/3-in-1, Hospital bed, Other (Comment) (steady) ADL Screening (condition at time of admission) Patient's cognitive ability adequate to safely complete daily activities?: Yes Is the patient deaf or have difficulty hearing?: Yes Does the patient have  difficulty seeing, even when wearing glasses/contacts?: No Does the patient have difficulty concentrating, remembering, or making decisions?: Yes Patient able to express need for assistance with ADLs?: No Does the patient have difficulty dressing or bathing?: Yes Independently performs ADLs?: No Communication: Independent Dressing (OT): Needs assistance Is this a change from baseline?: Pre-admission baseline Grooming: Needs assistance Is this a change from baseline?: Pre-admission baseline Feeding: Needs assistance Is this a change from baseline?: Pre-admission baseline Bathing: Needs assistance Is this a change from baseline?: Pre-admission baseline Toileting: Needs assistance Is this a change from baseline?: Pre-admission baseline In/Out Bed: Needs assistance Is this a change from baseline?: Pre-admission baseline Walks in Home: Needs assistance Is this a change from baseline?: Pre-admission baseline Does the patient have difficulty walking or climbing stairs?: Yes Weakness of Legs: Both Weakness of Arms/Hands: Both  Permission Sought/Granted                  Emotional Assessment       Orientation: : Oriented to Self Alcohol / Substance Use: Not Applicable Psych Involvement: No (comment)  Admission diagnosis:  PNA (pneumonia) [J18.9] Community acquired pneumonia of left lower lobe of lung [J18.9] Patient Active Problem List   Diagnosis Date Noted  . PNA (pneumonia) 12/06/2019  . Sepsis due to PNA 12/06/2019  . Acute metabolic encephalopathy 40/98/1191  . AKI (acute kidney injury) ([place])   . Aortic valve stenosis   . Palliative  care encounter   . NSTEMI (non-ST elevated myocardial infarction) ([place]) 10/04/2019  . Acute kidney injury superimposed on CKD ([place]) 10/04/2019  . Hypokalemia 10/04/2019  . Weakness generalized 04/05/2019  . S/P BKA (below knee amputation) unilateral, left ([place]) 02/15/2019  . Pressure injury of skin 02/03/2019  . TIA (transient ischemic  attack) 02/03/2019  . Transient speech disturbance 02/02/2019  . Chronic indwelling Foley catheter 01/22/2019  . Postoperative wound infection 01/20/2019  . Normocytic anemia 01/18/2019  . Cellulitis 01/18/2019  . Infection of deep incisional surgical site after procedure   . Abnormal urinalysis   . Labile blood glucose   . Urinary retention   . S/P bilateral BKA (below knee amputation) ([place])   . Acute blood loss anemia   . Urinary retention due to benign prostatic hyperplasia 12/28/2018  . Unilateral complete BKA, left, initial encounter ([place]) 12/25/2018  . Dehiscence of amputation stump ([place])   . History of transmetatarsal amputation of left foot ([place]) 12/09/2018  . Critical limb ischemia with history of revascularization of same extremity ([place])   . Gangrene of left foot ([place])   . Diabetic ulcer of toe of left foot associated with type 2 diabetes mellitus ([place])   . Cellulitis of left lower extremity   . Toe infection 11/18/2018  . Peripheral edema   . Acute on chronic diastolic CHF (congestive heart failure) ([place]) 11/09/2018  . S/P BKA (below knee amputation) unilateral, right ([place]) 10/05/2018  . Hyponatremia   . Essential hypertension   . Postoperative pain   . Pain of left heel   . Unable to maintain weight-bearing   . Type 2 diabetes mellitus with diabetic peripheral angiopathy with gangrene ([place])   . Anemia due to acute blood loss   . Chronic kidney disease, stage 3a   . Acquired absence of right leg below knee ([place]) 09/07/2018  . Gangrene of right foot ([place])   . DNR (do not resuscitate) discussion   . Goals of care, counseling/discussion   . Palliative care by specialist   . Type 2 diabetes mellitus with vascular disease ([place])   . Severe protein-calorie malnutrition ([place])   . Ischemic foot 08/20/2018  . Critical lower limb ischemia ([place]) 08/03/2018  . Type 2 diabetes mellitus with foot ulcer, without long-term current use of insulin ([place])   . Gastroesophageal reflux  disease   . Cellulitis of great toe of right foot 05/29/2018  . Atrial fibrillation, chronic   . Chest pain 07/25/2017  . PAF (paroxysmal atrial fibrillation) ([place]) 07/25/2017  . BPH (benign prostatic hyperplasia) 07/25/2017  . Shortness of breath 05/11/2017  . Hypothyroidism 05/11/2017  . Chronic diastolic CHF (congestive heart failure) ([place]) 05/11/2017  . Moderate aortic stenosis 06/12/2016  . RBBB 06/12/2016  . Peripheral vascular disease ([place]) 08/04/2012  . Essential hypertension, benign 08/04/2012  . Hyperlipidemia 08/04/2012  . Dizziness 08/04/2012   PCP:  Redmond School, MD Pharmacy:   [facility] [place], [place] AT [place] [address] [place] [address] Phone: [phone] Fax: [phone]     Social Determinants of Health (SDOH) Interventions    Readmission Risk Interventions Readmission Risk Prevention Plan 12/08/2019 10/06/2019 02/04/2019  Transportation Screening Complete Complete Complete  PCP or Specialist Appt within 3-5 Days - - -  Not Complete comments - - -  HRI or [place] or Home Care Consult comments - - -  Social Work Scientific laboratory technician for Recovery  Care Planning/Counseling - - -  Palliative Care Screening - - -  Medication Review (RN Care Manager) Complete Complete Complete  PCP or Specialist appointment within 3-5 days of discharge - Complete Complete  HRI or [place] - Complete Complete  SW Recovery Care/Counseling Consult Complete Complete Complete  Palliative Care Screening Complete Not Applicable Not [place] Not Applicable Not Applicable Not Applicable  Some recent data might be hidden

## 2019-12-08 NOTE — Progress Notes (Signed)
Palliative: Mr. Alcocer is lying quietly in bed.  He appears chronically ill and frail, but comfortable.  He will open his eyes, but not make eye contact.  I believe that he is able to make his basic needs known if asked direct questions.  His daughter, [name] is at bedside at this time.  [name] and I not talk about Mr. Jasmin condition.  I share that nursing staff told me that Mr. Witzke had his arms raised and was talking to somebody at the ceiling.  [name] says that indeed he was, he was talking to his wife who has been deceased for about 7 years.  I shared that this may be a sign that time is short.  [name] states that she has heard this, also.    We talked about EKG and serial troponin, which are pending.  We talked about the realities of where Mr. Schuld is if he in fact is extending his heart attack.  [name] shares that she understands Mr. Viegas is not a candidate for interventional radiology for stenting.  She shares that she has been told to consider comfort care.   We talked about CODE STATUS.  I share my concerns that if Mr. Zahler is indeed extending heart attack, and his heart naturally stops, pressing on his chest would not change his heart attack.  I encouraged [name] to consider DNR status.  Roselyn Reef shares that her father had always said that if he could be brought back to a meaningful life he would want that.  I share my concerns that through his recent declines, he would not return to meaningful recovery.  I shared that statistics show most people who survive CPR spend the next 6 months or so in a nursing home before they eventually die.  Share with [name] that they have the right to say what this time looks like and feels like for Mr. Hakim.  I shared that the medical team is recommending DNR status.  [name] shares that she had to make this choice for her mother, her father and brothers could not.  I share that DNR would be a loving choice.  [name] shares that she will speak with her 2 brothers about CODE STATUS  and comfort measures.  We talked about comfort measures, what is and is not provided, and opening of visitation.   Conference with attending, bedside nursing staff, transition of care team related to patient condition, needs, goals of care, CODE STATUS discussion, comfort care discussions.  Plan:   Awaiting test results from EKG/troponin.  If Mr. Lux is indeed extending his heart attack, anticipate family will elect comfort measures.  28 minutes Quinn Axe, NP Palliative Medicine Team Team Phone # [phone]  Greater than 50% of this time was spent counseling and coordinating care related to the above assessment and plan.

## 2019-12-09 DIAGNOSIS — I5032 Chronic diastolic (congestive) heart failure: Secondary | ICD-10-CM | POA: Diagnosis not present

## 2019-12-09 DIAGNOSIS — I10 Essential (primary) hypertension: Secondary | ICD-10-CM | POA: Diagnosis not present

## 2019-12-09 DIAGNOSIS — E1159 Type 2 diabetes mellitus with other circulatory complications: Secondary | ICD-10-CM

## 2019-12-09 DIAGNOSIS — G9341 Metabolic encephalopathy: Secondary | ICD-10-CM | POA: Diagnosis not present

## 2019-12-09 DIAGNOSIS — A419 Sepsis, unspecified organism: Secondary | ICD-10-CM | POA: Diagnosis not present

## 2019-12-09 LAB — BASIC METABOLIC PANEL
Anion gap: 11 (ref 5–15)
BUN: 49 mg/dL — ABNORMAL HIGH (ref 8–23)
CO2: 22 mmol/L (ref 22–32)
Calcium: 8.8 mg/dL — ABNORMAL LOW (ref 8.9–10.3)
Chloride: 102 mmol/L (ref 98–111)
Creatinine, Ser: 2.57 mg/dL — ABNORMAL HIGH (ref 0.61–1.24)
GFR, Estimated: 23 mL/min — ABNORMAL LOW (ref >60)
Glucose, Bld: 182 mg/dL — ABNORMAL HIGH (ref 70–99)
Potassium: 4 mmol/L (ref 3.5–5.1)
Sodium: 135 mmol/L (ref 135–145)

## 2019-12-09 LAB — GLUCOSE, CAPILLARY
Glucose-Capillary: 171 mg/dL — ABNORMAL HIGH (ref 70–99)
Glucose-Capillary: 159 mg/dL — ABNORMAL HIGH (ref 70–99)
Glucose-Capillary: 237 mg/dL — ABNORMAL HIGH (ref 70–99)
Glucose-Capillary: 275 mg/dL — ABNORMAL HIGH (ref 70–99)
Glucose-Capillary: 237 mg/dL — ABNORMAL HIGH (ref 70–99)

## 2019-12-09 MED ORDER — CARVEDILOL 3.125 MG PO TABS
6.2500 mg | ORAL_TABLET | Freq: Two times a day (BID) | ORAL | Status: DC
  Administered 2019-12-09 – 2019-12-13 (×8): 6.25 mg via ORAL
  Filled 2019-12-09 (×8): qty 2

## 2019-12-09 MED ORDER — FUROSEMIDE 40 MG PO TABS
40.0000 mg | ORAL_TABLET | Freq: Every day | ORAL | Status: DC
  Administered 2019-12-09 – 2019-12-12 (×4): 40 mg via ORAL
  Filled 2019-12-09 (×2): qty 1
  Filled 2019-12-09: qty 2

## 2019-12-09 MED ORDER — CARVEDILOL 3.125 MG PO TABS: 6.2500 mg | ORAL_TABLET | Freq: Two times a day (BID) | ORAL | Status: DC

## 2019-12-09 NOTE — Progress Notes (Signed)
[facility]  PROGRESS NOTE  [name] QAS:[id number] DOB: 12-Sep-1927 DOA: 12/06/2019 PCP: Redmond School, MD   Brief HPI:   86 year old male with a history of diabetes mellitus type 2, hypertension, hyperlipidemia, PAD s/p bilateral BKA, aortic stenosis, PAF, diastolic CHF, CKD stage IV, OSA not on CPAP, bradycardia, hypothyroidism, CAD NSTEMI in October 2021 was admitted on 11-21 with sepsis with pneumonia.   Subjective   Patient seen and examined, breathing comfortably today.  Diuresed well with IV Lasix.   Assessment/Plan:     1. Sepsis due to pneumonia-patient met sepsis care evaluation with leukocytosis, fever, tachypnea.  Continue vancomycin and cefepime. 2. Acute pulmonary edema-patient had recent NSTEMI in October 21.  Also echocardiogram showed chronic diastolic dysfunction with a EF 55 to 60%.  Troponin is elevated today 126.  Patient was not found to be candidate for intervention due to CKD stage IV.  Continue Lipitor, Coreg and Plavix.  He diuresed well with IV Lasix 40 mg x 1.  We will start furosemide 40 mg p.o. daily.  Continue  morphine 0.5 mg IV every 4 hours as needed for dyspnea. 3. CKD stage IV-creatinine is currently on 2.57, which is little bit elevated due to IV Lasix.  Follow BMP in am.  4. Hypothyroidism-continue Synthroid 75 mcg daily. 5. Diabetes mellitus type 2-hemoglobin A1c is 8.5 reflecting uncontrolled diabetes mellitus with hyperglycemia.  Glipizide on hold.  Continue sliding scale insulin NovoLog. 6. Chronic atrial fibrillation-continue apixaban, Coreg for rate control. 7. Hypertension-blood pressure is mildly elevated, continue amlodipine, will increase dose of Coreg to 6.25 mg p.o. twice daily. 8. Goals of care-palliative care consulted, patient is partial code, no intubation.  If patient continues to decline he may be eligible for hospice at home.     COVID-19 Labs  No results for input(s): DDIMER, FERRITIN, LDH, CRP in the last 72  hours.  Lab Results  Component Value Date   SARSCOV2NAA NEGATIVE 12/06/2019   [place] NEGATIVE 10/05/2019   [place] NEGATIVE 02/02/2019   [place] NEGATIVE 01/18/2019     Scheduled medications:   . amLODipine  10 mg Oral Daily  . apixaban  2.5 mg Oral BID  . vitamin C  1,000 mg Oral Daily  . atorvastatin  40 mg Oral QPM  . carvedilol  6.25 mg Oral BID WC  . Chlorhexidine Gluconate Cloth  6 each Topical Daily  . cholecalciferol  5,000 Units Oral QPM  . clopidogrel  75 mg Oral Q breakfast  . dorzolamide-timolol  1 drop Both Eyes BID  . famotidine  20 mg Oral Daily  . ferrous sulfate  325 mg Oral BID WC  . finasteride  5 mg Oral QPM  . furosemide  40 mg Oral Daily  . insulin aspart  0-5 Units Subcutaneous QHS  . insulin aspart  0-9 Units Subcutaneous TID WC  . latanoprost  1 drop Both Eyes QHS  . levothyroxine  75 mcg Oral QAC breakfast  . mupirocin ointment  1 application Nasal BID  . pyridOXINE  100 mg Oral Daily  . sodium chloride flush  3 mL Intravenous Q12H  . sodium chloride flush  3 mL Intravenous Q12H  . vitamin E  400 Units Oral Daily         CBG: Recent Labs  Lab 12/08/19 0726 12/08/19 1105 12/08/19 2047 12/09/19 0802 12/09/19 1119  GLUCAP 175* 189* 204* 159* 237*    SpO2: 93 % O2 Flow Rate (L/min): 4 L/min    CBC: Recent Labs  Lab 12/05/19 1155 12/06/19 1144 12/07/19 0515 12/08/19 0800  WBC 16.6* 22.6* 15.9* 11.7*  NEUTROABS  --  17.8*  --   --   HGB 12.5* 12.7* 10.9* 11.7*  HCT 36.7* 37.1* 32.4* 35.3*  MCV 95.8 95.4 95.9 97.2  PLT 233 220 191 073    Basic Metabolic Panel: Recent Labs  Lab 12/05/19 1155 12/06/19 1144 12/07/19 0515 12/08/19 0800 12/09/19 0649  NA 130* 130* 134* 133* 135  K 3.8 3.6 3.3* 4.1 4.0  CL 92* 94* 98 103 102  CO2 27 23 25 22 22   GLUCOSE 443* 192* 184* 196* 182*  BUN 48* 49* 47* 48* 49*  CREATININE 2.84* 2.53* 2.41* 2.35* 2.57*  CALCIUM 8.9 8.9 8.4* 8.6* 8.8*     Liver Function  Tests: Recent Labs  Lab 12/06/19 1144  AST 26  ALT 23  ALKPHOS 48  BILITOT 1.0  PROT 7.6  ALBUMIN 3.6     Antibiotics: Anti-infectives (From admission, onward)   Start     Dose/Rate Route Frequency Ordered Stop   12/08/19 1400  vancomycin (VANCOREADY) IVPB 1500 mg/300 mL        1,500 mg 150 mL/hr over 120 Minutes Intravenous Every 48 hours 12/06/19 1339     12/07/19 1400  ceFEPIme (MAXIPIME) 1 g in sodium chloride 0.9 % 100 mL IVPB        1 g 200 mL/hr over 30 Minutes Intravenous Every 24 hours 12/06/19 1315     12/06/19 1315  ceFEPIme (MAXIPIME) 2 g in sodium chloride 0.9 % 100 mL IVPB        2 g 200 mL/hr over 30 Minutes Intravenous  Once 12/06/19 1305 12/06/19 1402   12/06/19 1315  vancomycin (VANCOCIN) IVPB 1000 mg/200 mL premix  Status:  Discontinued        1,000 mg 200 mL/hr over 60 Minutes Intravenous  Once 12/06/19 1305 12/06/19 1309   12/06/19 1315  vancomycin (VANCOREADY) IVPB 2000 mg/400 mL        2,000 mg 200 mL/hr over 120 Minutes Intravenous  Once 12/06/19 1309 12/06/19 1619       DVT prophylaxis: Apixaban  Code Status: Partial code  Family Communication: No family at bedside   Consultants:    Procedures:      Objective   Vitals:   12/08/19 0540 12/08/19 1403 12/08/19 2044 12/09/19 0609  BP: (!) 143/62 140/68 (!) 160/73 (!) 176/79  Pulse: (!) 58 (!) 57 63 64  Resp: 18 20 20 20   Temp: 98.5 F (36.9 C) 98 F (36.7 C) 97.6 F (36.4 C) (!) 97.5 F (36.4 C)  TempSrc: Oral Axillary Oral Oral  SpO2: 93% 94% 91% 93%  Weight:      Height:        Intake/Output Summary (Last 24 hours) at 12/09/2019 1522 Last data filed at 12/09/2019 0700 Gross per 24 hour  Intake --  Output 2775 ml  Net -2775 ml    11/23 1901 - 11/25 0700 In: 370 [P.O.:370] Out: 2775 [Urine:2775]  Filed Weights   12/06/19 1128 12/06/19 1451 12/08/19 0500  Weight: 98.9 kg 103.3 kg 99.7 kg    Physical Examination:   General-appears in no acute  distress  Heart-S1-S2, regular, no murmur auscultated  Lungs-clear to auscultation bilaterally, no wheezing or crackles auscultated  Abdomen-soft, nontender, no organomegaly  Extremities-no edema in the lower extremities  Neuro-alert, answering questions    Status is: Inpatient  Dispo: The patient is from: Home  Anticipated d/c is to: To be decided              Anticipated d/c date is: 12/13/2019              Patient currently not medically stable for discharge  Barrier to discharge-ongoing treatment for pulmonary edema  Pressure Injury 02/03/19 Buttocks Right Stage 1 -  Intact skin with non-blanchable redness of a localized area usually over a bony prominence. (Active)  02/03/19 0020  Location: Buttocks  Location Orientation: Right  Staging: Stage 1 -  Intact skin with non-blanchable redness of a localized area usually over a bony prominence.  Wound Description (Comments):   Present on Admission: Yes     Pressure Injury 02/03/19 Buttocks Left Stage 2 -  Partial thickness loss of dermis presenting as a shallow open injury with a red, pink wound bed without slough. (Active)  02/03/19 1707  Location: Buttocks  Location Orientation: Left  Staging: Stage 2 -  Partial thickness loss of dermis presenting as a shallow open injury with a red, pink wound bed without slough.  Wound Description (Comments):   Present on Admission: Yes           Data Reviewed:   Recent Results (from the past 240 hour(s))  Blood culture (routine x 2)     Status: None (Preliminary result)   Collection Time: 12/06/19 11:45 AM   Specimen: BLOOD  Result Value Ref Range Status   Specimen Description BLOOD RIGHT ANTECUBITAL  Final   Special Requests   Final    BOTTLES DRAWN AEROBIC AND ANAEROBIC Blood Culture adequate volume   Culture   Final    NO GROWTH 3 DAYS Performed at [facility], [address] [place], [place] [address]    Report Status PENDING  Incomplete  Blood culture  (routine x 2)     Status: None (Preliminary result)   Collection Time: 12/06/19 11:50 AM   Specimen: BLOOD  Result Value Ref Range Status   Specimen Description BLOOD RIGHT ARM  Final   Special Requests   Final    BOTTLES DRAWN AEROBIC AND ANAEROBIC Blood Culture adequate volume   Culture   Final    NO GROWTH 3 DAYS Performed at [facility], [address], [place], [place] [address]    Report Status PENDING  Incomplete  Urine culture     Status: Abnormal (Preliminary result)   Collection Time: 12/06/19  2:06 PM   Specimen: Urine, Catheterized  Result Value Ref Range Status   Specimen Description   Final    URINE, CATHETERIZED Performed at [facility], [address], [place], [place] [address]    Special Requests   Final    NONE Performed at [facility], [address] [place], [place] [address]    Culture (A)  Final    >=100,000 COLONIES/mL PSEUDOMONAS AERUGINOSA >=100,000 COLONIES/mL ENTEROCOCCUS FAECALIS SUSCEPTIBILITIES TO FOLLOW Performed at [facility], [place] [address], [place], [place] [address]    Report Status PENDING  Incomplete   Organism ID, Bacteria PSEUDOMONAS AERUGINOSA (A)  Final      Susceptibility   Pseudomonas aeruginosa - MIC*    CEFTAZIDIME 4 SENSITIVE Sensitive     CIPROFLOXACIN <=0.25 SENSITIVE Sensitive     GENTAMICIN <=1 SENSITIVE Sensitive     IMIPENEM 1 SENSITIVE Sensitive     PIP/TAZO 8 SENSITIVE Sensitive     CEFEPIME 2 SENSITIVE Sensitive     * >=100,000 COLONIES/mL PSEUDOMONAS AERUGINOSA  Resp Panel by  RT-PCR (Flu A&B, Covid) Nasopharyngeal Swab     Status: None   Collection Time: 12/06/19  2:07 PM   Specimen: Nasopharyngeal Swab; Nasopharyngeal(NP) swabs in vial transport medium  Result Value Ref Range Status   SARS Coronavirus 2 by RT PCR NEGATIVE NEGATIVE Final    Comment: (NOTE) SARS-CoV-2 target nucleic acids are NOT DETECTED.  The SARS-CoV-2 RNA is generally detectable in upper respiratory specimens during the acute phase  of infection. The lowest concentration of SARS-CoV-2 viral copies this assay can detect is 138 copies/mL. A negative result does not preclude SARS-Cov-2 infection and should not be used as the sole basis for treatment or other patient management decisions. A negative result may occur with  improper specimen collection/handling, submission of specimen other than nasopharyngeal swab, presence of viral mutation(s) within the areas targeted by this assay, and inadequate number of viral copies(<138 copies/mL). A negative result must be combined with clinical observations, patient history, and epidemiological information. The expected result is Negative.  Fact Sheet for Patients:  EntrepreneurPulse.com.au  Fact Sheet for Healthcare Providers:  IncredibleEmployment.be  This test is no t yet approved or cleared by the [place] FDA and  has been authorized for detection and/or diagnosis of SARS-CoV-2 by FDA under an Emergency Use Authorization (EUA). This EUA will remain  in effect (meaning this test can be used) for the duration of the COVID-19 declaration under Section 564(b)(1) of the Act, 21 U.S.C.section 360bbb-3(b)(1), unless the authorization is terminated  or revoked sooner.       Influenza A by PCR NEGATIVE NEGATIVE Final   Influenza B by PCR NEGATIVE NEGATIVE Final    Comment: (NOTE) The Xpert Xpress SARS-CoV-2/FLU/RSV plus assay is intended as an aid in the diagnosis of influenza from Nasopharyngeal swab specimens and should not be used as a sole basis for treatment. Nasal washings and aspirates are unacceptable for Xpert Xpress SARS-CoV-2/FLU/RSV testing.  Fact Sheet for Patients: EntrepreneurPulse.com.au  Fact Sheet for Healthcare Providers: IncredibleEmployment.be  This test is not yet approved or cleared by the [place] FDA and has been authorized for detection and/or diagnosis of  SARS-CoV-2 by FDA under an Emergency Use Authorization (EUA). This EUA will remain in effect (meaning this test can be used) for the duration of the COVID-19 declaration under Section 564(b)(1) of the Act, 21 U.S.C. section 360bbb-3(b)(1), unless the authorization is terminated or revoked.  Performed at [facility], [address], [place], [place] [address]   MRSA PCR Screening     Status: Abnormal   Collection Time: 12/07/19  8:31 AM   Specimen: Nasal Mucosa; Nasopharyngeal  Result Value Ref Range Status   MRSA by PCR POSITIVE (A) NEGATIVE Final    Comment:        The GeneXpert MRSA Assay (FDA approved for NASAL specimens only), is one component of a comprehensive MRSA colonization surveillance program. It is not intended to diagnose MRSA infection nor to guide or monitor treatment for MRSA infections. RESULT CALLED TO, READ BACK BY AND VERIFIED WITH: DEAN,T AT 1140 ON 12/07/19 BY HUFFINES,S. Performed at [facility], [address] [place], [place] [address]      Studies:  DG CHEST PORT 1 VIEW  Result Date: 12/08/2019 CLINICAL DATA:  Severe sepsis.  Pneumonia. EXAM: PORTABLE CHEST 1 VIEW COMPARISON:  One-view chest x-ray 12/06/2019. FINDINGS: Heart is enlarged. Increasing interstitial edema is present. Bilateral effusions are increasing, left greater than right. Bibasilar airspace disease is now present. IMPRESSION: 1. Cardiomegaly with increasing interstitial edema and  bilateral effusions compatible with congestive heart failure. 2. Bibasilar airspace disease likely reflects atelectasis. Electronically Signed   By: San Morelle M.D.   On: 12/08/2019 07:44       [place]   [facility] If 7PM-7AM, please contact night-coverage at [email or web address], Office  [phone]   12/09/2019, 3:22 PM  LOS: 3 days

## 2019-12-09 NOTE — Progress Notes (Signed)
Pharmacy Antibiotic Note  [name] is a 86 y.o. male admitted on 12/06/2019 with unknown source of infection.  Pharmacy has been consulted for Vancomycin and Cefepime dosing. Concerned with PNA. UCX+ Pseudomonas A., on cefepime. Will f/u sensitivities. WBC improving  Plan: Continue Vancomycin 1500 mg IV every 48 hours. Expected AUC 511. Continue Cefepime 1gm IV every 24 hours. Monitor labs, c/s, and vanco level as indicated.  Height: 5\' 8"  (172.7 cm) Weight: 99.7 kg (219 lb 12.8 oz) IBW/kg (Calculated) : 68.4  Temp (24hrs), Avg:97.7 F (36.5 C), Min:97.5 F (36.4 C), Max:98 F (36.7 C)  Recent Labs  Lab 12/05/19 1155 12/06/19 1144 12/06/19 1342 12/07/19 0515 12/08/19 0800 12/09/19 0649  WBC 16.6* 22.6*  --  15.9* 11.7*  --   CREATININE 2.84* 2.53*  --  2.41* 2.35* 2.57*  LATICACIDVEN  --  1.8 1.7  --   --   --     Estimated Creatinine Clearance: 21.9 mL/min (A) (by C-G formula based on SCr of 2.57 mg/dL (H)).    Allergies  Allergen Reactions  . Iodinated Diagnostic Agents Other (See Comments)    caused Fever  . Dye Fdc Red [Red Dye] Hives    Tolerated red Docusate, Niferex    Antimicrobials this admission: Vanco 11/22 >>  Cefepime 11/22 >>   Microbiology results: 11/22 BCx: ngtd 11/22 UCx: > 100K CFU/ml Pseudomonas Aeruginosa 11/23 MRSA PCR is positive   Thank you for allowing pharmacy to be a part of this patient's care.  Isac Sarna, BS [place], [place] Clinical Pharmacist Pager [phone] 12/09/2019 9:33 AM

## 2019-12-10 DIAGNOSIS — A419 Sepsis, unspecified organism: Secondary | ICD-10-CM | POA: Diagnosis not present

## 2019-12-10 DIAGNOSIS — I10 Essential (primary) hypertension: Secondary | ICD-10-CM | POA: Diagnosis not present

## 2019-12-10 DIAGNOSIS — G9341 Metabolic encephalopathy: Secondary | ICD-10-CM | POA: Diagnosis not present

## 2019-12-10 DIAGNOSIS — I5032 Chronic diastolic (congestive) heart failure: Secondary | ICD-10-CM | POA: Diagnosis not present

## 2019-12-10 LAB — URINE CULTURE: Culture: >=100000 — AB

## 2019-12-10 LAB — BASIC METABOLIC PANEL
Anion gap: 11 (ref 5–15)
BUN: 50 mg/dL — ABNORMAL HIGH (ref 8–23)
CO2: 23 mmol/L (ref 22–32)
Calcium: 8.5 mg/dL — ABNORMAL LOW (ref 8.9–10.3)
Chloride: 100 mmol/L (ref 98–111)
Creatinine, Ser: 2.68 mg/dL — ABNORMAL HIGH (ref 0.61–1.24)
GFR, Estimated: 22 mL/min — ABNORMAL LOW (ref >60)
Glucose, Bld: 194 mg/dL — ABNORMAL HIGH (ref 70–99)
Potassium: 3.7 mmol/L (ref 3.5–5.1)
Sodium: 134 mmol/L — ABNORMAL LOW (ref 135–145)

## 2019-12-10 LAB — GLUCOSE, CAPILLARY
Glucose-Capillary: 177 mg/dL — ABNORMAL HIGH (ref 70–99)
Glucose-Capillary: 211 mg/dL — ABNORMAL HIGH (ref 70–99)
Glucose-Capillary: 199 mg/dL — ABNORMAL HIGH (ref 70–99)
Glucose-Capillary: 202 mg/dL — ABNORMAL HIGH (ref 70–99)

## 2019-12-10 NOTE — Progress Notes (Addendum)
[facility]  PROGRESS NOTE  [name] HYW:[id number] DOB: 05-15-1927 DOA: 12/06/2019 PCP: Redmond School, MD   Brief HPI:   86 year old male with a history of diabetes mellitus type 2, hypertension, hyperlipidemia, PAD s/p bilateral BKA, aortic stenosis, PAF, diastolic CHF, CKD stage IV, OSA not on CPAP, bradycardia, hypothyroidism, CAD NSTEMI in October 2021 was admitted on 11-21 with sepsis with pneumonia.   Subjective   Patient seen and examined, resting comfortably.   Assessment/Plan:     1. Sepsis due to pneumonia-patient met sepsis care evaluation with leukocytosis, fever, tachypnea.  Continue vancomycin and cefepime. 2. UTI-urine culture grew Pseudomonas, Enterococcus faecalis.  Continue cefepime and vancomycin. 3. Acute pulmonary edema-patient had recent NSTEMI in October 21.  Also echocardiogram showed chronic diastolic dysfunction with a EF 55 to 60%.  Troponin is elevated today 126.  Patient was not found to be candidate for intervention due to CKD stage IV.  Continue Lipitor, Coreg and Plavix.  He diuresed well with IV Lasix 40 mg x 1.  Continue  furosemide 40 mg p.o. daily.  Continue  morphine 0.5 mg IV every 4 hours as needed for dyspnea. 4. CKD stage IV-creatinine is currently on 2.68, which is little bit elevated due to  Lasix.  Follow BMP in am.  5. Hypothyroidism-continue Synthroid 75 mcg daily. 6. Diabetes mellitus type 2-hemoglobin A1c is 8.5 reflecting uncontrolled diabetes mellitus with hyperglycemia.  Glipizide on hold.  Continue sliding scale insulin NovoLog. 7. Chronic atrial fibrillation-continue apixaban, Coreg for rate control. 8. Hypertension-blood pressure is mildly elevated, continue amlodipine, will increase dose of Coreg to 6.25 mg p.o. twice daily. 9. Goals of care-palliative care consulted, patient is partial code, no intubation.  If patient continues to decline he may be eligible for hospice at home.     COVID-19 Labs  No results for  input(s): DDIMER, FERRITIN, LDH, CRP in the last 72 hours.  Lab Results  Component Value Date   SARSCOV2NAA NEGATIVE 12/06/2019   [place] NEGATIVE 10/05/2019   [place] NEGATIVE 02/02/2019   [place] NEGATIVE 01/18/2019     Scheduled medications:   . amLODipine  10 mg Oral Daily  . apixaban  2.5 mg Oral BID  . vitamin C  1,000 mg Oral Daily  . atorvastatin  40 mg Oral QPM  . carvedilol  6.25 mg Oral BID WC  . Chlorhexidine Gluconate Cloth  6 each Topical Daily  . cholecalciferol  5,000 Units Oral QPM  . clopidogrel  75 mg Oral Q breakfast  . dorzolamide-timolol  1 drop Both Eyes BID  . famotidine  20 mg Oral Daily  . ferrous sulfate  325 mg Oral BID WC  . finasteride  5 mg Oral QPM  . furosemide  40 mg Oral Daily  . insulin aspart  0-5 Units Subcutaneous QHS  . insulin aspart  0-9 Units Subcutaneous TID WC  . latanoprost  1 drop Both Eyes QHS  . levothyroxine  75 mcg Oral QAC breakfast  . mupirocin ointment  1 application Nasal BID  . pyridOXINE  100 mg Oral Daily  . sodium chloride flush  3 mL Intravenous Q12H  . sodium chloride flush  3 mL Intravenous Q12H  . vitamin E  400 Units Oral Daily         CBG: Recent Labs  Lab 12/09/19 1119 12/09/19 1806 12/09/19 2128 12/10/19 0723 12/10/19 1119  GLUCAP 237* 275* 237* 177* 211*    SpO2: 98 % O2 Flow Rate (L/min): 3.5 L/min FiO2 (%): [name])  3 %    CBC: Recent Labs  Lab 12/05/19 1155 12/06/19 1144 12/07/19 0515 12/08/19 0800  WBC 16.6* 22.6* 15.9* 11.7*  NEUTROABS  --  17.8*  --   --   HGB 12.5* 12.7* 10.9* 11.7*  HCT 36.7* 37.1* 32.4* 35.3*  MCV 95.8 95.4 95.9 97.2  PLT 233 220 191 784    Basic Metabolic Panel: Recent Labs  Lab 12/06/19 1144 12/07/19 0515 12/08/19 0800 12/09/19 0649 12/10/19 0823  NA 130* 134* 133* 135 134*  K 3.6 3.3* 4.1 4.0 3.7  CL 94* 98 103 102 100  CO2 23 25 22 22 23   GLUCOSE 192* 184* 196* 182* 194*  BUN 49* 47* 48* 49* 50*  CREATININE 2.53* 2.41* 2.35*  2.57* 2.68*  CALCIUM 8.9 8.4* 8.6* 8.8* 8.5*     Liver Function Tests: Recent Labs  Lab 12/06/19 1144  AST 26  ALT 23  ALKPHOS 48  BILITOT 1.0  PROT 7.6  ALBUMIN 3.6     Antibiotics: Anti-infectives (From admission, onward)   Start     Dose/Rate Route Frequency Ordered Stop   12/08/19 1400  vancomycin (VANCOREADY) IVPB 1500 mg/300 mL        1,500 mg 150 mL/hr over 120 Minutes Intravenous Every 48 hours 12/06/19 1339     12/07/19 1400  ceFEPIme (MAXIPIME) 1 g in sodium chloride 0.9 % 100 mL IVPB        1 g 200 mL/hr over 30 Minutes Intravenous Every 24 hours 12/06/19 1315     12/06/19 1315  ceFEPIme (MAXIPIME) 2 g in sodium chloride 0.9 % 100 mL IVPB        2 g 200 mL/hr over 30 Minutes Intravenous  Once 12/06/19 1305 12/06/19 1402   12/06/19 1315  vancomycin (VANCOCIN) IVPB 1000 mg/200 mL premix  Status:  Discontinued        1,000 mg 200 mL/hr over 60 Minutes Intravenous  Once 12/06/19 1305 12/06/19 1309   12/06/19 1315  vancomycin (VANCOREADY) IVPB 2000 mg/400 mL        2,000 mg 200 mL/hr over 120 Minutes Intravenous  Once 12/06/19 1309 12/06/19 1619       DVT prophylaxis: Apixaban  Code Status: Partial code  Family Communication: No family at bedside   Consultants:    Procedures:      Objective   Vitals:   12/09/19 2127 12/10/19 0525 12/10/19 0931 12/10/19 1429  BP: (!) 168/74 (!) 178/69 (!) 172/68 (!) 149/62  Pulse: 62 (!) 56 (!) 58 (!) 57  Resp: 20 16  16   Temp: 98.6 F (37 C) 98.4 F (36.9 C)  98.6 F (37 C)  TempSrc:    Axillary  SpO2: 97% 99%  98%  Weight:      Height:        Intake/Output Summary (Last 24 hours) at 12/10/2019 1455 Last data filed at 12/10/2019 1430 Gross per 24 hour  Intake 486 ml  Output 3250 ml  Net -2764 ml    11/24 1901 - 11/26 0700 In: -  Out: 3250 [Urine:3250]  Filed Weights   12/06/19 1128 12/06/19 1451 12/08/19 0500  Weight: 98.9 kg 103.3 kg 99.7 kg    Physical Examination:   General-appears  in no acute distress  Heart-S1-S2, regular, no murmur auscultated  Lungs-clear to auscultation bilaterally, no wheezing or crackles auscultated  Abdomen-soft, nontender, no organomegaly  Extremities-no edema in the lower extremities  Neuro-alert, oriented x3, no focal deficit noted   Status is: Inpatient  Dispo: The patient is from: Home              Anticipated d/c is to: To be decided              Anticipated d/c date is: 12/13/2019              Patient currently not medically stable for discharge  Barrier to discharge-ongoing treatment for pulmonary edema  Pressure Injury 02/03/19 Buttocks Right Stage 1 -  Intact skin with non-blanchable redness of a localized area usually over a bony prominence. (Active)  02/03/19 0020  Location: Buttocks  Location Orientation: Right  Staging: Stage 1 -  Intact skin with non-blanchable redness of a localized area usually over a bony prominence.  Wound Description (Comments):   Present on Admission: Yes     Pressure Injury 02/03/19 Buttocks Left Stage 2 -  Partial thickness loss of dermis presenting as a shallow open injury with a red, pink wound bed without slough. (Active)  02/03/19 1707  Location: Buttocks  Location Orientation: Left  Staging: Stage 2 -  Partial thickness loss of dermis presenting as a shallow open injury with a red, pink wound bed without slough.  Wound Description (Comments):   Present on Admission: Yes           Data Reviewed:   Recent Results (from the past 240 hour(s))  Blood culture (routine x 2)     Status: None (Preliminary result)   Collection Time: 12/06/19 11:45 AM   Specimen: BLOOD  Result Value Ref Range Status   Specimen Description BLOOD RIGHT ANTECUBITAL  Final   Special Requests   Final    BOTTLES DRAWN AEROBIC AND ANAEROBIC Blood Culture adequate volume   Culture   Final    NO GROWTH 4 DAYS Performed at [facility], [address] [place], [place] [address]    Report Status PENDING   Incomplete  Blood culture (routine x 2)     Status: None (Preliminary result)   Collection Time: 12/06/19 11:50 AM   Specimen: BLOOD  Result Value Ref Range Status   Specimen Description BLOOD RIGHT ARM  Final   Special Requests   Final    BOTTLES DRAWN AEROBIC AND ANAEROBIC Blood Culture adequate volume   Culture   Final    NO GROWTH 4 DAYS Performed at [facility], [address], [place], [place] [address]    Report Status PENDING  Incomplete  Urine culture     Status: Abnormal   Collection Time: 12/06/19  2:06 PM   Specimen: Urine, Catheterized  Result Value Ref Range Status   Specimen Description   Final    URINE, CATHETERIZED Performed at [facility], [address], [place], [place] [address]    Special Requests   Final    NONE Performed at [facility], [address], [place], [place] [address]    Culture (A)  Final    >=100,000 COLONIES/mL PSEUDOMONAS AERUGINOSA >=100,000 COLONIES/mL ENTEROCOCCUS FAECALIS    Report Status 12/10/2019 FINAL  Final   Organism ID, Bacteria PSEUDOMONAS AERUGINOSA (A)  Final   Organism ID, Bacteria ENTEROCOCCUS FAECALIS (A)  Final      Susceptibility   Enterococcus faecalis - MIC*    AMPICILLIN <=2 SENSITIVE Sensitive     NITROFURANTOIN <=16 SENSITIVE Sensitive     VANCOMYCIN 1 SENSITIVE Sensitive     * >=100,000 COLONIES/mL ENTEROCOCCUS FAECALIS   Pseudomonas aeruginosa - MIC*    CEFTAZIDIME 4 SENSITIVE Sensitive  CIPROFLOXACIN <=0.25 SENSITIVE Sensitive     GENTAMICIN <=1 SENSITIVE Sensitive     IMIPENEM 1 SENSITIVE Sensitive     PIP/TAZO 8 SENSITIVE Sensitive     CEFEPIME 2 SENSITIVE Sensitive     * >=100,000 COLONIES/mL PSEUDOMONAS AERUGINOSA  Resp Panel by RT-PCR (Flu A&B, Covid) Nasopharyngeal Swab     Status: None   Collection Time: 12/06/19  2:07 PM   Specimen: Nasopharyngeal Swab; Nasopharyngeal(NP) swabs in vial transport medium  Result Value Ref Range Status   SARS Coronavirus 2 by RT PCR NEGATIVE NEGATIVE Final     Comment: (NOTE) SARS-CoV-2 target nucleic acids are NOT DETECTED.  The SARS-CoV-2 RNA is generally detectable in upper respiratory specimens during the acute phase of infection. The lowest concentration of SARS-CoV-2 viral copies this assay can detect is 138 copies/mL. A negative result does not preclude SARS-Cov-2 infection and should not be used as the sole basis for treatment or other patient management decisions. A negative result may occur with  improper specimen collection/handling, submission of specimen other than nasopharyngeal swab, presence of viral mutation(s) within the areas targeted by this assay, and inadequate number of viral copies(<138 copies/mL). A negative result must be combined with clinical observations, patient history, and epidemiological information. The expected result is Negative.  Fact Sheet for Patients:  EntrepreneurPulse.com.au  Fact Sheet for Healthcare Providers:  IncredibleEmployment.be  This test is no t yet approved or cleared by the [place] FDA and  has been authorized for detection and/or diagnosis of SARS-CoV-2 by FDA under an Emergency Use Authorization (EUA). This EUA will remain  in effect (meaning this test can be used) for the duration of the COVID-19 declaration under Section 564(b)(1) of the Act, 21 U.S.C.section 360bbb-3(b)(1), unless the authorization is terminated  or revoked sooner.       Influenza A by PCR NEGATIVE NEGATIVE Final   Influenza B by PCR NEGATIVE NEGATIVE Final    Comment: (NOTE) The Xpert Xpress SARS-CoV-2/FLU/RSV plus assay is intended as an aid in the diagnosis of influenza from Nasopharyngeal swab specimens and should not be used as a sole basis for treatment. Nasal washings and aspirates are unacceptable for Xpert Xpress SARS-CoV-2/FLU/RSV testing.  Fact Sheet for Patients: EntrepreneurPulse.com.au  Fact Sheet for Healthcare  Providers: IncredibleEmployment.be  This test is not yet approved or cleared by the [place] FDA and has been authorized for detection and/or diagnosis of SARS-CoV-2 by FDA under an Emergency Use Authorization (EUA). This EUA will remain in effect (meaning this test can be used) for the duration of the COVID-19 declaration under Section 564(b)(1) of the Act, 21 U.S.C. section 360bbb-3(b)(1), unless the authorization is terminated or revoked.  Performed at [facility], [address], [place], [place] [address]   MRSA PCR Screening     Status: Abnormal   Collection Time: 12/07/19  8:31 AM   Specimen: Nasal Mucosa; Nasopharyngeal  Result Value Ref Range Status   MRSA by PCR POSITIVE (A) NEGATIVE Final    Comment:        The GeneXpert MRSA Assay (FDA approved for NASAL specimens only), is one component of a comprehensive MRSA colonization surveillance program. It is not intended to diagnose MRSA infection nor to guide or monitor treatment for MRSA infections. RESULT CALLED TO, READ BACK BY AND VERIFIED WITH: DEAN,T AT 1140 ON 12/07/19 BY HUFFINES,S. Performed at [facility], [address] [place], [place] [address]      Studies:  No results found.     Marge Duncans  [facility] If 7PM-7AM, please contact night-coverage at [email or web address], Office  [phone]   12/10/2019, 2:55 PM  LOS: 4 days

## 2019-12-11 DIAGNOSIS — J810 Acute pulmonary edema: Secondary | ICD-10-CM

## 2019-12-11 DIAGNOSIS — I10 Essential (primary) hypertension: Secondary | ICD-10-CM | POA: Diagnosis not present

## 2019-12-11 DIAGNOSIS — A419 Sepsis, unspecified organism: Secondary | ICD-10-CM | POA: Diagnosis not present

## 2019-12-11 DIAGNOSIS — G9341 Metabolic encephalopathy: Secondary | ICD-10-CM | POA: Diagnosis not present

## 2019-12-11 DIAGNOSIS — I5032 Chronic diastolic (congestive) heart failure: Secondary | ICD-10-CM | POA: Diagnosis not present

## 2019-12-11 LAB — CULTURE, BLOOD (ROUTINE X 2)
Culture: NO GROWTH
Culture: NO GROWTH
Special Requests: ADEQUATE
Special Requests: ADEQUATE

## 2019-12-11 LAB — GLUCOSE, CAPILLARY
Glucose-Capillary: 167 mg/dL — ABNORMAL HIGH (ref 70–99)
Glucose-Capillary: 231 mg/dL — ABNORMAL HIGH (ref 70–99)
Glucose-Capillary: 255 mg/dL — ABNORMAL HIGH (ref 70–99)
Glucose-Capillary: 263 mg/dL — ABNORMAL HIGH (ref 70–99)

## 2019-12-11 LAB — BASIC METABOLIC PANEL
Anion gap: 10 (ref 5–15)
BUN: 50 mg/dL — ABNORMAL HIGH (ref 8–23)
CO2: 24 mmol/L (ref 22–32)
Calcium: 8.8 mg/dL — ABNORMAL LOW (ref 8.9–10.3)
Chloride: 98 mmol/L (ref 98–111)
Creatinine, Ser: 2.53 mg/dL — ABNORMAL HIGH (ref 0.61–1.24)
GFR, Estimated: 23 mL/min — ABNORMAL LOW (ref >60)
Glucose, Bld: 179 mg/dL — ABNORMAL HIGH (ref 70–99)
Potassium: 3.6 mmol/L (ref 3.5–5.1)
Sodium: 132 mmol/L — ABNORMAL LOW (ref 135–145)

## 2019-12-11 NOTE — Progress Notes (Signed)
[facility]  PROGRESS NOTE  [name] OMA:[id number] DOB: 1927-11-14 DOA: 12/06/2019 PCP: Redmond School, MD   Brief HPI:   86 year old male with a history of diabetes mellitus type 2, hypertension, hyperlipidemia, PAD s/p bilateral BKA, aortic stenosis, PAF, diastolic CHF, CKD stage IV, OSA not on CPAP, bradycardia, hypothyroidism, CAD NSTEMI in October 2021 was admitted on 11-21 with sepsis with pneumonia.   Subjective   Patient seen and examined, no new complaints.  Breathing is improved.   Assessment/Plan:     1. Sepsis due to pneumonia-patient met sepsis criteria evaluation with leukocytosis, fever, tachypnea.  Continue vancomycin and cefepime. 2. UTI-urine culture grew Pseudomonas, Enterococcus faecalis.  Continue cefepime and vancomycin. 3. Acute pulmonary edema-patient had recent NSTEMI in October 21.  Also echocardiogram showed chronic diastolic dysfunction with a EF 55 to 60%.  Troponin is elevated today 126.  Patient was not found to be candidate for intervention due to CKD stage IV.  Continue Lipitor, Coreg and Plavix.  He diuresed well with IV Lasix 40 mg x 1.  Continue  furosemide 40 mg p.o. daily.  Continue  morphine 0.5 mg IV every 4 hours as needed for dyspnea. 4. CKD stage IV-creatinine is 2.53.  little improved from yesterday.  Patient is on p.o. Lasix.  Follow BMP in am.  5. Hypothyroidism-continue Synthroid 75 mcg daily. 6. Diabetes mellitus type 2-hemoglobin A1c is 8.5 reflecting uncontrolled diabetes mellitus with hyperglycemia.  Glipizide on hold.  Continue sliding scale insulin NovoLog. 7. Chronic atrial fibrillation-continue apixaban, Coreg for rate control. 8. Hypertension-blood pressure is better controlled after adjusting medications. Continue amlodipine 10 mg daily, Coreg 6.25 mg p.o. twice daily. 9. Goals of care-palliative care consulted, patient is partial code, no intubation.  If patient continues to decline he may be eligible for hospice at  home.     COVID-19 Labs  No results for input(s): DDIMER, FERRITIN, LDH, CRP in the last 72 hours.  Lab Results  Component Value Date   SARSCOV2NAA NEGATIVE 12/06/2019   [place] NEGATIVE 10/05/2019   [place] NEGATIVE 02/02/2019   [place] NEGATIVE 01/18/2019     Scheduled medications:   . amLODipine  10 mg Oral Daily  . apixaban  2.5 mg Oral BID  . vitamin C  1,000 mg Oral Daily  . atorvastatin  40 mg Oral QPM  . carvedilol  6.25 mg Oral BID WC  . Chlorhexidine Gluconate Cloth  6 each Topical Daily  . cholecalciferol  5,000 Units Oral QPM  . clopidogrel  75 mg Oral Q breakfast  . dorzolamide-timolol  1 drop Both Eyes BID  . famotidine  20 mg Oral Daily  . ferrous sulfate  325 mg Oral BID WC  . finasteride  5 mg Oral QPM  . furosemide  40 mg Oral Daily  . insulin aspart  0-5 Units Subcutaneous QHS  . insulin aspart  0-9 Units Subcutaneous TID WC  . latanoprost  1 drop Both Eyes QHS  . levothyroxine  75 mcg Oral QAC breakfast  . mupirocin ointment  1 application Nasal BID  . pyridOXINE  100 mg Oral Daily  . sodium chloride flush  3 mL Intravenous Q12H  . sodium chloride flush  3 mL Intravenous Q12H  . vitamin E  400 Units Oral Daily         CBG: Recent Labs  Lab 12/10/19 1119 12/10/19 1629 12/10/19 2131 12/11/19 0722 12/11/19 1112  GLUCAP 211* 199* 202* 167* 231*    SpO2: 99 % O2 Flow Rate (  L/min): 3 L/min FiO2 (%): (!) 3 %    CBC: Recent Labs  Lab 12/05/19 1155 12/06/19 1144 12/07/19 0515 12/08/19 0800  WBC 16.6* 22.6* 15.9* 11.7*  NEUTROABS  --  17.8*  --   --   HGB 12.5* 12.7* 10.9* 11.7*  HCT 36.7* 37.1* 32.4* 35.3*  MCV 95.8 95.4 95.9 97.2  PLT 233 220 191 101    Basic Metabolic Panel: Recent Labs  Lab 12/07/19 0515 12/08/19 0800 12/09/19 0649 12/10/19 0823 12/11/19 0858  NA 134* 133* 135 134* 132*  K 3.3* 4.1 4.0 3.7 3.6  CL 98 103 102 100 98  CO2 _0 GLUCOSE 184* 196* 182* 194* 179*  BUN 47* 48* 49*  50* 50*  CREATININE 2.41* 2.35* 2.57* 2.68* 2.53*  CALCIUM 8.4* 8.6* 8.8* 8.5* 8.8*     Liver Function Tests: Recent Labs  Lab 12/06/19 1144  AST 26  ALT 23  ALKPHOS 48  BILITOT 1.0  PROT 7.6  ALBUMIN 3.6     Antibiotics: Anti-infectives (From admission, onward)   Start     Dose/Rate Route Frequency Ordered Stop   12/08/19 1400  vancomycin (VANCOREADY) IVPB 1500 mg/300 mL        1,500 mg 150 mL/hr over 120 Minutes Intravenous Every 48 hours 12/06/19 1339     12/07/19 1400  ceFEPIme (MAXIPIME) 1 g in sodium chloride 0.9 % 100 mL IVPB        1 g 200 mL/hr over 30 Minutes Intravenous Every 24 hours 12/06/19 1315     12/06/19 1315  ceFEPIme (MAXIPIME) 2 g in sodium chloride 0.9 % 100 mL IVPB        2 g 200 mL/hr over 30 Minutes Intravenous  Once 12/06/19 1305 12/06/19 1402   12/06/19 1315  vancomycin (VANCOCIN) IVPB 1000 mg/200 mL premix  Status:  Discontinued        1,000 mg 200 mL/hr over 60 Minutes Intravenous  Once 12/06/19 1305 12/06/19 1309   12/06/19 1315  vancomycin (VANCOREADY) IVPB 2000 mg/400 mL        2,000 mg 200 mL/hr over 120 Minutes Intravenous  Once 12/06/19 1309 12/06/19 1619       DVT prophylaxis: Apixaban  Code Status: Partial code  Family Communication: Discussed with patient's son at bedside   Consultants:    Procedures:      Objective   Vitals:   12/11/19 0500 12/11/19 0607 12/11/19 0821 12/11/19 1441  BP:  (!) 174/85 (!) 160/89 (!) 156/68  Pulse:  (!) 56 (!) 54 (!) 52  Resp:  _1 Temp:  98.1 F (36.7 C)  98.5 F (36.9 C)  TempSrc:      SpO2:  99% 98% 99%  Weight: 95 kg     Height:        Intake/Output Summary (Last 24 hours) at 12/11/2019 1457 Last data filed at 12/11/2019 0900 Gross per 24 hour  Intake 395 ml  Output 950 ml  Net -555 ml    11/25 1901 - 11/27 0700 In: 761 [P.O.:480; I.V.:6] Out: 4200 [Urine:4200]  Filed Weights   12/06/19 1451 12/08/19 0500 12/11/19 0500  Weight: 103.3 kg 99.7 kg 95 kg     Physical Examination:   General-appears in no acute distress  Heart-S1-S2, regular, no murmur auscultated  Lungs-clear to auscultation bilaterally, no wheezing or crackles auscultated  Abdomen-soft, nontender, no organomegaly  Extremities-no edema in the lower extremities  Neuro-alert, oriented x3, no focal deficit noted  Status is: Inpatient  Dispo: The patient is from: Home              Anticipated d/c is to: To be decided              Anticipated d/c date is: 12/13/2019              Patient currently not medically stable for discharge  Barrier to discharge-ongoing treatment for pulmonary edema  Pressure Injury 02/03/19 Buttocks Right Stage 1 -  Intact skin with non-blanchable redness of a localized area usually over a bony prominence. (Active)  02/03/19 0020  Location: Buttocks  Location Orientation: Right  Staging: Stage 1 -  Intact skin with non-blanchable redness of a localized area usually over a bony prominence.  Wound Description (Comments):   Present on Admission: Yes     Pressure Injury 02/03/19 Buttocks Left Stage 2 -  Partial thickness loss of dermis presenting as a shallow open injury with a red, pink wound bed without slough. (Active)  02/03/19 1707  Location: Buttocks  Location Orientation: Left  Staging: Stage 2 -  Partial thickness loss of dermis presenting as a shallow open injury with a red, pink wound bed without slough.  Wound Description (Comments):   Present on Admission: Yes           Data Reviewed:   Recent Results (from the past 240 hour(s))  Blood culture (routine x 2)     Status: None   Collection Time: 12/06/19 11:45 AM   Specimen: BLOOD  Result Value Ref Range Status   Specimen Description BLOOD RIGHT ANTECUBITAL  Final   Special Requests   Final    BOTTLES DRAWN AEROBIC AND ANAEROBIC Blood Culture adequate volume   Culture   Final    NO GROWTH 5 DAYS Performed at [facility], [address] [place], [place]  [address]    Report Status 12/11/2019 FINAL  Final  Blood culture (routine x 2)     Status: None   Collection Time: 12/06/19 11:50 AM   Specimen: BLOOD  Result Value Ref Range Status   Specimen Description BLOOD RIGHT ARM  Final   Special Requests   Final    BOTTLES DRAWN AEROBIC AND ANAEROBIC Blood Culture adequate volume   Culture   Final    NO GROWTH 5 DAYS Performed at [facility], [address], [place], [place] [address]    Report Status 12/11/2019 FINAL  Final  Urine culture     Status: Abnormal   Collection Time: 12/06/19  2:06 PM   Specimen: Urine, Catheterized  Result Value Ref Range Status   Specimen Description   Final    URINE, CATHETERIZED Performed at [facility], [address] [place], [place] [address]    Special Requests   Final    NONE Performed at [facility], [address], [place], [place] [address]    Culture (A)  Final    >=100,000 COLONIES/mL PSEUDOMONAS AERUGINOSA >=100,000 COLONIES/mL ENTEROCOCCUS FAECALIS    Report Status 12/10/2019 FINAL  Final   Organism ID, Bacteria PSEUDOMONAS AERUGINOSA (A)  Final   Organism ID, Bacteria ENTEROCOCCUS FAECALIS (A)  Final      Susceptibility   Enterococcus faecalis - MIC*    AMPICILLIN <=2 SENSITIVE Sensitive     NITROFURANTOIN <=16 SENSITIVE Sensitive     VANCOMYCIN 1 SENSITIVE Sensitive     * >=100,000 COLONIES/mL ENTEROCOCCUS FAECALIS   Pseudomonas aeruginosa - MIC*    CEFTAZIDIME 4 SENSITIVE Sensitive  CIPROFLOXACIN <=0.25 SENSITIVE Sensitive     GENTAMICIN <=1 SENSITIVE Sensitive     IMIPENEM 1 SENSITIVE Sensitive     PIP/TAZO 8 SENSITIVE Sensitive     CEFEPIME 2 SENSITIVE Sensitive     * >=100,000 COLONIES/mL PSEUDOMONAS AERUGINOSA  Resp Panel by RT-PCR (Flu A&B, Covid) Nasopharyngeal Swab     Status: None   Collection Time: 12/06/19  2:07 PM   Specimen: Nasopharyngeal Swab; Nasopharyngeal(NP) swabs in vial transport medium  Result Value Ref Range Status   SARS Coronavirus 2 by RT PCR  NEGATIVE NEGATIVE Final    Comment: (NOTE) SARS-CoV-2 target nucleic acids are NOT DETECTED.  The SARS-CoV-2 RNA is generally detectable in upper respiratory specimens during the acute phase of infection. The lowest concentration of SARS-CoV-2 viral copies this assay can detect is 138 copies/mL. A negative result does not preclude SARS-Cov-2 infection and should not be used as the sole basis for treatment or other patient management decisions. A negative result may occur with  improper specimen collection/handling, submission of specimen other than nasopharyngeal swab, presence of viral mutation(s) within the areas targeted by this assay, and inadequate number of viral copies(<138 copies/mL). A negative result must be combined with clinical observations, patient history, and epidemiological information. The expected result is Negative.  Fact Sheet for Patients:  EntrepreneurPulse.com.au  Fact Sheet for Healthcare Providers:  IncredibleEmployment.be  This test is no t yet approved or cleared by the [place] FDA and  has been authorized for detection and/or diagnosis of SARS-CoV-2 by FDA under an Emergency Use Authorization (EUA). This EUA will remain  in effect (meaning this test can be used) for the duration of the COVID-19 declaration under Section 564(b)(1) of the Act, 21 U.S.C.section 360bbb-3(b)(1), unless the authorization is terminated  or revoked sooner.       Influenza A by PCR NEGATIVE NEGATIVE Final   Influenza B by PCR NEGATIVE NEGATIVE Final    Comment: (NOTE) The Xpert Xpress SARS-CoV-2/FLU/RSV plus assay is intended as an aid in the diagnosis of influenza from Nasopharyngeal swab specimens and should not be used as a sole basis for treatment. Nasal washings and aspirates are unacceptable for Xpert Xpress SARS-CoV-2/FLU/RSV testing.  Fact Sheet for Patients: EntrepreneurPulse.com.au  Fact Sheet for  Healthcare Providers: IncredibleEmployment.be  This test is not yet approved or cleared by the [place] FDA and has been authorized for detection and/or diagnosis of SARS-CoV-2 by FDA under an Emergency Use Authorization (EUA). This EUA will remain in effect (meaning this test can be used) for the duration of the COVID-19 declaration under Section 564(b)(1) of the Act, 21 U.S.C. section 360bbb-3(b)(1), unless the authorization is terminated or revoked.  Performed at [facility], [address], [place], [place] [address]   MRSA PCR Screening     Status: Abnormal   Collection Time: 12/07/19  8:31 AM   Specimen: Nasal Mucosa; Nasopharyngeal  Result Value Ref Range Status   MRSA by PCR POSITIVE (A) NEGATIVE Final    Comment:        The GeneXpert MRSA Assay (FDA approved for NASAL specimens only), is one component of a comprehensive MRSA colonization surveillance program. It is not intended to diagnose MRSA infection nor to guide or monitor treatment for MRSA infections. RESULT CALLED TO, READ BACK BY AND VERIFIED WITH: DEAN,T AT 1140 ON 12/07/19 BY HUFFINES,S. Performed at [facility], [address] [place], [place] [address]      Studies:  No results found.     Marge Duncans  [facility] If 7PM-7AM, please contact night-coverage at [email or web address], Office  [phone]   12/11/2019, 2:57 PM  LOS: 5 days

## 2019-12-12 DIAGNOSIS — I10 Essential (primary) hypertension: Secondary | ICD-10-CM | POA: Diagnosis not present

## 2019-12-12 DIAGNOSIS — A419 Sepsis, unspecified organism: Secondary | ICD-10-CM | POA: Diagnosis not present

## 2019-12-12 DIAGNOSIS — G9341 Metabolic encephalopathy: Secondary | ICD-10-CM | POA: Diagnosis not present

## 2019-12-12 DIAGNOSIS — I5032 Chronic diastolic (congestive) heart failure: Secondary | ICD-10-CM | POA: Diagnosis not present

## 2019-12-12 LAB — BASIC METABOLIC PANEL
Anion gap: 10 (ref 5–15)
BUN: 53 mg/dL — ABNORMAL HIGH (ref 8–23)
CO2: 23 mmol/L (ref 22–32)
Calcium: 8.6 mg/dL — ABNORMAL LOW (ref 8.9–10.3)
Chloride: 98 mmol/L (ref 98–111)
Creatinine, Ser: 2.31 mg/dL — ABNORMAL HIGH (ref 0.61–1.24)
GFR, Estimated: 26 mL/min — ABNORMAL LOW (ref >60)
Glucose, Bld: 242 mg/dL — ABNORMAL HIGH (ref 70–99)
Potassium: 3.4 mmol/L — ABNORMAL LOW (ref 3.5–5.1)
Sodium: 131 mmol/L — ABNORMAL LOW (ref 135–145)

## 2019-12-12 LAB — GLUCOSE, CAPILLARY
Glucose-Capillary: 231 mg/dL — ABNORMAL HIGH (ref 70–99)
Glucose-Capillary: 217 mg/dL — ABNORMAL HIGH (ref 70–99)

## 2019-12-12 MED ORDER — POTASSIUM CHLORIDE CRYS ER 20 MEQ PO TBCR
40.0000 meq | EXTENDED_RELEASE_TABLET | Freq: Once | ORAL | Status: AC
  Administered 2019-12-12: 40 meq via ORAL
  Filled 2019-12-12: qty 2

## 2019-12-12 NOTE — Progress Notes (Signed)
[facility]  PROGRESS NOTE  [name] ZOX:[id number] DOB: 1927-06-29 DOA: 12/06/2019 PCP: Redmond School, MD   Brief HPI:   86 year old male with a history of diabetes mellitus type 2, hypertension, hyperlipidemia, PAD s/p bilateral BKA, aortic stenosis, PAF, diastolic CHF, CKD stage IV, OSA not on CPAP, bradycardia, hypothyroidism, CAD NSTEMI in October 2021 was admitted on 11-21 with sepsis with pneumonia.   Subjective   Patient seen and examined, breathing is improved.  No longer requiring oxygen.   Assessment/Plan:     1. Sepsis due to pneumonia-patient met sepsis criteria evaluation with leukocytosis, fever, tachypnea.  Continue vancomycin and cefepime. 2. UTI-urine culture grew Pseudomonas, Enterococcus faecalis.  Continue cefepime and vancomycin. 3. Acute pulmonary edema-resolved, patient had recent NSTEMI in October 21.  Also echocardiogram showed chronic diastolic dysfunction with a EF 55 to 60%.  Troponin was elevated at  126.  Patient was not found to be candidate for intervention due to CKD stage IV.  Continue Lipitor, Coreg and Plavix.  He diuresed well with IV Lasix 40 mg x 1.  Patient was started on furosemide 40 mg p.o. daily and has diuresed well.  He is net -7.8 L.  Will discontinue furosemide.  Continue  morphine 0.5 mg IV every 4 hours as needed for dyspnea.  Patient is no longer requiring oxygen. 4. CKD stage IV-creatinine is 2.31.  little improved from yesterday.  Patient is on p.o. Lasix.  Follow BMP in am.  5. Hypothyroidism-continue Synthroid 75 mcg daily. 6. Diabetes mellitus type 2-hemoglobin A1c is 8.5 reflecting uncontrolled diabetes mellitus with hyperglycemia.  Glipizide on hold.  Continue sliding scale insulin NovoLog.  CBG fairly well controlled. 7. Chronic atrial fibrillation-continue apixaban, Coreg for rate control. 8. Hypertension-blood pressure is better controlled after adjusting medications. Continue amlodipine 10 mg daily, Coreg 6.25 mg  p.o. twice daily. 9. Goals of care-palliative care consulted, patient is partial code, no intubation.  If patient continues to decline he may be eligible for hospice at home.     COVID-19 Labs  No results for input(s): DDIMER, FERRITIN, LDH, CRP in the last 72 hours.  Lab Results  Component Value Date   SARSCOV2NAA NEGATIVE 12/06/2019   [place] NEGATIVE 10/05/2019   [place] NEGATIVE 02/02/2019   [place] NEGATIVE 01/18/2019     Scheduled medications:   . amLODipine  10 mg Oral Daily  . apixaban  2.5 mg Oral BID  . vitamin C  1,000 mg Oral Daily  . atorvastatin  40 mg Oral QPM  . carvedilol  6.25 mg Oral BID WC  . Chlorhexidine Gluconate Cloth  6 each Topical Daily  . cholecalciferol  5,000 Units Oral QPM  . clopidogrel  75 mg Oral Q breakfast  . dorzolamide-timolol  1 drop Both Eyes BID  . famotidine  20 mg Oral Daily  . ferrous sulfate  325 mg Oral BID WC  . finasteride  5 mg Oral QPM  . insulin aspart  0-5 Units Subcutaneous QHS  . insulin aspart  0-9 Units Subcutaneous TID WC  . latanoprost  1 drop Both Eyes QHS  . levothyroxine  75 mcg Oral QAC breakfast  . pyridOXINE  100 mg Oral Daily  . sodium chloride flush  3 mL Intravenous Q12H  . sodium chloride flush  3 mL Intravenous Q12H  . vitamin E  400 Units Oral Daily         CBG: Recent Labs  Lab 12/10/19 2131 12/11/19 0722 12/11/19 1112 12/11/19 1612 12/11/19 2028  GLUCAP  202* 167* 231* 255* 263*    SpO2: 99 % O2 Flow Rate (L/min): 3 L/min FiO2 (%): (!) 3 %    CBC: Recent Labs  Lab 12/06/19 1144 12/07/19 0515 12/08/19 0800  WBC 22.6* 15.9* 11.7*  NEUTROABS 17.8*  --   --   HGB 12.7* 10.9* 11.7*  HCT 37.1* 32.4* 35.3*  MCV 95.4 95.9 97.2  PLT 220 191 676    Basic Metabolic Panel: Recent Labs  Lab 12/08/19 0800 12/09/19 0649 12/10/19 0823 12/11/19 0858 12/12/19 0935  NA 133* 135 134* 132* 131*  K 4.1 4.0 3.7 3.6 3.4*  CL 103 102 100 98 98  CO2 _0 GLUCOSE  196* 182* 194* 179* 242*  BUN 48* 49* 50* 50* 53*  CREATININE 2.35* 2.57* 2.68* 2.53* 2.31*  CALCIUM 8.6* 8.8* 8.5* 8.8* 8.6*     Liver Function Tests: Recent Labs  Lab 12/06/19 1144  AST 26  ALT 23  ALKPHOS 48  BILITOT 1.0  PROT 7.6  ALBUMIN 3.6     Antibiotics: Anti-infectives (From admission, onward)   Start     Dose/Rate Route Frequency Ordered Stop   12/08/19 1400  vancomycin (VANCOREADY) IVPB 1500 mg/300 mL        1,500 mg 150 mL/hr over 120 Minutes Intravenous Every 48 hours 12/06/19 1339     12/07/19 1400  ceFEPIme (MAXIPIME) 1 g in sodium chloride 0.9 % 100 mL IVPB        1 g 200 mL/hr over 30 Minutes Intravenous Every 24 hours 12/06/19 1315     12/06/19 1315  ceFEPIme (MAXIPIME) 2 g in sodium chloride 0.9 % 100 mL IVPB        2 g 200 mL/hr over 30 Minutes Intravenous  Once 12/06/19 1305 12/06/19 1402   12/06/19 1315  vancomycin (VANCOCIN) IVPB 1000 mg/200 mL premix  Status:  Discontinued        1,000 mg 200 mL/hr over 60 Minutes Intravenous  Once 12/06/19 1305 12/06/19 1309   12/06/19 1315  vancomycin (VANCOREADY) IVPB 2000 mg/400 mL        2,000 mg 200 mL/hr over 120 Minutes Intravenous  Once 12/06/19 1309 12/06/19 1619       DVT prophylaxis: Apixaban  Code Status: Partial code  Family Communication: Discussed with patient's son at bedside   Consultants:    Procedures:      Objective   Vitals:   12/11/19 1655 12/11/19 2039 12/12/19 0517 12/12/19 0833  BP: (!) 151/65 (!) 178/66 (!) 178/70 (!) 178/68  Pulse: (!) 54 (!) 55 (!) 55 (!) 55  Resp:  16 18   Temp:  (!) 97.1 F (36.2 C) (!) 97.5 F (36.4 C)   TempSrc:  Axillary    SpO2:  96% 98% 99%  Weight:      Height:        Intake/Output Summary (Last 24 hours) at 12/12/2019 1156 Last data filed at 12/12/2019 0900 Gross per 24 hour  Intake 480 ml  Output 3400 ml  Net -2920 ml    11/26 1901 - 11/28 0700 In: 720 [P.O.:360] Out: 4350 [Urine:4350]  Filed Weights   12/06/19 1451  12/08/19 0500 12/11/19 0500  Weight: 103.3 kg 99.7 kg 95 kg    Physical Examination:   General-appears in no acute distress  Heart-S1-S2, regular, no murmur auscultated  Lungs-clear to auscultation bilaterally, no wheezing or crackles auscultated  Abdomen-soft, nontender, no organomegaly  Extremities-no edema in the lower extremities  Neuro-alert,  oriented x3, no focal deficit noted   Status is: Inpatient  Dispo: The patient is from: Home              Anticipated d/c is to: To be decided              Anticipated d/c date is: 12/13/2019              Patient currently not medically stable for discharge  Barrier to discharge-ongoing treatment for pulmonary edema  Pressure Injury 02/03/19 Buttocks Right Stage 1 -  Intact skin with non-blanchable redness of a localized area usually over a bony prominence. (Active)  02/03/19 0020  Location: Buttocks  Location Orientation: Right  Staging: Stage 1 -  Intact skin with non-blanchable redness of a localized area usually over a bony prominence.  Wound Description (Comments):   Present on Admission: Yes     Pressure Injury 02/03/19 Buttocks Left Stage 2 -  Partial thickness loss of dermis presenting as a shallow open injury with a red, pink wound bed without slough. (Active)  02/03/19 1707  Location: Buttocks  Location Orientation: Left  Staging: Stage 2 -  Partial thickness loss of dermis presenting as a shallow open injury with a red, pink wound bed without slough.  Wound Description (Comments):   Present on Admission: Yes           Data Reviewed:   Recent Results (from the past 240 hour(s))  Blood culture (routine x 2)     Status: None   Collection Time: 12/06/19 11:45 AM   Specimen: BLOOD  Result Value Ref Range Status   Specimen Description BLOOD RIGHT ANTECUBITAL  Final   Special Requests   Final    BOTTLES DRAWN AEROBIC AND ANAEROBIC Blood Culture adequate volume   Culture   Final    NO GROWTH 5  DAYS Performed at [facility], [address] [place], [place] [address]    Report Status 12/11/2019 FINAL  Final  Blood culture (routine x 2)     Status: None   Collection Time: 12/06/19 11:50 AM   Specimen: BLOOD  Result Value Ref Range Status   Specimen Description BLOOD RIGHT ARM  Final   Special Requests   Final    BOTTLES DRAWN AEROBIC AND ANAEROBIC Blood Culture adequate volume   Culture   Final    NO GROWTH 5 DAYS Performed at [facility], [address] [place], [place] [address]    Report Status 12/11/2019 FINAL  Final  Urine culture     Status: Abnormal   Collection Time: 12/06/19  2:06 PM   Specimen: Urine, Catheterized  Result Value Ref Range Status   Specimen Description   Final    URINE, CATHETERIZED Performed at [facility], [address], [place], [place] [address]    Special Requests   Final    NONE Performed at [facility], [address] [place], [place] [address]    Culture (A)  Final    >=100,000 COLONIES/mL PSEUDOMONAS AERUGINOSA >=100,000 COLONIES/mL ENTEROCOCCUS FAECALIS    Report Status 12/10/2019 FINAL  Final   Organism ID, Bacteria PSEUDOMONAS AERUGINOSA (A)  Final   Organism ID, Bacteria ENTEROCOCCUS FAECALIS (A)  Final      Susceptibility   Enterococcus faecalis - MIC*    AMPICILLIN <=2 SENSITIVE Sensitive     NITROFURANTOIN <=16 SENSITIVE Sensitive     VANCOMYCIN 1 SENSITIVE Sensitive     * >=100,000 COLONIES/mL ENTEROCOCCUS FAECALIS   Pseudomonas aeruginosa - MIC*  CEFTAZIDIME 4 SENSITIVE Sensitive     CIPROFLOXACIN <=0.25 SENSITIVE Sensitive     GENTAMICIN <=1 SENSITIVE Sensitive     IMIPENEM 1 SENSITIVE Sensitive     PIP/TAZO 8 SENSITIVE Sensitive     CEFEPIME 2 SENSITIVE Sensitive     * >=100,000 COLONIES/mL PSEUDOMONAS AERUGINOSA  Resp Panel by RT-PCR (Flu A&B, Covid) Nasopharyngeal Swab     Status: None   Collection Time: 12/06/19  2:07 PM   Specimen: Nasopharyngeal Swab; Nasopharyngeal(NP) swabs in vial transport medium   Result Value Ref Range Status   SARS Coronavirus 2 by RT PCR NEGATIVE NEGATIVE Final    Comment: (NOTE) SARS-CoV-2 target nucleic acids are NOT DETECTED.  The SARS-CoV-2 RNA is generally detectable in upper respiratory specimens during the acute phase of infection. The lowest concentration of SARS-CoV-2 viral copies this assay can detect is 138 copies/mL. A negative result does not preclude SARS-Cov-2 infection and should not be used as the sole basis for treatment or other patient management decisions. A negative result may occur with  improper specimen collection/handling, submission of specimen other than nasopharyngeal swab, presence of viral mutation(s) within the areas targeted by this assay, and inadequate number of viral copies(<138 copies/mL). A negative result must be combined with clinical observations, patient history, and epidemiological information. The expected result is Negative.  Fact Sheet for Patients:  EntrepreneurPulse.com.au  Fact Sheet for Healthcare Providers:  IncredibleEmployment.be  This test is no t yet approved or cleared by the [place] FDA and  has been authorized for detection and/or diagnosis of SARS-CoV-2 by FDA under an Emergency Use Authorization (EUA). This EUA will remain  in effect (meaning this test can be used) for the duration of the COVID-19 declaration under Section 564(b)(1) of the Act, 21 U.S.C.section 360bbb-3(b)(1), unless the authorization is terminated  or revoked sooner.       Influenza A by PCR NEGATIVE NEGATIVE Final   Influenza B by PCR NEGATIVE NEGATIVE Final    Comment: (NOTE) The Xpert Xpress SARS-CoV-2/FLU/RSV plus assay is intended as an aid in the diagnosis of influenza from Nasopharyngeal swab specimens and should not be used as a sole basis for treatment. Nasal washings and aspirates are unacceptable for Xpert Xpress SARS-CoV-2/FLU/RSV testing.  Fact Sheet for  Patients: EntrepreneurPulse.com.au  Fact Sheet for Healthcare Providers: IncredibleEmployment.be  This test is not yet approved or cleared by the [place] FDA and has been authorized for detection and/or diagnosis of SARS-CoV-2 by FDA under an Emergency Use Authorization (EUA). This EUA will remain in effect (meaning this test can be used) for the duration of the COVID-19 declaration under Section 564(b)(1) of the Act, 21 U.S.C. section 360bbb-3(b)(1), unless the authorization is terminated or revoked.  Performed at [facility], [address], [place], [place] [address]   MRSA PCR Screening     Status: Abnormal   Collection Time: 12/07/19  8:31 AM   Specimen: Nasal Mucosa; Nasopharyngeal  Result Value Ref Range Status   MRSA by PCR POSITIVE (A) NEGATIVE Final    Comment:        The GeneXpert MRSA Assay (FDA approved for NASAL specimens only), is one component of a comprehensive MRSA colonization surveillance program. It is not intended to diagnose MRSA infection nor to guide or monitor treatment for MRSA infections. RESULT CALLED TO, READ BACK BY AND VERIFIED WITH: DEAN,T AT 1140 ON 12/07/19 BY HUFFINES,S. Performed at [facility], [address] [place], [place] [address]      Studies:  No  results found.     Oswald Hillock   [facility] If 7PM-7AM, please contact night-coverage at [email or web address], Office  [phone]   12/12/2019, 11:56 AM  LOS: 6 days

## 2019-12-12 NOTE — Progress Notes (Signed)
Pharmacy Antibiotic Note  [name] is a 86 y.o. male admitted on 12/06/2019 with unknown source of infection.  Pharmacy has been consulted for Vancomycin and Cefepime dosing.  Plan: Vancomycin 2000 mg IV x 1 dose. Vancomycin 1500 mg IV every 48 hours. Expected AUC 511. Cefepime 1000 mg IV every 24 hours. Monitor labs, c/s, and vanco level as indicated.  Height: 5\' 8"  (172.7 cm) Weight: 95 kg (209 lb 7 oz) IBW/kg (Calculated) : 68.4  Temp (24hrs), Avg:97.7 F (36.5 C), Min:97.1 F (36.2 C), Max:98.5 F (36.9 C)  Recent Labs  Lab 12/06/19 1144 12/06/19 1144 12/06/19 1342 12/07/19 0515 12/07/19 0515 12/08/19 0800 12/09/19 0649 12/10/19 0823 12/11/19 0858 12/12/19 0935  WBC 22.6*  --   --  15.9*  --  11.7*  --   --   --   --   CREATININE 2.53*   < >  --  2.41*   < > 2.35* 2.57* 2.68* 2.53* 2.31*  LATICACIDVEN 1.8  --  1.7  --   --   --   --   --   --   --    < > = values in this interval not displayed.    Estimated Creatinine Clearance: 23.7 mL/min (A) (by C-G formula based on SCr of 2.31 mg/dL (H)).    Allergies  Allergen Reactions  . Iodinated Diagnostic Agents Other (See Comments)    caused Fever  . Dye Fdc Red [Red Dye] Hives    Tolerated red Docusate, Niferex    Antimicrobials this admission: Vanco 11/22 >>  Cefepime 11/22 >>    Microbiology results: 11/22 BCx: NGx5days 11/22 UCx: >100,000 pseudomonas aeruginosa & enterococcus faecalis Sensitive to cefepime and vancomycin respectively   Thank you for allowing pharmacy to be a part of this patient's care.  Donna Christen Joya Willmott 12/12/2019 2:20 PM

## 2019-12-13 DIAGNOSIS — N401 Enlarged prostate with lower urinary tract symptoms: Secondary | ICD-10-CM | POA: Diagnosis not present

## 2019-12-13 DIAGNOSIS — I482 Chronic atrial fibrillation, unspecified: Secondary | ICD-10-CM | POA: Diagnosis not present

## 2019-12-13 DIAGNOSIS — N390 Urinary tract infection, site not specified: Secondary | ICD-10-CM

## 2019-12-13 DIAGNOSIS — I5032 Chronic diastolic (congestive) heart failure: Secondary | ICD-10-CM | POA: Diagnosis not present

## 2019-12-13 DIAGNOSIS — Z7189 Other specified counseling: Secondary | ICD-10-CM | POA: Diagnosis not present

## 2019-12-13 DIAGNOSIS — A419 Sepsis, unspecified organism: Secondary | ICD-10-CM | POA: Diagnosis not present

## 2019-12-13 DIAGNOSIS — G9341 Metabolic encephalopathy: Secondary | ICD-10-CM | POA: Diagnosis not present

## 2019-12-13 LAB — GLUCOSE, CAPILLARY
Glucose-Capillary: 198 mg/dL — ABNORMAL HIGH (ref 70–99)
Glucose-Capillary: 205 mg/dL — ABNORMAL HIGH (ref 70–99)
Glucose-Capillary: 187 mg/dL — ABNORMAL HIGH (ref 70–99)
Glucose-Capillary: 277 mg/dL — ABNORMAL HIGH (ref 70–99)
Glucose-Capillary: 169 mg/dL — ABNORMAL HIGH (ref 70–99)
Glucose-Capillary: 230 mg/dL — ABNORMAL HIGH (ref 70–99)

## 2019-12-13 LAB — BASIC METABOLIC PANEL
Anion gap: 10 (ref 5–15)
BUN: 49 mg/dL — ABNORMAL HIGH (ref 8–23)
CO2: 25 mmol/L (ref 22–32)
Calcium: 8.9 mg/dL (ref 8.9–10.3)
Chloride: 100 mmol/L (ref 98–111)
Creatinine, Ser: 2.26 mg/dL — ABNORMAL HIGH (ref 0.61–1.24)
GFR, Estimated: 27 mL/min — ABNORMAL LOW (ref >60)
Glucose, Bld: 190 mg/dL — ABNORMAL HIGH (ref 70–99)
Potassium: 3.7 mmol/L (ref 3.5–5.1)
Sodium: 135 mmol/L (ref 135–145)

## 2019-12-13 LAB — CBC
HCT: 35.8 % — ABNORMAL LOW (ref 39.0–52.0)
Hemoglobin: 11.9 g/dL — ABNORMAL LOW (ref 13.0–17.0)
MCH: 32.1 pg (ref 26.0–34.0)
MCHC: 33.2 g/dL (ref 30.0–36.0)
MCV: 96.5 fL (ref 80.0–100.0)
Platelets: 257 10*3/uL (ref 150–400)
RBC: 3.71 MIL/uL — ABNORMAL LOW (ref 4.22–5.81)
RDW: 14.4 % (ref 11.5–15.5)
WBC: 12.1 10*3/uL — ABNORMAL HIGH (ref 4.0–10.5)
nRBC: 0 % (ref 0.0–0.2)

## 2019-12-13 MED ORDER — FUROSEMIDE 20 MG PO TABS: 20.0000 mg | ORAL_TABLET | Freq: Every day | ORAL | 11 refills | Status: DC

## 2019-12-13 MED ORDER — CARVEDILOL 6.25 MG PO TABS
6.2500 mg | ORAL_TABLET | Freq: Two times a day (BID) | ORAL | 2 refills | Status: DC
Start: 2019-12-13 — End: 2020-06-05

## 2019-12-13 NOTE — Progress Notes (Signed)
Discharge instructions reviewed with patient's son, [name], at this time. [name] given opportunity to voice concerns/ask questions. IV removed per policy and band-aid applied to site. Patient discharged at this time and left hospital via wheelchair with son.

## 2019-12-13 NOTE — Discharge Summary (Signed)
Physician Discharge Summary  [name] PFX:[id number] DOB: [birth year] DOA: 12/06/2019  PCP: Redmond School, MD  Admit date: 12/06/2019 Discharge date: 12/13/2019  Time spent: 50 minutes  Recommendations for Outpatient Follow-up:  1. Follow-up PCP in 1 week   Discharge Diagnoses:  Principal Problem:   Sepsis due to PNA Active Problems:   Moderate aortic stenosis   Hypothyroidism   Chronic diastolic CHF (congestive heart failure) (HCC)   PAF (paroxysmal atrial fibrillation) (HCC)   BPH (benign prostatic hyperplasia)   Atrial fibrillation, chronic   Type 2 diabetes mellitus with vascular disease (HCC)   Chronic kidney disease, stage 3a   Essential hypertension   S/P bilateral BKA (below knee amputation) (HCC)   Chronic indwelling Foley catheter   Aortic valve stenosis   PNA (pneumonia)   Acute metabolic encephalopathy   Discharge Condition: Stable  Diet recommendation: Heart healthy diet  Filed Weights   12/08/19 0500 12/11/19 0500 12/13/19 0500  Weight: 99.7 kg 95 kg 94.1 kg    History of present illness:  86 year old male with a history of diabetes mellitus type 2, hypertension, hyperlipidemia, PAD s/p bilateral BKA, aortic stenosis, PAF, diastolic CHF, CKD stage IV, OSA not on CPAP, bradycardia, hypothyroidism, CAD NSTEMI in October 2021 was admitted on 11-21 with sepsis with pneumonia.   Hospital Course:  1. Sepsis due to pneumonia-patient met sepsis criteria evaluation with leukocytosis, fever, tachypnea.  He was treated with vancomycin and cefepime for 8 days.  Sepsis physiology has resolved. 2. UTI-urine culture grew Pseudomonas, Enterococcus faecalis.    Treated with antibiotics as above.Got 8 days of antibiotics in the hospital. 3. Acute pulmonary edema-resolved, patient had recent NSTEMI in October 21.  Also echocardiogram showed chronic diastolic dysfunction with a EF 55 to 60%.  Troponin was elevated at  126.  Patient was not found to be candidate for  intervention due to CKD stage IV.  Continue Lipitor, Coreg and Plavix.  He diuresed well with IV Lasix 40 mg x 1.  Patient was started on furosemide 40 mg p.o. daily and has diuresed well.  He is net -7.8 L. Patient is no longer requiring oxygen.  We will discharge him on Lasix 20 mg p.o. daily. 4. CKD stage IV-creatinine is 2.26.  Little improved from yesterday.  Patient is on p.o. Lasix.  Follow BMP in am.  5. Hypothyroidism-continue Synthroid 75 mcg daily. 6. Diabetes mellitus type 2-hemoglobin A1c is 8.5 reflecting uncontrolled diabetes mellitus with hyperglycemia.  Glipizide on hold.  Continue sliding scale insulin NovoLog.  CBG fairly well controlled. 7. Chronic atrial fibrillation-continue apixaban, Coreg for rate control. 8. Hypertension-blood pressure is better controlled after adjusting medications. Continue amlodipine 10 mg daily, Coreg 6.25 mg p.o. twice daily. 9. Goals of care-palliative care consulted, patient is partial code, no intubation.  Patient has significantly improved with diuresis.  Is no longer requiring oxygen.  He is back to baseline.  Will discharge home with home health RN.  Patient's family has 24-hour care at home.  Procedures:    Consultations:  Palliative care  Discharge Exam: Vitals:   12/12/19 2149 12/13/19 0529  BP: (!) 162/75 (!) 161/57  Pulse: (!) 56 (!) 58  Resp: 20 20  Temp: 98.8 F (37.1 C) 98.2 F (36.8 C)  SpO2: 96% 94%    General: Appears in no acute distress Cardiovascular: S1-S2, regular, no murmur auscultated Respiratory: Clear to auscultation bilaterally  Discharge Instructions   Discharge Instructions    Diet - low sodium heart healthy  Complete by: As directed    Increase activity slowly   Complete by: As directed      Allergies as of 12/13/2019      Reactions   Iodinated Diagnostic Agents Other (See Comments)   caused Fever   Dye Fdc Red [red Dye] Hives   Tolerated red Docusate, Niferex      Medication List     STOP taking these medications   ciprofloxacin 250 MG tablet Commonly known as: CIPRO   Potassium Gluconate 550 (90 K) MG Tabs     TAKE these medications   acetaminophen 500 MG tablet Commonly known as: TYLENOL Take 500 mg by mouth every 6 (six) hours as needed for headache (pain).   amLODipine 10 MG tablet Commonly known as: NORVASC Take 1 tablet (10 mg total) by mouth daily.   atorvastatin 40 MG tablet Commonly known as: LIPITOR Take 1 tablet (40 mg total) by mouth every evening.   betamethasone dipropionate 0.05 % cream Apply 1 application topically 2 (two) times daily.   carvedilol 6.25 MG tablet Commonly known as: COREG Take 1 tablet (6.25 mg total) by mouth 2 (two) times daily with a meal. What changed:   medication strength  how much to take  when to take this   clopidogrel 75 MG tablet Commonly known as: PLAVIX Take 1 tablet (75 mg total) by mouth daily with breakfast.   dorzolamide-timolol 22.3-6.8 MG/ML ophthalmic solution Commonly known as: COSOPT Place 1 drop into both eyes 2 (two) times daily.   Eliquis 5 MG Tabs tablet Generic drug: apixaban Take 2.5 mg by mouth 2 (two) times daily.   famotidine 20 MG tablet Commonly known as: PEPCID Take 1 tablet (20 mg total) by mouth daily.   ferrous sulfate 325 (65 FE) MG tablet Take 325 mg by mouth 2 (two) times daily with a meal.   finasteride 5 MG tablet Commonly known as: PROSCAR Take 1 tablet (5 mg total) by mouth every evening.   furosemide 20 MG tablet Commonly known as: Lasix Take 1 tablet (20 mg total) by mouth daily. What changed:   medication strength  how much to take  when to take this  additional instructions   glipiZIDE 5 MG tablet Commonly known as: GLUCOTROL Take 5 mg by mouth 2 (two) times daily.   GLUCOSAMINE CHOND MSM FORMULA PO Take 1 tablet by mouth 2 (two) times daily.   hydrOXYzine 25 MG tablet Commonly known as: ATARAX/VISTARIL Take 25 mg by mouth 4 (four) times  daily.   latanoprost 0.005 % ophthalmic solution Commonly known as: XALATAN Place 1 drop into both eyes at bedtime.   levothyroxine 75 MCG tablet Commonly known as: SYNTHROID Take 75 mcg by mouth daily before breakfast.   OneTouch Ultra test strip Generic drug: glucose blood 1 each 2 (two) times daily.   polyethylene glycol 17 g packet Commonly known as: MiraLax Take 17 g by mouth daily as needed for moderate constipation.   pyridoxine 100 MG tablet Commonly known as: B-6 Take 100 mg by mouth daily.   tamsulosin 0.4 MG Caps capsule Commonly known as: FLOMAX Take 0.4 mg by mouth daily.   traMADol 50 MG tablet Commonly known as: Ultram Take 1 tablet (50 mg total) by mouth every 6 (six) hours as needed. What changed: reasons to take this   vitamin C 1000 MG tablet Take 1,000 mg by mouth daily.   Vitamin D-3 125 MCG (5000 UT) Tabs Take 5,000 Units by mouth every evening.  vitamin E 180 MG (400 UNITS) capsule Take 400 Units by mouth daily.      Allergies  Allergen Reactions  . Iodinated Diagnostic Agents Other (See Comments)    caused Fever  . Dye Fdc Red [Red Dye] Hives    Tolerated red Docusate, Niferex    Follow-up Information    [facility].   Specialty: Home Health Services Why: Will contact you to schedule home health visits.       [facility].   Contact information: [place] [address] [phone]       Redmond School, MD. Daphane Shepherd on 12/17/2019.   Specialty: Internal Medicine Why: 11:15 appt.  Contact information: [address] [place] [address] [phone]        Pixie Casino, MD .   Specialty: Cardiology Contact information: [place] [address] [phone]                The results of significant diagnostics from this hospitalization (including imaging, microbiology, ancillary and laboratory) are  listed below for reference.    Significant Diagnostic Studies: CT Head Wo Contrast  Result Date: 12/06/2019 CLINICAL DATA:  Mental status change. EXAM: CT HEAD WITHOUT CONTRAST TECHNIQUE: Contiguous axial images were obtained from the base of the skull through the vertex without intravenous contrast. COMPARISON:  MRI January 1921. FINDINGS: Brain: No evidence of acute large vascular territory infarction, hemorrhage, hydrocephalus, extra-axial collection or mass lesion/mass effect. Patchy white matter hypoattenuation, likely related to chronic microvascular ischemic disease. Mild generalized cerebral atrophy with ex vacuo ventricular dilation. Vascular: Calcific atherosclerosis. Skull: No acute fracture. Sinuses/Orbits: Left maxillary sinus retention cyst versus polyp. Otherwise, sinuses are clear. Unremarkable orbits. Other: Motion limited. IMPRESSION: No evidence of acute intracranial abnormality on this motion limited exam. Electronically Signed   By: Margaretha Sheffield MD   On: 12/06/2019 13:05   DG CHEST PORT 1 VIEW  Result Date: 12/08/2019 CLINICAL DATA:  Severe sepsis.  Pneumonia. EXAM: PORTABLE CHEST 1 VIEW COMPARISON:  One-view chest x-ray 12/06/2019. FINDINGS: Heart is enlarged. Increasing interstitial edema is present. Bilateral effusions are increasing, left greater than right. Bibasilar airspace disease is now present. IMPRESSION: 1. Cardiomegaly with increasing interstitial edema and bilateral effusions compatible with congestive heart failure. 2. Bibasilar airspace disease likely reflects atelectasis. Electronically Signed   By: San Morelle M.D.   On: 12/08/2019 07:44   DG Chest Port 1 View  Result Date: 12/06/2019 CLINICAL DATA:  Chest pain, sepsis. EXAM: PORTABLE CHEST 1 VIEW COMPARISON:  December 05, 2019. FINDINGS: Stable cardiomediastinal silhouette. Increased left basilar opacity is noted concerning for pneumonia or atelectasis with associated pleural effusion. No  pneumothorax is noted. Minimal right basilar subsegmental atelectasis is noted. Bony thorax is unremarkable. IMPRESSION: Increased left basilar opacity is noted concerning for pneumonia or atelectasis with associated pleural effusion. Minimal right basilar subsegmental atelectasis is noted. Electronically Signed   By: Marijo Conception M.D.   On: 12/06/2019 12:56   DG Chest Portable 1 View  Result Date: 12/05/2019 CLINICAL DATA:  Chest pain EXAM: PORTABLE CHEST 1 VIEW COMPARISON:  10/09/2019 FINDINGS: Chronic mild interstitial prominence. Patchy density at the left lung base. Stable cardiomediastinal contours. No pneumothorax. IMPRESSION: Patchy left basilar atelectasis/consolidation with possible small pleural effusion. Electronically Signed   By: Macy Mis M.D.   On: 12/05/2019 13:05    Microbiology: Recent Results (from the past 240 hour(s))  Blood culture (routine x 2)  Status: None   Collection Time: 12/06/19 11:45 AM   Specimen: BLOOD  Result Value Ref Range Status   Specimen Description BLOOD RIGHT ANTECUBITAL  Final   Special Requests   Final    BOTTLES DRAWN AEROBIC AND ANAEROBIC Blood Culture adequate volume   Culture   Final    NO GROWTH 5 DAYS Performed at [facility], [address], [place], [place] [address]    Report Status 12/11/2019 FINAL  Final  Blood culture (routine x 2)     Status: None   Collection Time: 12/06/19 11:50 AM   Specimen: BLOOD  Result Value Ref Range Status   Specimen Description BLOOD RIGHT ARM  Final   Special Requests   Final    BOTTLES DRAWN AEROBIC AND ANAEROBIC Blood Culture adequate volume   Culture   Final    NO GROWTH 5 DAYS Performed at [facility], [address] [place], [place] [address]    Report Status 12/11/2019 FINAL  Final  Urine culture     Status: Abnormal   Collection Time: 12/06/19  2:06 PM   Specimen: Urine, Catheterized  Result Value Ref Range Status   Specimen Description   Final    URINE,  CATHETERIZED Performed at [facility], [address] [place], [place] [address]    Special Requests   Final    NONE Performed at [facility], [address] [place],  [address]    Culture (A)  Final    >=100,000 COLONIES/mL PSEUDOMONAS AERUGINOSA >=100,000 COLONIES/mL ENTEROCOCCUS FAECALIS    Report Status 12/10/2019 FINAL  Final   Organism ID, Bacteria PSEUDOMONAS AERUGINOSA (A)  Final   Organism ID, Bacteria ENTEROCOCCUS FAECALIS (A)  Final      Susceptibility   Enterococcus faecalis - MIC*    AMPICILLIN <=2 SENSITIVE Sensitive     NITROFURANTOIN <=16 SENSITIVE Sensitive     VANCOMYCIN 1 SENSITIVE Sensitive     * >=100,000 COLONIES/mL ENTEROCOCCUS FAECALIS   Pseudomonas aeruginosa - MIC*    CEFTAZIDIME 4 SENSITIVE Sensitive     CIPROFLOXACIN <=0.25 SENSITIVE Sensitive     GENTAMICIN <=1 SENSITIVE Sensitive     IMIPENEM 1 SENSITIVE Sensitive     PIP/TAZO 8 SENSITIVE Sensitive     CEFEPIME 2 SENSITIVE Sensitive     * >=100,000 COLONIES/mL PSEUDOMONAS AERUGINOSA  Resp Panel by RT-PCR (Flu A&B, Covid) Nasopharyngeal Swab     Status: None   Collection Time: 12/06/19  2:07 PM   Specimen: Nasopharyngeal Swab; Nasopharyngeal(NP) swabs in vial transport medium  Result Value Ref Range Status   SARS Coronavirus 2 by RT PCR NEGATIVE NEGATIVE Final    Comment: (NOTE) SARS-CoV-2 target nucleic acids are NOT DETECTED.  The SARS-CoV-2 RNA is generally detectable in upper respiratory specimens during the acute phase of infection. The lowest concentration of SARS-CoV-2 viral copies this assay can detect is 138 copies/mL. A negative result does not preclude SARS-Cov-2 infection and should not be used as the sole basis for treatment or other patient management decisions. A negative result may occur with  improper specimen collection/handling, submission of specimen other than nasopharyngeal swab, presence of viral mutation(s) within the areas targeted by this assay, and inadequate  number of viral copies(<138 copies/mL). A negative result must be combined with clinical observations, patient history, and epidemiological information. The expected result is Negative.  Fact Sheet for Patients:  EntrepreneurPulse.com.au  Fact Sheet for Healthcare Providers:  IncredibleEmployment.be  This test is no t yet approved or cleared by the [place]  States FDA and  has been authorized for detection and/or diagnosis of SARS-CoV-2 by FDA under an Emergency Use Authorization (EUA). This EUA will remain  in effect (meaning this test can be used) for the duration of the COVID-19 declaration under Section 564(b)(1) of the Act, 21 U.S.C.section 360bbb-3(b)(1), unless the authorization is terminated  or revoked sooner.       Influenza A by PCR NEGATIVE NEGATIVE Final   Influenza B by PCR NEGATIVE NEGATIVE Final    Comment: (NOTE) The Xpert Xpress SARS-CoV-2/FLU/RSV plus assay is intended as an aid in the diagnosis of influenza from Nasopharyngeal swab specimens and should not be used as a sole basis for treatment. Nasal washings and aspirates are unacceptable for Xpert Xpress SARS-CoV-2/FLU/RSV testing.  Fact Sheet for Patients: EntrepreneurPulse.com.au  Fact Sheet for Healthcare Providers: IncredibleEmployment.be  This test is not yet approved or cleared by the [place] FDA and has been authorized for detection and/or diagnosis of SARS-CoV-2 by FDA under an Emergency Use Authorization (EUA). This EUA will remain in effect (meaning this test can be used) for the duration of the COVID-19 declaration under Section 564(b)(1) of the Act, 21 U.S.C. section 360bbb-3(b)(1), unless the authorization is terminated or revoked.  Performed at [facility], [address], [place], [place] [address]   MRSA PCR Screening     Status: Abnormal   Collection Time: 12/07/19  8:31 AM   Specimen: Nasal Mucosa;  Nasopharyngeal  Result Value Ref Range Status   MRSA by PCR POSITIVE (A) NEGATIVE Final    Comment:        The GeneXpert MRSA Assay (FDA approved for NASAL specimens only), is one component of a comprehensive MRSA colonization surveillance program. It is not intended to diagnose MRSA infection nor to guide or monitor treatment for MRSA infections. RESULT CALLED TO, READ BACK BY AND VERIFIED WITH: DEAN,T AT 1140 ON 12/07/19 BY HUFFINES,S. Performed at [facility], [address] [place], [place] [address]      Labs: Basic Metabolic Panel: Recent Labs  Lab 12/09/19 0649 12/10/19 0823 12/11/19 0858 12/12/19 0935 12/13/19 0830  NA 135 134* 132* 131* 135  K 4.0 3.7 3.6 3.4* 3.7  CL 102 100 98 98 100  CO2 22 23 24 23 25   GLUCOSE 182* 194* 179* 242* 190*  BUN 49* 50* 50* 53* 49*  CREATININE 2.57* 2.68* 2.53* 2.31* 2.26*  CALCIUM 8.8* 8.5* 8.8* 8.6* 8.9   CBC: Recent Labs  Lab 12/07/19 0515 12/08/19 0800 12/13/19 0830  WBC 15.9* 11.7* 12.1*  HGB 10.9* 11.7* 11.9*  HCT 32.4* 35.3* 35.8*  MCV 95.9 97.2 96.5  PLT 191 191 257   CBG: Recent Labs  Lab 12/12/19 1116 12/12/19 1610 12/12/19 2144 12/13/19 0750 12/13/19 1149  GLUCAP 277* 231* 217* 169* 230*       Signed:  Oswald Hillock MD.  [facility] 12/13/2019, 1:45 PM

## 2019-12-13 NOTE — Progress Notes (Signed)
Daily Progress Note   Patient Name: [name]       Date: 12/13/2019 DOB: 09/24/1927  Age: 86 y.o. MRN#: [id number] Attending Physician: Oswald Hillock, MD Primary Care Physician: Redmond School, MD Admit Date: 12/06/2019  Reason for Consultation/Follow-up: Establishing goals of care  Subjective: Patient sleeping. Son, [name] is at bedside. [name] feels patient has greatly improved since admission. Hoping for discharge home soon with home health and family caretakers. Dr. Darrick Meigs in while I am at beside and confirms plan for discharge home today.  Discussed with [name] concerns re: chronic illness with multiple recent insults and risk for overall decline towards end of life in the next several months. [name] agrees to referral to outpatient Palliative services at home.   Review of Systems  Unable to perform ROS: Mental acuity    Length of Stay: 7  Current Medications: Scheduled Meds:   amLODipine  10 mg Oral Daily   apixaban  2.5 mg Oral BID   vitamin C  1,000 mg Oral Daily   atorvastatin  40 mg Oral QPM   carvedilol  6.25 mg Oral BID WC   Chlorhexidine Gluconate Cloth  6 each Topical Daily   cholecalciferol  5,000 Units Oral QPM   clopidogrel  75 mg Oral Q breakfast   dorzolamide-timolol  1 drop Both Eyes BID   famotidine  20 mg Oral Daily   ferrous sulfate  325 mg Oral BID WC   finasteride  5 mg Oral QPM   insulin aspart  0-5 Units Subcutaneous QHS   insulin aspart  0-9 Units Subcutaneous TID WC   latanoprost  1 drop Both Eyes QHS   levothyroxine  75 mcg Oral QAC breakfast   pyridOXINE  100 mg Oral Daily   sodium chloride flush  3 mL Intravenous Q12H   sodium chloride flush  3 mL Intravenous Q12H   vitamin E  400 Units Oral Daily    Continuous  Infusions:  sodium chloride      PRN Meds: sodium chloride, acetaminophen **OR** acetaminophen, bisacodyl, morphine injection, ondansetron **OR** ondansetron (ZOFRAN) IV, polyethylene glycol, sodium chloride flush, traZODone  Physical Exam Vitals and nursing note reviewed.  Constitutional:      Appearance: Normal appearance. He is not ill-appearing.  Pulmonary:     Effort: Pulmonary effort is normal.  Vital Signs: BP (!) 161/57 (BP Location: Left Arm)    Pulse (!) 58    Temp 98.2 F (36.8 C)    Resp 20    Ht 5\' 8"  (1.727 m)    Wt 94.1 kg    SpO2 94%    BMI 31.54 kg/m  SpO2: SpO2: 94 % O2 Device: O2 Device: Room Air O2 Flow Rate: O2 Flow Rate (L/min): 3 L/min  Intake/output summary:   Intake/Output Summary (Last 24 hours) at 12/13/2019 1244 Last data filed at 12/13/2019 1013 Gross per 24 hour  Intake 4560 ml  Output 2900 ml  Net 1660 ml   LBM: Last BM Date: 12/10/19 Baseline Weight: Weight: 98.9 kg Most recent weight: Weight: 94.1 kg       Palliative Assessment/Data: PPS: 30%   Flowsheet Rows     Most Recent Value  Intake Tab  Referral Department Hospitalist  Unit at Time of Referral Cardiac/Telemetry Unit  Palliative Care Primary Diagnosis Sepsis/Infectious Disease  Date Notified 12/06/19  Palliative Care Type New Palliative care  Reason for referral Clarify Goals of Care  Date of Admission 12/06/19  Date first seen by Palliative Care 12/07/19  # of days Palliative referral response time 1 Day(s)  # of days IP prior to Palliative referral 0  Clinical Assessment  Palliative Performance Scale Score 40%  Pain Max last 24 hours Not able to report  Pain Min Last 24 hours Not able to report  Dyspnea Max Last 24 Hours Not able to report  Dyspnea Min Last 24 hours Not able to report  Psychosocial & Spiritual Assessment  Palliative Care Outcomes      Patient Active Problem List   Diagnosis Date Noted   PNA (pneumonia) 12/06/2019   Sepsis due to  PNA 25/36/6440   Acute metabolic encephalopathy 34/74/2595   AKI (acute kidney injury) ([place])    Aortic valve stenosis    Palliative care encounter    NSTEMI (non-ST elevated myocardial infarction) ([place]) 10/04/2019   Acute kidney injury superimposed on CKD ([place]) 10/04/2019   Hypokalemia 10/04/2019   Weakness generalized 04/05/2019   S/P BKA (below knee amputation) unilateral, left ([place]) 02/15/2019   Pressure injury of skin 02/03/2019   TIA (transient ischemic attack) 02/03/2019   Transient speech disturbance 02/02/2019   Chronic indwelling Foley catheter 01/22/2019   Postoperative wound infection 01/20/2019   Normocytic anemia 01/18/2019   Cellulitis 01/18/2019   Infection of deep incisional surgical site after procedure    Abnormal urinalysis    Labile blood glucose    Urinary retention    S/P bilateral BKA (below knee amputation) (HCC)    Acute blood loss anemia    Urinary retention due to benign prostatic hyperplasia 12/28/2018   Unilateral complete BKA, left, initial encounter ([place]) 12/25/2018   Dehiscence of amputation stump ([place])    History of transmetatarsal amputation of left foot ([place]) 12/09/2018   Critical limb ischemia with history of revascularization of same extremity ([place])    Gangrene of left foot ([place])    Diabetic ulcer of toe of left foot associated with type 2 diabetes mellitus ([place])    Cellulitis of left lower extremity    Toe infection 11/18/2018   Peripheral edema    Acute on chronic diastolic CHF (congestive heart failure) ([place]) 11/09/2018   S/P BKA (below knee amputation) unilateral, right ([place]) 10/05/2018   Hyponatremia    Essential hypertension    Postoperative pain    Pain of left heel    Unable  to maintain weight-bearing    Type 2 diabetes mellitus with diabetic peripheral angiopathy with gangrene ([place])    Anemia due to acute blood loss    Chronic kidney disease, stage 3a    Acquired absence of right leg  below knee ([place]) 09/07/2018   Gangrene of right foot (HCC)    DNR (do not resuscitate) discussion    Goals of care, counseling/discussion    Palliative care by specialist    Type 2 diabetes mellitus with vascular disease ([place])    Severe protein-calorie malnutrition ([place])    Ischemic foot 08/20/2018   Critical lower limb ischemia ([place]) 08/03/2018   Type 2 diabetes mellitus with foot ulcer, without long-term current use of insulin (HCC)    Gastroesophageal reflux disease    Cellulitis of great toe of right foot 05/29/2018   Atrial fibrillation, chronic    Chest pain 07/25/2017   PAF (paroxysmal atrial fibrillation) ([place]) 07/25/2017   BPH (benign prostatic hyperplasia) 07/25/2017   Shortness of breath 05/11/2017   Hypothyroidism 05/11/2017   Chronic diastolic CHF (congestive heart failure) ([place]) 05/11/2017   Moderate aortic stenosis 06/12/2016   RBBB 06/12/2016   Peripheral vascular disease ([place]) 08/04/2012   Essential hypertension, benign 08/04/2012   Hyperlipidemia 08/04/2012   Dizziness 08/04/2012    Palliative Care Assessment & Plan   Patient Profile: 86 y.o. male  with past medical history of CHF, CKD, DM, HTN, BL BKA, STEMI Oct 2021 admitted on 12/06/2019 with sepsis dt PNE.   Assessment/Recommendations/Plan  Pt to discharge home today- plan for outpatient Palliative referral  Goals of Care and Additional Recommendations: Limitations on Scope of Treatment: Full Scope Treatment  Code Status: Limited code  Prognosis:  Unable to determine  Discharge Planning: Home with [place] was discussed with patient' son- [name].   Thank you for allowing the Palliative Medicine Team to assist in the care of this patient.   Total time: 38 mins Greater than 50%  of this time was spent counseling and coordinating care related to the above assessment and plan.  Mariana Kaufman, AGNP-C Palliative Medicine   Please contact Palliative  Medicine Team phone at [phone] for questions and concerns.

## 2019-12-13 NOTE — Care Management Important Message (Signed)
Important Message  Patient Details  Name: [name] MRN: [id number] Date of Birth: 06-01-1927   Medicare Important Message Given:  Yes     [name] 12/13/2019, 2:15 PM

## 2019-12-13 NOTE — TOC Transition Note (Signed)
Transition of Care [facility]) - CM/SW Discharge Note   Patient Details  Name: [name] MRN: [id number] Date of Birth: May 09, 1927  Transition of Care [facility]) CM/SW Contact:  Salome Arnt, LCSW Phone Number: 12/13/2019, 1:13 PM   Clinical Narrative:  Pt d/c today. LCSW spoke with pt's son, [name] who requests [facility] with [facility]. Vaughan Basta with [facility] notified and accepts referral. TOC received consult for outpatient palliative. LCSW discussed with [name] who is agreeable to refer to [facility]. [name] is aware that [facility] will contact them hopefully by the end of the week. Hospital follow up appointment made with PCP. All above information entered on AVS. No other needs reported.      Final next level of care: [place] Barriers to Discharge: Barriers Resolved   Patient Goals and CMS Choice Patient states their goals for this hospitalization and ongoing recovery are:: return home   Choice offered to / list presented to : Adult Children  Discharge Placement                  Name of family member notified: [name] Patient and family notified of of transfer: 12/13/19  Discharge Plan and Services In-house Referral: Clinical Social Work              DME Arranged: N/A DME Agency: NA       HH Arranged: RN [place] Agency: Microbiologist ([place]) Date Blanket: 12/13/19 Time [place]: [place] Representative spoke with at [place]: [place] ([place]) Interventions     Readmission Risk Interventions Readmission Risk Prevention Plan 12/13/2019 12/08/2019 10/06/2019  Transportation Screening - Complete Complete  PCP or Specialist Appt within 3-5 Days - - -  Not Complete comments - - -  HRI or [place] or Home Care Consult comments - - -  Social Work Scientific laboratory technician for [place] Planning/Counseling - - -  [place] - - -  Medication Review Press photographer) -  Complete Complete  PCP or Specialist appointment within 3-5 days of discharge Complete - Complete  HRI or [place] - - Complete  SW Recovery Care/Counseling Consult - Complete Complete  Palliative Care Screening - Complete Not Applicable  [place] - Not Applicable Not Applicable  Some recent data might be hidden

## 2019-12-13 NOTE — Progress Notes (Signed)
Initial visit. [name], Mr Thelin' son was present and very expressive about his dad's health changes. Life review for his family--Mr Scherzer has supportive family who are attentive to his care at home. [name] is known to [employer]. Prayers for healing and peace.

## 2019-12-13 NOTE — Progress Notes (Signed)
Inpatient Diabetes Program Recommendations  AACE/ADA: New Consensus Statement on Inpatient Glycemic Control   Target Ranges:  Prepandial:   less than 140 mg/dL      Peak postprandial:   less than 180 mg/dL (1-2 hours)      Critically ill patients:  140 - 180 mg/dL   Results for DOUG, BUCKLIN (MRN [id number]) as of 12/13/2019 08:18  Ref. Range 12/12/2019 16:10 12/12/2019 21:44 12/13/2019 07:50  Glucose-Capillary Latest Ref Range: 70 - 99 mg/dL 231 (H) 217 (H) 169 (H)   Review of Glycemic Control  Diabetes history: DM2 Outpatient Diabetes medications: Glipizide 5 mg BID Current orders for Inpatient glycemic control: Novolog 0-9 units TID with meals, Novolog 0-5 units QHS  Inpatient Diabetes Program Recommendations:    Insulin: Please consider ordering Novolog 2 units TID with meals for meal coverage if patient eats at least 50% of meals.  Thanks, Barnie Alderman, RN, MSN, CDE Diabetes Coordinator Inpatient Diabetes Program [phone] (Team Pager from 8am to 5pm)

## 2019-12-14 ENCOUNTER — Encounter: Payer: Medicare Other | Admitting: Physical Therapy

## 2019-12-14 ENCOUNTER — Telehealth: Payer: Self-pay

## 2019-12-14 DIAGNOSIS — B965 Pseudomonas (aeruginosa) (mallei) (pseudomallei) as the cause of diseases classified elsewhere: Secondary | ICD-10-CM | POA: Diagnosis not present

## 2019-12-14 DIAGNOSIS — D631 Anemia in chronic kidney disease: Secondary | ICD-10-CM | POA: Diagnosis not present

## 2019-12-14 DIAGNOSIS — I2510 Atherosclerotic heart disease of native coronary artery without angina pectoris: Secondary | ICD-10-CM | POA: Diagnosis not present

## 2019-12-14 DIAGNOSIS — I130 Hypertensive heart and chronic kidney disease with heart failure and stage 1 through stage 4 chronic kidney disease, or unspecified chronic kidney disease: Secondary | ICD-10-CM | POA: Diagnosis not present

## 2019-12-14 DIAGNOSIS — E1165 Type 2 diabetes mellitus with hyperglycemia: Secondary | ICD-10-CM | POA: Diagnosis not present

## 2019-12-14 DIAGNOSIS — G9341 Metabolic encephalopathy: Secondary | ICD-10-CM | POA: Diagnosis not present

## 2019-12-14 DIAGNOSIS — N179 Acute kidney failure, unspecified: Secondary | ICD-10-CM | POA: Diagnosis not present

## 2019-12-14 DIAGNOSIS — N390 Urinary tract infection, site not specified: Secondary | ICD-10-CM | POA: Diagnosis not present

## 2019-12-14 DIAGNOSIS — E7849 Other hyperlipidemia: Secondary | ICD-10-CM | POA: Diagnosis not present

## 2019-12-14 DIAGNOSIS — B952 Enterococcus as the cause of diseases classified elsewhere: Secondary | ICD-10-CM | POA: Diagnosis not present

## 2019-12-14 DIAGNOSIS — E1122 Type 2 diabetes mellitus with diabetic chronic kidney disease: Secondary | ICD-10-CM | POA: Diagnosis not present

## 2019-12-14 DIAGNOSIS — I129 Hypertensive chronic kidney disease with stage 1 through stage 4 chronic kidney disease, or unspecified chronic kidney disease: Secondary | ICD-10-CM | POA: Diagnosis not present

## 2019-12-14 DIAGNOSIS — N184 Chronic kidney disease, stage 4 (severe): Secondary | ICD-10-CM | POA: Diagnosis not present

## 2019-12-14 DIAGNOSIS — I4891 Unspecified atrial fibrillation: Secondary | ICD-10-CM | POA: Diagnosis not present

## 2019-12-14 DIAGNOSIS — I5032 Chronic diastolic (congestive) heart failure: Secondary | ICD-10-CM | POA: Diagnosis not present

## 2019-12-14 NOTE — Telephone Encounter (Signed)
Attempted to contact patient to schedule a Palliative care consult appointment. No answer left a voicemail to return call.  

## 2019-12-16 ENCOUNTER — Encounter: Payer: Medicare Other | Admitting: Physical Therapy

## 2019-12-16 ENCOUNTER — Telehealth: Payer: Self-pay

## 2019-12-16 DIAGNOSIS — G9341 Metabolic encephalopathy: Secondary | ICD-10-CM | POA: Diagnosis not present

## 2019-12-16 DIAGNOSIS — E1122 Type 2 diabetes mellitus with diabetic chronic kidney disease: Secondary | ICD-10-CM | POA: Diagnosis not present

## 2019-12-16 DIAGNOSIS — N179 Acute kidney failure, unspecified: Secondary | ICD-10-CM | POA: Diagnosis not present

## 2019-12-16 DIAGNOSIS — I5032 Chronic diastolic (congestive) heart failure: Secondary | ICD-10-CM | POA: Diagnosis not present

## 2019-12-16 DIAGNOSIS — D631 Anemia in chronic kidney disease: Secondary | ICD-10-CM | POA: Diagnosis not present

## 2019-12-16 DIAGNOSIS — I2510 Atherosclerotic heart disease of native coronary artery without angina pectoris: Secondary | ICD-10-CM | POA: Diagnosis not present

## 2019-12-16 DIAGNOSIS — B965 Pseudomonas (aeruginosa) (mallei) (pseudomallei) as the cause of diseases classified elsewhere: Secondary | ICD-10-CM | POA: Diagnosis not present

## 2019-12-16 DIAGNOSIS — B952 Enterococcus as the cause of diseases classified elsewhere: Secondary | ICD-10-CM | POA: Diagnosis not present

## 2019-12-16 DIAGNOSIS — N390 Urinary tract infection, site not specified: Secondary | ICD-10-CM | POA: Diagnosis not present

## 2019-12-16 DIAGNOSIS — I130 Hypertensive heart and chronic kidney disease with heart failure and stage 1 through stage 4 chronic kidney disease, or unspecified chronic kidney disease: Secondary | ICD-10-CM | POA: Diagnosis not present

## 2019-12-16 DIAGNOSIS — N184 Chronic kidney disease, stage 4 (severe): Secondary | ICD-10-CM | POA: Diagnosis not present

## 2019-12-16 DIAGNOSIS — E1165 Type 2 diabetes mellitus with hyperglycemia: Secondary | ICD-10-CM | POA: Diagnosis not present

## 2019-12-16 NOTE — Telephone Encounter (Signed)
Spoke with patient's son [name] and scheduled a in-person Palliative Consult for 12/22/19 @ 1:30PM   COVID screening was negative. No pets in home. Patient lives with alone, but his children stay with him.   Consent obtained; updated Outlook/Netsmart/Team List and Epic.

## 2019-12-17 DIAGNOSIS — Z681 Body mass index (BMI) 19 or less, adult: Secondary | ICD-10-CM | POA: Diagnosis not present

## 2019-12-17 DIAGNOSIS — N184 Chronic kidney disease, stage 4 (severe): Secondary | ICD-10-CM | POA: Diagnosis not present

## 2019-12-17 DIAGNOSIS — I700 Atherosclerosis of aorta: Secondary | ICD-10-CM | POA: Diagnosis not present

## 2019-12-17 DIAGNOSIS — I219 Acute myocardial infarction, unspecified: Secondary | ICD-10-CM | POA: Diagnosis not present

## 2019-12-17 DIAGNOSIS — E860 Dehydration: Secondary | ICD-10-CM | POA: Diagnosis not present

## 2019-12-19 DIAGNOSIS — D631 Anemia in chronic kidney disease: Secondary | ICD-10-CM | POA: Diagnosis not present

## 2019-12-19 DIAGNOSIS — G9341 Metabolic encephalopathy: Secondary | ICD-10-CM | POA: Diagnosis not present

## 2019-12-19 DIAGNOSIS — N390 Urinary tract infection, site not specified: Secondary | ICD-10-CM | POA: Diagnosis not present

## 2019-12-19 DIAGNOSIS — B965 Pseudomonas (aeruginosa) (mallei) (pseudomallei) as the cause of diseases classified elsewhere: Secondary | ICD-10-CM | POA: Diagnosis not present

## 2019-12-19 DIAGNOSIS — N184 Chronic kidney disease, stage 4 (severe): Secondary | ICD-10-CM | POA: Diagnosis not present

## 2019-12-19 DIAGNOSIS — E1165 Type 2 diabetes mellitus with hyperglycemia: Secondary | ICD-10-CM | POA: Diagnosis not present

## 2019-12-19 DIAGNOSIS — B952 Enterococcus as the cause of diseases classified elsewhere: Secondary | ICD-10-CM | POA: Diagnosis not present

## 2019-12-19 DIAGNOSIS — N179 Acute kidney failure, unspecified: Secondary | ICD-10-CM | POA: Diagnosis not present

## 2019-12-19 DIAGNOSIS — E1122 Type 2 diabetes mellitus with diabetic chronic kidney disease: Secondary | ICD-10-CM | POA: Diagnosis not present

## 2019-12-19 DIAGNOSIS — I5032 Chronic diastolic (congestive) heart failure: Secondary | ICD-10-CM | POA: Diagnosis not present

## 2019-12-19 DIAGNOSIS — I2510 Atherosclerotic heart disease of native coronary artery without angina pectoris: Secondary | ICD-10-CM | POA: Diagnosis not present

## 2019-12-19 DIAGNOSIS — I130 Hypertensive heart and chronic kidney disease with heart failure and stage 1 through stage 4 chronic kidney disease, or unspecified chronic kidney disease: Secondary | ICD-10-CM | POA: Diagnosis not present

## 2019-12-21 ENCOUNTER — Encounter: Payer: Medicare Other | Admitting: Physical Therapy

## 2019-12-22 ENCOUNTER — Other Ambulatory Visit: Payer: Self-pay

## 2019-12-22 ENCOUNTER — Other Ambulatory Visit: Payer: Medicare Other | Admitting: Nurse Practitioner

## 2019-12-22 DIAGNOSIS — Z515 Encounter for palliative care: Secondary | ICD-10-CM | POA: Diagnosis not present

## 2019-12-22 DIAGNOSIS — N184 Chronic kidney disease, stage 4 (severe): Secondary | ICD-10-CM

## 2019-12-22 NOTE — Progress Notes (Unsigned)
[facility] Consult Note Telephone: [phone]  Fax: [phone]  PATIENT NAME: [name] [address] [place] [address] [phone] (home)  DOB: [birth year] MRN: [id number]  PRIMARY CARE PROVIDER:    Redmond School, MD,  [address] [place] [address] [phone]  REFERRING PROVIDER:   Redmond School, [place],  [place] [address] [phone]  RESPONSIBLE PARTY:   Extended Emergency Contact Information Primary Emergency Contact: [name] Address: [address]          [place], [place] [address] [name] of [place] Phone: [phone] Relation: Daughter Secondary Emergency Contact: [name], [place] [address] [name] of [place] Mobile Phone: [phone] Relation: Son  I met face to face with patient in home.   ASSESSMENT AND RECOMMENDATIONS:   Advance Care Planning: Today's visit consisted of building trust and discussions on Palliative care medicine as a specialized medical care for people living with serious illness, aimed at facilitating improved quality of life through symptoms relief, assisting with advance care planning and establishing goals of care. Patient's son [name] present during visit, he expressed appreciation for education provided on palliative care and how it differs from Hospice service. Palliative care will continue to provide support to patient, family and the medical team. Goal of care: Patient's goal of care is function and comfort. Family desires for patient to stay home for as long as he can, they want him functional if possible, so he can walk with a walker and go do whatever he feels like doing. Directives: Family desires for patient to receive CPR but not intubation in the event of cardiac or respiratory arrest. Family desires attempt at resuscitation for patient. [name] made aware that due to patient's complex disease issues he is  at increased risk for skin breakdown with infection, gradual weight loss, dysphagia, aspiration, vascular compromise leading to repeat MI, possible CVA or death, made aware that these risk will remain and will progress over time due to advancing age. [name] expressed understanding of patient's comorbid conditions and how that can contribute to a poor outcome from CPR for him. He however desire attempt /chance at resuscitation. Discussed need to complete a MOST form, a blank MOST form was given to [facility] to review with patient and rest of the family, education material on MOST form also given to him, we wll review and complete at next visit. Palliative care will continue to provide support to patient, family and the medical team.   Symptom Management:  Urinary retention: Patient has chronic foley cath due to urinary retention secondary to BPH. Foley is changed monthly by Urology. Treated for UTI during last hospitalization last month. No report of fever, chills, or evidence of infection on exam today. Pain:  Family report occasional pain at stump, pain is controlled on PRN Tylenol. Patient has a stand assist device for transfers, has hospital bed and a wheelchair accessible van. Family report he sleeps well and has great appetite. Patient receives [place] [facility], receives PT twice a week, and nurse visit twice a week, OT was recently discontinued. Family report good progress with therapy. Provided general support and encouragement, no other unmet needs identified.  Follow up Palliative Care Visit: Palliative care will continue to follow for goals of care clarification and symptom management. Return in about 6-8 weeks or prn.  Family /Caregiver/Community Supports: Patient is a widow, lives at home, his 3  children take turns to care for him. [name] his eldest child stays with him during the week from Sunday to Friday. Patient has 7 siblings and they all live close to him in a 150 acre family  land. Patient receives meals on wheels. 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  Cognitive / Functional decline: Patient awake and alert, hard of hearing. Patient uses prosthesis due to bilateral BKA, he is dependent on family for his ADLs. He is continent of bowel. He is mostly wheelchair dependent, no report of falls, he has a wheelchair ramp.  I spent 80 minutes providing this consultation, time includes time spent with patient/family, chart review, provider coordination, and documentation. More than 50% of the time in this consultation was spent coordinating communication.   CHIEF COMPLAINT: Initial palliative care consult  HISTORY OF PRESENT ILLNESS:  [name] is a 86 y.o. year old male with multiple medical problems including CKD 4, chronic A-fib, moderate aortic stenosis, hypothyroidism, chronic diastolic heart failure (EF 55-60% in 08/2019), BPH, Type 2 diabetes (A1c 8.5), HTN, PAD s/p bilateral BKA. Patient has recent NSTEMI in October 2021. Last hospitalization 12/06/2019 - 12/13/2019 for sepsis due to pneumonia and UTI. Palliative Care was asked to follow this patient by consultation request of Redmond School, MD to help address advance care planning and goals of care.   CODE STATUS: CPR/no intubation  PPS: 40%  HOSPICE ELIGIBILITY/DIAGNOSIS: TBD  PHYSICAL EXAM / ROS:   Current and past weights: 227.26lbs, Ht 38f8", BMI 34.6kg/m2 on 12/06/2019 General: NAD, frail appearing, obese Cardiovascular: denied chest pain, denied palpitation  Pulmonary: no cough, no increased SOB, room air GI:  appetite good, denied constipation, continent of bowel GU: denies dysuria, chronic foley cath, urine clear and yellow MSK:  No ROM abnormality Skin: no rashes or wounds reported Neurological: Weakness, but otherwise nonfocal  PAST MEDICAL HISTORY:  Past Medical History:  Diagnosis Date  . Arthritis    . Borderline diabetic   . CHF (congestive heart failure) ([place]   . Chronic kidney disease   . Diabetes mellitus without complication ([place]   . Dysrhythmia   . Glaucoma   . Hyperlipidemia   . Hypertension   . Hypothyroidism   . Myocardial infarction ([place]   . PVD (peripheral vascular disease) ([place]   . Sleep apnea    Cpap ordered but doesnt use  . Urinary retention due to benign prostatic hyperplasia 12/28/2018    SOCIAL HX:  Social History   Tobacco Use  . Smoking status: Former Smoker    Quit date: 01/13/1965    Years since quitting: 54.9  . Smokeless tobacco: Former USystems developer   Types: Chew  Substance Use Topics  . Alcohol use: No   FAMILY HX:  Family History  Problem Relation Age of Onset  . Cancer Mother   . Heart attack Father   . Stroke Father   . Heart attack Brother   . Heart attack Brother     ALLERGIES:  Allergies  Allergen Reactions  . Iodinated Diagnostic Agents Other (See Comments)    caused Fever  . Dye Fdc Red [Red Dye] Hives    Tolerated red Docusate, Niferex     PERTINENT MEDICATIONS:  Outpatient Encounter Medications as of 12/22/2019  Medication Sig  . acetaminophen (TYLENOL) 500 MG tablet Take 500 mg by mouth every 6 (six) hours as needed for headache (pain).  .Marland KitchenamLODipine (NORVASC) 10 MG  tablet Take 1 tablet (10 mg total) by mouth daily.  [name] apixaban (ELIQUIS) 5 MG TABS tablet Take 2.5 mg by mouth 2 (two) times daily.   . Ascorbic Acid (VITAMIN C) 1000 MG tablet Take 1,000 mg by mouth daily.   [name] atorvastatin (LIPITOR) 40 MG tablet Take 1 tablet (40 mg total) by mouth every evening.  . betamethasone dipropionate 0.05 % cream Apply 1 application topically 2 (two) times daily.   . carvedilol (COREG) 6.25 MG tablet Take 1 tablet (6.25 mg total) by mouth 2 (two) times daily with a meal.  . Cholecalciferol (VITAMIN D-3) 125 MCG (5000 UT) TABS Take 5,000 Units by mouth every evening.  . clopidogrel (PLAVIX) 75 MG tablet Take 1 tablet (75 mg total) by  mouth daily with breakfast.  . dorzolamide-timolol (COSOPT) 22.3-6.8 MG/ML ophthalmic solution Place 1 drop into both eyes 2 (two) times daily.  (Patient not taking: Reported on 12/06/2019)  . famotidine (PEPCID) 20 MG tablet Take 1 tablet (20 mg total) by mouth daily.  . ferrous sulfate 325 (65 FE) MG tablet Take 325 mg by mouth 2 (two) times daily with a meal.  . finasteride (PROSCAR) 5 MG tablet Take 1 tablet (5 mg total) by mouth every evening. (Patient not taking: Reported on 12/06/2019)  . furosemide (LASIX) 20 MG tablet Take 1 tablet (20 mg total) by mouth daily.  [name] glipiZIDE (GLUCOTROL) 5 MG tablet Take 5 mg by mouth 2 (two) times daily.   . hydrOXYzine (ATARAX/VISTARIL) 25 MG tablet Take 25 mg by mouth 4 (four) times daily. (Patient not taking: Reported on 12/06/2019)  . latanoprost (XALATAN) 0.005 % ophthalmic solution Place 1 drop into both eyes at bedtime.  [name] levothyroxine (SYNTHROID) 75 MCG tablet Take 75 mcg by mouth daily before breakfast.   . Misc Natural Products (GLUCOSAMINE CHOND MSM FORMULA PO) Take 1 tablet by mouth 2 (two) times daily.  Glory Rosebush ULTRA test strip 1 each 2 (two) times daily.  . polyethylene glycol (MIRALAX) 17 g packet Take 17 g by mouth daily as needed for moderate constipation.  [name] pyridoxine (B-6) 100 MG tablet Take 100 mg by mouth daily.  . tamsulosin (FLOMAX) 0.4 MG CAPS capsule Take 0.4 mg by mouth daily. (Patient not taking: Reported on 12/06/2019)  . traMADol (ULTRAM) 50 MG tablet Take 1 tablet (50 mg total) by mouth every 6 (six) hours as needed. (Patient taking differently: Take 50 mg by mouth every 6 (six) hours as needed (pain). )  . vitamin E 400 UNIT capsule Take 400 Units by mouth daily.   No facility-administered encounter medications on file as of 12/22/2019.     Jari Favre, DNP, AGPCNP-BC

## 2019-12-23 ENCOUNTER — Encounter: Payer: Medicare Other | Admitting: Physical Therapy

## 2019-12-23 DIAGNOSIS — E1122 Type 2 diabetes mellitus with diabetic chronic kidney disease: Secondary | ICD-10-CM | POA: Diagnosis not present

## 2019-12-23 DIAGNOSIS — D631 Anemia in chronic kidney disease: Secondary | ICD-10-CM | POA: Diagnosis not present

## 2019-12-23 DIAGNOSIS — E1165 Type 2 diabetes mellitus with hyperglycemia: Secondary | ICD-10-CM | POA: Diagnosis not present

## 2019-12-23 DIAGNOSIS — N390 Urinary tract infection, site not specified: Secondary | ICD-10-CM | POA: Diagnosis not present

## 2019-12-23 DIAGNOSIS — I5032 Chronic diastolic (congestive) heart failure: Secondary | ICD-10-CM | POA: Diagnosis not present

## 2019-12-23 DIAGNOSIS — B952 Enterococcus as the cause of diseases classified elsewhere: Secondary | ICD-10-CM | POA: Diagnosis not present

## 2019-12-23 DIAGNOSIS — N184 Chronic kidney disease, stage 4 (severe): Secondary | ICD-10-CM | POA: Diagnosis not present

## 2019-12-23 DIAGNOSIS — I130 Hypertensive heart and chronic kidney disease with heart failure and stage 1 through stage 4 chronic kidney disease, or unspecified chronic kidney disease: Secondary | ICD-10-CM | POA: Diagnosis not present

## 2019-12-23 DIAGNOSIS — N179 Acute kidney failure, unspecified: Secondary | ICD-10-CM | POA: Diagnosis not present

## 2019-12-23 DIAGNOSIS — B965 Pseudomonas (aeruginosa) (mallei) (pseudomallei) as the cause of diseases classified elsewhere: Secondary | ICD-10-CM | POA: Diagnosis not present

## 2019-12-23 DIAGNOSIS — G9341 Metabolic encephalopathy: Secondary | ICD-10-CM | POA: Diagnosis not present

## 2019-12-23 DIAGNOSIS — I2510 Atherosclerotic heart disease of native coronary artery without angina pectoris: Secondary | ICD-10-CM | POA: Diagnosis not present

## 2019-12-24 DIAGNOSIS — I5032 Chronic diastolic (congestive) heart failure: Secondary | ICD-10-CM | POA: Diagnosis not present

## 2019-12-24 DIAGNOSIS — D638 Anemia in other chronic diseases classified elsewhere: Secondary | ICD-10-CM | POA: Diagnosis not present

## 2019-12-24 DIAGNOSIS — I129 Hypertensive chronic kidney disease with stage 1 through stage 4 chronic kidney disease, or unspecified chronic kidney disease: Secondary | ICD-10-CM | POA: Diagnosis not present

## 2019-12-24 DIAGNOSIS — E1122 Type 2 diabetes mellitus with diabetic chronic kidney disease: Secondary | ICD-10-CM | POA: Diagnosis not present

## 2019-12-24 DIAGNOSIS — N189 Chronic kidney disease, unspecified: Secondary | ICD-10-CM | POA: Diagnosis not present

## 2019-12-26 DIAGNOSIS — I130 Hypertensive heart and chronic kidney disease with heart failure and stage 1 through stage 4 chronic kidney disease, or unspecified chronic kidney disease: Secondary | ICD-10-CM | POA: Diagnosis not present

## 2019-12-26 DIAGNOSIS — B965 Pseudomonas (aeruginosa) (mallei) (pseudomallei) as the cause of diseases classified elsewhere: Secondary | ICD-10-CM | POA: Diagnosis not present

## 2019-12-26 DIAGNOSIS — I5032 Chronic diastolic (congestive) heart failure: Secondary | ICD-10-CM | POA: Diagnosis not present

## 2019-12-26 DIAGNOSIS — N390 Urinary tract infection, site not specified: Secondary | ICD-10-CM | POA: Diagnosis not present

## 2019-12-26 DIAGNOSIS — I2510 Atherosclerotic heart disease of native coronary artery without angina pectoris: Secondary | ICD-10-CM | POA: Diagnosis not present

## 2019-12-26 DIAGNOSIS — D631 Anemia in chronic kidney disease: Secondary | ICD-10-CM | POA: Diagnosis not present

## 2019-12-26 DIAGNOSIS — B952 Enterococcus as the cause of diseases classified elsewhere: Secondary | ICD-10-CM | POA: Diagnosis not present

## 2019-12-26 DIAGNOSIS — N179 Acute kidney failure, unspecified: Secondary | ICD-10-CM | POA: Diagnosis not present

## 2019-12-26 DIAGNOSIS — N184 Chronic kidney disease, stage 4 (severe): Secondary | ICD-10-CM | POA: Diagnosis not present

## 2019-12-26 DIAGNOSIS — E1122 Type 2 diabetes mellitus with diabetic chronic kidney disease: Secondary | ICD-10-CM | POA: Diagnosis not present

## 2019-12-26 DIAGNOSIS — E1165 Type 2 diabetes mellitus with hyperglycemia: Secondary | ICD-10-CM | POA: Diagnosis not present

## 2019-12-26 DIAGNOSIS — G9341 Metabolic encephalopathy: Secondary | ICD-10-CM | POA: Diagnosis not present

## 2019-12-27 DIAGNOSIS — D631 Anemia in chronic kidney disease: Secondary | ICD-10-CM | POA: Diagnosis not present

## 2019-12-27 DIAGNOSIS — N390 Urinary tract infection, site not specified: Secondary | ICD-10-CM | POA: Diagnosis not present

## 2019-12-27 DIAGNOSIS — I130 Hypertensive heart and chronic kidney disease with heart failure and stage 1 through stage 4 chronic kidney disease, or unspecified chronic kidney disease: Secondary | ICD-10-CM | POA: Diagnosis not present

## 2019-12-27 DIAGNOSIS — N184 Chronic kidney disease, stage 4 (severe): Secondary | ICD-10-CM | POA: Diagnosis not present

## 2019-12-27 DIAGNOSIS — G9341 Metabolic encephalopathy: Secondary | ICD-10-CM | POA: Diagnosis not present

## 2019-12-27 DIAGNOSIS — B952 Enterococcus as the cause of diseases classified elsewhere: Secondary | ICD-10-CM | POA: Diagnosis not present

## 2019-12-27 DIAGNOSIS — N179 Acute kidney failure, unspecified: Secondary | ICD-10-CM | POA: Diagnosis not present

## 2019-12-27 DIAGNOSIS — B965 Pseudomonas (aeruginosa) (mallei) (pseudomallei) as the cause of diseases classified elsewhere: Secondary | ICD-10-CM | POA: Diagnosis not present

## 2019-12-27 DIAGNOSIS — I2510 Atherosclerotic heart disease of native coronary artery without angina pectoris: Secondary | ICD-10-CM | POA: Diagnosis not present

## 2019-12-27 DIAGNOSIS — E1165 Type 2 diabetes mellitus with hyperglycemia: Secondary | ICD-10-CM | POA: Diagnosis not present

## 2019-12-27 DIAGNOSIS — E1122 Type 2 diabetes mellitus with diabetic chronic kidney disease: Secondary | ICD-10-CM | POA: Diagnosis not present

## 2019-12-27 DIAGNOSIS — I5032 Chronic diastolic (congestive) heart failure: Secondary | ICD-10-CM | POA: Diagnosis not present

## 2019-12-28 ENCOUNTER — Encounter: Payer: Medicare Other | Admitting: Physical Therapy

## 2019-12-28 DIAGNOSIS — I5032 Chronic diastolic (congestive) heart failure: Secondary | ICD-10-CM | POA: Diagnosis not present

## 2019-12-28 DIAGNOSIS — N390 Urinary tract infection, site not specified: Secondary | ICD-10-CM | POA: Diagnosis not present

## 2019-12-28 DIAGNOSIS — D631 Anemia in chronic kidney disease: Secondary | ICD-10-CM | POA: Diagnosis not present

## 2019-12-28 DIAGNOSIS — E1165 Type 2 diabetes mellitus with hyperglycemia: Secondary | ICD-10-CM | POA: Diagnosis not present

## 2019-12-28 DIAGNOSIS — B965 Pseudomonas (aeruginosa) (mallei) (pseudomallei) as the cause of diseases classified elsewhere: Secondary | ICD-10-CM | POA: Diagnosis not present

## 2019-12-28 DIAGNOSIS — B952 Enterococcus as the cause of diseases classified elsewhere: Secondary | ICD-10-CM | POA: Diagnosis not present

## 2019-12-28 DIAGNOSIS — N179 Acute kidney failure, unspecified: Secondary | ICD-10-CM | POA: Diagnosis not present

## 2019-12-28 DIAGNOSIS — E1122 Type 2 diabetes mellitus with diabetic chronic kidney disease: Secondary | ICD-10-CM | POA: Diagnosis not present

## 2019-12-28 DIAGNOSIS — N184 Chronic kidney disease, stage 4 (severe): Secondary | ICD-10-CM | POA: Diagnosis not present

## 2019-12-28 DIAGNOSIS — G9341 Metabolic encephalopathy: Secondary | ICD-10-CM | POA: Diagnosis not present

## 2019-12-28 DIAGNOSIS — I2510 Atherosclerotic heart disease of native coronary artery without angina pectoris: Secondary | ICD-10-CM | POA: Diagnosis not present

## 2019-12-28 DIAGNOSIS — I130 Hypertensive heart and chronic kidney disease with heart failure and stage 1 through stage 4 chronic kidney disease, or unspecified chronic kidney disease: Secondary | ICD-10-CM | POA: Diagnosis not present

## 2019-12-29 ENCOUNTER — Other Ambulatory Visit: Payer: Self-pay | Admitting: *Deleted

## 2019-12-29 DIAGNOSIS — I5032 Chronic diastolic (congestive) heart failure: Secondary | ICD-10-CM | POA: Diagnosis not present

## 2019-12-29 DIAGNOSIS — G9341 Metabolic encephalopathy: Secondary | ICD-10-CM | POA: Diagnosis not present

## 2019-12-29 DIAGNOSIS — B952 Enterococcus as the cause of diseases classified elsewhere: Secondary | ICD-10-CM | POA: Diagnosis not present

## 2019-12-29 DIAGNOSIS — I2510 Atherosclerotic heart disease of native coronary artery without angina pectoris: Secondary | ICD-10-CM | POA: Diagnosis not present

## 2019-12-29 DIAGNOSIS — R3914 Feeling of incomplete bladder emptying: Secondary | ICD-10-CM | POA: Diagnosis not present

## 2019-12-29 DIAGNOSIS — D631 Anemia in chronic kidney disease: Secondary | ICD-10-CM | POA: Diagnosis not present

## 2019-12-29 DIAGNOSIS — B965 Pseudomonas (aeruginosa) (mallei) (pseudomallei) as the cause of diseases classified elsewhere: Secondary | ICD-10-CM | POA: Diagnosis not present

## 2019-12-29 DIAGNOSIS — N184 Chronic kidney disease, stage 4 (severe): Secondary | ICD-10-CM | POA: Diagnosis not present

## 2019-12-29 DIAGNOSIS — I130 Hypertensive heart and chronic kidney disease with heart failure and stage 1 through stage 4 chronic kidney disease, or unspecified chronic kidney disease: Secondary | ICD-10-CM | POA: Diagnosis not present

## 2019-12-29 DIAGNOSIS — E1165 Type 2 diabetes mellitus with hyperglycemia: Secondary | ICD-10-CM | POA: Diagnosis not present

## 2019-12-29 DIAGNOSIS — E1122 Type 2 diabetes mellitus with diabetic chronic kidney disease: Secondary | ICD-10-CM | POA: Diagnosis not present

## 2019-12-29 DIAGNOSIS — N179 Acute kidney failure, unspecified: Secondary | ICD-10-CM | POA: Diagnosis not present

## 2019-12-29 DIAGNOSIS — N390 Urinary tract infection, site not specified: Secondary | ICD-10-CM | POA: Diagnosis not present

## 2019-12-29 NOTE — Patient Outreach (Signed)
[facility]) Care Management  12/29/2019  [name] Dec 31, 1927 [id number]   Outgoing call placed to member's son, [name], he state they are in the process of going to a doctor's appointment and don't have time to talk.  Request to call back later.  Will await call back, if no call back will follow up within the next 3-4 business days.  [name], [place], MSN [place] [phone]

## 2019-12-30 ENCOUNTER — Encounter: Payer: Medicare Other | Admitting: Physical Therapy

## 2020-01-03 ENCOUNTER — Encounter: Payer: Medicare Other | Admitting: Physical Therapy

## 2020-01-03 DIAGNOSIS — G9341 Metabolic encephalopathy: Secondary | ICD-10-CM | POA: Diagnosis not present

## 2020-01-03 DIAGNOSIS — B952 Enterococcus as the cause of diseases classified elsewhere: Secondary | ICD-10-CM | POA: Diagnosis not present

## 2020-01-03 DIAGNOSIS — I2510 Atherosclerotic heart disease of native coronary artery without angina pectoris: Secondary | ICD-10-CM | POA: Diagnosis not present

## 2020-01-03 DIAGNOSIS — N390 Urinary tract infection, site not specified: Secondary | ICD-10-CM | POA: Diagnosis not present

## 2020-01-03 DIAGNOSIS — E1122 Type 2 diabetes mellitus with diabetic chronic kidney disease: Secondary | ICD-10-CM | POA: Diagnosis not present

## 2020-01-03 DIAGNOSIS — N179 Acute kidney failure, unspecified: Secondary | ICD-10-CM | POA: Diagnosis not present

## 2020-01-03 DIAGNOSIS — I130 Hypertensive heart and chronic kidney disease with heart failure and stage 1 through stage 4 chronic kidney disease, or unspecified chronic kidney disease: Secondary | ICD-10-CM | POA: Diagnosis not present

## 2020-01-03 DIAGNOSIS — B965 Pseudomonas (aeruginosa) (mallei) (pseudomallei) as the cause of diseases classified elsewhere: Secondary | ICD-10-CM | POA: Diagnosis not present

## 2020-01-03 DIAGNOSIS — N184 Chronic kidney disease, stage 4 (severe): Secondary | ICD-10-CM | POA: Diagnosis not present

## 2020-01-03 DIAGNOSIS — D631 Anemia in chronic kidney disease: Secondary | ICD-10-CM | POA: Diagnosis not present

## 2020-01-03 DIAGNOSIS — I5032 Chronic diastolic (congestive) heart failure: Secondary | ICD-10-CM | POA: Diagnosis not present

## 2020-01-03 DIAGNOSIS — E1165 Type 2 diabetes mellitus with hyperglycemia: Secondary | ICD-10-CM | POA: Diagnosis not present

## 2020-01-04 ENCOUNTER — Other Ambulatory Visit: Payer: Self-pay | Admitting: *Deleted

## 2020-01-04 DIAGNOSIS — N390 Urinary tract infection, site not specified: Secondary | ICD-10-CM | POA: Diagnosis not present

## 2020-01-04 DIAGNOSIS — B965 Pseudomonas (aeruginosa) (mallei) (pseudomallei) as the cause of diseases classified elsewhere: Secondary | ICD-10-CM | POA: Diagnosis not present

## 2020-01-04 DIAGNOSIS — E1165 Type 2 diabetes mellitus with hyperglycemia: Secondary | ICD-10-CM | POA: Diagnosis not present

## 2020-01-04 NOTE — Patient Outreach (Signed)
[place] [facility]) Care Management  01/04/2020  [name] 04-01-1927 [id number]   Outreach attempt #2 to son, unsuccessful, HIPAA compliant voice message left.  Will send outreach letter and follow up within the next 3-4 business days.  [name], [place], MSN [place] [phone]

## 2020-01-05 ENCOUNTER — Encounter: Payer: Medicare Other | Admitting: Physical Therapy

## 2020-01-06 DIAGNOSIS — N179 Acute kidney failure, unspecified: Secondary | ICD-10-CM | POA: Diagnosis not present

## 2020-01-06 DIAGNOSIS — N184 Chronic kidney disease, stage 4 (severe): Secondary | ICD-10-CM | POA: Diagnosis not present

## 2020-01-06 DIAGNOSIS — G9341 Metabolic encephalopathy: Secondary | ICD-10-CM | POA: Diagnosis not present

## 2020-01-06 DIAGNOSIS — B965 Pseudomonas (aeruginosa) (mallei) (pseudomallei) as the cause of diseases classified elsewhere: Secondary | ICD-10-CM | POA: Diagnosis not present

## 2020-01-06 DIAGNOSIS — D631 Anemia in chronic kidney disease: Secondary | ICD-10-CM | POA: Diagnosis not present

## 2020-01-06 DIAGNOSIS — B952 Enterococcus as the cause of diseases classified elsewhere: Secondary | ICD-10-CM | POA: Diagnosis not present

## 2020-01-06 DIAGNOSIS — N390 Urinary tract infection, site not specified: Secondary | ICD-10-CM | POA: Diagnosis not present

## 2020-01-06 DIAGNOSIS — I130 Hypertensive heart and chronic kidney disease with heart failure and stage 1 through stage 4 chronic kidney disease, or unspecified chronic kidney disease: Secondary | ICD-10-CM | POA: Diagnosis not present

## 2020-01-06 DIAGNOSIS — E1122 Type 2 diabetes mellitus with diabetic chronic kidney disease: Secondary | ICD-10-CM | POA: Diagnosis not present

## 2020-01-06 DIAGNOSIS — I2510 Atherosclerotic heart disease of native coronary artery without angina pectoris: Secondary | ICD-10-CM | POA: Diagnosis not present

## 2020-01-06 DIAGNOSIS — E1165 Type 2 diabetes mellitus with hyperglycemia: Secondary | ICD-10-CM | POA: Diagnosis not present

## 2020-01-06 DIAGNOSIS — I5032 Chronic diastolic (congestive) heart failure: Secondary | ICD-10-CM | POA: Diagnosis not present

## 2020-01-11 ENCOUNTER — Other Ambulatory Visit: Payer: Self-pay | Admitting: *Deleted

## 2020-01-11 NOTE — Patient Outreach (Signed)
[facility]) Care Management  01/11/2020  GUERINO CAPORALE [birth year] [id number]   Outreach attempt #3, successful to daughter.  She report member has been doing better since his admission to the hospital 11/22-11/29 for pneumonia.  Denies any lingering effects or shortness of breath.  Was seen by PCP on 12/3, will follow up in 3 months.  Blood sugars are ranging 130-200's, with most readings 150-160's.  Last A1C was checked in September (8.5), was not rechecked during last visit.  Call was placed to PCP office to inquire about checking, will have home health nurse do labs.  RN is in the home once a week and PT twice a week.  Plan to follow up in PCP office within 3 months.    He also has frequent follow ups with nephrology, last visit with 12/10, will have follow up on 1/14.  He has a chronic foley, last changed almost 2 weeks ago, will have new one placed on 1/18.  Blood pressure recently was 130/88, monitoring daily with recent medication changes, unable to stand with daily weights.  Will have office visit with cardiology on 2/23.    She report they are able to get him out of the home a couple times a week to improve mood/mental state, taking Covid precautions when out.  Denies any urgent concerns, encouraged to contact this care manager with questions.  Agrees to follow up within the next month.  Goals Addressed            This Visit's Progress   . [facility] - Monitor and Manage My Blood Sugar   On track    Follow Up Date 02/08/2020  Timeframe:  Short-Term Goal Priority:  Medium Start Date:          01/11/2020                   Expected End Date:   02/11/2020                      - check blood sugar at prescribed times - check blood sugar if I feel it is too high or too low - take the blood sugar log to all doctor visits    Why is this important?   Checking your blood sugar at home helps to keep it from getting very high or very low.  Writing the results in a diary or  log helps the doctor know how to care for you.  Your blood sugar log should have the time, date and the results.  Also, write down the amount of insulin or other medicine that you take.  Other information, like what you ate, exercise done and how you were feeling, will also be helpful.     Notes:   11/18 - blood sugar readings reviewed, discussed compliance with medication regime  12/28 - Discussed proper diabetic and low sodium diet    . [facility] - Set My Target A1C   On track    Follow Up Date 02/08/2020  Timeframe:  Long-Range Goal Priority:  Medium Start Date:       01/11/2020                      Expected End Date:   03/13/2020                      - set target A1C (7)    Why is this important?   Your  target A1C is decided together by you and your doctor.  It is based on several things like your age and other health issues.    Notes:   11/18 - Discussed dose change for oral diabetic agents  12/28 - Call placed to PCP office requesting order for new A1C lab    . [facility] -Improve My Heart Health   On track    Follow Up Date 02/08/2020  Timeframe:  Short-Term Goal Priority:  High Start Date:    01/11/2020                         Expected End Date:       02/11/2020                  - be open to making changes - if I have chest pain, call for help    Why is this important?   Lifestyle changes are key to improving the blood flow to your heart. Think about the things you can change and set a goal to live healthy.  Remember, when the blood vessels to your heart start to get clogged you may not have any symptoms.  Over time, they can get worse.  Don't ignore the signs, like chest pain, and get help right away.     Notes:   11/18 - discussed antihypertensive dose change and BP trends  12/28 - Reviewed BP trends since medication changes, encouraged to continue to monitor      [name], RN, MSN [place] Manager [phone]

## 2020-01-12 ENCOUNTER — Encounter: Payer: Medicare Other | Admitting: Physical Therapy

## 2020-01-12 DIAGNOSIS — I2510 Atherosclerotic heart disease of native coronary artery without angina pectoris: Secondary | ICD-10-CM | POA: Diagnosis not present

## 2020-01-12 DIAGNOSIS — B952 Enterococcus as the cause of diseases classified elsewhere: Secondary | ICD-10-CM | POA: Diagnosis not present

## 2020-01-12 DIAGNOSIS — N390 Urinary tract infection, site not specified: Secondary | ICD-10-CM | POA: Diagnosis not present

## 2020-01-12 DIAGNOSIS — N179 Acute kidney failure, unspecified: Secondary | ICD-10-CM | POA: Diagnosis not present

## 2020-01-12 DIAGNOSIS — E1122 Type 2 diabetes mellitus with diabetic chronic kidney disease: Secondary | ICD-10-CM | POA: Diagnosis not present

## 2020-01-12 DIAGNOSIS — I5032 Chronic diastolic (congestive) heart failure: Secondary | ICD-10-CM | POA: Diagnosis not present

## 2020-01-12 DIAGNOSIS — N184 Chronic kidney disease, stage 4 (severe): Secondary | ICD-10-CM | POA: Diagnosis not present

## 2020-01-12 DIAGNOSIS — G9341 Metabolic encephalopathy: Secondary | ICD-10-CM | POA: Diagnosis not present

## 2020-01-12 DIAGNOSIS — I130 Hypertensive heart and chronic kidney disease with heart failure and stage 1 through stage 4 chronic kidney disease, or unspecified chronic kidney disease: Secondary | ICD-10-CM | POA: Diagnosis not present

## 2020-01-12 DIAGNOSIS — D631 Anemia in chronic kidney disease: Secondary | ICD-10-CM | POA: Diagnosis not present

## 2020-01-12 DIAGNOSIS — E1165 Type 2 diabetes mellitus with hyperglycemia: Secondary | ICD-10-CM | POA: Diagnosis not present

## 2020-01-12 DIAGNOSIS — B965 Pseudomonas (aeruginosa) (mallei) (pseudomallei) as the cause of diseases classified elsewhere: Secondary | ICD-10-CM | POA: Diagnosis not present

## 2020-01-13 ENCOUNTER — Encounter: Payer: Medicare Other | Admitting: Physical Therapy

## 2020-01-13 DIAGNOSIS — D631 Anemia in chronic kidney disease: Secondary | ICD-10-CM | POA: Diagnosis not present

## 2020-01-13 DIAGNOSIS — I2510 Atherosclerotic heart disease of native coronary artery without angina pectoris: Secondary | ICD-10-CM | POA: Diagnosis not present

## 2020-01-13 DIAGNOSIS — G9341 Metabolic encephalopathy: Secondary | ICD-10-CM | POA: Diagnosis not present

## 2020-01-13 DIAGNOSIS — I130 Hypertensive heart and chronic kidney disease with heart failure and stage 1 through stage 4 chronic kidney disease, or unspecified chronic kidney disease: Secondary | ICD-10-CM | POA: Diagnosis not present

## 2020-01-13 DIAGNOSIS — B965 Pseudomonas (aeruginosa) (mallei) (pseudomallei) as the cause of diseases classified elsewhere: Secondary | ICD-10-CM | POA: Diagnosis not present

## 2020-01-13 DIAGNOSIS — B952 Enterococcus as the cause of diseases classified elsewhere: Secondary | ICD-10-CM | POA: Diagnosis not present

## 2020-01-13 DIAGNOSIS — I5032 Chronic diastolic (congestive) heart failure: Secondary | ICD-10-CM | POA: Diagnosis not present

## 2020-01-13 DIAGNOSIS — E1122 Type 2 diabetes mellitus with diabetic chronic kidney disease: Secondary | ICD-10-CM | POA: Diagnosis not present

## 2020-01-13 DIAGNOSIS — N390 Urinary tract infection, site not specified: Secondary | ICD-10-CM | POA: Diagnosis not present

## 2020-01-13 DIAGNOSIS — N184 Chronic kidney disease, stage 4 (severe): Secondary | ICD-10-CM | POA: Diagnosis not present

## 2020-01-13 DIAGNOSIS — N179 Acute kidney failure, unspecified: Secondary | ICD-10-CM | POA: Diagnosis not present

## 2020-01-13 DIAGNOSIS — E1165 Type 2 diabetes mellitus with hyperglycemia: Secondary | ICD-10-CM | POA: Diagnosis not present

## 2020-01-14 DIAGNOSIS — I129 Hypertensive chronic kidney disease with stage 1 through stage 4 chronic kidney disease, or unspecified chronic kidney disease: Secondary | ICD-10-CM | POA: Diagnosis not present

## 2020-01-14 DIAGNOSIS — I4891 Unspecified atrial fibrillation: Secondary | ICD-10-CM | POA: Diagnosis not present

## 2020-01-14 DIAGNOSIS — E7849 Other hyperlipidemia: Secondary | ICD-10-CM | POA: Diagnosis not present

## 2020-01-14 DIAGNOSIS — E1122 Type 2 diabetes mellitus with diabetic chronic kidney disease: Secondary | ICD-10-CM | POA: Diagnosis not present

## 2020-01-17 DIAGNOSIS — N390 Urinary tract infection, site not specified: Secondary | ICD-10-CM | POA: Diagnosis not present

## 2020-01-17 DIAGNOSIS — I5032 Chronic diastolic (congestive) heart failure: Secondary | ICD-10-CM | POA: Diagnosis not present

## 2020-01-17 DIAGNOSIS — I130 Hypertensive heart and chronic kidney disease with heart failure and stage 1 through stage 4 chronic kidney disease, or unspecified chronic kidney disease: Secondary | ICD-10-CM | POA: Diagnosis not present

## 2020-01-17 DIAGNOSIS — E1165 Type 2 diabetes mellitus with hyperglycemia: Secondary | ICD-10-CM | POA: Diagnosis not present

## 2020-01-17 DIAGNOSIS — G9341 Metabolic encephalopathy: Secondary | ICD-10-CM | POA: Diagnosis not present

## 2020-01-17 DIAGNOSIS — B952 Enterococcus as the cause of diseases classified elsewhere: Secondary | ICD-10-CM | POA: Diagnosis not present

## 2020-01-17 DIAGNOSIS — B965 Pseudomonas (aeruginosa) (mallei) (pseudomallei) as the cause of diseases classified elsewhere: Secondary | ICD-10-CM | POA: Diagnosis not present

## 2020-01-17 DIAGNOSIS — N184 Chronic kidney disease, stage 4 (severe): Secondary | ICD-10-CM | POA: Diagnosis not present

## 2020-01-17 DIAGNOSIS — N179 Acute kidney failure, unspecified: Secondary | ICD-10-CM | POA: Diagnosis not present

## 2020-01-17 DIAGNOSIS — D631 Anemia in chronic kidney disease: Secondary | ICD-10-CM | POA: Diagnosis not present

## 2020-01-17 DIAGNOSIS — E1122 Type 2 diabetes mellitus with diabetic chronic kidney disease: Secondary | ICD-10-CM | POA: Diagnosis not present

## 2020-01-17 DIAGNOSIS — I2510 Atherosclerotic heart disease of native coronary artery without angina pectoris: Secondary | ICD-10-CM | POA: Diagnosis not present

## 2020-01-19 DIAGNOSIS — N184 Chronic kidney disease, stage 4 (severe): Secondary | ICD-10-CM | POA: Diagnosis not present

## 2020-01-19 DIAGNOSIS — B952 Enterococcus as the cause of diseases classified elsewhere: Secondary | ICD-10-CM | POA: Diagnosis not present

## 2020-01-19 DIAGNOSIS — I2510 Atherosclerotic heart disease of native coronary artery without angina pectoris: Secondary | ICD-10-CM | POA: Diagnosis not present

## 2020-01-19 DIAGNOSIS — I5032 Chronic diastolic (congestive) heart failure: Secondary | ICD-10-CM | POA: Diagnosis not present

## 2020-01-19 DIAGNOSIS — G9341 Metabolic encephalopathy: Secondary | ICD-10-CM | POA: Diagnosis not present

## 2020-01-19 DIAGNOSIS — E1165 Type 2 diabetes mellitus with hyperglycemia: Secondary | ICD-10-CM | POA: Diagnosis not present

## 2020-01-19 DIAGNOSIS — D631 Anemia in chronic kidney disease: Secondary | ICD-10-CM | POA: Diagnosis not present

## 2020-01-19 DIAGNOSIS — I130 Hypertensive heart and chronic kidney disease with heart failure and stage 1 through stage 4 chronic kidney disease, or unspecified chronic kidney disease: Secondary | ICD-10-CM | POA: Diagnosis not present

## 2020-01-19 DIAGNOSIS — E1122 Type 2 diabetes mellitus with diabetic chronic kidney disease: Secondary | ICD-10-CM | POA: Diagnosis not present

## 2020-01-19 DIAGNOSIS — B965 Pseudomonas (aeruginosa) (mallei) (pseudomallei) as the cause of diseases classified elsewhere: Secondary | ICD-10-CM | POA: Diagnosis not present

## 2020-01-19 DIAGNOSIS — N179 Acute kidney failure, unspecified: Secondary | ICD-10-CM | POA: Diagnosis not present

## 2020-01-19 DIAGNOSIS — N390 Urinary tract infection, site not specified: Secondary | ICD-10-CM | POA: Diagnosis not present

## 2020-01-19 DIAGNOSIS — E119 Type 2 diabetes mellitus without complications: Secondary | ICD-10-CM | POA: Diagnosis not present

## 2020-01-21 DIAGNOSIS — N184 Chronic kidney disease, stage 4 (severe): Secondary | ICD-10-CM | POA: Diagnosis not present

## 2020-01-21 DIAGNOSIS — E1122 Type 2 diabetes mellitus with diabetic chronic kidney disease: Secondary | ICD-10-CM | POA: Diagnosis not present

## 2020-01-21 DIAGNOSIS — I2510 Atherosclerotic heart disease of native coronary artery without angina pectoris: Secondary | ICD-10-CM | POA: Diagnosis not present

## 2020-01-21 DIAGNOSIS — B952 Enterococcus as the cause of diseases classified elsewhere: Secondary | ICD-10-CM | POA: Diagnosis not present

## 2020-01-21 DIAGNOSIS — I130 Hypertensive heart and chronic kidney disease with heart failure and stage 1 through stage 4 chronic kidney disease, or unspecified chronic kidney disease: Secondary | ICD-10-CM | POA: Diagnosis not present

## 2020-01-21 DIAGNOSIS — N390 Urinary tract infection, site not specified: Secondary | ICD-10-CM | POA: Diagnosis not present

## 2020-01-21 DIAGNOSIS — N179 Acute kidney failure, unspecified: Secondary | ICD-10-CM | POA: Diagnosis not present

## 2020-01-21 DIAGNOSIS — B965 Pseudomonas (aeruginosa) (mallei) (pseudomallei) as the cause of diseases classified elsewhere: Secondary | ICD-10-CM | POA: Diagnosis not present

## 2020-01-21 DIAGNOSIS — D631 Anemia in chronic kidney disease: Secondary | ICD-10-CM | POA: Diagnosis not present

## 2020-01-21 DIAGNOSIS — E1165 Type 2 diabetes mellitus with hyperglycemia: Secondary | ICD-10-CM | POA: Diagnosis not present

## 2020-01-21 DIAGNOSIS — I5032 Chronic diastolic (congestive) heart failure: Secondary | ICD-10-CM | POA: Diagnosis not present

## 2020-01-21 DIAGNOSIS — G9341 Metabolic encephalopathy: Secondary | ICD-10-CM | POA: Diagnosis not present

## 2020-01-24 DIAGNOSIS — N179 Acute kidney failure, unspecified: Secondary | ICD-10-CM | POA: Diagnosis not present

## 2020-01-24 DIAGNOSIS — N189 Chronic kidney disease, unspecified: Secondary | ICD-10-CM | POA: Diagnosis not present

## 2020-01-24 DIAGNOSIS — E559 Vitamin D deficiency, unspecified: Secondary | ICD-10-CM | POA: Diagnosis not present

## 2020-01-24 DIAGNOSIS — B965 Pseudomonas (aeruginosa) (mallei) (pseudomallei) as the cause of diseases classified elsewhere: Secondary | ICD-10-CM | POA: Diagnosis not present

## 2020-01-24 DIAGNOSIS — E1122 Type 2 diabetes mellitus with diabetic chronic kidney disease: Secondary | ICD-10-CM | POA: Diagnosis not present

## 2020-01-24 DIAGNOSIS — G9341 Metabolic encephalopathy: Secondary | ICD-10-CM | POA: Diagnosis not present

## 2020-01-24 DIAGNOSIS — E1165 Type 2 diabetes mellitus with hyperglycemia: Secondary | ICD-10-CM | POA: Diagnosis not present

## 2020-01-24 DIAGNOSIS — I2510 Atherosclerotic heart disease of native coronary artery without angina pectoris: Secondary | ICD-10-CM | POA: Diagnosis not present

## 2020-01-24 DIAGNOSIS — I5032 Chronic diastolic (congestive) heart failure: Secondary | ICD-10-CM | POA: Diagnosis not present

## 2020-01-24 DIAGNOSIS — D631 Anemia in chronic kidney disease: Secondary | ICD-10-CM | POA: Diagnosis not present

## 2020-01-24 DIAGNOSIS — N390 Urinary tract infection, site not specified: Secondary | ICD-10-CM | POA: Diagnosis not present

## 2020-01-24 DIAGNOSIS — D509 Iron deficiency anemia, unspecified: Secondary | ICD-10-CM | POA: Diagnosis not present

## 2020-01-24 DIAGNOSIS — N184 Chronic kidney disease, stage 4 (severe): Secondary | ICD-10-CM | POA: Diagnosis not present

## 2020-01-24 DIAGNOSIS — B952 Enterococcus as the cause of diseases classified elsewhere: Secondary | ICD-10-CM | POA: Diagnosis not present

## 2020-01-24 DIAGNOSIS — I130 Hypertensive heart and chronic kidney disease with heart failure and stage 1 through stage 4 chronic kidney disease, or unspecified chronic kidney disease: Secondary | ICD-10-CM | POA: Diagnosis not present

## 2020-01-26 DIAGNOSIS — B965 Pseudomonas (aeruginosa) (mallei) (pseudomallei) as the cause of diseases classified elsewhere: Secondary | ICD-10-CM | POA: Diagnosis not present

## 2020-01-26 DIAGNOSIS — N184 Chronic kidney disease, stage 4 (severe): Secondary | ICD-10-CM | POA: Diagnosis not present

## 2020-01-26 DIAGNOSIS — I2510 Atherosclerotic heart disease of native coronary artery without angina pectoris: Secondary | ICD-10-CM | POA: Diagnosis not present

## 2020-01-26 DIAGNOSIS — N390 Urinary tract infection, site not specified: Secondary | ICD-10-CM | POA: Diagnosis not present

## 2020-01-26 DIAGNOSIS — D631 Anemia in chronic kidney disease: Secondary | ICD-10-CM | POA: Diagnosis not present

## 2020-01-26 DIAGNOSIS — B952 Enterococcus as the cause of diseases classified elsewhere: Secondary | ICD-10-CM | POA: Diagnosis not present

## 2020-01-26 DIAGNOSIS — N179 Acute kidney failure, unspecified: Secondary | ICD-10-CM | POA: Diagnosis not present

## 2020-01-26 DIAGNOSIS — E1165 Type 2 diabetes mellitus with hyperglycemia: Secondary | ICD-10-CM | POA: Diagnosis not present

## 2020-01-26 DIAGNOSIS — E1122 Type 2 diabetes mellitus with diabetic chronic kidney disease: Secondary | ICD-10-CM | POA: Diagnosis not present

## 2020-01-26 DIAGNOSIS — I130 Hypertensive heart and chronic kidney disease with heart failure and stage 1 through stage 4 chronic kidney disease, or unspecified chronic kidney disease: Secondary | ICD-10-CM | POA: Diagnosis not present

## 2020-01-26 DIAGNOSIS — I5032 Chronic diastolic (congestive) heart failure: Secondary | ICD-10-CM | POA: Diagnosis not present

## 2020-01-26 DIAGNOSIS — G9341 Metabolic encephalopathy: Secondary | ICD-10-CM | POA: Diagnosis not present

## 2020-01-27 DIAGNOSIS — G9341 Metabolic encephalopathy: Secondary | ICD-10-CM | POA: Diagnosis not present

## 2020-01-27 DIAGNOSIS — I130 Hypertensive heart and chronic kidney disease with heart failure and stage 1 through stage 4 chronic kidney disease, or unspecified chronic kidney disease: Secondary | ICD-10-CM | POA: Diagnosis not present

## 2020-01-27 DIAGNOSIS — N179 Acute kidney failure, unspecified: Secondary | ICD-10-CM | POA: Diagnosis not present

## 2020-01-27 DIAGNOSIS — N184 Chronic kidney disease, stage 4 (severe): Secondary | ICD-10-CM | POA: Diagnosis not present

## 2020-01-27 DIAGNOSIS — B952 Enterococcus as the cause of diseases classified elsewhere: Secondary | ICD-10-CM | POA: Diagnosis not present

## 2020-01-27 DIAGNOSIS — E1122 Type 2 diabetes mellitus with diabetic chronic kidney disease: Secondary | ICD-10-CM | POA: Diagnosis not present

## 2020-01-27 DIAGNOSIS — B965 Pseudomonas (aeruginosa) (mallei) (pseudomallei) as the cause of diseases classified elsewhere: Secondary | ICD-10-CM | POA: Diagnosis not present

## 2020-01-27 DIAGNOSIS — E1165 Type 2 diabetes mellitus with hyperglycemia: Secondary | ICD-10-CM | POA: Diagnosis not present

## 2020-01-27 DIAGNOSIS — I2510 Atherosclerotic heart disease of native coronary artery without angina pectoris: Secondary | ICD-10-CM | POA: Diagnosis not present

## 2020-01-27 DIAGNOSIS — N390 Urinary tract infection, site not specified: Secondary | ICD-10-CM | POA: Diagnosis not present

## 2020-01-27 DIAGNOSIS — I5032 Chronic diastolic (congestive) heart failure: Secondary | ICD-10-CM | POA: Diagnosis not present

## 2020-01-27 DIAGNOSIS — D631 Anemia in chronic kidney disease: Secondary | ICD-10-CM | POA: Diagnosis not present

## 2020-01-28 DIAGNOSIS — D638 Anemia in other chronic diseases classified elsewhere: Secondary | ICD-10-CM | POA: Diagnosis not present

## 2020-01-28 DIAGNOSIS — E1122 Type 2 diabetes mellitus with diabetic chronic kidney disease: Secondary | ICD-10-CM | POA: Diagnosis not present

## 2020-01-28 DIAGNOSIS — I129 Hypertensive chronic kidney disease with stage 1 through stage 4 chronic kidney disease, or unspecified chronic kidney disease: Secondary | ICD-10-CM | POA: Diagnosis not present

## 2020-01-28 DIAGNOSIS — E876 Hypokalemia: Secondary | ICD-10-CM | POA: Diagnosis not present

## 2020-01-28 DIAGNOSIS — N189 Chronic kidney disease, unspecified: Secondary | ICD-10-CM | POA: Diagnosis not present

## 2020-01-28 DIAGNOSIS — R809 Proteinuria, unspecified: Secondary | ICD-10-CM | POA: Diagnosis not present

## 2020-01-28 DIAGNOSIS — E1129 Type 2 diabetes mellitus with other diabetic kidney complication: Secondary | ICD-10-CM | POA: Diagnosis not present

## 2020-01-28 DIAGNOSIS — I5032 Chronic diastolic (congestive) heart failure: Secondary | ICD-10-CM | POA: Diagnosis not present

## 2020-02-02 DIAGNOSIS — D631 Anemia in chronic kidney disease: Secondary | ICD-10-CM | POA: Diagnosis not present

## 2020-02-02 DIAGNOSIS — I130 Hypertensive heart and chronic kidney disease with heart failure and stage 1 through stage 4 chronic kidney disease, or unspecified chronic kidney disease: Secondary | ICD-10-CM | POA: Diagnosis not present

## 2020-02-02 DIAGNOSIS — E1165 Type 2 diabetes mellitus with hyperglycemia: Secondary | ICD-10-CM | POA: Diagnosis not present

## 2020-02-02 DIAGNOSIS — N179 Acute kidney failure, unspecified: Secondary | ICD-10-CM | POA: Diagnosis not present

## 2020-02-02 DIAGNOSIS — B952 Enterococcus as the cause of diseases classified elsewhere: Secondary | ICD-10-CM | POA: Diagnosis not present

## 2020-02-02 DIAGNOSIS — I2510 Atherosclerotic heart disease of native coronary artery without angina pectoris: Secondary | ICD-10-CM | POA: Diagnosis not present

## 2020-02-02 DIAGNOSIS — G9341 Metabolic encephalopathy: Secondary | ICD-10-CM | POA: Diagnosis not present

## 2020-02-02 DIAGNOSIS — I5032 Chronic diastolic (congestive) heart failure: Secondary | ICD-10-CM | POA: Diagnosis not present

## 2020-02-02 DIAGNOSIS — E1122 Type 2 diabetes mellitus with diabetic chronic kidney disease: Secondary | ICD-10-CM | POA: Diagnosis not present

## 2020-02-02 DIAGNOSIS — B965 Pseudomonas (aeruginosa) (mallei) (pseudomallei) as the cause of diseases classified elsewhere: Secondary | ICD-10-CM | POA: Diagnosis not present

## 2020-02-02 DIAGNOSIS — N390 Urinary tract infection, site not specified: Secondary | ICD-10-CM | POA: Diagnosis not present

## 2020-02-02 DIAGNOSIS — N184 Chronic kidney disease, stage 4 (severe): Secondary | ICD-10-CM | POA: Diagnosis not present

## 2020-02-03 DIAGNOSIS — E1122 Type 2 diabetes mellitus with diabetic chronic kidney disease: Secondary | ICD-10-CM | POA: Diagnosis not present

## 2020-02-03 DIAGNOSIS — I130 Hypertensive heart and chronic kidney disease with heart failure and stage 1 through stage 4 chronic kidney disease, or unspecified chronic kidney disease: Secondary | ICD-10-CM | POA: Diagnosis not present

## 2020-02-03 DIAGNOSIS — N390 Urinary tract infection, site not specified: Secondary | ICD-10-CM | POA: Diagnosis not present

## 2020-02-03 DIAGNOSIS — I2510 Atherosclerotic heart disease of native coronary artery without angina pectoris: Secondary | ICD-10-CM | POA: Diagnosis not present

## 2020-02-03 DIAGNOSIS — N184 Chronic kidney disease, stage 4 (severe): Secondary | ICD-10-CM | POA: Diagnosis not present

## 2020-02-03 DIAGNOSIS — B965 Pseudomonas (aeruginosa) (mallei) (pseudomallei) as the cause of diseases classified elsewhere: Secondary | ICD-10-CM | POA: Diagnosis not present

## 2020-02-03 DIAGNOSIS — N179 Acute kidney failure, unspecified: Secondary | ICD-10-CM | POA: Diagnosis not present

## 2020-02-03 DIAGNOSIS — E1165 Type 2 diabetes mellitus with hyperglycemia: Secondary | ICD-10-CM | POA: Diagnosis not present

## 2020-02-03 DIAGNOSIS — B952 Enterococcus as the cause of diseases classified elsewhere: Secondary | ICD-10-CM | POA: Diagnosis not present

## 2020-02-03 DIAGNOSIS — I5032 Chronic diastolic (congestive) heart failure: Secondary | ICD-10-CM | POA: Diagnosis not present

## 2020-02-03 DIAGNOSIS — G9341 Metabolic encephalopathy: Secondary | ICD-10-CM | POA: Diagnosis not present

## 2020-02-03 DIAGNOSIS — D631 Anemia in chronic kidney disease: Secondary | ICD-10-CM | POA: Diagnosis not present

## 2020-02-07 ENCOUNTER — Other Ambulatory Visit: Payer: Self-pay | Admitting: *Deleted

## 2020-02-07 DIAGNOSIS — G9341 Metabolic encephalopathy: Secondary | ICD-10-CM | POA: Diagnosis not present

## 2020-02-07 DIAGNOSIS — I5032 Chronic diastolic (congestive) heart failure: Secondary | ICD-10-CM | POA: Diagnosis not present

## 2020-02-07 DIAGNOSIS — N179 Acute kidney failure, unspecified: Secondary | ICD-10-CM | POA: Diagnosis not present

## 2020-02-07 DIAGNOSIS — E1122 Type 2 diabetes mellitus with diabetic chronic kidney disease: Secondary | ICD-10-CM | POA: Diagnosis not present

## 2020-02-07 DIAGNOSIS — I2510 Atherosclerotic heart disease of native coronary artery without angina pectoris: Secondary | ICD-10-CM | POA: Diagnosis not present

## 2020-02-07 DIAGNOSIS — D631 Anemia in chronic kidney disease: Secondary | ICD-10-CM | POA: Diagnosis not present

## 2020-02-07 DIAGNOSIS — B965 Pseudomonas (aeruginosa) (mallei) (pseudomallei) as the cause of diseases classified elsewhere: Secondary | ICD-10-CM | POA: Diagnosis not present

## 2020-02-07 DIAGNOSIS — I130 Hypertensive heart and chronic kidney disease with heart failure and stage 1 through stage 4 chronic kidney disease, or unspecified chronic kidney disease: Secondary | ICD-10-CM | POA: Diagnosis not present

## 2020-02-07 DIAGNOSIS — N390 Urinary tract infection, site not specified: Secondary | ICD-10-CM | POA: Diagnosis not present

## 2020-02-07 DIAGNOSIS — E1165 Type 2 diabetes mellitus with hyperglycemia: Secondary | ICD-10-CM | POA: Diagnosis not present

## 2020-02-07 DIAGNOSIS — B952 Enterococcus as the cause of diseases classified elsewhere: Secondary | ICD-10-CM | POA: Diagnosis not present

## 2020-02-07 DIAGNOSIS — N184 Chronic kidney disease, stage 4 (severe): Secondary | ICD-10-CM | POA: Diagnosis not present

## 2020-02-07 NOTE — Patient Outreach (Signed)
[facility]) Care Management  02/07/2020  [name] [birth year] [id number]   Outgoing call to member's daughter, no answer, HIPAA compliant voice message left.  Will follow up within the next 3-4 business days.  [name], [place], MSN [place] [phone]

## 2020-02-08 ENCOUNTER — Ambulatory Visit: Payer: Medicare Other | Admitting: *Deleted

## 2020-02-08 DIAGNOSIS — R809 Proteinuria, unspecified: Secondary | ICD-10-CM | POA: Diagnosis not present

## 2020-02-08 DIAGNOSIS — N189 Chronic kidney disease, unspecified: Secondary | ICD-10-CM | POA: Diagnosis not present

## 2020-02-08 DIAGNOSIS — E1122 Type 2 diabetes mellitus with diabetic chronic kidney disease: Secondary | ICD-10-CM | POA: Diagnosis not present

## 2020-02-08 DIAGNOSIS — E1129 Type 2 diabetes mellitus with other diabetic kidney complication: Secondary | ICD-10-CM | POA: Diagnosis not present

## 2020-02-08 DIAGNOSIS — D638 Anemia in other chronic diseases classified elsewhere: Secondary | ICD-10-CM | POA: Diagnosis not present

## 2020-02-08 DIAGNOSIS — R3914 Feeling of incomplete bladder emptying: Secondary | ICD-10-CM | POA: Diagnosis not present

## 2020-02-09 ENCOUNTER — Other Ambulatory Visit: Payer: Self-pay

## 2020-02-09 ENCOUNTER — Other Ambulatory Visit: Payer: Medicare Other | Admitting: Nurse Practitioner

## 2020-02-09 DIAGNOSIS — I2510 Atherosclerotic heart disease of native coronary artery without angina pectoris: Secondary | ICD-10-CM | POA: Diagnosis not present

## 2020-02-09 DIAGNOSIS — E1122 Type 2 diabetes mellitus with diabetic chronic kidney disease: Secondary | ICD-10-CM | POA: Diagnosis not present

## 2020-02-09 DIAGNOSIS — I130 Hypertensive heart and chronic kidney disease with heart failure and stage 1 through stage 4 chronic kidney disease, or unspecified chronic kidney disease: Secondary | ICD-10-CM | POA: Diagnosis not present

## 2020-02-09 DIAGNOSIS — E1165 Type 2 diabetes mellitus with hyperglycemia: Secondary | ICD-10-CM | POA: Diagnosis not present

## 2020-02-09 DIAGNOSIS — L89302 Pressure ulcer of unspecified buttock, stage 2: Secondary | ICD-10-CM

## 2020-02-09 DIAGNOSIS — B952 Enterococcus as the cause of diseases classified elsewhere: Secondary | ICD-10-CM | POA: Diagnosis not present

## 2020-02-09 DIAGNOSIS — N179 Acute kidney failure, unspecified: Secondary | ICD-10-CM | POA: Diagnosis not present

## 2020-02-09 DIAGNOSIS — D631 Anemia in chronic kidney disease: Secondary | ICD-10-CM | POA: Diagnosis not present

## 2020-02-09 DIAGNOSIS — N390 Urinary tract infection, site not specified: Secondary | ICD-10-CM | POA: Diagnosis not present

## 2020-02-09 DIAGNOSIS — N184 Chronic kidney disease, stage 4 (severe): Secondary | ICD-10-CM | POA: Diagnosis not present

## 2020-02-09 DIAGNOSIS — B965 Pseudomonas (aeruginosa) (mallei) (pseudomallei) as the cause of diseases classified elsewhere: Secondary | ICD-10-CM | POA: Diagnosis not present

## 2020-02-09 DIAGNOSIS — G9341 Metabolic encephalopathy: Secondary | ICD-10-CM | POA: Diagnosis not present

## 2020-02-09 DIAGNOSIS — I5032 Chronic diastolic (congestive) heart failure: Secondary | ICD-10-CM | POA: Diagnosis not present

## 2020-02-09 DIAGNOSIS — Z515 Encounter for palliative care: Secondary | ICD-10-CM

## 2020-02-09 NOTE — Progress Notes (Signed)
[facility] Consult Note Telephone: [phone]  Fax: [phone]  PATIENT NAME: [name] [address] [place] [address] [phone] (home)  DOB: Feb 09, 1927 MRN: [id number]  PRIMARY CARE PROVIDER:    Redmond School, MD,  [address] [place] [address] [phone]  REFERRING PROVIDER:   Redmond School, [place] [employer] [place],  [place] [address] [phone]  RESPONSIBLE PARTY:   Extended Emergency Contact Information Primary Emergency Contact: [name],[name] Address: [address]          [place], [place] [address] [name] of [place] Phone: [phone] Relation: Daughter Secondary Emergency Contact: [name], [name], [name] [address] [name] of [place] Mobile Phone: [phone] Relation: Son  I met face to face with patient and son [name] in home.   ASSESSMENT AND RECOMMENDATIONS:   Advance Care Planning:  Patient's goal of care is function and comfort. Patient desires CPR but not intubation. Family not ready to complete MOST form today. Will complete form when family is ready.  Cognitive / Functional decline/Symptom Management:  Patient awake and alert, hard of hearing, uses prosthesis due to bilateral BKA. Patient dependent on family for his ADLs, able to feed self. He is continent of bowel, has chronic foley cath. He is mostly wheelchair dependent for ambulation, no report of falls.Family report good appetite, no report of insomnia, no report of uncontrolled pain.  Wound : Stage 2 pressure ulcer to bilateral buttocks. Receives wound care by home health nursing from [facility] once a week. Nurse present during visit today, report wound has declined. Site noted to be erythematous with mid serous drainage. Family report applying neosporin to site, and dressing with dry gauze. No report of fever, chills, or uncontrolled pain. Diabetes is controlled on current regimen of  Glipizide 68m twice a day. Family report fasting blood sugars to be less than 180. Recommendation: Recommend dressing wound with silvadene cream daily. Recommend pressure reducing cushion for chair to reduce pressure. Recommend lying patient down routinely to relief pressure on buttocks.  Follow up Palliative Care Visit: Palliative care will continue to follow for goals of care clarification and symptom management. Return in about 4 weeks or prn.  Family /Caregiver/Community Supports: Patient is a widow, lives at home, his 3 children take turns to care for him. REdd Arbourhis eldest child stays with him during the week from Sunday to Friday. His other children fill in the rest of the days.  I spent 40 minutes providing this consultation, time includes time spent with patient and family, chart review, provider coordination, and documentation. More than 50% of the time in this consultation was spent counseling and coordinating communication.   CHIEF COMPLAINT: Pressure ulcer in buttocks  HISTORY OF PRESENT ILLNESS:  [name] a 87y.o. year old male with multiple medical problems including CKD 4, chronic A-fib, moderate aortic stenosis, hypothyroidism, chronic diastolic heart failure (EF 55-60% in 08/2019), BPH, Type 2 diabetes (A1c 8.5), HTN, PAD s/p bilateral BKA. Patient has recent NSTEMI in October 2021. Palliative Care was asked to follow this patient by consultation request of [name] MD to help address advance care planning and complex decision making.   CODE STATUS: CPR / no intubation  PPS: 40%  HOSPICE ELIGIBILITY/DIAGNOSIS: TBD  PHYSICAL EXAM / ROS:   Vital signs: T 97.7, BP 148/72,  P 60, 98% on room air General: NAD, frail appearing, obese Cardiovascular: denied chest pain,  denied palpitation  Pulmonary: no cough, no SOB, room air GI:  appetite good, denied constipation, continent of bowel GU: denies dysuria, chronic foley cath, urine clear and yellow MSK:  No ROM  abnormality, bilateral BKA Skin: no rashes, stage 2 pressure ulcer to bilateral buttocks Neurological: Weakness, but otherwise nonfocal Psych: non -anxious affect  PAST MEDICAL HISTORY:  Past Medical History:  Diagnosis Date  . Arthritis   . Borderline diabetic   . CHF (congestive heart failure) ([facility])   . Chronic kidney disease   . Diabetes mellitus without complication ([facility])   . Dysrhythmia   . Glaucoma   . Hyperlipidemia   . Hypertension   . Hypothyroidism   . Myocardial infarction ([facility])   . PVD (peripheral vascular disease) ([facility])   . Sleep apnea    Cpap ordered but doesnt use  . Urinary retention due to benign prostatic hyperplasia 12/28/2018    SOCIAL HX:  Social History   Tobacco Use  . Smoking status: Former Smoker    Quit date: 01/13/1965    Years since quitting: 55.1  . Smokeless tobacco: Former Systems developer    Types: Chew  Substance Use Topics  . Alcohol use: No   FAMILY HX:  Family History  Problem Relation Age of Onset  . Cancer Mother   . Heart attack Father   . Stroke Father   . Heart attack Brother   . Heart attack Brother     ALLERGIES:  Allergies  Allergen Reactions  . Iodinated Diagnostic Agents Other (See Comments)    caused Fever  . Dye Fdc Red [Red Dye] Hives    Tolerated red Docusate, Niferex     PERTINENT MEDICATIONS:  Outpatient Encounter Medications as of 02/09/2020  Medication Sig  . acetaminophen (TYLENOL) 500 MG tablet Take 500 mg by mouth every 6 (six) hours as needed for headache (pain).  [name] amLODipine (NORVASC) 10 MG tablet Take 1 tablet (10 mg total) by mouth daily.  [name] apixaban (ELIQUIS) 5 MG TABS tablet Take 2.5 mg by mouth 2 (two) times daily.   . Ascorbic Acid (VITAMIN C) 1000 MG tablet Take 1,000 mg by mouth daily.   [name] atorvastatin (LIPITOR) 40 MG tablet Take 1 tablet (40 mg total) by mouth every evening.  . betamethasone dipropionate 0.05 % cream Apply 1 application topically 2 (two) times daily.  (Patient not taking: Reported on  01/11/2020)  . carvedilol (COREG) 6.25 MG tablet Take 1 tablet (6.25 mg total) by mouth 2 (two) times daily with a meal.  . Cholecalciferol (VITAMIN D-3) 125 MCG (5000 UT) TABS Take 5,000 Units by mouth every evening.  . clopidogrel (PLAVIX) 75 MG tablet Take 1 tablet (75 mg total) by mouth daily with breakfast.  . co-enzyme Q-10 30 MG capsule Take 100 mg by mouth daily.  . dorzolamide-timolol (COSOPT) 22.3-6.8 MG/ML ophthalmic solution Place 1 drop into both eyes 2 (two) times daily. (Patient not taking: Reported on 01/11/2020)  . famotidine (PEPCID) 20 MG tablet Take 1 tablet (20 mg total) by mouth daily.  . ferrous sulfate 325 (65 FE) MG tablet Take 325 mg by mouth 2 (two) times daily with a meal.  . finasteride (PROSCAR) 5 MG tablet Take 1 tablet (5 mg total) by mouth every evening. (Patient not taking: No sig reported)  . furosemide (LASIX) 20 MG tablet Take 1 tablet (20 mg total) by mouth daily.  [name] glipiZIDE (GLUCOTROL) 5 MG tablet Take 5 mg by mouth 2 (two) times daily.   . hydrOXYzine (  ATARAX/VISTARIL) 25 MG tablet Take 25 mg by mouth 4 (four) times daily. (Patient not taking: No sig reported)  . latanoprost (XALATAN) 0.005 % ophthalmic solution Place 1 drop into both eyes at bedtime.  [name] levothyroxine (SYNTHROID) 75 MCG tablet Take 75 mcg by mouth daily before breakfast.   . Misc Natural Products (GLUCOSAMINE CHOND MSM FORMULA PO) Take 1 tablet by mouth 2 (two) times daily. (Patient not taking: Reported on 01/11/2020)  . ONETOUCH ULTRA test strip 1 each 2 (two) times daily.  . polyethylene glycol (MIRALAX) 17 g packet Take 17 g by mouth daily as needed for moderate constipation.  [name] pyridoxine (B-6) 100 MG tablet Take 100 mg by mouth daily.  . tamsulosin (FLOMAX) 0.4 MG CAPS capsule Take 0.4 mg by mouth daily. (Patient not taking: No sig reported)  . traMADol (ULTRAM) 50 MG tablet Take 1 tablet (50 mg total) by mouth every 6 (six) hours as needed. (Patient not taking: Reported on  01/11/2020)  . vitamin E 400 UNIT capsule Take 400 Units by mouth daily.  [name] zinc gluconate 50 MG tablet Take 50 mg by mouth daily.   No facility-administered encounter medications on file as of 02/09/2020.    Thank you for the opportunity to participate in the care of [name]. The palliative care team will continue to follow. Please call our office at [phone] if we can be of additional assistance.  Jari Favre, DNP, AGPCNP-BC

## 2020-02-11 ENCOUNTER — Other Ambulatory Visit: Payer: Self-pay | Admitting: *Deleted

## 2020-02-11 NOTE — Patient Outreach (Signed)
[facility]) Care Management  [place]  02/12/2020   ENNIO HOUP 1927/06/15 [id number]   Outreach attempt #3 to member/family, successful to son.  He report he does not have much time to talk, in the process of packing up to drive home.  He is in the home with member during the week, travels back home to [place] on the weekends and other siblings help with care of member.  State member has been well, blood pressure and blood sugars managed, denies any readings consistently out of range.  Report biggest concern now is sacral wound.  Family and home health nurse are managing doing dressing changes daily, nurse in the home twice a week.  Noted per chart, [facility] remains in the home as well, last visit was 1/26.  Son verbalizes understanding regarding management of blood sugars and wound healing as well as frequent repositioning to decrease pressure.  Denies any urgent concerns, encouraged to contact this care manager with questions.  Encounter Medications:  Outpatient Encounter Medications as of 02/11/2020  Medication Sig Note  . acetaminophen (TYLENOL) 500 MG tablet Take 500 mg by mouth every 6 (six) hours as needed for headache (pain).   [name] amLODipine (NORVASC) 10 MG tablet Take 1 tablet (10 mg total) by mouth daily.   [name] apixaban (ELIQUIS) 5 MG TABS tablet Take 2.5 mg by mouth 2 (two) times daily.    . Ascorbic Acid (VITAMIN C) 1000 MG tablet Take 1,000 mg by mouth daily.    [name] atorvastatin (LIPITOR) 40 MG tablet Take 1 tablet (40 mg total) by mouth every evening.   . betamethasone dipropionate 0.05 % cream Apply 1 application topically 2 (two) times daily.  (Patient not taking: Reported on 01/11/2020)   . carvedilol (COREG) 6.25 MG tablet Take 1 tablet (6.25 mg total) by mouth 2 (two) times daily with a meal.   . Cholecalciferol (VITAMIN D-3) 125 MCG (5000 UT) TABS Take 5,000 Units by mouth every evening.   . clopidogrel (PLAVIX) 75 MG tablet Take 1 tablet (75  mg total) by mouth daily with breakfast. 12/06/2019: Not sure had this today  . co-enzyme Q-10 30 MG capsule Take 100 mg by mouth daily.   . dorzolamide-timolol (COSOPT) 22.3-6.8 MG/ML ophthalmic solution Place 1 drop into both eyes 2 (two) times daily. (Patient not taking: Reported on 01/11/2020)   . famotidine (PEPCID) 20 MG tablet Take 1 tablet (20 mg total) by mouth daily.   . ferrous sulfate 325 (65 FE) MG tablet Take 325 mg by mouth 2 (two) times daily with a meal.   . finasteride (PROSCAR) 5 MG tablet Take 1 tablet (5 mg total) by mouth every evening. (Patient not taking: No sig reported)   . furosemide (LASIX) 20 MG tablet Take 1 tablet (20 mg total) by mouth daily. 01/11/2020: Taking twice a day   . glipiZIDE (GLUCOTROL) 5 MG tablet Take 5 mg by mouth 2 (two) times daily.  01/11/2020: Taking 10 mg twice a day  . hydrOXYzine (ATARAX/VISTARIL) 25 MG tablet Take 25 mg by mouth 4 (four) times daily. (Patient not taking: No sig reported)   . latanoprost (XALATAN) 0.005 % ophthalmic solution Place 1 drop into both eyes at bedtime.   [name] levothyroxine (SYNTHROID) 75 MCG tablet Take 75 mcg by mouth daily before breakfast.  12/06/2019: Unsure if pt is still taking thyroid medication call sister around 4 to see if he is still on this.  . Misc Natural Products ([place] MSM FORMULA  PO) Take 1 tablet by mouth 2 (two) times daily. (Patient not taking: Reported on 01/11/2020)   . ONETOUCH ULTRA test strip 1 each 2 (two) times daily.   . polyethylene glycol (MIRALAX) 17 g packet Take 17 g by mouth daily as needed for moderate constipation.   [name] pyridoxine (B-6) 100 MG tablet Take 100 mg by mouth daily. 12/06/2019: Not sure ask sister  . tamsulosin (FLOMAX) 0.4 MG CAPS capsule Take 0.4 mg by mouth daily. (Patient not taking: No sig reported) 12/06/2019: Ask sister   . traMADol (ULTRAM) 50 MG tablet Take 1 tablet (50 mg total) by mouth every 6 (six) hours as needed. (Patient not taking: Reported on  01/11/2020) 10/04/2019: #60 filled 02/15/2019 [facility] per PMP AWARE  . vitamin E 400 UNIT capsule Take 400 Units by mouth daily.   [name] zinc gluconate 50 MG tablet Take 50 mg by mouth daily.    No facility-administered encounter medications on file as of 02/11/2020.    Functional Status:  In your present state of health, do you have any difficulty performing the following activities: 12/06/2019 10/05/2019  Hearing? Tempie Donning  Vision? N N  Difficulty concentrating or making decisions? Tempie Donning  Walking or climbing stairs? Y Y  Dressing or bathing? Y Y  Doing errands, shopping? N Y  Some recent data might be hidden    Fall/Depression Screening: Fall Risk  07/21/2019 06/02/2019 02/15/2019  Falls in the past year? 1 1 0  Number falls in past yr: 1 1 -  Injury with Fall? 1 1 -  Risk for fall due to : Impaired mobility;Impaired balance/gait Impaired mobility;Impaired balance/gait;History of fall(s) -  Risk for fall due to: Comment Bilateral amputation bilateral BKA -  Follow up Falls prevention discussed;Education provided;Falls evaluation completed Falls prevention discussed;Education provided;Falls evaluation completed -   PHQ 2/9 Scores 10/19/2019 06/02/2019 01/12/2019  PHQ - 2 Score - 1 2  PHQ- 9 Score - - 3  Exception Documentation Other- indicate reason in comment box - -    Assessment:  Goals Addressed            This Visit's Progress   . [facility] - Monitor and Manage My Blood Sugar   On track    Follow Up Date 03/08/2020  Timeframe:  Short-Term Goal Priority:  Medium Start Date:          02/11/2019                   Expected End Date:   03/13/2020                      - check blood sugar at prescribed times - check blood sugar if I feel it is too high or too low - take the blood sugar log to all doctor visits    Why is this important?   Checking your blood sugar at home helps to keep it from getting very high or very low.  Writing the results in a diary or log helps the doctor know how to  care for you.  Your blood sugar log should have the time, date and the results.  Also, write down the amount of insulin or other medicine that you take.  Other information, like what you ate, exercise done and how you were feeling, will also be helpful.     Notes:   11/18 - blood sugar readings reviewed, discussed compliance with medication regime  12/28 - Discussed proper diabetic and  low sodium diet  02/11/2020 - Remind of importance of managing blood sugars in correlation to wound healing    . [facility] - Set My Target A1C   On track    Follow Up Date 03/08/2020  Timeframe:  Long-Range Goal Priority:  Medium Start Date:       01/11/2020                      Expected End Date:   03/13/2020                      - set target A1C (7)    Why is this important?   Your target A1C is decided together by you and your doctor.  It is based on several things like your age and other health issues.    Notes:   11/18 - Discussed dose change for oral diabetic agents  12/28 - Call placed to PCP office requesting order for new A1C lab  1/28 - Re-educated on DM management in effort to decrease A1C    . [facility] -Improve My Heart Health   On track    Follow Up Date 03/08/2020  Timeframe:  Short-Term Goal Priority:  High Start Date:    02/11/2020                         Expected End Date:       03/13/2020                  - be open to making changes - if I have chest pain, call for help    Why is this important?   Lifestyle changes are key to improving the blood flow to your heart. Think about the things you can change and set a goal to live healthy.  Remember, when the blood vessels to your heart start to get clogged you may not have any symptoms.  Over time, they can get worse.  Don't ignore the signs, like chest pain, and get help right away.     Notes:   11/18 - discussed antihypertensive dose change and BP trends  12/28 - Reviewed BP trends since medication changes, encouraged to continue  to monitor  1/28 - Re-educated son on proper diet to promote heart health       Plan:  Follow-up:  Patient agrees to Care Plan and Follow-up.  Will send education regarding pressure sores and reposition.  Will follow up within the next month.  [name], [place], MSN [place] [phone]

## 2020-02-12 DIAGNOSIS — I129 Hypertensive chronic kidney disease with stage 1 through stage 4 chronic kidney disease, or unspecified chronic kidney disease: Secondary | ICD-10-CM | POA: Diagnosis not present

## 2020-02-12 DIAGNOSIS — E1122 Type 2 diabetes mellitus with diabetic chronic kidney disease: Secondary | ICD-10-CM | POA: Diagnosis not present

## 2020-02-12 DIAGNOSIS — N183 Chronic kidney disease, stage 3 unspecified: Secondary | ICD-10-CM | POA: Diagnosis not present

## 2020-02-12 DIAGNOSIS — I4891 Unspecified atrial fibrillation: Secondary | ICD-10-CM | POA: Diagnosis not present

## 2020-02-12 NOTE — Patient Instructions (Signed)
Pressure Injury  A pressure injury is damage to the skin and underlying tissue that results from pressure being applied to an area of the body. It often affects people who must spend a long time in a bed or chair because of a medical condition. Pressure injuries usually occur:  Over bony parts of the body, such as the tailbone, shoulders, elbows, hips, heels, spine, ankles, and back of the head.  Under medical devices that make contact with the body, such as respiratory equipment, stockings, tubes, and splints. Pressure injuries start as reddened areas on the skin and can lead to pain and an open wound. What are the causes? This condition is caused by frequent or constant pressure to an area of the body. Decreased blood flow to the skin can eventually cause the skin tissue to die and break down, causing a wound. What increases the risk? You are more likely to develop this condition if you:  Are in the hospital or an extended care facility.  Are bedridden or in a wheelchair.  Have an injury or disease that keeps you from: ? Moving normally. ? Feeling pain or pressure.  Have a condition that: ? Makes you sleepy or less alert. ? Causes poor blood flow.  Need to wear a medical device.  Have poor control of your bladder or bowel functions (incontinence).  Have poor nutrition (malnutrition). If you are at risk for pressure injuries, your health care provider may recommend certain types of mattresses, mattress covers, pillows, cushions, or boots to help prevent them. These may include products filled with air, foam, gel, or sand. What are the signs or symptoms? Symptoms of this condition depend on the severity of the injury. Symptoms may include:  Red or dark areas of the skin.  Pain, warmth, or a change of skin texture.  Blisters.  An open wound. How is this diagnosed? This condition is diagnosed with a medical history and physical exam. You may also have tests, such as:  Blood  tests.  Imaging tests.  Blood flow tests. Your pressure injury will be staged based on its severity. Staging is based on:  The depth of the tissue injury, including whether there is exposure of muscle, bone, or tendon.  The cause of the pressure injury. How is this treated? This condition may be treated by:  Relieving or redistributing pressure on your skin. This includes: ? Frequently changing your position. ? Avoiding positions that caused the wound or that can make the wound worse. ? Using specific bed mattresses, chair cushions, or protective boots. ? Moving medical devices from an area of pressure, or placing padding between the skin and the device. ? Using foams, creams, or powders to prevent rubbing (friction) on the skin.  Keeping your skin clean and dry. This may include using a skin cleanser or skin barrier as told by your health care provider.  Cleaning your injury and removing any dead tissue from the wound (debridement).  Placing a bandage (dressing) over your injury.  Using medicines for pain or to prevent or treat infection. Surgery may be needed if other treatments are not working or if your injury is very deep. Follow these instructions at home: Wound care  Follow instructions from your health care provider about how to take care of your wound. Make sure you: ? Wash your hands with soap and water before and after you change your bandage (dressing). If soap and water are not available, use hand sanitizer. ? Change your dressing as told   by your health care provider.  Check your wound every day for signs of infection. Have a caregiver do this for you if you are not able. Check for: ? Redness, swelling, or increased pain. ? More fluid or blood. ? Warmth. ? Pus or a bad smell. Skin care  Keep your skin clean and dry. Gently pat your skin dry.  Do not rub or massage your skin.  You or a caregiver should check your skin every day for any changes in color or  any new blisters or sores (ulcers). Medicines  Take over-the-counter and prescription medicines only as told by your health care provider.  If you were prescribed an antibiotic medicine, take or apply it as told by your health care provider. Do not stop using the antibiotic even if your condition improves. Reducing and redistributing pressure  Do not lie or sit in one position for a long time. Move or change position every 1-2 hours, or as told by your health care provider.  Use pillows or cushions to reduce pressure. Ask your health care provider to recommend cushions or pads for you. General instructions  Eat a healthy diet that includes lots of protein.  Drink enough fluid to keep your urine pale yellow.  Be as active as you can every day. Ask your health care provider to suggest safe exercises or activities.  Do not abuse drugs or alcohol.  Do not use any products that contain nicotine or tobacco, such as cigarettes, e-cigarettes, and chewing tobacco. If you need help quitting, ask your health care provider.  Keep all follow-up visits as told by your health care provider. This is important.   Contact a health care provider if:  You have: ? A fever or chills. ? Pain that is not helped by medicine. ? Any changes in skin color. ? New blisters or sores. ? Pus or a bad smell coming from your wound. ? Redness, swelling, or pain around your wound. ? More fluid or blood coming from your wound.  Your wound does not improve after 1-2 weeks of treatment. Summary  A pressure injury is damage to the skin and underlying tissue that results from pressure being applied to an area of the body.  Do not lie or sit in one position for a long time. Your health care provider may advise you to move or change position every 1-2 hours.  Follow instructions from your health care provider about how to take care of your wound.  Keep all follow-up visits as told by your health care provider. This  is important. This information is not intended to replace advice given to you by your health care provider. Make sure you discuss any questions you have with your health care provider. Document Revised: 07/30/2017 Document Reviewed: 07/30/2017 Elsevier Patient Education  [place]            This Visit's Progress   . [facility] - Monitor and Manage My Blood Sugar   On track    Follow Up Date 03/08/2020  Timeframe:  Short-Term Goal Priority:  Medium Start Date:          02/11/2019                   Expected End Date:   03/13/2020                      - check blood sugar at prescribed times -  check blood sugar if I feel it is too high or too low - take the blood sugar log to all doctor visits    Why is this important?   Checking your blood sugar at home helps to keep it from getting very high or very low.  Writing the results in a diary or log helps the doctor know how to care for you.  Your blood sugar log should have the time, date and the results.  Also, write down the amount of insulin or other medicine that you take.  Other information, like what you ate, exercise done and how you were feeling, will also be helpful.     Notes:   11/18 - blood sugar readings reviewed, discussed compliance with medication regime  12/28 - Discussed proper diabetic and low sodium diet  02/11/2020 - Remind of importance of managing blood sugars in correlation to wound healing    . [facility] - Set My Target A1C   On track    Follow Up Date 03/08/2020  Timeframe:  Long-Range Goal Priority:  Medium Start Date:       01/11/2020                      Expected End Date:   03/13/2020                      - set target A1C (7)    Why is this important?   Your target A1C is decided together by you and your doctor.  It is based on several things like your age and other health issues.    Notes:   11/18 - Discussed dose change for oral diabetic agents  12/28 - Call placed  to PCP office requesting order for new A1C lab  1/28 - Re-educated on DM management in effort to decrease A1C    . [facility] -Improve My Heart Health   On track    Follow Up Date 03/08/2020  Timeframe:  Short-Term Goal Priority:  High Start Date:    02/11/2020                         Expected End Date:       03/13/2020                  - be open to making changes - if I have chest pain, call for help    Why is this important?   Lifestyle changes are key to improving the blood flow to your heart. Think about the things you can change and set a goal to live healthy.  Remember, when the blood vessels to your heart start to get clogged you may not have any symptoms.  Over time, they can get worse.  Don't ignore the signs, like chest pain, and get help right away.     Notes:   11/18 - discussed antihypertensive dose change and BP trends  12/28 - Reviewed BP trends since medication changes, encouraged to continue to monitor  1/28 - Re-educated son on proper diet to promote heart health

## 2020-02-15 ENCOUNTER — Encounter: Payer: Medicare Other | Admitting: Physical Therapy

## 2020-02-15 DIAGNOSIS — E43 Unspecified severe protein-calorie malnutrition: Secondary | ICD-10-CM | POA: Diagnosis not present

## 2020-02-15 DIAGNOSIS — E1165 Type 2 diabetes mellitus with hyperglycemia: Secondary | ICD-10-CM | POA: Diagnosis not present

## 2020-02-15 DIAGNOSIS — D631 Anemia in chronic kidney disease: Secondary | ICD-10-CM | POA: Diagnosis not present

## 2020-02-15 DIAGNOSIS — I130 Hypertensive heart and chronic kidney disease with heart failure and stage 1 through stage 4 chronic kidney disease, or unspecified chronic kidney disease: Secondary | ICD-10-CM | POA: Diagnosis not present

## 2020-02-15 DIAGNOSIS — I480 Paroxysmal atrial fibrillation: Secondary | ICD-10-CM | POA: Diagnosis not present

## 2020-02-15 DIAGNOSIS — Z466 Encounter for fitting and adjustment of urinary device: Secondary | ICD-10-CM | POA: Diagnosis not present

## 2020-02-15 DIAGNOSIS — E1122 Type 2 diabetes mellitus with diabetic chronic kidney disease: Secondary | ICD-10-CM | POA: Diagnosis not present

## 2020-02-15 DIAGNOSIS — I5032 Chronic diastolic (congestive) heart failure: Secondary | ICD-10-CM | POA: Diagnosis not present

## 2020-02-15 DIAGNOSIS — I2510 Atherosclerotic heart disease of native coronary artery without angina pectoris: Secondary | ICD-10-CM | POA: Diagnosis not present

## 2020-02-15 DIAGNOSIS — L89152 Pressure ulcer of sacral region, stage 2: Secondary | ICD-10-CM | POA: Diagnosis not present

## 2020-02-15 DIAGNOSIS — E871 Hypo-osmolality and hyponatremia: Secondary | ICD-10-CM | POA: Diagnosis not present

## 2020-02-15 DIAGNOSIS — I252 Old myocardial infarction: Secondary | ICD-10-CM | POA: Diagnosis not present

## 2020-02-15 DIAGNOSIS — N184 Chronic kidney disease, stage 4 (severe): Secondary | ICD-10-CM | POA: Diagnosis not present

## 2020-02-18 DIAGNOSIS — I480 Paroxysmal atrial fibrillation: Secondary | ICD-10-CM | POA: Diagnosis not present

## 2020-02-18 DIAGNOSIS — E43 Unspecified severe protein-calorie malnutrition: Secondary | ICD-10-CM | POA: Diagnosis not present

## 2020-02-18 DIAGNOSIS — Z466 Encounter for fitting and adjustment of urinary device: Secondary | ICD-10-CM | POA: Diagnosis not present

## 2020-02-18 DIAGNOSIS — D631 Anemia in chronic kidney disease: Secondary | ICD-10-CM | POA: Diagnosis not present

## 2020-02-18 DIAGNOSIS — I130 Hypertensive heart and chronic kidney disease with heart failure and stage 1 through stage 4 chronic kidney disease, or unspecified chronic kidney disease: Secondary | ICD-10-CM | POA: Diagnosis not present

## 2020-02-18 DIAGNOSIS — I252 Old myocardial infarction: Secondary | ICD-10-CM | POA: Diagnosis not present

## 2020-02-18 DIAGNOSIS — E871 Hypo-osmolality and hyponatremia: Secondary | ICD-10-CM | POA: Diagnosis not present

## 2020-02-18 DIAGNOSIS — I2510 Atherosclerotic heart disease of native coronary artery without angina pectoris: Secondary | ICD-10-CM | POA: Diagnosis not present

## 2020-02-18 DIAGNOSIS — I5032 Chronic diastolic (congestive) heart failure: Secondary | ICD-10-CM | POA: Diagnosis not present

## 2020-02-18 DIAGNOSIS — N184 Chronic kidney disease, stage 4 (severe): Secondary | ICD-10-CM | POA: Diagnosis not present

## 2020-02-18 DIAGNOSIS — E1165 Type 2 diabetes mellitus with hyperglycemia: Secondary | ICD-10-CM | POA: Diagnosis not present

## 2020-02-18 DIAGNOSIS — E1122 Type 2 diabetes mellitus with diabetic chronic kidney disease: Secondary | ICD-10-CM | POA: Diagnosis not present

## 2020-02-18 DIAGNOSIS — L89152 Pressure ulcer of sacral region, stage 2: Secondary | ICD-10-CM | POA: Diagnosis not present

## 2020-02-21 DIAGNOSIS — E1165 Type 2 diabetes mellitus with hyperglycemia: Secondary | ICD-10-CM | POA: Diagnosis not present

## 2020-02-21 DIAGNOSIS — N184 Chronic kidney disease, stage 4 (severe): Secondary | ICD-10-CM | POA: Diagnosis not present

## 2020-02-21 DIAGNOSIS — E039 Hypothyroidism, unspecified: Secondary | ICD-10-CM | POA: Diagnosis not present

## 2020-02-21 DIAGNOSIS — L8992 Pressure ulcer of unspecified site, stage 2: Secondary | ICD-10-CM | POA: Diagnosis not present

## 2020-02-21 DIAGNOSIS — R2689 Other abnormalities of gait and mobility: Secondary | ICD-10-CM | POA: Diagnosis not present

## 2020-02-21 DIAGNOSIS — I700 Atherosclerosis of aorta: Secondary | ICD-10-CM | POA: Diagnosis not present

## 2020-02-21 DIAGNOSIS — E782 Mixed hyperlipidemia: Secondary | ICD-10-CM | POA: Diagnosis not present

## 2020-02-21 DIAGNOSIS — Z0001 Encounter for general adult medical examination with abnormal findings: Secondary | ICD-10-CM | POA: Diagnosis not present

## 2020-02-21 DIAGNOSIS — E7849 Other hyperlipidemia: Secondary | ICD-10-CM | POA: Diagnosis not present

## 2020-02-21 DIAGNOSIS — Z1389 Encounter for screening for other disorder: Secondary | ICD-10-CM | POA: Diagnosis not present

## 2020-02-21 DIAGNOSIS — Z681 Body mass index (BMI) 19 or less, adult: Secondary | ICD-10-CM | POA: Diagnosis not present

## 2020-02-21 DIAGNOSIS — I10 Essential (primary) hypertension: Secondary | ICD-10-CM | POA: Diagnosis not present

## 2020-02-22 DIAGNOSIS — Z466 Encounter for fitting and adjustment of urinary device: Secondary | ICD-10-CM | POA: Diagnosis not present

## 2020-02-22 DIAGNOSIS — D631 Anemia in chronic kidney disease: Secondary | ICD-10-CM | POA: Diagnosis not present

## 2020-02-22 DIAGNOSIS — E1165 Type 2 diabetes mellitus with hyperglycemia: Secondary | ICD-10-CM | POA: Diagnosis not present

## 2020-02-22 DIAGNOSIS — E1122 Type 2 diabetes mellitus with diabetic chronic kidney disease: Secondary | ICD-10-CM | POA: Diagnosis not present

## 2020-02-22 DIAGNOSIS — N184 Chronic kidney disease, stage 4 (severe): Secondary | ICD-10-CM | POA: Diagnosis not present

## 2020-02-22 DIAGNOSIS — I480 Paroxysmal atrial fibrillation: Secondary | ICD-10-CM | POA: Diagnosis not present

## 2020-02-22 DIAGNOSIS — E871 Hypo-osmolality and hyponatremia: Secondary | ICD-10-CM | POA: Diagnosis not present

## 2020-02-22 DIAGNOSIS — E43 Unspecified severe protein-calorie malnutrition: Secondary | ICD-10-CM | POA: Diagnosis not present

## 2020-02-22 DIAGNOSIS — I130 Hypertensive heart and chronic kidney disease with heart failure and stage 1 through stage 4 chronic kidney disease, or unspecified chronic kidney disease: Secondary | ICD-10-CM | POA: Diagnosis not present

## 2020-02-22 DIAGNOSIS — I252 Old myocardial infarction: Secondary | ICD-10-CM | POA: Diagnosis not present

## 2020-02-22 DIAGNOSIS — I2510 Atherosclerotic heart disease of native coronary artery without angina pectoris: Secondary | ICD-10-CM | POA: Diagnosis not present

## 2020-02-22 DIAGNOSIS — L89152 Pressure ulcer of sacral region, stage 2: Secondary | ICD-10-CM | POA: Diagnosis not present

## 2020-02-22 DIAGNOSIS — I5032 Chronic diastolic (congestive) heart failure: Secondary | ICD-10-CM | POA: Diagnosis not present

## 2020-02-24 ENCOUNTER — Emergency Department (HOSPITAL_COMMUNITY): Payer: Medicare Other

## 2020-02-24 ENCOUNTER — Other Ambulatory Visit: Payer: Self-pay

## 2020-02-24 ENCOUNTER — Inpatient Hospital Stay (HOSPITAL_COMMUNITY)
Admission: EM | Admit: 2020-02-24 | Discharge: 2020-02-27 | DRG: 698 | Disposition: A | Discharge status: Home Health | Payer: Medicare Other | Attending: Internal Medicine | Admitting: Internal Medicine

## 2020-02-24 ENCOUNTER — Encounter (HOSPITAL_COMMUNITY): Payer: Self-pay | Admitting: *Deleted

## 2020-02-24 DIAGNOSIS — I10 Essential (primary) hypertension: Secondary | ICD-10-CM | POA: Diagnosis not present

## 2020-02-24 DIAGNOSIS — R338 Other retention of urine: Secondary | ICD-10-CM

## 2020-02-24 DIAGNOSIS — Z7189 Other specified counseling: Secondary | ICD-10-CM | POA: Diagnosis not present

## 2020-02-24 DIAGNOSIS — Z978 Presence of other specified devices: Secondary | ICD-10-CM | POA: Diagnosis not present

## 2020-02-24 DIAGNOSIS — N184 Chronic kidney disease, stage 4 (severe): Secondary | ICD-10-CM | POA: Diagnosis not present

## 2020-02-24 DIAGNOSIS — Z91041 Radiographic dye allergy status: Secondary | ICD-10-CM

## 2020-02-24 DIAGNOSIS — I6782 Cerebral ischemia: Secondary | ICD-10-CM | POA: Diagnosis not present

## 2020-02-24 DIAGNOSIS — J9811 Atelectasis: Secondary | ICD-10-CM | POA: Diagnosis present

## 2020-02-24 DIAGNOSIS — E860 Dehydration: Secondary | ICD-10-CM | POA: Diagnosis not present

## 2020-02-24 DIAGNOSIS — G9389 Other specified disorders of brain: Secondary | ICD-10-CM | POA: Diagnosis not present

## 2020-02-24 DIAGNOSIS — B965 Pseudomonas (aeruginosa) (mallei) (pseudomallei) as the cause of diseases classified elsewhere: Secondary | ICD-10-CM | POA: Diagnosis present

## 2020-02-24 DIAGNOSIS — J90 Pleural effusion, not elsewhere classified: Secondary | ICD-10-CM | POA: Diagnosis not present

## 2020-02-24 DIAGNOSIS — I482 Chronic atrial fibrillation, unspecified: Secondary | ICD-10-CM | POA: Diagnosis not present

## 2020-02-24 DIAGNOSIS — E1165 Type 2 diabetes mellitus with hyperglycemia: Secondary | ICD-10-CM

## 2020-02-24 DIAGNOSIS — Z20822 Contact with and (suspected) exposure to covid-19: Secondary | ICD-10-CM | POA: Diagnosis present

## 2020-02-24 DIAGNOSIS — E1151 Type 2 diabetes mellitus with diabetic peripheral angiopathy without gangrene: Secondary | ICD-10-CM | POA: Diagnosis present

## 2020-02-24 DIAGNOSIS — R4182 Altered mental status, unspecified: Secondary | ICD-10-CM | POA: Diagnosis present

## 2020-02-24 DIAGNOSIS — D631 Anemia in chronic kidney disease: Secondary | ICD-10-CM | POA: Diagnosis not present

## 2020-02-24 DIAGNOSIS — Z466 Encounter for fitting and adjustment of urinary device: Secondary | ICD-10-CM | POA: Diagnosis not present

## 2020-02-24 DIAGNOSIS — I130 Hypertensive heart and chronic kidney disease with heart failure and stage 1 through stage 4 chronic kidney disease, or unspecified chronic kidney disease: Secondary | ICD-10-CM | POA: Diagnosis not present

## 2020-02-24 DIAGNOSIS — T83518A Infection and inflammatory reaction due to other urinary catheter, initial encounter: Secondary | ICD-10-CM | POA: Diagnosis not present

## 2020-02-24 DIAGNOSIS — Z95820 Peripheral vascular angioplasty status with implants and grafts: Secondary | ICD-10-CM

## 2020-02-24 DIAGNOSIS — I350 Nonrheumatic aortic (valve) stenosis: Secondary | ICD-10-CM | POA: Diagnosis present

## 2020-02-24 DIAGNOSIS — I2510 Atherosclerotic heart disease of native coronary artery without angina pectoris: Secondary | ICD-10-CM | POA: Diagnosis not present

## 2020-02-24 DIAGNOSIS — E785 Hyperlipidemia, unspecified: Secondary | ICD-10-CM | POA: Diagnosis not present

## 2020-02-24 DIAGNOSIS — N401 Enlarged prostate with lower urinary tract symptoms: Secondary | ICD-10-CM | POA: Diagnosis present

## 2020-02-24 DIAGNOSIS — E876 Hypokalemia: Secondary | ICD-10-CM | POA: Diagnosis present

## 2020-02-24 DIAGNOSIS — I5032 Chronic diastolic (congestive) heart failure: Secondary | ICD-10-CM | POA: Diagnosis not present

## 2020-02-24 DIAGNOSIS — Z89512 Acquired absence of left leg below knee: Secondary | ICD-10-CM

## 2020-02-24 DIAGNOSIS — E1159 Type 2 diabetes mellitus with other circulatory complications: Secondary | ICD-10-CM | POA: Diagnosis present

## 2020-02-24 DIAGNOSIS — H409 Unspecified glaucoma: Secondary | ICD-10-CM | POA: Diagnosis not present

## 2020-02-24 DIAGNOSIS — Y846 Urinary catheterization as the cause of abnormal reaction of the patient, or of later complication, without mention of misadventure at the time of the procedure: Secondary | ICD-10-CM | POA: Diagnosis present

## 2020-02-24 DIAGNOSIS — N390 Urinary tract infection, site not specified: Secondary | ICD-10-CM

## 2020-02-24 DIAGNOSIS — J3489 Other specified disorders of nose and nasal sinuses: Secondary | ICD-10-CM | POA: Diagnosis not present

## 2020-02-24 DIAGNOSIS — Z87891 Personal history of nicotine dependence: Secondary | ICD-10-CM

## 2020-02-24 DIAGNOSIS — L89152 Pressure ulcer of sacral region, stage 2: Secondary | ICD-10-CM | POA: Diagnosis not present

## 2020-02-24 DIAGNOSIS — G9341 Metabolic encephalopathy: Secondary | ICD-10-CM | POA: Diagnosis present

## 2020-02-24 DIAGNOSIS — Z7902 Long term (current) use of antithrombotics/antiplatelets: Secondary | ICD-10-CM

## 2020-02-24 DIAGNOSIS — I252 Old myocardial infarction: Secondary | ICD-10-CM | POA: Diagnosis not present

## 2020-02-24 DIAGNOSIS — E1122 Type 2 diabetes mellitus with diabetic chronic kidney disease: Secondary | ICD-10-CM | POA: Diagnosis not present

## 2020-02-24 DIAGNOSIS — I480 Paroxysmal atrial fibrillation: Secondary | ICD-10-CM | POA: Diagnosis not present

## 2020-02-24 DIAGNOSIS — Z823 Family history of stroke: Secondary | ICD-10-CM

## 2020-02-24 DIAGNOSIS — G4730 Sleep apnea, unspecified: Secondary | ICD-10-CM | POA: Diagnosis present

## 2020-02-24 DIAGNOSIS — Z79890 Hormone replacement therapy (postmenopausal): Secondary | ICD-10-CM

## 2020-02-24 DIAGNOSIS — I70203 Unspecified atherosclerosis of native arteries of extremities, bilateral legs: Secondary | ICD-10-CM | POA: Diagnosis present

## 2020-02-24 DIAGNOSIS — K219 Gastro-esophageal reflux disease without esophagitis: Secondary | ICD-10-CM | POA: Diagnosis present

## 2020-02-24 DIAGNOSIS — Z8673 Personal history of transient ischemic attack (TIA), and cerebral infarction without residual deficits: Secondary | ICD-10-CM

## 2020-02-24 DIAGNOSIS — E43 Unspecified severe protein-calorie malnutrition: Secondary | ICD-10-CM | POA: Diagnosis not present

## 2020-02-24 DIAGNOSIS — Z515 Encounter for palliative care: Secondary | ICD-10-CM | POA: Diagnosis not present

## 2020-02-24 DIAGNOSIS — E039 Hypothyroidism, unspecified: Secondary | ICD-10-CM | POA: Diagnosis not present

## 2020-02-24 DIAGNOSIS — N179 Acute kidney failure, unspecified: Secondary | ICD-10-CM | POA: Diagnosis not present

## 2020-02-24 DIAGNOSIS — Z79899 Other long term (current) drug therapy: Secondary | ICD-10-CM

## 2020-02-24 DIAGNOSIS — Z7901 Long term (current) use of anticoagulants: Secondary | ICD-10-CM

## 2020-02-24 DIAGNOSIS — R404 Transient alteration of awareness: Secondary | ICD-10-CM | POA: Diagnosis not present

## 2020-02-24 DIAGNOSIS — K5900 Constipation, unspecified: Secondary | ICD-10-CM | POA: Diagnosis present

## 2020-02-24 DIAGNOSIS — E871 Hypo-osmolality and hyponatremia: Secondary | ICD-10-CM | POA: Diagnosis not present

## 2020-02-24 DIAGNOSIS — Z89511 Acquired absence of right leg below knee: Secondary | ICD-10-CM

## 2020-02-24 DIAGNOSIS — I4510 Unspecified right bundle-branch block: Secondary | ICD-10-CM | POA: Diagnosis present

## 2020-02-24 DIAGNOSIS — Z8249 Family history of ischemic heart disease and other diseases of the circulatory system: Secondary | ICD-10-CM

## 2020-02-24 DIAGNOSIS — Z7984 Long term (current) use of oral hypoglycemic drugs: Secondary | ICD-10-CM

## 2020-02-24 LAB — CBC WITH DIFFERENTIAL/PLATELET
Abs Immature Granulocytes: 0.02 10*3/uL (ref 0.00–0.07)
Basophils Absolute: 0.1 10*3/uL (ref 0.0–0.1)
Basophils Relative: 1 %
Eosinophils Absolute: 0.7 10*3/uL — ABNORMAL HIGH (ref 0.0–0.5)
Eosinophils Relative: 7 %
HCT: 40.9 % (ref 39.0–52.0)
Hemoglobin: 13.5 g/dL (ref 13.0–17.0)
Immature Granulocytes: 0 %
Lymphocytes Relative: 24 %
Lymphs Abs: 2.3 10*3/uL (ref 0.7–4.0)
MCH: 32.3 pg (ref 26.0–34.0)
MCHC: 33 g/dL (ref 30.0–36.0)
MCV: 97.8 fL (ref 80.0–100.0)
Monocytes Absolute: 0.8 10*3/uL (ref 0.1–1.0)
Monocytes Relative: 8 %
Neutro Abs: 5.9 10*3/uL (ref 1.7–7.7)
Neutrophils Relative %: 60 %
Platelets: 242 10*3/uL (ref 150–400)
RBC: 4.18 MIL/uL — ABNORMAL LOW (ref 4.22–5.81)
RDW: 13.6 % (ref 11.5–15.5)
WBC: 9.7 10*3/uL (ref 4.0–10.5)
nRBC: 0 % (ref 0.0–0.2)

## 2020-02-24 LAB — LACTIC ACID, PLASMA
Lactic Acid, Venous: 1.6 mmol/L (ref 0.5–1.9)
Lactic Acid, Venous: 1 mmol/L (ref 0.5–1.9)

## 2020-02-24 LAB — COMPREHENSIVE METABOLIC PANEL WITH GFR
ALT: 21 U/L (ref 0–44)
AST: 25 U/L (ref 15–41)
Albumin: 3.7 g/dL (ref 3.5–5.0)
Alkaline Phosphatase: 46 U/L (ref 38–126)
Anion gap: 12 (ref 5–15)
BUN: 35 mg/dL — ABNORMAL HIGH (ref 8–23)
CO2: 26 mmol/L (ref 22–32)
Calcium: 9.4 mg/dL (ref 8.9–10.3)
Chloride: 97 mmol/L — ABNORMAL LOW (ref 98–111)
Creatinine, Ser: 2.53 mg/dL — ABNORMAL HIGH (ref 0.61–1.24)
GFR, Estimated: 23 mL/min — ABNORMAL LOW
Glucose, Bld: 224 mg/dL — ABNORMAL HIGH (ref 70–99)
Potassium: 3.7 mmol/L (ref 3.5–5.1)
Sodium: 135 mmol/L (ref 135–145)
Total Bilirubin: 1 mg/dL (ref 0.3–1.2)
Total Protein: 7.7 g/dL (ref 6.5–8.1)

## 2020-02-24 LAB — URINALYSIS, ROUTINE W REFLEX MICROSCOPIC
Bilirubin Urine: NEGATIVE
Glucose, UA: NEGATIVE mg/dL
Ketones, ur: NEGATIVE mg/dL
Nitrite: NEGATIVE
Protein, ur: 100 mg/dL — AB
Specific Gravity, Urine: 1.01 (ref 1.005–1.030)
WBC, UA: >50 WBC/hpf — ABNORMAL HIGH (ref 0–5)
pH: 7 (ref 5.0–8.0)

## 2020-02-24 LAB — GLUCOSE, CAPILLARY: Glucose-Capillary: 171 mg/dL — ABNORMAL HIGH (ref 70–99)

## 2020-02-24 LAB — TSH: TSH: 2.659 u[IU]/mL (ref 0.350–4.500)

## 2020-02-24 LAB — TROPONIN I (HIGH SENSITIVITY)
Troponin I (High Sensitivity): 24 ng/L — ABNORMAL HIGH (ref <18)
Troponin I (High Sensitivity): 26 ng/L — ABNORMAL HIGH (ref <18)

## 2020-02-24 MED ORDER — FAMOTIDINE 20 MG PO TABS
20.0000 mg | ORAL_TABLET | Freq: Every day | ORAL | Status: DC
  Administered 2020-02-25 – 2020-02-27 (×3): 20 mg via ORAL
  Filled 2020-02-24 (×3): qty 1

## 2020-02-24 MED ORDER — SODIUM CHLORIDE 0.9 % IV SOLN
1.0000 g | Freq: Once | INTRAVENOUS | Status: AC
  Administered 2020-02-24: 1 g via INTRAVENOUS
  Filled 2020-02-24: qty 10

## 2020-02-24 MED ORDER — SODIUM CHLORIDE 0.9 % IV BOLUS
500.0000 mL | Freq: Once | INTRAVENOUS | Status: AC
  Administered 2020-02-24: 500 mL via INTRAVENOUS

## 2020-02-24 MED ORDER — APIXABAN 2.5 MG PO TABS
2.5000 mg | ORAL_TABLET | Freq: Every day | ORAL | Status: DC
  Administered 2020-02-25: 2.5 mg via ORAL
  Filled 2020-02-24: qty 1

## 2020-02-24 MED ORDER — POLYETHYLENE GLYCOL 3350 17 G PO PACK: 17.0000 g | PACK | Freq: Every day | ORAL | Status: DC | PRN

## 2020-02-24 MED ORDER — ATORVASTATIN CALCIUM 40 MG PO TABS
40.0000 mg | ORAL_TABLET | Freq: Every evening | ORAL | Status: DC
  Administered 2020-02-25 – 2020-02-26 (×3): 40 mg via ORAL
  Filled 2020-02-24 (×3): qty 1

## 2020-02-24 MED ORDER — ACETAMINOPHEN 325 MG PO TABS
650.0000 mg | ORAL_TABLET | Freq: Four times a day (QID) | ORAL | Status: DC | PRN
  Filled 2020-02-24: qty 2

## 2020-02-24 MED ORDER — SODIUM CHLORIDE 0.9 % IV SOLN
2.0000 g | INTRAVENOUS | Status: DC
  Administered 2020-02-25 – 2020-02-26 (×3): 2 g via INTRAVENOUS
  Filled 2020-02-24 (×4): qty 2

## 2020-02-24 MED ORDER — ONDANSETRON HCL 4 MG/2ML IJ SOLN: 4.0000 mg | Freq: Four times a day (QID) | INTRAMUSCULAR | Status: DC | PRN

## 2020-02-24 MED ORDER — INSULIN ASPART 100 UNIT/ML ~~LOC~~ SOLN
0.0000 [IU] | Freq: Three times a day (TID) | SUBCUTANEOUS | Status: DC
  Administered 2020-02-25: 3 [IU] via SUBCUTANEOUS
  Administered 2020-02-25 – 2020-02-26 (×2): 2 [IU] via SUBCUTANEOUS
  Administered 2020-02-26: 1 [IU] via SUBCUTANEOUS
  Administered 2020-02-26: 2 [IU] via SUBCUTANEOUS
  Administered 2020-02-27: 1 [IU] via SUBCUTANEOUS

## 2020-02-24 MED ORDER — CARVEDILOL 3.125 MG PO TABS
6.2500 mg | ORAL_TABLET | Freq: Two times a day (BID) | ORAL | Status: DC
  Administered 2020-02-25 – 2020-02-27 (×5): 6.25 mg via ORAL
  Filled 2020-02-24 (×6): qty 2

## 2020-02-24 MED ORDER — ACETAMINOPHEN 650 MG RE SUPP: 650.0000 mg | Freq: Four times a day (QID) | RECTAL | Status: DC | PRN

## 2020-02-24 MED ORDER — CLOPIDOGREL BISULFATE 75 MG PO TABS
75.0000 mg | ORAL_TABLET | Freq: Every day | ORAL | Status: DC
Start: 2020-02-25 — End: 2020-02-27
  Administered 2020-02-25 – 2020-02-27 (×3): 75 mg via ORAL
  Filled 2020-02-24 (×3): qty 1

## 2020-02-24 MED ORDER — ONDANSETRON HCL 4 MG PO TABS: 4.0000 mg | ORAL_TABLET | Freq: Four times a day (QID) | ORAL | Status: DC | PRN

## 2020-02-24 MED ORDER — POTASSIUM CHLORIDE CRYS ER 20 MEQ PO TBCR
20.0000 meq | EXTENDED_RELEASE_TABLET | Freq: Two times a day (BID) | ORAL | Status: DC
  Administered 2020-02-25: 20 meq via ORAL
  Filled 2020-02-24: qty 1

## 2020-02-24 MED ORDER — INSULIN ASPART 100 UNIT/ML ~~LOC~~ SOLN: 0.0000 [IU] | Freq: Every day | SUBCUTANEOUS | Status: DC

## 2020-02-24 MED ORDER — GLIPIZIDE 5 MG PO TABS
5.0000 mg | ORAL_TABLET | Freq: Two times a day (BID) | ORAL | Status: DC
Start: 2020-02-25 — End: 2020-02-25

## 2020-02-24 NOTE — H&P (Signed)
History and Physical    [name] BTD:[id number] DOB: 1927/08/26 DOA: 02/24/2020  PCP: Redmond School, MD   Patient coming from: Home  Chief Complaint: AMS  HPI: [name] is a 87 y.o. male with medical history significant for paroxysmal atrial fibrillation, aortic stenosis, diastolic CHF, atrial fibrillation, diabetes mellitus, CKD 3, bilateral lower extremity amputations. Patient was brought to the ED reports of altered mental status.  Patient's son is at bedside and helps with the history as patient cannot remember most of what happened today. Patient son reports that home health nurse was with patient, he had just check the wounds on patient's back which were improving and had sat him in a chair, when initially he was complaining of pain in his buttock, and then suddenly he started screaming.  Within 30 minutes of this, patient's was unresponsive, though he was awake and his eyes were open.  No seizure activity witnessed, no history of seizures.  Patient's son [name] reports that patient was not back to normal until when MRI was being done here. Patient denies any pain, he denies chest pain, no difficulty breathing.  He has a chronic indwelling Foley catheter.  No vomiting, no loose stools appetite has been good.  ED Course: temp 97.5, heart rate 50s.  Blood pressure 130s to 170s.  O2 sats greater than 95% on room air.  Creatinine at baseline 2.53.  Lactic acid 1.6.  UA with many bacteria and large leukocytes.  Head CT unremarkable.  Portable chest x-ray shows right-sided effusion and atelectasis increased prior.  Brain MRI-partially motion degraded but without evidence of recent infarct hemorrhage or mass.  UA many bacteria large leukocytes.  IV ceftriaxone started.  500 mill bolus given.  Hospitalist to admit.  Review of Systems: As per HPI all other systems reviewed and negative.  Past Medical History:  Diagnosis Date  . Arthritis   . Borderline diabetic   . CHF (congestive  heart failure) ([place])   . Chronic kidney disease   . Diabetes mellitus without complication ([place])   . Dysrhythmia   . Glaucoma   . Hyperlipidemia   . Hypertension   . Hypothyroidism   . Myocardial infarction ([place])   . PVD (peripheral vascular disease) ([place])   . Sleep apnea    Cpap ordered but doesnt use  . Urinary retention due to benign prostatic hyperplasia 12/28/2018    Past Surgical History:  Procedure Laterality Date  . ABDOMINAL AORTOGRAM W/LOWER EXTREMITY N/A 07/27/2018   Procedure: ABDOMINAL AORTOGRAM W/LOWER EXTREMITY;  Surgeon: Lorretta Harp, MD;  Location: [facility];  Service: Cardiovascular;  Laterality: N/A;  . AMPUTATION Right 08/28/2018   Procedure: RIGHT BELOW KNEE AMPUTATION;  Surgeon: [name], MD;  Location: [place];  Service: Orthopedics;  Laterality: Right;  . AMPUTATION Left 12/02/2018   Procedure: LEFT TRANSMETATARSAL AMPUTATION AND NEGATIVE PRESSURE WOUND VAC PLACEMENT;  Surgeon: [name], MD;  Location: [place];  Service: Orthopedics;  Laterality: Left;  . AMPUTATION Left 12/18/2018   Procedure: LEFT BELOW KNEE AMPUTATION;  Surgeon: [name], MD;  Location: [place];  Service: Orthopedics;  Laterality: Left;  . APPENDECTOMY  1943  . BELOW KNEE LEG AMPUTATION Left 12/18/2018  . CATARACT EXTRACTION  2012   x2  . COLONOSCOPY N/A 11/01/2014   Procedure: COLONOSCOPY;  Surgeon: Aviva Signs, MD;  [facility];  Service: Gastroenterology;  Laterality: N/A;  . ESOPHAGOGASTRODUODENOSCOPY N/A 11/01/2014   Procedure: ESOPHAGOGASTRODUODENOSCOPY (EGD);  Surgeon: Elta Guadeloupe  Arnoldo Morale, MD;  [facility];  Service: Gastroenterology;  Laterality: N/A;  . HERNIA REPAIR Left   . INGUINAL HERNIA REPAIR Right 03/01/2013   Procedure: RIGHT INGUINAL HERNIORRHAPHY;  Surgeon: Jamesetta So, MD;  Location: [facility];  Service: General;  Laterality: Right;  . INSERTION OF MESH Right 03/01/2013   Procedure: INSERTION OF MESH;  Surgeon: Jamesetta So,  MD;  [facility];  Service: General;  Laterality: Right;  . LOWER EXTREMITY ANGIOGRAPHY Right 08/25/2018   Procedure: LOWER EXTREMITY ANGIOGRAPHY;  Surgeon: Wellington Hampshire, MD;  Location: [facility];  Service: Cardiovascular;  Laterality: Right;  . LOWER EXTREMITY ANGIOGRAPHY N/A 11/25/2018   Procedure: LOWER EXTREMITY ANGIOGRAPHY;  Surgeon: Wellington Hampshire, MD;  Location: [place] [facility];  Service: Cardiovascular;  Laterality: N/A;  . NM MYOCAR PERF WALL MOTION  06/05/2010   no significant ischemia  . PERIPHERAL VASCULAR ATHERECTOMY Right 08/03/2018   Procedure: PERIPHERAL VASCULAR ATHERECTOMY;  Surgeon: Lorretta Harp, MD;  Location: [facility] [place] [facility];  Service: Cardiovascular;  Laterality: Right;  . PERIPHERAL VASCULAR ATHERECTOMY Left 11/23/2018   Procedure: PERIPHERAL VASCULAR ATHERECTOMY;  Surgeon: Lorretta Harp, MD;  Location: [facility];  Service: Cardiovascular;  Laterality: Left;  sfa with dc balloon  . PERIPHERAL VASCULAR BALLOON ANGIOPLASTY Left 11/25/2018   Procedure: PERIPHERAL VASCULAR BALLOON ANGIOPLASTY;  Surgeon: Wellington Hampshire, MD;  Location: [facility];  Service: Cardiovascular;  Laterality: Left;  popliteal  . PERIPHERAL VASCULAR INTERVENTION Left 11/25/2018   Procedure: PERIPHERAL VASCULAR INTERVENTION;  Surgeon: Wellington Hampshire, MD;  Location: [place] [facility];  Service: Cardiovascular;  Laterality: Left;  tibial peroneal trunk and peroneal stents   . stent  06/14/2010   left leg  . STUMP REVISION Left 01/20/2019   Procedure: REVISION LEFT BELOW KNEE AMPUTATION;  Surgeon: [name], MD;  Location: [place];  Service: Orthopedics;  Laterality: Left;  . US ECHOCARDIOGRAPHY  01/21/2006   moderate mitral annular ca+, mild MR, AOV moderately sclerotic.     reports that he quit smoking about 55 years ago. He has quit using smokeless tobacco.  His smokeless tobacco use included chew. He reports that he does not drink alcohol and  does not use drugs.  Allergies  Allergen Reactions  . Iodinated Diagnostic Agents Other (See Comments)    caused Fever  . Dye Fdc Red [Red Dye] Hives    Tolerated red Docusate, Niferex    Family History  Problem Relation Age of Onset  . Cancer Mother   . Heart attack Father   . Stroke Father   . Heart attack Brother   . Heart attack Brother     Prior to Admission medications   Medication Sig Start Date End Date Taking? Authorizing Provider  acetaminophen (TYLENOL) 500 MG tablet Take 500 mg by mouth every 6 (six) hours as needed for headache (pain).   Yes [provider]  apixaban (ELIQUIS) 5 MG TABS tablet Take 2.5 mg by mouth daily.   Yes [provider]  Ascorbic Acid (VITAMIN C) 1000 MG tablet Take 1,000 mg by mouth daily.    Yes [provider]  atorvastatin (LIPITOR) 40 MG tablet Take 1 tablet (40 mg total) by mouth every evening. 10/25/19  Yes Barrett, Evelene Croon, PA-C  betamethasone dipropionate 0.05 % cream Apply 1 application topically 2 (two) times daily. 12/03/19  Yes [provider]  carvedilol (COREG) 6.25 MG tablet Take 1 tablet (6.25  mg total) by mouth 2 (two) times daily with a meal. 12/13/19  Yes Darrick Meigs, Marge Duncans, MD  Cholecalciferol (VITAMIN D-3) 125 MCG (5000 UT) TABS Take 5,000 Units by mouth every evening.   Yes [provider]  clopidogrel (PLAVIX) 75 MG tablet Take 1 tablet (75 mg total) by mouth daily with breakfast. 12/03/19  Yes Hilty, Nadean Corwin, MD  co-enzyme Q-10 30 MG capsule Take 100 mg by mouth daily.   Yes [provider]  Cranberry-Vitamin C (AZO CRANBERRY URINARY TRACT PO) Take 1 tablet by mouth daily.   Yes [provider]  famotidine (PEPCID) 20 MG tablet Take 1 tablet (20 mg total) by mouth daily. 11/12/18  Yes Johnson, Clanford L, MD  ferrous sulfate 325 (65 FE) MG tablet Take 325 mg by mouth 2 (two) times daily with a meal.   Yes [provider]  furosemide (LASIX) 20 MG tablet  Take 1 tablet (20 mg total) by mouth daily. 12/13/19 12/12/20 Yes Lama, Marge Duncans, MD  glipiZIDE (GLUCOTROL) 5 MG tablet Take 5 mg by mouth 2 (two) times daily.    Yes [provider]  latanoprost (XALATAN) 0.005 % ophthalmic solution Place 1 drop into both eyes at bedtime. 09/30/19  Yes [provider]  levothyroxine (SYNTHROID) 75 MCG tablet Take 75 mcg by mouth daily before breakfast.  02/12/19  Yes [provider]  polyethylene glycol (MIRALAX) 17 g packet Take 17 g by mouth daily as needed for moderate constipation. 12/09/18  Yes Mercy Riding, MD  potassium chloride SA (KLOR-CON) 20 MEQ tablet Take 20 mEq by mouth 2 (two) times daily. 01/28/20 03/28/20 Yes [provider]  pyridoxine (B-6) 100 MG tablet Take 100 mg by mouth daily.   Yes [provider]  Skin Protectants, Misc. [facility] SKIN PROTECTANT EX) Apply 1 application topically daily.   Yes [provider]  traMADol (ULTRAM) 50 MG tablet Take 1 tablet (50 mg total) by mouth every 6 (six) hours as needed. 02/15/19  Yes Lovorn, Megan, MD  vitamin E 400 UNIT capsule Take 400 Units by mouth daily.   Yes [provider]  zinc gluconate 50 MG tablet Take 50 mg by mouth daily.   Yes [provider]  amLODipine (NORVASC) 10 MG tablet Take 1 tablet (10 mg total) by mouth daily. 11/16/19 02/14/20  Pixie Casino, MD  dorzolamide-timolol (COSOPT) 22.3-6.8 MG/ML ophthalmic solution Place 1 drop into both eyes 2 (two) times daily. Patient not taking: No sig reported    [provider]  finasteride (PROSCAR) 5 MG tablet Take 1 tablet (5 mg total) by mouth every evening. Patient not taking: No sig reported 09/22/18   Angiulli, Lavon Paganini, PA-C  hydrOXYzine (ATARAX/VISTARIL) 25 MG tablet Take 25 mg by mouth 4 (four) times daily. Patient not taking: No sig reported 11/30/19   [provider]  Misc Natural Products ([place] MSM FORMULA PO) Take 1 tablet by mouth  2 (two) times daily. Patient not taking: No sig reported    [provider]  ONETOUCH ULTRA test strip 1 each 2 (two) times daily. 04/15/19   [provider]  tamsulosin (FLOMAX) 0.4 MG CAPS capsule Take 0.4 mg by mouth daily. Patient not taking: No sig reported 11/13/19   [provider]    Physical Exam: Vitals:   02/24/20 1730 02/24/20 1745 02/24/20 1800 02/24/20 1830  BP: (!) 142/60  (!) 153/64 (!) 172/78  Pulse: (!) 52 (!) 54 (!) 55 (!) 56  Resp: 17 (!) 21 14 14   Temp:      SpO2: 98% 98% 97% 98%    Constitutional: NAD, calm, comfortable Vitals:   02/24/20 1730 02/24/20 1745 02/24/20 1800 02/24/20 1830  BP: (!) 142/60  (!) 153/64 (!) 172/78  Pulse: (!) 52 (!) 54 (!) 55 (!) 56  Resp: 17 (!) 21 14 14   Temp:      SpO2: 98% 98% 97% 98%   Eyes: PERRL, lids and conjunctivae normal ENMT: Mucous membranes are moist.  Neck: normal, supple, no masses, no thyromegaly Respiratory: clear to auscultation bilaterally, no wheezing, no crackles. Normal respiratory effort. No accessory muscle use.  Cardiovascular: Regular rate and rhythm,   2+ pedal pulses.  Abdomen: no tenderness, no masses palpated. No hepatosplenomegaly. Bowel sounds positive.  Musculoskeletal: no clubbing / cyanosis. bilat bka. Good ROM, no contractures. Normal muscle tone.  Skin: no rashes, lesions, ulcers. No induration Neurologic: No facial asymmetry, speech clear and fluent without aphasia, 4+/5 grip strength bilateral upper extremity, bilateral BKA-able to move both spontaneously against gravity.  Sensation intact. Psychiatric: Normal judgment and insight. Alert and oriented x 3. Normal mood.   Labs on Admission: I have personally reviewed following labs and imaging studies  CBC: Recent Labs  Lab 02/24/20 1420  WBC 9.7  NEUTROABS 5.9  HGB 13.5  HCT 40.9  MCV 97.8  PLT 811   Basic Metabolic Panel: Recent Labs  Lab 02/24/20 1420  NA 135  K 3.7  CL 97*  CO2 26  GLUCOSE 224*   BUN 35*  CREATININE 2.53*  CALCIUM 9.4   GFR: CrCl cannot be calculated (Unknown ideal weight.). Liver Function Tests: Recent Labs  Lab 02/24/20 1420  AST 25  ALT 21  ALKPHOS 46  BILITOT 1.0  PROT 7.7  ALBUMIN 3.7   Urine analysis:    Component Value Date/Time   COLORURINE YELLOW 02/24/2020 1452   APPEARANCEUR TURBID (A) 02/24/2020 1452   LABSPEC 1.010 02/24/2020 1452   PHURINE 7.0 02/24/2020 1452   GLUCOSEU NEGATIVE 02/24/2020 1452   HGBUR SMALL (A) 02/24/2020 1452   BILIRUBINUR NEGATIVE 02/24/2020 1452   [place] 02/24/2020 1452   PROTEINUR 100 (A) 02/24/2020 1452   NITRITE NEGATIVE 02/24/2020 1452   LEUKOCYTESUR LARGE (A) 02/24/2020 1452    Radiological Exams on Admission: CT Head Wo Contrast  Result Date: 02/24/2020 CLINICAL DATA:  Mental status change EXAM: CT HEAD WITHOUT CONTRAST TECHNIQUE: Contiguous axial images were obtained from the base of the skull through the vertex without intravenous contrast. COMPARISON:  CT head 12/06/2019 FINDINGS: Brain: Generalized atrophy. Mild white matter hypodensity bilaterally appears chronic. Negative for acute infarct, hemorrhage, mass Vascular: Negative for hyperdense vessel Skull: Negative calvarium. Sinuses/Orbits: Paranasal sinuses clear. Bilateral cataract extraction Other: None IMPRESSION: No acute abnormality No interval change. Atrophy and chronic microvascular ischemic change in the white matter. Electronically Signed   By: Franchot Gallo M.D.   On: 02/24/2020 14:42   MR BRAIN WO CONTRAST  Result Date: 02/24/2020 CLINICAL DATA:  Mental status change EXAM: MRI HEAD WITHOUT CONTRAST TECHNIQUE: Multiplanar, multiecho pulse sequences of the brain and surrounding structures were obtained without intravenous contrast. COMPARISON:  January 2021 FINDINGS: Motion artifact is present on some sequences. Axial T1 and coronal T2 sequences were not obtained. Axial T2 FLAIR is particularly degraded. Brain: There is no acute  infarction or intracranial hemorrhage. There is no intracranial mass, mass effect, or edema. There is no hydrocephalus. There is diffuse pachymeningeal thickening versus thin subdural collections bilaterally  along the convexities and the falx without mass effect. In retrospect, likely present on prior study. Prominence of the ventricles and sulci reflects generalized parenchymal volume loss. Patchy T2 hyperintensity in the supratentorial white matter is nonspecific but may reflect chronic microvascular ischemic changes. Vascular: Major vessel flow voids at the skull base are preserved. Skull and upper cervical spine: Marrow signal is within normal limits. Sinuses/Orbits: Minor mucosal thickening. Bilateral lens replacements. Other: Sella is unremarkable.  Mastoid air cells are clear. IMPRESSION: Partially motion degraded study. No evidence of recent infarction, hemorrhage, or mass. Suspected diffuse pachymeningeal thickening versus thin subdural collections along the bilateral cerebral convexities and falx without mass effect. Likely present on 2021 examination. Chronic microvascular ischemic changes. Electronically Signed   By: Macy Mis M.D.   On: 02/24/2020 16:44   DG Chest Port 1 View  Result Date: 02/24/2020 CLINICAL DATA:  Altered mental status EXAM: PORTABLE CHEST 1 VIEW COMPARISON:  12/08/2019 FINDINGS: Cardiac shadow is stable. Aortic calcifications are seen. Right lung is clear. Left lung demonstrates evidence of moderate effusion inline in 1 atelectatic changes. No bony abnormality is seen. IMPRESSION: Right-sided effusion and atelectasis increased from the prior exam. Electronically Signed   By: Inez Catalina M.D.   On: 02/24/2020 15:06    EKG: Independently reviewed.  Rhythm is regular but P waves not quite discernible, rate 55. Old Right bundle branch block.  QTc 460.  Assessment/Plan Principal Problem:   AMS (altered mental status) Active Problems:   Essential hypertension, benign    Chronic diastolic CHF (congestive heart failure) ([facility])   Atrial fibrillation, chronic   Type 2 diabetes mellitus with vascular disease ([facility])   Chronic kidney disease, stage 3a   Essential hypertension   Urinary retention due to benign prostatic hyperplasia   Altered mental status/metabolic encephalopathy -transient and resolved.  History of atrial fibrillation, he is on 2.5 mg of Eliquis daily.  Head CT and subsequent MRI without acute abnormality.  TIA or encephalopathy related to urinary tract infection. -IV antibiotics -B12, folate in a.m. - TSH - Continue statins, Eliquis.    Urinary retention with chronic indwelling Foley catheter-UA at this time suggestive of UTI with many bacteria and large leukocytes.  Afebrile without leukocytosis.  Rules out for sepsis.  History of Pseudomonas UTI. ?  Colonization. -IV ceftazidime -Follow-up urine cultures  CAD--recent NSTEMI in October 2021--- troponins flat.  Denies chest pain. -Continue Lipitor, Coreg and Plavix  HFpEF--patient has history of chronic diastolic dysfunction CHF, EF by echo from August 2021 is  95 to 60% -Stable and compensated  CKD IV--creatinine 2.53, at baseline.   -renally adjust medications, avoid nephrotoxic agents   Hypothyroidism--stable, continue levothyroxine 75 mcg daily - Check TSH  DM2- A1c is 8.5, blood sugar 224.   -Resume glipizide - SSI- S Ac/Hs  Chronic atrial fibrillation--- continue apixaban for stroke prophylaxis, continue Coreg for rate control. -Monitor closely, patient on apixaban and Plavix.   DVT prophylaxis: Eliquis Code Status:  Full code-prior documentation of DNR, talked to patient's son- [name], he wants patient full code. Family Communication: Son Kerrion Kemppainen at bedside. Disposition Plan:  ~ 2 days Consults called: None Admission status:  Obs, med surg.   Bethena Roys MD [facility]  02/24/2020, 10:15 PM

## 2020-02-24 NOTE — ED Triage Notes (Signed)
Son states a nurse visited today and checked breakdown area on buttocks. States he began to scream after nurse left. Not responding at present.

## 2020-02-24 NOTE — ED Notes (Signed)
Pt. cbg in triage 227

## 2020-02-24 NOTE — ED Provider Notes (Signed)
[facility] EMERGENCY DEPARTMENT Provider Note   CSN: [id number] Arrival date & time: 02/24/20  1317     History Chief Complaint  Patient presents with  . Altered Mental Status    CAMRIN GEARHEART is a 87 y.o. male.  ASHUR GLATFELTER is a 87 y.o. male with history of hypertension, hyperlipidemia, MI, CHF, peripheral vascular disease s/p bilateral BKA's, diabetes, who presents to the emergency department for evaluation of acute onset altered mental status.  Patient's son is at bedside who provides majority of the history.  Reports that the patient woke up and was at his baseline this morning, they got the patient up using lift as per usual, he ate breakfast, the home health nurse came over and assessed the sores on both of his buttocks, which she reported seem to be looking good without any worsening.  Then got him up to use the bathroom, he has been dealing with some constipation and a hemorrhoid, he was able to have a small bowel movement, they then got him up to the recliner after sitting in the recliner he began screaming briefly and then stopped responding.  Son did not notice any seizure-like activity.  Son reports he was alert but would not speak to anyone or follow commands.  Reports now he is minimally responsive, but still not at his baseline.  Has never had similar episodes.  During this episode did not exhibit any facial droop or weakness.  No fevers, recently has not been complaining of any chest pain, shortness of breath or abdominal pain and has been at baseline, nothing out of ordinary for his routine this morning.  Patient will follow commands, but will not answer any questions for me.  Level 5 caveat: Altered mental status        Past Medical History:  Diagnosis Date  . Arthritis   . Borderline diabetic   . CHF (congestive heart failure) ([place])   . Chronic kidney disease   . Diabetes mellitus without complication ([place])   . Dysrhythmia   . Glaucoma   . Hyperlipidemia   .  Hypertension   . Hypothyroidism   . Myocardial infarction ([place])   . PVD (peripheral vascular disease) ([place])   . Sleep apnea    Cpap ordered but doesnt use  . Urinary retention due to benign prostatic hyperplasia 12/28/2018    Patient Active Problem List   Diagnosis Date Noted  . PNA (pneumonia) 12/06/2019  . Sepsis due to PNA 12/06/2019  . Acute metabolic encephalopathy 83/38/2505  . AKI (acute kidney injury) ([place])   . Aortic valve stenosis   . Palliative care encounter   . NSTEMI (non-ST elevated myocardial infarction) ([place]) 10/04/2019  . Acute kidney injury superimposed on CKD ([place]) 10/04/2019  . Hypokalemia 10/04/2019  . Weakness generalized 04/05/2019  . S/P BKA (below knee amputation) unilateral, left ([place]) 02/15/2019  . Pressure injury of skin 02/03/2019  . TIA (transient ischemic attack) 02/03/2019  . Transient speech disturbance 02/02/2019  . Chronic indwelling Foley catheter 01/22/2019  . Postoperative wound infection 01/20/2019  . Normocytic anemia 01/18/2019  . Cellulitis 01/18/2019  . Infection of deep incisional surgical site after procedure   . Abnormal urinalysis   . Labile blood glucose   . Urinary retention   . S/P bilateral BKA (below knee amputation) ([place])   . Acute blood loss anemia   . Urinary retention due to benign prostatic hyperplasia 12/28/2018  . Unilateral complete BKA, left, initial encounter ([place]) 12/25/2018  . Dehiscence  of amputation stump ([place])   . History of transmetatarsal amputation of left foot ([place]) 12/09/2018  . Critical limb ischemia with history of revascularization of same extremity ([place])   . Gangrene of left foot ([place])   . Diabetic ulcer of toe of left foot associated with type 2 diabetes mellitus ([place])   . Cellulitis of left lower extremity   . Toe infection 11/18/2018  . Peripheral edema   . Acute on chronic diastolic CHF (congestive heart failure) ([place]) 11/09/2018  . S/P BKA (below knee amputation) unilateral, right ([place])  10/05/2018  . Hyponatremia   . Essential hypertension   . Postoperative pain   . Pain of left heel   . Unable to maintain weight-bearing   . Type 2 diabetes mellitus with diabetic peripheral angiopathy with gangrene ([place])   . Anemia due to acute blood loss   . Chronic kidney disease, stage 3a   . Acquired absence of right leg below knee ([place]) 09/07/2018  . Gangrene of right foot ([place])   . DNR (do not resuscitate) discussion   . Goals of care, counseling/discussion   . Palliative care by specialist   . Type 2 diabetes mellitus with vascular disease ([place])   . Severe protein-calorie malnutrition ([place])   . Ischemic foot 08/20/2018  . Critical lower limb ischemia ([place]) 08/03/2018  . Type 2 diabetes mellitus with foot ulcer, without long-term current use of insulin ([place])   . Gastroesophageal reflux disease   . Cellulitis of great toe of right foot 05/29/2018  . Atrial fibrillation, chronic   . Chest pain 07/25/2017  . PAF (paroxysmal atrial fibrillation) ([place]) 07/25/2017  . BPH (benign prostatic hyperplasia) 07/25/2017  . Shortness of breath 05/11/2017  . Hypothyroidism 05/11/2017  . Chronic diastolic CHF (congestive heart failure) ([place]) 05/11/2017  . Moderate aortic stenosis 06/12/2016  . RBBB 06/12/2016  . Peripheral vascular disease ([place]) 08/04/2012  . Essential hypertension, benign 08/04/2012  . Hyperlipidemia 08/04/2012  . Dizziness 08/04/2012    Past Surgical History:  Procedure Laterality Date  . ABDOMINAL AORTOGRAM W/LOWER EXTREMITY N/A 07/27/2018   Procedure: ABDOMINAL AORTOGRAM W/LOWER EXTREMITY;  Surgeon: Lorretta Harp, MD;  Location: [facility];  Service: Cardiovascular;  Laterality: N/A;  . AMPUTATION Right 08/28/2018   Procedure: RIGHT BELOW KNEE AMPUTATION;  Surgeon: [name], MD;  Location: [place];  Service: Orthopedics;  Laterality: Right;  . AMPUTATION Left 12/02/2018   Procedure: LEFT TRANSMETATARSAL AMPUTATION AND NEGATIVE PRESSURE WOUND VAC  PLACEMENT;  Surgeon: [name], MD;  Location: [place];  Service: Orthopedics;  Laterality: Left;  . AMPUTATION Left 12/18/2018   Procedure: LEFT BELOW KNEE AMPUTATION;  Surgeon: [name], MD;  Location: [place];  Service: Orthopedics;  Laterality: Left;  . APPENDECTOMY  1943  . BELOW KNEE LEG AMPUTATION Left 12/18/2018  . CATARACT EXTRACTION  2012   x2  . COLONOSCOPY N/A 11/01/2014   Procedure: COLONOSCOPY;  Surgeon: Aviva Signs, MD;  [facility];  Service: Gastroenterology;  Laterality: N/A;  . ESOPHAGOGASTRODUODENOSCOPY N/A 11/01/2014   Procedure: ESOPHAGOGASTRODUODENOSCOPY (EGD);  Surgeon: Aviva Signs, MD;  [facility];  Service: Gastroenterology;  Laterality: N/A;  . HERNIA REPAIR Left   . INGUINAL HERNIA REPAIR Right 03/01/2013   Procedure: RIGHT INGUINAL HERNIORRHAPHY;  Surgeon: Jamesetta So, MD;  Location: [facility];  Service: General;  Laterality: Right;  . INSERTION OF MESH Right 03/01/2013   Procedure: INSERTION OF MESH;  Surgeon: Jamesetta So, MD;  Location:  [facility];  Service: General;  Laterality: Right;  . LOWER EXTREMITY ANGIOGRAPHY Right 08/25/2018   Procedure: LOWER EXTREMITY ANGIOGRAPHY;  Surgeon: Wellington Hampshire, MD;  Location: [place] [facility];  Service: Cardiovascular;  Laterality: Right;  . LOWER EXTREMITY ANGIOGRAPHY N/A 11/25/2018   Procedure: LOWER EXTREMITY ANGIOGRAPHY;  Surgeon: Wellington Hampshire, MD;  Location: [place] [facility];  Service: Cardiovascular;  Laterality: N/A;  . NM MYOCAR PERF WALL MOTION  06/05/2010   no significant ischemia  . PERIPHERAL VASCULAR ATHERECTOMY Right 08/03/2018   Procedure: PERIPHERAL VASCULAR ATHERECTOMY;  Surgeon: Lorretta Harp, MD;  Location: [facility];  Service: Cardiovascular;  Laterality: Right;  . PERIPHERAL VASCULAR ATHERECTOMY Left 11/23/2018   Procedure: PERIPHERAL VASCULAR ATHERECTOMY;  Surgeon: Lorretta Harp, MD;  Location: [facility];  Service: Cardiovascular;   Laterality: Left;  sfa with dc balloon  . PERIPHERAL VASCULAR BALLOON ANGIOPLASTY Left 11/25/2018   Procedure: PERIPHERAL VASCULAR BALLOON ANGIOPLASTY;  Surgeon: Wellington Hampshire, MD;  Location: [place] [facility];  Service: Cardiovascular;  Laterality: Left;  popliteal  . PERIPHERAL VASCULAR INTERVENTION Left 11/25/2018   Procedure: PERIPHERAL VASCULAR INTERVENTION;  Surgeon: Wellington Hampshire, MD;  Location: [place] [facility];  Service: Cardiovascular;  Laterality: Left;  tibial peroneal trunk and peroneal stents   . stent  06/14/2010   left leg  . STUMP REVISION Left 01/20/2019   Procedure: REVISION LEFT BELOW KNEE AMPUTATION;  Surgeon: [name], MD;  Location: [place];  Service: Orthopedics;  Laterality: Left;  . US ECHOCARDIOGRAPHY  01/21/2006   moderate mitral annular ca+, mild MR, AOV moderately sclerotic.       Family History  Problem Relation Age of Onset  . Cancer Mother   . Heart attack Father   . Stroke Father   . Heart attack Brother   . Heart attack Brother     Social History   Tobacco Use  . Smoking status: Former Smoker    Quit date: 01/13/1965    Years since quitting: 55.1  . Smokeless tobacco: Former Systems developer    Types: Secondary school teacher  . Vaping Use: Never used  Substance Use Topics  . Alcohol use: No  . Drug use: No    Home Medications Prior to Admission medications   Medication Sig Start Date End Date Taking? Authorizing Provider  acetaminophen (TYLENOL) 500 MG tablet Take 500 mg by mouth every 6 (six) hours as needed for headache (pain).    [provider]  amLODipine (NORVASC) 10 MG tablet Take 1 tablet (10 mg total) by mouth daily. 11/16/19 02/14/20  Hilty, Nadean Corwin, MD  apixaban (ELIQUIS) 5 MG TABS tablet Take 2.5 mg by mouth 2 (two) times daily.     [provider]  Ascorbic Acid (VITAMIN C) 1000 MG tablet Take 1,000 mg by mouth daily.     [provider]  atorvastatin (LIPITOR) 40 MG tablet Take 1 tablet (40 mg total) by  mouth every evening. 10/25/19   Barrett, Evelene Croon, PA-C  betamethasone dipropionate 0.05 % cream Apply 1 application topically 2 (two) times daily.  Patient not taking: Reported on 01/11/2020 12/03/19   [provider]  carvedilol (COREG) 6.25 MG tablet Take 1 tablet (6.25 mg total) by mouth 2 (two) times daily with a meal. 12/13/19   Oswald Hillock, MD  Cholecalciferol (VITAMIN D-3) 125 MCG (5000 UT) TABS Take 5,000 Units by mouth every evening.    [provider]  clopidogrel (PLAVIX) 75 MG tablet Take 1 tablet (75 mg total) by mouth daily with breakfast. 12/03/19   Hilty, Nadean Corwin, MD  co-enzyme Q-10 30 MG capsule Take 100 mg by mouth daily.    [provider]  dorzolamide-timolol (COSOPT) 22.3-6.8 MG/ML ophthalmic solution Place 1 drop into both eyes 2 (two) times daily. Patient not taking: Reported on 01/11/2020    [provider]  famotidine (PEPCID) 20 MG tablet Take 1 tablet (20 mg total) by mouth daily. 11/12/18   Johnson, Clanford L, MD  ferrous sulfate 325 (65 FE) MG tablet Take 325 mg by mouth 2 (two) times daily with a meal.    [provider]  finasteride (PROSCAR) 5 MG tablet Take 1 tablet (5 mg total) by mouth every evening. Patient not taking: No sig reported 09/22/18   Angiulli, Lavon Paganini, PA-C  furosemide (LASIX) 20 MG tablet Take 1 tablet (20 mg total) by mouth daily. 12/13/19 12/12/20  Oswald Hillock, MD  glipiZIDE (GLUCOTROL) 5 MG tablet Take 5 mg by mouth 2 (two) times daily.     [provider]  hydrOXYzine (ATARAX/VISTARIL) 25 MG tablet Take 25 mg by mouth 4 (four) times daily. Patient not taking: No sig reported 11/30/19   [provider]  latanoprost (XALATAN) 0.005 % ophthalmic solution Place 1 drop into both eyes at bedtime. 09/30/19   [provider]  levothyroxine (SYNTHROID) 75 MCG tablet Take 75 mcg by mouth daily before breakfast.  02/12/19   [provider]  Misc Natural Products  ([place] MSM FORMULA PO) Take 1 tablet by mouth 2 (two) times daily. Patient not taking: Reported on 01/11/2020    [provider]  [facility] ULTRA test strip 1 each 2 (two) times daily. 04/15/19   [provider]  polyethylene glycol (MIRALAX) 17 g packet Take 17 g by mouth daily as needed for moderate constipation. 12/09/18   Mercy Riding, MD  pyridoxine (B-6) 100 MG tablet Take 100 mg by mouth daily.    [provider]  tamsulosin (FLOMAX) 0.4 MG CAPS capsule Take 0.4 mg by mouth daily. Patient not taking: No sig reported 11/13/19   [provider]  traMADol (ULTRAM) 50 MG tablet Take 1 tablet (50 mg total) by mouth every 6 (six) hours as needed. Patient not taking: Reported on 01/11/2020 02/15/19   Courtney Heys, MD  vitamin E 400 UNIT capsule Take 400 Units by mouth daily.    [provider]  zinc gluconate 50 MG tablet Take 50 mg by mouth daily.    [provider]    Allergies    Iodinated diagnostic agents and Dye fdc red [red dye]  Review of Systems   Review of Systems  Unable to perform ROS: Mental status change    Physical Exam Updated Vital Signs BP (!) 166/65 (BP Location: Right Arm)   Pulse (!) 58   Temp 98 F (36.7 C)   Resp 18   SpO2 99%   Physical Exam Vitals and nursing note reviewed.  Constitutional:      General: He is not in acute distress.    Appearance: Normal appearance. He is well-developed and well-nourished. He is ill-appearing. He is not diaphoretic.     Comments: Patient is alert, following commands but not responding to questions, chronically ill-appearing but does not appear to be in acute distress  HENT:     Head: Normocephalic and atraumatic.     Nose: Nose normal.  Mouth/Throat:     Mouth: Oropharynx is clear and moist. Mucous membranes are moist.     Pharynx: Oropharynx is clear.  Eyes:     General:        Right eye: No discharge.        Left eye: No discharge.      Extraocular Movements: Extraocular movements intact and EOM normal.     Pupils: Pupils are equal, round, and reactive to light.  Cardiovascular:     Rate and Rhythm: Normal rate and regular rhythm.     Pulses: Normal pulses and intact distal pulses.     Heart sounds: Normal heart sounds. No murmur heard. No gallop.   Pulmonary:     Effort: Pulmonary effort is normal. No respiratory distress.     Breath sounds: Normal breath sounds. No wheezing or rales.     Comments: Respirations equal and unlabored, patient able to speak in full sentences, lungs clear to auscultation bilaterally Abdominal:     General: Bowel sounds are normal. There is no distension.     Palpations: Abdomen is soft. There is no mass.     Tenderness: There is no abdominal tenderness. There is no guarding.     Comments: Abdomen is soft, nondistended, bowel sounds present throughout, nontender to palpation in all quadrants  Genitourinary:    Comments: Chronic indwelling Foley catheter present Musculoskeletal:        General: No deformity or edema.     Cervical back: Neck supple.     Comments: Bilateral BKA's present with prosthetics in place  Skin:    General: Skin is warm.     Capillary Refill: Capillary refill takes less than 2 seconds.     Comments: Superficial pressure wounds to bilateral buttocks without significant skin breakdown, no drainage or purulence  Neurological:     Mental Status: He is alert.     Coordination: Coordination normal.     Comments: Patient initially nonverbal on arrival, but then started stating he was cold and responding to some questions, able to follow commands CN III-XII intact Normal strength in upper and lower extremities bilaterally including dorsiflexion and plantar flexion, strong and equal grip strength Sensation normal to light and sharp touch Moves extremities without ataxia, coordination intact  Psychiatric:        Mood and Affect: Mood normal.        Behavior: Behavior  normal.     ED Results / Procedures / Treatments   Labs (all labs ordered are listed, but only abnormal results are displayed) Labs Reviewed  COMPREHENSIVE METABOLIC PANEL - Abnormal; Notable for the following components:      Result Value   Chloride 97 (*)    Glucose, Bld 224 (*)    BUN 35 (*)    Creatinine, Ser 2.53 (*)    GFR, Estimated 23 (*)    All other components within normal limits  CBC WITH DIFFERENTIAL/PLATELET - Abnormal; Notable for the following components:   RBC 4.18 (*)    Eosinophils Absolute 0.7 (*)    All other components within normal limits  URINALYSIS, ROUTINE W REFLEX MICROSCOPIC - Abnormal; Notable for the following components:   APPearance TURBID (*)    Hgb urine dipstick SMALL (*)    Protein, ur 100 (*)    Leukocytes,Ua LARGE (*)    WBC, UA >50 (*)    Bacteria, UA MANY (*)    All other components within normal limits  GLUCOSE, CAPILLARY - Abnormal; Notable for the  following components:   Glucose-Capillary 171 (*)    All other components within normal limits  GLUCOSE, CAPILLARY - Abnormal; Notable for the following components:   Glucose-Capillary 116 (*)    All other components within normal limits  BASIC METABOLIC PANEL - Abnormal; Notable for the following components:   Potassium 3.4 (*)    Glucose, Bld 132 (*)    BUN 35 (*)    Creatinine, Ser 2.64 (*)    GFR, Estimated 22 (*)    All other components within normal limits  HEMOGLOBIN A1C - Abnormal; Notable for the following components:   Hgb A1c MFr Bld 7.5 (*)    All other components within normal limits  GLUCOSE, CAPILLARY - Abnormal; Notable for the following components:   Glucose-Capillary 199 (*)    All other components within normal limits  GLUCOSE, CAPILLARY - Abnormal; Notable for the following components:   Glucose-Capillary 227 (*)    All other components within normal limits  GLUCOSE, CAPILLARY - Abnormal; Notable for the following components:   Glucose-Capillary 211 (*)    All  other components within normal limits  GLUCOSE, CAPILLARY - Abnormal; Notable for the following components:   Glucose-Capillary 166 (*)    All other components within normal limits  TROPONIN I (HIGH SENSITIVITY) - Abnormal; Notable for the following components:   Troponin I (High Sensitivity) 24 (*)    All other components within normal limits  TROPONIN I (HIGH SENSITIVITY) - Abnormal; Notable for the following components:   Troponin I (High Sensitivity) 26 (*)    All other components within normal limits  SARS CORONAVIRUS 2 (TAT 6-24 HRS)  URINE CULTURE  LACTIC ACID, PLASMA  LACTIC ACID, PLASMA  TSH  VITAMIN B12  FOLATE  VITAMIN B12  FOLATE  MAGNESIUM  BASIC METABOLIC PANEL    EKG EKG Interpretation  Date/Time:  Thursday February 24 2020 14:13:15 EST Ventricular Rate:  55 PR Interval:    QRS Duration: 164 QT Interval:  480 QTC Calculation: 460 R Axis:   53 Text Interpretation: Sinus or ectopic atrial rhythm Right bundle branch block Artifact in lead(s) I aVL V1 V2 V3 No significant change since last tracing Confirmed by Calvert Cantor [phone]) on 02/25/2020 11:43:22 AM   Radiology CT Head Wo Contrast  Result Date: 02/24/2020 CLINICAL DATA:  Mental status change EXAM: CT HEAD WITHOUT CONTRAST TECHNIQUE: Contiguous axial images were obtained from the base of the skull through the vertex without intravenous contrast. COMPARISON:  CT head 12/06/2019 FINDINGS: Brain: Generalized atrophy. Mild white matter hypodensity bilaterally appears chronic. Negative for acute infarct, hemorrhage, mass Vascular: Negative for hyperdense vessel Skull: Negative calvarium. Sinuses/Orbits: Paranasal sinuses clear. Bilateral cataract extraction Other: None IMPRESSION: No acute abnormality No interval change. Atrophy and chronic microvascular ischemic change in the white matter. Electronically Signed   By: Franchot Gallo M.D.   On: 02/24/2020 14:42   MR BRAIN WO CONTRAST  Result Date:  02/24/2020 CLINICAL DATA:  Mental status change EXAM: MRI HEAD WITHOUT CONTRAST TECHNIQUE: Multiplanar, multiecho pulse sequences of the brain and surrounding structures were obtained without intravenous contrast. COMPARISON:  January 2021 FINDINGS: Motion artifact is present on some sequences. Axial T1 and coronal T2 sequences were not obtained. Axial T2 FLAIR is particularly degraded. Brain: There is no acute infarction or intracranial hemorrhage. There is no intracranial mass, mass effect, or edema. There is no hydrocephalus. There is diffuse pachymeningeal thickening versus thin subdural collections bilaterally along the convexities and the falx without mass effect. In  retrospect, likely present on prior study. Prominence of the ventricles and sulci reflects generalized parenchymal volume loss. Patchy T2 hyperintensity in the supratentorial white matter is nonspecific but may reflect chronic microvascular ischemic changes. Vascular: Major vessel flow voids at the skull base are preserved. Skull and upper cervical spine: Marrow signal is within normal limits. Sinuses/Orbits: Minor mucosal thickening. Bilateral lens replacements. Other: Sella is unremarkable.  Mastoid air cells are clear. IMPRESSION: Partially motion degraded study. No evidence of recent infarction, hemorrhage, or mass. Suspected diffuse pachymeningeal thickening versus thin subdural collections along the bilateral cerebral convexities and falx without mass effect. Likely present on 2021 examination. Chronic microvascular ischemic changes. Electronically Signed   By: Macy Mis M.D.   On: 02/24/2020 16:44   DG Chest Port 1 View  Result Date: 02/24/2020 CLINICAL DATA:  Altered mental status EXAM: PORTABLE CHEST 1 VIEW COMPARISON:  12/08/2019 FINDINGS: Cardiac shadow is stable. Aortic calcifications are seen. Right lung is clear. Left lung demonstrates evidence of moderate effusion inline in 1 atelectatic changes. No bony abnormality is  seen. IMPRESSION: Right-sided effusion and atelectasis increased from the prior exam. Electronically Signed   By: Inez Catalina M.D.   On: 02/24/2020 15:06    Procedures Procedures   Medications Ordered in ED Medications  cefTRIAXone (ROCEPHIN) 1 g in sodium chloride 0.9 % 100 mL IVPB (0 g Intravenous Stopped 02/24/20 1819)  sodium chloride 0.9 % bolus 500 mL (0 mLs Intravenous Stopped 02/24/20 2005)    ED Course  I have reviewed the triage vital signs and the nursing notes.  Pertinent labs & imaging results that were available during my care of the patient were reviewed by me and considered in my medical decision making (see chart for details).    MDM Rules/Calculators/A&P                         87 y.o. male presents to the ED with complaints of altered mental status, this involves an extensive number of treatment options, and is a complaint that carries with it a high risk of complications and morbidity.  The differential diagnosis includes stroke/TIA, seizure, syncope, infection, metabolic encephalopathy  On arrival pt alert and able to follow commands for the most part, but is not responding to questions, vitals on arrival show hypertension and mild bradycardia, afebrile.  Patient initially not responding to questions but seems to understand words and is able to follow commands he has no other focal neurologic deficits and shortly after my initial evaluation he began speaking saying he was cold, but son states he still not at his baseline, he has no abdominal tenderness, heart with mild bradycardia, lungs clear.  Additional history obtained from patient son. Previous records obtained and reviewed via EMR  Lab Tests:  I Ordered, reviewed, and interpreted labs, which included:  CBC: No leukocytosis, normal hemoglobin Glucose 224, creatinine 2.53, slightly increased from baseline with elevated BUN, patient may be mildly dehydrated, normal LFTs Troponin: Initial troponin minimally  elevated, delta only increased by 2 and patient declining chest pain Lactic acid: Not elevated UA: Turbid with large leukocytes, greater than 50 WBCs and many bacteria with white blood cell clumps present, expect to see some signs of chronic colonization but this looks worse than patient's urine typically does in the setting of Foley catheter, concern for UTI that could be contributing to AMS.  Will send culture and treat with Rocephin  Imaging Studies ordered:  I ordered imaging studies which  included CT head, chest x-ray, I independently visualized and interpreted imaging which showed right-sided pleural effusion without other acute cardiopulmonary disease head CT without acute intracranial abnormality, atrophy and chronic microvascular ischemic changes.  ED Course:  Case discussed with Dr. Roderic Palau who is seen and evaluated patient as well, reassured by normal head CT, but given the episode of aphasia, will get MRI to rule out stroke or other acute intracranial abnormality  MRI brain without acute abnormality  I have ordered Rocephin and IV fluids for UTI and mild dehydration.  Patient's mental status is improving, he is now verbal and able to answer most questions, but son reports he is still not back at baseline and seems altered compared to his usual.  Question whether episode of unresponsiveness could have been syncopal episode versus TIA, versus seizure versus encephalopathy in the setting of infection.  Will require admission.  I consulted Dr. Denton Brick with [facility] and discussed lab and imaging findings, she will see and admit the patient   Portions of this note were generated with Dragon dictation software. Dictation errors may occur despite best attempts at proofreading.   Final Clinical Impression(s) / ED Diagnoses Final diagnoses:  Altered mental status, unspecified altered mental status type  Acute UTI    Rx / DC Orders ED Discharge Orders    None       Janet Berlin 02/26/20 0054    Milton Ferguson, MD 02/27/20 [phone]

## 2020-02-24 NOTE — ED Triage Notes (Signed)
Altered mental status x 30 minutes

## 2020-02-24 NOTE — Progress Notes (Signed)
Pharmacy Antibiotic Note  [name] is a 87 y.o. male admitted on 02/24/2020 with AMS, suspected UTI. Hx of pseudomonas and E faecalis UTI in Nov 2021 with chronic indwelling foley catheter. Pharmacy has been consulted for ceftazidime dosing.  No current weight, last weight in EMR was 99.3kg at 01/28/20 office visit. Clcr ~ 21 ml/min.   Plan: Start ceftazidime 2g Q24hr  Monitor cultures, clinical status, renal fx Narrow abx as able and f/u duration    Temp (24hrs), Avg:97.8 F (36.6 C), Min:97.5 F (36.4 C), Max:98 F (36.7 C)  Recent Labs  Lab 02/24/20 1420 02/24/20 1817  WBC 9.7  --   CREATININE 2.53*  --   LATICACIDVEN 1.6 1.0    CrCl cannot be calculated (Unknown ideal weight.).    Allergies  Allergen Reactions  . Iodinated Diagnostic Agents Other (See Comments)    caused Fever  . Dye Fdc Red [Red Dye] Hives    Tolerated red Docusate, Niferex    Antimicrobials this admission: Ceftaz 2/10 >>   CTX 2/10 x1  Microbiology results: 2/10 UCx: pend    Thank you for allowing pharmacy to be a part of this patient's care.  Benetta Spar, PharmD, BCPS, BCCP Clinical Pharmacist  Please check AMION for all [place] phone numbers After 10:00 PM, call [place] [phone]

## 2020-02-25 ENCOUNTER — Encounter (HOSPITAL_COMMUNITY): Payer: Self-pay | Admitting: Internal Medicine

## 2020-02-25 ENCOUNTER — Inpatient Hospital Stay (HOSPITAL_COMMUNITY)
Admit: 2020-02-25 | Discharge: 2020-02-25 | Disposition: A | Discharge status: Home / Self Care | Payer: Medicare Other | Attending: Internal Medicine | Admitting: Internal Medicine

## 2020-02-25 DIAGNOSIS — R404 Transient alteration of awareness: Secondary | ICD-10-CM | POA: Diagnosis not present

## 2020-02-25 DIAGNOSIS — G9341 Metabolic encephalopathy: Secondary | ICD-10-CM | POA: Diagnosis present

## 2020-02-25 DIAGNOSIS — I252 Old myocardial infarction: Secondary | ICD-10-CM | POA: Diagnosis not present

## 2020-02-25 DIAGNOSIS — Z7189 Other specified counseling: Secondary | ICD-10-CM

## 2020-02-25 DIAGNOSIS — E1151 Type 2 diabetes mellitus with diabetic peripheral angiopathy without gangrene: Secondary | ICD-10-CM | POA: Diagnosis present

## 2020-02-25 DIAGNOSIS — Z466 Encounter for fitting and adjustment of urinary device: Secondary | ICD-10-CM | POA: Diagnosis not present

## 2020-02-25 DIAGNOSIS — E860 Dehydration: Secondary | ICD-10-CM | POA: Diagnosis present

## 2020-02-25 DIAGNOSIS — N184 Chronic kidney disease, stage 4 (severe): Secondary | ICD-10-CM | POA: Diagnosis present

## 2020-02-25 DIAGNOSIS — R4182 Altered mental status, unspecified: Secondary | ICD-10-CM

## 2020-02-25 DIAGNOSIS — J9811 Atelectasis: Secondary | ICD-10-CM | POA: Diagnosis present

## 2020-02-25 DIAGNOSIS — E785 Hyperlipidemia, unspecified: Secondary | ICD-10-CM | POA: Diagnosis present

## 2020-02-25 DIAGNOSIS — I10 Essential (primary) hypertension: Secondary | ICD-10-CM | POA: Diagnosis not present

## 2020-02-25 DIAGNOSIS — Z978 Presence of other specified devices: Secondary | ICD-10-CM

## 2020-02-25 DIAGNOSIS — Z515 Encounter for palliative care: Secondary | ICD-10-CM

## 2020-02-25 DIAGNOSIS — E1122 Type 2 diabetes mellitus with diabetic chronic kidney disease: Secondary | ICD-10-CM | POA: Diagnosis present

## 2020-02-25 DIAGNOSIS — N179 Acute kidney failure, unspecified: Secondary | ICD-10-CM | POA: Diagnosis present

## 2020-02-25 DIAGNOSIS — I70203 Unspecified atherosclerosis of native arteries of extremities, bilateral legs: Secondary | ICD-10-CM | POA: Diagnosis present

## 2020-02-25 DIAGNOSIS — N390 Urinary tract infection, site not specified: Secondary | ICD-10-CM | POA: Diagnosis not present

## 2020-02-25 DIAGNOSIS — I350 Nonrheumatic aortic (valve) stenosis: Secondary | ICD-10-CM | POA: Diagnosis present

## 2020-02-25 DIAGNOSIS — I5032 Chronic diastolic (congestive) heart failure: Secondary | ICD-10-CM | POA: Diagnosis present

## 2020-02-25 DIAGNOSIS — E039 Hypothyroidism, unspecified: Secondary | ICD-10-CM | POA: Diagnosis present

## 2020-02-25 DIAGNOSIS — I482 Chronic atrial fibrillation, unspecified: Secondary | ICD-10-CM | POA: Diagnosis not present

## 2020-02-25 DIAGNOSIS — I480 Paroxysmal atrial fibrillation: Secondary | ICD-10-CM

## 2020-02-25 DIAGNOSIS — T83518A Infection and inflammatory reaction due to other urinary catheter, initial encounter: Secondary | ICD-10-CM | POA: Diagnosis present

## 2020-02-25 DIAGNOSIS — E1165 Type 2 diabetes mellitus with hyperglycemia: Secondary | ICD-10-CM

## 2020-02-25 DIAGNOSIS — N401 Enlarged prostate with lower urinary tract symptoms: Secondary | ICD-10-CM | POA: Diagnosis present

## 2020-02-25 DIAGNOSIS — L89152 Pressure ulcer of sacral region, stage 2: Secondary | ICD-10-CM | POA: Diagnosis present

## 2020-02-25 DIAGNOSIS — Z20822 Contact with and (suspected) exposure to covid-19: Secondary | ICD-10-CM | POA: Diagnosis present

## 2020-02-25 DIAGNOSIS — Y846 Urinary catheterization as the cause of abnormal reaction of the patient, or of later complication, without mention of misadventure at the time of the procedure: Secondary | ICD-10-CM | POA: Diagnosis present

## 2020-02-25 DIAGNOSIS — E876 Hypokalemia: Secondary | ICD-10-CM | POA: Diagnosis present

## 2020-02-25 DIAGNOSIS — I2510 Atherosclerotic heart disease of native coronary artery without angina pectoris: Secondary | ICD-10-CM | POA: Diagnosis present

## 2020-02-25 DIAGNOSIS — H409 Unspecified glaucoma: Secondary | ICD-10-CM | POA: Diagnosis present

## 2020-02-25 DIAGNOSIS — B965 Pseudomonas (aeruginosa) (mallei) (pseudomallei) as the cause of diseases classified elsewhere: Secondary | ICD-10-CM | POA: Diagnosis present

## 2020-02-25 DIAGNOSIS — I130 Hypertensive heart and chronic kidney disease with heart failure and stage 1 through stage 4 chronic kidney disease, or unspecified chronic kidney disease: Secondary | ICD-10-CM | POA: Diagnosis present

## 2020-02-25 LAB — GLUCOSE, CAPILLARY
Glucose-Capillary: 227 mg/dL — ABNORMAL HIGH (ref 70–99)
Glucose-Capillary: 116 mg/dL — ABNORMAL HIGH (ref 70–99)
Glucose-Capillary: 199 mg/dL — ABNORMAL HIGH (ref 70–99)
Glucose-Capillary: 211 mg/dL — ABNORMAL HIGH (ref 70–99)
Glucose-Capillary: 166 mg/dL — ABNORMAL HIGH (ref 70–99)

## 2020-02-25 LAB — BASIC METABOLIC PANEL
Anion gap: 10 (ref 5–15)
BUN: 35 mg/dL — ABNORMAL HIGH (ref 8–23)
CO2: 25 mmol/L (ref 22–32)
Calcium: 9 mg/dL (ref 8.9–10.3)
Chloride: 101 mmol/L (ref 98–111)
Creatinine, Ser: 2.64 mg/dL — ABNORMAL HIGH (ref 0.61–1.24)
GFR, Estimated: 22 mL/min — ABNORMAL LOW (ref >60)
Glucose, Bld: 132 mg/dL — ABNORMAL HIGH (ref 70–99)
Potassium: 3.4 mmol/L — ABNORMAL LOW (ref 3.5–5.1)
Sodium: 136 mmol/L (ref 135–145)

## 2020-02-25 LAB — HEMOGLOBIN A1C
Hgb A1c MFr Bld: 7.5 % — ABNORMAL HIGH (ref 4.8–5.6)
Mean Plasma Glucose: 168.55 mg/dL

## 2020-02-25 LAB — SARS CORONAVIRUS 2 (TAT 6-24 HRS): SARS Coronavirus 2: NEGATIVE

## 2020-02-25 LAB — VITAMIN B12
Vitamin B-12: 235 pg/mL (ref 180–914)
Vitamin B-12: 269 pg/mL (ref 180–914)

## 2020-02-25 LAB — FOLATE
Folate: 7.5 ng/mL (ref >5.9)
Folate: 8 ng/mL (ref >5.9)

## 2020-02-25 LAB — MAGNESIUM: Magnesium: 2.2 mg/dL (ref 1.7–2.4)

## 2020-02-25 MED ORDER — SILVER SULFADIAZINE 1 % EX CREA
TOPICAL_CREAM | Freq: Every day | CUTANEOUS | Status: DC
  Filled 2020-02-25: qty 85

## 2020-02-25 MED ORDER — APIXABAN 2.5 MG PO TABS
2.5000 mg | ORAL_TABLET | Freq: Two times a day (BID) | ORAL | Status: DC
  Administered 2020-02-25 – 2020-02-27 (×4): 2.5 mg via ORAL
  Filled 2020-02-25 (×4): qty 1

## 2020-02-25 MED ORDER — SODIUM CHLORIDE 0.9 % IV SOLN: INTRAVENOUS | Status: AC

## 2020-02-25 NOTE — Progress Notes (Signed)
EEG Completed; Results Pending  

## 2020-02-25 NOTE — Evaluation (Signed)
Physical Therapy Evaluation Patient Details Name: [name] MRN: [id number] DOB: [birth year] Today's Date: 02/25/2020   History of Present Illness  [name] is a 87 y.o. male with medical history significant for paroxysmal atrial fibrillation, aortic stenosis, diastolic CHF, atrial fibrillation, diabetes mellitus, CKD 3, bilateral lower extremity amputations.  Patient was brought to the ED reports of altered mental status.  Patient's son is at bedside and helps with the history as patient cannot remember most of what happened today.  Patient son reports that home health nurse was with patient, he had just check the wounds on patient's back which were improving and had sat him in a chair, when initially he was complaining of pain in his buttock, and then suddenly he started screaming.  Within 30 minutes of this, patient's was unresponsive, though he was awake and his eyes were open.  No seizure activity witnessed, no history of seizures.  Patient's son [name] reports that patient was not back to normal until when MRI was being done here.  Patient denies any pain, he denies chest pain, no difficulty breathing.  He has a chronic indwelling Foley catheter.  No vomiting, no loose stools appetite has been good.    Clinical Impression  Patient functioning at baseline for functional mobility. Discussed patient's mobility and prior therapy with patient and his son. Patient requires assist for bed mobility secondary to weakness. Patient's son states patient is assisted with all mobility at baseline. Patient would benefit from HHPT in order to to improve strength for transfers and completion of ADL. Patient does not require additional acute PT services at this time.      Follow Up Recommendations Home health PT    Equipment Recommendations  None recommended by PT    Recommendations for Other Services       Precautions / Restrictions Precautions Precautions: Fall Restrictions Weight Bearing  Restrictions: No      Mobility  Bed Mobility Overal bed mobility: Needs Assistance Bed Mobility: Rolling Rolling: Mod assist         General bed mobility comments: Patient requires assist with all mobility at baseline    Transfers                    Ambulation/Gait                Stairs            Wheelchair Mobility    Modified Rankin (Stroke Patients Only)       Balance                                             Pertinent Vitals/Pain Pain Assessment: No/denies pain    Home Living Family/patient expects to be discharged to:: Private residence Living Arrangements: Children Available Help at Discharge: Family;Available 24 hours/day Type of Home: House Home Access: Ramped entrance     Home Layout: One level Home Equipment: Walker - 2 wheels;Bedside commode;Shower seat;Wheelchair - manual;Hospital bed Additional Comments: has steady mechanical lift    Prior Function Level of Independence: Needs assistance   Gait / Transfers Assistance Needed: non-ambulatory, uses steady lift with prosthetics for transfers  ADL's / Homemaking Assistance Needed: assisted by family  Comments: Patient's son provides most of patient's home set up/PLOF     Hand Dominance        Extremity/Trunk Assessment  Upper Extremity Assessment Upper Extremity Assessment: Generalized weakness    Lower Extremity Assessment Lower Extremity Assessment: Generalized weakness       Communication   Communication: HOH  Cognition Arousal/Alertness: Awake/alert Behavior During Therapy: WFL for tasks assessed/performed Overall Cognitive Status: Within Functional Limits for tasks assessed                                        General Comments      Exercises     Assessment/Plan    PT Assessment All further PT needs can be met in the next venue of care  PT Problem List Decreased strength;Decreased mobility;Decreased  activity tolerance;Decreased balance       PT Treatment Interventions      PT Goals (Current goals can be found in the Care Plan section)  Acute Rehab PT Goals Patient Stated Goal: Return home with family to assist PT Goal Formulation: With patient/family Time For Goal Achievement: 02/25/20 Potential to Achieve Goals: Good    Frequency     Barriers to discharge        Co-evaluation               AM-PAC PT "6 Clicks" Mobility  Outcome Measure Help needed turning from your back to your side while in a flat bed without using bedrails?: A Lot Help needed moving from lying on your back to sitting on the side of a flat bed without using bedrails?: A Lot Help needed moving to and from a bed to a chair (including a wheelchair)?: Total Help needed standing up from a chair using your arms (e.g., wheelchair or bedside chair)?: Total Help needed to walk in hospital room?: Total Help needed climbing 3-5 steps with a railing? : Total 6 Click Score: 8    End of Session   Activity Tolerance: Patient tolerated treatment well Patient left: in bed;with family/visitor present;with call bell/phone within reach;with bed alarm set Nurse Communication: Mobility status PT Visit Diagnosis: Other abnormalities of gait and mobility (R26.89);Muscle weakness (generalized) (M62.81)    Time: 2440-1027 PT Time Calculation (min) (ACUTE ONLY): 13 min   Charges:   PT Evaluation $PT Eval Low Complexity: 1 Low         11:57 AM, 02/25/20 Mearl Latin PT, DPT Physical Therapist at [facility]

## 2020-02-25 NOTE — TOC Initial Note (Signed)
Transition of Care [facility]) - Initial/Assessment Note    Patient Details  Name: [name] MRN: [id number] Date of Birth: April 02, 1927  Transition of Care [facility]) CM/SW Contact:    Boneta Lucks, RN Phone Number: 02/25/2020, 1:22 PM  Clinical Narrative:     Patient admitted with AMS, Patient has a high risk for readmission. Patient  Has 24/7 care in the home. Palliative reviewing goals of care with patient and family today. Plan is for patient to return home. Patient is active with [facility]. Has all equipment needed in the home. No new needs identified this hospitalization.              Expected Discharge Plan: Home/Self Care Barriers to Discharge: Continued Medical Work up   Patient Goals and CMS Choice   CMS Medicare.gov Compare Post Acute Care list provided to:: Patient Represenative (must comment) Choice offered to / list presented to : Adult Children  Expected Discharge Plan and Services Expected Discharge Plan: Home/Self Care       Emotional Assessment    Alcohol / Substance Use: Not Applicable Psych Involvement: No (comment)  Admission diagnosis:  Acute UTI [N39.0] Altered mental status, unspecified altered mental status type [R41.82] AMS (altered mental status) [M35.59] Acute metabolic encephalopathy [R41.63] Patient Active Problem List   Diagnosis Date Noted  . Uncontrolled type 2 diabetes mellitus with hyperglycemia, without long-term current use of insulin ([place]) 02/25/2020  . CKD (chronic kidney disease) stage 4, GFR 15-29 ml/min (HCC) 02/25/2020  . Acute UTI   . AMS (altered mental status) 02/24/2020  . PNA (pneumonia) 12/06/2019  . Sepsis due to PNA 12/06/2019  . Acute metabolic encephalopathy 84/53/6468  . AKI (acute kidney injury) ([place])   . Aortic valve stenosis   . Palliative care encounter   . NSTEMI (non-ST elevated myocardial infarction) ([place]) 10/04/2019  . Acute kidney injury superimposed on CKD ([place]) 10/04/2019  . Hypokalemia  10/04/2019  . Weakness generalized 04/05/2019  . S/P BKA (below knee amputation) unilateral, left ([place]) 02/15/2019  . Pressure injury of skin 02/03/2019  . TIA (transient ischemic attack) 02/03/2019  . Transient speech disturbance 02/02/2019  . Chronic indwelling Foley catheter 01/22/2019  . Postoperative wound infection 01/20/2019  . Normocytic anemia 01/18/2019  . Cellulitis 01/18/2019  . Infection of deep incisional surgical site after procedure   . Abnormal urinalysis   . Labile blood glucose   . Urinary retention   . S/P bilateral BKA (below knee amputation) ([place])   . Acute blood loss anemia   . Urinary retention due to benign prostatic hyperplasia 12/28/2018  . Unilateral complete BKA, left, initial encounter ([place]) 12/25/2018  . Dehiscence of amputation stump ([place])   . History of transmetatarsal amputation of left foot ([place]) 12/09/2018  . Critical limb ischemia with history of revascularization of same extremity ([place])   . Gangrene of left foot ([place])   . Diabetic ulcer of toe of left foot associated with type 2 diabetes mellitus ([place])   . Cellulitis of left lower extremity   . Toe infection 11/18/2018  . Peripheral edema   . Acute on chronic diastolic CHF (congestive heart failure) ([place]) 11/09/2018  . S/P BKA (below knee amputation) unilateral, right ([place]) 10/05/2018  . Hyponatremia   . Essential hypertension   . Postoperative pain   . Pain of left heel   . Unable to maintain weight-bearing   . Type 2 diabetes mellitus with diabetic peripheral angiopathy with gangrene ([place])   . Anemia due to acute blood  loss   . Chronic kidney disease, stage 3a   . Acquired absence of right leg below knee ([place]) 09/07/2018  . Gangrene of right foot ([place])   . Goals of care, counseling/discussion   . Palliative care by specialist   . Type 2 diabetes mellitus with vascular disease ([place])   . Severe protein-calorie malnutrition ([place])   . Ischemic foot 08/20/2018  . Critical lower limb  ischemia ([place]) 08/03/2018  . Type 2 diabetes mellitus with foot ulcer, without long-term current use of insulin ([place])   . Gastroesophageal reflux disease   . Cellulitis of great toe of right foot 05/29/2018  . Atrial fibrillation, chronic   . Chest pain 07/25/2017  . PAF (paroxysmal atrial fibrillation) ([place]) 07/25/2017  . BPH (benign prostatic hyperplasia) 07/25/2017  . Shortness of breath 05/11/2017  . Hypothyroidism 05/11/2017  . Chronic diastolic CHF (congestive heart failure) ([place]) 05/11/2017  . Moderate aortic stenosis 06/12/2016  . RBBB 06/12/2016  . Peripheral vascular disease ([place]) 08/04/2012  . Essential hypertension, benign 08/04/2012  . Hyperlipidemia 08/04/2012  . Dizziness 08/04/2012   PCP:  Redmond School, MD Pharmacy:   [facility], [place] [facility] [place] [address] [place] [address] Phone: [phone] Fax: [phone]   Readmission Risk Interventions Readmission Risk Prevention Plan 02/25/2020 12/13/2019 12/08/2019  Transportation Screening Complete - Complete  PCP or Specialist Appt within 3-5 Days - - -  Not Complete comments - - -  [place] or Home Care Consult Complete - -  [place] or Home Care Consult comments - - -  Social Work Consult for [place] Planning/Counseling Complete - -  Palliative Care Screening Complete - -  Medication Review Press photographer) Complete - Complete  PCP or Specialist appointment within 3-5 days of discharge - Complete -  [place] or [place] - - Complete  [place] - - Not Applicable  Some recent data might be hidden

## 2020-02-25 NOTE — Consult Note (Addendum)
[facility] Nurse Consult Note: Reason for Consult: Consult requested for sacrum wound.  Performed remotely after review of photo and progress notes in the EMR. Pt's wound has been present since January and he has been receiving Silvadene dressings with assistance and ongoing assessments from home health nurses; the wound has greatly improved, according to the patient's son and the home health nurse.  Wound type: Wound is located more on the inner buttocks, instead of sacrum. Description is consistent with a previous Stage 3 pressure injury which is healing; approx 90% yellow, 10% red Pressure Injury POA: Yes Wound bed: yellow and moist Drainage (amount, consistency, odor) some yellow drainage noted Periwound: Red and macerated scattered areas to buttocks; appearance is consistent with moisture associated skin damage.  Dressing procedure/placement/frequency: Continue previous plan of care, since this has contributed towards wound healing, according to son and home health nurse. Topical treatment orders provided for bedside nurses to perform as follows: Apply Silvadene to buttock/sacrum wound Q day, then cover with gauze and foam dressing.  (Change foam dressing Q 3 days or PRN soiling.) Please re-consult if further assistance is needed.  Thank-you,  Julien Girt MSN, [place], [place], [place], [place]

## 2020-02-25 NOTE — Procedures (Signed)
Patient Name: [name]  MRN: [id number]  Epilepsy Attending: Lora Havens  Referring Physician/Provider: Dr Shanon Brow Tat Date: 02/25/2020 Duration: 24.01 mins  Patient history: 87yo M with ams. EEG to evaluate for seizur  Level of alertness: Awake  AEDs during EEG study: None  Technical aspects: This EEG study was done with scalp electrodes positioned according to the 10-20 International system of electrode placement. Electrical activity was acquired at a sampling rate of 500Hz  and reviewed with a high frequency filter of 70Hz  and a low frequency filter of 1Hz . EEG data were recorded continuously and digitally stored.   Description: No posterior dominant rhythm was seen. EEG showed continuous generalized 5 to 6 Hz theta as well as intermittent 2-3Hz  delta slowing. Hyperventilation and photic stimulation were not performed.     ABNORMALITY -Continuous slow, generalized  IMPRESSION: This study is suggestive of moderate diffuse encephalopathy, nonspecific etiology. No seizures or epileptiform discharges were seen throughout the recording.   Gayle Collard Barbra Sarks

## 2020-02-25 NOTE — Progress Notes (Signed)
PROGRESS NOTE  [name] BDZ:[id number] DOB: 1927-10-13 DOA: 02/24/2020 PCP: Redmond School, MD  Brief History:  87 year old male with a history of coronary artery disease, NSTEMI, bilateral BKA, TIA, chronic indwelling Foley catheter, hypertension, hyperlipidemia, hypothyroidism, diabetes mellitus, CKD stage IV, paroxysmal atrial fibrillation, diastolic CHF, and moderate aortic stenosis presenting with altered mental status. The patient has had 2 recent hospitalizations, most recently 12/06/2019 to 12/13/2019 when he was treated for sepsis and pneumonia.  He also had a UTI at that time with Pseudomonas and Enterococcus faecalis.  In addition, the patient also was admitted from 10/04/2019 to 10/12/2019 secondary to NSTEMI.  He was also noted to have acute on chronic renal failure and fluid overload and UTI at that time. At baseline, the patient does not ambulate.  He usually sits in the recliner or wheelchair all day.  He needs assistance with all his activities of daily living.  He is usually alert and oriented x3.  Son denies any new medications.  There are no reports of fever, chills, headache, chest pain, shortness breath, coughing, hemoptysis, nausea, vomiting or diarrhea, abdominal pain.   Patient is a poor historian.  History is obtained from review of the medical record and speaking with the patient's son who is the primary caregiver.  Apparently, the patient had been in his usual state of health until 02/24/2020.  Apparently, the patient had just been placed in a recliner after getting back from the bathroom.  Apparently, the patient began screaming in pain for about 2 minutes.  Subsequently, his son noticed him to be less responsive about 5 minutes after the screaming episode.  His son checked up on him and noted the patient was just staring into space and not responding appropriately.  The patient was awake, but did not follow any commands or phonate.  The family subsequently then  brought the patient to the emergency department for further evaluation.  The patient had remained awake during the transportation to the emergency department, and just kept staring into space without responding appropriately.  The entire episode lasted about 4 hours.  Prior to taking the patient to the MRI of the brain the patient began speaking and became more alert.  He had not returned to baseline, but was improving.  Work-up in the emergency department revealed that the patient had pyuria with a UA >50 WBC.  WBC 9.7, hemoglobin 13.5, platelets 242,000.  BMP showed a serum creatinine of 2.53 which is near his baseline.  Sodium 135.  MRI of the brain was negative for acute infarct or other acute changes.  Chest x-ray showed chronic stable changes no right pleural effusion.  Lactic acid peaked at 1.6.  Patient was started on ceftazidime and admitted for further evaluation.  Assessment/Plan: Acute metabolic encephalopathy -Secondary to UTI dehydration -CT and MRI brain negative for acute findings -UA >50 WBC -Continue IV ceftazidime pending culture data -Continue IV fluids -TSH 2.659 -Obtain EEG -Serum T41 -Folic acid -remains confused presently  CAUTI -Patient has chronic indwelling Foley catheter -Last changed on 02/08/2019 -Continue ceftazidime pending culture data  Coronary artery disease with history of NSTEMI -No chest pain presently -Personally reviewed EKG--sinus rhythm, nonspecific T wave change -Personally reviewed chest x-ray--interstitial markings, right pleural effusion -Continue Plavix -Continue Lipitor  Chronic diastolic CHF -Appears clinically euvolemic--actually a bit on the dry side -Judicious IV fluids -08/26/2019 echo EF 55-60%, no WMA, grade 1 DD, moderate to severe aortic stenosis -Continue carvedilol  CKD stage IV -Baseline creatinine 2.2-2.5 -A.m. BMP  Uncontrolled diabetes mellitus type 2 with hyperglycemia -Discontinue glipizide -NovoLog sliding  scale -Hemoglobin A1c -10/05/2019 hemoglobin A1c 8.5  Paroxysmal atrial fibrillation -Rate controlled -Continue apixaban  Hypothyroidism -Continue Synthroid  Urinary retention -Patient has chronic indwelling Foley catheter -Continue Flomax and finasteride  Essential hypertension -Continue carvedilol and amlodipine  Hyperlipidemia -Continue statin  Bilateral BKA -PT evaluation  Stage 2 sacral decubitus -present on admission -not infected on exam -consult wound care  Goals of Care -full scope of care presently -palliative medicine consult       Status is: Observation  The patient will require care spanning > 2 midnights and should be moved to inpatient because: Altered mental status  Dispo: The patient is from: Home              Anticipated d/c is to: Home              Anticipated d/c date is: 1 day              Patient currently is not medically stable to d/c.   Difficult to place patient No        Family Communication:   Son updated 2/11 Consultants:  palliative  Code Status:  FULL   DVT Prophylaxis:  apixaban   Procedures: As Listed in Progress Note Above  Antibiotics: None  RN Pressure Injury Documentation: Pressure Injury 02/03/19 Buttocks Right Stage 1 -  Intact skin with non-blanchable redness of a localized area usually over a bony prominence. (Active)  02/03/19 0020  Location: Buttocks  Location Orientation: Right  Staging: Stage 1 -  Intact skin with non-blanchable redness of a localized area usually over a bony prominence.  Wound Description (Comments):   Present on Admission: Yes     Pressure Injury 02/03/19 Buttocks Left Stage 2 -  Partial thickness loss of dermis presenting as a shallow open injury with a red, pink wound bed without slough. (Active)  02/03/19 1707  Location: Buttocks  Location Orientation: Left  Staging: Stage 2 -  Partial thickness loss of dermis presenting as a shallow open injury with a red, pink wound bed  without slough.  Wound Description (Comments):   Present on Admission: Yes        Subjective: Patient denies fevers, chills, headache, chest pain, dyspnea, nausea, vomiting, diarrhea, abdominal pain,   Objective: Vitals:   02/24/20 2010 02/24/20 2059 02/25/20 0221 02/25/20 0500  BP: 136/74 (!) 144/58 (!) 143/60 (!) 163/67  Pulse: (!) 55 (!) 53 (!) 57 (!) 56  Resp: 15 10    Temp:  (!) 97.5 F (36.4 C) 97.8 F (36.6 C) 97.9 F (36.6 C)  TempSrc:  Oral Oral Oral  SpO2: 95% 97% 97% 100%    Intake/Output Summary (Last 24 hours) at 02/25/2020 0746 Last data filed at 02/25/2020 0300 Gross per 24 hour  Intake 700 ml  Output 1100 ml  Net -400 ml   Weight change:  Exam:   General:  Pt is alert, follows commands appropriately, not in acute distress  HEENT: No icterus, No thrush, No neck mass, [place]/AT  Cardiovascular: RRR, S1/S2, no rubs, no gallops  Respiratory:bibasilar crackles. No wheeze  Abdomen: Soft/+BS, non tender, non distended, no guarding  Extremities: bilateral BKA. No edema, No lymphangitis, No petechiae, No rashes, no synovitis   Data Reviewed: I have personally reviewed following labs and imaging studies Basic Metabolic Panel: Recent Labs  Lab 02/24/20 1420  NA 135  K 3.7  CL 97*  CO2 26  GLUCOSE 224*  BUN 35*  CREATININE 2.53*  CALCIUM 9.4   Liver Function Tests: Recent Labs  Lab 02/24/20 1420  AST 25  ALT 21  ALKPHOS 46  BILITOT 1.0  PROT 7.7  ALBUMIN 3.7   No results for input(s): LIPASE, AMYLASE in the last 168 hours. No results for input(s): AMMONIA in the last 168 hours. Coagulation Profile: No results for input(s): INR, PROTIME in the last 168 hours. CBC: Recent Labs  Lab 02/24/20 1420  WBC 9.7  NEUTROABS 5.9  HGB 13.5  HCT 40.9  MCV 97.8  PLT 242   Cardiac Enzymes: No results for input(s): CKTOTAL, CKMB, CKMBINDEX, TROPONINI in the last 168 hours. BNP: Invalid input(s): POCBNP CBG: Recent Labs  Lab 02/24/20 2358   GLUCAP 171*   HbA1C: No results for input(s): HGBA1C in the last 72 hours. Urine analysis:    Component Value Date/Time   COLORURINE YELLOW 02/24/2020 1452   APPEARANCEUR TURBID (A) 02/24/2020 1452   LABSPEC 1.010 02/24/2020 1452   PHURINE 7.0 02/24/2020 1452   GLUCOSEU NEGATIVE 02/24/2020 1452   HGBUR SMALL (A) 02/24/2020 1452   BILIRUBINUR NEGATIVE 02/24/2020 1452   KETONESUR NEGATIVE 02/24/2020 1452   PROTEINUR 100 (A) 02/24/2020 1452   NITRITE NEGATIVE 02/24/2020 1452   LEUKOCYTESUR LARGE (A) 02/24/2020 1452   Sepsis Labs: @LABRCNTIP (procalcitonin:4,lacticidven:4) )No results found for this or any previous visit (from the past 240 hour(s)).   Scheduled Meds: . apixaban  2.5 mg Oral Daily  . atorvastatin  40 mg Oral QPM  . carvedilol  6.25 mg Oral BID WC  . clopidogrel  75 mg Oral Q breakfast  . famotidine  20 mg Oral Daily  . glipiZIDE  5 mg Oral BID WC  . insulin aspart  0-5 Units Subcutaneous QHS  . insulin aspart  0-9 Units Subcutaneous TID WC  . potassium chloride SA  20 mEq Oral BID   Continuous Infusions: . cefTAZidime (FORTAZ)  IV Stopped (02/25/20 0030)    Procedures/Studies: CT Head Wo Contrast  Result Date: 02/24/2020 CLINICAL DATA:  Mental status change EXAM: CT HEAD WITHOUT CONTRAST TECHNIQUE: Contiguous axial images were obtained from the base of the skull through the vertex without intravenous contrast. COMPARISON:  CT head 12/06/2019 FINDINGS: Brain: Generalized atrophy. Mild white matter hypodensity bilaterally appears chronic. Negative for acute infarct, hemorrhage, mass Vascular: Negative for hyperdense vessel Skull: Negative calvarium. Sinuses/Orbits: Paranasal sinuses clear. Bilateral cataract extraction Other: None IMPRESSION: No acute abnormality No interval change. Atrophy and chronic microvascular ischemic change in the white matter. Electronically Signed   By: Franchot Gallo M.D.   On: 02/24/2020 14:42   MR BRAIN WO CONTRAST  Result Date:  02/24/2020 CLINICAL DATA:  Mental status change EXAM: MRI HEAD WITHOUT CONTRAST TECHNIQUE: Multiplanar, multiecho pulse sequences of the brain and surrounding structures were obtained without intravenous contrast. COMPARISON:  January 2021 FINDINGS: Motion artifact is present on some sequences. Axial T1 and coronal T2 sequences were not obtained. Axial T2 FLAIR is particularly degraded. Brain: There is no acute infarction or intracranial hemorrhage. There is no intracranial mass, mass effect, or edema. There is no hydrocephalus. There is diffuse pachymeningeal thickening versus thin subdural collections bilaterally along the convexities and the falx without mass effect. In retrospect, likely present on prior study. Prominence of the ventricles and sulci reflects generalized parenchymal volume loss. Patchy T2 hyperintensity in the supratentorial white matter is nonspecific but may reflect chronic microvascular ischemic changes. Vascular: Major vessel flow  voids at the skull base are preserved. Skull and upper cervical spine: Marrow signal is within normal limits. Sinuses/Orbits: Minor mucosal thickening. Bilateral lens replacements. Other: Sella is unremarkable.  Mastoid air cells are clear. IMPRESSION: Partially motion degraded study. No evidence of recent infarction, hemorrhage, or mass. Suspected diffuse pachymeningeal thickening versus thin subdural collections along the bilateral cerebral convexities and falx without mass effect. Likely present on 2021 examination. Chronic microvascular ischemic changes. Electronically Signed   By: Macy Mis M.D.   On: 02/24/2020 16:44   DG Chest Port 1 View  Result Date: 02/24/2020 CLINICAL DATA:  Altered mental status EXAM: PORTABLE CHEST 1 VIEW COMPARISON:  12/08/2019 FINDINGS: Cardiac shadow is stable. Aortic calcifications are seen. Right lung is clear. Left lung demonstrates evidence of moderate effusion inline in 1 atelectatic changes. No bony abnormality is  seen. IMPRESSION: Right-sided effusion and atelectasis increased from the prior exam. Electronically Signed   By: Inez Catalina M.D.   On: 02/24/2020 15:06    Orson Eva, DO  [facility]  If 7PM-7AM, please contact night-coverage [email or web address] Password [facility] 02/25/2020, 7:46 AM   LOS: 0 days

## 2020-02-25 NOTE — Consult Note (Signed)
Consultation Note Date: 02/25/2020   Patient Name: [name]  DOB: 1927/11/22  MRN: [id number]  Age / Sex: 87 y.o., male  PCP: Redmond School, MD Referring Physician: Orson Eva, MD  Reason for Consultation: Establishing goals of care and Psychosocial/spiritual support  HPI/Patient Profile: 87 y.o. male  with past medical history of peripheral vascular disease with bilateral lower extremity amputation, MI, HTN/HLD, DM, CKD, CHF, urinary retention 2/2 BPH with chronic indwelling Foley catheter, sleep apnea, hypothyroidism admitted on 02/24/2020 with dehydration/UTI.   Clinical Assessment and Goals of Care: I have reviewed medical records including EPIC notes, labs and imaging, received report from attending, examined the patient and met at bedside with son [name] to discuss diagnosis prognosis, GOC, EOL wishes, disposition and options.  I introduced Palliative Medicine as specialized medical care for people living with serious illness. It focuses on providing relief from the symptoms and stress of a serious illness.  The rest family is active with [facility] outpatient palliative services.  As far as functional and nutritional status, [name] is assisted with his ADLs.  He is assisted at home by 24/7 caregivers.  He uses a sit to stand device, but when asked if he is able to walk, running shows a video that is 11 months old.  I state things are looking different for him now.  We talked about 3 hospital visits in the last 6 months, MI in September.  We discussed current illness and what it means in the larger context of on-going co-morbidities.  Natural disease trajectory and expectations at EOL were discussed.  We talked about the chronic illness pathway, what is normal and expected.  We talked about finding a balance for treating [name], but some of the treatments can be difficult such as drying someone out  with Lasix.  [name] shares a story of [name] father and his debility after having a stroke.  He shares that his grandfather [place], surrounded by family caring for him.  He shares that he [name] father was hemiplegic, but lived 6 years.  I asked [name] to tell me what [name] father's passing looks like.  He shared that he believed it was from heart failure.  We talked about the treatment plan including, but not limited to, UTI, chronic indwelling Foley, IV fluids, dehydration, chest x-ray results, mobility, head CT results.  We talked about disposition.  The rest family would like for [name] to return to his own home where he has 24/7 care.  We also talked about [name] sacral decubitus, which [name] states has improved over the last 2 weeks.  Advanced directives, concepts specific to code status, were considered and discussed.  We talked about the concept of "treat the treatable, but allowing natural passing.  I encourage running to talk with the family about life support, if [name] is intubated/ventilated, for how long?  2 days, 2 weeks, 2 months?  [name] is active with [facility] outpatient palliative services.   Questions  and concerns were addressed.  The family was encouraged to call with questions or concerns.   Conference with attending, bedside nursing staff, transition of care team related to patient condition, needs, goals of care.    HCPOA   NEXT OF KIN -adult children make choices as a team.  Running, [place], and [place].      SUMMARY OF RECOMMENDATIONS   At this point full scope/full code Active with [facility] outpatient palliative Home, home health if needed   Code Status/Advance Care Planning:  Full code  Symptom Management:   Per hospitalist, no additional needs at this time.  Palliative Prophylaxis:   Palliative Wound Care and Turn Reposition  Additional Recommendations (Limitations, Scope, Preferences):  Full Scope  Treatment  Psycho-social/Spiritual:   Desire for further Chaplaincy support:no  Additional Recommendations: Caregiving  Support/Resources and Education on Hospice  Prognosis:   Unable to determine, based on outcomes.  6 months or less would not be surprising based on functional status, 3 hospital stays in the last 6 months, chronic illness burden.  Discharge Planning: Home with 24/7 caregivers, home health if needed      Primary Diagnoses: Present on Admission: . AMS (altered mental status) . Urinary retention due to benign prostatic hyperplasia . Essential hypertension, benign . Chronic diastolic CHF (congestive heart failure) (HCC) . Type 2 diabetes mellitus with vascular disease (HCC) . Essential hypertension . Atrial fibrillation, chronic . PAF (paroxysmal atrial fibrillation) (HCC) . RBBB . Acute metabolic encephalopathy   I have reviewed the medical record, interviewed the patient and family, and examined the patient. The following aspects are pertinent.  Past Medical History:  Diagnosis Date  . Arthritis   . Borderline diabetic   . CHF (congestive heart failure) (HCC)   . Chronic kidney disease   . Diabetes mellitus without complication (HCC)   . Dysrhythmia   . Glaucoma   . Hyperlipidemia   . Hypertension   . Hypothyroidism   . Myocardial infarction (HCC)   . PVD (peripheral vascular disease) (HCC)   . Sleep apnea    Cpap ordered but doesnt use  . Urinary retention due to benign prostatic hyperplasia 12/28/2018   Social History   Socioeconomic History  . Marital status: Widowed    Spouse name: Not on file  . Number of children: 3  . Years of education: 77 th  . Highest education level: Not on file  Occupational History  . Occupation: Retired Visual merchandiser  Tobacco Use  . Smoking status: Former Smoker    Quit date: 01/13/1965    Years since quitting: 55.1  . Smokeless tobacco: Former Neurosurgeon    Types: Engineer, drilling  . Vaping Use: Never used   Substance and Sexual Activity  . Alcohol use: No  . Drug use: No  . Sexual activity: Not Currently  Other Topics Concern  . Not on file  Social History Narrative    Has 2 children! Very supportive.    Social Determinants of Health   Financial Resource Strain: Not on file  Food Insecurity: No Food Insecurity  . Worried About Programme researcher, broadcasting/film/video in the Last Year: Never true  . Ran Out of Food in the Last Year: Never true  Transportation Needs: No Transportation Needs  . Lack of Transportation (Medical): No  . Lack of Transportation (Non-Medical): No  Physical Activity: Not on file  Stress: Not on file  Social Connections: Not on file   Family History  Problem Relation Age of Onset  .  Cancer Mother   . Heart attack Father   . Stroke Father   . Heart attack Brother   . Heart attack Brother    Scheduled Meds: . apixaban  2.5 mg Oral Daily  . atorvastatin  40 mg Oral QPM  . carvedilol  6.25 mg Oral BID WC  . clopidogrel  75 mg Oral Q breakfast  . famotidine  20 mg Oral Daily  . insulin aspart  0-5 Units Subcutaneous QHS  . insulin aspart  0-9 Units Subcutaneous TID WC   Continuous Infusions: . sodium chloride    . cefTAZidime (FORTAZ)  IV Stopped (02/25/20 0030)   PRN Meds:.acetaminophen **OR** acetaminophen, ondansetron **OR** ondansetron (ZOFRAN) IV, polyethylene glycol Medications Prior to Admission:  Prior to Admission medications   Medication Sig Start Date End Date Taking? Authorizing Provider  acetaminophen (TYLENOL) 500 MG tablet Take 500 mg by mouth every 6 (six) hours as needed for headache (pain).   Yes [provider]  apixaban (ELIQUIS) 5 MG TABS tablet Take 2.5 mg by mouth daily.   Yes [provider]  Ascorbic Acid (VITAMIN C) 1000 MG tablet Take 1,000 mg by mouth daily.    Yes [provider]  atorvastatin (LIPITOR) 40 MG tablet Take 1 tablet (40 mg total) by mouth every evening. 10/25/19  Yes Barrett, Evelene Croon, PA-C   betamethasone dipropionate 0.05 % cream Apply 1 application topically 2 (two) times daily. 12/03/19  Yes [provider]  carvedilol (COREG) 6.25 MG tablet Take 1 tablet (6.25 mg total) by mouth 2 (two) times daily with a meal. 12/13/19  Yes Darrick Meigs, Marge Duncans, MD  Cholecalciferol (VITAMIN D-3) 125 MCG (5000 UT) TABS Take 5,000 Units by mouth every evening.   Yes [provider]  clopidogrel (PLAVIX) 75 MG tablet Take 1 tablet (75 mg total) by mouth daily with breakfast. 12/03/19  Yes Hilty, Nadean Corwin, MD  co-enzyme Q-10 30 MG capsule Take 100 mg by mouth daily.   Yes [provider]  Cranberry-Vitamin C (AZO CRANBERRY URINARY TRACT PO) Take 1 tablet by mouth daily.   Yes [provider]  famotidine (PEPCID) 20 MG tablet Take 1 tablet (20 mg total) by mouth daily. 11/12/18  Yes Johnson, Clanford L, MD  ferrous sulfate 325 (65 FE) MG tablet Take 325 mg by mouth 2 (two) times daily with a meal.   Yes [provider]  furosemide (LASIX) 20 MG tablet Take 1 tablet (20 mg total) by mouth daily. 12/13/19 12/12/20 Yes Lama, Marge Duncans, MD  glipiZIDE (GLUCOTROL) 5 MG tablet Take 5 mg by mouth 2 (two) times daily.    Yes [provider]  latanoprost (XALATAN) 0.005 % ophthalmic solution Place 1 drop into both eyes at bedtime. 09/30/19  Yes [provider]  levothyroxine (SYNTHROID) 75 MCG tablet Take 75 mcg by mouth daily before breakfast.  02/12/19  Yes [provider]  polyethylene glycol (MIRALAX) 17 g packet Take 17 g by mouth daily as needed for moderate constipation. 12/09/18  Yes Mercy Riding, MD  potassium chloride SA (KLOR-CON) 20 MEQ tablet Take 20 mEq by mouth 2 (two) times daily. 01/28/20 03/28/20 Yes [provider]  pyridoxine (B-6) 100 MG tablet Take 100 mg by mouth daily.   Yes [provider]  Skin Protectants, Misc. [facility] SKIN PROTECTANT EX) Apply 1 application topically daily.   Yes [provider]  traMADol (ULTRAM) 50 MG tablet Take 1 tablet (50 mg total) by mouth every 6 (six)  hours as needed. 02/15/19  Yes Lovorn, Megan, MD  vitamin E 400 UNIT capsule Take 400 Units by mouth daily.   Yes [provider]  zinc gluconate 50 MG tablet Take 50 mg by mouth daily.   Yes [provider]  amLODipine (NORVASC) 10 MG tablet Take 1 tablet (10 mg total) by mouth daily. 11/16/19 02/14/20  Pixie Casino, MD  dorzolamide-timolol (COSOPT) 22.3-6.8 MG/ML ophthalmic solution Place 1 drop into both eyes 2 (two) times daily. Patient not taking: No sig reported    [provider]  finasteride (PROSCAR) 5 MG tablet Take 1 tablet (5 mg total) by mouth every evening. Patient not taking: No sig reported 09/22/18   Angiulli, Lavon Paganini, PA-C  hydrOXYzine (ATARAX/VISTARIL) 25 MG tablet Take 25 mg by mouth 4 (four) times daily. Patient not taking: No sig reported 11/30/19   [provider]  Misc Natural Products ([place] MSM FORMULA PO) Take 1 tablet by mouth 2 (two) times daily. Patient not taking: No sig reported    [provider]  ONETOUCH ULTRA test strip 1 each 2 (two) times daily. 04/15/19   [provider]  tamsulosin (FLOMAX) 0.4 MG CAPS capsule Take 0.4 mg by mouth daily. Patient not taking: No sig reported 11/13/19   [provider]   Allergies  Allergen Reactions  . Iodinated Diagnostic Agents Other (See Comments)    caused Fever  . Dye Fdc Red [Red Dye] Hives    Tolerated red Docusate, Niferex   Review of Systems  Unable to perform ROS: Age    Physical Exam Vitals and nursing note reviewed.  Constitutional:      General: He is not in acute distress.    Appearance: Normal appearance. He is not toxic-appearing.  HENT:     Head: Normocephalic and atraumatic.  Cardiovascular:     Rate and Rhythm: Normal rate.  Pulmonary:     Effort: Pulmonary effort is normal. No respiratory distress.  Abdominal:     General: Abdomen  is flat. There is no distension.     Tenderness: There is no guarding.  Musculoskeletal:     Comments: Bilateral amputee  Skin:    General: Skin is warm and dry.  Neurological:     Comments: Oriented to person and place, did not ask time  Psychiatric:     Comments: Calm and cooperative     Vital Signs: BP (!) 163/67 (BP Location: Right Arm)   Pulse (!) 56   Temp 97.9 F (36.6 C) (Oral)   Resp 10   SpO2 100%  Pain Scale: 0-10   Pain Score: 0-No pain   SpO2: SpO2: 100 % O2 Device:SpO2: 100 % O2 Flow Rate: .   IO: Intake/output summary:   Intake/Output Summary (Last 24 hours) at 02/25/2020 0820 Last data filed at 02/25/2020 0300 Gross per 24 hour  Intake 700 ml  Output 1100 ml  Net -400 ml    LBM:   Baseline Weight:   Most recent weight:       Palliative Assessment/Data:   Flowsheet Rows   Flowsheet Row Most Recent Value  Intake Tab   Referral Department Hospitalist  Unit at Time of Referral Med/Surg Unit  Palliative Care Primary Diagnosis Nephrology  Date Notified 02/25/20  Palliative Care Type Return patient Palliative Care  Reason for referral Clarify Goals of Care  Date of Admission 02/24/20  Date first seen by Palliative Care 02/25/20  # of days Palliative referral response time 0 Day(s)  #  of days IP prior to Palliative referral 1  Clinical Assessment   Palliative Performance Scale Score 30%  Pain Max last 24 hours Not able to report  Pain Min Last 24 hours Not able to report  Dyspnea Max Last 24 Hours Not able to report  Dyspnea Min Last 24 hours Not able to report  Psychosocial & Spiritual Assessment   Palliative Care Outcomes       Time In: 1050 Time Out: 1200 Time Total: 70 minutes  Greater than 50%  of this time was spent counseling and coordinating care related to the above assessment and plan.  Signed by: Drue Novel, NP   Please contact Palliative Medicine Team phone at [phone] for questions and concerns.  For individual  provider: See Shea Evans

## 2020-02-26 DIAGNOSIS — N390 Urinary tract infection, site not specified: Secondary | ICD-10-CM | POA: Diagnosis not present

## 2020-02-26 DIAGNOSIS — I482 Chronic atrial fibrillation, unspecified: Secondary | ICD-10-CM | POA: Diagnosis not present

## 2020-02-26 DIAGNOSIS — I5032 Chronic diastolic (congestive) heart failure: Secondary | ICD-10-CM | POA: Diagnosis not present

## 2020-02-26 DIAGNOSIS — N184 Chronic kidney disease, stage 4 (severe): Secondary | ICD-10-CM | POA: Diagnosis not present

## 2020-02-26 LAB — BASIC METABOLIC PANEL
Anion gap: 9 (ref 5–15)
BUN: 34 mg/dL — ABNORMAL HIGH (ref 8–23)
CO2: 23 mmol/L (ref 22–32)
Calcium: 8.5 mg/dL — ABNORMAL LOW (ref 8.9–10.3)
Chloride: 105 mmol/L (ref 98–111)
Creatinine, Ser: 2.54 mg/dL — ABNORMAL HIGH (ref 0.61–1.24)
GFR, Estimated: 23 mL/min — ABNORMAL LOW (ref >60)
Glucose, Bld: 152 mg/dL — ABNORMAL HIGH (ref 70–99)
Potassium: 3.4 mmol/L — ABNORMAL LOW (ref 3.5–5.1)
Sodium: 137 mmol/L (ref 135–145)

## 2020-02-26 LAB — GLUCOSE, CAPILLARY
Glucose-Capillary: 137 mg/dL — ABNORMAL HIGH (ref 70–99)
Glucose-Capillary: 170 mg/dL — ABNORMAL HIGH (ref 70–99)
Glucose-Capillary: 154 mg/dL — ABNORMAL HIGH (ref 70–99)
Glucose-Capillary: 176 mg/dL — ABNORMAL HIGH (ref 70–99)

## 2020-02-26 MED ORDER — CHLORHEXIDINE GLUCONATE CLOTH 2 % EX PADS
6.0000 | MEDICATED_PAD | Freq: Every day | CUTANEOUS | Status: DC
  Administered 2020-02-26 – 2020-02-27 (×2): 6 via TOPICAL

## 2020-02-26 MED ORDER — POTASSIUM CHLORIDE IN NACL 20-0.9 MEQ/L-% IV SOLN: INTRAVENOUS | Status: AC

## 2020-02-26 MED ORDER — POLYETHYLENE GLYCOL 3350 17 G PO PACK
17.0000 g | PACK | Freq: Once | ORAL | Status: AC
  Administered 2020-02-26: 17 g via ORAL
  Filled 2020-02-26: qty 1

## 2020-02-26 MED ORDER — SENNA 8.6 MG PO TABS
1.0000 | ORAL_TABLET | Freq: Once | ORAL | Status: AC
  Administered 2020-02-26: 8.6 mg via ORAL
  Filled 2020-02-26: qty 1

## 2020-02-26 NOTE — Progress Notes (Signed)
PROGRESS NOTE  [name] PXT:[id number] DOB: 1927/09/15 DOA: 02/24/2020 PCP: [employer] [employer], MD  Brief History:  87 year old male with a history of coronary artery disease, NSTEMI, bilateral BKA, TIA, chronic indwelling Foley catheter, hypertension, hyperlipidemia, hypothyroidism, diabetes mellitus, CKD stage IV, paroxysmal atrial fibrillation, diastolic CHF, and moderate aortic stenosis presenting with altered mental status. The patient has had 2 recent hospitalizations, most recently 12/06/2019 to 12/13/2019 when he was treated for sepsis and pneumonia.  He also had a UTI at that time with Pseudomonas and Enterococcus faecalis.  In addition, the patient also was admitted from 10/04/2019 to 10/12/2019 secondary to NSTEMI.  He was also noted to have acute on chronic renal failure and fluid overload and UTI at that time. At baseline, the patient does not ambulate.  He usually sits in the recliner or wheelchair all day.  He needs assistance with all his activities of daily living.  He is usually alert and oriented x3.  Son denies any new medications.  There are no reports of fever, chills, headache, chest pain, shortness breath, coughing, hemoptysis, nausea, vomiting or diarrhea, abdominal pain.   Patient is a poor historian.  History is obtained from review of the medical record and speaking with the patient's son who is the primary caregiver.  Apparently, the patient had been in his usual state of health until 02/24/2020.  Apparently, the patient had just been placed in a recliner after getting back from the bathroom.  Apparently, the patient began screaming in pain for about 2 minutes.  Subsequently, his son noticed him to be less responsive about 5 minutes after the screaming episode.  His son checked up on him and noted the patient was just staring into space and not responding appropriately.  The patient was awake, but did not follow any commands or phonate.  The family subsequently then  brought the patient to the emergency department for further evaluation.  The patient had remained awake during the transportation to the emergency department, and just kept staring into space without responding appropriately.  The entire episode lasted about 4 hours.  Prior to taking the patient to the MRI of the brain the patient began speaking and became more alert.  He had not returned to baseline, but was improving.  Work-up in the emergency department revealed that the patient had pyuria with a UA >50 WBC.  WBC 9.7, hemoglobin 13.5, platelets 242,000.  BMP showed a serum creatinine of 2.53 which is near his baseline.  Sodium 135.  MRI of the brain was negative for acute infarct or other acute changes.  Chest x-ray showed chronic stable changes no right pleural effusion.  Lactic acid peaked at 1.6.  Patient was started on ceftazidime and admitted for further evaluation.  Assessment/Plan: Acute metabolic encephalopathy -Secondary to UTI dehydration -CT and MRI brain negative for acute findings -UA >50 WBC -Continue IV ceftazidime pending culture data -Continue IV fluids -TSH 2.659 -Obtain EEG--mod diffuseencephalopathy -Serum O27--035 -Folic KKXF--8.1 -overall improving with abx and IVF  CAUTI -Patient has chronic indwelling Foley catheter -Last changed on 02/08/2019 -Continue ceftazidime pending culture data  Coronary artery disease with history of NSTEMI -No chest pain presently -Personally reviewed EKG--sinus rhythm, nonspecific T wave change -Personally reviewed chest x-ray--interstitial markings, right pleural effusion -Continue Plavix -Continue Lipitor  Chronic diastolic CHF -Appears clinically euvolemic--actually a bit on the dry side -Judicious IV fluids -08/26/2019 echo EF 55-60%, no WMA, grade 1 DD, moderate to severe  aortic stenosis -Continue carvedilol  CKD stage IV -Baseline creatinine 2.2-2.5 -A.m. BMP  Uncontrolled diabetes mellitus type 2 with  hyperglycemia -Discontinue glipizide -NovoLog sliding scale -02/25/20 Hemoglobin A1c--7.5  Paroxysmal atrial fibrillation -Rate controlled -Continue apixaban  Hypothyroidism -Continue Synthroid  Urinary retention -Patient has chronic indwelling Foley catheter -Continue Flomax and finasteride  Essential hypertension -Continue carvedilol and amlodipine  Hyperlipidemia -Continue statin  Bilateral BKA -PT evaluation-->HHPT  Stage 2 sacral decubitus -present on admission -not infected on exam -consult wound care  Hypokalemia -add KCl to IVF -mag 2-2  Goals of Care -full scope of care presently -palliative medicine consult       Status is: Observation  The patient will require care spanning > 2 midnights and should be moved to inpatient because: Altered mental status  Dispo: The patient is from: Home  Anticipated d/c is to: Home  Anticipated d/c date is: 02/27/20  Patient currently is not medically stable to d/c.              Difficult to place patient No        Family Communication:   Son updated 2/12 Consultants:  palliative  Code Status:  FULL   DVT Prophylaxis:  apixaban   Procedures: As Listed in Progress Note Above  Antibiotics: None    Subjective: Patient denies cp, sob, abd pain, n/v  Objective: Vitals:   02/25/20 0828 02/25/20 0949 02/25/20 1439 02/25/20 2317  BP: (!) 158/63  (!) 155/64 (!) 158/64  Pulse: (!) 56 (!) 55 (!) 56 (!) 55  Resp: 16  18 18   Temp: (!) 97.4 F (36.3 C)   98.3 F (36.8 C)  TempSrc: Oral   Oral  SpO2: 97%  95% 96%    Intake/Output Summary (Last 24 hours) at 02/26/2020 0929 Last data filed at 02/26/2020 0400 Gross per 24 hour  Intake 1580 ml  Output --  Net 1580 ml   Weight change:  Exam:   General:  Pt is alert, follows commands appropriately, not in acute distress  HEENT: No icterus, No thrush, No neck mass,  [place]/AT  Cardiovascular: RRR, S1/S2, no rubs, no gallops  Respiratory: CTA bilaterally, no wheezing, no crackles, no rhonchi  Abdomen: Soft/+BS, non tender, non distended, no guarding  Extremities: No edema, No lymphangitis, No petechiae, No rashes, no synovitis   Data Reviewed: I have personally reviewed following labs and imaging studies Basic Metabolic Panel: Recent Labs  Lab 02/24/20 1420 02/25/20 0805 02/26/20 0552  NA 135 136 137  K 3.7 3.4* 3.4*  CL 97* 101 105  CO2 26 25 23   GLUCOSE 224* 132* 152*  BUN 35* 35* 34*  CREATININE 2.53* 2.64* 2.54*  CALCIUM 9.4 9.0 8.5*  MG  --  2.2  --    Liver Function Tests: Recent Labs  Lab 02/24/20 1420  AST 25  ALT 21  ALKPHOS 46  BILITOT 1.0  PROT 7.7  ALBUMIN 3.7   No results for input(s): LIPASE, AMYLASE in the last 168 hours. No results for input(s): AMMONIA in the last 168 hours. Coagulation Profile: No results for input(s): INR, PROTIME in the last 168 hours. CBC: Recent Labs  Lab 02/24/20 1420  WBC 9.7  NEUTROABS 5.9  HGB 13.5  HCT 40.9  MCV 97.8  PLT 242   Cardiac Enzymes: No results for input(s): CKTOTAL, CKMB, CKMBINDEX, TROPONINI in the last 168 hours. BNP: Invalid input(s): POCBNP CBG: Recent Labs  Lab 02/25/20 0755 02/25/20 1138 02/25/20 1637 02/25/20 2106 02/26/20 0820  GLUCAP  116* 199* 211* 166* 137*   HbA1C: Recent Labs    02/25/20 0805  HGBA1C 7.5*   Urine analysis:    Component Value Date/Time   COLORURINE YELLOW 02/24/2020 1452   APPEARANCEUR TURBID (A) 02/24/2020 1452   LABSPEC 1.010 02/24/2020 1452   PHURINE 7.0 02/24/2020 1452   GLUCOSEU NEGATIVE 02/24/2020 1452   HGBUR SMALL (A) 02/24/2020 1452   BILIRUBINUR NEGATIVE 02/24/2020 1452   KETONESUR NEGATIVE 02/24/2020 1452   PROTEINUR 100 (A) 02/24/2020 1452   NITRITE NEGATIVE 02/24/2020 1452   LEUKOCYTESUR LARGE (A) 02/24/2020 1452   Sepsis Labs: @LABRCNTIP (procalcitonin:4,lacticidven:4) ) Recent Results (from the  past 240 hour(s))  SARS CORONAVIRUS 2 (Maryagnes Carrasco 6-24 HRS) Nasopharyngeal Nasopharyngeal Swab     Status: None   Collection Time: 02/24/20  8:05 PM   Specimen: Nasopharyngeal Swab  Result Value Ref Range Status   SARS Coronavirus 2 NEGATIVE NEGATIVE Final    Comment: (NOTE) SARS-CoV-2 target nucleic acids are NOT DETECTED.  The SARS-CoV-2 RNA is generally detectable in upper and lower respiratory specimens during the acute phase of infection. Negative results do not preclude SARS-CoV-2 infection, do not rule out co-infections with other pathogens, and should not be used as the sole basis for treatment or other patient management decisions. Negative results must be combined with clinical observations, patient history, and epidemiological information. The expected result is Negative.  Fact Sheet for Patients: SugarRoll.be  Fact Sheet for Healthcare Providers: https://www.woods-mathews.com/  This test is not yet approved or cleared by the [place] FDA and  has been authorized for detection and/or diagnosis of SARS-CoV-2 by FDA under an Emergency Use Authorization (EUA). This EUA will remain  in effect (meaning this test can be used) for the duration of the COVID-19 declaration under Se ction 564(b)(1) of the Act, 21 U.S.C. section 360bbb-3(b)(1), unless the authorization is terminated or revoked sooner.  Performed at [facility], [place] [address], [place], [place] [address]      Scheduled Meds: . apixaban  2.5 mg Oral BID  . atorvastatin  40 mg Oral QPM  . carvedilol  6.25 mg Oral BID WC  . Chlorhexidine Gluconate Cloth  6 each Topical Daily  . clopidogrel  75 mg Oral Q breakfast  . famotidine  20 mg Oral Daily  . insulin aspart  0-5 Units Subcutaneous QHS  . insulin aspart  0-9 Units Subcutaneous TID WC  . silver sulfADIAZINE   Topical Daily   Continuous Infusions: . cefTAZidime (FORTAZ)  IV 2 g (02/25/20 2114)     Procedures/Studies: CT Head Wo Contrast  Result Date: 02/24/2020 CLINICAL DATA:  Mental status change EXAM: CT HEAD WITHOUT CONTRAST TECHNIQUE: Contiguous axial images were obtained from the base of the skull through the vertex without intravenous contrast. COMPARISON:  CT head 12/06/2019 FINDINGS: Brain: Generalized atrophy. Mild white matter hypodensity bilaterally appears chronic. Negative for acute infarct, hemorrhage, mass Vascular: Negative for hyperdense vessel Skull: Negative calvarium. Sinuses/Orbits: Paranasal sinuses clear. Bilateral cataract extraction Other: None IMPRESSION: No acute abnormality No interval change. Atrophy and chronic microvascular ischemic change in the white matter. Electronically Signed   By: Franchot Gallo M.D.   On: 02/24/2020 14:42   MR BRAIN WO CONTRAST  Result Date: 02/24/2020 CLINICAL DATA:  Mental status change EXAM: MRI HEAD WITHOUT CONTRAST TECHNIQUE: Multiplanar, multiecho pulse sequences of the brain and surrounding structures were obtained without intravenous contrast. COMPARISON:  January 2021 FINDINGS: Motion artifact is present on some sequences. Axial T1 and coronal T2 sequences were  not obtained. Axial T2 FLAIR is particularly degraded. Brain: There is no acute infarction or intracranial hemorrhage. There is no intracranial mass, mass effect, or edema. There is no hydrocephalus. There is diffuse pachymeningeal thickening versus thin subdural collections bilaterally along the convexities and the falx without mass effect. In retrospect, likely present on prior study. Prominence of the ventricles and sulci reflects generalized parenchymal volume loss. Patchy T2 hyperintensity in the supratentorial white matter is nonspecific but may reflect chronic microvascular ischemic changes. Vascular: Major vessel flow voids at the skull base are preserved. Skull and upper cervical spine: Marrow signal is within normal limits. Sinuses/Orbits: Minor mucosal thickening.  Bilateral lens replacements. Other: Sella is unremarkable.  Mastoid air cells are clear. IMPRESSION: Partially motion degraded study. No evidence of recent infarction, hemorrhage, or mass. Suspected diffuse pachymeningeal thickening versus thin subdural collections along the bilateral cerebral convexities and falx without mass effect. Likely present on 2021 examination. Chronic microvascular ischemic changes. Electronically Signed   By: Macy Mis M.D.   On: 02/24/2020 16:44   DG Chest Port 1 View  Result Date: 02/24/2020 CLINICAL DATA:  Altered mental status EXAM: PORTABLE CHEST 1 VIEW COMPARISON:  12/08/2019 FINDINGS: Cardiac shadow is stable. Aortic calcifications are seen. Right lung is clear. Left lung demonstrates evidence of moderate effusion inline in 1 atelectatic changes. No bony abnormality is seen. IMPRESSION: Right-sided effusion and atelectasis increased from the prior exam. Electronically Signed   By: Inez Catalina M.D.   On: 02/24/2020 15:06   EEG adult  Result Date: 02/25/2020 Lora Havens, MD     02/25/2020  4:44 PM Patient Name: [name] MRN: [id number] Epilepsy Attending: Lora Havens Referring Physician/Provider: Dr Shanon Brow Tray Klayman Date: 02/25/2020 Duration: 24.01 mins Patient history: 87yo M with ams. EEG to evaluate for seizur Level of alertness: Awake AEDs during EEG study: None Technical aspects: This EEG study was done with scalp electrodes positioned according to the 10-20 International system of electrode placement. Electrical activity was acquired at a sampling rate of 500Hz  and reviewed with a high frequency filter of 70Hz  and a low frequency filter of 1Hz . EEG data were recorded continuously and digitally stored. Description: No posterior dominant rhythm was seen. EEG showed continuous generalized 5 to 6 Hz theta as well as intermittent 2-3Hz  delta slowing. Hyperventilation and photic stimulation were not performed.   ABNORMALITY -Continuous slow, generalized  IMPRESSION: This study is suggestive of moderate diffuse encephalopathy, nonspecific etiology. No seizures or epileptiform discharges were seen throughout the recording. [place], DO  [facility]  If 7PM-7AM, please contact night-coverage [email or web address] Password [facility] 02/26/2020, 9:29 AM   LOS: 1 day

## 2020-02-27 DIAGNOSIS — N184 Chronic kidney disease, stage 4 (severe): Secondary | ICD-10-CM | POA: Diagnosis not present

## 2020-02-27 DIAGNOSIS — N390 Urinary tract infection, site not specified: Secondary | ICD-10-CM | POA: Diagnosis not present

## 2020-02-27 DIAGNOSIS — I10 Essential (primary) hypertension: Secondary | ICD-10-CM | POA: Diagnosis not present

## 2020-02-27 DIAGNOSIS — I5032 Chronic diastolic (congestive) heart failure: Secondary | ICD-10-CM | POA: Diagnosis not present

## 2020-02-27 LAB — URINE CULTURE: Culture: >=100000 — AB

## 2020-02-27 LAB — GLUCOSE, CAPILLARY: Glucose-Capillary: 137 mg/dL — ABNORMAL HIGH (ref 70–99)

## 2020-02-27 MED ORDER — CIPROFLOXACIN HCL 250 MG PO TABS: 250.0000 mg | ORAL_TABLET | Freq: Two times a day (BID) | ORAL | 0 refills | Status: DC

## 2020-02-27 NOTE — Progress Notes (Signed)
Pharmacy Antibiotic Note  [name] is a 87 y.o. male admitted on 02/24/2020 with AMS, suspected UTI. Hx of pseudomonas and E faecalis UTI in Nov 2021 with chronic indwelling foley catheter. Pharmacy has been consulted for ceftazidime dosing.  No current weight, last weight in EMR was 99.3kg at 01/28/20 office visit. Clcr ~ 21 ml/min.   Plan: Continue ceftazidime 2g Q24hr  Monitor cultures, clinical status, renal fx Narrow abx as able and f/u duration    Temp (24hrs), Avg:98.4 F (36.9 C), Min:98 F (36.7 C), Max:98.6 F (37 C)  Recent Labs  Lab 02/24/20 1420 02/24/20 1817 02/25/20 0805 02/26/20 0552  WBC 9.7  --   --   --   CREATININE 2.53*  --  2.64* 2.54*  LATICACIDVEN 1.6 1.0  --   --     CrCl cannot be calculated (Unknown ideal weight.).    Allergies  Allergen Reactions  . Iodinated Diagnostic Agents Other (See Comments)    caused Fever  . Dye Fdc Red [Red Dye] Hives    Tolerated red Docusate, Niferex    Antimicrobials this admission: Ceftaz 2/10 >>   CTX 2/10 x1  Microbiology results: 2/10 UCx: pseudomonas   Thank you for allowing pharmacy to be a part of this patient's care.  Margot Ables, PharmD Clinical Pharmacist 02/27/2020 8:23 AM

## 2020-02-27 NOTE — Progress Notes (Signed)
Discharge instructions reviewed with patient's son. Addressed any questions and concerns during review. Patient's IV removed and bandaid applied. Patient discharged at this time and taken to main entrance via personal wheelchair.

## 2020-02-27 NOTE — Discharge Summary (Signed)
Physician Discharge Summary  [name] IWP:[id number] DOB: 16-Dec-1927 DOA: 02/24/2020  PCP: Redmond School, MD  Admit date: 02/24/2020 Discharge date: 02/27/2020  Admitted From: Home Disposition:  Home  Recommendations for Outpatient Follow-up:  1. Follow up with PCP in 1-2 weeks 2. Please obtain BMP/CBC in one week  Discharge Condition: Stable CODE STATUS: FULL Diet recommendation: Heart Healthy / Carb Modified   Brief/Interim Summary: 87 year old male with a history of coronary artery disease, NSTEMI, bilateral BKA, TIA, chronic indwelling Foley catheter, hypertension, hyperlipidemia, hypothyroidism, diabetes mellitus, CKD stage IV, paroxysmal atrial fibrillation, diastolic CHF, and moderate aortic stenosis presenting with altered mental status. The patient has had 2 recent hospitalizations, most recently 12/06/2019 to 12/13/2019 when he was treated for sepsis and pneumonia. He also had a UTI at that time with Pseudomonas and Enterococcus faecalis. In addition, the patient also was admitted from 10/04/2019 to 10/12/2019 secondary to NSTEMI. He was also noted to have acute on chronic renal failure and fluid overload and UTI at that time. At baseline, the patient does not ambulate. He usually sits in the recliner or wheelchair all day. He needs assistance with all his activities of daily living. He is usually alert and oriented x3. Son denies any new medications. There are no reports of fever, chills, headache, chest pain, shortness breath, coughing, hemoptysis, nausea, vomiting or diarrhea, abdominal pain.  Patient is a poor historian. History is obtained from review of the medical record and speaking with the patient's son who is the primary caregiver. Apparently, the patient had been in his usual state of health until 02/24/2020. Apparently, the patient had just been placed in a recliner after getting back from the bathroom. Apparently, the patient began screaming in pain  for about 2 minutes. Subsequently, his son noticed him to be less responsive about 5 minutes after the screaming episode. His son checked up on him and noted the patient was just staring into space and not responding appropriately. The patient was awake, but did not follow any commands or phonate. The family subsequently then brought the patient to the emergency department for further evaluation. The patient had remained awake during the transportation to the emergency department, and just kept staring into space without responding appropriately. The entire episode lasted about 4 hours. Prior to taking the patient to the MRI of the brain the patient began speaking and became more alert. He hadnot returned to baseline, but was improving. Work-up in the emergency department revealed that the patient had pyuria with a UA >50 WBC.WBC 9.7, hemoglobin 13.5, platelets 242,000. BMP showed a serum creatinine of 2.53 which is near his baseline. Sodium 135. MRI of the brain was negative for acute infarct or other acute changes. Chest x-ray showed chronic stable changes no right pleural effusion. Lactic acid peaked at 1.6. Patient was started on ceftazidime and admitted for further evaluation.   Discharge Diagnoses:   Acute metabolic encephalopathy -Secondary to UTI dehydration -CT and MRI brain negative for acute findings -UA >50 WBC -Continue IV ceftazidime pending culture data -Continued IV fluids -TSH 2.659 -Obtain EEG--mod diffuseencephalopathy -Serum N05--397 -Folic QBHA--1.9 -overall improved with abx and IVF  CAUTI--Pseudomonas -Patient has chronic indwelling Foley catheter -Last changed on 02/08/2019 -Continue ceftazidime pending culture data -d/c home with cipro bid x 5 more days  Coronary artery disease with history of NSTEMI -No chest pain presently -Personally reviewed EKG--sinus rhythm, nonspecific T wave change -Personally reviewed chest x-ray--interstitial markings,  right pleural effusion -Continue Plavix -Continue Lipitor  Chronicdiastolic CHF -  Appears clinically euvolemic--actually a bit on the dry side -Judicious IV fluids -08/26/2019 echo EF 55-60%, no WMA, grade 1 DD, moderate to severe aortic stenosis -Continue carvedilol  CKD stage IV -Baseline creatinine 2.2-2.5 -serum creatinine 2.54 at time of d/c  Uncontrolled diabetes mellitus type 2 with hyperglycemia -Discontinue glipizide -NovoLog sliding scale -02/25/20 Hemoglobin A1c--7.5  Paroxysmal atrial fibrillation -Rate controlled -Continue apixaban  Hypothyroidism -Continue Synthroid  Urinary retention -Patient has chronic indwelling Foley catheter -no longer on Flomax and finasteride  Essential hypertension -Continue carvedilol and amlodipine  Hyperlipidemia -Continue statin  Bilateral BKA -PT evaluation-->HHPT  Stage 2 sacral decubitus -present on admission -not infected on exam -consult wound care  Hypokalemia -add KCl to IVF -mag 2-2  Goals of Care -full scope of care presently -palliative medicine consult-->continue full scope of care      Allergies as of 02/27/2020      Reactions   Iodinated Diagnostic Agents Other (See Comments)   caused Fever   Dye Fdc Red [red Dye] Hives   Tolerated red Docusate, Niferex      Medication List    STOP taking these medications   finasteride 5 MG tablet Commonly known as: PROSCAR   GLUCOSAMINE CHOND MSM FORMULA PO   hydrOXYzine 25 MG tablet Commonly known as: ATARAX/VISTARIL   tamsulosin 0.4 MG Caps capsule Commonly known as: FLOMAX     TAKE these medications   acetaminophen 500 MG tablet Commonly known as: TYLENOL Take 500 mg by mouth every 6 (six) hours as needed for headache (pain).   amLODipine 10 MG tablet Commonly known as: NORVASC Take 1 tablet (10 mg total) by mouth daily.   apixaban 5 MG Tabs tablet Commonly known as: ELIQUIS Take 2.5 mg by mouth daily.   atorvastatin 40 MG  tablet Commonly known as: LIPITOR Take 1 tablet (40 mg total) by mouth every evening.   AZO CRANBERRY URINARY TRACT PO Take 1 tablet by mouth daily.   betamethasone dipropionate 0.05 % cream Apply 1 application topically 2 (two) times daily.   carvedilol 6.25 MG tablet Commonly known as: COREG Take 1 tablet (6.25 mg total) by mouth 2 (two) times daily with a meal.   ciprofloxacin 250 MG tablet Commonly known as: CIPRO Take 1 tablet (250 mg total) by mouth 2 (two) times daily.   clopidogrel 75 MG tablet Commonly known as: PLAVIX Take 1 tablet (75 mg total) by mouth daily with breakfast.   co-enzyme Q-10 30 MG capsule Take 100 mg by mouth daily.   dorzolamide-timolol 22.3-6.8 MG/ML ophthalmic solution Commonly known as: COSOPT Place 1 drop into both eyes 2 (two) times daily.   famotidine 20 MG tablet Commonly known as: PEPCID Take 1 tablet (20 mg total) by mouth daily.   ferrous sulfate 325 (65 FE) MG tablet Take 325 mg by mouth 2 (two) times daily with a meal.   furosemide 20 MG tablet Commonly known as: Lasix Take 1 tablet (20 mg total) by mouth daily.   glipiZIDE 5 MG tablet Commonly known as: GLUCOTROL Take 5 mg by mouth 2 (two) times daily.   LANTISEPTIC SKIN PROTECTANT EX Apply 1 application topically daily.   latanoprost 0.005 % ophthalmic solution Commonly known as: XALATAN Place 1 drop into both eyes at bedtime.   levothyroxine 75 MCG tablet Commonly known as: SYNTHROID Take 75 mcg by mouth daily before breakfast.   OneTouch Ultra test strip Generic drug: glucose blood 1 each 2 (two) times daily.   polyethylene glycol 17 g packet  Commonly known as: MiraLax Take 17 g by mouth daily as needed for moderate constipation.   potassium chloride SA 20 MEQ tablet Commonly known as: KLOR-CON Take 20 mEq by mouth 2 (two) times daily.   pyridoxine 100 MG tablet Commonly known as: B-6 Take 100 mg by mouth daily.   traMADol 50 MG tablet Commonly known  as: Ultram Take 1 tablet (50 mg total) by mouth every 6 (six) hours as needed.   vitamin C 1000 MG tablet Take 1,000 mg by mouth daily.   Vitamin D-3 125 MCG (5000 UT) Tabs Take 5,000 Units by mouth every evening.   vitamin E 180 MG (400 UNITS) capsule Take 400 Units by mouth daily.   zinc gluconate 50 MG tablet Take 50 mg by mouth daily.       Follow-up Information    [facility] Follow up.   Contact information: [place] [phone]             Allergies  Allergen Reactions  . Iodinated Diagnostic Agents Other (See Comments)    caused Fever  . Dye Fdc Red [Red Dye] Hives    Tolerated red Docusate, Niferex    Consultations:  palliative   Procedures/Studies: CT Head Wo Contrast  Result Date: 02/24/2020 CLINICAL DATA:  Mental status change EXAM: CT HEAD WITHOUT CONTRAST TECHNIQUE: Contiguous axial images were obtained from the base of the skull through the vertex without intravenous contrast. COMPARISON:  CT head 12/06/2019 FINDINGS: Brain: Generalized atrophy. Mild white matter hypodensity bilaterally appears chronic. Negative for acute infarct, hemorrhage, mass Vascular: Negative for hyperdense vessel Skull: Negative calvarium. Sinuses/Orbits: Paranasal sinuses clear. Bilateral cataract extraction Other: None IMPRESSION: No acute abnormality No interval change. Atrophy and chronic microvascular ischemic change in the white matter. Electronically Signed   By: Franchot Gallo M.D.   On: 02/24/2020 14:42   MR BRAIN WO CONTRAST  Result Date: 02/24/2020 CLINICAL DATA:  Mental status change EXAM: MRI HEAD WITHOUT CONTRAST TECHNIQUE: Multiplanar, multiecho pulse sequences of the brain and surrounding structures were obtained without intravenous contrast. COMPARISON:  January 2021 FINDINGS: Motion artifact is present on some sequences. Axial T1 and coronal T2 sequences were not obtained. Axial T2 FLAIR is particularly  degraded. Brain: There is no acute infarction or intracranial hemorrhage. There is no intracranial mass, mass effect, or edema. There is no hydrocephalus. There is diffuse pachymeningeal thickening versus thin subdural collections bilaterally along the convexities and the falx without mass effect. In retrospect, likely present on prior study. Prominence of the ventricles and sulci reflects generalized parenchymal volume loss. Patchy T2 hyperintensity in the supratentorial white matter is nonspecific but may reflect chronic microvascular ischemic changes. Vascular: Major vessel flow voids at the skull base are preserved. Skull and upper cervical spine: Marrow signal is within normal limits. Sinuses/Orbits: Minor mucosal thickening. Bilateral lens replacements. Other: Sella is unremarkable.  Mastoid air cells are clear. IMPRESSION: Partially motion degraded study. No evidence of recent infarction, hemorrhage, or mass. Suspected diffuse pachymeningeal thickening versus thin subdural collections along the bilateral cerebral convexities and falx without mass effect. Likely present on 2021 examination. Chronic microvascular ischemic changes. Electronically Signed   By: Macy Mis M.D.   On: 02/24/2020 16:44   DG Chest Port 1 View  Result Date: 02/24/2020 CLINICAL DATA:  Altered mental status EXAM: PORTABLE CHEST 1 VIEW COMPARISON:  12/08/2019 FINDINGS: Cardiac shadow is stable. Aortic calcifications are seen. Right lung is clear. Left lung demonstrates evidence of moderate effusion  inline in 1 atelectatic changes. No bony abnormality is seen. IMPRESSION: Right-sided effusion and atelectasis increased from the prior exam. Electronically Signed   By: Inez Catalina M.D.   On: 02/24/2020 15:06   EEG adult  Result Date: 02/25/2020 Lora Havens, MD     02/25/2020  4:44 PM Patient Name: [name] MRN: [id number] Epilepsy Attending: Lora Havens Referring Physician/Provider: Dr Shanon Brow Cari Vandeberg Date: 02/25/2020  Duration: 24.01 mins Patient history: 87yo M with ams. EEG to evaluate for seizur Level of alertness: Awake AEDs during EEG study: None Technical aspects: This EEG study was done with scalp electrodes positioned according to the 10-20 International system of electrode placement. Electrical activity was acquired at a sampling rate of 500Hz  and reviewed with a high frequency filter of 70Hz  and a low frequency filter of 1Hz . EEG data were recorded continuously and digitally stored. Description: No posterior dominant rhythm was seen. EEG showed continuous generalized 5 to 6 Hz theta as well as intermittent 2-3Hz  delta slowing. Hyperventilation and photic stimulation were not performed.   ABNORMALITY -Continuous slow, generalized IMPRESSION: This study is suggestive of moderate diffuse encephalopathy, nonspecific etiology. No seizures or epileptiform discharges were seen throughout the recording. Lora Havens        Discharge Exam: Vitals:   02/26/20 2035 02/27/20 0627  BP: (!) 166/63 (!) 168/62  Pulse: (!) 51 (!) 52  Resp: 16 18  Temp: 98.5 F (36.9 C) 98 F (36.7 C)  SpO2: 99% 96%   Vitals:   02/25/20 2317 02/26/20 1442 02/26/20 2035 02/27/20 0627  BP: (!) 158/64 (!) 151/65 (!) 166/63 (!) 168/62  Pulse: (!) 55 (!) 51 (!) 51 (!) 52  Resp: 18 18 16 18   Temp: 98.3 F (36.8 C) 98.6 F (37 C) 98.5 F (36.9 C) 98 F (36.7 C)  TempSrc: Oral Oral Oral Oral  SpO2: 96% 97% 99% 96%    General: Pt is alert, awake, not in acute distress Cardiovascular: RRR, S1/S2 +, no rubs, no gallops Respiratory: bibasilar rales. No wheeze Abdominal: Soft, NT, ND, bowel sounds + Extremities: no edema, no cyanosis   The results of significant diagnostics from this hospitalization (including imaging, microbiology, ancillary and laboratory) are listed below for reference.    Significant Diagnostic Studies: CT Head Wo Contrast  Result Date: 02/24/2020 CLINICAL DATA:  Mental status change EXAM: CT HEAD  WITHOUT CONTRAST TECHNIQUE: Contiguous axial images were obtained from the base of the skull through the vertex without intravenous contrast. COMPARISON:  CT head 12/06/2019 FINDINGS: Brain: Generalized atrophy. Mild white matter hypodensity bilaterally appears chronic. Negative for acute infarct, hemorrhage, mass Vascular: Negative for hyperdense vessel Skull: Negative calvarium. Sinuses/Orbits: Paranasal sinuses clear. Bilateral cataract extraction Other: None IMPRESSION: No acute abnormality No interval change. Atrophy and chronic microvascular ischemic change in the white matter. Electronically Signed   By: Franchot Gallo M.D.   On: 02/24/2020 14:42   MR BRAIN WO CONTRAST  Result Date: 02/24/2020 CLINICAL DATA:  Mental status change EXAM: MRI HEAD WITHOUT CONTRAST TECHNIQUE: Multiplanar, multiecho pulse sequences of the brain and surrounding structures were obtained without intravenous contrast. COMPARISON:  January 2021 FINDINGS: Motion artifact is present on some sequences. Axial T1 and coronal T2 sequences were not obtained. Axial T2 FLAIR is particularly degraded. Brain: There is no acute infarction or intracranial hemorrhage. There is no intracranial mass, mass effect, or edema. There is no hydrocephalus. There is diffuse pachymeningeal thickening versus thin subdural collections bilaterally along the convexities and the falx  without mass effect. In retrospect, likely present on prior study. Prominence of the ventricles and sulci reflects generalized parenchymal volume loss. Patchy T2 hyperintensity in the supratentorial white matter is nonspecific but may reflect chronic microvascular ischemic changes. Vascular: Major vessel flow voids at the skull base are preserved. Skull and upper cervical spine: Marrow signal is within normal limits. Sinuses/Orbits: Minor mucosal thickening. Bilateral lens replacements. Other: Sella is unremarkable.  Mastoid air cells are clear. IMPRESSION: Partially motion  degraded study. No evidence of recent infarction, hemorrhage, or mass. Suspected diffuse pachymeningeal thickening versus thin subdural collections along the bilateral cerebral convexities and falx without mass effect. Likely present on 2021 examination. Chronic microvascular ischemic changes. Electronically Signed   By: Macy Mis M.D.   On: 02/24/2020 16:44   DG Chest Port 1 View  Result Date: 02/24/2020 CLINICAL DATA:  Altered mental status EXAM: PORTABLE CHEST 1 VIEW COMPARISON:  12/08/2019 FINDINGS: Cardiac shadow is stable. Aortic calcifications are seen. Right lung is clear. Left lung demonstrates evidence of moderate effusion inline in 1 atelectatic changes. No bony abnormality is seen. IMPRESSION: Right-sided effusion and atelectasis increased from the prior exam. Electronically Signed   By: Inez Catalina M.D.   On: 02/24/2020 15:06   EEG adult  Result Date: 02/25/2020 Lora Havens, MD     02/25/2020  4:44 PM Patient Name: [name] MRN: [id number] Epilepsy Attending: Lora Havens Referring Physician/Provider: Dr Shanon Brow Taj Arteaga Date: 02/25/2020 Duration: 24.01 mins Patient history: 87yo M with ams. EEG to evaluate for seizur Level of alertness: Awake AEDs during EEG study: None Technical aspects: This EEG study was done with scalp electrodes positioned according to the 10-20 International system of electrode placement. Electrical activity was acquired at a sampling rate of 500Hz  and reviewed with a high frequency filter of 70Hz  and a low frequency filter of 1Hz . EEG data were recorded continuously and digitally stored. Description: No posterior dominant rhythm was seen. EEG showed continuous generalized 5 to 6 Hz theta as well as intermittent 2-3Hz  delta slowing. Hyperventilation and photic stimulation were not performed.   ABNORMALITY -Continuous slow, generalized IMPRESSION: This study is suggestive of moderate diffuse encephalopathy, nonspecific etiology. No seizures or epileptiform  discharges were seen throughout the recording. Lora Havens     Microbiology: Recent Results (from the past 240 hour(s))  Urine culture     Status: Abnormal (Preliminary result)   Collection Time: 02/24/20  2:52 PM   Specimen: Urine, Catheterized  Result Value Ref Range Status   Specimen Description   Final    URINE, CATHETERIZED Performed at [facility], [address], [place], [place] [address]    Special Requests   Final    NONE Performed at [facility], [address] [place], [place] [address]    Culture >=100,000 COLONIES/mL PSEUDOMONAS AERUGINOSA (A)  Final   Report Status PENDING  Incomplete  SARS CORONAVIRUS 2 (Lillyanna Glandon 6-24 HRS) Nasopharyngeal Nasopharyngeal Swab     Status: None   Collection Time: 02/24/20  8:05 PM   Specimen: Nasopharyngeal Swab  Result Value Ref Range Status   SARS Coronavirus 2 NEGATIVE NEGATIVE Final    Comment: (NOTE) SARS-CoV-2 target nucleic acids are NOT DETECTED.  The SARS-CoV-2 RNA is generally detectable in upper and lower respiratory specimens during the acute phase of infection. Negative results do not preclude SARS-CoV-2 infection, do not rule out co-infections with other pathogens, and should not be used as the sole basis for treatment or other patient management decisions. Negative  results must be combined with clinical observations, patient history, and epidemiological information. The expected result is Negative.  Fact Sheet for Patients: SugarRoll.be  Fact Sheet for Healthcare Providers: https://www.woods-mathews.com/  This test is not yet approved or cleared by the [place] FDA and  has been authorized for detection and/or diagnosis of SARS-CoV-2 by FDA under an Emergency Use Authorization (EUA). This EUA will remain  in effect (meaning this test can be used) for the duration of the COVID-19 declaration under Se ction 564(b)(1) of the Act, 21 U.S.C. section 360bbb-3(b)(1),  unless the authorization is terminated or revoked sooner.  Performed at [facility], [place] [address] [place], [place] [address]      Labs: Basic Metabolic Panel: Recent Labs  Lab 02/24/20 1420 02/25/20 0805 02/26/20 0552  NA 135 136 137  K 3.7 3.4* 3.4*  CL 97* 101 105  CO2 26 25 23   GLUCOSE 224* 132* 152*  BUN 35* 35* 34*  CREATININE 2.53* 2.64* 2.54*  CALCIUM 9.4 9.0 8.5*  MG  --  2.2  --    Liver Function Tests: Recent Labs  Lab 02/24/20 1420  AST 25  ALT 21  ALKPHOS 46  BILITOT 1.0  PROT 7.7  ALBUMIN 3.7   No results for input(s): LIPASE, AMYLASE in the last 168 hours. No results for input(s): AMMONIA in the last 168 hours. CBC: Recent Labs  Lab 02/24/20 1420  WBC 9.7  NEUTROABS 5.9  HGB 13.5  HCT 40.9  MCV 97.8  PLT 242   Cardiac Enzymes: No results for input(s): CKTOTAL, CKMB, CKMBINDEX, TROPONINI in the last 168 hours. BNP: Invalid input(s): POCBNP CBG: Recent Labs  Lab 02/26/20 0820 02/26/20 1217 02/26/20 1630 02/26/20 2036 02/27/20 0751  GLUCAP 137* 170* 154* 176* 137*    Time coordinating discharge:  36 minutes  Signed:  Orson Eva, DO [facility] Pager: [phone] 02/27/2020, 10:19 AM

## 2020-02-27 NOTE — TOC Transition Note (Addendum)
Transition of Care [facility]) - CM/SW Discharge Note   Patient Details  Name: [name] MRN: [id number] Date of Birth: 03-Mar-1927  Transition of Care [facility]) CM/SW Contact:  Natasha Bence, LCSW Phone Number: 02/27/2020, 2:34 PM   Clinical Narrative:    CSW notified of patient's readiness for discharge. CSW contacted patient's son for [facility] referral. Pt's son reported he is active with [facility] but would also need PT. CSW contacted Corene Cornea with [facility] to resume services and add PT. Corene Cornea agreeable to resume services upon discharge. TOC signing off.    Final next level of care: [place] Barriers to Discharge: Barriers Resolved   Patient Goals and CMS Choice Patient states their goals for this hospitalization and ongoing recovery are:: Return home CMS Medicare.gov Compare Post Acute Care list provided to:: Patient Choice offered to / list presented to : [name]  Discharge Placement                    Patient and family notified of of transfer: 02/27/20  Discharge Plan and Services                DME Arranged: N/A DME Agency: NA       HH Arranged: PT,RN [place]: [place] ([place]) Date [place]: 02/27/20 Time [place]: [id number] Representative spoke with at [place]: [place] ([place]) Interventions     Readmission Risk Interventions Readmission Risk Prevention Plan 02/25/2020 12/13/2019 12/08/2019  Transportation Screening Complete - Complete  PCP or Specialist Appt within 3-5 Days - - -  Not Complete comments - - -  [place] or Home Care Consult Complete - -  [place] or Home Care Consult comments - - -  Social Work Consult for [place] Planning/Counseling Complete - -  Palliative Care Screening Complete - -  Medication Review Press photographer) Complete - Complete  PCP or Specialist appointment within 3-5 days of discharge - Complete -  [place] or [place] - - Complete  [place] - - Not Applicable  Some recent data might be hidden

## 2020-02-29 ENCOUNTER — Other Ambulatory Visit: Payer: Self-pay | Admitting: *Deleted

## 2020-02-29 NOTE — Patient Outreach (Signed)
[place] [facility]) Care Management  02/29/2020  DYLANN GALLIER 09/24/1927 [id number]   Noted that member discharged from hospital on 2/13.  Call placed to son/caregiver, state this is not a good time to talk.  Will follow up within the next 3-4 business days.  [name], [place], MSN [place] [phone]

## 2020-03-01 ENCOUNTER — Other Ambulatory Visit: Payer: Self-pay | Admitting: *Deleted

## 2020-03-01 DIAGNOSIS — D631 Anemia in chronic kidney disease: Secondary | ICD-10-CM | POA: Diagnosis not present

## 2020-03-01 DIAGNOSIS — I5032 Chronic diastolic (congestive) heart failure: Secondary | ICD-10-CM | POA: Diagnosis not present

## 2020-03-01 DIAGNOSIS — L89152 Pressure ulcer of sacral region, stage 2: Secondary | ICD-10-CM | POA: Diagnosis not present

## 2020-03-01 DIAGNOSIS — I2510 Atherosclerotic heart disease of native coronary artery without angina pectoris: Secondary | ICD-10-CM | POA: Diagnosis not present

## 2020-03-01 DIAGNOSIS — I480 Paroxysmal atrial fibrillation: Secondary | ICD-10-CM | POA: Diagnosis not present

## 2020-03-01 DIAGNOSIS — I130 Hypertensive heart and chronic kidney disease with heart failure and stage 1 through stage 4 chronic kidney disease, or unspecified chronic kidney disease: Secondary | ICD-10-CM | POA: Diagnosis not present

## 2020-03-01 DIAGNOSIS — N184 Chronic kidney disease, stage 4 (severe): Secondary | ICD-10-CM | POA: Diagnosis not present

## 2020-03-01 DIAGNOSIS — Z466 Encounter for fitting and adjustment of urinary device: Secondary | ICD-10-CM | POA: Diagnosis not present

## 2020-03-01 DIAGNOSIS — E871 Hypo-osmolality and hyponatremia: Secondary | ICD-10-CM | POA: Diagnosis not present

## 2020-03-01 DIAGNOSIS — E1165 Type 2 diabetes mellitus with hyperglycemia: Secondary | ICD-10-CM | POA: Diagnosis not present

## 2020-03-01 DIAGNOSIS — E43 Unspecified severe protein-calorie malnutrition: Secondary | ICD-10-CM | POA: Diagnosis not present

## 2020-03-01 DIAGNOSIS — I252 Old myocardial infarction: Secondary | ICD-10-CM | POA: Diagnosis not present

## 2020-03-01 DIAGNOSIS — E1122 Type 2 diabetes mellitus with diabetic chronic kidney disease: Secondary | ICD-10-CM | POA: Diagnosis not present

## 2020-03-01 NOTE — Patient Outreach (Signed)
[place] [facility]) Care Management  03/01/2020  DOMNIC VANTOL 09-05-1927 [id number]   RED ON EMMI ALERT - General Discharge Day # 1 Date: 2/15 Red Alert Reason: Not scheduled follow up, Unfilled prescriptions   Outreach attempt #1, unsuccessful to son, HIPAA compliant voice message left.   Plan: RN CM will follow up within the next 3-4 business days.  [name], [place], MSN [place] [phone]

## 2020-03-03 ENCOUNTER — Ambulatory Visit: Payer: Self-pay | Admitting: *Deleted

## 2020-03-06 ENCOUNTER — Other Ambulatory Visit: Payer: Self-pay | Admitting: *Deleted

## 2020-03-06 DIAGNOSIS — E871 Hypo-osmolality and hyponatremia: Secondary | ICD-10-CM | POA: Diagnosis not present

## 2020-03-06 DIAGNOSIS — Z681 Body mass index (BMI) 19 or less, adult: Secondary | ICD-10-CM | POA: Diagnosis not present

## 2020-03-06 DIAGNOSIS — I480 Paroxysmal atrial fibrillation: Secondary | ICD-10-CM | POA: Diagnosis not present

## 2020-03-06 DIAGNOSIS — I700 Atherosclerosis of aorta: Secondary | ICD-10-CM | POA: Diagnosis not present

## 2020-03-06 DIAGNOSIS — E43 Unspecified severe protein-calorie malnutrition: Secondary | ICD-10-CM | POA: Diagnosis not present

## 2020-03-06 DIAGNOSIS — N184 Chronic kidney disease, stage 4 (severe): Secondary | ICD-10-CM | POA: Diagnosis not present

## 2020-03-06 DIAGNOSIS — N390 Urinary tract infection, site not specified: Secondary | ICD-10-CM | POA: Diagnosis not present

## 2020-03-06 DIAGNOSIS — D631 Anemia in chronic kidney disease: Secondary | ICD-10-CM | POA: Diagnosis not present

## 2020-03-06 DIAGNOSIS — R319 Hematuria, unspecified: Secondary | ICD-10-CM | POA: Diagnosis not present

## 2020-03-06 DIAGNOSIS — E1165 Type 2 diabetes mellitus with hyperglycemia: Secondary | ICD-10-CM | POA: Diagnosis not present

## 2020-03-06 DIAGNOSIS — E860 Dehydration: Secondary | ICD-10-CM | POA: Diagnosis not present

## 2020-03-06 DIAGNOSIS — Z466 Encounter for fitting and adjustment of urinary device: Secondary | ICD-10-CM | POA: Diagnosis not present

## 2020-03-06 DIAGNOSIS — I130 Hypertensive heart and chronic kidney disease with heart failure and stage 1 through stage 4 chronic kidney disease, or unspecified chronic kidney disease: Secondary | ICD-10-CM | POA: Diagnosis not present

## 2020-03-06 DIAGNOSIS — L89152 Pressure ulcer of sacral region, stage 2: Secondary | ICD-10-CM | POA: Diagnosis not present

## 2020-03-06 DIAGNOSIS — I2510 Atherosclerotic heart disease of native coronary artery without angina pectoris: Secondary | ICD-10-CM | POA: Diagnosis not present

## 2020-03-06 DIAGNOSIS — I5032 Chronic diastolic (congestive) heart failure: Secondary | ICD-10-CM | POA: Diagnosis not present

## 2020-03-06 DIAGNOSIS — E1122 Type 2 diabetes mellitus with diabetic chronic kidney disease: Secondary | ICD-10-CM | POA: Diagnosis not present

## 2020-03-06 DIAGNOSIS — I252 Old myocardial infarction: Secondary | ICD-10-CM | POA: Diagnosis not present

## 2020-03-06 NOTE — Patient Outreach (Signed)
[facility]) Care Management  03/06/2020  MANFRED LASPINA Jun 01, 1927 [id number]   RED ON EMMI ALERT - General Discharge Day # 1 Date: 2/15 Red Alert Reason: Not scheduled follow up, Unfilled prescriptions   Outreach attempt #2, successful to son.    Son report member has been doing much better since discharge.  State he was in hospital for UTI and dehydration. He has since been taking antibiotics and stopped taking medications that were discontinued on discharge.  He will follow up with PCP today and with cardiology on Wednesday.  Follow up with nephrology on 3/23.  Son report member still has home health for wound care and PT, last visit today.  Blood sugars have ranged from 120-200, denies any hypoglycemic episodes.  Denies any urgent concerns, encouraged to contact this care manager with questions.  Agrees to follow up within the next month.  Goals Addressed            This Visit's Progress   . [facility] - Monitor and Manage My Blood Sugar   On track    Follow Up Date 03/06/2020  Timeframe:  Short-Term Goal Priority:  Medium Start Date:          2/21         Expected End Date:   3/21                  - check blood sugar at prescribed times - check blood sugar if I feel it is too high or too low - take the blood sugar log to all doctor visits    Why is this important?   Checking your blood sugar at home helps to keep it from getting very high or very low.  Writing the results in a diary or log helps the doctor know how to care for you.  Your blood sugar log should have the time, date and the results.  Also, write down the amount of insulin or other medicine that you take.  Other information, like what you ate, exercise done and how you were feeling, will also be helpful.     Notes:   11/18 - blood sugar readings reviewed, discussed compliance with medication regime  12/28 - Discussed proper diabetic and low sodium diet  02/11/2020 - Remind of importance of  managing blood sugars in correlation to wound healing  2/21 - Confirmed member has ongoing participation with home health for weekly monitoring    . [facility] - Set My Target A1C   On track    Follow Up Date 04/03/2020  Timeframe:  Long-Range Goal Priority:  Medium Start Date:       2/21                      Expected End Date:   06/10/2020                      - set target A1C (7)    Why is this important?   Your target A1C is decided together by you and your doctor.  It is based on several things like your age and other health issues.    Notes:   11/18 - Discussed dose change for oral diabetic agents  12/28 - Call placed to PCP office requesting order for new A1C lab  1/28 - Re-educated on DM management in effort to decrease A1C  2/21 - Review CBG trends, encouraged to continue plan of care    .  [facility] -Improve My Heart Health   On track    Follow Up Date 04/03/2020  Timeframe:  Short-Term Goal Priority:  High Start Date:    03/06/2020                         Expected End Date:       04/10/2020                  - learn about small changes that will make a big difference - learn my personal risk factors    Why is this important?   Lifestyle changes are key to improving the blood flow to your heart. Think about the things you can change and set a goal to live healthy.  Remember, when the blood vessels to your heart start to get clogged you may not have any symptoms.  Over time, they can get worse.  Don't ignore the signs, like chest pain, and get help right away.     Notes:   11/18 - discussed antihypertensive dose change and BP trends  12/28 - Reviewed BP trends since medication changes, encouraged to continue to monitor  1/28 - Re-educated son on proper diet to promote heart health      [name], RN, MSN [place] Manager [phone]

## 2020-03-07 ENCOUNTER — Ambulatory Visit: Payer: Self-pay | Admitting: *Deleted

## 2020-03-08 ENCOUNTER — Encounter: Payer: Self-pay | Admitting: Internal Medicine

## 2020-03-08 ENCOUNTER — Other Ambulatory Visit: Payer: Self-pay

## 2020-03-08 ENCOUNTER — Ambulatory Visit (INDEPENDENT_AMBULATORY_CARE_PROVIDER_SITE_OTHER): Payer: Medicare Other | Admitting: Internal Medicine

## 2020-03-08 VITALS — BP 112/68 | HR 57 | Ht 68.0 in | Wt 210.0 lb

## 2020-03-08 DIAGNOSIS — I480 Paroxysmal atrial fibrillation: Secondary | ICD-10-CM

## 2020-03-08 DIAGNOSIS — I130 Hypertensive heart and chronic kidney disease with heart failure and stage 1 through stage 4 chronic kidney disease, or unspecified chronic kidney disease: Secondary | ICD-10-CM | POA: Diagnosis not present

## 2020-03-08 DIAGNOSIS — D631 Anemia in chronic kidney disease: Secondary | ICD-10-CM | POA: Diagnosis not present

## 2020-03-08 DIAGNOSIS — I5033 Acute on chronic diastolic (congestive) heart failure: Secondary | ICD-10-CM | POA: Diagnosis not present

## 2020-03-08 DIAGNOSIS — I350 Nonrheumatic aortic (valve) stenosis: Secondary | ICD-10-CM | POA: Diagnosis not present

## 2020-03-08 DIAGNOSIS — I5032 Chronic diastolic (congestive) heart failure: Secondary | ICD-10-CM | POA: Diagnosis not present

## 2020-03-08 DIAGNOSIS — E43 Unspecified severe protein-calorie malnutrition: Secondary | ICD-10-CM | POA: Diagnosis not present

## 2020-03-08 DIAGNOSIS — E871 Hypo-osmolality and hyponatremia: Secondary | ICD-10-CM | POA: Diagnosis not present

## 2020-03-08 DIAGNOSIS — I2510 Atherosclerotic heart disease of native coronary artery without angina pectoris: Secondary | ICD-10-CM | POA: Diagnosis not present

## 2020-03-08 DIAGNOSIS — N184 Chronic kidney disease, stage 4 (severe): Secondary | ICD-10-CM | POA: Diagnosis not present

## 2020-03-08 DIAGNOSIS — I252 Old myocardial infarction: Secondary | ICD-10-CM | POA: Diagnosis not present

## 2020-03-08 DIAGNOSIS — L89152 Pressure ulcer of sacral region, stage 2: Secondary | ICD-10-CM | POA: Diagnosis not present

## 2020-03-08 DIAGNOSIS — Z466 Encounter for fitting and adjustment of urinary device: Secondary | ICD-10-CM | POA: Diagnosis not present

## 2020-03-08 DIAGNOSIS — E1122 Type 2 diabetes mellitus with diabetic chronic kidney disease: Secondary | ICD-10-CM | POA: Diagnosis not present

## 2020-03-08 DIAGNOSIS — E1165 Type 2 diabetes mellitus with hyperglycemia: Secondary | ICD-10-CM | POA: Diagnosis not present

## 2020-03-08 NOTE — Patient Instructions (Signed)
Medication Instructions:  Your physician recommends that you continue on your current medications as directed. Please refer to the Current Medication list given to you today.  *If you need a refill on your cardiac medications before your next appointment, please call your pharmacy*   Testing/Procedures: Your physician has requested that you have an echocardiogram. Echocardiography is a painless test that uses sound waves to create images of your heart. It provides your doctor with information about the size and shape of your heart and how well your heart's chambers and valves are working. This procedure takes approximately one hour. There are no restrictions for this procedure. -- [address] - [address]rd Floor -- due August 2022   Follow-Up: At [facility], you and your health needs are our priority.  As part of our continuing mission to provide you with exceptional heart care, we have created designated Provider Care Teams.  These Care Teams include your primary Cardiologist (physician) and Advanced Practice Providers (APPs -  Physician Assistants and Nurse Practitioners) who all work together to provide you with the care you need, when you need it.  We recommend signing up for the patient portal called "MyChart".  Sign up information is provided on this After Visit Summary.  MyChart is used to connect with patients for Virtual Visits (Telemedicine).  Patients are able to view lab/test results, encounter notes, upcoming appointments, etc.  Non-urgent messages can be sent to your provider as well.   To learn more about what you can do with MyChart, go to [email or web address].    Your next appointment:   6 month(s) - after echo  The format for your next appointment:   In Person  Provider:   You may see Pixie Casino, MD or one of the following Advanced Practice Providers on your designated Care Team:    Almyra Deforest, PA-C  Fabian Sharp, PA-C or   Roby Lofts, [place]    Other  Instructions

## 2020-03-08 NOTE — Progress Notes (Signed)
OFFICE NOTE  Chief Complaint:  Follow-up  Primary Care Physician: Redmond School, MD  HPI:  [name]  is an 87 year old gentleman with history of hypertension, dyslipidemia and peripheral vascular disease. He did have claudication and in May 2012 he had high-grade mid SFA stenosis. He had a stent placed in his SFA which improved his claudication and his Doppler studies. In December 2012 his left ABI was 0.61, the right ABI 0.74 with a patent stent. He was seen back by Dr. Gwenlyn Found and recommended followup with me in the office today. His only complaints now are about occasional dizziness especially with change in position, also leg pain with walking but not claudication, mostly joint pain, hip pain which could represent arthritis, wearing glasses.   I the pleasure seeing [name] back today. It's been over a year and a half since I saw him last in the office. He reports doing fairly well and denies any chest pain or worsening shortness of breath. He denies any claudication in his legs. Blood pressure is noted to be elevated somewhat today. He is previous film lisinopril and this was stopped for unknown reasons. Not on aspirin but continues to take Plavix for his PAD.  [name] returns today for follow-up. Overall he is doing fairly well though has complaints about particular right hip pain and knee pain. He also reports some swelling in his legs gets worse during the day but improves with elevating them overnight. He says he gets some numbness and tingling in his feet as well. He had arterial Dopplers last year which show stable ABIs and no evidence of stent occlusion. He is due for repeat arterial Dopplers in April of this year. He denies any chest pain or worsening shortness of breath.  06/12/2016  [name] returns today for follow-up. Recently he saw his nephrologist to auscultated a systolic heart murmur. An echocardiogram was ordered at [facility]. This demonstrated an EF  of 60-65%, there was mild to moderate aortic stenosis with a mean gradient of 15 mmHg and calculated valve area of 1.3-1.4 cm. He denies chest pain, worsening shortness of breath, presyncope, syncope or congestive heart failure symptoms. His only other complaint today is bilateral knee pain. He has a history of knee surgery and has also been having some right hip pain. Blood pressure is at goal. He is on atorvastatin 40 mg daily followed by his primary care provider. He has a history of PAD with lower extremity arterial Dopplers in April 2017 that showed no obstructive PAD and a patent stent.  05/09/2017  [name] returns today for follow-up.  He is reporting some mild shortness of breath as well as lower extremity edema.  According to records he is gained 6 pounds since we last saw him.  He reports worsening lower extremity swelling.  He was scheduled to have a routine echocardiogram on May 30 and office visit with me on May 31, however came in earlier because of worsening shortness of breath.  EKG just shows sinus rhythm with first-degree AV block at 63, RBBB-personally reviewed.  His daughter was accompanying him today and noted that he has been out of several of his medications.  Namely he has not taken tamsulosin, finasteride and levothyroxine.  He is also out of his hydrochlorothiazide diuretic.  06/13/2017  [name] returns today for follow-up.  He had an echocardiogram yesterday which showed LVEF of 65-705%, however he now has moderate aortic stenosis, increased from mild stenosis a year  ago.  He reports marked improvement in his edema although weight is about a pound heavier than it was when I last saw him a month ago.  He is on Lasix now.  Creatinine is slightly higher on the Lasix at 1.6, but may be accepted due to his improvement in edema.  08/11/2017  [name] was seen today in follow-up.  Unfortunately was hospitalized in June with chest heaviness.  He was seen by Dr. Harl Bowie in consultation  who felt that his chest pain was atypical for cardiac pain but was noted to be a new onset atrial fibrillation.  Fortunately spontaneously converted back to sinus however was appropriately switched to Eliquis from Plavix.  He denies any recurrent A. fib since then has had no further chest discomfort.  I suspect he felt unwell either due to the A. fib or as a combination of A. fib and moderate aortic stenosis.  He is scheduled ready for a repeat echocardiogram in November and follow-up with me afterwards.  12/09/2017  [name] is seen today for follow-up of her recent echo.  This was a repeat study to assess any changes in aortic stenosis.  His last study was over 6 months ago.  This demonstrates moderate aortic stenosis with a mean gradient of 26 mmHg, from 21 mmHg previously.  He is asymptomatic with this and denies any recurrent atrial fibrillation.  EKG today shows sinus rhythm.  He is compliant with Eliquis and denies any bleeding problems.  Blood pressures been well controlled at home.  01/25/2019  [name] returns today for follow-up.  Unfortunately he has had a very difficult past year.  He has now undergone bilateral BKA's with recurrent issues due to critical limb ischemia.  He is essentially wheelchair-bound although does have a prosthesis on the right side.  He was noted to have moderate aortic stenosis by echo this past August and will need repeat echo in 6 to 8 months.  Other than struggles with his legs and nonhealing wounds, he denies any chest pain or worsening shortness of breath.  05/05/2019  [name] is seen today in follow-up.  He is accompanied by his son.  He still has difficulty walking on his BKA's.  More recently he has had some worsening edema, particularly upper extremities and his thighs.  He was seen by Dr. Gerarda Fraction, who noted he was markedly bradycardic.  EKG showed possibly a junctional rhythm with PVC at a rate in the upper 40s.  In general [name] runs a heart rate in the mid  28s.  Additionally, he was thought to be in critical aortic stenosis with associated congestive heart failure.  I had just seen [name] in January this year and noted that his repeat echo shows moderate to severe aortic stenosis with a mean gradient of just over 30 mmHg.  09/02/2019  [name] is seen today in follow-up.  He just had a repeat echo which actually shows a lower aortic valve gradient at 27 mmHg.  I suspect this might be under sampling as it is unlikely his aortic valve is improved.  He was seen in the multidisciplinary heart valve clinic by Dr. Angelena Form earlier this year and has been recommended to follow-up in September or October.  At this point does not appear that he needs urgent TAVR.  He also recently had an in office prostate procedure under nitrous oxide.  He tolerated this well after receiving cardiac clearance from me.  03/08/2020  [name] is seen  today in follow-up.  Unfortunately was again hospitalized.  He is been in the hospital several times for UTI and confusion.  It seems to occur every few months with his associated catheter change outs.  Other than this he is doing fairly well.  Creatinine is stable around 2.54.  He had moderate to severe aortic valve stenosis.  It was not thought that he was a good candidate for TAVR possibly due to his advanced chronic kidney disease but a repeat echo would be due in August 2022.  PMHx:  Past Medical History:  Diagnosis Date  . Arthritis   . Borderline diabetic   . CHF (congestive heart failure) ([place])   . Chronic kidney disease   . Diabetes mellitus without complication ([place])   . Dysrhythmia   . Glaucoma   . Hyperlipidemia   . Hypertension   . Hypothyroidism   . Myocardial infarction ([place])   . PVD (peripheral vascular disease) ([place])   . Sleep apnea    Cpap ordered but doesnt use  . Urinary retention due to benign prostatic hyperplasia 12/28/2018    Past Surgical History:  Procedure Laterality Date  . ABDOMINAL  AORTOGRAM W/LOWER EXTREMITY N/A 07/27/2018   Procedure: ABDOMINAL AORTOGRAM W/LOWER EXTREMITY;  Surgeon: Lorretta Harp, MD;  Location: [place] [facility];  Service: Cardiovascular;  Laterality: N/A;  . AMPUTATION Right 08/28/2018   Procedure: RIGHT BELOW KNEE AMPUTATION;  Surgeon: [name], MD;  Location: [place];  Service: Orthopedics;  Laterality: Right;  . AMPUTATION Left 12/02/2018   Procedure: LEFT TRANSMETATARSAL AMPUTATION AND NEGATIVE PRESSURE WOUND VAC PLACEMENT;  Surgeon: [name], MD;  Location: [place];  Service: Orthopedics;  Laterality: Left;  . AMPUTATION Left 12/18/2018   Procedure: LEFT BELOW KNEE AMPUTATION;  Surgeon: [name], MD;  Location: [place];  Service: Orthopedics;  Laterality: Left;  . APPENDECTOMY  1943  . BELOW KNEE LEG AMPUTATION Left 12/18/2018  . CATARACT EXTRACTION  2012   x2  . COLONOSCOPY N/A 11/01/2014   Procedure: COLONOSCOPY;  Surgeon: Aviva Signs, MD;  [facility];  Service: Gastroenterology;  Laterality: N/A;  . ESOPHAGOGASTRODUODENOSCOPY N/A 11/01/2014   Procedure: ESOPHAGOGASTRODUODENOSCOPY (EGD);  Surgeon: Aviva Signs, MD;  [facility];  Service: Gastroenterology;  Laterality: N/A;  . HERNIA REPAIR Left   . INGUINAL HERNIA REPAIR Right 03/01/2013   Procedure: RIGHT INGUINAL HERNIORRHAPHY;  Surgeon: Jamesetta So, MD;  Location: [facility];  Service: General;  Laterality: Right;  . INSERTION OF MESH Right 03/01/2013   Procedure: INSERTION OF MESH;  Surgeon: Jamesetta So, MD;  Location: [facility];  Service: General;  Laterality: Right;  . LOWER EXTREMITY ANGIOGRAPHY Right 08/25/2018   Procedure: LOWER EXTREMITY ANGIOGRAPHY;  Surgeon: Wellington Hampshire, MD;  Location: [place] [facility];  Service: Cardiovascular;  Laterality: Right;  . LOWER EXTREMITY ANGIOGRAPHY N/A 11/25/2018   Procedure: LOWER EXTREMITY ANGIOGRAPHY;  Surgeon: Wellington Hampshire, MD;  Location: [place] [facility];  Service: Cardiovascular;  Laterality:  N/A;  . NM MYOCAR PERF WALL MOTION  06/05/2010   no significant ischemia  . PERIPHERAL VASCULAR ATHERECTOMY Right 08/03/2018   Procedure: PERIPHERAL VASCULAR ATHERECTOMY;  Surgeon: Lorretta Harp, MD;  Location: [place] [facility];  Service: Cardiovascular;  Laterality: Right;  . PERIPHERAL VASCULAR ATHERECTOMY Left 11/23/2018   Procedure: PERIPHERAL VASCULAR ATHERECTOMY;  Surgeon: Lorretta Harp, MD;  Location: [place] [facility];  Service: Cardiovascular;  Laterality: Left;  sfa with dc balloon  .  PERIPHERAL VASCULAR BALLOON ANGIOPLASTY Left 11/25/2018   Procedure: PERIPHERAL VASCULAR BALLOON ANGIOPLASTY;  Surgeon: Wellington Hampshire, MD;  Location: [place] [facility];  Service: Cardiovascular;  Laterality: Left;  popliteal  . PERIPHERAL VASCULAR INTERVENTION Left 11/25/2018   Procedure: PERIPHERAL VASCULAR INTERVENTION;  Surgeon: Wellington Hampshire, MD;  Location: [place] [facility];  Service: Cardiovascular;  Laterality: Left;  tibial peroneal trunk and peroneal stents   . stent  06/14/2010   left leg  . STUMP REVISION Left 01/20/2019   Procedure: REVISION LEFT BELOW KNEE AMPUTATION;  Surgeon: [name], MD;  Location: [place];  Service: Orthopedics;  Laterality: Left;  . US ECHOCARDIOGRAPHY  01/21/2006   moderate mitral annular ca+, mild MR, AOV moderately sclerotic.    FAMHx:  Family History  Problem Relation Age of Onset  . Cancer Mother   . Heart attack Father   . Stroke Father   . Heart attack Brother   . Heart attack Brother     SOCHx:   reports that he quit smoking about 55 years ago. He has quit using smokeless tobacco.  His smokeless tobacco use included chew. He reports that he does not drink alcohol and does not use drugs.  ALLERGIES:  Allergies  Allergen Reactions  . Iodinated Diagnostic Agents Other (See Comments)    caused Fever  . Dye Fdc Red [Red Dye] Hives    Tolerated red Docusate, Niferex    ROS: Pertinent items noted in HPI and remainder of  comprehensive ROS otherwise negative.  HOME MEDS: Current Outpatient Medications  Medication Sig Dispense Refill  . acetaminophen (TYLENOL) 500 MG tablet Take 500 mg by mouth every 6 (six) hours as needed for headache (pain).    [name] amLODipine (NORVASC) 10 MG tablet Take 1 tablet (10 mg total) by mouth daily. 90 tablet 3  . apixaban (ELIQUIS) 5 MG TABS tablet Take 2.5 mg by mouth daily.    . Ascorbic Acid (VITAMIN C) 1000 MG tablet Take 1,000 mg by mouth daily.     . betamethasone dipropionate 0.05 % cream Apply 1 application topically 2 (two) times daily.    . carvedilol (COREG) 6.25 MG tablet Take 1 tablet (6.25 mg total) by mouth 2 (two) times daily with a meal. 60 tablet 2  . Cholecalciferol (VITAMIN D-3) 125 MCG (5000 UT) TABS Take 5,000 Units by mouth every evening.    . ciprofloxacin (CIPRO) 250 MG tablet Take 1 tablet (250 mg total) by mouth 2 (two) times daily. 10 tablet 0  . clopidogrel (PLAVIX) 75 MG tablet Take 1 tablet (75 mg total) by mouth daily with breakfast. 90 tablet 3  . co-enzyme Q-10 30 MG capsule Take 100 mg by mouth daily.    . Cranberry-Vitamin C (AZO CRANBERRY URINARY TRACT PO) Take 1 tablet by mouth daily.    . famotidine (PEPCID) 20 MG tablet Take 1 tablet (20 mg total) by mouth daily. 60 tablet 0  . ferrous sulfate 325 (65 FE) MG tablet Take 325 mg by mouth 2 (two) times daily with a meal.    . glipiZIDE (GLUCOTROL) 10 MG tablet Take 10 mg by mouth 2 (two) times daily.    [name] latanoprost (XALATAN) 0.005 % ophthalmic solution Place 1 drop into both eyes at bedtime.    [name] levothyroxine (SYNTHROID) 75 MCG tablet Take 75 mcg by mouth daily before breakfast.     . ONETOUCH ULTRA test strip 1 each 2 (two) times daily.    . polyethylene glycol (MIRALAX)  17 g packet Take 17 g by mouth daily as needed for moderate constipation. 14 each 0  . potassium chloride SA (KLOR-CON) 20 MEQ tablet Take 20 mEq by mouth 2 (two) times daily.    [name] pyridoxine (B-6) 100 MG tablet Take 100 mg by  mouth daily.    . traMADol (ULTRAM) 50 MG tablet Take 1 tablet (50 mg total) by mouth every 6 (six) hours as needed. 60 tablet 0  . vitamin E 400 UNIT capsule Take 400 Units by mouth daily.    [name] zinc gluconate 50 MG tablet Take 50 mg by mouth daily.    [name] atorvastatin (LIPITOR) 40 MG tablet Take 1 tablet (40 mg total) by mouth every evening. 90 tablet 3  . cephALEXin (KEFLEX) 250 MG capsule Take 250 mg by mouth daily.     No current facility-administered medications for this visit.    LABS/IMAGING: No results found for this or any previous visit (from the past 48 hour(s)). No results found.  VITALS: BP 112/68 (BP Location: Right Arm, Patient Position: Sitting)   Pulse (!) 57   Ht 5\' 8"  (1.727 m)   Wt 210 lb (95.3 kg)   SpO2 99%   BMI 31.93 kg/m   EXAM: General appearance: alert and no distress Lungs: clear to auscultation bilaterally Heart: regular rate and rhythm, S1, S2 normal and systolic murmur: systolic ejection 3/6, crescendo at 2nd right intercostal space Extremities: bilateral BKA's Skin: Skin color, texture, turgor normal. No rashes or lesions Neurologic: Mental status: Alert, oriented, thought content appropriate Psych: Pleasant  EKG: Sinus bradycardia first-degree AV block, RBBB at 57 personally reviewed  ASSESSMENT: 1. Chronic diastolic congestive heart failure 2. Recent PAF - CHADSVASC score of 5 (on Eliquis) 3. Moderate to severe aortic stenosis (01/2019) 4. New RBBB/bifascicular block 5. PAD status post Bilateral BKA 6. Hypertension- controlled 7. Dizziness 8. Dyslipidemia 9. Asymptomatic bradycardia   PLAN: 1.   [name] seems to be doing better after admission for recent UTIs.  He does have moderate to severe aortic stenosis.  A repeat echo would be scheduled for August.  We will plan to follow-up at that time.  Weight is fairly stable today.  His kidney function is stable and his blood pressure is well controlled.  Follow-up with me in 6  months  Pixie Casino, MD, [facility], [place] Director of the Advanced Lipid Disorders &  Cardiovascular Risk Reduction Clinic Diplomate of the American Board of Clinical Lipidology Attending Cardiologist  Direct Dial: [phone]  Fax: [phone]  Website:  [email or web address] 03/08/2020, 1:37 PM

## 2020-03-09 DIAGNOSIS — Z466 Encounter for fitting and adjustment of urinary device: Secondary | ICD-10-CM | POA: Diagnosis not present

## 2020-03-09 DIAGNOSIS — L89152 Pressure ulcer of sacral region, stage 2: Secondary | ICD-10-CM | POA: Diagnosis not present

## 2020-03-09 DIAGNOSIS — E1122 Type 2 diabetes mellitus with diabetic chronic kidney disease: Secondary | ICD-10-CM | POA: Diagnosis not present

## 2020-03-09 DIAGNOSIS — N184 Chronic kidney disease, stage 4 (severe): Secondary | ICD-10-CM | POA: Diagnosis not present

## 2020-03-09 DIAGNOSIS — E43 Unspecified severe protein-calorie malnutrition: Secondary | ICD-10-CM | POA: Diagnosis not present

## 2020-03-09 DIAGNOSIS — I130 Hypertensive heart and chronic kidney disease with heart failure and stage 1 through stage 4 chronic kidney disease, or unspecified chronic kidney disease: Secondary | ICD-10-CM | POA: Diagnosis not present

## 2020-03-09 DIAGNOSIS — I252 Old myocardial infarction: Secondary | ICD-10-CM | POA: Diagnosis not present

## 2020-03-09 DIAGNOSIS — I5032 Chronic diastolic (congestive) heart failure: Secondary | ICD-10-CM | POA: Diagnosis not present

## 2020-03-09 DIAGNOSIS — I2510 Atherosclerotic heart disease of native coronary artery without angina pectoris: Secondary | ICD-10-CM | POA: Diagnosis not present

## 2020-03-09 DIAGNOSIS — E871 Hypo-osmolality and hyponatremia: Secondary | ICD-10-CM | POA: Diagnosis not present

## 2020-03-09 DIAGNOSIS — I480 Paroxysmal atrial fibrillation: Secondary | ICD-10-CM | POA: Diagnosis not present

## 2020-03-09 DIAGNOSIS — E1165 Type 2 diabetes mellitus with hyperglycemia: Secondary | ICD-10-CM | POA: Diagnosis not present

## 2020-03-09 DIAGNOSIS — D631 Anemia in chronic kidney disease: Secondary | ICD-10-CM | POA: Diagnosis not present

## 2020-03-10 NOTE — Addendum Note (Signed)
Addended by: Orma Render on: 03/10/2020 12:16 PM   Modules accepted: Orders

## 2020-03-13 DIAGNOSIS — E7849 Other hyperlipidemia: Secondary | ICD-10-CM | POA: Diagnosis not present

## 2020-03-13 DIAGNOSIS — I4891 Unspecified atrial fibrillation: Secondary | ICD-10-CM | POA: Diagnosis not present

## 2020-03-13 DIAGNOSIS — I129 Hypertensive chronic kidney disease with stage 1 through stage 4 chronic kidney disease, or unspecified chronic kidney disease: Secondary | ICD-10-CM | POA: Diagnosis not present

## 2020-03-13 DIAGNOSIS — E1122 Type 2 diabetes mellitus with diabetic chronic kidney disease: Secondary | ICD-10-CM | POA: Diagnosis not present

## 2020-03-13 DIAGNOSIS — N183 Chronic kidney disease, stage 3 unspecified: Secondary | ICD-10-CM | POA: Diagnosis not present

## 2020-03-14 DIAGNOSIS — D631 Anemia in chronic kidney disease: Secondary | ICD-10-CM | POA: Diagnosis not present

## 2020-03-14 DIAGNOSIS — L89152 Pressure ulcer of sacral region, stage 2: Secondary | ICD-10-CM | POA: Diagnosis not present

## 2020-03-14 DIAGNOSIS — I252 Old myocardial infarction: Secondary | ICD-10-CM | POA: Diagnosis not present

## 2020-03-14 DIAGNOSIS — Z466 Encounter for fitting and adjustment of urinary device: Secondary | ICD-10-CM | POA: Diagnosis not present

## 2020-03-14 DIAGNOSIS — I480 Paroxysmal atrial fibrillation: Secondary | ICD-10-CM | POA: Diagnosis not present

## 2020-03-14 DIAGNOSIS — N184 Chronic kidney disease, stage 4 (severe): Secondary | ICD-10-CM | POA: Diagnosis not present

## 2020-03-14 DIAGNOSIS — E43 Unspecified severe protein-calorie malnutrition: Secondary | ICD-10-CM | POA: Diagnosis not present

## 2020-03-14 DIAGNOSIS — E1165 Type 2 diabetes mellitus with hyperglycemia: Secondary | ICD-10-CM | POA: Diagnosis not present

## 2020-03-14 DIAGNOSIS — I2510 Atherosclerotic heart disease of native coronary artery without angina pectoris: Secondary | ICD-10-CM | POA: Diagnosis not present

## 2020-03-14 DIAGNOSIS — E871 Hypo-osmolality and hyponatremia: Secondary | ICD-10-CM | POA: Diagnosis not present

## 2020-03-14 DIAGNOSIS — I130 Hypertensive heart and chronic kidney disease with heart failure and stage 1 through stage 4 chronic kidney disease, or unspecified chronic kidney disease: Secondary | ICD-10-CM | POA: Diagnosis not present

## 2020-03-14 DIAGNOSIS — E1122 Type 2 diabetes mellitus with diabetic chronic kidney disease: Secondary | ICD-10-CM | POA: Diagnosis not present

## 2020-03-14 DIAGNOSIS — I5032 Chronic diastolic (congestive) heart failure: Secondary | ICD-10-CM | POA: Diagnosis not present

## 2020-03-15 DIAGNOSIS — R3914 Feeling of incomplete bladder emptying: Secondary | ICD-10-CM | POA: Diagnosis not present

## 2020-03-16 DIAGNOSIS — I480 Paroxysmal atrial fibrillation: Secondary | ICD-10-CM | POA: Diagnosis not present

## 2020-03-16 DIAGNOSIS — Z466 Encounter for fitting and adjustment of urinary device: Secondary | ICD-10-CM | POA: Diagnosis not present

## 2020-03-16 DIAGNOSIS — I2510 Atherosclerotic heart disease of native coronary artery without angina pectoris: Secondary | ICD-10-CM | POA: Diagnosis not present

## 2020-03-16 DIAGNOSIS — E1165 Type 2 diabetes mellitus with hyperglycemia: Secondary | ICD-10-CM | POA: Diagnosis not present

## 2020-03-16 DIAGNOSIS — D631 Anemia in chronic kidney disease: Secondary | ICD-10-CM | POA: Diagnosis not present

## 2020-03-16 DIAGNOSIS — I252 Old myocardial infarction: Secondary | ICD-10-CM | POA: Diagnosis not present

## 2020-03-16 DIAGNOSIS — E1122 Type 2 diabetes mellitus with diabetic chronic kidney disease: Secondary | ICD-10-CM | POA: Diagnosis not present

## 2020-03-16 DIAGNOSIS — E43 Unspecified severe protein-calorie malnutrition: Secondary | ICD-10-CM | POA: Diagnosis not present

## 2020-03-16 DIAGNOSIS — L89152 Pressure ulcer of sacral region, stage 2: Secondary | ICD-10-CM | POA: Diagnosis not present

## 2020-03-16 DIAGNOSIS — I130 Hypertensive heart and chronic kidney disease with heart failure and stage 1 through stage 4 chronic kidney disease, or unspecified chronic kidney disease: Secondary | ICD-10-CM | POA: Diagnosis not present

## 2020-03-16 DIAGNOSIS — I5032 Chronic diastolic (congestive) heart failure: Secondary | ICD-10-CM | POA: Diagnosis not present

## 2020-03-16 DIAGNOSIS — E871 Hypo-osmolality and hyponatremia: Secondary | ICD-10-CM | POA: Diagnosis not present

## 2020-03-16 DIAGNOSIS — N184 Chronic kidney disease, stage 4 (severe): Secondary | ICD-10-CM | POA: Diagnosis not present

## 2020-03-20 DIAGNOSIS — N390 Urinary tract infection, site not specified: Secondary | ICD-10-CM | POA: Diagnosis not present

## 2020-03-23 DIAGNOSIS — Z466 Encounter for fitting and adjustment of urinary device: Secondary | ICD-10-CM | POA: Diagnosis not present

## 2020-03-23 DIAGNOSIS — E871 Hypo-osmolality and hyponatremia: Secondary | ICD-10-CM | POA: Diagnosis not present

## 2020-03-23 DIAGNOSIS — N184 Chronic kidney disease, stage 4 (severe): Secondary | ICD-10-CM | POA: Diagnosis not present

## 2020-03-23 DIAGNOSIS — I480 Paroxysmal atrial fibrillation: Secondary | ICD-10-CM | POA: Diagnosis not present

## 2020-03-23 DIAGNOSIS — I5032 Chronic diastolic (congestive) heart failure: Secondary | ICD-10-CM | POA: Diagnosis not present

## 2020-03-23 DIAGNOSIS — I252 Old myocardial infarction: Secondary | ICD-10-CM | POA: Diagnosis not present

## 2020-03-23 DIAGNOSIS — L89152 Pressure ulcer of sacral region, stage 2: Secondary | ICD-10-CM | POA: Diagnosis not present

## 2020-03-23 DIAGNOSIS — I130 Hypertensive heart and chronic kidney disease with heart failure and stage 1 through stage 4 chronic kidney disease, or unspecified chronic kidney disease: Secondary | ICD-10-CM | POA: Diagnosis not present

## 2020-03-23 DIAGNOSIS — E1122 Type 2 diabetes mellitus with diabetic chronic kidney disease: Secondary | ICD-10-CM | POA: Diagnosis not present

## 2020-03-23 DIAGNOSIS — I2510 Atherosclerotic heart disease of native coronary artery without angina pectoris: Secondary | ICD-10-CM | POA: Diagnosis not present

## 2020-03-23 DIAGNOSIS — E43 Unspecified severe protein-calorie malnutrition: Secondary | ICD-10-CM | POA: Diagnosis not present

## 2020-03-23 DIAGNOSIS — D631 Anemia in chronic kidney disease: Secondary | ICD-10-CM | POA: Diagnosis not present

## 2020-03-23 DIAGNOSIS — E1165 Type 2 diabetes mellitus with hyperglycemia: Secondary | ICD-10-CM | POA: Diagnosis not present

## 2020-03-24 DIAGNOSIS — E43 Unspecified severe protein-calorie malnutrition: Secondary | ICD-10-CM | POA: Diagnosis not present

## 2020-03-24 DIAGNOSIS — I252 Old myocardial infarction: Secondary | ICD-10-CM | POA: Diagnosis not present

## 2020-03-24 DIAGNOSIS — L89152 Pressure ulcer of sacral region, stage 2: Secondary | ICD-10-CM | POA: Diagnosis not present

## 2020-03-24 DIAGNOSIS — I5032 Chronic diastolic (congestive) heart failure: Secondary | ICD-10-CM | POA: Diagnosis not present

## 2020-03-24 DIAGNOSIS — I2510 Atherosclerotic heart disease of native coronary artery without angina pectoris: Secondary | ICD-10-CM | POA: Diagnosis not present

## 2020-03-24 DIAGNOSIS — N184 Chronic kidney disease, stage 4 (severe): Secondary | ICD-10-CM | POA: Diagnosis not present

## 2020-03-24 DIAGNOSIS — E1122 Type 2 diabetes mellitus with diabetic chronic kidney disease: Secondary | ICD-10-CM | POA: Diagnosis not present

## 2020-03-24 DIAGNOSIS — I130 Hypertensive heart and chronic kidney disease with heart failure and stage 1 through stage 4 chronic kidney disease, or unspecified chronic kidney disease: Secondary | ICD-10-CM | POA: Diagnosis not present

## 2020-03-24 DIAGNOSIS — I480 Paroxysmal atrial fibrillation: Secondary | ICD-10-CM | POA: Diagnosis not present

## 2020-03-24 DIAGNOSIS — E871 Hypo-osmolality and hyponatremia: Secondary | ICD-10-CM | POA: Diagnosis not present

## 2020-03-24 DIAGNOSIS — Z466 Encounter for fitting and adjustment of urinary device: Secondary | ICD-10-CM | POA: Diagnosis not present

## 2020-03-24 DIAGNOSIS — E1165 Type 2 diabetes mellitus with hyperglycemia: Secondary | ICD-10-CM | POA: Diagnosis not present

## 2020-03-24 DIAGNOSIS — D631 Anemia in chronic kidney disease: Secondary | ICD-10-CM | POA: Diagnosis not present

## 2020-03-28 ENCOUNTER — Other Ambulatory Visit: Payer: Self-pay

## 2020-03-28 ENCOUNTER — Ambulatory Visit (INDEPENDENT_AMBULATORY_CARE_PROVIDER_SITE_OTHER): Payer: Medicare Other | Admitting: Infectious Diseases

## 2020-03-28 ENCOUNTER — Telehealth: Payer: Self-pay

## 2020-03-28 ENCOUNTER — Encounter: Payer: Self-pay | Admitting: Infectious Diseases

## 2020-03-28 VITALS — BP 177/72 | HR 60 | Wt 220.0 lb

## 2020-03-28 DIAGNOSIS — I480 Paroxysmal atrial fibrillation: Secondary | ICD-10-CM | POA: Diagnosis not present

## 2020-03-28 DIAGNOSIS — Z466 Encounter for fitting and adjustment of urinary device: Secondary | ICD-10-CM | POA: Diagnosis not present

## 2020-03-28 DIAGNOSIS — E871 Hypo-osmolality and hyponatremia: Secondary | ICD-10-CM | POA: Diagnosis not present

## 2020-03-28 DIAGNOSIS — E43 Unspecified severe protein-calorie malnutrition: Secondary | ICD-10-CM | POA: Diagnosis not present

## 2020-03-28 DIAGNOSIS — E1165 Type 2 diabetes mellitus with hyperglycemia: Secondary | ICD-10-CM | POA: Diagnosis not present

## 2020-03-28 DIAGNOSIS — D631 Anemia in chronic kidney disease: Secondary | ICD-10-CM | POA: Diagnosis not present

## 2020-03-28 DIAGNOSIS — L89152 Pressure ulcer of sacral region, stage 2: Secondary | ICD-10-CM | POA: Diagnosis not present

## 2020-03-28 DIAGNOSIS — Z978 Presence of other specified devices: Secondary | ICD-10-CM | POA: Diagnosis not present

## 2020-03-28 DIAGNOSIS — N184 Chronic kidney disease, stage 4 (severe): Secondary | ICD-10-CM | POA: Diagnosis not present

## 2020-03-28 DIAGNOSIS — N390 Urinary tract infection, site not specified: Secondary | ICD-10-CM | POA: Diagnosis not present

## 2020-03-28 DIAGNOSIS — E1122 Type 2 diabetes mellitus with diabetic chronic kidney disease: Secondary | ICD-10-CM | POA: Diagnosis not present

## 2020-03-28 DIAGNOSIS — I2510 Atherosclerotic heart disease of native coronary artery without angina pectoris: Secondary | ICD-10-CM | POA: Diagnosis not present

## 2020-03-28 DIAGNOSIS — I130 Hypertensive heart and chronic kidney disease with heart failure and stage 1 through stage 4 chronic kidney disease, or unspecified chronic kidney disease: Secondary | ICD-10-CM | POA: Diagnosis not present

## 2020-03-28 DIAGNOSIS — I5032 Chronic diastolic (congestive) heart failure: Secondary | ICD-10-CM | POA: Diagnosis not present

## 2020-03-28 DIAGNOSIS — I252 Old myocardial infarction: Secondary | ICD-10-CM | POA: Diagnosis not present

## 2020-03-28 MED ORDER — NITROFURANTOIN MACROCRYSTAL 100 MG PO CAPS: 100.0000 mg | ORAL_CAPSULE | Freq: Every day | ORAL | 2 refills | Status: DC

## 2020-03-28 NOTE — Telephone Encounter (Signed)
I don't see any reason to continue the cephalexin, but can send the nitrofurantoin to his preferred pharmacy.

## 2020-03-28 NOTE — Progress Notes (Signed)
[facility]                                                             [place], [place], [place], [address]                                                                  Phn. [phone]; Fax: [phone]                                                                             Date: 03/28/20 Reason for Referral: UTI  Requesting  Provider: [employer]   Assessment 87 Y O elderly male who is accompanied by his son who also has a PMH of DM, HTN, HLD,  CHF, CKD, hypothyroidism, OSA, B/L BKA, Urinary retention s/p chronic indwelling foleys 2/2 BPH who is referred for urine cx 2/21 growing MRSA ( collected from urinary bag by patient's son) and urine cx 03/20/20  growing Pseudomonas aeruginosa( collected from urinary catheter)  Patient and son denies having any urinary symptoms as stated below after recent hospitalization in February 2022. Patient has prior urine cultures that have grown Pseudomonas aeruginosa and this seems to be a colonization and does not warrant tx with PICC line and IV antibiotics.   Can continue Nitrofurantoin for ppx purpose which he is already on Consider addimg Flomax for BPH and urinary retention, defer to PCP/Urology  Discussed regarding symptoms of UTI and proper method of collecting urinary samples when he has symptoms concerning for UTI  Fu as needed All questions and concerns were discussed and addressed. Patient verbalized understanding of the plan. ____________________________________________________________________________________________________________________  HPI: 43 Y O elderly male who is accompanied by his son who also has a PMH of DM, HTN, HLD,  CHF, CKD, hypothyroidism, OSA, B/L BKA, Urinary retention s/p chronic indwelling foleys 2/2 BPH who is referred for urine cx growing Pseudomonas aeruginosa/MRSA.  Of note, patient was recently  hospitalized for staring spells where he was thought to have CAUTI and treated with ceftazidime and ciprofloxacin. Patient is very hard of hearing and a poor historian and hence, history is obtained from the son who is also his care giver. Son says he had a urine culture done as a follow up  that was collected from his urinary bag that grew MRSA( 2/21) and he was started on Doxycyline. This was followed by another urine cx on 3/7 where he collected urine from the urinary catheter which was R to fluoroquinolones. He was started on Ciprofloxacin which he has bee taking currently.   Son says he had a foleys placed over a year ago and gets UTI every 3 months. Son denies patient having urinary specific complaints like urgency, frequency, hematuria and burning. Denies any suprapubic tenderness. Denies  any fevers, chills and sweats and flank pain. Per son, the only symptoms he gets which was thought to be related to UTI is staring spells. Patient is also taking Nitrofurantoin which was prescribed by his PCP. Foleys catheter gets replaced every 4 week per son.   Eats well, no changes in appetite or weight. Constipated and uses softeners. His children lives with him and elder son who accompanies him today is his main care giver in weekdays   ROS: Constitutional: Negative for fever, chills, activity change, appetite change, fatigue and unexpected weight change.  HENT: Negative for congestion, sore throat, rhinorrhea, sneezing, trouble swallowing and sinus pressure.  Eyes: Negative for photophobia and visual disturbance.  Respiratory: Negative for cough, chest tightness, shortness of breath, wheezing and stridor.  Cardiovascular: Negative for chest pain, palpitations and leg swelling.  Gastrointestinal: Negative for nausea, vomiting, abdominal pain, diarrhea, constipation, blood in stool, abdominal distention and anal bleeding.  Genitourinary: Negative for dysuria, hematuria, flank pain  Musculoskeletal:  Negative for myalgias, back pain, joint swelling, arthralgias and gait problem.  Skin: Negative for color change, pallor, rash and wound.  Neurological: Negative for dizziness, tremors, weakness and light-headedness.  Hematological: Negative for adenopathy. Does not bruise/bleed easily.  Psychiatric/Behavioral: Negative for behavioral problems, confusion, sleep disturbance, dysphoric mood, decreased concentration and agitation.   Past Medical History:  Diagnosis Date  . Arthritis   . Borderline diabetic   . CHF (congestive heart failure) ([facility])   . Chronic kidney disease   . Diabetes mellitus without complication ([place])   . Dysrhythmia   . Glaucoma   . Hyperlipidemia   . Hypertension   . Hypothyroidism   . Myocardial infarction ([facility])   . PVD (peripheral vascular disease) ([place])   . Sleep apnea    Cpap ordered but doesnt use  . Urinary retention due to benign prostatic hyperplasia 12/28/2018    Past Surgical History:  Procedure Laterality Date  . ABDOMINAL AORTOGRAM W/LOWER EXTREMITY N/A 07/27/2018   Procedure: ABDOMINAL AORTOGRAM W/LOWER EXTREMITY;  Surgeon: Lorretta Harp, MD;  Location: [facility];  Service: Cardiovascular;  Laterality: N/A;  . AMPUTATION Right 08/28/2018   Procedure: RIGHT BELOW KNEE AMPUTATION;  Surgeon: [name], MD;  Location: [facility];  Service: Orthopedics;  Laterality: Right;  . AMPUTATION Left 12/02/2018   Procedure: LEFT TRANSMETATARSAL AMPUTATION AND NEGATIVE PRESSURE WOUND VAC PLACEMENT;  Surgeon: [name], MD;  Location: [facility];  Service: Orthopedics;  Laterality: Left;  . AMPUTATION Left 12/18/2018   Procedure: LEFT BELOW KNEE AMPUTATION;  Surgeon: [name], MD;  Location: [facility];  Service: Orthopedics;  Laterality: Left;  . APPENDECTOMY  1943  . BELOW KNEE LEG AMPUTATION Left 12/18/2018  . CATARACT EXTRACTION  2012   x2  . COLONOSCOPY N/A 11/01/2014   Procedure: COLONOSCOPY;  Surgeon: Aviva Signs, MD;  [facility];  Service: Gastroenterology;  Laterality: N/A;  . ESOPHAGOGASTRODUODENOSCOPY N/A 11/01/2014   Procedure: ESOPHAGOGASTRODUODENOSCOPY (EGD);  Surgeon: Aviva Signs, MD;  [facility];  Service: Gastroenterology;  Laterality: N/A;  . HERNIA REPAIR Left   . INGUINAL HERNIA REPAIR Right 03/01/2013   Procedure: RIGHT INGUINAL HERNIORRHAPHY;  Surgeon: Jamesetta So, MD;  Location: [facility];  Service: General;  Laterality: Right;  . INSERTION OF MESH Right 03/01/2013   Procedure: INSERTION OF MESH;  Surgeon: Jamesetta So, MD;  [facility];  Service: General;  Laterality: Right;  . LOWER EXTREMITY ANGIOGRAPHY Right 08/25/2018   Procedure: LOWER  EXTREMITY ANGIOGRAPHY;  Surgeon: Wellington Hampshire, MD;  Location: [facility];  Service: Cardiovascular;  Laterality: Right;  . LOWER EXTREMITY ANGIOGRAPHY N/A 11/25/2018   Procedure: LOWER EXTREMITY ANGIOGRAPHY;  Surgeon: Wellington Hampshire, MD;  Location: [facility];  Service: Cardiovascular;  Laterality: N/A;  . NM MYOCAR PERF WALL MOTION  06/05/2010   no significant ischemia  . PERIPHERAL VASCULAR ATHERECTOMY Right 08/03/2018   Procedure: PERIPHERAL VASCULAR ATHERECTOMY;  Surgeon: Lorretta Harp, MD;  Location: [facility];  Service: Cardiovascular;  Laterality: Right;  . PERIPHERAL VASCULAR ATHERECTOMY Left 11/23/2018   Procedure: PERIPHERAL VASCULAR ATHERECTOMY;  Surgeon: Lorretta Harp, MD;  Location: [facility];  Service: Cardiovascular;  Laterality: Left;  sfa with dc balloon  . PERIPHERAL VASCULAR BALLOON ANGIOPLASTY Left 11/25/2018   Procedure: PERIPHERAL VASCULAR BALLOON ANGIOPLASTY;  Surgeon: Wellington Hampshire, MD;  Location: [facility];  Service: Cardiovascular;  Laterality: Left;  popliteal  . PERIPHERAL VASCULAR INTERVENTION Left 11/25/2018   Procedure: PERIPHERAL VASCULAR INTERVENTION;  Surgeon: Wellington Hampshire, MD;  Location: [facility];  Service: Cardiovascular;  Laterality:  Left;  tibial peroneal trunk and peroneal stents   . stent  06/14/2010   left leg  . STUMP REVISION Left 01/20/2019   Procedure: REVISION LEFT BELOW KNEE AMPUTATION;  Surgeon: [name], MD;  Location: [facility];  Service: Orthopedics;  Laterality: Left;  . US ECHOCARDIOGRAPHY  01/21/2006   moderate mitral annular ca+, mild MR, AOV moderately sclerotic.   Current Outpatient Medications on File Prior to Visit  Medication Sig Dispense Refill  . ciprofloxacin (CIPRO) 250 MG tablet Take 1 tablet (250 mg total) by mouth 2 (two) times daily. 10 tablet 0  . acetaminophen (TYLENOL) 500 MG tablet Take 500 mg by mouth every 6 (six) hours as needed for headache (pain).    [name] amLODipine (NORVASC) 10 MG tablet Take 1 tablet (10 mg total) by mouth daily. 90 tablet 3  . apixaban (ELIQUIS) 5 MG TABS tablet Take 2.5 mg by mouth daily.    . Ascorbic Acid (VITAMIN C) 1000 MG tablet Take 1,000 mg by mouth daily.     [name] atorvastatin (LIPITOR) 40 MG tablet Take 1 tablet (40 mg total) by mouth every evening. 90 tablet 3  . betamethasone dipropionate 0.05 % cream Apply 1 application topically 2 (two) times daily.    . carvedilol (COREG) 6.25 MG tablet Take 1 tablet (6.25 mg total) by mouth 2 (two) times daily with a meal. 60 tablet 2  . cephALEXin (KEFLEX) 250 MG capsule Take 250 mg by mouth daily.    . Cholecalciferol (VITAMIN D-3) 125 MCG (5000 UT) TABS Take 5,000 Units by mouth every evening.    . ciprofloxacin (CIPRO) 500 MG tablet Take 500 mg by mouth 2 (two) times daily.    . clopidogrel (PLAVIX) 75 MG tablet Take 1 tablet (75 mg total) by mouth daily with breakfast. 90 tablet 3  . co-enzyme Q-10 30 MG capsule Take 100 mg by mouth daily.    . Cranberry-Vitamin C (AZO CRANBERRY URINARY TRACT PO) Take 1 tablet by mouth daily.    . famotidine (PEPCID) 20 MG tablet Take 1 tablet (20 mg total) by mouth daily. 60 tablet 0  . ferrous sulfate 325 (65 FE) MG tablet Take 325 mg by mouth 2 (two) times daily with a meal.     . glipiZIDE (GLUCOTROL) 10 MG tablet Take 10 mg by mouth 2 (two)  times daily.    [name] latanoprost (XALATAN) 0.005 % ophthalmic solution Place 1 drop into both eyes at bedtime.    [name] levothyroxine (SYNTHROID) 75 MCG tablet Take 75 mcg by mouth daily before breakfast.     . nitrofurantoin (MACRODANTIN) 100 MG capsule Take 100 mg by mouth 2 (two) times daily.    Glory Rosebush ULTRA test strip 1 each 2 (two) times daily.    . polyethylene glycol (MIRALAX) 17 g packet Take 17 g by mouth daily as needed for moderate constipation. 14 each 0  . potassium chloride SA (KLOR-CON) 20 MEQ tablet Take 20 mEq by mouth 2 (two) times daily.    [name] pyridoxine (B-6) 100 MG tablet Take 100 mg by mouth daily.    . traMADol (ULTRAM) 50 MG tablet Take 1 tablet (50 mg total) by mouth every 6 (six) hours as needed. 60 tablet 0  . vitamin E 400 UNIT capsule Take 400 Units by mouth daily.    [name] zinc gluconate 50 MG tablet Take 50 mg by mouth daily.     No current facility-administered medications on file prior to visit.   Allergies  Allergen Reactions  . Iodinated Diagnostic Agents Other (See Comments)    caused Fever  . Dye Fdc Red [Red Dye] Hives    Tolerated red Docusate, Niferex    Social History   Socioeconomic History  . Marital status: Widowed    Spouse name: Not on file  . Number of children: 3  . Years of education: 23 th  . Highest education level: Not on file  Occupational History  . Occupation: Retired Psychologist, sport and exercise  Tobacco Use  . Smoking status: Former Smoker    Quit date: 01/13/1965    Years since quitting: 55.2  . Smokeless tobacco: Former Systems developer    Types: Secondary school teacher  . Vaping Use: Never used  Substance and Sexual Activity  . Alcohol use: No  . Drug use: No  . Sexual activity: Not Currently  Other Topics Concern  . Not on file  Social History Narrative    Has 2 children! Very supportive.    Social Determinants of Health   Financial Resource Strain: Not on file  Food Insecurity: No Food  Insecurity  . Worried About Charity fundraiser in the Last Year: Never true  . Ran Out of Food in the Last Year: Never true  Transportation Needs: No Transportation Needs  . Lack of Transportation (Medical): No  . Lack of Transportation (Non-Medical): No  Physical Activity: Not on file  Stress: Not on file  Social Connections: Not on file  Intimate Partner Violence: Not on file    Vitals BP (!) 177/72   Pulse 60   Wt 220 lb (99.8 kg)   BMI 33.45 kg/m    Examination  General - not in acute distress, comfortably sitting in chair, HARD OF HEARING  HEENT - PEERLA, no pallor and no icterus Chest - b/l clear air entry, no additional sounds CVS- LOUD GRADE 3/4 SYSTOLIC MURMUR, RRR Abdomen - Soft, Non tender , non distended BL BKA Neuro: grossly normal Back - WNL Psych : calm and cooperative   Recent labs CBC Latest Ref Rng & Units 02/24/2020 12/13/2019 12/08/2019  WBC 4.0 - 10.5 K/uL 9.7 12.1(H) 11.7(H)  Hemoglobin 13.0 - 17.0 g/dL 13.5 11.9(L) 11.7(L)  Hematocrit 39.0 - 52.0 % 40.9 35.8(L) 35.3(L)  Platelets 150 - 400 K/uL 242 257 191   CMP Latest Ref Rng & Units 02/26/2020  02/25/2020 02/24/2020  Glucose 70 - 99 mg/dL 152(H) 132(H) 224(H)  BUN 8 - 23 mg/dL 34(H) 35(H) 35(H)  Creatinine 0.61 - 1.24 mg/dL 2.54(H) 2.64(H) 2.53(H)  Sodium 135 - 145 mmol/L 137 136 135  Potassium 3.5 - 5.1 mmol/L 3.4(L) 3.4(L) 3.7  Chloride 98 - 111 mmol/L 105 101 97(L)  CO2 22 - 32 mmol/L 23 25 26   Calcium 8.9 - 10.3 mg/dL 8.5(L) 9.0 9.4  Total Protein 6.5 - 8.1 g/dL - - 7.7  Total Bilirubin 0.3 - 1.2 mg/dL - - 1.0  Alkaline Phos 38 - 126 U/L - - 46  AST 15 - 41 U/L - - 25  ALT 0 - 44 U/L - - 21      Pertinent Microbiology Results for orders placed or performed during the hospital encounter of 02/24/20  Urine culture     Status: Abnormal   Collection Time: 02/24/20  2:52 PM   Specimen: Urine, Catheterized  Result Value Ref Range Status   Specimen Description   Final    URINE,  CATHETERIZED Performed at [facility], [address] [place], [place] [address]    Special Requests   Final    NONE Performed at [facility], [address], [place], [place] [address]    Culture >=100,000 COLONIES/mL PSEUDOMONAS AERUGINOSA (A)  Final   Report Status 02/27/2020 FINAL  Final   Organism ID, Bacteria PSEUDOMONAS AERUGINOSA (A)  Final      Susceptibility   Pseudomonas aeruginosa - MIC*    CEFTAZIDIME 4 SENSITIVE Sensitive     CIPROFLOXACIN 0.5 SENSITIVE Sensitive     GENTAMICIN 4 SENSITIVE Sensitive     IMIPENEM 1 SENSITIVE Sensitive     * >=100,000 COLONIES/mL PSEUDOMONAS AERUGINOSA  SARS CORONAVIRUS 2 (TAT 6-24 HRS) Nasopharyngeal Nasopharyngeal Swab     Status: None   Collection Time: 02/24/20  8:05 PM   Specimen: Nasopharyngeal Swab  Result Value Ref Range Status   SARS Coronavirus 2 NEGATIVE NEGATIVE Final    Comment: (NOTE) SARS-CoV-2 target nucleic acids are NOT DETECTED.  The SARS-CoV-2 RNA is generally detectable in upper and lower respiratory specimens during the acute phase of infection. Negative results do not preclude SARS-CoV-2 infection, do not rule out co-infections with other pathogens, and should not be used as the sole basis for treatment or other patient management decisions. Negative results must be combined with clinical observations, patient history, and epidemiological information. The expected result is Negative.  Fact Sheet for Patients: SugarRoll.be  Fact Sheet for Healthcare Providers: https://www.woods-mathews.com/  This test is not yet approved or cleared by the [place] FDA and  has been authorized for detection and/or diagnosis of SARS-CoV-2 by FDA under an Emergency Use Authorization (EUA). This EUA will remain  in effect (meaning this test can be used) for the duration of the COVID-19 declaration under Se ction 564(b)(1) of the Act, 21 U.S.C. section 360bbb-3(b)(1), unless the  authorization is terminated or revoked sooner.  Performed at [facility], [place] [address], [place], [place] [address]       Pertinent Imaging All pertinent labs/Imagings/notes reviewed. All pertinent plain films and CT images have been personally visualized and interpreted; radiology reports have been reviewed. Decision making incorporated into the Impression / Recommendations.  I spent 30 minutes with the patient including  review of prior medical records with greater than 50% of time in face to face counsel of the patient.    Electronically signed by:  Rosiland Oz, MD Infectious Disease Physician  [facility] [address] [place],  [address] Phone: [phone]  Fax: [phone]

## 2020-03-28 NOTE — Telephone Encounter (Signed)
Received call from patient's son wanting to update Dr. West Bali with the additional information she requested. He states the patient is taking cephalexin 250 mg 1 capsule by mouth at night and nitrofurantoin 100 mg capsule twice a day - but that the patient only has a couple of capsules of nitrofurantoin left. Will route to provider.   Beryle Flock, RN

## 2020-03-28 NOTE — Addendum Note (Signed)
Addended by: Rosiland Oz on: 03/28/2020 05:16 PM   Modules accepted: Orders

## 2020-03-29 NOTE — Telephone Encounter (Signed)
RN spoke with patient's son to relay per Dr. West Bali that she does not see any reason for the patient to continue the cephalexin. Advised patient's son that additional nitrofurantoin has been sent to the pharmacy. Patient's son verbalized understanding.   He is wondering if patient should just be taking nitrofurantoin once a day, as he was previously taking it twice a day. He noticed the instructions say to take at bedtime, but the patient was previously taking the antibiotic with food and he is wondering if the patient should take with dinner instead.  Patient's son asking what the next steps are, as they will run out of the nitrofurantoin in 3 months. He is wondering if refills will be provided or if his father will need to be seen again around that time. He is concerned because his father seems to get UTIs approximately every 3 months. Will route to provider.   Beryle Flock, RN

## 2020-03-29 NOTE — Telephone Encounter (Signed)
He can take the nitrofurantoin once daily before bedtime. He can see his PCP to get refills on the nitrofurantoin. Thanks.

## 2020-03-29 NOTE — Telephone Encounter (Signed)
RN spoke with patient's son to relay per Dr. West Bali that patient can take the nitrofurantoin once daily before bedtime, and that if patient needs refills to consult his primary care provider. Patient's son states that the patient tolerates the antibiotic better with meals. RN confirmed with Dr. West Bali that patient can take medication with meals and relayed this to patient's son. He verbalized understanding and has no further questions.    Beryle Flock, RN

## 2020-03-30 DIAGNOSIS — I130 Hypertensive heart and chronic kidney disease with heart failure and stage 1 through stage 4 chronic kidney disease, or unspecified chronic kidney disease: Secondary | ICD-10-CM | POA: Diagnosis not present

## 2020-03-30 DIAGNOSIS — E871 Hypo-osmolality and hyponatremia: Secondary | ICD-10-CM | POA: Diagnosis not present

## 2020-03-30 DIAGNOSIS — I2510 Atherosclerotic heart disease of native coronary artery without angina pectoris: Secondary | ICD-10-CM | POA: Diagnosis not present

## 2020-03-30 DIAGNOSIS — E43 Unspecified severe protein-calorie malnutrition: Secondary | ICD-10-CM | POA: Diagnosis not present

## 2020-03-30 DIAGNOSIS — Z466 Encounter for fitting and adjustment of urinary device: Secondary | ICD-10-CM | POA: Diagnosis not present

## 2020-03-30 DIAGNOSIS — E1165 Type 2 diabetes mellitus with hyperglycemia: Secondary | ICD-10-CM | POA: Diagnosis not present

## 2020-03-30 DIAGNOSIS — I480 Paroxysmal atrial fibrillation: Secondary | ICD-10-CM | POA: Diagnosis not present

## 2020-03-30 DIAGNOSIS — D631 Anemia in chronic kidney disease: Secondary | ICD-10-CM | POA: Diagnosis not present

## 2020-03-30 DIAGNOSIS — N184 Chronic kidney disease, stage 4 (severe): Secondary | ICD-10-CM | POA: Diagnosis not present

## 2020-03-30 DIAGNOSIS — L89152 Pressure ulcer of sacral region, stage 2: Secondary | ICD-10-CM | POA: Diagnosis not present

## 2020-03-30 DIAGNOSIS — E1122 Type 2 diabetes mellitus with diabetic chronic kidney disease: Secondary | ICD-10-CM | POA: Diagnosis not present

## 2020-03-30 DIAGNOSIS — I252 Old myocardial infarction: Secondary | ICD-10-CM | POA: Diagnosis not present

## 2020-03-30 DIAGNOSIS — I5032 Chronic diastolic (congestive) heart failure: Secondary | ICD-10-CM | POA: Diagnosis not present

## 2020-03-31 DIAGNOSIS — E871 Hypo-osmolality and hyponatremia: Secondary | ICD-10-CM | POA: Diagnosis not present

## 2020-03-31 DIAGNOSIS — E1122 Type 2 diabetes mellitus with diabetic chronic kidney disease: Secondary | ICD-10-CM | POA: Diagnosis not present

## 2020-03-31 DIAGNOSIS — L89152 Pressure ulcer of sacral region, stage 2: Secondary | ICD-10-CM | POA: Diagnosis not present

## 2020-03-31 DIAGNOSIS — I480 Paroxysmal atrial fibrillation: Secondary | ICD-10-CM | POA: Diagnosis not present

## 2020-03-31 DIAGNOSIS — E43 Unspecified severe protein-calorie malnutrition: Secondary | ICD-10-CM | POA: Diagnosis not present

## 2020-03-31 DIAGNOSIS — E1165 Type 2 diabetes mellitus with hyperglycemia: Secondary | ICD-10-CM | POA: Diagnosis not present

## 2020-03-31 DIAGNOSIS — I5032 Chronic diastolic (congestive) heart failure: Secondary | ICD-10-CM | POA: Diagnosis not present

## 2020-03-31 DIAGNOSIS — I130 Hypertensive heart and chronic kidney disease with heart failure and stage 1 through stage 4 chronic kidney disease, or unspecified chronic kidney disease: Secondary | ICD-10-CM | POA: Diagnosis not present

## 2020-03-31 DIAGNOSIS — I252 Old myocardial infarction: Secondary | ICD-10-CM | POA: Diagnosis not present

## 2020-03-31 DIAGNOSIS — N184 Chronic kidney disease, stage 4 (severe): Secondary | ICD-10-CM | POA: Diagnosis not present

## 2020-03-31 DIAGNOSIS — I2510 Atherosclerotic heart disease of native coronary artery without angina pectoris: Secondary | ICD-10-CM | POA: Diagnosis not present

## 2020-03-31 DIAGNOSIS — Z466 Encounter for fitting and adjustment of urinary device: Secondary | ICD-10-CM | POA: Diagnosis not present

## 2020-03-31 DIAGNOSIS — D631 Anemia in chronic kidney disease: Secondary | ICD-10-CM | POA: Diagnosis not present

## 2020-04-03 ENCOUNTER — Other Ambulatory Visit: Payer: Self-pay | Admitting: *Deleted

## 2020-04-03 DIAGNOSIS — D638 Anemia in other chronic diseases classified elsewhere: Secondary | ICD-10-CM | POA: Diagnosis not present

## 2020-04-03 DIAGNOSIS — N189 Chronic kidney disease, unspecified: Secondary | ICD-10-CM | POA: Diagnosis not present

## 2020-04-03 DIAGNOSIS — R809 Proteinuria, unspecified: Secondary | ICD-10-CM | POA: Diagnosis not present

## 2020-04-03 DIAGNOSIS — E1129 Type 2 diabetes mellitus with other diabetic kidney complication: Secondary | ICD-10-CM | POA: Diagnosis not present

## 2020-04-03 DIAGNOSIS — E1122 Type 2 diabetes mellitus with diabetic chronic kidney disease: Secondary | ICD-10-CM | POA: Diagnosis not present

## 2020-04-03 NOTE — Patient Outreach (Signed)
[facility]) Care Management  04/03/2020  KAZDEN LARGO 1927-09-26 [id number]   Outgoing call placed to [facility] son/caregiver.  Report member has recently been "stuffy with a cough" but does not have any other symptoms (denies fever, chills, or body aches).  He has completed course of antibiotics from hospitalization with UTI and followed up last week with ID specialist.  State member has diagnosis of UTI about every 3 months, now on Macrodantin for prophylactic therapy.  Report member is still being seen by home health RN for sacral wound, feels it continuously gets better then worse, up and down, but doing well currently.  Blood sugars have recently been managed, readings over the last few days have bene 166, 113, and 101.  Denies any urgent concerns, encouraged to contact this care manager with questions.  Agrees to follow up within the next month.  Goals Addressed            This Visit's Progress   . [facility] - Monitor and Manage My Blood Sugar   On track    Follow Up Date 05/01/2020  Timeframe:  Short-Term Goal Priority:  Medium Start Date:          3/21         Expected End Date:   4/21                  - check blood sugar at prescribed times - check blood sugar if I feel it is too high or too low - take the blood sugar log to all doctor visits    Why is this important?   Checking your blood sugar at home helps to keep it from getting very high or very low.  Writing the results in a diary or log helps the doctor know how to care for you.  Your blood sugar log should have the time, date and the results.  Also, write down the amount of insulin or other medicine that you take.  Other information, like what you ate, exercise done and how you were feeling, will also be helpful.     Notes:   11/18 - blood sugar readings reviewed, discussed compliance with medication regime  12/28 - Discussed proper diabetic and low sodium diet  02/11/2020 - Remind of importance of  managing blood sugars in correlation to wound healing  2/21 - Confirmed member has ongoing participation with home health for weekly monitoring  3/21 - Encouraged to continue daily blood sugar monitoring as well as recording readings    . [facility] - Set My Target A1C   On track    Follow Up Date 04/03/2020  Timeframe:  Long-Range Goal Priority:  Medium Start Date:       2/21                      Expected End Date:   06/10/2020                      - set target A1C (7)    Why is this important?   Your target A1C is decided together by you and your doctor.  It is based on several things like your age and other health issues.    Notes:   11/18 - Discussed dose change for oral diabetic agents  12/28 - Call placed to PCP office requesting order for new A1C lab  1/28 - Re-educated on DM management in effort to decrease A1C  2/21 - Review CBG trends, encouraged to continue plan of care  3/21 - Reminded son that controlled CBG's will promote wound healing    . COMPLETED: [facility] -Improve My Heart Health       Follow Up Date 04/03/2020  Timeframe:  Short-Term Goal Priority:  High Start Date:    03/06/2020                         Expected End Date:       04/10/2020                  - learn about small changes that will make a big difference - learn my personal risk factors    Why is this important?   Lifestyle changes are key to improving the blood flow to your heart. Think about the things you can change and set a goal to live healthy.  Remember, when the blood vessels to your heart start to get clogged you may not have any symptoms.  Over time, they can get worse.  Don't ignore the signs, like chest pain, and get help right away.     Notes:   11/18 - discussed antihypertensive dose change and BP trends  12/28 - Reviewed BP trends since medication changes, encouraged to continue to monitor  1/28 - Re-educated son on proper diet to promote heart health      [name], RN,  MSN [place] Manager [phone]

## 2020-04-04 DIAGNOSIS — Z466 Encounter for fitting and adjustment of urinary device: Secondary | ICD-10-CM | POA: Diagnosis not present

## 2020-04-04 DIAGNOSIS — E1122 Type 2 diabetes mellitus with diabetic chronic kidney disease: Secondary | ICD-10-CM | POA: Diagnosis not present

## 2020-04-04 DIAGNOSIS — I5032 Chronic diastolic (congestive) heart failure: Secondary | ICD-10-CM | POA: Diagnosis not present

## 2020-04-04 DIAGNOSIS — I130 Hypertensive heart and chronic kidney disease with heart failure and stage 1 through stage 4 chronic kidney disease, or unspecified chronic kidney disease: Secondary | ICD-10-CM | POA: Diagnosis not present

## 2020-04-04 DIAGNOSIS — E1165 Type 2 diabetes mellitus with hyperglycemia: Secondary | ICD-10-CM | POA: Diagnosis not present

## 2020-04-04 DIAGNOSIS — L89152 Pressure ulcer of sacral region, stage 2: Secondary | ICD-10-CM | POA: Diagnosis not present

## 2020-04-04 DIAGNOSIS — E43 Unspecified severe protein-calorie malnutrition: Secondary | ICD-10-CM | POA: Diagnosis not present

## 2020-04-04 DIAGNOSIS — I2510 Atherosclerotic heart disease of native coronary artery without angina pectoris: Secondary | ICD-10-CM | POA: Diagnosis not present

## 2020-04-04 DIAGNOSIS — I252 Old myocardial infarction: Secondary | ICD-10-CM | POA: Diagnosis not present

## 2020-04-04 DIAGNOSIS — D631 Anemia in chronic kidney disease: Secondary | ICD-10-CM | POA: Diagnosis not present

## 2020-04-04 DIAGNOSIS — I480 Paroxysmal atrial fibrillation: Secondary | ICD-10-CM | POA: Diagnosis not present

## 2020-04-04 DIAGNOSIS — N184 Chronic kidney disease, stage 4 (severe): Secondary | ICD-10-CM | POA: Diagnosis not present

## 2020-04-04 DIAGNOSIS — E871 Hypo-osmolality and hyponatremia: Secondary | ICD-10-CM | POA: Diagnosis not present

## 2020-04-05 DIAGNOSIS — I129 Hypertensive chronic kidney disease with stage 1 through stage 4 chronic kidney disease, or unspecified chronic kidney disease: Secondary | ICD-10-CM | POA: Diagnosis not present

## 2020-04-05 DIAGNOSIS — R809 Proteinuria, unspecified: Secondary | ICD-10-CM | POA: Diagnosis not present

## 2020-04-05 DIAGNOSIS — I5032 Chronic diastolic (congestive) heart failure: Secondary | ICD-10-CM | POA: Diagnosis not present

## 2020-04-05 DIAGNOSIS — N189 Chronic kidney disease, unspecified: Secondary | ICD-10-CM | POA: Diagnosis not present

## 2020-04-05 DIAGNOSIS — D638 Anemia in other chronic diseases classified elsewhere: Secondary | ICD-10-CM | POA: Diagnosis not present

## 2020-04-05 DIAGNOSIS — E1122 Type 2 diabetes mellitus with diabetic chronic kidney disease: Secondary | ICD-10-CM | POA: Diagnosis not present

## 2020-04-05 DIAGNOSIS — E876 Hypokalemia: Secondary | ICD-10-CM | POA: Diagnosis not present

## 2020-04-05 DIAGNOSIS — E1129 Type 2 diabetes mellitus with other diabetic kidney complication: Secondary | ICD-10-CM | POA: Diagnosis not present

## 2020-04-06 DIAGNOSIS — E1122 Type 2 diabetes mellitus with diabetic chronic kidney disease: Secondary | ICD-10-CM | POA: Diagnosis not present

## 2020-04-06 DIAGNOSIS — E43 Unspecified severe protein-calorie malnutrition: Secondary | ICD-10-CM | POA: Diagnosis not present

## 2020-04-06 DIAGNOSIS — Z466 Encounter for fitting and adjustment of urinary device: Secondary | ICD-10-CM | POA: Diagnosis not present

## 2020-04-06 DIAGNOSIS — E1165 Type 2 diabetes mellitus with hyperglycemia: Secondary | ICD-10-CM | POA: Diagnosis not present

## 2020-04-06 DIAGNOSIS — N184 Chronic kidney disease, stage 4 (severe): Secondary | ICD-10-CM | POA: Diagnosis not present

## 2020-04-06 DIAGNOSIS — I480 Paroxysmal atrial fibrillation: Secondary | ICD-10-CM | POA: Diagnosis not present

## 2020-04-06 DIAGNOSIS — L89152 Pressure ulcer of sacral region, stage 2: Secondary | ICD-10-CM | POA: Diagnosis not present

## 2020-04-06 DIAGNOSIS — I5032 Chronic diastolic (congestive) heart failure: Secondary | ICD-10-CM | POA: Diagnosis not present

## 2020-04-06 DIAGNOSIS — D631 Anemia in chronic kidney disease: Secondary | ICD-10-CM | POA: Diagnosis not present

## 2020-04-06 DIAGNOSIS — I252 Old myocardial infarction: Secondary | ICD-10-CM | POA: Diagnosis not present

## 2020-04-06 DIAGNOSIS — E871 Hypo-osmolality and hyponatremia: Secondary | ICD-10-CM | POA: Diagnosis not present

## 2020-04-06 DIAGNOSIS — I130 Hypertensive heart and chronic kidney disease with heart failure and stage 1 through stage 4 chronic kidney disease, or unspecified chronic kidney disease: Secondary | ICD-10-CM | POA: Diagnosis not present

## 2020-04-06 DIAGNOSIS — I2510 Atherosclerotic heart disease of native coronary artery without angina pectoris: Secondary | ICD-10-CM | POA: Diagnosis not present

## 2020-04-10 DIAGNOSIS — L89152 Pressure ulcer of sacral region, stage 2: Secondary | ICD-10-CM | POA: Diagnosis not present

## 2020-04-10 DIAGNOSIS — E1122 Type 2 diabetes mellitus with diabetic chronic kidney disease: Secondary | ICD-10-CM | POA: Diagnosis not present

## 2020-04-10 DIAGNOSIS — I480 Paroxysmal atrial fibrillation: Secondary | ICD-10-CM | POA: Diagnosis not present

## 2020-04-10 DIAGNOSIS — N184 Chronic kidney disease, stage 4 (severe): Secondary | ICD-10-CM | POA: Diagnosis not present

## 2020-04-10 DIAGNOSIS — Z466 Encounter for fitting and adjustment of urinary device: Secondary | ICD-10-CM | POA: Diagnosis not present

## 2020-04-10 DIAGNOSIS — I2510 Atherosclerotic heart disease of native coronary artery without angina pectoris: Secondary | ICD-10-CM | POA: Diagnosis not present

## 2020-04-10 DIAGNOSIS — I130 Hypertensive heart and chronic kidney disease with heart failure and stage 1 through stage 4 chronic kidney disease, or unspecified chronic kidney disease: Secondary | ICD-10-CM | POA: Diagnosis not present

## 2020-04-10 DIAGNOSIS — E43 Unspecified severe protein-calorie malnutrition: Secondary | ICD-10-CM | POA: Diagnosis not present

## 2020-04-10 DIAGNOSIS — E1165 Type 2 diabetes mellitus with hyperglycemia: Secondary | ICD-10-CM | POA: Diagnosis not present

## 2020-04-10 DIAGNOSIS — E871 Hypo-osmolality and hyponatremia: Secondary | ICD-10-CM | POA: Diagnosis not present

## 2020-04-10 DIAGNOSIS — D631 Anemia in chronic kidney disease: Secondary | ICD-10-CM | POA: Diagnosis not present

## 2020-04-10 DIAGNOSIS — I252 Old myocardial infarction: Secondary | ICD-10-CM | POA: Diagnosis not present

## 2020-04-10 DIAGNOSIS — I5032 Chronic diastolic (congestive) heart failure: Secondary | ICD-10-CM | POA: Diagnosis not present

## 2020-04-11 ENCOUNTER — Other Ambulatory Visit: Payer: Self-pay

## 2020-04-11 ENCOUNTER — Other Ambulatory Visit: Payer: Medicare Other | Admitting: Nurse Practitioner

## 2020-04-11 DIAGNOSIS — Z515 Encounter for palliative care: Secondary | ICD-10-CM

## 2020-04-11 DIAGNOSIS — N390 Urinary tract infection, site not specified: Secondary | ICD-10-CM

## 2020-04-11 NOTE — Progress Notes (Signed)
[facility] Consult Note Telephone: [phone]  Fax: [phone]  PATIENT NAME: [name] [address] [place] [address] [phone] (home)  DOB: 05-19-1927 MRN: [id number]  PRIMARY CARE PROVIDER:    Redmond School, MD,  [address] [place] [address] [phone]  REFERRING PROVIDER:   Redmond School, [place],  [place] [address] [phone]  RESPONSIBLE PARTY:   Extended Emergency Contact Information Primary Emergency Contact: [name] [address] [name] of [place] Phone: [phone] Relation: Son Secondary Emergency Contact: [name] Address: [address]          [place], [place] [address] [name] of [place] Mobile Phone: [phone] Relation: Daughter  I met face to face with patient in home, son [name] present at visit.  ASSESSMENT AND RECOMMENDATIONS:   Advance Care Planning: Goal of care: Patient's goal of care is function. Directives: Patient desires CPR but not intubation. Family not ready to complete MOST form at this time. Will complete form when family is ready.  Symptom Management:  Recurrent UTI: Patient seen by infectious disease, started on Nitrofurantoin 169m daily for long term use as prophylaxis for UTI. Patient denied fever, denied chill, denied abdominal pain. Urine is yellow and clear. Family report good appetite, no report of insomnia, no report of uncontrolled pain.  Foley cath changed monthly, next change scheduled for tomorrow. Chronic kidney disease stage CKD 4: Condition is ongoing, GFR 25, protein/creainine ratio: 946 down from 1202, Hgb 13.5 on 02/24/2020. Monitor for outward symptoms of worsening CKD such as changes in urine output, uremia, and swelling. Renal adjust medication as indicated.  Questions and concerns were addressed. Patient and family was encouraged to call with questions and/or concerns. Provided  general support and encouragement, no other unmet needs identified at this time.  Follow up Palliative Care Visit: Palliative care will continue to follow for complex decision making and symptom management. Return in about 4 weeks or prn.  Family /Caregiver/Community Supports: Patient is a widow, lives at home, lives at home. His children take turns to care for him.   Cognitive / Functional decline: Patient awake and alert, hard of hearing, uses prosthesis due to bilateral BKA, dependent on family for his ADLs, able to feed self. He is continent of bowel, has chronic foley cath. He is mostly wheelchair dependent for ambulation, no report of falls.  I spent 30 minutes providing this consultation. More than 50% of the time in this consultation was spent counseling and coordinating communication.   CHIEF COMPLAINT: follow up palliative care visit  History obtained from review of EMR and discussion with patient and family. Records reviewed and summarized bellow.  HISTORY OF PRESENT ILLNESS:[name] a 87y.o.year old malewith multiple medical problems including CKD 4, chronic A-fib, moderate aortic stenosis, hypothyroidism, chronic diastolic heart failure (EF 55-60% in 08/2019), BPH, Type 2 diabetes (A1c 7.5), HTN, PAD s/p bilateral BKA. Patient has recent NSTEMI in October 2021.Patient s/p hospitalization 02/24/2020 to 02/27/2020 for UTI, patient now on chronic antibiotics (Nitrofurantoin) for UTIPalliative Care was asked to follow this patient by consultation request ofFusco, LPurcell Nails MDto help address advance care planning and complex decision making.   CODE STATUS: CPR/ no intubation  PPS: 40%  HOSPICE ELIGIBILITY/DIAGNOSIS: TBD  ROS/patient Constitutional: denies fever, denies chills EYES: denies acute vision changes ENMT: denies dysphagia Cardiovascular: denies chest pain, denies palpitation Pulmonary: denies cough, denies increased SOB Abdomen: endorses  good appetite,  denies constipation, denies incontinence of bowel GU: denies dysuria MSK:  endorses ROM limitations, no falls reported Skin: denies rashes or wounds Neurological: endorses weakness, denies pain, denies insomnia Psych: Endorses positive mood Heme/lymph/immuno: denies bruises, abnormal bleeding   Physical Exam: Vital signs: BP 122/70 (right arm), 138/70 (left arm), P 52, RR 16, 97% on room air General: frail appearing, cooperative, sitting in wheelchair in NAD CV:  no LE edema Pulmonary: no increased work of breathing, no cough, no audible wheezes, room air Abdomen:  no ascites GU: Foley cath intact and patent, urine clear and yellow MSK: Bilateral BKA Skin: warm and dry, no rashes or wounds on visible skin, ecchymotic areas noted lateral side of right arm. Neuro: Generalized weakness, A and O x 3 Psych: non-anxious affect today Hem/lymph/immuno: no widespread bruising   PAST MEDICAL HISTORY:  Past Medical History:  Diagnosis Date  . Arthritis   . Borderline diabetic   . CHF (congestive heart failure) ([facility])   . Chronic kidney disease   . Diabetes mellitus without complication ([facility])   . Dysrhythmia   . Glaucoma   . Hyperlipidemia   . Hypertension   . Hypothyroidism   . Myocardial infarction ([facility])   . PVD (peripheral vascular disease) ([facility])   . Sleep apnea    Cpap ordered but doesnt use  . Urinary retention due to benign prostatic hyperplasia 12/28/2018    SOCIAL HX:  Social History   Tobacco Use  . Smoking status: Former Smoker    Quit date: 01/13/1965    Years since quitting: 55.2  . Smokeless tobacco: Former Systems developer    Types: Chew  Substance Use Topics  . Alcohol use: No   FAMILY HX:  Family History  Problem Relation Age of Onset  . Cancer Mother   . Heart attack Father   . Stroke Father   . Heart attack Brother   . Heart attack Brother     ALLERGIES:  Allergies  Allergen Reactions  . Iodinated Diagnostic Agents Other (See Comments)    caused Fever  .  Dye Fdc Red [Red Dye] Hives    Tolerated red Docusate, Niferex     PERTINENT MEDICATIONS:  Outpatient Encounter Medications as of 04/11/2020  Medication Sig  . acetaminophen (TYLENOL) 500 MG tablet Take 500 mg by mouth every 6 (six) hours as needed for headache (pain).  [name] amLODipine (NORVASC) 10 MG tablet Take 1 tablet (10 mg total) by mouth daily.  [name] apixaban (ELIQUIS) 5 MG TABS tablet Take 2.5 mg by mouth daily.  . Ascorbic Acid (VITAMIN C) 1000 MG tablet Take 1,000 mg by mouth daily.   [name] atorvastatin (LIPITOR) 40 MG tablet Take 1 tablet (40 mg total) by mouth every evening.  . betamethasone dipropionate 0.05 % cream Apply 1 application topically 2 (two) times daily.  . carvedilol (COREG) 6.25 MG tablet Take 1 tablet (6.25 mg total) by mouth 2 (two) times daily with a meal.  . cephALEXin (KEFLEX) 250 MG capsule Take 250 mg by mouth daily.  . Cholecalciferol (VITAMIN D-3) 125 MCG (5000 UT) TABS Take 5,000 Units by mouth every evening.  . clopidogrel (PLAVIX) 75 MG tablet Take 1 tablet (75 mg total) by mouth daily with breakfast.  . co-enzyme Q-10 30 MG capsule Take 100 mg by mouth daily.  . Cranberry-Vitamin C (AZO CRANBERRY URINARY TRACT PO) Take 1 tablet by mouth daily.  . famotidine (PEPCID) 20 MG tablet Take 1 tablet (20 mg total) by mouth daily.  [name]  ferrous sulfate 325 (65 FE) MG tablet Take 325 mg by mouth 2 (two) times daily with a meal.  . glipiZIDE (GLUCOTROL) 10 MG tablet Take 10 mg by mouth 2 (two) times daily.  [name] latanoprost (XALATAN) 0.005 % ophthalmic solution Place 1 drop into both eyes at bedtime.  [name] levothyroxine (SYNTHROID) 75 MCG tablet Take 75 mcg by mouth daily before breakfast.   . nitrofurantoin (MACRODANTIN) 100 MG capsule Take 1 capsule (100 mg total) by mouth at bedtime.  Glory Rosebush ULTRA test strip 1 each 2 (two) times daily.  . polyethylene glycol (MIRALAX) 17 g packet Take 17 g by mouth daily as needed for moderate constipation.  [name] pyridoxine (B-6) 100 MG tablet  Take 100 mg by mouth daily.  . traMADol (ULTRAM) 50 MG tablet Take 1 tablet (50 mg total) by mouth every 6 (six) hours as needed.  . vitamin E 400 UNIT capsule Take 400 Units by mouth daily.  [name] zinc gluconate 50 MG tablet Take 50 mg by mouth daily.   No facility-administered encounter medications on file as of 04/11/2020.    Thank you for the opportunity to participate in the care of Mr. Fynn Vanblarcom. The palliative care team will continue to follow. Please call our office at [phone] if we can be of additional assistance.  Jari Favre, DNP, AGPCNP-BC

## 2020-04-12 DIAGNOSIS — E7849 Other hyperlipidemia: Secondary | ICD-10-CM | POA: Diagnosis not present

## 2020-04-12 DIAGNOSIS — E1122 Type 2 diabetes mellitus with diabetic chronic kidney disease: Secondary | ICD-10-CM | POA: Diagnosis not present

## 2020-04-12 DIAGNOSIS — R3914 Feeling of incomplete bladder emptying: Secondary | ICD-10-CM | POA: Diagnosis not present

## 2020-04-12 DIAGNOSIS — I129 Hypertensive chronic kidney disease with stage 1 through stage 4 chronic kidney disease, or unspecified chronic kidney disease: Secondary | ICD-10-CM | POA: Diagnosis not present

## 2020-04-13 DIAGNOSIS — E43 Unspecified severe protein-calorie malnutrition: Secondary | ICD-10-CM | POA: Diagnosis not present

## 2020-04-13 DIAGNOSIS — D631 Anemia in chronic kidney disease: Secondary | ICD-10-CM | POA: Diagnosis not present

## 2020-04-13 DIAGNOSIS — E871 Hypo-osmolality and hyponatremia: Secondary | ICD-10-CM | POA: Diagnosis not present

## 2020-04-13 DIAGNOSIS — I130 Hypertensive heart and chronic kidney disease with heart failure and stage 1 through stage 4 chronic kidney disease, or unspecified chronic kidney disease: Secondary | ICD-10-CM | POA: Diagnosis not present

## 2020-04-13 DIAGNOSIS — I2510 Atherosclerotic heart disease of native coronary artery without angina pectoris: Secondary | ICD-10-CM | POA: Diagnosis not present

## 2020-04-13 DIAGNOSIS — E1165 Type 2 diabetes mellitus with hyperglycemia: Secondary | ICD-10-CM | POA: Diagnosis not present

## 2020-04-13 DIAGNOSIS — N184 Chronic kidney disease, stage 4 (severe): Secondary | ICD-10-CM | POA: Diagnosis not present

## 2020-04-13 DIAGNOSIS — E1122 Type 2 diabetes mellitus with diabetic chronic kidney disease: Secondary | ICD-10-CM | POA: Diagnosis not present

## 2020-04-13 DIAGNOSIS — I5032 Chronic diastolic (congestive) heart failure: Secondary | ICD-10-CM | POA: Diagnosis not present

## 2020-04-13 DIAGNOSIS — I480 Paroxysmal atrial fibrillation: Secondary | ICD-10-CM | POA: Diagnosis not present

## 2020-04-13 DIAGNOSIS — L89152 Pressure ulcer of sacral region, stage 2: Secondary | ICD-10-CM | POA: Diagnosis not present

## 2020-04-13 DIAGNOSIS — Z466 Encounter for fitting and adjustment of urinary device: Secondary | ICD-10-CM | POA: Diagnosis not present

## 2020-04-13 DIAGNOSIS — I252 Old myocardial infarction: Secondary | ICD-10-CM | POA: Diagnosis not present

## 2020-04-14 DIAGNOSIS — I480 Paroxysmal atrial fibrillation: Secondary | ICD-10-CM | POA: Diagnosis not present

## 2020-04-14 DIAGNOSIS — E43 Unspecified severe protein-calorie malnutrition: Secondary | ICD-10-CM | POA: Diagnosis not present

## 2020-04-14 DIAGNOSIS — D631 Anemia in chronic kidney disease: Secondary | ICD-10-CM | POA: Diagnosis not present

## 2020-04-14 DIAGNOSIS — E871 Hypo-osmolality and hyponatremia: Secondary | ICD-10-CM | POA: Diagnosis not present

## 2020-04-14 DIAGNOSIS — N184 Chronic kidney disease, stage 4 (severe): Secondary | ICD-10-CM | POA: Diagnosis not present

## 2020-04-14 DIAGNOSIS — E1165 Type 2 diabetes mellitus with hyperglycemia: Secondary | ICD-10-CM | POA: Diagnosis not present

## 2020-04-14 DIAGNOSIS — I2510 Atherosclerotic heart disease of native coronary artery without angina pectoris: Secondary | ICD-10-CM | POA: Diagnosis not present

## 2020-04-14 DIAGNOSIS — Z466 Encounter for fitting and adjustment of urinary device: Secondary | ICD-10-CM | POA: Diagnosis not present

## 2020-04-14 DIAGNOSIS — I252 Old myocardial infarction: Secondary | ICD-10-CM | POA: Diagnosis not present

## 2020-04-14 DIAGNOSIS — E1122 Type 2 diabetes mellitus with diabetic chronic kidney disease: Secondary | ICD-10-CM | POA: Diagnosis not present

## 2020-04-14 DIAGNOSIS — I5032 Chronic diastolic (congestive) heart failure: Secondary | ICD-10-CM | POA: Diagnosis not present

## 2020-04-14 DIAGNOSIS — L89152 Pressure ulcer of sacral region, stage 2: Secondary | ICD-10-CM | POA: Diagnosis not present

## 2020-04-14 DIAGNOSIS — I130 Hypertensive heart and chronic kidney disease with heart failure and stage 1 through stage 4 chronic kidney disease, or unspecified chronic kidney disease: Secondary | ICD-10-CM | POA: Diagnosis not present

## 2020-04-18 DIAGNOSIS — I252 Old myocardial infarction: Secondary | ICD-10-CM | POA: Diagnosis not present

## 2020-04-18 DIAGNOSIS — E1122 Type 2 diabetes mellitus with diabetic chronic kidney disease: Secondary | ICD-10-CM | POA: Diagnosis not present

## 2020-04-18 DIAGNOSIS — I480 Paroxysmal atrial fibrillation: Secondary | ICD-10-CM | POA: Diagnosis not present

## 2020-04-18 DIAGNOSIS — I2510 Atherosclerotic heart disease of native coronary artery without angina pectoris: Secondary | ICD-10-CM | POA: Diagnosis not present

## 2020-04-18 DIAGNOSIS — E871 Hypo-osmolality and hyponatremia: Secondary | ICD-10-CM | POA: Diagnosis not present

## 2020-04-18 DIAGNOSIS — I5032 Chronic diastolic (congestive) heart failure: Secondary | ICD-10-CM | POA: Diagnosis not present

## 2020-04-18 DIAGNOSIS — L89152 Pressure ulcer of sacral region, stage 2: Secondary | ICD-10-CM | POA: Diagnosis not present

## 2020-04-18 DIAGNOSIS — E43 Unspecified severe protein-calorie malnutrition: Secondary | ICD-10-CM | POA: Diagnosis not present

## 2020-04-18 DIAGNOSIS — D631 Anemia in chronic kidney disease: Secondary | ICD-10-CM | POA: Diagnosis not present

## 2020-04-18 DIAGNOSIS — E1165 Type 2 diabetes mellitus with hyperglycemia: Secondary | ICD-10-CM | POA: Diagnosis not present

## 2020-04-18 DIAGNOSIS — Z466 Encounter for fitting and adjustment of urinary device: Secondary | ICD-10-CM | POA: Diagnosis not present

## 2020-04-18 DIAGNOSIS — N184 Chronic kidney disease, stage 4 (severe): Secondary | ICD-10-CM | POA: Diagnosis not present

## 2020-04-18 DIAGNOSIS — I130 Hypertensive heart and chronic kidney disease with heart failure and stage 1 through stage 4 chronic kidney disease, or unspecified chronic kidney disease: Secondary | ICD-10-CM | POA: Diagnosis not present

## 2020-04-20 DIAGNOSIS — I480 Paroxysmal atrial fibrillation: Secondary | ICD-10-CM | POA: Diagnosis not present

## 2020-04-20 DIAGNOSIS — E1122 Type 2 diabetes mellitus with diabetic chronic kidney disease: Secondary | ICD-10-CM | POA: Diagnosis not present

## 2020-04-20 DIAGNOSIS — I5032 Chronic diastolic (congestive) heart failure: Secondary | ICD-10-CM | POA: Diagnosis not present

## 2020-04-20 DIAGNOSIS — N184 Chronic kidney disease, stage 4 (severe): Secondary | ICD-10-CM | POA: Diagnosis not present

## 2020-04-20 DIAGNOSIS — Z466 Encounter for fitting and adjustment of urinary device: Secondary | ICD-10-CM | POA: Diagnosis not present

## 2020-04-20 DIAGNOSIS — E871 Hypo-osmolality and hyponatremia: Secondary | ICD-10-CM | POA: Diagnosis not present

## 2020-04-20 DIAGNOSIS — D631 Anemia in chronic kidney disease: Secondary | ICD-10-CM | POA: Diagnosis not present

## 2020-04-20 DIAGNOSIS — I130 Hypertensive heart and chronic kidney disease with heart failure and stage 1 through stage 4 chronic kidney disease, or unspecified chronic kidney disease: Secondary | ICD-10-CM | POA: Diagnosis not present

## 2020-04-20 DIAGNOSIS — E43 Unspecified severe protein-calorie malnutrition: Secondary | ICD-10-CM | POA: Diagnosis not present

## 2020-04-20 DIAGNOSIS — E1165 Type 2 diabetes mellitus with hyperglycemia: Secondary | ICD-10-CM | POA: Diagnosis not present

## 2020-04-20 DIAGNOSIS — I2510 Atherosclerotic heart disease of native coronary artery without angina pectoris: Secondary | ICD-10-CM | POA: Diagnosis not present

## 2020-04-20 DIAGNOSIS — L89152 Pressure ulcer of sacral region, stage 2: Secondary | ICD-10-CM | POA: Diagnosis not present

## 2020-04-20 DIAGNOSIS — I252 Old myocardial infarction: Secondary | ICD-10-CM | POA: Diagnosis not present

## 2020-04-21 DIAGNOSIS — I2510 Atherosclerotic heart disease of native coronary artery without angina pectoris: Secondary | ICD-10-CM | POA: Diagnosis not present

## 2020-04-21 DIAGNOSIS — E871 Hypo-osmolality and hyponatremia: Secondary | ICD-10-CM | POA: Diagnosis not present

## 2020-04-21 DIAGNOSIS — I5032 Chronic diastolic (congestive) heart failure: Secondary | ICD-10-CM | POA: Diagnosis not present

## 2020-04-21 DIAGNOSIS — I130 Hypertensive heart and chronic kidney disease with heart failure and stage 1 through stage 4 chronic kidney disease, or unspecified chronic kidney disease: Secondary | ICD-10-CM | POA: Diagnosis not present

## 2020-04-21 DIAGNOSIS — I252 Old myocardial infarction: Secondary | ICD-10-CM | POA: Diagnosis not present

## 2020-04-21 DIAGNOSIS — L89152 Pressure ulcer of sacral region, stage 2: Secondary | ICD-10-CM | POA: Diagnosis not present

## 2020-04-21 DIAGNOSIS — E43 Unspecified severe protein-calorie malnutrition: Secondary | ICD-10-CM | POA: Diagnosis not present

## 2020-04-21 DIAGNOSIS — E1165 Type 2 diabetes mellitus with hyperglycemia: Secondary | ICD-10-CM | POA: Diagnosis not present

## 2020-04-21 DIAGNOSIS — E1122 Type 2 diabetes mellitus with diabetic chronic kidney disease: Secondary | ICD-10-CM | POA: Diagnosis not present

## 2020-04-21 DIAGNOSIS — I480 Paroxysmal atrial fibrillation: Secondary | ICD-10-CM | POA: Diagnosis not present

## 2020-04-21 DIAGNOSIS — N184 Chronic kidney disease, stage 4 (severe): Secondary | ICD-10-CM | POA: Diagnosis not present

## 2020-04-21 DIAGNOSIS — D631 Anemia in chronic kidney disease: Secondary | ICD-10-CM | POA: Diagnosis not present

## 2020-04-21 DIAGNOSIS — Z466 Encounter for fitting and adjustment of urinary device: Secondary | ICD-10-CM | POA: Diagnosis not present

## 2020-04-24 DIAGNOSIS — D631 Anemia in chronic kidney disease: Secondary | ICD-10-CM | POA: Diagnosis not present

## 2020-04-24 DIAGNOSIS — I5032 Chronic diastolic (congestive) heart failure: Secondary | ICD-10-CM | POA: Diagnosis not present

## 2020-04-24 DIAGNOSIS — E871 Hypo-osmolality and hyponatremia: Secondary | ICD-10-CM | POA: Diagnosis not present

## 2020-04-24 DIAGNOSIS — I2510 Atherosclerotic heart disease of native coronary artery without angina pectoris: Secondary | ICD-10-CM | POA: Diagnosis not present

## 2020-04-24 DIAGNOSIS — Z466 Encounter for fitting and adjustment of urinary device: Secondary | ICD-10-CM | POA: Diagnosis not present

## 2020-04-24 DIAGNOSIS — N184 Chronic kidney disease, stage 4 (severe): Secondary | ICD-10-CM | POA: Diagnosis not present

## 2020-04-24 DIAGNOSIS — E43 Unspecified severe protein-calorie malnutrition: Secondary | ICD-10-CM | POA: Diagnosis not present

## 2020-04-24 DIAGNOSIS — I130 Hypertensive heart and chronic kidney disease with heart failure and stage 1 through stage 4 chronic kidney disease, or unspecified chronic kidney disease: Secondary | ICD-10-CM | POA: Diagnosis not present

## 2020-04-24 DIAGNOSIS — L89152 Pressure ulcer of sacral region, stage 2: Secondary | ICD-10-CM | POA: Diagnosis not present

## 2020-04-24 DIAGNOSIS — E1122 Type 2 diabetes mellitus with diabetic chronic kidney disease: Secondary | ICD-10-CM | POA: Diagnosis not present

## 2020-04-24 DIAGNOSIS — I480 Paroxysmal atrial fibrillation: Secondary | ICD-10-CM | POA: Diagnosis not present

## 2020-04-24 DIAGNOSIS — E1165 Type 2 diabetes mellitus with hyperglycemia: Secondary | ICD-10-CM | POA: Diagnosis not present

## 2020-04-24 DIAGNOSIS — I252 Old myocardial infarction: Secondary | ICD-10-CM | POA: Diagnosis not present

## 2020-04-26 DIAGNOSIS — Z466 Encounter for fitting and adjustment of urinary device: Secondary | ICD-10-CM | POA: Diagnosis not present

## 2020-04-26 DIAGNOSIS — I2510 Atherosclerotic heart disease of native coronary artery without angina pectoris: Secondary | ICD-10-CM | POA: Diagnosis not present

## 2020-04-26 DIAGNOSIS — N184 Chronic kidney disease, stage 4 (severe): Secondary | ICD-10-CM | POA: Diagnosis not present

## 2020-04-26 DIAGNOSIS — E1165 Type 2 diabetes mellitus with hyperglycemia: Secondary | ICD-10-CM | POA: Diagnosis not present

## 2020-04-26 DIAGNOSIS — E1122 Type 2 diabetes mellitus with diabetic chronic kidney disease: Secondary | ICD-10-CM | POA: Diagnosis not present

## 2020-04-26 DIAGNOSIS — I480 Paroxysmal atrial fibrillation: Secondary | ICD-10-CM | POA: Diagnosis not present

## 2020-04-26 DIAGNOSIS — E43 Unspecified severe protein-calorie malnutrition: Secondary | ICD-10-CM | POA: Diagnosis not present

## 2020-04-26 DIAGNOSIS — I130 Hypertensive heart and chronic kidney disease with heart failure and stage 1 through stage 4 chronic kidney disease, or unspecified chronic kidney disease: Secondary | ICD-10-CM | POA: Diagnosis not present

## 2020-04-26 DIAGNOSIS — I5032 Chronic diastolic (congestive) heart failure: Secondary | ICD-10-CM | POA: Diagnosis not present

## 2020-04-26 DIAGNOSIS — E871 Hypo-osmolality and hyponatremia: Secondary | ICD-10-CM | POA: Diagnosis not present

## 2020-04-26 DIAGNOSIS — L89152 Pressure ulcer of sacral region, stage 2: Secondary | ICD-10-CM | POA: Diagnosis not present

## 2020-04-26 DIAGNOSIS — I252 Old myocardial infarction: Secondary | ICD-10-CM | POA: Diagnosis not present

## 2020-04-26 DIAGNOSIS — D631 Anemia in chronic kidney disease: Secondary | ICD-10-CM | POA: Diagnosis not present

## 2020-04-28 DIAGNOSIS — L89152 Pressure ulcer of sacral region, stage 2: Secondary | ICD-10-CM | POA: Diagnosis not present

## 2020-04-28 DIAGNOSIS — N184 Chronic kidney disease, stage 4 (severe): Secondary | ICD-10-CM | POA: Diagnosis not present

## 2020-04-28 DIAGNOSIS — Z466 Encounter for fitting and adjustment of urinary device: Secondary | ICD-10-CM | POA: Diagnosis not present

## 2020-04-28 DIAGNOSIS — I5032 Chronic diastolic (congestive) heart failure: Secondary | ICD-10-CM | POA: Diagnosis not present

## 2020-04-28 DIAGNOSIS — E43 Unspecified severe protein-calorie malnutrition: Secondary | ICD-10-CM | POA: Diagnosis not present

## 2020-04-28 DIAGNOSIS — I130 Hypertensive heart and chronic kidney disease with heart failure and stage 1 through stage 4 chronic kidney disease, or unspecified chronic kidney disease: Secondary | ICD-10-CM | POA: Diagnosis not present

## 2020-04-28 DIAGNOSIS — E1165 Type 2 diabetes mellitus with hyperglycemia: Secondary | ICD-10-CM | POA: Diagnosis not present

## 2020-04-28 DIAGNOSIS — E1122 Type 2 diabetes mellitus with diabetic chronic kidney disease: Secondary | ICD-10-CM | POA: Diagnosis not present

## 2020-04-28 DIAGNOSIS — I2510 Atherosclerotic heart disease of native coronary artery without angina pectoris: Secondary | ICD-10-CM | POA: Diagnosis not present

## 2020-04-28 DIAGNOSIS — D631 Anemia in chronic kidney disease: Secondary | ICD-10-CM | POA: Diagnosis not present

## 2020-04-28 DIAGNOSIS — E871 Hypo-osmolality and hyponatremia: Secondary | ICD-10-CM | POA: Diagnosis not present

## 2020-04-28 DIAGNOSIS — I252 Old myocardial infarction: Secondary | ICD-10-CM | POA: Diagnosis not present

## 2020-04-28 DIAGNOSIS — I480 Paroxysmal atrial fibrillation: Secondary | ICD-10-CM | POA: Diagnosis not present

## 2020-05-01 ENCOUNTER — Other Ambulatory Visit: Payer: Self-pay | Admitting: *Deleted

## 2020-05-01 NOTE — Patient Outreach (Signed)
[facility]) Care Management  [place]  05/01/2020   [name] 1927-06-09 [id number]   Outgoing call placed to to [facility] son, report he is doing well, had chronic catheter changed a couple weeks ago.  Continues with home health visits.  Denies any urgent concerns, encouraged to contact this care manager with questions.    Encounter Medications:  Outpatient Encounter Medications as of 05/01/2020  Medication Sig Note  . acetaminophen (TYLENOL) 500 MG tablet Take 500 mg by mouth every 6 (six) hours as needed for headache (pain).   [name] amLODipine (NORVASC) 10 MG tablet Take 1 tablet (10 mg total) by mouth daily. 02/24/2020: Son is not sure if he takes this medication  . apixaban (ELIQUIS) 5 MG TABS tablet Take 2.5 mg by mouth daily.   . Ascorbic Acid (VITAMIN C) 1000 MG tablet Take 1,000 mg by mouth daily.    [name] atorvastatin (LIPITOR) 40 MG tablet Take 1 tablet (40 mg total) by mouth every evening.   . betamethasone dipropionate 0.05 % cream Apply 1 application topically 2 (two) times daily.   . carvedilol (COREG) 6.25 MG tablet Take 1 tablet (6.25 mg total) by mouth 2 (two) times daily with a meal.   . cephALEXin (KEFLEX) 250 MG capsule Take 250 mg by mouth daily.   . Cholecalciferol (VITAMIN D-3) 125 MCG (5000 UT) TABS Take 5,000 Units by mouth every evening.   . clopidogrel (PLAVIX) 75 MG tablet Take 1 tablet (75 mg total) by mouth daily with breakfast.   . co-enzyme Q-10 30 MG capsule Take 100 mg by mouth daily.   . Cranberry-Vitamin C (AZO CRANBERRY URINARY TRACT PO) Take 1 tablet by mouth daily.   . famotidine (PEPCID) 20 MG tablet Take 1 tablet (20 mg total) by mouth daily.   . ferrous sulfate 325 (65 FE) MG tablet Take 325 mg by mouth 2 (two) times daily with a meal.   . glipiZIDE (GLUCOTROL) 10 MG tablet Take 10 mg by mouth 2 (two) times daily.   [name] latanoprost (XALATAN) 0.005 % ophthalmic solution Place 1 drop into both eyes at bedtime.   [name] levothyroxine  (SYNTHROID) 75 MCG tablet Take 75 mcg by mouth daily before breakfast.    . nitrofurantoin (MACRODANTIN) 100 MG capsule Take 1 capsule (100 mg total) by mouth at bedtime.   Glory Rosebush ULTRA test strip 1 each 2 (two) times daily.   . polyethylene glycol (MIRALAX) 17 g packet Take 17 g by mouth daily as needed for moderate constipation.   [name] pyridoxine (B-6) 100 MG tablet Take 100 mg by mouth daily. 12/06/2019: Not sure ask sister  . traMADol (ULTRAM) 50 MG tablet Take 1 tablet (50 mg total) by mouth every 6 (six) hours as needed. 10/04/2019: #60 filled 02/15/2019 [facility] per PMP AWARE  . vitamin E 400 UNIT capsule Take 400 Units by mouth daily.   [name] zinc gluconate 50 MG tablet Take 50 mg by mouth daily.    No facility-administered encounter medications on file as of 05/01/2020.    Functional Status:  In your present state of health, do you have any difficulty performing the following activities: 02/27/2020 12/06/2019  Hearing? N Y  Vision? N N  Difficulty concentrating or making decisions? [place]  Walking or climbing stairs? Y Y  Dressing or bathing? Y Y  Doing errands, shopping? Y N  Some recent data might be hidden    Fall/Depression Screening: Fall Risk  03/28/2020 07/21/2019 06/02/2019  Falls  in the past year? 0 1 1  Number falls in past yr: - 1 1  Injury with Fall? - 1 1  Risk for fall due to : Impaired balance/gait;Impaired mobility Impaired mobility;Impaired balance/gait Impaired mobility;Impaired balance/gait;History of fall(s)  Risk for fall due to: Comment - Bilateral amputation bilateral BKA  Follow up Falls evaluation completed;Education provided Falls prevention discussed;Education provided;Falls evaluation completed Falls prevention discussed;Education provided;Falls evaluation completed   PHQ 2/9 Scores 03/28/2020 10/19/2019 06/02/2019 01/12/2019  PHQ - 2 Score 0 - 1 2  PHQ- 9 Score - - - 3  Exception Documentation - Other- indicate reason in comment box - -    Assessment:   Goals Addressed            This Visit's Progress   . [facility] - Monitor and Manage My Blood Sugar   On track    Follow Up Date 05/01/2020  Timeframe:  Short-Term Goal Priority:  Medium Start Date:          3/21         Expected End Date:   4/21                  - check blood sugar at prescribed times - check blood sugar if I feel it is too high or too low - take the blood sugar log to all doctor visits    Why is this important?   Checking your blood sugar at home helps to keep it from getting very high or very low.  Writing the results in a diary or log helps the doctor know how to care for you.  Your blood sugar log should have the time, date and the results.  Also, write down the amount of insulin or other medicine that you take.  Other information, like what you ate, exercise done and how you were feeling, will also be helpful.     Notes:   11/18 - blood sugar readings reviewed, discussed compliance with medication regime  12/28 - Discussed proper diabetic and low sodium diet  02/11/2020 - Remind of importance of managing blood sugars in correlation to wound healing  2/21 - Confirmed member has ongoing participation with home health for weekly monitoring  3/21 - Encouraged to continue daily blood sugar monitoring as well as recording readings  4/18 - Son report blood sugars range 130-170's, was 219 today.  Relates this to increased appetite and eating over the Easter holiday.    Margie Billet - Set My Target A1C   On track    Follow Up Date 04/03/2020  Timeframe:  Long-Range Goal Priority:  Medium Start Date:       2/21                      Expected End Date:   06/10/2020                      - set target A1C (7)    Why is this important?   Your target A1C is decided together by you and your doctor.  It is based on several things like your age and other health issues.    Notes:   11/18 - Discussed dose change for oral diabetic agents  12/28 - Call placed to PCP office  requesting order for new A1C lab  1/28 - Re-educated on DM management in effort to decrease A1C  2/21 - Review CBG trends, encouraged to continue plan of care  3/21 - Reminded son that controlled CBG's will promote wound healing  4/18 - Most recent A1C - 7.5, son verbalized understanding of ongoing management of blood sugars and consistent A1C within range       Plan:  Follow-up:  Patient agrees to Care Plan and Follow-up.  Will follow up within the next month.  [name], [place], MSN [place] [phone]

## 2020-05-02 DIAGNOSIS — L89152 Pressure ulcer of sacral region, stage 2: Secondary | ICD-10-CM | POA: Diagnosis not present

## 2020-05-02 DIAGNOSIS — E1122 Type 2 diabetes mellitus with diabetic chronic kidney disease: Secondary | ICD-10-CM | POA: Diagnosis not present

## 2020-05-02 DIAGNOSIS — I252 Old myocardial infarction: Secondary | ICD-10-CM | POA: Diagnosis not present

## 2020-05-02 DIAGNOSIS — E43 Unspecified severe protein-calorie malnutrition: Secondary | ICD-10-CM | POA: Diagnosis not present

## 2020-05-02 DIAGNOSIS — N184 Chronic kidney disease, stage 4 (severe): Secondary | ICD-10-CM | POA: Diagnosis not present

## 2020-05-02 DIAGNOSIS — I5032 Chronic diastolic (congestive) heart failure: Secondary | ICD-10-CM | POA: Diagnosis not present

## 2020-05-02 DIAGNOSIS — Z466 Encounter for fitting and adjustment of urinary device: Secondary | ICD-10-CM | POA: Diagnosis not present

## 2020-05-02 DIAGNOSIS — I2510 Atherosclerotic heart disease of native coronary artery without angina pectoris: Secondary | ICD-10-CM | POA: Diagnosis not present

## 2020-05-02 DIAGNOSIS — E871 Hypo-osmolality and hyponatremia: Secondary | ICD-10-CM | POA: Diagnosis not present

## 2020-05-02 DIAGNOSIS — I480 Paroxysmal atrial fibrillation: Secondary | ICD-10-CM | POA: Diagnosis not present

## 2020-05-02 DIAGNOSIS — D631 Anemia in chronic kidney disease: Secondary | ICD-10-CM | POA: Diagnosis not present

## 2020-05-02 DIAGNOSIS — I130 Hypertensive heart and chronic kidney disease with heart failure and stage 1 through stage 4 chronic kidney disease, or unspecified chronic kidney disease: Secondary | ICD-10-CM | POA: Diagnosis not present

## 2020-05-02 DIAGNOSIS — E1165 Type 2 diabetes mellitus with hyperglycemia: Secondary | ICD-10-CM | POA: Diagnosis not present

## 2020-05-04 DIAGNOSIS — I5032 Chronic diastolic (congestive) heart failure: Secondary | ICD-10-CM | POA: Diagnosis not present

## 2020-05-04 DIAGNOSIS — Z466 Encounter for fitting and adjustment of urinary device: Secondary | ICD-10-CM | POA: Diagnosis not present

## 2020-05-04 DIAGNOSIS — D631 Anemia in chronic kidney disease: Secondary | ICD-10-CM | POA: Diagnosis not present

## 2020-05-04 DIAGNOSIS — I252 Old myocardial infarction: Secondary | ICD-10-CM | POA: Diagnosis not present

## 2020-05-04 DIAGNOSIS — L89152 Pressure ulcer of sacral region, stage 2: Secondary | ICD-10-CM | POA: Diagnosis not present

## 2020-05-04 DIAGNOSIS — I480 Paroxysmal atrial fibrillation: Secondary | ICD-10-CM | POA: Diagnosis not present

## 2020-05-04 DIAGNOSIS — N184 Chronic kidney disease, stage 4 (severe): Secondary | ICD-10-CM | POA: Diagnosis not present

## 2020-05-04 DIAGNOSIS — E43 Unspecified severe protein-calorie malnutrition: Secondary | ICD-10-CM | POA: Diagnosis not present

## 2020-05-04 DIAGNOSIS — E871 Hypo-osmolality and hyponatremia: Secondary | ICD-10-CM | POA: Diagnosis not present

## 2020-05-04 DIAGNOSIS — E1122 Type 2 diabetes mellitus with diabetic chronic kidney disease: Secondary | ICD-10-CM | POA: Diagnosis not present

## 2020-05-04 DIAGNOSIS — E1165 Type 2 diabetes mellitus with hyperglycemia: Secondary | ICD-10-CM | POA: Diagnosis not present

## 2020-05-04 DIAGNOSIS — I2510 Atherosclerotic heart disease of native coronary artery without angina pectoris: Secondary | ICD-10-CM | POA: Diagnosis not present

## 2020-05-04 DIAGNOSIS — I130 Hypertensive heart and chronic kidney disease with heart failure and stage 1 through stage 4 chronic kidney disease, or unspecified chronic kidney disease: Secondary | ICD-10-CM | POA: Diagnosis not present

## 2020-05-05 DIAGNOSIS — N184 Chronic kidney disease, stage 4 (severe): Secondary | ICD-10-CM | POA: Diagnosis not present

## 2020-05-05 DIAGNOSIS — E871 Hypo-osmolality and hyponatremia: Secondary | ICD-10-CM | POA: Diagnosis not present

## 2020-05-05 DIAGNOSIS — D631 Anemia in chronic kidney disease: Secondary | ICD-10-CM | POA: Diagnosis not present

## 2020-05-05 DIAGNOSIS — I130 Hypertensive heart and chronic kidney disease with heart failure and stage 1 through stage 4 chronic kidney disease, or unspecified chronic kidney disease: Secondary | ICD-10-CM | POA: Diagnosis not present

## 2020-05-05 DIAGNOSIS — L89152 Pressure ulcer of sacral region, stage 2: Secondary | ICD-10-CM | POA: Diagnosis not present

## 2020-05-05 DIAGNOSIS — I252 Old myocardial infarction: Secondary | ICD-10-CM | POA: Diagnosis not present

## 2020-05-05 DIAGNOSIS — E43 Unspecified severe protein-calorie malnutrition: Secondary | ICD-10-CM | POA: Diagnosis not present

## 2020-05-05 DIAGNOSIS — I2510 Atherosclerotic heart disease of native coronary artery without angina pectoris: Secondary | ICD-10-CM | POA: Diagnosis not present

## 2020-05-05 DIAGNOSIS — I5032 Chronic diastolic (congestive) heart failure: Secondary | ICD-10-CM | POA: Diagnosis not present

## 2020-05-05 DIAGNOSIS — E1122 Type 2 diabetes mellitus with diabetic chronic kidney disease: Secondary | ICD-10-CM | POA: Diagnosis not present

## 2020-05-05 DIAGNOSIS — E1165 Type 2 diabetes mellitus with hyperglycemia: Secondary | ICD-10-CM | POA: Diagnosis not present

## 2020-05-05 DIAGNOSIS — Z466 Encounter for fitting and adjustment of urinary device: Secondary | ICD-10-CM | POA: Diagnosis not present

## 2020-05-05 DIAGNOSIS — I480 Paroxysmal atrial fibrillation: Secondary | ICD-10-CM | POA: Diagnosis not present

## 2020-05-08 ENCOUNTER — Other Ambulatory Visit: Payer: Self-pay

## 2020-05-08 DIAGNOSIS — Z89511 Acquired absence of right leg below knee: Secondary | ICD-10-CM

## 2020-05-08 DIAGNOSIS — Z89512 Acquired absence of left leg below knee: Secondary | ICD-10-CM

## 2020-05-09 DIAGNOSIS — I5032 Chronic diastolic (congestive) heart failure: Secondary | ICD-10-CM | POA: Diagnosis not present

## 2020-05-09 DIAGNOSIS — I2510 Atherosclerotic heart disease of native coronary artery without angina pectoris: Secondary | ICD-10-CM | POA: Diagnosis not present

## 2020-05-09 DIAGNOSIS — L89152 Pressure ulcer of sacral region, stage 2: Secondary | ICD-10-CM | POA: Diagnosis not present

## 2020-05-09 DIAGNOSIS — E1122 Type 2 diabetes mellitus with diabetic chronic kidney disease: Secondary | ICD-10-CM | POA: Diagnosis not present

## 2020-05-09 DIAGNOSIS — E871 Hypo-osmolality and hyponatremia: Secondary | ICD-10-CM | POA: Diagnosis not present

## 2020-05-09 DIAGNOSIS — E43 Unspecified severe protein-calorie malnutrition: Secondary | ICD-10-CM | POA: Diagnosis not present

## 2020-05-09 DIAGNOSIS — I252 Old myocardial infarction: Secondary | ICD-10-CM | POA: Diagnosis not present

## 2020-05-09 DIAGNOSIS — Z466 Encounter for fitting and adjustment of urinary device: Secondary | ICD-10-CM | POA: Diagnosis not present

## 2020-05-09 DIAGNOSIS — D631 Anemia in chronic kidney disease: Secondary | ICD-10-CM | POA: Diagnosis not present

## 2020-05-09 DIAGNOSIS — I480 Paroxysmal atrial fibrillation: Secondary | ICD-10-CM | POA: Diagnosis not present

## 2020-05-09 DIAGNOSIS — E1165 Type 2 diabetes mellitus with hyperglycemia: Secondary | ICD-10-CM | POA: Diagnosis not present

## 2020-05-09 DIAGNOSIS — I130 Hypertensive heart and chronic kidney disease with heart failure and stage 1 through stage 4 chronic kidney disease, or unspecified chronic kidney disease: Secondary | ICD-10-CM | POA: Diagnosis not present

## 2020-05-09 DIAGNOSIS — N184 Chronic kidney disease, stage 4 (severe): Secondary | ICD-10-CM | POA: Diagnosis not present

## 2020-05-10 DIAGNOSIS — R3914 Feeling of incomplete bladder emptying: Secondary | ICD-10-CM | POA: Diagnosis not present

## 2020-05-13 DIAGNOSIS — E1122 Type 2 diabetes mellitus with diabetic chronic kidney disease: Secondary | ICD-10-CM | POA: Diagnosis not present

## 2020-05-13 DIAGNOSIS — I129 Hypertensive chronic kidney disease with stage 1 through stage 4 chronic kidney disease, or unspecified chronic kidney disease: Secondary | ICD-10-CM | POA: Diagnosis not present

## 2020-05-13 DIAGNOSIS — E7849 Other hyperlipidemia: Secondary | ICD-10-CM | POA: Diagnosis not present

## 2020-05-17 DIAGNOSIS — D638 Anemia in other chronic diseases classified elsewhere: Secondary | ICD-10-CM | POA: Diagnosis not present

## 2020-05-17 DIAGNOSIS — N189 Chronic kidney disease, unspecified: Secondary | ICD-10-CM | POA: Diagnosis not present

## 2020-05-17 DIAGNOSIS — R809 Proteinuria, unspecified: Secondary | ICD-10-CM | POA: Diagnosis not present

## 2020-05-17 DIAGNOSIS — N342 Other urethritis: Secondary | ICD-10-CM | POA: Diagnosis not present

## 2020-05-17 DIAGNOSIS — E1129 Type 2 diabetes mellitus with other diabetic kidney complication: Secondary | ICD-10-CM | POA: Diagnosis not present

## 2020-05-17 DIAGNOSIS — E1122 Type 2 diabetes mellitus with diabetic chronic kidney disease: Secondary | ICD-10-CM | POA: Diagnosis not present

## 2020-05-23 ENCOUNTER — Other Ambulatory Visit: Payer: Self-pay

## 2020-05-23 ENCOUNTER — Encounter: Payer: Self-pay | Admitting: Physical Therapy

## 2020-05-23 ENCOUNTER — Ambulatory Visit: Payer: Medicare Other | Admitting: Physical Therapy

## 2020-05-23 DIAGNOSIS — M6249 Contracture of muscle, multiple sites: Secondary | ICD-10-CM

## 2020-05-23 DIAGNOSIS — M25662 Stiffness of left knee, not elsewhere classified: Secondary | ICD-10-CM

## 2020-05-23 DIAGNOSIS — R293 Abnormal posture: Secondary | ICD-10-CM | POA: Diagnosis not present

## 2020-05-23 DIAGNOSIS — M25661 Stiffness of right knee, not elsewhere classified: Secondary | ICD-10-CM

## 2020-05-23 DIAGNOSIS — R2681 Unsteadiness on feet: Secondary | ICD-10-CM

## 2020-05-23 DIAGNOSIS — M6281 Muscle weakness (generalized): Secondary | ICD-10-CM | POA: Diagnosis not present

## 2020-05-23 DIAGNOSIS — R2689 Other abnormalities of gait and mobility: Secondary | ICD-10-CM | POA: Diagnosis not present

## 2020-05-24 NOTE — Therapy (Signed)
[facility] Physical Therapy [address] [place], [place], [address] Phone: [phone]   Fax:  [phone]  Physical Therapy Evaluation  Patient Details  Name: [name] MRN: [id number] Date of Birth: [birth year] Referring Provider (PT): [name], MD   Encounter Date: 05/23/2020   PT End of Session - 05/23/20 1737    Visit Number 1    Number of Visits 25    Date for PT Re-Evaluation 08/17/20    Authorization Type UHC Medicare    Progress Note Due on Visit 10    PT Start Time 1145    PT Stop Time 1235    PT Time Calculation (min) 50 min    Equipment Utilized During Treatment Gait belt    Activity Tolerance Patient tolerated treatment well;Patient limited by fatigue    Behavior During Therapy [facility] for tasks assessed/performed           Past Medical History:  Diagnosis Date  . Arthritis   . Borderline diabetic   . CHF (congestive heart failure) ([place])   . Chronic kidney disease   . Diabetes mellitus without complication ([place])   . Dysrhythmia   . Glaucoma   . Hyperlipidemia   . Hypertension   . Hypothyroidism   . Myocardial infarction ([place])   . PVD (peripheral vascular disease) ([place])   . Sleep apnea    Cpap ordered but doesnt use  . Urinary retention due to benign prostatic hyperplasia 12/28/2018    Past Surgical History:  Procedure Laterality Date  . ABDOMINAL AORTOGRAM W/LOWER EXTREMITY N/A 07/27/2018   Procedure: ABDOMINAL AORTOGRAM W/LOWER EXTREMITY;  Surgeon: Lorretta Harp, MD;  Location: [place] [facility];  Service: Cardiovascular;  Laterality: N/A;  . AMPUTATION Right 08/28/2018   Procedure: RIGHT BELOW KNEE AMPUTATION;  Surgeon: [name], MD;  Location: [facility];  Service: Orthopedics;  Laterality: Right;  . AMPUTATION Left 12/02/2018   Procedure: LEFT TRANSMETATARSAL AMPUTATION AND NEGATIVE PRESSURE WOUND VAC PLACEMENT;  Surgeon: [name], MD;  Location: [place];  Service: Orthopedics;  Laterality: Left;  . AMPUTATION  Left 12/18/2018   Procedure: LEFT BELOW KNEE AMPUTATION;  Surgeon: [name], MD;  Location: [place];  Service: Orthopedics;  Laterality: Left;  . APPENDECTOMY  1943  . BELOW KNEE LEG AMPUTATION Left 12/18/2018  . CATARACT EXTRACTION  2012   x2  . COLONOSCOPY N/A 11/01/2014   Procedure: COLONOSCOPY;  Surgeon: Aviva Signs, MD;  [facility];  Service: Gastroenterology;  Laterality: N/A;  . ESOPHAGOGASTRODUODENOSCOPY N/A 11/01/2014   Procedure: ESOPHAGOGASTRODUODENOSCOPY (EGD);  Surgeon: Aviva Signs, MD;  [facility];  Service: Gastroenterology;  Laterality: N/A;  . HERNIA REPAIR Left   . INGUINAL HERNIA REPAIR Right 03/01/2013   Procedure: RIGHT INGUINAL HERNIORRHAPHY;  Surgeon: Jamesetta So, MD;  Location: [facility];  Service: General;  Laterality: Right;  . INSERTION OF MESH Right 03/01/2013   Procedure: INSERTION OF MESH;  Surgeon: Jamesetta So, MD;  Location[facility];  Service: General;  Laterality: Right;  . LOWER EXTREMITY ANGIOGRAPHY Right 08/25/2018   Procedure: LOWER EXTREMITY ANGIOGRAPHY;  Surgeon: Wellington Hampshire, MD;  Location: [place] [facility];  Service: Cardiovascular;  Laterality: Right;  . LOWER EXTREMITY ANGIOGRAPHY N/A 11/25/2018   Procedure: LOWER EXTREMITY ANGIOGRAPHY;  Surgeon: Wellington Hampshire, MD;  Location: [place] [facility];  Service: Cardiovascular;  Laterality: N/A;  . NM MYOCAR PERF WALL MOTION  06/05/2010   no significant ischemia  . PERIPHERAL VASCULAR  ATHERECTOMY Right 08/03/2018   Procedure: PERIPHERAL VASCULAR ATHERECTOMY;  Surgeon: Lorretta Harp, MD;  Location: [place] [facility];  Service: Cardiovascular;  Laterality: Right;  . PERIPHERAL VASCULAR ATHERECTOMY Left 11/23/2018   Procedure: PERIPHERAL VASCULAR ATHERECTOMY;  Surgeon: Lorretta Harp, MD;  Location: [place] [facility];  Service: Cardiovascular;  Laterality: Left;  sfa with dc balloon  . PERIPHERAL VASCULAR BALLOON ANGIOPLASTY Left 11/25/2018   Procedure:  PERIPHERAL VASCULAR BALLOON ANGIOPLASTY;  Surgeon: Wellington Hampshire, MD;  Location: [place] [facility];  Service: Cardiovascular;  Laterality: Left;  popliteal  . PERIPHERAL VASCULAR INTERVENTION Left 11/25/2018   Procedure: PERIPHERAL VASCULAR INTERVENTION;  Surgeon: Wellington Hampshire, MD;  Location: [place] [facility];  Service: Cardiovascular;  Laterality: Left;  tibial peroneal trunk and peroneal stents   . stent  06/14/2010   left leg  . STUMP REVISION Left 01/20/2019   Procedure: REVISION LEFT BELOW KNEE AMPUTATION;  Surgeon: [name], MD;  Location: [place];  Service: Orthopedics;  Laterality: Left;  . US ECHOCARDIOGRAPHY  01/21/2006   moderate mitral annular ca+, mild MR, AOV moderately sclerotic.    There were no vitals filed for this visit.    Subjective Assessment - 05/23/20 1147    Subjective This 87yo male was referred back to PT on 05/08/2020 by [name], MD with [phone] (ICD-10-CM) - S/P bilateral BKA (below knee amputation). He underwent a left Transtibial Amputation on 12/18/2018 with revision on 01/20/2019 and right Transtibial Amputation on 8/14/20220. He was admitted to hospital 10/04/2019 with NSTEMI,  12/06/2019 with pneumonia & 02/24/2020 with acute metabolic encecphalopathy.  HHPT after each hospitalization.  He has pressure sore on buttocks. Son reports significant decrease in strength since September 2021.    Patient is accompained by: Family member    Pertinent History CAD, NSTEMI, bil TTA, TIA, HTN, DM, CKD stage IV, A-Fib, CHF, aortic stenosis    Limitations Standing;Walking;House hold activities    Patient Stated Goals To be able to walk safely with family in home.    Currently in Pain? No/denies              [facility] PT Assessment - 05/23/20 1145      Assessment   Medical Diagnosis Z89.512,Z89.511 (ICD-10-CM) - S/P bilateral BKA (below knee amputation)    Referring Provider (PT) [name], MD    Onset Date/Surgical Date 05/08/20   MD referral to PT    Hand Dominance Right    Prior Therapy OPPT thru 08/2019 dc due to medical change, HHPT after 3 hospitalizations      Precautions   Precautions Fall      Balance Screen   Has the patient fallen in the past 6 months No    Has the patient had a decrease in activity level because of a fear of falling?  Yes    Is the patient reluctant to leave their home because of a fear of falling?  No   uses w/c with family     [place]   his 3 children rotate for 24hr care   Type of [place] One level    [place] - 2 wheels;Bedside commode;Hospital bed;Wheelchair - Industrial/product designer system with vest     Prior Function   Level of Independence Independent;Independent with household mobility with device;Independent with community mobility with  device   prior to amputations used RW or cane.   Vocation Retired      Haematologist Postural limitations    Postural Limitations Rounded Shoulders;Forward head;Increased thoracic kyphosis;Flexed trunk      ROM / Strength   AROM / PROM / Strength PROM;AROM;Strength      AROM   Overall AROM Comments bil. hip flexion contractures noted in standing    AROM Assessment Site Hip;Knee    Right Knee Extension -30   standing   Left Knee Extension -30   standing     PROM   PROM Assessment Site Knee    Right/Left Knee Right;Left    Right Knee Extension -8    Right Knee Flexion 82    Left Knee Extension -9    Left Knee Flexion 91      Strength   Right Hip Flexion 3+/5    Right Hip Extension 3-/5   tested grossly seated   Right Hip ABduction 3-/5   tested grossly seated   Left Hip Flexion 3+/5    Left Hip Extension 3-/5   tested grossly seated   Left Hip ABduction 3-/5   tested grossly seated   Right Knee Flexion 4-/5   tested grossly seated   Right Knee Extension 4/5    Left Knee  Flexion 4/5   tested grossly seated   Left Knee Extension 4/5      Transfers   Transfers Sit to Stand;Stand to Sit    Sit to Stand 3: Mod assist;With upper extremity assist;With armrests;From chair/3-in-1;Other (comment)   to RW or //bars   Stand to Sit 4: Min assist;With upper extremity assist;With armrests;To chair/3-in-1;Other (comment)   from RW or //bars     Ambulation/Gait   Ambulation/Gait Yes    Ambulation/Gait Assistance 3: Mod assist   2nd person for safety   Ambulation/Gait Assistance Details excessive UE weight bearing,  resting HR 58 SpO2 98%  after gait HR 66 SpO2 93% with dyspnea    Ambulation Distance (Feet) 30 Feet    Assistive device Rolling walker;Prostheses;Parallel bars    Gait Pattern Step-to pattern;Decreased stride length;Right foot flat;Left foot flat;Right flexed knee in stance;Left flexed knee in stance;Lateral hip instability;Trunk flexed;Wide base of support;Poor foot clearance - left;Poor foot clearance - right    Ambulation Surface Level;Indoor      Balance   Balance Assessed Yes      Static Standing Balance   Static Standing - Balance Support Bilateral upper extremity supported   //bars   Static Standing - Level of Assistance 4: Min assist   minA once upright with weight shift over prostheses which required modA   Static Standing - Comment/# of Minutes 1 minute      Dynamic Standing Balance   Dynamic Standing - Balance Support Bilateral upper extremity supported   //bars   Dynamic Standing - Level of Assistance 4: Min assist   minA once upright with weight shift over prostheses which required modA   Dynamic Standing - Comments looks to side only right & left, looks forward but unable to extend neck & upper back to look up.  Unable to release BUE support to reach.           Prosthetics Assessment - 05/24/20 0001      [place] with Prosthetic cleaning;Proper wear schedule/adjustment   family verbalizes proper cleaning  & wear   Prosthetic Care Dependent with Skin check;Residual limb care;Correct ply  sock adjustment    Donning prosthesis  +1 Total assist   family demo proper understanding with minor cues   Doffing prosthesis  +1 Total assist   family demo proper understanding with minor cues   Current prosthetic wear tolerance (days/week)  daily    Current prosthetic wear tolerance (#hours/day)  son reports most of awake hours    Current prosthetic weight-bearing tolerance (hours/day)  2 minutes standing without pain in limbs.     Edema non-pitting    Residual limb condition  deep pink to skin especially proximal to knee, no open areas but frail appearing skin,  no hair growth, min to no hair growth, cylinderical shape    Prosthesis Description bilateral prostheses with silicon gel liner with 49mm anterior portion with pin lock suspension, using shrinker sock under liner due to skin reaction with initial right liner wear, SACH foot, right 3-ply fit & left 5-ply fit,                     Objective measurements completed on examination: See above findings.                 PT Short Term Goals - 05/23/20 1757      PT SHORT TERM GOAL #1   Title Patient sit to/from stand w/c to RW and weight shifts to stable balanced position with minA    Time 1    Period Months    Status New    Target Date 06/23/20      PT SHORT TERM GOAL #2   Title Patient able to stand 2 min with RW support static with contact assist.    Time 1    Period Months    Status New    Target Date 06/23/20      PT SHORT TERM GOAL #3   Title Patient ambulates 73' with RW & prosthesis with modA including 90* turn to position to sit.    Time 1    Period Months    Status New    Target Date 06/23/20             PT Long Term Goals - 05/23/20 1754      PT LONG TERM GOAL #1   Title Patient and family verbalize and demonstrate understanding of proper prosthetic care to enable safe utilization of prostheses.    Time  3    Period Months    Status New    Target Date 08/17/20      PT LONG TERM GOAL #2   Title Patient tolerates wear of bilateral prostheses >90% of awake hours without skin issues or limb pain to enable functional potential during his day.    Time 3    Period Months    Status New    Target Date 08/17/20      PT LONG TERM GOAL #3   Title Sit to / from stand elevated w/c to walker with prostheses with minA    Time 3    Period Months    Status New    Target Date 08/17/20      PT LONG TERM GOAL #4   Title Standing balance with walker support with bilateral prostheses static stance for 2 minutes, scanning environment and reaches 2" anteriorly with supervision.    Time 3    Period Months    Status New    Target Date 08/17/20      PT LONG TERM GOAL #5   Title Patient  ambulates 61' between 2 chairs including turning 90* to position to sit with RW & prostheses with minA from family safely.    Time 3    Period Months    Status New    Target Date 08/17/20                  Plan - 05/23/20 1738    Clinical Impression Statement This 87yo male underwent bilateral transtibial amputations (right 08/28/2018 & left 12/18/2018) and recieved right prosthesis in Nov. 2020 but unable to use & left prosthesis 03/30/2019. He has significant chronic medical issues along with age that resulted in weakness & range limitations. He was progressing with PT in 2021 until he was hospitalized with NSTEMI. He was hospitalized 3 times over 5 months with limited activity & resulting weakness. He returned to PT today with his son to resume prosthetic training with goal to be mobile with family in home.  He has significant weakness & range limitations. He requires modA for sit to stand & positioning for upright then minA to maintain upright only with BUE support. He ambulates 54' with RW with modA with skilled assistance required for safety. His vital signs responded properly to activity but dyspnea present with  exertion.  Patient would benefit from skilled PT to improve his mobility.    Personal Factors and Comorbidities Age;Comorbidity 3+;Time since onset of injury/illness/exacerbation;Past/Current Experience;Fitness    Comorbidities bil TTAs, CAD, NSTEMI, bil TTA, TIA, HTN, DM, CKD stage IV, A-Fib, CHF, aortic stenosis    Examination-Activity Limitations Bed Mobility;Locomotion Level;Stand;Transfers    Stability/Clinical Decision Making Evolving/Moderate complexity    Clinical Decision Making Moderate    Rehab Potential Good    PT Frequency 2x / week    PT Duration 12 weeks    PT Treatment/Interventions ADLs/Self Care Home Management;DME Instruction;Gait training;Stair training;Functional mobility training;Therapeutic activities;Therapeutic exercise;Balance training;Neuromuscular re-education;Patient/family education;Prosthetic Training;Passive range of motion    PT Next Visit Plan HEP at sink, review prosthetic care, balance & gait activities    Consulted and Agree with Plan of Care Patient;Family member/caregiver    Family Member Consulted son, Atharva Mirsky           Patient will benefit from skilled therapeutic intervention in order to improve the following deficits and impairments:  Abnormal gait,Decreased activity tolerance,Decreased balance,Decreased endurance,Decreased knowledge of use of DME,Decreased mobility,Decreased range of motion,Decreased skin integrity,Decreased strength,Impaired sensation,Postural dysfunction,Prosthetic Dependency  Visit Diagnosis: Unsteadiness on feet  Other abnormalities of gait and mobility  Abnormal posture  Stiffness of right knee, not elsewhere classified  Stiffness of left knee, not elsewhere classified  Contracture of muscle, multiple sites  Muscle weakness (generalized)     Problem List Patient Active Problem List   Diagnosis Date Noted  . Uncontrolled type 2 diabetes mellitus with hyperglycemia, without long-term current use of insulin  ([place]) 02/25/2020  . CKD (chronic kidney disease) stage 4, GFR 15-29 ml/min ([facility]) 02/25/2020  . Acute UTI   . AMS (altered mental status) 02/24/2020  . PNA (pneumonia) 12/06/2019  . Sepsis due to PNA 12/06/2019  . Acute metabolic encephalopathy 70/17/7939  . AKI (acute kidney injury) ([place])   . Aortic valve stenosis   . Palliative care encounter   . NSTEMI (non-ST elevated myocardial infarction) ([place]) 10/04/2019  . Acute kidney injury superimposed on CKD ([place]) 10/04/2019  . Hypokalemia 10/04/2019  . Weakness generalized 04/05/2019  . S/P BKA (below knee amputation) unilateral, left ([place]) 02/15/2019  . Pressure injury of skin 02/03/2019  . TIA (transient  ischemic attack) 02/03/2019  . Transient speech disturbance 02/02/2019  . Chronic indwelling Foley catheter 01/22/2019  . Postoperative wound infection 01/20/2019  . Normocytic anemia 01/18/2019  . Cellulitis 01/18/2019  . Infection of deep incisional surgical site after procedure   . Abnormal urinalysis   . Labile blood glucose   . Urinary retention   . S/P bilateral BKA (below knee amputation) ([place])   . Acute blood loss anemia   . Urinary retention due to benign prostatic hyperplasia 12/28/2018  . Unilateral complete BKA, left, initial encounter ([place]) 12/25/2018  . Dehiscence of amputation stump ([place])   . History of transmetatarsal amputation of left foot ([place]) 12/09/2018  . Critical limb ischemia with history of revascularization of same extremity ([place])   . Gangrene of left foot ([place])   . Diabetic ulcer of toe of left foot associated with type 2 diabetes mellitus ([place])   . Cellulitis of left lower extremity   . Toe infection 11/18/2018  . Peripheral edema   . Acute on chronic diastolic CHF (congestive heart failure) ([place]) 11/09/2018  . S/P BKA (below knee amputation) unilateral, right ([place]) 10/05/2018  . Hyponatremia   . Essential hypertension   . Postoperative pain   . Pain of left heel   . Unable to maintain  weight-bearing   . Type 2 diabetes mellitus with diabetic peripheral angiopathy with gangrene ([place])   . Anemia due to acute blood loss   . Chronic kidney disease, stage 3a   . Acquired absence of right leg below knee ([place]) 09/07/2018  . Gangrene of right foot ([place])   . Goals of care, counseling/discussion   . Palliative care by specialist   . Type 2 diabetes mellitus with vascular disease ([place])   . Severe protein-calorie malnutrition ([place])   . Ischemic foot 08/20/2018  . Critical lower limb ischemia ([place]) 08/03/2018  . Type 2 diabetes mellitus with foot ulcer, without long-term current use of insulin ([place])   . Gastroesophageal reflux disease   . Cellulitis of great toe of right foot 05/29/2018  . Atrial fibrillation, chronic   . Chest pain 07/25/2017  . PAF (paroxysmal atrial fibrillation) ([place]) 07/25/2017  . BPH (benign prostatic hyperplasia) 07/25/2017  . Shortness of breath 05/11/2017  . Hypothyroidism 05/11/2017  . Chronic diastolic CHF (congestive heart failure) ([place]) 05/11/2017  . Moderate aortic stenosis 06/12/2016  . RBBB 06/12/2016  . Peripheral vascular disease ([place]) 08/04/2012  . Essential hypertension, benign 08/04/2012  . Hyperlipidemia 08/04/2012  . Dizziness 08/04/2012    Jamey Reas, PT, DPT 05/24/2020, 1:01 PM  [facility] [address] [place], [place], [address] Phone: [phone]   Fax:  [phone]  Name: [name] MRN: [id number] Date of Birth: 22-Aug-1927

## 2020-05-25 ENCOUNTER — Ambulatory Visit: Payer: Medicare Other | Admitting: Physical Therapy

## 2020-05-25 ENCOUNTER — Other Ambulatory Visit: Payer: Self-pay

## 2020-05-25 ENCOUNTER — Encounter: Payer: Self-pay | Admitting: Physical Therapy

## 2020-05-25 DIAGNOSIS — M25662 Stiffness of left knee, not elsewhere classified: Secondary | ICD-10-CM

## 2020-05-25 DIAGNOSIS — R600 Localized edema: Secondary | ICD-10-CM

## 2020-05-25 DIAGNOSIS — M25661 Stiffness of right knee, not elsewhere classified: Secondary | ICD-10-CM | POA: Diagnosis not present

## 2020-05-25 DIAGNOSIS — R2681 Unsteadiness on feet: Secondary | ICD-10-CM

## 2020-05-25 DIAGNOSIS — R293 Abnormal posture: Secondary | ICD-10-CM | POA: Diagnosis not present

## 2020-05-25 DIAGNOSIS — M6281 Muscle weakness (generalized): Secondary | ICD-10-CM | POA: Diagnosis not present

## 2020-05-25 DIAGNOSIS — M6249 Contracture of muscle, multiple sites: Secondary | ICD-10-CM

## 2020-05-25 DIAGNOSIS — R2689 Other abnormalities of gait and mobility: Secondary | ICD-10-CM | POA: Diagnosis not present

## 2020-05-25 NOTE — Therapy (Signed)
[facility] Physical Therapy [address] [place], [place], [address] Phone: [phone]   Fax:  [phone]  Physical Therapy Treatment  Patient Details  Name: [name] MRN: [id number] Date of Birth: 1927-12-21 Referring Provider (PT): [name], Travis   Encounter Date: 05/25/2020   PT End of Session - 05/25/20 1259    Visit Number 2    Number of Visits 25    Date for PT Re-Evaluation 08/17/20    Authorization Type UHC Medicare    Progress Note Due on Visit 10    PT Start Time 1300    PT Stop Time 1345    PT Time Calculation (min) 45 min    Equipment Utilized During Treatment Gait belt    Activity Tolerance Patient tolerated treatment well;Patient limited by fatigue    Behavior During Therapy [facility] for tasks assessed/performed           Past Medical History:  Diagnosis Date  . Arthritis   . Borderline diabetic   . CHF (congestive heart failure) ([place])   . Chronic kidney disease   . Diabetes mellitus without complication ([place])   . Dysrhythmia   . Glaucoma   . Hyperlipidemia   . Hypertension   . Hypothyroidism   . Myocardial infarction ([place])   . PVD (peripheral vascular disease) ([place])   . Sleep apnea    Cpap ordered but doesnt use  . Urinary retention due to benign prostatic hyperplasia 12/28/2018    Past Surgical History:  Procedure Laterality Date  . ABDOMINAL AORTOGRAM W/LOWER EXTREMITY N/A 07/27/2018   Procedure: ABDOMINAL AORTOGRAM W/LOWER EXTREMITY;  Surgeon: Lorretta Harp, Travis;  Location: [place] [facility];  Service: Cardiovascular;  Laterality: N/A;  . AMPUTATION Right 08/28/2018   Procedure: RIGHT BELOW KNEE AMPUTATION;  Surgeon: [name], Travis;  Location: [place];  Service: Orthopedics;  Laterality: Right;  . AMPUTATION Left 12/02/2018   Procedure: LEFT TRANSMETATARSAL AMPUTATION AND NEGATIVE PRESSURE WOUND VAC PLACEMENT;  Surgeon: [name], Travis;  Location: [place];  Service: Orthopedics;  Laterality: Left;  . AMPUTATION  Left 12/18/2018   Procedure: LEFT BELOW KNEE AMPUTATION;  Surgeon: [name], Travis;  Location: [place];  Service: Orthopedics;  Laterality: Left;  . APPENDECTOMY  1943  . BELOW KNEE LEG AMPUTATION Left 12/18/2018  . CATARACT EXTRACTION  2012   x2  . COLONOSCOPY N/A 11/01/2014   Procedure: COLONOSCOPY;  Surgeon: Aviva Signs, Travis;  [facility];  Service: Gastroenterology;  Laterality: N/A;  . ESOPHAGOGASTRODUODENOSCOPY N/A 11/01/2014   Procedure: ESOPHAGOGASTRODUODENOSCOPY (EGD);  Surgeon: Aviva Signs, Travis;  [facility];  Service: Gastroenterology;  Laterality: N/A;  . HERNIA REPAIR Left   . INGUINAL HERNIA REPAIR Right 03/01/2013   Procedure: RIGHT INGUINAL HERNIORRHAPHY;  Surgeon: Jamesetta So, Travis;  Location: [facility];  Service: General;  Laterality: Right;  . INSERTION OF MESH Right 03/01/2013   Procedure: INSERTION OF MESH;  Surgeon: Jamesetta So, Travis;  Location: [facility];  Service: General;  Laterality: Right;  . LOWER EXTREMITY ANGIOGRAPHY Right 08/25/2018   Procedure: LOWER EXTREMITY ANGIOGRAPHY;  Surgeon: Wellington Hampshire, Travis;  Location: [place] [facility];  Service: Cardiovascular;  Laterality: Right;  . LOWER EXTREMITY ANGIOGRAPHY N/A 11/25/2018   Procedure: LOWER EXTREMITY ANGIOGRAPHY;  Surgeon: Wellington Hampshire, Travis;  Location: [place] [facility];  Service: Cardiovascular;  Laterality: N/A;  . NM MYOCAR PERF WALL MOTION  06/05/2010   no significant ischemia  . PERIPHERAL VASCULAR  ATHERECTOMY Right 08/03/2018   Procedure: PERIPHERAL VASCULAR ATHERECTOMY;  Surgeon: Lorretta Harp, Travis;  Location: [place] [facility];  Service: Cardiovascular;  Laterality: Right;  . PERIPHERAL VASCULAR ATHERECTOMY Left 11/23/2018   Procedure: PERIPHERAL VASCULAR ATHERECTOMY;  Surgeon: Lorretta Harp, Travis;  Location: [place] [facility];  Service: Cardiovascular;  Laterality: Left;  sfa with dc balloon  . PERIPHERAL VASCULAR BALLOON ANGIOPLASTY Left 11/25/2018   Procedure:  PERIPHERAL VASCULAR BALLOON ANGIOPLASTY;  Surgeon: Wellington Hampshire, Travis;  Location: [place] [facility];  Service: Cardiovascular;  Laterality: Left;  popliteal  . PERIPHERAL VASCULAR INTERVENTION Left 11/25/2018   Procedure: PERIPHERAL VASCULAR INTERVENTION;  Surgeon: Wellington Hampshire, Travis;  Location: [place] [facility];  Service: Cardiovascular;  Laterality: Left;  tibial peroneal trunk and peroneal stents   . stent  06/14/2010   left leg  . STUMP REVISION Left 01/20/2019   Procedure: REVISION LEFT BELOW KNEE AMPUTATION;  Surgeon: [name], Travis;  Location: [place];  Service: Orthopedics;  Laterality: Left;  . US ECHOCARDIOGRAPHY  01/21/2006   moderate mitral annular ca+, mild MR, AOV moderately sclerotic.    There were no vitals filed for this visit.   Subjective Assessment - 05/25/20 1300    Subjective No issues from evaluation.  His blood glucose is not running high now. He has ~3 more days of full dose antibiodic for UTI and then low dose .    Patient is accompained by: Family member    Pertinent History CAD, NSTEMI, bil TTA, TIA, HTN, DM, CKD stage IV, A-Fib, CHF, aortic stenosis    Limitations Standing;Walking;House hold activities    Patient Stated Goals To be able to walk safely with family in home.    Currently in Pain? No/denies                             [facility] Adult PT Treatment/Exercise - 05/25/20 1300      Transfers   Transfers Sit to Stand;Stand to Sit    Sit to Stand 3: Mod assist;With upper extremity assist;With armrests;From chair/3-in-1;Other (comment);4: Min assist   to sink   Stand to Sit 4: Min guard;With upper extremity assist;With armrests;To chair/3-in-1;Other (comment)   from sink   Comments scooting forward to edge of chair <> scooting back into chair - demo, verbal & manual cues on technique with lateral trunk lean.  Min to [facility].            Access Code: [email or web address] URL: [email or web address] Date: 05/25/2020 Prepared by:  Jamey Reas  Exercises Seated on 24" bar stool Seated Hip Flexion Toward Target - 1 x daily - 7 x weekly - 1 sets - 10 reps - 5 seconds hold Seated Eccentric Abdominal Lean Back - 1 x daily - 7 x weekly - 1 sets - 10 reps - 5 seconds hold Seated Sidebending - 1 x daily - 7 x weekly - 1 sets - 10 reps - 5 seconds hold Seated Trunk Rotation with Crossed Arms - 1 x daily - 7 x weekly - 1 sets - 10 reps - 5 seconds hold Sit to Stand with Counter Support - 1 x daily - 7 x weekly - 2 sets - 5 reps - 5 seconds hold  Seated in his w/c Seated Scooting Pelvis Forward and Backward - 1 x daily - 7 x weekly - 1 sets - 3 reps - 1 seconds hold Seated Passive Knee Extension - 2-3  x daily - 7 x weekly - 1 sets - 1 reps - 2 minutes hold      PT Education - 05/25/20 1348    Education Details Medbridge   [email or web address]    Person(s) Educated Patient;Child(ren)    Methods Explanation;Demonstration;Tactile cues;Verbal cues;Handout    Comprehension Verbalized understanding;Returned demonstration;Verbal cues required;Tactile cues required;Need further instruction            PT Short Term Goals - 05/23/20 1757      PT SHORT TERM GOAL #1   Title Patient sit to/from stand w/c to RW and weight shifts to stable balanced position with minA    Time 1    Period Months    Status New    Target Date 06/23/20      PT SHORT TERM GOAL #2   Title Patient able to stand 2 min with RW support static with contact assist.    Time 1    Period Months    Status New    Target Date 06/23/20      PT SHORT TERM GOAL #3   Title Patient ambulates 60' with RW & prosthesis with modA including 90* turn to position to sit.    Time 1    Period Months    Status New    Target Date 06/23/20             PT Long Term Goals - 05/23/20 1754      PT LONG TERM GOAL #1   Title Patient and family verbalize and demonstrate understanding of proper prosthetic care to enable safe utilization of prostheses.    Time 3    Period Months     Status New    Target Date 08/17/20      PT LONG TERM GOAL #2   Title Patient tolerates wear of bilateral prostheses >90% of awake hours without skin issues or limb pain to enable functional potential during his day.    Time 3    Period Months    Status New    Target Date 08/17/20      PT LONG TERM GOAL #3   Title Sit to / from stand elevated w/c to walker with prostheses with minA    Time 3    Period Months    Status New    Target Date 08/17/20      PT LONG TERM GOAL #4   Title Standing balance with walker support with bilateral prostheses static stance for 2 minutes, scanning environment and reaches 2" anteriorly with supervision.    Time 3    Period Months    Status New    Target Date 08/17/20      PT LONG TERM GOAL #5   Title Patient ambulates 67' between 2 chairs including turning 90* to position to sit with RW & prostheses with minA from family safely.    Time 3    Period Months    Status New    Target Date 08/17/20                 Plan - 05/25/20 1259    Clinical Impression Statement PT instructed in HEP at sink seated on 24" bar stool.  Pt's son appears to understand to assist pt at home.  Both verbalize understanding of need for exercising outside of PT to augment PT & progress.    Personal Factors and Comorbidities Age;Comorbidity 3+;Time since onset of injury/illness/exacerbation;Past/Current Experience;Fitness    Comorbidities bil TTAs, CAD, NSTEMI, bil TTA,  TIA, HTN, DM, CKD stage IV, A-Fib, CHF, aortic stenosis    Examination-Activity Limitations Bed Mobility;Locomotion Level;Stand;Transfers    Stability/Clinical Decision Making Evolving/Moderate complexity    Rehab Potential Good    PT Frequency 2x / week    PT Duration 12 weeks    PT Treatment/Interventions ADLs/Self Care Home Management;DME Instruction;Gait training;Stair training;Functional mobility training;Therapeutic activities;Therapeutic exercise;Balance training;Neuromuscular  re-education;Patient/family education;Prosthetic Training;Passive range of motion    PT Next Visit Plan review prosthetic care, balance & gait activities    Consulted and Agree with Plan of Care Patient;Family member/caregiver    Family Member Consulted son, Bryant Saye           Patient will benefit from skilled therapeutic intervention in order to improve the following deficits and impairments:  Abnormal gait,Decreased activity tolerance,Decreased balance,Decreased endurance,Decreased knowledge of use of DME,Decreased mobility,Decreased range of motion,Decreased skin integrity,Decreased strength,Impaired sensation,Postural dysfunction,Prosthetic Dependency  Visit Diagnosis: Unsteadiness on feet  Other abnormalities of gait and mobility  Abnormal posture  Stiffness of right knee, not elsewhere classified  Stiffness of left knee, not elsewhere classified  Contracture of muscle, multiple sites  Muscle weakness (generalized)  Localized edema     Problem List Patient Active Problem List   Diagnosis Date Noted  . Uncontrolled type 2 diabetes mellitus with hyperglycemia, without long-term current use of insulin ([place]) 02/25/2020  . CKD (chronic kidney disease) stage 4, GFR 15-29 ml/min (HCC) 02/25/2020  . Acute UTI   . AMS (altered mental status) 02/24/2020  . PNA (pneumonia) 12/06/2019  . Sepsis due to PNA 12/06/2019  . Acute metabolic encephalopathy 19/62/2297  . AKI (acute kidney injury) ([place])   . Aortic valve stenosis   . Palliative care encounter   . NSTEMI (non-ST elevated myocardial infarction) ([place]) 10/04/2019  . Acute kidney injury superimposed on CKD ([place]) 10/04/2019  . Hypokalemia 10/04/2019  . Weakness generalized 04/05/2019  . S/P BKA (below knee amputation) unilateral, left ([place]) 02/15/2019  . Pressure injury of skin 02/03/2019  . TIA (transient ischemic attack) 02/03/2019  . Transient speech disturbance 02/02/2019  . Chronic indwelling Foley catheter  01/22/2019  . Postoperative wound infection 01/20/2019  . Normocytic anemia 01/18/2019  . Cellulitis 01/18/2019  . Infection of deep incisional surgical site after procedure   . Abnormal urinalysis   . Labile blood glucose   . Urinary retention   . S/P bilateral BKA (below knee amputation) ([place])   . Acute blood loss anemia   . Urinary retention due to benign prostatic hyperplasia 12/28/2018  . Unilateral complete BKA, left, initial encounter ([place]) 12/25/2018  . Dehiscence of amputation stump ([place])   . History of transmetatarsal amputation of left foot ([place]) 12/09/2018  . Critical limb ischemia with history of revascularization of same extremity ([place])   . Gangrene of left foot ([place])   . Diabetic ulcer of toe of left foot associated with type 2 diabetes mellitus ([place])   . Cellulitis of left lower extremity   . Toe infection 11/18/2018  . Peripheral edema   . Acute on chronic diastolic CHF (congestive heart failure) ([place]) 11/09/2018  . S/P BKA (below knee amputation) unilateral, right ([place]) 10/05/2018  . Hyponatremia   . Essential hypertension   . Postoperative pain   . Pain of left heel   . Unable to maintain weight-bearing   . Type 2 diabetes mellitus with diabetic peripheral angiopathy with gangrene ([place])   . Anemia due to acute blood loss   . Chronic kidney disease, stage 3a   .  Acquired absence of right leg below knee ([place]) 09/07/2018  . Gangrene of right foot ([place])   . Goals of care, counseling/discussion   . Palliative care by specialist   . Type 2 diabetes mellitus with vascular disease ([place])   . Severe protein-calorie malnutrition ([place])   . Ischemic foot 08/20/2018  . Critical lower limb ischemia ([place]) 08/03/2018  . Type 2 diabetes mellitus with foot ulcer, without long-term current use of insulin ([place])   . Gastroesophageal reflux disease   . Cellulitis of great toe of right foot 05/29/2018  . Atrial fibrillation, chronic   . Chest pain 07/25/2017  . PAF (paroxysmal  atrial fibrillation) ([place]) 07/25/2017  . BPH (benign prostatic hyperplasia) 07/25/2017  . Shortness of breath 05/11/2017  . Hypothyroidism 05/11/2017  . Chronic diastolic CHF (congestive heart failure) ([place]) 05/11/2017  . Moderate aortic stenosis 06/12/2016  . RBBB 06/12/2016  . Peripheral vascular disease ([place]) 08/04/2012  . Essential hypertension, benign 08/04/2012  . Hyperlipidemia 08/04/2012  . Dizziness 08/04/2012    Jamey Reas, PT, DPT 05/25/2020, 1:52 PM  [facility] Physical Therapy [address] [place], [place], [address] Phone: [phone]   Fax:  [phone]  Name: [name] MRN: [id number] Date of Birth: 19-Sep-1927

## 2020-05-25 NOTE — Patient Instructions (Signed)
Access Code: [email or web address] URL: [email or web address] Date: 05/25/2020 Prepared by: Jamey Reas  Exercises Seated Hip Flexion Toward Target - 1 x daily - 7 x weekly - 1 sets - 10 reps - 5 seconds hold Seated Eccentric Abdominal Lean Back - 1 x daily - 7 x weekly - 1 sets - 10 reps - 5 seconds hold Seated Sidebending - 1 x daily - 7 x weekly - 1 sets - 10 reps - 5 seconds hold Seated Trunk Rotation with Crossed Arms - 1 x daily - 7 x weekly - 1 sets - 10 reps - 5 seconds hold Sit to Stand with Counter Support - 1 x daily - 7 x weekly - 2 sets - 5 reps - 5 seconds hold Seated Scooting Pelvis Forward and Backward - 1 x daily - 7 x weekly - 1 sets - 3 reps - 1 seconds hold Seated Passive Knee Extension - 2-3 x daily - 7 x weekly - 1 sets - 1 reps - 2 minutes hold

## 2020-05-29 ENCOUNTER — Other Ambulatory Visit: Payer: Self-pay | Admitting: *Deleted

## 2020-05-29 NOTE — Patient Outreach (Signed)
[name] [facility]) Care Management  05/29/2020  [name] Jun 01, 1927 [id number]   Outgoing call placed to member's son, [name].  State member has been doing much better.  He did have another UTI that was treated with Cipro 250 mg for long term use.  Was to have appointment with [facility] last week, but was out at outpatient PT.  Son report home visit has been rescheduled for the first week of June.  Denies any urgent concerns, encouraged to contact this care manager with questions.  Agrees to follow up within the next month.  Goals Addressed            This Visit's Progress   . [facility] - Monitor and Manage My Blood Sugar   On track    Timeframe:  Short-Term Goal Priority:  Medium Start Date:          5/16 Expected End Date:   6/16  Barriers: Other - Cognitive, dementia                   - check blood sugar at prescribed times - check blood sugar if I feel it is too high or too low - take the blood sugar log to all doctor visits    Why is this important?   Checking your blood sugar at home helps to keep it from getting very high or very low.  Writing the results in a diary or log helps the doctor know how to care for you.  Your blood sugar log should have the time, date and the results.  Also, write down the amount of insulin or other medicine that you take.  Other information, like what you ate, exercise done and how you were feeling, will also be helpful.     Notes:   11/18 - blood sugar readings reviewed, discussed compliance with medication regime  12/28 - Discussed proper diabetic and low sodium diet  02/11/2020 - Remind of importance of managing blood sugars in correlation to wound healing  2/21 - Confirmed member has ongoing participation with home health for weekly monitoring  3/21 - Encouraged to continue daily blood sugar monitoring as well as recording readings  4/18 - Son report blood sugars range 130-170's, was 219 today.  Relates this to  increased appetite and eating over the Easter holiday.  5/16 - Blood sugars increased to around 300's when on antibiotics for UTI, now decreased back down to 130's    . [facility] - Set My Target A1C   On track    Barriers: Health Behaviors   Timeframe:  Long-Range Goal Priority:  Medium Start Date:       2/21                      Expected End Date:   06/10/2020                      - set target A1C (7)    Why is this important?   Your target A1C is decided together by you and your doctor.  It is based on several things like your age and other health issues.    Notes:   11/18 - Discussed dose change for oral diabetic agents  12/28 - Call placed to PCP office requesting order for new A1C lab  1/28 - Re-educated on DM management in effort to decrease A1C  2/21 - Review CBG trends, encouraged to continue plan of care  3/21 -  Reminded son that controlled CBG's will promote wound healing  4/18 - Most recent A1C - 7.5, son verbalized understanding of ongoing management of blood sugars and consistent A1C within range  5/16 - Member has increased his mobility with outpatient PT, encouraged to continue as increased exercise helps to decrease blood sugars and A1C      Travis David, RN, MSN [place] Manager [phone]

## 2020-05-31 ENCOUNTER — Other Ambulatory Visit: Payer: Self-pay

## 2020-05-31 ENCOUNTER — Encounter: Payer: Self-pay | Admitting: Physical Therapy

## 2020-05-31 ENCOUNTER — Ambulatory Visit: Payer: Medicare Other | Admitting: Physical Therapy

## 2020-05-31 DIAGNOSIS — R2681 Unsteadiness on feet: Secondary | ICD-10-CM | POA: Diagnosis not present

## 2020-05-31 DIAGNOSIS — R293 Abnormal posture: Secondary | ICD-10-CM | POA: Diagnosis not present

## 2020-05-31 DIAGNOSIS — M25661 Stiffness of right knee, not elsewhere classified: Secondary | ICD-10-CM

## 2020-05-31 DIAGNOSIS — M6281 Muscle weakness (generalized): Secondary | ICD-10-CM

## 2020-05-31 DIAGNOSIS — R2689 Other abnormalities of gait and mobility: Secondary | ICD-10-CM

## 2020-05-31 DIAGNOSIS — N342 Other urethritis: Secondary | ICD-10-CM | POA: Diagnosis not present

## 2020-05-31 DIAGNOSIS — M25662 Stiffness of left knee, not elsewhere classified: Secondary | ICD-10-CM | POA: Diagnosis not present

## 2020-05-31 DIAGNOSIS — M6249 Contracture of muscle, multiple sites: Secondary | ICD-10-CM

## 2020-05-31 NOTE — Therapy (Signed)
[facility] [address] [place], [place], [address] Phone: [phone]   Fax:  [phone]  Physical Therapy Treatment  Patient Details  Name: [name] MRN: [id number] Date of Birth: 12-29-1927 Referring Provider (PT): [name], MD   Encounter Date: 05/31/2020   PT End of Session - 05/31/20 1024    Visit Number 3    Number of Visits 25    Date for PT Re-Evaluation 08/17/20    Authorization Type [employer] Medicare    Progress Note Due on Visit 10    PT Start Time 1017    PT Stop Time 1100    PT Time Calculation (min) 43 min    Equipment Utilized During Treatment Gait belt    Activity Tolerance Patient tolerated treatment well;Patient limited by fatigue    Behavior During Therapy [facility] for tasks assessed/performed           Past Medical History:  Diagnosis Date  . Arthritis   . Borderline diabetic   . CHF (congestive heart failure) ([place])   . Chronic kidney disease   . Diabetes mellitus without complication ([place])   . Dysrhythmia   . Glaucoma   . Hyperlipidemia   . Hypertension   . Hypothyroidism   . Myocardial infarction ([place])   . PVD (peripheral vascular disease) ([place])   . Sleep apnea    Cpap ordered but doesnt use  . Urinary retention due to benign prostatic hyperplasia 12/28/2018    Past Surgical History:  Procedure Laterality Date  . ABDOMINAL AORTOGRAM W/LOWER EXTREMITY N/A 07/27/2018   Procedure: ABDOMINAL AORTOGRAM W/LOWER EXTREMITY;  Surgeon: Lorretta Harp, MD;  Location: [place] [facility];  Service: Cardiovascular;  Laterality: N/A;  . AMPUTATION Right 08/28/2018   Procedure: RIGHT BELOW KNEE AMPUTATION;  Surgeon: [name], MD;  Location: [place];  Service: Orthopedics;  Laterality: Right;  . AMPUTATION Left 12/02/2018   Procedure: LEFT TRANSMETATARSAL AMPUTATION AND NEGATIVE PRESSURE WOUND VAC PLACEMENT;  Surgeon: [name], MD;  Location: [place];  Service: Orthopedics;  Laterality: Left;  . AMPUTATION  Left 12/18/2018   Procedure: LEFT BELOW KNEE AMPUTATION;  Surgeon: [name], MD;  Location: [place];  Service: Orthopedics;  Laterality: Left;  . APPENDECTOMY  1943  . BELOW KNEE LEG AMPUTATION Left 12/18/2018  . CATARACT EXTRACTION  2012   x2  . COLONOSCOPY N/A 11/01/2014   Procedure: COLONOSCOPY;  Surgeon: Aviva Signs, MD;  [facility];  Service: Gastroenterology;  Laterality: N/A;  . ESOPHAGOGASTRODUODENOSCOPY N/A 11/01/2014   Procedure: ESOPHAGOGASTRODUODENOSCOPY (EGD);  Surgeon: Aviva Signs, MD;  [facility];  Service: Gastroenterology;  Laterality: N/A;  . HERNIA REPAIR Left   . INGUINAL HERNIA REPAIR Right 03/01/2013   Procedure: RIGHT INGUINAL HERNIORRHAPHY;  Surgeon: Jamesetta So, MD;  Location: [facility];  Service: General;  Laterality: Right;  . INSERTION OF MESH Right 03/01/2013   Procedure: INSERTION OF MESH;  Surgeon: Jamesetta So, MD;  [facility];  Service: General;  Laterality: Right;  . LOWER EXTREMITY ANGIOGRAPHY Right 08/25/2018   Procedure: LOWER EXTREMITY ANGIOGRAPHY;  Surgeon: Wellington Hampshire, MD;  Location: [place] [facility];  Service: Cardiovascular;  Laterality: Right;  . LOWER EXTREMITY ANGIOGRAPHY N/A 11/25/2018   Procedure: LOWER EXTREMITY ANGIOGRAPHY;  Surgeon: Wellington Hampshire, MD;  Location: [place] [facility];  Service: Cardiovascular;  Laterality: N/A;  . NM MYOCAR PERF WALL MOTION  06/05/2010   no significant ischemia  . PERIPHERAL VASCULAR  ATHERECTOMY Right 08/03/2018   Procedure: PERIPHERAL VASCULAR ATHERECTOMY;  Surgeon: Lorretta Harp, MD;  Location: [place] [facility];  Service: Cardiovascular;  Laterality: Right;  . PERIPHERAL VASCULAR ATHERECTOMY Left 11/23/2018   Procedure: PERIPHERAL VASCULAR ATHERECTOMY;  Surgeon: Lorretta Harp, MD;  Location: [place] [facility];  Service: Cardiovascular;  Laterality: Left;  sfa with dc balloon  . PERIPHERAL VASCULAR BALLOON ANGIOPLASTY Left 11/25/2018   Procedure:  PERIPHERAL VASCULAR BALLOON ANGIOPLASTY;  Surgeon: Wellington Hampshire, MD;  Location: [place] [facility];  Service: Cardiovascular;  Laterality: Left;  popliteal  . PERIPHERAL VASCULAR INTERVENTION Left 11/25/2018   Procedure: PERIPHERAL VASCULAR INTERVENTION;  Surgeon: Wellington Hampshire, MD;  Location: [place] [facility];  Service: Cardiovascular;  Laterality: Left;  tibial peroneal trunk and peroneal stents   . stent  06/14/2010   left leg  . STUMP REVISION Left 01/20/2019   Procedure: REVISION LEFT BELOW KNEE AMPUTATION;  Surgeon: [name], MD;  Location: [place];  Service: Orthopedics;  Laterality: Left;  . US ECHOCARDIOGRAPHY  01/21/2006   moderate mitral annular ca+, mild MR, AOV moderately sclerotic.    There were no vitals filed for this visit.   Subjective Assessment - 05/31/20 1017    Subjective He did exercises some when [name] was present but he forgot to share present HEP with brother & sister.    Patient is accompained by: Family member    Pertinent History CAD, NSTEMI, bil TTA, TIA, HTN, DM, CKD stage IV, A-Fib, CHF, aortic stenosis    Limitations Standing;Walking;House hold activities    Patient Stated Goals To be able to walk safely with family in home.    Currently in Pain? No/denies                             [facility] Adult PT Treatment/Exercise - 05/31/20 1017      Transfers   Transfers Sit to Stand;Stand to [employer];Lateral/Scoot Transfers    Sit to Stand 3: Mod assist;From elevated surface;With upper extremity assist;With armrests;From chair/3-in-1;Other (comment)   ton RW   Sit to Stand Details Tactile cues for weight shifting;Verbal cues for technique    Stand to Sit 4: Min assist;With upper extremity assist;With armrests;To elevated surface;To chair/3-in-1;Other (comment)   from RW   Stand to Sit Details (indicate cue type and reason) Tactile cues for weight shifting;Verbal cues for technique    Stand Pivot Transfers 3: Mod  assist;With armrests   with RW   Stand Pivot Transfer Details (indicate cue type and reason) manual / tactile & verbal cues for technique    Lateral/Scoot Transfers 3: Mod assist   scooting forward & back in w/c   Lateral/Scoot Transfer Details (indicate cue type and reason) weight shift and pelvic movement      Ambulation/Gait   Ambulation/Gait Yes    Ambulation/Gait Assistance 2: Max assist    Ambulation/Gait Assistance Details manual/tactile & verbal cues on upright posture, RW control, wt shift in stance & clearing LE in swing. Turning 90* to sit in chair required increased assist & PT to move chair behind pt <1/3 way into turn.    Ambulation Distance (Feet) 25 Feet    Assistive device Rolling walker;Prostheses      Neuro Re-ed    Neuro Re-ed Details  standing in //bars - once PT manually shifts weight over feet then modA for head turns up/down & right /left 5 reps each and  maxA to reach single hand to PT shoulder directly in front of him.      Prosthetics   Current prosthetic wear tolerance (days/week)  daily    Current prosthetic wear tolerance (#hours/day)  son reports most of awake hours    Current prosthetic weight-bearing tolerance (hours/day)  2 minutes standing without pain in limbs.     Edema non-pitting    Residual limb condition  deep pink to skin especially proximal to knee, no open areas but frail appearing skin,  no hair growth, min to no hair growth, cylinderical shape                    PT Short Term Goals - 05/23/20 1757      PT SHORT TERM GOAL #1   Title Patient sit to/from stand w/c to RW and weight shifts to stable balanced position with minA    Time 1    Period Months    Status New    Target Date 06/23/20      PT SHORT TERM GOAL #2   Title Patient able to stand 2 min with RW support static with contact assist.    Time 1    Period Months    Status New    Target Date 06/23/20      PT SHORT TERM GOAL #3   Title Patient ambulates 74' with RW &  prosthesis with modA including 90* turn to position to sit.    Time 1    Period Months    Status New    Target Date 06/23/20             PT Long Term Goals - 05/23/20 1754      PT LONG TERM GOAL #1   Title Patient and family verbalize and demonstrate understanding of proper prosthetic care to enable safe utilization of prostheses.    Time 3    Period Months    Status New    Target Date 08/17/20      PT LONG TERM GOAL #2   Title Patient tolerates wear of bilateral prostheses >90% of awake hours without skin issues or limb pain to enable functional potential during his day.    Time 3    Period Months    Status New    Target Date 08/17/20      PT LONG TERM GOAL #3   Title Sit to / from stand elevated w/c to walker with prostheses with minA    Time 3    Period Months    Status New    Target Date 08/17/20      PT LONG TERM GOAL #4   Title Standing balance with walker support with bilateral prostheses static stance for 2 minutes, scanning environment and reaches 2" anteriorly with supervision.    Time 3    Period Months    Status New    Target Date 08/17/20      PT LONG TERM GOAL #5   Title Patient ambulates 19' between 2 chairs including turning 90* to position to sit with RW & prostheses with minA from family safely.    Time 3    Period Months    Status New    Target Date 08/17/20                 Plan - 05/31/20 1024    Clinical Impression Statement PT began work on standing balance in //bars requiring mod to maxA.  PT needs to work  on stand pivot transfers so he can turn to position in front of 2nd chair safely.    Personal Factors and Comorbidities Age;Comorbidity 3+;Time since onset of injury/illness/exacerbation;Past/Current Experience;Fitness    Comorbidities bil TTAs, CAD, NSTEMI, bil TTA, TIA, HTN, DM, CKD stage IV, A-Fib, CHF, aortic stenosis    Examination-Activity Limitations Bed Mobility;Locomotion Level;Stand;Transfers    Stability/Clinical  Decision Making Evolving/Moderate complexity    Rehab Potential Good    PT Frequency 2x / week    PT Duration 12 weeks    PT Treatment/Interventions ADLs/Self Care Home Management;DME Instruction;Gait training;Stair training;Functional mobility training;Therapeutic activities;Therapeutic exercise;Balance training;Neuromuscular re-education;Patient/family education;Prosthetic Training;Passive range of motion    PT Next Visit Plan review prosthetic care, balance & gait activities    Consulted and Agree with Plan of Care Patient;Family member/caregiver    Family Member Consulted son, Latavious Bitter           Patient will benefit from skilled therapeutic intervention in order to improve the following deficits and impairments:  Abnormal gait,Decreased activity tolerance,Decreased balance,Decreased endurance,Decreased knowledge of use of DME,Decreased mobility,Decreased range of motion,Decreased skin integrity,Decreased strength,Impaired sensation,Postural dysfunction,Prosthetic Dependency  Visit Diagnosis: Unsteadiness on feet  Other abnormalities of gait and mobility  Abnormal posture  Stiffness of right knee, not elsewhere classified  Stiffness of left knee, not elsewhere classified  Contracture of muscle, multiple sites  Muscle weakness (generalized)     Problem List Patient Active Problem List   Diagnosis Date Noted  . Uncontrolled type 2 diabetes mellitus with hyperglycemia, without long-term current use of insulin ([place]) 02/25/2020  . CKD (chronic kidney disease) stage 4, GFR 15-29 ml/min (H[facility]) 02/25/2020  . Acute UTI   . AMS (altered mental status) 02/24/2020  . PNA (pneumonia) 12/06/2019  . Sepsis due to PNA 12/06/2019  . Acute metabolic encephalopathy 22/97/9892  . AKI (acute kidney injury) ([place])   . Aortic valve stenosis   . Palliative care encounter   . NSTEMI (non-ST elevated myocardial infarction) ([place]) 10/04/2019  . Acute kidney injury superimposed on CKD ([place])  10/04/2019  . Hypokalemia 10/04/2019  . Weakness generalized 04/05/2019  . S/P BKA (below knee amputation) unilateral, left ([place]) 02/15/2019  . Pressure injury of skin 02/03/2019  . TIA (transient ischemic attack) 02/03/2019  . Transient speech disturbance 02/02/2019  . Chronic indwelling Foley catheter 01/22/2019  . Postoperative wound infection 01/20/2019  . Normocytic anemia 01/18/2019  . Cellulitis 01/18/2019  . Infection of deep incisional surgical site after procedure   . Abnormal urinalysis   . Labile blood glucose   . Urinary retention   . S/P bilateral BKA (below knee amputation) ([place])   . Acute blood loss anemia   . Urinary retention due to benign prostatic hyperplasia 12/28/2018  . Unilateral complete BKA, left, initial encounter ([place]) 12/25/2018  . Dehiscence of amputation stump ([place])   . History of transmetatarsal amputation of left foot ([place]) 12/09/2018  . Critical limb ischemia with history of revascularization of same extremity ([place])   . Gangrene of left foot ([place])   . Diabetic ulcer of toe of left foot associated with type 2 diabetes mellitus ([place])   . Cellulitis of left lower extremity   . Toe infection 11/18/2018  . Peripheral edema   . Acute on chronic diastolic CHF (congestive heart failure) ([place]) 11/09/2018  . S/P BKA (below knee amputation) unilateral, right ([place]) 10/05/2018  . Hyponatremia   . Essential hypertension   . Postoperative pain   . Pain of left heel   . Unable  to maintain weight-bearing   . Type 2 diabetes mellitus with diabetic peripheral angiopathy with gangrene ([place])   . Anemia due to acute blood loss   . Chronic kidney disease, stage 3a   . Acquired absence of right leg below knee ([place]) 09/07/2018  . Gangrene of right foot ([place])   . Goals of care, counseling/discussion   . Palliative care by specialist   . Type 2 diabetes mellitus with vascular disease ([place])   . Severe protein-calorie malnutrition ([place])   . Ischemic foot 08/20/2018  .  Critical lower limb ischemia ([place]) 08/03/2018  . Type 2 diabetes mellitus with foot ulcer, without long-term current use of insulin ([place])   . Gastroesophageal reflux disease   . Cellulitis of great toe of right foot 05/29/2018  . Atrial fibrillation, chronic   . Chest pain 07/25/2017  . PAF (paroxysmal atrial fibrillation) ([place]) 07/25/2017  . BPH (benign prostatic hyperplasia) 07/25/2017  . Shortness of breath 05/11/2017  . Hypothyroidism 05/11/2017  . Chronic diastolic CHF (congestive heart failure) ([place]) 05/11/2017  . Moderate aortic stenosis 06/12/2016  . RBBB 06/12/2016  . Peripheral vascular disease ([place]) 08/04/2012  . Essential hypertension, benign 08/04/2012  . Hyperlipidemia 08/04/2012  . Dizziness 08/04/2012    Jamey Reas, PT, DPT 05/31/2020, 12:47 PM  [facility] [address] [place], [place], [address] Phone: [phone]   Fax:  [phone]  Name: [name] MRN: [id number] Date of Birth: 06/06/1927

## 2020-06-02 ENCOUNTER — Other Ambulatory Visit: Payer: Self-pay | Admitting: Physician Assistant

## 2020-06-06 ENCOUNTER — Encounter: Payer: Self-pay | Admitting: Physical Therapy

## 2020-06-06 ENCOUNTER — Other Ambulatory Visit: Payer: Self-pay

## 2020-06-06 ENCOUNTER — Ambulatory Visit: Payer: Medicare Other | Admitting: Physical Therapy

## 2020-06-06 DIAGNOSIS — M25661 Stiffness of right knee, not elsewhere classified: Secondary | ICD-10-CM

## 2020-06-06 DIAGNOSIS — M6281 Muscle weakness (generalized): Secondary | ICD-10-CM

## 2020-06-06 DIAGNOSIS — R2681 Unsteadiness on feet: Secondary | ICD-10-CM | POA: Diagnosis not present

## 2020-06-06 DIAGNOSIS — M6249 Contracture of muscle, multiple sites: Secondary | ICD-10-CM

## 2020-06-06 DIAGNOSIS — R293 Abnormal posture: Secondary | ICD-10-CM | POA: Diagnosis not present

## 2020-06-06 DIAGNOSIS — M25662 Stiffness of left knee, not elsewhere classified: Secondary | ICD-10-CM

## 2020-06-06 DIAGNOSIS — R2689 Other abnormalities of gait and mobility: Secondary | ICD-10-CM

## 2020-06-06 NOTE — Therapy (Signed)
[facility] [address] [place], [place], [address] Phone: [phone]   Fax:  [phone]  Physical Therapy Treatment  Patient Details  Name: [name] MRN: [id number] Date of Birth: [birth year] Referring Provider (PT): [name], MD   Encounter Date: 06/06/2020   PT End of Session - 06/06/20 0918    Visit Number 4    Number of Visits 25    Date for PT Re-Evaluation 08/17/20    Authorization Type [employer] Medicare    Progress Note Due on Visit 10    PT Start Time 0920    PT Stop Time 1008    PT Time Calculation (min) 48 min    Equipment Utilized During Treatment Gait belt    Activity Tolerance Patient tolerated treatment well;Patient limited by fatigue    Behavior During Therapy [facility] for tasks assessed/performed           Past Medical History:  Diagnosis Date  . Arthritis   . Borderline diabetic   . CHF (congestive heart failure) ([place])   . Chronic kidney disease   . Diabetes mellitus without complication ([place])   . Dysrhythmia   . Glaucoma   . Hyperlipidemia   . Hypertension   . Hypothyroidism   . Myocardial infarction ([place])   . PVD (peripheral vascular disease) ([place])   . Sleep apnea    Cpap ordered but doesnt use  . Urinary retention due to benign prostatic hyperplasia 12/28/2018    Past Surgical History:  Procedure Laterality Date  . ABDOMINAL AORTOGRAM W/LOWER EXTREMITY N/A 07/27/2018   Procedure: ABDOMINAL AORTOGRAM W/LOWER EXTREMITY;  Surgeon: Lorretta Harp, MD;  Location: [place] [facility];  Service: Cardiovascular;  Laterality: N/A;  . AMPUTATION Right 08/28/2018   Procedure: RIGHT BELOW KNEE AMPUTATION;  Surgeon: [name], MD;  Location: [place];  Service: Orthopedics;  Laterality: Right;  . AMPUTATION Left 12/02/2018   Procedure: LEFT TRANSMETATARSAL AMPUTATION AND NEGATIVE PRESSURE WOUND VAC PLACEMENT;  Surgeon: [name], MD;  Location: [place];  Service: Orthopedics;  Laterality: Left;  . AMPUTATION  Left 12/18/2018   Procedure: LEFT BELOW KNEE AMPUTATION;  Surgeon: [name], MD;  Location: [place];  Service: Orthopedics;  Laterality: Left;  . APPENDECTOMY  1943  . BELOW KNEE LEG AMPUTATION Left 12/18/2018  . CATARACT EXTRACTION  2012   x2  . COLONOSCOPY N/A 11/01/2014   Procedure: COLONOSCOPY;  Surgeon: Aviva Signs, MD;  [facility];  Service: Gastroenterology;  Laterality: N/A;  . ESOPHAGOGASTRODUODENOSCOPY N/A 11/01/2014   Procedure: ESOPHAGOGASTRODUODENOSCOPY (EGD);  Surgeon: Aviva Signs, MD;  [facility];  Service: Gastroenterology;  Laterality: N/A;  . HERNIA REPAIR Left   . INGUINAL HERNIA REPAIR Right 03/01/2013   Procedure: RIGHT INGUINAL HERNIORRHAPHY;  Surgeon: Jamesetta So, MD;  Location: [facility];  Service: General;  Laterality: Right;  . INSERTION OF MESH Right 03/01/2013   Procedure: INSERTION OF MESH;  Surgeon: Jamesetta So, MD;  [facility]: [facility];  Service: General;  Laterality: Right;  . LOWER EXTREMITY ANGIOGRAPHY Right 08/25/2018   Procedure: LOWER EXTREMITY ANGIOGRAPHY;  Surgeon: Wellington Hampshire, MD;  Location: [place] [facility];  Service: Cardiovascular;  Laterality: Right;  . LOWER EXTREMITY ANGIOGRAPHY N/A 11/25/2018   Procedure: LOWER EXTREMITY ANGIOGRAPHY;  Surgeon: Wellington Hampshire, MD;  Location: [place] [facility];  Service: Cardiovascular;  Laterality: N/A;  . NM MYOCAR PERF WALL MOTION  06/05/2010   no significant ischemia  . PERIPHERAL VASCULAR  ATHERECTOMY Right 08/03/2018   Procedure: PERIPHERAL VASCULAR ATHERECTOMY;  Surgeon: Lorretta Harp, MD;  Location: [place] [facility];  Service: Cardiovascular;  Laterality: Right;  . PERIPHERAL VASCULAR ATHERECTOMY Left 11/23/2018   Procedure: PERIPHERAL VASCULAR ATHERECTOMY;  Surgeon: Lorretta Harp, MD;  Location: [place] [facility];  Service: Cardiovascular;  Laterality: Left;  sfa with dc balloon  . PERIPHERAL VASCULAR BALLOON ANGIOPLASTY Left 11/25/2018   Procedure:  PERIPHERAL VASCULAR BALLOON ANGIOPLASTY;  Surgeon: Wellington Hampshire, MD;  Location: [place] [facility];  Service: Cardiovascular;  Laterality: Left;  popliteal  . PERIPHERAL VASCULAR INTERVENTION Left 11/25/2018   Procedure: PERIPHERAL VASCULAR INTERVENTION;  Surgeon: Wellington Hampshire, MD;  Location: [place] [facility];  Service: Cardiovascular;  Laterality: Left;  tibial peroneal trunk and peroneal stents   . stent  06/14/2010   left leg  . STUMP REVISION Left 01/20/2019   Procedure: REVISION LEFT BELOW KNEE AMPUTATION;  Surgeon: [name], MD;  Location: [place];  Service: Orthopedics;  Laterality: Left;  . US ECHOCARDIOGRAPHY  01/21/2006   moderate mitral annular ca+, mild MR, AOV moderately sclerotic.    There were no vitals filed for this visit.   Subjective Assessment - 06/06/20 0919    Subjective He had a spell on Sunday morning where he was talking jibberish.  Cousin who is PA saw him in afternoon & was recovered.  No other issues.    Patient is accompained by: Family member    Pertinent History CAD, NSTEMI, bil TTA, TIA, HTN, DM, CKD stage IV, A-Fib, CHF, aortic stenosis    Limitations Standing;Walking;House hold activities    Patient Stated Goals To be able to walk safely with family in home.    Currently in Pain? No/denies                             [facility] Adult PT Treatment/Exercise - 06/06/20 0920      Transfers   Transfers Sit to Stand;Stand to [employer];Lateral/Scoot Transfers    Sit to Stand From elevated surface;With upper extremity assist;With armrests;From chair/3-in-1;Other (comment);2: Max assist   ton RW   Sit to Stand Details Tactile cues for weight shifting;Verbal cues for technique    Stand to Sit 3: Mod assist;With upper extremity assist;With armrests;To elevated surface;To chair/3-in-1;Other (comment)   from RW   Stand to Sit Details (indicate cue type and reason) Tactile cues for weight shifting;Verbal cues for technique     Stand Pivot Transfers With armrests;2: Max assist   with RW, 2 person assist.   Stand Pivot Transfer Details (indicate cue type and reason) able to turn feet 90* with 4 steps / leg but unable to back up to chair.    Lateral/Scoot Transfers 2: Max assist;3: Mod assist;With armrests removed   scooting forward & back in w/c 1 person assist modA and w/c to Nustep 2 person assist maxA   Lateral/Scoot Transfer Details (indicate cue type and reason) weight shift and pelvic movement      Ambulation/Gait   Ambulation/Gait Yes    Ambulation/Gait Assistance 2: Max assist   2 person   Ambulation/Gait Assistance Details manual assist for wt shift to stance limb, verbal & tactile/manual for upright posture.    Ambulation Distance (Feet) 2 Feet   2' X 2 today   Assistive device Rolling walker;Prostheses      Neuro Re-ed    Neuro Re-ed Details  standing in RW  for 1 min X 2 with modA once wt positioned over feet.      Exercises   Exercises Knee/Hip      Knee/Hip Exercises: Aerobic   Recumbent Bike level 5 with BUEs & BLEs for 8 min Seat 12      Prosthetics   Current prosthetic wear tolerance (days/week)  daily    Current prosthetic wear tolerance (#hours/day)  son reports most of awake hours    Current prosthetic weight-bearing tolerance (hours/day)  2 minutes standing without pain in limbs.     Edema non-pitting    Residual limb condition  deep pink to skin especially proximal to knee, no open areas but frail appearing skin,  no hair growth, min to no hair growth, cylinderical shape                    PT Short Term Goals - 05/23/20 1757      PT SHORT TERM GOAL #1   Title Patient sit to/from stand w/c to RW and weight shifts to stable balanced position with minA    Time 1    Period Months    Status New    Target Date 06/23/20      PT SHORT TERM GOAL #2   Title Patient able to stand 2 min with RW support static with contact assist.    Time 1    Period Months    Status New     Target Date 06/23/20      PT SHORT TERM GOAL #3   Title Patient ambulates 49' with RW & prosthesis with modA including 90* turn to position to sit.    Time 1    Period Months    Status New    Target Date 06/23/20             PT Long Term Goals - 05/23/20 1754      PT LONG TERM GOAL #1   Title Patient and family verbalize and demonstrate understanding of proper prosthetic care to enable safe utilization of prostheses.    Time 3    Period Months    Status New    Target Date 08/17/20      PT LONG TERM GOAL #2   Title Patient tolerates wear of bilateral prostheses >90% of awake hours without skin issues or limb pain to enable functional potential during his day.    Time 3    Period Months    Status New    Target Date 08/17/20      PT LONG TERM GOAL #3   Title Sit to / from stand elevated w/c to walker with prostheses with minA    Time 3    Period Months    Status New    Target Date 08/17/20      PT LONG TERM GOAL #4   Title Standing balance with walker support with bilateral prostheses static stance for 2 minutes, scanning environment and reaches 2" anteriorly with supervision.    Time 3    Period Months    Status New    Target Date 08/17/20      PT LONG TERM GOAL #5   Title Patient ambulates 42' between 2 chairs including turning 90* to position to sit with RW & prostheses with minA from family safely.    Time 3    Period Months    Status New    Target Date 08/17/20  Plan - 06/06/20 0918    Clinical Impression Statement Patient required significant more assistance today and very limited in gait.  Uncertian if event this weekend was TIA or something else. Son to monitor closely and take to medical care if does not clear.    Personal Factors and Comorbidities Age;Comorbidity 3+;Time since onset of injury/illness/exacerbation;Past/Current Experience;Fitness    Comorbidities bil TTAs, CAD, NSTEMI, bil TTA, TIA, HTN, DM, CKD stage IV, A-Fib, CHF,  aortic stenosis    Examination-Activity Limitations Bed Mobility;Locomotion Level;Stand;Transfers    Stability/Clinical Decision Making Evolving/Moderate complexity    Rehab Potential Good    PT Frequency 2x / week    PT Duration 12 weeks    PT Treatment/Interventions ADLs/Self Care Home Management;DME Instruction;Gait training;Stair training;Functional mobility training;Therapeutic activities;Therapeutic exercise;Balance training;Neuromuscular re-education;Patient/family education;Prosthetic Training;Passive range of motion    PT Next Visit Plan review prosthetic care, balance & gait activities    Consulted and Agree with Plan of Care Patient;Family member/caregiver    Family Member Consulted son, Rockey Guarino           Patient will benefit from skilled therapeutic intervention in order to improve the following deficits and impairments:  Abnormal gait,Decreased activity tolerance,Decreased balance,Decreased endurance,Decreased knowledge of use of DME,Decreased mobility,Decreased range of motion,Decreased skin integrity,Decreased strength,Impaired sensation,Postural dysfunction,Prosthetic Dependency  Visit Diagnosis: Other abnormalities of gait and mobility  Unsteadiness on feet  Abnormal posture  Stiffness of right knee, not elsewhere classified  Stiffness of left knee, not elsewhere classified  Contracture of muscle, multiple sites  Muscle weakness (generalized)     Problem List Patient Active Problem List   Diagnosis Date Noted  . Uncontrolled type 2 diabetes mellitus with hyperglycemia, without long-term current use of insulin ([place]) 02/25/2020  . CKD (chronic kidney disease) stage 4, GFR 15-29 ml/min (H[facility]) 02/25/2020  . Acute UTI   . AMS (altered mental status) 02/24/2020  . PNA (pneumonia) 12/06/2019  . Sepsis due to PNA 12/06/2019  . Acute metabolic encephalopathy 28/00/3491  . AKI (acute kidney injury) ([place])   . Aortic valve stenosis   . Palliative care encounter    . NSTEMI (non-ST elevated myocardial infarction) ([place]) 10/04/2019  . Acute kidney injury superimposed on CKD ([place]) 10/04/2019  . Hypokalemia 10/04/2019  . Weakness generalized 04/05/2019  . S/P BKA (below knee amputation) unilateral, left ([place]) 02/15/2019  . Pressure injury of skin 02/03/2019  . TIA (transient ischemic attack) 02/03/2019  . Transient speech disturbance 02/02/2019  . Chronic indwelling Foley catheter 01/22/2019  . Postoperative wound infection 01/20/2019  . Normocytic anemia 01/18/2019  . Cellulitis 01/18/2019  . Infection of deep incisional surgical site after procedure   . Abnormal urinalysis   . Labile blood glucose   . Urinary retention   . S/P bilateral BKA (below knee amputation) ([place])   . Acute blood loss anemia   . Urinary retention due to benign prostatic hyperplasia 12/28/2018  . Unilateral complete BKA, left, initial encounter ([place]) 12/25/2018  . Dehiscence of amputation stump ([place])   . History of transmetatarsal amputation of left foot ([place]) 12/09/2018  . Critical limb ischemia with history of revascularization of same extremity ([place])   . Gangrene of left foot ([place])   . Diabetic ulcer of toe of left foot associated with type 2 diabetes mellitus ([place])   . Cellulitis of left lower extremity   . Toe infection 11/18/2018  . Peripheral edema   . Acute on chronic diastolic CHF (congestive heart failure) ([place]) 11/09/2018  . S/P BKA (below knee  amputation) unilateral, right ([place]) 10/05/2018  . Hyponatremia   . Essential hypertension   . Postoperative pain   . Pain of left heel   . Unable to maintain weight-bearing   . Type 2 diabetes mellitus with diabetic peripheral angiopathy with gangrene ([place])   . Anemia due to acute blood loss   . Chronic kidney disease, stage 3a   . Acquired absence of right leg below knee ([place]) 09/07/2018  . Gangrene of right foot ([place])   . Goals of care, counseling/discussion   . Palliative care by specialist   . Type 2 diabetes  mellitus with vascular disease ([place])   . Severe protein-calorie malnutrition ([place])   . Ischemic foot 08/20/2018  . Critical lower limb ischemia ([place]) 08/03/2018  . Type 2 diabetes mellitus with foot ulcer, without long-term current use of insulin ([place])   . Gastroesophageal reflux disease   . Cellulitis of great toe of right foot 05/29/2018  . Atrial fibrillation, chronic   . Chest pain 07/25/2017  . PAF (paroxysmal atrial fibrillation) ([place]) 07/25/2017  . BPH (benign prostatic hyperplasia) 07/25/2017  . Shortness of breath 05/11/2017  . Hypothyroidism 05/11/2017  . Chronic diastolic CHF (congestive heart failure) ([place]) 05/11/2017  . Moderate aortic stenosis 06/12/2016  . RBBB 06/12/2016  . Peripheral vascular disease ([place]) 08/04/2012  . Essential hypertension, benign 08/04/2012  . Hyperlipidemia 08/04/2012  . Dizziness 08/04/2012    Jamey Reas, PT, DPT 06/06/2020, 10:09 AM  [facility] Physical Therapy [address] [place], [place], [address] Phone: [phone]   Fax:  [phone]  Name: ISAMAR WELLBROCK MRN: [id number] Date of Birth: [birth year]

## 2020-06-07 ENCOUNTER — Encounter: Payer: Self-pay | Admitting: Physical Therapy

## 2020-06-07 ENCOUNTER — Ambulatory Visit: Payer: Medicare Other | Admitting: Physical Therapy

## 2020-06-07 DIAGNOSIS — R600 Localized edema: Secondary | ICD-10-CM

## 2020-06-07 DIAGNOSIS — R2689 Other abnormalities of gait and mobility: Secondary | ICD-10-CM

## 2020-06-07 DIAGNOSIS — M6249 Contracture of muscle, multiple sites: Secondary | ICD-10-CM

## 2020-06-07 DIAGNOSIS — M25661 Stiffness of right knee, not elsewhere classified: Secondary | ICD-10-CM

## 2020-06-07 DIAGNOSIS — M6281 Muscle weakness (generalized): Secondary | ICD-10-CM

## 2020-06-07 DIAGNOSIS — R2681 Unsteadiness on feet: Secondary | ICD-10-CM | POA: Diagnosis not present

## 2020-06-07 DIAGNOSIS — M25662 Stiffness of left knee, not elsewhere classified: Secondary | ICD-10-CM

## 2020-06-07 DIAGNOSIS — R293 Abnormal posture: Secondary | ICD-10-CM | POA: Diagnosis not present

## 2020-06-07 DIAGNOSIS — R3914 Feeling of incomplete bladder emptying: Secondary | ICD-10-CM | POA: Diagnosis not present

## 2020-06-07 NOTE — Therapy (Signed)
[facility] [address] [place], [place], [address] Phone: [phone]   Fax:  [phone]  Physical Therapy Treatment  Patient Details  Name: [name] MRN: [id number] Date of Birth: February 07, 1927 Referring Provider (PT): [name], MD   Encounter Date: 06/07/2020   PT End of Session - 06/07/20 1344    Visit Number 5    Number of Visits 25    Date for PT Re-Evaluation 08/17/20    Authorization Type U[employer] Medicare    Progress Note Due on Visit 10    PT Start Time 1345    PT Stop Time 1429    PT Time Calculation (min) 44 min    Equipment Utilized During Treatment Gait belt    Activity Tolerance Patient tolerated treatment well;Patient limited by fatigue    Behavior During Therapy [facility] for tasks assessed/performed           Past Medical History:  Diagnosis Date  . Arthritis   . Borderline diabetic   . CHF (congestive heart failure) ([place])   . Chronic kidney disease   . Diabetes mellitus without complication ([place])   . Dysrhythmia   . Glaucoma   . Hyperlipidemia   . Hypertension   . Hypothyroidism   . Myocardial infarction ([place])   . PVD (peripheral vascular disease) ([place])   . Sleep apnea    Cpap ordered but doesnt use  . Urinary retention due to benign prostatic hyperplasia 12/28/2018    Past Surgical History:  Procedure Laterality Date  . ABDOMINAL AORTOGRAM W/LOWER EXTREMITY N/A 07/27/2018   Procedure: ABDOMINAL AORTOGRAM W/LOWER EXTREMITY;  Surgeon: Lorretta Harp, MD;  Location: [place] [facility];  Service: Cardiovascular;  Laterality: N/A;  . AMPUTATION Right 08/28/2018   Procedure: RIGHT BELOW KNEE AMPUTATION;  Surgeon: [name], MD;  Location: [place];  Service: Orthopedics;  Laterality: Right;  . AMPUTATION Left 12/02/2018   Procedure: LEFT TRANSMETATARSAL AMPUTATION AND NEGATIVE PRESSURE WOUND VAC PLACEMENT;  Surgeon: [name], MD;  Location: [place];  Service: Orthopedics;  Laterality: Left;  . AMPUTATION  Left 12/18/2018   Procedure: LEFT BELOW KNEE AMPUTATION;  Surgeon: [name], MD;  Location: [place];  Service: Orthopedics;  Laterality: Left;  . APPENDECTOMY  1943  . BELOW KNEE LEG AMPUTATION Left 12/18/2018  . CATARACT EXTRACTION  2012   x2  . COLONOSCOPY N/A 11/01/2014   Procedure: COLONOSCOPY;  Surgeon: Aviva Signs, MD;  [facility];  Service: Gastroenterology;  Laterality: N/A;  . ESOPHAGOGASTRODUODENOSCOPY N/A 11/01/2014   Procedure: ESOPHAGOGASTRODUODENOSCOPY (EGD);  Surgeon: Aviva Signs, MD;  [facility];  Service: Gastroenterology;  Laterality: N/A;  . HERNIA REPAIR Left   . INGUINAL HERNIA REPAIR Right 03/01/2013   Procedure: RIGHT INGUINAL HERNIORRHAPHY;  Surgeon: Jamesetta So, MD;  Location: [facility];  Service: General;  Laterality: Right;  . INSERTION OF MESH Right 03/01/2013   Procedure: INSERTION OF MESH;  Surgeon: Jamesetta So, MD;  Location: [facility];  Service: General;  Laterality: Right;  . LOWER EXTREMITY ANGIOGRAPHY Right 08/25/2018   Procedure: LOWER EXTREMITY ANGIOGRAPHY;  Surgeon: Wellington Hampshire, MD;  Location: [place] [facility];  Service: Cardiovascular;  Laterality: Right;  . LOWER EXTREMITY ANGIOGRAPHY N/A 11/25/2018   Procedure: LOWER EXTREMITY ANGIOGRAPHY;  Surgeon: Wellington Hampshire, MD;  Location: [place] [facility];  Service: Cardiovascular;  Laterality: N/A;  . NM MYOCAR PERF WALL MOTION  06/05/2010   no significant ischemia  . PERIPHERAL VASCULAR  ATHERECTOMY Right 08/03/2018   Procedure: PERIPHERAL VASCULAR ATHERECTOMY;  Surgeon: Lorretta Harp, MD;  Location: [place] [facility];  Service: Cardiovascular;  Laterality: Right;  . PERIPHERAL VASCULAR ATHERECTOMY Left 11/23/2018   Procedure: PERIPHERAL VASCULAR ATHERECTOMY;  Surgeon: Lorretta Harp, MD;  Location: [place] [facility];  Service: Cardiovascular;  Laterality: Left;  sfa with dc balloon  . PERIPHERAL VASCULAR BALLOON ANGIOPLASTY Left 11/25/2018   Procedure:  PERIPHERAL VASCULAR BALLOON ANGIOPLASTY;  Surgeon: Wellington Hampshire, MD;  Location: [place] [facility];  Service: Cardiovascular;  Laterality: Left;  popliteal  . PERIPHERAL VASCULAR INTERVENTION Left 11/25/2018   Procedure: PERIPHERAL VASCULAR INTERVENTION;  Surgeon: Wellington Hampshire, MD;  Location: [place] [facility];  Service: Cardiovascular;  Laterality: Left;  tibial peroneal trunk and peroneal stents   . stent  06/14/2010   left leg  . STUMP REVISION Left 01/20/2019   Procedure: REVISION LEFT BELOW KNEE AMPUTATION;  Surgeon: [name], MD;  Location: [place];  Service: Orthopedics;  Laterality: Left;  . US ECHOCARDIOGRAPHY  01/21/2006   moderate mitral annular ca+, mild MR, AOV moderately sclerotic.    There were no vitals filed for this visit.   Subjective Assessment - 06/07/20 1344    Subjective He was tired after last PT session.    Patient is accompained by: Family member    Pertinent History CAD, NSTEMI, bil TTA, TIA, HTN, DM, CKD stage IV, A-Fib, CHF, aortic stenosis    Limitations Standing;Walking;House hold activities    Patient Stated Goals To be able to walk safely with family in home.    Currently in Pain? No/denies                             [facility] Adult PT Treatment/Exercise - 06/07/20 1345      Transfers   Transfers Sit to Stand;Stand to [employer];Lateral/Scoot Transfers    Sit to Stand 3: Mod assist;2: Max assist;From elevated surface;With upper extremity assist;With armrests;From chair/3-in-1;Other (comment)   modA to //bars & maxA to RW   Sit to Stand Details Tactile cues for weight shifting;Verbal cues for technique    Stand to Sit 3: Mod assist;With upper extremity assist;With armrests;To elevated surface;To chair/3-in-1;Other (comment)   from RW or //bars   Stand to Sit Details (indicate cue type and reason) Tactile cues for weight shifting;Verbal cues for technique    Stand Pivot Transfers With armrests;2: Max assist   with  RW, 2 person assist.   Lateral/Scoot Transfers 2: Max assist;3: Mod assist;With armrests removed   scooting forward & back in w/c 1 person assist modA and w/c to Nustep 2 person assist maxA     Ambulation/Gait   Ambulation/Gait Yes    Ambulation/Gait Assistance 3: Mod assist   2 person   Ambulation/Gait Assistance Details manual assist for wt shift to stance limb, verbal & tactile/manual for upright posture.    Ambulation Distance (Feet) 20 Feet   8' in //bars & 20' with RW   Assistive device Rolling walker;Prostheses;Parallel bars      Neuro Re-ed    Neuro Re-ed Details  standing in //bars for 1 min X 2 with minA once wt positioned over feet.      Exercises   Exercises Knee/Hip      Knee/Hip Exercises: Aerobic   Recumbent Bike level 5 with BUEs & BLEs for 8 min Seat 10   PT assisted for full ROM  Prosthetics   Prosthetic Care Comments  PT redonned both prostheses upon arrival as liners had slid down ~1" which decreases control.    Current prosthetic wear tolerance (days/week)  daily    Current prosthetic wear tolerance (#hours/day)  son reports most of awake hours    Current prosthetic weight-bearing tolerance (hours/day)  2 minutes standing without pain in limbs.     Edema non-pitting    Residual limb condition  deep pink to skin especially proximal to knee, no open areas but frail appearing skin,  no hair growth, min to no hair growth, cylinderical shape                    PT Short Term Goals - 05/23/20 1757      PT SHORT TERM GOAL #1   Title Patient sit to/from stand w/c to RW and weight shifts to stable balanced position with minA    Time 1    Period Months    Status New    Target Date 06/23/20      PT SHORT TERM GOAL #2   Title Patient able to stand 2 min with RW support static with contact assist.    Time 1    Period Months    Status New    Target Date 06/23/20      PT SHORT TERM GOAL #3   Title Patient ambulates 29' with RW & prosthesis with modA  including 90* turn to position to sit.    Time 1    Period Months    Status New    Target Date 06/23/20             PT Long Term Goals - 05/23/20 1754      PT LONG TERM GOAL #1   Title Patient and family verbalize and demonstrate understanding of proper prosthetic care to enable safe utilization of prostheses.    Time 3    Period Months    Status New    Target Date 08/17/20      PT LONG TERM GOAL #2   Title Patient tolerates wear of bilateral prostheses >90% of awake hours without skin issues or limb pain to enable functional potential during his day.    Time 3    Period Months    Status New    Target Date 08/17/20      PT LONG TERM GOAL #3   Title Sit to / from stand elevated w/c to walker with prostheses with minA    Time 3    Period Months    Status New    Target Date 08/17/20      PT LONG TERM GOAL #4   Title Standing balance with walker support with bilateral prostheses static stance for 2 minutes, scanning environment and reaches 2" anteriorly with supervision.    Time 3    Period Months    Status New    Target Date 08/17/20      PT LONG TERM GOAL #5   Title Patient ambulates 21' between 2 chairs including turning 90* to position to sit with RW & prostheses with minA from family safely.    Time 3    Period Months    Status New    Target Date 08/17/20                 Plan - 06/07/20 1344    Clinical Impression Statement Patient had improved reaction to PT & activities today compared to yesterday but  still did not tolerate as much as last week.  PT started session in //bars to work on sit/stand, stance with wt over feet & initial gait.    Personal Factors and Comorbidities Age;Comorbidity 3+;Time since onset of injury/illness/exacerbation;Past/Current Experience;Fitness    Comorbidities bil TTAs, CAD, NSTEMI, bil TTA, TIA, HTN, DM, CKD stage IV, A-Fib, CHF, aortic stenosis    Examination-Activity Limitations Bed Mobility;Locomotion  Level;Stand;Transfers    Stability/Clinical Decision Making Evolving/Moderate complexity    Rehab Potential Good    PT Frequency 2x / week    PT Duration 12 weeks    PT Treatment/Interventions ADLs/Self Care Home Management;DME Instruction;Gait training;Stair training;Functional mobility training;Therapeutic activities;Therapeutic exercise;Balance training;Neuromuscular re-education;Patient/family education;Prosthetic Training;Passive range of motion    PT Next Visit Plan transfer training including stand pivot with RW, balance & gait activities, therapeutic exercise to increase ROM, strength & muscle endurance.    Consulted and Agree with Plan of Care Patient;Family member/caregiver    Family Member Consulted son, Delron Comer           Patient will benefit from skilled therapeutic intervention in order to improve the following deficits and impairments:  Abnormal gait,Decreased activity tolerance,Decreased balance,Decreased endurance,Decreased knowledge of use of DME,Decreased mobility,Decreased range of motion,Decreased skin integrity,Decreased strength,Impaired sensation,Postural dysfunction,Prosthetic Dependency  Visit Diagnosis: Other abnormalities of gait and mobility  Unsteadiness on feet  Abnormal posture  Stiffness of right knee, not elsewhere classified  Stiffness of left knee, not elsewhere classified  Contracture of muscle, multiple sites  Muscle weakness (generalized)  Localized edema     Problem List Patient Active Problem List   Diagnosis Date Noted  . Uncontrolled type 2 diabetes mellitus with hyperglycemia, without long-term current use of insulin ([place]) 02/25/2020  . CKD (chronic kidney disease) stage 4, GFR 15-29 ml/min (H[facility]) 02/25/2020  . Acute UTI   . AMS (altered mental status) 02/24/2020  . PNA (pneumonia) 12/06/2019  . Sepsis due to PNA 12/06/2019  . Acute metabolic encephalopathy 66/44/0347  . AKI (acute kidney injury) ([place])   . Aortic valve  stenosis   . Palliative care encounter   . NSTEMI (non-ST elevated myocardial infarction) ([place]) 10/04/2019  . Acute kidney injury superimposed on CKD ([place]) 10/04/2019  . Hypokalemia 10/04/2019  . Weakness generalized 04/05/2019  . S/P BKA (below knee amputation) unilateral, left ([place]) 02/15/2019  . Pressure injury of skin 02/03/2019  . TIA (transient ischemic attack) 02/03/2019  . Transient speech disturbance 02/02/2019  . Chronic indwelling Foley catheter 01/22/2019  . Postoperative wound infection 01/20/2019  . Normocytic anemia 01/18/2019  . Cellulitis 01/18/2019  . Infection of deep incisional surgical site after procedure   . Abnormal urinalysis   . Labile blood glucose   . Urinary retention   . S/P bilateral BKA (below knee amputation) ([place])   . Acute blood loss anemia   . Urinary retention due to benign prostatic hyperplasia 12/28/2018  . Unilateral complete BKA, left, initial encounter ([place]) 12/25/2018  . Dehiscence of amputation stump ([place])   . History of transmetatarsal amputation of left foot ([place]) 12/09/2018  . Critical limb ischemia with history of revascularization of same extremity ([place])   . Gangrene of left foot ([place])   . Diabetic ulcer of toe of left foot associated with type 2 diabetes mellitus ([place])   . Cellulitis of left lower extremity   . Toe infection 11/18/2018  . Peripheral edema   . Acute on chronic diastolic CHF (congestive heart failure) ([place]) 11/09/2018  . S/P BKA (below knee amputation) unilateral,  right ([place]) 10/05/2018  . Hyponatremia   . Essential hypertension   . Postoperative pain   . Pain of left heel   . Unable to maintain weight-bearing   . Type 2 diabetes mellitus with diabetic peripheral angiopathy with gangrene ([place])   . Anemia due to acute blood loss   . Chronic kidney disease, stage 3a   . Acquired absence of right leg below knee ([place]) 09/07/2018  . Gangrene of right foot ([place])   . Goals of care, counseling/discussion   .  Palliative care by specialist   . Type 2 diabetes mellitus with vascular disease ([place])   . Severe protein-calorie malnutrition ([place])   . Ischemic foot 08/20/2018  . Critical lower limb ischemia ([place]) 08/03/2018  . Type 2 diabetes mellitus with foot ulcer, without long-term current use of insulin ([place])   . Gastroesophageal reflux disease   . Cellulitis of great toe of right foot 05/29/2018  . Atrial fibrillation, chronic   . Chest pain 07/25/2017  . PAF (paroxysmal atrial fibrillation) ([place]) 07/25/2017  . BPH (benign prostatic hyperplasia) 07/25/2017  . Shortness of breath 05/11/2017  . Hypothyroidism 05/11/2017  . Chronic diastolic CHF (congestive heart failure) ([place]) 05/11/2017  . Moderate aortic stenosis 06/12/2016  . RBBB 06/12/2016  . Peripheral vascular disease ([place]) 08/04/2012  . Essential hypertension, benign 08/04/2012  . Hyperlipidemia 08/04/2012  . Dizziness 08/04/2012    Jamey Reas, PT, DPT  06/07/2020, 3:47 PM  [facility] [address] [place], [place], [address] Phone: [phone]   Fax:  [phone]  Name: [name] MRN: [id number] Date of Birth: Jan 24, 1927

## 2020-06-08 ENCOUNTER — Encounter: Payer: Medicare Other | Admitting: Physical Therapy

## 2020-06-13 DIAGNOSIS — E1122 Type 2 diabetes mellitus with diabetic chronic kidney disease: Secondary | ICD-10-CM | POA: Diagnosis not present

## 2020-06-13 DIAGNOSIS — I129 Hypertensive chronic kidney disease with stage 1 through stage 4 chronic kidney disease, or unspecified chronic kidney disease: Secondary | ICD-10-CM | POA: Diagnosis not present

## 2020-06-13 DIAGNOSIS — E7849 Other hyperlipidemia: Secondary | ICD-10-CM | POA: Diagnosis not present

## 2020-06-14 ENCOUNTER — Encounter: Payer: Self-pay | Admitting: Physical Therapy

## 2020-06-14 ENCOUNTER — Other Ambulatory Visit: Payer: Self-pay

## 2020-06-14 ENCOUNTER — Ambulatory Visit: Payer: Medicare Other | Admitting: Physical Therapy

## 2020-06-14 DIAGNOSIS — M25662 Stiffness of left knee, not elsewhere classified: Secondary | ICD-10-CM | POA: Diagnosis not present

## 2020-06-14 DIAGNOSIS — M6249 Contracture of muscle, multiple sites: Secondary | ICD-10-CM | POA: Diagnosis not present

## 2020-06-14 DIAGNOSIS — M6281 Muscle weakness (generalized): Secondary | ICD-10-CM

## 2020-06-14 DIAGNOSIS — M25661 Stiffness of right knee, not elsewhere classified: Secondary | ICD-10-CM

## 2020-06-14 DIAGNOSIS — R293 Abnormal posture: Secondary | ICD-10-CM

## 2020-06-14 DIAGNOSIS — R2681 Unsteadiness on feet: Secondary | ICD-10-CM | POA: Diagnosis not present

## 2020-06-14 DIAGNOSIS — R2689 Other abnormalities of gait and mobility: Secondary | ICD-10-CM | POA: Diagnosis not present

## 2020-06-14 NOTE — Therapy (Signed)
[facility] [address] [place], [place], [address] Phone: [phone]   Fax:  [phone]  Physical Therapy Treatment  Patient Details  Name: [name] MRN: [id number] Date of Birth: [birth year] Referring Provider (PT): [name], MD   Encounter Date: 06/14/2020   PT End of Session - 06/14/20 1057    Visit Number 6    Number of Visits 25    Date for PT Re-Evaluation 08/17/20    Authorization Type [employer] Medicare    Progress Note Due on Visit 10    PT Start Time 1100    PT Stop Time 1145    PT Time Calculation (min) 45 min    Equipment Utilized During Treatment Gait belt    Activity Tolerance Patient tolerated treatment well;Patient limited by fatigue    Behavior During Therapy [facility] for tasks assessed/performed           Past Medical History:  Diagnosis Date  . Arthritis   . Borderline diabetic   . CHF (congestive heart failure) ([place])   . Chronic kidney disease   . Diabetes mellitus without complication ([place])   . Dysrhythmia   . Glaucoma   . Hyperlipidemia   . Hypertension   . Hypothyroidism   . Myocardial infarction ([place])   . PVD (peripheral vascular disease) ([place])   . Sleep apnea    Cpap ordered but doesnt use  . Urinary retention due to benign prostatic hyperplasia 12/28/2018    Past Surgical History:  Procedure Laterality Date  . ABDOMINAL AORTOGRAM W/LOWER EXTREMITY N/A 07/27/2018   Procedure: ABDOMINAL AORTOGRAM W/LOWER EXTREMITY;  Surgeon: Lorretta Harp, MD;  Location: [place] [facility];  Service: Cardiovascular;  Laterality: N/A;  . AMPUTATION Right 08/28/2018   Procedure: RIGHT BELOW KNEE AMPUTATION;  Surgeon: [name], MD;  Location: [place];  Service: Orthopedics;  Laterality: Right;  . AMPUTATION Left 12/02/2018   Procedure: LEFT TRANSMETATARSAL AMPUTATION AND NEGATIVE PRESSURE WOUND VAC PLACEMENT;  Surgeon: [name], MD;  Location: [place];  Service: Orthopedics;  Laterality: Left;  . AMPUTATION  Left 12/18/2018   Procedure: LEFT BELOW KNEE AMPUTATION;  Surgeon: [name], MD;  Location: [place];  Service: Orthopedics;  Laterality: Left;  . APPENDECTOMY  1943  . BELOW KNEE LEG AMPUTATION Left 12/18/2018  . CATARACT EXTRACTION  2012   x2  . COLONOSCOPY N/A 11/01/2014   Procedure: COLONOSCOPY;  Surgeon: Aviva Signs, MD;  [facility];  Service: Gastroenterology;  Laterality: N/A;  . ESOPHAGOGASTRODUODENOSCOPY N/A 11/01/2014   Procedure: ESOPHAGOGASTRODUODENOSCOPY (EGD);  Surgeon: Aviva Signs, MD;  [facility];  Service: Gastroenterology;  Laterality: N/A;  . HERNIA REPAIR Left   . INGUINAL HERNIA REPAIR Right 03/01/2013   Procedure: RIGHT INGUINAL HERNIORRHAPHY;  Surgeon: Jamesetta So, MD;  Location: [facility];  Service: General;  Laterality: Right;  . INSERTION OF MESH Right 03/01/2013   Procedure: INSERTION OF MESH;  Surgeon: Jamesetta So, MD;  Location: [facility];  Service: General;  Laterality: Right;  . LOWER EXTREMITY ANGIOGRAPHY Right 08/25/2018   Procedure: LOWER EXTREMITY ANGIOGRAPHY;  Surgeon: Wellington Hampshire, MD;  Location: [place] [facility];  Service: Cardiovascular;  Laterality: Right;  . LOWER EXTREMITY ANGIOGRAPHY N/A 11/25/2018   Procedure: LOWER EXTREMITY ANGIOGRAPHY;  Surgeon: Wellington Hampshire, MD;  Location: [place] [facility];  Service: Cardiovascular;  Laterality: N/A;  . NM MYOCAR PERF WALL MOTION  06/05/2010   no significant ischemia  . PERIPHERAL VASCULAR  ATHERECTOMY Right 08/03/2018   Procedure: PERIPHERAL VASCULAR ATHERECTOMY;  Surgeon: Lorretta Harp, MD;  Location: [place] [facility];  Service: Cardiovascular;  Laterality: Right;  . PERIPHERAL VASCULAR ATHERECTOMY Left 11/23/2018   Procedure: PERIPHERAL VASCULAR ATHERECTOMY;  Surgeon: Lorretta Harp, MD;  Location: [place] [facility];  Service: Cardiovascular;  Laterality: Left;  sfa with dc balloon  . PERIPHERAL VASCULAR BALLOON ANGIOPLASTY Left 11/25/2018   Procedure:  PERIPHERAL VASCULAR BALLOON ANGIOPLASTY;  Surgeon: Wellington Hampshire, MD;  Location: [place] [facility];  Service: Cardiovascular;  Laterality: Left;  popliteal  . PERIPHERAL VASCULAR INTERVENTION Left 11/25/2018   Procedure: PERIPHERAL VASCULAR INTERVENTION;  Surgeon: Wellington Hampshire, MD;  Location: [place] [facility];  Service: Cardiovascular;  Laterality: Left;  tibial peroneal trunk and peroneal stents   . stent  06/14/2010   left leg  . STUMP REVISION Left 01/20/2019   Procedure: REVISION LEFT BELOW KNEE AMPUTATION;  Surgeon: [name], MD;  Location: [place];  Service: Orthopedics;  Laterality: Left;  . US ECHOCARDIOGRAPHY  01/21/2006   moderate mitral annular ca+, mild MR, AOV moderately sclerotic.    There were no vitals filed for this visit.   Subjective Assessment - 06/14/20 1057    Subjective His blood glucose monitor was giving different readings than son's monitor.  They think that it may be how storing test strips.  His back itches and is sore to touch.  (PT recommended video or in person visit with PCP)    Patient is accompained by: Family member    Pertinent History CAD, NSTEMI, bil TTA, TIA, HTN, DM, CKD stage IV, A-Fib, CHF, aortic stenosis    Limitations Standing;Walking;House hold activities    Patient Stated Goals To be able to walk safely with family in home.    Currently in Pain? No/denies                             [facility] Adult PT Treatment/Exercise - 06/14/20 1100      Transfers   Transfers Sit to Stand;Stand to [employer];Lateral/Scoot Transfers    Sit to Stand 2: Max assist;From elevated surface;With upper extremity assist;With armrests;From chair/3-in-1;Other (comment)   maxA to //bars   Sit to Stand Details Tactile cues for weight shifting;Verbal cues for technique    Stand to Sit 3: Mod assist;With upper extremity assist;With armrests;To elevated surface;To chair/3-in-1;Other (comment)   from //bars   Stand to Sit Details  (indicate cue type and reason) Tactile cues for weight shifting;Verbal cues for technique    Stand Pivot Transfers 2: Max assist   in //bars pivoting feet right & left   Stand Pivot Transfer Details (indicate cue type and reason) PT demo, manual & verbal cues on pivoting feet in standing    Lateral/Scoot Transfers 3: Mod assist   scooting forward & back in w/c 1 person assist modA     Ambulation/Gait   Ambulation/Gait Yes    Ambulation/Gait Assistance 2: Max assist   in //bars   Ambulation/Gait Assistance Details manual assist for wt shift to stance limb, verbal & tactile/manual for upright posture.    Ambulation Distance (Feet) 5 Feet   5' in //bars   Assistive device Prostheses;Parallel bars      Neuro Re-ed    Neuro Re-ed Details  standing in //bars for 1 min X 2 with minA once wt positioned over feet.  Reaching single UE to PTs shoulder with  modA.      Exercises   Exercises Knee/Hip      Knee/Hip Exercises: Aerobic   Recumbent Bike --      Knee/Hip Exercises: Seated   Other Seated Knee/Hip Exercises Hip rotation internal & external seated in reclined position AAROM 5 reps ea IR & ER on ea LE.      Prosthetics   Prosthetic Care Comments  --    Current prosthetic wear tolerance (days/week)  daily    Current prosthetic wear tolerance (#hours/day)  son reports most of awake hours    Current prosthetic weight-bearing tolerance (hours/day)  2 minutes standing without pain in limbs.     Edema non-pitting    Residual limb condition  deep pink to skin especially proximal to knee, no open areas but frail appearing skin,  no hair growth, min to no hair growth, cylinderical shape                    PT Short Term Goals - 05/23/20 1757      PT SHORT TERM GOAL #1   Title Patient sit to/from stand w/c to RW and weight shifts to stable balanced position with minA    Time 1    Period Months    Status New    Target Date 06/23/20      PT SHORT TERM GOAL #2   Title Patient able  to stand 2 min with RW support static with contact assist.    Time 1    Period Months    Status New    Target Date 06/23/20      PT SHORT TERM GOAL #3   Title Patient ambulates 9' with RW & prosthesis with modA including 90* turn to position to sit.    Time 1    Period Months    Status New    Target Date 06/23/20             PT Long Term Goals - 05/23/20 1754      PT LONG TERM GOAL #1   Title Patient and family verbalize and demonstrate understanding of proper prosthetic care to enable safe utilization of prostheses.    Time 3    Period Months    Status New    Target Date 08/17/20      PT LONG TERM GOAL #2   Title Patient tolerates wear of bilateral prostheses >90% of awake hours without skin issues or limb pain to enable functional potential during his day.    Time 3    Period Months    Status New    Target Date 08/17/20      PT LONG TERM GOAL #3   Title Sit to / from stand elevated w/c to walker with prostheses with minA    Time 3    Period Months    Status New    Target Date 08/17/20      PT LONG TERM GOAL #4   Title Standing balance with walker support with bilateral prostheses static stance for 2 minutes, scanning environment and reaches 2" anteriorly with supervision.    Time 3    Period Months    Status New    Target Date 08/17/20      PT LONG TERM GOAL #5   Title Patient ambulates 15' between 2 chairs including turning 90* to position to sit with RW & prostheses with minA from family safely.    Time 3    Period Months  Status New    Target Date 08/17/20                 Plan - 06/14/20 1057    Clinical Impression Statement Patient's rash may be shingles and PT recommended consult with his PCP.  He had shingles vaccine but doesn't know how long ago or if got 2nd dose.  His son & he verbalize understanding of recommendation for consult of rash.  PT session was limited by pain with rash on lower back at waist line of pants.  PT introduced seated  hip rotation to help with stand pivot transfers.    Personal Factors and Comorbidities Age;Comorbidity 3+;Time since onset of injury/illness/exacerbation;Past/Current Experience;Fitness    Comorbidities bil TTAs, CAD, NSTEMI, bil TTA, TIA, HTN, DM, CKD stage IV, A-Fib, CHF, aortic stenosis    Examination-Activity Limitations Bed Mobility;Locomotion Level;Stand;Transfers    Stability/Clinical Decision Making Evolving/Moderate complexity    Rehab Potential Good    PT Frequency 2x / week    PT Duration 12 weeks    PT Treatment/Interventions ADLs/Self Care Home Management;DME Instruction;Gait training;Stair training;Functional mobility training;Therapeutic activities;Therapeutic exercise;Balance training;Neuromuscular re-education;Patient/family education;Prosthetic Training;Passive range of motion    PT Next Visit Plan transfer training including stand pivot with RW, balance & gait activities, therapeutic exercise to increase ROM, strength & muscle endurance.    Consulted and Agree with Plan of Care Patient;Family member/caregiver    Family Member Consulted son, Jalynn Betzold           Patient will benefit from skilled therapeutic intervention in order to improve the following deficits and impairments:  Abnormal gait,Decreased activity tolerance,Decreased balance,Decreased endurance,Decreased knowledge of use of DME,Decreased mobility,Decreased range of motion,Decreased skin integrity,Decreased strength,Impaired sensation,Postural dysfunction,Prosthetic Dependency  Visit Diagnosis: Unsteadiness on feet  Other abnormalities of gait and mobility  Abnormal posture  Stiffness of right knee, not elsewhere classified  Contracture of muscle, multiple sites  Stiffness of left knee, not elsewhere classified  Muscle weakness (generalized)     Problem List Patient Active Problem List   Diagnosis Date Noted  . Uncontrolled type 2 diabetes mellitus with hyperglycemia, without long-term current  use of insulin ([place]) 02/25/2020  . CKD (chronic kidney disease) stage 4, GFR 15-29 ml/min ([facility]) 02/25/2020  . Acute UTI   . AMS (altered mental status) 02/24/2020  . PNA (pneumonia) 12/06/2019  . Sepsis due to PNA 12/06/2019  . Acute metabolic encephalopathy 65/03/5463  . AKI (acute kidney injury) ([place])   . Aortic valve stenosis   . Palliative care encounter   . NSTEMI (non-ST elevated myocardial infarction) ([place]) 10/04/2019  . Acute kidney injury superimposed on CKD ([place]) 10/04/2019  . Hypokalemia 10/04/2019  . Weakness generalized 04/05/2019  . S/P BKA (below knee amputation) unilateral, left ([place]) 02/15/2019  . Pressure injury of skin 02/03/2019  . TIA (transient ischemic attack) 02/03/2019  . Transient speech disturbance 02/02/2019  . Chronic indwelling Foley catheter 01/22/2019  . Postoperative wound infection 01/20/2019  . Normocytic anemia 01/18/2019  . Cellulitis 01/18/2019  . Infection of deep incisional surgical site after procedure   . Abnormal urinalysis   . Labile blood glucose   . Urinary retention   . S/P bilateral BKA (below knee amputation) ([place])   . Acute blood loss anemia   . Urinary retention due to benign prostatic hyperplasia 12/28/2018  . Unilateral complete BKA, left, initial encounter ([place]) 12/25/2018  . Dehiscence of amputation stump ([place])   . History of transmetatarsal amputation of left foot ([place]) 12/09/2018  . Critical  limb ischemia with history of revascularization of same extremity ([place])   . Gangrene of left foot ([place])   . Diabetic ulcer of toe of left foot associated with type 2 diabetes mellitus ([place])   . Cellulitis of left lower extremity   . Toe infection 11/18/2018  . Peripheral edema   . Acute on chronic diastolic CHF (congestive heart failure) ([place]) 11/09/2018  . S/P BKA (below knee amputation) unilateral, right ([place]) 10/05/2018  . Hyponatremia   . Essential hypertension   . Postoperative pain   . Pain of left heel   . Unable to  maintain weight-bearing   . Type 2 diabetes mellitus with diabetic peripheral angiopathy with gangrene ([place])   . Anemia due to acute blood loss   . Chronic kidney disease, stage 3a   . Acquired absence of right leg below knee ([place]) 09/07/2018  . Gangrene of right foot ([place])   . Goals of care, counseling/discussion   . Palliative care by specialist   . Type 2 diabetes mellitus with vascular disease ([place])   . Severe protein-calorie malnutrition ([place])   . Ischemic foot 08/20/2018  . Critical lower limb ischemia ([place]) 08/03/2018  . Type 2 diabetes mellitus with foot ulcer, without long-term current use of insulin ([place])   . Gastroesophageal reflux disease   . Cellulitis of great toe of right foot 05/29/2018  . Atrial fibrillation, chronic   . Chest pain 07/25/2017  . PAF (paroxysmal atrial fibrillation) ([place]) 07/25/2017  . BPH (benign prostatic hyperplasia) 07/25/2017  . Shortness of breath 05/11/2017  . Hypothyroidism 05/11/2017  . Chronic diastolic CHF (congestive heart failure) ([place]) 05/11/2017  . Moderate aortic stenosis 06/12/2016  . RBBB 06/12/2016  . Peripheral vascular disease ([place]) 08/04/2012  . Essential hypertension, benign 08/04/2012  . Hyperlipidemia 08/04/2012  . Dizziness 08/04/2012    Jamey Reas, PT, DPT 06/14/2020, 12:04 PM  [facility] [address] [place], [place], [address] Phone: [phone]   Fax:  [phone]  Name: [name] MRN: [id number] Date of Birth: [birth year]

## 2020-06-15 DIAGNOSIS — I5032 Chronic diastolic (congestive) heart failure: Secondary | ICD-10-CM | POA: Diagnosis not present

## 2020-06-15 DIAGNOSIS — N189 Chronic kidney disease, unspecified: Secondary | ICD-10-CM | POA: Diagnosis not present

## 2020-06-15 DIAGNOSIS — E1122 Type 2 diabetes mellitus with diabetic chronic kidney disease: Secondary | ICD-10-CM | POA: Diagnosis not present

## 2020-06-15 DIAGNOSIS — R809 Proteinuria, unspecified: Secondary | ICD-10-CM | POA: Diagnosis not present

## 2020-06-15 DIAGNOSIS — I129 Hypertensive chronic kidney disease with stage 1 through stage 4 chronic kidney disease, or unspecified chronic kidney disease: Secondary | ICD-10-CM | POA: Diagnosis not present

## 2020-06-15 DIAGNOSIS — E1129 Type 2 diabetes mellitus with other diabetic kidney complication: Secondary | ICD-10-CM | POA: Diagnosis not present

## 2020-06-15 DIAGNOSIS — D638 Anemia in other chronic diseases classified elsewhere: Secondary | ICD-10-CM | POA: Diagnosis not present

## 2020-06-17 ENCOUNTER — Other Ambulatory Visit: Payer: Self-pay

## 2020-06-17 ENCOUNTER — Inpatient Hospital Stay (HOSPITAL_COMMUNITY)
Admission: EM | Admit: 2020-06-17 | Discharge: 2020-06-19 | DRG: 291 | Disposition: A | Discharge status: Home Health | Payer: Medicare Other | Attending: Cardiovascular Disease | Admitting: Cardiovascular Disease

## 2020-06-17 ENCOUNTER — Emergency Department (HOSPITAL_COMMUNITY): Payer: Medicare Other

## 2020-06-17 ENCOUNTER — Encounter (HOSPITAL_COMMUNITY): Payer: Self-pay

## 2020-06-17 DIAGNOSIS — Z89512 Acquired absence of left leg below knee: Secondary | ICD-10-CM | POA: Diagnosis not present

## 2020-06-17 DIAGNOSIS — R079 Chest pain, unspecified: Secondary | ICD-10-CM | POA: Diagnosis not present

## 2020-06-17 DIAGNOSIS — I350 Nonrheumatic aortic (valve) stenosis: Secondary | ICD-10-CM | POA: Diagnosis not present

## 2020-06-17 DIAGNOSIS — Z7984 Long term (current) use of oral hypoglycemic drugs: Secondary | ICD-10-CM | POA: Diagnosis not present

## 2020-06-17 DIAGNOSIS — I2510 Atherosclerotic heart disease of native coronary artery without angina pectoris: Secondary | ICD-10-CM | POA: Diagnosis not present

## 2020-06-17 DIAGNOSIS — J90 Pleural effusion, not elsewhere classified: Secondary | ICD-10-CM | POA: Diagnosis not present

## 2020-06-17 DIAGNOSIS — Z79890 Hormone replacement therapy (postmenopausal): Secondary | ICD-10-CM | POA: Diagnosis not present

## 2020-06-17 DIAGNOSIS — I130 Hypertensive heart and chronic kidney disease with heart failure and stage 1 through stage 4 chronic kidney disease, or unspecified chronic kidney disease: Secondary | ICD-10-CM | POA: Diagnosis not present

## 2020-06-17 DIAGNOSIS — E785 Hyperlipidemia, unspecified: Secondary | ICD-10-CM | POA: Diagnosis present

## 2020-06-17 DIAGNOSIS — Z79899 Other long term (current) drug therapy: Secondary | ICD-10-CM | POA: Diagnosis not present

## 2020-06-17 DIAGNOSIS — Z87891 Personal history of nicotine dependence: Secondary | ICD-10-CM

## 2020-06-17 DIAGNOSIS — I10 Essential (primary) hypertension: Secondary | ICD-10-CM | POA: Diagnosis present

## 2020-06-17 DIAGNOSIS — I5033 Acute on chronic diastolic (congestive) heart failure: Secondary | ICD-10-CM | POA: Diagnosis not present

## 2020-06-17 DIAGNOSIS — Z7401 Bed confinement status: Secondary | ICD-10-CM

## 2020-06-17 DIAGNOSIS — I440 Atrioventricular block, first degree: Secondary | ICD-10-CM | POA: Diagnosis not present

## 2020-06-17 DIAGNOSIS — Z8249 Family history of ischemic heart disease and other diseases of the circulatory system: Secondary | ICD-10-CM

## 2020-06-17 DIAGNOSIS — E1122 Type 2 diabetes mellitus with diabetic chronic kidney disease: Secondary | ICD-10-CM | POA: Diagnosis not present

## 2020-06-17 DIAGNOSIS — Z20822 Contact with and (suspected) exposure to covid-19: Secondary | ICD-10-CM | POA: Diagnosis not present

## 2020-06-17 DIAGNOSIS — I739 Peripheral vascular disease, unspecified: Secondary | ICD-10-CM | POA: Diagnosis present

## 2020-06-17 DIAGNOSIS — I2489 Other forms of acute ischemic heart disease: Secondary | ICD-10-CM

## 2020-06-17 DIAGNOSIS — R069 Unspecified abnormalities of breathing: Secondary | ICD-10-CM | POA: Diagnosis not present

## 2020-06-17 DIAGNOSIS — Z7902 Long term (current) use of antithrombotics/antiplatelets: Secondary | ICD-10-CM | POA: Diagnosis not present

## 2020-06-17 DIAGNOSIS — I252 Old myocardial infarction: Secondary | ICD-10-CM | POA: Diagnosis not present

## 2020-06-17 DIAGNOSIS — I214 Non-ST elevation (NSTEMI) myocardial infarction: Secondary | ICD-10-CM | POA: Diagnosis not present

## 2020-06-17 DIAGNOSIS — R6889 Other general symptoms and signs: Secondary | ICD-10-CM | POA: Diagnosis not present

## 2020-06-17 DIAGNOSIS — E1159 Type 2 diabetes mellitus with other circulatory complications: Secondary | ICD-10-CM | POA: Diagnosis present

## 2020-06-17 DIAGNOSIS — I248 Other forms of acute ischemic heart disease: Secondary | ICD-10-CM | POA: Diagnosis not present

## 2020-06-17 DIAGNOSIS — Z743 Need for continuous supervision: Secondary | ICD-10-CM | POA: Diagnosis not present

## 2020-06-17 DIAGNOSIS — I480 Paroxysmal atrial fibrillation: Secondary | ICD-10-CM | POA: Diagnosis not present

## 2020-06-17 DIAGNOSIS — R0789 Other chest pain: Secondary | ICD-10-CM | POA: Diagnosis not present

## 2020-06-17 DIAGNOSIS — N184 Chronic kidney disease, stage 4 (severe): Secondary | ICD-10-CM | POA: Diagnosis present

## 2020-06-17 DIAGNOSIS — E1151 Type 2 diabetes mellitus with diabetic peripheral angiopathy without gangrene: Secondary | ICD-10-CM | POA: Diagnosis not present

## 2020-06-17 DIAGNOSIS — Z89511 Acquired absence of right leg below knee: Secondary | ICD-10-CM

## 2020-06-17 DIAGNOSIS — R0689 Other abnormalities of breathing: Secondary | ICD-10-CM | POA: Diagnosis not present

## 2020-06-17 DIAGNOSIS — I4510 Unspecified right bundle-branch block: Secondary | ICD-10-CM | POA: Diagnosis not present

## 2020-06-17 DIAGNOSIS — I509 Heart failure, unspecified: Secondary | ICD-10-CM | POA: Diagnosis not present

## 2020-06-17 DIAGNOSIS — Z7901 Long term (current) use of anticoagulants: Secondary | ICD-10-CM

## 2020-06-17 DIAGNOSIS — E039 Hypothyroidism, unspecified: Secondary | ICD-10-CM | POA: Diagnosis present

## 2020-06-17 DIAGNOSIS — R0602 Shortness of breath: Secondary | ICD-10-CM | POA: Diagnosis not present

## 2020-06-17 DIAGNOSIS — I351 Nonrheumatic aortic (valve) insufficiency: Secondary | ICD-10-CM | POA: Diagnosis not present

## 2020-06-17 LAB — CBC WITH DIFFERENTIAL/PLATELET
Abs Immature Granulocytes: 0.07 10*3/uL (ref 0.00–0.07)
Basophils Absolute: 0.1 10*3/uL (ref 0.0–0.1)
Basophils Relative: 0 %
Eosinophils Absolute: 0.4 10*3/uL (ref 0.0–0.5)
Eosinophils Relative: 3 %
HCT: 36.2 % — ABNORMAL LOW (ref 39.0–52.0)
Hemoglobin: 11.9 g/dL — ABNORMAL LOW (ref 13.0–17.0)
Immature Granulocytes: 1 %
Lymphocytes Relative: 13 %
Lymphs Abs: 1.7 10*3/uL (ref 0.7–4.0)
MCH: 32.3 pg (ref 26.0–34.0)
MCHC: 32.9 g/dL (ref 30.0–36.0)
MCV: 98.4 fL (ref 80.0–100.0)
Monocytes Absolute: 0.9 10*3/uL (ref 0.1–1.0)
Monocytes Relative: 7 %
Neutro Abs: 10 10*3/uL — ABNORMAL HIGH (ref 1.7–7.7)
Neutrophils Relative %: 76 %
Platelets: 203 10*3/uL (ref 150–400)
RBC: 3.68 MIL/uL — ABNORMAL LOW (ref 4.22–5.81)
RDW: 14 % (ref 11.5–15.5)
WBC: 13.2 10*3/uL — ABNORMAL HIGH (ref 4.0–10.5)
nRBC: 0 % (ref 0.0–0.2)

## 2020-06-17 LAB — COMPREHENSIVE METABOLIC PANEL
ALT: 18 U/L (ref 0–44)
AST: 22 U/L (ref 15–41)
Albumin: 3.5 g/dL (ref 3.5–5.0)
Alkaline Phosphatase: 55 U/L (ref 38–126)
Anion gap: 9 (ref 5–15)
BUN: 43 mg/dL — ABNORMAL HIGH (ref 8–23)
CO2: 26 mmol/L (ref 22–32)
Calcium: 8.8 mg/dL — ABNORMAL LOW (ref 8.9–10.3)
Chloride: 95 mmol/L — ABNORMAL LOW (ref 98–111)
Creatinine, Ser: 2.87 mg/dL — ABNORMAL HIGH (ref 0.61–1.24)
GFR, Estimated: 20 mL/min — ABNORMAL LOW (ref >60)
Glucose, Bld: 207 mg/dL — ABNORMAL HIGH (ref 70–99)
Potassium: 3.6 mmol/L (ref 3.5–5.1)
Sodium: 130 mmol/L — ABNORMAL LOW (ref 135–145)
Total Bilirubin: 0.8 mg/dL (ref 0.3–1.2)
Total Protein: 7.9 g/dL (ref 6.5–8.1)

## 2020-06-17 LAB — TROPONIN I (HIGH SENSITIVITY)
Troponin I (High Sensitivity): 50 ng/L — ABNORMAL HIGH (ref <18)
Troponin I (High Sensitivity): 519 ng/L (ref <18)

## 2020-06-17 LAB — BRAIN NATRIURETIC PEPTIDE: B Natriuretic Peptide: 412 pg/mL — ABNORMAL HIGH (ref 0.0–100.0)

## 2020-06-17 LAB — SARS CORONAVIRUS 2 (TAT 6-24 HRS): SARS Coronavirus 2: NEGATIVE

## 2020-06-17 MED ORDER — LEVOTHYROXINE SODIUM 75 MCG PO TABS
75.0000 ug | ORAL_TABLET | Freq: Every day | ORAL | Status: DC
  Administered 2020-06-18 – 2020-06-19 (×2): 75 ug via ORAL
  Filled 2020-06-17 (×2): qty 1

## 2020-06-17 MED ORDER — FUROSEMIDE 10 MG/ML IJ SOLN
40.0000 mg | Freq: Once | INTRAMUSCULAR | Status: AC
  Administered 2020-06-17: 40 mg via INTRAVENOUS
  Filled 2020-06-17: qty 4

## 2020-06-17 MED ORDER — HEPARIN (PORCINE) 25000 UT/250ML-% IV SOLN: 1000.0000 [IU]/h | INTRAVENOUS | Status: DC

## 2020-06-17 MED ORDER — FAMOTIDINE 20 MG PO TABS
20.0000 mg | ORAL_TABLET | Freq: Every day | ORAL | Status: DC
  Administered 2020-06-18 – 2020-06-19 (×2): 20 mg via ORAL
  Filled 2020-06-17 (×2): qty 1

## 2020-06-17 MED ORDER — NITROGLYCERIN 0.4 MG SL SUBL: 0.4000 mg | SUBLINGUAL_TABLET | SUBLINGUAL | Status: DC | PRN

## 2020-06-17 MED ORDER — NITROFURANTOIN MACROCRYSTAL 50 MG PO CAPS
100.0000 mg | ORAL_CAPSULE | Freq: Every day | ORAL | Status: DC
  Administered 2020-06-17: 100 mg via ORAL
  Filled 2020-06-17: qty 1
  Filled 2020-06-17: qty 2

## 2020-06-17 MED ORDER — TRIAMCINOLONE ACETONIDE 0.5 % EX CREA
TOPICAL_CREAM | Freq: Two times a day (BID) | CUTANEOUS | Status: DC
  Filled 2020-06-17: qty 15

## 2020-06-17 MED ORDER — CLOPIDOGREL BISULFATE 75 MG PO TABS
75.0000 mg | ORAL_TABLET | Freq: Every day | ORAL | Status: DC
  Administered 2020-06-18 – 2020-06-19 (×2): 75 mg via ORAL
  Filled 2020-06-17 (×2): qty 1

## 2020-06-17 MED ORDER — ONDANSETRON HCL 4 MG/2ML IJ SOLN: 4.0000 mg | Freq: Four times a day (QID) | INTRAMUSCULAR | Status: DC | PRN

## 2020-06-17 MED ORDER — APIXABAN 2.5 MG PO TABS
2.5000 mg | ORAL_TABLET | Freq: Two times a day (BID) | ORAL | Status: DC
  Administered 2020-06-17 – 2020-06-19 (×4): 2.5 mg via ORAL
  Filled 2020-06-17 (×4): qty 1

## 2020-06-17 MED ORDER — CHLORHEXIDINE GLUCONATE CLOTH 2 % EX PADS
6.0000 | MEDICATED_PAD | Freq: Every day | CUTANEOUS | Status: DC
  Administered 2020-06-17 – 2020-06-19 (×3): 6 via TOPICAL

## 2020-06-17 MED ORDER — HEPARIN (PORCINE) 25000 UT/250ML-% IV SOLN
1000.0000 [IU]/h | INTRAVENOUS | Status: DC
  Administered 2020-06-17: 1000 [IU]/h via INTRAVENOUS
  Filled 2020-06-17: qty 250

## 2020-06-17 MED ORDER — FERROUS SULFATE 325 (65 FE) MG PO TABS
325.0000 mg | ORAL_TABLET | Freq: Two times a day (BID) | ORAL | Status: DC
  Administered 2020-06-18 – 2020-06-19 (×3): 325 mg via ORAL
  Filled 2020-06-17 (×3): qty 1

## 2020-06-17 MED ORDER — CARVEDILOL 6.25 MG PO TABS
6.2500 mg | ORAL_TABLET | Freq: Two times a day (BID) | ORAL | Status: DC
  Administered 2020-06-17 – 2020-06-19 (×4): 6.25 mg via ORAL
  Filled 2020-06-17 (×4): qty 1

## 2020-06-17 MED ORDER — ACETAMINOPHEN 325 MG PO TABS: 650.0000 mg | ORAL_TABLET | ORAL | Status: DC | PRN

## 2020-06-17 MED ORDER — LATANOPROST 0.005 % OP SOLN
1.0000 [drp] | Freq: Every day | OPHTHALMIC | Status: DC
  Administered 2020-06-17 – 2020-06-18 (×2): 1 [drp] via OPHTHALMIC
  Filled 2020-06-17: qty 2.5

## 2020-06-17 MED ORDER — ATORVASTATIN CALCIUM 40 MG PO TABS
40.0000 mg | ORAL_TABLET | Freq: Every evening | ORAL | Status: DC
  Administered 2020-06-17 – 2020-06-18 (×2): 40 mg via ORAL
  Filled 2020-06-17 (×2): qty 1

## 2020-06-17 NOTE — H&P (Addendum)
Cardiology Admission History and Physical:   Patient ID: [name] MRN: [id number]; DOB: 10/12/1927   Admission date: 06/17/2020  PCP:  Redmond School, MD   [facility] Cardiologist:  Pixie Casino, MD        Chief Complaint: Chest Pain  Patient Profile:   [name] is a 87 y.o. male with PMH of chronic diastolic CHF, paroxysmal atrial fibrillation (on Eliquis), presumed CAD (s/p NSTEMI in 09/2019 and medical management pursued with cath not performed given CKD), HTN, HLD, Type 2 DM, PAD (s/p bilateral BKA's), Stage 3-4 CKD and aortic stenosis (moderate to severe by echo in 08/2019) who is being seen 06/17/2020 for the evaluation of chest pain.  History of Present Illness:   Mr. Heidelberger was last examined by Dr. Debara Pickett in 02/2020 and had recently been admitted for AMS in the setting of a UTI. Was overall doing well at the time of his visit and although it was not felt be was a candidate for TAVR during prior evaluation due to his medical co-morbidities and advanced CKD, a repeat echo was planned for later in the year.   He presented to [facility] ED during the early morning hours of 06/17/2020 for evaluation of chest pain. In talking with the patient and his daughter today, most history is provided by his daughter and she reports he lives at home but his 3 children rotate taking care of him. He is mostly bed-bound but sits in a wheelchair to go places. Previously participating in PT once a week. Over the past week, he has been sleeping more per their report and has not been consuming much food. They have been supplementing with protein shakes. Yesterday, he developed a pain along his right flank and this worsened throughout the night with associated dyspnea, therefore they came to the ED for further evaluation. No specific reports of orthopnea, PND or pitting edema. They have noted some abdominal distension. He has also experienced itching along his back which has improved with  topical steroids.   Initial labs show WBC 13.2, Hgb 11.9, platelets 203, Na+ 130, K+ 3.6 and creatinine 2.87 (baseline 2.5 - 2.6).  BNP 412. Initial Hs Troponin 50 with repeat of 519. CXR showed a small left pleural effusion. EKG shows NSR, HR 67 with known RBBB.   Given his elevated enzymes and no Cardiology service at [facility] on the weekend, he was transferred to [facility] for further management. At the time, he denies any chest pain. Breathing is back to baseline.    Past Medical History:  Diagnosis Date  . Arthritis   . Borderline diabetic   . CHF (congestive heart failure) ([place])   . Chronic kidney disease   . Diabetes mellitus without complication ([place])   . Dysrhythmia   . Glaucoma   . Hyperlipidemia   . Hypertension   . Hypothyroidism   . Myocardial infarction ([place])   . PVD (peripheral vascular disease) ([place])   . Sleep apnea    Cpap ordered but doesnt use  . Urinary retention due to benign prostatic hyperplasia 12/28/2018    Past Surgical History:  Procedure Laterality Date  . ABDOMINAL AORTOGRAM W/LOWER EXTREMITY N/A 07/27/2018   Procedure: ABDOMINAL AORTOGRAM W/LOWER EXTREMITY;  Surgeon: Lorretta Harp, MD;  Location: [place] [facility];  Service: Cardiovascular;  Laterality: N/A;  . AMPUTATION Right 08/28/2018   Procedure: RIGHT BELOW KNEE AMPUTATION;  Surgeon: [name], MD;  Location: [place];  Service: Orthopedics;  Laterality: Right;  . AMPUTATION Left 12/02/2018   Procedure: LEFT TRANSMETATARSAL AMPUTATION AND NEGATIVE PRESSURE WOUND VAC PLACEMENT;  Surgeon: [name], MD;  Location: [place];  Service: Orthopedics;  Laterality: Left;  . AMPUTATION Left 12/18/2018   Procedure: LEFT BELOW KNEE AMPUTATION;  Surgeon: [name], MD;  Location: [place];  Service: Orthopedics;  Laterality: Left;  . APPENDECTOMY  1943  . BELOW KNEE LEG AMPUTATION Left 12/18/2018  . CATARACT EXTRACTION  2012   x2  . COLONOSCOPY N/A 11/01/2014   Procedure: COLONOSCOPY;   Surgeon: Aviva Signs, MD;  [facility];  Service: Gastroenterology;  Laterality: N/A;  . ESOPHAGOGASTRODUODENOSCOPY N/A 11/01/2014   Procedure: ESOPHAGOGASTRODUODENOSCOPY (EGD);  Surgeon: Aviva Signs, MD;  [facility];  Service: Gastroenterology;  Laterality: N/A;  . HERNIA REPAIR Left   . INGUINAL HERNIA REPAIR Right 03/01/2013   Procedure: RIGHT INGUINAL HERNIORRHAPHY;  Surgeon: Jamesetta So, MD;  Location: [facility];  Service: General;  Laterality: Right;  . INSERTION OF MESH Right 03/01/2013   Procedure: INSERTION OF MESH;  Surgeon: Jamesetta So, MD;  Location: [facility];  Service: General;  Laterality: Right;  . LOWER EXTREMITY ANGIOGRAPHY Right 08/25/2018   Procedure: LOWER EXTREMITY ANGIOGRAPHY;  Surgeon: Wellington Hampshire, MD;  Location: [place] [facility];  Service: Cardiovascular;  Laterality: Right;  . LOWER EXTREMITY ANGIOGRAPHY N/A 11/25/2018   Procedure: LOWER EXTREMITY ANGIOGRAPHY;  Surgeon: Wellington Hampshire, MD;  Location: [place] [facility];  Service: Cardiovascular;  Laterality: N/A;  . NM MYOCAR PERF WALL MOTION  06/05/2010   no significant ischemia  . PERIPHERAL VASCULAR ATHERECTOMY Right 08/03/2018   Procedure: PERIPHERAL VASCULAR ATHERECTOMY;  Surgeon: Lorretta Harp, MD;  Location: [place] [facility];  Service: Cardiovascular;  Laterality: Right;  . PERIPHERAL VASCULAR ATHERECTOMY Left 11/23/2018   Procedure: PERIPHERAL VASCULAR ATHERECTOMY;  Surgeon: Lorretta Harp, MD;  Location: [place] [facility];  Service: Cardiovascular;  Laterality: Left;  sfa with dc balloon  . PERIPHERAL VASCULAR BALLOON ANGIOPLASTY Left 11/25/2018   Procedure: PERIPHERAL VASCULAR BALLOON ANGIOPLASTY;  Surgeon: Wellington Hampshire, MD;  Location: [place] [facility];  Service: Cardiovascular;  Laterality: Left;  popliteal  . PERIPHERAL VASCULAR INTERVENTION Left 11/25/2018   Procedure: PERIPHERAL VASCULAR INTERVENTION;  Surgeon: Wellington Hampshire, MD;  Location: [place]  [facility];  Service: Cardiovascular;  Laterality: Left;  tibial peroneal trunk and peroneal stents   . stent  06/14/2010   left leg  . STUMP REVISION Left 01/20/2019   Procedure: REVISION LEFT BELOW KNEE AMPUTATION;  Surgeon: [name], MD;  Location: [place];  Service: Orthopedics;  Laterality: Left;  . US ECHOCARDIOGRAPHY  01/21/2006   moderate mitral annular ca+, mild MR, AOV moderately sclerotic.     Medications Prior to Admission: Prior to Admission medications   Medication Sig Start Date End Date Taking? Authorizing Provider  acetaminophen (TYLENOL) 500 MG tablet Take 500 mg by mouth every 6 (six) hours as needed for headache (pain).    [provider]  amLODipine (NORVASC) 10 MG tablet Take 1 tablet (10 mg total) by mouth daily. 11/16/19 02/14/20  Hilty, Nadean Corwin, MD  apixaban (ELIQUIS) 5 MG TABS tablet Take 2.5 mg by mouth daily.    [provider]  Ascorbic Acid (VITAMIN C) 1000 MG tablet Take 1,000 mg by mouth daily.     [provider]  atorvastatin (LIPITOR) 40 MG tablet Take 1 tablet (40 mg total) by mouth  every evening. 10/25/19   Barrett, Evelene Croon, PA-C  betamethasone dipropionate 0.05 % cream Apply 1 application topically 2 (two) times daily. 12/03/19   [provider]  carvedilol (COREG) 6.25 MG tablet TAKE 1 TABLET(6.25 MG) BY MOUTH TWICE DAILY WITH A MEAL 06/05/20   Hilty, Nadean Corwin, MD  cephALEXin (KEFLEX) 250 MG capsule Take 250 mg by mouth daily. 03/06/20   [provider]  Cholecalciferol (VITAMIN D-3) 125 MCG (5000 UT) TABS Take 5,000 Units by mouth every evening.    [provider]  clopidogrel (PLAVIX) 75 MG tablet Take 1 tablet (75 mg total) by mouth daily with breakfast. 12/03/19   Hilty, Nadean Corwin, MD  co-enzyme Q-10 30 MG capsule Take 100 mg by mouth daily.    [provider]  Cranberry-Vitamin C (AZO CRANBERRY URINARY TRACT PO) Take 1 tablet by mouth daily.    [provider]  famotidine (PEPCID)  20 MG tablet Take 1 tablet (20 mg total) by mouth daily. 11/12/18   Johnson, Clanford L, MD  ferrous sulfate 325 (65 FE) MG tablet Take 325 mg by mouth 2 (two) times daily with a meal.    [provider]  glipiZIDE (GLUCOTROL) 10 MG tablet Take 10 mg by mouth 2 (two) times daily.    [provider]  latanoprost (XALATAN) 0.005 % ophthalmic solution Place 1 drop into both eyes at bedtime. 09/30/19   [provider]  levothyroxine (SYNTHROID) 75 MCG tablet Take 75 mcg by mouth daily before breakfast.  02/12/19   [provider]  nitrofurantoin (MACRODANTIN) 100 MG capsule Take 1 capsule (100 mg total) by mouth at bedtime. 03/28/20   Rosiland Oz, MD  [facility] ULTRA test strip 1 each 2 (two) times daily. 04/15/19   [provider]  polyethylene glycol (MIRALAX) 17 g packet Take 17 g by mouth daily as needed for moderate constipation. 12/09/18   Mercy Riding, MD  pyridoxine (B-6) 100 MG tablet Take 100 mg by mouth daily.    [provider]  traMADol (ULTRAM) 50 MG tablet Take 1 tablet (50 mg total) by mouth every 6 (six) hours as needed. 02/15/19   Lovorn, Jinny Blossom, MD  vitamin E 400 UNIT capsule Take 400 Units by mouth daily.    [provider]  zinc gluconate 50 MG tablet Take 50 mg by mouth daily.    [provider]     Allergies:    Allergies  Allergen Reactions  . Iodinated Diagnostic Agents Other (See Comments)    caused Fever  . Dye Fdc Red [Red Dye] Hives    Tolerated red Docusate, Niferex    Social History:   Social History   Socioeconomic History  . Marital status: Widowed    Spouse name: Not on file  . Number of children: 3  . Years of education: 58 th  . Highest education level: Not on file  Occupational History  . Occupation: Retired Psychologist, sport and exercise  Tobacco Use  . Smoking status: Former Smoker    Quit date: 01/13/1965    Years since quitting: 55.4  . Smokeless tobacco: Former Systems developer    Types: Secondary school teacher   . Vaping Use: Never used  Substance and Sexual Activity  . Alcohol use: No  . Drug use: No  . Sexual activity: Not Currently  Other Topics Concern  . Not on file  Social History Narrative    Has 2 children! Very supportive.    Social Determinants of Health  Financial Resource Strain: Not on file  Food Insecurity: No Food Insecurity  . Worried About Charity fundraiser in the Last Year: Never true  . Ran Out of Food in the Last Year: Never true  Transportation Needs: No Transportation Needs  . Lack of Transportation (Medical): No  . Lack of Transportation (Non-Medical): No  Physical Activity: Not on file  Stress: Not on file  Social Connections: Not on file  Intimate Partner Violence: Not on file    Family History:   The patient's family history includes Cancer in his mother; Heart attack in his brother, brother, and father; Stroke in his father.    ROS:  Please see the history of present illness.  All other ROS reviewed and negative.     Physical Exam/Data:   Vitals:   06/17/20 1330 06/17/20 1400 06/17/20 1430 06/17/20 1554  BP: (!) 155/68 (!) 182/76 (!) 171/76   Pulse: (!) 48     Resp: 17 (!) 22 (!) 23 16  Temp:    (!) 97.3 F (36.3 C)  TempSrc:    Oral  SpO2: 93%     Weight:      Height:        Intake/Output Summary (Last 24 hours) at 06/17/2020 1605 Last data filed at 06/17/2020 1035 Gross per 24 hour  Intake --  Output 1250 ml  Net -1250 ml   Last 3 Weights 06/17/2020 03/28/2020 03/08/2020  Weight (lbs) 220 lb 0.3 oz 220 lb 210 lb  Weight (kg) 99.8 kg 99.791 kg 95.255 kg     Body mass index is 33.45 kg/m.  General:  Pleasant, elderly male appearing in no acute distress.  HEENT: normal Lymph: no adenopathy Neck: JVD at 8 cm.  Endocrine:  No thryomegaly Vascular: No carotid bruits;  Cardiac:  normal S1, S2; RRR with frequent ectopic beats. 3/6 SEM along RUSB.  Lungs: mild rales along bases bilaterally. No wheezing.  Abd: soft, nontender, no hepatomegaly   Ext: Bilateral BKA. No pitting edema.  Skin: warm and dry  Neuro:  CNs 2-12 intact, no focal abnormalities noted Psych:  Normal affect. A&Ox2 (person, place)   EKG:  The ECG that was done  was personally reviewed and demonstrates NSR, HR 67 with known RBBB.   Relevant CV Studies:  Echocardiogram: 08/2019 IMPRESSIONS    1. Left ventricular ejection fraction, by estimation, is 55 to 60%. The  left ventricle has normal function. The left ventricle has no regional  wall motion abnormalities. There is moderate left ventricular hypertrophy.  Left ventricular diastolic  parameters are consistent with Grade I diastolic dysfunction (impaired  relaxation). Elevated left atrial pressure.  2. Right ventricular systolic function is normal. The right ventricular  size is normal.  3. The mitral valve is normal in structure. No evidence of mitral valve  regurgitation. No evidence of mitral stenosis.  4. The aortic valve has an indeterminant number of cusps. Aortic valve  regurgitation is not visualized. Moderate to severe aortic valve  stenosis.Aortic valve mean gradient measures 27.4 mmHg. Aortic valve peak  gradient measures 44.6 mmHg. Aortic valve  area, by VTI measures 0.77 cm.   Laboratory Data:  High Sensitivity Troponin:   Recent Labs  Lab 06/17/20 0313 06/17/20 0546  TROPONINIHS 50* 519*      Chemistry Recent Labs  Lab 06/17/20 0313  NA 130*  K 3.6  CL 95*  CO2 26  GLUCOSE 207*  BUN 43*  CREATININE 2.87*  CALCIUM 8.8*  GFRNONAA 20*  ANIONGAP 9    Recent Labs  Lab 06/17/20 0313  PROT 7.9  ALBUMIN 3.5  AST 22  ALT 18  ALKPHOS 55  BILITOT 0.8   Hematology Recent Labs  Lab 06/17/20 0313  WBC 13.2*  RBC 3.68*  HGB 11.9*  HCT 36.2*  MCV 98.4  MCH 32.3  MCHC 32.9  RDW 14.0  PLT 203   BNP Recent Labs  Lab 06/17/20 0313  BNP 412.0*    DDimer No results for input(s): DDIMER in the last 168 hours.   Radiology/Studies:  DG Chest Portable 1  View  Result Date: 06/17/2020 CLINICAL DATA:  Shortness of breath. Congestive heart failure prior myocardial infarction. EXAM: PORTABLE CHEST 1 VIEW COMPARISON:  Chest x-ray 02/24/2020, chest x-ray 12/08/2019 FINDINGS: The heart size and mediastinal contours are unchanged. Aortic calcifications. No focal consolidation. Redemonstration of coarsened interstitial markings with no overt pulmonary edema. At least small volume left pleural effusion. No pneumothorax. No acute osseous abnormality. Degenerative changes of bilateral shoulders. IMPRESSION: At least small volume left pleural effusion. Electronically Signed   By: Iven Finn M.D.   On: 06/17/2020 04:20     Assessment and Plan:   1. NSTEMI - He was admitted for a similar event in 09/2019 and medical management was pursued at that time given his comorbidities and advanced CKD. Presents back with mostly right flank pain but family members report he has been experiencing more dyspnea. - Initial Hs Troponin 50 with repeat of 519. EKG shows NSR, HR 67 with known RBBB. He was started on Heparin and transferred to [facility] for further evaluation. At this time, he continues to deny any anginal symptoms. - Reviewed with Dr. Sallyanne Kuster and will plan to switch back from Heparin to Eliquis as he was on this prior to admission. No plans for ischemic testing as previously discussed given his comorbidities. I did review with the patient and his family and he wishes to be partial code with no intubation. - Will continue PTA Atorvastatin 40 mg daily, Coreg 6.25mg  BID and Plavix 75 mg daily.  2. Acute HFpEF - Family members report he has experienced some dyspnea with abdominal distention and CXR on admission showed a small left pleural effusion. BNP elevated to 412.   - He received a dose of IV Lasix 40 mg this morning. Will dose again.  Reassess in the morning. Will order a repeat echocardiogram but as previously discussed, this would not change his management  strategy.  3.  Aortic stenosis - This was moderate to severe by echocardiogram in 08/2019 and he is not felt to be a TAVR candidate given his multiple comorbidities and CKD. He would benefit from palliative care consult for goals of care discussion.  4. Paroxysmal atrial fibrillation - He has been in normal sinus rhythm with frequent PVC's and episodes of bigeminy at times.  Will resume PTA Coreg 6.25 mg twice daily along with Eliquis for anticoagulation.    5. Stage 4 CKD  - Followed by Dr. Theador Hawthorne. Creatinine 2.87 on admission which is close to his baseline of 2.5 - 2.6).      Risk Assessment/Risk Scores:    TIMI Risk Score for Unstable Angina or Non-ST Elevation MI:   The patient's TIMI risk score is  , which indicates a  % risk of all cause mortality, new or recurrent myocardial infarction or need for urgent revascularization in the next 14 days.{  Severity of Illness: The appropriate patient status for this patient is INPATIENT. Inpatient  status is judged to be reasonable and necessary in order to provide the required intensity of service to ensure the patient's safety. The patient's presenting symptoms, physical exam findings, and initial radiographic and laboratory data in the context of their chronic comorbidities is felt to place them at high risk for further clinical deterioration. Furthermore, it is not anticipated that the patient will be medically stable for discharge from the hospital within 2 midnights of admission. The following factors support the patient status of inpatient.   " The patient's presenting symptoms include dyspnea. " The worrisome physical exam findings include rales and significant murmur. " The initial radiographic and laboratory data are worrisome because of elevated troponin and elevated BNP. " The chronic co-morbidities include PVD, Stage 4 CKD, HTN, HLD, and PAF.   * I certify that at the point of admission it is my clinical judgment that the patient  will require inpatient hospital care spanning beyond 2 midnights from the point of admission due to high intensity of service, high risk for further deterioration and high frequency of surveillance required.*    For questions or updates, please contact [place] Please consult [email or web address] for contact info under     Signed, Erma Heritage, PA-C  06/17/2020 4:05 PM   I have seen and examined the patient along with Erma Heritage, PA-C .  I have reviewed the chart, notes and new data.  I agree with PA/NP's note.  Key new complaints: He does not have chest pain at this time and does not recall ever complaining of chest discomfort.  He did have some right flank pain when he was in the emergency room (this was confirmed by the patient's nurse and by his family), but he does not recall complaining of that either.  He denies dyspnea.  He is complained of pruritus on his back, but there is no visible rash.  He is extremely sedentary on regular days due to his amputations, but does go to physical therapy once or twice a week. Key examination changes: Clear lungs, no jugular venous distention, regular rate and rhythm with occasional ectopy, late peaking murmur of aortic stenosis with a very faint S2, no diastolic murmurs, bilateral below the knee amputations without edema. Key new findings / data: Sinus rhythm with frequent monomorphic PVCs seen on telemetry; ECG shows sinus rhythm with old right bundle branch block; high-sensitivity troponin has increased from 50-519, small left pleural effusion on chest x-ray without evidence of pulmonary edema, creatinine 2.87 slightly worse than baseline 2.5, elevated WBC 13.2  PLAN: Doubt acute coronary syndrome. Etiology of his elevated troponin remains uncertain. Physical exam suggest severe or even critical aortic stenosis.  As such, even relatively small pathophysiological stressors could lead to some degree of subendocardial ischemia and troponin  release. Currently appears well, hemodynamically compensated and without any complaints. Will monitor overnight and check troponin again in the morning. He is not a candidate for invasive coronary evaluation and has been deemed to be an inappropriate candidate for aortic valve replacement either SAVR or TAVR. Pointed out with clarity to the family that he is very likely to eventually pass away due to untreatable aortic stenosis, which is a disorder that progresses slowly, but inevitably. Clarified with family, they do not want intubation or mechanical ventilation, but would be agreeable to CPR and medications if he should have cardiac arrest.-Partial code DO NOT INTUBATE.  Sanda Klein, MD, [place] [phone] 06/17/2020, 5:54 PM

## 2020-06-17 NOTE — Progress Notes (Signed)
ANTICOAGULATION CONSULT NOTE - Initial Consult  Pharmacy Consult for Heparin  Indication: chest pain/ACS and atrial fibrillation  Allergies  Allergen Reactions  . Iodinated Diagnostic Agents Other (See Comments)    caused Fever  . Dye Fdc Red [Red Dye] Hives    Tolerated red Docusate, Niferex    Patient Measurements: Height: 5\' 8"  (172.7 cm) Weight: 99.8 kg (220 lb 0.3 oz) IBW/kg (Calculated) : 68.4  Vital Signs: Temp: 97.7 F (36.5 C) (06/04 0313) Temp Source: Oral (06/04 0313) BP: 134/66 (06/04 0630) Pulse Rate: 55 (06/04 0630)  Labs: Recent Labs    06/17/20 0313 06/17/20 0546  HGB 11.9*  --   HCT 36.2*  --   PLT 203  --   CREATININE 2.87*  --   TROPONINIHS 50* 519*    Estimated Creatinine Clearance: 19.6 mL/min (A) (by C-G formula based on SCr of 2.87 mg/dL (H)).   Medical History: Past Medical History:  Diagnosis Date  . Arthritis   . Borderline diabetic   . CHF (congestive heart failure) ([place])   . Chronic kidney disease   . Diabetes mellitus without complication ([place])   . Dysrhythmia   . Glaucoma   . Hyperlipidemia   . Hypertension   . Hypothyroidism   . Myocardial infarction ([place])   . PVD (peripheral vascular disease) ([place])   . Sleep apnea    Cpap ordered but doesnt use  . Urinary retention due to benign prostatic hyperplasia 12/28/2018   Assessment: 87 y/o M with elevated troponin, starting heparin, pt is on Apixaban PTA for afib, last dose was 6/3 AM, ok to start heparin now, anticipate using aPTT to dose for now, Hgb 11.9, noted renal dysfunction.   Goal of Therapy:  Heparin level 0.3-0.7 units/ml aPTT 66-102 seconds Monitor platelets by anticoagulation protocol: Yes   Plan:  Start heparin drip at 1000 units/hr 1600 heparin level and aPTT Daily heparin level and aPTT Monitor for bleeding  Narda Bonds, PharmD, BCPS Clinical Pharmacist Phone: [phone]

## 2020-06-17 NOTE — ED Notes (Signed)
ED Provider at bedside. 

## 2020-06-17 NOTE — ED Notes (Signed)
Family member at Pts bedside.

## 2020-06-17 NOTE — ED Provider Notes (Signed)
[name] EMERGENCY DEPARTMENT Provider Note   CSN: [id number] Arrival date & time: 06/17/20  4403     History Chief Complaint  Patient presents with  . Shortness of Breath    [name] is a 87 y.o. male.  Here with his son who provides most of the history.  Apparently patient was having some worsening shortness of breath tonight and his son checked his pulse ox at home and it was below 90 so brought here for further evaluation.  On arrival patient already received a DuoNeb.  He is feeling better.  Son states he actually looks a lot better as well.  Patient with some memory issues and not able to offer much history.  He states he feels fine.   Shortness of Breath      Past Medical History:  Diagnosis Date  . Arthritis   . Borderline diabetic   . CHF (congestive heart failure) ([place])   . Chronic kidney disease   . Diabetes mellitus without complication ([place])   . Dysrhythmia   . Glaucoma   . Hyperlipidemia   . Hypertension   . Hypothyroidism   . Myocardial infarction ([place])   . PVD (peripheral vascular disease) ([place])   . Sleep apnea    Cpap ordered but doesnt use  . Urinary retention due to benign prostatic hyperplasia 12/28/2018    Patient Active Problem List   Diagnosis Date Noted  . Uncontrolled type 2 diabetes mellitus with hyperglycemia, without long-term current use of insulin ([place]) 02/25/2020  . CKD (chronic kidney disease) stage 4, GFR 15-29 ml/min (HCC) 02/25/2020  . Acute UTI   . AMS (altered mental status) 02/24/2020  . PNA (pneumonia) 12/06/2019  . Sepsis due to PNA 12/06/2019  . Acute metabolic encephalopathy 47/42/5956  . AKI (acute kidney injury) ([place])   . Aortic valve stenosis   . Palliative care encounter   . NSTEMI (non-ST elevated myocardial infarction) ([name]) 10/04/2019  . Acute kidney injury superimposed on CKD ([place]) 10/04/2019  . Hypokalemia 10/04/2019  . Weakness generalized 04/05/2019  . S/P BKA (below knee amputation) unilateral,  left ([place]) 02/15/2019  . Pressure injury of skin 02/03/2019  . TIA (transient ischemic attack) 02/03/2019  . Transient speech disturbance 02/02/2019  . Chronic indwelling Foley catheter 01/22/2019  . Postoperative wound infection 01/20/2019  . Normocytic anemia 01/18/2019  . Cellulitis 01/18/2019  . Infection of deep incisional surgical site after procedure   . Abnormal urinalysis   . Labile blood glucose   . Urinary retention   . S/P bilateral BKA (below knee amputation) ([place])   . Acute blood loss anemia   . Urinary retention due to benign prostatic hyperplasia 12/28/2018  . Unilateral complete BKA, left, initial encounter ([place]) 12/25/2018  . Dehiscence of amputation stump ([place])   . History of transmetatarsal amputation of left foot ([place]) 12/09/2018  . Critical limb ischemia with history of revascularization of same extremity ([place])   . Gangrene of left foot ([place])   . Diabetic ulcer of toe of left foot associated with type 2 diabetes mellitus ([place])   . Cellulitis of left lower extremity   . Toe infection 11/18/2018  . Peripheral edema   . Acute on chronic diastolic CHF (congestive heart failure) ([place]) 11/09/2018  . S/P BKA (below knee amputation) unilateral, right ([place]) 10/05/2018  . Hyponatremia   . Essential hypertension   . Postoperative pain   . Pain of left heel   . Unable to maintain weight-bearing   . Type  2 diabetes mellitus with diabetic peripheral angiopathy with gangrene ([place])   . Anemia due to acute blood loss   . Chronic kidney disease, stage 3a   . Acquired absence of right leg below knee ([place]) 09/07/2018  . Gangrene of right foot ([place])   . Goals of care, counseling/discussion   . Palliative care by specialist   . Type 2 diabetes mellitus with vascular disease ([place])   . Severe protein-calorie malnutrition ([place])   . Ischemic foot 08/20/2018  . Critical lower limb ischemia ([place]) 08/03/2018  . Type 2 diabetes mellitus with foot ulcer, without long-term current use  of insulin ([place])   . Gastroesophageal reflux disease   . Cellulitis of great toe of right foot 05/29/2018  . Atrial fibrillation, chronic   . Chest pain 07/25/2017  . PAF (paroxysmal atrial fibrillation) ([place]) 07/25/2017  . BPH (benign prostatic hyperplasia) 07/25/2017  . Shortness of breath 05/11/2017  . Hypothyroidism 05/11/2017  . Chronic diastolic CHF (congestive heart failure) ([place]) 05/11/2017  . Moderate aortic stenosis 06/12/2016  . RBBB 06/12/2016  . Peripheral vascular disease ([place]) 08/04/2012  . Essential hypertension, benign 08/04/2012  . Hyperlipidemia 08/04/2012  . Dizziness 08/04/2012    Past Surgical History:  Procedure Laterality Date  . ABDOMINAL AORTOGRAM W/LOWER EXTREMITY N/A 07/27/2018   Procedure: ABDOMINAL AORTOGRAM W/LOWER EXTREMITY;  Surgeon: Lorretta Harp, MD;  Location: [place] [facility];  Service: Cardiovascular;  Laterality: N/A;  . AMPUTATION Right 08/28/2018   Procedure: RIGHT BELOW KNEE AMPUTATION;  Surgeon: [name], MD;  Location: [place];  Service: Orthopedics;  Laterality: Right;  . AMPUTATION Left 12/02/2018   Procedure: LEFT TRANSMETATARSAL AMPUTATION AND NEGATIVE PRESSURE WOUND VAC PLACEMENT;  Surgeon: [name], MD;  Location: [place];  Service: Orthopedics;  Laterality: Left;  . AMPUTATION Left 12/18/2018   Procedure: LEFT BELOW KNEE AMPUTATION;  Surgeon: [name], MD;  Location: [place];  Service: Orthopedics;  Laterality: Left;  . APPENDECTOMY  1943  . BELOW KNEE LEG AMPUTATION Left 12/18/2018  . CATARACT EXTRACTION  2012   x2  . COLONOSCOPY N/A 11/01/2014   Procedure: COLONOSCOPY;  Surgeon: Aviva Signs, MD;  [facility];  Service: Gastroenterology;  Laterality: N/A;  . ESOPHAGOGASTRODUODENOSCOPY N/A 11/01/2014   Procedure: ESOPHAGOGASTRODUODENOSCOPY (EGD);  Surgeon: Aviva Signs, MD;  [facility];  Service: Gastroenterology;  Laterality: N/A;  . HERNIA REPAIR Left   . INGUINAL HERNIA REPAIR Right  03/01/2013   Procedure: RIGHT INGUINAL HERNIORRHAPHY;  Surgeon: Jamesetta So, MD;  Location: [facility];  Service: General;  Laterality: Right;  . INSERTION OF MESH Right 03/01/2013   Procedure: INSERTION OF MESH;  Surgeon: Jamesetta So, MD;  Location: [facility];  Service: General;  Laterality: Right;  . LOWER EXTREMITY ANGIOGRAPHY Right 08/25/2018   Procedure: LOWER EXTREMITY ANGIOGRAPHY;  Surgeon: Wellington Hampshire, MD;  Location: [place] [facility];  Service: Cardiovascular;  Laterality: Right;  . LOWER EXTREMITY ANGIOGRAPHY N/A 11/25/2018   Procedure: LOWER EXTREMITY ANGIOGRAPHY;  Surgeon: Wellington Hampshire, MD;  Location: [place] [facility];  Service: Cardiovascular;  Laterality: N/A;  . NM MYOCAR PERF WALL MOTION  06/05/2010   no significant ischemia  . PERIPHERAL VASCULAR ATHERECTOMY Right 08/03/2018   Procedure: PERIPHERAL VASCULAR ATHERECTOMY;  Surgeon: Lorretta Harp, MD;  Location: [place] [facility];  Service: Cardiovascular;  Laterality: Right;  . PERIPHERAL VASCULAR ATHERECTOMY Left 11/23/2018   Procedure: PERIPHERAL VASCULAR ATHERECTOMY;  Surgeon: Lorretta Harp, MD;  Location: [place] [facility];  Service: Cardiovascular;  Laterality: Left;  sfa with dc balloon  . PERIPHERAL VASCULAR BALLOON ANGIOPLASTY Left 11/25/2018   Procedure: PERIPHERAL VASCULAR BALLOON ANGIOPLASTY;  Surgeon: Wellington Hampshire, MD;  Location: [place] [facility];  Service: Cardiovascular;  Laterality: Left;  popliteal  . PERIPHERAL VASCULAR INTERVENTION Left 11/25/2018   Procedure: PERIPHERAL VASCULAR INTERVENTION;  Surgeon: Wellington Hampshire, MD;  Location: [place] [facility];  Service: Cardiovascular;  Laterality: Left;  tibial peroneal trunk and peroneal stents   . stent  06/14/2010   left leg  . STUMP REVISION Left 01/20/2019   Procedure: REVISION LEFT BELOW KNEE AMPUTATION;  Surgeon: [name], MD;  Location: [place];  Service: Orthopedics;  Laterality: Left;  . US ECHOCARDIOGRAPHY  01/21/2006   moderate  mitral annular ca+, mild MR, AOV moderately sclerotic.       Family History  Problem Relation Age of Onset  . Cancer Mother   . Heart attack Father   . Stroke Father   . Heart attack Brother   . Heart attack Brother     Social History   Tobacco Use  . Smoking status: Former Smoker    Quit date: 01/13/1965    Years since quitting: 55.4  . Smokeless tobacco: Former Systems developer    Types: Secondary school teacher  . Vaping Use: Never used  Substance Use Topics  . Alcohol use: No  . Drug use: No    Home Medications Prior to Admission medications   Medication Sig Start Date End Date Taking? Authorizing Provider  acetaminophen (TYLENOL) 500 MG tablet Take 500 mg by mouth every 6 (six) hours as needed for headache (pain).    [provider]  amLODipine (NORVASC) 10 MG tablet Take 1 tablet (10 mg total) by mouth daily. 11/16/19 02/14/20  Hilty, Nadean Corwin, MD  apixaban (ELIQUIS) 5 MG TABS tablet Take 2.5 mg by mouth daily.    [provider]  Ascorbic Acid (VITAMIN C) 1000 MG tablet Take 1,000 mg by mouth daily.     [provider]  atorvastatin (LIPITOR) 40 MG tablet Take 1 tablet (40 mg total) by mouth every evening. 10/25/19   Barrett, Evelene Croon, PA-C  betamethasone dipropionate 0.05 % cream Apply 1 application topically 2 (two) times daily. 12/03/19   [provider]  carvedilol (COREG) 6.25 MG tablet TAKE 1 TABLET(6.25 MG) BY MOUTH TWICE DAILY WITH A MEAL 06/05/20   Hilty, Nadean Corwin, MD  cephALEXin (KEFLEX) 250 MG capsule Take 250 mg by mouth daily. 03/06/20   [provider]  Cholecalciferol (VITAMIN D-3) 125 MCG (5000 UT) TABS Take 5,000 Units by mouth every evening.    [provider]  clopidogrel (PLAVIX) 75 MG tablet Take 1 tablet (75 mg total) by mouth daily with breakfast. 12/03/19   Hilty, Nadean Corwin, MD  co-enzyme Q-10 30 MG capsule Take 100 mg by mouth daily.    [provider]  Cranberry-Vitamin C (AZO CRANBERRY URINARY TRACT  PO) Take 1 tablet by mouth daily.    [provider]  famotidine (PEPCID) 20 MG tablet Take 1 tablet (20 mg total) by mouth daily. 11/12/18   Johnson, Clanford L, MD  ferrous sulfate 325 (65 FE) MG tablet Take 325 mg by mouth 2 (two) times daily with a meal.    [provider]  glipiZIDE (GLUCOTROL) 10 MG tablet Take 10 mg by mouth 2 (two) times daily.    [provider]  latanoprost (  XALATAN) 0.005 % ophthalmic solution Place 1 drop into both eyes at bedtime. 09/30/19   [provider]  levothyroxine (SYNTHROID) 75 MCG tablet Take 75 mcg by mouth daily before breakfast.  02/12/19   [provider]  nitrofurantoin (MACRODANTIN) 100 MG capsule Take 1 capsule (100 mg total) by mouth at bedtime. 03/28/20   Rosiland Oz, MD  [facility] ULTRA test strip 1 each 2 (two) times daily. 04/15/19   [provider]  polyethylene glycol (MIRALAX) 17 g packet Take 17 g by mouth daily as needed for moderate constipation. 12/09/18   Mercy Riding, MD  pyridoxine (B-6) 100 MG tablet Take 100 mg by mouth daily.    [provider]  traMADol (ULTRAM) 50 MG tablet Take 1 tablet (50 mg total) by mouth every 6 (six) hours as needed. 02/15/19   Lovorn, Jinny Blossom, MD  vitamin E 400 UNIT capsule Take 400 Units by mouth daily.    [provider]  zinc gluconate 50 MG tablet Take 50 mg by mouth daily.    [provider]    Allergies    Iodinated diagnostic agents and Dye fdc red [red dye]  Review of Systems   Review of Systems  Unable to perform ROS: Dementia  Respiratory: Positive for shortness of breath.     Physical Exam Updated Vital Signs BP 140/64   Pulse (!) 49   Temp 97.7 F (36.5 C) (Oral)   Resp 10   Ht 5\' 8"  (1.727 m)   Wt 99.8 kg   SpO2 94%   BMI 33.45 kg/m   Physical Exam Vitals and nursing note reviewed.  Constitutional:      Appearance: He is well-developed.  HENT:     Head: Normocephalic and atraumatic.   Cardiovascular:     Rate and Rhythm: Normal rate.  Pulmonary:     Effort: Pulmonary effort is normal. No respiratory distress.     Breath sounds: Examination of the right-lower field reveals rales. Examination of the left-lower field reveals rales. Rales present.  Abdominal:     General: There is no distension.  Musculoskeletal:        General: Normal range of motion.     Cervical back: Normal range of motion.     Comments: Bilateral BKA  Skin:    General: Skin is warm and dry.  Neurological:     General: No focal deficit present.     Mental Status: He is alert.     ED Results / Procedures / Treatments   Labs (all labs ordered are listed, but only abnormal results are displayed) Labs Reviewed  CBC WITH DIFFERENTIAL/PLATELET - Abnormal; Notable for the following components:      Result Value   WBC 13.2 (*)    RBC 3.68 (*)    Hemoglobin 11.9 (*)    HCT 36.2 (*)    Neutro Abs 10.0 (*)    All other components within normal limits  COMPREHENSIVE METABOLIC PANEL - Abnormal; Notable for the following components:   Sodium 130 (*)    Chloride 95 (*)    Glucose, Bld 207 (*)    BUN 43 (*)    Creatinine, Ser 2.87 (*)    Calcium 8.8 (*)    GFR, Estimated 20 (*)    All other components within normal limits  BRAIN NATRIURETIC PEPTIDE - Abnormal; Notable for the following components:   B Natriuretic Peptide 412.0 (*)    All other components within normal limits  TROPONIN I (HIGH  SENSITIVITY) - Abnormal; Notable for the following components:   Troponin I (High Sensitivity) 50 (*)    All other components within normal limits  TROPONIN I (HIGH SENSITIVITY) - Abnormal; Notable for the following components:   Troponin I (High Sensitivity) 519 (*)    All other components within normal limits  SARS CORONAVIRUS 2 (TAT 6-24 HRS)  APTT  HEPARIN LEVEL (UNFRACTIONATED)    EKG EKG Interpretation  Date/Time:  Saturday June 17 2020 03:16:38 EDT Ventricular Rate:  67 PR  Interval:  202 QRS Duration: 161 QT Interval:  485 QTC Calculation: 513 R Axis:   69 Text Interpretation: Sinus rhythm Right bundle branch block Confirmed by Merrily Pew [phone]) on 06/17/2020 4:57:12 AM   Radiology DG Chest Portable 1 View  Result Date: 06/17/2020 CLINICAL DATA:  Shortness of breath. Congestive heart failure prior myocardial infarction. EXAM: PORTABLE CHEST 1 VIEW COMPARISON:  Chest x-ray 02/24/2020, chest x-ray 12/08/2019 FINDINGS: The heart size and mediastinal contours are unchanged. Aortic calcifications. No focal consolidation. Redemonstration of coarsened interstitial markings with no overt pulmonary edema. At least small volume left pleural effusion. No pneumothorax. No acute osseous abnormality. Degenerative changes of bilateral shoulders. IMPRESSION: At least small volume left pleural effusion. Electronically Signed   By: Iven Finn M.D.   On: 06/17/2020 04:20    Procedures .Critical Care Performed by: Merrily Pew, MD Authorized by: Merrily Pew, MD   Critical care provider statement:    Critical care time (minutes):  45   Critical care was necessary to treat or prevent imminent or life-threatening deterioration of the following conditions:  Cardiac failure   Critical care was time spent personally by me on the following activities:  Discussions with consultants, evaluation of patient's response to treatment, examination of patient, ordering and performing treatments and interventions, ordering and review of laboratory studies, ordering and review of radiographic studies, pulse oximetry, re-evaluation of patient's condition, obtaining history from patient or surrogate and review of old charts     Medications Ordered in ED Medications  heparin ADULT infusion 100 units/mL (25000 units/250mL) (has no administration in time range)  furosemide (LASIX) injection 40 mg (40 mg Intravenous Given 06/17/20 0524)    ED Course  I have reviewed the triage vital signs  and the nursing notes.  Pertinent labs & imaging results that were available during my care of the patient were reviewed by me and considered in my medical decision making (see chart for details).    MDM Rules/Calculators/A&P                          eval for fluid overload.   Does appear to have mild chf exac. Lasix initiated.   troponins from 50 to 519 c/w NSTEMI. Heparin started.   Discussed with Dr. Sallyanne Kuster who recommends admission to his service. Bed request placed.   Final Clinical Impression(s) / ED Diagnoses Final diagnoses:  NSTEMI (non-ST elevated myocardial infarction) [facility])    Rx / [place] Orders ED Discharge Orders    None       Isaac Dubie, Corene Cornea, MD 06/17/20 [phone]

## 2020-06-17 NOTE — ED Triage Notes (Signed)
BIB [facility] c/o heartburn at 0100 progressing to shortness of breath. Hx of CHF and prior MI.

## 2020-06-17 NOTE — ED Notes (Signed)
X-ray at bedside

## 2020-06-17 NOTE — ED Notes (Signed)
Date and time results received: 06/17/20 0644   Test: TROPONIN Critical Value: 519  Name of Provider Notified: Mes[place], MD

## 2020-06-18 ENCOUNTER — Inpatient Hospital Stay (HOSPITAL_COMMUNITY): Payer: Medicare Other

## 2020-06-18 DIAGNOSIS — I351 Nonrheumatic aortic (valve) insufficiency: Secondary | ICD-10-CM

## 2020-06-18 DIAGNOSIS — I5033 Acute on chronic diastolic (congestive) heart failure: Secondary | ICD-10-CM

## 2020-06-18 DIAGNOSIS — R079 Chest pain, unspecified: Secondary | ICD-10-CM

## 2020-06-18 DIAGNOSIS — I350 Nonrheumatic aortic (valve) stenosis: Secondary | ICD-10-CM

## 2020-06-18 DIAGNOSIS — I248 Other forms of acute ischemic heart disease: Secondary | ICD-10-CM

## 2020-06-18 DIAGNOSIS — I480 Paroxysmal atrial fibrillation: Secondary | ICD-10-CM

## 2020-06-18 LAB — ECHOCARDIOGRAM COMPLETE
AR max vel: 0.76 cm2
AV Area VTI: 0.83 cm2
AV Area mean vel: 0.77 cm2
AV Mean grad: 26.5 mmHg
AV Peak grad: 48.2 mmHg
Ao pk vel: 3.47 m/s
Area-P 1/2: 1.97 cm2
Height: 68 in
S' Lateral: 2.8 cm
Weight: 3350.99 oz

## 2020-06-18 LAB — GLUCOSE, CAPILLARY
Glucose-Capillary: 118 mg/dL — ABNORMAL HIGH (ref 70–99)
Glucose-Capillary: 145 mg/dL — ABNORMAL HIGH (ref 70–99)
Glucose-Capillary: 121 mg/dL — ABNORMAL HIGH (ref 70–99)
Glucose-Capillary: 142 mg/dL — ABNORMAL HIGH (ref 70–99)

## 2020-06-18 LAB — BASIC METABOLIC PANEL
Anion gap: 14 (ref 5–15)
BUN: 44 mg/dL — ABNORMAL HIGH (ref 8–23)
CO2: 22 mmol/L (ref 22–32)
Calcium: 8.8 mg/dL — ABNORMAL LOW (ref 8.9–10.3)
Chloride: 96 mmol/L — ABNORMAL LOW (ref 98–111)
Creatinine, Ser: 3.14 mg/dL — ABNORMAL HIGH (ref 0.61–1.24)
GFR, Estimated: 18 mL/min — ABNORMAL LOW (ref >60)
Glucose, Bld: 123 mg/dL — ABNORMAL HIGH (ref 70–99)
Potassium: 4.1 mmol/L (ref 3.5–5.1)
Sodium: 132 mmol/L — ABNORMAL LOW (ref 135–145)

## 2020-06-18 LAB — TROPONIN I (HIGH SENSITIVITY): Troponin I (High Sensitivity): 1224 ng/L (ref <18)

## 2020-06-18 LAB — MAGNESIUM: Magnesium: 2.3 mg/dL (ref 1.7–2.4)

## 2020-06-18 MED ORDER — AMLODIPINE BESYLATE 10 MG PO TABS
10.0000 mg | ORAL_TABLET | Freq: Every day | ORAL | Status: DC
  Administered 2020-06-18 – 2020-06-19 (×2): 10 mg via ORAL
  Filled 2020-06-18 (×2): qty 1

## 2020-06-18 MED ORDER — FUROSEMIDE 10 MG/ML IJ SOLN
40.0000 mg | Freq: Once | INTRAMUSCULAR | Status: AC
  Administered 2020-06-18: 40 mg via INTRAVENOUS
  Filled 2020-06-18: qty 4

## 2020-06-18 MED ORDER — POLYETHYLENE GLYCOL 3350 17 G PO PACK
17.0000 g | PACK | Freq: Every day | ORAL | Status: DC | PRN
  Administered 2020-06-18: 17 g via ORAL
  Filled 2020-06-18: qty 1

## 2020-06-18 NOTE — Discharge Summary (Signed)
Discharge Summary    Patient ID: [name] MRN: [id number]; DOB: 05-Aug-1927  Admit date: 06/17/2020 Discharge date: 06/19/2020  PCP:  Redmond School, MD   [facility] Providers Cardiologist:  Pixie Casino, MD   {  Discharge Diagnoses    Principal Problem:   Demand ischemia [facility]) Active Problems:   Peripheral vascular disease ([employer])   Essential hypertension, benign   Hyperlipidemia   Moderate aortic stenosis   PAF (paroxysmal atrial fibrillation) (HCC)   Type 2 diabetes mellitus with vascular disease ([place])   Acute on chronic diastolic CHF (congestive heart failure) ([place])    Diagnostic Studies/Procedures    Echocardiogram 06/18/2020: Impressions: 1. Left ventricular ejection fraction, by estimation, is 65 to 70%. The  left ventricle has normal function. The left ventricle has no regional  wall motion abnormalities. There is mild concentric left ventricular  hypertrophy. Left ventricular diastolic  parameters are consistent with Grade II diastolic dysfunction  (pseudonormalization). Elevated left atrial pressure.  2. Right ventricular systolic function is normal. The right ventricular  size is normal. There is mildly elevated pulmonary artery systolic  pressure.  3. Left atrial size was mildly dilated.  4. The mitral valve is normal in structure. Mild mitral valve  regurgitation. No evidence of mitral stenosis.  5. The aortic valve is tricuspid. There is severe calcifcation of the  aortic valve. There is severe thickening of the aortic valve. Aortic valve  regurgitation is mild. Moderate to severe aortic valve stenosis.  6. The inferior vena cava is dilated in size with >50% respiratory  variability, suggesting right atrial pressure of 8 mmHg.   Comparison(s): Prior images reviewed side by side. The left ventricular  function is unchanged. The left ventricular diastolic function is  significantly worse. The left ventricular wall motion unchanged.   _____________   History of Present Illness     [name] is a 87 y.o. male with a history of presumed CAD with NSTEMI in 09/2019 which was medically treated given CKD, chronic diastolic CHF, paroxysmal atrial fibrillation on Eliquis, moderate to severe aortic stenosis on Echo in 08/2019, PAD s/p bilateral BKAs, hypertension, hyperlipidemia, type 2 diabetes mellitus, CKD stage III-IV who was transferred from [facility] for further evaluation of chest pain.  Patient was last examined by Dr. Debara Pickett in 02/2020 after he was recently admitted with altered mental status in the setting of a UTI. He was overall doing well at the time of his visit and although he was not felt to be a candidate during prior evaluation due to his medical comobordities and advanced CKD, a repeat Echo was planned for later in the year.  Patient presented to the [facility] ED during the early morning hour of 06/17/2020 for evaluation of chest pain. EKG showed normal sinus rhythm with known RBBB. Initial high-sensitivity troponin was 50 and repeat was 519. BNP was elevated at 412. Chest x-ray showed small left pleural effusion. Initial labs showed WBC of 132., Hgb 11.9, Plts 203, Na 130, K 3.6, Cr 2.87 (baseline 2.5 to 2.6). Given elevated enzymes and no Cardiology service at [facility] on the weekend, he was started on IV Heparin and transferred to [facility] for further management.   Upon arrival to [facility], patient denied any chest pain. Most history was provided by his daughter. Daughter reported he lives at home but his 3 children rotate taking care of him. He is mostly bed-bound but sits in a wheelchair to go placed. He was previously participating  in PT once a week. However, over the prior week, daughter reports he has been sleeping more and has not been consuming much food. They have been supplementing with protein shakes. The day prior to presentation, he developed pain along his right flank and this worsened throughout the  night with associated dyspnea. Therefore, they went to the [facility] ED for further evaluation. Patient had no specific complaints of orthopnea, PND, or pitting edema. Family have noted some abdominal distention. Patient also reported itching along his back that improved with topical steroids.   Hospital Course     Consultants: None  Demand Ischemia Patient presented with reports of chest pain. After further discussion with son, sounds like patient was complaining of "indigestion" that did not improve with multiple doses of TUMS. High-sensitivity troponin elevated at 50 >> 519 >> 1,224. EKG shows normal sinus rhythm with known RBBB. He was started on IV Heparin at [facility] but this was discontinued on arrival to [facility] given no anginal complaints.   Echo showed LVEF of 65-70% with normal wall motion, grade 2 diastolic dysfunction, moderate to severe AS, and mild MR. It is possible that patient had a small MI but it was felt to more likely be demand ischemia in the setting of CHF and moderate/severe AS.   Plan is to continue to treat medically given age, dementia, and advanced CKD. Continue Plavix, and statin. He was started on Coreg 6.25 mg bid, but the dose was reduced to 3.125 mg bid because of bradycardia w/ HR 40s.  Acute on Chronic Diastolic CHF Family members reports some dyspnea as well as abdominal distension. Chest x-ray showed small left pleural effusion. BNP elevated at 412.   Echo showed LVEF of 19-14%, grade 2 diastolic dysfunction, and elevated left atrial pressures. He received a 3 doses of IV Lasix 40mg  during admission. Net negative 1.390 L. Discharge weight 94 kg.   Will discharge patient on Lasix 80mg  daily in the morning with additional 40mg  in the afternoon as needed for weight gain. Discussed importance of daily weights and sodium/fluid restriction with family.  Home health RN ordered due to change in pt condition and need to monitor the CHF.   Aortic  Stenosis Moderate to severe AS noted on Echo in 08/2019. Echo this admission showed continued moderate to severe AS with mean gradient of 26.5 mmHG and peak gradient 48.2 mmHg. He has not been felt to be a TAVR candidate due to multiple comorbidities and CKD.  Paroxysmal Atrial Fibrillation Maintained sinus rhythm this admission with frequent PVCs (often in bigeminy or trigeminy). Continue Coreg 3.125mg  twice daily. Continue Eliquis 2.5mg  twice daily (reduced dose due to age and renal function).  PAD s/p Bilateral BKAs Patient is mostly bedbound these days but is able to get around in a wheelchair some. PT/OT consulted and recommended HHPT, 24 hr supervision. However, the family feels that PT is too much for him at this time. HHRN will be helpful. No equipment needs.   Hypertension He was restarted on his home amlodipine 10 mg qd. However, his BP was still elevated. Doxazosin 2 mg qd was added as well as low-dose BB.   Hyperlipidemia Continue home Lipitor 40mg  daily.  Type 2 Diabetes Mellitus Placed on sliding scale insulin here. Restart home glucotrol 10 mg bid at discharge.  CKD Stage IV Creatinine 2.87 on admission and 3.16 on day of discharge. Baseline 2.5 to 2.6. Will need repeat BMET in 1 week after increasing Lasix.   Patient seen  and examined by Dr. Oval Linsey and determined to be stable for discharge. Will arrange outpatient follow-up. Add HHRN.  Medications as below.   Did the patient have an acute coronary syndrome (MI, NSTEMI, STEMI, etc) this admission?:  No.   The elevated Troponin was due to the acute medical illness (demand ischemia).      _____________  Discharge Vitals Blood pressure (!) 162/69, pulse (!) 57, temperature (!) 97.4 F (36.3 C), temperature source Oral, resp. rate (!) 21, height 5\' 8"  (1.727 m), weight 94 kg, SpO2 92 %.  Filed Weights   06/17/20 0309 06/18/20 0356 06/19/20 0423  Weight: 99.8 kg 95 kg 94 kg    Labs & Radiologic Studies     CBC Recent Labs    06/17/20 0313  WBC 13.2*  NEUTROABS 10.0*  HGB 11.9*  HCT 36.2*  MCV 98.4  PLT 465   Basic Metabolic Panel Recent Labs    06/18/20 0318 06/18/20 1033 06/19/20 0632  NA 132*  --  135  K 4.1  --  3.3*  CL 96*  --  98  CO2 22  --  27  GLUCOSE 123*  --  128*  BUN 44*  --  42*  CREATININE 3.14*  --  3.16*  CALCIUM 8.8*  --  9.1  MG  --  2.3  --    Liver Function Tests Recent Labs    06/17/20 0313  AST 22  ALT 18  ALKPHOS 55  BILITOT 0.8  PROT 7.9  ALBUMIN 3.5   High Sensitivity Troponin:   Recent Labs  Lab 06/17/20 0313 06/17/20 0546 06/18/20 0318  TROPONINIHS 50* 519* 1,224*    BNP    Component Value Date/Time   BNP 412.0 (H) 06/17/2020 0313   _____________  DG Chest Portable 1 View  Result Date: 06/17/2020 CLINICAL DATA:  Shortness of breath. Congestive heart failure prior myocardial infarction. EXAM: PORTABLE CHEST 1 VIEW COMPARISON:  Chest x-ray 02/24/2020, chest x-ray 12/08/2019 FINDINGS: The heart size and mediastinal contours are unchanged. Aortic calcifications. No focal consolidation. Redemonstration of coarsened interstitial markings with no overt pulmonary edema. At least small volume left pleural effusion. No pneumothorax. No acute osseous abnormality. Degenerative changes of bilateral shoulders. IMPRESSION: At least small volume left pleural effusion. Electronically Signed   By: Iven Finn M.D.   On: 06/17/2020 04:20   ECHOCARDIOGRAM COMPLETE  Result Date: 06/18/2020    ECHOCARDIOGRAM REPORT   Patient Name:   [name] Date of Exam: 06/18/2020 Medical Rec #:  [id number]      Height:       68.0 in Accession #:    [id number]     Weight:       209.4 lb Date of Birth:  21-Oct-1927      BSA:          2.084 m Patient Age:    60 years       BP:           148/76 mmHg Patient Gender: M              HR:           54 bpm. Exam Location:  Inpatient Procedure: 2D Echo Indications:    chest pain  History:        Patient has no prior history  of Echocardiogram examinations,                 most recent 08/26/2019. CHF, CAD, chronic kidney disease, Aortic  Valve Disease, Arrythmias:RBBB; Risk Factors:Diabetes,                 Hypertension and Dyslipidemia.  Sonographer:    Johny Chess Referring Phys: [id number] [facility]  1. Left ventricular ejection fraction, by estimation, is 65 to 70%. The left ventricle has normal function. The left ventricle has no regional wall motion abnormalities. There is mild concentric left ventricular hypertrophy. Left ventricular diastolic parameters are consistent with Grade II diastolic dysfunction (pseudonormalization). Elevated left atrial pressure.  2. Right ventricular systolic function is normal. The right ventricular size is normal. There is mildly elevated pulmonary artery systolic pressure.  3. Left atrial size was mildly dilated.  4. The mitral valve is normal in structure. Mild mitral valve regurgitation. No evidence of mitral stenosis.  5. The aortic valve is tricuspid. There is severe calcifcation of the aortic valve. There is severe thickening of the aortic valve. Aortic valve regurgitation is mild. Moderate to severe aortic valve stenosis.  6. The inferior vena cava is dilated in size with >50% respiratory variability, suggesting right atrial pressure of 8 mmHg. Comparison(s): Prior images reviewed side by side. The left ventricular function is unchanged. The left ventricular diastolic function is significantly worse. The left ventricular wall motion unchanged. FINDINGS  Left Ventricle: Left ventricular ejection fraction, by estimation, is 65 to 70%. The left ventricle has normal function. The left ventricle has no regional wall motion abnormalities. The left ventricular internal cavity size was normal in size. There is  mild concentric left ventricular hypertrophy. Left ventricular diastolic parameters are consistent with Grade II diastolic dysfunction  (pseudonormalization). Elevated left atrial pressure. Right Ventricle: The right ventricular size is normal. No increase in right ventricular wall thickness. Right ventricular systolic function is normal. There is mildly elevated pulmonary artery systolic pressure. The tricuspid regurgitant velocity is 2.90  m/s, and with an assumed right atrial pressure of 8 mmHg, the estimated right ventricular systolic pressure is 73.2 mmHg. Left Atrium: Left atrial size was mildly dilated. Right Atrium: Right atrial size was normal in size. Pericardium: There is no evidence of pericardial effusion. Mitral Valve: The mitral valve is normal in structure. Mild to moderate mitral annular calcification. Mild mitral valve regurgitation. No evidence of mitral valve stenosis. Tricuspid Valve: The tricuspid valve is normal in structure. Tricuspid valve regurgitation is trivial. Aortic Valve: The aortic valve is tricuspid. There is severe calcifcation of the aortic valve. There is severe thickening of the aortic valve. Aortic valve regurgitation is mild. Moderate to severe aortic stenosis is present. Aortic valve mean gradient measures 26.5 mmHg. Aortic valve peak gradient measures 48.2 mmHg. Aortic valve area, by VTI measures 0.83 cm. Pulmonic Valve: The pulmonic valve was grossly normal. Pulmonic valve regurgitation is not visualized. Aorta: The aortic root is normal in size and structure. Venous: The inferior vena cava is dilated in size with greater than 50% respiratory variability, suggesting right atrial pressure of 8 mmHg. IAS/Shunts: No atrial level shunt detected by color flow Doppler.  LEFT VENTRICLE PLAX 2D LVIDd:         4.60 cm  Diastology LVIDs:         2.80 cm  LV e' medial:    4.46 cm/s LV PW:         1.20 cm  LV E/e' medial:  20.1 LV IVS:        1.20 cm  LV e' lateral:   4.57 cm/s LVOT diam:     2.00 cm  LV  E/e' lateral: 19.6 LV SV:         74 LV SV Index:   35 LVOT Area:     3.14 cm  RIGHT VENTRICLE            IVC RV S  prime:     9.79 cm/s  IVC diam: 2.10 cm TAPSE (M-mode): 1.7 cm LEFT ATRIUM             Index       RIGHT ATRIUM           Index LA diam:        4.20 cm 2.02 cm/m  RA Area:     14.00 cm LA Vol (A2C):   53.8 ml 25.81 ml/m RA Volume:   36.50 ml  17.51 ml/m LA Vol (A4C):   60.6 ml 29.08 ml/m LA Biplane Vol: 59.3 ml 28.45 ml/m  AORTIC VALVE AV Area (Vmax):    0.76 cm AV Area (Vmean):   0.77 cm AV Area (VTI):     0.83 cm AV Vmax:           347.00 cm/s AV Vmean:          240.000 cm/s AV VTI:            0.885 m AV Peak Grad:      48.2 mmHg AV Mean Grad:      26.5 mmHg LVOT Vmax:         83.75 cm/s LVOT Vmean:        58.700 cm/s LVOT VTI:          0.235 m LVOT/AV VTI ratio: 0.27  AORTA Ao Root diam: 3.70 cm MITRAL VALVE               TRICUSPID VALVE MV Area (PHT): 1.97 cm    TR Peak grad:   33.8 mmHg MV Decel Time: 385 msec    TR Vmax:        290.48 cm/s MV E velocity: 89.50 cm/s MV A velocity: 81.00 cm/s  SHUNTS MV E/A ratio:  1.10        Systemic VTI:  0.24 m                            Systemic Diam: 2.00 cm Dani Gobble Croitoru MD Electronically signed by Sanda Klein MD Signature Date/Time: 06/18/2020/11:19:57 AM    Final    Disposition   Patient is being discharged home today in good condition.  Follow-up Plans & Appointments     Follow-up Information    Hilty, Nadean Corwin, MD Follow up.   Specialty: Cardiology Why: As scheduled Contact information: [place] [address] [address] [place] Follow up.   Specialty: Cardiology Why: Please come by Dr. Metro Kung office in 1 week for a lab visit so that we can recheck your kidney function and electrolytes. You can come by anytime between 8am-4:30pm (lab is closed from around 1-2pm for lunch). Contact information: [address] [place] [employer] [phone]       Almyra Deforest, [place] Follow up.   Specialties: Cardiology, Radiology Why: See PA for Dr Debara Pickett on July 6th.  Please arrive at 11:00 am for an 11:15 am appt. Contact information: [address] [place] [address] [phone]              Discharge Instructions    (  HEART FAILURE PATIENTS) Call MD:  Anytime you have any of the following symptoms: 1) 3 pound weight gain in 24 hours or 5 pounds in 1 week 2) shortness of breath, with or without a dry hacking cough 3) swelling in the hands, feet or stomach 4) if you have to sleep on extra pillows at night in order to breathe.   Complete by: As directed    Diet - low sodium heart healthy   Complete by: As directed    Increase activity slowly   Complete by: As directed       Discharge Medications   Allergies as of 06/19/2020      Reactions   Iodinated Diagnostic Agents Other (See Comments)   caused Fever   Dye Fdc Red [red Dye] Hives   Tolerated red Docusate, Niferex   Hydralazine       Medication List    STOP taking these medications   nitrofurantoin 100 MG capsule Commonly known as: MACRODANTIN     TAKE these medications   acetaminophen 500 MG tablet Commonly known as: TYLENOL Take 500 mg by mouth every 6 (six) hours as needed for headache (pain).   amLODipine 10 MG tablet Commonly known as: NORVASC Take 1 tablet (10 mg total) by mouth daily.   apixaban 2.5 MG Tabs tablet Commonly known as: ELIQUIS Take 1 tablet (2.5 mg total) by mouth 2 (two) times daily. What changed:   medication strength  when to take this   atorvastatin 40 MG tablet Commonly known as: LIPITOR Take 1 tablet (40 mg total) by mouth every evening.   AZO CRANBERRY PO Take 1 tablet by mouth daily.   betamethasone dipropionate 0.05 % cream Apply 1 application topically 2 (two) times daily.   carvedilol 3.125 MG tablet Commonly known as: COREG Take 1 tablet (3.125 mg total) by mouth 2 (two) times daily with a meal. What changed:   medication strength  See the new instructions.   cephALEXin 250 MG capsule Commonly known as:  KEFLEX Take 250 mg by mouth daily.   ciprofloxacin 250 MG tablet Commonly known as: CIPRO Take 250 mg by mouth daily.   clopidogrel 75 MG tablet Commonly known as: PLAVIX Take 1 tablet (75 mg total) by mouth daily with breakfast.   co-enzyme Q-10 30 MG capsule Take 100 mg by mouth daily.   doxazosin 2 MG tablet Commonly known as: CARDURA Take 1 tablet (2 mg total) by mouth daily.   famotidine 20 MG tablet Commonly known as: PEPCID Take 1 tablet (20 mg total) by mouth daily. What changed: when to take this   ferrous sulfate 325 (65 FE) MG tablet Take 325 mg by mouth 2 (two) times daily with a meal.   furosemide 40 MG tablet Commonly known as: Lasix Take 2 tablets (80 mg total) by mouth daily. Take 2 tablets (80mg ) once daily in the morning. Can take an additional 1 tablet (40mg ) in the afternoon as needed for weight gain (3lbs in 1 day or 5lbs in 1 week).   glipiZIDE 10 MG tablet Commonly known as: GLUCOTROL Take 10 mg by mouth 2 (two) times daily.   latanoprost 0.005 % ophthalmic solution Commonly known as: XALATAN Place 1 drop into both eyes at bedtime.   levothyroxine 75 MCG tablet Commonly known as: SYNTHROID Take 75 mcg by mouth daily before breakfast.   nitroGLYCERIN 0.4 MG SL tablet Commonly known as: NITROSTAT Place 1 tablet (0.4 mg total) under the tongue every 5 (five) minutes x  3 doses as needed for chest pain.   OneTouch Ultra test strip Generic drug: glucose blood 1 each 2 (two) times daily.   polyethylene glycol 17 g packet Commonly known as: MiraLax Take 17 g by mouth daily as needed for moderate constipation.   potassium chloride SA 20 MEQ tablet Commonly known as: KLOR-CON Take 2 tablets (40 mEq total) by mouth daily. Start taking on: June 20, 2020   pyridoxine 100 MG tablet Commonly known as: B-6 Take 100 mg by mouth daily.   traMADol 50 MG tablet Commonly known as: Ultram Take 1 tablet (50 mg total) by mouth every 6 (six) hours as  needed.   vitamin C 1000 MG tablet Take 1,000 mg by mouth daily.   Vitamin D-3 125 MCG (5000 UT) Tabs Take 5,000 Units by mouth every evening.   vitamin E 180 MG (400 UNITS) capsule Take 400 Units by mouth daily.   zinc gluconate 50 MG tablet Take 50 mg by mouth daily.          Outstanding Labs/Studies   None  Duration of Discharge Encounter   Greater than 30 minutes including physician time.  Signed, Rosaria Ferries, PA-C 06/19/2020, 2:31 PM

## 2020-06-18 NOTE — Evaluation (Signed)
Physical Therapy Evaluation Patient Details Name: [name] MRN: [id number] DOB: 12/19/1927 Today's Date: 06/18/2020   History of Present Illness  87 y.o. male presents to [facility] ED on 06/17/2020 with reports of chest pain. Troponin elevated and small pleural effusion found in [facility] ED prior to transfer to [facility] on 6/4. CXR showed a small left pleural effusion. PMH of chronic diastolic CHF, paroxysmal atrial fibrillation (on Eliquis), presumed CAD (s/p NSTEMI in 09/2019 and medical management pursued with cath not performed given CKD), HTN, HLD, Type 2 DM, PAD (s/p bilateral BKA's), Stage 3-4 CKD and aortic stenosis  Clinical Impression  Pt presents to PT with deficits in balance, functional mobility, endurance, strength, power. Pt's deficits appear to be chronic in nature, requiring physical assistance for all bed mobility and transfers at baseline. Pt expresses goal to ambulate during PT session, PT provides education on possibly setting a short term goal of improving functional transfer and bed mobility quality prior to ambulation goal. Pt requires physical assistance to perform all bed mobility and transfer activities at this time. Pt fatigues quickly with sitting balance activities, but recovers when provided breaks. Pt will benefit from continued acute PT POC to provide further balance and mobility training in an effort to reduce caregiver burden and improve pt independence.    Follow Up Recommendations Home health PT;Supervision/Assistance - 24 hour    Equipment Recommendations  None recommended by PT    Recommendations for Other Services       Precautions / Restrictions Precautions Precautions: Fall Precaution Comments: bilateral BKA Restrictions Weight Bearing Restrictions: No Other Position/Activity Restrictions: bilateral BKA      Mobility  Bed Mobility Overal bed mobility: Needs Assistance Bed Mobility: Rolling;Supine to Sit;Sit to Supine Rolling: Min assist    Supine to sit: Max assist;+2 for physical assistance;HOB elevated Sit to supine: Total assist;+2 for physical assistance        Transfers Overall transfer level: Needs assistance Equipment used: 2 person hand held assist Transfers: Sit to/from Stand Sit to Stand: Mod assist;Max assist;+2 physical assistance;From elevated surface         General transfer comment: modA x2 for initial sit to stand, maxA x2 for following 2 attempts. bilateral knee block, tactile and verbal cues for anterior trunk lean and hip extension provided  Ambulation/Gait                Stairs            Wheelchair Mobility    Modified Rankin (Stroke Patients Only)       Balance Overall balance assessment: Needs assistance Sitting-balance support: Single extremity supported;Feet supported Sitting balance-Leahy Scale: Poor Sitting balance - Comments: minA with unilateral hand hold initially due to posterior or left sided lean, progresses to maxA with fatigue Postural control: Posterior lean;Left lateral lean Standing balance support: Bilateral upper extremity supported Standing balance-Leahy Scale: Poor Standing balance comment: mod-maxA x2                             Pertinent Vitals/Pain Pain Assessment: No/denies pain    Home Living Family/patient expects to be discharged to:: Private residence Living Arrangements: Children (3 children rotate for 24/7 assist) Available Help at Discharge: Family;Available 24 hours/day Type of Home: House Home Access: Ramped entrance     Home Layout: One level Home Equipment: Walker - 2 wheels;Bedside commode;Shower seat;Wheelchair - manual;Hospital bed      Prior Function Level of Independence:  Needs assistance   Gait / Transfers Assistance Needed: pt performs lift transfers with family assist, requires physical assistance for all functional mobility, has recently been working with outpatient therapy on transfers and ambulation with  physical assist  ADL's / Homemaking Assistance Needed: pt is able to feed, shave, bathe with setup, requires assistance for most ADLs        Hand Dominance   Dominant Hand: Right    Extremity/Trunk Assessment   Upper Extremity Assessment Upper Extremity Assessment: Generalized weakness    Lower Extremity Assessment Lower Extremity Assessment: Generalized weakness    Cervical / Trunk Assessment Cervical / Trunk Assessment: Kyphotic  Communication   Communication: HOH  Cognition Arousal/Alertness: Awake/alert Behavior During Therapy: WFL for tasks assessed/performed Overall Cognitive Status: Impaired/Different from baseline Area of Impairment: Problem solving                             Problem Solving: Slow processing        General Comments General comments (skin integrity, edema, etc.): VSS on RA    Exercises     Assessment/Plan    PT Assessment Patient needs continued PT services  PT Problem List Decreased strength;Decreased activity tolerance;Decreased balance;Decreased mobility       PT Treatment Interventions DME instruction;Gait training;Functional mobility training;Therapeutic activities;Therapeutic exercise;Balance training;Neuromuscular re-education;Patient/family education;Wheelchair mobility training    PT Goals (Current goals can be found in the Care Plan section)  Acute Rehab PT Goals Patient Stated Goal: to improve independence in transfers PT Goal Formulation: With patient/family Time For Goal Achievement: 07/02/20 Potential to Achieve Goals: Fair    Frequency Min 2X/week   Barriers to discharge        Co-evaluation               AM-PAC PT "6 Clicks" Mobility  Outcome Measure Help needed turning from your back to your side while in a flat bed without using bedrails?: A Little Help needed moving from lying on your back to sitting on the side of a flat bed without using bedrails?: A Lot Help needed moving to and from  a bed to a chair (including a wheelchair)?: A Lot Help needed standing up from a chair using your arms (e.g., wheelchair or bedside chair)?: A Lot Help needed to walk in hospital room?: Total Help needed climbing 3-5 steps with a railing? : Total 6 Click Score: 11    End of Session   Activity Tolerance: Patient tolerated treatment well Patient left: in bed;with call bell/phone within reach;with bed alarm set;with family/visitor present Nurse Communication: Mobility status;Need for lift equipment PT Visit Diagnosis: Other abnormalities of gait and mobility (R26.89);Muscle weakness (generalized) (M62.81)    Time: 1443-1540 PT Time Calculation (min) (ACUTE ONLY): 44 min   Charges:   PT Evaluation $PT Eval Low Complexity: 1 Low PT Treatments $Therapeutic Activity: 8-22 mins        Zenaida Niece, PT, DPT Acute Rehabilitation Pager: [phone]   Zenaida Niece 06/18/2020, 6:14 PM

## 2020-06-18 NOTE — Progress Notes (Signed)
  Echocardiogram 2D Echocardiogram has been performed.  [name] 06/18/2020, 9:37 AM

## 2020-06-18 NOTE — Progress Notes (Addendum)
Progress Note  Patient Name: [name] Date of Encounter: 06/18/2020  [facility] Cardiologist: Pixie Casino, MD   Subjective   No acute overnight events. Patient denies any chest pain, shortness of breath, or palpitations. He still is complaining of itching along is back intermittently.   We were initially going to discharge patient today. However, family has request we keep him another day to allow PT/OT to see him. Spoke with son [name] on the phone to get clarification on medications. Also called and spoke with Pharmacy. Pharmacy reports patient has Lasix 20mg  prescribed with instructions to take 1-3 tablets daily as needed at home. Son reports he usually takes 20mg  twice daily but recently has been taking 20mg  three times daily.  Inpatient Medications    Scheduled Meds: . apixaban  2.5 mg Oral BID  . atorvastatin  40 mg Oral QPM  . carvedilol  6.25 mg Oral BID WC  . Chlorhexidine Gluconate Cloth  6 each Topical Daily  . clopidogrel  75 mg Oral Q breakfast  . famotidine  20 mg Oral Daily  . ferrous sulfate  325 mg Oral BID WC  . latanoprost  1 drop Both Eyes QHS  . levothyroxine  75 mcg Oral QAC breakfast  . triamcinolone cream   Topical BID   Continuous Infusions:  PRN Meds: acetaminophen, nitroGLYCERIN, ondansetron (ZOFRAN) IV   Vital Signs    Vitals:   06/17/20 1554 06/17/20 2322 06/18/20 0356 06/18/20 0844  BP:  (!) 150/69 (!) 148/76 (!) 157/71  Pulse:  (!) 53 (!) 58 (!) 53  Resp: 16 19 18 18   Temp: (!) 97.3 F (36.3 C) 97.9 F (36.6 C) 97.6 F (36.4 C) (!) 97.3 F (36.3 C)  TempSrc: Oral Oral Oral Oral  SpO2:  96% 93% 95%  Weight:   95 kg   Height:        Intake/Output Summary (Last 24 hours) at 06/18/2020 1214 Last data filed at 06/18/2020 1100 Gross per 24 hour  Intake 360 ml  Output --  Net 360 ml   Last 3 Weights 06/18/2020 06/17/2020 03/28/2020  Weight (lbs) 209 lb 7 oz 220 lb 0.3 oz 220 lb  Weight (kg) 95 kg 99.8 kg 99.791 kg       Telemetry    Sinus rhythm with rates in the 50s to 60s with frequent PVCs (sometimes in bigeminy/trigeminy). - Personally Reviewed  ECG    Sinus bradycardia, rate 57 bpm, with 1st degree AV block and known RBBB as well as PVC. No acute ST/T changes.  - Personally Reviewed  Physical Exam   GEN: No acute distress.   Neck: Supple. No JVD Cardiac: Irregular rhythm with norma rate. III/VI systolic murmur loudest at upper sternal border. No rubs or gallops.  Respiratory: No increased work of breathing. Mildly decreased breath sounds in left base. No wheezes, rhonchi, or crackles. GI: Soft, non-tender, non-distended  MS: No edema. S/p bilaterally BKAs. Skin: Warm and dry. Neuro:  No focal deficits. Psych: Normal affect. Responds appropriately.  Labs    High Sensitivity Troponin:   Recent Labs  Lab 06/17/20 0313 06/17/20 0546 06/18/20 0318  TROPONINIHS 50* 519* 1,224*      Chemistry Recent Labs  Lab 06/17/20 0313 06/18/20 0318  NA 130* 132*  K 3.6 4.1  CL 95* 96*  CO2 26 22  GLUCOSE 207* 123*  BUN 43* 44*  CREATININE 2.87* 3.14*  CALCIUM 8.8* 8.8*  PROT 7.9  --   ALBUMIN 3.5  --  AST 22  --   ALT 18  --   ALKPHOS 55  --   BILITOT 0.8  --   GFRNONAA 20* 18*  ANIONGAP 9 14     Hematology Recent Labs  Lab 06/17/20 0313  WBC 13.2*  RBC 3.68*  HGB 11.9*  HCT 36.2*  MCV 98.4  MCH 32.3  MCHC 32.9  RDW 14.0  PLT 203    BNP Recent Labs  Lab 06/17/20 0313  BNP 412.0*     DDimer No results for input(s): DDIMER in the last 168 hours.   Radiology    DG Chest Portable 1 View  Result Date: 06/17/2020 CLINICAL DATA:  Shortness of breath. Congestive heart failure prior myocardial infarction. EXAM: PORTABLE CHEST 1 VIEW COMPARISON:  Chest x-ray 02/24/2020, chest x-ray 12/08/2019 FINDINGS: The heart size and mediastinal contours are unchanged. Aortic calcifications. No focal consolidation. Redemonstration of coarsened interstitial markings with no overt  pulmonary edema. At least small volume left pleural effusion. No pneumothorax. No acute osseous abnormality. Degenerative changes of bilateral shoulders. IMPRESSION: At least small volume left pleural effusion. Electronically Signed   By: Iven Finn M.D.   On: 06/17/2020 04:20   ECHOCARDIOGRAM COMPLETE  Result Date: 06/18/2020    ECHOCARDIOGRAM REPORT   Patient Name:   [name] Date of Exam: 06/18/2020 Medical Rec #:  [id number]      Height:       68.0 in Accession #:    [id number]     Weight:       209.4 lb Date of Birth:  10-26-1927      BSA:          2.084 m Patient Age:    87 years       BP:           148/76 mmHg Patient Gender: M              HR:           54 bpm. Exam Location:  Inpatient Procedure: 2D Echo Indications:    chest pain  History:        Patient has no prior history of Echocardiogram examinations,                 most recent 08/26/2019. CHF, CAD, chronic kidney disease, Aortic                 Valve Disease, Arrythmias:RBBB; Risk Factors:Diabetes,                 Hypertension and Dyslipidemia.  Sonographer:    Johny Chess Referring Phys: [id number] [place]  1. Left ventricular ejection fraction, by estimation, is 65 to 70%. The left ventricle has normal function. The left ventricle has no regional wall motion abnormalities. There is mild concentric left ventricular hypertrophy. Left ventricular diastolic parameters are consistent with Grade II diastolic dysfunction (pseudonormalization). Elevated left atrial pressure.  2. Right ventricular systolic function is normal. The right ventricular size is normal. There is mildly elevated pulmonary artery systolic pressure.  3. Left atrial size was mildly dilated.  4. The mitral valve is normal in structure. Mild mitral valve regurgitation. No evidence of mitral stenosis.  5. The aortic valve is tricuspid. There is severe calcifcation of the aortic valve. There is severe thickening of the aortic valve. Aortic valve  regurgitation is mild. Moderate to severe aortic valve stenosis.  6. The inferior vena cava is dilated in size with >50% respiratory variability, suggesting  right atrial pressure of 8 mmHg. Comparison(s): Prior images reviewed side by side. The left ventricular function is unchanged. The left ventricular diastolic function is significantly worse. The left ventricular wall motion unchanged. FINDINGS  Left Ventricle: Left ventricular ejection fraction, by estimation, is 65 to 70%. The left ventricle has normal function. The left ventricle has no regional wall motion abnormalities. The left ventricular internal cavity size was normal in size. There is  mild concentric left ventricular hypertrophy. Left ventricular diastolic parameters are consistent with Grade II diastolic dysfunction (pseudonormalization). Elevated left atrial pressure. Right Ventricle: The right ventricular size is normal. No increase in right ventricular wall thickness. Right ventricular systolic function is normal. There is mildly elevated pulmonary artery systolic pressure. The tricuspid regurgitant velocity is 2.90  m/s, and with an assumed right atrial pressure of 8 mmHg, the estimated right ventricular systolic pressure is 34.7 mmHg. Left Atrium: Left atrial size was mildly dilated. Right Atrium: Right atrial size was normal in size. Pericardium: There is no evidence of pericardial effusion. Mitral Valve: The mitral valve is normal in structure. Mild to moderate mitral annular calcification. Mild mitral valve regurgitation. No evidence of mitral valve stenosis. Tricuspid Valve: The tricuspid valve is normal in structure. Tricuspid valve regurgitation is trivial. Aortic Valve: The aortic valve is tricuspid. There is severe calcifcation of the aortic valve. There is severe thickening of the aortic valve. Aortic valve regurgitation is mild. Moderate to severe aortic stenosis is present. Aortic valve mean gradient measures 26.5 mmHg. Aortic valve  peak gradient measures 48.2 mmHg. Aortic valve area, by VTI measures 0.83 cm. Pulmonic Valve: The pulmonic valve was grossly normal. Pulmonic valve regurgitation is not visualized. Aorta: The aortic root is normal in size and structure. Venous: The inferior vena cava is dilated in size with greater than 50% respiratory variability, suggesting right atrial pressure of 8 mmHg. IAS/Shunts: No atrial level shunt detected by color flow Doppler.  LEFT VENTRICLE PLAX 2D LVIDd:         4.60 cm  Diastology LVIDs:         2.80 cm  LV e' medial:    4.46 cm/s LV PW:         1.20 cm  LV E/e' medial:  20.1 LV IVS:        1.20 cm  LV e' lateral:   4.57 cm/s LVOT diam:     2.00 cm  LV E/e' lateral: 19.6 LV SV:         74 LV SV Index:   35 LVOT Area:     3.14 cm  RIGHT VENTRICLE            IVC RV S prime:     9.79 cm/s  IVC diam: 2.10 cm TAPSE (M-mode): 1.7 cm LEFT ATRIUM             Index       RIGHT ATRIUM           Index LA diam:        4.20 cm 2.02 cm/m  RA Area:     14.00 cm LA Vol (A2C):   53.8 ml 25.81 ml/m RA Volume:   36.50 ml  17.51 ml/m LA Vol (A4C):   60.6 ml 29.08 ml/m LA Biplane Vol: 59.3 ml 28.45 ml/m  AORTIC VALVE AV Area (Vmax):    0.76 cm AV Area (Vmean):   0.77 cm AV Area (VTI):     0.83 cm AV Vmax:  347.00 cm/s AV Vmean:          240.000 cm/s AV VTI:            0.885 m AV Peak Grad:      48.2 mmHg AV Mean Grad:      26.5 mmHg LVOT Vmax:         83.75 cm/s LVOT Vmean:        58.700 cm/s LVOT VTI:          0.235 m LVOT/AV VTI ratio: 0.27  AORTA Ao Root diam: 3.70 cm MITRAL VALVE               TRICUSPID VALVE MV Area (PHT): 1.97 cm    TR Peak grad:   33.8 mmHg MV Decel Time: 385 msec    TR Vmax:        290.48 cm/s MV E velocity: 89.50 cm/s MV A velocity: 81.00 cm/s  SHUNTS MV E/A ratio:  1.10        Systemic VTI:  0.24 m                            Systemic Diam: 2.00 cm Sanda Klein MD Electronically signed by Sanda Klein MD Signature Date/Time: 06/18/2020/11:19:57 AM    Final     Cardiac  Studies   Echocardiogram 06/18/2020: 1. Left ventricular ejection fraction, by estimation, is 65 to 70%. The  left ventricle has normal function. The left ventricle has no regional  wall motion abnormalities. There is mild concentric left ventricular  hypertrophy. Left ventricular diastolic  parameters are consistent with Grade II diastolic dysfunction  (pseudonormalization). Elevated left atrial pressure.  2. Right ventricular systolic function is normal. The right ventricular  size is normal. There is mildly elevated pulmonary artery systolic  pressure.  3. Left atrial size was mildly dilated.  4. The mitral valve is normal in structure. Mild mitral valve  regurgitation. No evidence of mitral stenosis.  5. The aortic valve is tricuspid. There is severe calcifcation of the  aortic valve. There is severe thickening of the aortic valve. Aortic valve  regurgitation is mild. Moderate to severe aortic valve stenosis.  6. The inferior vena cava is dilated in size with >50% respiratory  variability, suggesting right atrial pressure of 8 mmHg.   Comparison(s): Prior images reviewed side by side. The left ventricular  function is unchanged. The left ventricular diastolic function is  significantly worse. The left ventricular wall motion unchanged.    Patient Profile     87 y.o. male with a history of presumed CAD with NSTEMI in 09/2019 which was medically treated given CKD, chronic diastolic CHF, paroxysmal atrial fibrillation on Eliquis, moderate to severe aortic stenosis on Echo in 08/2019, PAD s/p bilateral BKAs, hypertension, hyperlipidemia, type 2 diabetes mellitus, CKD stage III-IV who was transferred from [facility] for further evaluation of chest pain.  Assessment & Plan    Chest Pain Demand Ischemia - Chest pain presented with reports of chest pain. On talking with son, he was complaining of "indigestion" that did not relieve with TUMS. No complaints of chest pain here. -  High-sensitivity troponin elevated at 50 >> 519 >> 1,224.  - EKG shows normal sinus rhythm with known RBBB.  - Echo shows LVEF of 65-70% with normal wall motion and grade 2 diastolic dysfunction.  - Possible that he had a small MI but felt to more likely be demand ischemia in setting of CHF  and moderate/severe AS. Will continue to treat medically. Continue Plavix, beta-blocker, and statin.  Possible Mild Acute on Chronic Diastolic CHF - Family members reports some dyspnea as well as abdominal distension. - Chest x-ray showed small left pleural effusion.  - BNP elevated at 412. He received a 2 doses of IV Lasix 40mg  with 1.25 L of output.  - Echo normal LV function with elevated left atrial pressure.  - Will give another dose of IV Lasix 40mg  today and then plan to discharge patient on Lasix 80mg  once daily at home with an additional 40mg  in the afternoon as needed for weight gain. - Continue to monitor daily, strict I/O's, and renal function.   Aortic Stenosis - Moderate to severe AS noted on Echo in 08/2019. - Echo this admission showed continued moderate to severe AS with mean gradient of 26.5 mmHG and peak gradient of 48.2 mmHg. - He has not been felt to be a TAVR candidate due to multiple comorbidities and CKD.   Paroxysmal Atrial Fibrillation - Maintained sinus rhythm with frequent PVCs (often in bigeminy or trigeminy). - Continue home Coreg 6/25mg  twice daily.  - Continue Eliquis 2.5mg  twice day (reduced dose due to kidney function and age).  PAD s/p Bilateral BKAs - Patient is mostly bedbound these days but is able to get around in a wheelchair some. - Will consult PT/OT for home recommend.   Hypertension - BP elevated with systolic BP often in the 009F to 160s.  - Continue Coreg 6.25mg  twice daily.  - Will restart home Amlodipine 10mg  daily.  Hyperlipidemia Continue home Lipitor 40mg  daily.  Type 2 Diabetes Mellitus - Continue sliding scale insulin.  - Can restart  home medications at discharge.  CKD Stage IV - Creatinine 2.87 on admission and 3.14 today.  Baseline 2.5 to 2.6.  - Nitrofurantoin listed under PTA medications. This is contraindicated in patients with CrCl < 30 mL/min. Spoke with son who stated he is no longer on this.  - Recommend following up with Nephrologist (Dr. Theador Hawthorne) after discharge.   Recurrent UTI - Keflex and Nitrofuantoin listed under PTA medications as prophyalxis for UTI. However, patient no longer on either one of these. He is no on Cipro 250mg  daily. Will update med list. Can continue at discharge.   For questions or updates, please contact [place] Please consult [email or web address] for contact info under        Signed, Darreld Mclean, PA-C  06/18/2020, 12:14 PM    I have seen and examined the patient along with Darreld Mclean, PA-C .  I have reviewed the chart, notes and new data.  I agree with PA/NP's note.  Key new complaints: Denies angina or dyspnea.  No flank pain today. Key examination changes: On clinical exam he appears to be euvolemic with clear lungs and without obvious JVD.  Mid-to-late peaking aortic ejection murmur with a very faint S2.  Status post bilateral below the knee amputations. Key new findings / data: Echo reviewed and shows normal LVEF and no evidence of regional wall motion abnormalities, but has evidence of grade 2 diastolic dysfunction (pseudo normal mitral inflow with elevated left atrial filling pressures) which is a new finding compared to last year.  PLAN: After talking with the patient's son it seems that his initial presentation was actually paroxysmal nocturnal dyspnea, with some associated chest discomfort. Small increase in troponin may indicate demand infarction. His other son told us that the patient has been taking varying doses of loop diuretic  at home, between 20 and 60 mg a day.  We will give him another dose of IV diuretic today and plan to discharge on furosemide 80 mg  once daily. Renal function has worsened, but it will be very difficult to maintain the balance between worsening renal insufficiency and worsening congestive heart failure. Mr. Winkels has advanced/terminal aortic valve stenosis and has been deemed to be a poor candidate for either surgical AVR or TAVR. A palliative care approach with focus on symptom control by using diuretics is the best option at this point. Finding a way to weigh him at home and adjust diuretics based on fluid gain would be helpful, but this will be a major challenge since he has limited mobility from his amputations.  Sanda Klein, MD, [place] [phone] 06/18/2020, 12:58 PM

## 2020-06-19 ENCOUNTER — Encounter: Payer: Medicare Other | Admitting: Physical Therapy

## 2020-06-19 ENCOUNTER — Encounter: Payer: Self-pay | Admitting: Physical Therapy

## 2020-06-19 DIAGNOSIS — I10 Essential (primary) hypertension: Secondary | ICD-10-CM

## 2020-06-19 LAB — BASIC METABOLIC PANEL
Anion gap: 10 (ref 5–15)
BUN: 42 mg/dL — ABNORMAL HIGH (ref 8–23)
CO2: 27 mmol/L (ref 22–32)
Calcium: 9.1 mg/dL (ref 8.9–10.3)
Chloride: 98 mmol/L (ref 98–111)
Creatinine, Ser: 3.16 mg/dL — ABNORMAL HIGH (ref 0.61–1.24)
GFR, Estimated: 18 mL/min — ABNORMAL LOW (ref >60)
Glucose, Bld: 128 mg/dL — ABNORMAL HIGH (ref 70–99)
Potassium: 3.3 mmol/L — ABNORMAL LOW (ref 3.5–5.1)
Sodium: 135 mmol/L (ref 135–145)

## 2020-06-19 LAB — GLUCOSE, CAPILLARY: Glucose-Capillary: 134 mg/dL — ABNORMAL HIGH (ref 70–99)

## 2020-06-19 MED ORDER — DOXAZOSIN MESYLATE 2 MG PO TABS: 2.0000 mg | ORAL_TABLET | Freq: Every day | ORAL | 3 refills | Status: DC

## 2020-06-19 MED ORDER — FUROSEMIDE 40 MG PO TABS: 80.0000 mg | ORAL_TABLET | Freq: Every day | ORAL | 3 refills | Status: DC

## 2020-06-19 MED ORDER — NITROGLYCERIN 0.4 MG SL SUBL: 0.4000 mg | SUBLINGUAL_TABLET | SUBLINGUAL | 3 refills | Status: DC | PRN

## 2020-06-19 MED ORDER — FUROSEMIDE 40 MG PO TABS: 80.0000 mg | ORAL_TABLET | Freq: Every day | ORAL | 2 refills | Status: DC

## 2020-06-19 MED ORDER — FUROSEMIDE 80 MG PO TABS: 80.0000 mg | ORAL_TABLET | Freq: Every day | ORAL | Status: DC

## 2020-06-19 MED ORDER — CARVEDILOL 3.125 MG PO TABS: 3.1250 mg | ORAL_TABLET | Freq: Two times a day (BID) | ORAL | Status: DC

## 2020-06-19 MED ORDER — POTASSIUM CHLORIDE CRYS ER 20 MEQ PO TBCR: 40.0000 meq | EXTENDED_RELEASE_TABLET | Freq: Every day | ORAL | Status: DC

## 2020-06-19 MED ORDER — APIXABAN 2.5 MG PO TABS: 2.5000 mg | ORAL_TABLET | Freq: Two times a day (BID) | ORAL | 2 refills | Status: DC

## 2020-06-19 MED ORDER — DOXAZOSIN MESYLATE 2 MG PO TABS
2.0000 mg | ORAL_TABLET | Freq: Every day | ORAL | Status: DC
  Filled 2020-06-19 (×2): qty 1

## 2020-06-19 MED ORDER — CARVEDILOL 3.125 MG PO TABS: 3.1250 mg | ORAL_TABLET | Freq: Two times a day (BID) | ORAL | 3 refills | Status: DC

## 2020-06-19 MED ORDER — POTASSIUM CHLORIDE CRYS ER 20 MEQ PO TBCR: 40.0000 meq | EXTENDED_RELEASE_TABLET | Freq: Every day | ORAL | 3 refills | Status: DC

## 2020-06-19 NOTE — Consult Note (Signed)
   [facility] Inpatient Consult   06/19/2020  [name] Sep 02, 1927 [id number]   Patient is currently active with [facility] for chronic disease management services.  Patient has been engaged by a [facility].   Plan: Will continue to follow for progression and update [facility] CM care manager of patient disposition.  Of note, [facility] Care Management services does not replace or interfere with any services that are arranged by inpatient case management or social work.  Netta Cedars, MSN, [facility] Liaison Nurse Mobile Phone [phone]  Toll free office [phone]

## 2020-06-19 NOTE — TOC Transition Note (Addendum)
Transition of Care (TOC) - CM/SW Discharge Note Marvetta Gibbons RN, BSN Transitions of Care Unit 4E- RN Case Manager See Treatment Team for direct phone # Cross coverage for [place]   Patient Details  Name: [name] MRN: [id number] Date of Birth: 1927/08/21  Transition of Care [facility]) CM/SW Contact:  Dawayne Patricia, RN Phone Number: 06/19/2020, 3:10 PM   Clinical Narrative:    Pt stable for transition home with family, order has been placed for [facility] - , (family would like to hold off on HHPT for now), CM went to room to speak with family, son at the bedside. List provided for [facility] choice Per CMS guidelines from medicare.gov website with star ratings (copy placed in shadow chart). Per son they have used [facility] in the past post recently and would prefer to use them again if possible. Son reports that they have all needed DME at home and no new DME needs at this time. Family to transport home.   Address, phone #s and PCP all confirmed in epic.   Call made to [facility] Fedra Lanter with [facility] for [facility] referral- referral pending at this time.  1518- received notification from [facility] that referral has been accepted.    Final next level of care: [place] Barriers to Discharge: No Barriers Identified   Patient Goals and CMS Choice Patient states their goals for this hospitalization and ongoing recovery are:: return home CMS Medicare.gov Compare Post Acute Care list provided to:: Patient Choice offered to / list presented to : Adult Children  Discharge Placement               Home with [facility]        Discharge Plan and Services   Discharge Planning Services: CM Consult Post Acute Care Choice: Home Health          DME Arranged: N/A DME Agency: NA       HH Arranged: RN,Disease Management [place] Agency: [place] ([facility]) Date [place]: 06/19/20 Time [place]: 1509 Representative spoke with at [place]: [place] ([place])  Interventions     Readmission Risk Interventions Readmission Risk Prevention Plan 06/19/2020 02/25/2020 12/13/2019  Transportation Screening Complete Complete -  PCP or Specialist Appt within 5-7 Days Complete - -  PCP or Specialist Appt within 3-5 Days - - -  Not Complete comments - - -  Home Care Screening Complete - -  Medication Review (RN CM) Complete - -  HRI or [place] - Complete -  [place] or Home Care Consult comments - - -  Social Work Consult for [place] Planning/Counseling - Complete -  Palliative Care Screening - Complete -  Medication Review Press photographer) - Complete -  PCP or Specialist appointment within 3-5 days of discharge - - Complete  HRI or [place] recent data might be hidden

## 2020-06-19 NOTE — Therapy (Signed)
[facility] Physical Therapy [address] [place], [place], [address] Phone: [phone]   Fax:  [phone]  Patient Details  Name: [name] MRN: [id number] Date of Birth: July 11, 1927 Referring Provider:  [name], MD  Encounter Date: 06/19/2020  PHYSICAL THERAPY DISCHARGE SUMMARY  Visits from Start of Care: 6  Current functional level related to goals / functional outcomes: Limited progress since evaluation as patient physiological response to activities has limited PT.    Remaining deficits: Patient was hospitalized with possible STEMI.  Also his family has decided that PT may too much for him at this time.    Education / Equipment: Updated HEP  Plan: Patient agrees to discharge.  Patient goals were not met. Patient is being discharged due to a change in medical status.  ?????          Jamey Reas, PT, DPT 06/19/2020, 12:23 PM  [facility] Physical Therapy [address] [place], [place], [address] Phone: [phone]   Fax:  [phone]

## 2020-06-19 NOTE — Discharge Instructions (Signed)
Please ask any family members helping the Mr Pesola's care to learn CPR.  WEIGH DAILY, every am, wearing the same amount of clothing Record weights, and bring it to office appointments Contact Pixie Casino, MD for weight gain of 3 lbs in a day or 5 lbs in a week Limit sodium to 500 mg per meal, total 2000 mg per day Limit all liquids to 1.5-2 liters/quarts per day    Heart Failure Education: 1. Weigh yourself EVERY morning after you go to the bathroom but before you eat or drink anything. Write this number down in a weight log/diary. If you gain 3 pounds overnight or 5 pounds in a week, call the office. 2. Take your medicines as prescribed. If you have concerns about your medications, please call us before you stop taking them.  3. Eat low salt foods--Limit salt (sodium) to 2000 mg per day. This will help prevent your body from holding onto fluid. Read food labels as many processed foods have a lot of sodium, especially canned goods and prepackaged meats. If you would like some assistance choosing low sodium foods, we would be happy to set you up with a nutritionist. 4. Limit all fluids for the day to less than 2 liters (64 ounces). Fluid includes all drinks, coffee, juice, ice chips, soup, jello, and all other liquids. 5. Stay as active as you can everyday. Staying active will give you more energy and make your muscles stronger. Start with 5 minutes at a time and work your way up to 30 minutes a day. Break up your activities--do some in the morning and some in the afternoon. Start with 3 days per week and work your way up to 5 days as you can.  If you have chest pain, feel short of breath, dizzy, or lightheaded, STOP. If you don't feel better after a short rest, call 911. If you do feel better, call the office to let us know you have symptoms with exercise.

## 2020-06-19 NOTE — Progress Notes (Signed)
Progress Note  Patient Name: [name] Date of Encounter: 06/19/2020  [facility] HeartCare Cardiologist: Pixie Casino, MD   Subjective   06/06  Pt c/o being sleepy, denies CP or SOB O2 sats 98% on room air  pta on Lasix 20 mg, pt taking bid>> tid   Inpatient Medications    Scheduled Meds: . amLODipine  10 mg Oral Daily  . apixaban  2.5 mg Oral BID  . atorvastatin  40 mg Oral QPM  . carvedilol  6.25 mg Oral BID WC  . Chlorhexidine Gluconate Cloth  6 each Topical Daily  . clopidogrel  75 mg Oral Q breakfast  . famotidine  20 mg Oral Daily  . ferrous sulfate  325 mg Oral BID WC  . latanoprost  1 drop Both Eyes QHS  . levothyroxine  75 mcg Oral QAC breakfast  . triamcinolone cream   Topical BID   Continuous Infusions:  PRN Meds: acetaminophen, nitroGLYCERIN, ondansetron (ZOFRAN) IV, polyethylene glycol   Vital Signs    Vitals:   06/18/20 1441 06/18/20 1715 06/18/20 2029 06/19/20 0423  BP: (!) 160/65 (!) 154/63 (!) 152/60 (!) 164/61  Pulse: (!) 51 (!) 55 (!) 51 (!) 53  Resp: (!) 21 18 20 17   Temp:   (!) 97.5 F (36.4 C) 98 F (36.7 C)  TempSrc:   Oral Oral  SpO2: 96% 94% 96% 95%  Weight:    94 kg  Height:        Intake/Output Summary (Last 24 hours) at 06/19/2020 0658 Last data filed at 06/19/2020 0431 Gross per 24 hour  Intake 620 ml  Output 1600 ml  Net -980 ml   Last 3 Weights 06/19/2020 06/18/2020 06/17/2020  Weight (lbs) 207 lb 3.7 oz 209 lb 7 oz 220 lb 0.3 oz  Weight (kg) 94 kg 95 kg 99.8 kg      Telemetry    SB high 40s, PVCs and vent bigeminy - Personally Reviewed  ECG    None today  - Personally Reviewed  Physical Exam   General: Well developed, well nourished, male in no acute distress Head: Eyes PERRLA, Head normocephalic and atraumatic Lungs: clear bilaterally to auscultation. Heart: HRRR S1 S2, without rub or gallop. 2/6 murmur. 4/4 extremity pulses are 2+ & equal. No JVD. Abdomen: Bowel sounds are present, abdomen soft and non-tender  without masses or  hernias noted. Msk: weak strength and tone for age. Extremities: No clubbing, cyanosis or edema. S/p bilat BKA Skin:  No rashes or lesions noted. Neuro: Alert and oriented X 3. Psych:  Good affect, responds appropriately  Labs    High Sensitivity Troponin:   Recent Labs  Lab 06/17/20 0313 06/17/20 0546 06/18/20 0318  TROPONINIHS 50* 519* 1,224*      Chemistry Recent Labs  Lab 06/17/20 0313 06/18/20 0318  NA 130* 132*  K 3.6 4.1  CL 95* 96*  CO2 26 22  GLUCOSE 207* 123*  BUN 43* 44*  CREATININE 2.87* 3.14*  CALCIUM 8.8* 8.8*  PROT 7.9  --   ALBUMIN 3.5  --   AST 22  --   ALT 18  --   ALKPHOS 55  --   BILITOT 0.8  --   GFRNONAA 20* 18*  ANIONGAP 9 14     Hematology Recent Labs  Lab 06/17/20 0313  WBC 13.2*  RBC 3.68*  HGB 11.9*  HCT 36.2*  MCV 98.4  MCH 32.3  MCHC 32.9  RDW 14.0  PLT 203  BNP Recent Labs  Lab 06/17/20 0313  BNP 412.0*    Lab Results  Component Value Date   CHOL 80 02/03/2019   HDL 26 (L) 02/03/2019   LDLCALC 37 02/03/2019   TRIG 85 02/03/2019   CHOLHDL 3.1 02/03/2019    Radiology    DG Chest Portable 1 View  Result Date: 06/17/2020 CLINICAL DATA:  Shortness of breath. Congestive heart failure prior myocardial infarction. EXAM: PORTABLE CHEST 1 VIEW COMPARISON:  Chest x-ray 02/24/2020, chest x-ray 12/08/2019 FINDINGS: The heart size and mediastinal contours are unchanged. Aortic calcifications. No focal consolidation. Redemonstration of coarsened interstitial markings with no overt pulmonary edema. At least small volume left pleural effusion. No pneumothorax. No acute osseous abnormality. Degenerative changes of bilateral shoulders. IMPRESSION: At least small volume left pleural effusion. Electronically Signed   By: Iven Finn M.D.   On: 06/17/2020 04:20    ECHOCARDIOGRAM COMPLETE  Result Date: 06/18/2020    ECHOCARDIOGRAM REPORT   Patient Name:   [name] Date of Exam: 06/18/2020 Medical Rec #:   [id number]      Height:       68.0 in Accession #:    [id number]     Weight:       209.4 lb Date of Birth:  10-02-1927      BSA:          2.084 m Patient Age:    87 years       BP:           148/76 mmHg Patient Gender: M              HR:           54 bpm. Exam Location:  Inpatient Procedure: 2D Echo Indications:    chest pain  History:        Patient has no prior history of Echocardiogram examinations,                 most recent 08/26/2019. CHF, CAD, chronic kidney disease, Aortic                 Valve Disease, Arrythmias:RBBB; Risk Factors:Diabetes,                 Hypertension and Dyslipidemia.  Sonographer:    Johny Chess Referring Phys: [id number] [place]  1. Left ventricular ejection fraction, by estimation, is 65 to 70%. The left ventricle has normal function. The left ventricle has no regional wall motion abnormalities. There is mild concentric left ventricular hypertrophy. Left ventricular diastolic parameters are consistent with Grade II diastolic dysfunction (pseudonormalization). Elevated left atrial pressure.  2. Right ventricular systolic function is normal. The right ventricular size is normal. There is mildly elevated pulmonary artery systolic pressure.  3. Left atrial size was mildly dilated.  4. The mitral valve is normal in structure. Mild mitral valve regurgitation. No evidence of mitral stenosis.  5. The aortic valve is tricuspid. There is severe calcifcation of the aortic valve. There is severe thickening of the aortic valve. Aortic valve regurgitation is mild. Moderate to severe aortic valve stenosis.  6. The inferior vena cava is dilated in size with >50% respiratory variability, suggesting right atrial pressure of 8 mmHg. Comparison(s): Prior images reviewed side by side. The left ventricular function is unchanged. The left ventricular diastolic function is significantly worse. The left ventricular wall motion unchanged. FINDINGS  Left Ventricle: Left ventricular  ejection fraction, by estimation, is 65 to 70%. The left ventricle  has normal function. The left ventricle has no regional wall motion abnormalities. The left ventricular internal cavity size was normal in size. There is  mild concentric left ventricular hypertrophy. Left ventricular diastolic parameters are consistent with Grade II diastolic dysfunction (pseudonormalization). Elevated left atrial pressure. Right Ventricle: The right ventricular size is normal. No increase in right ventricular wall thickness. Right ventricular systolic function is normal. There is mildly elevated pulmonary artery systolic pressure. The tricuspid regurgitant velocity is 2.90  m/s, and with an assumed right atrial pressure of 8 mmHg, the estimated right ventricular systolic pressure is 74.9 mmHg. Left Atrium: Left atrial size was mildly dilated. Right Atrium: Right atrial size was normal in size. Pericardium: There is no evidence of pericardial effusion. Mitral Valve: The mitral valve is normal in structure. Mild to moderate mitral annular calcification. Mild mitral valve regurgitation. No evidence of mitral valve stenosis. Tricuspid Valve: The tricuspid valve is normal in structure. Tricuspid valve regurgitation is trivial. Aortic Valve: The aortic valve is tricuspid. There is severe calcifcation of the aortic valve. There is severe thickening of the aortic valve. Aortic valve regurgitation is mild. Moderate to severe aortic stenosis is present. Aortic valve mean gradient measures 26.5 mmHg. Aortic valve peak gradient measures 48.2 mmHg. Aortic valve area, by VTI measures 0.83 cm. Pulmonic Valve: The pulmonic valve was grossly normal. Pulmonic valve regurgitation is not visualized. Aorta: The aortic root is normal in size and structure. Venous: The inferior vena cava is dilated in size with greater than 50% respiratory variability, suggesting right atrial pressure of 8 mmHg. IAS/Shunts: No atrial level shunt detected by color flow  Doppler.  LEFT VENTRICLE PLAX 2D LVIDd:         4.60 cm  Diastology LVIDs:         2.80 cm  LV e' medial:    4.46 cm/s LV PW:         1.20 cm  LV E/e' medial:  20.1 LV IVS:        1.20 cm  LV e' lateral:   4.57 cm/s LVOT diam:     2.00 cm  LV E/e' lateral: 19.6 LV SV:         74 LV SV Index:   35 LVOT Area:     3.14 cm  RIGHT VENTRICLE            IVC RV S prime:     9.79 cm/s  IVC diam: 2.10 cm TAPSE (M-mode): 1.7 cm LEFT ATRIUM             Index       RIGHT ATRIUM           Index LA diam:        4.20 cm 2.02 cm/m  RA Area:     14.00 cm LA Vol (A2C):   53.8 ml 25.81 ml/m RA Volume:   36.50 ml  17.51 ml/m LA Vol (A4C):   60.6 ml 29.08 ml/m LA Biplane Vol: 59.3 ml 28.45 ml/m  AORTIC VALVE AV Area (Vmax):    0.76 cm AV Area (Vmean):   0.77 cm AV Area (VTI):     0.83 cm AV Vmax:           347.00 cm/s AV Vmean:          240.000 cm/s AV VTI:            0.885 m AV Peak Grad:      48.2 mmHg AV Mean Grad:  26.5 mmHg LVOT Vmax:         83.75 cm/s LVOT Vmean:        58.700 cm/s LVOT VTI:          0.235 m LVOT/AV VTI ratio: 0.27  AORTA Ao Root diam: 3.70 cm MITRAL VALVE               TRICUSPID VALVE MV Area (PHT): 1.97 cm    TR Peak grad:   33.8 mmHg MV Decel Time: 385 msec    TR Vmax:        290.48 cm/s MV E velocity: 89.50 cm/s MV A velocity: 81.00 cm/s  SHUNTS MV E/A ratio:  1.10        Systemic VTI:  0.24 m                            Systemic Diam: 2.00 cm Sanda Klein MD Electronically signed by Sanda Klein MD Signature Date/Time: 06/18/2020/11:19:57 AM    Final     Cardiac Studies   Echocardiogram 06/18/2020: 1. Left ventricular ejection fraction, by estimation, is 65 to 70%. The  left ventricle has normal function. The left ventricle has no regional  wall motion abnormalities. There is mild concentric left ventricular  hypertrophy. Left ventricular diastolic  parameters are consistent with Grade II diastolic dysfunction  (pseudonormalization). Elevated left atrial pressure.  2. Right  ventricular systolic function is normal. The right ventricular  size is normal. There is mildly elevated pulmonary artery systolic  pressure.  3. Left atrial size was mildly dilated.  4. The mitral valve is normal in structure. Mild mitral valve  regurgitation. No evidence of mitral stenosis.  5. The aortic valve is tricuspid. There is severe calcifcation of the  aortic valve. There is severe thickening of the aortic valve. Aortic valve  regurgitation is mild. Moderate to severe aortic valve stenosis.  6. The inferior vena cava is dilated in size with >50% respiratory  variability, suggesting right atrial pressure of 8 mmHg.   Comparison(s): Prior images reviewed side by side. The left ventricular  function is unchanged. The left ventricular diastolic function is  significantly worse. The left ventricular wall motion unchanged.    Patient Profile     87 y.o. male with a history of presumed CAD with NSTEMI in 09/2019 which was medically treated given CKD, chronic diastolic CHF, paroxysmal atrial fibrillation on Eliquis, moderate to severe aortic stenosis on Echo in 08/2019, PAD s/p bilateral BKAs, hypertension, hyperlipidemia, type 2 diabetes mellitus, CKD stage III-IV who was transferred from [facility] for further evaluation of chest pain.  Assessment & Plan    Chest Pain Demand Ischemia -CP prior to admit rx w/ Tums, no help - peak trop 1,224 - RBBB is old - EF nl w/ grade 2 dd - felt to be demand ischemia 2nd CHF, AS - Med rx, no cath planned - on Plavix, BB, statin  Acute on Chronic Diastolic CHF - BNP elevated on admit - small L effusion on CXR - s/p Lasix 40 mg IV, total 3 doses - sats now 98% on room air, no SOB - Plan is for d/c on Lasix 80 mg qd w/ 40 mg pm prn wt gain - renal function worsened w/ diuresis  Aortic Stenosis - AS mod-severe w/ mean grad 26.5 and peak 48.2 - not AVR/TAVR due to multiple comorbidities and CKD - continue to monitor volume status  carefully  Paroxysmal Atrial Fibrillation -  baseline SR, sinus brady high 40s, especially while asleep - on Coreg 6.25 mg bid and Eliquis 2.5 mg bid (dose adjusted due to age and decreased renal function)  PAD s/p Bilateral BKAs - PT/OT has seen - requires 1-2 person assist to move - also fatigues very quickly - pt rec HHPT, 24 hr supervision    Hypertension - pta on amlodipine 10 mg qd, initially held but restarted - Coreg 6.25 mg bid is new, HR high 40s at times - MD advise on continuing BB at current dose  Hyperlipidemia - on Lipitor 40 mg qd from home  Type 2 Diabetes Mellitus - on glucotrol 10 mg qd pta - now on SSI - restart home rx at d/c  CKD Stage IV - Cr 2.87 on admit, now 3.16 - some increase w/ diuresis - f/u with Dr Theador Hawthorne ([place]) after d/c - make sure no nephrotoxic meds at d/c  Recurrent UTI - no longer on Keflex or nitrofurantoin - currently on Cipro 250 mg qd preventive - restart at d/c  Plan: pt stable from a cardiac standpoint and no further eval indicated D/c home today.  For questions or updates, please contact [place] Please consult [email or web address] for contact info under        Signed, Rosaria Ferries, PA-C  06/19/2020, 6:58 AM

## 2020-06-20 ENCOUNTER — Ambulatory Visit (INDEPENDENT_AMBULATORY_CARE_PROVIDER_SITE_OTHER): Payer: Medicare Other

## 2020-06-20 ENCOUNTER — Other Ambulatory Visit: Payer: Medicare Other | Admitting: Nurse Practitioner

## 2020-06-20 ENCOUNTER — Telehealth: Payer: Self-pay | Admitting: Internal Medicine

## 2020-06-20 ENCOUNTER — Other Ambulatory Visit: Payer: Self-pay

## 2020-06-20 VITALS — BP 168/82 | HR 44 | Temp 97.9°F | Resp 18

## 2020-06-20 DIAGNOSIS — Z515 Encounter for palliative care: Secondary | ICD-10-CM | POA: Diagnosis not present

## 2020-06-20 DIAGNOSIS — Z466 Encounter for fitting and adjustment of urinary device: Secondary | ICD-10-CM | POA: Diagnosis not present

## 2020-06-20 DIAGNOSIS — L89311 Pressure ulcer of right buttock, stage 1: Secondary | ICD-10-CM | POA: Diagnosis not present

## 2020-06-20 DIAGNOSIS — E1122 Type 2 diabetes mellitus with diabetic chronic kidney disease: Secondary | ICD-10-CM | POA: Diagnosis not present

## 2020-06-20 DIAGNOSIS — N184 Chronic kidney disease, stage 4 (severe): Secondary | ICD-10-CM | POA: Diagnosis not present

## 2020-06-20 DIAGNOSIS — R001 Bradycardia, unspecified: Secondary | ICD-10-CM

## 2020-06-20 DIAGNOSIS — I5033 Acute on chronic diastolic (congestive) heart failure: Secondary | ICD-10-CM | POA: Diagnosis not present

## 2020-06-20 DIAGNOSIS — I482 Chronic atrial fibrillation, unspecified: Secondary | ICD-10-CM | POA: Diagnosis not present

## 2020-06-20 DIAGNOSIS — L89322 Pressure ulcer of left buttock, stage 2: Secondary | ICD-10-CM | POA: Diagnosis not present

## 2020-06-20 DIAGNOSIS — I5032 Chronic diastolic (congestive) heart failure: Secondary | ICD-10-CM | POA: Diagnosis not present

## 2020-06-20 DIAGNOSIS — E1151 Type 2 diabetes mellitus with diabetic peripheral angiopathy without gangrene: Secondary | ICD-10-CM | POA: Diagnosis not present

## 2020-06-20 DIAGNOSIS — I350 Nonrheumatic aortic (valve) stenosis: Secondary | ICD-10-CM | POA: Diagnosis not present

## 2020-06-20 DIAGNOSIS — R338 Other retention of urine: Secondary | ICD-10-CM | POA: Diagnosis not present

## 2020-06-20 DIAGNOSIS — I130 Hypertensive heart and chronic kidney disease with heart failure and stage 1 through stage 4 chronic kidney disease, or unspecified chronic kidney disease: Secondary | ICD-10-CM | POA: Diagnosis not present

## 2020-06-20 DIAGNOSIS — I214 Non-ST elevation (NSTEMI) myocardial infarction: Secondary | ICD-10-CM | POA: Diagnosis not present

## 2020-06-20 DIAGNOSIS — I480 Paroxysmal atrial fibrillation: Secondary | ICD-10-CM | POA: Diagnosis not present

## 2020-06-20 NOTE — Telephone Encounter (Signed)
Zio monitor minimum is 3 days - ordered for this. Notified [facility] nurse of this, she is familiar. She will notify son to be on look out for monitor, wear for 3 days, and then return. Continue current meds as long as asymptomatic.

## 2020-06-20 NOTE — Telephone Encounter (Signed)
If he is not having symptoms of bradycardia, it is very unlikely that the bradycardia is real.  While in the hospital he was having very frequent PVCs, including periods of trigeminy and bigeminy with "falls" bradycardia due to miscounted ectopic beats with peripheral pulse palpation or automatic blood pressure cuff. His heart rate should be checked by auscultating the heart or by checking an electrical tracing.

## 2020-06-20 NOTE — Telephone Encounter (Signed)
Thanks

## 2020-06-20 NOTE — Telephone Encounter (Signed)
Spoke with [facility]. Patient's HR is 44 without morning carvedilol dose. He was discharged home yesterday (NSTEMI). [facility] nurse is concerned about low HR. Advised to hold AM dose of carvedilol. Check BP/pulse in a few hours and again before evening dose of med is due. Advised would send message to provider to see about getting parameters on carvedilol/other recommendations.  Per notes: Plan is to continue to treat medically given age, dementia, and advanced CKD. Continue Plavix, and statin. He was started on Coreg 6.25 mg bid, but the dose was reduced to 3.125 mg bid because of bradycardia w/ HR 40s.

## 2020-06-20 NOTE — Telephone Encounter (Signed)
STAT if HR is under 50 or over 120 (normal HR is 60-100 beats per minute)  1) What is your heart rate? 44  2) Do you have a log of your heart rate readings (document readings)? Doesn't have log was low in the hospital   3) Do you have any other symptoms? No, asymptomatic    Hasn't had morning meds.

## 2020-06-20 NOTE — Progress Notes (Signed)
[facility] Consult Note Telephone: [phone]  Fax: [phone]    Date of encounter: 06/20/20 PATIENT NAME: [name] [place] [id number]   [phone] (home)  DOB: [birth year] MRN: [id number]  PRIMARY CARE PROVIDER:    Redmond School, MD,  [address] [place] [address] [phone]  REFERRING PROVIDER:   Redmond School, [place],  [place] [address] [phone]  RESPONSIBLE PARTY:    Contact Information    Name Relation Home Work [place], [place] Son   [phone]   [name] Daughter   [phone]   Myers,Betty Sister [phone]     [name], [name]   [phone]     I met face to face with patien in home. Son [name] present during visit, home Health nurse Colletta Maryland also present at visit. Palliative Care was asked to follow this patient by consultation request of  Redmond School, MD to address advance care planning and complex medical decision making. This is a follow up visit.                                  ASSESSMENT AND PLAN / RECOMMENDATIONS:   Advance Care Planning/Goals of Care: Goals include to maximize quality of life and symptom management. Our advance care planning conversation included a discussion about:     The value and importance of advance care planning   Exploration of personal, cultural or spiritual beliefs that might influence medical decisions   Exploration of goals of care in the event of a sudden injury or illness   Review and updating or creation of an  advance directive document .  CODE STATUS: Full code Goal of care: Patient's goal of care is function.  Directives: After discussions on ramifications and implications of code status patient verbalized desire to not be resuscitated. However his son [name] said he want to discuss decision about resuscitattion with the rest of patient's children before a final decision is  taken. [name] made aware that decision about resuscitation is usually a personal decision especially when a patient is aware and able to make decisions independently. Today, patient is awake and oriented x 4. [name] verbalized understanding.  Palliative care will continue to provide support to patient, family and the medical team.  I spent 18 minutes providing this consultation. More than 50% of the time in this consultation was spent in counseling and care coordination. -------------------------------------------------------------------------------  Symptom Management/Plan: Chronic diastolic heart failure: no evidence of acute exacerbation today. Continue current plan of care which includes Lasix 60m in the mornings and 452mas needed if weight gain. Family unable to weigh patient in home. Discussed other evidence of weight gain to include extremieties swelling, increased moist cough. Son RoEdd Johnston understanding. [facility] Chronic Afib: Heart rate today was 44, patient denied chest pain, or lightheadedness. Patient yet to take his morning meds, asked to hold morning dose of Coreg. Phone call made to patient's Cardiologist Dr. HiLysbeth Pennerffice, notifying him of patient's heart rate.  Chronic UTI: continue prophylactic antibiotics Cipro 25025maily. Keep follow up appointments with Urology. Patient is totally dependent on family for all of his ADLs but able to feed self. Ambulates with a wheelchair. His family takes turn to care for him in his home. Questions and concerns were addressed. Patient and his family was encouraged to  call with questions and/or concerns. Provided general support and encouragement, no other unmet needs identified at this time.  Follow up Palliative Care Visit: Palliative care will continue to follow for complex medical decision making, advance care planning, and clarification of goals. Return in about 5 weeks or prn.  PPS:  40%  HOSPICE ELIGIBILITY/DIAGNOSIS: TBD  CHIEF COMPLAIN: Follow up palliative care s/p hospitalization  History obtained from review of Epic EMR and discussion with  [name] and his son [name].  HISTORY OF PRESENT ILLNESS:[name] a 87 y.o.year old malewith multiple medical problems including CKD 4, chronic A-fib, moderate aortic stenosis, hypothyroidism, chronic diastolic heart failure (EF 65-70% on 06/19/2019), BPH, Type 2 diabetes (A1c 7.5), HTN, PAD s/p bilateral BKA. Patient is s/p hospitalization from 06/17/2020 to 06/19/2020 for chest pain related to demand ischemia. This is second episode in the last year. Patient deemed not a good candidate for surgical intervention, he was medically treated due to his multiple comorbid conditions including CKD 4( Cr 3.16, GFR 18). Patient report feeling well today, was finishing breakfast at the time of the visit. He denied chest pain, denied shortness of breath, denied fever or chills. Ten systems reviewed and are negative for acute change, except as noted in the HPI.  I reviewed available labs, medications, imaging, studies and related documents from the EMR.  Records reviewed and summarized above.   Vitals:   06/20/20 1107  BP: (!) 168/82  Pulse: (!) 44  Temp: 97.9 F (36.6 C)  Resp: 18  SpO2: 96% Comment: on room air  TempSrc: Temporal   Physical Exam: Current and past weights: no current weight, no evidence of significant weight gain/loss. Constitutional: NAD General: frail appearing, cooperative, sitting in wheelchair in NAD  EYES: anicteric sclera, no discharge  ENMT: intact hearing, oral mucous membranes moist CV: mummur, no LE edema Pulmonary: LCTA, no increased work of breathing, no cough, room air Abdomen:  no ascites GU: foley cath intact and patent, urine clear and yellow. MSK: mild sarcopenia, bilateral BKA, non- ambulatory wheelchair dependent. Skin: warm and dry, no rashes or wounds on visible skin , skin thin and  fragile, scattered ecchymosis on bilateral arms Neuro:  generalized weakness, no cognitive impairment Psych: non-anxious affect, A and O x 4 Hem/lymph/immuno: no widespread bruising  Current Outpatient Medications on File Prior to Visit  Medication Sig Dispense Refill  . acetaminophen (TYLENOL) 500 MG tablet Take 500 mg by mouth every 6 (six) hours as needed for headache (pain).    [name] amLODipine (NORVASC) 10 MG tablet Take 1 tablet (10 mg total) by mouth daily. 90 tablet 3  . apixaban (ELIQUIS) 2.5 MG TABS tablet Take 1 tablet (2.5 mg total) by mouth 2 (two) times daily. 60 tablet 2  . Ascorbic Acid (VITAMIN C) 1000 MG tablet Take 1,000 mg by mouth daily.     [name] atorvastatin (LIPITOR) 40 MG tablet Take 1 tablet (40 mg total) by mouth every evening. 90 tablet 3  . betamethasone dipropionate 0.05 % cream Apply 1 application topically 2 (two) times daily.    . carvedilol (COREG) 3.125 MG tablet Take 1 tablet (3.125 mg total) by mouth 2 (two) times daily with a meal. 60 tablet 3  . cephALEXin (KEFLEX) 250 MG capsule Take 250 mg by mouth daily.    . Cholecalciferol (VITAMIN D-3) 125 MCG (5000 UT) TABS Take 5,000 Units by mouth every evening.    . ciprofloxacin (CIPRO) 250 MG tablet Take 250 mg by mouth daily.    [name]  clopidogrel (PLAVIX) 75 MG tablet Take 1 tablet (75 mg total) by mouth daily with breakfast. 90 tablet 3  . co-enzyme Q-10 30 MG capsule Take 100 mg by mouth daily.    . Cranberry-Vitamin C-Probiotic (AZO CRANBERRY PO) Take 1 tablet by mouth daily.    [name] doxazosin (CARDURA) 2 MG tablet Take 1 tablet (2 mg total) by mouth daily. 30 tablet 3  . famotidine (PEPCID) 20 MG tablet Take 1 tablet (20 mg total) by mouth daily. (Patient taking differently: Take 20 mg by mouth 2 (two) times daily.) 60 tablet 0  . ferrous sulfate 325 (65 FE) MG tablet Take 325 mg by mouth 2 (two) times daily with a meal.    . furosemide (LASIX) 40 MG tablet Take 2 tablets (80 mg total) by mouth daily. Take 2 tablets  (50m) once daily in the morning. Can take an additional 1 tablet (476m in the afternoon as needed for weight gain (3lbs in 1 day or 5lbs in 1 week). 60 tablet 2  . glipiZIDE (GLUCOTROL) 10 MG tablet Take 10 mg by mouth 2 (two) times daily.    . Marland Kitchenatanoprost (XALATAN) 0.005 % ophthalmic solution Place 1 drop into both eyes at bedtime.    . Marland Kitchenevothyroxine (SYNTHROID) 75 MCG tablet Take 75 mcg by mouth daily before breakfast.     . nitroGLYCERIN (NITROSTAT) 0.4 MG SL tablet Place 1 tablet (0.4 mg total) under the tongue every 5 (five) minutes x 3 doses as needed for chest pain. 25 tablet 3  . ONETOUCH ULTRA test strip 1 each 2 (two) times daily.    . polyethylene glycol (MIRALAX) 17 g packet Take 17 g by mouth daily as needed for moderate constipation. 14 each 0  . potassium chloride SA (KLOR-CON) 20 MEQ tablet Take 2 tablets (40 mEq total) by mouth daily. 60 tablet 3  . pyridoxine (B-6) 100 MG tablet Take 100 mg by mouth daily.    . traMADol (ULTRAM) 50 MG tablet Take 1 tablet (50 mg total) by mouth every 6 (six) hours as needed. 60 tablet 0  . vitamin E 400 UNIT capsule Take 400 Units by mouth daily.    . Marland Kitcheninc gluconate 50 MG tablet Take 50 mg by mouth daily.     No current facility-administered medications on file prior to visit.   Thank you for the opportunity to participate in the care of Mr. RoSterbenz The palliative care team will continue to follow. Please call our office at [phone] we can be of additional assistance.   QuJari FavreDNP, AGPCNP-BC  COVID-19 PATIENT SCREENING TOOL Asked and negative response unless otherwise noted:   Have you had symptoms of covid, tested positive or been in contact with someone with symptoms/positive test in the past 5-10 days?

## 2020-06-20 NOTE — Telephone Encounter (Signed)
Spoke with patient's [name] nurse  She obtained an apical pulse of 44bpm  The patient is well-known to her and she has followed him previously HRhas been 52-59bpm over the last year  Patient has no symptoms at present - denies chest pain, shortness of breath, lightheadedness/dizziness

## 2020-06-20 NOTE — Progress Notes (Unsigned)
Patient enrolled for [employer] to mail a 3 day ZIO XT monitor to address on record.

## 2020-06-20 NOTE — Telephone Encounter (Signed)
Please order a 48 hour Zio and continue same meds as long as asymptomatic.

## 2020-06-21 ENCOUNTER — Other Ambulatory Visit: Payer: Self-pay | Admitting: *Deleted

## 2020-06-21 ENCOUNTER — Encounter: Payer: Medicare Other | Admitting: Physical Therapy

## 2020-06-21 NOTE — Patient Outreach (Signed)
[facility] Care Management  06/21/2020  [name] 08/16/1927 [id number]   Noted that member was admitted to hospital 6/4-6/6 for NSTEMI.  Outgoing call placed to member's son, state he is doing much better, denies current chest pain.  He will go next week to the cardiologist for blood work, will have appointment scheduled to review results.  They are waiting on the 3 day Zio monitor to come in the mail.  Has appointment on Monday with PCP, already been seen in the home by NP with [facility].  Son state medication changes were reviewed.  Denies any urgent concerns, encouraged to contact this care manager with questions.  Agrees to follow up within the next month.  Goals Addressed            This Visit's Progress   . [facility] - Monitor and Manage My Blood Sugar   On track    Timeframe:  Short-Term Goal Priority:  Medium Start Date:          5/16 Expected End Date:   6/16  Barriers: Other - Cognitive, dementia                   - check blood sugar at prescribed times - check blood sugar if I feel it is too high or too low - take the blood sugar log to all doctor visits    Why is this important?   Checking your blood sugar at home helps to keep it from getting very high or very low.  Writing the results in a diary or log helps the doctor know how to care for you.  Your blood sugar log should have the time, date and the results.  Also, write down the amount of insulin or other medicine that you take.  Other information, like what you ate, exercise done and how you were feeling, will also be helpful.     Notes:   11/18 - blood sugar readings reviewed, discussed compliance with medication regime  12/28 - Discussed proper diabetic and low sodium diet  02/11/2020 - Remind of importance of managing blood sugars in correlation to wound healing  2/21 - Confirmed member has ongoing participation with home health for weekly monitoring  3/21  - Encouraged to continue daily blood sugar monitoring as well as recording readings  4/18 - Son report blood sugars range 130-170's, was 219 today.  Relates this to increased appetite and eating over the Easter holiday.  5/16 - Blood sugars increased to around 300's when on antibiotics for UTI, now decreased back down to 130's  6/8 - Son report blood sugars have remained stable over the last few weeks    . [facility] - Set My Target A1C   On track    Barriers: Health Behaviors   Timeframe:  Long-Range Goal Priority:  Medium Start Date:       2/21                      Expected End Date:   7/1              - set target A1C (7)    Why is this important?   Your target A1C is decided together by you and your doctor.  It is based on several things like your age and other health issues.    Notes:   11/18 - Discussed dose change for oral diabetic agents  12/28 - Call placed  to PCP office requesting order for new A1C lab  1/28 - Re-educated on DM management in effort to decrease A1C  2/21 - Review CBG trends, encouraged to continue plan of care  3/21 - Reminded son that controlled CBG's will promote wound healing  4/18 - Most recent A1C - 7.5, son verbalized understanding of ongoing management of blood sugars and consistent A1C within range  5/16 - Member has increased his mobility with outpatient PT, encouraged to continue as increased exercise helps to decrease blood sugars and A1C  6/8 - Goal extended, follow up with PCP on 6/13.        [name], [place], MSN [place] [phone]

## 2020-06-22 DIAGNOSIS — R001 Bradycardia, unspecified: Secondary | ICD-10-CM | POA: Diagnosis not present

## 2020-06-22 DIAGNOSIS — I350 Nonrheumatic aortic (valve) stenosis: Secondary | ICD-10-CM | POA: Diagnosis not present

## 2020-06-22 DIAGNOSIS — I480 Paroxysmal atrial fibrillation: Secondary | ICD-10-CM | POA: Diagnosis not present

## 2020-06-22 DIAGNOSIS — L89311 Pressure ulcer of right buttock, stage 1: Secondary | ICD-10-CM | POA: Diagnosis not present

## 2020-06-22 DIAGNOSIS — I130 Hypertensive heart and chronic kidney disease with heart failure and stage 1 through stage 4 chronic kidney disease, or unspecified chronic kidney disease: Secondary | ICD-10-CM | POA: Diagnosis not present

## 2020-06-22 DIAGNOSIS — I214 Non-ST elevation (NSTEMI) myocardial infarction: Secondary | ICD-10-CM | POA: Diagnosis not present

## 2020-06-22 DIAGNOSIS — L89322 Pressure ulcer of left buttock, stage 2: Secondary | ICD-10-CM | POA: Diagnosis not present

## 2020-06-22 DIAGNOSIS — N184 Chronic kidney disease, stage 4 (severe): Secondary | ICD-10-CM | POA: Diagnosis not present

## 2020-06-22 DIAGNOSIS — Z466 Encounter for fitting and adjustment of urinary device: Secondary | ICD-10-CM | POA: Diagnosis not present

## 2020-06-22 DIAGNOSIS — I5033 Acute on chronic diastolic (congestive) heart failure: Secondary | ICD-10-CM | POA: Diagnosis not present

## 2020-06-22 DIAGNOSIS — E1151 Type 2 diabetes mellitus with diabetic peripheral angiopathy without gangrene: Secondary | ICD-10-CM | POA: Diagnosis not present

## 2020-06-22 DIAGNOSIS — E1122 Type 2 diabetes mellitus with diabetic chronic kidney disease: Secondary | ICD-10-CM | POA: Diagnosis not present

## 2020-06-22 DIAGNOSIS — R338 Other retention of urine: Secondary | ICD-10-CM | POA: Diagnosis not present

## 2020-06-24 DIAGNOSIS — I480 Paroxysmal atrial fibrillation: Secondary | ICD-10-CM | POA: Diagnosis not present

## 2020-06-24 DIAGNOSIS — L89311 Pressure ulcer of right buttock, stage 1: Secondary | ICD-10-CM | POA: Diagnosis not present

## 2020-06-24 DIAGNOSIS — L89322 Pressure ulcer of left buttock, stage 2: Secondary | ICD-10-CM | POA: Diagnosis not present

## 2020-06-24 DIAGNOSIS — E1122 Type 2 diabetes mellitus with diabetic chronic kidney disease: Secondary | ICD-10-CM | POA: Diagnosis not present

## 2020-06-24 DIAGNOSIS — I350 Nonrheumatic aortic (valve) stenosis: Secondary | ICD-10-CM | POA: Diagnosis not present

## 2020-06-24 DIAGNOSIS — E1151 Type 2 diabetes mellitus with diabetic peripheral angiopathy without gangrene: Secondary | ICD-10-CM | POA: Diagnosis not present

## 2020-06-24 DIAGNOSIS — I130 Hypertensive heart and chronic kidney disease with heart failure and stage 1 through stage 4 chronic kidney disease, or unspecified chronic kidney disease: Secondary | ICD-10-CM | POA: Diagnosis not present

## 2020-06-24 DIAGNOSIS — N184 Chronic kidney disease, stage 4 (severe): Secondary | ICD-10-CM | POA: Diagnosis not present

## 2020-06-24 DIAGNOSIS — I5033 Acute on chronic diastolic (congestive) heart failure: Secondary | ICD-10-CM | POA: Diagnosis not present

## 2020-06-24 DIAGNOSIS — Z466 Encounter for fitting and adjustment of urinary device: Secondary | ICD-10-CM | POA: Diagnosis not present

## 2020-06-24 DIAGNOSIS — R338 Other retention of urine: Secondary | ICD-10-CM | POA: Diagnosis not present

## 2020-06-24 DIAGNOSIS — I214 Non-ST elevation (NSTEMI) myocardial infarction: Secondary | ICD-10-CM | POA: Diagnosis not present

## 2020-06-26 ENCOUNTER — Encounter: Payer: Medicare Other | Admitting: Physical Therapy

## 2020-06-26 DIAGNOSIS — I350 Nonrheumatic aortic (valve) stenosis: Secondary | ICD-10-CM | POA: Diagnosis not present

## 2020-06-26 DIAGNOSIS — Z681 Body mass index (BMI) 19 or less, adult: Secondary | ICD-10-CM | POA: Diagnosis not present

## 2020-06-26 DIAGNOSIS — I209 Angina pectoris, unspecified: Secondary | ICD-10-CM | POA: Diagnosis not present

## 2020-06-26 DIAGNOSIS — I25700 Atherosclerosis of coronary artery bypass graft(s), unspecified, with unstable angina pectoris: Secondary | ICD-10-CM | POA: Diagnosis not present

## 2020-06-26 DIAGNOSIS — I5033 Acute on chronic diastolic (congestive) heart failure: Secondary | ICD-10-CM | POA: Diagnosis not present

## 2020-06-27 ENCOUNTER — Ambulatory Visit: Payer: Medicare Other | Admitting: *Deleted

## 2020-06-28 ENCOUNTER — Encounter: Payer: Medicare Other | Admitting: Physical Therapy

## 2020-06-28 DIAGNOSIS — L89311 Pressure ulcer of right buttock, stage 1: Secondary | ICD-10-CM | POA: Diagnosis not present

## 2020-06-28 DIAGNOSIS — R338 Other retention of urine: Secondary | ICD-10-CM | POA: Diagnosis not present

## 2020-06-28 DIAGNOSIS — N184 Chronic kidney disease, stage 4 (severe): Secondary | ICD-10-CM | POA: Diagnosis not present

## 2020-06-28 DIAGNOSIS — I350 Nonrheumatic aortic (valve) stenosis: Secondary | ICD-10-CM | POA: Diagnosis not present

## 2020-06-28 DIAGNOSIS — I214 Non-ST elevation (NSTEMI) myocardial infarction: Secondary | ICD-10-CM | POA: Diagnosis not present

## 2020-06-28 DIAGNOSIS — I480 Paroxysmal atrial fibrillation: Secondary | ICD-10-CM | POA: Diagnosis not present

## 2020-06-28 DIAGNOSIS — I5033 Acute on chronic diastolic (congestive) heart failure: Secondary | ICD-10-CM | POA: Diagnosis not present

## 2020-06-28 DIAGNOSIS — E1122 Type 2 diabetes mellitus with diabetic chronic kidney disease: Secondary | ICD-10-CM | POA: Diagnosis not present

## 2020-06-28 DIAGNOSIS — E1151 Type 2 diabetes mellitus with diabetic peripheral angiopathy without gangrene: Secondary | ICD-10-CM | POA: Diagnosis not present

## 2020-06-28 DIAGNOSIS — I130 Hypertensive heart and chronic kidney disease with heart failure and stage 1 through stage 4 chronic kidney disease, or unspecified chronic kidney disease: Secondary | ICD-10-CM | POA: Diagnosis not present

## 2020-06-28 DIAGNOSIS — L89322 Pressure ulcer of left buttock, stage 2: Secondary | ICD-10-CM | POA: Diagnosis not present

## 2020-06-28 DIAGNOSIS — Z466 Encounter for fitting and adjustment of urinary device: Secondary | ICD-10-CM | POA: Diagnosis not present

## 2020-06-29 ENCOUNTER — Other Ambulatory Visit: Payer: Self-pay

## 2020-06-29 DIAGNOSIS — N184 Chronic kidney disease, stage 4 (severe): Secondary | ICD-10-CM

## 2020-06-29 LAB — BASIC METABOLIC PANEL
BUN/Creatinine Ratio: 13 (ref 10–24)
BUN: 38 mg/dL — ABNORMAL HIGH (ref 10–36)
CO2: 22 mmol/L (ref 20–29)
Calcium: 8.9 mg/dL (ref 8.6–10.2)
Chloride: 95 mmol/L — ABNORMAL LOW (ref 96–106)
Creatinine, Ser: 2.86 mg/dL — ABNORMAL HIGH (ref 0.76–1.27)
Glucose: 201 mg/dL — ABNORMAL HIGH (ref 65–99)
Potassium: 3.5 mmol/L (ref 3.5–5.2)
Sodium: 136 mmol/L (ref 134–144)
eGFR: 20 mL/min/{1.73_m2} — ABNORMAL LOW (ref >59)

## 2020-06-30 DIAGNOSIS — R001 Bradycardia, unspecified: Secondary | ICD-10-CM | POA: Diagnosis not present

## 2020-07-03 ENCOUNTER — Encounter: Payer: Medicare Other | Admitting: Physical Therapy

## 2020-07-05 ENCOUNTER — Encounter: Payer: Medicare Other | Admitting: Physical Therapy

## 2020-07-06 DIAGNOSIS — L89322 Pressure ulcer of left buttock, stage 2: Secondary | ICD-10-CM | POA: Diagnosis not present

## 2020-07-06 DIAGNOSIS — I480 Paroxysmal atrial fibrillation: Secondary | ICD-10-CM | POA: Diagnosis not present

## 2020-07-06 DIAGNOSIS — I350 Nonrheumatic aortic (valve) stenosis: Secondary | ICD-10-CM | POA: Diagnosis not present

## 2020-07-06 DIAGNOSIS — E1151 Type 2 diabetes mellitus with diabetic peripheral angiopathy without gangrene: Secondary | ICD-10-CM | POA: Diagnosis not present

## 2020-07-06 DIAGNOSIS — I214 Non-ST elevation (NSTEMI) myocardial infarction: Secondary | ICD-10-CM | POA: Diagnosis not present

## 2020-07-06 DIAGNOSIS — E1122 Type 2 diabetes mellitus with diabetic chronic kidney disease: Secondary | ICD-10-CM | POA: Diagnosis not present

## 2020-07-06 DIAGNOSIS — Z466 Encounter for fitting and adjustment of urinary device: Secondary | ICD-10-CM | POA: Diagnosis not present

## 2020-07-06 DIAGNOSIS — I130 Hypertensive heart and chronic kidney disease with heart failure and stage 1 through stage 4 chronic kidney disease, or unspecified chronic kidney disease: Secondary | ICD-10-CM | POA: Diagnosis not present

## 2020-07-06 DIAGNOSIS — L89311 Pressure ulcer of right buttock, stage 1: Secondary | ICD-10-CM | POA: Diagnosis not present

## 2020-07-06 DIAGNOSIS — N184 Chronic kidney disease, stage 4 (severe): Secondary | ICD-10-CM | POA: Diagnosis not present

## 2020-07-06 DIAGNOSIS — I5033 Acute on chronic diastolic (congestive) heart failure: Secondary | ICD-10-CM | POA: Diagnosis not present

## 2020-07-06 DIAGNOSIS — R338 Other retention of urine: Secondary | ICD-10-CM | POA: Diagnosis not present

## 2020-07-07 DIAGNOSIS — R3914 Feeling of incomplete bladder emptying: Secondary | ICD-10-CM | POA: Diagnosis not present

## 2020-07-10 ENCOUNTER — Encounter: Payer: Medicare Other | Admitting: Physical Therapy

## 2020-07-10 DIAGNOSIS — N184 Chronic kidney disease, stage 4 (severe): Secondary | ICD-10-CM | POA: Diagnosis not present

## 2020-07-10 DIAGNOSIS — I214 Non-ST elevation (NSTEMI) myocardial infarction: Secondary | ICD-10-CM | POA: Diagnosis not present

## 2020-07-10 DIAGNOSIS — L89311 Pressure ulcer of right buttock, stage 1: Secondary | ICD-10-CM | POA: Diagnosis not present

## 2020-07-10 DIAGNOSIS — Z466 Encounter for fitting and adjustment of urinary device: Secondary | ICD-10-CM | POA: Diagnosis not present

## 2020-07-10 DIAGNOSIS — I130 Hypertensive heart and chronic kidney disease with heart failure and stage 1 through stage 4 chronic kidney disease, or unspecified chronic kidney disease: Secondary | ICD-10-CM | POA: Diagnosis not present

## 2020-07-10 DIAGNOSIS — I5033 Acute on chronic diastolic (congestive) heart failure: Secondary | ICD-10-CM | POA: Diagnosis not present

## 2020-07-10 DIAGNOSIS — E1122 Type 2 diabetes mellitus with diabetic chronic kidney disease: Secondary | ICD-10-CM | POA: Diagnosis not present

## 2020-07-10 DIAGNOSIS — R338 Other retention of urine: Secondary | ICD-10-CM | POA: Diagnosis not present

## 2020-07-10 DIAGNOSIS — L89322 Pressure ulcer of left buttock, stage 2: Secondary | ICD-10-CM | POA: Diagnosis not present

## 2020-07-10 DIAGNOSIS — I480 Paroxysmal atrial fibrillation: Secondary | ICD-10-CM | POA: Diagnosis not present

## 2020-07-10 DIAGNOSIS — E1151 Type 2 diabetes mellitus with diabetic peripheral angiopathy without gangrene: Secondary | ICD-10-CM | POA: Diagnosis not present

## 2020-07-10 DIAGNOSIS — I350 Nonrheumatic aortic (valve) stenosis: Secondary | ICD-10-CM | POA: Diagnosis not present

## 2020-07-11 DIAGNOSIS — I5033 Acute on chronic diastolic (congestive) heart failure: Secondary | ICD-10-CM | POA: Diagnosis not present

## 2020-07-11 DIAGNOSIS — N184 Chronic kidney disease, stage 4 (severe): Secondary | ICD-10-CM | POA: Diagnosis not present

## 2020-07-11 DIAGNOSIS — I130 Hypertensive heart and chronic kidney disease with heart failure and stage 1 through stage 4 chronic kidney disease, or unspecified chronic kidney disease: Secondary | ICD-10-CM | POA: Diagnosis not present

## 2020-07-11 DIAGNOSIS — E1122 Type 2 diabetes mellitus with diabetic chronic kidney disease: Secondary | ICD-10-CM | POA: Diagnosis not present

## 2020-07-12 ENCOUNTER — Encounter: Payer: Medicare Other | Admitting: Physical Therapy

## 2020-07-13 DIAGNOSIS — E1122 Type 2 diabetes mellitus with diabetic chronic kidney disease: Secondary | ICD-10-CM | POA: Diagnosis not present

## 2020-07-13 DIAGNOSIS — E7849 Other hyperlipidemia: Secondary | ICD-10-CM | POA: Diagnosis not present

## 2020-07-13 DIAGNOSIS — I129 Hypertensive chronic kidney disease with stage 1 through stage 4 chronic kidney disease, or unspecified chronic kidney disease: Secondary | ICD-10-CM | POA: Diagnosis not present

## 2020-07-17 ENCOUNTER — Other Ambulatory Visit: Payer: Self-pay | Admitting: Physician Assistant

## 2020-07-18 ENCOUNTER — Encounter: Payer: Medicare Other | Admitting: Physical Therapy

## 2020-07-18 DIAGNOSIS — I350 Nonrheumatic aortic (valve) stenosis: Secondary | ICD-10-CM | POA: Diagnosis not present

## 2020-07-18 DIAGNOSIS — Z466 Encounter for fitting and adjustment of urinary device: Secondary | ICD-10-CM | POA: Diagnosis not present

## 2020-07-18 DIAGNOSIS — I214 Non-ST elevation (NSTEMI) myocardial infarction: Secondary | ICD-10-CM | POA: Diagnosis not present

## 2020-07-18 DIAGNOSIS — N184 Chronic kidney disease, stage 4 (severe): Secondary | ICD-10-CM | POA: Diagnosis not present

## 2020-07-18 DIAGNOSIS — I5033 Acute on chronic diastolic (congestive) heart failure: Secondary | ICD-10-CM | POA: Diagnosis not present

## 2020-07-18 DIAGNOSIS — I130 Hypertensive heart and chronic kidney disease with heart failure and stage 1 through stage 4 chronic kidney disease, or unspecified chronic kidney disease: Secondary | ICD-10-CM | POA: Diagnosis not present

## 2020-07-18 DIAGNOSIS — E1122 Type 2 diabetes mellitus with diabetic chronic kidney disease: Secondary | ICD-10-CM | POA: Diagnosis not present

## 2020-07-18 DIAGNOSIS — I480 Paroxysmal atrial fibrillation: Secondary | ICD-10-CM | POA: Diagnosis not present

## 2020-07-18 DIAGNOSIS — L89311 Pressure ulcer of right buttock, stage 1: Secondary | ICD-10-CM | POA: Diagnosis not present

## 2020-07-18 DIAGNOSIS — E1151 Type 2 diabetes mellitus with diabetic peripheral angiopathy without gangrene: Secondary | ICD-10-CM | POA: Diagnosis not present

## 2020-07-18 DIAGNOSIS — L89322 Pressure ulcer of left buttock, stage 2: Secondary | ICD-10-CM | POA: Diagnosis not present

## 2020-07-18 DIAGNOSIS — R338 Other retention of urine: Secondary | ICD-10-CM | POA: Diagnosis not present

## 2020-07-19 ENCOUNTER — Encounter: Payer: Self-pay | Admitting: Physician Assistant

## 2020-07-19 ENCOUNTER — Other Ambulatory Visit: Payer: Self-pay

## 2020-07-19 ENCOUNTER — Ambulatory Visit: Payer: Medicare Other | Admitting: Physician Assistant

## 2020-07-19 VITALS — BP 160/70 | HR 60 | Resp 18 | Ht 71.0 in | Wt 216.0 lb

## 2020-07-19 DIAGNOSIS — N184 Chronic kidney disease, stage 4 (severe): Secondary | ICD-10-CM | POA: Diagnosis not present

## 2020-07-19 DIAGNOSIS — I350 Nonrheumatic aortic (valve) stenosis: Secondary | ICD-10-CM

## 2020-07-19 DIAGNOSIS — I5032 Chronic diastolic (congestive) heart failure: Secondary | ICD-10-CM | POA: Diagnosis not present

## 2020-07-19 DIAGNOSIS — I10 Essential (primary) hypertension: Secondary | ICD-10-CM | POA: Diagnosis not present

## 2020-07-19 DIAGNOSIS — E119 Type 2 diabetes mellitus without complications: Secondary | ICD-10-CM | POA: Diagnosis not present

## 2020-07-19 DIAGNOSIS — I739 Peripheral vascular disease, unspecified: Secondary | ICD-10-CM | POA: Diagnosis not present

## 2020-07-19 DIAGNOSIS — I480 Paroxysmal atrial fibrillation: Secondary | ICD-10-CM | POA: Diagnosis not present

## 2020-07-19 DIAGNOSIS — I2510 Atherosclerotic heart disease of native coronary artery without angina pectoris: Secondary | ICD-10-CM

## 2020-07-19 DIAGNOSIS — E785 Hyperlipidemia, unspecified: Secondary | ICD-10-CM | POA: Diagnosis not present

## 2020-07-19 NOTE — Patient Instructions (Signed)
Medication Instructions:  Your physician recommends that you continue on your current medications as directed. Please refer to the Current Medication list given to you today. Continue to monitor blood pressure at home. If systolic (top) number is greater than 140 on consist multiple checks, give our office a call    *If you need a refill on your cardiac medications before your next appointment, please call your pharmacy*  Lab Work: Your physician recommends that you return for lab work in:  BMET If you have labs (blood work) drawn today and your tests are completely normal, you will receive your results only by: [employer] (if you have MyChart) OR A paper copy in the mail If you have any lab test that is abnormal or we need to change your treatment, we will call you to review the results.  Testing/Procedures: NONE ordered at this time of appointment   Follow-Up: At [facility], you and your health needs are our priority.  As part of our continuing mission to provide you with exceptional heart care, we have created designated Provider Care Teams.  These Care Teams include your primary Cardiologist (physician) and Advanced Practice Providers (APPs -  Physician Assistants and Nurse Practitioners) who all work together to provide you with the care you need, when you need it.   Your next appointment:   3-4 month(s)  The format for your next appointment:   In Person  Provider:   K. Mali Hilty, MD  Other Instructions

## 2020-07-19 NOTE — Progress Notes (Signed)
Cardiology Office Note:    Date:  07/21/2020   ID:  [name], DOB 1927-12-07, MRN [id number]  PCP:  Redmond School, MD   [facility] Providers Cardiologist:  Pixie Casino, MD     Referring MD: Redmond School, MD   Chief Complaint  Patient presents with   Follow-up    Seen for Dr. Debara Pickett    History of Present Illness:    [name] is a 87 y.o. male with a hx of chronic diastolic heart failure, PAF on Eliquis, CAD, hypertension, hyperlipidemia, DM 2, PAD s/p bilateral BKA, stage III-IV CKD and aortic stenosis.  Patient was diagnosed with atrial fibrillation during admission in June 2019 and was started on Eliquis.  Last echocardiogram obtained on 08/26/2019 showed EF 55 to 60%, grade 1 DD, moderate to severe aortic stenosis.  Due to valve issue, he was seen by Dr. Angelena Form of structural heart clinic.  He was thought not to be a good candidate for TAVR due to advanced kidney disease.  He was last hospitalized in February 2022 due to altered mental status in the setting of UTI.  More recently, he presented to [facility] on 06/17/2020 for evaluation of chest pain.  He was ruled in for NSTEMI and transferred to [facility] for further evaluation.  Creatinine was 2.87 on admission.  He was felt not to be a candidate for invasive coronary evaluation and also deemed inappropriate candidate for aortic valve replacement via TAVR.  Goal of care has been discussed with the patient, he was agreeable to CPR and medication if he does have cardiac arrest however he has been made partial code DO NOT INTUBATE.  During this admission, serial enzyme trended up to 1224.  This was felt to be due to demand ischemia in the setting of heart failure and aortic stenosis.  Carvedilol was reduced to 3.125 mg due to bradycardia down to the 40s on telemetry.  Cardura 2 mg was added to his medical regimen.  He was diuresed with IV Lasix, renal function worsened prior to discharge, creatinine plateaued at 3.6.  He  was instructed to hold Lasix for 1 day and resume Lasix at 80 mg daily after discharge.  He has also been instructed to take extra 40 mg daily of Lasix as needed for weight gain.   Patient presents today for follow-up along with his daughter.  Blood pressure is elevated today at 160/70, however blood pressure was 138/76 yesterday based on home nurses check.  I asked the daughter to continue to monitor blood pressure reading, if systolic blood pressures persistently greater than 140, I would recommend increase Cardura to 4 mg daily.  Otherwise he will need a repeat patient metabolic panel today and follow-up with Dr. Debara Pickett in 3 to 4 months.   Past Medical History:  Diagnosis Date   Arthritis    Borderline diabetic    CHF (congestive heart failure) (HCC)    Chronic kidney disease    Diabetes mellitus without complication ([place])    Dysrhythmia    Glaucoma    Hyperlipidemia    Hypertension    Hypothyroidism    Myocardial infarction [facility])    PVD (peripheral vascular disease) ([place])    Sleep apnea    Cpap ordered but doesnt use   Urinary retention due to benign prostatic hyperplasia 12/28/2018    Past Surgical History:  Procedure Laterality Date   ABDOMINAL AORTOGRAM W/LOWER EXTREMITY N/A 07/27/2018   Procedure: ABDOMINAL AORTOGRAM W/LOWER EXTREMITY;  Surgeon:  Lorretta Harp, MD;  Location: [place] [facility];  Service: Cardiovascular;  Laterality: N/A;   AMPUTATION Right 08/28/2018   Procedure: RIGHT BELOW KNEE AMPUTATION;  Surgeon: [name], MD;  Location: [place];  Service: Orthopedics;  Laterality: Right;   AMPUTATION Left 12/02/2018   Procedure: LEFT TRANSMETATARSAL AMPUTATION AND NEGATIVE PRESSURE WOUND VAC PLACEMENT;  Surgeon: [name], MD;  Location: [place];  Service: Orthopedics;  Laterality: Left;   AMPUTATION Left 12/18/2018   Procedure: LEFT BELOW KNEE AMPUTATION;  Surgeon: [name], MD;  Location: [place];  Service: Orthopedics;  Laterality: Left;   APPENDECTOMY   1943   BELOW KNEE LEG AMPUTATION Left 12/18/2018   CATARACT EXTRACTION  2012   x2   COLONOSCOPY N/A 11/01/2014   Procedure: COLONOSCOPY;  Surgeon: Aviva Signs, MD;  [facility];  Service: Gastroenterology;  Laterality: N/A;   ESOPHAGOGASTRODUODENOSCOPY N/A 11/01/2014   Procedure: ESOPHAGOGASTRODUODENOSCOPY (EGD);  Surgeon: Aviva Signs, MD;  [facility];  Service: Gastroenterology;  Laterality: N/A;   HERNIA REPAIR Left    INGUINAL HERNIA REPAIR Right 03/01/2013   Procedure: RIGHT INGUINAL HERNIORRHAPHY;  Surgeon: Jamesetta So, MD;  Location: [facility];  Service: General;  Laterality: Right;   INSERTION OF MESH Right 03/01/2013   Procedure: INSERTION OF MESH;  Surgeon: Jamesetta So, MD;  [facility];  Service: General;  Laterality: Right;   LOWER EXTREMITY ANGIOGRAPHY Right 08/25/2018   Procedure: LOWER EXTREMITY ANGIOGRAPHY;  Surgeon: Wellington Hampshire, MD;  Location: [place] [facility];  Service: Cardiovascular;  Laterality: Right;   LOWER EXTREMITY ANGIOGRAPHY N/A 11/25/2018   Procedure: LOWER EXTREMITY ANGIOGRAPHY;  Surgeon: Wellington Hampshire, MD;  Location: [place] [facility];  Service: Cardiovascular;  Laterality: N/A;   NM MYOCAR PERF WALL MOTION  06/05/2010   no significant ischemia   PERIPHERAL VASCULAR ATHERECTOMY Right 08/03/2018   Procedure: PERIPHERAL VASCULAR ATHERECTOMY;  Surgeon: Lorretta Harp, MD;  Location: [place] [facility];  Service: Cardiovascular;  Laterality: Right;   PERIPHERAL VASCULAR ATHERECTOMY Left 11/23/2018   Procedure: PERIPHERAL VASCULAR ATHERECTOMY;  Surgeon: Lorretta Harp, MD;  Location: [place] [facility];  Service: Cardiovascular;  Laterality: Left;  sfa with dc balloon   PERIPHERAL VASCULAR BALLOON ANGIOPLASTY Left 11/25/2018   Procedure: PERIPHERAL VASCULAR BALLOON ANGIOPLASTY;  Surgeon: Wellington Hampshire, MD;  Location: [place] [facility];  Service: Cardiovascular;  Laterality: Left;  popliteal   PERIPHERAL VASCULAR  INTERVENTION Left 11/25/2018   Procedure: PERIPHERAL VASCULAR INTERVENTION;  Surgeon: Wellington Hampshire, MD;  Location: [place] [facility];  Service: Cardiovascular;  Laterality: Left;  tibial peroneal trunk and peroneal stents    stent  06/14/2010   left leg   STUMP REVISION Left 01/20/2019   Procedure: REVISION LEFT BELOW KNEE AMPUTATION;  Surgeon: [name], MD;  Location: [place];  Service: Orthopedics;  Laterality: Left;   US ECHOCARDIOGRAPHY  01/21/2006   moderate mitral annular ca+, mild MR, AOV moderately sclerotic.    Current Medications: Current Meds  Medication Sig   acetaminophen (TYLENOL) 500 MG tablet Take 500 mg by mouth every 6 (six) hours as needed for headache (pain).   apixaban (ELIQUIS) 2.5 MG TABS tablet Take 1 tablet (2.5 mg total) by mouth 2 (two) times daily.   Ascorbic Acid (VITAMIN C) 1000 MG tablet Take 1,000 mg by mouth daily.    betamethasone dipropionate 0.05 % cream Apply 1 application topically 2 (two) times daily.  carvedilol (COREG) 3.125 MG tablet Take 1 tablet (3.125 mg total) by mouth 2 (two) times daily with a meal.   cephALEXin (KEFLEX) 250 MG capsule Take 250 mg by mouth daily.   Cholecalciferol (VITAMIN D-3) 125 MCG (5000 UT) TABS Take 5,000 Units by mouth every evening.   ciprofloxacin (CIPRO) 250 MG tablet Take 250 mg by mouth daily.   clopidogrel (PLAVIX) 75 MG tablet Take 1 tablet (75 mg total) by mouth daily with breakfast.   co-enzyme Q-10 30 MG capsule Take 100 mg by mouth daily.   Cranberry-Vitamin C-Probiotic (AZO CRANBERRY PO) Take 1 tablet by mouth daily.   doxazosin (CARDURA) 2 MG tablet Take 1 tablet (2 mg total) by mouth daily.   famotidine (PEPCID) 20 MG tablet Take 1 tablet (20 mg total) by mouth daily. (Patient taking differently: Take 20 mg by mouth 2 (two) times daily.)   ferrous sulfate 325 (65 FE) MG tablet Take 325 mg by mouth 2 (two) times daily with a meal.   furosemide (LASIX) 40 MG tablet Take 2 tablets (80 mg total) by  mouth daily. Take 2 tablets (80mg ) once daily in the morning. Can take an additional 1 tablet (40mg ) in the afternoon as needed for weight gain (3lbs in 1 day or 5lbs in 1 week).   glipiZIDE (GLUCOTROL) 10 MG tablet Take 10 mg by mouth 2 (two) times daily.   latanoprost (XALATAN) 0.005 % ophthalmic solution Place 1 drop into both eyes at bedtime.   levothyroxine (SYNTHROID) 75 MCG tablet Take 75 mcg by mouth daily before breakfast.    nitroGLYCERIN (NITROSTAT) 0.4 MG SL tablet Place 1 tablet (0.4 mg total) under the tongue every 5 (five) minutes x 3 doses as needed for chest pain.   ONETOUCH ULTRA test strip 1 each 2 (two) times daily.   polyethylene glycol (MIRALAX) 17 g packet Take 17 g by mouth daily as needed for moderate constipation.   potassium chloride SA (KLOR-CON) 20 MEQ tablet Take 2 tablets (40 mEq total) by mouth daily.   pyridoxine (B-6) 100 MG tablet Take 100 mg by mouth daily.   QUERCETIN PO Take 1,000 mg by mouth daily.   traMADol (ULTRAM) 50 MG tablet Take 1 tablet (50 mg total) by mouth every 6 (six) hours as needed.   vitamin E 400 UNIT capsule Take 400 Units by mouth daily.   zinc gluconate 50 MG tablet Take 50 mg by mouth daily.   [DISCONTINUED] atorvastatin (LIPITOR) 40 MG tablet Take 1 tablet (40 mg total) by mouth every evening.     Allergies:   Iodinated diagnostic agents, Dye fdc red [red dye], and Hydralazine   Social History   Socioeconomic History   Marital status: Widowed    Spouse name: Not on file   Number of children: 3   Years of education: 17 th   Highest education level: Not on file  Occupational History   Occupation: Retired Psychologist, sport and exercise  Tobacco Use   Smoking status: Former    Pack years: 0.00   Smokeless tobacco: Former    Types: Nurse, children's Use: Never used  Substance and Sexual Activity   Alcohol use: No   Drug use: No   Sexual activity: Not Currently  Other Topics Concern   Not on file  Social History Narrative    Has 2  children! Very supportive.    Social Determinants of Health   Financial Resource Strain: Not on file  Food Insecurity: No Food Insecurity  Worried About Charity fundraiser in the Last Year: Never true   [place] in the Last Year: Never true  Transportation Needs: No Transportation Needs   Lack of Transportation (Medical): No   Lack of Transportation (Non-Medical): No  Physical Activity: Not on file  Stress: Not on file  Social Connections: Not on file     Family History: The patient's family history includes Cancer in his mother; Heart attack in his brother, brother, and father; Stroke in his father.  ROS:   Please see the history of present illness.     All other systems reviewed and are negative.  EKGs/Labs/Other Studies Reviewed:    The following studies were reviewed today:  Echo 06/18/2020 1. Left ventricular ejection fraction, by estimation, is 65 to 70%. The  left ventricle has normal function. The left ventricle has no regional  wall motion abnormalities. There is mild concentric left ventricular  hypertrophy. Left ventricular diastolic  parameters are consistent with Grade II diastolic dysfunction  (pseudonormalization). Elevated left atrial pressure.   2. Right ventricular systolic function is normal. The right ventricular  size is normal. There is mildly elevated pulmonary artery systolic  pressure.   3. Left atrial size was mildly dilated.   4. The mitral valve is normal in structure. Mild mitral valve  regurgitation. No evidence of mitral stenosis.   5. The aortic valve is tricuspid. There is severe calcifcation of the  aortic valve. There is severe thickening of the aortic valve. Aortic valve  regurgitation is mild. Moderate to severe aortic valve stenosis.   6. The inferior vena cava is dilated in size with >50% respiratory  variability, suggesting right atrial pressure of 8 mmHg.    EKG:  EKG is not ordered today.    Recent Labs: 02/24/2020: TSH  2.659 06/17/2020: ALT 18; B Natriuretic Peptide 412.0; Hemoglobin 11.9; Platelets 203 06/18/2020: Magnesium 2.3 07/19/2020: BUN 38; Creatinine, Ser 2.90; Potassium 3.6; Sodium 138  Recent Lipid Panel    Component Value Date/Time   CHOL 80 02/03/2019 0442   CHOL 99 08/06/2012 1116   TRIG 85 02/03/2019 0442   TRIG 89 08/06/2012 1116   HDL 26 (L) 02/03/2019 0442   HDL 39 (L) 08/06/2012 1116   CHOLHDL 3.1 02/03/2019 0442   VLDL 17 02/03/2019 0442   LDLCALC 37 02/03/2019 0442   LDLCALC 42 08/06/2012 1116     Risk Assessment/Calculations:    CHA2DS2-VASc Score = 6  This indicates a 9.7% annual risk of stroke. The patient's score is based upon: CHF History: Yes HTN History: Yes Diabetes History: Yes Stroke History: No Vascular Disease History: Yes Age Score: 2 Gender Score: 0         Physical Exam:    VS:  BP (!) 160/70 (BP Location: Left Arm, Patient Position: Sitting, Cuff Size: Normal)   Pulse 60   Resp 18   Ht 5\' 11"  (1.803 m)   Wt 216 lb (98 kg)   SpO2 99%   BMI 30.13 kg/m     Wt Readings from Last 3 Encounters:  07/19/20 216 lb (98 kg)  06/19/20 207 lb 3.7 oz (94 kg)  03/28/20 220 lb (99.8 kg)     GEN:  Well nourished, well developed in no acute distress HEENT: Normal NECK: No JVD; No carotid bruits LYMPHATICS: No lymphadenopathy CARDIAC: RRR, no murmurs, rubs, gallops RESPIRATORY:  Clear to auscultation without rales, wheezing or rhonchi  ABDOMEN: Soft, non-tender, non-distended MUSCULOSKELETAL:  No edema; No deformity  SKIN: Warm and dry NEUROLOGIC:  Alert and oriented x 3 PSYCHIATRIC:  Normal affect   ASSESSMENT:    1. Chronic diastolic CHF (congestive heart failure) ([place])   2. Chronic kidney disease (CKD), stage IV (severe) ([place])   3. PAF (paroxysmal atrial fibrillation) ([place])   4. Coronary artery disease involving native coronary artery of native heart without angina pectoris   5. Essential hypertension   6. Hyperlipidemia LDL goal <70   7.  Controlled type 2 diabetes mellitus without complication, without long-term current use of insulin ([place])   8. PAD (peripheral artery disease) ([place])   9. Severe aortic stenosis    PLAN:    In order of problems listed above:  Chronic diastolic heart failure: Euvolemic on physical exam  CKD stage IV: Obtain basic metabolic panel  PAF: On Eliquis and carvedilol  CAD: Denies any recent chest pain  Hyperlipidemia: On Lipitor  DM2: Managed by primary care provider  PAD: S/p bilateral BKA.  Severe aortic stenosis: Not a operable candidate.  He likely will have a progressive decline in the next year        Medication Adjustments/Labs and Tests Ordered: Current medicines are reviewed at length with the patient today.  Concerns regarding medicines are outlined above.  Orders Placed This Encounter  Procedures   Basic metabolic panel   No orders of the defined types were placed in this encounter.   Patient Instructions  Medication Instructions:  Your physician recommends that you continue on your current medications as directed. Please refer to the Current Medication list given to you today. Continue to monitor blood pressure at home. If systolic (top) number is greater than 140 on consist multiple checks, give our office a call    *If you need a refill on your cardiac medications before your next appointment, please call your pharmacy*  Lab Work: Your physician recommends that you return for lab work in:  BMET If you have labs (blood work) drawn today and your tests are completely normal, you will receive your results only by: [employer] (if you have MyChart) OR A paper copy in the mail If you have any lab test that is abnormal or we need to change your treatment, we will call you to review the results.  Testing/Procedures: NONE ordered at this time of appointment   Follow-Up: At [facility], you and your health needs are our priority.  As part of our continuing  mission to provide you with exceptional heart care, we have created designated Provider Care Teams.  These Care Teams include your primary Cardiologist (physician) and Advanced Practice Providers (APPs -  Physician Assistants and Nurse Practitioners) who all work together to provide you with the care you need, when you need it.   Your next appointment:   3-4 month(s)  The format for your next appointment:   In Person  Provider:   K. Mali Hilty, MD  Other Instructions    Signed, Almyra Deforest, [place]  07/21/2020 11:24 PM    [place]

## 2020-07-20 ENCOUNTER — Encounter: Payer: Medicare Other | Admitting: Physical Therapy

## 2020-07-20 LAB — BASIC METABOLIC PANEL
BUN/Creatinine Ratio: 13 (ref 10–24)
BUN: 38 mg/dL — ABNORMAL HIGH (ref 10–36)
CO2: 23 mmol/L (ref 20–29)
Calcium: 9 mg/dL (ref 8.6–10.2)
Chloride: 98 mmol/L (ref 96–106)
Creatinine, Ser: 2.9 mg/dL — ABNORMAL HIGH (ref 0.76–1.27)
Glucose: 183 mg/dL — ABNORMAL HIGH (ref 65–99)
Potassium: 3.6 mmol/L (ref 3.5–5.2)
Sodium: 138 mmol/L (ref 134–144)
eGFR: 20 mL/min/{1.73_m2} — ABNORMAL LOW (ref >59)

## 2020-07-21 ENCOUNTER — Encounter: Payer: Self-pay | Admitting: Physician Assistant

## 2020-07-21 ENCOUNTER — Other Ambulatory Visit: Payer: Self-pay | Admitting: *Deleted

## 2020-07-21 NOTE — Patient Outreach (Signed)
[facility]) Care Management  [place]  07/21/2020   ORLEY LAWRY 1927/10/06 [id number]   Outgoing call placed to member's daughter, state member is "well."  She has retired and will now be alternating weeks with her brother caring for member.  Report member has been seen by [facility] nurse, Cardiology, and [facility] nurse this week.  He has not restarted PT as there is concern as to whether this was causing strain  on his heart.  She will continue to monitor blood pressure, has been stable, less than goal at home.  Denies any urgent concerns, encouraged to contact this care manager with questions.    Encounter Medications:  Outpatient Encounter Medications as of 07/21/2020  Medication Sig   acetaminophen (TYLENOL) 500 MG tablet Take 500 mg by mouth every 6 (six) hours as needed for headache (pain).   amLODipine (NORVASC) 10 MG tablet Take 1 tablet (10 mg total) by mouth daily.   apixaban (ELIQUIS) 2.5 MG TABS tablet Take 1 tablet (2.5 mg total) by mouth 2 (two) times daily.   Ascorbic Acid (VITAMIN C) 1000 MG tablet Take 1,000 mg by mouth daily.    atorvastatin (LIPITOR) 40 MG tablet TAKE 1 TABLET(40 MG) BY MOUTH EVERY EVENING   betamethasone dipropionate 0.05 % cream Apply 1 application topically 2 (two) times daily.   carvedilol (COREG) 3.125 MG tablet Take 1 tablet (3.125 mg total) by mouth 2 (two) times daily with a meal.   cephALEXin (KEFLEX) 250 MG capsule Take 250 mg by mouth daily.   Cholecalciferol (VITAMIN D-3) 125 MCG (5000 UT) TABS Take 5,000 Units by mouth every evening.   ciprofloxacin (CIPRO) 250 MG tablet Take 250 mg by mouth daily.   clopidogrel (PLAVIX) 75 MG tablet Take 1 tablet (75 mg total) by mouth daily with breakfast.   co-enzyme Q-10 30 MG capsule Take 100 mg by mouth daily.   Cranberry-Vitamin C-Probiotic (AZO CRANBERRY PO) Take 1 tablet by mouth daily.   doxazosin (CARDURA) 2 MG tablet Take 1 tablet (2 mg total) by mouth daily.   famotidine  (PEPCID) 20 MG tablet Take 1 tablet (20 mg total) by mouth daily. (Patient taking differently: Take 20 mg by mouth 2 (two) times daily.)   ferrous sulfate 325 (65 FE) MG tablet Take 325 mg by mouth 2 (two) times daily with a meal.   furosemide (LASIX) 40 MG tablet Take 2 tablets (80 mg total) by mouth daily. Take 2 tablets (24m) once daily in the morning. Can take an additional 1 tablet (432m in the afternoon as needed for weight gain (3lbs in 1 day or 5lbs in 1 week).   glipiZIDE (GLUCOTROL) 10 MG tablet Take 10 mg by mouth 2 (two) times daily.   latanoprost (XALATAN) 0.005 % ophthalmic solution Place 1 drop into both eyes at bedtime.   levothyroxine (SYNTHROID) 75 MCG tablet Take 75 mcg by mouth daily before breakfast.    nitroGLYCERIN (NITROSTAT) 0.4 MG SL tablet Place 1 tablet (0.4 mg total) under the tongue every 5 (five) minutes x 3 doses as needed for chest pain.   ONETOUCH ULTRA test strip 1 each 2 (two) times daily.   polyethylene glycol (MIRALAX) 17 g packet Take 17 g by mouth daily as needed for moderate constipation.   potassium chloride SA (KLOR-CON) 20 MEQ tablet Take 2 tablets (40 mEq total) by mouth daily.   pyridoxine (B-6) 100 MG tablet Take 100 mg by mouth daily.   QUERCETIN PO Take 1,000 mg  by mouth daily.   traMADol (ULTRAM) 50 MG tablet Take 1 tablet (50 mg total) by mouth every 6 (six) hours as needed.   vitamin E 400 UNIT capsule Take 400 Units by mouth daily.   zinc gluconate 50 MG tablet Take 50 mg by mouth daily.   No facility-administered encounter medications on file as of 07/21/2020.    Functional Status:  In your present state of health, do you have any difficulty performing the following activities: 06/17/2020 02/27/2020  Hearing? Y N  Vision? N N  Difficulty concentrating or making decisions? Tempie Donning  Walking or climbing stairs? Y Y  Dressing or bathing? Y Y  Doing errands, shopping? Y Y  Some recent data might be hidden    Fall/Depression Screening: Fall Risk   03/28/2020 07/21/2019 06/02/2019  Falls in the past year? 0 1 1  Number falls in past yr: - 1 1  Injury with Fall? - 1 1  Risk for fall due to : Impaired balance/gait;Impaired mobility Impaired mobility;Impaired balance/gait Impaired mobility;Impaired balance/gait;History of fall(s)  Risk for fall due to: Comment - Bilateral amputation bilateral BKA  Follow up Falls evaluation completed;Education provided Falls prevention discussed;Education provided;Falls evaluation completed Falls prevention discussed;Education provided;Falls evaluation completed   PHQ 2/9 Scores 03/28/2020 10/19/2019 06/02/2019 01/12/2019  PHQ - 2 Score 0 - 1 2  PHQ- 9 Score - - - 3  Exception Documentation - Other- indicate reason in comment box - -    Assessment:   Care Plan Care Plan : Diabetes Type 2 (Adult)  Updates made by [name], RN since 07/21/2020 12:00 AM     Problem: Glycemic Management (Diabetes, Type 2)   Priority: High     Long-Range Goal: Glycemic Management Optimized evidenced by A1C decreased </= 7 within the next 3 months   Start Date: 07/21/2020  Expected End Date: 09/19/2020  Recent Progress: On track  Priority: High  Note:   Promoted self-monitoring of blood glucose levels.   Assessed and addressed barriers to management plan, such as food insecurity, age, developmental ability, depression, anxiety, fear of hypoglycemia or weight gain, as well as medication cost, side effects and complicated regimen.   6/8 - Goal extended, follow up with PCP on 6/13  7/8 - goal almost met, date reset     Task: Alleviate Barriers to Glycemic Management   Due Date: 09/19/2020  Priority: Routine  Note:   Care Management Activities:    - blood glucose readings reviewed - resources required to improve adherence to care identified - self-awareness of signs/symptoms of hypo or hyperglycemia encouraged - use of blood glucose monitoring log promoted    Notes:       Goals Addressed             This  Visit's Progress    [facility] - Monitor and Manage My Blood Sugar   On track    Timeframe:  Short-Term Goal Priority:  Medium Start Date:          5/16 Expected End Date:  8/16  Barriers: Other - Cognitive, dementia                   - check blood sugar at prescribed times - check blood sugar if I feel it is too high or too low - take the blood sugar log to all doctor visits    Why is this important?   Checking your blood sugar at home helps to keep it from getting very high or  very low.  Writing the results in a diary or log helps the doctor know how to care for you.  Your blood sugar log should have the time, date and the results.  Also, write down the amount of insulin or other medicine that you take.  Other information, like what you ate, exercise done and how you were feeling, will also be helpful.     Notes:   11/18 - blood sugar readings reviewed, discussed compliance with medication regime  12/28 - Discussed proper diabetic and low sodium diet  02/11/2020 - Remind of importance of managing blood sugars in correlation to wound healing  2/21 - Confirmed member has ongoing participation with home health for weekly monitoring  3/21 - Encouraged to continue daily blood sugar monitoring as well as recording readings  4/18 - Son report blood sugars range 130-170's, was 219 today.  Relates this to increased appetite and eating over the Easter holiday.  5/16 - Blood sugars increased to around 300's when on antibiotics for UTI, now decreased back down to 130's  6/8 - Son report blood sugars have remained stable over the last few weeks  7/8 - Per daughter, member's blood sugar average less than 140, report he is doing well      [facility] - Set My Target A1C   On track    Barriers: Health Behaviors   Timeframe:  Long-Range Goal Priority:  Medium Start Date:       7/8     (goal restarted)                Expected End Date:   11/8          - set target A1C (7)    Why is this  important?   Your target A1C is decided together by you and your doctor.  It is based on several things like your age and other health issues.    Notes:   11/18 - Discussed dose change for oral diabetic agents  12/28 - Call placed to PCP office requesting order for new A1C lab  1/28 - Re-educated on DM management in effort to decrease A1C  2/21 - Review CBG trends, encouraged to continue plan of care  3/21 - Reminded son that controlled CBG's will promote wound healing  4/18 - Most recent A1C - 7.5, son verbalized understanding of ongoing management of blood sugars and consistent A1C within range  5/16 - Member has increased his mobility with outpatient PT, encouraged to continue as increased exercise helps to decrease blood sugars and A1C  6/8 - Goal extended, follow up with PCP on 6/13.    7/8 - Continues to work on improving A1C, daughter now retired and will be working with member more often.           Plan:  Follow-up: Patient agrees to Care Plan and Follow-up. Follow-up in 1 month(s).  [name], [place], MSN [place] [phone]

## 2020-07-25 ENCOUNTER — Encounter: Payer: Medicare Other | Admitting: Physical Therapy

## 2020-07-27 ENCOUNTER — Encounter: Payer: Medicare Other | Admitting: Physical Therapy

## 2020-07-27 ENCOUNTER — Other Ambulatory Visit: Payer: Self-pay | Admitting: Physician Assistant

## 2020-07-27 DIAGNOSIS — E1151 Type 2 diabetes mellitus with diabetic peripheral angiopathy without gangrene: Secondary | ICD-10-CM | POA: Diagnosis not present

## 2020-07-27 DIAGNOSIS — I5033 Acute on chronic diastolic (congestive) heart failure: Secondary | ICD-10-CM | POA: Diagnosis not present

## 2020-07-27 DIAGNOSIS — R338 Other retention of urine: Secondary | ICD-10-CM | POA: Diagnosis not present

## 2020-07-27 DIAGNOSIS — Z466 Encounter for fitting and adjustment of urinary device: Secondary | ICD-10-CM | POA: Diagnosis not present

## 2020-07-27 DIAGNOSIS — N184 Chronic kidney disease, stage 4 (severe): Secondary | ICD-10-CM | POA: Diagnosis not present

## 2020-07-27 DIAGNOSIS — L89311 Pressure ulcer of right buttock, stage 1: Secondary | ICD-10-CM | POA: Diagnosis not present

## 2020-07-27 DIAGNOSIS — L89322 Pressure ulcer of left buttock, stage 2: Secondary | ICD-10-CM | POA: Diagnosis not present

## 2020-07-27 DIAGNOSIS — I350 Nonrheumatic aortic (valve) stenosis: Secondary | ICD-10-CM | POA: Diagnosis not present

## 2020-07-27 DIAGNOSIS — I130 Hypertensive heart and chronic kidney disease with heart failure and stage 1 through stage 4 chronic kidney disease, or unspecified chronic kidney disease: Secondary | ICD-10-CM | POA: Diagnosis not present

## 2020-07-27 DIAGNOSIS — I214 Non-ST elevation (NSTEMI) myocardial infarction: Secondary | ICD-10-CM | POA: Diagnosis not present

## 2020-07-27 DIAGNOSIS — E1122 Type 2 diabetes mellitus with diabetic chronic kidney disease: Secondary | ICD-10-CM | POA: Diagnosis not present

## 2020-07-27 DIAGNOSIS — I480 Paroxysmal atrial fibrillation: Secondary | ICD-10-CM | POA: Diagnosis not present

## 2020-07-31 DIAGNOSIS — L89311 Pressure ulcer of right buttock, stage 1: Secondary | ICD-10-CM | POA: Diagnosis not present

## 2020-07-31 DIAGNOSIS — I5033 Acute on chronic diastolic (congestive) heart failure: Secondary | ICD-10-CM | POA: Diagnosis not present

## 2020-07-31 DIAGNOSIS — N184 Chronic kidney disease, stage 4 (severe): Secondary | ICD-10-CM | POA: Diagnosis not present

## 2020-07-31 DIAGNOSIS — E1151 Type 2 diabetes mellitus with diabetic peripheral angiopathy without gangrene: Secondary | ICD-10-CM | POA: Diagnosis not present

## 2020-07-31 DIAGNOSIS — I214 Non-ST elevation (NSTEMI) myocardial infarction: Secondary | ICD-10-CM | POA: Diagnosis not present

## 2020-07-31 DIAGNOSIS — I130 Hypertensive heart and chronic kidney disease with heart failure and stage 1 through stage 4 chronic kidney disease, or unspecified chronic kidney disease: Secondary | ICD-10-CM | POA: Diagnosis not present

## 2020-07-31 DIAGNOSIS — E1122 Type 2 diabetes mellitus with diabetic chronic kidney disease: Secondary | ICD-10-CM | POA: Diagnosis not present

## 2020-07-31 DIAGNOSIS — Z466 Encounter for fitting and adjustment of urinary device: Secondary | ICD-10-CM | POA: Diagnosis not present

## 2020-07-31 DIAGNOSIS — R338 Other retention of urine: Secondary | ICD-10-CM | POA: Diagnosis not present

## 2020-07-31 DIAGNOSIS — L89322 Pressure ulcer of left buttock, stage 2: Secondary | ICD-10-CM | POA: Diagnosis not present

## 2020-07-31 DIAGNOSIS — I480 Paroxysmal atrial fibrillation: Secondary | ICD-10-CM | POA: Diagnosis not present

## 2020-07-31 DIAGNOSIS — I350 Nonrheumatic aortic (valve) stenosis: Secondary | ICD-10-CM | POA: Diagnosis not present

## 2020-08-01 ENCOUNTER — Encounter: Payer: Medicare Other | Admitting: Physical Therapy

## 2020-08-03 ENCOUNTER — Encounter: Payer: Medicare Other | Admitting: Physical Therapy

## 2020-08-08 ENCOUNTER — Encounter: Payer: Medicare Other | Admitting: Physical Therapy

## 2020-08-08 DIAGNOSIS — N184 Chronic kidney disease, stage 4 (severe): Secondary | ICD-10-CM | POA: Diagnosis not present

## 2020-08-08 DIAGNOSIS — L89311 Pressure ulcer of right buttock, stage 1: Secondary | ICD-10-CM | POA: Diagnosis not present

## 2020-08-08 DIAGNOSIS — I130 Hypertensive heart and chronic kidney disease with heart failure and stage 1 through stage 4 chronic kidney disease, or unspecified chronic kidney disease: Secondary | ICD-10-CM | POA: Diagnosis not present

## 2020-08-08 DIAGNOSIS — E1151 Type 2 diabetes mellitus with diabetic peripheral angiopathy without gangrene: Secondary | ICD-10-CM | POA: Diagnosis not present

## 2020-08-08 DIAGNOSIS — L89322 Pressure ulcer of left buttock, stage 2: Secondary | ICD-10-CM | POA: Diagnosis not present

## 2020-08-08 DIAGNOSIS — E1122 Type 2 diabetes mellitus with diabetic chronic kidney disease: Secondary | ICD-10-CM | POA: Diagnosis not present

## 2020-08-08 DIAGNOSIS — I214 Non-ST elevation (NSTEMI) myocardial infarction: Secondary | ICD-10-CM | POA: Diagnosis not present

## 2020-08-08 DIAGNOSIS — Z466 Encounter for fitting and adjustment of urinary device: Secondary | ICD-10-CM | POA: Diagnosis not present

## 2020-08-08 DIAGNOSIS — R338 Other retention of urine: Secondary | ICD-10-CM | POA: Diagnosis not present

## 2020-08-08 DIAGNOSIS — I480 Paroxysmal atrial fibrillation: Secondary | ICD-10-CM | POA: Diagnosis not present

## 2020-08-08 DIAGNOSIS — I350 Nonrheumatic aortic (valve) stenosis: Secondary | ICD-10-CM | POA: Diagnosis not present

## 2020-08-08 DIAGNOSIS — I5033 Acute on chronic diastolic (congestive) heart failure: Secondary | ICD-10-CM | POA: Diagnosis not present

## 2020-08-09 ENCOUNTER — Encounter (HOSPITAL_COMMUNITY): Payer: Self-pay | Admitting: *Deleted

## 2020-08-09 ENCOUNTER — Inpatient Hospital Stay (HOSPITAL_COMMUNITY)
Admission: EM | Admit: 2020-08-09 | Discharge: 2020-08-11 | DRG: 698 | Disposition: A | Discharge status: Home Health | Payer: Medicare Other | Attending: Internal Medicine | Admitting: Internal Medicine

## 2020-08-09 ENCOUNTER — Observation Stay (HOSPITAL_COMMUNITY): Payer: Medicare Other

## 2020-08-09 ENCOUNTER — Emergency Department (HOSPITAL_COMMUNITY): Payer: Medicare Other

## 2020-08-09 ENCOUNTER — Other Ambulatory Visit: Payer: Self-pay

## 2020-08-09 DIAGNOSIS — I130 Hypertensive heart and chronic kidney disease with heart failure and stage 1 through stage 4 chronic kidney disease, or unspecified chronic kidney disease: Secondary | ICD-10-CM | POA: Diagnosis present

## 2020-08-09 DIAGNOSIS — N179 Acute kidney failure, unspecified: Secondary | ICD-10-CM | POA: Diagnosis not present

## 2020-08-09 DIAGNOSIS — E860 Dehydration: Secondary | ICD-10-CM | POA: Diagnosis present

## 2020-08-09 DIAGNOSIS — I5032 Chronic diastolic (congestive) heart failure: Secondary | ICD-10-CM | POA: Diagnosis present

## 2020-08-09 DIAGNOSIS — Z8249 Family history of ischemic heart disease and other diseases of the circulatory system: Secondary | ICD-10-CM

## 2020-08-09 DIAGNOSIS — H409 Unspecified glaucoma: Secondary | ICD-10-CM | POA: Diagnosis present

## 2020-08-09 DIAGNOSIS — I482 Chronic atrial fibrillation, unspecified: Secondary | ICD-10-CM | POA: Diagnosis present

## 2020-08-09 DIAGNOSIS — Z823 Family history of stroke: Secondary | ICD-10-CM

## 2020-08-09 DIAGNOSIS — G319 Degenerative disease of nervous system, unspecified: Secondary | ICD-10-CM | POA: Diagnosis not present

## 2020-08-09 DIAGNOSIS — N3000 Acute cystitis without hematuria: Secondary | ICD-10-CM

## 2020-08-09 DIAGNOSIS — B965 Pseudomonas (aeruginosa) (mallei) (pseudomallei) as the cause of diseases classified elsewhere: Secondary | ICD-10-CM | POA: Diagnosis present

## 2020-08-09 DIAGNOSIS — J90 Pleural effusion, not elsewhere classified: Secondary | ICD-10-CM | POA: Diagnosis not present

## 2020-08-09 DIAGNOSIS — U071 COVID-19: Secondary | ICD-10-CM | POA: Diagnosis not present

## 2020-08-09 DIAGNOSIS — R410 Disorientation, unspecified: Secondary | ICD-10-CM | POA: Diagnosis not present

## 2020-08-09 DIAGNOSIS — Z89512 Acquired absence of left leg below knee: Secondary | ICD-10-CM

## 2020-08-09 DIAGNOSIS — Y846 Urinary catheterization as the cause of abnormal reaction of the patient, or of later complication, without mention of misadventure at the time of the procedure: Secondary | ICD-10-CM | POA: Diagnosis present

## 2020-08-09 DIAGNOSIS — E871 Hypo-osmolality and hyponatremia: Secondary | ICD-10-CM | POA: Diagnosis not present

## 2020-08-09 DIAGNOSIS — N184 Chronic kidney disease, stage 4 (severe): Secondary | ICD-10-CM | POA: Diagnosis present

## 2020-08-09 DIAGNOSIS — Z89511 Acquired absence of right leg below knee: Secondary | ICD-10-CM

## 2020-08-09 DIAGNOSIS — Z792 Long term (current) use of antibiotics: Secondary | ICD-10-CM

## 2020-08-09 DIAGNOSIS — E785 Hyperlipidemia, unspecified: Secondary | ICD-10-CM | POA: Diagnosis not present

## 2020-08-09 DIAGNOSIS — R4182 Altered mental status, unspecified: Secondary | ICD-10-CM

## 2020-08-09 DIAGNOSIS — G9341 Metabolic encephalopathy: Secondary | ICD-10-CM | POA: Diagnosis not present

## 2020-08-09 DIAGNOSIS — E876 Hypokalemia: Secondary | ICD-10-CM | POA: Diagnosis present

## 2020-08-09 DIAGNOSIS — L97509 Non-pressure chronic ulcer of other part of unspecified foot with unspecified severity: Secondary | ICD-10-CM | POA: Diagnosis present

## 2020-08-09 DIAGNOSIS — I480 Paroxysmal atrial fibrillation: Secondary | ICD-10-CM | POA: Diagnosis present

## 2020-08-09 DIAGNOSIS — I350 Nonrheumatic aortic (valve) stenosis: Secondary | ICD-10-CM | POA: Diagnosis not present

## 2020-08-09 DIAGNOSIS — N189 Chronic kidney disease, unspecified: Secondary | ICD-10-CM | POA: Diagnosis not present

## 2020-08-09 DIAGNOSIS — E1122 Type 2 diabetes mellitus with diabetic chronic kidney disease: Secondary | ICD-10-CM | POA: Diagnosis not present

## 2020-08-09 DIAGNOSIS — E1151 Type 2 diabetes mellitus with diabetic peripheral angiopathy without gangrene: Secondary | ICD-10-CM | POA: Diagnosis present

## 2020-08-09 DIAGNOSIS — L89152 Pressure ulcer of sacral region, stage 2: Secondary | ICD-10-CM | POA: Diagnosis present

## 2020-08-09 DIAGNOSIS — E11621 Type 2 diabetes mellitus with foot ulcer: Secondary | ICD-10-CM | POA: Diagnosis present

## 2020-08-09 DIAGNOSIS — E039 Hypothyroidism, unspecified: Secondary | ICD-10-CM | POA: Diagnosis present

## 2020-08-09 DIAGNOSIS — Z66 Do not resuscitate: Secondary | ICD-10-CM | POA: Diagnosis not present

## 2020-08-09 DIAGNOSIS — E11649 Type 2 diabetes mellitus with hypoglycemia without coma: Secondary | ICD-10-CM | POA: Diagnosis not present

## 2020-08-09 DIAGNOSIS — Z87440 Personal history of urinary (tract) infections: Secondary | ICD-10-CM

## 2020-08-09 DIAGNOSIS — M1990 Unspecified osteoarthritis, unspecified site: Secondary | ICD-10-CM | POA: Diagnosis present

## 2020-08-09 DIAGNOSIS — I6782 Cerebral ischemia: Secondary | ICD-10-CM | POA: Diagnosis not present

## 2020-08-09 DIAGNOSIS — Z7901 Long term (current) use of anticoagulants: Secondary | ICD-10-CM

## 2020-08-09 DIAGNOSIS — Z79899 Other long term (current) drug therapy: Secondary | ICD-10-CM

## 2020-08-09 DIAGNOSIS — I10 Essential (primary) hypertension: Secondary | ICD-10-CM | POA: Diagnosis not present

## 2020-08-09 DIAGNOSIS — T83511A Infection and inflammatory reaction due to indwelling urethral catheter, initial encounter: Principal | ICD-10-CM | POA: Diagnosis present

## 2020-08-09 DIAGNOSIS — Z7984 Long term (current) use of oral hypoglycemic drugs: Secondary | ICD-10-CM

## 2020-08-09 DIAGNOSIS — I252 Old myocardial infarction: Secondary | ICD-10-CM

## 2020-08-09 DIAGNOSIS — Z79890 Hormone replacement therapy (postmenopausal): Secondary | ICD-10-CM

## 2020-08-09 DIAGNOSIS — Z87891 Personal history of nicotine dependence: Secondary | ICD-10-CM

## 2020-08-09 LAB — C-REACTIVE PROTEIN: CRP: 1.4 mg/dL — ABNORMAL HIGH (ref <1.0)

## 2020-08-09 LAB — FERRITIN: Ferritin: 404 ng/mL — ABNORMAL HIGH (ref 24–336)

## 2020-08-09 LAB — CBG MONITORING, ED
Glucose-Capillary: 30 mg/dL — CL (ref 70–99)
Glucose-Capillary: 32 mg/dL — CL (ref 70–99)
Glucose-Capillary: 142 mg/dL — ABNORMAL HIGH (ref 70–99)
Glucose-Capillary: 70 mg/dL (ref 70–99)

## 2020-08-09 LAB — CBC WITH DIFFERENTIAL/PLATELET
Abs Immature Granulocytes: 0.03 10*3/uL (ref 0.00–0.07)
Basophils Absolute: 0 10*3/uL (ref 0.0–0.1)
Basophils Relative: 0 %
Eosinophils Absolute: 0 10*3/uL (ref 0.0–0.5)
Eosinophils Relative: 0 %
HCT: 38 % — ABNORMAL LOW (ref 39.0–52.0)
Hemoglobin: 12.9 g/dL — ABNORMAL LOW (ref 13.0–17.0)
Immature Granulocytes: 0 %
Lymphocytes Relative: 22 %
Lymphs Abs: 1.7 10*3/uL (ref 0.7–4.0)
MCH: 33.5 pg (ref 26.0–34.0)
MCHC: 33.9 g/dL (ref 30.0–36.0)
MCV: 98.7 fL (ref 80.0–100.0)
Monocytes Absolute: 1.2 10*3/uL — ABNORMAL HIGH (ref 0.1–1.0)
Monocytes Relative: 15 %
Neutro Abs: 5 10*3/uL (ref 1.7–7.7)
Neutrophils Relative %: 63 %
Platelets: 162 10*3/uL (ref 150–400)
RBC: 3.85 MIL/uL — ABNORMAL LOW (ref 4.22–5.81)
RDW: 14.3 % (ref 11.5–15.5)
WBC: 8 10*3/uL (ref 4.0–10.5)
nRBC: 0 % (ref 0.0–0.2)

## 2020-08-09 LAB — URINALYSIS, ROUTINE W REFLEX MICROSCOPIC
Bilirubin Urine: NEGATIVE
Glucose, UA: NEGATIVE mg/dL
Hgb urine dipstick: NEGATIVE
Ketones, ur: NEGATIVE mg/dL
Nitrite: NEGATIVE
Protein, ur: 100 mg/dL — AB
Specific Gravity, Urine: 1.015 (ref 1.005–1.030)
WBC, UA: >50 WBC/hpf — ABNORMAL HIGH (ref 0–5)
pH: 5 (ref 5.0–8.0)

## 2020-08-09 LAB — FIBRINOGEN: Fibrinogen: 418 mg/dL (ref 210–475)

## 2020-08-09 LAB — COMPREHENSIVE METABOLIC PANEL
ALT: 22 U/L (ref 0–44)
AST: 45 U/L — ABNORMAL HIGH (ref 15–41)
Albumin: 3.3 g/dL — ABNORMAL LOW (ref 3.5–5.0)
Alkaline Phosphatase: 35 U/L — ABNORMAL LOW (ref 38–126)
Anion gap: 10 (ref 5–15)
BUN: 45 mg/dL — ABNORMAL HIGH (ref 8–23)
CO2: 24 mmol/L (ref 22–32)
Calcium: 8.2 mg/dL — ABNORMAL LOW (ref 8.9–10.3)
Chloride: 97 mmol/L — ABNORMAL LOW (ref 98–111)
Creatinine, Ser: 3.5 mg/dL — ABNORMAL HIGH (ref 0.61–1.24)
GFR, Estimated: 16 mL/min — ABNORMAL LOW (ref >60)
Glucose, Bld: 114 mg/dL — ABNORMAL HIGH (ref 70–99)
Potassium: 3.2 mmol/L — ABNORMAL LOW (ref 3.5–5.1)
Sodium: 131 mmol/L — ABNORMAL LOW (ref 135–145)
Total Bilirubin: 0.6 mg/dL (ref 0.3–1.2)
Total Protein: 7.3 g/dL (ref 6.5–8.1)

## 2020-08-09 LAB — D-DIMER, QUANTITATIVE: D-Dimer, Quant: 1.94 ug/mL-FEU — ABNORMAL HIGH (ref 0.00–0.50)

## 2020-08-09 LAB — LACTATE DEHYDROGENASE: LDH: 134 U/L (ref 98–192)

## 2020-08-09 LAB — RESP PANEL BY RT-PCR (FLU A&B, COVID) ARPGX2
Influenza A by PCR: NEGATIVE
Influenza B by PCR: NEGATIVE
SARS Coronavirus 2 by RT PCR: POSITIVE — AB

## 2020-08-09 LAB — PROCALCITONIN: Procalcitonin: <0.1 ng/mL

## 2020-08-09 MED ORDER — DEXTROSE 50 % IV SOLN: 50.0000 mL | Freq: Once | INTRAVENOUS | Status: DC

## 2020-08-09 MED ORDER — ONDANSETRON HCL 4 MG/2ML IJ SOLN: 4.0000 mg | Freq: Four times a day (QID) | INTRAMUSCULAR | Status: DC | PRN

## 2020-08-09 MED ORDER — POLYETHYLENE GLYCOL 3350 17 G PO PACK: 17.0000 g | PACK | Freq: Every day | ORAL | Status: DC | PRN

## 2020-08-09 MED ORDER — POLYETHYLENE GLYCOL 3350 17 G PO PACK
17.0000 g | PACK | Freq: Two times a day (BID) | ORAL | Status: AC
  Administered 2020-08-11: 17 g via ORAL
  Filled 2020-08-09 (×2): qty 1

## 2020-08-09 MED ORDER — CARVEDILOL 3.125 MG PO TABS
3.1250 mg | ORAL_TABLET | Freq: Two times a day (BID) | ORAL | Status: DC
  Administered 2020-08-10 – 2020-08-11 (×2): 3.125 mg via ORAL
  Filled 2020-08-09 (×3): qty 1

## 2020-08-09 MED ORDER — DEXTROSE 50 % IV SOLN
50.0000 mL | Freq: Once | INTRAVENOUS | Status: AC
  Filled 2020-08-09: qty 50

## 2020-08-09 MED ORDER — INSULIN ASPART 100 UNIT/ML IJ SOLN: 0.0000 [IU] | Freq: Four times a day (QID) | INTRAMUSCULAR | Status: DC

## 2020-08-09 MED ORDER — ONDANSETRON HCL 4 MG PO TABS: 4.0000 mg | ORAL_TABLET | Freq: Four times a day (QID) | ORAL | Status: DC | PRN

## 2020-08-09 MED ORDER — SODIUM CHLORIDE 0.9 % IV SOLN
2.0000 g | INTRAVENOUS | Status: DC
  Administered 2020-08-09 – 2020-08-10 (×2): 2 g via INTRAVENOUS
  Filled 2020-08-09 (×2): qty 2

## 2020-08-09 MED ORDER — ACETAMINOPHEN 650 MG RE SUPP: 650.0000 mg | Freq: Four times a day (QID) | RECTAL | Status: DC | PRN

## 2020-08-09 MED ORDER — AMLODIPINE BESYLATE 5 MG PO TABS
5.0000 mg | ORAL_TABLET | Freq: Every day | ORAL | Status: DC
  Administered 2020-08-10 – 2020-08-11 (×2): 5 mg via ORAL
  Filled 2020-08-09 (×3): qty 1

## 2020-08-09 MED ORDER — DOXAZOSIN MESYLATE 2 MG PO TABS
2.0000 mg | ORAL_TABLET | Freq: Every day | ORAL | Status: DC
  Administered 2020-08-10 – 2020-08-11 (×2): 2 mg via ORAL
  Filled 2020-08-09 (×3): qty 1

## 2020-08-09 MED ORDER — SODIUM CHLORIDE 0.9 % IV BOLUS
500.0000 mL | Freq: Once | INTRAVENOUS | Status: AC
  Administered 2020-08-09: 500 mL via INTRAVENOUS

## 2020-08-09 MED ORDER — DEXTROSE 50 % IV SOLN
INTRAVENOUS | Status: AC
  Administered 2020-08-09: 50 mL via INTRAVENOUS
  Filled 2020-08-09: qty 50

## 2020-08-09 MED ORDER — ACETAMINOPHEN 325 MG PO TABS: 650.0000 mg | ORAL_TABLET | Freq: Four times a day (QID) | ORAL | Status: DC | PRN

## 2020-08-09 MED ORDER — KCL IN DEXTROSE-NACL 20-5-0.9 MEQ/L-%-% IV SOLN
INTRAVENOUS | Status: AC
  Filled 2020-08-09 (×4): qty 1000

## 2020-08-09 MED ORDER — LEVOTHYROXINE SODIUM 75 MCG PO TABS
75.0000 ug | ORAL_TABLET | Freq: Every day | ORAL | Status: DC
  Administered 2020-08-10 – 2020-08-11 (×2): 75 ug via ORAL
  Filled 2020-08-09: qty 1
  Filled 2020-08-09: qty 2

## 2020-08-09 MED ORDER — POTASSIUM CHLORIDE IN NACL 20-0.9 MEQ/L-% IV SOLN
INTRAVENOUS | Status: DC
  Filled 2020-08-09: qty 1000

## 2020-08-09 MED ORDER — CIPROFLOXACIN IN D5W 400 MG/200ML IV SOLN
400.0000 mg | Freq: Once | INTRAVENOUS | Status: AC
  Administered 2020-08-09: 400 mg via INTRAVENOUS
  Filled 2020-08-09: qty 200

## 2020-08-09 MED ORDER — LEVOTHYROXINE SODIUM 50 MCG PO TABS: 75.0000 ug | ORAL_TABLET | Freq: Every day | ORAL | Status: DC

## 2020-08-09 MED ORDER — POTASSIUM CHLORIDE 20 MEQ PO PACK
40.0000 meq | PACK | Freq: Once | ORAL | Status: DC
  Filled 2020-08-09: qty 2

## 2020-08-09 MED ORDER — CLOPIDOGREL BISULFATE 75 MG PO TABS
75.0000 mg | ORAL_TABLET | Freq: Every day | ORAL | Status: DC
  Administered 2020-08-10 – 2020-08-11 (×2): 75 mg via ORAL
  Filled 2020-08-09 (×3): qty 1

## 2020-08-09 MED ORDER — APIXABAN 2.5 MG PO TABS
2.5000 mg | ORAL_TABLET | Freq: Two times a day (BID) | ORAL | Status: DC
  Administered 2020-08-10 – 2020-08-11 (×3): 2.5 mg via ORAL
  Filled 2020-08-09 (×3): qty 1

## 2020-08-09 NOTE — H&P (Addendum)
History and Physical    [name]:[id number] DOB: 1927/01/21 DOA: 08/09/2020  PCP: Redmond School, MD   Patient coming from: Home  I have personally briefly reviewed patient's old medical records in [place]  Chief Complaint: AMS  HPI: [name] is a 87 y.o. male with medical history significant for chronic indwelling Foley catheter, atrial fibrillation, diastolic CHF, diabetes mellitus bilateral BKA, CKD 4. Patient was brought to the ED reports of change in mental status that started yesterday.  Patient's son reports patient has been sleeping more.  He is not very active, as he has bilateral below-knee amputations.  Son has noticed productive cough here in the ED.  Reports chills at home. No difficulty breathing.  No vomiting no loose stools, patient has had poor oral intake over the past 2 days. Patient got 2 doses of COVID-19 vaccine.  No booster. Patient has a chronic pressure ulcer on his buttock that is unchanged, and has been present for at least a year.  No purulence.   Mental status has improved in the ED since fluids were given.  ED Course: T-max 99.3.  Heart rate 86-110.  Respirate rate 18-20.  Blood pressure 120s to 140s.  O2 sats greater than 95% on room air.  WBC 8.  UA with large leukocytes greater than 50 WBCs.  COVID test positive.  Creatinine elevated 3.5. Head CT without acute abnormality. IV ciprofloxacin started in ED. 500 mill bolus given.  Hospitalist to admit.  Review of Systems: As per HPI all other systems reviewed and negative.  Past Medical History:  Diagnosis Date   Arthritis    Borderline diabetic    CHF (congestive heart failure) (HCC)    Chronic kidney disease    Diabetes mellitus without complication ([place])    Dysrhythmia    Glaucoma    Hyperlipidemia    Hypertension    Hypothyroidism    Myocardial infarction [facility])    PVD (peripheral vascular disease) ([place])    Sleep apnea    Cpap ordered but doesnt use   Urinary retention  due to benign prostatic hyperplasia 12/28/2018    Past Surgical History:  Procedure Laterality Date   ABDOMINAL AORTOGRAM W/LOWER EXTREMITY N/A 07/27/2018   Procedure: ABDOMINAL AORTOGRAM W/LOWER EXTREMITY;  Surgeon: Lorretta Harp, MD;  Location: [place] [facility];  Service: Cardiovascular;  Laterality: N/A;   AMPUTATION Right 08/28/2018   Procedure: RIGHT BELOW KNEE AMPUTATION;  Surgeon: [name], MD;  Location: [place];  Service: Orthopedics;  Laterality: Right;   AMPUTATION Left 12/02/2018   Procedure: LEFT TRANSMETATARSAL AMPUTATION AND NEGATIVE PRESSURE WOUND VAC PLACEMENT;  Surgeon: [name], MD;  Location: [place];  Service: Orthopedics;  Laterality: Left;   AMPUTATION Left 12/18/2018   Procedure: LEFT BELOW KNEE AMPUTATION;  Surgeon: [name], MD;  Location: [place];  Service: Orthopedics;  Laterality: Left;   APPENDECTOMY  1943   BELOW KNEE LEG AMPUTATION Left 12/18/2018   CATARACT EXTRACTION  2012   x2   COLONOSCOPY N/A 11/01/2014   Procedure: COLONOSCOPY;  Surgeon: Aviva Signs, MD;  [facility];  Service: Gastroenterology;  Laterality: N/A;   ESOPHAGOGASTRODUODENOSCOPY N/A 11/01/2014   Procedure: ESOPHAGOGASTRODUODENOSCOPY (EGD);  Surgeon: Aviva Signs, MD;  [facility];  Service: Gastroenterology;  Laterality: N/A;   HERNIA REPAIR Left    INGUINAL HERNIA REPAIR Right 03/01/2013   Procedure: RIGHT INGUINAL HERNIORRHAPHY;  Surgeon: Jamesetta So, MD;  Location: [facility];  Service:  General;  Laterality: Right;   INSERTION OF MESH Right 03/01/2013   Procedure: INSERTION OF MESH;  Surgeon: Jamesetta So, MD;  [facility];  Service: General;  Laterality: Right;   LOWER EXTREMITY ANGIOGRAPHY Right 08/25/2018   Procedure: LOWER EXTREMITY ANGIOGRAPHY;  Surgeon: Wellington Hampshire, MD;  Location: [place] [facility];  Service: Cardiovascular;  Laterality: Right;   LOWER EXTREMITY ANGIOGRAPHY N/A 11/25/2018   Procedure: LOWER EXTREMITY ANGIOGRAPHY;   Surgeon: Wellington Hampshire, MD;  Location: [place] [facility];  Service: Cardiovascular;  Laterality: N/A;   NM MYOCAR PERF WALL MOTION  06/05/2010   no significant ischemia   PERIPHERAL VASCULAR ATHERECTOMY Right 08/03/2018   Procedure: PERIPHERAL VASCULAR ATHERECTOMY;  Surgeon: Lorretta Harp, MD;  Location: [place] [facility];  Service: Cardiovascular;  Laterality: Right;   PERIPHERAL VASCULAR ATHERECTOMY Left 11/23/2018   Procedure: PERIPHERAL VASCULAR ATHERECTOMY;  Surgeon: Lorretta Harp, MD;  Location: [place] [facility];  Service: Cardiovascular;  Laterality: Left;  sfa with dc balloon   PERIPHERAL VASCULAR BALLOON ANGIOPLASTY Left 11/25/2018   Procedure: PERIPHERAL VASCULAR BALLOON ANGIOPLASTY;  Surgeon: Wellington Hampshire, MD;  Location: [place] [facility];  Service: Cardiovascular;  Laterality: Left;  popliteal   PERIPHERAL VASCULAR INTERVENTION Left 11/25/2018   Procedure: PERIPHERAL VASCULAR INTERVENTION;  Surgeon: Wellington Hampshire, MD;  Location: [place] [facility];  Service: Cardiovascular;  Laterality: Left;  tibial peroneal trunk and peroneal stents    stent  06/14/2010   left leg   STUMP REVISION Left 01/20/2019   Procedure: REVISION LEFT BELOW KNEE AMPUTATION;  Surgeon: [name], MD;  Location: [place];  Service: Orthopedics;  Laterality: Left;   US ECHOCARDIOGRAPHY  01/21/2006   moderate mitral annular ca+, mild MR, AOV moderately sclerotic.     reports that he has quit smoking. He has quit using smokeless tobacco.  His smokeless tobacco use included chew. He reports that he does not drink alcohol and does not use drugs.  Allergies  Allergen Reactions   Iodinated Diagnostic Agents Other (See Comments)    caused Fever   Dye Fdc Red [Red Dye] Hives    Tolerated red Docusate, Niferex   Hydralazine     Family History  Problem Relation Age of Onset   Cancer Mother    Heart attack Father    Stroke Father    Heart attack Brother    Heart attack Brother      Prior to Admission medications   Medication Sig Start Date End Date Taking? Authorizing Provider  acetaminophen (TYLENOL) 500 MG tablet Take 500 mg by mouth every 6 (six) hours as needed for headache (pain).   Yes [provider]  amLODipine (NORVASC) 10 MG tablet Take 1 tablet (10 mg total) by mouth daily. 11/16/19 08/09/20 Yes Hilty, Nadean Corwin, MD  amLODipine (NORVASC) 5 MG tablet Take 5 mg by mouth daily. 07/24/20  Yes [provider]  apixaban (ELIQUIS) 2.5 MG TABS tablet Take 1 tablet (2.5 mg total) by mouth 2 (two) times daily. 06/19/20  Yes Barrett, Evelene Croon, PA-C  Ascorbic Acid (VITAMIN C) 1000 MG tablet Take 1,000 mg by mouth daily.    Yes [provider]  atorvastatin (LIPITOR) 40 MG tablet TAKE 1 TABLET(40 MG) BY MOUTH EVERY EVENING 07/19/20  Yes Hilty, Nadean Corwin, MD  betamethasone dipropionate 0.05 % cream Apply 1 application topically 2 (two) times daily. 12/03/19  Yes [provider]  carvedilol (COREG) 3.125 MG tablet Take 1  tablet (3.125 mg total) by mouth 2 (two) times daily with a meal. 06/19/20  Yes Barrett, Evelene Croon, PA-C  cephALEXin (KEFLEX) 250 MG capsule Take 250 mg by mouth daily. 03/06/20  Yes [provider]  Cholecalciferol (VITAMIN D-3) 125 MCG (5000 UT) TABS Take 5,000 Units by mouth every evening.   Yes [provider]  clopidogrel (PLAVIX) 75 MG tablet Take 1 tablet (75 mg total) by mouth daily with breakfast. 12/03/19  Yes Hilty, Nadean Corwin, MD  co-enzyme Q-10 30 MG capsule Take 100 mg by mouth daily.   Yes [provider]  doxazosin (CARDURA) 2 MG tablet Take 1 tablet (2 mg total) by mouth daily. 06/19/20  Yes Barrett, Evelene Croon, PA-C  famotidine (PEPCID) 20 MG tablet Take 1 tablet (20 mg total) by mouth daily. Patient taking differently: Take 20 mg by mouth 2 (two) times daily. 11/12/18  Yes Johnson, Clanford L, MD  ferrous sulfate 325 (65 FE) MG tablet Take 325 mg by mouth 2 (two) times daily with a meal.   Yes  [provider]  furosemide (LASIX) 40 MG tablet Take 2 tablets (80 mg total) by mouth daily. Take 2 tablets (80mg ) once daily in the morning. Can take an additional 1 tablet (40mg ) in the afternoon as needed for weight gain (3lbs in 1 day or 5lbs in 1 week). 06/19/20 09/17/20 Yes Barrett, Evelene Croon, PA-C  glipiZIDE (GLUCOTROL) 10 MG tablet Take 10 mg by mouth 2 (two) times daily.   Yes [provider]  latanoprost (XALATAN) 0.005 % ophthalmic solution Place 1 drop into both eyes at bedtime. 09/30/19  Yes [provider]  levothyroxine (SYNTHROID) 75 MCG tablet Take 75 mcg by mouth daily before breakfast.  02/12/19  Yes [provider]  polyethylene glycol (MIRALAX) 17 g packet Take 17 g by mouth daily as needed for moderate constipation. 12/09/18  Yes Mercy Riding, MD  potassium chloride SA (KLOR-CON) 20 MEQ tablet Take 2 tablets (40 mEq total) by mouth daily. 06/20/20  Yes Barrett, Evelene Croon, PA-C  pyridoxine (B-6) 100 MG tablet Take 100 mg by mouth daily.   Yes [provider]  QUERCETIN PO Take 1,000 mg by mouth daily.   Yes [provider]  traMADol (ULTRAM) 50 MG tablet Take 1 tablet (50 mg total) by mouth every 6 (six) hours as needed. Patient taking differently: Take 50 mg by mouth every 6 (six) hours as needed for moderate pain. 02/15/19  Yes Lovorn, Megan, MD  vitamin E 400 UNIT capsule Take 400 Units by mouth daily.   Yes [provider]  zinc gluconate 50 MG tablet Take 50 mg by mouth daily.   Yes [provider]  ciprofloxacin (CIPRO) 250 MG tablet Take 250 mg by mouth daily. Patient not taking: Reported on 08/09/2020    [provider]  nitroGLYCERIN (NITROSTAT) 0.4 MG SL tablet Place 1 tablet (0.4 mg total) under the tongue every 5 (five) minutes x 3 doses as needed for chest pain. 06/19/20   Barrett, Evelene Croon, PA-C  ONETOUCH ULTRA test strip 1 each 2 (two) times daily. 04/15/19   [provider]    Physical  Exam: Vitals:   08/09/20 1408 08/09/20 1409 08/09/20 1530 08/09/20 1600  BP: (!) 134/102  (!) 141/100 127/68  Pulse: 95  (!) 110 86  Resp: 18  20   Temp: 99.3 F (37.4 C)     TempSrc: Oral     SpO2: 95%  97% 95%  Weight:  98 kg    Height:  5\' 11"  (1.803 m)      Constitutional: Appears lethargic, but arouses to voice, follows directions, calm, comfortable Vitals:   08/09/20 1408 08/09/20 1409 08/09/20 1530 08/09/20 1600  BP: (!) 134/102  (!) 141/100 127/68  Pulse: 95  (!) 110 86  Resp: 18  20   Temp: 99.3 F (37.4 C)     TempSrc: Oral     SpO2: 95%  97% 95%  Weight:  98 kg    Height:  5\' 11"  (1.803 m)     Eyes: PERRL, lids and conjunctivae normal ENMT: Mucous membranes are dry  Neck: normal, supple, no masses, no thyromegaly Respiratory: clear to auscultation bilaterally, no wheezing, no crackles. Normal respiratory effort. No accessory muscle use.  Cardiovascular: Regular rate and rhythm, 3/6 known systolic murmurs / rubs / gallops. No extremity edema. 2+ pedal pulses.  Abdomen: no tenderness, no masses palpated. No hepatosplenomegaly. Bowel sounds positive.  Folely catheter in place. Musculoskeletal: Bilateral below-knee amputations.  No clubbing / cyanosis. No joint deformity upper and lower extremities. Good ROM, no contractures.  Skin: no rashes, lesions, ulcers. No induration Neurologic: Awake, appears lethargic.  4/5 strength in all extremities. Psychiatric: Awake, lethargic, and oriented x 2.  Labs on Admission: I have personally reviewed following labs and imaging studies  CBC: Recent Labs  Lab 08/09/20 1447  WBC 8.0  NEUTROABS 5.0  HGB 12.9*  HCT 38.0*  MCV 98.7  PLT 371   Basic Metabolic Panel: Recent Labs  Lab 08/09/20 1447  NA 131*  K 3.2*  CL 97*  CO2 24  GLUCOSE 114*  BUN 45*  CREATININE 3.50*  CALCIUM 8.2*   Liver Function Tests: Recent Labs  Lab 08/09/20 1447  AST 45*  ALT 22  ALKPHOS 35*  BILITOT 0.6  PROT 7.3  ALBUMIN 3.3*    Urine analysis:    Component Value Date/Time   COLORURINE YELLOW 08/09/2020 1446   APPEARANCEUR CLOUDY (A) 08/09/2020 1446   LABSPEC 1.015 08/09/2020 1446   PHURINE 5.0 08/09/2020 1446   GLUCOSEU NEGATIVE 08/09/2020 1446   HGBUR NEGATIVE 08/09/2020 1446   BILIRUBINUR NEGATIVE 08/09/2020 1446   KETONESUR NEGATIVE 08/09/2020 1446   PROTEINUR 100 (A) 08/09/2020 1446   NITRITE NEGATIVE 08/09/2020 1446   LEUKOCYTESUR LARGE (A) 08/09/2020 1446    Radiological Exams on Admission: CT Head Wo Contrast  Result Date: 08/09/2020 CLINICAL DATA:  Delirium. EXAM: CT HEAD WITHOUT CONTRAST TECHNIQUE: Contiguous axial images were obtained from the base of the skull through the vertex without intravenous contrast. COMPARISON:  February 24, 2020. FINDINGS: Brain: No evidence of acute large vascular territory infarction, hemorrhage, hydrocephalus, or mass lesion/mass effect. Patchy white matter hypoattenuation, most likely related to chronic microvascular ischemic disease. Atrophy with ex vacuo ventricular dilation. Vascular: Calcific intracranial atherosclerosis. No hyperdense vessel identified. Skull: No acute fracture. Sinuses/Orbits: Retention cyst versus polyp in the inferior left maxillary sinus. Otherwise, sinuses are largely clear. No acute orbital abnormality. Other: No mastoid effusions. IMPRESSION: 1. Similar versus slightly progressed diffuse pachymeningeal thickening versus thin subdural collections, better characterized on prior MRI. No significant mass effect. 2. Otherwise, no evidence of acute intracranial abnormality. 3. Chronic microvascular ischemic disease and atrophy. Electronically Signed   By: Margaretha Sheffield MD   On: 08/09/2020 15:36    EKG: Independently reviewed.  Atrial fibrillation rate 80.  QTc 454.   No significant ST or T wave changes.  Assessment/Plan Principal Problem:   Acute metabolic encephalopathy  Active Problems:   COVID-19 virus infection   Moderate aortic  stenosis   Chronic diastolic CHF (congestive heart failure) (HCC)   Atrial fibrillation, chronic   Type 2 diabetes mellitus with foot ulcer, without long-term current use of insulin (HCC)   Acute kidney injury superimposed on CKD (HCC)   CKD (chronic kidney disease) stage 4, GFR 15-29 ml/min (HCC)   Acute metabolic encephalopathy-likely secondary to COVID-19 infection, possible urinary tract infection, also appears dehydrated with AKI.  Head CT without acute abnormality. -500 mill bolus given, continue D5 N/s + 20 Kcl 75cc/hr x 20hrs  COVID-19 infection-productive cough in ED, otherwise no dyspnea, O2 sats greater than 94% on room air.  Chest x-ray shows left lung base atelectasis or infiltrate.  Vaccinated x2.  No booster. -Patient's son is at bedside, he has declined Paxlovid or remdesivir, he is okay with steroids only. -Patient is not dyspneic, not hypoxia, chest x-ray findings not impressive, steroids held for now.   -Obtain and trend inflammatory markers  AKI on CKD-3.5, baseline over the past year has ranged from 2.5-3.1. -Hydrate -Hold Lasix 80 mg daily  Probable UTI, with chronic indwelling Foley catheter-patient may be colonized, but with altered mental status will treat as UTI.  UA suggestive of UTI-large leuks, > 50 WBC..  Patient does not meet sepsis criteria.  Patient has been taking Keflex 250mg  daily prophylactically for UTI.  Last urine cultures grew Pseudomonas sensitive to ceftazidime. -Consider changing Foley prior to discharge -IV cefepime -Follow-up urine cultures  Electrolyte abnormalities-hyponatremia sodium 131, hypokalemia potassium 3.2.  Likely from dehydration from poor oral intake and Lasix. -Hydrate and replete  Paroxysmal atrial fibrillation-rate controlled, and on anticoagulation. - Resume eliquis, carvedilol  Diabetes mellitus-random glucose 114 > 30. - Hgba1c -Hold glipizide - CBG q2 hr x 6 - Add dextrose- D5 to fluids.  Diastolic CHF- Stable and  compensated.  Appears dehydrated.  Last echo 06/2020 EF 65 to 70% with grade 2 DD. - Hold lasix  Moderate aortic stenosis-recent echo 06/2020 shows severe calcification of aortic valve, with moderate to severe stenosis.  Bilateral below-knee amputation  Pressure ulcer -Wound care consult  DVT prophylaxis: Eliquis Code Status: Full code Family Communication: Son - [name].  Disposition Plan: ~2 days Consults called: None Admission status: Inpt, tele I certify that at the point of admission it is my clinical judgment that the patient will require inpatient hospital care spanning beyond 2 midnights from the point of admission due to high intensity of service, high risk for further deterioration and high frequency of surveillance required.    Bethena Roys MD [facility]  08/09/2020, 7:44 PM

## 2020-08-09 NOTE — ED Notes (Signed)
Family member leaving. States he will come back tomorrow. To call if there are any updates

## 2020-08-09 NOTE — Progress Notes (Signed)
Pharmacy Antibiotic Note  [name] is a 87 y.o. male admitted on 08/09/2020 with UTI.  Pharmacy has been consulted for Cefepime dosing.  Plan: Cefepime 2 g IV every 24 hours Monitor clinical progress, cultures/sensitivities, renal function, abx plan   Height: 5\' 11"  (180.3 cm) Weight: 98 kg (216 lb) IBW/kg (Calculated) : 75.3  Temp (24hrs), Avg:99.3 F (37.4 C), Min:99.3 F (37.4 C), Max:99.3 F (37.4 C)  Recent Labs  Lab 08/09/20 1447  WBC 8.0  CREATININE 3.50*    Estimated Creatinine Clearance: 16.7 mL/min (A) (by C-G formula based on SCr of 3.5 mg/dL (H)).    Allergies  Allergen Reactions   Iodinated Diagnostic Agents Other (See Comments)    caused Fever   Dye Fdc Red [Red Dye] Hives    Tolerated red Docusate, Niferex   Hydralazine     Antimicrobials this admission: 7/27 Cipro x 1 7/27 Cefepime >>   Dose adjustments this admission:   Microbiology results: COVID positive 7/27 UCx: in process    Thank you for allowing [place] to participate in this patients care. Jens Som, PharmD 08/09/2020 7:58 PM  Please check [email or web address] for unit-specific pharmacy phone numbers.

## 2020-08-09 NOTE — ED Provider Notes (Signed)
[facility] EMERGENCY DEPARTMENT Provider Note   CSN: [id number] Arrival date & time: 08/09/20  1355     History Chief Complaint  Patient presents with   Altered Mental Status    [name] is a 87 y.o. male with past medical history significant for CHF, diabetes, bilateral BKA's, chronic indwelling Foley due to enlarged prostate who presents for evaluation of altered mental status.  Patient's son states patient has history of frequent urinary tract infections, has grown out MRSA previously and has been followed by ID.  He is currently on keflex for prophylaxis.  Son states patient has been more somnolent than normal.  States he has had decreased appetite, went to bed yesterday around 7 did not wake up till 10 AM this morning.  No facial droop, patient has denied any pain, no unilateral weakness, no seizure-like activity. Increase in urine output his morning had 1200cc urine in foley, day before did not have more than 1200 cc all day.  Left 5 caveat-altered mental status  52 of history from son in room  Per son ID has told him previously that Cipro typically worked for his antibiotic resistant UTIs.  HPI     Past Medical History:  Diagnosis Date   Arthritis    Borderline diabetic    CHF (congestive heart failure) (HCC)    Chronic kidney disease    Diabetes mellitus without complication ([place])    Dysrhythmia    Glaucoma    Hyperlipidemia    Hypertension    Hypothyroidism    Myocardial infarction [facility])    PVD (peripheral vascular disease) ([place])    Sleep apnea    Cpap ordered but doesnt use   Urinary retention due to benign prostatic hyperplasia 12/28/2018    Patient Active Problem List   Diagnosis Date Noted   Demand ischemia ([place]) 06/18/2020   Uncontrolled type 2 diabetes mellitus with hyperglycemia, without long-term current use of insulin ([place]) 02/25/2020   CKD (chronic kidney disease) stage 4, GFR 15-29 ml/min ([place]) 02/25/2020   Acute UTI    AMS (altered  mental status) 02/24/2020   PNA (pneumonia) 12/06/2019   Sepsis due to PNA 10/20/1217   Acute metabolic encephalopathy 75/88/3254   AKI (acute kidney injury) [facility])    Aortic valve stenosis    Palliative care encounter    Acute kidney injury superimposed on CKD ([place]) 10/04/2019   Hypokalemia 10/04/2019   Weakness generalized 04/05/2019   S/P BKA (below knee amputation) unilateral, left ([place]) 02/15/2019   Pressure injury of skin 02/03/2019   TIA (transient ischemic attack) 02/03/2019   Transient speech disturbance 02/02/2019   Chronic indwelling Foley catheter 01/22/2019   Postoperative wound infection 01/20/2019   Normocytic anemia 01/18/2019   Cellulitis 01/18/2019   Infection of deep incisional surgical site after procedure    Abnormal urinalysis    Labile blood glucose    Urinary retention    S/P bilateral BKA (below knee amputation) ([place])    Acute blood loss anemia    Urinary retention due to benign prostatic hyperplasia 12/28/2018   Unilateral complete BKA, left, initial encounter ([place]) 12/25/2018   Dehiscence of amputation stump ([place])    History of transmetatarsal amputation of left foot ([place]) 12/09/2018   Critical limb ischemia with history of revascularization of same extremity ([place])    Gangrene of left foot ([place])    Diabetic ulcer of toe of left foot associated with type 2 diabetes mellitus ([place])    Cellulitis of left lower extremity  Toe infection 11/18/2018   Peripheral edema    Acute on chronic diastolic CHF (congestive heart failure) ([place]) 11/09/2018   S/P BKA (below knee amputation) unilateral, right ([place]) 10/05/2018   Hyponatremia    Essential hypertension    Postoperative pain    Pain of left heel    Unable to maintain weight-bearing    Type 2 diabetes mellitus with diabetic peripheral angiopathy with gangrene ([place])    Anemia due to acute blood loss    Chronic kidney disease (CKD), stage IV (severe) (HCC)    Acquired absence of right leg below knee ([place])  09/07/2018   Gangrene of right foot ([place])    Goals of care, counseling/discussion    Palliative care by specialist    Type 2 diabetes mellitus with vascular disease ([place])    Severe protein-calorie malnutrition ([place])    Ischemic foot 08/20/2018   Critical lower limb ischemia ([place]) 08/03/2018   Type 2 diabetes mellitus with foot ulcer, without long-term current use of insulin (HCC)    Gastroesophageal reflux disease    Cellulitis of great toe of right foot 05/29/2018   Atrial fibrillation, chronic    Chest pain 07/25/2017   PAF (paroxysmal atrial fibrillation) ([place]) 07/25/2017   BPH (benign prostatic hyperplasia) 07/25/2017   Shortness of breath 05/11/2017   Hypothyroidism 05/11/2017   Chronic diastolic CHF (congestive heart failure) ([place]) 05/11/2017   Moderate aortic stenosis 06/12/2016   RBBB 06/12/2016   Peripheral vascular disease ([place]) 08/04/2012   Essential hypertension, benign 08/04/2012   Hyperlipidemia 08/04/2012   Dizziness 08/04/2012    Past Surgical History:  Procedure Laterality Date   ABDOMINAL AORTOGRAM W/LOWER EXTREMITY N/A 07/27/2018   Procedure: ABDOMINAL AORTOGRAM W/LOWER EXTREMITY;  Surgeon: Lorretta Harp, MD;  Location: [place] [facility];  Service: Cardiovascular;  Laterality: N/A;   AMPUTATION Right 08/28/2018   Procedure: RIGHT BELOW KNEE AMPUTATION;  Surgeon: [name], MD;  Location: [place];  Service: Orthopedics;  Laterality: Right;   AMPUTATION Left 12/02/2018   Procedure: LEFT TRANSMETATARSAL AMPUTATION AND NEGATIVE PRESSURE WOUND VAC PLACEMENT;  Surgeon: [name], MD;  Location: [place];  Service: Orthopedics;  Laterality: Left;   AMPUTATION Left 12/18/2018   Procedure: LEFT BELOW KNEE AMPUTATION;  Surgeon: [name], MD;  Location: [place];  Service: Orthopedics;  Laterality: Left;   APPENDECTOMY  1943   BELOW KNEE LEG AMPUTATION Left 12/18/2018   CATARACT EXTRACTION  2012   x2   COLONOSCOPY N/A 11/01/2014   Procedure: COLONOSCOPY;   Surgeon: Aviva Signs, MD;  [facility];  Service: Gastroenterology;  Laterality: N/A;   ESOPHAGOGASTRODUODENOSCOPY N/A 11/01/2014   Procedure: ESOPHAGOGASTRODUODENOSCOPY (EGD);  Surgeon: Aviva Signs, MD;  [facility];  Service: Gastroenterology;  Laterality: N/A;   HERNIA REPAIR Left    INGUINAL HERNIA REPAIR Right 03/01/2013   Procedure: RIGHT INGUINAL HERNIORRHAPHY;  Surgeon: Jamesetta So, MD;  Location: [facility];  Service: General;  Laterality: Right;   INSERTION OF MESH Right 03/01/2013   Procedure: INSERTION OF MESH;  Surgeon: Jamesetta So, MD;  Location: [facility];  Service: General;  Laterality: Right;   LOWER EXTREMITY ANGIOGRAPHY Right 08/25/2018   Procedure: LOWER EXTREMITY ANGIOGRAPHY;  Surgeon: Wellington Hampshire, MD;  Location: [place] [facility];  Service: Cardiovascular;  Laterality: Right;   LOWER EXTREMITY ANGIOGRAPHY N/A 11/25/2018   Procedure: LOWER EXTREMITY ANGIOGRAPHY;  Surgeon: Wellington Hampshire, MD;  Location: [place] [facility];  Service: Cardiovascular;  Laterality: N/A;  NM MYOCAR PERF WALL MOTION  06/05/2010   no significant ischemia   PERIPHERAL VASCULAR ATHERECTOMY Right 08/03/2018   Procedure: PERIPHERAL VASCULAR ATHERECTOMY;  Surgeon: Lorretta Harp, MD;  Location: [place] [facility];  Service: Cardiovascular;  Laterality: Right;   PERIPHERAL VASCULAR ATHERECTOMY Left 11/23/2018   Procedure: PERIPHERAL VASCULAR ATHERECTOMY;  Surgeon: Lorretta Harp, MD;  Location: [place] [facility];  Service: Cardiovascular;  Laterality: Left;  sfa with dc balloon   PERIPHERAL VASCULAR BALLOON ANGIOPLASTY Left 11/25/2018   Procedure: PERIPHERAL VASCULAR BALLOON ANGIOPLASTY;  Surgeon: Wellington Hampshire, MD;  Location: [place] [facility];  Service: Cardiovascular;  Laterality: Left;  popliteal   PERIPHERAL VASCULAR INTERVENTION Left 11/25/2018   Procedure: PERIPHERAL VASCULAR INTERVENTION;  Surgeon: Wellington Hampshire, MD;  Location: [place] [facility];   Service: Cardiovascular;  Laterality: Left;  tibial peroneal trunk and peroneal stents    stent  06/14/2010   left leg   STUMP REVISION Left 01/20/2019   Procedure: REVISION LEFT BELOW KNEE AMPUTATION;  Surgeon: [name], MD;  Location: [place];  Service: Orthopedics;  Laterality: Left;   US ECHOCARDIOGRAPHY  01/21/2006   moderate mitral annular ca+, mild MR, AOV moderately sclerotic.       Family History  Problem Relation Age of Onset   Cancer Mother    Heart attack Father    Stroke Father    Heart attack Brother    Heart attack Brother     Social History   Tobacco Use   Smoking status: Former   Smokeless tobacco: Former    Types: Nurse, children's Use: Never used  Substance Use Topics   Alcohol use: No   Drug use: No    Home Medications Prior to Admission medications   Medication Sig Start Date End Date Taking? Authorizing Provider  acetaminophen (TYLENOL) 500 MG tablet Take 500 mg by mouth every 6 (six) hours as needed for headache (pain).   Yes [provider]  amLODipine (NORVASC) 10 MG tablet Take 1 tablet (10 mg total) by mouth daily. 11/16/19 08/09/20 Yes Hilty, Nadean Corwin, MD  amLODipine (NORVASC) 5 MG tablet Take 5 mg by mouth daily. 07/24/20  Yes [provider]  apixaban (ELIQUIS) 2.5 MG TABS tablet Take 1 tablet (2.5 mg total) by mouth 2 (two) times daily. 06/19/20  Yes Barrett, Evelene Croon, PA-C  Ascorbic Acid (VITAMIN C) 1000 MG tablet Take 1,000 mg by mouth daily.    Yes [provider]  atorvastatin (LIPITOR) 40 MG tablet TAKE 1 TABLET(40 MG) BY MOUTH EVERY EVENING 07/19/20  Yes Hilty, Nadean Corwin, MD  betamethasone dipropionate 0.05 % cream Apply 1 application topically 2 (two) times daily. 12/03/19  Yes [provider]  carvedilol (COREG) 3.125 MG tablet Take 1 tablet (3.125 mg total) by mouth 2 (two) times daily with a meal. 06/19/20  Yes Barrett, Evelene Croon, PA-C  cephALEXin (KEFLEX) 250 MG capsule Take 250 mg by mouth  daily. 03/06/20  Yes [provider]  Cholecalciferol (VITAMIN D-3) 125 MCG (5000 UT) TABS Take 5,000 Units by mouth every evening.   Yes [provider]  clopidogrel (PLAVIX) 75 MG tablet Take 1 tablet (75 mg total) by mouth daily with breakfast. 12/03/19  Yes Hilty, Nadean Corwin, MD  co-enzyme Q-10 30 MG capsule Take 100 mg by mouth daily.   Yes [provider]  doxazosin (CARDURA) 2 MG tablet Take 1 tablet (2 mg total) by mouth daily. 06/19/20  Yes Barrett, Evelene Croon, PA-C  famotidine (PEPCID) 20 MG tablet Take 1 tablet (20 mg total) by mouth daily. Patient taking differently: Take 20 mg by mouth 2 (two) times daily. 11/12/18  Yes Johnson, Clanford L, MD  ferrous sulfate 325 (65 FE) MG tablet Take 325 mg by mouth 2 (two) times daily with a meal.   Yes [provider]  furosemide (LASIX) 40 MG tablet Take 2 tablets (80 mg total) by mouth daily. Take 2 tablets (80mg ) once daily in the morning. Can take an additional 1 tablet (40mg ) in the afternoon as needed for weight gain (3lbs in 1 day or 5lbs in 1 week). 06/19/20 09/17/20 Yes Barrett, Evelene Croon, PA-C  glipiZIDE (GLUCOTROL) 10 MG tablet Take 10 mg by mouth 2 (two) times daily.   Yes [provider]  latanoprost (XALATAN) 0.005 % ophthalmic solution Place 1 drop into both eyes at bedtime. 09/30/19  Yes [provider]  levothyroxine (SYNTHROID) 75 MCG tablet Take 75 mcg by mouth daily before breakfast.  02/12/19  Yes [provider]  polyethylene glycol (MIRALAX) 17 g packet Take 17 g by mouth daily as needed for moderate constipation. 12/09/18  Yes Mercy Riding, MD  potassium chloride SA (KLOR-CON) 20 MEQ tablet Take 2 tablets (40 mEq total) by mouth daily. 06/20/20  Yes Barrett, Evelene Croon, PA-C  pyridoxine (B-6) 100 MG tablet Take 100 mg by mouth daily.   Yes [provider]  QUERCETIN PO Take 1,000 mg by mouth daily.   Yes [provider]  traMADol (ULTRAM) 50 MG tablet Take 1  tablet (50 mg total) by mouth every 6 (six) hours as needed. Patient taking differently: Take 50 mg by mouth every 6 (six) hours as needed for moderate pain. 02/15/19  Yes Lovorn, Megan, MD  vitamin E 400 UNIT capsule Take 400 Units by mouth daily.   Yes [provider]  zinc gluconate 50 MG tablet Take 50 mg by mouth daily.   Yes [provider]  ciprofloxacin (CIPRO) 250 MG tablet Take 250 mg by mouth daily. Patient not taking: Reported on 08/09/2020    [provider]  nitroGLYCERIN (NITROSTAT) 0.4 MG SL tablet Place 1 tablet (0.4 mg total) under the tongue every 5 (five) minutes x 3 doses as needed for chest pain. 06/19/20   Barrett, Evelene Croon, PA-C  ONETOUCH ULTRA test strip 1 each 2 (two) times daily. 04/15/19   [provider]    Allergies    Iodinated diagnostic agents, Dye fdc red [red dye], and Hydralazine  Review of Systems   Review of Systems  Unable to perform ROS: Mental status change  Neurological:  Positive for weakness (Generalized).  All other systems reviewed and are negative.  Physical Exam Updated Vital Signs BP 127/68   Pulse 86   Temp 99.3 F (37.4 C) (Oral)   Resp 20   Ht 5\' 11"  (1.803 m)   Wt 98 kg   SpO2 95%   BMI 30.13 kg/m   Physical Exam Vitals and nursing note reviewed.  Constitutional:      General: He is not in acute distress.    Appearance: He is well-developed. He is ill-appearing (chronically ill appearing). He is not diaphoretic.     Comments: Sleepy, arousal to voice  HENT:     Head: Atraumatic.     Nose: Nose normal.     Mouth/Throat:     Mouth: Mucous membranes are dry.  Eyes:     Pupils:  Pupils are equal, round, and reactive to light.  Cardiovascular:     Rate and Rhythm: Normal rate and regular rhythm.     Pulses: Normal pulses.     Heart sounds: Murmur heard.  Pulmonary:     Effort: Pulmonary effort is normal. No respiratory distress.     Breath sounds: Normal breath sounds.  Abdominal:      General: Bowel sounds are normal. There is no distension.     Palpations: Abdomen is soft.     Tenderness: There is no abdominal tenderness. There is no guarding or rebound.  Musculoskeletal:        General: Normal range of motion.     Cervical back: Normal range of motion and neck supple.     Comments: Bilateral BKA's  Skin:    General: Skin is warm and dry.  Neurological:     Comments: No facial droop Moves all 4 extremities Follows commands Smile symmetric, tongue midline Sleepy, arousable to voice her quickly falls back to sleep    ED Results / Procedures / Treatments   Labs (all labs ordered are listed, but only abnormal results are displayed) Labs Reviewed  RESP PANEL BY RT-PCR (FLU A&B, COVID) ARPGX2 - Abnormal; Notable for the following components:      Result Value   SARS Coronavirus 2 by RT PCR POSITIVE (*)    All other components within normal limits  CBC WITH DIFFERENTIAL/PLATELET - Abnormal; Notable for the following components:   RBC 3.85 (*)    Hemoglobin 12.9 (*)    HCT 38.0 (*)    Monocytes Absolute 1.2 (*)    All other components within normal limits  COMPREHENSIVE METABOLIC PANEL - Abnormal; Notable for the following components:   Sodium 131 (*)    Potassium 3.2 (*)    Chloride 97 (*)    Glucose, Bld 114 (*)    BUN 45 (*)    Creatinine, Ser 3.50 (*)    Calcium 8.2 (*)    Albumin 3.3 (*)    AST 45 (*)    Alkaline Phosphatase 35 (*)    GFR, Estimated 16 (*)    All other components within normal limits  URINALYSIS, ROUTINE W REFLEX MICROSCOPIC - Abnormal; Notable for the following components:   APPearance CLOUDY (*)    Protein, ur 100 (*)    Leukocytes,Ua LARGE (*)    WBC, UA >50 (*)    Bacteria, UA RARE (*)    All other components within normal limits  URINE CULTURE    EKG None  Radiology CT Head Wo Contrast  Result Date: 08/09/2020 CLINICAL DATA:  Delirium. EXAM: CT HEAD WITHOUT CONTRAST TECHNIQUE: Contiguous axial images were obtained  from the base of the skull through the vertex without intravenous contrast. COMPARISON:  February 24, 2020. FINDINGS: Brain: No evidence of acute large vascular territory infarction, hemorrhage, hydrocephalus, or mass lesion/mass effect. Patchy white matter hypoattenuation, most likely related to chronic microvascular ischemic disease. Atrophy with ex vacuo ventricular dilation. Vascular: Calcific intracranial atherosclerosis. No hyperdense vessel identified. Skull: No acute fracture. Sinuses/Orbits: Retention cyst versus polyp in the inferior left maxillary sinus. Otherwise, sinuses are largely clear. No acute orbital abnormality. Other: No mastoid effusions. IMPRESSION: 1. Similar versus slightly progressed diffuse pachymeningeal thickening versus thin subdural collections, better characterized on prior MRI. No significant mass effect. 2. Otherwise, no evidence of acute intracranial abnormality. 3. Chronic microvascular ischemic disease and atrophy. Electronically Signed   By: Margaretha Sheffield MD   On: 08/09/2020  15:36    Procedures Procedures   Medications Ordered in ED Medications  ciprofloxacin (CIPRO) IVPB 400 mg (has no administration in time range)  sodium chloride 0.9 % bolus 500 mL (0 mLs Intravenous Stopped 08/09/20 1555)    ED Course  I have reviewed the triage vital signs and the nursing notes.  Pertinent labs & imaging results that were available during my care of the patient were reviewed by me and considered in my medical decision making (see chart for details).  For evaluation of altered mental status.  On chronic suppression due to frequent UTIs.  He is afebrile, nonseptic appearing.  Son states patient typically acts this way when he has urinary tract infection.  Has occasional cough.  No known COVID exposures.  His heart and lungs are clear.  Abdomen soft, nontender.  Plan on labs, imaging and reassess  Labs and imaging personally reviewed and interpreted: UA positive for  infection CT head without any significant abnormality CBC without leukocytosis Metabolic panel with hyponatremia at 131, potassium 3.2, creatinine 3.5, baseline mid twos COVID-positive  Patient reassessed.  Discussed labs and imaging with patient and son in room.  Question metabolic encephalopathy from UTI versus COVID?  Patient does not appear septic at this time.  Without tachycardia, tachypnea, hypoxia or fever.  We will plan to admit for further management.  CONSULT with Dr. Denton Brick with [facility] who agrees to evaluate patient for admission  The patient appears reasonably stabilized for admission considering the current resources, flow, and capabilities available in the ED at this time, and I doubt any other [facility] requiring further screening and/or treatment in the ED prior to admission.     MDM Rules/Calculators/A&P                           DOYE MONTILLA was evaluated in Emergency Department on 08/09/2020 for the symptoms described in the history of present illness. He was evaluated in the context of the global COVID-19 pandemic, which necessitated consideration that the patient might be at risk for infection with the SARS-CoV-2 virus that causes COVID-19. Institutional protocols and algorithms that pertain to the evaluation of patients at risk for COVID-19 are in a state of rapid change based on information released by regulatory bodies including the CDC and federal and state organizations. These policies and algorithms were followed during the patient's care in the ED.  Final Clinical Impression(s) / ED Diagnoses Final diagnoses:  Altered mental status, unspecified altered mental status type  Acute cystitis without hematuria  COVID    Rx / DC Orders ED Discharge Orders     None        Ferron Ishmael A, PA-C 08/09/20 1719    Daleen Bo, MD 08/11/20 1407

## 2020-08-09 NOTE — ED Triage Notes (Signed)
Pt's son states pt started to act a little altered yesterday with not moving around as much, loss of appetite and more sleeping than usual; pt has an indwelling urinary catheter x 5 weeks and is scheduled to have it replaced in 2 days

## 2020-08-09 NOTE — ED Notes (Signed)
Date and time results received: 07/27/221657  Test: Covid Critical Value: positive  Name of Provider Notified: Eulis Foster  Orders Received? Or Actions Taken?: N/A

## 2020-08-10 ENCOUNTER — Encounter: Payer: Medicare Other | Admitting: Physical Therapy

## 2020-08-10 DIAGNOSIS — G9341 Metabolic encephalopathy: Secondary | ICD-10-CM

## 2020-08-10 LAB — HEMOGLOBIN A1C
Hgb A1c MFr Bld: 7.1 % — ABNORMAL HIGH (ref 4.8–5.6)
Mean Plasma Glucose: 157 mg/dL

## 2020-08-10 LAB — CBG MONITORING, ED
Glucose-Capillary: 52 mg/dL — ABNORMAL LOW (ref 70–99)
Glucose-Capillary: 93 mg/dL (ref 70–99)
Glucose-Capillary: 144 mg/dL — ABNORMAL HIGH (ref 70–99)

## 2020-08-10 LAB — CBC
HCT: 37.2 % — ABNORMAL LOW (ref 39.0–52.0)
Hemoglobin: 12.5 g/dL — ABNORMAL LOW (ref 13.0–17.0)
MCH: 33.2 pg (ref 26.0–34.0)
MCHC: 33.6 g/dL (ref 30.0–36.0)
MCV: 98.7 fL (ref 80.0–100.0)
Platelets: 145 10*3/uL — ABNORMAL LOW (ref 150–400)
RBC: 3.77 MIL/uL — ABNORMAL LOW (ref 4.22–5.81)
RDW: 14.4 % (ref 11.5–15.5)
WBC: 6.2 10*3/uL (ref 4.0–10.5)
nRBC: 0 % (ref 0.0–0.2)

## 2020-08-10 LAB — BASIC METABOLIC PANEL
Anion gap: 9 (ref 5–15)
BUN: 45 mg/dL — ABNORMAL HIGH (ref 8–23)
CO2: 24 mmol/L (ref 22–32)
Calcium: 8.1 mg/dL — ABNORMAL LOW (ref 8.9–10.3)
Chloride: 102 mmol/L (ref 98–111)
Creatinine, Ser: 3.56 mg/dL — ABNORMAL HIGH (ref 0.61–1.24)
GFR, Estimated: 16 mL/min — ABNORMAL LOW (ref >60)
Glucose, Bld: 54 mg/dL — ABNORMAL LOW (ref 70–99)
Potassium: 3.1 mmol/L — ABNORMAL LOW (ref 3.5–5.1)
Sodium: 135 mmol/L (ref 135–145)

## 2020-08-10 LAB — GLUCOSE, CAPILLARY: Glucose-Capillary: 207 mg/dL — ABNORMAL HIGH (ref 70–99)

## 2020-08-10 LAB — PROCALCITONIN: Procalcitonin: 0.1 ng/mL

## 2020-08-10 MED ORDER — ZINC OXIDE 40 % EX OINT
TOPICAL_OINTMENT | Freq: Every day | CUTANEOUS | Status: DC | PRN
  Filled 2020-08-10: qty 57

## 2020-08-10 MED ORDER — MELATONIN 3 MG PO TABS
6.0000 mg | ORAL_TABLET | Freq: Once | ORAL | Status: AC
  Administered 2020-08-10: 6 mg via ORAL
  Filled 2020-08-10: qty 2

## 2020-08-10 NOTE — ED Notes (Signed)
Patient yelling and making "whooo" noises at this time. Patient redirected in the room with CNA at bedside to reposition patient and provide comfort measures.

## 2020-08-10 NOTE — Progress Notes (Addendum)
PROGRESS NOTE    [name]  QBH:[id number] DOB: 10-Feb-1927 DOA: 08/09/2020 PCP: Redmond School, MD   Brief Narrative:   [name] is a 87 y.o. male with medical history significant for chronic indwelling Foley catheter, atrial fibrillation, diastolic CHF, diabetes mellitus bilateral BKA, CKD 4. Patient was brought to the ED reports of change in mental status that started yesterday.  Patient's son reports patient has been sleeping more.  Patient was admitted with acute metabolic encephalopathy likely in the setting of COVID-19 infection as well as UTI in the setting of chronic Foley catheter.  Assessment & Plan:   Principal Problem:   Acute metabolic encephalopathy Active Problems:   Moderate aortic stenosis   Chronic diastolic CHF (congestive heart failure) (HCC)   Atrial fibrillation, chronic   Type 2 diabetes mellitus with foot ulcer, without long-term current use of insulin (HCC)   Acute kidney injury superimposed on CKD (HCC)   CKD (chronic kidney disease) stage 4, GFR 15-29 ml/min (HCC)   COVID-19 virus infection   Acute metabolic encephalopathy-improving -Likely in the setting of COVID-19 infection as well as probable UTI -Continue to treat UTI as noted below  UTI in the setting of chronic Foley catheter -Significant pyuria noted -Patient does take Keflex 250 mg daily prophylactically for UTI and last culture demonstrated growth of Pseudomonas sensitive to ceftazidime -Plan to exchange Foley catheter today -Continue IV cefepime -Urine cultures pending  COVID-19 infection -Family not interested in any additional treatment for this aside from steroids -Patient has been vaccinated twice, but no booster -Continue to trend labs  Hypokalemia -Replete and reevaluate in a.m.  AKI on CKD stage IV -Baseline creatinine 2.5-3.1 -Continue to hydrate for now and hold Lasix  Paroxysmal atrial fibrillation -Continue Eliquis and carvedilol with adequate heart rate  control noted  Type 2 diabetes-noted to have some hypoglycemia -Hemoglobin A1c 7.1% -Holding glipizide for now -Continue on dextrose fluids  Diastolic CHF -Appears dehydrated -Holding Lasix for now -Monitor repeat labs  Bilateral below-knee amputation  Stage II sacral pressure ulcer -Does not appear infected, appreciate wound care consultation  Moderate aortic stenosis -Recent echocardiogram 6/22 shows severe calcification of aortic valve with moderate to severe stenosis -Follow-up with cardiology outpatient   DVT prophylaxis: Eliquis Code Status: DNR Family Communication: Discussed with son on phone 7/28 Disposition Plan:  Status is: Inpatient  Remains inpatient appropriate because:Altered mental status, IV treatments appropriate due to intensity of illness or inability to take PO, and Inpatient level of care appropriate due to severity of illness  Dispo: The patient is from: Home              Anticipated d/c is to: Home              Patient currently is not medically stable to d/c.   Difficult to place patient No    Skin Assessment:  I have examined the patient's skin and I agree with the wound assessment as performed by the wound care RN as outlined below:  Pressure Injury 02/03/19 Buttocks Right Stage 1 -  Intact skin with non-blanchable redness of a localized area usually over a bony prominence. (Active)  02/03/19 0020  Location: Buttocks  Location Orientation: Right  Staging: Stage 1 -  Intact skin with non-blanchable redness of a localized area usually over a bony prominence.  Wound Description (Comments):   Present on Admission: Yes     Pressure Injury 02/03/19 Buttocks Left Stage 2 -  Partial thickness loss of dermis  presenting as a shallow open injury with a red, pink wound bed without slough. (Active)  02/03/19 1707  Location: Buttocks  Location Orientation: Left  Staging: Stage 2 -  Partial thickness loss of dermis presenting as a shallow open injury  with a red, pink wound bed without slough.  Wound Description (Comments):   Present on Admission: Yes     Pressure Injury 02/25/20 Buttocks Mid Stage 3 -  Full thickness tissue loss. Subcutaneous fat may be visible but bone, tendon or muscle are NOT exposed. (Active)  02/25/20   Location: Buttocks  Location Orientation: Mid  Staging: Stage 3 -  Full thickness tissue loss. Subcutaneous fat may be visible but bone, tendon or muscle are NOT exposed.  Wound Description (Comments):   Present on Admission: Yes    Consultants:  None  Procedures:  See below  Antimicrobials:  Anti-infectives (From admission, onward)    Start     Dose/Rate Route Frequency Ordered Stop   08/09/20 2000  ceFEPIme (MAXIPIME) 2 g in sodium chloride 0.9 % 100 mL IVPB        2 g 200 mL/hr over 30 Minutes Intravenous Every 24 hours 08/09/20 1954     08/09/20 1700  ciprofloxacin (CIPRO) IVPB 400 mg        400 mg 200 mL/hr over 60 Minutes Intravenous  Once 08/09/20 1658 08/09/20 2006       Subjective: Patient seen and evaluated today with no new acute complaints or concerns. No acute concerns or events noted overnight.  He seems more oriented and knows the year and where he is along with his name and birthday.  Objective: Vitals:   08/10/20 0800 08/10/20 0830 08/10/20 0900 08/10/20 0930  BP: 132/82 137/69 (!) 142/87 131/66  Pulse:   91 95  Resp: 20 (!) 21 (!) 23 (!) 23  Temp:      TempSrc:      SpO2: 96%  96% 94%  Weight:      Height:        Intake/Output Summary (Last 24 hours) at 08/10/2020 1210 Last data filed at 08/10/2020 0650 Gross per 24 hour  Intake 1304.7 ml  Output 350 ml  Net 954.7 ml   Filed Weights   08/09/20 1409  Weight: 98 kg    Examination:  General exam: Appears calm and comfortable  Respiratory system: Clear to auscultation. Respiratory effort normal. Cardiovascular system: S1 & S2 heard, RRR.  Gastrointestinal system: Abdomen is soft Central nervous system: Alert and  awake Extremities: Bilateral BKA Skin: No significant lesions noted Psychiatry: Flat affect. Foley catheter with clear, yellow urine output noted.    Data Reviewed: I have personally reviewed following labs and imaging studies  CBC: Recent Labs  Lab 08/09/20 1447 08/10/20 0459  WBC 8.0 6.2  NEUTROABS 5.0  --   HGB 12.9* 12.5*  HCT 38.0* 37.2*  MCV 98.7 98.7  PLT 162 099*   Basic Metabolic Panel: Recent Labs  Lab 08/09/20 1447 08/10/20 0459  NA 131* 135  K 3.2* 3.1*  CL 97* 102  CO2 24 24  GLUCOSE 114* 54*  BUN 45* 45*  CREATININE 3.50* 3.56*  CALCIUM 8.2* 8.1*   GFR: Estimated Creatinine Clearance: 16.5 mL/min (A) (by C-G formula based on SCr of 3.56 mg/dL (H)). Liver Function Tests: Recent Labs  Lab 08/09/20 1447  AST 45*  ALT 22  ALKPHOS 35*  BILITOT 0.6  PROT 7.3  ALBUMIN 3.3*   No results for input(s): LIPASE, AMYLASE in  the last 168 hours. No results for input(s): AMMONIA in the last 168 hours. Coagulation Profile: No results for input(s): INR, PROTIME in the last 168 hours. Cardiac Enzymes: No results for input(s): CKTOTAL, CKMB, CKMBINDEX, TROPONINI in the last 168 hours. BNP (last 3 results) No results for input(s): PROBNP in the last 8760 hours. HbA1C: Recent Labs    08/09/20 1430  HGBA1C 7.1*   CBG: Recent Labs  Lab 08/09/20 2014 08/09/20 2037 08/09/20 2150 08/10/20 0645 08/10/20 0719  GLUCAP 32* 142* 70 52* 93   Lipid Profile: No results for input(s): CHOL, HDL, LDLCALC, TRIG, CHOLHDL, LDLDIRECT in the last 72 hours. Thyroid Function Tests: No results for input(s): TSH, T4TOTAL, FREET4, T3FREE, THYROIDAB in the last 72 hours. Anemia Panel: Recent Labs    08/09/20 1430  FERRITIN 404*   Sepsis Labs: Recent Labs  Lab 08/09/20 1430 08/10/20 0459  PROCALCITON <0.10 0.10    Recent Results (from the past 240 hour(s))  Resp Panel by RT-PCR (Flu A&B, Covid) Nasopharyngeal Swab     Status: Abnormal   Collection Time:  08/09/20  3:52 PM   Specimen: Nasopharyngeal Swab; Nasopharyngeal(NP) swabs in vial transport medium  Result Value Ref Range Status   SARS Coronavirus 2 by RT PCR POSITIVE (A) NEGATIVE Final    Comment: CRITICAL RESULT CALLED TO, READ BACK BY AND VERIFIED WITH: DELROSA,K 1651 08/09/2020 COLEMAN,R (NOTE) SARS-CoV-2 target nucleic acids are DETECTED.  The SARS-CoV-2 RNA is generally detectable in upper respiratory specimens during the acute phase of infection. Positive results are indicative of the presence of the identified virus, but do not rule out bacterial infection or co-infection with other pathogens not detected by the test. Clinical correlation with patient history and other diagnostic information is necessary to determine patient infection status. The expected result is Negative.  Fact Sheet for Patients: EntrepreneurPulse.com.au  Fact Sheet for Healthcare Providers: IncredibleEmployment.be  This test is not yet approved or cleared by the [place] FDA and  has been authorized for detection and/or diagnosis of SARS-CoV-2 by FDA under an Emergency Use Authorization (EUA).  This EUA will remain in effect (meaning this  test can be used) for the duration of  the COVID-19 declaration under Section 564(b)(1) of the Act, 21 U.S.C. section 360bbb-3(b)(1), unless the authorization is terminated or revoked sooner.     Influenza A by PCR NEGATIVE NEGATIVE Final   Influenza B by PCR NEGATIVE NEGATIVE Final    Comment: (NOTE) The Xpert Xpress SARS-CoV-2/FLU/RSV plus assay is intended as an aid in the diagnosis of influenza from Nasopharyngeal swab specimens and should not be used as a sole basis for treatment. Nasal washings and aspirates are unacceptable for Xpert Xpress SARS-CoV-2/FLU/RSV testing.  Fact Sheet for Patients: EntrepreneurPulse.com.au  Fact Sheet for Healthcare  Providers: IncredibleEmployment.be  This test is not yet approved or cleared by the [place] FDA and has been authorized for detection and/or diagnosis of SARS-CoV-2 by FDA under an Emergency Use Authorization (EUA). This EUA will remain in effect (meaning this test can be used) for the duration of the COVID-19 declaration under Section 564(b)(1) of the Act, 21 U.S.C. section 360bbb-3(b)(1), unless the authorization is terminated or revoked.  Performed at [facility], [address], [place], [place] [address]          Radiology Studies: CT Head Wo Contrast  Result Date: 08/09/2020 CLINICAL DATA:  Delirium. EXAM: CT HEAD WITHOUT CONTRAST TECHNIQUE: Contiguous axial images were obtained from the base of the skull through the vertex  without intravenous contrast. COMPARISON:  February 24, 2020. FINDINGS: Brain: No evidence of acute large vascular territory infarction, hemorrhage, hydrocephalus, or mass lesion/mass effect. Patchy white matter hypoattenuation, most likely related to chronic microvascular ischemic disease. Atrophy with ex vacuo ventricular dilation. Vascular: Calcific intracranial atherosclerosis. No hyperdense vessel identified. Skull: No acute fracture. Sinuses/Orbits: Retention cyst versus polyp in the inferior left maxillary sinus. Otherwise, sinuses are largely clear. No acute orbital abnormality. Other: No mastoid effusions. IMPRESSION: 1. Similar versus slightly progressed diffuse pachymeningeal thickening versus thin subdural collections, better characterized on prior MRI. No significant mass effect. 2. Otherwise, no evidence of acute intracranial abnormality. 3. Chronic microvascular ischemic disease and atrophy. Electronically Signed   By: Margaretha Sheffield MD   On: 08/09/2020 15:36   DG Chest Portable 1 View  Result Date: 08/09/2020 CLINICAL DATA:  87 year old male with positive COVID-19. EXAM: PORTABLE CHEST 1 VIEW COMPARISON:  Chest radiograph  dated 06/17/2020. FINDINGS: Small left pleural effusion and left lung base atelectasis or infiltrate similar or slightly increased since the prior radiograph. There is diffuse interstitial and vascular prominence similar to prior radiograph which may represent edema. No pneumothorax. Stable cardiomediastinal silhouette. Osteopenia with degenerative changes spine and shoulders. No acute osseous pathology. IMPRESSION: 1. Small left pleural effusion and left lung base atelectasis or infiltrate. 2. Diffuse interstitial and vascular prominence similar to prior radiograph. Electronically Signed   By: Anner Crete M.D.   On: 08/09/2020 17:58        Scheduled Meds:  amLODipine  5 mg Oral Daily   apixaban  2.5 mg Oral BID   carvedilol  3.125 mg Oral BID WC   clopidogrel  75 mg Oral Q breakfast   doxazosin  2 mg Oral Daily   levothyroxine  75 mcg Oral Q0600   polyethylene glycol  17 g Oral BID   potassium chloride  40 mEq Oral Once   Continuous Infusions:  ceFEPime (MAXIPIME) IV Stopped (08/09/20 2201)   dextrose 5 % and 0.9 % NaCl with KCl 20 mEq/L 75 mL/hr at 08/10/20 0650     LOS: 1 day    Time spent: 35 minutes    Don Tiu D Manuella Ghazi, DO [facility]  If 7PM-7AM, please contact night-coverage [email or web address] 08/10/2020, 12:10 PM

## 2020-08-10 NOTE — ED Notes (Signed)
Patient is resting comfortably. Patient respirations even and unlabored. NAD noted

## 2020-08-10 NOTE — ED Notes (Signed)
Report given, RN to call when correct bed is in room

## 2020-08-10 NOTE — ED Notes (Signed)
Patient has stage 2 pressure to bilateral buttocks within healing stages. No eschar or slough noted. Patient has sacral pad and propped on right side at this time. Son at the bedside.

## 2020-08-10 NOTE — ED Notes (Signed)
Checked cathter bag

## 2020-08-10 NOTE — ED Notes (Signed)
Pt given orange and grape juice.

## 2020-08-10 NOTE — Consult Note (Addendum)
[facility] Nurse Consult Note: Patient receiving care in [id number] Consult completed remotely after review of chart and [place] with Bedside RN Roselyn Reef Reason for Consult: Sacral wound Wound type: MASD/IAD/Stage 2 on sacrum Pressure Injury POA: Yes Wound bed: Per Roselyn Reef no eschar or slough noted, appears to be a healing PI with some moisture association as well.  Dressing procedure/placement/frequency: Cleanse the buttock area with no rinse cleanser, pat dry and apply Desitin to the area as needed. Place patient on low air loss mattress and turn q4h  Monitor the wound area(s) for worsening of condition such as: Signs/symptoms of infection, increase in size, development of or worsening of odor, development of pain, or increased pain at the affected locations.   Notify the medical team if any of these develop.  Thank you for the consult. [place] nurse will not follow at this time.   Please re-consult the [place] team if needed.  Cathlean Marseilles Tamala Julian, MSN, RN, [place], Lysle Pearl, [facility] Wound Treatment Associate Pager [phone]

## 2020-08-11 LAB — GLUCOSE, CAPILLARY
Glucose-Capillary: 30 mg/dL — CL (ref 70–99)
Glucose-Capillary: 183 mg/dL — ABNORMAL HIGH (ref 70–99)

## 2020-08-11 LAB — C-REACTIVE PROTEIN: CRP: 1.5 mg/dL — ABNORMAL HIGH (ref <1.0)

## 2020-08-11 LAB — FERRITIN: Ferritin: 470 ng/mL — ABNORMAL HIGH (ref 24–336)

## 2020-08-11 LAB — CBC
HCT: 35.1 % — ABNORMAL LOW (ref 39.0–52.0)
Hemoglobin: 12.1 g/dL — ABNORMAL LOW (ref 13.0–17.0)
MCH: 34.3 pg — ABNORMAL HIGH (ref 26.0–34.0)
MCHC: 34.5 g/dL (ref 30.0–36.0)
MCV: 99.4 fL (ref 80.0–100.0)
Platelets: 140 10*3/uL — ABNORMAL LOW (ref 150–400)
RBC: 3.53 MIL/uL — ABNORMAL LOW (ref 4.22–5.81)
RDW: 14.2 % (ref 11.5–15.5)
WBC: 5.7 10*3/uL (ref 4.0–10.5)
nRBC: 0 % (ref 0.0–0.2)

## 2020-08-11 LAB — BASIC METABOLIC PANEL
Anion gap: 10 (ref 5–15)
BUN: 46 mg/dL — ABNORMAL HIGH (ref 8–23)
CO2: 23 mmol/L (ref 22–32)
Calcium: 8.2 mg/dL — ABNORMAL LOW (ref 8.9–10.3)
Chloride: 101 mmol/L (ref 98–111)
Creatinine, Ser: 3.68 mg/dL — ABNORMAL HIGH (ref 0.61–1.24)
GFR, Estimated: 15 mL/min — ABNORMAL LOW (ref >60)
Glucose, Bld: 165 mg/dL — ABNORMAL HIGH (ref 70–99)
Potassium: 3.5 mmol/L (ref 3.5–5.1)
Sodium: 134 mmol/L — ABNORMAL LOW (ref 135–145)

## 2020-08-11 LAB — MAGNESIUM: Magnesium: 2.2 mg/dL (ref 1.7–2.4)

## 2020-08-11 LAB — PROCALCITONIN: Procalcitonin: 0.14 ng/mL

## 2020-08-11 MED ORDER — CIPROFLOXACIN HCL 250 MG PO TABS: 250.0000 mg | ORAL_TABLET | Freq: Two times a day (BID) | ORAL | 0 refills | Status: AC

## 2020-08-11 NOTE — Discharge Summary (Signed)
Physician Discharge Summary  [name] KNL:[id number] DOB: [birth year] DOA: 08/09/2020  PCP: Redmond School, MD  Admit date: 08/09/2020  Discharge date: 08/11/2020  Admitted From:Home  Disposition:  Home  Recommendations for Outpatient Follow-up:  Follow up with PCP in 1-2 weeks Follow-up with urology as previously scheduled outpatient Continue ciprofloxacin as prescribed for 6 more days to complete course of treatment for catheter associated UTI Continue home medications as prior  Home Health: None  Equipment/Devices: Has chronic Foley catheter  Discharge Condition:Stable  CODE STATUS: DNR  Diet recommendation: Heart Healthy/carb modified  Brief/Interim Summary:  [name] is a 87 y.o. male with medical history significant for chronic indwelling Foley catheter, atrial fibrillation, diastolic CHF, diabetes mellitus bilateral BKA, CKD 4. Patient was brought to the ED with report of change in mental status that started yesterday.  Patient's son reports patient has been sleeping more.  Patient was admitted with acute metabolic encephalopathy likely in the setting of COVID-19 infection as well as UTI in the setting of chronic Foley catheter.  -Patient was thought to have acute metabolic encephalopathy related to catheter associated UTI and was started on cefepime empirically.  He was noted to have prior Pseudomonas infection which showed pan sensitivity.  He improved considerably over the course of admission and is now stable for discharge.  Family refused any treatments for COVID and he did not require any further treatment for it either.  He is in stable condition for discharge and has had his catheter exchanged on 7/28.  He will discharge with ciprofloxacin and will follow up with urology as previously scheduled.  Discharge Diagnoses:  Principal Problem:   Acute metabolic encephalopathy Active Problems:   Moderate aortic stenosis   Chronic diastolic CHF (congestive heart  failure) (HCC)   Atrial fibrillation, chronic   Type 2 diabetes mellitus with foot ulcer, without long-term current use of insulin (HCC)   Acute kidney injury superimposed on CKD (HCC)   CKD (chronic kidney disease) stage 4, GFR 15-29 ml/min ([facility])   COVID-19 virus infection  Principal discharge diagnosis: Acute metabolic encephalopathy secondary to UTI in the setting of chronic Foley catheter.  Incidental COVID-19 infection.  Discharge Instructions  Discharge Instructions     Diet - low sodium heart healthy   Complete by: As directed    Discharge wound care:   Complete by: As directed    Daily dressing changes.   Increase activity slowly   Complete by: As directed       Allergies as of 08/11/2020       Reactions   Iodinated Diagnostic Agents Other (See Comments)   caused Fever   Dye Fdc Red [red Dye] Hives   Tolerated red Docusate, Niferex   Hydralazine         Medication List     STOP taking these medications    cephALEXin 250 MG capsule Commonly known as: KEFLEX       TAKE these medications    acetaminophen 500 MG tablet Commonly known as: TYLENOL Take 500 mg by mouth every 6 (six) hours as needed for headache (pain).   amLODipine 5 MG tablet Commonly known as: NORVASC Take 5 mg by mouth daily. What changed: Another medication with the same name was removed. Continue taking this medication, and follow the directions you see here.   apixaban 2.5 MG Tabs tablet Commonly known as: ELIQUIS Take 1 tablet (2.5 mg total) by mouth 2 (two) times daily.   atorvastatin 40 MG tablet Commonly known  as: LIPITOR TAKE 1 TABLET(40 MG) BY MOUTH EVERY EVENING   betamethasone dipropionate 0.05 % cream Apply 1 application topically 2 (two) times daily.   carvedilol 3.125 MG tablet Commonly known as: COREG Take 1 tablet (3.125 mg total) by mouth 2 (two) times daily with a meal.   ciprofloxacin 250 MG tablet Commonly known as: CIPRO Take 1 tablet (250 mg total) by  mouth 2 (two) times daily for 6 days. What changed: when to take this   clopidogrel 75 MG tablet Commonly known as: PLAVIX Take 1 tablet (75 mg total) by mouth daily with breakfast.   co-enzyme Q-10 30 MG capsule Take 100 mg by mouth daily.   doxazosin 2 MG tablet Commonly known as: CARDURA Take 1 tablet (2 mg total) by mouth daily.   famotidine 20 MG tablet Commonly known as: PEPCID Take 1 tablet (20 mg total) by mouth daily. What changed: when to take this   ferrous sulfate 325 (65 FE) MG tablet Take 325 mg by mouth 2 (two) times daily with a meal.   furosemide 40 MG tablet Commonly known as: Lasix Take 2 tablets (80 mg total) by mouth daily. Take 2 tablets (80mg ) once daily in the morning. Can take an additional 1 tablet (40mg ) in the afternoon as needed for weight gain (3lbs in 1 day or 5lbs in 1 week).   glipiZIDE 10 MG tablet Commonly known as: GLUCOTROL Take 10 mg by mouth 2 (two) times daily.   latanoprost 0.005 % ophthalmic solution Commonly known as: XALATAN Place 1 drop into both eyes at bedtime.   levothyroxine 75 MCG tablet Commonly known as: SYNTHROID Take 75 mcg by mouth daily before breakfast.   nitroGLYCERIN 0.4 MG SL tablet Commonly known as: NITROSTAT Place 1 tablet (0.4 mg total) under the tongue every 5 (five) minutes x 3 doses as needed for chest pain.   OneTouch Ultra test strip Generic drug: glucose blood 1 each 2 (two) times daily.   polyethylene glycol 17 g packet Commonly known as: MiraLax Take 17 g by mouth daily as needed for moderate constipation.   potassium chloride SA 20 MEQ tablet Commonly known as: KLOR-CON Take 2 tablets (40 mEq total) by mouth daily.   pyridoxine 100 MG tablet Commonly known as: B-6 Take 100 mg by mouth daily.   QUERCETIN PO Take 1,000 mg by mouth daily.   traMADol 50 MG tablet Commonly known as: Ultram Take 1 tablet (50 mg total) by mouth every 6 (six) hours as needed. What changed: reasons to take  this   vitamin C 1000 MG tablet Take 1,000 mg by mouth daily.   Vitamin D-3 125 MCG (5000 UT) Tabs Take 5,000 Units by mouth every evening.   vitamin E 180 MG (400 UNITS) capsule Take 400 Units by mouth daily.   zinc gluconate 50 MG tablet Take 50 mg by mouth daily.               Discharge Care Instructions  (From admission, onward)           Start     Ordered   08/11/20 0000  Discharge wound care:       Comments: Daily dressing changes.   08/11/20 1013            Follow-up Information     Redmond School, MD. Schedule an appointment as soon as possible for a visit in 1 week(s).   Specialty: Internal Medicine Contact information: [address] [place] [address] [phone]  Allergies  Allergen Reactions   Iodinated Diagnostic Agents Other (See Comments)    caused Fever   Dye Fdc Red [Red Dye] Hives    Tolerated red Docusate, Niferex   Hydralazine     Consultations: None   Procedures/Studies: CT Head Wo Contrast  Result Date: 08/09/2020 CLINICAL DATA:  Delirium. EXAM: CT HEAD WITHOUT CONTRAST TECHNIQUE: Contiguous axial images were obtained from the base of the skull through the vertex without intravenous contrast. COMPARISON:  February 24, 2020. FINDINGS: Brain: No evidence of acute large vascular territory infarction, hemorrhage, hydrocephalus, or mass lesion/mass effect. Patchy white matter hypoattenuation, most likely related to chronic microvascular ischemic disease. Atrophy with ex vacuo ventricular dilation. Vascular: Calcific intracranial atherosclerosis. No hyperdense vessel identified. Skull: No acute fracture. Sinuses/Orbits: Retention cyst versus polyp in the inferior left maxillary sinus. Otherwise, sinuses are largely clear. No acute orbital abnormality. Other: No mastoid effusions. IMPRESSION: 1. Similar versus slightly progressed diffuse pachymeningeal thickening versus thin subdural collections, better  characterized on prior MRI. No significant mass effect. 2. Otherwise, no evidence of acute intracranial abnormality. 3. Chronic microvascular ischemic disease and atrophy. Electronically Signed   By: Margaretha Sheffield MD   On: 08/09/2020 15:36   DG Chest Portable 1 View  Result Date: 08/09/2020 CLINICAL DATA:  87 year old male with positive COVID-19. EXAM: PORTABLE CHEST 1 VIEW COMPARISON:  Chest radiograph dated 06/17/2020. FINDINGS: Small left pleural effusion and left lung base atelectasis or infiltrate similar or slightly increased since the prior radiograph. There is diffuse interstitial and vascular prominence similar to prior radiograph which may represent edema. No pneumothorax. Stable cardiomediastinal silhouette. Osteopenia with degenerative changes spine and shoulders. No acute osseous pathology. IMPRESSION: 1. Small left pleural effusion and left lung base atelectasis or infiltrate. 2. Diffuse interstitial and vascular prominence similar to prior radiograph. Electronically Signed   By: Anner Crete M.D.   On: 08/09/2020 17:58     Discharge Exam: Vitals:   08/10/20 2335 08/11/20 0543  BP: 135/81 (!) 155/81  Pulse: 94 93  Resp: 16 18  Temp: 98.4 F (36.9 C) 98.3 F (36.8 C)  SpO2: 96% 96%   Vitals:   08/10/20 1931 08/10/20 2052 08/10/20 2335 08/11/20 0543  BP:  (!) 144/93 135/81 (!) 155/81  Pulse:  92 94 93  Resp:  19 16 18   Temp: 97.9 F (36.6 C) 98.5 F (36.9 C) 98.4 F (36.9 C) 98.3 F (36.8 C)  TempSrc: Oral  Oral Oral  SpO2:  97% 96% 96%  Weight:      Height:        General: Pt is alert, awake, not in acute distress Cardiovascular: RRR, S1/S2 +, no rubs, no gallops Respiratory: CTA bilaterally, no wheezing, no rhonchi Abdominal: Soft, NT, ND, bowel sounds + Extremities: Bilateral BKA Foley catheter with clear, yellow urine output    The results of significant diagnostics from this hospitalization (including imaging, microbiology, ancillary and laboratory)  are listed below for reference.     Microbiology: Recent Results (from the past 240 hour(s))  Urine Culture     Status: Abnormal (Preliminary result)   Collection Time: 08/09/20  2:46 PM   Specimen: Urine, Catheterized  Result Value Ref Range Status   Specimen Description   Final    URINE, CATHETERIZED Performed at [facility], [address], [place], [place] [address]    Special Requests   Final    NONE Performed at [facility], [address] [place], [place] [address]    Culture (A)  Final    >=100,000 COLONIES/mL PSEUDOMONAS AERUGINOSA CULTURE REINCUBATED FOR BETTER GROWTH SUSCEPTIBILITIES TO FOLLOW Performed at [facility], [place] [address], [place], [place] [address]    Report Status PENDING  Incomplete  Resp Panel by RT-PCR (Flu A&B, Covid) Nasopharyngeal Swab     Status: Abnormal   Collection Time: 08/09/20  3:52 PM   Specimen: Nasopharyngeal Swab; Nasopharyngeal(NP) swabs in vial transport medium  Result Value Ref Range Status   SARS Coronavirus 2 by RT PCR POSITIVE (A) NEGATIVE Final    Comment: CRITICAL RESULT CALLED TO, READ BACK BY AND VERIFIED WITH: DELROSA,K 1651 08/09/2020 COLEMAN,R (NOTE) SARS-CoV-2 target nucleic acids are DETECTED.  The SARS-CoV-2 RNA is generally detectable in upper respiratory specimens during the acute phase of infection. Positive results are indicative of the presence of the identified virus, but do not rule out bacterial infection or co-infection with other pathogens not detected by the test. Clinical correlation with patient history and other diagnostic information is necessary to determine patient infection status. The expected result is Negative.  Fact Sheet for Patients: EntrepreneurPulse.com.au  Fact Sheet for Healthcare Providers: IncredibleEmployment.be  This test is not yet approved or cleared by the [place] FDA and  has been authorized for detection and/or diagnosis of  SARS-CoV-2 by FDA under an Emergency Use Authorization (EUA).  This EUA will remain in effect (meaning this  test can be used) for the duration of  the COVID-19 declaration under Section 564(b)(1) of the Act, 21 U.S.C. section 360bbb-3(b)(1), unless the authorization is terminated or revoked sooner.     Influenza A by PCR NEGATIVE NEGATIVE Final   Influenza B by PCR NEGATIVE NEGATIVE Final    Comment: (NOTE) The Xpert Xpress SARS-CoV-2/FLU/RSV plus assay is intended as an aid in the diagnosis of influenza from Nasopharyngeal swab specimens and should not be used as a sole basis for treatment. Nasal washings and aspirates are unacceptable for Xpert Xpress SARS-CoV-2/FLU/RSV testing.  Fact Sheet for Patients: EntrepreneurPulse.com.au  Fact Sheet for Healthcare Providers: IncredibleEmployment.be  This test is not yet approved or cleared by the [place] FDA and has been authorized for detection and/or diagnosis of SARS-CoV-2 by FDA under an Emergency Use Authorization (EUA). This EUA will remain in effect (meaning this test can be used) for the duration of the COVID-19 declaration under Section 564(b)(1) of the Act, 21 U.S.C. section 360bbb-3(b)(1), unless the authorization is terminated or revoked.  Performed at [facility], [address] [place], [place] [address]      Labs: BNP (last 3 results) Recent Labs    06/17/20 0313  BNP 098.1*   Basic Metabolic Panel: Recent Labs  Lab 08/09/20 1447 08/10/20 0459 08/11/20 0817  NA 131* 135 134*  K 3.2* 3.1* 3.5  CL 97* 102 101  CO2 24 24 23   GLUCOSE 114* 54* 165*  BUN 45* 45* 46*  CREATININE 3.50* 3.56* 3.68*  CALCIUM 8.2* 8.1* 8.2*  MG  --   --  2.2   Liver Function Tests: Recent Labs  Lab 08/09/20 1447  AST 45*  ALT 22  ALKPHOS 35*  BILITOT 0.6  PROT 7.3  ALBUMIN 3.3*   No results for input(s): LIPASE, AMYLASE in the last 168 hours. No results for input(s): AMMONIA  in the last 168 hours. CBC: Recent Labs  Lab 08/09/20 1447 08/10/20 0459 08/11/20 0817  WBC 8.0 6.2 5.7  NEUTROABS 5.0  --   --   HGB 12.9* 12.5* 12.1*  HCT 38.0* 37.2* 35.1*  MCV 98.7 98.7 99.4  PLT 162 145* 140*   Cardiac Enzymes: No results for input(s): CKTOTAL, CKMB, CKMBINDEX, TROPONINI in the last 168 hours. BNP: Invalid input(s): POCBNP CBG: Recent Labs  Lab 08/10/20 0645 08/10/20 0719 08/10/20 1250 08/10/20 2329 08/11/20 0543  GLUCAP 52* 93 144* 207* 183*   D-Dimer Recent Labs    08/09/20 1842  DDIMER 1.94*   Hgb A1c Recent Labs    08/09/20 1430  HGBA1C 7.1*   Lipid Profile No results for input(s): CHOL, HDL, LDLCALC, TRIG, CHOLHDL, LDLDIRECT in the last 72 hours. Thyroid function studies No results for input(s): TSH, T4TOTAL, T3FREE, THYROIDAB in the last 72 hours.  Invalid input(s): FREET3 Anemia work up Recent Labs    08/09/20 1430 08/11/20 0817  FERRITIN 404* 470*   Urinalysis    Component Value Date/Time   COLORURINE YELLOW 08/09/2020 1446   APPEARANCEUR CLOUDY (A) 08/09/2020 1446   LABSPEC 1.015 08/09/2020 1446   PHURINE 5.0 08/09/2020 1446   GLUCOSEU NEGATIVE 08/09/2020 1446   HGBUR NEGATIVE 08/09/2020 1446   BILIRUBINUR NEGATIVE 08/09/2020 1446   KETONESUR NEGATIVE 08/09/2020 1446   PROTEINUR 100 (A) 08/09/2020 1446   NITRITE NEGATIVE 08/09/2020 1446   LEUKOCYTESUR LARGE (A) 08/09/2020 1446   Sepsis Labs Invalid input(s): PROCALCITONIN,  WBC,  LACTICIDVEN Microbiology Recent Results (from the past 240 hour(s))  Urine Culture     Status: Abnormal (Preliminary result)   Collection Time: 08/09/20  2:46 PM   Specimen: Urine, Catheterized  Result Value Ref Range Status   Specimen Description   Final    URINE, CATHETERIZED Performed at [facility], [address] [place], [place] [address]    Special Requests   Final    NONE Performed at [facility], [address] [place], [place] [address]    Culture (A)  Final     >=100,000 COLONIES/mL PSEUDOMONAS AERUGINOSA CULTURE REINCUBATED FOR BETTER GROWTH SUSCEPTIBILITIES TO FOLLOW Performed at [facility], [place] [address], [place], [place] [address]    Report Status PENDING  Incomplete  Resp Panel by RT-PCR (Flu A&B, Covid) Nasopharyngeal Swab     Status: Abnormal   Collection Time: 08/09/20  3:52 PM   Specimen: Nasopharyngeal Swab; Nasopharyngeal(NP) swabs in vial transport medium  Result Value Ref Range Status   SARS Coronavirus 2 by RT PCR POSITIVE (A) NEGATIVE Final    Comment: CRITICAL RESULT CALLED TO, READ BACK BY AND VERIFIED WITH: DELROSA,K 1651 08/09/2020 COLEMAN,R (NOTE) SARS-CoV-2 target nucleic acids are DETECTED.  The SARS-CoV-2 RNA is generally detectable in upper respiratory specimens during the acute phase of infection. Positive results are indicative of the presence of the identified virus, but do not rule out bacterial infection or co-infection with other pathogens not detected by the test. Clinical correlation with patient history and other diagnostic information is necessary to determine patient infection status. The expected result is Negative.  Fact Sheet for Patients: EntrepreneurPulse.com.au  Fact Sheet for Healthcare Providers: IncredibleEmployment.be  This test is not yet approved or cleared by the [place] FDA and  has been authorized for detection and/or diagnosis of SARS-CoV-2 by FDA under an Emergency Use Authorization (EUA).  This EUA will remain in effect (meaning this  test can be used) for the duration of  the COVID-19 declaration under Section 564(b)(1) of the Act, 21 U.S.C. section 360bbb-3(b)(1), unless the authorization is terminated or revoked sooner.     Influenza A by PCR NEGATIVE NEGATIVE Final   Influenza B by PCR NEGATIVE NEGATIVE  Final    Comment: (NOTE) The Xpert Xpress SARS-CoV-2/FLU/RSV plus assay is intended as an aid in the diagnosis of influenza  from Nasopharyngeal swab specimens and should not be used as a sole basis for treatment. Nasal washings and aspirates are unacceptable for Xpert Xpress SARS-CoV-2/FLU/RSV testing.  Fact Sheet for Patients: EntrepreneurPulse.com.au  Fact Sheet for Healthcare Providers: IncredibleEmployment.be  This test is not yet approved or cleared by the [place] FDA and has been authorized for detection and/or diagnosis of SARS-CoV-2 by FDA under an Emergency Use Authorization (EUA). This EUA will remain in effect (meaning this test can be used) for the duration of the COVID-19 declaration under Section 564(b)(1) of the Act, 21 U.S.C. section 360bbb-3(b)(1), unless the authorization is terminated or revoked.  Performed at [facility], [address], [place],  [address]      Time coordinating discharge: 35 minutes  SIGNED:   Rodena Goldmann, DO [facility] 08/11/2020, 10:18 AM  If 7PM-7AM, please contact night-coverage [email or web address]

## 2020-08-12 LAB — URINE CULTURE: Culture: >=100000 — AB

## 2020-08-13 DIAGNOSIS — E7849 Other hyperlipidemia: Secondary | ICD-10-CM | POA: Diagnosis not present

## 2020-08-13 DIAGNOSIS — I129 Hypertensive chronic kidney disease with stage 1 through stage 4 chronic kidney disease, or unspecified chronic kidney disease: Secondary | ICD-10-CM | POA: Diagnosis not present

## 2020-08-13 DIAGNOSIS — E1122 Type 2 diabetes mellitus with diabetic chronic kidney disease: Secondary | ICD-10-CM | POA: Diagnosis not present

## 2020-08-14 ENCOUNTER — Other Ambulatory Visit (HOSPITAL_COMMUNITY): Payer: Medicare Other

## 2020-08-15 ENCOUNTER — Encounter: Payer: Medicare Other | Admitting: Physical Therapy

## 2020-08-15 DIAGNOSIS — N390 Urinary tract infection, site not specified: Secondary | ICD-10-CM | POA: Diagnosis not present

## 2020-08-15 DIAGNOSIS — I5033 Acute on chronic diastolic (congestive) heart failure: Secondary | ICD-10-CM | POA: Diagnosis not present

## 2020-08-15 DIAGNOSIS — I480 Paroxysmal atrial fibrillation: Secondary | ICD-10-CM | POA: Diagnosis not present

## 2020-08-15 DIAGNOSIS — U099 Post covid-19 condition, unspecified: Secondary | ICD-10-CM | POA: Diagnosis not present

## 2020-08-15 DIAGNOSIS — N184 Chronic kidney disease, stage 4 (severe): Secondary | ICD-10-CM | POA: Diagnosis not present

## 2020-08-15 DIAGNOSIS — Z681 Body mass index (BMI) 19 or less, adult: Secondary | ICD-10-CM | POA: Diagnosis not present

## 2020-08-16 DIAGNOSIS — R338 Other retention of urine: Secondary | ICD-10-CM | POA: Diagnosis not present

## 2020-08-16 DIAGNOSIS — I5033 Acute on chronic diastolic (congestive) heart failure: Secondary | ICD-10-CM | POA: Diagnosis not present

## 2020-08-16 DIAGNOSIS — Z466 Encounter for fitting and adjustment of urinary device: Secondary | ICD-10-CM | POA: Diagnosis not present

## 2020-08-16 DIAGNOSIS — I214 Non-ST elevation (NSTEMI) myocardial infarction: Secondary | ICD-10-CM | POA: Diagnosis not present

## 2020-08-16 DIAGNOSIS — E1151 Type 2 diabetes mellitus with diabetic peripheral angiopathy without gangrene: Secondary | ICD-10-CM | POA: Diagnosis not present

## 2020-08-16 DIAGNOSIS — L89311 Pressure ulcer of right buttock, stage 1: Secondary | ICD-10-CM | POA: Diagnosis not present

## 2020-08-16 DIAGNOSIS — E1122 Type 2 diabetes mellitus with diabetic chronic kidney disease: Secondary | ICD-10-CM | POA: Diagnosis not present

## 2020-08-16 DIAGNOSIS — N184 Chronic kidney disease, stage 4 (severe): Secondary | ICD-10-CM | POA: Diagnosis not present

## 2020-08-16 DIAGNOSIS — L89322 Pressure ulcer of left buttock, stage 2: Secondary | ICD-10-CM | POA: Diagnosis not present

## 2020-08-16 DIAGNOSIS — I130 Hypertensive heart and chronic kidney disease with heart failure and stage 1 through stage 4 chronic kidney disease, or unspecified chronic kidney disease: Secondary | ICD-10-CM | POA: Diagnosis not present

## 2020-08-16 DIAGNOSIS — I480 Paroxysmal atrial fibrillation: Secondary | ICD-10-CM | POA: Diagnosis not present

## 2020-08-16 DIAGNOSIS — I350 Nonrheumatic aortic (valve) stenosis: Secondary | ICD-10-CM | POA: Diagnosis not present

## 2020-08-17 ENCOUNTER — Encounter: Payer: Medicare Other | Admitting: Physical Therapy

## 2020-08-17 ENCOUNTER — Other Ambulatory Visit: Payer: Self-pay | Admitting: *Deleted

## 2020-08-17 ENCOUNTER — Telehealth: Payer: Self-pay

## 2020-08-17 NOTE — Telephone Encounter (Signed)
(  5:40 pm) SW completed a follow-up call to patient's daughter. SW scheduled a follow-up visit with patient for 08/24/09 @11 :30am.

## 2020-08-17 NOTE — Patient Outreach (Signed)
[facility]) Care Management  08/17/2020  [name] Jul 28, 1927 [id number]   Noted that member was admitted to hospital 7/27-7/29.  Call placed to daughter, state member was diagnosed with Covid and UTI.  He is now on Cipro, no fever or ongoing confusion, denies he has had Covid symptoms.  She does report that member has not been doing well over the last few days, not really eating and sleeping more.  Follow up with PCP has been done, has home health for nursing and PT.  Per daughter, wound is not healing.  Member was to have follow up home visit by [facility] last week, daughter report no one showed.  Call placed to [facility], notified that member's assigned NP is no longer with agency and member will be reassigned.  They will call daughter directly to schedule home visit.  Denies any urgent concerns, encouraged to contact this care manager with questions.  Agrees to follow up within the next month.   Goals Addressed             This Visit's Progress    [facility] - Monitor and Manage My Blood Sugar   On track    Timeframe:  Short-Term Goal Priority:  Medium Start Date:          5/16 Expected End Date:  8/16  Barriers: Other - Cognitive, dementia                   - check blood sugar at prescribed times - check blood sugar if I feel it is too high or too low - take the blood sugar log to all doctor visits    Why is this important?   Checking your blood sugar at home helps to keep it from getting very high or very low.  Writing the results in a diary or log helps the doctor know how to care for you.  Your blood sugar log should have the time, date and the results.  Also, write down the amount of insulin or other medicine that you take.  Other information, like what you ate, exercise done and how you were feeling, will also be helpful.     Notes:   11/18 - blood sugar readings reviewed, discussed compliance with medication regime  12/28 - Discussed proper  diabetic and low sodium diet  02/11/2020 - Remind of importance of managing blood sugars in correlation to wound healing  2/21 - Confirmed member has ongoing participation with home health for weekly monitoring  3/21 - Encouraged to continue daily blood sugar monitoring as well as recording readings  4/18 - Son report blood sugars range 130-170's, was 219 today.  Relates this to increased appetite and eating over the Easter holiday.  5/16 - Blood sugars increased to around 300's when on antibiotics for UTI, now decreased back down to 130's  6/8 - Son report blood sugars have remained stable over the last few weeks  7/8 - Per daughter, member's blood sugar average less than 140, report he is doing well  8/4 -  Member's blood sugars decreased, range low 100's. Daughter report appetite has decreased, encouraged to try supplements (glucerna)     THN - Set My Target A1C   On track    Barriers: Health Behaviors   Timeframe:  Long-Range Goal Priority:  Medium Start Date:       7/8     (goal restarted)  Expected End Date:   11/8          - set target A1C (7)    Why is this important?   Your target A1C is decided together by you and your doctor.  It is based on several things like your age and other health issues.    Notes:   11/18 - Discussed dose change for oral diabetic agents  12/28 - Call placed to PCP office requesting order for new A1C lab  1/28 - Re-educated on DM management in effort to decrease A1C  2/21 - Review CBG trends, encouraged to continue plan of care  3/21 - Reminded son that controlled CBG's will promote wound healing  4/18 - Most recent A1C - 7.5, son verbalized understanding of ongoing management of blood sugars and consistent A1C within range  5/16 - Member has increased his mobility with outpatient PT, encouraged to continue as increased exercise helps to decrease blood sugars and A1C  6/8 - Goal extended, follow up with PCP on 6/13.     7/8 - Continues to work on improving A1C, daughter now retired and will be working with member more often.    8/4 - Daughter report member's blood sugars have decreased, will have A1C repeated during next visit       [name], Therapist, sports, MSN [place] [phone]

## 2020-08-23 DIAGNOSIS — I480 Paroxysmal atrial fibrillation: Secondary | ICD-10-CM | POA: Diagnosis not present

## 2020-08-23 DIAGNOSIS — E1122 Type 2 diabetes mellitus with diabetic chronic kidney disease: Secondary | ICD-10-CM | POA: Diagnosis not present

## 2020-08-23 DIAGNOSIS — E785 Hyperlipidemia, unspecified: Secondary | ICD-10-CM | POA: Diagnosis not present

## 2020-08-23 DIAGNOSIS — I129 Hypertensive chronic kidney disease with stage 1 through stage 4 chronic kidney disease, or unspecified chronic kidney disease: Secondary | ICD-10-CM | POA: Diagnosis not present

## 2020-08-23 DIAGNOSIS — Z79891 Long term (current) use of opiate analgesic: Secondary | ICD-10-CM | POA: Diagnosis not present

## 2020-08-23 DIAGNOSIS — I252 Old myocardial infarction: Secondary | ICD-10-CM | POA: Diagnosis not present

## 2020-08-23 DIAGNOSIS — L89312 Pressure ulcer of right buttock, stage 2: Secondary | ICD-10-CM | POA: Diagnosis not present

## 2020-08-23 DIAGNOSIS — E039 Hypothyroidism, unspecified: Secondary | ICD-10-CM | POA: Diagnosis not present

## 2020-08-23 DIAGNOSIS — Z7902 Long term (current) use of antithrombotics/antiplatelets: Secondary | ICD-10-CM | POA: Diagnosis not present

## 2020-08-23 DIAGNOSIS — Z466 Encounter for fitting and adjustment of urinary device: Secondary | ICD-10-CM | POA: Diagnosis not present

## 2020-08-23 DIAGNOSIS — E1151 Type 2 diabetes mellitus with diabetic peripheral angiopathy without gangrene: Secondary | ICD-10-CM | POA: Diagnosis not present

## 2020-08-23 DIAGNOSIS — N170 Acute kidney failure with tubular necrosis: Secondary | ICD-10-CM | POA: Diagnosis not present

## 2020-08-23 DIAGNOSIS — G4730 Sleep apnea, unspecified: Secondary | ICD-10-CM | POA: Diagnosis not present

## 2020-08-23 DIAGNOSIS — H409 Unspecified glaucoma: Secondary | ICD-10-CM | POA: Diagnosis not present

## 2020-08-23 DIAGNOSIS — I499 Cardiac arrhythmia, unspecified: Secondary | ICD-10-CM | POA: Diagnosis not present

## 2020-08-23 DIAGNOSIS — N189 Chronic kidney disease, unspecified: Secondary | ICD-10-CM | POA: Diagnosis not present

## 2020-08-23 DIAGNOSIS — E1129 Type 2 diabetes mellitus with other diabetic kidney complication: Secondary | ICD-10-CM | POA: Diagnosis not present

## 2020-08-23 DIAGNOSIS — I5032 Chronic diastolic (congestive) heart failure: Secondary | ICD-10-CM | POA: Diagnosis not present

## 2020-08-23 DIAGNOSIS — Z7901 Long term (current) use of anticoagulants: Secondary | ICD-10-CM | POA: Diagnosis not present

## 2020-08-23 DIAGNOSIS — I350 Nonrheumatic aortic (valve) stenosis: Secondary | ICD-10-CM | POA: Diagnosis not present

## 2020-08-23 DIAGNOSIS — R338 Other retention of urine: Secondary | ICD-10-CM | POA: Diagnosis not present

## 2020-08-23 DIAGNOSIS — I130 Hypertensive heart and chronic kidney disease with heart failure and stage 1 through stage 4 chronic kidney disease, or unspecified chronic kidney disease: Secondary | ICD-10-CM | POA: Diagnosis not present

## 2020-08-23 DIAGNOSIS — L89322 Pressure ulcer of left buttock, stage 2: Secondary | ICD-10-CM | POA: Diagnosis not present

## 2020-08-23 DIAGNOSIS — R809 Proteinuria, unspecified: Secondary | ICD-10-CM | POA: Diagnosis not present

## 2020-08-23 DIAGNOSIS — Z87440 Personal history of urinary (tract) infections: Secondary | ICD-10-CM | POA: Diagnosis not present

## 2020-08-23 DIAGNOSIS — Z7984 Long term (current) use of oral hypoglycemic drugs: Secondary | ICD-10-CM | POA: Diagnosis not present

## 2020-08-23 DIAGNOSIS — I5033 Acute on chronic diastolic (congestive) heart failure: Secondary | ICD-10-CM | POA: Diagnosis not present

## 2020-08-23 DIAGNOSIS — I4510 Unspecified right bundle-branch block: Secondary | ICD-10-CM | POA: Diagnosis not present

## 2020-08-23 DIAGNOSIS — N184 Chronic kidney disease, stage 4 (severe): Secondary | ICD-10-CM | POA: Diagnosis not present

## 2020-08-23 DIAGNOSIS — U071 COVID-19: Secondary | ICD-10-CM | POA: Diagnosis not present

## 2020-08-23 DIAGNOSIS — D638 Anemia in other chronic diseases classified elsewhere: Secondary | ICD-10-CM | POA: Diagnosis not present

## 2020-08-24 ENCOUNTER — Other Ambulatory Visit: Payer: Self-pay

## 2020-08-24 ENCOUNTER — Other Ambulatory Visit: Payer: Medicare Other

## 2020-08-24 ENCOUNTER — Other Ambulatory Visit: Payer: Medicare Other | Admitting: *Deleted

## 2020-08-24 VITALS — BP 152/78 | HR 54 | Temp 97.6°F | Resp 15

## 2020-08-24 DIAGNOSIS — Z515 Encounter for palliative care: Secondary | ICD-10-CM

## 2020-08-24 NOTE — Progress Notes (Signed)
[facility] PALLIATIVE CARE RN NOTE  PATIENT NAME: [name] DOB: 09-Jun-1927 MRN: [id number]  PRIMARY CARE PROVIDER: Redmond School, MD  RESPONSIBLE PARTY:  Acct ID - Guarantor Home Phone Work Phone Relationship Acct Type  [id number] ROCKET, [phone]  Self P/F     [place], [place], [place] [address]   Covid-19 Pre-screening Negative  PLAN OF CARE and INTERVENTION:  [facility] CARE PLANNING/GOALS OF CARE: Goal is for patient to remain at home. He is a Full code. PATIENT/CAREGIVER EDUCATION: Symptom management, safe transfers, s/s of infection DISEASE STATUS: Joint palliative care follow-up visit completed with LCSW, M. Lonon. Met with patient and his daughter in his home. Patient is lying in bed awake. Pleasant mood. He is able to answer some simple questions with short replies but is becoming slower to process. He celebrated his birthday yesterday. He denies pain and shortness of breath. No coughing. He is transferred via [place] and transported via wheelchair. He was recently hospitalized from 08/09/20 - 08/11/20 after daughter noticed a change in his mental status and increased sleep. Patient had Covid-19 and a UTI. He was discharged with oral Cipro. He will start back on his Keflex for UTI prophylaxis tomorrow. Daughter states that he continues to sleep on and off throughout much of the day since his hospitalizations, but remains easily arousable. Foley catheter draining clear yellow urine. She is working on pushing more fluids. Bowel movements are regular. He takes a stool softener. LBM 08/23/20. His appetite has also declined some since he has been home. She has also having to feed patient, which is new since he returned home. He was able to eat a sub sandwich by himself, but was very difficult for him. He was seen by his PCP last week s/p hospital discharge. No changes made to plan or care. He saw his Nephrologist yesterday. Daughter says his kidneys are bad  but not to the point of dialysis just yet. He is not a candidate for dialysis when kidney function reaches that point. A referral has been sent to infectious disease and daughter is waiting for call from them to schedule this appointment. He has an appointment with urology on 09/05/20 and another follow up with nephrology on 09/07/20. He has a stage 2 pressure injury to left and right buttocks near the crease. RN with [facility] visits weekly for dressing changes and management of wound. Will continue to monitor.   HISTORY OF PRESENT ILLNESS: This is a 87 yo male with a history of CHF, moderate aortic stenosis, Afib, DM II, CKD Stage 4 and chronic foley catheter. Palliative care team continues to follow patient for additional support, goals of care and complex decision making.   CODE STATUS: Full code ADVANCED DIRECTIVES: Y MOST FORM: no PPS: 30%   PHYSICAL EXAM:   VITALS: Today's Vitals   08/24/20 1123  BP: (!) 152/78  Pulse: (!) 54  Resp: 15  Temp: 97.6 F (36.4 C)  TempSrc: Temporal  SpO2: 95%  PainSc: 0-No pain    LUNGS: clear to auscultation  CARDIAC: Cor Loletha Grayer EXTREMITIES: No edema SKIN:  Scattered bruising noted to both arms; Stage 2 pressure injury to left and right buttocks near folds   NEURO:  Alert and oriented to person/place, pleasant mood, increased generalized weakness, wheelchair bound   (Duration of visit and documentation 45 minutes)   Daryl Eastern, RN BSN

## 2020-08-27 ENCOUNTER — Other Ambulatory Visit: Payer: Self-pay | Admitting: Internal Medicine

## 2020-08-29 NOTE — Progress Notes (Signed)
[facility] SW NOTE  PATIENT NAME: [name] DOB: 1927/07/23 MRN: [id number]  PRIMARY CARE PROVIDER: Redmond School, MD  RESPONSIBLE PARTY:  Acct ID - Guarantor Home Phone Work Phone Relationship Acct Type  [id number] RYMAN, [phone]  Self P/F     [place], [place], [place] [address]     PLAN OF CARE and INTERVENTIONS:             GOALS OF CARE/ ADVANCE CARE PLANNING:  Goal is for patient to remain at home. Patient is a Full Code.  SOCIAL/EMOTIONAL/SPIRITUAL ASSESSMENT/ INTERVENTIONS:  SW and RN completed a visit with patient at his home, where he was present with his daughter-[name]. Patient was in bed bed, initially awake and greeted the team warmly. He was verbally responsive to simple yes/no questions. Patient eventually fell asleep during this visit while the team received a status update from his daughter. Patient celebrated a birthday yesterday that the daughter feels may have tired patient out. Patient had a recent hospitalization, 7/27-7/29 due to a change in his mental status and increased sleep. Patient was diagnosed with COVID-19 and a UTI and was treated with ABT. And he continues with ABT to treat his UTI. His daughter has noted that patient's napping throughout the day has increased. He has a foley catheter that is draining with no problem. He has appetite has declined overall. His daughter is feeding him and he can occasionally pickup finger foods. Patient has a stage 2 pressure area to left and right buttocks that is being monitored and dressed weekly by [facility] RN. Patient has some swelling to his right hand. He also is a double amputee. SOCIAL HISTORY: Patient is a [place] [employer] veteran and the family is waiting on feedback on submitted paperwork for services. He worked as a Tax inspector. He has been widowed since 2009. He has three children. He is a [place] and still attends church. Patient's children take turns staying  with him a week at time. Patient's daughter remain open to ongoing palliative care visits/support.  PATIENT/CAREGIVER EDUCATION/ COPING:  PCG appears to be coping well. Patient was in good spirits prior to falling asleep. Patient is sleeping more overall.  PERSONAL EMERGENCY PLAN:  911 can be activated for emergencies. COMMUNITY RESOURCES COORDINATION/ HEALTH CARE NAVIGATION:  [facility] is provide wound care. FINANCIAL/LEGAL CONCERNS/INTERVENTIONS:  None     SOCIAL HX:  Social History   Tobacco Use   Smoking status: Former   Smokeless tobacco: Former    Types: Chew  Substance Use Topics   Alcohol use: No    CODE STATUS: Full Code ADVANCED DIRECTIVES: Yes MOST FORM COMPLETE:  No HOSPICE EDUCATION PROVIDED: No  PPS: Patient is bedbound, and is transferred with a hoyer lift. He is sleeping more.  Duration of visit and documentation: 60 minutes  Katheren Puller, LCSW

## 2020-08-30 DIAGNOSIS — U071 COVID-19: Secondary | ICD-10-CM | POA: Diagnosis not present

## 2020-08-30 DIAGNOSIS — I252 Old myocardial infarction: Secondary | ICD-10-CM | POA: Diagnosis not present

## 2020-08-30 DIAGNOSIS — R338 Other retention of urine: Secondary | ICD-10-CM | POA: Diagnosis not present

## 2020-08-30 DIAGNOSIS — E1151 Type 2 diabetes mellitus with diabetic peripheral angiopathy without gangrene: Secondary | ICD-10-CM | POA: Diagnosis not present

## 2020-08-30 DIAGNOSIS — Z87440 Personal history of urinary (tract) infections: Secondary | ICD-10-CM | POA: Diagnosis not present

## 2020-08-30 DIAGNOSIS — E039 Hypothyroidism, unspecified: Secondary | ICD-10-CM | POA: Diagnosis not present

## 2020-08-30 DIAGNOSIS — I130 Hypertensive heart and chronic kidney disease with heart failure and stage 1 through stage 4 chronic kidney disease, or unspecified chronic kidney disease: Secondary | ICD-10-CM | POA: Diagnosis not present

## 2020-08-30 DIAGNOSIS — Z79891 Long term (current) use of opiate analgesic: Secondary | ICD-10-CM | POA: Diagnosis not present

## 2020-08-30 DIAGNOSIS — I499 Cardiac arrhythmia, unspecified: Secondary | ICD-10-CM | POA: Diagnosis not present

## 2020-08-30 DIAGNOSIS — Z7984 Long term (current) use of oral hypoglycemic drugs: Secondary | ICD-10-CM | POA: Diagnosis not present

## 2020-08-30 DIAGNOSIS — G4730 Sleep apnea, unspecified: Secondary | ICD-10-CM | POA: Diagnosis not present

## 2020-08-30 DIAGNOSIS — E785 Hyperlipidemia, unspecified: Secondary | ICD-10-CM | POA: Diagnosis not present

## 2020-08-30 DIAGNOSIS — I350 Nonrheumatic aortic (valve) stenosis: Secondary | ICD-10-CM | POA: Diagnosis not present

## 2020-08-30 DIAGNOSIS — Z7902 Long term (current) use of antithrombotics/antiplatelets: Secondary | ICD-10-CM | POA: Diagnosis not present

## 2020-08-30 DIAGNOSIS — I5033 Acute on chronic diastolic (congestive) heart failure: Secondary | ICD-10-CM | POA: Diagnosis not present

## 2020-08-30 DIAGNOSIS — L89322 Pressure ulcer of left buttock, stage 2: Secondary | ICD-10-CM | POA: Diagnosis not present

## 2020-08-30 DIAGNOSIS — Z7901 Long term (current) use of anticoagulants: Secondary | ICD-10-CM | POA: Diagnosis not present

## 2020-08-30 DIAGNOSIS — I4510 Unspecified right bundle-branch block: Secondary | ICD-10-CM | POA: Diagnosis not present

## 2020-08-30 DIAGNOSIS — H409 Unspecified glaucoma: Secondary | ICD-10-CM | POA: Diagnosis not present

## 2020-08-30 DIAGNOSIS — I480 Paroxysmal atrial fibrillation: Secondary | ICD-10-CM | POA: Diagnosis not present

## 2020-08-30 DIAGNOSIS — N184 Chronic kidney disease, stage 4 (severe): Secondary | ICD-10-CM | POA: Diagnosis not present

## 2020-08-30 DIAGNOSIS — E1122 Type 2 diabetes mellitus with diabetic chronic kidney disease: Secondary | ICD-10-CM | POA: Diagnosis not present

## 2020-08-30 DIAGNOSIS — Z466 Encounter for fitting and adjustment of urinary device: Secondary | ICD-10-CM | POA: Diagnosis not present

## 2020-08-30 DIAGNOSIS — L89312 Pressure ulcer of right buttock, stage 2: Secondary | ICD-10-CM | POA: Diagnosis not present

## 2020-08-31 ENCOUNTER — Ambulatory Visit: Payer: Medicare Other | Admitting: Internal Medicine

## 2020-08-31 ENCOUNTER — Encounter: Payer: Self-pay | Admitting: Internal Medicine

## 2020-08-31 ENCOUNTER — Other Ambulatory Visit: Payer: Self-pay

## 2020-08-31 VITALS — BP 157/75 | HR 54 | Temp 97.6°F

## 2020-08-31 DIAGNOSIS — R8290 Unspecified abnormal findings in urine: Secondary | ICD-10-CM | POA: Diagnosis not present

## 2020-08-31 DIAGNOSIS — R339 Retention of urine, unspecified: Secondary | ICD-10-CM | POA: Diagnosis not present

## 2020-08-31 DIAGNOSIS — N184 Chronic kidney disease, stage 4 (severe): Secondary | ICD-10-CM | POA: Diagnosis not present

## 2020-08-31 NOTE — Progress Notes (Signed)
   Subjective:    Patient ID: [name], male    DOB: 1928-01-11, 87 y.o.   MRN: [id number]  HPI Here for a follow up a recent hospitalization. He is re-referred here by his nephrologist for concern for UTI.  He was seen by my partner Dr. West Bali in March for the same concern.  Last month he was hospitalized at [facility] with about 2-3 weeks of increased somnolence and poor po intake.  He was found to be COVID-19 positive and also felt to have a urinary tract infection.  He though was afebrile, no leukocytosis and had no complaints of pelvic pain, or other associated urinary complaints.  He was treated with cipro after receiving a day of cefepime.  He is here today with his daughter and is getting back to more of his baseline.  She provides all the history.  He has chronic renal insufficiency and his BUN is elevated at baseline.  He presented to the hospital last month with prerenal azotemia.  He has a chronic foley in place, changed monthly. He has underlying dementia with microvascular changes on recent MRI and CTs.    Review of Systems  Constitutional:  Negative for chills, fatigue and fever.  Gastrointestinal:  Negative for diarrhea.  Genitourinary:  Negative for dysuria, flank pain and hematuria.      Objective:   Physical Exam Eyes:     General: No scleral icterus. Pulmonary:     Effort: Pulmonary effort is normal.  Skin:    Findings: No rash.  Neurological:     Mental Status: He is alert.  Psychiatric:        Mood and Affect: Mood normal.          Assessment & Plan:

## 2020-08-31 NOTE — Assessment & Plan Note (Signed)
I discussed with him and his daughter concerning symptoms of a urinary infection including a fever of 101 or above, leukocytosis associated with some urinary complaints or pelvic pain.  It can be difficult in someone with underlying dementia to determine if he has an active urine infection but without any otherwise objective evidence of infection, no indication for antibiotic treatment.  I emphasized that altered mental status is not a sign of an active UTI by itself and only if he has underlying systemic symptoms associated with it (ie fever, leukocytosis, sepsis).  I reviewed his recent hospitalization and he had no objective or subjective indications for a UTI but more c/w uremia and dehydration.  I have also advised against the use of oral daily suppressive antibiotics as there is no evidence in reducing the frequency of UTIs.   30 minutes spent with the patient and his daughter and review of the recent hospitalization and the previous ID consultation

## 2020-09-05 DIAGNOSIS — R809 Proteinuria, unspecified: Secondary | ICD-10-CM | POA: Diagnosis not present

## 2020-09-05 DIAGNOSIS — N170 Acute kidney failure with tubular necrosis: Secondary | ICD-10-CM | POA: Diagnosis not present

## 2020-09-05 DIAGNOSIS — L89312 Pressure ulcer of right buttock, stage 2: Secondary | ICD-10-CM | POA: Diagnosis not present

## 2020-09-05 DIAGNOSIS — Z466 Encounter for fitting and adjustment of urinary device: Secondary | ICD-10-CM | POA: Diagnosis not present

## 2020-09-05 DIAGNOSIS — R3914 Feeling of incomplete bladder emptying: Secondary | ICD-10-CM | POA: Diagnosis not present

## 2020-09-05 DIAGNOSIS — E1122 Type 2 diabetes mellitus with diabetic chronic kidney disease: Secondary | ICD-10-CM | POA: Diagnosis not present

## 2020-09-05 DIAGNOSIS — N189 Chronic kidney disease, unspecified: Secondary | ICD-10-CM | POA: Diagnosis not present

## 2020-09-05 DIAGNOSIS — E1129 Type 2 diabetes mellitus with other diabetic kidney complication: Secondary | ICD-10-CM | POA: Diagnosis not present

## 2020-09-05 DIAGNOSIS — L89322 Pressure ulcer of left buttock, stage 2: Secondary | ICD-10-CM | POA: Diagnosis not present

## 2020-09-05 DIAGNOSIS — U071 COVID-19: Secondary | ICD-10-CM | POA: Diagnosis not present

## 2020-09-06 DIAGNOSIS — L89322 Pressure ulcer of left buttock, stage 2: Secondary | ICD-10-CM | POA: Diagnosis not present

## 2020-09-06 DIAGNOSIS — Z466 Encounter for fitting and adjustment of urinary device: Secondary | ICD-10-CM | POA: Diagnosis not present

## 2020-09-06 DIAGNOSIS — I480 Paroxysmal atrial fibrillation: Secondary | ICD-10-CM | POA: Diagnosis not present

## 2020-09-06 DIAGNOSIS — Z7901 Long term (current) use of anticoagulants: Secondary | ICD-10-CM | POA: Diagnosis not present

## 2020-09-06 DIAGNOSIS — E039 Hypothyroidism, unspecified: Secondary | ICD-10-CM | POA: Diagnosis not present

## 2020-09-06 DIAGNOSIS — I499 Cardiac arrhythmia, unspecified: Secondary | ICD-10-CM | POA: Diagnosis not present

## 2020-09-06 DIAGNOSIS — U071 COVID-19: Secondary | ICD-10-CM | POA: Diagnosis not present

## 2020-09-06 DIAGNOSIS — G4730 Sleep apnea, unspecified: Secondary | ICD-10-CM | POA: Diagnosis not present

## 2020-09-06 DIAGNOSIS — E1122 Type 2 diabetes mellitus with diabetic chronic kidney disease: Secondary | ICD-10-CM | POA: Diagnosis not present

## 2020-09-06 DIAGNOSIS — Z7984 Long term (current) use of oral hypoglycemic drugs: Secondary | ICD-10-CM | POA: Diagnosis not present

## 2020-09-06 DIAGNOSIS — N184 Chronic kidney disease, stage 4 (severe): Secondary | ICD-10-CM | POA: Diagnosis not present

## 2020-09-06 DIAGNOSIS — I130 Hypertensive heart and chronic kidney disease with heart failure and stage 1 through stage 4 chronic kidney disease, or unspecified chronic kidney disease: Secondary | ICD-10-CM | POA: Diagnosis not present

## 2020-09-06 DIAGNOSIS — I252 Old myocardial infarction: Secondary | ICD-10-CM | POA: Diagnosis not present

## 2020-09-06 DIAGNOSIS — Z87440 Personal history of urinary (tract) infections: Secondary | ICD-10-CM | POA: Diagnosis not present

## 2020-09-06 DIAGNOSIS — Z79891 Long term (current) use of opiate analgesic: Secondary | ICD-10-CM | POA: Diagnosis not present

## 2020-09-06 DIAGNOSIS — E1151 Type 2 diabetes mellitus with diabetic peripheral angiopathy without gangrene: Secondary | ICD-10-CM | POA: Diagnosis not present

## 2020-09-06 DIAGNOSIS — I350 Nonrheumatic aortic (valve) stenosis: Secondary | ICD-10-CM | POA: Diagnosis not present

## 2020-09-06 DIAGNOSIS — I5033 Acute on chronic diastolic (congestive) heart failure: Secondary | ICD-10-CM | POA: Diagnosis not present

## 2020-09-06 DIAGNOSIS — R338 Other retention of urine: Secondary | ICD-10-CM | POA: Diagnosis not present

## 2020-09-06 DIAGNOSIS — Z7902 Long term (current) use of antithrombotics/antiplatelets: Secondary | ICD-10-CM | POA: Diagnosis not present

## 2020-09-06 DIAGNOSIS — E785 Hyperlipidemia, unspecified: Secondary | ICD-10-CM | POA: Diagnosis not present

## 2020-09-06 DIAGNOSIS — L89312 Pressure ulcer of right buttock, stage 2: Secondary | ICD-10-CM | POA: Diagnosis not present

## 2020-09-06 DIAGNOSIS — I4510 Unspecified right bundle-branch block: Secondary | ICD-10-CM | POA: Diagnosis not present

## 2020-09-06 DIAGNOSIS — H409 Unspecified glaucoma: Secondary | ICD-10-CM | POA: Diagnosis not present

## 2020-09-07 DIAGNOSIS — N189 Chronic kidney disease, unspecified: Secondary | ICD-10-CM | POA: Diagnosis not present

## 2020-09-07 DIAGNOSIS — E1129 Type 2 diabetes mellitus with other diabetic kidney complication: Secondary | ICD-10-CM | POA: Diagnosis not present

## 2020-09-07 DIAGNOSIS — E1122 Type 2 diabetes mellitus with diabetic chronic kidney disease: Secondary | ICD-10-CM | POA: Diagnosis not present

## 2020-09-07 DIAGNOSIS — N170 Acute kidney failure with tubular necrosis: Secondary | ICD-10-CM | POA: Diagnosis not present

## 2020-09-07 DIAGNOSIS — I5032 Chronic diastolic (congestive) heart failure: Secondary | ICD-10-CM | POA: Diagnosis not present

## 2020-09-07 DIAGNOSIS — I129 Hypertensive chronic kidney disease with stage 1 through stage 4 chronic kidney disease, or unspecified chronic kidney disease: Secondary | ICD-10-CM | POA: Diagnosis not present

## 2020-09-07 DIAGNOSIS — D638 Anemia in other chronic diseases classified elsewhere: Secondary | ICD-10-CM | POA: Diagnosis not present

## 2020-09-07 DIAGNOSIS — R809 Proteinuria, unspecified: Secondary | ICD-10-CM | POA: Diagnosis not present

## 2020-09-13 DIAGNOSIS — I5033 Acute on chronic diastolic (congestive) heart failure: Secondary | ICD-10-CM | POA: Diagnosis not present

## 2020-09-13 DIAGNOSIS — Z87440 Personal history of urinary (tract) infections: Secondary | ICD-10-CM | POA: Diagnosis not present

## 2020-09-13 DIAGNOSIS — E7849 Other hyperlipidemia: Secondary | ICD-10-CM | POA: Diagnosis not present

## 2020-09-13 DIAGNOSIS — Z7984 Long term (current) use of oral hypoglycemic drugs: Secondary | ICD-10-CM | POA: Diagnosis not present

## 2020-09-13 DIAGNOSIS — H409 Unspecified glaucoma: Secondary | ICD-10-CM | POA: Diagnosis not present

## 2020-09-13 DIAGNOSIS — I130 Hypertensive heart and chronic kidney disease with heart failure and stage 1 through stage 4 chronic kidney disease, or unspecified chronic kidney disease: Secondary | ICD-10-CM | POA: Diagnosis not present

## 2020-09-13 DIAGNOSIS — I4510 Unspecified right bundle-branch block: Secondary | ICD-10-CM | POA: Diagnosis not present

## 2020-09-13 DIAGNOSIS — E1122 Type 2 diabetes mellitus with diabetic chronic kidney disease: Secondary | ICD-10-CM | POA: Diagnosis not present

## 2020-09-13 DIAGNOSIS — L89322 Pressure ulcer of left buttock, stage 2: Secondary | ICD-10-CM | POA: Diagnosis not present

## 2020-09-13 DIAGNOSIS — I350 Nonrheumatic aortic (valve) stenosis: Secondary | ICD-10-CM | POA: Diagnosis not present

## 2020-09-13 DIAGNOSIS — I480 Paroxysmal atrial fibrillation: Secondary | ICD-10-CM | POA: Diagnosis not present

## 2020-09-13 DIAGNOSIS — I129 Hypertensive chronic kidney disease with stage 1 through stage 4 chronic kidney disease, or unspecified chronic kidney disease: Secondary | ICD-10-CM | POA: Diagnosis not present

## 2020-09-13 DIAGNOSIS — Z7902 Long term (current) use of antithrombotics/antiplatelets: Secondary | ICD-10-CM | POA: Diagnosis not present

## 2020-09-13 DIAGNOSIS — U071 COVID-19: Secondary | ICD-10-CM | POA: Diagnosis not present

## 2020-09-13 DIAGNOSIS — Z79891 Long term (current) use of opiate analgesic: Secondary | ICD-10-CM | POA: Diagnosis not present

## 2020-09-13 DIAGNOSIS — G4730 Sleep apnea, unspecified: Secondary | ICD-10-CM | POA: Diagnosis not present

## 2020-09-13 DIAGNOSIS — I499 Cardiac arrhythmia, unspecified: Secondary | ICD-10-CM | POA: Diagnosis not present

## 2020-09-13 DIAGNOSIS — Z466 Encounter for fitting and adjustment of urinary device: Secondary | ICD-10-CM | POA: Diagnosis not present

## 2020-09-13 DIAGNOSIS — E039 Hypothyroidism, unspecified: Secondary | ICD-10-CM | POA: Diagnosis not present

## 2020-09-13 DIAGNOSIS — E1151 Type 2 diabetes mellitus with diabetic peripheral angiopathy without gangrene: Secondary | ICD-10-CM | POA: Diagnosis not present

## 2020-09-13 DIAGNOSIS — I252 Old myocardial infarction: Secondary | ICD-10-CM | POA: Diagnosis not present

## 2020-09-13 DIAGNOSIS — Z7901 Long term (current) use of anticoagulants: Secondary | ICD-10-CM | POA: Diagnosis not present

## 2020-09-13 DIAGNOSIS — N184 Chronic kidney disease, stage 4 (severe): Secondary | ICD-10-CM | POA: Diagnosis not present

## 2020-09-13 DIAGNOSIS — E785 Hyperlipidemia, unspecified: Secondary | ICD-10-CM | POA: Diagnosis not present

## 2020-09-13 DIAGNOSIS — L89312 Pressure ulcer of right buttock, stage 2: Secondary | ICD-10-CM | POA: Diagnosis not present

## 2020-09-13 DIAGNOSIS — R338 Other retention of urine: Secondary | ICD-10-CM | POA: Diagnosis not present

## 2020-09-15 ENCOUNTER — Other Ambulatory Visit: Payer: Self-pay | Admitting: Physician Assistant

## 2020-09-20 DIAGNOSIS — E039 Hypothyroidism, unspecified: Secondary | ICD-10-CM | POA: Diagnosis not present

## 2020-09-20 DIAGNOSIS — G4730 Sleep apnea, unspecified: Secondary | ICD-10-CM | POA: Diagnosis not present

## 2020-09-20 DIAGNOSIS — E1122 Type 2 diabetes mellitus with diabetic chronic kidney disease: Secondary | ICD-10-CM | POA: Diagnosis not present

## 2020-09-20 DIAGNOSIS — I480 Paroxysmal atrial fibrillation: Secondary | ICD-10-CM | POA: Diagnosis not present

## 2020-09-20 DIAGNOSIS — I5033 Acute on chronic diastolic (congestive) heart failure: Secondary | ICD-10-CM | POA: Diagnosis not present

## 2020-09-20 DIAGNOSIS — Z7984 Long term (current) use of oral hypoglycemic drugs: Secondary | ICD-10-CM | POA: Diagnosis not present

## 2020-09-20 DIAGNOSIS — E1151 Type 2 diabetes mellitus with diabetic peripheral angiopathy without gangrene: Secondary | ICD-10-CM | POA: Diagnosis not present

## 2020-09-20 DIAGNOSIS — Z79891 Long term (current) use of opiate analgesic: Secondary | ICD-10-CM | POA: Diagnosis not present

## 2020-09-20 DIAGNOSIS — U071 COVID-19: Secondary | ICD-10-CM | POA: Diagnosis not present

## 2020-09-20 DIAGNOSIS — Z87440 Personal history of urinary (tract) infections: Secondary | ICD-10-CM | POA: Diagnosis not present

## 2020-09-20 DIAGNOSIS — L89312 Pressure ulcer of right buttock, stage 2: Secondary | ICD-10-CM | POA: Diagnosis not present

## 2020-09-20 DIAGNOSIS — Z7901 Long term (current) use of anticoagulants: Secondary | ICD-10-CM | POA: Diagnosis not present

## 2020-09-20 DIAGNOSIS — R338 Other retention of urine: Secondary | ICD-10-CM | POA: Diagnosis not present

## 2020-09-20 DIAGNOSIS — I252 Old myocardial infarction: Secondary | ICD-10-CM | POA: Diagnosis not present

## 2020-09-20 DIAGNOSIS — I499 Cardiac arrhythmia, unspecified: Secondary | ICD-10-CM | POA: Diagnosis not present

## 2020-09-20 DIAGNOSIS — Z466 Encounter for fitting and adjustment of urinary device: Secondary | ICD-10-CM | POA: Diagnosis not present

## 2020-09-20 DIAGNOSIS — N184 Chronic kidney disease, stage 4 (severe): Secondary | ICD-10-CM | POA: Diagnosis not present

## 2020-09-20 DIAGNOSIS — Z7902 Long term (current) use of antithrombotics/antiplatelets: Secondary | ICD-10-CM | POA: Diagnosis not present

## 2020-09-20 DIAGNOSIS — I130 Hypertensive heart and chronic kidney disease with heart failure and stage 1 through stage 4 chronic kidney disease, or unspecified chronic kidney disease: Secondary | ICD-10-CM | POA: Diagnosis not present

## 2020-09-20 DIAGNOSIS — I350 Nonrheumatic aortic (valve) stenosis: Secondary | ICD-10-CM | POA: Diagnosis not present

## 2020-09-20 DIAGNOSIS — E785 Hyperlipidemia, unspecified: Secondary | ICD-10-CM | POA: Diagnosis not present

## 2020-09-20 DIAGNOSIS — H409 Unspecified glaucoma: Secondary | ICD-10-CM | POA: Diagnosis not present

## 2020-09-20 DIAGNOSIS — I4510 Unspecified right bundle-branch block: Secondary | ICD-10-CM | POA: Diagnosis not present

## 2020-09-20 DIAGNOSIS — L89322 Pressure ulcer of left buttock, stage 2: Secondary | ICD-10-CM | POA: Diagnosis not present

## 2020-09-21 ENCOUNTER — Other Ambulatory Visit: Payer: Self-pay | Admitting: *Deleted

## 2020-09-21 NOTE — Patient Outreach (Signed)
[facility]) Care Management  09/21/2020  [name] 10-May-1927 [id number]   Outgoing call placed to daughter, state member is doing better, now stable.  He has not shown recurrent signs/symptoms of UTI, not on chronic antibiotics.  He has been seen by Nephrology, ID, and Palliative Care.  Does not have current appointment with PCP but daughter will call if he need to go.  He will follow up as usually scheduled, every 6 months.  Continues with [facility] for RN and PT.  Denies any urgent concerns, encouraged to contact this care manager with questions.  Agrees to follow up within the next month.   Goals Addressed             This Visit's Progress    COMPLETED: [facility] - Monitor and Manage My Blood Sugar   On track    Timeframe:  Short-Term Goal Priority:  Medium Start Date:          5/16 Expected End Date:  8/16  Barriers: Other - Cognitive, dementia                   - check blood sugar at prescribed times - check blood sugar if I feel it is too high or too low - take the blood sugar log to all doctor visits    Why is this important?   Checking your blood sugar at home helps to keep it from getting very high or very low.  Writing the results in a diary or log helps the doctor know how to care for you.  Your blood sugar log should have the time, date and the results.  Also, write down the amount of insulin or other medicine that you take.  Other information, like what you ate, exercise done and how you were feeling, will also be helpful.     Notes:   11/18 - blood sugar readings reviewed, discussed compliance with medication regime  12/28 - Discussed proper diabetic and low sodium diet  02/11/2020 - Remind of importance of managing blood sugars in correlation to wound healing  2/21 - Confirmed member has ongoing participation with home health for weekly monitoring  3/21 - Encouraged to continue daily blood sugar monitoring as well as recording readings  4/18 -  Son report blood sugars range 130-170's, was 219 today.  Relates this to increased appetite and eating over the Easter holiday.  5/16 - Blood sugars increased to around 300's when on antibiotics for UTI, now decreased back down to 130's  6/8 - Son report blood sugars have remained stable over the last few weeks  7/8 - Per daughter, member's blood sugar average less than 140, report he is doing well  8/4 -  Member's blood sugars decreased, range low 100's. Daughter report appetite has decreased, encouraged to try supplements (glucerna)  9/8 - Better blood sugar control over the last month.       THN - Set My Target A1C   On track    Barriers: Health Behaviors   Timeframe:  Long-Range Goal Priority:  Medium Start Date:       7/8     (goal restarted)                Expected End Date:   11/8          - set target A1C (7)    Why is this important?   Your target A1C is decided together by you and your doctor.  It  is based on several things like your age and other health issues.    Notes:   11/18 - Discussed dose change for oral diabetic agents  12/28 - Call placed to PCP office requesting order for new A1C lab  1/28 - Re-educated on DM management in effort to decrease A1C  2/21 - Review CBG trends, encouraged to continue plan of care  3/21 - Reminded son that controlled CBG's will promote wound healing  4/18 - Most recent A1C - 7.5, son verbalized understanding of ongoing management of blood sugars and consistent A1C within range  5/16 - Member has increased his mobility with outpatient PT, encouraged to continue as increased exercise helps to decrease blood sugars and A1C  6/8 - Goal extended, follow up with PCP on 6/13.    7/8 - Continues to work on improving A1C, daughter now retired and will be working with member more often.    8/4 - Daughter report member's blood sugars have decreased, will have A1C repeated during next visit  9/8 - Most recent A1C = 7.1.  Daughter  state blood sugars are better, ranging 90-110's.         [name], [place], MSN [place] [phone]

## 2020-09-28 DIAGNOSIS — Z7984 Long term (current) use of oral hypoglycemic drugs: Secondary | ICD-10-CM | POA: Diagnosis not present

## 2020-09-28 DIAGNOSIS — R338 Other retention of urine: Secondary | ICD-10-CM | POA: Diagnosis not present

## 2020-09-28 DIAGNOSIS — L89312 Pressure ulcer of right buttock, stage 2: Secondary | ICD-10-CM | POA: Diagnosis not present

## 2020-09-28 DIAGNOSIS — I499 Cardiac arrhythmia, unspecified: Secondary | ICD-10-CM | POA: Diagnosis not present

## 2020-09-28 DIAGNOSIS — I5033 Acute on chronic diastolic (congestive) heart failure: Secondary | ICD-10-CM | POA: Diagnosis not present

## 2020-09-28 DIAGNOSIS — E785 Hyperlipidemia, unspecified: Secondary | ICD-10-CM | POA: Diagnosis not present

## 2020-09-28 DIAGNOSIS — E1151 Type 2 diabetes mellitus with diabetic peripheral angiopathy without gangrene: Secondary | ICD-10-CM | POA: Diagnosis not present

## 2020-09-28 DIAGNOSIS — I130 Hypertensive heart and chronic kidney disease with heart failure and stage 1 through stage 4 chronic kidney disease, or unspecified chronic kidney disease: Secondary | ICD-10-CM | POA: Diagnosis not present

## 2020-09-28 DIAGNOSIS — Z79891 Long term (current) use of opiate analgesic: Secondary | ICD-10-CM | POA: Diagnosis not present

## 2020-09-28 DIAGNOSIS — U071 COVID-19: Secondary | ICD-10-CM | POA: Diagnosis not present

## 2020-09-28 DIAGNOSIS — N184 Chronic kidney disease, stage 4 (severe): Secondary | ICD-10-CM | POA: Diagnosis not present

## 2020-09-28 DIAGNOSIS — I252 Old myocardial infarction: Secondary | ICD-10-CM | POA: Diagnosis not present

## 2020-09-28 DIAGNOSIS — I480 Paroxysmal atrial fibrillation: Secondary | ICD-10-CM | POA: Diagnosis not present

## 2020-09-28 DIAGNOSIS — I4510 Unspecified right bundle-branch block: Secondary | ICD-10-CM | POA: Diagnosis not present

## 2020-09-28 DIAGNOSIS — Z87440 Personal history of urinary (tract) infections: Secondary | ICD-10-CM | POA: Diagnosis not present

## 2020-09-28 DIAGNOSIS — H409 Unspecified glaucoma: Secondary | ICD-10-CM | POA: Diagnosis not present

## 2020-09-28 DIAGNOSIS — Z7901 Long term (current) use of anticoagulants: Secondary | ICD-10-CM | POA: Diagnosis not present

## 2020-09-28 DIAGNOSIS — G4730 Sleep apnea, unspecified: Secondary | ICD-10-CM | POA: Diagnosis not present

## 2020-09-28 DIAGNOSIS — E1122 Type 2 diabetes mellitus with diabetic chronic kidney disease: Secondary | ICD-10-CM | POA: Diagnosis not present

## 2020-09-28 DIAGNOSIS — Z7902 Long term (current) use of antithrombotics/antiplatelets: Secondary | ICD-10-CM | POA: Diagnosis not present

## 2020-09-28 DIAGNOSIS — L89322 Pressure ulcer of left buttock, stage 2: Secondary | ICD-10-CM | POA: Diagnosis not present

## 2020-09-28 DIAGNOSIS — Z466 Encounter for fitting and adjustment of urinary device: Secondary | ICD-10-CM | POA: Diagnosis not present

## 2020-09-28 DIAGNOSIS — E039 Hypothyroidism, unspecified: Secondary | ICD-10-CM | POA: Diagnosis not present

## 2020-09-28 DIAGNOSIS — I350 Nonrheumatic aortic (valve) stenosis: Secondary | ICD-10-CM | POA: Diagnosis not present

## 2020-10-04 DIAGNOSIS — I252 Old myocardial infarction: Secondary | ICD-10-CM | POA: Diagnosis not present

## 2020-10-04 DIAGNOSIS — U071 COVID-19: Secondary | ICD-10-CM | POA: Diagnosis not present

## 2020-10-04 DIAGNOSIS — E039 Hypothyroidism, unspecified: Secondary | ICD-10-CM | POA: Diagnosis not present

## 2020-10-04 DIAGNOSIS — E785 Hyperlipidemia, unspecified: Secondary | ICD-10-CM | POA: Diagnosis not present

## 2020-10-04 DIAGNOSIS — R338 Other retention of urine: Secondary | ICD-10-CM | POA: Diagnosis not present

## 2020-10-04 DIAGNOSIS — Z79891 Long term (current) use of opiate analgesic: Secondary | ICD-10-CM | POA: Diagnosis not present

## 2020-10-04 DIAGNOSIS — I480 Paroxysmal atrial fibrillation: Secondary | ICD-10-CM | POA: Diagnosis not present

## 2020-10-04 DIAGNOSIS — Z7902 Long term (current) use of antithrombotics/antiplatelets: Secondary | ICD-10-CM | POA: Diagnosis not present

## 2020-10-04 DIAGNOSIS — I350 Nonrheumatic aortic (valve) stenosis: Secondary | ICD-10-CM | POA: Diagnosis not present

## 2020-10-04 DIAGNOSIS — I499 Cardiac arrhythmia, unspecified: Secondary | ICD-10-CM | POA: Diagnosis not present

## 2020-10-04 DIAGNOSIS — I4510 Unspecified right bundle-branch block: Secondary | ICD-10-CM | POA: Diagnosis not present

## 2020-10-04 DIAGNOSIS — Z466 Encounter for fitting and adjustment of urinary device: Secondary | ICD-10-CM | POA: Diagnosis not present

## 2020-10-04 DIAGNOSIS — I5033 Acute on chronic diastolic (congestive) heart failure: Secondary | ICD-10-CM | POA: Diagnosis not present

## 2020-10-04 DIAGNOSIS — I130 Hypertensive heart and chronic kidney disease with heart failure and stage 1 through stage 4 chronic kidney disease, or unspecified chronic kidney disease: Secondary | ICD-10-CM | POA: Diagnosis not present

## 2020-10-04 DIAGNOSIS — H409 Unspecified glaucoma: Secondary | ICD-10-CM | POA: Diagnosis not present

## 2020-10-04 DIAGNOSIS — L89312 Pressure ulcer of right buttock, stage 2: Secondary | ICD-10-CM | POA: Diagnosis not present

## 2020-10-04 DIAGNOSIS — Z7901 Long term (current) use of anticoagulants: Secondary | ICD-10-CM | POA: Diagnosis not present

## 2020-10-04 DIAGNOSIS — Z87440 Personal history of urinary (tract) infections: Secondary | ICD-10-CM | POA: Diagnosis not present

## 2020-10-04 DIAGNOSIS — E1151 Type 2 diabetes mellitus with diabetic peripheral angiopathy without gangrene: Secondary | ICD-10-CM | POA: Diagnosis not present

## 2020-10-04 DIAGNOSIS — G4730 Sleep apnea, unspecified: Secondary | ICD-10-CM | POA: Diagnosis not present

## 2020-10-04 DIAGNOSIS — Z7984 Long term (current) use of oral hypoglycemic drugs: Secondary | ICD-10-CM | POA: Diagnosis not present

## 2020-10-04 DIAGNOSIS — L89322 Pressure ulcer of left buttock, stage 2: Secondary | ICD-10-CM | POA: Diagnosis not present

## 2020-10-04 DIAGNOSIS — N184 Chronic kidney disease, stage 4 (severe): Secondary | ICD-10-CM | POA: Diagnosis not present

## 2020-10-04 DIAGNOSIS — E1122 Type 2 diabetes mellitus with diabetic chronic kidney disease: Secondary | ICD-10-CM | POA: Diagnosis not present

## 2020-10-05 ENCOUNTER — Other Ambulatory Visit: Payer: Self-pay

## 2020-10-05 ENCOUNTER — Other Ambulatory Visit: Payer: Self-pay | Admitting: Physician Assistant

## 2020-10-05 ENCOUNTER — Other Ambulatory Visit: Payer: Medicare Other | Admitting: Nurse Practitioner

## 2020-10-05 DIAGNOSIS — Z515 Encounter for palliative care: Secondary | ICD-10-CM | POA: Diagnosis not present

## 2020-10-05 DIAGNOSIS — R5381 Other malaise: Secondary | ICD-10-CM

## 2020-10-05 DIAGNOSIS — I5032 Chronic diastolic (congestive) heart failure: Secondary | ICD-10-CM

## 2020-10-05 NOTE — Telephone Encounter (Signed)
Prescription refill request for Eliquis received. Indication:afib Last office visit:hao meng 07/19/20 Scr:3.68 08/11/20 Age: 12m Weight:98kg

## 2020-10-06 ENCOUNTER — Encounter: Payer: Self-pay | Admitting: Nurse Practitioner

## 2020-10-06 NOTE — Progress Notes (Signed)
[facility] Consult Note Telephone: [phone]  Fax: [phone]    Date of encounter: 10/06/20 10:28 AM PATIENT NAME: JAHRED TATAR [place] [id number]   [phone] (home)  DOB: December 08, 1927 MRN: [id number] PRIMARY CARE PROVIDER:    Redmond School, MD,  [address] [place] [address] [phone]  RESPONSIBLE PARTY:    Contact Information     Name Relation Home Work [place], [place] Son   [phone]   [name] Daughter   [phone]   Myers,Betty Sister [phone]     Rameen, Quinney   [phone]      I met face to face with patient and family in home. Palliative Care was asked to follow this patient by consultation request of  Redmond School, MD to address advance care planning and complex medical decision making. This is a follow up visit.                                   ASSESSMENT AND PLAN / RECOMMENDATIONS: Symptom Management/Plan: 1. Advance Care Planning; Full code, Discussed with son, [name] about option of Hospice benefit under Medicare program. We talked about overall debility, ongoing decline, recurrent UTI's, non-healing wound, weight loss of >50lbs. We talked about what services would be provided and how program works. We talked about Hospice philosophy works. [name] endorses he does not want Hospice, not ready for that. Discussed resources and knowledge provided when ready can revisit.   2. Severe debility secondary to diastolic CHF (reviewed stable currently with no recent exacerbations, edema stable), PAD s/p Bilateral BKAs,  CKD Stage IV with overall chronic disease progression family continues to hoyer to chair daily as Mr Wurm remains bedbound. He is unable to turn over in bed without assistance. Also Mr. Knupp has a sacral wound which HH has been helping care for. We talked at length about HH, how long they have been working with Mr. Seyfried in addition how  much longer [facility] will stay in. We talked about appetite, nutrition, weight loss. Continue to encourage oob daily if able.   4.  Goals of Care: Goals include to maximize quality of life and symptom management. Our advance care planning conversation included a discussion about:    The value and importance of advance care planning  Exploration of personal, cultural or spiritual beliefs that might influence medical decisions  Exploration of goals of care in the event of a sudden injury or illness  Identification and preparation of a healthcare agent  Review and updating or creation of an advance directive document.  6. Palliative care encounter; Palliative care encounter; Palliative medicine team will continue to support patient, patient's family, and medical team. Visit consisted of counseling and education dealing with the complex and emotionally intense issues of symptom management and palliative care in the setting of serious  Follow up Palliative Care Visit: Palliative care will continue to follow for complex medical decision making, advance care planning, and clarification of goals. Return 6 weeks or prn.  I spent 62 minutes providing this consultation. More than 50% of the time in this consultation was spent in counseling and care coordination.  PPS: 30%  Chief Complaint: Follow up palliative consult for complex medical decision maing  HISTORY OF PRESENT ILLNESS:  FABRICIO ENDSLEY is a 87 y.o. year old male  with multiple medical problems including CKD 4, chronic A-fib,  Moderate to severe AS noted on Echo in 06/7589, Chronic diastolic CHF (Echo showed LVEF of 63-84%, grade 2 diastolic dysfunction, and elevated left atrial pressures. He received a 3 doses of IV Lasix 21m during admission. Net negative 1.390 L. Discharge weight 94 kg), hypothyroidism, chronic diastolic heart failure (EF 65-70% on 06/19/2019), BPH, Type 2 diabetes, HTN,, HTN, PAD s/p bilateral BKA. I called Mr. RSundeto confirm f/u pc  visit and covid screening which is negative. [name] in agreement. I visited Mr. RMountswho was sitting in his w/c in the living [facility] We talked about the last time Mr REdmanwas independent. We talked about past medical history as Mr. RVignolais new to this provider. We talked about disease progression, hospitalizations, family hx with dynamics. We talked about Mr. RBirmanfunctional abilities, as he does remain bed bound, total ADL dependence with ongoing total care for bathing, dressing. Mr. RNeweydoes have an in dwelling foley. We talked about bowels, bowel pattern, regimen and intermit incontinence. We talked about appetite, weight loss, nutrition. We talked about debility in the setting of chronic disease and aging. We talked about medical goals of care. We talked about sacral wound which HH has been helping care for. We talked at length about HH, how long they have been working with Mr. RHudspethin addition [facility] We talked about appetite, nutrition, weight loss. Continue to encourage oob daily if able. Discussed with son, REdd Arbourabout option of Hospice benefit under Medicare program. We talked about overall debility, ongoing decline, recurrent UTI's, non-healing wound, weight loss of >50lbs. We talked about what services would be provided and how program works. We talked about Hospice philosophy works. REdd Arbourendorses he does not want Hospice, not ready for that. Discussed resources and knowledge provided when ready can revisit. We talked about role pc in poc. We talked about f/u pc visit, [name] in agreement, scheduled. Therapeutic listening, emotional support provided. Questions answered.   History obtained from review of EMR, discussion with son,  Mr. RBudney  I reviewed available labs, medications, imaging, studies and related documents from the EMR.  Records reviewed and summarized above.   ROS  Full 14 system review of systems performed and negative with exception of: as per  HPI.   Physical Exam: Constitutional: NAD General: frail appearing, thin, debilitated, chronically ill male EYES: lids intact, ENMT: oral mucous membranes moist CV: S1S2, RRR, no LE edema Pulmonary: LCTA, no increased work of breathing, no cough, room air Abdomen: normo-active BS + 4 quadrants, soft and non tender, MSK: hoyer lift, bedbound though able to sit in w/c; muscle wasting Skin: warm and dry Neuro:  +generalized weakness,  +mild cognitive impairment Psych: non-anxious affect, A and Oriented to self, place  Questions and concerns were addressed. The patient/family was encouraged to call with questions and/or concerns. My business card was provided. Provided general support and encouragement, no other unmet needs identified   Thank you for the opportunity to participate in the care of Mr. RBartoli  The palliative care team will continue to follow. Please call our office at [phone] we can be of additional assistance.   This chart was dictated using voice recognition software.  Despite best efforts to proofread,  errors can occur which can change the documentation meaning.   Shahrukh Pasch Z Selita Staiger, NP   COVID-19 PATIENT SCREENING TOOL Asked and negative response unless otherwise noted:   Have you had symptoms of covid, tested positive or been  in contact with someone with symptoms/positive test in the past 5-10 days?  NO

## 2020-10-10 DIAGNOSIS — R3914 Feeling of incomplete bladder emptying: Secondary | ICD-10-CM | POA: Diagnosis not present

## 2020-10-11 ENCOUNTER — Inpatient Hospital Stay (HOSPITAL_COMMUNITY)
Admission: EM | Admit: 2020-10-11 | Discharge: 2020-10-15 | DRG: 698 | Disposition: A | Discharge status: Home Health | Payer: Medicare Other | Attending: Internal Medicine | Admitting: Internal Medicine

## 2020-10-11 ENCOUNTER — Emergency Department (HOSPITAL_COMMUNITY): Payer: Medicare Other

## 2020-10-11 ENCOUNTER — Encounter (HOSPITAL_COMMUNITY): Payer: Self-pay | Admitting: Emergency Medicine

## 2020-10-11 ENCOUNTER — Other Ambulatory Visit (HOSPITAL_COMMUNITY): Payer: Medicare Other

## 2020-10-11 ENCOUNTER — Other Ambulatory Visit: Payer: Self-pay

## 2020-10-11 DIAGNOSIS — I482 Chronic atrial fibrillation, unspecified: Secondary | ICD-10-CM | POA: Diagnosis not present

## 2020-10-11 DIAGNOSIS — Z7984 Long term (current) use of oral hypoglycemic drugs: Secondary | ICD-10-CM | POA: Diagnosis not present

## 2020-10-11 DIAGNOSIS — I480 Paroxysmal atrial fibrillation: Secondary | ICD-10-CM | POA: Diagnosis present

## 2020-10-11 DIAGNOSIS — N401 Enlarged prostate with lower urinary tract symptoms: Secondary | ICD-10-CM | POA: Diagnosis present

## 2020-10-11 DIAGNOSIS — T50996A Underdosing of other drugs, medicaments and biological substances, initial encounter: Secondary | ICD-10-CM | POA: Diagnosis not present

## 2020-10-11 DIAGNOSIS — R8290 Unspecified abnormal findings in urine: Secondary | ICD-10-CM | POA: Diagnosis present

## 2020-10-11 DIAGNOSIS — G4730 Sleep apnea, unspecified: Secondary | ICD-10-CM | POA: Diagnosis not present

## 2020-10-11 DIAGNOSIS — N184 Chronic kidney disease, stage 4 (severe): Secondary | ICD-10-CM | POA: Diagnosis not present

## 2020-10-11 DIAGNOSIS — N390 Urinary tract infection, site not specified: Secondary | ICD-10-CM | POA: Diagnosis not present

## 2020-10-11 DIAGNOSIS — I130 Hypertensive heart and chronic kidney disease with heart failure and stage 1 through stage 4 chronic kidney disease, or unspecified chronic kidney disease: Secondary | ICD-10-CM | POA: Diagnosis present

## 2020-10-11 DIAGNOSIS — Y846 Urinary catheterization as the cause of abnormal reaction of the patient, or of later complication, without mention of misadventure at the time of the procedure: Secondary | ICD-10-CM | POA: Diagnosis present

## 2020-10-11 DIAGNOSIS — E7800 Pure hypercholesterolemia, unspecified: Secondary | ICD-10-CM

## 2020-10-11 DIAGNOSIS — Z91041 Radiographic dye allergy status: Secondary | ICD-10-CM

## 2020-10-11 DIAGNOSIS — I10 Essential (primary) hypertension: Secondary | ICD-10-CM | POA: Diagnosis not present

## 2020-10-11 DIAGNOSIS — U071 COVID-19: Secondary | ICD-10-CM | POA: Diagnosis not present

## 2020-10-11 DIAGNOSIS — Z79899 Other long term (current) drug therapy: Secondary | ICD-10-CM

## 2020-10-11 DIAGNOSIS — I5032 Chronic diastolic (congestive) heart failure: Secondary | ICD-10-CM | POA: Diagnosis present

## 2020-10-11 DIAGNOSIS — Z79891 Long term (current) use of opiate analgesic: Secondary | ICD-10-CM | POA: Diagnosis not present

## 2020-10-11 DIAGNOSIS — Z66 Do not resuscitate: Secondary | ICD-10-CM | POA: Diagnosis not present

## 2020-10-11 DIAGNOSIS — E1151 Type 2 diabetes mellitus with diabetic peripheral angiopathy without gangrene: Secondary | ICD-10-CM | POA: Diagnosis not present

## 2020-10-11 DIAGNOSIS — Z91A28 Caregiver's intentional underdosing of medication regimen for other reason: Secondary | ICD-10-CM

## 2020-10-11 DIAGNOSIS — Z20822 Contact with and (suspected) exposure to covid-19: Secondary | ICD-10-CM | POA: Diagnosis present

## 2020-10-11 DIAGNOSIS — E1122 Type 2 diabetes mellitus with diabetic chronic kidney disease: Secondary | ICD-10-CM | POA: Diagnosis present

## 2020-10-11 DIAGNOSIS — J90 Pleural effusion, not elsewhere classified: Secondary | ICD-10-CM | POA: Diagnosis not present

## 2020-10-11 DIAGNOSIS — Z23 Encounter for immunization: Secondary | ICD-10-CM | POA: Diagnosis not present

## 2020-10-11 DIAGNOSIS — Z87440 Personal history of urinary (tract) infections: Secondary | ICD-10-CM | POA: Diagnosis not present

## 2020-10-11 DIAGNOSIS — G9341 Metabolic encephalopathy: Secondary | ICD-10-CM | POA: Diagnosis present

## 2020-10-11 DIAGNOSIS — I252 Old myocardial infarction: Secondary | ICD-10-CM

## 2020-10-11 DIAGNOSIS — E1165 Type 2 diabetes mellitus with hyperglycemia: Secondary | ICD-10-CM | POA: Diagnosis not present

## 2020-10-11 DIAGNOSIS — Z89512 Acquired absence of left leg below knee: Secondary | ICD-10-CM

## 2020-10-11 DIAGNOSIS — L89312 Pressure ulcer of right buttock, stage 2: Secondary | ICD-10-CM | POA: Diagnosis not present

## 2020-10-11 DIAGNOSIS — I517 Cardiomegaly: Secondary | ICD-10-CM | POA: Diagnosis not present

## 2020-10-11 DIAGNOSIS — I25118 Atherosclerotic heart disease of native coronary artery with other forms of angina pectoris: Secondary | ICD-10-CM | POA: Diagnosis present

## 2020-10-11 DIAGNOSIS — T83518A Infection and inflammatory reaction due to other urinary catheter, initial encounter: Secondary | ICD-10-CM | POA: Diagnosis not present

## 2020-10-11 DIAGNOSIS — I352 Nonrheumatic aortic (valve) stenosis with insufficiency: Secondary | ICD-10-CM | POA: Diagnosis present

## 2020-10-11 DIAGNOSIS — F0390 Unspecified dementia without behavioral disturbance: Secondary | ICD-10-CM | POA: Diagnosis present

## 2020-10-11 DIAGNOSIS — I499 Cardiac arrhythmia, unspecified: Secondary | ICD-10-CM | POA: Diagnosis not present

## 2020-10-11 DIAGNOSIS — E1140 Type 2 diabetes mellitus with diabetic neuropathy, unspecified: Secondary | ICD-10-CM | POA: Diagnosis not present

## 2020-10-11 DIAGNOSIS — I5033 Acute on chronic diastolic (congestive) heart failure: Secondary | ICD-10-CM | POA: Diagnosis not present

## 2020-10-11 DIAGNOSIS — E1159 Type 2 diabetes mellitus with other circulatory complications: Secondary | ICD-10-CM | POA: Diagnosis not present

## 2020-10-11 DIAGNOSIS — E039 Hypothyroidism, unspecified: Secondary | ICD-10-CM | POA: Diagnosis present

## 2020-10-11 DIAGNOSIS — I739 Peripheral vascular disease, unspecified: Secondary | ICD-10-CM | POA: Diagnosis present

## 2020-10-11 DIAGNOSIS — E785 Hyperlipidemia, unspecified: Secondary | ICD-10-CM | POA: Diagnosis present

## 2020-10-11 DIAGNOSIS — R4182 Altered mental status, unspecified: Secondary | ICD-10-CM | POA: Diagnosis not present

## 2020-10-11 DIAGNOSIS — Z89511 Acquired absence of right leg below knee: Secondary | ICD-10-CM | POA: Diagnosis not present

## 2020-10-11 DIAGNOSIS — I4510 Unspecified right bundle-branch block: Secondary | ICD-10-CM | POA: Diagnosis not present

## 2020-10-11 DIAGNOSIS — R6889 Other general symptoms and signs: Secondary | ICD-10-CM | POA: Diagnosis not present

## 2020-10-11 DIAGNOSIS — R001 Bradycardia, unspecified: Secondary | ICD-10-CM | POA: Diagnosis present

## 2020-10-11 DIAGNOSIS — I5030 Unspecified diastolic (congestive) heart failure: Secondary | ICD-10-CM | POA: Diagnosis not present

## 2020-10-11 DIAGNOSIS — Z7901 Long term (current) use of anticoagulants: Secondary | ICD-10-CM

## 2020-10-11 DIAGNOSIS — Z823 Family history of stroke: Secondary | ICD-10-CM

## 2020-10-11 DIAGNOSIS — Z743 Need for continuous supervision: Secondary | ICD-10-CM | POA: Diagnosis not present

## 2020-10-11 DIAGNOSIS — I248 Other forms of acute ischemic heart disease: Secondary | ICD-10-CM | POA: Diagnosis present

## 2020-10-11 DIAGNOSIS — I350 Nonrheumatic aortic (valve) stenosis: Secondary | ICD-10-CM

## 2020-10-11 DIAGNOSIS — R404 Transient alteration of awareness: Secondary | ICD-10-CM | POA: Diagnosis not present

## 2020-10-11 DIAGNOSIS — Z91048 Other nonmedicinal substance allergy status: Secondary | ICD-10-CM

## 2020-10-11 DIAGNOSIS — B961 Klebsiella pneumoniae [K. pneumoniae] as the cause of diseases classified elsewhere: Secondary | ICD-10-CM | POA: Diagnosis present

## 2020-10-11 DIAGNOSIS — R338 Other retention of urine: Secondary | ICD-10-CM | POA: Diagnosis not present

## 2020-10-11 DIAGNOSIS — L89322 Pressure ulcer of left buttock, stage 2: Secondary | ICD-10-CM | POA: Diagnosis not present

## 2020-10-11 DIAGNOSIS — Z79890 Hormone replacement therapy (postmenopausal): Secondary | ICD-10-CM

## 2020-10-11 DIAGNOSIS — Z87891 Personal history of nicotine dependence: Secondary | ICD-10-CM

## 2020-10-11 DIAGNOSIS — H409 Unspecified glaucoma: Secondary | ICD-10-CM | POA: Diagnosis not present

## 2020-10-11 DIAGNOSIS — Z7902 Long term (current) use of antithrombotics/antiplatelets: Secondary | ICD-10-CM | POA: Diagnosis not present

## 2020-10-11 DIAGNOSIS — Z466 Encounter for fitting and adjustment of urinary device: Secondary | ICD-10-CM | POA: Diagnosis not present

## 2020-10-11 DIAGNOSIS — R079 Chest pain, unspecified: Secondary | ICD-10-CM | POA: Diagnosis not present

## 2020-10-11 DIAGNOSIS — Z8249 Family history of ischemic heart disease and other diseases of the circulatory system: Secondary | ICD-10-CM

## 2020-10-11 LAB — LACTIC ACID, PLASMA
Lactic Acid, Venous: 1.2 mmol/L (ref 0.5–1.9)
Lactic Acid, Venous: 1 mmol/L (ref 0.5–1.9)

## 2020-10-11 LAB — URINALYSIS, ROUTINE W REFLEX MICROSCOPIC
Bacteria, UA: NONE SEEN
Bilirubin Urine: NEGATIVE
Glucose, UA: NEGATIVE mg/dL
Ketones, ur: NEGATIVE mg/dL
Nitrite: NEGATIVE
Protein, ur: 100 mg/dL — AB
Specific Gravity, Urine: 1.013 (ref 1.005–1.030)
WBC, UA: >50 WBC/hpf — ABNORMAL HIGH (ref 0–5)
pH: 5 (ref 5.0–8.0)

## 2020-10-11 LAB — RESP PANEL BY RT-PCR (FLU A&B, COVID) ARPGX2
Influenza A by PCR: NEGATIVE
Influenza B by PCR: NEGATIVE
SARS Coronavirus 2 by RT PCR: NEGATIVE

## 2020-10-11 LAB — COMPREHENSIVE METABOLIC PANEL
ALT: 16 U/L (ref 0–44)
AST: 20 U/L (ref 15–41)
Albumin: 3.5 g/dL (ref 3.5–5.0)
Alkaline Phosphatase: 45 U/L (ref 38–126)
Anion gap: 11 (ref 5–15)
BUN: 45 mg/dL — ABNORMAL HIGH (ref 8–23)
CO2: 23 mmol/L (ref 22–32)
Calcium: 8.8 mg/dL — ABNORMAL LOW (ref 8.9–10.3)
Chloride: 101 mmol/L (ref 98–111)
Creatinine, Ser: 3.31 mg/dL — ABNORMAL HIGH (ref 0.61–1.24)
GFR, Estimated: 17 mL/min — ABNORMAL LOW (ref >60)
Glucose, Bld: 138 mg/dL — ABNORMAL HIGH (ref 70–99)
Potassium: 3.7 mmol/L (ref 3.5–5.1)
Sodium: 135 mmol/L (ref 135–145)
Total Bilirubin: 0.6 mg/dL (ref 0.3–1.2)
Total Protein: 7.7 g/dL (ref 6.5–8.1)

## 2020-10-11 LAB — CBC WITH DIFFERENTIAL/PLATELET
Abs Immature Granulocytes: 0.04 10*3/uL (ref 0.00–0.07)
Basophils Absolute: 0 10*3/uL (ref 0.0–0.1)
Basophils Relative: 0 %
Eosinophils Absolute: 0.4 10*3/uL (ref 0.0–0.5)
Eosinophils Relative: 2 %
HCT: 34.9 % — ABNORMAL LOW (ref 39.0–52.0)
Hemoglobin: 11.7 g/dL — ABNORMAL LOW (ref 13.0–17.0)
Immature Granulocytes: 0 %
Lymphocytes Relative: 13 %
Lymphs Abs: 2.1 10*3/uL (ref 0.7–4.0)
MCH: 33.4 pg (ref 26.0–34.0)
MCHC: 33.5 g/dL (ref 30.0–36.0)
MCV: 99.7 fL (ref 80.0–100.0)
Monocytes Absolute: 1 10*3/uL (ref 0.1–1.0)
Monocytes Relative: 6 %
Neutro Abs: 12.5 10*3/uL — ABNORMAL HIGH (ref 1.7–7.7)
Neutrophils Relative %: 79 %
Platelets: 183 10*3/uL (ref 150–400)
RBC: 3.5 MIL/uL — ABNORMAL LOW (ref 4.22–5.81)
RDW: 14.7 % (ref 11.5–15.5)
WBC: 16 10*3/uL — ABNORMAL HIGH (ref 4.0–10.5)
nRBC: 0 % (ref 0.0–0.2)

## 2020-10-11 LAB — TROPONIN I (HIGH SENSITIVITY)
Troponin I (High Sensitivity): 76 ng/L — ABNORMAL HIGH (ref <18)
Troponin I (High Sensitivity): 93 ng/L — ABNORMAL HIGH (ref <18)
Troponin I (High Sensitivity): 120 ng/L (ref <18)
Troponin I (High Sensitivity): 109 ng/L (ref <18)

## 2020-10-11 LAB — LIPASE, BLOOD: Lipase: 26 U/L (ref 11–51)

## 2020-10-11 LAB — TSH: TSH: 5.272 u[IU]/mL — ABNORMAL HIGH (ref 0.350–4.500)

## 2020-10-11 MED ORDER — NITROGLYCERIN 0.4 MG SL SUBL
0.4000 mg | SUBLINGUAL_TABLET | SUBLINGUAL | Status: DC | PRN
  Administered 2020-10-12: 0.4 mg via SUBLINGUAL
  Filled 2020-10-11: qty 1

## 2020-10-11 MED ORDER — CLOPIDOGREL BISULFATE 75 MG PO TABS
75.0000 mg | ORAL_TABLET | Freq: Every day | ORAL | Status: DC
  Administered 2020-10-11 – 2020-10-15 (×5): 75 mg via ORAL
  Filled 2020-10-11 (×5): qty 1

## 2020-10-11 MED ORDER — SODIUM CHLORIDE 0.9 % IV BOLUS
1000.0000 mL | Freq: Once | INTRAVENOUS | Status: AC
  Administered 2020-10-11: 1000 mL via INTRAVENOUS

## 2020-10-11 MED ORDER — DOXAZOSIN MESYLATE 2 MG PO TABS
2.0000 mg | ORAL_TABLET | Freq: Every day | ORAL | Status: DC
  Administered 2020-10-11 – 2020-10-15 (×5): 2 mg via ORAL
  Filled 2020-10-11 (×5): qty 1

## 2020-10-11 MED ORDER — ATORVASTATIN CALCIUM 40 MG PO TABS
40.0000 mg | ORAL_TABLET | Freq: Every day | ORAL | Status: DC
  Administered 2020-10-11 – 2020-10-15 (×5): 40 mg via ORAL
  Filled 2020-10-11 (×5): qty 1

## 2020-10-11 MED ORDER — ACETAMINOPHEN 325 MG PO TABS
650.0000 mg | ORAL_TABLET | Freq: Four times a day (QID) | ORAL | Status: DC | PRN
  Administered 2020-10-12 – 2020-10-13 (×3): 650 mg via ORAL
  Filled 2020-10-11 (×4): qty 2

## 2020-10-11 MED ORDER — INFLUENZA VAC A&B SA ADJ QUAD 0.5 ML IM PRSY
0.5000 mL | PREFILLED_SYRINGE | INTRAMUSCULAR | Status: AC
  Administered 2020-10-12: 0.5 mL via INTRAMUSCULAR
  Filled 2020-10-11: qty 0.5

## 2020-10-11 MED ORDER — HEPARIN SODIUM (PORCINE) 5000 UNIT/ML IJ SOLN
5000.0000 [IU] | Freq: Three times a day (TID) | INTRAMUSCULAR | Status: DC
  Administered 2020-10-11 – 2020-10-13 (×6): 5000 [IU] via SUBCUTANEOUS
  Filled 2020-10-11 (×5): qty 1

## 2020-10-11 MED ORDER — PIPERACILLIN-TAZOBACTAM 3.375 G IVPB 30 MIN
3.3750 g | Freq: Once | INTRAVENOUS | Status: AC
  Administered 2020-10-11: 3.375 g via INTRAVENOUS
  Filled 2020-10-11: qty 50

## 2020-10-11 MED ORDER — ACETAMINOPHEN 650 MG RE SUPP: 650.0000 mg | Freq: Four times a day (QID) | RECTAL | Status: DC | PRN

## 2020-10-11 MED ORDER — AMLODIPINE BESYLATE 5 MG PO TABS
5.0000 mg | ORAL_TABLET | Freq: Every day | ORAL | Status: DC
  Administered 2020-10-11 – 2020-10-15 (×5): 5 mg via ORAL
  Filled 2020-10-11 (×5): qty 1

## 2020-10-11 MED ORDER — ASPIRIN 300 MG RE SUPP
300.0000 mg | Freq: Every day | RECTAL | Status: DC
  Administered 2020-10-11 – 2020-10-12 (×2): 300 mg via RECTAL
  Filled 2020-10-11: qty 1

## 2020-10-11 MED ORDER — LEVOTHYROXINE SODIUM 75 MCG PO TABS
75.0000 ug | ORAL_TABLET | Freq: Every day | ORAL | Status: DC
  Administered 2020-10-12 – 2020-10-15 (×4): 75 ug via ORAL
  Filled 2020-10-11 (×4): qty 1

## 2020-10-11 MED ORDER — SODIUM CHLORIDE 0.9 % IV SOLN: INTRAVENOUS | Status: DC

## 2020-10-11 MED ORDER — PIPERACILLIN-TAZOBACTAM 3.375 G IVPB 30 MIN
2.2500 g | Freq: Three times a day (TID) | INTRAVENOUS | Status: DC
  Administered 2020-10-11 – 2020-10-12 (×2): 2.25 g via INTRAVENOUS
  Filled 2020-10-11 (×5): qty 50

## 2020-10-11 NOTE — ED Triage Notes (Signed)
Pt to the ED via [facility] who was called out for a LOC. Pt was responsive on arrival VSS.  Pt complained of chest pain during the night and was given nitro x 3 and tramadol with resolution.  Pt has a foley in place on arrival.

## 2020-10-11 NOTE — Progress Notes (Signed)
Date and time results received: 10/11/20 2035 (use smartphrase ".now" to insert current time)  Test: trop Critical Value: 120  Name of Provider Notified: Dr. Carlynn Purl  Orders Received? Or Actions Taken?: Actions Taken: MD notified via page.

## 2020-10-11 NOTE — ED Provider Notes (Signed)
[facility] EMERGENCY DEPARTMENT Provider Note   CSN: [id number] Arrival date & time: 10/11/20  1032     History Chief Complaint  Patient presents with   Altered Mental Status    [name] is a 87 y.o. male.  Level 5 caveat secondary to altered mental status.  Patient arrives by EMS who is providing history.  They were called for unresponsiveness.  They said that the patient had complained of chest pain during the night and received 3 nitro and tramadol with improvement.  He also had his Foley changed yesterday.  Patient able to provide any history.  The history is provided by the EMS personnel.  Altered Mental Status Presenting symptoms: unresponsiveness   Severity:  Unable to specify Most recent episode:  Today Episode history:  Continuous Timing:  Constant Progression:  Unchanged Chronicity:  New     Past Medical History:  Diagnosis Date   Arthritis    Borderline diabetic    CHF (congestive heart failure) (HCC)    Chronic kidney disease    Diabetes mellitus without complication (HCC)    Dysrhythmia    Glaucoma    Hyperlipidemia    Hypertension    Hypothyroidism    Myocardial infarction [facility])    PVD (peripheral vascular disease) ([place])    Sleep apnea    Cpap ordered but doesnt use   Urinary retention due to benign prostatic hyperplasia 12/28/2018    Patient Active Problem List   Diagnosis Date Noted   COVID-19 virus infection 08/09/2020   Demand ischemia ([place]) 06/18/2020   Uncontrolled type 2 diabetes mellitus with hyperglycemia, without long-term current use of insulin ([place]) 02/25/2020   CKD (chronic kidney disease) stage 4, GFR 15-29 ml/min ([place]) 02/25/2020   Acute UTI    AMS (altered mental status) 02/24/2020   PNA (pneumonia) 12/06/2019   Sepsis due to PNA 96/22/2979   Acute metabolic encephalopathy 89/21/1941   AKI (acute kidney injury) [facility])    Aortic valve stenosis    Palliative care encounter    Acute kidney injury superimposed on CKD ([place])  10/04/2019   Hypokalemia 10/04/2019   Weakness generalized 04/05/2019   S/P BKA (below knee amputation) unilateral, left ([place]) 02/15/2019   Pressure injury of skin 02/03/2019   TIA (transient ischemic attack) 02/03/2019   Transient speech disturbance 02/02/2019   Chronic indwelling Foley catheter 01/22/2019   Postoperative wound infection 01/20/2019   Normocytic anemia 01/18/2019   Cellulitis 01/18/2019   Infection of deep incisional surgical site after procedure    Abnormal urinalysis    Labile blood glucose    Urinary retention    S/P bilateral BKA (below knee amputation) ([place])    Acute blood loss anemia    Urinary retention due to benign prostatic hyperplasia 12/28/2018   Unilateral complete BKA, left, initial encounter ([place]) 12/25/2018   Dehiscence of amputation stump ([place])    History of transmetatarsal amputation of left foot ([place]) 12/09/2018   Critical limb ischemia with history of revascularization of same extremity ([place])    Gangrene of left foot ([place])    Diabetic ulcer of toe of left foot associated with type 2 diabetes mellitus ([place])    Cellulitis of left lower extremity    Toe infection 11/18/2018   Peripheral edema    Acute on chronic diastolic CHF (congestive heart failure) ([place]) 11/09/2018   S/P BKA (below knee amputation) unilateral, right ([place]) 10/05/2018   Hyponatremia    Essential hypertension    Postoperative pain    Pain  of left heel    Unable to maintain weight-bearing    Type 2 diabetes mellitus with diabetic peripheral angiopathy with gangrene ([place])    Anemia due to acute blood loss    Chronic kidney disease (CKD), stage IV (severe) (HCC)    Acquired absence of right leg below knee ([place]) 09/07/2018   Gangrene of right foot (HCC)    Goals of care, counseling/discussion    Palliative care by specialist    Type 2 diabetes mellitus with vascular disease ([place])    Severe protein-calorie malnutrition ([place])    Ischemic foot 08/20/2018   Critical lower limb  ischemia ([place]) 08/03/2018   Type 2 diabetes mellitus with foot ulcer, without long-term current use of insulin (HCC)    Gastroesophageal reflux disease    Cellulitis of great toe of right foot 05/29/2018   Atrial fibrillation, chronic    Chest pain 07/25/2017   PAF (paroxysmal atrial fibrillation) ([place]) 07/25/2017   BPH (benign prostatic hyperplasia) 07/25/2017   Shortness of breath 05/11/2017   Hypothyroidism 05/11/2017   Chronic diastolic CHF (congestive heart failure) ([place]) 05/11/2017   Moderate aortic stenosis 06/12/2016   RBBB 06/12/2016   Peripheral vascular disease ([place]) 08/04/2012   Essential hypertension, benign 08/04/2012   Hyperlipidemia 08/04/2012   Dizziness 08/04/2012    Past Surgical History:  Procedure Laterality Date   ABDOMINAL AORTOGRAM W/LOWER EXTREMITY N/A 07/27/2018   Procedure: ABDOMINAL AORTOGRAM W/LOWER EXTREMITY;  Surgeon: Lorretta Harp, MD;  Location: [place] [facility];  Service: Cardiovascular;  Laterality: N/A;   AMPUTATION Right 08/28/2018   Procedure: RIGHT BELOW KNEE AMPUTATION;  Surgeon: [name], MD;  Location: [place];  Service: Orthopedics;  Laterality: Right;   AMPUTATION Left 12/02/2018   Procedure: LEFT TRANSMETATARSAL AMPUTATION AND NEGATIVE PRESSURE WOUND VAC PLACEMENT;  Surgeon: [name], MD;  Location: [place];  Service: Orthopedics;  Laterality: Left;   AMPUTATION Left 12/18/2018   Procedure: LEFT BELOW KNEE AMPUTATION;  Surgeon: [name], MD;  Location: [place];  Service: Orthopedics;  Laterality: Left;   APPENDECTOMY  1943   BELOW KNEE LEG AMPUTATION Left 12/18/2018   CATARACT EXTRACTION  2012   x2   COLONOSCOPY N/A 11/01/2014   Procedure: COLONOSCOPY;  Surgeon: Aviva Signs, MD;  [facility];  Service: Gastroenterology;  Laterality: N/A;   ESOPHAGOGASTRODUODENOSCOPY N/A 11/01/2014   Procedure: ESOPHAGOGASTRODUODENOSCOPY (EGD);  Surgeon: Aviva Signs, MD;  [facility];  Service: Gastroenterology;   Laterality: N/A;   HERNIA REPAIR Left    INGUINAL HERNIA REPAIR Right 03/01/2013   Procedure: RIGHT INGUINAL HERNIORRHAPHY;  Surgeon: Jamesetta So, MD;  Location: [facility];  Service: General;  Laterality: Right;   INSERTION OF MESH Right 03/01/2013   Procedure: INSERTION OF MESH;  Surgeon: Jamesetta So, MD;  Location: [facility];  Service: General;  Laterality: Right;   LOWER EXTREMITY ANGIOGRAPHY Right 08/25/2018   Procedure: LOWER EXTREMITY ANGIOGRAPHY;  Surgeon: Wellington Hampshire, MD;  Location: [place] [facility];  Service: Cardiovascular;  Laterality: Right;   LOWER EXTREMITY ANGIOGRAPHY N/A 11/25/2018   Procedure: LOWER EXTREMITY ANGIOGRAPHY;  Surgeon: Wellington Hampshire, MD;  Location: [place] [facility];  Service: Cardiovascular;  Laterality: N/A;   NM MYOCAR PERF WALL MOTION  06/05/2010   no significant ischemia   PERIPHERAL VASCULAR ATHERECTOMY Right 08/03/2018   Procedure: PERIPHERAL VASCULAR ATHERECTOMY;  Surgeon: Lorretta Harp, MD;  Location: [place] [facility];  Service: Cardiovascular;  Laterality: Right;   PERIPHERAL  VASCULAR ATHERECTOMY Left 11/23/2018   Procedure: PERIPHERAL VASCULAR ATHERECTOMY;  Surgeon: Lorretta Harp, MD;  Location: [place] [facility];  Service: Cardiovascular;  Laterality: Left;  sfa with dc balloon   PERIPHERAL VASCULAR BALLOON ANGIOPLASTY Left 11/25/2018   Procedure: PERIPHERAL VASCULAR BALLOON ANGIOPLASTY;  Surgeon: Wellington Hampshire, MD;  Location: [place] [facility];  Service: Cardiovascular;  Laterality: Left;  popliteal   PERIPHERAL VASCULAR INTERVENTION Left 11/25/2018   Procedure: PERIPHERAL VASCULAR INTERVENTION;  Surgeon: Wellington Hampshire, MD;  Location: [place] [facility];  Service: Cardiovascular;  Laterality: Left;  tibial peroneal trunk and peroneal stents    stent  06/14/2010   left leg   STUMP REVISION Left 01/20/2019   Procedure: REVISION LEFT BELOW KNEE AMPUTATION;  Surgeon: [name], MD;  Location: [place];  Service: Orthopedics;   Laterality: Left;   US ECHOCARDIOGRAPHY  01/21/2006   moderate mitral annular ca+, mild MR, AOV moderately sclerotic.       Family History  Problem Relation Age of Onset   Cancer Mother    Heart attack Father    Stroke Father    Heart attack Brother    Heart attack Brother     Social History   Tobacco Use   Smoking status: Former   Smokeless tobacco: Former    Types: Nurse, children's Use: Never used  Substance Use Topics   Alcohol use: No   Drug use: No    Home Medications Prior to Admission medications   Medication Sig Start Date End Date Taking? Authorizing Provider  acetaminophen (TYLENOL) 500 MG tablet Take 500 mg by mouth every 6 (six) hours as needed for headache (pain).    [provider]  amLODipine (NORVASC) 5 MG tablet Take 5 mg by mouth daily. 07/24/20   [provider]  apixaban (ELIQUIS) 2.5 MG TABS tablet TAKE 1 TABLET(2.5 MG) BY MOUTH TWICE DAILY 10/05/20   Almyra Deforest, PA  Ascorbic Acid (VITAMIN C) 1000 MG tablet Take 1,000 mg by mouth daily.     [provider]  atorvastatin (LIPITOR) 40 MG tablet TAKE 1 TABLET(40 MG) BY MOUTH EVERY EVENING 07/19/20   Hilty, Nadean Corwin, MD  betamethasone dipropionate 0.05 % cream Apply 1 application topically 2 (two) times daily. 12/03/19   [provider]  carvedilol (COREG) 3.125 MG tablet TAKE 1 TABLET(3.125 MG) BY MOUTH TWICE DAILY WITH A MEAL 09/15/20   Hilty, Nadean Corwin, MD  Cholecalciferol (VITAMIN D-3) 125 MCG (5000 UT) TABS Take 5,000 Units by mouth every evening.    [provider]  clopidogrel (PLAVIX) 75 MG tablet TAKE 1 TABLET(75 MG) BY MOUTH DAILY WITH BREAKFAST 08/28/20   Hilty, Nadean Corwin, MD  co-enzyme Q-10 30 MG capsule Take 100 mg by mouth daily.    [provider]  doxazosin (CARDURA) 2 MG tablet TAKE 1 TABLET(2 MG) BY MOUTH DAILY 09/15/20   Hilty, Nadean Corwin, MD  famotidine (PEPCID) 20 MG tablet Take 1 tablet (20 mg total) by mouth daily. 11/12/18    Johnson, Clanford L, MD  ferrous sulfate 325 (65 FE) MG tablet Take 325 mg by mouth 2 (two) times daily with a meal.    [provider]  furosemide (LASIX) 40 MG tablet Take 2 tablets (80 mg total) by mouth daily. Take 2 tablets (80mg ) once daily in the morning. Can take an additional 1 tablet (40mg ) in the afternoon as needed for weight gain (3lbs in 1 day or 5lbs  in 1 week). 06/19/20 09/17/20  Barrett, Evelene Croon, PA-C  glipiZIDE (GLUCOTROL) 10 MG tablet Take 10 mg by mouth 2 (two) times daily.    [provider]  latanoprost (XALATAN) 0.005 % ophthalmic solution Place 1 drop into both eyes at bedtime. 09/30/19   [provider]  levothyroxine (SYNTHROID) 75 MCG tablet Take 75 mcg by mouth daily before breakfast.  02/12/19   [provider]  nitrofurantoin, macrocrystal-monohydrate, (MACROBID) 100 MG capsule Take 100 mg by mouth 2 (two) times daily. 08/23/20   [provider]  nitroGLYCERIN (NITROSTAT) 0.4 MG SL tablet Place 1 tablet (0.4 mg total) under the tongue every 5 (five) minutes x 3 doses as needed for chest pain. 06/19/20   Barrett, Evelene Croon, PA-C  ONETOUCH ULTRA test strip 1 each 2 (two) times daily. 04/15/19   [provider]  polyethylene glycol (MIRALAX) 17 g packet Take 17 g by mouth daily as needed for moderate constipation. 12/09/18   Mercy Riding, MD  potassium chloride SA (KLOR-CON) 20 MEQ tablet Take 2 tablets (40 mEq total) by mouth daily. 06/20/20   Barrett, Evelene Croon, PA-C  pyridoxine (B-6) 100 MG tablet Take 100 mg by mouth daily.    [provider]  QUERCETIN PO Take 1,000 mg by mouth daily.    [provider]  traMADol (ULTRAM) 50 MG tablet Take 1 tablet (50 mg total) by mouth every 6 (six) hours as needed. 02/15/19   Lovorn, Jinny Blossom, MD  vitamin E 400 UNIT capsule Take 400 Units by mouth daily.    [provider]  zinc gluconate 50 MG tablet Take 50 mg by mouth daily.    [provider]    Allergies     Iodinated diagnostic agents, Dye fdc red [red dye], and Hydralazine  Review of Systems   Review of Systems  Unable to perform ROS: Patient unresponsive   Physical Exam Updated Vital Signs BP (!) 166/66 (BP Location: Left Arm)   Pulse 60   Temp 98.7 F (37.1 C) (Oral)   Resp 16   Ht 5\' 11"  (1.803 m)   Wt 98 kg   SpO2 94%   BMI 30.13 kg/m   Physical Exam Vitals and nursing note reviewed.  Constitutional:      Appearance: He is well-developed.  HENT:     Head: Normocephalic and atraumatic.  Eyes:     Conjunctiva/sclera: Conjunctivae normal.  Cardiovascular:     Rate and Rhythm: Normal rate and regular rhythm.     Heart sounds: Murmur heard.  Pulmonary:     Effort: Pulmonary effort is normal. No respiratory distress.     Breath sounds: Normal breath sounds.  Abdominal:     Palpations: Abdomen is soft.     Tenderness: There is no abdominal tenderness. There is no guarding or rebound.  Genitourinary:    Comments: Foley cath in place Musculoskeletal:     Cervical back: Neck supple.     Comments: Bilateral BKA  Skin:    General: Skin is warm and dry.  Neurological:     Mental Status: He is lethargic.     Comments: Patient will open eyes to stimulation.  Not answering questions.  No obvious facial asymmetry.  Spontaneous movements of upper extremities with no clear focal deficits.  Patient not following commands though    ED Results / Procedures / Treatments   Labs (all labs ordered are listed, but only abnormal results are displayed) Labs Reviewed  URINALYSIS, ROUTINE W  REFLEX MICROSCOPIC - Abnormal; Notable for the following components:      Result Value   APPearance TURBID (*)    Hgb urine dipstick SMALL (*)    Protein, ur 100 (*)    Leukocytes,Ua LARGE (*)    WBC, UA >50 (*)    Non Squamous Epithelial 0-5 (*)    All other components within normal limits  COMPREHENSIVE METABOLIC PANEL - Abnormal; Notable for the following components:   Glucose, Bld 138 (*)     BUN 45 (*)    Creatinine, Ser 3.31 (*)    Calcium 8.8 (*)    GFR, Estimated 17 (*)    All other components within normal limits  CBC WITH DIFFERENTIAL/PLATELET - Abnormal; Notable for the following components:   WBC 16.0 (*)    RBC 3.50 (*)    Hemoglobin 11.7 (*)    HCT 34.9 (*)    Neutro Abs 12.5 (*)    All other components within normal limits  TSH - Abnormal; Notable for the following components:   TSH 5.272 (*)    All other components within normal limits  TROPONIN I (HIGH SENSITIVITY) - Abnormal; Notable for the following components:   Troponin I (High Sensitivity) 76 (*)    All other components within normal limits  TROPONIN I (HIGH SENSITIVITY) - Abnormal; Notable for the following components:   Troponin I (High Sensitivity) 93 (*)    All other components within normal limits  RESP PANEL BY RT-PCR (FLU A&B, COVID) ARPGX2  CULTURE, BLOOD (ROUTINE X 2)  CULTURE, BLOOD (ROUTINE X 2)  CULTURE, BLOOD (ROUTINE X 2)  CULTURE, BLOOD (ROUTINE X 2)  URINE CULTURE  URINE CULTURE  LIPASE, BLOOD  LACTIC ACID, PLASMA  LACTIC ACID, PLASMA  CBC  CBC WITH DIFFERENTIAL/PLATELET  HEMOGLOBIN A1C  COMPREHENSIVE METABOLIC PANEL  MAGNESIUM  PHOSPHORUS    EKG None  Radiology CT Head Wo Contrast  Result Date: 10/11/2020 CLINICAL DATA:  Altered mental status EXAM: CT HEAD WITHOUT CONTRAST TECHNIQUE: Contiguous axial images were obtained from the base of the skull through the vertex without intravenous contrast. COMPARISON:  Head CT 08/09/2020 FINDINGS: Brain: There is thin pachymeningeal thickening versus chronic subdural collections overlying the bilateral cerebral hemispheres measuring up to 3 mm in the coronal plane bilaterally, unchanged (40-71). There is no mass effect on the underlying brain parenchyma. There is no evidence of acute intracranial hemorrhage, new extra-axial fluid collection, or acute infarct. There is unchanged mild global parenchymal volume loss and chronic white  matter microangiopathy. The ventricles are stable in size. There is no mass lesion. There is no midline shift. Vascular: There is calcification of the bilateral cavernous ICAs. Skull: Normal. Negative for fracture or focal lesion. Sinuses/Orbits: There is mild mucosal thickening along the floor of the left maxillary sinus. Bilateral lens implants are in place. The globes and orbits are otherwise unremarkable. Other: None. IMPRESSION: 1. No acute intracranial pathology. 2. Unchanged pachymeningeal thickening versus thin chronic subdural collections overlying the bilateral cerebral hemispheres. No mass effect on the underlying brain parenchyma or midline shift. Electronically Signed   By: Valetta Mole M.D.   On: 10/11/2020 12:21   DG Chest Port 1 View  Result Date: 10/11/2020 CLINICAL DATA:  Chest pain last night, history of CHF, COVID EXAM: PORTABLE CHEST 1 VIEW COMPARISON:  Chest radiograph 08/09/2020 FINDINGS: The heart is enlarged, grossly similar to the prior study. The mediastinal contours are prominent, also not significantly changed. There is a moderate layering left pleural effusion  with adjacent consolidative opacity in the left base. This is slightly worsened since 08/09/2020. The left upper lobe remains well aerated. There is no focal airspace disease on the right. There is no significant right pleural effusion. There is no pneumothorax. There is no acute osseous abnormality. IMPRESSION: Interval increase in size of the moderate left pleural effusion with worsened consolidation in the left base since 08/09/2020. Electronically Signed   By: Valetta Mole M.D.   On: 10/11/2020 11:37    Procedures Procedures   Medications Ordered in ED Medications  heparin injection 5,000 Units (5,000 Units Subcutaneous Given 10/11/20 1352)  0.9 %  sodium chloride infusion ( Intravenous New Bag/Given 10/11/20 1356)  acetaminophen (TYLENOL) tablet 650 mg (has no administration in time range)    Or  acetaminophen  (TYLENOL) suppository 650 mg (has no administration in time range)  aspirin suppository 300 mg (300 mg Rectal Given 10/11/20 1350)  influenza vaccine adjuvanted (FLUAD) injection 0.5 mL (has no administration in time range)  sodium chloride 0.9 % bolus 1,000 mL (0 mLs Intravenous Stopped 10/11/20 1330)  piperacillin-tazobactam (ZOSYN) IVPB 3.375 g (0 g Intravenous Stopped 10/11/20 1240)    ED Course  I have reviewed the triage vital signs and the nursing notes.  Pertinent labs & imaging results that were available during my care of the patient were reviewed by me and considered in my medical decision making (see chart for details).  Clinical Course as of 10/11/20 1753  Wed Oct 11, 2020  1110 Daughter is here now and confirms history.  Had Foley catheter changed yesterday.  Chest pain overnight and received nitro and tramadol.  Less responsive this morning. [MB]  1306 Updated daughter on work-up so far.  She is comfortable plan for admission.  Discussed with [facility] hospitalist Dr. Sherral Hammers who will evaluate patient for admission. [MB]    Clinical Course User Index [MB] Hayden Rasmussen, MD   MDM Rules/Calculators/A&P                          This patient complains of altered mental status lethargy; this involves an extensive number of treatment Options and is a complaint that carries with it a high risk of complications and Morbidity. The differential includes sepsis, Sirs, bacteremia, UTI, pneumonia, stroke, bleed, dehydration  I ordered, reviewed and interpreted labs, which included CBC with elevated white count, hemoglobin stable from priors, chemistries with chronic CKD, urinalysis with signs of infection, blood culture sent, lactate not elevated, troponins mildly elevated will need to be trended I ordered medication IV fluids IV antibiotics I ordered imaging studies which included head CT and chest x-ray and I independently    visualized and interpreted imaging which showed no acute  findings Additional history obtained from patient's daughter Previous records obtained and reviewed in epic including prior admissions I consulted Dr. Sherral Hammers [facility] hospitalist and discussed lab and imaging findings  Critical Interventions: None  After the interventions stated above, I reevaluated the patient and found patient still to be somnolent.  Will need admission to the hospital for further management.   Final Clinical Impression(s) / ED Diagnoses Final diagnoses:  Altered mental status, unspecified altered mental status type    Rx / DC Orders ED Discharge Orders     None        Hayden Rasmussen, MD 10/11/20 1757

## 2020-10-11 NOTE — H&P (Addendum)
[facility] History and Physical  [name] BSW:[id number] DOB: 1927-05-31 DOA: 10/11/2020  Referring physician:  PCP: Redmond School, MD   Chief Complaint: Altered mental status  HPI: [name] is a 87 y.o. WM PMHx DM type II uncontrolled with complication, HLD, s/p bilateral lower extremity BKA, chronic diastolic CHF, moderate to severe aortic stenosis, s/p NSTEMI, HTN, hypothyroidism, OSA (not on CPAP), urinary retention secondary to BPH indwelling Foley catheter.  CKD stage IV  Level 5 caveat secondary to altered mental status.  Patient arrives by EMS who is providing history.  They were called for unresponsiveness.  They said that the patient had complained of chest pain during the night and received 3 nitro and tramadol with improvement.  He also had his Foley changed yesterday.  Patient unable to provide any history.  Per daughter substernal chest pain with radiation to left axilla, positive nausea, positive diaphoresis.  Dr. Mali Hilty is patient's cardiologist.  Per daughter on a daily basis baseline A/O x4 with some mild forgetfulness.   Review of Systems:  Covid vaccination; vaccinated 2/3 per daughter  Constitutional:  No weight loss, night sweats, Fevers, chills, fatigue.  HEENT:  No headaches, Difficulty swallowing,Tooth/dental problems,Sore throat,  No sneezing, itching, ear ache, nasal congestion, post nasal drip,  Cardio-vascular:  No chest pain, Orthopnea, PND, swelling in lower extremities, anasarca, dizziness, palpitations  GI:  No heartburn, indigestion, abdominal pain, nausea, vomiting, diarrhea, change in bowel habits, loss of appetite  Resp:  No shortness of breath with exertion or at rest. No excess mucus, no productive cough, No non-productive cough, No coughing up of blood.No change in color of mucus.No wheezing.No chest wall deformity  Skin:  no rash or lesions.  GU:  no dysuria, change in color of urine, no urgency or frequency. No flank  pain.  Musculoskeletal:  No joint pain or swelling. No decreased range of motion. No back pain.  Psych:  No change in mood or affect. No depression or anxiety. No memory loss.   Past Medical History:  Diagnosis Date   Arthritis    Borderline diabetic    CHF (congestive heart failure) (HCC)    Chronic kidney disease    Diabetes mellitus without complication ([place])    Dysrhythmia    Glaucoma    Hyperlipidemia    Hypertension    Hypothyroidism    Myocardial infarction [facility])    PVD (peripheral vascular disease) ([place])    Sleep apnea    Cpap ordered but doesnt use   Urinary retention due to benign prostatic hyperplasia 12/28/2018   Past Surgical History:  Procedure Laterality Date   ABDOMINAL AORTOGRAM W/LOWER EXTREMITY N/A 07/27/2018   Procedure: ABDOMINAL AORTOGRAM W/LOWER EXTREMITY;  Surgeon: Lorretta Harp, MD;  Location: [place] [facility];  Service: Cardiovascular;  Laterality: N/A;   AMPUTATION Right 08/28/2018   Procedure: RIGHT BELOW KNEE AMPUTATION;  Surgeon: [name], MD;  Location: [place];  Service: Orthopedics;  Laterality: Right;   AMPUTATION Left 12/02/2018   Procedure: LEFT TRANSMETATARSAL AMPUTATION AND NEGATIVE PRESSURE WOUND VAC PLACEMENT;  Surgeon: [name], MD;  Location: [place];  Service: Orthopedics;  Laterality: Left;   AMPUTATION Left 12/18/2018   Procedure: LEFT BELOW KNEE AMPUTATION;  Surgeon: [name], MD;  Location: [place];  Service: Orthopedics;  Laterality: Left;   APPENDECTOMY  1943   BELOW KNEE LEG AMPUTATION Left 12/18/2018   CATARACT EXTRACTION  2012   x2   COLONOSCOPY N/A 11/01/2014  Procedure: COLONOSCOPY;  Surgeon: Aviva Signs, MD;  [facility];  Service: Gastroenterology;  Laterality: N/A;   ESOPHAGOGASTRODUODENOSCOPY N/A 11/01/2014   Procedure: ESOPHAGOGASTRODUODENOSCOPY (EGD);  Surgeon: Aviva Signs, MD;  [facility];  Service: Gastroenterology;  Laterality: N/A;   HERNIA REPAIR Left    INGUINAL  HERNIA REPAIR Right 03/01/2013   Procedure: RIGHT INGUINAL HERNIORRHAPHY;  Surgeon: Jamesetta So, MD;  Location[facility];  Service: General;  Laterality: Right;   INSERTION OF MESH Right 03/01/2013   Procedure: INSERTION OF MESH;  Surgeon: Jamesetta So, MD;  [facility];  Service: General;  Laterality: Right;   LOWER EXTREMITY ANGIOGRAPHY Right 08/25/2018   Procedure: LOWER EXTREMITY ANGIOGRAPHY;  Surgeon: Wellington Hampshire, MD;  Location: [place] [facility];  Service: Cardiovascular;  Laterality: Right;   LOWER EXTREMITY ANGIOGRAPHY N/A 11/25/2018   Procedure: LOWER EXTREMITY ANGIOGRAPHY;  Surgeon: Wellington Hampshire, MD;  Location: [place]  [facility];  Service: Cardiovascular;  Laterality: N/A;   NM MYOCAR PERF WALL MOTION  06/05/2010   no significant ischemia   PERIPHERAL VASCULAR ATHERECTOMY Right 08/03/2018   Procedure: PERIPHERAL VASCULAR ATHERECTOMY;  Surgeon: Lorretta Harp, MD;  Location: [place] [facility];  Service: Cardiovascular;  Laterality: Right;   PERIPHERAL VASCULAR ATHERECTOMY Left 11/23/2018   Procedure: PERIPHERAL VASCULAR ATHERECTOMY;  Surgeon: Lorretta Harp, MD;  Location: [place] [facility];  Service: Cardiovascular;  Laterality: Left;  sfa with dc balloon   PERIPHERAL VASCULAR BALLOON ANGIOPLASTY Left 11/25/2018   Procedure: PERIPHERAL VASCULAR BALLOON ANGIOPLASTY;  Surgeon: Wellington Hampshire, MD;  Location: [place] [facility];  Service: Cardiovascular;  Laterality: Left;  popliteal   PERIPHERAL VASCULAR INTERVENTION Left 11/25/2018   Procedure: PERIPHERAL VASCULAR INTERVENTION;  Surgeon: Wellington Hampshire, MD;  Location: [place] [facility];  Service: Cardiovascular;  Laterality: Left;  tibial peroneal trunk and peroneal stents    stent  06/14/2010   left leg   STUMP REVISION Left 01/20/2019   Procedure: REVISION LEFT BELOW KNEE AMPUTATION;  Surgeon: [name], MD;  Location: [place];  Service: Orthopedics;  Laterality: Left;   US ECHOCARDIOGRAPHY  01/21/2006    moderate mitral annular ca+, mild MR, AOV moderately sclerotic.   Social History:  reports that he has quit smoking. He has quit using smokeless tobacco.  His smokeless tobacco use included chew. He reports that he does not drink alcohol and does not use drugs.  Allergies  Allergen Reactions   Iodinated Diagnostic Agents Other (See Comments)    caused Fever   Dye Fdc Red [Red Dye] Hives    Tolerated red Docusate, Niferex   Hydralazine     Family History  Problem Relation Age of Onset   Cancer Mother    Heart attack Father    Stroke Father    Heart attack Brother    Heart attack Brother     Prior to Admission medications   Medication Sig Start Date End Date Taking? Authorizing Provider  acetaminophen (TYLENOL) 500 MG tablet Take 500 mg by mouth every 6 (six) hours as needed for headache (pain).   Yes [provider]  amLODipine (NORVASC) 5 MG tablet Take 5 mg by mouth daily. 07/24/20  Yes [provider]  apixaban (ELIQUIS) 2.5 MG TABS tablet TAKE 1 TABLET(2.5 MG) BY MOUTH TWICE DAILY 10/05/20  Yes Almyra Deforest, PA  Ascorbic Acid (VITAMIN C) 1000 MG tablet Take 1,000 mg by mouth daily.    Yes [provider]  atorvastatin (LIPITOR) 40 MG tablet TAKE 1 TABLET(40 MG) BY MOUTH EVERY EVENING 07/19/20  Yes Hilty, Nadean Corwin, MD  betamethasone dipropionate 0.05 % cream Apply 1 application topically 2 (two) times daily. 12/03/19  Yes [provider]  carvedilol (COREG) 3.125 MG tablet TAKE 1 TABLET(3.125 MG) BY MOUTH TWICE DAILY WITH A MEAL 09/15/20  Yes Hilty, Nadean Corwin, MD  Cholecalciferol (VITAMIN D-3) 125 MCG (5000 UT) TABS Take 5,000 Units by mouth every evening.   Yes [provider]  clopidogrel (PLAVIX) 75 MG tablet TAKE 1 TABLET(75 MG) BY MOUTH DAILY WITH BREAKFAST 08/28/20  Yes Hilty, Nadean Corwin, MD  co-enzyme Q-10 30 MG capsule Take 100 mg by mouth daily.   Yes [provider]  doxazosin (CARDURA) 2 MG tablet TAKE 1 TABLET(2 MG) BY MOUTH  DAILY 09/15/20  Yes Hilty, Nadean Corwin, MD  famotidine (PEPCID) 20 MG tablet Take 1 tablet (20 mg total) by mouth daily. 11/12/18  Yes Johnson, Clanford L, MD  ferrous sulfate 325 (65 FE) MG tablet Take 325 mg by mouth 2 (two) times daily with a meal.   Yes [provider]  furosemide (LASIX) 40 MG tablet Take 2 tablets (80 mg total) by mouth daily. Take 2 tablets (80mg ) once daily in the morning. Can take an additional 1 tablet (40mg ) in the afternoon as needed for weight gain (3lbs in 1 day or 5lbs in 1 week). 06/19/20 10/11/20 Yes Barrett, Evelene Croon, PA-C  glipiZIDE (GLUCOTROL) 10 MG tablet Take 10 mg by mouth 2 (two) times daily.   Yes [provider]  latanoprost (XALATAN) 0.005 % ophthalmic solution Place 1 drop into both eyes at bedtime. 09/30/19  Yes [provider]  levothyroxine (SYNTHROID) 75 MCG tablet Take 75 mcg by mouth daily before breakfast.  02/12/19  Yes [provider]  nitroGLYCERIN (NITROSTAT) 0.4 MG SL tablet Place 1 tablet (0.4 mg total) under the tongue every 5 (five) minutes x 3 doses as needed for chest pain. 06/19/20  Yes Barrett, Evelene Croon, PA-C  ONETOUCH ULTRA test strip 1 each 2 (two) times daily. 04/15/19  Yes [provider]  polyethylene glycol (MIRALAX) 17 g packet Take 17 g by mouth daily as needed for moderate constipation. 12/09/18  Yes Mercy Riding, MD  potassium chloride SA (KLOR-CON) 20 MEQ tablet Take 2 tablets (40 mEq total) by mouth daily. 06/20/20  Yes Barrett, Evelene Croon, PA-C  pyridoxine (B-6) 100 MG tablet Take 100 mg by mouth daily.   Yes [provider]  QUERCETIN PO Take 1,000 mg by mouth daily.   Yes [provider]  traMADol (ULTRAM) 50 MG tablet Take 1 tablet (50 mg total) by mouth every 6 (six) hours as needed. 02/15/19  Yes Lovorn, Megan, MD  vitamin E 400 UNIT capsule Take 400 Units by mouth daily.   Yes [provider]  zinc gluconate 50 MG tablet Take 50 mg by mouth daily.   Yes [provider]  nitrofurantoin, macrocrystal-monohydrate, (MACROBID) 100 MG capsule Take 100 mg by mouth 2 (two) times daily. Patient not taking: Reported on 10/11/2020 08/23/20   [provider]     Consultants:    Procedures/Significant Events:    I have personally reviewed and interpreted all radiology studies and my findings are as above.   VENTILATOR SETTINGS:    Cultures 9/28 SARS coronavirus negative 9/28 influenza A/B negative 9/28 blood pending 9/28 urine pending   Antimicrobials: Anti-infectives (From admission, onward)    Start  Dose/Rate Route Frequency Ordered Stop   10/11/20 1130  piperacillin-tazobactam (ZOSYN) IVPB 3.375 g        3.375 g 100 mL/hr over 30 Minutes Intravenous  Once 10/11/20 1126 10/11/20 1240         Devices    LINES / TUBES:      Continuous Infusions:  Physical Exam: Vitals:   10/11/20 1046 10/11/20 1100 10/11/20 1211 10/11/20 1300  BP:  (!) 152/61 (!) 155/57 (!) 164/65  Pulse:  (!) 58 (!) 56 (!) 57  Resp:  15 16 13   Temp:      TempSrc:      SpO2:  94% 96% 99%  Weight: 98 kg     Height: 5\' 11"  (1.803 m)       Wt Readings from Last 3 Encounters:  10/11/20 98 kg  08/09/20 98 kg  07/19/20 98 kg    General: Sleepy but arousable.  Does not follow commands, No acute respiratory distress Eyes: negative scleral hemorrhage, negative anisocoria, negative icterus ENT: Negative Runny nose, negative gingival bleeding, Neck:  Negative scars, masses, torticollis, lymphadenopathy, JVD Lungs: Clear to auscultation bilaterally without wheezes or crackles Cardiovascular: Regular rate and rhythm without murmur gallop or rub normal S1 and S2 Abdomen: negative abdominal pain, nondistended, positive soft, bowel sounds, no rebound, no ascites, no appreciable mass Extremities: bilateral lower extremity BKA Skin: Negative rashes, lesions, ulcers Psychiatric: Unable to fully assess secondary to altered mental status Central  nervous system: Unable to fully assess secondary to altered mental status        Labs on Admission:  Basic Metabolic Panel: Recent Labs  Lab 10/11/20 1050  NA 135  K 3.7  CL 101  CO2 23  GLUCOSE 138*  BUN 45*  CREATININE 3.31*  CALCIUM 8.8*   Liver Function Tests: Recent Labs  Lab 10/11/20 1050  AST 20  ALT 16  ALKPHOS 45  BILITOT 0.6  PROT 7.7  ALBUMIN 3.5   Recent Labs  Lab 10/11/20 1050  LIPASE 26   No results for input(s): AMMONIA in the last 168 hours. CBC: Recent Labs  Lab 10/11/20 1050  WBC 16.0*  NEUTROABS 12.5*  HGB 11.7*  HCT 34.9*  MCV 99.7  PLT 183   Cardiac Enzymes: No results for input(s): CKTOTAL, CKMB, CKMBINDEX, TROPONINI in the last 168 hours.  BNP (last 3 results) Recent Labs    06/17/20 0313  BNP 412.0*    ProBNP (last 3 results) No results for input(s): PROBNP in the last 8760 hours.  CBG: No results for input(s): GLUCAP in the last 168 hours.  Radiological Exams on Admission: CT Head Wo Contrast  Result Date: 10/11/2020 CLINICAL DATA:  Altered mental status EXAM: CT HEAD WITHOUT CONTRAST TECHNIQUE: Contiguous axial images were obtained from the base of the skull through the vertex without intravenous contrast. COMPARISON:  Head CT 08/09/2020 FINDINGS: Brain: There is thin pachymeningeal thickening versus chronic subdural collections overlying the bilateral cerebral hemispheres measuring up to 3 mm in the coronal plane bilaterally, unchanged (40-71). There is no mass effect on the underlying brain parenchyma. There is no evidence of acute intracranial hemorrhage, new extra-axial fluid collection, or acute infarct. There is unchanged mild global parenchymal volume loss and chronic white matter microangiopathy. The ventricles are stable in size. There is no mass lesion. There is no midline shift. Vascular: There is calcification of the bilateral cavernous ICAs. Skull: Normal. Negative for fracture or focal lesion. Sinuses/Orbits:  There is mild mucosal thickening along the floor  of the left maxillary sinus. Bilateral lens implants are in place. The globes and orbits are otherwise unremarkable. Other: None. IMPRESSION: 1. No acute intracranial pathology. 2. Unchanged pachymeningeal thickening versus thin chronic subdural collections overlying the bilateral cerebral hemispheres. No mass effect on the underlying brain parenchyma or midline shift. Electronically Signed   By: Valetta Mole M.D.   On: 10/11/2020 12:21   DG Chest Port 1 View  Result Date: 10/11/2020 CLINICAL DATA:  Chest pain last night, history of CHF, COVID EXAM: PORTABLE CHEST 1 VIEW COMPARISON:  Chest radiograph 08/09/2020 FINDINGS: The heart is enlarged, grossly similar to the prior study. The mediastinal contours are prominent, also not significantly changed. There is a moderate layering left pleural effusion with adjacent consolidative opacity in the left base. This is slightly worsened since 08/09/2020. The left upper lobe remains well aerated. There is no focal airspace disease on the right. There is no significant right pleural effusion. There is no pneumothorax. There is no acute osseous abnormality. IMPRESSION: Interval increase in size of the moderate left pleural effusion with worsened consolidation in the left base since 08/09/2020. Electronically Signed   By: Valetta Mole M.D.   On: 10/11/2020 11:37    EKG: Independently reviewed.  Pending  Assessment/Plan Active Problems:   Peripheral vascular disease (HCC)   Essential hypertension, benign   Hyperlipidemia   Moderate aortic stenosis   Hypothyroidism   Chronic diastolic CHF (congestive heart failure) (HCC)   Atrial fibrillation, chronic   Type 2 diabetes mellitus with vascular disease (HCC)   Chronic kidney disease (CKD), stage IV (severe) ([place])   Essential hypertension   Urinary retention due to benign prostatic hyperplasia   S/P bilateral BKA (below knee amputation) (HCC)   Abnormal  urinalysis   Acute metabolic encephalopathy   AMS (altered mental status)   Uncontrolled type 2 diabetes mellitus with hyperglycemia, without long-term current use of insulin ([place])   Acute UTI   Demand ischemia (HCC)   Demand ischemia vs ACS -EKG pending - Trend troponin - Echocardiogram pending - Strict ins and out - Daily weight -  Essential HTN - Hold Coreg secondary to bradycardia - Amlodipine 5 mg daily  UTI -Continue empiric Zosyn.  Patient with previous history of Pseudomonas UTI. - Urine and blood culture pending -Normal saline 164ml/hr  PVD - S/p bilateral BKA -Plavix 75 mg daily  DM type II uncontrolled with complication - Hemoglobin A1c pending - Lipid panel pending  CKD stage IV -Trend creatinine   Altered mental status/metabolic encephalopathy - Correct underlying problems  Hypothyroidism - TSH pending -Synthroid 75 mcg daily      Code Status: DNR (DVT Prophylaxis: Heparin subcu Family Communication: 9/28 daughter at bedside for discussion of plan of care all questions answered Status is: Inpatient    Dispo: The patient is from: Home              Anticipated d/c is to: Home              Anticipated d/c date is: 2 days              Patient currently is not medically stable to d/c.     Data Reviewed: Care during the described time interval was provided by me .  I have reviewed this patient's available data, including medical history, events of note, physical examination, and all test results as part of my evaluation.   The patient is critically ill with multiple organ systems failure and requires high  complexity decision making for assessment and support, frequent evaluation and titration of therapies, application of advanced monitoring technologies and extensive interpretation of multiple databases. Critical Care Time devoted to patient care services described in this note  Time spent: 70 minutes   Mozell Hardacre, [place] [facility]

## 2020-10-11 NOTE — ED Notes (Signed)
Patient transported to CT 

## 2020-10-12 DIAGNOSIS — I482 Chronic atrial fibrillation, unspecified: Secondary | ICD-10-CM

## 2020-10-12 DIAGNOSIS — E1159 Type 2 diabetes mellitus with other circulatory complications: Secondary | ICD-10-CM

## 2020-10-12 DIAGNOSIS — I5032 Chronic diastolic (congestive) heart failure: Secondary | ICD-10-CM

## 2020-10-12 LAB — HEMOGLOBIN A1C
Hgb A1c MFr Bld: 6.6 % — ABNORMAL HIGH (ref 4.8–5.6)
Mean Plasma Glucose: 143 mg/dL

## 2020-10-12 LAB — COMPREHENSIVE METABOLIC PANEL
ALT: 13 U/L (ref 0–44)
AST: 18 U/L (ref 15–41)
Albumin: 2.9 g/dL — ABNORMAL LOW (ref 3.5–5.0)
Alkaline Phosphatase: 29 U/L — ABNORMAL LOW (ref 38–126)
Anion gap: 7 (ref 5–15)
BUN: 43 mg/dL — ABNORMAL HIGH (ref 8–23)
CO2: 23 mmol/L (ref 22–32)
Calcium: 8.2 mg/dL — ABNORMAL LOW (ref 8.9–10.3)
Chloride: 108 mmol/L (ref 98–111)
Creatinine, Ser: 3.23 mg/dL — ABNORMAL HIGH (ref 0.61–1.24)
GFR, Estimated: 17 mL/min — ABNORMAL LOW (ref >60)
Glucose, Bld: 60 mg/dL — ABNORMAL LOW (ref 70–99)
Potassium: 3.5 mmol/L (ref 3.5–5.1)
Sodium: 138 mmol/L (ref 135–145)
Total Bilirubin: 0.6 mg/dL (ref 0.3–1.2)
Total Protein: 6.3 g/dL — ABNORMAL LOW (ref 6.5–8.1)

## 2020-10-12 LAB — LIPID PANEL
Cholesterol: 83 mg/dL (ref 0–200)
HDL: 19 mg/dL — ABNORMAL LOW (ref >40)
LDL Cholesterol: 40 mg/dL (ref 0–99)
Total CHOL/HDL Ratio: 4.4 RATIO
Triglycerides: 122 mg/dL (ref <150)
VLDL: 24 mg/dL (ref 0–40)

## 2020-10-12 LAB — MAGNESIUM: Magnesium: 1.9 mg/dL (ref 1.7–2.4)

## 2020-10-12 LAB — TSH: TSH: 3.614 u[IU]/mL (ref 0.350–4.500)

## 2020-10-12 LAB — TROPONIN I (HIGH SENSITIVITY): Troponin I (High Sensitivity): 94 ng/L — ABNORMAL HIGH (ref <18)

## 2020-10-12 LAB — PHOSPHORUS: Phosphorus: 4.3 mg/dL (ref 2.5–4.6)

## 2020-10-12 MED ORDER — MORPHINE SULFATE (PF) 2 MG/ML IV SOLN
1.0000 mg | INTRAVENOUS | Status: DC | PRN
  Administered 2020-10-12 – 2020-10-15 (×5): 1 mg via INTRAVENOUS
  Filled 2020-10-12 (×6): qty 1

## 2020-10-12 MED ORDER — ONDANSETRON HCL 4 MG/2ML IJ SOLN
4.0000 mg | Freq: Four times a day (QID) | INTRAMUSCULAR | Status: DC | PRN
  Administered 2020-10-12: 4 mg via INTRAVENOUS
  Filled 2020-10-12: qty 2

## 2020-10-12 MED ORDER — ASPIRIN EC 81 MG PO TBEC
81.0000 mg | DELAYED_RELEASE_TABLET | Freq: Every day | ORAL | Status: DC
  Administered 2020-10-13: 81 mg via ORAL
  Filled 2020-10-12: qty 1

## 2020-10-12 MED ORDER — CHLORHEXIDINE GLUCONATE CLOTH 2 % EX PADS
6.0000 | MEDICATED_PAD | Freq: Every day | CUTANEOUS | Status: DC
  Administered 2020-10-12 – 2020-10-15 (×4): 6 via TOPICAL

## 2020-10-12 MED ORDER — PANTOPRAZOLE SODIUM 40 MG PO TBEC
40.0000 mg | DELAYED_RELEASE_TABLET | Freq: Every day | ORAL | Status: DC
  Administered 2020-10-12 – 2020-10-15 (×4): 40 mg via ORAL
  Filled 2020-10-12 (×3): qty 1

## 2020-10-12 MED ORDER — METHOCARBAMOL 1000 MG/10ML IJ SOLN
500.0000 mg | Freq: Three times a day (TID) | INTRAVENOUS | Status: DC | PRN
  Administered 2020-10-12: 500 mg via INTRAVENOUS
  Filled 2020-10-12: qty 500

## 2020-10-12 MED ORDER — PIPERACILLIN-TAZOBACTAM 3.375 G IVPB
3.3750 g | Freq: Two times a day (BID) | INTRAVENOUS | Status: DC
  Administered 2020-10-12 – 2020-10-13 (×3): 3.375 g via INTRAVENOUS
  Filled 2020-10-12 (×2): qty 50

## 2020-10-12 NOTE — Progress Notes (Signed)
PROGRESS NOTE    [name]  ZCH:[id number] DOB: May 15, 1927 DOA: 10/11/2020 PCP: Redmond School, MD    Chief Complaint  Patient presents with   Altered Mental Status    Brief admission Narrative:  As per H&P written by Dr. Sherral Hammers on 10/11/2020 [name] is a 87 y.o. WM PMHx DM type II uncontrolled with complication, HLD, s/p bilateral lower extremity BKA, chronic diastolic CHF, moderate to severe aortic stenosis, s/p NSTEMI, HTN, hypothyroidism, OSA (not on CPAP), urinary retention secondary to BPH indwelling Foley catheter.  CKD Johnston IV   Level 5 caveat secondary to altered mental status.  Patient arrives by EMS who is providing history.  They were called for unresponsiveness.  They said that the patient had complained of chest pain during the night and received 3 nitro and tramadol with improvement.  He also had his Foley changed yesterday.  Patient unable to provide any history.  Per daughter substernal chest pain with radiation to left axilla, positive nausea, positive diaphoresis.  Dr. Mali Hilty is patient's cardiologist.  Per daughter on a daily basis baseline A/O x4 with some mild forgetfulness.  Assessment & Plan: 1-acute metabolic encephalopathy -In the setting of UTI -Continue supportive care -Continue constant reorientation -Continue IV fluids -Continue current IV antibiotics and follow clinical response.  2-UTI -Patient with chronic indwelling catheter and reason for CAUTI -Indwelling catheter was exchanged prior to admission -Follow culture results -Continue current IV antibiotics. -Patient denies dysuria at this time.  3-chest pain/elevated troponin -No acute ischemic changes on telemetry or EKG -With concerns for demand ischemia -Follow echocardiogram evaluation -Continue aspirin, Plavix and statins -Cardiology service consulted.  4-peripheral vascular disease -Status post bilateral BKA -Continue risk factor modification -Continue aspirin and  Plavix.  5-essential hypertension -Continue amlodipine -Follow vital signs.  6-chronic kidney disease a Johnston IV -Appears to be at baseline -Continue to follow renal function trend.  7-type 2 diabetes mellitus with nephropathy -Follow A1c 6.6  -Continue sliding scale insulin -Continue holding oral hypoglycemic agents.  8-HLD -continue statins  9-hypothyroidism  -continue synthroid   28-NOMVEHM diastolic HF -Continue to follow daily weights and strict I's and O's -Currently no signs of fluid overload. -Patient denies shortness of breath or orthopnea.  11-chronic paroxysmal atrial fibrillation -Holding Eliquis while having difficulty taking by mouth. -Continue the use of heparin -Patient currently sinus bradycardia; holding beta-blocker. -Continue telemetry monitoring.  12-moderate aortic stenosis -Systolic murmur present on examination -Continue patient follow-up with cardiology 7   DVT prophylaxis: Heparin Code Status: DNR Family Communication: Daughter at bedside. Disposition:   Status is: Inpatient  Remains inpatient appropriate because:IV treatments appropriate due to intensity of illness or inability to take PO  Dispo: The patient is from: Home              Anticipated d/c is to: Home              Patient currently is not medically stable to d/c.   Difficult to place patient No       Consultants:  Cardiology service  Procedures:  See below for x-ray reports.  Antimicrobials:  Zosyn  Subjective: Complaining of chest discomfort radiated to his left arm and also nausea.  No shortness of breath, no fever, no vomiting, no abdominal pain.  Objective: Vitals:   10/11/20 2012 10/12/20 0000 10/12/20 0358 10/12/20 0400  BP: (!) 156/60 (!) 141/56 (!) 144/54   Pulse: (!) 54 (!) 54 (!) 46   Resp: 20 19 19  Temp: 97.9 F (36.6 C) 97.6 F (36.4 C) 98.2 F (36.8 C)   TempSrc: Oral Oral Oral   SpO2: 97% 96% 95%   Weight:    94.3 kg  Height:    5'  11" (1.803 m)    Intake/Output Summary (Last 24 hours) at 10/12/2020 1054 Last data filed at 10/12/2020 0600 Gross per 24 hour  Intake 1907.74 ml  Output --  Net 1907.74 ml   Filed Weights   10/11/20 1046 10/12/20 0400  Weight: 98 kg 94.3 kg    Examination:  General exam: afebrile, no vomiting, no SOB. Complaining of chest pain earlier radiated to his left arm; patient also expressed some nausea.  Respiratory system: Good air movement bilaterally; good saturation on room air. Cardiovascular system: S1 & S2 heard, regular rate controlled; positive systolic murmur, no JVD.  Murmurs, rubs, gallops or clicks. No pedal edema. Gastrointestinal system: Abdomen is nondistended, soft and nontender. No organomegaly or masses felt. Normal bowel sounds heard. Central nervous system: Alert and oriented to person only. No focal neurological deficits. Extremities: No cyanosis or clubbing.  Bilateral BKA. Skin: No petechiae. Psychiatry: Judgement and insight appear impaired in the setting of metabolic encephalopathy.   Data Reviewed: I have personally reviewed following labs and imaging studies  CBC: Recent Labs  Lab 10/11/20 1050  WBC 16.0*  NEUTROABS 12.5*  HGB 11.7*  HCT 34.9*  MCV 99.7  PLT 782    Basic Metabolic Panel: Recent Labs  Lab 10/11/20 1050 10/12/20 0107  NA 135 138  K 3.7 3.5  CL 101 108  CO2 23 23  GLUCOSE 138* 60*  BUN 45* 43*  CREATININE 3.31* 3.23*  CALCIUM 8.8* 8.2*  MG  --  1.9  PHOS  --  4.3    GFR: Estimated Creatinine Clearance: 17.5 mL/min (A) (by C-G formula based on SCr of 3.23 mg/dL (H)).  Liver Function Tests: Recent Labs  Lab 10/11/20 1050 10/12/20 0107  AST 20 18  ALT 16 13  ALKPHOS 45 29*  BILITOT 0.6 0.6  PROT 7.7 6.3*  ALBUMIN 3.5 2.9*   CBG: No results for input(s): GLUCAP in the last 168 hours.  Recent Results (from the past 240 hour(s))  Culture, blood (routine x 2)     Status: None (Preliminary result)   Collection  Time: 10/11/20 10:56 AM   Specimen: Left Antecubital; Blood  Result Value Ref Range Status   Specimen Description LEFT ANTECUBITAL  Final   Special Requests   Final    BOTTLES DRAWN AEROBIC AND ANAEROBIC Blood Culture adequate volume   Culture   Final    NO GROWTH < 24 HOURS Performed at [facility], [address], [place], [place] [address]    Report Status PENDING  Incomplete  Culture, blood (routine x 2)     Status: None (Preliminary result)   Collection Time: 10/11/20 11:01 AM   Specimen: Right Antecubital; Blood  Result Value Ref Range Status   Specimen Description RIGHT ANTECUBITAL  Final   Special Requests   Final    BOTTLES DRAWN AEROBIC AND ANAEROBIC Blood Culture results may not be optimal due to an excessive volume of blood received in culture bottles   Culture   Final    NO GROWTH < 24 HOURS Performed at [facility], [address] [place], [place] [address]    Report Status PENDING  Incomplete  Resp Panel by RT-PCR (Flu A&B, Covid) Urine, Catheterized     Status: None   Collection  Time: 10/11/20 12:15 PM   Specimen: Urine, Catheterized; Nasopharyngeal(NP) swabs in vial transport medium  Result Value Ref Range Status   SARS Coronavirus 2 by RT PCR NEGATIVE NEGATIVE Final    Comment: (NOTE) SARS-CoV-2 target nucleic acids are NOT DETECTED.  The SARS-CoV-2 RNA is generally detectable in upper respiratory specimens during the acute phase of infection. The lowest concentration of SARS-CoV-2 viral copies this assay can detect is 138 copies/mL. A negative result does not preclude SARS-Cov-2 infection and should not be used as the sole basis for treatment or other patient management decisions. A negative result may occur with  improper specimen collection/handling, submission of specimen other than nasopharyngeal swab, presence of viral mutation(s) within the areas targeted by this assay, and inadequate number of viral copies(<138 copies/mL). A negative result must be  combined with clinical observations, patient history, and epidemiological information. The expected result is Negative.  Fact Sheet for Patients:  EntrepreneurPulse.com.au  Fact Sheet for Healthcare Providers:  IncredibleEmployment.be  This test is no t yet approved or cleared by the [place] FDA and  has been authorized for detection and/or diagnosis of SARS-CoV-2 by FDA under an Emergency Use Authorization (EUA). This EUA will remain  in effect (meaning this test can be used) for the duration of the COVID-19 declaration under Section 564(b)(1) of the Act, 21 U.S.C.section 360bbb-3(b)(1), unless the authorization is terminated  or revoked sooner.       Influenza A by PCR NEGATIVE NEGATIVE Final   Influenza B by PCR NEGATIVE NEGATIVE Final    Comment: (NOTE) The Xpert Xpress SARS-CoV-2/FLU/RSV plus assay is intended as an aid in the diagnosis of influenza from Nasopharyngeal swab specimens and should not be used as a sole basis for treatment. Nasal washings and aspirates are unacceptable for Xpert Xpress SARS-CoV-2/FLU/RSV testing.  Fact Sheet for Patients: EntrepreneurPulse.com.au  Fact Sheet for Healthcare Providers: IncredibleEmployment.be  This test is not yet approved or cleared by the [place] FDA and has been authorized for detection and/or diagnosis of SARS-CoV-2 by FDA under an Emergency Use Authorization (EUA). This EUA will remain in effect (meaning this test can be used) for the duration of the COVID-19 declaration under Section 564(b)(1) of the Act, 21 U.S.C. section 360bbb-3(b)(1), unless the authorization is terminated or revoked.  Performed at [facility], [address], [place], [place] [address]   Culture, blood (routine x 2)     Status: None (Preliminary result)   Collection Time: 10/11/20  2:59 PM   Specimen: BLOOD LEFT HAND  Result Value Ref Range Status   Specimen  Description BLOOD LEFT HAND  Final   Special Requests   Final    Blood Culture results may not be optimal due to an excessive volume of blood received in culture bottles BOTTLES DRAWN AEROBIC AND ANAEROBIC   Culture   Final    NO GROWTH < 24 HOURS Performed at [facility], [address] [place], [place] [address]    Report Status PENDING  Incomplete  Culture, blood (routine x 2)     Status: None (Preliminary result)   Collection Time: 10/11/20  2:59 PM   Specimen: BLOOD RIGHT ARM  Result Value Ref Range Status   Specimen Description BLOOD RIGHT ARM  Final   Special Requests   Final    Blood Culture results may not be optimal due to an excessive volume of blood received in culture bottles BOTTLES DRAWN AEROBIC AND ANAEROBIC   Culture   Final    NO  GROWTH < 24 HOURS Performed at [facility], [address], [place], [place] [address]    Report Status PENDING  Incomplete    Radiology Studies: CT Head Wo Contrast  Result Date: 10/11/2020 CLINICAL DATA:  Altered mental status EXAM: CT HEAD WITHOUT CONTRAST TECHNIQUE: Contiguous axial images were obtained from the base of the skull through the vertex without intravenous contrast. COMPARISON:  Head CT 08/09/2020 FINDINGS: Brain: There is thin pachymeningeal thickening versus chronic subdural collections overlying the bilateral cerebral hemispheres measuring up to 3 mm in the coronal plane bilaterally, unchanged (40-71). There is no mass effect on the underlying brain parenchyma. There is no evidence of acute intracranial hemorrhage, new extra-axial fluid collection, or acute infarct. There is unchanged mild global parenchymal volume loss and chronic white matter microangiopathy. The ventricles are stable in size. There is no mass lesion. There is no midline shift. Vascular: There is calcification of the bilateral cavernous ICAs. Skull: Normal. Negative for fracture or focal lesion. Sinuses/Orbits: There is mild mucosal thickening along the floor  of the left maxillary sinus. Bilateral lens implants are in place. The globes and orbits are otherwise unremarkable. Other: None. IMPRESSION: 1. No acute intracranial pathology. 2. Unchanged pachymeningeal thickening versus thin chronic subdural collections overlying the bilateral cerebral hemispheres. No mass effect on the underlying brain parenchyma or midline shift. Electronically Signed   By: Valetta Mole M.D.   On: 10/11/2020 12:21   DG Chest Port 1 View  Result Date: 10/11/2020 CLINICAL DATA:  Chest pain last night, history of CHF, COVID EXAM: PORTABLE CHEST 1 VIEW COMPARISON:  Chest radiograph 08/09/2020 FINDINGS: The heart is enlarged, grossly similar to the prior study. The mediastinal contours are prominent, also not significantly changed. There is a moderate layering left pleural effusion with adjacent consolidative opacity in the left base. This is slightly worsened since 08/09/2020. The left upper lobe remains well aerated. There is no focal airspace disease on the right. There is no significant right pleural effusion. There is no pneumothorax. There is no acute osseous abnormality. IMPRESSION: Interval increase in size of the moderate left pleural effusion with worsened consolidation in the left base since 08/09/2020. Electronically Signed   By: Valetta Mole M.D.   On: 10/11/2020 11:37     Scheduled Meds:  amLODipine  5 mg Oral Daily   aspirin EC  81 mg Oral Daily   atorvastatin  40 mg Oral Daily   Chlorhexidine Gluconate Cloth  6 each Topical Daily   clopidogrel  75 mg Oral Daily   doxazosin  2 mg Oral Daily   heparin  5,000 Units Subcutaneous Q8H   levothyroxine  75 mcg Oral QAC breakfast   pantoprazole  40 mg Oral Daily   Continuous Infusions:  sodium chloride 100 mL/hr at 10/12/20 0030   methocarbamol (ROBAXIN) IV     piperacillin-tazobactam (ZOSYN)  IV 3.375 g (10/12/20 0952)     LOS: 1 day    Time spent: 35 minutes.    Barton Dubois, MD [facility]   To  contact the attending provider between 7A-7P or the covering provider during after hours 7P-7A, please log into the web site [email or web address] and access using universal [place] password for that web site. If you do not have the password, please call the hospital operator.  10/12/2020, 10:54 AM

## 2020-10-12 NOTE — Progress Notes (Signed)
Date and time results received: 10/12/20 0000 (use smartphrase ".now" to insert current time)  Test: trop Critical Value: 109  Name of Provider Notified: Dr. Carlynn Purl  Orders Received? Or Actions Taken?: Actions Taken: MD notified via page

## 2020-10-12 NOTE — TOC Initial Note (Signed)
Transition of Care [facility]) - Initial/Assessment Note    Patient Details  Name: [name] MRN: [id number] Date of Birth: [birth year]  Transition of Care [facility]) CM/SW Contact:    Shade Flood, LCSW Phone Number: 10/12/2020, 11:13 AM  Clinical Narrative:                  Pt admitted from home. Patient has a high risk for readmission. Patient has 24/7 care in the home. Plan is for patient to return home. Patient is active with [facility]. Has all equipment needed in the home. TOC will follow.            Expected Discharge Plan: [place] Barriers to Discharge: Continued Medical Work up   Patient Goals and CMS Choice Patient states their goals for this hospitalization and ongoing recovery are:: return home      Expected Discharge Plan and Services Expected Discharge Plan: [place] In-house Referral: Clinical Social Work   Post Acute Care Choice: Resumption of Svcs/PTA Provider Living arrangements for the past 2 months: Single Family Home                                      Prior Living Arrangements/Services Living arrangements for the past 2 months: Single Family Home Lives with:: Adult Children Patient language and need for interpreter reviewed:: Yes Do you feel safe going back to the place where you live?: Yes      Need for Family Participation in Patient Care: Yes (Comment) Care giver support system in place?: Yes (comment)   Criminal Activity/Legal Involvement Pertinent to Current Situation/Hospitalization: No - Comment as needed  Activities of Daily Living Home Assistive Devices/Equipment: Wheelchair, [facility] bed ADL Screening (condition at time of admission) Patient's cognitive ability adequate to safely complete daily activities?: No Is the patient deaf or have difficulty hearing?: Yes Does the patient have difficulty seeing, even when wearing glasses/contacts?: No Does the patient have  difficulty concentrating, remembering, or making decisions?: Yes Patient able to express need for assistance with ADLs?: Yes Does the patient have difficulty dressing or bathing?: Yes Independently performs ADLs?: No Communication: Independent Dressing (OT): Dependent Is this a change from baseline?: Pre-admission baseline Grooming: Needs assistance Is this a change from baseline?: Pre-admission baseline Feeding: Independent Bathing: Needs assistance Is this a change from baseline?: Pre-admission baseline Toileting: Needs assistance Is this a change from baseline?: Pre-admission baseline In/Out Bed: Dependent Is this a change from baseline?: Pre-admission baseline Walks in Home: Dependent Is this a change from baseline?: Pre-admission baseline Does the patient have difficulty walking or climbing stairs?: Yes Weakness of Legs: Both Weakness of Arms/Hands: Both  Permission Sought/Granted                  Emotional Assessment       Orientation: : Oriented to Self Alcohol / Substance Use: Not Applicable Psych Involvement: No (comment)  Admission diagnosis:  Acute metabolic encephalopathy [A41.66] Patient Active Problem List   Diagnosis Date Noted   COVID-19 virus infection 08/09/2020   Demand ischemia ([place]) 06/18/2020   Uncontrolled type 2 diabetes mellitus with hyperglycemia, without long-term current use of insulin ([place]) 02/25/2020   CKD (chronic kidney disease) stage 4, GFR 15-29 ml/min ([place]) 02/25/2020   Acute UTI    AMS (altered mental status) 02/24/2020   PNA (pneumonia) 12/06/2019   Sepsis due to  PNA 97/67/3419   Acute metabolic encephalopathy 37/90/2409   AKI (acute kidney injury) [facility])    Aortic valve stenosis    Palliative care encounter    Acute kidney injury superimposed on CKD ([place]) 10/04/2019   Hypokalemia 10/04/2019   Weakness generalized 04/05/2019   S/P BKA (below knee amputation) unilateral, left ([place]) 02/15/2019   Pressure injury of skin  02/03/2019   TIA (transient ischemic attack) 02/03/2019   Transient speech disturbance 02/02/2019   Chronic indwelling Foley catheter 01/22/2019   Postoperative wound infection 01/20/2019   Normocytic anemia 01/18/2019   Cellulitis 01/18/2019   Infection of deep incisional surgical site after procedure    Abnormal urinalysis    Labile blood glucose    Urinary retention    S/P bilateral BKA (below knee amputation) ([place])    Acute blood loss anemia    Urinary retention due to benign prostatic hyperplasia 12/28/2018   Unilateral complete BKA, left, initial encounter ([place]) 12/25/2018   Dehiscence of amputation stump ([place])    History of transmetatarsal amputation of left foot ([place]) 12/09/2018   Critical limb ischemia with history of revascularization of same extremity ([place])    Gangrene of left foot ([place])    Diabetic ulcer of toe of left foot associated with type 2 diabetes mellitus ([place])    Cellulitis of left lower extremity    Toe infection 11/18/2018   Peripheral edema    Acute on chronic diastolic CHF (congestive heart failure) ([place]) 11/09/2018   S/P BKA (below knee amputation) unilateral, right ([place]) 10/05/2018   Hyponatremia    Essential hypertension    Postoperative pain    Pain of left heel    Unable to maintain weight-bearing    Type 2 diabetes mellitus with diabetic peripheral angiopathy with gangrene ([place])    Anemia due to acute blood loss    Chronic kidney disease (CKD), stage IV (severe) (HCC)    Acquired absence of right leg below knee ([place]) 09/07/2018   Gangrene of right foot ([place])    Goals of care, counseling/discussion    Palliative care by specialist    Type 2 diabetes mellitus with vascular disease ([place])    Severe protein-calorie malnutrition ([place])    Ischemic foot 08/20/2018   Critical lower limb ischemia ([place]) 08/03/2018   Type 2 diabetes mellitus with foot ulcer, without long-term current use of insulin (HCC)    Gastroesophageal reflux disease    Cellulitis  of great toe of right foot 05/29/2018   Atrial fibrillation, chronic    Chest pain 07/25/2017   PAF (paroxysmal atrial fibrillation) ([place]) 07/25/2017   BPH (benign prostatic hyperplasia) 07/25/2017   Shortness of breath 05/11/2017   Hypothyroidism 05/11/2017   Chronic diastolic CHF (congestive heart failure) ([place]) 05/11/2017   Moderate aortic stenosis 06/12/2016   RBBB 06/12/2016   Peripheral vascular disease ([place]) 08/04/2012   Essential hypertension, benign 08/04/2012   Hyperlipidemia 08/04/2012   Dizziness 08/04/2012   PCP:  Redmond School, MD Pharmacy:   [facility] [place], [place] AT [place] [address] [place] [address] Phone: [phone] Fax: [phone]     Social Determinants of Health (SDOH) Interventions    Readmission Risk Interventions Readmission Risk Prevention Plan 06/19/2020 02/25/2020 12/13/2019  Transportation Screening Complete Complete -  PCP or Specialist Appt within 5-7 Days Complete - -  PCP or Specialist Appt within 3-5 Days - - -  Not Complete comments - - -  Home Care  Screening Complete - -  Medication Review (RN CM) Complete - -  HRI or Home Care Consult - Complete -  HRI or Home Care Consult comments - - -  Social Work Consult for Recovery Care Planning/Counseling - Complete -  Palliative Care Screening - Complete -  Medication Review Press photographer) - Complete -  PCP or Specialist appointment within 3-5 days of discharge - - Complete  HRI or [place] recent data might be hidden

## 2020-10-13 DIAGNOSIS — I248 Other forms of acute ischemic heart disease: Secondary | ICD-10-CM

## 2020-10-13 DIAGNOSIS — I480 Paroxysmal atrial fibrillation: Secondary | ICD-10-CM | POA: Diagnosis not present

## 2020-10-13 DIAGNOSIS — I5030 Unspecified diastolic (congestive) heart failure: Secondary | ICD-10-CM

## 2020-10-13 DIAGNOSIS — I350 Nonrheumatic aortic (valve) stenosis: Secondary | ICD-10-CM | POA: Diagnosis not present

## 2020-10-13 LAB — URINE CULTURE
Culture: >=100000 — AB
Culture: >=100000 — AB

## 2020-10-13 LAB — COMPREHENSIVE METABOLIC PANEL
ALT: 19 U/L (ref 0–44)
AST: 90 U/L — ABNORMAL HIGH (ref 15–41)
Albumin: 3 g/dL — ABNORMAL LOW (ref 3.5–5.0)
Alkaline Phosphatase: 27 U/L — ABNORMAL LOW (ref 38–126)
Anion gap: 10 (ref 5–15)
BUN: 39 mg/dL — ABNORMAL HIGH (ref 8–23)
CO2: 23 mmol/L (ref 22–32)
Calcium: 8.6 mg/dL — ABNORMAL LOW (ref 8.9–10.3)
Chloride: 104 mmol/L (ref 98–111)
Creatinine, Ser: 3.25 mg/dL — ABNORMAL HIGH (ref 0.61–1.24)
GFR, Estimated: 17 mL/min — ABNORMAL LOW (ref >60)
Glucose, Bld: 158 mg/dL — ABNORMAL HIGH (ref 70–99)
Potassium: 3.6 mmol/L (ref 3.5–5.1)
Sodium: 137 mmol/L (ref 135–145)
Total Bilirubin: 0.5 mg/dL (ref 0.3–1.2)
Total Protein: 6.8 g/dL (ref 6.5–8.1)

## 2020-10-13 LAB — MAGNESIUM: Magnesium: 2 mg/dL (ref 1.7–2.4)

## 2020-10-13 LAB — HEMOGLOBIN A1C
Hgb A1c MFr Bld: 6.6 % — ABNORMAL HIGH (ref 4.8–5.6)
Mean Plasma Glucose: 143 mg/dL

## 2020-10-13 LAB — PHOSPHORUS: Phosphorus: 3.8 mg/dL (ref 2.5–4.6)

## 2020-10-13 LAB — GLUCOSE, CAPILLARY
Glucose-Capillary: 164 mg/dL — ABNORMAL HIGH (ref 70–99)
Glucose-Capillary: 153 mg/dL — ABNORMAL HIGH (ref 70–99)
Glucose-Capillary: 182 mg/dL — ABNORMAL HIGH (ref 70–99)

## 2020-10-13 MED ORDER — INSULIN ASPART 100 UNIT/ML IJ SOLN
0.0000 [IU] | Freq: Three times a day (TID) | INTRAMUSCULAR | Status: DC
  Administered 2020-10-13 – 2020-10-14 (×2): 2 [IU] via SUBCUTANEOUS
  Administered 2020-10-14: 1 [IU] via SUBCUTANEOUS

## 2020-10-13 MED ORDER — APIXABAN 2.5 MG PO TABS
2.5000 mg | ORAL_TABLET | Freq: Two times a day (BID) | ORAL | Status: DC
  Administered 2020-10-13 – 2020-10-15 (×5): 2.5 mg via ORAL
  Filled 2020-10-13 (×5): qty 1

## 2020-10-13 MED ORDER — ISOSORBIDE MONONITRATE ER 60 MG PO TB24
30.0000 mg | ORAL_TABLET | Freq: Every day | ORAL | Status: DC
  Administered 2020-10-13 – 2020-10-15 (×3): 30 mg via ORAL
  Filled 2020-10-13 (×3): qty 1

## 2020-10-13 MED ORDER — SODIUM CHLORIDE 0.9 % IV SOLN
1.0000 g | Freq: Two times a day (BID) | INTRAVENOUS | Status: AC
  Administered 2020-10-13 – 2020-10-15 (×5): 1 g via INTRAVENOUS
  Filled 2020-10-13 (×5): qty 1

## 2020-10-13 MED ORDER — CARVEDILOL 3.125 MG PO TABS
3.1250 mg | ORAL_TABLET | Freq: Two times a day (BID) | ORAL | Status: DC
  Administered 2020-10-13 – 2020-10-15 (×4): 3.125 mg via ORAL
  Filled 2020-10-13 (×4): qty 1

## 2020-10-13 NOTE — Consult Note (Addendum)
Cardiology Consultation:   Patient ID: [name] MRN: [id number]; DOB: 1927/06/23  Admit date: 10/11/2020 Date of Consult: 10/13/2020  PCP:  Redmond School, MD   [place] [facility] Cardiologist:  Pixie Casino, MD        Patient Profile:   [name] is a 87 y.o. male with a hx of presumed CAD (s/p NSTEMI in 09/2019 with medical management recommended given his comorbidities), PAD (s/p bilateral BKA's), moderate to severe AS, HFpEF, paroxysmal atrial fibrillation, HTN, HLD, Type 2 DM and Stage 3-4 CKD who is being seen 10/13/2020 for the evaluation of chest pain and elevated troponin values at the request of Dr. Dyann Kief.  History of Present Illness:   [name] was admitted for evaluation of chest pain in 06/2020 and was found to have elevated Hs Troponin values up to 1224. Echo that admission showed a preserved EF of 65-70% with moderate to severe AS and was treated for a CHF exacerbation that admission. Medical management was recommended of his presumed CAD.   He was again admitted to [facility] in 07/2020 for acute metabolic encephalopathy in the setting of a UTI. No changes were made to his cardiac medications that admission.  He presented back to [facility] ED on 10/11/2020 for evaluation of worsening mental status and also reports of chest pain. Initial labs showed WBC 16.0, Hgb 11.7, platelets 183, Na+ 135, K+ 3.7 and creatinine 3.31 (at 3.68 in 07/2020). Urine culture positive for Klebsiella Pneumoniae. Initial Hs Troponin 76 with repeat values of 93, 120, 109 and 94. Hgb A1c 6.6. FLP shows LDL 40. CXR showing a moderate left pleural effusion with worsened consolidation when compared to prior imaging. CT Head showed no acute intracranial abnormalities. EKG performed on admission but not available for review in the system.   In talking with the patient and his daughter today, most history is provided by his daughter. He has 24/7 caregiver support at home by family members  and she reports he is mostly bedbound/wheelchair-bound at baseline and they use a lift for transfers. The day of admission, he reported mostly left shoulder discomfort and she gave him several sublingual nitroglycerin with no improvement in his symptoms.  He did take a Tramadol and experienced improvement but had decreased responsiveness afterwards. She reports he has previously taken the medication without side effects. He has not reported any recent nausea, vomiting or diaphoresis. No reported orthopnea or PND. She reports his mentation has improved since being started on antibiotic therapy for his UTI.   Past Medical History:  Diagnosis Date   Arthritis    Borderline diabetic    CHF (congestive heart failure) (HCC)    Chronic kidney disease    Diabetes mellitus without complication ([place])    Dysrhythmia    Glaucoma    Hyperlipidemia    Hypertension    Hypothyroidism    Myocardial infarction [facility])    PVD (peripheral vascular disease) ([place])    Sleep apnea    Cpap ordered but doesnt use   Urinary retention due to benign prostatic hyperplasia 12/28/2018    Past Surgical History:  Procedure Laterality Date   ABDOMINAL AORTOGRAM W/LOWER EXTREMITY N/A 07/27/2018   Procedure: ABDOMINAL AORTOGRAM W/LOWER EXTREMITY;  Surgeon: Lorretta Harp, MD;  Location: [place] [facility];  Service: Cardiovascular;  Laterality: N/A;   AMPUTATION Right 08/28/2018   Procedure: RIGHT BELOW KNEE AMPUTATION;  Surgeon: [name], MD;  Location: [place];  Service: Orthopedics;  Laterality: Right;  AMPUTATION Left 12/02/2018   Procedure: LEFT TRANSMETATARSAL AMPUTATION AND NEGATIVE PRESSURE WOUND VAC PLACEMENT;  Surgeon: [name], MD;  Location: [place];  Service: Orthopedics;  Laterality: Left;   AMPUTATION Left 12/18/2018   Procedure: LEFT BELOW KNEE AMPUTATION;  Surgeon: [name], MD;  Location: [place];  Service: Orthopedics;  Laterality: Left;   APPENDECTOMY  1943   BELOW KNEE LEG AMPUTATION Left  12/18/2018   CATARACT EXTRACTION  2012   x2   COLONOSCOPY N/A 11/01/2014   Procedure: COLONOSCOPY;  Surgeon: Aviva Signs, MD;  [facility];  Service: Gastroenterology;  Laterality: N/A;   ESOPHAGOGASTRODUODENOSCOPY N/A 11/01/2014   Procedure: ESOPHAGOGASTRODUODENOSCOPY (EGD);  Surgeon: Aviva Signs, MD;  [facility];  Service: Gastroenterology;  Laterality: N/A;   HERNIA REPAIR Left    INGUINAL HERNIA REPAIR Right 03/01/2013   Procedure: RIGHT INGUINAL HERNIORRHAPHY;  Surgeon: Jamesetta So, MD;  Location: [facility];  Service: General;  Laterality: Right;   INSERTION OF MESH Right 03/01/2013   Procedure: INSERTION OF MESH;  Surgeon: Jamesetta So, MD;  Location: [facility];  Service: General;  Laterality: Right;   LOWER EXTREMITY ANGIOGRAPHY Right 08/25/2018   Procedure: LOWER EXTREMITY ANGIOGRAPHY;  Surgeon: Wellington Hampshire, MD;  Location: [place] [facility];  Service: Cardiovascular;  Laterality: Right;   LOWER EXTREMITY ANGIOGRAPHY N/A 11/25/2018   Procedure: LOWER EXTREMITY ANGIOGRAPHY;  Surgeon: Wellington Hampshire, MD;  Location: [place] [facility];  Service: Cardiovascular;  Laterality: N/A;   NM MYOCAR PERF WALL MOTION  06/05/2010   no significant ischemia   PERIPHERAL VASCULAR ATHERECTOMY Right 08/03/2018   Procedure: PERIPHERAL VASCULAR ATHERECTOMY;  Surgeon: Lorretta Harp, MD;  Location: [place] [facility];  Service: Cardiovascular;  Laterality: Right;   PERIPHERAL VASCULAR ATHERECTOMY Left 11/23/2018   Procedure: PERIPHERAL VASCULAR ATHERECTOMY;  Surgeon: Lorretta Harp, MD;  Location: [place] [facility];  Service: Cardiovascular;  Laterality: Left;  sfa with dc balloon   PERIPHERAL VASCULAR BALLOON ANGIOPLASTY Left 11/25/2018   Procedure: PERIPHERAL VASCULAR BALLOON ANGIOPLASTY;  Surgeon: Wellington Hampshire, MD;  Location: [place] [facility];  Service: Cardiovascular;  Laterality: Left;  popliteal   PERIPHERAL VASCULAR INTERVENTION Left 11/25/2018    Procedure: PERIPHERAL VASCULAR INTERVENTION;  Surgeon: Wellington Hampshire, MD;  Location: [place] [facility];  Service: Cardiovascular;  Laterality: Left;  tibial peroneal trunk and peroneal stents    stent  06/14/2010   left leg   STUMP REVISION Left 01/20/2019   Procedure: REVISION LEFT BELOW KNEE AMPUTATION;  Surgeon: [name], MD;  Location: [place];  Service: Orthopedics;  Laterality: Left;   US ECHOCARDIOGRAPHY  01/21/2006   moderate mitral annular ca+, mild MR, AOV moderately sclerotic.     Home Medications:  Prior to Admission medications   Medication Sig Start Date End Date Taking? Authorizing Provider  acetaminophen (TYLENOL) 500 MG tablet Take 500 mg by mouth every 6 (six) hours as needed for headache (pain).   Yes [provider]  amLODipine (NORVASC) 5 MG tablet Take 5 mg by mouth daily. 07/24/20  Yes [provider]  apixaban (ELIQUIS) 2.5 MG TABS tablet TAKE 1 TABLET(2.5 MG) BY MOUTH TWICE DAILY 10/05/20  Yes Almyra Deforest, PA  Ascorbic Acid (VITAMIN C) 1000 MG tablet Take 1,000 mg by mouth daily.    Yes [provider]  atorvastatin (LIPITOR) 40 MG tablet TAKE 1 TABLET(40 MG) BY MOUTH EVERY EVENING 07/19/20  Yes Hilty, Nadean Corwin, MD  betamethasone dipropionate 0.05 % cream Apply 1 application topically 2 (two) times daily. 12/03/19  Yes [provider]  carvedilol (COREG) 3.125 MG tablet TAKE 1 TABLET(3.125 MG) BY MOUTH TWICE DAILY WITH A MEAL 09/15/20  Yes Hilty, Nadean Corwin, MD  Cholecalciferol (VITAMIN D-3) 125 MCG (5000 UT) TABS Take 5,000 Units by mouth every evening.   Yes [provider]  clopidogrel (PLAVIX) 75 MG tablet TAKE 1 TABLET(75 MG) BY MOUTH DAILY WITH BREAKFAST 08/28/20  Yes Hilty, Nadean Corwin, MD  co-enzyme Q-10 30 MG capsule Take 100 mg by mouth daily.   Yes [provider]  doxazosin (CARDURA) 2 MG tablet TAKE 1 TABLET(2 MG) BY MOUTH DAILY 09/15/20  Yes Hilty, Nadean Corwin, MD  famotidine (PEPCID) 20 MG tablet Take 1 tablet  (20 mg total) by mouth daily. 11/12/18  Yes Johnson, Clanford L, MD  ferrous sulfate 325 (65 FE) MG tablet Take 325 mg by mouth 2 (two) times daily with a meal.   Yes [provider]  furosemide (LASIX) 40 MG tablet Take 2 tablets (80 mg total) by mouth daily. Take 2 tablets (80mg ) once daily in the morning. Can take an additional 1 tablet (40mg ) in the afternoon as needed for weight gain (3lbs in 1 day or 5lbs in 1 week). 06/19/20 10/11/20 Yes Barrett, Evelene Croon, PA-C  glipiZIDE (GLUCOTROL) 10 MG tablet Take 10 mg by mouth 2 (two) times daily.   Yes [provider]  latanoprost (XALATAN) 0.005 % ophthalmic solution Place 1 drop into both eyes at bedtime. 09/30/19  Yes [provider]  levothyroxine (SYNTHROID) 75 MCG tablet Take 75 mcg by mouth daily before breakfast.  02/12/19  Yes [provider]  nitroGLYCERIN (NITROSTAT) 0.4 MG SL tablet Place 1 tablet (0.4 mg total) under the tongue every 5 (five) minutes x 3 doses as needed for chest pain. 06/19/20  Yes Barrett, Evelene Croon, PA-C  ONETOUCH ULTRA test strip 1 each 2 (two) times daily. 04/15/19  Yes [provider]  polyethylene glycol (MIRALAX) 17 g packet Take 17 g by mouth daily as needed for moderate constipation. 12/09/18  Yes Mercy Riding, MD  potassium chloride SA (KLOR-CON) 20 MEQ tablet Take 2 tablets (40 mEq total) by mouth daily. 06/20/20  Yes Barrett, Evelene Croon, PA-C  pyridoxine (B-6) 100 MG tablet Take 100 mg by mouth daily.   Yes [provider]  QUERCETIN PO Take 1,000 mg by mouth daily.   Yes [provider]  traMADol (ULTRAM) 50 MG tablet Take 1 tablet (50 mg total) by mouth every 6 (six) hours as needed. 02/15/19  Yes Lovorn, Megan, MD  vitamin E 400 UNIT capsule Take 400 Units by mouth daily.   Yes [provider]  zinc gluconate 50 MG tablet Take 50 mg by mouth daily.   Yes [provider]  nitrofurantoin, macrocrystal-monohydrate, (MACROBID) 100 MG capsule Take  100 mg by mouth 2 (two) times daily. Patient not taking: Reported on 10/11/2020 08/23/20   [provider]    Inpatient Medications: Scheduled Meds:  amLODipine  5 mg Oral Daily   aspirin EC  81 mg Oral Daily   atorvastatin  40 mg Oral Daily   Chlorhexidine Gluconate Cloth  6 each Topical Daily   clopidogrel  75 mg Oral Daily   doxazosin  2 mg Oral Daily   heparin  5,000 Units Subcutaneous Q8H   levothyroxine  75 mcg Oral QAC breakfast   pantoprazole  40 mg Oral Daily  Continuous Infusions:  sodium chloride 10 mL/hr at 10/13/20 0300   methocarbamol (ROBAXIN) IV 500 mg (10/12/20 1404)   piperacillin-tazobactam (ZOSYN)  IV Stopped (10/13/20 0233)   PRN Meds: acetaminophen **OR** acetaminophen, methocarbamol (ROBAXIN) IV, morphine injection, nitroGLYCERIN, ondansetron (ZOFRAN) IV  Allergies:    Allergies  Allergen Reactions   Iodinated Diagnostic Agents Other (See Comments)    caused Fever   Dye Fdc Red [Red Dye] Hives    Tolerated red Docusate, Niferex   Hydralazine     Social History:   Social History   Socioeconomic History   Marital status: Widowed    Spouse name: Not on file   Number of children: 3   Years of education: 81 th   Highest education level: Not on file  Occupational History   Occupation: Retired Psychologist, sport and exercise  Tobacco Use   Smoking status: Former   Smokeless tobacco: Former    Types: Nurse, children's Use: Never used  Substance and Sexual Activity   Alcohol use: No   Drug use: No   Sexual activity: Not Currently  Other Topics Concern   Not on file  Social History Narrative    Has 2 children! Very supportive.    Social Determinants of Health   Financial Resource Strain: Not on file  Food Insecurity: No Food Insecurity   Worried About Charity fundraiser in the Last Year: Never true   Ran Out of Food in the Last Year: Never true  Transportation Needs: No Transportation Needs   Lack of Transportation (Medical): No   Lack of  Transportation (Non-Medical): No  Physical Activity: Not on file  Stress: Not on file  Social Connections: Not on file  Intimate Partner Violence: Not on file    Family History:    Family History  Problem Relation Age of Onset   Cancer Mother    Heart attack Father    Stroke Father    Heart attack Brother    Heart attack Brother      ROS:  Please see the history of present illness.   All other ROS reviewed and negative.     Physical Exam/Data:   Vitals:   10/12/20 1435 10/12/20 2107 10/13/20 0500 10/13/20 0535  BP: 140/61 139/71  138/71  Pulse: (!) 55 71  68  Resp: 19 17  17   Temp: 98.3 F (36.8 C) 98.2 F (36.8 C)  (!) 97.5 F (36.4 C)  TempSrc: Oral   Oral  SpO2: 95% 98%  92%  Weight:   94.4 kg   Height:        Intake/Output Summary (Last 24 hours) at 10/13/2020 0941 Last data filed at 10/13/2020 0272 Gross per 24 hour  Intake 1205.85 ml  Output 1000 ml  Net 205.85 ml   Last 3 Weights 10/13/2020 10/12/2020 10/11/2020  Weight (lbs) 208 lb 1.8 oz 207 lb 14.3 oz 216 lb 0.8 oz  Weight (kg) 94.4 kg 94.3 kg 98 kg     Body mass index is 29.03 kg/m.  General: Elderly male appearing in no acute distress.  HEENT: normal Neck: no JVD Vascular: No carotid bruits; Distal pulses 2+ bilaterally Cardiac:  normal S1, S2; RRR with occasional ectopic beats. 3/6 SEM along RUSB.  Lungs: decreased breath sounds along left base.  Abd: soft, nontender, no hepatomegaly  Ext: Bilateral BKA's.  Skin: warm and dry  Neuro:  CNs 2-12 intact, no focal abnormalities noted Psych: A&Ox2 (person, place).   EKG:  The EKG  was personally reviewed and demonstrates: Not listed in the system at this time and not in the shadow chart.  Telemetry:  Telemetry was personally reviewed and demonstrates: NSR, HR in 70's to 80's. Occasional PVC's.   Relevant CV Studies:  Echocardiogram: 06/2020 IMPRESSIONS     1. Left ventricular ejection fraction, by estimation, is 65 to 70%. The  left  ventricle has normal function. The left ventricle has no regional  wall motion abnormalities. There is mild concentric left ventricular  hypertrophy. Left ventricular diastolic  parameters are consistent with Grade II diastolic dysfunction  (pseudonormalization). Elevated left atrial pressure.   2. Right ventricular systolic function is normal. The right ventricular  size is normal. There is mildly elevated pulmonary artery systolic  pressure.   3. Left atrial size was mildly dilated.   4. The mitral valve is normal in structure. Mild mitral valve  regurgitation. No evidence of mitral stenosis.   5. The aortic valve is tricuspid. There is severe calcifcation of the  aortic valve. There is severe thickening of the aortic valve. Aortic valve  regurgitation is mild. Moderate to severe aortic valve stenosis.   6. The inferior vena cava is dilated in size with >50% respiratory  variability, suggesting right atrial pressure of 8 mmHg.   Laboratory Data:  High Sensitivity Troponin:   Recent Labs  Lab 10/11/20 1050 10/11/20 1253 10/11/20 1946 10/11/20 2246 10/12/20 0107  TROPONINIHS 76* 93* 120* 109* 94*     Chemistry Recent Labs  Lab 10/11/20 1050 10/12/20 0107 10/13/20 0526  NA 135 138 137  K 3.7 3.5 3.6  CL 101 108 104  CO2 23 23 23   GLUCOSE 138* 60* 158*  BUN 45* 43* 39*  CREATININE 3.31* 3.23* 3.25*  CALCIUM 8.8* 8.2* 8.6*  MG  --  1.9 2.0  GFRNONAA 17* 17* 17*  ANIONGAP 11 7 10     Recent Labs  Lab 10/11/20 1050 10/12/20 0107 10/13/20 0526  PROT 7.7 6.3* 6.8  ALBUMIN 3.5 2.9* 3.0*  AST 20 18 90*  ALT 16 13 19   ALKPHOS 45 29* 27*  BILITOT 0.6 0.6 0.5   Lipids  Recent Labs  Lab 10/12/20 0107  CHOL 83  TRIG 122  HDL 19*  LDLCALC 40  CHOLHDL 4.4    Hematology Recent Labs  Lab 10/11/20 1050  WBC 16.0*  RBC 3.50*  HGB 11.7*  HCT 34.9*  MCV 99.7  MCH 33.4  MCHC 33.5  RDW 14.7  PLT 183   Thyroid  Recent Labs  Lab 10/12/20 0107  TSH 3.614     BNPNo results for input(s): BNP, PROBNP in the last 168 hours.  DDimer No results for input(s): DDIMER in the last 168 hours.   Radiology/Studies:  CT Head Wo Contrast  Result Date: 10/11/2020 CLINICAL DATA:  Altered mental status EXAM: CT HEAD WITHOUT CONTRAST TECHNIQUE: Contiguous axial images were obtained from the base of the skull through the vertex without intravenous contrast. COMPARISON:  Head CT 08/09/2020 FINDINGS: Brain: There is thin pachymeningeal thickening versus chronic subdural collections overlying the bilateral cerebral hemispheres measuring up to 3 mm in the coronal plane bilaterally, unchanged (40-71). There is no mass effect on the underlying brain parenchyma. There is no evidence of acute intracranial hemorrhage, new extra-axial fluid collection, or acute infarct. There is unchanged mild global parenchymal volume loss and chronic white matter microangiopathy. The ventricles are stable in size. There is no mass lesion. There is no midline shift. Vascular: There is calcification of the  bilateral cavernous ICAs. Skull: Normal. Negative for fracture or focal lesion. Sinuses/Orbits: There is mild mucosal thickening along the floor of the left maxillary sinus. Bilateral lens implants are in place. The globes and orbits are otherwise unremarkable. Other: None. IMPRESSION: 1. No acute intracranial pathology. 2. Unchanged pachymeningeal thickening versus thin chronic subdural collections overlying the bilateral cerebral hemispheres. No mass effect on the underlying brain parenchyma or midline shift. Electronically Signed   By: Valetta Mole M.D.   On: 10/11/2020 12:21   DG Chest Port 1 View  Result Date: 10/11/2020 CLINICAL DATA:  Chest pain last night, history of CHF, COVID EXAM: PORTABLE CHEST 1 VIEW COMPARISON:  Chest radiograph 08/09/2020 FINDINGS: The heart is enlarged, grossly similar to the prior study. The mediastinal contours are prominent, also not significantly changed. There is  a moderate layering left pleural effusion with adjacent consolidative opacity in the left base. This is slightly worsened since 08/09/2020. The left upper lobe remains well aerated. There is no focal airspace disease on the right. There is no significant right pleural effusion. There is no pneumothorax. There is no acute osseous abnormality. IMPRESSION: Interval increase in size of the moderate left pleural effusion with worsened consolidation in the left base since 08/09/2020. Electronically Signed   By: Valetta Mole M.D.   On: 10/11/2020 11:37     Assessment and Plan:   1. Left Arm Pain/Presumed CAD - He is s/p NSTEMI in 09/2019 with medical management recommended given his co-morbidities. Presents with left-sided shoulder pain which did not improve with SL NTG but did improve with Tramadol.  - Initial Hs Troponin 76 with repeat values of 93, 120, 109 and 94. Hs Troponin values previously peaked at 1224 in 06/2020. Suspect his trend is secondary to demand ischemia as values are not consistent with ACS. - Overall, it is difficult to distinguish if his left arm pain is possibly his anginal equivalent or due to musculoskeletal discomfort as he is unable to elaborate on the history surrounding the events. His daughter does report his pain has improved with nitroglycerin in the past although this did not help the day of admission. Will do a trial of Imdur 30 mg daily to assess for improvement. As discussed during prior hospitalizations, he is not a candidate for invasive evaluation given his multiple comorbidities. He remains on Atorvastatin 40 mg daily, Coreg 3.125 mg twice daily and Plavix 75mg  daily. As discussed below, would anticipate stopping ASA prior to discharge to avoid triple therapy. Would cancel his repeat echo as this was just performed in 06/2020 and a repeat at this time would not change his management strategy.   2. Aortic Stenosis - He has known moderate to severe AS. Not a candidate for  TAVR work-up as previously discussed during prior admission. He is being followed by Palliative Care as an outpatient.   3. PAD - He is s/p bilateral BKA's. Remains on ASA, Plavix and statin therapy. Was only on Plavix and Eliquis prior to admission so once cleared to resume Eliquis, would stop ASA.   4. HFpEF - He denies any recent orthopnea or PND but did have a moderate pleural effusion on CXR at the time of admission. He has been receiving IVF as he was initially NPO and is now on a clear liquid diet due to concerns with aspiration. Would recommend stopping IVF and likely resuming Lasix prior to discharge (listed as being on 80mg  daily prior to admission).   5. Paroxysmal Atrial Fibrillation - Listed as  being on Eliquis 2.5mg  BID prior to admission but not ordered on admission. He did have difficulty swallowing at that time but this has improved. Anticipate restarting Eliquis once cleared to do so from the Hospitalist perspective. - Restart PTA Coreg 3.25 mg twice daily for rate-control.   6. Stage III-IV CKD - His creatinine is at 3.25 this AM which is similar to his known baseline.  7. UTI - Urine culture positive for Klebsiella Pneumoniae. Remains on Meropenem.    For questions or updates, please contact [place] Please consult [email or web address] for contact info under    Signed, Erma Heritage, PA-C  10/13/2020 9:41 AM    Attending note:  Patient seen and examined.  I reviewed his records and discussed the case with Ms. Delano Metz, I agree with her above findings.  Mr. Vosler is currently admitted to the hospital with UTI, recent mental status changes some of which could have been related to medications and also infection, intermittent chest and left arm discomfort as well.  He has very limited functional capacity at baseline, is at home with assistance from family and also palliative care.  He has been managed medically for ischemic heart disease and moderate to severe  aortic stenosis (including hospitalization in June with NSTEMI and high-sensitivity troponin I level at 1224).  On examination he is in no distress.  Currently afebrile.  Heart rate is in the 60s to 70s in sinus rhythm.  Systolic blood pressure in the 130s.  Lungs are clear anteriorly.  Cardiac exam with RRR and 3/6 systolic murmur consistent with aortic stenosis.  Pertinent lab work includes potassium 3.6, BUN 39, creatinine 3.25, peak high-sensitivity troponin I 120, LDL 40, hemoglobin A1c 6.6%, hemoglobin 11.7, platelets 183, SARS coronavirus 2 test negative, urine culture positive for Klebsiella.  Chest x-ray reports increased size of moderate left pleural effusion with associated consolidation.  ECG currently not available for review.  Ischemic heart disease with plan for medical therapy as reviewed above.  Minor increase in high-sensitivity troponin I of 120 most likely reflects demand ischemia rather than frank ACS.  Plan to continue Plavix, Coreg, Lipitor, and add Imdur 30 mg daily for additional antianginal benefit.  No need to repeat echocardiogram at this time with most recent study done in June and plan for conservative management of his aortic stenosis.  He has a history of paroxysmal atrial fibrillation and had also been on Eliquis as an outpatient, presuming this is resumed would stop aspirin.  Satira Sark, M.D., F.A.C.C.

## 2020-10-13 NOTE — Progress Notes (Signed)
PROGRESS NOTE    [name]  YKD:[id number] DOB: 09-04-1927 DOA: 10/11/2020 PCP: Redmond School, MD    Chief Complaint  Patient presents with   Altered Mental Status    Brief admission Narrative:  As per H&P written by Dr. Sherral Hammers on 10/11/2020 [name] is a 87 y.o. WM PMHx DM type II uncontrolled with complication, HLD, s/p bilateral lower extremity BKA, chronic diastolic CHF, moderate to severe aortic stenosis, s/p NSTEMI, HTN, hypothyroidism, OSA (not on CPAP), urinary retention secondary to BPH indwelling Foley catheter.  CKD stage IV   Level 5 caveat secondary to altered mental status.  Patient arrives by EMS who is providing history.  They were called for unresponsiveness.  They said that the patient had complained of chest pain during the night and received 3 nitro and tramadol with improvement.  He also had his Foley changed yesterday.  Patient unable to provide any history.  Per daughter substernal chest pain with radiation to left axilla, positive nausea, positive diaphoresis.  Dr. Mali Hilty is patient's cardiologist.  Per daughter on a daily basis baseline A/O x4 with some mild forgetfulness.  Assessment & Plan: 1-acute metabolic encephalopathy -In the setting of UTI -Continue supportive care -Continue constant reorientation -Maintain adequate hydration and continue antibiotic therapy as mentioned below. -Per family members mentation has improved and close to baseline.  2-ESBL Klebsiella pneumonia I UTI -Patient with chronic indwelling catheter and reason for CAUTI -Indwelling catheter was exchanged prior to admission -Following culture results we will provide treatment with meropenem and -Patient is afebrile -Patient denies dysuria at this time.  3-chest pain/elevated troponin -No acute ischemic changes on telemetry or EKG -With concerns for demand ischemia -Echo has been canceled by cardiology as he just had echocardiogram done in June 2022 and findings will  not change medical management currently. -Continue Plavix and statins; resume the use of carvedilol and add Imdur. -Continue telemetry monitoring and follow clinical response. -Cardiology service consultation appreciated.  4-peripheral vascular disease -Status post bilateral BKA -Continue risk factor modification -Continue Eliquis, statins and Plavix.  5-essential hypertension -Continue amlodipine -Follow vital signs trend.  6-chronic kidney disease a stage IV -Appears to be at baseline -Continue to follow renal function trend. -Creatinine 3.23.  7-type 2 diabetes mellitus with nephropathy -A1c 6.6  -Continue sliding scale insulin -Continue holding oral hypoglycemic agents. -Modify carbohydrate diet initiated.  8-HLD -continue statins  9-hypothyroidism  -continue synthroid   39-JQBHALP diastolic HF -Continue to follow daily weights and strict I's and O's -Currently no signs of fluid overload. -Patient denies shortness of breath or orthopnea. -Will continue to be judicious about volume and hydration. -Planning to resume the use of Lasix at time of discharge.  11-chronic paroxysmal atrial fibrillation -Rate controlled currently -Resume adjusted dose of carvedilol and the use of Eliquis. -Continue telemetry monitoring.  12-moderate aortic stenosis -Systolic murmur present on examination -Continue patient follow-up with cardiology as an outpatient. -Not a candidate for TAVR.   DVT prophylaxis: Heparin Code Status: DNR Family Communication: Daughter at bedside. Disposition:   Status is: Inpatient  Remains inpatient appropriate because:IV treatments appropriate due to intensity of illness or inability to take PO  Dispo: The patient is from: Home              Anticipated d/c is to: Home              Patient currently is not medically stable to d/c.   Difficult to place patient No    Consultants:  Cardiology service  Procedures:  See below for x-ray  reports.  Antimicrobials:  Zosyn  Subjective: No nausea, no vomiting, no abdominal pain.  Patient is currently afebrile.  Reports to be hungry and per family at bedside mentation improving closer to baseline.  Continue intermittently to express left sided chest pain and shoulder discomfort.  Objective: Vitals:   10/12/20 1435 10/12/20 2107 10/13/20 0500 10/13/20 0535  BP: 140/61 139/71  138/71  Pulse: (!) 55 71  68  Resp: 19 17  17   Temp: 98.3 F (36.8 C) 98.2 F (36.8 C)  (!) 97.5 F (36.4 C)  TempSrc: Oral   Oral  SpO2: 95% 98%  92%  Weight:   94.4 kg   Height:        Intake/Output Summary (Last 24 hours) at 10/13/2020 1313 Last data filed at 10/13/2020 0608 Gross per 24 hour  Intake 1205.85 ml  Output 1000 ml  Net 205.85 ml   Filed Weights   10/11/20 1046 10/12/20 0400 10/13/20 0500  Weight: 98 kg 94.3 kg 94.4 kg    Examination: General exam: Alert, awake, oriented x 1; still intermittently complaining of left chest/shoulder pain.  No nausea, no vomiting, reports no shortness of breath. Respiratory system: Good saturation on room air; no tachypnea.  No wheezing or crackles on exam. Cardiovascular system: Rate controlled, no rubs, no gallops. Gastrointestinal system: Abdomen is nondistended, soft and nontender. No organomegaly or masses felt. Normal bowel sounds heard. Central nervous system: Alert and oriented x1; no focal deficit. Extremities: Bilateral BKA; no cyanosis or clubbing. Skin: No petechiae. Psychiatry: Judgement and insight appear impaired secondary to underlying dementia.  Family at bedside expressed mentation improved today and close to his baseline.  Data Reviewed: I have personally reviewed following labs and imaging studies  CBC: Recent Labs  Lab 10/11/20 1050  WBC 16.0*  NEUTROABS 12.5*  HGB 11.7*  HCT 34.9*  MCV 99.7  PLT 370    Basic Metabolic Panel: Recent Labs  Lab 10/11/20 1050 10/12/20 0107 10/13/20 0526  NA 135 138 137  K  3.7 3.5 3.6  CL 101 108 104  CO2 23 23 23   GLUCOSE 138* 60* 158*  BUN 45* 43* 39*  CREATININE 3.31* 3.23* 3.25*  CALCIUM 8.8* 8.2* 8.6*  MG  --  1.9 2.0  PHOS  --  4.3 3.8    GFR: Estimated Creatinine Clearance: 17.4 mL/min (A) (by C-G formula based on SCr of 3.25 mg/dL (H)).  Liver Function Tests: Recent Labs  Lab 10/11/20 1050 10/12/20 0107 10/13/20 0526  AST 20 18 90*  ALT 16 13 19   ALKPHOS 45 29* 27*  BILITOT 0.6 0.6 0.5  PROT 7.7 6.3* 6.8  ALBUMIN 3.5 2.9* 3.0*   CBG: Recent Labs  Lab 10/13/20 0105  GLUCAP 164*    Recent Results (from the past 240 hour(s))  Urine Culture     Status: Abnormal   Collection Time: 10/11/20 10:50 AM   Specimen: Urine, Catheterized  Result Value Ref Range Status   Specimen Description   Final    URINE, CATHETERIZED Performed at [facility], [address] [place], [place] [address]    Special Requests   Final    NONE Performed at [facility], [address] [place], [place] [address]    Culture (A)  Final    >=100,000 COLONIES/mL KLEBSIELLA PNEUMONIAE Confirmed Extended Spectrum Beta-Lactamase Producer (ESBL).  In bloodstream infections from ESBL organisms, carbapenems are preferred over piperacillin/tazobactam. They are shown to have a  lower risk of mortality.    Report Status 10/13/2020 FINAL  Final   Organism ID, Bacteria KLEBSIELLA PNEUMONIAE (A)  Final      Susceptibility   Klebsiella pneumoniae - MIC*    AMPICILLIN >=32 RESISTANT Resistant     CEFAZOLIN >=64 RESISTANT Resistant     CEFEPIME >=32 RESISTANT Resistant     CEFTRIAXONE >=64 RESISTANT Resistant     CIPROFLOXACIN >=4 RESISTANT Resistant     GENTAMICIN >=16 RESISTANT Resistant     IMIPENEM <=0.25 SENSITIVE Sensitive     NITROFURANTOIN 256 RESISTANT Resistant     TRIMETH/SULFA >=320 RESISTANT Resistant     AMPICILLIN/SULBACTAM >=32 RESISTANT Resistant     PIP/TAZO 64 INTERMEDIATE Intermediate     * >=100,000 COLONIES/mL KLEBSIELLA PNEUMONIAE  Culture,  blood (routine x 2)     Status: None (Preliminary result)   Collection Time: 10/11/20 10:56 AM   Specimen: Left Antecubital; Blood  Result Value Ref Range Status   Specimen Description LEFT ANTECUBITAL  Final   Special Requests   Final    BOTTLES DRAWN AEROBIC AND ANAEROBIC Blood Culture adequate volume   Culture   Final    NO GROWTH 2 DAYS Performed at [facility], [address] [place], [place] [address]    Report Status PENDING  Incomplete  Culture, blood (routine x 2)     Status: None (Preliminary result)   Collection Time: 10/11/20 11:01 AM   Specimen: Right Antecubital; Blood  Result Value Ref Range Status   Specimen Description RIGHT ANTECUBITAL  Final   Special Requests   Final    BOTTLES DRAWN AEROBIC AND ANAEROBIC Blood Culture results may not be optimal due to an excessive volume of blood received in culture bottles   Culture   Final    NO GROWTH 2 DAYS Performed at [facility], [address] [place], [place] [address]    Report Status PENDING  Incomplete  Urine Culture     Status: Abnormal   Collection Time: 10/11/20 12:15 PM   Specimen: Urine, Catheterized  Result Value Ref Range Status   Specimen Description   Final    URINE, CATHETERIZED Performed at [facility], [address], [place], [place] [address]    Special Requests   Final    NONE Performed at [facility], [address], [place], [place] [address]    Culture (A)  Final    >=100,000 COLONIES/mL KLEBSIELLA PNEUMONIAE Confirmed Extended Spectrum Beta-Lactamase Producer (ESBL).  In bloodstream infections from ESBL organisms, carbapenems are preferred over piperacillin/tazobactam. They are shown to have a lower risk of mortality.    Report Status 10/13/2020 FINAL  Final   Organism ID, Bacteria KLEBSIELLA PNEUMONIAE (A)  Final      Susceptibility   Klebsiella pneumoniae - MIC*    AMPICILLIN >=32 RESISTANT Resistant     CEFAZOLIN >=64 RESISTANT Resistant     CEFEPIME >=32 RESISTANT Resistant      CEFTRIAXONE >=64 RESISTANT Resistant     CIPROFLOXACIN >=4 RESISTANT Resistant     GENTAMICIN >=16 RESISTANT Resistant     IMIPENEM <=0.25 SENSITIVE Sensitive     NITROFURANTOIN 128 RESISTANT Resistant     TRIMETH/SULFA >=320 RESISTANT Resistant     AMPICILLIN/SULBACTAM >=32 RESISTANT Resistant     PIP/TAZO 64 INTERMEDIATE Intermediate     * >=100,000 COLONIES/mL KLEBSIELLA PNEUMONIAE  Resp Panel by RT-PCR (Flu A&B, Covid) Urine, Catheterized     Status: None   Collection Time: 10/11/20 12:15 PM   Specimen: Urine,  Catheterized; Nasopharyngeal(NP) swabs in vial transport medium  Result Value Ref Range Status   SARS Coronavirus 2 by RT PCR NEGATIVE NEGATIVE Final    Comment: (NOTE) SARS-CoV-2 target nucleic acids are NOT DETECTED.  The SARS-CoV-2 RNA is generally detectable in upper respiratory specimens during the acute phase of infection. The lowest concentration of SARS-CoV-2 viral copies this assay can detect is 138 copies/mL. A negative result does not preclude SARS-Cov-2 infection and should not be used as the sole basis for treatment or other patient management decisions. A negative result may occur with  improper specimen collection/handling, submission of specimen other than nasopharyngeal swab, presence of viral mutation(s) within the areas targeted by this assay, and inadequate number of viral copies(<138 copies/mL). A negative result must be combined with clinical observations, patient history, and epidemiological information. The expected result is Negative.  Fact Sheet for Patients:  EntrepreneurPulse.com.au  Fact Sheet for Healthcare Providers:  IncredibleEmployment.be  This test is no t yet approved or cleared by the [place] FDA and  has been authorized for detection and/or diagnosis of SARS-CoV-2 by FDA under an Emergency Use Authorization (EUA). This EUA will remain  in effect (meaning this test can be used) for the  duration of the COVID-19 declaration under Section 564(b)(1) of the Act, 21 U.S.C.section 360bbb-3(b)(1), unless the authorization is terminated  or revoked sooner.       Influenza A by PCR NEGATIVE NEGATIVE Final   Influenza B by PCR NEGATIVE NEGATIVE Final    Comment: (NOTE) The Xpert Xpress SARS-CoV-2/FLU/RSV plus assay is intended as an aid in the diagnosis of influenza from Nasopharyngeal swab specimens and should not be used as a sole basis for treatment. Nasal washings and aspirates are unacceptable for Xpert Xpress SARS-CoV-2/FLU/RSV testing.  Fact Sheet for Patients: EntrepreneurPulse.com.au  Fact Sheet for Healthcare Providers: IncredibleEmployment.be  This test is not yet approved or cleared by the [place] FDA and has been authorized for detection and/or diagnosis of SARS-CoV-2 by FDA under an Emergency Use Authorization (EUA). This EUA will remain in effect (meaning this test can be used) for the duration of the COVID-19 declaration under Section 564(b)(1) of the Act, 21 U.S.C. section 360bbb-3(b)(1), unless the authorization is terminated or revoked.  Performed at [facility], [address] [place], [place] [address]   Culture, blood (routine x 2)     Status: None (Preliminary result)   Collection Time: 10/11/20  2:59 PM   Specimen: BLOOD LEFT HAND  Result Value Ref Range Status   Specimen Description BLOOD LEFT HAND  Final   Special Requests   Final    Blood Culture results may not be optimal due to an excessive volume of blood received in culture bottles BOTTLES DRAWN AEROBIC AND ANAEROBIC   Culture   Final    NO GROWTH 2 DAYS Performed at [facility], [address], [place], [place] [address]    Report Status PENDING  Incomplete  Culture, blood (routine x 2)     Status: None (Preliminary result)   Collection Time: 10/11/20  2:59 PM   Specimen: BLOOD RIGHT ARM  Result Value Ref Range Status   Specimen  Description BLOOD RIGHT ARM  Final   Special Requests   Final    Blood Culture results may not be optimal due to an excessive volume of blood received in culture bottles BOTTLES DRAWN AEROBIC AND ANAEROBIC   Culture   Final    NO GROWTH 2 DAYS Performed at [facility], [address] [place], [place] [address]    Report Status PENDING  Incomplete    Radiology Studies: No results found.  Scheduled Meds:  amLODipine  5 mg Oral Daily   apixaban  2.5 mg Oral BID   atorvastatin  40 mg Oral Daily   carvedilol  3.125 mg Oral BID WC   Chlorhexidine Gluconate Cloth  6 each Topical Daily   clopidogrel  75 mg Oral Daily   doxazosin  2 mg Oral Daily   insulin aspart  0-9 Units Subcutaneous TID WC   isosorbide mononitrate  30 mg Oral Daily   levothyroxine  75 mcg Oral QAC breakfast   pantoprazole  40 mg Oral Daily   Continuous Infusions:  meropenem (MERREM) IV 1 g (10/13/20 1115)   methocarbamol (ROBAXIN) IV 500 mg (10/12/20 1404)     LOS: 2 days    Time spent: 30 minutes.    Barton Dubois, MD [facility]   To contact the attending provider between 7A-7P or the covering provider during after hours 7P-7A, please log into the web site [email or web address] and access using universal [place] password for that web site. If you do not have the password, please call the hospital operator.  10/13/2020, 1:13 PM

## 2020-10-13 NOTE — Care Management Important Message (Signed)
Important Message  Patient Details  Name: [name] MRN: [id number] Date of Birth: 04/14/1927   Medicare Important Message Given:  Yes (spoke with daughter [name], no copy needed.)     [name] 10/13/2020, 12:03 PM

## 2020-10-13 NOTE — Progress Notes (Signed)
Pharmacy Antibiotic Note  [name] is a 87 y.o. male admitted on 10/11/2020 with ESBL K. Pneumo UTI.  Pharmacy has been consulted for Merrem dosing.  Plan: Merrem 1gm IV q12h F/U cxs and clinical progress Monitor V/S, labs  Height: 5\' 11"  (180.3 cm) Weight: 94.4 kg (208 lb 1.8 oz) IBW/kg (Calculated) : 75.3  Temp (24hrs), Avg:98 F (36.7 C), Min:97.5 F (36.4 C), Max:98.3 F (36.8 C)  Recent Labs  Lab 10/11/20 1050 10/11/20 1915 10/12/20 0107 10/13/20 0526  WBC 16.0*  --   --   --   CREATININE 3.31*  --  3.23* 3.25*  LATICACIDVEN 1.2 1.0  --   --     Estimated Creatinine Clearance: 17.4 mL/min (A) (by C-G formula based on SCr of 3.25 mg/dL (H)).    Allergies  Allergen Reactions   Iodinated Diagnostic Agents Other (See Comments)    caused Fever   Dye Fdc Red [Red Dye] Hives    Tolerated red Docusate, Niferex   Hydralazine     Antimicrobials this admission: merrem 9/30 >>  zosyn 9/29>>  Microbiology results:  9/28 BCX: ngtd 9/28 UCX: >100K CFU/ml ESBL K. Pneumo s- imipenem   Thank you for allowing pharmacy to be a part of this patient's care.  Isac Sarna, BS Pharm D, [place] Clinical Pharmacist Pager [phone] 10/13/2020 10:43 AM

## 2020-10-13 NOTE — Care Management Important Message (Deleted)
Important Message  Patient Details  Name: [name] MRN: [id number] Date of Birth: November 04, 1927   Medicare Important Message Given:  Yes     [name] 10/13/2020, 11:58 AM

## 2020-10-14 LAB — GLUCOSE, CAPILLARY
Glucose-Capillary: 119 mg/dL — ABNORMAL HIGH (ref 70–99)
Glucose-Capillary: 165 mg/dL — ABNORMAL HIGH (ref 70–99)
Glucose-Capillary: 144 mg/dL — ABNORMAL HIGH (ref 70–99)
Glucose-Capillary: 152 mg/dL — ABNORMAL HIGH (ref 70–99)

## 2020-10-14 LAB — COMPREHENSIVE METABOLIC PANEL
ALT: 20 U/L (ref 0–44)
AST: 90 U/L — ABNORMAL HIGH (ref 15–41)
Albumin: 2.9 g/dL — ABNORMAL LOW (ref 3.5–5.0)
Alkaline Phosphatase: 27 U/L — ABNORMAL LOW (ref 38–126)
Anion gap: 9 (ref 5–15)
BUN: 38 mg/dL — ABNORMAL HIGH (ref 8–23)
CO2: 22 mmol/L (ref 22–32)
Calcium: 8.2 mg/dL — ABNORMAL LOW (ref 8.9–10.3)
Chloride: 104 mmol/L (ref 98–111)
Creatinine, Ser: 3.46 mg/dL — ABNORMAL HIGH (ref 0.61–1.24)
GFR, Estimated: 16 mL/min — ABNORMAL LOW (ref >60)
Glucose, Bld: 129 mg/dL — ABNORMAL HIGH (ref 70–99)
Potassium: 3.5 mmol/L (ref 3.5–5.1)
Sodium: 135 mmol/L (ref 135–145)
Total Bilirubin: 0.9 mg/dL (ref 0.3–1.2)
Total Protein: 6.4 g/dL — ABNORMAL LOW (ref 6.5–8.1)

## 2020-10-14 LAB — MAGNESIUM: Magnesium: 1.9 mg/dL (ref 1.7–2.4)

## 2020-10-14 LAB — PHOSPHORUS: Phosphorus: 3.7 mg/dL (ref 2.5–4.6)

## 2020-10-14 NOTE — Progress Notes (Signed)
PROGRESS NOTE    [name]  GEX:[id number] DOB: [birth year] DOA: 10/11/2020 PCP: Redmond School, MD    Chief Complaint  Patient presents with   Altered Mental Status    Brief admission Narrative:  As per H&P written by Dr. Sherral Hammers on 10/11/2020 [name] is a 86 y.o. WM PMHx DM type II uncontrolled with complication, HLD, s/p bilateral lower extremity BKA, chronic diastolic CHF, moderate to severe aortic stenosis, s/p NSTEMI, HTN, hypothyroidism, OSA (not on CPAP), urinary retention secondary to BPH indwelling Foley catheter.  CKD stage IV   Level 5 caveat secondary to altered mental status.  Patient arrives by EMS who is providing history.  They were called for unresponsiveness.  They said that the patient had complained of chest pain during the night and received 3 nitro and tramadol with improvement.  He also had his Foley changed yesterday.  Patient unable to provide any history.  Per daughter substernal chest pain with radiation to left axilla, positive nausea, positive diaphoresis.  Dr. Mali Hilty is patient's cardiologist.  Per daughter on a daily basis baseline A/O x4 with some mild forgetfulness.  Assessment & Plan: 1-acute metabolic encephalopathy -In the setting of UTI -Continue supportive care -Continue constant reorientation -Maintain adequate hydration and continue antibiotic therapy as mentioned below. -Per family members mentation has improved and close to baseline.  2-ESBL Klebsiella pneumonia I UTI -Patient with chronic indwelling catheter and reason for CAUTI -Indwelling catheter was exchanged prior to admission -Following culture results we will provide treatment with meropenem and -Patient is afebrile -Patient denies dysuria at this time.  3-chest pain/elevated troponin -No acute ischemic changes on telemetry or EKG -With concerns for demand ischemia -Echo has been canceled by cardiology as he just had echocardiogram done in June 2022 and findings will  not change medical management currently. -Continue Plavix and statins; resume the use of carvedilol and add Imdur. -Continue telemetry monitoring and follow clinical response. -Cardiology service consultation appreciated.  4-peripheral vascular disease -Status post bilateral BKA -Continue risk factor modification -Continue Eliquis, statins and Plavix.  5-essential hypertension -Continue amlodipine -Follow vital signs trend.  6-chronic kidney disease a stage IV -Appears to be at baseline -Continue to follow renal function trend. -Creatinine 3.23.  7-type 2 diabetes mellitus with nephropathy -A1c 6.6  -Continue sliding scale insulin -Continue holding oral hypoglycemic agents. -Modify carbohydrate diet initiated.  8-HLD -continue statins  9-hypothyroidism  -continue synthroid   01-UUVOZDG diastolic HF -Continue to follow daily weights and strict I's and O's -Currently no signs of fluid overload. -Patient denies shortness of breath or orthopnea. -Will continue to be judicious about volume and hydration. -Planning to resume the use of Lasix at time of discharge.  11-chronic paroxysmal atrial fibrillation -Rate controlled currently -Resume adjusted dose of carvedilol and the use of Eliquis. -Continue telemetry monitoring.  12-moderate aortic stenosis -Systolic murmur present on examination -Continue patient follow-up with cardiology as an outpatient. -Not a candidate for TAVR.   DVT prophylaxis: Heparin Code Status: DNR Family Communication: Mentation back to baseline.. Disposition:   Status is: Inpatient  Remains inpatient appropriate because:IV treatments appropriate due to intensity of illness or inability to take PO  Dispo: The patient is from: Home              Anticipated d/c is to: Home              Patient currently is not medically stable to d/c.   Difficult to place patient No  Consultants:  Cardiology service  Procedures:  See below for x-ray  reports.  Antimicrobials:  Zosyn  Subjective: No chest pain, no nausea, no vomiting.  Patient continue reporting intermittent shoulder pain (but improved); mentation is back to baseline.  Afebrile  Objective: Vitals:   10/13/20 0535 10/13/20 2031 10/14/20 0508 10/14/20 0858  BP: 138/71 123/61 136/71 138/81  Pulse: 68 69 79 87  Resp: 17 16 18    Temp: (!) 97.5 F (36.4 C) 98 F (36.7 C) 98.9 F (37.2 C)   TempSrc: Oral Oral Oral   SpO2: 92% 94% 94%   Weight:   94.9 kg   Height:        Intake/Output Summary (Last 24 hours) at 10/14/2020 1143 Last data filed at 10/14/2020 0500 Gross per 24 hour  Intake 940.87 ml  Output 900 ml  Net 40.87 ml   Filed Weights   10/12/20 0400 10/13/20 0500 10/14/20 0508  Weight: 94.3 kg 94.4 kg 94.9 kg    Examination: General exam: Alert, following commands appropriately; no chest pain, no nausea, no vomiting.  Still complaining of intermittent left shoulder pain. Per daughter at bedside, his mentation is back to baseline.  Respiratory system: No wheezing, no crackles, no using accessory muscle.  Good saturation on room air. Cardiovascular system: Rate controlled, no rubs, no gallops, no JVD on exam. Gastrointestinal system: Abdomen is nondistended, soft and nontender. No organomegaly or masses felt. Normal bowel sounds heard. Central nervous system: Alert and oriented. No focal neurological deficits. Extremities: No edema; bilateral BKA. Skin: No petechiae. Psychiatry: Mood & affect appropriate.   Data Reviewed: I have personally reviewed following labs and imaging studies  CBC: Recent Labs  Lab 10/11/20 1050  WBC 16.0*  NEUTROABS 12.5*  HGB 11.7*  HCT 34.9*  MCV 99.7  PLT 606    Basic Metabolic Panel: Recent Labs  Lab 10/11/20 1050 10/12/20 0107 10/13/20 0526 10/14/20 0523  NA 135 138 137 135  K 3.7 3.5 3.6 3.5  CL 101 108 104 104  CO2 23 23 23 22   GLUCOSE 138* 60* 158* 129*  BUN 45* 43* 39* 38*  CREATININE 3.31* 3.23*  3.25* 3.46*  CALCIUM 8.8* 8.2* 8.6* 8.2*  MG  --  1.9 2.0 1.9  PHOS  --  4.3 3.8 3.7    GFR: Estimated Creatinine Clearance: 16.3 mL/min (A) (by C-G formula based on SCr of 3.46 mg/dL (H)).  Liver Function Tests: Recent Labs  Lab 10/11/20 1050 10/12/20 0107 10/13/20 0526 10/14/20 0523  AST 20 18 90* 90*  ALT 16 13 19 20   ALKPHOS 45 29* 27* 27*  BILITOT 0.6 0.6 0.5 0.9  PROT 7.7 6.3* 6.8 6.4*  ALBUMIN 3.5 2.9* 3.0* 2.9*   CBG: Recent Labs  Lab 10/13/20 0105 10/13/20 1728 10/13/20 1941 10/14/20 0740 10/14/20 1115  GLUCAP 164* 153* 182* 119* 165*    Recent Results (from the past 240 hour(s))  Urine Culture     Status: Abnormal   Collection Time: 10/11/20 10:50 AM   Specimen: Urine, Catheterized  Result Value Ref Range Status   Specimen Description   Final    URINE, CATHETERIZED Performed at [facility], [address] [place], [place] [address]    Special Requests   Final    NONE Performed at [facility], [address], [place], [place] [address]    Culture (A)  Final    >=100,000 COLONIES/mL KLEBSIELLA PNEUMONIAE Confirmed Extended Spectrum Beta-Lactamase Producer (ESBL).  In bloodstream infections from ESBL organisms, carbapenems  are preferred over piperacillin/tazobactam. They are shown to have a lower risk of mortality.    Report Status 10/13/2020 FINAL  Final   Organism ID, Bacteria KLEBSIELLA PNEUMONIAE (A)  Final      Susceptibility   Klebsiella pneumoniae - MIC*    AMPICILLIN >=32 RESISTANT Resistant     CEFAZOLIN >=64 RESISTANT Resistant     CEFEPIME >=32 RESISTANT Resistant     CEFTRIAXONE >=64 RESISTANT Resistant     CIPROFLOXACIN >=4 RESISTANT Resistant     GENTAMICIN >=16 RESISTANT Resistant     IMIPENEM <=0.25 SENSITIVE Sensitive     NITROFURANTOIN 256 RESISTANT Resistant     TRIMETH/SULFA >=320 RESISTANT Resistant     AMPICILLIN/SULBACTAM >=32 RESISTANT Resistant     PIP/TAZO 64 INTERMEDIATE Intermediate     * >=100,000 COLONIES/mL  KLEBSIELLA PNEUMONIAE  Culture, blood (routine x 2)     Status: None (Preliminary result)   Collection Time: 10/11/20 10:56 AM   Specimen: Left Antecubital; Blood  Result Value Ref Range Status   Specimen Description LEFT ANTECUBITAL  Final   Special Requests   Final    BOTTLES DRAWN AEROBIC AND ANAEROBIC Blood Culture adequate volume   Culture   Final    NO GROWTH 3 DAYS Performed at [facility], [address] [place], [place] [address]    Report Status PENDING  Incomplete  Culture, blood (routine x 2)     Status: None (Preliminary result)   Collection Time: 10/11/20 11:01 AM   Specimen: Right Antecubital; Blood  Result Value Ref Range Status   Specimen Description RIGHT ANTECUBITAL  Final   Special Requests   Final    BOTTLES DRAWN AEROBIC AND ANAEROBIC Blood Culture results may not be optimal due to an excessive volume of blood received in culture bottles   Culture   Final    NO GROWTH 3 DAYS Performed at [facility], [address], [place], [place] [address]    Report Status PENDING  Incomplete  Urine Culture     Status: Abnormal   Collection Time: 10/11/20 12:15 PM   Specimen: Urine, Catheterized  Result Value Ref Range Status   Specimen Description   Final    URINE, CATHETERIZED Performed at [facility], [address], [place], [place] [address]    Special Requests   Final    NONE Performed at [facility], [address], [place], [place] [address]    Culture (A)  Final    >=100,000 COLONIES/mL KLEBSIELLA PNEUMONIAE Confirmed Extended Spectrum Beta-Lactamase Producer (ESBL).  In bloodstream infections from ESBL organisms, carbapenems are preferred over piperacillin/tazobactam. They are shown to have a lower risk of mortality.    Report Status 10/13/2020 FINAL  Final   Organism ID, Bacteria KLEBSIELLA PNEUMONIAE (A)  Final      Susceptibility   Klebsiella pneumoniae - MIC*    AMPICILLIN >=32 RESISTANT Resistant     CEFAZOLIN >=64 RESISTANT Resistant      CEFEPIME >=32 RESISTANT Resistant     CEFTRIAXONE >=64 RESISTANT Resistant     CIPROFLOXACIN >=4 RESISTANT Resistant     GENTAMICIN >=16 RESISTANT Resistant     IMIPENEM <=0.25 SENSITIVE Sensitive     NITROFURANTOIN 128 RESISTANT Resistant     TRIMETH/SULFA >=320 RESISTANT Resistant     AMPICILLIN/SULBACTAM >=32 RESISTANT Resistant     PIP/TAZO 64 INTERMEDIATE Intermediate     * >=100,000 COLONIES/mL KLEBSIELLA PNEUMONIAE  Resp Panel by RT-PCR (Flu A&B, Covid) Urine, Catheterized     Status: None  Collection Time: 10/11/20 12:15 PM   Specimen: Urine, Catheterized; Nasopharyngeal(NP) swabs in vial transport medium  Result Value Ref Range Status   SARS Coronavirus 2 by RT PCR NEGATIVE NEGATIVE Final    Comment: (NOTE) SARS-CoV-2 target nucleic acids are NOT DETECTED.  The SARS-CoV-2 RNA is generally detectable in upper respiratory specimens during the acute phase of infection. The lowest concentration of SARS-CoV-2 viral copies this assay can detect is 138 copies/mL. A negative result does not preclude SARS-Cov-2 infection and should not be used as the sole basis for treatment or other patient management decisions. A negative result may occur with  improper specimen collection/handling, submission of specimen other than nasopharyngeal swab, presence of viral mutation(s) within the areas targeted by this assay, and inadequate number of viral copies(<138 copies/mL). A negative result must be combined with clinical observations, patient history, and epidemiological information. The expected result is Negative.  Fact Sheet for Patients:  EntrepreneurPulse.com.au  Fact Sheet for Healthcare Providers:  IncredibleEmployment.be  This test is no t yet approved or cleared by the [place] FDA and  has been authorized for detection and/or diagnosis of SARS-CoV-2 by FDA under an Emergency Use Authorization (EUA). This EUA will remain  in effect  (meaning this test can be used) for the duration of the COVID-19 declaration under Section 564(b)(1) of the Act, 21 U.S.C.section 360bbb-3(b)(1), unless the authorization is terminated  or revoked sooner.       Influenza A by PCR NEGATIVE NEGATIVE Final   Influenza B by PCR NEGATIVE NEGATIVE Final    Comment: (NOTE) The Xpert Xpress SARS-CoV-2/FLU/RSV plus assay is intended as an aid in the diagnosis of influenza from Nasopharyngeal swab specimens and should not be used as a sole basis for treatment. Nasal washings and aspirates are unacceptable for Xpert Xpress SARS-CoV-2/FLU/RSV testing.  Fact Sheet for Patients: EntrepreneurPulse.com.au  Fact Sheet for Healthcare Providers: IncredibleEmployment.be  This test is not yet approved or cleared by the [place] FDA and has been authorized for detection and/or diagnosis of SARS-CoV-2 by FDA under an Emergency Use Authorization (EUA). This EUA will remain in effect (meaning this test can be used) for the duration of the COVID-19 declaration under Section 564(b)(1) of the Act, 21 U.S.C. section 360bbb-3(b)(1), unless the authorization is terminated or revoked.  Performed at [facility], [address] [place], [place] [address]   Culture, blood (routine x 2)     Status: None (Preliminary result)   Collection Time: 10/11/20  2:59 PM   Specimen: BLOOD LEFT HAND  Result Value Ref Range Status   Specimen Description BLOOD LEFT HAND  Final   Special Requests   Final    Blood Culture results may not be optimal due to an excessive volume of blood received in culture bottles BOTTLES DRAWN AEROBIC AND ANAEROBIC   Culture   Final    NO GROWTH 3 DAYS Performed at [facility], [address] [place], [place] [address]    Report Status PENDING  Incomplete  Culture, blood (routine x 2)     Status: None (Preliminary result)   Collection Time: 10/11/20  2:59 PM   Specimen: BLOOD RIGHT ARM  Result  Value Ref Range Status   Specimen Description BLOOD RIGHT ARM  Final   Special Requests   Final    Blood Culture results may not be optimal due to an excessive volume of blood received in culture bottles BOTTLES DRAWN AEROBIC AND ANAEROBIC   Culture   Final    NO  GROWTH 3 DAYS Performed at [facility], [address] [place], [place] [address]    Report Status PENDING  Incomplete    Radiology Studies: No results found.  Scheduled Meds:  amLODipine  5 mg Oral Daily   apixaban  2.5 mg Oral BID   atorvastatin  40 mg Oral Daily   carvedilol  3.125 mg Oral BID WC   Chlorhexidine Gluconate Cloth  6 each Topical Daily   clopidogrel  75 mg Oral Daily   doxazosin  2 mg Oral Daily   insulin aspart  0-9 Units Subcutaneous TID WC   isosorbide mononitrate  30 mg Oral Daily   levothyroxine  75 mcg Oral QAC breakfast   pantoprazole  40 mg Oral Daily   Continuous Infusions:  meropenem (MERREM) IV 1 g (10/14/20 0902)   methocarbamol (ROBAXIN) IV Stopped (10/13/20 2115)     LOS: 3 days    Time spent: 30 minutes.    Barton Dubois, MD [facility]   To contact the attending provider between 7A-7P or the covering provider during after hours 7P-7A, please log into the web site [email or web address] and access using universal [place] password for that web site. If you do not have the password, please call the hospital operator.  10/14/2020, 11:43 AM

## 2020-10-15 LAB — MAGNESIUM: Magnesium: 1.9 mg/dL (ref 1.7–2.4)

## 2020-10-15 LAB — COMPREHENSIVE METABOLIC PANEL
ALT: 18 U/L (ref 0–44)
AST: 57 U/L — ABNORMAL HIGH (ref 15–41)
Albumin: 2.8 g/dL — ABNORMAL LOW (ref 3.5–5.0)
Alkaline Phosphatase: 30 U/L — ABNORMAL LOW (ref 38–126)
Anion gap: 9 (ref 5–15)
BUN: 43 mg/dL — ABNORMAL HIGH (ref 8–23)
CO2: 20 mmol/L — ABNORMAL LOW (ref 22–32)
Calcium: 8.2 mg/dL — ABNORMAL LOW (ref 8.9–10.3)
Chloride: 103 mmol/L (ref 98–111)
Creatinine, Ser: 3.65 mg/dL — ABNORMAL HIGH (ref 0.61–1.24)
GFR, Estimated: 15 mL/min — ABNORMAL LOW (ref >60)
Glucose, Bld: 120 mg/dL — ABNORMAL HIGH (ref 70–99)
Potassium: 3.6 mmol/L (ref 3.5–5.1)
Sodium: 132 mmol/L — ABNORMAL LOW (ref 135–145)
Total Bilirubin: 0.8 mg/dL (ref 0.3–1.2)
Total Protein: 6.5 g/dL (ref 6.5–8.1)

## 2020-10-15 LAB — GLUCOSE, CAPILLARY
Glucose-Capillary: 119 mg/dL — ABNORMAL HIGH (ref 70–99)
Glucose-Capillary: 148 mg/dL — ABNORMAL HIGH (ref 70–99)

## 2020-10-15 LAB — PHOSPHORUS: Phosphorus: 4.3 mg/dL (ref 2.5–4.6)

## 2020-10-15 MED ORDER — ISOSORBIDE MONONITRATE ER 30 MG PO TB24: 30.0000 mg | ORAL_TABLET | Freq: Every day | ORAL | 1 refills | Status: DC

## 2020-10-15 MED ORDER — GLIPIZIDE 10 MG PO TABS: 10.0000 mg | ORAL_TABLET | Freq: Every day | ORAL | Status: DC

## 2020-10-15 MED ORDER — FOSFOMYCIN TROMETHAMINE 3 G PO PACK: 3.0000 g | PACK | Freq: Once | ORAL | 0 refills | Status: AC

## 2020-10-15 NOTE — TOC Transition Note (Signed)
Transition of Care [facility]) - CM/SW Discharge Note   Patient Details  Name: [name] MRN: [id number] Date of Birth: 1927-09-19  Transition of Care [facility]) CM/SW Contact:  Iona Beard, [place] Phone Number: 10/15/2020, 1:02 PM   Clinical Narrative:    CSW updated of pts readiness for discharge. CSW notes that pt is active with [facility] for RN services. CSW requested that [facility] orders. CSW to update [facility] agency of pts D/C. TOC signing off.   Final next level of care: [place] Barriers to Discharge: Barriers Resolved   Patient Goals and CMS Choice Patient states their goals for this hospitalization and ongoing recovery are:: Home with [facility] CMS Medicare.gov Compare Post Acute Care list provided to:: Patient Choice offered to / list presented to : Patient  Discharge Placement                       Discharge Plan and Services In-house Referral: Clinical Social Work   Post Acute Care Choice: Resumption of Svcs/PTA Provider                    HH Arranged: RN [place] Agency: [place] ([facility])        Social Determinants of Health (SDOH) Interventions     Readmission Risk Interventions Readmission Risk Prevention Plan 06/19/2020 02/25/2020 12/13/2019  Transportation Screening Complete Complete -  PCP or Specialist Appt within 5-7 Days Complete - -  PCP or Specialist Appt within 3-5 Days - - -  Not Complete comments - - -  Home Care Screening Complete - -  Medication Review (RN CM) Complete - -  HRI or [place] or Home Care Consult comments - - -  Social Work Consult for [place] Planning/Counseling - Complete -  Palliative Care Screening - Complete -  Medication Review Press photographer) - Complete -  PCP or Specialist appointment within 3-5 days of discharge - - Complete  HRI or [place] recent data might be hidden

## 2020-10-15 NOTE — Discharge Summary (Signed)
Physician Discharge Summary  [name] VEH:[id number] DOB: [birth year] DOA: 10/11/2020  PCP: Redmond School, MD  Admit date: 10/11/2020 Discharge date: 10/15/2020  Time spent: 35 minutes  Recommendations for Outpatient Follow-up:  Repeat basic metabolic panel to follow twice renal function Patient to follow-up with cardiology service Reassessed blood pressure and further adjust antihypertensive treatment as needed Continue close monitoring of patient's CBGs/A1c with further adjustment to hypoglycemia regimen as required.  Discharge Diagnoses:  Active Problems:   Peripheral vascular disease (HCC)   Essential hypertension, benign   Hyperlipidemia   Moderate aortic stenosis   Hypothyroidism   Chronic diastolic CHF (congestive heart failure) (HCC)   Atrial fibrillation, chronic   Type 2 diabetes mellitus with vascular disease (HCC)   Chronic kidney disease (CKD), stage IV (severe) ([place])   Essential hypertension   Urinary retention due to benign prostatic hyperplasia   S/P bilateral BKA (below knee amputation) (HCC)   Abnormal urinalysis   Acute metabolic encephalopathy   AMS (altered mental status)   Uncontrolled type 2 diabetes mellitus with hyperglycemia, without long-term current use of insulin ([place])   Acute UTI   Demand ischemia ([place])   Discharge Condition: Stable and improved.  Discharged home with instructions to follow-up with PCP and cardiology as an outpatient.  CODE STATUS: DNR  Diet recommendation: Heart healthy modified carbohydrate diet.  Filed Weights   10/13/20 0500 10/14/20 0508 10/15/20 0530  Weight: 94.4 kg 94.9 kg 97 kg    History of present illness:  As per H&P written by Dr. Sherral Hammers on 10/11/2020 [name] is a 87 y.o. WM PMHx DM type II uncontrolled with complication, HLD, s/p bilateral lower extremity BKA, chronic diastolic CHF, moderate to severe aortic stenosis, s/p NSTEMI, HTN, hypothyroidism, OSA (not on CPAP), urinary retention secondary to  BPH indwelling Foley catheter.  CKD stage IV   Level 5 caveat secondary to altered mental status.  Patient arrives by EMS who is providing history.  They were called for unresponsiveness.  They said that the patient had complained of chest pain during the night and received 3 nitro and tramadol with improvement.  He also had his Foley changed yesterday.  Patient unable to provide any history.  Per daughter substernal chest pain with radiation to left axilla, positive nausea, positive diaphoresis.  Dr. Mali Hilty is patient's cardiologist.  Per daughter on a daily basis baseline A/O x4 with some mild forgetfulness.  Hospital Course:  1-acute metabolic encephalopathy -In the setting of UTI -Continue to maintain adequate hydration. -patient instructed to maintain adequate hydration and continue antibiotic therapy as mentioned below. -Per family members mentation has improved and back to  baseline.   2-ESBL Klebsiella pneumonia I UTI -Patient with chronic indwelling catheter and reason for CAUTI -Indwelling catheter was exchanged prior to admission -Following culture results patient treated with 5 days of IV  meropenem and one dose fosfomycin. -Patient denies dysuria at this time.   3-chest pain/elevated troponin -No acute ischemic changes on telemetry or EKG -With concerns for demand ischemia -Echo has note been repeated, as per cardiology rec's, as he just had echocardiogram done in June 2022 and findings will not change medical management currently. -Continue Plavix and statins; resume the use of carvedilol and add Imdur. -Continue telemetry monitoring and follow clinical response. -Cardiology service consultation appreciated.   4-peripheral vascular disease -Status post bilateral BKA -Continue risk factor modification -Continue Eliquis, statins and Plavix.   5-essential hypertension -Continue amlodipine, Lasix, Imdur and adjust the dose of Coreg. -  Reassess blood pressure at follow-up  visit and further adjust antihypertensive treatment as needed.   6-chronic kidney disease a stage IV -Appears to be at baseline -Continue to follow renal function trend with repeat basic metabolic panel at follow-up visit.. -Creatinine 3.23.   7-type 2 diabetes mellitus with nephropathy -A1c 6.6  -Continue modify carbohydrate diet -Resume home hypoglycemic regimen.  8-HLD -continue statins -Heart healthy diet encouraged.   9-hypothyroidism  -continue synthroid    16-XWRUEAV diastolic HF -Continue to follow daily weights and low-sodium diet -Currently no signs of fluid overload. -Patient denies shortness of breath or orthopnea. -Patient advised to maintain adequate hydration. -Will resume the use of Lasix at time of discharge.   11-chronic paroxysmal atrial fibrillation -Rate controlled currently -Resume adjusted dose of carvedilol and the use of Eliquis. -Continue outpatient follow-up with cardiology service.   12-moderate aortic stenosis -Systolic murmur present on examination -Continue patient follow-up with cardiology as an outpatient. -Not a candidate for TAVR.  Procedures: See below for x-ray reports.  Consultations: Cardiology service  Discharge Exam: Vitals:   10/14/20 1954 10/15/20 0530  BP: (!) 107/52 133/66  Pulse: 67 62  Resp: 16 18  Temp: 97.8 F (36.6 C) 98.2 F (36.8 C)  SpO2: 97% 94%   General exam: Alert, following commands appropriately; no chest pain, no nausea, no vomiting.  Still complaining of intermittent left shoulder pain (but improved). Per daughter at bedside, his mentation is back to baseline.  Respiratory system: No wheezing, no crackles, no using accessory muscle.  Good saturation on room air. Cardiovascular system: Rate controlled, no rubs, no gallops, no JVD on exam. Gastrointestinal system: Abdomen is nondistended, soft and nontender. No organomegaly or masses felt. Normal bowel sounds heard. Central nervous system: Alert and  oriented. No focal neurological deficits. Extremities: No edema; bilateral BKA. Skin: No petechiae. Psychiatry: Mood & affect appropriate.    Discharge Instructions    Allergies as of 10/15/2020       Reactions   Iodinated Diagnostic Agents Other (See Comments)   caused Fever   Dye Fdc Red [red Dye] Hives   Tolerated red Docusate, Niferex   Hydralazine         Medication List     STOP taking these medications    nitrofurantoin (macrocrystal-monohydrate) 100 MG capsule Commonly known as: MACROBID   QUERCETIN PO       TAKE these medications    acetaminophen 500 MG tablet Commonly known as: TYLENOL Take 500 mg by mouth every 6 (six) hours as needed for headache (pain).   amLODipine 5 MG tablet Commonly known as: NORVASC Take 5 mg by mouth daily.   atorvastatin 40 MG tablet Commonly known as: LIPITOR TAKE 1 TABLET(40 MG) BY MOUTH EVERY EVENING   betamethasone dipropionate 0.05 % cream Apply 1 application topically 2 (two) times daily.   carvedilol 3.125 MG tablet Commonly known as: COREG TAKE 1 TABLET(3.125 MG) BY MOUTH TWICE DAILY WITH A MEAL   clopidogrel 75 MG tablet Commonly known as: PLAVIX TAKE 1 TABLET(75 MG) BY MOUTH DAILY WITH BREAKFAST   co-enzyme Q-10 30 MG capsule Take 100 mg by mouth daily.   doxazosin 2 MG tablet Commonly known as: CARDURA TAKE 1 TABLET(2 MG) BY MOUTH DAILY   Eliquis 2.5 MG Tabs tablet Generic drug: apixaban TAKE 1 TABLET(2.5 MG) BY MOUTH TWICE DAILY   famotidine 20 MG tablet Commonly known as: PEPCID Take 1 tablet (20 mg total) by mouth daily.   ferrous sulfate 325 (65 FE) MG  tablet Take 325 mg by mouth 2 (two) times daily with a meal.   fosfomycin 3 g Pack Commonly known as: MONUROL Take 3 g by mouth once for 1 dose.   furosemide 40 MG tablet Commonly known as: Lasix Take 2 tablets (80 mg total) by mouth daily. Take 2 tablets (80mg ) once daily in the morning. Can take an additional 1 tablet (40mg ) in the  afternoon as needed for weight gain (3lbs in 1 day or 5lbs in 1 week).   glipiZIDE 10 MG tablet Commonly known as: GLUCOTROL Take 1 tablet (10 mg total) by mouth daily before breakfast. What changed: when to take this   isosorbide mononitrate 30 MG 24 hr tablet Commonly known as: IMDUR Take 1 tablet (30 mg total) by mouth daily.   latanoprost 0.005 % ophthalmic solution Commonly known as: XALATAN Place 1 drop into both eyes at bedtime.   levothyroxine 75 MCG tablet Commonly known as: SYNTHROID Take 75 mcg by mouth daily before breakfast.   nitroGLYCERIN 0.4 MG SL tablet Commonly known as: NITROSTAT Place 1 tablet (0.4 mg total) under the tongue every 5 (five) minutes x 3 doses as needed for chest pain.   OneTouch Ultra test strip Generic drug: glucose blood 1 each 2 (two) times daily.   polyethylene glycol 17 g packet Commonly known as: MiraLax Take 17 g by mouth daily as needed for moderate constipation.   potassium chloride SA 20 MEQ tablet Commonly known as: KLOR-CON Take 2 tablets (40 mEq total) by mouth daily.   pyridoxine 100 MG tablet Commonly known as: B-6 Take 100 mg by mouth daily.   traMADol 50 MG tablet Commonly known as: Ultram Take 1 tablet (50 mg total) by mouth every 6 (six) hours as needed.   vitamin C 1000 MG tablet Take 1,000 mg by mouth daily.   Vitamin D-3 125 MCG (5000 UT) Tabs Take 5,000 Units by mouth every evening.   vitamin E 180 MG (400 UNITS) capsule Take 400 Units by mouth daily.   zinc gluconate 50 MG tablet Take 50 mg by mouth daily.       Allergies  Allergen Reactions   Iodinated Diagnostic Agents Other (See Comments)    caused Fever   Dye Fdc Red [Red Dye] Hives    Tolerated red Docusate, Niferex   Hydralazine      The results of significant diagnostics from this hospitalization (including imaging, microbiology, ancillary and laboratory) are listed below for reference.    Significant Diagnostic Studies: CT Head Wo  Contrast  Result Date: 10/11/2020 CLINICAL DATA:  Altered mental status EXAM: CT HEAD WITHOUT CONTRAST TECHNIQUE: Contiguous axial images were obtained from the base of the skull through the vertex without intravenous contrast. COMPARISON:  Head CT 08/09/2020 FINDINGS: Brain: There is thin pachymeningeal thickening versus chronic subdural collections overlying the bilateral cerebral hemispheres measuring up to 3 mm in the coronal plane bilaterally, unchanged (40-71). There is no mass effect on the underlying brain parenchyma. There is no evidence of acute intracranial hemorrhage, new extra-axial fluid collection, or acute infarct. There is unchanged mild global parenchymal volume loss and chronic white matter microangiopathy. The ventricles are stable in size. There is no mass lesion. There is no midline shift. Vascular: There is calcification of the bilateral cavernous ICAs. Skull: Normal. Negative for fracture or focal lesion. Sinuses/Orbits: There is mild mucosal thickening along the floor of the left maxillary sinus. Bilateral lens implants are in place. The globes and orbits are otherwise unremarkable. Other:  None. IMPRESSION: 1. No acute intracranial pathology. 2. Unchanged pachymeningeal thickening versus thin chronic subdural collections overlying the bilateral cerebral hemispheres. No mass effect on the underlying brain parenchyma or midline shift. Electronically Signed   By: Valetta Mole M.D.   On: 10/11/2020 12:21   DG Chest Port 1 View  Result Date: 10/11/2020 CLINICAL DATA:  Chest pain last night, history of CHF, COVID EXAM: PORTABLE CHEST 1 VIEW COMPARISON:  Chest radiograph 08/09/2020 FINDINGS: The heart is enlarged, grossly similar to the prior study. The mediastinal contours are prominent, also not significantly changed. There is a moderate layering left pleural effusion with adjacent consolidative opacity in the left base. This is slightly worsened since 08/09/2020. The left upper lobe  remains well aerated. There is no focal airspace disease on the right. There is no significant right pleural effusion. There is no pneumothorax. There is no acute osseous abnormality. IMPRESSION: Interval increase in size of the moderate left pleural effusion with worsened consolidation in the left base since 08/09/2020. Electronically Signed   By: Valetta Mole M.D.   On: 10/11/2020 11:37    Microbiology: Recent Results (from the past 240 hour(s))  Urine Culture     Status: Abnormal   Collection Time: 10/11/20 10:50 AM   Specimen: Urine, Catheterized  Result Value Ref Range Status   Specimen Description   Final    URINE, CATHETERIZED Performed at [facility], [address] [place], [place] [address]    Special Requests   Final    NONE Performed at [facility], [address] [place], [place] [address]    Culture (A)  Final    >=100,000 COLONIES/mL KLEBSIELLA PNEUMONIAE Confirmed Extended Spectrum Beta-Lactamase Producer (ESBL).  In bloodstream infections from ESBL organisms, carbapenems are preferred over piperacillin/tazobactam. They are shown to have a lower risk of mortality.    Report Status 10/13/2020 FINAL  Final   Organism ID, Bacteria KLEBSIELLA PNEUMONIAE (A)  Final      Susceptibility   Klebsiella pneumoniae - MIC*    AMPICILLIN >=32 RESISTANT Resistant     CEFAZOLIN >=64 RESISTANT Resistant     CEFEPIME >=32 RESISTANT Resistant     CEFTRIAXONE >=64 RESISTANT Resistant     CIPROFLOXACIN >=4 RESISTANT Resistant     GENTAMICIN >=16 RESISTANT Resistant     IMIPENEM <=0.25 SENSITIVE Sensitive     NITROFURANTOIN 256 RESISTANT Resistant     TRIMETH/SULFA >=320 RESISTANT Resistant     AMPICILLIN/SULBACTAM >=32 RESISTANT Resistant     PIP/TAZO 64 INTERMEDIATE Intermediate     * >=100,000 COLONIES/mL KLEBSIELLA PNEUMONIAE  Culture, blood (routine x 2)     Status: None (Preliminary result)   Collection Time: 10/11/20 10:56 AM   Specimen: Left Antecubital; Blood  Result Value  Ref Range Status   Specimen Description LEFT ANTECUBITAL  Final   Special Requests   Final    BOTTLES DRAWN AEROBIC AND ANAEROBIC Blood Culture adequate volume   Culture   Final    NO GROWTH 4 DAYS Performed at [facility], [address] [place], [place] [address]    Report Status PENDING  Incomplete  Culture, blood (routine x 2)     Status: None (Preliminary result)   Collection Time: 10/11/20 11:01 AM   Specimen: Right Antecubital; Blood  Result Value Ref Range Status   Specimen Description RIGHT ANTECUBITAL  Final   Special Requests   Final    BOTTLES DRAWN AEROBIC AND ANAEROBIC Blood Culture results may not be optimal due to an  excessive volume of blood received in culture bottles   Culture   Final    NO GROWTH 4 DAYS Performed at [facility], [address] [place], [place] [address]    Report Status PENDING  Incomplete  Urine Culture     Status: Abnormal   Collection Time: 10/11/20 12:15 PM   Specimen: Urine, Catheterized  Result Value Ref Range Status   Specimen Description   Final    URINE, CATHETERIZED Performed at [facility], [address] [place], [place] [address]    Special Requests   Final    NONE Performed at [facility], [address] [place],  [address]    Culture (A)  Final    >=100,000 COLONIES/mL KLEBSIELLA PNEUMONIAE Confirmed Extended Spectrum Beta-Lactamase Producer (ESBL).  In bloodstream infections from ESBL organisms, carbapenems are preferred over piperacillin/tazobactam. They are shown to have a lower risk of mortality.    Report Status 10/13/2020 FINAL  Final   Organism ID, Bacteria KLEBSIELLA PNEUMONIAE (A)  Final      Susceptibility   Klebsiella pneumoniae - MIC*    AMPICILLIN >=32 RESISTANT Resistant     CEFAZOLIN >=64 RESISTANT Resistant     CEFEPIME >=32 RESISTANT Resistant     CEFTRIAXONE >=64 RESISTANT Resistant     CIPROFLOXACIN >=4 RESISTANT Resistant     GENTAMICIN >=16 RESISTANT Resistant     IMIPENEM <=0.25  SENSITIVE Sensitive     NITROFURANTOIN 128 RESISTANT Resistant     TRIMETH/SULFA >=320 RESISTANT Resistant     AMPICILLIN/SULBACTAM >=32 RESISTANT Resistant     PIP/TAZO 64 INTERMEDIATE Intermediate     * >=100,000 COLONIES/mL KLEBSIELLA PNEUMONIAE  Resp Panel by RT-PCR (Flu A&B, Covid) Urine, Catheterized     Status: None   Collection Time: 10/11/20 12:15 PM   Specimen: Urine, Catheterized; Nasopharyngeal(NP) swabs in vial transport medium  Result Value Ref Range Status   SARS Coronavirus 2 by RT PCR NEGATIVE NEGATIVE Final    Comment: (NOTE) SARS-CoV-2 target nucleic acids are NOT DETECTED.  The SARS-CoV-2 RNA is generally detectable in upper respiratory specimens during the acute phase of infection. The lowest concentration of SARS-CoV-2 viral copies this assay can detect is 138 copies/mL. A negative result does not preclude SARS-Cov-2 infection and should not be used as the sole basis for treatment or other patient management decisions. A negative result may occur with  improper specimen collection/handling, submission of specimen other than nasopharyngeal swab, presence of viral mutation(s) within the areas targeted by this assay, and inadequate number of viral copies(<138 copies/mL). A negative result must be combined with clinical observations, patient history, and epidemiological information. The expected result is Negative.  Fact Sheet for Patients:  EntrepreneurPulse.com.au  Fact Sheet for Healthcare Providers:  IncredibleEmployment.be  This test is no t yet approved or cleared by the [place] FDA and  has been authorized for detection and/or diagnosis of SARS-CoV-2 by FDA under an Emergency Use Authorization (EUA). This EUA will remain  in effect (meaning this test can be used) for the duration of the COVID-19 declaration under Section 564(b)(1) of the Act, 21 U.S.C.section 360bbb-3(b)(1), unless the authorization is terminated   or revoked sooner.       Influenza A by PCR NEGATIVE NEGATIVE Final   Influenza B by PCR NEGATIVE NEGATIVE Final    Comment: (NOTE) The Xpert Xpress SARS-CoV-2/FLU/RSV plus assay is intended as an aid in the diagnosis of influenza from Nasopharyngeal swab specimens and should not be used as a sole basis for  treatment. Nasal washings and aspirates are unacceptable for Xpert Xpress SARS-CoV-2/FLU/RSV testing.  Fact Sheet for Patients: EntrepreneurPulse.com.au  Fact Sheet for Healthcare Providers: IncredibleEmployment.be  This test is not yet approved or cleared by the [place] FDA and has been authorized for detection and/or diagnosis of SARS-CoV-2 by FDA under an Emergency Use Authorization (EUA). This EUA will remain in effect (meaning this test can be used) for the duration of the COVID-19 declaration under Section 564(b)(1) of the Act, 21 U.S.C. section 360bbb-3(b)(1), unless the authorization is terminated or revoked.  Performed at [facility], [address] [place], [place] [address]   Culture, blood (routine x 2)     Status: None (Preliminary result)   Collection Time: 10/11/20  2:59 PM   Specimen: BLOOD LEFT HAND  Result Value Ref Range Status   Specimen Description BLOOD LEFT HAND  Final   Special Requests   Final    Blood Culture results may not be optimal due to an excessive volume of blood received in culture bottles BOTTLES DRAWN AEROBIC AND ANAEROBIC   Culture   Final    NO GROWTH 4 DAYS Performed at [facility], [address] [place], [place] [address]    Report Status PENDING  Incomplete  Culture, blood (routine x 2)     Status: None (Preliminary result)   Collection Time: 10/11/20  2:59 PM   Specimen: BLOOD RIGHT ARM  Result Value Ref Range Status   Specimen Description BLOOD RIGHT ARM  Final   Special Requests   Final    Blood Culture results may not be optimal due to an excessive volume of blood received in  culture bottles BOTTLES DRAWN AEROBIC AND ANAEROBIC   Culture   Final    NO GROWTH 4 DAYS Performed at [facility], [address] [place], [place] [address]    Report Status PENDING  Incomplete     Labs: Basic Metabolic Panel: Recent Labs  Lab 10/11/20 1050 10/12/20 0107 10/13/20 0526 10/14/20 0523 10/15/20 0503  NA 135 138 137 135 132*  K 3.7 3.5 3.6 3.5 3.6  CL 101 108 104 104 103  CO2 23 23 23 22  20*  GLUCOSE 138* 60* 158* 129* 120*  BUN 45* 43* 39* 38* 43*  CREATININE 3.31* 3.23* 3.25* 3.46* 3.65*  CALCIUM 8.8* 8.2* 8.6* 8.2* 8.2*  MG  --  1.9 2.0 1.9 1.9  PHOS  --  4.3 3.8 3.7 4.3   Liver Function Tests: Recent Labs  Lab 10/11/20 1050 10/12/20 0107 10/13/20 0526 10/14/20 0523 10/15/20 0503  AST 20 18 90* 90* 57*  ALT 16 13 19 20 18   ALKPHOS 45 29* 27* 27* 30*  BILITOT 0.6 0.6 0.5 0.9 0.8  PROT 7.7 6.3* 6.8 6.4* 6.5  ALBUMIN 3.5 2.9* 3.0* 2.9* 2.8*   Recent Labs  Lab 10/11/20 1050  LIPASE 26   No results for input(s): AMMONIA in the last 168 hours. CBC: Recent Labs  Lab 10/11/20 1050  WBC 16.0*  NEUTROABS 12.5*  HGB 11.7*  HCT 34.9*  MCV 99.7  PLT 183   Cardiac Enzymes: No results for input(s): CKTOTAL, CKMB, CKMBINDEX, TROPONINI in the last 168 hours. BNP: BNP (last 3 results) Recent Labs    06/17/20 0313  BNP 412.0*    ProBNP (last 3 results) No results for input(s): PROBNP in the last 8760 hours.  CBG: Recent Labs  Lab 10/14/20 0740 10/14/20 1115 10/14/20 1637 10/14/20 2022 10/15/20 0715  GLUCAP 119* 165* 144* 152* 119*  Signed:  Barton Dubois MD.  [facility] 10/15/2020, 7:49 AM

## 2020-10-16 DIAGNOSIS — I5033 Acute on chronic diastolic (congestive) heart failure: Secondary | ICD-10-CM | POA: Diagnosis not present

## 2020-10-16 DIAGNOSIS — Z87440 Personal history of urinary (tract) infections: Secondary | ICD-10-CM | POA: Diagnosis not present

## 2020-10-16 DIAGNOSIS — E1122 Type 2 diabetes mellitus with diabetic chronic kidney disease: Secondary | ICD-10-CM | POA: Diagnosis not present

## 2020-10-16 DIAGNOSIS — U071 COVID-19: Secondary | ICD-10-CM | POA: Diagnosis not present

## 2020-10-16 DIAGNOSIS — E039 Hypothyroidism, unspecified: Secondary | ICD-10-CM | POA: Diagnosis not present

## 2020-10-16 DIAGNOSIS — Z79891 Long term (current) use of opiate analgesic: Secondary | ICD-10-CM | POA: Diagnosis not present

## 2020-10-16 DIAGNOSIS — Z7901 Long term (current) use of anticoagulants: Secondary | ICD-10-CM | POA: Diagnosis not present

## 2020-10-16 DIAGNOSIS — I499 Cardiac arrhythmia, unspecified: Secondary | ICD-10-CM | POA: Diagnosis not present

## 2020-10-16 DIAGNOSIS — I130 Hypertensive heart and chronic kidney disease with heart failure and stage 1 through stage 4 chronic kidney disease, or unspecified chronic kidney disease: Secondary | ICD-10-CM | POA: Diagnosis not present

## 2020-10-16 DIAGNOSIS — I4510 Unspecified right bundle-branch block: Secondary | ICD-10-CM | POA: Diagnosis not present

## 2020-10-16 DIAGNOSIS — I350 Nonrheumatic aortic (valve) stenosis: Secondary | ICD-10-CM | POA: Diagnosis not present

## 2020-10-16 DIAGNOSIS — Z7902 Long term (current) use of antithrombotics/antiplatelets: Secondary | ICD-10-CM | POA: Diagnosis not present

## 2020-10-16 DIAGNOSIS — I480 Paroxysmal atrial fibrillation: Secondary | ICD-10-CM | POA: Diagnosis not present

## 2020-10-16 DIAGNOSIS — L89312 Pressure ulcer of right buttock, stage 2: Secondary | ICD-10-CM | POA: Diagnosis not present

## 2020-10-16 DIAGNOSIS — E1151 Type 2 diabetes mellitus with diabetic peripheral angiopathy without gangrene: Secondary | ICD-10-CM | POA: Diagnosis not present

## 2020-10-16 DIAGNOSIS — E785 Hyperlipidemia, unspecified: Secondary | ICD-10-CM | POA: Diagnosis not present

## 2020-10-16 DIAGNOSIS — H409 Unspecified glaucoma: Secondary | ICD-10-CM | POA: Diagnosis not present

## 2020-10-16 DIAGNOSIS — N184 Chronic kidney disease, stage 4 (severe): Secondary | ICD-10-CM | POA: Diagnosis not present

## 2020-10-16 DIAGNOSIS — G4730 Sleep apnea, unspecified: Secondary | ICD-10-CM | POA: Diagnosis not present

## 2020-10-16 DIAGNOSIS — Z466 Encounter for fitting and adjustment of urinary device: Secondary | ICD-10-CM | POA: Diagnosis not present

## 2020-10-16 DIAGNOSIS — Z7984 Long term (current) use of oral hypoglycemic drugs: Secondary | ICD-10-CM | POA: Diagnosis not present

## 2020-10-16 DIAGNOSIS — L89322 Pressure ulcer of left buttock, stage 2: Secondary | ICD-10-CM | POA: Diagnosis not present

## 2020-10-16 DIAGNOSIS — I252 Old myocardial infarction: Secondary | ICD-10-CM | POA: Diagnosis not present

## 2020-10-16 DIAGNOSIS — R338 Other retention of urine: Secondary | ICD-10-CM | POA: Diagnosis not present

## 2020-10-16 LAB — CULTURE, BLOOD (ROUTINE X 2)
Culture: NO GROWTH
Culture: NO GROWTH
Culture: NO GROWTH
Culture: NO GROWTH
Special Requests: ADEQUATE

## 2020-10-17 DIAGNOSIS — I252 Old myocardial infarction: Secondary | ICD-10-CM | POA: Diagnosis not present

## 2020-10-17 DIAGNOSIS — Z466 Encounter for fitting and adjustment of urinary device: Secondary | ICD-10-CM | POA: Diagnosis not present

## 2020-10-17 DIAGNOSIS — U071 COVID-19: Secondary | ICD-10-CM | POA: Diagnosis not present

## 2020-10-17 DIAGNOSIS — Z7901 Long term (current) use of anticoagulants: Secondary | ICD-10-CM | POA: Diagnosis not present

## 2020-10-17 DIAGNOSIS — E1151 Type 2 diabetes mellitus with diabetic peripheral angiopathy without gangrene: Secondary | ICD-10-CM | POA: Diagnosis not present

## 2020-10-17 DIAGNOSIS — I5033 Acute on chronic diastolic (congestive) heart failure: Secondary | ICD-10-CM | POA: Diagnosis not present

## 2020-10-17 DIAGNOSIS — H409 Unspecified glaucoma: Secondary | ICD-10-CM | POA: Diagnosis not present

## 2020-10-17 DIAGNOSIS — Z87440 Personal history of urinary (tract) infections: Secondary | ICD-10-CM | POA: Diagnosis not present

## 2020-10-17 DIAGNOSIS — L89312 Pressure ulcer of right buttock, stage 2: Secondary | ICD-10-CM | POA: Diagnosis not present

## 2020-10-17 DIAGNOSIS — I130 Hypertensive heart and chronic kidney disease with heart failure and stage 1 through stage 4 chronic kidney disease, or unspecified chronic kidney disease: Secondary | ICD-10-CM | POA: Diagnosis not present

## 2020-10-17 DIAGNOSIS — E1122 Type 2 diabetes mellitus with diabetic chronic kidney disease: Secondary | ICD-10-CM | POA: Diagnosis not present

## 2020-10-17 DIAGNOSIS — I350 Nonrheumatic aortic (valve) stenosis: Secondary | ICD-10-CM | POA: Diagnosis not present

## 2020-10-17 DIAGNOSIS — L89322 Pressure ulcer of left buttock, stage 2: Secondary | ICD-10-CM | POA: Diagnosis not present

## 2020-10-17 DIAGNOSIS — E785 Hyperlipidemia, unspecified: Secondary | ICD-10-CM | POA: Diagnosis not present

## 2020-10-17 DIAGNOSIS — Z7902 Long term (current) use of antithrombotics/antiplatelets: Secondary | ICD-10-CM | POA: Diagnosis not present

## 2020-10-17 DIAGNOSIS — G4730 Sleep apnea, unspecified: Secondary | ICD-10-CM | POA: Diagnosis not present

## 2020-10-17 DIAGNOSIS — N184 Chronic kidney disease, stage 4 (severe): Secondary | ICD-10-CM | POA: Diagnosis not present

## 2020-10-17 DIAGNOSIS — Z79891 Long term (current) use of opiate analgesic: Secondary | ICD-10-CM | POA: Diagnosis not present

## 2020-10-17 DIAGNOSIS — I480 Paroxysmal atrial fibrillation: Secondary | ICD-10-CM | POA: Diagnosis not present

## 2020-10-17 DIAGNOSIS — I499 Cardiac arrhythmia, unspecified: Secondary | ICD-10-CM | POA: Diagnosis not present

## 2020-10-17 DIAGNOSIS — E039 Hypothyroidism, unspecified: Secondary | ICD-10-CM | POA: Diagnosis not present

## 2020-10-17 DIAGNOSIS — I4510 Unspecified right bundle-branch block: Secondary | ICD-10-CM | POA: Diagnosis not present

## 2020-10-17 DIAGNOSIS — R338 Other retention of urine: Secondary | ICD-10-CM | POA: Diagnosis not present

## 2020-10-17 DIAGNOSIS — Z7984 Long term (current) use of oral hypoglycemic drugs: Secondary | ICD-10-CM | POA: Diagnosis not present

## 2020-10-18 DIAGNOSIS — E039 Hypothyroidism, unspecified: Secondary | ICD-10-CM | POA: Diagnosis not present

## 2020-10-18 DIAGNOSIS — E1122 Type 2 diabetes mellitus with diabetic chronic kidney disease: Secondary | ICD-10-CM | POA: Diagnosis not present

## 2020-10-18 DIAGNOSIS — I480 Paroxysmal atrial fibrillation: Secondary | ICD-10-CM | POA: Diagnosis not present

## 2020-10-18 DIAGNOSIS — G9341 Metabolic encephalopathy: Secondary | ICD-10-CM | POA: Diagnosis not present

## 2020-10-18 DIAGNOSIS — E782 Mixed hyperlipidemia: Secondary | ICD-10-CM | POA: Diagnosis not present

## 2020-10-18 DIAGNOSIS — I5033 Acute on chronic diastolic (congestive) heart failure: Secondary | ICD-10-CM | POA: Diagnosis not present

## 2020-10-18 DIAGNOSIS — N390 Urinary tract infection, site not specified: Secondary | ICD-10-CM | POA: Diagnosis not present

## 2020-10-19 ENCOUNTER — Other Ambulatory Visit: Payer: Self-pay | Admitting: *Deleted

## 2020-10-19 NOTE — Patient Outreach (Signed)
[facility]) Care Management  [place]  10/19/2020   [name] [birth year] [id number]   Member admitted to hospital 9/28-10/2, recurrent UTI.  Call placed to daughter, state member was doing well initially after discharge but has now started to decline again.  He was seen by PCP yesterday, daughter report "nothing else they can do."  Family has discussed transition to hospice, son called palliative care earlier today, waiting on call back.  Denies any urgent concerns, encouraged to contact this care manager with questions.    Encounter Medications:  Outpatient Encounter Medications as of 10/19/2020  Medication Sig   acetaminophen (TYLENOL) 500 MG tablet Take 500 mg by mouth every 6 (six) hours as needed for headache (pain).   amLODipine (NORVASC) 5 MG tablet Take 5 mg by mouth daily.   apixaban (ELIQUIS) 2.5 MG TABS tablet TAKE 1 TABLET(2.5 MG) BY MOUTH TWICE DAILY   Ascorbic Acid (VITAMIN C) 1000 MG tablet Take 1,000 mg by mouth daily.    atorvastatin (LIPITOR) 40 MG tablet TAKE 1 TABLET(40 MG) BY MOUTH EVERY EVENING   betamethasone dipropionate 0.05 % cream Apply 1 application topically 2 (two) times daily.   carvedilol (COREG) 3.125 MG tablet TAKE 1 TABLET(3.125 MG) BY MOUTH TWICE DAILY WITH A MEAL   Cholecalciferol (VITAMIN D-3) 125 MCG (5000 UT) TABS Take 5,000 Units by mouth every evening.   clopidogrel (PLAVIX) 75 MG tablet TAKE 1 TABLET(75 MG) BY MOUTH DAILY WITH BREAKFAST   co-enzyme Q-10 30 MG capsule Take 100 mg by mouth daily.   doxazosin (CARDURA) 2 MG tablet TAKE 1 TABLET(2 MG) BY MOUTH DAILY   famotidine (PEPCID) 20 MG tablet Take 1 tablet (20 mg total) by mouth daily.   ferrous sulfate 325 (65 FE) MG tablet Take 325 mg by mouth 2 (two) times daily with a meal.   furosemide (LASIX) 40 MG tablet Take 2 tablets (80 mg total) by mouth daily. Take 2 tablets (80mg ) once daily in the morning. Can take an additional 1 tablet (40mg ) in the afternoon as  needed for weight gain (3lbs in 1 day or 5lbs in 1 week).   glipiZIDE (GLUCOTROL) 10 MG tablet Take 1 tablet (10 mg total) by mouth daily before breakfast.   isosorbide mononitrate (IMDUR) 30 MG 24 hr tablet Take 1 tablet (30 mg total) by mouth daily.   latanoprost (XALATAN) 0.005 % ophthalmic solution Place 1 drop into both eyes at bedtime.   levothyroxine (SYNTHROID) 75 MCG tablet Take 75 mcg by mouth daily before breakfast.    nitroGLYCERIN (NITROSTAT) 0.4 MG SL tablet Place 1 tablet (0.4 mg total) under the tongue every 5 (five) minutes x 3 doses as needed for chest pain.   ONETOUCH ULTRA test strip 1 each 2 (two) times daily.   polyethylene glycol (MIRALAX) 17 g packet Take 17 g by mouth daily as needed for moderate constipation.   potassium chloride SA (KLOR-CON) 20 MEQ tablet Take 2 tablets (40 mEq total) by mouth daily.   pyridoxine (B-6) 100 MG tablet Take 100 mg by mouth daily.   traMADol (ULTRAM) 50 MG tablet Take 1 tablet (50 mg total) by mouth every 6 (six) hours as needed.   vitamin E 400 UNIT capsule Take 400 Units by mouth daily.   zinc gluconate 50 MG tablet Take 50 mg by mouth daily.   No facility-administered encounter medications on file as of 10/19/2020.    Functional Status:  In your present state of health, do you  have any difficulty performing the following activities: 10/11/2020 06/17/2020  Hearing? Tempie Donning  Vision? N N  Difficulty concentrating or making decisions? Tempie Donning  Walking or climbing stairs? Y Y  Dressing or bathing? Y Y  Doing errands, shopping? Y Y  Some recent data might be hidden    Fall/Depression Screening: Fall Risk  08/31/2020 03/28/2020 07/21/2019  Falls in the past year? 0 0 1  Number falls in past yr: - - 1  Injury with Fall? - - 1  Risk for fall due to : Impaired balance/gait;Impaired mobility Impaired balance/gait;Impaired mobility Impaired mobility;Impaired balance/gait  Risk for fall due to: Comment - - Bilateral amputation  Follow up Falls  evaluation completed Falls evaluation completed;Education provided Falls prevention discussed;Education provided;Falls evaluation completed   PHQ 2/9 Scores 08/31/2020 03/28/2020 10/19/2019 06/02/2019 01/12/2019  PHQ - 2 Score 0 0 - 1 2  PHQ- 9 Score - - - - 3  Exception Documentation - - Other- indicate reason in comment box - -    Assessment:   Care Plan There are no care plans that you recently modified to display for this patient.    Goals Addressed             This Visit's Progress    [facility] - Find Help in My Community       Timeframe:  Short-Term Goal Priority:  High Start Date:         10/19/2020                    Expected End Date:       01/19/2021                Follow Up Date 11/02/2020    - follow-up on any referrals for help I am given - make a list of family or friends that I can call    Why is this important?   Knowing how and where to find help for yourself or family in your neighborhood and community is an important skill.  You will want to take some steps to learn how.    Notes:   10/6 - Member already active with palliative care, considering hospice.  Call placed to [facility] to follow up with family for transition     [facility] - Set My Target A1C   On track    Barriers: Health Behaviors   Timeframe:  Long-Range Goal Priority:  Medium Start Date:       7/8     (goal restarted)                Expected End Date:   11/8          - set target A1C (7)    Why is this important?   Your target A1C is decided together by you and your doctor.  It is based on several things like your age and other health issues.    Notes:   11/18 - Discussed dose change for oral diabetic agents  12/28 - Call placed to PCP office requesting order for new A1C lab  1/28 - Re-educated on DM management in effort to decrease A1C  2/21 - Review CBG trends, encouraged to continue plan of care  3/21 - Reminded son that controlled CBG's will promote wound healing  4/18 - Most  recent A1C - 7.5, son verbalized understanding of ongoing management of blood sugars and consistent A1C within range  5/16 - Member has increased his mobility with  outpatient PT, encouraged to continue as increased exercise helps to decrease blood sugars and A1C  6/8 - Goal extended, follow up with PCP on 6/13.    7/8 - Continues to work on improving A1C, daughter now retired and will be working with member more often.    8/4 - Daughter report member's blood sugars have decreased, will have A1C repeated during next visit  9/8 - Most recent A1C = 7.1.  Daughter state blood sugars are better, ranging 90-110's.          Plan:  Follow-up: Patient agrees to Care Plan and Follow-up. Follow-up in 2 week(s).  If transitioned to hospice, will close case.  [name], [place], MSN [place] [phone]

## 2020-10-23 DIAGNOSIS — R809 Proteinuria, unspecified: Secondary | ICD-10-CM | POA: Diagnosis not present

## 2020-10-23 DIAGNOSIS — E1129 Type 2 diabetes mellitus with other diabetic kidney complication: Secondary | ICD-10-CM | POA: Diagnosis not present

## 2020-10-23 DIAGNOSIS — N170 Acute kidney failure with tubular necrosis: Secondary | ICD-10-CM | POA: Diagnosis not present

## 2020-10-23 DIAGNOSIS — N189 Chronic kidney disease, unspecified: Secondary | ICD-10-CM | POA: Diagnosis not present

## 2020-10-23 DIAGNOSIS — E1122 Type 2 diabetes mellitus with diabetic chronic kidney disease: Secondary | ICD-10-CM | POA: Diagnosis not present

## 2020-10-24 ENCOUNTER — Other Ambulatory Visit: Payer: Self-pay | Admitting: Internal Medicine

## 2020-10-24 DIAGNOSIS — U071 COVID-19: Secondary | ICD-10-CM | POA: Diagnosis not present

## 2020-10-24 DIAGNOSIS — L89322 Pressure ulcer of left buttock, stage 2: Secondary | ICD-10-CM | POA: Diagnosis not present

## 2020-10-24 DIAGNOSIS — L89312 Pressure ulcer of right buttock, stage 2: Secondary | ICD-10-CM | POA: Diagnosis not present

## 2020-10-24 DIAGNOSIS — Z466 Encounter for fitting and adjustment of urinary device: Secondary | ICD-10-CM | POA: Diagnosis not present

## 2020-10-25 DIAGNOSIS — E1122 Type 2 diabetes mellitus with diabetic chronic kidney disease: Secondary | ICD-10-CM | POA: Diagnosis not present

## 2020-10-25 DIAGNOSIS — R809 Proteinuria, unspecified: Secondary | ICD-10-CM | POA: Diagnosis not present

## 2020-10-25 DIAGNOSIS — N170 Acute kidney failure with tubular necrosis: Secondary | ICD-10-CM | POA: Diagnosis not present

## 2020-10-25 DIAGNOSIS — D638 Anemia in other chronic diseases classified elsewhere: Secondary | ICD-10-CM | POA: Diagnosis not present

## 2020-10-25 DIAGNOSIS — E1129 Type 2 diabetes mellitus with other diabetic kidney complication: Secondary | ICD-10-CM | POA: Diagnosis not present

## 2020-10-25 DIAGNOSIS — N189 Chronic kidney disease, unspecified: Secondary | ICD-10-CM | POA: Diagnosis not present

## 2020-10-25 DIAGNOSIS — I5032 Chronic diastolic (congestive) heart failure: Secondary | ICD-10-CM | POA: Diagnosis not present

## 2020-10-26 DIAGNOSIS — Z7984 Long term (current) use of oral hypoglycemic drugs: Secondary | ICD-10-CM | POA: Diagnosis not present

## 2020-10-26 DIAGNOSIS — L89312 Pressure ulcer of right buttock, stage 2: Secondary | ICD-10-CM | POA: Diagnosis not present

## 2020-10-26 DIAGNOSIS — E1122 Type 2 diabetes mellitus with diabetic chronic kidney disease: Secondary | ICD-10-CM | POA: Diagnosis not present

## 2020-10-26 DIAGNOSIS — I252 Old myocardial infarction: Secondary | ICD-10-CM | POA: Diagnosis not present

## 2020-10-26 DIAGNOSIS — Z7902 Long term (current) use of antithrombotics/antiplatelets: Secondary | ICD-10-CM | POA: Diagnosis not present

## 2020-10-26 DIAGNOSIS — E039 Hypothyroidism, unspecified: Secondary | ICD-10-CM | POA: Diagnosis not present

## 2020-10-26 DIAGNOSIS — L89322 Pressure ulcer of left buttock, stage 2: Secondary | ICD-10-CM | POA: Diagnosis not present

## 2020-10-26 DIAGNOSIS — U071 COVID-19: Secondary | ICD-10-CM | POA: Diagnosis not present

## 2020-10-26 DIAGNOSIS — Z7901 Long term (current) use of anticoagulants: Secondary | ICD-10-CM | POA: Diagnosis not present

## 2020-10-26 DIAGNOSIS — N184 Chronic kidney disease, stage 4 (severe): Secondary | ICD-10-CM | POA: Diagnosis not present

## 2020-10-26 DIAGNOSIS — I130 Hypertensive heart and chronic kidney disease with heart failure and stage 1 through stage 4 chronic kidney disease, or unspecified chronic kidney disease: Secondary | ICD-10-CM | POA: Diagnosis not present

## 2020-10-26 DIAGNOSIS — Z466 Encounter for fitting and adjustment of urinary device: Secondary | ICD-10-CM | POA: Diagnosis not present

## 2020-10-26 DIAGNOSIS — H409 Unspecified glaucoma: Secondary | ICD-10-CM | POA: Diagnosis not present

## 2020-10-26 DIAGNOSIS — G4730 Sleep apnea, unspecified: Secondary | ICD-10-CM | POA: Diagnosis not present

## 2020-10-26 DIAGNOSIS — Z87440 Personal history of urinary (tract) infections: Secondary | ICD-10-CM | POA: Diagnosis not present

## 2020-10-26 DIAGNOSIS — I350 Nonrheumatic aortic (valve) stenosis: Secondary | ICD-10-CM | POA: Diagnosis not present

## 2020-10-26 DIAGNOSIS — E785 Hyperlipidemia, unspecified: Secondary | ICD-10-CM | POA: Diagnosis not present

## 2020-10-26 DIAGNOSIS — I4510 Unspecified right bundle-branch block: Secondary | ICD-10-CM | POA: Diagnosis not present

## 2020-10-26 DIAGNOSIS — I5033 Acute on chronic diastolic (congestive) heart failure: Secondary | ICD-10-CM | POA: Diagnosis not present

## 2020-10-26 DIAGNOSIS — E1151 Type 2 diabetes mellitus with diabetic peripheral angiopathy without gangrene: Secondary | ICD-10-CM | POA: Diagnosis not present

## 2020-10-26 DIAGNOSIS — I480 Paroxysmal atrial fibrillation: Secondary | ICD-10-CM | POA: Diagnosis not present

## 2020-10-26 DIAGNOSIS — Z79891 Long term (current) use of opiate analgesic: Secondary | ICD-10-CM | POA: Diagnosis not present

## 2020-10-26 DIAGNOSIS — R338 Other retention of urine: Secondary | ICD-10-CM | POA: Diagnosis not present

## 2020-10-26 DIAGNOSIS — I499 Cardiac arrhythmia, unspecified: Secondary | ICD-10-CM | POA: Diagnosis not present

## 2020-11-01 DIAGNOSIS — I130 Hypertensive heart and chronic kidney disease with heart failure and stage 1 through stage 4 chronic kidney disease, or unspecified chronic kidney disease: Secondary | ICD-10-CM | POA: Diagnosis not present

## 2020-11-01 DIAGNOSIS — Z7901 Long term (current) use of anticoagulants: Secondary | ICD-10-CM | POA: Diagnosis not present

## 2020-11-01 DIAGNOSIS — Z7984 Long term (current) use of oral hypoglycemic drugs: Secondary | ICD-10-CM | POA: Diagnosis not present

## 2020-11-01 DIAGNOSIS — H409 Unspecified glaucoma: Secondary | ICD-10-CM | POA: Diagnosis not present

## 2020-11-01 DIAGNOSIS — L89322 Pressure ulcer of left buttock, stage 2: Secondary | ICD-10-CM | POA: Diagnosis not present

## 2020-11-01 DIAGNOSIS — E1122 Type 2 diabetes mellitus with diabetic chronic kidney disease: Secondary | ICD-10-CM | POA: Diagnosis not present

## 2020-11-01 DIAGNOSIS — L89312 Pressure ulcer of right buttock, stage 2: Secondary | ICD-10-CM | POA: Diagnosis not present

## 2020-11-01 DIAGNOSIS — Z7902 Long term (current) use of antithrombotics/antiplatelets: Secondary | ICD-10-CM | POA: Diagnosis not present

## 2020-11-01 DIAGNOSIS — I252 Old myocardial infarction: Secondary | ICD-10-CM | POA: Diagnosis not present

## 2020-11-01 DIAGNOSIS — Z87440 Personal history of urinary (tract) infections: Secondary | ICD-10-CM | POA: Diagnosis not present

## 2020-11-01 DIAGNOSIS — N184 Chronic kidney disease, stage 4 (severe): Secondary | ICD-10-CM | POA: Diagnosis not present

## 2020-11-01 DIAGNOSIS — U071 COVID-19: Secondary | ICD-10-CM | POA: Diagnosis not present

## 2020-11-01 DIAGNOSIS — R338 Other retention of urine: Secondary | ICD-10-CM | POA: Diagnosis not present

## 2020-11-01 DIAGNOSIS — Z466 Encounter for fitting and adjustment of urinary device: Secondary | ICD-10-CM | POA: Diagnosis not present

## 2020-11-01 DIAGNOSIS — I4510 Unspecified right bundle-branch block: Secondary | ICD-10-CM | POA: Diagnosis not present

## 2020-11-01 DIAGNOSIS — I5033 Acute on chronic diastolic (congestive) heart failure: Secondary | ICD-10-CM | POA: Diagnosis not present

## 2020-11-01 DIAGNOSIS — E785 Hyperlipidemia, unspecified: Secondary | ICD-10-CM | POA: Diagnosis not present

## 2020-11-01 DIAGNOSIS — I350 Nonrheumatic aortic (valve) stenosis: Secondary | ICD-10-CM | POA: Diagnosis not present

## 2020-11-01 DIAGNOSIS — E1151 Type 2 diabetes mellitus with diabetic peripheral angiopathy without gangrene: Secondary | ICD-10-CM | POA: Diagnosis not present

## 2020-11-01 DIAGNOSIS — E039 Hypothyroidism, unspecified: Secondary | ICD-10-CM | POA: Diagnosis not present

## 2020-11-01 DIAGNOSIS — I480 Paroxysmal atrial fibrillation: Secondary | ICD-10-CM | POA: Diagnosis not present

## 2020-11-01 DIAGNOSIS — I499 Cardiac arrhythmia, unspecified: Secondary | ICD-10-CM | POA: Diagnosis not present

## 2020-11-01 DIAGNOSIS — Z79891 Long term (current) use of opiate analgesic: Secondary | ICD-10-CM | POA: Diagnosis not present

## 2020-11-01 DIAGNOSIS — G4730 Sleep apnea, unspecified: Secondary | ICD-10-CM | POA: Diagnosis not present

## 2020-11-02 ENCOUNTER — Other Ambulatory Visit: Payer: Self-pay | Admitting: *Deleted

## 2020-11-02 NOTE — Patient Outreach (Signed)
[facility]) Care Management  11/02/2020  [name] 09-02-1927 [id number]   Outgoing call placed to member's daughter, state member is doing "ok."  Report sacral wound is much better, extremity swelling is also better.  Denies any urgent concerns, encouraged to contact this care manager with questions.  Agrees to follow up within the next month.   Goals Addressed             This Visit's Progress    [facility] - Find Help in My Community   On track    Timeframe:  Short-Term Goal Priority:  High Start Date:         10/19/2020                    Expected End Date:       01/19/2021                Follow Up Date 11/02/2020    - follow-up on any referrals for help I am given - make a list of family or friends that I can call    Why is this important?   Knowing how and where to find help for yourself or family in your neighborhood and community is an important skill.  You will want to take some steps to learn how.    Notes:   10/6 - Member already active with palliative care, considering hospice.  Call placed to [facility] to follow up with family for transition  10/20 - Per daughter, they are in the process of changing from [place] to a severe illness with [place] Pod, state this would make the transition to [facility] hospice easier (this is closet to member).  Member has been identified as eligible for hospice however he is not ready to make that change.  Family is letting member make the final decision on when to transition     COMPLETED: [facility] - Set My Target A1C   On track    Barriers: Health Behaviors   Timeframe:  Long-Range Goal Priority:  Medium Start Date:       7/8     (goal restarted)                Expected End Date:   11/8          - set target A1C (7)    Why is this important?   Your target A1C is decided together by you and your doctor.  It is based on several things like your age and other health issues.    Notes:   11/18 - Discussed  dose change for oral diabetic agents  12/28 - Call placed to PCP office requesting order for new A1C lab  1/28 - Re-educated on DM management in effort to decrease A1C  2/21 - Review CBG trends, encouraged to continue plan of care  3/21 - Reminded son that controlled CBG's will promote wound healing  4/18 - Most recent A1C - 7.5, son verbalized understanding of ongoing management of blood sugars and consistent A1C within range  5/16 - Member has increased his mobility with outpatient PT, encouraged to continue as increased exercise helps to decrease blood sugars and A1C  6/8 - Goal extended, follow up with PCP on 6/13.    7/8 - Continues to work on improving A1C, daughter now retired and will be working with member more often.    8/4 - Daughter report member's blood sugars have decreased, will have A1C repeated during next visit  9/8 - Most recent A1C = 7.1.  Daughter state blood sugars are better, ranging 90-110's.   10/20 - A1C as of 9/29 is 6.6.        [name], [place], MSN [place] [phone]

## 2020-11-07 DIAGNOSIS — R3914 Feeling of incomplete bladder emptying: Secondary | ICD-10-CM | POA: Diagnosis not present

## 2020-11-09 DIAGNOSIS — I499 Cardiac arrhythmia, unspecified: Secondary | ICD-10-CM | POA: Diagnosis not present

## 2020-11-09 DIAGNOSIS — Z79891 Long term (current) use of opiate analgesic: Secondary | ICD-10-CM | POA: Diagnosis not present

## 2020-11-09 DIAGNOSIS — Z7901 Long term (current) use of anticoagulants: Secondary | ICD-10-CM | POA: Diagnosis not present

## 2020-11-09 DIAGNOSIS — Z466 Encounter for fitting and adjustment of urinary device: Secondary | ICD-10-CM | POA: Diagnosis not present

## 2020-11-09 DIAGNOSIS — N184 Chronic kidney disease, stage 4 (severe): Secondary | ICD-10-CM | POA: Diagnosis not present

## 2020-11-09 DIAGNOSIS — Z89511 Acquired absence of right leg below knee: Secondary | ICD-10-CM | POA: Diagnosis not present

## 2020-11-09 DIAGNOSIS — H409 Unspecified glaucoma: Secondary | ICD-10-CM | POA: Diagnosis not present

## 2020-11-09 DIAGNOSIS — Z87440 Personal history of urinary (tract) infections: Secondary | ICD-10-CM | POA: Diagnosis not present

## 2020-11-09 DIAGNOSIS — E785 Hyperlipidemia, unspecified: Secondary | ICD-10-CM | POA: Diagnosis not present

## 2020-11-09 DIAGNOSIS — I5033 Acute on chronic diastolic (congestive) heart failure: Secondary | ICD-10-CM | POA: Diagnosis not present

## 2020-11-09 DIAGNOSIS — Z7984 Long term (current) use of oral hypoglycemic drugs: Secondary | ICD-10-CM | POA: Diagnosis not present

## 2020-11-09 DIAGNOSIS — E1151 Type 2 diabetes mellitus with diabetic peripheral angiopathy without gangrene: Secondary | ICD-10-CM | POA: Diagnosis not present

## 2020-11-09 DIAGNOSIS — L89312 Pressure ulcer of right buttock, stage 2: Secondary | ICD-10-CM | POA: Diagnosis not present

## 2020-11-09 DIAGNOSIS — L89322 Pressure ulcer of left buttock, stage 2: Secondary | ICD-10-CM | POA: Diagnosis not present

## 2020-11-09 DIAGNOSIS — I4510 Unspecified right bundle-branch block: Secondary | ICD-10-CM | POA: Diagnosis not present

## 2020-11-09 DIAGNOSIS — I350 Nonrheumatic aortic (valve) stenosis: Secondary | ICD-10-CM | POA: Diagnosis not present

## 2020-11-09 DIAGNOSIS — R338 Other retention of urine: Secondary | ICD-10-CM | POA: Diagnosis not present

## 2020-11-09 DIAGNOSIS — Z7902 Long term (current) use of antithrombotics/antiplatelets: Secondary | ICD-10-CM | POA: Diagnosis not present

## 2020-11-09 DIAGNOSIS — E039 Hypothyroidism, unspecified: Secondary | ICD-10-CM | POA: Diagnosis not present

## 2020-11-09 DIAGNOSIS — G4730 Sleep apnea, unspecified: Secondary | ICD-10-CM | POA: Diagnosis not present

## 2020-11-09 DIAGNOSIS — I130 Hypertensive heart and chronic kidney disease with heart failure and stage 1 through stage 4 chronic kidney disease, or unspecified chronic kidney disease: Secondary | ICD-10-CM | POA: Diagnosis not present

## 2020-11-09 DIAGNOSIS — I252 Old myocardial infarction: Secondary | ICD-10-CM | POA: Diagnosis not present

## 2020-11-09 DIAGNOSIS — E1122 Type 2 diabetes mellitus with diabetic chronic kidney disease: Secondary | ICD-10-CM | POA: Diagnosis not present

## 2020-11-09 DIAGNOSIS — I480 Paroxysmal atrial fibrillation: Secondary | ICD-10-CM | POA: Diagnosis not present

## 2020-11-13 ENCOUNTER — Ambulatory Visit: Payer: Medicare Other | Admitting: Internal Medicine

## 2020-11-13 ENCOUNTER — Other Ambulatory Visit: Payer: Self-pay

## 2020-11-13 ENCOUNTER — Encounter: Payer: Self-pay | Admitting: Internal Medicine

## 2020-11-13 VITALS — BP 130/66 | HR 52 | Ht 71.0 in | Wt 216.0 lb

## 2020-11-13 DIAGNOSIS — I739 Peripheral vascular disease, unspecified: Secondary | ICD-10-CM

## 2020-11-13 DIAGNOSIS — I350 Nonrheumatic aortic (valve) stenosis: Secondary | ICD-10-CM

## 2020-11-13 DIAGNOSIS — N184 Chronic kidney disease, stage 4 (severe): Secondary | ICD-10-CM | POA: Diagnosis not present

## 2020-11-13 DIAGNOSIS — I5032 Chronic diastolic (congestive) heart failure: Secondary | ICD-10-CM

## 2020-11-13 NOTE — Progress Notes (Signed)
OFFICE NOTE  Chief Complaint:  Follow-up  Primary Care Physician: Redmond School, MD  HPI:  [name]  is an 87 year old gentleman with history of hypertension, dyslipidemia and peripheral vascular disease. He did have claudication and in May 2012 he had high-grade mid SFA stenosis. He had a stent placed in his SFA which improved his claudication and his Doppler studies. In December 2012 his left ABI was 0.61, the right ABI 0.74 with a patent stent. He was seen back by Dr. Gwenlyn Found and recommended followup with me in the office today. His only complaints now are about occasional dizziness especially with change in position, also leg pain with walking but not claudication, mostly joint pain, hip pain which could represent arthritis, wearing glasses.   I the pleasure seeing [name] back today. It's been over a year and a half since I saw him last in the office. He reports doing fairly well and denies any chest pain or worsening shortness of breath. He denies any claudication in his legs. Blood pressure is noted to be elevated somewhat today. He is previous film lisinopril and this was stopped for unknown reasons. Not on aspirin but continues to take Plavix for his PAD.  [name] returns today for follow-up. Overall he is doing fairly well though has complaints about particular right hip pain and knee pain. He also reports some swelling in his legs gets worse during the day but improves with elevating them overnight. He says he gets some numbness and tingling in his feet as well. He had arterial Dopplers last year which show stable ABIs and no evidence of stent occlusion. He is due for repeat arterial Dopplers in April of this year. He denies any chest pain or worsening shortness of breath.  06/12/2016  [name] returns today for follow-up. Recently he saw his nephrologist to auscultated a systolic heart murmur. An echocardiogram was ordered at [facility]. This demonstrated an EF  of 60-65%, there was mild to moderate aortic stenosis with a mean gradient of 15 mmHg and calculated valve area of 1.3-1.4 cm. He denies chest pain, worsening shortness of breath, presyncope, syncope or congestive heart failure symptoms. His only other complaint today is bilateral knee pain. He has a history of knee surgery and has also been having some right hip pain. Blood pressure is at goal. He is on atorvastatin 40 mg daily followed by his primary care provider. He has a history of PAD with lower extremity arterial Dopplers in April 2017 that showed no obstructive PAD and a patent stent.  05/09/2017  [name] returns today for follow-up.  He is reporting some mild shortness of breath as well as lower extremity edema.  According to records he is gained 6 pounds since we last saw him.  He reports worsening lower extremity swelling.  He was scheduled to have a routine echocardiogram on May 30 and office visit with me on May 31, however came in earlier because of worsening shortness of breath.  EKG just shows sinus rhythm with first-degree AV block at 63, RBBB-personally reviewed.  His daughter was accompanying him today and noted that he has been out of several of his medications.  Namely he has not taken tamsulosin, finasteride and levothyroxine.  He is also out of his hydrochlorothiazide diuretic.  06/13/2017  [name] returns today for follow-up.  He had an echocardiogram yesterday which showed LVEF of 65-705%, however he now has moderate aortic stenosis, increased from mild stenosis a year  ago.  He reports marked improvement in his edema although weight is about a pound heavier than it was when I last saw him a month ago.  He is on Lasix now.  Creatinine is slightly higher on the Lasix at 1.6, but may be accepted due to his improvement in edema.  08/11/2017  [name] was seen today in follow-up.  Unfortunately was hospitalized in June with chest heaviness.  He was seen by Dr. Harl Bowie in consultation  who felt that his chest pain was atypical for cardiac pain but was noted to be a new onset atrial fibrillation.  Fortunately spontaneously converted back to sinus however was appropriately switched to Eliquis from Plavix.  He denies any recurrent A. fib since then has had no further chest discomfort.  I suspect he felt unwell either due to the A. fib or as a combination of A. fib and moderate aortic stenosis.  He is scheduled ready for a repeat echocardiogram in November and follow-up with me afterwards.  12/09/2017  [name] is seen today for follow-up of her recent echo.  This was a repeat study to assess any changes in aortic stenosis.  His last study was over 6 months ago.  This demonstrates moderate aortic stenosis with a mean gradient of 26 mmHg, from 21 mmHg previously.  He is asymptomatic with this and denies any recurrent atrial fibrillation.  EKG today shows sinus rhythm.  He is compliant with Eliquis and denies any bleeding problems.  Blood pressures been well controlled at home.  01/25/2019  [name] returns today for follow-up.  Unfortunately he has had a very difficult past year.  He has now undergone bilateral BKA's with recurrent issues due to critical limb ischemia.  He is essentially wheelchair-bound although does have a prosthesis on the right side.  He was noted to have moderate aortic stenosis by echo this past August and will need repeat echo in 6 to 8 months.  Other than struggles with his legs and nonhealing wounds, he denies any chest pain or worsening shortness of breath.  05/05/2019  [name] is seen today in follow-up.  He is accompanied by his son.  He still has difficulty walking on his BKA's.  More recently he has had some worsening edema, particularly upper extremities and his thighs.  He was seen by Dr. Gerarda Fraction, who noted he was markedly bradycardic.  EKG showed possibly a junctional rhythm with PVC at a rate in the upper 40s.  In general [name] runs a heart rate in the mid  47s.  Additionally, he was thought to be in critical aortic stenosis with associated congestive heart failure.  I had just seen [name] in January this year and noted that his repeat echo shows moderate to severe aortic stenosis with a mean gradient of just over 30 mmHg.  09/02/2019  [name] is seen today in follow-up.  He just had a repeat echo which actually shows a lower aortic valve gradient at 27 mmHg.  I suspect this might be under sampling as it is unlikely his aortic valve is improved.  He was seen in the multidisciplinary heart valve clinic by Dr. Angelena Form earlier this year and has been recommended to follow-up in September or October.  At this point does not appear that he needs urgent TAVR.  He also recently had an in office prostate procedure under nitrous oxide.  He tolerated this well after receiving cardiac clearance from me.  03/08/2020  [name] is seen  today in follow-up.  Unfortunately was again hospitalized.  He is been in the hospital several times for UTI and confusion.  It seems to occur every few months with his associated catheter change outs.  Other than this he is doing fairly well.  Creatinine is stable around 2.54.  He had moderate to severe aortic valve stenosis.  It was not thought that he was a good candidate for TAVR possibly due to his advanced chronic kidney disease but a repeat echo would be due in August 2022.  11/13/2020  [name] is seen today for follow-up.  Over this past year its been a difficult 1.  He has had at least a couple admissions for urinary tract infections.  He is now catheterized and has unfortunately progressive renal disease.  His creatinine had been around 2.54 however more recently creatinine was up to 3.65.  He is followed by nephrology.  GFR is quite low.  He does make urine and is on a diuretic.  I suspect he would not be a candidate for dialysis.  Given his worsening renal function, he is also not likely a candidate for TAVR procedure.  He  was seen by Dr. Angelena Form in April 2021 but was not considered a candidate for surgical AVR or even likely TAVR at that time given comorbidities and bilateral BKA with significant PAD.    PMHx:  Past Medical History:  Diagnosis Date   Arthritis    Borderline diabetic    CHF (congestive heart failure) (HCC)    Chronic kidney disease    Diabetes mellitus without complication ([place])    Dysrhythmia    Glaucoma    Hyperlipidemia    Hypertension    Hypothyroidism    Myocardial infarction [facility])    PVD (peripheral vascular disease) ([place])    Sleep apnea    Cpap ordered but doesnt use   Urinary retention due to benign prostatic hyperplasia 12/28/2018    Past Surgical History:  Procedure Laterality Date   ABDOMINAL AORTOGRAM W/LOWER EXTREMITY N/A 07/27/2018   Procedure: ABDOMINAL AORTOGRAM W/LOWER EXTREMITY;  Surgeon: Lorretta Harp, MD;  Location: [place] [facility];  Service: Cardiovascular;  Laterality: N/A;   AMPUTATION Right 08/28/2018   Procedure: RIGHT BELOW KNEE AMPUTATION;  Surgeon: [name], MD;  Location: [place];  Service: Orthopedics;  Laterality: Right;   AMPUTATION Left 12/02/2018   Procedure: LEFT TRANSMETATARSAL AMPUTATION AND NEGATIVE PRESSURE WOUND VAC PLACEMENT;  Surgeon: [name], MD;  Location: [place];  Service: Orthopedics;  Laterality: Left;   AMPUTATION Left 12/18/2018   Procedure: LEFT BELOW KNEE AMPUTATION;  Surgeon: [name], MD;  Location: [place];  Service: Orthopedics;  Laterality: Left;   APPENDECTOMY  1943   BELOW KNEE LEG AMPUTATION Left 12/18/2018   CATARACT EXTRACTION  2012   x2   COLONOSCOPY N/A 11/01/2014   Procedure: COLONOSCOPY;  Surgeon: Aviva Signs, MD;  [facility];  Service: Gastroenterology;  Laterality: N/A;   ESOPHAGOGASTRODUODENOSCOPY N/A 11/01/2014   Procedure: ESOPHAGOGASTRODUODENOSCOPY (EGD);  Surgeon: Aviva Signs, MD;  [facility];  Service: Gastroenterology;  Laterality: N/A;   HERNIA REPAIR Left     INGUINAL HERNIA REPAIR Right 03/01/2013   Procedure: RIGHT INGUINAL HERNIORRHAPHY;  Surgeon: Jamesetta So, MD;  [facility];  Service: General;  Laterality: Right;   INSERTION OF MESH Right 03/01/2013   Procedure: INSERTION OF MESH;  Surgeon: Jamesetta So, MD;  [facility];  Service: General;  Laterality: Right;  LOWER EXTREMITY ANGIOGRAPHY Right 08/25/2018   Procedure: LOWER EXTREMITY ANGIOGRAPHY;  Surgeon: Wellington Hampshire, MD;  Location: [place] [facility];  Service: Cardiovascular;  Laterality: Right;   LOWER EXTREMITY ANGIOGRAPHY N/A 11/25/2018   Procedure: LOWER EXTREMITY ANGIOGRAPHY;  Surgeon: Wellington Hampshire, MD;  Location: [place] [facility];  Service: Cardiovascular;  Laterality: N/A;   NM MYOCAR PERF WALL MOTION  06/05/2010   no significant ischemia   PERIPHERAL VASCULAR ATHERECTOMY Right 08/03/2018   Procedure: PERIPHERAL VASCULAR ATHERECTOMY;  Surgeon: Lorretta Harp, MD;  Location: [place] [facility];  Service: Cardiovascular;  Laterality: Right;   PERIPHERAL VASCULAR ATHERECTOMY Left 11/23/2018   Procedure: PERIPHERAL VASCULAR ATHERECTOMY;  Surgeon: Lorretta Harp, MD;  Location: [place] [facility];  Service: Cardiovascular;  Laterality: Left;  sfa with dc balloon   PERIPHERAL VASCULAR BALLOON ANGIOPLASTY Left 11/25/2018   Procedure: PERIPHERAL VASCULAR BALLOON ANGIOPLASTY;  Surgeon: Wellington Hampshire, MD;  Location: [place] [facility];  Service: Cardiovascular;  Laterality: Left;  popliteal   PERIPHERAL VASCULAR INTERVENTION Left 11/25/2018   Procedure: PERIPHERAL VASCULAR INTERVENTION;  Surgeon: Wellington Hampshire, MD;  Location: [place] [facility];  Service: Cardiovascular;  Laterality: Left;  tibial peroneal trunk and peroneal stents    stent  06/14/2010   left leg   STUMP REVISION Left 01/20/2019   Procedure: REVISION LEFT BELOW KNEE AMPUTATION;  Surgeon: [name], MD;  Location: [place];  Service: Orthopedics;  Laterality: Left;   US ECHOCARDIOGRAPHY   01/21/2006   moderate mitral annular ca+, mild MR, AOV moderately sclerotic.    FAMHx:  Family History  Problem Relation Age of Onset   Cancer Mother    Heart attack Father    Stroke Father    Heart attack Brother    Heart attack Brother     SOCHx:   reports that he has quit smoking. He has quit using smokeless tobacco.  His smokeless tobacco use included chew. He reports that he does not drink alcohol and does not use drugs.  ALLERGIES:  Allergies  Allergen Reactions   Iodinated Diagnostic Agents Other (See Comments)    caused Fever   Dye Fdc Red [Red Dye] Hives    Tolerated red Docusate, Niferex   Hydralazine     ROS: Pertinent items noted in HPI and remainder of comprehensive ROS otherwise negative.  HOME MEDS: Current Outpatient Medications  Medication Sig Dispense Refill   acetaminophen (TYLENOL) 500 MG tablet Take 500 mg by mouth every 6 (six) hours as needed for headache (pain).     amLODipine (NORVASC) 5 MG tablet Take 5 mg by mouth daily.     apixaban (ELIQUIS) 2.5 MG TABS tablet TAKE 1 TABLET(2.5 MG) BY MOUTH TWICE DAILY 180 tablet 1   Ascorbic Acid (VITAMIN C) 1000 MG tablet Take 1,000 mg by mouth daily.      atorvastatin (LIPITOR) 40 MG tablet TAKE 1 TABLET(40 MG) BY MOUTH EVERY EVENING 90 tablet 3   betamethasone dipropionate 0.05 % cream Apply 1 application topically 2 (two) times daily.     carvedilol (COREG) 3.125 MG tablet TAKE 1 TABLET(3.125 MG) BY MOUTH TWICE DAILY WITH A MEAL 180 tablet 3   Cholecalciferol (VITAMIN D-3) 125 MCG (5000 UT) TABS Take 5,000 Units by mouth every evening.     clopidogrel (PLAVIX) 75 MG tablet TAKE 1 TABLET(75 MG) BY MOUTH DAILY WITH BREAKFAST 90 tablet 3   co-enzyme Q-10 30 MG capsule Take 100 mg by mouth daily.  doxazosin (CARDURA) 2 MG tablet TAKE 1 TABLET(2 MG) BY MOUTH DAILY 90 tablet 3   famotidine (PEPCID) 20 MG tablet Take 1 tablet (20 mg total) by mouth daily. 60 tablet 0   ferrous sulfate 325 (65 FE) MG tablet  Take 325 mg by mouth 2 (two) times daily with a meal.     glipiZIDE (GLUCOTROL) 10 MG tablet Take 1 tablet (10 mg total) by mouth daily before breakfast.     isosorbide mononitrate (IMDUR) 30 MG 24 hr tablet Take 1 tablet (30 mg total) by mouth daily. 30 tablet 1   latanoprost (XALATAN) 0.005 % ophthalmic solution Place 1 drop into both eyes at bedtime.     levothyroxine (SYNTHROID) 75 MCG tablet Take 75 mcg by mouth daily before breakfast.      nitroGLYCERIN (NITROSTAT) 0.4 MG SL tablet Place 1 tablet (0.4 mg total) under the tongue every 5 (five) minutes x 3 doses as needed for chest pain. 25 tablet 3   ONETOUCH ULTRA test strip 1 each 2 (two) times daily.     polyethylene glycol (MIRALAX) 17 g packet Take 17 g by mouth daily as needed for moderate constipation. 14 each 0   potassium chloride SA (KLOR-CON) 20 MEQ tablet Take 2 tablets (40 mEq total) by mouth daily. 60 tablet 3   pyridoxine (B-6) 100 MG tablet Take 100 mg by mouth daily.     traMADol (ULTRAM) 50 MG tablet Take 1 tablet (50 mg total) by mouth every 6 (six) hours as needed. 60 tablet 0   vitamin E 400 UNIT capsule Take 400 Units by mouth daily.     zinc gluconate 50 MG tablet Take 50 mg by mouth daily.     furosemide (LASIX) 40 MG tablet Take 2 tablets (80 mg total) by mouth daily. Take 2 tablets (80mg ) once daily in the morning. Can take an additional 1 tablet (40mg ) in the afternoon as needed for weight gain (3lbs in 1 day or 5lbs in 1 week). 60 tablet 2   No current facility-administered medications for this visit.    LABS/IMAGING: No results found for this or any previous visit (from the past 48 hour(s)). No results found.  VITALS: BP 130/66 (BP Location: Left Arm, Patient Position: Sitting, Cuff Size: Large)   Pulse (!) 52   Ht 5\' 11"  (1.803 m)   Wt 216 lb (98 kg)   BMI 30.13 kg/m   EXAM: General appearance: alert and no distress Lungs: clear to auscultation bilaterally Heart: regular rate and rhythm, S1, S2  normal and systolic murmur: systolic ejection 3/6, crescendo at 2nd right intercostal space Extremities: bilateral BKA's Skin: Skin color, texture, turgor normal. No rashes or lesions Neurologic: Mental status: Alert, oriented, thought content appropriate Psych: Pleasant  EKG: Deferred  ASSESSMENT: Chronic diastolic congestive heart failure Recent PAF - CHADSVASC score of 5 (on Eliquis) Moderate to severe aortic stenosis (01/2019) New RBBB/bifascicular block PAD status post Bilateral BKA Hypertension- controlled Dizziness Dyslipidemia Asymptomatic bradycardia CKD 4 Recurrent urinary tract infections   PLAN: 1.   Mr. Wieczorek has had progressive renal failure and is now followed by nephrology.  He has had recurrent urinary tract infections and is catheterized.  He has multiple comorbidities including significant PAD and bilateral BKA's.  Unfortunately he would not be considered a candidate for surgical AVR or transcatheter AVR likely at this point, particularly with worsening renal failure.  According to his son, the palliative care team will be visiting their house tomorrow to discuss options.  At this point I asked them if they had talked about any end-of-life issues or whether he had an advanced directive.  His son was not clear of that.  He said that he generally discussed that more with his sister.  I do think it is important for them to clarify his goals of care.  There just unfortunately is very little to offer him at this point.  Follow-up with me in 6 months or sooner if necessary.  Pixie Casino, MD, [facility], [place] Director of the Advanced Lipid Disorders &  Cardiovascular Risk Reduction Clinic Diplomate of the American Board of Clinical Lipidology Attending Cardiologist  Direct Dial: [phone]  Fax: [phone]  Website:  [email or web address] 11/13/2020, 3:47 PM

## 2020-11-13 NOTE — Patient Instructions (Signed)
Medication Instructions:  Your physician recommends that you continue on your current medications as directed. Please refer to the Current Medication list given to you today.  *If you need a refill on your cardiac medications before your next appointment, please call your pharmacy*  Follow-Up: At [facility], you and your health needs are our priority.  As part of our continuing mission to provide you with exceptional heart care, we have created designated Provider Care Teams.  These Care Teams include your primary Cardiologist (physician) and Advanced Practice Providers (APPs -  Physician Assistants and Nurse Practitioners) who all work together to provide you with the care you need, when you need it.  We recommend signing up for the patient portal called "MyChart".  Sign up information is provided on this After Visit Summary.  MyChart is used to connect with patients for Virtual Visits (Telemedicine).  Patients are able to view lab/test results, encounter notes, upcoming appointments, etc.  Non-urgent messages can be sent to your provider as well.   To learn more about what you can do with MyChart, go to [email or web address].    Your next appointment:   6 month(s)  The format for your next appointment:   In Person  Provider:   You may see Kenneth C Hilty, MD or one of the following Advanced Practice Providers on your designated Care Team:    Hao Meng, PA-C  Angela Duke, PA-C or   Krista Kroeger, PA-C    Other Instructions   

## 2020-11-14 DIAGNOSIS — N184 Chronic kidney disease, stage 4 (severe): Secondary | ICD-10-CM | POA: Diagnosis not present

## 2020-11-14 DIAGNOSIS — Z515 Encounter for palliative care: Secondary | ICD-10-CM | POA: Diagnosis not present

## 2020-11-14 DIAGNOSIS — I5032 Chronic diastolic (congestive) heart failure: Secondary | ICD-10-CM | POA: Diagnosis not present

## 2020-11-15 DIAGNOSIS — N184 Chronic kidney disease, stage 4 (severe): Secondary | ICD-10-CM | POA: Diagnosis not present

## 2020-11-15 DIAGNOSIS — I252 Old myocardial infarction: Secondary | ICD-10-CM | POA: Diagnosis not present

## 2020-11-15 DIAGNOSIS — E039 Hypothyroidism, unspecified: Secondary | ICD-10-CM | POA: Diagnosis not present

## 2020-11-15 DIAGNOSIS — I499 Cardiac arrhythmia, unspecified: Secondary | ICD-10-CM | POA: Diagnosis not present

## 2020-11-15 DIAGNOSIS — Z7901 Long term (current) use of anticoagulants: Secondary | ICD-10-CM | POA: Diagnosis not present

## 2020-11-15 DIAGNOSIS — E785 Hyperlipidemia, unspecified: Secondary | ICD-10-CM | POA: Diagnosis not present

## 2020-11-15 DIAGNOSIS — Z7984 Long term (current) use of oral hypoglycemic drugs: Secondary | ICD-10-CM | POA: Diagnosis not present

## 2020-11-15 DIAGNOSIS — Z89511 Acquired absence of right leg below knee: Secondary | ICD-10-CM | POA: Diagnosis not present

## 2020-11-15 DIAGNOSIS — E1151 Type 2 diabetes mellitus with diabetic peripheral angiopathy without gangrene: Secondary | ICD-10-CM | POA: Diagnosis not present

## 2020-11-15 DIAGNOSIS — G4730 Sleep apnea, unspecified: Secondary | ICD-10-CM | POA: Diagnosis not present

## 2020-11-15 DIAGNOSIS — I350 Nonrheumatic aortic (valve) stenosis: Secondary | ICD-10-CM | POA: Diagnosis not present

## 2020-11-15 DIAGNOSIS — Z87440 Personal history of urinary (tract) infections: Secondary | ICD-10-CM | POA: Diagnosis not present

## 2020-11-15 DIAGNOSIS — Z79891 Long term (current) use of opiate analgesic: Secondary | ICD-10-CM | POA: Diagnosis not present

## 2020-11-15 DIAGNOSIS — I4510 Unspecified right bundle-branch block: Secondary | ICD-10-CM | POA: Diagnosis not present

## 2020-11-15 DIAGNOSIS — I480 Paroxysmal atrial fibrillation: Secondary | ICD-10-CM | POA: Diagnosis not present

## 2020-11-15 DIAGNOSIS — H409 Unspecified glaucoma: Secondary | ICD-10-CM | POA: Diagnosis not present

## 2020-11-15 DIAGNOSIS — L89322 Pressure ulcer of left buttock, stage 2: Secondary | ICD-10-CM | POA: Diagnosis not present

## 2020-11-15 DIAGNOSIS — I5033 Acute on chronic diastolic (congestive) heart failure: Secondary | ICD-10-CM | POA: Diagnosis not present

## 2020-11-15 DIAGNOSIS — I130 Hypertensive heart and chronic kidney disease with heart failure and stage 1 through stage 4 chronic kidney disease, or unspecified chronic kidney disease: Secondary | ICD-10-CM | POA: Diagnosis not present

## 2020-11-15 DIAGNOSIS — Z466 Encounter for fitting and adjustment of urinary device: Secondary | ICD-10-CM | POA: Diagnosis not present

## 2020-11-15 DIAGNOSIS — E1122 Type 2 diabetes mellitus with diabetic chronic kidney disease: Secondary | ICD-10-CM | POA: Diagnosis not present

## 2020-11-15 DIAGNOSIS — Z7902 Long term (current) use of antithrombotics/antiplatelets: Secondary | ICD-10-CM | POA: Diagnosis not present

## 2020-11-15 DIAGNOSIS — R338 Other retention of urine: Secondary | ICD-10-CM | POA: Diagnosis not present

## 2020-11-15 DIAGNOSIS — L89312 Pressure ulcer of right buttock, stage 2: Secondary | ICD-10-CM | POA: Diagnosis not present

## 2020-11-24 DIAGNOSIS — I252 Old myocardial infarction: Secondary | ICD-10-CM | POA: Diagnosis not present

## 2020-11-24 DIAGNOSIS — I350 Nonrheumatic aortic (valve) stenosis: Secondary | ICD-10-CM | POA: Diagnosis not present

## 2020-11-24 DIAGNOSIS — Z87440 Personal history of urinary (tract) infections: Secondary | ICD-10-CM | POA: Diagnosis not present

## 2020-11-24 DIAGNOSIS — E785 Hyperlipidemia, unspecified: Secondary | ICD-10-CM | POA: Diagnosis not present

## 2020-11-24 DIAGNOSIS — E1151 Type 2 diabetes mellitus with diabetic peripheral angiopathy without gangrene: Secondary | ICD-10-CM | POA: Diagnosis not present

## 2020-11-24 DIAGNOSIS — L89312 Pressure ulcer of right buttock, stage 2: Secondary | ICD-10-CM | POA: Diagnosis not present

## 2020-11-24 DIAGNOSIS — I130 Hypertensive heart and chronic kidney disease with heart failure and stage 1 through stage 4 chronic kidney disease, or unspecified chronic kidney disease: Secondary | ICD-10-CM | POA: Diagnosis not present

## 2020-11-24 DIAGNOSIS — E039 Hypothyroidism, unspecified: Secondary | ICD-10-CM | POA: Diagnosis not present

## 2020-11-24 DIAGNOSIS — Z7984 Long term (current) use of oral hypoglycemic drugs: Secondary | ICD-10-CM | POA: Diagnosis not present

## 2020-11-24 DIAGNOSIS — G4730 Sleep apnea, unspecified: Secondary | ICD-10-CM | POA: Diagnosis not present

## 2020-11-24 DIAGNOSIS — N184 Chronic kidney disease, stage 4 (severe): Secondary | ICD-10-CM | POA: Diagnosis not present

## 2020-11-24 DIAGNOSIS — Z89511 Acquired absence of right leg below knee: Secondary | ICD-10-CM | POA: Diagnosis not present

## 2020-11-24 DIAGNOSIS — Z7901 Long term (current) use of anticoagulants: Secondary | ICD-10-CM | POA: Diagnosis not present

## 2020-11-24 DIAGNOSIS — L89322 Pressure ulcer of left buttock, stage 2: Secondary | ICD-10-CM | POA: Diagnosis not present

## 2020-11-24 DIAGNOSIS — Z466 Encounter for fitting and adjustment of urinary device: Secondary | ICD-10-CM | POA: Diagnosis not present

## 2020-11-24 DIAGNOSIS — E1122 Type 2 diabetes mellitus with diabetic chronic kidney disease: Secondary | ICD-10-CM | POA: Diagnosis not present

## 2020-11-24 DIAGNOSIS — I4510 Unspecified right bundle-branch block: Secondary | ICD-10-CM | POA: Diagnosis not present

## 2020-11-24 DIAGNOSIS — I480 Paroxysmal atrial fibrillation: Secondary | ICD-10-CM | POA: Diagnosis not present

## 2020-11-24 DIAGNOSIS — H409 Unspecified glaucoma: Secondary | ICD-10-CM | POA: Diagnosis not present

## 2020-11-24 DIAGNOSIS — Z7902 Long term (current) use of antithrombotics/antiplatelets: Secondary | ICD-10-CM | POA: Diagnosis not present

## 2020-11-24 DIAGNOSIS — Z79891 Long term (current) use of opiate analgesic: Secondary | ICD-10-CM | POA: Diagnosis not present

## 2020-11-24 DIAGNOSIS — R338 Other retention of urine: Secondary | ICD-10-CM | POA: Diagnosis not present

## 2020-11-24 DIAGNOSIS — I499 Cardiac arrhythmia, unspecified: Secondary | ICD-10-CM | POA: Diagnosis not present

## 2020-11-24 DIAGNOSIS — I5033 Acute on chronic diastolic (congestive) heart failure: Secondary | ICD-10-CM | POA: Diagnosis not present

## 2020-11-29 DIAGNOSIS — I4510 Unspecified right bundle-branch block: Secondary | ICD-10-CM | POA: Diagnosis not present

## 2020-11-29 DIAGNOSIS — E039 Hypothyroidism, unspecified: Secondary | ICD-10-CM | POA: Diagnosis not present

## 2020-11-29 DIAGNOSIS — Z7902 Long term (current) use of antithrombotics/antiplatelets: Secondary | ICD-10-CM | POA: Diagnosis not present

## 2020-11-29 DIAGNOSIS — L89322 Pressure ulcer of left buttock, stage 2: Secondary | ICD-10-CM | POA: Diagnosis not present

## 2020-11-29 DIAGNOSIS — L89312 Pressure ulcer of right buttock, stage 2: Secondary | ICD-10-CM | POA: Diagnosis not present

## 2020-11-29 DIAGNOSIS — E785 Hyperlipidemia, unspecified: Secondary | ICD-10-CM | POA: Diagnosis not present

## 2020-11-29 DIAGNOSIS — I252 Old myocardial infarction: Secondary | ICD-10-CM | POA: Diagnosis not present

## 2020-11-29 DIAGNOSIS — H409 Unspecified glaucoma: Secondary | ICD-10-CM | POA: Diagnosis not present

## 2020-11-29 DIAGNOSIS — Z79891 Long term (current) use of opiate analgesic: Secondary | ICD-10-CM | POA: Diagnosis not present

## 2020-11-29 DIAGNOSIS — E1151 Type 2 diabetes mellitus with diabetic peripheral angiopathy without gangrene: Secondary | ICD-10-CM | POA: Diagnosis not present

## 2020-11-29 DIAGNOSIS — I350 Nonrheumatic aortic (valve) stenosis: Secondary | ICD-10-CM | POA: Diagnosis not present

## 2020-11-29 DIAGNOSIS — I480 Paroxysmal atrial fibrillation: Secondary | ICD-10-CM | POA: Diagnosis not present

## 2020-11-29 DIAGNOSIS — G4730 Sleep apnea, unspecified: Secondary | ICD-10-CM | POA: Diagnosis not present

## 2020-11-29 DIAGNOSIS — I499 Cardiac arrhythmia, unspecified: Secondary | ICD-10-CM | POA: Diagnosis not present

## 2020-11-29 DIAGNOSIS — I130 Hypertensive heart and chronic kidney disease with heart failure and stage 1 through stage 4 chronic kidney disease, or unspecified chronic kidney disease: Secondary | ICD-10-CM | POA: Diagnosis not present

## 2020-11-29 DIAGNOSIS — Z466 Encounter for fitting and adjustment of urinary device: Secondary | ICD-10-CM | POA: Diagnosis not present

## 2020-11-29 DIAGNOSIS — Z87440 Personal history of urinary (tract) infections: Secondary | ICD-10-CM | POA: Diagnosis not present

## 2020-11-29 DIAGNOSIS — Z89511 Acquired absence of right leg below knee: Secondary | ICD-10-CM | POA: Diagnosis not present

## 2020-11-29 DIAGNOSIS — E1122 Type 2 diabetes mellitus with diabetic chronic kidney disease: Secondary | ICD-10-CM | POA: Diagnosis not present

## 2020-11-29 DIAGNOSIS — R338 Other retention of urine: Secondary | ICD-10-CM | POA: Diagnosis not present

## 2020-11-29 DIAGNOSIS — Z7984 Long term (current) use of oral hypoglycemic drugs: Secondary | ICD-10-CM | POA: Diagnosis not present

## 2020-11-29 DIAGNOSIS — N184 Chronic kidney disease, stage 4 (severe): Secondary | ICD-10-CM | POA: Diagnosis not present

## 2020-11-29 DIAGNOSIS — I5033 Acute on chronic diastolic (congestive) heart failure: Secondary | ICD-10-CM | POA: Diagnosis not present

## 2020-11-29 DIAGNOSIS — Z7901 Long term (current) use of anticoagulants: Secondary | ICD-10-CM | POA: Diagnosis not present

## 2020-11-30 ENCOUNTER — Other Ambulatory Visit: Payer: Self-pay | Admitting: *Deleted

## 2020-11-30 ENCOUNTER — Other Ambulatory Visit: Payer: Self-pay | Admitting: Physician Assistant

## 2020-11-30 NOTE — Telephone Encounter (Signed)
This is Dr. Hilty's pt 

## 2020-11-30 NOTE — Patient Outreach (Signed)
[facility]) Care Management  11/30/2020  [name] Feb 01, 1927 [id number]   Outgoing call placed to member's daughter.  She state member has been doing "really good" over the last few days.  He was seen by cardiology on 10/31 in regards to his CHF, AVR, and A-fib, family notified that member is not a surgical candidate and there is little more that can be done to help treat his symptoms.  They were encouraged to discuss end of life care.  Member is already active with [facility] Palliative care team, member and family not quite ready for hospice, will inform the palliative care team when they are ready.    Daughter made aware that chronic care management team from PCP office would be following member going forward, she verbalizes understanding.  Denies any urgent concerns, encouraged to contact this care manager with questions in the interim of waiting for initial visit from CCM team.  Will close case at this time.  [name], [place], MSN B and E [phone]

## 2020-12-01 ENCOUNTER — Other Ambulatory Visit: Payer: Self-pay | Admitting: Internal Medicine

## 2020-12-05 DIAGNOSIS — N170 Acute kidney failure with tubular necrosis: Secondary | ICD-10-CM | POA: Diagnosis not present

## 2020-12-05 DIAGNOSIS — Z79899 Other long term (current) drug therapy: Secondary | ICD-10-CM | POA: Diagnosis not present

## 2020-12-05 DIAGNOSIS — E1129 Type 2 diabetes mellitus with other diabetic kidney complication: Secondary | ICD-10-CM | POA: Diagnosis not present

## 2020-12-05 DIAGNOSIS — R809 Proteinuria, unspecified: Secondary | ICD-10-CM | POA: Diagnosis not present

## 2020-12-05 DIAGNOSIS — E1122 Type 2 diabetes mellitus with diabetic chronic kidney disease: Secondary | ICD-10-CM | POA: Diagnosis not present

## 2020-12-05 DIAGNOSIS — R3914 Feeling of incomplete bladder emptying: Secondary | ICD-10-CM | POA: Diagnosis not present

## 2020-12-05 DIAGNOSIS — N189 Chronic kidney disease, unspecified: Secondary | ICD-10-CM | POA: Diagnosis not present

## 2020-12-05 DIAGNOSIS — E559 Vitamin D deficiency, unspecified: Secondary | ICD-10-CM | POA: Diagnosis not present

## 2020-12-06 DIAGNOSIS — I130 Hypertensive heart and chronic kidney disease with heart failure and stage 1 through stage 4 chronic kidney disease, or unspecified chronic kidney disease: Secondary | ICD-10-CM | POA: Diagnosis not present

## 2020-12-06 DIAGNOSIS — Z7902 Long term (current) use of antithrombotics/antiplatelets: Secondary | ICD-10-CM | POA: Diagnosis not present

## 2020-12-06 DIAGNOSIS — I480 Paroxysmal atrial fibrillation: Secondary | ICD-10-CM | POA: Diagnosis not present

## 2020-12-06 DIAGNOSIS — N184 Chronic kidney disease, stage 4 (severe): Secondary | ICD-10-CM | POA: Diagnosis not present

## 2020-12-06 DIAGNOSIS — E1122 Type 2 diabetes mellitus with diabetic chronic kidney disease: Secondary | ICD-10-CM | POA: Diagnosis not present

## 2020-12-06 DIAGNOSIS — G4730 Sleep apnea, unspecified: Secondary | ICD-10-CM | POA: Diagnosis not present

## 2020-12-06 DIAGNOSIS — L89312 Pressure ulcer of right buttock, stage 2: Secondary | ICD-10-CM | POA: Diagnosis not present

## 2020-12-06 DIAGNOSIS — H409 Unspecified glaucoma: Secondary | ICD-10-CM | POA: Diagnosis not present

## 2020-12-06 DIAGNOSIS — Z7984 Long term (current) use of oral hypoglycemic drugs: Secondary | ICD-10-CM | POA: Diagnosis not present

## 2020-12-06 DIAGNOSIS — Z466 Encounter for fitting and adjustment of urinary device: Secondary | ICD-10-CM | POA: Diagnosis not present

## 2020-12-06 DIAGNOSIS — R338 Other retention of urine: Secondary | ICD-10-CM | POA: Diagnosis not present

## 2020-12-06 DIAGNOSIS — Z79891 Long term (current) use of opiate analgesic: Secondary | ICD-10-CM | POA: Diagnosis not present

## 2020-12-06 DIAGNOSIS — E785 Hyperlipidemia, unspecified: Secondary | ICD-10-CM | POA: Diagnosis not present

## 2020-12-06 DIAGNOSIS — E039 Hypothyroidism, unspecified: Secondary | ICD-10-CM | POA: Diagnosis not present

## 2020-12-06 DIAGNOSIS — Z89511 Acquired absence of right leg below knee: Secondary | ICD-10-CM | POA: Diagnosis not present

## 2020-12-06 DIAGNOSIS — L89322 Pressure ulcer of left buttock, stage 2: Secondary | ICD-10-CM | POA: Diagnosis not present

## 2020-12-06 DIAGNOSIS — I499 Cardiac arrhythmia, unspecified: Secondary | ICD-10-CM | POA: Diagnosis not present

## 2020-12-06 DIAGNOSIS — I252 Old myocardial infarction: Secondary | ICD-10-CM | POA: Diagnosis not present

## 2020-12-06 DIAGNOSIS — I5033 Acute on chronic diastolic (congestive) heart failure: Secondary | ICD-10-CM | POA: Diagnosis not present

## 2020-12-06 DIAGNOSIS — E1151 Type 2 diabetes mellitus with diabetic peripheral angiopathy without gangrene: Secondary | ICD-10-CM | POA: Diagnosis not present

## 2020-12-06 DIAGNOSIS — I350 Nonrheumatic aortic (valve) stenosis: Secondary | ICD-10-CM | POA: Diagnosis not present

## 2020-12-06 DIAGNOSIS — I4510 Unspecified right bundle-branch block: Secondary | ICD-10-CM | POA: Diagnosis not present

## 2020-12-06 DIAGNOSIS — Z7901 Long term (current) use of anticoagulants: Secondary | ICD-10-CM | POA: Diagnosis not present

## 2020-12-06 DIAGNOSIS — Z87440 Personal history of urinary (tract) infections: Secondary | ICD-10-CM | POA: Diagnosis not present

## 2020-12-09 ENCOUNTER — Inpatient Hospital Stay (HOSPITAL_COMMUNITY)
Admission: EM | Admit: 2020-12-09 | Discharge: 2020-12-14 | DRG: 698 | Disposition: A | Discharge status: Hospice | Payer: Medicare Other | Attending: Family Medicine | Admitting: Family Medicine

## 2020-12-09 ENCOUNTER — Encounter (HOSPITAL_COMMUNITY): Payer: Self-pay | Admitting: *Deleted

## 2020-12-09 ENCOUNTER — Other Ambulatory Visit: Payer: Self-pay

## 2020-12-09 ENCOUNTER — Emergency Department (HOSPITAL_COMMUNITY): Payer: Medicare Other

## 2020-12-09 DIAGNOSIS — Z823 Family history of stroke: Secondary | ICD-10-CM

## 2020-12-09 DIAGNOSIS — G4733 Obstructive sleep apnea (adult) (pediatric): Secondary | ICD-10-CM | POA: Diagnosis present

## 2020-12-09 DIAGNOSIS — Z79890 Hormone replacement therapy (postmenopausal): Secondary | ICD-10-CM

## 2020-12-09 DIAGNOSIS — E1122 Type 2 diabetes mellitus with diabetic chronic kidney disease: Secondary | ICD-10-CM | POA: Diagnosis not present

## 2020-12-09 DIAGNOSIS — G9341 Metabolic encephalopathy: Secondary | ICD-10-CM | POA: Diagnosis present

## 2020-12-09 DIAGNOSIS — I350 Nonrheumatic aortic (valve) stenosis: Secondary | ICD-10-CM | POA: Diagnosis present

## 2020-12-09 DIAGNOSIS — J9801 Acute bronchospasm: Secondary | ICD-10-CM | POA: Diagnosis present

## 2020-12-09 DIAGNOSIS — R1310 Dysphagia, unspecified: Secondary | ICD-10-CM | POA: Diagnosis present

## 2020-12-09 DIAGNOSIS — J90 Pleural effusion, not elsewhere classified: Secondary | ICD-10-CM | POA: Diagnosis not present

## 2020-12-09 DIAGNOSIS — Y846 Urinary catheterization as the cause of abnormal reaction of the patient, or of later complication, without mention of misadventure at the time of the procedure: Secondary | ICD-10-CM | POA: Diagnosis present

## 2020-12-09 DIAGNOSIS — E871 Hypo-osmolality and hyponatremia: Secondary | ICD-10-CM | POA: Diagnosis not present

## 2020-12-09 DIAGNOSIS — G9340 Encephalopathy, unspecified: Secondary | ICD-10-CM

## 2020-12-09 DIAGNOSIS — Z888 Allergy status to other drugs, medicaments and biological substances status: Secondary | ICD-10-CM

## 2020-12-09 DIAGNOSIS — R627 Adult failure to thrive: Secondary | ICD-10-CM | POA: Diagnosis present

## 2020-12-09 DIAGNOSIS — D649 Anemia, unspecified: Secondary | ICD-10-CM | POA: Diagnosis present

## 2020-12-09 DIAGNOSIS — E860 Dehydration: Secondary | ICD-10-CM

## 2020-12-09 DIAGNOSIS — Z91041 Radiographic dye allergy status: Secondary | ICD-10-CM

## 2020-12-09 DIAGNOSIS — J9620 Acute and chronic respiratory failure, unspecified whether with hypoxia or hypercapnia: Secondary | ICD-10-CM | POA: Diagnosis present

## 2020-12-09 DIAGNOSIS — N179 Acute kidney failure, unspecified: Secondary | ICD-10-CM | POA: Diagnosis not present

## 2020-12-09 DIAGNOSIS — Z66 Do not resuscitate: Secondary | ICD-10-CM | POA: Diagnosis not present

## 2020-12-09 DIAGNOSIS — R509 Fever, unspecified: Secondary | ICD-10-CM | POA: Diagnosis not present

## 2020-12-09 DIAGNOSIS — E785 Hyperlipidemia, unspecified: Secondary | ICD-10-CM | POA: Diagnosis present

## 2020-12-09 DIAGNOSIS — T83511A Infection and inflammatory reaction due to indwelling urethral catheter, initial encounter: Principal | ICD-10-CM | POA: Diagnosis present

## 2020-12-09 DIAGNOSIS — R0602 Shortness of breath: Secondary | ICD-10-CM

## 2020-12-09 DIAGNOSIS — N185 Chronic kidney disease, stage 5: Secondary | ICD-10-CM | POA: Diagnosis not present

## 2020-12-09 DIAGNOSIS — R279 Unspecified lack of coordination: Secondary | ICD-10-CM | POA: Diagnosis not present

## 2020-12-09 DIAGNOSIS — M1990 Unspecified osteoarthritis, unspecified site: Secondary | ICD-10-CM | POA: Diagnosis present

## 2020-12-09 DIAGNOSIS — I739 Peripheral vascular disease, unspecified: Secondary | ICD-10-CM | POA: Diagnosis present

## 2020-12-09 DIAGNOSIS — I5032 Chronic diastolic (congestive) heart failure: Secondary | ICD-10-CM | POA: Diagnosis not present

## 2020-12-09 DIAGNOSIS — I252 Old myocardial infarction: Secondary | ICD-10-CM | POA: Diagnosis not present

## 2020-12-09 DIAGNOSIS — E1151 Type 2 diabetes mellitus with diabetic peripheral angiopathy without gangrene: Secondary | ICD-10-CM | POA: Diagnosis not present

## 2020-12-09 DIAGNOSIS — R404 Transient alteration of awareness: Secondary | ICD-10-CM | POA: Diagnosis not present

## 2020-12-09 DIAGNOSIS — Z6830 Body mass index (BMI) 30.0-30.9, adult: Secondary | ICD-10-CM | POA: Diagnosis not present

## 2020-12-09 DIAGNOSIS — Z7189 Other specified counseling: Secondary | ICD-10-CM | POA: Diagnosis not present

## 2020-12-09 DIAGNOSIS — Z89512 Acquired absence of left leg below knee: Secondary | ICD-10-CM

## 2020-12-09 DIAGNOSIS — J181 Lobar pneumonia, unspecified organism: Secondary | ICD-10-CM | POA: Diagnosis present

## 2020-12-09 DIAGNOSIS — Z993 Dependence on wheelchair: Secondary | ICD-10-CM

## 2020-12-09 DIAGNOSIS — Z79899 Other long term (current) drug therapy: Secondary | ICD-10-CM

## 2020-12-09 DIAGNOSIS — Z7401 Bed confinement status: Secondary | ICD-10-CM | POA: Diagnosis not present

## 2020-12-09 DIAGNOSIS — Z7901 Long term (current) use of anticoagulants: Secondary | ICD-10-CM

## 2020-12-09 DIAGNOSIS — J690 Pneumonitis due to inhalation of food and vomit: Secondary | ICD-10-CM | POA: Diagnosis not present

## 2020-12-09 DIAGNOSIS — R0902 Hypoxemia: Secondary | ICD-10-CM | POA: Diagnosis not present

## 2020-12-09 DIAGNOSIS — J189 Pneumonia, unspecified organism: Secondary | ICD-10-CM | POA: Diagnosis not present

## 2020-12-09 DIAGNOSIS — I132 Hypertensive heart and chronic kidney disease with heart failure and with stage 5 chronic kidney disease, or end stage renal disease: Secondary | ICD-10-CM | POA: Diagnosis not present

## 2020-12-09 DIAGNOSIS — N390 Urinary tract infection, site not specified: Secondary | ICD-10-CM | POA: Diagnosis present

## 2020-12-09 DIAGNOSIS — Z87891 Personal history of nicotine dependence: Secondary | ICD-10-CM

## 2020-12-09 DIAGNOSIS — N3000 Acute cystitis without hematuria: Secondary | ICD-10-CM | POA: Diagnosis not present

## 2020-12-09 DIAGNOSIS — N184 Chronic kidney disease, stage 4 (severe): Secondary | ICD-10-CM | POA: Diagnosis not present

## 2020-12-09 DIAGNOSIS — E039 Hypothyroidism, unspecified: Secondary | ICD-10-CM | POA: Diagnosis present

## 2020-12-09 DIAGNOSIS — Z7902 Long term (current) use of antithrombotics/antiplatelets: Secondary | ICD-10-CM

## 2020-12-09 DIAGNOSIS — Z7984 Long term (current) use of oral hypoglycemic drugs: Secondary | ICD-10-CM

## 2020-12-09 DIAGNOSIS — R54 Age-related physical debility: Secondary | ICD-10-CM | POA: Diagnosis present

## 2020-12-09 DIAGNOSIS — N401 Enlarged prostate with lower urinary tract symptoms: Secondary | ICD-10-CM | POA: Diagnosis present

## 2020-12-09 DIAGNOSIS — Z515 Encounter for palliative care: Secondary | ICD-10-CM | POA: Diagnosis not present

## 2020-12-09 DIAGNOSIS — I517 Cardiomegaly: Secondary | ICD-10-CM | POA: Diagnosis not present

## 2020-12-09 DIAGNOSIS — L8990 Pressure ulcer of unspecified site, unspecified stage: Secondary | ICD-10-CM | POA: Diagnosis present

## 2020-12-09 DIAGNOSIS — I5030 Unspecified diastolic (congestive) heart failure: Secondary | ICD-10-CM | POA: Diagnosis not present

## 2020-12-09 DIAGNOSIS — Z8249 Family history of ischemic heart disease and other diseases of the circulatory system: Secondary | ICD-10-CM

## 2020-12-09 DIAGNOSIS — I480 Paroxysmal atrial fibrillation: Secondary | ICD-10-CM | POA: Diagnosis not present

## 2020-12-09 DIAGNOSIS — Z743 Need for continuous supervision: Secondary | ICD-10-CM | POA: Diagnosis not present

## 2020-12-09 DIAGNOSIS — Z20822 Contact with and (suspected) exposure to covid-19: Secondary | ICD-10-CM | POA: Diagnosis present

## 2020-12-09 DIAGNOSIS — H409 Unspecified glaucoma: Secondary | ICD-10-CM | POA: Diagnosis present

## 2020-12-09 DIAGNOSIS — Z89511 Acquired absence of right leg below knee: Secondary | ICD-10-CM

## 2020-12-09 DIAGNOSIS — T83511D Infection and inflammatory reaction due to indwelling urethral catheter, subsequent encounter: Secondary | ICD-10-CM | POA: Diagnosis not present

## 2020-12-09 LAB — COMPREHENSIVE METABOLIC PANEL
ALT: 20 U/L (ref 0–44)
AST: 76 U/L — ABNORMAL HIGH (ref 15–41)
Albumin: 3.4 g/dL — ABNORMAL LOW (ref 3.5–5.0)
Alkaline Phosphatase: 35 U/L — ABNORMAL LOW (ref 38–126)
Anion gap: 12 (ref 5–15)
BUN: 58 mg/dL — ABNORMAL HIGH (ref 8–23)
CO2: 19 mmol/L — ABNORMAL LOW (ref 22–32)
Calcium: 8.8 mg/dL — ABNORMAL LOW (ref 8.9–10.3)
Chloride: 99 mmol/L (ref 98–111)
Creatinine, Ser: 3.86 mg/dL — ABNORMAL HIGH (ref 0.61–1.24)
GFR, Estimated: 14 mL/min — ABNORMAL LOW (ref >60)
Glucose, Bld: 138 mg/dL — ABNORMAL HIGH (ref 70–99)
Potassium: 3.8 mmol/L (ref 3.5–5.1)
Sodium: 130 mmol/L — ABNORMAL LOW (ref 135–145)
Total Bilirubin: 0.7 mg/dL (ref 0.3–1.2)
Total Protein: 7.2 g/dL (ref 6.5–8.1)

## 2020-12-09 LAB — GLUCOSE, CAPILLARY
Glucose-Capillary: 103 mg/dL — ABNORMAL HIGH (ref 70–99)
Glucose-Capillary: 88 mg/dL (ref 70–99)

## 2020-12-09 LAB — CBC WITH DIFFERENTIAL/PLATELET
Abs Immature Granulocytes: 0.11 10*3/uL — ABNORMAL HIGH (ref 0.00–0.07)
Basophils Absolute: 0.1 10*3/uL (ref 0.0–0.1)
Basophils Relative: 0 %
Eosinophils Absolute: 0 10*3/uL (ref 0.0–0.5)
Eosinophils Relative: 0 %
HCT: 31.7 % — ABNORMAL LOW (ref 39.0–52.0)
Hemoglobin: 10.8 g/dL — ABNORMAL LOW (ref 13.0–17.0)
Immature Granulocytes: 1 %
Lymphocytes Relative: 9 %
Lymphs Abs: 2 10*3/uL (ref 0.7–4.0)
MCH: 34.5 pg — ABNORMAL HIGH (ref 26.0–34.0)
MCHC: 34.1 g/dL (ref 30.0–36.0)
MCV: 101.3 fL — ABNORMAL HIGH (ref 80.0–100.0)
Monocytes Absolute: 1.2 10*3/uL — ABNORMAL HIGH (ref 0.1–1.0)
Monocytes Relative: 5 %
Neutro Abs: 18.8 10*3/uL — ABNORMAL HIGH (ref 1.7–7.7)
Neutrophils Relative %: 85 %
Platelets: 161 10*3/uL (ref 150–400)
RBC: 3.13 MIL/uL — ABNORMAL LOW (ref 4.22–5.81)
RDW: 14.5 % (ref 11.5–15.5)
WBC: 22.1 10*3/uL — ABNORMAL HIGH (ref 4.0–10.5)
nRBC: 0 % (ref 0.0–0.2)

## 2020-12-09 LAB — URINALYSIS, ROUTINE W REFLEX MICROSCOPIC
Bilirubin Urine: NEGATIVE
Glucose, UA: NEGATIVE mg/dL
Ketones, ur: NEGATIVE mg/dL
Nitrite: NEGATIVE
Protein, ur: 30 mg/dL — AB
Specific Gravity, Urine: 1.025 (ref 1.005–1.030)
pH: 5.5 (ref 5.0–8.0)

## 2020-12-09 LAB — URINALYSIS, MICROSCOPIC (REFLEX)

## 2020-12-09 LAB — RESP PANEL BY RT-PCR (FLU A&B, COVID) ARPGX2
Influenza A by PCR: NEGATIVE
Influenza B by PCR: NEGATIVE
SARS Coronavirus 2 by RT PCR: NEGATIVE

## 2020-12-09 LAB — CBG MONITORING, ED: Glucose-Capillary: 126 mg/dL — ABNORMAL HIGH (ref 70–99)

## 2020-12-09 LAB — LACTIC ACID, PLASMA: Lactic Acid, Venous: 1.6 mmol/L (ref 0.5–1.9)

## 2020-12-09 LAB — PROTIME-INR
INR: 1.6 — ABNORMAL HIGH (ref 0.8–1.2)
Prothrombin Time: 18.9 seconds — ABNORMAL HIGH (ref 11.4–15.2)

## 2020-12-09 MED ORDER — SODIUM CHLORIDE 0.9 % IV SOLN
1.0000 g | Freq: Once | INTRAVENOUS | Status: AC
  Administered 2020-12-09: 1 g via INTRAVENOUS
  Filled 2020-12-09: qty 1

## 2020-12-09 MED ORDER — HEPARIN SODIUM (PORCINE) 5000 UNIT/ML IJ SOLN
5000.0000 [IU] | Freq: Three times a day (TID) | INTRAMUSCULAR | Status: DC
  Administered 2020-12-09 – 2020-12-10 (×3): 5000 [IU] via SUBCUTANEOUS
  Filled 2020-12-09 (×3): qty 1

## 2020-12-09 MED ORDER — SODIUM CHLORIDE 0.9 % IV SOLN
500.0000 mg | INTRAVENOUS | Status: DC
  Administered 2020-12-09 – 2020-12-11 (×3): 500 mg via INTRAVENOUS
  Filled 2020-12-09 (×3): qty 500

## 2020-12-09 MED ORDER — INSULIN ASPART 100 UNIT/ML IJ SOLN
0.0000 [IU] | INTRAMUSCULAR | Status: DC
  Administered 2020-12-09: 1 [IU] via SUBCUTANEOUS
  Filled 2020-12-09: qty 1

## 2020-12-09 MED ORDER — SODIUM CHLORIDE 0.9 % IV BOLUS
1000.0000 mL | Freq: Once | INTRAVENOUS | Status: AC
  Administered 2020-12-09: 1000 mL via INTRAVENOUS

## 2020-12-09 MED ORDER — SODIUM CHLORIDE 0.9 % IV SOLN
500.0000 mg | Freq: Two times a day (BID) | INTRAVENOUS | Status: AC
  Administered 2020-12-10 – 2020-12-13 (×8): 500 mg via INTRAVENOUS
  Filled 2020-12-09 (×9): qty 0.5

## 2020-12-09 MED ORDER — SODIUM CHLORIDE 0.9 % IV SOLN: INTRAVENOUS | Status: DC

## 2020-12-09 MED ORDER — LACTATED RINGERS IV BOLUS
1000.0000 mL | Freq: Once | INTRAVENOUS | Status: AC
  Administered 2020-12-09: 1000 mL via INTRAVENOUS

## 2020-12-09 NOTE — ED Notes (Signed)
Family states that catheter was changed on 11/22.  Catheter changed again today to get clean urine sample

## 2020-12-09 NOTE — H&P (Signed)
TRH H&P   Patient Demographics:    [name], is a 87 y.o. male  MRN: [id number]   DOB - 1927/10/16  Admit Date - 12/09/2020  Outpatient Primary MD for the patient is Redmond School, MD  Referring MD/NP/PA: Dr Alvino Chapel   Patient coming from: home  Chief Complaint  Patient presents with   Fever      HPI:    [name]  is a 87 y.o. male,  WM PMHx DM type II uncontrolled with complication, HLD, s/p bilateral lower extremity BKA, chronic diastolic CHF, moderate to severe aortic stenosis, s/p NSTEMI, HTN, hypothyroidism, OSA (not on CPAP), urinary retention secondary to BPH indwelling Foley catheter.  CKD stage IV, patient with recent hospitalization due to altered mental status from ESBL UTI, he is discharged on 10/15/2020. -Patient was brought by his son due to decreased mental status, decreased responsiveness, symptoms has been ongoing for last 48 hours, decreased oral Intake and appetite, Foley catheter was exchanged before few days, and with multiple UTIs in the past requiring IV antibiotics, and himself cannot provide any complaints. -In ED patient was noted altered, not communicative which is far from his baseline per son, work-up significant for sodium of 133, creatinine 3.8 around baseline, white blood cell count of 22,000, lactic acid within normal limit at 1.6, UA is positive, chest x-ray significant for right lower lobe infiltrate, [facility]ist consulted to admit    Review of systems:  Unable to obtain due to altered mental status   With Past History of the following :    Past Medical History:  Diagnosis Date   Arthritis    Borderline diabetic    CHF (congestive heart failure) ([place])    Chronic kidney disease    Diabetes mellitus without complication ([place])    Dysrhythmia    Glaucoma    Hyperlipidemia    Hypertension    Hypothyroidism    Myocardial  infarction ([place])    PVD (peripheral vascular disease) ([place])    Sleep apnea    Cpap ordered but doesnt use   Urinary retention due to benign prostatic hyperplasia 12/28/2018      Past Surgical History:  Procedure Laterality Date   ABDOMINAL AORTOGRAM W/LOWER EXTREMITY N/A 07/27/2018   Procedure: ABDOMINAL AORTOGRAM W/LOWER EXTREMITY;  Surgeon: Lorretta Harp, MD;  Location: [facility];  Service: Cardiovascular;  Laterality: N/A;   AMPUTATION Right 08/28/2018   Procedure: RIGHT BELOW KNEE AMPUTATION;  Surgeon: [name], MD;  Location: [place];  Service: Orthopedics;  Laterality: Right;   AMPUTATION Left 12/02/2018   Procedure: LEFT TRANSMETATARSAL AMPUTATION AND NEGATIVE PRESSURE WOUND VAC PLACEMENT;  Surgeon: [name], MD;  Location: [employer];  Service: Orthopedics;  Laterality: Left;   AMPUTATION Left 12/18/2018   Procedure: LEFT BELOW KNEE AMPUTATION;  Surgeon: [name], MD;  Location: [facility];  Service: Orthopedics;  Laterality:  Left;   APPENDECTOMY  1943   BELOW KNEE LEG AMPUTATION Left 12/18/2018   CATARACT EXTRACTION  2012   x2   COLONOSCOPY N/A 11/01/2014   Procedure: COLONOSCOPY;  Surgeon: Aviva Signs, MD;  [facility];  Service: Gastroenterology;  Laterality: N/A;   ESOPHAGOGASTRODUODENOSCOPY N/A 11/01/2014   Procedure: ESOPHAGOGASTRODUODENOSCOPY (EGD);  Surgeon: Aviva Signs, MD;  [facility];  Service: Gastroenterology;  Laterality: N/A;   HERNIA REPAIR Left    INGUINAL HERNIA REPAIR Right 03/01/2013   Procedure: RIGHT INGUINAL HERNIORRHAPHY;  Surgeon: Jamesetta So, MD;  Location: [facility];  Service: General;  Laterality: Right;   INSERTION OF MESH Right 03/01/2013   Procedure: INSERTION OF MESH;  Surgeon: Jamesetta So, MD;  Location: [facility];  Service: General;  Laterality: Right;   LOWER EXTREMITY ANGIOGRAPHY Right 08/25/2018   Procedure: LOWER EXTREMITY ANGIOGRAPHY;  Surgeon: Wellington Hampshire, MD;  Location: [facility];   Service: Cardiovascular;  Laterality: Right;   LOWER EXTREMITY ANGIOGRAPHY N/A 11/25/2018   Procedure: LOWER EXTREMITY ANGIOGRAPHY;  Surgeon: Wellington Hampshire, MD;  Location: [facility];  Service: Cardiovascular;  Laterality: N/A;   NM MYOCAR PERF WALL MOTION  06/05/2010   no significant ischemia   PERIPHERAL VASCULAR ATHERECTOMY Right 08/03/2018   Procedure: PERIPHERAL VASCULAR ATHERECTOMY;  Surgeon: Lorretta Harp, MD;  Location: [facility];  Service: Cardiovascular;  Laterality: Right;   PERIPHERAL VASCULAR ATHERECTOMY Left 11/23/2018   Procedure: PERIPHERAL VASCULAR ATHERECTOMY;  Surgeon: Lorretta Harp, MD;  Location: [facility];  Service: Cardiovascular;  Laterality: Left;  sfa with dc balloon   PERIPHERAL VASCULAR BALLOON ANGIOPLASTY Left 11/25/2018   Procedure: PERIPHERAL VASCULAR BALLOON ANGIOPLASTY;  Surgeon: Wellington Hampshire, MD;  Location: [facility];  Service: Cardiovascular;  Laterality: Left;  popliteal   PERIPHERAL VASCULAR INTERVENTION Left 11/25/2018   Procedure: PERIPHERAL VASCULAR INTERVENTION;  Surgeon: Wellington Hampshire, MD;  Location: [facility];  Service: Cardiovascular;  Laterality: Left;  tibial peroneal trunk and peroneal stents    stent  06/14/2010   left leg   STUMP REVISION Left 01/20/2019   Procedure: REVISION LEFT BELOW KNEE AMPUTATION;  Surgeon: [name], MD;  Location: [facility];  Service: Orthopedics;  Laterality: Left;   US ECHOCARDIOGRAPHY  01/21/2006   moderate mitral annular ca+, mild MR, AOV moderately sclerotic.      Social History:     Social History   Tobacco Use   Smoking status: Former   Smokeless tobacco: Former    Types: Adult nurse Topics   Alcohol use: No     Lives -lives at home, has 2 sons and daughter help to take care of him at home  Mobility -wheelchair dependent     Family History :     Family History  Problem Relation Age of Onset   Cancer Mother    Heart attack  Father    Stroke Father    Heart attack Brother    Heart attack Brother       Home Medications:   Prior to Admission medications   Medication Sig Start Date End Date Taking? Authorizing Provider  acetaminophen (TYLENOL) 500 MG tablet Take 500 mg by mouth every 6 (six) hours as needed for headache (pain).   Yes [provider]  amLODipine (NORVASC) 5 MG tablet Take 5 mg by mouth daily. 07/24/20  Yes [provider]  apixaban (ELIQUIS) 2.5 MG TABS  tablet TAKE 1 TABLET(2.5 MG) BY MOUTH TWICE DAILY 10/05/20  Yes Almyra Deforest, [place]  Ascorbic Acid (VITAMIN C) 1000 MG tablet Take 1,000 mg by mouth daily.    Yes [provider]  atorvastatin (LIPITOR) 40 MG tablet TAKE 1 TABLET(40 MG) BY MOUTH EVERY EVENING 07/19/20  Yes Hilty, Nadean Corwin, MD  carvedilol (COREG) 3.125 MG tablet TAKE 1 TABLET(3.125 MG) BY MOUTH TWICE DAILY WITH A MEAL 09/15/20  Yes Hilty, Nadean Corwin, MD  Cholecalciferol (VITAMIN D-3) 125 MCG (5000 UT) TABS Take 5,000 Units by mouth every evening.   Yes [provider]  clopidogrel (PLAVIX) 75 MG tablet TAKE 1 TABLET(75 MG) BY MOUTH DAILY WITH BREAKFAST 08/28/20  Yes Hilty, Nadean Corwin, MD  co-enzyme Q-10 30 MG capsule Take 100 mg by mouth daily.   Yes [provider]  doxazosin (CARDURA) 2 MG tablet TAKE 1 TABLET(2 MG) BY MOUTH DAILY 10/26/20  Yes Lendon Colonel, NP  famotidine (PEPCID) 20 MG tablet Take 1 tablet (20 mg total) by mouth daily. 11/12/18  Yes Johnson, Clanford L, MD  ferrous sulfate 325 (65 FE) MG tablet Take 325 mg by mouth 2 (two) times daily with a meal.   Yes [provider]  furosemide (LASIX) 40 MG tablet Take 2 tablets (80 mg total) by mouth daily. Take 2 tablets (80mg ) once daily in the morning. Can take an additional 1 tablet (40mg ) in the afternoon as needed for weight gain (3lbs in 1 day or 5lbs in 1 week). Patient taking differently: Take 40 mg by mouth 2 (two) times daily. Take 2 tablets (80mg ) once daily in the  morning. Can take an additional 1 tablet (40mg ) in the afternoon as needed for weight gain (3lbs in 1 day or 5lbs in 1 week). 06/19/20 12/09/20 Yes Barrett, Evelene Croon, PA-C  glipiZIDE (GLUCOTROL) 10 MG tablet Take 1 tablet (10 mg total) by mouth daily before breakfast. 10/15/20  Yes Barton Dubois, MD  isosorbide mononitrate (IMDUR) 30 MG 24 hr tablet Take 1 tablet (30 mg total) by mouth daily. 10/15/20  Yes Barton Dubois, MD  latanoprost (XALATAN) 0.005 % ophthalmic solution Place 1 drop into both eyes at bedtime. 09/30/19  Yes [provider]  levothyroxine (SYNTHROID) 75 MCG tablet Take 75 mcg by mouth daily before breakfast.  02/12/19  Yes [provider]  nitroGLYCERIN (NITROSTAT) 0.4 MG SL tablet Place 1 tablet (0.4 mg total) under the tongue every 5 (five) minutes x 3 doses as needed for chest pain. 06/19/20  Yes Barrett, Evelene Croon, PA-C  polyethylene glycol (MIRALAX) 17 g packet Take 17 g by mouth daily as needed for moderate constipation. 12/09/18  Yes Mercy Riding, MD  potassium chloride SA (KLOR-CON) 20 MEQ tablet Take 2 tablets (40 mEq total) by mouth daily. 06/20/20  Yes Barrett, Evelene Croon, PA-C  pyridoxine (B-6) 100 MG tablet Take 100 mg by mouth daily.   Yes [provider]  traMADol (ULTRAM) 50 MG tablet Take 1 tablet (50 mg total) by mouth every 6 (six) hours as needed. 02/15/19  Yes Lovorn, Megan, MD  vitamin E 400 UNIT capsule Take 400 Units by mouth daily.   Yes [provider]  zinc gluconate 50 MG tablet Take 50 mg by mouth daily.   Yes [provider]  betamethasone dipropionate 0.05 % cream Apply 1 application topically 2 (two) times daily. Patient not taking: Reported on 12/09/2020 12/03/19   [provider]  [facility] ULTRA test strip 1 each 2 (two)  times daily. 04/15/19   [provider]     Allergies:     Allergies  Allergen Reactions   Iodinated Diagnostic Agents Other (See Comments)    caused Fever   Dye Fdc Red [Red  Dye] Hives    Tolerated red Docusate, Niferex   Hydralazine      Physical Exam:   Vitals  Blood pressure 112/63, pulse (!) 58, temperature (!) 100.6 F (38.1 C), temperature source Oral, resp. rate 19, height 5\' 11"  (1.803 m), weight 98 kg, SpO2 91 %.   1. General frail, chronically appearing male, laying in bed in no apparent distress  2.  Patient unable to answer any questions appropriately or follow any commands, only open eyes throughout verbal stimuli    3.  Moves extremities grossly without significant deficits, unable to perform an appropriate surgical exam due to mental status change  4. Ears and Eyes appear Normal, Conjunctivae clear, dry oral Mucosa.  5. Supple Neck, No JVD, No cervical lymphadenopathy appriciated, No Carotid Bruits.  6. Symmetrical Chest wall movement, Good air movement bilaterally, CTAB.  7. RRR, No Gallops, Rubs or Murmurs, No Parasternal Heave.  8. Positive Bowel Sounds, Abdomen Soft, No tenderness, No organomegaly appriciated,No rebound -guarding or rigidity.  9.  No Cyanosis, Normal Skin Turgor, No Skin Rash or Bruise.  10. Good muscle tone,  joints appear normal , no effusions, Normal ROM.  Bilateral BKA  11. No Palpable Lymph Nodes in Neck or Axillae    Data Review:    CBC Recent Labs  Lab 12/09/20 1243  WBC 22.1*  HGB 10.8*  HCT 31.7*  PLT 161  MCV 101.3*  MCH 34.5*  MCHC 34.1  RDW 14.5  LYMPHSABS 2.0  MONOABS 1.2*  EOSABS 0.0  BASOSABS 0.1   ------------------------------------------------------------------------------------------------------------------  Chemistries  Recent Labs  Lab 12/09/20 1243  NA 130*  K 3.8  CL 99  CO2 19*  GLUCOSE 138*  BUN 58*  CREATININE 3.86*  CALCIUM 8.8*  AST 76*  ALT 20  ALKPHOS 35*  BILITOT 0.7   ------------------------------------------------------------------------------------------------------------------ estimated creatinine clearance is 14.9 mL/min (A) (by C-G formula  based on SCr of 3.86 mg/dL (H)). ------------------------------------------------------------------------------------------------------------------ No results for input(s): TSH, T4TOTAL, T3FREE, THYROIDAB in the last 72 hours.  Invalid input(s): FREET3  Coagulation profile Recent Labs  Lab 12/09/20 1243  INR 1.6*   ------------------------------------------------------------------------------------------------------------------- No results for input(s): DDIMER in the last 72 hours. -------------------------------------------------------------------------------------------------------------------  Cardiac Enzymes No results for input(s): CKMB, TROPONINI, MYOGLOBIN in the last 168 hours.  Invalid input(s): CK ------------------------------------------------------------------------------------------------------------------    Component Value Date/Time   BNP 412.0 (H) 06/17/2020 0313     ---------------------------------------------------------------------------------------------------------------  Urinalysis    Component Value Date/Time   COLORURINE YELLOW 12/09/2020 1622   APPEARANCEUR HAZY (A) 12/09/2020 1622   LABSPEC 1.025 12/09/2020 1622   PHURINE 5.5 12/09/2020 1622   GLUCOSEU NEGATIVE 12/09/2020 1622   HGBUR MODERATE (A) 12/09/2020 1622   BILIRUBINUR NEGATIVE 12/09/2020 1622   KETONESUR NEGATIVE 12/09/2020 1622   PROTEINUR 30 (A) 12/09/2020 1622   NITRITE NEGATIVE 12/09/2020 1622   LEUKOCYTESUR MODERATE (A) 12/09/2020 1622    ----------------------------------------------------------------------------------------------------------------   Imaging Results:    DG Chest 1 View  Result Date: 12/09/2020 CLINICAL DATA:  Fever, suspected sepsis. History of multiple UTIs with recent Foley catheter change. EXAM: CHEST  1 VIEW COMPARISON:  Chest radiograph 10/11/2020 FINDINGS: Persistent low lung volumes. Unchanged confluent opacity at the left lung base compared to  10/11/2020 with layering pleural fluid. New  small patchy airspace opacity in the medial right lower lobe. No right pleural effusion. No pneumothorax. Stable cardiomegaly. No acute osseous abnormality. IMPRESSION: 1. New small patchy airspace opacity in the medial right lower lobe could represent infection versus aspiration related changes. 2. Unchanged confluent airspace opacity in the left lower lobe, favored to represent moderate effusion with left lower lobe atelectasis, though infection is not excluded. 3. Stable cardiomegaly. Electronically Signed   By: Ileana Roup M.D.   On: 12/09/2020 12:29      Assessment & Plan:    Principal Problem:   UTI (urinary tract infection) Active Problems:   Chronic diastolic CHF (congestive heart failure) (H[facility])   Acquired absence of right leg below knee (H[facility])   Chronic kidney disease (CKD), stage IV (severe) ([facility])   Acute metabolic encephalopathy   Acute UTI  Acute metabolic encephalopathy -This is due to infectious process, most likely CLINIgrip pneumonia and UTI -Keep n.p.o. for now  UTI related to Foley -Patient with recent hospital stay with a ESBL UTI, will continue with meropenem, Foley catheter changed in ED, follow on final urine cultures.  Pneumonia -Imaging significant for right lower lobe pneumonia, continue with meropenem, will add azithromycin for pneumonia coverage, follow on sputum cultures, Legionella and strep antigen. -Given its right lower lobe pneumonia will consult SLP for aspiration risk when patient mentation has improved  peripheral vascular disease -Status post bilateral BKA -Continue risk factor modification -resume  Eliquis, statins and Plavix when able to take oral   essential hypertension -blood pressure is soft, as well patient n.p.o., so we will keep holding Norvasc, Lasix, Imdur and Coreg   Hyponatremia -Due to volume depletion, should improve with holding Lasix and IV normal saline  chronic kidney disease a  stage IV -Appears to be at baseline   type 2 diabetes mellitus with nephropathy -A1c 6.6 during recent admission -Hold glipizide and keep on insulin sliding scale every 4 hours, changed to before meals when able to take oral  Hyperlipidemia -Resume statin when able to take oral   hypothyroidism  -resume synthroid when able to take oral   chronic diastolic HF -No evidence of volume overload load currently, continue with gentle IV fluids and hold Lasix.  chronic paroxysmal atrial fibrillation -Rate controlled currently -Resume Coreg and Eliquis when able to take oral     DVT Prophylaxis  SCDs , will resume Eliquis once passed swallow evaluation  AM Labs Ordered, also please review Full Orders  Family Communication: Admission, patients condition and plan of care including tests being ordered have been discussed with the son at bedside who indicate understanding and agree with the plan and Code Status.  Code Status DNR, no heroics, confirmed by son  Likely DC to  home  Condition GUARDED    Consults called: none    Admission status: inpatient  Time spent in minutes : 65 minutes   Phillips Climes M.D on 12/09/2020 at 5:52 PM   [facility] - Office  [phone]

## 2020-12-09 NOTE — ED Provider Notes (Signed)
[facility] EMERGENCY DEPARTMENT Provider Note   CSN: [id number] Arrival date & time: 12/09/20  1136     History Chief Complaint  Patient presents with   Fever    [name] is a 87 y.o. male.   Fever Level 5 caveat due to altered mental status. Patient brought in by family members for decreased mental status.  Decreased responsiveness.  Began yesterday.  Few days ago had Foley catheter changed.  Doing well the next day but then 2 days ago began to be more confused.  Reported fever at home of 99.4.  100.6 upon arrival.  Decreased oral intake.  History of multiple UTIs that per patient's family member can only be treated with IV antibiotics.  Patient is basically unresponsive for me at this time.  Nonverbal.  Patient is a DNR.  Would not want intubation or CPR.    Past Medical History:  Diagnosis Date   Arthritis    Borderline diabetic    CHF (congestive heart failure) (HCC)    Chronic kidney disease    Diabetes mellitus without complication ([place])    Dysrhythmia    Glaucoma    Hyperlipidemia    Hypertension    Hypothyroidism    Myocardial infarction [facility])    PVD (peripheral vascular disease) ([place])    Sleep apnea    Cpap ordered but doesnt use   Urinary retention due to benign prostatic hyperplasia 12/28/2018    Patient Active Problem List   Diagnosis Date Noted   COVID-19 virus infection 08/09/2020   Demand ischemia ([place]) 06/18/2020   Uncontrolled type 2 diabetes mellitus with hyperglycemia, without long-term current use of insulin ([place]) 02/25/2020   CKD (chronic kidney disease) stage 4, GFR 15-29 ml/min ([place]) 02/25/2020   Acute UTI    AMS (altered mental status) 02/24/2020   PNA (pneumonia) 12/06/2019   Sepsis due to PNA 86/16/8372   Acute metabolic encephalopathy 90/21/1155   AKI (acute kidney injury) [facility])    Aortic valve stenosis    Palliative care encounter    Acute kidney injury superimposed on CKD ([place]) 10/04/2019   Hypokalemia 10/04/2019   Weakness  generalized 04/05/2019   S/P BKA (below knee amputation) unilateral, left ([place]) 02/15/2019   Pressure injury of skin 02/03/2019   TIA (transient ischemic attack) 02/03/2019   Transient speech disturbance 02/02/2019   Chronic indwelling Foley catheter 01/22/2019   Postoperative wound infection 01/20/2019   Normocytic anemia 01/18/2019   Cellulitis 01/18/2019   Infection of deep incisional surgical site after procedure    Abnormal urinalysis    Labile blood glucose    Urinary retention    S/P bilateral BKA (below knee amputation) ([place])    Acute blood loss anemia    Urinary retention due to benign prostatic hyperplasia 12/28/2018   Unilateral complete BKA, left, initial encounter ([place]) 12/25/2018   Dehiscence of amputation stump ([place])    History of transmetatarsal amputation of left foot ([place]) 12/09/2018   Critical limb ischemia with history of revascularization of same extremity ([place])    Gangrene of left foot ([place])    Diabetic ulcer of toe of left foot associated with type 2 diabetes mellitus ([place])    Cellulitis of left lower extremity    Toe infection 11/18/2018   Peripheral edema    Acute on chronic diastolic CHF (congestive heart failure) ([place]) 11/09/2018   S/P BKA (below knee amputation) unilateral, right ([place]) 10/05/2018   Hyponatremia    Essential hypertension    Postoperative pain  Pain of left heel    Unable to maintain weight-bearing    Type 2 diabetes mellitus with diabetic peripheral angiopathy with gangrene (HCC)    Anemia due to acute blood loss    Chronic kidney disease (CKD), stage IV (severe) (HCC)    Acquired absence of right leg below knee ([place]) 09/07/2018   Gangrene of right foot ([place])    Goals of care, counseling/discussion    Palliative care by specialist    Type 2 diabetes mellitus with vascular disease ([place])    Severe protein-calorie malnutrition ([place])    Ischemic foot 08/20/2018   Critical lower limb ischemia ([place]) 08/03/2018   Type 2 diabetes  mellitus with foot ulcer, without long-term current use of insulin (HCC)    Gastroesophageal reflux disease    Cellulitis of great toe of right foot 05/29/2018   Atrial fibrillation, chronic    Chest pain 07/25/2017   PAF (paroxysmal atrial fibrillation) ([place]) 07/25/2017   BPH (benign prostatic hyperplasia) 07/25/2017   Shortness of breath 05/11/2017   Hypothyroidism 05/11/2017   Chronic diastolic CHF (congestive heart failure) ([place]) 05/11/2017   Moderate aortic stenosis 06/12/2016   RBBB 06/12/2016   Peripheral vascular disease ([place]) 08/04/2012   Essential hypertension, benign 08/04/2012   Hyperlipidemia 08/04/2012   Dizziness 08/04/2012    Past Surgical History:  Procedure Laterality Date   ABDOMINAL AORTOGRAM W/LOWER EXTREMITY N/A 07/27/2018   Procedure: ABDOMINAL AORTOGRAM W/LOWER EXTREMITY;  Surgeon: Lorretta Harp, MD;  Location: [place] CV [facility];  Service: Cardiovascular;  Laterality: N/A;   AMPUTATION Right 08/28/2018   Procedure: RIGHT BELOW KNEE AMPUTATION;  Surgeon: [name], MD;  Location: [place];  Service: Orthopedics;  Laterality: Right;   AMPUTATION Left 12/02/2018   Procedure: LEFT TRANSMETATARSAL AMPUTATION AND NEGATIVE PRESSURE WOUND VAC PLACEMENT;  Surgeon: [name], MD;  Location: [place];  Service: Orthopedics;  Laterality: Left;   AMPUTATION Left 12/18/2018   Procedure: LEFT BELOW KNEE AMPUTATION;  Surgeon: [name], MD;  Location: [place];  Service: Orthopedics;  Laterality: Left;   APPENDECTOMY  1943   BELOW KNEE LEG AMPUTATION Left 12/18/2018   CATARACT EXTRACTION  2012   x2   COLONOSCOPY N/A 11/01/2014   Procedure: COLONOSCOPY;  Surgeon: Aviva Signs, MD;  [facility];  Service: Gastroenterology;  Laterality: N/A;   ESOPHAGOGASTRODUODENOSCOPY N/A 11/01/2014   Procedure: ESOPHAGOGASTRODUODENOSCOPY (EGD);  Surgeon: Aviva Signs, MD;  [facility];  Service: Gastroenterology;  Laterality: N/A;   HERNIA REPAIR Left     INGUINAL HERNIA REPAIR Right 03/01/2013   Procedure: RIGHT INGUINAL HERNIORRHAPHY;  Surgeon: Jamesetta So, MD;  Location: [facility];  Service: General;  Laterality: Right;   INSERTION OF MESH Right 03/01/2013   Procedure: INSERTION OF MESH;  Surgeon: Jamesetta So, MD;  Location: [facility];  Service: General;  Laterality: Right;   LOWER EXTREMITY ANGIOGRAPHY Right 08/25/2018   Procedure: LOWER EXTREMITY ANGIOGRAPHY;  Surgeon: Wellington Hampshire, MD;  Location: [place] [facility];  Service: Cardiovascular;  Laterality: Right;   LOWER EXTREMITY ANGIOGRAPHY N/A 11/25/2018   Procedure: LOWER EXTREMITY ANGIOGRAPHY;  Surgeon: Wellington Hampshire, MD;  Location: [place] [facility];  Service: Cardiovascular;  Laterality: N/A;   NM MYOCAR PERF WALL MOTION  06/05/2010   no significant ischemia   PERIPHERAL VASCULAR ATHERECTOMY Right 08/03/2018   Procedure: PERIPHERAL VASCULAR ATHERECTOMY;  Surgeon: Lorretta Harp, MD;  Location: [place] [facility];  Service: Cardiovascular;  Laterality: Right;  PERIPHERAL VASCULAR ATHERECTOMY Left 11/23/2018   Procedure: PERIPHERAL VASCULAR ATHERECTOMY;  Surgeon: Lorretta Harp, MD;  Location: [place] [facility];  Service: Cardiovascular;  Laterality: Left;  sfa with dc balloon   PERIPHERAL VASCULAR BALLOON ANGIOPLASTY Left 11/25/2018   Procedure: PERIPHERAL VASCULAR BALLOON ANGIOPLASTY;  Surgeon: Wellington Hampshire, MD;  Location: [place] [facility];  Service: Cardiovascular;  Laterality: Left;  popliteal   PERIPHERAL VASCULAR INTERVENTION Left 11/25/2018   Procedure: PERIPHERAL VASCULAR INTERVENTION;  Surgeon: Wellington Hampshire, MD;  Location: [place] [facility];  Service: Cardiovascular;  Laterality: Left;  tibial peroneal trunk and peroneal stents    stent  06/14/2010   left leg   STUMP REVISION Left 01/20/2019   Procedure: REVISION LEFT BELOW KNEE AMPUTATION;  Surgeon: [name], MD;  Location: [place];  Service: Orthopedics;  Laterality: Left;   US ECHOCARDIOGRAPHY   01/21/2006   moderate mitral annular ca+, mild MR, AOV moderately sclerotic.       Family History  Problem Relation Age of Onset   Cancer Mother    Heart attack Father    Stroke Father    Heart attack Brother    Heart attack Brother     Social History   Tobacco Use   Smoking status: Former   Smokeless tobacco: Former    Types: Nurse, children's Use: Never used  Substance Use Topics   Alcohol use: No   Drug use: No    Home Medications Prior to Admission medications   Medication Sig Start Date End Date Taking? Authorizing Provider  acetaminophen (TYLENOL) 500 MG tablet Take 500 mg by mouth every 6 (six) hours as needed for headache (pain).   Yes [provider]  amLODipine (NORVASC) 5 MG tablet Take 5 mg by mouth daily. 07/24/20  Yes [provider]  apixaban (ELIQUIS) 2.5 MG TABS tablet TAKE 1 TABLET(2.5 MG) BY MOUTH TWICE DAILY 10/05/20  Yes Almyra Deforest, PA  Ascorbic Acid (VITAMIN C) 1000 MG tablet Take 1,000 mg by mouth daily.    Yes [provider]  atorvastatin (LIPITOR) 40 MG tablet TAKE 1 TABLET(40 MG) BY MOUTH EVERY EVENING 07/19/20  Yes Hilty, Nadean Corwin, MD  carvedilol (COREG) 3.125 MG tablet TAKE 1 TABLET(3.125 MG) BY MOUTH TWICE DAILY WITH A MEAL 09/15/20  Yes Hilty, Nadean Corwin, MD  Cholecalciferol (VITAMIN D-3) 125 MCG (5000 UT) TABS Take 5,000 Units by mouth every evening.   Yes [provider]  clopidogrel (PLAVIX) 75 MG tablet TAKE 1 TABLET(75 MG) BY MOUTH DAILY WITH BREAKFAST 08/28/20  Yes Hilty, Nadean Corwin, MD  co-enzyme Q-10 30 MG capsule Take 100 mg by mouth daily.   Yes [provider]  doxazosin (CARDURA) 2 MG tablet TAKE 1 TABLET(2 MG) BY MOUTH DAILY 10/26/20  Yes Lendon Colonel, NP  famotidine (PEPCID) 20 MG tablet Take 1 tablet (20 mg total) by mouth daily. 11/12/18  Yes Johnson, Clanford L, MD  ferrous sulfate 325 (65 FE) MG tablet Take 325 mg by mouth 2 (two) times daily with a meal.   Yes [provider]  furosemide (LASIX) 40 MG tablet Take 2 tablets (80 mg total) by mouth daily. Take 2 tablets (80mg ) once daily in the morning. Can take an additional 1 tablet (40mg ) in the afternoon as needed for weight gain (3lbs in 1 day or 5lbs in 1 week). Patient taking differently: Take 40 mg by mouth 2 (two) times daily. Take 2 tablets (80mg )  once daily in the morning. Can take an additional 1 tablet (40mg ) in the afternoon as needed for weight gain (3lbs in 1 day or 5lbs in 1 week). 06/19/20 12/09/20 Yes Barrett, Evelene Croon, PA-C  glipiZIDE (GLUCOTROL) 10 MG tablet Take 1 tablet (10 mg total) by mouth daily before breakfast. 10/15/20  Yes Barton Dubois, MD  isosorbide mononitrate (IMDUR) 30 MG 24 hr tablet Take 1 tablet (30 mg total) by mouth daily. 10/15/20  Yes Barton Dubois, MD  latanoprost (XALATAN) 0.005 % ophthalmic solution Place 1 drop into both eyes at bedtime. 09/30/19  Yes [provider]  levothyroxine (SYNTHROID) 75 MCG tablet Take 75 mcg by mouth daily before breakfast.  02/12/19  Yes [provider]  nitroGLYCERIN (NITROSTAT) 0.4 MG SL tablet Place 1 tablet (0.4 mg total) under the tongue every 5 (five) minutes x 3 doses as needed for chest pain. 06/19/20  Yes Barrett, Evelene Croon, PA-C  polyethylene glycol (MIRALAX) 17 g packet Take 17 g by mouth daily as needed for moderate constipation. 12/09/18  Yes Mercy Riding, MD  potassium chloride SA (KLOR-CON) 20 MEQ tablet Take 2 tablets (40 mEq total) by mouth daily. 06/20/20  Yes Barrett, Evelene Croon, PA-C  pyridoxine (B-6) 100 MG tablet Take 100 mg by mouth daily.   Yes [provider]  traMADol (ULTRAM) 50 MG tablet Take 1 tablet (50 mg total) by mouth every 6 (six) hours as needed. 02/15/19  Yes Lovorn, Megan, MD  vitamin E 400 UNIT capsule Take 400 Units by mouth daily.   Yes [provider]  zinc gluconate 50 MG tablet Take 50 mg by mouth daily.   Yes [provider]  betamethasone dipropionate 0.05 %  cream Apply 1 application topically 2 (two) times daily. Patient not taking: Reported on 12/09/2020 12/03/19   [provider]  [employer] ULTRA test strip 1 each 2 (two) times daily. 04/15/19   [provider]    Allergies    Iodinated diagnostic agents, Dye fdc red [red dye], and Hydralazine  Review of Systems   Review of Systems  Unable to perform ROS: Mental status change  Constitutional:  Positive for fever.   Physical Exam Updated Vital Signs BP 112/63   Pulse (!) 58   Temp (!) 100.6 F (38.1 C) (Oral)   Resp 19   Ht 5\' 11"  (1.803 m)   Wt 98 kg   SpO2 91%   BMI 30.13 kg/m   Physical Exam Vitals reviewed.  Constitutional:      Appearance: He is not diaphoretic.  HENT:     Head: Atraumatic.     Mouth/Throat:     Mouth: Mucous membranes are dry.  Eyes:     Pupils: Pupils are equal, round, and reactive to light.  Cardiovascular:     Rate and Rhythm: Regular rhythm.  Pulmonary:     Comments: Mildly harsh breath sounds.  No focal rales or rhonchi. Abdominal:     Tenderness: There is no abdominal tenderness.  Genitourinary:    Comments: Foley catheter in place with very cloudy urine. Musculoskeletal:        General: No tenderness.     Cervical back: Neck supple.     Comments: Bilateral below the knee amputations.  Neurological:     Comments: Sitting in bed.  Minimally responsive.  Nonverbal.  Gag reflex intact but not much else.    ED Results / Procedures / Treatments   Labs (all labs ordered are listed, but only  abnormal results are displayed) Labs Reviewed  COMPREHENSIVE METABOLIC PANEL - Abnormal; Notable for the following components:      Result Value   Sodium 130 (*)    CO2 19 (*)    Glucose, Bld 138 (*)    BUN 58 (*)    Creatinine, Ser 3.86 (*)    Calcium 8.8 (*)    Albumin 3.4 (*)    AST 76 (*)    Alkaline Phosphatase 35 (*)    GFR, Estimated 14 (*)    All other components within normal limits  CBC WITH DIFFERENTIAL/PLATELET -  Abnormal; Notable for the following components:   WBC 22.1 (*)    RBC 3.13 (*)    Hemoglobin 10.8 (*)    HCT 31.7 (*)    MCV 101.3 (*)    MCH 34.5 (*)    Neutro Abs 18.8 (*)    Monocytes Absolute 1.2 (*)    Abs Immature Granulocytes 0.11 (*)    All other components within normal limits  PROTIME-INR - Abnormal; Notable for the following components:   Prothrombin Time 18.9 (*)    INR 1.6 (*)    All other components within normal limits  CULTURE, BLOOD (ROUTINE X 2)  CULTURE, BLOOD (ROUTINE X 2)  RESP PANEL BY RT-PCR (FLU A&B, COVID) ARPGX2  URINE CULTURE  LACTIC ACID, PLASMA  URINALYSIS, ROUTINE W REFLEX MICROSCOPIC    EKG None  Radiology DG Chest 1 View  Result Date: 12/09/2020 CLINICAL DATA:  Fever, suspected sepsis. History of multiple UTIs with recent Foley catheter change. EXAM: CHEST  1 VIEW COMPARISON:  Chest radiograph 10/11/2020 FINDINGS: Persistent low lung volumes. Unchanged confluent opacity at the left lung base compared to 10/11/2020 with layering pleural fluid. New small patchy airspace opacity in the medial right lower lobe. No right pleural effusion. No pneumothorax. Stable cardiomegaly. No acute osseous abnormality. IMPRESSION: 1. New small patchy airspace opacity in the medial right lower lobe could represent infection versus aspiration related changes. 2. Unchanged confluent airspace opacity in the left lower lobe, favored to represent moderate effusion with left lower lobe atelectasis, though infection is not excluded. 3. Stable cardiomegaly. Electronically Signed   By: Ileana Roup M.D.   On: 12/09/2020 12:29    Procedures Procedures   Medications Ordered in ED Medications  lactated ringers bolus 1,000 mL (has no administration in time range)  sodium chloride 0.9 % bolus 1,000 mL (0 mLs Intravenous Stopped 12/09/20 1340)    ED Course  I have reviewed the triage vital signs and the nursing notes.  Pertinent labs & imaging results that were available  during my care of the patient were reviewed by me and considered in my medical decision making (see chart for details).    MDM Rules/Calculators/A&P                           Patient brought in with mental status change.  Found to be febrile.  I think likely source is urinary tract infection.  Had chronic Foley catheter that was recently changed.  Has had UTIs with Klebsiella susceptible only to meropenem in the past.  Urine very cloudy but has not returned yet.  Also new potential pneumonia on x-ray.  Mild hypoxia.  Will treat.  Also with decreased mental status aspiration considered.  Mouth is very dry.  Decreased mental status.  On recheck has improved slightly and now that he will flutter his eyes with some stimulation.  Will add antibiotic of meropenem to treat likely urine with very resistant organism.  Also should cover pneumonia.  Discussed with family members and would not want CPR.  Also would not necessarily want extra interventions such as central lines.  Will admit to hospitalist  CRITICAL CARE Performed by: Davonna Belling Total critical care time: 30 minutes Critical care time was exclusive of separately billable procedures and treating other patients. Critical care was necessary to treat or prevent imminent or life-threatening deterioration. Critical care was time spent personally by me on the following activities: development of treatment plan with patient and/or surrogate as well as nursing, discussions with consultants, evaluation of patient's response to treatment, examination of patient, obtaining history from patient or surrogate, ordering and performing treatments and interventions, ordering and review of laboratory studies, ordering and review of radiographic studies, pulse oximetry and re-evaluation of patient's condition.  Final Clinical Impression(s) / ED Diagnoses Final diagnoses:  Community acquired pneumonia, unspecified laterality  Encephalopathy  Urinary tract  infection associated with indwelling urethral catheter, initial encounter [facility])  Dehydration    Rx / DC Orders ED Discharge Orders     None        Davonna Belling, MD 12/09/20 1542

## 2020-12-09 NOTE — ED Triage Notes (Signed)
Pt clammy and decrease in alertness, noted a fever at home of 99.4.  recent foley catheter changed on Tuesday.  Pt with hx of multiple UTI's

## 2020-12-09 NOTE — Progress Notes (Signed)
Pharmacy Antibiotic Note  [name] is a 87 y.o. male admitted on 12/09/2020 with  hx of ESBL UTI .  Pharmacy has been consulted for meropenem dosing.  Plan: Meropenem 1000 mg IV x 1 dose Meropenem 500 mg IV every 12 hours.   Height: 5\' 11"  (180.3 cm) Weight: 98 kg (216 lb 0.8 oz) IBW/kg (Calculated) : 75.3  Temp (24hrs), Avg:98.9 F (37.2 C), Min:97.7 F (36.5 C), Max:100.6 F (38.1 C)  Recent Labs  Lab 12/09/20 1214 12/09/20 1243  WBC  --  22.1*  CREATININE  --  3.86*  LATICACIDVEN 1.6  --     Estimated Creatinine Clearance: 14.9 mL/min (A) (by C-G formula based on SCr of 3.86 mg/dL (H)).    Allergies  Allergen Reactions   Iodinated Diagnostic Agents Other (See Comments)    caused Fever   Dye Fdc Red [Red Dye] Hives    Tolerated red Docusate, Niferex   Hydralazine     Antimicrobials this admission: Merrem 11/26 >>  Microbiology results: 11/26 BCx: pending 11/26 UCx: pending  9/28 Ucx: ESBL klebsiella   Thank you for allowing pharmacy to be a part of this patient's care.  Ramond Craver 12/09/2020 7:31 PM

## 2020-12-10 DIAGNOSIS — R509 Fever, unspecified: Secondary | ICD-10-CM | POA: Diagnosis present

## 2020-12-10 DIAGNOSIS — J189 Pneumonia, unspecified organism: Secondary | ICD-10-CM | POA: Diagnosis present

## 2020-12-10 DIAGNOSIS — T83511A Infection and inflammatory reaction due to indwelling urethral catheter, initial encounter: Principal | ICD-10-CM

## 2020-12-10 DIAGNOSIS — I739 Peripheral vascular disease, unspecified: Secondary | ICD-10-CM

## 2020-12-10 DIAGNOSIS — I5032 Chronic diastolic (congestive) heart failure: Secondary | ICD-10-CM

## 2020-12-10 DIAGNOSIS — E860 Dehydration: Secondary | ICD-10-CM

## 2020-12-10 DIAGNOSIS — N390 Urinary tract infection, site not specified: Secondary | ICD-10-CM

## 2020-12-10 LAB — CBC
HCT: 29.3 % — ABNORMAL LOW (ref 39.0–52.0)
Hemoglobin: 9.6 g/dL — ABNORMAL LOW (ref 13.0–17.0)
MCH: 33.8 pg (ref 26.0–34.0)
MCHC: 32.8 g/dL (ref 30.0–36.0)
MCV: 103.2 fL — ABNORMAL HIGH (ref 80.0–100.0)
Platelets: 136 10*3/uL — ABNORMAL LOW (ref 150–400)
RBC: 2.84 MIL/uL — ABNORMAL LOW (ref 4.22–5.81)
RDW: 14.6 % (ref 11.5–15.5)
WBC: 16.6 10*3/uL — ABNORMAL HIGH (ref 4.0–10.5)
nRBC: 0 % (ref 0.0–0.2)

## 2020-12-10 LAB — BASIC METABOLIC PANEL
Anion gap: 11 (ref 5–15)
BUN: 58 mg/dL — ABNORMAL HIGH (ref 8–23)
CO2: 20 mmol/L — ABNORMAL LOW (ref 22–32)
Calcium: 8.5 mg/dL — ABNORMAL LOW (ref 8.9–10.3)
Chloride: 104 mmol/L (ref 98–111)
Creatinine, Ser: 4.02 mg/dL — ABNORMAL HIGH (ref 0.61–1.24)
GFR, Estimated: 13 mL/min — ABNORMAL LOW (ref >60)
Glucose, Bld: 86 mg/dL (ref 70–99)
Potassium: 3.7 mmol/L (ref 3.5–5.1)
Sodium: 135 mmol/L (ref 135–145)

## 2020-12-10 LAB — GLUCOSE, CAPILLARY
Glucose-Capillary: 85 mg/dL (ref 70–99)
Glucose-Capillary: 103 mg/dL — ABNORMAL HIGH (ref 70–99)
Glucose-Capillary: 118 mg/dL — ABNORMAL HIGH (ref 70–99)
Glucose-Capillary: 94 mg/dL (ref 70–99)
Glucose-Capillary: 82 mg/dL (ref 70–99)

## 2020-12-10 MED ORDER — ATORVASTATIN CALCIUM 20 MG PO TABS
20.0000 mg | ORAL_TABLET | Freq: Every evening | ORAL | Status: DC
  Administered 2020-12-10 – 2020-12-12 (×3): 20 mg via ORAL
  Filled 2020-12-10 (×3): qty 1

## 2020-12-10 MED ORDER — VITAMIN D 25 MCG (1000 UNIT) PO TABS
5000.0000 [IU] | ORAL_TABLET | Freq: Every evening | ORAL | Status: DC
  Administered 2020-12-10 – 2020-12-12 (×3): 5000 [IU] via ORAL
  Filled 2020-12-10 (×3): qty 5

## 2020-12-10 MED ORDER — FUROSEMIDE 40 MG PO TABS
40.0000 mg | ORAL_TABLET | Freq: Two times a day (BID) | ORAL | Status: DC
  Administered 2020-12-11 – 2020-12-13 (×5): 40 mg via ORAL
  Filled 2020-12-10 (×7): qty 1

## 2020-12-10 MED ORDER — APIXABAN 2.5 MG PO TABS
2.5000 mg | ORAL_TABLET | Freq: Two times a day (BID) | ORAL | Status: DC
  Administered 2020-12-11 – 2020-12-12 (×4): 2.5 mg via ORAL
  Filled 2020-12-10 (×6): qty 1

## 2020-12-10 MED ORDER — ZINC GLUCONATE 50 MG PO TABS: 50.0000 mg | ORAL_TABLET | Freq: Every day | ORAL | Status: DC

## 2020-12-10 MED ORDER — ISOSORBIDE MONONITRATE ER 30 MG PO TB24
15.0000 mg | ORAL_TABLET | Freq: Every day | ORAL | Status: DC
  Administered 2020-12-11 – 2020-12-12 (×2): 15 mg via ORAL
  Filled 2020-12-10 (×3): qty 1

## 2020-12-10 MED ORDER — INSULIN ASPART 100 UNIT/ML IJ SOLN: 0.0000 [IU] | Freq: Three times a day (TID) | INTRAMUSCULAR | Status: DC

## 2020-12-10 MED ORDER — LEVOTHYROXINE SODIUM 75 MCG PO TABS
75.0000 ug | ORAL_TABLET | Freq: Every day | ORAL | Status: DC
  Administered 2020-12-12 – 2020-12-13 (×2): 75 ug via ORAL
  Filled 2020-12-10 (×3): qty 1

## 2020-12-10 MED ORDER — CHLORHEXIDINE GLUCONATE CLOTH 2 % EX PADS
6.0000 | MEDICATED_PAD | Freq: Every day | CUTANEOUS | Status: DC
  Administered 2020-12-10 – 2020-12-14 (×5): 6 via TOPICAL

## 2020-12-10 MED ORDER — POLYETHYLENE GLYCOL 3350 17 G PO PACK: 17.0000 g | PACK | Freq: Every day | ORAL | Status: DC | PRN

## 2020-12-10 MED ORDER — FERROUS SULFATE 325 (65 FE) MG PO TABS
325.0000 mg | ORAL_TABLET | Freq: Every day | ORAL | Status: DC
  Administered 2020-12-11 – 2020-12-12 (×2): 325 mg via ORAL
  Filled 2020-12-10 (×3): qty 1

## 2020-12-10 MED ORDER — LATANOPROST 0.005 % OP SOLN
1.0000 [drp] | Freq: Every day | OPHTHALMIC | Status: DC
Start: 2020-12-10 — End: 2020-12-14
  Administered 2020-12-10 – 2020-12-13 (×4): 1 [drp] via OPHTHALMIC
  Filled 2020-12-10: qty 2.5

## 2020-12-10 MED ORDER — CLOPIDOGREL BISULFATE 75 MG PO TABS
75.0000 mg | ORAL_TABLET | Freq: Every day | ORAL | Status: DC
  Administered 2020-12-10 – 2020-12-12 (×3): 75 mg via ORAL
  Filled 2020-12-10 (×4): qty 1

## 2020-12-10 MED ORDER — AMLODIPINE BESYLATE 5 MG PO TABS
5.0000 mg | ORAL_TABLET | Freq: Every day | ORAL | Status: DC
  Administered 2020-12-11 – 2020-12-12 (×2): 5 mg via ORAL
  Filled 2020-12-10 (×3): qty 1

## 2020-12-10 NOTE — Progress Notes (Signed)
   12/10/20 1405  Assess: MEWS Score  Temp 97.9 F (36.6 C)  BP 116/66  Pulse Rate 66  Resp (!) 28  SpO2 (!) 88 %  O2 Device Room Air  Assess: MEWS Score  MEWS Temp 0  MEWS Systolic 0  MEWS Pulse 0  MEWS RR 2  MEWS LOC 0  MEWS Score 2  MEWS Score Color Yellow  Assess: if the MEWS score is Yellow or Red  Were vital signs taken at a resting state? Yes  Focused Assessment Change from prior assessment (see assessment flowsheet)  Early Detection of Sepsis Score *See Row Information* Medium  MEWS guidelines implemented *See Row Information* Yes  Treat  Pain Scale Faces  Pain Score 0  Breathing 2  Negative Vocalization 0  Facial Expression 0  Body Language 0  Consolability 0  PAINAD Score 2  Take Vital Signs  Increase Vital Sign Frequency  Yellow: Q 2hr X 2 then Q 4hr X 2, if remains yellow, continue Q 4hrs  Escalate  MEWS: Escalate Yellow: discuss with charge nurse/RN and consider discussing with provider and RRT  Notify: Charge Nurse/RN  Name of Charge Nurse/RN Notified Zenaida Deed RN  Date Charge Nurse/RN Notified 12/10/20  Time Charge Nurse/RN Notified 8527  Notify: Provider  Provider Name/Title Dr Wynetta Emery  Date Provider Notified 12/10/20  Time Provider Notified 1418  Notification Type Page (secure chat)  Notification Reason Other (Comment) (O2 88% RA)  Date of Provider Response 12/10/20  Time of Provider Response 1418

## 2020-12-10 NOTE — Plan of Care (Signed)
  Problem: Clinical Measurements: Goal: Respiratory complications will improve Outcome: Not Met (add Reason)   Problem: Nutrition: Goal: Adequate nutrition will be maintained Outcome: Not Met (add Reason)  Patient's respirations have increased and patient is not tolerating PO intake this shift.

## 2020-12-10 NOTE — Progress Notes (Signed)
PROGRESS NOTE   [name]  QQP:[id number] DOB: Nov 02, 1927 DOA: 12/09/2020 PCP: Redmond School, MD   Chief Complaint  Patient presents with   Fever   Level of care: Med-Surg  Brief Admission History:   87 y.o. male with DM type II, HLD, PVD, s/p bilateral lower extremity BKA, chronic diastolic CHF, moderate to severe aortic stenosis, s/p NSTEMI, HTN, hypothyroidism, OSA (not on CPAP), urinary retention secondary to BPH with chronic indwelling Foley catheter.  CKD stage IV, patient with recent hospitalization due to altered mental status from ESBL UTI, he was recently discharged on 10/15/2020. -Patient was brought by his son due to decreased mental status, decreased responsiveness, symptoms has been ongoing for last 48 hours, decreased oral Intake and appetite, Foley catheter was exchanged before few days, and with multiple UTIs in the past requiring IV antibiotics, and himself cannot provide any complaints.  -In ED patient was noted altered, not communicative which is far from his baseline per son, work-up significant for sodium of 133, creatinine 3.8 around baseline, white blood cell count of 22,000, lactic acid within normal limit at 1.6, UA is positive, chest x-ray significant for right lower lobe infiltrate, [facility]ist consulted to admit  Assessment & Plan:   Principal Problem:   Urinary tract infection associated with indwelling urethral catheter ([place]) Active Problems:   RLL pneumonia   Peripheral vascular disease ([place])   Hyperlipidemia   Hypothyroidism   Chronic diastolic CHF (congestive heart failure) (HCC)   PAF (paroxysmal atrial fibrillation) (HCC)   Acquired absence of right leg below knee (HCC)   Chronic kidney disease (CKD), stage IV (severe) (HCC)   Pressure injury of skin   Acute metabolic encephalopathy   Acute UTI   Dehydration  Acute metabolic encephalopathy  - secondary to MDR UTI and pneumonia  - continue IV antibiotics and supportive measures    RLL pneumonia  - suspect aspiration event - continue IV antibiotics - consult to SLP to check swallow function  - dysphagia diet ordered  Dysphagia  - appreciate SLP evaluation  - Dys 3 diet ordered   MDR UTI  - recently tested positive for ESBL E coli (likely colonized with it) - urinalysis is very abnormal and with acute symptoms will treat - continue meropenem as ordered, continue for at least 3 days  Type 2 diabetes mellitus with vascular complications  - Sensitive SSI coverage given renal failure  Stage V CKD  -eGFR is now less than 15  - renally dose medications as able - resume home lasix tablets daily   PAF  - resume home apixaban  - holding carvedilol due to HR<65   Dehydration  - temporarily held diuretics - gently hydrated with IV fluids - now reduce IV fluid rate and restart lasix  Hypothyroidism  - resume home medication    DVT prophylaxis: apixaban  Code Status: DNR  Family Communication: sons 11/27  Disposition: TBD  Status is: Inpatient  Remains inpatient appropriate because: IV antibiotics required   Consultants:  SLP  Procedures:  N/a   Antimicrobials:  Meropenem 11/26>> Azithromycin 11/26>>   Subjective: Altered mentation at this time, no specific complaints  Objective: Vitals:   12/09/20 2349 12/10/20 0415 12/10/20 1405 12/10/20 1426  BP: (!) 109/58 140/77 116/66   Pulse: (!) 56 66 66   Resp: 16 17 (!) 28 (!) 24  Temp: 98.4 F (36.9 C) 98.2 F (36.8 C) 97.9 F (36.6 C)   TempSrc: Oral  Oral   SpO2: 100% 93% [name])  88% 94%  Weight:      Height:        Intake/Output Summary (Last 24 hours) at 12/10/2020 1538 Last data filed at 12/10/2020 1530 Gross per 24 hour  Intake 1333.81 ml  Output 450 ml  Net 883.81 ml   Filed Weights   12/09/20 1146  Weight: 98 kg    Examination:  General exam: elderly male with dementia, Appears acutely ill and chronically ill.   Respiratory system: Clear to auscultation. Respiratory  effort normal. Cardiovascular system: normal S1 & S2 heard. No JVD, murmurs, rubs, gallops or clicks. No pedal edema. Gastrointestinal system: Abdomen is nondistended, soft and nontender. No organomegaly or masses felt. Normal bowel sounds heard. Central nervous system: Alert and oriented. No focal neurological deficits. Extremities: Symmetric 5 x 5 power. Skin: No rashes, lesions or ulcers Psychiatry: Judgement and insight appear unable to determine. Mood & affect flat.   Data Reviewed: I have personally reviewed following labs and imaging studies  CBC: Recent Labs  Lab 12/09/20 1243 12/10/20 0242  WBC 22.1* 16.6*  NEUTROABS 18.8*  --   HGB 10.8* 9.6*  HCT 31.7* 29.3*  MCV 101.3* 103.2*  PLT 161 136*    Basic Metabolic Panel: Recent Labs  Lab 12/09/20 1243 12/10/20 0242  NA 130* 135  K 3.8 3.7  CL 99 104  CO2 19* 20*  GLUCOSE 138* 86  BUN 58* 58*  CREATININE 3.86* 4.02*  CALCIUM 8.8* 8.5*    GFR: Estimated Creatinine Clearance: 14.3 mL/min (A) (by C-G formula based on SCr of 4.02 mg/dL (H)).  Liver Function Tests: Recent Labs  Lab 12/09/20 1243  AST 76*  ALT 20  ALKPHOS 35*  BILITOT 0.7  PROT 7.2  ALBUMIN 3.4*    CBG: Recent Labs  Lab 12/09/20 2004 12/09/20 2354 12/10/20 0419 12/10/20 0711 12/10/20 1125  GLUCAP 103* 88 85 103* 118*    Recent Results (from the past 240 hour(s))  Culture, blood (Routine x 2)     Status: None (Preliminary result)   Collection Time: 12/09/20 12:00 PM   Specimen: BLOOD RIGHT FOREARM  Result Value Ref Range Status   Specimen Description   Final    BLOOD RIGHT FOREARM BOTTLES DRAWN AEROBIC AND ANAEROBIC   Special Requests Blood Culture adequate volume  Final   Culture   Final    NO GROWTH 1 DAY Performed at [facility] , [address], [place], [place] [address]    Report Status PENDING  Incomplete  Culture, blood (Routine x 2)     Status: None (Preliminary result)   Collection Time: 12/09/20 12:43 PM    Specimen: BLOOD LEFT FOREARM  Result Value Ref Range Status   Specimen Description   Final    BLOOD LEFT FOREARM BOTTLES DRAWN AEROBIC AND ANAEROBIC   Special Requests Blood Culture adequate volume  Final   Culture   Final    NO GROWTH < 24 HOURS Performed at [facility], [address], [place], [place] [address]    Report Status PENDING  Incomplete  Resp Panel by RT-PCR (Flu A&B, Covid) Nasopharyngeal Swab     Status: None   Collection Time: 12/09/20  2:16 PM   Specimen: Nasopharyngeal Swab; Nasopharyngeal(NP) swabs in vial transport medium  Result Value Ref Range Status   SARS Coronavirus 2 by RT PCR NEGATIVE NEGATIVE Final    Comment: (NOTE) SARS-CoV-2 target nucleic acids are NOT DETECTED.  The SARS-CoV-2 RNA is generally detectable in upper respiratory specimens during the acute phase  of infection. The lowest concentration of SARS-CoV-2 viral copies this assay can detect is 138 copies/mL. A negative result does not preclude SARS-Cov-2 infection and should not be used as the sole basis for treatment or other patient management decisions. A negative result may occur with  improper specimen collection/handling, submission of specimen other than nasopharyngeal swab, presence of viral mutation(s) within the areas targeted by this assay, and inadequate number of viral copies(<138 copies/mL). A negative result must be combined with clinical observations, patient history, and epidemiological information. The expected result is Negative.  Fact Sheet for Patients:  EntrepreneurPulse.com.au  Fact Sheet for Healthcare Providers:  IncredibleEmployment.be  This test is no t yet approved or cleared by the [place] FDA and  has been authorized for detection and/or diagnosis of SARS-CoV-2 by FDA under an Emergency Use Authorization (EUA). This EUA will remain  in effect (meaning this test can be used) for the duration of the COVID-19 declaration  under Section 564(b)(1) of the Act, 21 U.S.C.section 360bbb-3(b)(1), unless the authorization is terminated  or revoked sooner.       Influenza A by PCR NEGATIVE NEGATIVE Final   Influenza B by PCR NEGATIVE NEGATIVE Final    Comment: (NOTE) The Xpert Xpress SARS-CoV-2/FLU/RSV plus assay is intended as an aid in the diagnosis of influenza from Nasopharyngeal swab specimens and should not be used as a sole basis for treatment. Nasal washings and aspirates are unacceptable for Xpert Xpress SARS-CoV-2/FLU/RSV testing.  Fact Sheet for Patients: EntrepreneurPulse.com.au  Fact Sheet for Healthcare Providers: IncredibleEmployment.be  This test is not yet approved or cleared by the [place] FDA and has been authorized for detection and/or diagnosis of SARS-CoV-2 by FDA under an Emergency Use Authorization (EUA). This EUA will remain in effect (meaning this test can be used) for the duration of the COVID-19 declaration under Section 564(b)(1) of the Act, 21 U.S.C. section 360bbb-3(b)(1), unless the authorization is terminated or revoked.  Performed at [facility], [address], [place], [place] [address]      Radiology Studies: DG Chest 1 View  Result Date: 12/09/2020 CLINICAL DATA:  Fever, suspected sepsis. History of multiple UTIs with recent Foley catheter change. EXAM: CHEST  1 VIEW COMPARISON:  Chest radiograph 10/11/2020 FINDINGS: Persistent low lung volumes. Unchanged confluent opacity at the left lung base compared to 10/11/2020 with layering pleural fluid. New small patchy airspace opacity in the medial right lower lobe. No right pleural effusion. No pneumothorax. Stable cardiomegaly. No acute osseous abnormality. IMPRESSION: 1. New small patchy airspace opacity in the medial right lower lobe could represent infection versus aspiration related changes. 2. Unchanged confluent airspace opacity in the left lower lobe, favored to represent  moderate effusion with left lower lobe atelectasis, though infection is not excluded. 3. Stable cardiomegaly. Electronically Signed   By: Ileana Roup M.D.   On: 12/09/2020 12:29    Scheduled Meds:  [START ON 12/11/2020] amLODipine  5 mg Oral Daily   apixaban  2.5 mg Oral BID   atorvastatin  20 mg Oral QPM   clopidogrel  75 mg Oral Daily   [START ON 12/11/2020] ferrous sulfate  325 mg Oral Q breakfast   [START ON 12/11/2020] furosemide  40 mg Oral BID   insulin aspart  0-9 Units Subcutaneous TID WC   [START ON 12/11/2020] isosorbide mononitrate  15 mg Oral Daily   latanoprost  1 drop Both Eyes QHS   [START ON 12/11/2020] levothyroxine  75 mcg Oral QAC breakfast   Vitamin D-3  5,000 Units Oral QPM   zinc gluconate  50 mg Oral Daily   Continuous Infusions:  sodium chloride 30 mL/hr at 12/10/20 1530   azithromycin 500 mg (12/09/20 1628)   meropenem (MERREM) IV 500 mg (12/10/20 1016)     LOS: 1 day   Time spent: 76 mins   Seeley [place] Wynetta Emery, MD How to contact the [facility] Attending or Consulting provider [place] or covering provider during after hours [place], for this patient?  Check the care team in [facility] and look for a) attending/consulting TR[facility] provider listed and b) the [facility] team listed Log into [email or web address] and use [place]'s universal password to access. If you do not have the password, please contact the hospital operator. Locate the [facility] provider you are looking for under [facility] and page to a number that you can be directly reached. If you still have difficulty reaching the provider, please page the [facility] (Director on Call) for the Hospitalists listed on amion for assistance.  12/10/2020, 3:38 PM

## 2020-12-10 NOTE — Progress Notes (Addendum)
HOSPITAL MEDICINE OVERNIGHT EVENT NOTE    Notified by nursing that patient is exhibiting an elevated mews score of 3.   Chart reviewed, patient is currently being treated for sepsis thought to be secondary to a combination of catheter associated urinary tract infection with superimposed right lower lobe aspiration pneumonia.  Patient is currently receiving intravenous meropenem and is currently on continuous isotonic saline infusion.  Nursing reports the patient has been confused and lethargic but does respond to verbal stimuli.  This is no change from his mentation earlier today.  Patient does not seem to exhibit any new complaints or symptoms.    Patient continues to exhibit intermittent tachypnea but otherwise is hemodynamically stable.  Continuing to monitor patient closely, continue current plan of care including broad-spectrum antibiotics and fluids as ordered.  Nursing performing frequent bedside assessments as well as vital signs and will notify me if there are any further clinical changes.  Vernelle Emerald  MD [facility]

## 2020-12-10 NOTE — Progress Notes (Signed)
LPN called Respiratory. Update given on patient's change and O2 need. Will continue to monitor. Pt appears to be resting comfortably with no s/s of distress.

## 2020-12-10 NOTE — Progress Notes (Signed)
Patient dropped to 88% on RA while tech was obtaining VS. Nurse assessed. Respirations fast @ 28 per minute with abdominal/belly breathing present. Patient turned yellow mews. MD made aware. Charge RN informed as well. Mews documentation completed. Patient O2 stat maintained @ 94% on 2L O2. Son [name] at bedside. Will continue to monitor patient. Call light within reach.

## 2020-12-10 NOTE — Evaluation (Signed)
Clinical/Bedside Swallow Evaluation Patient Details  Name: [name] MRN: [id number] Date of Birth: [birth year]  Today's Date: 12/10/2020 Time: SLP Start Time (ACUTE ONLY): 76 SLP Stop Time (ACUTE ONLY): 1056 SLP Time Calculation (min) (ACUTE ONLY): 31 min  Past Medical History:  Past Medical History:  Diagnosis Date   Arthritis    Borderline diabetic    CHF (congestive heart failure) ([place])    Chronic kidney disease    Diabetes mellitus without complication ([place])    Dysrhythmia    Glaucoma    Hyperlipidemia    Hypertension    Hypothyroidism    Myocardial infarction ([place])    PVD (peripheral vascular disease) ([place])    Sleep apnea    Cpap ordered but doesnt use   Urinary retention due to benign prostatic hyperplasia 12/28/2018   Past Surgical History:  Past Surgical History:  Procedure Laterality Date   ABDOMINAL AORTOGRAM W/LOWER EXTREMITY N/A 07/27/2018   Procedure: ABDOMINAL AORTOGRAM W/LOWER EXTREMITY;  Surgeon: Lorretta Harp, MD;  Location: [place] [facility];  Service: Cardiovascular;  Laterality: N/A;   AMPUTATION Right 08/28/2018   Procedure: RIGHT BELOW KNEE AMPUTATION;  Surgeon: [name], MD;  Location: [place];  Service: Orthopedics;  Laterality: Right;   AMPUTATION Left 12/02/2018   Procedure: LEFT TRANSMETATARSAL AMPUTATION AND NEGATIVE PRESSURE WOUND VAC PLACEMENT;  Surgeon: [name], MD;  Location: [place];  Service: Orthopedics;  Laterality: Left;   AMPUTATION Left 12/18/2018   Procedure: LEFT BELOW KNEE AMPUTATION;  Surgeon: [name], MD;  Location: [place];  Service: Orthopedics;  Laterality: Left;   APPENDECTOMY  1943   BELOW KNEE LEG AMPUTATION Left 12/18/2018   CATARACT EXTRACTION  2012   x2   COLONOSCOPY N/A 11/01/2014   Procedure: COLONOSCOPY;  Surgeon: Aviva Signs, MD;  [facility];  Service: Gastroenterology;  Laterality: N/A;   ESOPHAGOGASTRODUODENOSCOPY N/A 11/01/2014   Procedure: ESOPHAGOGASTRODUODENOSCOPY (EGD);   Surgeon: Aviva Signs, MD;  [facility];  Service: Gastroenterology;  Laterality: N/A;   HERNIA REPAIR Left    INGUINAL HERNIA REPAIR Right 03/01/2013   Procedure: RIGHT INGUINAL HERNIORRHAPHY;  Surgeon: Jamesetta So, MD;  Location[facility];  Service: General;  Laterality: Right;   INSERTION OF MESH Right 03/01/2013   Procedure: INSERTION OF MESH;  Surgeon: Jamesetta So, MD;  [facility];  Service: General;  Laterality: Right;   LOWER EXTREMITY ANGIOGRAPHY Right 08/25/2018   Procedure: LOWER EXTREMITY ANGIOGRAPHY;  Surgeon: [place] Hampshire, MD;  Location: [place] [facility];  Service: Cardiovascular;  Laterality: Right;   LOWER EXTREMITY ANGIOGRAPHY N/A 11/25/2018   Procedure: LOWER EXTREMITY ANGIOGRAPHY;  Surgeon: [place] Hampshire, MD;  Location: [place] [facility];  Service: Cardiovascular;  Laterality: N/A;   NM MYOCAR PERF WALL MOTION  06/05/2010   no significant ischemia   PERIPHERAL VASCULAR ATHERECTOMY Right 08/03/2018   Procedure: PERIPHERAL VASCULAR ATHERECTOMY;  Surgeon: Lorretta Harp, MD;  Location: [place] [facility];  Service: Cardiovascular;  Laterality: Right;   PERIPHERAL VASCULAR ATHERECTOMY Left 11/23/2018   Procedure: PERIPHERAL VASCULAR ATHERECTOMY;  Surgeon: Lorretta Harp, MD;  Location: [place] [facility];  Service: Cardiovascular;  Laterality: Left;  sfa with dc balloon   PERIPHERAL VASCULAR BALLOON ANGIOPLASTY Left 11/25/2018   Procedure: PERIPHERAL VASCULAR BALLOON ANGIOPLASTY;  Surgeon: [place] Hampshire, MD;  Location: [place] [facility];  Service: Cardiovascular;  Laterality: Left;  popliteal   PERIPHERAL VASCULAR INTERVENTION Left 11/25/2018   Procedure:  PERIPHERAL VASCULAR INTERVENTION;  Surgeon: [place] Hampshire, MD;  Location: [place] [facility];  Service: Cardiovascular;  Laterality: Left;  tibial peroneal trunk and peroneal stents    stent  06/14/2010   left leg   STUMP REVISION Left 01/20/2019   Procedure: REVISION LEFT BELOW KNEE  AMPUTATION;  Surgeon: [name], MD;  Location: [place];  Service: Orthopedics;  Laterality: Left;   US ECHOCARDIOGRAPHY  01/21/2006   moderate mitral annular ca+, mild MR, AOV moderately sclerotic.   HPI:  [name]  is a 87 y.o. male,  WM PMHx DM type II uncontrolled with complication, HLD, s/p bilateral lower extremity BKA, chronic diastolic CHF, moderate to severe aortic stenosis, s/p NSTEMI, HTN, hypothyroidism, OSA (not on CPAP), urinary retention secondary to BPH indwelling Foley catheter.  CKD stage IV, patient with recent hospitalization due to altered mental status from ESBL UTI, he is discharged on 10/15/2020. Patient was brought by his son due to decreased mental status, decreased responsiveness, symptoms has been ongoing for last 48 hours, decreased oral Intake and appetite, Foley catheter was exchanged before few days, and with multiple UTIs in the past requiring IV antibiotics, and himself cannot provide any complaints. UA positive and chest x-ray significant for right lower lobe infiltrate. BSE requested.    Assessment / Plan / Recommendation  Clinical Impression  Clinical swallow evaluation completed at bedside with family present. Pt lives at home, but has assist from family. He typically consumes meals upright in his wheelchair and can feed himself. Oral motor function is essentially WNL, with mild weakness and slowness, likely due to lethargy. He initially had difficulty keeping his upper dentures in place, however improved with increased swallows. Pt consumed ice chips, thin via cup/straw, puree, and regular textures without overt signs of reduced airway protection. Pt with labial spillage with cup sips thin and improved oral control with straw sips. Pt with slow, labored mastication of regular textures resulting in prolonged oral transit. Pt repositioned several times, as he leans to the right. It should be noted that Pt has RLL PNA, however family reports he has not had PNA for  over a year. Recommend D3/mech soft and thin liquids, straw ok, po medications whole in puree for now, feeder assist and supervision with meals and po only when alert and upright. SLP will follow during acute stay. SLP Visit Diagnosis: Dysphagia, unspecified (R13.10)    Aspiration Risk  Mild aspiration risk    Diet Recommendation Dysphagia 3 (Mech soft);Thin liquid   Liquid Administration via: Straw Medication Administration: Whole meds with puree Supervision: Staff to assist with self feeding;Full supervision/cueing for compensatory strategies Compensations: Slow rate;Small sips/bites Postural Changes: Seated upright at 90 degrees;Remain upright for at least 30 minutes after po intake    Other  Recommendations Oral Care Recommendations: Oral care BID;Staff/trained caregiver to provide oral care Other Recommendations: Clarify dietary restrictions    Recommendations for follow up therapy are one component of a multi-disciplinary discharge planning process, led by the attending physician.  Recommendations may be updated based on patient status, additional functional criteria and insurance authorization.  Follow up Recommendations No SLP follow up      Assistance Recommended at Discharge Frequent or constant Supervision/Assistance  Functional Status Assessment Patient has had a recent decline in their functional status and demonstrates the ability to make significant improvements in function in a reasonable and predictable amount of time.  Frequency and Duration min 2x/week  1 week       Prognosis  Prognosis for Safe Diet Advancement: Good      Swallow Study   General Date of Onset: 12/09/20 HPI: [name]  is a 87 y.o. male,  WM PMHx DM type II uncontrolled with complication, HLD, s/p bilateral lower extremity BKA, chronic diastolic CHF, moderate to severe aortic stenosis, s/p NSTEMI, HTN, hypothyroidism, OSA (not on CPAP), urinary retention secondary to BPH indwelling Foley  catheter.  CKD stage IV, patient with recent hospitalization due to altered mental status from ESBL UTI, he is discharged on 10/15/2020. Patient was brought by his son due to decreased mental status, decreased responsiveness, symptoms has been ongoing for last 48 hours, decreased oral Intake and appetite, Foley catheter was exchanged before few days, and with multiple UTIs in the past requiring IV antibiotics, and himself cannot provide any complaints. UA positive and chest x-ray significant for right lower lobe infiltrate. BSE requested. Type of Study: Bedside Swallow Evaluation Previous Swallow Assessment: 11/13/2018 BSE reg/thin Diet Prior to this Study: NPO Temperature Spikes Noted: No Respiratory Status: Room air History of Recent Intubation: No Behavior/Cognition: Alert;Cooperative;Pleasant mood Oral Cavity Assessment: Dried secretions Oral Care Completed by SLP: Yes Oral Cavity - Dentition: Dentures, top;Dentures, bottom Vision: Impaired for self-feeding Self-Feeding Abilities: Needs assist (typically able to feed self, needs assist currently) Patient Positioning: Upright in bed Baseline Vocal Quality: Normal Volitional Cough: Strong Volitional Swallow: Able to elicit    Oral/Motor/Sensory Function Overall Oral Motor/Sensory Function: Within functional limits (mild slowness to movements due to lethargy)   Ice Chips Ice chips: Within functional limits Presentation: Spoon   Thin Liquid Thin Liquid: Within functional limits Presentation: Cup;Straw Other Comments: labial spillage with cup sip, ok with straw, uses a straw at home    Nectar Thick Nectar Thick Liquid: Not tested   Honey Thick Honey Thick Liquid: Not tested   Puree Puree: Within functional limits Presentation: Spoon   Solid     Solid: Impaired Presentation: Self Fed Oral Phase Impairments: Impaired mastication Oral Phase Functional Implications: Prolonged oral transit     Thank you,  Genene Churn,  [place]  Jarmon Javid 12/10/2020,11:04 AM

## 2020-12-11 ENCOUNTER — Inpatient Hospital Stay (HOSPITAL_COMMUNITY): Payer: Medicare Other

## 2020-12-11 DIAGNOSIS — E039 Hypothyroidism, unspecified: Secondary | ICD-10-CM

## 2020-12-11 DIAGNOSIS — T83511D Infection and inflammatory reaction due to indwelling urethral catheter, subsequent encounter: Secondary | ICD-10-CM

## 2020-12-11 DIAGNOSIS — N184 Chronic kidney disease, stage 4 (severe): Secondary | ICD-10-CM

## 2020-12-11 LAB — RENAL FUNCTION PANEL
Albumin: 3 g/dL — ABNORMAL LOW (ref 3.5–5.0)
Anion gap: 11 (ref 5–15)
BUN: 65 mg/dL — ABNORMAL HIGH (ref 8–23)
CO2: 20 mmol/L — ABNORMAL LOW (ref 22–32)
Calcium: 8.6 mg/dL — ABNORMAL LOW (ref 8.9–10.3)
Chloride: 105 mmol/L (ref 98–111)
Creatinine, Ser: 4.17 mg/dL — ABNORMAL HIGH (ref 0.61–1.24)
GFR, Estimated: 13 mL/min — ABNORMAL LOW (ref >60)
Glucose, Bld: 64 mg/dL — ABNORMAL LOW (ref 70–99)
Phosphorus: 4.7 mg/dL — ABNORMAL HIGH (ref 2.5–4.6)
Potassium: 4.2 mmol/L (ref 3.5–5.1)
Sodium: 136 mmol/L (ref 135–145)

## 2020-12-11 LAB — CBC
HCT: 29.7 % — ABNORMAL LOW (ref 39.0–52.0)
Hemoglobin: 9.7 g/dL — ABNORMAL LOW (ref 13.0–17.0)
MCH: 34.2 pg — ABNORMAL HIGH (ref 26.0–34.0)
MCHC: 32.7 g/dL (ref 30.0–36.0)
MCV: 104.6 fL — ABNORMAL HIGH (ref 80.0–100.0)
Platelets: 151 10*3/uL (ref 150–400)
RBC: 2.84 MIL/uL — ABNORMAL LOW (ref 4.22–5.81)
RDW: 14.6 % (ref 11.5–15.5)
WBC: 14.6 10*3/uL — ABNORMAL HIGH (ref 4.0–10.5)
nRBC: 0 % (ref 0.0–0.2)

## 2020-12-11 LAB — MAGNESIUM: Magnesium: 2.2 mg/dL (ref 1.7–2.4)

## 2020-12-11 LAB — GLUCOSE, CAPILLARY
Glucose-Capillary: 64 mg/dL — ABNORMAL LOW (ref 70–99)
Glucose-Capillary: 87 mg/dL (ref 70–99)
Glucose-Capillary: 136 mg/dL — ABNORMAL HIGH (ref 70–99)
Glucose-Capillary: 162 mg/dL — ABNORMAL HIGH (ref 70–99)

## 2020-12-11 MED ORDER — IPRATROPIUM-ALBUTEROL 0.5-2.5 (3) MG/3ML IN SOLN
3.0000 mL | Freq: Three times a day (TID) | RESPIRATORY_TRACT | Status: DC
  Administered 2020-12-11 – 2020-12-12 (×6): 3 mL via RESPIRATORY_TRACT
  Filled 2020-12-11 (×6): qty 3

## 2020-12-11 NOTE — Evaluation (Signed)
Physical Therapy Evaluation Patient Details Name: [name] MRN: [id number] DOB: May 09, 1927 Today's Date: 12/11/2020  History of Present Illness  [name]  is a 87 y.o. male,  WM PMHx DM type II uncontrolled with complication, HLD, s/p bilateral lower extremity BKA, chronic diastolic CHF, moderate to severe aortic stenosis, s/p NSTEMI, HTN, hypothyroidism, OSA (not on CPAP), urinary retention secondary to BPH indwelling Foley catheter.  CKD stage IV, patient with recent hospitalization due to altered mental status from ESBL UTI, he is discharged on 10/15/2020.   Clinical Impression  Patient functioning near baseline as he is assisted by family with ADL. Patient requiring max assist for bed mobility secondary to weakness. Spoke with patient's family who stated they assist with ADL and have all necessary equipment at home. They were getting HHPT and would like to continue upon discharge. Patient does not require additional acute PT at this time.        Recommendations for follow up therapy are one component of a multi-disciplinary discharge planning process, led by the attending physician.  Recommendations may be updated based on patient status, additional functional criteria and insurance authorization.  Follow Up Recommendations Home health PT    Assistance Recommended at Discharge Frequent or constant Supervision/Assistance  Functional Status Assessment Patient has had a recent decline in their functional status and demonstrates the ability to make significant improvements in function in a reasonable and predictable amount of time.  Equipment Recommendations  None recommended by PT    Recommendations for Other Services       Precautions / Restrictions Precautions Precautions: Fall Precaution Comments: bilateral BKA Restrictions Weight Bearing Restrictions: No      Mobility  Bed Mobility Overal bed mobility: Needs Assistance Bed Mobility: Supine to Sit     Supine to  sit: Max assist;HOB elevated     General bed mobility comments: Patient requires assist with all mobility at baseline    Transfers                        Ambulation/Gait                  Stairs            Wheelchair Mobility    Modified Rankin (Stroke Patients Only)       Balance                                             Pertinent Vitals/Pain Pain Assessment: No/denies pain    Home Living Family/patient expects to be discharged to:: Private residence Living Arrangements: Children Available Help at Discharge: Family;Available 24 hours/day Type of Home: House Home Access: Ramped entrance       Home Layout: One level Home Equipment: Conservation officer, nature (2 wheels);BSC/3in1;Shower seat;Wheelchair - manual;Hospital bed Additional Comments: has steady mechanical lift    Prior Function Prior Level of Function : Needs assist             Mobility Comments: family uses lift for transfers ADLs Comments: family assists with ADL     Hand Dominance        Extremity/Trunk Assessment   Upper Extremity Assessment Upper Extremity Assessment: Defer to OT evaluation    Lower Extremity Assessment Lower Extremity Assessment: Generalized weakness (bilateral BKA)       Communication   Communication: HOH  Cognition Arousal/Alertness:  Awake/alert Behavior During Therapy: WFL for tasks assessed/performed;Flat affect Overall Cognitive Status: Impaired/Different from baseline                                          General Comments      Exercises     Assessment/Plan    PT Assessment All further PT needs can be met in the next venue of care  PT Problem List Decreased strength;Decreased mobility;Decreased activity tolerance;Decreased balance       PT Treatment Interventions      PT Goals (Current goals can be found in the Care Plan section)  Acute Rehab PT Goals Patient Stated Goal: return home PT  Goal Formulation: With patient Time For Goal Achievement: 12/11/20 Potential to Achieve Goals: Good    Frequency     Barriers to discharge        Co-evaluation               AM-PAC PT "6 Clicks" Mobility  Outcome Measure Help needed turning from your back to your side while in a flat bed without using bedrails?: A Lot Help needed moving from lying on your back to sitting on the side of a flat bed without using bedrails?: A Lot Help needed moving to and from a bed to a chair (including a wheelchair)?: Total Help needed standing up from a chair using your arms (e.g., wheelchair or bedside chair)?: Total Help needed to walk in hospital room?: Total Help needed climbing 3-5 steps with a railing? : Total 6 Click Score: 8    End of Session Equipment Utilized During Treatment: Oxygen Activity Tolerance: Patient limited by fatigue Patient left: in bed;with call bell/phone within reach;with bed alarm set;with nursing/sitter in room;with family/visitor present Nurse Communication: Mobility status PT Visit Diagnosis: Other abnormalities of gait and mobility (R26.89);Muscle weakness (generalized) (M62.81)    Time: 7322-0254 PT Time Calculation (min) (ACUTE ONLY): 9 min   Charges:   PT Evaluation $PT Eval Low Complexity: 1 Low          9:31 AM, 12/11/20 Mearl Latin PT, DPT Physical Therapist at [facility]

## 2020-12-11 NOTE — Progress Notes (Signed)
HOSPITAL MEDICINE OVERNIGHT EVENT NOTE    Informed by nursing that patient continues to exhibit a yellow mews score with ongoing mild tachypnea.  Patient evaluated at the bedside.  Patient is lethargic but arousable.  Patient is able to follow commands.  Patient does not appear to be in distress but is somewhat tachypneic.  Physical examination reveals the patient is responsive to verbal and painful stimuli.  Heart sounds reveal a crescendo decrescendo loud systolic murmur.  Lung sounds are mostly clear with perhaps mild bibasilar rales.  There is no evidence of any peripheral edema.  Is reassuring that the patient does not have an increased oxygen requirement.  Remainder of vital signs are stable.  Patient is due to have a chest x-ray to be performed this morning that has already been ordered by the day provider.  We will attempt to follow-up on this if this results prior to the end of the morning shift.  Vernelle Emerald  MD [facility]

## 2020-12-11 NOTE — Consult Note (Signed)
Consultation Note Date: 12/11/2020   Patient Name: [name]  DOB: [birth year]  MRN: [id number]  Age / Sex: 87 y.o., male  PCP: Redmond School, MD Referring Physician: Murlean Iba, MD  Reason for Consultation: Establishing goals of care and Hospice Evaluation  HPI/Patient Profile: 87 y.o. male  with past medical history of bilateral BKA, diastolic CHF, mod-severe aortic stenosis, NSTEMI, HTN, HLD, OSA, urinary retention secondary to BPH requiring indwelling foley catheter, CKD stage IV, diabetes, recent admission with AMS due to ESBL UTI admitted on 12/09/2020 with decreased mental status, decreased intake, fever and found to have UTI and pneumonia as well as worsening renal function.   Clinical Assessment and Goals of Care: I met today at Mr. Mase' bedside with daughter/HCPOA [name]. We discussed Mr. Verastegui' current decline and worsening renal function. [name] confirms that he has never wanted to pursue dialysis and we discussed that he would not be a good candidate for dialysis with his age, declined physical abilities, and heart disease. We discussed that even getting him back and forth to dialysis would be a struggle. We discussed poor prognosis due to renal function. [name] expresses hope that we can treat pneumonia and optimize his health with a goal to return back to his home where she and family care for him. We did discuss expectations that he will likely not be able to do as much as he did before. We discussed that if he is feeling well and having a good day and able to get out of the house (he enjoys going to church) then they can certainly do this but he may not have enough energy for this. We discussed trajectory and expectations for kidney disease and failure.   [name] and I discuss use of hospice support at home given poor prognosis. We discussed in detail the philosophy and benefits of hospice  support. We discussed use of medication to ensure comfort when needed and hospice will assist with guidance and management. We discussed that hospice would provide equipment in the home. [name] asks about medications and we reviewed that some of his medications are likely not benefiting him anymore and she would like to review these with hospice when they return home to determine what to stop or continue. [name] had many good and insightful questions regarding care and hospice support.   All questions/concerns addressed. Emotional support provided.   Primary Decision Maker HCPOA daughter [name]    SUMMARY OF RECOMMENDATIONS   - DNR in place - When medically optimized return home with support of hospice  Code Status/Advance Care Planning: DNR   Symptom Management:  No symptoms currently. Resting comfortably.   Palliative Prophylaxis:  Delirium Protocol, Frequent Pain Assessment, Oral Care, and Turn Reposition  Prognosis:  < 3 months  Discharge Planning: Home with Hospice      Primary Diagnoses: Present on Admission:  Acute metabolic encephalopathy  Acute UTI  Chronic diastolic CHF (congestive heart failure) (HCC)  Chronic kidney disease (CKD), stage IV (severe) (HCC)  Pressure injury of skin  RLL pneumonia  Peripheral vascular disease (HCC)  PAF (paroxysmal atrial fibrillation) ([place])  Hypothyroidism  Hyperlipidemia   I have reviewed the medical record, interviewed the patient and family, and examined the patient. The following aspects are pertinent.  Past Medical History:  Diagnosis Date   Arthritis    Borderline diabetic    CHF (congestive heart failure) (HCC)    Chronic kidney disease    Diabetes mellitus without complication (HCC)    Dysrhythmia    Glaucoma    Hyperlipidemia    Hypertension    Hypothyroidism    Myocardial infarction [facility])    PVD (peripheral vascular disease) ([place])    Sleep apnea    Cpap ordered but doesnt use   Urinary retention due to benign  prostatic hyperplasia 12/28/2018   Social History   Socioeconomic History   Marital status: Widowed    Spouse name: Not on file   Number of children: 3   Years of education: 33 th   Highest education level: Not on file  Occupational History   Occupation: Retired Psychologist, sport and exercise  Tobacco Use   Smoking status: Former   Smokeless tobacco: Former    Types: Nurse, children's Use: Never used  Substance and Sexual Activity   Alcohol use: No   Drug use: No   Sexual activity: Not Currently  Other Topics Concern   Not on file  Social History Narrative    Has 2 children! Very supportive.    Social Determinants of Health   Financial Resource Strain: Not on file  Food Insecurity: Not on file  Transportation Needs: Not on file  Physical Activity: Not on file  Stress: Not on file  Social Connections: Not on file   Family History  Problem Relation Age of Onset   Cancer Mother    Heart attack Father    Stroke Father    Heart attack Brother    Heart attack Brother    Scheduled Meds:  amLODipine  5 mg Oral Daily   apixaban  2.5 mg Oral BID   atorvastatin  20 mg Oral QPM   Chlorhexidine Gluconate Cloth  6 each Topical Daily   cholecalciferol  5,000 Units Oral QPM   clopidogrel  75 mg Oral Daily   ferrous sulfate  325 mg Oral Q breakfast   furosemide  40 mg Oral BID   ipratropium-albuterol  3 mL Nebulization TID   isosorbide mononitrate  15 mg Oral Daily   latanoprost  1 drop Both Eyes QHS   levothyroxine  75 mcg Oral QAC breakfast   Continuous Infusions:  sodium chloride 10 mL/hr at 12/11/20 1005   azithromycin 500 mg (12/10/20 1649)   meropenem (MERREM) IV 500 mg (12/11/20 0837)   PRN Meds:.polyethylene glycol Allergies  Allergen Reactions   Iodinated Diagnostic Agents Other (See Comments)    caused Fever   Dye Fdc Red [Red Dye] Hives    Tolerated red Docusate, Niferex   Hydralazine    Review of Systems  Constitutional:  Positive for activity change and fatigue.   Respiratory:  Positive for cough. Negative for shortness of breath.   Neurological:  Positive for weakness.   Physical Exam Vitals and nursing note reviewed.  Constitutional:      General: He is sleeping. He is not in acute distress.    Appearance: He is ill-appearing.  Cardiovascular:     Rate and Rhythm: Normal rate.  Pulmonary:     Effort: No tachypnea, accessory muscle usage or  respiratory distress.     Breath sounds: Wheezing present.  Abdominal:     Palpations: Abdomen is soft.  Neurological:     Comments: Very sleepy. Awakens and speaks but very weak. Confused.    Vital Signs: BP (!) 143/72 (BP Location: Left Arm)   Pulse 66   Temp (!) 97.5 F (36.4 C) (Oral)   Resp 20   Ht _0  (1.803 m)   Wt 98 kg   SpO2 96%   BMI 30.13 kg/m  Pain Scale: Faces   Pain Score: 0-No pain   SpO2: SpO2: 96 % O2 Device:SpO2: 96 % O2 Flow Rate: .O2 Flow Rate (L/min): 2.5 L/min  IO: Intake/output summary:  Intake/Output Summary (Last 24 hours) at 12/11/2020 1651 Last data filed at 12/11/2020 1300 Gross per 24 hour  Intake 1456.73 ml  Output 400 ml  Net 1056.73 ml    LBM: Last BM Date: 12/11/20 Baseline Weight: Weight: 98 kg Most recent weight: Weight: 98 kg     Palliative Assessment/Data:     Time In: 1545 Time Out: 1700 Time Total: 75 min Greater than 50%  of this time was spent counseling and coordinating care related to the above assessment and plan.  Signed by: Vinie Sill, NP Palliative Medicine Team Pager # [phone] (M-F 8a-5p) Team Phone # [phone] (Nights/Weekends)

## 2020-12-11 NOTE — Progress Notes (Signed)
Patient has remained a MEWS 2 since 2300. Urine output measured 400 mL. Patient responds to voice and touch. MD has been made aware and will continue to monitor patient

## 2020-12-11 NOTE — Consult Note (Signed)
[name] Admit Date: 12/09/2020 12/11/2020 Rexene Agent Requesting Physician:  Irwin Brakeman MD  Reason for Consult:  AoCKD5 HPI:  75M PMH including DM 2, hyperlipidemia, status post bilateral BKA, HFpEF, moderate/severe aortic stenosis, ASCVD, hypertension, hypothyroidism, OSA, BPH with chronic Foley catheter, recurrent UTIs, advanced CKD who presented to the ED at [facility] on 11/26 with confusion.  Evaluation identified new right lower lobe pneumonia with concern for aspiration and patient was placed on meropenem and azithromycin.  Patient with significant leukocytosis at 22.  To date blood cultures and urine cultures are no growth.  Trend in creatinine is as below.  Patient follows with Dr. Theador Hawthorne with [facility].  Those notes are available in care everywhere and the patient/family have been documented to decline any form of RRT and to focus on conservative measures when indicated.  Patient seen in room with family.  I reviewed the goals of care from outpatient nephrology which they confirmed.  Independently they asked about hospice and have been talking quite a bit about making a transition from palliative services which they currently receive to hospice.  Patient's urine output of 0.6 L yesterday.  He is 3.8 L positive since admission. Blood pressures are stable, he is afebrile, and he is using 2 L nasal cannula with normal SPO2.  Other notable labs include potassium of 4.2, serum bicarbonate of 20, hemoglobin of 9.7.   Creatinine, Ser (mg/dL)  Date Value  12/11/2020 4.17 (H)  12/10/2020 4.02 (H)  12/09/2020 3.86 (H)  10/15/2020 3.65 (H)  10/14/2020 3.46 (H)  10/13/2020 3.25 (H)  10/12/2020 3.23 (H)  10/11/2020 3.31 (H)  08/11/2020 3.68 (H)  08/10/2020 3.56 (H)  ] I/Os: I/O last 3 completed shifts: In: 3670.5 [P.O.:2040; I.V.:1280.5; IV Piggyback:350] Out: 850 [Urine:850]   ROS Patient encephalopathic and unable to complete review of  systems. PMH  Past Medical History:  Diagnosis Date   Arthritis    Borderline diabetic    CHF (congestive heart failure) (HCC)    Chronic kidney disease    Diabetes mellitus without complication ([place])    Dysrhythmia    Glaucoma    Hyperlipidemia    Hypertension    Hypothyroidism    Myocardial infarction [facility])    PVD (peripheral vascular disease) ([place])    Sleep apnea    Cpap ordered but doesnt use   Urinary retention due to benign prostatic hyperplasia 12/28/2018   PSH  Past Surgical History:  Procedure Laterality Date   ABDOMINAL AORTOGRAM W/LOWER EXTREMITY N/A 07/27/2018   Procedure: ABDOMINAL AORTOGRAM W/LOWER EXTREMITY;  Surgeon: Lorretta Harp, MD;  Location: [place] [facility];  Service: Cardiovascular;  Laterality: N/A;   AMPUTATION Right 08/28/2018   Procedure: RIGHT BELOW KNEE AMPUTATION;  Surgeon: [name], MD;  Location: [place];  Service: Orthopedics;  Laterality: Right;   AMPUTATION Left 12/02/2018   Procedure: LEFT TRANSMETATARSAL AMPUTATION AND NEGATIVE PRESSURE WOUND VAC PLACEMENT;  Surgeon: [name], MD;  Location: [place];  Service: Orthopedics;  Laterality: Left;   AMPUTATION Left 12/18/2018   Procedure: LEFT BELOW KNEE AMPUTATION;  Surgeon: [name], MD;  Location: [place];  Service: Orthopedics;  Laterality: Left;   APPENDECTOMY  1943   BELOW KNEE LEG AMPUTATION Left 12/18/2018   CATARACT EXTRACTION  2012   x2   COLONOSCOPY N/A 11/01/2014   Procedure: COLONOSCOPY;  Surgeon: Aviva Signs, MD;  [facility];  Service: Gastroenterology;  Laterality: N/A;   ESOPHAGOGASTRODUODENOSCOPY N/A 11/01/2014  Procedure: ESOPHAGOGASTRODUODENOSCOPY (EGD);  Surgeon: Aviva Signs, MD;  [facility];  Service: Gastroenterology;  Laterality: N/A;   HERNIA REPAIR Left    INGUINAL HERNIA REPAIR Right 03/01/2013   Procedure: RIGHT INGUINAL HERNIORRHAPHY;  Surgeon: Jamesetta So, MD;  Location: [facility];  Service: General;  Laterality: Right;    INSERTION OF MESH Right 03/01/2013   Procedure: INSERTION OF MESH;  Surgeon: Jamesetta So, MD;  Location: [facility];  Service: General;  Laterality: Right;   LOWER EXTREMITY ANGIOGRAPHY Right 08/25/2018   Procedure: LOWER EXTREMITY ANGIOGRAPHY;  Surgeon: Wellington Hampshire, MD;  Location: [place] [facility];  Service: Cardiovascular;  Laterality: Right;   LOWER EXTREMITY ANGIOGRAPHY N/A 11/25/2018   Procedure: LOWER EXTREMITY ANGIOGRAPHY;  Surgeon: Wellington Hampshire, MD;  Location: [place] [facility];  Service: Cardiovascular;  Laterality: N/A;   NM MYOCAR PERF WALL MOTION  06/05/2010   no significant ischemia   PERIPHERAL VASCULAR ATHERECTOMY Right 08/03/2018   Procedure: PERIPHERAL VASCULAR ATHERECTOMY;  Surgeon: Lorretta Harp, MD;  Location: [place] [facility];  Service: Cardiovascular;  Laterality: Right;   PERIPHERAL VASCULAR ATHERECTOMY Left 11/23/2018   Procedure: PERIPHERAL VASCULAR ATHERECTOMY;  Surgeon: Lorretta Harp, MD;  Location: [place] [facility];  Service: Cardiovascular;  Laterality: Left;  sfa with dc balloon   PERIPHERAL VASCULAR BALLOON ANGIOPLASTY Left 11/25/2018   Procedure: PERIPHERAL VASCULAR BALLOON ANGIOPLASTY;  Surgeon: Wellington Hampshire, MD;  Location: [place] [facility];  Service: Cardiovascular;  Laterality: Left;  popliteal   PERIPHERAL VASCULAR INTERVENTION Left 11/25/2018   Procedure: PERIPHERAL VASCULAR INTERVENTION;  Surgeon: Wellington Hampshire, MD;  Location: [place] [facility];  Service: Cardiovascular;  Laterality: Left;  tibial peroneal trunk and peroneal stents    stent  06/14/2010   left leg   STUMP REVISION Left 01/20/2019   Procedure: REVISION LEFT BELOW KNEE AMPUTATION;  Surgeon: [name], MD;  Location: [place];  Service: Orthopedics;  Laterality: Left;   US ECHOCARDIOGRAPHY  01/21/2006   moderate mitral annular ca+, mild MR, AOV moderately sclerotic.   FH  Family History  Problem Relation Age of Onset   Cancer Mother    Heart attack Father     Stroke Father    Heart attack Brother    Heart attack Brother    [place]  reports that he has quit smoking. He has quit using smokeless tobacco.  His smokeless tobacco use included chew. He reports that he does not drink alcohol and does not use drugs. Allergies  Allergies  Allergen Reactions   Iodinated Diagnostic Agents Other (See Comments)    caused Fever   Dye Fdc Red [Red Dye] Hives    Tolerated red Docusate, Niferex   Hydralazine    Home medications Prior to Admission medications   Medication Sig Start Date End Date Taking? Authorizing Provider  acetaminophen (TYLENOL) 500 MG tablet Take 500 mg by mouth every 6 (six) hours as needed for headache (pain).   Yes [provider]  amLODipine (NORVASC) 5 MG tablet Take 5 mg by mouth daily. 07/24/20  Yes [provider]  apixaban (ELIQUIS) 2.5 MG TABS tablet TAKE 1 TABLET(2.5 MG) BY MOUTH TWICE DAILY 10/05/20  Yes Almyra Deforest, PA  Ascorbic Acid (VITAMIN C) 1000 MG tablet Take 1,000 mg by mouth daily.    Yes [provider]  atorvastatin (LIPITOR) 40 MG tablet TAKE 1 TABLET(40 MG) BY MOUTH EVERY EVENING 07/19/20  Yes Hilty, Nadean Corwin, MD  carvedilol (  COREG) 3.125 MG tablet TAKE 1 TABLET(3.125 MG) BY MOUTH TWICE DAILY WITH A MEAL 09/15/20  Yes Hilty, Nadean Corwin, MD  Cholecalciferol (VITAMIN D-3) 125 MCG (5000 UT) TABS Take 5,000 Units by mouth every evening.   Yes [provider]  clopidogrel (PLAVIX) 75 MG tablet TAKE 1 TABLET(75 MG) BY MOUTH DAILY WITH BREAKFAST 08/28/20  Yes Hilty, Nadean Corwin, MD  co-enzyme Q-10 30 MG capsule Take 100 mg by mouth daily.   Yes [provider]  doxazosin (CARDURA) 2 MG tablet TAKE 1 TABLET(2 MG) BY MOUTH DAILY 10/26/20  Yes Lendon Colonel, NP  famotidine (PEPCID) 20 MG tablet Take 1 tablet (20 mg total) by mouth daily. 11/12/18  Yes Johnson, Clanford L, MD  ferrous sulfate 325 (65 FE) MG tablet Take 325 mg by mouth 2 (two) times daily with a meal.   Yes [provider]  furosemide (LASIX) 40 MG tablet Take 2 tablets (80 mg total) by mouth daily. Take 2 tablets (80mg ) once daily in the morning. Can take an additional 1 tablet (40mg ) in the afternoon as needed for weight gain (3lbs in 1 day or 5lbs in 1 week). Patient taking differently: Take 40 mg by mouth 2 (two) times daily. Take 2 tablets (80mg ) once daily in the morning. Can take an additional 1 tablet (40mg ) in the afternoon as needed for weight gain (3lbs in 1 day or 5lbs in 1 week). 06/19/20 12/09/20 Yes Barrett, Evelene Croon, PA-C  glipiZIDE (GLUCOTROL) 10 MG tablet Take 1 tablet (10 mg total) by mouth daily before breakfast. 10/15/20  Yes Barton Dubois, MD  isosorbide mononitrate (IMDUR) 30 MG 24 hr tablet Take 1 tablet (30 mg total) by mouth daily. 10/15/20  Yes Barton Dubois, MD  latanoprost (XALATAN) 0.005 % ophthalmic solution Place 1 drop into both eyes at bedtime. 09/30/19  Yes [provider]  levothyroxine (SYNTHROID) 75 MCG tablet Take 75 mcg by mouth daily before breakfast.  02/12/19  Yes [provider]  nitroGLYCERIN (NITROSTAT) 0.4 MG SL tablet Place 1 tablet (0.4 mg total) under the tongue every 5 (five) minutes x 3 doses as needed for chest pain. 06/19/20  Yes Barrett, Evelene Croon, PA-C  polyethylene glycol (MIRALAX) 17 g packet Take 17 g by mouth daily as needed for moderate constipation. 12/09/18  Yes Mercy Riding, MD  potassium chloride SA (KLOR-CON) 20 MEQ tablet Take 2 tablets (40 mEq total) by mouth daily. 06/20/20  Yes Barrett, Evelene Croon, PA-C  pyridoxine (B-6) 100 MG tablet Take 100 mg by mouth daily.   Yes [provider]  traMADol (ULTRAM) 50 MG tablet Take 1 tablet (50 mg total) by mouth every 6 (six) hours as needed. 02/15/19  Yes Lovorn, Megan, MD  vitamin E 400 UNIT capsule Take 400 Units by mouth daily.   Yes [provider]  zinc gluconate 50 MG tablet Take 50 mg by mouth daily.   Yes [provider]  betamethasone dipropionate 0.05 %  cream Apply 1 application topically 2 (two) times daily. Patient not taking: Reported on 12/09/2020 12/03/19   [provider]  [facility] ULTRA test strip 1 each 2 (two) times daily. 04/15/19   [provider]    Current Medications Scheduled Meds:  amLODipine  5 mg Oral Daily   apixaban  2.5 mg Oral BID   atorvastatin  20 mg Oral QPM   Chlorhexidine Gluconate Cloth  6 each Topical Daily   cholecalciferol  5,000 Units Oral QPM  clopidogrel  75 mg Oral Daily   ferrous sulfate  325 mg Oral Q breakfast   furosemide  40 mg Oral BID   ipratropium-albuterol  3 mL Nebulization TID   isosorbide mononitrate  15 mg Oral Daily   latanoprost  1 drop Both Eyes QHS   levothyroxine  75 mcg Oral QAC breakfast   Continuous Infusions:  sodium chloride 10 mL/hr at 12/11/20 1005   azithromycin 500 mg (12/10/20 1649)   meropenem (MERREM) IV 500 mg (12/11/20 0837)   PRN Meds:.polyethylene glycol  CBC Recent Labs  Lab 12/09/20 1243 12/10/20 0242 12/11/20 0313  WBC 22.1* 16.6* 14.6*  NEUTROABS 18.8*  --   --   HGB 10.8* 9.6* 9.7*  HCT 31.7* 29.3* 29.7*  MCV 101.3* 103.2* 104.6*  PLT 161 136* 017   Basic Metabolic Panel Recent Labs  Lab 12/09/20 1243 12/10/20 0242 12/11/20 0313  NA 130* 135 136  K 3.8 3.7 4.2  CL 99 104 105  CO2 19* 20* 20*  GLUCOSE 138* 86 64*  BUN 58* 58* 65*  CREATININE 3.86* 4.02* 4.17*  CALCIUM 8.8* 8.5* 8.6*  PHOS  --   --  4.7*    Physical Exam   Blood pressure 137/67, pulse 64, temperature 97.7 F (36.5 C), temperature source Oral, resp. rate (!) 22, height 5\' 11"  (1.803 m), weight 98 kg, SpO2 97 %. GEN: Elderly male, somnolent, not participatory, no apparent distress ENT: NCAT EYES: Eyes closed CV: Regular with 3/6 MSM PULM: Audible expiratory wheezing without stethoscope consistent with upper airway, no wheezing on auscultation ABD: s/nt/nd SKIN: No rashes or lesions EXT: No edema in the bilateral BKA's   Assessment 19M AoCKD5,  RLL PNA, ? UTI, progressive FTT  AoCKD5, not surprising in context of #2; patient/family opts for conservative care in both outpatient and inpatient conversations RLL Pneumonia on meropenem/azithro, per TRH; B Cx NGTD ? UTI, Cx pending, chronic foley catheter Leukocytosis, improving, related to #2 Bilateral BKA's Progressive failure to thrive HFpEF Moderate to severe aortic stenosis Recurrent UTIs Encephalopathy Anemia  Plan Patient and family do not desire any form of RRT, which is reasonable They also are interested in learning more about hospice, also reasonable No specific renal recommendations other than continued supportive care   Rexene Agent  12/11/2020, 10:20 AM

## 2020-12-11 NOTE — Progress Notes (Signed)
OT Cancellation Note  Patient Details Name: [name] MRN: [id number] DOB: April 04, 1927   Cancelled Treatment:    Reason Eval/Treat Not Completed: OT screened, no needs identified, will sign off. Patient is currently functioning at baseline. He is wheelchair bound due to bilateral BKA. He receives assistance at home to complete ADL tasks. He has all necessary DME. No follow up OT services needed at this time.     Ailene Ravel, OTR/L,CBIS  [phone]  12/11/2020, 9:35 AM

## 2020-12-11 NOTE — Progress Notes (Signed)
SLP Cancellation Note  Patient Details Name: VRAJ DENARDO MRN: [id number] DOB: 11/17/1927   Cancelled treatment:       Reason Eval/Treat Not Completed: Fatigue/lethargy limiting ability to participate; Pt sleeping soundly after having had a breathing treatment. Pt's daughter at bedside and reports that Pt seemed to do well with eating and drinking today when he was alert. RN reports the same and that he took his medication whole with water per his request. SLP will check back tomorrow. Hospice may become involved per hospitalist.  Thank you,  Genene Churn, [place]    [place] 12/11/2020, 4:54 PM

## 2020-12-11 NOTE — Progress Notes (Signed)
PROGRESS NOTE   [name]  WRU:[id number] DOB: 1927-08-26 DOA: 12/09/2020 PCP: Redmond School, MD   Chief Complaint  Patient presents with   Fever   Level of care: Med-Surg  Brief Admission History:   87 y.o. male with DM type II, HLD, PVD, s/p bilateral lower extremity BKA, chronic diastolic CHF, moderate to severe aortic stenosis, s/p NSTEMI, HTN, hypothyroidism, OSA (not on CPAP), urinary retention secondary to BPH with chronic indwelling Foley catheter.  CKD stage IV, patient with recent hospitalization due to altered mental status from ESBL UTI, he was recently discharged on 10/15/2020. -Patient was brought by his son due to decreased mental status, decreased responsiveness, symptoms has been ongoing for last 48 hours, decreased oral Intake and appetite, Foley catheter was exchanged before few days, and with multiple UTIs in the past requiring IV antibiotics, and himself cannot provide any complaints.  -In ED patient was noted altered, not communicative which is far from his baseline per son, work-up significant for sodium of 133, creatinine 3.8 around baseline, white blood cell count of 22,000, lactic acid within normal limit at 1.6, UA is positive, chest x-ray significant for right lower lobe infiltrate, [facility] hospitalist consulted to admit  Assessment & Plan:   Principal Problem:   Urinary tract infection associated with indwelling urethral catheter ([place]) Active Problems:   RLL pneumonia   Peripheral vascular disease ([place])   Hyperlipidemia   Hypothyroidism   Chronic diastolic CHF (congestive heart failure) (HCC)   PAF (paroxysmal atrial fibrillation) ([place])   Acquired absence of right leg below knee ([place])   Chronic kidney disease (CKD), stage IV (severe) (HCC)   Pressure injury of skin   Acute metabolic encephalopathy   Acute UTI   Dehydration   Fever  Acute metabolic encephalopathy  - secondary to MDR UTI and pneumonia  - continue IV antibiotics and supportive  measures  - slowly improving with treatments. - his renal function unfortunately is not improving   RLL pneumonia  - suspect he had a single aspiration event - continue IV antibiotics - SLP consult appreciated to check swallow function  - dysphagia diet ordered  Moderate left pleural effusion  - we have been hesitant to order thoracentesis due to patient being on plavix and apixaban - palliative consult for goals of care discussions  - given advanced age, frailty, disability, benefits of a procedure like this likely do not outweigh risks  Dysphagia  - appreciate SLP evaluation  - Dys 3 diet ordered   MDR UTI  - recently tested positive for ESBL E coli (likely colonized with it) - urinalysis is very abnormal and with acute symptoms will treat - continue meropenem as ordered, continue for at least 3 days  Type 2 diabetes mellitus with vascular complications with hypoglycemia - DC SSI due  CBG (last 3)  Recent Labs    12/10/20 2042 12/11/20 0743 12/11/20 0847  GLUCAP 82 64* 87    Stage V CKD  -eGFR is now less than 15  - renally dose medications as able - resume home lasix tablets twice daily  - discussed with nephrologist, it is time to focus on palliative measures - pt is not a candidate for HD treatment for multiple reasons  PAF  - resume home apixaban  - holding carvedilol due to HR<65   Severe PVD - he remains on plavix for the time being  Dehydration  - temporarily held diuretics - gently hydrated with IV fluids  Hypothyroidism  - resume home medication  DVT prophylaxis: apixaban  Code Status: DNR  Family Communication: sons 11/27  Disposition: TBD  Status is: Inpatient  Remains inpatient appropriate because: IV antibiotics required   Consultants:  SLP  Procedures:  N/a   Antimicrobials:  Meropenem 11/26>> Azithromycin 11/26>>   Subjective: Pt complains of being cold this morning, had some low BS, eating breakfast with assistance.    Objective: Vitals:   12/10/20 2208 12/11/20 0008 12/11/20 0400 12/11/20 0829  BP: 135/73 135/85 135/74 137/67  Pulse: 64 66 60 64  Resp: (!) 24 (!) 25 (!) 24 (!) 22  Temp: 97.8 F (36.6 C) 97.6 F (36.4 C) 97.7 F (36.5 C) 97.7 F (36.5 C)  TempSrc: Axillary Axillary Axillary Oral  SpO2: 94% 96% 99% 97%  Weight:      Height:        Intake/Output Summary (Last 24 hours) at 12/11/2020 9169 Last data filed at 12/11/2020 4503 Gross per 24 hour  Intake 2406.8 ml  Output 400 ml  Net 2006.8 ml   Filed Weights   12/09/20 1146  Weight: 98 kg    Examination:  General exam: elderly male with dementia, Appears acutely ill and chronically ill.   Respiratory system: Clear to auscultation. Respiratory effort normal. Cardiovascular system: normal S1 & S2 heard. No JVD, murmurs, rubs, gallops or clicks. No pedal edema. Gastrointestinal system: Abdomen is nondistended, soft and nontender. No organomegaly or masses felt. Normal bowel sounds heard. Central nervous system: Alert and oriented. No focal neurological deficits. Extremities: Symmetric 5 x 5 power. Skin: No rashes, lesions or ulcers Psychiatry: Judgement and insight appear unable to determine. Mood & affect flat.   Data Reviewed: I have personally reviewed following labs and imaging studies  CBC: Recent Labs  Lab 12/09/20 1243 12/10/20 0242 12/11/20 0313  WBC 22.1* 16.6* 14.6*  NEUTROABS 18.8*  --   --   HGB 10.8* 9.6* 9.7*  HCT 31.7* 29.3* 29.7*  MCV 101.3* 103.2* 104.6*  PLT 161 136* 888    Basic Metabolic Panel: Recent Labs  Lab 12/09/20 1243 12/10/20 0242 12/11/20 0313  NA 130* 135 136  K 3.8 3.7 4.2  CL 99 104 105  CO2 19* 20* 20*  GLUCOSE 138* 86 64*  BUN 58* 58* 65*  CREATININE 3.86* 4.02* 4.17*  CALCIUM 8.8* 8.5* 8.6*  MG  --   --  2.2  PHOS  --   --  4.7*    GFR: Estimated Creatinine Clearance: 13.8 mL/min (A) (by C-G formula based on SCr of 4.17 mg/dL (H)).  Liver Function  Tests: Recent Labs  Lab 12/09/20 1243 12/11/20 0313  AST 76*  --   ALT 20  --   ALKPHOS 35*  --   BILITOT 0.7  --   PROT 7.2  --   ALBUMIN 3.4* 3.0*    CBG: Recent Labs  Lab 12/10/20 1125 12/10/20 1628 12/10/20 2042 12/11/20 0743 12/11/20 0847  GLUCAP 118* 94 82 64* 87    Recent Results (from the past 240 hour(s))  Culture, blood (Routine x 2)     Status: None (Preliminary result)   Collection Time: 12/09/20 12:00 PM   Specimen: BLOOD RIGHT FOREARM  Result Value Ref Range Status   Specimen Description   Final    BLOOD RIGHT FOREARM BOTTLES DRAWN AEROBIC AND ANAEROBIC   Special Requests Blood Culture adequate volume  Final   Culture   Final    NO GROWTH 2 DAYS Performed at [facility], [address] [place], [place]  [address]    Report Status PENDING  Incomplete  Culture, blood (Routine x 2)     Status: None (Preliminary result)   Collection Time: 12/09/20 12:43 PM   Specimen: BLOOD LEFT FOREARM  Result Value Ref Range Status   Specimen Description   Final    BLOOD LEFT FOREARM BOTTLES DRAWN AEROBIC AND ANAEROBIC   Special Requests Blood Culture adequate volume  Final   Culture   Final    NO GROWTH 2 DAYS Performed at [facility], [address], [place], [place] [address]    Report Status PENDING  Incomplete  Resp Panel by RT-PCR (Flu A&B, Covid) Nasopharyngeal Swab     Status: None   Collection Time: 12/09/20  2:16 PM   Specimen: Nasopharyngeal Swab; Nasopharyngeal(NP) swabs in vial transport medium  Result Value Ref Range Status   SARS Coronavirus 2 by RT PCR NEGATIVE NEGATIVE Final    Comment: (NOTE) SARS-CoV-2 target nucleic acids are NOT DETECTED.  The SARS-CoV-2 RNA is generally detectable in upper respiratory specimens during the acute phase of infection. The lowest concentration of SARS-CoV-2 viral copies this assay can detect is 138 copies/mL. A negative result does not preclude SARS-Cov-2 infection and should not be used as the sole basis for  treatment or other patient management decisions. A negative result may occur with  improper specimen collection/handling, submission of specimen other than nasopharyngeal swab, presence of viral mutation(s) within the areas targeted by this assay, and inadequate number of viral copies(<138 copies/mL). A negative result must be combined with clinical observations, patient history, and epidemiological information. The expected result is Negative.  Fact Sheet for Patients:  EntrepreneurPulse.com.au  Fact Sheet for Healthcare Providers:  IncredibleEmployment.be  This test is no t yet approved or cleared by the [place] FDA and  has been authorized for detection and/or diagnosis of SARS-CoV-2 by FDA under an Emergency Use Authorization (EUA). This EUA will remain  in effect (meaning this test can be used) for the duration of the COVID-19 declaration under Section 564(b)(1) of the Act, 21 U.S.C.section 360bbb-3(b)(1), unless the authorization is terminated  or revoked sooner.       Influenza A by PCR NEGATIVE NEGATIVE Final   Influenza B by PCR NEGATIVE NEGATIVE Final    Comment: (NOTE) The Xpert Xpress SARS-CoV-2/FLU/RSV plus assay is intended as an aid in the diagnosis of influenza from Nasopharyngeal swab specimens and should not be used as a sole basis for treatment. Nasal washings and aspirates are unacceptable for Xpert Xpress SARS-CoV-2/FLU/RSV testing.  Fact Sheet for Patients: EntrepreneurPulse.com.au  Fact Sheet for Healthcare Providers: IncredibleEmployment.be  This test is not yet approved or cleared by the [place] FDA and has been authorized for detection and/or diagnosis of SARS-CoV-2 by FDA under an Emergency Use Authorization (EUA). This EUA will remain in effect (meaning this test can be used) for the duration of the COVID-19 declaration under Section 564(b)(1) of the Act, 21  U.S.C. section 360bbb-3(b)(1), unless the authorization is terminated or revoked.  Performed at [facility], [address] [place], [place] [address]      Radiology Studies: DG Chest 1 View  Result Date: 12/09/2020 CLINICAL DATA:  Fever, suspected sepsis. History of multiple UTIs with recent Foley catheter change. EXAM: CHEST  1 VIEW COMPARISON:  Chest radiograph 10/11/2020 FINDINGS: Persistent low lung volumes. Unchanged confluent opacity at the left lung base compared to 10/11/2020 with layering pleural fluid. New small patchy airspace opacity in the medial right lower lobe. No right pleural  effusion. No pneumothorax. Stable cardiomegaly. No acute osseous abnormality. IMPRESSION: 1. New small patchy airspace opacity in the medial right lower lobe could represent infection versus aspiration related changes. 2. Unchanged confluent airspace opacity in the left lower lobe, favored to represent moderate effusion with left lower lobe atelectasis, though infection is not excluded. 3. Stable cardiomegaly. Electronically Signed   By: Ileana Roup M.D.   On: 12/09/2020 12:29   DG CHEST PORT 1 VIEW  Result Date: 12/11/2020 CLINICAL DATA:  Aspiration pneumonia EXAM: PORTABLE CHEST 1 VIEW COMPARISON:  December 09, 2020 FINDINGS: Evaluation is limited secondary to rotation. The cardiomediastinal silhouette is unchanged in contour. Moderate LEFT pleural effusion, unchanged. Trace fluid in the minor fissure. No pneumothorax. Unchanged homogeneous LEFT basilar opacity. Scattered RIGHT basilar heterogeneous opacities, similar in comparison to prior. Visualized abdomen is unremarkable. Multilevel degenerative changes of the thoracic spine. IMPRESSION: Similar appearance of a moderate LEFT pleural effusion with homogeneous LEFT basilar opacity and scattered RIGHT basilar opacities. Electronically Signed   By: Valentino Saxon M.D.   On: 12/11/2020 07:49    Scheduled Meds:  amLODipine  5 mg Oral Daily    apixaban  2.5 mg Oral BID   atorvastatin  20 mg Oral QPM   Chlorhexidine Gluconate Cloth  6 each Topical Daily   cholecalciferol  5,000 Units Oral QPM   clopidogrel  75 mg Oral Daily   ferrous sulfate  325 mg Oral Q breakfast   furosemide  40 mg Oral BID   insulin aspart  0-9 Units Subcutaneous TID WC   isosorbide mononitrate  15 mg Oral Daily   latanoprost  1 drop Both Eyes QHS   levothyroxine  75 mcg Oral QAC breakfast   Continuous Infusions:  sodium chloride 30 mL/hr at 12/11/20 0300   azithromycin 500 mg (12/10/20 1649)   meropenem (MERREM) IV 500 mg (12/11/20 0837)     LOS: 2 days   Time spent: 35 mins   Dannon Perlow Wynetta Emery, MD How to contact the [facility] Attending or Consulting provider [place] or covering provider during after hours [place], for this patient?  Check the care team in [facility] and look for a) attending/consulting [facility] provider listed and b) the [facility] team listed Log into [email or web address] and use [place]'s universal password to access. If you do not have the password, please contact the hospital operator. Locate the [facility] provider you are looking for under [facility] and page to a number that you can be directly reached. If you still have difficulty reaching the provider, please page the [facility] (Director on Call) for the Hospitalists listed on amion for assistance.  12/11/2020, 9:38 AM

## 2020-12-12 DIAGNOSIS — Z515 Encounter for palliative care: Secondary | ICD-10-CM

## 2020-12-12 DIAGNOSIS — Z7189 Other specified counseling: Secondary | ICD-10-CM

## 2020-12-12 DIAGNOSIS — R0602 Shortness of breath: Secondary | ICD-10-CM

## 2020-12-12 LAB — RENAL FUNCTION PANEL
Albumin: 3.1 g/dL — ABNORMAL LOW (ref 3.5–5.0)
Anion gap: 11 (ref 5–15)
BUN: 75 mg/dL — ABNORMAL HIGH (ref 8–23)
CO2: 18 mmol/L — ABNORMAL LOW (ref 22–32)
Calcium: 8.6 mg/dL — ABNORMAL LOW (ref 8.9–10.3)
Chloride: 105 mmol/L (ref 98–111)
Creatinine, Ser: 4.25 mg/dL — ABNORMAL HIGH (ref 0.61–1.24)
GFR, Estimated: 13 mL/min — ABNORMAL LOW (ref >60)
Glucose, Bld: 162 mg/dL — ABNORMAL HIGH (ref 70–99)
Phosphorus: 5.3 mg/dL — ABNORMAL HIGH (ref 2.5–4.6)
Potassium: 4.4 mmol/L (ref 3.5–5.1)
Sodium: 134 mmol/L — ABNORMAL LOW (ref 135–145)

## 2020-12-12 LAB — GLUCOSE, CAPILLARY
Glucose-Capillary: 161 mg/dL — ABNORMAL HIGH (ref 70–99)
Glucose-Capillary: 154 mg/dL — ABNORMAL HIGH (ref 70–99)
Glucose-Capillary: 142 mg/dL — ABNORMAL HIGH (ref 70–99)
Glucose-Capillary: 140 mg/dL — ABNORMAL HIGH (ref 70–99)

## 2020-12-12 LAB — CBC
HCT: 29.7 % — ABNORMAL LOW (ref 39.0–52.0)
Hemoglobin: 9.7 g/dL — ABNORMAL LOW (ref 13.0–17.0)
MCH: 33.8 pg (ref 26.0–34.0)
MCHC: 32.7 g/dL (ref 30.0–36.0)
MCV: 103.5 fL — ABNORMAL HIGH (ref 80.0–100.0)
Platelets: 170 10*3/uL (ref 150–400)
RBC: 2.87 MIL/uL — ABNORMAL LOW (ref 4.22–5.81)
RDW: 14.3 % (ref 11.5–15.5)
WBC: 11.2 10*3/uL — ABNORMAL HIGH (ref 4.0–10.5)
nRBC: 0 % (ref 0.0–0.2)

## 2020-12-12 MED ORDER — FENTANYL CITRATE PF 50 MCG/ML IJ SOSY
12.5000 ug | PREFILLED_SYRINGE | INTRAMUSCULAR | Status: DC | PRN
  Administered 2020-12-12 – 2020-12-13 (×7): 12.5 ug via INTRAVENOUS
  Filled 2020-12-12 (×5): qty 1

## 2020-12-12 MED ORDER — AZITHROMYCIN 250 MG PO TABS
ORAL_TABLET | ORAL | Status: AC
  Filled 2020-12-12: qty 2

## 2020-12-12 MED ORDER — FENTANYL CITRATE PF 50 MCG/ML IJ SOSY
PREFILLED_SYRINGE | INTRAMUSCULAR | Status: AC
  Filled 2020-12-12: qty 1

## 2020-12-12 MED ORDER — FENTANYL CITRATE PF 50 MCG/ML IJ SOSY
PREFILLED_SYRINGE | INTRAMUSCULAR | Status: AC
  Administered 2020-12-12: 12.5 ug
  Filled 2020-12-12: qty 1

## 2020-12-12 MED ORDER — IPRATROPIUM-ALBUTEROL 0.5-2.5 (3) MG/3ML IN SOLN
3.0000 mL | Freq: Three times a day (TID) | RESPIRATORY_TRACT | Status: DC
  Administered 2020-12-13: 3 mL via RESPIRATORY_TRACT
  Filled 2020-12-12: qty 3

## 2020-12-12 MED ORDER — AZITHROMYCIN 250 MG PO TABS
500.0000 mg | ORAL_TABLET | Freq: Every day | ORAL | Status: AC
  Administered 2020-12-12: 500 mg via ORAL
  Filled 2020-12-12: qty 2

## 2020-12-12 NOTE — Progress Notes (Signed)
Pharmacy Antibiotic Note  [name] is a 87 y.o. male admitted on 12/09/2020 with  hx of ESBL UTI .  Pharmacy has been consulted for meropenem dosing. Renal function worsening.   Plan: Continue Meropenem 500 mg IV every 12 hours. F/U progress and LOT Monitor V/S, labs  Height: 5\' 11"  (180.3 cm) Weight: 98 kg (216 lb 0.8 oz) IBW/kg (Calculated) : 75.3  Temp (24hrs), Avg:98.2 F (36.8 C), Min:97.5 F (36.4 C), Max:98.5 F (36.9 C)  Recent Labs  Lab 12/09/20 1214 12/09/20 1243 12/10/20 0242 12/11/20 0313 12/12/20 0503  WBC  --  22.1* 16.6* 14.6* 11.2*  CREATININE  --  3.86* 4.02* 4.17* 4.25*  LATICACIDVEN 1.6  --   --   --   --      Estimated Creatinine Clearance: 13.5 mL/min (A) (by C-G formula based on SCr of 4.25 mg/dL (H)).    Allergies  Allergen Reactions   Iodinated Diagnostic Agents Other (See Comments)    caused Fever   Dye Fdc Red [Red Dye] Hives    Tolerated red Docusate, Niferex   Hydralazine     Antimicrobials this admission: Merrem 11/26 >>  Microbiology results: 11/26 BCx: ngtd 11/26 UCx: > 100K CFU/ml K. Pneum 9/28 Ucx: ESBL klebsiella   Thank you for allowing pharmacy to be a part of this patient's care.  Isac Sarna, BS Pharm D, [place] Clinical Pharmacist Pager [phone] 12/12/2020 9:06 AM

## 2020-12-12 NOTE — Progress Notes (Signed)
Admit: 12/09/2020 LOS: 3  58M AoCKD5, RLL PNA, ? UTI, progressive FTT  Subjective:  Family has decided to pursue hospice upon discharge Kidney function stable Patient intermittently awakens, taking some p.o.  11/28 0701 - 11/29 0700 In: 960 [P.O.:960] Out: 500 [Urine:500]  Filed Weights   12/09/20 1146  Weight: 98 kg    Scheduled Meds:  amLODipine  5 mg Oral Daily   apixaban  2.5 mg Oral BID   atorvastatin  20 mg Oral QPM   Chlorhexidine Gluconate Cloth  6 each Topical Daily   cholecalciferol  5,000 Units Oral QPM   clopidogrel  75 mg Oral Daily   ferrous sulfate  325 mg Oral Q breakfast   furosemide  40 mg Oral BID   ipratropium-albuterol  3 mL Nebulization TID   isosorbide mononitrate  15 mg Oral Daily   latanoprost  1 drop Both Eyes QHS   levothyroxine  75 mcg Oral QAC breakfast   Continuous Infusions:  sodium chloride 10 mL/hr at 12/11/20 1005   azithromycin 500 mg (12/11/20 1653)   meropenem (MERREM) IV 500 mg (12/12/20 0639)   PRN Meds:.polyethylene glycol  Current Labs: reviewed    Physical Exam:  Blood pressure (!) 154/83, pulse 84, temperature 98.5 F (36.9 C), temperature source Axillary, resp. rate 20, height 5\' 11"  (1.803 m), weight 98 kg, SpO2 94 %. GEN: Elderly male, somnolent, not participatory, no apparent distress ENT: NCAT EYES: Eyes closed CV: Regular with 3/6 MSM PULM: Audible expiratory wheezing without stethoscope consistent with upper airway, no wheezing on auscultation ABD: s/nt/nd SKIN: No rashes or lesions EXT: No edema in the bilateral BKA's  A AoCKD5, not surprising in context of #2; patient/family opts for conservative care in both outpatient and inpatient conversations RLL Pneumonia on meropenem/azithro, per TRH; B Cx NGTD ? UTI, Klebsiella, chronic foley catheter Leukocytosis, improving, related to #2 Bilateral BKA's Progressive failure to thrive HFpEF Moderate to severe aortic stenosis Recurrent  UTIs Encephalopathy Anemia  P Support decision to pursue hospice after treating fixable problems No specific renal recommendations Will sign off at the current time   Pearson Grippe MD 12/12/2020, 9:21 AM  Recent Labs  Lab 12/10/20 0242 12/11/20 0313 12/12/20 0503  NA 135 136 134*  K 3.7 4.2 4.4  CL 104 105 105  CO2 20* 20* 18*  GLUCOSE 86 64* 162*  BUN 58* 65* 75*  CREATININE 4.02* 4.17* 4.25*  CALCIUM 8.5* 8.6* 8.6*  PHOS  --  4.7* 5.3*   Recent Labs  Lab 12/09/20 1243 12/10/20 0242 12/11/20 0313 12/12/20 0503  WBC 22.1* 16.6* 14.6* 11.2*  NEUTROABS 18.8*  --   --   --   HGB 10.8* 9.6* 9.7* 9.7*  HCT 31.7* 29.3* 29.7* 29.7*  MCV 101.3* 103.2* 104.6* 103.5*  PLT 161 136* 151 170

## 2020-12-12 NOTE — Progress Notes (Signed)
Palliative:  HPI: 87 y.o. male  with past medical history of bilateral BKA, diastolic CHF, mod-severe aortic stenosis, NSTEMI, HTN, HLD, OSA, urinary retention secondary to BPH requiring indwelling foley catheter, CKD stage IV, diabetes, recent admission with AMS due to ESBL UTI admitted on 12/09/2020 with decreased mental status, decreased intake, fever and found to have UTI and pneumonia as well as worsening renal function.    I met again today at Ms. Speir' bedside with daughter/HCPOA [name]. Mr. Fudala is alert at times but with dyspnea and he appears much worse that yesterday. [name] shares with me that he had some visual hallucinations and has also expressed that he thought he was going to die. I had a conversation with [name] regarding my concern for his decline. We discussed that he seems to be making a turn towards end of life much quicker than we thought when we met yesterday. We discussed symptom management and [name] agrees as she does not want her father to suffer so we will provide some medication to provide some relief from his dyspnea. We also discussed calling in family so they can make their way to bedside and into town to spend time with Ms. Sessler. Given his decline from yesterday I am not sure how much longer he will be able to awaken and interact and I do not want family to miss opportunity to be with him if desired.   [name] and I also discussed where to go from here. We discussed hospice options at home vs hospice facility. After discussion it was decided to notify family members today and monitor and support him over the next 24 hours. If no improvement or if he worsens she would be more interested in hospice facility if he will not even be aware that he is at home. We will talk more tomorrow to make a more informed decision. Encouraged [name] to call or contact me if I can assist in communication or questions with her family.   All questions/concerns addressed. Emotional support provided. Updated  Dr. Wynetta Emery, [facility] RN, [facility] via Epic secure chat.   Exam: Arousable. Able to speak but mumbled and difficult to understand. Confused. Shallow tachypneic breathes. Abd nondistended. Warm to touch.   Plan: - Trial low dose fentanyl for dyspnea relief. May increase dosage to achieve relief. If not effective or not lasting long enough may consider low dose dilaudid.  - Notifying family of decline. Unrestricted visitation ordered.  - Will reassess in am for home with hospice vs hospice facility.   [place], NP Palliative Medicine Team Pager [phone] (Please see [email or web address] for schedule) Team Phone [phone]    Greater than 50%  of this time was spent counseling and coordinating care related to the above assessment and plan

## 2020-12-12 NOTE — Progress Notes (Signed)
Palliative:  Full note to follow. Discussion with HCPOA/daughter [name]. Discussed prognosis and expectations. Discussed hospice support at home. [name] would like to optimize health and continue to treat pneumonia but stands by decision for NO dialysis and would like to have hospice support at home upon discharge from hospital. They are already connected with [facility] through their outpatient palliative program (previously followed with [facility] outpatient palliative but recently switched with knowledge that they would eventually want support from [place]).   Travis Sill, NP Palliative Medicine Team Pager [phone] (Please see [email or web address] for schedule) Team Phone [phone]

## 2020-12-12 NOTE — Progress Notes (Signed)
PROGRESS NOTE   [name]  IOX:[id number] DOB: 1927/04/06 DOA: 12/09/2020 PCP: Redmond School, MD   Chief Complaint  Patient presents with   Fever   Level of care: Med-Surg  Brief Admission History:   87 y.o. male with DM type II, HLD, PVD, s/p bilateral lower extremity BKA, chronic diastolic CHF, moderate to severe aortic stenosis, s/p NSTEMI, HTN, hypothyroidism, OSA (not on CPAP), urinary retention secondary to BPH with chronic indwelling Foley catheter.  CKD stage IV, patient with recent hospitalization due to altered mental status from ESBL UTI, he was recently discharged on 10/15/2020.  Patient was brought by his son due to decreased mental status, decreased responsiveness, symptoms has been ongoing for last 48 hours, decreased oral Intake and appetite, Foley catheter was exchanged before few days, and with multiple UTIs in the past requiring IV antibiotics, and himself cannot provide any complaints.  -In ED patient was noted altered, not communicative which is far from his baseline per son, work-up significant for sodium of 133, creatinine 3.8 around baseline, white blood cell count of 22,000, lactic acid within normal limit at 1.6, UA is positive, chest x-ray significant for right lower lobe infiltrate, [facility] consulted to admit.   Assessment & Plan:   Principal Problem:   Urinary tract infection associated with indwelling urethral catheter (HCC) Active Problems:   RLL pneumonia   Peripheral vascular disease (HCC)   Hyperlipidemia   Hypothyroidism   Chronic diastolic CHF (congestive heart failure) (HCC)   PAF (paroxysmal atrial fibrillation) (HCC)   Acquired absence of right leg below knee (HCC)   Chronic kidney disease (CKD), stage IV (severe) (HCC)   Pressure injury of skin   Acute metabolic encephalopathy   Acute UTI   Dehydration   Fever  Acute metabolic encephalopathy  - secondary to MDR UTI and pneumonia  - continue IV antibiotics and supportive  measures per family wishes - waxing/waning bouts of confusion noted. - his renal function unfortunately is not improving   RLL pneumonia  - suspect he had a single aspiration event - continue IV antibiotics thru 11/30 then will be completed.  - SLP consult appreciated to check swallow function - see SLP notes.   - dysphagia diet ordered  Acute on chronic respiratory failure - exacerbated by pneumonia and moderate pleural effusion - discussed with palliative care and with increasing work of breathing fentanyl started for symptom management - would not escalate care beyond comfort care treatments for progressive dyspnea.  - pt to complete pneumonia antibiotic treatments tomorrow 11/30.   - pt is DNR/Do not intubate.  - bronchodilators added 11/28 to treat bronchospasm.   Moderate left pleural effusion  - we have been hesitant to order thoracentesis due to patient being on plavix and apixaban - palliative consult for goals of care discussions  - given advanced age, frailty, disability, the potential benefits of a procedure like this likely do not outweigh risks - after discussion with palliative care focus now shifting to comfort approach versus acute care  Dysphagia  - appreciate SLP evaluation  - Dys 3 diet ordered  / assist with feeding  MDR UTI  - recently tested positive for ESBL E coli (likely colonized with it) - urinalysis is very abnormal and with acute symptoms will treat - continue meropenem to complete on 12/13/20.   Type 2 diabetes mellitus with vascular complications with hypoglycemia - DC SSI due  CBG (last 3)  Recent Labs    12/11/20 2124 12/12/20 0713 12/12/20 1130  GLUCAP 162* 161* 154*    Stage V CKD  -eGFR is now less than 15  - renally dose medications as able - resumed home lasix tablets twice daily  - discussed with nephrologist, it is appropriate to focus on comfort measures - pt is not a candidate for HD treatment for multiple reasons  PAF  -  resumed home apixaban  - holding carvedilol due to HR<65 and worsening CKD  Severe PVD - he remains on plavix  Dehydration  - temporarily held diuretics, but now resumed - gently hydrated with IV fluids but now discontinued  Hypothyroidism  - resumed home levothyroxine  DVT prophylaxis: apixaban  Code Status: DNR  Family Communication: sons 11/27, daughter at bedside, 11/28 and 11/29 updated   Disposition: TBD  Status is: Inpatient  Remains inpatient appropriate because: IV antibiotics required   Consultants:  SLP  Procedures:  N/a   Antimicrobials:  Meropenem 11/26>> Azithromycin 11/26>>   Subjective: Pt working more to breath and more tachypneic today. He is not eating well. No specific complaints. Difficult to understand speech today.   Objective: Vitals:   12/11/20 2125 12/12/20 0602 12/12/20 0718 12/12/20 0800  BP: (!) 142/77 (!) 145/78  (!) 154/83  Pulse: 78 74  84  Resp: _0 Temp: 98.4 F (36.9 C) 98.5 F (36.9 C)  98.5 F (36.9 C)  TempSrc:    Axillary  SpO2: 94% 94% 95% 94%  Weight:      Height:        Intake/Output Summary (Last 24 hours) at 12/12/2020 1229 Last data filed at 12/12/2020 0900 Gross per 24 hour  Intake 1440 ml  Output 500 ml  Net 940 ml   Filed Weights   12/09/20 1146  Weight: 98 kg    Examination:  General exam: elderly male with dementia, Appears acutely ill and chronically ill. Mild respiratory distress.    Respiratory system: tachypneic, bilateral expiratory wheezing heard.  Cardiovascular system: normal S1 & S2 heard. No JVD, murmurs, rubs, gallops or clicks. No pedal edema. Gastrointestinal system: Abdomen is nondistended, soft and nontender. No organomegaly or masses felt. Normal bowel sounds heard. Central nervous system: Alert and oriented to person.  No focal neurological deficits. Extremities: Symmetric 5 x 5 power. Skin: No rashes, lesions or ulcers Psychiatry: Judgement and insight appear unable to  determine. Mood & affect flat.   Data Reviewed: I have personally reviewed following labs and imaging studies  CBC: Recent Labs  Lab 12/09/20 1243 12/10/20 0242 12/11/20 0313 12/12/20 0503  WBC 22.1* 16.6* 14.6* 11.2*  NEUTROABS 18.8*  --   --   --   HGB 10.8* 9.6* 9.7* 9.7*  HCT 31.7* 29.3* 29.7* 29.7*  MCV 101.3* 103.2* 104.6* 103.5*  PLT 161 136* 151 208    Basic Metabolic Panel: Recent Labs  Lab 12/09/20 1243 12/10/20 0242 12/11/20 0313 12/12/20 0503  NA 130* 135 136 134*  K 3.8 3.7 4.2 4.4  CL 99 104 105 105  CO2 19* 20* 20* 18*  GLUCOSE 138* 86 64* 162*  BUN 58* 58* 65* 75*  CREATININE 3.86* 4.02* 4.17* 4.25*  CALCIUM 8.8* 8.5* 8.6* 8.6*  MG  --   --  2.2  --   PHOS  --   --  4.7* 5.3*    GFR: Estimated Creatinine Clearance: 13.5 mL/min (A) (by C-G formula based on SCr of 4.25 mg/dL (H)).  Liver Function Tests: Recent Labs  Lab 12/09/20 1243 12/11/20 0313 12/12/20 0503  AST 76*  --   --   ALT 20  --   --   ALKPHOS 35*  --   --   BILITOT 0.7  --   --   PROT 7.2  --   --   ALBUMIN 3.4* 3.0* 3.1*    CBG: Recent Labs  Lab 12/11/20 0847 12/11/20 1650 12/11/20 2124 12/12/20 0713 12/12/20 1130  GLUCAP 87 136* 162* 161* 154*    Recent Results (from the past 240 hour(s))  Culture, blood (Routine x 2)     Status: None (Preliminary result)   Collection Time: 12/09/20 12:00 PM   Specimen: BLOOD RIGHT FOREARM  Result Value Ref Range Status   Specimen Description   Final    BLOOD RIGHT FOREARM BOTTLES DRAWN AEROBIC AND ANAEROBIC   Special Requests Blood Culture adequate volume  Final   Culture   Final    NO GROWTH 3 DAYS Performed at [facility], [address] [place], [place] [address]    Report Status PENDING  Incomplete  Culture, blood (Routine x 2)     Status: None (Preliminary result)   Collection Time: 12/09/20 12:43 PM   Specimen: BLOOD LEFT FOREARM  Result Value Ref Range Status   Specimen Description   Final    BLOOD LEFT FOREARM  BOTTLES DRAWN AEROBIC AND ANAEROBIC   Special Requests Blood Culture adequate volume  Final   Culture   Final    NO GROWTH 3 DAYS Performed at [facility], [address] [place], [place] [address]    Report Status PENDING  Incomplete  Resp Panel by RT-PCR (Flu A&B, Covid) Nasopharyngeal Swab     Status: None   Collection Time: 12/09/20  2:16 PM   Specimen: Nasopharyngeal Swab; Nasopharyngeal(NP) swabs in vial transport medium  Result Value Ref Range Status   SARS Coronavirus 2 by RT PCR NEGATIVE NEGATIVE Final    Comment: (NOTE) SARS-CoV-2 target nucleic acids are NOT DETECTED.  The SARS-CoV-2 RNA is generally detectable in upper respiratory specimens during the acute phase of infection. The lowest concentration of SARS-CoV-2 viral copies this assay can detect is 138 copies/mL. A negative result does not preclude SARS-Cov-2 infection and should not be used as the sole basis for treatment or other patient management decisions. A negative result may occur with  improper specimen collection/handling, submission of specimen other than nasopharyngeal swab, presence of viral mutation(s) within the areas targeted by this assay, and inadequate number of viral copies(<138 copies/mL). A negative result must be combined with clinical observations, patient history, and epidemiological information. The expected result is Negative.  Fact Sheet for Patients:  EntrepreneurPulse.com.au  Fact Sheet for Healthcare Providers:  IncredibleEmployment.be  This test is no t yet approved or cleared by the [place] FDA and  has been authorized for detection and/or diagnosis of SARS-CoV-2 by FDA under an Emergency Use Authorization (EUA). This EUA will remain  in effect (meaning this test can be used) for the duration of the COVID-19 declaration under Section 564(b)(1) of the Act, 21 U.S.C.section 360bbb-3(b)(1), unless the authorization is terminated  or revoked  sooner.       Influenza A by PCR NEGATIVE NEGATIVE Final   Influenza B by PCR NEGATIVE NEGATIVE Final    Comment: (NOTE) The Xpert Xpress SARS-CoV-2/FLU/RSV plus assay is intended as an aid in the diagnosis of influenza from Nasopharyngeal swab specimens and should not be used as a sole basis for treatment. Nasal washings and aspirates are unacceptable for Xpert Xpress SARS-CoV-2/FLU/RSV  testing.  Fact Sheet for Patients: EntrepreneurPulse.com.au  Fact Sheet for Healthcare Providers: IncredibleEmployment.be  This test is not yet approved or cleared by the [place] FDA and has been authorized for detection and/or diagnosis of SARS-CoV-2 by FDA under an Emergency Use Authorization (EUA). This EUA will remain in effect (meaning this test can be used) for the duration of the COVID-19 declaration under Section 564(b)(1) of the Act, 21 U.S.C. section 360bbb-3(b)(1), unless the authorization is terminated or revoked.  Performed at [facility], [address], [place], [place] [address]   Urine Culture     Status: Abnormal (Preliminary result)   Collection Time: 12/09/20  4:22 PM   Specimen: Urine, Catheterized  Result Value Ref Range Status   Specimen Description   Final    URINE, CATHETERIZED Performed at [facility], [address] [place], [place] [address]    Special Requests   Final    NONE Performed at [facility], [address], [place], [place] [address]    Culture (A)  Final    >=100,000 COLONIES/mL KLEBSIELLA PNEUMONIAE REPEATING SENSITIVITY Performed at [facility], [place] [address] [place], [place] [address]    Report Status PENDING  Incomplete     Radiology Studies: DG CHEST PORT 1 VIEW  Result Date: 12/11/2020 CLINICAL DATA:  Aspiration pneumonia EXAM: PORTABLE CHEST 1 VIEW COMPARISON:  December 09, 2020 FINDINGS: Evaluation is limited secondary to rotation. The cardiomediastinal silhouette is unchanged in  contour. Moderate LEFT pleural effusion, unchanged. Trace fluid in the minor fissure. No pneumothorax. Unchanged homogeneous LEFT basilar opacity. Scattered RIGHT basilar heterogeneous opacities, similar in comparison to prior. Visualized abdomen is unremarkable. Multilevel degenerative changes of the thoracic spine. IMPRESSION: Similar appearance of a moderate LEFT pleural effusion with homogeneous LEFT basilar opacity and scattered RIGHT basilar opacities. Electronically Signed   By: Valentino Saxon M.D.   On: 12/11/2020 07:49    Scheduled Meds:  amLODipine  5 mg Oral Daily   apixaban  2.5 mg Oral BID   atorvastatin  20 mg Oral QPM   azithromycin  500 mg Oral Daily   Chlorhexidine Gluconate Cloth  6 each Topical Daily   cholecalciferol  5,000 Units Oral QPM   clopidogrel  75 mg Oral Daily   ferrous sulfate  325 mg Oral Q breakfast   furosemide  40 mg Oral BID   ipratropium-albuterol  3 mL Nebulization TID   isosorbide mononitrate  15 mg Oral Daily   latanoprost  1 drop Both Eyes QHS   levothyroxine  75 mcg Oral QAC breakfast   Continuous Infusions:  meropenem (MERREM) IV 500 mg (12/12/20 0639)     LOS: 3 days   Time spent: 35 mins   Deidrick Rainey Wynetta [place], MD How to contact the [facility] Attending or Consulting provider [place] or covering provider during after hours [place], for this patient?  Check the care team in [facility] and look for a) attending/consulting [facility] provider listed and b) the [facility] team listed Log into [email or web address] and use [place]'s universal password to access. If you do not have the password, please contact the hospital operator. Locate the [facility] provider you are looking for under [facility] and page to a number that you can be directly reached. If you still have difficulty reaching the provider, please page the [facility] (Director on Call) for the Hospitalists listed on amion for assistance.  12/12/2020, 12:29 PM

## 2020-12-13 DIAGNOSIS — Z89511 Acquired absence of right leg below knee: Secondary | ICD-10-CM

## 2020-12-13 DIAGNOSIS — I480 Paroxysmal atrial fibrillation: Secondary | ICD-10-CM

## 2020-12-13 LAB — RENAL FUNCTION PANEL
Albumin: 2.9 g/dL — ABNORMAL LOW (ref 3.5–5.0)
Anion gap: 11 (ref 5–15)
BUN: 80 mg/dL — ABNORMAL HIGH (ref 8–23)
CO2: 19 mmol/L — ABNORMAL LOW (ref 22–32)
Calcium: 8.4 mg/dL — ABNORMAL LOW (ref 8.9–10.3)
Chloride: 105 mmol/L (ref 98–111)
Creatinine, Ser: 4.23 mg/dL — ABNORMAL HIGH (ref 0.61–1.24)
GFR, Estimated: 13 mL/min — ABNORMAL LOW (ref >60)
Glucose, Bld: 123 mg/dL — ABNORMAL HIGH (ref 70–99)
Phosphorus: 5.2 mg/dL — ABNORMAL HIGH (ref 2.5–4.6)
Potassium: 4.3 mmol/L (ref 3.5–5.1)
Sodium: 135 mmol/L (ref 135–145)

## 2020-12-13 LAB — CBC
HCT: 30.3 % — ABNORMAL LOW (ref 39.0–52.0)
Hemoglobin: 9.6 g/dL — ABNORMAL LOW (ref 13.0–17.0)
MCH: 32.8 pg (ref 26.0–34.0)
MCHC: 31.7 g/dL (ref 30.0–36.0)
MCV: 103.4 fL — ABNORMAL HIGH (ref 80.0–100.0)
Platelets: 174 10*3/uL (ref 150–400)
RBC: 2.93 MIL/uL — ABNORMAL LOW (ref 4.22–5.81)
RDW: 14.2 % (ref 11.5–15.5)
WBC: 8.7 10*3/uL (ref 4.0–10.5)
nRBC: 0 % (ref 0.0–0.2)

## 2020-12-13 LAB — GLUCOSE, CAPILLARY
Glucose-Capillary: 119 mg/dL — ABNORMAL HIGH (ref 70–99)
Glucose-Capillary: 134 mg/dL — ABNORMAL HIGH (ref 70–99)
Glucose-Capillary: 145 mg/dL — ABNORMAL HIGH (ref 70–99)
Glucose-Capillary: 165 mg/dL — ABNORMAL HIGH (ref 70–99)

## 2020-12-13 LAB — URINE CULTURE: Culture: >=100000 — AB

## 2020-12-13 MED ORDER — IPRATROPIUM-ALBUTEROL 0.5-2.5 (3) MG/3ML IN SOLN
3.0000 mL | Freq: Two times a day (BID) | RESPIRATORY_TRACT | Status: DC
  Administered 2020-12-13 – 2020-12-14 (×2): 3 mL via RESPIRATORY_TRACT
  Filled 2020-12-13 (×2): qty 3

## 2020-12-13 MED ORDER — FENTANYL 25 MCG/HR TD PT72
1.0000 | MEDICATED_PATCH | TRANSDERMAL | Status: DC
  Administered 2020-12-13: 1 via TRANSDERMAL
  Filled 2020-12-13: qty 1

## 2020-12-13 MED ORDER — MORPHINE SULFATE (CONCENTRATE) 10 MG/0.5ML PO SOLN
2.5000 mg | ORAL | Status: DC | PRN
  Administered 2020-12-13: 2.6 mg via ORAL
  Filled 2020-12-13 (×2): qty 0.5

## 2020-12-13 MED ORDER — FENTANYL CITRATE PF 50 MCG/ML IJ SOSY
25.0000 ug | PREFILLED_SYRINGE | INTRAMUSCULAR | Status: DC | PRN
  Administered 2020-12-14 (×3): 25 ug via INTRAVENOUS
  Filled 2020-12-13 (×3): qty 1

## 2020-12-13 NOTE — Progress Notes (Signed)
Pt was very lethargic while trying to administer morning medication. Medication not administered due to lethargy. MD aware.

## 2020-12-13 NOTE — Progress Notes (Signed)
PROGRESS NOTE   DILLAN CANDELA  UEA:[id number] DOB: 1927-09-23 DOA: 12/09/2020 PCP: Redmond School, MD   Chief Complaint  Patient presents with   Fever   Level of care: Med-Surg  Brief Admission History:   87 y.o. male with DM type II, HLD, PVD, s/p bilateral lower extremity BKA, chronic diastolic CHF, moderate to severe aortic stenosis, s/p NSTEMI, HTN, hypothyroidism, OSA (not on CPAP), urinary retention secondary to BPH with chronic indwelling Foley catheter.  CKD stage IV, patient with recent hospitalization due to altered mental status from ESBL UTI, he was recently discharged on 10/15/2020.  Patient was brought by his son due to decreased mental status, decreased responsiveness, symptoms has been ongoing for last 48 hours, decreased oral Intake and appetite, Foley catheter was exchanged before few days, and with multiple UTIs in the past requiring IV antibiotics, and himself cannot provide any complaints.  -In ED patient was noted altered, not communicative which is far from his baseline per son, work-up significant for sodium of 133, creatinine 3.8 around baseline, white blood cell count of 22,000, lactic acid within normal limit at 1.6, UA is positive, chest x-ray significant for right lower lobe infiltrate, [facility] hospitalist consulted to admit.  - patient has transitioned to full comfort care at this time, awaiting discharge home with hospice on 2020-12-24  Assessment & Plan:   Principal Problem:   Urinary tract infection associated with indwelling urethral catheter ([place]) Active Problems:   Acute metabolic encephalopathy   Peripheral vascular disease ([place])   Hyperlipidemia   Hypothyroidism   Chronic diastolic CHF (congestive heart failure) (HCC)   PAF (paroxysmal atrial fibrillation) (HCC)   Acquired absence of right leg below knee (HCC)   Chronic kidney disease (CKD), stage IV (severe) (HCC)   Pressure injury of skin   Acute UTI   RLL pneumonia   Dehydration    Fever  Acute metabolic encephalopathy  - secondary to MDR UTI and pneumonia  -Treated with meropenem - waxing/waning bouts of confusion noted. - his renal function unfortunately is not improving  -Transition to comfort care on 12/13/2020  Acute on chronic respiratory failure/moderate left-sided pleural effusion/Rt LLPNA --- suspect aspiration related - exacerbated by pneumonia and moderate pleural effusion - discussed with palliative care and with increasing work of breathing fentanyl started for symptom management -Treated with meropenem through 12/13/2020 - pt is DNR/Do not intubate.  - bronchodilators added 11/28 to treat bronchospasm.  patient has transitioned to full comfort care at this time, awaiting discharge home with hospice on 2020/12/24  Dysphagia  - appreciate SLP evaluation  - Dys 3 diet ordered  / assist with feeding -Poor oral intake due to lethargy  MDR UTI  - recently tested positive for ESBL E coli (likely colonized with it) - urinalysis is very abnormal and with acute symptoms will treat - continue meropenem to complete on 12/13/20.   Type 2 diabetes mellitus with vascular complications with hypoglycemia -Comfort care  Stage V CKD  -Comfort care  PAF  Carvedilol discontinued , comfort care  Severe PVD -Plavix discontinued, comfort care  Hypothyroidism  Stop levothyroxine, comfort care  Social/ethics--- DNR/DNI, patient has transitioned to full comfort care at this time, awaiting discharge home with hospice on 2020/12/24  DVT prophylaxis: apixaban  Code Status: DNR  Family Communication:  , daughter at bedside  Disposition: Home with hospice on 2020-12-24--- patient has transitioned to full comfort care at this time, awaiting discharge home with hospice on December 24, 2020 Status is: Inpatient  Remains inpatient  appropriate because: IV antibiotics required   Consultants:  Palliative care  Procedures:  N/a   Antimicrobials:  Meropenem  11/26>> Azithromycin 11/26>>   Subjective: - Patient's daughter at bedside, palliative care provider Lattie Haw also at bedside -Patient looks tired, sleepy -Pain control remains challenging -No fevers, no vomiting  Objective: Vitals:   12/13/20 0149 12/13/20 0510 12/13/20 0706 12/13/20 1151  BP: 132/69 (!) 149/74  (!) 159/78  Pulse: 65 73  78  Resp: 20 (!) 24  20  Temp: (!) 97.5 F (36.4 C) 98.6 F (37 C)  98.4 F (36.9 C)  TempSrc:  Oral  Axillary  SpO2: 95% 92% 95% 96%  Weight:      Height:        Intake/Output Summary (Last 24 hours) at 12/13/2020 1808 Last data filed at 12/13/2020 1700 Gross per 24 hour  Intake 1040 ml  Output 350 ml  Net 690 ml   Filed Weights   12/09/20 1146  Weight: 98 kg    Examination:  Physical Exam  Gen:-Sleepy, looks tired, chronically ill HEENT:- [place].AT, No sclera icterus Neck-Supple Neck,No JVD,.  Lungs-diminished breath sounds, no wheezing  CV- S1, S2 normal, RRR Abd-  +ve B.Sounds, Abd Soft, No tenderness,    Extremity/Skin:-Bil BKA NeuroPsych-generalized weakness, lethargy, GU-Foley in situ  Data Reviewed: I have personally reviewed following labs and imaging studies  CBC: Recent Labs  Lab 12/09/20 1243 12/10/20 0242 12/11/20 0313 12/12/20 0503 12/13/20 0423  WBC 22.1* 16.6* 14.6* 11.2* 8.7  NEUTROABS 18.8*  --   --   --   --   HGB 10.8* 9.6* 9.7* 9.7* 9.6*  HCT 31.7* 29.3* 29.7* 29.7* 30.3*  MCV 101.3* 103.2* 104.6* 103.5* 103.4*  PLT 161 136* 151 170 703    Basic Metabolic Panel: Recent Labs  Lab 12/09/20 1243 12/10/20 0242 12/11/20 0313 12/12/20 0503 12/13/20 0423  NA 130* 135 136 134* 135  K 3.8 3.7 4.2 4.4 4.3  CL 99 104 105 105 105  CO2 19* 20* 20* 18* 19*  GLUCOSE 138* 86 64* 162* 123*  BUN 58* 58* 65* 75* 80*  CREATININE 3.86* 4.02* 4.17* 4.25* 4.23*  CALCIUM 8.8* 8.5* 8.6* 8.6* 8.4*  MG  --   --  2.2  --   --   PHOS  --   --  4.7* 5.3* 5.2*    GFR: Estimated Creatinine Clearance: 13.6  mL/min (A) (by C-G formula based on SCr of 4.23 mg/dL (H)).  Liver Function Tests: Recent Labs  Lab 12/09/20 1243 12/11/20 0313 12/12/20 0503 12/13/20 0423  AST 76*  --   --   --   ALT 20  --   --   --   ALKPHOS 35*  --   --   --   BILITOT 0.7  --   --   --   PROT 7.2  --   --   --   ALBUMIN 3.4* 3.0* 3.1* 2.9*    CBG: Recent Labs  Lab 12/12/20 1707 12/12/20 2031 12/13/20 0706 12/13/20 1110 12/13/20 1607  GLUCAP 142* 140* 119* 134* 145*    Recent Results (from the past 240 hour(s))  Culture, blood (Routine x 2)     Status: None (Preliminary result)   Collection Time: 12/09/20 12:00 PM   Specimen: BLOOD RIGHT FOREARM  Result Value Ref Range Status   Specimen Description   Final    BLOOD RIGHT FOREARM BOTTLES DRAWN AEROBIC AND ANAEROBIC   Special Requests Blood Culture adequate volume  Final   Culture   Final    NO GROWTH 4 DAYS Performed at [facility], [address] [place], [place] [address]    Report Status PENDING  Incomplete  Culture, blood (Routine x 2)     Status: None (Preliminary result)   Collection Time: 12/09/20 12:43 PM   Specimen: BLOOD LEFT FOREARM  Result Value Ref Range Status   Specimen Description   Final    BLOOD LEFT FOREARM BOTTLES DRAWN AEROBIC AND ANAEROBIC   Special Requests Blood Culture adequate volume  Final   Culture   Final    NO GROWTH 4 DAYS Performed at [facility], [address] [place], [place] [address]    Report Status PENDING  Incomplete  Resp Panel by RT-PCR (Flu A&B, Covid) Nasopharyngeal Swab     Status: None   Collection Time: 12/09/20  2:16 PM   Specimen: Nasopharyngeal Swab; Nasopharyngeal(NP) swabs in vial transport medium  Result Value Ref Range Status   SARS Coronavirus 2 by RT PCR NEGATIVE NEGATIVE Final    Comment: (NOTE) SARS-CoV-2 target nucleic acids are NOT DETECTED.  The SARS-CoV-2 RNA is generally detectable in upper respiratory specimens during the acute phase of infection. The  lowest concentration of SARS-CoV-2 viral copies this assay can detect is 138 copies/mL. A negative result does not preclude SARS-Cov-2 infection and should not be used as the sole basis for treatment or other patient management decisions. A negative result may occur with  improper specimen collection/handling, submission of specimen other than nasopharyngeal swab, presence of viral mutation(s) within the areas targeted by this assay, and inadequate number of viral copies(<138 copies/mL). A negative result must be combined with clinical observations, patient history, and epidemiological information. The expected result is Negative.  Fact Sheet for Patients:  EntrepreneurPulse.com.au  Fact Sheet for Healthcare Providers:  IncredibleEmployment.be  This test is no t yet approved or cleared by the [place] FDA and  has been authorized for detection and/or diagnosis of SARS-CoV-2 by FDA under an Emergency Use Authorization (EUA). This EUA will remain  in effect (meaning this test can be used) for the duration of the COVID-19 declaration under Section 564(b)(1) of the Act, 21 U.S.C.section 360bbb-3(b)(1), unless the authorization is terminated  or revoked sooner.       Influenza A by PCR NEGATIVE NEGATIVE Final   Influenza B by PCR NEGATIVE NEGATIVE Final    Comment: (NOTE) The Xpert Xpress SARS-CoV-2/FLU/RSV plus assay is intended as an aid in the diagnosis of influenza from Nasopharyngeal swab specimens and should not be used as a sole basis for treatment. Nasal washings and aspirates are unacceptable for Xpert Xpress SARS-CoV-2/FLU/RSV testing.  Fact Sheet for Patients: EntrepreneurPulse.com.au  Fact Sheet for Healthcare Providers: IncredibleEmployment.be  This test is not yet approved or cleared by the [place] FDA and has been authorized for detection and/or diagnosis of SARS-CoV-2 by FDA under  an Emergency Use Authorization (EUA). This EUA will remain in effect (meaning this test can be used) for the duration of the COVID-19 declaration under Section 564(b)(1) of the Act, 21 U.S.C. section 360bbb-3(b)(1), unless the authorization is terminated or revoked.  Performed at [facility], [address] [place], [place] [address]   Urine Culture     Status: Abnormal   Collection Time: 12/09/20  4:22 PM   Specimen: Urine, Catheterized  Result Value Ref Range Status   Specimen Description   Final    URINE, CATHETERIZED Performed at [facility], [address] [place],  [place] [address]    Special Requests   Final    NONE Performed at [facility], [address] [place], [place] [address]    Culture (A)  Final    >=100,000 COLONIES/mL KLEBSIELLA PNEUMONIAE Confirmed Extended Spectrum Beta-Lactamase Producer (ESBL).  In bloodstream infections from ESBL organisms, carbapenems are preferred over piperacillin/tazobactam. They are shown to have a lower risk of mortality. MULTI-DRUG RESISTANT ORGANISM CRITICAL RESULT CALLED TO, READ BACK BY AND VERIFIED WITH: RN [email or web address] AT 2836 ON 12/13/2020 BY T.SAAD. Performed at [facility], [place] [address] [place], [place] [address]    Report Status 12/13/2020 FINAL  Final   Organism ID, Bacteria KLEBSIELLA PNEUMONIAE (A)  Final      Susceptibility   Klebsiella pneumoniae - MIC*    AMPICILLIN >=32 RESISTANT Resistant     CEFAZOLIN >=64 RESISTANT Resistant     CEFEPIME >=32 RESISTANT Resistant     CEFTRIAXONE >=64 RESISTANT Resistant     CIPROFLOXACIN >=4 RESISTANT Resistant     GENTAMICIN >=16 RESISTANT Resistant     IMIPENEM <=0.25 SENSITIVE Sensitive     NITROFURANTOIN 256 RESISTANT Resistant     TRIMETH/SULFA >=320 RESISTANT Resistant     AMPICILLIN/SULBACTAM >=32 RESISTANT Resistant     PIP/TAZO 64 INTERMEDIATE Intermediate     * >=100,000 COLONIES/mL KLEBSIELLA PNEUMONIAE     Radiology Studies: No results  found.  Scheduled Meds:  azithromycin  500 mg Oral Daily   Chlorhexidine Gluconate Cloth  6 each Topical Daily   fentaNYL  1 patch Transdermal Q72H   furosemide  40 mg Oral BID   ipratropium-albuterol  3 mL Nebulization BID   latanoprost  1 drop Both Eyes QHS   Continuous Infusions:  meropenem (MERREM) IV 500 mg (12/13/20 0813)     LOS: 4 days   Roxan Hockey, MD How to contact the [facility] Attending or Consulting provider [place] or covering provider during after hours [place], for this patient?  Check the care team in [facility] and look for a) attending/consulting TRH provider listed and b) the [facility] team listed Log into [email or web address] and use [place]'s universal password to access. If you do not have the password, please contact the hospital operator. Locate the [facility] provider you are looking for under [facility] and page to a number that you can be directly reached. If you still have difficulty reaching the provider, please page the [facility] (Director on Call) for the Hospitalists listed on amion for assistance.  12/13/2020, 6:08 PM

## 2020-12-13 NOTE — Progress Notes (Signed)
   12/12/20 2347 12/13/20 0332  Assess: MEWS Score  Temp (!) 97.5 F (36.4 C)  --   BP (!) 148/79  --   Pulse Rate 71  --   Resp (!) 28  --   SpO2 94 %  --   O2 Device Nasal Cannula  --   O2 Flow Rate (L/min) 2 L/min  --   Assess: MEWS Score  MEWS Temp 0  --   MEWS Systolic 0  --   MEWS Pulse 0  --   MEWS RR 2  --   MEWS LOC 0  --   MEWS Score 2  --   MEWS Score Color Yellow  --   Assess: if the MEWS score is Yellow or Red  Were vital signs taken at a resting state? Yes  --   Focused Assessment Change from prior assessment (see assessment flowsheet)  --   Early Detection of Sepsis Score *See Row Information* Medium  --   MEWS guidelines implemented *See Row Information* Yes  --   Treat  MEWS Interventions Administered prn meds/treatments  --   Breathing  (tachypnea)  (tachypnea)  Negative Vocalization 0 0  Facial Expression 0 0  Body Language 0 0  Consolability 0 0  Take Vital Signs  Increase Vital Sign Frequency  Yellow: Q 2hr X 2 then Q 4hr X 2, if remains yellow, continue Q 4hrs  --   Escalate  MEWS: Escalate Yellow: discuss with charge nurse/RN and consider discussing with provider and RRT  --   Notify: Charge Nurse/RN  Name of Charge Nurse/RN Notified Lawernce Keas, RN  --   Date Charge Nurse/RN Notified 12/13/20  --   Time Charge Nurse/RN Notified 2350  --   Document  Patient Outcome Not stable and remains on department  --   Progress note created (see row info) Yes  --

## 2020-12-13 NOTE — TOC Initial Note (Signed)
Transition of Care [facility]) - Initial/Assessment Note    Patient Details  Name: [name] MRN: [id number] Date of Birth: [birth year]  Transition of Care [facility]) CM/SW Contact:    Shade Flood, LCSW Phone Number: 12/13/2020, 1:34 PM  Clinical Narrative:                  TOC notified by Palliative APNP that pt's daughter would like to take pt home with [facility] services. Updated Cassandra at [facility] and she has spoken with daughter. DME will be delivered this evening. Updated MD and pt will dc home tomorrow.  TOC will follow up in AM.  Expected Discharge Plan: Home w Hospice Care Barriers to Discharge: Equipment Delay   Patient Goals and CMS Choice Patient states their goals for this hospitalization and ongoing recovery are:: return home CMS Medicare.gov Compare Post Acute Care list provided to:: Patient Represenative (must comment) Choice offered to / list presented to : Adult Children  Expected Discharge Plan and Services Expected Discharge Plan: Home w Hospice Care In-house Referral: Clinical Social Work   Post Acute Care Choice: Hospice Living arrangements for the past 2 months: Single Family Home Expected Discharge Date: 12/13/20                                    Prior Living Arrangements/Services Living arrangements for the past 2 months: Single Family Home Lives with:: Adult Children Patient language and need for interpreter reviewed:: Yes Do you feel safe going back to the place where you live?: Yes      Need for Family Participation in Patient Care: Yes (Comment) Care giver support system in place?: Yes (comment)   Criminal Activity/Legal Involvement Pertinent to Current Situation/Hospitalization: No - Comment as needed  Activities of Daily Living Home Assistive Devices/Equipment: Prosthesis, Eyeglasses, Wheelchair, Dentures (specify type) (Bilateral legs, lift) ADL Screening (condition at time of admission) Patient's cognitive ability  adequate to safely complete daily activities?: No Is the patient deaf or have difficulty hearing?: Yes Does the patient have difficulty seeing, even when wearing glasses/contacts?: No Does the patient have difficulty concentrating, remembering, or making decisions?: Yes Patient able to express need for assistance with ADLs?: No Does the patient have difficulty dressing or bathing?: Yes Independently performs ADLs?: No Communication: Independent Dressing (OT): Needs assistance Is this a change from baseline?: Pre-admission baseline Grooming: Needs assistance Is this a change from baseline?: Pre-admission baseline Feeding: Needs assistance Is this a change from baseline?: Pre-admission baseline Bathing: Needs assistance Is this a change from baseline?: Pre-admission baseline Toileting: Needs assistance Is this a change from baseline?: Pre-admission baseline In/Out Bed: Needs assistance Is this a change from baseline?: Pre-admission baseline Does the patient have difficulty walking or climbing stairs?: Yes Weakness of Legs: Both (Bilateral BKA) Weakness of Arms/Hands: Both  Permission Sought/Granted Permission sought to share information with : Chartered certified accountant granted to share information with : Yes, Verbal Permission Granted     Permission granted to share info w AGENCY: Hospice        Emotional Assessment         Alcohol / Substance Use: Not Applicable Psych Involvement: No (comment)  Admission diagnosis:  Dehydration [E86.0] UTI (urinary tract infection) [N39.0] Encephalopathy [G93.40] Fever [R50.9] Urinary tract infection associated with indwelling urethral catheter, initial encounter ([place]) [D63.875I, N39.0] Community acquired pneumonia, unspecified laterality [J18.9] Patient Active Problem List   Diagnosis Date Noted  RLL pneumonia 12/10/2020   Dehydration 12/10/2020   Fever 12/10/2020   Urinary tract infection associated with  indwelling urethral catheter ([place]) 12/09/2020   COVID-19 virus infection 08/09/2020   Demand ischemia ([place]) 06/18/2020   Uncontrolled type 2 diabetes mellitus with hyperglycemia, without long-term current use of insulin ([place]) 02/25/2020   CKD (chronic kidney disease) stage 4, GFR 15-29 ml/min (HCC) 02/25/2020   Acute UTI    AMS (altered mental status) 02/24/2020   PNA (pneumonia) 12/06/2019   Sepsis due to PNA 30/86/5784   Acute metabolic encephalopathy 69/62/9528   AKI (acute kidney injury) [facility])    Aortic valve stenosis    Palliative care encounter    Acute kidney injury superimposed on CKD ([place]) 10/04/2019   Hypokalemia 10/04/2019   Weakness generalized 04/05/2019   S/P BKA (below knee amputation) unilateral, left ([place]) 02/15/2019   Pressure injury of skin 02/03/2019   TIA (transient ischemic attack) 02/03/2019   Transient speech disturbance 02/02/2019   Chronic indwelling Foley catheter 01/22/2019   Postoperative wound infection 01/20/2019   Normocytic anemia 01/18/2019   Cellulitis 01/18/2019   Infection of deep incisional surgical site after procedure    Abnormal urinalysis    Labile blood glucose    Urinary retention    S/P bilateral BKA (below knee amputation) ([place])    Acute blood loss anemia    Urinary retention due to benign prostatic hyperplasia 12/28/2018   Unilateral complete BKA, left, initial encounter ([place]) 12/25/2018   Dehiscence of amputation stump ([place])    History of transmetatarsal amputation of left foot ([place]) 12/09/2018   Critical limb ischemia with history of revascularization of same extremity ([place])    Gangrene of left foot ([place])    Diabetic ulcer of toe of left foot associated with type 2 diabetes mellitus ([place])    Cellulitis of left lower extremity    Toe infection 11/18/2018   Peripheral edema    Acute on chronic diastolic CHF (congestive heart failure) ([place]) 11/09/2018   S/P BKA (below knee amputation) unilateral, right ([place]) 10/05/2018    Hyponatremia    Essential hypertension    Postoperative pain    Pain of left heel    Unable to maintain weight-bearing    Type 2 diabetes mellitus with diabetic peripheral angiopathy with gangrene (HCC)    Anemia due to acute blood loss    Chronic kidney disease (CKD), stage IV (severe) (HCC)    Acquired absence of right leg below knee ([place]) 09/07/2018   Gangrene of right foot ([place])    Goals of care, counseling/discussion    Palliative care by specialist    Type 2 diabetes mellitus with vascular disease ([place])    Severe protein-calorie malnutrition ([place])    Ischemic foot 08/20/2018   Critical lower limb ischemia ([place]) 08/03/2018   Type 2 diabetes mellitus with foot ulcer, without long-term current use of insulin (HCC)    Gastroesophageal reflux disease    Cellulitis of great toe of right foot 05/29/2018   Atrial fibrillation, chronic    Chest pain 07/25/2017   PAF (paroxysmal atrial fibrillation) ([place]) 07/25/2017   BPH (benign prostatic hyperplasia) 07/25/2017   Shortness of breath 05/11/2017   Hypothyroidism 05/11/2017   Chronic diastolic CHF (congestive heart failure) ([place]) 05/11/2017   Moderate aortic stenosis 06/12/2016   RBBB 06/12/2016   Peripheral vascular disease ([place]) 08/04/2012   Essential hypertension, benign 08/04/2012   Hyperlipidemia 08/04/2012   Dizziness 08/04/2012   PCP:  Redmond School, MD Pharmacy:   [facility]  [place], [place] [address] [place] [phone] Phone: [phone] Fax: [phone]     Social Determinants of Health (SDOH) Interventions    Readmission Risk Interventions Readmission Risk Prevention Plan 12/13/2020 06/19/2020 02/25/2020  Transportation Screening Complete Complete Complete  PCP or Specialist Appt within 5-7 Days - Complete -  PCP or Specialist Appt within 3-5 Days - - -  Not Complete comments - - -  Home Care Screening - Complete -  Medication  Review (RN CM) - Complete -  HRI or [place] - - Complete  HRI or Home Care Consult comments - - -  Social Work Consult for [place] Planning/Counseling - - Complete  Palliative Care Screening - - Complete  Medication Review Press photographer) Complete - Complete  PCP or Specialist appointment within 3-5 days of discharge - - -  [place] or Home Care Consult Complete - -  SW Recovery Care/Counseling Consult Complete - -  Palliative Care Screening Complete - -  [place] Not Applicable - -  Some recent data might be hidden

## 2020-12-13 NOTE — Progress Notes (Signed)
   12/12/20 2030 12/12/20 2347  Assess: MEWS Score  Temp 98.7 F (37.1 C) (!) 97.5 F (36.4 C)  BP (!) 144/79 (!) 148/79  Pulse Rate 75 71  Resp (!) 28 (!) 28  SpO2 94 %  --   O2 Device Nasal Cannula  --   O2 Flow Rate (L/min) 2 L/min  --   Assess: MEWS Score  MEWS Temp 0 0  MEWS Systolic 0 0  MEWS Pulse 0 0  MEWS RR 2 2  MEWS LOC 0 0  MEWS Score 2 2  MEWS Score Color Yellow Yellow  Assess: if the MEWS score is Yellow or Red  Were vital signs taken at a resting state? Yes  --   Focused Assessment Change from prior assessment (see assessment flowsheet)  --   Early Detection of Sepsis Score *See Row Information* Medium  --   MEWS guidelines implemented *See Row Information* Yes  --   Treat  MEWS Interventions Administered prn meds/treatments  --   Take Vital Signs  Increase Vital Sign Frequency  Yellow: Q 2hr X 2 then Q 4hr X 2, if remains yellow, continue Q 4hrs  --   Escalate  MEWS: Escalate Yellow: discuss with charge nurse/RN and consider discussing with provider and RRT  --   Document  Patient Outcome Not stable and remains on department  --   Progress note created (see row info) Yes  --

## 2020-12-13 NOTE — Progress Notes (Signed)
Palliative:  HPI: 87 y.o. male  with past medical history of bilateral BKA, diastolic CHF, mod-severe aortic stenosis, NSTEMI, HTN, HLD, OSA, urinary retention secondary to BPH requiring indwelling foley catheter, CKD stage IV, diabetes, recent admission with AMS due to ESBL UTI admitted on 12/09/2020 with decreased mental status, decreased intake, fever and found to have UTI and pneumonia as well as worsening renal function.   I met today again with [name] at [name]' bedside. He rests during my visit. He continues with some slight shortness of breath but improved from yesterday with fentanyl. I speak more with [name] about where to go from here. We discussed hospice at home vs hospice facility and what hospice at home would look like and entail. She is leaning more towards hospice at home as she feels this will be a more relaxing environment for her father and for her family. We discussed use of liquid morphine that can be given just under the tongue or cheek to manage his symptoms. I explained that I feel that he can have good care in either location and how hospice would help at home. [name] would like to discuss with her family before making a final decision. We discussed his ongoing decline and that he is no longer benefiting from his pill medications and I would encourage him to spend his energy visiting with his family instead of trying to take a bunch of pills. [name] agrees.   Update: I returned to bedside and [name] confirms that she has spoken with her family and they agree to proceed with home with hospice support from [place]. They have a bed and he will maintain indwelling catheter. They would need oxygen and transport home. We discussed that as soon as they can get oxygen and hospice can coordinate that he would be ready for discharge. We discussed that this could be as early as today or at latest tomorrow. [name] expresses understanding.   Exam: Exam: Arousable. Less verbal today.  Confused. Shallow tachypneic breathes but improved from yesterday with medication. Abd nondistended. Warm to touch.   Plan: - Morphine/roxanol 2.5-5 mg SL PRN for pain/dyspnea.  - Recommend PRN haldol/ativan SL to be ordered at discharge to be managed at home by hospice.  - Minimized po medications - will continue Lasix if he is able to take as this could potentially be adding to his comfort as he still has urine output.  - Home with hospice support.   [place], NP Palliative Medicine Team Pager [phone] (Please see [email or web address] for schedule) Team Phone [phone]    Greater than 50%  of this time was spent counseling and coordinating care related to the above assessment and plan

## 2020-12-13 NOTE — Progress Notes (Signed)
SLP Cancellation Note  Patient Details Name: EFOSA TREICHLER MRN: [id number] DOB: 02-06-1927   Cancelled treatment:       Reason Eval/Treat Not Completed: Other (comment) (Acknowledge that Pt is going to comfort care and pleasure feeds as able. SLP will sign off.)  Thank you,  Genene Churn, [place]  [place] 12/13/2020, 1:26 PM

## 2020-12-13 NOTE — Progress Notes (Signed)
   12/12/20 2030 12/12/20 2347 12/13/20 0149  Assess: MEWS Score  Temp 98.7 F (37.1 C) (!) 97.5 F (36.4 C) (!) 97.5 F (36.4 C)  BP (!) 144/79 (!) 148/79 132/69  Pulse Rate 75 71 65  Resp (!) 28 (!) 28 20  Level of Consciousness  --   --  Alert  SpO2  --   --  95 %  O2 Device  --   --  Nasal Cannula  O2 Flow Rate (L/min)  --   --  2 L/min  Assess: MEWS Score  MEWS Temp 0 0 0  MEWS Systolic 0 0 0  MEWS Pulse 0 0 0  MEWS RR 2 2 0  MEWS LOC 0 0 0  MEWS Score 2 2 0  MEWS Score Color Yellow Yellow Green  Assess: if the MEWS score is Yellow or Red  Were vital signs taken at a resting state?  --   --  Yes  Focused Assessment  --   --  Change from prior assessment (see assessment flowsheet)  Early Detection of Sepsis Score *See Row Information*  --   --  Low  MEWS guidelines implemented *See Row Information*  --   --  Yes  Document  Patient Outcome  --   --  Stabilized after interventions  Progress note created (see row info)  --   --  Yes

## 2020-12-14 DIAGNOSIS — Z515 Encounter for palliative care: Secondary | ICD-10-CM

## 2020-12-14 LAB — CULTURE, BLOOD (ROUTINE X 2)
Culture: NO GROWTH
Culture: NO GROWTH
Special Requests: ADEQUATE
Special Requests: ADEQUATE

## 2020-12-14 LAB — GLUCOSE, CAPILLARY: Glucose-Capillary: 120 mg/dL — ABNORMAL HIGH (ref 70–99)

## 2020-12-14 MED ORDER — MORPHINE SULFATE (CONCENTRATE) 10 MG/0.5ML PO SOLN: 5.0000 mg | ORAL | 0 refills | Status: AC | PRN

## 2020-12-14 MED ORDER — FENTANYL 25 MCG/HR TD PT72: 1.0000 | MEDICATED_PATCH | TRANSDERMAL | 0 refills | Status: AC

## 2020-12-14 MED ORDER — MORPHINE SULFATE (CONCENTRATE) 10 MG/0.5ML PO SOLN
5.0000 mg | ORAL | Status: DC | PRN
Start: 2020-12-14 — End: 2020-12-14

## 2021-01-14 NOTE — Discharge Summary (Addendum)
[name], is a 88 y.o. male  DOB [birth year]  MRN [id number].  Admission date:  12/09/2020  Admitting Physician  Albertine Patricia, MD  Discharge Date:  01-07-21   Primary MD  Redmond School, MD  Recommendations for primary care physician for things to follow:   1)Discharge home with full comfort care and hospice care 2)Please contact your hospice team including hospice RN-Nurse if any questions arises - patient has transitioned to full comfort care at this time,  discharge home with hospice on 07-Jan-2021  Admission Diagnosis  Dehydration [E86.0] UTI (urinary tract infection) [N39.0] Encephalopathy [G93.40] Fever [R50.9] Urinary tract infection associated with indwelling urethral catheter, initial encounter ([place]) [M27.078M, N39.0] Community acquired pneumonia, unspecified laterality [J18.9]   Discharge Diagnosis  Dehydration [E86.0] UTI (urinary tract infection) [N39.0] Encephalopathy [G93.40] Fever [R50.9] Urinary tract infection associated with indwelling urethral catheter, initial encounter ([place]) [L54.492E, N39.0] Community acquired pneumonia, unspecified laterality [J18.9]    Principal Problem:   End of life care Active Problems:   Palliative care encounter   Acute metabolic encephalopathy   Peripheral vascular disease ([place])   Hyperlipidemia   Hypothyroidism   Chronic diastolic CHF (congestive heart failure) (HCC)   PAF (paroxysmal atrial fibrillation) ([place])   Acquired absence of right leg below knee ([place])   Chronic kidney disease (CKD), stage IV (severe) ([place])   Pressure injury of skin   Acute UTI   Urinary tract infection associated with indwelling urethral catheter ([place])   RLL pneumonia   Dehydration   Fever      Past Medical History:  Diagnosis Date   Arthritis    Borderline diabetic    CHF (congestive heart failure) ([place])    Chronic kidney disease    Diabetes  mellitus without complication ([place])    Dysrhythmia    Glaucoma    Hyperlipidemia    Hypertension    Hypothyroidism    Myocardial infarction ([place])    PVD (peripheral vascular disease) ([place])    Sleep apnea    Cpap ordered but doesnt use   Urinary retention due to benign prostatic hyperplasia 12/28/2018    Past Surgical History:  Procedure Laterality Date   ABDOMINAL AORTOGRAM W/LOWER EXTREMITY N/A 07/27/2018   Procedure: ABDOMINAL AORTOGRAM W/LOWER EXTREMITY;  Surgeon: Lorretta Harp, MD;  Location: [facility];  Service: Cardiovascular;  Laterality: N/A;   AMPUTATION Right 08/28/2018   Procedure: RIGHT BELOW KNEE AMPUTATION;  Surgeon: [name], MD;  Location: [facility];  Service: Orthopedics;  Laterality: Right;   AMPUTATION Left 12/02/2018   Procedure: LEFT TRANSMETATARSAL AMPUTATION AND NEGATIVE PRESSURE WOUND VAC PLACEMENT;  Surgeon: [name], MD;  Location: [facility];  Service: Orthopedics;  Laterality: Left;   AMPUTATION Left 12/18/2018   Procedure: LEFT BELOW KNEE AMPUTATION;  Surgeon: [name], MD;  Location: [facility];  Service: Orthopedics;  Laterality: Left;   APPENDECTOMY  1943   BELOW KNEE LEG AMPUTATION Left 12/18/2018   CATARACT EXTRACTION  2012   x2   COLONOSCOPY  N/A 11/01/2014   Procedure: COLONOSCOPY;  Surgeon: Aviva Signs, MD;  [facility];  Service: Gastroenterology;  Laterality: N/A;   ESOPHAGOGASTRODUODENOSCOPY N/A 11/01/2014   Procedure: ESOPHAGOGASTRODUODENOSCOPY (EGD);  Surgeon: Aviva Signs, MD;  [facility];  Service: Gastroenterology;  Laterality: N/A;   HERNIA REPAIR Left    INGUINAL HERNIA REPAIR Right 03/01/2013   Procedure: RIGHT INGUINAL HERNIORRHAPHY;  Surgeon: Jamesetta So, MD;  Location: [facility];  Service: General;  Laterality: Right;   INSERTION OF MESH Right 03/01/2013   Procedure: INSERTION OF MESH;  Surgeon: Jamesetta So, MD;  Location: [facility];  Service: General;  Laterality: Right;   LOWER EXTREMITY  ANGIOGRAPHY Right 08/25/2018   Procedure: LOWER EXTREMITY ANGIOGRAPHY;  Surgeon: Wellington Hampshire, MD;  Location: [facility];  Service: Cardiovascular;  Laterality: Right;   LOWER EXTREMITY ANGIOGRAPHY N/A 11/25/2018   Procedure: LOWER EXTREMITY ANGIOGRAPHY;  Surgeon: Wellington Hampshire, MD;  Location: [facility];  Service: Cardiovascular;  Laterality: N/A;   NM MYOCAR PERF WALL MOTION  06/05/2010   no significant ischemia   PERIPHERAL VASCULAR ATHERECTOMY Right 08/03/2018   Procedure: PERIPHERAL VASCULAR ATHERECTOMY;  Surgeon: Lorretta Harp, MD;  Location: [facility];  Service: Cardiovascular;  Laterality: Right;   PERIPHERAL VASCULAR ATHERECTOMY Left 11/23/2018   Procedure: PERIPHERAL VASCULAR ATHERECTOMY;  Surgeon: Lorretta Harp, MD;  Location: [facility];  Service: Cardiovascular;  Laterality: Left;  sfa with dc balloon   PERIPHERAL VASCULAR BALLOON ANGIOPLASTY Left 11/25/2018   Procedure: PERIPHERAL VASCULAR BALLOON ANGIOPLASTY;  Surgeon: Wellington Hampshire, MD;  Location: [facility];  Service: Cardiovascular;  Laterality: Left;  popliteal   PERIPHERAL VASCULAR INTERVENTION Left 11/25/2018   Procedure: PERIPHERAL VASCULAR INTERVENTION;  Surgeon: Wellington Hampshire, MD;  Location: [facility];  Service: Cardiovascular;  Laterality: Left;  tibial peroneal trunk and peroneal stents    stent  06/14/2010   left leg   STUMP REVISION Left 01/20/2019   Procedure: REVISION LEFT BELOW KNEE AMPUTATION;  Surgeon: [name], MD;  Location: [facility];  Service: Orthopedics;  Laterality: Left;   US ECHOCARDIOGRAPHY  01/21/2006   moderate mitral annular ca+, mild MR, AOV moderately sclerotic.     HPI  from the history and physical done on the day of admission:   [name]  is a 88 y.o. male,  WM PMHx DM type II uncontrolled with complication, HLD, s/p bilateral lower extremity BKA, chronic diastolic CHF, moderate to severe aortic stenosis, s/p NSTEMI,  HTN, hypothyroidism, OSA (not on CPAP), urinary retention secondary to BPH indwelling Foley catheter.  CKD stage IV, patient with recent hospitalization due to altered mental status from ESBL UTI, he is discharged on 10/15/2020. -Patient was brought by his son due to decreased mental status, decreased responsiveness, symptoms has been ongoing for last 48 hours, decreased oral Intake and appetite, Foley catheter was exchanged before few days, and with multiple UTIs in the past requiring IV antibiotics, and himself cannot provide any complaints. -In ED patient was noted altered, not communicative which is far from his baseline per son, work-up significant for sodium of 133, creatinine 3.8 around baseline, white blood cell count of 22,000, lactic acid within normal limit at 1.6, UA is positive, chest x-ray significant for right lower lobe infiltrate, [facility]ist consulted to admit     Hospital Course:    Brief Admission History:   88 y.o. male with DM type II, HLD,  PVD, s/p bilateral lower extremity BKA, chronic diastolic CHF, moderate to severe aortic stenosis, s/p NSTEMI, HTN, hypothyroidism, OSA (not on CPAP), urinary retention secondary to BPH with chronic indwelling Foley catheter.  CKD stage IV, patient with recent hospitalization due to altered mental status from ESBL UTI, he was recently discharged on 10/15/2020.  Patient was brought by his son due to decreased mental status, decreased responsiveness, symptoms has been ongoing for last 48 hours, decreased oral Intake and appetite, Foley catheter was exchanged before few days, and with multiple UTIs in the past requiring IV antibiotics, and himself cannot provide any complaints.  -In ED patient was noted altered, not communicative which is far from his baseline per son, work-up significant for sodium of 133, creatinine 3.8 around baseline, white blood cell count of 22,000, lactic acid within normal limit at 1.6, UA is positive, chest x-ray  significant for right lower lobe infiltrate, [facility] hospitalist consulted to admit.  - patient has transitioned to full comfort care at this time,  --discharge home with hospice on 12/23/2020   Assessment & Plan:   Principal Problem:   Urinary tract infection associated with indwelling urethral catheter ([place]) Active Problems:   Acute metabolic encephalopathy   Peripheral vascular disease ([place])   Hyperlipidemia   Hypothyroidism   Chronic diastolic CHF (congestive heart failure) (HCC)   PAF (paroxysmal atrial fibrillation) (HCC)   Acquired absence of right leg below knee (HCC)   Chronic kidney disease (CKD), stage IV (severe) (HCC)   Pressure injury of skin   Acute UTI   RLL pneumonia   Dehydration   Fever   Acute metabolic encephalopathy  - secondary to MDR UTI and pneumonia  -Treated with meropenem - waxing/waning bouts of confusion noted. - his renal function unfortunately is not improving  -Transitioned to comfort care on 12/13/2020   Acute on chronic respiratory failure/moderate left-sided pleural effusion/Rt LLPNA --- suspect aspiration related - exacerbated by pneumonia and moderate pleural effusion - discussed with palliative care and with increasing work of breathing fentanyl started for symptom management -Treated with meropenem through 12/13/2020 - pt is DNR/Do not intubate.  - bronchodilators added 11/28 to treat bronchospasm.  patient has transitioned to full comfort care at this time, discharge home with hospice on 12/23/2020   Dysphagia  - appreciate SLP evaluation  - Dys 3 diet ordered  / assist with feeding -Poor oral intake due to lethargy   MDR UTI  - recently tested positive for ESBL E coli (likely colonized with it) - urinalysis is very abnormal and with acute symptoms will treat - continue meropenem to complete on 12/13/20.    Type 2 diabetes mellitus with vascular complications with hypoglycemia -Comfort care   Stage V CKD  -Comfort care   PAF   Carvedilol discontinued , comfort care   Severe PVD -Plavix discontinued, comfort care   Hypothyroidism  Stop levothyroxine, comfort care   Social/ethics--- DNR/DNI, patient has transitioned to full comfort care at this time,  discharge home with hospice on 12/23/20  Code Status: DNR  Family Communication:  , daughter at bedside  Disposition: Home with hospice on 12/23/20--- patient has transitioned to full comfort care at this time,  discharge home with hospice on 2020/12/23 Consultants:  Palliative care   Procedures:  N/a    Antimicrobials:  Meropenem  Azithromycin     Objective:       Vitals:    12/13/20 0149       Discharge Condition: Overall prognosis is grave  Follow UP--- hospice team   Consults obtained -palliative care  Diet and Activity recommendation:  As advised  Discharge Instructions    Discharge Instructions     Call MD for:  difficulty breathing, headache or visual disturbances   Complete by: As directed    Call MD for:  persistant dizziness or light-headedness   Complete by: As directed    Call MD for:  persistant nausea and vomiting   Complete by: As directed    Call MD for:  severe uncontrolled pain   Complete by: As directed    Call MD for:  temperature >100.4   Complete by: As directed    Diet - low sodium heart healthy   Complete by: As directed    Discharge instructions   Complete by: As directed    1)Discharge home with full comfort care and hospice care 2)Please contact your hospice team including hospice RN-Nurse if any questions arises   Discharge wound care:   Complete by: As directed    As advised   Increase activity slowly   Complete by: As directed        Discharge Medications     Allergies as of 2020-12-25       Reactions   Iodinated Diagnostic Agents Other (See Comments)   caused Fever   Dye Fdc Red [red Dye] Hives   Tolerated red Docusate, Niferex   Hydralazine         Medication List     STOP  taking these medications    acetaminophen 500 MG tablet Commonly known as: TYLENOL   amLODipine 5 MG tablet Commonly known as: NORVASC   atorvastatin 40 MG tablet Commonly known as: LIPITOR   betamethasone dipropionate 0.05 % cream   carvedilol 3.125 MG tablet Commonly known as: COREG   clopidogrel 75 MG tablet Commonly known as: PLAVIX   co-enzyme Q-10 30 MG capsule   doxazosin 2 MG tablet Commonly known as: CARDURA   Eliquis 2.5 MG Tabs tablet Generic drug: apixaban   famotidine 20 MG tablet Commonly known as: PEPCID   ferrous sulfate 325 (65 FE) MG tablet   furosemide 40 MG tablet Commonly known as: Lasix   glipiZIDE 10 MG tablet Commonly known as: GLUCOTROL   isosorbide mononitrate 30 MG 24 hr tablet Commonly known as: IMDUR   latanoprost 0.005 % ophthalmic solution Commonly known as: XALATAN   levothyroxine 75 MCG tablet Commonly known as: SYNTHROID   nitroGLYCERIN 0.4 MG SL tablet Commonly known as: NITROSTAT   OneTouch Ultra test strip Generic drug: glucose blood   polyethylene glycol 17 g packet Commonly known as: MiraLax   potassium chloride SA 20 MEQ tablet Commonly known as: KLOR-CON M   pyridoxine 100 MG tablet Commonly known as: B-6   traMADol 50 MG tablet Commonly known as: Ultram   vitamin C 1000 MG tablet   Vitamin D-3 125 MCG (5000 UT) Tabs   vitamin E 180 MG (400 UNITS) capsule   zinc gluconate 50 MG tablet       TAKE these medications    fentaNYL 25 MCG/HR Commonly known as: DURAGESIC Place 1 patch onto the skin every 3 (three) days. Start taking on: December 16, 2020   morphine CONCENTRATE 10 MG/0.5ML Soln concentrated solution Place 0.25-0.5 mLs (5-10 mg total) under the tongue every hour as needed for severe pain.               Discharge Care Instructions  (From admission, onward)  Start     Ordered   01-10-21 0000  Discharge wound care:       Comments: As advised   January 10, 2021 1100             Major procedures and Radiology Reports - PLEASE review detailed and final reports for all details, in brief -    DG Chest 1 View  Result Date: 12/09/2020 CLINICAL DATA:  Fever, suspected sepsis. History of multiple UTIs with recent Foley catheter change. EXAM: CHEST  1 VIEW COMPARISON:  Chest radiograph 10/11/2020 FINDINGS: Persistent low lung volumes. Unchanged confluent opacity at the left lung base compared to 10/11/2020 with layering pleural fluid. New small patchy airspace opacity in the medial right lower lobe. No right pleural effusion. No pneumothorax. Stable cardiomegaly. No acute osseous abnormality. IMPRESSION: 1. New small patchy airspace opacity in the medial right lower lobe could represent infection versus aspiration related changes. 2. Unchanged confluent airspace opacity in the left lower lobe, favored to represent moderate effusion with left lower lobe atelectasis, though infection is not excluded. 3. Stable cardiomegaly. Electronically Signed   By: Ileana Roup M.D.   On: 12/09/2020 12:29   DG CHEST PORT 1 VIEW  Result Date: 12/11/2020 CLINICAL DATA:  Aspiration pneumonia EXAM: PORTABLE CHEST 1 VIEW COMPARISON:  December 09, 2020 FINDINGS: Evaluation is limited secondary to rotation. The cardiomediastinal silhouette is unchanged in contour. Moderate LEFT pleural effusion, unchanged. Trace fluid in the minor fissure. No pneumothorax. Unchanged homogeneous LEFT basilar opacity. Scattered RIGHT basilar heterogeneous opacities, similar in comparison to prior. Visualized abdomen is unremarkable. Multilevel degenerative changes of the thoracic spine. IMPRESSION: Similar appearance of a moderate LEFT pleural effusion with homogeneous LEFT basilar opacity and scattered RIGHT basilar opacities. Electronically Signed   By: Valentino Saxon M.D.   On: 12/11/2020 07:49    Micro Results   Recent Results (from the past 240 hour(s))  Culture, blood (Routine x 2)     Status: None    Collection Time: 12/09/20 12:00 PM   Specimen: BLOOD RIGHT FOREARM  Result Value Ref Range Status   Specimen Description   Final    BLOOD RIGHT FOREARM BOTTLES DRAWN AEROBIC AND ANAEROBIC   Special Requests Blood Culture adequate volume  Final   Culture   Final    NO GROWTH 5 DAYS Performed at [facility], [address], [place], [place] [address]    Report Status Jan 10, 2021 FINAL  Final  Culture, blood (Routine x 2)     Status: None   Collection Time: 12/09/20 12:43 PM   Specimen: BLOOD LEFT FOREARM  Result Value Ref Range Status   Specimen Description   Final    BLOOD LEFT FOREARM BOTTLES DRAWN AEROBIC AND ANAEROBIC   Special Requests Blood Culture adequate volume  Final   Culture   Final    NO GROWTH 5 DAYS Performed at [facility], [address], [place], [place] [address]    Report Status January 10, 2021 FINAL  Final  Resp Panel by RT-PCR (Flu A&B, Covid) Nasopharyngeal Swab     Status: None   Collection Time: 12/09/20  2:16 PM   Specimen: Nasopharyngeal Swab; Nasopharyngeal(NP) swabs in vial transport medium  Result Value Ref Range Status   SARS Coronavirus 2 by RT PCR NEGATIVE NEGATIVE Final    Comment: (NOTE) SARS-CoV-2 target nucleic acids are NOT DETECTED.  The SARS-CoV-2 RNA is generally detectable in upper respiratory specimens during the acute phase of infection. The lowest concentration of SARS-CoV-2 viral copies this assay can  detect is 138 copies/mL. A negative result does not preclude SARS-Cov-2 infection and should not be used as the sole basis for treatment or other patient management decisions. A negative result may occur with  improper specimen collection/handling, submission of specimen other than nasopharyngeal swab, presence of viral mutation(s) within the areas targeted by this assay, and inadequate number of viral copies(<138 copies/mL). A negative result must be combined with clinical observations, patient history, and epidemiological information.  The expected result is Negative.  Fact Sheet for Patients:  EntrepreneurPulse.com.au  Fact Sheet for Healthcare Providers:  IncredibleEmployment.be  This test is no t yet approved or cleared by the [place] FDA and  has been authorized for detection and/or diagnosis of SARS-CoV-2 by FDA under an Emergency Use Authorization (EUA). This EUA will remain  in effect (meaning this test can be used) for the duration of the COVID-19 declaration under Section 564(b)(1) of the Act, 21 U.S.C.section 360bbb-3(b)(1), unless the authorization is terminated  or revoked sooner.       Influenza A by PCR NEGATIVE NEGATIVE Final   Influenza B by PCR NEGATIVE NEGATIVE Final    Comment: (NOTE) The Xpert Xpress SARS-CoV-2/FLU/RSV plus assay is intended as an aid in the diagnosis of influenza from Nasopharyngeal swab specimens and should not be used as a sole basis for treatment. Nasal washings and aspirates are unacceptable for Xpert Xpress SARS-CoV-2/FLU/RSV testing.  Fact Sheet for Patients: EntrepreneurPulse.com.au  Fact Sheet for Healthcare Providers: IncredibleEmployment.be  This test is not yet approved or cleared by the [place] FDA and has been authorized for detection and/or diagnosis of SARS-CoV-2 by FDA under an Emergency Use Authorization (EUA). This EUA will remain in effect (meaning this test can be used) for the duration of the COVID-19 declaration under Section 564(b)(1) of the Act, 21 U.S.C. section 360bbb-3(b)(1), unless the authorization is terminated or revoked.  Performed at [facility], [address] [place], [place] [address]   Urine Culture     Status: Abnormal   Collection Time: 12/09/20  4:22 PM   Specimen: Urine, Catheterized  Result Value Ref Range Status   Specimen Description   Final    URINE, CATHETERIZED Performed at [facility], [address] [place], [place] [address]     Special Requests   Final    NONE Performed at [facility], [address], [place],  [address]    Culture (A)  Final    >=100,000 COLONIES/mL KLEBSIELLA PNEUMONIAE Confirmed Extended Spectrum Beta-Lactamase Producer (ESBL).  In bloodstream infections from ESBL organisms, carbapenems are preferred over piperacillin/tazobactam. They are shown to have a lower risk of mortality. MULTI-DRUG RESISTANT ORGANISM CRITICAL RESULT CALLED TO, READ BACK BY AND VERIFIED WITH: RN [email or web address] AT 2025 ON 12/13/2020 BY T.SAAD. Performed at [facility], [place] [address] [place],  [address]    Report Status 12/13/2020 FINAL  Final   Organism ID, Bacteria KLEBSIELLA PNEUMONIAE (A)  Final      Susceptibility   Klebsiella pneumoniae - MIC*    AMPICILLIN >=32 RESISTANT Resistant     CEFAZOLIN >=64 RESISTANT Resistant     CEFEPIME >=32 RESISTANT Resistant     CEFTRIAXONE >=64 RESISTANT Resistant     CIPROFLOXACIN >=4 RESISTANT Resistant     GENTAMICIN >=16 RESISTANT Resistant     IMIPENEM <=0.25 SENSITIVE Sensitive     NITROFURANTOIN 256 RESISTANT Resistant     TRIMETH/SULFA >=320 RESISTANT Resistant     AMPICILLIN/SULBACTAM >=32 RESISTANT Resistant     PIP/TAZO 64  INTERMEDIATE Intermediate     * >=100,000 COLONIES/mL KLEBSIELLA PNEUMONIAE    Today   Subjective    [name] today appears comfortable, lethargic -No fevers, no emesis    patient has transitioned to full comfort care at this time,  discharge home with hospice on 01/03/21  Patient has been seen and examined prior to discharge   Objective   Blood pressure 118/85, pulse (!) 114, temperature 98.5 F (36.9 C), temperature source Oral, resp. rate (!) 26, height 5\' 11"  (1.803 m), weight 98 kg, SpO2 96 %.   Intake/Output Summary (Last 24 hours) at 2021/01/03 1113 Last data filed at 01/03/21 0900 Gross per 24 hour  Intake 1420 ml  Output 550 ml  Net 870 ml   Exam   Gen:-Sleepy, looks tired, chronically  ill HEENT:- .AT, No sclera icterus Neck-Supple Neck,No JVD,.  Lungs-diminished breath sounds, no wheezing  CV- S1, S2 normal, RRR Abd-  +ve B.Sounds, Abd Soft, No tenderness,    Extremity/Skin:-Bil BKA NeuroPsych-generalized weakness, lethargy, GU-Foley in situ   Data Review   CBC w Diff:  Lab Results  Component Value Date   WBC 8.7 12/13/2020   HGB 9.6 (L) 12/13/2020   HGB 10.4 (L) 07/23/2018   HCT 30.3 (L) 12/13/2020   HCT 30.4 (L) 07/23/2018   PLT 174 12/13/2020   PLT 255 07/23/2018   LYMPHOPCT 9 12/09/2020   MONOPCT 5 12/09/2020   EOSPCT 0 12/09/2020   BASOPCT 0 12/09/2020    CMP:  Lab Results  Component Value Date   NA 135 12/13/2020   NA 138 07/19/2020   K 4.3 12/13/2020   CL 105 12/13/2020   CO2 19 (L) 12/13/2020   BUN 80 (H) 12/13/2020   BUN 38 (H) 07/19/2020   CREATININE 4.23 (H) 12/13/2020   PROT 7.2 12/09/2020   PROT 7.2 05/09/2017   ALBUMIN 2.9 (L) 12/13/2020   ALBUMIN 4.6 05/09/2017   BILITOT 0.7 12/09/2020   BILITOT 0.6 05/09/2017   ALKPHOS 35 (L) 12/09/2020   AST 76 (H) 12/09/2020   ALT 20 12/09/2020  .   Total Discharge time is about 33 minutes  Roxan Hockey M.D on 01-03-21 at 11:13 AM  Go to [email or web address] -  for contact info  [facility] - Office  [phone]

## 2021-01-14 NOTE — TOC Transition Note (Signed)
Transition of Care [facility]) - CM/SW Discharge Note   Patient Details  Name: [name] MRN: [id number] Date of Birth: 1927-01-31  Transition of Care [facility]) CM/SW Contact:  Shade Flood, LCSW Phone Number: 01-05-21, 11:19 AM   Clinical Narrative:     Pt stable for dc home with hospice today per MD. Spoke with pt's daughter who states that the O2 has been delivered and they are ready for pt to come home. Updated [facility] at Hospice. EMS will transport. RN aware. There are no other TOC needs for dc.  Final next level of care: Home w Hospice Care Barriers to Discharge: Barriers Resolved   Patient Goals and CMS Choice Patient states their goals for this hospitalization and ongoing recovery are:: return home CMS Medicare.gov Compare Post Acute Care list provided to:: Patient Represenative (must comment) Choice offered to / list presented to : Adult Children  Discharge Placement                       Discharge Plan and Services In-house Referral: Clinical Social Work   Post Acute Care Choice: Hospice                               Social Determinants of Health (SDOH) Interventions     Readmission Risk Interventions Readmission Risk Prevention Plan 12/13/2020 06/19/2020 02/25/2020  Transportation Screening Complete Complete Complete  PCP or Specialist Appt within 5-7 Days - Complete -  PCP or Specialist Appt within 3-5 Days - - -  Not Complete comments - - -  Home Care Screening - Complete -  Medication Review (RN CM) - Complete -  HRI or [place] - - Complete  HRI or Home Care Consult comments - - -  Social Work Consult for [place] Planning/Counseling - - Complete  Palliative Care Screening - - Complete  Medication Review Press photographer) Complete - Complete  PCP or Specialist appointment within 3-5 days of discharge - - -  [place] or Home Care Consult Complete - -  SW Recovery Care/Counseling Consult Complete - -  Palliative Care Screening  Complete - -  [place] Not Applicable - -  Some recent data might be hidden

## 2021-01-14 NOTE — Progress Notes (Signed)
   12/13/20 2338  Assess: MEWS Score  Temp 98.3 F (36.8 C)  BP 122/90  Pulse Rate (!) 130  Resp (!) 22  Level of Consciousness Alert  SpO2 97 %  O2 Device Nasal Cannula  Patient Activity (if Appropriate) In bed  O2 Flow Rate (L/min) 2 L/min  Assess: MEWS Score  MEWS Temp 0  MEWS Systolic 0  MEWS Pulse 3  MEWS RR 1  MEWS LOC 0  MEWS Score 4  MEWS Score Color Red  Charge nurse and hospitalist notified.

## 2021-01-14 NOTE — Progress Notes (Signed)
Palliative:  HPI: 88 y.o. male  with past medical history of bilateral BKA, diastolic CHF, mod-severe aortic stenosis, NSTEMI, HTN, HLD, OSA, urinary retention secondary to BPH requiring indwelling foley catheter, CKD stage IV, diabetes, recent admission with AMS due to ESBL UTI admitted on 12/09/2020 with decreased mental status, decreased intake, fever and found to have UTI and pneumonia as well as worsening renal function.   I met today at [name]' bedside but no family present. He is resting comfortably but warm to touch. Worry he could be febrile. Breathing regular and continues with some wheezing but not labored. Discussed with RN to provide with pain medication prior to transport. He continues to progress at end of life.   I called and spoke with [name]. We discussed symptom management and she shares that the roxanol did not help but in looking back he received the smallest dose and this will still be available from hospice to provide relief for breakthrough symptoms but may just need higher dose. He was placed on fentanyl patch and this will go home with him - [name] asks if this will be continued at home and renewed by hospice but I encouraged her to discuss this with hospice nurse to be sure. We discussed his declining mentation and unlikely to take medications or be able to eat/drink. Discussed that this is expectation at end of life and that swabbing mouth or dripping few drops of water from straw to provide moisture will provide him with comfort. No needs to try and take pills as these are unlikely to add to quantity or quality of life. [name] expresses understanding.   Oxygen was being delivered as I was on the phone with [name].   All questions/concerns addressed. Emotional support provided.   Exam: Minimally responsive - opens eyes briefly. Breathing shallow but regular, exp wheezing audible at bedside. Abd soft. Warm to touch - febrile?  Plan: - Home with hospice support.  -  Morphine/roxanol 5-10 mg SL PRN for pain/dyspnea.  - Recommend PRN haldol/ativan SL to be ordered at discharge to be managed at home by hospice.   25 min  Vinie Sill, NP Palliative Medicine Team Pager [phone] (Please see [email or web address] for schedule) Team Phone [phone]    Greater than 50%  of this time was spent counseling and coordinating care related to the above assessment and plan

## 2021-01-14 NOTE — Progress Notes (Signed)
Spoke with pt daughter, [name], about pt AVS. No questions or concerns at this time. Pt transported via EMS to home.

## 2021-01-14 NOTE — Discharge Instructions (Signed)
1)Discharge home with full comfort care and hospice care 2)Please contact your hospice team including hospice RN-Nurse if any questions arises

## 2021-01-14 DEATH — deceased

## 2021-06-10 IMAGING — DX DG HIP (WITH OR WITHOUT PELVIS) 2-3V RIGHT
3 series · 3 of 3 positions shown · non-contrast
Comparison: None.

CLINICAL DATA: Right-sided groin pain worsening for a few days,
atraumatic.

EXAM:
DG HIP (WITH OR WITHOUT PELVIS) 2-3V RIGHT

[pelvis ap]
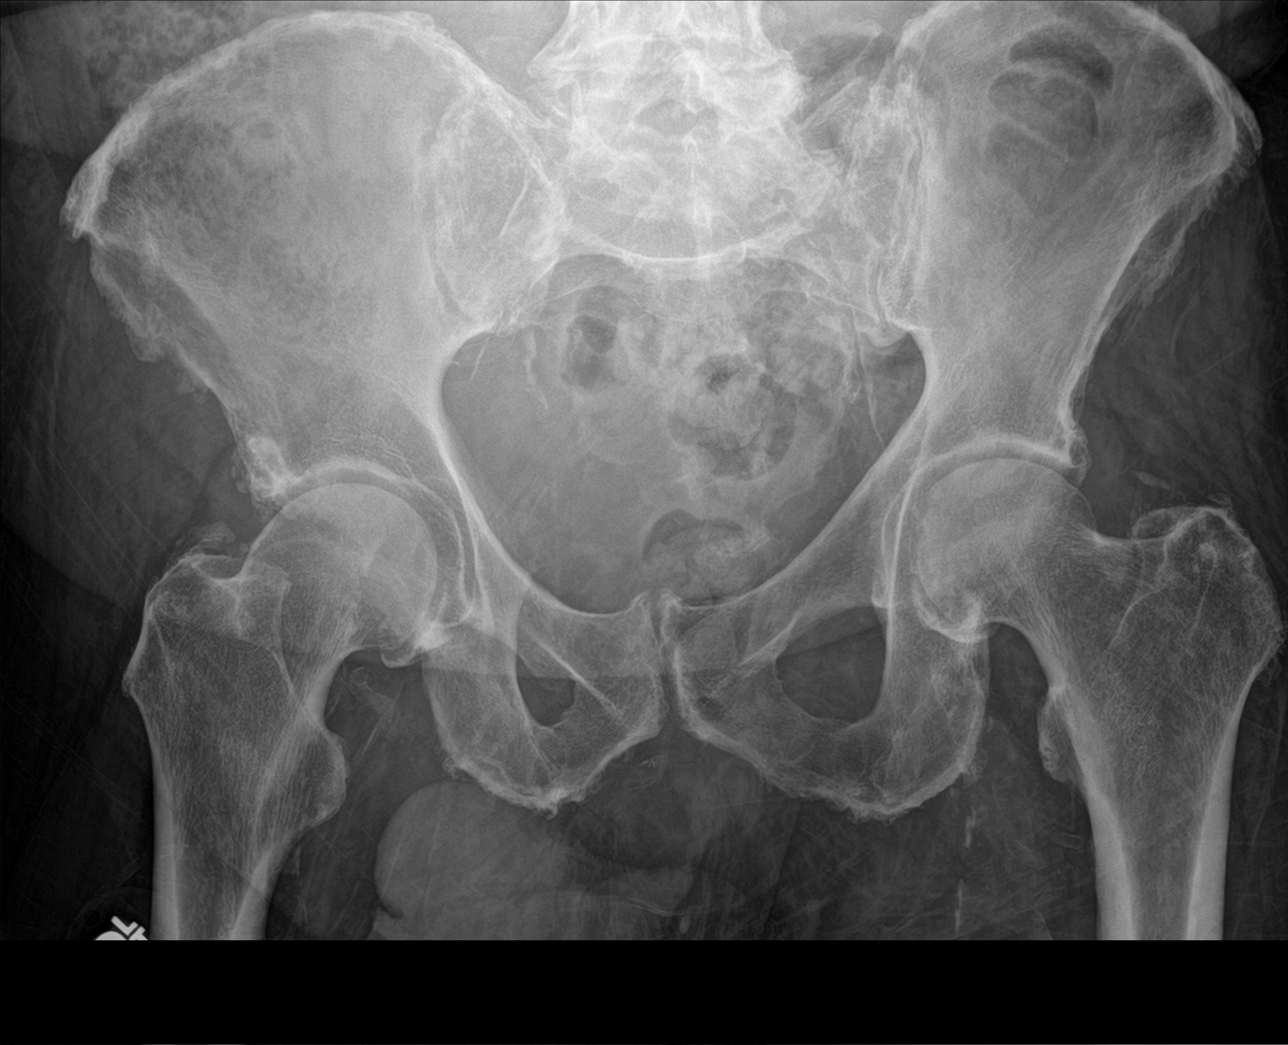

[hip ap]
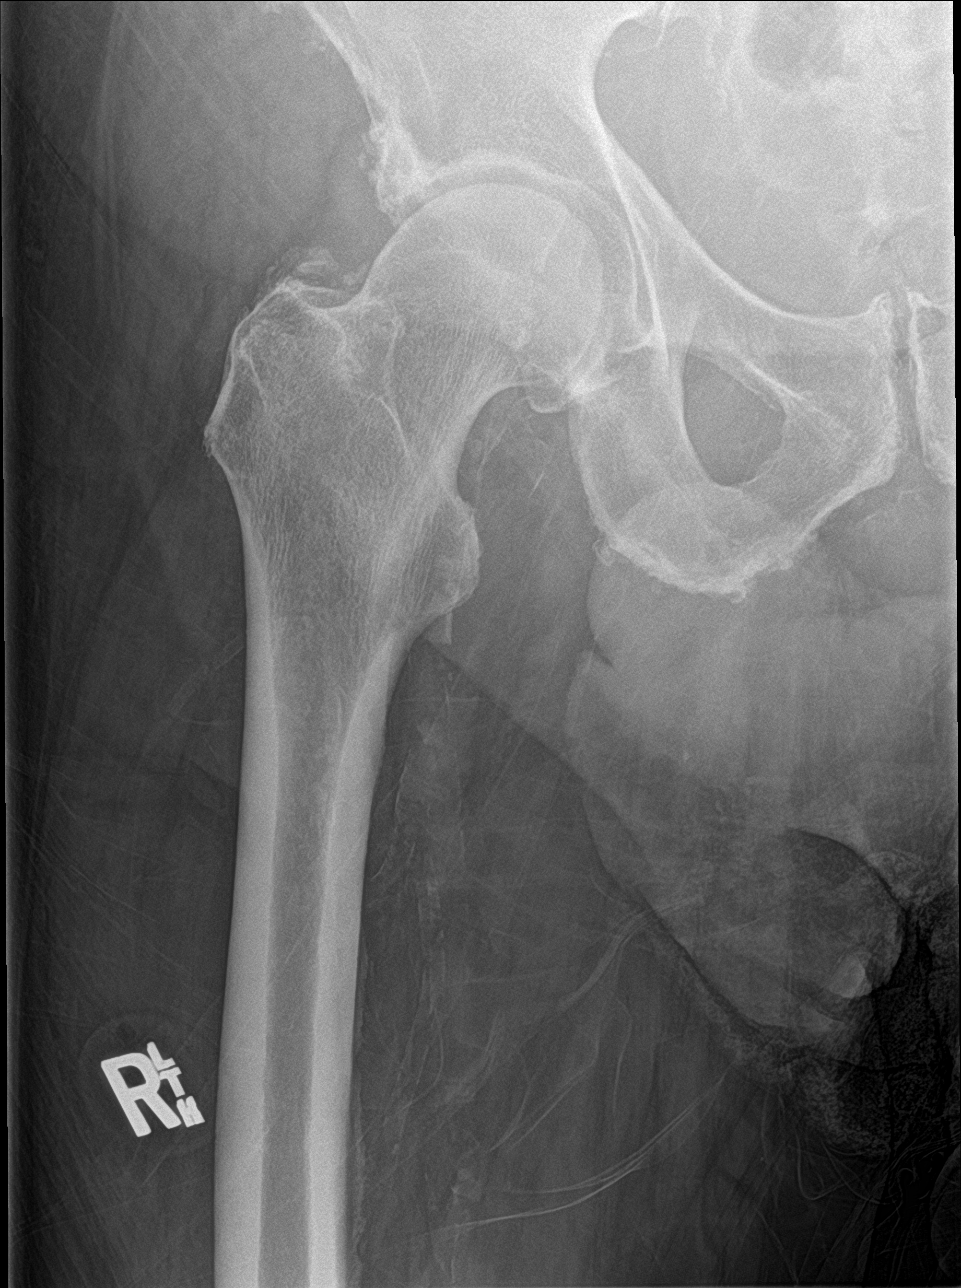

[hip frog leg]
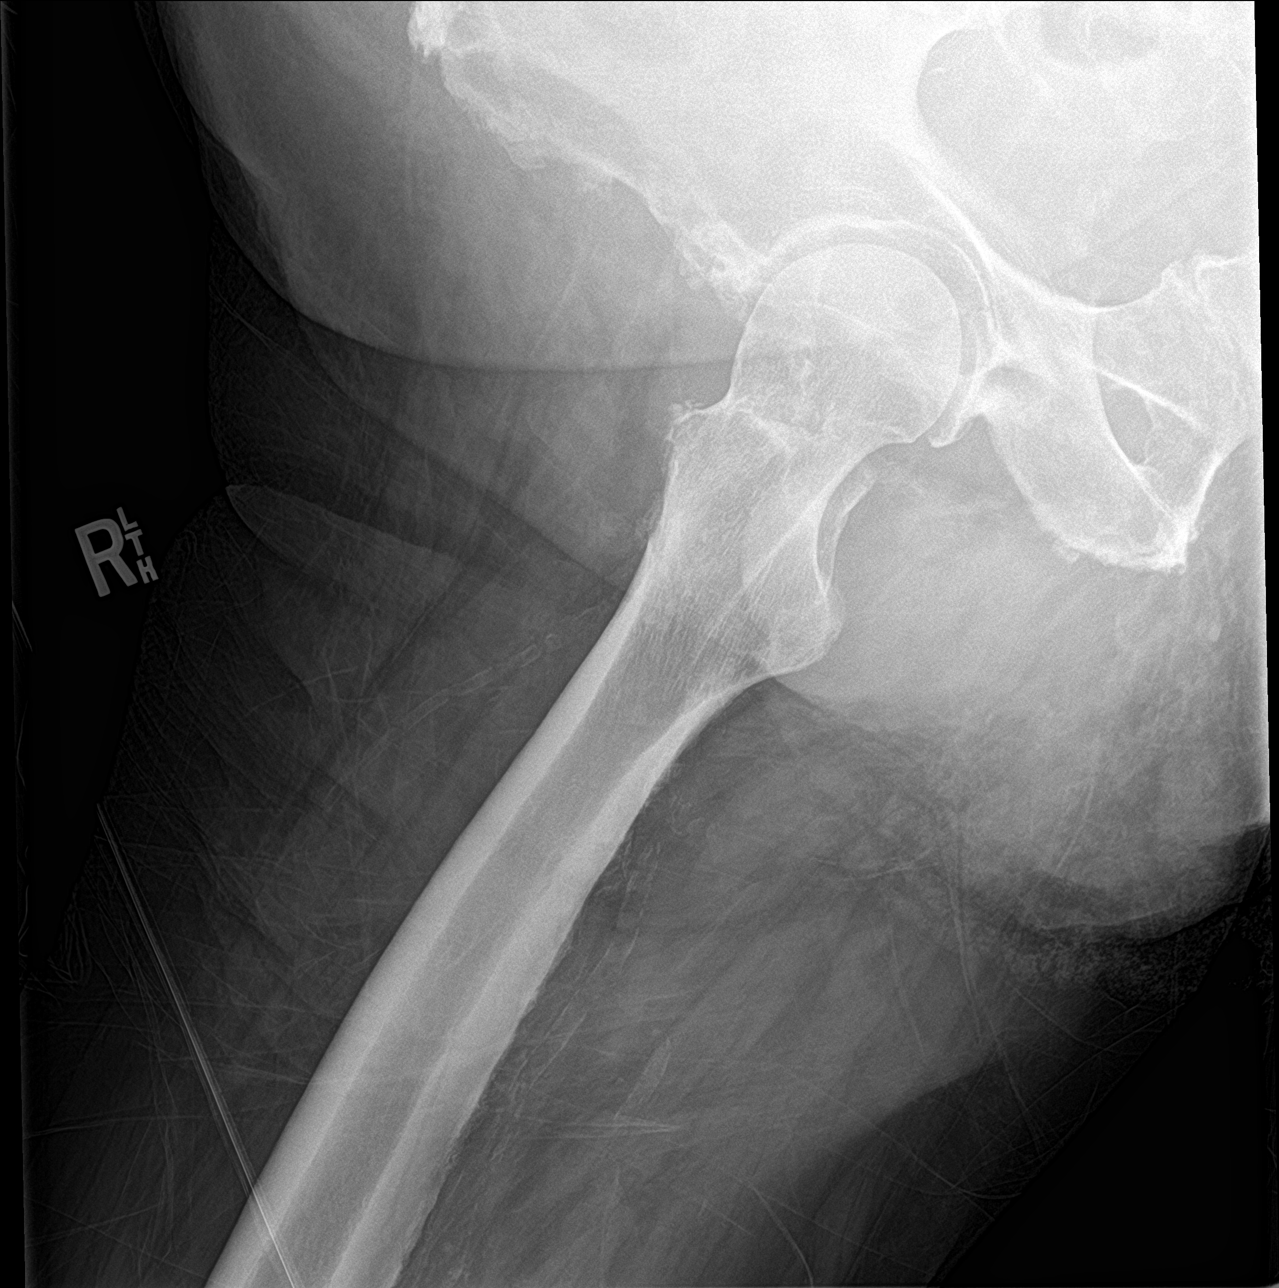

[3 of 3 positions shown; findings below may reference images not displayed]

FINDINGS: There is no evidence of hip fracture or dislocation. There is no
evidence of inflammatory arthropathy or bone lesion. Generalized
enthesophyte formation. Mild bilateral hip spurring. Osteopenia and
atherosclerosis.
IMPRESSION: No acute or focal finding.

## 2021-07-22 IMAGING — DX RIGHT FOOT COMPLETE - 3+ VIEW
4 series · 4 of 4 positions shown · non-contrast
Comparison: 05/29/2018 MRI.

CLINICAL DATA: Great toe pain and swelling.

EXAM:
RIGHT FOOT COMPLETE - 3+ VIEW

[foot ap]
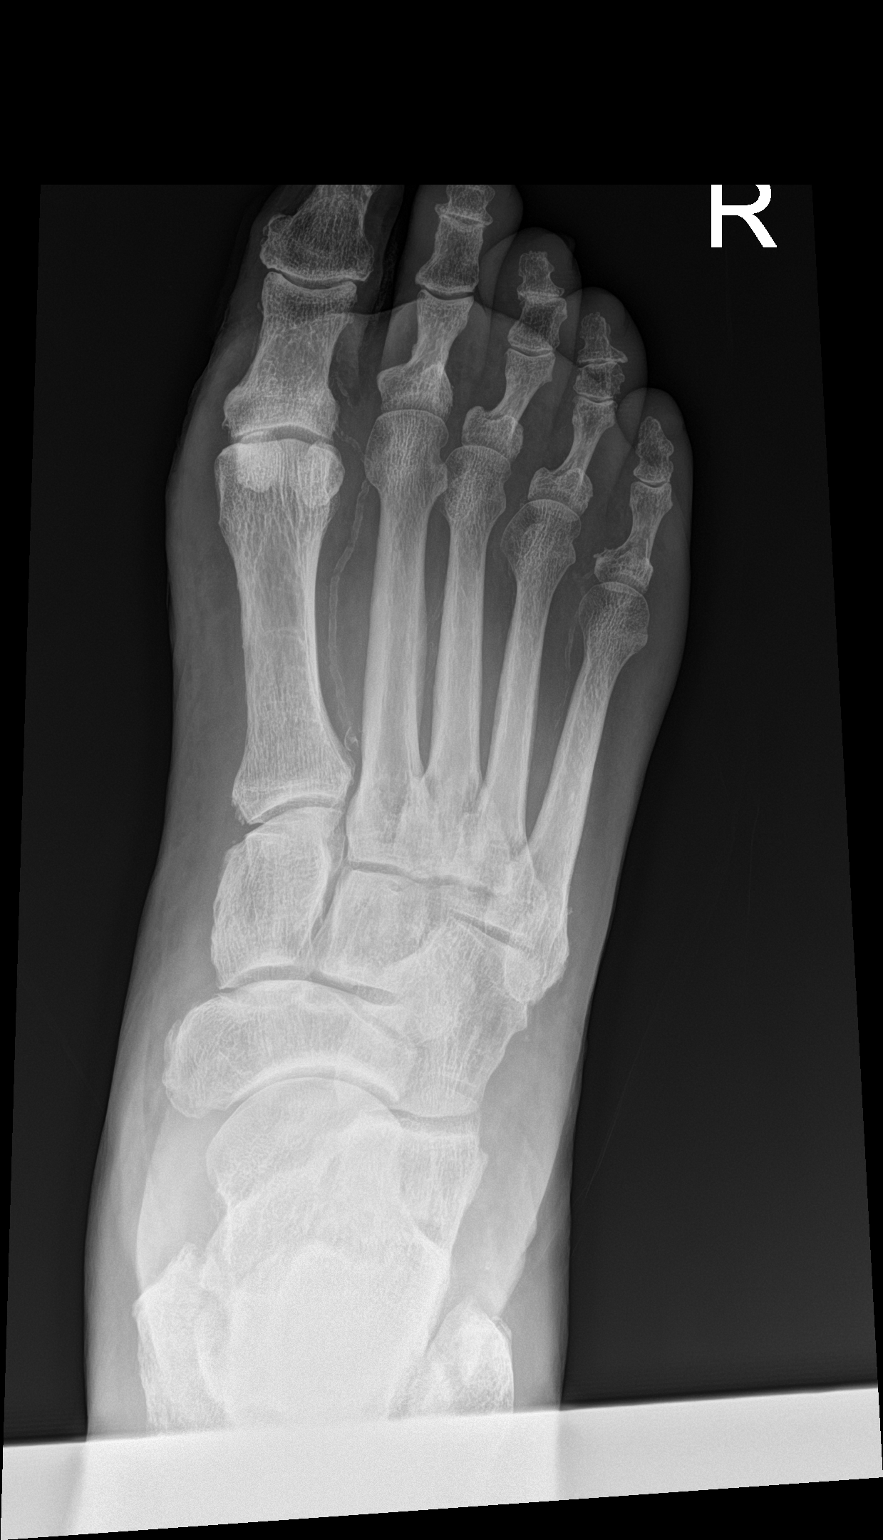

[foot obl]
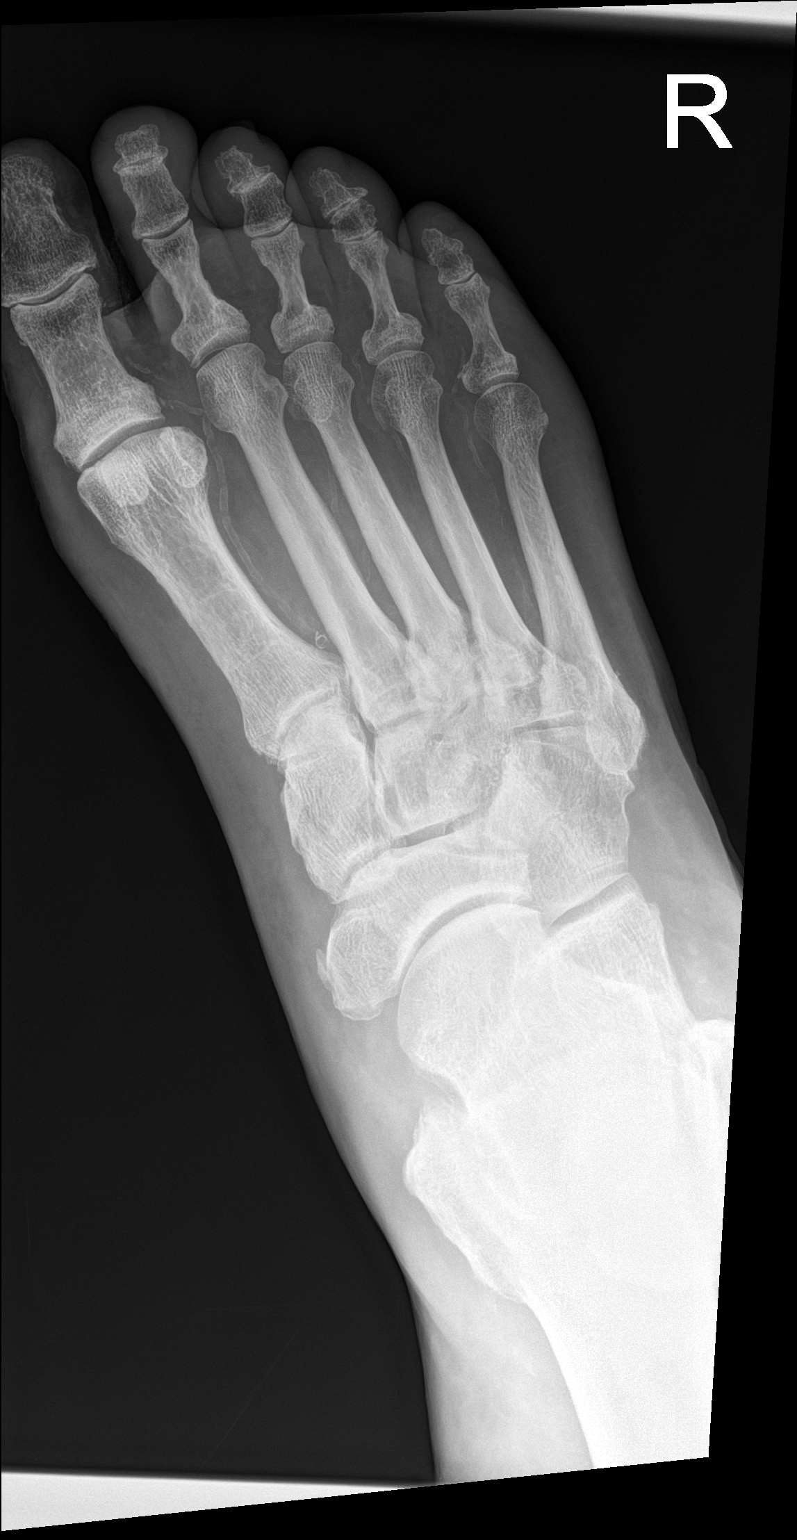

[foot lat (1 of 2)]
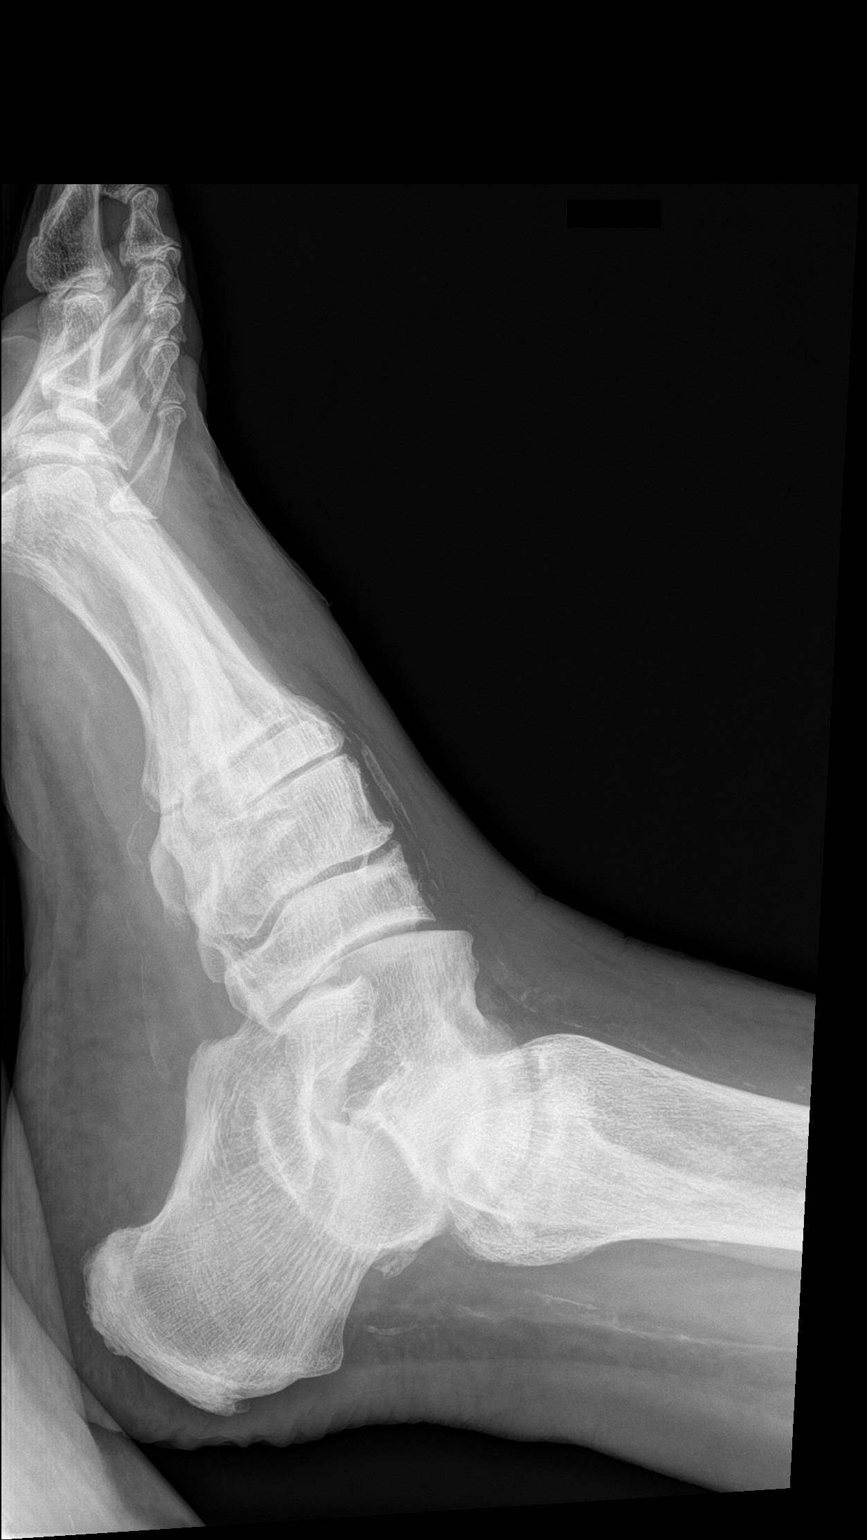

[foot lat (2 of 2)]
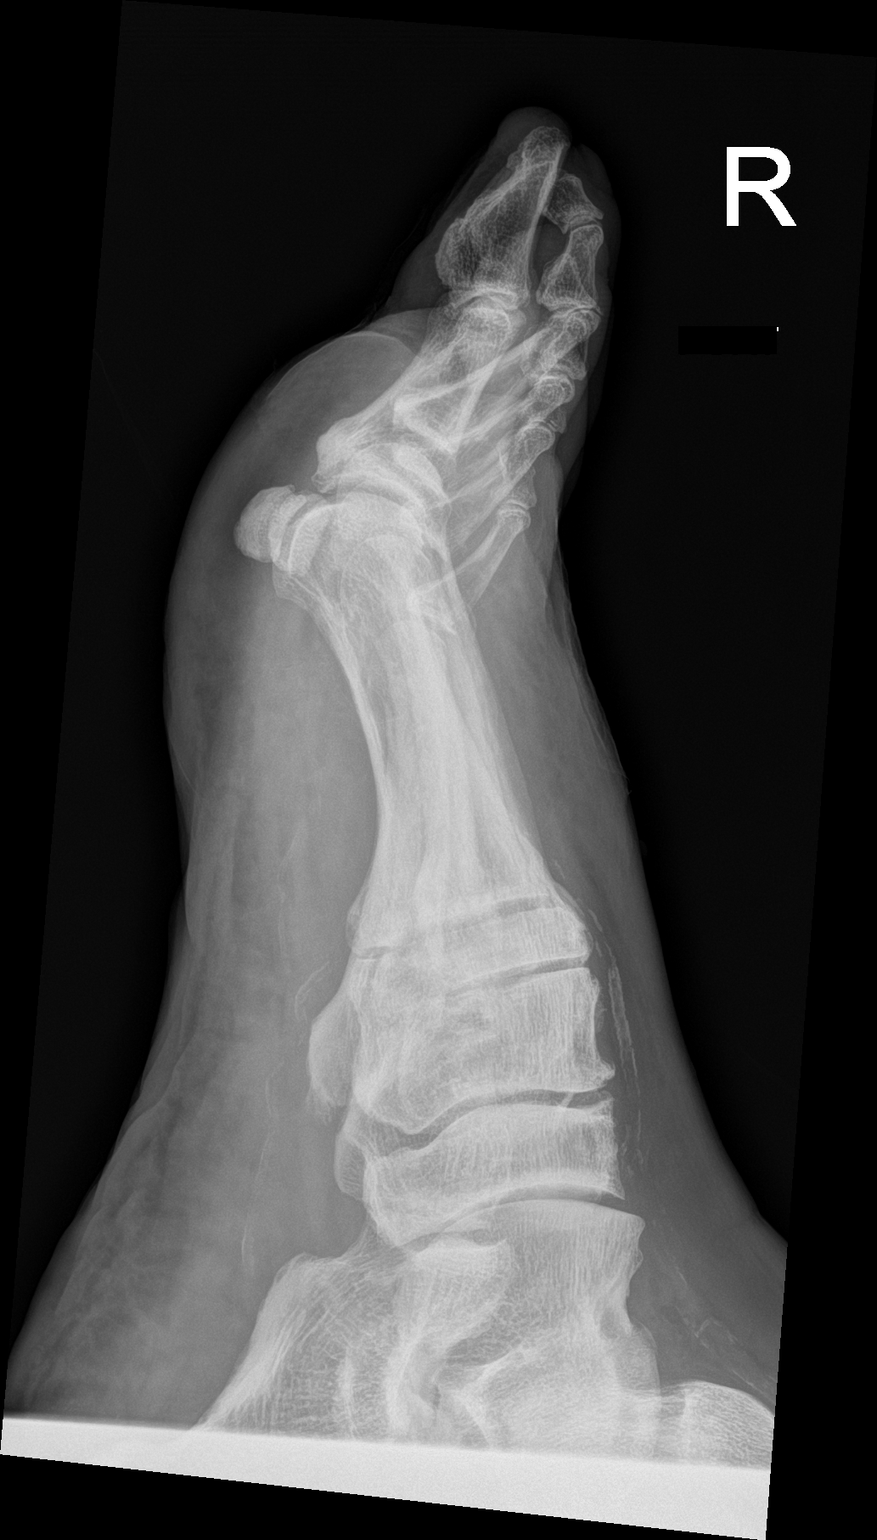

[4 of 4 positions shown; findings below may reference images not displayed]

FINDINGS: The bony structures are intact. I do not see any definite
destructive bony changes to suggest osteomyelitis. No definite
findings for septic arthritis. Extensive vascular calcifications are
noted.
IMPRESSION: No acute bony findings or destructive bony changes.
